# Patient Record
Sex: Male | Born: 1935 | Race: Black or African American | Hispanic: No | Marital: Married | Smoking: Former smoker
Health system: Southern US, Community
[De-identification: ages and names within clinical notes are randomized; demographics above are authoritative.]

## PROBLEM LIST (undated history)

## (undated) DIAGNOSIS — I2510 Atherosclerotic heart disease of native coronary artery without angina pectoris: Secondary | ICD-10-CM

## (undated) DIAGNOSIS — F329 Major depressive disorder, single episode, unspecified: Secondary | ICD-10-CM

## (undated) DIAGNOSIS — I151 Hypertension secondary to other renal disorders: Secondary | ICD-10-CM

## (undated) DIAGNOSIS — D179 Benign lipomatous neoplasm, unspecified: Secondary | ICD-10-CM

## (undated) DIAGNOSIS — E1151 Type 2 diabetes mellitus with diabetic peripheral angiopathy without gangrene: Secondary | ICD-10-CM

## (undated) DIAGNOSIS — K219 Gastro-esophageal reflux disease without esophagitis: Secondary | ICD-10-CM

## (undated) DIAGNOSIS — I10 Essential (primary) hypertension: Secondary | ICD-10-CM

## (undated) DIAGNOSIS — M1990 Unspecified osteoarthritis, unspecified site: Secondary | ICD-10-CM

## (undated) DIAGNOSIS — I701 Atherosclerosis of renal artery: Secondary | ICD-10-CM

## (undated) DIAGNOSIS — Z8719 Personal history of other diseases of the digestive system: Secondary | ICD-10-CM

## (undated) DIAGNOSIS — E538 Deficiency of other specified B group vitamins: Secondary | ICD-10-CM

## (undated) DIAGNOSIS — F32A Depression, unspecified: Secondary | ICD-10-CM

## (undated) DIAGNOSIS — K559 Vascular disorder of intestine, unspecified: Secondary | ICD-10-CM

## (undated) DIAGNOSIS — K279 Peptic ulcer, site unspecified, unspecified as acute or chronic, without hemorrhage or perforation: Secondary | ICD-10-CM

## (undated) DIAGNOSIS — J189 Pneumonia, unspecified organism: Secondary | ICD-10-CM

## (undated) DIAGNOSIS — E785 Hyperlipidemia, unspecified: Secondary | ICD-10-CM

## (undated) DIAGNOSIS — M8580 Other specified disorders of bone density and structure, unspecified site: Secondary | ICD-10-CM

## (undated) DIAGNOSIS — C61 Malignant neoplasm of prostate: Secondary | ICD-10-CM

## (undated) DIAGNOSIS — I5032 Chronic diastolic (congestive) heart failure: Secondary | ICD-10-CM

## (undated) DIAGNOSIS — N189 Chronic kidney disease, unspecified: Secondary | ICD-10-CM

## (undated) DIAGNOSIS — D469 Myelodysplastic syndrome, unspecified: Secondary | ICD-10-CM

## (undated) DIAGNOSIS — A0472 Enterocolitis due to Clostridium difficile, not specified as recurrent: Secondary | ICD-10-CM

## (undated) DIAGNOSIS — M109 Gout, unspecified: Secondary | ICD-10-CM

## (undated) DIAGNOSIS — I739 Peripheral vascular disease, unspecified: Secondary | ICD-10-CM

## (undated) DIAGNOSIS — I82409 Acute embolism and thrombosis of unspecified deep veins of unspecified lower extremity: Secondary | ICD-10-CM

## (undated) DIAGNOSIS — I495 Sick sinus syndrome: Secondary | ICD-10-CM

## (undated) DIAGNOSIS — K3184 Gastroparesis: Secondary | ICD-10-CM

## (undated) DIAGNOSIS — I4891 Unspecified atrial fibrillation: Secondary | ICD-10-CM

## (undated) DIAGNOSIS — Z950 Presence of cardiac pacemaker: Secondary | ICD-10-CM

## (undated) DIAGNOSIS — K648 Other hemorrhoids: Secondary | ICD-10-CM

## (undated) HISTORY — DX: Atherosclerotic heart disease of native coronary artery without angina pectoris: I25.10

## (undated) HISTORY — DX: Other hemorrhoids: K64.8

## (undated) HISTORY — PX: HIATAL HERNIA REPAIR: SHX195

## (undated) HISTORY — DX: Myelodysplastic syndrome, unspecified: D46.9

## (undated) HISTORY — DX: Deficiency of other specified B group vitamins: E53.8

## (undated) HISTORY — DX: Hyperlipidemia, unspecified: E78.5

## (undated) HISTORY — DX: Major depressive disorder, single episode, unspecified: F32.9

## (undated) HISTORY — DX: Chronic kidney disease, unspecified: N18.9

## (undated) HISTORY — DX: Atherosclerosis of renal artery: I70.1

## (undated) HISTORY — DX: Vascular disorder of intestine, unspecified: K55.9

## (undated) HISTORY — DX: Other specified disorders of bone density and structure, unspecified site: M85.80

## (undated) HISTORY — DX: Essential (primary) hypertension: I10

## (undated) HISTORY — DX: Unspecified atrial fibrillation: I48.91

## (undated) HISTORY — DX: Acute embolism and thrombosis of unspecified deep veins of unspecified lower extremity: I82.409

## (undated) HISTORY — DX: Sick sinus syndrome: I49.5

## (undated) HISTORY — DX: Chronic diastolic (congestive) heart failure: I50.32

## (undated) HISTORY — DX: Gastroparesis: K31.84

## (undated) HISTORY — DX: Gout, unspecified: M10.9

## (undated) HISTORY — DX: Malignant neoplasm of prostate: C61

## (undated) HISTORY — DX: Depression, unspecified: F32.A

## (undated) HISTORY — DX: Type 2 diabetes mellitus with diabetic peripheral angiopathy without gangrene: E11.51

## (undated) HISTORY — DX: Gastro-esophageal reflux disease without esophagitis: K21.9

## (undated) HISTORY — DX: Hypertension secondary to other renal disorders: I15.1

## (undated) HISTORY — DX: Peripheral vascular disease, unspecified: I73.9

## (undated) HISTORY — DX: Benign lipomatous neoplasm, unspecified: D17.9

## (undated) HISTORY — DX: Enterocolitis due to Clostridium difficile, not specified as recurrent: A04.72

---

## 1967-07-13 HISTORY — PX: PARTIAL GASTRECTOMY: SHX2172

## 1989-07-12 HISTORY — PX: APPENDECTOMY: SHX54

## 1993-01-22 HISTORY — PX: CORONARY ARTERY BYPASS GRAFT: SHX141

## 1994-10-18 HISTORY — PX: PACEMAKER INSERTION: SHX728

## 1995-07-13 DIAGNOSIS — C61 Malignant neoplasm of prostate: Secondary | ICD-10-CM

## 1995-07-13 HISTORY — DX: Malignant neoplasm of prostate: C61

## 1998-04-08 ENCOUNTER — Encounter: Payer: Self-pay | Admitting: Gastroenterology

## 1998-04-08 ENCOUNTER — Ambulatory Visit (HOSPITAL_COMMUNITY): Admission: RE | Admit: 1998-04-08 | Discharge: 1998-04-08 | Payer: Self-pay | Admitting: Gastroenterology

## 1998-07-30 ENCOUNTER — Encounter: Payer: Self-pay | Admitting: Gastroenterology

## 1998-07-30 ENCOUNTER — Ambulatory Visit (HOSPITAL_COMMUNITY): Admission: RE | Admit: 1998-07-30 | Discharge: 1998-07-30 | Payer: Self-pay | Admitting: Gastroenterology

## 1998-10-07 ENCOUNTER — Ambulatory Visit (HOSPITAL_COMMUNITY): Admission: RE | Admit: 1998-10-07 | Discharge: 1998-10-07 | Payer: Self-pay | Admitting: Cardiology

## 1998-10-07 ENCOUNTER — Encounter: Payer: Self-pay | Admitting: Cardiology

## 1999-05-07 ENCOUNTER — Ambulatory Visit: Admission: RE | Admit: 1999-05-07 | Discharge: 1999-05-07 | Payer: Self-pay | Admitting: Cardiology

## 1999-05-07 ENCOUNTER — Encounter: Payer: Self-pay | Admitting: Cardiology

## 2000-04-14 ENCOUNTER — Ambulatory Visit (HOSPITAL_COMMUNITY): Admission: RE | Admit: 2000-04-14 | Discharge: 2000-04-14 | Payer: Self-pay | Admitting: Cardiology

## 2000-04-14 ENCOUNTER — Encounter: Payer: Self-pay | Admitting: Cardiology

## 2000-04-21 ENCOUNTER — Ambulatory Visit (HOSPITAL_COMMUNITY): Admission: RE | Admit: 2000-04-21 | Discharge: 2000-04-21 | Payer: Self-pay | Admitting: Cardiology

## 2000-08-25 ENCOUNTER — Encounter: Admission: RE | Admit: 2000-08-25 | Discharge: 2000-08-25 | Payer: Self-pay | Admitting: Gastroenterology

## 2000-08-25 ENCOUNTER — Encounter: Payer: Self-pay | Admitting: Gastroenterology

## 2001-05-31 ENCOUNTER — Encounter: Admission: RE | Admit: 2001-05-31 | Discharge: 2001-08-29 | Payer: Self-pay | Admitting: Cardiology

## 2002-09-29 ENCOUNTER — Observation Stay (HOSPITAL_COMMUNITY): Admission: EM | Admit: 2002-09-29 | Discharge: 2002-09-30 | Payer: Self-pay | Admitting: Emergency Medicine

## 2002-09-29 ENCOUNTER — Encounter: Payer: Self-pay | Admitting: Emergency Medicine

## 2003-01-03 ENCOUNTER — Emergency Department (HOSPITAL_COMMUNITY): Admission: EM | Admit: 2003-01-03 | Discharge: 2003-01-03 | Payer: Self-pay | Admitting: Emergency Medicine

## 2003-01-08 ENCOUNTER — Ambulatory Visit (HOSPITAL_COMMUNITY): Admission: RE | Admit: 2003-01-08 | Discharge: 2003-01-08 | Payer: Self-pay | Admitting: Gastroenterology

## 2003-01-22 ENCOUNTER — Encounter: Payer: Self-pay | Admitting: Gastroenterology

## 2003-01-22 ENCOUNTER — Encounter: Admission: RE | Admit: 2003-01-22 | Discharge: 2003-01-22 | Payer: Self-pay | Admitting: Gastroenterology

## 2003-12-10 ENCOUNTER — Ambulatory Visit (HOSPITAL_COMMUNITY): Admission: RE | Admit: 2003-12-10 | Discharge: 2003-12-10 | Payer: Self-pay | Admitting: Cardiology

## 2003-12-10 HISTORY — PX: PACEMAKER GENERATOR CHANGE: SHX5998

## 2007-07-13 DIAGNOSIS — D179 Benign lipomatous neoplasm, unspecified: Secondary | ICD-10-CM

## 2007-07-13 HISTORY — DX: Benign lipomatous neoplasm, unspecified: D17.9

## 2008-02-07 ENCOUNTER — Ambulatory Visit (HOSPITAL_COMMUNITY): Admission: RE | Admit: 2008-02-07 | Discharge: 2008-02-07 | Payer: Self-pay | Admitting: Cardiology

## 2008-04-11 HISTORY — PX: CHOLECYSTECTOMY: SHX55

## 2008-04-16 ENCOUNTER — Encounter (INDEPENDENT_AMBULATORY_CARE_PROVIDER_SITE_OTHER): Payer: Self-pay | Admitting: Surgery

## 2008-04-16 ENCOUNTER — Ambulatory Visit (HOSPITAL_COMMUNITY): Admission: RE | Admit: 2008-04-16 | Discharge: 2008-04-18 | Payer: Self-pay | Admitting: Surgery

## 2009-03-06 ENCOUNTER — Encounter: Admission: RE | Admit: 2009-03-06 | Discharge: 2009-03-06 | Payer: Self-pay | Admitting: Family Medicine

## 2009-05-14 ENCOUNTER — Ambulatory Visit (HOSPITAL_COMMUNITY): Admission: RE | Admit: 2009-05-14 | Discharge: 2009-05-14 | Payer: Self-pay | Admitting: Urology

## 2009-06-20 ENCOUNTER — Ambulatory Visit (HOSPITAL_COMMUNITY): Admission: RE | Admit: 2009-06-20 | Discharge: 2009-06-20 | Payer: Self-pay | Admitting: Urology

## 2009-07-12 DIAGNOSIS — I82409 Acute embolism and thrombosis of unspecified deep veins of unspecified lower extremity: Secondary | ICD-10-CM

## 2009-07-12 HISTORY — DX: Acute embolism and thrombosis of unspecified deep veins of unspecified lower extremity: I82.409

## 2009-10-20 ENCOUNTER — Ambulatory Visit: Payer: Self-pay | Admitting: Diagnostic Radiology

## 2009-10-20 ENCOUNTER — Ambulatory Visit (HOSPITAL_BASED_OUTPATIENT_CLINIC_OR_DEPARTMENT_OTHER): Admission: RE | Admit: 2009-10-20 | Discharge: 2009-10-20 | Payer: Self-pay | Admitting: Emergency Medicine

## 2009-12-25 ENCOUNTER — Encounter: Admission: RE | Admit: 2009-12-25 | Discharge: 2009-12-25 | Payer: Self-pay | Admitting: Family Medicine

## 2010-02-02 ENCOUNTER — Ambulatory Visit: Payer: Self-pay | Admitting: Surgery

## 2010-02-02 ENCOUNTER — Inpatient Hospital Stay (HOSPITAL_COMMUNITY): Admission: RE | Admit: 2010-02-02 | Discharge: 2010-02-05 | Payer: Self-pay | Admitting: Surgery

## 2010-02-02 HISTORY — PX: THROMBECTOMY / EMBOLECTOMY SUBCLAVIAN ARTERY: SUR1355

## 2010-02-04 ENCOUNTER — Ambulatory Visit: Payer: Self-pay | Admitting: Surgery

## 2010-02-23 ENCOUNTER — Ambulatory Visit: Payer: Self-pay | Admitting: Surgery

## 2010-02-26 ENCOUNTER — Encounter: Admission: RE | Admit: 2010-02-26 | Discharge: 2010-02-26 | Payer: Self-pay | Admitting: Surgery

## 2010-09-26 LAB — CBC
HCT: 29.5 % — ABNORMAL LOW (ref 39.0–52.0)
Hemoglobin: 9.6 g/dL — ABNORMAL LOW (ref 13.0–17.0)
Hemoglobin: 9.8 g/dL — ABNORMAL LOW (ref 13.0–17.0)
MCH: 33.7 pg (ref 26.0–34.0)
MCH: 33.9 pg (ref 26.0–34.0)
MCH: 34.1 pg — ABNORMAL HIGH (ref 26.0–34.0)
MCHC: 33 g/dL (ref 30.0–36.0)
MCHC: 33.1 g/dL (ref 30.0–36.0)
MCHC: 33.3 g/dL (ref 30.0–36.0)
MCV: 102.8 fL — ABNORMAL HIGH (ref 78.0–100.0)
Platelets: 129 10*3/uL — ABNORMAL LOW (ref 150–400)
Platelets: 130 10*3/uL — ABNORMAL LOW (ref 150–400)
RDW: 14.7 % (ref 11.5–15.5)
RDW: 14.7 % (ref 11.5–15.5)
WBC: 8.3 10*3/uL (ref 4.0–10.5)

## 2010-09-26 LAB — GLUCOSE, CAPILLARY
Glucose-Capillary: 105 mg/dL — ABNORMAL HIGH (ref 70–99)
Glucose-Capillary: 111 mg/dL — ABNORMAL HIGH (ref 70–99)
Glucose-Capillary: 112 mg/dL — ABNORMAL HIGH (ref 70–99)
Glucose-Capillary: 114 mg/dL — ABNORMAL HIGH (ref 70–99)
Glucose-Capillary: 149 mg/dL — ABNORMAL HIGH (ref 70–99)
Glucose-Capillary: 171 mg/dL — ABNORMAL HIGH (ref 70–99)

## 2010-09-26 LAB — BASIC METABOLIC PANEL
BUN: 20 mg/dL (ref 6–23)
BUN: 5 mg/dL — ABNORMAL LOW (ref 6–23)
BUN: 5 mg/dL — ABNORMAL LOW (ref 6–23)
CO2: 26 mEq/L (ref 19–32)
CO2: 28 mEq/L (ref 19–32)
CO2: 29 mEq/L (ref 19–32)
Calcium: 8.4 mg/dL (ref 8.4–10.5)
Calcium: 8.8 mg/dL (ref 8.4–10.5)
Calcium: 8.9 mg/dL (ref 8.4–10.5)
Chloride: 109 mEq/L (ref 96–112)
Creatinine, Ser: 0.82 mg/dL (ref 0.4–1.5)
Creatinine, Ser: 1.18 mg/dL (ref 0.4–1.5)
Glucose, Bld: 119 mg/dL — ABNORMAL HIGH (ref 70–99)
Glucose, Bld: 124 mg/dL — ABNORMAL HIGH (ref 70–99)
Glucose, Bld: 138 mg/dL — ABNORMAL HIGH (ref 70–99)
Sodium: 141 mEq/L (ref 135–145)
Sodium: 142 mEq/L (ref 135–145)

## 2010-09-26 LAB — PROTIME-INR: INR: 1.12 (ref 0.00–1.49)

## 2010-09-26 LAB — HEPARIN LEVEL (UNFRACTIONATED)
Heparin Unfractionated: 0.25 IU/mL — ABNORMAL LOW (ref 0.30–0.70)
Heparin Unfractionated: 0.47 IU/mL (ref 0.30–0.70)
Heparin Unfractionated: 0.6 IU/mL (ref 0.30–0.70)

## 2010-11-24 NOTE — Op Note (Signed)
[name], [name]              ACCOUNT NO.:  [id number]   MEDICAL RECORD NO.:  [id number]          PATIENT TYPE:  OIB   LOCATION:  1615                         FACILITY:  [facility]   PHYSICIAN:  Ardeth Sportsman, MD     DATE OF BIRTH:  12/12/1935   DATE OF PROCEDURE:  04/16/2008  DATE OF DISCHARGE:                               OPERATIVE REPORT   CCS #:  [id number]   PRIMARY CARE PHYSICIAN:  Vikki Ports, M.D.   REFERRING PHYSICIAN:  Dr. Donnita Falls L. Randa Evens, M.D. with [facility].   SURGEON:  Ardeth Sportsman, MD.   ASSISTANT:  RN first assistant.   PREOPERATIVE DIAGNOSES:  1. Biliary colic, probable chronic cholecystitis.  2. Upper abdominal pain, question of incisional hernia status post      open paraesophageal hernia repair.  3. Umbilical hernia, symptomatic but reducible.   POSTOPERATIVE DIAGNOSES:  1. Biliary colic, probable chronic cholecystitis.  2. Upper midline intra-abdominal adhesions but no evidence of any      major incisional ventral hernia.  3. Umbilical hernia, symptomatic but reducible.   PROCEDURES PERFORMED:  1. Laparoscopic lysis of adhesions x45 minutes (half a case).  2. Laparoscopic cholecystectomy with intraoperative cholangiogram.  3. Primary umbilical hernia repair with 0 Vicryl figure of eight      stitch x2.   ANESTHESIA:  1. General anesthesia.  2. Local anesthetic and field block at all port sites.   SPECIMENS:  Gallbladder.   DRAINS:  None.   ESTIMATED BLOOD LOSS:  Less than 30 mL.   COMPLICATIONS:  None apparent.   INDICATIONS:  [name] is a pleasant 75 year old male who has had  worsening abdominal pain that seems to be biliary colic in some ways.  He has also had some upper abdominal pain that is related to his prior  large incision for his open paraesophageal hernia repair.   Anatomy and physiology of the digestive tract was explained as well as  hepatobiliary and pancreatic function was discussed.   Pathophysiology of  cholecystolithiasis and biliary dyskinesia with it's natural history and  risks were discussed.  Options were discussed.  Recommendations made for  diagnostic laparoscopy with lysis adhesions and a cholecystectomy with  an intraoperative cholangiogram.  Operative recommendation is also made  with lysis adhesions to make sure he did not have any evidence of  incisional hernias beyond his probable umbilical hernia as well.  Recommendation made if hernias existed to try and repair them if it  looked like a clean environment.  Risks, benefits, alternatives,  discussed. Questions answered and  he agreed to proceed.   OPERATIVE FINDINGS:  1. He had moderate adhesions of his omentum and mesocolon and some      duodenal adhesions to the gallbladder consistent with chronic      cholecystitis.  His cholangiogram showed a dilated common bile duct      but no evidence of any stones and good flush down to the duodenum      arguing against any obstruction.  2. He had moderate abdominal adhesions, primarily of omentum but  some      stomach and small bowel to his midline.  There is no evidence of      any supraumbilical midline incisional hernias.  3. He did have a 1.5 cm umbilical hernia incarcerated with omentum but      ultimately reducible.   DESCRIPTION OF PROCEDURE:  Informed consent was confirmed.  Patient  underwent general anesthesia without any difficulty.  He had a Foley  catheter sterilely placed.  He received IV  cephazolin just prior to  surgery.  He had sequential compression devices active during the entire  case.  He was positioned supine with both arms tucked.   A 5-0 port was placed in the right upper quadrant with the patient in  steep reverse Trendelenburg right side up using optical entry technique.  Camera inspection revealed no intraabdominal injury.  Under direct  visualization 5-0 port was placed in the right flank.  Camera inspection  revealed moderate  dense adhesions along the midline.  These were freed  up using a focused cautery and primarily cold sharp dissection scissors.  Once I freed all that I was able to free part of the liver off.  He did  have some adhesions of his stomach along the greater curvature of the  stomach to the anterior abdominal wall and I left those alone.  Left  lateral sphincter and his bladder had some mild adhesions I had freed  off to be sure to see all the way up the xyphoid.  There was no evidence  of any hernia along there except for  down at the base near the  umbilicus he did have an obvious hernia.  It had incarcerated with some  omentum though I was able to reduce it.   Attention was turned toward the cholecystectomy.  I went in and placed a  10 port through the umbilical hernia and another 10 port in the sub-  xyphoid region tunneled through the falciform ligament.   The gallbladder fundus was grasped near the cephalad.  Peritoneal  coverings for the anterior, medial and posterolateral walls of the  gallbladder and the liver was freed off.  Moderate mesorectal and  omental adhesions were freed off using careful blunt and focused cautery  dissection.  The duodenum was able to be swept off without having to use  any cautery.   Circumferential dissection is done on the proximal half of the  gallbladder to free it off.  A pulsatile structure going  to the  anteromedial wall and the smaller one going to the posterolateral wall  consistent with an anterior and posterior branch of cystic artery was  noted.  Further dissection was done to get a good critical view.  One  clip on the gallbladder side and 2 slightly proximal were placed on each  branch and they were transected.  I did not try and take the cystic node  with it.   This has left one structure going from the gallbladder down to the porta  hepatitis consistent with the cystic duct.  Two clips are placed in the  infundibulum and a partial  cystectomy was performed.  A 5 French  cholangiogram catheter was passed through right subcostal stab incision  flushed and placed in the cystic duct.  A cholangiogram was run using  dilute radio-opaque contrast and continuousfluoroscopy.  Contrast flowed  well from the side branch consistent with cystic duct cannulization.  The entry into the common bile duct was a little more distal than  average and seemed to pass anteriorly and have an insertion more on the  left side.  Contrast flowed well into a dilated common bile duct and  reflexed well into right and left intrahepatic chains.  Contrast easily  flowed down a smooth ampulla into the duodenum consistent with a normal  cholangiogram except for some mild common bile duct dilatation.  Cholangiocatheter was moved.  Four clips were placed on the cystic duct  slightly proximal to cystic ductotomy and cystic duct transection was  completed.   Gallbladder was freed of its remaining  attachments on liver bed are  removed in an EndoCatch bag.  During dissection there was some spillage  of bile but no stones.  When I pulled the bag the umbilical portit  had  a hernia within it and it came out rather easily.   Copious irrigation in over a liter of saline was done with clear return.  Hemostasis was good on the liver bed.  Bile was aspirated and washed out  well.  Clips on the cystic duct and cystic branch arteriole stumps were  intact.   The fascial defect of the subxyphoid region was large enough to allow my  pinky to pass.  I went in and used a laparoscopic suture passer to pass  a 0 Vicryl tie in a figure of eight fashion under direct visualization.  Capnoperitoneum was evacuated and ports were removed.  The umbilical  incision was opened a little more widely in a transverse curvilinear  fashion.  Borders of the  umbilical hernia were better defined.  The  hernia was closed using a 0 Vicryl figure of eight stitch x2 to good  result.  I  did not place mesh in since there had been some spillage of  bile and it was not larger than 2 cm.  I figured the risk of mesh  infection was greater than the risk of recurrent hernia.   Patient was extubated and sent to recovery room in stable condition.  I  discussed postoperative care with the patient.  I am about to discuss it  with his wife as well per his request.      Ardeth Sportsman, MD  Electronically Signed     SCG/MEDQ  D:  04/16/2008  T:  04/16/2008  Job:  [id number]   cc:   Vikki Ports, M.D.  Fax: [phone]   Llana Aliment. Malon Kindle., M.D.  Fax: [phone]   Anna Genre Little, M.D.  Fax: [phone]

## 2010-11-24 NOTE — Assessment & Plan Note (Signed)
OFFICE VISIT   PASCAL, STIGGERS  DOB:  March 01, 1936                                       02/23/2010  EAVWU#:[id number]   Mr. Pitera returns today with abdominal pain which was postprandial.  He has had a significant weight loss, approximately 70 pounds.  When I  saw him in the office, he had developed a swollen right arm.  We  admitted to the hospital.  A venogram the arm revealed a high-grade  stenosis at the end of pacemaker insertion site. Mechanical thrombectomy  was performed with AngioJet and the patient was placed on Coumadin.  He  has had significant improvement in right arm swelling.  Later that same  hospitalization, he went for arteriogram.  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  The patient had immediate improvement in his of postprandial  abdominal pain and was discharged home.  He comes back today because for  the last several days, he has had some mild pain with eating.  It does  not occur with every meal and the onset is a little bit later from his  meal than it was previously described.   On examination, his heart rate is 70, his blood pressure is 123/71.  His  temperature is 98.4.  He is well-appearing. In no distress.  His abdomen  is soft, somewhat tender to palpation in the right upper quadrant.  Respirations are nonlabored.   ASSESSMENT/PLAN:  1. Right arm swelling:  The patient was seen by Dr. Garnette Scheuermann, his      cardiologist while hospital.  He has elected to treat the stenosis      at his pacemaker insertion site with anticoagulation.  The patient      doing very well from this perspective at this time.  He has follow-      up with Dr. [name] this coming Wednesday.  2. Mesenteric stenosis:  Based on the patient's return of abdominal      pain although not quite as severe, I think this warrants further      diagnostic imaging.  I feel we get her best information  with a CT      angiogram.  I am going to have that performed this week and I will      contact the patient with the results.     Jorge Ny, MD  Electronically Signed   VWB/MEDQ  D:  02/23/2010  T:  02/24/2010  Job:  [id number]

## 2010-11-24 NOTE — Procedures (Signed)
DUPLEX DEEP VENOUS EXAM - UPPER EXTREMITY   INDICATION:  Pain, swelling.   HISTORY:  Edema:  Right arm.  Trauma/Surgery:  Right pacemaker.  Pain:  Yes.  PE:  No.  Previous DVT:  No.  Anticoagulants:  No.  Other:  No.   DUPLEX EXAM:                                             Bas/                IJV   SCV     AXV    BrachV  Ceph V                R  L  R   L   R  L   R   L   R  L  Thrombosis    o  o  +       +      +       +  Spontaneous   +  o  o       o      o       o  Phasic        +  +  o       o      o       o  Augmentation  +  +  o       o      o       o  Compressible  +  +  o       o      o       o  Competent     +  +  o       o      o       o  Legend:  + - yes  o - no  p - partial  D - decreased     IMPRESSION:  There appears to be thrombus noted in the right subclavian  vein, axillary vein, brachial vein, and basilic veins.   ___________________________________________  V. Charlena Cross, MD   CB/MEDQ  D:  02/02/2010  T:  02/02/2010  Job:  [id number]

## 2010-11-24 NOTE — Assessment & Plan Note (Signed)
OFFICE VISIT   NEWEL, OIEN D  DOB:  April 25, 1936                                       02/02/2010  JWJXB#:[id number]   REASON FOR VISIT:  Abdominal pain with eating.   HISTORY:  This is a 75 year old gentleman seen at request of Dr. Randa Evens  for evaluation of abdominal pain and weight loss.  The patient states  that for the past 6 months he has been having significant abdominal pain  after every meal.  Sometimes he states that this occurs immediately  after eating but always within the hour.  I get mixed results when  asking about his weight loss.  His family members state that he has lost  approximately 70 pounds.  The patient does not think it has been this  much.  However, his  appetite has been significantly limited.  He has  recently undergone a CT angiogram which showed atherosclerotic plaque at  the origin of his celiac and superior mesenteric artery.  There is also  stenosis at the origin of inferior mesenteric artery.  Patient has  undergone a colonoscopy in 2008 for heme positive stools which was  negative except for internal hemorrhoids.  The EGD showed a small hiatal  hernia but it was otherwise normal.   The patient's risk factors for atherosclerotic disease include diabetes  which he has had since age 39.  He also suffers hypertension and  hypercholesterolemia which are medically managed.  He does have a  pacemaker/AICD on the right.   The patient also complains today of swelling in his right arm which has  been present for the past 3 or 4 days.   REVIEW OF SYSTEMS:  GENERAL:  Positive weight loss as well as loss of  appetite.  Current weight is 168 pounds.  VASCULAR:  Positive for pain in legs with walking.  CARDIAC:  Positive for chest pain and shortness of breath with exertion.  GI:  Positive for abdominal pain.  NEURO:  Positive dizziness and headaches.  PULMONARY:  Negative.  HEME:  Positive for anemia.  GU is negative.  EENT:   Negative.  MUSCULOSKELETAL:  Positive for arthritis and muscle pain.  PSYCH:  Negative.  SKIN:  Negative.   PAST MEDICAL HISTORY:  Diabetes, hypertension, hypercholesterolemia,  congestive heart failure, prostate cancer, chronic abdominal pain, renal  insufficiency,   SOCIAL HISTORY:  He is married with 7 children.  Does not smoke, quit 10  years ago, does not drink.   FAMILY HISTORY:  Negative for cardiovascular disease at an early age.   ALLERGIES:  Ibuprofen.   PHYSICAL EXAMINATION:  Heart rate 88, blood pressure 127/80, O2 sats  98%.  GENERAL:  This is a cachectic-appearing man in no acute distress.  HEENT:  Within normal limits.  LUNGS:  Clear bilaterally.  CARDIOVASCULAR:  Regular rate and rhythm.  No carotid bruits.  ABDOMEN:  Soft with minimal tenderness.  No hernias.  No  hepatosplenomegaly.  MUSCULOSKELETAL:  Without major deformities except for swelling in the  entire right upper arm.  NEURO:  Without focal deficits.  SKIN:  No rash.   DIAGNOSTICS:  I have ordered and independently reviewed his right upper  arm ultrasound which shows a thrombosis in the right subclavian,  axillary, brachial and basilic veins.   ASSESSMENT/PLAN:  1. Right arm swelling:  I have spoke with  Dr. Irish Lack.  This      most likely represents subclavian vein thrombosis potentially from      the patient's existing pacemaker catheter.  Due to his significant      amount swelling, I would recommend proceeding with mechanical      thrombectomy and/or  thrombolysis, which should be at the      discretion of Dr. Fredia Sorrow who has agreed to accommodate this      patient today.  For that reason, the patient will be sent hospital      to have this addressed.  2. Abdominal pain:  Based on the patient's CT scan, chronic mesenteric      ischemia most likely is the etiology for his abdominal pain.  I      will plan on proceeding with abdominal angiography and possible      stenting of his  superior mesenteric artery once the subclavian vein      thrombosis has been resolved.  This could potentially be done this      week pending those results.  I will plan on accessing the left      groin as I have difficulty finding a pulse of the right groin.  The      risks and benefits of  percutaneous intervention of the mesenteric      vessels were discussed with the patient which included the need for      multiple dilations or possibly need for operative repair.     Jorge Ny, MD  Electronically Signed   VWB/MEDQ  D:  02/02/2010  T:  02/03/2010  Job:  [id number]

## 2010-11-27 NOTE — Op Note (Signed)
   NAME:  [name], [name]                     ACCOUNT NO.:  [id number]   MEDICAL RECORD NO.:  [id number]                   PATIENT TYPE:  AMB   LOCATION:  ENDO                                 FACILITY:  [facility]   PHYSICIAN:  James L. Malon Kindle., M.D.          DATE OF BIRTH:  1935-11-06   DATE OF PROCEDURE:  01/08/2003  DATE OF DISCHARGE:                                 OPERATIVE REPORT   PROCEDURE:  Colonoscopy.   MEDICATIONS:  Fentanyl 100 mcg and Versed 10 mg IV.   SCOPE:  Adult Olympus colonoscope.   INDICATIONS:  Colon cancer screening in an average risk individual.   DESCRIPTION OF PROCEDURE:  The patient had been explained to the patient and  consent obtained.  With the patient in the left lateral decubitus position,  the Olympus colonoscope was inserted and advanced.  We were able to advance  eventually to the cecum using abdominal pressure and position changes.  The  patient had a long tortuous colon.  The prep was somewhat poor, but we felt  that we got an adequate look.  There were no polyps seen.  Scattered  diverticula.  The scope was withdrawn.  The patient tolerated the procedure  well.  Maintained on low flow oxygen and pulse oximetry throughout the  procedure.   ASSESSMENT:  Normal screening colonoscopy.   PLAN:  Yearly Hemoccult.  Consider another colonoscopy in 5-10 years.                                               James L. Malon Kindle., M.D.    Waldron Session  D:  01/08/2003  T:  01/08/2003  Job:  [id number]   cc:   Dellis Anes. Idell Pickles, M.D.  [address]  [place]  [place] [address]  Fax: [phone]

## 2010-11-27 NOTE — Cardiovascular Report (Signed)
. [facility]  Patient:    [name], [name]                  MRN: [id number] Proc. Date: 04/21/00 Adm. Date:  [id number] Attending:  Silvestre Mesi CC:         Cardiac Catheterization Laboratory   Cardiac Catheterization  PROCEDURES PERFORMED: 1. Left heart catheterization. 2. Coronary cineangiography. 3. Vein graft cineangiography. 4. Left internal mammary artery cineangiography. 5. Left ventricular cineangiography. 6. Abdominal aortogram. 7. Perclose of right femoral artery.  INDICATIONS:  This 75 year old male has a history of coronary artery disease, status post single-vessel coronary artery bypass graft surgery to his right coronary artery.  He also has a history of hypertension and severe peripheral vessel disease with claudication.  He recently had acceleration in his angina and a rest stress Cardiolite showed evidence of ischemia at his left ventricular apex.  DESCRIPTION OF PROCEDURE:  After signing an informed consent, the patient was premedicated with 50% of Benadryl intravenously and brought to the cardiac catheterization laboratory at [facility].  His right groin was prepped and draped in a sterile fashion and anesthetized locally with 1% lidocaine.  A 6-French introducer sheath was inserted percutaneously into the right femoral artery.  Six [place] #4 Judkins coronary catheters were used to make injections into the native coronary arteries.  The right coronary catheter was used to make injections into the right coronary artery vein graft with good visualization.  The right coronary catheter was also used to make a midstream injection into the left subclavian artery, visualizing the left internal mammary artery.  A 6-French pigtail catheter was used to measure pressures in the left ventricle and aorta and to make a midstream injection into the left ventricle and abdominal aorta.  The patient tolerated the procedure  well and no complications were noted.  At the end of the procedure, the catheter and sheath were removed from the right femoral artery and hemostasis was easily obtained with a Perclose closure system.  MEDICATIONS GIVEN:  None.  HEMODYNAMIC DATA:  Left ventricular pressure 188/20-43.  Aortic pressure 190/106, with a mean of 136.  Left ventricular ejection fraction 60%.  CINE FINDINGS:  CORONARY CINEANGIOGRAPHY: 1. Left coronary artery:  The ostium, and left main appear normal.  At the    bifurcation, there is a concentric focal lesion, which appears to be    approximately 20% to 30% stenotic.  This appears unchanged from his prior    studies. 2. Left anterior descending:  The LAD has a mild plaque in its middle segment    which is less than 20% stenotic.  The distal LAD appears normal. 3. Circumflex coronary artery:  The circumflex coronary artery is mildly    irregular throughout the proximal and middle segment.  It has good    antegrade flow and there is no apparent significant stenotic lesion. 4. Right coronary artery:  The right coronary artery is totally occluded in    its middle segment and the distal segment fills by way of a sequential    vein graft.  VEIN GRAFT CINEANGIOGRAPHY: 1. Right coronary artery sequential vein graft:  This vein graft appears    normal, with normal appearing anastomotic sites and very good flow into    his distal right coronary artery system. 2. Left internal mammary artery:  Appears normal.  LEFT VENTRICULAR CINEANGIOGRAM:  The left ventricular chamber size is normal to mildly enlarged.  The overall left ventricular  contractility appears normal ejection fraction measured at 60%.  There is mild anterior hypokinesia.  ABDOMINAL AORTOGRAM:  The abdominal aorta has diffuse irregular atherosclerotic plaque which is nonobstructive and there is no evidence for aneurysm.  The renal arteries appear normal.  FINAL DIAGNOSES: 1. Very good appearance of  the vein graft to his right coronary artery. 2. Mild stenosis of the left main bifurcation with proximal LAD at the ostium    with 20% to 30% lesion, unchanged from prior studies. 3. Normal distal left anterior descending in the area of the abnormal    Cardiolite. 4. Irregular circumflex without stenosis and with normal flow. 5. Normal overall left ventricular function.  Ejection fraction is 60%, with    mild anterior hypokinesia. 6. Normal left internal mammary artery. 7. Nonobstructive atherosclerosis of the abdominal aorta. 8. Successful Perclose of the right femoral artery.  DISPOSITION:  The patient was returned to the short-stay unit for discharge today.  We will follow him medically in the office. DD:  04/21/00 TD:  04/21/00 Job: [id number]

## 2010-11-27 NOTE — Cardiovascular Report (Signed)
[name], [name]                          ACCOUNT NO.:  [id number]   MEDICAL RECORD NO.:  [id number]                   PATIENT TYPE:  OIB   LOCATION:  2899                                 FACILITY:  [facility]   PHYSICIAN:  Aram Candela. Tysinger, M.D.              DATE OF BIRTH:  December 18, 1935   DATE OF PROCEDURE:  12/10/2003  DATE OF DISCHARGE:                              CARDIAC CATHETERIZATION   PROCEDURES:  Pacemaker pulse generator replacement.   INDICATION FOR PROCEDURES:  The elective replacement indicator has been  reached.   PROCEDURE:  After signing an informed consent, the patient was premedicated  with 5 mg of Valium by mouth and brought to the cardiac catheterization lab  electrophysiology room where his right anterior chest and base of neck were  prepped and draped in sterile fashion and a right transverse plane was  anesthetized locally overlying the existing pulse generator.  An incision  was made in this anesthetized plane with the incision being deepened into  the fibrous layer overlying the pulse generator.  Fibrous layer was then  incised exposing the pulse generator which was removed from the pocket.  After removing the pulse generator from its leads, the leads were analyzed  finding very good pacing parameters on the atrial lead and acceptable  parameters on the ventricular lead.  In the atrial lead the minimal voltage  threshold was 0.08 V utilizing 1.6 milliamps of current.  Resistance was  measured at 426 ohms and the P wave sensitivity was measured at 3.8 mV.  In  the ventricular lead, the minimum voltage threshold was 1.6 V utilizing 2.5  milliamps of current.  The resistance was measured at 571 ohms and the R  wave sensitivity was measured at greater than 12.5 mV.  After obtaining  these pacing parameters, we then lavaged the pocket profusely with kanamycin  solution.  A new pulse generator was selected made by [employer];  model Identity XLDR, model  number [id number], serial number [id number].  After  properly analyzing the pulse generator, it was attached to the existing  leads in the usual fashion.  Pulse generator was then placed within the  existing pocket and the wound was closed in layers using 2.0 Dexon.  Final  skin closure was obtained with a cutaneous layer of Steri-Strips.  The  patient tolerated the procedure well and no complications were noted.  At  the end of the procedure a sterile bulky dressing is applied to the wound  and he was returned to the short stay unit for further monitoring and one  further dose of Ancef prior to discharge home in six to eight hours.  He was  given wound care instructions for removal of the outer bandage in the a.m.  and painting over the Steri-Strips with Betadine only once.  He is then to  follow [place] up in the office in one week to analyze his  pacemaker and to check  his wound.                                               [name] R. [name], M.D.    JRT/MEDQ  D:  12/10/2003  T:  12/10/2003  Job:  [id number]

## 2010-11-27 NOTE — Discharge Summary (Signed)
NAME:  [name], [name]                     ACCOUNT NO.:  [id number]   MEDICAL RECORD NO.:  [id number]                   PATIENT TYPE:  OBV   LOCATION:  5502                                 FACILITY:  [facility]   PHYSICIAN:  Sherin Quarry, MD                   DATE OF BIRTH:  [birth year]   DATE OF ADMISSION:  09/29/2002  DATE OF DISCHARGE:  09/30/2002                                 DISCHARGE SUMMARY   HOSPITAL COURSE:  The patient presented to the [facility] Emergency room on  March 20, with a history of right shoulder and right scapular pain which  seemed to radiate through to the front of the chest.  This was not  pleuritic.  The patient was mildly dyspneic and nauseated and found that the  pain was aggravated by raising his arm.  Of note is that the patient has a  history of coronary artery bypass graft and underwent cardiac  catheterization in October 2001, which showed that the vein graft to his  right coronary artery was functioning well.  It also showed mild diffuse  disease and normal ejection fraction.  The patient was admitted by Dr.  Soyla Dryer.  Her physical exam was remarkable only for tenderness over the  right scapula associated with a limited range of motion of the right arm  consistent with a musculoskeletal strain or pull.  Relevant studies obtained  included lipase which was normal.  CMP was remarkable only for a glucose of  189.  CBC revealed a hemoglobin of 10.6.  Serial cardiac enzymes were  negative.  Urinalysis was within normal limits.  A chest x-ray was obtained  that showed evidence of cardiomegaly.  Because of concern about a possible  aortic dissection, a CT scan of the chest was done.  This showed bibasilar  atelectasis, right greater than left, with no evidence of aortic dissection  or aneurysm.  The patient was noted to be status CABG and have a pacemaker  present.  By the a.m. of March 21, the patient seemed to be feeling better.  He was still stiff and  still noticed some discomfort in his right scapular  area when he would move; however, he said the pain was better.  At that  point, I advised him that it would be acceptable for him to go home.  He was  very agreeable with this idea.   DISCHARGE DIAGNOSES:  1. Chest pain, secondary to muscular strain.  2. Diabetes.  3. Hypertension.  4. Coronary artery disease, status post coronary artery bypass graft.  5. Gastroesophageal reflux.   DISCHARGE MEDICATIONS:  1. Prevacid 30 mg daily.  2. Actos 45 mg daily.  3. Amaryl 1 mg daily.  4. Aspirin 325 mg daily.  5. Avapro 300 mg daily.  6. Zocor 40 mg daily.  7. Tiazac 30 mg daily.  8. Flexeril 25 mg p.r.n. for muscle strain.  9.  Zithromax 250 mg q.d. x4 days because of a mild productive cough.  10.      Vicodin 5/500 #25 one to two every four hours p.r.n. pain.  11.      As his potassium was noted to be 3.0 on admission, I gave him Kay     Ciel 10 mEq b.i.d.    FOLLOW UP:  He was instructed to return to see Dr. Idell Pickles in approximately  seven days.   CONDITION ON DISCHARGE:  Fair.                                               Sherin Quarry, MD    SY/MEDQ  D:  09/30/2002  T:  10/01/2002  Job:  [id number]   cc:   Dellis Anes. Idell Pickles, M.D.  [address]  [place]  [place] [address]  Fax: [phone]

## 2011-01-23 ENCOUNTER — Emergency Department (HOSPITAL_COMMUNITY)
Admission: EM | Admit: 2011-01-23 | Discharge: 2011-01-23 | Disposition: A | Discharge status: Home / Self Care | Payer: Medicare Other | Attending: Emergency Medicine | Admitting: Emergency Medicine

## 2011-01-23 DIAGNOSIS — E119 Type 2 diabetes mellitus without complications: Secondary | ICD-10-CM | POA: Insufficient documentation

## 2011-01-23 DIAGNOSIS — Z7901 Long term (current) use of anticoagulants: Secondary | ICD-10-CM | POA: Insufficient documentation

## 2011-01-23 DIAGNOSIS — I739 Peripheral vascular disease, unspecified: Secondary | ICD-10-CM | POA: Insufficient documentation

## 2011-01-23 DIAGNOSIS — E785 Hyperlipidemia, unspecified: Secondary | ICD-10-CM | POA: Insufficient documentation

## 2011-01-23 DIAGNOSIS — R042 Hemoptysis: Secondary | ICD-10-CM | POA: Insufficient documentation

## 2011-01-23 DIAGNOSIS — K0381 Cracked tooth: Secondary | ICD-10-CM | POA: Insufficient documentation

## 2011-01-23 DIAGNOSIS — I10 Essential (primary) hypertension: Secondary | ICD-10-CM | POA: Insufficient documentation

## 2011-01-23 DIAGNOSIS — Z79899 Other long term (current) drug therapy: Secondary | ICD-10-CM | POA: Insufficient documentation

## 2011-01-23 DIAGNOSIS — Z7982 Long term (current) use of aspirin: Secondary | ICD-10-CM | POA: Insufficient documentation

## 2011-01-23 DIAGNOSIS — K055 Other periodontal diseases: Secondary | ICD-10-CM | POA: Insufficient documentation

## 2011-01-23 DIAGNOSIS — R109 Unspecified abdominal pain: Secondary | ICD-10-CM | POA: Insufficient documentation

## 2011-01-23 DIAGNOSIS — I2510 Atherosclerotic heart disease of native coronary artery without angina pectoris: Secondary | ICD-10-CM | POA: Insufficient documentation

## 2011-01-23 DIAGNOSIS — Z86718 Personal history of other venous thrombosis and embolism: Secondary | ICD-10-CM | POA: Insufficient documentation

## 2011-01-23 DIAGNOSIS — Z8546 Personal history of malignant neoplasm of prostate: Secondary | ICD-10-CM | POA: Insufficient documentation

## 2011-01-23 DIAGNOSIS — D649 Anemia, unspecified: Secondary | ICD-10-CM | POA: Insufficient documentation

## 2011-02-17 ENCOUNTER — Ambulatory Visit (HOSPITAL_COMMUNITY): Payer: Medicare Other | Attending: Gastroenterology

## 2011-02-17 DIAGNOSIS — D509 Iron deficiency anemia, unspecified: Secondary | ICD-10-CM | POA: Insufficient documentation

## 2011-03-03 ENCOUNTER — Encounter (HOSPITAL_COMMUNITY)
Admission: RE | Admit: 2011-03-03 | Discharge: 2011-03-03 | Disposition: A | Discharge status: Home / Self Care | Payer: Medicare Other | Source: Ambulatory Visit | Attending: Gastroenterology | Admitting: Gastroenterology

## 2011-03-03 DIAGNOSIS — D509 Iron deficiency anemia, unspecified: Secondary | ICD-10-CM | POA: Insufficient documentation

## 2011-04-02 ENCOUNTER — Other Ambulatory Visit: Payer: Self-pay

## 2011-04-02 DIAGNOSIS — I739 Peripheral vascular disease, unspecified: Secondary | ICD-10-CM

## 2011-04-12 LAB — GLUCOSE, CAPILLARY
Glucose-Capillary: 115 — ABNORMAL HIGH
Glucose-Capillary: 139 — ABNORMAL HIGH
Glucose-Capillary: 157 — ABNORMAL HIGH
Glucose-Capillary: 168 — ABNORMAL HIGH
Glucose-Capillary: 81

## 2011-04-12 LAB — BASIC METABOLIC PANEL
Calcium: 9.6
GFR calc Af Amer: >60
GFR calc non Af Amer: >60
Glucose, Bld: 160 — ABNORMAL HIGH
Sodium: 143

## 2011-05-12 ENCOUNTER — Encounter: Payer: Self-pay | Admitting: Surgery

## 2011-05-14 ENCOUNTER — Encounter: Payer: Self-pay | Admitting: Surgery

## 2011-05-17 ENCOUNTER — Ambulatory Visit (INDEPENDENT_AMBULATORY_CARE_PROVIDER_SITE_OTHER): Payer: Medicare Other | Admitting: Surgery

## 2011-05-17 ENCOUNTER — Other Ambulatory Visit (INDEPENDENT_AMBULATORY_CARE_PROVIDER_SITE_OTHER): Payer: Medicare Other | Admitting: *Deleted

## 2011-05-17 ENCOUNTER — Encounter: Payer: Medicare Other | Admitting: Surgery

## 2011-05-17 ENCOUNTER — Encounter: Payer: Self-pay | Admitting: Surgery

## 2011-05-17 VITALS — BP 147/69 | HR 61 | Resp 16 | Ht 70.0 in | Wt 166.0 lb

## 2011-05-17 DIAGNOSIS — I739 Peripheral vascular disease, unspecified: Secondary | ICD-10-CM

## 2011-05-17 DIAGNOSIS — I70219 Atherosclerosis of native arteries of extremities with intermittent claudication, unspecified extremity: Secondary | ICD-10-CM

## 2011-05-17 NOTE — Progress Notes (Signed)
V[facility] and [facility]in Specialist of    Patient name: [name] MRN: [id number] DOB: 11/09/1935 Sex: male   Referred by: Dr. Hank Smith   Reason for referral:  Chief Complaint  Patient presents with  . PVD    Eval for claudication symptoms  REF-> Dr. Henry Smith,  pt c/o cramping in  his legs.    HISTORY OF PRESENT ILLNESS: I am seeing the patient today for evaluation of bilateral lower stomach claudication, left greater than right. This is a new problem I am treating of his. He has a history of undergoing mesenteric stenting for ischemic symptoms. He has also lost approximately 70 pounds. He underwent stenting of both the superior mesenteric as well as his celiac artery in July of 2011 this was done using a 6 mm Herculink balloon expandable stents. He does not endorse any further abdominal complaints. He is here today because of lower extremity cramping. He states that both legs give him problems especially when he is on his treadmill at about 10 minutes of walking. He describes his left calf it is beginning to her first is followed by the right. He begins to feel unsteady as if his laser to get out he is going to fall. This is severely lifestyle limiting for him. There are no aggravating factors other than exercise. Rest is beneficial for improvement of his symptoms. He denies nonhealing wounds or ulcerations.  The patient is maintained on Coumadin. At the time of his mesenteric issues he developed swelling in his left arm and was found to have a stenosis at his pacemaker insertion site he developed a subclavian vein thrombosis which was initially treated with angioplasty in getting the vein open he's been on Coumadin ever since he does not have any more issues with his arm. Patient office also suffers and diabetes hypertension and hypercholesterolemia all of which are medically managed. He does have mild renal insufficiency but has not required dialysis.  Past Medical History    Diagnosis Date  . Diabetes mellitus   . CAD (coronary artery disease)   . Hypertension   . Anemia   . Peripheral vascular disease   . Hyperlipidemia   . Cancer     prostate  . Ulcer   . GERD (gastroesophageal reflux disease)   . Liddle's syndrome   . Hemorrhoids   . Intestinal ischemia   . DVT (deep venous thrombosis)   . Atrial fibrillation     Past Surgical History  Procedure Date  . Coronary artery bypass graft 1994  . Hiatal hernia repair     and ulcer repair  . Appendectomy   . Cholecystectomy     History   Social History  . Marital Status: Married    Spouse Name: N/A    Number of Children: N/A  . Years of Education: N/A   Occupational History  . Not on file.   Social History Main Topics  . Smoking status: Former Smoker    Types: Cigarettes    Quit date: 07/12/1977  . Smokeless tobacco: Not on file  . Alcohol Use: No  . Drug Use: No  . Sexually Active:    Other Topics Concern  . Not on file   Social History Narrative  . No narrative on file    History reviewed. No pertinent family history.  Allergies as of 05/17/2011 - Review Complete 05/17/2011  Allergen Reaction Noted  . Ace inhibitors Cough 05/12/2011  . Ibuprofen  05/12/2011    Current Outpatient Prescriptions   on File Prior to Visit  Medication Sig Dispense Refill  . acetaminophen (ARTHRITIS PAIN RELIEF) 650 MG CR tablet Take 1,300 mg by mouth 2 (two) times daily.        . amLODipine (NORVASC) 5 MG tablet Take 5 mg by mouth daily.        . aspirin EC 81 MG tablet Take 81 mg by mouth daily.        . atorvastatin (LIPITOR) 40 MG tablet Take 40 mg by mouth daily.        . CALCIUM PO Take 2,000 mg by mouth daily.        . carbonyl iron (FEOSOL) 45 MG TABS Take 45 mg by mouth 2 (two) times daily with a meal.        . cholecalciferol (VITAMIN D) 1000 UNITS tablet Take 2,000 Units by mouth daily.       . cloNIDine (CATAPRES) 0.2 MG tablet Take 0.2 mg by mouth 2 (two) times daily.        .  diltiazem (CARDIZEM CD) 180 MG 24 hr capsule Take 360 mg by mouth daily.        . furosemide (LASIX) 80 MG tablet Take 40 mg by mouth daily.       . Leuprolide Acetate (LUPRON IJ) Inject as directed.        . losartan (COZAAR) 100 MG tablet Take 100 mg by mouth daily.        . metFORMIN (GLUCOPHAGE) 500 MG tablet Take 1,000 mg by mouth 2 (two) times daily with a meal.        . nitroGLYCERIN (NITROSTAT) 0.4 MG SL tablet Place 0.4 mg under the tongue every 5 (five) minutes as needed.        . omeprazole (PRILOSEC) 20 MG capsule Take 20 mg by mouth daily.        . Pancrelipase, Lip-Prot-Amyl, 5000 UNITS CPEP Take 2 capsules by mouth 3 (three) times daily.        . potassium chloride (KLOR-CON) 20 MEQ packet Take 20 mEq by mouth 3 (three) times daily.        . warfarin (COUMADIN) 5 MG tablet Take 5 mg by mouth daily. Take 5 mg Tuesday, Thursday and Sunday. 7.5 mg Monday, Wednesday, Friday and Saturday          REVIEW OF SYSTEMS: Cardiovascular: Positive for chest pain, chest pressure, palpitations, and dyspnea on exertion, claudication.  Negative for  rest pain,  No history of DVT or phlebitis. Pulmonary: No productive cough, asthma or wheezing. Neurologic: Positive for dizziness and headaches Hematologic: Positive for anemia Musculoskeletal: Positive for arthritis joint pain or muscle pain Gastrointestinal: No blood in stool or hematemesis Genitourinary: No dysuria or hematuria. Positive for frequent urination Psychiatric:: No history of major depression. Integumentary: No rashes or ulcers. Constitutional: No fever or chills.  PHYSICAL EXAMINATION: General: The patient appears their stated age.  Vital signs are BP 147/69  Pulse 61  Resp 16  Ht 5' 10" (1.778 m)  Wt 166 lb (75.297 kg)  BMI 23.82 kg/m2  SpO2 98% Pulmonary: Respirations are non-labored Abdomen: Soft and non-tender with normal pitch bowel sounds. Musculoskeletal: There are no major deformities.   Neurologic: No focal  weakness or paresthesias are detected, Skin: There are no ulcer or rashes noted. Psychiatric: The patient has normal affect. Cardiovascular: There is a regular rate and rhythm without significant murmur appreciated. Pedal pulses are nonpalpable he does have palpable femoral  pulses Diagnostic Studies: Ultrasound was   ordered and reviewed today this shows an ABI of 0.97 on the right however waveforms are monophasic. On the left the ABIs port is 0.75 also with monophasic waveforms   Medication Changes:  the patient's Coumadin will be stopped 5 days prior to his angiogram. I am going to bridge him with Lovenox   Assessment:   bilateral lifestyle limiting claudication, mesenteric stenosis  Plan:  attending the next course of action is to proceed with angiography. At the time of his angiogram since he has not had any followup ultrasounds for his mesenteric stent I would like to perform a mesenteric angiogram to make sure that he does not have in-stent stenosis. Did tell them that if he does have in-stent stenosis of the treated that time. Simultaneously L. proceed with an abdominal aortogram with bilateral runoff and possible intervention if need necessary. Access to be in the right groin initially. This is been scheduled for Tuesday, November 13. I elected to bridge with Lovenox and stopped his Coumadin 5 days prior to the procedure.      V. Wells Delisa Finck IV, M.D. [facility] [place] Office: [phone]    

## 2011-05-18 ENCOUNTER — Other Ambulatory Visit: Payer: Self-pay | Admitting: *Deleted

## 2011-05-21 ENCOUNTER — Encounter (HOSPITAL_COMMUNITY): Payer: Self-pay | Admitting: Pharmacy Technician

## 2011-05-24 MED ORDER — SODIUM CHLORIDE 0.9 % IV SOLN
INTRAVENOUS | Status: DC
  Administered 2011-05-25: 10:00:00 via INTRAVENOUS

## 2011-05-25 ENCOUNTER — Other Ambulatory Visit: Payer: Self-pay

## 2011-05-25 ENCOUNTER — Encounter (HOSPITAL_COMMUNITY): Payer: Self-pay

## 2011-05-25 ENCOUNTER — Ambulatory Visit (HOSPITAL_COMMUNITY)
Admission: RE | Admit: 2011-05-25 | Discharge: 2011-05-25 | Disposition: A | Discharge status: Home / Self Care | Payer: Medicare Other | Source: Ambulatory Visit | Attending: Surgery | Admitting: Surgery

## 2011-05-25 ENCOUNTER — Encounter (HOSPITAL_COMMUNITY)
Admission: RE | Disposition: A | Discharge status: Home / Self Care | Payer: Self-pay | Source: Ambulatory Visit | Attending: Surgery

## 2011-05-25 DIAGNOSIS — I10 Essential (primary) hypertension: Secondary | ICD-10-CM | POA: Insufficient documentation

## 2011-05-25 DIAGNOSIS — I70219 Atherosclerosis of native arteries of extremities with intermittent claudication, unspecified extremity: Secondary | ICD-10-CM | POA: Insufficient documentation

## 2011-05-25 DIAGNOSIS — K551 Chronic vascular disorders of intestine: Secondary | ICD-10-CM

## 2011-05-25 DIAGNOSIS — E119 Type 2 diabetes mellitus without complications: Secondary | ICD-10-CM | POA: Insufficient documentation

## 2011-05-25 DIAGNOSIS — E785 Hyperlipidemia, unspecified: Secondary | ICD-10-CM | POA: Insufficient documentation

## 2011-05-25 DIAGNOSIS — Z7901 Long term (current) use of anticoagulants: Secondary | ICD-10-CM | POA: Insufficient documentation

## 2011-05-25 HISTORY — PX: VISCERAL ANGIOGRAM: SHX5515

## 2011-05-25 HISTORY — PX: ABDOMINAL ANGIOGRAM: SHX5499

## 2011-05-25 HISTORY — PX: LOWER EXTREMITY ANGIOGRAM: SHX5508

## 2011-05-25 LAB — GLUCOSE, CAPILLARY: Glucose-Capillary: 91 mg/dL (ref 70–99)

## 2011-05-25 SURGERY — VISCERAL ANGIOGRAM
Anesthesia: LOCAL

## 2011-05-25 SURGERY — Surgical Case
Anesthesia: *Unknown

## 2011-05-25 MED ORDER — METOPROLOL TARTRATE 1 MG/ML IV SOLN: 2.0000 mg | INTRAVENOUS | Status: DC | PRN

## 2011-05-25 MED ORDER — HYDRALAZINE HCL 20 MG/ML IJ SOLN: 10.0000 mg | INTRAMUSCULAR | Status: DC | PRN

## 2011-05-25 MED ORDER — FAMOTIDINE IN NACL 20-0.9 MG/50ML-% IV SOLN: 20.0000 mg | Freq: Two times a day (BID) | INTRAVENOUS | Status: DC

## 2011-05-25 MED ORDER — ACETAMINOPHEN 325 MG RE SUPP: 325.0000 mg | RECTAL | Status: DC | PRN

## 2011-05-25 MED ORDER — LIDOCAINE HCL (PF) 1 % IJ SOLN
INTRAMUSCULAR | Status: AC
  Filled 2011-05-25: qty 30

## 2011-05-25 MED ORDER — ALUM & MAG HYDROXIDE-SIMETH 400-400-40 MG/5ML PO SUSP: 15.0000 mL | ORAL | Status: DC | PRN

## 2011-05-25 MED ORDER — HEPARIN (PORCINE) IN NACL 2-0.9 UNIT/ML-% IJ SOLN
INTRAMUSCULAR | Status: AC
  Filled 2011-05-25: qty 1000

## 2011-05-25 MED ORDER — PHENOL 1.4 % MT LIQD: 1.0000 | OROMUCOSAL | Status: DC | PRN

## 2011-05-25 MED ORDER — LABETALOL HCL 5 MG/ML IV SOLN: 10.0000 mg | INTRAVENOUS | Status: DC | PRN

## 2011-05-25 MED ORDER — FENTANYL CITRATE 0.05 MG/ML IJ SOLN
INTRAMUSCULAR | Status: AC
  Filled 2011-05-25: qty 2

## 2011-05-25 MED ORDER — SODIUM CHLORIDE 0.9 % IV SOLN: 500.0000 mL | INTRAVENOUS | Status: DC

## 2011-05-25 MED ORDER — MIDAZOLAM HCL 2 MG/2ML IJ SOLN
INTRAMUSCULAR | Status: AC
  Filled 2011-05-25: qty 2

## 2011-05-25 MED ORDER — ONDANSETRON HCL 4 MG/2ML IJ SOLN: 4.0000 mg | Freq: Four times a day (QID) | INTRAMUSCULAR | Status: DC | PRN

## 2011-05-25 MED ORDER — OXYCODONE HCL 5 MG PO TABS: 5.0000 mg | ORAL_TABLET | ORAL | Status: DC | PRN

## 2011-05-25 MED ORDER — ACETAMINOPHEN 325 MG PO TABS: 325.0000 mg | ORAL_TABLET | ORAL | Status: DC | PRN

## 2011-05-25 MED ORDER — MORPHINE SULFATE 2 MG/ML IJ SOLN: 2.0000 mg | INTRAMUSCULAR | Status: DC | PRN

## 2011-05-25 MED ORDER — GUAIFENESIN-DM 100-10 MG/5ML PO SYRP: 15.0000 mL | ORAL_SOLUTION | ORAL | Status: DC | PRN

## 2011-05-25 NOTE — H&P (View-Only) (Signed)
Vascular and Vein Specialist of [place]   Patient name: [name] MRN: [id number] DOB: 1935-11-07 Sex: male   Referred by: Dr. Garnette Scheuermann   Reason for referral:  Chief Complaint  Patient presents with  . PVD    Eval for claudication symptoms  REF-> Dr. Verdis Prime,  pt c/o cramping in  his legs.    HISTORY OF PRESENT ILLNESS: I am seeing the patient today for evaluation of bilateral lower stomach claudication, left greater than right. This is a new problem I am treating of his. He has a history of undergoing mesenteric stenting for ischemic symptoms. He has also lost approximately 70 pounds. He underwent stenting of both the superior mesenteric as well as his celiac artery in July of 2011 this was done using a 6 mm Herculink balloon expandable stents. He does not endorse any further abdominal complaints. He is here today because of lower extremity cramping. He states that both legs give him problems especially when he is on his treadmill at about 10 minutes of walking. He describes his left calf it is beginning to her first is followed by the right. He begins to feel unsteady as if his laser to get out he is going to fall. This is severely lifestyle limiting for him. There are no aggravating factors other than exercise. Rest is beneficial for improvement of his symptoms. He denies nonhealing wounds or ulcerations.  The patient is maintained on Coumadin. At the time of his mesenteric issues he developed swelling in his left arm and was found to have a stenosis at his pacemaker insertion site he developed a subclavian vein thrombosis which was initially treated with angioplasty in getting the vein open he's been on Coumadin ever since he does not have any more issues with his arm. Patient office also suffers and diabetes hypertension and hypercholesterolemia all of which are medically managed. He does have mild renal insufficiency but has not required dialysis.  Past Medical History    Diagnosis Date  . Diabetes mellitus   . CAD (coronary artery disease)   . Hypertension   . Anemia   . Peripheral vascular disease   . Hyperlipidemia   . Cancer     prostate  . Ulcer   . GERD (gastroesophageal reflux disease)   . Liddle's syndrome   . Hemorrhoids   . Intestinal ischemia   . DVT (deep venous thrombosis)   . Atrial fibrillation     Past Surgical History  Procedure Date  . Coronary artery bypass graft 1994  . Hiatal hernia repair     and ulcer repair  . Appendectomy   . Cholecystectomy     History   Social History  . Marital Status: Married    Spouse Name: N/A    Number of Children: N/A  . Years of Education: N/A   Occupational History  . Not on file.   Social History Main Topics  . Smoking status: Former Smoker    Types: Cigarettes    Quit date: 07/12/1977  . Smokeless tobacco: Not on file  . Alcohol Use: No  . Drug Use: No  . Sexually Active:    Other Topics Concern  . Not on file   Social History Narrative  . No narrative on file    History reviewed. No pertinent family history.  Allergies as of 05/17/2011 - Review Complete 05/17/2011  Allergen Reaction Noted  . Ace inhibitors Cough 05/12/2011  . Ibuprofen  05/12/2011    Current Outpatient Prescriptions  on File Prior to Visit  Medication Sig Dispense Refill  . acetaminophen (ARTHRITIS PAIN RELIEF) 650 MG CR tablet Take 1,300 mg by mouth 2 (two) times daily.        Marland Kitchen amLODipine (NORVASC) 5 MG tablet Take 5 mg by mouth daily.        Marland Kitchen aspirin EC 81 MG tablet Take 81 mg by mouth daily.        Marland Kitchen atorvastatin (LIPITOR) 40 MG tablet Take 40 mg by mouth daily.        Marland Kitchen CALCIUM PO Take 2,000 mg by mouth daily.        . carbonyl iron (FEOSOL) 45 MG TABS Take 45 mg by mouth 2 (two) times daily with a meal.        . cholecalciferol (VITAMIN D) 1000 UNITS tablet Take 2,000 Units by mouth daily.       . cloNIDine (CATAPRES) 0.2 MG tablet Take 0.2 mg by mouth 2 (two) times daily.        Marland Kitchen  diltiazem (CARDIZEM CD) 180 MG 24 hr capsule Take 360 mg by mouth daily.        . furosemide (LASIX) 80 MG tablet Take 40 mg by mouth daily.       Marland Kitchen Leuprolide Acetate (LUPRON IJ) Inject as directed.        Marland Kitchen losartan (COZAAR) 100 MG tablet Take 100 mg by mouth daily.        . metFORMIN (GLUCOPHAGE) 500 MG tablet Take 1,000 mg by mouth 2 (two) times daily with a meal.        . nitroGLYCERIN (NITROSTAT) 0.4 MG SL tablet Place 0.4 mg under the tongue every 5 (five) minutes as needed.        Marland Kitchen omeprazole (PRILOSEC) 20 MG capsule Take 20 mg by mouth daily.        . Pancrelipase, Lip-Prot-Amyl, 5000 UNITS CPEP Take 2 capsules by mouth 3 (three) times daily.        . potassium chloride (KLOR-CON) 20 MEQ packet Take 20 mEq by mouth 3 (three) times daily.        Marland Kitchen warfarin (COUMADIN) 5 MG tablet Take 5 mg by mouth daily. Take 5 mg Tuesday, Thursday and Sunday. 7.5 mg Monday, Wednesday, Friday and Saturday          REVIEW OF SYSTEMS: Cardiovascular: Positive for chest pain, chest pressure, palpitations, and dyspnea on exertion, claudication.  Negative for  rest pain,  No history of DVT or phlebitis. Pulmonary: No productive cough, asthma or wheezing. Neurologic: Positive for dizziness and headaches Hematologic: Positive for anemia Musculoskeletal: Positive for arthritis joint pain or muscle pain Gastrointestinal: No blood in stool or hematemesis Genitourinary: No dysuria or hematuria. Positive for frequent urination Psychiatric:: No history of major depression. Integumentary: No rashes or ulcers. Constitutional: No fever or chills.  PHYSICAL EXAMINATION: General: The patient appears their stated age.  Vital signs are BP 147/69  Pulse 61  Resp 16  Ht 5\' 10"  (1.778 m)  Wt 166 lb (75.297 kg)  BMI 23.82 kg/m2  SpO2 98% Pulmonary: Respirations are non-labored Abdomen: Soft and non-tender with normal pitch bowel sounds. Musculoskeletal: There are no major deformities.   Neurologic: No focal  weakness or paresthesias are detected, Skin: There are no ulcer or rashes noted. Psychiatric: The patient has normal affect. Cardiovascular: There is a regular rate and rhythm without significant murmur appreciated. Pedal pulses are nonpalpable he does have palpable femoral  pulses Diagnostic Studies: Ultrasound was  ordered and reviewed today this shows an ABI of 0.97 on the right however waveforms are monophasic. On the left the ABIs port is 0.75 also with monophasic waveforms   Medication Changes:  the patient's Coumadin will be stopped 5 days prior to his angiogram. I am going to bridge him with Lovenox   Assessment:   bilateral lifestyle limiting claudication, mesenteric stenosis  Plan:  attending the next course of action is to proceed with angiography. At the time of his angiogram since he has not had any followup ultrasounds for his mesenteric stent I would like to perform a mesenteric angiogram to make sure that he does not have in-stent stenosis. Did tell them that if he does have in-stent stenosis of the treated that time. Simultaneously L. proceed with an abdominal aortogram with bilateral runoff and possible intervention if need necessary. Access to be in the right groin initially. This is been scheduled for Tuesday, November 13. I elected to bridge with Lovenox and stopped his Coumadin 5 days prior to the procedure.      Jorge Ny, M.D. [facility] [place] Office: [phone]

## 2011-05-25 NOTE — Brief Op Note (Signed)
05/25/2011  12:25 PM  PATIENT:  [name]  75 y.o. male  PRE-OPERATIVE DIAGNOSIS:  PVD  POST-OPERATIVE DIAGNOSIS:  same  PROCEDURE:  Procedure(s): VISCERAL ANGIOGRAM ABDOMINAL ANGIOGRAM LOWER EXTREMITY ANGIOGRAM    PHYSICIAN ASSISTANT:   none ANESTHESIA:   none  EBL:   0  BLOOD ADMINISTERED:none  DRAINS: none   LOCAL MEDICATIONS USED:  LIDOCAINE 6CC  SPECIMEN:  No Specimen  DISPOSITION OF SPECIMEN:  N/A  COUNTS:  YES   DICTATION: .Note written in EPIC  PLAN OF CARE: Discharge to home after PACU  PATIENT DISPOSITION:  PACU - hemodynamically stable.   Delay start of Pharmacological VTE agent (>24hrs) due to surgical blood loss or risk of bleeding:  {YES/NO/NOT APPLICABLE:20182

## 2011-05-25 NOTE — Interval H&P Note (Signed)
History and Physical Interval Note:   05/25/2011   10:59 AM   [name]  has presented today for surgery, with the diagnosis of PVD and mesenteric stenosis. The various methods of treatment have been discussed with the patient and family. After consideration of risks, benefits and other options for treatment, the patient has consented to  Procedure(s): VISCERAL ANGIOGRAM with possible intervention ABDOMINAL ANGIOGRAM LOWER EXTREMITY ANGIOGRAM as a surgical intervention .  The patients' history has been reviewed, patient examined, no change in status, stable for surgery.  I have reviewed the patients' chart and labs.  Questions were answered to the patient's satisfaction.     Jorge Ny  MD

## 2011-05-25 NOTE — Op Note (Signed)
[facility] [place]  Patient name: [name] MRN: [id number] DOB: [birth year] Sex: male  05/25/2011 Pre-operative Diagnosis: #1: Mesenteric ischemia #2: Bilateral claudication Post-operative diagnosis:  Same Surgeon:  Jorge Ny Procedure Performed:  1.  ultrasound guided access right common femoral artery  2.  abdominal aortogram  3.  celiac catheterization (first order), celiac angiogram  4.  mesenteric catheterization (first order), mesenteric angiogram  5.  bilateral lower extremity runoff   Indications:  The patient has a history of celiac and superior mesenteric artery stents for chronic mesenteric ischemia. He has not had followup for this and therefore imaging will be performed and intervention done if necessary. He is currently not having any symptoms. The patient also complains of bilateral lower instrument claudication left greater than right he is here today for further evaluation and possible intervention.  Procedure:  The patient was identified in the holding area and taken to room 8.  The patient was then placed supine on the table and prepped and draped in the usual sterile fashion.  A time out was called.  Ultrasound was used to evaluate the right common femoral artery.  It was patent .  A digital ultrasound image was acquired.  A micropuncture needle was used to access the right common femoral artery under ultrasound guidance.  An 018 wire was advanced without resistance and a micropuncture sheath was placed.  The 018 wire was removed and a benson wire was placed.  The micropuncture sheath was exchanged for a 5 french sheath.  An omniflush catheter was advanced over the wire to the level of L-1.  An abdominal angiogram was obtained in the AP and lateral projections.  The celiac artery was cannulated using a JR 4 catheter and a celiac angiogram was performed. The superior mesenteric artery was selected and cannulated and a mesenteric angiogram  was performed. Next, using the omniflush catheter placed at the aortic bifurcation allowed bilateral lower extremity runoff.  Findings:   Celiac angiogram:  There is a stent within the proximal celiac artery extending out into the abdominal aorta for the distance of 1-2 mm. The branches of the celiac artery appear patent. There is approximately 40% stenosis of the celiac artery at its origin.  Mesenteric angiogram: There is a stent at the origin of the mesenteric artery. The superior mesenteric artery is heavily calcified throughout its course however no stenosis was identified. There is no evidence of stenosis within the stent.  Aortogram:  The infrarenal abdominal aorta is patent throughout it's course. Single renal arteries are identified which are widely patent. Bilateral common iliac arteries are patent. Bilateral external iliac vessels are patent. There does appear to be a stenosis within the mid external iliac artery on the left. However this could not be well identified. I was unable to get a catheter over the aortic bifurcation because of the calcification  Right Lower Extremity:  The right common femoral artery is heavily calcified with approximately 50% stenosis which is diffuse. There are 2 main profunda branches which also have islands of calcium the superficial femoral artery is occluded there is reconstitution of the above-knee popliteal artery there is a filling defect in the popliteal artery behind the patella there is two-vessel runoff however this is somewhat obscured by proximal disease and patient artifact and movement.  Left Lower Extremity:  The left common femoral artery is patent and but heavily calcified there is a high-grade stenosis at the origin of the profundus femoral artery. The  profunda branches are patent. The superficial femoral artery is occluded there is reconstitution of the popliteal artery behind the patella the below knee popliteal artery is patent the peroneal and  artery CC the dominant runoff there is reconstitution of the posterior tibial artery.  Intervention:  At this point in time the decision was made to not proceed with intervention due to the patient's severe disease. Catheters and wires were removed and the patient was taken the holding area for sheath pull.  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

## 2011-05-26 LAB — POCT I-STAT, CHEM 8
BUN: 16 mg/dL (ref 6–23)
Hemoglobin: 10.9 g/dL — ABNORMAL LOW (ref 13.0–17.0)
Potassium: 3.3 mEq/L — ABNORMAL LOW (ref 3.5–5.1)
Sodium: 144 mEq/L (ref 135–145)
TCO2: 27 mmol/L (ref 0–100)

## 2011-06-14 ENCOUNTER — Ambulatory Visit: Payer: Medicare Other | Admitting: Surgery

## 2011-06-14 ENCOUNTER — Other Ambulatory Visit: Payer: Medicare Other

## 2011-06-14 ENCOUNTER — Other Ambulatory Visit (HOSPITAL_COMMUNITY): Payer: Self-pay | Admitting: *Deleted

## 2011-06-17 ENCOUNTER — Encounter (HOSPITAL_COMMUNITY)
Admission: RE | Admit: 2011-06-17 | Discharge: 2011-06-17 | Disposition: A | Discharge status: Home / Self Care | Payer: Medicare Other | Source: Ambulatory Visit | Attending: Gastroenterology | Admitting: Gastroenterology

## 2011-06-17 DIAGNOSIS — D500 Iron deficiency anemia secondary to blood loss (chronic): Secondary | ICD-10-CM | POA: Insufficient documentation

## 2011-06-17 MED ORDER — SODIUM CHLORIDE 0.9 % IV SOLN
500.0000 mg | INTRAVENOUS | Status: DC
  Administered 2011-06-17: 500 mg via INTRAVENOUS
  Filled 2011-06-17: qty 10

## 2011-06-18 ENCOUNTER — Encounter: Payer: Self-pay | Admitting: Surgery

## 2011-06-21 ENCOUNTER — Encounter: Payer: Self-pay | Admitting: Surgery

## 2011-06-21 ENCOUNTER — Ambulatory Visit (INDEPENDENT_AMBULATORY_CARE_PROVIDER_SITE_OTHER): Payer: Medicare Other | Admitting: Surgery

## 2011-06-21 ENCOUNTER — Ambulatory Visit (INDEPENDENT_AMBULATORY_CARE_PROVIDER_SITE_OTHER): Payer: Medicare Other | Admitting: *Deleted

## 2011-06-21 VITALS — BP 148/78 | HR 56 | Resp 14 | Ht 70.0 in | Wt 164.0 lb

## 2011-06-21 DIAGNOSIS — I771 Stricture of artery: Secondary | ICD-10-CM

## 2011-06-21 DIAGNOSIS — I739 Peripheral vascular disease, unspecified: Secondary | ICD-10-CM

## 2011-06-21 DIAGNOSIS — K551 Chronic vascular disorders of intestine: Secondary | ICD-10-CM

## 2011-06-21 DIAGNOSIS — Z48812 Encounter for surgical aftercare following surgery on the circulatory system: Secondary | ICD-10-CM

## 2011-06-21 DIAGNOSIS — Z01810 Encounter for preprocedural cardiovascular examination: Secondary | ICD-10-CM

## 2011-06-21 NOTE — Progress Notes (Signed)
[facility]    Patient name: [name] MRN: [id number] DOB: [birth year] Sex: male     Chief Complaint  Patient presents with  . Follow-up    Discuss aortogram and vasc. lab done today, Pt reports increased weakness in his legs x 1 week    HISTORY OF PRESENT ILLNESS: The patient is back today to discuss the findings on his arteriogram. He complains of left leg claudication at approximately 2 blocks. This is not lifestyle limiting for him although it is bothersome. His arteriogram revealed 40% celiac artery stenosis. There was no evidence of stenosis with the superior mesenteric artery stent. He has bilateral superficial femoral artery occlusion and a high-grade left profunda femoris artery stenosis  The patient denies nonhealing wounds. He does not have rest pain.   Past Medical History  Diagnosis Date  . Diabetes mellitus   . CAD (coronary artery disease)   . Hypertension   . Anemia   . Peripheral vascular disease   . Hyperlipidemia   . Cancer     prostate  . Ulcer   . GERD (gastroesophageal reflux disease)   . Liddle's syndrome   . Hemorrhoids   . Intestinal ischemia   . DVT (deep venous thrombosis)   . Atrial fibrillation     Past Surgical History  Procedure Date  . Coronary artery bypass graft 1994  . Hiatal hernia repair     and ulcer repair  . Appendectomy   . Cholecystectomy   . Pacemaker insertion     History   Social History  . Marital Status: Married    Spouse Name: N/A    Number of Children: N/A  . Years of Education: N/A   Occupational History  . Not on file.   Social History Main Topics  . Smoking status: Former Smoker    Types: Cigarettes    Quit date: 07/12/1977  . Smokeless tobacco: Not on file  . Alcohol Use: No  . Drug Use: No  . Sexually Active:    Other Topics Concern  . Not on file   Social History Narrative  . No narrative on file    History reviewed. No pertinent family  history.  Allergies as of 06/21/2011 - Review Complete 06/21/2011  Allergen Reaction Noted  . Ace inhibitors Cough 05/12/2011  . Ibuprofen  05/12/2011    Current Outpatient Prescriptions on File Prior to Visit  Medication Sig Dispense Refill  . acetaminophen (ARTHRITIS PAIN RELIEF) 650 MG CR tablet Take 1,300 mg by mouth 2 (two) times daily.       Marland Kitchen amiodarone (PACERONE) 200 MG tablet Take 200 mg by mouth daily.       Marland Kitchen amLODipine (NORVASC) 5 MG tablet Take 5 mg by mouth daily.       Marland Kitchen aspirin EC 81 MG tablet Take 81 mg by mouth daily.       Marland Kitchen atorvastatin (LIPITOR) 40 MG tablet Take 40 mg by mouth daily.       Marland Kitchen CALCIUM PO Take 2,000 mg by mouth daily.       . carbonyl iron (FEOSOL) 45 MG TABS Take 45 mg by mouth 2 (two) times daily with a meal.       . cholecalciferol (VITAMIN D) 1000 UNITS tablet Take 2,000 Units by mouth daily.       . cloNIDine (CATAPRES) 0.2 MG tablet Take 0.2 mg by mouth 2 (two) times daily.       Marland Kitchen diltiazem (CARDIZEM CD)  180 MG 24 hr capsule Take 360 mg by mouth daily.       . furosemide (LASIX) 80 MG tablet Take 40 mg by mouth daily.       Marland Kitchen Leuprolide Acetate (LUPRON IJ) Inject as directed every 4 (four) months.       Marland Kitchen losartan (COZAAR) 100 MG tablet Take 100 mg by mouth daily.       . metFORMIN (GLUCOPHAGE) 500 MG tablet Take 1,000 mg by mouth 2 (two) times daily with a meal.       . nitroGLYCERIN (NITROSTAT) 0.4 MG SL tablet Place 0.4 mg under the tongue every 5 (five) minutes as needed. For chest pain      . omeprazole (PRILOSEC) 20 MG capsule Take 20 mg by mouth daily.       . Pancrelipase, Lip-Prot-Amyl, 5000 UNITS CPEP Take 2 capsules by mouth 3 (three) times daily.       . potassium chloride (KLOR-CON) 20 MEQ packet Take 20 mEq by mouth 3 (three) times daily.       Marland Kitchen warfarin (COUMADIN) 5 MG tablet Take 5 mg by mouth daily. Take 5 mg Tuesday, Wednesday, Thursday, saturday and Sunday. 7.5 mg Monday,  Friday         REVIEW OF SYSTEMS: No changes   PHYSICAL EXAMINATION:   Vital signs are BP 148/78  Pulse 56  Resp 14  Ht 5\' 10"  (1.778 m)  Wt 164 lb (74.39 kg)  BMI 23.53 kg/m2  SpO2 98% General: The patient appears their stated age. HEENT:  No gross abnormalities Pulmonary:  Non labored breathing Musculoskeletal: There are no major deformities. Neurologic: No focal weakness or paresthesias are detected, Skin: There are no ulcer or rashes noted. Psychiatric: The patient has normal affect. Cardiovascular: There is a regular rate and rhythm without significant murmur appreciated.   Diagnostic Studies None  Assessment: 1: Mesenteric ischemia status post stenting  #2: Bilateral claudication left greater than right  Plan: I discussed the angiographic findings with the patient. I do not feel he is a good candidate for percutaneous revascularization of his leg. He would require surgical revascularization. At this time the patient does not feel his symptoms warrant proceeding with an operation. I discussed the importance of a regular exercise program. I also told him to contact me should he develop worsening pain or a nonhealing wound. He will come back to see me in 6 months with a repeat ultrasound to look at his mesenteric stent.  Jorge Ny, M.D. [facility] Office: [phone] Pager:  [phone]

## 2011-06-28 NOTE — Procedures (Unsigned)
VASCULAR LAB EXAM  INDICATION:  Preoperative lower extremity vein mapping.  HISTORY: Diabetes:  Yes. Cardiac:  CABG, 01/22/1993. Hypertension:  Yes.  EXAM:  Bilateral lower extremity vein mapping.  IMPRESSION: 1. Patent bilateral greater saphenous veins with diameter measurements     shown on the following worksheet. 2. The right and left small saphenous veins show areas of chronic deep     venous thrombosis.  ___________________________________________ V. Charlena Cross, MD  EM/MEDQ  D:  06/21/2011  T:  06/21/2011  Job:  [id number]

## 2011-07-15 ENCOUNTER — Encounter (HOSPITAL_COMMUNITY)
Admission: RE | Admit: 2011-07-15 | Discharge: 2011-07-15 | Disposition: A | Discharge status: Home / Self Care | Payer: Medicare Other | Source: Ambulatory Visit | Attending: Gastroenterology | Admitting: Gastroenterology

## 2011-07-15 DIAGNOSIS — D500 Iron deficiency anemia secondary to blood loss (chronic): Secondary | ICD-10-CM | POA: Insufficient documentation

## 2011-07-15 MED ORDER — SODIUM CHLORIDE 0.9 % IV SOLN
500.0000 mg | INTRAVENOUS | Status: DC
  Administered 2011-07-15: 500 mg via INTRAVENOUS
  Filled 2011-07-15: qty 10

## 2011-08-19 ENCOUNTER — Encounter (HOSPITAL_COMMUNITY)
Admission: RE | Admit: 2011-08-19 | Discharge: 2011-08-19 | Disposition: A | Discharge status: Home / Self Care | Payer: Medicare Other | Source: Ambulatory Visit | Attending: Gastroenterology | Admitting: Gastroenterology

## 2011-08-19 DIAGNOSIS — D500 Iron deficiency anemia secondary to blood loss (chronic): Secondary | ICD-10-CM | POA: Insufficient documentation

## 2011-08-19 MED ORDER — SODIUM CHLORIDE 0.9 % IV SOLN
500.0000 mg | INTRAVENOUS | Status: AC
  Administered 2011-08-19: 500 mg via INTRAVENOUS
  Filled 2011-08-19: qty 10

## 2011-09-09 ENCOUNTER — Encounter (HOSPITAL_COMMUNITY)
Admission: RE | Admit: 2011-09-09 | Discharge: 2011-09-09 | Disposition: A | Discharge status: Home / Self Care | Payer: Medicare Other | Source: Ambulatory Visit | Attending: Gastroenterology | Admitting: Gastroenterology

## 2011-09-09 MED ORDER — SODIUM CHLORIDE 0.9 % IV SOLN
500.0000 mg | INTRAVENOUS | Status: DC
  Administered 2011-09-09: 500 mg via INTRAVENOUS
  Filled 2011-09-09: qty 10

## 2011-09-09 MED ORDER — SODIUM CHLORIDE 0.9 % IV SOLN: 500.0000 mg | Freq: Once | INTRAVENOUS | Status: DC

## 2011-10-07 ENCOUNTER — Encounter (HOSPITAL_COMMUNITY)
Admission: RE | Admit: 2011-10-07 | Discharge: 2011-10-07 | Disposition: A | Discharge status: Home / Self Care | Payer: Medicare Other | Source: Ambulatory Visit | Attending: Gastroenterology | Admitting: Gastroenterology

## 2011-10-07 DIAGNOSIS — D500 Iron deficiency anemia secondary to blood loss (chronic): Secondary | ICD-10-CM | POA: Insufficient documentation

## 2011-10-07 MED ORDER — SODIUM CHLORIDE 0.9 % IV SOLN
500.0000 mg | INTRAVENOUS | Status: DC
  Administered 2011-10-07: 500 mg via INTRAVENOUS
  Filled 2011-10-07: qty 10

## 2011-11-04 ENCOUNTER — Encounter (HOSPITAL_COMMUNITY)
Admission: RE | Admit: 2011-11-04 | Discharge: 2011-11-04 | Disposition: A | Discharge status: Home / Self Care | Payer: Medicare Other | Source: Ambulatory Visit | Attending: Gastroenterology | Admitting: Gastroenterology

## 2011-11-04 DIAGNOSIS — D500 Iron deficiency anemia secondary to blood loss (chronic): Secondary | ICD-10-CM | POA: Insufficient documentation

## 2011-11-04 MED ORDER — SODIUM CHLORIDE 0.9 % IV SOLN
500.0000 mg | INTRAVENOUS | Status: AC
  Administered 2011-11-04: 500 mg via INTRAVENOUS
  Filled 2011-11-04: qty 10

## 2011-12-17 ENCOUNTER — Encounter: Payer: Self-pay | Admitting: Neurosurgery

## 2011-12-20 ENCOUNTER — Ambulatory Visit (INDEPENDENT_AMBULATORY_CARE_PROVIDER_SITE_OTHER): Payer: Medicare Other | Admitting: Neurosurgery

## 2011-12-20 ENCOUNTER — Encounter: Payer: Self-pay | Admitting: Neurosurgery

## 2011-12-20 ENCOUNTER — Other Ambulatory Visit: Payer: Self-pay | Admitting: *Deleted

## 2011-12-20 ENCOUNTER — Encounter (HOSPITAL_COMMUNITY): Payer: Self-pay | Admitting: Pharmacy Technician

## 2011-12-20 ENCOUNTER — Other Ambulatory Visit (INDEPENDENT_AMBULATORY_CARE_PROVIDER_SITE_OTHER): Payer: Medicare Other

## 2011-12-20 ENCOUNTER — Encounter: Payer: Self-pay | Admitting: *Deleted

## 2011-12-20 VITALS — BP 129/67 | HR 60 | Resp 14 | Ht 70.0 in | Wt 158.2 lb

## 2011-12-20 DIAGNOSIS — K551 Chronic vascular disorders of intestine: Secondary | ICD-10-CM

## 2011-12-20 NOTE — Progress Notes (Signed)
[facility] HISTORY AND PHYSICAL   CC: 76 year old male patient of Dr. [name] followed up for mesenteric artery stent and PVD Referring Physician: [name]  History of Present Illness: 76 year old male patient of Dr. [name] that is followed status post a celiac and superior mesenteric artery stent. This was placed to Dr. [name] July 2011. Patient has complaints of continued weight loss of unknown etiology, the patient does not describe food fear however his wife states that on a daily basis he is not eating very well. Patient also has complaints of left lower extremity edema as well as some discoloration in the digits of his left foot and continued complaints of claudication at rest and with ambulation.  Past Medical History  Diagnosis Date  . Diabetes mellitus   . CAD (coronary artery disease)   . Hypertension   . Anemia   . Peripheral vascular disease   . Hyperlipidemia   . Cancer     prostate  . Ulcer   . GERD (gastroesophageal reflux disease)   . Liddle's syndrome   . Hemorrhoids   . Intestinal ischemia   . DVT (deep venous thrombosis)   . Atrial fibrillation   . CHF (congestive heart failure)   . Myocardial infarction     ROS: [x]  Positive   [ ]  Denies    General: [ ]  Weight loss, [ ]  Fever, [ ]  chills Neurologic: [ ]  Dizziness, [ ]  Blackouts, [ ]  Seizure [ ]  Stroke, [ ]  "Mini stroke", [ ]  Slurred speech, [ ]  Temporary blindness; [x ] weakness in arms or legs, [ ]  Hoarseness Cardiac: [ x] Chest pain/pressure, [ ]  Shortness of breath at rest [ ]  Shortness of breath with exertion, [ ]  Atrial fibrillation or irregular heartbeat Vascular: [x ] Pain in legs with walking, [ ]  Pain in legs at rest, [ ]  Pain in legs at night,  [ ]  Non-healing ulcer, [x ] Blood clot in vein/DVT,   Pulmonary: [ ]  Home oxygen, [ ]  Productive cough, [ ]  Coughing up blood, [ ]  Asthma,  [ ]  Wheezing Musculoskeletal:  [ ]  Arthritis, [ ]  Low back pain, [ ]  Joint  pain Hematologic: [ ]  Easy Bruising, [ ]  Anemia; [ ]  Hepatitis Gastrointestinal: [ ]  Blood in stool, [ ]  Gastroesophageal Reflux/heartburn, [ ]  Trouble swallowing Urinary: [ ]  chronic Kidney disease, [ ]  on HD - [ ]  MWF or [ ]  TTHS, [ ]  Burning with urination, [ ]  Difficulty urinating Skin: [ ]  Rashes, [ ]  Wounds Psychological: [ ]  Anxiety, [ ]  Depression   Social History History  Substance Use Topics  . Smoking status: Former Smoker    Types: Cigarettes    Quit date: 07/12/1977  . Smokeless tobacco: Not on file  . Alcohol Use: No    Family History Family History  Problem Relation Age of Onset  . Diabetes Mother   . Heart disease Mother     Heart Disease before age 42  . Hyperlipidemia Mother   . Hypertension Mother   . Cancer Father   . Cancer Sister   . Diabetes Sister   . Heart disease Daughter     Heart Disease before age 72  . Hypertension Daughter   . Heart attack Daughter     Allergies  Allergen Reactions  . Ace Inhibitors Cough  . Ibuprofen     GI Issues    Current Outpatient Prescriptions  Medication Sig Dispense Refill  . acetaminophen (ARTHRITIS PAIN RELIEF) 650 MG  CR tablet Take 1,300 mg by mouth 2 (two) times daily.       Marland Kitchen amiodarone (PACERONE) 200 MG tablet Take 200 mg by mouth daily.       Marland Kitchen amLODipine (NORVASC) 5 MG tablet Take 5 mg by mouth daily.       Marland Kitchen aspirin EC 81 MG tablet Take 81 mg by mouth daily.       Marland Kitchen atorvastatin (LIPITOR) 40 MG tablet Take 80 mg by mouth daily.       Marland Kitchen CALCIUM PO Take 2,000 mg by mouth daily.       . carbonyl iron (FEOSOL) 45 MG TABS Take 45 mg by mouth 2 (two) times daily with a meal.       . cholecalciferol (VITAMIN D) 1000 UNITS tablet Take 2,000 Units by mouth daily.       . cloNIDine (CATAPRES) 0.2 MG tablet Take 0.2 mg by mouth 2 (two) times daily.       Marland Kitchen diltiazem (CARDIZEM CD) 180 MG 24 hr capsule Take 360 mg by mouth daily.       . furosemide (LASIX) 80 MG tablet Take 40 mg by mouth daily.       Marland Kitchen  Leuprolide Acetate (LUPRON IJ) Inject as directed every 4 (four) months.       Marland Kitchen losartan (COZAAR) 100 MG tablet Take 100 mg by mouth daily.       . metFORMIN (GLUCOPHAGE) 500 MG tablet Take 1,000 mg by mouth 2 (two) times daily with a meal.       . nitroGLYCERIN (NITROSTAT) 0.4 MG SL tablet Place 0.4 mg under the tongue every 5 (five) minutes as needed. For chest pain      . omeprazole (PRILOSEC) 20 MG capsule Take 20 mg by mouth daily.       . Pancrelipase, Lip-Prot-Amyl, 5000 UNITS CPEP Take 2 capsules by mouth 3 (three) times daily.       . potassium chloride (KLOR-CON) 20 MEQ packet Take 20 mEq by mouth 3 (three) times daily.       Marland Kitchen warfarin (COUMADIN) 5 MG tablet Take 5 mg by mouth daily. Take 5 mg Tuesday, Wednesday, Thursday, saturday and Sunday. 7.5 mg Monday,  Friday        Physical Examination  Filed Vitals:   12/20/11 1157  BP: 129/67  Pulse: 60  Resp: 14    Body mass index is 22.70 kg/(m^2).  General:  WDWN in NAD Gait: Normal HEENT: WNL Eyes: Pupils equal Pulmonary: normal non-labored breathing , without Rales, rhonchi,  wheezing Cardiac: RRR, without  Murmurs, rubs or gallops; No carotid bruits Abdomen: soft, NT, no masses Skin: no rashes, ulcers noted Vascular Exam/Pulses: Patient does not have palpable lower extremity pulses femoral pulses are dampened  Extremities without ischemic changes, no Gangrene , no cellulitis; no open wounds;  Musculoskeletal: no muscle wasting or atrophy  Neurologic: A&O X 3; Appropriate Affect ; SENSATION: normal; MOTOR FUNCTION:  moving all extremities equally. Speech is fluent/normal  Non-Invasive Vascular Imaging: Mesenteric artery duplex evaluation shows increased velocities in the mesenteric artery most notable in the mid artery of 421. Dr. [name] reviewed this study and spoke with the patient and his wife.  ASSESSMENT/PLAN: Patient with symptoms of claudication in the lower extremities as well as weight loss and abdominal pain  with eating. Dr. [name] spoke with the patient and his wife, he will be set up for angiogram with runoff and possible intervention for Tuesday, June 18, his followup-year-old  be pending that procedure. Their questions were encouraged and answered.  Lauree Chandler ANP  Clinic M.D.: [name]

## 2011-12-24 ENCOUNTER — Emergency Department (HOSPITAL_COMMUNITY): Payer: Medicare Other

## 2011-12-24 ENCOUNTER — Telehealth (HOSPITAL_COMMUNITY): Payer: Self-pay | Admitting: Internal Medicine

## 2011-12-24 ENCOUNTER — Inpatient Hospital Stay (HOSPITAL_COMMUNITY)
Admission: EM | Admit: 2011-12-24 | Discharge: 2012-01-09 | DRG: 234 | Disposition: A | Discharge status: Home / Self Care | Payer: Medicare Other | Source: Ambulatory Visit | Attending: Surgery | Admitting: Surgery

## 2011-12-24 ENCOUNTER — Encounter (HOSPITAL_COMMUNITY)
Admission: EM | Disposition: A | Discharge status: Home / Self Care | Payer: Self-pay | Source: Ambulatory Visit | Attending: Interventional Cardiology

## 2011-12-24 ENCOUNTER — Encounter (HOSPITAL_COMMUNITY): Payer: Self-pay | Admitting: Internal Medicine

## 2011-12-24 DIAGNOSIS — Z86718 Personal history of other venous thrombosis and embolism: Secondary | ICD-10-CM

## 2011-12-24 DIAGNOSIS — I208 Other forms of angina pectoris: Secondary | ICD-10-CM

## 2011-12-24 DIAGNOSIS — D500 Iron deficiency anemia secondary to blood loss (chronic): Secondary | ICD-10-CM | POA: Diagnosis present

## 2011-12-24 DIAGNOSIS — I4891 Unspecified atrial fibrillation: Secondary | ICD-10-CM | POA: Diagnosis present

## 2011-12-24 DIAGNOSIS — I480 Paroxysmal atrial fibrillation: Secondary | ICD-10-CM

## 2011-12-24 DIAGNOSIS — R9431 Abnormal electrocardiogram [ECG] [EKG]: Secondary | ICD-10-CM

## 2011-12-24 DIAGNOSIS — E119 Type 2 diabetes mellitus without complications: Secondary | ICD-10-CM | POA: Diagnosis present

## 2011-12-24 DIAGNOSIS — I2510 Atherosclerotic heart disease of native coronary artery without angina pectoris: Secondary | ICD-10-CM

## 2011-12-24 DIAGNOSIS — Z8546 Personal history of malignant neoplasm of prostate: Secondary | ICD-10-CM

## 2011-12-24 DIAGNOSIS — I200 Unstable angina: Secondary | ICD-10-CM | POA: Diagnosis present

## 2011-12-24 DIAGNOSIS — E785 Hyperlipidemia, unspecified: Secondary | ICD-10-CM | POA: Diagnosis present

## 2011-12-24 DIAGNOSIS — I739 Peripheral vascular disease, unspecified: Secondary | ICD-10-CM

## 2011-12-24 DIAGNOSIS — I25810 Atherosclerosis of coronary artery bypass graft(s) without angina pectoris: Principal | ICD-10-CM | POA: Diagnosis present

## 2011-12-24 DIAGNOSIS — I509 Heart failure, unspecified: Secondary | ICD-10-CM | POA: Diagnosis present

## 2011-12-24 DIAGNOSIS — N183 Chronic kidney disease, stage 3 unspecified: Secondary | ICD-10-CM | POA: Diagnosis present

## 2011-12-24 DIAGNOSIS — K219 Gastro-esophageal reflux disease without esophagitis: Secondary | ICD-10-CM | POA: Diagnosis present

## 2011-12-24 DIAGNOSIS — E876 Hypokalemia: Secondary | ICD-10-CM | POA: Diagnosis not present

## 2011-12-24 DIAGNOSIS — Z87891 Personal history of nicotine dependence: Secondary | ICD-10-CM

## 2011-12-24 DIAGNOSIS — I129 Hypertensive chronic kidney disease with stage 1 through stage 4 chronic kidney disease, or unspecified chronic kidney disease: Secondary | ICD-10-CM | POA: Diagnosis present

## 2011-12-24 DIAGNOSIS — D62 Acute posthemorrhagic anemia: Secondary | ICD-10-CM | POA: Diagnosis present

## 2011-12-24 DIAGNOSIS — Z951 Presence of aortocoronary bypass graft: Secondary | ICD-10-CM

## 2011-12-24 DIAGNOSIS — Z950 Presence of cardiac pacemaker: Secondary | ICD-10-CM

## 2011-12-24 HISTORY — PX: LEFT HEART CATHETERIZATION WITH CORONARY/GRAFT ANGIOGRAM: SHX5450

## 2011-12-24 LAB — GLUCOSE, CAPILLARY
Glucose-Capillary: 128 mg/dL — ABNORMAL HIGH (ref 70–99)
Glucose-Capillary: 190 mg/dL — ABNORMAL HIGH (ref 70–99)
Glucose-Capillary: 217 mg/dL — ABNORMAL HIGH (ref 70–99)

## 2011-12-24 LAB — POCT I-STAT, CHEM 8
BUN: 17 mg/dL (ref 6–23)
Calcium, Ion: 1.15 mmol/L (ref 1.12–1.32)
Chloride: 102 mEq/L (ref 96–112)
Creatinine, Ser: 1.3 mg/dL (ref 0.50–1.35)
Glucose, Bld: 143 mg/dL — ABNORMAL HIGH (ref 70–99)

## 2011-12-24 LAB — DIFFERENTIAL
Eosinophils Absolute: 0 10*3/uL (ref 0.0–0.7)
Eosinophils Relative: 0 % (ref 0–5)
Lymphs Abs: 1.3 10*3/uL (ref 0.7–4.0)
Monocytes Relative: 9 % (ref 3–12)

## 2011-12-24 LAB — CARDIAC PANEL(CRET KIN+CKTOT+MB+TROPI)
CK, MB: 2.7 ng/mL (ref 0.3–4.0)
Relative Index: 1.4 (ref 0.0–2.5)
Relative Index: 2 (ref 0.0–2.5)
Total CK: 146 U/L (ref 7–232)
Total CK: 138 U/L (ref 7–232)
Troponin I: <0.3 ng/mL (ref <0.30)

## 2011-12-24 LAB — CBC
Hemoglobin: 10 g/dL — ABNORMAL LOW (ref 13.0–17.0)
MCH: 34.5 pg — ABNORMAL HIGH (ref 26.0–34.0)
MCHC: 33.9 g/dL (ref 30.0–36.0)
MCV: 101.7 fL — ABNORMAL HIGH (ref 78.0–100.0)
RBC: 2.9 MIL/uL — ABNORMAL LOW (ref 4.22–5.81)

## 2011-12-24 SURGERY — LEFT HEART CATHETERIZATION WITH CORONARY/GRAFT ANGIOGRAM

## 2011-12-24 MED ORDER — NITROGLYCERIN IN D5W 200-5 MCG/ML-% IV SOLN
10.0000 ug/min | INTRAVENOUS | Status: DC
  Administered 2011-12-24: 15 ug/min via INTRAVENOUS
  Administered 2011-12-26: 3 ug/min via INTRAVENOUS
  Administered 2011-12-29: 20 ug/min via INTRAVENOUS
  Administered 2011-12-31: 10 ug/min via INTRAVENOUS
  Filled 2011-12-24 (×3): qty 250

## 2011-12-24 MED ORDER — METOPROLOL TARTRATE 1 MG/ML IV SOLN
2.5000 mg | Freq: Once | INTRAVENOUS | Status: AC
  Administered 2011-12-24: 2.5 mg via INTRAVENOUS
  Filled 2011-12-24: qty 5

## 2011-12-24 MED ORDER — AMIODARONE HCL 100 MG PO TABS
100.0000 mg | ORAL_TABLET | Freq: Every day | ORAL | Status: DC
  Administered 2011-12-24 – 2012-01-02 (×10): 100 mg via ORAL
  Filled 2011-12-24 (×12): qty 1

## 2011-12-24 MED ORDER — MIDAZOLAM HCL 2 MG/2ML IJ SOLN
INTRAMUSCULAR | Status: AC
  Filled 2011-12-24: qty 2

## 2011-12-24 MED ORDER — POTASSIUM CHLORIDE 20 MEQ PO PACK: 20.0000 meq | PACK | Freq: Three times a day (TID) | ORAL | Status: DC

## 2011-12-24 MED ORDER — METOPROLOL TARTRATE 25 MG PO TABS
25.0000 mg | ORAL_TABLET | Freq: Two times a day (BID) | ORAL | Status: DC
  Administered 2011-12-24 – 2012-01-02 (×19): 25 mg via ORAL
  Filled 2011-12-24 (×23): qty 1

## 2011-12-24 MED ORDER — HEPARIN (PORCINE) IN NACL 100-0.45 UNIT/ML-% IJ SOLN
900.0000 [IU]/h | INTRAMUSCULAR | Status: DC
  Administered 2011-12-24: 900 [IU]/h via INTRAVENOUS
  Filled 2011-12-24: qty 250

## 2011-12-24 MED ORDER — ONDANSETRON HCL 4 MG/2ML IJ SOLN
INTRAMUSCULAR | Status: AC
  Filled 2011-12-24: qty 2

## 2011-12-24 MED ORDER — ASPIRIN 81 MG PO CHEW
CHEWABLE_TABLET | ORAL | Status: AC
  Filled 2011-12-24: qty 3

## 2011-12-24 MED ORDER — SODIUM CHLORIDE 0.9 % IJ SOLN: 3.0000 mL | INTRAMUSCULAR | Status: DC | PRN

## 2011-12-24 MED ORDER — ASPIRIN 300 MG RE SUPP: 300.0000 mg | RECTAL | Status: DC

## 2011-12-24 MED ORDER — LOSARTAN POTASSIUM 50 MG PO TABS
100.0000 mg | ORAL_TABLET | Freq: Every day | ORAL | Status: DC
Start: 2011-12-24 — End: 2011-12-24
  Administered 2011-12-24: 100 mg via ORAL
  Filled 2011-12-24: qty 2

## 2011-12-24 MED ORDER — ASPIRIN 81 MG PO CHEW: 324.0000 mg | CHEWABLE_TABLET | ORAL | Status: DC

## 2011-12-24 MED ORDER — LIDOCAINE HCL (PF) 1 % IJ SOLN
INTRAMUSCULAR | Status: AC
  Filled 2011-12-24: qty 60

## 2011-12-24 MED ORDER — OXYCODONE-ACETAMINOPHEN 5-325 MG PO TABS: 1.0000 | ORAL_TABLET | ORAL | Status: DC | PRN

## 2011-12-24 MED ORDER — INSULIN ASPART 100 UNIT/ML ~~LOC~~ SOLN
0.0000 [IU] | Freq: Three times a day (TID) | SUBCUTANEOUS | Status: DC
  Administered 2011-12-24: 2 [IU] via SUBCUTANEOUS
  Administered 2011-12-25: 1 [IU] via SUBCUTANEOUS
  Administered 2011-12-25 – 2011-12-27 (×2): 2 [IU] via SUBCUTANEOUS
  Administered 2011-12-29 – 2011-12-30 (×2): 1 [IU] via SUBCUTANEOUS
  Administered 2011-12-31: 2 [IU] via SUBCUTANEOUS

## 2011-12-24 MED ORDER — METOPROLOL TARTRATE 1 MG/ML IV SOLN
5.0000 mg | Freq: Once | INTRAVENOUS | Status: AC
  Administered 2011-12-24: 5 mg via INTRAVENOUS

## 2011-12-24 MED ORDER — FERROUS SULFATE 325 (65 FE) MG PO TABS
325.0000 mg | ORAL_TABLET | Freq: Two times a day (BID) | ORAL | Status: DC
  Filled 2011-12-24 (×3): qty 1

## 2011-12-24 MED ORDER — SODIUM CHLORIDE 0.9 % IV SOLN: INTRAVENOUS | Status: DC

## 2011-12-24 MED ORDER — PANCRELIPASE (LIP-PROT-AMYL) 12000-38000 UNITS PO CPEP
1.0000 | ORAL_CAPSULE | Freq: Three times a day (TID) | ORAL | Status: DC
  Administered 2011-12-24 – 2012-01-09 (×41): 1 via ORAL
  Filled 2011-12-24 (×53): qty 1

## 2011-12-24 MED ORDER — CLONIDINE HCL 0.2 MG PO TABS
0.2000 mg | ORAL_TABLET | Freq: Two times a day (BID) | ORAL | Status: DC
  Administered 2011-12-24 – 2012-01-02 (×18): 0.2 mg via ORAL
  Filled 2011-12-24 (×22): qty 1

## 2011-12-24 MED ORDER — NITROGLYCERIN 0.2 MG/ML ON CALL CATH LAB
INTRAVENOUS | Status: AC
  Filled 2011-12-24: qty 1

## 2011-12-24 MED ORDER — PANTOPRAZOLE SODIUM 40 MG PO TBEC
40.0000 mg | DELAYED_RELEASE_TABLET | Freq: Every day | ORAL | Status: DC
  Administered 2011-12-24 – 2012-01-02 (×8): 40 mg via ORAL
  Filled 2011-12-24 (×8): qty 1

## 2011-12-24 MED ORDER — AMLODIPINE BESYLATE 5 MG PO TABS
5.0000 mg | ORAL_TABLET | Freq: Every day | ORAL | Status: DC
  Administered 2011-12-24 – 2012-01-02 (×10): 5 mg via ORAL
  Filled 2011-12-24 (×11): qty 1

## 2011-12-24 MED ORDER — NITROGLYCERIN 0.4 MG SL SUBL
0.4000 mg | SUBLINGUAL_TABLET | SUBLINGUAL | Status: DC | PRN
  Administered 2011-12-29: 0.4 mg via SUBLINGUAL
  Filled 2011-12-24: qty 25

## 2011-12-24 MED ORDER — TAMSULOSIN HCL 0.4 MG PO CAPS
0.4000 mg | ORAL_CAPSULE | Freq: Every day | ORAL | Status: DC
  Administered 2011-12-24 – 2012-01-02 (×10): 0.4 mg via ORAL
  Filled 2011-12-24 (×11): qty 1

## 2011-12-24 MED ORDER — HEPARIN (PORCINE) IN NACL 100-0.45 UNIT/ML-% IJ SOLN
1100.0000 [IU]/h | INTRAMUSCULAR | Status: DC
  Administered 2011-12-24 – 2011-12-27 (×3): 900 [IU]/h via INTRAVENOUS
  Administered 2011-12-29 – 2011-12-31 (×3): 1000 [IU]/h via INTRAVENOUS
  Administered 2012-01-01: 1100 [IU]/h via INTRAVENOUS
  Filled 2011-12-24 (×13): qty 250

## 2011-12-24 MED ORDER — HEPARIN BOLUS VIA INFUSION
2000.0000 [IU] | Freq: Once | INTRAVENOUS | Status: AC
  Administered 2011-12-24: 2000 [IU] via INTRAVENOUS
  Filled 2011-12-24: qty 2000

## 2011-12-24 MED ORDER — NITROGLYCERIN 0.4 MG SL SUBL: 0.4000 mg | SUBLINGUAL_TABLET | SUBLINGUAL | Status: DC | PRN

## 2011-12-24 MED ORDER — POTASSIUM CHLORIDE CRYS ER 20 MEQ PO TBCR
20.0000 meq | EXTENDED_RELEASE_TABLET | Freq: Three times a day (TID) | ORAL | Status: DC
  Administered 2011-12-24 – 2012-01-02 (×28): 20 meq via ORAL
  Filled 2011-12-24: qty 1
  Filled 2011-12-24: qty 2
  Filled 2011-12-24 (×30): qty 1

## 2011-12-24 MED ORDER — DILTIAZEM HCL ER COATED BEADS 360 MG PO CP24
360.0000 mg | ORAL_CAPSULE | Freq: Every day | ORAL | Status: DC
  Administered 2011-12-24 – 2011-12-28 (×5): 360 mg via ORAL
  Filled 2011-12-24 (×6): qty 1

## 2011-12-24 MED ORDER — HEPARIN (PORCINE) IN NACL 2-0.9 UNIT/ML-% IJ SOLN
INTRAMUSCULAR | Status: AC
  Filled 2011-12-24: qty 2000

## 2011-12-24 MED ORDER — POTASSIUM CHLORIDE CRYS ER 20 MEQ PO TBCR
EXTENDED_RELEASE_TABLET | ORAL | Status: AC
  Filled 2011-12-24: qty 2

## 2011-12-24 MED ORDER — ACETAMINOPHEN 325 MG PO TABS
975.0000 mg | ORAL_TABLET | Freq: Two times a day (BID) | ORAL | Status: DC
  Administered 2011-12-24: 975 mg via ORAL
  Filled 2011-12-24: qty 3

## 2011-12-24 MED ORDER — NITROGLYCERIN IN D5W 200-5 MCG/ML-% IV SOLN: 2.0000 ug/min | Freq: Once | INTRAVENOUS | Status: AC

## 2011-12-24 MED ORDER — POTASSIUM CHLORIDE CRYS ER 20 MEQ PO TBCR
40.0000 meq | EXTENDED_RELEASE_TABLET | Freq: Once | ORAL | Status: AC
  Administered 2011-12-24: 40 meq via ORAL
  Filled 2011-12-24: qty 2

## 2011-12-24 MED ORDER — HEPARIN SODIUM (PORCINE) 1000 UNIT/ML IJ SOLN
INTRAMUSCULAR | Status: AC
  Filled 2011-12-24: qty 1

## 2011-12-24 MED ORDER — VITAMIN D3 25 MCG (1000 UNIT) PO TABS
1000.0000 [IU] | ORAL_TABLET | Freq: Every day | ORAL | Status: DC
  Administered 2011-12-24 – 2012-01-09 (×15): 1000 [IU] via ORAL
  Filled 2011-12-24 (×17): qty 1

## 2011-12-24 MED ORDER — ATORVASTATIN CALCIUM 80 MG PO TABS
80.0000 mg | ORAL_TABLET | Freq: Every day | ORAL | Status: DC
  Administered 2011-12-24 – 2012-01-09 (×16): 80 mg via ORAL
  Filled 2011-12-24 (×17): qty 1

## 2011-12-24 MED ORDER — SODIUM CHLORIDE 0.9 % IJ SOLN: 3.0000 mL | Freq: Two times a day (BID) | INTRAMUSCULAR | Status: DC

## 2011-12-24 MED ORDER — FUROSEMIDE 40 MG PO TABS
40.0000 mg | ORAL_TABLET | Freq: Every day | ORAL | Status: DC
  Administered 2011-12-25 – 2012-01-02 (×8): 40 mg via ORAL
  Filled 2011-12-24 (×10): qty 1

## 2011-12-24 MED ORDER — POTASSIUM CHLORIDE CRYS ER 20 MEQ PO TBCR
40.0000 meq | EXTENDED_RELEASE_TABLET | Freq: Once | ORAL | Status: AC
  Administered 2011-12-24: 40 meq via ORAL

## 2011-12-24 MED ORDER — ASPIRIN EC 81 MG PO TBEC: 81.0000 mg | DELAYED_RELEASE_TABLET | Freq: Every day | ORAL | Status: DC

## 2011-12-24 MED ORDER — SODIUM CHLORIDE 0.9 % IV SOLN
INTRAVENOUS | Status: AC
  Administered 2011-12-24 (×2): via INTRAVENOUS

## 2011-12-24 MED ORDER — ONDANSETRON HCL 4 MG/2ML IJ SOLN: 4.0000 mg | Freq: Four times a day (QID) | INTRAMUSCULAR | Status: DC | PRN

## 2011-12-24 MED ORDER — SODIUM CHLORIDE 0.9 % IV SOLN: 250.0000 mL | INTRAVENOUS | Status: DC | PRN

## 2011-12-24 MED ORDER — ASPIRIN EC 81 MG PO TBEC
81.0000 mg | DELAYED_RELEASE_TABLET | Freq: Every day | ORAL | Status: DC
  Administered 2011-12-24 – 2012-01-02 (×10): 81 mg via ORAL
  Filled 2011-12-24 (×11): qty 1

## 2011-12-24 MED ORDER — POTASSIUM CHLORIDE CRYS ER 20 MEQ PO TBCR: 40.0000 meq | EXTENDED_RELEASE_TABLET | Freq: Once | ORAL | Status: DC

## 2011-12-24 MED ORDER — SODIUM CHLORIDE 0.9 % IV SOLN: 1.0000 mL/kg/h | INTRAVENOUS | Status: DC

## 2011-12-24 MED ORDER — VERAPAMIL HCL 2.5 MG/ML IV SOLN
INTRAVENOUS | Status: AC
  Filled 2011-12-24: qty 2

## 2011-12-24 MED ORDER — NITROGLYCERIN IN D5W 200-5 MCG/ML-% IV SOLN
2.0000 ug/min | Freq: Once | INTRAVENOUS | Status: AC
  Administered 2011-12-24: 5 ug/min via INTRAVENOUS
  Filled 2011-12-24: qty 250

## 2011-12-24 MED ORDER — ACETAMINOPHEN 325 MG PO TABS
650.0000 mg | ORAL_TABLET | ORAL | Status: DC | PRN
  Filled 2011-12-24: qty 2

## 2011-12-24 MED ORDER — FENTANYL CITRATE 0.05 MG/ML IJ SOLN
INTRAMUSCULAR | Status: AC
  Filled 2011-12-24: qty 2

## 2011-12-24 MED ORDER — METOPROLOL TARTRATE 1 MG/ML IV SOLN
INTRAVENOUS | Status: AC
  Filled 2011-12-24: qty 5

## 2011-12-24 NOTE — Consult Note (Signed)
CARDIOTHORACIC SURGERY CONSULTATION REPORT  PCP is Mickie Hillier, MD Referring Provider is Veatrice Kells, MD  Reason for consultation:  Severe 3-vessel CAD and vein graft disease s/p CABG  HPI:  Patient is a 76 year old African American male from [place] with known history of coronary artery disease hypertension hyperlipidemia type 2 diabetes mellitus peripheral vascular disease who underwent coronary artery bypass grafting x1 by Dr. Laneta Simmers in 1994. The patient apparently had single-vessel saphenous vein graft placed to the posterior descending coronary artery was opened saphenous vein harvest from the right lower leg. Specific details of this procedure not currently available. The patient also has history of paroxysmal atrial fibrillation and underwent placement of permanent pacemaker in 1996 with generator change more recently. Details of this again remained unclear at this time. However, the patient has done well from a cardiovascular standpoint until recently. He describes a several month history of classical symptoms of angina pectoris which initially developed when he was mowing the lawn. He describes substernal chest pain radiating across his chest brought on with physical activity and relieved by rest. The patient reports progressive exertional shortness of breath ear he denies resting shortness of breath, PND, orthopnea, or lower extremity edema. He's not had tachypalpitations nor syncope.  Coincident with this the patient also has severe symptoms of lower extremity claudication involving both lower legs, left greater than right. The patient states that actually the leg pain slows him down more than the chest pain, and he has even had one or 2 episodes of pain in the left calf and foot occuring at rest while he's in bed at night.  Finally, the patient has history of mesenteric ischemia for which she underwent stenting of the mesenteric vessels in the past. Recently the patient  has not had a good appetite and he thinks he may lost a little weight. He has vague intermittent abdominal pain that seems to the postprandial, but he notes that these symptoms are all still considerably better than they were prior to stenting of the mesenteric vessels several years ago.  Past Medical History  Diagnosis Date  . Diabetes mellitus   . CAD (coronary artery disease)     s/b CABG '94  . Hypertension   . Anemia     Macrocytic  . Peripheral vascular disease   . Hyperlipidemia   . Cancer     prostate  . Ulcer   . GERD (gastroesophageal reflux disease)   . Liddle's syndrome   . Hemorrhoids   . Intestinal ischemia   . DVT (deep venous thrombosis)   . Atrial fibrillation     Paroxysmal. S/p PPM  . CHF (congestive heart failure)     Past Surgical History  Procedure Date  . Hiatal hernia repair     and ulcer repair  . Appendectomy   . Pacemaker insertion   . Coronary artery bypass graft 01/22/1993  . Cholecystectomy Oct 2009    Gall Bladder    Family History  Problem Relation Age of Onset  . Diabetes Mother   . Heart disease Mother     Heart Disease before age 44  . Hyperlipidemia Mother   . Hypertension Mother   . Cancer Father   . Cancer Sister   . Diabetes Sister   . Heart disease Daughter     Heart Disease before age 11  . Hypertension Daughter   . Heart attack Daughter     Social History History  Substance  Use Topics  . Smoking status: Former Smoker    Types: Cigarettes    Quit date: 07/12/1977  . Smokeless tobacco: Not on file  . Alcohol Use: No    Prior to Admission medications   Medication Sig Start Date End Date Taking? Authorizing Provider  acetaminophen (ARTHRITIS PAIN RELIEF) 650 MG CR tablet Take 1,300 mg by mouth 2 (two) times daily.    Yes Historical Provider, MD  amiodarone (PACERONE) 200 MG tablet Take 100 mg by mouth daily.    Yes Historical Provider, MD  amLODipine (NORVASC) 5 MG tablet Take 5 mg by mouth daily.    Yes Historical  Provider, MD  aspirin EC 81 MG tablet Take 81 mg by mouth daily.    Yes Historical Provider, MD  atorvastatin (LIPITOR) 80 MG tablet Take 80 mg by mouth daily.   Yes Historical Provider, MD  CALCIUM PO Take 2,000 mg by mouth daily.    Yes Historical Provider, MD  cholecalciferol (VITAMIN D) 1000 UNITS tablet Take 1,000 Units by mouth daily.    Yes Historical Provider, MD  cloNIDine (CATAPRES) 0.2 MG tablet Take 0.2 mg by mouth 2 (two) times daily.    Yes Historical Provider, MD  diltiazem (CARDIZEM CD) 180 MG 24 hr capsule Take 360 mg by mouth daily.    Yes Historical Provider, MD  ferrous sulfate 325 (65 FE) MG tablet Take 325 mg by mouth 2 (two) times daily.   Yes Historical Provider, MD  furosemide (LASIX) 80 MG tablet Take 40 mg by mouth daily.    Yes Historical Provider, MD  Leuprolide Acetate (LUPRON DEPOT IM) Inject into the muscle See admin instructions. Every 4 months as needed   Yes Historical Provider, MD  losartan (COZAAR) 100 MG tablet Take 100 mg by mouth daily.    Yes Historical Provider, MD  metFORMIN (GLUCOPHAGE) 500 MG tablet Take 1,000 mg by mouth 2 (two) times daily with a meal.    Yes Historical Provider, MD  nitroGLYCERIN (NITROSTAT) 0.4 MG SL tablet Place 0.4 mg under the tongue every 5 (five) minutes as needed. For chest pain   Yes Historical Provider, MD  omeprazole (PRILOSEC) 20 MG capsule Take 20 mg by mouth daily.    Yes Historical Provider, MD  Pancrelipase, Lip-Prot-Amyl, 5000 UNITS CPEP Take 2 capsules by mouth 3 (three) times daily.    Yes Historical Provider, MD  potassium chloride (KLOR-CON) 20 MEQ packet Take 20 mEq by mouth 3 (three) times daily.    Yes Historical Provider, MD  silodosin (RAPAFLO) 8 MG CAPS capsule Take 8 mg by mouth daily with breakfast.   Yes Historical Provider, MD  warfarin (COUMADIN) 5 MG tablet Take 2.5-5 mg by mouth daily. Take 2.5mg  on Saturday, take 5mg  all other days   Yes Historical Provider, MD    Current Facility-Administered  Medications  Medication Dose Route Frequency Provider Last Rate Last Dose  . 0.9 %  sodium chloride infusion   Intravenous Continuous Lyn Records III, MD 125 mL/hr at 12/24/11 1400    . acetaminophen (TYLENOL) tablet 650 mg  650 mg Oral Q4H PRN Dolores Patty, MD      . amiodarone (PACERONE) tablet 100 mg  100 mg Oral Daily Dolores Patty, MD   100 mg at 12/24/11 1049  . amLODipine (NORVASC) tablet 5 mg  5 mg Oral Daily Dolores Patty, MD   5 mg at 12/24/11 1049  . aspirin 81 MG chewable tablet           .  aspirin EC tablet 81 mg  81 mg Oral Daily Dolores Patty, MD   81 mg at 12/24/11 1048  . atorvastatin (LIPITOR) tablet 80 mg  80 mg Oral Daily Dolores Patty, MD      . cholecalciferol (VITAMIN D) tablet 1,000 Units  1,000 Units Oral Daily Bevelyn Buckles Bensimhon, MD      . cloNIDine (CATAPRES) tablet 0.2 mg  0.2 mg Oral BID Dolores Patty, MD      . diltiazem (CARDIZEM CD) 24 hr capsule 360 mg  360 mg Oral Daily Dolores Patty, MD   360 mg at 12/24/11 1050  . fentaNYL (SUBLIMAZE) 0.05 MG/ML injection           . furosemide (LASIX) tablet 40 mg  40 mg Oral Daily Bevelyn Buckles Bensimhon, MD      . heparin 1000 UNIT/ML injection           . heparin 2-0.9 UNIT/ML-% infusion           . heparin ADULT infusion 100 units/mL (25000 units/250 mL)  900 Units/hr Intravenous Continuous Lyn Records III, MD      . heparin bolus via infusion 2,000 Units  2,000 Units Intravenous Once Dolores Patty, MD   2,000 Units at 12/24/11 0600  . insulin aspart (novoLOG) injection 0-9 Units  0-9 Units Subcutaneous TID WC Dolores Patty, MD   2 Units at 12/24/11 1731  . lidocaine (XYLOCAINE) 1 % injection           . lipase/protease/amylase (CREON-10/PANCREASE) capsule 1 capsule  1 capsule Oral TID AC Dolores Patty, MD   1 capsule at 12/24/11 1728  . metoprolol (LOPRESSOR) 1 MG/ML injection           . metoprolol (LOPRESSOR) injection 2.5 mg  2.5 mg Intravenous Once April K  Palumbo-Rasch, MD   2.5 mg at 12/24/11 0314  . metoprolol (LOPRESSOR) injection 5 mg  5 mg Intravenous Once Dolores Patty, MD   5 mg at 12/24/11 1610  . metoprolol tartrate (LOPRESSOR) tablet 25 mg  25 mg Oral BID Dolores Patty, MD   25 mg at 12/24/11 1048  . midazolam (VERSED) 2 MG/2ML injection           . nitroGLYCERIN (NITROSTAT) SL tablet 0.4 mg  0.4 mg Sublingual Q5 min PRN Dolores Patty, MD      . nitroGLYCERIN (NTG ON-CALL) 0.2 mg/mL injection           . nitroGLYCERIN 0.2 mg/mL in dextrose 5 % infusion  2-200 mcg/min Intravenous Once April K Palumbo-Rasch, MD 4.5 mL/hr at 12/24/11 0425 15 mcg/min at 12/24/11 0425  . nitroGLYCERIN 0.2 mg/mL in dextrose 5 % infusion  2-200 mcg/min Intravenous Once April K Palumbo-Rasch, MD      . nitroGLYCERIN 0.2 mg/mL in dextrose 5 % infusion  3-30 mcg/min Intravenous Titrated Dolores Patty, MD      . ondansetron [facility]) 4 MG/2ML injection           . oxyCODONE-acetaminophen (PERCOCET) 5-325 MG per tablet 1-2 tablet  1-2 tablet Oral Q4H PRN Lyn Records III, MD      . pantoprazole (PROTONIX) EC tablet 40 mg  40 mg Oral Q1200 Dolores Patty, MD      . potassium chloride SA (K-DUR,KLOR-CON) 20 MEQ CR tablet           . potassium chloride SA (K-DUR,KLOR-CON) CR tablet 20 mEq  20 mEq Oral  TID Lyn Records III, MD   20 mEq at 12/24/11 1730  . potassium chloride SA (K-DUR,KLOR-CON) CR tablet 40 mEq  40 mEq Oral Once April K Palumbo-Rasch, MD   40 mEq at 12/24/11 0318  . potassium chloride SA (K-DUR,KLOR-CON) CR tablet 40 mEq  40 mEq Oral Once Lyn Records III, MD   40 mEq at 12/24/11 1048  . Tamsulosin HCl (FLOMAX) capsule 0.4 mg  0.4 mg Oral Daily Dolores Patty, MD   0.4 mg at 12/24/11 1050  . verapamil (ISOPTIN) 2.5 MG/ML injection           . DISCONTD: 0.9 %  sodium chloride infusion   Intravenous Continuous Nada Libman, MD      . DISCONTD: 0.9 %  sodium chloride infusion  250 mL Intravenous PRN Lyn Records III, MD        . DISCONTD: 0.9 %  sodium chloride infusion  1 mL/kg/hr Intravenous Continuous Lyn Records III, MD      . DISCONTD: 0.9 %  sodium chloride infusion   Intravenous Continuous Lyn Records III, MD 150 mL/hr at 12/24/11 1234 150 mL/hr at 12/24/11 1234  . DISCONTD: acetaminophen (TYLENOL) tablet 975 mg  975 mg Oral BID Dolores Patty, MD   975 mg at 12/24/11 1047  . DISCONTD: aspirin chewable tablet 324 mg  324 mg Oral NOW Dolores Patty, MD      . DISCONTD: aspirin chewable tablet 324 mg  324 mg Oral Pre-Cath Lyn Records III, MD      . DISCONTD: aspirin EC tablet 81 mg  81 mg Oral Daily Dolores Patty, MD      . DISCONTD: aspirin suppository 300 mg  300 mg Rectal NOW Dolores Patty, MD      . DISCONTD: ferrous sulfate tablet 325 mg  325 mg Oral BID WC Dolores Patty, MD      . DISCONTD: heparin ADULT infusion 100 units/mL (25000 units/250 mL)  900 Units/hr Intravenous Continuous April K Palumbo-Rasch, MD   900 Units/hr at 12/24/11 1100  . DISCONTD: losartan (COZAAR) tablet 100 mg  100 mg Oral Daily Dolores Patty, MD   100 mg at 12/24/11 1049  . DISCONTD: nitroGLYCERIN (NITROSTAT) SL tablet 0.4 mg  0.4 mg Sublingual Q5 min PRN April K Palumbo-Rasch, MD      . DISCONTD: nitroGLYCERIN (NITROSTAT) SL tablet 0.4 mg  0.4 mg Sublingual Q5 Min x 3 PRN Dolores Patty, MD      . DISCONTD: ondansetron (ZOFRAN) injection 4 mg  4 mg Intravenous Q6H PRN Dolores Patty, MD      . DISCONTD: potassium chloride (KLOR-CON) packet 20 mEq  20 mEq Oral TID Dolores Patty, MD      . DISCONTD: potassium chloride SA (K-DUR,KLOR-CON) CR tablet 40 mEq  40 mEq Oral Once Lyn Records III, MD      . DISCONTD: sodium chloride 0.9 % injection 3 mL  3 mL Intravenous Q12H Lyn Records III, MD      . DISCONTD: sodium chloride 0.9 % injection 3 mL  3 mL Intravenous PRN Lesleigh Noe, MD        Allergies  Allergen Reactions  . Ace Inhibitors Cough  . Ibuprofen Other (See Comments)     GI Issues    Review of Systems:  General:  Decreased appetite, decreased energy   Respiratory:  no cough, no wheezing, no hemoptysis, no pain  with inspiration or cough, no shortness of breath   Cardiac:  + chest pain or tightness, + exertional SOB, no resting SOB, no PND, no orthopnea, no LE edema, no palpitations, no syncope  GI:   no difficulty swallowing, no hematochezia, no hematemesis, no melena, + constipation, no diarrhea   GU:   no dysuria, no urgency, no frequency   Musculoskeletal: no arthritis, + mild arthralgia in lower back  Vascular:  + pain suggestive of claudication both legs L>R  Neuro:   no symptoms suggestive of TIA's, no seizures, no headaches, no peripheral neuropathy   Endocrine:  Negative - reports stable glycemic control  HEENT:  no loose teeth or painful teeth,  no recent vision changes  Psych:   no anxiety, no depression    Physical Exam:   BP 147/68  Pulse 68  Temp 98.1 F (36.7 C) (Oral)  Resp 14  Ht 5\' 9"  (1.753 m)  Wt 92.534 kg (204 lb)  BMI 30.13 kg/m2  SpO2 97%  General:  Thin and somewhat cachectic appearing  HEENT:  Unremarkable   Neck:   no JVD, no bruits, no adenopathy   Chest:   clear to auscultation, symmetrical breath sounds, no wheezes, no rhonchi, old sternotomy scar with stable sternum  CV:   RRR, no murmur   Abdomen:  soft, non-tender, no masses   Extremities:  warm, well-perfused, pulses not palpable, no edema, old scar RLE from SVG harvest  Rectal/GU  Deferred  Neuro:   Grossly non-focal and symmetrical throughout  Skin:   Clean and dry, no rashes, no breakdown   Diagnostic Tests:   Diagnostic Cardiac Catheterization Report  ALASDAIR KLEVE   76 y.o.   male 21-Jan-1936  Procedure Date: 12/24/2011 Referring Physician: Catha Gosselin, MD Primary Cardiologist:: Wayne Both, III, MD   PROCEDURE:  Left heart catheterization with selective coronary angiography, bypass graft angiography and left ventriculogram.  INDICATIONS:   Stable angina pectoris in this gentleman who is 76 years of age and has history of prior coronary grafting with SVG to RCA in 1994.  The risks, benefits, and details of the procedure were explained to the patient.  The patient verbalized understanding and wanted to proceed.  Informed written consent was obtained.  PROCEDURE TECHNIQUE:  After Xylocaine anesthesia a 5 sheath was placed in the right radial artery with a single anterior needle wall stick.   Coronary angiography was done using a 5 French A2 MP and 4cm 5 French left Judkins catheter.  Left ventriculography was done using a 5 [place] A2 MP catheter.      CONTRAST:  Total of 110 cc.  COMPLICATIONS:  None.    HEMODYNAMICS:  Aortic pressure was 155/71 mmHg; LV pressure was 155/0 mmHg; LVEDP 8 mm mercury.  There was no gradient between the left ventricle and aorta.    ANGIOGRAPHIC DATA:   The left main coronary artery is calcified diffusely, with eccentric 50% distal narrowing.  The left anterior descending artery is transapical. Irregularities are noted in the proximal and mid vessel up to 50% but no significant obstruction is seen. A large trifurcating diagonal branch arises from the proximal vessel. The largest branch contains a 50-70% stenosis. The second diagonal is large and widely patent. The distal LAD contain segmental 80% stenosis at the apex..  The left circumflex artery is heavily calcified and gives 2 large obtuse marginal branches before the first obtuse marginal branch there is eccentric 90% stenosis. The first and second marginal branches  are patent with the second marginal containing 80% mid vessel stenosis or the distal circumflex is 95% obstructed. The mid circumflex after the second obtuse marginal is 95% obstructed..  The right coronary artery is totally occluded proximally.  BYPASS Graft Angiography: The graft is severely diseased throughout a long segment in the mid to distal vessel. the anastomosis to the PDA is  widely patent. Marland Kitchen LEFT VENTRICULOGRAM:  Left ventricular angiogram was done in the 30 RAO projection and revealed normal left ventricular wall motion and systolic function with an estimated ejection fraction of 75 %.  LVEDP was 8 mmHg mmHg.    EKG - NSR with no ischemic changes  Results for TRUMAINE, WIMER (MRN [id number]) as of 12/24/2011 17:36  Ref. Range 12/24/2011 02:10 12/24/2011 02:25 12/24/2011 02:27 12/24/2011 06:35 12/24/2011 15:42  CK, MB Latest Range: 0.3-4.0 ng/mL    2.0 2.7  CK Total Latest Range: 7-232 U/L    146 138  Troponin I Latest Range: <0.30 ng/mL    <0.30 <0.30  Troponin i, poc Latest Range: 0.00-0.08 ng/mL  0.00      Results for RYUN, VELEZ (MRN [id number]) as of 12/24/2011 17:36  Ref. Range 12/24/2011 02:27  Sodium Latest Range: 135-145 mEq/L 144  Potassium Latest Range: 3.5-5.1 mEq/L 2.9 (L)  Chloride Latest Range: 96-112 mEq/L 102  BUN Latest Range: 6-23 mg/dL 17  Creat Latest Range: 0.50-1.35 mg/dL 7.82  Glucose Latest Range: 70-99 mg/dL 956 (H)  Calcium Ionized Latest Range: 1.12-1.32 mmol/L 1.15    Results for LENELL, LAMA (MRN [id number]) as of 12/24/2011 17:36  Ref. Range 12/24/2011 02:10  WBC Latest Range: 4.0-10.5 K/uL 9.0  RBC Latest Range: 4.22-5.81 MIL/uL 2.90 (L)  Hemoglobin Latest Range: 13.0-17.0 g/dL 46.9 (L)  HCT Latest Range: 39.0-52.0 % 29.5 (L)  MCV Latest Range: 78.0-100.0 fL 101.7 (H)  MCH Latest Range: 26.0-34.0 pg 34.5 (H)  MCHC Latest Range: 30.0-36.0 g/dL 62.9  RDW Latest Range: 11.5-15.5 % 14.2  Platelets Latest Range: 150-400 K/uL 190    Impression:  The patient has severe three-vessel coronary disease with severe vein graft disease involving a patent vein graft to the posterior descending coronary artery. There is now significant left main coronary artery stenosis with complex high grade proximal stenosis of the left circumflex coronary artery. Left ventricular function is preserved.  The patient has numerous comorbid  medical problems including hypertension, diabetes, hyperlipidemia, mild renal insufficiency, paroxysmal atrial fibrillation and severe peripheral vascular disease with ongoing symptoms of severe claudication as well as possible chronic mesenteric ischemia.  I expect the patient would best be treated with redo coronary artery bypass grafting, the risks will certainly be elevated. It might be prudent to consider either CT angiogram or catheterization of the mesenteric vessels to make certain that the patient isn't at very high-risk for acute mesenteric ischemia as a complication of redo coronary artery bypass grafting.   Plan:  Will proceed with routine preoperative diagnostic workup and discuss this case further with Dr. Laneta Simmers, Dr. [name] and Dr. [name] early next week.    Salvatore Decent. Cornelius Moras, MD 12/24/2011 5:37 PM

## 2011-12-24 NOTE — Progress Notes (Addendum)
[name] is a very complicated and high risk patient whose care I have assumed from Dr. Aleen Campi over the past 18 months. He has severe peripheral vascular disease mesenteric disease and has had previous bypass surgery. He is having severe claudication and has an upcoming procedure with Dr. [name]. He has had lower extremity bypass graft placement he has a history of paroxysmal atrial fibrillation and is on suppression with amiodarone. There is also a history of diastolic heart failure. He presented with a story that is compatible with prolonged angina. He mentioned that his heart rate was increased raising the question of whether he was having arrhythmia. EKGs did not demonstrate arrhythmia although he did have mild ST segment depression diffusely catheterization will be difficult due to limited access in the setting of bypass grafts. The most likely approach would be left radial. I will need to review his images and discussed in details of procedure and higher than normal risk involved of embolic complications, renal impairment, and death. He is willing to proceed if I feel it is indicated.  I have reviewed records and prior studies. He has a single SVG to RAC. Had 40-50% left main in 2001. Will perform the procedure either from the left radial or fight femoral. Suspect progression of RCA disease. High risk for complications as discussed above. Pt understands the increased risk of CVA, vascular occlusion, MI, death, ischemic limb, renal impairment, and bleeding.

## 2011-12-24 NOTE — ED Provider Notes (Signed)
History     CSN: [id number]  Arrival date & time 12/24/11  0150   First MD Initiated Contact with Patient 12/24/11 0155      Chief Complaint  Patient presents with  . Chest Pain    (Consider location/radiation/quality/duration/timing/severity/associated sxs/prior treatment) Patient is a 76 y.o. male presenting with chest pain. The history is provided by the patient and the EMS personnel.  Chest Pain The chest pain began 12 - 24 hours ago. Duration of episode(s) is 13 hours. Chest pain occurs constantly. The chest pain is improving. Associated with: nothing. At its most intense, the pain is at 8/10. The pain is currently at 4/10. The severity of the pain is severe. The pain does not radiate. Exacerbated by: nothing. Primary symptoms include shortness of breath, nausea and vomiting. Pertinent negatives for primary symptoms include no syncope, no cough, no palpitations, no dizziness and no altered mental status.  Associated symptoms include weakness.  Pertinent negatives for associated symptoms include no diaphoresis and no numbness. He tried nitroglycerin for the symptoms. Risk factors include being elderly and male gender.  His past medical history is significant for CAD.  Pertinent negatives for family medical history include: no Marfan's syndrome in family.  Procedure history is positive for cardiac catheterization.     Past Medical History  Diagnosis Date  . Diabetes mellitus   . CAD (coronary artery disease)   . Hypertension   . Anemia   . Peripheral vascular disease   . Hyperlipidemia   . Cancer     prostate  . Ulcer   . GERD (gastroesophageal reflux disease)   . Liddle's syndrome   . Hemorrhoids   . Intestinal ischemia   . DVT (deep venous thrombosis)   . Atrial fibrillation   . CHF (congestive heart failure)   . Myocardial infarction     Past Surgical History  Procedure Date  . Hiatal hernia repair     and ulcer repair  . Appendectomy   . Pacemaker insertion    . Coronary artery bypass graft 01/22/1993  . Cholecystectomy Oct 2009    Gall Bladder    Family History  Problem Relation Age of Onset  . Diabetes Mother   . Heart disease Mother     Heart Disease before age 97  . Hyperlipidemia Mother   . Hypertension Mother   . Cancer Father   . Cancer Sister   . Diabetes Sister   . Heart disease Daughter     Heart Disease before age 22  . Hypertension Daughter   . Heart attack Daughter     History  Substance Use Topics  . Smoking status: Former Smoker    Types: Cigarettes    Quit date: 07/12/1977  . Smokeless tobacco: Not on file  . Alcohol Use: No      Review of Systems  Constitutional: Negative for diaphoresis.  Respiratory: Positive for chest tightness and shortness of breath. Negative for cough.   Cardiovascular: Positive for chest pain. Negative for palpitations and syncope.  Gastrointestinal: Positive for nausea and vomiting.  Neurological: Positive for weakness. Negative for dizziness and numbness.  Psychiatric/Behavioral: Negative for altered mental status.  All other systems reviewed and are negative.    Allergies  Ace inhibitors and Ibuprofen  Home Medications   Current Outpatient Rx  Name Route Sig Dispense Refill  . ACETAMINOPHEN ER 650 MG PO TBCR Oral Take 1,300 mg by mouth 2 (two) times daily.     . AMIODARONE HCL 200 MG PO  TABS Oral Take 100 mg by mouth daily.     Marland Kitchen AMLODIPINE BESYLATE 5 MG PO TABS Oral Take 5 mg by mouth daily.     . ASPIRIN EC 81 MG PO TBEC Oral Take 81 mg by mouth daily.     . ATORVASTATIN CALCIUM 80 MG PO TABS Oral Take 80 mg by mouth daily.    Marland Kitchen CALCIUM PO Oral Take 2,000 mg by mouth daily.     Marland Kitchen VITAMIN D 1000 UNITS PO TABS Oral Take 2,000 Units by mouth daily.     Marland Kitchen CLONIDINE HCL 0.2 MG PO TABS Oral Take 0.2 mg by mouth 2 (two) times daily.     Marland Kitchen DILTIAZEM HCL ER COATED BEADS 180 MG PO CP24 Oral Take 360 mg by mouth daily.     . FUROSEMIDE 80 MG PO TABS Oral Take 40 mg by mouth  daily.     Marland Kitchen LOSARTAN POTASSIUM 100 MG PO TABS Oral Take 100 mg by mouth daily.     Marland Kitchen METFORMIN HCL 500 MG PO TABS Oral Take 1,000 mg by mouth 2 (two) times daily with a meal.     . NITROGLYCERIN 0.4 MG SL SUBL Sublingual Place 0.4 mg under the tongue every 5 (five) minutes as needed. For chest pain    . OMEPRAZOLE 20 MG PO CPDR Oral Take 20 mg by mouth daily.     Marland Kitchen PANCRELIPASE (LIP-PROT-AMYL) 5000 UNITS PO CPEP Oral Take 2 capsules by mouth 3 (three) times daily.     Marland Kitchen POTASSIUM CHLORIDE 20 MEQ PO PACK Oral Take 20 mEq by mouth 3 (three) times daily.     . WARFARIN SODIUM 5 MG PO TABS Oral Take 2.5-5 mg by mouth daily. Take 2.5mg  on Saturday, take 5mg  all other days      BP 175/80  Pulse 94  Temp 99 F (37.2 C) (Oral)  Resp 16  SpO2 98%  Physical Exam  Constitutional: He appears well-developed and well-nourished.  HENT:  Head: Normocephalic and atraumatic.  Mouth/Throat: Oropharynx is clear and moist.  Eyes: Conjunctivae are normal. Pupils are equal, round, and reactive to light.  Neck: Normal range of motion. Neck supple.  Cardiovascular: Normal rate and regular rhythm.   Pulmonary/Chest: Effort normal and breath sounds normal. He has no wheezes. He has no rales.  Abdominal: Soft. Bowel sounds are normal. There is no tenderness. There is no rebound and no guarding.  Musculoskeletal: Normal range of motion. He exhibits no tenderness.  Neurological: He is alert. He has normal reflexes.  Skin: Skin is warm and dry.  Psychiatric: He has a normal mood and affect.    ED Course  Procedures (including critical care time)   Labs Reviewed  CBC  DIFFERENTIAL  PROTIME-INR   No results found.   No diagnosis found.    MDM   Date: 12/24/2011  Rate:87  Rhythm: normal sinus rhythm  QRS Axis: normal  Intervals: normal  ST/T Wave abnormalities: ST depressions inferiorly and ST depressions laterally  Conduction Disutrbances:none  Narrative Interpretation:   Old EKG Reviewed:  changes noted    Case d/w Dr. Teressa Lower, nitro drip, IV heparin, admit to step down, control HR and BP MDM Reviewed: nursing note and vitals Reviewed previous: ECG Interpretation: labs, ECG and x-ray Total time providing critical care: 30-74 minutes. This excludes time spent performing separately reportable procedures and services. Consults: cardiology    CRITICAL CARE Performed by: Jasmine Awe   Total critical care time: 60 minutes  Critical  care time was exclusive of separately billable procedures and treating other patients.  Critical care was necessary to treat or prevent imminent or life-threatening deterioration.  Critical care was time spent personally by me on the following activities: development of treatment plan with patient and/or surrogate as well as nursing, discussions with consultants, evaluation of patient's response to treatment, examination of patient, obtaining history from patient or surrogate, ordering and performing treatments and interventions, ordering and review of laboratory studies, ordering and review of radiographic studies, pulse oximetry and re-evaluation of patient's condition.   Jasmine Awe, MD 12/24/11 [phone]

## 2011-12-24 NOTE — ED Notes (Signed)
Family at bedside. 

## 2011-12-24 NOTE — Progress Notes (Signed)
TR BAND REMOVAL  LOCATION:  left radial  DEFLATED PER PROTOCOL:  yes  TIME BAND OFF / DRESSING APPLIED:   1600   SITE UPON ARRIVAL:   Level 0  SITE AFTER BAND REMOVAL:  Level 0  REVERSE ALLEN'S TEST:    positive  CIRCULATION SENSATION AND MOVEMENT:  Within Normal Limits  yes  COMMENTS:    

## 2011-12-24 NOTE — H&P (Addendum)
[facility] ADMIT NOTE  Cardiologist: Dr. [name]  Reason for admit: CP and HTN  HPI:  [name] is a very pleasant 76 y/o male with multiple medical problems including PAD (s/p mesenteric stents), HTN, PAF, DM2 and CAD. He was previously followed by Dr. Aleen Campi and is now followed by Dr. [name].  Underwent CABG in 1994 (details unavailable). No cath in at least 3 years. Over past several months struggling with claudication and intestinal angina c/b weight loss. Legs start hurting (L>R) only after a few steps but can walk 4 blocks. Saw Dr. [name] and scheduled for angio on 6/18.  Last night developed tightness across back and into chest. SBP ~170. Came to ER. ECG with 1mm ST depression in inferolateral leads. POC trop -. Treated with ASA, heparin, IV lopressor and IV NTG. CP nearly resolved. F/u ECG with resolution of ST depression. INR 1.6. Now feels better.   Review of Systems:     Cardiac Review of Systems: {Y] = yes [ ]  = no  Chest Pain [  y  ]  Resting SOB [   ] Exertional SOB  [  ]  Orthopnea [  ]   Pedal Edema [   ]    Palpitations [  ] Syncope  [  ]   Presyncope [   ]  General Review of Systems: [Y] = yes [  ]=no Constitional: recent weight change [place]ienne  ]; anorexia [ y ]; fatigue [  ]; nausea [  ]; night sweats [  ]; fever [  ]; or chills [  ];                                                                                                                                          Dental: poor dentition[  ];  Eye : blurred vision [  ]; diplopia [   ]; vision changes [  ];  Amaurosis fugax[  ]; Resp: cough [  ];  wheezing[  ];  hemoptysis[  ]; shortness of breath[  ]; paroxysmal nocturnal dyspnea[  ]; dyspnea on exertion[  ]; or orthopnea[  ];  GI:  gallstones[  ], vomiting[  ];  dysphagia[  ]; melena[  ];  hematochezia [  ]; heartburn[  ];    GU: kidney stones [  ]; hematuria[  ];   dysuria [  ];  nocturia[  ];  history of     obstruction [  ];                 Skin: rash,  swelling[  ];, hair loss[  ];  peripheral edema[  ];  or itching[  ]; Musculosketetal: myalgias[  ];  joint swelling[  ];  joint erythema[  ];  joint pain[  ];  back pain[  ];  Heme/Lymph: bruising[  ];  bleeding[  ];  anemia[  ];  Neuro: TIA[  ];  headaches[  ];  stroke[  ];  vertigo[  ];  seizures[  ];   paresthesias[  ];  difficulty walking[y  ];  Psych:depression[  ]; anxiety[  ];  Endocrine: diabetes[ y ];  thyroid dysfunction[  ];   Other:  Past Medical History  Diagnosis Date  . Diabetes mellitus   . CAD (coronary artery disease)     s/b CABG '94  . Hypertension   . Anemia     Macrocytic  . Peripheral vascular disease   . Hyperlipidemia   . Cancer     prostate  . Ulcer   . GERD (gastroesophageal reflux disease)   . Liddle's syndrome   . Hemorrhoids   . Intestinal ischemia   . DVT (deep venous thrombosis)   . Atrial fibrillation     Paroxysmal. S/p PPM  . CHF (congestive heart failure)     Medications Prior to Admission  Medication Sig Dispense Refill  . acetaminophen (ARTHRITIS PAIN RELIEF) 650 MG CR tablet Take 1,300 mg by mouth 2 (two) times daily.       Marland Kitchen amiodarone (PACERONE) 200 MG tablet Take 100 mg by mouth daily.       Marland Kitchen amLODipine (NORVASC) 5 MG tablet Take 5 mg by mouth daily.       Marland Kitchen aspirin EC 81 MG tablet Take 81 mg by mouth daily.       Marland Kitchen atorvastatin (LIPITOR) 80 MG tablet Take 80 mg by mouth daily.      Marland Kitchen CALCIUM PO Take 2,000 mg by mouth daily.       . cholecalciferol (VITAMIN D) 1000 UNITS tablet Take 1,000 Units by mouth daily.       . cloNIDine (CATAPRES) 0.2 MG tablet Take 0.2 mg by mouth 2 (two) times daily.       Marland Kitchen diltiazem (CARDIZEM CD) 180 MG 24 hr capsule Take 360 mg by mouth daily.       . ferrous sulfate 325 (65 FE) MG tablet Take 325 mg by mouth 2 (two) times daily.      . furosemide (LASIX) 80 MG tablet Take 40 mg by mouth daily.       Marland Kitchen Leuprolide Acetate (LUPRON DEPOT IM) Inject into the muscle See admin instructions. Every 4  months as needed      . losartan (COZAAR) 100 MG tablet Take 100 mg by mouth daily.       . metFORMIN (GLUCOPHAGE) 500 MG tablet Take 1,000 mg by mouth 2 (two) times daily with a meal.       . nitroGLYCERIN (NITROSTAT) 0.4 MG SL tablet Place 0.4 mg under the tongue every 5 (five) minutes as needed. For chest pain      . omeprazole (PRILOSEC) 20 MG capsule Take 20 mg by mouth daily.       . Pancrelipase, Lip-Prot-Amyl, 5000 UNITS CPEP Take 2 capsules by mouth 3 (three) times daily.       . potassium chloride (KLOR-CON) 20 MEQ packet Take 20 mEq by mouth 3 (three) times daily.       . silodosin (RAPAFLO) 8 MG CAPS capsule Take 8 mg by mouth daily with breakfast.      . warfarin (COUMADIN) 5 MG tablet Take 2.5-5 mg by mouth daily. Take 2.5mg  on Saturday, take 5mg  all other days         Allergies  Allergen Reactions  . Ace Inhibitors Cough  . Ibuprofen Other (See Comments)  GI Issues    History   Social History  . Marital Status: Married    Spouse Name: N/A    Number of Children: N/A  . Years of Education: N/A   Occupational History  . Not on file.   Social History Main Topics  . Smoking status: Former Smoker    Types: Cigarettes    Quit date: 07/12/1977  . Smokeless tobacco: Not on file  . Alcohol Use: No  . Drug Use: No  . Sexually Active:    Other Topics Concern  . Not on file   Social History Narrative  . No narrative on file    Family History  Problem Relation Age of Onset  . Diabetes Mother   . Heart disease Mother     Heart Disease before age 2  . Hyperlipidemia Mother   . Hypertension Mother   . Cancer Father   . Cancer Sister   . Diabetes Sister   . Heart disease Daughter     Heart Disease before age 20  . Hypertension Daughter   . Heart attack Daughter     PHYSICAL EXAM: Filed Vitals:   12/24/11 0518  BP: 171/76  Pulse: 94  Temp: 98.4 F (36.9 C)  Resp:    General:  Elderly no acute distress No respiratory difficulty HEENT:  normal Neck: supple. no JVD. Carotids 2+ bilat; + R bruits. No lymphadenopathy or thryomegaly appreciated. Cor: PMI nondisplaced. Regular rate & rhythm. +s4. 2/6 SEM RSB. s2 crisp Lungs: clear Abdomen: soft, nontender, nondistended. No hepatosplenomegaly.+ bruit. Good bowel sounds. Extremities: no cyanosis, clubbing, rash, edema. Femoral pulses 1+ bilaterally. + bilateral bruits Neuro: alert & oriented x 3, cranial nerves grossly intact. moves all 4 extremities w/o difficulty. Affect pleasant.  ECG: NSR inferolateral ST depression 1mm (new) resolved on f/u ECG  Results for orders placed during the hospital encounter of 12/24/11 (from the past 24 hour(s))  CBC     Status: Abnormal   Collection Time   12/24/11  2:10 AM      Component Value Range   WBC 9.0  4.0 - 10.5 K/uL   RBC 2.90 (*) 4.22 - 5.81 MIL/uL   Hemoglobin 10.0 (*) 13.0 - 17.0 g/dL   HCT 16.1 (*) 09.6 - 04.5 %   MCV 101.7 (*) 78.0 - 100.0 fL   MCH 34.5 (*) 26.0 - 34.0 pg   MCHC 33.9  30.0 - 36.0 g/dL   RDW 40.9  81.1 - 91.4 %   Platelets 190  150 - 400 K/uL  DIFFERENTIAL     Status: Normal   Collection Time   12/24/11  2:10 AM      Component Value Range   Neutrophils Relative 76  43 - 77 %   Neutro Abs 6.8  1.7 - 7.7 K/uL   Lymphocytes Relative 15  12 - 46 %   Lymphs Abs 1.3  0.7 - 4.0 K/uL   Monocytes Relative 9  3 - 12 %   Monocytes Absolute 0.8  0.1 - 1.0 K/uL   Eosinophils Relative 0  0 - 5 %   Eosinophils Absolute 0.0  0.0 - 0.7 K/uL   Basophils Relative 0  0 - 1 %   Basophils Absolute 0.0  0.0 - 0.1 K/uL  PROTIME-INR     Status: Abnormal   Collection Time   12/24/11  2:10 AM      Component Value Range   Prothrombin Time 19.5 (*) 11.6 - 15.2 seconds   INR  1.62 (*) 0.00 - 1.49  POCT I-STAT TROPONIN I     Status: Normal   Collection Time   12/24/11  2:25 AM      Component Value Range   Troponin i, poc 0.00  0.00 - 0.08 ng/mL   Comment 3           POCT I-STAT, CHEM 8     Status: Abnormal   Collection Time    12/24/11  2:27 AM      Component Value Range   Sodium 144  135 - 145 mEq/L   Potassium 2.9 (*) 3.5 - 5.1 mEq/L   Chloride 102  96 - 112 mEq/L   BUN 17  6 - 23 mg/dL   Creatinine, Ser 6.57  0.50 - 1.35 mg/dL   Glucose, Bld 846 (*) 70 - 99 mg/dL   Calcium, Ion 9.62  9.52 - 1.32 mmol/L   TCO2 28  0 - 100 mmol/L   Hemoglobin 10.5 (*) 13.0 - 17.0 g/dL   HCT 84.1 (*) 32.4 - 40.1 %   Dg Chest 2 View  12/24/2011  *RADIOLOGY REPORT*  Clinical Data: Chest pain  CHEST - 2 VIEW  Comparison: 12/23/2028  Findings: Linear lucency resolve with repeat radiograph.  Improved aeration with mild linear residual right lower lobe opacity. Prominent cardiomediastinal contours status post median sternotomy and CABG.  Aortic arch atherosclerosis.  Right chest wall battery pack with dual leads, unchanged in position. Multilevel degenerative change however no definite acute osseous finding.  IMPRESSION: No pneumothorax.  Mild right lung base scarring versus atelectasis.  Original Report Authenticated By: Waneta Martins, M.D.   Dg Chest Portable 1 View  12/24/2011  *RADIOLOGY REPORT*  Clinical Data: Chest pain, shortness of breath.  PORTABLE CHEST - 1 VIEW  Comparison: 07/02/2009 CT  Findings: Right chest wall battery pack with lead tips projecting over the right atrium and right ventricle.  Status post median sternotomy and CABG. Aortic arch atherosclerosis.  Lucency projecting over the left lung apex is favored to represent skin fold.  Linear right lung base opacity.  Mild left lung base opacity as well.  No definite pleural effusion.  No acute osseous finding.  IMPRESSION: Linear lucency projecting over the left upper lobe is favored to represent a skin fold however recommend PA and lateral radiographs to exclude pneumothorax.  Prominent cardiomediastinal contours, similar to prior.  Bibasilar opacities; atelectasis versus infiltrate.  Discussed via telephone with Dr. Nicanor Alcon at 02:25 a.m. on 12/24/2011.  Original Report  Authenticated By: Waneta Martins, M.D.     ASSESSMENT: 1. Unstable angina 2. Malignant HTN 3. CAD s/p CABG 1994 4. PAF - in sinus rhythm on amio 5. PAD with intermittent claudication and mesenteric ischemia      --rending angio 6/18 with Dr. [name] 6. Anemia, macrocytic 7. DM2 8. Hypokalemia 9. Chronic renal failure, stage III   PLAN/DISCUSSION:  Symptoms and ECG very concerning for [place] in the setting of significant HTN. Will admit to stepdown. Continue heparin, ASA, b-blocker, IV NTG and statin. Will not load Plavix at this point given long term need for coumadin. Control HTN. Supp K+. Will likely need cath later today after Dr. [name] evaluates. Hold metformin and lasix.   Makinzi Prieur,MD 6:01 AM

## 2011-12-24 NOTE — ED Notes (Signed)
From home. Complains of non-radiating mid-chest pain since noon yesterday. Describes as a tightness with nausea and vomiting x 1. Per EMS, received nitro x 2, ASA 324 mg and Zofran 4 mg. Per EMS, pt has a demand pacemaker that was actively pacing at their arrival. Pain initially 8/10 and decreased to 5/10 after medication administration. Pt currently states that the pain is better but still there. AAOx4, resp e/u, NAD

## 2011-12-24 NOTE — Progress Notes (Signed)
Patient being transferred down to 2506 after having cardiac cath. Patient's belongings have been taken down to new room.

## 2011-12-24 NOTE — Telephone Encounter (Signed)
Patient called the on-call pager c/o chest tightness and HTN with BP 157/95. Advised him to come to ER for further evaluation.

## 2011-12-24 NOTE — Progress Notes (Signed)
ANTICOAGULATION CONSULT NOTE - Initial Consult  Pharmacy Consult for Heparin Indication: chest pain/ACS, s/p cath  Allergies  Allergen Reactions  . Ace Inhibitors Cough  . Ibuprofen Other (See Comments)    GI Issues    Patient Measurements: Height: 5\' 9"  (175.3 cm) Weight: 204 lb (92.534 kg) IBW/kg (Calculated) : 70.7  Heparin Dosing Weight: 72 kg   Vital Signs: Temp: 97.4 F (36.3 C) (06/14 1423) Temp src: Oral (06/14 1423) BP: 146/71 mmHg (06/14 1423) Pulse Rate: 70  (06/14 1423)  Labs:  Basename 12/24/11 0635 12/24/11 0227 12/24/11 0210  HGB -- 10.5* 10.0*  HCT -- 31.0* 29.5*  PLT -- -- 190  APTT -- -- --  LABPROT -- -- 19.5*  INR -- -- 1.62*  HEPARINUNFRC -- -- --  CREATININE -- 1.30 --  CKTOTAL 146 -- --  CKMB 2.0 -- --  TROPONINI <0.30 -- --    Estimated Creatinine Clearance: 56 ml/min (by C-G formula based on Cr of 1.3).   Medical History: Past Medical History  Diagnosis Date  . Diabetes mellitus   . CAD (coronary artery disease)     s/b CABG '94  . Hypertension   . Anemia     Macrocytic  . Peripheral vascular disease   . Hyperlipidemia   . Cancer     prostate  . Ulcer   . GERD (gastroesophageal reflux disease)   . Liddle's syndrome   . Hemorrhoids   . Intestinal ischemia   . DVT (deep venous thrombosis)   . Atrial fibrillation     Paroxysmal. S/p PPM  . CHF (congestive heart failure)     Assessment: 61 to male with chest pain was on heparin gtt prior to cath. Cath revealed significant left coronary disease, pending decision on surgical revascularization. Pharmacy is consulted to resume heparin infusion 8 hours post sheath removal (1315).    Goal of Therapy:  Heparin level 0.3-0.7 units/ml Monitor platelets by anticoagulation protocol: Yes   Plan:  - Start heparin infusion 900 units/hr (no bolus) at 2115 - Check heparin level in 8 hours after infusion start - f/u daily cbc, and plan for CABG  Bayard Hugger, PharmD, BCPS  Clinical  Pharmacist  Pager: [phone]  12/24/2011,2:49 PM

## 2011-12-24 NOTE — ED Notes (Signed)
Patient transported to X-ray 

## 2011-12-24 NOTE — CV Procedure (Addendum)
     Diagnostic Cardiac Catheterization Report  [name]  76 y.o.  male May 22, 1936  Procedure Date: 12/24/2011 Referring Physician: Catha Gosselin, MD Primary Cardiologist:: Wayne Both, III, MD   PROCEDURE:  Left heart catheterization with selective coronary angiography, bypass graft angiography and left ventriculogram.  INDICATIONS:  Stable angina pectoris in this gentleman who is 76 years of age and has history of prior coronary grafting with SVG to RCA in 1994.  The risks, benefits, and details of the procedure were explained to the patient.  The patient verbalized understanding and wanted to proceed.  Informed written consent was obtained.  PROCEDURE TECHNIQUE:  After Xylocaine anesthesia a 5 sheath was placed in the right radial artery with a single anterior needle wall stick.   Coronary angiography was done using a 5 French A2 MP and 4cm 5 French left Judkins catheter.  Left ventriculography was done using a 5 [place] A2 MP catheter.    CONTRAST:  Total of 110 cc.  COMPLICATIONS:  None.    HEMODYNAMICS:  Aortic pressure was 155/71 mmHg; LV pressure was 155/0 mmHg; LVEDP 8 mm mercury.  There was no gradient between the left ventricle and aorta.    ANGIOGRAPHIC DATA:   The left main coronary artery is calcified diffusely, with eccentric 50% distal narrowing.  The left anterior descending artery is transapical. Irregularities are noted in the proximal and mid vessel up to 50% but no significant obstruction is seen. A large trifurcating diagonal branch arises from the proximal vessel. The largest branch contains a 50-70% stenosis. The second diagonal is large and widely patent. The distal LAD contain segmental 80% stenosis at the apex..  The left circumflex artery is heavily calcified and gives 2 large obtuse marginal branches before the first obtuse marginal branch there is eccentric 90% stenosis. The first and second marginal branches are patent with the second marginal  containing 80% mid vessel stenosis or the distal circumflex is 95% obstructed. The mid circumflex after the second obtuse marginal is 95% obstructed..  The right coronary artery is totally occluded proximally.  BYPASS Graft Angiography: The graft is severely diseased throughout a long segment in the mid to distal vessel. the anastomosis to the PDA is widely patent. Marland Kitchen LEFT VENTRICULOGRAM:  Left ventricular angiogram was done in the 30 RAO projection and revealed normal left ventricular wall motion and systolic function with an estimated ejection fraction of 75 %.  LVEDP was 8 mmHg mmHg.  IMPRESSIONS:  1. Unstable angina due to segmental high grade disease in the saphenous vein graft to the PDA.  2. Significant left coronary disease with 40-60% eccentric distal left main stenosis, 90% proximal circumflex stenosis, and severe disease in the distal circumflex. The LAD is widely patent throughout its proximal and mid segment.  3. Overall normal left ventricular function  RECOMMENDATION:  We'll review the patient's images with colleagues and consult Dr. Laneta Simmers, TCTS,  to consider repeat surgical revascularization.  PCI could be performed on the saphenous vein graft at increased risk. The circumflex PCI would be difficult. Will discuss.  Follow renal function.Marland Kitchen

## 2011-12-24 NOTE — Progress Notes (Signed)
ANTICOAGULATION CONSULT NOTE - Initial Consult  Pharmacy Consult for Heparin Indication: chest pain/ACS  Allergies  Allergen Reactions  . Ace Inhibitors Cough  . Ibuprofen Other (See Comments)    GI Issues    Patient Measurements:   Heparin Dosing Weight: 72 kg   Vital Signs: Temp: 99 F (37.2 C) (06/14 0210) Temp src: Oral (06/14 0210) BP: 172/86 mmHg (06/14 0415) Pulse Rate: 83  (06/14 0345)  Labs:  Alvira Philips 12/24/11 0227 12/24/11 0210  HGB 10.5* 10.0*  HCT 31.0* 29.5*  PLT -- 190  APTT -- --  LABPROT -- 19.5*  INR -- 1.62*  HEPARINUNFRC -- --  CREATININE 1.30 --  CKTOTAL -- --  CKMB -- --  TROPONINI -- --    The CrCl is unknown because both a height and weight (above a minimum accepted value) are required for this calculation.   Medical History: Past Medical History  Diagnosis Date  . Diabetes mellitus   . CAD (coronary artery disease)   . Hypertension   . Anemia   . Peripheral vascular disease   . Hyperlipidemia   . Cancer     prostate  . Ulcer   . GERD (gastroesophageal reflux disease)   . Liddle's syndrome   . Hemorrhoids   . Intestinal ischemia   . DVT (deep venous thrombosis)   . Atrial fibrillation   . CHF (congestive heart failure)   . Myocardial infarction     Medications:  Amiodarone  Norvasc  ASA  Lipitor  Diltiazem  Lasix  Cozaar  Metformin  NTG  KCl APAP  Oscal  Iron  Vit D  Prilosec Coumadin  7.5 mg MF, 5 mg TWRSS  Assessment: 7 to male with chest pain for Heparin.  On Coumadin for h/o Afib, INR 1.62.  Goal of Therapy:  Heparin level 0.3-0.7 units/ml Monitor platelets by anticoagulation protocol: Yes   Plan:  Heparin 2000 units IV bolus, then 900 units/hr Check heparin level in 8 hours.  Eddie Candle 12/24/2011,5:11 AM

## 2011-12-25 ENCOUNTER — Encounter (HOSPITAL_COMMUNITY): Payer: Self-pay | Admitting: *Deleted

## 2011-12-25 ENCOUNTER — Inpatient Hospital Stay (HOSPITAL_COMMUNITY): Payer: Medicare Other

## 2011-12-25 LAB — GLUCOSE, CAPILLARY: Glucose-Capillary: 139 mg/dL — ABNORMAL HIGH (ref 70–99)

## 2011-12-25 LAB — PROTIME-INR
INR: 1.54 — ABNORMAL HIGH (ref 0.00–1.49)
Prothrombin Time: 18.8 seconds — ABNORMAL HIGH (ref 11.6–15.2)

## 2011-12-25 LAB — PREALBUMIN: Prealbumin: 23.5 mg/dL (ref 17.0–34.0)

## 2011-12-25 LAB — URINALYSIS, ROUTINE W REFLEX MICROSCOPIC
Leukocytes, UA: NEGATIVE
Protein, ur: 100 mg/dL — AB
Urobilinogen, UA: 1 mg/dL (ref 0.0–1.0)

## 2011-12-25 LAB — COMPREHENSIVE METABOLIC PANEL
ALT: 7 U/L (ref 0–53)
Calcium: 8.6 mg/dL (ref 8.4–10.5)
GFR calc Af Amer: 58 mL/min — ABNORMAL LOW (ref >90)
Glucose, Bld: 133 mg/dL — ABNORMAL HIGH (ref 70–99)
Sodium: 141 mEq/L (ref 135–145)
Total Protein: 6 g/dL (ref 6.0–8.3)

## 2011-12-25 LAB — BLOOD GAS, ARTERIAL
Acid-Base Excess: 1.9 mmol/L (ref 0.0–2.0)
Drawn by: 347621
FIO2: 0.21 %
pCO2 arterial: 39.7 mmHg (ref 35.0–45.0)
pO2, Arterial: 74.5 mmHg — ABNORMAL LOW (ref 80.0–100.0)

## 2011-12-25 LAB — CBC
Hemoglobin: 8.3 g/dL — ABNORMAL LOW (ref 13.0–17.0)
MCHC: 33.1 g/dL (ref 30.0–36.0)
RBC: 2.48 MIL/uL — ABNORMAL LOW (ref 4.22–5.81)
WBC: 6.9 10*3/uL (ref 4.0–10.5)

## 2011-12-25 LAB — BASIC METABOLIC PANEL
BUN: 17 mg/dL (ref 6–23)
Calcium: 8.8 mg/dL (ref 8.4–10.5)
GFR calc Af Amer: 55 mL/min — ABNORMAL LOW (ref >90)
GFR calc non Af Amer: 48 mL/min — ABNORMAL LOW (ref >90)
Potassium: 3.4 mEq/L — ABNORMAL LOW (ref 3.5–5.1)
Sodium: 141 mEq/L (ref 135–145)

## 2011-12-25 LAB — URINE MICROSCOPIC-ADD ON

## 2011-12-25 LAB — HEPARIN LEVEL (UNFRACTIONATED): Heparin Unfractionated: 0.32 IU/mL (ref 0.30–0.70)

## 2011-12-25 NOTE — Progress Notes (Signed)
ANTICOAGULATION CONSULT NOTE  Pharmacy Consult for Heparin Indication: chest pain/ACS, s/p cath  Allergies  Allergen Reactions  . Ace Inhibitors Cough  . Ibuprofen Other (See Comments)    GI Issues    Patient Measurements: Height: 5\' 9"  (175.3 cm) Weight: 148 lb (67.132 kg) IBW/kg (Calculated) : 70.7  Heparin Dosing Weight: 72 kg   Vital Signs: Temp: 99 F (37.2 C) (06/15 0400) Temp src: Oral (06/15 0400) BP: 103/56 mmHg (06/15 0400) Pulse Rate: 61  (06/15 0400)  Labs:  Basename 12/25/11 0416 12/24/11 1542 12/24/11 0635 12/24/11 0227 12/24/11 0210  HGB 8.3* -- -- 10.5* --  HCT 25.1* -- -- 31.0* 29.5*  PLT 165 -- -- -- 190  APTT -- -- -- -- --  LABPROT 18.8* -- -- -- 19.5*  INR 1.54* -- -- -- 1.62*  HEPARINUNFRC 0.32 -- -- -- --  CREATININE 1.35 -- -- 1.30 --  CKTOTAL -- 138 146 -- --  CKMB -- 2.7 2.0 -- --  TROPONINI -- <0.30 <0.30 -- --    Estimated Creatinine Clearance: 45.6 ml/min (by C-G formula based on Cr of 1.35).  Assessment: 9 to male with CAD s/p cath, awaiting possible re-do CABG, for heparin.   Goal of Therapy:  Heparin level 0.3-0.7 units/ml Monitor platelets by anticoagulation protocol: Yes   Plan:  -Continue Heparin at current rate   Geannie Risen, PharmD, BCPS  12/25/2011,5:50 AM

## 2011-12-25 NOTE — Plan of Care (Signed)
Problem: Consults Goal: Cardiac Cath Patient Education (See Patient Education module for education specifics.)  Outcome: Completed/Met Date Met:  12/25/11 Patient educated regarding cardiac cath and denies any questions  Problem: Phase I Progression Outcomes Goal: Pain controlled with appropriate interventions Outcome: Completed/Met Date Met:  12/25/11 No complaints of pain, tolerating nitroglycerin gtt Goal: Voiding-avoid urinary catheter unless indicated Outcome: Completed/Met Date Met:  12/25/11 No foley catheter used, voiding in urinal Goal: Vascular site scale level 0 - I Vascular Site Scale Level 0: No bruising/bleeding/hematoma Level I (Mild): Bruising/Ecchymosis, minimal bleeding/ooozing, palpable hematoma < 3 cm Level II (Moderate): Bleeding not affecting hemodynamic parameters, pseudoaneurysm, palpable hematoma > 3 cm  Outcome: Completed/Met Date Met:  12/25/11 Vascular site Left radial level 0 Goal: Post Cath/PCI return to appropriate Path Outcome: Not Applicable Date Met:  12/25/11 At this time unsure of plan of care will reevaluate as plan emerges

## 2011-12-25 NOTE — Plan of Care (Signed)
Problem: Consults Goal: Tobacco Cessation referral if indicated Outcome: Not Applicable Date Met:  12/25/11 Patient quit in 1979

## 2011-12-25 NOTE — Progress Notes (Signed)
Subjective:  No pain overnight.  He had an episode of weakness and hypotension and his NTG was d/c  Objective:  Vital Signs in the last 24 hours: BP 121/62  Pulse 63  Temp 98.5 F (36.9 C) (Oral)  Resp 14  Ht 5\' 9"  (1.753 m)  Wt 67.132 kg (148 lb)  BMI 21.86 kg/m2  SpO2 100%  Physical Exam: Pleasant thin BM in NAD Lungs:  Clear  Cardiac:  Regular rhythm, normal S1 and S2, no S3 Extremities:  No edema present,  radial cath site is fine  Intake/Output from previous day: 06/14 0701 - 06/15 0700 In: 2003.7 [P.O.:720; I.V.:1283.7] Out: 2475 [Urine:2475]  Weight Filed Weights   12/24/11 0518 12/24/11 0717 12/25/11 0400  Weight: 68.1 kg (150 lb 2.1 oz) 92.534 kg (204 lb) 67.132 kg (148 lb)   Lab Results: Basic Metabolic Panel:  Basename 12/25/11 0416 12/24/11 0227  NA 141 144  K 3.7 2.9*  CL 107 102  CO2 25 --  GLUCOSE 133* 143*  BUN 18 17  CREATININE 1.35 1.30   CBC:  Basename 12/25/11 0416 12/24/11 0227 12/24/11 0210  WBC 6.9 -- 9.0  NEUTROABS -- -- 6.8  HGB 8.3* 10.5* --  HCT 25.1* 31.0* --  MCV 101.2* -- 101.7*  PLT 165 -- 190   Cardiac Enzymes:  Basename 12/24/11 1542 12/24/11 0635  CKTOTAL 138 146  CKMB 2.7 2.0  CKMBINDEX -- --  TROPONINI <0.30 <0.30    Telemetry: Sinus bradycardia  Assessment/Plan:  1. Unstable angina 2. Severe 3VD and bypass graft disease 3. Hypertension 4. PVD  Rec:  Awaiting surgery.  Move to telemetry.  Darden Palmer  MD [facility]  12/25/2011, 10:17 AM

## 2011-12-25 NOTE — Progress Notes (Signed)
1953 Patient complained of nausea and feeling weak, sitting on bedside commode. Blood pressure checked 88/50. Patient did not want to return to bed. Nitroglycerin gtt infusing at 15 mcg/min, discontinued and patient monitored. At bedside commode for awhile blood pressure slowly increased with slow resolving of patients symptoms. Blood pressure 120/61 at 2010. Patient feeling better. Returned to bed after bowel movement reporting feeling better, nausea resolved and feeling of weakness much better. Blood pressure 140/65. Nitroglycerin gtt restarted at 10 mcg/min. Patients blood pressure monitored closely, given metoprolol 25 mg po as ordered. Tolerating nitroglycerin gtt with blood pressures greater than 100 systolic.

## 2011-12-26 LAB — GLUCOSE, CAPILLARY
Glucose-Capillary: 112 mg/dL — ABNORMAL HIGH (ref 70–99)
Glucose-Capillary: 109 mg/dL — ABNORMAL HIGH (ref 70–99)
Glucose-Capillary: 82 mg/dL (ref 70–99)

## 2011-12-26 LAB — CBC
HCT: 23.3 % — ABNORMAL LOW (ref 39.0–52.0)
Hemoglobin: 8.1 g/dL — ABNORMAL LOW (ref 13.0–17.0)
MCHC: 34.8 g/dL (ref 30.0–36.0)
RBC: 2.35 MIL/uL — ABNORMAL LOW (ref 4.22–5.81)

## 2011-12-26 LAB — ABO/RH: ABO/RH(D): O POS

## 2011-12-26 LAB — PROTIME-INR: INR: 1.31 (ref 0.00–1.49)

## 2011-12-26 MED ORDER — POTASSIUM CHLORIDE 20 MEQ/15ML (10%) PO LIQD
40.0000 meq | Freq: Once | ORAL | Status: AC
  Administered 2011-12-26: 40 meq via ORAL
  Filled 2011-12-26 (×2): qty 30

## 2011-12-26 NOTE — Progress Notes (Signed)
Subjective:  Mild vague chest pain,  No more hypotension.  Not SOB.  Awaiting CABG.  He has been anemic too. Objective:  Vital Signs in the last 24 hours: BP 118/64  Pulse 61  Temp 98.1 F (36.7 C) (Oral)  Resp 16  Ht 5\' 9"  (1.753 m)  Wt 69.4 kg (153 lb)  BMI 22.59 kg/m2  SpO2 97%  Physical Exam: Pleasant thin BM in NAD Lungs:  Clear  Cardiac:  Regular rhythm, normal S1 and S2, no S3 Extremities:  No edema present,  radial cath site is fine  Intake/Output from previous day: 06/15 0701 - 06/16 0700 In: 1122.4 [P.O.:960; I.V.:162.4] Out: 650 [Urine:650]  Weight Filed Weights   12/24/11 0717 12/25/11 0400 12/26/11 0455  Weight: 92.534 kg (204 lb) 67.132 kg (148 lb) 69.4 kg (153 lb)   Lab Results: Basic Metabolic Panel:  Basename 12/25/11 1320 12/25/11 0416  NA 141 141  K 3.4* 3.7  CL 105 107  CO2 28 25  GLUCOSE 116* 133*  BUN 17 18  CREATININE 1.41* 1.35   CBC:  Basename 12/26/11 0450 12/25/11 0416 12/24/11 0210  WBC 5.6 6.9 --  NEUTROABS -- -- 6.8  HGB 8.1* 8.3* --  HCT 23.3* 25.1* --  MCV 99.1 101.2* --  PLT 139* 165 --   Cardiac Enzymes:  Basename 12/24/11 1542 12/24/11 0635  CKTOTAL 138 146  CKMB 2.7 2.0  CKMBINDEX -- --  TROPONINI <0.30 <0.30    Telemetry: Sinus bradycardia  Assessment/Plan:  1. Unstable angina 2. Severe 3VD and bypass graft disease 3. Hypertension 4. PVD 5. Significant anemia with 8 point drop since admit in HCt 6. Stage 3 CKD  Rec:  Guiac stools.  Transfuse today. Replete Richardo Priest  MD [facility]  12/26/2011, 12:04 PM

## 2011-12-26 NOTE — Progress Notes (Signed)
ANTICOAGULATION CONSULT NOTE  Pharmacy Consult for Heparin Indication: chest pain/ACS, s/p cath  Allergies  Allergen Reactions  . Ace Inhibitors Cough  . Ibuprofen Other (See Comments)    GI Issues    Patient Measurements: Height: 5\' 9"  (175.3 cm) Weight: 153 lb (69.4 kg) IBW/kg (Calculated) : 70.7  Heparin Dosing Weight: 72 kg   Vital Signs: Temp: 98.1 F (36.7 C) (06/16 0455) Temp src: Oral (06/16 0455) BP: 118/64 mmHg (06/16 0455) Pulse Rate: 61  (06/16 0455)  Labs:  Basename 12/26/11 0450 12/25/11 1320 12/25/11 0416 12/24/11 1542 12/24/11 0635 12/24/11 0227 12/24/11 0210  HGB 8.1* -- 8.3* -- -- -- --  HCT 23.3* -- 25.1* -- -- 31.0* --  PLT 139* -- 165 -- -- -- 190  APTT -- -- -- -- -- -- --  LABPROT 16.5* -- 18.8* -- -- -- 19.5*  INR 1.31 -- 1.54* -- -- -- 1.62*  HEPARINUNFRC 0.46 -- 0.32 -- -- -- --  CREATININE -- 1.41* 1.35 -- -- 1.30 --  CKTOTAL -- -- -- 138 146 -- --  CKMB -- -- -- 2.7 2.0 -- --  TROPONINI -- -- -- <0.30 <0.30 -- --    Estimated Creatinine Clearance: 45.1 ml/min (by C-G formula based on Cr of 1.41).  Admit Complaint: 76 y.o.  male  admitted 12/24/2011 with CAD,now waiting possible re-do CABG.  Pharmacy consulted to dose heparin.  Warfarin PTA: for h/o DVT and afib 7.5 mg MF, 5 mg TWRSS  Admission INR 1.54  Overnight Events: 12/26/2011 transferred to tele  Assessment: Anticoagulation: ACS, parox afib, hx of DVT: heparin @900  units.hr, heparin level at goal.  H/H decline 8.1/23.3, platelets 139, no bleeding noted  Infectious Disease: Afeb, WBC WNL, no issues   Cardiovascular: CAD, HTN, hyperlipidemia, PVD, parox afib, CHF (EF on cath 6/14 75%), pacer:  Amiodarone, amlodipine, ASA, atprvastatin, clonidine, diltiazem, NTG @ 10, furosemide, metoprolol, K 20 TID (K 3.4 pm 6/15, no result today): BP 118-156, HR 50-60s  Admit Weight: 150lb (68.1kg)  Current Weight: 153 lb (69.4 kg)  Endocrinology:  DM, SSI, glucose <150  Gastrointestinal /  Nutrition: GERD, protonix PO (on PTA), creon, vitamin D  Neurology/MSK: Liddle's syndrome  Nephrology/Urology/Electrolytes: BPH, flomax  Hematology / Oncology: Prostate CA, macrocytic anemia: CBC trend down  PTA Medication Issues: Home Meds Not Ordered: metformin, losartan, Lupron IM q10month, ferrous sulfate, CA  Best Practices: DVT Prophylaxis:  Full dose heparin  Goal of Therapy:  Heparin level 0.3-0.7 units/ml Monitor platelets by anticoagulation protocol: Yes   Plan:  -Continue Heparin at current rate  - Follow up heparin level, CBC and plans for OHS.  Thank you for allowing pharmacy to be a part of this patients care team.  Lovenia Kim Pharm.D., BCPS Clinical Pharmacist 12/26/2011 10:44 AM Pager: [phone] Phone: [phone]

## 2011-12-27 ENCOUNTER — Other Ambulatory Visit: Payer: Medicare Other

## 2011-12-27 ENCOUNTER — Encounter (HOSPITAL_COMMUNITY): Payer: Self-pay | Admitting: Anesthesiology

## 2011-12-27 ENCOUNTER — Inpatient Hospital Stay (HOSPITAL_COMMUNITY): Payer: Medicare Other

## 2011-12-27 ENCOUNTER — Ambulatory Visit: Payer: Medicare Other | Admitting: Surgery

## 2011-12-27 DIAGNOSIS — I2510 Atherosclerotic heart disease of native coronary artery without angina pectoris: Secondary | ICD-10-CM

## 2011-12-27 DIAGNOSIS — K551 Chronic vascular disorders of intestine: Secondary | ICD-10-CM

## 2011-12-27 LAB — CBC
HCT: 27.5 % — ABNORMAL LOW (ref 39.0–52.0)
Hemoglobin: 9.4 g/dL — ABNORMAL LOW (ref 13.0–17.0)
MCHC: 34.2 g/dL (ref 30.0–36.0)
RBC: 2.85 MIL/uL — ABNORMAL LOW (ref 4.22–5.81)
WBC: 5.4 10*3/uL (ref 4.0–10.5)

## 2011-12-27 LAB — PROTIME-INR: INR: 1.12 (ref 0.00–1.49)

## 2011-12-27 LAB — PULMONARY FUNCTION TEST

## 2011-12-27 LAB — GLUCOSE, CAPILLARY
Glucose-Capillary: 107 mg/dL — ABNORMAL HIGH (ref 70–99)
Glucose-Capillary: 116 mg/dL — ABNORMAL HIGH (ref 70–99)

## 2011-12-27 NOTE — Progress Notes (Signed)
Patient Name: [name] Date of Encounter: 12/27/2011    SUBJECTIVE:Quiet night. No chest pain. No dyspnea.  TELEMETRY:  NSR: Filed Vitals:   12/26/11 2018 12/26/11 2134 12/27/11 0421 12/27/11 1319  BP: 109/61 126/67 145/72 117/67  Pulse: 60 60 60 60  Temp: 97.5 F (36.4 C)  98.4 F (36.9 C) 98.5 F (36.9 C)  TempSrc: Oral  Oral Oral  Resp: 16  18 18   Height:      Weight:      SpO2: 98%  98% 97%    Intake/Output Summary (Last 24 hours) at 12/27/11 1339 Last data filed at 12/27/11 1300  Gross per 24 hour  Intake  707.5 ml  Output   1675 ml  Net -967.5 ml    LABS: Basic Metabolic Panel:  Basename 12/26/11 1230 12/25/11 1320 12/25/11 0416  NA -- 141 141  K -- 3.4* 3.7  CL -- 105 107  CO2 -- 28 25  GLUCOSE -- 116* 133*  BUN -- 17 18  CREATININE -- 1.41* 1.35  CALCIUM -- 8.8 8.6  MG 1.0* -- --  PHOS -- -- --   CBC:  Basename 12/27/11 0705 12/26/11 0450  WBC 5.4 5.6  NEUTROABS -- --  HGB 9.4* 8.1*  HCT 27.5* 23.3*  MCV 96.5 99.1  PLT 126* 139*   Cardiac Enzymes:  Basename 12/24/11 1542  CKTOTAL 138  CKMB 2.7  CKMBINDEX --  TROPONINI <0.30   Hemoglobin A1C:  Basename 12/25/11 0416  HGBA1C 5.9*   Fasting Lipid Panel:  Basename 12/25/11 0416  CHOL 104  HDL 56  LDLCALC 40  TRIG 41  CHOLHDL 1.9  LDLDIRECT --    Radiology/Studies:  okay  Physical Exam: Blood pressure 117/67, pulse 60, temperature 98.5 F (36.9 C), temperature source Oral, resp. rate 18, height 5\' 9"  (1.753 m), weight 69.4 kg (153 lb), SpO2 97.00%. Weight change:    1-2/6 systolic Murmur  ASSESSMENT:  1. Unstable angina, controlled on IV heparin and NTG   Plan:  1. To be seen by Dr. Laneta Simmers to get final word about possibility of surgery.  Selinda Eon 12/27/2011, 1:39 PM

## 2011-12-27 NOTE — Progress Notes (Signed)
3 Days Post-Op Procedure(s) (LRB): LEFT HEART CATHETERIZATION WITH CORONARY/GRAFT ANGIOGRAM ()  Subjective: No chest pain or SOB today  Objective: Vital signs in last 24 hours: Temp:  [97 F (36.1 C)-98.5 F (36.9 C)] 97 F (36.1 C) (06/17 2015) Pulse Rate:  [58-60] 58  (06/17 2015) Cardiac Rhythm:  [-] Atrial paced (06/17 0745) Resp:  [16-18] 16  (06/17 2015) BP: (117-145)/(67-72) 145/70 mmHg (06/17 2015) SpO2:  [96 %-98 %] 96 % (06/17 2015)  Hemodynamic parameters for last 24 hours:    Intake/Output from previous day: 06/16 0701 - 06/17 0700 In: 347.5 [Blood:347.5] Out: 600 [Urine:600] Intake/Output this shift:    General appearance: alert and cooperative Heart: regular rate and rhythm, S1, S2 normal, 1/6 systolic murmur Lungs: clear to auscultation bilaterally Abdomen: soft, non-tender; bowel sounds normal; no masses,  no organomegaly EXT: mild bilateral ankle edema  Lab Results:  [facility] 12/27/11 0705 12/26/11 0450  WBC 5.4 5.6  HGB 9.4* 8.1*  HCT 27.5* 23.3*  PLT 126* 139*   BMET:  Basename 12/25/11 1320 12/25/11 0416  NA 141 141  K 3.4* 3.7  CL 105 107  CO2 28 25  GLUCOSE 116* 133*  BUN 17 18  CREATININE 1.41* 1.35  CALCIUM 8.8 8.6    PT/INR:  Basename 12/27/11 0705  LABPROT 14.6  INR 1.12   ABG    Component Value Date/Time   PHART 7.428 12/25/2011 0629   HCO3 25.8* 12/25/2011 0629   TCO2 27.0 12/25/2011 0629   O2SAT 97.0 12/25/2011 0629   CBG (last 3)   Basename 12/27/11 1738 12/27/11 1118 12/27/11 0616  GLUCAP 162* 116* 107*    Assessment/Plan: S/P Procedure(s) (LRB): LEFT HEART CATHETERIZATION WITH CORONARY/GRAFT ANGIOGRAM ()   Dr. Orvan July consult note read, chart reviewed and cath films reviewed.  I agree that CABG is the best treatment for his coronary disease.  His hx of mesenteric ischemia really concerns me.Although he says he only has intermittent postprandial abd discomfort, his family says he has not been eating well and has  been losing weight.  He had mesenteric duplex done last week but I can't find the results tonight.  He says that Dr. [name] is planning an arteriogram tomorrow morning to evaluate his mesenteric and PVD.  I will touch base with Dr. [name] in the morning.  I think that anything that can be done to document and improve his mesenteric ischemia will be worthwhile, to minimize his risk.   LOS: 3 days    Newell Wafer K 12/27/2011

## 2011-12-27 NOTE — Procedures (Unsigned)
MESENTERIC ARTERIAL DUPLEX EVALUATION  INDICATION:  Patient is evaluated status post placement of superior mesenteric artery stent in 2011.  He continues to complain of weight loss and states that he is not eating very well.  HISTORY: Diabetes:  Yes. Cardiac:  Previous MI with CABG. Hypertension:  Yes. Smoking:  Previously.  Mesenteric Duplex Findings: Aorta - Proximal                            37  cm/s Aorta - Mid                                 42  cm/s Aorta - Distal                              39  cm/s  Celiac Trunk - Proximal                     186  cm/s Celiac Trunk - Distal  Hepatic Artery                              Patent; 65 cm/s Splenic Artery                              Patent with calcified shadowing plaque; 213  Superior Mesenteric Artery-Origin           134  cm/s Superior Mesenteric Artery-Proximal         421 cm/s, stent Superior Mesenteric Artery-Mid              303 cm/s, turbulent Superior Mesenteric Artery-Distal           137 cm/s  Inferior Mesenteric Artery-Proximal         >731  cm/s   IMPRESSION: 1. Technically difficult examination secondary to the patient's body     habitus in the presence of calcified shadowing plaque in the     visceral arteries and the aorta. 2. Patent superior mesenteric artery stent with very elevated     velocities and distal turbulence.  Calcified shadowing plaque     obscures most of the stent. 3. Probable patent celiac axis with velocities within normal limits.     Patent splenic and hepatic arteries. 4. Significant stenosis of the inferior mesenteric artery.  ___________________________________________ V. Charlena Cross, MD  CI/MEDQ  D:  12/20/2011  T:  12/20/2011  Job:  [id number]

## 2011-12-27 NOTE — Clinical Documentation Improvement (Signed)
Anemia Blood Loss Clarification  THIS DOCUMENT IS NOT A PERMANENT PART OF THE MEDICAL RECORD  RESPOND TO THE THIS QUERY, FOLLOW THE INSTRUCTIONS BELOW:  1. If needed, update documentation for the patient's encounter via the notes activity.  2. Access this query again and click edit on the In [employer].  3. After updating, or not, click F2 to complete all highlighted (required) fields concerning your review. Select "additional documentation in the medical record" OR "no additional documentation provided".  4. Click Sign note button.  5. The deficiency will fall out of your In Basket *Please let us know if you are not able to complete this workflow by phone or e-mail (listed below).        12/27/11  Dear Dr. Lacretia Nicks. [name].  / Associates  In an effort to better capture your patient's severity of illness, reflect appropriate length of stay and utilization of resources, a review of the patient medical record has revealed the following indicators.    Based on your clinical judgment, please clarify and document in a progress note and/or discharge summary the clinical condition associated with the following supporting information:  In responding to this query please exercise your independent judgment.  The fact that a query is asked, does not imply that any particular answer is desired or expected.  Possible Clinical Conditions?   " Expected Acute Blood Loss Anemia  " Acute Blood Loss Anemia " Acute on chronic blood loss anemia  " Chronic blood loss anemia  " Precipitous drop in Hematocrit  " Other Condition________________  " Cannot Clinically Determine  Risk Factors: (Per 6/16 progress note patient  had significant drop in Hct. since admission)  Supporting Information:  Signs and Symptoms ( weakness, hypotension)  Diagnostics: Component     Latest Ref Rng 12/24/2011 12/24/2011 12/24/2011 12/24/2011 12/24/2011         2:10 AM  2:25 AM  2:27 AM  6:35 AM  8:53 AM    Hemoglobin     13.0 - 17.0 g/dL 78.2 (L)  95.6 (L)    HCT     39.0 - 52.0 % 29.5 (L)  31.0 (L)     Component     Latest Ref Rng 12/24/2011 12/24/2011 12/24/2011 12/24/2011 12/24/2011                        Component     Latest Ref Rng 12/25/2011 12/25/2011 12/25/2011 12/25/2011 12/25/2011         4:16 AM  6:29 AM  7:30 AM 11:41 AM  1:20 PM  Hemoglobin     13.0 - 17.0 g/dL 8.3 (L)      HCT     21.3 - 52.0 % 25.1 (L)       Component     Latest Ref Rng 12/25/2011 12/25/2011 12/25/2011 12/26/2011 12/26/2011         1:30 PM  4:30 PM  9:28 PM  4:50 AM  6:15 AM  Hemoglobin     13.0 - 17.0 g/dL    8.1 (L)   HCT     08.6 - 52.0 %    23.3 (L)                                 Component     Latest Ref Rng 12/26/2011 12/27/2011 12/27/2011 12/27/2011         9:47 PM  6:16 AM  7:05 AM 11:18 AM  Hemoglobin     13.0 - 17.0 g/dL   9.4 (L)   HCT     08.6 - 52.0 %   27.5 (L)    Treatments: Transfusion: PRBCS on 6/16  \ Reviewed: additional documentation in the medical record  Thank You,  Shelda Pal RN, BSN, CCM   Clinical Documentation Specialist:  Pager [phone]  Health Information Management ,[email or web address]

## 2011-12-27 NOTE — Progress Notes (Signed)
ANTICOAGULATION CONSULT NOTE  Pharmacy Consult for Heparin Indication: chest pain/ACS, awaiting redo CABG  Allergies  Allergen Reactions  . Ace Inhibitors Cough  . Ibuprofen Other (See Comments)    GI Issues    Labs:  Basename 12/27/11 0705 12/26/11 0450 12/25/11 1320 12/25/11 0416 12/24/11 1542  HGB 9.4* 8.1* -- -- --  HCT 27.5* 23.3* -- 25.1* --  PLT 126* 139* -- 165 --  APTT -- -- -- -- --  LABPROT 14.6 16.5* -- 18.8* --  INR 1.12 1.31 -- 1.54* --  HEPARINUNFRC 0.66 0.46 -- 0.32 --  CREATININE -- -- 1.41* 1.35 --  CKTOTAL -- -- -- -- 138  CKMB -- -- -- -- 2.7  TROPONINI -- -- -- -- <0.30    Estimated Creatinine Clearance: 45.1 ml/min (by C-G formula based on Cr of 1.41).  Assessment: 78 to male with CAD s/p cath, awaiting possible re-do CABG, for heparin.   Goal of Therapy:  Heparin level 0.3-0.7 units/ml Monitor platelets by anticoagulation protocol: Yes   Plan:  -Continue Heparin at 900 units/hr -Follow up AM  Okey Regal, PharmD 12/27/2011,9:22 AM

## 2011-12-27 NOTE — Consult Note (Signed)
[facility] [place]      Consult Note  Patient name: [name] MRN: [id number] DOB: [birth year] Sex: male  Consulting Physician:  Dr. [name]  Reason for Consult: No chief complaint on file.   HISTORY OF PRESENT ILLNESS: The patient is s/p celiac and SMA stenting in July of 2011 for chronic mesenteric ischemia.  He was recently seen in the office with complaints of postprandial abdominal pain and weight loss.  Duplex revealed elevated velocities within his SMA stent.  He underwent angiogram in November which revealed a widely patent SMA stent and 40% stenosis of his celiac artery stent.  He was admitted with unstable angina and is currently being evaluated for redo CABG.  Past Medical History  Diagnosis Date  . Diabetes mellitus   . CAD (coronary artery disease)     s/b CABG '94  . Hypertension   . Anemia     Macrocytic  . Peripheral vascular disease   . Hyperlipidemia   . Cancer     prostate  . Ulcer   . GERD (gastroesophageal reflux disease)   . Liddle's syndrome   . Hemorrhoids   . Intestinal ischemia   . DVT (deep venous thrombosis)   . Atrial fibrillation     Paroxysmal. S/p PPM  . CHF (congestive heart failure)     Past Surgical History  Procedure Date  . Hiatal hernia repair     and ulcer repair  . Appendectomy   . Pacemaker insertion   . Coronary artery bypass graft 01/22/1993  . Cholecystectomy Oct 2009    Gall Bladder    History   Social History  . Marital Status: Married    Spouse Name: N/A    Number of Children: N/A  . Years of Education: N/A   Occupational History  . Not on file.   Social History Main Topics  . Smoking status: Former Smoker    Types: Cigarettes    Quit date: 07/12/1977  . Smokeless tobacco: Not on file  . Alcohol Use: No  . Drug Use: No  . Sexually Active:    Other Topics Concern  . Not on file   Social History Narrative  . No narrative on file    Family History  Problem Relation Age of  Onset  . Diabetes Mother   . Heart disease Mother     Heart Disease before age 59  . Hyperlipidemia Mother   . Hypertension Mother   . Cancer Father   . Cancer Sister   . Diabetes Sister   . Heart disease Daughter     Heart Disease before age 25  . Hypertension Daughter   . Heart attack Daughter     Allergies as of 12/24/2011 - Review Complete 12/24/2011  Allergen Reaction Noted  . Ace inhibitors Cough 05/12/2011  . Ibuprofen Other (See Comments) 05/12/2011    Current Facility-Administered Medications on File Prior to Encounter  Medication Dose Route Frequency Provider Last Rate Last Dose  . acetaminophen (TYLENOL) tablet 650 mg  650 mg Oral Q4H PRN Dolores Patty, MD      . amiodarone (PACERONE) tablet 100 mg  100 mg Oral Daily Dolores Patty, MD   100 mg at 12/27/11 1026  . amLODipine (NORVASC) tablet 5 mg  5 mg Oral Daily Dolores Patty, MD   5 mg at 12/27/11 1026  . aspirin EC tablet 81 mg  81 mg Oral Daily Dolores Patty, MD   81 mg  at 12/27/11 1026  . atorvastatin (LIPITOR) tablet 80 mg  80 mg Oral Daily Dolores Patty, MD   80 mg at 12/27/11 1026  . cholecalciferol (VITAMIN D) tablet 1,000 Units  1,000 Units Oral Daily Dolores Patty, MD   1,000 Units at 12/27/11 1026  . cloNIDine (CATAPRES) tablet 0.2 mg  0.2 mg Oral BID Dolores Patty, MD   0.2 mg at 12/27/11 2138  . diltiazem (CARDIZEM CD) 24 hr capsule 360 mg  360 mg Oral Daily Dolores Patty, MD   360 mg at 12/27/11 1026  . furosemide (LASIX) tablet 40 mg  40 mg Oral Daily Dolores Patty, MD   40 mg at 12/27/11 1026  . heparin ADULT infusion 100 units/mL (25000 units/250 mL)  900 Units/hr Intravenous Continuous Lyn Records III, MD 9 mL/hr at 12/27/11 0516 900 Units/hr at 12/27/11 0516  . insulin aspart (novoLOG) injection 0-9 Units  0-9 Units Subcutaneous TID WC Dolores Patty, MD   2 Units at 12/27/11 1812  . lipase/protease/amylase (CREON-10/PANCREASE) capsule 1 capsule  1  capsule Oral TID AC Dolores Patty, MD   1 capsule at 12/27/11 1650  . metoprolol tartrate (LOPRESSOR) tablet 25 mg  25 mg Oral BID Dolores Patty, MD   25 mg at 12/27/11 2137  . nitroGLYCERIN (NITROSTAT) SL tablet 0.4 mg  0.4 mg Sublingual Q5 min PRN Dolores Patty, MD      . nitroGLYCERIN 0.2 mg/mL in dextrose 5 % infusion  3-30 mcg/min Intravenous Titrated Dolores Patty, MD 0.9 mL/hr at 12/26/11 2135 3 mcg/min at 12/26/11 2135  . pantoprazole (PROTONIX) EC tablet 40 mg  40 mg Oral Q1200 Dolores Patty, MD   40 mg at 12/27/11 1221  . potassium chloride SA (K-DUR,KLOR-CON) CR tablet 20 mEq  20 mEq Oral TID Lyn Records III, MD   20 mEq at 12/27/11 2138  . Tamsulosin HCl (FLOMAX) capsule 0.4 mg  0.4 mg Oral Daily Dolores Patty, MD   0.4 mg at 12/27/11 1025   Current Outpatient Prescriptions on File Prior to Encounter  Medication Sig Dispense Refill  . acetaminophen (ARTHRITIS PAIN RELIEF) 650 MG CR tablet Take 1,300 mg by mouth 2 (two) times daily.       Marland Kitchen amiodarone (PACERONE) 200 MG tablet Take 100 mg by mouth daily.       Marland Kitchen amLODipine (NORVASC) 5 MG tablet Take 5 mg by mouth daily.       Marland Kitchen aspirin EC 81 MG tablet Take 81 mg by mouth daily.       Marland Kitchen atorvastatin (LIPITOR) 80 MG tablet Take 80 mg by mouth daily.      Marland Kitchen CALCIUM PO Take 2,000 mg by mouth daily.       . cholecalciferol (VITAMIN D) 1000 UNITS tablet Take 1,000 Units by mouth daily.       . cloNIDine (CATAPRES) 0.2 MG tablet Take 0.2 mg by mouth 2 (two) times daily.       Marland Kitchen diltiazem (CARDIZEM CD) 180 MG 24 hr capsule Take 360 mg by mouth daily.       . ferrous sulfate 325 (65 FE) MG tablet Take 325 mg by mouth 2 (two) times daily.      . furosemide (LASIX) 80 MG tablet Take 40 mg by mouth daily.       Marland Kitchen Leuprolide Acetate (LUPRON DEPOT IM) Inject into the muscle See admin instructions. Every 4 months as needed      .  losartan (COZAAR) 100 MG tablet Take 100 mg by mouth daily.       . metFORMIN  (GLUCOPHAGE) 500 MG tablet Take 1,000 mg by mouth 2 (two) times daily with a meal.       . nitroGLYCERIN (NITROSTAT) 0.4 MG SL tablet Place 0.4 mg under the tongue every 5 (five) minutes as needed. For chest pain      . omeprazole (PRILOSEC) 20 MG capsule Take 20 mg by mouth daily.       . Pancrelipase, Lip-Prot-Amyl, 5000 UNITS CPEP Take 2 capsules by mouth 3 (three) times daily.       . potassium chloride (KLOR-CON) 20 MEQ packet Take 20 mEq by mouth 3 (three) times daily.       . silodosin (RAPAFLO) 8 MG CAPS capsule Take 8 mg by mouth daily with breakfast.      . warfarin (COUMADIN) 5 MG tablet Take 2.5-5 mg by mouth daily. Take 2.5mg  on Saturday, take 5mg  all other days         REVIEW OF SYSTEMS: Please see H&P  PHYSICAL EXAMINATION: General: The patient appears their stated age.  Vital signs are There were no vitals taken for this visit. Pulmonary: Respirations are non-labored HEENT:  No gross abnormalities Abdomen: Soft and non-tender  Musculoskeletal: There are no major deformities.   Neurologic: No focal weakness or paresthesias are detected, Skin: There are no ulcer or rashes noted. Psychiatric: The patient has normal affect. Cardiovascular: There is a regular rate and rhythm without significant murmur appreciated.  Diagnostic Studies:  VV Mesenteric Arterial Duplex Eval by Provider Default, MD on 12/20/2011 5:58 PM     MESENTERIC ARTERIAL DUPLEX EVALUATION  INDICATION: Patient is evaluated status post placement of superior  mesenteric artery stent in 2011. He continues to complain of weight  loss and states that he is not eating very well.  HISTORY:  Diabetes: Yes.  Cardiac: Previous MI with CABG.  Hypertension: Yes.  Smoking: Previously.  Mesenteric Duplex Findings:  Aorta - Proximal 37 cm/s  Aorta - Mid 42 cm/s  Aorta - Distal 39 cm/s  Celiac Trunk - Proximal 186 cm/s  Celiac Trunk - Distal  Hepatic Artery Patent; 65 cm/s  Splenic Artery Patent with calcified    shadowing plaque; 213  Superior Mesenteric Artery-Origin 134 cm/s  Superior Mesenteric Artery-Proximal 421 cm/s, stent  Superior Mesenteric Artery-Mid 303 cm/s, turbulent  Superior Mesenteric Artery-Distal 137 cm/s  Inferior Mesenteric Artery-Proximal >731 cm/s  IMPRESSION:  1. Technically difficult examination secondary to the patient's body  habitus in the presence of calcified shadowing plaque in the  visceral arteries and the aorta.  2. Patent superior mesenteric artery stent with very elevated  velocities and distal turbulence. Calcified shadowing plaque  obscures most of the stent.  3. Probable patent celiac axis with velocities within normal limits.  Patent splenic and hepatic arteries.  4. Significant stenosis of the inferior mesenteric artery.      Assessment:  Possible SMA stent stenosis and abdominal symptoms Plan: I have discussed his case with Dr. [name] and will need to discuss it with Dr. Laneta Simmers.  The patient is scheduled for mesenteric angiogram tomorrow, which I will plan on proceeding with unless Dr. Laneta Simmers objects.     Jorge Ny, M.D. [facility] [place] Office: [phone] Pager:  [phone]

## 2011-12-28 ENCOUNTER — Encounter (HOSPITAL_COMMUNITY)
Admission: EM | Disposition: A | Discharge status: Home / Self Care | Payer: Self-pay | Source: Ambulatory Visit | Attending: Interventional Cardiology

## 2011-12-28 ENCOUNTER — Encounter (HOSPITAL_COMMUNITY): Payer: Self-pay

## 2011-12-28 ENCOUNTER — Telehealth: Payer: Self-pay | Admitting: Surgery

## 2011-12-28 ENCOUNTER — Ambulatory Visit (HOSPITAL_COMMUNITY): Admit: 2011-12-28 | Payer: Medicare Other | Admitting: Surgery

## 2011-12-28 ENCOUNTER — Ambulatory Visit: Admit: 2011-12-28 | Payer: Self-pay | Admitting: Surgery

## 2011-12-28 ENCOUNTER — Encounter: Payer: Self-pay | Admitting: Surgery

## 2011-12-28 HISTORY — PX: VISCERAL ANGIOGRAM: SHX5515

## 2011-12-28 LAB — CARDIAC PANEL(CRET KIN+CKTOT+MB+TROPI)
CK, MB: 1.6 ng/mL (ref 0.3–4.0)
Relative Index: INVALID (ref 0.0–2.5)
Total CK: 64 U/L (ref 7–232)
Total CK: 69 U/L (ref 7–232)

## 2011-12-28 LAB — GLUCOSE, CAPILLARY
Glucose-Capillary: 102 mg/dL — ABNORMAL HIGH (ref 70–99)
Glucose-Capillary: 110 mg/dL — ABNORMAL HIGH (ref 70–99)
Glucose-Capillary: 161 mg/dL — ABNORMAL HIGH (ref 70–99)

## 2011-12-28 LAB — CBC
HCT: 25 % — ABNORMAL LOW (ref 39.0–52.0)
MCH: 33.3 pg (ref 26.0–34.0)
MCHC: 34 g/dL (ref 30.0–36.0)
MCV: 98 fL (ref 78.0–100.0)
RDW: 15.3 % (ref 11.5–15.5)

## 2011-12-28 SURGERY — ABDOMINAL AORTAGRAM
Anesthesia: LOCAL

## 2011-12-28 SURGERY — Surgical Case
Anesthesia: *Unknown

## 2011-12-28 SURGERY — VISCERAL ANGIOGRAM
Anesthesia: Moderate Sedation

## 2011-12-28 SURGERY — VISCERAL ANGIOGRAM
Anesthesia: LOCAL | Laterality: Bilateral

## 2011-12-28 MED ORDER — ALUM & MAG HYDROXIDE-SIMETH 200-200-20 MG/5ML PO SUSP
15.0000 mL | ORAL | Status: DC | PRN
  Administered 2011-12-29: 30 mL via ORAL
  Filled 2011-12-28: qty 30

## 2011-12-28 MED ORDER — OXYCODONE-ACETAMINOPHEN 5-325 MG PO TABS
1.0000 | ORAL_TABLET | ORAL | Status: DC | PRN
  Administered 2011-12-31 – 2012-01-01 (×2): 2 via ORAL
  Filled 2011-12-28 (×2): qty 2

## 2011-12-28 MED ORDER — MORPHINE SULFATE 2 MG/ML IJ SOLN: 2.0000 mg | INTRAMUSCULAR | Status: DC | PRN

## 2011-12-28 MED ORDER — MIDAZOLAM HCL 2 MG/2ML IJ SOLN
INTRAMUSCULAR | Status: AC
  Filled 2011-12-28: qty 2

## 2011-12-28 MED ORDER — ONDANSETRON HCL 4 MG/2ML IJ SOLN: 4.0000 mg | Freq: Four times a day (QID) | INTRAMUSCULAR | Status: DC | PRN

## 2011-12-28 MED ORDER — METOPROLOL TARTRATE 1 MG/ML IV SOLN
INTRAVENOUS | Status: AC
  Filled 2011-12-28: qty 5

## 2011-12-28 MED ORDER — HYDRALAZINE HCL 20 MG/ML IJ SOLN
10.0000 mg | INTRAMUSCULAR | Status: DC | PRN
  Administered 2011-12-28: 10 mg via INTRAVENOUS
  Filled 2011-12-28: qty 0.5

## 2011-12-28 MED ORDER — METOPROLOL TARTRATE 1 MG/ML IV SOLN
2.0000 mg | INTRAVENOUS | Status: DC | PRN
  Administered 2011-12-28: 2 mg via INTRAVENOUS

## 2011-12-28 MED ORDER — PHENOL 1.4 % MT LIQD
1.0000 | OROMUCOSAL | Status: DC | PRN
  Filled 2011-12-28: qty 177

## 2011-12-28 MED ORDER — GUAIFENESIN-DM 100-10 MG/5ML PO SYRP: 15.0000 mL | ORAL_SOLUTION | ORAL | Status: DC | PRN

## 2011-12-28 MED ORDER — HYDRALAZINE HCL 20 MG/ML IJ SOLN
INTRAMUSCULAR | Status: AC
  Filled 2011-12-28: qty 1

## 2011-12-28 MED ORDER — LIDOCAINE HCL (PF) 1 % IJ SOLN
INTRAMUSCULAR | Status: AC
  Filled 2011-12-28: qty 30

## 2011-12-28 MED ORDER — HEPARIN (PORCINE) IN NACL 2-0.9 UNIT/ML-% IJ SOLN
INTRAMUSCULAR | Status: AC
  Filled 2011-12-28: qty 1000

## 2011-12-28 MED ORDER — ACETAMINOPHEN 650 MG RE SUPP: 325.0000 mg | RECTAL | Status: DC | PRN

## 2011-12-28 MED ORDER — CLONIDINE HCL 0.2 MG PO TABS
0.2000 mg | ORAL_TABLET | ORAL | Status: DC | PRN
  Administered 2012-01-01: 0.2 mg via ORAL
  Filled 2011-12-28: qty 1

## 2011-12-28 MED ORDER — FENTANYL CITRATE 0.05 MG/ML IJ SOLN
INTRAMUSCULAR | Status: AC
  Filled 2011-12-28: qty 2

## 2011-12-28 MED ORDER — ACETAMINOPHEN 325 MG PO TABS
325.0000 mg | ORAL_TABLET | ORAL | Status: DC | PRN
  Administered 2011-12-28: 650 mg via ORAL

## 2011-12-28 MED ORDER — SODIUM CHLORIDE 0.9 % IV SOLN
INTRAVENOUS | Status: DC
  Administered 2011-12-29: 20 mL/h via INTRAVENOUS
  Administered 2011-12-30: 20:00:00 via INTRAVENOUS
  Administered 2011-12-31: 20 mL/h via INTRAVENOUS

## 2011-12-28 NOTE — Progress Notes (Signed)
Patient Name: [name] Date of Encounter: 12/28/2011    SUBJECTIVE: No angina over night. Dr. Sharee Pimple note appreciated.  TELEMETRY:  NSR: Filed Vitals:   12/27/11 0421 12/27/11 1319 12/27/11 2015 12/28/11 0501  BP: 145/72 117/67 145/70 102/61  Pulse: 60 60 58 69  Temp: 98.4 F (36.9 C) 98.5 F (36.9 C) 97 F (36.1 C) 98.2 F (36.8 C)  TempSrc: Oral Oral Oral Oral  Resp: 18 18 16 20   Height:      Weight:      SpO2: 98% 97% 96% 97%    Intake/Output Summary (Last 24 hours) at 12/28/11 0747 Last data filed at 12/28/11 0042  Gross per 24 hour  Intake    360 ml  Output   2850 ml  Net  -2490 ml    LABS: Basic Metabolic Panel:  Basename 12/26/11 1230 12/25/11 1320  NA -- 141  K -- 3.4*  CL -- 105  CO2 -- 28  GLUCOSE -- 116*  BUN -- 17  CREATININE -- 1.41*  CALCIUM -- 8.8  MG 1.0* --  PHOS -- --   CBC:  Basename 12/28/11 0525 12/27/11 0705  WBC 6.2 5.4  NEUTROABS -- --  HGB 8.5* 9.4*  HCT 25.0* 27.5*  MCV 98.0 96.5  PLT 130* 126*   Physical Exam: Blood pressure 102/61, pulse 69, temperature 98.2 F (36.8 C), temperature source Oral, resp. rate 20, height 5\' 9"  (1.753 m), weight 69.4 kg (153 lb), SpO2 97.00%. Weight change:    S4 gallop.  ASSESSMENT: 1. CAD with unstable angina 2. Mesenteric ischemia with history of prior PCI 3. PVD   Plan:  1. As per Dr. Laneta Simmers 2. Dr. [name] to assess for possible intestinal ischemia. He and I spoke yesterday and he will decide after discussing with Dr. Laneta Simmers.  Selinda Eon 12/28/2011, 7:47 AM

## 2011-12-28 NOTE — Telephone Encounter (Signed)
Patients voicemail is full and could not receive any new messages. Sent appt letter and hand wrote "Please do not eat, drink, smoke or chew gum eight hours before this exam."

## 2011-12-28 NOTE — Progress Notes (Signed)
Frequent vitals showing intermittant elevated SBP (144-176); Pt did not receive any pertnient daily meds until now: metoprolol, clonidine, amiodarone, norvasc.  Will report thoroughly to night RN.

## 2011-12-28 NOTE — Op Note (Signed)
[facility] [place]  Patient name: [name] MRN: [id number] DOB: 06-Nov-1935 Sex: male  12/24/2011 - 12/28/2011 Pre-operative Diagnosis: Rule out mesenteric stenosis Post-operative diagnosis:  Same Surgeon:  Jorge Ny Procedure Performed:  1.  ultrasound access left femoral artery  2.  abdominal aortogram  3.  first order catheterization   4.  celiac artery angiogram  5.  first order catheterization (superior mesenteric artery)  6.  superior mesenteric artery angiogram    Indications:  The patient has a history of mesenteric stenosis and has undergone superior mesenteric artery and celiac artery stenting in the past. He was seen in the office recently and found to have vague abdominal complaints and an ultrasound which revealed elevated velocities within his superior mesenteric artery. He has also been diagnosed with unstable angina and is awaiting redo coronary artery bypass grafting. He comes in today for further evaluation of his mesenteric issues.  Procedure:  The patient was identified in the holding area and taken to room 8.  The patient was then placed supine on the table and prepped and draped in the usual sterile fashion.  A time out was called.  Ultrasound was used to evaluate the left common femoral artery.  It was patent .  A digital ultrasound image was acquired.  The left common femoral artery was accessed under ultrasound guidance using an 18-gauge needle. A Benson wire was advanced into the aorta under fluoroscopic visualization and a 5 French sheath was placed.  An omniflush catheter was advanced over the wire to the level of L-1.  An abdominal angiogram was obtained. Next the celiac artery and superior mesenteric artery were individually cannulated with a JR 4 catheter and respective angiograms were performed of the celiac and superior mesenteric artery. I obtained additional imaging of the superior mesenteric artery using a Cobra  catheter.  Findings:   Aortogram:  The supraceliac aorta does not show any significant stenotic lesions. The infrarenal abdominal aorta is heavily calcified but patent without significant stenosis. The iliac arterial system is without significant stenosis although it is very calcified. A large inferior mesenteric artery is identified which opacifies the marginal artery.  Celiac artery:  The stent within the celiac artery is patent however at its distal extent extending into the splenic artery there is a focal stenosis. The stenosis goes across from the origin of the left gastric artery. The hepatic artery appears to fill from superior mesenteric or inferior mesenteric artery collaterals.  Superior mesenteric artery: The stent within the superior mesenteric artery is widely patent. The artery itself was heavily calcified. I did not see any significant lesions within the superior mesenteric artery.  Intervention:  None  Impression:  #1  a significant stenosis is seen in the distal portion of the celiac artery extending into the distal celiac artery. The location of the stenosis is not ideal for percutaneous intervention as it involves the origin of the left gastric artery. In addition based on the patient's other mesenteric vessel evaluation I do not feel that this lesion is contributing towards abdominal symptoms. He is well collateralized from the marginal artery of [place] and superior mesenteric artery. In addition he has a large and widely patent inferior mesenteric artery.   Juleen China, M.D. [facility] [place] Office: [phone] Pager:  [phone]

## 2011-12-28 NOTE — Interval H&P Note (Signed)
History and Physical Interval Note:  12/28/2011 1:49 PM  [name]  has presented today for surgery, with the diagnosis of c  The various methods of treatment have been discussed with the patient and family. After consideration of risks, benefits and other options for treatment, the patient has consented to  Procedure(s) (LRB): VISCERAL ANGIOGRAM (Bilateral) as a surgical intervention .  The patient's history has been reviewed, patient examined, no change in status, stable for surgery.  I have reviewed the patients' chart and labs.  Questions were answered to the patient's satisfaction.     Annise Boran IV, V. WELLS

## 2011-12-28 NOTE — H&P (View-Only) (Signed)
[facility] [place]      Consult Note  Patient name: [name] MRN: [id number] DOB: 12/29/1935 Sex: male  Consulting Physician:  Dr. Smith  Reason for Consult: No chief complaint on file.   HISTORY OF PRESENT ILLNESS: The patient is s/p celiac and SMA stenting in July of 2011 for chronic mesenteric ischemia.  He was recently seen in the office with complaints of postprandial abdominal pain and weight loss.  Duplex revealed elevated velocities within his SMA stent.  He underwent angiogram in November which revealed a widely patent SMA stent and 40% stenosis of his celiac artery stent.  He was admitted with unstable angina and is currently being evaluated for redo CABG.  Past Medical History  Diagnosis Date  . Diabetes mellitus   . CAD (coronary artery disease)     s/b CABG '94  . Hypertension   . Anemia     Macrocytic  . Peripheral vascular disease   . Hyperlipidemia   . Cancer     prostate  . Ulcer   . GERD (gastroesophageal reflux disease)   . Liddle's syndrome   . Hemorrhoids   . Intestinal ischemia   . DVT (deep venous thrombosis)   . Atrial fibrillation     Paroxysmal. S/p PPM  . CHF (congestive heart failure)     Past Surgical History  Procedure Date  . Hiatal hernia repair     and ulcer repair  . Appendectomy   . Pacemaker insertion   . Coronary artery bypass graft 01/22/1993  . Cholecystectomy Oct 2009    Gall Bladder    History   Social History  . Marital Status: Married    Spouse Name: N/A    Number of Children: N/A  . Years of Education: N/A   Occupational History  . Not on file.   Social History Main Topics  . Smoking status: Former Smoker    Types: Cigarettes    Quit date: 07/12/1977  . Smokeless tobacco: Not on file  . Alcohol Use: No  . Drug Use: No  . Sexually Active:    Other Topics Concern  . Not on file   Social History Narrative  . No narrative on file    Family History  Problem Relation Age of  Onset  . Diabetes Mother   . Heart disease Mother     Heart Disease before age 60  . Hyperlipidemia Mother   . Hypertension Mother   . Cancer Father   . Cancer Sister   . Diabetes Sister   . Heart disease Daughter     Heart Disease before age 60  . Hypertension Daughter   . Heart attack Daughter     Allergies as of 12/24/2011 - Review Complete 12/24/2011  Allergen Reaction Noted  . Ace inhibitors Cough 05/12/2011  . Ibuprofen Other (See Comments) 05/12/2011    Current Facility-Administered Medications on File Prior to Encounter  Medication Dose Route Frequency Provider Last Rate Last Dose  . acetaminophen (TYLENOL) tablet 650 mg  650 mg Oral Q4H PRN Daniel R Bensimhon, MD      . amiodarone (PACERONE) tablet 100 mg  100 mg Oral Daily Daniel R Bensimhon, MD   100 mg at 12/27/11 1026  . amLODipine (NORVASC) tablet 5 mg  5 mg Oral Daily Daniel R Bensimhon, MD   5 mg at 12/27/11 1026  . aspirin EC tablet 81 mg  81 mg Oral Daily Daniel R Bensimhon, MD   81 mg   at 12/27/11 1026  . atorvastatin (LIPITOR) tablet 80 mg  80 mg Oral Daily Daniel R Bensimhon, MD   80 mg at 12/27/11 1026  . cholecalciferol (VITAMIN D) tablet 1,000 Units  1,000 Units Oral Daily Daniel R Bensimhon, MD   1,000 Units at 12/27/11 1026  . cloNIDine (CATAPRES) tablet 0.2 mg  0.2 mg Oral BID Daniel R Bensimhon, MD   0.2 mg at 12/27/11 2138  . diltiazem (CARDIZEM CD) 24 hr capsule 360 mg  360 mg Oral Daily Daniel R Bensimhon, MD   360 mg at 12/27/11 1026  . furosemide (LASIX) tablet 40 mg  40 mg Oral Daily Daniel R Bensimhon, MD   40 mg at 12/27/11 1026  . heparin ADULT infusion 100 units/mL (25000 units/250 mL)  900 Units/hr Intravenous Continuous Henry W Smith III, MD 9 mL/hr at 12/27/11 0516 900 Units/hr at 12/27/11 0516  . insulin aspart (novoLOG) injection 0-9 Units  0-9 Units Subcutaneous TID WC Daniel R Bensimhon, MD   2 Units at 12/27/11 1812  . lipase/protease/amylase (CREON-10/PANCREASE) capsule 1 capsule  1  capsule Oral TID AC Daniel R Bensimhon, MD   1 capsule at 12/27/11 1650  . metoprolol tartrate (LOPRESSOR) tablet 25 mg  25 mg Oral BID Daniel R Bensimhon, MD   25 mg at 12/27/11 2137  . nitroGLYCERIN (NITROSTAT) SL tablet 0.4 mg  0.4 mg Sublingual Q5 min PRN Daniel R Bensimhon, MD      . nitroGLYCERIN 0.2 mg/mL in dextrose 5 % infusion  3-30 mcg/min Intravenous Titrated Daniel R Bensimhon, MD 0.9 mL/hr at 12/26/11 2135 3 mcg/min at 12/26/11 2135  . pantoprazole (PROTONIX) EC tablet 40 mg  40 mg Oral Q1200 Daniel R Bensimhon, MD   40 mg at 12/27/11 1221  . potassium chloride SA (K-DUR,KLOR-CON) CR tablet 20 mEq  20 mEq Oral TID Henry W Smith III, MD   20 mEq at 12/27/11 2138  . Tamsulosin HCl (FLOMAX) capsule 0.4 mg  0.4 mg Oral Daily Daniel R Bensimhon, MD   0.4 mg at 12/27/11 1025   Current Outpatient Prescriptions on File Prior to Encounter  Medication Sig Dispense Refill  . acetaminophen (ARTHRITIS PAIN RELIEF) 650 MG CR tablet Take 1,300 mg by mouth 2 (two) times daily.       . amiodarone (PACERONE) 200 MG tablet Take 100 mg by mouth daily.       . amLODipine (NORVASC) 5 MG tablet Take 5 mg by mouth daily.       . aspirin EC 81 MG tablet Take 81 mg by mouth daily.       . atorvastatin (LIPITOR) 80 MG tablet Take 80 mg by mouth daily.      . CALCIUM PO Take 2,000 mg by mouth daily.       . cholecalciferol (VITAMIN D) 1000 UNITS tablet Take 1,000 Units by mouth daily.       . cloNIDine (CATAPRES) 0.2 MG tablet Take 0.2 mg by mouth 2 (two) times daily.       . diltiazem (CARDIZEM CD) 180 MG 24 hr capsule Take 360 mg by mouth daily.       . ferrous sulfate 325 (65 FE) MG tablet Take 325 mg by mouth 2 (two) times daily.      . furosemide (LASIX) 80 MG tablet Take 40 mg by mouth daily.       . Leuprolide Acetate (LUPRON DEPOT IM) Inject into the muscle See admin instructions. Every 4 months as needed      .   losartan (COZAAR) 100 MG tablet Take 100 mg by mouth daily.       . metFORMIN  (GLUCOPHAGE) 500 MG tablet Take 1,000 mg by mouth 2 (two) times daily with a meal.       . nitroGLYCERIN (NITROSTAT) 0.4 MG SL tablet Place 0.4 mg under the tongue every 5 (five) minutes as needed. For chest pain      . omeprazole (PRILOSEC) 20 MG capsule Take 20 mg by mouth daily.       . Pancrelipase, Lip-Prot-Amyl, 5000 UNITS CPEP Take 2 capsules by mouth 3 (three) times daily.       . potassium chloride (KLOR-CON) 20 MEQ packet Take 20 mEq by mouth 3 (three) times daily.       . silodosin (RAPAFLO) 8 MG CAPS capsule Take 8 mg by mouth daily with breakfast.      . warfarin (COUMADIN) 5 MG tablet Take 2.5-5 mg by mouth daily. Take 2.5mg on Saturday, take 5mg all other days         REVIEW OF SYSTEMS: Please see H&P  PHYSICAL EXAMINATION: General: The patient appears their stated age.  Vital signs are There were no vitals taken for this visit. Pulmonary: Respirations are non-labored HEENT:  No gross abnormalities Abdomen: Soft and non-tender  Musculoskeletal: There are no major deformities.   Neurologic: No focal weakness or paresthesias are detected, Skin: There are no ulcer or rashes noted. Psychiatric: The patient has normal affect. Cardiovascular: There is a regular rate and rhythm without significant murmur appreciated.  Diagnostic Studies:  VV Mesenteric Arterial Duplex Eval by Provider Default, MD on 12/20/2011 5:58 PM     MESENTERIC ARTERIAL DUPLEX EVALUATION  INDICATION: Patient is evaluated status post placement of superior  mesenteric artery stent in 2011. He continues to complain of weight  loss and states that he is not eating very well.  HISTORY:  Diabetes: Yes.  Cardiac: Previous MI with CABG.  Hypertension: Yes.  Smoking: Previously.  Mesenteric Duplex Findings:  Aorta - Proximal 37 cm/s  Aorta - Mid 42 cm/s  Aorta - Distal 39 cm/s  Celiac Trunk - Proximal 186 cm/s  Celiac Trunk - Distal  Hepatic Artery Patent; 65 cm/s  Splenic Artery Patent with calcified    shadowing plaque; 213  Superior Mesenteric Artery-Origin 134 cm/s  Superior Mesenteric Artery-Proximal 421 cm/s, stent  Superior Mesenteric Artery-Mid 303 cm/s, turbulent  Superior Mesenteric Artery-Distal 137 cm/s  Inferior Mesenteric Artery-Proximal >731 cm/s  IMPRESSION:  1. Technically difficult examination secondary to the patient's body  habitus in the presence of calcified shadowing plaque in the  visceral arteries and the aorta.  2. Patent superior mesenteric artery stent with very elevated  velocities and distal turbulence. Calcified shadowing plaque  obscures most of the stent.  3. Probable patent celiac axis with velocities within normal limits.  Patent splenic and hepatic arteries.  4. Significant stenosis of the inferior mesenteric artery.      Assessment:  Possible SMA stent stenosis and abdominal symptoms Plan: I have discussed his case with Dr. Smith and will need to discuss it with Dr. Bartle.  The patient is scheduled for mesenteric angiogram tomorrow, which I will plan on proceeding with unless Dr. Bartle objects.     V. Wells Aamya Orellana IV, M.D. [facility] [place] Office: [phone] Pager:  [phone] 

## 2011-12-28 NOTE — Progress Notes (Signed)
ANTICOAGULATION CONSULT NOTE  Pharmacy Consult for Heparin Indication: chest pain/ACS, awaiting redo CABG  Allergies  Allergen Reactions  . Ace Inhibitors Cough  . Ibuprofen Other (See Comments)    GI Issues    Labs:  Basename 12/28/11 0525 12/27/11 0705 12/26/11 0450 12/25/11 1320  HGB 8.5* 9.4* -- --  HCT 25.0* 27.5* 23.3* --  PLT 130* 126* 139* --  APTT -- -- -- --  LABPROT 14.4 14.6 16.5* --  INR 1.10 1.12 1.31 --  HEPARINUNFRC 0.38 0.66 0.46 --  CREATININE -- -- -- 1.41*  CKTOTAL -- -- -- --  CKMB -- -- -- --  TROPONINI -- -- -- --    Estimated Creatinine Clearance: 45.1 ml/min (by C-G formula based on Cr of 1.41).  Assessment: 76 to male with CAD s/p cath, awaiting possible re-do CABG, for heparin.   Mesenteric ischemia under evaluation  Heparin level therapeutic  Goal of Therapy:  Heparin level 0.3-0.7 units/ml Monitor platelets by anticoagulation protocol: Yes   Plan:  -Continue Heparin at 900 units/hr -Follow up AM  Okey Regal, PharmD 12/28/2011,9:08 AM

## 2011-12-28 NOTE — Telephone Encounter (Signed)
Message copied by Fredrich Birks on Tue Dec 28, 2011  3:16 PM ------      Message from: Melene Plan      Created: Tue Dec 28, 2011  3:03 PM                   ----- Message -----         From: Nada Libman, MD         Sent: 12/28/2011   2:55 PM           To: Reuel Derby, Melene Plan, RN            12/28/2011, the patient had the following procedures:             1.  ultrasound access left femoral artery       2.  abdominal aortogram       3.  first order catheterization        4.  celiac artery angiogram       5.  first order catheterization (superior mesenteric artery)       6.  superior mesenteric artery angiogram            The patient needs to come back to see me in 6 months with a repeat mesenteric artery ultrasound. He will need to be n.p.o. the night before the procedure

## 2011-12-29 ENCOUNTER — Other Ambulatory Visit: Payer: Self-pay | Admitting: *Deleted

## 2011-12-29 DIAGNOSIS — Z48812 Encounter for surgical aftercare following surgery on the circulatory system: Secondary | ICD-10-CM

## 2011-12-29 DIAGNOSIS — I2510 Atherosclerotic heart disease of native coronary artery without angina pectoris: Secondary | ICD-10-CM

## 2011-12-29 LAB — GLUCOSE, CAPILLARY: Glucose-Capillary: 111 mg/dL — ABNORMAL HIGH (ref 70–99)

## 2011-12-29 LAB — CARDIAC PANEL(CRET KIN+CKTOT+MB+TROPI)
Relative Index: INVALID (ref 0.0–2.5)
Troponin I: <0.3 ng/mL (ref <0.30)

## 2011-12-29 LAB — CBC
Hemoglobin: 8.6 g/dL — ABNORMAL LOW (ref 13.0–17.0)
MCH: 33.2 pg (ref 26.0–34.0)
MCV: 98.1 fL (ref 78.0–100.0)
RBC: 2.59 MIL/uL — ABNORMAL LOW (ref 4.22–5.81)

## 2011-12-29 MED ORDER — DILTIAZEM HCL ER COATED BEADS 360 MG PO CP24
360.0000 mg | ORAL_CAPSULE | Freq: Every day | ORAL | Status: DC
  Administered 2011-12-29 – 2012-01-02 (×5): 360 mg via ORAL
  Filled 2011-12-29 (×7): qty 1

## 2011-12-29 NOTE — Care Management Note (Signed)
  Page 2 of 2   01/09/2012     5:06:40 PM   CARE MANAGEMENT NOTE 01/09/2012  Patient:  [name], [name]   Account Number:  [id number]  Date Initiated:  12/29/2011  Documentation initiated by:  SIMMONS,Chastin Garlitz  Subjective/Objective Assessment:   ADMITTED WITH [place]; LIVES AT HOME WITH WIFE- ELYCE AND DTR- MARY IN [place]; WAS IPTA; USES MAIL ORDER PHARMACY FOR MOST MEDS BUT USES [facility] ON [address] IN [place] LOCALLY.     Action/Plan:   DISCHARGE PLANNING DISCUSSED WITH PT AT BEDSIDE; AWAITING CABG.   Anticipated DC Date:  01/08/2012   Anticipated DC Plan:  HOME W HOME HEALTH SERVICES      DC Planning Services  CM consult      [facility] Choice  DURABLE MEDICAL EQUIPMENT   Choice offered to / List presented to:     DME arranged  Levan Hurst      DME agency  [facility]        Status of service:  Completed, signed off Medicare Important Message given?   (If response is "NO", the following Medicare IM given date fields will be blank) Date Medicare IM given:   Date Additional Medicare IM given:    Discharge Disposition:    Per UR Regulation:  Reviewed for med. necessity/level of care/duration of stay  If discussed at Long Length of Stay Meetings, dates discussed:    Comments:  01/09/2012 1700 Contacted pt and left message to follow up. No HH orders written at d/c. Left message for pt to return call. Isidoro Donning RN CCM Case Mgmt phone [phone]  01/07/12  1118  Leviathan Macera SIMMONS RN, BSN [phone] RECEIVED REFERRAL FOR RW; REFERRAL PLACED TO [place] WITH [facility] FOR DME.   01/04/12 JULIE AMERSON,RN,BSN 1100 PT S/P REDO CABG X 4 ON 01/03/12.  PTA, PT INDEPENDENT, LIVES WITH WIFE, DAUGHTER AND GRANDCHILDREN.  MET WITH PT TO DISCUSS DC PLANS.  FAMILY TO PROVIDE CARE AT DISCHARGE. WILL FOLLOW FOR HOME NEEDS AS PT PROGRESSES.  12/29/11  1053  Inette Doubrava SIMMONS RN, BSN [phone] NCM WILL FOLLOW AS PT PROGRESSES.

## 2011-12-29 NOTE — Progress Notes (Signed)
ANTICOAGULATION CONSULT NOTE  Pharmacy Consult for Heparin Indication: chest pain/ACS, awaiting redo CABG  Allergies  Allergen Reactions  . Ace Inhibitors Cough  . Ibuprofen Other (See Comments)    GI Issues    Labs:  Basename 12/29/11 0620 12/28/11 2318 12/28/11 1728 12/28/11 0814 12/28/11 0525 12/27/11 0705  HGB 8.6* -- -- -- 8.5* --  HCT 25.4* -- -- -- 25.0* 27.5*  PLT 129* -- -- -- 130* 126*  APTT -- -- -- -- -- --  LABPROT 14.4 -- -- -- 14.4 14.6  INR 1.10 -- -- -- 1.10 1.12  HEPARINUNFRC 0.11* -- -- -- 0.38 0.66  CREATININE -- -- -- -- -- --  CKTOTAL -- 57 69 64 -- --  CKMB -- 1.5 1.7 1.6 -- --  TROPONINI -- <0.30 <0.30 <0.30 -- --    Estimated Creatinine Clearance: 45.1 ml/min (by C-G formula based on Cr of 1.41).  Assessment: 31 to male with CAD s/p cath, awaiting possible re-do CABG, for heparin.   Heparin level = 0.11, just restarted at 4:30 am after mesenteric angiogram  Goal of Therapy:  Heparin level 0.3-0.7 units/ml Monitor platelets by anticoagulation protocol: Yes   Plan:  -Continue Heparin at 900 units/hr -Recheck heparin level at 1300 pm  Okey Regal, PharmD 12/29/2011,7:25 AM

## 2011-12-29 NOTE — Progress Notes (Signed)
ANTICOAGULATION CONSULT NOTE  Pharmacy Consult for Heparin Indication: chest pain/ACS, awaiting redo CABG  Labs:  Basename 12/29/11 1426 12/29/11 0620 12/28/11 2318 12/28/11 1728 12/28/11 0814 12/28/11 0525 12/27/11 0705  HGB -- 8.6* -- -- -- 8.5* --  HCT -- 25.4* -- -- -- 25.0* 27.5*  PLT -- 129* -- -- -- 130* 126*  APTT -- -- -- -- -- -- --  LABPROT -- 14.4 -- -- -- 14.4 14.6  INR -- 1.10 -- -- -- 1.10 1.12  HEPARINUNFRC 0.25* 0.11* -- -- -- 0.38 --  CREATININE -- -- -- -- -- -- --  CKTOTAL -- -- 57 69 64 -- --  CKMB -- -- 1.5 1.7 1.6 -- --  TROPONINI -- -- <0.30 <0.30 <0.30 -- --    Estimated Creatinine Clearance: 45.1 ml/min (by C-G formula based on Cr of 1.41).  Assessment: 43 to male with CAD s/p cath, awaiting possible re-do CABG, for heparin.     Heparin level continues to be subtherapeutic. No bleeding issues noted.  Goal of Therapy:  Heparin level 0.3-0.7 units/ml Monitor platelets by anticoagulation protocol: Yes   Plan:  -Increase Heparin to 1000 units/hr -Follow up AM  [name] PharmD 12/29/2011,4:33 PM

## 2011-12-29 NOTE — Progress Notes (Signed)
12/29/2011 8:57 AM Nursing note Pt. Called RN to room, c/o 4/10 chest pressure, non-radiating. States it "feels like indigestion". PRN Maalox given. BP 148/60 HR 62. EKG performed. Slight increase in ST segment in leads V2 and V3. Dr. [name] paged and made aware. Pt. Already on NTG gtt however, verbal orders received to administer one SL NTG in addition to gtt per Dr. [name]. Orders enacted. Will continue to closely monitor patient. Instructed pt. To promptly notify RN if any increase or change in chest discomfort.  Analena Gama, Blanchard Kelch

## 2011-12-29 NOTE — Progress Notes (Signed)
12/29/2011 0910  Nursing note Upon reassessment of patient, states he is pain free at this time. Instructed patient to inform RN promptly if pain returns.  Cianna Kasparian, Blanchard Kelch

## 2011-12-29 NOTE — Progress Notes (Signed)
Pt's IV Heparin was not restarted for 9+ hrs post-procedure due to miscommunication about restarting Heparin between shifts.  Heparin now restarted, notified both pharmacy and on-call physician.  SCD's have been worn continuously since 2230.  Will continue to monitor.  Arva Chafe

## 2011-12-29 NOTE — Progress Notes (Signed)
Patient Name: [name] Date of Encounter: 12/29/2011    SUBJECTIVE:No chest pain.  TELEMETRY:  NSR: Filed Vitals:   12/28/11 2000 12/28/11 2100 12/28/11 2200 12/29/11 0543  BP: 134/65 134/73 122/63 115/61  Pulse: 86 81 77 60  Temp: 98.6 F (37 C)   98.4 F (36.9 C)  TempSrc: Oral   Oral  Resp: 16  18 18   Height:      Weight:      SpO2: 93%   96%    Intake/Output Summary (Last 24 hours) at 12/29/11 0736 Last data filed at 12/29/11 0700  Gross per 24 hour  Intake    336 ml  Output   1700 ml  Net  -1364 ml    LABS: Basic Metabolic Panel:  Basename 12/26/11 1230  NA --  K --  CL --  CO2 --  GLUCOSE --  BUN --  CREATININE --  CALCIUM --  MG 1.0*  PHOS --   CBC:  Basename 12/29/11 0620 12/28/11 0525  WBC 5.8 6.2  NEUTROABS -- --  HGB 8.6* 8.5*  HCT 25.4* 25.0*  MCV 98.1 98.0  PLT 129* 130*   Cardiac Enzymes:  Basename 12/28/11 2318 12/28/11 1728 12/28/11 0814  CKTOTAL 57 69 64  CKMB 1.5 1.7 1.6  CKMBINDEX -- -- --  TROPONINI <0.30 <0.30 <0.30   Radiology/Studies:  No new  Physical Exam: Blood pressure 115/61, pulse 60, temperature 98.4 F (36.9 C), temperature source Oral, resp. rate 18, height 5\' 9"  (1.753 m), weight 69.4 kg (153 lb), SpO2 96.00%. Weight change:    S4  Left groin okay  ASSESSMENT:  1. Unstable angina with no current symptoms 2. Anemia requiring transfusion felt secondary to acute on chronic blood loss related to IV heparin therapy. Hemoglobin is currently stable 3. Abdominal angiography reveals patent vessels and stable blood flow   Plan:  1. Await CABG 2. Monitor hgb.  Selinda Eon 12/29/2011, 7:36 AM

## 2011-12-29 NOTE — Progress Notes (Signed)
Pt's BP has come down to 122/63 during last frequent vitals set.  Will continue to monitor.  Arva Chafe

## 2011-12-29 NOTE — Progress Notes (Signed)
1 Day Post-Op Procedure(s) (LRB): VISCERAL ANGIOGRAM (Bilateral) Subjective: Had some chest discomfort this am.  On heparin and NTG.   Objective: Vital signs in last 24 hours: Temp:  [98.3 F (36.8 C)-98.6 F (37 C)] 98.3 F (36.8 C) (06/19 1509) Pulse Rate:  [60-86] 60  (06/19 1509) Cardiac Rhythm:  [-] Normal sinus rhythm;Atrial paced (06/19 0835) Resp:  [16-18] 18  (06/19 1509) BP: (103-134)/(57-73) 103/57 mmHg (06/19 1509) SpO2:  [93 %-97 %] 97 % (06/19 1509)  Hemodynamic parameters for last 24 hours:    Intake/Output from previous day: 06/18 0701 - 06/19 0700 In: 336 [I.V.:336] Out: 1700 [Urine:1700] Intake/Output this shift:    General appearance: alert and cooperative Heart: regular rate and rhythm, S1, S2 normal, no murmur, click, rub or gallop Lungs: clear to auscultation bilaterally  Lab Results:  Basename 12/29/11 0620 12/28/11 0525  WBC 5.8 6.2  HGB 8.6* 8.5*  HCT 25.4* 25.0*  PLT 129* 130*   BMET: No results found for this basename: NA:2,K:2,CL:2,CO2:2,GLUCOSE:2,BUN:2,CREATININE:2,CALCIUM:2 in the last 72 hours  PT/INR:  Basename 12/29/11 0620  LABPROT 14.4  INR 1.10   ABG    Component Value Date/Time   PHART 7.428 12/25/2011 0629   HCO3 25.8* 12/25/2011 0629   TCO2 27.0 12/25/2011 0629   O2SAT 97.0 12/25/2011 0629   CBG (last 3)   Basename 12/29/11 1640 12/29/11 1132 12/29/11 0638  GLUCAP 111* 139* 110*    Assessment/Plan: S/P Procedure(s) (LRB): VISCERAL ANGIOGRAM (Bilateral)  I discussed the results of his arteriogram with Dr. [name].  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 I think his risk of mesenteric stenosis is increased but not prohibitive and we should proceed with redo CABG.  I can't schedule until Monday and he needs a few days after angio anyway to minimize risk of renal failure. Will check creat tomorrow. Discussed with patient.   LOS: 5 days    Journie Howson K 12/29/2011

## 2011-12-30 ENCOUNTER — Other Ambulatory Visit: Payer: Self-pay

## 2011-12-30 DIAGNOSIS — I2510 Atherosclerotic heart disease of native coronary artery without angina pectoris: Secondary | ICD-10-CM

## 2011-12-30 LAB — BASIC METABOLIC PANEL
BUN: 21 mg/dL (ref 6–23)
Calcium: 8 mg/dL — ABNORMAL LOW (ref 8.4–10.5)
GFR calc Af Amer: 56 mL/min — ABNORMAL LOW (ref >90)
GFR calc non Af Amer: 48 mL/min — ABNORMAL LOW (ref >90)
Glucose, Bld: 123 mg/dL — ABNORMAL HIGH (ref 70–99)
Potassium: 4.2 mEq/L (ref 3.5–5.1)

## 2011-12-30 LAB — GLUCOSE, CAPILLARY
Glucose-Capillary: 129 mg/dL — ABNORMAL HIGH (ref 70–99)
Glucose-Capillary: 106 mg/dL — ABNORMAL HIGH (ref 70–99)
Glucose-Capillary: 141 mg/dL — ABNORMAL HIGH (ref 70–99)

## 2011-12-30 LAB — TYPE AND SCREEN
ABO/RH(D): O POS
Antibody Screen: NEGATIVE
Unit division: 0

## 2011-12-30 LAB — CBC
HCT: 23.1 % — ABNORMAL LOW (ref 39.0–52.0)
MCHC: 33.8 g/dL (ref 30.0–36.0)
MCV: 98.7 fL (ref 78.0–100.0)
Platelets: 121 10*3/uL — ABNORMAL LOW (ref 150–400)
RDW: 15 % (ref 11.5–15.5)
WBC: 6.1 10*3/uL (ref 4.0–10.5)

## 2011-12-30 LAB — PROTIME-INR: INR: 1.09 (ref 0.00–1.49)

## 2011-12-30 NOTE — Progress Notes (Signed)
SBP 85 manually.  Pt relates feeling "drowsy."  MD notified.  Orders received and carried out.  Will recheck BP in one hour.

## 2011-12-30 NOTE — Progress Notes (Signed)
ANTICOAGULATION CONSULT NOTE - Follow Up Consult  Pharmacy Consult for Heparin Indication: chest pain/ACS, awaiting redo CABG   Allergies  Allergen Reactions  . Ace Inhibitors Cough  . Ibuprofen Other (See Comments)    GI Issues   Vital Signs: Temp: 98.3 F (36.8 C) (06/20 0432) Temp src: Oral (06/20 0432) BP: 111/58 mmHg (06/20 0432) Pulse Rate: 58  (06/20 0432)  Labs:  Basename 12/30/11 0530 12/29/11 1426 12/29/11 0620 12/28/11 2318 12/28/11 1728 12/28/11 0814 12/28/11 0525  HGB 7.8* -- 8.6* -- -- -- --  HCT 23.1* -- 25.4* -- -- -- 25.0*  PLT 121* -- 129* -- -- -- 130*  APTT -- -- -- -- -- -- --  LABPROT 14.3 -- 14.4 -- -- -- 14.4  INR 1.09 -- 1.10 -- -- -- 1.10  HEPARINUNFRC 0.33 0.25* 0.11* -- -- -- --  CREATININE 1.39* -- -- -- -- -- --  CKTOTAL -- -- -- 57 69 64 --  CKMB -- -- -- 1.5 1.7 1.6 --  TROPONINI -- -- -- <0.30 <0.30 <0.30 --    Estimated Creatinine Clearance: 45.8 ml/min (by C-G formula based on Cr of 1.39).   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 He is awaiting CABG next week. No bleeding but drop in Hgb noted, plts stable.  Goal of Therapy:  Heparin level 0.3-0.7 units/ml Monitor platelets by anticoagulation protocol: Yes   Plan:  1) Continue heparin at 1000 units/hr 2) Follow up heparin level, CBC in AM  Fredrik Rigger 12/30/2011,8:45 AM

## 2011-12-30 NOTE — Progress Notes (Signed)
Patient Name: [name] Date of Encounter: 12/30/2011    SUBJECTIVE:He had angina yesterday. Required SL NTG in addition to IV to relieve. He has had none since that time. Read and appreciate Dr. Sharee Pimple recommendation.  TELEMETRY:  NSR/ A paced Filed Vitals:   12/29/11 1021 12/29/11 1509 12/29/11 2109 12/30/11 0432  BP:  103/57 136/66 111/58  Pulse: 60 60 60 58  Temp:  98.3 F (36.8 C) 98.8 F (37.1 C) 98.3 F (36.8 C)  TempSrc:  Oral Oral Oral  Resp:  18 18 19   Height:      Weight:      SpO2:  97% 98% 99%    Intake/Output Summary (Last 24 hours) at 12/30/11 0942 Last data filed at 12/30/11 0839  Gross per 24 hour  Intake 1039.4 ml  Output    801 ml  Net  238.4 ml    LABS: Basic Metabolic Panel:  Basename 12/30/11 0530  NA 139  K 4.2  CL 110  CO2 23  GLUCOSE 123*  BUN 21  CREATININE 1.39*  CALCIUM 8.0*  MG --  PHOS --   CBC:  Basename 12/30/11 0530 12/29/11 0620  WBC 6.1 5.8  NEUTROABS -- --  HGB 7.8* 8.6*  HCT 23.1* 25.4*  MCV 98.7 98.1  PLT 121* 129*   Cardiac Enzymes:  Basename 12/28/11 2318 12/28/11 1728 12/28/11 0814  CKTOTAL 57 69 64  CKMB 1.5 1.7 1.6  CKMBINDEX -- -- --  TROPONINI <0.30 <0.30 <0.30   Radiology/Studies:  No new.  Physical Exam: Blood pressure 111/58, pulse 58, temperature 98.3 F (36.8 C), temperature source Oral, resp. rate 19, height 5\' 9"  (1.753 m), weight 69.4 kg (153 lb), SpO2 99.00%. Weight change:    Right groin cath site without hematoma.  Chest is clear to auscultation and percussion  No gallop. 2/6 soft systolic Murmur   ASSESSMENT:  1. CAD with SVG failure and progression of native disease with unstable angina. 2. 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 Chronic kidney disease stable post contrast. 4. Anemia with acute blood loss component on chronic   Plan:  1. Plan CABG next week. 2. Continue hydration 3. Suppress angina with IV NTG and anticoagulation 4.  Follow hemoglobin  Signed, Lesleigh Noe 12/30/2011, 9:42 AM

## 2011-12-31 ENCOUNTER — Encounter (HOSPITAL_COMMUNITY): Payer: Medicare Other

## 2011-12-31 DIAGNOSIS — Z01810 Encounter for preprocedural cardiovascular examination: Secondary | ICD-10-CM

## 2011-12-31 DIAGNOSIS — I2510 Atherosclerotic heart disease of native coronary artery without angina pectoris: Secondary | ICD-10-CM

## 2011-12-31 LAB — CBC
HCT: 24.1 % — ABNORMAL LOW (ref 39.0–52.0)
MCHC: 33.2 g/dL (ref 30.0–36.0)
Platelets: 121 10*3/uL — ABNORMAL LOW (ref 150–400)
RDW: 14.8 % (ref 11.5–15.5)
WBC: 6.7 10*3/uL (ref 4.0–10.5)

## 2011-12-31 LAB — PROTIME-INR
INR: 1.05 (ref 0.00–1.49)
Prothrombin Time: 13.9 seconds (ref 11.6–15.2)

## 2011-12-31 LAB — GLUCOSE, CAPILLARY
Glucose-Capillary: 115 mg/dL — ABNORMAL HIGH (ref 70–99)
Glucose-Capillary: 183 mg/dL — ABNORMAL HIGH (ref 70–99)
Glucose-Capillary: 120 mg/dL — ABNORMAL HIGH (ref 70–99)
Glucose-Capillary: 142 mg/dL — ABNORMAL HIGH (ref 70–99)

## 2011-12-31 LAB — BASIC METABOLIC PANEL
Calcium: 8 mg/dL — ABNORMAL LOW (ref 8.4–10.5)
GFR calc Af Amer: 68 mL/min — ABNORMAL LOW (ref >90)
GFR calc non Af Amer: 59 mL/min — ABNORMAL LOW (ref >90)
Glucose, Bld: 127 mg/dL — ABNORMAL HIGH (ref 70–99)
Potassium: 4.5 mEq/L (ref 3.5–5.1)
Sodium: 141 mEq/L (ref 135–145)

## 2011-12-31 LAB — PREPARE RBC (CROSSMATCH)

## 2011-12-31 LAB — HEPARIN LEVEL (UNFRACTIONATED): Heparin Unfractionated: 0.49 IU/mL (ref 0.30–0.70)

## 2011-12-31 MED ORDER — ALPRAZOLAM 0.25 MG PO TABS: 0.2500 mg | ORAL_TABLET | ORAL | Status: DC | PRN

## 2011-12-31 NOTE — Progress Notes (Addendum)
VASCULAR LAB PRELIMINARY  PRELIMINARY  PRELIMINARY  PRELIMINARY  Pre-op Cardiac Surgery  Carotid Findings:  60-79% ICA stenosis (low on the right and high end of range on the left).  Vertebral artery flow is antegrade bilaterally.    Upper Extremity Right Left  Brachial Pressures 146 T 150 T  Radial Waveforms T T  Ulnar Waveforms T T  Palmar Arch (Allen's Test) WNL WNL   Findings:  Doppler waveforms remain normal with ulnar and radial compressions.    Lower  Extremity Right Left  Dorsalis Pedis 152 DM 128 DM  Anterior Tibial    Posterior Tibial 159 M 143 M  Ankle/Brachial Indices >1.0 0.95    Findings:  ABI is within normal limits with abnormal Doppler waveforms bilaterally.   [name], 12/31/2011, 10:39 AM

## 2011-12-31 NOTE — Progress Notes (Signed)
ANTICOAGULATION CONSULT NOTE - Follow Up Consult  Pharmacy Consult:  Heparin Indication: chest pain/ACS, awaiting redo CABG   Allergies  Allergen Reactions  . Ace Inhibitors Cough  . Ibuprofen Other (See Comments)    GI Issues   Vital Signs: Temp: 97.9 F (36.6 C) (06/21 0426) Temp src: Oral (06/21 0426) BP: 110/64 mmHg (06/21 0426) Pulse Rate: 60  (06/21 1150)  Labs:  Alvira Philips 12/31/11 0631 12/30/11 0530 12/29/11 1426 12/29/11 0620 12/28/11 2318 12/28/11 1728  HGB 8.0* 7.8* -- -- -- --  HCT 24.1* 23.1* -- 25.4* -- --  PLT 121* 121* -- 129* -- --  APTT -- -- -- -- -- --  LABPROT 13.9 14.3 -- 14.4 -- --  INR 1.05 1.09 -- 1.10 -- --  HEPARINUNFRC 0.49 0.33 0.25* -- -- --  CREATININE 1.18 1.39* -- -- -- --  CKTOTAL -- -- -- -- 57 69  CKMB -- -- -- -- 1.5 1.7  TROPONINI -- -- -- -- <0.30 <0.30    Estimated Creatinine Clearance: 53.9 ml/min (by C-G formula based on Cr of 1.18).      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 He is awaiting CABG next week. No bleeding but patient to get transfusion for decrease in hemoglobin.   Goal of Therapy:  Heparin level 0.3-0.7 units/ml Monitor platelets by anticoagulation protocol: Yes     Plan:  - Continue heparin at 1000 units/hr - Daily HL / CBC     Jonathan Corpus D. Laney Potash, PharmD, BCPS Pager:  [phone] 12/31/2011, 1:03 PM

## 2011-12-31 NOTE — Progress Notes (Signed)
3 Days Post-Op Procedure(s) (LRB): VISCERAL ANGIOGRAM (Bilateral) Subjective: Some chest discomfort last pm. Episode of weakness and hypotension resolved with decreased NTG.   Objective: Vital signs in last 24 hours: Temp:  [97.5 F (36.4 C)-98.2 F (36.8 C)] 97.5 F (36.4 C) (06/21 1418) Pulse Rate:  [58-60] 58  (06/21 1418) Cardiac Rhythm:  [-] Atrial paced (06/21 0730) Resp:  [20] 20  (06/21 1418) BP: (110-138)/(60-66) 130/64 mmHg (06/21 1418) SpO2:  [98 %-100 %] 98 % (06/21 1418)  Hemodynamic parameters for last 24 hours:    Intake/Output from previous day: 06/20 0701 - 06/21 0700 In: 720 [P.O.:720] Out: 650 [Urine:650] Intake/Output this shift: Total I/O In: 480 [P.O.:480] Out: 700 [Urine:700]  General appearance: alert and cooperative Heart: regular rate and rhythm, S1, S2 normal, no murmur, click, rub or gallop Lungs: clear to auscultation bilaterally  Lab Results:  Basename 12/31/11 0631 12/30/11 0530  WBC 6.7 6.1  HGB 8.0* 7.8*  HCT 24.1* 23.1*  PLT 121* 121*   BMET:  Basename 12/31/11 0631 12/30/11 0530  NA 141 139  K 4.5 4.2  CL 111 110  CO2 22 23  GLUCOSE 127* 123*  BUN 17 21  CREATININE 1.18 1.39*  CALCIUM 8.0* 8.0*    PT/INR:  Basename 12/31/11 0631  LABPROT 13.9  INR 1.05   ABG    Component Value Date/Time   PHART 7.428 12/25/2011 0629   HCO3 25.8* 12/25/2011 0629   TCO2 27.0 12/25/2011 0629   O2SAT 97.0 12/25/2011 0629   CBG (last 3)   Basename 12/31/11 1135 12/31/11 0606 12/30/11 2131  GLUCAP 183* 115* 141*    Assessment/Plan: Stable for Redo CABG Monday. I discussed the operative procedure with the patient and family including alternatives, benefits and risks; including but not limited to bleeding, blood transfusion, infection, stroke, myocardial infarction, graft failure, heart block requiring a permanent pacemaker, organ dysfunction, and death.  [name] understands and agrees to proceed.    LOS: 7 days     [name] K 12/31/2011

## 2011-12-31 NOTE — Progress Notes (Addendum)
Patient Name: [name] Date of Encounter: 12/31/2011    SUBJECTIVE: He had an episode of profound weakness and associated hypotension yesterday. This was not associated with chest or abdominal pain. He denied nausea. A similar episode occurred last Friday night. Nitroglycerin was discontinued for a short while and the blood pressure recovered.  TELEMETRY:  Normal sinus rhythm: Filed Vitals:   12/30/11 1357 12/30/11 1637 12/30/11 2038 12/31/11 0426  BP: 85/45 115/60 138/66 110/64  Pulse: 60  59 59  Temp: 97 F (36.1 C)  98.2 F (36.8 C) 97.9 F (36.6 C)  TempSrc: Oral  Oral Oral  Resp: 18  20 20   Height:      Weight:      SpO2: 98%  100% 98%    Intake/Output Summary (Last 24 hours) at 12/31/11 1007 Last data filed at 12/31/11 0936  Gross per 24 hour  Intake    720 ml  Output   1150 ml  Net   -430 ml    LABS: Basic Metabolic Panel:  Basename 12/31/11 0631 12/30/11 0530  NA 141 139  K 4.5 4.2  CL 111 110  CO2 22 23  GLUCOSE 127* 123*  BUN 17 21  CREATININE 1.18 1.39*  CALCIUM 8.0* 8.0*  MG -- --  PHOS -- --   CBC:  Basename 12/31/11 0631 12/30/11 0530  WBC 6.7 6.1  NEUTROABS -- --  HGB 8.0* 7.8*  HCT 24.1* 23.1*  MCV 99.2 98.7  PLT 121* 121*   Cardiac Enzymes:  Basename 12/28/11 2318 12/28/11 1728  CKTOTAL 57 69  CKMB 1.5 1.7  CKMBINDEX -- --  TROPONINI <0.30 <0.30   Radiology/Studies:  No new.  Physical Exam: Blood pressure 110/64, pulse 59, temperature 97.9 F (36.6 C), temperature source Oral, resp. rate 20, height 5\' 9"  (1.753 m), weight 69.4 kg (153 lb), SpO2 98.00%. Weight change:    S4 gallop. 1 / 6 systolic murmur.  No JVD  No edema. No hematoma in the recent cath site  ASSESSMENT:  1. Unstable angina pectoris, awaiting surgery.  2. Severe anemia with superimposed acute on chronic blood loss (seems likely given the history of intestinal ischemia)  3. Recurring hypotension, possibly ischemia related versus neurally  mediated  4. Abdominal visceral obstructive disease   Plan:  1. Transfusion prior to surgery.   2. Continue IV heparin and nitroglycerin until surgery on Monday  3. Type and cross for 2 units of packed red blood cells  Signed, Lesleigh Noe 12/31/2011, 10:07 AM

## 2011-12-31 NOTE — Progress Notes (Signed)
1st unit of blood administered. Patient did not have a reaction and tolerated well. Vitals are documented. [name]

## 2012-01-01 DIAGNOSIS — I209 Angina pectoris, unspecified: Secondary | ICD-10-CM

## 2012-01-01 LAB — CBC
HCT: 28 % — ABNORMAL LOW (ref 39.0–52.0)
MCH: 33 pg (ref 26.0–34.0)
MCHC: 34.3 g/dL (ref 30.0–36.0)
RDW: 16.3 % — ABNORMAL HIGH (ref 11.5–15.5)

## 2012-01-01 LAB — GLUCOSE, CAPILLARY: Glucose-Capillary: 136 mg/dL — ABNORMAL HIGH (ref 70–99)

## 2012-01-01 LAB — PROTIME-INR: INR: 1.11 (ref 0.00–1.49)

## 2012-01-01 LAB — PREPARE RBC (CROSSMATCH)

## 2012-01-01 MED ORDER — BISACODYL 5 MG PO TBEC
5.0000 mg | DELAYED_RELEASE_TABLET | Freq: Once | ORAL | Status: AC
  Administered 2012-01-02: 5 mg via ORAL
  Filled 2012-01-01: qty 1

## 2012-01-01 MED ORDER — TEMAZEPAM 15 MG PO CAPS: 15.0000 mg | ORAL_CAPSULE | Freq: Once | ORAL | Status: AC | PRN

## 2012-01-01 MED ORDER — METOPROLOL TARTRATE 12.5 MG HALF TABLET
12.5000 mg | ORAL_TABLET | Freq: Once | ORAL | Status: AC
  Administered 2012-01-03: 12.5 mg via ORAL
  Filled 2012-01-01: qty 1

## 2012-01-01 MED ORDER — CHLORHEXIDINE GLUCONATE 4 % EX LIQD
60.0000 mL | Freq: Once | CUTANEOUS | Status: DC
  Filled 2012-01-01: qty 60

## 2012-01-01 MED ORDER — DIAZEPAM 5 MG PO TABS
5.0000 mg | ORAL_TABLET | Freq: Once | ORAL | Status: AC
  Administered 2012-01-03: 5 mg via ORAL
  Filled 2012-01-01: qty 1

## 2012-01-01 NOTE — Progress Notes (Signed)
ANTICOAGULATION CONSULT NOTE - Follow Up Consult  Pharmacy Consult for Heparin Indication: chest pain/ACS, awaiting redo CABG   Allergies  Allergen Reactions  . Ace Inhibitors Cough  . Ibuprofen Other (See Comments)    GI Issues   Vital Signs: Temp: 98 F (36.7 C) (06/22 0402) Temp src: Oral (06/22 0402) BP: 137/61 mmHg (06/22 0940) Pulse Rate: 60  (06/22 0940)  Labs:  Alvira Philips 01/01/12 0605 12/31/11 0631 12/30/11 0530  HGB 9.6* 8.0* --  HCT 28.0* 24.1* 23.1*  PLT 120* 121* 121*  APTT -- -- --  LABPROT 14.5 13.9 14.3  INR 1.11 1.05 1.09  HEPARINUNFRC 0.31 0.49 0.33  CREATININE -- 1.18 1.39*  CKTOTAL -- -- --  CKMB -- -- --  TROPONINI -- -- --    Estimated Creatinine Clearance: 53.9 ml/min (by C-G formula based on Cr of 1.18).  Medications:  Heparin @ 1000 units/hr  Assessment: 76yom continues on heparin with a therapeutic heparin level awaiting redo CABG on Monday. No bleeding per chart notes. Hgb improved after blood transfusion yesterday.  Goal of Therapy:  Heparin level 0.3-0.7 units/ml Monitor platelets by anticoagulation protocol: Yes   Plan:  1) Increase heparin to 1100 units/hr 2) Follow up heparin level and CBC in AM  Fredrik Rigger 01/01/2012,10:26 AM

## 2012-01-01 NOTE — Progress Notes (Signed)
Patient has received 2nd unit of transfused blood. Patient experienced no reactions. Vitals: 98.5, 60, 20 134/58. Harmon Pier

## 2012-01-01 NOTE — Progress Notes (Signed)
Patient ID: [name], male   DOB: February 25, 1936, 76 y.o.   MRN: [id number] Subjective:  Chest pain and sob have resolved after blood transfusion.  Objective:  Vital Signs in the last 24 hours: Temp:  [97.5 F (36.4 C)-98.6 F (37 C)] 98 F (36.7 C) (06/22 0402) Pulse Rate:  [58-60] 60  (06/22 0402) Resp:  [20] 20  (06/22 0402) BP: (124-152)/(54-74) 127/62 mmHg (06/22 0402) SpO2:  [97 %-98 %] 97 % (06/22 0402)  Intake/Output from previous day: 06/21 0701 - 06/22 0700 In: 2040.2 [P.O.:840; I.V.:500; Blood:700.2] Out: 1550 [Urine:1550] Intake/Output from this shift:    Physical Exam: Well appearing NAD HEENT: Unremarkable Neck:  No JVD, no thyromegally Lungs:  Clear HEART:  Regular rate rhythm, no murmurs, no rubs, no clicks Abd:  Flat, positive bowel sounds, no organomegally, no rebound, no guarding Ext:  2 plus pulses, no edema, no cyanosis, no clubbing Skin:  No rashes no nodules Neuro:  CN II through XII intact, motor grossly intact  Lab Results:  Basename 01/01/12 0605 12/31/11 0631  WBC 6.4 6.7  HGB 9.6* 8.0*  PLT 120* 121*    Basename 12/31/11 0631 12/30/11 0530  NA 141 139  K 4.5 4.2  CL 111 110  CO2 22 23  GLUCOSE 127* 123*  BUN 17 21  CREATININE 1.18 1.39*   No results found for this basename: TROPONINI:2,CK,MB:2 in the last 72 hours Hepatic Function Panel No results found for this basename: PROT,ALBUMIN,AST,ALT,ALKPHOS,BILITOT,BILIDIR,IBILI in the last 72 hours No results found for this basename: CHOL in the last 72 hours No results found for this basename: PROTIME in the last 72 hours  Imaging: No results found.  Cardiac Studies: Tele - NSR Assessment/Plan:  1. [place] 2. CAD s/p Prior CABG 3. H/o brady, s/p PPM 4. PAF 5. Anemia, s/p transfusion Rec: note plans for CABG in 2 days. Follow CBC. Continue IV nitro for now. Check [place].  LOS: 8 days    Lewayne Bunting 01/01/2012, 8:27 AM

## 2012-01-02 ENCOUNTER — Encounter (HOSPITAL_COMMUNITY): Payer: Self-pay | Admitting: Anesthesiology

## 2012-01-02 LAB — CBC
Hemoglobin: 10 g/dL — ABNORMAL LOW (ref 13.0–17.0)
MCHC: 34 g/dL (ref 30.0–36.0)
Platelets: 126 10*3/uL — ABNORMAL LOW (ref 150–400)
RDW: 16.3 % — ABNORMAL HIGH (ref 11.5–15.5)

## 2012-01-02 LAB — BASIC METABOLIC PANEL
BUN: 14 mg/dL (ref 6–23)
CO2: 23 mEq/L (ref 19–32)
Chloride: 110 mEq/L (ref 96–112)
Creatinine, Ser: 1.1 mg/dL (ref 0.50–1.35)
Glucose, Bld: 109 mg/dL — ABNORMAL HIGH (ref 70–99)

## 2012-01-02 LAB — HEPARIN LEVEL (UNFRACTIONATED): Heparin Unfractionated: 0.58 IU/mL (ref 0.30–0.70)

## 2012-01-02 LAB — GLUCOSE, CAPILLARY
Glucose-Capillary: 163 mg/dL — ABNORMAL HIGH (ref 70–99)
Glucose-Capillary: 104 mg/dL — ABNORMAL HIGH (ref 70–99)

## 2012-01-02 LAB — PROTIME-INR
INR: 1.04 (ref 0.00–1.49)
Prothrombin Time: 13.8 seconds (ref 11.6–15.2)

## 2012-01-02 MED ORDER — TRANEXAMIC ACID (OHS) BOLUS VIA INFUSION
15.0000 mg/kg | INTRAVENOUS | Status: DC
  Filled 2012-01-02: qty 1041

## 2012-01-02 MED ORDER — DEXTROSE 5 % IV SOLN
1.5000 g | INTRAVENOUS | Status: AC
  Administered 2012-01-03: 1.5 g via INTRAVENOUS
  Administered 2012-01-03: .75 g via INTRAVENOUS
  Filled 2012-01-02: qty 1.5

## 2012-01-02 MED ORDER — POTASSIUM CHLORIDE 2 MEQ/ML IV SOLN
80.0000 meq | INTRAVENOUS | Status: DC
  Filled 2012-01-02: qty 40

## 2012-01-02 MED ORDER — TRANEXAMIC ACID 100 MG/ML IV SOLN
1.5000 mg/kg/h | INTRAVENOUS | Status: DC
  Filled 2012-01-02: qty 25

## 2012-01-02 MED ORDER — MAGNESIUM SULFATE 50 % IJ SOLN
40.0000 meq | INTRAMUSCULAR | Status: DC
  Filled 2012-01-02: qty 10

## 2012-01-02 MED ORDER — NITROGLYCERIN IN D5W 200-5 MCG/ML-% IV SOLN
2.0000 ug/min | INTRAVENOUS | Status: AC
  Administered 2012-01-03: 16 ug/min via INTRAVENOUS
  Filled 2012-01-02: qty 250

## 2012-01-02 MED ORDER — SODIUM CHLORIDE 0.9 % IV SOLN
0.1000 ug/kg/h | INTRAVENOUS | Status: AC
  Administered 2012-01-03: .2 ug/kg/h via INTRAVENOUS
  Filled 2012-01-02: qty 4

## 2012-01-02 MED ORDER — EPINEPHRINE HCL 1 MG/ML IJ SOLN
0.5000 ug/min | INTRAVENOUS | Status: DC
  Filled 2012-01-02: qty 4

## 2012-01-02 MED ORDER — TRANEXAMIC ACID (OHS) PUMP PRIME SOLUTION
2.0000 mg/kg | INTRAVENOUS | Status: DC
  Filled 2012-01-02: qty 1.39

## 2012-01-02 MED ORDER — DEXTROSE 5 % IV SOLN
750.0000 mg | INTRAVENOUS | Status: DC
  Filled 2012-01-02: qty 750

## 2012-01-02 MED ORDER — SODIUM CHLORIDE 0.9 % IV SOLN
INTRAVENOUS | Status: AC
  Administered 2012-01-03: 1.8 [IU]/h via INTRAVENOUS
  Filled 2012-01-02: qty 1

## 2012-01-02 MED ORDER — SODIUM BICARBONATE 8.4 % IV SOLN
INTRAVENOUS | Status: AC
  Administered 2012-01-03: 09:00:00
  Filled 2012-01-02: qty 2.5

## 2012-01-02 MED ORDER — PHENYLEPHRINE HCL 10 MG/ML IJ SOLN
30.0000 ug/min | INTRAVENOUS | Status: AC
  Administered 2012-01-03: 20 ug/min via INTRAVENOUS
  Filled 2012-01-02: qty 2

## 2012-01-02 MED ORDER — VANCOMYCIN HCL 1000 MG IV SOLR
1250.0000 mg | INTRAVENOUS | Status: AC
  Administered 2012-01-03: 1250 mg via INTRAVENOUS
  Filled 2012-01-02 (×2): qty 1250

## 2012-01-02 MED ORDER — DOPAMINE-DEXTROSE 3.2-5 MG/ML-% IV SOLN
2.0000 ug/kg/min | INTRAVENOUS | Status: DC
  Filled 2012-01-02: qty 250

## 2012-01-02 NOTE — Progress Notes (Signed)
Patient ID: [name], male   DOB: Dec 30, 1935, 76 y.o.   MRN: [id number] Subjective:   No additional chest pain or sob.  Objective:  Vital Signs in the last 24 hours: Temp:  [97.4 F (36.3 C)-98.8 F (37.1 C)] 98.8 F (37.1 C) (06/23 0441) Pulse Rate:  [60] 60  (06/23 0441) Resp:  [16-19] 18  (06/23 0441) BP: (122-134)/(62-70) 134/67 mmHg (06/23 0441) SpO2:  [96 %-97 %] 97 % (06/23 0441)  Intake/Output from previous day: 06/22 0701 - 06/23 0700 In: 480 [P.O.:480] Out: 2700 [Urine:2700] Intake/Output from this shift: Total I/O In: -  Out: 675 [Urine:675]  Physical Exam: Well appearing NAD HEENT: Unremarkable Neck:  No JVD, no thyromegally Lungs:  Clear with no wheezes. HEART:  Regular rate rhythm, no murmurs, no rubs, no clicks Abd:  Flat, positive bowel sounds, no organomegally, no rebound, no guarding Ext:  2 plus pulses, no edema, no cyanosis, no clubbing Skin:  No rashes no nodules Neuro:  CN II through XII intact, motor grossly intact  Lab Results:  Basename 01/02/12 0548 01/01/12 0605  WBC 6.7 6.4  HGB 10.0* 9.6*  PLT 126* 120*    Basename 01/02/12 0548 12/31/11 0631  NA 141 141  K 4.3 4.5  CL 110 111  CO2 23 22  GLUCOSE 109* 127*  BUN 14 17  CREATININE 1.10 1.18   No results found for this basename: TROPONINI:2,CK,MB:2 in the last 72 hours Hepatic Function Panel No results found for this basename: PROT,ALBUMIN,AST,ALT,ALKPHOS,BILITOT,BILIDIR,IBILI in the last 72 hours No results found for this basename: CHOL in the last 72 hours No results found for this basename: PROTIME in the last 72 hours  Imaging: No results found.  Cardiac Studies: Tele - NSR Assessment/Plan:  1. [place] 2. CAD s/p Prior CABG 3. H/o brady, s/p PPM 4. PAF 5. Anemia, s/p transfusion Rec: note plans for CABG tomorrow. Follow CBC. Continue IV nitro for now. Check [place].  LOS: 9 days    Lewayne Bunting 01/02/2012, 11:29 AM

## 2012-01-02 NOTE — Progress Notes (Signed)
ANTICOAGULATION CONSULT NOTE - Follow Up Consult  Pharmacy Consult for Heparin Indication: chest pain/ACS, awaiting redo CABG  Allergies  Allergen Reactions  . Ace Inhibitors Cough  . Ibuprofen Other (See Comments)    GI Issues   Vital Signs: Temp: 98.8 F (37.1 C) (06/23 0441) Temp src: Oral (06/23 0441) BP: 134/67 mmHg (06/23 0441) Pulse Rate: 60  (06/23 0441)  Labs:  Basename 01/02/12 0548 01/01/12 0605 12/31/11 0631  HGB 10.0* 9.6* --  HCT 29.4* 28.0* 24.1*  PLT 126* 120* 121*  APTT -- -- --  LABPROT 13.8 14.5 13.9  INR 1.04 1.11 1.05  HEPARINUNFRC 0.58 0.31 0.49  CREATININE 1.10 -- 1.18  CKTOTAL -- -- --  CKMB -- -- --  TROPONINI -- -- --    Estimated Creatinine Clearance: 57.8 ml/min (by C-G formula based on Cr of 1.1).   Medications:  Heparin @ 1100 units/hr  Assessment: 76yom continues on heparin with a therapeutic heparin level awaiting re-do CABG tomorrow. No bleeding per chart notes. CBC stable.  Goal of Therapy:  Heparin level 0.3-0.7 units/ml Monitor platelets by anticoagulation protocol: Yes   Plan:  1) Continue heparin at 1100 units/hr 2) Follow up after CABG 6/24  Fredrik Rigger 01/02/2012,10:40 AM

## 2012-01-03 ENCOUNTER — Inpatient Hospital Stay (HOSPITAL_COMMUNITY): Payer: Medicare Other

## 2012-01-03 ENCOUNTER — Encounter (HOSPITAL_COMMUNITY): Payer: Self-pay | Admitting: Anesthesiology

## 2012-01-03 ENCOUNTER — Encounter (HOSPITAL_COMMUNITY)
Admission: EM | Disposition: A | Discharge status: Home / Self Care | Payer: Self-pay | Source: Ambulatory Visit | Attending: Interventional Cardiology

## 2012-01-03 ENCOUNTER — Inpatient Hospital Stay (HOSPITAL_COMMUNITY): Payer: Medicare Other | Admitting: Anesthesiology

## 2012-01-03 HISTORY — PX: CORONARY ARTERY BYPASS GRAFT: SHX141

## 2012-01-03 LAB — POCT I-STAT 3, ART BLOOD GAS (G3+)
Acid-base deficit: 1 mmol/L (ref 0.0–2.0)
Acid-base deficit: 2 mmol/L (ref 0.0–2.0)
Acid-base deficit: 3 mmol/L — ABNORMAL HIGH (ref 0.0–2.0)
Bicarbonate: 25.1 mEq/L — ABNORMAL HIGH (ref 20.0–24.0)
Bicarbonate: 21.4 mEq/L (ref 20.0–24.0)
O2 Saturation: 100 %
O2 Saturation: 97 %
O2 Saturation: 95 %
TCO2: 27 mmol/L (ref 0–100)
TCO2: 24 mmol/L (ref 0–100)
TCO2: 23 mmol/L (ref 0–100)
pCO2 arterial: 37.9 mmHg (ref 35.0–45.0)
pCO2 arterial: 38.1 mmHg (ref 35.0–45.0)
pO2, Arterial: 90 mmHg (ref 80.0–100.0)
pO2, Arterial: 110 mmHg — ABNORMAL HIGH (ref 80.0–100.0)

## 2012-01-03 LAB — CBC
HCT: 24 % — ABNORMAL LOW (ref 39.0–52.0)
Hemoglobin: 8.3 g/dL — ABNORMAL LOW (ref 13.0–17.0)
MCH: 33.7 pg (ref 26.0–34.0)
MCHC: 34.7 g/dL (ref 30.0–36.0)
MCV: 96.4 fL (ref 78.0–100.0)
MCV: 97.2 fL (ref 78.0–100.0)
Platelets: 84 10*3/uL — ABNORMAL LOW (ref 150–400)
RBC: 2.49 MIL/uL — ABNORMAL LOW (ref 4.22–5.81)
RBC: 2.46 MIL/uL — ABNORMAL LOW (ref 4.22–5.81)
WBC: 10.7 10*3/uL — ABNORMAL HIGH (ref 4.0–10.5)

## 2012-01-03 LAB — HEMOGLOBIN AND HEMATOCRIT, BLOOD
HCT: 23.1 % — ABNORMAL LOW (ref 39.0–52.0)
Hemoglobin: 8 g/dL — ABNORMAL LOW (ref 13.0–17.0)

## 2012-01-03 LAB — POCT I-STAT, CHEM 8
BUN: 13 mg/dL (ref 6–23)
Chloride: 106 mEq/L (ref 96–112)
Creatinine, Ser: 1 mg/dL (ref 0.50–1.35)
Glucose, Bld: 142 mg/dL — ABNORMAL HIGH (ref 70–99)
HCT: 24 % — ABNORMAL LOW (ref 39.0–52.0)
Potassium: 3.7 mEq/L (ref 3.5–5.1)

## 2012-01-03 LAB — POCT I-STAT 4, (NA,K, GLUC, HGB,HCT)
Glucose, Bld: 120 mg/dL — ABNORMAL HIGH (ref 70–99)
Glucose, Bld: 135 mg/dL — ABNORMAL HIGH (ref 70–99)
Glucose, Bld: 101 mg/dL — ABNORMAL HIGH (ref 70–99)
HCT: 33 % — ABNORMAL LOW (ref 39.0–52.0)
HCT: 25 % — ABNORMAL LOW (ref 39.0–52.0)
Hemoglobin: 11.2 g/dL — ABNORMAL LOW (ref 13.0–17.0)
Hemoglobin: 7.8 g/dL — ABNORMAL LOW (ref 13.0–17.0)
Potassium: 3.7 mEq/L (ref 3.5–5.1)
Potassium: 4.5 mEq/L (ref 3.5–5.1)
Sodium: 141 mEq/L (ref 135–145)
Sodium: 138 mEq/L (ref 135–145)
Sodium: 140 mEq/L (ref 135–145)

## 2012-01-03 LAB — APTT: aPTT: 36 seconds (ref 24–37)

## 2012-01-03 LAB — PLATELET COUNT: Platelets: 112 10*3/uL — ABNORMAL LOW (ref 150–400)

## 2012-01-03 LAB — GLUCOSE, CAPILLARY

## 2012-01-03 LAB — CREATININE, SERUM: GFR calc Af Amer: >90 mL/min (ref >90)

## 2012-01-03 SURGERY — REDO CORONARY ARTERY BYPASS GRAFTING (CABG)
Anesthesia: General | Site: Chest

## 2012-01-03 MED ORDER — POTASSIUM CHLORIDE 10 MEQ/50ML IV SOLN
10.0000 meq | INTRAVENOUS | Status: AC
  Administered 2012-01-03 (×3): 10 meq via INTRAVENOUS

## 2012-01-03 MED ORDER — HEMOSTATIC AGENTS (NO CHARGE) OPTIME
TOPICAL | Status: DC | PRN
  Administered 2012-01-03: 1 via TOPICAL

## 2012-01-03 MED ORDER — MAGNESIUM SULFATE 40 MG/ML IJ SOLN
4.0000 g | Freq: Once | INTRAMUSCULAR | Status: AC
  Administered 2012-01-03: 4 g via INTRAVENOUS
  Filled 2012-01-03: qty 100

## 2012-01-03 MED ORDER — 0.9 % SODIUM CHLORIDE (POUR BTL) OPTIME
TOPICAL | Status: DC | PRN
  Administered 2012-01-03: 6000 mL

## 2012-01-03 MED ORDER — TAMSULOSIN HCL 0.4 MG PO CAPS
0.4000 mg | ORAL_CAPSULE | Freq: Every day | ORAL | Status: DC
  Administered 2012-01-04 – 2012-01-09 (×6): 0.4 mg via ORAL
  Filled 2012-01-03 (×6): qty 1

## 2012-01-03 MED ORDER — LIDOCAINE HCL (CARDIAC) 20 MG/ML IV SOLN
INTRAVENOUS | Status: DC | PRN
  Administered 2012-01-03: 100 mg via INTRAVENOUS

## 2012-01-03 MED ORDER — BISACODYL 10 MG RE SUPP: 10.0000 mg | Freq: Every day | RECTAL | Status: DC

## 2012-01-03 MED ORDER — THROMBIN 20000 UNITS EX SOLR
CUTANEOUS | Status: AC
  Filled 2012-01-03: qty 20000

## 2012-01-03 MED ORDER — VANCOMYCIN HCL IN DEXTROSE 1-5 GM/200ML-% IV SOLN
1000.0000 mg | Freq: Once | INTRAVENOUS | Status: AC
  Administered 2012-01-03: 1000 mg via INTRAVENOUS
  Filled 2012-01-03: qty 200

## 2012-01-03 MED ORDER — INSULIN REGULAR BOLUS VIA INFUSION
0.0000 [IU] | Freq: Three times a day (TID) | INTRAVENOUS | Status: DC
  Filled 2012-01-03: qty 10

## 2012-01-03 MED ORDER — SODIUM CHLORIDE 0.9 % IV SOLN
0.1000 ug/kg/h | INTRAVENOUS | Status: DC
  Administered 2012-01-03: 0.7 ug/kg/h via INTRAVENOUS
  Filled 2012-01-03 (×2): qty 2

## 2012-01-03 MED ORDER — ACETAMINOPHEN 160 MG/5ML PO SOLN
975.0000 mg | Freq: Four times a day (QID) | ORAL | Status: DC
  Filled 2012-01-03: qty 40.6

## 2012-01-03 MED ORDER — MORPHINE SULFATE 2 MG/ML IJ SOLN
2.0000 mg | INTRAMUSCULAR | Status: DC | PRN
  Administered 2012-01-03 – 2012-01-04 (×7): 2 mg via INTRAVENOUS
  Filled 2012-01-03: qty 1
  Filled 2012-01-03: qty 2
  Filled 2012-01-03 (×6): qty 1

## 2012-01-03 MED ORDER — ONDANSETRON HCL 4 MG/2ML IJ SOLN: 4.0000 mg | Freq: Four times a day (QID) | INTRAMUSCULAR | Status: DC | PRN

## 2012-01-03 MED ORDER — METOPROLOL TARTRATE 25 MG/10 ML ORAL SUSPENSION
12.5000 mg | Freq: Two times a day (BID) | ORAL | Status: DC
  Administered 2012-01-03: 12.5 mg
  Filled 2012-01-03 (×3): qty 5

## 2012-01-03 MED ORDER — MIDAZOLAM HCL 5 MG/5ML IJ SOLN
INTRAMUSCULAR | Status: DC | PRN
  Administered 2012-01-03: 6 mg via INTRAVENOUS
  Administered 2012-01-03: 4 mg via INTRAVENOUS

## 2012-01-03 MED ORDER — TRANEXAMIC ACID 100 MG/ML IV SOLN
2500.0000 mg | INTRAVENOUS | Status: DC | PRN
  Administered 2012-01-03: 1.5 mg/kg/h via INTRAVENOUS

## 2012-01-03 MED ORDER — PHENYLEPHRINE HCL 10 MG/ML IJ SOLN
0.0000 ug/min | INTRAVENOUS | Status: DC
  Filled 2012-01-03: qty 2

## 2012-01-03 MED ORDER — SODIUM CHLORIDE 0.9 % IV SOLN
INTRAVENOUS | Status: DC
  Administered 2012-01-03: 0.3 [IU]/h via INTRAVENOUS
  Administered 2012-01-03: 1.2 [IU]/h via INTRAVENOUS
  Administered 2012-01-03: 0.4 [IU]/h via INTRAVENOUS
  Filled 2012-01-03: qty 1

## 2012-01-03 MED ORDER — NITROPRUSSIDE SODIUM 25 MG/ML IV SOLN
0.2500 ug/kg/min | INTRAVENOUS | Status: DC
  Administered 2012-01-03: 0.3 ug/kg/min via INTRAVENOUS
  Filled 2012-01-03: qty 2

## 2012-01-03 MED ORDER — DEXTROSE 5 % IV SOLN
1.5000 g | Freq: Two times a day (BID) | INTRAVENOUS | Status: AC
  Administered 2012-01-03 – 2012-01-05 (×4): 1.5 g via INTRAVENOUS
  Filled 2012-01-03 (×4): qty 1.5

## 2012-01-03 MED ORDER — MIDAZOLAM HCL 2 MG/2ML IJ SOLN
2.0000 mg | INTRAMUSCULAR | Status: DC | PRN
  Filled 2012-01-03: qty 2

## 2012-01-03 MED ORDER — ALBUMIN HUMAN 5 % IV SOLN
INTRAVENOUS | Status: DC | PRN
  Administered 2012-01-03: 13:00:00 via INTRAVENOUS

## 2012-01-03 MED ORDER — ASPIRIN 81 MG PO CHEW: 324.0000 mg | CHEWABLE_TABLET | Freq: Every day | ORAL | Status: DC

## 2012-01-03 MED ORDER — ASPIRIN EC 325 MG PO TBEC
325.0000 mg | DELAYED_RELEASE_TABLET | Freq: Every day | ORAL | Status: DC
  Administered 2012-01-04 – 2012-01-06 (×3): 325 mg via ORAL
  Filled 2012-01-03 (×3): qty 1

## 2012-01-03 MED ORDER — MORPHINE SULFATE 2 MG/ML IJ SOLN
1.0000 mg | INTRAMUSCULAR | Status: AC | PRN
  Administered 2012-01-03: 4 mg via INTRAVENOUS

## 2012-01-03 MED ORDER — PROTAMINE SULFATE 10 MG/ML IV SOLN
INTRAVENOUS | Status: DC | PRN
  Administered 2012-01-03: 300 mg via INTRAVENOUS

## 2012-01-03 MED ORDER — OXYCODONE HCL 5 MG PO TABS
5.0000 mg | ORAL_TABLET | ORAL | Status: DC | PRN
  Administered 2012-01-04: 10 mg via ORAL
  Filled 2012-01-03: qty 2

## 2012-01-03 MED ORDER — METOPROLOL TARTRATE 12.5 MG HALF TABLET
12.5000 mg | ORAL_TABLET | Freq: Two times a day (BID) | ORAL | Status: DC
  Filled 2012-01-03 (×3): qty 1

## 2012-01-03 MED ORDER — ACETAMINOPHEN 160 MG/5ML PO SOLN: 650.0000 mg | ORAL | Status: AC

## 2012-01-03 MED ORDER — FENTANYL CITRATE 0.05 MG/ML IJ SOLN
INTRAMUSCULAR | Status: DC | PRN
  Administered 2012-01-03: 500 ug via INTRAVENOUS
  Administered 2012-01-03: 100 ug via INTRAVENOUS
  Administered 2012-01-03: 500 ug via INTRAVENOUS
  Administered 2012-01-03: 250 ug via INTRAVENOUS

## 2012-01-03 MED ORDER — METOPROLOL TARTRATE 1 MG/ML IV SOLN
2.5000 mg | INTRAVENOUS | Status: DC | PRN
  Administered 2012-01-03: 5 mg via INTRAVENOUS
  Administered 2012-01-03 (×2): 1 mg via INTRAVENOUS
  Administered 2012-01-04: 2.5 mg via INTRAVENOUS
  Administered 2012-01-04: 5 mg via INTRAVENOUS
  Administered 2012-01-05 – 2012-01-06 (×2): 2.5 mg via INTRAVENOUS
  Filled 2012-01-03 (×6): qty 5

## 2012-01-03 MED ORDER — THROMBIN 20000 UNITS EX KIT
PACK | OROMUCOSAL | Status: DC | PRN
  Administered 2012-01-03: 09:00:00 via TOPICAL

## 2012-01-03 MED ORDER — DOCUSATE SODIUM 100 MG PO CAPS
200.0000 mg | ORAL_CAPSULE | Freq: Every day | ORAL | Status: DC
  Administered 2012-01-04 – 2012-01-06 (×3): 200 mg via ORAL

## 2012-01-03 MED ORDER — PANTOPRAZOLE SODIUM 40 MG PO TBEC
40.0000 mg | DELAYED_RELEASE_TABLET | Freq: Every day | ORAL | Status: DC
  Administered 2012-01-05: 40 mg via ORAL
  Filled 2012-01-03: qty 1

## 2012-01-03 MED ORDER — BISACODYL 5 MG PO TBEC
10.0000 mg | DELAYED_RELEASE_TABLET | Freq: Every day | ORAL | Status: DC
  Administered 2012-01-04 – 2012-01-06 (×3): 10 mg via ORAL
  Filled 2012-01-03 (×3): qty 2

## 2012-01-03 MED ORDER — SODIUM CHLORIDE 0.9 % IV SOLN: 250.0000 mL | INTRAVENOUS | Status: DC

## 2012-01-03 MED ORDER — THROMBIN 20000 UNITS EX KIT
PACK | CUTANEOUS | Status: AC
  Filled 2012-01-03: qty 1

## 2012-01-03 MED ORDER — ROCURONIUM BROMIDE 100 MG/10ML IV SOLN
INTRAVENOUS | Status: DC | PRN
  Administered 2012-01-03: 30 mg via INTRAVENOUS
  Administered 2012-01-03: 20 mg via INTRAVENOUS
  Administered 2012-01-03: 50 mg via INTRAVENOUS
  Administered 2012-01-03: 100 mg via INTRAVENOUS

## 2012-01-03 MED ORDER — POTASSIUM CHLORIDE 10 MEQ/50ML IV SOLN
10.0000 meq | INTRAVENOUS | Status: AC | PRN
  Administered 2012-01-03 (×3): 10 meq via INTRAVENOUS

## 2012-01-03 MED ORDER — SODIUM CHLORIDE 0.9 % IJ SOLN
3.0000 mL | Freq: Two times a day (BID) | INTRAMUSCULAR | Status: DC
  Administered 2012-01-04 – 2012-01-06 (×4): 3 mL via INTRAVENOUS

## 2012-01-03 MED ORDER — SODIUM CHLORIDE 0.45 % IV SOLN
INTRAVENOUS | Status: DC
  Administered 2012-01-03: 20 mL via INTRAVENOUS

## 2012-01-03 MED ORDER — LACTATED RINGERS IV SOLN: 500.0000 mL | Freq: Once | INTRAVENOUS | Status: AC | PRN

## 2012-01-03 MED ORDER — NITROGLYCERIN IN D5W 200-5 MCG/ML-% IV SOLN
0.0000 ug/min | INTRAVENOUS | Status: DC
  Administered 2012-01-03: 10 ug/min via INTRAVENOUS
  Administered 2012-01-04: 50 ug/min via INTRAVENOUS
  Filled 2012-01-03: qty 250

## 2012-01-03 MED ORDER — ACETAMINOPHEN 500 MG PO TABS
1000.0000 mg | ORAL_TABLET | Freq: Four times a day (QID) | ORAL | Status: DC
  Administered 2012-01-04 – 2012-01-06 (×9): 1000 mg via ORAL
  Filled 2012-01-03 (×14): qty 2

## 2012-01-03 MED ORDER — HEPARIN SODIUM (PORCINE) 1000 UNIT/ML IJ SOLN
INTRAMUSCULAR | Status: DC | PRN
  Administered 2012-01-03: 40000 [IU] via INTRAVENOUS

## 2012-01-03 MED ORDER — SODIUM CHLORIDE 0.9 % IV SOLN
INTRAVENOUS | Status: DC
  Administered 2012-01-03: 10 mL via INTRAVENOUS

## 2012-01-03 MED ORDER — SODIUM CHLORIDE 0.9 % IJ SOLN: 3.0000 mL | INTRAMUSCULAR | Status: DC | PRN

## 2012-01-03 MED ORDER — MEPERIDINE HCL 50 MG/ML IJ SOLN
12.5000 mg | Freq: Once | INTRAMUSCULAR | Status: AC
  Administered 2012-01-03: 12.5 mg via INTRAVENOUS
  Filled 2012-01-03: qty 1

## 2012-01-03 MED ORDER — FAMOTIDINE IN NACL 20-0.9 MG/50ML-% IV SOLN
20.0000 mg | Freq: Two times a day (BID) | INTRAVENOUS | Status: DC
  Administered 2012-01-03: 20 mg via INTRAVENOUS

## 2012-01-03 MED ORDER — ALBUMIN HUMAN 5 % IV SOLN
250.0000 mL | INTRAVENOUS | Status: AC | PRN
  Administered 2012-01-03 (×2): 250 mL via INTRAVENOUS

## 2012-01-03 MED ORDER — PROPOFOL 10 MG/ML IV BOLUS
INTRAVENOUS | Status: DC | PRN
  Administered 2012-01-03: 80 mg via INTRAVENOUS

## 2012-01-03 MED ORDER — LACTATED RINGERS IV SOLN
INTRAVENOUS | Status: DC | PRN
  Administered 2012-01-03 (×4): via INTRAVENOUS

## 2012-01-03 MED ORDER — ACETAMINOPHEN 650 MG RE SUPP
650.0000 mg | RECTAL | Status: AC
  Administered 2012-01-03: 650 mg via RECTAL

## 2012-01-03 MED ORDER — LACTATED RINGERS IV SOLN
INTRAVENOUS | Status: DC
  Administered 2012-01-03: 20 mL via INTRAVENOUS

## 2012-01-03 MED ORDER — THROMBIN 20000 UNITS EX KIT
PACK | CUTANEOUS | Status: DC | PRN
  Administered 2012-01-03: 20000 [IU] via TOPICAL

## 2012-01-03 SURGICAL SUPPLY — 113 items
ADAPTER CARDIO PERF ANTE/RETRO (ADAPTER) IMPLANT
ATTRACTOMAT 16X20 MAGNETIC DRP (DRAPES) ×2 IMPLANT
BAG DECANTER FOR FLEXI CONT (MISCELLANEOUS) ×2 IMPLANT
BANDAGE ELASTIC 4 VELCRO ST LF (GAUZE/BANDAGES/DRESSINGS) ×4 IMPLANT
BANDAGE ELASTIC 6 VELCRO ST LF (GAUZE/BANDAGES/DRESSINGS) ×4 IMPLANT
BANDAGE GAUZE ELAST BULKY 4 IN (GAUZE/BANDAGES/DRESSINGS) ×4 IMPLANT
BLADE CORE FAN STRYKER (BLADE) ×2 IMPLANT
BLADE OSCILLATING /SAGITTAL (BLADE) ×2 IMPLANT
BLADE STERNUM SYSTEM 6 (BLADE) ×2 IMPLANT
BLADE SURG 11 STRL SS (BLADE) ×2 IMPLANT
BLADE SURG ROTATE 9660 (MISCELLANEOUS) IMPLANT
CANISTER SUCTION 2500CC (MISCELLANEOUS) ×2 IMPLANT
CANNULA GUNDRY RCSP 15FR (MISCELLANEOUS) IMPLANT
CATH ROBINSON RED A/P 18FR (CATHETERS) ×4 IMPLANT
CATH THORACIC 28FR (CATHETERS) ×2 IMPLANT
CATH THORACIC 28FR RT ANG (CATHETERS) IMPLANT
CATH THORACIC 36FR (CATHETERS) ×2 IMPLANT
CATH THORACIC 36FR RT ANG (CATHETERS) ×2 IMPLANT
CLIP FOGARTY SPRING 6M (CLIP) IMPLANT
CLIP TI MEDIUM 24 (CLIP) IMPLANT
CLIP TI WIDE RED SMALL 24 (CLIP) ×10 IMPLANT
CLOTH BEACON ORANGE TIMEOUT ST (SAFETY) ×2 IMPLANT
COVER SURGICAL LIGHT HANDLE (MISCELLANEOUS) ×4 IMPLANT
CRADLE DONUT ADULT HEAD (MISCELLANEOUS) ×2 IMPLANT
DERMABOND ADVANCED (GAUZE/BANDAGES/DRESSINGS) ×1
DERMABOND ADVANCED .7 DNX12 (GAUZE/BANDAGES/DRESSINGS) ×1 IMPLANT
DRAPE CARDIOVASCULAR INCISE (DRAPES) ×1
DRAPE SLUSH MACHINE 52X66 (DRAPES) IMPLANT
DRAPE SLUSH/WARMER DISC (DRAPES) IMPLANT
DRAPE SRG 135X102X78XABS (DRAPES) ×1 IMPLANT
DRSG COVADERM 4X14 (GAUZE/BANDAGES/DRESSINGS) ×2 IMPLANT
ELECT CAUTERY BLADE 6.4 (BLADE) ×4 IMPLANT
ELECT REM PT RETURN 9FT ADLT (ELECTROSURGICAL) ×4
ELECTRODE REM PT RTRN 9FT ADLT (ELECTROSURGICAL) ×2 IMPLANT
GLOVE BIO SURGEON STRL SZ 6 (GLOVE) ×6 IMPLANT
GLOVE BIO SURGEON STRL SZ 6.5 (GLOVE) ×10 IMPLANT
GLOVE BIO SURGEON STRL SZ7 (GLOVE) IMPLANT
GLOVE BIO SURGEON STRL SZ7.5 (GLOVE) IMPLANT
GLOVE BIOGEL PI IND STRL 6 (GLOVE) IMPLANT
GLOVE BIOGEL PI IND STRL 6.5 (GLOVE) IMPLANT
GLOVE BIOGEL PI IND STRL 7.0 (GLOVE) IMPLANT
GLOVE BIOGEL PI INDICATOR 6 (GLOVE)
GLOVE BIOGEL PI INDICATOR 6.5 (GLOVE)
GLOVE BIOGEL PI INDICATOR 7.0 (GLOVE)
GLOVE EUDERMIC 7 POWDERFREE (GLOVE) ×4 IMPLANT
GLOVE ORTHO TXT STRL SZ7.5 (GLOVE) IMPLANT
GOWN PREVENTION PLUS XLARGE (GOWN DISPOSABLE) ×2 IMPLANT
GOWN STRL NON-REIN LRG LVL3 (GOWN DISPOSABLE) ×14 IMPLANT
HEMOSTAT POWDER SURGIFOAM 1G (HEMOSTASIS) ×4 IMPLANT
HEMOSTAT SURGICEL 2X14 (HEMOSTASIS) ×2 IMPLANT
INSERT FOGARTY 61MM (MISCELLANEOUS) IMPLANT
INSERT FOGARTY XLG (MISCELLANEOUS) IMPLANT
KIT BASIN OR (CUSTOM PROCEDURE TRAY) ×2 IMPLANT
KIT CATH CPB BARTLE (MISCELLANEOUS) ×2 IMPLANT
KIT ROOM TURNOVER OR (KITS) ×2 IMPLANT
KIT SUCTION CATH 14FR (SUCTIONS) ×2 IMPLANT
KIT VASOVIEW W/TROCAR VH 2000 (KITS) ×2 IMPLANT
NS IRRIG 1000ML POUR BTL (IV SOLUTION) ×10 IMPLANT
PACK OPEN HEART (CUSTOM PROCEDURE TRAY) ×2 IMPLANT
PAD ARMBOARD 7.5X6 YLW CONV (MISCELLANEOUS) ×4 IMPLANT
PAD DEFIB R2 (MISCELLANEOUS) ×2 IMPLANT
PENCIL BUTTON HOLSTER BLD 10FT (ELECTRODE) ×2 IMPLANT
PUNCH AORTIC ROTATE 4.0MM (MISCELLANEOUS) ×2 IMPLANT
PUNCH AORTIC ROTATE 4.5MM 8IN (MISCELLANEOUS) ×2 IMPLANT
PUNCH AORTIC ROTATE 5MM 8IN (MISCELLANEOUS) IMPLANT
SOLUTION ANTI FOG 6CC (MISCELLANEOUS) IMPLANT
SPONGE GAUZE 4X4 12PLY (GAUZE/BANDAGES/DRESSINGS) ×6 IMPLANT
SPONGE INTESTINAL PEANUT (DISPOSABLE) IMPLANT
SPONGE LAP 18X18 X RAY DECT (DISPOSABLE) ×2 IMPLANT
SPONGE LAP 4X18 X RAY DECT (DISPOSABLE) ×2 IMPLANT
SUT BONE WAX W31G (SUTURE) ×2 IMPLANT
SUT MNCRL AB 4-0 PS2 18 (SUTURE) IMPLANT
SUT PROLENE 3 0 SH 1 (SUTURE) ×2 IMPLANT
SUT PROLENE 3 0 SH DA (SUTURE) IMPLANT
SUT PROLENE 3 0 SH1 36 (SUTURE) IMPLANT
SUT PROLENE 4 0 RB 1 (SUTURE)
SUT PROLENE 4 0 SH DA (SUTURE) IMPLANT
SUT PROLENE 4-0 RB1 .5 CRCL 36 (SUTURE) IMPLANT
SUT PROLENE 5 0 C 1 36 (SUTURE) IMPLANT
SUT PROLENE 6 0 C 1 30 (SUTURE) IMPLANT
SUT PROLENE 6 0 CC (SUTURE) IMPLANT
SUT PROLENE 7 0 BV 1 (SUTURE) IMPLANT
SUT PROLENE 7 0 BV1 MDA (SUTURE) ×2 IMPLANT
SUT PROLENE 7.0 RB 3 (SUTURE) IMPLANT
SUT PROLENE 8 0 BV175 6 (SUTURE) ×4 IMPLANT
SUT PROLENE BLUE 7 0 (SUTURE) IMPLANT
SUT PROLENE POLY MONO (SUTURE) IMPLANT
SUT SILK  1 MH (SUTURE) ×1
SUT SILK 1 MH (SUTURE) ×1 IMPLANT
SUT SILK 2 0 SH CR/8 (SUTURE) ×2 IMPLANT
SUT SILK 3 0 SH CR/8 (SUTURE) IMPLANT
SUT STEEL 6MS V (SUTURE) IMPLANT
SUT STEEL STERNAL CCS#1 18IN (SUTURE) IMPLANT
SUT STEEL SZ 6 DBL 3X14 BALL (SUTURE) IMPLANT
SUT VIC AB 1 CTX 36 (SUTURE) ×4
SUT VIC AB 1 CTX36XBRD ANBCTR (SUTURE) ×2 IMPLANT
SUT VIC AB 2-0 CT1 27 (SUTURE) ×2
SUT VIC AB 2-0 CT1 TAPERPNT 27 (SUTURE) ×1 IMPLANT
SUT VIC AB 2-0 CTX 27 (SUTURE) IMPLANT
SUT VIC AB 3-0 SH 27 (SUTURE)
SUT VIC AB 3-0 SH 27X BRD (SUTURE) IMPLANT
SUT VIC AB 3-0 X1 27 (SUTURE) IMPLANT
SUT VICRYL 4-0 PS2 18IN ABS (SUTURE) ×2 IMPLANT
SUTURE E-PAK OPEN HEART (SUTURE) ×2 IMPLANT
SYSTEM SAHARA CHEST DRAIN ATS (WOUND CARE) ×2 IMPLANT
TOWEL OR 17X24 6PK STRL BLUE (TOWEL DISPOSABLE) ×2 IMPLANT
TOWEL OR 17X26 10 PK STRL BLUE (TOWEL DISPOSABLE) ×2 IMPLANT
TRAY FOLEY IC TEMP SENS 14FR (CATHETERS) ×2 IMPLANT
TUBE SUCT INTRACARD DLP 20F (MISCELLANEOUS) ×2 IMPLANT
TUBING INSUFFLATION 10FT LAP (TUBING) ×2 IMPLANT
UNDERPAD 30X30 INCONTINENT (UNDERPADS AND DIAPERS) ×2 IMPLANT
WATER STERILE IRR 1000ML POUR (IV SOLUTION) ×4 IMPLANT
YANKAUER SUCT BULB TIP NO VENT (SUCTIONS) ×2 IMPLANT

## 2012-01-03 NOTE — CV Procedure (Signed)
TCTS BRIEF PROGRESS NOTE   Called to see patient because of bleeding around left IJ sheath insertion site Single silk mattress suture placed circumferentially around sheath under sterile conditions  [name] H 01/03/2012 9:02 PM

## 2012-01-03 NOTE — Progress Notes (Signed)
Patient's systolic blood pressure 200 with a MAP of 100 according to arterial line.  Cuff pressure 168 systolic with a MAP of 102.  Patient currently receiving maximum ordered dose of Nitroglycerin.  Dr. Laneta Simmers notified and Nipride ordered and started per order.  Will continue to monitor at bedside at titrate medications accordingly.

## 2012-01-03 NOTE — Progress Notes (Signed)
Patient's blood pressures continue to remain labile.  Continuing to remain at bedside and titrate drips accordingly.

## 2012-01-03 NOTE — Anesthesia Postprocedure Evaluation (Signed)
  Anesthesia Post-op Note  Patient: [name]  Procedure(s) Performed: Procedure(s) (LRB): REDO CORONARY ARTERY BYPASS GRAFTING (CABG) (N/A)  Patient Location: SICU  Anesthesia Type: General  Level of Consciousness: sedated  Airway and Oxygen Therapy: Patient remains intubated per anesthesia plan and Patient placed on Ventilator (see vital sign flow sheet for setting)  Post-op Pain: none  Post-op Assessment: Post-op Vital signs reviewed, Patient's Cardiovascular Status Stable, Respiratory Function Stable, Patent Airway and No signs of Nausea or vomiting  Post-op Vital Signs: Reviewed and stable  Complications: No apparent anesthesia complications

## 2012-01-03 NOTE — Transfer of Care (Signed)
Immediate Anesthesia Transfer of Care Note  Patient: [name]  Procedure(s) Performed: Procedure(s) (LRB): REDO CORONARY ARTERY BYPASS GRAFTING (CABG) (N/A)  Patient Location: SICU  Anesthesia Type: General  Level of Consciousness: sedated  Airway & Oxygen Therapy: Patient remains intubated per anesthesia plan and Patient placed on Ventilator (see vital sign flow sheet for setting)  Post-op Assessment: Post -op Vital signs reviewed and stable  Post vital signs: Reviewed and stable  Complications: No apparent anesthesia complications

## 2012-01-03 NOTE — Preoperative (Signed)
Beta Blockers   Reason not to administer Beta Blockers:Not Applicable 

## 2012-01-03 NOTE — Brief Op Note (Signed)
12/24/2011 - 01/03/2012  12:08 PM  PATIENT:  [name]  76 y.o. male  PRE-OPERATIVE DIAGNOSIS: 1. History of CAD (s/p CABG x 1 in 1994) 2.Multivessel CAD  POST-OPERATIVE DIAGNOSIS:  1. History of CAD (s/p CABG x 1 1994) 2.Multivessel CAD  PROCEDURE: REDO Median Sternotomy for CORONARY ARTERY BYPASS GRAFTING (CABG) x 4 (LIMA to LAD, SVG to OM1, SVG to OM2, and free RIMA to PDA) with EVH from bilateral thighs.  Please note that right saphenous vein from the thigh was too small to use.  SURGEON:  Surgeon(s) and Role:    * Alleen Borne, MD - Primary  PHYSICIAN ASSISTANT: Doree Fudge PA-C   ANESTHESIA:   general  EBL:  Total I/O In: 2000 [I.V.:2000] Out: 300 [Urine:300]   DRAINS:  Chest Tube(s) in the Mediastinal and Pleural spaces    COUNTS CORRECT:  YES  DICTATION: .Dragon Dictation  PLAN OF CARE: Admit to inpatient   PATIENT DISPOSITION:  ICU - intubated and hemodynamically stable.   Delay start of Pharmacological VTE agent (>24hrs) due to surgical blood loss or risk of bleeding: yes  PRE OP WEIGHT: 69.4 kg

## 2012-01-03 NOTE — Progress Notes (Signed)
Day of Surgery Procedure(s) (LRB): REDO CORONARY ARTERY BYPASS GRAFTING (CABG) (N/A) Subjective: No chest pain over the weeknd  Objective: Vital signs in last 24 hours: Temp:  [97.9 F (36.6 C)-98.7 F (37.1 C)] 98.7 F (37.1 C) (06/24 0422) Pulse Rate:  [59-60] 59  (06/24 0422) Cardiac Rhythm:  [-] Atrial paced (06/23 2015) Resp:  [16-18] 18  (06/24 0422) BP: (119-144)/(62-75) 144/75 mmHg (06/24 0422) SpO2:  [97 %-100 %] 99 % (06/24 0422)  Hemodynamic parameters for last 24 hours:    Intake/Output from previous day: 06/23 0701 - 06/24 0700 In: 720 [P.O.:720] Out: 2975 [Urine:2975] Intake/Output this shift:    Lab Results:  Basename 01/02/12 0548 01/01/12 0605  WBC 6.7 6.4  HGB 10.0* 9.6*  HCT 29.4* 28.0*  PLT 126* 120*   BMET:  Basename 01/02/12 0548  NA 141  K 4.3  CL 110  CO2 23  GLUCOSE 109*  BUN 14  CREATININE 1.10  CALCIUM 8.8    PT/INR:  Basename 01/02/12 0548  LABPROT 13.8  INR 1.04   ABG    Component Value Date/Time   PHART 7.428 12/25/2011 0629   HCO3 25.8* 12/25/2011 0629   TCO2 27.0 12/25/2011 0629   O2SAT 97.0 12/25/2011 0629   CBG (last 3)   Basename 01/03/12 0547 01/02/12 2153 01/02/12 1629  GLUCAP 117* 173* 104*    Assessment/Plan:  Stable for OR this am.   LOS: 10 days    Sladen Plancarte K 01/03/2012

## 2012-01-03 NOTE — OR Nursing (Signed)
13:15pm - call vol. Desk to inform family off pump; 1st call to SICU.  13:45pm - 2nd call to SICU

## 2012-01-03 NOTE — Procedures (Signed)
Extubation Procedure Note  Patient Details:   Name: [name] DOB: [birth year] MRN: [id number]   Airway Documentation:   Patient extubated to 4 lpm nasal cannula.  VC 1100 ml, NIF -25, patient able to hold head off bed 10 seconds.  Patient able to breathe around deflated cuff and vocalize post procedure.  Tolerated well, no complications noted.   Evaluation  O2 sats: stable throughout Complications: No apparent complications Patient did tolerate procedure well. Bilateral Breath Sounds: Clear   Yes  [name], Aloha Gell 01/03/2012, 9:41 PM

## 2012-01-03 NOTE — Progress Notes (Signed)
Patient now very tense and rigid with noticeable shivering.  Dr. Laneta Simmers notified and 12.5 mg Demerol given IV as ordered.

## 2012-01-03 NOTE — Progress Notes (Signed)
TCTS BRIEF SICU PROGRESS NOTE  Day of Surgery  S/P Procedure(s) (LRB): REDO CORONARY ARTERY BYPASS GRAFTING (CABG) (N/A)   Starting to wake on vent NSR BP currently stable on SNP and NTG for hypertension PA pressures low, C.I. 2.9 Chest tube output low UOP excellent Labs okay  Plan: Continue routine early postop  [name] H 01/03/2012 8:36 PM

## 2012-01-03 NOTE — Anesthesia Preprocedure Evaluation (Addendum)
Anesthesia Evaluation  Patient identified by MRN, date of birth, ID band Patient awake    Reviewed: Allergy & Precautions, H&P , NPO status , Patient's Chart, lab work & pertinent test results  Airway Mallampati: I TM Distance: >3 FB     Dental  (+) Edentulous Upper and Edentulous Lower   Pulmonary  breath sounds clear to auscultation        Cardiovascular hypertension, Pt. on medications + angina with exertion + CAD and +CHF + pacemaker Rhythm:Regular Rate:Normal     Neuro/Psych    GI/Hepatic   Endo/Other  Diabetes mellitus-, Type 2, Oral Hypoglycemic Agents  Renal/GU      Musculoskeletal   Abdominal   Peds  Hematology   Anesthesia Other Findings   Reproductive/Obstetrics                           Anesthesia Physical Anesthesia Plan  ASA: IV  Anesthesia Plan: General   Post-op Pain Management:    Induction: Intravenous  Airway Management Planned: Oral ETT  Additional Equipment: Arterial line and PA Cath  Intra-op Plan:   Post-operative Plan: Post-operative intubation/ventilation  Informed Consent: I have reviewed the patients History and Physical, chart, labs and discussed the procedure including the risks, benefits and alternatives for the proposed anesthesia with the patient or authorized representative who has indicated his/her understanding and acceptance.   Dental advisory given  Plan Discussed with: CRNA, Anesthesiologist and Surgeon  Anesthesia Plan Comments:         Anesthesia Quick Evaluation

## 2012-01-04 ENCOUNTER — Inpatient Hospital Stay (HOSPITAL_COMMUNITY): Payer: Medicare Other

## 2012-01-04 LAB — TYPE AND SCREEN
ABO/RH(D): O POS
Unit division: 0
Unit division: 0
Unit division: 0
Unit division: 0
Unit division: 0

## 2012-01-04 LAB — CBC
HCT: 23.9 % — ABNORMAL LOW (ref 39.0–52.0)
HCT: 23.7 % — ABNORMAL LOW (ref 39.0–52.0)
Hemoglobin: 8.2 g/dL — ABNORMAL LOW (ref 13.0–17.0)
MCV: 98.3 fL (ref 78.0–100.0)
RBC: 2.41 MIL/uL — ABNORMAL LOW (ref 4.22–5.81)
RDW: 15.9 % — ABNORMAL HIGH (ref 11.5–15.5)
WBC: 17.6 10*3/uL — ABNORMAL HIGH (ref 4.0–10.5)
WBC: 19.6 10*3/uL — ABNORMAL HIGH (ref 4.0–10.5)

## 2012-01-04 LAB — POCT I-STAT, CHEM 8
Calcium, Ion: 1.22 mmol/L (ref 1.12–1.32)
Chloride: 103 mEq/L (ref 96–112)
Glucose, Bld: 230 mg/dL — ABNORMAL HIGH (ref 70–99)
HCT: 23 % — ABNORMAL LOW (ref 39.0–52.0)
Hemoglobin: 7.8 g/dL — ABNORMAL LOW (ref 13.0–17.0)

## 2012-01-04 LAB — GLUCOSE, CAPILLARY
Glucose-Capillary: 79 mg/dL (ref 70–99)
Glucose-Capillary: 91 mg/dL (ref 70–99)
Glucose-Capillary: 91 mg/dL (ref 70–99)
Glucose-Capillary: 130 mg/dL — ABNORMAL HIGH (ref 70–99)
Glucose-Capillary: 80 mg/dL (ref 70–99)
Glucose-Capillary: 90 mg/dL (ref 70–99)
Glucose-Capillary: 149 mg/dL — ABNORMAL HIGH (ref 70–99)
Glucose-Capillary: 141 mg/dL — ABNORMAL HIGH (ref 70–99)
Glucose-Capillary: 189 mg/dL — ABNORMAL HIGH (ref 70–99)
Glucose-Capillary: 219 mg/dL — ABNORMAL HIGH (ref 70–99)

## 2012-01-04 LAB — BASIC METABOLIC PANEL
BUN: 15 mg/dL (ref 6–23)
CO2: 21 mEq/L (ref 19–32)
Chloride: 106 mEq/L (ref 96–112)
GFR calc Af Amer: 78 mL/min — ABNORMAL LOW (ref >90)
Glucose, Bld: 111 mg/dL — ABNORMAL HIGH (ref 70–99)
Potassium: 3.9 mEq/L (ref 3.5–5.1)

## 2012-01-04 LAB — CREATININE, SERUM: GFR calc Af Amer: 63 mL/min — ABNORMAL LOW (ref >90)

## 2012-01-04 MED ORDER — METOPROLOL TARTRATE 25 MG PO TABS
25.0000 mg | ORAL_TABLET | Freq: Two times a day (BID) | ORAL | Status: DC
  Administered 2012-01-04 – 2012-01-06 (×3): 25 mg via ORAL
  Filled 2012-01-04 (×6): qty 1

## 2012-01-04 MED ORDER — INSULIN ASPART 100 UNIT/ML ~~LOC~~ SOLN: 0.0000 [IU] | SUBCUTANEOUS | Status: DC

## 2012-01-04 MED ORDER — INSULIN ASPART 100 UNIT/ML ~~LOC~~ SOLN
0.0000 [IU] | SUBCUTANEOUS | Status: DC
  Administered 2012-01-04 (×2): 2 [IU] via SUBCUTANEOUS
  Administered 2012-01-04: 4 [IU] via SUBCUTANEOUS
  Administered 2012-01-05: 8 [IU] via SUBCUTANEOUS
  Administered 2012-01-05: 4 [IU] via SUBCUTANEOUS
  Administered 2012-01-06: 2 [IU] via SUBCUTANEOUS

## 2012-01-04 MED ORDER — CLONIDINE HCL 0.2 MG PO TABS
0.2000 mg | ORAL_TABLET | Freq: Two times a day (BID) | ORAL | Status: DC
  Administered 2012-01-04 (×2): 0.2 mg via ORAL
  Filled 2012-01-04 (×4): qty 1

## 2012-01-04 MED ORDER — INSULIN GLARGINE 100 UNIT/ML ~~LOC~~ SOLN
15.0000 [IU] | Freq: Every day | SUBCUTANEOUS | Status: DC
  Administered 2012-01-04 – 2012-01-06 (×3): 15 [IU] via SUBCUTANEOUS

## 2012-01-04 MED ORDER — BOOST / RESOURCE BREEZE PO LIQD
1.0000 | Freq: Two times a day (BID) | ORAL | Status: DC
  Administered 2012-01-04 – 2012-01-08 (×6): 1 via ORAL

## 2012-01-04 MED ORDER — POTASSIUM CHLORIDE CRYS ER 20 MEQ PO TBCR
40.0000 meq | EXTENDED_RELEASE_TABLET | Freq: Once | ORAL | Status: AC
  Administered 2012-01-04: 40 meq via ORAL
  Filled 2012-01-04: qty 2

## 2012-01-04 MED ORDER — METOPROLOL TARTRATE 25 MG/10 ML ORAL SUSPENSION
12.5000 mg | Freq: Two times a day (BID) | ORAL | Status: DC
  Administered 2012-01-05: 12.5 mg
  Filled 2012-01-04 (×6): qty 5

## 2012-01-04 MED ORDER — FUROSEMIDE 10 MG/ML IJ SOLN
40.0000 mg | Freq: Two times a day (BID) | INTRAMUSCULAR | Status: AC
  Administered 2012-01-04 (×2): 40 mg via INTRAVENOUS
  Filled 2012-01-04 (×2): qty 4

## 2012-01-04 MED ORDER — AMIODARONE HCL 200 MG PO TABS
200.0000 mg | ORAL_TABLET | Freq: Two times a day (BID) | ORAL | Status: DC
  Administered 2012-01-04 – 2012-01-06 (×5): 200 mg via ORAL
  Filled 2012-01-04 (×6): qty 1

## 2012-01-04 MED ORDER — AMLODIPINE BESYLATE 5 MG PO TABS
5.0000 mg | ORAL_TABLET | Freq: Every day | ORAL | Status: DC
  Administered 2012-01-04: 5 mg via ORAL
  Filled 2012-01-04 (×2): qty 1

## 2012-01-04 MED FILL — Fentanyl Citrate Inj 0.05 MG/ML: INTRAMUSCULAR | Qty: 2 | Status: AC

## 2012-01-04 MED FILL — Midazolam HCl Inj 2 MG/2ML (Base Equivalent): INTRAMUSCULAR | Qty: 2 | Status: AC

## 2012-01-04 NOTE — Op Note (Signed)
[name], [name]              ACCOUNT NO.:  [id number]  MEDICAL RECORD NO.:  [id number]  LOCATION:  2305                         FACILITY:  [facility]  PHYSICIAN:  Evelene Croon, M.D.     DATE OF BIRTH:  [birth year]  DATE OF PROCEDURE:  01/03/2012 DATE OF DISCHARGE:                              OPERATIVE REPORT   PREOPERATIVE DIAGNOSIS:  Left main and severe three-vessel coronary artery disease with unstable angina.  POSTOPERATIVE DIAGNOSIS:  Left main and severe three-vessel coronary artery disease with unstable angina.  OPERATIVE PROCEDURE:  Redo median sternotomy, extracorporeal circulation, redo coronary artery bypass graft surgery x4 using a left internal mammary artery graft to the left anterior descending coronary artery, with a free right internal mammary artery graft to the posterior descending branch of the right coronary artery, a saphenous vein graft sequentially to the first and second obtuse marginal branches of the left circumflex coronary artery.  Endoscopic vein harvesting from both legs.  ATTENDING SURGEON:  Evelene Croon, MD  ASSISTANT:  Doree Fudge, PA  ANESTHESIA:  General endotracheal.  CLINICAL HISTORY:  This patient is a 76 year old gentleman who underwent coronary artery bypass graft surgery x1 in 1994 by me.  He had a saphenous vein graft to the posterior descending coronary artery.  He has done well until recently when he developed unstable anginal symptoms.  He has had other problems with vascular disease including mesenteric ischemia requiring mesenteric arterial stents as well as significant lower extremity peripheral vascular disease.  Cardiac catheterization during this admission showed about 50% distal left main stenosis.  The LAD had a 50-60% eccentric proximal stenosis.  There were 2 diagonal branches.  The first diagonal was a large vessel that had about 50% ostial proximal stenosis.  The LAD was a large vessel.  The left circumflex  had 2 moderate-sized marginal branches.  There is about 90% proximal left circumflex stenosis as well as severe disease in distal left circumflex.  The second marginal had about 80% midvessel stenosis.  Left ventricular function was normal.  The patient continued to have intermittent chest pain while on heparin and nitroglycerin.  It was felt that the best option for him was redo coronary artery bypass graft surgery.  He did have mesenteric arteriogram performed which showed patent stents in the SMA and inferior mesenteric distributions. 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  I discussed the operative procedure of redo coronary artery bypass graft surgery with the patient and his family.  We discussed alternatives, benefits, and risks including, but not limited to, bleeding, blood transfusion, infection, stroke, myocardial infarction, graft failure, mesenteric ischemia, organ dysfunction, and death.  He understood all this and agreed to proceed.  OPERATIVE PROCEDURE:  The patient was taken to the operating room and placed on the table in supine position.  After induction of general endotracheal anesthesia, a Foley catheter was placed in the bladder using sterile technique.  Then, the chest, abdomen, and both lower extremities were prepped and draped in usual sterile manner.  The chest was entered through the previous median sternotomy incision.  The sternal wires  were removed and then the sternum opened using oscillating saw without difficulty.  Sternal edges were retracted with bone hooks and the sternum separated from the underlying mediastinal structures. Then, the left internal mammary artery was harvested from the chest wall as a pedicle graft.  This was a large caliber vessel with excellent blood flow through it.  At the same time, we harvested a greater saphenous vein from  the left leg using endoscopic vein harvest technique.  This vein appeared to be small in the thigh and unusable below the knee, and therefore we harvested a second segment of greater saphenous vein from the right thigh.  This vein was tiny and unusable. We had already harvested the vein from the right lower leg at his previous surgery.  Since we had limited vein, I decided to use both internal mammary artery grafts and the right internal mammary artery graft was harvested as a pedicle graft initially.  This was also a large caliber vessel with excellent blood flow through it.  Then, the patient was heparinized and the remainder of the right atrium and ascending aorta were dissected free from the pericardial adhesions. The previous vein graft was identified and avoided.  When adequate ACT was obtained, the distal ascending aorta was cannulated using a 20- French aortic cannula for arterial inflow.  Venous outflow was achieved using a two-stage venous cannula for the right atrium.  An antegrade cardioplegia and vent cannula was inserted in the aortic root.  The patient was placed on cardiopulmonary bypass and the remainder of the heart dissected from the pericardium.  The distal coronaries were identified.  The LAD was a large graftable vessel on the surface of the heart.  It was diffusely diseased throughout its entire extent.  There was 1 area in the midportion of the vessel that was soft enough to graft, although it did have some posterior plaque.  This plaque extended out to the apical portion of vessel.  The first diagonal branch was also diffusely diseased, and since I had limited vein, and decided not to graft that vessel that only had about 50% stenosis.  The two marginal vessels both moderate size vessels with diffuse segmental disease in them.  They were mostly suitable for grafting.  The right coronary artery was diffusely diseased with plaque.  This extended out into  the midportion of the posterior descending coronary artery.  Beyond the previous vein graft insertion site, the posterior descending was relatively small, and also had some plaque there extending out distally. The distal portion of the old bypass graft just before the anastomosis was soft and looked to be free of disease, and therefore I decided to place the graft directly into the hood of the old vein graft.  Then, the aorta was crossclamped and 1000 mL of cold blood antegrade cardioplegia was administered in the aortic root with quick arrest of the heart.  Systemic hypothermia to 32 degrees centigrade and topical hypothermic iced saline was used.  Temperature probe was placed in the septum and an insulating pad in the pericardium.  The first distal anastomosis was then performed to the posterior descending coronary artery.  The distal anastomosis was performed at the hood of the old vein graft which was soft.  The conduit used was the right internal mammary graft and since it had to go fairly far out on the posterior descending branch, I had to use this as a free graft.  The right mammary pedicle was therefore ligated proximally and the  stump oversewn with 2-0 silk suture ligature.  Care was used to avoid the right phrenic nerve, which was close by.  Then, the right internal mammary artery was anastomosed to the old vein graft hood in an end-to- side manner using continuous 8-0 Prolene suture.  Flow appeared excellent through the graft.  Then, another dose of cardioplegia was given.  The second distal anastomosis was performed to the first marginal branch.  The internal diameter of this vessel was about 1.6 mm.  The conduit used was a segment of greater saphenous vein and the anastomosis performed in an end-to-side manner using continuous 7-0 Prolene suture.  Flow was noted through the graft and was excellent.  I had previously dictated in the operative procedure that the 2 marginal  branches were grafted sequentially but in fact that they graft them as separate grafts since I had not veined to do both of them separately.  The vein was also fairly small to do a sequential graft.  Then, the second marginal branch was grafted using another section of saphenous vein.  The internal diameter of this vessel was about 1.75 mm.  The conduit used was the vein graft. Anastomosis was performed in an end-to-side manner using continuous 7-0 Prolene suture.  Flow was noted through the graft was excellent.  Then, another dose of cardioplegia was given.  The fourth distal anastomosis was then performed to the mid portion of the LAD.  The internal diameter here was about 2 mm.  The conduit used was a left internal mammary graft and this was brought through an opening in the left pericardium anterior phrenic nerve.  This was anastomosed to the LAD in an end-to-side manner using continuous 8-0 Prolene suture.  The pedicle was sutured to the epicardium with 6-0 Prolene sutures.  Then, another dose of cardioplegia was given, and the two proximal vein graft anastomoses were performed to the mid ascending aorta in an end-to- side manner using continuous 7-0 Prolene suture.  The aortic wall was somewhat thickened, although soft.  I did use a 4.0 punch.  The proximal anastomosis of the right internal mammary graft was performed to the old vein graft hood in an end-to-side manner using continuous 7-0 Prolene suture.  Then, the clamp was removed from the mammary pedicle.  There was rapid warming of the ventricular septum and return of spontaneous ventricular fibrillation.  The crossclamp was then removed with a time of 120 minutes, and there was spontaneous return of an internally paced rhythm.  The proximal and distal anastomoses appeared hemostatic and allowed the grafts satisfactory.  Graft markers were placed around the proximal anastomoses.  Two temporary right ventricular and right  atrial pacing wires were placed and brought out through the skin.  When the patient had rewarmed to 37 degrees centigrade, he was weaned from cardiopulmonary bypass on no inotropic agents.  Total bypass time was 140 minutes.  Cardiac function appeared excellent with cardiac output of 5 L/minute.  Protamine was given and the venous and aortic cannulae were removed without difficulty.  Hemostasis was achieved. Four chest tubes were placed with bilateral pleural tubes with two in the post pericardium and one in the anterior mediastinum.  The sternum was then reapproximated with double #6 stainless steel wires.  The fascia was closed with continuous #1 Vicryl suture.  Subcutaneous tissue was closed with continuous 2-0 Vicryl and the skin with a 3-0 Vicryl subcuticular closure.  The lower extremity vein harvest sites were closed in layers in similar  manner.  The sponge, needle, and instrument counts were correct according to scrub nurse.  Dry sterile dressings were applied over the incision and around the chest tubes, which were hooked to Pleur-Evac suction.  The patient remained hemodynamically stable and was transported to the SICU in guarded but stable condition.     Evelene Croon, M.D.     BB/MEDQ  D:  01/04/2012  T:  01/04/2012  Job:  [id number]

## 2012-01-04 NOTE — Progress Notes (Signed)
1 Day Post-Op Procedure(s) (LRB): REDO CORONARY ARTERY BYPASS GRAFTING (CABG) (N/A) Subjective: No complaints  Objective: Vital signs in last 24 hours: Temp:  [96.8 F (36 C)-99.7 F (37.6 C)] 99.6 F (37.6 C) (06/25 0735) Pulse Rate:  [69-89] 85  (06/25 0700) Cardiac Rhythm:  [-] Normal sinus rhythm (06/24 2000) Resp:  [0-23] 12  (06/25 0700) BP: (93-150)/(51-89) 137/64 mmHg (06/25 0700) SpO2:  [91 %-100 %] 100 % (06/25 0700) Arterial Line BP: (108-235)/(50-80) 188/60 mmHg (06/25 0445) FiO2 (%):  [40 %-50 %] 40 % (06/24 2103) Weight:  [76.5 kg (168 lb 10.4 oz)] 76.5 kg (168 lb 10.4 oz) (06/25 0500)  Hemodynamic parameters for last 24 hours: PAP: (13-29)/(3-21) 23/8 mmHg CO:  [3.7 L/min-6.6 L/min] 6.6 L/min CI:  [2 L/min/m2-3.6 L/min/m2] 3.6 L/min/m2  Intake/Output from previous day: 06/24 0701 - 06/25 0700 In: 5751.5 [P.O.:60; I.V.:3896.5; Blood:360; NG/GT:30; IV Piggyback:1405] Out: 5335 [Urine:3085; Emesis/NG output:150; Blood:1500; Chest Tube:600] Intake/Output this shift:    General appearance: alert and cooperative Neurologic: intact Heart: regular rate and rhythm, S1, S2 normal, no murmur, click, rub or gallop Lungs: clear to auscultation bilaterally Extremities: edema mild Wound: dressing dry  Lab Results:  Basename 01/04/12 0400 01/03/12 2031 01/03/12 2025  WBC 17.6* -- 14.5*  HGB 8.2* 8.2* --  HCT 23.9* 24.0* --  PLT 118* -- 98*   BMET:  Basename 01/04/12 0400 01/03/12 2031 01/02/12 0548  NA 137 141 --  K 3.9 3.7 --  CL 106 106 --  CO2 21 -- 23  GLUCOSE 111* 142* --  BUN 15 13 --  CREATININE 1.06 1.00 --  CALCIUM 8.2* -- 8.8    PT/INR:  Basename 01/03/12 1445  LABPROT 16.5*  INR 1.31   ABG    Component Value Date/Time   PHART 7.368 01/03/2012 2238   HCO3 21.8 01/03/2012 2238   TCO2 23 01/03/2012 2238   ACIDBASEDEF 3.0* 01/03/2012 2238   O2SAT 95.0 01/03/2012 2238   CBG (last 3)   Basename 01/04/12 0302 01/04/12 0210 01/04/12 0056  GLUCAP  90 80 116*   CXR:  Bibasilar atelectasis  ECG: NSR 84.  No acute changes. Machine reading of acute MI inaccurate.  Assessment/Plan: S/P Procedure(s) (LRB): REDO CORONARY ARTERY BYPASS GRAFTING (CABG) (N/A) HTN: start oral antihypertensives and wean nipride and ntg. Mobilize Diuresis Diabetes control d/c tubes/lines Continue foley due to urinary output monitoring See progression orders   LOS: 11 days    Amaura Authier K 01/04/2012

## 2012-01-04 NOTE — Progress Notes (Signed)
1 Day Post-Op Procedure(s) (LRB): REDO CORONARY ARTERY BYPASS GRAFTING (CABG) (N/A) Subjective: S/p redo CABGx4 OOB,walked  80 ft,feels weak PM labs stable, Hb 8.0  Vital signs in last 24 hours: Temp:  [97.3 F (36.3 C)-99.7 F (37.6 C)] 98.5 F (36.9 C) (06/25 1517) Pulse Rate:  [69-98] 74  (06/25 1700) Cardiac Rhythm:  [-] Normal sinus rhythm (06/25 1600) Resp:  [0-23] 10  (06/25 1700) BP: (97-180)/(55-89) 150/67 mmHg (06/25 1700) SpO2:  [95 %-100 %] 97 % (06/25 1700) Arterial Line BP: (108-223)/(50-112) 176/54 mmHg (06/25 1445) FiO2 (%):  [40 %-50 %] 40 % (06/24 2103) Weight:  [168 lb 10.4 oz (76.5 kg)] 168 lb 10.4 oz (76.5 kg) (06/25 0500)  Hemodynamic parameters for last 24 hours: PAP: (13-30)/(2-13) 22/5 mmHg CO:  [5 L/min-6.7 L/min] 6.7 L/min CI:  [2.6 L/min/m2-3.6 L/min/m2] 3.6 L/min/m2  Intake/Output from previous day: 06/24 0701 - 06/25 0700 In: 5751.5 [P.O.:60; I.V.:3896.5; Blood:360; NG/GT:30; IV Piggyback:1405] Out: 5335 [Urine:3085; Emesis/NG output:150; Blood:1500; Chest Tube:600] Intake/Output this shift: Total I/O In: 704.7 [P.O.:150; I.V.:500.7; IV Piggyback:54] Out: 935 [Urine:800; Chest Tube:135]  NSR Lungs clear  Lab Results:  Basename 01/04/12 1700 01/04/12 1654 01/04/12 0400  WBC 19.6* -- 17.6*  HGB 8.0* 7.8* --  HCT 23.7* 23.0* --  PLT 131* -- 118*   BMET:  Basename 01/04/12 1654 01/04/12 0400 01/02/12 0548  NA 138 137 --  K 3.9 3.9 --  CL 103 106 --  CO2 -- 21 23  GLUCOSE 230* 111* --  BUN 16 15 --  CREATININE 1.30 1.06 --  CALCIUM -- 8.2* 8.8    PT/INR:  Basename 01/03/12 1445  LABPROT 16.5*  INR 1.31   ABG    Component Value Date/Time   PHART 7.368 01/03/2012 2238   HCO3 21.8 01/03/2012 2238   TCO2 20 01/04/2012 1654   ACIDBASEDEF 3.0* 01/03/2012 2238   O2SAT 95.0 01/03/2012 2238   CBG (last 3)   Basename 01/04/12 1257 01/04/12 0737 01/04/12 0302  GLUCAP 141* 149* 90    Assessment/Plan: S/P Procedure(s) (LRB): REDO  CORONARY ARTERY BYPASS GRAFTING (CABG) (N/A) Diuresis- lasix ordered   LOS: 11 days    [name],[name] 01/04/2012

## 2012-01-04 NOTE — Progress Notes (Signed)
CBG >200 on 2000 check. He states that he just finished eating jello and the remainder of his clear liquid dinner tray a short time ago. Will give SSI coverage and recheck at 2200 to assess if he needs to be placed back on the insulin gtt tonight for better CBG management. No S/S of hyperglycemia of note, patient resting comfortably in bed. Will monitor.

## 2012-01-04 NOTE — Progress Notes (Signed)
Patient Name: [name] Date of Encounter: 01/04/2012    SUBJECTIVE:Awake. No complaints other than soreness. Able to carry on conversation without problems. SVG to OM, SVG to OM, RIMA(free) to PDA, and LIMA to LAD.  TELEMETRY:  NST/ST: Filed Vitals:   01/04/12 0735 01/04/12 0800 01/04/12 0830 01/04/12 0900  BP:  148/74 146/70 166/56  Pulse:  89 91 97  Temp: 99.6 F (37.6 C) 99.5 F (37.5 C) 99.7 F (37.6 C) 99.5 F (37.5 C)  TempSrc: Core (Comment)     Resp:  20 21 21   Height:      Weight:      SpO2:  99% 100% 98%    Intake/Output Summary (Last 24 hours) at 01/04/12 0942 Last data filed at 01/04/12 0900  Gross per 24 hour  Intake 5930.17 ml  Output   5525 ml  Net 405.17 ml    LABS: Basic Metabolic Panel:  Basename 01/04/12 0400 01/03/12 2031 01/03/12 2025 01/02/12 0548  NA 137 141 -- --  K 3.9 3.7 -- --  CL 106 106 -- --  CO2 21 -- -- 23  GLUCOSE 111* 142* -- --  BUN 15 13 -- --  CREATININE 1.06 1.00 -- --  CALCIUM 8.2* -- -- 8.8  MG 2.0 -- 2.2 --  PHOS -- -- -- --   CBC:  Basename 01/04/12 0400 01/03/12 2031 01/03/12 2025  WBC 17.6* -- 14.5*  NEUTROABS -- -- --  HGB 8.2* 8.2* --  HCT 23.9* 24.0* --  MCV 97.2 -- 97.2  PLT 118* -- 98*   ECG: Non ischemic  Radiology/Studies:  No edema and mild CE  Physical Exam: Blood pressure 166/56, pulse 97, temperature 99.5 F (37.5 C), temperature source Core (Comment), resp. rate 21, height 5\' 9"  (1.753 m), weight 76.5 kg (168 lb 10.4 oz), SpO2 98.00%. Weight change:    No murmur No rub Neuro is grossly intact  ASSESSMENT:  1. CAD and S/P CABG re-do by Dr. Laneta Simmers as outlined 2. PVD stable without abdominal complaints 3. H/o PAF   Plan:  1. Per TCTS 2. Watch rhythm as has h/o A fib and on chronic low dose Amio. We should resume to avoid problems  Signed, Lesleigh Noe 01/04/2012, 9:42 AM

## 2012-01-04 NOTE — Progress Notes (Signed)
INITIAL ADULT NUTRITION ASSESSMENT Date: 01/04/2012   Time: 11:46 AM  Reason for Assessment: Low Braden  ASSESSMENT: Male 76 y.o.  Dx: CAD, s/p redo CABG  Hx:  Past Medical History  Diagnosis Date  . Diabetes mellitus   . CAD (coronary artery disease)     s/b CABG '94  . Hypertension   . Anemia     Macrocytic  . Peripheral vascular disease   . Hyperlipidemia   . Cancer     prostate  . Ulcer   . GERD (gastroesophageal reflux disease)   . Liddle's syndrome   . Hemorrhoids   . Intestinal ischemia   . DVT (deep venous thrombosis)   . Atrial fibrillation     Paroxysmal. S/p PPM  . CHF (congestive heart failure)     Related Meds:     . acetaminophen (TYLENOL) oral liquid 160 mg/5 mL  650 mg Per Tube NOW   Or  . acetaminophen  650 mg Rectal NOW  . acetaminophen  1,000 mg Oral Q6H   Or  . acetaminophen (TYLENOL) oral liquid 160 mg/5 mL  975 mg Per Tube Q6H  . amiodarone  200 mg Oral BID  . amLODipine  5 mg Oral Daily  . aspirin EC  325 mg Oral Daily   Or  . aspirin  324 mg Per Tube Daily  . atorvastatin  80 mg Oral Daily  . bisacodyl  10 mg Oral Daily   Or  . bisacodyl  10 mg Rectal Daily  . cefUROXime (ZINACEF)  IV  1.5 g Intravenous To OR  . cefUROXime (ZINACEF)  IV  1.5 g Intravenous Q12H  . cholecalciferol  1,000 Units Oral Daily  . cloNIDine  0.2 mg Oral BID  . docusate sodium  200 mg Oral Daily  . furosemide  40 mg Intravenous BID  . insulin aspart  0-24 Units Subcutaneous Q4H  . insulin glargine  15 Units Subcutaneous Daily  . lipase/protease/amylase  1 capsule Oral TID AC  . magnesium sulfate  4 g Intravenous Once  . meperidine (DEMEROL) injection  12.5 mg Intravenous Once  . metoprolol tartrate  25 mg Oral BID   Or  . metoprolol tartrate  12.5 mg Per Tube BID  . pantoprazole  40 mg Oral Q1200  . phenylephrine (NEO-SYNEPHRINE) Adult infusion  30-200 mcg/min Intravenous To OR  . potassium chloride  10 mEq Intravenous Q1 Hr x 3  . potassium chloride   40 mEq Oral Once  . sodium chloride  3 mL Intravenous Q12H  . Tamsulosin HCl  0.4 mg Oral Daily  . vancomycin  1,000 mg Intravenous Once  . DISCONTD: amiodarone  100 mg Oral Daily  . DISCONTD: amLODipine  5 mg Oral Daily  . DISCONTD: aspirin EC  81 mg Oral Daily  . DISCONTD: cefUROXime (ZINACEF)  IV  750 mg Intravenous To OR  . DISCONTD: chlorhexidine  60 mL Topical Once  . DISCONTD: cloNIDine  0.2 mg Oral BID  . DISCONTD: diltiazem  360 mg Oral Daily  . DISCONTD: DOPamine  2-20 mcg/kg/min Intravenous To OR  . DISCONTD: epinephrine  0.5-20 mcg/min Intravenous To OR  . DISCONTD: famotidine (PEPCID) IV  20 mg Intravenous Q12H  . DISCONTD: furosemide  40 mg Oral Daily  . DISCONTD: insulin aspart  0-24 Units Subcutaneous Q2H  . DISCONTD: insulin aspart  0-24 Units Subcutaneous Q4H  . DISCONTD: insulin aspart  0-9 Units Subcutaneous TID WC  . DISCONTD: insulin regular  0-10 Units Intravenous TID  WC  . DISCONTD: magnesium sulfate  40 mEq Other To OR  . DISCONTD: metoprolol tartrate  12.5 mg Per Tube BID  . DISCONTD: metoprolol tartrate  12.5 mg Oral BID  . DISCONTD: metoprolol tartrate  25 mg Oral BID  . DISCONTD: pantoprazole  40 mg Oral Q1200  . DISCONTD: potassium chloride  80 mEq Other To OR  . DISCONTD: potassium chloride  20 mEq Oral TID  . DISCONTD: Tamsulosin HCl  0.4 mg Oral Daily  . DISCONTD: tranexamic acid  15 mg/kg Intravenous To OR  . DISCONTD: tranexamic acid  2 mg/kg Intracatheter To OR  . DISCONTD: tranexamic acid (CYKLOKAPRON) infusion ([facility])  1.5 mg/kg/hr Intravenous To OR    Ht: 5\' 9"  (175.3 cm)  Wt: 168 lb 10.4 oz (76.5 kg)  Ideal Wt: 73 kg % Ideal Wt: 105%  Usual Wt: 158 lb -- per office visit record 12/20/11 % Usual Wt: 94%  Body mass index is 24.91 kg/(m^2).  Food/Nutrition Related Hx: no triggers per admission nutrition screen  Labs:  CMP     Component Value Date/Time   NA 137 01/04/2012 0400   K 3.9 01/04/2012 0400   CL 106 01/04/2012 0400   CO2 21  01/04/2012 0400   GLUCOSE 111* 01/04/2012 0400   BUN 15 01/04/2012 0400   CREATININE 1.06 01/04/2012 0400   CALCIUM 8.2* 01/04/2012 0400   PROT 6.0 12/25/2011 0416   ALBUMIN 3.2* 12/25/2011 0416   AST 16 12/25/2011 0416   ALT 7 12/25/2011 0416   ALKPHOS 51 12/25/2011 0416   BILITOT 0.2* 12/25/2011 0416   GFRNONAA 67* 01/04/2012 0400   GFRAA 78* 01/04/2012 0400     Intake/Output Summary (Last 24 hours) at 01/04/12 1147 Last data filed at 01/04/12 0900  Gross per 24 hour  Intake 3930.17 ml  Output   5225 ml  Net -1294.83 ml    CBG (last 3)   Basename 01/04/12 0737 01/04/12 0302 01/04/12 0210  GLUCAP 149* 90 80     Diet Order: Clear Liquid  Supplements/Tube Feeding: N/A  IVF:    sodium chloride Last Rate: 20 mL/hr at 01/04/12 0700  sodium chloride Last Rate: 10 mL (01/03/12 1430)  sodium chloride   lactated ringers Last Rate: 20 mL/hr at 01/04/12 0700  nitroGLYCERIN Last Rate: 50 mcg/min (01/04/12 0900)  nitroPRUSSide Last Rate: 0.4 mcg/kg/min (01/04/12 0900)  DISCONTD: sodium chloride Last Rate: 20 mL/hr (12/31/11 1331)  DISCONTD: dexmedetomidine (PRECEDEX) IV infusion Last Rate: Stopped (01/03/12 2100)  DISCONTD: heparin Last Rate: 1,100 Units/hr (01/01/12 1230)  DISCONTD: insulin (NOVOLIN-R) infusion Last Rate: Stopped (01/04/12 0201)  DISCONTD: nitroGLYCERIN Last Rate: Stopped (01/03/12 0631)  DISCONTD: phenylephrine (NEO-SYNEPHRINE) Adult infusion     Estimated Nutritional Needs:   Kcal: 1900-2100 Protein: 95-105 gm Fluid: 1.9-2.1 L  Patient s/p redo CABG 6/24; successful extubation post-op; patient sleeping upon RD entry; states his appetite is currently poor; does not know if he's lost weight recently; prior to surgery patient was on a Carbohydrate Modified Medium Calorie diet with 100% PO intake per flowsheet records; patient at nutrition risk given poor PO intake and post-op state; RD to order supplement to maximize kcal, protein intake -- patient  amenable.  NUTRITION DIAGNOSIS: -Inadequate oral intake (NI-2.1).  Status: Ongoing  RELATED TO: poor appetite  AS EVIDENCE BY: patient report  MONITORING/EVALUATION(Goals): Goal: Oral intake with meals & supplements to meet >90% of estimated nutrition needs Monitor: PO intake, weight, labs, I/O's  EDUCATION NEEDS: -No education needs identified at this  time  INTERVENTION:  Add Nurse, adult supplement twice daily (250 kcals, 9 gm protein per 8 fl oz carton)  RD to follow for nutrition care plan  Dietitian #: [phone]  DOCUMENTATION CODES Per approved criteria  -Not Applicable    Alger Memos 01/04/2012, 11:46 AM

## 2012-01-04 NOTE — Plan of Care (Signed)
Problem: Consults Goal: Cardiac Surgery Patient Education ( See Patient Education module for education specifics.)  Outcome: Completed/Met Date Met:  01/04/12 Documented in MD note

## 2012-01-05 ENCOUNTER — Inpatient Hospital Stay (HOSPITAL_COMMUNITY): Payer: Medicare Other

## 2012-01-05 LAB — GLUCOSE, CAPILLARY
Glucose-Capillary: 193 mg/dL — ABNORMAL HIGH (ref 70–99)
Glucose-Capillary: 229 mg/dL — ABNORMAL HIGH (ref 70–99)
Glucose-Capillary: 79 mg/dL (ref 70–99)
Glucose-Capillary: 81 mg/dL (ref 70–99)
Glucose-Capillary: 91 mg/dL (ref 70–99)
Glucose-Capillary: 92 mg/dL (ref 70–99)

## 2012-01-05 LAB — BASIC METABOLIC PANEL
Calcium: 9.2 mg/dL (ref 8.4–10.5)
Creatinine, Ser: 1.4 mg/dL — ABNORMAL HIGH (ref 0.50–1.35)
GFR calc Af Amer: 56 mL/min — ABNORMAL LOW (ref >90)
GFR calc non Af Amer: 48 mL/min — ABNORMAL LOW (ref >90)
Sodium: 135 mEq/L (ref 135–145)

## 2012-01-05 LAB — CBC
MCV: 96.8 fL (ref 78.0–100.0)
Platelets: 88 10*3/uL — ABNORMAL LOW (ref 150–400)
RBC: 2.18 MIL/uL — ABNORMAL LOW (ref 4.22–5.81)
RDW: 16.1 % — ABNORMAL HIGH (ref 11.5–15.5)
WBC: 11.4 10*3/uL — ABNORMAL HIGH (ref 4.0–10.5)

## 2012-01-05 LAB — PREPARE RBC (CROSSMATCH)

## 2012-01-05 MED ORDER — ALBUMIN HUMAN 5 % IV SOLN
INTRAVENOUS | Status: AC
  Administered 2012-01-05: 12.5 g via INTRAVENOUS
  Filled 2012-01-05: qty 250

## 2012-01-05 MED ORDER — CALCIUM CHLORIDE 10 % IV SOLN
1.0000 g | Freq: Once | INTRAVENOUS | Status: AC
  Administered 2012-01-05: 1 g via INTRAVENOUS
  Filled 2012-01-05: qty 10

## 2012-01-05 MED ORDER — FUROSEMIDE 10 MG/ML IJ SOLN
80.0000 mg | Freq: Once | INTRAMUSCULAR | Status: AC
  Administered 2012-01-05: 80 mg via INTRAVENOUS
  Filled 2012-01-05: qty 8

## 2012-01-05 MED ORDER — PHENYLEPHRINE HCL 10 MG/ML IJ SOLN
0.0000 ug/min | INTRAVENOUS | Status: DC
  Administered 2012-01-05: 10 ug/min via INTRAVENOUS
  Filled 2012-01-05: qty 2

## 2012-01-05 MED ORDER — ALBUMIN HUMAN 5 % IV SOLN
12.5000 g | Freq: Once | INTRAVENOUS | Status: AC
  Administered 2012-01-05: 12.5 g via INTRAVENOUS

## 2012-01-05 MED FILL — Electrolyte-R (PH 7.4) Solution: INTRAVENOUS | Qty: 6000 | Status: AC

## 2012-01-05 MED FILL — Sodium Bicarbonate IV Soln 8.4%: INTRAVENOUS | Qty: 50 | Status: AC

## 2012-01-05 MED FILL — Heparin Sodium (Porcine) Inj 1000 Unit/ML: INTRAMUSCULAR | Qty: 2 | Status: AC

## 2012-01-05 MED FILL — Lidocaine HCl IV Inj 20 MG/ML: INTRAVENOUS | Qty: 5 | Status: AC

## 2012-01-05 MED FILL — Heparin Sodium (Porcine) Inj 1000 Unit/ML: INTRAMUSCULAR | Qty: 30 | Status: AC

## 2012-01-05 MED FILL — Sodium Chloride Irrigation Soln 0.9%: Qty: 3000 | Status: AC

## 2012-01-05 MED FILL — Mannitol IV Soln 20%: INTRAVENOUS | Qty: 500 | Status: AC

## 2012-01-05 MED FILL — Sodium Chloride IV Soln 0.9%: INTRAVENOUS | Qty: 1000 | Status: AC

## 2012-01-05 NOTE — Progress Notes (Signed)
Patient ID: [name], male   DOB: 10-Oct-1935, 76 y.o.   MRN: [id number]                   [address]            [name] [address]          [phone]     2 Days Post-Op Procedure(s) (LRB): REDO CORONARY ARTERY BYPASS GRAFTING (CABG) (N/A)  Total Length of Stay:  LOS: 12 days  BP 145/57  Pulse 74  Temp 99 F (37.2 C) (Oral)  Resp 15  Ht 5\' 9"  (1.753 m)  Wt 171 lb 11.8 oz (77.9 kg)  BMI 25.36 kg/m2  SpO2 97%     . sodium chloride 20 mL/hr at 01/05/12 0600  . sodium chloride 10 mL (01/03/12 1430)  . lactated ringers Stopped (01/05/12 0100)  . phenylephrine (NEO-SYNEPHRINE) Adult infusion Stopped (01/05/12 1300)  . DISCONTD: sodium chloride    . DISCONTD: nitroGLYCERIN Stopped (01/04/12 1900)  . DISCONTD: nitroPRUSSide Stopped (01/04/12 1500)     Lab Results  Component Value Date   WBC 11.4* 01/05/2012   HGB 7.0* 01/05/2012   HCT 21.1* 01/05/2012   PLT 88* 01/05/2012   GLUCOSE 69* 01/05/2012   CHOL 104 12/25/2011   TRIG 41 12/25/2011   HDL 56 12/25/2011   LDLCALC 40 12/25/2011   ALT 7 12/25/2011   AST 16 12/25/2011   NA 135 01/05/2012   K 4.7 01/05/2012   CL 104 01/05/2012   CREATININE 1.40* 01/05/2012   BUN 21 01/05/2012   CO2 18* 01/05/2012   INR 1.31 01/03/2012   HGBA1C 5.9* 12/25/2011   Stable day 2 post op, walked 300 feet Cr 1.4 Two units of blood and lasix after this am hct 21%   Delight Ovens MD  Beeper [phone] Office [phone] 01/05/2012 5:15 PM

## 2012-01-05 NOTE — Progress Notes (Signed)
CBG: 62  Treatment: 15 GM carbohydrate snack  Symptoms: Nervous/irritable  Follow-up CBG: Time: 0400 CBG Result: 79  Possible Reasons for Event: Inadequate meal intake  Comments/MD notified:n/a    Durwin Nora, Gayleen Orem

## 2012-01-05 NOTE — Progress Notes (Signed)
2 Days Post-Op Procedure(s) (LRB): REDO CORONARY ARTERY BYPASS GRAFTING (CABG) (N/A) Subjective: No complaints   Objective: Vital signs in last 24 hours: Temp:  [98.5 F (36.9 C)-99.7 F (37.6 C)] 99 F (37.2 C) (06/26 0738) Pulse Rate:  [61-98] 90  (06/26 0700) Cardiac Rhythm:  [-] A-V Sequential paced (06/26 0400) Resp:  [0-22] 16  (06/26 0700) BP: (72-180)/(42-89) 112/53 mmHg (06/26 0700) SpO2:  [92 %-100 %] 99 % (06/26 0700) Arterial Line BP: (110-204)/(52-112) 176/54 mmHg (06/25 1445) Weight:  [77.9 kg (171 lb 11.8 oz)] 77.9 kg (171 lb 11.8 oz) (06/26 0600)  Hemodynamic parameters for last 24 hours: PAP: (16-30)/(2-13) 22/5 mmHg  Intake/Output from previous day: 06/25 0701 - 06/26 0700 In: 1234.8 [P.O.:150; I.V.:976.8; IV Piggyback:108] Out: 1565 [Urine:1430; Chest Tube:135] Intake/Output this shift:    General appearance: alert and cooperative Neurologic: intact Heart: regular rate and rhythm Lungs: diminished breath sounds bibasilar Extremities: edema mild Wound: incision ok  Lab Results:  Basename 01/05/12 0510 01/04/12 1700  WBC 11.4* 19.6*  HGB 7.0* 8.0*  HCT 21.1* 23.7*  PLT 88* 131*   BMET:  Basename 01/05/12 0510 01/04/12 1700 01/04/12 1654 01/04/12 0400  NA 135 -- 138 --  K 4.7 -- 3.9 --  CL 104 -- 103 --  CO2 18* -- -- 21  GLUCOSE 69* -- 230* --  BUN 21 -- 16 --  CREATININE 1.40* 1.27 -- --  CALCIUM 9.2 -- -- 8.2*    PT/INR:  Basename 01/03/12 1445  LABPROT 16.5*  INR 1.31   ABG    Component Value Date/Time   PHART 7.368 01/03/2012 2238   HCO3 21.8 01/03/2012 2238   TCO2 20 01/04/2012 1654   ACIDBASEDEF 3.0* 01/03/2012 2238   O2SAT 95.0 01/03/2012 2238   CBG (last 3)   Basename 01/05/12 0400 01/05/12 0320 01/05/12 0024  GLUCAP 79 62* 91    Assessment/Plan: S/P Procedure(s) (LRB): REDO CORONARY ARTERY BYPASS GRAFTING (CABG) (N/A) Mobilize Continue foley due to blood transfusion; will continue until blood transfusion complete,  patient in ICU and urinary output monitoring Acute blood loss anemia due to surgery and dilution.  Will transfuse 2 units of prbc's since he is requiring neo. Hold antihypertensives started yesterday. IS   LOS: 12 days    Imogean Ciampa K 01/05/2012

## 2012-01-05 NOTE — Progress Notes (Signed)
BP low at the midnight reading after administering PM antihypertensive meds. Patient is sleepy but arousable and diaphoretic. Epicardial pacemaker initiated AAI @ 90 to aid in BP since HR had dropped to his intrinsic pacing rate AV 60. BP continued to decrease and Dr. Donata Clay paged for orders. Albumin with amp of 10% calcium chloride started  and neo synephrine gtt ordered to titrate for SBP at least 100 mmHg. Urine output was also reported to be low with 10cc at MN and only 5cc now. Will continue to monitor with the ordered interventions.

## 2012-01-05 NOTE — Progress Notes (Signed)
Maintaining NSR on Amiodarone. Will follow.

## 2012-01-05 NOTE — Progress Notes (Signed)
Inpatient Diabetes Program Recommendations  AACE/ADA: New Consensus Statement on Inpatient Glycemic Control  Target Ranges:  Prepandial:   less than 140 mg/dL      Peak postprandial:   less than 180 mg/dL (1-2 hours)      Critically ill patients:  140 - 180 mg/dL  Pager:  [phone] Hours:  8 am-10pm   Reason for Visit: Hypoglycemia:  62 mg/dL this am  Inpatient Diabetes Program Recommendations Insulin - Basal: Decrease Lantus to 12 units daily Correction (SSI): Change to ACHS since eating po diet    Alfredia Client PhD, RN Diabetes Coordinator  Office:  [phone] Team Pager:  [phone]

## 2012-01-06 LAB — BASIC METABOLIC PANEL
Calcium: 9 mg/dL (ref 8.4–10.5)
GFR calc Af Amer: 57 mL/min — ABNORMAL LOW (ref >90)
GFR calc non Af Amer: 49 mL/min — ABNORMAL LOW (ref >90)
Glucose, Bld: 90 mg/dL (ref 70–99)
Potassium: 3.7 mEq/L (ref 3.5–5.1)
Sodium: 137 mEq/L (ref 135–145)

## 2012-01-06 LAB — GLUCOSE, CAPILLARY
Glucose-Capillary: 178 mg/dL — ABNORMAL HIGH (ref 70–99)
Glucose-Capillary: 140 mg/dL — ABNORMAL HIGH (ref 70–99)

## 2012-01-06 LAB — CBC
Hemoglobin: 9 g/dL — ABNORMAL LOW (ref 13.0–17.0)
MCH: 31.8 pg (ref 26.0–34.0)
MCHC: 34.5 g/dL (ref 30.0–36.0)
Platelets: 114 10*3/uL — ABNORMAL LOW (ref 150–400)

## 2012-01-06 LAB — TYPE AND SCREEN
ABO/RH(D): O POS
Antibody Screen: NEGATIVE
Unit division: 0

## 2012-01-06 MED ORDER — BISACODYL 5 MG PO TBEC: 10.0000 mg | DELAYED_RELEASE_TABLET | Freq: Every day | ORAL | Status: DC | PRN

## 2012-01-06 MED ORDER — ONDANSETRON HCL 4 MG/2ML IJ SOLN: 4.0000 mg | Freq: Four times a day (QID) | INTRAMUSCULAR | Status: DC | PRN

## 2012-01-06 MED ORDER — AMIODARONE HCL 200 MG PO TABS
200.0000 mg | ORAL_TABLET | Freq: Every day | ORAL | Status: DC
  Administered 2012-01-07 – 2012-01-08 (×2): 200 mg via ORAL
  Filled 2012-01-06 (×3): qty 1

## 2012-01-06 MED ORDER — SODIUM CHLORIDE 0.9 % IJ SOLN
3.0000 mL | Freq: Two times a day (BID) | INTRAMUSCULAR | Status: DC
  Administered 2012-01-07 – 2012-01-08 (×3): 3 mL via INTRAVENOUS

## 2012-01-06 MED ORDER — POTASSIUM CHLORIDE 10 MEQ/50ML IV SOLN
INTRAVENOUS | Status: AC
  Administered 2012-01-06: 10 meq via INTRAVENOUS
  Filled 2012-01-06: qty 150

## 2012-01-06 MED ORDER — MOVING RIGHT ALONG BOOK
Freq: Once | Status: AC
  Administered 2012-01-06: 12:00:00
  Filled 2012-01-06: qty 1

## 2012-01-06 MED ORDER — OXYCODONE HCL 5 MG PO TABS: 5.0000 mg | ORAL_TABLET | ORAL | Status: DC | PRN

## 2012-01-06 MED ORDER — METFORMIN HCL 500 MG PO TABS
1000.0000 mg | ORAL_TABLET | Freq: Two times a day (BID) | ORAL | Status: DC
  Administered 2012-01-06 – 2012-01-09 (×6): 1000 mg via ORAL
  Filled 2012-01-06 (×9): qty 2

## 2012-01-06 MED ORDER — SODIUM CHLORIDE 0.9 % IJ SOLN: 3.0000 mL | INTRAMUSCULAR | Status: DC | PRN

## 2012-01-06 MED ORDER — BISACODYL 10 MG RE SUPP: 10.0000 mg | Freq: Every day | RECTAL | Status: DC | PRN

## 2012-01-06 MED ORDER — TRAMADOL HCL 50 MG PO TABS: 50.0000 mg | ORAL_TABLET | ORAL | Status: DC | PRN

## 2012-01-06 MED ORDER — ASPIRIN EC 325 MG PO TBEC
325.0000 mg | DELAYED_RELEASE_TABLET | Freq: Every day | ORAL | Status: DC
  Administered 2012-01-07 – 2012-01-09 (×3): 325 mg via ORAL
  Filled 2012-01-06 (×4): qty 1

## 2012-01-06 MED ORDER — DOCUSATE SODIUM 100 MG PO CAPS
200.0000 mg | ORAL_CAPSULE | Freq: Every day | ORAL | Status: DC
  Filled 2012-01-06 (×3): qty 2

## 2012-01-06 MED ORDER — SODIUM CHLORIDE 0.9 % IV SOLN: 250.0000 mL | INTRAVENOUS | Status: DC | PRN

## 2012-01-06 MED ORDER — INSULIN ASPART 100 UNIT/ML ~~LOC~~ SOLN
0.0000 [IU] | Freq: Three times a day (TID) | SUBCUTANEOUS | Status: DC
  Administered 2012-01-07 – 2012-01-08 (×2): 2 [IU] via SUBCUTANEOUS

## 2012-01-06 MED ORDER — PANTOPRAZOLE SODIUM 40 MG PO TBEC
40.0000 mg | DELAYED_RELEASE_TABLET | Freq: Every day | ORAL | Status: DC
  Administered 2012-01-07 – 2012-01-09 (×3): 40 mg via ORAL
  Filled 2012-01-06 (×3): qty 1

## 2012-01-06 MED ORDER — POTASSIUM CHLORIDE 10 MEQ/50ML IV SOLN
10.0000 meq | INTRAVENOUS | Status: AC | PRN
  Administered 2012-01-06 (×3): 10 meq via INTRAVENOUS

## 2012-01-06 MED ORDER — ONDANSETRON HCL 4 MG PO TABS
4.0000 mg | ORAL_TABLET | Freq: Four times a day (QID) | ORAL | Status: DC | PRN
  Administered 2012-01-08: 4 mg via ORAL
  Filled 2012-01-06: qty 1

## 2012-01-06 MED ORDER — POTASSIUM CHLORIDE CRYS ER 20 MEQ PO TBCR
40.0000 meq | EXTENDED_RELEASE_TABLET | Freq: Every day | ORAL | Status: AC
  Administered 2012-01-07: 40 meq via ORAL
  Filled 2012-01-06: qty 2

## 2012-01-06 MED ORDER — FUROSEMIDE 40 MG PO TABS
40.0000 mg | ORAL_TABLET | Freq: Every day | ORAL | Status: AC
  Administered 2012-01-07: 40 mg via ORAL
  Filled 2012-01-06 (×2): qty 1

## 2012-01-06 MED ORDER — METOPROLOL TARTRATE 25 MG PO TABS
25.0000 mg | ORAL_TABLET | Freq: Two times a day (BID) | ORAL | Status: DC
  Administered 2012-01-06 – 2012-01-09 (×6): 25 mg via ORAL
  Filled 2012-01-06 (×9): qty 1

## 2012-01-06 MED ORDER — ACETAMINOPHEN 325 MG PO TABS
650.0000 mg | ORAL_TABLET | Freq: Four times a day (QID) | ORAL | Status: DC | PRN
  Administered 2012-01-06 – 2012-01-08 (×3): 650 mg via ORAL
  Filled 2012-01-06 (×3): qty 2

## 2012-01-06 MED FILL — Magnesium Sulfate Inj 50%: INTRAMUSCULAR | Qty: 10 | Status: AC

## 2012-01-06 MED FILL — Potassium Chloride Inj 2 mEq/ML: INTRAVENOUS | Qty: 40 | Status: AC

## 2012-01-06 NOTE — Progress Notes (Signed)
Patient Name: GERMAIN KOOPMANN Date of Encounter: 01/06/2012    SUBJECTIVE: Now on telemetry unit. He feels better but unable to get sleep due to interruptions.Chest is sore but improving.  TELEMETRY:  NSR: Filed Vitals:   01/06/12 0800 01/06/12 0830 01/06/12 1128 01/06/12 1133  BP: 178/76 168/79 192/93 192/89  Pulse: 86 84 80   Temp:   98.3 F (36.8 C)   TempSrc:      Resp: 21 24 18    Height:      Weight:      SpO2: 97% 98% 94%     Intake/Output Summary (Last 24 hours) at 01/06/12 1306 Last data filed at 01/06/12 0900  Gross per 24 hour  Intake    968 ml  Output   3455 ml  Net  -2487 ml    LABS: Basic Metabolic Panel:  Basename 01/06/12 0410 01/05/12 0510 01/04/12 1700 01/04/12 0400  NA 137 135 -- --  K 3.7 4.7 -- --  CL 103 104 -- --  CO2 23 18* -- --  GLUCOSE 90 69* -- --  BUN 25* 21 -- --  CREATININE 1.38* 1.40* -- --  CALCIUM 9.0 9.2 -- --  MG -- -- 1.8 2.0  PHOS -- -- -- --   CBC:  Basename 01/06/12 0410 01/05/12 0510  WBC 12.6* 11.4*  NEUTROABS -- --  HGB 9.0* 7.0*  HCT 26.1* 21.1*  MCV 92.2 96.8  PLT 114* 88*    Radiology/Studies:  No new.  Physical Exam: Blood pressure 192/89, pulse 80, temperature 98.3 F (36.8 C), temperature source Oral, resp. rate 18, height 5\' 9"  (1.753 m), weight 77.5 kg (170 lb 13.7 oz), SpO2 94.00%. Weight change: -0.4 kg (-14.1 oz)   S4 gallop. 1/6 systolic murmur.  ASSESSMENT:  1. No recurrent atrial fibrillation. 2. Amiodarone  Therapy as OP. 3. Anemia, again treated yesterday with transfusion.   Plan:  1. Decrease the Amiodarone to 200 mg daily(Op dose is 100 mg daily).  Selinda Eon 01/06/2012, 1:06 PM

## 2012-01-06 NOTE — Progress Notes (Signed)
CARDIAC REHAB PHASE I   PRE:  Rate/Rhythm: 84SR  BP:  Supine: 160/70  Sitting:   Standing:    SaO2: 93%RA  MODE:  Ambulation: 350 ft   POST:  Rate/Rhythem: 86  BP:  Supine: 160/82  Sitting:   Standing:    SaO2: 92%RA 1435-1518 Pt walked 350 ft on RA with rolling walker and asst x 1. Slow,steady gait. Tolerated well. Back to bed after walk. Encouraged walks with staff.  Duanne Limerick

## 2012-01-06 NOTE — Progress Notes (Signed)
3 Days Post-Op Procedure(s) (LRB): REDO CORONARY ARTERY BYPASS GRAFTING (CABG) (N/A) Subjective: No complaints  Objective: Vital signs in last 24 hours: Temp:  [97.5 F (36.4 C)-99.7 F (37.6 C)] 99.1 F (37.3 C) (06/27 0747) Pulse Rate:  [66-89] 77  (06/27 0700) Cardiac Rhythm:  [-] Normal sinus rhythm (06/27 0500) Resp:  [2-27] 18  (06/27 0700) BP: (130-182)/(53-106) 148/74 mmHg (06/27 0700) SpO2:  [93 %-99 %] 98 % (06/27 0700) Weight:  [77.5 kg (170 lb 13.7 oz)] 77.5 kg (170 lb 13.7 oz) (06/27 0500)  Hemodynamic parameters for last 24 hours:    Intake/Output from previous day: 06/26 0701 - 06/27 0700 In: 1961.4 [P.O.:600; I.V.:505.4; Blood:698; IV Piggyback:158] Out: 3465 [Urine:3465] Intake/Output this shift:    General appearance: alert and cooperative Neurologic: intact Heart: regular rate and rhythm, S1, S2 normal, no murmur, click, rub or gallop Lungs: clear to auscultation bilaterally Extremities: extremities normal, atraumatic, no cyanosis or edema Wound: dressing dry  Lab Results:  Basename 01/06/12 0410 01/05/12 0510  WBC 12.6* 11.4*  HGB 9.0* 7.0*  HCT 26.1* 21.1*  PLT 114* 88*   BMET:  Basename 01/06/12 0410 01/05/12 0510  NA 137 135  [name] 3.7 4.7  CL 103 104  CO2 23 18*  GLUCOSE 90 69*  BUN 25* 21  CREATININE 1.38* 1.40*  CALCIUM 9.0 9.2    PT/INR:  Basename 01/03/12 1445  LABPROT 16.5*  INR 1.31   ABG    Component Value Date/Time   PHART 7.368 01/03/2012 2238   HCO3 21.8 01/03/2012 2238   TCO2 20 01/04/2012 1654   ACIDBASEDEF 3.0* 01/03/2012 2238   O2SAT 95.0 01/03/2012 2238   CBG (last 3)   Basename 01/06/12 0745 01/06/12 0532 01/06/12 0437  GLUCAP 121* 124* 73    Assessment/Plan: S/P Procedure(s) (LRB): REDO CORONARY ARTERY BYPASS GRAFTING (CABG) (N/A) Mobilize Diuresis Plan for transfer to step-down: see transfer orders   LOS: 13 days    [name] 01/06/2012

## 2012-01-06 NOTE — Progress Notes (Signed)
CBG: 66  Treatment: orange juice   Symptoms: none at this time  Follow-up CBG: Time:0435 CBG Result: 73  Possible Reasons for Event: inadequate meal intake   Comments/MD notified:     Rosie Fate

## 2012-01-07 ENCOUNTER — Encounter (HOSPITAL_COMMUNITY): Payer: Self-pay | Admitting: Surgery

## 2012-01-07 LAB — GLUCOSE, CAPILLARY
Glucose-Capillary: 102 mg/dL — ABNORMAL HIGH (ref 70–99)
Glucose-Capillary: 126 mg/dL — ABNORMAL HIGH (ref 70–99)

## 2012-01-07 MED ORDER — AMIODARONE HCL 100 MG PO TABS: 100.0000 mg | ORAL_TABLET | Freq: Every day | ORAL | Status: DC

## 2012-01-07 MED ORDER — TRAMADOL HCL 50 MG PO TABS: 50.0000 mg | ORAL_TABLET | Freq: Four times a day (QID) | ORAL | Status: AC | PRN

## 2012-01-07 MED ORDER — AMIODARONE HCL 200 MG PO TABS: 200.0000 mg | ORAL_TABLET | Freq: Every day | ORAL | Status: DC

## 2012-01-07 MED ORDER — METOPROLOL TARTRATE 25 MG PO TABS: 25.0000 mg | ORAL_TABLET | Freq: Two times a day (BID) | ORAL | Status: DC

## 2012-01-07 MED ORDER — LOSARTAN POTASSIUM 50 MG PO TABS
50.0000 mg | ORAL_TABLET | Freq: Every day | ORAL | Status: DC
  Administered 2012-01-07 – 2012-01-09 (×3): 50 mg via ORAL
  Filled 2012-01-07 (×3): qty 1

## 2012-01-07 MED ORDER — CLONIDINE HCL 0.2 MG PO TABS
0.2000 mg | ORAL_TABLET | Freq: Two times a day (BID) | ORAL | Status: DC
  Administered 2012-01-07 – 2012-01-09 (×5): 0.2 mg via ORAL
  Filled 2012-01-07 (×6): qty 1

## 2012-01-07 MED ORDER — LOSARTAN POTASSIUM 50 MG PO TABS: 50.0000 mg | ORAL_TABLET | Freq: Every day | ORAL | Status: DC

## 2012-01-07 MED ORDER — ASPIRIN 325 MG PO TBEC: 325.0000 mg | DELAYED_RELEASE_TABLET | Freq: Every day | ORAL | Status: AC

## 2012-01-07 NOTE — Progress Notes (Signed)
Sitting in chair, comfortable, no complaints  No atrial fibrillation.  Blood pressure 171/74, pulse 82 Heart is regular rate and rhythm on exam Lungs are clear  76 year old status post redo coronary artery bypass grafting with atrial fibrillation, currently in sinus rhythm, currently off of Coumadin due to anemia  Atrial fibrillation  - When he is discharged home, consider decreasing his amiodarone dose to 100 mg a day which he was on chronically as an outpatient.  - Currently off of Coumadin, anemia present. No evidence of atrial fibrillation currently.  Hypertension  - Both clonidine and Cozaar been restarted.

## 2012-01-07 NOTE — Progress Notes (Addendum)
4 Days Post-Op Procedure(s) (LRB): REDO CORONARY ARTERY BYPASS GRAFTING (CABG) (N/A)  Subjective: Patient resting this am. Has no real complaints.  Objective: Vital signs in last 24 hours: Patient Vitals for the past 24 hrs:  BP Temp Temp src Pulse Resp SpO2 Weight  01/07/12 0637 - - - - - - 165 lb (74.844 kg)  01/07/12 0635 171/74 mmHg 98.7 F (37.1 C) - 82  18  93 % -  01/07/12 0032 158/66 mmHg 98.9 F (37.2 C) Oral - - - -  01/06/12 2229 177/68 mmHg - - - - - -  01/06/12 2156 194/83 mmHg 100.4 F (38 C) Oral 93  20  96 % -  01/06/12 1133 192/89 mmHg - - - - - -  01/06/12 1128 192/93 mmHg 98.3 F (36.8 C) - 80  18  94 % -  01/06/12 0830 168/79 mmHg - - 84  24  98 % -   Pre op weight 69.4  kg Current Weight  01/07/12 165 lb (74.844 kg)      Intake/Output from previous day: 06/27 0701 - 06/28 0700 In: 270 [P.O.:200; I.V.:20; IV Piggyback:50] Out: 1425 [Urine:1425]   Physical Exam:  Cardiovascular: RRR, no murmurs, gallops, or rubs. Pulmonary: Clear to auscultation bilaterally; no rales, wheezes, or rhonchi. Abdomen: Soft, non tender, bowel sounds present. Extremities: Mild bilateral lower extremity edema. Wounds: Dressing is clean and dry.    Lab Results: CBC: Basename 01/06/12 0410 01/05/12 0510  WBC 12.6* 11.4*  HGB 9.0* 7.0*  HCT 26.1* 21.1*  PLT 114* 88*   BMET:  Basename 01/06/12 0410 01/05/12 0510  NA 137 135  K 3.7 4.7  CL 103 104  CO2 23 18*  GLUCOSE 90 69*  BUN 25* 21  CREATININE 1.38* 1.40*  CALCIUM 9.0 9.2      Assessment/Plan:  1. CV - SR.Continue Amiodarone 200 mg daily,Lopressor 25 bid.Hypertensive so will restart Clonidine 0.2 mg bid and Cozaar 50 mg po daily. 2.  Pulmonary - Encourage incentive spirometer. 3. Volume Overload - Continue with diuresis. 4.  Acute blood loss anemia - Last H and H stable at 9 and 26.1. 5.DM-CBGs 140/113/86.HGA1C 5.9.Continue Metformin. 6.Thrombocytopenia-Platelets increased to 114,000. 7.Remove EPW in  am. 8.Possible discharge in 1-2 days.  ZIMMERMAN,DONIELLE MPA-C 01/07/2012    Chart reviewed, patient examined, agree with above.

## 2012-01-07 NOTE — Discharge Summary (Signed)
Physician Discharge Summary  Patient ID: [name] MRN: [id number] DOB/AGE: 07-29-1935 76 y.o.  Admit date: 12/24/2011 Discharge date: 01/09/2012  Admission Diagnoses: 1.History of CAD (s/p CABG x 1 1994) 2.History of HTN 3.History of hyperlipidemia 4.History of DM 5.History of PVD 6.Histroy of macrocytic anemia 7.History of GERD 8.History of prostate cancer 9.history of PAF 10.History of DVT 11. History of remote tobacco abuse  Discharge Diagnoses:  1.History of CAD (s/p CABG x 1 1994) 2.History of HTN 3.History of hyperlipidemia 4.History of DM 5.History of PVD 6.Histroy of macrocytic anemia 7.History of GERD 8.History of prostate cancer 9.history of PAF 10.History of DVT 11. History of remote tobacco abuse 12.ABL anemia 13.Thrombocyopenia  Procedure (s):  1.Cardiac catheterization done by Dr. [name] on 12/24/2011: The left main coronary artery is calcified diffusely, with eccentric 50% distal narrowing. The left anterior descending artery is transapical. Irregularities are noted in the proximal and mid vessel up to 50% but no significant obstruction is seen. A large trifurcating diagonal branch arises from the proximal vessel. The largest branch contains a 50-70% stenosis. The second diagonal is large and widely patent. The distal LAD contain segmental 80% stenosis at the apex.The left circumflex artery is heavily calcified and gives 2 large obtuse marginal branches before the first obtuse marginal branch there is eccentric 90% stenosis. The first and second marginal branches are patent with the second marginal containing 80% mid vessel stenosis or the distal circumflex is 95% obstructed. The mid circumflex after the second obtuse marginal is 95% obstructed.The right coronary artery is totally occluded proximally.  2. (1.) ultrasound access left femoral artery  (2.) abdominal aortogram  (3.) first order catheterization  (4.) celiac artery angiogram  (5.)first order  catheterization (superior mesenteric artery)  (6. )superior mesenteric artery angiogram by Dr. Cory Roughen 12/28/2011 Findings:  Aortogram: The supraceliac aorta does not show any significant stenotic lesions. The infrarenal abdominal aorta is heavily calcified but patent without significant stenosis. The iliac arterial system is without significant stenosis although it is very calcified. A large inferior mesenteric artery is identified which opacifies the marginal artery.  Celiac artery: The stent within the celiac artery is patent however at its distal extent extending into the splenic artery there is a focal stenosis. The stenosis goes across from the origin of the left gastric artery. The hepatic artery appears to fill from superior mesenteric or inferior mesenteric artery collaterals.  Superior mesenteric artery: The stent within the superior mesenteric artery is widely patent. The artery itself was heavily calcified. I did not see any significant lesions within the superior mesenteric artery. 3.Redo median sternotomy, extracorporeal  circulation, redo coronary artery bypass graft surgery x4 using a left internal mammary artery graft to the left anterior descending coronary artery, with a free right internal mammary artery graft to the posterior descending branch of the right coronary artery, a saphenous vein graft sequentially to the first and second obtuse marginal branches of the left circumflex coronary artery. Endoscopic vein harvesting from both legs by Dr. Laneta Simmers on 01/03/2012.   History of Presenting Illness:  This is a 76 year old African American male  with known history of coronary artery disease ( who underwent coronary artery bypass grafting x1 by Dr. Laneta Simmers in 1994), hypertension, hyperlipidemia, type 2 diabetes mellitus, and peripheral vascular disease. The patient apparently had single-vessel saphenous vein graft placed to the posterior descending coronary artery using open saphenous vein  harvest from the right lower leg (specific details of this procedure not currently available.) The patient also  has history of paroxysmal atrial fibrillation and underwent placement of permanent pacemaker in 1996, with generator change more recently. Details of this again remained unclear at this time; however, the patient has done well from a cardiovascular standpoint until recently. He described a several month history of classical symptoms of angina pectoris, which initially developed when he was mowing the lawn. He described the substernal chest pain radiating across his chest brought on with physical activity and relieved by rest. The patient reported progressive exertional shortness of breath;however,  he denies resting shortness of breath, PND, orthopnea, or lower extremity edema. He has not had tachypalpitations nor syncope. Coincidentally with these symptoms, the patient also has had severe symptoms of lower extremity claudication involving both lower legs, left greater than right. The patient stated that actually the leg pain slows him down more than the chest pain, and he has even had one or 2 episodes of pain in the left calf and foot occuring at rest while he's in bed at night. Finally, the patient had history of mesenteric ischemia, for which he underwent stenting of the mesenteric vessels in the past. Recently, he has not had a good appetite and he thinks he may lost a little weight. He has vague intermittent abdominal pain that seems to the postprandial, but he noted that these symptoms are all still considerably better than they were prior to stenting of the mesenteric vessels several years ago. A consultation was obtained with Dr. [name] regarding further evaluation for possible SMA stent stenosis. 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 The location of the  stenosis is not ideal for percutaneous intervention as it involves the origin of the left gastric artery. In addition, based on the patient's other mesenteric vessel evaluation Dr. [name] did not feel that this lesion was contributing towards abdominal symptoms. He is well collateralized from the marginal artery of [place] and superior mesenteric artery. In addition, he had a large and widely patent inferior mesenteric artery. No intervention was required. A cardiac catheterization was then done by Dr. [name] on 12/24/2011. The patient was found to have multivessel coronary artery disease.A cardiothoracic consultation was obtained for the consideration of coronary artery bypass grafting surgery.Potential risks, benefits, and complications of the surgery were discussed with the patient and he agreed to proceed. He underwent a redo sternotomy for CABGx4 on 01/03/2012.  Brief Hospital Course:  He was extubated without difficulty later the evening of surgery. He remained afebrile and hemodynamically stable. His Theone Murdoch, a line, chest tubes, and foley were all removed early in his post operative course. He was weaned off his Nipride and Nitro, which he was on for hypertension. He was started on Lopressor, which was titrated accordingly and restarted on Amiodarone.Marland KitchenHe maintained SR post operatively and had no afib.He was volume overloaded and diuresed.He was found to have ABL anemia and then became hypotensive.He was started on Neo synephrine and hypertensive medications were held. His H and H went down to 7 and 21.1. He was given 2 units of PRBCs.Follow up H and H was then up to 9 and 26.1.His hypotension then resolved and he was restarted on blood pressure medications.He was restarted on his Metformin as taken preoperatively with good glucose control.Patient was felt stable for transfer from the ICU to 2000 for further convalescence on 01/06/2012.He then became hypertensive so he was restarted on Clonidine and  Cozaar. He has  been tolerating a diet and has had a bowel movement. Epicardial pacing wires and chest tube sutures will be removed prior to discharge.He has been ambulating without difficulty. He was initially going to be discharged on Saturday;however, later that afternoon, he had an episode of nausea and emesis. As a result, his discharge was held. He has had no further nausea, emesis, or any complaints of abdominal pain. He feels as though yesterday was an "isolated incidence." He wants to go home today. His vital signs have remained stable and he is afebrile. He will be discharged today.  Latest Vital Signs: Blood pressure 171/74, pulse 82, temperature 98.7 F (37.1 C), temperature source Oral, resp. rate 18, height 5\' 9"  (1.753 m), weight 165 lb (74.844 kg), SpO2 93.00%.  Physical Exam: Cardiovascular: RRR, no murmurs, gallops, or rubs.  Pulmonary: Clear to auscultation bilaterally; no rales, wheezes, or rhonchi.  Abdomen: Soft, non tender, bowel sounds present.  Extremities: Mild bilateral lower extremity edema.  Wounds: Dressing is clean and dry.    Discharge Condition:Stable  Recent laboratory studies:  Lab Results  Component Value Date   WBC 12.6* 01/06/2012   HGB 9.0* 01/06/2012   HCT 26.1* 01/06/2012   MCV 92.2 01/06/2012   PLT 114* 01/06/2012   Lab Results  Component Value Date   NA 137 01/06/2012   K 3.7 01/06/2012   CL 103 01/06/2012   CO2 23 01/06/2012   CREATININE 1.38* 01/06/2012   GLUCOSE 90 01/06/2012      Diagnostic Studies:  01/05/2012  *RADIOLOGY REPORT*  Clinical Data: Postop cardiac surgery.  PORTABLE CHEST - 1 VIEW  Comparison: 01/04/2012.  Findings: Trachea is midline.  Heart is enlarged, stable.  Thoracic aorta is calcified.  Median sternotomy wires are unchanged in position.  Pacemaker lead tips project over the right atrium and right ventricle.  Left IJ Swan-Ganz catheter has been removed, with sheath remaining in place.  Chest tubes and mediastinal drains have  been removed as well.  Epicardial pacer wires remain in place.  Mild diffuse bilateral air space disease, bibasilar predominant, with bilateral pleural effusions.  No pneumothorax.  IMPRESSION: Mild edema with bilateral pleural effusions and bibasilar atelectasis.  Original Report Authenticated By: Reyes Ivan, M.D.    Discharge Orders    Future Appointments: Provider: Department: Dept Phone: Center:   02/01/2012 12:00 PM Alleen Borne, MD [facility] [phone] [facility]   07/03/2012 9:00 AM [facility] [phone] [facility]   07/03/2012 9:30 AM Nada Libman, MD [facility] [phone] [facility]      Discharge Medications: Medication List  As of 01/08/2012  9:04 AM   STOP taking these medications         amLODipine 5 MG tablet      diltiazem 180 MG 24 hr capsule      nitroGLYCERIN 0.4 MG SL tablet      warfarin 5 MG tablet         TAKE these medications         amiodarone 100 MG tablet   Commonly known as: PACERONE   Take 1 tablet (100 mg total) by mouth daily.      ARTHRITIS PAIN RELIEF 650 MG CR tablet   Generic drug: acetaminophen   Take 1,300 mg by mouth 2 (two) times daily.      aspirin 325 MG EC tablet   Take 1 tablet (325 mg total) by mouth daily.      atorvastatin 80 MG tablet   Commonly known as:  LIPITOR   Take 80 mg by mouth daily.      CALCIUM PO   Take 2,000 mg by mouth daily.      cholecalciferol 1000 UNITS tablet   Commonly known as: VITAMIN D   Take 1,000 Units by mouth daily.      cloNIDine 0.2 MG tablet   Commonly known as: CATAPRES   Take 0.2 mg by mouth 2 (two) times daily.      ferrous sulfate 325 (65 FE) MG tablet   Take 325 mg by mouth 2 (two) times daily.      furosemide 80 MG tablet   Commonly known as: LASIX   Take 40 mg by mouth daily.      losartan 50 MG tablet   Commonly known as: COZAAR   Take 1 tablet (50 mg total) by mouth daily.      LUPRON DEPOT IM   Inject into the muscle See admin instructions. Every  4 months as needed      metFORMIN 500 MG tablet   Commonly known as: GLUCOPHAGE   Take 1,000 mg by mouth 2 (two) times daily with a meal.      metoprolol tartrate 25 MG tablet   Commonly known as: LOPRESSOR   Take 1 tablet (25 mg total) by mouth 2 (two) times daily.      omeprazole 20 MG capsule   Commonly known as: PRILOSEC   Take 20 mg by mouth daily.      Pancrelipase (Lip-Prot-Amyl) 5000 UNITS Cpep   Take 2 capsules by mouth 3 (three) times daily.      potassium chloride 20 MEQ packet   Commonly known as: KLOR-CON   Take 20 mEq by mouth 2 (two) times daily.      silodosin 8 MG Caps capsule   Commonly known as: RAPAFLO   Take 8 mg by mouth daily with breakfast.      traMADol 50 MG tablet   Commonly known as: ULTRAM   Take 1-2 tablets (50-100 mg total) by mouth every 6 (six) hours as needed for pain.             Follow Up Appointments: Follow-up Information    Follow up with Alleen Borne, MD. (PA/LAT CXR to be taken on 02/01/2012 at 11:00 am;Appointment with Dr. Laneta Simmers is on 02/01/2012 at 12:00 pm)    Contact information:   [address] [place] [address] [phone]       Follow up with Lesleigh Noe, MD. (Call for a follow up appointment for 2 weeks)    Contact information:   [address] [place] [address] [place] [address] [phone]       Follow up with Jorge Ny, MD. (Call for a follow up appointment)    Contact information:   [address] [place] [address] [phone]          Signed: Doree Fudge MPA-C 01/07/2012, 9:17 AM

## 2012-01-07 NOTE — Discharge Instructions (Signed)
Activity: 1.May walk up steps                2.No lifting more than ten pounds for four weeks.                 3.No driving for four weeks.                4.Stop any activity that causes chest pain, shortness of breath, dizziness, sweating or excessive weakness.                5.Avoid straining.                6.Continue with your breathing exercises daily.  Diet: Diabetic, low fat, Low salt diet  Wound Care: May shower.  Clean wounds with mild soap and water daily. Contact the office at [phone] if any problems arise. Coronary Artery Bypass Grafting Care After Refer to this sheet in the next few weeks. These instructions provide you with information on caring for yourself after your procedure. Your caregiver may also give you more specific instructions. Your treatment has been planned according to current medical practices, but problems sometimes occur. Call your caregiver if you have any problems or questions after your procedure.  Recovery from open heart surgery will be different for everyone. Some people feel well after 3 or 4 weeks, while for others it takes longer. After heart surgery, it may be normal to:  Not have an appetite, feel nauseated by the smell of food, or only want to eat a small amount.   Be constipated because of changes in your diet, activity, and medicines. Eat foods high in fiber. Add fresh fruits and vegetables to your diet. Stool softeners may be helpful.   Feel sad or unhappy. You may be frustrated or cranky. You may have good days and bad days. Do not give up. Talk to your caregiver if you do not feel better.   Feel weakness and fatigue. You many need physical therapy or cardiac rehabilitation to get your strength back.   Develop an irregular heartbeat called atrial fibrillation. Symptoms of atrial fibrillation are a fast, irregular heartbeat or feelings of fluttery heartbeats, shortness of breath, low blood pressure, and dizziness. If these symptoms develop,  see your caregiver right away.  MEDICATION  Have a list of all the medicines you will be taking when you leave the hospital. For every medicine, know the following:   Name.   Exact dose.   Time of day to be taken.   How often it should be taken.   Why you are taking it.   Ask which medicines should or should not be taken together. If you take more than one heart medicine, ask if it is okay to take them together. Some heart medicines should not be taken at the same time because they may lower your blood pressure too much.   Narcotic pain medicine can cause constipation. Eat fresh fruits and vegetables. Add fiber to your diet. Stool softener medicine may help relieve constipation.   Keep a copy of your medicines with you at all times.   Do not add or stop taking any medicine until you check with your caregiver.   Medicines can have side effects. Call your caregiver who prescribed the medicine if you:   Start throwing up, have diarrhea, or have stomach pain.   Feel dizzy or lightheaded when you stand up.   Feel your heart is skipping beats or is beating too fast  or too slow.   Develop a rash.   Notice unusual bruising or bleeding.  HOME CARE INSTRUCTIONS  After heart surgery, it is important to learn how to take your pulse. Have your caregiver show you how to take your pulse.   Use your incentive spirometer. Ask your caregiver how long after surgery you need to use it.  Care of your chest incision  Tell your caregiver right away if you notice clicking in your chest (sternum).   Support your chest with a pillow or your arms when you take deep breaths and cough.   Follow your caregiver's instructions about when you can bathe or swim.   Protect your incision from sunlight during the first year to keep the scar from getting dark.   Tell your caregiver if you notice:   Increased tenderness of your incision.   Increased redness or swelling around your incision.    Drainage or pus from your incision.  Care of your leg incision(s)  Avoid crossing your legs.   Avoid sitting for long periods of time. Change positions every half hour.   Elevate your leg(s) when you are sitting.   Check your leg(s) daily for swelling. Check the incisions for redness or drainage.   Wear your elastic stockings as told by your caregiver. Take them off at bedtime.  Diet  Diet is very important to heart health.   Eat plenty of fresh fruits and vegetables. Meats should be lean cut. Avoid canned, processed, and fried foods.   Talk to a dietician. They can teach you how to make healthy food and drink choices.  Weight  Weigh yourself every day. This is important because it helps to know if you are retaining fluid that may make your heart and lungs work harder.   Use the same scale each time.   Weigh yourself every morning at the same time. You should do this after you go to the bathroom, but before you eat breakfast.   Your weight will be more accurate if you do not wear any clothes.   Record your weight.   Tell your caregiver if you have gained 2 pounds or more overnight.  Activity Stop any activity at once if you have chest pain, shortness of breath, irregular heartbeats, or dizziness. Get help right away if you have any of these symptoms.  Bathing.  Avoid soaking in a bath or hot tub until your incisions are healed.   Rest. You need a balance of rest and activity.   Exercise. Exercise per your caregiver's advice. You may need physical therapy or cardiac rehabilitation to help strengthen your muscles and build your endurance.   Climbing stairs. Unless your caregiver tells you not to climb stairs, go up stairs slowly and rest if you tire. Do not pull yourself up by the handrail.   Driving a car. Follow your caregiver's advice on when you may drive. You may ride as a passenger at any time. When traveling for long periods of time in a car, get out of the car and  walk around for a few minutes every 2 hours.   Lifting. Avoid lifting, pushing, or pulling anything heavier than 10 pounds for 6 weeks after surgery or as told by your caregiver.   Returning to work. Check with your caregiver. People heal at different rates. Most people will be able to go back to work 6 to 12 weeks after surgery.   Sexual activity. You may resume sexual relations as told by your  caregiver.  SEEK MEDICAL CARE IF:  Any of your incisions are red, painful, or have any type of drainage coming from them.   You have an oral temperature above 102 F (38.9 C).   You have ankle or leg swelling.   You have pain in your legs.   You have weight gain of 2 or more pounds a day.   You feel dizzy or lightheaded when you stand up.  SEEK IMMEDIATE MEDICAL CARE IF:  You have angina or chest pain that goes to your jaw or arms. Call your local emergency services right away.   You have shortness of breath at rest or with activity.   You have a fast or irregular heartbeat (arrhythmia).   There is a "clicking" in your sternum when you move.   You have numbness or weakness in your arms or legs.  MAKE SURE YOU:  Understand these instructions.   Will watch your condition.   Will get help right away if you are not doing well or get worse.  Document Released: 01/15/2005 Document Revised: 06/17/2011 Document Reviewed: 09/02/2010 [facility] Patient Information 2012 [place], [place].

## 2012-01-07 NOTE — Progress Notes (Signed)
CARDIAC REHAB PHASE I   PRE:  Rate/Rhythm: 78SR  BP:  Supine:   Sitting: 165/69  Standing:    SaO2: 95%RA  MODE:  Ambulation: 550 ft   POST:  Rate/Rhythem: 80SR  BP:  Supine: 180/70,178/74  Sitting:   Standing:    SaO2: 93%RA 1020-1055 Pt walked 550 ft on RA with rolling walker and asst x 1. Requested rolling walker for home use. Discussed with [facility] PA. Pt tolerated well except increase in BP. Had just received morning meds. To bed after walk. Discussed CRP 2. Pt gave permission to refer to [facility] program.  Will ed today if wife present. Pt more awake today.  Duanne Limerick

## 2012-01-07 NOTE — Progress Notes (Signed)
[id number] Education completed with pt and family. Referring to [facility] CRP 2. Amisadai Woodford DunlapRN

## 2012-01-08 LAB — GLUCOSE, CAPILLARY
Glucose-Capillary: 118 mg/dL — ABNORMAL HIGH (ref 70–99)
Glucose-Capillary: 127 mg/dL — ABNORMAL HIGH (ref 70–99)

## 2012-01-08 MED ORDER — POTASSIUM CHLORIDE 20 MEQ PO PACK: 20.0000 meq | PACK | Freq: Two times a day (BID) | ORAL | Status: DC

## 2012-01-08 NOTE — Progress Notes (Signed)
Clarified with PA that discharge plan was still in progress after patient's episode of nausea and vomiting earlier. PA willing to discharge patient as planned, however is also willing to let patient remain in hospital tonight if he states he doesn't feel well. Patient initially undecided, looks like he feels unwell. Decided to remain in hospital tonight with hope for discharge tomorrow. Will cancel order to discharge patient. Harlow Asa

## 2012-01-08 NOTE — Progress Notes (Signed)
5 Days Post-Op Procedure(s) (LRB): REDO CORONARY ARTERY BYPASS GRAFTING (CABG) (N/A)  Subjective: Patient has no real complaints and wants to go home.  Objective: Vital signs in last 24 hours: Patient Vitals for the past 24 hrs:  BP Temp Temp src Pulse Resp SpO2 Weight  01/08/12 0500 138/68 mmHg 98.7 F (37.1 C) Oral 67  19  94 % 164 lb 9.6 oz (74.662 kg)  01/07/12 2156 139/73 mmHg 98.2 F (36.8 C) Oral 74  19  95 % -  01/07/12 2128 124/64 mmHg - - 76  - - -  01/07/12 1400 129/66 mmHg 98.2 F (36.8 C) Oral 74  - 93 % -  01/07/12 1021 165/69 mmHg - - 77  - 95 % -   Pre op weight 69.4  kg Current Weight  01/08/12 164 lb 9.6 oz (74.662 kg)      Intake/Output from previous day: 06/28 0701 - 06/29 0700 In: 723 [P.O.:720; I.V.:3] Out: 640 [Urine:640]   Physical Exam:  Cardiovascular: RRR, no murmurs, gallops, or rubs. Pulmonary: Clear to auscultation bilaterally; no rales, wheezes, or rhonchi. Abdomen: Soft, non tender, bowel sounds present. Extremities: Mild bilateral lower extremity edema. Wounds: Clean and dry. No signs of infection.  Lab Results: CBC:  Basename 01/06/12 0410  WBC 12.6*  HGB 9.0*  HCT 26.1*  PLT 114*   BMET:   Basename 01/06/12 0410  NA 137  K 3.7  CL 103  CO2 23  GLUCOSE 90  BUN 25*  CREATININE 1.38*  CALCIUM 9.0      Assessment/Plan:  1. CV - SR.Continue,Lopressor 25 bid,Clonidine 0.2 mg bid, and Cozaar 50 mg po daily.Per cardiology, will resume Amiodarone 100 mg po daily. 2.  Pulmonary - Encourage incentive spirometer. 3. Volume Overload - Continue with diuresis. 4.  Acute blood loss anemia - Last H and H stable at 9 and 26.1. 5.DM-CBGs 102/126/90 .HGA1C 5.9.Continue Metformin. 6.Thrombocytopenia-Platelets increased to 114,000. 7.Remove EPW and CT sutures. 8.Discharge today.  Tanay Massiah MPA-C 01/08/2012

## 2012-01-08 NOTE — Progress Notes (Signed)
EPW's and Ct sutures removed per order and unit protocol.   All tips intact, sites painted, steri's applied.  Pt tolerated very well.  HOB at 30 degress, bed rest for one hour.  VSS 136/59 HR 76.  Call bell in reach.  MT aware,  Will monitor closely.

## 2012-01-08 NOTE — Progress Notes (Signed)
Nausea and vomitting.  Attempted IV zofran per order but IV occluded.  PO zofran given instead.  Pt states "I think that part is over."  Family at bedside.  Will notify PA before DCing as planned.

## 2012-01-08 NOTE — Progress Notes (Signed)
Came to walk pt but sts he is too tired after just finishing bath. Had walked with family earlier. Encouraged x2 more baths tonight.  Ethelda Chick CES, ACSM

## 2012-01-08 NOTE — Progress Notes (Signed)
Pt encouraged to walk but declined, stating "I'm too tired."  Will encourage night RN to offer again.

## 2012-01-08 NOTE — Progress Notes (Signed)
SUBJECTIVE:  No complaints OBJECTIVE:   Vitals:   Filed Vitals:   01/07/12 1400 01/07/12 2128 01/07/12 2156 01/08/12 0500  BP: 129/66 124/64 139/73 138/68  Pulse: 74 76 74 67  Temp: 98.2 F (36.8 C)  98.2 F (36.8 C) 98.7 F (37.1 C)  TempSrc: Oral  Oral Oral  Resp:   19 19  Height:      Weight:    74.662 kg (164 lb 9.6 oz)  SpO2: 93%  95% 94%   I&O's:   Intake/Output Summary (Last 24 hours) at 01/08/12 6045 Last data filed at 01/07/12 2200  Gross per 24 hour  Intake    483 ml  Output    440 ml  Net     43 ml   TELEMETRY: Reviewed telemetry pt in NSR     PHYSICAL EXAM General: Well developed, well nourished, in no acute distress  Lungs:   Clear bilaterally to auscultation and percussion. Heart:   HRRR S1 S2 Pulses are 2+ & equal. Abdomen: Bowel sounds are positive, abdomen soft and non-tender without masses  Extremities:   No clubbing, cyanosis or edema.  DP +1 Neuro: Alert and oriented X 3. Psych:  Good affect, responds appropriately   LABS: Basic Metabolic Panel:  Basename 01/06/12 0410  NA 137  K 3.7  CL 103  CO2 23  GLUCOSE 90  BUN 25*  CREATININE 1.38*  CALCIUM 9.0  MG --  PHOS --   Liver Function Tests: No results found for this basename: AST:2,ALT:2,ALKPHOS:2,BILITOT:2,PROT:2,ALBUMIN:2 in the last 72 hours No results found for this basename: LIPASE:2,AMYLASE:2 in the last 72 hours CBC:  Basename 01/06/12 0410  WBC 12.6*  NEUTROABS --  HGB 9.0*  HCT 26.1*  MCV 92.2  PLT 114*   Coag Panel:   Lab Results  Component Value Date   INR 1.31 01/03/2012   INR 1.04 01/02/2012   INR 1.11 01/01/2012    RADIOLOGY: Dg Chest 2 View  12/25/2011  *RADIOLOGY REPORT*  Clinical Data: Preoperative examination (CABG)  CHEST - 2 VIEW  Comparison: 12/24/2011; 06/20/2009; chest CT - 07/02/2009  Findings: Grossly unchanged cardiac silhouette and mediastinal contours post median sternotomy and CABG.  Atherosclerotic calcifications within the aortic arch.  Stable  positioning of right anterior chest wall dual lead pacemaker with tips overlying the expected location of right atrium and ventricle.  The lungs remain hyperinflated with flattening of the bilateral hemidiaphragms.  Unchanged small right-sided effusion and right basilar opacities.  No new focal airspace opacities.  No pleural effusion.  Unchanged bones. Post cholecystectomy  IMPRESSION: Small right-sided effusion and right basilar opacities, possibly atelectasis.  Original Report Authenticated By: Waynard Reeds, M.D.   Dg Chest 2 View  12/24/2011  *RADIOLOGY REPORT*  Clinical Data: Chest pain  CHEST - 2 VIEW  Comparison: 12/23/2028  Findings: Linear lucency resolve with repeat radiograph.  Improved aeration with mild linear residual right lower lobe opacity. Prominent cardiomediastinal contours status post median sternotomy and CABG.  Aortic arch atherosclerosis.  Right chest wall battery pack with dual leads, unchanged in position. Multilevel degenerative change however no definite acute osseous finding.  IMPRESSION: No pneumothorax.  Mild right lung base scarring versus atelectasis.  Original Report Authenticated By: Waneta Martins, M.D.   Dg Chest Portable 1 View In Am  01/05/2012  *RADIOLOGY REPORT*  Clinical Data: Postop cardiac surgery.  PORTABLE CHEST - 1 VIEW  Comparison: 01/04/2012.  Findings: Trachea is midline.  Heart is enlarged, stable.  Thoracic aorta  is calcified.  Median sternotomy wires are unchanged in position.  Pacemaker lead tips project over the right atrium and right ventricle.  Left IJ Swan-Ganz catheter has been removed, with sheath remaining in place.  Chest tubes and mediastinal drains have been removed as well.  Epicardial pacer wires remain in place.  Mild diffuse bilateral air space disease, bibasilar predominant, with bilateral pleural effusions.  No pneumothorax.  IMPRESSION: Mild edema with bilateral pleural effusions and bibasilar atelectasis.  Original Report  Authenticated By: Reyes Ivan, M.D.   Dg Chest Portable 1 View In Am  01/04/2012  *RADIOLOGY REPORT*  Clinical Data: Postop.  PORTABLE CHEST - 1 VIEW  Comparison: 01/03/2012  Findings: Interval extubation and removal of NG tube.  Bilateral chest tubes, Swan Ganz catheter and right pacer remain in place, unchanged.  Bilateral effusions and bibasilar atelectasis. Cardiomegaly.  No pneumothorax.  No real change in the appearance of the lungs.  IMPRESSION: Interval extubation.  Otherwise no significant change.  No pneumothorax.  Original Report Authenticated By: Cyndie Chime, M.D.   Dg Chest Portable 1 View  01/03/2012  *RADIOLOGY REPORT*  Clinical Data: Postop.  PORTABLE CHEST - 1 VIEW  Comparison: 12/25/2011  Findings: Changes of prior CABG.  Right pacer remains in place, unchanged.  Swan-Ganz catheter in the main pulmonary artery. Bilateral chest tubes.  No pneumothorax. Endotracheal tube is 4 cm above the carina.  Bilateral perihilar and lower lobe opacities, with cardiomegaly and mild vascular congestion.  IMPRESSION: Postoperative changes with support devices in expected position. No pneumothorax.  Cardiomegaly, vascular congestion, areas of atelectasis bilaterally.  Original Report Authenticated By: Cyndie Chime, M.D.   Dg Chest Portable 1 View  12/24/2011  *RADIOLOGY REPORT*  Clinical Data: Chest pain, shortness of breath.  PORTABLE CHEST - 1 VIEW  Comparison: 07/02/2009 CT  Findings: Right chest wall battery pack with lead tips projecting over the right atrium and right ventricle.  Status post median sternotomy and CABG. Aortic arch atherosclerosis.  Lucency projecting over the left lung apex is favored to represent skin fold.  Linear right lung base opacity.  Mild left lung base opacity as well.  No definite pleural effusion.  No acute osseous finding.  IMPRESSION: Linear lucency projecting over the left upper lobe is favored to represent a skin fold however recommend PA and lateral  radiographs to exclude pneumothorax.  Prominent cardiomediastinal contours, similar to prior.  Bibasilar opacities; atelectasis versus infiltrate.  Discussed via telephone with Dr. Nicanor Alcon at 02:25 a.m. on 12/24/2011.  Original Report Authenticated By: Waneta Martins, M.D.      ASSESSMENT:  1.  CAD s/p redo CABG 2.  Atrial fibrillation maintaining NSR on amiodarone - off Coumadin due to anemia 3.  HTN stable  PLAN:   1. Consider sending home on home dose of Amio which is 100mg  daily at time of discharge  Quintella Reichert, MD  01/08/2012  8:12 AM

## 2012-01-09 LAB — GLUCOSE, CAPILLARY

## 2012-01-09 MED ORDER — AMIODARONE HCL 100 MG PO TABS
100.0000 mg | ORAL_TABLET | Freq: Every day | ORAL | Status: DC
  Administered 2012-01-09: 100 mg via ORAL
  Filled 2012-01-09: qty 1

## 2012-01-09 NOTE — Progress Notes (Addendum)
6 Days Post-Op Procedure(s) (LRB): REDO CORONARY ARTERY BYPASS GRAFTING (CABG) (N/A)  Subjective: Patient had an epeisode of nausea and emesis yesterday early afternoon.As a result, his discharge was held. He denies nausea, emesis, or abdominal pain this am. He would like to go home.  Objective: Vital signs in last 24 hours: Patient Vitals for the past 24 hrs:  BP Temp Temp src Pulse Resp SpO2 Weight  01/09/12 0500 - - - - - - 165 lb 11.2 oz (75.161 kg)  01/09/12 0436 115/65 mmHg 98.6 F (37 C) Oral 64  18  97 % -  01/08/12 2139 146/66 mmHg - - 72  - - -  01/08/12 2002 144/63 mmHg 100 F (37.8 C) Oral 77  17  95 % -  01/08/12 1500 127/67 mmHg 98.3 F (36.8 C) Oral 70  16  97 % -  01/08/12 1030 144/61 mmHg - - 81  - - -  01/08/12 1000 151/63 mmHg - - - - - -  01/08/12 0945 143/61 mmHg - - - - - -  01/08/12 0930 142/59 mmHg - - - - - -  01/08/12 0910 136/59 mmHg - - 76  - - -   Pre op weight 69.4  kg Current Weight  01/09/12 165 lb 11.2 oz (75.161 kg)      Intake/Output from previous day: 06/29 0701 - 06/30 0700 In: -  Out: 100 [Urine:100]   Physical Exam:  Cardiovascular: RRR, no murmurs, gallops, or rubs. Pulmonary:Slightly diminished at right base but left lung is clear; no rales, wheezes, or rhonchi. Abdomen: Soft, non tender, bowel sounds present. Extremities: Mild bilateral lower extremity edema. Wounds: Clean and dry. No signs of infection.  Lab Results: CBC: No results found for this basename: WBC:2,HGB:2,HCT:2,PLT:2 in the last 72 hours BMET:  No results found for this basename: NA:2,K:2,CL:2,CO2:2,GLUCOSE:2,BUN:2,CREATININE:2,CALCIUM:2 in the last 72 hours    Assessment/Plan:  1. CV - SR.Continue,Lopressor 25 bid,Clonidine 0.2 mg bid, and Cozaar 50 mg po daily.Per cardiology, will resume Amiodarone 100 mg po daily. 2.  Pulmonary - Encourage incentive spirometer. 3. Volume Overload - Continue with diuresis. 4.  Acute blood loss anemia - Last H and H  stable at 9 and 26.1. 5.DM-CBGs 118/127/99 .HGA1C 5.9.Continue Metformin. 6.Thrombocytopenia-Platelets increased to 114,000. 7.GI- one episode of emesis yesterday. No further nausea or emesis since then. 8.Discharge today.  [place] Jon MPA-C 01/09/2012

## 2012-01-28 ENCOUNTER — Other Ambulatory Visit: Payer: Self-pay | Admitting: Surgery

## 2012-01-28 DIAGNOSIS — I2510 Atherosclerotic heart disease of native coronary artery without angina pectoris: Secondary | ICD-10-CM

## 2012-02-01 ENCOUNTER — Ambulatory Visit
Admission: RE | Admit: 2012-02-01 | Discharge: 2012-02-01 | Disposition: A | Discharge status: Home / Self Care | Payer: Medicare Other | Source: Ambulatory Visit | Attending: Surgery | Admitting: Surgery

## 2012-02-01 ENCOUNTER — Ambulatory Visit (INDEPENDENT_AMBULATORY_CARE_PROVIDER_SITE_OTHER): Payer: Self-pay | Admitting: Surgery

## 2012-02-01 ENCOUNTER — Encounter: Payer: Self-pay | Admitting: Surgery

## 2012-02-01 VITALS — BP 140/70 | HR 60 | Resp 18 | Ht 70.0 in | Wt 161.0 lb

## 2012-02-01 DIAGNOSIS — I2510 Atherosclerotic heart disease of native coronary artery without angina pectoris: Secondary | ICD-10-CM

## 2012-02-01 DIAGNOSIS — Z951 Presence of aortocoronary bypass graft: Secondary | ICD-10-CM

## 2012-02-01 NOTE — Progress Notes (Signed)
[address]            [name] [address]          [phone]     HPI:  Patient returns for routine postoperative follow-up having undergone redo coronary bypass graft surgery x4 using bilateral internal mammary artery grafts on 01/03/2012. The patient's early postoperative recovery while in the hospital was notable for an uncomplicated postoperative course. Since hospital discharge the patient reports he has been progressing slowly but still feels tired and fatigued with exertion. He has mild incisional discomfort. He denies shortness of breath.   Current Outpatient Prescriptions  Medication Sig Dispense Refill  . acetaminophen (ARTHRITIS PAIN RELIEF) 650 MG CR tablet Take 1,300 mg by mouth 2 (two) times daily.       Marland Kitchen amiodarone (PACERONE) 100 MG tablet Take 1 tablet (100 mg total) by mouth daily.  30 tablet  1  . amLODipine (NORVASC) 5 MG tablet Take 5 mg by mouth daily.       Marland Kitchen atorvastatin (LIPITOR) 80 MG tablet Take 80 mg by mouth daily.      Marland Kitchen CALCIUM PO Take 2,000 mg by mouth daily.       . cholecalciferol (VITAMIN D) 1000 UNITS tablet Take 1,000 Units by mouth daily.       . cloNIDine (CATAPRES) 0.2 MG tablet Take 0.2 mg by mouth 2 (two) times daily.       . ferrous sulfate 325 (65 FE) MG tablet Take 325 mg by mouth 2 (two) times daily.      . furosemide (LASIX) 80 MG tablet Take 40 mg by mouth daily.       Marland Kitchen Leuprolide Acetate (LUPRON DEPOT IM) Inject into the muscle See admin instructions. Every 4 months as needed      . losartan (COZAAR) 50 MG tablet Take 1 tablet (50 mg total) by mouth daily.  30 tablet  1  . metFORMIN (GLUCOPHAGE) 500 MG tablet Take 1,000 mg by mouth 2 (two) times daily with a meal.       . metoprolol tartrate (LOPRESSOR) 25 MG tablet Take 1 tablet (25 mg total) by mouth 2 (two) times daily.  60 tablet  1  . omeprazole (PRILOSEC) 20 MG capsule Take 20 mg by mouth daily.       . Pancrelipase, Lip-Prot-Amyl, 5000 UNITS  CPEP Take 2 capsules by mouth 3 (three) times daily.       . potassium chloride (KLOR-CON) 20 MEQ packet Take 20 mEq by mouth 2 (two) times daily.      . silodosin (RAPAFLO) 8 MG CAPS capsule Take 8 mg by mouth daily with breakfast.      . traMADol (ULTRAM) 50 MG tablet 50 mg every 6 (six) hours as needed.         Physical Exam: BP 140/70  Pulse 60  Resp 18  Ht 5\' 10"  (1.778 m)  Wt 161 lb (73.029 kg)  BMI 23.10 kg/m2  SpO2 98% He looks well. Cardiac exam shows a regular rate and rhythm with normal heart sounds. Lung exam is clear. Chest incision is healing well and sternum stable. His leg incisions are healing well. There is mild bilateral lower leg edema.    Impression:  He is recovering slowly following redo coronary bypass graft surgery. He complains of tiredness and fatigue which could be medication related especially with a heart  rate of 60. He is only one month out from surgery and I would expect this to gradually improve. 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  Plan:  He'll continue followup with Dr. [name] and will contact me if he develops any problems with his incisions.

## 2012-04-28 ENCOUNTER — Encounter: Payer: Self-pay | Admitting: Surgery

## 2012-05-01 ENCOUNTER — Encounter: Payer: Self-pay | Admitting: Surgery

## 2012-05-01 ENCOUNTER — Other Ambulatory Visit (INDEPENDENT_AMBULATORY_CARE_PROVIDER_SITE_OTHER): Payer: Medicare Other | Admitting: *Deleted

## 2012-05-01 ENCOUNTER — Ambulatory Visit (INDEPENDENT_AMBULATORY_CARE_PROVIDER_SITE_OTHER): Payer: Medicare Other | Admitting: Surgery

## 2012-05-01 VITALS — BP 156/71 | HR 58 | Resp 16 | Ht 70.0 in | Wt 152.0 lb

## 2012-05-01 DIAGNOSIS — I771 Stricture of artery: Secondary | ICD-10-CM

## 2012-05-01 DIAGNOSIS — I739 Peripheral vascular disease, unspecified: Secondary | ICD-10-CM

## 2012-05-01 DIAGNOSIS — Z48812 Encounter for surgical aftercare following surgery on the circulatory system: Secondary | ICD-10-CM

## 2012-05-01 DIAGNOSIS — K551 Chronic vascular disorders of intestine: Secondary | ICD-10-CM | POA: Insufficient documentation

## 2012-05-01 NOTE — Progress Notes (Signed)
[facility] [place]   Patient name: [name] CARITHERS Sr. MRN: [id number] DOB: August 31, 1935 Sex: male     Chief Complaint  Patient presents with  . Re-evaluation    6 month f/u     HISTORY OF PRESENT ILLNESS: The patient is back today for followup of his mesenteric angina. He underwent arteriogram in June of this year to evaluate vague abdominal complaints. At that time he had an ultrasound which revealed elevated velocities within his superior mesenteric artery. He was awaiting redo coronary artery bypass graft at this time. Intraoperative findings included a patent celiac artery stent however there was stenosis at the distal extent extending into the splenic artery across the origin of the left gastric artery. The hepatic artery appeared to fill from the superior mesenteric or inferior mesenteric artery collaterals. The stent within the superior mesenteric artery was widely patent the artery itself was heavily calcified. The patient has since undergone redo coronary artery bypass graft. He has lost 16 pounds since his operation. He states that he does get pain with eating, however he also gets pain without eating. It is not very reproducible.  Past Medical History  Diagnosis Date  . Diabetes mellitus   . CAD (coronary artery disease)     s/b CABG '94  . Hypertension   . Anemia     Macrocytic  . Peripheral vascular disease   . Hyperlipidemia   . Cancer     prostate  . Ulcer   . GERD (gastroesophageal reflux disease)   . Liddle's syndrome   . Hemorrhoids   . Intestinal ischemia   . DVT (deep venous thrombosis)   . Atrial fibrillation     Paroxysmal. S/p PPM  . CHF (congestive heart failure)     Past Surgical History  Procedure Date  . Hiatal hernia repair     and ulcer repair  . Appendectomy   . Pacemaker insertion   . Coronary artery bypass graft 01/22/1993  . Cholecystectomy Oct 2009    Gall Bladder  . Coronary artery bypass graft 01/03/2012   Procedure: REDO CORONARY ARTERY BYPASS GRAFTING (CABG);  Surgeon: Alleen Borne, MD;  Location: [facility] OR;  Service: Open Heart Surgery;  Laterality: N/A;  Redo CABG x  using bilateral internal mammary arteries;  left leg greater saphenous vein harvested endoscopically    History   Social History  . Marital Status: Married    Spouse Name: N/A    Number of Children: N/A  . Years of Education: N/A   Occupational History  . Not on file.   Social History Main Topics  . Smoking status: Former Smoker    Types: Cigarettes    Quit date: 07/12/1977  . Smokeless tobacco: Never Used  . Alcohol Use: No  . Drug Use: No  . Sexually Active: Not on file   Other Topics Concern  . Not on file   Social History Narrative  . No narrative on file    Family History  Problem Relation Age of Onset  . Diabetes Mother   . Heart disease Mother     Heart Disease before age 91  . Hyperlipidemia Mother   . Hypertension Mother   . Cancer Father   . Cancer Sister   . Diabetes Sister   . Heart disease Daughter     Heart Disease before age 82  . Hypertension Daughter   . Heart attack Daughter     Allergies as of 05/01/2012 - Review Complete 05/01/2012  Allergen Reaction Noted  . Ace inhibitors Cough 05/12/2011  . Ibuprofen Other (See Comments) 05/12/2011    Current Outpatient Prescriptions on File Prior to Visit  Medication Sig Dispense Refill  . acetaminophen (ARTHRITIS PAIN RELIEF) 650 MG CR tablet Take 1,300 mg by mouth 2 (two) times daily.       Marland Kitchen amLODipine (NORVASC) 5 MG tablet Take 5 mg by mouth daily.       Marland Kitchen atorvastatin (LIPITOR) 80 MG tablet Take 80 mg by mouth daily.      Marland Kitchen CALCIUM PO Take 2,000 mg by mouth daily.       . cholecalciferol (VITAMIN D) 1000 UNITS tablet Take 1,000 Units by mouth daily.       . cloNIDine (CATAPRES) 0.2 MG tablet Take 0.2 mg by mouth 2 (two) times daily.       . ferrous sulfate 325 (65 FE) MG tablet Take 325 mg by mouth 2 (two) times daily.      .  furosemide (LASIX) 80 MG tablet Take 40 mg by mouth daily.       Marland Kitchen Leuprolide Acetate (LUPRON DEPOT IM) Inject into the muscle See admin instructions. Every 4 months as needed      . metFORMIN (GLUCOPHAGE) 500 MG tablet Take 1,000 mg by mouth 2 (two) times daily with a meal.       . omeprazole (PRILOSEC) 20 MG capsule Take 20 mg by mouth 2 (two) times daily.       . Pancrelipase, Lip-Prot-Amyl, 5000 UNITS CPEP Take 2 capsules by mouth 3 (three) times daily.       . potassium chloride (KLOR-CON) 20 MEQ packet Take 20 mEq by mouth 2 (two) times daily.      . silodosin (RAPAFLO) 8 MG CAPS capsule Take 8 mg by mouth daily.       . traMADol (ULTRAM) 50 MG tablet 50 mg every 6 (six) hours as needed.       Marland Kitchen DISCONTD: amiodarone (PACERONE) 100 MG tablet Take 1 tablet (100 mg total) by mouth daily.  30 tablet  1  . DISCONTD: losartan (COZAAR) 50 MG tablet Take 1 tablet (50 mg total) by mouth daily.  30 tablet  1  . DISCONTD: metoprolol tartrate (LOPRESSOR) 25 MG tablet Take 1 tablet (25 mg total) by mouth 2 (two) times daily.  60 tablet  1     REVIEW OF SYSTEMS: Please see history of present illness otherwise negative PHYSICAL EXAMINATION:   Vital signs are BP 156/71  Pulse 58  Resp 16  Ht 5\' 10"  (1.778 m)  Wt 152 lb (68.947 kg)  BMI 21.81 kg/m2  SpO2 100% General: The patient appears their stated age. HEENT:  No gross abnormalities Pulmonary:  Non labored breathing Abdomen: Soft and non-tender, diffuse abdominal bruits Musculoskeletal: There are no major deformities. Neurologic: No focal weakness or paresthesias are detected, Skin: There are no ulcer or rashes noted. Psychiatric: The patient has normal affect. Cardiovascular: There is a regular rate and rhythm without significant murmur appreciated. No carotid bruit   Diagnostic Studies Ultrasound was ordered and reviewed. This shows elevated velocities within the superior mesenteric artery, celiac artery, and inferior mesenteric  artery  Assessment: Chronic mesenteric angina Plan: Although the patient's symptoms are somewhat vague, I feel that most likely these are related to his mesenteric stenosis. For that reason I feel that we need to proceed with angiography and to intervene if possible. I will consider extending the celiac artery stent. I will also  consider re\re stenting the superior mesenteric artery with a atrium stent. A light also get better images of the inferior mesenteric artery. Because the patient is finishing up cardiac rehabilitation next week he would like to delay this. This procedure has been scheduled for Tuesday, November 5. He is now off of his Coumadin.  Jorge Ny, M.D. [facility] [place] Office: [phone] Pager:  [phone]

## 2012-05-09 ENCOUNTER — Other Ambulatory Visit: Payer: Self-pay

## 2012-05-16 ENCOUNTER — Encounter (HOSPITAL_COMMUNITY)
Admission: RE | Disposition: A | Discharge status: Home / Self Care | Payer: Self-pay | Source: Ambulatory Visit | Attending: Surgery

## 2012-05-16 ENCOUNTER — Observation Stay (HOSPITAL_COMMUNITY)
Admission: RE | Admit: 2012-05-16 | Discharge: 2012-05-19 | Disposition: A | Discharge status: Home / Self Care | Payer: Medicare Other | Source: Ambulatory Visit | Attending: Surgery | Admitting: Surgery

## 2012-05-16 DIAGNOSIS — I2510 Atherosclerotic heart disease of native coronary artery without angina pectoris: Secondary | ICD-10-CM | POA: Insufficient documentation

## 2012-05-16 DIAGNOSIS — I4891 Unspecified atrial fibrillation: Secondary | ICD-10-CM | POA: Insufficient documentation

## 2012-05-16 DIAGNOSIS — D649 Anemia, unspecified: Secondary | ICD-10-CM | POA: Insufficient documentation

## 2012-05-16 DIAGNOSIS — K551 Chronic vascular disorders of intestine: Secondary | ICD-10-CM

## 2012-05-16 DIAGNOSIS — E785 Hyperlipidemia, unspecified: Secondary | ICD-10-CM | POA: Insufficient documentation

## 2012-05-16 DIAGNOSIS — I774 Celiac artery compression syndrome: Secondary | ICD-10-CM | POA: Insufficient documentation

## 2012-05-16 DIAGNOSIS — E119 Type 2 diabetes mellitus without complications: Secondary | ICD-10-CM | POA: Insufficient documentation

## 2012-05-16 DIAGNOSIS — I998 Other disorder of circulatory system: Principal | ICD-10-CM | POA: Insufficient documentation

## 2012-05-16 DIAGNOSIS — K219 Gastro-esophageal reflux disease without esophagitis: Secondary | ICD-10-CM | POA: Insufficient documentation

## 2012-05-16 DIAGNOSIS — I509 Heart failure, unspecified: Secondary | ICD-10-CM | POA: Insufficient documentation

## 2012-05-16 DIAGNOSIS — K4090 Unilateral inguinal hernia, without obstruction or gangrene, not specified as recurrent: Secondary | ICD-10-CM | POA: Insufficient documentation

## 2012-05-16 DIAGNOSIS — Z86718 Personal history of other venous thrombosis and embolism: Secondary | ICD-10-CM | POA: Insufficient documentation

## 2012-05-16 DIAGNOSIS — I10 Essential (primary) hypertension: Secondary | ICD-10-CM | POA: Insufficient documentation

## 2012-05-16 HISTORY — PX: OTHER SURGICAL HISTORY: SHX169

## 2012-05-16 HISTORY — PX: CELIAC ARTERY ANGIOPLASTY: SHX1318

## 2012-05-16 HISTORY — PX: VISCERAL ANGIOGRAM: SHX5515

## 2012-05-16 LAB — POCT I-STAT, CHEM 8
Creatinine, Ser: 1.5 mg/dL — ABNORMAL HIGH (ref 0.50–1.35)
Glucose, Bld: 109 mg/dL — ABNORMAL HIGH (ref 70–99)
Hemoglobin: 9.9 g/dL — ABNORMAL LOW (ref 13.0–17.0)
Potassium: 3.3 mEq/L — ABNORMAL LOW (ref 3.5–5.1)

## 2012-05-16 LAB — POCT ACTIVATED CLOTTING TIME
Activated Clotting Time: 204 seconds
Activated Clotting Time: 184 seconds
Activated Clotting Time: 189 seconds

## 2012-05-16 LAB — CBC
HCT: 29.4 % — ABNORMAL LOW (ref 39.0–52.0)
MCHC: 33.3 g/dL (ref 30.0–36.0)
RDW: 14.5 % (ref 11.5–15.5)

## 2012-05-16 LAB — GLUCOSE, CAPILLARY: Glucose-Capillary: 107 mg/dL — ABNORMAL HIGH (ref 70–99)

## 2012-05-16 SURGERY — VISCERAL ANGIOGRAM
Anesthesia: LOCAL

## 2012-05-16 MED ORDER — ONDANSETRON HCL 4 MG/2ML IJ SOLN: 4.0000 mg | Freq: Four times a day (QID) | INTRAMUSCULAR | Status: DC | PRN

## 2012-05-16 MED ORDER — HYDRALAZINE HCL 20 MG/ML IJ SOLN
10.0000 mg | Freq: Once | INTRAMUSCULAR | Status: AC
  Administered 2012-05-16: 10 mg via INTRAVENOUS

## 2012-05-16 MED ORDER — PANTOPRAZOLE SODIUM 40 MG PO TBEC
40.0000 mg | DELAYED_RELEASE_TABLET | Freq: Every day | ORAL | Status: DC
  Administered 2012-05-17 – 2012-05-19 (×3): 40 mg via ORAL
  Filled 2012-05-16 (×3): qty 1

## 2012-05-16 MED ORDER — GUAIFENESIN-DM 100-10 MG/5ML PO SYRP: 15.0000 mL | ORAL_SOLUTION | ORAL | Status: DC | PRN

## 2012-05-16 MED ORDER — MORPHINE SULFATE 2 MG/ML IJ SOLN
INTRAMUSCULAR | Status: AC
  Filled 2012-05-16: qty 1

## 2012-05-16 MED ORDER — ASPIRIN 325 MG PO TABS
325.0000 mg | ORAL_TABLET | Freq: Every day | ORAL | Status: DC
  Administered 2012-05-17 – 2012-05-19 (×3): 325 mg via ORAL
  Filled 2012-05-16 (×3): qty 1

## 2012-05-16 MED ORDER — LABETALOL HCL 5 MG/ML IV SOLN: 10.0000 mg | INTRAVENOUS | Status: DC | PRN

## 2012-05-16 MED ORDER — MORPHINE SULFATE 10 MG/ML IJ SOLN
2.0000 mg | INTRAMUSCULAR | Status: DC | PRN
  Administered 2012-05-16: 4 mg via INTRAVENOUS
  Administered 2012-05-16 (×2): 2 mg via INTRAVENOUS
  Administered 2012-05-16: 4 mg via INTRAVENOUS

## 2012-05-16 MED ORDER — LOSARTAN POTASSIUM 50 MG PO TABS
100.0000 mg | ORAL_TABLET | Freq: Every day | ORAL | Status: DC
  Administered 2012-05-17 – 2012-05-19 (×3): 100 mg via ORAL
  Filled 2012-05-16 (×3): qty 2

## 2012-05-16 MED ORDER — ACETAMINOPHEN 325 MG PO TABS: 325.0000 mg | ORAL_TABLET | ORAL | Status: DC | PRN

## 2012-05-16 MED ORDER — MIDAZOLAM HCL 2 MG/2ML IJ SOLN
INTRAMUSCULAR | Status: AC
  Filled 2012-05-16: qty 2

## 2012-05-16 MED ORDER — OXYCODONE-ACETAMINOPHEN 5-325 MG PO TABS: 1.0000 | ORAL_TABLET | ORAL | Status: DC | PRN

## 2012-05-16 MED ORDER — METFORMIN HCL 500 MG PO TABS: 1000.0000 mg | ORAL_TABLET | Freq: Two times a day (BID) | ORAL | Status: DC

## 2012-05-16 MED ORDER — TAMSULOSIN HCL 0.4 MG PO CAPS
0.4000 mg | ORAL_CAPSULE | Freq: Every day | ORAL | Status: DC
  Administered 2012-05-17 – 2012-05-18 (×2): 0.4 mg via ORAL
  Filled 2012-05-16 (×4): qty 1

## 2012-05-16 MED ORDER — HEPARIN SODIUM (PORCINE) 1000 UNIT/ML IJ SOLN
INTRAMUSCULAR | Status: AC
  Filled 2012-05-16: qty 1

## 2012-05-16 MED ORDER — ACETAMINOPHEN 650 MG RE SUPP: 325.0000 mg | RECTAL | Status: DC | PRN

## 2012-05-16 MED ORDER — ATORVASTATIN CALCIUM 80 MG PO TABS
80.0000 mg | ORAL_TABLET | Freq: Every day | ORAL | Status: DC
  Administered 2012-05-17 – 2012-05-19 (×3): 80 mg via ORAL
  Filled 2012-05-16 (×3): qty 1

## 2012-05-16 MED ORDER — METOPROLOL TARTRATE 1 MG/ML IV SOLN: 2.0000 mg | INTRAVENOUS | Status: DC | PRN

## 2012-05-16 MED ORDER — SODIUM CHLORIDE 0.9 % IV SOLN
INTRAVENOUS | Status: DC
  Administered 2012-05-16: 09:00:00 via INTRAVENOUS

## 2012-05-16 MED ORDER — PHENOL 1.4 % MT LIQD: 1.0000 | OROMUCOSAL | Status: DC | PRN

## 2012-05-16 MED ORDER — FERROUS SULFATE 325 (65 FE) MG PO TABS
325.0000 mg | ORAL_TABLET | Freq: Two times a day (BID) | ORAL | Status: DC
  Administered 2012-05-17 – 2012-05-19 (×5): 325 mg via ORAL
  Filled 2012-05-16 (×6): qty 1

## 2012-05-16 MED ORDER — LOSARTAN POTASSIUM 50 MG PO TABS: 100.0000 mg | ORAL_TABLET | Freq: Every day | ORAL | Status: DC

## 2012-05-16 MED ORDER — HYDRALAZINE HCL 20 MG/ML IJ SOLN
10.0000 mg | INTRAMUSCULAR | Status: DC | PRN
  Administered 2012-05-16: 18:00:00 via INTRAVENOUS
  Filled 2012-05-16: qty 1

## 2012-05-16 MED ORDER — SODIUM CHLORIDE 0.9 % IV SOLN
INTRAVENOUS | Status: DC
  Administered 2012-05-16 – 2012-05-18 (×2): via INTRAVENOUS

## 2012-05-16 MED ORDER — LIDOCAINE HCL (PF) 1 % IJ SOLN
INTRAMUSCULAR | Status: AC
  Filled 2012-05-16: qty 30

## 2012-05-16 MED ORDER — HYDRALAZINE HCL 20 MG/ML IJ SOLN: 10.0000 mg | INTRAMUSCULAR | Status: DC | PRN

## 2012-05-16 MED ORDER — POTASSIUM CHLORIDE CRYS ER 20 MEQ PO TBCR
20.0000 meq | EXTENDED_RELEASE_TABLET | Freq: Two times a day (BID) | ORAL | Status: DC
  Administered 2012-05-16 – 2012-05-19 (×6): 20 meq via ORAL
  Filled 2012-05-16 (×8): qty 1

## 2012-05-16 MED ORDER — METOPROLOL TARTRATE 12.5 MG HALF TABLET
12.5000 mg | ORAL_TABLET | Freq: Two times a day (BID) | ORAL | Status: DC
  Administered 2012-05-16 – 2012-05-19 (×6): 12.5 mg via ORAL
  Filled 2012-05-16 (×7): qty 1

## 2012-05-16 MED ORDER — ACETAMINOPHEN 325 MG RE SUPP
325.0000 mg | RECTAL | Status: DC | PRN
  Filled 2012-05-16: qty 2

## 2012-05-16 MED ORDER — INSULIN ASPART 100 UNIT/ML ~~LOC~~ SOLN
0.0000 [IU] | Freq: Three times a day (TID) | SUBCUTANEOUS | Status: DC
  Administered 2012-05-18: 3 [IU] via SUBCUTANEOUS

## 2012-05-16 MED ORDER — HEPARIN (PORCINE) IN NACL 2-0.9 UNIT/ML-% IJ SOLN
INTRAMUSCULAR | Status: AC
  Filled 2012-05-16: qty 1000

## 2012-05-16 MED ORDER — DOCUSATE SODIUM 100 MG PO CAPS
100.0000 mg | ORAL_CAPSULE | Freq: Every day | ORAL | Status: DC
  Administered 2012-05-17 – 2012-05-19 (×3): 100 mg via ORAL
  Filled 2012-05-16 (×3): qty 1

## 2012-05-16 MED ORDER — FUROSEMIDE 40 MG PO TABS
40.0000 mg | ORAL_TABLET | Freq: Every day | ORAL | Status: DC
  Administered 2012-05-17 – 2012-05-19 (×3): 40 mg via ORAL
  Filled 2012-05-16 (×3): qty 1

## 2012-05-16 MED ORDER — ALUM & MAG HYDROXIDE-SIMETH 200-200-20 MG/5ML PO SUSP: 15.0000 mL | ORAL | Status: DC | PRN

## 2012-05-16 MED ORDER — PHENOL 1.4 % MT LIQD
1.0000 | OROMUCOSAL | Status: DC | PRN
  Filled 2012-05-16: qty 177

## 2012-05-16 MED ORDER — POTASSIUM CHLORIDE 20 MEQ PO PACK
20.0000 meq | PACK | Freq: Two times a day (BID) | ORAL | Status: DC
  Filled 2012-05-16: qty 1

## 2012-05-16 MED ORDER — HYDRALAZINE HCL 20 MG/ML IJ SOLN
10.0000 mg | INTRAMUSCULAR | Status: DC | PRN
  Filled 2012-05-16: qty 1

## 2012-05-16 MED ORDER — FENTANYL CITRATE 0.05 MG/ML IJ SOLN
INTRAMUSCULAR | Status: AC
  Filled 2012-05-16: qty 2

## 2012-05-16 MED ORDER — MORPHINE SULFATE 2 MG/ML IJ SOLN
INTRAMUSCULAR | Status: AC
  Filled 2012-05-16: qty 2

## 2012-05-16 MED ORDER — CLONIDINE HCL 0.2 MG PO TABS
0.2000 mg | ORAL_TABLET | Freq: Two times a day (BID) | ORAL | Status: DC
  Administered 2012-05-16 – 2012-05-19 (×6): 0.2 mg via ORAL
  Filled 2012-05-16 (×8): qty 1

## 2012-05-16 MED ORDER — AMLODIPINE BESYLATE 5 MG PO TABS
5.0000 mg | ORAL_TABLET | Freq: Every day | ORAL | Status: DC
  Administered 2012-05-17 – 2012-05-19 (×3): 5 mg via ORAL
  Filled 2012-05-16 (×3): qty 1

## 2012-05-16 MED ORDER — AMIODARONE HCL 100 MG PO TABS
100.0000 mg | ORAL_TABLET | Freq: Every day | ORAL | Status: DC
  Administered 2012-05-17 – 2012-05-19 (×3): 100 mg via ORAL
  Filled 2012-05-16 (×3): qty 1

## 2012-05-16 MED ORDER — SODIUM CHLORIDE 0.9 % IV SOLN: 1.0000 mL/kg/h | INTRAVENOUS | Status: DC

## 2012-05-16 MED ORDER — SODIUM CHLORIDE 0.9 % IV SOLN: 500.0000 mL | Freq: Once | INTRAVENOUS | Status: AC | PRN

## 2012-05-16 NOTE — H&P (View-Only) (Signed)
[facility] [place]   Patient name: [name] D Lackman Sr. MRN: [id number] DOB: 10/13/1935 Sex: male     Chief Complaint  Patient presents with  . Re-evaluation    6 month f/u     HISTORY OF PRESENT ILLNESS: The patient is back today for followup of his mesenteric angina. He underwent arteriogram in June of this year to evaluate vague abdominal complaints. At that time he had an ultrasound which revealed elevated velocities within his superior mesenteric artery. He was awaiting redo coronary artery bypass graft at this time. Intraoperative findings included a patent celiac artery stent however there was stenosis at the distal extent extending into the splenic artery across the origin of the left gastric artery. The hepatic artery appeared to fill from the superior mesenteric or inferior mesenteric artery collaterals. The stent within the superior mesenteric artery was widely patent the artery itself was heavily calcified. The patient has since undergone redo coronary artery bypass graft. He has lost 16 pounds since his operation. He states that he does get pain with eating, however he also gets pain without eating. It is not very reproducible.  Past Medical History  Diagnosis Date  . Diabetes mellitus   . CAD (coronary artery disease)     s/b CABG '94  . Hypertension   . Anemia     Macrocytic  . Peripheral vascular disease   . Hyperlipidemia   . Cancer     prostate  . Ulcer   . GERD (gastroesophageal reflux disease)   . Liddle's syndrome   . Hemorrhoids   . Intestinal ischemia   . DVT (deep venous thrombosis)   . Atrial fibrillation     Paroxysmal. S/p PPM  . CHF (congestive heart failure)     Past Surgical History  Procedure Date  . Hiatal hernia repair     and ulcer repair  . Appendectomy   . Pacemaker insertion   . Coronary artery bypass graft 01/22/1993  . Cholecystectomy Oct 2009    Gall Bladder  . Coronary artery bypass graft 01/03/2012   Procedure: REDO CORONARY ARTERY BYPASS GRAFTING (CABG);  Surgeon: Bryan K Bartle, MD;  Location: [facility] OR;  Service: Open Heart Surgery;  Laterality: N/A;  Redo CABG x  using bilateral internal mammary arteries;  left leg greater saphenous vein harvested endoscopically    History   Social History  . Marital Status: Married    Spouse Name: N/A    Number of Children: N/A  . Years of Education: N/A   Occupational History  . Not on file.   Social History Main Topics  . Smoking status: Former Smoker    Types: Cigarettes    Quit date: 07/12/1977  . Smokeless tobacco: Never Used  . Alcohol Use: No  . Drug Use: No  . Sexually Active: Not on file   Other Topics Concern  . Not on file   Social History Narrative  . No narrative on file    Family History  Problem Relation Age of Onset  . Diabetes Mother   . Heart disease Mother     Heart Disease before age 60  . Hyperlipidemia Mother   . Hypertension Mother   . Cancer Father   . Cancer Sister   . Diabetes Sister   . Heart disease Daughter     Heart Disease before age 60  . Hypertension Daughter   . Heart attack Daughter     Allergies as of 05/01/2012 - Review Complete 05/01/2012    Allergen Reaction Noted  . Ace inhibitors Cough 05/12/2011  . Ibuprofen Other (See Comments) 05/12/2011    Current Outpatient Prescriptions on File Prior to Visit  Medication Sig Dispense Refill  . acetaminophen (ARTHRITIS PAIN RELIEF) 650 MG CR tablet Take 1,300 mg by mouth 2 (two) times daily.       . amLODipine (NORVASC) 5 MG tablet Take 5 mg by mouth daily.       . atorvastatin (LIPITOR) 80 MG tablet Take 80 mg by mouth daily.      . CALCIUM PO Take 2,000 mg by mouth daily.       . cholecalciferol (VITAMIN D) 1000 UNITS tablet Take 1,000 Units by mouth daily.       . cloNIDine (CATAPRES) 0.2 MG tablet Take 0.2 mg by mouth 2 (two) times daily.       . ferrous sulfate 325 (65 FE) MG tablet Take 325 mg by mouth 2 (two) times daily.      .  furosemide (LASIX) 80 MG tablet Take 40 mg by mouth daily.       . Leuprolide Acetate (LUPRON DEPOT IM) Inject into the muscle See admin instructions. Every 4 months as needed      . metFORMIN (GLUCOPHAGE) 500 MG tablet Take 1,000 mg by mouth 2 (two) times daily with a meal.       . omeprazole (PRILOSEC) 20 MG capsule Take 20 mg by mouth 2 (two) times daily.       . Pancrelipase, Lip-Prot-Amyl, 5000 UNITS CPEP Take 2 capsules by mouth 3 (three) times daily.       . potassium chloride (KLOR-CON) 20 MEQ packet Take 20 mEq by mouth 2 (two) times daily.      . silodosin (RAPAFLO) 8 MG CAPS capsule Take 8 mg by mouth daily.       . traMADol (ULTRAM) 50 MG tablet 50 mg every 6 (six) hours as needed.       . DISCONTD: amiodarone (PACERONE) 100 MG tablet Take 1 tablet (100 mg total) by mouth daily.  30 tablet  1  . DISCONTD: losartan (COZAAR) 50 MG tablet Take 1 tablet (50 mg total) by mouth daily.  30 tablet  1  . DISCONTD: metoprolol tartrate (LOPRESSOR) 25 MG tablet Take 1 tablet (25 mg total) by mouth 2 (two) times daily.  60 tablet  1     REVIEW OF SYSTEMS: Please see history of present illness otherwise negative PHYSICAL EXAMINATION:   Vital signs are BP 156/71  Pulse 58  Resp 16  Ht 5' 10" (1.778 m)  Wt 152 lb (68.947 kg)  BMI 21.81 kg/m2  SpO2 100% General: The patient appears their stated age. HEENT:  No gross abnormalities Pulmonary:  Non labored breathing Abdomen: Soft and non-tender, diffuse abdominal bruits Musculoskeletal: There are no major deformities. Neurologic: No focal weakness or paresthesias are detected, Skin: There are no ulcer or rashes noted. Psychiatric: The patient has normal affect. Cardiovascular: There is a regular rate and rhythm without significant murmur appreciated. No carotid bruit   Diagnostic Studies Ultrasound was ordered and reviewed. This shows elevated velocities within the superior mesenteric artery, celiac artery, and inferior mesenteric  artery  Assessment: Chronic mesenteric angina Plan: Although the patient's symptoms are somewhat vague, I feel that most likely these are related to his mesenteric stenosis. For that reason I feel that we need to proceed with angiography and to intervene if possible. I will consider extending the celiac artery stent. I will also   consider re\re stenting the superior mesenteric artery with a atrium stent. A light also get better images of the inferior mesenteric artery. Because the patient is finishing up cardiac rehabilitation next week he would like to delay this. This procedure has been scheduled for Tuesday, November 5. He is now off of his Coumadin.  V. Wells Royann Wildasin IV, M.D. [facility] [place] Office: [phone] Pager:  [phone]   

## 2012-05-16 NOTE — Progress Notes (Signed)
Patient arrived from cath lab with fem stop at 1615. Fem stop applied in cath lab before arrival. Assessed fem stop every 15 minutes and accessed at 1715 and a hard mass was noted above the pelvic bone but below the fem stop and was very tender to touch. The fem stop was still in place. Cath lab called and MD notified. Vitals stable. Pressure applied until cath lab staff and MD arrived. Will continue to monitor.   Gionna Polak, Charlaine Dalton RN

## 2012-05-16 NOTE — Op Note (Signed)
[facility] [place]  Patient name: TYR FRANCA Sr. MRN: [id number] DOB: 1936-03-30 Sex: male  05/16/2012 Pre-operative Diagnosis: Chronic mesenteric ischemia Post-operative diagnosis:  Same Surgeon:  Jorge Ny Procedure Performed:  1.  ultrasound access right common femoral artery  2.  abdominal aortogram  3.  first order catheterization (superior mesenteric artery)  4.  stent, celiac artery     Indications:  The patient has previously undergone superior mesenteric artery and celiac artery stenting for chronic mesenteric ischemia. He has recently been experiencing abdominal pain after needing an mild weight loss. Ultrasound identified increasing stenosis within his stents. He comes in today for further evaluation and possible intervention  Procedure:  The patient was identified in the holding area and taken to room 8.  The patient was then placed supine on the table and prepped and draped in the usual sterile fashion.  A time out was called.  Ultrasound was used to evaluate the right common femoral artery.  It was patent .  A digital ultrasound image was acquired.  A micropuncture needle was used to access the right common femoral artery under ultrasound guidance.  An 018 wire was advanced without resistance and a micropuncture sheath was placed.  The 018 wire was removed and a benson wire was placed.  The micropuncture sheath was exchanged for a 5 french sheath.  An omniflush catheter was advanced over the wire to the level of L-1.  An abdominal angiogram was obtained. Next, the celiac artery was cannulated using a JR 4 catheter and a celiac artery angiogram was performed. The superior mesenteric artery was then cannulated and a superior mesenteric artery angiogram was performed with the catheter in the proximal superior mesenteric artery Findings:   Aortogram:  No significant aortic stenosis is visualized. A majority of images were performed with the patient  in the lateral projection. Stents are visualized within the proximal superior mesenteric and celiac artery. They are patent. There does appear to be in-stent stenosis within the celiac artery. The distal branches all appear widely patent. The superior mesenteric artery is heavily calcified but no significant stenoses are identified.   celiac artery:  There is stenosis within the stent in the proximal celiac artery. This stenosis extends out into the distal celiac artery across the origin of the left gastric artery. This stenosis is approximately 80%.   superior mesenteric artery:  The stent within the proximal superior mesenteric artery is widely patent. The main trunk of the superior mesenteric artery is heavily calcified. No hemodynamically significant lesions are identified.   Intervention:  After the above images were obtained, the decision was made to proceed with intervention. The patient was fully heparinized. I had a very difficult time cannulating the superior mesenteric artery with a JR 4 catheter. I could get the catheter into the stent but the wire was difficult to advance. I felt this was likely due to 2 the stenosis within the proximal celiac artery as well as the tortuosity within the distal vessel. I was ultimately able to advance the wire into the splenic artery. I used a 4 x 2 balloon to redilate the lesion. With the balloon inflated I was able to get more wire purchase into the splenic artery. I elected to primarily stent the lesion. This did require intentional coverage of the left gastric artery. A 6 x 18 Herculink stent was successfully deployed spanning the entire previous stent and then extending just beyond the left gastric artery. The balloon was  taken to nominal pressure. A followup arteriogram revealed resolution of the stenosis within the celiac artery. At this point, the catheters and wires were removed. The patient taken the holding area for sheath pull once his coagulation profile  corrects.   Impression:  #1  Widely patent superior mesenteric artery stent, and main vessel.  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 This area was stented using a 6 x 18 Herculink stent. There was intentional coverage of the origin of the left gastric artery.    Juleen China, M.D. [facility] [place] Office: [phone] Pager:  [phone]

## 2012-05-16 NOTE — Progress Notes (Addendum)
Asked to come evaluate groin again.  Fem stop in place since I last saw him.  He is now in the ICU.  There has been an acute increase in the swelling in his scrotum and below the femoral stop.  I removed the fem stop and I personally applied manual continuous pressure from 5:50pm until 6:40pm.  We were aggressive about his pain control and blood pressure.  There has been no change in his swelling.  It still extends in to his scrotum and right lower quadrant.  It is not expanding.    The fem stop has been replaced as a precaution.  A CBC is pending.  He has been typed and crossed.  Because he remains stable, I do not think that a CT scan will add anything at this time.  If this continues to be a problem, he may require a trip to the OR for closure of the arteriotomy.  Will continue with aggressive blood pressure control. Hb 9.8 HCT 29.4 (pre-procedure Hb 9.9, Hct 29.0)  Wells Joycelyn Liska [phone]

## 2012-05-16 NOTE — Progress Notes (Signed)
PHARMACIST - PHYSICIAN COMMUNICATION  CONCERNING:  METFORMIN SAFE ADMINISTRATION POLICY  RECOMMENDATION: Metformin has been placed on DISCONTINUE (rejected order) STATUS and should be reordered only after any of the conditions below are ruled out.  Current safety recommendations include avoiding metformin for a minimum of 48 hours after the patient's exposure to intravenous contrast media.  DESCRIPTION:  The Pharmacy Committee has adopted a policy that restricts the use of metformin in hospitalized patients until all the contraindications to administration have been ruled out. Specific contraindications are: [x]  Serum creatinine ? 1.5 for males []  Serum creatinine ? 1.4 for females []  Shock, acute MI, sepsis, hypoxemia, dehydration []  Planned administration of intravenous iodinated contrast media []  Heart Failure patients with low EF []  Acute or chronic metabolic acidosis (including DKA)      Thank you, Harland German, Pharm D 05/16/2012 4:23 PM

## 2012-05-16 NOTE — Interval H&P Note (Signed)
History and Physical Interval Note:  05/16/2012 9:50 AM  [name] D Cathlean Cower Sr.  has presented today for surgery, with the diagnosis of ma  The various methods of treatment have been discussed with the patient and family. After consideration of risks, benefits and other options for treatment, the patient has consented to  Procedure(s) (LRB) with comments: VISCERAL ANGIOGRAM (N/A) as a surgical intervention .  The patient's history has been reviewed, patient examined, no change in status, stable for surgery.  I have reviewed the patient's chart and labs.  Questions were answered to the patient's satisfaction.     Julissa Browning IV, V. WELLS

## 2012-05-16 NOTE — Progress Notes (Signed)
Called to see patient regarding right groin hematoma.  Pressure had been held for 1 hour.  Fem stop now in place. Site appears stable.  Patient is very tender.  Patient is HD stable.  Will continue with fem-stop and admit patient overnight for observation with a CBC tonight and in the morning.   Durene Cal

## 2012-05-17 ENCOUNTER — Other Ambulatory Visit: Payer: Self-pay | Admitting: *Deleted

## 2012-05-17 ENCOUNTER — Encounter (HOSPITAL_COMMUNITY): Payer: Self-pay | Admitting: *Deleted

## 2012-05-17 DIAGNOSIS — IMO0002 Reserved for concepts with insufficient information to code with codable children: Secondary | ICD-10-CM

## 2012-05-17 DIAGNOSIS — Z48812 Encounter for surgical aftercare following surgery on the circulatory system: Secondary | ICD-10-CM

## 2012-05-17 DIAGNOSIS — I771 Stricture of artery: Secondary | ICD-10-CM

## 2012-05-17 LAB — GLUCOSE, CAPILLARY
Glucose-Capillary: 126 mg/dL — ABNORMAL HIGH (ref 70–99)
Glucose-Capillary: 84 mg/dL (ref 70–99)
Glucose-Capillary: 99 mg/dL (ref 70–99)
Glucose-Capillary: 123 mg/dL — ABNORMAL HIGH (ref 70–99)

## 2012-05-17 LAB — BASIC METABOLIC PANEL
BUN: 26 mg/dL — ABNORMAL HIGH (ref 6–23)
CO2: 32 mEq/L (ref 19–32)
Chloride: 101 mEq/L (ref 96–112)
Glucose, Bld: 187 mg/dL — ABNORMAL HIGH (ref 70–99)
Potassium: 2.6 mEq/L — CL (ref 3.5–5.1)
Sodium: 141 mEq/L (ref 135–145)

## 2012-05-17 LAB — CBC
MCH: 33.5 pg (ref 26.0–34.0)
Platelets: 99 10*3/uL — ABNORMAL LOW (ref 150–400)
RBC: 2.12 MIL/uL — ABNORMAL LOW (ref 4.22–5.81)
RDW: 14.5 % (ref 11.5–15.5)

## 2012-05-17 MED ORDER — POTASSIUM CHLORIDE CRYS ER 20 MEQ PO TBCR
40.0000 meq | EXTENDED_RELEASE_TABLET | Freq: Once | ORAL | Status: AC
Start: 2012-05-17 — End: 2012-05-17
  Administered 2012-05-17: 40 meq via ORAL
  Filled 2012-05-17: qty 2

## 2012-05-17 NOTE — Progress Notes (Addendum)
[facility] [place]  Progress Note  Surgery  Date of Surgery: 05/16/2012  Procedure(s): VISCERAL ANGIOGRAM Surgeon: Surgeon(s): Nada Libman, MD  1 Day Post-Op  History of Present Illness  [name] Kwasnik Keaundre Thelin. is a 76 y.o. male who is S/P Procedure(s): stent, celiac artery with VISCERAL ANGIOGRAM.  Pt had post-op hematoma right groin with stable hemoglobin of 9.8 last PM. femstop removed at 9 pm and hematoma stable per RN.  HGB 7.1 this am  pt has no C/O pain in the area.    Significant Diagnostic Studies: CBC Lab Results  Component Value Date   WBC 5.7 05/17/2012   HGB 7.1* 05/17/2012   HCT 20.9* 05/17/2012   MCV 98.6 05/17/2012   PLT 99* 05/17/2012    BMET     Component Value Date/Time   NA 141 05/17/2012 0508   K 2.6* 05/17/2012 0508   CL 101 05/17/2012 0508   CO2 32 05/17/2012 0508   GLUCOSE 187* 05/17/2012 0508   BUN 26* 05/17/2012 0508   CREATININE 1.67* 05/17/2012 0508   CALCIUM 8.2* 05/17/2012 0508   GFRNONAA 39* 05/17/2012 0508   GFRAA 45* 05/17/2012 0508    COAG Lab Results  Component Value Date   INR 1.31 01/03/2012   INR 1.04 01/02/2012   INR 1.11 01/01/2012   No results found for this basename: PTT    Physical Examination  BP Readings from Last 3 Encounters:  05/17/12 119/44  05/17/12 119/44  05/01/12 156/71   Temp Readings from Last 3 Encounters:  05/17/12 97.8 F (36.6 C) Oral  05/17/12 97.8 F (36.6 C) Oral  01/09/12 98.6 F (37 C) Oral   SpO2 Readings from Last 3 Encounters:  05/17/12 98%  05/17/12 98%  05/01/12 100%   Pulse Readings from Last 3 Encounters:  05/17/12 61  05/17/12 61  05/01/12 58    Pt is A&O x 3 right lower extremity: Incision/s is/are clean,dry.intact, Right groin hematoma, appears unchanged. RLQ and groin soft, nontender. Limb is warm; with good color  Right Dorsalis Pedis pulse is monophasic by Doppler RightPosterior tibial pulse is  monophasic by Doppler  Left Dorsalis Pedis pulse is  monophasic by Doppler LeftPosterior tibial pulse is  monophasic by Doppler   Assessment/Plan: Pt. Doing well Post procedure acute Blood loss anemia - may transfuse today Hypokalemia - being replaced Post-op pain is controlled? Need for CT or to OR for groin exploration. Will keep NPO   Marlowe Shores [phone] 05/17/2012 7:36 AM   Right groin area unchanged.  Patients pain is better.  No ongoing signs of bleeding despite drop in Hb.  Recommend transfusion and continued observation today and tonight.  OK to eat  Durene Cal

## 2012-05-17 NOTE — Progress Notes (Signed)
Vascular MD on call, Dr. Edilia Bo updated on pt's drop in Hgb; post visceral angiogram pt's Hgb = 9.6; post procedure pt developed hematoma requiring femstop; Hgb this AM = 7.1

## 2012-05-17 NOTE — Progress Notes (Signed)
FemStop removed; area that sheath was inserted is soft and slightly bruised; however in lower right quadrant pt has defined hematoma; outside parameters are marked with marker; pressure dressing applied; area is very tender to touch; however, pt states that the back pain and groin pain is much better now that he was able to change positions slightly; will continue bedrest and monitor area for bleeding

## 2012-05-17 NOTE — Progress Notes (Signed)
CRITICAL VALUE ALERT  Critical value received:  K+ 2.9   Date of notification:  05/17/2012   Time of notification:  0630  Critical value read back:yes  Nurse who received alert:  Melody Delford Field RN  MD notified (1st page):  Pola Corn MD called before Vascular Service Called; new order received to replace K+

## 2012-05-17 NOTE — Progress Notes (Signed)
eLink Physician-Brief Progress Note Patient Name: [name] CHOCK Sr. DOB: Oct 26, 1935 MRN: [id number]  Date of Service  05/17/2012   HPI/Events of Note   Lab 05/17/12 0508 05/16/12 0917  NA 141 143  K 2.6* 3.3*  CL 101 98  CO2 32 --  GLUCOSE 187* 109*  BUN 26* 32*  CREATININE 1.67* 1.50*  CALCIUM 8.2* --  MG -- --  PHOS -- --   Estimated Creatinine Clearance: 38.4 ml/min (by C-G formula based on Cr of 1.67). Intake/Output      11/05 0701 - 11/06 0700   P.O. 240   I.V. (mL/kg) 791 (11.3)   Total Intake(mL/kg) 1031 (14.8)   Urine (mL/kg/hr) 650 (0.4)   Total Output 650   Net +381           eICU Interventions  Hypokalemia despite kcl bid that is given with lasix Noted low gfr  Plan kcl x 1 po   Intervention Category Intermediate Interventions: Electrolyte abnormality - evaluation and management  [name] 05/17/2012, 6:34 AM

## 2012-05-18 ENCOUNTER — Encounter (HOSPITAL_COMMUNITY): Payer: Self-pay | Admitting: Radiology

## 2012-05-18 ENCOUNTER — Observation Stay (HOSPITAL_COMMUNITY): Payer: Medicare Other

## 2012-05-18 LAB — GLUCOSE, CAPILLARY
Glucose-Capillary: 101 mg/dL — ABNORMAL HIGH (ref 70–99)
Glucose-Capillary: 157 mg/dL — ABNORMAL HIGH (ref 70–99)
Glucose-Capillary: 113 mg/dL — ABNORMAL HIGH (ref 70–99)

## 2012-05-18 LAB — CBC
MCHC: 34.1 g/dL (ref 30.0–36.0)
Platelets: 103 10*3/uL — ABNORMAL LOW (ref 150–400)
RDW: 17.4 % — ABNORMAL HIGH (ref 11.5–15.5)
WBC: 6.6 10*3/uL (ref 4.0–10.5)

## 2012-05-18 LAB — PREPARE RBC (CROSSMATCH)

## 2012-05-18 MED ORDER — CLOPIDOGREL BISULFATE 75 MG PO TABS: 75.0000 mg | ORAL_TABLET | Freq: Every day | ORAL | Status: DC

## 2012-05-18 MED ORDER — IOHEXOL 350 MG/ML SOLN
80.0000 mL | Freq: Once | INTRAVENOUS | Status: AC | PRN
  Administered 2012-05-18: 80 mL via INTRAVENOUS

## 2012-05-18 NOTE — Progress Notes (Signed)
[facility] [place]  Subjective  - POD #2  Patient feels better this morning.  He had no abdominal pain with eating   Physical Exam:  Right groin remains unchanged Good distal perfusion       Assessment/Plan:  POD #2  The patient responded to 2 units of blood yesterday.  His Hb increased from 7 to 8.5, however his Hct is only 25.  Therefore, I will give him an additional 2 units today and check a CTA to make sure there is no further ongoing bleeding.  If he looks good tomorrow and his CTA is negative for extravasation, he can be discharged to home.  He will need to go home on plavix  Loral Campi IV, V. WELLS 05/18/2012 8:04 PM --  Filed Vitals:   05/18/12 1745  BP:   Pulse: 59  Temp:   Resp:     Intake/Output Summary (Last 24 hours) at 05/18/12 2004 Last data filed at 05/18/12 1827  Gross per 24 hour  Intake   1950 ml  Output   1250 ml  Net    700 ml     Laboratory CBC    Component Value Date/Time   WBC 6.6 05/18/2012 0324   HGB 8.5* 05/18/2012 0324   HCT 24.9* 05/18/2012 0324   PLT 103* 05/18/2012 0324    BMET    Component Value Date/Time   NA 141 05/17/2012 0508   K 2.6* 05/17/2012 0508   CL 101 05/17/2012 0508   CO2 32 05/17/2012 0508   GLUCOSE 187* 05/17/2012 0508   BUN 26* 05/17/2012 0508   CREATININE 1.67* 05/17/2012 0508   CALCIUM 8.2* 05/17/2012 0508   GFRNONAA 39* 05/17/2012 0508   GFRAA 45* 05/17/2012 0508    COAG Lab Results  Component Value Date   INR 1.31 01/03/2012   INR 1.04 01/02/2012   INR 1.11 01/01/2012   No results found for this basename: PTT    Antibiotics Anti-infectives    None       V. Charlena Cross, M.D. [facility] [place] Office: [phone] Pager:  [phone]

## 2012-05-18 NOTE — Care Management Note (Signed)
    Page 1 of 1   05/18/2012     9:53:43 AM   CARE MANAGEMENT NOTE 05/18/2012  Patient:  [name], [name]   Account Number:  [id number]  Date Initiated:  05/18/2012  Documentation initiated by:  Junius Creamer  Subjective/Objective Assessment:   adm w groin hematoma     Action/Plan:   lives w wife, pcp dr Caryn Bee little   Anticipated DC Date:     Anticipated DC Plan:        DC Planning Services  CM consult      Choice offered to / List presented to:             Status of service:   Medicare Important Message given?   (If response is "NO", the following Medicare IM given date fields will be blank) Date Medicare IM given:   Date Additional Medicare IM given:    Discharge Disposition:  HOME/SELF CARE  Per UR Regulation:  Reviewed for med. necessity/level of care/duration of stay  If discussed at Long Length of Stay Meetings, dates discussed:    Comments:  11/7 9:53a debbie Travia Onstad rn,bsn [phone]

## 2012-05-19 ENCOUNTER — Telehealth: Payer: Self-pay | Admitting: Surgery

## 2012-05-19 LAB — TYPE AND SCREEN
ABO/RH(D): O POS
Unit division: 0

## 2012-05-19 LAB — GLUCOSE, CAPILLARY: Glucose-Capillary: 80 mg/dL (ref 70–99)

## 2012-05-19 LAB — BASIC METABOLIC PANEL
BUN: 23 mg/dL (ref 6–23)
Calcium: 8.4 mg/dL (ref 8.4–10.5)
Chloride: 105 mEq/L (ref 96–112)
Creatinine, Ser: 1.34 mg/dL (ref 0.50–1.35)
GFR calc Af Amer: 59 mL/min — ABNORMAL LOW (ref >90)

## 2012-05-19 LAB — CBC
HCT: 31.2 % — ABNORMAL LOW (ref 39.0–52.0)
MCHC: 34.6 g/dL (ref 30.0–36.0)
MCV: 92 fL (ref 78.0–100.0)
Platelets: 90 10*3/uL — ABNORMAL LOW (ref 150–400)
RDW: 17.5 % — ABNORMAL HIGH (ref 11.5–15.5)
WBC: 6.7 10*3/uL (ref 4.0–10.5)

## 2012-05-19 MED ORDER — POTASSIUM CHLORIDE 20 MEQ PO PACK: 20.0000 meq | PACK | Freq: Two times a day (BID) | ORAL | Status: DC

## 2012-05-19 MED ORDER — METFORMIN HCL 500 MG PO TABS: 1000.0000 mg | ORAL_TABLET | Freq: Two times a day (BID) | ORAL | Status: DC

## 2012-05-19 MED ORDER — OXYCODONE-ACETAMINOPHEN 5-325 MG PO TABS: 1.0000 | ORAL_TABLET | ORAL | Status: DC | PRN

## 2012-05-19 MED ORDER — POTASSIUM CHLORIDE CRYS ER 20 MEQ PO TBCR
40.0000 meq | EXTENDED_RELEASE_TABLET | Freq: Once | ORAL | Status: AC
  Administered 2012-05-19: 40 meq via ORAL
  Filled 2012-05-19: qty 2

## 2012-05-19 NOTE — Discharge Summary (Signed)
[facility] Discharge Summary   Patient ID:  [name] D Josten Sr. MRN: [id number] DOB/AGE: 02/23/1936 76 y.o.  Admit date: 05/16/2012 Discharge date: 05/19/2012 Date of Surgery: 05/16/2012 Surgeon: Surgeon(s): Vance W Brabham, MD  Admission Diagnosis: Mesenteric ischemia Discharge Diagnoses:  Mesenteric ischemia  Secondary Diagnoses: Past Medical History  Diagnosis Date  . Diabetes mellitus   . CAD (coronary artery disease)     s/b CABG '94  . Hypertension   . Anemia     Macrocytic  . Peripheral vascular disease   . Hyperlipidemia   . Cancer     prostate  . Ulcer   . GERD (gastroesophageal reflux disease)   . Liddle's syndrome   . Hemorrhoids   . Intestinal ischemia   . DVT (deep venous thrombosis)   . Atrial fibrillation     Paroxysmal. S/p PPM  . CHF (congestive heart failure)     Procedure(s): VISCERAL ANGIOGRAM 1. ultrasound access right common femoral artery  2. abdominal aortogram  3. first order catheterization (superior mesenteric artery)  4. stent, celiac artery     Discharged Condition: good  HPI: [name] D Maguire Sr. is a 76 y.o. 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 He underwent arteriogram in June of this year to evaluate vague abdominal complaints. At that time he had an ultrasound which revealed elevated velocities within his superior mesenteric artery. He was awaiting redo coronary artery bypass graft at this time. Intraoperative findings included a patent celiac artery stent however there was stenosis at the distal extent extending into the splenic artery across the origin of the left gastric artery. The hepatic artery appeared to fill from the superior mesenteric or inferior mesenteric artery collaterals. The stent within the superior mesenteric artery was widely patent the artery itself was heavily calcified. The patient has since undergone redo coronary artery bypass graft. He has lost 16 pounds since his operation.  He states that he does get pain with eating, however he also gets pain without eating. It is not very reproducible. Pt was admitted for aortogram and possible stenting  Hospital Course:  [name] D Haegele Sr. is a 76 y.o. male is S/P Procedure(s): VISCERAL ANGIOGRAM and celiac stent Post procedure the pt developed right groin hematoma and had symptomatic ascute blood loss anemia requiring a total of 4 UPC over 2 days CTA showed no pseudoaneurysm or extravasation of hematoma Post-op wounds healing well Pt. Ambulating, voiding and taking PO diet without difficulty. Pt pain controlled with PO pain meds. Labs as below Complications:hematoma; acute blood loss anemia  Consults:  Treatment Team:  Henry W Smith III, MD  Significant Diagnostic Studies: CBC Lab Results  Component Value Date   WBC 6.7 05/19/2012   HGB 10.8* 05/19/2012   HCT 31.2* 05/19/2012   MCV 92.0 05/19/2012   PLT 90* 05/19/2012    BMET    Component Value Date/Time   NA 142 05/19/2012 0435   K 2.5* 05/19/2012 0435   CL 105 05/19/2012 0435   CO2 28 05/19/2012 0435   GLUCOSE 98 05/19/2012 0435   BUN 23 05/19/2012 0435   CREATININE 1.34 05/19/2012 0435   CALCIUM 8.4 05/19/2012 0435   GFRNONAA 51* 05/19/2012 0435   GFRAA 59* 05/19/2012 0435   COAG Lab Results  Component Value Date   INR 1.31 01/03/2012   INR 1.04 01/02/2012   INR 1.11 01/01/2012     Disposition:  Discharge to :Home Discharge Orders    Future Orders Please Complete By   Expires   Resume previous diet      Driving Restrictions      Comments:   No driving for 1 weeks   Lifting restrictions      Comments:   No lifting for 2 weeks   Call MD for:  temperature >100.5      Call MD for:  redness, tenderness, or signs of infection (pain, swelling, bleeding, redness, odor or green/yellow discharge around incision site)      Call MD for:  severe or increased pain, loss or decreased feeling  in affected limb(s)      Increase activity slowly      Comments:    Walk with assistance use walker or cane as needed   May shower       No dressing needed      may wash over wound with mild soap and water         [name], [name]  Home Medication Instructions [id number]   Printed on:05/19/12 0820  Medication Information                    cloNIDine (CATAPRES) 0.2 MG tablet Take 0.2 mg by mouth 2 (two) times daily.            metFORMIN (GLUCOPHAGE) 500 MG tablet Take 1,000 mg by mouth 2 (two) times daily with a meal.            omeprazole (PRILOSEC) 20 MG capsule Take 20 mg by mouth 2 (two) times daily.            cholecalciferol (VITAMIN D) 1000 UNITS tablet Take 1,000 Units by mouth daily.            CALCIUM PO Take 2,000 mg by mouth daily.            acetaminophen (ARTHRITIS PAIN RELIEF) 650 MG CR tablet Take 1,300 mg by mouth 2 (two) times daily.            furosemide (LASIX) 80 MG tablet Take 40 mg by mouth daily.            atorvastatin (LIPITOR) 80 MG tablet Take 80 mg by mouth daily.           Leuprolide Acetate (LUPRON DEPOT IM) Inject into the muscle See admin instructions. Every 4 months as needed           ferrous sulfate 325 (65 FE) MG tablet Take 325 mg by mouth 2 (two) times daily.           silodosin (RAPAFLO) 8 MG CAPS capsule Take 8 mg by mouth daily.            potassium chloride (KLOR-CON) 20 MEQ packet Take 20 mEq by mouth 2 (two) times daily.           amLODipine (NORVASC) 5 MG tablet Take 5 mg by mouth daily.            metoprolol tartrate (LOPRESSOR) 25 MG tablet Take 12.5 mg by mouth 2 (two) times daily. Take 1/2 tab bid           amiodarone (PACERONE) 200 MG tablet Take 100 mg by mouth daily.           aspirin 325 MG tablet Take 325 mg by mouth daily.           losartan (COZAAR) 100 MG tablet Take 100 mg by mouth daily.           

## 2012-05-19 NOTE — Progress Notes (Signed)
CRITICAL VALUE ALERT  Critical value received:  K+ 2.5  Date of notification:  05-19-12  Time of notification: 0545  Critical value read back:yes  Nurse who received alert:  Sunday Corn RN  MD notified (1st page):  Deterding  Time of first page:  [phone]  MD notified (2nd page):  Time of second page:  Responding MD:  Deterding  Time MD responded:  [phone]

## 2012-05-19 NOTE — Progress Notes (Signed)
Pt and wife given discharge instructions and both verbalize understanding of instructions.  Pt groin level 0 and pt able to ambulate without equipment.  Pt vital signs within normal range and pt had no complaints of pain.  Pt escorted out of building via wheelchair to private vehicle.

## 2012-05-19 NOTE — Progress Notes (Addendum)
[facility] [place]  Progress Note  Surgery  Date of Surgery: 05/16/2012  Procedure(s): VISCERAL ANGIOGRAM with celiac stent Surgeon: Surgeon(s): Nada Libman, MD  3 Days Post-Op  History of Present Illness  [name]. is a 76 y.o. male who is S/P Procedure(s): VISCERAL ANGIOGRAM with post op right groin hematoma.   Patients pain is well controlled.   He is ambulating and voiding and having no difficulty eating.  Imaging: Ct Angio Abd/pel W/ And/or W/o  05/19/2012  *RADIOLOGY REPORT*  Clinical Data:  Evaluate for right groin pseudoaneurysm.  Hematoma.  CT ANGIOGRAPHY ABDOMEN AND PELVIS  Technique:  Multidetector CT imaging of the abdomen and pelvis was performed using the standard protocol during bolus administration of intravenous contrast.  Multiplanar reconstructed images including MIPs were obtained and reviewed to evaluate the vascular anatomy.  Contrast: 80mL OMNIPAQUE IOHEXOL 350 MG/ML SOLN  Comparison:  02/26/2010  Findings:  The aorta is nonaneurysmal and patent with extensive atherosclerotic vascular calcifications.  The proximal and origins of the celiac and SMA are stented.  There is low density within the stents worrisome for a significant amount of intimal hyperplasia.  Gross patency of the stents cannot be determined by this exam.  Beyond the stented portions of these vessels, branch vessels of the celiac axis opacify.  The SMA is also opacified with diffuse atherosclerotic changes.  Branch vessels are grossly patent.  The origin of the IMA is mildly diseased.  The vessel was hypertrophied and patent.  Atherosclerotic changes at the origin of both renal arteries is present.  Extensive calcification makes grading of stenosis difficult.  There is an enlarged lumbar artery posterolateral and to the right of the aorta on image 103.  The vessel is 8 mm in diameter.  This likely provides collateral flow.  This is a stable finding.  Atherosclerotic changes  of the common, external, and internal iliac arteries are noted.  Right common and external iliac arteries are patent with diffuse  atherosclerotic change.  The origin of the right internal iliac artery is severely diseased and there is post stenotic dilatation.  The origin of the left internal iliac artery is occluded.  Left common and external iliac arteries are patent.  Right common femoral artery is patent.  Right superficial femoral arteries occluded.  Similarly, the left common femoral artery is patent.  The left superficial femoral artery is occluded in the proximal thigh.  There is also significant disease at the origin of the left profunda femoral artery.  There is no evidence of pseudoaneurysm in the right groin.  There is thickening of the right abdominis rectus muscle as well as the right flank abdominal wall musculature either due to edema or soft tissue diffuse hematoma.  Diffuse subcutaneous and intraperitoneal fat edema is noted.  Several hypodensities scattered throughout the liver are stable and likely cysts.  Low lung volumes with mild bibasilar atelectasis.  Stable appearance of the spleen, kidneys, adrenal glands, pancreas. Post cholecystectomy with dilatation of the common bile duct.  Right inguinal hernia containing adipose tissue is unchanged.  Degenerative changes of the lumbar spine are noted.  Bladder is unremarkable.  Prostate is grossly within normal limits.   Review of the MIP images confirms the above findings.  IMPRESSION: No evidence of right inguinal pseudoaneurysm.  Minimal right inguinal hernia containing adipose tissue is noted.  Thickening of the lower right rectus muscle and right flank abdominal wall musculature likely due to soft tissue hematoma.  An  inflammatory process is a secondary consideration.  No evidence of contrast extravasation.  Extensive atherosclerotic changes of the visceral vasculature. There are stents at the origin of the celiac and SMA and intimal  hyperplasia within the stents is suspected.  If the patient has signs or symptoms related to mesenteric ischemia, dedicated visceral angiogram can be performed to further characterize as resolution on this study is suboptimal.   Original Report Authenticated By: Jolaine Click, M.D.     Significant Diagnostic Studies: CBC Lab Results  Component Value Date   WBC 6.7 05/19/2012   HGB 10.8* 05/19/2012   HCT 31.2* 05/19/2012   MCV 92.0 05/19/2012   PLT 90* 05/19/2012    BMET     Component Value Date/Time   NA 142 05/19/2012 0435   K 2.5* 05/19/2012 0435   CL 105 05/19/2012 0435   CO2 28 05/19/2012 0435   GLUCOSE 98 05/19/2012 0435   BUN 23 05/19/2012 0435   CREATININE 1.34 05/19/2012 0435   CALCIUM 8.4 05/19/2012 0435   GFRNONAA 51* 05/19/2012 0435   GFRAA 59* 05/19/2012 0435    COAG Lab Results  Component Value Date   INR 1.31 01/03/2012   INR 1.04 01/02/2012   INR 1.11 01/01/2012   No results found for this basename: PTT    Physical Examination  BP Readings from Last 3 Encounters:  05/19/12 165/79  05/19/12 165/79  05/01/12 156/71   Temp Readings from Last 3 Encounters:  05/19/12 97.8 F (36.6 C) Oral  05/19/12 97.8 F (36.6 C) Oral  01/09/12 98.6 F (37 C) Oral   SpO2 Readings from Last 3 Encounters:  05/19/12 99%  05/19/12 99%  05/01/12 100%   Pulse Readings from Last 3 Encounters:  05/19/12 60  05/19/12 60  05/01/12 58    Pt is A&O x 3 right lower extremity: Incision/s is/are clean,dry.intact, and  healing without hematoma, erythema or drainage Abdomen is soft with min unchanged RLQ tenderness Assessment/Plan: Pt. Doing well Post-op pain is controlled Acute blood loss anemia - stabl/improved after blood Home today on plavix Hypokalemia - replaced  Marlowe Shores [phone] 05/19/2012 8:25 AM  CT scan reviewed- some soft tissue hematoma. Hgb up to 10.8 OK for D/C today.  Di Kindle. Edilia Bo, MD, FACS Beeper [phone] 05/19/2012

## 2012-05-19 NOTE — Progress Notes (Deleted)
[facility] Discharge Summary   Patient ID:  [name] WINGERT Sr. MRN: [id number] DOB/AGE: 74/13/1937 76 y.o.  Admit date: 05/16/2012 Discharge date: 05/19/2012 Date of Surgery: 05/16/2012 Surgeon: Surgeon(s): Nada Libman, MD  Admission Diagnosis: Mesenteric ischemia Discharge Diagnoses:  Mesenteric ischemia  Secondary Diagnoses: Past Medical History  Diagnosis Date  . Diabetes mellitus   . CAD (coronary artery disease)     s/b CABG '94  . Hypertension   . Anemia     Macrocytic  . Peripheral vascular disease   . Hyperlipidemia   . Cancer     prostate  . Ulcer   . GERD (gastroesophageal reflux disease)   . Liddle's syndrome   . Hemorrhoids   . Intestinal ischemia   . DVT (deep venous thrombosis)   . Atrial fibrillation     Paroxysmal. S/p PPM  . CHF (congestive heart failure)     Procedure(s): VISCERAL ANGIOGRAM 1. ultrasound access right common femoral artery  2. abdominal aortogram  3. first order catheterization (superior mesenteric artery)  4. stent, celiac artery     Discharged Condition: good  HPI: [name]. is a 76 y.o. 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 He underwent arteriogram in June of this year to evaluate vague abdominal complaints. At that time he had an ultrasound which revealed elevated velocities within his superior mesenteric artery. He was awaiting redo coronary artery bypass graft at this time. Intraoperative findings included a patent celiac artery stent however there was stenosis at the distal extent extending into the splenic artery across the origin of the left gastric artery. The hepatic artery appeared to fill from the superior mesenteric or inferior mesenteric artery collaterals. The stent within the superior mesenteric artery was widely patent the artery itself was heavily calcified. The patient has since undergone redo coronary artery bypass graft. He has lost 16 pounds since his operation.  He states that he does get pain with eating, however he also gets pain without eating. It is not very reproducible. Pt was admitted for aortogram and possible stenting  Hospital Course:  [name]. is a 76 y.o. male is S/P Procedure(s): VISCERAL ANGIOGRAM and celiac stent Post procedure the pt developed right groin hematoma and had symptomatic ascute blood loss anemia requiring a total of 4 UPC over 2 days CTA showed no pseudoaneurysm or extravasation of hematoma Post-op wounds healing well Pt. Ambulating, voiding and taking PO diet without difficulty. Pt pain controlled with PO pain meds. Labs as below Complications:hematoma; acute blood loss anemia  Consults:  Treatment Team:  Lyn Records III, MD  Significant Diagnostic Studies: CBC Lab Results  Component Value Date   WBC 6.7 05/19/2012   HGB 10.8* 05/19/2012   HCT 31.2* 05/19/2012   MCV 92.0 05/19/2012   PLT 90* 05/19/2012    BMET    Component Value Date/Time   NA 142 05/19/2012 0435   K 2.5* 05/19/2012 0435   CL 105 05/19/2012 0435   CO2 28 05/19/2012 0435   GLUCOSE 98 05/19/2012 0435   BUN 23 05/19/2012 0435   CREATININE 1.34 05/19/2012 0435   CALCIUM 8.4 05/19/2012 0435   GFRNONAA 51* 05/19/2012 0435   GFRAA 59* 05/19/2012 0435   COAG Lab Results  Component Value Date   INR 1.31 01/03/2012   INR 1.04 01/02/2012   INR 1.11 01/01/2012     Disposition:  Discharge to :Home Discharge Orders    Future Orders Please Complete By  Expires   Resume previous diet      Driving Restrictions      Comments:   No driving for 1 weeks   Lifting restrictions      Comments:   No lifting for 2 weeks   Call MD for:  temperature >100.5      Call MD for:  redness, tenderness, or signs of infection (pain, swelling, bleeding, redness, odor or green/yellow discharge around incision site)      Call MD for:  severe or increased pain, loss or decreased feeling  in affected limb(s)      Increase activity slowly      Comments:    Walk with assistance use walker or cane as needed   May shower       No dressing needed      may wash over wound with mild soap and water         [name] Masson D Sr.  Home Medication Instructions [id number]   Printed on:05/19/12 0820  Medication Information                    cloNIDine (CATAPRES) 0.2 MG tablet Take 0.2 mg by mouth 2 (two) times daily.            metFORMIN (GLUCOPHAGE) 500 MG tablet Take 1,000 mg by mouth 2 (two) times daily with a meal.            omeprazole (PRILOSEC) 20 MG capsule Take 20 mg by mouth 2 (two) times daily.            cholecalciferol (VITAMIN D) 1000 UNITS tablet Take 1,000 Units by mouth daily.            CALCIUM PO Take 2,000 mg by mouth daily.            acetaminophen (ARTHRITIS PAIN RELIEF) 650 MG CR tablet Take 1,300 mg by mouth 2 (two) times daily.            furosemide (LASIX) 80 MG tablet Take 40 mg by mouth daily.            atorvastatin (LIPITOR) 80 MG tablet Take 80 mg by mouth daily.           Leuprolide Acetate (LUPRON DEPOT IM) Inject into the muscle See admin instructions. Every 4 months as needed           ferrous sulfate 325 (65 FE) MG tablet Take 325 mg by mouth 2 (two) times daily.           silodosin (RAPAFLO) 8 MG CAPS capsule Take 8 mg by mouth daily.            potassium chloride (KLOR-CON) 20 MEQ packet Take 20 mEq by mouth 2 (two) times daily.           amLODipine (NORVASC) 5 MG tablet Take 5 mg by mouth daily.            metoprolol tartrate (LOPRESSOR) 25 MG tablet Take 12.5 mg by mouth 2 (two) times daily. Take 1/2 tab bid           amiodarone (PACERONE) 200 MG tablet Take 100 mg by mouth daily.           aspirin 325 MG tablet Take 325 mg by mouth daily.           losartan (COZAAR) 100 MG tablet Take 100 mg by mouth daily.  clopidogrel (PLAVIX) 75 MG tablet Take 1 tablet (75 mg total) by mouth daily.           oxyCODONE-acetaminophen (PERCOCET/ROXICET) 5-325 MG per tablet Take  1-2 tablets by mouth every 4 (four) hours as needed.            Verbal and written Discharge instructions given to the patient. Wound care per Discharge AVS  F/U Dr. [name] 4 weeks  Signed: Marlowe Shores 05/19/2012, 8:20 AM

## 2012-05-19 NOTE — Telephone Encounter (Signed)
Message copied by Margaretmary Eddy on Fri May 19, 2012 11:45 AM ------      Message from: Melene Plan      Created: Fri May 19, 2012 10:25 AM       When do you want to see him and do you need studies?      Thanks      Darel Hong      ----- Message -----         From: Marlowe Shores, PA         Sent: 05/19/2012   8:17 AM           To: Melene Plan, RN            S/P celiac stent       ? When Brabham needs to be see him - pt had post-op bleeding            I put 4 weeks, also don't know if he needs any studies

## 2012-05-21 NOTE — Discharge Summary (Signed)
I agree with the above  Wells Brabham 

## 2012-05-22 ENCOUNTER — Telehealth: Payer: Self-pay | Admitting: Surgery

## 2012-05-22 NOTE — Telephone Encounter (Signed)
Message copied by Fredrich Birks on Mon May 22, 2012 10:42 AM ------      Message from: Melene Plan      Created: Mon May 22, 2012  9:45 AM                   ----- Message -----         From: Nada Libman, MD         Sent: 05/19/2012  11:58 PM           To: Melene Plan, RN            One week to follow up on hematoma then 3 months with abd u/s      ----- Message -----         From: Melene Plan, RN         Sent: 05/19/2012  10:25 AM           To: Nada Libman, MD, [facility]            When do you want to see him and do you need studies?      Thanks      Darel Hong      ----- Message -----         From: Marlowe Shores, PA         Sent: 05/19/2012   8:17 AM           To: Melene Plan, RN            S/P celiac stent       ? When Brabham needs to be see him - pt had post-op bleeding            I put 4 weeks, also don't know if he needs any studies

## 2012-05-22 NOTE — Telephone Encounter (Signed)
Spoke with patient to schedule both 1 week and 3 month follow ups. Patients appointments also mailed to home address, [place]

## 2012-05-26 ENCOUNTER — Encounter: Payer: Self-pay | Admitting: Surgery

## 2012-05-29 ENCOUNTER — Ambulatory Visit (INDEPENDENT_AMBULATORY_CARE_PROVIDER_SITE_OTHER): Payer: Medicare Other | Admitting: Surgery

## 2012-05-29 ENCOUNTER — Encounter: Payer: Self-pay | Admitting: Surgery

## 2012-05-29 VITALS — BP 156/78 | HR 59 | Temp 98.0°F | Resp 14 | Ht 70.0 in | Wt 152.8 lb

## 2012-05-29 DIAGNOSIS — K551 Chronic vascular disorders of intestine: Secondary | ICD-10-CM

## 2012-05-29 DIAGNOSIS — I771 Stricture of artery: Secondary | ICD-10-CM

## 2012-05-29 DIAGNOSIS — Z48812 Encounter for surgical aftercare following surgery on the circulatory system: Secondary | ICD-10-CM

## 2012-05-29 NOTE — Addendum Note (Signed)
Addended by: Sharee Pimple on: 05/29/2012 10:08 AM   Modules accepted: Orders

## 2012-05-29 NOTE — Progress Notes (Signed)
[facility] [place]   Patient name: [name] NAEEM Sr. MRN: [id number] DOB: [birth year] Sex: male     Chief Complaint  Patient presents with  . Follow-up    hematoma check    HISTORY OF PRESENT ILLNESS: The patient is back today for followup. 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 This area was treated by placing an additional stent was actually covered one of his major branch vessels. His postoperative course was complicated by a right groin hematoma. He received several units of blood while in the hospital. He was ultimately discharged home. Prior to discharge, I did obtain a CT angiogram which showed no evidence of pseudoaneurysm or extravasation. Today, the patient states that his abdominal symptoms have resolved. The right inguinal hematoma is getting smaller.  Past Medical History  Diagnosis Date  . Diabetes mellitus   . CAD (coronary artery disease)     s/b CABG '94  . Hypertension   . Anemia     Macrocytic  . Peripheral vascular disease   . Hyperlipidemia   . Cancer     prostate  . Ulcer   . GERD (gastroesophageal reflux disease)   . Liddle's syndrome   . Hemorrhoids   . Intestinal ischemia   . DVT (deep venous thrombosis)   . Atrial fibrillation     Paroxysmal. S/p PPM  . CHF (congestive heart failure)     Past Surgical History  Procedure Date  . Hiatal hernia repair     and ulcer repair  . Appendectomy   . Pacemaker insertion   . Coronary artery bypass graft 01/22/1993  . Cholecystectomy Oct 2009    Gall Bladder  . Coronary artery bypass graft 01/03/2012    Procedure: REDO CORONARY ARTERY BYPASS GRAFTING (CABG);  Surgeon: Alleen Borne, MD;  Location: [facility] OR;  Service: Open Heart Surgery;  Laterality: N/A;  Redo CABG x  using bilateral internal mammary arteries;  left leg greater saphenous vein harvested endoscopically  .  Celiac artery angioplasty 05-16-12    and stenting    History   Social History  . Marital Status: Married    Spouse Name: N/A    Number of Children: N/A  . Years of Education: N/A   Occupational History  . Not on file.   Social History Main Topics  . Smoking status: Former Smoker    Types: Cigarettes    Quit date: 07/12/1977  . Smokeless tobacco: Never Used  . Alcohol Use: No  . Drug Use: No  . Sexually Active: Not on file   Other Topics Concern  . Not on file   Social History Narrative  . No narrative on file    Family History  Problem Relation Age of Onset  . Diabetes Mother   . Heart disease Mother     Heart Disease before age 60  . Hyperlipidemia Mother   . Hypertension Mother   . Cancer Father   . Cancer Sister   . Diabetes Sister   . Heart disease Daughter     Heart Disease before age 49  . Hypertension Daughter   . Heart attack Daughter     Allergies as of 05/29/2012 - Review Complete 05/29/2012  Allergen Reaction Noted  . Ace inhibitors Cough 05/12/2011  . Ibuprofen Other (See Comments) 05/12/2011    Current Outpatient Prescriptions on File Prior to Visit  Medication Sig Dispense Refill  .  acetaminophen (ARTHRITIS PAIN RELIEF) 650 MG CR tablet Take 1,300 mg by mouth 2 (two) times daily.       Marland Kitchen amiodarone (PACERONE) 200 MG tablet Take 100 mg by mouth daily.      Marland Kitchen amLODipine (NORVASC) 5 MG tablet Take 5 mg by mouth daily.       Marland Kitchen aspirin 325 MG tablet Take 325 mg by mouth daily.      Marland Kitchen atorvastatin (LIPITOR) 80 MG tablet Take 80 mg by mouth daily.      Marland Kitchen CALCIUM PO Take 2,000 mg by mouth daily.       . cholecalciferol (VITAMIN D) 1000 UNITS tablet Take 1,000 Units by mouth daily.       . cloNIDine (CATAPRES) 0.2 MG tablet Take 0.2 mg by mouth 2 (two) times daily.       . clopidogrel (PLAVIX) 75 MG tablet Take 1 tablet (75 mg total) by mouth daily.  30 tablet  11  . ferrous sulfate 325 (65 FE) MG tablet Take 325 mg by mouth 2 (two) times daily.       . furosemide (LASIX) 80 MG tablet Take 40 mg by mouth daily.       Marland Kitchen Leuprolide Acetate (LUPRON DEPOT IM) Inject into the muscle See admin instructions. Every 4 months as needed      . losartan (COZAAR) 100 MG tablet Take 100 mg by mouth daily.      . metFORMIN (GLUCOPHAGE) 500 MG tablet Take 2 tablets (1,000 mg total) by mouth 2 (two) times daily with a meal.      . metoprolol tartrate (LOPRESSOR) 25 MG tablet Take 12.5 mg by mouth 2 (two) times daily. Take 1/2 tab bid      . omeprazole (PRILOSEC) 20 MG capsule Take 20 mg by mouth 2 (two) times daily.       Marland Kitchen oxyCODONE-acetaminophen (PERCOCET/ROXICET) 5-325 MG per tablet Take 1-2 tablets by mouth every 4 (four) hours as needed.  30 tablet  0  . potassium chloride (KLOR-CON) 20 MEQ packet Take 20 mEq by mouth 2 (two) times daily.      . silodosin (RAPAFLO) 8 MG CAPS capsule Take 8 mg by mouth daily.          REVIEW OF SYSTEMS: Cardiovascular: No chest pain, chest pressure, palpitations. Pulmonary: No productive cough, asthma or wheezing. Neurologic: No weakness, paresthesias, aphasia, or amaurosis. No dizziness. Hematologic: No bleeding problems or clotting disorders. Musculoskeletal: No joint pain or joint swelling. Gastrointestinal: No blood in stool or hematemesis Genitourinary: No dysuria or hematuria. Psychiatric:: No history of major depression. Integumentary: No rashes or ulcers. Constitutional: No fever or chills.  PHYSICAL EXAMINATION:   Vital signs are BP 156/78  Pulse 59  Temp 98 F (36.7 C) (Oral)  Resp 14  Ht 5\' 10"  (1.778 m)  Wt 152 lb 12.8 oz (69.31 kg)  BMI 21.92 kg/m2  SpO2 100% General: The patient appears their stated age. HEENT:  No gross abnormalities Pulmonary:  Non labored breathing Abdomen: Soft and non-tender Musculoskeletal: There are no major deformities. Neurologic: No focal weakness or paresthesias are detected, Skin: There are no ulcer or rashes noted. Psychiatric: The patient has normal  affect. Cardiovascular: Right groin bruit. Pedal pulses are not palpable.   Diagnostic Studies None  Assessment: Status post celiac artery stent Plan: The patient has had complete resolution of his mesenteric angina. His hematoma in the right groin is getting better. I suspect this will resolve over the next 2-3 weeks.  He will be scheduled to come back in 6 months with a repeat. He is on Plavix currently. I like for him to stay on this for at least 3 months, and potentially indefinite.  Jorge Ny, M.D. [facility] [place] Office: [phone] Pager:  [phone]

## 2012-06-19 ENCOUNTER — Ambulatory Visit: Payer: Medicare Other | Admitting: Surgery

## 2012-07-03 ENCOUNTER — Ambulatory Visit: Payer: Medicare Other | Admitting: Surgery

## 2012-07-03 ENCOUNTER — Other Ambulatory Visit: Payer: Medicare Other

## 2012-07-24 ENCOUNTER — Ambulatory Visit: Payer: Medicare Other | Admitting: Surgery

## 2012-07-24 ENCOUNTER — Other Ambulatory Visit: Payer: Medicare Other

## 2012-08-21 ENCOUNTER — Other Ambulatory Visit: Payer: Medicare Other

## 2012-08-21 ENCOUNTER — Ambulatory Visit: Payer: Medicare Other | Admitting: Surgery

## 2012-10-05 ENCOUNTER — Other Ambulatory Visit: Payer: Self-pay | Admitting: Family Medicine

## 2012-10-05 DIAGNOSIS — I10 Essential (primary) hypertension: Secondary | ICD-10-CM

## 2012-10-11 ENCOUNTER — Ambulatory Visit
Admission: RE | Admit: 2012-10-11 | Discharge: 2012-10-11 | Disposition: A | Discharge status: Home / Self Care | Payer: Medicare Other | Source: Ambulatory Visit | Attending: Family Medicine | Admitting: Family Medicine

## 2012-10-11 DIAGNOSIS — I10 Essential (primary) hypertension: Secondary | ICD-10-CM

## 2012-11-24 ENCOUNTER — Encounter: Payer: Self-pay | Admitting: Surgery

## 2012-11-27 ENCOUNTER — Other Ambulatory Visit (INDEPENDENT_AMBULATORY_CARE_PROVIDER_SITE_OTHER): Payer: Medicare Other | Admitting: Vascular Surgery

## 2012-11-27 ENCOUNTER — Ambulatory Visit: Payer: Medicare Other | Admitting: Neurosurgery

## 2012-11-27 ENCOUNTER — Encounter: Payer: Self-pay | Admitting: Surgery

## 2012-11-27 ENCOUNTER — Ambulatory Visit (INDEPENDENT_AMBULATORY_CARE_PROVIDER_SITE_OTHER): Payer: Medicare Other | Admitting: Surgery

## 2012-11-27 VITALS — BP 163/84 | HR 59 | Ht 70.0 in | Wt 160.0 lb

## 2012-11-27 DIAGNOSIS — I771 Stricture of artery: Secondary | ICD-10-CM

## 2012-11-27 DIAGNOSIS — K551 Chronic vascular disorders of intestine: Secondary | ICD-10-CM

## 2012-11-27 DIAGNOSIS — Z48812 Encounter for surgical aftercare following surgery on the circulatory system: Secondary | ICD-10-CM

## 2012-11-27 NOTE — Progress Notes (Signed)
[facility] [place]   Patient name: [name] FLEGEL Sr. MRN: [id number] DOB: 08-20-1935 Sex: male     Chief Complaint  Patient presents with  . Re-evaluation    6 month f /u mesenteric artery stenosis    HISTORY OF PRESENT ILLNESS: The patient is back today for followup of his chronic mesenteric ischemia. The patient has undergone multiple procedures in the past and has required stenting of both the celiac and superior mesenteric artery. He reports that he has no issues with the Donald pain. He is eating well. He does not have any significant weight loss. He does complain of pain in his left leg with walking. He denies rest pain or nonhealing wound. He also states that his blood pressure has been very difficult to control. A recent ultrasound suggest renal artery stenosis. He is on for current medications without adequate control. He no longer suffers from chest pain. He is recently status post redo coronary artery bypass grafting, one year ago. He is treated medically for his hypercholesterolemia with a statin.  Past Medical History  Diagnosis Date  . Diabetes mellitus   . CAD (coronary artery disease)     s/b CABG '94  . Hypertension   . Anemia     Macrocytic  . Peripheral vascular disease   . Hyperlipidemia   . Cancer     prostate  . Ulcer   . GERD (gastroesophageal reflux disease)   . Liddle's syndrome   . Hemorrhoids   . Intestinal ischemia   . DVT (deep venous thrombosis)   . Atrial fibrillation     Paroxysmal. S/p PPM  . CHF (congestive heart failure)     Past Surgical History  Procedure Laterality Date  . Hiatal hernia repair      and ulcer repair  . Appendectomy    . Pacemaker insertion    . Coronary artery bypass graft  01/22/1993  . Cholecystectomy  Oct 2009    Gall Bladder  . Coronary artery bypass graft  01/03/2012    Procedure: REDO CORONARY ARTERY BYPASS GRAFTING (CABG);  Surgeon: Alleen Borne, MD;  Location: [facility] OR;  Service: Open  Heart Surgery;  Laterality: N/A;  Redo CABG x  using bilateral internal mammary arteries;  left leg greater saphenous vein harvested endoscopically  . Celiac artery angioplasty  05-16-12    and stenting    History   Social History  . Marital Status: Married    Spouse Name: N/A    Number of Children: N/A  . Years of Education: N/A   Occupational History  . Not on file.   Social History Main Topics  . Smoking status: Former Smoker    Types: Cigarettes    Quit date: 07/12/1977  . Smokeless tobacco: Never Used  . Alcohol Use: No  . Drug Use: No  . Sexually Active: Not on file   Other Topics Concern  . Not on file   Social History Narrative  . No narrative on file    Family History  Problem Relation Age of Onset  . Diabetes Mother   . Heart disease Mother     Heart Disease before age 48  . Hyperlipidemia Mother   . Hypertension Mother   . Cancer Father   . Cancer Sister   . Diabetes Sister   . Heart disease Daughter     Heart Disease before age 72  . Hypertension Daughter   . Heart attack Daughter     Allergies  as of 11/27/2012 - Review Complete 11/27/2012  Allergen Reaction Noted  . Ace inhibitors Cough 05/12/2011  . Ibuprofen Other (See Comments) 05/12/2011    Current Outpatient Prescriptions on File Prior to Visit  Medication Sig Dispense Refill  . acetaminophen (ARTHRITIS PAIN RELIEF) 650 MG CR tablet Take 1,300 mg by mouth 2 (two) times daily.       Marland Kitchen amiodarone (PACERONE) 200 MG tablet Take 100 mg by mouth daily.      Marland Kitchen amLODipine (NORVASC) 5 MG tablet Take 5 mg by mouth daily.       Marland Kitchen aspirin 325 MG tablet Take 325 mg by mouth daily.      Marland Kitchen atorvastatin (LIPITOR) 80 MG tablet Take 80 mg by mouth daily.      Marland Kitchen CALCIUM PO Take 2,000 mg by mouth daily.       . cholecalciferol (VITAMIN D) 1000 UNITS tablet Take 1,000 Units by mouth daily.       . cloNIDine (CATAPRES) 0.2 MG tablet Take 0.2 mg by mouth 2 (two) times daily.       . clopidogrel (PLAVIX) 75 MG  tablet Take 1 tablet (75 mg total) by mouth daily.  30 tablet  11  . ferrous sulfate 325 (65 FE) MG tablet Take 325 mg by mouth 2 (two) times daily.      . furosemide (LASIX) 80 MG tablet Take 40 mg by mouth daily.       Marland Kitchen Leuprolide Acetate (LUPRON DEPOT IM) Inject into the muscle See admin instructions. Every 4 months as needed      . losartan (COZAAR) 100 MG tablet Take 100 mg by mouth daily.      . metFORMIN (GLUCOPHAGE) 500 MG tablet Take 2 tablets (1,000 mg total) by mouth 2 (two) times daily with a meal.      . metoprolol tartrate (LOPRESSOR) 25 MG tablet Take 12.5 mg by mouth 2 (two) times daily. Take 1/2 tab bid      . oxyCODONE-acetaminophen (PERCOCET/ROXICET) 5-325 MG per tablet Take 1-2 tablets by mouth every 4 (four) hours as needed.  30 tablet  0  . silodosin (RAPAFLO) 8 MG CAPS capsule Take 8 mg by mouth daily.       Marland Kitchen omeprazole (PRILOSEC) 20 MG capsule Take 20 mg by mouth 2 (two) times daily.        No current facility-administered medications on file prior to visit.     REVIEW OF SYSTEMS: Cardiovascular: No chest pain, chest pressure, palpitations, orthopnea, or dyspnea on exertion. Positive left greater than right pain with activity Pulmonary: No productive cough, asthma or wheezing. Neurologic: No weakness, paresthesias, aphasia, or amaurosis. No dizziness. Hematologic: History of left arm DVT secondary to pacemaker. He is no longer on Coumadin Musculoskeletal: No joint pain or joint swelling. Gastrointestinal: No blood in stool or hematemesis Genitourinary: No dysuria or hematuria. Psychiatric:: No history of major depression. Integumentary: No rashes or ulcers. Constitutional: No fever or chills.  PHYSICAL EXAMINATION:   Vital signs are BP 163/84  Pulse 59  Ht 5\' 10"  (1.778 m)  Wt 160 lb (72.576 kg)  BMI 22.96 kg/m2  SpO2 100% General: The patient appears their stated age. HEENT:  No gross abnormalities Pulmonary:  Non labored breathing Abdomen: Soft and  non-tender Musculoskeletal: There are no major deformities. Neurologic: No focal weakness or paresthesias are detected, Skin: There are no ulcer or rashes noted. Psychiatric: The patient has normal affect. Cardiovascular: There is a regular rate and rhythm without significant  murmur appreciated. Pedal pulses are not palpable   Diagnostic Studies Duplex ultrasound was performed today. This was a technically difficult study due to vessel calcification and acoustic shadowing. The superior mesenteric artery stent is patent with elevated velocities throughout. The celiac artery also has elevated velocities.  Assessment: #1: mesenteric stenosis #2: Peripheral vascular disease #3: Renal artery stenosis Plan: #1: The patient remained asymptomatic despite ultrasound findings of elevated velocities. The patient has recently undergone catheter angiography, less than one year ago. Because he has recurrent narrowing, I'm going to wait until he becomes symptomatic before recommending intervention. #2: The patient suffers from bilateral claudication, left greater than right. Based on angiographic imaging, it this is best treated with surgical bypass. The patient states that his symptoms are not severe enough for him to 1 to undergo surgery. #3: Renal artery stenosis was documented by ultrasound in the left renal artery. I have reviewed his previous arteriograms there was early stage atherosclerotic changes in the mid left renal artery. This is the area indicated by ultrasound. I have recommended proceeding with angiography and possible treatment of his possible renal artery stenosis. I did discuss with the patient that this may not have any impact on his blood pressure control, however it is also possible that it helps regulate him better. The patient would like to think about this for a while. I told him that if we proceeded with renal angiography and possible stenting, I would also evaluate his mesenteric  artery stents and intervene should I feel that it is warranted. The patient is going to contact me to schedule this appointment  V. Charlena Cross, M.D. [facility] [place] Office: [phone] Pager:  [phone]

## 2013-02-08 ENCOUNTER — Other Ambulatory Visit: Payer: Self-pay

## 2013-02-08 ENCOUNTER — Encounter (HOSPITAL_COMMUNITY): Payer: Self-pay | Admitting: Pharmacy Technician

## 2013-02-13 ENCOUNTER — Encounter (HOSPITAL_COMMUNITY)
Admission: RE | Disposition: A | Discharge status: Home / Self Care | Payer: Self-pay | Source: Ambulatory Visit | Attending: Surgery

## 2013-02-13 ENCOUNTER — Ambulatory Visit (HOSPITAL_COMMUNITY)
Admission: RE | Admit: 2013-02-13 | Discharge: 2013-02-13 | Disposition: A | Discharge status: Home / Self Care | Payer: Medicare Other | Source: Ambulatory Visit | Attending: Surgery | Admitting: Surgery

## 2013-02-13 ENCOUNTER — Telehealth: Payer: Self-pay | Admitting: Surgery

## 2013-02-13 DIAGNOSIS — Z888 Allergy status to other drugs, medicaments and biological substances status: Secondary | ICD-10-CM | POA: Insufficient documentation

## 2013-02-13 DIAGNOSIS — Y929 Unspecified place or not applicable: Secondary | ICD-10-CM | POA: Insufficient documentation

## 2013-02-13 DIAGNOSIS — D649 Anemia, unspecified: Secondary | ICD-10-CM | POA: Insufficient documentation

## 2013-02-13 DIAGNOSIS — T82898A Other specified complication of vascular prosthetic devices, implants and grafts, initial encounter: Secondary | ICD-10-CM | POA: Insufficient documentation

## 2013-02-13 DIAGNOSIS — Z886 Allergy status to analgesic agent status: Secondary | ICD-10-CM | POA: Insufficient documentation

## 2013-02-13 DIAGNOSIS — Z86718 Personal history of other venous thrombosis and embolism: Secondary | ICD-10-CM | POA: Insufficient documentation

## 2013-02-13 DIAGNOSIS — Z87891 Personal history of nicotine dependence: Secondary | ICD-10-CM | POA: Insufficient documentation

## 2013-02-13 DIAGNOSIS — Z7902 Long term (current) use of antithrombotics/antiplatelets: Secondary | ICD-10-CM | POA: Insufficient documentation

## 2013-02-13 DIAGNOSIS — I4891 Unspecified atrial fibrillation: Secondary | ICD-10-CM | POA: Insufficient documentation

## 2013-02-13 DIAGNOSIS — Z8249 Family history of ischemic heart disease and other diseases of the circulatory system: Secondary | ICD-10-CM | POA: Insufficient documentation

## 2013-02-13 DIAGNOSIS — E785 Hyperlipidemia, unspecified: Secondary | ICD-10-CM | POA: Insufficient documentation

## 2013-02-13 DIAGNOSIS — K219 Gastro-esophageal reflux disease without esophagitis: Secondary | ICD-10-CM | POA: Insufficient documentation

## 2013-02-13 DIAGNOSIS — K551 Chronic vascular disorders of intestine: Secondary | ICD-10-CM | POA: Insufficient documentation

## 2013-02-13 DIAGNOSIS — Z7982 Long term (current) use of aspirin: Secondary | ICD-10-CM | POA: Insufficient documentation

## 2013-02-13 DIAGNOSIS — Z951 Presence of aortocoronary bypass graft: Secondary | ICD-10-CM | POA: Insufficient documentation

## 2013-02-13 DIAGNOSIS — Y831 Surgical operation with implant of artificial internal device as the cause of abnormal reaction of the patient, or of later complication, without mention of misadventure at the time of the procedure: Secondary | ICD-10-CM | POA: Insufficient documentation

## 2013-02-13 DIAGNOSIS — I739 Peripheral vascular disease, unspecified: Secondary | ICD-10-CM | POA: Insufficient documentation

## 2013-02-13 DIAGNOSIS — I10 Essential (primary) hypertension: Secondary | ICD-10-CM | POA: Insufficient documentation

## 2013-02-13 DIAGNOSIS — Z791 Long term (current) use of non-steroidal anti-inflammatories (NSAID): Secondary | ICD-10-CM | POA: Insufficient documentation

## 2013-02-13 DIAGNOSIS — I2510 Atherosclerotic heart disease of native coronary artery without angina pectoris: Secondary | ICD-10-CM | POA: Insufficient documentation

## 2013-02-13 DIAGNOSIS — E119 Type 2 diabetes mellitus without complications: Secondary | ICD-10-CM | POA: Insufficient documentation

## 2013-02-13 DIAGNOSIS — Z79899 Other long term (current) drug therapy: Secondary | ICD-10-CM | POA: Insufficient documentation

## 2013-02-13 DIAGNOSIS — Z950 Presence of cardiac pacemaker: Secondary | ICD-10-CM | POA: Insufficient documentation

## 2013-02-13 DIAGNOSIS — Z8546 Personal history of malignant neoplasm of prostate: Secondary | ICD-10-CM | POA: Insufficient documentation

## 2013-02-13 DIAGNOSIS — I701 Atherosclerosis of renal artery: Secondary | ICD-10-CM

## 2013-02-13 DIAGNOSIS — I509 Heart failure, unspecified: Secondary | ICD-10-CM | POA: Insufficient documentation

## 2013-02-13 HISTORY — PX: RENAL ANGIOGRAM: SHX5509

## 2013-02-13 HISTORY — PX: ABDOMINAL ANGIOGRAM: SHX5499

## 2013-02-13 HISTORY — PX: OTHER SURGICAL HISTORY: SHX169

## 2013-02-13 HISTORY — PX: VISCERAL ANGIOGRAM: SHX5515

## 2013-02-13 LAB — POCT I-STAT, CHEM 8
Glucose, Bld: 97 mg/dL (ref 70–99)
HCT: 32 % — ABNORMAL LOW (ref 39.0–52.0)
Hemoglobin: 10.9 g/dL — ABNORMAL LOW (ref 13.0–17.0)
Potassium: 3.4 mEq/L — ABNORMAL LOW (ref 3.5–5.1)
Sodium: 144 mEq/L (ref 135–145)
TCO2: 30 mmol/L (ref 0–100)

## 2013-02-13 LAB — POCT ACTIVATED CLOTTING TIME: Activated Clotting Time: 206 seconds

## 2013-02-13 SURGERY — VISCERAL ANGIOGRAM
Anesthesia: LOCAL

## 2013-02-13 MED ORDER — HEPARIN (PORCINE) IN NACL 2-0.9 UNIT/ML-% IJ SOLN
INTRAMUSCULAR | Status: AC
  Filled 2013-02-13: qty 1000

## 2013-02-13 MED ORDER — HEPARIN SODIUM (PORCINE) 1000 UNIT/ML IJ SOLN
INTRAMUSCULAR | Status: AC
  Filled 2013-02-13: qty 1

## 2013-02-13 MED ORDER — SODIUM CHLORIDE 0.9 % IV SOLN: INTRAVENOUS | Status: DC

## 2013-02-13 MED ORDER — PHENOL 1.4 % MT LIQD: 1.0000 | OROMUCOSAL | Status: DC | PRN

## 2013-02-13 MED ORDER — ACETAMINOPHEN 325 MG PO TABS: 325.0000 mg | ORAL_TABLET | ORAL | Status: DC | PRN

## 2013-02-13 MED ORDER — HYDRALAZINE HCL 20 MG/ML IJ SOLN: 10.0000 mg | INTRAMUSCULAR | Status: DC | PRN

## 2013-02-13 MED ORDER — FENTANYL CITRATE 0.05 MG/ML IJ SOLN
INTRAMUSCULAR | Status: AC
  Filled 2013-02-13: qty 2

## 2013-02-13 MED ORDER — SODIUM CHLORIDE 0.9 % IV SOLN
INTRAVENOUS | Status: DC
  Administered 2013-02-13: 11:00:00 via INTRAVENOUS

## 2013-02-13 MED ORDER — LABETALOL HCL 5 MG/ML IV SOLN: 10.0000 mg | INTRAVENOUS | Status: DC | PRN

## 2013-02-13 MED ORDER — MIDAZOLAM HCL 2 MG/2ML IJ SOLN
INTRAMUSCULAR | Status: AC
  Filled 2013-02-13: qty 2

## 2013-02-13 MED ORDER — METOPROLOL TARTRATE 1 MG/ML IV SOLN: 2.0000 mg | INTRAVENOUS | Status: DC | PRN

## 2013-02-13 MED ORDER — LIDOCAINE HCL (PF) 1 % IJ SOLN
INTRAMUSCULAR | Status: AC
  Filled 2013-02-13: qty 30

## 2013-02-13 MED ORDER — HYDRALAZINE HCL 20 MG/ML IJ SOLN
INTRAMUSCULAR | Status: AC
  Filled 2013-02-13: qty 1

## 2013-02-13 MED ORDER — ONDANSETRON HCL 4 MG/2ML IJ SOLN: 4.0000 mg | Freq: Four times a day (QID) | INTRAMUSCULAR | Status: DC | PRN

## 2013-02-13 MED ORDER — ACETAMINOPHEN 325 MG RE SUPP: 325.0000 mg | RECTAL | Status: DC | PRN

## 2013-02-13 MED ORDER — GUAIFENESIN-DM 100-10 MG/5ML PO SYRP: 15.0000 mL | ORAL_SOLUTION | ORAL | Status: DC | PRN

## 2013-02-13 MED ORDER — ALUM & MAG HYDROXIDE-SIMETH 200-200-20 MG/5ML PO SUSP: 15.0000 mL | ORAL | Status: DC | PRN

## 2013-02-13 NOTE — H&P (Signed)
[facility] of [place]     Patient name: [name] SOUDER Sr.   MRN: [id number]        DOB: [birth year]          Sex: male       Chief Complaint   Patient presents with   .  Re-evaluation       6 month f /u mesenteric artery stenosis        HISTORY OF PRESENT ILLNESS: The patient is back today for followup of his chronic mesenteric ischemia. The patient has undergone multiple procedures in the past and has required stenting of both the celiac and superior mesenteric artery. He reports that he has no issues with the Donald pain. He is eating well. He does not have any significant weight loss. He does complain of pain in his left leg with walking. He denies rest pain or nonhealing wound. He also states that his blood pressure has been very difficult to control. A recent ultrasound suggest renal artery stenosis. He is on for current medications without adequate control. He no longer suffers from chest pain. He is recently status post redo coronary artery bypass grafting, one year ago. He is treated medically for his hypercholesterolemia with a statin.    Past Medical History   Diagnosis  Date   .  Diabetes mellitus     .  CAD (coronary artery disease)         s/b CABG '94   .  Hypertension     .  Anemia         Macrocytic   .  Peripheral vascular disease     .  Hyperlipidemia     .  Cancer         prostate   .  Ulcer     .  GERD (gastroesophageal reflux disease)     .  Liddle's syndrome     .  Hemorrhoids     .  Intestinal ischemia     .  DVT (deep venous thrombosis)     .  Atrial fibrillation         Paroxysmal. S/p PPM   .  CHF (congestive heart failure)           Past Surgical History   Procedure  Laterality  Date   .  Hiatal hernia repair           and ulcer repair   .  Appendectomy       .  Pacemaker insertion       .  Coronary artery bypass graft    01/22/1993   .  Cholecystectomy    Oct 2009       Gall Bladder   .  Coronary artery bypass  graft    01/03/2012       Procedure: REDO CORONARY ARTERY BYPASS GRAFTING (CABG);  Surgeon: Alleen Borne, MD;  Location: [facility] OR;  Service: Open Heart Surgery;  Laterality: N/A;  Redo CABG x  using bilateral internal mammary arteries;  left leg greater saphenous vein harvested endoscopically   .  Celiac artery angioplasty    05-16-12       and stenting         History       Social History   .  Marital Status:  Married       Spouse Name:  N/A       Number of Children:  N/A   .  Years of Education:  N/A       Occupational History   .  Not on file.       Social History Main Topics   .  Smoking status:  Former Smoker       Types:  Cigarettes       Quit date:  07/12/1977   .  Smokeless tobacco:  Never Used   .  Alcohol Use:  No   .  Drug Use:  No   .  Sexually Active:  Not on file       Other Topics  Concern   .  Not on file       Social History Narrative   .  No narrative on file         Family History   Problem  Relation  Age of Onset   .  Diabetes  Mother     .  Heart disease  Mother         Heart Disease before age 45   .  Hyperlipidemia  Mother     .  Hypertension  Mother     .  Cancer  Father     .  Cancer  Sister     .  Diabetes  Sister     .  Heart disease  Daughter         Heart Disease before age 63   .  Hypertension  Daughter     .  Heart attack  Daughter           Allergies as of 11/27/2012 - Review Complete 11/27/2012   Allergen  Reaction  Noted   .  Ace inhibitors  Cough  05/12/2011   .  Ibuprofen  Other (See Comments)  05/12/2011         Current Outpatient Prescriptions on File Prior to Visit   Medication  Sig  Dispense  Refill   .  acetaminophen (ARTHRITIS PAIN RELIEF) 650 MG CR tablet  Take 1,300 mg by mouth 2 (two) times daily.          Marland Kitchen  amiodarone (PACERONE) 200 MG tablet  Take 100 mg by mouth daily.         Marland Kitchen  amLODipine (NORVASC) 5 MG tablet  Take 5 mg by mouth daily.          Marland Kitchen  aspirin 325 MG tablet  Take 325 mg by mouth  daily.         Marland Kitchen  atorvastatin (LIPITOR) 80 MG tablet  Take 80 mg by mouth daily.         Marland Kitchen  CALCIUM PO  Take 2,000 mg by mouth daily.          .  cholecalciferol (VITAMIN D) 1000 UNITS tablet  Take 1,000 Units by mouth daily.          .  cloNIDine (CATAPRES) 0.2 MG tablet  Take 0.2 mg by mouth 2 (two) times daily.          .  clopidogrel (PLAVIX) 75 MG tablet  Take 1 tablet (75 mg total) by mouth daily.   30 tablet   11   .  ferrous sulfate 325 (65 FE) MG tablet  Take 325 mg by mouth 2 (two) times daily.         .  furosemide (LASIX) 80 MG tablet  Take 40 mg by mouth daily.          Marland Kitchen  Leuprolide Acetate (  LUPRON DEPOT IM)  Inject into the muscle See admin instructions. Every 4 months as needed         .  losartan (COZAAR) 100 MG tablet  Take 100 mg by mouth daily.         .  metFORMIN (GLUCOPHAGE) 500 MG tablet  Take 2 tablets (1,000 mg total) by mouth 2 (two) times daily with a meal.         .  metoprolol tartrate (LOPRESSOR) 25 MG tablet  Take 12.5 mg by mouth 2 (two) times daily. Take 1/2 tab bid         .  oxyCODONE-acetaminophen (PERCOCET/ROXICET) 5-325 MG per tablet  Take 1-2 tablets by mouth every 4 (four) hours as needed.   30 tablet   0   .  silodosin (RAPAFLO) 8 MG CAPS capsule  Take 8 mg by mouth daily.          Marland Kitchen  omeprazole (PRILOSEC) 20 MG capsule  Take 20 mg by mouth 2 (two) times daily.              No current facility-administered medications on file prior to visit.          REVIEW OF SYSTEMS: Cardiovascular: No chest pain, chest pressure, palpitations, orthopnea, or dyspnea on exertion. Positive left greater than right pain with activity Pulmonary: No productive cough, asthma or wheezing. Neurologic: No weakness, paresthesias, aphasia, or amaurosis. No dizziness. Hematologic: History of left arm DVT secondary to pacemaker. He is no longer on Coumadin Musculoskeletal: No joint pain or joint swelling. Gastrointestinal: No blood in stool or hematemesis Genitourinary:  No dysuria or hematuria. Psychiatric:: No history of major depression. Integumentary: No rashes or ulcers. Constitutional: No fever or chills.   PHYSICAL EXAMINATION:   Vital signs are BP 163/84  Pulse 59  Ht 5\' 10"  (1.778 m)  Wt 160 lb (72.576 kg)  BMI 22.96 kg/m2  SpO2 100% General: The patient appears their stated age. HEENT:  No gross abnormalities Pulmonary:  Non labored breathing Abdomen: Soft and non-tender Musculoskeletal: There are no major deformities. Neurologic: No focal weakness or paresthesias are detected, Skin: There are no ulcer or rashes noted. Psychiatric: The patient has normal affect. Cardiovascular: There is a regular rate and rhythm without significant murmur appreciated. Pedal pulses are not palpable     Diagnostic Studies Duplex ultrasound was performed today. This was a technically difficult study due to vessel calcification and acoustic shadowing. The superior mesenteric artery stent is patent with elevated velocities throughout. The celiac artery also has elevated velocities.   Assessment: #1: mesenteric stenosis #2: Peripheral vascular disease #3: Renal artery stenosis Plan: #1: The patient remained asymptomatic despite ultrasound findings of elevated velocities. The patient has recently undergone catheter angiography, less than one year ago. Because he has recurrent narrowing, I'm going to wait until he becomes symptomatic before recommending intervention. #2: The patient suffers from bilateral claudication, left greater than right. Based on angiographic imaging, it this is best treated with surgical bypass. The patient states that his symptoms are not severe enough for him to 1 to undergo surgery. #3: Renal artery stenosis was documented by ultrasound in the left renal artery. I have reviewed his previous arteriograms there was early stage atherosclerotic changes in the mid left renal artery. This is the area indicated by ultrasound. I have  recommended proceeding with angiography and possible treatment of his possible renal artery stenosis. I did discuss with the patient that this may not have any impact  on his blood pressure control, however it is also possible that it helps regulate him better. The patient would like to think about this for a while. I told him that if we proceeded with renal angiography and possible stenting, I would also evaluate his mesenteric artery stents and intervene should I feel that it is warranted. The patient is going to contact me to schedule this appointment   V. Charlena Cross, M.D. [facility] [place] Office: [phone] Pager:  [phone]

## 2013-02-13 NOTE — Op Note (Signed)
[facility]  Patient name: [name] CAMEY Sr. MRN: [id number] DOB: 1935-10-10 Sex: male  02/13/2013 Pre-operative Diagnosis: #1: Possible renal artery stenosis, #2: Mesenteric stenosis Post-operative diagnosis:  Same Surgeon:  Jorge Ny Procedure Performed:  1.  ultrasound-guided access, left femoral artery  2.  abdominal aorta and the CO2  3.  first order catheterization (left renal artery)   4.  left renal artery angiogram  5.  first order catheterization (celiac artery)  6.  angioplasty, celiac artery  7.  first order catheterization (superior mesenteric artery)  8.  superior mesenteric artery angiogram   Indications:  The patient has a history of chronic mesenteric ischemia. He is undergone previous stenting of his superior mesenteric and celiac artery. He has had difficult control blood pressure as well as a decline in his renal function. Ultrasound identified a possible hemodynamically significant stenosis in his left renal artery. He comes in today for further evaluation and possible treatment. He does state he has had some difficulty with food tolerance lately.  Procedure:  The patient was identified in the holding area and taken to room 8.  The patient was then placed supine on the table and prepped and draped in the usual sterile fashion.  A time out was called.  Ultrasound was used to evaluate the left common femoral artery.  It was patent .  A digital ultrasound image was acquired.  A micropuncture needle was used to access the left common femoral artery under ultrasound guidance.  An 018 wire was advanced without resistance and a micropuncture sheath was placed.  The 018 wire was removed and a benson wire was placed.  The micropuncture sheath was exchanged for a 5 french sheath.  An omniflush catheter was advanced over the wire to the level of L-1.  An abdominal angiogram was obtained with CO2. The left renal artery was cannulated using a JR  4 catheter. Contrast injections were performed with the catheter the left renal artery. Similarly, the celiac artery was cannulated with a Cobra 1 catheter as well as the superior mesenteric artery. Celiac and superior mesenteric artery and 2 g were performed  Findings:   Aortogram:  The celiac and superior mesenteric artery opacified briskly with CO2. The right renal artery appears to be widely patent. Limited evaluation of the left renal artery was obtained with CO2. The infrarenal abdominal aorta is widely patent. It is calcified  Left renal artery:  Mild luminal irregularity is identified at the proximal to midportion of the main left renal artery. A pullback pressure across this area revealed no pressure gradient. Therefore this lesion is not hemodynamically significant  Celiac artery:  The major branches to the celiac artery opacified briskly. There is a slight haziness within the initially placed stents which possibly correlates to a significant stenosis. The miracle value could not be obtained.  Superior mesenteric artery: The superior mesenteric artery and stent appear to be widely patent  Intervention:  After the above images were acquired, the decision was made to proceed with intervention. After being heparinized, I elected to performed balloon angioplasty of the celiac artery stent. This was cannulated with a Cobra 1 catheter and a 035 [place] wire was advanced into the splenic artery. The Cobra 1 catheter was removed and the JR 4 guide catheter was advanced to the origin of the celiac artery stent. An 014 [place] core wire was buddy wired out into the splenic artery and the 035 wire removed. A 6 x  2 balloon was used to perform angioplasty of the celiac artery stents. Followup study revealed slightly improved opacification within the stent, suggesting that the stent is widely patent. At this point, the decision was made to terminate the procedure. Catheters and wires were removed. The patient  taken the holding area for sheath pull.  Impression:  #1  no hemodynamically significant stenosis identified within the left renal artery  #2  the right renal artery is widely patent  #3  successful angioplasty of in-stent stenosis of the celiac artery stent, using a 6 x 20 balloon  #4  widely patent superior mesenteric artery and stent    V. Durene Cal, M.D. [facility] [place] Office: [phone] Pager:  [phone]

## 2013-02-13 NOTE — Telephone Encounter (Addendum)
Message copied by Rosalyn Charters on Tue Feb 13, 2013  3:38 PM ------      Message from: Melene Plan      Created: Tue Feb 13, 2013  3:21 PM                   ----- Message -----         From: Nada Libman, MD         Sent: 02/13/2013   2:53 PM           To: Reuel Derby, Melene Plan, RN            02/13/2013:            Surgeon:  Jorge Ny      Procedure Performed:       1.  ultrasound-guided access, left femoral artery       2.  abdominal aorta and the CO2       3.  first order catheterization (left renal artery)        4.  left renal artery angiogram       5.  first order catheterization (celiac artery)       6.  angioplasty, celiac artery       7.  first order catheterization (superior mesenteric artery)       8.  superior mesenteric artery angiogram                  Schedule patient for followup in 6 months with a duplex of his mesenteric stents ------  notified patient's wife of fu appt on 09-06-13 with dr. [name]

## 2013-02-14 LAB — POCT ACTIVATED CLOTTING TIME: Activated Clotting Time: 186 seconds

## 2013-03-16 ENCOUNTER — Encounter (HOSPITAL_COMMUNITY): Payer: Self-pay

## 2013-03-16 ENCOUNTER — Inpatient Hospital Stay (HOSPITAL_COMMUNITY)
Admission: EM | Admit: 2013-03-16 | Discharge: 2013-03-20 | DRG: 811 | Disposition: A | Discharge status: Home / Self Care | Payer: Medicare Other | Attending: Internal Medicine | Admitting: Internal Medicine

## 2013-03-16 DIAGNOSIS — I4891 Unspecified atrial fibrillation: Secondary | ICD-10-CM

## 2013-03-16 DIAGNOSIS — Z8546 Personal history of malignant neoplasm of prostate: Secondary | ICD-10-CM

## 2013-03-16 DIAGNOSIS — I5033 Acute on chronic diastolic (congestive) heart failure: Secondary | ICD-10-CM

## 2013-03-16 DIAGNOSIS — E785 Hyperlipidemia, unspecified: Secondary | ICD-10-CM | POA: Diagnosis present

## 2013-03-16 DIAGNOSIS — Z48812 Encounter for surgical aftercare following surgery on the circulatory system: Secondary | ICD-10-CM

## 2013-03-16 DIAGNOSIS — I2510 Atherosclerotic heart disease of native coronary artery without angina pectoris: Secondary | ICD-10-CM | POA: Diagnosis present

## 2013-03-16 DIAGNOSIS — Z86718 Personal history of other venous thrombosis and embolism: Secondary | ICD-10-CM

## 2013-03-16 DIAGNOSIS — R710 Precipitous drop in hematocrit: Secondary | ICD-10-CM

## 2013-03-16 DIAGNOSIS — N184 Chronic kidney disease, stage 4 (severe): Secondary | ICD-10-CM

## 2013-03-16 DIAGNOSIS — I472 Ventricular tachycardia, unspecified: Secondary | ICD-10-CM | POA: Diagnosis present

## 2013-03-16 DIAGNOSIS — I739 Peripheral vascular disease, unspecified: Secondary | ICD-10-CM

## 2013-03-16 DIAGNOSIS — K551 Chronic vascular disorders of intestine: Secondary | ICD-10-CM

## 2013-03-16 DIAGNOSIS — I200 Unstable angina: Secondary | ICD-10-CM

## 2013-03-16 DIAGNOSIS — Z87891 Personal history of nicotine dependence: Secondary | ICD-10-CM

## 2013-03-16 DIAGNOSIS — I4729 Other ventricular tachycardia: Secondary | ICD-10-CM

## 2013-03-16 DIAGNOSIS — D638 Anemia in other chronic diseases classified elsewhere: Principal | ICD-10-CM

## 2013-03-16 DIAGNOSIS — I129 Hypertensive chronic kidney disease with stage 1 through stage 4 chronic kidney disease, or unspecified chronic kidney disease: Secondary | ICD-10-CM | POA: Diagnosis present

## 2013-03-16 DIAGNOSIS — E119 Type 2 diabetes mellitus without complications: Secondary | ICD-10-CM

## 2013-03-16 DIAGNOSIS — D696 Thrombocytopenia, unspecified: Secondary | ICD-10-CM

## 2013-03-16 DIAGNOSIS — K219 Gastro-esophageal reflux disease without esophagitis: Secondary | ICD-10-CM | POA: Diagnosis present

## 2013-03-16 DIAGNOSIS — I480 Paroxysmal atrial fibrillation: Secondary | ICD-10-CM

## 2013-03-16 DIAGNOSIS — I509 Heart failure, unspecified: Secondary | ICD-10-CM | POA: Diagnosis present

## 2013-03-16 DIAGNOSIS — I5032 Chronic diastolic (congestive) heart failure: Secondary | ICD-10-CM | POA: Diagnosis present

## 2013-03-16 DIAGNOSIS — Z951 Presence of aortocoronary bypass graft: Secondary | ICD-10-CM

## 2013-03-16 DIAGNOSIS — E876 Hypokalemia: Secondary | ICD-10-CM

## 2013-03-16 DIAGNOSIS — Z950 Presence of cardiac pacemaker: Secondary | ICD-10-CM

## 2013-03-16 DIAGNOSIS — N179 Acute kidney failure, unspecified: Secondary | ICD-10-CM | POA: Diagnosis present

## 2013-03-16 DIAGNOSIS — I10 Essential (primary) hypertension: Secondary | ICD-10-CM

## 2013-03-16 LAB — BASIC METABOLIC PANEL
CO2: 26 mEq/L (ref 19–32)
Glucose, Bld: 143 mg/dL — ABNORMAL HIGH (ref 70–99)
Potassium: 3.2 mEq/L — ABNORMAL LOW (ref 3.5–5.1)
Sodium: 142 mEq/L (ref 135–145)

## 2013-03-16 LAB — CBC
Hemoglobin: 6.7 g/dL — CL (ref 13.0–17.0)
RBC: 1.88 MIL/uL — ABNORMAL LOW (ref 4.22–5.81)

## 2013-03-16 LAB — OCCULT BLOOD, POC DEVICE: Fecal Occult Bld: NEGATIVE

## 2013-03-16 LAB — POCT I-STAT TROPONIN I: Troponin i, poc: 0.02 ng/mL (ref 0.00–0.08)

## 2013-03-16 MED ORDER — SODIUM CHLORIDE 0.9 % IJ SOLN
3.0000 mL | Freq: Two times a day (BID) | INTRAMUSCULAR | Status: DC
  Administered 2013-03-18: 10:00:00 3 mL via INTRAVENOUS

## 2013-03-16 MED ORDER — ALUM & MAG HYDROXIDE-SIMETH 200-200-20 MG/5ML PO SUSP: 30.0000 mL | Freq: Four times a day (QID) | ORAL | Status: DC | PRN

## 2013-03-16 MED ORDER — VITAMIN D3 25 MCG (1000 UNIT) PO TABS
1000.0000 [IU] | ORAL_TABLET | Freq: Every day | ORAL | Status: DC
  Administered 2013-03-17 – 2013-03-20 (×4): 1000 [IU] via ORAL
  Filled 2013-03-16 (×4): qty 1

## 2013-03-16 MED ORDER — ACETAMINOPHEN 650 MG RE SUPP: 650.0000 mg | Freq: Four times a day (QID) | RECTAL | Status: DC | PRN

## 2013-03-16 MED ORDER — TAMSULOSIN HCL 0.4 MG PO CAPS
0.4000 mg | ORAL_CAPSULE | Freq: Every day | ORAL | Status: DC
  Administered 2013-03-17 – 2013-03-20 (×5): 0.4 mg via ORAL
  Filled 2013-03-16 (×5): qty 1

## 2013-03-16 MED ORDER — CALCIUM 600-200 MG-UNIT PO TABS: 1.0000 | ORAL_TABLET | Freq: Three times a day (TID) | ORAL | Status: DC

## 2013-03-16 MED ORDER — ONDANSETRON HCL 4 MG/2ML IJ SOLN: 4.0000 mg | Freq: Four times a day (QID) | INTRAMUSCULAR | Status: DC | PRN

## 2013-03-16 MED ORDER — SODIUM CHLORIDE 0.9 % IJ SOLN
3.0000 mL | INTRAMUSCULAR | Status: DC | PRN
  Administered 2013-03-18: 10:00:00 3 mL via INTRAVENOUS

## 2013-03-16 MED ORDER — HYDROMORPHONE HCL PF 1 MG/ML IJ SOLN
0.5000 mg | INTRAMUSCULAR | Status: DC | PRN
Start: 2013-03-16 — End: 2013-03-18

## 2013-03-16 MED ORDER — METOPROLOL TARTRATE 12.5 MG HALF TABLET
12.5000 mg | ORAL_TABLET | Freq: Two times a day (BID) | ORAL | Status: DC
  Administered 2013-03-17 – 2013-03-20 (×7): 12.5 mg via ORAL
  Filled 2013-03-16 (×9): qty 1

## 2013-03-16 MED ORDER — ONDANSETRON HCL 4 MG PO TABS: 4.0000 mg | ORAL_TABLET | Freq: Four times a day (QID) | ORAL | Status: DC | PRN

## 2013-03-16 MED ORDER — CLONIDINE HCL 0.2 MG PO TABS
0.2000 mg | ORAL_TABLET | Freq: Two times a day (BID) | ORAL | Status: DC
  Administered 2013-03-16 – 2013-03-20 (×8): 0.2 mg via ORAL
  Filled 2013-03-16 (×9): qty 1

## 2013-03-16 MED ORDER — ATORVASTATIN CALCIUM 80 MG PO TABS
80.0000 mg | ORAL_TABLET | Freq: Every day | ORAL | Status: DC
  Administered 2013-03-17 – 2013-03-19 (×3): 80 mg via ORAL
  Filled 2013-03-16 (×6): qty 1

## 2013-03-16 MED ORDER — SODIUM CHLORIDE 0.9 % IJ SOLN
3.0000 mL | Freq: Two times a day (BID) | INTRAMUSCULAR | Status: DC
  Administered 2013-03-16 – 2013-03-20 (×7): 3 mL via INTRAVENOUS

## 2013-03-16 MED ORDER — AMLODIPINE BESYLATE 5 MG PO TABS
5.0000 mg | ORAL_TABLET | Freq: Every day | ORAL | Status: DC
  Filled 2013-03-16: qty 1

## 2013-03-16 MED ORDER — FERROUS SULFATE 325 (65 FE) MG PO TABS
325.0000 mg | ORAL_TABLET | Freq: Two times a day (BID) | ORAL | Status: DC
  Administered 2013-03-17 – 2013-03-20 (×7): 325 mg via ORAL
  Filled 2013-03-16 (×8): qty 1

## 2013-03-16 MED ORDER — OXYCODONE HCL 5 MG PO TABS: 5.0000 mg | ORAL_TABLET | ORAL | Status: DC | PRN

## 2013-03-16 MED ORDER — PANTOPRAZOLE SODIUM 40 MG IV SOLR
40.0000 mg | Freq: Two times a day (BID) | INTRAVENOUS | Status: DC
  Administered 2013-03-17 (×2): 40 mg via INTRAVENOUS
  Filled 2013-03-16 (×3): qty 40

## 2013-03-16 MED ORDER — ZOLPIDEM TARTRATE 5 MG PO TABS: 5.0000 mg | ORAL_TABLET | Freq: Every evening | ORAL | Status: DC | PRN

## 2013-03-16 MED ORDER — AMIODARONE HCL 100 MG PO TABS
100.0000 mg | ORAL_TABLET | Freq: Every day | ORAL | Status: DC
  Administered 2013-03-17 – 2013-03-20 (×4): 100 mg via ORAL
  Filled 2013-03-16 (×4): qty 1

## 2013-03-16 MED ORDER — ACETAMINOPHEN 325 MG PO TABS
650.0000 mg | ORAL_TABLET | Freq: Four times a day (QID) | ORAL | Status: DC | PRN
  Administered 2013-03-18: 650 mg via ORAL
  Filled 2013-03-16: qty 2

## 2013-03-16 MED ORDER — POTASSIUM CHLORIDE CRYS ER 20 MEQ PO TBCR
20.0000 meq | EXTENDED_RELEASE_TABLET | Freq: Two times a day (BID) | ORAL | Status: DC
  Administered 2013-03-16 – 2013-03-17 (×2): 20 meq via ORAL
  Administered 2013-03-17: 40 meq via ORAL
  Filled 2013-03-16 (×2): qty 2
  Filled 2013-03-16: qty 1
  Filled 2013-03-16 (×2): qty 2

## 2013-03-16 MED ORDER — SODIUM CHLORIDE 0.9 % IV SOLN: 250.0000 mL | INTRAVENOUS | Status: DC | PRN

## 2013-03-16 NOTE — ED Notes (Signed)
Blood collected by Michel Santee but clicked off under RN-Lori Berdick's name.

## 2013-03-16 NOTE — ED Provider Notes (Signed)
CSN: [id number]     Arrival date & time 03/16/13  1809 History   First MD Initiated Contact with Patient 03/16/13 1958     Chief Complaint  Patient presents with  . Abnormal Lab   (Consider location/radiation/quality/duration/timing/severity/associated sxs/prior Treatment) The history is provided by the patient. No language interpreter was used.  [name]. is a 77 year old male with past medical history diabetes, hypertension, coronary artery disease, DVT, A. fib currently on Plavix, cabbage performance in 1994 and 2013 presenting to the emergency department with weakness, fatigue has been ongoing for the past 2 months with association of leg swelling for the past 3 weeks as well as chest discomfort for the past 3 weeks with shortness of breath. Patient reported that he was seen by his primary care provider today, Dr. Darylene Price, reported that his hemoglobin was 7.1 and was immediately referred to come to the emergency department immediately. Patient reported that his chest pain is described as a soreness that is intermittent it has been ongoing for the past couple weeks localized the right side of the chest that is intermittent lasting a short period of time that occurs with rest, activity, no current identified. Patient reports that he's been having intermittent headaches on and off for the past month. Patient reports that she's been having mild melena x2 years, reports he thinks it is associated with iron. Patient reported that he's been having diarrhea for the past 2 months on and off as well. Patient reported a single episode of drop in hemoglobin approximately last year, November 2013 bradycardia had to be admitted. Denied difficulty breathing, nausea, vomiting, abdominal pain, hematochezia, constipation, blurred vision, numbness, tingling, difficulty swallowing. PCP Dr. Bonnetta Barry Cardiologist Dr. Sherilyn Cooter Smith-patient reported he has appointment with this provider on Monday,  03/19/2013  Past Medical History  Diagnosis Date  . Diabetes mellitus   . CAD (coronary artery disease)     s/b CABG '94  . Hypertension   . Anemia     Macrocytic  . Peripheral vascular disease   . Hyperlipidemia   . Cancer     prostate  . Ulcer   . GERD (gastroesophageal reflux disease)   . Liddle's syndrome   . Hemorrhoids   . Intestinal ischemia   . DVT (deep venous thrombosis)   . Atrial fibrillation     Paroxysmal. S/p PPM  . CHF (congestive heart failure)    Past Surgical History  Procedure Laterality Date  . Hiatal hernia repair      and ulcer repair  . Appendectomy    . Pacemaker insertion    . Coronary artery bypass graft  01/22/1993  . Cholecystectomy  Oct 2009    Gall Bladder  . Coronary artery bypass graft  01/03/2012    Procedure: REDO CORONARY ARTERY BYPASS GRAFTING (CABG);  Surgeon: Alleen Borne, MD;  Location: [facility] OR;  Service: Open Heart Surgery;  Laterality: N/A;  Redo CABG x  using bilateral internal mammary arteries;  left leg greater saphenous vein harvested endoscopically  . Celiac artery angioplasty  05-16-12    and stenting   Family History  Problem Relation Age of Onset  . Diabetes Mother   . Heart disease Mother     Heart Disease before age 66  . Hyperlipidemia Mother   . Hypertension Mother   . Cancer Father   . Cancer Sister   . Diabetes Sister   . Heart disease Daughter     Heart Disease before age 66  .  Hypertension Daughter   . Heart attack Daughter    History  Substance Use Topics  . Smoking status: Former Smoker    Types: Cigarettes    Quit date: 07/12/1977  . Smokeless tobacco: Never Used  . Alcohol Use: No    Review of Systems  Constitutional: Positive for fatigue. Negative for fever and chills.  HENT: Negative for sore throat, neck pain and neck stiffness.   Eyes: Negative for visual disturbance.  Respiratory: Negative for cough.   Cardiovascular: Positive for chest pain.  Gastrointestinal: Negative for nausea,  vomiting, abdominal pain, blood in stool and anal bleeding.  Genitourinary: Negative for dysuria.  Neurological: Positive for weakness. Negative for dizziness and numbness.  All other systems reviewed and are negative.    Allergies  Ace inhibitors and Ibuprofen  Home Medications   No current outpatient prescriptions on file. BP 170/81  Pulse 59  Temp(Src) 98.6 F (37 C) (Oral)  Resp 18  Ht 5\' 10"  (1.778 m)  Wt 164 lb 10.9 oz (74.7 kg)  BMI 23.63 kg/m2  SpO2 99% Physical Exam  Nursing note and vitals reviewed. Constitutional: He is oriented to person, place, and time. He appears well-developed and well-nourished. No distress.  HENT:  Head: Normocephalic and atraumatic.  Eyes: Conjunctivae and EOM are normal. Pupils are equal, round, and reactive to light. Right eye exhibits no discharge. Left eye exhibits no discharge.  Neck: Normal range of motion. Neck supple.  Negative neck stiffness Negative nuchal rigidity Negative lymphadenopathy  Cardiovascular: Normal rate, regular rhythm and normal heart sounds.  Exam reveals no friction rub.   No murmur heard. Pulses:      Radial pulses are 2+ on the right side, and 2+ on the left side.       Dorsalis pedis pulses are 2+ on the right side, and 2+ on the left side.  Swelling noted to bilateral legs with pitting edema noted, 2+, with to just below the knees bilaterally Cap refill < 3 seconds.  Negative erythema noted to the legs bilaterally  Pulmonary/Chest: Effort normal and breath sounds normal. No respiratory distress. He has no wheezes. He has no rales. He exhibits tenderness.  Pain reproducible upon palpation to the right side of the chest and center   Abdominal: Soft. Normal appearance and bowel sounds are normal. He exhibits no distension. There is tenderness in the right lower quadrant, suprapubic area and left lower quadrant. There is no rebound, no guarding, no tenderness at McBurney's point and negative Murphy's sign.     Discomfort upon palpation to the right lower quadrant, left pleural quadrant, suprapubic region upon palpation  Lymphadenopathy:    He has no cervical adenopathy.  Neurological: He is alert and oriented to person, place, and time. No cranial nerve deficit. He exhibits normal muscle tone. Coordination normal.  Cranial nerves III through XII grossly intact Strength 3+/5+ with resistance to upper and lower extremities bilaterally   Skin: Skin is warm and dry. No rash noted. He is not diaphoretic. No erythema.  Psychiatric: He has a normal mood and affect. His behavior is normal. Thought content normal.    ED Course  Procedures (including critical care time)  9:52 PM spoke with Dr. Mort Sawyers from triad hospitalist, patient to be admitted to internal medicine, telemetry bed as inpatient.   Date: 03/16/2013  Rate: 70  Rhythm: normal sinus rhythm a short PR with premature atrial complexes  QRS Axis: normal  Intervals: normal  ST/T Wave abnormalities: nonspecific ST/T changes  Conduction Disutrbances:right bundle branch block incomplete  Narrative Interpretation:   Old EKG Reviewed: changes noted  Labs Review Labs Reviewed  CBC - Abnormal; Notable for the following:    RBC 1.88 (*)    Hemoglobin 6.7 (*)    HCT 19.3 (*)    MCV 102.7 (*)    MCH 35.6 (*)    Platelets 128 (*)    All other components within normal limits  BASIC METABOLIC PANEL - Abnormal; Notable for the following:    Potassium 3.2 (*)    Glucose, Bld 143 (*)    BUN 27 (*)    Creatinine, Ser 2.39 (*)    GFR calc non Af Amer 25 (*)    GFR calc Af Amer 29 (*)    All other components within normal limits  PRO B NATRIURETIC PEPTIDE - Abnormal; Notable for the following:    Pro B Natriuretic peptide (BNP) 5934.0 (*)    All other components within normal limits  BASIC METABOLIC PANEL - Abnormal; Notable for the following:    BUN 26 (*)    Creatinine, Ser 2.20 (*)    GFR calc non Af Amer 28 (*)    GFR calc Af  Amer 32 (*)    All other components within normal limits  CBC - Abnormal; Notable for the following:    RBC 2.64 (*)    Hemoglobin 9.0 (*)    HCT 25.5 (*)    MCH 34.1 (*)    RDW 17.7 (*)    Platelets 104 (*)    All other components within normal limits  IRON AND TIBC - Abnormal; Notable for the following:    TIBC 144 (*)    UIBC 67 (*)    All other components within normal limits  FERRITIN - Abnormal; Notable for the following:    Ferritin 1309 (*)    All other components within normal limits  HEMOGLOBIN A1C - Abnormal; Notable for the following:    Hemoglobin A1C 5.7 (*)    Mean Plasma Glucose 117 (*)    All other components within normal limits  RETICULOCYTES - Abnormal; Notable for the following:    RBC. 2.64 (*)    All other components within normal limits  GLUCOSE, CAPILLARY - Abnormal; Notable for the following:    Glucose-Capillary 114 (*)    All other components within normal limits  GLUCOSE, CAPILLARY - Abnormal; Notable for the following:    Glucose-Capillary 134 (*)    All other components within normal limits  GLUCOSE, CAPILLARY - Abnormal; Notable for the following:    Glucose-Capillary 135 (*)    All other components within normal limits  PROTIME-INR  APTT  GLUCOSE, CAPILLARY  TROPONIN I  TROPONIN I  TROPONIN I  VITAMIN B12  FOLATE  GLUCOSE, CAPILLARY  BASIC METABOLIC PANEL  CBC  POCT I-STAT TROPONIN I  OCCULT BLOOD, POC DEVICE  TYPE AND SCREEN  PREPARE RBC (CROSSMATCH)   Imaging Review No results found.  MDM   1. Decreased hemoglobin   2. Diabetes mellitus   3. Hypertension   4. Atrial fibrillation   5. Acute on chronic diastolic CHF (congestive heart failure)   6. HTN (hypertension), malignant     Patient presenting to emergency department with abnormal labs. Patient was recently seen by primary care provider we hemoglobin level was approximately 7.1-patient was immediately brought over to the emergency department. Patient reported a  similar episode in November 2013 when he was admitted to the hospital. Alert and  oriented. Lungs good auscultation bilaterally. Heart rate and rhythm normal. Negative signs of paleness or change to color. Negative pale conjunctiva. GCS is 15. Sensation intact. Pulses palpable. Leg swelling identified to lower extremities bilaterally, pitting edema 2+ all the way up from dorsal aspect of the feet bilaterally to just below the knee bilaterally. EKG noted changes with incomplete right bundle branch block with sinus rhythm with short PR with premature atrial complexes and nonspecific ST and T wave abnormalities-changes noted when compared to 01/04/2012 EKG. First set of troponins negative. Basic metabolic panel identified hypokalemia of 3.2. Elevated BUN of 27 and creatinine of 2.39. Tetanus can perform being O+. Negative fecal occult. Hemoglobin noted to be 6.7. BNP 5934. Patient placed on cardiac monitoring and pulse ox. Patient seen and assessed by Dr. Dub Mikes before patient to be admitted to the hospital. Patient stable, afebrile. Patient placed on 2 units of packed red blood cells. Discussed case with Dr. Lovell Sheehan, triad hospitalist, patient to be admitted to the hospital secondary to drop in hemoglobin with onset of weakness and shortness of breath as well as fatigue. Patient also hypokalemic as well with elevated BNP suspicion to be high for her congestive heart failure. Patient admitted to telemetry as inpatient.    Raymon Mutton, PA-C 03/18/13 0139

## 2013-03-16 NOTE — ED Notes (Signed)
2nd IV attempted x1 without success.

## 2013-03-16 NOTE — ED Provider Notes (Signed)
Complains of generalized weakness and fatigue for  3 weeks accompanied by bilateral leg edema for 3 weeks. He denies shortness of breath. Denies other symptoms. Patient noted to be anemic, requiring transfusion  Doug Sou, MD 03/16/13 2204

## 2013-03-16 NOTE — ED Provider Notes (Signed)
CSN: [id number]     Arrival date & time 03/16/13  1809 History   None    Chief Complaint  Patient presents with  . Abnormal Lab   (Consider location/radiation/quality/duration/timing/severity/associated sxs/prior Treatment) HPI  Past Medical History  Diagnosis Date  . Diabetes mellitus   . CAD (coronary artery disease)     s/b CABG '94  . Hypertension   . Anemia     Macrocytic  . Peripheral vascular disease   . Hyperlipidemia   . Cancer     prostate  . Ulcer   . GERD (gastroesophageal reflux disease)   . Liddle's syndrome   . Hemorrhoids   . Intestinal ischemia   . DVT (deep venous thrombosis)   . Atrial fibrillation     Paroxysmal. S/p PPM  . CHF (congestive heart failure)    Past Surgical History  Procedure Laterality Date  . Hiatal hernia repair      and ulcer repair  . Appendectomy    . Pacemaker insertion    . Coronary artery bypass graft  01/22/1993  . Cholecystectomy  Oct 2009    Gall Bladder  . Coronary artery bypass graft  01/03/2012    Procedure: REDO CORONARY ARTERY BYPASS GRAFTING (CABG);  Surgeon: Alleen Borne, MD;  Location: [facility] OR;  Service: Open Heart Surgery;  Laterality: N/A;  Redo CABG x  using bilateral internal mammary arteries;  left leg greater saphenous vein harvested endoscopically  . Celiac artery angioplasty  05-16-12    and stenting   Family History  Problem Relation Age of Onset  . Diabetes Mother   . Heart disease Mother     Heart Disease before age 72  . Hyperlipidemia Mother   . Hypertension Mother   . Cancer Father   . Cancer Sister   . Diabetes Sister   . Heart disease Daughter     Heart Disease before age 93  . Hypertension Daughter   . Heart attack Daughter    History  Substance Use Topics  . Smoking status: Former Smoker    Types: Cigarettes    Quit date: 07/12/1977  . Smokeless tobacco: Never Used  . Alcohol Use: No    Review of Systems  Allergies  Ace inhibitors and Ibuprofen  Home Medications   Current  Outpatient Rx  Name  Route  Sig  Dispense  Refill  . acetaminophen (ARTHRITIS PAIN RELIEF) 650 MG CR tablet   Oral   Take 1,300 mg by mouth 2 (two) times daily.          Marland Kitchen amiodarone (PACERONE) 200 MG tablet   Oral   Take 100 mg by mouth daily.         Marland Kitchen amLODipine (NORVASC) 5 MG tablet   Oral   Take 5 mg by mouth daily.          Marland Kitchen aspirin 325 MG tablet   Oral   Take 325 mg by mouth daily.         Marland Kitchen atorvastatin (LIPITOR) 80 MG tablet   Oral   Take 80 mg by mouth daily.         Marland Kitchen CALCIUM PO   Oral   Take 2,000 mg by mouth daily.          . cholecalciferol (VITAMIN D) 1000 UNITS tablet   Oral   Take 1,000 Units by mouth daily.          . cloNIDine (CATAPRES) 0.2 MG tablet   Oral  Take 0.2 mg by mouth 2 (two) times daily.          . clopidogrel (PLAVIX) 75 MG tablet   Oral   Take 75 mg by mouth daily.         . ferrous sulfate 325 (65 FE) MG tablet   Oral   Take 325 mg by mouth 2 (two) times daily.         . furosemide (LASIX) 80 MG tablet   Oral   Take 80 mg by mouth daily.          Marland Kitchen Leuprolide Acetate (LUPRON DEPOT IM)   Intramuscular   Inject into the muscle See admin instructions. Every 4 months as needed         . losartan (COZAAR) 100 MG tablet   Oral   Take 100 mg by mouth daily.         . metFORMIN (GLUCOPHAGE) 500 MG tablet   Oral   Take 1,000 mg by mouth 2 (two) times daily with a meal.         . EXPIRED: metoprolol tartrate (LOPRESSOR) 25 MG tablet   Oral   Take 25 mg by mouth 2 (two) times daily. Take 1/2 tab bid         . metoprolol tartrate (LOPRESSOR) 25 MG tablet   Oral   Take 25 mg by mouth 2 (two) times daily.         . pantoprazole (PROTONIX) 40 MG tablet   Oral   Take 40 mg by mouth daily.         . potassium chloride (KLOR-CON) 20 MEQ packet   Oral   Take 20 mEq by mouth 2 (two) times daily. 2 tablets q am, 1 tablet q hs         . silodosin (RAPAFLO) 8 MG CAPS capsule   Oral   Take 8  mg by mouth daily.           BP 145/58  Pulse 69  Temp(Src) 97.8 F (36.6 C) (Oral)  Resp 16  SpO2 98% Physical Exam  ED Course  Procedures (including critical care time) Labs Review Labs Reviewed  CBC  BASIC METABOLIC PANEL  PRO B NATRIURETIC PEPTIDE  POCT I-STAT TROPONIN I   Imaging Review No results found.  MDM  No diagnosis found. Duplicate note. Please delete    Doug Sou, MD 03/17/13 0005

## 2013-03-16 NOTE — ED Notes (Signed)
Pt. Was sent by his Dr. Catha Gosselin , pt.s hemoglobin is 7.1   Pt. Is experiencing fatique , weakness,  Sob and chest pain,   Pt. Also having swelling in both ankles.

## 2013-03-16 NOTE — Progress Notes (Signed)
[name] [id number] Admitted to %W05: 03/16/2013  Attending Provider: Ron Parker, MD    [name] Fetters Sr. is a 77 y.o. male patient admitted from ED awake, alert  & orientated  X 4,  Full Code, VSS - Blood pressure 178/76, pulse 60, temperature 97.7 F (36.5 C), temperature source Oral, resp. rate 18, height 5\' 10"  (1.778 m), weight 74.7 kg (164 lb 10.9 oz), SpO2 100.00%., O2 RA, no c/o shortness of breath, no c/o chest pain, no distress noted. Tele #[id number] placed and pt is currently running: atrial paced.   IV site WDL:  antecubital right, condition patent and no redness with a transparent dsg that's clean dry and intact.  Allergies:   Allergies  Allergen Reactions  . Ace Inhibitors Cough  . Ibuprofen Other (See Comments)    GI Issues     Past Medical History  Diagnosis Date  . Diabetes mellitus   . CAD (coronary artery disease)     s/b CABG '94  . Hypertension   . Anemia     Macrocytic  . Peripheral vascular disease   . Hyperlipidemia   . Cancer     prostate  . Ulcer   . GERD (gastroesophageal reflux disease)   . Liddle's syndrome   . Hemorrhoids   . Intestinal ischemia   . DVT (deep venous thrombosis)   . Atrial fibrillation     Paroxysmal. S/p PPM  . CHF (congestive heart failure)     History:  obtained from the patient.  Pt orientation to unit, room and routine. Information packet given to patient/family and safety video watched.  Admission INP armband ID verified with patient/family, and in place. SR up x 2, fall risk assessment complete with Patient and family verbalizing understanding of risks associated with falls. Pt verbalizes an understanding of how to use the call bell and to call for help before getting out of bed.  Skin, clean-dry- intact without evidence of bruising, or skin tears.   No evidence of skin break down noted on exam.    Will cont to monitor and assist as needed.  Julien Nordmann [facility], RN 03/16/2013 11:46 PM

## 2013-03-17 DIAGNOSIS — I10 Essential (primary) hypertension: Secondary | ICD-10-CM | POA: Insufficient documentation

## 2013-03-17 DIAGNOSIS — E119 Type 2 diabetes mellitus without complications: Secondary | ICD-10-CM | POA: Diagnosis present

## 2013-03-17 DIAGNOSIS — D696 Thrombocytopenia, unspecified: Secondary | ICD-10-CM | POA: Diagnosis present

## 2013-03-17 DIAGNOSIS — R710 Precipitous drop in hematocrit: Secondary | ICD-10-CM | POA: Diagnosis present

## 2013-03-17 DIAGNOSIS — I509 Heart failure, unspecified: Secondary | ICD-10-CM

## 2013-03-17 DIAGNOSIS — D649 Anemia, unspecified: Secondary | ICD-10-CM

## 2013-03-17 DIAGNOSIS — I5033 Acute on chronic diastolic (congestive) heart failure: Secondary | ICD-10-CM

## 2013-03-17 DIAGNOSIS — I4891 Unspecified atrial fibrillation: Secondary | ICD-10-CM

## 2013-03-17 DIAGNOSIS — I5032 Chronic diastolic (congestive) heart failure: Secondary | ICD-10-CM | POA: Diagnosis present

## 2013-03-17 LAB — BASIC METABOLIC PANEL
CO2: 30 mEq/L (ref 19–32)
Chloride: 104 mEq/L (ref 96–112)
Creatinine, Ser: 2.2 mg/dL — ABNORMAL HIGH (ref 0.50–1.35)
Potassium: 3.6 mEq/L (ref 3.5–5.1)

## 2013-03-17 LAB — GLUCOSE, CAPILLARY
Glucose-Capillary: 135 mg/dL — ABNORMAL HIGH (ref 70–99)
Glucose-Capillary: 99 mg/dL (ref 70–99)

## 2013-03-17 LAB — CBC
HCT: 25.5 % — ABNORMAL LOW (ref 39.0–52.0)
Hemoglobin: 9 g/dL — ABNORMAL LOW (ref 13.0–17.0)
MCV: 96.6 fL (ref 78.0–100.0)
RBC: 2.64 MIL/uL — ABNORMAL LOW (ref 4.22–5.81)
RDW: 17.7 % — ABNORMAL HIGH (ref 11.5–15.5)
WBC: 5.2 10*3/uL (ref 4.0–10.5)

## 2013-03-17 LAB — RETICULOCYTES
RBC.: 2.64 MIL/uL — ABNORMAL LOW (ref 4.22–5.81)
Retic Count, Absolute: 34.3 10*3/uL (ref 19.0–186.0)

## 2013-03-17 LAB — HEMOGLOBIN A1C
Hgb A1c MFr Bld: 5.7 % — ABNORMAL HIGH (ref <5.7)
Mean Plasma Glucose: 117 mg/dL — ABNORMAL HIGH (ref <117)

## 2013-03-17 LAB — TROPONIN I
Troponin I: <0.3 ng/mL (ref <0.30)
Troponin I: <0.3 ng/mL (ref <0.30)
Troponin I: <0.3 ng/mL (ref <0.30)

## 2013-03-17 LAB — VITAMIN B12: Vitamin B-12: 271 pg/mL (ref 211–911)

## 2013-03-17 LAB — IRON AND TIBC: UIBC: 67 ug/dL — ABNORMAL LOW (ref 125–400)

## 2013-03-17 LAB — PROTIME-INR: Prothrombin Time: 13.6 seconds (ref 11.6–15.2)

## 2013-03-17 MED ORDER — FUROSEMIDE 10 MG/ML IJ SOLN
80.0000 mg | Freq: Once | INTRAMUSCULAR | Status: AC
  Administered 2013-03-17: 05:00:00 80 mg via INTRAVENOUS
  Filled 2013-03-17: qty 8

## 2013-03-17 MED ORDER — CLOPIDOGREL BISULFATE 75 MG PO TABS
75.0000 mg | ORAL_TABLET | Freq: Every day | ORAL | Status: DC
  Administered 2013-03-17 – 2013-03-20 (×4): 75 mg via ORAL
  Filled 2013-03-17 (×5): qty 1

## 2013-03-17 MED ORDER — INSULIN ASPART 100 UNIT/ML ~~LOC~~ SOLN: 0.0000 [IU] | Freq: Every day | SUBCUTANEOUS | Status: DC

## 2013-03-17 MED ORDER — AMLODIPINE BESYLATE 5 MG PO TABS
5.0000 mg | ORAL_TABLET | Freq: Once | ORAL | Status: AC
  Administered 2013-03-17: 07:00:00 5 mg via ORAL
  Filled 2013-03-17: qty 1

## 2013-03-17 MED ORDER — METOLAZONE 5 MG PO TABS: 5.0000 mg | ORAL_TABLET | Freq: Two times a day (BID) | ORAL | Status: DC

## 2013-03-17 MED ORDER — METOLAZONE 5 MG PO TABS
5.0000 mg | ORAL_TABLET | Freq: Two times a day (BID) | ORAL | Status: DC
  Administered 2013-03-17 – 2013-03-18 (×3): 5 mg via ORAL
  Filled 2013-03-17 (×5): qty 1

## 2013-03-17 MED ORDER — FUROSEMIDE 80 MG PO TABS
80.0000 mg | ORAL_TABLET | Freq: Every day | ORAL | Status: DC
  Administered 2013-03-18 – 2013-03-20 (×3): 80 mg via ORAL
  Filled 2013-03-17 (×3): qty 1

## 2013-03-17 MED ORDER — ASPIRIN 325 MG PO TABS
325.0000 mg | ORAL_TABLET | Freq: Every day | ORAL | Status: DC
  Administered 2013-03-17 – 2013-03-20 (×4): 325 mg via ORAL
  Filled 2013-03-17 (×4): qty 1

## 2013-03-17 MED ORDER — AMLODIPINE BESYLATE 5 MG PO TABS
5.0000 mg | ORAL_TABLET | Freq: Every day | ORAL | Status: DC
  Administered 2013-03-18 – 2013-03-20 (×3): 5 mg via ORAL
  Filled 2013-03-17 (×3): qty 1

## 2013-03-17 MED ORDER — INSULIN ASPART 100 UNIT/ML ~~LOC~~ SOLN
0.0000 [IU] | Freq: Three times a day (TID) | SUBCUTANEOUS | Status: DC
  Administered 2013-03-17 – 2013-03-18 (×2): 1 [IU] via SUBCUTANEOUS
  Administered 2013-03-18 – 2013-03-19 (×2): 2 [IU] via SUBCUTANEOUS
  Administered 2013-03-19 – 2013-03-20 (×2): 1 [IU] via SUBCUTANEOUS

## 2013-03-17 MED ORDER — PANTOPRAZOLE SODIUM 40 MG PO TBEC
40.0000 mg | DELAYED_RELEASE_TABLET | Freq: Every day | ORAL | Status: DC
  Administered 2013-03-17 – 2013-03-19 (×3): 40 mg via ORAL
  Filled 2013-03-17 (×3): qty 1

## 2013-03-17 NOTE — Progress Notes (Addendum)
Patient admitted after midnight.  Chart reviewed. Patient interviewed and examined. No bleeding. hgb improved.  Crista Curb, M.D.

## 2013-03-17 NOTE — H&P (Addendum)
[facility] History and Physical  Gorge Almanza AVW:[id number] DOB: 1935/11/10 DOA: 03/16/2013  Referring physician:  EDP PCP: Mickie Hillier, MD  Specialists:   Chief Complaint:  Low hemoglobin  HPI: [name]. is a 77 y.o. male who was sent by his PCP to the ED after lab results revealed a hemoglobin of 7.1.  He has had weakness and fatigue and SOB and chest pain x 3 weeks He has also had increased edema of both lower legs for the past 3 weeks as well.   He was evaluated in the ED and was found to have a hemoglobin level of 6.7 and he denies having any melena or hematochezia or hematemesis.  He was found to have a Heme Negative FOBT in the ED and thrombocytopenia with platelets of 128.   He also was found to have an elevated BNP = 5934.0.  He was referred for medical admission.     Review of Systems: The patient denies anorexia, fever, chills, headaches, weight loss, vision loss, diplopia, dizziness, decreased hearing, rhinitis, hoarseness,  syncope, balance deficits, cough, hemoptysis, abdominal pain, nausea, vomiting, diarrhea, constipation, hematemesis, melena, hematochezia, severe indigestion/heartburn, dysuria, hematuria, incontinence, suspicious skin lesions, transient blindness, difficulty walking, depression, unusual weight change, abnormal bleeding, enlarged lymph nodes, angioedema, and breast masses.    Past Medical History  Diagnosis Date  . Diabetes mellitus   . CAD (coronary artery disease)     s/b CABG '94  . Hypertension   . Anemia     Macrocytic  . Peripheral vascular disease   . Hyperlipidemia   . Cancer     prostate  . Ulcer   . GERD (gastroesophageal reflux disease)   . Liddle's syndrome   . Hemorrhoids   . Intestinal ischemia   . DVT (deep venous thrombosis)   . Atrial fibrillation     Paroxysmal. S/p PPM  . CHF (congestive heart failure)     Past Surgical History  Procedure Laterality Date  . Hiatal hernia repair      and ulcer  repair  . Appendectomy    . Pacemaker insertion    . Coronary artery bypass graft  01/22/1993  . Cholecystectomy  Oct 2009    Gall Bladder  . Coronary artery bypass graft  01/03/2012    Procedure: REDO CORONARY ARTERY BYPASS GRAFTING (CABG);  Surgeon: Alleen Borne, MD;  Location: [facility] OR;  Service: Open Heart Surgery;  Laterality: N/A;  Redo CABG x  using bilateral internal mammary arteries;  left leg greater saphenous vein harvested endoscopically  . Celiac artery angioplasty  05-16-12    and stenting    Prior to Admission medications   Medication Sig Start Date End Date Taking? Authorizing Provider  acetaminophen (ARTHRITIS PAIN RELIEF) 650 MG CR tablet Take 1,300 mg by mouth 2 (two) times daily.    Yes Historical Provider, MD  amiodarone (PACERONE) 200 MG tablet Take 100 mg by mouth daily.   Yes Historical Provider, MD  amLODipine (NORVASC) 5 MG tablet Take 5 mg by mouth daily.  12/22/11  Yes Historical Provider, MD  aspirin 325 MG tablet Take 325 mg by mouth daily.   Yes Historical Provider, MD  atorvastatin (LIPITOR) 80 MG tablet Take 80 mg by mouth daily.   Yes Historical Provider, MD  CALCIUM PO Take 2,000 mg by mouth daily.    Yes Historical Provider, MD  cholecalciferol (VITAMIN D) 1000 UNITS tablet Take 1,000 Units by mouth daily.    Yes Historical  Provider, MD  cloNIDine (CATAPRES) 0.2 MG tablet Take 0.2 mg by mouth 2 (two) times daily.    Yes Historical Provider, MD  clopidogrel (PLAVIX) 75 MG tablet Take 75 mg by mouth daily. 05/18/12  Yes Regina J Roczniak, PA-C  ferrous sulfate 325 (65 FE) MG tablet Take 325 mg by mouth 2 (two) times daily.   Yes Historical Provider, MD  furosemide (LASIX) 80 MG tablet Take 80 mg by mouth daily.    Yes Historical Provider, MD  Leuprolide Acetate (LUPRON DEPOT IM) Inject into the muscle See admin instructions. Every 4 months as needed   Yes Historical Provider, MD  losartan (COZAAR) 100 MG tablet Take 100 mg by mouth daily.   Yes Historical  Provider, MD  metFORMIN (GLUCOPHAGE) 500 MG tablet Take 1,000 mg by mouth 2 (two) times daily with a meal. 05/21/12  Yes Regina J Roczniak, PA-C  metoprolol tartrate (LOPRESSOR) 25 MG tablet Take 12.5 mg by mouth 2 (two) times daily.    Yes Historical Provider, MD  pantoprazole (PROTONIX) 40 MG tablet Take 40 mg by mouth daily.   Yes Historical Provider, MD  potassium chloride SA (K-DUR,KLOR-CON) 20 MEQ tablet Take 20-40 mEq by mouth 2 (two) times daily. 20 meq in the evening and 40 meq in the a.m.   Yes Historical Provider, MD  silodosin (RAPAFLO) 8 MG CAPS capsule Take 8 mg by mouth daily.    Yes Historical Provider, MD    Allergies  Allergen Reactions  . Ace Inhibitors Cough  . Ibuprofen Other (See Comments)    GI Issues    Social History:  reports that he quit smoking about 35 years ago. His smoking use included Cigarettes. He smoked 0.00 packs per day. He has never used smokeless tobacco. He reports that he does not drink alcohol or use illicit drugs.     Family History  Problem Relation Age of Onset  . Diabetes Mother   . Heart disease Mother     Heart Disease before age 37  . Hyperlipidemia Mother   . Hypertension Mother   . Cancer Father   . Cancer Sister   . Diabetes Sister   . Heart disease Daughter     Heart Disease before age 69  . Hypertension Daughter   . Heart attack Daughter      Physical Exam:  GEN:  Pleasant Elderly Obese  76 y.o.  African American male  examined  and in no acute distress; cooperative with exam Filed Vitals:   03/17/13 0017 03/17/13 0037 03/17/13 0043 03/17/13 0100  BP: 173/73  176/80 176/80  Pulse:  60 60 60  Temp:   97.7 F (36.5 C) 97.6 F (36.4 C)  TempSrc:   Oral Oral  Resp:   16 18  Height:      Weight:      SpO2:  100%     Blood pressure 176/80, pulse 60, temperature 97.6 F (36.4 C), temperature source Oral, resp. rate 18, height 5\' 10"  (1.778 m), weight 74.7 kg (164 lb 10.9 oz), SpO2 100.00%. PSYCH: He is alert and  oriented x4; does not appear anxious does not appear depressed; affect is normal HEENT: Normocephalic and Atraumatic, Mucous membranes pink; PERRLA; EOM intact; Fundi:  Benign;  No scleral icterus, Nares: Patent, Oropharynx: Clear, Fair Dentition, Neck:  FROM, no cervical lymphadenopathy nor thyromegaly or carotid bruit; no JVD; Breasts:: Not examined CHEST WALL: No tenderness CHEST: Normal respiration, clear to auscultation bilaterally HEART: Regular rate and rhythm; no murmurs  rubs or gallops BACK: No kyphosis or scoliosis; no CVA tenderness ABDOMEN: Positive Bowel Sounds, Obese, soft non-tender; no masses, no organomegaly, no pannus; no intertriginous candida. Rectal Exam: Not done EXTREMITIES: No cyanosis, clubbing,   2+ BLE EDEMA to Pre-Tibial Areas; no ulcerations. Genitalia: not examined PULSES: 2+ and symmetric SKIN: Normal hydration no rash or ulceration CNS: Cranial nerves 2-12 grossly intact no focal neurologic deficit    Labs on Admission:  Basic Metabolic Panel:  Recent Labs Lab 03/16/13 1848  NA 142  K 3.2*  CL 105  CO2 26  GLUCOSE 143*  BUN 27*  CREATININE 2.39*  CALCIUM 8.8   Liver Function Tests: No results found for this basename: AST, ALT, ALKPHOS, BILITOT, PROT, ALBUMIN,  in the last 168 hours No results found for this basename: LIPASE, AMYLASE,  in the last 168 hours No results found for this basename: AMMONIA,  in the last 168 hours CBC:  Recent Labs Lab 03/16/13 1848  WBC 4.4  HGB 6.7*  HCT 19.3*  MCV 102.7*  PLT 128*   Cardiac Enzymes: No results found for this basename: CKTOTAL, CKMB, CKMBINDEX, TROPONINI,  in the last 168 hours  BNP (last 3 results)  Recent Labs  03/16/13 1848  PROBNP 5934.0*   CBG:  Recent Labs Lab 03/17/13 0035  GLUCAP 99    Radiological Exams on Admission: No results found.   EKG: Independently reviewed.    Assessment/Plan Principal Problem:   Decreased hemoglobin Active Problems:   PAF  (paroxysmal atrial fibrillation)   HTN (hypertension), malignant   Peripheral vascular disease, unspecified   Acute on chronic diastolic CHF (congestive heart failure)   Diabetes mellitus   Thrombocytopenia   Prostate Cancer History   CAD  1.   Anemia-  (Decreased Hemoglobin), Receiving Transfusion of 1 unit of PRBCS, and placed on IV Protonix,  FOBT was Heme Negative.   Check Anemia panel,  and monitor H/Hs and heme test stools Daily for occult GI Loss, however may be due to a production problem since another cell line is also low (i.e. Thrombocytopenia).    May need GI and or hematology evaluations.     2.   PAF- stable- on Meds. Amiodarone, and lopressor.     3.   HTN- Continue Lopressor, Clonidine and Amlodipine.      4.   Hyperlipidemia- Continue Atorvastatin.    5.   PVD and CAD- Continue Plavix and ASA.    6.   Acute on Chronic Diastolic CHF-  CHF Protocol wirth IV Lasix q 12 hrs x 4 doses. Monitor BUN/Cr.    7.  ARF with CRI-  Monitor BUN/CR.  Hold Losartan   8.  DM2-  On Metformin Rx but being held due to ARF,  Check HbA1C, and Add SSI coverage.     9.  CAD-  Check Troponins, but Chest pain is more than likely due to Anemia.         Code Status:   FULL CODE Family Communication:    Wife at Bedside Disposition Plan:    Inpatient  Time spent:  60 Minutes  Ron Parker [facility] Pager [phone]  If 7PM-7AM, please contact night-coverage [email or web address] Password [email or web address] 03/17/2013, 1:42 AM

## 2013-03-17 NOTE — Progress Notes (Signed)
Pt had a 20 beat run of V.Tach. Pt resting comfortably with no complaints. Burnadette Peter, NP notified, no new orders given.

## 2013-03-18 DIAGNOSIS — E876 Hypokalemia: Secondary | ICD-10-CM

## 2013-03-18 DIAGNOSIS — I472 Ventricular tachycardia: Secondary | ICD-10-CM | POA: Clinically undetermined

## 2013-03-18 DIAGNOSIS — N184 Chronic kidney disease, stage 4 (severe): Secondary | ICD-10-CM | POA: Diagnosis present

## 2013-03-18 LAB — CBC
Hemoglobin: 9.2 g/dL — ABNORMAL LOW (ref 13.0–17.0)
Platelets: 114 10*3/uL — ABNORMAL LOW (ref 150–400)
RBC: 2.72 MIL/uL — ABNORMAL LOW (ref 4.22–5.81)
WBC: 5.7 10*3/uL (ref 4.0–10.5)

## 2013-03-18 LAB — GLUCOSE, CAPILLARY
Glucose-Capillary: 96 mg/dL (ref 70–99)
Glucose-Capillary: 155 mg/dL — ABNORMAL HIGH (ref 70–99)

## 2013-03-18 LAB — TYPE AND SCREEN: Antibody Screen: NEGATIVE

## 2013-03-18 LAB — BASIC METABOLIC PANEL
GFR calc Af Amer: 28 mL/min — ABNORMAL LOW (ref >90)
GFR calc non Af Amer: 24 mL/min — ABNORMAL LOW (ref >90)
Glucose, Bld: 110 mg/dL — ABNORMAL HIGH (ref 70–99)
Potassium: 2.6 mEq/L — CL (ref 3.5–5.1)
Sodium: 140 mEq/L (ref 135–145)

## 2013-03-18 LAB — URINALYSIS, ROUTINE W REFLEX MICROSCOPIC
Glucose, UA: NEGATIVE mg/dL
Hgb urine dipstick: NEGATIVE
Ketones, ur: NEGATIVE mg/dL
Protein, ur: NEGATIVE mg/dL
pH: 5 (ref 5.0–8.0)

## 2013-03-18 LAB — MAGNESIUM: Magnesium: <0.2 mg/dL — CL (ref 1.5–2.5)

## 2013-03-18 LAB — PHOSPHORUS: Phosphorus: 2.4 mg/dL (ref 2.3–4.6)

## 2013-03-18 MED ORDER — MAGNESIUM SULFATE 40 MG/ML IJ SOLN: 4.0000 g | Freq: Once | INTRAMUSCULAR | Status: DC

## 2013-03-18 MED ORDER — HYDRALAZINE HCL 20 MG/ML IJ SOLN
10.0000 mg | Freq: Once | INTRAMUSCULAR | Status: AC
  Administered 2013-03-18: 10 mg via INTRAVENOUS
  Filled 2013-03-18: qty 1

## 2013-03-18 MED ORDER — MAGNESIUM OXIDE 400 (241.3 MG) MG PO TABS
400.0000 mg | ORAL_TABLET | Freq: Two times a day (BID) | ORAL | Status: DC
  Administered 2013-03-18 – 2013-03-20 (×5): 400 mg via ORAL
  Filled 2013-03-18 (×6): qty 1

## 2013-03-18 MED ORDER — MAGNESIUM SULFATE 40 MG/ML IJ SOLN
4.0000 g | Freq: Once | INTRAMUSCULAR | Status: AC
  Administered 2013-03-18: 4 g via INTRAVENOUS
  Filled 2013-03-18: qty 100

## 2013-03-18 MED ORDER — POTASSIUM CHLORIDE CRYS ER 20 MEQ PO TBCR
40.0000 meq | EXTENDED_RELEASE_TABLET | Freq: Three times a day (TID) | ORAL | Status: DC
  Administered 2013-03-18 – 2013-03-20 (×7): 40 meq via ORAL
  Filled 2013-03-18 (×7): qty 2

## 2013-03-18 NOTE — Progress Notes (Signed)
[facility] PROGRESS NOTE  [name] WUJ:[id number] DOB: March 11, 1936 DOA: 03/16/2013 PCP: Mickie Hillier, MD  Assessment/Plan:  Anemia: Workup consistent with chronic disease. No bleeding. Hemoglobin improved.    Hypokalemia: Replete by mouth.    Hypomagnesemia, profound: Patient will get IV and oral repletion. Continue telemetry monitoring.    HTN (hypertension)    Acute on chronic diastolic CHF (congestive heart failure): Patient has diuresis down to about his dry weight. Await repeat echocardiogram    CKD (chronic kidney disease), stage IV: Patient's creatinines have been in the 2.5 range over the past several months. Previously, about 1.5. Monitor and check urinalysis.    PAF (paroxysmal atrial fibrillation)    Peripheral vascular disease, unspecified    Diabetes mellitus hemoglobin A1c less than 6, and metformin has been stopped due to renal failure.    Thrombocytopenia    NSVT (nonsustained ventricular tachycardia) secondary to above.  Pacemaker in situ  Code Status: full Family Communication: none today Disposition Plan: home   Consultants:    Procedures:    Antibiotics:    HPI/Subjective: Felt well this morning, then now feels generalized malaise after getting up to the bathroom. No dyspnea. No bleeding. No shortness of breath or chest pain.  Objective: Filed Vitals:   03/18/13 1017  BP: 160/74  Pulse: 61  Temp:   Resp:     Intake/Output Summary (Last 24 hours) at 03/18/13 1139 Last data filed at 03/18/13 0524  Gross per 24 hour  Intake      0 ml  Output   2400 ml  Net  -2400 ml   Filed Weights   03/16/13 2316 03/17/13 0500 03/18/13 0519  Weight: 74.7 kg (164 lb 10.9 oz) 74.7 kg (164 lb 10.9 oz) 71.079 kg (156 lb 11.2 oz)    telemetry shows paced rhythm  Exam:   General:  Weak appearing  Cardiovascular: Regular rate rhythm without murmurs gallops  Respiratory: Clear to auscultation bilaterally without wheezes  rhonchi or rales  Abdomen: Soft nontender nondistended  Musculoskeletal: No edema  Data Reviewed: Basic Metabolic Panel:  Recent Labs Lab 03/16/13 1848 03/17/13 0534 03/18/13 0600  NA 142 142 140  K 3.2* 3.6 2.6*  CL 105 104 100  CO2 26 30 29   GLUCOSE 143* 85 110*  BUN 27* 26* 30*  CREATININE 2.39* 2.20* 2.45*  CALCIUM 8.8 8.9 8.9  MG  --   --  <0.2*   Liver Function Tests: No results found for this basename: AST, ALT, ALKPHOS, BILITOT, PROT, ALBUMIN,  in the last 168 hours No results found for this basename: LIPASE, AMYLASE,  in the last 168 hours No results found for this basename: AMMONIA,  in the last 168 hours CBC:  Recent Labs Lab 03/16/13 1848 03/17/13 0534 03/18/13 0600  WBC 4.4 5.2 5.7  HGB 6.7* 9.0* 9.2*  HCT 19.3* 25.5* 26.1*  MCV 102.7* 96.6 96.0  PLT 128* 104* 114*   Cardiac Enzymes:  Recent Labs Lab 03/17/13 0534 03/17/13 1101 03/17/13 1700  TROPONINI <0.30 <0.30 <0.30   BNP (last 3 results)  Recent Labs  03/16/13 1848  PROBNP 5934.0*   CBG:  Recent Labs Lab 03/17/13 0744 03/17/13 1224 03/17/13 1730 03/17/13 2137 03/18/13 0751  GLUCAP 82 114* 134* 135* 96    No results found for this or any previous visit (from the past 240 hour(s)).   Studies: No results found.  Scheduled Meds: . amiodarone  100 mg Oral Daily  . amLODipine  5 mg Oral  Daily  . aspirin  325 mg Oral Daily  . atorvastatin  80 mg Oral q1800  . cholecalciferol  1,000 Units Oral Daily  . cloNIDine  0.2 mg Oral BID  . clopidogrel  75 mg Oral Q breakfast  . ferrous sulfate  325 mg Oral BID  . furosemide  80 mg Oral Daily  . insulin aspart  0-5 Units Subcutaneous QHS  . insulin aspart  0-9 Units Subcutaneous TID WC  . magnesium oxide  400 mg Oral BID  . magnesium sulfate 1 - 4 g bolus IVPB  4 g Intravenous Once  . metolazone  5 mg Oral BID  . metoprolol tartrate  12.5 mg Oral BID  . pantoprazole  40 mg Oral QHS  . potassium chloride  40 mEq Oral TID  .  sodium chloride  3 mL Intravenous Q12H  . sodium chloride  3 mL Intravenous Q12H  . tamsulosin  0.4 mg Oral Daily   Continuous Infusions:   Time spent: 35 min  Mega Kinkade L  [facility] Pager [phone]. If 7PM-7AM, please contact night-coverage at [email or web address], password [facility] 03/18/2013, 11:39 AM  LOS: 2 days

## 2013-03-18 NOTE — Progress Notes (Signed)
CRITICAL VALUE ALERT  Critical value received:  K 2.6  Date of notification:  03/18/13  Time of notification:  0729  Critical value read back:yes  Nurse who received alert:  Jacquiline Doe, RN  MD notified (1st page):  Paris Lore  Time of first page:  [phone]  Responding MD:  Paris Lore  Time MD responded:  [phone]

## 2013-03-18 NOTE — Progress Notes (Signed)
Patient's BP 182/87 Pulse 60 M.Burnadette Peter, NP notified orders given for Hydralazine 10mg  IV x1. BP recheck at 0306 prior to administration 196/88, BP recheck 40 minutes after administration 167/78. Pt currently in bed resting and free from complaints, will continue to monitor and assist as needed. Julien Nordmann [facility]

## 2013-03-18 NOTE — ED Provider Notes (Signed)
Medical screening examination/treatment/procedure(s) were conducted as a shared visit with non-physician practitioner(s) and myself.  I personally evaluated the patient during the encounter  Doug Sou, MD 03/18/13 2342

## 2013-03-18 NOTE — Progress Notes (Signed)
CRITICAL VALUE ALERT  Critical value received:  Mg <0.2  Date of notification:  03/18/13  Time of notification:  0925  Critical value read back:yes  Nurse who received alert:  Jacquiline Doe, RN  MD notified (1st page):  Paris Lore  Time of first page:  0927  Responding MD:  Paris Lore  Time MD responded:  [phone]

## 2013-03-19 DIAGNOSIS — D638 Anemia in other chronic diseases classified elsewhere: Secondary | ICD-10-CM | POA: Diagnosis present

## 2013-03-19 LAB — T3, FREE: T3, Free: 1.8 pg/mL — ABNORMAL LOW (ref 2.3–4.2)

## 2013-03-19 LAB — GLUCOSE, CAPILLARY: Glucose-Capillary: 109 mg/dL — ABNORMAL HIGH (ref 70–99)

## 2013-03-19 LAB — BASIC METABOLIC PANEL
CO2: 31 mEq/L (ref 19–32)
Calcium: 8.8 mg/dL (ref 8.4–10.5)
Chloride: 98 mEq/L (ref 96–112)
Creatinine, Ser: 2.63 mg/dL — ABNORMAL HIGH (ref 0.50–1.35)
Glucose, Bld: 106 mg/dL — ABNORMAL HIGH (ref 70–99)
Sodium: 138 mEq/L (ref 135–145)

## 2013-03-19 LAB — T4, FREE: Free T4: 1.45 ng/dL (ref 0.80–1.80)

## 2013-03-19 LAB — MAGNESIUM: Magnesium: 1.2 mg/dL — ABNORMAL LOW (ref 1.5–2.5)

## 2013-03-19 MED ORDER — MAGNESIUM SULFATE 40 MG/ML IJ SOLN
4.0000 g | Freq: Once | INTRAMUSCULAR | Status: AC
  Administered 2013-03-19: 4 g via INTRAVENOUS
  Filled 2013-03-19: qty 100

## 2013-03-19 NOTE — Evaluation (Signed)
Physical Therapy One Time Evaluation Patient Details Name: JHAYDEN DEMURO Sr. MRN: [id number] DOB: [birth year] Today's Date: 03/19/2013 Time: 4098-1191 PT Time Calculation (min): 13 min  PT Assessment / Plan / Recommendation History of Present Illness  77 y.o. male who was sent by his PCP to the ED after lab results revealed a hemoglobin of 7.1.  He has had weakness and fatigue and SOB and chest pain x 3 weeks He has also had increased edema of both lower legs for the past 3 weeks as well.   He was evaluated in the ED and was found to have a hemoglobin level of 6.7 and he denies having any melena or hematochezia or hematemesis.  He was found to have a Heme Negative FOBT in the ED and thrombocytopenia with platelets of 128.   He also was found to have an elevated BNP = 5934.0.  He was referred for medical admission.      Clinical Impression  Patient evaluated by Physical Therapy with no further acute PT needs identified. All education has been completed and the patient has no further questions. No follow-up Physial Therapy or equipment needs as pt reports he is at his baseline and feels he will do well upon d/c home.  PT is signing off. Thank you for this referral.  Pt encouraged to ambulate with staff if remains in acute care.     PT Assessment  Patent does not need any further PT services    Follow Up Recommendations  No PT follow up    Does the patient have the potential to tolerate intense rehabilitation      Barriers to Discharge        Equipment Recommendations  None recommended by PT    Recommendations for Other Services     Frequency      Precautions / Restrictions Precautions Precautions: Fall   Pertinent Vitals/Pain n/a      Mobility  Bed Mobility Bed Mobility: Not assessed Details for Bed Mobility Assistance: pt up in recliner on arrival Transfers Transfers: Sit to Stand;Stand to Sit Sit to Stand: 5: Supervision;From chair/3-in-1 Stand to Sit: 5: Supervision;To  chair/3-in-1 Ambulation/Gait Ambulation/Gait Assistance: 4: Min guard;5: Supervision Ambulation Distance (Feet): 220 Feet Assistive device: Rolling walker Ambulation/Gait Assistance Details: no LOB or unsteady gait observed, pt able to perform head turns looking around environment without difficutly, pt reports he feels mobility is at baseline Gait Pattern: Step-through pattern;Decreased stride length Gait velocity: decreased    Exercises     PT Diagnosis:    PT Problem List:   PT Treatment Interventions:       PT Goals(Current goals can be found in the care plan section) Acute Rehab PT Goals PT Goal Formulation: No goals set, d/c therapy  Visit Information  Last PT Received On: 03/19/13 Assistance Needed: +1 History of Present Illness: 77 y.o. male who was sent by his PCP to the ED after lab results revealed a hemoglobin of 7.1.  He has had weakness and fatigue and SOB and chest pain x 3 weeks He has also had increased edema of both lower legs for the past 3 weeks as well.   He was evaluated in the ED and was found to have a hemoglobin level of 6.7 and he denies having any melena or hematochezia or hematemesis.  He was found to have a Heme Negative FOBT in the ED and thrombocytopenia with platelets of 128.   He also was found to have an elevated BNP = 5934.0.  He was referred for medical admission.           Prior Functioning  Home Living Family/patient expects to be discharged to:: Private residence Living Arrangements: Spouse/significant other Type of Home: House Home Layout: Two level;Laundry or work area in basement Alternate Teacher, music of Steps: flight Alternate Level Stairs-Rails: Right Home Equipment: Environmental consultant - 2 wheels;Cane - single point Additional Comments: pt reports he takes his time on stairs, one step at a time Prior Function Level of Independence: Independent with assistive device(s) Comments: usually uses SPC or RW Communication Communication: No  difficulties    Cognition  Cognition Arousal/Alertness: Awake/alert Behavior During Therapy: WFL for tasks assessed/performed Overall Cognitive Status: Within Functional Limits for tasks assessed    Extremity/Trunk Assessment Lower Extremity Assessment Lower Extremity Assessment: Overall WFL for tasks assessed Cervical / Trunk Assessment Cervical / Trunk Assessment: Kyphotic;Other exceptions Cervical / Trunk Exceptions: forward head posture   Balance    End of Session PT - End of Session Activity Tolerance: Patient tolerated treatment well Patient left: in chair;with call bell/phone within reach  GP     [facility] E 03/19/2013, 2:41 PM Zenovia Jarred, PT, DPT 03/19/2013 Pager: [phone]

## 2013-03-19 NOTE — Progress Notes (Signed)
[facility] PROGRESS NOTE  [name] ZOX:[id number] DOB: [birth year] DOA: 03/16/2013 PCP: Mickie Hillier, MD  Assessment/Plan:  Anemia: Workup consistent with chronic disease. No bleeding. Hemoglobin improved.    Hypokalemia improving: Replete by mouth.    Hypomagnesemia, profound: improved, but still low.  Patient will get IV and oral repletion. Continue telemetry monitoring.    HTN (hypertension)    Acute on chronic diastolic CHF (congestive heart failure): Patient has diuresis down to about his dry weight. Await repeat echocardiogram    CKD (chronic kidney disease), stage IV: Patient's creatinines have been in the 2.5 range over the past several months. Previously, about 1.5. Monitor and check urinalysis.    PAF (paroxysmal atrial fibrillation)    Peripheral vascular disease, unspecified    Diabetes mellitus hemoglobin A1c less than 6, and metformin has been stopped due to renal failure.    Thrombocytopenia    NSVT (nonsustained ventricular tachycardia) secondary to above:  None further  Pacemaker in situ  Weakness:  PT eval  Code Status: full Family Communication: none today Disposition Plan: home   Consultants:    Procedures:    Antibiotics:    HPI/Subjective: Feels a little better today.  Still weak and hasn't walked much. No dyspnea  Objective: Filed Vitals:   03/19/13 1613  BP: 123/67  Pulse: 60  Temp: 97.8 F (36.6 C)  Resp: 18    Intake/Output Summary (Last 24 hours) at 03/19/13 1952 Last data filed at 03/19/13 1215  Gross per 24 hour  Intake    240 ml  Output   1600 ml  Net  -1360 ml   Filed Weights   03/17/13 0500 03/18/13 0519 03/19/13 0532  Weight: 74.7 kg (164 lb 10.9 oz) 71.079 kg (156 lb 11.2 oz) 71.532 kg (157 lb 11.2 oz)    telemetry shows paced rhythm  Exam:   General:  Brighter. comfortable  Cardiovascular: Regular rate rhythm without murmurs gallops  Respiratory: Clear to auscultation  bilaterally without wheezes rhonchi or rales  Abdomen: Soft nontender nondistended  Musculoskeletal: No edema  Data Reviewed: Basic Metabolic Panel:  Recent Labs Lab 03/16/13 1848 03/17/13 0534 03/18/13 0600 03/18/13 1230 03/19/13 0405  NA 142 142 140  --  138  K 3.2* 3.6 2.6*  --  3.1*  CL 105 104 100  --  98  CO2 26 30 29   --  31  GLUCOSE 143* 85 110*  --  106*  BUN 27* 26* 30*  --  35*  CREATININE 2.39* 2.20* 2.45*  --  2.63*  CALCIUM 8.8 8.9 8.9  --  8.8  MG  --   --  <0.2*  --  1.2*  PHOS  --   --   --  2.4  --    Liver Function Tests: No results found for this basename: AST, ALT, ALKPHOS, BILITOT, PROT, ALBUMIN,  in the last 168 hours No results found for this basename: LIPASE, AMYLASE,  in the last 168 hours No results found for this basename: AMMONIA,  in the last 168 hours CBC:  Recent Labs Lab 03/16/13 1848 03/17/13 0534 03/18/13 0600  WBC 4.4 5.2 5.7  HGB 6.7* 9.0* 9.2*  HCT 19.3* 25.5* 26.1*  MCV 102.7* 96.6 96.0  PLT 128* 104* 114*   Cardiac Enzymes:  Recent Labs Lab 03/17/13 0534 03/17/13 1101 03/17/13 1700  TROPONINI <0.30 <0.30 <0.30   BNP (last 3 results)  Recent Labs  03/16/13 1848  PROBNP 5934.0*   CBG:  Recent Labs  Lab 03/18/13 1636 03/18/13 2216 03/19/13 0804 03/19/13 1217 03/19/13 1748  GLUCAP 155* 154* 109* 135* 166*    No results found for this or any previous visit (from the past 240 hour(s)).   Studies: No results found.  Scheduled Meds: . amiodarone  100 mg Oral Daily  . amLODipine  5 mg Oral Daily  . aspirin  325 mg Oral Daily  . atorvastatin  80 mg Oral q1800  . cholecalciferol  1,000 Units Oral Daily  . cloNIDine  0.2 mg Oral BID  . clopidogrel  75 mg Oral Q breakfast  . ferrous sulfate  325 mg Oral BID  . furosemide  80 mg Oral Daily  . insulin aspart  0-5 Units Subcutaneous QHS  . insulin aspart  0-9 Units Subcutaneous TID WC  . magnesium oxide  400 mg Oral BID  . metoprolol tartrate  12.5 mg  Oral BID  . pantoprazole  40 mg Oral QHS  . potassium chloride  40 mEq Oral TID  . sodium chloride  3 mL Intravenous Q12H  . sodium chloride  3 mL Intravenous Q12H  . tamsulosin  0.4 mg Oral Daily   Continuous Infusions:   Time spent: 35 min  Vidyuth Belsito L  [facility] Pager [phone]. If 7PM-7AM, please contact night-coverage at [email or web address], password [facility] 03/19/2013, 7:52 PM  LOS: 3 days

## 2013-03-19 NOTE — Progress Notes (Signed)
  Echocardiogram 2D Echocardiogram has been performed.  Cathie Beams 03/19/2013, 3:08 PM

## 2013-03-20 LAB — BASIC METABOLIC PANEL
BUN: 36 mg/dL — ABNORMAL HIGH (ref 6–23)
Chloride: 97 mEq/L (ref 96–112)
GFR calc Af Amer: 26 mL/min — ABNORMAL LOW (ref >90)
Glucose, Bld: 101 mg/dL — ABNORMAL HIGH (ref 70–99)
Potassium: 3.2 mEq/L — ABNORMAL LOW (ref 3.5–5.1)

## 2013-03-20 LAB — GLUCOSE, CAPILLARY: Glucose-Capillary: 133 mg/dL — ABNORMAL HIGH (ref 70–99)

## 2013-03-20 MED ORDER — INFLUENZA VAC SPLIT QUAD 0.5 ML IM SUSP
0.5000 mL | INTRAMUSCULAR | Status: AC
  Administered 2013-03-20: 0.5 mL via INTRAMUSCULAR
  Filled 2013-03-20: qty 0.5

## 2013-03-20 NOTE — Discharge Summary (Signed)
Physician Discharge Summary  Chung Chagoya WUJ:[id number] DOB: [birth year] DOA: 03/16/2013  PCP: Mickie Hillier, MD  Admit date: 03/16/2013 Discharge date: 03/20/2013  Time spent: greater than 30 min  Recommendations for Outpatient Follow-up:  1. Check CBC 2. Check Basic metabolic panel: attention creatinine and Potassium 3. Blood pressure check  Discharge Diagnoses:  Principal Problem:   Decreased hemoglobin Active Problems:   Hypokalemia   Hypomagnesemia   HTN (hypertension), malignant   Acute on chronic diastolic CHF (congestive heart failure)   CKD (chronic kidney disease), stage IV   Anemia of chronic disease   PAF (paroxysmal atrial fibrillation)   Peripheral vascular disease, unspecified   Diabetes mellitus   Thrombocytopenia   NSVT (nonsustained ventricular tachycardia)  Discharge Condition: stable  Filed Weights   03/18/13 0519 03/19/13 0532 03/20/13 0458  Weight: 71.079 kg (156 lb 11.2 oz) 71.532 kg (157 lb 11.2 oz) 72.3 kg (159 lb 6.3 oz)    History of present illness:  [name]. is a 77 y.o. male who was sent by his PCP to the ED after lab results revealed a hemoglobin of 7.1. He has had weakness and fatigue and SOB and chest pain x 3 weeks He has also had increased edema of both lower legs for the past 3 weeks as well. He was evaluated in the ED and was found to have a hemoglobin level of 6.7 and he denies having any melena or hematochezia or hematemesis. He was found to have a Heme Negative FOBT in the ED and thrombocytopenia with platelets of 128. He also was found to have an elevated BNP = 5934.0. He was referred for medical admission.   Hospital Course:   Patient was admitted to telemetry. Transfused one unit of packed red blood cells with appropriate rise in hemoglobin to just over 9. Patient was noted to be about 10 pounds above the weight he was at previously. He was given IV Lasix and Zaroxolyn for a few days. He did have problems with  hypokalemia and profound hypomagnesemia which are being corrected. Repeat echocardiogram showed diastolic dysfunction but good ejection fraction. Anemia panel consistent with chronic disease. Metformin was stopped due to his acute renal failure. Hemoglobin A1c less than 6 cm not required institution of oral hypoglycemic agent during this hospitalization. Blood glucoses have been well controlled off medications. Last month his creatinine was about 2.5, but earlier in the year was about 1.5. He will need monitoring of his creatinine. His ARB has been stopped but his creatinine remains about the same. Patient is currently at his dry weight. Hypomagnesemia has been corrected. Hypokalemia much improved and will go home on continue potassium repletion. He has ambulated with physical therapy. He has no dyspnea or chest pain. He feels back to his baseline and will be discharged home in stable condition.  Discharge Exam: Filed Vitals:   03/20/13 0956  BP: 154/66  Pulse: 60  Temp: 98.3 F (36.8 C)  Resp: 20    General: Alert, oriented. Comfortable Cardiovascular: Regular rate rhythm without murmurs gallops rubs Respiratory: Clear to auscultation bilaterally without wheeze rhonchi or rale Extremities: No clubbing cyanosis or edema  Discharge Instructions  Discharge Orders   Future Appointments Provider Department Dept Phone   08/13/2013 9:00 AM [facility] [phone]   Eat a light meal the night before the exam but please avoid gaseous foods  Nothing to eat or drink for at least 8 hours prior to the exam  No gum chewing or smoking the morning of the exam Please take your morning medications with small sips of water, especially blood pressure medication If you have several vascular lab exams and will see physician, please bring a snack with you   08/13/2013 9:45 AM Nada Libman, MD [facility] [phone]   Future Orders Complete  By Expires   Diet - low sodium heart healthy  As directed    Diet Carb Modified  As directed    Increase activity slowly  As directed        Medication List    STOP taking these medications       losartan 100 MG tablet  Commonly known as:  COZAAR      TAKE these medications       amiodarone 200 MG tablet  Commonly known as:  PACERONE  Take 100 mg by mouth daily.     amLODipine 5 MG tablet  Commonly known as:  NORVASC  Take 5 mg by mouth daily.     ARTHRITIS PAIN RELIEF 650 MG CR tablet  Generic drug:  acetaminophen  Take 1,300 mg by mouth 2 (two) times daily.     aspirin 325 MG tablet  Take 325 mg by mouth daily.     atorvastatin 80 MG tablet  Commonly known as:  LIPITOR  Take 80 mg by mouth daily.     CALCIUM PO  Take 2,000 mg by mouth daily.     cholecalciferol 1000 UNITS tablet  Commonly known as:  VITAMIN D  Take 1,000 Units by mouth daily.     cloNIDine 0.2 MG tablet  Commonly known as:  CATAPRES  Take 0.2 mg by mouth 2 (two) times daily.     clopidogrel 75 MG tablet  Commonly known as:  PLAVIX  Take 75 mg by mouth daily.     ferrous sulfate 325 (65 FE) MG tablet  Take 325 mg by mouth 2 (two) times daily.     furosemide 80 MG tablet  Commonly known as:  LASIX  Take 80 mg by mouth daily.     LUPRON DEPOT IM  Inject into the muscle See admin instructions. Every 4 months as needed     metFORMIN 500 MG tablet  Commonly known as:  GLUCOPHAGE  Take 1,000 mg by mouth 2 (two) times daily with a meal.     metoprolol tartrate 25 MG tablet  Commonly known as:  LOPRESSOR  Take 12.5 mg by mouth 2 (two) times daily.     pantoprazole 40 MG tablet  Commonly known as:  PROTONIX  Take 40 mg by mouth daily.     potassium chloride SA 20 MEQ tablet  Commonly known as:  K-DUR,KLOR-CON  Take 20-40 mEq by mouth 2 (two) times daily. 20 meq in the evening and 40 meq in the a.m.     silodosin 8 MG Caps capsule  Commonly known as:  RAPAFLO  Take 8 mg by mouth  daily.       Allergies  Allergen Reactions  . Ace Inhibitors Cough  . Ibuprofen Other (See Comments)    GI Issues       Follow-up Information   Follow up with Mickie Hillier, MD In 1 week. (to check CBC and BMET)    Specialty:  Family Medicine   Contact information:   [address] [place] [address] [phone]        The results of significant diagnostics from this hospitalization (  including imaging, microbiology, ancillary and laboratory) are listed below for reference.    Significant Diagnostic Studies: No results found.  Microbiology: No results found for this or any previous visit (from the past 240 hour(s)).   Labs: Basic Metabolic Panel:  Recent Labs Lab 03/16/13 1848 03/17/13 0534 03/18/13 0600 03/18/13 1230 03/19/13 0405 03/20/13 0430  NA 142 142 140  --  138 136  K 3.2* 3.6 2.6*  --  3.1* 3.2*  CL 105 104 100  --  98 97  CO2 26 30 29   --  31 31  GLUCOSE 143* 85 110*  --  106* 101*  BUN 27* 26* 30*  --  35* 36*  CREATININE 2.39* 2.20* 2.45*  --  2.63* 2.61*  CALCIUM 8.8 8.9 8.9  --  8.8 8.5  MG  --   --  <0.2*  --  1.2* 2.0  PHOS  --   --   --  2.4  --   --    Liver Function Tests: No results found for this basename: AST, ALT, ALKPHOS, BILITOT, PROT, ALBUMIN,  in the last 168 hours No results found for this basename: LIPASE, AMYLASE,  in the last 168 hours No results found for this basename: AMMONIA,  in the last 168 hours CBC:  Recent Labs Lab 03/16/13 1848 03/17/13 0534 03/18/13 0600  WBC 4.4 5.2 5.7  HGB 6.7* 9.0* 9.2*  HCT 19.3* 25.5* 26.1*  MCV 102.7* 96.6 96.0  PLT 128* 104* 114*   Cardiac Enzymes:  Recent Labs Lab 03/17/13 0534 03/17/13 1101 03/17/13 1700  TROPONINI <0.30 <0.30 <0.30   BNP: BNP (last 3 results)  Recent Labs  03/16/13 1848  PROBNP 5934.0*   CBG:  Recent Labs Lab 03/19/13 0804 03/19/13 1217 03/19/13 1748 03/19/13 2143 03/20/13 0820  GLUCAP 109* 135* 166* 157* 95     Echo: Left ventricle: The cavity size was normal. There was moderate concentric hypertrophy. Systolic function was normal. The estimated ejection fraction was 55%. Wall motion was normal; there were no regional wall motion abnormalities. There was an increased relative contribution of atrial contraction to ventricular filling. Doppler parameters are consistent with abnormal left ventricular relaxation (grade 1 diastolic dysfunction). - Aortic valve: Possibly bicuspid; moderately thickened, mildly calcified leaflets. Moderate diffuse thickening and calcification. - Mitral valve: Mild regurgitation. - Right ventricle: The cavity size was mildly dilated. Wall thickness was normal.   Signed:  Asianna Brundage L  [facility] 03/20/2013, 10:28 AM

## 2013-03-20 NOTE — Care Management Note (Signed)
    Page 1 of 1   03/20/2013     11:13:34 AM   CARE MANAGEMENT NOTE 03/20/2013  Patient:  [name], [name]   Account Number:  [id number]  Date Initiated:  03/20/2013  Documentation initiated by:  Letha Cape  Subjective/Objective Assessment:   dx decreased hgb  admit- lives with spouse, pta indep.     Action/Plan:   pt eval- no pt f/u needed.   Anticipated DC Date:  03/20/2013   Anticipated DC Plan:  HOME/SELF CARE      DC Planning Services  CM consult      Choice offered to / List presented to:             Status of service:  Completed, signed off Medicare Important Message given?   (If response is "NO", the following Medicare IM given date fields will be blank) Date Medicare IM given:   Date Additional Medicare IM given:    Discharge Disposition:  HOME/SELF CARE  Per UR Regulation:  Reviewed for med. necessity/level of care/duration of stay  If discussed at Long Length of Stay Meetings, dates discussed:    Comments:  03/20/13 11:08 Letha Cape RN, BSN [phone] patient lives with spouse, patient is for dc today, per physical therapy patient has no pt needs.  Patient states he has medication coverage and transportation at dc.  His wife will be transporting him home.

## 2013-03-20 NOTE — Progress Notes (Signed)
Dennie Fetters Sr. discharged Home per MD order.  Discharge instructions reviewed and discussed with the patient, all questions and concerns answered. Copy of instructions and scripts given to patient.    Medication List    STOP taking these medications       losartan 100 MG tablet  Commonly known as:  COZAAR      TAKE these medications       amiodarone 200 MG tablet  Commonly known as:  PACERONE  Take 100 mg by mouth daily.     amLODipine 5 MG tablet  Commonly known as:  NORVASC  Take 5 mg by mouth daily.     ARTHRITIS PAIN RELIEF 650 MG CR tablet  Generic drug:  acetaminophen  Take 1,300 mg by mouth 2 (two) times daily.     aspirin 325 MG tablet  Take 325 mg by mouth daily.     atorvastatin 80 MG tablet  Commonly known as:  LIPITOR  Take 80 mg by mouth daily.     CALCIUM PO  Take 2,000 mg by mouth daily.     cholecalciferol 1000 UNITS tablet  Commonly known as:  VITAMIN D  Take 1,000 Units by mouth daily.     cloNIDine 0.2 MG tablet  Commonly known as:  CATAPRES  Take 0.2 mg by mouth 2 (two) times daily.     clopidogrel 75 MG tablet  Commonly known as:  PLAVIX  Take 75 mg by mouth daily.     ferrous sulfate 325 (65 FE) MG tablet  Take 325 mg by mouth 2 (two) times daily.     furosemide 80 MG tablet  Commonly known as:  LASIX  Take 80 mg by mouth daily.     LUPRON DEPOT IM  Inject into the muscle See admin instructions. Every 4 months as needed     metFORMIN 500 MG tablet  Commonly known as:  GLUCOPHAGE  Take 1,000 mg by mouth 2 (two) times daily with a meal.     metoprolol tartrate 25 MG tablet  Commonly known as:  LOPRESSOR  Take 12.5 mg by mouth 2 (two) times daily.     pantoprazole 40 MG tablet  Commonly known as:  PROTONIX  Take 40 mg by mouth daily.     potassium chloride SA 20 MEQ tablet  Commonly known as:  K-DUR,KLOR-CON  Take 20-40 mEq by mouth 2 (two) times daily. 20 meq in the evening and 40 meq in the a.m.     silodosin 8 MG  Caps capsule  Commonly known as:  RAPAFLO  Take 8 mg by mouth daily.        Patients skin is clean, dry and intact, no evidence of skin break down. IV site discontinued and catheter remains intact. Site without signs and symptoms of complications. Dressing and pressure applied.  Patient escorted to car by NT in a wheelchair,  no distress noted upon discharge.  Laural Benes, Sumit Branham C 03/20/2013 3:14 PM

## 2013-04-04 ENCOUNTER — Telehealth: Payer: Self-pay | Admitting: Hematology and Oncology

## 2013-04-04 NOTE — Telephone Encounter (Signed)
S/W PT IN REF TO NP APPT. ON 04/24/13@9 :30 REFERRING DR Valente David MAILED NP PACKET

## 2013-04-04 NOTE — Telephone Encounter (Signed)
C/D 04/04/13 for appt. 04/24/13

## 2013-04-17 ENCOUNTER — Other Ambulatory Visit (INDEPENDENT_AMBULATORY_CARE_PROVIDER_SITE_OTHER): Payer: Medicare Other

## 2013-04-17 ENCOUNTER — Other Ambulatory Visit: Payer: Self-pay | Admitting: Interventional Cardiology

## 2013-04-17 ENCOUNTER — Other Ambulatory Visit: Payer: Self-pay

## 2013-04-17 DIAGNOSIS — E876 Hypokalemia: Secondary | ICD-10-CM

## 2013-04-17 LAB — BASIC METABOLIC PANEL
BUN: 35 mg/dL — ABNORMAL HIGH (ref 6–23)
CO2: 27 mEq/L (ref 19–32)
Calcium: 9.4 mg/dL (ref 8.4–10.5)
Creatinine, Ser: 2.4 mg/dL — ABNORMAL HIGH (ref 0.4–1.5)

## 2013-04-18 ENCOUNTER — Telehealth: Payer: Self-pay

## 2013-04-18 NOTE — Telephone Encounter (Signed)
Message copied by Jarvis Newcomer on Wed Apr 18, 2013  9:35 AM ------      Message from: Verdis Prime      Created: Tue Apr 17, 2013  6:09 PM       Potassium is now normal. Continue current therapy ------

## 2013-04-23 ENCOUNTER — Other Ambulatory Visit: Payer: Self-pay | Admitting: Hematology and Oncology

## 2013-04-24 ENCOUNTER — Telehealth: Payer: Self-pay | Admitting: *Deleted

## 2013-04-24 ENCOUNTER — Ambulatory Visit (HOSPITAL_BASED_OUTPATIENT_CLINIC_OR_DEPARTMENT_OTHER): Payer: Medicare Other

## 2013-04-24 ENCOUNTER — Ambulatory Visit (HOSPITAL_BASED_OUTPATIENT_CLINIC_OR_DEPARTMENT_OTHER): Payer: Medicare Other | Admitting: Hematology and Oncology

## 2013-04-24 ENCOUNTER — Other Ambulatory Visit: Payer: Self-pay | Admitting: Hematology and Oncology

## 2013-04-24 ENCOUNTER — Ambulatory Visit (HOSPITAL_COMMUNITY)
Admission: RE | Admit: 2013-04-24 | Discharge: 2013-04-24 | Disposition: A | Discharge status: Home / Self Care | Payer: Medicare Other | Source: Ambulatory Visit | Attending: Hematology and Oncology | Admitting: Hematology and Oncology

## 2013-04-24 ENCOUNTER — Ambulatory Visit: Payer: Medicare Other

## 2013-04-24 ENCOUNTER — Other Ambulatory Visit (HOSPITAL_BASED_OUTPATIENT_CLINIC_OR_DEPARTMENT_OTHER): Payer: Medicare Other | Admitting: Lab

## 2013-04-24 ENCOUNTER — Encounter: Payer: Self-pay | Admitting: Hematology and Oncology

## 2013-04-24 VITALS — BP 170/69 | HR 60 | Temp 98.2°F | Resp 20 | Wt 176.3 lb

## 2013-04-24 DIAGNOSIS — D638 Anemia in other chronic diseases classified elsewhere: Secondary | ICD-10-CM | POA: Insufficient documentation

## 2013-04-24 DIAGNOSIS — D649 Anemia, unspecified: Secondary | ICD-10-CM

## 2013-04-24 DIAGNOSIS — D696 Thrombocytopenia, unspecified: Secondary | ICD-10-CM

## 2013-04-24 DIAGNOSIS — C61 Malignant neoplasm of prostate: Secondary | ICD-10-CM

## 2013-04-24 LAB — COMPREHENSIVE METABOLIC PANEL (CC13)
ALT: 8 U/L (ref 0–55)
Albumin: 3.5 g/dL (ref 3.5–5.0)
Anion Gap: 12 mEq/L — ABNORMAL HIGH (ref 3–11)
CO2: 27 mEq/L (ref 22–29)
Chloride: 108 mEq/L (ref 98–109)
Glucose: 95 mg/dl (ref 70–140)
Potassium: 3.1 mEq/L — ABNORMAL LOW (ref 3.5–5.1)
Sodium: 147 mEq/L — ABNORMAL HIGH (ref 136–145)
Total Bilirubin: 0.32 mg/dL (ref 0.20–1.20)
Total Protein: 7.1 g/dL (ref 6.4–8.3)

## 2013-04-24 LAB — ABO/RH: ABO/RH(D): O POS

## 2013-04-24 LAB — MORPHOLOGY

## 2013-04-24 LAB — CBC & DIFF AND RETIC
EOS%: 3.7 % (ref 0.0–7.0)
Eosinophils Absolute: 0.3 10*3/uL (ref 0.0–0.5)
HCT: 23.4 % — ABNORMAL LOW (ref 38.4–49.9)
Immature Retic Fract: 5.7 % (ref 3.00–10.60)
LYMPH%: 12.4 % — ABNORMAL LOW (ref 14.0–49.0)
MCV: 102.6 fL — ABNORMAL HIGH (ref 79.3–98.0)
MONO#: 0.7 10*3/uL (ref 0.1–0.9)
NEUT#: 6.1 10*3/uL (ref 1.5–6.5)
NEUT%: 75 % (ref 39.0–75.0)
Platelets: 121 10*3/uL — ABNORMAL LOW (ref 140–400)
WBC: 8.1 10*3/uL (ref 4.0–10.3)

## 2013-04-24 LAB — HOLD TUBE, BLOOD BANK

## 2013-04-24 NOTE — Progress Notes (Signed)
[facility] CONSULT [facility]  Patient Care Team: Catha Gosselin, MD as PCP - General (Family Medicine) Lesleigh Noe, MD (Cardiology) Vertell Novak., MD (Gastroenterology) Lindaann Slough, MD (Urology)  CHIEF COMPLAINTS/PURPOSE OF CONSULTATION:  Chronic anemia and thrombocytopenia  HISTORY OF PRESENTING ILLNESS:  [name] Fetters Sr. 77 y.o. male is here because of abnormal CBC.This is a very pleasant 58 year gentleman accompanied by his wife today. I have reviewed his records extensively and collaborated the history with the patient. This is a very interesting gentleman with a history of prostate cancer diagnosed in 1997, status post radiation and seed implantation and Lupron injection. The patient cannot recall the stage of his disease. He's been getting Lupron injection every 4 months, the next one will be due and if this month. He does not recall his last PSA number but was never told he require urgent attention of further treatment apart from just getting Lupron injection. He was also found to have chronic anemia in the past and has been placed on oral ion supplements 4 years of which he took twice a day. His last ferritin level was over 1300. The patient also has extensive EGD and colonoscopy done which showed no evidence of active bleeding. He had B12 level checked recently and was found to have B12 deficiency and was started on B12 injections. The patient also has significant anemia requiring hospitalization and recent blood transfusion. The patient has donated blood many years ago. He eats a variety of diets and denies any pica. He denies taking any nonsteroidal anti-inflammatory medications. From the anemia, he denies any dizziness, lightheadedness, abdominal cramps, restless leg, shortness of breath or chest pain.  He denies any recent bleeding such as epistaxis, hematuria, or hematochezia  MEDICAL HISTORY:  Past Medical History  Diagnosis Date  . Diabetes mellitus    . CAD (coronary artery disease)     s/b CABG '94  . Hypertension   . Anemia     Macrocytic  . Peripheral vascular disease   . Hyperlipidemia   . Cancer     prostate  . Ulcer   . GERD (gastroesophageal reflux disease)   . Liddle's syndrome   . Hemorrhoids   . Intestinal ischemia   . DVT (deep venous thrombosis)   . Atrial fibrillation     Paroxysmal. S/p PPM  . CHF (congestive heart failure)   . Prostate cancer 1997    XRT and lupron  . Anemia, unspecified 04/24/2013  . Thrombocytopenia, unspecified 04/24/2013    SURGICAL HISTORY: Past Surgical History  Procedure Laterality Date  . Hiatal hernia repair      and ulcer repair  . Appendectomy    . Pacemaker insertion    . Coronary artery bypass graft  01/22/1993  . Cholecystectomy  Oct 2009    Gall Bladder  . Coronary artery bypass graft  01/03/2012    Procedure: REDO CORONARY ARTERY BYPASS GRAFTING (CABG);  Surgeon: Alleen Borne, MD;  Location: [facility] OR;  Service: Open Heart Surgery;  Laterality: N/A;  Redo CABG x  using bilateral internal mammary arteries;  left leg greater saphenous vein harvested endoscopically  . Celiac artery angioplasty  05-16-12    and stenting    SOCIAL HISTORY: History   Social History  . Marital Status: Married    Spouse Name: N/A    Number of Children: N/A  . Years of Education: N/A   Occupational History  . Not on file.   Social History  Main Topics  . Smoking status: Former Smoker    Types: Cigarettes    Quit date: 07/12/1977  . Smokeless tobacco: Never Used  . Alcohol Use: No  . Drug Use: No  . Sexual Activity: Yes   Other Topics Concern  . Not on file   Social History Narrative  . No narrative on file    FAMILY HISTORY: Family History  Problem Relation Age of Onset  . Diabetes Mother   . Heart disease Mother     Heart Disease before age 25  . Hyperlipidemia Mother   . Hypertension Mother   . Cancer Father     stomach/liver  . Cancer Sister     Breast cancer  .  Diabetes Sister   . Heart disease Daughter     Heart Disease before age 60  . Hypertension Daughter   . Heart attack Daughter     ALLERGIES:  is allergic to ace inhibitors and ibuprofen.  MEDICATIONS:  Current Outpatient Prescriptions  Medication Sig Dispense Refill  . acetaminophen (ARTHRITIS PAIN RELIEF) 650 MG CR tablet Take 1,300 mg by mouth 2 (two) times daily.       Marland Kitchen amiodarone (PACERONE) 200 MG tablet Take 100 mg by mouth daily.      Marland Kitchen amLODipine (NORVASC) 5 MG tablet Take 5 mg by mouth daily.       Marland Kitchen aspirin 325 MG tablet Take 325 mg by mouth daily.      Marland Kitchen atorvastatin (LIPITOR) 80 MG tablet Take 80 mg by mouth daily.      Marland Kitchen CALCIUM PO Take 2,000 mg by mouth daily.       . cholecalciferol (VITAMIN D) 1000 UNITS tablet Take 1,000 Units by mouth daily.       . cloNIDine (CATAPRES) 0.2 MG tablet Take 0.2 mg by mouth 2 (two) times daily.       . clopidogrel (PLAVIX) 75 MG tablet Take 75 mg by mouth daily.      . Cyanocobalamin (VITAMIN B-12 IJ) Inject as directed.      . furosemide (LASIX) 80 MG tablet Take 80 mg by mouth daily.       Marland Kitchen glucose blood test strip       . metoprolol tartrate (LOPRESSOR) 25 MG tablet Take 12.5 mg by mouth 2 (two) times daily.       . pantoprazole (PROTONIX) 40 MG tablet Take 40 mg by mouth daily.      . potassium chloride SA (K-DUR,KLOR-CON) 20 MEQ tablet Take 20-40 mEq by mouth 2 (two) times daily. 20 meq in the evening and 40 meq in the a.m.      Marland Kitchen silodosin (RAPAFLO) 8 MG CAPS capsule Take 8 mg by mouth daily.       . ferrous sulfate 325 (65 FE) MG tablet Take 325 mg by mouth 2 (two) times daily.      Marland Kitchen Leuprolide Acetate (LUPRON DEPOT IM) Inject into the muscle See admin instructions. Every 4 months as needed       No current facility-administered medications for this visit.    REVIEW OF SYSTEMS:   Constitutional: Denies fevers, chills or abnormal night sweats Eyes: Denies blurriness of vision, double vision or watery eyes Ears, nose, mouth,  throat, and face: Denies mucositis or sore throat Respiratory: Denies cough, dyspnea or wheezes Cardiovascular: Denies palpitation, chest discomfort or lower extremity swelling Gastrointestinal:  Denies nausea, heartburn or change in bowel habits Skin: Denies abnormal skin rashes Lymphatics: Denies new lymphadenopathy or  easy bruising Neurological:Denies numbness, tingling or new weaknesses Behavioral/Psych: Mood is stable, no new changes  All other systems were reviewed with the patient and are negative.  PHYSICAL EXAMINATION: ECOG PERFORMANCE STATUS: 1 - Symptomatic but completely ambulatory  Filed Vitals:   04/24/13 1002  BP: 170/69  Pulse: 60  Temp: 98.2 F (36.8 C)  Resp: 20   Filed Weights   04/24/13 1002  Weight: 176 lb 4.8 oz (79.969 kg)    GENERAL:alert, no distress and comfortable SKIN: skin color, texture, turgor are normal, no rashes or significant lesions EYES: normal, conjunctiva are pale and non-injected, sclera clear OROPHARYNX:no exudate, no erythema and lips, buccal mucosa, and tongue normal  NECK: supple, thyroid normal size, non-tender, without nodularity LYMPH:  no palpable lymphadenopathy in the cervical, axillary or inguinal LUNGS: clear to auscultation and percussion with normal breathing effort HEART: regular rate & rhythm and no murmurs and no lower extremity edema ABDOMEN:abdomen soft, non-tender and normal bowel sounds Musculoskeletal:no cyanosis of digits and no clubbing  PSYCH: alert & oriented x 3 with fluent speech NEURO: no focal motor/sensory deficits  LABORATORY DATA:  I have reviewed the data as listed Recent Results (from the past 2160 hour(s))  POCT I-STAT, CHEM 8     Status: Abnormal   Collection Time    02/13/13 10:56 AM      Result Value Range   Sodium 144  135 - 145 mEq/L   Potassium 3.4 (*) 3.5 - 5.1 mEq/L   Chloride 101  96 - 112 mEq/L   BUN 47 (*) 6 - 23 mg/dL   Creatinine, Ser 4.40 (*) 0.50 - 1.35 mg/dL   Glucose, Bld 97   70 - 99 mg/dL   Calcium, Ion 1.02 (*) 1.13 - 1.30 mmol/L   TCO2 30  0 - 100 mmol/L   Hemoglobin 10.9 (*) 13.0 - 17.0 g/dL   HCT 72.5 (*) 36.6 - 44.0 %  POCT ACTIVATED CLOTTING TIME     Status: None   Collection Time    02/13/13  1:53 PM      Result Value Range   Activated Clotting Time 181    POCT ACTIVATED CLOTTING TIME     Status: None   Collection Time    02/13/13  2:23 PM      Result Value Range   Activated Clotting Time 206    POCT ACTIVATED CLOTTING TIME     Status: None   Collection Time    02/13/13  2:41 PM      Result Value Range   Activated Clotting Time 206    POCT ACTIVATED CLOTTING TIME     Status: None   Collection Time    02/13/13  3:47 PM      Result Value Range   Activated Clotting Time 186    GLUCOSE, CAPILLARY     Status: None   Collection Time    02/13/13  4:54 PM      Result Value Range   Glucose-Capillary 95  70 - 99 mg/dL  CBC     Status: Abnormal   Collection Time    03/16/13  6:48 PM      Result Value Range   WBC 4.4  4.0 - 10.5 K/uL   RBC 1.88 (*) 4.22 - 5.81 MIL/uL   Hemoglobin 6.7 (*) 13.0 - 17.0 g/dL   Comment: REPEATED TO VERIFY     CRITICAL RESULT CALLED TO, READ BACK BY AND VERIFIED WITH:     RN HENSON, H.  03/16/13 1938 KERAN M.   HCT 19.3 (*) 39.0 - 52.0 %   MCV 102.7 (*) 78.0 - 100.0 fL   MCH 35.6 (*) 26.0 - 34.0 pg   MCHC 34.7  30.0 - 36.0 g/dL   RDW 09.8  11.9 - 14.7 %   Platelets 128 (*) 150 - 400 K/uL  BASIC METABOLIC PANEL     Status: Abnormal   Collection Time    03/16/13  6:48 PM      Result Value Range   Sodium 142  135 - 145 mEq/L   Potassium 3.2 (*) 3.5 - 5.1 mEq/L   Chloride 105  96 - 112 mEq/L   CO2 26  19 - 32 mEq/L   Glucose, Bld 143 (*) 70 - 99 mg/dL   BUN 27 (*) 6 - 23 mg/dL   Creatinine, Ser 8.29 (*) 0.50 - 1.35 mg/dL   Calcium 8.8  8.4 - 56.2 mg/dL   GFR calc non Af Amer 25 (*) >90 mL/min   GFR calc Af Amer 29 (*) >90 mL/min   Comment: (NOTE)     The eGFR has been calculated using the CKD EPI equation.      This calculation has not been validated in all clinical situations.     eGFR's persistently <90 mL/min signify possible Chronic Kidney     Disease.  PRO B NATRIURETIC PEPTIDE     Status: Abnormal   Collection Time    03/16/13  6:48 PM      Result Value Range   Pro B Natriuretic peptide (BNP) 5934.0 (*) 0 - 450 pg/mL  POCT I-STAT TROPONIN I     Status: None   Collection Time    03/16/13  6:54 PM      Result Value Range   Troponin i, poc 0.02  0.00 - 0.08 ng/mL   Comment 3            Comment: Due to the release kinetics of cTnI,     a negative result within the first hours     of the onset of symptoms does not rule out     myocardial infarction with certainty.     If myocardial infarction is still suspected,     repeat the test at appropriate intervals.  TYPE AND SCREEN     Status: None   Collection Time    03/16/13  8:09 PM      Result Value Range   ABO/RH(D) O POS     Antibody Screen NEG     Sample Expiration 03/19/2013     Unit Number [id number]     Blood Component Type RED CELLS,LR     Unit division 00     Status of Unit ISSUED,FINAL     Transfusion Status OK TO TRANSFUSE     Crossmatch Result Compatible     Unit Number [id number]     Blood Component Type RED CELLS,LR     Unit division 00     Status of Unit ISSUED,FINAL     Transfusion Status OK TO TRANSFUSE     Crossmatch Result Compatible    OCCULT BLOOD, POC DEVICE     Status: None   Collection Time    03/16/13  9:21 PM      Result Value Range   Fecal Occult Bld NEGATIVE  NEGATIVE  PREPARE RBC (CROSSMATCH)     Status: None   Collection Time    03/16/13 10:00 PM      Result Value  Range   Order Confirmation ORDER PROCESSED BY BLOOD BANK    GLUCOSE, CAPILLARY     Status: None   Collection Time    03/17/13 12:35 AM      Result Value Range   Glucose-Capillary 99  70 - 99 mg/dL  BASIC METABOLIC PANEL     Status: Abnormal   Collection Time    03/17/13  5:34 AM      Result Value Range   Sodium 142  135 -  145 mEq/L   Potassium 3.6  3.5 - 5.1 mEq/L   Chloride 104  96 - 112 mEq/L   CO2 30  19 - 32 mEq/L   Glucose, Bld 85  70 - 99 mg/dL   BUN 26 (*) 6 - 23 mg/dL   Creatinine, Ser 9.81 (*) 0.50 - 1.35 mg/dL   Calcium 8.9  8.4 - 19.1 mg/dL   GFR calc non Af Amer 28 (*) >90 mL/min   GFR calc Af Amer 32 (*) >90 mL/min   Comment: (NOTE)     The eGFR has been calculated using the CKD EPI equation.     This calculation has not been validated in all clinical situations.     eGFR's persistently <90 mL/min signify possible Chronic Kidney     Disease.  CBC     Status: Abnormal   Collection Time    03/17/13  5:34 AM      Result Value Range   WBC 5.2  4.0 - 10.5 K/uL   RBC 2.64 (*) 4.22 - 5.81 MIL/uL   Hemoglobin 9.0 (*) 13.0 - 17.0 g/dL   Comment: POST TRANSFUSION SPECIMEN   HCT 25.5 (*) 39.0 - 52.0 %   MCV 96.6  78.0 - 100.0 fL   MCH 34.1 (*) 26.0 - 34.0 pg   MCHC 35.3  30.0 - 36.0 g/dL   RDW 47.8 (*) 29.5 - 62.1 %   Platelets 104 (*) 150 - 400 K/uL   Comment: PLATELET COUNT CONFIRMED BY SMEAR  PROTIME-INR     Status: None   Collection Time    03/17/13  5:34 AM      Result Value Range   Prothrombin Time 13.6  11.6 - 15.2 seconds   INR 1.06  0.00 - 1.49  APTT     Status: None   Collection Time    03/17/13  5:34 AM      Result Value Range   aPTT 31  24 - 37 seconds  TROPONIN I     Status: None   Collection Time    03/17/13  5:34 AM      Result Value Range   Troponin I <0.30  <0.30 ng/mL   Comment:            Due to the release kinetics of cTnI,     a negative result within the first hours     of the onset of symptoms does not rule out     myocardial infarction with certainty.     If myocardial infarction is still suspected,     repeat the test at appropriate intervals.  VITAMIN B12     Status: None   Collection Time    03/17/13  5:34 AM      Result Value Range   Vitamin B-12 271  211 - 911 pg/mL   Comment: Performed at [facility]  IRON AND TIBC     Status: Abnormal    Collection Time    03/17/13  5:34 AM      Result Value Range   Iron 77  42 - 135 ug/dL   TIBC 578 (*) 469 - 629 ug/dL   Saturation Ratios 53  20 - 55 %   UIBC 67 (*) 125 - 400 ug/dL   Comment: Performed at [facility]  FERRITIN     Status: Abnormal   Collection Time    03/17/13  5:34 AM      Result Value Range   Ferritin 1309 (*) 22 - 322 ng/mL   Comment: Performed at [facility]  FOLATE     Status: None   Collection Time    03/17/13  5:34 AM      Result Value Range   Folate 9.2     Comment: (NOTE)     Reference Ranges            Deficient:       0.4 - 3.3 ng/mL            Indeterminate:   3.4 - 5.4 ng/mL            Normal:              > 5.4 ng/mL     Performed at [facility]  HEMOGLOBIN A1C     Status: Abnormal   Collection Time    03/17/13  5:34 AM      Result Value Range   Hemoglobin A1C 5.7 (*) <5.7 %   Comment: (NOTE)                                                                               According to the ADA Clinical Practice Recommendations for 2011, when     HbA1c is used as a screening test:      >=6.5%   Diagnostic of Diabetes Mellitus               (if abnormal result is confirmed)     5.7-6.4%   Increased risk of developing Diabetes Mellitus     References:Diagnosis and Classification of Diabetes Mellitus,Diabetes     Care,2011,34(Suppl 1):S62-S69 and Standards of Medical Care in             Diabetes - 2011,Diabetes Care,2011,34 (Suppl 1):S11-S61.   Mean Plasma Glucose 117 (*) <117 mg/dL   Comment: Performed at [facility]  RETICULOCYTES     Status: Abnormal   Collection Time    03/17/13  5:34 AM      Result Value Range   Retic Ct Pct 1.3  0.4 - 3.1 %   RBC. 2.64 (*) 4.22 - 5.81 MIL/uL   Retic Count, Manual 34.3  19.0 - 186.0 K/uL  GLUCOSE, CAPILLARY     Status: None   Collection Time    03/17/13  7:44 AM      Result Value Range   Glucose-Capillary 82  70 - 99 mg/dL  TROPONIN I     Status: None    Collection Time    03/17/13 11:01 AM      Result Value Range   Troponin I <0.30  <0.30 ng/mL   Comment:  Due to the release kinetics of cTnI,     a negative result within the first hours     of the onset of symptoms does not rule out     myocardial infarction with certainty.     If myocardial infarction is still suspected,     repeat the test at appropriate intervals.  GLUCOSE, CAPILLARY     Status: Abnormal   Collection Time    03/17/13 12:24 PM      Result Value Range   Glucose-Capillary 114 (*) 70 - 99 mg/dL  TROPONIN I     Status: None   Collection Time    03/17/13  5:00 PM      Result Value Range   Troponin I <0.30  <0.30 ng/mL   Comment:            Due to the release kinetics of cTnI,     a negative result within the first hours     of the onset of symptoms does not rule out     myocardial infarction with certainty.     If myocardial infarction is still suspected,     repeat the test at appropriate intervals.  GLUCOSE, CAPILLARY     Status: Abnormal   Collection Time    03/17/13  5:30 PM      Result Value Range   Glucose-Capillary 134 (*) 70 - 99 mg/dL  GLUCOSE, CAPILLARY     Status: Abnormal   Collection Time    03/17/13  9:37 PM      Result Value Range   Glucose-Capillary 135 (*) 70 - 99 mg/dL   Comment 1 Notify RN    BASIC METABOLIC PANEL     Status: Abnormal   Collection Time    03/18/13  6:00 AM      Result Value Range   Sodium 140  135 - 145 mEq/L   Potassium 2.6 (*) 3.5 - 5.1 mEq/L   Comment: CRITICAL RESULT CALLED TO, READ BACK BY AND VERIFIED WITH:     [id number] [id number] MCCAULEG   Chloride 100  96 - 112 mEq/L   CO2 29  19 - 32 mEq/L   Glucose, Bld 110 (*) 70 - 99 mg/dL   BUN 30 (*) 6 - 23 mg/dL   Creatinine, Ser 1.47 (*) 0.50 - 1.35 mg/dL   Calcium 8.9  8.4 - 82.9 mg/dL   GFR calc non Af Amer 24 (*) >90 mL/min   GFR calc Af Amer 28 (*) >90 mL/min   Comment: (NOTE)     The eGFR has been calculated using the CKD EPI equation.     This  calculation has not been validated in all clinical situations.     eGFR's persistently <90 mL/min signify possible Chronic Kidney     Disease.  CBC     Status: Abnormal   Collection Time    03/18/13  6:00 AM      Result Value Range   WBC 5.7  4.0 - 10.5 K/uL   RBC 2.72 (*) 4.22 - 5.81 MIL/uL   Hemoglobin 9.2 (*) 13.0 - 17.0 g/dL   HCT 56.2 (*) 13.0 - 86.5 %   MCV 96.0  78.0 - 100.0 fL   MCH 33.8  26.0 - 34.0 pg   MCHC 35.2  30.0 - 36.0 g/dL   RDW 78.4 (*) 69.6 - 29.5 %   Platelets 114 (*) 150 - 400 K/uL   Comment: CONSISTENT WITH PREVIOUS RESULT  MAGNESIUM  Status: Abnormal   Collection Time    03/18/13  6:00 AM      Result Value Range   Magnesium <0.2 (*) 1.5 - 2.5 mg/dL   Comment: CRITICAL RESULT CALLED TO, READ BACK BY AND VERIFIED WITH:     J.VHU,RN 1610 03/18/13 M.CAMPBELL  GLUCOSE, CAPILLARY     Status: None   Collection Time    03/18/13  7:51 AM      Result Value Range   Glucose-Capillary 96  70 - 99 mg/dL  GLUCOSE, CAPILLARY     Status: Abnormal   Collection Time    03/18/13 12:06 PM      Result Value Range   Glucose-Capillary 135 (*) 70 - 99 mg/dL  PHOSPHORUS     Status: None   Collection Time    03/18/13 12:30 PM      Result Value Range   Phosphorus 2.4  2.3 - 4.6 mg/dL  TSH     Status: Abnormal   Collection Time    03/18/13 12:30 PM      Result Value Range   TSH 5.152 (*) 0.350 - 4.500 uIU/mL   Comment: Performed at [facility]  GLUCOSE, CAPILLARY     Status: Abnormal   Collection Time    03/18/13  4:36 PM      Result Value Range   Glucose-Capillary 155 (*) 70 - 99 mg/dL  URINALYSIS, ROUTINE W REFLEX MICROSCOPIC     Status: None   Collection Time    03/18/13  5:46 PM      Result Value Range   Color, Urine YELLOW  YELLOW   APPearance CLEAR  CLEAR   Specific Gravity, Urine 1.009  1.005 - 1.030   pH 5.0  5.0 - 8.0   Glucose, UA NEGATIVE  NEGATIVE mg/dL   Hgb urine dipstick NEGATIVE  NEGATIVE   Bilirubin Urine NEGATIVE  NEGATIVE    Ketones, ur NEGATIVE  NEGATIVE mg/dL   Protein, ur NEGATIVE  NEGATIVE mg/dL   Urobilinogen, UA 0.2  0.0 - 1.0 mg/dL   Nitrite NEGATIVE  NEGATIVE   Leukocytes, UA NEGATIVE  NEGATIVE   Comment: MICROSCOPIC NOT DONE ON URINES WITH NEGATIVE PROTEIN, BLOOD, LEUKOCYTES, NITRITE, OR GLUCOSE <1000 mg/dL.  GLUCOSE, CAPILLARY     Status: Abnormal   Collection Time    03/18/13 10:16 PM      Result Value Range   Glucose-Capillary 154 (*) 70 - 99 mg/dL   Comment 1 Notify RN    BASIC METABOLIC PANEL     Status: Abnormal   Collection Time    03/19/13  4:05 AM      Result Value Range   Sodium 138  135 - 145 mEq/L   Potassium 3.1 (*) 3.5 - 5.1 mEq/L   Chloride 98  96 - 112 mEq/L   CO2 31  19 - 32 mEq/L   Glucose, Bld 106 (*) 70 - 99 mg/dL   BUN 35 (*) 6 - 23 mg/dL   Creatinine, Ser 9.60 (*) 0.50 - 1.35 mg/dL   Calcium 8.8  8.4 - 45.4 mg/dL   GFR calc non Af Amer 22 (*) >90 mL/min   GFR calc Af Amer 26 (*) >90 mL/min   Comment: (NOTE)     The eGFR has been calculated using the CKD EPI equation.     This calculation has not been validated in all clinical situations.     eGFR's persistently <90 mL/min signify possible Chronic Kidney     Disease.  MAGNESIUM     Status: Abnormal   Collection Time    03/19/13  4:05 AM      Result Value Range   Magnesium 1.2 (*) 1.5 - 2.5 mg/dL  GLUCOSE, CAPILLARY     Status: Abnormal   Collection Time    03/19/13  8:04 AM      Result Value Range   Glucose-Capillary 109 (*) 70 - 99 mg/dL   Comment 1 Documented in Chart     Comment 2 Notify RN    T4, FREE     Status: None   Collection Time    03/19/13  9:50 AM      Result Value Range   Free T4 1.45  0.80 - 1.80 ng/dL   Comment: Performed at [facility]  T3, FREE     Status: Abnormal   Collection Time    03/19/13  9:50 AM      Result Value Range   T3, Free 1.8 (*) 2.3 - 4.2 pg/mL   Comment: Performed at [facility]  GLUCOSE, CAPILLARY     Status: Abnormal   Collection Time    03/19/13  12:17 PM      Result Value Range   Glucose-Capillary 135 (*) 70 - 99 mg/dL   Comment 1 Notify RN     Comment 2 Documented in Chart    GLUCOSE, CAPILLARY     Status: Abnormal   Collection Time    03/19/13  5:48 PM      Result Value Range   Glucose-Capillary 166 (*) 70 - 99 mg/dL   Comment 1 Documented in Chart     Comment 2 Notify RN    GLUCOSE, CAPILLARY     Status: Abnormal   Collection Time    03/19/13  9:43 PM      Result Value Range   Glucose-Capillary 157 (*) 70 - 99 mg/dL  BASIC METABOLIC PANEL     Status: Abnormal   Collection Time    03/20/13  4:30 AM      Result Value Range   Sodium 136  135 - 145 mEq/L   Potassium 3.2 (*) 3.5 - 5.1 mEq/L   Chloride 97  96 - 112 mEq/L   CO2 31  19 - 32 mEq/L   Glucose, Bld 101 (*) 70 - 99 mg/dL   BUN 36 (*) 6 - 23 mg/dL   Creatinine, Ser 4.78 (*) 0.50 - 1.35 mg/dL   Calcium 8.5  8.4 - 29.5 mg/dL   GFR calc non Af Amer 22 (*) >90 mL/min   GFR calc Af Amer 26 (*) >90 mL/min   Comment: (NOTE)     The eGFR has been calculated using the CKD EPI equation.     This calculation has not been validated in all clinical situations.     eGFR's persistently <90 mL/min signify possible Chronic Kidney     Disease.  MAGNESIUM     Status: None   Collection Time    03/20/13  4:30 AM      Result Value Range   Magnesium 2.0  1.5 - 2.5 mg/dL  GLUCOSE, CAPILLARY     Status: None   Collection Time    03/20/13  8:20 AM      Result Value Range   Glucose-Capillary 95  70 - 99 mg/dL  GLUCOSE, CAPILLARY     Status: Abnormal   Collection Time    03/20/13 11:57 AM      Result Value Range  Glucose-Capillary 133 (*) 70 - 99 mg/dL  BASIC METABOLIC PANEL     Status: Abnormal   Collection Time    04/17/13 12:35 PM      Result Value Range   Sodium 143  135 - 145 mEq/L   Potassium 4.2  3.5 - 5.1 mEq/L   Chloride 107  96 - 112 mEq/L   CO2 27  19 - 32 mEq/L   Glucose, Bld 75  70 - 99 mg/dL   BUN 35 (*) 6 - 23 mg/dL   Creatinine, Ser 2.4 (*) 0.4 - 1.5  mg/dL   Calcium 9.4  8.4 - 16.1 mg/dL   GFR 09.60 (*) >45.40 mL/min    RADIOGRAPHIC STUDIES: I reviewed his last CT angiogram from November 2013 and did not see any abnormal splenic enlargement  ASSESSMENT:  Chronic anemia, thrombocytopenia, a background history of prostate cancer  PLAN:  #1 chronic anemia Previously the patient was found to have iron deficiency and has been placed on oral ion supplements for years. His most recent ferritin level is over 1300. I have discontinued his iron supplement. He was then found to have B12 deficiency recently and is on B12 injection for that. He will continue. He was found to have chronic kidney disease secondary to peripheral vascular disease. Certainly this could cause anemia. However, before I prescribed erythropoietin stimulating agents, I want to exclude malignancy. I recommend bone marrow aspirate and biopsy for further evaluation. He had prior history of radiation for prostate cancer. I want to make sure this is not due to myelodysplastic syndrome from radiation or involvement of bone marrow from prostate cancer. His Lupron injections sometimes can also cause chronic anemia but certainly not to this degree. I discussed with the patient the risks, benefits, and detailed procedure of a sedated bone marrow aspirate and biopsy. After a prolonged discussion, he is in agreement to proceed. I am rechecking his blood work today and holding of blood transfusion unless his hemoglobin dropped to less than 7 g or the patient becomes symptomatic #2 thrombocytopenia This is unexplained. Certainly borderline B12 deficiency can cause thrombocytopenia and is currently on replacement therapy. As mentioned above, I would like to evaluate this further with a bone marrow aspirate and biopsy. The patient is currently asymptomatic with no signs of bleeding. We will observe. He does not require platelet transfusion. There is no contraindication for him to proceed with current  antiplatelet agents as long as his platelet count remained over 50,000 #3 history of prostate cancer I will recheck his PSA today. He will continue his Lupron injection under the care of his urologist. In the bone marrow aspirate and biopsy, I will make sure to inform the pathologist to make sure there is no evidence of prostate cancer involvement #4 chronic kidney disease This is likely secondary to medication or peripheral vascular disease. If the bone marrow aspirate and biopsy rule out cancer involvement, the patient may be a candidate for erythropoietin stimulating agents. Orders Placed This Encounter  Procedures  . PSA    Standing Status: Future     Number of Occurrences:      Standing Expiration Date: 04/24/2014  . CBC & Diff and Retic    Standing Status: Future     Number of Occurrences:      Standing Expiration Date: 04/24/2014  . Haptoglobin    Standing Status: Future     Number of Occurrences:      Standing Expiration Date: 04/24/2014  . Erythropoietin  Standing Status: Future     Number of Occurrences:      Standing Expiration Date: 04/24/2014  . Morphology    Standing Status: Future     Number of Occurrences:      Standing Expiration Date: 04/24/2014  . Comprehensive metabolic panel    Standing Status: Future     Number of Occurrences:      Standing Expiration Date: 04/24/2014  . Hold Tube, Blood Bank    Standing Status: Future     Number of Occurrences:      Standing Expiration Date: 04/24/2014    All questions were answered. The patient knows to call the clinic with any problems, questions or concerns. I spent 40 minutes counseling the patient face to face. The total time spent in the appointment was 60 minutes and more than 50% was on counseling.     Sheletha Bow, MD 04/24/2013 10:43 AM

## 2013-04-24 NOTE — Telephone Encounter (Signed)
Pt states he is OK to get 1 unit RBC on Wednesday at 0800. Called for HAR

## 2013-04-24 NOTE — Telephone Encounter (Signed)
Wife notified of bone marrow biopsy scheduled for 05/10/13 @ 7:00 am at [facility] short stay

## 2013-04-25 ENCOUNTER — Encounter (INDEPENDENT_AMBULATORY_CARE_PROVIDER_SITE_OTHER): Payer: Self-pay

## 2013-04-25 ENCOUNTER — Other Ambulatory Visit (HOSPITAL_BASED_OUTPATIENT_CLINIC_OR_DEPARTMENT_OTHER): Payer: Medicare Other | Admitting: *Deleted

## 2013-04-25 ENCOUNTER — Ambulatory Visit: Payer: Medicare Other

## 2013-04-25 ENCOUNTER — Telehealth: Payer: Self-pay | Admitting: Hematology and Oncology

## 2013-04-25 DIAGNOSIS — D649 Anemia, unspecified: Secondary | ICD-10-CM

## 2013-04-25 LAB — HAPTOGLOBIN: Haptoglobin: <25 mg/dL — ABNORMAL LOW (ref 45–215)

## 2013-04-25 LAB — ERYTHROPOIETIN: Erythropoietin: 29.7 m[IU]/mL — ABNORMAL HIGH (ref 2.6–18.5)

## 2013-04-25 MED ORDER — SODIUM CHLORIDE 0.9 % IV SOLN
250.0000 mL | Freq: Once | INTRAVENOUS | Status: AC
  Administered 2013-04-25: 250 mL via INTRAVENOUS

## 2013-04-25 NOTE — Progress Notes (Signed)
0900 Blood started at 50 cc for 12.5 cc.  0915 VSS, Blood increased to 185 cc for 185 cc, no signs of reaction.  1015 Blood continues to infuse at 185 cc, vss, no signs of reaction. 1035 Blood transfusion complete, vss, no signs of reaction.  Total volume infused 325 cc.

## 2013-04-25 NOTE — Patient Instructions (Signed)
Blood Transfusion Information WHAT IS A BLOOD TRANSFUSION? A transfusion is the replacement of blood or some of its parts. Blood is made up of multiple cells which provide different functions.  Red blood cells carry oxygen and are used for blood loss replacement.  White blood cells fight against infection.  Platelets control bleeding.  Plasma helps clot blood.  Other blood products are available for specialized needs, such as hemophilia or other clotting disorders. BEFORE THE TRANSFUSION  Who gives blood for transfusions?   You may be able to donate blood to be used at a later date on yourself (autologous donation).  Relatives can be asked to donate blood. This is generally not any safer than if you have received blood from a stranger. The same precautions are taken to ensure safety when a relative's blood is donated.  Healthy volunteers who are fully evaluated to make sure their blood is safe. This is blood bank blood. Transfusion therapy is the safest it has ever been in the practice of medicine. Before blood is taken from a donor, a complete history is taken to make sure that person has no history of diseases nor engages in risky social behavior (examples are intravenous drug use or sexual activity with multiple partners). The donor's travel history is screened to minimize risk of transmitting infections, such as malaria. The donated blood is tested for signs of infectious diseases, such as HIV and hepatitis. The blood is then tested to be sure it is compatible with you in order to minimize the chance of a transfusion reaction. If you or a relative donates blood, this is often done in anticipation of surgery and is not appropriate for emergency situations. It takes many days to process the donated blood. RISKS AND COMPLICATIONS Although transfusion therapy is very safe and saves many lives, the main dangers of transfusion include:   Getting an infectious disease.  Developing a  transfusion reaction. This is an allergic reaction to something in the blood you were given. Every precaution is taken to prevent this. The decision to have a blood transfusion has been considered carefully by your caregiver before blood is given. Blood is not given unless the benefits outweigh the risks. AFTER THE TRANSFUSION  Right after receiving a blood transfusion, you will usually feel much better and more energetic. This is especially true if your red blood cells have gotten low (anemic). The transfusion raises the level of the red blood cells which carry oxygen, and this usually causes an energy increase.  The nurse administering the transfusion will monitor you carefully for complications. HOME CARE INSTRUCTIONS  No special instructions are needed after a transfusion. You may find your energy is better. Speak with your caregiver about any limitations on activity for underlying diseases you may have. SEEK MEDICAL CARE IF:   Your condition is not improving after your transfusion.  You develop redness or irritation at the intravenous (IV) site. SEEK IMMEDIATE MEDICAL CARE IF:  Any of the following symptoms occur over the next 12 hours:  Shaking chills.  You have a temperature by mouth above 102 F (38.9 C), not controlled by medicine.  Chest, back, or muscle pain.  People around you feel you are not acting correctly or are confused.  Shortness of breath or difficulty breathing.  Dizziness and fainting.  You get a rash or develop hives.  You have a decrease in urine output.  Your urine turns a dark color or changes to pink, red, or brown. Any of the following   symptoms occur over the next 10 days:  You have a temperature by mouth above 102 F (38.9 C), not controlled by medicine.  Shortness of breath.  Weakness after normal activity.  The white part of the eye turns yellow (jaundice).  You have a decrease in the amount of urine or are urinating less often.  Your  urine turns a dark color or changes to pink, red, or brown. Document Released: 06/25/2000 Document Revised: 09/20/2011 Document Reviewed: 02/12/2008 ExitCare Patient Information 2014 [employer].  

## 2013-04-25 NOTE — Telephone Encounter (Signed)
s.w. pt and advised on 11.11.14 appt...pt ok and aware

## 2013-04-26 LAB — TYPE AND SCREEN
ABO/RH(D): O POS
Antibody Screen: NEGATIVE
Unit division: 0

## 2013-04-27 ENCOUNTER — Other Ambulatory Visit: Payer: Self-pay | Admitting: *Deleted

## 2013-05-08 ENCOUNTER — Encounter (HOSPITAL_COMMUNITY): Payer: Self-pay | Admitting: Pharmacy Technician

## 2013-05-08 ENCOUNTER — Other Ambulatory Visit: Payer: Self-pay

## 2013-05-08 MED ORDER — AMIODARONE HCL 200 MG PO TABS: 100.0000 mg | ORAL_TABLET | Freq: Every day | ORAL | Status: DC

## 2013-05-10 ENCOUNTER — Ambulatory Visit (HOSPITAL_COMMUNITY)
Admission: RE | Admit: 2013-05-10 | Discharge: 2013-05-10 | Disposition: A | Discharge status: Home / Self Care | Payer: Medicare Other | Source: Ambulatory Visit | Attending: Hematology and Oncology | Admitting: Hematology and Oncology

## 2013-05-10 ENCOUNTER — Encounter (HOSPITAL_COMMUNITY): Payer: Self-pay

## 2013-05-10 VITALS — BP 202/80 | HR 63 | Temp 97.8°F | Resp 15

## 2013-05-10 DIAGNOSIS — D696 Thrombocytopenia, unspecified: Secondary | ICD-10-CM | POA: Insufficient documentation

## 2013-05-10 DIAGNOSIS — D649 Anemia, unspecified: Secondary | ICD-10-CM

## 2013-05-10 LAB — CBC WITH DIFFERENTIAL/PLATELET
Basophils Relative: 1 % (ref 0–1)
Eosinophils Relative: 4 % (ref 0–5)
Hemoglobin: 7.5 g/dL — ABNORMAL LOW (ref 13.0–17.0)
Lymphocytes Relative: 17 % (ref 12–46)
Lymphs Abs: 1.1 10*3/uL (ref 0.7–4.0)
MCH: 32.2 pg (ref 26.0–34.0)
MCV: 98.7 fL (ref 78.0–100.0)
Monocytes Absolute: 0.7 10*3/uL (ref 0.1–1.0)
Monocytes Relative: 11 % (ref 3–12)
Neutrophils Relative %: 67 % (ref 43–77)
Platelets: 136 10*3/uL — ABNORMAL LOW (ref 150–400)
RBC: 2.33 MIL/uL — ABNORMAL LOW (ref 4.22–5.81)
RDW: 16.5 % — ABNORMAL HIGH (ref 11.5–15.5)
WBC: 6.6 10*3/uL (ref 4.0–10.5)

## 2013-05-10 LAB — PREPARE RBC (CROSSMATCH)

## 2013-05-10 LAB — GLUCOSE, CAPILLARY: Glucose-Capillary: 88 mg/dL (ref 70–99)

## 2013-05-10 MED ORDER — MIDAZOLAM HCL 5 MG/5ML IJ SOLN
INTRAMUSCULAR | Status: AC | PRN
  Administered 2013-05-10 (×2): 1 mg via INTRAVENOUS
  Administered 2013-05-10: 2 mg via INTRAVENOUS

## 2013-05-10 MED ORDER — MIDAZOLAM HCL 10 MG/2ML IJ SOLN
10.0000 mg | Freq: Once | INTRAMUSCULAR | Status: DC
  Filled 2013-05-10: qty 2

## 2013-05-10 MED ORDER — HEPARIN SOD (PORK) LOCK FLUSH 100 UNIT/ML IV SOLN: 500.0000 [IU] | Freq: Every day | INTRAVENOUS | Status: DC | PRN

## 2013-05-10 MED ORDER — SODIUM CHLORIDE 0.9 % IV SOLN
INTRAVENOUS | Status: DC
  Administered 2013-05-10: 08:00:00 via INTRAVENOUS

## 2013-05-10 MED ORDER — SODIUM CHLORIDE 0.9 % IV SOLN: 250.0000 mL | Freq: Once | INTRAVENOUS | Status: DC

## 2013-05-10 MED ORDER — SODIUM CHLORIDE 0.9 % IJ SOLN: 3.0000 mL | INTRAMUSCULAR | Status: DC | PRN

## 2013-05-10 MED ORDER — HEPARIN SOD (PORK) LOCK FLUSH 100 UNIT/ML IV SOLN: 250.0000 [IU] | INTRAVENOUS | Status: DC | PRN

## 2013-05-10 MED ORDER — FUROSEMIDE 10 MG/ML IJ SOLN
20.0000 mg | Freq: Once | INTRAMUSCULAR | Status: AC
  Administered 2013-05-10: 20 mg via INTRAVENOUS
  Filled 2013-05-10: qty 2

## 2013-05-10 MED ORDER — SODIUM CHLORIDE 0.9 % IJ SOLN: 10.0000 mL | INTRAMUSCULAR | Status: DC | PRN

## 2013-05-10 NOTE — ED Notes (Signed)
MD at bedside. 

## 2013-05-10 NOTE — Procedures (Signed)
Brief examination was performed. ENT: adequate airway clearance Heart: regular rate and rhythm.No Murmurs Lungs: clear to auscultation, no wheezes, normal respiratory effort  Bone Marrow Biopsy and Aspiration Procedure Note   Informed consent was obtained and potential risks including bleeding, infection and pain were reviewed with the patient. I verified that the patient has been fasting since midnight.  The patient's name, date of birth, identification, consent and allergies were verified prior to the start of procedure and time out was performed.  A total of 4 mg of Versed was given.  The left posterior iliac crest was chosen as the site of biopsy.  The skin was prepped with Betadine solution.   4 cc of 2% lidocaine was used to provide local anaesthesia.   10 cc of bone marrow aspirate was obtained followed by 1 inch biopsy.   The procedure was tolerated well and there were no complications.  The patient was stable at the end of the procedure.  Specimens sent for flow cytometry, cytogenetics and additional studies.  Addendum: hemoglobin came back 7.5g. I recommended 2 units of blood transfusion, patient agreed to proceeed

## 2013-05-10 NOTE — ED Notes (Signed)
Family updated as to patient's status.

## 2013-05-10 NOTE — ED Notes (Signed)
Procedure completed . Pressure dressing applied to  bx site

## 2013-05-11 LAB — TYPE AND SCREEN
ABO/RH(D): O POS
Unit division: 0

## 2013-05-21 LAB — CHROMOSOME ANALYSIS, BONE MARROW

## 2013-05-21 LAB — TISSUE HYBRIDIZATION (BONE MARROW)-NCBH

## 2013-05-22 ENCOUNTER — Encounter (INDEPENDENT_AMBULATORY_CARE_PROVIDER_SITE_OTHER): Payer: Self-pay

## 2013-05-22 ENCOUNTER — Encounter: Payer: Self-pay | Admitting: Hematology and Oncology

## 2013-05-22 ENCOUNTER — Ambulatory Visit (HOSPITAL_BASED_OUTPATIENT_CLINIC_OR_DEPARTMENT_OTHER): Payer: Medicare Other | Admitting: Hematology and Oncology

## 2013-05-22 VITALS — BP 193/70 | HR 61 | Temp 98.4°F | Resp 18 | Ht 70.0 in | Wt 182.7 lb

## 2013-05-22 DIAGNOSIS — D649 Anemia, unspecified: Secondary | ICD-10-CM

## 2013-05-22 DIAGNOSIS — D469 Myelodysplastic syndrome, unspecified: Secondary | ICD-10-CM | POA: Insufficient documentation

## 2013-05-22 DIAGNOSIS — C61 Malignant neoplasm of prostate: Secondary | ICD-10-CM

## 2013-05-22 DIAGNOSIS — N184 Chronic kidney disease, stage 4 (severe): Secondary | ICD-10-CM

## 2013-05-22 DIAGNOSIS — D696 Thrombocytopenia, unspecified: Secondary | ICD-10-CM

## 2013-05-22 HISTORY — DX: Myelodysplastic syndrome, unspecified: D46.9

## 2013-05-22 NOTE — Patient Instructions (Signed)
Epoetin Alfa injection What is this medicine? EPOETIN ALFA (e POE e tin AL fa) helps your body make more red blood cells. This medicine is used to treat anemia caused by chronic kidney failure, cancer chemotherapy, or HIV-therapy. It may also be used before surgery if you have anemia. This medicine may be used for other purposes; ask your health care provider or pharmacist if you have questions. COMMON BRAND NAME(S): Epogen, Procrit What should I tell my health care provider before I take this medicine? They need to know if you have any of these conditions: -blood clotting disorders -cancer patient not on chemotherapy -cystic fibrosis -heart disease, such as angina or heart failure -hemoglobin level of 12 g/dL or greater -high blood pressure -low levels of folate, iron, or vitamin B12 -seizures -an unusual or allergic reaction to erythropoietin, albumin, benzyl alcohol, hamster proteins, other medicines, foods, dyes, or preservatives -pregnant or trying to get pregnant -breast-feeding How should I use this medicine? This medicine is for injection into a vein or under the skin. It is usually given by a health care professional in a hospital or clinic setting. If you get this medicine at home, you will be taught how to prepare and give this medicine. Use exactly as directed. Take your medicine at regular intervals. Do not take your medicine more often than directed. It is important that you put your used needles and syringes in a special sharps container. Do not put them in a trash can. If you do not have a sharps container, call your pharmacist or healthcare provider to get one. Talk to your pediatrician regarding the use of this medicine in children. While this drug may be prescribed for selected conditions, precautions do apply. Overdosage: If you think you have taken too much of this medicine contact a poison control center or emergency room at once. NOTE: This medicine is only for you. Do  not share this medicine with others. What if I miss a dose? If you miss a dose, take it as soon as you can. If it is almost time for your next dose, take only that dose. Do not take double or extra doses. What may interact with this medicine? Do not take this medicine with any of the following medications: -darbepoetin alfa This list may not describe all possible interactions. Give your health care provider a list of all the medicines, herbs, non-prescription drugs, or dietary supplements you use. Also tell them if you smoke, drink alcohol, or use illegal drugs. Some items may interact with your medicine. What should I watch for while using this medicine? Visit your prescriber or health care professional for regular checks on your progress and for the needed blood tests and blood pressure measurements. It is especially important for the doctor to make sure your hemoglobin level is in the desired range, to limit the risk of potential side effects and to give you the best benefit. Keep all appointments for any recommended tests. Check your blood pressure as directed. Ask your doctor what your blood pressure should be and when you should contact him or her. As your body makes more red blood cells, you may need to take iron, folic acid, or vitamin B supplements. Ask your doctor or health care provider which products are right for you. If you have kidney disease continue dietary restrictions, even though this medication can make you feel better. Talk with your doctor or health care professional about the foods you eat and the vitamins that you take. What   side effects may I notice from receiving this medicine? Side effects that you should report to your doctor or health care professional as soon as possible: -allergic reactions like skin rash, itching or hives, swelling of the face, lips, or tongue -breathing problems -changes in vision -chest pain -confusion, trouble speaking or understanding -feeling  faint or lightheaded, falls -high blood pressure -muscle aches or pains -pain, swelling, warmth in the leg -rapid weight gain -severe headaches -sudden numbness or weakness of the face, arm or leg -trouble walking, dizziness, loss of balance or coordination -seizures (convulsions) -swelling of the ankles, feet, hands -unusually weak or tired Side effects that usually do not require medical attention (report to your doctor or health care professional if they continue or are bothersome): -diarrhea -fever, chills (flu-like symptoms) -headaches -nausea, vomiting -redness, stinging, or swelling at site where injected This list may not describe all possible side effects. Call your doctor for medical advice about side effects. You may report side effects to FDA at 1-800-FDA-1088. Where should I keep my medicine? Keep out of the reach of children. Store in a refrigerator between 2 and 8 degrees C (36 and 46 degrees F). Do not freeze or shake. Throw away any unused portion if using a single-dose vial. Multi-dose vials can be kept in the refrigerator for up to 21 days after the initial dose. Throw away unused medicine. NOTE: This sheet is a summary. It may not cover all possible information. If you have questions about this medicine, talk to your doctor, pharmacist, or health care provider.  2014, Elsevier/Gold Standard. (2008-06-11 10:25:44)  

## 2013-05-22 NOTE — Progress Notes (Signed)
[facility] OFFICE PROGRESS NOTE  Patient Care Team: Catha Gosselin, MD as PCP - General (Family Medicine) Lesleigh Noe, MD (Cardiology) Vertell Novak., MD (Gastroenterology) Lindaann Slough, MD (Urology)  DIAGNOSIS: Chronic anemia, mild thrombocytopenia, consistent with myelodysplastic syndrome  SUMMARY OF ONCOLOGIC HISTORY: This is a very pleasant [name] accompanied by his wife today. I have reviewed his records extensively and collaborated the history with the patient. This is a very interesting [name] with a history of prostate cancer diagnosed in 1997, status post radiation and seed implantation and Lupron injection. The patient cannot recall the stage of his disease. He's been getting Lupron injection every 4 months, the next one will be due and if this month. He does not recall his last PSA number but was never told he require urgent attention of further treatment apart from just getting Lupron injection. He was also found to have chronic anemia in the past and has been placed on oral ion supplements 4 years of which he took twice a day. His last ferritin level was over 1300. The patient also has extensive EGD and colonoscopy done which showed no evidence of active bleeding. He had B12 level checked recently and was found to have B12 deficiency and was started on B12 injections. The patient also has significant anemia requiring hospitalization and recent blood transfusion. On 05/10/2013 he had bone marrow aspirate and biopsy INTERVAL HISTORY: Dennie Fetters Sr. 77 y.o. male returns for further followup. He felt better after blood transfusion. The patient denies any recent signs or symptoms of bleeding such as spontaneous epistaxis, hematuria or hematochezia.  I have reviewed the past medical history, past surgical history, social history and family history with the patient and they are unchanged from previous note.  ALLERGIES:  is allergic to ace  inhibitors and ibuprofen.  MEDICATIONS:  Current Outpatient Prescriptions  Medication Sig Dispense Refill  . acetaminophen (ARTHRITIS PAIN RELIEF) 650 MG CR tablet Take 1,300 mg by mouth 2 (two) times daily.       Marland Kitchen amiodarone (PACERONE) 200 MG tablet Take 100 mg by mouth daily.      Marland Kitchen amLODipine (NORVASC) 5 MG tablet Take 5 mg by mouth daily.       Marland Kitchen aspirin 325 MG tablet Take 325 mg by mouth daily.      Marland Kitchen atorvastatin (LIPITOR) 80 MG tablet Take 80 mg by mouth daily.      Marland Kitchen CALCIUM PO Take 2,000 mg by mouth daily.       . cholecalciferol (VITAMIN D) 1000 UNITS tablet Take 1,000 Units by mouth daily.       . cloNIDine (CATAPRES) 0.2 MG tablet Take 0.2 mg by mouth 2 (two) times daily.       . clopidogrel (PLAVIX) 75 MG tablet Take 75 mg by mouth daily.      . Cyanocobalamin (VITAMIN B-12 IJ) Inject as directed.      . furosemide (LASIX) 80 MG tablet Take 80 mg by mouth daily.       Marland Kitchen glucose blood test strip       . Leuprolide Acetate (LUPRON DEPOT IM) Inject into the muscle See admin instructions. Every 4 months as needed      . metoprolol tartrate (LOPRESSOR) 25 MG tablet Take 12.5 mg by mouth 2 (two) times daily.       . pantoprazole (PROTONIX) 40 MG tablet Take 40 mg by mouth daily.      . potassium chloride  SA (K-DUR,KLOR-CON) 20 MEQ tablet Take 20-40 mEq by mouth 2 (two) times daily. 20 meq in the evening and 40 meq in the a.m.      Marland Kitchen silodosin (RAPAFLO) 8 MG CAPS capsule Take 8 mg by mouth daily.        No current facility-administered medications for this visit.    REVIEW OF SYSTEMS:   All other systems were reviewed with the patient and are negative.  PHYSICAL EXAMINATION: ECOG PERFORMANCE STATUS: 1 - Symptomatic but completely ambulatory  Filed Vitals:   05/22/13 1425  BP: 193/70  Pulse: 61  Temp: 98.4 F (36.9 C)  Resp: 18   Filed Weights   05/22/13 1425  Weight: 182 lb 11.2 oz (82.872 kg)    GENERAL:alert, no distress and comfortable NEURO: alert & oriented x  3 with fluent speech, no focal motor/sensory deficits  LABORATORY DATA:  I have reviewed the data as listed    Component Value Date/Time   NA 147* 04/24/2013 1211   NA 143 04/17/2013 1235   K 3.1* 04/24/2013 1211   K 4.2 04/17/2013 1235   CL 107 04/17/2013 1235   CO2 27 04/24/2013 1211   CO2 27 04/17/2013 1235   GLUCOSE 95 04/24/2013 1211   GLUCOSE 75 04/17/2013 1235   BUN 26.1* 04/24/2013 1211   BUN 35* 04/17/2013 1235   CREATININE 2.2* 04/24/2013 1211   CREATININE 2.4* 04/17/2013 1235   CALCIUM 9.2 04/24/2013 1211   CALCIUM 9.4 04/17/2013 1235   PROT 7.1 04/24/2013 1211   PROT 6.0 12/25/2011 0416   ALBUMIN 3.5 04/24/2013 1211   ALBUMIN 3.2* 12/25/2011 0416   AST 16 04/24/2013 1211   AST 16 12/25/2011 0416   ALT 8 04/24/2013 1211   ALT 7 12/25/2011 0416   ALKPHOS 39* 04/24/2013 1211   ALKPHOS 51 12/25/2011 0416   BILITOT 0.32 04/24/2013 1211   BILITOT 0.2* 12/25/2011 0416   GFRNONAA 22* 03/20/2013 0430   GFRAA 26* 03/20/2013 0430    No results found for this basename: SPEP,  UPEP,   kappa and lambda light chains    Lab Results  Component Value Date   WBC 6.6 05/10/2013   NEUTROABS 4.4 05/10/2013   HGB 7.5* 05/10/2013   HCT 23.0* 05/10/2013   MCV 98.7 05/10/2013   PLT 136* 05/10/2013      Chemistry      Component Value Date/Time   NA 147* 04/24/2013 1211   NA 143 04/17/2013 1235   K 3.1* 04/24/2013 1211   K 4.2 04/17/2013 1235   CL 107 04/17/2013 1235   CO2 27 04/24/2013 1211   CO2 27 04/17/2013 1235   BUN 26.1* 04/24/2013 1211   BUN 35* 04/17/2013 1235   CREATININE 2.2* 04/24/2013 1211   CREATININE 2.4* 04/17/2013 1235      Component Value Date/Time   CALCIUM 9.2 04/24/2013 1211   CALCIUM 9.4 04/17/2013 1235   ALKPHOS 39* 04/24/2013 1211   ALKPHOS 51 12/25/2011 0416   AST 16 04/24/2013 1211   AST 16 12/25/2011 0416   ALT 8 04/24/2013 1211   ALT 7 12/25/2011 0416   BILITOT 0.32 04/24/2013 1211   BILITOT 0.2* 12/25/2011 0416    ASSESSMENT & PLAN:  Mild pancytopenia,  most consistent with myelodysplastic syndrome #1 myelodysplastic syndrome His most recent bone marrow aspirate and biopsy is consistent with myelodysplastic syndrome. His calculated IPS test score is 0. Prognosis is still reasonably well. The patient is very anemic. I suspect a Lupron injection might play  a role. I recommend erythropoietin stimulating agents. I discussed with the patient the risk and benefit of erythropoietin stimulating agents including potential risk of cancer growth, leukemia, blood clots, headache and hypertension. The patient is in agreement to proceed. He went ahead and sign consent. I will schedule him to stop Procrit weekly and titrate the dose as needed. I will see him several weeks from now. The patient is aware if his hemoglobin drop to less than 7 g he will continue to receive blood transfusion #2 from the cytopenia This is due to his MDS. He will continue B12 injection. The patient can continue on current antiplatelet agents as long as his platelet count remained over 50,000 #3 history of prostate cancer PSA is at 3. There is no evidence of prostate cancer in his bone marrow. He'll continue on Lupron injection under the direction of his urologist #4 chronic kidney disease He will continue followup with his nephrologist. No orders of the defined types were placed in this encounter.   All questions were answered. The patient knows to call the clinic with any problems, questions or concerns. No barriers to learning was detected. I spent 40 minutes counseling the patient face to face. The total time spent in the appointment was 60 minutes and more than 50% was on counseling and review of test results     [facility], Jerianne Anselmo, MD 05/22/2013 3:49 PM

## 2013-05-23 ENCOUNTER — Ambulatory Visit: Payer: Medicare Other

## 2013-05-23 ENCOUNTER — Other Ambulatory Visit: Payer: Medicare Other | Admitting: Lab

## 2013-05-23 ENCOUNTER — Telehealth: Payer: Self-pay | Admitting: Hematology and Oncology

## 2013-05-23 NOTE — Telephone Encounter (Signed)
worked 11/11 POF called pt sw wife who requested I mail calendar which I have done shh

## 2013-05-25 ENCOUNTER — Other Ambulatory Visit (HOSPITAL_BASED_OUTPATIENT_CLINIC_OR_DEPARTMENT_OTHER): Payer: Medicare Other | Admitting: Lab

## 2013-05-25 ENCOUNTER — Ambulatory Visit (HOSPITAL_BASED_OUTPATIENT_CLINIC_OR_DEPARTMENT_OTHER): Payer: Medicare Other

## 2013-05-25 VITALS — BP 180/81 | HR 59 | Temp 96.6°F | Resp 18

## 2013-05-25 DIAGNOSIS — D631 Anemia in chronic kidney disease: Secondary | ICD-10-CM

## 2013-05-25 DIAGNOSIS — N189 Chronic kidney disease, unspecified: Secondary | ICD-10-CM

## 2013-05-25 DIAGNOSIS — D649 Anemia, unspecified: Secondary | ICD-10-CM

## 2013-05-25 DIAGNOSIS — N184 Chronic kidney disease, stage 4 (severe): Secondary | ICD-10-CM

## 2013-05-25 DIAGNOSIS — D469 Myelodysplastic syndrome, unspecified: Secondary | ICD-10-CM

## 2013-05-25 DIAGNOSIS — D696 Thrombocytopenia, unspecified: Secondary | ICD-10-CM

## 2013-05-25 LAB — CBC WITH DIFFERENTIAL/PLATELET
BASO%: 1.2 % (ref 0.0–2.0)
EOS%: 6.2 % (ref 0.0–7.0)
LYMPH%: 16.6 % (ref 14.0–49.0)
MCH: 31.4 pg (ref 27.2–33.4)
MCHC: 32.7 g/dL (ref 32.0–36.0)
MCV: 96.1 fL (ref 79.3–98.0)
MONO#: 0.8 10*3/uL (ref 0.1–0.9)
MONO%: 11.4 % (ref 0.0–14.0)
NEUT#: 4.3 10*3/uL (ref 1.5–6.5)
Platelets: 105 10*3/uL — ABNORMAL LOW (ref 140–400)
RBC: 2.88 10*6/uL — ABNORMAL LOW (ref 4.20–5.82)
RDW: 18.7 % — ABNORMAL HIGH (ref 11.0–14.6)
WBC: 6.7 10*3/uL (ref 4.0–10.3)

## 2013-05-25 MED ORDER — EPOETIN ALFA 20000 UNIT/ML IJ SOLN
20000.0000 [IU] | INTRAMUSCULAR | Status: DC
  Administered 2013-05-25: 20000 [IU] via SUBCUTANEOUS
  Filled 2013-05-25: qty 1

## 2013-05-25 NOTE — Progress Notes (Signed)
Pt here for first time Procrit 20,000 units SQ injection.  Pt had signed consent for epoetin at office visit on 05/22/13.  A copy of consent is kept in Dr. Maxine Glenn office.  BP taken in Left Arm   173/85  P   60  ;   BP taken in Right Arm    180/81   P  59. Dr. Bertis Ruddy notified.  Per md , pt to take an extra Norvasc 5mg  tonight.  Starting 05/26/13,  Pt to take Norvasc 5mg  po Twice a day along with Metoprolol  BID and Clonidine  BID.  Proceed with Procrit injection today as per md. Blood pressure readings written down along with instructions for Norvasc and gave to pt.  Verbal instructions also given to pt and wife to contact pt's primary to notify of increased blood pressure despite taking medications. AVS printed and gave to pt.  Both pt and wife voiced understanding.

## 2013-05-25 NOTE — Patient Instructions (Signed)
Epoetin Alfa injection What is this medicine? EPOETIN ALFA (e POE e tin AL fa) helps your body make more red blood cells. This medicine is used to treat anemia caused by chronic kidney failure, cancer chemotherapy, or HIV-therapy. It may also be used before surgery if you have anemia. This medicine may be used for other purposes; ask your health care provider or pharmacist if you have questions. COMMON BRAND NAME(S): Epogen, Procrit What should I tell my health care provider before I take this medicine? They need to know if you have any of these conditions: -blood clotting disorders -cancer patient not on chemotherapy -cystic fibrosis -heart disease, such as angina or heart failure -hemoglobin level of 12 g/dL or greater -high blood pressure -low levels of folate, iron, or vitamin B12 -seizures -an unusual or allergic reaction to erythropoietin, albumin, benzyl alcohol, hamster proteins, other medicines, foods, dyes, or preservatives -pregnant or trying to get pregnant -breast-feeding How should I use this medicine? This medicine is for injection into a vein or under the skin. It is usually given by a health care professional in a hospital or clinic setting. If you get this medicine at home, you will be taught how to prepare and give this medicine. Use exactly as directed. Take your medicine at regular intervals. Do not take your medicine more often than directed. It is important that you put your used needles and syringes in a special sharps container. Do not put them in a trash can. If you do not have a sharps container, call your pharmacist or healthcare provider to get one. Talk to your pediatrician regarding the use of this medicine in children. While this drug may be prescribed for selected conditions, precautions do apply. Overdosage: If you think you have taken too much of this medicine contact a poison control center or emergency room at once. NOTE: This medicine is only for you. Do  not share this medicine with others. What if I miss a dose? If you miss a dose, take it as soon as you can. If it is almost time for your next dose, take only that dose. Do not take double or extra doses. What may interact with this medicine? Do not take this medicine with any of the following medications: -darbepoetin alfa This list may not describe all possible interactions. Give your health care provider a list of all the medicines, herbs, non-prescription drugs, or dietary supplements you use. Also tell them if you smoke, drink alcohol, or use illegal drugs. Some items may interact with your medicine. What should I watch for while using this medicine? Visit your prescriber or health care professional for regular checks on your progress and for the needed blood tests and blood pressure measurements. It is especially important for the doctor to make sure your hemoglobin level is in the desired range, to limit the risk of potential side effects and to give you the best benefit. Keep all appointments for any recommended tests. Check your blood pressure as directed. Ask your doctor what your blood pressure should be and when you should contact him or her. As your body makes more red blood cells, you may need to take iron, folic acid, or vitamin B supplements. Ask your doctor or health care provider which products are right for you. If you have kidney disease continue dietary restrictions, even though this medication can make you feel better. Talk with your doctor or health care professional about the foods you eat and the vitamins that you take. What   side effects may I notice from receiving this medicine? Side effects that you should report to your doctor or health care professional as soon as possible: -allergic reactions like skin rash, itching or hives, swelling of the face, lips, or tongue -breathing problems -changes in vision -chest pain -confusion, trouble speaking or understanding -feeling  faint or lightheaded, falls -high blood pressure -muscle aches or pains -pain, swelling, warmth in the leg -rapid weight gain -severe headaches -sudden numbness or weakness of the face, arm or leg -trouble walking, dizziness, loss of balance or coordination -seizures (convulsions) -swelling of the ankles, feet, hands -unusually weak or tired Side effects that usually do not require medical attention (report to your doctor or health care professional if they continue or are bothersome): -diarrhea -fever, chills (flu-like symptoms) -headaches -nausea, vomiting -redness, stinging, or swelling at site where injected This list may not describe all possible side effects. Call your doctor for medical advice about side effects. You may report side effects to FDA at 1-800-FDA-1088. Where should I keep my medicine? Keep out of the reach of children. Store in a refrigerator between 2 and 8 degrees C (36 and 46 degrees F). Do not freeze or shake. Throw away any unused portion if using a single-dose vial. Multi-dose vials can be kept in the refrigerator for up to 21 days after the initial dose. Throw away unused medicine. NOTE: This sheet is a summary. It may not cover all possible information. If you have questions about this medicine, talk to your doctor, pharmacist, or health care provider.  2014, Elsevier/Gold Standard. (2008-06-11 10:25:44)  

## 2013-05-28 ENCOUNTER — Other Ambulatory Visit (HOSPITAL_COMMUNITY): Payer: Self-pay | Admitting: Thoracic Diseases

## 2013-05-30 ENCOUNTER — Other Ambulatory Visit (HOSPITAL_COMMUNITY): Payer: Self-pay | Admitting: Thoracic Diseases

## 2013-06-01 ENCOUNTER — Telehealth: Payer: Self-pay | Admitting: Hematology and Oncology

## 2013-06-01 ENCOUNTER — Other Ambulatory Visit (HOSPITAL_BASED_OUTPATIENT_CLINIC_OR_DEPARTMENT_OTHER): Payer: Medicare Other

## 2013-06-01 ENCOUNTER — Ambulatory Visit (HOSPITAL_BASED_OUTPATIENT_CLINIC_OR_DEPARTMENT_OTHER): Payer: Medicare Other

## 2013-06-01 VITALS — BP 181/87 | HR 59 | Temp 97.4°F

## 2013-06-01 DIAGNOSIS — D649 Anemia, unspecified: Secondary | ICD-10-CM

## 2013-06-01 DIAGNOSIS — D696 Thrombocytopenia, unspecified: Secondary | ICD-10-CM

## 2013-06-01 DIAGNOSIS — N184 Chronic kidney disease, stage 4 (severe): Secondary | ICD-10-CM

## 2013-06-01 DIAGNOSIS — D469 Myelodysplastic syndrome, unspecified: Secondary | ICD-10-CM

## 2013-06-01 LAB — CBC WITH DIFFERENTIAL/PLATELET
BASO%: 0.6 % (ref 0.0–2.0)
EOS%: 5.9 % (ref 0.0–7.0)
HCT: 29 % — ABNORMAL LOW (ref 38.4–49.9)
MCH: 31.4 pg (ref 27.2–33.4)
MCHC: 32.1 g/dL (ref 32.0–36.0)
MONO#: 0.7 10*3/uL (ref 0.1–0.9)
NEUT#: 4.6 10*3/uL (ref 1.5–6.5)
RBC: 2.96 10*6/uL — ABNORMAL LOW (ref 4.20–5.82)
WBC: 7.1 10*3/uL (ref 4.0–10.3)
lymph#: 1.4 10*3/uL (ref 0.9–3.3)

## 2013-06-01 LAB — HOLD TUBE, BLOOD BANK

## 2013-06-01 MED ORDER — EPOETIN ALFA 20000 UNIT/ML IJ SOLN
20000.0000 [IU] | INTRAMUSCULAR | Status: DC
  Administered 2013-06-01: 20000 [IU] via SUBCUTANEOUS
  Filled 2013-06-01: qty 1

## 2013-06-01 NOTE — Patient Instructions (Signed)
Epoetin Alfa injection What is this medicine? EPOETIN ALFA (e POE e tin AL fa) helps your body make more red blood cells. This medicine is used to treat anemia caused by chronic kidney failure, cancer chemotherapy, or HIV-therapy. It may also be used before surgery if you have anemia. This medicine may be used for other purposes; ask your health care provider or pharmacist if you have questions. COMMON BRAND NAME(S): Epogen, Procrit What should I tell my health care provider before I take this medicine? They need to know if you have any of these conditions: -blood clotting disorders -cancer patient not on chemotherapy -cystic fibrosis -heart disease, such as angina or heart failure -hemoglobin level of 12 g/dL or greater -high blood pressure -low levels of folate, iron, or vitamin B12 -seizures -an unusual or allergic reaction to erythropoietin, albumin, benzyl alcohol, hamster proteins, other medicines, foods, dyes, or preservatives -pregnant or trying to get pregnant -breast-feeding How should I use this medicine? This medicine is for injection into a vein or under the skin. It is usually given by a health care professional in a hospital or clinic setting. If you get this medicine at home, you will be taught how to prepare and give this medicine. Use exactly as directed. Take your medicine at regular intervals. Do not take your medicine more often than directed. It is important that you put your used needles and syringes in a special sharps container. Do not put them in a trash can. If you do not have a sharps container, call your pharmacist or healthcare provider to get one. Talk to your pediatrician regarding the use of this medicine in children. While this drug may be prescribed for selected conditions, precautions do apply. Overdosage: If you think you have taken too much of this medicine contact a poison control center or emergency room at once. NOTE: This medicine is only for you. Do  not share this medicine with others. What if I miss a dose? If you miss a dose, take it as soon as you can. If it is almost time for your next dose, take only that dose. Do not take double or extra doses. What may interact with this medicine? Do not take this medicine with any of the following medications: -darbepoetin alfa This list may not describe all possible interactions. Give your health care provider a list of all the medicines, herbs, non-prescription drugs, or dietary supplements you use. Also tell them if you smoke, drink alcohol, or use illegal drugs. Some items may interact with your medicine. What should I watch for while using this medicine? Visit your prescriber or health care professional for regular checks on your progress and for the needed blood tests and blood pressure measurements. It is especially important for the doctor to make sure your hemoglobin level is in the desired range, to limit the risk of potential side effects and to give you the best benefit. Keep all appointments for any recommended tests. Check your blood pressure as directed. Ask your doctor what your blood pressure should be and when you should contact him or her. As your body makes more red blood cells, you may need to take iron, folic acid, or vitamin B supplements. Ask your doctor or health care provider which products are right for you. If you have kidney disease continue dietary restrictions, even though this medication can make you feel better. Talk with your doctor or health care professional about the foods you eat and the vitamins that you take. What   side effects may I notice from receiving this medicine? Side effects that you should report to your doctor or health care professional as soon as possible: -allergic reactions like skin rash, itching or hives, swelling of the face, lips, or tongue -breathing problems -changes in vision -chest pain -confusion, trouble speaking or understanding -feeling  faint or lightheaded, falls -high blood pressure -muscle aches or pains -pain, swelling, warmth in the leg -rapid weight gain -severe headaches -sudden numbness or weakness of the face, arm or leg -trouble walking, dizziness, loss of balance or coordination -seizures (convulsions) -swelling of the ankles, feet, hands -unusually weak or tired Side effects that usually do not require medical attention (report to your doctor or health care professional if they continue or are bothersome): -diarrhea -fever, chills (flu-like symptoms) -headaches -nausea, vomiting -redness, stinging, or swelling at site where injected This list may not describe all possible side effects. Call your doctor for medical advice about side effects. You may report side effects to FDA at 1-800-FDA-1088. Where should I keep my medicine? Keep out of the reach of children. Store in a refrigerator between 2 and 8 degrees C (36 and 46 degrees F). Do not freeze or shake. Throw away any unused portion if using a single-dose vial. Multi-dose vials can be kept in the refrigerator for up to 21 days after the initial dose. Throw away unused medicine. NOTE: This sheet is a summary. It may not cover all possible information. If you have questions about this medicine, talk to your doctor, pharmacist, or health care provider.  2014, Elsevier/Gold Standard. (2008-06-11 10:25:44)  

## 2013-06-01 NOTE — Telephone Encounter (Signed)
Cancelled 11/28 lab and Injection per Dr. Bertis Ruddy, pt is going out of town, vave pt appt calendar for December 2014

## 2013-06-04 ENCOUNTER — Emergency Department (HOSPITAL_COMMUNITY): Payer: Medicare Other

## 2013-06-04 ENCOUNTER — Encounter (HOSPITAL_COMMUNITY): Payer: Self-pay | Admitting: Emergency Medicine

## 2013-06-04 ENCOUNTER — Inpatient Hospital Stay (HOSPITAL_COMMUNITY)
Admission: EM | Admit: 2013-06-04 | Discharge: 2013-06-14 | DRG: 292 | Disposition: A | Discharge status: Home / Self Care | Payer: Medicare Other | Attending: Interventional Cardiology | Admitting: Interventional Cardiology

## 2013-06-04 ENCOUNTER — Other Ambulatory Visit: Payer: Self-pay

## 2013-06-04 DIAGNOSIS — Z86718 Personal history of other venous thrombosis and embolism: Secondary | ICD-10-CM

## 2013-06-04 DIAGNOSIS — I25810 Atherosclerosis of coronary artery bypass graft(s) without angina pectoris: Secondary | ICD-10-CM | POA: Diagnosis present

## 2013-06-04 DIAGNOSIS — D638 Anemia in other chronic diseases classified elsewhere: Secondary | ICD-10-CM

## 2013-06-04 DIAGNOSIS — R5381 Other malaise: Secondary | ICD-10-CM | POA: Diagnosis present

## 2013-06-04 DIAGNOSIS — E119 Type 2 diabetes mellitus without complications: Secondary | ICD-10-CM

## 2013-06-04 DIAGNOSIS — I480 Paroxysmal atrial fibrillation: Secondary | ICD-10-CM | POA: Diagnosis present

## 2013-06-04 DIAGNOSIS — I2589 Other forms of chronic ischemic heart disease: Secondary | ICD-10-CM | POA: Diagnosis present

## 2013-06-04 DIAGNOSIS — I2510 Atherosclerotic heart disease of native coronary artery without angina pectoris: Secondary | ICD-10-CM

## 2013-06-04 DIAGNOSIS — E876 Hypokalemia: Secondary | ICD-10-CM | POA: Diagnosis present

## 2013-06-04 DIAGNOSIS — D631 Anemia in chronic kidney disease: Secondary | ICD-10-CM | POA: Diagnosis present

## 2013-06-04 DIAGNOSIS — Z8249 Family history of ischemic heart disease and other diseases of the circulatory system: Secondary | ICD-10-CM

## 2013-06-04 DIAGNOSIS — I160 Hypertensive urgency: Secondary | ICD-10-CM

## 2013-06-04 DIAGNOSIS — I701 Atherosclerosis of renal artery: Secondary | ICD-10-CM

## 2013-06-04 DIAGNOSIS — I739 Peripheral vascular disease, unspecified: Secondary | ICD-10-CM | POA: Diagnosis present

## 2013-06-04 DIAGNOSIS — E875 Hyperkalemia: Secondary | ICD-10-CM | POA: Diagnosis present

## 2013-06-04 DIAGNOSIS — M10372 Gout due to renal impairment, left ankle and foot: Secondary | ICD-10-CM | POA: Diagnosis not present

## 2013-06-04 DIAGNOSIS — I129 Hypertensive chronic kidney disease with stage 1 through stage 4 chronic kidney disease, or unspecified chronic kidney disease: Secondary | ICD-10-CM | POA: Diagnosis present

## 2013-06-04 DIAGNOSIS — K900 Celiac disease: Secondary | ICD-10-CM | POA: Diagnosis present

## 2013-06-04 DIAGNOSIS — Z87891 Personal history of nicotine dependence: Secondary | ICD-10-CM

## 2013-06-04 DIAGNOSIS — I10 Essential (primary) hypertension: Secondary | ICD-10-CM

## 2013-06-04 DIAGNOSIS — I4891 Unspecified atrial fibrillation: Secondary | ICD-10-CM | POA: Diagnosis present

## 2013-06-04 DIAGNOSIS — I509 Heart failure, unspecified: Secondary | ICD-10-CM

## 2013-06-04 DIAGNOSIS — I5033 Acute on chronic diastolic (congestive) heart failure: Principal | ICD-10-CM

## 2013-06-04 DIAGNOSIS — Z7982 Long term (current) use of aspirin: Secondary | ICD-10-CM

## 2013-06-04 DIAGNOSIS — D469 Myelodysplastic syndrome, unspecified: Secondary | ICD-10-CM | POA: Diagnosis present

## 2013-06-04 DIAGNOSIS — M109 Gout, unspecified: Secondary | ICD-10-CM | POA: Diagnosis present

## 2013-06-04 DIAGNOSIS — Z833 Family history of diabetes mellitus: Secondary | ICD-10-CM

## 2013-06-04 DIAGNOSIS — Z950 Presence of cardiac pacemaker: Secondary | ICD-10-CM

## 2013-06-04 DIAGNOSIS — E785 Hyperlipidemia, unspecified: Secondary | ICD-10-CM | POA: Diagnosis present

## 2013-06-04 DIAGNOSIS — N179 Acute kidney failure, unspecified: Secondary | ICD-10-CM

## 2013-06-04 DIAGNOSIS — I5032 Chronic diastolic (congestive) heart failure: Secondary | ICD-10-CM | POA: Diagnosis present

## 2013-06-04 DIAGNOSIS — K219 Gastro-esophageal reflux disease without esophagitis: Secondary | ICD-10-CM | POA: Diagnosis present

## 2013-06-04 DIAGNOSIS — Z951 Presence of aortocoronary bypass graft: Secondary | ICD-10-CM

## 2013-06-04 DIAGNOSIS — N184 Chronic kidney disease, stage 4 (severe): Secondary | ICD-10-CM | POA: Diagnosis present

## 2013-06-04 LAB — CBC WITH DIFFERENTIAL/PLATELET
Basophils Relative: 1 % (ref 0–1)
Eosinophils Absolute: 0.4 10*3/uL (ref 0.0–0.7)
Eosinophils Relative: 4 % (ref 0–5)
HCT: 32.8 % — ABNORMAL LOW (ref 39.0–52.0)
Hemoglobin: 10.6 g/dL — ABNORMAL LOW (ref 13.0–17.0)
Lymphs Abs: 1.5 10*3/uL (ref 0.7–4.0)
MCH: 32 pg (ref 26.0–34.0)
MCHC: 32.3 g/dL (ref 30.0–36.0)
MCV: 99.1 fL (ref 78.0–100.0)
Monocytes Relative: 11 % (ref 3–12)
Neutro Abs: 6 10*3/uL (ref 1.7–7.7)
Neutrophils Relative %: 67 % (ref 43–77)
Platelets: 124 10*3/uL — ABNORMAL LOW (ref 150–400)
RBC: 3.31 MIL/uL — ABNORMAL LOW (ref 4.22–5.81)

## 2013-06-04 LAB — BASIC METABOLIC PANEL
BUN: 24 mg/dL — ABNORMAL HIGH (ref 6–23)
Chloride: 108 mEq/L (ref 96–112)
GFR calc Af Amer: 36 mL/min — ABNORMAL LOW (ref >90)
GFR calc non Af Amer: 31 mL/min — ABNORMAL LOW (ref >90)
Potassium: 3.1 mEq/L — ABNORMAL LOW (ref 3.5–5.1)
Sodium: 147 mEq/L — ABNORMAL HIGH (ref 135–145)

## 2013-06-04 LAB — POCT I-STAT TROPONIN I: Troponin i, poc: 0.02 ng/mL (ref 0.00–0.08)

## 2013-06-04 LAB — PRO B NATRIURETIC PEPTIDE: Pro B Natriuretic peptide (BNP): 24561 pg/mL — ABNORMAL HIGH (ref 0–450)

## 2013-06-04 MED ORDER — FUROSEMIDE 10 MG/ML IJ SOLN
80.0000 mg | Freq: Once | INTRAMUSCULAR | Status: AC
  Administered 2013-06-04: 80 mg via INTRAVENOUS
  Filled 2013-06-04: qty 8

## 2013-06-04 NOTE — ED Provider Notes (Signed)
CSN: [id number]     Arrival date & time 06/04/13  1755 History   First MD Initiated Contact with Patient 06/04/13 1757     Chief Complaint  Patient presents with  . Chest Pain  . Shortness of Breath   (Consider location/radiation/quality/duration/timing/severity/associated sxs/prior Treatment) HPI  This is a 77 year old male with history diabetes, coronary artery disease status post CABG, hyperlipidemia, atrial fibrillation who presents from his primary care office with shortness of breath and chest pain. Patient reports shortness of breath that "wouldn't go away this morning." He denies any chest pain right now but states that he had chest pain at approximately 3:00.  Patient saw his primary care physician this afternoon and was noted to be bradycardic and hypertensive. Per report, he was as noted to have a new murmur on physical exam. He was sent here for further evaluation. Patient is not on home oxygen at baseline.  He reports increased bilateral lower extremity swelling. He has had no recent medication changes.  Past Medical History  Diagnosis Date  . Diabetes mellitus   . CAD (coronary artery disease)     s/b CABG '94  . Hypertension   . Anemia     Macrocytic  . Peripheral vascular disease   . Hyperlipidemia   . Cancer     prostate  . Ulcer   . GERD (gastroesophageal reflux disease)   . Liddle's syndrome   . Hemorrhoids   . Intestinal ischemia   . DVT (deep venous thrombosis)   . Atrial fibrillation     Paroxysmal. S/p PPM  . CHF (congestive heart failure)   . Prostate cancer 1997    XRT and lupron  . Anemia, unspecified 04/24/2013  . Thrombocytopenia, unspecified 04/24/2013  . MDS (myelodysplastic syndrome) 05/22/2013   Past Surgical History  Procedure Laterality Date  . Hiatal hernia repair      and ulcer repair  . Appendectomy    . Pacemaker insertion    . Coronary artery bypass graft  01/22/1993  . Cholecystectomy  Oct 2009    Gall Bladder  . Coronary artery  bypass graft  01/03/2012    Procedure: REDO CORONARY ARTERY BYPASS GRAFTING (CABG);  Surgeon: Alleen Borne, MD;  Location: [facility] OR;  Service: Open Heart Surgery;  Laterality: N/A;  Redo CABG x  using bilateral internal mammary arteries;  left leg greater saphenous vein harvested endoscopically  . Celiac artery angioplasty  05-16-12    and stenting   Family History  Problem Relation Age of Onset  . Diabetes Mother   . Heart disease Mother     Heart Disease before age 58  . Hyperlipidemia Mother   . Hypertension Mother   . Cancer Father     stomach/liver  . Cancer Sister     Breast cancer  . Diabetes Sister   . Heart disease Daughter     Heart Disease before age 59  . Hypertension Daughter   . Heart attack Daughter    History  Substance Use Topics  . Smoking status: Former Smoker    Types: Cigarettes    Quit date: 07/12/1977  . Smokeless tobacco: Never Used  . Alcohol Use: No    Review of Systems  Constitutional: Negative.  Negative for fever.  Respiratory: Positive for chest tightness and shortness of breath. Negative for cough.   Cardiovascular: Positive for chest pain and leg swelling.  Gastrointestinal: Negative.  Negative for abdominal pain.  Genitourinary: Negative.  Negative for dysuria.  Musculoskeletal: Negative for  back pain.  Skin: Negative for rash.  Neurological: Negative for headaches.  All other systems reviewed and are negative.    Allergies  Ace inhibitors and Ibuprofen  Home Medications   Current Outpatient Rx  Name  Route  Sig  Dispense  Refill  . acetaminophen (ARTHRITIS PAIN RELIEF) 650 MG CR tablet   Oral   Take 1,300 mg by mouth 2 (two) times daily.          Marland Kitchen amiodarone (PACERONE) 200 MG tablet   Oral   Take 100 mg by mouth daily.         Marland Kitchen amLODipine (NORVASC) 10 MG tablet   Oral   Take 10 mg by mouth daily.         Marland Kitchen aspirin 325 MG tablet   Oral   Take 325 mg by mouth daily.         Marland Kitchen atorvastatin (LIPITOR) 80 MG  tablet   Oral   Take 80 mg by mouth daily.         Marland Kitchen CALCIUM PO   Oral   Take 2,000 mg by mouth daily.          . cholecalciferol (VITAMIN D) 1000 UNITS tablet   Oral   Take 1,000 Units by mouth daily.          . cloNIDine (CATAPRES) 0.2 MG tablet   Oral   Take 0.2 mg by mouth 2 (two) times daily.          . clopidogrel (PLAVIX) 75 MG tablet   Oral   Take 75 mg by mouth daily.         . Cyanocobalamin (VITAMIN B-12 IJ)   Injection   Inject as directed.         . furosemide (LASIX) 80 MG tablet   Oral   Take 80 mg by mouth daily.          . metoprolol (LOPRESSOR) 50 MG tablet   Oral   Take 50 mg by mouth 2 (two) times daily.         . pantoprazole (PROTONIX) 40 MG tablet   Oral   Take 40 mg by mouth daily.         . potassium chloride SA (K-DUR,KLOR-CON) 20 MEQ tablet   Oral   Take 20-40 mEq by mouth 2 (two) times daily. 20 meq in the evening and 40 meq in the a.m.         Marland Kitchen silodosin (RAPAFLO) 8 MG CAPS capsule   Oral   Take 8 mg by mouth daily.          Marland Kitchen glucose blood test strip                BP 207/79  Pulse 91  Temp(Src) 97.7 F (36.5 C) (Oral)  Resp 12  SpO2 99% Physical Exam  Nursing note and vitals reviewed. Constitutional: He is oriented to person, place, and time. No distress.  Elderly  HENT:  Head: Normocephalic and atraumatic.  Eyes: Pupils are equal, round, and reactive to light.  Neck: Neck supple.  Cardiovascular: Normal rate and regular rhythm.   Murmur heard. Irregular rhythm, blowing 2/6 mitral murmur heard best at the left lower sternal border  Pulmonary/Chest: Effort normal. No respiratory distress. He has no wheezes.  Decreased breath sounds at the bases  Abdominal: Soft. Bowel sounds are normal. There is no tenderness. There is no rebound.  Musculoskeletal: He exhibits edema.  3+ bilateral lower extremity  edema  Lymphadenopathy:    He has no cervical adenopathy.  Neurological: He is alert and  oriented to person, place, and time.  Skin: Skin is warm and dry.  Psychiatric: He has a normal mood and affect.    ED Course  Procedures (including critical care time) Labs Review Labs Reviewed  CBC WITH DIFFERENTIAL - Abnormal; Notable for the following:    RBC 3.31 (*)    Hemoglobin 10.6 (*)    HCT 32.8 (*)    RDW 18.0 (*)    Platelets 124 (*)    All other components within normal limits  BASIC METABOLIC PANEL - Abnormal; Notable for the following:    Sodium 147 (*)    Potassium 3.1 (*)    Glucose, Bld 136 (*)    BUN 24 (*)    Creatinine, Ser 2.01 (*)    GFR calc non Af Amer 31 (*)    GFR calc Af Amer 36 (*)    All other components within normal limits  PRO B NATRIURETIC PEPTIDE - Abnormal; Notable for the following:    Pro B Natriuretic peptide (BNP) 24561.0 (*)    All other components within normal limits  POCT I-STAT TROPONIN I   Imaging Review Dg Chest Portable 1 View  06/04/2013   CLINICAL DATA:  Chest pain  EXAM: PORTABLE CHEST - 1 VIEW  COMPARISON:  02/01/2012  FINDINGS: Cardiac shadow is mildly enlarged. A 2 lead pacing device is again seen and stable. Lungs demonstrate mild chronic changes. Postsurgical changes are again seen. No acute focal abnormality is noted.  IMPRESSION: No acute abnormality seen.   Electronically Signed   By: Alcide Clever M.D.   On: 06/04/2013 18:54    EKG Interpretation   None      EKG independently reviewed by myself: Normal sinus rhythm with a rate of 60, no evidence of acute ST elevation, prolonged QT MDM   1. Congestive heart failure    This is a 77 year old male who presents with shortness of breath. He initially presented to his primary care office and there were concerns for bradycardia and a new murmur. Patient's EKG is a sinus rhythm with a rate of 60. He is complaining of shortness of breath and had chest pain earlier today. Initial troponin is negative. Chest x-ray is within normal limits. EKG is nonischemic. BNP is elevated  to 20 10/15/1957. Patient was given his home dose of Lasix 80 mg IV. His pacemaker was interrogated without evidence of events.  His last episode of A. fib was in October. He will need to be admitted for diuresis and serial troponins.   Shon Baton, MD 06/04/13 2212

## 2013-06-04 NOTE — ED Notes (Signed)
Pt denies feeling SOB, CP, dizziness, pain at this time

## 2013-06-04 NOTE — ED Notes (Signed)
Introduced self to patient. Pt denies SOB and CP at present.  Reports last episode approx 1600 today and it did not last but a short while.  Denies needs at this time.  Updated pt on plan of care.

## 2013-06-04 NOTE — ED Notes (Signed)
Contacted lab; BNP in process, another 20" expected for results.

## 2013-06-04 NOTE — ED Notes (Signed)
Pacer tech here, interrogating pacer.

## 2013-06-04 NOTE — ED Notes (Addendum)
Pt arrived by [facility] from [facility] Physician. Pt sent d/t new murmur and CP. Pt c/o left sided CP stated that he was SOB last night and this morning with some dizziness. Pain increases with palpation. Sinus Brady on monitor and Hypertensive. Pt has hx of HTN and had a f/u this week and they increased BP medications. BP-200/98 HR-55

## 2013-06-04 NOTE — ED Notes (Signed)
Admitting physician with emergent case in Cath Lab.  Pt and family updated re: wait for MD for admission.  Pt up to bedside using urinal.

## 2013-06-04 NOTE — ED Notes (Signed)
Pt found pacer card.  Will contact company to interrogate pacer.

## 2013-06-05 ENCOUNTER — Encounter (HOSPITAL_COMMUNITY): Payer: Self-pay | Admitting: Internal Medicine

## 2013-06-05 DIAGNOSIS — I509 Heart failure, unspecified: Secondary | ICD-10-CM

## 2013-06-05 DIAGNOSIS — I25810 Atherosclerosis of coronary artery bypass graft(s) without angina pectoris: Secondary | ICD-10-CM | POA: Diagnosis present

## 2013-06-05 DIAGNOSIS — I5033 Acute on chronic diastolic (congestive) heart failure: Principal | ICD-10-CM

## 2013-06-05 DIAGNOSIS — I5043 Acute on chronic combined systolic (congestive) and diastolic (congestive) heart failure: Secondary | ICD-10-CM

## 2013-06-05 DIAGNOSIS — I2510 Atherosclerotic heart disease of native coronary artery without angina pectoris: Secondary | ICD-10-CM

## 2013-06-05 DIAGNOSIS — I10 Essential (primary) hypertension: Secondary | ICD-10-CM

## 2013-06-05 DIAGNOSIS — I059 Rheumatic mitral valve disease, unspecified: Secondary | ICD-10-CM

## 2013-06-05 DIAGNOSIS — E876 Hypokalemia: Secondary | ICD-10-CM

## 2013-06-05 DIAGNOSIS — I4891 Unspecified atrial fibrillation: Secondary | ICD-10-CM

## 2013-06-05 DIAGNOSIS — N184 Chronic kidney disease, stage 4 (severe): Secondary | ICD-10-CM

## 2013-06-05 LAB — COMPREHENSIVE METABOLIC PANEL
ALT: 19 U/L (ref 0–53)
AST: 26 U/L (ref 0–37)
Albumin: 3.3 g/dL — ABNORMAL LOW (ref 3.5–5.2)
Albumin: 3.2 g/dL — ABNORMAL LOW (ref 3.5–5.2)
Alkaline Phosphatase: 69 U/L (ref 39–117)
Alkaline Phosphatase: 72 U/L (ref 39–117)
BUN: 22 mg/dL (ref 6–23)
BUN: 26 mg/dL — ABNORMAL HIGH (ref 6–23)
CO2: 28 mEq/L (ref 19–32)
Calcium: 9.1 mg/dL (ref 8.4–10.5)
Chloride: 104 mEq/L (ref 96–112)
Chloride: 100 mEq/L (ref 96–112)
Creatinine, Ser: 2.03 mg/dL — ABNORMAL HIGH (ref 0.50–1.35)
GFR calc Af Amer: 35 mL/min — ABNORMAL LOW (ref >90)
GFR calc Af Amer: 31 mL/min — ABNORMAL LOW (ref >90)
Glucose, Bld: 135 mg/dL — ABNORMAL HIGH (ref 70–99)
Glucose, Bld: 186 mg/dL — ABNORMAL HIGH (ref 70–99)
Potassium: 2.8 mEq/L — ABNORMAL LOW (ref 3.5–5.1)
Potassium: 3.2 mEq/L — ABNORMAL LOW (ref 3.5–5.1)
Total Bilirubin: 0.4 mg/dL (ref 0.3–1.2)
Total Bilirubin: 0.4 mg/dL (ref 0.3–1.2)
Total Protein: 6.7 g/dL (ref 6.0–8.3)

## 2013-06-05 LAB — CBC
HCT: 31.3 % — ABNORMAL LOW (ref 39.0–52.0)
MCH: 31.8 pg (ref 26.0–34.0)
MCHC: 32.3 g/dL (ref 30.0–36.0)
Platelets: 116 10*3/uL — ABNORMAL LOW (ref 150–400)
RDW: 18 % — ABNORMAL HIGH (ref 11.5–15.5)

## 2013-06-05 LAB — TROPONIN I
Troponin I: <0.3 ng/mL (ref <0.30)
Troponin I: <0.3 ng/mL (ref <0.30)
Troponin I: <0.3 ng/mL (ref <0.30)

## 2013-06-05 LAB — CK TOTAL AND CKMB (NOT AT ARMC)
CK, MB: 2.8 ng/mL (ref 0.3–4.0)
Total CK: 105 U/L (ref 7–232)
Total CK: 110 U/L (ref 7–232)

## 2013-06-05 LAB — MRSA PCR SCREENING: MRSA by PCR: NEGATIVE

## 2013-06-05 MED ORDER — CLOPIDOGREL BISULFATE 75 MG PO TABS
75.0000 mg | ORAL_TABLET | Freq: Every day | ORAL | Status: DC
  Administered 2013-06-05 – 2013-06-14 (×10): 75 mg via ORAL
  Filled 2013-06-05 (×12): qty 1

## 2013-06-05 MED ORDER — BUMETANIDE 0.25 MG/ML IJ SOLN
1.0000 mg | Freq: Two times a day (BID) | INTRAMUSCULAR | Status: DC
  Administered 2013-06-05: 1 mg via INTRAVENOUS
  Filled 2013-06-05 (×2): qty 4

## 2013-06-05 MED ORDER — PANTOPRAZOLE SODIUM 40 MG PO TBEC
40.0000 mg | DELAYED_RELEASE_TABLET | Freq: Every day | ORAL | Status: DC
  Administered 2013-06-05 – 2013-06-14 (×10): 40 mg via ORAL
  Filled 2013-06-05 (×7): qty 1

## 2013-06-05 MED ORDER — HYDRALAZINE HCL 20 MG/ML IJ SOLN
10.0000 mg | Freq: Once | INTRAMUSCULAR | Status: AC
  Administered 2013-06-05: 10 mg via INTRAVENOUS
  Filled 2013-06-05: qty 1

## 2013-06-05 MED ORDER — ISOSORBIDE MONONITRATE ER 60 MG PO TB24
60.0000 mg | ORAL_TABLET | Freq: Every day | ORAL | Status: DC
  Filled 2013-06-05: qty 1

## 2013-06-05 MED ORDER — POTASSIUM CHLORIDE CRYS ER 20 MEQ PO TBCR
20.0000 meq | EXTENDED_RELEASE_TABLET | Freq: Two times a day (BID) | ORAL | Status: DC
  Administered 2013-06-05 (×2): 20 meq via ORAL
  Filled 2013-06-05 (×4): qty 1

## 2013-06-05 MED ORDER — AMLODIPINE BESYLATE 10 MG PO TABS
10.0000 mg | ORAL_TABLET | Freq: Every day | ORAL | Status: DC
  Administered 2013-06-05 – 2013-06-14 (×10): 10 mg via ORAL
  Filled 2013-06-05 (×10): qty 1

## 2013-06-05 MED ORDER — METOPROLOL TARTRATE 50 MG PO TABS
50.0000 mg | ORAL_TABLET | Freq: Two times a day (BID) | ORAL | Status: DC
  Administered 2013-06-05 (×2): 50 mg via ORAL
  Filled 2013-06-05 (×4): qty 1

## 2013-06-05 MED ORDER — NITROGLYCERIN IN D5W 200-5 MCG/ML-% IV SOLN
2.0000 ug/min | INTRAVENOUS | Status: DC
  Administered 2013-06-05: 130 ug/min via INTRAVENOUS
  Administered 2013-06-05: 5 ug/min via INTRAVENOUS
  Administered 2013-06-06: 200 ug/min via INTRAVENOUS
  Filled 2013-06-05 (×5): qty 250

## 2013-06-05 MED ORDER — ISOSORBIDE MONONITRATE ER 30 MG PO TB24
30.0000 mg | ORAL_TABLET | Freq: Every day | ORAL | Status: DC
  Administered 2013-06-05: 30 mg via ORAL
  Filled 2013-06-05: qty 1

## 2013-06-05 MED ORDER — ASPIRIN 325 MG PO TABS
325.0000 mg | ORAL_TABLET | Freq: Every day | ORAL | Status: DC
  Administered 2013-06-05 – 2013-06-14 (×10): 325 mg via ORAL
  Filled 2013-06-05 (×10): qty 1

## 2013-06-05 MED ORDER — FUROSEMIDE 10 MG/ML IJ SOLN: 80.0000 mg | Freq: Once | INTRAMUSCULAR | Status: DC

## 2013-06-05 MED ORDER — FUROSEMIDE 10 MG/ML IJ SOLN
80.0000 mg | Freq: Two times a day (BID) | INTRAMUSCULAR | Status: DC
  Administered 2013-06-05 – 2013-06-08 (×6): 80 mg via INTRAVENOUS
  Filled 2013-06-05 (×9): qty 8

## 2013-06-05 MED ORDER — HYDRALAZINE HCL 10 MG PO TABS
10.0000 mg | ORAL_TABLET | Freq: Three times a day (TID) | ORAL | Status: DC
  Administered 2013-06-05 – 2013-06-06 (×4): 10 mg via ORAL
  Filled 2013-06-05 (×8): qty 1

## 2013-06-05 MED ORDER — FUROSEMIDE 10 MG/ML IJ SOLN
80.0000 mg | Freq: Once | INTRAMUSCULAR | Status: AC
  Administered 2013-06-05: 80 mg via INTRAVENOUS
  Filled 2013-06-05: qty 8

## 2013-06-05 MED ORDER — TAMSULOSIN HCL 0.4 MG PO CAPS
0.4000 mg | ORAL_CAPSULE | Freq: Every day | ORAL | Status: DC
  Administered 2013-06-05 – 2013-06-14 (×10): 0.4 mg via ORAL
  Filled 2013-06-05 (×10): qty 1

## 2013-06-05 MED ORDER — ATORVASTATIN CALCIUM 80 MG PO TABS
80.0000 mg | ORAL_TABLET | Freq: Every day | ORAL | Status: DC
  Administered 2013-06-05 – 2013-06-14 (×10): 80 mg via ORAL
  Filled 2013-06-05 (×10): qty 1

## 2013-06-05 MED ORDER — POTASSIUM CHLORIDE CRYS ER 20 MEQ PO TBCR
40.0000 meq | EXTENDED_RELEASE_TABLET | Freq: Once | ORAL | Status: AC
  Administered 2013-06-05: 40 meq via ORAL
  Filled 2013-06-05: qty 2

## 2013-06-05 MED ORDER — CLONIDINE HCL 0.2 MG PO TABS
0.2000 mg | ORAL_TABLET | Freq: Once | ORAL | Status: AC
  Administered 2013-06-05: 0.2 mg via ORAL
  Filled 2013-06-05: qty 1

## 2013-06-05 MED ORDER — CLONIDINE HCL 0.1 MG PO TABS
0.1000 mg | ORAL_TABLET | Freq: Two times a day (BID) | ORAL | Status: DC
  Administered 2013-06-05 – 2013-06-07 (×6): 0.1 mg via ORAL
  Filled 2013-06-05 (×6): qty 1

## 2013-06-05 NOTE — Progress Notes (Signed)
CSW received consult for Advance Directives. CSW referred this on to the [facility], who will follow up with patient. CSW signing off at this time. Please re consult if social work needs arise.  Maree Krabbe, MSW, Theresia Majors [phone]

## 2013-06-05 NOTE — ED Notes (Signed)
Report to [facility] on 4N who voiced concern over his high blood pressure.  She is not sure he can be placed on 4N.  Will call her CN and contact me regarding decision.

## 2013-06-05 NOTE — ED Notes (Signed)
Dr Beckett Callas contacted for pt admission orders; he was involved in an emergency in ccu.  Will place order for room and clonidine po for pt since apresoline has not had an effect on HTN.

## 2013-06-05 NOTE — Progress Notes (Signed)
Provided patient with Advance Directive documents to review with his wife and answered his questions about HPOA/Living Will. Please page chaplain if patient decides to complete the document.   Maurene Capes, [place] [phone]

## 2013-06-05 NOTE — Progress Notes (Signed)
Report called to ED RN, Britta Mccreedy. Pt noted to have high BP >200 systolic. Rapid Response called to assess patient's appropriateness for 4E telemetry floor. Rapid Response RN states pt BP 245/87 and is not appropriate at this time. Britta Mccreedy, ED RN notified that Rapid Response and 4E Charge nurse state patient is not appropriate for this floor at this time. Baron Hamper, RN

## 2013-06-05 NOTE — Progress Notes (Signed)
  Echocardiogram 2D Echocardiogram has been performed.  [name] 06/05/2013, 11:41 AM

## 2013-06-05 NOTE — Plan of Care (Signed)
Problem: Phase I Progression Outcomes Goal: Hemodynamically stable Outcome: Not Progressing Pt remains hypertensive

## 2013-06-05 NOTE — ED Notes (Signed)
Pt not suitable for 4N.  Dr [place] Callas contacted.  He will write new admission orders and give more orders for HTN.

## 2013-06-05 NOTE — H&P (Addendum)
History and Physical   Patient ID: [name] PAM Sr. MRN: [id number], DOB/AGE: 75-Jul-1937   Admit date: 06/04/2013 Date of Consult: 06/05/2013   Primary Physician: Mickie Hillier, MD Primary Cardiologist: Dr. Verdis Prime, [facility]  HPI: [name] BEAVER Sr. is a 77 y.o. male with PMHx of NIDDM, HTN, chronic IHD complicated by HFpEF=55% s/p single vessel SVG to rPDA in 1994 by Dr. Laneta Simmers followed by repeat CABG with LIMA to LAD, free RIMA to rPDA, SVG sequentially to OM1+OM2, h/o PAF on Amiodarone for maintenance of SR (though not on NOAC/warfarin) and is s/p dual chamber PPM, PAD s/p celiac Herculink stent by Dr. [name] 05/16/12 and repeat PTA for ISR of celiac stent in August 2014, MDS s/p recent BMBx in October 2014 on weekly Procrit injections for related anemia and chronic thrombocytopenia related to MDS.  He recently rescheduled a Procrit injection appointment as he was planning on going to [facility] to visit his son for Thanksgiving however over the last several days he has experienced worsening BP control along with increased DOE and leg swelling.  He went to his PCP's office today and they noted the increased weight gain, leg swelling, shortness of breath and also were concerned for bradycardia long with reports of a "new murmur" on their physical exam.  For these reasons, the patient was sent to the [facility] ED.  Upon arrival to our ED the patient was markedly hypertensive 245/100 mmHg and c/o SOB.  He received 80mg  IV Lasix and 10mg  IV Hydralazine along with 0.2 mg po Clonidine.  BP's remained uncontrolled but he did produce 1.3 L of UOP with notable improvement in his dyspnea.  Cardiology was consulted for consideration of inpatient admission for CHF exacerbation treatment given the above history, signs, symptoms and labwork showing Pro-BNP=24,561.  Initial Tn was 0.02.  12-lead ECG showed SR at 60 bpm, LAE and prolonged QTc=562 msec.  Patient was chest pain free at time of ED evaluation.   Of note, last 2D surface Echo of 03/19/13 showed LVEF=55%, mild MR, mild TR.  Problem List: Past Medical History  Diagnosis Date  . Diabetes mellitus   . CAD (coronary artery disease)     s/b CABG '94  . Hypertension   . Anemia     Macrocytic  . Peripheral vascular disease   . Hyperlipidemia   . Cancer     prostate  . Ulcer   . GERD (gastroesophageal reflux disease)   . Liddle's syndrome   . Hemorrhoids   . Intestinal ischemia   . DVT (deep venous thrombosis)   . Atrial fibrillation     Paroxysmal. S/p PPM  . CHF (congestive heart failure)   . Prostate cancer 1997    XRT and lupron  . Anemia, unspecified 04/24/2013  . Thrombocytopenia, unspecified 04/24/2013  . MDS (myelodysplastic syndrome) 05/22/2013    Past Surgical History  Procedure Laterality Date  . Hiatal hernia repair      and ulcer repair  . Appendectomy    . Pacemaker insertion    . Coronary artery bypass graft  01/22/1993  . Cholecystectomy  Oct 2009    Gall Bladder  . Coronary artery bypass graft  01/03/2012    Procedure: REDO CORONARY ARTERY BYPASS GRAFTING (CABG);  Surgeon: Alleen Borne, MD;  Location: [facility] OR;  Service: Open Heart Surgery;  Laterality: N/A;  Redo CABG x  using bilateral internal mammary arteries;  left leg greater saphenous vein harvested endoscopically  . Celiac artery angioplasty  05-16-12    and stenting     Allergies:  Allergies  Allergen Reactions  . Ace Inhibitors Cough  . Ibuprofen Other (See Comments)    GI Issues    Home Medications: Prior to Admission medications   Medication Sig Start Date End Date Taking? Authorizing Provider  acetaminophen (ARTHRITIS PAIN RELIEF) 650 MG CR tablet Take 1,300 mg by mouth 2 (two) times daily.    Yes Historical Provider, MD  amiodarone (PACERONE) 200 MG tablet Take 100 mg by mouth daily. 05/08/13  Yes Lyn Records III, MD  amLODipine (NORVASC) 10 MG tablet Take 10 mg by mouth daily.   Yes Historical Provider, MD  aspirin 325 MG tablet  Take 325 mg by mouth daily.   Yes Historical Provider, MD  atorvastatin (LIPITOR) 80 MG tablet Take 80 mg by mouth daily.   Yes Historical Provider, MD  CALCIUM PO Take 2,000 mg by mouth daily.    Yes Historical Provider, MD  cholecalciferol (VITAMIN D) 1000 UNITS tablet Take 1,000 Units by mouth daily.    Yes Historical Provider, MD  cloNIDine (CATAPRES) 0.2 MG tablet Take 0.2 mg by mouth 2 (two) times daily.    Yes Historical Provider, MD  clopidogrel (PLAVIX) 75 MG tablet Take 75 mg by mouth daily. 05/18/12  Yes Regina J Roczniak, PA-C  Cyanocobalamin (VITAMIN B-12 IJ) Inject as directed.   Yes Historical Provider, MD  furosemide (LASIX) 80 MG tablet Take 80 mg by mouth daily.    Yes Historical Provider, MD  glucose blood test strip  02/12/13  Yes Historical Provider, MD  metoprolol (LOPRESSOR) 50 MG tablet Take 50 mg by mouth 2 (two) times daily.   Yes Historical Provider, MD  pantoprazole (PROTONIX) 40 MG tablet Take 40 mg by mouth daily.   Yes Historical Provider, MD  potassium chloride SA (K-DUR,KLOR-CON) 20 MEQ tablet Take 20-40 mEq by mouth 2 (two) times daily. 20 meq in the evening and 40 meq in the a.m.   Yes Historical Provider, MD  silodosin (RAPAFLO) 8 MG CAPS capsule Take 8 mg by mouth daily.    Yes Historical Provider, MD    Inpatient Medications:    (Not in a hospital admission)  Family History  Problem Relation Age of Onset  . Diabetes Mother   . Heart disease Mother     Heart Disease before age 20  . Hyperlipidemia Mother   . Hypertension Mother   . Cancer Father     stomach/liver  . Cancer Sister     Breast cancer  . Diabetes Sister   . Heart disease Daughter     Heart Disease before age 50  . Hypertension Daughter   . Heart attack Daughter      History   Social History  . Marital Status: Married    Spouse Name: N/A    Number of Children: N/A  . Years of Education: N/A   Occupational History  . Not on file.   Social History Main Topics  . Smoking  status: Former Smoker    Types: Cigarettes    Quit date: 07/12/1977  . Smokeless tobacco: Never Used  . Alcohol Use: No  . Drug Use: No  . Sexual Activity: Yes   Other Topics Concern  . Not on file   Social History Narrative  . No narrative on file     Review of Systems: All other systems reviewed and are otherwise negative except as noted above.  Physical Exam: Blood pressure 213/82, pulse  63, temperature 97.7 F (36.5 C), temperature source Oral, resp. rate 8, SpO2 99.00%. General: Well developed, well nourished, in no acute distress. Head: Normocephalic, atraumatic, sclera non-icteric, no xanthomas, nares are without discharge.  Neck: Negative for carotid bruits. JVP=20 cmH20. Lungs: Bibasilar crackles, no wheezes/ronchi. Heart: RRR with S1 S2. No murmurs, rubs, or gallops appreciated. Abdomen: Soft, non-tender, mildly distended with normoactive bowel sounds. No hepatomegaly. No rebound/guarding. No obvious abdominal masses. Msk:  Strength and tone appears normal for age. Extremities: No clubbing, cyanosis.  B/L 3+ pitting LE edema.  Distal pedal pulses are 2+ and equal bilaterally. Neuro: Alert and oriented X 3. Moves all extremities spontaneously. Psych:  Responds to questions appropriately with a normal affect.  Labs: Recent Labs     06/04/13  1849  WBC  8.9  HGB  10.6*  HCT  32.8*  MCV  99.1  PLT  124*   No results found for this basename: VITAMINB12, FOLATE, FERRITIN, TIBC, IRON, RETICCTPCT,  in the last 72 hours No results found for this basename: DDIMER,  in the last 72 hours  Recent Labs Lab 06/04/13 1849  NA 147*  K 3.1*  CL 108  CO2 29  BUN 24*  CREATININE 2.01*  CALCIUM 9.8  GLUCOSE 136*   No results found for this basename: HGBA1C,  in the last 72 hours No results found for this basename: CKTOTAL, CKMB, CKMBINDEX, TROPONINI,  in the last 72 hours No components found with this basename: POCBNP,  No results found for this basename: CHOL, HDL,  LDLCALC, TRIG, CHOLHDL, LDLDIRECT,  in the last 72 hours No results found for this basename: TSH, T4TOTAL, FREET3, T3FREE, THYROIDAB,  in the last 72 hours  Radiology/Studies: Dg Chest Portable 1 View  06/04/2013   CLINICAL DATA:  Chest pain  EXAM: PORTABLE CHEST - 1 VIEW  COMPARISON:  02/01/2012  FINDINGS: Cardiac shadow is mildly enlarged. A 2 lead pacing device is again seen and stable. Lungs demonstrate mild chronic changes. Postsurgical changes are again seen. No acute focal abnormality is noted.  IMPRESSION: No acute abnormality seen.   Electronically Signed   By: Alcide Clever M.D.   On: 06/04/2013 18:54    06/04/13 12-lead ECG's:   SR at 60 bpm, LAE and prolonged QTc=562 msec.  No ST-T wave abnormalities.  ASSESSMENT:  [name] is a 77 yo AA Male with PMHx of NIDDM (last HbA1C=5.7% in September 2014-diet controlled), HTN, chronic IHD complicated by HFpEF=55% s/p single vessel SVG to rPDA in 1994 by Dr. Laneta Simmers followed by repeat CABG with LIMA to LAD, free RIMA to rPDA, SVG sequentially to OM1+OM2, h/o PAF on Amiodarone for maintenance of SR (though not on NOAC/warfarin) and is s/p dual chamber PPM, PAD s/p celiac Herculink stent by Dr. [name] 05/16/12 and repeat PTA for ISR of celiac stent in August 2014, MDS s/p recent BMBx in October 2014 on weekly Procrit injections for related anemia and chronic thrombocytopenia related to MDS.  He presents with decompensated diastolic/combined HF.  PLAN:  1-Admit to Telemetry floor level of care; service of Dr. [name] of [facility]. 2-Acute on Chronic Decompensated Diastolic/combined HF Exacerbation:  Patient is well perfused, in no respiratory distress, continue IV diuresis with Bumex 1mg  12 hours, strict I&O's, BMP's q12 hrs, daily weights, fluid restriction 1.5L/day, repeat 2D surface Echo to ensure that LVEF has not decreased/worsened.  Will additionally complete R/O ACS.  Plan to repeat Pro-BNP prior to hospital discharge to document  improvement and establishment of new  baseline.  Holding Metoprolol 50mg  po BID for the time being given acute fluid overloaded state.  BB may be re-started once fluid status is optimized. 3-HTN Urgency:  Ideally, clonidine not preferable due to rebound effects and patient reports that his home BP regimen has not been effectively as of recently.  We will initiate acute treatment with NTG infusion.  In order to achieve long-term control with oral agents we will continue amlodipine but will also start Hydralazine+Nitrates as patient not a candidate for ACE-inhibitor/ARB due to allergies and CKD.  Wean clonidine 0.1mg  po BID moving forward with eventual goal for discontinuance if possible. 4-Chronic IHD: No evidence of ACS but will complete rule-out as stated above. 5-PAF on Amiodarone:  Currently in SR but given prolonged QTc ~562 msec will discontinue Amiodarone for time being as it can prolong the QT interval.  Avoid QT prolonging agents and repeat 12-lead ECG in AM to follow-up. 6-NIDDM:  Last HbA1C was 5.7% in September 2014 thus it is currently diet controlled. 7-PAD:  Compensated, no evidence of abdominal pain to suggest mesenteric ischemia issues. 8-GERD:  Continue home dose PPI. 9-DVT PPx with SCD's for now; hold on Heparin PPx until BP's are more optimized.  Code Status:  FULL CODE.   Signed, Christie Nottingham, MD Cardiology [facility] 06/05/2013, 2:09 AM

## 2013-06-05 NOTE — ED Notes (Signed)
Report to 2S RN.  Pt to floor on monitor with RN accompanying.

## 2013-06-05 NOTE — Progress Notes (Signed)
Subjective:  Still HTN but improved.  No complaints of SOB or CP Sleepy Mild HA (NTG likely)  Objective:  Vital Signs in the last 24 hours: Temp:  [97.5 F (36.4 C)-98.4 F (36.9 C)] 98.4 F (36.9 C) (11/25 1103) Pulse Rate:  [56-91] 82 (11/25 1045) Resp:  [0-23] 23 (11/25 1045) BP: (133-247)/(59-137) 196/78 mmHg (11/25 1045) SpO2:  [96 %-100 %] 96 % (11/25 1045) Weight:  [171 lb 1.2 oz (77.6 kg)] 171 lb 1.2 oz (77.6 kg) (11/25 0410)  Intake/Output from previous day: 11/24 0701 - 11/25 0700 In: 66.3 [I.V.:66.3] Out: 2650 [Urine:2650]  . amLODipine  10 mg Oral Daily  . aspirin  325 mg Oral Daily  . atorvastatin  80 mg Oral Daily  . bumetanide (BUMEX) IV  1 mg Intravenous Q12H  . cloNIDine  0.1 mg Oral BID  . clopidogrel  75 mg Oral Q breakfast  . furosemide  80 mg Intravenous Once  . hydrALAZINE  10 mg Oral Q8H  . isosorbide mononitrate  30 mg Oral Daily  . metoprolol  50 mg Oral BID  . pantoprazole  40 mg Oral Daily  . potassium chloride SA  20 mEq Oral BID  . potassium chloride  40 mEq Oral Once  . tamsulosin  0.4 mg Oral Daily    Physical Exam: General: Well developed, well nourished, in no acute distress. Head:  Normocephalic and atraumatic. Lungs: Clear to auscultation and percussion. Heart: Normal S1 and S2.  2/6 systolic murmur, rubs or gallops.  Abdomen: soft, non-tender, positive bowel sounds. Soft bruit  Extremities: No clubbing or cyanosis. Mild pretib edema. Neurologic: Alert and oriented x 3.    Lab Results:  Recent Labs  06/04/13 1849 06/05/13 0730  WBC 8.9 10.4  HGB 10.6* 10.1*  PLT 124* 116*    Recent Labs  06/04/13 1849 06/05/13 0730  NA 147* 145  K 3.1* 2.8*  CL 108 104  CO2 29 28  GLUCOSE 136* 135*  BUN 24* 22  CREATININE 2.01* 2.03*    Recent Labs  06/05/13 0730  TROPONINI <0.30   Hepatic Function Panel  Recent Labs  06/05/13 0730  PROT 6.7  ALBUMIN 3.3*  AST 26  ALT 22  ALKPHOS 69  BILITOT 0.4     Imaging: Dg Chest Portable 1 View  06/04/2013   CLINICAL DATA:  Chest pain  EXAM: PORTABLE CHEST - 1 VIEW  COMPARISON:  02/01/2012  FINDINGS: Cardiac shadow is mildly enlarged. A 2 lead pacing device is again seen and stable. Lungs demonstrate mild chronic changes. Postsurgical changes are again seen. No acute focal abnormality is noted.  IMPRESSION: No acute abnormality seen.   Electronically Signed   By: Alcide Clever M.D.   On: 06/04/2013 18:54   Personally viewed.   Telemetry: NSR Personally viewed.   EKG:  NSR, NSSTW flattening  Cardiac Studies:  ECHO being done currently - EF appears normal . Prior ECHO 03/19/13 - mod LVH, normal EF, grade 1 DD. Mild MR, bicuspid AV  Single vessel SVG to rPDA in 1994 by Dr. Laneta Simmers followed by repeat CABG with LIMA to LAD, free RIMA to rPDA, SVG sequentially to OM1+OM2   Assessment/Plan:   1) Hypertensive urgency  - Trying to wean NTG gtt today.  - I will give IV lasix 80 mg x 1.   - Replete KCL  - He has been given his 10am meds and BP is mildly improved.   - No angina currently.   -  Will increase imdur to 60.   2) Acute on chronic diastolic HF  - IV lasix 80 x 1 then continue BID.   - hopefully, decreasing intravascular volume will help with HTN    - comfortable  3) Chronic kidney disease stage 4  - monitor, especially with diuretic admin.   4) CAD  - complex. Stable. Troponin normal.   5) PAF  - no AFIB currently.   - AMIO  Complex medical situation.   [name], [name] 06/05/2013, 11:03 AM

## 2013-06-05 NOTE — ED Notes (Signed)
Pt continues to void without difficulty, filling urinal.  Understands waiting on cardiologist to see him for admission.  Discussed lasix effectiveness as pt was unaware of the significance of its value.

## 2013-06-06 DIAGNOSIS — I701 Atherosclerosis of renal artery: Secondary | ICD-10-CM

## 2013-06-06 DIAGNOSIS — I10 Essential (primary) hypertension: Secondary | ICD-10-CM

## 2013-06-06 LAB — CK TOTAL AND CKMB (NOT AT ARMC)
CK, MB: 1.9 ng/mL (ref 0.3–4.0)
CK, MB: 1.4 ng/mL (ref 0.3–4.0)
CK, MB: 1.8 ng/mL (ref 0.3–4.0)
CK, MB: 1.6 ng/mL (ref 0.3–4.0)
Relative Index: INVALID (ref 0.0–2.5)
Relative Index: INVALID (ref 0.0–2.5)
Total CK: 87 U/L (ref 7–232)
Total CK: 93 U/L (ref 7–232)

## 2013-06-06 LAB — TROPONIN I
Troponin I: <0.3 ng/mL (ref <0.30)
Troponin I: <0.3 ng/mL (ref <0.30)
Troponin I: <0.3 ng/mL (ref <0.30)

## 2013-06-06 LAB — COMPREHENSIVE METABOLIC PANEL
ALT: 16 U/L (ref 0–53)
AST: 21 U/L (ref 0–37)
Calcium: 8.9 mg/dL (ref 8.4–10.5)
Creatinine, Ser: 2.24 mg/dL — ABNORMAL HIGH (ref 0.50–1.35)
GFR calc non Af Amer: 27 mL/min — ABNORMAL LOW (ref >90)
Glucose, Bld: 166 mg/dL — ABNORMAL HIGH (ref 70–99)
Sodium: 138 mEq/L (ref 135–145)
Total Protein: 6 g/dL (ref 6.0–8.3)

## 2013-06-06 LAB — GLUCOSE, CAPILLARY: Glucose-Capillary: 141 mg/dL — ABNORMAL HIGH (ref 70–99)

## 2013-06-06 LAB — CBC
HCT: 26.9 % — ABNORMAL LOW (ref 39.0–52.0)
Hemoglobin: 8.9 g/dL — ABNORMAL LOW (ref 13.0–17.0)
MCH: 32.4 pg (ref 26.0–34.0)
MCHC: 33.1 g/dL (ref 30.0–36.0)
RBC: 2.75 MIL/uL — ABNORMAL LOW (ref 4.22–5.81)

## 2013-06-06 MED ORDER — POTASSIUM CHLORIDE CRYS ER 20 MEQ PO TBCR
40.0000 meq | EXTENDED_RELEASE_TABLET | Freq: Two times a day (BID) | ORAL | Status: AC
  Administered 2013-06-06 (×2): 40 meq via ORAL
  Filled 2013-06-06: qty 2

## 2013-06-06 MED ORDER — ISOSORBIDE MONONITRATE ER 60 MG PO TB24
120.0000 mg | ORAL_TABLET | Freq: Every day | ORAL | Status: DC
  Administered 2013-06-06 – 2013-06-14 (×9): 120 mg via ORAL
  Filled 2013-06-06 (×9): qty 2

## 2013-06-06 MED ORDER — HYDRALAZINE HCL 25 MG PO TABS
25.0000 mg | ORAL_TABLET | Freq: Three times a day (TID) | ORAL | Status: DC
Start: 2013-06-06 — End: 2013-06-07
  Administered 2013-06-06 – 2013-06-07 (×3): 25 mg via ORAL
  Filled 2013-06-06 (×6): qty 1

## 2013-06-06 MED ORDER — METOPROLOL TARTRATE 50 MG PO TABS
50.0000 mg | ORAL_TABLET | Freq: Two times a day (BID) | ORAL | Status: DC
  Administered 2013-06-06 – 2013-06-07 (×4): 50 mg via ORAL
  Filled 2013-06-06 (×4): qty 1

## 2013-06-06 MED ORDER — NITROGLYCERIN IN D5W 200-5 MCG/ML-% IV SOLN
2.0000 ug/min | INTRAVENOUS | Status: DC
  Administered 2013-06-06: 170 ug/min via INTRAVENOUS
  Administered 2013-06-07: 70 ug/min via INTRAVENOUS
  Filled 2013-06-06 (×4): qty 250

## 2013-06-06 NOTE — Plan of Care (Signed)
Problem: Phase I Progression Outcomes Goal: Hemodynamically stable Outcome: Not Progressing BP staying 170's despite slowing increasing NGT gtt.

## 2013-06-06 NOTE — Progress Notes (Signed)
       Patient Name: [name] LIGHTSEY Sr. Date of Encounter: 06/06/2013    SUBJECTIVE: Breathing is improved since admission. Blood pressure has been difficult to control. He has had a near 5 L diuresis since admission. He had not experienced peripheral edema. He denies anginal quality chest pain.  TELEMETRY:  Normal sinus rhythm/sinus bradycardia Filed Vitals:   06/06/13 0630 06/06/13 0645 06/06/13 0700 06/06/13 0804  BP: 172/63 168/79 154/68   Pulse: 71 75 72   Temp:    99 F (37.2 C)  TempSrc:    Oral  Resp: 11 22 10    Height:      Weight:      SpO2: 99% 97% 97%     Intake/Output Summary (Last 24 hours) at 06/06/13 0850 Last data filed at 06/06/13 0700  Gross per 24 hour  Intake 1413.85 ml  Output   3325 ml  Net -1911.15 ml    LABS: Basic Metabolic Panel:  Recent Labs  40/98/11 1700 06/06/13 0225  NA 142 138  K 3.2* 3.1*  CL 100 99  CO2 28 27  GLUCOSE 186* 166*  BUN 26* 28*  CREATININE 2.28* 2.24*  CALCIUM 9.1 8.9   CBC:  Recent Labs  06/04/13 1849 06/05/13 0730 06/06/13 0225  WBC 8.9 10.4 11.1*  NEUTROABS 6.0  --   --   HGB 10.6* 10.1* 8.9*  HCT 32.8* 31.3* 26.9*  MCV 99.1 98.4 97.8  PLT 124* 116* 116*   Cardiac Enzymes:  Recent Labs  06/05/13 1459 06/05/13 1930 06/06/13 0225  CKTOTAL 107 110 91  CKMB 2.4 2.5 1.9  TROPONINI <0.30 <0.30 <0.30    BNP (last 3 results)  Recent Labs  03/16/13 1848 06/04/13 1849  PROBNP 5934.0* 24561.0*    Radiology/Studies:  no acute abnormality on chest x-ray  Physical Exam: Blood pressure 154/68, pulse 72, temperature 99 F (37.2 C), temperature source Oral, resp. rate 10, height 5\' 10"  (1.778 m), weight 171 lb 1.2 oz (77.6 kg), SpO2 97.00%. Weight change:    S4 gallop on auscultation  Mild JVD with the patient lying at 30 Faint abdominal bruits Edema is absent Intact neurological exam  ASSESSMENT:   1. Hypertensive urgency now improved on high dose IV nitroglycerin. With significant  acceleration in blood pressure severely and difficulty with control, suspect renal artery stenosis may be at play. Last abdominal CT scan with contrast could not great severity of either renal artery due to heavy calcification.  2. Acute on chronic diastolic heart failure/flash pulmonary edema, likely related to #1 above/bilateral renal artery stenosis.  3. Chronic anemia, with component related to CAD  4. Stage IV CKD complicating hypertension and clinical management  5. Severe peripheral arterial disease including iliac/femoral, and mesenteric/celiac disease. Renal artery disease also noted but never quantitated.   6. 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 Rule out hyper-renin/hyper-Aldo state related to renal artery stenosis.  Plan:  1. Continue aggressive measures that blood pressure control. I will significantly increase her hydralazine dose.  2. I will avoid ARB/ACE therapy due to renal dysfunction   3. We'll get Dr. [name] involved with reference to the question of bilateral significant renal artery stenosis. Perhaps a renal duplex will give [place] noninvasive information that may help to quantitate the severity of renal artery stenosis.  Selinda Eon 06/06/2013, 8:50 AM

## 2013-06-06 NOTE — Progress Notes (Signed)
Pressures are improving with more aggressive therapy. Watch renal function and decrease the diuretic regimen as needed. Spoke with Dr. [name] today and he will see early Friday morning for consideration of CO2 angio and possible renal stents.

## 2013-06-06 NOTE — Care Management Note (Addendum)
    Page 1 of 2   06/11/2013     1:50:46 PM   CARE MANAGEMENT NOTE 06/11/2013  Patient:  [name], [name]   Account Number:  [id number]  Date Initiated:  06/05/2013  Documentation initiated by:  [facility]  Subjective/Objective Assessment:   Admitted with Hypertension and CHF     Action/Plan:   Anticipated DC Date:  06/08/2013   Anticipated DC Plan:  HOME W HOME HEALTH SERVICES      DC Planning Services  CM consult      Choice offered to / List presented to:          [facility] arranged  HH-1 RN  HH-10 DISEASE MANAGEMENT  HH-2 PT      [facility] agency  [facility]   Status of service:  In process, will continue to follow Medicare Important Message given?   (If response is "NO", the following Medicare IM given date fields will be blank) Date Medicare IM given:   Date Additional Medicare IM given:    Discharge Disposition:    Per UR Regulation:  Reviewed for med. necessity/level of care/duration of stay  If discussed at Long Length of Stay Meetings, dates discussed:    Comments:  [place], [place] [phone]                 Rogers,Theresa Daughter [phone]                 Tyan, Dy [phone]                 Ronney Asters Daughter [phone]  06/11/13 1130 Oletta Cohn, RN, BSN, [place] [phone] CM spoke with patient concerning discharge planning. Pt offered choice for [facility] for [facility] services upon discharge. Per pt choice [facility] to provide [facility] services.  [facility] rep Hilda Lias contacted concerning new referral. Pt to discharge home alone. Pt request [facility]HRN for disease management upon discharge. No DME needs stated.  06-08-13 8:30am Avie Arenas, [phone] On IV NTG for continued need for BP control.  ?? renal artery issue - [facility] consulted.  06/06/13 1112 Henrietta Mayo RN MSN BSN CCM Received referral to talk with pt re his difficulty paying for medications.  Per pt, there are no particular meds - some months he doesn't have enough  money for copay. 1459 Clonidine, furosemide, and metoprolol are available @ [facility] for $4/30 day supply, $10/90 day supply. Amiodarone, amlodipine, and tamsulosin are available from [facility] for $20-$30 for 90 day supply, $40-55 for 180 day supply.  Pt sleeping, provided [facility] application and information to RN who will give to pt and/or family.

## 2013-06-06 NOTE — Progress Notes (Signed)
MD [name] notified of pt BP 194/82, max Nitro infusing. PO med doses changed. Will cont to monitor pt. [name]

## 2013-06-07 DIAGNOSIS — I10 Essential (primary) hypertension: Secondary | ICD-10-CM

## 2013-06-07 LAB — CBC
HCT: 28 % — ABNORMAL LOW (ref 39.0–52.0)
Hemoglobin: 9.1 g/dL — ABNORMAL LOW (ref 13.0–17.0)
MCH: 31.8 pg (ref 26.0–34.0)
MCHC: 32.5 g/dL (ref 30.0–36.0)
Platelets: 113 10*3/uL — ABNORMAL LOW (ref 150–400)
RBC: 2.86 MIL/uL — ABNORMAL LOW (ref 4.22–5.81)
RDW: 17.7 % — ABNORMAL HIGH (ref 11.5–15.5)

## 2013-06-07 LAB — BASIC METABOLIC PANEL
Calcium: 8.3 mg/dL — ABNORMAL LOW (ref 8.4–10.5)
Creatinine, Ser: 2.11 mg/dL — ABNORMAL HIGH (ref 0.50–1.35)
GFR calc Af Amer: 34 mL/min — ABNORMAL LOW (ref >90)
GFR calc non Af Amer: 29 mL/min — ABNORMAL LOW (ref >90)
Glucose, Bld: 131 mg/dL — ABNORMAL HIGH (ref 70–99)
Potassium: 3.3 mEq/L — ABNORMAL LOW (ref 3.5–5.1)
Sodium: 141 mEq/L (ref 135–145)

## 2013-06-07 LAB — CK TOTAL AND CKMB (NOT AT ARMC)
CK, MB: 1.6 ng/mL (ref 0.3–4.0)
Relative Index: INVALID (ref 0.0–2.5)

## 2013-06-07 LAB — TROPONIN I: Troponin I: <0.3 ng/mL (ref <0.30)

## 2013-06-07 MED ORDER — CLONIDINE HCL 0.2 MG PO TABS
0.2000 mg | ORAL_TABLET | Freq: Three times a day (TID) | ORAL | Status: DC
  Administered 2013-06-08 (×3): 0.2 mg via ORAL
  Filled 2013-06-07 (×6): qty 1

## 2013-06-07 MED ORDER — POTASSIUM CHLORIDE CRYS ER 20 MEQ PO TBCR
40.0000 meq | EXTENDED_RELEASE_TABLET | Freq: Three times a day (TID) | ORAL | Status: DC
  Administered 2013-06-07 – 2013-06-08 (×6): 40 meq via ORAL
  Filled 2013-06-07 (×9): qty 2

## 2013-06-07 MED ORDER — CARVEDILOL 25 MG PO TABS
25.0000 mg | ORAL_TABLET | Freq: Two times a day (BID) | ORAL | Status: DC
  Administered 2013-06-08 – 2013-06-14 (×13): 25 mg via ORAL
  Filled 2013-06-07 (×15): qty 1

## 2013-06-07 MED ORDER — CLONIDINE HCL 0.1 MG PO TABS
0.1000 mg | ORAL_TABLET | Freq: Once | ORAL | Status: AC
  Administered 2013-06-08: 0.1 mg via ORAL
  Filled 2013-06-07: qty 1

## 2013-06-07 MED ORDER — LABETALOL HCL 5 MG/ML IV SOLN
20.0000 mg | Freq: Once | INTRAVENOUS | Status: AC
  Administered 2013-06-07: 20 mg via INTRAVENOUS

## 2013-06-07 MED ORDER — LABETALOL HCL 5 MG/ML IV SOLN
INTRAVENOUS | Status: AC
  Filled 2013-06-07: qty 4

## 2013-06-07 MED ORDER — ACETAMINOPHEN 500 MG PO TABS
1000.0000 mg | ORAL_TABLET | ORAL | Status: DC | PRN
  Administered 2013-06-07 – 2013-06-11 (×5): 1000 mg via ORAL
  Filled 2013-06-07 (×4): qty 2

## 2013-06-07 MED ORDER — HYDRALAZINE HCL 50 MG PO TABS
50.0000 mg | ORAL_TABLET | Freq: Three times a day (TID) | ORAL | Status: DC
  Administered 2013-06-07 – 2013-06-14 (×22): 50 mg via ORAL
  Filled 2013-06-07 (×26): qty 1

## 2013-06-07 NOTE — Progress Notes (Signed)
  Called by nurse due to patient having headache.  Currently on 100 mcg/min NTG.  SBP still in 170-180 range. EF normal. Renal u/s no RAS.  Will give labetalol 20 mg IV x 1 now. Will try to wean IV NTG down a bit. Treat HA with tylenol.  Increase clonidine to 0.2 tid. Change lopressor to carvedilol 25 bid  Truman Hayward 9:36 PM

## 2013-06-07 NOTE — Consult Note (Signed)
Consult Note  Patient name: [name]. MRN: [id number] DOB: [birth year] Sex: male  Consulting Physician:  Dr. [name]  Reason for Consult:  Chief Complaint  Patient presents with  . Chest Pain  . 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  He has CAD, s/p CABG x2 in 1994 and 2014.  He has chronic renal insufficiency with Cr in the high 1 range.  He was admitted with swelling and SOB with marked HTN.  His blood pressure has been hard to control with IV meds, and he has had an elevation in his Cr to above 2.  This raised concerns of renal artery stenosis.  I have been asked to evaluate this.  Past Medical History  Diagnosis Date  . Diabetes mellitus   . CAD (coronary artery disease)     s/b CABG '94  . Hypertension   . Anemia     Macrocytic  . Peripheral vascular disease   . Hyperlipidemia   . Cancer     prostate  . Ulcer   . GERD (gastroesophageal reflux disease)   . Liddle's syndrome   . Hemorrhoids   . Intestinal ischemia   . DVT (deep venous thrombosis)   . Atrial fibrillation     Paroxysmal. S/p PPM  . CHF (congestive heart failure)   . Prostate cancer 1997    XRT and lupron  . Anemia, unspecified 04/24/2013  . Thrombocytopenia, unspecified 04/24/2013  . MDS (myelodysplastic syndrome) 05/22/2013    Past Surgical History  Procedure Laterality Date  . Hiatal hernia repair      and ulcer repair  . Appendectomy    . Pacemaker insertion    . Coronary artery bypass graft  01/22/1993  . Cholecystectomy  Oct 2009    Gall Bladder  . Coronary artery bypass graft  01/03/2012    Procedure: REDO CORONARY ARTERY BYPASS GRAFTING (CABG);  Surgeon: Alleen Borne, MD;  Location: [facility] OR;  Service: Open Heart Surgery;  Laterality: N/A;  Redo CABG x  using bilateral internal mammary arteries;  left leg greater saphenous vein harvested endoscopically  .  Celiac artery angioplasty  05-16-12    and stenting    History   Social History  . Marital Status: Married    Spouse Name: N/A    Number of Children: N/A  . Years of Education: N/A   Occupational History  . Not on file.   Social History Main Topics  . Smoking status: Former Smoker    Types: Cigarettes    Quit date: 07/12/1977  . Smokeless tobacco: Never Used  . Alcohol Use: No  . Drug Use: No  . Sexual Activity: Yes   Other Topics Concern  . Not on file   Social History Narrative  . No narrative on file    Family History  Problem Relation Age of Onset  . Diabetes Mother   . Heart disease Mother     Heart Disease before age 28  . Hyperlipidemia Mother   . Hypertension Mother   . Cancer Father     stomach/liver  . Cancer Sister     Breast cancer  . Diabetes Sister   . Heart disease Daughter     Heart Disease before age 46  . Hypertension Daughter   . Heart attack Daughter     Allergies as  of 06/04/2013 - Review Complete 06/04/2013  Allergen Reaction Noted  . Ace inhibitors Cough 05/12/2011  . Ibuprofen Other (See Comments) 05/12/2011    No current facility-administered medications on file prior to encounter.   Current Outpatient Prescriptions on File Prior to Encounter  Medication Sig Dispense Refill  . acetaminophen (ARTHRITIS PAIN RELIEF) 650 MG CR tablet Take 1,300 mg by mouth 2 (two) times daily.       Marland Kitchen amiodarone (PACERONE) 200 MG tablet Take 100 mg by mouth daily.      Marland Kitchen aspirin 325 MG tablet Take 325 mg by mouth daily.      Marland Kitchen atorvastatin (LIPITOR) 80 MG tablet Take 80 mg by mouth daily.      Marland Kitchen CALCIUM PO Take 2,000 mg by mouth daily.       . cholecalciferol (VITAMIN D) 1000 UNITS tablet Take 1,000 Units by mouth daily.       . cloNIDine (CATAPRES) 0.2 MG tablet Take 0.2 mg by mouth 2 (two) times daily.       . clopidogrel (PLAVIX) 75 MG tablet Take 75 mg by mouth daily.      . Cyanocobalamin (VITAMIN B-12 IJ) Inject as directed.      .  furosemide (LASIX) 80 MG tablet Take 80 mg by mouth daily.       Marland Kitchen glucose blood test strip       . pantoprazole (PROTONIX) 40 MG tablet Take 40 mg by mouth daily.      . potassium chloride SA (K-DUR,KLOR-CON) 20 MEQ tablet Take 20-40 mEq by mouth 2 (two) times daily. 20 meq in the evening and 40 meq in the a.m.      Marland Kitchen silodosin (RAPAFLO) 8 MG CAPS capsule Take 8 mg by mouth daily.          REVIEW OF SYSTEMS: Please see H&P, no changes  PHYSICAL EXAMINATION: General: The patient appears their stated age.  Vital signs are BP 157/58  Pulse 71  Temp(Src) 98.1 F (36.7 C) (Oral)  Resp 14  Ht 5\' 10"  (1.778 m)  Wt 171 lb 1.2 oz (77.6 kg)  BMI 24.55 kg/m2  SpO2 100% Pulmonary: Respirations are non-labored HEENT:  No gross abnormalities Abdomen: Soft and non-tender  Musculoskeletal: There are no major deformities.   Neurologic: No focal weakness or paresthesias are detected, Skin: There are no ulcer or rashes noted. Psychiatric: The patient has normal affect. Cardiovascular: There is a regular rate and rhythm   Diagnostic Studies: Duplex ultrasound of his kidneys shows no renal artery stenosis   Assessment:  Hypertensive urgency Plan: I have reviewed his angiogram from August of this year.  There was approximately 30-40% left renal artery stenosis identified.  A pressure gradient was checked across this lesion and found that this was NOT significant.  The right renal artery was widely patent.  With ultrasound not identifying significant renal artery stenosis, and with the angiogram findings from August of this year, I suspect that renal artery stenosis is not the underlying problem.  I discussed this with the patient, his family, ane Dr Mayford Knife.  I do not feel that proceeding with angiography will add anything at this point.  I would continue with medical management of his hypertension.  In addition, a repeat angiogram would require the use of contrast, although minimal would be required.   I would not recommend exposing him to this risk with his recent rise in Cr.  Please contact me with any other questions or concerns.  Eldridge Abrahams, M.D. [facility] Office: [phone] Pager:  [phone]

## 2013-06-07 NOTE — Progress Notes (Signed)
Called [facility] MD on call (Bensimhon) about patient having a headache (5/10), patient did not have any PRN pain meds ordered.  Updated MD on patient's SBP being the 170-180s, currently patient is on 100 mcg of NITRO.   Received orders to give Labetalol 20mg  IV now, decrease Nitro drip as patient tolerates, MD also changed beta-blockers and clonidine orders.   Will administer labetalol, APAP, and will wean nitro as needed. Patient is stable overall. Making adequate urine

## 2013-06-07 NOTE — Progress Notes (Signed)
SUBJECTIVE:  No compliants this am  OBJECTIVE:   Vitals:   Filed Vitals:   06/07/13 0715 06/07/13 0730 06/07/13 0745 06/07/13 0756  BP: 170/81 178/80 178/74   Pulse: 65 72 71   Temp:    98.7 F (37.1 C)  TempSrc:    Oral  Resp: 14 10 8    Height:      Weight:      SpO2: 100% 100% 100%    I&O's:   Intake/Output Summary (Last 24 hours) at 06/07/13 6213 Last data filed at 06/07/13 0745  Gross per 24 hour  Intake 1382.5 ml  Output   3350 ml  Net -1967.5 ml   TELEMETRY: Reviewed telemetry pt in NSR:     PHYSICAL EXAM General: Well developed, well nourished, in no acute distress Head: Eyes PERRLA, No xanthomas.   Normal cephalic and atramatic  Lungs:   Clear bilaterally to auscultation and percussion. Heart:   HRRR S1 S2 Pulses are 2+ & equal. Abdomen: Bowel sounds are positive, abdomen soft and non-tender without masses Extremities:   No clubbing, cyanosis or edema.  DP +1 Neuro: Alert and oriented X 3. Psych:  Good affect, responds appropriately   LABS: Basic Metabolic Panel:  Recent Labs  08/65/78 0225 06/07/13 0450  NA 138 141  K 3.1* 3.3*  CL 99 101  CO2 27 28  GLUCOSE 166* 131*  BUN 28* 30*  CREATININE 2.24* 2.11*  CALCIUM 8.9 8.3*   Liver Function Tests:  Recent Labs  06/05/13 1700 06/06/13 0225  AST 21 21  ALT 19 16  ALKPHOS 72 63  BILITOT 0.4 0.3  PROT 6.7 6.0  ALBUMIN 3.2* 3.0*   No results found for this basename: LIPASE, AMYLASE,  in the last 72 hours CBC:  Recent Labs  06/04/13 1849  06/06/13 0225 06/07/13 0450  WBC 8.9  < > 11.1* 11.4*  NEUTROABS 6.0  --   --   --   HGB 10.6*  < > 8.9* 9.1*  HCT 32.8*  < > 26.9* 28.0*  MCV 99.1  < > 97.8 97.9  PLT 124*  < > 116* 113*  < > = values in this interval not displayed. Cardiac Enzymes:  Recent Labs  06/06/13 1715 06/06/13 2250 06/07/13 0450  CKTOTAL 93 84 76  CKMB 1.8 1.6 1.6  TROPONINI <0.30 <0.30 <0.30   BNP: No components found with this basename: POCBNP,   D-Dimer: No results found for this basename: DDIMER,  in the last 72 hours Hemoglobin A1C: No results found for this basename: HGBA1C,  in the last 72 hours Fasting Lipid Panel: No results found for this basename: CHOL, HDL, LDLCALC, TRIG, CHOLHDL, LDLDIRECT,  in the last 72 hours Thyroid Function Tests: No results found for this basename: TSH, T4TOTAL, FREET3, T3FREE, THYROIDAB,  in the last 72 hours Anemia Panel: No results found for this basename: VITAMINB12, FOLATE, FERRITIN, TIBC, IRON, RETICCTPCT,  in the last 72 hours Coag Panel:   Lab Results  Component Value Date   INR 1.06 03/17/2013   INR 1.31 01/03/2012   INR 1.04 01/02/2012    RADIOLOGY: Dg Chest Portable 1 View  06/04/2013   CLINICAL DATA:  Chest pain  EXAM: PORTABLE CHEST - 1 VIEW  COMPARISON:  02/01/2012  FINDINGS: Cardiac shadow is mildly enlarged. A 2 lead pacing device is again seen and stable. Lungs demonstrate mild chronic changes. Postsurgical changes are again seen. No acute focal abnormality is noted.  IMPRESSION: No acute abnormality seen.  Electronically Signed   By: Alcide Clever M.D.   On: 06/04/2013 18:54    ASSESSMENT:  1. Hypertensive urgency now improved but still high on high dose IV nitroglycerin. With significant acceleration in blood pressure severely and difficulty with control, suspect renal artery stenosis may be at play. Last abdominal CT scan with contrast could not great severity of either renal artery due to heavy calcification.  2. Acute on chronic diastolic heart failure/flash pulmonary edema, likely related to #1 above/bilateral renal artery stenosis.  3. Chronic anemia, with component related to CKD  4. Stage IV CKD complicating hypertension and clinical management  5. Severe peripheral arterial disease including iliac/femoral, and mesenteric/celiac disease. Renal artery disease also noted but never quantitated.  6. 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 Rule out  hyper-renin/hyper-Aldo state related to renal artery stenosis.  Plan:  1. Continue aggressive measures that blood pressure control. I will his hydralazine dose to 50mg  q8 hours today and continue on IV NTG/Imdur/amlodipine/catapress 2. I will avoid ARB/ACE therapy due to renal dysfunction  3. We'll get Dr. [name] involved with reference to the question of bilateral significant renal artery stenosis. Perhaps a renal duplex will give [place] noninvasive information that may help to quantitate the severity of renal artery stenosis. I have made him NPO after midnight tonight for possible invasive study by Dr. [name] in am 4.  Replete potassium and start Kdur TID - he had been on same dose BID with no improvement in hypokalemia.  Check BMET in am.      Quintella Reichert, MD  06/07/2013  8:22 AM

## 2013-06-07 NOTE — Progress Notes (Signed)
Bilateral renal artery duplex completed.  No obvious evidence of renal artery stenosis. 

## 2013-06-08 ENCOUNTER — Encounter (HOSPITAL_COMMUNITY)
Admission: EM | Disposition: A | Discharge status: Home / Self Care | Payer: Self-pay | Source: Home / Self Care | Attending: Interventional Cardiology

## 2013-06-08 ENCOUNTER — Ambulatory Visit: Payer: Medicare Other

## 2013-06-08 ENCOUNTER — Other Ambulatory Visit: Payer: Medicare Other | Admitting: Lab

## 2013-06-08 DIAGNOSIS — I701 Atherosclerosis of renal artery: Secondary | ICD-10-CM

## 2013-06-08 LAB — CBC
Hemoglobin: 9.8 g/dL — ABNORMAL LOW (ref 13.0–17.0)
MCH: 32.3 pg (ref 26.0–34.0)
Platelets: 113 10*3/uL — ABNORMAL LOW (ref 150–400)
RBC: 3.03 MIL/uL — ABNORMAL LOW (ref 4.22–5.81)
WBC: 10.1 10*3/uL (ref 4.0–10.5)

## 2013-06-08 LAB — BASIC METABOLIC PANEL
CO2: 27 mEq/L (ref 19–32)
Calcium: 8.5 mg/dL (ref 8.4–10.5)
Chloride: 102 mEq/L (ref 96–112)
Creatinine, Ser: 2.42 mg/dL — ABNORMAL HIGH (ref 0.50–1.35)
Glucose, Bld: 130 mg/dL — ABNORMAL HIGH (ref 70–99)
Potassium: 3.9 mEq/L (ref 3.5–5.1)
Sodium: 139 mEq/L (ref 135–145)

## 2013-06-08 LAB — GLUCOSE, CAPILLARY: Glucose-Capillary: 176 mg/dL — ABNORMAL HIGH (ref 70–99)

## 2013-06-08 SURGERY — ABDOMINAL AORTAGRAM
Anesthesia: LOCAL

## 2013-06-08 MED ORDER — FUROSEMIDE 40 MG PO TABS
40.0000 mg | ORAL_TABLET | Freq: Every day | ORAL | Status: DC
  Filled 2013-06-08: qty 1

## 2013-06-08 MED ORDER — FUROSEMIDE 10 MG/ML IJ SOLN: 80.0000 mg | Freq: Every day | INTRAMUSCULAR | Status: DC

## 2013-06-08 NOTE — Evaluation (Signed)
Physical Therapy Evaluation Patient Details Name: [name] HENSCH Sr. MRN: [id number] DOB: 09-Apr-1936 Today's Date: 06/08/2013 Time: 4098-1191 PT Time Calculation (min): 24 min  PT Assessment / Plan / Recommendation History of Present Illness  He went to his PCP's office today and they noted the increased weight gain, leg swelling, shortness of breath and also were concerned for bradycardia long with reports of a "new murmur" on their physical exam.  For these reasons, the patient was sent to the [facility] ED.  Upon arrival to our ED the patient was markedly hypertensive 245/100 mmHg and c/o SOB  Clinical Impression  Patient ambulates with increased trunk flexion and reported fatigue and pain in bilateral LE. HR and SpO2 both stable with ambulation with RW at a velocity reported to be much slower than his baseline due to fatigue. He can benefit from continued PT services both acutely and from home health therapy upon d/c/to address these and deficits noted below in order to increase independence and improve mobility.        PT Assessment  Patient needs continued PT services    Follow Up Recommendations  Home health PT;Supervision for mobility/OOB    Does the patient have the potential to tolerate intense rehabilitation      Barriers to Discharge        Equipment Recommendations  None recommended by PT    Recommendations for Other Services     Frequency Min 3X/week    Precautions / Restrictions Precautions Precautions: Fall Restrictions Weight Bearing Restrictions: No   Pertinent Vitals/Pain SpO2 97%RA at rest, 96% on RA after ambulation; HR 74 during, 70 after ambulation; reports 6/10 pain in bilateral hips and ankles throughout session;      Mobility  Bed Mobility Bed Mobility: Not assessed (pt seat EOB on arrival) Transfers Transfers: Sit to Stand;Stand to Sit Sit to Stand: 4: Min assist;With upper extremity assist;From bed Stand to Sit: 4: Min assist;To chair/3-in-1;With  upper extremity assist Details for Transfer Assistance: cues for safe hand placement, anterior translation and trunk extension Ambulation/Gait Ambulation/Gait Assistance: 4: Min assist Ambulation Distance (Feet): 100 Feet Assistive device: Rolling walker Ambulation/Gait Assistance Details: cues for trunk extension; assist for safety Gait Pattern: Step-through pattern;Decreased stride length;Trunk flexed Gait velocity: decreased General Gait Details: pt ambulated with trunk flexed, looking down, very slow velocity; c/o ankle/hip pain Stairs: No    Exercises     PT Diagnosis: Generalized weakness;Difficulty walking  PT Problem List: Decreased strength;Decreased activity tolerance;Decreased mobility PT Treatment Interventions: Gait training;Stair training;Functional mobility training;Therapeutic activities;Therapeutic exercise     PT Goals(Current goals can be found in the care plan section) Acute Rehab PT Goals Patient Stated Goal: to get back to prior status; back to gardening PT Goal Formulation: With patient Time For Goal Achievement: 06/22/13 Potential to Achieve Goals: Good  Visit Information  Last PT Received On: 06/08/13 Assistance Needed: +1 History of Present Illness: He went to his PCP's office today and they noted the increased weight gain, leg swelling, shortness of breath and also were concerned for bradycardia long with reports of a "new murmur" on their physical exam.  For these reasons, the patient was sent to the [facility] ED.  Upon arrival to our ED the patient was markedly hypertensive 245/100 mmHg and c/o SOB       Prior Functioning  Home Living Family/patient expects to be discharged to:: Private residence Living Arrangements: Spouse/significant other;Children Available Help at Discharge: Family Type of Home: House Home Access: Level entry Home Layout:  Two level;Bed/bath upstairs Alternate Level Stairs-Number of Steps: 13 Alternate Level Stairs-Rails: Right Home  Equipment: Walker - 2 wheels;Cane - single point Prior Function Level of Independence: Independent with assistive device(s) Comments: uses cane and RW sporadically Communication Communication: No difficulties    Cognition  Cognition Arousal/Alertness: Lethargic Behavior During Therapy: WFL for tasks assessed/performed Overall Cognitive Status: Within Functional Limits for tasks assessed    Extremity/Trunk Assessment Upper Extremity Assessment Upper Extremity Assessment: Overall WFL for tasks assessed Lower Extremity Assessment Lower Extremity Assessment: Generalized weakness Cervical / Trunk Assessment Cervical / Trunk Assessment: Normal   Balance    End of Session PT - End of Session Equipment Utilized During Treatment: Gait belt Activity Tolerance: Patient limited by fatigue Patient left: in chair;with call bell/phone within reach;with family/visitor present Nurse Communication: Mobility status  GP     Willette Pa, SPT 06/08/2013, 2:37 PM

## 2013-06-08 NOTE — Plan of Care (Signed)
Problem: Phase I Progression Outcomes Goal: EF % per last Echo/documented,Core Reminder form on chart Outcome: Completed/Met Date Met:  06/08/13 65-70%

## 2013-06-08 NOTE — Progress Notes (Addendum)
SUBJECTIVE:  No compliants this am  OBJECTIVE:   Vitals:   Filed Vitals:   06/08/13 0800 06/08/13 0830 06/08/13 0900 06/08/13 0930  BP: 144/57 152/60 171/85 142/64  Pulse: 64 69 72 73  Temp:      TempSrc:      Resp: 16 17 14 16   Height:      Weight:      SpO2: 100% 99% 100% 100%   I&O's:   Intake/Output Summary (Last 24 hours) at 06/08/13 0947 Last data filed at 06/08/13 0900  Gross per 24 hour  Intake 560.89 ml  Output   2650 ml  Net -2089.11 ml   TELEMETRY: Reviewed telemetry pt in NSR:     PHYSICAL EXAM General: Well developed, well nourished, in no acute distress Head: Eyes PERRLA, No xanthomas.   Normal cephalic and atramatic  Lungs:   Clear bilaterally to auscultation and percussion. Heart:   HRRR S1 S2 Pulses are 2+ & equal. Abdomen: Bowel sounds are positive, abdomen soft and non-tender without masses Extremities:   No clubbing, cyanosis or edema.  DP +1 Neuro: Alert and oriented X 3. Psych:  Good affect, responds appropriately   LABS: Basic Metabolic Panel:  Recent Labs  08/65/78 0450 06/08/13 0358  NA 141 139  K 3.3* 3.9  CL 101 102  CO2 28 27  GLUCOSE 131* 130*  BUN 30* 38*  CREATININE 2.11* 2.42*  CALCIUM 8.3* 8.5   Liver Function Tests:  Recent Labs  06/05/13 1700 06/06/13 0225  AST 21 21  ALT 19 16  ALKPHOS 72 63  BILITOT 0.4 0.3  PROT 6.7 6.0  ALBUMIN 3.2* 3.0*   No results found for this basename: LIPASE, AMYLASE,  in the last 72 hours CBC:  Recent Labs  06/07/13 0450 06/08/13 0358  WBC 11.4* 10.1  HGB 9.1* 9.8*  HCT 28.0* 29.7*  MCV 97.9 98.0  PLT 113* 113*   Cardiac Enzymes:  Recent Labs  06/06/13 1715 06/06/13 2250 06/07/13 0450  CKTOTAL 93 84 76  CKMB 1.8 1.6 1.6  TROPONINI <0.30 <0.30 <0.30   BNP: No components found with this basename: POCBNP,  D-Dimer: No results found for this basename: DDIMER,  in the last 72 hours Hemoglobin A1C: No results found for this basename: HGBA1C,  in the last 72  hours Fasting Lipid Panel: No results found for this basename: CHOL, HDL, LDLCALC, TRIG, CHOLHDL, LDLDIRECT,  in the last 72 hours Thyroid Function Tests: No results found for this basename: TSH, T4TOTAL, FREET3, T3FREE, THYROIDAB,  in the last 72 hours Anemia Panel: No results found for this basename: VITAMINB12, FOLATE, FERRITIN, TIBC, IRON, RETICCTPCT,  in the last 72 hours Coag Panel:   Lab Results  Component Value Date   INR 1.06 03/17/2013   INR 1.31 01/03/2012   INR 1.04 01/02/2012    RADIOLOGY: Dg Chest Portable 1 View  06/04/2013   CLINICAL DATA:  Chest pain  EXAM: PORTABLE CHEST - 1 VIEW  COMPARISON:  02/01/2012  FINDINGS: Cardiac shadow is mildly enlarged. A 2 lead pacing device is again seen and stable. Lungs demonstrate mild chronic changes. Postsurgical changes are again seen. No acute focal abnormality is noted.  IMPRESSION: No acute abnormality seen.   Electronically Signed   By: Alcide Clever M.D.   On: 06/04/2013 18:54   ASSESSMENT:  1. Hypertensive urgency BP now much improved and now weaned off NTG.  Appreciate vascular input - RAS very unlikely given recent angiogram. Headache has resolved off NTG.  2. Acute on chronic diastolic heart failure/flash pulmonary edema, likely related to #1 above/bilateral renal artery stenosis.  3. Chronic anemia, with component related to CKD  4. Stage IV CKD complicating hypertension and clinical management  5. Severe peripheral arterial disease including iliac/femoral, and mesenteric/celiac disease. Renal artery disease also noted but never quantitated.  6. 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 Repleted Plan:  1. Continue Coreg/Imdur/amlodipine/catapress and Hydralazine 2. I will avoid ARB/ACE therapy due to renal dysfunction  3. PT consult since patient is fairly weak. 4.  Transfer to tele bed 5.  Decrease Lasix to 80mg  IV daily due to bump in creatinine.      Quintella Reichert, MD  06/08/2013  9:47 AM

## 2013-06-08 NOTE — Evaluation (Signed)
Seen and agree with SPT note Dartha Rozzell Tabor Tangela Dolliver, PT [phone]  

## 2013-06-08 NOTE — Progress Notes (Signed)
Pt rec from 2S, Pt O3x, no complaints of pain or sob. Pt on RA stat 97%. PT working with Pt at this time. Will continue to monitor

## 2013-06-08 NOTE — Progress Notes (Signed)
UR Completed.  Holdan Stucke Jane [phone] 06/08/2013  

## 2013-06-08 NOTE — Progress Notes (Signed)
Pt states he is having bilateral ankle pain, Rn removed Teds and scds. Will reassess to see if Pt receives relief of pain.

## 2013-06-08 NOTE — Progress Notes (Signed)
Agree with Emily Miliano RN BSN assessment and notes signing off   

## 2013-06-09 DIAGNOSIS — D638 Anemia in other chronic diseases classified elsewhere: Secondary | ICD-10-CM

## 2013-06-09 LAB — GLUCOSE, CAPILLARY
Glucose-Capillary: 129 mg/dL — ABNORMAL HIGH (ref 70–99)
Glucose-Capillary: 184 mg/dL — ABNORMAL HIGH (ref 70–99)
Glucose-Capillary: 223 mg/dL — ABNORMAL HIGH (ref 70–99)
Glucose-Capillary: 177 mg/dL — ABNORMAL HIGH (ref 70–99)

## 2013-06-09 LAB — BASIC METABOLIC PANEL
BUN: 51 mg/dL — ABNORMAL HIGH (ref 6–23)
BUN: 57 mg/dL — ABNORMAL HIGH (ref 6–23)
CO2: 26 mEq/L (ref 19–32)
CO2: 25 mEq/L (ref 19–32)
Calcium: 8.6 mg/dL (ref 8.4–10.5)
Calcium: 8.8 mg/dL (ref 8.4–10.5)
Chloride: 102 mEq/L (ref 96–112)
Chloride: 101 mEq/L (ref 96–112)
Creatinine, Ser: 2.81 mg/dL — ABNORMAL HIGH (ref 0.50–1.35)
Creatinine, Ser: 3.03 mg/dL — ABNORMAL HIGH (ref 0.50–1.35)
GFR calc Af Amer: 24 mL/min — ABNORMAL LOW (ref >90)
GFR calc Af Amer: 22 mL/min — ABNORMAL LOW (ref >90)
GFR calc non Af Amer: 21 mL/min — ABNORMAL LOW (ref >90)
GFR calc non Af Amer: 19 mL/min — ABNORMAL LOW (ref >90)
Glucose, Bld: 133 mg/dL — ABNORMAL HIGH (ref 70–99)
Glucose, Bld: 197 mg/dL — ABNORMAL HIGH (ref 70–99)
Potassium: 5.5 mEq/L — ABNORMAL HIGH (ref 3.5–5.1)
Potassium: 5.1 mEq/L (ref 3.5–5.1)
Sodium: 139 mEq/L (ref 135–145)
Sodium: 136 mEq/L (ref 135–145)

## 2013-06-09 LAB — CBC
Hemoglobin: 8.7 g/dL — ABNORMAL LOW (ref 13.0–17.0)
MCH: 32.8 pg (ref 26.0–34.0)
MCHC: 33 g/dL (ref 30.0–36.0)
MCV: 99.6 fL (ref 78.0–100.0)
RBC: 2.65 MIL/uL — ABNORMAL LOW (ref 4.22–5.81)
WBC: 8.6 10*3/uL (ref 4.0–10.5)

## 2013-06-09 MED ORDER — CLONIDINE HCL 0.2 MG PO TABS
0.2000 mg | ORAL_TABLET | Freq: Two times a day (BID) | ORAL | Status: DC
  Administered 2013-06-09 – 2013-06-14 (×11): 0.2 mg via ORAL
  Filled 2013-06-09 (×13): qty 1

## 2013-06-09 NOTE — Progress Notes (Signed)
SUBJECTIVE:  Complained of a little dizziness this am  OBJECTIVE:   Vitals:   Filed Vitals:   06/08/13 1531 06/08/13 1830 06/08/13 2130 06/09/13 0532  BP: 127/53 121/51 127/50 134/60  Pulse: 63 66 66 69  Temp:   98.7 F (37.1 C) 98.9 F (37.2 C)  TempSrc:   Oral Oral  Resp: 16 18 18 17   Height:      Weight:    164 lb 14.5 oz (74.8 kg)  SpO2: 98% 98% 99% 100%   I&O's:   Intake/Output Summary (Last 24 hours) at 06/09/13 0919 Last data filed at 06/09/13 0600  Gross per 24 hour  Intake    998 ml  Output   1051 ml  Net    -53 ml   TELEMETRY: Reviewed telemetry pt in NSR:     PHYSICAL EXAM General: Well developed, well nourished, in no acute distress Head: Eyes PERRLA, No xanthomas.   Normal cephalic and atramatic  Lungs:   Clear bilaterally to auscultation and percussion. Heart:   HRRR S1 S2 Pulses are 2+ & equal. Abdomen: Bowel sounds are positive, abdomen soft and non-tender without masses  Extremities:   No clubbing, cyanosis or edema.  DP +1 Neuro: Alert and oriented X 3. Psych:  Good affect, responds appropriately   LABS: Basic Metabolic Panel:  Recent Labs  95/28/41 0358 06/09/13 0420  NA 139 139  K 3.9 5.5*  CL 102 102  CO2 27 26  GLUCOSE 130* 133*  BUN 38* 51*  CREATININE 2.42* 2.81*  CALCIUM 8.5 8.6   Liver Function Tests: No results found for this basename: AST, ALT, ALKPHOS, BILITOT, PROT, ALBUMIN,  in the last 72 hours No results found for this basename: LIPASE, AMYLASE,  in the last 72 hours CBC:  Recent Labs  06/08/13 0358 06/09/13 0420  WBC 10.1 8.6  HGB 9.8* 8.7*  HCT 29.7* 26.4*  MCV 98.0 99.6  PLT 113* 122*   Cardiac Enzymes:  Recent Labs  06/06/13 1715 06/06/13 2250 06/07/13 0450  CKTOTAL 93 84 76  CKMB 1.8 1.6 1.6  TROPONINI <0.30 <0.30 <0.30   BNP: No components found with this basename: POCBNP,  D-Dimer: No results found for this basename: DDIMER,  in the last 72 hours Hemoglobin A1C: No results found for this  basename: HGBA1C,  in the last 72 hours Fasting Lipid Panel: No results found for this basename: CHOL, HDL, LDLCALC, TRIG, CHOLHDL, LDLDIRECT,  in the last 72 hours Thyroid Function Tests: No results found for this basename: TSH, T4TOTAL, FREET3, T3FREE, THYROIDAB,  in the last 72 hours Anemia Panel: No results found for this basename: VITAMINB12, FOLATE, FERRITIN, TIBC, IRON, RETICCTPCT,  in the last 72 hours Coag Panel:   Lab Results  Component Value Date   INR 1.06 03/17/2013   INR 1.31 01/03/2012   INR 1.04 01/02/2012    RADIOLOGY: Dg Chest Portable 1 View  06/04/2013   CLINICAL DATA:  Chest pain  EXAM: PORTABLE CHEST - 1 VIEW  COMPARISON:  02/01/2012  FINDINGS: Cardiac shadow is mildly enlarged. A 2 lead pacing device is again seen and stable. Lungs demonstrate mild chronic changes. Postsurgical changes are again seen. No acute focal abnormality is noted.  IMPRESSION: No acute abnormality seen.   Electronically Signed   By: Alcide Clever M.D.   On: 06/04/2013 18:54    ASSESSMENT:  1. Hypertensive urgency BP now very well controlled on current medical regimen. Appreciate vascular input - RAS very unlikely given recent angiogram.  Headache has resolved off NTG.  2. Acute on chronic diastolic heart failure/flash pulmonary edema, likely related to #1  3. Chronic anemia, with component related to CKD  4. Stage IV CKD complicating hypertension and clinical management - creatinine worsened from overdiuresis and recent malignant HTN 5. Severe peripheral arterial disease including iliac/femoral, and mesenteric/celiac disease.  6. 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 Repleted and now hyperkalemic due to worsening renal function Plan:  1. Continue Coreg/Imdur/amlodipine/catapress and Hydralazine  2. I will avoid ARB/ACE therapy due to renal dysfunction  3. PT consult since patient is fairly weak.  4. Decrease Clonidine to 0.2mg  BID 5. Hold Lasix for now due to worsening renal  function 6. Stop potassium 7. Recheck BMET later this afternoon since renal function worsened and potassium trending upward.      [name] Reichert, MD  06/09/2013  9:19 AM

## 2013-06-10 ENCOUNTER — Inpatient Hospital Stay (HOSPITAL_COMMUNITY): Payer: Medicare Other

## 2013-06-10 LAB — IRON AND TIBC
Iron: 19 ug/dL — ABNORMAL LOW (ref 42–135)
Saturation Ratios: 13 % — ABNORMAL LOW (ref 20–55)
TIBC: 149 ug/dL — ABNORMAL LOW (ref 215–435)

## 2013-06-10 LAB — CBC
HCT: 26.6 % — ABNORMAL LOW (ref 39.0–52.0)
Hemoglobin: 8.9 g/dL — ABNORMAL LOW (ref 13.0–17.0)
MCV: 97.1 fL (ref 78.0–100.0)
RBC: 2.74 MIL/uL — ABNORMAL LOW (ref 4.22–5.81)
WBC: 8.3 10*3/uL (ref 4.0–10.5)

## 2013-06-10 LAB — BASIC METABOLIC PANEL
BUN: 66 mg/dL — ABNORMAL HIGH (ref 6–23)
CO2: 24 mEq/L (ref 19–32)
Chloride: 102 mEq/L (ref 96–112)
Creatinine, Ser: 3.12 mg/dL — ABNORMAL HIGH (ref 0.50–1.35)
Potassium: 4.9 mEq/L (ref 3.5–5.1)

## 2013-06-10 LAB — URINALYSIS, ROUTINE W REFLEX MICROSCOPIC
Glucose, UA: NEGATIVE mg/dL
Hgb urine dipstick: NEGATIVE
Ketones, ur: NEGATIVE mg/dL
Leukocytes, UA: NEGATIVE
Protein, ur: 100 mg/dL — AB
pH: 5.5 (ref 5.0–8.0)

## 2013-06-10 LAB — GLUCOSE, CAPILLARY
Glucose-Capillary: 213 mg/dL — ABNORMAL HIGH (ref 70–99)
Glucose-Capillary: 155 mg/dL — ABNORMAL HIGH (ref 70–99)
Glucose-Capillary: 217 mg/dL — ABNORMAL HIGH (ref 70–99)

## 2013-06-10 MED ORDER — SODIUM CHLORIDE 0.9 % IV SOLN
INTRAVENOUS | Status: DC
  Administered 2013-06-10 – 2013-06-13 (×3): via INTRAVENOUS

## 2013-06-10 NOTE — Consult Note (Signed)
Reason for Consult: Acute renal failure on chronic kidney disease stage IV Referring Physician: Dr. Armanda Magic (cardiology)  HPI:  77 year old man with a history of chronic kidney disease stage IV (baseline creatinine around 2.2-2.5) as well as underlying diabetes mellitus, hypertension that has been difficult to treat (negative studies for renal artery stenosis), history of mesenteric stenosis (status post SMA/celiac stents) and a history of ischemic cardiomyopathy/congestive heart failure (ejection fraction 65-70%) who was admitted to the hospital 6 days ago with worsening exertional dyspnea, pedal edema and hypertensive urgency.  Based on initial findings, he was suspected to have acutely decompensated heart failure for which was started on diuretics. He was also seen by Dr. [name] of vascular surgery for evaluation of possible RAS-his note reviewed. He also has a history of chronic anemia and thrombocytopenia from myelodysplastic syndrome and is on Procrit therapy. Concern is raised as his creatinine that was 2.0 on admission (likely artificially low from dilution in the face of CHF exacerbation) has risen to 3.0 in spite of diuretic discontinuation yesterday-complained of dizziness yesterday. He is not on an ACE inhibitor or ARB-ARB discontinued at last admission for volume depletion/acute kidney injury.   02/13/2013  03/20/2013  04/17/2013  06/04/2013  06/06/2013  06/08/2013  06/09/2013  06/09/2013  06/10/2013   BUN 47 (H) 36 (H) 35 (H) 24 (H) 28 (H) 38 (H) 51 (H) 57 (H) 66 (H)  Creatinine 2.30 (H) 2.61 (H) 2.4 (H) 2.01 (H) 2.24 (H) 2.42 (H) 2.81 (H) 3.03 (H) 3.12 (H)   Input/output-total net -9.2 L since admission 06/04/13: Input 66 cc, output 2650 cc (net -2.5 L) 06/05/13: Input 1448 cc, output 3625 cc (net -1.2 L) 06/06/13: Input 1442 cc, output 3375 cc (net -0.9 L) 06/07/13: Input 511 cc, output 3375 cc (net -2.8 L) 06/08/13: Input 1098 cc, output 1050 cc (net even) 06/09/13: Input 1038  cc, output 1050 cc (net even)  No acute blood pressure drops noted. No contrast exposure noted. He currently reports to be feeling weak/? Orthostatic and has improvement in the shortness of breath since admission.   Past Medical History  Diagnosis Date  . Diabetes mellitus   . CAD (coronary artery disease)     s/b CABG '94  . Hypertension   . Anemia     Macrocytic  . Peripheral vascular disease   . Hyperlipidemia   . Cancer     prostate  . Ulcer   . GERD (gastroesophageal reflux disease)   . Liddle's syndrome   . Hemorrhoids   . Intestinal ischemia   . DVT (deep venous thrombosis)   . Atrial fibrillation     Paroxysmal. S/p PPM  . CHF (congestive heart failure)   . Prostate cancer 1997    XRT and lupron  . Anemia, unspecified 04/24/2013  . Thrombocytopenia, unspecified 04/24/2013  . MDS (myelodysplastic syndrome) 05/22/2013    Past Surgical History  Procedure Laterality Date  . Hiatal hernia repair      and ulcer repair  . Appendectomy    . Pacemaker insertion    . Coronary artery bypass graft  01/22/1993  . Cholecystectomy  Oct 2009    Gall Bladder  . Coronary artery bypass graft  01/03/2012    Procedure: REDO CORONARY ARTERY BYPASS GRAFTING (CABG);  Surgeon: Alleen Borne, MD;  Location: [facility] OR;  Service: Open Heart Surgery;  Laterality: N/A;  Redo CABG x  using bilateral internal mammary arteries;  left leg greater saphenous vein harvested endoscopically  . Celiac artery  angioplasty  05-16-12    and stenting    Family History  Problem Relation Age of Onset  . Diabetes Mother   . Heart disease Mother     Heart Disease before age 20  . Hyperlipidemia Mother   . Hypertension Mother   . Cancer Father     stomach/liver  . Cancer Sister     Breast cancer  . Diabetes Sister   . Heart disease Daughter     Heart Disease before age 58  . Hypertension Daughter   . Heart attack Daughter     Social History:  reports that he quit smoking about 35 years ago. His  smoking use included Cigarettes. He smoked 0.00 packs per day. He has never used smokeless tobacco. He reports that he does not drink alcohol or use illicit drugs.  Allergies:  Allergies  Allergen Reactions  . Ace Inhibitors Cough  . Ibuprofen Other (See Comments)    GI Issues    Medications:  Scheduled: . amLODipine  10 mg Oral Daily  . aspirin  325 mg Oral Daily  . atorvastatin  80 mg Oral Daily  . carvedilol  25 mg Oral BID WC  . cloNIDine  0.2 mg Oral BID  . clopidogrel  75 mg Oral Q breakfast  . hydrALAZINE  50 mg Oral Q8H  . isosorbide mononitrate  120 mg Oral Daily  . pantoprazole  40 mg Oral Daily  . tamsulosin  0.4 mg Oral Daily    Results for orders placed during the hospital encounter of 06/04/13 (from the past 48 hour(s))  GLUCOSE, CAPILLARY     Status: Abnormal   Collection Time    06/08/13  4:56 PM      Result Value Range   Glucose-Capillary 155 (*) 70 - 99 mg/dL  GLUCOSE, CAPILLARY     Status: Abnormal   Collection Time    06/08/13  9:29 PM      Result Value Range   Glucose-Capillary 176 (*) 70 - 99 mg/dL   Comment 1 Notify RN    CBC     Status: Abnormal   Collection Time    06/09/13  4:20 AM      Result Value Range   WBC 8.6  4.0 - 10.5 K/uL   RBC 2.65 (*) 4.22 - 5.81 MIL/uL   Hemoglobin 8.7 (*) 13.0 - 17.0 g/dL   HCT 16.1 (*) 09.6 - 04.5 %   MCV 99.6  78.0 - 100.0 fL   MCH 32.8  26.0 - 34.0 pg   MCHC 33.0  30.0 - 36.0 g/dL   RDW 40.9 (*) 81.1 - 91.4 %   Platelets 122 (*) 150 - 400 K/uL  BASIC METABOLIC PANEL     Status: Abnormal   Collection Time    06/09/13  4:20 AM      Result Value Range   Sodium 139  135 - 145 mEq/L   Potassium 5.5 (*) 3.5 - 5.1 mEq/L   Comment: DELTA CHECK NOTED     NO VISIBLE HEMOLYSIS   Chloride 102  96 - 112 mEq/L   CO2 26  19 - 32 mEq/L   Glucose, Bld 133 (*) 70 - 99 mg/dL   BUN 51 (*) 6 - 23 mg/dL   Creatinine, Ser 7.82 (*) 0.50 - 1.35 mg/dL   Calcium 8.6  8.4 - 95.6 mg/dL   GFR calc non Af Amer 21 (*) >90  mL/min   GFR calc Af Amer 24 (*) >90 mL/min  Comment: (NOTE)     The eGFR has been calculated using the CKD EPI equation.     This calculation has not been validated in all clinical situations.     eGFR's persistently <90 mL/min signify possible Chronic Kidney     Disease.  GLUCOSE, CAPILLARY     Status: Abnormal   Collection Time    06/09/13  5:39 AM      Result Value Range   Glucose-Capillary 129 (*) 70 - 99 mg/dL  GLUCOSE, CAPILLARY     Status: Abnormal   Collection Time    06/09/13 11:38 AM      Result Value Range   Glucose-Capillary 184 (*) 70 - 99 mg/dL   Comment 1 Documented in Chart     Comment 2 Notify RN    BASIC METABOLIC PANEL     Status: Abnormal   Collection Time    06/09/13  3:32 PM      Result Value Range   Sodium 136  135 - 145 mEq/L   Potassium 5.1  3.5 - 5.1 mEq/L   Chloride 101  96 - 112 mEq/L   CO2 25  19 - 32 mEq/L   Glucose, Bld 197 (*) 70 - 99 mg/dL   BUN 57 (*) 6 - 23 mg/dL   Creatinine, Ser 9.60 (*) 0.50 - 1.35 mg/dL   Calcium 8.8  8.4 - 45.4 mg/dL   GFR calc non Af Amer 19 (*) >90 mL/min   GFR calc Af Amer 22 (*) >90 mL/min   Comment: (NOTE)     The eGFR has been calculated using the CKD EPI equation.     This calculation has not been validated in all clinical situations.     eGFR's persistently <90 mL/min signify possible Chronic Kidney     Disease.  GLUCOSE, CAPILLARY     Status: Abnormal   Collection Time    06/09/13  3:52 PM      Result Value Range   Glucose-Capillary 223 (*) 70 - 99 mg/dL   Comment 1 Documented in Chart     Comment 2 Notify RN    GLUCOSE, CAPILLARY     Status: Abnormal   Collection Time    06/09/13  9:15 PM      Result Value Range   Glucose-Capillary 177 (*) 70 - 99 mg/dL   Comment 1 Notify RN    GLUCOSE, CAPILLARY     Status: Abnormal   Collection Time    06/10/13  5:44 AM      Result Value Range   Glucose-Capillary 135 (*) 70 - 99 mg/dL  CBC     Status: Abnormal   Collection Time    06/10/13  6:29 AM       Result Value Range   WBC 8.3  4.0 - 10.5 K/uL   RBC 2.74 (*) 4.22 - 5.81 MIL/uL   Hemoglobin 8.9 (*) 13.0 - 17.0 g/dL   HCT 09.8 (*) 11.9 - 14.7 %   MCV 97.1  78.0 - 100.0 fL   MCH 32.5  26.0 - 34.0 pg   MCHC 33.5  30.0 - 36.0 g/dL   RDW 82.9 (*) 56.2 - 13.0 %   Platelets 121 (*) 150 - 400 K/uL  BASIC METABOLIC PANEL     Status: Abnormal   Collection Time    06/10/13  6:29 AM      Result Value Range   Sodium 136  135 - 145 mEq/L   Potassium 4.9  3.5 -  5.1 mEq/L   Chloride 102  96 - 112 mEq/L   CO2 24  19 - 32 mEq/L   Glucose, Bld 121 (*) 70 - 99 mg/dL   BUN 66 (*) 6 - 23 mg/dL   Creatinine, Ser 1.61 (*) 0.50 - 1.35 mg/dL   Calcium 9.0  8.4 - 09.6 mg/dL   GFR calc non Af Amer 18 (*) >90 mL/min   GFR calc Af Amer 21 (*) >90 mL/min   Comment: (NOTE)     The eGFR has been calculated using the CKD EPI equation.     This calculation has not been validated in all clinical situations.     eGFR's persistently <90 mL/min signify possible Chronic Kidney     Disease.  GLUCOSE, CAPILLARY     Status: Abnormal   Collection Time    06/10/13 10:48 AM      Result Value Range   Glucose-Capillary 213 (*) 70 - 99 mg/dL   Comment 1 Documented in Chart     Comment 2 Notify RN      No results found.  Review of Systems  Constitutional: Negative for fever, chills, weight loss, malaise/fatigue and diaphoresis.  HENT: Negative.   Eyes: Negative.   Respiratory: Positive for shortness of breath. Negative for cough, hemoptysis, sputum production and wheezing.        No shortness of breath at rest-minimal on exertion  Cardiovascular: Positive for orthopnea and leg swelling. Negative for chest pain, palpitations, claudication and PND.       Orthopnea and pedal edema improved since admission  Gastrointestinal: Negative.   Genitourinary: Negative.   Musculoskeletal: Negative.   Skin: Negative.   Neurological: Positive for weakness. Negative for dizziness, tingling, tremors and sensory change.   Endo/Heme/Allergies: Negative.   Psychiatric/Behavioral: Negative for depression. The patient is not nervous/anxious.    Blood pressure 140/60, pulse 66, temperature 98.6 F (37 C), temperature source Oral, resp. rate 17, height 5\' 10"  (1.778 m), weight 74.8 kg (164 lb 14.5 oz), SpO2 98.00%. Physical Exam  Nursing note and vitals reviewed. Constitutional: He is oriented to person, place, and time. He appears well-developed and well-nourished. No distress.  HENT:  Head: Normocephalic and atraumatic.  Nose: Nose normal.  Mouth/Throat: No oropharyngeal exudate.  Eyes: Conjunctivae and EOM are normal. Pupils are equal, round, and reactive to light. No scleral icterus.  Neck: Normal range of motion. Neck supple. No JVD present. No tracheal deviation present. No thyromegaly present.  Cardiovascular: Normal rate and regular rhythm.  Exam reveals no friction rub.   No murmur heard. Respiratory: Effort normal and breath sounds normal. No respiratory distress. He has no wheezes. He has no rales.  GI: Soft. Bowel sounds are normal. He exhibits no distension. There is no tenderness. There is no rebound.  Musculoskeletal: Normal range of motion. He exhibits edema. He exhibits no tenderness.  Trace ankle edema  Lymphadenopathy:    He has no cervical adenopathy.  Neurological: He is alert and oriented to person, place, and time. No cranial nerve deficit. Coordination normal.  Skin: Skin is warm and dry. No rash noted. No erythema.  Psychiatric: He has a normal mood and affect.    Assessment/Plan:  1. Acute renal failure on chronic kidney disease stage IV: Suspected to be hemodynamically mediated in this patient who presented with congestive heart failure and underwent diuretic therapy concomitant with blood pressure lowering efforts for hypertensive urgency. Based on his clinical exam-absence of rales on lung exam and minimal edema around his  ankles, I agree with holding his diuretics for now as we  lower equilibration of interstitial and intravascular volume. I will check a urinalysis/urine electrolytes and renal ultrasound. Further workup to be determined by findings of his urinalysis. Continue to avoid RAS blocking agents as well as IV contrast. 2. Congestive heart failure exacerbation: Appears to be clinically getting better-restart oral diuretics when creatinine trend downwards to his baseline 3. Hypertensive urgency: Blood pressures controlled at this point on multiple agents-reviewed note by Dr. [name], he does not have significant renal artery stenosis. 4. Anemia/thrombocytopenia: Chronic and apparently from myelodysplastic syndrome-anemia possibly also aggravated by chronic kidney disease. On Procrit therapy as an outpatient  Sylwia Cuervo K. 06/10/2013, 12:00 PM

## 2013-06-10 NOTE — Progress Notes (Addendum)
SUBJECTIVE:  No complaints  OBJECTIVE:   Vitals:   Filed Vitals:   06/09/13 2053 06/10/13 0554 06/10/13 0559 06/10/13 0906  BP: 121/93 131/52  140/60  Pulse: 65 66    Temp: 98.5 F (36.9 C) 98.6 F (37 C)    TempSrc: Oral Oral    Resp: 17     Height:      Weight:   164 lb 14.5 oz (74.8 kg)   SpO2: 97% 98%     I&O's:   Intake/Output Summary (Last 24 hours) at 06/10/13 1004 Last data filed at 06/10/13 0607  Gross per 24 hour  Intake    798 ml  Output   1051 ml  Net   -253 ml   TELEMETRY: Reviewed telemetry pt in NSR:     PHYSICAL EXAM General: Well developed, well nourished, in no acute distress Head: Eyes PERRLA, No xanthomas.   Normal cephalic and atramatic  Lungs:   Clear bilaterally to auscultation and percussion. Heart:   HRRR S1 S2 Pulses are 2+ & equal. Abdomen: Bowel sounds are positive, abdomen soft and non-tender without masses  Extremities:   No clubbing, cyanosis or edema.  DP +1 Neuro: Alert and oriented X 3. Psych:  Good affect, responds appropriately   LABS: Basic Metabolic Panel:  Recent Labs  16/10/96 1532 06/10/13 0629  NA 136 136  K 5.1 4.9  CL 101 102  CO2 25 24  GLUCOSE 197* 121*  BUN 57* 66*  CREATININE 3.03* 3.12*  CALCIUM 8.8 9.0   Liver Function Tests: No results found for this basename: AST, ALT, ALKPHOS, BILITOT, PROT, ALBUMIN,  in the last 72 hours No results found for this basename: LIPASE, AMYLASE,  in the last 72 hours CBC:  Recent Labs  06/09/13 0420 06/10/13 0629  WBC 8.6 8.3  HGB 8.7* 8.9*  HCT 26.4* 26.6*  MCV 99.6 97.1  PLT 122* 121*   Cardiac Enzymes: No results found for this basename: CKTOTAL, CKMB, CKMBINDEX, TROPONINI,  in the last 72 hours BNP: No components found with this basename: POCBNP,  D-Dimer: No results found for this basename: DDIMER,  in the last 72 hours Hemoglobin A1C: No results found for this basename: HGBA1C,  in the last 72 hours Fasting Lipid Panel: No results found for this  basename: CHOL, HDL, LDLCALC, TRIG, CHOLHDL, LDLDIRECT,  in the last 72 hours Thyroid Function Tests: No results found for this basename: TSH, T4TOTAL, FREET3, T3FREE, THYROIDAB,  in the last 72 hours Anemia Panel: No results found for this basename: VITAMINB12, FOLATE, FERRITIN, TIBC, IRON, RETICCTPCT,  in the last 72 hours Coag Panel:   Lab Results  Component Value Date   INR 1.06 03/17/2013   INR 1.31 01/03/2012   INR 1.04 01/02/2012    RADIOLOGY: Dg Chest Portable 1 View  06/04/2013   CLINICAL DATA:  Chest pain  EXAM: PORTABLE CHEST - 1 VIEW  COMPARISON:  02/01/2012  FINDINGS: Cardiac shadow is mildly enlarged. A 2 lead pacing device is again seen and stable. Lungs demonstrate mild chronic changes. Postsurgical changes are again seen. No acute focal abnormality is noted.  IMPRESSION: No acute abnormality seen.   Electronically Signed   By: Alcide Clever M.D.   On: 06/04/2013 18:54   ASSESSMENT:  1. Hypertensive urgency BP now very well controlled on current medical regimen. Appreciate vascular input - RAS very unlikely given recent angiogram. Headache has resolved off NTG.  2. Acute on chronic diastolic heart failure/flash pulmonary edema, likely related to #  1  3. Chronic anemia, with component related to CKD  4. Stage IV CKD complicating hypertension and clinical management - creatinine worsened from overdiuresis and recent malignant HTN.  Creatinine continues to increase despite stopping diuretics.   5. Severe peripheral arterial disease including iliac/femoral, and mesenteric/celiac disease.  6. 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 Repleted and now hyperkalemic due to worsening renal function but improved from yesterday.   7.  Deconditioning and weakness Plan:  1. Continue Coreg/Imdur/amlodipine/catapress and Hydralazine  2. I will avoid ARB/ACE therapy due to renal dysfunction  3. Continue PT since patient is fairly weak.  4. Continue to hold Lasix for now due to  worsening renal function  5. Renal consult  6. Since he has no evidence of edema on exam will try a gentle fluid hydration with NS at 50cc/hr.     Quintella Reichert, MD  06/10/2013  10:04 AM

## 2013-06-11 DIAGNOSIS — N189 Chronic kidney disease, unspecified: Secondary | ICD-10-CM

## 2013-06-11 DIAGNOSIS — N179 Acute kidney failure, unspecified: Secondary | ICD-10-CM

## 2013-06-11 LAB — GLUCOSE, CAPILLARY: Glucose-Capillary: 167 mg/dL — ABNORMAL HIGH (ref 70–99)

## 2013-06-11 LAB — PROTEIN / CREATININE RATIO, URINE: Creatinine, Urine: 89.74 mg/dL

## 2013-06-11 LAB — FERRITIN: Ferritin: 1876 ng/mL — ABNORMAL HIGH (ref 22–322)

## 2013-06-11 LAB — BASIC METABOLIC PANEL
BUN: 72 mg/dL — ABNORMAL HIGH (ref 6–23)
CO2: 24 mEq/L (ref 19–32)
Calcium: 9.2 mg/dL (ref 8.4–10.5)
Creatinine, Ser: 3.16 mg/dL — ABNORMAL HIGH (ref 0.50–1.35)
GFR calc non Af Amer: 18 mL/min — ABNORMAL LOW (ref >90)
Glucose, Bld: 123 mg/dL — ABNORMAL HIGH (ref 70–99)

## 2013-06-11 LAB — CBC
Hemoglobin: 9.1 g/dL — ABNORMAL LOW (ref 13.0–17.0)
MCH: 32.3 pg (ref 26.0–34.0)
MCHC: 33.2 g/dL (ref 30.0–36.0)
MCV: 97.2 fL (ref 78.0–100.0)
Platelets: 148 10*3/uL — ABNORMAL LOW (ref 150–400)
RBC: 2.82 MIL/uL — ABNORMAL LOW (ref 4.22–5.81)

## 2013-06-11 MED ORDER — PREDNISONE 20 MG PO TABS
40.0000 mg | ORAL_TABLET | Freq: Once | ORAL | Status: AC
  Administered 2013-06-11: 40 mg via ORAL
  Filled 2013-06-11: qty 2

## 2013-06-11 MED ORDER — PREDNISONE 20 MG PO TABS
20.0000 mg | ORAL_TABLET | Freq: Every day | ORAL | Status: DC
  Administered 2013-06-12 – 2013-06-13 (×2): 20 mg via ORAL
  Filled 2013-06-11 (×4): qty 1

## 2013-06-11 NOTE — Progress Notes (Signed)
Pt resting comfortable on bed with 2L O2 [place] for comfort, VSS, Tylenol 1000 mg PO given as needed for pain on his ankle.

## 2013-06-11 NOTE — Progress Notes (Signed)
       Patient Name: [name]. Date of Encounter: 06/11/2013    SUBJECTIVE:Feels better but weak. Dyspnea has improved.  TELEMETRY:  NSR Filed Vitals:   06/10/13 0906 06/10/13 2003 06/11/13 0514 06/11/13 0742  BP: 140/60 132/55 153/58 144/56  Pulse:  72 81 81  Temp:  98.9 F (37.2 C) 98.7 F (37.1 C)   TempSrc:  Oral Oral   Resp:  18 20 18   Height:      Weight:   169 lb 8.5 oz (76.9 kg)   SpO2:  100% 98% 98%    Intake/Output Summary (Last 24 hours) at 06/11/13 0844 Last data filed at 06/11/13 0724  Gross per 24 hour  Intake   1900 ml  Output   1450 ml  Net    450 ml    LABS: Basic Metabolic Panel:  Recent Labs  21/30/86 0629 06/11/13 0643  NA 136 138  K 4.9 4.7  CL 102 102  CO2 24 24  GLUCOSE 121* 123*  BUN 66* 72*  CREATININE 3.12* 3.16*  CALCIUM 9.0 9.2   CBC:  Recent Labs  06/10/13 0629 06/11/13 0643  WBC 8.3 8.1  HGB 8.9* 9.1*  HCT 26.6* 27.4*  MCV 97.1 97.2  PLT 121* 148*   BNP (last 3 results)  Recent Labs  03/16/13 1848 06/04/13 1849  PROBNP 5934.0* 24561.0*    Radiology/Studies:  No new  Physical Exam: Blood pressure 144/56, pulse 81, temperature 98.7 F (37.1 C), temperature source Oral, resp. rate 18, height 5\' 10"  (1.778 m), weight 169 lb 8.5 oz (76.9 kg), SpO2 98.00%. Weight change: 4 lb 10.1 oz (2.1 kg)   2/6 systolic murmur at the apex No edema or JVD  ASSESSMENT:  1. Acute on chronic diastolic heart failure improved. 2. Acute on chronic kidney injury, related to CKD and diuresis. Creat still increasing. 3. BP now controlled but on multiple agents. 4. Severe peripheral arterial obstructive disease . Plan:  1. Rehab eval 2. Ambulate 3. Resume diuretic therapy when renal function stable. 4. Home soon, possibly today or tomorrow.  Selinda Eon 06/11/2013, 8:44 AM

## 2013-06-11 NOTE — Progress Notes (Signed)
Subjective:  Feels well Not dyspneic at this time Still a little weak  Objective Vital signs in last 24 hours: Filed Vitals:   06/10/13 2003 06/11/13 0514 06/11/13 0742 06/11/13 1206  BP: 132/55 153/58 144/56 141/56  Pulse: 72 81 81 80  Temp: 98.9 F (37.2 C) 98.7 F (37.1 C)    TempSrc: Oral Oral    Resp: 18 20 18 18   Height:      Weight:  76.9 kg (169 lb 8.5 oz)    SpO2: 100% 98% 98% 98%   Weight change: 2.1 kg (4 lb 10.1 oz)  Intake/Output Summary (Last 24 hours) at 06/11/13 1312 Last data filed at 06/11/13 1200  Gross per 24 hour  Intake   1660 ml  Output   1225 ml  Net    435 ml   Physical Exam:  Blood pressure 141/56, pulse 80, temperature 98.7 F (37.1 C), temperature source Oral, resp. rate 18, height 5\' 10"  (1.778 m), weight 76.9 kg (169 lb 8.5 oz), SpO2 98.00%. Pleasant AAM NAD. No O2 No JVD Lungs clear No LE edema  Labs: Basic Metabolic Panel:  Recent Labs Lab 06/06/13 0225 06/07/13 0450 06/08/13 0358 06/09/13 0420 06/09/13 1532 06/10/13 0629 06/11/13 0643  NA 138 141 139 139 136 136 138  K 3.1* 3.3* 3.9 5.5* 5.1 4.9 4.7  CL 99 101 102 102 101 102 102  CO2 27 28 27 26 25 24 24   GLUCOSE 166* 131* 130* 133* 197* 121* 123*  BUN 28* 30* 38* 51* 57* 66* 72*  CREATININE 2.24* 2.11* 2.42* 2.81* 3.03* 3.12* 3.16*  CALCIUM 8.9 8.3* 8.5 8.6 8.8 9.0 9.2    Recent Labs Lab 06/05/13 0730 06/05/13 1700 06/06/13 0225  AST 26 21 21   ALT 22 19 16   ALKPHOS 69 72 63  BILITOT 0.4 0.4 0.3  PROT 6.7 6.7 6.0  ALBUMIN 3.3* 3.2* 3.0*   Recent Labs Lab 06/04/13 1849  06/08/13 0358 06/09/13 0420 06/10/13 0629 06/11/13 0643  WBC 8.9  < > 10.1 8.6 8.3 8.1  NEUTROABS 6.0  --   --   --   --   --   HGB 10.6*  < > 9.8* 8.7* 8.9* 9.1*  HCT 32.8*  < > 29.7* 26.4* 26.6* 27.4*  MCV 99.1  < > 98.0 99.6 97.1 97.2  PLT 124*  < > 113* 122* 121* 148*  < > = values in this interval not displayed.   Recent Labs Lab 06/06/13 0225 06/06/13 1153 06/06/13 1715  06/06/13 2250 06/07/13 0450  CKTOTAL 91 87 93 84 76  CKMB 1.9 1.4 1.8 1.6 1.6  TROPONINI <0.30 <0.30 <0.30 <0.30 <0.30   CBG:  Recent Labs Lab 06/10/13 1048 06/10/13 1613 06/10/13 2119 06/11/13 0546 06/11/13 1110  GLUCAP 213* 155* 217* 128* 167*    Iron Studies:  Recent Labs Lab 06/10/13 1640  IRON 19*  TIBC 149*  FERRITIN 1876*   Results for [name], [name] SR. (MRN [id number]) as of 06/11/2013 13:15  Ref. Range 06/10/2013 18:22  Color, Urine Latest Range: YELLOW  YELLOW  APPearance Latest Range: CLEAR  CLEAR  Specific Gravity, Urine Latest Range: 1.005-1.030  1.014  pH Latest Range: 5.0-8.0  5.5  Glucose Latest Range: NEGATIVE mg/dL NEGATIVE  Bilirubin Urine Latest Range: NEGATIVE  NEGATIVE  Ketones, ur Latest Range: NEGATIVE mg/dL NEGATIVE  Protein Latest Range: NEGATIVE mg/dL 478 (A)  Urobilinogen, UA Latest Range: 0.0-1.0 mg/dL 0.2  Nitrite Latest Range: NEGATIVE  NEGATIVE  Leukocytes, UA Latest  Range: NEGATIVE  NEGATIVE  Hgb urine dipstick Latest Range: NEGATIVE  NEGATIVE  WBC, UA Latest Range: <3 WBC/hpf 0-2  RBC / HPF Latest Range: <3 RBC/hpf 0-2  Squamous Epithelial / LPF Latest Range: RARE  FEW (A)   Studies/Results: US Renal  06/11/2013   CLINICAL DATA:  Acute renal failure on chronic kidney disease.  EXAM: RENAL/URINARY TRACT ULTRASOUND COMPLETE  COMPARISON:  10/11/2012  FINDINGS: Right Kidney:  Length: 12.5 cm. Mild increased cortical echogenicity. No focal mass or hydronephrosis.  Left Kidney:  Length: 12.1 cm. Mild increased cortical echogenicity. Sub cm cyst over the lower pole. No hydronephrosis.  Bladder:  Appears normal for degree of bladder distention. Normal bilateral ureteral jets demonstrated.  Tiny amount of nonspecific free fluid adjacent the lower pole of both kidneys and adjacent to the spleen.  IMPRESSION: Normal size kidneys with mild increased cortical echogenicity bilaterally compatible with medical renal disease. No hydronephrosis.  Sub  cm left renal cyst.  Small amount of nonspecific fluid adjacent the lower pole of both kidneys and adjacent the spleen.   Electronically Signed   By: Elberta Fortis M.D.   On: 06/11/2013 08:11   Medications: . sodium chloride 50 mL/hr at 06/10/13 1200   . amLODipine  10 mg Oral Daily  . aspirin  325 mg Oral Daily  . atorvastatin  80 mg Oral Daily  . carvedilol  25 mg Oral BID WC  . cloNIDine  0.2 mg Oral BID  . clopidogrel  75 mg Oral Q breakfast  . hydrALAZINE  50 mg Oral Q8H  . isosorbide mononitrate  120 mg Oral Daily  . pantoprazole  40 mg Oral Daily  . tamsulosin  0.4 mg Oral Daily   . sodium chloride 50 mL/hr at 06/10/13 1200    I  have reviewed scheduled and prn medications.  Assessment/Plan:   1. Acute renal failure on chronic kidney disease stage IV:  Suspect acute component to be hemodynamically mediated in this patient who presented with congestive heart failure and underwent diuretic therapy concomitant with blood pressure lowering efforts for hypertensive urgency. (chronic likely atherosclerotic vs DM) Ultrasound unremarkable. Based on his clinical exam-absence of rales on lung exam and minimal if any edema, I agree with continuing to hold his diuretics for now as we await re-equilibration of interstitial and intravascular volume. Continue to avoid RAS blocking agents as well as IV contrast.  Creatinine appears to be reaching a plateau Does have proteinuria on UA - will check protein/creat ratio  2. Congestive heart failure exacerbation: Appears to be clinically getting better-restart oral diuretics when creatinine begins to trend down  3. Hypertensive urgency: Blood pressures controlled at this point on multiple agents-reviewed note by Dr. [name], he does not have significant renal artery stenosis.   4. Anemia/thrombocytopenia: Chronic and apparently from myelodysplastic syndrome-anemia possibly also aggravated by chronic kidney disease. On Procrit therapy as an  outpatient  Camille Bal, MD [facility] [phone] pager 06/11/2013, 1:12 PM

## 2013-06-11 NOTE — Progress Notes (Signed)
Physical Therapy Treatment Patient Details Name: [name] KUHNER Sr. MRN: [id number] DOB: 1935/10/28 Today's Date: 06/11/2013 Time: 9147-8295 PT Time Calculation (min): 26 min  PT Assessment / Plan / Recommendation  History of Present Illness He went to his PCP's office today and they noted the increased weight gain, leg swelling, shortness of breath and also were concerned for bradycardia long with reports of a "new murmur" on their physical exam.  For these reasons, the patient was sent to the [facility] ED.  Upon arrival to our ED the patient was markedly hypertensive 245/100 mmHg and c/o SOB   PT Comments   Progress with mobility significantly limited by L foot/ankle pain today; Pain is exacerbated by WBing, and his foot/ankle is hypersensitive to light touch; [name] was unable to take any steps with his L foot, and basically pivoted to chair; Will need to be able to move better before going home; Dr. [name] notified  Follow Up Recommendations  Home health PT;Supervision for mobility/OOB     Does the patient have the potential to tolerate intense rehabilitation     Barriers to Discharge        Equipment Recommendations  None recommended by PT    Recommendations for Other Services    Frequency Min 3X/week   Progress towards PT Goals Progress towards PT goals: Not progressing toward goals - comment (Limited by L foot/ankle pain)  Plan Current plan remains appropriate;Other (comment) (Will likely be able to dc home once L ankle/foot pain decr)    Precautions / Restrictions Precautions Precautions: Fall   Pertinent Vitals/Pain 10/10 L foot and ankle with WBing patient repositioned for comfort elevated for edema and pain control     Mobility  Bed Mobility Bed Mobility: Supine to Sit;Sitting - Scoot to Edge of Bed Supine to Sit: 3: Mod assist Sitting - Scoot to Edge of Bed: 3: Mod assist Details for Bed Mobility Assistance: Ceus for initiation and hand placement; tending to lean  posteriorly Transfers Transfers: Sit to Stand;Stand to Sit Sit to Stand: 3: Mod assist;From bed;With upper extremity assist Stand to Sit: 3: Mod assist;To chair/3-in-1;With armrests Details for Transfer Assistance: cues for safe hand placement, anterior translation and trunk extension; requiring heavy moderat assist to power-up mostly on right LE as LLE is so painful Ambulation/Gait Ambulation/Gait Assistance: 3: Mod assist Ambulation Distance (Feet): 3 Feet Assistive device: Rolling walker Ambulation/Gait Assistance Details: SMall pivot-steps bed to chair today; cues to unweigh painful LLE by pressing down into RW; difficulty stepping RLE as he was unable to shift weight into L stance    Exercises     PT Diagnosis:    PT Problem List:   PT Treatment Interventions:     PT Goals (current goals can now be found in the care plan section) Acute Rehab PT Goals Patient Stated Goal: to get back to prior status; back to gardening Time For Goal Achievement: 06/22/13 Potential to Achieve Goals: Good  Visit Information  Last PT Received On: 06/11/13 Assistance Needed: +1 History of Present Illness: He went to his PCP's office today and they noted the increased weight gain, leg swelling, shortness of breath and also were concerned for bradycardia long with reports of a "new murmur" on their physical exam.  For these reasons, the patient was sent to the [facility] ED.  Upon arrival to our ED the patient was markedly hypertensive 245/100 mmHg and c/o SOB    Subjective Data  Subjective: Reports pain in L ankle; unable to WB;  hypersensitive to light touch L foot/ankle Patient Stated Goal: to get back to prior status; back to gardening   Cognition  Cognition Arousal/Alertness: Awake/alert Behavior During Therapy: WFL for tasks assessed/performed Overall Cognitive Status: Within Functional Limits for tasks assessed    Balance     End of Session PT - End of Session Equipment Utilized During Treatment:  Gait belt Activity Tolerance: Patient limited by fatigue;Patient limited by pain Patient left: in chair;with call bell/phone within reach Nurse Communication: Mobility status   GP     [name], [name] [phone]  06/11/2013, 2:21 PM

## 2013-06-11 NOTE — Progress Notes (Signed)
Pt O4x, lying in bed eating breakfast. On 2L o2 for comfort per night RN. Pt states he can not wear Ted hose at this time d/t ankle pain. scd on. Pt would like MD to explain why he is having pain in his ankles. Pt states he has this pain at home at times. Will continue to monitor.

## 2013-06-12 LAB — GLUCOSE, CAPILLARY
Glucose-Capillary: 180 mg/dL — ABNORMAL HIGH (ref 70–99)
Glucose-Capillary: 204 mg/dL — ABNORMAL HIGH (ref 70–99)

## 2013-06-12 LAB — CBC
HCT: 26.8 % — ABNORMAL LOW (ref 39.0–52.0)
MCH: 32.1 pg (ref 26.0–34.0)
MCHC: 32.8 g/dL (ref 30.0–36.0)
MCV: 97.8 fL (ref 78.0–100.0)
Platelets: 172 10*3/uL (ref 150–400)
RDW: 17.1 % — ABNORMAL HIGH (ref 11.5–15.5)

## 2013-06-12 LAB — RENAL FUNCTION PANEL
Albumin: 2.3 g/dL — ABNORMAL LOW (ref 3.5–5.2)
BUN: 81 mg/dL — ABNORMAL HIGH (ref 6–23)
Calcium: 9 mg/dL (ref 8.4–10.5)
Creatinine, Ser: 3.15 mg/dL — ABNORMAL HIGH (ref 0.50–1.35)
GFR calc non Af Amer: 18 mL/min — ABNORMAL LOW (ref >90)
Phosphorus: 6.3 mg/dL — ABNORMAL HIGH (ref 2.3–4.6)
Sodium: 135 mEq/L (ref 135–145)

## 2013-06-12 MED ORDER — INSULIN ASPART 100 UNIT/ML ~~LOC~~ SOLN
0.0000 [IU] | Freq: Every day | SUBCUTANEOUS | Status: DC
  Administered 2013-06-12: 22:00:00 3 [IU] via SUBCUTANEOUS

## 2013-06-12 MED ORDER — INSULIN ASPART 100 UNIT/ML ~~LOC~~ SOLN
0.0000 [IU] | Freq: Three times a day (TID) | SUBCUTANEOUS | Status: DC
  Administered 2013-06-13 (×2): 5 [IU] via SUBCUTANEOUS
  Administered 2013-06-13: 07:00:00 3 [IU] via SUBCUTANEOUS
  Administered 2013-06-14: 2 [IU] via SUBCUTANEOUS
  Administered 2013-06-14: 3 [IU] via SUBCUTANEOUS

## 2013-06-12 MED ORDER — PREDNISONE 20 MG PO TABS
20.0000 mg | ORAL_TABLET | Freq: Once | ORAL | Status: AC
  Administered 2013-06-12: 20 mg via ORAL
  Filled 2013-06-12 (×2): qty 1

## 2013-06-12 NOTE — Progress Notes (Signed)
1830 Resting comfortably sitting at bedside cardiac chair

## 2013-06-12 NOTE — Progress Notes (Signed)
Physical Therapy Treatment Patient Details Name: [name] ARMISTEAD Sr. MRN: [id number] DOB: [birth year] Today's Date: 06/12/2013 Time: 0911-0950 PT Time Calculation (min): 39 min  PT Assessment / Plan / Recommendation  History of Present Illness He went to his PCP's office today and they noted the increased weight gain, leg swelling, shortness of breath and also were concerned for bradycardia long with reports of a "new murmur" on their physical exam.  For these reasons, the patient was sent to the [facility] ED.  Upon arrival to our ED the patient was markedly hypertensive 245/100 mmHg and c/o SOB   PT Comments   Patient tolerated treatment well today. He continues to have some generalized weakness affecting efficiency of movement as well as endurance. Pt is functionally safe in mobility to be d/c to home with the recommendation of continued therapy in the form of home health to continue to work on deficits in strength and endurance in order to return to prior level of function and increased independence.  Follow Up Recommendations  Home health PT;Supervision for mobility/OOB     Does the patient have the potential to tolerate intense rehabilitation     Barriers to Discharge        Equipment Recommendations  None recommended by PT    Recommendations for Other Services    Frequency Min 3X/week   Progress towards PT Goals Progress towards PT goals: Goals met and updated - see care plan  Plan Current plan remains appropriate;Other (comment)    Precautions / Restrictions Precautions Precautions: Fall Restrictions Weight Bearing Restrictions: No   Pertinent Vitals/Pain SpO2 94 RA at rest in bed; SpO2 94-98% during activity, 97% after returning to rest in chair; HR 88 in bed at rest, 90-99 with activity; no pain in left foot reported today.    Mobility  Bed Mobility Bed Mobility: Supine to Sit;Sitting - Scoot to Edge of Bed Supine to Sit: 6: Modified independent (Device/Increase time) Sitting  - Scoot to Edge of Bed: 6: Modified independent (Device/Increase time) Details for Bed Mobility Assistance: decreased time Transfers Transfers: Sit to Stand;Stand to Sit Sit to Stand: 6: Modified independent (Device/Increase time);From bed;With upper extremity assist Stand to Sit: 6: Modified independent (Device/Increase time);With upper extremity assist;To chair/3-in-1 Details for Transfer Assistance: less control on descent due to pt reported weakness;  Ambulation/Gait Ambulation/Gait Assistance: 5: Supervision Ambulation Distance (Feet): 500 Feet Assistive device: Rolling walker Ambulation/Gait Assistance Details: cues for keeping head up, looking forward; to step inside the walker Gait Pattern: Step-through pattern;Decreased stride length Gait velocity: decreased General Gait Details: posture improved; cues to use the RW safely needed Stairs: Yes Stairs Assistance: 5: Supervision Stairs Assistance Details (indicate cue type and reason): cues for hand placement and safety Stair Management Technique: One rail Left;Step to pattern;Forwards Number of Stairs: 8    Exercises General Exercises - Lower Extremity Long Arc Quad: AROM;Both;15 reps Hip Flexion/Marching: AROM;Both;15 reps   PT Diagnosis:    PT Problem List:   PT Treatment Interventions:     PT Goals (current goals can now be found in the care plan section) Acute Rehab PT Goals Patient Stated Goal: to get back to prior status; back to gardening PT Goal Formulation: With patient Time For Goal Achievement: 06/22/13 Potential to Achieve Goals: Good  Visit Information  Last PT Received On: 06/12/13 Assistance Needed: +1 History of Present Illness: He went to his PCP's office today and they noted the increased weight gain, leg swelling, shortness of breath and also were concerned  for bradycardia long with reports of a "new murmur" on their physical exam.  For these reasons, the patient was sent to the [facility] ED.  Upon arrival to  our ED the patient was markedly hypertensive 245/100 mmHg and c/o SOB    Subjective Data  Subjective: Feeling much better today Patient Stated Goal: to get back to prior status; back to gardening   Cognition  Cognition Arousal/Alertness: Awake/alert Behavior During Therapy: WFL for tasks assessed/performed Overall Cognitive Status: Within Functional Limits for tasks assessed    Balance     End of Session PT - End of Session Equipment Utilized During Treatment: Gait belt Activity Tolerance: Patient tolerated treatment well Patient left: in chair;with call bell/phone within reach;with family/visitor present Nurse Communication: Mobility status   GP     Willette Pa, SPT 06/12/2013, 12:47 PM

## 2013-06-12 NOTE — Progress Notes (Signed)
Seen and agree with SPT note Gray Doering Tabor Giabella Duhart, PT [phone]  

## 2013-06-12 NOTE — Progress Notes (Signed)
       Patient Name: [name] GALLENTINE Sr. Date of Encounter: 06/12/2013    SUBJECTIVE: The left ankle feels better this morning. He denies dyspnea. He was unable to do much with rehabilitation yesterday because he cannot bear weight on his feet.  TELEMETRY:  Normal sinus rhythm Filed Vitals:   06/12/13 0634 06/12/13 0852 06/12/13 1323 06/12/13 1326  BP: 141/57 128/78 118/47 124/54  Pulse: 71 88 60 60  Temp: 98 F (36.7 C)  97.7 F (36.5 C)   TempSrc: Oral  Oral   Resp: 18 16 17    Height:      Weight: 172 lb 6.4 oz (78.2 kg)     SpO2: 100% 99% 98%     Intake/Output Summary (Last 24 hours) at 06/12/13 1526 Last data filed at 06/12/13 1306  Gross per 24 hour  Intake 2267.5 ml  Output    750 ml  Net 1517.5 ml    LABS: Basic Metabolic Panel:  Recent Labs  14/78/29 0643 06/12/13 0542  NA 138 135  K 4.7 5.0  CL 102 101  CO2 24 22  GLUCOSE 123* 198*  BUN 72* 81*  CREATININE 3.16* 3.15*  CALCIUM 9.2 9.0  PHOS  --  6.3*   CBC:  Recent Labs  06/11/13 0643 06/12/13 0542  WBC 8.1 5.6  HGB 9.1* 8.8*  HCT 27.4* 26.8*  MCV 97.2 97.8  PLT 148* 172     Radiology/Studies:  No new data  Physical Exam: Blood pressure 124/54, pulse 60, temperature 97.7 F (36.5 C), temperature source Oral, resp. rate 17, height 5\' 10"  (1.778 m), weight 172 lb 6.4 oz (78.2 kg), SpO2 98.00%. Weight change: 2 lb 13.9 oz (1.3 kg)   Flat neck veins Clear lung fields S4 gallop No edema was still with some tenderness in left ankle  ASSESSMENT:  1. Diastolic heart failure, now compensated after diuresis 2. Acute on chronic kidney failure with crusting of the elevated creatinine 3. Probable gout, improved after one dose of prednisone. 4. Blood pressure, improved  Plan:  1. Prednisone 40 mg today 2. Consider discharge tomorrow if he is ambulatory 3. We'll resume diuretic therapy in a.m.  Selinda Eon 06/12/2013, 3:26 PM

## 2013-06-13 ENCOUNTER — Other Ambulatory Visit: Payer: Self-pay | Admitting: Interventional Cardiology

## 2013-06-13 DIAGNOSIS — M10372 Gout due to renal impairment, left ankle and foot: Secondary | ICD-10-CM | POA: Diagnosis not present

## 2013-06-13 DIAGNOSIS — I5032 Chronic diastolic (congestive) heart failure: Secondary | ICD-10-CM

## 2013-06-13 DIAGNOSIS — E119 Type 2 diabetes mellitus without complications: Secondary | ICD-10-CM

## 2013-06-13 LAB — GLUCOSE, CAPILLARY
Glucose-Capillary: 180 mg/dL — ABNORMAL HIGH (ref 70–99)
Glucose-Capillary: 209 mg/dL — ABNORMAL HIGH (ref 70–99)
Glucose-Capillary: 189 mg/dL — ABNORMAL HIGH (ref 70–99)

## 2013-06-13 LAB — CBC
HCT: 27.6 % — ABNORMAL LOW (ref 39.0–52.0)
Hemoglobin: 9.2 g/dL — ABNORMAL LOW (ref 13.0–17.0)
MCHC: 33.3 g/dL (ref 30.0–36.0)
MCV: 94.8 fL (ref 78.0–100.0)
RBC: 2.91 MIL/uL — ABNORMAL LOW (ref 4.22–5.81)
RDW: 16.5 % — ABNORMAL HIGH (ref 11.5–15.5)
WBC: 5.8 10*3/uL (ref 4.0–10.5)

## 2013-06-13 LAB — BASIC METABOLIC PANEL
BUN: 84 mg/dL — ABNORMAL HIGH (ref 6–23)
Calcium: 9.2 mg/dL (ref 8.4–10.5)
Chloride: 100 mEq/L (ref 96–112)
GFR calc Af Amer: 23 mL/min — ABNORMAL LOW (ref >90)
GFR calc non Af Amer: 20 mL/min — ABNORMAL LOW (ref >90)
Potassium: 5.3 mEq/L — ABNORMAL HIGH (ref 3.5–5.1)

## 2013-06-13 MED ORDER — FUROSEMIDE 80 MG PO TABS
80.0000 mg | ORAL_TABLET | Freq: Two times a day (BID) | ORAL | Status: DC
  Administered 2013-06-13 – 2013-06-14 (×3): 80 mg via ORAL
  Filled 2013-06-13 (×6): qty 1

## 2013-06-13 MED ORDER — POTASSIUM CHLORIDE CRYS ER 20 MEQ PO TBCR: 20.0000 meq | EXTENDED_RELEASE_TABLET | Freq: Every day | ORAL | Status: DC

## 2013-06-13 MED ORDER — FUROSEMIDE 80 MG PO TABS: 80.0000 mg | ORAL_TABLET | Freq: Two times a day (BID) | ORAL | Status: DC

## 2013-06-13 MED ORDER — HYDRALAZINE HCL 50 MG PO TABS: 50.0000 mg | ORAL_TABLET | Freq: Three times a day (TID) | ORAL | Status: DC

## 2013-06-13 MED ORDER — COLCHICINE 0.6 MG PO TABS
0.6000 mg | ORAL_TABLET | Freq: Two times a day (BID) | ORAL | Status: AC
  Administered 2013-06-13 (×2): 0.6 mg via ORAL
  Filled 2013-06-13 (×2): qty 1

## 2013-06-13 MED ORDER — COLCHICINE 0.6 MG PO TABS
0.6000 mg | ORAL_TABLET | Freq: Every day | ORAL | Status: DC
  Administered 2013-06-14: 0.6 mg via ORAL
  Filled 2013-06-13: qty 1

## 2013-06-13 MED ORDER — CARVEDILOL 25 MG PO TABS: 25.0000 mg | ORAL_TABLET | Freq: Two times a day (BID) | ORAL | Status: DC

## 2013-06-13 MED ORDER — POTASSIUM CHLORIDE CRYS ER 20 MEQ PO TBCR
20.0000 meq | EXTENDED_RELEASE_TABLET | Freq: Every day | ORAL | Status: DC
  Administered 2013-06-13: 20 meq via ORAL
  Filled 2013-06-13 (×2): qty 1

## 2013-06-13 MED ORDER — ISOSORBIDE MONONITRATE ER 120 MG PO TB24: 120.0000 mg | ORAL_TABLET | Freq: Every day | ORAL | Status: DC

## 2013-06-13 MED ORDER — PREDNISONE 20 MG PO TABS: 20.0000 mg | ORAL_TABLET | Freq: Every day | ORAL | Status: DC

## 2013-06-13 MED ORDER — POTASSIUM CHLORIDE CRYS ER 20 MEQ PO TBCR: 20.0000 meq | EXTENDED_RELEASE_TABLET | Freq: Two times a day (BID) | ORAL | Status: DC

## 2013-06-13 NOTE — Progress Notes (Signed)
Subjective:  Still waiting on AM lab - BMET has been drawn but not yet resulted Pt states his gout is better No shortness of breath UOP 1500/24 hours without diuretics  Objective Vital signs in last 24 hours: Filed Vitals:   06/13/13 0212 06/13/13 0438 06/13/13 0807 06/13/13 0901  BP:  152/68 171/81   Pulse:  72 70   Temp:  98.1 F (36.7 C) 98 F (36.7 C) 98.1 F (36.7 C)  TempSrc:  Oral Oral Oral  Resp:  18 18 18   Height:      Weight:  81.2 kg (179 lb 0.2 oz)    SpO2: 99% 100% 98% 99%   Weight change: 3 kg (6 lb 9.8 oz)  Intake/Output Summary (Last 24 hours) at 06/13/13 1148 Last data filed at 06/13/13 1118  Gross per 24 hour  Intake   1060 ml  Output   1325 ml  Net   -265 ml  I/O last 3 completed shifts: In: 2077.5 [P.O.:1480; I.V.:597.5] Out: 1800 [Urine:1800] Total I/O In: 360 [P.O.:360] Out: 275 [Urine:275]   Physical Exam:  Blood pressure 141/56, pulse 80, temperature 98.7 F (37.1 C), temperature source Oral, resp. rate 18, height 5\' 10"  (1.778 m), weight 76.9 kg (169 lb 8.5 oz), SpO2 98.00%. Pleasant AAM  Lying in bed NAD.  No O2 No JVD Lungs clear No RLE edema, trace left ankle edema  Labs: Basic Metabolic Panel:  Recent Labs Lab 06/07/13 0450 06/08/13 0358 06/09/13 0420 06/09/13 1532 06/10/13 0629 06/11/13 0643 06/12/13 0542  NA 141 139 139 136 136 138 135  K 3.3* 3.9 5.5* 5.1 4.9 4.7 5.0  CL 101 102 102 101 102 102 101  CO2 28 27 26 25 24 24 22   GLUCOSE 131* 130* 133* 197* 121* 123* 198*  BUN 30* 38* 51* 57* 66* 72* 81*  CREATININE 2.11* 2.42* 2.81* 3.03* 3.12* 3.16* 3.15*  CALCIUM 8.3* 8.5 8.6 8.8 9.0 9.2 9.0  PHOS  --   --   --   --   --   --  6.3*    Recent Labs Lab 06/12/13 0542  ALBUMIN 2.3*    Recent Labs Lab 06/10/13 0629 06/11/13 0643 06/12/13 0542 06/13/13 0625  WBC 8.3 8.1 5.6 5.8  HGB 8.9* 9.1* 8.8* 9.2*  HCT 26.6* 27.4* 26.8* 27.6*  MCV 97.1 97.2 97.8 94.8  PLT 121* 148* 172 PLATELET CLUMPS NOTED ON SMEAR,  COUNT APPEARS ADEQUATE     Recent Labs Lab 06/06/13 1153 06/06/13 1715 06/06/13 2250 06/07/13 0450  CKTOTAL 87 93 84 76  CKMB 1.4 1.8 1.6 1.6  TROPONINI <0.30 <0.30 <0.30 <0.30   CBG:  Recent Labs Lab 06/12/13 1109 06/12/13 1633 06/12/13 2139 06/13/13 0636 06/13/13 1103  GLUCAP 204* 239* 264* 180* 211*    Iron Studies:   Recent Labs Lab 06/10/13 1640  IRON 19*  TIBC 149*  FERRITIN 1876*   Results for [name], [name] SR. (MRN [id number]) as of 06/11/2013 13:15  Ref. Range 06/10/2013 18:22  Color, Urine Latest Range: YELLOW  YELLOW  APPearance Latest Range: CLEAR  CLEAR  Specific Gravity, Urine Latest Range: 1.005-1.030  1.014  pH Latest Range: 5.0-8.0  5.5  Glucose Latest Range: NEGATIVE mg/dL NEGATIVE  Bilirubin Urine Latest Range: NEGATIVE  NEGATIVE  Ketones, ur Latest Range: NEGATIVE mg/dL NEGATIVE  Protein Latest Range: NEGATIVE mg/dL 696 (A)  Urobilinogen, UA Latest Range: 0.0-1.0 mg/dL 0.2  Nitrite Latest Range: NEGATIVE  NEGATIVE  Leukocytes, UA Latest Range: NEGATIVE  NEGATIVE  Hgb urine dipstick Latest Range: NEGATIVE  NEGATIVE  WBC, UA Latest Range: <3 WBC/hpf 0-2  RBC / HPF Latest Range: <3 RBC/hpf 0-2  Squamous Epithelial / LPF Latest Range: RARE  FEW (A)  UPC 690 mg  Studies/Results: No results found. Medications:   . amLODipine  10 mg Oral Daily  . aspirin  325 mg Oral Daily  . atorvastatin  80 mg Oral Daily  . carvedilol  25 mg Oral BID WC  . cloNIDine  0.2 mg Oral BID  . clopidogrel  75 mg Oral Q breakfast  . furosemide  80 mg Oral BID  . hydrALAZINE  50 mg Oral Q8H  . insulin aspart  0-15 Units Subcutaneous TID WC  . insulin aspart  0-5 Units Subcutaneous QHS  . isosorbide mononitrate  120 mg Oral Daily  . pantoprazole  40 mg Oral Daily  . potassium chloride  20 mEq Oral Daily  . predniSONE  20 mg Oral Q breakfast  . tamsulosin  0.4 mg Oral Daily      I  have reviewed scheduled and prn medications.  Assessment/Plan:   1.  Acute renal failure superimposed on chronic kidney disease stage IV (baseline creat 2-2.2):  Suspect acute component hemodynamically mediated in  patient who presented with congestive heart failure and underwent diuretic therapy concomitant with blood pressure lowering efforts for hypertensive urgency. (chronic likely atherosclerotic vs DM) Ultrasound unremarkable. Diuretics still on hold No renal function panel back yet from today As of yesterday creatinine was still at plateau at around 3.1 If stable or improved today, I would have no issue with restarting diuretics or discharge  2. Diastolic heart failure exacerbation:  Stable Dr. [name] indicates will prob start diuretics back today  3. Hypertensive urgency:  Blood mod pressures controlled at this point on multiple agents Reviewed note by Dr. [name], he does not have significant renal artery stenosis.   4. Anemia/thrombocytopenia:  Chronic and apparently from myelodysplastic syndrome-anemia possibly also aggravated by chronic kidney disease.  On Procrit therapy as an outpatient  5. DM Nurses concerned about sending pt out on insulin Advised to discuss with primary service  Camille Bal, MD [facility] [phone] pager 06/13/2013, 11:48 AM

## 2013-06-13 NOTE — Discharge Summary (Addendum)
Patient ID: [name] Fetters Sr. MRN: [id number] DOB/AGE: Dec 13, 1935 77 y.o.  Admit date: 06/04/2013 Discharge date: 06/14/2013 Patient Active Problem List   Diagnosis Date Noted  . Gout due to renal impairment, left ankle and foot 06/13/2013    Priority: High    Class: Acute  . Hypertensive urgency 06/05/2013  . Coronary atherosclerosis of native coronary artery 06/05/2013  . MDS (myelodysplastic syndrome) 05/22/2013  . Anemia, unspecified 04/24/2013  . Thrombocytopenia, unspecified 04/24/2013  . Anemia of chronic disease 03/19/2013  . Hypokalemia 03/18/2013  . Hypomagnesemia 03/18/2013  . CKD (chronic kidney disease), stage IV 03/18/2013  . NSVT (nonsustained ventricular tachycardia) 03/18/2013  . Decreased hemoglobin 03/17/2013  . Acute on chronic diastolic CHF (congestive heart failure) 03/17/2013  . Diabetes mellitus 03/17/2013  . Hypertension 03/17/2013  . Thrombocytopenia 03/17/2013  . Aftercare following surgery of the circulatory system, NEC 05/29/2012  . Mesenteric artery stenosis 05/01/2012  . Peripheral vascular disease, unspecified 05/01/2012  . Intermediate coronary syndrome 12/24/2011  . PAF (paroxysmal atrial fibrillation) 12/24/2011  . PAD (peripheral artery disease) 12/24/2011  . HTN (hypertension), malignant 12/24/2011  . Chronic vascular insufficiency of intestine 12/20/2011   Primary Discharge Diagnosis: Acute on chronic diastolic heart failure, resolved with aggressive diuresis  Secondary Discharge Diagnosis: Hypertensive emergency, resolved with more aggressive therapy Acute on chronic kidney disease stage IV Chronic multifactorial anemia Coronary artery disease Diabetes mellitus Acute gout Tachybradycardia syndrome with history of PAF and bradycardia. DDD pacemaker  Significant Diagnostic Studies:  Duplex imaging of the renal arteries  Consults: 1. Nephrology, Dr. Allena Katz 2. Vascular surgery, Dr. Lynn Ito Course: 77 year old  gentleman who was admitted to the hospital with severe dyspnea. Clinical evaluation demonstrated acute on chronic diastolic heart failure and severe blood pressure elevation with systolic pressures greater than 210 mm mercury. He ruled out for myocardial infarction. Aggressive intravenous followed by up titration of oral antihypertensive therapy was undertaken. Diuresis with IV Lasix was performed. The blood pressure and heart failure gradually resolved.  As a consequence of aggressive diuresis he developed acute on chronic kidney injury and also an acute flare of gout.  Diuretics were held for several days with slow IV hydration.  Oral prednisone was started to treat Gout. A five-day course of prednisone was administered.  To prevent recurrence of heart failure, discharge diagnosis of Lasix would be doubled compared to preadmission.  Potassium on the day of discharge was 5.3. Restart in diuretic therapy should help decrease this as will prednisone.   Discharge Exam: Blood pressure 165/72, pulse 60, temperature 97.7 F (36.5 C), temperature source Oral, resp. rate 18, height 5\' 10"  (1.778 m), weight 179 lb 8 oz (81.421 kg), SpO2 98.00%.    2 of 6 systolic murmur Absent edema. Mild left great toe warmth has significantly decreased Chest is clear with the exception of faint basilar crackles Neck veins are moderate  Labs:   Lab Results  Component Value Date   WBC 6.6 06/14/2013   HGB 9.5* 06/14/2013   HCT 29.1* 06/14/2013   MCV 96.4 06/14/2013   PLT 212 06/14/2013     Recent Labs Lab 06/14/13 0535  NA 136  K 4.1  CL 101  CO2 20  BUN 88*  CREATININE 2.98*  CALCIUM 9.3  GLUCOSE 135*   Lab Results  Component Value Date   CKTOTAL 76 06/07/2013   CKMB 1.6 06/07/2013   TROPONINI <0.30 06/07/2013    Lab Results  Component Value Date   CHOL  104 12/25/2011   Lab Results  Component Value Date   HDL 56 12/25/2011   Lab Results  Component Value Date   LDLCALC 40 12/25/2011    Lab Results  Component Value Date   TRIG 41 12/25/2011   Lab Results  Component Value Date   CHOLHDL 1.9 12/25/2011   No results found for this basename: LDLDIRECT      Radiology:  CLINICAL DATA: Chest pain  EXAM:  PORTABLE CHEST - 1 VIEW  COMPARISON: 02/01/2012  FINDINGS:  Cardiac shadow is mildly enlarged. A 2 lead pacing device is again  seen and stable. Lungs demonstrate mild chronic changes.  Postsurgical changes are again seen. No acute focal abnormality is  noted.  IMPRESSION:  No acute abnormality seen.  Electronically Signed  By: Alcide Clever M.D.  On: 06/04/2013 18:54    EKG: Atrial pacing with prominent voltage  FOLLOW UP PLANS AND APPOINTMENTS      Future Appointments Provider Department Dept Phone   06/20/2013 12:00 PM Lesleigh Noe, MD [facility] [phone]   06/22/2013 1:45 PM [facility] Only [facility] [phone]   06/22/2013 2:15 PM [facility] In[facility] Nurse [facility] [phone]   06/22/2013 2:45 PM Artis Delay, MD [facility] [phone]   08/13/2013 9:00 AM [facility] [address] [phone]   Eat a light meal the night before the exam Nothing to eat or drink for at least 9 hours before the exam No gum chewing, or smoking the morning of the exam Please take your morning medications with Small sips of water, especially blood pressure medication *Very Important* Please wear 2 piece clothing   08/13/2013 9:45 AM Nada Libman, MD [facility] [phone]       Medication List    STOP taking these medications       metoprolol 50 MG tablet  Commonly known as:  LOPRESSOR      TAKE these medications       amiodarone 200 MG tablet  Commonly known as:  PACERONE  Take 100 mg by mouth daily.     amLODipine 10 MG tablet  Commonly known as:  NORVASC  Take 10 mg by mouth  daily.     ARTHRITIS PAIN RELIEF 650 MG CR tablet  Generic drug:  acetaminophen  Take 1,300 mg by mouth 2 (two) times daily.     aspirin 325 MG tablet  Take 325 mg by mouth daily.     atorvastatin 80 MG tablet  Commonly known as:  LIPITOR  Take 80 mg by mouth daily.     CALCIUM PO  Take 2,000 mg by mouth daily.     carvedilol 25 MG tablet  Commonly known as:  COREG  Take 1 tablet (25 mg total) by mouth 2 (two) times daily with a meal.     cholecalciferol 1000 UNITS tablet  Commonly known as:  VITAMIN D  Take 1,000 Units by mouth daily.     cloNIDine 0.2 MG tablet  Commonly known as:  CATAPRES  Take 0.2 mg by mouth 2 (two) times daily.     clopidogrel 75 MG tablet  Commonly known as:  PLAVIX  Take 75 mg by mouth daily.     colchicine 0.6 MG tablet  Take 1 tablet (0.6 mg total) by mouth daily.     furosemide 80 MG tablet  Commonly known as:  LASIX  Take 1 tablet (80 mg total) by mouth 2 (two) times daily.     glucose blood test strip     hydrALAZINE 50 MG tablet  Commonly known as:  APRESOLINE  Take 1 tablet (50 mg total) by mouth 3 (three) times daily.     isosorbide mononitrate 120 MG 24 hr tablet  Commonly known as:  IMDUR  Take 1 tablet (120 mg total) by mouth daily.     pantoprazole 40 MG tablet  Commonly known as:  PROTONIX  Take 40 mg by mouth daily.     potassium chloride SA 20 MEQ tablet  Commonly known as:  K-DUR,KLOR-CON  Take 0.5 tablets (10 mEq total) by mouth daily.     silodosin 8 MG Caps capsule  Commonly known as:  RAPAFLO  Take 8 mg by mouth daily.     VITAMIN B-12 IJ  Inject as directed.       Follow-up Information   Follow up with Mickie Hillier, MD. (See as soon as possible about gout.)    Specialty:  Family Medicine   Contact information:   [address] [place] [address] [phone]       Follow up with Dagoberto Ligas., MD On 07/16/2013. (12:45 PM)    Specialty:  Nephrology   Contact information:   [address]  [facility] [place] [address] [phone]       BRING ALL MEDICATIONS WITH YOU TO FOLLOW UP APPOINTMENTS  Time spent with patient to include physician time: 40 min Signed: Lesleigh Noe 06/14/2013, 1:39 PM

## 2013-06-13 NOTE — Progress Notes (Signed)
Notified Dr.Sivak of elevated blood sugar and that patient stated oral hypoglycemic medications were discontinued due to kidney failure. No sliding scale was ordered. Doctor gave orders for sliding scale to manage patient's blood sugar and patient given insulin according to SSI order. Explained to patient and patient understood. Will continue to monitor to end of shift.

## 2013-06-13 NOTE — Plan of Care (Signed)
Problem: Food- and Nutrition-Related Knowledge Deficit (NB-1.1) Goal: Nutrition education Formal process to instruct or train a patient/client in a skill or to impart knowledge to help patients/clients voluntarily manage or modify food choices and eating behavior to maintain or improve health. Outcome: Completed/Met Date Met:  06/13/13  RD consulted for nutrition education regarding diabetes. Consult originally for pt who would be new to insulin however that plan has changed. Pt will now being going home on his original medication regimen. Pt reports following DM diet at home. Reviewed DM diet guidelines with pt. Wife is not currently in room. RN has given pt written materials and has started the DM videos. No other questions at this time.     Lab Results  Component Value Date    HGBA1C 5.7* 03/17/2013    RD provided "Carbohydrate Counting for People with Diabetes" handout from the Academy of Nutrition and Dietetics. Discussed different food groups and their effects on blood sugar, emphasizing carbohydrate-containing foods. Provided list of carbohydrates and recommended serving sizes of common foods.  Discussed importance of controlled and consistent carbohydrate intake throughout the day. Provided examples of ways to balance meals/snacks and encouraged intake of high-fiber, whole grain complex carbohydrates. Teach back method used.  Expect fair compliance.  Body mass index is 25.69 kg/(m^2). Pt meets criteria for Overweight based on current BMI.  Current diet order is CHO Modified, patient is consuming approximately 100% of meals at this time. Labs and medications reviewed. No further nutrition interventions warranted at this time. RD contact information provided. If additional nutrition issues arise, please re-consult RD.  Kendell Bane RD, LDN, CNSC [phone] Pager [phone] After Hours Pager

## 2013-06-13 NOTE — Progress Notes (Addendum)
The plan was to discharge the patient today. His metabolic panel was not back on morning rounds. The potassium is 5.3. Lasix has been restarted earlier this morning. After reading the nephrology note, I will use colchicine as a longer term management strategy for gout. The prednisone which was to be used for 4 days only will be discontinued. The prednisone is aggravating the patient's blood sugar. I was trying to avoid colchicine because the patient suffers from chronic abdominal discomfort related to intestinal ischemia.  Nurse reports that the patient to walk only from the bed to the door.  We will plan discharge tomorrow rather than today, assuming kidney function is stable, he is able to ambulate, and no additional difficulties arise.  Patient Active Problem List   Diagnosis Date Noted  . Gout due to renal impairment, left ankle and foot 06/13/2013    Priority: High    Class: Acute  . Hypertensive urgency 06/05/2013  . Coronary atherosclerosis of native coronary artery 06/05/2013  . MDS (myelodysplastic syndrome) 05/22/2013  . Anemia, unspecified 04/24/2013  . Thrombocytopenia, unspecified 04/24/2013  . Anemia of chronic disease 03/19/2013  . Hypokalemia 03/18/2013  . Hypomagnesemia 03/18/2013  . CKD (chronic kidney disease), stage IV 03/18/2013  . NSVT (nonsustained ventricular tachycardia) 03/18/2013  . Decreased hemoglobin 03/17/2013  . Acute on chronic diastolic CHF (congestive heart failure) 03/17/2013  . Diabetes mellitus 03/17/2013  . Hypertension 03/17/2013  . Thrombocytopenia 03/17/2013  . Aftercare following surgery of the circulatory system, NEC 05/29/2012  . Mesenteric artery stenosis 05/01/2012  . Peripheral vascular disease, unspecified 05/01/2012  . Intermediate coronary syndrome 12/24/2011  . PAF (paroxysmal atrial fibrillation) 12/24/2011  . PAD (peripheral artery disease) 12/24/2011  . HTN (hypertension), malignant 12/24/2011  . Chronic vascular insufficiency of  intestine 12/20/2011

## 2013-06-14 ENCOUNTER — Encounter: Payer: Self-pay | Admitting: Hematology and Oncology

## 2013-06-14 DIAGNOSIS — M109 Gout, unspecified: Secondary | ICD-10-CM

## 2013-06-14 LAB — BASIC METABOLIC PANEL
BUN: 88 mg/dL — ABNORMAL HIGH (ref 6–23)
CO2: 20 mEq/L (ref 19–32)
Calcium: 9.3 mg/dL (ref 8.4–10.5)
Chloride: 101 mEq/L (ref 96–112)
Creatinine, Ser: 2.98 mg/dL — ABNORMAL HIGH (ref 0.50–1.35)
GFR calc Af Amer: 22 mL/min — ABNORMAL LOW (ref >90)
GFR calc non Af Amer: 19 mL/min — ABNORMAL LOW (ref >90)
Glucose, Bld: 135 mg/dL — ABNORMAL HIGH (ref 70–99)
Potassium: 4.1 mEq/L (ref 3.5–5.1)
Sodium: 136 mEq/L (ref 135–145)

## 2013-06-14 LAB — CBC
HCT: 29.1 % — ABNORMAL LOW (ref 39.0–52.0)
Hemoglobin: 9.5 g/dL — ABNORMAL LOW (ref 13.0–17.0)
MCH: 31.5 pg (ref 26.0–34.0)
MCHC: 32.6 g/dL (ref 30.0–36.0)
MCV: 96.4 fL (ref 78.0–100.0)
Platelets: 212 10*3/uL (ref 150–400)
RBC: 3.02 MIL/uL — ABNORMAL LOW (ref 4.22–5.81)
RDW: 16.3 % — ABNORMAL HIGH (ref 11.5–15.5)
WBC: 6.6 10*3/uL (ref 4.0–10.5)

## 2013-06-14 LAB — GLUCOSE, CAPILLARY
Glucose-Capillary: 141 mg/dL — ABNORMAL HIGH (ref 70–99)
Glucose-Capillary: 182 mg/dL — ABNORMAL HIGH (ref 70–99)

## 2013-06-14 MED ORDER — POTASSIUM CHLORIDE CRYS ER 20 MEQ PO TBCR: 10.0000 meq | EXTENDED_RELEASE_TABLET | Freq: Every day | ORAL | Status: DC

## 2013-06-14 MED ORDER — COLCHICINE 0.6 MG PO TABS: 0.6000 mg | ORAL_TABLET | Freq: Every day | ORAL | Status: DC

## 2013-06-14 NOTE — Progress Notes (Signed)
Pt being dc to home with wife, pt given dc instructions, follow up appointments and medications reviewed with pt, pt and wife verbalized understanding, pt stable, leaving via wheelchair,

## 2013-06-15 ENCOUNTER — Other Ambulatory Visit: Payer: Medicare Other | Admitting: Lab

## 2013-06-15 ENCOUNTER — Ambulatory Visit: Payer: Medicare Other

## 2013-06-20 ENCOUNTER — Encounter: Payer: Self-pay | Admitting: Interventional Cardiology

## 2013-06-20 ENCOUNTER — Ambulatory Visit (INDEPENDENT_AMBULATORY_CARE_PROVIDER_SITE_OTHER): Payer: Medicare Other | Admitting: Interventional Cardiology

## 2013-06-20 VITALS — BP 158/72 | HR 68 | Ht 70.0 in | Wt 177.0 lb

## 2013-06-20 DIAGNOSIS — I160 Hypertensive urgency: Secondary | ICD-10-CM

## 2013-06-20 DIAGNOSIS — I509 Heart failure, unspecified: Secondary | ICD-10-CM

## 2013-06-20 DIAGNOSIS — I5032 Chronic diastolic (congestive) heart failure: Secondary | ICD-10-CM

## 2013-06-20 DIAGNOSIS — M109 Gout, unspecified: Secondary | ICD-10-CM

## 2013-06-20 DIAGNOSIS — I10 Essential (primary) hypertension: Secondary | ICD-10-CM

## 2013-06-20 DIAGNOSIS — I5033 Acute on chronic diastolic (congestive) heart failure: Secondary | ICD-10-CM

## 2013-06-20 DIAGNOSIS — M10372 Gout due to renal impairment, left ankle and foot: Secondary | ICD-10-CM

## 2013-06-20 DIAGNOSIS — N184 Chronic kidney disease, stage 4 (severe): Secondary | ICD-10-CM

## 2013-06-20 DIAGNOSIS — E876 Hypokalemia: Secondary | ICD-10-CM

## 2013-06-20 LAB — BASIC METABOLIC PANEL
BUN: 45 mg/dL — ABNORMAL HIGH (ref 6–23)
Calcium: 9.1 mg/dL (ref 8.4–10.5)
Chloride: 104 mEq/L (ref 96–112)
Glucose, Bld: 124 mg/dL — ABNORMAL HIGH (ref 70–99)
Potassium: 3.2 mEq/L — ABNORMAL LOW (ref 3.5–5.1)
Sodium: 142 mEq/L (ref 135–145)

## 2013-06-20 MED ORDER — FUROSEMIDE 80 MG PO TABS: 120.0000 mg | ORAL_TABLET | Freq: Two times a day (BID) | ORAL | Status: DC

## 2013-06-20 NOTE — Progress Notes (Signed)
Patient ID: [name]., male   DOB: [birth year], 77 y.o.   MRN: [id number]    [address] [place], [place]  [address] Phone: [phone] Fax:  [phone]  Date:  06/20/2013   ID:  [name] Sr., DOB 05-08-1936, MRN [id number]  PCP:  Mickie Hillier, MD   ASSESSMENT:  1. Chronic diastolic heart failure, improved on recent hospital stay, but now showed evidence of recurring lower extremity edema. 2. Coronary atherosclerotic heart disease, stable 3. Acute on chronic kidney injury, today's laboratory data is pending 4.  Acute gout, improved  PLAN:  1. Due to marked lower extremity edema, I will further increase furosemide 120 mg twice a day 2. Followup in 4-6 weeks 3. Basic metabolic panel today. 4. Increase potassium to 20 mEq daily 5. Repeat basic metabolic panel in one week   SUBJECTIVE: [name]. is a 77 y.o. male who was discharged from the hospital one week ago. He is having lower extremity swelling. He had mild dyspnea last evening. His weight today compared to the discharge weight is 2 pounds less. He has not had dizziness or near-syncope. Acute gout he had in the hospital has improved. Laboratory data is pending.   Wt Readings from Last 3 Encounters:  06/20/13 177 lb (80.287 kg)  06/14/13 179 lb 8 oz (81.421 kg)  06/14/13 179 lb 8 oz (81.421 kg)     Past Medical History  Diagnosis Date  . Diabetes mellitus   . CAD (coronary artery disease)     s/b CABG '94  . Hypertension   . Anemia     Macrocytic  . Peripheral vascular disease   . Hyperlipidemia   . Cancer     prostate  . Ulcer   . GERD (gastroesophageal reflux disease)   . Liddle's syndrome   . Hemorrhoids   . Intestinal ischemia   . DVT (deep venous thrombosis)   . Atrial fibrillation     Paroxysmal. S/p PPM  . CHF (congestive heart failure)   . Prostate cancer 1997    XRT and lupron  . Anemia, unspecified 04/24/2013  . Thrombocytopenia, unspecified  04/24/2013  . MDS (myelodysplastic syndrome) 05/22/2013    Current Outpatient Prescriptions  Medication Sig Dispense Refill  . acetaminophen (ARTHRITIS PAIN RELIEF) 650 MG CR tablet Take 1,300 mg by mouth 2 (two) times daily.       Marland Kitchen amiodarone (PACERONE) 200 MG tablet Take 100 mg by mouth daily.      Marland Kitchen amLODipine (NORVASC) 10 MG tablet Take 10 mg by mouth daily.      Marland Kitchen aspirin 325 MG tablet Take 325 mg by mouth daily.      Marland Kitchen atorvastatin (LIPITOR) 80 MG tablet Take 80 mg by mouth daily.      Marland Kitchen CALCIUM PO Take 2,000 mg by mouth daily.       . carvedilol (COREG) 25 MG tablet Take 1 tablet (25 mg total) by mouth 2 (two) times daily with a meal.  60 tablet  11  . cholecalciferol (VITAMIN D) 1000 UNITS tablet Take 1,000 Units by mouth daily.       . cloNIDine (CATAPRES) 0.2 MG tablet Take 0.2 mg by mouth 2 (two) times daily.       . clopidogrel (PLAVIX) 75 MG tablet Take 75 mg by mouth daily.      . colchicine 0.6 MG tablet Take 1 tablet (0.6 mg total) by mouth daily.  30 tablet  11  . Cyanocobalamin (VITAMIN B-12 IJ) Inject as directed.      . furosemide (LASIX) 80 MG tablet Take 1 tablet (80 mg total) by mouth 2 (two) times daily.  60 tablet  6  . hydrALAZINE (APRESOLINE) 50 MG tablet Take 1 tablet (50 mg total) by mouth 3 (three) times daily.  90 tablet  11  . isosorbide mononitrate (IMDUR) 120 MG 24 hr tablet Take 1 tablet (120 mg total) by mouth daily.  30 tablet  11  . pantoprazole (PROTONIX) 40 MG tablet Take 40 mg by mouth daily.      . potassium chloride SA (K-DUR,KLOR-CON) 20 MEQ tablet Take 0.5 tablets (10 mEq total) by mouth daily.      . silodosin (RAPAFLO) 8 MG CAPS capsule Take 8 mg by mouth daily.       Marland Kitchen glucose blood test strip        No current facility-administered medications for this visit.    Allergies:    Allergies  Allergen Reactions  . Ace Inhibitors Cough  . Ibuprofen Other (See Comments)    GI Issues    Social History:  The patient  reports that he quit  smoking about 35 years ago. His smoking use included Cigarettes. He smoked 0.00 packs per day. He has never used smokeless tobacco. He reports that he does not drink alcohol or use illicit drugs.   ROS:  Please see the history of present illness.    All other systems reviewed and negative.   OBJECTIVE: VS:  BP 158/72  Pulse 68  Ht 5\' 10"  (1.778 m)  Wt 177 lb (80.287 kg)  BMI 25.40 kg/m2 Well nourished, well developed, in no acute distress HEENT: normal Neck: JVD moderate elevation. Carotid bruit absent  Cardiac:  normal S1, S2; RRR; no murmur Lungs:  clear to auscultation bilaterally, no wheezing, rhonchi or rales Abd: soft, nontender, no hepatomegaly Ext: Edema 3+ bilateral from ankles to knees. Pulses difficult to palpate Skin: warm and dry Neuro:  CNs 2-12 intact, no focal abnormalities noted  EKG:  Not repeated       Signed, Darci Needle III, MD 06/20/2013 1:13 PM

## 2013-06-20 NOTE — Patient Instructions (Signed)
Increase lasix to 120mg  (1 1/2 tablet) twice daily  You have a follow up appt scheduled 07/31/2013 @11 :15am

## 2013-06-21 ENCOUNTER — Telehealth: Payer: Self-pay

## 2013-06-21 DIAGNOSIS — I5033 Acute on chronic diastolic (congestive) heart failure: Secondary | ICD-10-CM

## 2013-06-21 MED ORDER — POTASSIUM CHLORIDE CRYS ER 20 MEQ PO TBCR: 20.0000 meq | EXTENDED_RELEASE_TABLET | Freq: Every day | ORAL | Status: DC

## 2013-06-21 NOTE — Telephone Encounter (Signed)
pt given lab results.Increase potassium to two tablets daily for 2 days then 1 tablet daily there after. Repeat BMET 1 week 06/28/13 pt verbalized understanding

## 2013-06-21 NOTE — Telephone Encounter (Signed)
Message copied by Jarvis Newcomer on Thu Jun 21, 2013  9:11 AM ------      Message from: Verdis Prime      Created: Wed Jun 20, 2013  6:54 PM       Increase potassium to two tablets daily for 2 days then 1 tablet daily there after. Repeat BMET 1 week. ------

## 2013-06-22 ENCOUNTER — Other Ambulatory Visit: Payer: Medicare Other

## 2013-06-22 ENCOUNTER — Telehealth: Payer: Self-pay | Admitting: Hematology and Oncology

## 2013-06-22 ENCOUNTER — Ambulatory Visit (HOSPITAL_BASED_OUTPATIENT_CLINIC_OR_DEPARTMENT_OTHER): Payer: Medicare Other

## 2013-06-22 ENCOUNTER — Other Ambulatory Visit (HOSPITAL_BASED_OUTPATIENT_CLINIC_OR_DEPARTMENT_OTHER): Payer: Medicare Other

## 2013-06-22 ENCOUNTER — Ambulatory Visit (HOSPITAL_BASED_OUTPATIENT_CLINIC_OR_DEPARTMENT_OTHER): Payer: Medicare Other | Admitting: Hematology and Oncology

## 2013-06-22 VITALS — BP 151/72 | HR 60 | Temp 97.6°F | Resp 18 | Ht 70.0 in | Wt 176.7 lb

## 2013-06-22 VITALS — BP 155/62 | HR 60 | Temp 97.8°F

## 2013-06-22 DIAGNOSIS — N189 Chronic kidney disease, unspecified: Secondary | ICD-10-CM

## 2013-06-22 DIAGNOSIS — D469 Myelodysplastic syndrome, unspecified: Secondary | ICD-10-CM

## 2013-06-22 DIAGNOSIS — Z8546 Personal history of malignant neoplasm of prostate: Secondary | ICD-10-CM

## 2013-06-22 DIAGNOSIS — N184 Chronic kidney disease, stage 4 (severe): Secondary | ICD-10-CM

## 2013-06-22 DIAGNOSIS — D696 Thrombocytopenia, unspecified: Secondary | ICD-10-CM

## 2013-06-22 DIAGNOSIS — D6959 Other secondary thrombocytopenia: Secondary | ICD-10-CM

## 2013-06-22 DIAGNOSIS — D649 Anemia, unspecified: Secondary | ICD-10-CM

## 2013-06-22 LAB — CBC WITH DIFFERENTIAL/PLATELET
BASO%: 1.6 % (ref 0.0–2.0)
Basophils Absolute: 0.1 10*3/uL (ref 0.0–0.1)
Eosinophils Absolute: 0.3 10*3/uL (ref 0.0–0.5)
HCT: 27.8 % — ABNORMAL LOW (ref 38.4–49.9)
HGB: 9.4 g/dL — ABNORMAL LOW (ref 13.0–17.1)
LYMPH%: 23.7 % (ref 14.0–49.0)
MCHC: 33.9 g/dL (ref 32.0–36.0)
MONO#: 0.9 10*3/uL (ref 0.1–0.9)
NEUT#: 4 10*3/uL (ref 1.5–6.5)
NEUT%: 58.1 % (ref 39.0–75.0)
Platelets: 198 10*3/uL (ref 140–400)
RBC: 2.89 10*6/uL — ABNORMAL LOW (ref 4.20–5.82)
WBC: 6.8 10*3/uL (ref 4.0–10.3)

## 2013-06-22 MED ORDER — EPOETIN ALFA 20000 UNIT/ML IJ SOLN
20000.0000 [IU] | INTRAMUSCULAR | Status: DC
  Administered 2013-06-22: 20000 [IU] via SUBCUTANEOUS
  Filled 2013-06-22: qty 1

## 2013-06-22 NOTE — Patient Instructions (Signed)
Epoetin Alfa injection What is this medicine? EPOETIN ALFA (e POE e tin AL fa) helps your body make more red blood cells. This medicine is used to treat anemia caused by chronic kidney failure, cancer chemotherapy, or HIV-therapy. It may also be used before surgery if you have anemia. This medicine may be used for other purposes; ask your health care provider or pharmacist if you have questions. COMMON BRAND NAME(S): Epogen, Procrit What should I tell my health care provider before I take this medicine? They need to know if you have any of these conditions: -blood clotting disorders -cancer patient not on chemotherapy -cystic fibrosis -heart disease, such as angina or heart failure -hemoglobin level of 12 g/dL or greater -high blood pressure -low levels of folate, iron, or vitamin B12 -seizures -an unusual or allergic reaction to erythropoietin, albumin, benzyl alcohol, hamster proteins, other medicines, foods, dyes, or preservatives -pregnant or trying to get pregnant -breast-feeding How should I use this medicine? This medicine is for injection into a vein or under the skin. It is usually given by a health care professional in a hospital or clinic setting. If you get this medicine at home, you will be taught how to prepare and give this medicine. Use exactly as directed. Take your medicine at regular intervals. Do not take your medicine more often than directed. It is important that you put your used needles and syringes in a special sharps container. Do not put them in a trash can. If you do not have a sharps container, call your pharmacist or healthcare provider to get one. Talk to your pediatrician regarding the use of this medicine in children. While this drug may be prescribed for selected conditions, precautions do apply. Overdosage: If you think you have taken too much of this medicine contact a poison control center or emergency room at once. NOTE: This medicine is only for you. Do  not share this medicine with others. What if I miss a dose? If you miss a dose, take it as soon as you can. If it is almost time for your next dose, take only that dose. Do not take double or extra doses. What may interact with this medicine? Do not take this medicine with any of the following medications: -darbepoetin alfa This list may not describe all possible interactions. Give your health care provider a list of all the medicines, herbs, non-prescription drugs, or dietary supplements you use. Also tell them if you smoke, drink alcohol, or use illegal drugs. Some items may interact with your medicine. What should I watch for while using this medicine? Visit your prescriber or health care professional for regular checks on your progress and for the needed blood tests and blood pressure measurements. It is especially important for the doctor to make sure your hemoglobin level is in the desired range, to limit the risk of potential side effects and to give you the best benefit. Keep all appointments for any recommended tests. Check your blood pressure as directed. Ask your doctor what your blood pressure should be and when you should contact him or her. As your body makes more red blood cells, you may need to take iron, folic acid, or vitamin B supplements. Ask your doctor or health care provider which products are right for you. If you have kidney disease continue dietary restrictions, even though this medication can make you feel better. Talk with your doctor or health care professional about the foods you eat and the vitamins that you take. What   side effects may I notice from receiving this medicine? Side effects that you should report to your doctor or health care professional as soon as possible: -allergic reactions like skin rash, itching or hives, swelling of the face, lips, or tongue -breathing problems -changes in vision -chest pain -confusion, trouble speaking or understanding -feeling  faint or lightheaded, falls -high blood pressure -muscle aches or pains -pain, swelling, warmth in the leg -rapid weight gain -severe headaches -sudden numbness or weakness of the face, arm or leg -trouble walking, dizziness, loss of balance or coordination -seizures (convulsions) -swelling of the ankles, feet, hands -unusually weak or tired Side effects that usually do not require medical attention (report to your doctor or health care professional if they continue or are bothersome): -diarrhea -fever, chills (flu-like symptoms) -headaches -nausea, vomiting -redness, stinging, or swelling at site where injected This list may not describe all possible side effects. Call your doctor for medical advice about side effects. You may report side effects to FDA at 1-800-FDA-1088. Where should I keep my medicine? Keep out of the reach of children. Store in a refrigerator between 2 and 8 degrees C (36 and 46 degrees F). Do not freeze or shake. Throw away any unused portion if using a single-dose vial. Multi-dose vials can be kept in the refrigerator for up to 21 days after the initial dose. Throw away unused medicine. NOTE: This sheet is a summary. It may not cover all possible information. If you have questions about this medicine, talk to your doctor, pharmacist, or health care provider.  2014, Elsevier/Gold Standard. (2008-06-11 10:25:44)  

## 2013-06-22 NOTE — Progress Notes (Signed)
[facility] OFFICE PROGRESS NOTE  Patient Care Team: Catha Gosselin, MD as PCP - General (Family Medicine) Lesleigh Noe, MD (Cardiology) Vertell Novak., MD (Gastroenterology) Lindaann Slough, MD (Urology) Artis Delay, MD as Consulting Physician (Hematology and Oncology)  DIAGNOSIS: Chronic anemia, consistent with myelodysplastic syndrome  SUMMARY OF ONCOLOGIC HISTORY: This is a very pleasant 77 year gentleman accompanied by his wife today. I have reviewed his records extensively and collaborated the history with the patient. This is a very interesting gentleman with a history of prostate cancer diagnosed in 1997, status post radiation and seed implantation and Lupron injection. The patient cannot recall the stage of his disease. He's been getting Lupron injection every 4 months, the next one will be due and if this month. He does not recall his last PSA number but was never told he require urgent attention of further treatment apart from just getting Lupron injection. He was also found to have chronic anemia in the past and has been placed on oral ion supplements 4 years of which he took twice a day. His last ferritin level was over 1300. The patient also has extensive EGD and colonoscopy done which showed no evidence of active bleeding. He had B12 level checked recently and was found to have B12 deficiency and was started on B12 injections. The patient also has significant anemia requiring hospitalization and recent blood transfusion. On 05/10/2013 he had bone marrow aspirate and biopsy which is consistent with myelodysplastic syndrome. We started him on erythropoietin stimulating agents for anemia.  INTERVAL HISTORY: Dennie Fetters Sr. 77 y.o. male returns for further followup. He was admitted to the hospital due to severe hypertension, acute gout and acute renal failure as well as congestive heart failure. He denies any shortness of breath. He has significant leg  swelling. The patient denies any recent signs or symptoms of bleeding such as spontaneous epistaxis, hematuria or hematochezia. He denies any recent fever, chills, night sweats or abnormal weight loss  I have reviewed the past medical history, past surgical history, social history and family history with the patient and they are unchanged from previous note.  ALLERGIES:  is allergic to ace inhibitors and ibuprofen.  MEDICATIONS:  Current Outpatient Prescriptions  Medication Sig Dispense Refill  . acetaminophen (ARTHRITIS PAIN RELIEF) 650 MG CR tablet Take 1,300 mg by mouth 2 (two) times daily.       Marland Kitchen amiodarone (PACERONE) 200 MG tablet Take 100 mg by mouth daily.      Marland Kitchen amLODipine (NORVASC) 10 MG tablet Take 10 mg by mouth daily.      Marland Kitchen aspirin 325 MG tablet Take 325 mg by mouth daily.      Marland Kitchen atorvastatin (LIPITOR) 80 MG tablet Take 80 mg by mouth daily.      Marland Kitchen CALCIUM PO Take 2,000 mg by mouth daily.       . carvedilol (COREG) 25 MG tablet Take 1 tablet (25 mg total) by mouth 2 (two) times daily with a meal.  60 tablet  11  . cholecalciferol (VITAMIN D) 1000 UNITS tablet Take 1,000 Units by mouth daily.       . cloNIDine (CATAPRES) 0.2 MG tablet Take 0.2 mg by mouth 2 (two) times daily.       . clopidogrel (PLAVIX) 75 MG tablet Take 75 mg by mouth daily.      . colchicine 0.6 MG tablet Take 1 tablet (0.6 mg total) by mouth daily.  30 tablet  11  .  Cyanocobalamin (VITAMIN B-12 IJ) Inject as directed.      . furosemide (LASIX) 80 MG tablet Take 1.5 tablets (120 mg total) by mouth 2 (two) times daily.      Marland Kitchen glucose blood test strip       . hydrALAZINE (APRESOLINE) 50 MG tablet Take 1 tablet (50 mg total) by mouth 3 (three) times daily.  90 tablet  11  . isosorbide mononitrate (IMDUR) 120 MG 24 hr tablet Take 1 tablet (120 mg total) by mouth daily.  30 tablet  11  . pantoprazole (PROTONIX) 40 MG tablet Take 40 mg by mouth daily.      . potassium chloride SA (K-DUR,KLOR-CON) 20 MEQ tablet  Take 1 tablet (20 mEq total) by mouth daily.      . silodosin (RAPAFLO) 8 MG CAPS capsule Take 8 mg by mouth daily.        No current facility-administered medications for this visit.   Facility-Administered Medications Ordered in Other Visits  Medication Dose Route Frequency Provider Last Rate Last Dose  . epoetin alfa (EPOGEN,PROCRIT) injection 20,000 Units  20,000 Units Subcutaneous Weekly Artis Delay, MD   20,000 Units at 06/22/13 1416    REVIEW OF SYSTEMS:   Constitutional: Denies fevers, chills or abnormal weight loss Eyes: Denies blurriness of vision Ears, nose, mouth, throat, and face: Denies mucositis or sore throat Gastrointestinal:  Denies nausea, heartburn or change in bowel habits Skin: Denies abnormal skin rashes Lymphatics: Denies new lymphadenopathy or easy bruising Neurological:Denies numbness, tingling or new weaknesses Behavioral/Psych: Mood is stable, no new changes  All other systems were reviewed with the patient and are negative.  PHYSICAL EXAMINATION: ECOG PERFORMANCE STATUS: 1 - Symptomatic but completely ambulatory  Filed Vitals:   06/22/13 1427  BP: 151/72  Pulse: 60  Temp: 97.6 F (36.4 C)  Resp: 18   Filed Weights   06/22/13 1427  Weight: 176 lb 11.2 oz (80.151 kg)    GENERAL:alert, no distress and comfortable SKIN: skin color, texture, turgor are normal, no rashes or significant lesions EYES: normal, Conjunctiva are pink and non-injected, sclera clear OROPHARYNX:no exudate, no erythema and lips, buccal mucosa, and tongue normal  NECK: supple, thyroid normal size, non-tender, without nodularity LYMPH:  no palpable lymphadenopathy in the cervical, axillary or inguinal LUNGS: clear to auscultation and percussion with normal breathing effort HEART: regular rate & rhythm and no murmurs. He has significant bilateral lower extremity edema ABDOMEN:abdomen soft, non-tender and normal bowel sounds Musculoskeletal:no cyanosis of digits and no clubbing   NEURO: alert & oriented x 3 with fluent speech, no focal motor/sensory deficits  LABORATORY DATA:  I have reviewed the data as listed    Component Value Date/Time   NA 142 06/20/2013 1242   NA 147* 04/24/2013 1211   K 3.2* 06/20/2013 1242   K 3.1* 04/24/2013 1211   CL 104 06/20/2013 1242   CO2 27 06/20/2013 1242   CO2 27 04/24/2013 1211   GLUCOSE 124* 06/20/2013 1242   GLUCOSE 95 04/24/2013 1211   BUN 45* 06/20/2013 1242   BUN 26.1* 04/24/2013 1211   CREATININE 2.8* 06/20/2013 1242   CREATININE 2.2* 04/24/2013 1211   CALCIUM 9.1 06/20/2013 1242   CALCIUM 9.2 04/24/2013 1211   PROT 6.0 06/06/2013 0225   PROT 7.1 04/24/2013 1211   ALBUMIN 2.3* 06/12/2013 0542   ALBUMIN 3.5 04/24/2013 1211   AST 21 06/06/2013 0225   AST 16 04/24/2013 1211   ALT 16 06/06/2013 0225   ALT 8 04/24/2013 1211  ALKPHOS 63 06/06/2013 0225   ALKPHOS 39* 04/24/2013 1211   BILITOT 0.3 06/06/2013 0225   BILITOT 0.32 04/24/2013 1211   GFRNONAA 19* 06/14/2013 0535   GFRAA 22* 06/14/2013 0535    No results found for this basename: SPEP,  UPEP,   kappa and lambda light chains    Lab Results  Component Value Date   WBC 6.8 06/22/2013   NEUTROABS 4.0 06/22/2013   HGB 9.4* 06/22/2013   HCT 27.8* 06/22/2013   MCV 96.0 06/22/2013   PLT 198 06/22/2013      Chemistry      Component Value Date/Time   NA 142 06/20/2013 1242   NA 147* 04/24/2013 1211   K 3.2* 06/20/2013 1242   K 3.1* 04/24/2013 1211   CL 104 06/20/2013 1242   CO2 27 06/20/2013 1242   CO2 27 04/24/2013 1211   BUN 45* 06/20/2013 1242   BUN 26.1* 04/24/2013 1211   CREATININE 2.8* 06/20/2013 1242   CREATININE 2.2* 04/24/2013 1211      Component Value Date/Time   CALCIUM 9.1 06/20/2013 1242   CALCIUM 9.2 04/24/2013 1211   ALKPHOS 63 06/06/2013 0225   ALKPHOS 39* 04/24/2013 1211   AST 21 06/06/2013 0225   AST 16 04/24/2013 1211   ALT 16 06/06/2013 0225   ALT 8 04/24/2013 1211   BILITOT 0.3 06/06/2013 0225   BILITOT 0.32  04/24/2013 1211      ASSESSMENT & PLAN:  #1 myelodysplastic syndrome His most recent bone marrow aspirate and biopsy is consistent with myelodysplastic syndrome. His calculated IPS test score is 0. Prognosis is still reasonably well. The patient was very anemic. I suspect a Lupron injection might play a role. I recommend erythropoietin stimulating agents.  Due to recent renal failure, exacerbation of congestive heart failure and hypertension, I recommend we hold further treatment for at least one month. #2 thrombocytopenia This is due to his MDS. He will continue B12 injection. It has improved. The patient can continue on current antiplatelet agents as long as his platelet count remained over 50,000 #3 history of prostate cancer PSA is at 3. There is no evidence of prostate cancer in his bone marrow. He'll continue on Lupron injection under the direction of his urologist #4 chronic kidney disease He will continue followup with his nephrologist. #5 hypertension #6 recent congestive heart failure He is currently on high-dose diuretics. I will defer to his primary cardiologist and primary care provider for medication adjustment. Orders Placed This Encounter  Procedures  . Comprehensive metabolic panel    Standing Status: Future     Number of Occurrences:      Standing Expiration Date: 06/22/2014   All questions were answered. The patient knows to call the clinic with any problems, questions or concerns. No barriers to learning was detected.    [facility], Malissa Slay, MD 06/22/2013 3:04 PM

## 2013-06-22 NOTE — Telephone Encounter (Signed)
appts made per 12/12 POF AVS and CAL given shh °

## 2013-06-28 ENCOUNTER — Other Ambulatory Visit (INDEPENDENT_AMBULATORY_CARE_PROVIDER_SITE_OTHER): Payer: Medicare Other

## 2013-06-28 DIAGNOSIS — N184 Chronic kidney disease, stage 4 (severe): Secondary | ICD-10-CM

## 2013-06-28 DIAGNOSIS — I5033 Acute on chronic diastolic (congestive) heart failure: Secondary | ICD-10-CM

## 2013-06-28 DIAGNOSIS — D469 Myelodysplastic syndrome, unspecified: Secondary | ICD-10-CM

## 2013-06-28 LAB — BASIC METABOLIC PANEL
BUN: 31 mg/dL — ABNORMAL HIGH (ref 6–23)
CO2: 24 mEq/L (ref 19–32)
Calcium: 8.9 mg/dL (ref 8.4–10.5)
Chloride: 106 mEq/L (ref 96–112)
Creatinine, Ser: 2.9 mg/dL — ABNORMAL HIGH (ref 0.4–1.5)
Glucose, Bld: 134 mg/dL — ABNORMAL HIGH (ref 70–99)
Potassium: 3.9 mEq/L (ref 3.5–5.1)
Sodium: 140 mEq/L (ref 135–145)

## 2013-07-03 ENCOUNTER — Telehealth: Payer: Self-pay

## 2013-07-03 NOTE — Telephone Encounter (Signed)
pt given lab results.  Let him know that labs are stable and ask if fluid retention is better?pt sts that his fluid retension has improved., he still has some swelling during the day, but that improves at night.pt instructed to call the office if he has increased swelling or shortness of breath. Pt verbalized understanding

## 2013-07-03 NOTE — Telephone Encounter (Signed)
Message copied by Jarvis Newcomer on Tue Jul 03, 2013  1:04 PM ------      Message from: Verdis Prime      Created: Thu Jun 28, 2013  4:47 PM       Let him know that labs are stable and ask if fluid retention is better? ------

## 2013-07-04 ENCOUNTER — Telehealth: Payer: Self-pay

## 2013-07-04 NOTE — Telephone Encounter (Signed)
Homehealth certificate and plan of care faxed to [facility] [phone]

## 2013-07-13 ENCOUNTER — Encounter: Payer: Self-pay | Admitting: *Deleted

## 2013-07-13 ENCOUNTER — Other Ambulatory Visit (HOSPITAL_COMMUNITY): Payer: Self-pay | Admitting: Surgery

## 2013-07-19 ENCOUNTER — Other Ambulatory Visit: Payer: Self-pay

## 2013-07-19 MED ORDER — FUROSEMIDE 80 MG PO TABS: 120.0000 mg | ORAL_TABLET | Freq: Two times a day (BID) | ORAL | Status: DC

## 2013-07-31 ENCOUNTER — Ambulatory Visit: Payer: Medicare Other | Admitting: Interventional Cardiology

## 2013-08-10 ENCOUNTER — Other Ambulatory Visit (HOSPITAL_BASED_OUTPATIENT_CLINIC_OR_DEPARTMENT_OTHER): Payer: Commercial Managed Care - HMO

## 2013-08-10 ENCOUNTER — Ambulatory Visit (HOSPITAL_BASED_OUTPATIENT_CLINIC_OR_DEPARTMENT_OTHER): Payer: Medicare HMO | Admitting: Hematology and Oncology

## 2013-08-10 ENCOUNTER — Other Ambulatory Visit: Payer: Medicare Other

## 2013-08-10 ENCOUNTER — Ambulatory Visit: Payer: Medicare Other

## 2013-08-10 ENCOUNTER — Telehealth: Payer: Self-pay | Admitting: Hematology and Oncology

## 2013-08-10 ENCOUNTER — Encounter: Payer: Self-pay | Admitting: Hematology and Oncology

## 2013-08-10 VITALS — BP 146/62 | HR 59 | Temp 98.2°F | Resp 20 | Ht 70.0 in | Wt 178.5 lb

## 2013-08-10 DIAGNOSIS — I10 Essential (primary) hypertension: Secondary | ICD-10-CM

## 2013-08-10 DIAGNOSIS — D649 Anemia, unspecified: Secondary | ICD-10-CM

## 2013-08-10 DIAGNOSIS — D469 Myelodysplastic syndrome, unspecified: Secondary | ICD-10-CM

## 2013-08-10 DIAGNOSIS — Z8546 Personal history of malignant neoplasm of prostate: Secondary | ICD-10-CM

## 2013-08-10 DIAGNOSIS — N184 Chronic kidney disease, stage 4 (severe): Secondary | ICD-10-CM

## 2013-08-10 DIAGNOSIS — I509 Heart failure, unspecified: Secondary | ICD-10-CM

## 2013-08-10 DIAGNOSIS — D696 Thrombocytopenia, unspecified: Secondary | ICD-10-CM

## 2013-08-10 DIAGNOSIS — N189 Chronic kidney disease, unspecified: Secondary | ICD-10-CM

## 2013-08-10 LAB — COMPREHENSIVE METABOLIC PANEL (CC13)
ALT: 8 U/L (ref 0–55)
AST: 14 U/L (ref 5–34)
Albumin: 3.8 g/dL (ref 3.5–5.0)
Alkaline Phosphatase: 47 U/L (ref 40–150)
Anion Gap: 12 mEq/L — ABNORMAL HIGH (ref 3–11)
BUN: 40.3 mg/dL — ABNORMAL HIGH (ref 7.0–26.0)
CO2: 27 mEq/L (ref 22–29)
Calcium: 9.4 mg/dL (ref 8.4–10.4)
Chloride: 108 mEq/L (ref 98–109)
Creatinine: 2.6 mg/dL — ABNORMAL HIGH (ref 0.7–1.3)
Glucose: 105 mg/dl (ref 70–140)
Potassium: 3.9 mEq/L (ref 3.5–5.1)
Sodium: 146 mEq/L — ABNORMAL HIGH (ref 136–145)
Total Bilirubin: 0.33 mg/dL (ref 0.20–1.20)
Total Protein: 7 g/dL (ref 6.4–8.3)

## 2013-08-10 LAB — CBC WITH DIFFERENTIAL/PLATELET
BASO%: 1.3 % (ref 0.0–2.0)
Basophils Absolute: 0.1 10*3/uL (ref 0.0–0.1)
EOS%: 3.8 % (ref 0.0–7.0)
Eosinophils Absolute: 0.2 10*3/uL (ref 0.0–0.5)
HCT: 23.3 % — ABNORMAL LOW (ref 38.4–49.9)
HGB: 7.7 g/dL — ABNORMAL LOW (ref 13.0–17.1)
LYMPH#: 1 10*3/uL (ref 0.9–3.3)
LYMPH%: 18.5 % (ref 14.0–49.0)
MCH: 33.3 pg (ref 27.2–33.4)
MCHC: 33.1 g/dL (ref 32.0–36.0)
MCV: 100.8 fL — ABNORMAL HIGH (ref 79.3–98.0)
MONO#: 0.6 10*3/uL (ref 0.1–0.9)
MONO%: 10.5 % (ref 0.0–14.0)
NEUT#: 3.7 10*3/uL (ref 1.5–6.5)
NEUT%: 65.9 % (ref 39.0–75.0)
Platelets: 99 10*3/uL — ABNORMAL LOW (ref 140–400)
RBC: 2.31 10*6/uL — ABNORMAL LOW (ref 4.20–5.82)
RDW: 17.8 % — ABNORMAL HIGH (ref 11.0–14.6)
WBC: 5.6 10*3/uL (ref 4.0–10.3)

## 2013-08-10 MED ORDER — EPOETIN ALFA 20000 UNIT/ML IJ SOLN
20000.0000 [IU] | INTRAMUSCULAR | Status: DC
  Administered 2013-08-10: 20000 [IU] via SUBCUTANEOUS
  Filled 2013-08-10: qty 1

## 2013-08-10 NOTE — Telephone Encounter (Signed)
Gave pt appt for lab,injections and MD for February 2015

## 2013-08-10 NOTE — Progress Notes (Signed)
[facility] OFFICE PROGRESS NOTE  [name], MD DIAGNOSIS:  Chronic anemia, thrombocytopenia consistent with myelodysplastic syndrome  SUMMARY OF HEMATOLOGIC HISTORY: This is a very interesting gentleman with a history of prostate cancer diagnosed in 1997, status post radiation and seed implantation and Lupron injection. The patient cannot recall the stage of his disease. He's been getting Lupron injection every 4 months, the next one will be due and if this month. He does not recall his last PSA number but was never told he require urgent attention of further treatment apart from just getting Lupron injection. He was also found to have chronic anemia in the past and has been placed on oral ion supplements 4 years of which he took twice a day. His last ferritin level was over 1300. The patient also has extensive EGD and colonoscopy done which showed no evidence of active bleeding. He had B12 level checked recently and was found to have B12 deficiency and was started on B12 injections. The patient also has significant anemia requiring hospitalization and recent blood transfusion. On 05/10/2013 he had bone marrow aspirate and biopsy which is consistent with myelodysplastic syndrome. We started him on erythropoietin stimulating agents for anemia.  INTERVAL HISTORY: [name] 78 y.o. male returns for further followup. For some reason, he has not received any further Procrit injection since 5 weeks ago.  The patient has severe hypertension, acute renal failure and congestive heart failure and is treated aggressively recently with diuretics He complained of some profound fatigue. The patient denies any recent signs or symptoms of bleeding such as spontaneous epistaxis, hematuria or hematochezia.  I have reviewed the past medical history, past surgical history, social history and family history with the patient and they are unchanged from previous note.  ALLERGIES:   is allergic to ace inhibitors and ibuprofen.  MEDICATIONS:  Current Outpatient Prescriptions  Medication Sig Dispense Refill  . acetaminophen (ARTHRITIS PAIN RELIEF) 650 MG CR tablet Take 1,300 mg by mouth 2 (two) times daily.       Marland Kitchen amiodarone (PACERONE) 200 MG tablet Take 100 mg by mouth daily.      Marland Kitchen amLODipine (NORVASC) 10 MG tablet Take 10 mg by mouth daily.      Marland Kitchen aspirin 325 MG tablet Take 325 mg by mouth daily.      Marland Kitchen atorvastatin (LIPITOR) 80 MG tablet Take 80 mg by mouth daily.      Marland Kitchen CALCIUM PO Take 2,000 mg by mouth daily.       . carvedilol (COREG) 25 MG tablet Take 1 tablet (25 mg total) by mouth 2 (two) times daily with a meal.  60 tablet  11  . cholecalciferol (VITAMIN D) 1000 UNITS tablet Take 1,000 Units by mouth daily.       . cloNIDine (CATAPRES) 0.2 MG tablet Take 0.2 mg by mouth 2 (two) times daily.       . clopidogrel (PLAVIX) 75 MG tablet TAKE ONE TABLET BY MOUTH ONCE DAILY  30 tablet  0  . Cyanocobalamin (VITAMIN B-12 IJ) Inject as directed.      . furosemide (LASIX) 80 MG tablet Take 1.5 tablets (120 mg total) by mouth 2 (two) times daily.  90 tablet  6  . glucose blood test strip       . hydrALAZINE (APRESOLINE) 50 MG tablet Take 1 tablet (50 mg total) by mouth 3 (three) times daily.  90 tablet  11  . isosorbide mononitrate (IMDUR) 120 MG 24 hr  tablet Take 1 tablet (120 mg total) by mouth daily.  30 tablet  11  . pantoprazole (PROTONIX) 40 MG tablet Take 40 mg by mouth daily.      . potassium chloride SA (K-DUR,KLOR-CON) 20 MEQ tablet Take 20 mEq by mouth 2 (two) times daily.      . silodosin (RAPAFLO) 8 MG CAPS capsule Take 8 mg by mouth daily.        Current Facility-Administered Medications  Medication Dose Route Frequency Provider Last Rate Last Dose  . epoetin alfa (EPOGEN,PROCRIT) injection 20,000 Units  20,000 Units Subcutaneous Weekly Heath Lark, MD   20,000 Units at 08/10/13 1412     REVIEW OF SYSTEMS:   Constitutional: Denies fevers, chills or night  sweats Eyes: Denies blurriness of vision Ears, nose, mouth, throat, and face: Denies mucositis or sore throat Respiratory: Denies cough, dyspnea or wheezes Cardiovascular: Denies palpitation, chest discomfort or lower extremity swelling Gastrointestinal:  Denies nausea, heartburn or change in bowel habits Skin: Denies abnormal skin rashes Lymphatics: Denies new lymphadenopathy or easy bruising Neurological:Denies numbness, tingling or new weaknesses Behavioral/Psych: Mood is stable, no new changes  All other systems were reviewed with the patient and are negative.  PHYSICAL EXAMINATION: ECOG PERFORMANCE STATUS: 1 - Symptomatic but completely ambulatory  Filed Vitals:   08/10/13 1344  BP: 146/62  Pulse: 59  Temp: 98.2 F (36.8 C)  Resp: 20   Filed Weights   08/10/13 1344  Weight: 178 lb 8 oz (80.967 kg)    GENERAL:alert, no distress and comfortable SKIN: skin color, texture, turgor are normal, no rashes or significant lesions EYES: normal, Conjunctiva are pink and non-injected, sclera clear OROPHARYNX:no exudate, no erythema and lips, buccal mucosa, and tongue normal  NECK: supple, thyroid normal size, non-tender, without nodularity LYMPH:  no palpable lymphadenopathy in the cervical, axillary or inguinal LUNGS: clear to auscultation and percussion with normal breathing effort HEART: regular rate & rhythm and no murmurs and no lower extremity edema ABDOMEN:abdomen soft, non-tender and normal bowel sounds Musculoskeletal:no cyanosis of digits and no clubbing  NEURO: alert & oriented x 3 with fluent speech, no focal motor/sensory deficits  LABORATORY DATA:  I have reviewed the data as listed Results for orders placed in visit on 08/10/13 (from the past 48 hour(s))  CBC WITH DIFFERENTIAL     Status: Abnormal   Collection Time    08/10/13  1:24 PM      Result Value Range   WBC 5.6  4.0 - 10.3 10e3/uL   NEUT# 3.7  1.5 - 6.5 10e3/uL   HGB 7.7 (*) 13.0 - 17.1 g/dL   HCT 23.3  (*) 38.4 - 49.9 %   Platelets 99 (*) 140 - 400 10e3/uL   MCV 100.8 (*) 79.3 - 98.0 fL   MCH 33.3  27.2 - 33.4 pg   MCHC 33.1  32.0 - 36.0 g/dL   RBC 2.31 (*) 4.20 - 5.82 10e6/uL   RDW 17.8 (*) 11.0 - 14.6 %   lymph# 1.0  0.9 - 3.3 10e3/uL   MONO# 0.6  0.1 - 0.9 10e3/uL   Eosinophils Absolute 0.2  0.0 - 0.5 10e3/uL   Basophils Absolute 0.1  0.0 - 0.1 10e3/uL   NEUT% 65.9  39.0 - 75.0 %   LYMPH% 18.5  14.0 - 49.0 %   MONO% 10.5  0.0 - 14.0 %   EOS% 3.8  0.0 - 7.0 %   BASO% 1.3  0.0 - 2.0 %    Lab Results  Component Value Date   WBC 5.6 08/10/2013   HGB 7.7* 08/10/2013   HCT 23.3* 08/10/2013   MCV 100.8* 08/10/2013   PLT 99* 08/10/2013   ASSESSMENT & PLAN:  #1 severe anemia #2 myelodysplastic syndrome #3 congestive heart failure This is likely anemia of chronic disease, as well as related to his myelodysplastic syndrome. The patient denies recent history of bleeding such as epistaxis, hematuria or hematochezia. He is asymptomatic from the anemia. We will observe for now.  He does not require transfusion now.  I plan to modify his Procrit injection to 20,000 units weekly with goal to keep his hemoglobin greater than 10 g. I am scheduling him to come back for blood work every week If his blood count dropped to less than 7 all the patient has more symptoms of anemia, we will consider blood transfusions. #4 chronic kidney disease This is one of the major contributing factor as well to his anemia. His kidney function is currently stable #5 thrombocytopenia This is also related to his myelodysplastic syndrome. We will observe for now #6 hypertension His blood pressure is better today. We will monitor his blood pressure carefully while he is on erythropoietin stimulating agents. #7 history of prostate cancer His recent PSA is at 3. He will continue Lupron injection under the direction of his urologist. All questions were answered. The patient knows to call the clinic with any problems,  questions or concerns. No barriers to learning was detected.  [place], [place], MD 08/10/2013 2:50 PM

## 2013-08-13 ENCOUNTER — Ambulatory Visit: Payer: Medicare Other | Admitting: Surgery

## 2013-08-13 ENCOUNTER — Other Ambulatory Visit: Payer: Self-pay | Admitting: *Deleted

## 2013-08-13 ENCOUNTER — Other Ambulatory Visit (HOSPITAL_COMMUNITY): Payer: Medicare Other

## 2013-08-13 MED ORDER — FUROSEMIDE 80 MG PO TABS: 120.0000 mg | ORAL_TABLET | Freq: Two times a day (BID) | ORAL | Status: DC

## 2013-08-17 ENCOUNTER — Other Ambulatory Visit: Payer: Self-pay | Admitting: Hematology and Oncology

## 2013-08-17 ENCOUNTER — Other Ambulatory Visit (HOSPITAL_BASED_OUTPATIENT_CLINIC_OR_DEPARTMENT_OTHER): Payer: Medicare HMO

## 2013-08-17 ENCOUNTER — Ambulatory Visit (HOSPITAL_BASED_OUTPATIENT_CLINIC_OR_DEPARTMENT_OTHER): Payer: Medicare HMO

## 2013-08-17 VITALS — BP 155/68 | HR 60 | Temp 97.5°F

## 2013-08-17 DIAGNOSIS — N184 Chronic kidney disease, stage 4 (severe): Secondary | ICD-10-CM

## 2013-08-17 DIAGNOSIS — N189 Chronic kidney disease, unspecified: Secondary | ICD-10-CM

## 2013-08-17 DIAGNOSIS — D469 Myelodysplastic syndrome, unspecified: Secondary | ICD-10-CM

## 2013-08-17 DIAGNOSIS — D696 Thrombocytopenia, unspecified: Secondary | ICD-10-CM

## 2013-08-17 DIAGNOSIS — D649 Anemia, unspecified: Secondary | ICD-10-CM

## 2013-08-17 LAB — CBC WITH DIFFERENTIAL/PLATELET
BASO%: 1.3 % (ref 0.0–2.0)
Basophils Absolute: 0.1 10*3/uL (ref 0.0–0.1)
EOS%: 7.4 % — AB (ref 0.0–7.0)
Eosinophils Absolute: 0.6 10*3/uL — ABNORMAL HIGH (ref 0.0–0.5)
HCT: 24.7 % — ABNORMAL LOW (ref 38.4–49.9)
HEMOGLOBIN: 8.3 g/dL — AB (ref 13.0–17.1)
LYMPH#: 1 10*3/uL (ref 0.9–3.3)
LYMPH%: 13 % — ABNORMAL LOW (ref 14.0–49.0)
MCH: 33.9 pg — AB (ref 27.2–33.4)
MCHC: 33.5 g/dL (ref 32.0–36.0)
MCV: 101.3 fL — ABNORMAL HIGH (ref 79.3–98.0)
MONO#: 1 10*3/uL — ABNORMAL HIGH (ref 0.1–0.9)
MONO%: 12.7 % (ref 0.0–14.0)
NEUT#: 5 10*3/uL (ref 1.5–6.5)
NEUT%: 65.6 % (ref 39.0–75.0)
Platelets: 110 10*3/uL — ABNORMAL LOW (ref 140–400)
RBC: 2.44 10*6/uL — ABNORMAL LOW (ref 4.20–5.82)
RDW: 18.9 % — AB (ref 11.0–14.6)
WBC: 7.6 10*3/uL (ref 4.0–10.3)

## 2013-08-17 LAB — HOLD TUBE, BLOOD BANK

## 2013-08-17 MED ORDER — EPOETIN ALFA 20000 UNIT/ML IJ SOLN
20000.0000 [IU] | INTRAMUSCULAR | Status: DC
  Administered 2013-08-17: 20000 [IU] via SUBCUTANEOUS
  Filled 2013-08-17: qty 1

## 2013-08-21 ENCOUNTER — Ambulatory Visit (INDEPENDENT_AMBULATORY_CARE_PROVIDER_SITE_OTHER): Payer: Medicare HMO | Admitting: Interventional Cardiology

## 2013-08-21 ENCOUNTER — Encounter: Payer: Self-pay | Admitting: Interventional Cardiology

## 2013-08-21 VITALS — BP 141/62 | HR 59 | Ht 70.0 in | Wt 175.0 lb

## 2013-08-21 DIAGNOSIS — I2510 Atherosclerotic heart disease of native coronary artery without angina pectoris: Secondary | ICD-10-CM

## 2013-08-21 DIAGNOSIS — I480 Paroxysmal atrial fibrillation: Secondary | ICD-10-CM

## 2013-08-21 DIAGNOSIS — I4891 Unspecified atrial fibrillation: Secondary | ICD-10-CM

## 2013-08-21 DIAGNOSIS — N184 Chronic kidney disease, stage 4 (severe): Secondary | ICD-10-CM

## 2013-08-21 DIAGNOSIS — I5032 Chronic diastolic (congestive) heart failure: Secondary | ICD-10-CM

## 2013-08-21 DIAGNOSIS — I10 Essential (primary) hypertension: Secondary | ICD-10-CM

## 2013-08-21 DIAGNOSIS — Z79899 Other long term (current) drug therapy: Secondary | ICD-10-CM | POA: Insufficient documentation

## 2013-08-21 MED ORDER — CLOPIDOGREL BISULFATE 75 MG PO TABS: ORAL_TABLET | ORAL | Status: DC

## 2013-08-21 NOTE — Progress Notes (Signed)
Patient ID: [name] Brow., male   DOB: 1936-01-19, 78 y.o.   MRN: [id number]    [address] [place], [place]  [address] Phone: [phone] Fax:  [phone]  Date:  08/21/2013   ID:  [name] RAYOS Sr., DOB 04/07/1936, MRN [id number]  PCP:  [name], MD   ASSESSMENT:  1. Chronic diastolic heart failure 2. Paroxysmal atrial fibrillation 3. Chronic amiodarone therapy 4. Coronary artery disease without angina  PLAN:  1. Refill Plavix 2. Continue current medical regimen 3. Clinical followup in 6 months. 4. TSH, hepatic panel, and chest x-ray on return visit to follow for amiodarone toxicity   SUBJECTIVE: [name]. is a 78 y.o. male who returns today in followup of chronic diastolic heart failure and paroxysmal atrial fibrillation. He has coronary disease as well and has not experienced angina. He weighs daily. They can be as much is a 2 pound swaying in his weights over 24 hours. Despite these occasional swings in weight he has been relatively stable at around 175 pounds over the past several months and certainly since his last hospitalization. He denies orthopnea. Lower extremity swelling has persisted but is much less than previous visit. He has not had tachycardia or syncope. He has not needed nitroglycerin.  He avoided his last visit with Dr. Trula Slade because he did not have the co-pay.  Wt Readings from Last 3 Encounters:  08/21/13 175 lb (79.379 kg)  08/10/13 178 lb 8 oz (80.967 kg)  06/22/13 176 lb 11.2 oz (80.151 kg)     Past Medical History  Diagnosis Date  . Diabetes mellitus   . CAD (coronary artery disease)     s/b CABG '94  . Hypertension   . Anemia     Macrocytic  . Peripheral vascular disease   . Hyperlipidemia   . Cancer     prostate  . Ulcer   . GERD (gastroesophageal reflux disease)   . Liddle's syndrome   . Hemorrhoids   . Intestinal ischemia   . DVT (deep venous thrombosis)   . Atrial fibrillation    Paroxysmal. S/p PPM  . CHF (congestive heart failure)   . Prostate cancer 1997    XRT and lupron  . Anemia, unspecified 04/24/2013  . Thrombocytopenia, unspecified 04/24/2013  . MDS (myelodysplastic syndrome) 05/22/2013    Current Outpatient Prescriptions  Medication Sig Dispense Refill  . acetaminophen (ARTHRITIS PAIN RELIEF) 650 MG CR tablet Take 1,300 mg by mouth 2 (two) times daily.       Marland Kitchen amiodarone (PACERONE) 200 MG tablet Take 100 mg by mouth daily.      Marland Kitchen amLODipine (NORVASC) 10 MG tablet Take 10 mg by mouth daily.      Marland Kitchen aspirin 325 MG tablet Take 325 mg by mouth daily.      Marland Kitchen atorvastatin (LIPITOR) 80 MG tablet Take 80 mg by mouth daily.      Marland Kitchen CALCIUM PO Take 2,000 mg by mouth daily.       . carvedilol (COREG) 25 MG tablet Take 1 tablet (25 mg total) by mouth 2 (two) times daily with a meal.  60 tablet  11  . cholecalciferol (VITAMIN D) 1000 UNITS tablet Take 1,000 Units by mouth daily.       . cloNIDine (CATAPRES) 0.2 MG tablet Take 0.2 mg by mouth 2 (two) times daily.       . clopidogrel (PLAVIX) 75 MG tablet TAKE ONE TABLET BY MOUTH ONCE DAILY  30 tablet  0  . Cyanocobalamin (VITAMIN B-12 IJ) Inject as directed.      . furosemide (LASIX) 80 MG tablet Take 1.5 tablets (120 mg total) by mouth 2 (two) times daily.  90 tablet  1  . glucose blood test strip       . hydrALAZINE (APRESOLINE) 50 MG tablet Take 1 tablet (50 mg total) by mouth 3 (three) times daily.  90 tablet  11  . isosorbide mononitrate (IMDUR) 120 MG 24 hr tablet Take 1 tablet (120 mg total) by mouth daily.  30 tablet  11  . pantoprazole (PROTONIX) 40 MG tablet Take 40 mg by mouth daily.      . potassium chloride SA (K-DUR,KLOR-CON) 20 MEQ tablet Take 20 mEq by mouth 2 (two) times daily.      . silodosin (RAPAFLO) 8 MG CAPS capsule Take 8 mg by mouth daily.        No current facility-administered medications for this visit.    Allergies:    Allergies  Allergen Reactions  . Ace Inhibitors Cough  .  Ibuprofen Other (See Comments)    GI Issues    Social History:  The patient  reports that he quit smoking about 36 years ago. His smoking use included Cigarettes. He smoked 0.00 packs per day. He has never used smokeless tobacco. He reports that he does not drink alcohol or use illicit drugs.   ROS:  Please see the history of present illness.   He denies postprandial abdominal pain. There is right lower extremity discomfort with exertion. He is compliant with his medical regimen.   All other systems reviewed and negative.   OBJECTIVE: VS:  BP 141/62  Pulse 59  Ht 5\' 10"  (1.778 m)  Wt 175 lb (79.379 kg)  BMI 25.11 kg/m2 Well nourished, well developed, in no acute distress, appearing older than stated age 40: normal Neck: JVD flat. Carotid bruit bilateral bruits  Cardiac:  normal S1, S2; RRR; no murmur. There is a 2 of 6 systolic crescendo decrescendo right upper sternal border murmur   Lungs:  clear to auscultation bilaterally, no wheezing, rhonchi or rales Abd: soft, nontender, no hepatomegaly Ext: Edema  2+ edema from the ankles to the midshin bilaterally . Pulses 2+ popliteal pulses. Nonpalpable pedal pulses.  Skin: warm and dry Neuro:  CNs 2-12 intact, no focal abnormalities noted  EKG:  Not performed       Signed, Illene Labrador III, MD 08/21/2013 11:28 AM

## 2013-08-21 NOTE — Patient Instructions (Signed)
Your physician recommends that you continue on your current medications as directed. Please refer to the Current Medication list given to you today.  A refill for Plavix 75mg   Has been sent to your pharmacy  Your physician wants you to follow-up in: 6 months You will receive a reminder letter in the mail two months in advance. If you don't receive a letter, please call our office to schedule the follow-up appointment.  Your physician recommends that you return for lab work in: 6 months Tsh, [place], and a Chest Xray

## 2013-08-22 ENCOUNTER — Ambulatory Visit (INDEPENDENT_AMBULATORY_CARE_PROVIDER_SITE_OTHER): Payer: Medicare HMO | Admitting: Internal Medicine

## 2013-08-22 ENCOUNTER — Encounter: Payer: Self-pay | Admitting: Internal Medicine

## 2013-08-22 VITALS — BP 132/62 | HR 60 | Ht 70.0 in | Wt 174.0 lb

## 2013-08-22 DIAGNOSIS — I472 Ventricular tachycardia: Secondary | ICD-10-CM

## 2013-08-22 DIAGNOSIS — I495 Sick sinus syndrome: Secondary | ICD-10-CM

## 2013-08-22 DIAGNOSIS — I10 Essential (primary) hypertension: Secondary | ICD-10-CM

## 2013-08-22 DIAGNOSIS — I4891 Unspecified atrial fibrillation: Secondary | ICD-10-CM

## 2013-08-22 DIAGNOSIS — I480 Paroxysmal atrial fibrillation: Secondary | ICD-10-CM

## 2013-08-22 DIAGNOSIS — I4729 Other ventricular tachycardia: Secondary | ICD-10-CM

## 2013-08-22 DIAGNOSIS — Z950 Presence of cardiac pacemaker: Secondary | ICD-10-CM

## 2013-08-22 NOTE — Patient Instructions (Signed)
Your physician wants you to follow-up in: 6 months in device clinic and 12 months with Dr. Allred. You will receive a reminder letter in the mail two months in advance. If you don't receive a letter, please call our office to schedule the follow-up appointment.     

## 2013-08-24 ENCOUNTER — Other Ambulatory Visit (HOSPITAL_BASED_OUTPATIENT_CLINIC_OR_DEPARTMENT_OTHER): Payer: Medicare HMO

## 2013-08-24 ENCOUNTER — Ambulatory Visit (HOSPITAL_BASED_OUTPATIENT_CLINIC_OR_DEPARTMENT_OTHER): Payer: Medicare HMO

## 2013-08-24 VITALS — BP 135/63 | HR 59 | Temp 98.1°F

## 2013-08-24 DIAGNOSIS — N184 Chronic kidney disease, stage 4 (severe): Secondary | ICD-10-CM

## 2013-08-24 DIAGNOSIS — D649 Anemia, unspecified: Secondary | ICD-10-CM

## 2013-08-24 DIAGNOSIS — D469 Myelodysplastic syndrome, unspecified: Secondary | ICD-10-CM

## 2013-08-24 DIAGNOSIS — D696 Thrombocytopenia, unspecified: Secondary | ICD-10-CM

## 2013-08-24 DIAGNOSIS — N189 Chronic kidney disease, unspecified: Secondary | ICD-10-CM

## 2013-08-24 LAB — CBC WITH DIFFERENTIAL/PLATELET
BASO%: 2.1 % — ABNORMAL HIGH (ref 0.0–2.0)
Basophils Absolute: 0.1 10*3/uL (ref 0.0–0.1)
EOS ABS: 0.3 10*3/uL (ref 0.0–0.5)
EOS%: 5.1 % (ref 0.0–7.0)
HCT: 26.9 % — ABNORMAL LOW (ref 38.4–49.9)
HGB: 8.8 g/dL — ABNORMAL LOW (ref 13.0–17.1)
LYMPH#: 1.3 10*3/uL (ref 0.9–3.3)
LYMPH%: 24.8 % (ref 14.0–49.0)
MCH: 33.3 pg (ref 27.2–33.4)
MCHC: 32.7 g/dL (ref 32.0–36.0)
MCV: 102.1 fL — ABNORMAL HIGH (ref 79.3–98.0)
MONO#: 0.6 10*3/uL (ref 0.1–0.9)
MONO%: 11.9 % (ref 0.0–14.0)
NEUT#: 3 10*3/uL (ref 1.5–6.5)
NEUT%: 56.1 % (ref 39.0–75.0)
Platelets: 143 10*3/uL (ref 140–400)
RBC: 2.64 10*6/uL — AB (ref 4.20–5.82)
RDW: 18.2 % — AB (ref 11.0–14.6)
WBC: 5.3 10*3/uL (ref 4.0–10.3)

## 2013-08-24 LAB — HOLD TUBE, BLOOD BANK

## 2013-08-24 MED ORDER — EPOETIN ALFA 20000 UNIT/ML IJ SOLN
20000.0000 [IU] | INTRAMUSCULAR | Status: DC
  Administered 2013-08-24: 20000 [IU] via SUBCUTANEOUS
  Filled 2013-08-24: qty 1

## 2013-08-26 ENCOUNTER — Encounter: Payer: Self-pay | Admitting: Internal Medicine

## 2013-08-26 DIAGNOSIS — I495 Sick sinus syndrome: Secondary | ICD-10-CM | POA: Insufficient documentation

## 2013-08-26 DIAGNOSIS — Z950 Presence of cardiac pacemaker: Secondary | ICD-10-CM | POA: Insufficient documentation

## 2013-08-26 NOTE — Progress Notes (Signed)
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 is a 78 y.o. male with a h/o sinus bradycardia sp PPM (SJM) by Dr Leonia Reeves who presents today to establish care in the Electrophysiology device clinic.  The patient has multiple chronic medical issues.  He is on ASA and plavix.  He is not on anticoagulation for afib due to prior GI bleeding. Today, he  denies symptoms of palpitations, chest pain, shortness of breath, orthopnea, PND, dizziness, presyncope, syncope, or neurologic sequela.  His edema is stable.  The patientis tolerating medications without difficulties and is otherwise without complaint today.   Past Medical History  Diagnosis Date  . Hypertension   . Hyperlipidemia   . Ulcer   . GERD (gastroesophageal reflux disease)   . Hemorrhoids   . Intestinal ischemia   . DVT (deep venous thrombosis) 2011    Right arm  . Atrial fibrillation   . Thrombocytopenia, unspecified 04/24/2013  . Gout   . Low back pain   . Depressive disorder   . Internal hemorrhoids without mention of complication   . Gastroparesis   . Chronic vascular insufficiency of intestine   . Osteopenia   . Ischemic colitis   . Chronic blood loss anemia   . Peripheral artery disease   . Diabetes mellitus type 2 with peripheral artery disease   . Chronic diastolic heart failure   . Liddle's syndrome   . Benign neoplasm of kidney, except pelvis   . Diabetes mellitus with kidney disease   . Renal artery stenosis   . CKD (chronic kidney disease)   . Vitamin B 12 deficiency   . CAD (coronary artery disease)     s/b CABG 1994, and subsequent stents. Repeat CABG 12/2011,  . Prostate cancer 1997    XRT and lupron  . Myelodysplastic syndrome 05/22/2013    With low hemoglobin and platelets treated with Procrit  . Angiomyolipoma 2009    On both kidneys noted in 2009  . Sick sinus syndrome    Past Surgical History  Procedure Laterality Date  . Hiatal hernia repair      and ulcer  repair  . Appendectomy  1991  . Pacemaker insertion  10/18/1994    DDD pacemaker, [employer]. Gen change 12/10/2003.  Marland Kitchen Coronary artery bypass graft  01/22/1993  . Cholecystectomy  Oct 2009    Laparoscopic  . Coronary artery bypass graft  01/03/2012    Procedure: REDO CORONARY ARTERY BYPASS GRAFTING (CABG);  Surgeon: Gaye Pollack, [name];  Location: [facility];  Service: Open Heart Surgery;  Laterality: N/A;  Redo CABG x  using bilateral internal mammary arteries;  left leg greater saphenous vein harvested endoscopically  . Celiac artery angioplasty  05-16-12    and stenting  . Partial gastrectomy  1969    Hx of ulcer s/p partial gastrectomy/ has pernicious anemia  . Other surgical history  02/13/13    superior mesenteric artery angiogram  . Thrombectomy / embolectomy subclavian artery  02/02/10    Right subclavian thromboectomy and venous angioplasty, and chronic mesenteric ischemia with Herculink stenting to superior mesenteric and celiac arteries - Dr. Trula Slade  . Other surgical history  05/16/12    Stent in stomach  . Pacemaker generator change  12/10/2003    SJM Identity XL DR performed by Dr Leonia Reeves    History   Social History  . Marital Status: Married    Spouse Name: N/A    Number of Children: N/A  .  Years of Education: N/A   Occupational History  . Not on file.   Social History Main Topics  . Smoking status: Former Smoker -- 20 years    Types: Cigarettes    Quit date: 07/12/1969  . Smokeless tobacco: Never Used  . Alcohol Use: No  . Drug Use: No  . Sexual Activity: Yes   Other Topics Concern  . Not on file   Social History Narrative  . No narrative on file    Family History  Problem Relation Age of Onset  . Diabetes Mother   . Heart disease Mother     Heart Disease before age 47  . Hyperlipidemia Mother   . Hypertension Mother   . CVA Mother 42    cause of death  . Cancer Father     stomach/liver  . Hypertension Father     possibly hypertensive  . Cancer Sister      Breast cancer  . Heart disease Daughter     Heart Disease before age 44  . Hypertension Daughter   . Heart attack Daughter   . CAD Brother   . Heart disease Brother   . Cancer Paternal Uncle     colon  . Heart disease Sister   . Diabetes Sister   . Hypertension Sister     Allergies  Allergen Reactions  . Ace Inhibitors Cough  . Ibuprofen Other (See Comments)    GI Issues  . Nsaids Other (See Comments)    CKD    Current Outpatient Prescriptions  Medication Sig Dispense Refill  . acetaminophen (ARTHRITIS PAIN RELIEF) 650 MG CR tablet Take 1,300 mg by mouth 2 (two) times daily.       Marland Kitchen amiodarone (PACERONE) 200 MG tablet Take 100 mg by mouth daily.      Marland Kitchen amLODipine (NORVASC) 10 MG tablet Take 10 mg by mouth daily.      Marland Kitchen aspirin 325 MG tablet Take 325 mg by mouth daily.      Marland Kitchen atorvastatin (LIPITOR) 80 MG tablet Take 80 mg by mouth daily.      Marland Kitchen CALCIUM PO Take 2,000 mg by mouth daily.       . carvedilol (COREG) 25 MG tablet Take 1 tablet (25 mg total) by mouth 2 (two) times daily with a meal.  60 tablet  11  . cholecalciferol (VITAMIN D) 1000 UNITS tablet Take 1,000 Units by mouth daily.       . cloNIDine (CATAPRES) 0.2 MG tablet Take 0.2 mg by mouth 2 (two) times daily.       . clopidogrel (PLAVIX) 75 MG tablet TAKE ONE TABLET BY MOUTH ONCE DAILY  30 tablet  11  . Cyanocobalamin (VITAMIN B-12 IJ) Inject as directed.      . furosemide (LASIX) 80 MG tablet Take 1.5 tablets (120 mg total) by mouth 2 (two) times daily.  90 tablet  1  . hydrALAZINE (APRESOLINE) 50 MG tablet Take 1 tablet (50 mg total) by mouth 3 (three) times daily.  90 tablet  11  . isosorbide mononitrate (IMDUR) 120 MG 24 hr tablet Take 1 tablet (120 mg total) by mouth daily.  30 tablet  11  . pantoprazole (PROTONIX) 40 MG tablet Take 40 mg by mouth daily.      . potassium chloride SA (K-DUR,KLOR-CON) 20 MEQ tablet Take 20 mEq by mouth 2 (two) times daily.      . silodosin (RAPAFLO) 8 MG CAPS capsule Take 8 mg  by mouth daily.  No current facility-administered medications for this visit.    ROS- all systems are reviewed and negative except as per HPI  Physical Exam: Filed Vitals:   08/22/13 1146  BP: 132/62  Pulse: 60  Height: 5\' 10"  (1.778 m)  Weight: 174 lb (78.926 kg)    GEN- The patient is chronically ill appearing, alert and oriented x 3 today.   Head- normocephalic, atraumatic Eyes-  Sclera clear, conjunctiva pink Ears- hearing intact Oropharynx- clear Neck- supple  Lungs- Clear to ausculation bilaterally, normal work of breathing Chest- pacemaker pocket is well healed Heart- Regular rate and rhythm  GI- soft, NT, ND, + BS Extremities- no clubbing, cyanosis, + edema MS- age appropriate atrophy Skin- no rash or lesion Psych- euthymic mood, full affect Neuro- strength and sensation are intact  Pacemaker interrogation- reviewed in detail today,  See PACEART report  Assessment and Plan:  1. Sick sinus syndrome Normal pacemaker function See Pace Art report No changes today  2. Paroxysmal atrial fibrillation chads2vasc score is at least 4.  He is not on anticoagulation due to bleeding concerns. I would consider stopping ASA and plavix and switching to eliquis.  I will defer this decision to Dr Tamala Julian who knows the patient well.  3. CAD No ischemic symptoms Stable No change required today  4. HTN Stable No change required today  Return to the device clinic in 6 months Return to see Jerene Pitch in 1 year Follow-up with Dr Tamala Julian as scheduled

## 2013-08-30 ENCOUNTER — Other Ambulatory Visit: Payer: Self-pay | Admitting: Hematology and Oncology

## 2013-08-30 DIAGNOSIS — D469 Myelodysplastic syndrome, unspecified: Secondary | ICD-10-CM

## 2013-08-31 ENCOUNTER — Telehealth: Payer: Self-pay | Admitting: Hematology and Oncology

## 2013-08-31 ENCOUNTER — Ambulatory Visit (HOSPITAL_BASED_OUTPATIENT_CLINIC_OR_DEPARTMENT_OTHER): Payer: Medicare HMO | Admitting: Hematology and Oncology

## 2013-08-31 ENCOUNTER — Ambulatory Visit: Payer: Medicare HMO

## 2013-08-31 ENCOUNTER — Other Ambulatory Visit (HOSPITAL_BASED_OUTPATIENT_CLINIC_OR_DEPARTMENT_OTHER): Payer: Medicare HMO

## 2013-08-31 ENCOUNTER — Encounter: Payer: Self-pay | Admitting: Internal Medicine

## 2013-08-31 ENCOUNTER — Encounter: Payer: Self-pay | Admitting: Hematology and Oncology

## 2013-08-31 VITALS — BP 153/71 | HR 60 | Temp 96.9°F | Resp 19 | Ht 70.0 in | Wt 180.3 lb

## 2013-08-31 DIAGNOSIS — I509 Heart failure, unspecified: Secondary | ICD-10-CM

## 2013-08-31 DIAGNOSIS — D469 Myelodysplastic syndrome, unspecified: Secondary | ICD-10-CM

## 2013-08-31 DIAGNOSIS — N189 Chronic kidney disease, unspecified: Secondary | ICD-10-CM

## 2013-08-31 DIAGNOSIS — I10 Essential (primary) hypertension: Secondary | ICD-10-CM

## 2013-08-31 DIAGNOSIS — D649 Anemia, unspecified: Secondary | ICD-10-CM

## 2013-08-31 DIAGNOSIS — D696 Thrombocytopenia, unspecified: Secondary | ICD-10-CM

## 2013-08-31 DIAGNOSIS — Z8546 Personal history of malignant neoplasm of prostate: Secondary | ICD-10-CM

## 2013-08-31 DIAGNOSIS — E538 Deficiency of other specified B group vitamins: Secondary | ICD-10-CM

## 2013-08-31 LAB — CBC WITH DIFFERENTIAL/PLATELET
BASO%: 2.4 % — AB (ref 0.0–2.0)
Basophils Absolute: 0.1 10*3/uL (ref 0.0–0.1)
EOS%: 4.8 % (ref 0.0–7.0)
Eosinophils Absolute: 0.3 10*3/uL (ref 0.0–0.5)
HEMATOCRIT: 29.8 % — AB (ref 38.4–49.9)
HGB: 9.6 g/dL — ABNORMAL LOW (ref 13.0–17.1)
LYMPH%: 18.9 % (ref 14.0–49.0)
MCH: 33.6 pg — ABNORMAL HIGH (ref 27.2–33.4)
MCHC: 32.2 g/dL (ref 32.0–36.0)
MCV: 104.4 fL — ABNORMAL HIGH (ref 79.3–98.0)
MONO#: 0.7 10*3/uL (ref 0.1–0.9)
MONO%: 12.6 % (ref 0.0–14.0)
NEUT#: 3.6 10*3/uL (ref 1.5–6.5)
NEUT%: 61.3 % (ref 39.0–75.0)
PLATELETS: 119 10*3/uL — AB (ref 140–400)
RBC: 2.85 10*6/uL — ABNORMAL LOW (ref 4.20–5.82)
RDW: 20 % — ABNORMAL HIGH (ref 11.0–14.6)
WBC: 5.8 10*3/uL (ref 4.0–10.3)
lymph#: 1.1 10*3/uL (ref 0.9–3.3)

## 2013-08-31 LAB — HOLD TUBE, BLOOD BANK

## 2013-08-31 MED ORDER — EPOETIN ALFA 20000 UNIT/ML IJ SOLN
20000.0000 [IU] | INTRAMUSCULAR | Status: DC
  Administered 2013-08-31: 20000 [IU] via SUBCUTANEOUS
  Filled 2013-08-31: qty 1

## 2013-08-31 NOTE — Telephone Encounter (Signed)
Gave pt appt for lab, injection and MD for March , April May 2015

## 2013-08-31 NOTE — Progress Notes (Signed)
[facility] OFFICE PROGRESS NOTE  Patient Care Team: Hulan Fess, MD as PCP - General (Family Medicine) Sinclair Grooms, MD (Cardiology) Winfield Cunas., MD (Gastroenterology) Lowella Bandy, MD (Urology) Heath Lark, MD as Consulting Physician (Hematology and Oncology)  DIAGNOSIS: Myelodysplastic syndrome, history of prostate cancer, ongoing treatment with erythropoietin stimulating agents.  SUMMARY OF ONCOLOGIC HISTORY: This is a very interesting gentleman with a history of prostate cancer diagnosed in 1997, status post radiation and seed implantation and Lupron injection. The patient cannot recall the stage of his disease. He's been getting Lupron injection every 4 months. He does not recall his last PSA number but was never told he require urgent attention of further treatment apart from just getting Lupron injection. He was also found to have chronic anemia in the past and has been placed on oral ion supplements 4 years of which he took twice a day. His last ferritin level was over 1300. The patient also has extensive EGD and colonoscopy done which showed no evidence of active bleeding. He had B12 level checked recently and was found to have B12 deficiency and was started on B12 injections. The patient also has significant anemia requiring hospitalization and recent blood transfusion. On 05/10/2013 he had bone marrow aspirate and biopsy which is consistent with myelodysplastic syndrome. We started him on erythropoietin stimulating agents for anemia.  INTERVAL HISTORY: [name] AIKMAN Sr. 78 y.o. male returns for  he has slightly improved energy level recently. He still had persistent high blood pressure but it is improved compared to before. He denies any recent exacerbation of congestive heart failure. Denies any leg swelling.  I have reviewed the past medical history, past surgical history, social history and family history with the patient and they are unchanged from  previous note.  ALLERGIES:  is allergic to ace inhibitors; ibuprofen; and nsaids.  MEDICATIONS:  Current Outpatient Prescriptions  Medication Sig Dispense Refill  . acetaminophen (ARTHRITIS PAIN RELIEF) 650 MG CR tablet Take 1,300 mg by mouth 2 (two) times daily.       Marland Kitchen amiodarone (PACERONE) 200 MG tablet Take 100 mg by mouth daily.      Marland Kitchen amLODipine (NORVASC) 10 MG tablet Take 10 mg by mouth daily.      Marland Kitchen aspirin 325 MG tablet Take 325 mg by mouth daily.      Marland Kitchen atorvastatin (LIPITOR) 80 MG tablet Take 80 mg by mouth daily.      Marland Kitchen CALCIUM PO Take 2,000 mg by mouth daily.       . carvedilol (COREG) 25 MG tablet Take 1 tablet (25 mg total) by mouth 2 (two) times daily with a meal.  60 tablet  11  . cholecalciferol (VITAMIN D) 1000 UNITS tablet Take 1,000 Units by mouth daily.       . cloNIDine (CATAPRES) 0.2 MG tablet Take 0.2 mg by mouth 2 (two) times daily.       . clopidogrel (PLAVIX) 75 MG tablet TAKE ONE TABLET BY MOUTH ONCE DAILY  30 tablet  11  . Cyanocobalamin (VITAMIN B-12 IJ) Inject as directed.      . furosemide (LASIX) 80 MG tablet Take 1.5 tablets (120 mg total) by mouth 2 (two) times daily.  90 tablet  1  . hydrALAZINE (APRESOLINE) 50 MG tablet Take 1 tablet (50 mg total) by mouth 3 (three) times daily.  90 tablet  11  . isosorbide mononitrate (IMDUR) 120 MG 24 hr tablet Take 1 tablet (120 mg  total) by mouth daily.  30 tablet  11  . pantoprazole (PROTONIX) 40 MG tablet Take 40 mg by mouth daily.      . potassium chloride SA (K-DUR,KLOR-CON) 20 MEQ tablet Take 20 mEq by mouth 2 (two) times daily.      . silodosin (RAPAFLO) 8 MG CAPS capsule Take 8 mg by mouth daily.        Current Facility-Administered Medications  Medication Dose Route Frequency Provider Last Rate Last Dose  . epoetin alfa (EPOGEN,PROCRIT) injection 20,000 Units  20,000 Units Subcutaneous Weekly Heath Lark, MD   20,000 Units at 08/31/13 1219    REVIEW OF SYSTEMS:   Constitutional: Denies fevers, chills or  abnormal weight loss Behavioral/Psych: Mood is stable, no new changes  All other systems were reviewed with the patient and are negative.  PHYSICAL EXAMINATION: ECOG PERFORMANCE STATUS: 1 - Symptomatic but completely ambulatory  Filed Vitals:   08/31/13 1207  BP: 153/71  Pulse: 60  Temp: 96.9 F (36.1 C)  Resp: 19   Filed Weights   08/31/13 1207  Weight: 180 lb 4.8 oz (81.784 kg)    GENERAL:alert, no distress and comfortable SKIN: skin color, texture, turgor are normal, no rashes or significant lesions EYES: normal, Conjunctiva are pink and non-injected, sclera clear OROPHARYNX:no exudate, no erythema and lips, buccal mucosa, and tongue normal  NECK: supple, thyroid normal size, non-tender, without nodularity LYMPH:  no palpable lymphadenopathy in the cervical, axillary or inguinal LUNGS: clear to auscultation and percussion with normal breathing effort HEART: regular rate & rhythm and no murmurs and no lower extremity edema ABDOMEN:abdomen soft, non-tender and normal bowel sounds Musculoskeletal:no cyanosis of digits and no clubbing  NEURO: alert & oriented x 3 with fluent speech, no focal motor/sensory deficits  LABORATORY DATA:  I have reviewed the data as listed    Component Value Date/Time   NA 146* 08/10/2013 1324   NA 140 06/28/2013 1225   K 3.9 08/10/2013 1324   K 3.9 06/28/2013 1225   CL 106 06/28/2013 1225   CO2 27 08/10/2013 1324   CO2 24 06/28/2013 1225   GLUCOSE 105 08/10/2013 1324   GLUCOSE 134* 06/28/2013 1225   BUN 40.3* 08/10/2013 1324   BUN 31* 06/28/2013 1225   CREATININE 2.6* 08/10/2013 1324   CREATININE 2.9* 06/28/2013 1225   CALCIUM 9.4 08/10/2013 1324   CALCIUM 8.9 06/28/2013 1225   PROT 7.0 08/10/2013 1324   PROT 6.0 06/06/2013 0225   ALBUMIN 3.8 08/10/2013 1324   ALBUMIN 2.3* 06/12/2013 0542   AST 14 08/10/2013 1324   AST 21 06/06/2013 0225   ALT 8 08/10/2013 1324   ALT 16 06/06/2013 0225   ALKPHOS 47 08/10/2013 1324   ALKPHOS 63 06/06/2013 0225    BILITOT 0.33 08/10/2013 1324   BILITOT 0.3 06/06/2013 0225   GFRNONAA 19* 06/14/2013 0535   GFRAA 22* 06/14/2013 0535    No results found for this basename: SPEP,  UPEP,   kappa and lambda light chains    Lab Results  Component Value Date   WBC 5.8 08/31/2013   NEUTROABS 3.6 08/31/2013   HGB 9.6* 08/31/2013   HCT 29.8* 08/31/2013   MCV 104.4* 08/31/2013   PLT 119* 08/31/2013      Chemistry      Component Value Date/Time   NA 146* 08/10/2013 1324   NA 140 06/28/2013 1225   K 3.9 08/10/2013 1324   K 3.9 06/28/2013 1225   CL 106 06/28/2013 1225   CO2 27  08/10/2013 1324   CO2 24 06/28/2013 1225   BUN 40.3* 08/10/2013 1324   BUN 31* 06/28/2013 1225   CREATININE 2.6* 08/10/2013 1324   CREATININE 2.9* 06/28/2013 1225      Component Value Date/Time   CALCIUM 9.4 08/10/2013 1324   CALCIUM 8.9 06/28/2013 1225   ALKPHOS 47 08/10/2013 1324   ALKPHOS 63 06/06/2013 0225   AST 14 08/10/2013 1324   AST 21 06/06/2013 0225   ALT 8 08/10/2013 1324   ALT 16 06/06/2013 0225   BILITOT 0.33 08/10/2013 1324   BILITOT 0.3 06/06/2013 0225     ASSESSMENT & PLAN:  #1 severe anemia #2 myelodysplastic syndrome #3 congestive heart failure This is likely anemia of chronic disease, as well as related to his myelodysplastic syndrome. The patient denies recent history of bleeding such as epistaxis, hematuria or hematochezia. He is asymptomatic from the anemia. We will observe for now.  He does not require transfusion now.  I will continue his Procrit injection to 20,000 units weekly with goal to keep his hemoglobin greater than 10 g. I am scheduling him to come back for blood work every week If his blood count dropped to less than 7 all the patient has more symptoms of anemia, we will consider blood transfusions. #4 chronic kidney disease This is one of the major contributing factor as well to his anemia. His kidney function is currently stable #5 thrombocytopenia This is also related to his myelodysplastic  syndrome. We will observe for now #6 hypertension His blood pressure is better today. We will monitor his blood pressure carefully while he is on erythropoietin stimulating agents. #7 history of prostate cancer His recent PSA is at 3. He will continue Lupron injection under the direction of his urologist. All questions were answered. The patient knows to call the clinic with any problems, questions or concerns. No barriers to learning was detected. I spent 15 minutes counseling the patient face to face. The total time spent in the appointment was 20 minutes and more than 50% was on counseling and review of test results     [facility], [place], MD 08/31/2013 1:17 PM

## 2013-09-03 ENCOUNTER — Telehealth: Payer: Self-pay | Admitting: Interventional Cardiology

## 2013-09-03 NOTE — Telephone Encounter (Signed)
New Message  [name]//[facility] called. She states that there is an outstanding order for Home health certification and plan of care.. Faxed on 07/16/2013.. Will refax if needed. Please call to confirm receipt of previous orders to prevent duplication

## 2013-09-07 ENCOUNTER — Ambulatory Visit: Payer: Commercial Managed Care - HMO

## 2013-09-07 ENCOUNTER — Other Ambulatory Visit (HOSPITAL_BASED_OUTPATIENT_CLINIC_OR_DEPARTMENT_OTHER): Payer: Medicare HMO

## 2013-09-07 DIAGNOSIS — D469 Myelodysplastic syndrome, unspecified: Secondary | ICD-10-CM

## 2013-09-07 DIAGNOSIS — E538 Deficiency of other specified B group vitamins: Secondary | ICD-10-CM

## 2013-09-07 DIAGNOSIS — D649 Anemia, unspecified: Secondary | ICD-10-CM

## 2013-09-07 LAB — CBC WITH DIFFERENTIAL/PLATELET
BASO%: 0.2 % (ref 0.0–2.0)
BASOS ABS: 0 10*3/uL (ref 0.0–0.1)
EOS%: 4.8 % (ref 0.0–7.0)
Eosinophils Absolute: 0.3 10*3/uL (ref 0.0–0.5)
HCT: 33.9 % — ABNORMAL LOW (ref 38.4–49.9)
HEMOGLOBIN: 10.8 g/dL — AB (ref 13.0–17.1)
LYMPH%: 21.5 % (ref 14.0–49.0)
MCH: 33.6 pg — AB (ref 27.2–33.4)
MCHC: 32 g/dL (ref 32.0–36.0)
MCV: 105.3 fL — AB (ref 79.3–98.0)
MONO#: 0.8 10*3/uL (ref 0.1–0.9)
MONO%: 14 % (ref 0.0–14.0)
NEUT#: 3.3 10*3/uL (ref 1.5–6.5)
NEUT%: 59.5 % (ref 39.0–75.0)
PLATELETS: 114 10*3/uL — AB (ref 140–400)
RBC: 3.22 10*6/uL — ABNORMAL LOW (ref 4.20–5.82)
RDW: 19.5 % — ABNORMAL HIGH (ref 11.0–14.6)
WBC: 5.5 10*3/uL (ref 4.0–10.3)
lymph#: 1.2 10*3/uL (ref 0.9–3.3)

## 2013-09-07 LAB — VITAMIN B12: Vitamin B-12: 1010 pg/mL — ABNORMAL HIGH (ref 211–911)

## 2013-09-07 LAB — TECHNOLOGIST REVIEW

## 2013-09-07 MED ORDER — EPOETIN ALFA 20000 UNIT/ML IJ SOLN: 20000.0000 [IU] | INTRAMUSCULAR | Status: DC

## 2013-09-14 ENCOUNTER — Ambulatory Visit: Payer: Commercial Managed Care - HMO

## 2013-09-14 ENCOUNTER — Other Ambulatory Visit (HOSPITAL_BASED_OUTPATIENT_CLINIC_OR_DEPARTMENT_OTHER): Payer: Medicare HMO

## 2013-09-14 DIAGNOSIS — D469 Myelodysplastic syndrome, unspecified: Secondary | ICD-10-CM

## 2013-09-14 DIAGNOSIS — D649 Anemia, unspecified: Secondary | ICD-10-CM

## 2013-09-14 LAB — CBC WITH DIFFERENTIAL/PLATELET
BASO%: 3.4 % — AB (ref 0.0–2.0)
BASOS ABS: 0.1 10*3/uL (ref 0.0–0.1)
EOS%: 6.1 % (ref 0.0–7.0)
Eosinophils Absolute: 0.2 10*3/uL (ref 0.0–0.5)
HCT: 33.6 % — ABNORMAL LOW (ref 38.4–49.9)
HGB: 10.9 g/dL — ABNORMAL LOW (ref 13.0–17.1)
LYMPH%: 28.5 % (ref 14.0–49.0)
MCH: 33.7 pg — ABNORMAL HIGH (ref 27.2–33.4)
MCHC: 32.3 g/dL (ref 32.0–36.0)
MCV: 104.2 fL — AB (ref 79.3–98.0)
MONO#: 0.4 10*3/uL (ref 0.1–0.9)
MONO%: 12.9 % (ref 0.0–14.0)
NEUT#: 1.7 10*3/uL (ref 1.5–6.5)
NEUT%: 49.1 % (ref 39.0–75.0)
Platelets: 95 10*3/uL — ABNORMAL LOW (ref 140–400)
RBC: 3.22 10*6/uL — ABNORMAL LOW (ref 4.20–5.82)
RDW: 17.2 % — ABNORMAL HIGH (ref 11.0–14.6)
WBC: 3.4 10*3/uL — ABNORMAL LOW (ref 4.0–10.3)
lymph#: 1 10*3/uL (ref 0.9–3.3)

## 2013-09-14 MED ORDER — EPOETIN ALFA 20000 UNIT/ML IJ SOLN
20000.0000 [IU] | INTRAMUSCULAR | Status: DC
  Filled 2013-09-14: qty 1

## 2013-09-21 ENCOUNTER — Other Ambulatory Visit (HOSPITAL_BASED_OUTPATIENT_CLINIC_OR_DEPARTMENT_OTHER): Payer: Medicare HMO

## 2013-09-21 ENCOUNTER — Ambulatory Visit: Payer: Commercial Managed Care - HMO

## 2013-09-21 DIAGNOSIS — D469 Myelodysplastic syndrome, unspecified: Secondary | ICD-10-CM

## 2013-09-21 LAB — CBC WITH DIFFERENTIAL/PLATELET
BASO%: 1.3 % (ref 0.0–2.0)
Basophils Absolute: 0.1 10*3/uL (ref 0.0–0.1)
EOS ABS: 0.2 10*3/uL (ref 0.0–0.5)
EOS%: 4.2 % (ref 0.0–7.0)
HEMATOCRIT: 34.1 % — AB (ref 38.4–49.9)
HEMOGLOBIN: 11.1 g/dL — AB (ref 13.0–17.1)
LYMPH%: 18.2 % (ref 14.0–49.0)
MCH: 33.4 pg (ref 27.2–33.4)
MCHC: 32.5 g/dL (ref 32.0–36.0)
MCV: 102.7 fL — ABNORMAL HIGH (ref 79.3–98.0)
MONO#: 0.5 10*3/uL (ref 0.1–0.9)
MONO%: 10.5 % (ref 0.0–14.0)
NEUT%: 65.8 % (ref 39.0–75.0)
NEUTROS ABS: 3.2 10*3/uL (ref 1.5–6.5)
PLATELETS: 92 10*3/uL — AB (ref 140–400)
RBC: 3.32 10*6/uL — ABNORMAL LOW (ref 4.20–5.82)
RDW: 16.2 % — ABNORMAL HIGH (ref 11.0–14.6)
WBC: 4.9 10*3/uL (ref 4.0–10.3)
lymph#: 0.9 10*3/uL (ref 0.9–3.3)

## 2013-09-21 MED ORDER — EPOETIN ALFA 20000 UNIT/ML IJ SOLN: 20000.0000 [IU] | INTRAMUSCULAR | Status: DC

## 2013-09-28 ENCOUNTER — Other Ambulatory Visit (HOSPITAL_BASED_OUTPATIENT_CLINIC_OR_DEPARTMENT_OTHER): Payer: Medicare HMO

## 2013-09-28 ENCOUNTER — Ambulatory Visit: Payer: Commercial Managed Care - HMO

## 2013-09-28 DIAGNOSIS — D469 Myelodysplastic syndrome, unspecified: Secondary | ICD-10-CM

## 2013-09-28 LAB — CBC WITH DIFFERENTIAL/PLATELET
BASO%: 1.3 % (ref 0.0–2.0)
BASOS ABS: 0.1 10*3/uL (ref 0.0–0.1)
EOS%: 6.7 % (ref 0.0–7.0)
Eosinophils Absolute: 0.3 10*3/uL (ref 0.0–0.5)
HEMATOCRIT: 31.1 % — AB (ref 38.4–49.9)
HEMOGLOBIN: 10.2 g/dL — AB (ref 13.0–17.1)
LYMPH%: 26.5 % (ref 14.0–49.0)
MCH: 33.6 pg — AB (ref 27.2–33.4)
MCHC: 32.8 g/dL (ref 32.0–36.0)
MCV: 102.4 fL — ABNORMAL HIGH (ref 79.3–98.0)
MONO#: 0.6 10*3/uL (ref 0.1–0.9)
MONO%: 13.4 % (ref 0.0–14.0)
NEUT#: 2.2 10*3/uL (ref 1.5–6.5)
NEUT%: 52.1 % (ref 39.0–75.0)
PLATELETS: 94 10*3/uL — AB (ref 140–400)
RBC: 3.03 10*6/uL — ABNORMAL LOW (ref 4.20–5.82)
RDW: 15.8 % — ABNORMAL HIGH (ref 11.0–14.6)
WBC: 4.1 10*3/uL (ref 4.0–10.3)
lymph#: 1.1 10*3/uL (ref 0.9–3.3)

## 2013-09-28 MED ORDER — EPOETIN ALFA 20000 UNIT/ML IJ SOLN: 20000.0000 [IU] | INTRAMUSCULAR | Status: DC

## 2013-10-05 ENCOUNTER — Ambulatory Visit: Payer: Commercial Managed Care - HMO

## 2013-10-05 ENCOUNTER — Other Ambulatory Visit (HOSPITAL_BASED_OUTPATIENT_CLINIC_OR_DEPARTMENT_OTHER): Payer: Commercial Managed Care - HMO

## 2013-10-05 DIAGNOSIS — D469 Myelodysplastic syndrome, unspecified: Secondary | ICD-10-CM

## 2013-10-05 LAB — CBC WITH DIFFERENTIAL/PLATELET
BASO%: 1.4 % (ref 0.0–2.0)
Basophils Absolute: 0.1 10*3/uL (ref 0.0–0.1)
EOS%: 7 % (ref 0.0–7.0)
Eosinophils Absolute: 0.3 10*3/uL (ref 0.0–0.5)
HEMATOCRIT: 33.2 % — AB (ref 38.4–49.9)
HGB: 10.9 g/dL — ABNORMAL LOW (ref 13.0–17.1)
LYMPH#: 1.1 10*3/uL (ref 0.9–3.3)
LYMPH%: 25.3 % (ref 14.0–49.0)
MCH: 33 pg (ref 27.2–33.4)
MCHC: 32.7 g/dL (ref 32.0–36.0)
MCV: 100.9 fL — AB (ref 79.3–98.0)
MONO#: 0.6 10*3/uL (ref 0.1–0.9)
MONO%: 13.6 % (ref 0.0–14.0)
NEUT#: 2.2 10*3/uL (ref 1.5–6.5)
NEUT%: 52.7 % (ref 39.0–75.0)
Platelets: 97 10*3/uL — ABNORMAL LOW (ref 140–400)
RBC: 3.29 10*6/uL — ABNORMAL LOW (ref 4.20–5.82)
RDW: 15.8 % — ABNORMAL HIGH (ref 11.0–14.6)
WBC: 4.3 10*3/uL (ref 4.0–10.3)

## 2013-10-05 MED ORDER — EPOETIN ALFA 20000 UNIT/ML IJ SOLN: 20000.0000 [IU] | INTRAMUSCULAR | Status: DC

## 2013-10-12 ENCOUNTER — Other Ambulatory Visit (HOSPITAL_BASED_OUTPATIENT_CLINIC_OR_DEPARTMENT_OTHER): Payer: Commercial Managed Care - HMO

## 2013-10-12 ENCOUNTER — Ambulatory Visit (HOSPITAL_BASED_OUTPATIENT_CLINIC_OR_DEPARTMENT_OTHER): Payer: Commercial Managed Care - HMO

## 2013-10-12 VITALS — BP 133/58 | HR 60 | Temp 98.6°F

## 2013-10-12 DIAGNOSIS — D469 Myelodysplastic syndrome, unspecified: Secondary | ICD-10-CM

## 2013-10-12 DIAGNOSIS — N189 Chronic kidney disease, unspecified: Secondary | ICD-10-CM

## 2013-10-12 DIAGNOSIS — D649 Anemia, unspecified: Secondary | ICD-10-CM

## 2013-10-12 LAB — CBC WITH DIFFERENTIAL/PLATELET
BASO%: 1 % (ref 0.0–2.0)
BASOS ABS: 0.1 10*3/uL (ref 0.0–0.1)
EOS%: 2.6 % (ref 0.0–7.0)
Eosinophils Absolute: 0.1 10*3/uL (ref 0.0–0.5)
HCT: 28.8 % — ABNORMAL LOW (ref 38.4–49.9)
HEMOGLOBIN: 9.5 g/dL — AB (ref 13.0–17.1)
LYMPH#: 0.7 10*3/uL — AB (ref 0.9–3.3)
LYMPH%: 13.1 % — ABNORMAL LOW (ref 14.0–49.0)
MCH: 33.5 pg — ABNORMAL HIGH (ref 27.2–33.4)
MCHC: 33 g/dL (ref 32.0–36.0)
MCV: 101.3 fL — AB (ref 79.3–98.0)
MONO#: 1.1 10*3/uL — ABNORMAL HIGH (ref 0.1–0.9)
MONO%: 20.1 % — AB (ref 0.0–14.0)
NEUT#: 3.4 10*3/uL (ref 1.5–6.5)
NEUT%: 63.2 % (ref 39.0–75.0)
Platelets: 84 10*3/uL — ABNORMAL LOW (ref 140–400)
RBC: 2.84 10*6/uL — ABNORMAL LOW (ref 4.20–5.82)
RDW: 15.5 % — AB (ref 11.0–14.6)
WBC: 5.3 10*3/uL (ref 4.0–10.3)

## 2013-10-12 MED ORDER — EPOETIN ALFA 20000 UNIT/ML IJ SOLN
20000.0000 [IU] | INTRAMUSCULAR | Status: DC
  Administered 2013-10-12: 20000 [IU] via SUBCUTANEOUS
  Filled 2013-10-12: qty 1

## 2013-10-19 ENCOUNTER — Other Ambulatory Visit (HOSPITAL_BASED_OUTPATIENT_CLINIC_OR_DEPARTMENT_OTHER): Payer: Medicare HMO

## 2013-10-19 ENCOUNTER — Ambulatory Visit: Payer: Commercial Managed Care - HMO

## 2013-10-19 DIAGNOSIS — D649 Anemia, unspecified: Secondary | ICD-10-CM

## 2013-10-19 DIAGNOSIS — D469 Myelodysplastic syndrome, unspecified: Secondary | ICD-10-CM

## 2013-10-19 LAB — CBC WITH DIFFERENTIAL/PLATELET
BASO%: 0.9 % (ref 0.0–2.0)
Basophils Absolute: 0 10*3/uL (ref 0.0–0.1)
EOS%: 8.5 % — ABNORMAL HIGH (ref 0.0–7.0)
Eosinophils Absolute: 0.4 10*3/uL (ref 0.0–0.5)
HCT: 33.6 % — ABNORMAL LOW (ref 38.4–49.9)
HEMOGLOBIN: 10.9 g/dL — AB (ref 13.0–17.1)
LYMPH%: 22.1 % (ref 14.0–49.0)
MCH: 33.5 pg — ABNORMAL HIGH (ref 27.2–33.4)
MCHC: 32.5 g/dL (ref 32.0–36.0)
MCV: 103.1 fL — AB (ref 79.3–98.0)
MONO#: 0.5 10*3/uL (ref 0.1–0.9)
MONO%: 10.5 % (ref 0.0–14.0)
NEUT#: 3 10*3/uL (ref 1.5–6.5)
NEUT%: 58 % (ref 39.0–75.0)
Platelets: 138 10*3/uL — ABNORMAL LOW (ref 140–400)
RBC: 3.26 10*6/uL — AB (ref 4.20–5.82)
RDW: 15.7 % — AB (ref 11.0–14.6)
WBC: 5.2 10*3/uL (ref 4.0–10.3)
lymph#: 1.1 10*3/uL (ref 0.9–3.3)

## 2013-10-19 NOTE — Progress Notes (Signed)
Procrit held, Hgb 10.9, parameters not met

## 2013-10-26 ENCOUNTER — Ambulatory Visit: Payer: Commercial Managed Care - HMO

## 2013-10-26 ENCOUNTER — Other Ambulatory Visit (HOSPITAL_BASED_OUTPATIENT_CLINIC_OR_DEPARTMENT_OTHER): Payer: Medicare HMO

## 2013-10-26 DIAGNOSIS — D649 Anemia, unspecified: Secondary | ICD-10-CM

## 2013-10-26 DIAGNOSIS — D469 Myelodysplastic syndrome, unspecified: Secondary | ICD-10-CM

## 2013-10-26 DIAGNOSIS — N189 Chronic kidney disease, unspecified: Secondary | ICD-10-CM

## 2013-10-26 LAB — CBC WITH DIFFERENTIAL/PLATELET
BASO%: 1.1 % (ref 0.0–2.0)
BASOS ABS: 0.1 10*3/uL (ref 0.0–0.1)
EOS%: 6.9 % (ref 0.0–7.0)
Eosinophils Absolute: 0.3 10*3/uL (ref 0.0–0.5)
HEMATOCRIT: 31.8 % — AB (ref 38.4–49.9)
HGB: 10.4 g/dL — ABNORMAL LOW (ref 13.0–17.1)
LYMPH%: 23.5 % (ref 14.0–49.0)
MCH: 33.2 pg (ref 27.2–33.4)
MCHC: 32.6 g/dL (ref 32.0–36.0)
MCV: 101.9 fL — ABNORMAL HIGH (ref 79.3–98.0)
MONO#: 0.6 10*3/uL (ref 0.1–0.9)
MONO%: 12.2 % (ref 0.0–14.0)
NEUT%: 56.3 % (ref 39.0–75.0)
NEUTROS ABS: 2.8 10*3/uL (ref 1.5–6.5)
PLATELETS: 111 10*3/uL — AB (ref 140–400)
RBC: 3.12 10*6/uL — ABNORMAL LOW (ref 4.20–5.82)
RDW: 15.4 % — ABNORMAL HIGH (ref 11.0–14.6)
WBC: 5 10*3/uL (ref 4.0–10.3)
lymph#: 1.2 10*3/uL (ref 0.9–3.3)

## 2013-10-26 MED ORDER — EPOETIN ALFA 20000 UNIT/ML IJ SOLN
20000.0000 [IU] | INTRAMUSCULAR | Status: DC
  Filled 2013-10-26: qty 1

## 2013-10-29 ENCOUNTER — Other Ambulatory Visit: Payer: Self-pay

## 2013-10-29 ENCOUNTER — Encounter: Payer: Self-pay | Admitting: Interventional Cardiology

## 2013-10-29 MED ORDER — FUROSEMIDE 80 MG PO TABS: 120.0000 mg | ORAL_TABLET | Freq: Two times a day (BID) | ORAL | Status: DC

## 2013-11-01 ENCOUNTER — Other Ambulatory Visit: Payer: Self-pay | Admitting: Hematology and Oncology

## 2013-11-01 DIAGNOSIS — D469 Myelodysplastic syndrome, unspecified: Secondary | ICD-10-CM

## 2013-11-02 ENCOUNTER — Ambulatory Visit: Payer: Commercial Managed Care - HMO

## 2013-11-02 ENCOUNTER — Other Ambulatory Visit (HOSPITAL_BASED_OUTPATIENT_CLINIC_OR_DEPARTMENT_OTHER): Payer: Medicare HMO

## 2013-11-02 DIAGNOSIS — D469 Myelodysplastic syndrome, unspecified: Secondary | ICD-10-CM

## 2013-11-02 LAB — CBC & DIFF AND RETIC
BASO%: 1.1 % (ref 0.0–2.0)
BASOS ABS: 0 10*3/uL (ref 0.0–0.1)
EOS ABS: 0.3 10*3/uL (ref 0.0–0.5)
EOS%: 6.6 % (ref 0.0–7.0)
HCT: 33.5 % — ABNORMAL LOW (ref 38.4–49.9)
HEMOGLOBIN: 11 g/dL — AB (ref 13.0–17.1)
IMMATURE RETIC FRACT: 2.6 % — AB (ref 3.00–10.60)
LYMPH#: 1 10*3/uL (ref 0.9–3.3)
LYMPH%: 25.8 % (ref 14.0–49.0)
MCH: 32.8 pg (ref 27.2–33.4)
MCHC: 32.8 g/dL (ref 32.0–36.0)
MCV: 100 fL — ABNORMAL HIGH (ref 79.3–98.0)
MONO#: 0.5 10*3/uL (ref 0.1–0.9)
MONO%: 12 % (ref 0.0–14.0)
NEUT%: 54.5 % (ref 39.0–75.0)
NEUTROS ABS: 2.1 10*3/uL (ref 1.5–6.5)
Platelets: 97 10*3/uL — ABNORMAL LOW (ref 140–400)
RBC: 3.35 10*6/uL — ABNORMAL LOW (ref 4.20–5.82)
RDW: 14.6 % (ref 11.0–14.6)
Retic %: 0.51 % — ABNORMAL LOW (ref 0.80–1.80)
Retic Ct Abs: 17.09 10*3/uL — ABNORMAL LOW (ref 34.80–93.90)
WBC: 3.8 10*3/uL — ABNORMAL LOW (ref 4.0–10.3)

## 2013-11-02 MED ORDER — EPOETIN ALFA 20000 UNIT/ML IJ SOLN: 20000.0000 [IU] | INTRAMUSCULAR | Status: DC

## 2013-11-09 ENCOUNTER — Ambulatory Visit: Payer: Commercial Managed Care - HMO

## 2013-11-09 ENCOUNTER — Other Ambulatory Visit (HOSPITAL_BASED_OUTPATIENT_CLINIC_OR_DEPARTMENT_OTHER): Payer: Commercial Managed Care - HMO

## 2013-11-09 ENCOUNTER — Telehealth: Payer: Self-pay | Admitting: *Deleted

## 2013-11-09 DIAGNOSIS — D469 Myelodysplastic syndrome, unspecified: Secondary | ICD-10-CM

## 2013-11-09 LAB — CBC & DIFF AND RETIC
BASO%: 1.1 % (ref 0.0–2.0)
BASOS ABS: 0.1 10*3/uL (ref 0.0–0.1)
EOS ABS: 0.4 10*3/uL (ref 0.0–0.5)
EOS%: 9 % — AB (ref 0.0–7.0)
HCT: 30.8 % — ABNORMAL LOW (ref 38.4–49.9)
HEMOGLOBIN: 10.2 g/dL — AB (ref 13.0–17.1)
IMMATURE RETIC FRACT: 2.6 % — AB (ref 3.00–10.60)
LYMPH#: 1.4 10*3/uL (ref 0.9–3.3)
LYMPH%: 32.4 % (ref 14.0–49.0)
MCH: 32.8 pg (ref 27.2–33.4)
MCHC: 33.1 g/dL (ref 32.0–36.0)
MCV: 99 fL — ABNORMAL HIGH (ref 79.3–98.0)
MONO#: 0.5 10*3/uL (ref 0.1–0.9)
MONO%: 10.8 % (ref 0.0–14.0)
NEUT%: 46.7 % (ref 39.0–75.0)
NEUTROS ABS: 2.1 10*3/uL (ref 1.5–6.5)
Platelets: 105 10*3/uL — ABNORMAL LOW (ref 140–400)
RBC: 3.11 10*6/uL — ABNORMAL LOW (ref 4.20–5.82)
RDW: 14.4 % (ref 11.0–14.6)
Retic %: 0.4 % — ABNORMAL LOW (ref 0.80–1.80)
Retic Ct Abs: 12.44 10*3/uL — ABNORMAL LOW (ref 34.80–93.90)
WBC: 4.4 10*3/uL (ref 4.0–10.3)

## 2013-11-09 MED ORDER — EPOETIN ALFA 20000 UNIT/ML IJ SOLN
20000.0000 [IU] | INTRAMUSCULAR | Status: DC
  Filled 2013-11-09: qty 1

## 2013-11-09 NOTE — Telephone Encounter (Signed)
Per Dr. Alvy Bimler cancel lab/injection for 5/8 and 5/15 since pt's labs have been good.  He is to return on 5/22 as scheduled for lab/MD and injection.   Informed wife and she verbalized understanding.

## 2013-11-14 ENCOUNTER — Other Ambulatory Visit: Payer: Self-pay | Admitting: *Deleted

## 2013-11-14 DIAGNOSIS — I2510 Atherosclerotic heart disease of native coronary artery without angina pectoris: Secondary | ICD-10-CM

## 2013-11-14 MED ORDER — AMIODARONE HCL 200 MG PO TABS: 100.0000 mg | ORAL_TABLET | Freq: Every day | ORAL | Status: DC

## 2013-11-14 MED ORDER — ISOSORBIDE MONONITRATE ER 120 MG PO TB24: 120.0000 mg | ORAL_TABLET | Freq: Every day | ORAL | Status: DC

## 2013-11-14 MED ORDER — CARVEDILOL 25 MG PO TABS: 25.0000 mg | ORAL_TABLET | Freq: Two times a day (BID) | ORAL | Status: DC

## 2013-11-14 MED ORDER — HYDRALAZINE HCL 50 MG PO TABS: 50.0000 mg | ORAL_TABLET | Freq: Three times a day (TID) | ORAL | Status: DC

## 2013-11-14 MED ORDER — CLOPIDOGREL BISULFATE 75 MG PO TABS: ORAL_TABLET | ORAL | Status: DC

## 2013-11-16 ENCOUNTER — Other Ambulatory Visit: Payer: Medicare HMO

## 2013-11-16 ENCOUNTER — Ambulatory Visit: Payer: Medicare HMO

## 2013-11-23 ENCOUNTER — Telehealth: Payer: Self-pay | Admitting: Interventional Cardiology

## 2013-11-23 ENCOUNTER — Other Ambulatory Visit: Payer: Medicare HMO

## 2013-11-23 ENCOUNTER — Ambulatory Visit: Payer: Medicare HMO

## 2013-11-23 DIAGNOSIS — I2510 Atherosclerotic heart disease of native coronary artery without angina pectoris: Secondary | ICD-10-CM

## 2013-11-23 MED ORDER — ISOSORBIDE MONONITRATE ER 120 MG PO TB24: 120.0000 mg | ORAL_TABLET | Freq: Every day | ORAL | Status: DC

## 2013-11-23 MED ORDER — CARVEDILOL 25 MG PO TABS: 25.0000 mg | ORAL_TABLET | Freq: Two times a day (BID) | ORAL | Status: DC

## 2013-11-23 MED ORDER — CLOPIDOGREL BISULFATE 75 MG PO TABS: ORAL_TABLET | ORAL | Status: DC

## 2013-11-23 MED ORDER — AMIODARONE HCL 200 MG PO TABS: 100.0000 mg | ORAL_TABLET | Freq: Every day | ORAL | Status: DC

## 2013-11-23 MED ORDER — HYDRALAZINE HCL 50 MG PO TABS: 50.0000 mg | ORAL_TABLET | Freq: Three times a day (TID) | ORAL | Status: DC

## 2013-11-23 NOTE — Telephone Encounter (Signed)
**Note De-Identified Shaida Route Obfuscation** The pt is aware that his Rx's have been sent to [facility] to fill. He verbalized understanding.

## 2013-11-23 NOTE — Telephone Encounter (Signed)
New problem   [employer] telephonic Rn called because pt called her and needs medication called in.  Per Rn pt said RN [facility].   But pt is out now and needs it called in to [facility].    LPAVIX, CARVEDILOL 25mg   And  ISOSORVIDE MNDR 120

## 2013-11-28 ENCOUNTER — Ambulatory Visit: Payer: Medicare HMO | Admitting: Hematology and Oncology

## 2013-11-30 ENCOUNTER — Telehealth: Payer: Self-pay | Admitting: Hematology and Oncology

## 2013-11-30 ENCOUNTER — Other Ambulatory Visit (HOSPITAL_BASED_OUTPATIENT_CLINIC_OR_DEPARTMENT_OTHER): Payer: Commercial Managed Care - HMO

## 2013-11-30 ENCOUNTER — Ambulatory Visit: Payer: Medicare HMO

## 2013-11-30 ENCOUNTER — Ambulatory Visit (HOSPITAL_BASED_OUTPATIENT_CLINIC_OR_DEPARTMENT_OTHER): Payer: Commercial Managed Care - HMO | Admitting: Hematology and Oncology

## 2013-11-30 VITALS — BP 140/61 | HR 60 | Temp 97.7°F | Resp 18 | Ht 70.0 in | Wt 170.9 lb

## 2013-11-30 DIAGNOSIS — D696 Thrombocytopenia, unspecified: Secondary | ICD-10-CM

## 2013-11-30 DIAGNOSIS — D469 Myelodysplastic syndrome, unspecified: Secondary | ICD-10-CM

## 2013-11-30 DIAGNOSIS — D649 Anemia, unspecified: Secondary | ICD-10-CM

## 2013-11-30 DIAGNOSIS — I129 Hypertensive chronic kidney disease with stage 1 through stage 4 chronic kidney disease, or unspecified chronic kidney disease: Secondary | ICD-10-CM

## 2013-11-30 DIAGNOSIS — C61 Malignant neoplasm of prostate: Secondary | ICD-10-CM

## 2013-11-30 DIAGNOSIS — N189 Chronic kidney disease, unspecified: Secondary | ICD-10-CM

## 2013-11-30 DIAGNOSIS — R609 Edema, unspecified: Secondary | ICD-10-CM

## 2013-11-30 LAB — CBC & DIFF AND RETIC
BASO%: 0.5 % (ref 0.0–2.0)
Basophils Absolute: 0 10*3/uL (ref 0.0–0.1)
EOS ABS: 0.3 10*3/uL (ref 0.0–0.5)
EOS%: 6.6 % (ref 0.0–7.0)
HCT: 28.7 % — ABNORMAL LOW (ref 38.4–49.9)
HGB: 9.6 g/dL — ABNORMAL LOW (ref 13.0–17.1)
Immature Retic Fract: 3.1 % (ref 3.00–10.60)
LYMPH%: 26.9 % (ref 14.0–49.0)
MCH: 32.9 pg (ref 27.2–33.4)
MCHC: 33.4 g/dL (ref 32.0–36.0)
MCV: 98.3 fL — AB (ref 79.3–98.0)
MONO#: 0.4 10*3/uL (ref 0.1–0.9)
MONO%: 10.1 % (ref 0.0–14.0)
NEUT#: 2.1 10*3/uL (ref 1.5–6.5)
NEUT%: 55.9 % (ref 39.0–75.0)
PLATELETS: 102 10*3/uL — AB (ref 140–400)
RBC: 2.92 10*6/uL — AB (ref 4.20–5.82)
RDW: 14.7 % — ABNORMAL HIGH (ref 11.0–14.6)
RETIC %: 0.72 % — AB (ref 0.80–1.80)
Retic Ct Abs: 21.02 10*3/uL — ABNORMAL LOW (ref 34.80–93.90)
WBC: 3.8 10*3/uL — ABNORMAL LOW (ref 4.0–10.3)
lymph#: 1 10*3/uL (ref 0.9–3.3)

## 2013-11-30 MED ORDER — EPOETIN ALFA 20000 UNIT/ML IJ SOLN
20000.0000 [IU] | INTRAMUSCULAR | Status: DC
  Administered 2013-11-30: 20000 [IU] via SUBCUTANEOUS
  Filled 2013-11-30: qty 1

## 2013-11-30 NOTE — Telephone Encounter (Signed)
gv pt appt schedule for may thru nov. per 5/22 pof lab due mthly w/inj. lb/fu could not be coord 11/13 w/inj due - NG out. per pt lb/fu/inj scheduled for 11/16.

## 2013-11-30 NOTE — Progress Notes (Signed)
[facility] OFFICE PROGRESS NOTE  Patient Care Team: Hulan Fess, MD as PCP - General (Family Medicine) Sinclair Grooms, MD (Cardiology) Winfield Cunas., MD (Gastroenterology) Arvil Persons, MD (Urology) Heath Lark, MD as Consulting Physician (Hematology and Oncology)  DIAGNOSIS: Low-grade myelodysplastic syndrome, history prostate cancer, on erythropoietin stimulating agents for chronic anemia  SUMMARY OF ONCOLOGIC HISTORY: This is a very interesting gentleman with a history of prostate cancer diagnosed in 1997, status post radiation and seed implantation and Lupron injection. The patient cannot recall the stage of his disease. He's been getting Lupron injection every 4 months. He does not recall his last PSA number but was never told he require urgent attention of further treatment apart from just getting Lupron injection. He was also found to have chronic anemia in the past and has been placed on oral ion supplements 4 years of which he took twice a day. His last ferritin level was over 1300. The patient also has extensive EGD and colonoscopy done which showed no evidence of active bleeding. He had B12 level checked recently and was found to have B12 deficiency and was started on B12 injections. The patient also has significant anemia requiring hospitalization and recent blood transfusion. On 05/10/2013 he had bone marrow aspirate and biopsy which is consistent with myelodysplastic syndrome. We started him on erythropoietin stimulating agents for anemia.   INTERVAL HISTORY: [name] MARIO Sr. 78 y.o. male returns for further followup. He is doing well. He has no new complaints. Denies any chest pain or shortness breath. He continued to have chronic bilateral lower extremity edema but denies recent exacerbation of congestive heart failure. No infection.  I have reviewed the past medical history, past surgical history, social history and family history with the patient  and they are unchanged from previous note.  ALLERGIES:  is allergic to ace inhibitors; ibuprofen; and nsaids.  MEDICATIONS:  Current Outpatient Prescriptions  Medication Sig Dispense Refill  . acetaminophen (ARTHRITIS PAIN RELIEF) 650 MG CR tablet Take 1,300 mg by mouth 2 (two) times daily.       Marland Kitchen amiodarone (PACERONE) 200 MG tablet Take 0.5 tablets (100 mg total) by mouth daily.  15 tablet  3  . amLODipine (NORVASC) 10 MG tablet Take 10 mg by mouth daily.      Marland Kitchen aspirin 325 MG tablet Take 325 mg by mouth daily.      Marland Kitchen atorvastatin (LIPITOR) 80 MG tablet Take 80 mg by mouth daily.      Marland Kitchen CALCIUM PO Take 2,000 mg by mouth daily.       . carvedilol (COREG) 25 MG tablet Take 1 tablet (25 mg total) by mouth 2 (two) times daily with a meal.  60 tablet  3  . cholecalciferol (VITAMIN D) 1000 UNITS tablet Take 1,000 Units by mouth daily.       . cloNIDine (CATAPRES) 0.2 MG tablet Take 0.2 mg by mouth 2 (two) times daily.       . clopidogrel (PLAVIX) 75 MG tablet TAKE ONE TABLET BY MOUTH ONCE DAILY  30 tablet  3  . Cyanocobalamin (VITAMIN B-12 IJ) Inject as directed.      . furosemide (LASIX) 80 MG tablet Take 1.5 tablets (120 mg total) by mouth 2 (two) times daily.  90 tablet  1  . hydrALAZINE (APRESOLINE) 50 MG tablet Take 1 tablet (50 mg total) by mouth 3 (three) times daily.  90 tablet  3  . isosorbide mononitrate (IMDUR) 120  MG 24 hr tablet Take 1 tablet (120 mg total) by mouth daily.  30 tablet  3  . pantoprazole (PROTONIX) 40 MG tablet Take 40 mg by mouth daily.      . potassium chloride SA (K-DUR,KLOR-CON) 20 MEQ tablet Take 20 mEq by mouth 2 (two) times daily.      . silodosin (RAPAFLO) 8 MG CAPS capsule Take 8 mg by mouth daily.        Current Facility-Administered Medications  Medication Dose Route Frequency Provider Last Rate Last Dose  . epoetin alfa (EPOGEN,PROCRIT) injection 20,000 Units  20,000 Units Subcutaneous Weekly Heath Lark, MD   20,000 Units at 11/30/13 1118    REVIEW OF  SYSTEMS:   Constitutional: Denies fevers, chills or abnormal weight loss Eyes: Denies blurriness of vision Ears, nose, mouth, throat, and face: Denies mucositis or sore throat Respiratory: Denies cough, dyspnea or wheezes Cardiovascular: Denies palpitation, chest discomfort or lower extremity swelling Gastrointestinal:  Denies nausea, heartburn or change in bowel habits Skin: Denies abnormal skin rashes Lymphatics: Denies new lymphadenopathy or easy bruising Neurological:Denies numbness, tingling or new weaknesses Behavioral/Psych: Mood is stable, no new changes  All other systems were reviewed with the patient and are negative.  PHYSICAL EXAMINATION: ECOG PERFORMANCE STATUS: 0 - Asymptomatic  Filed Vitals:   11/30/13 1107  BP: 140/61  Pulse: 60  Temp: 97.7 F (36.5 C)  Resp: 18   Filed Weights   11/30/13 1107  Weight: 170 lb 14.4 oz (77.52 kg)    GENERAL:alert, no distress and comfortable SKIN: skin color, texture, turgor are normal, no rashes or significant lesions EYES: normal, Conjunctiva are pale and non-injected, sclera clear OROPHARYNX:no exudate, no erythema and lips, buccal mucosa, and tongue normal  NECK: supple, thyroid normal size, non-tender, without nodularity LYMPH:  no palpable lymphadenopathy in the cervical, axillary or inguinal LUNGS: clear to auscultation and percussion with normal breathing effort HEART: regular rate & rhythm and no murmurs with mild bilateral lower extremity edema ABDOMEN:abdomen soft, non-tender and normal bowel sounds Musculoskeletal:no cyanosis of digits and no clubbing  NEURO: alert & oriented x 3 with fluent speech, no focal motor/sensory deficits  LABORATORY DATA:  I have reviewed the data as listed    Component Value Date/Time   NA 146* 08/10/2013 1324   NA 140 06/28/2013 1225   K 3.9 08/10/2013 1324   K 3.9 06/28/2013 1225   CL 106 06/28/2013 1225   CO2 27 08/10/2013 1324   CO2 24 06/28/2013 1225   GLUCOSE 105 08/10/2013  1324   GLUCOSE 134* 06/28/2013 1225   BUN 40.3* 08/10/2013 1324   BUN 31* 06/28/2013 1225   CREATININE 2.6* 08/10/2013 1324   CREATININE 2.9* 06/28/2013 1225   CALCIUM 9.4 08/10/2013 1324   CALCIUM 8.9 06/28/2013 1225   PROT 7.0 08/10/2013 1324   PROT 6.0 06/06/2013 0225   ALBUMIN 3.8 08/10/2013 1324   ALBUMIN 2.3* 06/12/2013 0542   AST 14 08/10/2013 1324   AST 21 06/06/2013 0225   ALT 8 08/10/2013 1324   ALT 16 06/06/2013 0225   ALKPHOS 47 08/10/2013 1324   ALKPHOS 63 06/06/2013 0225   BILITOT 0.33 08/10/2013 1324   BILITOT 0.3 06/06/2013 0225   GFRNONAA 19* 06/14/2013 0535   GFRAA 22* 06/14/2013 0535    No results found for this basename: SPEP,  UPEP,   kappa and lambda light chains    Lab Results  Component Value Date   WBC 3.8* 11/30/2013   NEUTROABS 2.1 11/30/2013  HGB 9.6* 11/30/2013   HCT 28.7* 11/30/2013   MCV 98.3* 11/30/2013   PLT 102* 11/30/2013      Chemistry      Component Value Date/Time   NA 146* 08/10/2013 1324   NA 140 06/28/2013 1225   K 3.9 08/10/2013 1324   K 3.9 06/28/2013 1225   CL 106 06/28/2013 1225   CO2 27 08/10/2013 1324   CO2 24 06/28/2013 1225   BUN 40.3* 08/10/2013 1324   BUN 31* 06/28/2013 1225   CREATININE 2.6* 08/10/2013 1324   CREATININE 2.9* 06/28/2013 1225      Component Value Date/Time   CALCIUM 9.4 08/10/2013 1324   CALCIUM 8.9 06/28/2013 1225   ALKPHOS 47 08/10/2013 1324   ALKPHOS 63 06/06/2013 0225   AST 14 08/10/2013 1324   AST 21 06/06/2013 0225   ALT 8 08/10/2013 1324   ALT 16 06/06/2013 0225   BILITOT 0.33 08/10/2013 1324   BILITOT 0.3 06/06/2013 0225     ASSESSMENT & PLAN:  #1 severe anemia #2 myelodysplastic syndrome #3 congestive heart failure This is likely anemia of chronic disease, as well as related to his myelodysplastic syndrome. The patient denies recent history of bleeding such as epistaxis, hematuria or hematochezia. He is asymptomatic from the anemia. We will observe for now.  He does not require transfusion now.  I  will continue his Procrit injection to 20,000 units every 4 weeks with goal to keep his hemoglobin greater than 10 g. I am scheduling him to come back for blood work every month If his blood count dropped to less than 7 or the patient has more symptoms of anemia, we will consider blood transfusions. #4 chronic kidney disease This is one of the major contributing factor as well to his anemia. His kidney function is currently stable #5 thrombocytopenia This is also related to his myelodysplastic syndrome. We will observe for now #6 hypertension His blood pressure is better today. We will monitor his blood pressure carefully while he is on erythropoietin stimulating agents. #7 history of prostate cancer His recent PSA is at 3. He will continue Lupron injection under the direction of his urologist.  Orders Placed This Encounter  Procedures  . CBC with Differential    Standing Status: Standing     Number of Occurrences: 4     Standing Expiration Date: 11/30/2014  . Morphology    Standing Status: Future     Number of Occurrences:      Standing Expiration Date: 11/30/2014   All questions were answered. The patient knows to call the clinic with any problems, questions or concerns. No barriers to learning was detected. I spent 15 minutes counseling the patient face to face. The total time spent in the appointment was 20 minutes and more than 50% was on counseling and review of test results     Heath Lark, MD 11/30/2013 11:23 AM

## 2013-12-05 ENCOUNTER — Other Ambulatory Visit: Payer: Self-pay

## 2013-12-07 ENCOUNTER — Ambulatory Visit (HOSPITAL_BASED_OUTPATIENT_CLINIC_OR_DEPARTMENT_OTHER): Payer: Commercial Managed Care - HMO

## 2013-12-07 VITALS — BP 112/47 | HR 60 | Temp 97.9°F

## 2013-12-07 DIAGNOSIS — D469 Myelodysplastic syndrome, unspecified: Secondary | ICD-10-CM

## 2013-12-07 DIAGNOSIS — D649 Anemia, unspecified: Secondary | ICD-10-CM

## 2013-12-07 LAB — CBC & DIFF AND RETIC
BASO%: 0.5 % (ref 0.0–2.0)
BASOS ABS: 0 10*3/uL (ref 0.0–0.1)
EOS%: 6.5 % (ref 0.0–7.0)
Eosinophils Absolute: 0.3 10*3/uL (ref 0.0–0.5)
HCT: 28.9 % — ABNORMAL LOW (ref 38.4–49.9)
HEMOGLOBIN: 9.5 g/dL — AB (ref 13.0–17.1)
Immature Retic Fract: 8.5 % (ref 3.00–10.60)
LYMPH%: 25.8 % (ref 14.0–49.0)
MCH: 32.6 pg (ref 27.2–33.4)
MCHC: 32.9 g/dL (ref 32.0–36.0)
MCV: 99.3 fL — AB (ref 79.3–98.0)
MONO#: 0.5 10*3/uL (ref 0.1–0.9)
MONO%: 12.3 % (ref 0.0–14.0)
NEUT#: 2.2 10*3/uL (ref 1.5–6.5)
NEUT%: 54.9 % (ref 39.0–75.0)
Platelets: 119 10*3/uL — ABNORMAL LOW (ref 140–400)
RBC: 2.91 10*6/uL — ABNORMAL LOW (ref 4.20–5.82)
RDW: 15.8 % — ABNORMAL HIGH (ref 11.0–14.6)
Retic %: 2.04 % — ABNORMAL HIGH (ref 0.80–1.80)
Retic Ct Abs: 59.36 10*3/uL (ref 34.80–93.90)
WBC: 4 10*3/uL (ref 4.0–10.3)
lymph#: 1 10*3/uL (ref 0.9–3.3)

## 2013-12-07 LAB — MORPHOLOGY: PLT EST: DECREASED

## 2013-12-07 MED ORDER — EPOETIN ALFA 20000 UNIT/ML IJ SOLN
20000.0000 [IU] | INTRAMUSCULAR | Status: DC
  Administered 2013-12-07: 20000 [IU] via SUBCUTANEOUS
  Filled 2013-12-07: qty 1

## 2013-12-07 NOTE — Patient Instructions (Signed)
Epoetin Alfa injection What is this medicine? EPOETIN ALFA (e POE e tin AL fa) helps your body make more red blood cells. This medicine is used to treat anemia caused by chronic kidney failure, cancer chemotherapy, or HIV-therapy. It may also be used before surgery if you have anemia. This medicine may be used for other purposes; ask your health care provider or pharmacist if you have questions. COMMON BRAND NAME(S): Epogen, Procrit What should I tell my health care provider before I take this medicine? They need to know if you have any of these conditions: -blood clotting disorders -cancer patient not on chemotherapy -cystic fibrosis -heart disease, such as angina or heart failure -hemoglobin level of 12 g/dL or greater -high blood pressure -low levels of folate, iron, or vitamin B12 -seizures -an unusual or allergic reaction to erythropoietin, albumin, benzyl alcohol, hamster proteins, other medicines, foods, dyes, or preservatives -pregnant or trying to get pregnant -breast-feeding How should I use this medicine? This medicine is for injection into a vein or under the skin. It is usually given by a health care professional in a hospital or clinic setting. If you get this medicine at home, you will be taught how to prepare and give this medicine. Use exactly as directed. Take your medicine at regular intervals. Do not take your medicine more often than directed. It is important that you put your used needles and syringes in a special sharps container. Do not put them in a trash can. If you do not have a sharps container, call your pharmacist or healthcare provider to get one. Talk to your pediatrician regarding the use of this medicine in children. While this drug may be prescribed for selected conditions, precautions do apply. Overdosage: If you think you have taken too much of this medicine contact a poison control center or emergency room at once. NOTE: This medicine is only for you. Do  not share this medicine with others. What if I miss a dose? If you miss a dose, take it as soon as you can. If it is almost time for your next dose, take only that dose. Do not take double or extra doses. What may interact with this medicine? Do not take this medicine with any of the following medications: -darbepoetin alfa This list may not describe all possible interactions. Give your health care provider a list of all the medicines, herbs, non-prescription drugs, or dietary supplements you use. Also tell them if you smoke, drink alcohol, or use illegal drugs. Some items may interact with your medicine. What should I watch for while using this medicine? Visit your prescriber or health care professional for regular checks on your progress and for the needed blood tests and blood pressure measurements. It is especially important for the doctor to make sure your hemoglobin level is in the desired range, to limit the risk of potential side effects and to give you the best benefit. Keep all appointments for any recommended tests. Check your blood pressure as directed. Ask your doctor what your blood pressure should be and when you should contact him or her. As your body makes more red blood cells, you may need to take iron, folic acid, or vitamin B supplements. Ask your doctor or health care provider which products are right for you. If you have kidney disease continue dietary restrictions, even though this medication can make you feel better. Talk with your doctor or health care professional about the foods you eat and the vitamins that you take. What   side effects may I notice from receiving this medicine? Side effects that you should report to your doctor or health care professional as soon as possible: -allergic reactions like skin rash, itching or hives, swelling of the face, lips, or tongue -breathing problems -changes in vision -chest pain -confusion, trouble speaking or understanding -feeling  faint or lightheaded, falls -high blood pressure -muscle aches or pains -pain, swelling, warmth in the leg -rapid weight gain -severe headaches -sudden numbness or weakness of the face, arm or leg -trouble walking, dizziness, loss of balance or coordination -seizures (convulsions) -swelling of the ankles, feet, hands -unusually weak or tired Side effects that usually do not require medical attention (report to your doctor or health care professional if they continue or are bothersome): -diarrhea -fever, chills (flu-like symptoms) -headaches -nausea, vomiting -redness, stinging, or swelling at site where injected This list may not describe all possible side effects. Call your doctor for medical advice about side effects. You may report side effects to FDA at 1-800-FDA-1088. Where should I keep my medicine? Keep out of the reach of children. Store in a refrigerator between 2 and 8 degrees C (36 and 46 degrees F). Do not freeze or shake. Throw away any unused portion if using a single-dose vial. Multi-dose vials can be kept in the refrigerator for up to 21 days after the initial dose. Throw away unused medicine. NOTE: This sheet is a summary. It may not cover all possible information. If you have questions about this medicine, talk to your doctor, pharmacist, or health care provider.  2014, Elsevier/Gold Standard. (2008-06-11 10:25:44)  

## 2014-01-04 ENCOUNTER — Ambulatory Visit (HOSPITAL_BASED_OUTPATIENT_CLINIC_OR_DEPARTMENT_OTHER): Payer: Commercial Managed Care - HMO

## 2014-01-04 ENCOUNTER — Other Ambulatory Visit: Payer: Self-pay | Admitting: Hematology and Oncology

## 2014-01-04 ENCOUNTER — Other Ambulatory Visit (HOSPITAL_BASED_OUTPATIENT_CLINIC_OR_DEPARTMENT_OTHER): Payer: Commercial Managed Care - HMO

## 2014-01-04 VITALS — BP 118/52 | HR 60 | Temp 97.8°F | Resp 15

## 2014-01-04 DIAGNOSIS — D469 Myelodysplastic syndrome, unspecified: Secondary | ICD-10-CM

## 2014-01-04 DIAGNOSIS — D649 Anemia, unspecified: Secondary | ICD-10-CM

## 2014-01-04 LAB — CBC & DIFF AND RETIC
BASO%: 0.7 % (ref 0.0–2.0)
Basophils Absolute: 0 10*3/uL (ref 0.0–0.1)
EOS%: 5.9 % (ref 0.0–7.0)
Eosinophils Absolute: 0.3 10*3/uL (ref 0.0–0.5)
HCT: 27.8 % — ABNORMAL LOW (ref 38.4–49.9)
HGB: 9.2 g/dL — ABNORMAL LOW (ref 13.0–17.1)
IMMATURE RETIC FRACT: 2.4 % — AB (ref 3.00–10.60)
LYMPH#: 1.1 10*3/uL (ref 0.9–3.3)
LYMPH%: 25.4 % (ref 14.0–49.0)
MCH: 33.3 pg (ref 27.2–33.4)
MCHC: 33.1 g/dL (ref 32.0–36.0)
MCV: 100.7 fL — ABNORMAL HIGH (ref 79.3–98.0)
MONO#: 0.5 10*3/uL (ref 0.1–0.9)
MONO%: 10.9 % (ref 0.0–14.0)
NEUT%: 57.1 % (ref 39.0–75.0)
NEUTROS ABS: 2.4 10*3/uL (ref 1.5–6.5)
Platelets: 114 10*3/uL — ABNORMAL LOW (ref 140–400)
RBC: 2.76 10*6/uL — AB (ref 4.20–5.82)
RDW: 15.3 % — ABNORMAL HIGH (ref 11.0–14.6)
RETIC %: 0.81 % (ref 0.80–1.80)
RETIC CT ABS: 22.36 10*3/uL — AB (ref 34.80–93.90)
WBC: 4.2 10*3/uL (ref 4.0–10.3)

## 2014-01-04 MED ORDER — EPOETIN ALFA 20000 UNIT/ML IJ SOLN
20000.0000 [IU] | INTRAMUSCULAR | Status: DC
  Administered 2014-01-04: 20000 [IU] via SUBCUTANEOUS
  Filled 2014-01-04: qty 1

## 2014-01-04 NOTE — Patient Instructions (Signed)

## 2014-02-01 ENCOUNTER — Ambulatory Visit (HOSPITAL_BASED_OUTPATIENT_CLINIC_OR_DEPARTMENT_OTHER): Payer: Commercial Managed Care - HMO

## 2014-02-01 ENCOUNTER — Other Ambulatory Visit (HOSPITAL_BASED_OUTPATIENT_CLINIC_OR_DEPARTMENT_OTHER): Payer: Commercial Managed Care - HMO

## 2014-02-01 VITALS — BP 116/52 | HR 59 | Temp 98.0°F

## 2014-02-01 DIAGNOSIS — D649 Anemia, unspecified: Secondary | ICD-10-CM

## 2014-02-01 DIAGNOSIS — D469 Myelodysplastic syndrome, unspecified: Secondary | ICD-10-CM

## 2014-02-01 LAB — CBC WITH DIFFERENTIAL/PLATELET
BASO%: 1.3 % (ref 0.0–2.0)
Basophils Absolute: 0.1 10*3/uL (ref 0.0–0.1)
EOS%: 6.2 % (ref 0.0–7.0)
Eosinophils Absolute: 0.3 10*3/uL (ref 0.0–0.5)
HCT: 28.2 % — ABNORMAL LOW (ref 38.4–49.9)
HGB: 9.2 g/dL — ABNORMAL LOW (ref 13.0–17.1)
LYMPH#: 1 10*3/uL (ref 0.9–3.3)
LYMPH%: 22.6 % (ref 14.0–49.0)
MCH: 32.9 pg (ref 27.2–33.4)
MCHC: 32.5 g/dL (ref 32.0–36.0)
MCV: 101.2 fL — ABNORMAL HIGH (ref 79.3–98.0)
MONO#: 0.6 10*3/uL (ref 0.1–0.9)
MONO%: 12.3 % (ref 0.0–14.0)
NEUT#: 2.6 10*3/uL (ref 1.5–6.5)
NEUT%: 57.6 % (ref 39.0–75.0)
Platelets: 98 10*3/uL — ABNORMAL LOW (ref 140–400)
RBC: 2.79 10*6/uL — AB (ref 4.20–5.82)
RDW: 15.1 % — AB (ref 11.0–14.6)
WBC: 4.5 10*3/uL (ref 4.0–10.3)

## 2014-02-01 MED ORDER — EPOETIN ALFA 20000 UNIT/ML IJ SOLN
20000.0000 [IU] | INTRAMUSCULAR | Status: DC
  Administered 2014-02-01: 20000 [IU] via SUBCUTANEOUS
  Filled 2014-02-01: qty 1

## 2014-02-15 ENCOUNTER — Other Ambulatory Visit: Payer: Self-pay | Admitting: *Deleted

## 2014-02-15 MED ORDER — ISOSORBIDE MONONITRATE ER 120 MG PO TB24
120.0000 mg | ORAL_TABLET | Freq: Every day | ORAL | Status: DC
Start: 2014-02-15 — End: 2014-05-04

## 2014-02-15 MED ORDER — AMIODARONE HCL 200 MG PO TABS
100.0000 mg | ORAL_TABLET | Freq: Every day | ORAL | Status: DC
Start: 2014-02-15 — End: 2014-04-18

## 2014-02-15 MED ORDER — CLOPIDOGREL BISULFATE 75 MG PO TABS: ORAL_TABLET | ORAL | Status: DC

## 2014-02-15 MED ORDER — CARVEDILOL 25 MG PO TABS: 25.0000 mg | ORAL_TABLET | Freq: Two times a day (BID) | ORAL | Status: DC

## 2014-03-01 ENCOUNTER — Ambulatory Visit: Payer: Commercial Managed Care - HMO

## 2014-03-01 ENCOUNTER — Other Ambulatory Visit (HOSPITAL_BASED_OUTPATIENT_CLINIC_OR_DEPARTMENT_OTHER): Payer: Commercial Managed Care - HMO

## 2014-03-01 DIAGNOSIS — D469 Myelodysplastic syndrome, unspecified: Secondary | ICD-10-CM

## 2014-03-01 DIAGNOSIS — D649 Anemia, unspecified: Secondary | ICD-10-CM

## 2014-03-01 LAB — CBC WITH DIFFERENTIAL/PLATELET
BASO%: 1.5 % (ref 0.0–2.0)
Basophils Absolute: 0.1 10*3/uL (ref 0.0–0.1)
EOS%: 7 % (ref 0.0–7.0)
Eosinophils Absolute: 0.3 10*3/uL (ref 0.0–0.5)
HCT: 31.1 % — ABNORMAL LOW (ref 38.4–49.9)
HGB: 10.1 g/dL — ABNORMAL LOW (ref 13.0–17.1)
LYMPH%: 31.6 % (ref 14.0–49.0)
MCH: 32.6 pg (ref 27.2–33.4)
MCHC: 32.3 g/dL (ref 32.0–36.0)
MCV: 100.8 fL — AB (ref 79.3–98.0)
MONO#: 0.5 10*3/uL (ref 0.1–0.9)
MONO%: 13.3 % (ref 0.0–14.0)
NEUT#: 1.9 10*3/uL (ref 1.5–6.5)
NEUT%: 46.6 % (ref 39.0–75.0)
Platelets: 113 10*3/uL — ABNORMAL LOW (ref 140–400)
RBC: 3.09 10*6/uL — AB (ref 4.20–5.82)
RDW: 14.7 % — ABNORMAL HIGH (ref 11.0–14.6)
WBC: 4 10*3/uL (ref 4.0–10.3)
lymph#: 1.3 10*3/uL (ref 0.9–3.3)

## 2014-03-01 MED ORDER — EPOETIN ALFA 20000 UNIT/ML IJ SOLN: 20000.0000 [IU] | INTRAMUSCULAR | Status: DC

## 2014-03-11 ENCOUNTER — Encounter: Payer: Self-pay | Admitting: Interventional Cardiology

## 2014-03-11 ENCOUNTER — Ambulatory Visit (INDEPENDENT_AMBULATORY_CARE_PROVIDER_SITE_OTHER): Payer: Commercial Managed Care - HMO | Admitting: *Deleted

## 2014-03-11 ENCOUNTER — Ambulatory Visit (INDEPENDENT_AMBULATORY_CARE_PROVIDER_SITE_OTHER): Payer: Commercial Managed Care - HMO | Admitting: Interventional Cardiology

## 2014-03-11 VITALS — BP 130/72 | HR 60 | Ht 70.0 in | Wt 166.0 lb

## 2014-03-11 DIAGNOSIS — I25709 Atherosclerosis of coronary artery bypass graft(s), unspecified, with unspecified angina pectoris: Secondary | ICD-10-CM

## 2014-03-11 DIAGNOSIS — I10 Essential (primary) hypertension: Secondary | ICD-10-CM

## 2014-03-11 DIAGNOSIS — N184 Chronic kidney disease, stage 4 (severe): Secondary | ICD-10-CM

## 2014-03-11 DIAGNOSIS — I209 Angina pectoris, unspecified: Secondary | ICD-10-CM

## 2014-03-11 DIAGNOSIS — I495 Sick sinus syndrome: Secondary | ICD-10-CM

## 2014-03-11 DIAGNOSIS — I4891 Unspecified atrial fibrillation: Secondary | ICD-10-CM | POA: Diagnosis not present

## 2014-03-11 DIAGNOSIS — E538 Deficiency of other specified B group vitamins: Secondary | ICD-10-CM

## 2014-03-11 DIAGNOSIS — I480 Paroxysmal atrial fibrillation: Secondary | ICD-10-CM

## 2014-03-11 DIAGNOSIS — I25810 Atherosclerosis of coronary artery bypass graft(s) without angina pectoris: Secondary | ICD-10-CM | POA: Diagnosis not present

## 2014-03-11 DIAGNOSIS — I5032 Chronic diastolic (congestive) heart failure: Secondary | ICD-10-CM

## 2014-03-11 LAB — MDC_IDC_ENUM_SESS_TYPE_INCLINIC
Battery Impedance: 3400 Ohm
Brady Statistic RA Percent Paced: 63 %
Date Time Interrogation Session: 20150831155638
Implantable Pulse Generator Model: 5376
Implantable Pulse Generator Serial Number: 971105
Lead Channel Impedance Value: 337 Ohm
Lead Channel Impedance Value: 403 Ohm
Lead Channel Pacing Threshold Amplitude: 1 V
Lead Channel Pacing Threshold Pulse Width: 0.5 ms
Lead Channel Pacing Threshold Pulse Width: 0.5 ms
Lead Channel Sensing Intrinsic Amplitude: 3.9 mV
Lead Channel Setting Pacing Amplitude: 2 V
Lead Channel Setting Pacing Pulse Width: 0.5 ms
MDC IDC MSMT BATTERY VOLTAGE: 2.76 V
MDC IDC MSMT LEADCHNL RA SENSING INTR AMPL: 1.8 mV
MDC IDC MSMT LEADCHNL RV PACING THRESHOLD AMPLITUDE: 1.88 V
MDC IDC SET LEADCHNL RV SENSING SENSITIVITY: 1.5 mV
MDC IDC STAT BRADY RV PERCENT PACED: 2.2 %

## 2014-03-11 NOTE — Patient Instructions (Signed)
Your physician recommends that you return for lab work today for [facility] and hepatic.  Your physician recommends that you continue on your current medications as directed. Please refer to the Current Medication list given to you today.  Your physician wants you to follow-up in: 6 months with Dr. Tamala Julian. You will receive a reminder letter in the mail two months in advance. If you don't receive a letter, please call our office to schedule the follow-up appointment.

## 2014-03-11 NOTE — Progress Notes (Signed)
Pacemaker check in clinic. Normal device function. Thresholds, sensing, impedances consistent with previous measurements. Device programmed to maximize longevity. 1 mode switch, < 1%, - coumadin.  No high ventricular rates noted. Device programmed at appropriate safety margins. Histogram distribution appropriate for patient activity level. Device programmed to optimize intrinsic conduction. Estimated longevity 2.75 years. ROV in February with Dr. Rayann Heman.

## 2014-03-11 NOTE — Progress Notes (Signed)
Patient ID: [name], male   DOB: 1935-07-31, 78 y.o.   MRN: [id number]    [address] [place], [place]  [address] Phone: [phone] Fax:  [phone]  Date:  03/11/2014   ID:  REACE BRESHEARS Sr., DOB Nov 28, 1935, MRN [id number]  PCP:  [name], MD   ASSESSMENT:  1. Paroxysmal atrial fibrillation, currently stable 2. Amiodarone therapy 3. Coronary artery disease stable without angina 4. Chronic diastolic heart failure  PLAN:  1. Check TSH and hepatic panel to rule out amiodarone toxicity 2. Clinical followup in 6 months 3. Continue to monitor weights and called of sudden increase or dyspnea 4. When the chest x-ray on next office visit   SUBJECTIVE: Kristy Catoe. is a 78 y.o. male is doing well. No significant dyspnea. Stable lower extremity edema. He has not had angina. Rare brief episodes of palpitation. He denies syncope no transient neurological symptoms.   Wt Readings from Last 3 Encounters:  03/11/14 166 lb (75.297 kg)  11/30/13 170 lb 14.4 oz (77.52 kg)  08/31/13 180 lb 4.8 oz (81.784 kg)     Past Medical History  Diagnosis Date  . Hypertension   . Hyperlipidemia   . Ulcer   . GERD (gastroesophageal reflux disease)   . Hemorrhoids   . Intestinal ischemia   . DVT (deep venous thrombosis) 2011    Right arm  . Atrial fibrillation   . Thrombocytopenia, unspecified 04/24/2013  . Gout   . Low back pain   . Depressive disorder   . Internal hemorrhoids without mention of complication   . Gastroparesis   . Chronic vascular insufficiency of intestine   . Osteopenia   . Ischemic colitis   . Chronic blood loss anemia   . Peripheral artery disease   . Diabetes mellitus type 2 with peripheral artery disease   . Chronic diastolic heart failure   . Liddle's syndrome   . Benign neoplasm of kidney, except pelvis   . Diabetes mellitus with kidney disease   . Renal artery stenosis   . CKD (chronic kidney disease)   .  Vitamin B 12 deficiency   . CAD (coronary artery disease)     s/b CABG 1994, and subsequent stents. Repeat CABG 12/2011,  . Prostate cancer 1997    XRT and lupron  . Myelodysplastic syndrome 05/22/2013    With low hemoglobin and platelets treated with Procrit  . Angiomyolipoma 2009    On both kidneys noted in 2009  . Sick sinus syndrome   . B12 deficiency 08/31/2013    Current Outpatient Prescriptions  Medication Sig Dispense Refill  . acetaminophen (ARTHRITIS PAIN RELIEF) 650 MG CR tablet Take 1,300 mg by mouth 2 (two) times daily.       Marland Kitchen amiodarone (PACERONE) 200 MG tablet Take 0.5 tablets (100 mg total) by mouth daily.  45 tablet  0  . amLODipine (NORVASC) 10 MG tablet Take 10 mg by mouth daily.      Marland Kitchen aspirin 325 MG tablet Take 325 mg by mouth daily.      Marland Kitchen atorvastatin (LIPITOR) 80 MG tablet Take 80 mg by mouth daily.      Marland Kitchen CALCIUM PO Take 2,000 mg by mouth daily.       . carvedilol (COREG) 25 MG tablet Take 1 tablet (25 mg total) by mouth 2 (two) times daily with a meal.  180 tablet  0  . cholecalciferol (VITAMIN D) 1000 UNITS tablet Take  1,000 Units by mouth daily.       . cloNIDine (CATAPRES) 0.2 MG tablet Take 0.2 mg by mouth 2 (two) times daily.       . clopidogrel (PLAVIX) 75 MG tablet TAKE ONE TABLET BY MOUTH ONCE DAILY  90 tablet  0  . Cyanocobalamin (VITAMIN B-12 IJ) Inject as directed.      . furosemide (LASIX) 80 MG tablet Take 1.5 tablets (120 mg total) by mouth 2 (two) times daily.  90 tablet  1  . hydrALAZINE (APRESOLINE) 50 MG tablet Take 1 tablet (50 mg total) by mouth 3 (three) times daily.  90 tablet  3  . isosorbide mononitrate (IMDUR) 120 MG 24 hr tablet Take 1 tablet (120 mg total) by mouth daily.  90 tablet  0  . ONE TOUCH ULTRA TEST test strip       . pantoprazole (PROTONIX) 40 MG tablet Take 40 mg by mouth daily.      . potassium chloride SA (K-DUR,KLOR-CON) 20 MEQ tablet Take 20 mEq by mouth 2 (two) times daily. Take 81meq in the am and pm and 74meq at  lunch      . silodosin (RAPAFLO) 8 MG CAPS capsule Take 8 mg by mouth daily.        No current facility-administered medications for this visit.    Allergies:    Allergies  Allergen Reactions  . Ace Inhibitors Cough  . Ibuprofen Other (See Comments)    GI Issues  . Nsaids Other (See Comments)    CKD    Social History:  The patient  reports that he quit smoking about 44 years ago. His smoking use included Cigarettes. He smoked 0.00 packs per day for 20 years. He has never used smokeless tobacco. He reports that he does not drink alcohol or use illicit drugs.   ROS:  Please see the history of present illness.   No blood in his urine or stool. Denies orthopnea. No transient neurological symptoms.   All other systems reviewed and negative.   OBJECTIVE: VS:  BP 130/72  Pulse 60  Ht 5\' 10"  (1.778 m)  Wt 166 lb (75.297 kg)  BMI 23.82 kg/m2 Well nourished, well developed, in no acute distress, elderly but in no distress HEENT: normal Neck: JVD moderate elevation. Carotid bruit absent  Cardiac:  normal S1, S2; RRR; no murmur Lungs:  clear to auscultation bilaterally, no wheezing, rhonchi or rales Abd: soft, nontender, no hepatomegaly Ext: Edema 1-2+ bilateral. Pulses diminished in the feet bilaterally Skin: warm and dry Neuro:  CNs 2-12 intact, no focal abnormalities noted  EKG:  Not performed       Signed, Illene Labrador III, MD 03/11/2014 4:58 PM

## 2014-03-12 LAB — HEPATIC FUNCTION PANEL
ALT: 9 U/L (ref 0–53)
AST: 22 U/L (ref 0–37)
Albumin: 4.3 g/dL (ref 3.5–5.2)
Alkaline Phosphatase: 43 U/L (ref 39–117)
BILIRUBIN DIRECT: 0.1 mg/dL (ref 0.0–0.3)
TOTAL PROTEIN: 7.5 g/dL (ref 6.0–8.3)
Total Bilirubin: 0.5 mg/dL (ref 0.2–1.2)

## 2014-03-12 LAB — TSH: TSH: 3.1 u[IU]/mL (ref 0.35–4.50)

## 2014-03-13 ENCOUNTER — Telehealth: Payer: Self-pay

## 2014-03-13 NOTE — Telephone Encounter (Signed)
pt aware of lab results.Labs are normal.pt verbalized understanding.

## 2014-03-13 NOTE — Telephone Encounter (Signed)
Message copied by Lamar Laundry on Wed Mar 13, 2014  4:30 PM ------      Message from: Daneen Schick      Created: Tue Mar 12, 2014  7:12 PM       Labs are normal. ------

## 2014-03-19 ENCOUNTER — Encounter: Payer: Self-pay | Admitting: Internal Medicine

## 2014-03-29 ENCOUNTER — Other Ambulatory Visit (HOSPITAL_BASED_OUTPATIENT_CLINIC_OR_DEPARTMENT_OTHER): Payer: Commercial Managed Care - HMO

## 2014-03-29 ENCOUNTER — Ambulatory Visit (HOSPITAL_BASED_OUTPATIENT_CLINIC_OR_DEPARTMENT_OTHER): Payer: Commercial Managed Care - HMO

## 2014-03-29 VITALS — BP 162/78 | HR 59 | Temp 98.3°F

## 2014-03-29 DIAGNOSIS — D469 Myelodysplastic syndrome, unspecified: Secondary | ICD-10-CM

## 2014-03-29 DIAGNOSIS — D649 Anemia, unspecified: Secondary | ICD-10-CM

## 2014-03-29 LAB — CBC WITH DIFFERENTIAL/PLATELET
BASO%: 1.1 % (ref 0.0–2.0)
Basophils Absolute: 0.1 10*3/uL (ref 0.0–0.1)
EOS%: 6.1 % (ref 0.0–7.0)
Eosinophils Absolute: 0.4 10*3/uL (ref 0.0–0.5)
HCT: 29.7 % — ABNORMAL LOW (ref 38.4–49.9)
HGB: 9.7 g/dL — ABNORMAL LOW (ref 13.0–17.1)
LYMPH%: 15.9 % (ref 14.0–49.0)
MCH: 32.6 pg (ref 27.2–33.4)
MCHC: 32.5 g/dL (ref 32.0–36.0)
MCV: 100.1 fL — AB (ref 79.3–98.0)
MONO#: 0.7 10*3/uL (ref 0.1–0.9)
MONO%: 11.2 % (ref 0.0–14.0)
NEUT%: 65.7 % (ref 39.0–75.0)
NEUTROS ABS: 4.2 10*3/uL (ref 1.5–6.5)
Platelets: 120 10*3/uL — ABNORMAL LOW (ref 140–400)
RBC: 2.96 10*6/uL — AB (ref 4.20–5.82)
RDW: 14.4 % (ref 11.0–14.6)
WBC: 6.4 10*3/uL (ref 4.0–10.3)
lymph#: 1 10*3/uL (ref 0.9–3.3)

## 2014-03-29 MED ORDER — EPOETIN ALFA 20000 UNIT/ML IJ SOLN
20000.0000 [IU] | INTRAMUSCULAR | Status: DC
  Administered 2014-03-29: 20000 [IU] via SUBCUTANEOUS
  Filled 2014-03-29: qty 1

## 2014-04-03 ENCOUNTER — Emergency Department (HOSPITAL_COMMUNITY): Payer: Commercial Managed Care - HMO

## 2014-04-03 ENCOUNTER — Encounter (HOSPITAL_COMMUNITY): Payer: Self-pay | Admitting: Emergency Medicine

## 2014-04-03 ENCOUNTER — Emergency Department (HOSPITAL_COMMUNITY)
Admission: EM | Admit: 2014-04-03 | Discharge: 2014-04-03 | Disposition: A | Discharge status: Home / Self Care | Payer: Commercial Managed Care - HMO | Attending: Emergency Medicine | Admitting: Emergency Medicine

## 2014-04-03 DIAGNOSIS — E785 Hyperlipidemia, unspecified: Secondary | ICD-10-CM | POA: Diagnosis not present

## 2014-04-03 DIAGNOSIS — Z86718 Personal history of other venous thrombosis and embolism: Secondary | ICD-10-CM | POA: Insufficient documentation

## 2014-04-03 DIAGNOSIS — Z9861 Coronary angioplasty status: Secondary | ICD-10-CM | POA: Insufficient documentation

## 2014-04-03 DIAGNOSIS — Z87891 Personal history of nicotine dependence: Secondary | ICD-10-CM | POA: Diagnosis not present

## 2014-04-03 DIAGNOSIS — Z7982 Long term (current) use of aspirin: Secondary | ICD-10-CM | POA: Diagnosis not present

## 2014-04-03 DIAGNOSIS — Z872 Personal history of diseases of the skin and subcutaneous tissue: Secondary | ICD-10-CM | POA: Diagnosis not present

## 2014-04-03 DIAGNOSIS — I4891 Unspecified atrial fibrillation: Secondary | ICD-10-CM | POA: Diagnosis not present

## 2014-04-03 DIAGNOSIS — I129 Hypertensive chronic kidney disease with stage 1 through stage 4 chronic kidney disease, or unspecified chronic kidney disease: Secondary | ICD-10-CM | POA: Insufficient documentation

## 2014-04-03 DIAGNOSIS — Z8546 Personal history of malignant neoplasm of prostate: Secondary | ICD-10-CM | POA: Insufficient documentation

## 2014-04-03 DIAGNOSIS — I2510 Atherosclerotic heart disease of native coronary artery without angina pectoris: Secondary | ICD-10-CM | POA: Insufficient documentation

## 2014-04-03 DIAGNOSIS — F329 Major depressive disorder, single episode, unspecified: Secondary | ICD-10-CM | POA: Insufficient documentation

## 2014-04-03 DIAGNOSIS — I5032 Chronic diastolic (congestive) heart failure: Secondary | ICD-10-CM | POA: Insufficient documentation

## 2014-04-03 DIAGNOSIS — N189 Chronic kidney disease, unspecified: Secondary | ICD-10-CM | POA: Insufficient documentation

## 2014-04-03 DIAGNOSIS — Z7902 Long term (current) use of antithrombotics/antiplatelets: Secondary | ICD-10-CM | POA: Insufficient documentation

## 2014-04-03 DIAGNOSIS — Z043 Encounter for examination and observation following other accident: Secondary | ICD-10-CM | POA: Insufficient documentation

## 2014-04-03 DIAGNOSIS — F3289 Other specified depressive episodes: Secondary | ICD-10-CM | POA: Insufficient documentation

## 2014-04-03 DIAGNOSIS — Z862 Personal history of diseases of the blood and blood-forming organs and certain disorders involving the immune mechanism: Secondary | ICD-10-CM | POA: Insufficient documentation

## 2014-04-03 DIAGNOSIS — Z79899 Other long term (current) drug therapy: Secondary | ICD-10-CM | POA: Diagnosis not present

## 2014-04-03 DIAGNOSIS — K219 Gastro-esophageal reflux disease without esophagitis: Secondary | ICD-10-CM | POA: Diagnosis not present

## 2014-04-03 DIAGNOSIS — Z950 Presence of cardiac pacemaker: Secondary | ICD-10-CM | POA: Insufficient documentation

## 2014-04-03 DIAGNOSIS — Y92410 Unspecified street and highway as the place of occurrence of the external cause: Secondary | ICD-10-CM | POA: Insufficient documentation

## 2014-04-03 DIAGNOSIS — Y9389 Activity, other specified: Secondary | ICD-10-CM | POA: Insufficient documentation

## 2014-04-03 DIAGNOSIS — Z8739 Personal history of other diseases of the musculoskeletal system and connective tissue: Secondary | ICD-10-CM | POA: Insufficient documentation

## 2014-04-03 LAB — CBC WITH DIFFERENTIAL/PLATELET
Basophils Absolute: 0.1 10*3/uL (ref 0.0–0.1)
Basophils Relative: 1 % (ref 0–1)
EOS ABS: 0.3 10*3/uL (ref 0.0–0.7)
Eosinophils Relative: 5 % (ref 0–5)
HCT: 31.4 % — ABNORMAL LOW (ref 39.0–52.0)
Hemoglobin: 10.6 g/dL — ABNORMAL LOW (ref 13.0–17.0)
Lymphocytes Relative: 20 % (ref 12–46)
Lymphs Abs: 1.3 10*3/uL (ref 0.7–4.0)
MCH: 32.9 pg (ref 26.0–34.0)
MCHC: 33.8 g/dL (ref 30.0–36.0)
MCV: 97.5 fL (ref 78.0–100.0)
Monocytes Absolute: 0.6 10*3/uL (ref 0.1–1.0)
Monocytes Relative: 9 % (ref 3–12)
NEUTROS PCT: 65 % (ref 43–77)
Neutro Abs: 4 10*3/uL (ref 1.7–7.7)
PLATELETS: 179 10*3/uL (ref 150–400)
RBC: 3.22 MIL/uL — ABNORMAL LOW (ref 4.22–5.81)
RDW: 14.6 % (ref 11.5–15.5)
WBC: 6.3 10*3/uL (ref 4.0–10.5)

## 2014-04-03 LAB — URINALYSIS, ROUTINE W REFLEX MICROSCOPIC
Bilirubin Urine: NEGATIVE
Glucose, UA: NEGATIVE mg/dL
KETONES UR: NEGATIVE mg/dL
LEUKOCYTES UA: NEGATIVE
NITRITE: NEGATIVE
PH: 7 (ref 5.0–8.0)
Protein, ur: 100 mg/dL — AB
Specific Gravity, Urine: 1.009 (ref 1.005–1.030)
Urobilinogen, UA: 0.2 mg/dL (ref 0.0–1.0)

## 2014-04-03 LAB — COMPREHENSIVE METABOLIC PANEL
ALT: 10 U/L (ref 0–53)
AST: 24 U/L (ref 0–37)
Albumin: 4.3 g/dL (ref 3.5–5.2)
Alkaline Phosphatase: 60 U/L (ref 39–117)
Anion gap: 16 — ABNORMAL HIGH (ref 5–15)
BUN: 35 mg/dL — ABNORMAL HIGH (ref 6–23)
CALCIUM: 9.6 mg/dL (ref 8.4–10.5)
CO2: 24 mEq/L (ref 19–32)
Chloride: 102 mEq/L (ref 96–112)
Creatinine, Ser: 2.52 mg/dL — ABNORMAL HIGH (ref 0.50–1.35)
GFR calc non Af Amer: 23 mL/min — ABNORMAL LOW (ref >90)
GFR, EST AFRICAN AMERICAN: 27 mL/min — AB (ref >90)
GLUCOSE: 135 mg/dL — AB (ref 70–99)
Potassium: 3.3 mEq/L — ABNORMAL LOW (ref 3.7–5.3)
SODIUM: 142 meq/L (ref 137–147)
Total Bilirubin: 0.3 mg/dL (ref 0.3–1.2)
Total Protein: 8.1 g/dL (ref 6.0–8.3)

## 2014-04-03 LAB — URINE MICROSCOPIC-ADD ON

## 2014-04-03 LAB — I-STAT TROPONIN, ED: Troponin i, poc: 0.01 ng/mL (ref 0.00–0.08)

## 2014-04-03 MED ORDER — HYDROCODONE-ACETAMINOPHEN 5-325 MG PO TABS: 1.0000 | ORAL_TABLET | Freq: Four times a day (QID) | ORAL | Status: DC | PRN

## 2014-04-03 MED ORDER — HYDROCODONE-ACETAMINOPHEN 5-325 MG PO TABS
2.0000 | ORAL_TABLET | Freq: Once | ORAL | Status: AC
  Administered 2014-04-03: 2 via ORAL
  Filled 2014-04-03: qty 2

## 2014-04-03 NOTE — ED Notes (Signed)
Darl Householder, MD notified that upon auscultation of bowel sounds, a swooshing sound can be heard in all quadrants, Darl Householder, Md at bedside agrees in assessment findings, [place] at bedside for MD
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
## 2014-04-03 NOTE — Discharge Instructions (Signed)
Take vicodin as needed for pain. Do NOT drive with it.   Take tylenol for mild pain.   Follow up with your doctor.   Return to ER if you have severe pain, chest pain, shortness of breath,.
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
## 2014-04-10 ENCOUNTER — Other Ambulatory Visit: Payer: Self-pay

## 2014-04-10 MED ORDER — FUROSEMIDE 80 MG PO TABS: 120.0000 mg | ORAL_TABLET | Freq: Two times a day (BID) | ORAL | Status: DC

## 2014-04-18 ENCOUNTER — Other Ambulatory Visit: Payer: Self-pay | Admitting: Interventional Cardiology

## 2014-04-26 ENCOUNTER — Other Ambulatory Visit (HOSPITAL_BASED_OUTPATIENT_CLINIC_OR_DEPARTMENT_OTHER): Payer: Commercial Managed Care - HMO

## 2014-04-26 ENCOUNTER — Ambulatory Visit (HOSPITAL_BASED_OUTPATIENT_CLINIC_OR_DEPARTMENT_OTHER): Payer: Commercial Managed Care - HMO

## 2014-04-26 VITALS — BP 139/62 | HR 59 | Temp 97.5°F

## 2014-04-26 DIAGNOSIS — D469 Myelodysplastic syndrome, unspecified: Secondary | ICD-10-CM

## 2014-04-26 DIAGNOSIS — D649 Anemia, unspecified: Secondary | ICD-10-CM

## 2014-04-26 LAB — CBC & DIFF AND RETIC
BASO%: 0.7 % (ref 0.0–2.0)
BASOS ABS: 0 10*3/uL (ref 0.0–0.1)
EOS%: 3.9 % (ref 0.0–7.0)
Eosinophils Absolute: 0.2 10*3/uL (ref 0.0–0.5)
HEMATOCRIT: 26.1 % — AB (ref 38.4–49.9)
HEMOGLOBIN: 8.3 g/dL — AB (ref 13.0–17.1)
Immature Retic Fract: 4.2 % (ref 3.00–10.60)
LYMPH%: 21.9 % (ref 14.0–49.0)
MCH: 31.6 pg (ref 27.2–33.4)
MCHC: 31.8 g/dL — ABNORMAL LOW (ref 32.0–36.0)
MCV: 99.2 fL — ABNORMAL HIGH (ref 79.3–98.0)
MONO#: 0.6 10*3/uL (ref 0.1–0.9)
MONO%: 12 % (ref 0.0–14.0)
NEUT#: 2.8 10*3/uL (ref 1.5–6.5)
NEUT%: 61.5 % (ref 39.0–75.0)
PLATELETS: 129 10*3/uL — AB (ref 140–400)
RBC: 2.63 10*6/uL — ABNORMAL LOW (ref 4.20–5.82)
RDW: 15.9 % — AB (ref 11.0–14.6)
Retic %: 1.4 % (ref 0.80–1.80)
Retic Ct Abs: 36.82 10*3/uL (ref 34.80–93.90)
WBC: 4.6 10*3/uL (ref 4.0–10.3)
lymph#: 1 10*3/uL (ref 0.9–3.3)
nRBC: 0 % (ref 0–0)

## 2014-04-26 MED ORDER — EPOETIN ALFA 20000 UNIT/ML IJ SOLN
20000.0000 [IU] | INTRAMUSCULAR | Status: DC
  Administered 2014-04-26: 20000 [IU] via SUBCUTANEOUS
  Filled 2014-04-26: qty 1

## 2014-04-26 NOTE — Patient Instructions (Signed)

## 2014-05-04 ENCOUNTER — Other Ambulatory Visit: Payer: Self-pay | Admitting: Interventional Cardiology

## 2014-05-14 ENCOUNTER — Encounter: Payer: Self-pay | Admitting: Interventional Cardiology

## 2014-05-23 ENCOUNTER — Other Ambulatory Visit: Payer: Self-pay | Admitting: Interventional Cardiology

## 2014-05-27 ENCOUNTER — Telehealth: Payer: Self-pay | Admitting: Hematology and Oncology

## 2014-05-27 ENCOUNTER — Other Ambulatory Visit (HOSPITAL_BASED_OUTPATIENT_CLINIC_OR_DEPARTMENT_OTHER): Payer: Commercial Managed Care - HMO

## 2014-05-27 ENCOUNTER — Ambulatory Visit (HOSPITAL_BASED_OUTPATIENT_CLINIC_OR_DEPARTMENT_OTHER): Payer: Commercial Managed Care - HMO | Admitting: Hematology and Oncology

## 2014-05-27 ENCOUNTER — Ambulatory Visit (HOSPITAL_BASED_OUTPATIENT_CLINIC_OR_DEPARTMENT_OTHER): Payer: Commercial Managed Care - HMO

## 2014-05-27 VITALS — BP 124/59 | HR 59 | Temp 97.6°F | Resp 18 | Ht 70.0 in | Wt 164.4 lb

## 2014-05-27 DIAGNOSIS — D72819 Decreased white blood cell count, unspecified: Secondary | ICD-10-CM | POA: Insufficient documentation

## 2014-05-27 DIAGNOSIS — E538 Deficiency of other specified B group vitamins: Secondary | ICD-10-CM

## 2014-05-27 DIAGNOSIS — D696 Thrombocytopenia, unspecified: Secondary | ICD-10-CM

## 2014-05-27 DIAGNOSIS — D469 Myelodysplastic syndrome, unspecified: Secondary | ICD-10-CM

## 2014-05-27 DIAGNOSIS — Z8546 Personal history of malignant neoplasm of prostate: Secondary | ICD-10-CM

## 2014-05-27 LAB — CBC & DIFF AND RETIC
BASO%: 0.5 % (ref 0.0–2.0)
Basophils Absolute: 0 10*3/uL (ref 0.0–0.1)
EOS%: 5.8 % (ref 0.0–7.0)
Eosinophils Absolute: 0.2 10*3/uL (ref 0.0–0.5)
HCT: 27.9 % — ABNORMAL LOW (ref 38.4–49.9)
HEMOGLOBIN: 8.8 g/dL — AB (ref 13.0–17.1)
Immature Retic Fract: 3.6 % (ref 3.00–10.60)
LYMPH%: 28.6 % (ref 14.0–49.0)
MCH: 32.6 pg (ref 27.2–33.4)
MCHC: 31.5 g/dL — ABNORMAL LOW (ref 32.0–36.0)
MCV: 103.3 fL — AB (ref 79.3–98.0)
MONO#: 0.5 10*3/uL (ref 0.1–0.9)
MONO%: 12.2 % (ref 0.0–14.0)
NEUT%: 52.9 % (ref 39.0–75.0)
NEUTROS ABS: 2 10*3/uL (ref 1.5–6.5)
PLATELETS: 108 10*3/uL — AB (ref 140–400)
RBC: 2.7 10*6/uL — ABNORMAL LOW (ref 4.20–5.82)
RDW: 16.1 % — ABNORMAL HIGH (ref 11.0–14.6)
Retic %: 0.9 % (ref 0.80–1.80)
Retic Ct Abs: 24.3 10*3/uL — ABNORMAL LOW (ref 34.80–93.90)
WBC: 3.8 10*3/uL — ABNORMAL LOW (ref 4.0–10.3)
lymph#: 1.1 10*3/uL (ref 0.9–3.3)

## 2014-05-27 MED ORDER — EPOETIN ALFA 20000 UNIT/ML IJ SOLN
20000.0000 [IU] | INTRAMUSCULAR | Status: DC
  Administered 2014-05-27: 20000 [IU] via SUBCUTANEOUS
  Filled 2014-05-27: qty 1

## 2014-05-27 NOTE — Telephone Encounter (Signed)
Pt confirmed labs/ov per 11/12 POF, gave pt AVS...... KJ °

## 2014-05-27 NOTE — Assessment & Plan Note (Signed)
The patient will continue on B 12 replacement therapy.

## 2014-05-27 NOTE — Assessment & Plan Note (Signed)
This is due to his underlying bone marrow disorder. He is not symptomatic. Recommend close observation. 

## 2014-05-27 NOTE — Assessment & Plan Note (Signed)
This is due to his underlying bone marrow disorder. He saw symptomatic. Recommend close observation.

## 2014-05-27 NOTE — Assessment & Plan Note (Signed)
The patient has been getting erythropoietin stimulating agents for a while. Previously, with excellent response to treatment, we reduced the frequency of injection to every 4 weeks. Now, I will plan to increase to frequency of injection to every 3 weeks due to persistent anemia and he agreed to proceed.

## 2014-05-27 NOTE — Patient Instructions (Signed)
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
## 2014-06-17 ENCOUNTER — Ambulatory Visit (HOSPITAL_BASED_OUTPATIENT_CLINIC_OR_DEPARTMENT_OTHER): Payer: Commercial Managed Care - HMO

## 2014-06-17 ENCOUNTER — Other Ambulatory Visit (HOSPITAL_BASED_OUTPATIENT_CLINIC_OR_DEPARTMENT_OTHER): Payer: Commercial Managed Care - HMO

## 2014-06-17 DIAGNOSIS — D469 Myelodysplastic syndrome, unspecified: Secondary | ICD-10-CM

## 2014-06-17 LAB — CBC & DIFF AND RETIC
BASO%: 0 % (ref 0.0–2.0)
Basophils Absolute: 0 10*3/uL (ref 0.0–0.1)
EOS ABS: 0 10*3/uL (ref 0.0–0.5)
EOS%: 0.6 % (ref 0.0–7.0)
HCT: 28.6 % — ABNORMAL LOW (ref 38.4–49.9)
HGB: 9.1 g/dL — ABNORMAL LOW (ref 13.0–17.1)
Immature Retic Fract: 1.5 % — ABNORMAL LOW (ref 3.00–10.60)
LYMPH%: 28.6 % (ref 14.0–49.0)
MCH: 33 pg (ref 27.2–33.4)
MCHC: 31.8 g/dL — ABNORMAL LOW (ref 32.0–36.0)
MCV: 103.6 fL — AB (ref 79.3–98.0)
MONO#: 0.6 10*3/uL (ref 0.1–0.9)
MONO%: 11.9 % (ref 0.0–14.0)
NEUT%: 58.9 % (ref 39.0–75.0)
NEUTROS ABS: 3.1 10*3/uL (ref 1.5–6.5)
Platelets: 105 10*3/uL — ABNORMAL LOW (ref 140–400)
RBC: 2.76 10*6/uL — ABNORMAL LOW (ref 4.20–5.82)
RDW: 15.6 % — AB (ref 11.0–14.6)
RETIC CT ABS: 22.63 10*3/uL — AB (ref 34.80–93.90)
Retic %: 0.82 % (ref 0.80–1.80)
WBC: 5.2 10*3/uL (ref 4.0–10.3)
lymph#: 1.5 10*3/uL (ref 0.9–3.3)

## 2014-06-17 LAB — HOLD TUBE, BLOOD BANK

## 2014-06-17 MED ORDER — EPOETIN ALFA 20000 UNIT/ML IJ SOLN
20000.0000 [IU] | INTRAMUSCULAR | Status: DC
  Administered 2014-06-17: 20000 [IU] via SUBCUTANEOUS
  Filled 2014-06-17: qty 1

## 2014-06-20 ENCOUNTER — Encounter (HOSPITAL_COMMUNITY): Payer: Self-pay | Admitting: Surgery

## 2014-06-26 ENCOUNTER — Telehealth: Payer: Self-pay | Admitting: Hematology and Oncology

## 2014-06-26 NOTE — Telephone Encounter (Signed)
returned call and s.w pt and r/s appt to later time per pt request....pt ok and aware

## 2014-07-08 ENCOUNTER — Ambulatory Visit: Payer: Commercial Managed Care - HMO

## 2014-07-08 ENCOUNTER — Other Ambulatory Visit (HOSPITAL_COMMUNITY): Payer: Self-pay | Admitting: Urology

## 2014-07-08 ENCOUNTER — Other Ambulatory Visit (HOSPITAL_BASED_OUTPATIENT_CLINIC_OR_DEPARTMENT_OTHER): Payer: Commercial Managed Care - HMO

## 2014-07-08 ENCOUNTER — Other Ambulatory Visit: Payer: Commercial Managed Care - HMO

## 2014-07-08 DIAGNOSIS — C61 Malignant neoplasm of prostate: Secondary | ICD-10-CM

## 2014-07-08 DIAGNOSIS — D469 Myelodysplastic syndrome, unspecified: Secondary | ICD-10-CM

## 2014-07-08 LAB — CBC & DIFF AND RETIC
BASO%: 0.8 % (ref 0.0–2.0)
BASOS ABS: 0 10*3/uL (ref 0.0–0.1)
EOS%: 5.1 % (ref 0.0–7.0)
Eosinophils Absolute: 0.2 10*3/uL (ref 0.0–0.5)
HCT: 34.3 % — ABNORMAL LOW (ref 38.4–49.9)
HEMOGLOBIN: 10.9 g/dL — AB (ref 13.0–17.1)
IMMATURE RETIC FRACT: 1.8 % — AB (ref 3.00–10.60)
LYMPH#: 1.4 10*3/uL (ref 0.9–3.3)
LYMPH%: 28.7 % (ref 14.0–49.0)
MCH: 33 pg (ref 27.2–33.4)
MCHC: 31.8 g/dL — AB (ref 32.0–36.0)
MCV: 103.9 fL — ABNORMAL HIGH (ref 79.3–98.0)
MONO#: 0.4 10*3/uL (ref 0.1–0.9)
MONO%: 7.6 % (ref 0.0–14.0)
NEUT#: 2.7 10*3/uL (ref 1.5–6.5)
NEUT%: 57.8 % (ref 39.0–75.0)
Platelets: 111 10*3/uL — ABNORMAL LOW (ref 140–400)
RBC: 3.3 10*6/uL — ABNORMAL LOW (ref 4.20–5.82)
RDW: 15.5 % — AB (ref 11.0–14.6)
Retic %: 0.56 % — ABNORMAL LOW (ref 0.80–1.80)
Retic Ct Abs: 18.48 10*3/uL — ABNORMAL LOW (ref 34.80–93.90)
WBC: 4.7 10*3/uL (ref 4.0–10.3)

## 2014-07-08 NOTE — Progress Notes (Signed)
Patient hgb:10.9. Per Dr Alvy Bimler patient can have injection as long as systolic bp is <961 or he can skip this dose if he feels ok.  Patient states he feels "good" and does not want injection.

## 2014-07-09 LAB — HOLD TUBE, BLOOD BANK

## 2014-07-22 ENCOUNTER — Encounter (HOSPITAL_COMMUNITY)
Admission: RE | Admit: 2014-07-22 | Discharge: 2014-07-22 | Disposition: A | Discharge status: Home / Self Care | Payer: Medicare Other | Source: Ambulatory Visit | Attending: Urology | Admitting: Urology

## 2014-07-22 ENCOUNTER — Other Ambulatory Visit: Payer: Self-pay

## 2014-07-22 ENCOUNTER — Telehealth: Payer: Self-pay | Admitting: Interventional Cardiology

## 2014-07-22 DIAGNOSIS — C61 Malignant neoplasm of prostate: Secondary | ICD-10-CM | POA: Diagnosis not present

## 2014-07-22 MED ORDER — ISOSORBIDE MONONITRATE ER 120 MG PO TB24: 120.0000 mg | ORAL_TABLET | Freq: Every day | ORAL | Status: DC

## 2014-07-22 MED ORDER — CLOPIDOGREL BISULFATE 75 MG PO TABS: 75.0000 mg | ORAL_TABLET | Freq: Every day | ORAL | Status: DC

## 2014-07-22 MED ORDER — TECHNETIUM TC 99M MEDRONATE IV KIT
25.2000 | PACK | Freq: Once | INTRAVENOUS | Status: AC | PRN
  Administered 2014-07-22: 25.2 via INTRAVENOUS

## 2014-07-22 MED ORDER — CARVEDILOL 25 MG PO TABS: ORAL_TABLET | ORAL | Status: DC

## 2014-07-22 MED ORDER — FUROSEMIDE 80 MG PO TABS: ORAL_TABLET | ORAL | Status: DC

## 2014-07-22 MED ORDER — AMIODARONE HCL 200 MG PO TABS: 100.0000 mg | ORAL_TABLET | Freq: Every day | ORAL | Status: DC

## 2014-07-22 NOTE — Telephone Encounter (Signed)
Walk in pt form " mail orders" gave to lisa

## 2014-07-22 NOTE — Telephone Encounter (Signed)
Medication refill request received. Pt walked in a hand written note Amiodarone, Clopidogrel, Isosorbide, Furosemide, Carvedilol Refilled and sent to [facility]

## 2014-07-29 ENCOUNTER — Ambulatory Visit (HOSPITAL_BASED_OUTPATIENT_CLINIC_OR_DEPARTMENT_OTHER): Payer: Medicare Other

## 2014-07-29 ENCOUNTER — Other Ambulatory Visit (HOSPITAL_BASED_OUTPATIENT_CLINIC_OR_DEPARTMENT_OTHER): Payer: Medicare Other

## 2014-07-29 DIAGNOSIS — D649 Anemia, unspecified: Secondary | ICD-10-CM

## 2014-07-29 DIAGNOSIS — D469 Myelodysplastic syndrome, unspecified: Secondary | ICD-10-CM

## 2014-07-29 LAB — HOLD TUBE, BLOOD BANK

## 2014-07-29 LAB — CBC WITH DIFFERENTIAL/PLATELET
BASO%: 1.9 % (ref 0.0–2.0)
Basophils Absolute: 0.1 10e3/uL (ref 0.0–0.1)
EOS%: 6.9 % (ref 0.0–7.0)
Eosinophils Absolute: 0.3 10e3/uL (ref 0.0–0.5)
HCT: 30.8 % — ABNORMAL LOW (ref 38.4–49.9)
HGB: 9.8 g/dL — ABNORMAL LOW (ref 13.0–17.1)
LYMPH%: 25.6 % (ref 14.0–49.0)
MCH: 32.4 pg (ref 27.2–33.4)
MCHC: 32 g/dL (ref 32.0–36.0)
MCV: 101.4 fL — ABNORMAL HIGH (ref 79.3–98.0)
MONO#: 0.5 10e3/uL (ref 0.1–0.9)
MONO%: 13 % (ref 0.0–14.0)
NEUT#: 1.9 10e3/uL (ref 1.5–6.5)
NEUT%: 52.6 % (ref 39.0–75.0)
Platelets: 121 10e3/uL — ABNORMAL LOW (ref 140–400)
RBC: 3.04 10e6/uL — ABNORMAL LOW (ref 4.20–5.82)
RDW: 15 % — ABNORMAL HIGH (ref 11.0–14.6)
WBC: 3.6 10e3/uL — ABNORMAL LOW (ref 4.0–10.3)
lymph#: 0.9 10e3/uL (ref 0.9–3.3)

## 2014-07-29 MED ORDER — EPOETIN ALFA 20000 UNIT/ML IJ SOLN
20000.0000 [IU] | INTRAMUSCULAR | Status: DC
  Administered 2014-07-29: 20000 [IU] via SUBCUTANEOUS
  Filled 2014-07-29: qty 1

## 2014-08-19 ENCOUNTER — Ambulatory Visit: Payer: Medicare Other

## 2014-08-19 ENCOUNTER — Other Ambulatory Visit (HOSPITAL_BASED_OUTPATIENT_CLINIC_OR_DEPARTMENT_OTHER): Payer: Medicare Other

## 2014-08-19 DIAGNOSIS — D469 Myelodysplastic syndrome, unspecified: Secondary | ICD-10-CM

## 2014-08-19 DIAGNOSIS — E538 Deficiency of other specified B group vitamins: Secondary | ICD-10-CM

## 2014-08-19 LAB — CBC WITH DIFFERENTIAL/PLATELET
BASO%: 2 % (ref 0.0–2.0)
BASOS ABS: 0.1 10*3/uL (ref 0.0–0.1)
EOS%: 7.3 % — ABNORMAL HIGH (ref 0.0–7.0)
Eosinophils Absolute: 0.3 10*3/uL (ref 0.0–0.5)
HCT: 32.1 % — ABNORMAL LOW (ref 38.4–49.9)
HEMOGLOBIN: 10.3 g/dL — AB (ref 13.0–17.1)
LYMPH#: 1.1 10*3/uL (ref 0.9–3.3)
LYMPH%: 26.8 % (ref 14.0–49.0)
MCH: 32.4 pg (ref 27.2–33.4)
MCHC: 32.2 g/dL (ref 32.0–36.0)
MCV: 100.8 fL — ABNORMAL HIGH (ref 79.3–98.0)
MONO#: 0.4 10*3/uL (ref 0.1–0.9)
MONO%: 10.8 % (ref 0.0–14.0)
NEUT%: 53.1 % (ref 39.0–75.0)
NEUTROS ABS: 2.2 10*3/uL (ref 1.5–6.5)
Platelets: 113 10*3/uL — ABNORMAL LOW (ref 140–400)
RBC: 3.19 10*6/uL — AB (ref 4.20–5.82)
RDW: 15.3 % — ABNORMAL HIGH (ref 11.0–14.6)
WBC: 4.1 10*3/uL (ref 4.0–10.3)

## 2014-08-19 LAB — IRON AND TIBC CHCC
%SAT: 40 % (ref 20–55)
IRON: 74 ug/dL (ref 42–163)
TIBC: 186 ug/dL — ABNORMAL LOW (ref 202–409)
UIBC: 113 ug/dL — AB (ref 117–376)

## 2014-08-19 LAB — FERRITIN CHCC: FERRITIN: 679 ng/mL — AB (ref 22–316)

## 2014-08-19 LAB — VITAMIN B12: Vitamin B-12: >2000 pg/mL — ABNORMAL HIGH (ref 211–911)

## 2014-08-19 MED ORDER — EPOETIN ALFA 20000 UNIT/ML IJ SOLN
20000.0000 [IU] | INTRAMUSCULAR | Status: DC
  Filled 2014-08-19: qty 1

## 2014-09-04 ENCOUNTER — Encounter: Payer: Self-pay | Admitting: *Deleted

## 2014-09-09 ENCOUNTER — Other Ambulatory Visit (HOSPITAL_BASED_OUTPATIENT_CLINIC_OR_DEPARTMENT_OTHER): Payer: Medicare Other

## 2014-09-09 ENCOUNTER — Ambulatory Visit (HOSPITAL_BASED_OUTPATIENT_CLINIC_OR_DEPARTMENT_OTHER): Payer: Medicare Other

## 2014-09-09 DIAGNOSIS — D4620 Refractory anemia with excess of blasts, unspecified: Secondary | ICD-10-CM

## 2014-09-09 DIAGNOSIS — D469 Myelodysplastic syndrome, unspecified: Secondary | ICD-10-CM

## 2014-09-09 LAB — CBC WITH DIFFERENTIAL/PLATELET
BASO%: 0.8 % (ref 0.0–2.0)
BASOS ABS: 0 10*3/uL (ref 0.0–0.1)
EOS%: 3.8 % (ref 0.0–7.0)
Eosinophils Absolute: 0.2 10*3/uL (ref 0.0–0.5)
HEMATOCRIT: 28.5 % — AB (ref 38.4–49.9)
HGB: 9.3 g/dL — ABNORMAL LOW (ref 13.0–17.1)
LYMPH%: 24.6 % (ref 14.0–49.0)
MCH: 33 pg (ref 27.2–33.4)
MCHC: 32.6 g/dL (ref 32.0–36.0)
MCV: 101.1 fL — AB (ref 79.3–98.0)
MONO#: 0.5 10*3/uL (ref 0.1–0.9)
MONO%: 10.6 % (ref 0.0–14.0)
NEUT#: 3 10*3/uL (ref 1.5–6.5)
NEUT%: 60.2 % (ref 39.0–75.0)
Platelets: 94 10*3/uL — ABNORMAL LOW (ref 140–400)
RBC: 2.82 10*6/uL — AB (ref 4.20–5.82)
RDW: 14.8 % — ABNORMAL HIGH (ref 11.0–14.6)
WBC: 5 10*3/uL (ref 4.0–10.3)
lymph#: 1.2 10*3/uL (ref 0.9–3.3)

## 2014-09-09 MED ORDER — EPOETIN ALFA 20000 UNIT/ML IJ SOLN
20000.0000 [IU] | INTRAMUSCULAR | Status: DC
  Administered 2014-09-09: 20000 [IU] via SUBCUTANEOUS
  Filled 2014-09-09: qty 1
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
## 2014-09-10 ENCOUNTER — Encounter: Payer: Self-pay | Admitting: Interventional Cardiology

## 2014-09-10 ENCOUNTER — Ambulatory Visit (INDEPENDENT_AMBULATORY_CARE_PROVIDER_SITE_OTHER): Payer: Medicare Other | Admitting: Interventional Cardiology

## 2014-09-10 VITALS — BP 134/62 | HR 59 | Ht 70.0 in | Wt 159.4 lb

## 2014-09-10 DIAGNOSIS — I480 Paroxysmal atrial fibrillation: Secondary | ICD-10-CM

## 2014-09-10 DIAGNOSIS — Z950 Presence of cardiac pacemaker: Secondary | ICD-10-CM

## 2014-09-10 DIAGNOSIS — Z79899 Other long term (current) drug therapy: Secondary | ICD-10-CM

## 2014-09-10 DIAGNOSIS — I10 Essential (primary) hypertension: Secondary | ICD-10-CM

## 2014-09-10 DIAGNOSIS — I495 Sick sinus syndrome: Secondary | ICD-10-CM

## 2014-09-10 DIAGNOSIS — I5032 Chronic diastolic (congestive) heart failure: Secondary | ICD-10-CM

## 2014-09-10 DIAGNOSIS — I25810 Atherosclerosis of coronary artery bypass graft(s) without angina pectoris: Secondary | ICD-10-CM

## 2014-09-10 LAB — HEPATIC FUNCTION PANEL
ALT: 9 U/L (ref 0–53)
AST: 22 U/L (ref 0–37)
Albumin: 4.6 g/dL (ref 3.5–5.2)
Alkaline Phosphatase: 39 U/L (ref 39–117)
Bilirubin, Direct: 0.1 mg/dL (ref 0.0–0.3)
Total Bilirubin: 0.4 mg/dL (ref 0.2–1.2)
Total Protein: 7.9 g/dL (ref 6.0–8.3)

## 2014-09-10 LAB — TSH: TSH: 4.43 u[IU]/mL (ref 0.35–4.50)

## 2014-09-10 MED ORDER — ASPIRIN 81 MG PO TABS: 81.0000 mg | ORAL_TABLET | Freq: Every day | ORAL | Status: DC

## 2014-09-10 NOTE — Patient Instructions (Signed)
Your physician has recommended you make the following change in your medication:  1) REDUCE Aspirin 81mg  daily  Lab Today: Tsh, Hepatic  Your physician wants you to follow-up in: 6 months with Dr.Smith You will receive a reminder letter in the mail two months in advance. If you don't receive a letter, please call our office to schedule the follow-up appointment.

## 2014-09-10 NOTE — Progress Notes (Signed)
Cardiology Office Note   Date:  09/10/2014   ID:  [name] Marble Sr., DOB [birth year], MRN [id number]  PCP:  [name], MD  Cardiologist:   Sinclair Grooms, MD   Chief Complaint  Patient presents with  . Shortness of Breath      History of Present Illness: [name] Yuhas. is a 79 y.o. male who presents for follow-up of paroxysmal atrial fibrillation, chronic amiodarone therapy, CAD with recent CABG, and sick sinus syndrome. He has occasional dyspnea on become [place]. He denies edema. He has not had syncope or angina. Overall he feels well.    Past Medical History  Diagnosis Date  . Hypertension   . Hyperlipidemia   . Ulcer   . GERD (gastroesophageal reflux disease)   . Hemorrhoids   . Intestinal ischemia   . DVT (deep venous thrombosis) 2011    Right arm  . Atrial fibrillation   . Thrombocytopenia, unspecified 04/24/2013  . Gout   . Low back pain   . Depressive disorder   . Internal hemorrhoids without mention of complication   . Gastroparesis   . Chronic vascular insufficiency of intestine   . Osteopenia   . Ischemic colitis   . Chronic blood loss anemia   . Peripheral artery disease   . Diabetes mellitus type 2 with peripheral artery disease   . Chronic diastolic heart failure   . Liddle's syndrome   . Benign neoplasm of kidney, except pelvis   . Diabetes mellitus with kidney disease   . Renal artery stenosis   . CKD (chronic kidney disease)   . Vitamin B 12 deficiency   . CAD (coronary artery disease)     s/b CABG 1994, and subsequent stents. Repeat CABG 12/2011,  . Prostate cancer 1997    XRT and lupron  . Myelodysplastic syndrome 05/22/2013    With low hemoglobin and platelets treated with Procrit  . Angiomyolipoma 2009    On both kidneys noted in 2009  . Sick sinus syndrome   . B12 deficiency 08/31/2013    Past Surgical History  Procedure Laterality Date  . Hiatal hernia repair      and ulcer repair  . Appendectomy  1991  .  Pacemaker insertion  10/18/1994    DDD pacemaker, [employer]. Gen change 12/10/2003.  Marland Kitchen Coronary artery bypass graft  01/22/1993  . Cholecystectomy  Oct 2009    Laparoscopic  . Coronary artery bypass graft  01/03/2012    Procedure: REDO CORONARY ARTERY BYPASS GRAFTING (CABG);  Surgeon: Gaye Pollack, MD;  Location: [place];  Service: Open Heart Surgery;  Laterality: N/A;  Redo CABG x  using bilateral internal mammary arteries;  left leg greater saphenous vein harvested endoscopically  . Celiac artery angioplasty  05-16-12    and stenting  . Partial gastrectomy  1969    Hx of ulcer s/p partial gastrectomy/ has pernicious anemia  . Other surgical history  02/13/13    superior mesenteric artery angiogram  . Thrombectomy / embolectomy subclavian artery  02/02/10    Right subclavian thromboectomy and venous angioplasty, and chronic mesenteric ischemia with Herculink stenting to superior mesenteric and celiac arteries - Dr. Trula Slade  . Other surgical history  05/16/12    Stent in stomach  . Pacemaker generator change  12/10/2003    SJM Identity XL DR performed by Dr Leonia Reeves  . Visceral angiogram N/A 05/25/2011    Procedure: VISCERAL ANGIOGRAM;  Surgeon: Serafina Mitchell, MD;  Location:  [facility] CATH LAB;  Service: Cardiovascular;  Laterality: N/A;  . Abdominal angiogram N/A 05/25/2011    Procedure: ABDOMINAL ANGIOGRAM;  Surgeon: Serafina Mitchell, MD;  Location: [facility] CATH LAB;  Service: Cardiovascular;  Laterality: N/A;  . Lower extremity angiogram Bilateral 05/25/2011    Procedure: LOWER EXTREMITY ANGIOGRAM;  Surgeon: Serafina Mitchell, MD;  Location: [facility] CATH LAB;  Service: Cardiovascular;  Laterality: Bilateral;  . Left heart catheterization with coronary/graft angiogram  12/24/2011    Procedure: LEFT HEART CATHETERIZATION WITH Beatrix Fetters;  Surgeon: Sinclair Grooms, MD;  Location: [facility] CATH LAB;  Service: Cardiovascular;;  . Visceral angiogram Bilateral 12/28/2011    Procedure: VISCERAL ANGIOGRAM;  Surgeon:  Serafina Mitchell, MD;  Location: [facility] CATH LAB;  Service: Cardiovascular;  Laterality: Bilateral;  . Visceral angiogram N/A 05/16/2012    Procedure: VISCERAL ANGIOGRAM;  Surgeon: Serafina Mitchell, MD;  Location: [facility] CATH LAB;  Service: Cardiovascular;  Laterality: N/A;  . Visceral angiogram N/A 02/13/2013    Procedure: VISCERAL ANGIOGRAM;  Surgeon: Serafina Mitchell, MD;  Location: [facility] CATH LAB;  Service: Cardiovascular;  Laterality: N/A;  . Renal angiogram N/A 02/13/2013    Procedure: RENAL ANGIOGRAM;  Surgeon: Serafina Mitchell, MD;  Location[facility] LAB;  Service: Cardiovascular;  Laterality: N/A;  . Abdominal angiogram N/A 02/13/2013    Procedure: ABDOMINAL ANGIOGRAM;  Surgeon: Serafina Mitchell, MD;  Location: [facility] CATH LAB;  Service: Cardiovascular;  Laterality: N/A;     Current Outpatient Prescriptions  Medication Sig Dispense Refill  . acetaminophen (ARTHRITIS PAIN RELIEF) 650 MG CR tablet Take 1,300 mg by mouth 2 (two) times daily.     Marland Kitchen amiodarone (PACERONE) 200 MG tablet Take 0.5 tablets (100 mg total) by mouth daily. 45 tablet 1  . amLODipine (NORVASC) 10 MG tablet Take 10 mg by mouth daily.    Marland Kitchen aspirin 81 MG tablet Take 1 tablet (81 mg total) by mouth daily.    Marland Kitchen atorvastatin (LIPITOR) 80 MG tablet Take 80 mg by mouth daily.    Marland Kitchen CALCIUM PO Take 2,000 mg by mouth daily.     . carvedilol (COREG) 25 MG tablet TAKE 1 TABLET TWICE DAILY WITH A MEAL 180 tablet 1  . cholecalciferol (VITAMIN D) 1000 UNITS tablet Take 1,000 Units by mouth daily.     . cloNIDine (CATAPRES) 0.2 MG tablet Take 0.2 mg by mouth 2 (two) times daily.     . clopidogrel (PLAVIX) 75 MG tablet Take 1 tablet (75 mg total) by mouth daily. 90 tablet 1  . furosemide (LASIX) 80 MG tablet TAKE 1 AND 1/2 TABLETS TWICE DAILY 180 tablet 1  . hydrALAZINE (APRESOLINE) 50 MG tablet Take 1 tablet (50 mg total) by mouth 3 (three) times daily. 90 tablet 3  . isosorbide mononitrate (IMDUR) 120 MG 24 hr tablet Take 1 tablet (120 mg total) by mouth  daily. 90 tablet 1  . pantoprazole (PROTONIX) 40 MG tablet Take 40 mg by mouth daily.    . potassium chloride SA (K-DUR,KLOR-CON) 20 MEQ tablet Take 20 mEq by mouth 2 (two) times daily. Take 39meq in the am and pm and 8meq at lunch    . silodosin (RAPAFLO) 8 MG CAPS capsule Take 8 mg by mouth daily.      No current facility-administered medications for this visit.    Allergies:   Nsaids; Ibuprofen; and Ace inhibitors    Social History:  The patient  reports that he quit smoking about 45 years ago. His  smoking use included Cigarettes. He quit after 20 years of use. He has never used smokeless tobacco. He reports that he does not drink alcohol or use illicit drugs.   Family History:  The patient's family history includes CAD in his brother; CVA (age of onset: 46) in his mother; Cancer in his father, paternal uncle, and sister; Diabetes in his mother and sister; Heart attack in his daughter; Heart disease in his brother, daughter, mother, and sister; Hyperlipidemia in his mother; Hypertension in his daughter, father, mother, and sister.    ROS:  Please see the history of present illness.   Otherwise, review of systems are positive for hematuria for 2 days. Nesi  All other systems are reviewed and negative.    PHYSICAL EXAM: VS:  BP 134/62 mmHg  Pulse 59  Ht 5\' 10"  (1.778 m)  Wt 159 lb 6.4 oz (72.303 kg)  BMI 22.87 kg/m2 , BMI Body mass index is 22.87 kg/(m^2). GEN: Well nourished, well developed, in no acute distress HEENT: normal Neck: no JVD, carotid bruits, or masses Cardiac: RRR; no murmurs, rubs, or gallops,no edema  Respiratory:  clear to auscultation bilaterally, normal work of breathing GI: soft, nontender, nondistended, + BS MS: no deformity or atrophy Skin: warm and dry, no rash Neuro:  Strength and sensation are intact Psych: euthymic mood, full affect   EKG:  EKG is ordered today. The ekg ordered today demonstrates sinus bradycardia at 59 bpm, nonspecific ST  abnormality V1 through V3 and nonspecific ST depression, felt to be medication metabolic related.   Recent Labs: 03/11/2014: TSH 3.10 04/03/2014: ALT 10; BUN 35*; Creatinine 2.52*; Potassium 3.3*; Sodium 142 09/09/2014: Hemoglobin 9.3*; Platelets 94*    Lipid Panel    Component Value Date/Time   CHOL 104 12/25/2011 0416   TRIG 41 12/25/2011 0416   HDL 56 12/25/2011 0416   CHOLHDL 1.9 12/25/2011 0416   VLDL 8 12/25/2011 0416   LDLCALC 40 12/25/2011 0416      Wt Readings from Last 3 Encounters:  09/10/14 159 lb 6.4 oz (72.303 kg)  05/27/14 164 lb 6.4 oz (74.571 kg)  03/11/14 166 lb (75.297 kg)      Other studies Reviewed: Additional studies/ records that were reviewed today include: .    ASSESSMENT AND PLAN:  1.  Chronic amiodarone therapy without obvious toxicity 2. Paroxysmal atrial fibrillation suppressed/controlled with amiodarone therapy 3. Coronary artery bypass grafting 1996 and redo in 2015, asymptomatic and stable on medical therapy 5. Sick sinus syndrome with EDD pacemaker present. Patient is stable 4. Chronic diastolic heart failure, stable and no evidence of volume overload 6. Essential hypertension under excellent control   Current medicines are reviewed at length with the patient today.  The patient does not have concerns regarding medicines.  The following changes have been made:  no change  Labs/ tests ordered today include:   Orders Placed This Encounter  Procedures  . TSH  . Hepatic function panel  . EKG 12-Lead     Disposition:   FU with [name] in 6 months   Signed, Sinclair Grooms, MD  09/10/2014 1:13 PM    [facility] [address], [place],   [address] Phone: [phone]; Fax: [phone]

## 2014-09-13 ENCOUNTER — Telehealth: Payer: Self-pay

## 2014-09-13 NOTE — Telephone Encounter (Signed)
-----   Message from Sinclair Grooms, MD sent at 09/10/2014  5:54 PM EST ----- Thyroid and liver are okay

## 2014-09-13 NOTE — Telephone Encounter (Signed)
Pt aware of lab results.Thyroid and liver are okay.pt verbalized understanding.

## 2014-09-30 ENCOUNTER — Ambulatory Visit (HOSPITAL_BASED_OUTPATIENT_CLINIC_OR_DEPARTMENT_OTHER): Payer: Medicare Other

## 2014-09-30 ENCOUNTER — Other Ambulatory Visit (HOSPITAL_BASED_OUTPATIENT_CLINIC_OR_DEPARTMENT_OTHER): Payer: Medicare Other

## 2014-09-30 DIAGNOSIS — D469 Myelodysplastic syndrome, unspecified: Secondary | ICD-10-CM

## 2014-09-30 LAB — CBC WITH DIFFERENTIAL/PLATELET
BASO%: 0.7 % (ref 0.0–2.0)
Basophils Absolute: 0 10*3/uL (ref 0.0–0.1)
EOS%: 5.6 % (ref 0.0–7.0)
Eosinophils Absolute: 0.3 10*3/uL (ref 0.0–0.5)
HCT: 29 % — ABNORMAL LOW (ref 38.4–49.9)
HGB: 9.6 g/dL — ABNORMAL LOW (ref 13.0–17.1)
LYMPH%: 23.7 % (ref 14.0–49.0)
MCH: 33.9 pg — AB (ref 27.2–33.4)
MCHC: 33.1 g/dL (ref 32.0–36.0)
MCV: 102.5 fL — ABNORMAL HIGH (ref 79.3–98.0)
MONO#: 0.4 10*3/uL (ref 0.1–0.9)
MONO%: 9.4 % (ref 0.0–14.0)
NEUT%: 60.6 % (ref 39.0–75.0)
NEUTROS ABS: 2.7 10*3/uL (ref 1.5–6.5)
Platelets: 99 10*3/uL — ABNORMAL LOW (ref 140–400)
RBC: 2.83 10*6/uL — AB (ref 4.20–5.82)
RDW: 15.7 % — AB (ref 11.0–14.6)
WBC: 4.5 10*3/uL (ref 4.0–10.3)
lymph#: 1.1 10*3/uL (ref 0.9–3.3)

## 2014-09-30 MED ORDER — EPOETIN ALFA 20000 UNIT/ML IJ SOLN
20000.0000 [IU] | INTRAMUSCULAR | Status: DC
  Administered 2014-09-30: 20000 [IU] via SUBCUTANEOUS
  Filled 2014-09-30: qty 1

## 2014-10-12 ENCOUNTER — Encounter: Payer: Self-pay | Admitting: Nurse Practitioner

## 2014-10-12 NOTE — Progress Notes (Signed)
This encounter was created in error - please disregard.

## 2014-10-14 ENCOUNTER — Encounter: Payer: Medicare Other | Admitting: Nurse Practitioner

## 2014-10-16 ENCOUNTER — Other Ambulatory Visit: Payer: Self-pay

## 2014-10-17 ENCOUNTER — Encounter: Payer: Self-pay | Admitting: Nurse Practitioner

## 2014-10-17 ENCOUNTER — Ambulatory Visit (INDEPENDENT_AMBULATORY_CARE_PROVIDER_SITE_OTHER): Payer: Medicare Other | Admitting: Nurse Practitioner

## 2014-10-17 ENCOUNTER — Other Ambulatory Visit: Payer: Self-pay | Admitting: Hematology and Oncology

## 2014-10-17 VITALS — BP 110/58 | HR 60 | Ht 70.0 in | Wt 162.2 lb

## 2014-10-17 DIAGNOSIS — I495 Sick sinus syndrome: Secondary | ICD-10-CM

## 2014-10-17 DIAGNOSIS — I480 Paroxysmal atrial fibrillation: Secondary | ICD-10-CM

## 2014-10-17 DIAGNOSIS — D469 Myelodysplastic syndrome, unspecified: Secondary | ICD-10-CM

## 2014-10-17 LAB — MDC_IDC_ENUM_SESS_TYPE_INCLINIC
Battery Voltage: 2.75 V
Brady Statistic RA Percent Paced: 53 %
Implantable Pulse Generator Model: 5376
Implantable Pulse Generator Serial Number: 971105
Lead Channel Impedance Value: 339 Ohm
Lead Channel Impedance Value: 400 Ohm
Lead Channel Setting Sensing Sensitivity: 1.5 mV
MDC IDC MSMT LEADCHNL RA PACING THRESHOLD AMPLITUDE: 0.5 V
MDC IDC MSMT LEADCHNL RA PACING THRESHOLD PULSEWIDTH: 0.5 ms
MDC IDC MSMT LEADCHNL RA SENSING INTR AMPL: 1.6 mV
MDC IDC MSMT LEADCHNL RV PACING THRESHOLD AMPLITUDE: 1.62 V
MDC IDC MSMT LEADCHNL RV PACING THRESHOLD PULSEWIDTH: 0.5 ms
MDC IDC MSMT LEADCHNL RV SENSING INTR AMPL: 6.2 mV
MDC IDC SET LEADCHNL RA PACING AMPLITUDE: 2 V
MDC IDC SET LEADCHNL RV PACING PULSEWIDTH: 0.5 ms
MDC IDC STAT BRADY RV PERCENT PACED: 1.1 %

## 2014-10-17 NOTE — Patient Instructions (Addendum)
Your physician wants you to follow-up in: 6 months with device clinic on day he sees Dr Tamala Julian and 12 months with Dr Vallery Ridge will receive a reminder letter in the mail two months in advance. If you don't receive a letter, please call our office to schedule the follow-up appointment.

## 2014-10-17 NOTE — Progress Notes (Signed)
Electrophysiology Office Note Date: 10/17/2014  ID:  [name] Marble Sr., DOB 02/19/1936, MRN [id number]  PCP: [name], MD Primary Cardiologist: Tamala Julian Electrophysiologist: Allred  CC: Pacemaker follow-up  [name] Brow. is a 79 y.o. male is seen today for Dr Rayann Heman.  He presents today for routine electrophysiology followup.  Since last being seen in our clinic, the patient reports doing very well.  He denies chest pain, palpitations, nausea, vomiting, dizziness, syncope.  He has occasional LE edema and shortness of breath on exertion when eating increased salt.   Device History: STJ dual chamber PPM implanted 1996 with generator change 2005 by Dr Glade Lloyd for sick sinus syndrome   Past Medical History  Diagnosis Date  . Hypertension   . Hyperlipidemia   . Ulcer   . GERD (gastroesophageal reflux disease)   . DVT (deep venous thrombosis) 2011    Right arm  . Atrial fibrillation   . Thrombocytopenia, unspecified 04/24/2013  . Gout   . Depressive disorder   . Internal hemorrhoids without mention of complication   . Gastroparesis   . Osteopenia   . Ischemic colitis   . Peripheral artery disease   . Diabetes mellitus type 2 with peripheral artery disease   . Chronic diastolic heart failure   . Liddle's syndrome   . Renal artery stenosis   . CKD (chronic kidney disease)   . Vitamin B 12 deficiency   . CAD (coronary artery disease)     s/b CABG 1994, and subsequent stents. Repeat CABG 12/2011,  . Prostate cancer 1997    XRT and lupron  . Myelodysplastic syndrome 05/22/2013    With low hemoglobin and platelets treated with Procrit  . Angiomyolipoma 2009    On both kidneys noted in 2009  . Sick sinus syndrome    Past Surgical History  Procedure Laterality Date  . Hiatal hernia repair      and ulcer repair  . Appendectomy  1991  . Pacemaker insertion  10/18/1994    DDD pacemaker, [employer]. Gen change 12/10/2003.  Marland Kitchen Coronary artery bypass graft  01/22/1993    . Cholecystectomy  Oct 2009    Laparoscopic  . Coronary artery bypass graft  01/03/2012    Procedure: REDO CORONARY ARTERY BYPASS GRAFTING (CABG);  Surgeon: Gaye Pollack, MD;  Location: [place];  Service: Open Heart Surgery;  Laterality: N/A;  Redo CABG x  using bilateral internal mammary arteries;  left leg greater saphenous vein harvested endoscopically  . Celiac artery angioplasty  05-16-12    and stenting  . Partial gastrectomy  1969    Hx of ulcer s/p partial gastrectomy/ has pernicious anemia  . Other surgical history  02/13/13    superior mesenteric artery angiogram  . Thrombectomy / embolectomy subclavian artery  02/02/10    Right subclavian thromboectomy and venous angioplasty, and chronic mesenteric ischemia with Herculink stenting to superior mesenteric and celiac arteries - Dr. Trula Slade  . Other surgical history  05/16/12    Stent in stomach  . Pacemaker generator change  12/10/2003    SJM Identity XL DR performed by Dr Leonia Reeves  . Visceral angiogram N/A 05/25/2011    Procedure: VISCERAL ANGIOGRAM;  Surgeon: Serafina Mitchell, MD;  Location[facility] C[facility] LAB;  Service: Cardiovascular;  Laterality: N/A;  . Abdominal angiogram N/A 05/25/2011    Procedure: ABDOMINAL ANGIOGRAM;  Surgeon: Serafina Mitchell, MD;  Location: [facility] CATH LAB;  Service: Cardiovascular;  Laterality: N/A;  . Lower extremity angiogram  Bilateral 05/25/2011    Procedure: LOWER EXTREMITY ANGIOGRAM;  Surgeon: Serafina Mitchell, MD;  Location: [facility] CATH LAB;  Service: Cardiovascular;  Laterality: Bilateral;  . Left heart catheterization with coronary/graft angiogram  12/24/2011    Procedure: LEFT HEART CATHETERIZATION WITH Beatrix Fetters;  Surgeon: Sinclair Grooms, MD;  Location: [facility] CATH LAB;  Service: Cardiovascular;;  . Visceral angiogram Bilateral 12/28/2011    Procedure: VISCERAL ANGIOGRAM;  Surgeon: Serafina Mitchell, MD;  Location: [facility] CATH LAB;  Service: Cardiovascular;  Laterality: Bilateral;  . Visceral angiogram N/A  05/16/2012    Procedure: VISCERAL ANGIOGRAM;  Surgeon: Serafina Mitchell, MD;  Location: [facility] CATH LAB;  Service: Cardiovascular;  Laterality: N/A;  . Visceral angiogram N/A 02/13/2013    Procedure: VISCERAL ANGIOGRAM;  Surgeon: Serafina Mitchell, MD;  Location: [facility] CATH LAB;  Service: Cardiovascular;  Laterality: N/A;  . Renal angiogram N/A 02/13/2013    Procedure: RENAL ANGIOGRAM;  Surgeon: Serafina Mitchell, MD;  Location: [facility] CATH LAB;  Service: Cardiovascular;  Laterality: N/A;  . Abdominal angiogram N/A 02/13/2013    Procedure: ABDOMINAL ANGIOGRAM;  Surgeon: Serafina Mitchell, MD;  Location: [facility] CATH LAB;  Service: Cardiovascular;  Laterality: N/A;    Current Outpatient Prescriptions  Medication Sig Dispense Refill  . acetaminophen (ARTHRITIS PAIN RELIEF) 650 MG CR tablet Take 1,300 mg by mouth 2 (two) times daily.     Marland Kitchen amiodarone (PACERONE) 200 MG tablet Take 0.5 tablets (100 mg total) by mouth daily. 45 tablet 1  . amLODipine (NORVASC) 10 MG tablet Take 10 mg by mouth daily.    Marland Kitchen aspirin 81 MG tablet Take 1 tablet (81 mg total) by mouth daily.    Marland Kitchen atorvastatin (LIPITOR) 80 MG tablet Take 80 mg by mouth daily.    Marland Kitchen CALCIUM PO Take 2,000 mg by mouth daily.     . carvedilol (COREG) 25 MG tablet TAKE 1 TABLET TWICE DAILY WITH A MEAL (Patient taking differently: TAKE 1 TABLET BY MOUTH TWICE DAILY WITH A MEAL) 180 tablet 1  . cholecalciferol (VITAMIN D) 1000 UNITS tablet Take 1,000 Units by mouth daily.     . cloNIDine (CATAPRES) 0.2 MG tablet Take 0.2 mg by mouth 2 (two) times daily.     . clopidogrel (PLAVIX) 75 MG tablet Take 1 tablet (75 mg total) by mouth daily. 90 tablet 1  . furosemide (LASIX) 80 MG tablet TAKE 1 AND 1/2 TABLETS TWICE DAILY (Patient taking differently: TAKE 1 AND 1/2 TABLETS BY MOUTH TWICE DAILY) 180 tablet 1  . hydrALAZINE (APRESOLINE) 50 MG tablet Take 1 tablet (50 mg total) by mouth 3 (three) times daily. 90 tablet 3  . isosorbide mononitrate (IMDUR) 120 MG 24 hr tablet Take 1  tablet (120 mg total) by mouth daily. 90 tablet 1  . pantoprazole (PROTONIX) 40 MG tablet Take 40 mg by mouth daily.    . potassium chloride SA (K-DUR,KLOR-CON) 20 MEQ tablet Take 20 mEq by mouth 3 (three) times daily.     . silodosin (RAPAFLO) 8 MG CAPS capsule Take 8 mg by mouth daily.      No current facility-administered medications for this visit.    Allergies:   Nsaids; Ibuprofen; and Ace inhibitors   Social History: History   Social History  . Marital Status: Married    Spouse Name: N/A  . Number of Children: N/A  . Years of Education: N/A   Occupational History  . Not on file.   Social History Main Topics  .  Smoking status: Former Smoker -- 20 years    Types: Cigarettes    Quit date: 07/12/1969  . Smokeless tobacco: Never Used  . Alcohol Use: No  . Drug Use: No  . Sexual Activity: Yes   Other Topics Concern  . Not on file   Social History Narrative    Family History: Family History  Problem Relation Age of Onset  . Diabetes Mother   . Heart disease Mother     Heart Disease before age 76  . Hyperlipidemia Mother   . Hypertension Mother   . CVA Mother 28    cause of death  . Cancer Father     stomach/liver  . Hypertension Father     possibly hypertensive  . Cancer Sister     Breast cancer  . Heart disease Daughter     Heart Disease before age 77  . Hypertension Daughter   . Heart attack Daughter   . CAD Brother   . Heart disease Brother   . Cancer Paternal Uncle     colon  . Heart disease Sister   . Diabetes Sister   . Hypertension Sister      Review of Systems: All other systems reviewed and are otherwise negative except as noted above.   Physical Exam: VS:  BP 110/58 mmHg  Pulse 60  Ht 5\' 10"  (1.778 m)  Wt 162 lb 3.2 oz (73.573 kg)  BMI 23.27 kg/m2 , BMI Body mass index is 23.27 kg/(m^2).  GEN- The patient is elderly, kyphotic appearing, alert and oriented x 3 today.   HEENT: normocephalic, atraumatic; sclera clear, conjunctiva  pink; hearing intact; oropharynx clear; neck supple, no JVP Lymph- no cervical lymphadenopathy Lungs- Clear to ausculation bilaterally, normal work of breathing.  No wheezes, rales, rhonchi Heart- Regular rate and rhythm, 2/6 murmur RUSB GI- soft, non-tender, non-distended, bowel sounds present  Extremities- no clubbing, 1+ edema, DP/PT pulses 1+ MS- no significant deformity or atrophy Skin- warm and dry, no rash or lesion; right sided PPM pocket well healed Psych- euthymic mood, full affect Neuro- strength and sensation are intact  PPM Interrogation- reviewed in detail today,  See PACEART report  EKG:  EKG is not ordered today.  Recent Labs: 04/03/2014: BUN 35*; Creatinine 2.52*; Potassium 3.3*; Sodium 142 09/10/2014: ALT 9; TSH 4.43 09/30/2014: Hemoglobin 9.6*; Platelets 99*   Wt Readings from Last 3 Encounters:  10/17/14 162 lb 3.2 oz (73.573 kg)  09/10/14 159 lb 6.4 oz (72.303 kg)  05/27/14 164 lb 6.4 oz (74.571 kg)     Other studies Reviewed: Additional studies/ records that were reviewed today include: Dr Thompson Caul and Dr Jackalyn Lombard office notes  Assessment and Plan:  1.  Sick sinus syndrome Normal PPM function See Pace Art report No changes today  2.  Paroxysmal atrial fibrillation AF burden 0% on device interrogation today Continue Amiodarone - recent surveillance labs done by Dr Tamala Julian Not on [facility] 2/2 concerns about bleeding risks, continue ASA/Plavix per Dr Tamala Julian  3.  Shortness of breath EF normal by echo 05/2013 No chest pain or reproducible symptoms with exertion Pt clearly correlates symptoms with increased salt intake Advised to decrease Na+ intake   Current medicines are reviewed at length with the patient today.   The patient does not have concerns regarding his medicines.  The following changes were made today:  none  Labs/ tests ordered today include: none    Disposition:   Follow up with device clinic 6 months (same day as Dr Tamala Julian),  follow up Dr Rayann Heman  1 year    Signed, Chanetta Marshall, NP 10/17/2014 3:26 PM  [place] [facility] [address] [place] [address] [phone] (office) [phone] (fax)

## 2014-10-21 ENCOUNTER — Other Ambulatory Visit (HOSPITAL_BASED_OUTPATIENT_CLINIC_OR_DEPARTMENT_OTHER): Payer: Medicare Other

## 2014-10-21 ENCOUNTER — Ambulatory Visit (HOSPITAL_BASED_OUTPATIENT_CLINIC_OR_DEPARTMENT_OTHER): Payer: Medicare Other

## 2014-10-21 DIAGNOSIS — D469 Myelodysplastic syndrome, unspecified: Secondary | ICD-10-CM

## 2014-10-21 DIAGNOSIS — D696 Thrombocytopenia, unspecified: Secondary | ICD-10-CM | POA: Diagnosis not present

## 2014-10-21 DIAGNOSIS — D72819 Decreased white blood cell count, unspecified: Secondary | ICD-10-CM

## 2014-10-21 LAB — CBC & DIFF AND RETIC
BASO%: 1 % (ref 0.0–2.0)
Basophils Absolute: 0 10*3/uL (ref 0.0–0.1)
EOS%: 6.6 % (ref 0.0–7.0)
Eosinophils Absolute: 0.3 10*3/uL (ref 0.0–0.5)
HCT: 29 % — ABNORMAL LOW (ref 38.4–49.9)
HGB: 9.3 g/dL — ABNORMAL LOW (ref 13.0–17.1)
Immature Retic Fract: 3.9 % (ref 3.00–10.60)
LYMPH%: 30.2 % (ref 14.0–49.0)
MCH: 33.2 pg (ref 27.2–33.4)
MCHC: 32.1 g/dL (ref 32.0–36.0)
MCV: 103.6 fL — ABNORMAL HIGH (ref 79.3–98.0)
MONO#: 0.6 10*3/uL (ref 0.1–0.9)
MONO%: 13.7 % (ref 0.0–14.0)
NEUT#: 2 10*3/uL (ref 1.5–6.5)
NEUT%: 48.5 % (ref 39.0–75.0)
Platelets: 106 10*3/uL — ABNORMAL LOW (ref 140–400)
RBC: 2.8 10*6/uL — AB (ref 4.20–5.82)
RDW: 15.8 % — AB (ref 11.0–14.6)
RETIC CT ABS: 19.04 10*3/uL — AB (ref 34.80–93.90)
Retic %: 0.68 % — ABNORMAL LOW (ref 0.80–1.80)
WBC: 4.1 10*3/uL (ref 4.0–10.3)
lymph#: 1.2 10*3/uL (ref 0.9–3.3)

## 2014-10-21 MED ORDER — EPOETIN ALFA 20000 UNIT/ML IJ SOLN
20000.0000 [IU] | INTRAMUSCULAR | Status: DC
  Administered 2014-10-21: 20000 [IU] via SUBCUTANEOUS
  Filled 2014-10-21: qty 1

## 2014-11-04 ENCOUNTER — Other Ambulatory Visit: Payer: Self-pay | Admitting: Internal Medicine

## 2014-11-11 ENCOUNTER — Telehealth: Payer: Self-pay | Admitting: Hematology and Oncology

## 2014-11-11 ENCOUNTER — Other Ambulatory Visit (HOSPITAL_BASED_OUTPATIENT_CLINIC_OR_DEPARTMENT_OTHER): Payer: Medicare Other

## 2014-11-11 ENCOUNTER — Ambulatory Visit (HOSPITAL_BASED_OUTPATIENT_CLINIC_OR_DEPARTMENT_OTHER): Payer: Medicare Other | Admitting: Hematology and Oncology

## 2014-11-11 ENCOUNTER — Encounter: Payer: Self-pay | Admitting: Hematology and Oncology

## 2014-11-11 ENCOUNTER — Ambulatory Visit (HOSPITAL_BASED_OUTPATIENT_CLINIC_OR_DEPARTMENT_OTHER): Payer: Medicare Other

## 2014-11-11 VITALS — BP 123/51 | HR 59 | Temp 98.2°F | Resp 16 | Ht 70.0 in | Wt 159.8 lb

## 2014-11-11 DIAGNOSIS — D72819 Decreased white blood cell count, unspecified: Secondary | ICD-10-CM

## 2014-11-11 DIAGNOSIS — D696 Thrombocytopenia, unspecified: Secondary | ICD-10-CM | POA: Diagnosis not present

## 2014-11-11 DIAGNOSIS — D469 Myelodysplastic syndrome, unspecified: Secondary | ICD-10-CM

## 2014-11-11 DIAGNOSIS — E538 Deficiency of other specified B group vitamins: Secondary | ICD-10-CM

## 2014-11-11 DIAGNOSIS — I10 Essential (primary) hypertension: Secondary | ICD-10-CM

## 2014-11-11 LAB — CBC & DIFF AND RETIC
BASO%: 0.5 % (ref 0.0–2.0)
Basophils Absolute: 0 10*3/uL (ref 0.0–0.1)
EOS%: 6.4 % (ref 0.0–7.0)
Eosinophils Absolute: 0.3 10*3/uL (ref 0.0–0.5)
HEMATOCRIT: 29.4 % — AB (ref 38.4–49.9)
HGB: 9.4 g/dL — ABNORMAL LOW (ref 13.0–17.1)
Immature Retic Fract: 0.9 % — ABNORMAL LOW (ref 3.00–10.60)
LYMPH#: 1.1 10*3/uL (ref 0.9–3.3)
LYMPH%: 29 % (ref 14.0–49.0)
MCH: 33.7 pg — ABNORMAL HIGH (ref 27.2–33.4)
MCHC: 32 g/dL (ref 32.0–36.0)
MCV: 105.4 fL — AB (ref 79.3–98.0)
MONO#: 0.4 10*3/uL (ref 0.1–0.9)
MONO%: 9.5 % (ref 0.0–14.0)
NEUT#: 2.1 10*3/uL (ref 1.5–6.5)
NEUT%: 54.6 % (ref 39.0–75.0)
Platelets: 95 10*3/uL — ABNORMAL LOW (ref 140–400)
RBC: 2.79 10*6/uL — AB (ref 4.20–5.82)
RDW: 15.3 % — ABNORMAL HIGH (ref 11.0–14.6)
RETIC CT ABS: 11.72 10*3/uL — AB (ref 34.80–93.90)
Retic %: 0.42 % — ABNORMAL LOW (ref 0.80–1.80)
WBC: 3.9 10*3/uL — AB (ref 4.0–10.3)

## 2014-11-11 MED ORDER — EPOETIN ALFA 40000 UNIT/ML IJ SOLN
40000.0000 [IU] | INTRAMUSCULAR | Status: DC
  Administered 2014-11-11: 40000 [IU] via SUBCUTANEOUS
  Filled 2014-11-11: qty 1

## 2014-11-11 NOTE — Patient Instructions (Signed)

## 2014-11-11 NOTE — Telephone Encounter (Signed)
Gave avs & calendar for May thru November.

## 2014-11-11 NOTE — Assessment & Plan Note (Signed)
The patient had history of severe hypertension. His blood pressure is currently under control. He is aware, he would not get his injection if systolic blood pressures greater than 160.

## 2014-11-11 NOTE — Assessment & Plan Note (Signed)
This is due to his underlying bone marrow disorder. He is not symptomatic. Recommend close observation.

## 2014-11-11 NOTE — Assessment & Plan Note (Signed)
The patient has been getting erythropoietin stimulating agents for a while. Previously, with excellent response to treatment, we reduced the frequency of injection to every 4 weeks. However, recently, I have increased the frequency of injection to every 3 weeks due to persistent anemia and he agreed to proceed. Despite this, he is still anemic. I will double up on the dose to 40,000 units every 3 weeks to keep hemoglobin greater than 11. He agreed with the dose modification.

## 2014-11-11 NOTE — Assessment & Plan Note (Signed)
The patient will continue on B 12 replacement therapy. His last vitamin B-12 level were adequate.

## 2014-11-11 NOTE — Assessment & Plan Note (Signed)
This is due to his underlying myelodysplastic syndrome. He is not symptomatic. Continue close observation.

## 2014-11-11 NOTE — Progress Notes (Signed)
[facility] OFFICE PROGRESS NOTE  Patient Care Team: Hulan Fess, MD as PCP - General (Family Medicine) Belva Crome, MD (Cardiology) Laurence Spates, MD (Gastroenterology) Lowella Bandy, MD (Urology) Heath Lark, MD as Consulting Physician (Hematology and Oncology) Chanetta Marshall, NP as Nurse Practitioner (Cardiology)  SUMMARY OF ONCOLOGIC HISTORY:  DIAGNOSIS: Low-grade myelodysplastic syndrome, history prostate cancer, on erythropoietin stimulating agents for chronic anemia  SUMMARY OF ONCOLOGIC HISTORY: This is a very interesting gentleman with a history of prostate cancer diagnosed in 1997, status post radiation and seed implantation and Lupron injection. The patient cannot recall the stage of his disease. He's been getting Lupron injection every 4 months. He does not recall his last PSA number but was never told he require urgent attention of further treatment apart from just getting Lupron injection. He was also found to have chronic anemia in the past and has been placed on oral ion supplements 4 years of which he took twice a day. His last ferritin level was over 1300. The patient also has extensive EGD and colonoscopy done which showed no evidence of active bleeding. He had B12 level checked recently and was found to have B12 deficiency and was started on B12 injections. The patient also has significant anemia requiring hospitalization and recent blood transfusion. On 05/10/2013 he had bone marrow aspirate and biopsy which is consistent with myelodysplastic syndrome. On 06/22/2013: We started him on erythropoietin stimulating agents for anemia.  INTERVAL HISTORY: Please see below for problem oriented charting. The patient denies any recent signs or symptoms of bleeding such as spontaneous epistaxis or hematochezia. He had one episode of hematuria 2 months ago but that has resolved. Denies recent exacerbation of congestive heart failure. Denies recent infection.  REVIEW OF  SYSTEMS:   Constitutional: Denies fevers, chills or abnormal weight loss Eyes: Denies blurriness of vision Ears, nose, mouth, throat, and face: Denies mucositis or sore throat Respiratory: Denies cough, dyspnea or wheezes Cardiovascular: Denies palpitation, chest discomfort or lower extremity swelling Gastrointestinal:  Denies nausea, heartburn or change in bowel habits Skin: Denies abnormal skin rashes Lymphatics: Denies new lymphadenopathy or easy bruising Neurological:Denies numbness, tingling or new weaknesses Behavioral/Psych: Mood is stable, no new changes  All other systems were reviewed with the patient and are negative.  I have reviewed the past medical history, past surgical history, social history and family history with the patient and they are unchanged from previous note.  ALLERGIES:  is allergic to nsaids; ibuprofen; and ace inhibitors.  MEDICATIONS:  Current Outpatient Prescriptions  Medication Sig Dispense Refill  . acetaminophen (ARTHRITIS PAIN RELIEF) 650 MG CR tablet Take 1,300 mg by mouth 2 (two) times daily.     Marland Kitchen amiodarone (PACERONE) 200 MG tablet Take 0.5 tablets (100 mg total) by mouth daily. 45 tablet 1  . amLODipine (NORVASC) 10 MG tablet Take 10 mg by mouth daily.    Marland Kitchen aspirin 81 MG tablet Take 1 tablet (81 mg total) by mouth daily.    Marland Kitchen atorvastatin (LIPITOR) 80 MG tablet Take 80 mg by mouth daily.    Marland Kitchen CALCIUM PO Take 2,000 mg by mouth daily.     . carvedilol (COREG) 25 MG tablet TAKE 1 TABLET TWICE DAILY WITH A MEAL (Patient taking differently: TAKE 1 TABLET BY MOUTH TWICE DAILY WITH A MEAL) 180 tablet 1  . cholecalciferol (VITAMIN D) 1000 UNITS tablet Take 1,000 Units by mouth daily.     . cloNIDine (CATAPRES) 0.2 MG tablet Take 0.2 mg by mouth 2 (  two) times daily.     . clopidogrel (PLAVIX) 75 MG tablet Take 1 tablet (75 mg total) by mouth daily. 90 tablet 1  . furosemide (LASIX) 80 MG tablet TAKE 1 AND 1/2 TABLETS TWICE DAILY (Patient taking  differently: TAKE 1 AND 1/2 TABLETS BY MOUTH TWICE DAILY) 180 tablet 1  . hydrALAZINE (APRESOLINE) 50 MG tablet Take 1 tablet (50 mg total) by mouth 3 (three) times daily. 90 tablet 3  . isosorbide mononitrate (IMDUR) 120 MG 24 hr tablet Take 1 tablet (120 mg total) by mouth daily. 90 tablet 1  . pantoprazole (PROTONIX) 40 MG tablet Take 40 mg by mouth daily.    . potassium chloride SA (K-DUR,KLOR-CON) 20 MEQ tablet Take 20 mEq by mouth 4 (four) times daily.     . silodosin (RAPAFLO) 8 MG CAPS capsule Take 8 mg by mouth daily.      No current facility-administered medications for this visit.    PHYSICAL EXAMINATION: ECOG PERFORMANCE STATUS: 1 - Symptomatic but completely ambulatory  Filed Vitals:   11/11/14 1116  BP: 123/51  Pulse: 59  Temp: 98.2 F (36.8 C)  Resp: 16   Filed Weights   11/11/14 1116  Weight: 159 lb 12.8 oz (72.485 kg)    GENERAL:alert, no distress and comfortable SKIN: skin color, texture, turgor are normal, no rashes or significant lesions EYES: normal, Conjunctiva are pink and non-injected, sclera clear OROPHARYNX:no exudate, no erythema and lips, buccal mucosa, and tongue normal  NECK: supple, thyroid normal size, non-tender, without nodularity LYMPH:  no palpable lymphadenopathy in the cervical, axillary or inguinal LUNGS: clear to auscultation and percussion with normal breathing effort HEART: regular rate & rhythm and no murmurs and no lower extremity edema ABDOMEN:abdomen soft, non-tender and normal bowel sounds Musculoskeletal:no cyanosis of digits and no clubbing  NEURO: alert & oriented x 3 with fluent speech, no focal motor/sensory deficits  LABORATORY DATA:  I have reviewed the data as listed    Component Value Date/Time   NA 142 04/03/2014 1830   NA 146* 08/10/2013 1324   K 3.3* 04/03/2014 1830   K 3.9 08/10/2013 1324   CL 102 04/03/2014 1830   CO2 24 04/03/2014 1830   CO2 27 08/10/2013 1324   GLUCOSE 135* 04/03/2014 1830   GLUCOSE 105  08/10/2013 1324   BUN 35* 04/03/2014 1830   BUN 40.3* 08/10/2013 1324   CREATININE 2.52* 04/03/2014 1830   CREATININE 2.6* 08/10/2013 1324   CALCIUM 9.6 04/03/2014 1830   CALCIUM 9.4 08/10/2013 1324   PROT 7.9 09/10/2014 1201   PROT 7.0 08/10/2013 1324   ALBUMIN 4.6 09/10/2014 1201   ALBUMIN 3.8 08/10/2013 1324   AST 22 09/10/2014 1201   AST 14 08/10/2013 1324   ALT 9 09/10/2014 1201   ALT 8 08/10/2013 1324   ALKPHOS 39 09/10/2014 1201   ALKPHOS 47 08/10/2013 1324   BILITOT 0.4 09/10/2014 1201   BILITOT 0.33 08/10/2013 1324   GFRNONAA 23* 04/03/2014 1830   GFRAA 27* 04/03/2014 1830    No results found for: SPEP, UPEP  Lab Results  Component Value Date   WBC 3.9* 11/11/2014   NEUTROABS 2.1 11/11/2014   HGB 9.4* 11/11/2014   HCT 29.4* 11/11/2014   MCV 105.4* 11/11/2014   PLT 95* 11/11/2014      Chemistry      Component Value Date/Time   NA 142 04/03/2014 1830   NA 146* 08/10/2013 1324   K 3.3* 04/03/2014 1830   K 3.9 08/10/2013 1324  CL 102 04/03/2014 1830   CO2 24 04/03/2014 1830   CO2 27 08/10/2013 1324   BUN 35* 04/03/2014 1830   BUN 40.3* 08/10/2013 1324   CREATININE 2.52* 04/03/2014 1830   CREATININE 2.6* 08/10/2013 1324      Component Value Date/Time   CALCIUM 9.6 04/03/2014 1830   CALCIUM 9.4 08/10/2013 1324   ALKPHOS 39 09/10/2014 1201   ALKPHOS 47 08/10/2013 1324   AST 22 09/10/2014 1201   AST 14 08/10/2013 1324   ALT 9 09/10/2014 1201   ALT 8 08/10/2013 1324   BILITOT 0.4 09/10/2014 1201   BILITOT 0.33 08/10/2013 1324      ASSESSMENT & PLAN:  MDS (myelodysplastic syndrome) The patient has been getting erythropoietin stimulating agents for a while. Previously, with excellent response to treatment, we reduced the frequency of injection to every 4 weeks. However, recently, I have increased the frequency of injection to every 3 weeks due to persistent anemia and he agreed to proceed. Despite this, he is still anemic. I will double up on the  dose to 40,000 units every 3 weeks to keep hemoglobin greater than 11. He agreed with the dose modification.   B12 deficiency The patient will continue on B 12 replacement therapy. His last vitamin B-12 level were adequate.   Thrombocytopenia This is due to his underlying bone marrow disorder. He is not symptomatic. Recommend close observation.   Leukopenia This is due to his underlying myelodysplastic syndrome. He is not symptomatic. Continue close observation.   Essential hypertension The patient had history of severe hypertension. His blood pressure is currently under control. He is aware, he would not get his injection if systolic blood pressures greater than 160.    No orders of the defined types were placed in this encounter.   All questions were answered. The patient knows to call the clinic with any problems, questions or concerns. No barriers to learning was detected. I spent 25 minutes counseling the patient face to face. The total time spent in the appointment was 30 minutes and more than 50% was on counseling and review of test results     [facility], Alletta Mattos, MD 11/11/2014 4:53 PM

## 2014-11-13 ENCOUNTER — Encounter: Payer: Self-pay | Admitting: Internal Medicine

## 2014-11-30 ENCOUNTER — Other Ambulatory Visit: Payer: Self-pay | Admitting: Interventional Cardiology

## 2014-12-02 ENCOUNTER — Other Ambulatory Visit (HOSPITAL_BASED_OUTPATIENT_CLINIC_OR_DEPARTMENT_OTHER): Payer: Medicare Other

## 2014-12-02 ENCOUNTER — Ambulatory Visit (HOSPITAL_BASED_OUTPATIENT_CLINIC_OR_DEPARTMENT_OTHER): Payer: Medicare Other

## 2014-12-02 VITALS — BP 140/62 | HR 59 | Temp 97.8°F | Resp 16

## 2014-12-02 DIAGNOSIS — E538 Deficiency of other specified B group vitamins: Secondary | ICD-10-CM

## 2014-12-02 DIAGNOSIS — D72819 Decreased white blood cell count, unspecified: Secondary | ICD-10-CM | POA: Diagnosis not present

## 2014-12-02 DIAGNOSIS — D696 Thrombocytopenia, unspecified: Secondary | ICD-10-CM

## 2014-12-02 DIAGNOSIS — D469 Myelodysplastic syndrome, unspecified: Secondary | ICD-10-CM

## 2014-12-02 LAB — CBC & DIFF AND RETIC
BASO%: 1.1 % (ref 0.0–2.0)
Basophils Absolute: 0 10*3/uL (ref 0.0–0.1)
EOS%: 8.6 % — ABNORMAL HIGH (ref 0.0–7.0)
Eosinophils Absolute: 0.3 10*3/uL (ref 0.0–0.5)
HCT: 31.2 % — ABNORMAL LOW (ref 38.4–49.9)
HGB: 9.9 g/dL — ABNORMAL LOW (ref 13.0–17.1)
IMMATURE RETIC FRACT: 2.6 % — AB (ref 3.00–10.60)
LYMPH%: 34 % (ref 14.0–49.0)
MCH: 33.2 pg (ref 27.2–33.4)
MCHC: 31.7 g/dL — ABNORMAL LOW (ref 32.0–36.0)
MCV: 104.7 fL — ABNORMAL HIGH (ref 79.3–98.0)
MONO#: 0.5 10*3/uL (ref 0.1–0.9)
MONO%: 13 % (ref 0.0–14.0)
NEUT%: 43.3 % (ref 39.0–75.0)
NEUTROS ABS: 1.6 10*3/uL (ref 1.5–6.5)
PLATELETS: 99 10*3/uL — AB (ref 140–400)
RBC: 2.98 10*6/uL — ABNORMAL LOW (ref 4.20–5.82)
RDW: 14.9 % — ABNORMAL HIGH (ref 11.0–14.6)
Retic %: 0.44 % — ABNORMAL LOW (ref 0.80–1.80)
Retic Ct Abs: 13.11 10*3/uL — ABNORMAL LOW (ref 34.80–93.90)
WBC: 3.6 10*3/uL — AB (ref 4.0–10.3)
lymph#: 1.2 10*3/uL (ref 0.9–3.3)

## 2014-12-02 MED ORDER — EPOETIN ALFA 40000 UNIT/ML IJ SOLN
40000.0000 [IU] | INTRAMUSCULAR | Status: DC
  Administered 2014-12-02: 40000 [IU] via SUBCUTANEOUS
  Filled 2014-12-02: qty 1

## 2014-12-02 NOTE — Telephone Encounter (Signed)
Per notes 3.1.16

## 2014-12-02 NOTE — Patient Instructions (Signed)

## 2014-12-23 ENCOUNTER — Ambulatory Visit (HOSPITAL_BASED_OUTPATIENT_CLINIC_OR_DEPARTMENT_OTHER): Payer: Medicare Other

## 2014-12-23 ENCOUNTER — Other Ambulatory Visit (HOSPITAL_BASED_OUTPATIENT_CLINIC_OR_DEPARTMENT_OTHER): Payer: Medicare Other

## 2014-12-23 VITALS — BP 122/58 | HR 59 | Temp 97.8°F

## 2014-12-23 DIAGNOSIS — D469 Myelodysplastic syndrome, unspecified: Secondary | ICD-10-CM

## 2014-12-23 DIAGNOSIS — D696 Thrombocytopenia, unspecified: Secondary | ICD-10-CM

## 2014-12-23 DIAGNOSIS — E538 Deficiency of other specified B group vitamins: Secondary | ICD-10-CM

## 2014-12-23 DIAGNOSIS — D72819 Decreased white blood cell count, unspecified: Secondary | ICD-10-CM | POA: Diagnosis not present

## 2014-12-23 LAB — CBC & DIFF AND RETIC
BASO%: 0.9 % (ref 0.0–2.0)
Basophils Absolute: 0 10*3/uL (ref 0.0–0.1)
EOS ABS: 0.3 10*3/uL (ref 0.0–0.5)
EOS%: 7.6 % — AB (ref 0.0–7.0)
HCT: 31.6 % — ABNORMAL LOW (ref 38.4–49.9)
HGB: 10.1 g/dL — ABNORMAL LOW (ref 13.0–17.1)
IMMATURE RETIC FRACT: 2.9 % — AB (ref 3.00–10.60)
LYMPH#: 1 10*3/uL (ref 0.9–3.3)
LYMPH%: 30.2 % (ref 14.0–49.0)
MCH: 33.3 pg (ref 27.2–33.4)
MCHC: 32 g/dL (ref 32.0–36.0)
MCV: 104.3 fL — AB (ref 79.3–98.0)
MONO#: 0.4 10*3/uL (ref 0.1–0.9)
MONO%: 11.4 % (ref 0.0–14.0)
NEUT#: 1.7 10*3/uL (ref 1.5–6.5)
NEUT%: 49.9 % (ref 39.0–75.0)
NRBC: 0 % (ref 0–0)
Platelets: 92 10*3/uL — ABNORMAL LOW (ref 140–400)
RBC: 3.03 10*6/uL — AB (ref 4.20–5.82)
RDW: 14.8 % — AB (ref 11.0–14.6)
Retic Ct Abs: 11.21 10*3/uL — ABNORMAL LOW (ref 34.80–93.90)
WBC: 3.4 10*3/uL — ABNORMAL LOW (ref 4.0–10.3)

## 2014-12-23 MED ORDER — EPOETIN ALFA 40000 UNIT/ML IJ SOLN
40000.0000 [IU] | INTRAMUSCULAR | Status: DC
  Administered 2014-12-23: 40000 [IU] via SUBCUTANEOUS
  Filled 2014-12-23: qty 1

## 2015-01-14 ENCOUNTER — Other Ambulatory Visit (HOSPITAL_BASED_OUTPATIENT_CLINIC_OR_DEPARTMENT_OTHER): Payer: Medicare Other

## 2015-01-14 ENCOUNTER — Ambulatory Visit (HOSPITAL_BASED_OUTPATIENT_CLINIC_OR_DEPARTMENT_OTHER): Payer: Medicare Other

## 2015-01-14 VITALS — BP 120/49 | HR 55 | Temp 98.4°F

## 2015-01-14 DIAGNOSIS — D469 Myelodysplastic syndrome, unspecified: Secondary | ICD-10-CM | POA: Diagnosis not present

## 2015-01-14 LAB — CBC & DIFF AND RETIC
BASO%: 1 % (ref 0.0–2.0)
Basophils Absolute: 0 10*3/uL (ref 0.0–0.1)
EOS%: 6.4 % (ref 0.0–7.0)
Eosinophils Absolute: 0.3 10*3/uL (ref 0.0–0.5)
HCT: 32.5 % — ABNORMAL LOW (ref 38.4–49.9)
HGB: 10.6 g/dL — ABNORMAL LOW (ref 13.0–17.1)
IMMATURE RETIC FRACT: 0 % — AB (ref 3.00–10.60)
LYMPH#: 1.1 10*3/uL (ref 0.9–3.3)
LYMPH%: 26.6 % (ref 14.0–49.0)
MCH: 34 pg — AB (ref 27.2–33.4)
MCHC: 32.6 g/dL (ref 32.0–36.0)
MCV: 104.2 fL — ABNORMAL HIGH (ref 79.3–98.0)
MONO#: 0.4 10*3/uL (ref 0.1–0.9)
MONO%: 9.4 % (ref 0.0–14.0)
NEUT#: 2.3 10*3/uL (ref 1.5–6.5)
NEUT%: 56.6 % (ref 39.0–75.0)
Platelets: 93 10*3/uL — ABNORMAL LOW (ref 140–400)
RBC: 3.12 10*6/uL — ABNORMAL LOW (ref 4.20–5.82)
RDW: 15.3 % — ABNORMAL HIGH (ref 11.0–14.6)
Retic Ct Abs: 10.61 10*3/uL — ABNORMAL LOW (ref 34.80–93.90)
WBC: 4.1 10*3/uL (ref 4.0–10.3)

## 2015-01-14 MED ORDER — EPOETIN ALFA 20000 UNIT/ML IJ SOLN
40000.0000 [IU] | INTRAMUSCULAR | Status: DC
  Administered 2015-01-14: 40000 [IU] via SUBCUTANEOUS
  Filled 2015-01-14: qty 2

## 2015-01-14 NOTE — Patient Instructions (Signed)

## 2015-02-03 ENCOUNTER — Other Ambulatory Visit (HOSPITAL_BASED_OUTPATIENT_CLINIC_OR_DEPARTMENT_OTHER): Payer: Medicare Other

## 2015-02-03 ENCOUNTER — Ambulatory Visit (HOSPITAL_BASED_OUTPATIENT_CLINIC_OR_DEPARTMENT_OTHER): Payer: Medicare Other

## 2015-02-03 VITALS — BP 102/50 | HR 59 | Temp 98.3°F

## 2015-02-03 DIAGNOSIS — D469 Myelodysplastic syndrome, unspecified: Secondary | ICD-10-CM

## 2015-02-03 LAB — CBC & DIFF AND RETIC
BASO%: 0.9 % (ref 0.0–2.0)
Basophils Absolute: 0 10*3/uL (ref 0.0–0.1)
EOS ABS: 0.2 10*3/uL (ref 0.0–0.5)
EOS%: 5.9 % (ref 0.0–7.0)
HCT: 32.2 % — ABNORMAL LOW (ref 38.4–49.9)
HGB: 10.5 g/dL — ABNORMAL LOW (ref 13.0–17.1)
Immature Retic Fract: 0 % — ABNORMAL LOW (ref 3.00–10.60)
LYMPH#: 1 10*3/uL (ref 0.9–3.3)
LYMPH%: 28 % (ref 14.0–49.0)
MCH: 33.4 pg (ref 27.2–33.4)
MCHC: 32.6 g/dL (ref 32.0–36.0)
MCV: 102.5 fL — ABNORMAL HIGH (ref 79.3–98.0)
MONO#: 0.4 10*3/uL (ref 0.1–0.9)
MONO%: 12.7 % (ref 0.0–14.0)
NEUT#: 1.8 10*3/uL (ref 1.5–6.5)
NEUT%: 52.5 % (ref 39.0–75.0)
PLATELETS: 101 10*3/uL — AB (ref 140–400)
RBC: 3.14 10*6/uL — ABNORMAL LOW (ref 4.20–5.82)
RDW: 15.3 % — ABNORMAL HIGH (ref 11.0–14.6)
Retic Ct Abs: 10.68 10*3/uL — ABNORMAL LOW (ref 34.80–93.90)
WBC: 3.4 10*3/uL — ABNORMAL LOW (ref 4.0–10.3)

## 2015-02-03 MED ORDER — EPOETIN ALFA 20000 UNIT/ML IJ SOLN
40000.0000 [IU] | INTRAMUSCULAR | Status: DC
  Administered 2015-02-03: 40000 [IU] via SUBCUTANEOUS
  Filled 2015-02-03: qty 2

## 2015-02-24 ENCOUNTER — Other Ambulatory Visit (HOSPITAL_BASED_OUTPATIENT_CLINIC_OR_DEPARTMENT_OTHER): Payer: Medicare Other

## 2015-02-24 ENCOUNTER — Ambulatory Visit (HOSPITAL_BASED_OUTPATIENT_CLINIC_OR_DEPARTMENT_OTHER): Payer: Medicare Other

## 2015-02-24 VITALS — BP 113/53 | HR 55 | Temp 98.3°F

## 2015-02-24 DIAGNOSIS — D469 Myelodysplastic syndrome, unspecified: Secondary | ICD-10-CM | POA: Diagnosis not present

## 2015-02-24 LAB — CBC & DIFF AND RETIC
BASO%: 1.7 % (ref 0.0–2.0)
Basophils Absolute: 0.1 10*3/uL (ref 0.0–0.1)
EOS ABS: 0.2 10*3/uL (ref 0.0–0.5)
EOS%: 7.2 % — ABNORMAL HIGH (ref 0.0–7.0)
HCT: 32.9 % — ABNORMAL LOW (ref 38.4–49.9)
HGB: 10.8 g/dL — ABNORMAL LOW (ref 13.0–17.1)
IMMATURE RETIC FRACT: 1.3 % — AB (ref 3.00–10.60)
LYMPH%: 46.6 % (ref 14.0–49.0)
MCH: 33.5 pg — ABNORMAL HIGH (ref 27.2–33.4)
MCHC: 32.8 g/dL (ref 32.0–36.0)
MCV: 102.2 fL — ABNORMAL HIGH (ref 79.3–98.0)
MONO#: 0.3 10*3/uL (ref 0.1–0.9)
MONO%: 10 % (ref 0.0–14.0)
NEUT#: 1 10*3/uL — ABNORMAL LOW (ref 1.5–6.5)
NEUT%: 34.5 % — AB (ref 39.0–75.0)
PLATELETS: 94 10*3/uL — AB (ref 140–400)
RBC: 3.22 10*6/uL — ABNORMAL LOW (ref 4.20–5.82)
RDW: 15.4 % — ABNORMAL HIGH (ref 11.0–14.6)
Retic %: <0.38 % — ABNORMAL LOW (ref 0.5–1.6)
Retic Ct Abs: 8.69 10*3/uL — ABNORMAL LOW (ref 34.80–93.90)
WBC: 2.9 10*3/uL — ABNORMAL LOW (ref 4.0–10.3)
lymph#: 1.4 10*3/uL (ref 0.9–3.3)

## 2015-02-24 MED ORDER — EPOETIN ALFA 20000 UNIT/ML IJ SOLN
40000.0000 [IU] | INTRAMUSCULAR | Status: DC
  Administered 2015-02-24: 40000 [IU] via SUBCUTANEOUS
  Filled 2015-02-24: qty 2

## 2015-03-18 ENCOUNTER — Other Ambulatory Visit (HOSPITAL_BASED_OUTPATIENT_CLINIC_OR_DEPARTMENT_OTHER): Payer: Medicare Other

## 2015-03-18 ENCOUNTER — Ambulatory Visit (HOSPITAL_BASED_OUTPATIENT_CLINIC_OR_DEPARTMENT_OTHER): Payer: Medicare Other

## 2015-03-18 VITALS — BP 131/56 | HR 57 | Temp 98.3°F

## 2015-03-18 DIAGNOSIS — D469 Myelodysplastic syndrome, unspecified: Secondary | ICD-10-CM | POA: Diagnosis not present

## 2015-03-18 LAB — CBC & DIFF AND RETIC
BASO%: 0.5 % (ref 0.0–2.0)
Basophils Absolute: 0 10*3/uL (ref 0.0–0.1)
EOS%: 7.9 % — ABNORMAL HIGH (ref 0.0–7.0)
Eosinophils Absolute: 0.3 10*3/uL (ref 0.0–0.5)
HCT: 33 % — ABNORMAL LOW (ref 38.4–49.9)
HEMOGLOBIN: 10.6 g/dL — AB (ref 13.0–17.1)
Immature Retic Fract: 2.1 % — ABNORMAL LOW (ref 3.00–10.60)
LYMPH%: 26.7 % (ref 14.0–49.0)
MCH: 33.1 pg (ref 27.2–33.4)
MCHC: 32.1 g/dL (ref 32.0–36.0)
MCV: 103.1 fL — AB (ref 79.3–98.0)
MONO#: 0.5 10*3/uL (ref 0.1–0.9)
MONO%: 11.3 % (ref 0.0–14.0)
NEUT%: 53.6 % (ref 39.0–75.0)
NEUTROS ABS: 2.2 10*3/uL (ref 1.5–6.5)
NRBC: 0 % (ref 0–0)
Platelets: 107 10*3/uL — ABNORMAL LOW (ref 140–400)
RBC: 3.2 10*6/uL — AB (ref 4.20–5.82)
RDW: 15.6 % — AB (ref 11.0–14.6)
Retic %: <0.38 % — ABNORMAL LOW (ref 0.5–1.6)
Retic Ct Abs: 10.56 10*3/uL — ABNORMAL LOW (ref 34.80–93.90)
WBC: 4.2 10*3/uL (ref 4.0–10.3)
lymph#: 1.1 10*3/uL (ref 0.9–3.3)

## 2015-03-18 MED ORDER — EPOETIN ALFA 40000 UNIT/ML IJ SOLN
40000.0000 [IU] | INTRAMUSCULAR | Status: DC
  Administered 2015-03-18: 40000 [IU] via SUBCUTANEOUS
  Filled 2015-03-18: qty 1

## 2015-03-18 MED ORDER — EPOETIN ALFA 20000 UNIT/ML IJ SOLN
40000.0000 [IU] | INTRAMUSCULAR | Status: DC
  Filled 2015-03-18: qty 2

## 2015-03-27 ENCOUNTER — Ambulatory Visit (INDEPENDENT_AMBULATORY_CARE_PROVIDER_SITE_OTHER): Payer: Medicare Other | Admitting: *Deleted

## 2015-03-27 ENCOUNTER — Ambulatory Visit (INDEPENDENT_AMBULATORY_CARE_PROVIDER_SITE_OTHER): Payer: Medicare Other | Admitting: Interventional Cardiology

## 2015-03-27 ENCOUNTER — Encounter: Payer: Self-pay | Admitting: Interventional Cardiology

## 2015-03-27 VITALS — BP 136/62 | HR 61 | Ht 70.0 in | Wt 158.8 lb

## 2015-03-27 DIAGNOSIS — I25810 Atherosclerosis of coronary artery bypass graft(s) without angina pectoris: Secondary | ICD-10-CM

## 2015-03-27 DIAGNOSIS — Z950 Presence of cardiac pacemaker: Secondary | ICD-10-CM

## 2015-03-27 DIAGNOSIS — Z79899 Other long term (current) drug therapy: Secondary | ICD-10-CM | POA: Diagnosis not present

## 2015-03-27 DIAGNOSIS — I495 Sick sinus syndrome: Secondary | ICD-10-CM | POA: Diagnosis not present

## 2015-03-27 DIAGNOSIS — I480 Paroxysmal atrial fibrillation: Secondary | ICD-10-CM | POA: Diagnosis not present

## 2015-03-27 DIAGNOSIS — I739 Peripheral vascular disease, unspecified: Secondary | ICD-10-CM | POA: Diagnosis not present

## 2015-03-27 DIAGNOSIS — I4729 Other ventricular tachycardia: Secondary | ICD-10-CM

## 2015-03-27 DIAGNOSIS — I472 Ventricular tachycardia: Secondary | ICD-10-CM

## 2015-03-27 DIAGNOSIS — N184 Chronic kidney disease, stage 4 (severe): Secondary | ICD-10-CM

## 2015-03-27 DIAGNOSIS — I5032 Chronic diastolic (congestive) heart failure: Secondary | ICD-10-CM

## 2015-03-27 LAB — CUP PACEART INCLINIC DEVICE CHECK
Brady Statistic RA Percent Paced: 47 %
Date Time Interrogation Session: 20160915181034
Lead Channel Impedance Value: 359 Ohm
Lead Channel Pacing Threshold Amplitude: 0.75 V
Lead Channel Pacing Threshold Amplitude: 1.75 V
Lead Channel Sensing Intrinsic Amplitude: 6.1 mV
Lead Channel Setting Sensing Sensitivity: 1.5 mV
MDC IDC MSMT BATTERY IMPEDANCE: 4600 Ohm
MDC IDC MSMT BATTERY VOLTAGE: 2.75 V
MDC IDC MSMT LEADCHNL RA IMPEDANCE VALUE: 405 Ohm
MDC IDC MSMT LEADCHNL RA PACING THRESHOLD PULSEWIDTH: 0.5 ms
MDC IDC MSMT LEADCHNL RA SENSING INTR AMPL: 1.8 mV
MDC IDC MSMT LEADCHNL RV PACING THRESHOLD PULSEWIDTH: 0.5 ms
MDC IDC SET LEADCHNL RA PACING AMPLITUDE: 2 V
MDC IDC SET LEADCHNL RV PACING AMPLITUDE: 2 V
MDC IDC SET LEADCHNL RV PACING PULSEWIDTH: 0.5 ms
MDC IDC STAT BRADY RV PERCENT PACED: 1.7 %
Pulse Gen Model: 5376
Pulse Gen Serial Number: 971105

## 2015-03-27 LAB — HEPATIC FUNCTION PANEL
ALT: 8 U/L (ref 0–53)
AST: 18 U/L (ref 0–37)
Albumin: 4.5 g/dL (ref 3.5–5.2)
Alkaline Phosphatase: 39 U/L (ref 39–117)
BILIRUBIN DIRECT: 0.1 mg/dL (ref 0.0–0.3)
BILIRUBIN TOTAL: 0.5 mg/dL (ref 0.2–1.2)
TOTAL PROTEIN: 7.6 g/dL (ref 6.0–8.3)

## 2015-03-27 LAB — TSH: TSH: 2.03 u[IU]/mL (ref 0.35–4.50)

## 2015-03-27 NOTE — Progress Notes (Signed)
Add-on pacemaker check in clinic. Normal device function. Thresholds, sensing, impedances consistent with previous measurements. Device programmed to maximize longevity. Episode triggers remain disabled, no mode switches. Device programmed at appropriate safety margins; RV output remains on AutoCapture. Histogram distribution appropriate for patient activity level. Device programmed to optimize intrinsic conduction. Estimated longevity 2.25-3.25 years. Patient education completed. ROV with JA in 10/2015.

## 2015-03-27 NOTE — Patient Instructions (Signed)
Medication Instructions:  Your physician recommends that you continue on your current medications as directed. Please refer to the Current Medication list given to you today.   Labwork: Tsh, Hepatic today  Testing/Procedures: None ordered  Follow-Up: Your physician wants you to follow-up in: 6 months with Dr.Smith You will receive a reminder letter in the mail two months in advance. If you don't receive a letter, please call our office to schedule the follow-up appointment.    Any Other Special Instructions Will Be Listed Below (If Applicable). Call the office if you are having prolonged palpitatins

## 2015-03-27 NOTE — Progress Notes (Signed)
Cardiology Office Note   Date:  03/27/2015   ID:  [name] Marble Sr., DOB [birth year], MRN [id number]  PCP:  [name], MD  Cardiologist:  Sinclair Grooms, MD   Chief Complaint  Patient presents with  . Coronary Artery Disease  . Atrial Fibrillation      History of Present Illness: [name] Edgin. is a 79 y.o. male who presents for CAD, PAD, paroxysmal atrial fibrillation, chronic diastolic heart failure, chronic kidney disease stage IV, amiodarone therapy, nonsustained ventricular tachycardia, mesenteric stenosis, essential hypertension, and permanent pacemaker for tachycardia-bradycardia syndrome.  Doing relatively well. He had a 2 to three-week history of weakness, dyspnea, and palpitations. He didn't call us about those complaints. He did not have angina. He denies orthopnea PND. There is no lower extremity swelling. He did not have syncope other complaints.  Past Medical History  Diagnosis Date  . Hypertension   . Hyperlipidemia   . Ulcer   . GERD (gastroesophageal reflux disease)   . DVT (deep venous thrombosis) 2011    Right arm  . Atrial fibrillation   . Thrombocytopenia, unspecified 04/24/2013  . Gout   . Depressive disorder   . Internal hemorrhoids without mention of complication   . Gastroparesis   . Osteopenia   . Ischemic colitis   . Peripheral artery disease   . Diabetes mellitus type 2 with peripheral artery disease   . Chronic diastolic heart failure   . Liddle's syndrome   . Renal artery stenosis   . CKD (chronic kidney disease)   . Vitamin B 12 deficiency   . CAD (coronary artery disease)     s/b CABG 1994, and subsequent stents. Repeat CABG 12/2011,  . Prostate cancer 1997    XRT and lupron  . Myelodysplastic syndrome 05/22/2013    With low hemoglobin and platelets treated with Procrit  . Angiomyolipoma 2009    On both kidneys noted in 2009  . Sick sinus syndrome     Past Surgical History  Procedure Laterality Date  .  Hiatal hernia repair      and ulcer repair  . Appendectomy  1991  . Pacemaker insertion  10/18/1994    DDD pacemaker, [employer]. Gen change 12/10/2003.  Marland Kitchen Coronary artery bypass graft  01/22/1993  . Cholecystectomy  Oct 2009    Laparoscopic  . Coronary artery bypass graft  01/03/2012    Procedure: REDO CORONARY ARTERY BYPASS GRAFTING (CABG);  Surgeon: Gaye Pollack, MD;  Location: [facility];  Service: Open Heart Surgery;  Laterality: N/A;  Redo CABG x  using bilateral internal mammary arteries;  left leg greater saphenous vein harvested endoscopically  . Celiac artery angioplasty  05-16-12    and stenting  . Partial gastrectomy  1969    Hx of ulcer s/p partial gastrectomy/ has pernicious anemia  . Other surgical history  02/13/13    superior mesenteric artery angiogram  . Thrombectomy / embolectomy subclavian artery  02/02/10    Right subclavian thromboectomy and venous angioplasty, and chronic mesenteric ischemia with Herculink stenting to superior mesenteric and celiac arteries - Dr. Trula Slade  . Other surgical history  05/16/12    Stent in stomach  . Pacemaker generator change  12/10/2003    SJM Identity XL DR performed by Dr Leonia Reeves  . Visceral angiogram N/A 05/25/2011    Procedure: VISCERAL ANGIOGRAM;  Surgeon: Serafina Mitchell, MD;  Location: [facility] CATH LAB;  Service: Cardiovascular;  Laterality: N/A;  . Abdominal angiogram N/A  05/25/2011    Procedure: ABDOMINAL ANGIOGRAM;  Surgeon: Serafina Mitchell, MD;  Location: [facility] CATH LAB;  Service: Cardiovascular;  Laterality: N/A;  . Lower extremity angiogram Bilateral 05/25/2011    Procedure: LOWER EXTREMITY ANGIOGRAM;  Surgeon: Serafina Mitchell, MD;  Location: [facility] CATH LAB;  Service: Cardiovascular;  Laterality: Bilateral;  . Left heart catheterization with coronary/graft angiogram  12/24/2011    Procedure: LEFT HEART CATHETERIZATION WITH Beatrix Fetters;  Surgeon: Sinclair Grooms, MD;  Location: [facility] CATH LAB;  Service: Cardiovascular;;  . Visceral  angiogram Bilateral 12/28/2011    Procedure: VISCERAL ANGIOGRAM;  Surgeon: Serafina Mitchell, MD;  Location: [facility] CATH LAB;  Service: Cardiovascular;  Laterality: Bilateral;  . Visceral angiogram N/A 05/16/2012    Procedure: VISCERAL ANGIOGRAM;  Surgeon: Serafina Mitchell, MD;  Location: [facility] CATH LAB;  Service: Cardiovascular;  Laterality: N/A;  . Visceral angiogram N/A 02/13/2013    Procedure: VISCERAL ANGIOGRAM;  Surgeon: Serafina Mitchell, MD;  Location: [facility] CATH LAB;  Service: Cardiovascular;  Laterality: N/A;  . Renal angiogram N/A 02/13/2013    Procedure: RENAL ANGIOGRAM;  Surgeon: Serafina Mitchell, MD;  Location: [facility] CATH LAB;  Service: Cardiovascular;  Laterality: N/A;  . Abdominal angiogram N/A 02/13/2013    Procedure: ABDOMINAL ANGIOGRAM;  Surgeon: Serafina Mitchell, MD;  Location: [facility] CATH LAB;  Service: Cardiovascular;  Laterality: N/A;     Current Outpatient Prescriptions  Medication Sig Dispense Refill  . acetaminophen (ARTHRITIS PAIN RELIEF) 650 MG CR tablet Take 1,300 mg by mouth 2 (two) times daily.     Marland Kitchen amiodarone (PACERONE) 200 MG tablet Take one-half tablet by  mouth daily 45 tablet 3  . amLODipine (NORVASC) 10 MG tablet Take 10 mg by mouth daily.    Marland Kitchen aspirin 81 MG tablet Take 1 tablet (81 mg total) by mouth daily.    Marland Kitchen atorvastatin (LIPITOR) 80 MG tablet Take 80 mg by mouth daily.    Marland Kitchen CALCIUM PO Take 2,000 mg by mouth daily.     . carvedilol (COREG) 25 MG tablet TAKE 1 TABLET BY MOUTH  TWICE DAILY WITH MEAL 180 tablet 3  . cholecalciferol (VITAMIN D) 1000 UNITS tablet Take 1,000 Units by mouth daily.     . cloNIDine (CATAPRES) 0.1 MG tablet Take 0.1 mg by mouth 2 (two) times daily.    . clopidogrel (PLAVIX) 75 MG tablet Take 1 tablet by mouth  daily 90 tablet 3  . furosemide (LASIX) 80 MG tablet TAKE 1 AND 1/2 TABLETS BY  MOUTH TWICE DAILY 135 tablet 3  . hydrALAZINE (APRESOLINE) 50 MG tablet Take 1 tablet (50 mg total) by mouth 3 (three) times daily. 90 tablet 3  . isosorbide mononitrate  (IMDUR) 120 MG 24 hr tablet Take 1 tablet by mouth  daily 90 tablet 3  . pantoprazole (PROTONIX) 40 MG tablet Take 40 mg by mouth daily.    . potassium chloride SA (K-DUR,KLOR-CON) 20 MEQ tablet Take 20 mEq by mouth 4 (four) times daily.     . silodosin (RAPAFLO) 8 MG CAPS capsule Take 8 mg by mouth daily.      No current facility-administered medications for this visit.    Allergies:   Nsaids; Ibuprofen; and Ace inhibitors    Social History:  The patient  reports that he quit smoking about 45 years ago. His smoking use included Cigarettes. He quit after 20 years of use. He has never used smokeless tobacco. He reports that he does not drink alcohol or  use illicit drugs.   Family History:  The patient's family history includes CAD in his brother; CVA (age of onset: 57) in his mother; Cancer in his father, paternal uncle, and sister; Diabetes in his mother and sister; Heart attack in his daughter; Heart disease in his brother, daughter, mother, and sister; Hyperlipidemia in his mother; Hypertension in his daughter, father, mother, and sister.    ROS:  Please see the history of present illness.   Otherwise, review of systems are positive for neck pain, shortness of breath on exertion, abdominal discomfort, irregular heartbeat, leg pain, excessive fatigue, and intermittent headaches. He will use nitroglycerin because it causes headaches..   All other systems are reviewed and negative.    PHYSICAL EXAM: VS:  BP 136/62 mmHg  Pulse 61  Ht 5\' 10"  (1.778 m)  Wt 72.031 kg (158 lb 12.8 oz)  BMI 22.79 kg/m2  SpO2 97% , BMI Body mass index is 22.79 kg/(m^2). GEN: Well nourished, well developed, in no acute distress HEENT: normal Neck: no JVD, carotid bruits, or masses Cardiac: RRR.  There is no murmur, rub, or gallop. There is none edema. Respiratory:  clear to auscultation bilaterally, normal work of breathing. GI: soft, nontender, nondistended, + BS MS: no deformity or atrophy Skin: warm and  dry, no rash Neuro:  Strength and sensation are intact Psych: euthymic mood, full affect   EKG:  EKG is not ordered today.    Recent Labs: 04/03/2014: BUN 35*; Creatinine, Ser 2.52*; Potassium 3.3*; Sodium 142 09/10/2014: ALT 9; TSH 4.43 03/18/2015: HGB 10.6*; Platelets 107*    Lipid Panel    Component Value Date/Time   CHOL 104 12/25/2011 0416   TRIG 41 12/25/2011 0416   HDL 56 12/25/2011 0416   CHOLHDL 1.9 12/25/2011 0416   VLDL 8 12/25/2011 0416   LDLCALC 40 12/25/2011 0416      Wt Readings from Last 3 Encounters:  03/27/15 72.031 kg (158 lb 12.8 oz)  11/11/14 72.485 kg (159 lb 12.8 oz)  10/17/14 73.573 kg (162 lb 3.2 oz)      Other studies Reviewed: Additional studies/ records that were reviewed today include: Review of pacemaker evaluation. Reviewed recent laboratory data.. The findings include he is followed by Dr. Posey Pronto for chronic kidney failure. This is been stable..    ASSESSMENT AND PLAN:  1. PAF (paroxysmal atrial fibrillation) Possible recent recurrences. I will scrutinize the pacer checked that we did today.  2. PAD (peripheral artery disease) Stable  3. On amiodarone therapy No evidence of toxicity  4. Pacemaker Pacemaker was interrogated today and function appears to be stable  5. NSVT (nonsustained ventricular tachycardia) No specific complaints. We'll interrogate pacemaker for evidence of arrhythmia  6. CAD of autologous bypass graft No angina  7. Chronic diastolic heart failure No evidence of volume overload  8. CKD (chronic kidney disease), stage IV Stable according to the patient    Current medicines are reviewed at length with the patient today.  The patient has the following concerns regarding medicines: None.  The following changes/actions have been instituted:    He is cautioned to call if he begins having arrhythmia or filling dyspneic. I suspect he could be having atrial fibrillation.  If he continues to have difficulty or  if we see breakthrough atrial fibrillation on pacer interrogation, may need to consider anticoagulation therapy.  TSH and hepatic panel today on amiodarone therapy. This be repeated when he returns in 6 months.  Labs/ tests ordered today include:  No orders  of the defined types were placed in this encounter.     Disposition:   FU with HS in 6 months  Signed, Sinclair Grooms, MD  03/27/2015 2:43 PM    [address], [place], [place]  [address] Phone: [phone]; Fax: [phone]

## 2015-04-01 ENCOUNTER — Telehealth: Payer: Self-pay

## 2015-04-01 NOTE — Telephone Encounter (Signed)
Pt aware of lab results.  Liver and thyroid is normal. Repeat in 6 months.  Pt verbalized understanding

## 2015-04-04 ENCOUNTER — Encounter: Payer: Self-pay | Admitting: Internal Medicine

## 2015-04-07 ENCOUNTER — Ambulatory Visit (HOSPITAL_BASED_OUTPATIENT_CLINIC_OR_DEPARTMENT_OTHER): Payer: Medicare Other

## 2015-04-07 ENCOUNTER — Other Ambulatory Visit (HOSPITAL_BASED_OUTPATIENT_CLINIC_OR_DEPARTMENT_OTHER): Payer: Medicare Other

## 2015-04-07 VITALS — BP 154/63 | HR 57 | Temp 98.5°F

## 2015-04-07 DIAGNOSIS — D469 Myelodysplastic syndrome, unspecified: Secondary | ICD-10-CM | POA: Diagnosis not present

## 2015-04-07 LAB — CBC & DIFF AND RETIC
BASO%: 1.2 % (ref 0.0–2.0)
Basophils Absolute: 0.1 10*3/uL (ref 0.0–0.1)
EOS%: 7.7 % — ABNORMAL HIGH (ref 0.0–7.0)
Eosinophils Absolute: 0.3 10*3/uL (ref 0.0–0.5)
HCT: 33.7 % — ABNORMAL LOW (ref 38.4–49.9)
HGB: 10.7 g/dL — ABNORMAL LOW (ref 13.0–17.1)
IMMATURE RETIC FRACT: 2 % — AB (ref 3.00–10.60)
LYMPH#: 1.3 10*3/uL (ref 0.9–3.3)
LYMPH%: 30.1 % (ref 14.0–49.0)
MCH: 33.2 pg (ref 27.2–33.4)
MCHC: 31.8 g/dL — ABNORMAL LOW (ref 32.0–36.0)
MCV: 104.7 fL — ABNORMAL HIGH (ref 79.3–98.0)
MONO#: 0.5 10*3/uL (ref 0.1–0.9)
MONO%: 11.5 % (ref 0.0–14.0)
NEUT%: 49.5 % (ref 39.0–75.0)
NEUTROS ABS: 2.1 10*3/uL (ref 1.5–6.5)
Platelets: 110 10*3/uL — ABNORMAL LOW (ref 140–400)
RBC: 3.22 10*6/uL — AB (ref 4.20–5.82)
RDW: 15.6 % — AB (ref 11.0–14.6)
RETIC CT ABS: 11.59 10*3/uL — AB (ref 34.80–93.90)
WBC: 4.2 10*3/uL (ref 4.0–10.3)

## 2015-04-07 MED ORDER — EPOETIN ALFA 40000 UNIT/ML IJ SOLN
40000.0000 [IU] | INTRAMUSCULAR | Status: DC
  Administered 2015-04-07: 40000 [IU] via SUBCUTANEOUS
  Filled 2015-04-07: qty 1

## 2015-04-28 ENCOUNTER — Other Ambulatory Visit (HOSPITAL_BASED_OUTPATIENT_CLINIC_OR_DEPARTMENT_OTHER): Payer: Medicare Other

## 2015-04-28 ENCOUNTER — Ambulatory Visit (HOSPITAL_BASED_OUTPATIENT_CLINIC_OR_DEPARTMENT_OTHER): Payer: Medicare Other

## 2015-04-28 VITALS — BP 146/56 | HR 59 | Temp 98.6°F

## 2015-04-28 DIAGNOSIS — D696 Thrombocytopenia, unspecified: Secondary | ICD-10-CM

## 2015-04-28 DIAGNOSIS — D72819 Decreased white blood cell count, unspecified: Secondary | ICD-10-CM | POA: Diagnosis not present

## 2015-04-28 DIAGNOSIS — E538 Deficiency of other specified B group vitamins: Secondary | ICD-10-CM

## 2015-04-28 DIAGNOSIS — D469 Myelodysplastic syndrome, unspecified: Secondary | ICD-10-CM

## 2015-04-28 LAB — CBC & DIFF AND RETIC
BASO%: 1 % (ref 0.0–2.0)
BASOS ABS: 0 10*3/uL (ref 0.0–0.1)
EOS ABS: 0.4 10*3/uL (ref 0.0–0.5)
EOS%: 9.8 % — ABNORMAL HIGH (ref 0.0–7.0)
HEMATOCRIT: 32.1 % — AB (ref 38.4–49.9)
HEMOGLOBIN: 10.4 g/dL — AB (ref 13.0–17.1)
IMMATURE RETIC FRACT: 0.9 % — AB (ref 3.00–10.60)
LYMPH#: 1.4 10*3/uL (ref 0.9–3.3)
LYMPH%: 36 % (ref 14.0–49.0)
MCH: 33.4 pg (ref 27.2–33.4)
MCHC: 32.4 g/dL (ref 32.0–36.0)
MCV: 103.2 fL — ABNORMAL HIGH (ref 79.3–98.0)
MONO#: 0.5 10*3/uL (ref 0.1–0.9)
MONO%: 12.1 % (ref 0.0–14.0)
NEUT#: 1.6 10*3/uL (ref 1.5–6.5)
NEUT%: 41.1 % (ref 39.0–75.0)
Platelets: 107 10*3/uL — ABNORMAL LOW (ref 140–400)
RBC: 3.11 10*6/uL — ABNORMAL LOW (ref 4.20–5.82)
RDW: 15.6 % — ABNORMAL HIGH (ref 11.0–14.6)
RETIC %: 0.39 % — AB (ref 0.80–1.80)
RETIC CT ABS: 12.13 10*3/uL — AB (ref 34.80–93.90)
WBC: 4 10*3/uL (ref 4.0–10.3)
nRBC: 0 % (ref 0–0)

## 2015-04-28 MED ORDER — EPOETIN ALFA 40000 UNIT/ML IJ SOLN
40000.0000 [IU] | INTRAMUSCULAR | Status: DC
  Administered 2015-04-28: 40000 [IU] via SUBCUTANEOUS
  Filled 2015-04-28: qty 1

## 2015-05-19 ENCOUNTER — Other Ambulatory Visit: Payer: Medicare Other

## 2015-05-19 ENCOUNTER — Ambulatory Visit (HOSPITAL_BASED_OUTPATIENT_CLINIC_OR_DEPARTMENT_OTHER): Payer: Medicare Other | Admitting: Hematology and Oncology

## 2015-05-19 ENCOUNTER — Other Ambulatory Visit (HOSPITAL_BASED_OUTPATIENT_CLINIC_OR_DEPARTMENT_OTHER): Payer: Medicare Other

## 2015-05-19 ENCOUNTER — Telehealth: Payer: Self-pay | Admitting: Hematology and Oncology

## 2015-05-19 ENCOUNTER — Telehealth: Payer: Self-pay | Admitting: *Deleted

## 2015-05-19 ENCOUNTER — Ambulatory Visit (HOSPITAL_BASED_OUTPATIENT_CLINIC_OR_DEPARTMENT_OTHER): Payer: Medicare Other

## 2015-05-19 ENCOUNTER — Ambulatory Visit: Payer: Medicare Other

## 2015-05-19 ENCOUNTER — Encounter: Payer: Self-pay | Admitting: Hematology and Oncology

## 2015-05-19 ENCOUNTER — Other Ambulatory Visit: Payer: Self-pay | Admitting: Hematology and Oncology

## 2015-05-19 VITALS — BP 141/67 | HR 60 | Temp 97.7°F | Resp 20 | Ht 70.0 in | Wt 160.5 lb

## 2015-05-19 DIAGNOSIS — D469 Myelodysplastic syndrome, unspecified: Secondary | ICD-10-CM

## 2015-05-19 DIAGNOSIS — R319 Hematuria, unspecified: Secondary | ICD-10-CM

## 2015-05-19 DIAGNOSIS — D696 Thrombocytopenia, unspecified: Secondary | ICD-10-CM

## 2015-05-19 LAB — URINALYSIS, MICROSCOPIC - CHCC
BLOOD: NEGATIVE
Bilirubin (Urine): NEGATIVE
GLUCOSE UR CHCC: NEGATIVE mg/dL
Ketones: NEGATIVE mg/dL
LEUKOCYTE ESTERASE: NEGATIVE
Nitrite: NEGATIVE
PH: 6 (ref 4.6–8.0)
RBC / HPF: NEGATIVE (ref 0–2)
SPECIFIC GRAVITY, URINE: 1.01 (ref 1.003–1.035)
UROBILINOGEN UR: 0.2 mg/dL (ref 0.2–1)

## 2015-05-19 LAB — CBC & DIFF AND RETIC
BASO%: 0.9 % (ref 0.0–2.0)
BASOS ABS: 0 10*3/uL (ref 0.0–0.1)
EOS ABS: 0.2 10*3/uL (ref 0.0–0.5)
EOS%: 4.5 % (ref 0.0–7.0)
HCT: 32.5 % — ABNORMAL LOW (ref 38.4–49.9)
HGB: 10.2 g/dL — ABNORMAL LOW (ref 13.0–17.1)
IMMATURE RETIC FRACT: 1.3 % — AB (ref 3.00–10.60)
LYMPH#: 1.4 10*3/uL (ref 0.9–3.3)
LYMPH%: 32.9 % (ref 14.0–49.0)
MCH: 32.7 pg (ref 27.2–33.4)
MCHC: 31.4 g/dL — AB (ref 32.0–36.0)
MCV: 104.2 fL — ABNORMAL HIGH (ref 79.3–98.0)
MONO#: 0.5 10*3/uL (ref 0.1–0.9)
MONO%: 11 % (ref 0.0–14.0)
NEUT#: 2.2 10*3/uL (ref 1.5–6.5)
NEUT%: 50.7 % (ref 39.0–75.0)
Platelets: 102 10*3/uL — ABNORMAL LOW (ref 140–400)
RBC: 3.12 10*6/uL — AB (ref 4.20–5.82)
RDW: 15.5 % — AB (ref 11.0–14.6)
RETIC CT ABS: 8.42 10*3/uL — AB (ref 34.80–93.90)
WBC: 4.3 10*3/uL (ref 4.0–10.3)

## 2015-05-19 MED ORDER — EPOETIN ALFA 20000 UNIT/ML IJ SOLN
40000.0000 [IU] | INTRAMUSCULAR | Status: DC
  Administered 2015-05-19: 40000 [IU] via SUBCUTANEOUS
  Filled 2015-05-19: qty 2

## 2015-05-19 NOTE — Assessment & Plan Note (Signed)
The patient has been getting erythropoietin stimulating agents for a while. Previously, with excellent response to treatment, we reduced the frequency of injection to every 4 weeks. However, recently, I have increased the frequency of injection to every 3 weeks due to persistent anemia and he agreed to proceed.  He is doing well. We will continue on the dose to 40,000 units every 3 weeks to keep hemoglobin greater than 11.   I will see him back in 6 months for further assessment

## 2015-05-19 NOTE — Telephone Encounter (Signed)
-----   Message from Heath Lark, MD sent at 05/19/2015  1:12 PM EST ----- Regarding: UA Pls call lab to cancel urine culture UA looks normal Please call patient and let him know I recommend he follows with urologist for management (he had history of prostate ca) ----- Message -----    From: Lab in [facility] Interface    Sent: 05/19/2015   1:11 PM      To: Heath Lark, MD

## 2015-05-19 NOTE — Progress Notes (Signed)
[name] OFFICE PROGRESS NOTE  Patient Care Team: Hulan Fess, MD as PCP - General (Family Medicine) Belva Crome, MD (Cardiology) Laurence Spates, MD (Gastroenterology) Lowella Bandy, MD (Urology) Heath Lark, MD as Consulting Physician (Hematology and Oncology) Patsey Berthold, NP as Nurse Practitioner (Cardiology)  SUMMARY OF ONCOLOGIC HISTORY:  DIAGNOSIS: Low-grade myelodysplastic syndrome, history prostate cancer, on erythropoietin stimulating agents for chronic anemia  SUMMARY OF ONCOLOGIC HISTORY: This is a very interesting gentleman with a history of prostate cancer diagnosed in 1997, status post radiation and seed implantation and Lupron injection. The patient cannot recall the stage of his disease. He's been getting Lupron injection every 4 months. He does not recall his last PSA number but was never told he require urgent attention of further treatment apart from just getting Lupron injection. He was also found to have chronic anemia in the past and has been placed on oral ion supplements 4 years of which he took twice a day. His last ferritin level was over 1300. The patient also has extensive EGD and colonoscopy done which showed no evidence of active bleeding. He had B12 level checked recently and was found to have B12 deficiency and was started on B12 injections. The patient also has significant anemia requiring hospitalization and recent blood transfusion. On 05/10/2013 he had bone marrow aspirate and biopsy which is consistent with myelodysplastic syndrome. On 06/22/2013: We started him on erythropoietin stimulating agents for anemia.  INTERVAL HISTORY: Please see below for problem oriented charting.  he feels well. He has intermittent hematuria over the past few months. He denies any symptoms of suprapubic discomfort and denies signs and symptoms of infection such as dysuria, frequency urgency or fevers.  He tolerated the injections well. Denies any signs and  symptoms of congestive heart failure.  REVIEW OF SYSTEMS:   Constitutional: Denies fevers, chills or abnormal weight loss Eyes: Denies blurriness of vision Ears, nose, mouth, throat, and face: Denies mucositis or sore throat Respiratory: Denies cough, dyspnea or wheezes Cardiovascular: Denies palpitation, chest discomfort or lower extremity swelling Gastrointestinal:  Denies nausea, heartburn or change in bowel habits Skin: Denies abnormal skin rashes Lymphatics: Denies new lymphadenopathy or easy bruising Neurological:Denies numbness, tingling or new weaknesses Behavioral/Psych: Mood is stable, no new changes  All other systems were reviewed with the patient and are negative.  I have reviewed the past medical history, past surgical history, social history and family history with the patient and they are unchanged from previous note.  ALLERGIES:  is allergic to nsaids; ibuprofen; and ace inhibitors.  MEDICATIONS:  Current Outpatient Prescriptions  Medication Sig Dispense Refill  . acetaminophen (ARTHRITIS PAIN RELIEF) 650 MG CR tablet Take 1,300 mg by mouth 2 (two) times daily.     Marland Kitchen amiodarone (PACERONE) 200 MG tablet Take one-half tablet by  mouth daily 45 tablet 3  . amLODipine (NORVASC) 10 MG tablet Take 10 mg by mouth daily.    Marland Kitchen aspirin 81 MG tablet Take 1 tablet (81 mg total) by mouth daily.    Marland Kitchen atorvastatin (LIPITOR) 80 MG tablet Take 80 mg by mouth daily.    Marland Kitchen CALCIUM PO Take 2,000 mg by mouth daily.     . carvedilol (COREG) 25 MG tablet TAKE 1 TABLET BY MOUTH  TWICE DAILY WITH MEAL 180 tablet 3  . cholecalciferol (VITAMIN D) 1000 UNITS tablet Take 1,000 Units by mouth daily.     . cloNIDine (CATAPRES) 0.1 MG tablet Take 0.1 mg by mouth 2 (two) times  daily.    . clopidogrel (PLAVIX) 75 MG tablet Take 1 tablet by mouth  daily 90 tablet 3  . furosemide (LASIX) 80 MG tablet TAKE 1 AND 1/2 TABLETS BY  MOUTH TWICE DAILY 135 tablet 3  . hydrALAZINE (APRESOLINE) 50 MG tablet Take  1 tablet (50 mg total) by mouth 3 (three) times daily. 90 tablet 3  . isosorbide mononitrate (IMDUR) 120 MG 24 hr tablet Take 1 tablet by mouth  daily 90 tablet 3  . potassium chloride SA (K-DUR,KLOR-CON) 20 MEQ tablet Take 20 mEq by mouth 4 (four) times daily.     . silodosin (RAPAFLO) 8 MG CAPS capsule Take 8 mg by mouth daily.     . pantoprazole (PROTONIX) 40 MG tablet Take 40 mg by mouth daily.     No current facility-administered medications for this visit.   Facility-Administered Medications Ordered in Other Visits  Medication Dose Route Frequency Provider Last Rate Last Dose  . epoetin alfa (EPOGEN,PROCRIT) injection 40,000 Units  40,000 Units Subcutaneous Weekly Heath Lark, MD   40,000 Units at 05/19/15 1220    PHYSICAL EXAMINATION: ECOG PERFORMANCE STATUS: 0 - Asymptomatic  Filed Vitals:   05/19/15 1133  BP: 141/67  Pulse: 60  Temp: 97.7 F (36.5 C)  Resp: 20   Filed Weights   05/19/15 1133  Weight: 160 lb 8 oz (72.802 kg)    GENERAL:alert, no distress and comfortable SKIN: skin color, texture, turgor are normal, no rashes or significant lesions EYES: normal, Conjunctiva are pink and non-injected, sclera clear OROPHARYNX:no exudate, no erythema and lips, buccal mucosa, and tongue normal  NECK: supple, thyroid normal size, non-tender, without nodularity LYMPH:  no palpable lymphadenopathy in the cervical, axillary or inguinal LUNGS: clear to auscultation and percussion with normal breathing effort HEART: regular rate & rhythm and no murmurs and no lower extremity edema ABDOMEN:abdomen soft, non-tender and normal bowel sounds Musculoskeletal:no cyanosis of digits and no clubbing  NEURO: alert & oriented x 3 with fluent speech, no focal motor/sensory deficits  LABORATORY DATA:  I have reviewed the data as listed    Component Value Date/Time   NA 142 04/03/2014 1830   NA 146* 08/10/2013 1324   K 3.3* 04/03/2014 1830   K 3.9 08/10/2013 1324   CL 102 04/03/2014  1830   CO2 24 04/03/2014 1830   CO2 27 08/10/2013 1324   GLUCOSE 135* 04/03/2014 1830   GLUCOSE 105 08/10/2013 1324   BUN 35* 04/03/2014 1830   BUN 40.3* 08/10/2013 1324   CREATININE 2.52* 04/03/2014 1830   CREATININE 2.6* 08/10/2013 1324   CALCIUM 9.6 04/03/2014 1830   CALCIUM 9.4 08/10/2013 1324   PROT 7.6 03/27/2015 1500   PROT 7.0 08/10/2013 1324   ALBUMIN 4.5 03/27/2015 1500   ALBUMIN 3.8 08/10/2013 1324   AST 18 03/27/2015 1500   AST 14 08/10/2013 1324   ALT 8 03/27/2015 1500   ALT 8 08/10/2013 1324   ALKPHOS 39 03/27/2015 1500   ALKPHOS 47 08/10/2013 1324   BILITOT 0.5 03/27/2015 1500   BILITOT 0.33 08/10/2013 1324   GFRNONAA 23* 04/03/2014 1830   GFRAA 27* 04/03/2014 1830    No results found for: SPEP, UPEP  Lab Results  Component Value Date   WBC 4.3 05/19/2015   NEUTROABS 2.2 05/19/2015   HGB 10.2* 05/19/2015   HCT 32.5* 05/19/2015   MCV 104.2* 05/19/2015   PLT 102* 05/19/2015      Chemistry      Component Value Date/Time  NA 142 04/03/2014 1830   NA 146* 08/10/2013 1324   K 3.3* 04/03/2014 1830   K 3.9 08/10/2013 1324   CL 102 04/03/2014 1830   CO2 24 04/03/2014 1830   CO2 27 08/10/2013 1324   BUN 35* 04/03/2014 1830   BUN 40.3* 08/10/2013 1324   CREATININE 2.52* 04/03/2014 1830   CREATININE 2.6* 08/10/2013 1324      Component Value Date/Time   CALCIUM 9.6 04/03/2014 1830   CALCIUM 9.4 08/10/2013 1324   ALKPHOS 39 03/27/2015 1500   ALKPHOS 47 08/10/2013 1324   AST 18 03/27/2015 1500   AST 14 08/10/2013 1324   ALT 8 03/27/2015 1500   ALT 8 08/10/2013 1324   BILITOT 0.5 03/27/2015 1500   BILITOT 0.33 08/10/2013 1324       ASSESSMENT & PLAN:  MDS (myelodysplastic syndrome) The patient has been getting erythropoietin stimulating agents for a while. Previously, with excellent response to treatment, we reduced the frequency of injection to every 4 weeks. However, recently, I have increased the frequency of injection to every 3 weeks due  to persistent anemia and he agreed to proceed.  He is doing well. We will continue on the dose to 40,000 units every 3 weeks to keep hemoglobin greater than 11.   I will see him back in 6 months for further assessment  Thrombocytopenia This is due to his underlying bone marrow disorder. He is not symptomatic. Recommend close observation.    Hematuria  He had recent hematuria on 05/12/2015. He denies any suprapubic pain or discomfort or signs and symptoms of infection such as fevers or chills. I recommend urinalysis and urine culture and he agreed to proceed. I will call the patient with test results.   Orders Placed This Encounter  Procedures  . Urine culture    Standing Status: Future     Number of Occurrences: 1     Standing Expiration Date: 06/22/2016  . Urinalysis, Microscopic - [facility]    Standing Status: Future     Number of Occurrences: 1     Standing Expiration Date: 06/22/2016   All questions were answered. The patient knows to call the clinic with any problems, questions or concerns. No barriers to learning was detected. I spent 15 minutes counseling the patient face to face. The total time spent in the appointment was 20 minutes and more than 50% was on counseling and review of test results     [facility], [place], MD 05/19/2015 12:59 PM

## 2015-05-19 NOTE — Telephone Encounter (Signed)
GAVE PATIENT AVS REPORT AND APPOINTMENTS FOR November 2016 THRU MAY 2017.

## 2015-05-19 NOTE — Telephone Encounter (Signed)
Informed pt of U/A normal and to see Urologist for the blood in urine.   Pt verbalized understanding and he has appt to see Dr. Alyson Ingles, Urology this month on 11/18.

## 2015-05-19 NOTE — Assessment & Plan Note (Signed)
This is due to his underlying bone marrow disorder. He is not symptomatic. Recommend close observation. 

## 2015-05-19 NOTE — Assessment & Plan Note (Signed)
He had recent hematuria on 05/12/2015. He denies any suprapubic pain or discomfort or signs and symptoms of infection such as fevers or chills. I recommend urinalysis and urine culture and he agreed to proceed. I will call the patient with test results.

## 2015-05-22 ENCOUNTER — Other Ambulatory Visit: Payer: Self-pay | Admitting: Interventional Cardiology

## 2015-05-22 MED ORDER — FUROSEMIDE 80 MG PO TABS: ORAL_TABLET | ORAL | Status: DC

## 2015-06-09 ENCOUNTER — Other Ambulatory Visit (HOSPITAL_BASED_OUTPATIENT_CLINIC_OR_DEPARTMENT_OTHER): Payer: Medicare Other

## 2015-06-09 ENCOUNTER — Ambulatory Visit (HOSPITAL_BASED_OUTPATIENT_CLINIC_OR_DEPARTMENT_OTHER): Payer: Medicare Other

## 2015-06-09 VITALS — BP 145/71 | HR 60 | Temp 98.2°F

## 2015-06-09 DIAGNOSIS — D469 Myelodysplastic syndrome, unspecified: Secondary | ICD-10-CM

## 2015-06-09 LAB — CBC & DIFF AND RETIC
BASO%: 1 % (ref 0.0–2.0)
BASOS ABS: 0 10*3/uL (ref 0.0–0.1)
EOS%: 10.9 % — ABNORMAL HIGH (ref 0.0–7.0)
Eosinophils Absolute: 0.3 10*3/uL (ref 0.0–0.5)
HCT: 32.2 % — ABNORMAL LOW (ref 38.4–49.9)
HGB: 10.2 g/dL — ABNORMAL LOW (ref 13.0–17.1)
IMMATURE RETIC FRACT: 5.3 % (ref 3.00–10.60)
LYMPH#: 1.2 10*3/uL (ref 0.9–3.3)
LYMPH%: 40.1 % (ref 14.0–49.0)
MCH: 32.7 pg (ref 27.2–33.4)
MCHC: 31.7 g/dL — AB (ref 32.0–36.0)
MCV: 103.2 fL — ABNORMAL HIGH (ref 79.3–98.0)
MONO#: 0.3 10*3/uL (ref 0.1–0.9)
MONO%: 9.9 % (ref 0.0–14.0)
NEUT#: 1.2 10*3/uL — ABNORMAL LOW (ref 1.5–6.5)
NEUT%: 38.1 % — AB (ref 39.0–75.0)
PLATELETS: 89 10*3/uL — AB (ref 140–400)
RBC: 3.12 10*6/uL — AB (ref 4.20–5.82)
RDW: 15.7 % — AB (ref 11.0–14.6)
RETIC %: 0.4 % — AB (ref 0.80–1.80)
RETIC CT ABS: 12.48 10*3/uL — AB (ref 34.80–93.90)
WBC: 3 10*3/uL — ABNORMAL LOW (ref 4.0–10.3)

## 2015-06-09 MED ORDER — EPOETIN ALFA 20000 UNIT/ML IJ SOLN
40000.0000 [IU] | INTRAMUSCULAR | Status: DC
  Administered 2015-06-09: 40000 [IU] via SUBCUTANEOUS

## 2015-06-09 MED ORDER — EPOETIN ALFA 40000 UNIT/ML IJ SOLN
40000.0000 [IU] | Freq: Once | INTRAMUSCULAR | Status: DC
  Filled 2015-06-09: qty 1

## 2015-06-09 NOTE — Addendum Note (Signed)
Addended by: Tora Kindred on: 06/09/2015 12:05 PM   Modules accepted: Orders

## 2015-06-23 ENCOUNTER — Telehealth: Payer: Self-pay | Admitting: Hematology and Oncology

## 2015-06-23 NOTE — Telephone Encounter (Signed)
returned pt call and s.w. pt and cx all appt per pt request...Marland Kitchenhe will call back when he is ready to r/s

## 2015-06-30 ENCOUNTER — Ambulatory Visit: Payer: Medicare Other

## 2015-06-30 ENCOUNTER — Other Ambulatory Visit: Payer: Medicare Other

## 2015-07-10 ENCOUNTER — Other Ambulatory Visit: Payer: Self-pay | Admitting: Hematology and Oncology

## 2015-07-21 ENCOUNTER — Other Ambulatory Visit: Payer: Medicare Other

## 2015-07-21 ENCOUNTER — Ambulatory Visit: Payer: Medicare Other

## 2015-08-11 ENCOUNTER — Ambulatory Visit: Payer: Medicare Other

## 2015-08-11 ENCOUNTER — Other Ambulatory Visit: Payer: Medicare Other

## 2015-08-29 ENCOUNTER — Inpatient Hospital Stay (HOSPITAL_COMMUNITY)
Admission: EM | Admit: 2015-08-29 | Discharge: 2015-08-31 | DRG: 394 | Disposition: A | Discharge status: Home / Self Care | Payer: Medicare Other | Attending: Internal Medicine | Admitting: Internal Medicine

## 2015-08-29 ENCOUNTER — Encounter (HOSPITAL_COMMUNITY): Payer: Self-pay | Admitting: Emergency Medicine

## 2015-08-29 DIAGNOSIS — I10 Essential (primary) hypertension: Secondary | ICD-10-CM | POA: Diagnosis present

## 2015-08-29 DIAGNOSIS — K4090 Unilateral inguinal hernia, without obstruction or gangrene, not specified as recurrent: Secondary | ICD-10-CM | POA: Diagnosis present

## 2015-08-29 DIAGNOSIS — Z950 Presence of cardiac pacemaker: Secondary | ICD-10-CM | POA: Diagnosis present

## 2015-08-29 DIAGNOSIS — Z515 Encounter for palliative care: Secondary | ICD-10-CM | POA: Diagnosis not present

## 2015-08-29 DIAGNOSIS — I495 Sick sinus syndrome: Secondary | ICD-10-CM | POA: Diagnosis present

## 2015-08-29 DIAGNOSIS — I5032 Chronic diastolic (congestive) heart failure: Secondary | ICD-10-CM | POA: Diagnosis present

## 2015-08-29 DIAGNOSIS — E1151 Type 2 diabetes mellitus with diabetic peripheral angiopathy without gangrene: Secondary | ICD-10-CM | POA: Diagnosis present

## 2015-08-29 DIAGNOSIS — Z7189 Other specified counseling: Secondary | ICD-10-CM | POA: Diagnosis not present

## 2015-08-29 DIAGNOSIS — K4091 Unilateral inguinal hernia, without obstruction or gangrene, recurrent: Secondary | ICD-10-CM

## 2015-08-29 DIAGNOSIS — N184 Chronic kidney disease, stage 4 (severe): Secondary | ICD-10-CM | POA: Diagnosis present

## 2015-08-29 DIAGNOSIS — I480 Paroxysmal atrial fibrillation: Secondary | ICD-10-CM | POA: Diagnosis present

## 2015-08-29 DIAGNOSIS — E119 Type 2 diabetes mellitus without complications: Secondary | ICD-10-CM | POA: Diagnosis not present

## 2015-08-29 DIAGNOSIS — K551 Chronic vascular disorders of intestine: Principal | ICD-10-CM | POA: Diagnosis present

## 2015-08-29 DIAGNOSIS — K921 Melena: Secondary | ICD-10-CM | POA: Diagnosis not present

## 2015-08-29 DIAGNOSIS — I130 Hypertensive heart and chronic kidney disease with heart failure and stage 1 through stage 4 chronic kidney disease, or unspecified chronic kidney disease: Secondary | ICD-10-CM | POA: Diagnosis present

## 2015-08-29 DIAGNOSIS — E1122 Type 2 diabetes mellitus with diabetic chronic kidney disease: Secondary | ICD-10-CM | POA: Diagnosis present

## 2015-08-29 DIAGNOSIS — R109 Unspecified abdominal pain: Secondary | ICD-10-CM

## 2015-08-29 DIAGNOSIS — K3184 Gastroparesis: Secondary | ICD-10-CM | POA: Diagnosis present

## 2015-08-29 DIAGNOSIS — D469 Myelodysplastic syndrome, unspecified: Secondary | ICD-10-CM | POA: Diagnosis present

## 2015-08-29 DIAGNOSIS — I739 Peripheral vascular disease, unspecified: Secondary | ICD-10-CM | POA: Diagnosis present

## 2015-08-29 DIAGNOSIS — K922 Gastrointestinal hemorrhage, unspecified: Secondary | ICD-10-CM

## 2015-08-29 HISTORY — DX: Peptic ulcer, site unspecified, unspecified as acute or chronic, without hemorrhage or perforation: K27.9

## 2015-08-29 LAB — COMPREHENSIVE METABOLIC PANEL
ALBUMIN: 4.3 g/dL (ref 3.5–5.0)
ALK PHOS: 38 U/L (ref 38–126)
ALT: 9 U/L — ABNORMAL LOW (ref 17–63)
ANION GAP: 11 (ref 5–15)
AST: 20 U/L (ref 15–41)
BUN: 36 mg/dL — AB (ref 6–20)
CALCIUM: 9.7 mg/dL (ref 8.9–10.3)
CO2: 26 mmol/L (ref 22–32)
Chloride: 105 mmol/L (ref 101–111)
Creatinine, Ser: 2.71 mg/dL — ABNORMAL HIGH (ref 0.61–1.24)
GFR calc Af Amer: 24 mL/min — ABNORMAL LOW (ref >60)
GFR calc non Af Amer: 21 mL/min — ABNORMAL LOW (ref >60)
GLUCOSE: 197 mg/dL — AB (ref 65–99)
Potassium: 4.1 mmol/L (ref 3.5–5.1)
SODIUM: 142 mmol/L (ref 135–145)
Total Bilirubin: 0.4 mg/dL (ref 0.3–1.2)
Total Protein: 7.3 g/dL (ref 6.5–8.1)

## 2015-08-29 LAB — URINALYSIS, ROUTINE W REFLEX MICROSCOPIC
BILIRUBIN URINE: NEGATIVE
Glucose, UA: NEGATIVE mg/dL
HGB URINE DIPSTICK: NEGATIVE
Ketones, ur: NEGATIVE mg/dL
Leukocytes, UA: NEGATIVE
Nitrite: NEGATIVE
PROTEIN: 30 mg/dL — AB
Specific Gravity, Urine: 1.011 (ref 1.005–1.030)
pH: 6 (ref 5.0–8.0)

## 2015-08-29 LAB — CBC
HCT: 25.5 % — ABNORMAL LOW (ref 39.0–52.0)
HEMOGLOBIN: 8.1 g/dL — AB (ref 13.0–17.0)
MCH: 32.3 pg (ref 26.0–34.0)
MCHC: 31.8 g/dL (ref 30.0–36.0)
MCV: 101.6 fL — ABNORMAL HIGH (ref 78.0–100.0)
Platelets: 111 10*3/uL — ABNORMAL LOW (ref 150–400)
RBC: 2.51 MIL/uL — ABNORMAL LOW (ref 4.22–5.81)
RDW: 15.8 % — ABNORMAL HIGH (ref 11.5–15.5)
WBC: 3.7 10*3/uL — ABNORMAL LOW (ref 4.0–10.5)

## 2015-08-29 LAB — URINE MICROSCOPIC-ADD ON

## 2015-08-29 LAB — I-STAT CG4 LACTIC ACID, ED: Lactic Acid, Venous: 0.93 mmol/L (ref 0.5–2.0)

## 2015-08-29 LAB — POC OCCULT BLOOD, ED: Fecal Occult Bld: POSITIVE — AB

## 2015-08-29 MED ORDER — MORPHINE SULFATE (PF) 4 MG/ML IV SOLN
4.0000 mg | Freq: Once | INTRAVENOUS | Status: AC
  Administered 2015-08-29: 4 mg via INTRAVENOUS
  Filled 2015-08-29: qty 1

## 2015-08-29 MED ORDER — SODIUM CHLORIDE 0.9 % IV BOLUS (SEPSIS)
1000.0000 mL | Freq: Once | INTRAVENOUS | Status: AC
  Administered 2015-08-29: 1000 mL via INTRAVENOUS

## 2015-08-29 MED ORDER — IOHEXOL 300 MG/ML  SOLN
25.0000 mL | INTRAMUSCULAR | Status: AC
  Administered 2015-08-30 (×2): 25 mL via ORAL

## 2015-08-29 NOTE — ED Notes (Signed)
Witnessed rectal exam performed by Dr. Darl Householder

## 2015-08-29 NOTE — H&P (Signed)
History and Physical  Patient Name: [name]     [id number]    DOB: 09/21/1935    DOA: 08/29/2015 Referring physician: Shirlyn Goltz, MD PCP: [name], MD      Chief Complaint: Abdominal pain  HPI: [name]. is a 80 y.o. male with a past medical history significant for HFpef, myelodysplastic syndrome, pAF with pacer, CKD IV, and history of mesenteric artery stenting who presents with abdominal pain.  The patient has had about 2 weeks of intermittent crampy right lower quadrant pain associated with a new bulge in his right groin. He was seen in urgent care a week ago, diagnosed with right inguinal hernia, shown how to reduce the hernia and sent home. Yesterday he saw his PCP, who confirmed hernia, initiated referral to general surgery, and recommended that the patient seek urgent medical attention if he ever had pain with inability to reduce the hernia. Unfortunately, this afternoon the patient felt a bulge again, laid down and was able to reduce the hernia, but continued to have cramping right lower quadrant pain, and so came to the ER.  In the ED, his hernia was reducible, not firm, red and he was afebrile and hemodynamically stable.  K4.1, HCO3 26, CR 2.71 (at baseline), lactate normal.  He had pancytopenia because of MDS, and his hemoglobin was 8.1 g/dL from a previous of 10 g/dL. For this reason fecal occult blood testing was performed by the ER, this was positive and described as "black and sticky" by EDP. EDP subsequently discussed this with GI who recommended nothing by mouth status, and admission for evaluation of suspected GIB.  The patient notes that he has "bloating" and "hard stools", but no pain or bloating clearly provoked by eating, no hematochezia or melena at all that he has seen, and no constant pain. If anything, his pain is crampy and mild, and in the right lower quadrant. There is no fever, chills, vomiting.  Denies any NSAID use.  Also, in the  background, the patient was previously getting EPO shots by his oncologist for MDS. However he had outstanding bills and has not had any shots in over 2 months (he was previously on every 3 week schedule).     Review of Systems:  All other systems negative except as just noted or noted in the history of present illness.  Allergies  Allergen Reactions  . Nsaids Other (See Comments)    GI issue  . Ibuprofen Other (See Comments)    GI Issues  . Ace Inhibitors Cough    Prior to Admission medications   Medication Sig Start Date End Date Taking? Authorizing Provider  acetaminophen (ARTHRITIS PAIN RELIEF) 650 MG CR tablet Take 1,300 mg by mouth See admin instructions. Take 2 tablets (1300 mg) by mouth twice daily, may also take 2 tablets (1300 mg) mid day as needed for arthritis pain   Yes Historical Provider, MD  amiodarone (PACERONE) 200 MG tablet Take one-half tablet by  mouth daily 12/02/14  Yes Belva Crome, MD  amLODipine (NORVASC) 10 MG tablet Take 10 mg by mouth daily.   Yes Historical Provider, MD  aspirin 81 MG tablet Take 1 tablet (81 mg total) by mouth daily. 09/10/14  Yes Belva Crome, MD  atorvastatin (LIPITOR) 80 MG tablet Take 80 mg by mouth at bedtime.    Yes Historical Provider, MD  Calcium Carb-Cholecalciferol (CALCIUM 600 + D) 600-200 MG-UNIT TABS Take 1-2 tablets by mouth 2 (two) times daily.  Take 2 tablets by mouth every morning and 1 tablet at night   Yes Historical Provider, MD  carvedilol (COREG) 25 MG tablet TAKE 1 TABLET BY MOUTH  TWICE DAILY WITH MEAL 12/02/14  Yes Belva Crome, MD  cholecalciferol (VITAMIN D) 1000 UNITS tablet Take 1,000 Units by mouth 2 (two) times daily.    Yes Historical Provider, MD  cloNIDine (CATAPRES) 0.1 MG tablet Take 0.1 mg by mouth 2 (two) times daily. 02/19/15  Yes Historical Provider, MD  clopidogrel (PLAVIX) 75 MG tablet Take 1 tablet by mouth  daily 12/02/14  Yes Belva Crome, MD  cyanocobalamin (,VITAMIN B-12,) 1000 MCG/ML injection  Inject 1,000 mcg into the muscle every 30 (thirty) days. Vitamin B12 - last injection 07/24/15   Yes Historical Provider, MD  furosemide (LASIX) 80 MG tablet TAKE 1 AND 1/2 TABLETS BY  MOUTH TWICE DAILY Patient taking differently: Take 120 mg by mouth 2 (two) times daily.  05/22/15  Yes Belva Crome, MD  hydrALAZINE (APRESOLINE) 50 MG tablet Take 1 tablet (50 mg total) by mouth 3 (three) times daily. 11/23/13  Yes Belva Crome, MD  isosorbide mononitrate (IMDUR) 120 MG 24 hr tablet Take 1 tablet by mouth  daily 12/02/14  Yes Belva Crome, MD  Leuprolide Acetate (LUPRON IJ) Inject 1 application as directed every 4 (four) months. Done at urologist's office - last inject December 2016   Yes Historical Provider, MD  potassium chloride SA (K-DUR,KLOR-CON) 20 MEQ tablet Take 20-40 mEq by mouth 3 (three) times daily. Take 2 tablets (40 meq) by mouth with breakfast, take 1 tablet (20 meq) with lunch and supper 06/21/13  Yes Belva Crome, MD  silodosin (RAPAFLO) 8 MG CAPS capsule Take 8 mg by mouth at bedtime.    Yes Historical Provider, MD  epoetin alfa (EPOGEN,PROCRIT) 16109 UNIT/ML injection Inject 10,000 Units into the skin every 21 ( twenty-one) days. Done at [facility] cancer center - last injection December 2016    Historical Provider, MD    Past Medical History  Diagnosis Date  . Hypertension   . Hyperlipidemia   . Ulcer   . GERD (gastroesophageal reflux disease)   . DVT (deep venous thrombosis) ([place]) 2011    Right arm  . Atrial fibrillation ([place])   . Thrombocytopenia, unspecified ([place]) 04/24/2013  . Gout   . Depressive disorder   . Internal hemorrhoids without mention of complication   . Gastroparesis   . Osteopenia   . Ischemic colitis ([place])   . Peripheral artery disease ([place])   . Diabetes mellitus type 2 with peripheral artery disease ([place])   . Chronic diastolic heart failure ([place])   . 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 Renal artery stenosis ([place])   . CKD (chronic kidney disease)   . Vitamin B  12 deficiency   . CAD (coronary artery disease)     s/b CABG 1994, and subsequent stents. Repeat CABG 12/2011,  . Prostate cancer ([place]) 1997    XRT and lupron  . Myelodysplastic syndrome ([place]) 05/22/2013    With low hemoglobin and platelets treated with Procrit  . Angiomyolipoma 2009    On both kidneys noted in 2009  . Sick sinus syndrome ([place])   . Hematuria 05/19/2015  . Peptic ulcer     S/p partial gastrectomy in 1969    Past Surgical History  Procedure Laterality Date  . Hiatal hernia repair      and ulcer repair  . Appendectomy  1991  .  Pacemaker insertion  10/18/1994    DDD pacemaker, [employer]. Gen change 12/10/2003.  Marland Kitchen Coronary artery bypass graft  01/22/1993  . Cholecystectomy  Oct 2009    Laparoscopic  . Coronary artery bypass graft  01/03/2012    Procedure: REDO CORONARY ARTERY BYPASS GRAFTING (CABG);  Surgeon: Gaye Pollack, MD;  Location: [place];  Service: Open Heart Surgery;  Laterality: N/A;  Redo CABG x  using bilateral internal mammary arteries;  left leg greater saphenous vein harvested endoscopically  . Celiac artery angioplasty  05-16-12    and stenting  . Partial gastrectomy  1969    Hx of ulcer s/p partial gastrectomy/ has pernicious anemia  . Other surgical history  02/13/13    superior mesenteric artery angiogram  . Thrombectomy / embolectomy subclavian artery  02/02/10    Right subclavian thromboectomy and venous angioplasty, and chronic mesenteric ischemia with Herculink stenting to superior mesenteric and celiac arteries - Dr. Trula Slade  . Other surgical history  05/16/12    Stent in stomach  . Pacemaker generator change  12/10/2003    SJM Identity XL DR performed by Dr Leonia Reeves  . Visceral angiogram N/A 05/25/2011    Procedure: VISCERAL ANGIOGRAM;  Surgeon: Serafina Mitchell, MD;  Location: [facility] CATH LAB;  Service: Cardiovascular;  Laterality: N/A;  . Abdominal angiogram N/A 05/25/2011    Procedure: ABDOMINAL ANGIOGRAM;  Surgeon: Serafina Mitchell, MD;  Location: [facility] CATH  LAB;  Service: Cardiovascular;  Laterality: N/A;  . Lower extremity angiogram Bilateral 05/25/2011    Procedure: LOWER EXTREMITY ANGIOGRAM;  Surgeon: Serafina Mitchell, MD;  Location: [facility] CATH LAB;  Service: Cardiovascular;  Laterality: Bilateral;  . Left heart catheterization with coronary/graft angiogram  12/24/2011    Procedure: LEFT HEART CATHETERIZATION WITH Beatrix Fetters;  Surgeon: Sinclair Grooms, MD;  Location: [facility] CATH LAB;  Service: Cardiovascular;;  . Visceral angiogram Bilateral 12/28/2011    Procedure: VISCERAL ANGIOGRAM;  Surgeon: Serafina Mitchell, MD;  Location: [facility] CATH LAB;  Service: Cardiovascular;  Laterality: Bilateral;  . Visceral angiogram N/A 05/16/2012    Procedure: VISCERAL ANGIOGRAM;  Surgeon: Serafina Mitchell, MD;  Location: [facility] CATH LAB;  Service: Cardiovascular;  Laterality: N/A;  . Visceral angiogram N/A 02/13/2013    Procedure: VISCERAL ANGIOGRAM;  Surgeon: Serafina Mitchell, MD;  Location: [facility] CATH LAB;  Service: Cardiovascular;  Laterality: N/A;  . Renal angiogram N/A 02/13/2013    Procedure: RENAL ANGIOGRAM;  Surgeon: Serafina Mitchell, MD;  Location: [facility] CATH LAB;  Service: Cardiovascular;  Laterality: N/A;  . Abdominal angiogram N/A 02/13/2013    Procedure: ABDOMINAL ANGIOGRAM;  Surgeon: Serafina Mitchell, MD;  Location: [facility] CATH LAB;  Service: Cardiovascular;  Laterality: N/A;    Family history: family history includes CAD in his brother; CVA (age of onset: 72) in his mother; Cancer in his father, paternal uncle, and sister; Diabetes in his mother and sister; Heart attack in his daughter; Heart disease in his brother, daughter, mother, and sister; Hyperlipidemia in his mother; Hypertension in his daughter, father, mother, and sister.  Social History: Patient lives with his wife. He is a remote former smoker. He is normally ambulatory, functional, and able to participate in all ADLs.       Physical Exam: BP 143/60 mmHg  Pulse 59  Temp(Src) 98.9 F (37.2 C) (Oral)   Resp 18  Ht 5\' 10"  (1.778 m)  Wt 74.39 kg (164 lb)  BMI 23.53 kg/m2  SpO2 96% General appearance: Well-developed, elderly adult  male, alert and in no acute distress.   Eyes: Anicteric, conjunctiva pink, lids and lashes normal.     ENT: No nasal deformity, discharge, or epistaxis.  OP moist without lesions.   Lymph: No cervical or supraclavicular lymphadenopathy. Skin: Warm and dry.  No pallor in conjunctiva or hands.   Cardiac: RRR, nl S1-S2, no murmurs appreciated.  Capillary refill is brisk.  No JVD, no LE edema.  Radial pulses 2+ and symmetric. Respiratory: Normal respiratory rate and rhythm.  CTAB without rales or wheezes. Abdomen: Abdomen soft without rigidity.  Old mid abdomen scars.  Moderate TTP in RLQ and especially over inguinal canal, I do not appreciate a bulge, nor a reducible hernia.  No guarding, rigidity, or rebound. No ascites, distension.   MSK: No deformities or effusions. Neuro: Sensorium intact and responding to questions, attention normal.  Speech is fluent.  Moves all extremities equally and with normal coordination.    Psych: Behavior appropriate.  Affect normal.  No evidence of aural or visual hallucinations or delusions.       Labs on Admission:  The metabolic panel shows sodium, potassium, bicarbonate. The serum creatinine is 2.7 g/dL, which is at baseline for him. Family hyperglycemic. The AST and ALT are normal. Urinalysis shows cast without hematuria or pyuria. FOBT positive. Lactate normal. The complete blood count shows leukopenia, anemia, and thrombocytopenia.   Radiological Exams on Admission: Personally reviewed: Ct Abdomen Pelvis Wo Contrast 08/30/2015  Prominent diffuse calcification of the abdominal aorta and major branch vessels. Cannot exclude calcific stenosis of the celiac axis, superior mesenteric artery, bilateral renal arteries, and bilateral iliac and external iliac arteries. The appendix is not identified. Stool-filled rectosigmoid  colon. No evidence of diverticulitis. Mild degenerative changes in the spine. No destructive bone lesions. Small right inguinal hernia containing fat.        Assessment/Plan 1. Anemia, MDS vs acute blood loss vs chronic blood loss/ischemic colitis:  This is new.  Has not had scheduled EPO for a while.  Has a history of PUD, but denies to me any melena or hematochezia.  No recent NSAID use.  Suspect the patient has worsening Hgb because of untreated MDS (for financial reasons) and now is developing some ischemic colitis on top of it. -Serial H/H overnight and T/S -If CT shows no diverticulitis or incarcerated hernia, I would favor transfusion given Hgb 8 and possibility of ischemic colitis   2. Abdominal pain:  This is new.  Started this week.  In RLQ.  No fever or leukocytosis to suggest diverticulitis.  Seems to be associated with hernia, and not following food.  The differnetial includes hernia pain, mesenteric angina/ischemic colitis, diverticulitis.  Incarceration ruled out with exam.  Pain not out of proportion to exam, but vague crampy tenderness present. -CT abdomen and pelvis without contrast given CKD -Consult to GI, appreciate cares  3. Peripheral vascular disease, intestinal vascular insufficiency, s/p SMA stents:  -Hold clopidogrel/aspirin initially  4. Chronic diastolic CHF and sick sinus syndrome with pAF:  Not on warfarin at baseline.  CHADS2Vasc 5. -Continue furosemide, hydralazine -Continue amiodarone -Holding Plavix and aspirin for now -Holding carvedilol and Imdur given initial hyptonsion on admission  5. HTN:  Somewhat hypotensive initially at admission, but stable. -Continue hydralazine, Imdur -Hold amlodipine, carvedilol, clonidine until hemodynamics are more clear   6. NIDDM:  Hyperglycemic at admission but not on home antiglycemics. -Low dose sliding scale  7. CKD IV:  -Avoid nephrotoxins/contrast     DVT PPx: SCDs Diet: NPO  pending GI  evaluation Consultants: GI Code Status: FULL Family Communication: Wife and son present at bedside  Medical decision making: What exists of the patient's previous chart was reviewed in depth and the case was discussed with Dr. Darl Householder. Patient seen 11:56 PM on 08/29/2015.  Disposition Plan:  I recommend admission to medical surgical bed, inaptient status.  Clinical condition: stable.  Anticipate serial H/H, serial abdominal exams, CT abdomen and pelvis.  GI consultation.  Dispo pending results of above studies/consults.      Edwin Dada [facility] Pager [phone]

## 2015-08-29 NOTE — ED Notes (Signed)
Pt will have to drink contrast prior to CT. Plan to take pt up to floor with contrast and will have to be escorted back down for scan.

## 2015-08-29 NOTE — ED Notes (Signed)
Pt. reports worsening RLQ pain for 2 weeks with mild nausea , denies emesis or diarrhea , no fever or chills. Pt. was seen at [facility] yesterday - no test done during his visit diagnosed with right inguinal hernia.

## 2015-08-29 NOTE — ED Notes (Signed)
Attempted report 

## 2015-08-29 NOTE — ED Provider Notes (Addendum)
CSN: [id number]     Arrival date & time 08/29/15  1904 History   First MD Initiated Contact with Patient 08/29/15 2141     Chief Complaint  Patient presents with  . Abdominal Pain     (Consider location/radiation/quality/duration/timing/severity/associated sxs/prior Treatment) The history is provided by the patient.  [name] OHORA Sr. is a 80 y.o. male hx of HTN, HL, DVT not on anticoagulation, here with abdominal pain. RLQ pain for the last 2 weeks with some nausea. Patient has no vomiting. Patient saw primary care doctor yesterday and was diagnosed with right inguinal hernia. Patient is being referred to surgery. However, today, the pain is worse. States that he is able to push the hernia in but it comes out. Denies vomiting, still passing gas.    Past Medical History  Diagnosis Date  . Hypertension   . Hyperlipidemia   . Ulcer   . GERD (gastroesophageal reflux disease)   . DVT (deep venous thrombosis) ([place]) 2011    Right arm  . Atrial fibrillation ([place])   . Thrombocytopenia, unspecified ([place]) 04/24/2013  . Gout   . Depressive disorder   . Internal hemorrhoids without mention of complication   . Gastroparesis   . Osteopenia   . Ischemic colitis ([place])   . Peripheral artery disease ([place])   . Diabetes mellitus type 2 with peripheral artery disease ([place])   . Chronic diastolic heart failure ([place])   . 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 Renal artery stenosis ([place])   . CKD (chronic kidney disease)   . Vitamin B 12 deficiency   . CAD (coronary artery disease)     s/b CABG 1994, and subsequent stents. Repeat CABG 12/2011,  . Prostate cancer ([place]) 1997    XRT and lupron  . Myelodysplastic syndrome ([place]) 05/22/2013    With low hemoglobin and platelets treated with Procrit  . Angiomyolipoma 2009    On both kidneys noted in 2009  . Sick sinus syndrome ([place])   . Hematuria 05/19/2015   Past Surgical History  Procedure Laterality Date  . Hiatal hernia repair      and ulcer repair  .  Appendectomy  1991  . Pacemaker insertion  10/18/1994    DDD pacemaker, [employer]. Gen change 12/10/2003.  Marland Kitchen Coronary artery bypass graft  01/22/1993  . Cholecystectomy  Oct 2009    Laparoscopic  . Coronary artery bypass graft  01/03/2012    Procedure: REDO CORONARY ARTERY BYPASS GRAFTING (CABG);  Surgeon: Gaye Pollack, MD;  Location: [place];  Service: Open Heart Surgery;  Laterality: N/A;  Redo CABG x  using bilateral internal mammary arteries;  left leg greater saphenous vein harvested endoscopically  . Celiac artery angioplasty  05-16-12    and stenting  . Partial gastrectomy  1969    Hx of ulcer s/p partial gastrectomy/ has pernicious anemia  . Other surgical history  02/13/13    superior mesenteric artery angiogram  . Thrombectomy / embolectomy subclavian artery  02/02/10    Right subclavian thromboectomy and venous angioplasty, and chronic mesenteric ischemia with Herculink stenting to superior mesenteric and celiac arteries - Dr. Trula Slade  . Other surgical history  05/16/12    Stent in stomach  . Pacemaker generator change  12/10/2003    SJM Identity XL DR performed by Dr Leonia Reeves  . Visceral angiogram N/A 05/25/2011    Procedure: VISCERAL ANGIOGRAM;  Surgeon: Serafina Mitchell, MD;  Location: [facility] CATH LAB;  Service: Cardiovascular;  Laterality: N/A;  .  Abdominal angiogram N/A 05/25/2011    Procedure: ABDOMINAL ANGIOGRAM;  Surgeon: Serafina Mitchell, MD;  Location: [facility] CATH LAB;  Service: Cardiovascular;  Laterality: N/A;  . Lower extremity angiogram Bilateral 05/25/2011    Procedure: LOWER EXTREMITY ANGIOGRAM;  Surgeon: Serafina Mitchell, MD;  Location: [facility] CATH LAB;  Service: Cardiovascular;  Laterality: Bilateral;  . Left heart catheterization with coronary/graft angiogram  12/24/2011    Procedure: LEFT HEART CATHETERIZATION WITH Beatrix Fetters;  Surgeon: Sinclair Grooms, MD;  Location: [facility] CATH LAB;  Service: Cardiovascular;;  . Visceral angiogram Bilateral 12/28/2011    Procedure:  VISCERAL ANGIOGRAM;  Surgeon: Serafina Mitchell, MD;  Location: [facility] CATH LAB;  Service: Cardiovascular;  Laterality: Bilateral;  . Visceral angiogram N/A 05/16/2012    Procedure: VISCERAL ANGIOGRAM;  Surgeon: Serafina Mitchell, MD;  Location: [facility] CATH LAB;  Service: Cardiovascular;  Laterality: N/A;  . Visceral angiogram N/A 02/13/2013    Procedure: VISCERAL ANGIOGRAM;  Surgeon: Serafina Mitchell, MD;  Location: [facility] CATH LAB;  Service: Cardiovascular;  Laterality: N/A;  . Renal angiogram N/A 02/13/2013    Procedure: RENAL ANGIOGRAM;  Surgeon: Serafina Mitchell, MD;  Location: [facility] CATH LAB;  Service: Cardiovascular;  Laterality: N/A;  . Abdominal angiogram N/A 02/13/2013    Procedure: ABDOMINAL ANGIOGRAM;  Surgeon: Serafina Mitchell, MD;  Location: [facility] CATH LAB;  Service: Cardiovascular;  Laterality: N/A;   Family History  Problem Relation Age of Onset  . Diabetes Mother   . Heart disease Mother     Heart Disease before age 76  . Hyperlipidemia Mother   . Hypertension Mother   . CVA Mother 23    cause of death  . Cancer Father     stomach/liver  . Hypertension Father     possibly hypertensive  . Cancer Sister     Breast cancer  . Heart disease Daughter     Heart Disease before age 12  . Hypertension Daughter   . Heart attack Daughter   . CAD Brother   . Heart disease Brother   . Cancer Paternal Uncle     colon  . Heart disease Sister   . Diabetes Sister   . Hypertension Sister    Social History  Substance Use Topics  . Smoking status: Former Smoker -- 20 years    Types: Cigarettes    Quit date: 07/12/1969  . Smokeless tobacco: Never Used  . Alcohol Use: No    Review of Systems  Gastrointestinal: Positive for abdominal pain.  All other systems reviewed and are negative.     Allergies  Nsaids; Ibuprofen; and Ace inhibitors  Home Medications   Prior to Admission medications   Medication Sig Start Date End Date Taking? Authorizing Provider  acetaminophen (ARTHRITIS PAIN RELIEF) 650  MG CR tablet Take 1,300 mg by mouth 2 (two) times daily.     Historical Provider, MD  amiodarone (PACERONE) 200 MG tablet Take one-half tablet by  mouth daily 12/02/14   Belva Crome, MD  amLODipine (NORVASC) 10 MG tablet Take 10 mg by mouth daily.    Historical Provider, MD  aspirin 81 MG tablet Take 1 tablet (81 mg total) by mouth daily. 09/10/14   Belva Crome, MD  atorvastatin (LIPITOR) 80 MG tablet Take 80 mg by mouth daily.    Historical Provider, MD  CALCIUM PO Take 2,000 mg by mouth daily.     Historical Provider, MD  carvedilol (COREG) 25 MG tablet TAKE 1 TABLET BY MOUTH  TWICE DAILY WITH MEAL 12/02/14   Belva Crome, MD  cholecalciferol (VITAMIN D) 1000 UNITS tablet Take 1,000 Units by mouth daily.     Historical Provider, MD  cloNIDine (CATAPRES) 0.1 MG tablet Take 0.1 mg by mouth 2 (two) times daily. 02/19/15   Historical Provider, MD  cloNIDine (CATAPRES) 0.2 MG tablet Take 0.2 mg by mouth 2 (two) times daily. 08/14/15   Historical Provider, MD  clopidogrel (PLAVIX) 75 MG tablet Take 1 tablet by mouth  daily 12/02/14   Belva Crome, MD  furosemide (LASIX) 80 MG tablet TAKE 1 AND 1/2 TABLETS BY  MOUTH TWICE DAILY 05/22/15   Belva Crome, MD  hydrALAZINE (APRESOLINE) 50 MG tablet Take 1 tablet (50 mg total) by mouth 3 (three) times daily. 11/23/13   Belva Crome, MD  isosorbide mononitrate (IMDUR) 120 MG 24 hr tablet Take 1 tablet by mouth  daily 12/02/14   Belva Crome, MD  pantoprazole (PROTONIX) 40 MG tablet Take 40 mg by mouth daily.    Historical Provider, MD  potassium chloride SA (K-DUR,KLOR-CON) 20 MEQ tablet Take 20 mEq by mouth 4 (four) times daily.  06/21/13   Belva Crome, MD  silodosin (RAPAFLO) 8 MG CAPS capsule Take 8 mg by mouth daily.     Historical Provider, MD   BP 151/57 mmHg  Pulse 57  Temp(Src) 98.9 F (37.2 C) (Oral)  Resp 18  Ht 5\' 10"  (1.778 m)  Wt 164 lb (74.39 kg)  BMI 23.53 kg/m2  SpO2 98% Physical Exam  Constitutional: He is oriented to person, place,  and time. He appears well-developed and well-nourished.  HENT:  Head: Normocephalic.  Mouth/Throat: Oropharynx is clear and moist.  Eyes: Conjunctivae are normal. Pupils are equal, round, and reactive to light.  Neck: Normal range of motion. Neck supple.  Cardiovascular: Normal rate, regular rhythm and normal heart sounds.   Pulmonary/Chest: Effort normal and breath sounds normal. No respiratory distress. He has no wheezes. He has no rales.  Abdominal: Soft. Bowel sounds are normal. He exhibits no distension. There is no tenderness. There is no rebound.  R inguinal hernia, reducible.   Genitourinary:  No testicular tenderness. Rectal- no hemorrhoids. Dark stool.   Musculoskeletal: Normal range of motion. He exhibits no edema or tenderness.  Neurological: He is alert and oriented to person, place, and time. No cranial nerve deficit. Coordination normal.  Skin: Skin is warm and dry.  Psychiatric: He has a normal mood and affect. His behavior is normal. Judgment and thought content normal.  Nursing note and vitals reviewed.   ED Course  Procedures (including critical care time) Labs Review Labs Reviewed  COMPREHENSIVE METABOLIC PANEL - Abnormal; Notable for the following:    Glucose, Bld 197 (*)    BUN 36 (*)    Creatinine, Ser 2.71 (*)    ALT 9 (*)    GFR calc non Af Amer 21 (*)    GFR calc Af Amer 24 (*)    All other components within normal limits  CBC - Abnormal; Notable for the following:    WBC 3.7 (*)    RBC 2.51 (*)    Hemoglobin 8.1 (*)    HCT 25.5 (*)    MCV 101.6 (*)    RDW 15.8 (*)    Platelets 111 (*)    All other components within normal limits  URINALYSIS, ROUTINE W REFLEX MICROSCOPIC (NOT AT [facility]) - Abnormal; Notable for the following:    Protein, ur  30 (*)    All other components within normal limits  URINE MICROSCOPIC-ADD ON - Abnormal; Notable for the following:    Squamous Epithelial / LPF 0-5 (*)    Bacteria, UA RARE (*)    Casts HYALINE CASTS (*)     All other components within normal limits  POC OCCULT BLOOD, ED  I-STAT CG4 LACTIC ACID, ED    Imaging Review No results found. I have personally reviewed and evaluated these images and lab results as part of my medical decision-making.   EKG Interpretation None      MDM   Final diagnoses:  None    Monico D Michio Bodenschatz. is a 80 y.o. male here with abdominal pain likely from femoral hernia. Inguinal hernia easily reducible. Will check labs, lactate.   10:42 PM Labs showed Hg 8.1, baseline 10. Occ positive, brown stool. Patient is on plavix. Has hx of myelodysplastic syndrome. Consulted Dr. Penelope Coop from GI. Will admit to medicine.    Wandra Arthurs, MD 08/29/15 [id number]  Wandra Arthurs, MD 08/29/15 [phone]

## 2015-08-30 ENCOUNTER — Encounter (HOSPITAL_COMMUNITY): Payer: Self-pay | Admitting: Family Medicine

## 2015-08-30 ENCOUNTER — Inpatient Hospital Stay (HOSPITAL_COMMUNITY): Payer: Medicare Other

## 2015-08-30 DIAGNOSIS — I10 Essential (primary) hypertension: Secondary | ICD-10-CM

## 2015-08-30 LAB — CBC
HCT: 24.3 % — ABNORMAL LOW (ref 39.0–52.0)
HEMATOCRIT: 26.3 % — AB (ref 39.0–52.0)
HEMOGLOBIN: 7.8 g/dL — AB (ref 13.0–17.0)
Hemoglobin: 8.7 g/dL — ABNORMAL LOW (ref 13.0–17.0)
MCH: 32.4 pg (ref 26.0–34.0)
MCH: 33.9 pg (ref 26.0–34.0)
MCHC: 32.1 g/dL (ref 30.0–36.0)
MCHC: 33.1 g/dL (ref 30.0–36.0)
MCV: 100.8 fL — ABNORMAL HIGH (ref 78.0–100.0)
MCV: 102.3 fL — AB (ref 78.0–100.0)
PLATELETS: 107 10*3/uL — AB (ref 150–400)
Platelets: 109 10*3/uL — ABNORMAL LOW (ref 150–400)
RBC: 2.57 MIL/uL — ABNORMAL LOW (ref 4.22–5.81)
RBC: 2.41 MIL/uL — ABNORMAL LOW (ref 4.22–5.81)
RDW: 15.7 % — AB (ref 11.5–15.5)
RDW: 15.9 % — AB (ref 11.5–15.5)
WBC: 3.5 10*3/uL — ABNORMAL LOW (ref 4.0–10.5)
WBC: 3.9 10*3/uL — AB (ref 4.0–10.5)

## 2015-08-30 LAB — GLUCOSE, CAPILLARY
GLUCOSE-CAPILLARY: 112 mg/dL — AB (ref 65–99)
GLUCOSE-CAPILLARY: 138 mg/dL — AB (ref 65–99)
GLUCOSE-CAPILLARY: 81 mg/dL (ref 65–99)
GLUCOSE-CAPILLARY: 86 mg/dL (ref 65–99)
Glucose-Capillary: 116 mg/dL — ABNORMAL HIGH (ref 65–99)

## 2015-08-30 LAB — BASIC METABOLIC PANEL
Anion gap: 9 (ref 5–15)
BUN: 32 mg/dL — AB (ref 6–20)
CALCIUM: 9.1 mg/dL (ref 8.9–10.3)
CHLORIDE: 106 mmol/L (ref 101–111)
CO2: 26 mmol/L (ref 22–32)
CREATININE: 2.39 mg/dL — AB (ref 0.61–1.24)
GFR calc non Af Amer: 25 mL/min — ABNORMAL LOW (ref >60)
GFR, EST AFRICAN AMERICAN: 28 mL/min — AB (ref >60)
Glucose, Bld: 114 mg/dL — ABNORMAL HIGH (ref 65–99)
Potassium: 3.1 mmol/L — ABNORMAL LOW (ref 3.5–5.1)
SODIUM: 141 mmol/L (ref 135–145)

## 2015-08-30 LAB — TYPE AND SCREEN
ABO/RH(D): O POS
ANTIBODY SCREEN: NEGATIVE

## 2015-08-30 LAB — LACTATE DEHYDROGENASE: LDH: 198 U/L — ABNORMAL HIGH (ref 98–192)

## 2015-08-30 MED ORDER — SODIUM CHLORIDE 0.9 % IV SOLN
INTRAVENOUS | Status: DC
  Administered 2015-08-30 – 2015-08-31 (×2): via INTRAVENOUS

## 2015-08-30 MED ORDER — POTASSIUM CHLORIDE CRYS ER 20 MEQ PO TBCR
40.0000 meq | EXTENDED_RELEASE_TABLET | Freq: Every day | ORAL | Status: DC
  Administered 2015-08-30 – 2015-08-31 (×2): 40 meq via ORAL
  Filled 2015-08-30 (×2): qty 2

## 2015-08-30 MED ORDER — AMIODARONE HCL 200 MG PO TABS
100.0000 mg | ORAL_TABLET | Freq: Every day | ORAL | Status: DC
  Administered 2015-08-30 – 2015-08-31 (×2): 100 mg via ORAL
  Filled 2015-08-30 (×2): qty 1

## 2015-08-30 MED ORDER — HYDROCODONE-ACETAMINOPHEN 5-325 MG PO TABS: 1.0000 | ORAL_TABLET | ORAL | Status: DC | PRN

## 2015-08-30 MED ORDER — INSULIN ASPART 100 UNIT/ML ~~LOC~~ SOLN
0.0000 [IU] | Freq: Three times a day (TID) | SUBCUTANEOUS | Status: DC
Start: 2015-08-30 — End: 2015-08-31
  Administered 2015-08-30: 1 [IU] via SUBCUTANEOUS
  Administered 2015-08-31: 2 [IU] via SUBCUTANEOUS

## 2015-08-30 MED ORDER — ATORVASTATIN CALCIUM 80 MG PO TABS
80.0000 mg | ORAL_TABLET | Freq: Every day | ORAL | Status: DC
  Administered 2015-08-30 (×2): 80 mg via ORAL
  Filled 2015-08-30 (×2): qty 1

## 2015-08-30 MED ORDER — FUROSEMIDE 80 MG PO TABS: 120.0000 mg | ORAL_TABLET | Freq: Two times a day (BID) | ORAL | Status: DC

## 2015-08-30 MED ORDER — ACETAMINOPHEN 650 MG RE SUPP: 650.0000 mg | Freq: Four times a day (QID) | RECTAL | Status: DC | PRN

## 2015-08-30 MED ORDER — ACETAMINOPHEN 325 MG PO TABS: 650.0000 mg | ORAL_TABLET | Freq: Four times a day (QID) | ORAL | Status: DC | PRN

## 2015-08-30 MED ORDER — POTASSIUM CHLORIDE CRYS ER 20 MEQ PO TBCR
20.0000 meq | EXTENDED_RELEASE_TABLET | Freq: Two times a day (BID) | ORAL | Status: DC
  Administered 2015-08-30 (×2): 20 meq via ORAL
  Filled 2015-08-30 (×2): qty 1

## 2015-08-30 MED ORDER — TAMSULOSIN HCL 0.4 MG PO CAPS
0.4000 mg | ORAL_CAPSULE | Freq: Every day | ORAL | Status: DC
  Administered 2015-08-30: 0.4 mg via ORAL
  Filled 2015-08-30: qty 1

## 2015-08-30 MED ORDER — PANTOPRAZOLE SODIUM 40 MG IV SOLR
40.0000 mg | Freq: Once | INTRAVENOUS | Status: AC
  Administered 2015-08-30: 40 mg via INTRAVENOUS
  Filled 2015-08-30: qty 40

## 2015-08-30 NOTE — Progress Notes (Addendum)
[facility] PROGRESS NOTE    Progress Note   [name] [id number] DOB: 1935/10/14 DOA: 08/29/2015 PCP: [name], MD   Brief Narrative:   [name]. is an 80 y.o. male past medical history of myelodysplastic syndrome, paroxysmal atrial fibrillation with a pacer, chronic kidney disease stage IV and a history of mesenteric stenting CABG ' 94 who presents to the hospital for abdominal pain. He relates 2 weeks of intermittent cramping right lower quadrant pain. He also has a hernia which he has been referred to general surgery by his PCP. The day prior to admission he was not able to reduce his hernia. Emergency room physician did a rectal examination showed black and sticky stools spoke to GI recommended to admit and place him nothing by mouth for possible procedure.  Assessment/Plan:  Lower GIB (gastrointestinal bleeding)/melena: He denies any recent NSAID use. He has been getting erythropoietin by Dr. core session with good response. CT scan of the abdomen and pelvis did not show an incarcerated hernia. There's no incarcerated hernia by physical exam. Awaiting GI recommendations, keep patient nothing by mouth. Hbg is trending down we'll continue to monitor. 10.8->7.8. As per emergency room physician he had black sticky stools.  Abdominal pain/ Chronic vascular insufficiency of intestine [facility]): His hernia is reducible CT scan shows that is not incarcerated, he has had no fever or leukocytosis. Unlikely an episode of ischemic colitis although he has a history of superior mesenteric stenting 8 2014 Lactic acid was 0.9, check an LDH, keep his blood pressure on the high side.  Chronic diastolic heart failure and sick sinus rhythm with paroxysmal atrial fibrillation: Not on warfarin at baseline. CHADS2Vasc 5. Not a candidate for Coumadin.  Diabetes mellitus ([place]) Continue sliding scale insulin. Final oral anti-glycemic agents at home.  Essential  hypertension: Fairly control, will try to avoid outstanding control of blood pressure, will try to keep on the high side.  CKD (chronic kidney disease), stage IV ([place]): Hold diuretics, patient is nothing by mouth. Creatinine seems to be at baseline 2.3-2.7. KVO IV fluids hold Lasix.  MDS (myelodysplastic syndrome) ([place])  Inguinal hernia: Follow up with Gen. surgery as an outpatient. I cannot appreciate any hernia even with coughing. CT scan did not show an incarcerated hernia, no leukocytosis fever lactic acid < 2. Unlikely to be strangulated    DVT Prophylaxis - Lovenox ordered.  Family Communication: none Disposition Plan: Home when stable. Code Status:     Code Status Orders        Start     Ordered   08/30/15 0033  Full code   Continuous     08/30/15 0033    Code Status History    Date Active Date Inactive Code Status Order ID Comments User Context   06/05/2013  4:19 AM 06/14/2013  5:41 PM Full Code [id number]  Charlton Haws, MD Inpatient   03/16/2013  9:54 PM 03/20/2013  6:18 PM Full Code [id number]  Theressa Millard, MD ED   01/06/2012 11:43 AM 01/09/2012  2:51 PM Full Code [id number]  Orvan July, RN Inpatient        IV Access:    Peripheral IV   Procedures and diagnostic studies:   Ct Abdomen Pelvis Wo Contrast  08/30/2015  CLINICAL DATA:  Mid to lower abdominal pain for 1 week. History of hiatal hernia repair, cholecystectomy, appendectomy. EXAM: CT ABDOMEN AND PELVIS WITHOUT CONTRAST TECHNIQUE: Multidetector CT imaging of the abdomen and pelvis was performed following  the standard protocol without IV contrast. COMPARISON:  04/03/2014 FINDINGS: Areas of atelectasis in the lung bases. Cardiac pacemaker leads. Coronary artery calcifications. Postoperative changes in the mediastinum. Evaluation of solid organs and vascular structures is limited without IV contrast material. Multiple circumscribed low-attenuation lesions are demonstrated in the liver, largest measuring  about 2.7 cm. These have been present on the previous study and likely represent cysts. Gallbladder is surgically absent. No bile duct dilatation. Unenhanced appearance of the pancreas, spleen, adrenal glands, kidneys, inferior vena cava, and retroperitoneal lymph nodes is unremarkable. Prominent diffuse calcification of the abdominal aorta and major branch vessels. Cannot exclude calcific stenosis of the celiac axis, superior mesenteric artery, bilateral renal arteries, and bilateral iliac and external iliac arteries. Stomach, small bowel, and colon are not abnormally distended. Stool fills the colon. No free air or free fluid in the abdomen. Pelvis: Bladder is distended without wall thickening. Prostate gland is enlarged at 5.2 cm diameter. No free or loculated pelvic fluid collections. No pelvic mass or lymphadenopathy. The appendix is not identified. Stool-filled rectosigmoid colon. No evidence of diverticulitis. Mild degenerative changes in the spine. No destructive bone lesions. Small right inguinal hernia containing fat. IMPRESSION: No definite acute process demonstrated in the abdomen or pelvis. Atelectasis in the lung bases. Probable hepatic cysts. Prostate enlargement. Extensive vascular calcifications. Likely there is calcific stenosis of multiple vessels. Electronically Signed   By: Lucienne Capers M.D.   On: 08/30/2015 02:19     Medical Consultants:    None.  Anti-Infectives:   Anti-infectives    None      Subjective:    [name] Marble Sr. He relates his lower abdomen still sore.  Objective:    Filed Vitals:   08/29/15 2300 08/30/15 0038 08/30/15 0234 08/30/15 0502  BP: 143/60 174/81 159/55 144/58  Pulse: 59 64 59 58  Temp:  97.8 F (36.6 C)  98.3 F (36.8 C)  TempSrc:  Oral  Oral  Resp:  16  20  Height:  5\' 10"  (1.778 m)    Weight:  73.2 kg (161 lb 6 oz)    SpO2: 96% 98%  97%    Intake/Output Summary (Last 24 hours) at 08/30/15 0813 Last data filed at 08/30/15  0647  Gross per 24 hour  Intake   1000 ml  Output   1600 ml  Net   -600 ml   Filed Weights   08/29/15 1928 08/30/15 0038  Weight: 74.39 kg (164 lb) 73.2 kg (161 lb 6 oz)    Exam: Gen:  NAD Cardiovascular:  RRR, No M/R/G Chest and lungs:   CTAB Abdomen:  Abdomen soft, mild tenderness on the right lower quadrant no rebound or guarding no erythema.  Extremities:  No edema   Data Reviewed:    Labs: Basic Metabolic Panel:  Recent Labs Lab 08/29/15 1955 08/30/15 0329  NA 142 141  K 4.1 3.1*  CL 105 106  CO2 26 26  GLUCOSE 197* 114*  BUN 36* 32*  CREATININE 2.71* 2.39*  CALCIUM 9.7 9.1   GFR Estimated Creatinine Clearance: 26.7 mL/min (by C-G formula based on Cr of 2.39). Liver Function Tests:  Recent Labs Lab 08/29/15 1955  AST 20  ALT 9*  ALKPHOS 38  BILITOT 0.4  PROT 7.3  ALBUMIN 4.3   No results for input(s): LIPASE, AMYLASE in the last 168 hours. No results for input(s): AMMONIA in the last 168 hours. Coagulation profile No results for input(s): INR, PROTIME in the last 168 hours.  CBC:  Recent Labs Lab 08/29/15 1955 08/30/15 0043 08/30/15 0615  WBC 3.7* 3.9* 3.5*  HGB 8.1* 8.7* 7.8*  HCT 25.5* 26.3* 24.3*  MCV 101.6* 102.3* 100.8*  PLT 111* 107* 109*   Cardiac Enzymes: No results for input(s): CKTOTAL, CKMB, CKMBINDEX, TROPONINI in the last 168 hours. BNP (last 3 results) No results for input(s): PROBNP in the last 8760 hours. CBG:  Recent Labs Lab 08/30/15 0039  GLUCAP 112*   D-Dimer: No results for input(s): DDIMER in the last 72 hours. Hgb A1c: No results for input(s): HGBA1C in the last 72 hours. Lipid Profile: No results for input(s): CHOL, HDL, LDLCALC, TRIG, CHOLHDL, LDLDIRECT in the last 72 hours. Thyroid function studies: No results for input(s): TSH, T4TOTAL, T3FREE, THYROIDAB in the last 72 hours.  Invalid input(s): FREET3 Anemia work up: No results for input(s): VITAMINB12, FOLATE, FERRITIN, TIBC, IRON, RETICCTPCT  in the last 72 hours. Sepsis Labs:  Recent Labs Lab 08/29/15 1955 08/29/15 2213 08/30/15 0043 08/30/15 0615  WBC 3.7*  --  3.9* 3.5*  LATICACIDVEN  --  0.93  --   --    Microbiology No results found for this or any previous visit (from the past 240 hour(s)).   Medications:   . amiodarone  100 mg Oral Daily  . atorvastatin  80 mg Oral QHS  . furosemide  120 mg Oral BID  . insulin aspart  0-9 Units Subcutaneous TID WC  . potassium chloride  20 mEq Oral q12n4p  . potassium chloride SA  40 mEq Oral Q breakfast  . tamsulosin  0.4 mg Oral QPC supper   Continuous Infusions: . sodium chloride 125 mL/hr at 08/30/15 0051    Time spent: 25 min   LOS: 1 day   Charlynne Cousins  [facility] Pager [phone]  *Please refer to [email or web address], password [email or web address] to get updated schedule on who will round on this patient, as hospitalists switch teams weekly. If 7PM-7AM, please contact night-coverage at [email or web address], password [email or web address] for any overnight needs.  08/30/2015, 8:13 AM

## 2015-08-30 NOTE — Progress Notes (Signed)
Pt was admitted from ED accompanied by a nurse tech per stretcher and with his CT contrast, pt oriented to the unit and equipment, admission parkage given to pt, ID bracelet checked and fall prevention guidelines discuss with him, went down for CT, prescribed treatment started BP dropping down with no medical intervention will continue to monitor

## 2015-08-30 NOTE — Consult Note (Signed)
Subjective:   HPI  [name] is a 80 year old male [name] came to the emergency room last night because of abdominal pain which was related to a nonreducible hernia. [name] kept Pushing his right inguinal hernia in but it kept coming out again and hurting.The  emergency room doctor did a rectal exam. The stool was brown but Hemoccult positive. The emergency room doctor thought the [name] should be admitted. We were asked to consult in regards to heme positive stool and anemia. The [name] has been evaluated in the past by Dr. Oletta Lamas for heme positive stool and anemia. [name] had a normal colonoscopy in 2004. [name] had a normal colonoscopy in 2008. [name] had a normal colonoscopy in 2012. [name] had a normal EGD in 2012. The [name] has not seen any bleeding.hehas a myelodysplastic syndrome  Review of Systems No chest pain or shortness of breath  Past Medical History  Diagnosis Date  . Hypertension   . Hyperlipidemia   . Ulcer   . GERD (gastroesophageal reflux disease)   . DVT (deep venous thrombosis) ([place]) 2011    Right arm  . Atrial fibrillation ([place])   . Thrombocytopenia, unspecified ([place]) 04/24/2013  . Gout   . Depressive disorder   . Internal hemorrhoids without mention of complication   . Gastroparesis   . Osteopenia   . Ischemic colitis ([place])   . Peripheral artery disease ([place])   . Diabetes mellitus type 2 with peripheral artery disease ([place])   . Chronic diastolic heart failure ([place])   . 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 Renal artery stenosis ([place])   . CKD (chronic kidney disease)   . Vitamin B 12 deficiency   . CAD (coronary artery disease)     s/b CABG 1994, and subsequent stents. Repeat CABG 12/2011,  . Prostate cancer ([place]) 1997    XRT and lupron  . Myelodysplastic syndrome ([place]) 05/22/2013    With low hemoglobin and platelets treated with Procrit  . Angiomyolipoma 2009    On both kidneys noted in 2009  . Sick sinus syndrome ([place])   . Hematuria 05/19/2015  . Peptic ulcer     S/p partial  gastrectomy in 1969   Past Surgical History  Procedure Laterality Date  . Hiatal hernia repair      and ulcer repair  . Appendectomy  1991  . Pacemaker insertion  10/18/1994    DDD pacemaker, [employer]. Gen change 12/10/2003.  Marland Kitchen Coronary artery bypass graft  01/22/1993  . Cholecystectomy  Oct 2009    Laparoscopic  . Coronary artery bypass graft  01/03/2012    Procedure: REDO CORONARY ARTERY BYPASS GRAFTING (CABG);  Surgeon: Gaye Pollack, MD;  Location: [facility];  Service: Open Heart Surgery;  Laterality: N/A;  Redo CABG x  using bilateral internal mammary arteries;  left leg greater saphenous vein harvested endoscopically  . Celiac artery angioplasty  05-16-12    and stenting  . Partial gastrectomy  1969    Hx of ulcer s/p partial gastrectomy/ has pernicious anemia  . Other surgical history  02/13/13    superior mesenteric artery angiogram  . Thrombectomy / embolectomy subclavian artery  02/02/10    Right subclavian thromboectomy and venous angioplasty, and chronic mesenteric ischemia with Herculink stenting to superior mesenteric and celiac arteries - Dr. Trula Slade  . Other surgical history  05/16/12    Stent in stomach  . Pacemaker generator change  12/10/2003    SJM Identity XL DR performed by Dr Leonia Reeves  . Visceral  angiogram N/A 05/25/2011    Procedure: VISCERAL ANGIOGRAM;  Surgeon: Serafina Mitchell, MD;  Location: [facility] CATH LAB;  Service: Cardiovascular;  Laterality: N/A;  . Abdominal angiogram N/A 05/25/2011    Procedure: ABDOMINAL ANGIOGRAM;  Surgeon: Serafina Mitchell, MD;  Location: [facility] CATH LAB;  Service: Cardiovascular;  Laterality: N/A;  . Lower extremity angiogram Bilateral 05/25/2011    Procedure: LOWER EXTREMITY ANGIOGRAM;  Surgeon: Serafina Mitchell, MD;  [facility] CATH LAB;  Service: Cardiovascular;  Laterality: Bilateral;  . Left heart catheterization with coronary/graft angiogram  12/24/2011    Procedure: LEFT HEART CATHETERIZATION WITH Beatrix Fetters;  Surgeon: Sinclair Grooms, MD;  Location: [facility] CATH LAB;  Service: Cardiovascular;;  . Visceral angiogram Bilateral 12/28/2011    Procedure: VISCERAL ANGIOGRAM;  Surgeon: Serafina Mitchell, MD;  Location: [facility] CATH LAB;  Service: Cardiovascular;  Laterality: Bilateral;  . Visceral angiogram N/A 05/16/2012    Procedure: VISCERAL ANGIOGRAM;  Surgeon: Serafina Mitchell, MD;  Location[facility] C[facility] LAB;  Service: Cardiovascular;  Laterality: N/A;  . Visceral angiogram N/A 02/13/2013    Procedure: VISCERAL ANGIOGRAM;  Surgeon: Serafina Mitchell, MD;  Location: [facility] CATH LAB;  Service: Cardiovascular;  Laterality: N/A;  . Renal angiogram N/A 02/13/2013    Procedure: RENAL ANGIOGRAM;  Surgeon: Serafina Mitchell, MD;  Location: [facility] CATH LAB;  Service: Cardiovascular;  Laterality: N/A;  . Abdominal angiogram N/A 02/13/2013    Procedure: ABDOMINAL ANGIOGRAM;  Surgeon: Serafina Mitchell, MD;  Location: [facility] CATH LAB;  Service: Cardiovascular;  Laterality: N/A;   Social History   Social History  . Marital Status: Married    Spouse Name: N/A  . Number of Children: N/A  . Years of Education: N/A   Occupational History  . Not on file.   Social History Main Topics  . Smoking status: Former Smoker -- 20 years    Types: Cigarettes    Quit date: 07/12/1969  . Smokeless tobacco: Never Used  . Alcohol Use: No  . Drug Use: No  . Sexual Activity: Yes   Other Topics Concern  . Not on file   Social History Narrative   family history includes CAD in his brother; CVA (age of onset: 3) in his mother; Cancer in his father, paternal uncle, and sister; Diabetes in his mother and sister; Heart attack in his daughter; Heart disease in his brother, daughter, mother, and sister; Hyperlipidemia in his mother; Hypertension in his daughter, father, mother, and sister.  Current facility-administered medications:  .  0.9 %  sodium chloride infusion, , Intravenous, Continuous, Charlynne Cousins, MD, Last Rate: 10 mL/hr at 08/30/15 [id number] .  acetaminophen (TYLENOL)  tablet 650 mg, 650 mg, Oral, Q6H PRN **OR** acetaminophen (TYLENOL) suppository 650 mg, 650 mg, Rectal, Q6H PRN, Edwin Dada, MD .  amiodarone (PACERONE) tablet 100 mg, 100 mg, Oral, Daily, Edwin Dada, MD, 100 mg at 08/30/15 0915 .  atorvastatin (LIPITOR) tablet 80 mg, 80 mg, Oral, QHS, Edwin Dada, MD, 80 mg at 08/30/15 0120 .  HYDROcodone-acetaminophen (NORCO/VICODIN) 5-325 MG per tablet 1-2 tablet, 1-2 tablet, Oral, Q4H PRN, Edwin Dada, MD .  insulin aspart (novoLOG) injection 0-9 Units, 0-9 Units, Subcutaneous, TID WC, Edwin Dada, MD, 0 Units at 08/30/15 0800 .  potassium chloride SA (K-DUR,KLOR-CON) CR tablet 20 mEq, 20 mEq, Oral, q12n4p, Edwin Dada, MD, 20 mEq at 08/30/15 1213 .  potassium chloride SA (K-DUR,KLOR-CON) CR tablet 40 mEq, 40 mEq, Oral, Q breakfast,  Edwin Dada, MD, 40 mEq at 08/30/15 1000 .  tamsulosin (FLOMAX) capsule 0.4 mg, 0.4 mg, Oral, QPC supper, Edwin Dada, MD Allergies  Allergen Reactions  . Nsaids Other (See Comments)    GI issue  . Ibuprofen Other (See Comments)    GI Issues  . Ace Inhibitors Cough     Objective:     BP 144/58 mmHg  Pulse 58  Temp(Src) 98.3 F (36.8 C) (Oral)  Resp 20  Ht 5\' 10"  (1.778 m)  Wt 73.2 kg (161 lb 6 oz)  BMI 23.16 kg/m2  SpO2 97%  [name] is in no distress  Nonicteric  Heart regular rhythm no murmurs  Lungs clear  Abdomen is soft and nontender  Laboratory No components found for: D1    Assessment:     Heme positive stool. No signs of significant active bleeding. [name] is in no distress.      Plan:   I think [name] can go home and followup with Dr. Oletta Lamas as an outpatient. [name] has had extensive prior evaluation for heme positive stool. [name] should see his surgeon in regards to his inguinal hernia.

## 2015-08-31 DIAGNOSIS — Z515 Encounter for palliative care: Secondary | ICD-10-CM

## 2015-08-31 DIAGNOSIS — Z7189 Other specified counseling: Secondary | ICD-10-CM

## 2015-08-31 DIAGNOSIS — K921 Melena: Secondary | ICD-10-CM

## 2015-08-31 DIAGNOSIS — D469 Myelodysplastic syndrome, unspecified: Secondary | ICD-10-CM

## 2015-08-31 LAB — GLUCOSE, CAPILLARY
GLUCOSE-CAPILLARY: 107 mg/dL — AB (ref 65–99)
Glucose-Capillary: 188 mg/dL — ABNORMAL HIGH (ref 65–99)

## 2015-08-31 MED ORDER — HYDROCODONE-ACETAMINOPHEN 5-325 MG PO TABS: 1.0000 | ORAL_TABLET | ORAL | Status: DC | PRN

## 2015-08-31 NOTE — Progress Notes (Signed)
Nsg Discharge Note  Admit Date:  08/29/2015 Discharge date: 08/31/2015   [name] to be D/C'd home per MD order.  AVS completed.  Copy for chart, and copy for patient signed, and dated. Patient/caregiver able to verbalize understanding.  Discharge Medication:   Medication List    STOP taking these medications        amLODipine 10 MG tablet  Commonly known as:  NORVASC      TAKE these medications        amiodarone 200 MG tablet  Commonly known as:  PACERONE  Take one-half tablet by  mouth daily     ARTHRITIS PAIN RELIEF 650 MG CR tablet  Generic drug:  acetaminophen  Take 1,300 mg by mouth See admin instructions. Take 2 tablets (1300 mg) by mouth twice daily, may also take 2 tablets (1300 mg) mid day as needed for arthritis pain     aspirin 81 MG tablet  Take 1 tablet (81 mg total) by mouth daily.     atorvastatin 80 MG tablet  Commonly known as:  LIPITOR  Take 80 mg by mouth at bedtime.     CALCIUM 600 + D 600-200 MG-UNIT Tabs  Generic drug:  Calcium Carb-Cholecalciferol  Take 1-2 tablets by mouth 2 (two) times daily. Take 2 tablets by mouth every morning and 1 tablet at night     carvedilol 25 MG tablet  Commonly known as:  COREG  TAKE 1 TABLET BY MOUTH  TWICE DAILY WITH MEAL     cholecalciferol 1000 units tablet  Commonly known as:  VITAMIN D  Take 1,000 Units by mouth 2 (two) times daily.     cloNIDine 0.1 MG tablet  Commonly known as:  CATAPRES  Take 0.1 mg by mouth 2 (two) times daily.     clopidogrel 75 MG tablet  Commonly known as:  PLAVIX  Take 1 tablet by mouth  daily     cyanocobalamin 1000 MCG/ML injection  Commonly known as:  (VITAMIN B-12)  Inject 1,000 mcg into the muscle every 30 (thirty) days. Vitamin B12 - last injection 07/24/15     epoetin alfa 10000 UNIT/ML injection  Commonly known as:  EPOGEN,PROCRIT  Inject 10,000 Units into the skin every 21 ( twenty-one) days. Done at [facility] cancer center - last injection December 2016     furosemide 80 MG tablet  Commonly known as:  LASIX  TAKE 1 AND 1/2 TABLETS BY  MOUTH TWICE DAILY     hydrALAZINE 50 MG tablet  Commonly known as:  APRESOLINE  Take 1 tablet (50 mg total) by mouth 3 (three) times daily.     HYDROcodone-acetaminophen 5-325 MG tablet  Commonly known as:  NORCO/VICODIN  Take 1-2 tablets by mouth every 4 (four) hours as needed for moderate pain.     isosorbide mononitrate 120 MG 24 hr tablet  Commonly known as:  IMDUR  Take 1 tablet by mouth  daily     LUPRON IJ  Inject 1 application as directed every 4 (four) months. Done at urologist's office - last inject December 2016     potassium chloride SA 20 MEQ tablet  Commonly known as:  K-DUR,KLOR-CON  Take 20-40 mEq by mouth 3 (three) times daily. Take 2 tablets (40 meq) by mouth with breakfast, take 1 tablet (20 meq) with lunch and supper     silodosin 8 MG Caps capsule  Commonly known as:  RAPAFLO  Take 8 mg by mouth at bedtime.  Discharge Assessment: Filed Vitals:   08/30/15 2153 08/31/15 0606  BP: 161/60 178/63  Pulse: 56 64  Temp: 98.3 F (36.8 C) 98.9 F (37.2 C)  Resp: 18 18   Skin clean, dry and intact without evidence of skin break down, no evidence of skin tears noted. IV catheter discontinued catheter tip intact. Site without signs and symptoms of complications - no redness or edema noted at insertion site, patient denies c/o pain - only slight tenderness at site.  Dressing with slight pressure applied.  D/c Instructions-Education: Discharge instructions given to patient/family with verbalized understanding. D/c education completed with patient/family including follow up instructions, medication list, d/c activities limitations if indicated, with other d/c instructions as indicated by MD - patient able to verbalize understanding, all questions fully answered. Patient instructed to return to ED, call 911, or call MD for any changes in condition.  Patient in room getting dressed at  this time with help of family members. RN informed pt to call RN once he is ready for his wheelchair to take him to main entrance.   Dorita Fray, RN 08/31/2015 1:54 PM

## 2015-08-31 NOTE — Progress Notes (Signed)
Utilization Review Completed.[name] T2/19/2017  

## 2015-08-31 NOTE — Progress Notes (Signed)
[facility] Progress Note  Subjective: The patient states that he feels fine. He has not had any signs of any active GI bleeding. No hemoglobin reported today. He is eating. He is not dizzy when he gets up.  Objective: Vital signs in last 24 hours: Temp:  [98 F (36.7 C)-98.9 F (37.2 C)] 98.9 F (37.2 C) (02/19 0606) Pulse Rate:  [56-65] 64 (02/19 0606) Resp:  [18] 18 (02/19 0606) BP: (161-180)/(60-66) 178/63 mmHg (02/19 0606) SpO2:  [96 %-99 %] 97 % (02/19 0606) Weight change:    PE:  No distress  Heart regular rhythm  Lungs clear  Abdomen soft and nontender  Lab Results: Results for orders placed or performed during the hospital encounter of 08/29/15 (from the past 24 hour(s))  Glucose, capillary     Status: None   Collection Time: 08/30/15 12:02 PM  Result Value Ref Range   Glucose-Capillary 86 65 - 99 mg/dL  Glucose, capillary     Status: Abnormal   Collection Time: 08/30/15  4:44 PM  Result Value Ref Range   Glucose-Capillary 138 (H) 65 - 99 mg/dL  Glucose, capillary     Status: Abnormal   Collection Time: 08/30/15  9:51 PM  Result Value Ref Range   Glucose-Capillary 116 (H) 65 - 99 mg/dL   Comment 1 Notify RN    Comment 2 Document in Chart   Glucose, capillary     Status: Abnormal   Collection Time: 08/31/15  8:08 AM  Result Value Ref Range   Glucose-Capillary 107 (H) 65 - 99 mg/dL    Studies/Results: No results found.    Assessment: Heme positive stool  Anemia  Myelodysplastic disorder  Plan:   He has been evaluated in the past for heme positive stool. He does not appear to be having any significant GI bleeding. I think he can go home from a GI standpoint and follow-up with Dr. Oletta Lamas his primary gastroenterologist, in his PCP.    Cassell Clement 08/31/2015, 9:38 AM  Pager: [phone] If no answer or after 5 PM call [phone]

## 2015-08-31 NOTE — Discharge Summary (Signed)
Physician Discharge Summary  [name] [id number] DOB: July 08, 1936 DOA: 08/29/2015  PCP: [name], MD  Admit date: 08/29/2015 Discharge date: 08/31/2015  Time spent: 35 minutes  Recommendations for Outpatient Follow-up:  1. General surgery as an outpatient in 2 weeks. 2. Follow-up with gastroenterology in 2-4 weeks outpatient.    Discharge Diagnoses:  Principal Problem:   GIB (gastrointestinal bleeding) Active Problems:   Chronic vascular insufficiency of intestine (HCC)   PAF (paroxysmal atrial fibrillation) (HCC)   PAD (peripheral artery disease) (HCC)   Mesenteric artery stenosis (HCC)   Chronic diastolic heart failure (HCC)   Diabetes mellitus ([place])   Essential hypertension   CKD (chronic kidney disease), stage IV (HCC)   MDS (myelodysplastic syndrome) (HCC)   Sick sinus syndrome ([place])   Pacemaker   Inguinal hernia   Discharge Condition: stable  Diet recommendation: regular  Filed Weights   08/29/15 1928 08/30/15 0038  Weight: 74.39 kg (164 lb) 73.2 kg (161 lb 6 oz)    History of present illness:   HPI: [name]. is a 80 y.o. male with a past medical history significant for HFpef, myelodysplastic syndrome, pAF with pacer, CKD IV, and history of mesenteric artery stenting who presents with abdominal pain.  The patient has had about 2 weeks of intermittent crampy right lower quadrant pain associated with a new bulge in his right groin. He was seen in urgent care a week ago, diagnosed with right inguinal hernia, shown how to reduce the hernia and sent home. Yesterday he saw his PCP, who confirmed hernia, initiated referral to general surgery, and recommended that the patient seek urgent medical attention if he ever had pain with inability to reduce the hernia. Unfortunately, this afternoon the patient felt a bulge again, laid down and was able to reduce the hernia, but continued to have cramping right lower quadrant pain, and so came to the  ER  Hospital Course:  Lower GIB (gastrointestinal bleeding)/melena: He denies any recent NSAID use. He has been getting erythropoietin by Dr. core session with good response. CT scan of the abdomen and pelvis did not show an incarcerated hernia. There's no incarcerated hernia by physical exam. Consulted GI recommendations follow-up with his primary gastric urologist as an outpatient. His hemoglobin and stable.  Abdominal pain/ Chronic vascular insufficiency of intestine [facility]): His hernia is reducible CT scan shows that is not incarcerated, he has had no fever or leukocytosis. Unlikely an episode of ischemic colitis although he has a history of superior mesenteric stenting 8 2014 Lactic acid was 0.9, LDH 193. Abdominal pain likely due to inguinal hernia.  Chronic diastolic heart failure and sick sinus rhythm with paroxysmal atrial fibrillation: Not on warfarin at baseline. CHADS2Vasc 5. Not a candidate for Coumadin.  Diabetes mellitus ([place]) No changes were made to his medication.  Essential hypertension: Continue regimen no changes were made to his medication.  CKD (chronic kidney disease), stage IV [facility]): Reading were held on admission, no changes were made recently was an outpatient. Creatinine seems to be at baseline 2.3-2.7.  MDS (myelodysplastic syndrome) ([place])  Inguinal hernia: Follow up with Gen. surgery as an outpatient. I cannot appreciate any hernia even with coughing. CT scan did not show an incarcerated hernia, no leukocytosis fever lactic acid < 2.    Procedures:  CT abd and pelvis  Consultations:  GI  Discharge Exam: Filed Vitals:   08/30/15 2153 08/31/15 0606  BP: 161/60 178/63  Pulse: 56 64  Temp: 98.3 F (36.8  C) 98.9 F (37.2 C)  Resp: 18 18    General: A&O x3 Cardiovascular: RRR Respiratory: good air movement CTA B/L  Discharge Instructions   Discharge Instructions    Diet - low sodium heart healthy    Complete by:  As directed       Increase activity slowly    Complete by:  As directed           Current Discharge Medication List    START taking these medications   Details  HYDROcodone-acetaminophen (NORCO/VICODIN) 5-325 MG tablet Take 1-2 tablets by mouth every 4 (four) hours as needed for moderate pain. Qty: 30 tablet, Refills: 0      CONTINUE these medications which have NOT CHANGED   Details  acetaminophen (ARTHRITIS PAIN RELIEF) 650 MG CR tablet Take 1,300 mg by mouth See admin instructions. Take 2 tablets (1300 mg) by mouth twice daily, may also take 2 tablets (1300 mg) mid day as needed for arthritis pain    amiodarone (PACERONE) 200 MG tablet Take one-half tablet by  mouth daily Qty: 45 tablet, Refills: 3    aspirin 81 MG tablet Take 1 tablet (81 mg total) by mouth daily.    atorvastatin (LIPITOR) 80 MG tablet Take 80 mg by mouth at bedtime.     Calcium Carb-Cholecalciferol (CALCIUM 600 + D) 600-200 MG-UNIT TABS Take 1-2 tablets by mouth 2 (two) times daily. Take 2 tablets by mouth every morning and 1 tablet at night    carvedilol (COREG) 25 MG tablet TAKE 1 TABLET BY MOUTH  TWICE DAILY WITH MEAL Qty: 180 tablet, Refills: 3    cholecalciferol (VITAMIN D) 1000 UNITS tablet Take 1,000 Units by mouth 2 (two) times daily.     cloNIDine (CATAPRES) 0.1 MG tablet Take 0.1 mg by mouth 2 (two) times daily.    clopidogrel (PLAVIX) 75 MG tablet Take 1 tablet by mouth  daily Qty: 90 tablet, Refills: 3    cyanocobalamin (,VITAMIN B-12,) 1000 MCG/ML injection Inject 1,000 mcg into the muscle every 30 (thirty) days. Vitamin B12 - last injection 07/24/15    furosemide (LASIX) 80 MG tablet TAKE 1 AND 1/2 TABLETS BY  MOUTH TWICE DAILY Qty: 270 tablet, Refills: 3    hydrALAZINE (APRESOLINE) 50 MG tablet Take 1 tablet (50 mg total) by mouth 3 (three) times daily. Qty: 90 tablet, Refills: 3    isosorbide mononitrate (IMDUR) 120 MG 24 hr tablet Take 1 tablet by mouth  daily Qty: 90 tablet, Refills: 3    Leuprolide  Acetate (LUPRON IJ) Inject 1 application as directed every 4 (four) months. Done at urologist's office - last inject December 2016    potassium chloride SA (K-DUR,KLOR-CON) 20 MEQ tablet Take 20-40 mEq by mouth 3 (three) times daily. Take 2 tablets (40 meq) by mouth with breakfast, take 1 tablet (20 meq) with lunch and supper   Associated Diagnoses: MDS (myelodysplastic syndrome) (HCC)    silodosin (RAPAFLO) 8 MG CAPS capsule Take 8 mg by mouth at bedtime.     epoetin alfa (EPOGEN,PROCRIT) 60454 UNIT/ML injection Inject 10,000 Units into the skin every 21 ( twenty-one) days. Done at [facility] center - last injection December 2016      STOP taking these medications     amLODipine (NORVASC) 10 MG tablet        Allergies  Allergen Reactions  . Nsaids Other (See Comments)    GI issue  . Ibuprofen Other (See Comments)    GI Issues  . Ace  Inhibitors Cough   Follow-up Information    Follow up with [facility], MD In 2 weeks.   Specialty:  General Surgery   Contact information:   [address] [place] [address] [phone]       Follow up with [name], MD In 2 weeks.   Specialty:  Family Medicine   Contact information:   [place] [address] [phone]        The results of significant diagnostics from this hospitalization (including imaging, microbiology, ancillary and laboratory) are listed below for reference.    Significant Diagnostic Studies: Ct Abdomen Pelvis Wo Contrast  08/30/2015  CLINICAL DATA:  Mid to lower abdominal pain for 1 week. History of hiatal hernia repair, cholecystectomy, appendectomy. EXAM: CT ABDOMEN AND PELVIS WITHOUT CONTRAST TECHNIQUE: Multidetector CT imaging of the abdomen and pelvis was performed following the standard protocol without IV contrast. COMPARISON:  04/03/2014 FINDINGS: Areas of atelectasis in the lung bases. Cardiac pacemaker leads. Coronary artery calcifications. Postoperative  changes in the mediastinum. Evaluation of solid organs and vascular structures is limited without IV contrast material. Multiple circumscribed low-attenuation lesions are demonstrated in the liver, largest measuring about 2.7 cm. These have been present on the previous study and likely represent cysts. Gallbladder is surgically absent. No bile duct dilatation. Unenhanced appearance of the pancreas, spleen, adrenal glands, kidneys, inferior vena cava, and retroperitoneal lymph nodes is unremarkable. Prominent diffuse calcification of the abdominal aorta and major branch vessels. Cannot exclude calcific stenosis of the celiac axis, superior mesenteric artery, bilateral renal arteries, and bilateral iliac and external iliac arteries. Stomach, small bowel, and colon are not abnormally distended. Stool fills the colon. No free air or free fluid in the abdomen. Pelvis: Bladder is distended without wall thickening. Prostate gland is enlarged at 5.2 cm diameter. No free or loculated pelvic fluid collections. No pelvic mass or lymphadenopathy. The appendix is not identified. Stool-filled rectosigmoid colon. No evidence of diverticulitis. Mild degenerative changes in the spine. No destructive bone lesions. Small right inguinal hernia containing fat. IMPRESSION: No definite acute process demonstrated in the abdomen or pelvis. Atelectasis in the lung bases. Probable hepatic cysts. Prostate enlargement. Extensive vascular calcifications. Likely there is calcific stenosis of multiple vessels. Electronically Signed   By: Lucienne Capers M.D.   On: 08/30/2015 02:19    Microbiology: No results found for this or any previous visit (from the past 240 hour(s)).   Labs: Basic Metabolic Panel:  Recent Labs Lab 08/29/15 1955 08/30/15 0329  NA 142 141  K 4.1 3.1*  CL 105 106  CO2 26 26  GLUCOSE 197* 114*  BUN 36* 32*  CREATININE 2.71* 2.39*  CALCIUM 9.7 9.1   Liver Function Tests:  Recent Labs Lab 08/29/15 1955   AST 20  ALT 9*  ALKPHOS 38  BILITOT 0.4  PROT 7.3  ALBUMIN 4.3   No results for input(s): LIPASE, AMYLASE in the last 168 hours. No results for input(s): AMMONIA in the last 168 hours. CBC:  Recent Labs Lab 08/29/15 1955 08/30/15 0043 08/30/15 0615  WBC 3.7* 3.9* 3.5*  HGB 8.1* 8.7* 7.8*  HCT 25.5* 26.3* 24.3*  MCV 101.6* 102.3* 100.8*  PLT 111* 107* 109*   Cardiac Enzymes: No results for input(s): CKTOTAL, CKMB, CKMBINDEX, TROPONINI in the last 168 hours. BNP: BNP (last 3 results) No results for input(s): BNP in the last 8760 hours.  ProBNP (last 3 results) No results for input(s): PROBNP in the last 8760  hours.  CBG:  Recent Labs Lab 08/30/15 0835 08/30/15 1202 08/30/15 1644 08/30/15 2151 08/31/15 0808  GLUCAP 81 86 138* 116* 107*     Signed:  Charlynne Cousins MD.  [facility] 08/31/2015, 11:55 AM

## 2015-08-31 NOTE — Consult Note (Signed)
Consultation Note Date: 09/01/2015   Patient Name: [name]  DOB: 04-08-1936  MRN: [id number]  Age / Sex: 80 y.o., male  PCP: Hulan Fess, MD Referring Physician: No att. providers found  Reason for Consultation: Establishing goals of care  Clinical Assessment/Narrative: Raidon Goebel. is a 80 y.o. male with a past medical history significant for HFpef, myelodysplastic syndrome, pAF with pacer, CKD IV, and history of mesenteric artery stenting admitted with abdominal pain.  The patient has had about 2 weeks of intermittent crampy right lower quadrant pain associated with a new bulge in his right groin. He was seen in urgent care a week ago, diagnosed with right inguinal hernia, shown how to reduce the hernia and sent home. Yesterday he saw his PCP, who confirmed hernia, initiated referral to general surgery, and recommended that the patient seek urgent medical attention if he ever had pain with inability to reduce the hernia. Unfortunately, this afternoon the patient felt a bulge again, laid down and was able to reduce the hernia, but continued to have cramping right lower quadrant pain, and so came to the ER.  This has improved.  Additionally, he developed lower GI bleed.  He was evaluated by GI and he reports being told that he can f/u as OP.  He was getting shots every 3 weeks to maintain his blood counts, but has not been for the last couple of months due to financial concerns.  He reports that this will not be an issue moving forward.   Contacts/Participants in Discussion: patient  SUMMARY OF RECOMMENDATIONS  We reviewed a MOST form and discussed how to develop plan of care to focus on continuing therapies that would maximize chance of being well enough to return home and limiting therapies not in line with this goal.  He did not want to complete one at this time. We discussed that the hospital can  be useful as long as he is getting well enough from care he receives at the hospital to enjoy his time at home, but there may come a time where, if his goal is to be at home, he may be better served to plan on being at home and bringing care to him at home rather repeated trips to the hospital.  I encouraged him to keep in conversation with his outpatient doctors to help determine where he is at in this process.  Code Status/Advance Care Planning: Full code Code Status History    Date Active Date Inactive Code Status Order ID Comments User Context   08/30/2015 12:33 AM 08/31/2015  5:14 PM Full Code [id number]  Edwin Dada, MD Inpatient   06/05/2013  4:19 AM 06/14/2013  5:41 PM Full Code [id number]  Charlton Haws, MD Inpatient   03/16/2013  9:54 PM 03/20/2013  6:18 PM Full Code [id number]  Theressa Millard, MD ED   01/06/2012 11:43 AM 01/09/2012  2:51 PM Full Code [id number]  Orvan July, RN Inpatient     Palliative Prophylaxis:   Aspiration, Delirium Protocol and Frequent Pain Assessment  Psycho-social/Spiritual:  Support System: [place] Desire for further Chaplaincy support:No Additional Recommendations: Caregiving  Support/Resources  Prognosis: Unable to determine  Discharge Planning: Likely home   Chief Complaint/ Primary Diagnoses: Present on Admission:  . GIB (gastrointestinal bleeding) . Chronic vascular insufficiency of intestine (HCC) . PAF (paroxysmal atrial fibrillation) ([place]) . PAD (peripheral artery disease) ([place]) . Mesenteric artery stenosis ([place]) . Chronic diastolic heart failure ([place]) . Essential hypertension . CKD (chronic  kidney disease), stage IV ([place]) . MDS (myelodysplastic syndrome) ([place]) . Sick sinus syndrome ([place]) . Pacemaker  I have reviewed the medical record, interviewed the patient and family, and examined the patient. The following aspects are pertinent.  Past Medical History  Diagnosis Date  . Hypertension   . Hyperlipidemia   . Ulcer   . GERD  (gastroesophageal reflux disease)   . DVT (deep venous thrombosis) ([place]) 2011    Right arm  . Atrial fibrillation ([place])   . Thrombocytopenia, unspecified ([place]) 04/24/2013  . Gout   . Depressive disorder   . Internal hemorrhoids without mention of complication   . Gastroparesis   . Osteopenia   . Ischemic colitis ([place])   . Peripheral artery disease ([place])   . Diabetes mellitus type 2 with peripheral artery disease ([place])   . Chronic diastolic heart failure ([place])   . 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 Renal artery stenosis ([place])   . CKD (chronic kidney disease)   . Vitamin B 12 deficiency   . CAD (coronary artery disease)     s/b CABG 1994, and subsequent stents. Repeat CABG 12/2011,  . Prostate cancer ([place]) 1997    XRT and lupron  . Myelodysplastic syndrome ([place]) 05/22/2013    With low hemoglobin and platelets treated with Procrit  . Angiomyolipoma 2009    On both kidneys noted in 2009  . Sick sinus syndrome ([place])   . Hematuria 05/19/2015  . Peptic ulcer     S/p partial gastrectomy in [place] History  . Marital Status: Married    Spouse Name: N/A  . Number of Children: N/A  . Years of Education: N/A   Social History Main Topics  . Smoking status: Former Smoker -- 20 years    Types: Cigarettes    Quit date: 07/12/1969  . Smokeless tobacco: Never Used  . Alcohol Use: No  . Drug Use: No  . Sexual Activity: Yes   Other Topics Concern  . None   Social History Narrative   Family History  Problem Relation Age of Onset  . Diabetes Mother   . Heart disease Mother     Heart Disease before age 79  . Hyperlipidemia Mother   . Hypertension Mother   . CVA Mother 75    cause of death  . Cancer Father     stomach/liver  . Hypertension Father     possibly hypertensive  . Cancer Sister     Breast cancer  . Heart disease Daughter     Heart Disease before age 56  . Hypertension Daughter   . Heart attack Daughter   . CAD Brother   . Heart disease Brother    . Cancer Paternal Uncle     colon  . Heart disease Sister   . Diabetes Sister   . Hypertension Sister    Scheduled Meds: Continuous Infusions: PRN Meds:. Medications Prior to Admission:  Prior to Admission medications   Medication Sig Start Date End Date Taking? Authorizing Provider  acetaminophen (ARTHRITIS PAIN RELIEF) 650 MG CR tablet Take 1,300 mg by mouth See admin instructions. Take 2 tablets (1300 mg) by mouth twice daily, may also take 2 tablets (1300 mg) mid day as needed for arthritis pain   Yes Historical Provider, MD  amiodarone (PACERONE) 200 MG tablet Take one-half tablet by  mouth daily 12/02/14  Yes Belva Crome, MD  aspirin 81 MG tablet Take 1 tablet (81 mg total) by  mouth daily. 09/10/14  Yes Belva Crome, MD  atorvastatin (LIPITOR) 80 MG tablet Take 80 mg by mouth at bedtime.    Yes Historical Provider, MD  Calcium Carb-Cholecalciferol (CALCIUM 600 + D) 600-200 MG-UNIT TABS Take 1-2 tablets by mouth 2 (two) times daily. Take 2 tablets by mouth every morning and 1 tablet at night   Yes Historical Provider, MD  carvedilol (COREG) 25 MG tablet TAKE 1 TABLET BY MOUTH  TWICE DAILY WITH MEAL 12/02/14  Yes Belva Crome, MD  cholecalciferol (VITAMIN D) 1000 UNITS tablet Take 1,000 Units by mouth 2 (two) times daily.    Yes Historical Provider, MD  cloNIDine (CATAPRES) 0.1 MG tablet Take 0.1 mg by mouth 2 (two) times daily. 02/19/15  Yes Historical Provider, MD  clopidogrel (PLAVIX) 75 MG tablet Take 1 tablet by mouth  daily 12/02/14  Yes Belva Crome, MD  cyanocobalamin (,VITAMIN B-12,) 1000 MCG/ML injection Inject 1,000 mcg into the muscle every 30 (thirty) days. Vitamin B12 - last injection 07/24/15   Yes Historical Provider, MD  furosemide (LASIX) 80 MG tablet TAKE 1 AND 1/2 TABLETS BY  MOUTH TWICE DAILY Patient taking differently: Take 120 mg by mouth 2 (two) times daily.  05/22/15  Yes Belva Crome, MD  hydrALAZINE (APRESOLINE) 50 MG tablet Take 1 tablet (50 mg total) by  mouth 3 (three) times daily. 11/23/13  Yes Belva Crome, MD  isosorbide mononitrate (IMDUR) 120 MG 24 hr tablet Take 1 tablet by mouth  daily 12/02/14  Yes Belva Crome, MD  Leuprolide Acetate (LUPRON IJ) Inject 1 application as directed every 4 (four) months. Done at urologist's office - last inject December 2016   Yes Historical Provider, MD  potassium chloride SA (K-DUR,KLOR-CON) 20 MEQ tablet Take 20-40 mEq by mouth 3 (three) times daily. Take 2 tablets (40 meq) by mouth with breakfast, take 1 tablet (20 meq) with lunch and supper 06/21/13  Yes Belva Crome, MD  silodosin (RAPAFLO) 8 MG CAPS capsule Take 8 mg by mouth at bedtime.    Yes Historical Provider, MD  epoetin alfa (EPOGEN,PROCRIT) 60454 UNIT/ML injection Inject 10,000 Units into the skin every 21 ( twenty-one) days. Done at [facility] center - last injection December 2016    Historical Provider, MD  HYDROcodone-acetaminophen (NORCO/VICODIN) 5-325 MG tablet Take 1-2 tablets by mouth every 4 (four) hours as needed for moderate pain. 08/31/15   Charlynne Cousins, MD   Allergies  Allergen Reactions  . Nsaids Other (See Comments)    GI issue  . Ibuprofen Other (See Comments)    GI Issues  . Ace Inhibitors Cough    Review of Systems  Abdominal pain, dark stool, fatigue, otherwise 10 point review of systems negative  Physical Exam  General: Alert, awake, in no acute distress.  HEENT: No bruits, no goiter, no JVD Heart: Regular rate and rhythm. No murmur appreciated. Lungs: Good air movement, clear Abdomen: Soft, nontender, nondistended, positive bowel sounds.  Ext: No significant edema Skin: Warm and dry Neuro: Grossly intact, nonfocal.  Vital Signs: BP 178/63 mmHg  Pulse 64  Temp(Src) 98.9 F (37.2 C) (Oral)  Resp 18  Ht 5\' 10"  (1.778 m)  Wt 73.2 kg (161 lb 6 oz)  BMI 23.16 kg/m2  SpO2 97%  SpO2: SpO2: 97 % O2 Device:SpO2: 97 % O2 Flow Rate: .   IO: Intake/output summary:  Intake/Output Summary (Last 24 hours)  at 09/01/15 0750 Last data filed at 08/31/15 1244  Gross  per 24 hour  Intake    120 ml  Output    350 ml  Net   -230 ml    LBM: Last BM Date: 08/30/15 Baseline Weight: Weight: 74.39 kg (164 lb) Most recent weight: Weight: 73.2 kg (161 lb 6 oz)      Palliative Assessment/Data:  Flowsheet Rows        Most Recent Value   Intake Tab    Referral Department  Hospitalist   Unit at Time of Referral  Med/Surg Unit   Palliative Care Primary Diagnosis  Cancer   Date Notified  08/30/15   Palliative Care Type  New Palliative care   Reason for referral  Clarify Goals of Care   Date of Admission  08/29/15   Date first seen by Palliative Care  08/31/15   # of days Palliative referral response time  1 Day(s)   # of days IP prior to Palliative referral  1   Clinical Assessment    Palliative Performance Scale Score  60%   Pain Max last 24 hours  3   Pain Min Last 24 hours  0   Psychosocial & Spiritual Assessment    Palliative Care Outcomes    Patient/Family meeting held?  Yes   Who was at the meeting?  patient   Palliative Care Outcomes  ACP counseling assistance      Additional Data Reviewed:  CBC:    Component Value Date/Time   WBC 3.5* 08/30/2015 0615   WBC 3.0* 06/09/2015 1008   HGB 7.8* 08/30/2015 0615   HGB 10.2* 06/09/2015 1008   HCT 24.3* 08/30/2015 0615   HCT 32.2* 06/09/2015 1008   PLT 109* 08/30/2015 0615   PLT 89* 06/09/2015 1008   MCV 100.8* 08/30/2015 0615   MCV 103.2* 06/09/2015 1008   NEUTROABS 1.2* 06/09/2015 1008   NEUTROABS 4.0 04/03/2014 1830   LYMPHSABS 1.2 06/09/2015 1008   LYMPHSABS 1.3 04/03/2014 1830   MONOABS 0.3 06/09/2015 1008   MONOABS 0.6 04/03/2014 1830   EOSABS 0.3 06/09/2015 1008   EOSABS 0.3 04/03/2014 1830   BASOSABS 0.0 06/09/2015 1008   BASOSABS 0.1 04/03/2014 1830   Comprehensive Metabolic Panel:    Component Value Date/Time   NA 141 08/30/2015 0329   NA 146* 08/10/2013 1324   K 3.1* 08/30/2015 0329   K 3.9 08/10/2013 1324   CL  106 08/30/2015 0329   CO2 26 08/30/2015 0329   CO2 27 08/10/2013 1324   BUN 32* 08/30/2015 0329   BUN 40.3* 08/10/2013 1324   CREATININE 2.39* 08/30/2015 0329   CREATININE 2.6* 08/10/2013 1324   GLUCOSE 114* 08/30/2015 0329   GLUCOSE 105 08/10/2013 1324   CALCIUM 9.1 08/30/2015 0329   CALCIUM 9.4 08/10/2013 1324   AST 20 08/29/2015 1955   AST 14 08/10/2013 1324   ALT 9* 08/29/2015 1955   ALT 8 08/10/2013 1324   ALKPHOS 38 08/29/2015 1955   ALKPHOS 47 08/10/2013 1324   BILITOT 0.4 08/29/2015 1955   BILITOT 0.33 08/10/2013 1324   PROT 7.3 08/29/2015 1955   PROT 7.0 08/10/2013 1324   ALBUMIN 4.3 08/29/2015 1955   ALBUMIN 3.8 08/10/2013 1324     Time In: 0730 Time Out: 0825 Time Total: 55 Greater than 50%  of this time was spent counseling and coordinating care related to the above assessment and plan.  Signed by: Micheline Rough, MD  Micheline Rough, MD  09/01/2015, 7:50 AM  Please contact Palliative Medicine Team phone at [phone] for questions  and concerns.

## 2015-09-01 ENCOUNTER — Other Ambulatory Visit: Payer: Medicare Other

## 2015-09-01 ENCOUNTER — Telehealth: Payer: Self-pay | Admitting: *Deleted

## 2015-09-01 ENCOUNTER — Telehealth: Payer: Self-pay | Admitting: Hematology and Oncology

## 2015-09-01 ENCOUNTER — Ambulatory Visit: Payer: Medicare Other

## 2015-09-01 ENCOUNTER — Other Ambulatory Visit: Payer: Self-pay | Admitting: Hematology and Oncology

## 2015-09-01 DIAGNOSIS — D469 Myelodysplastic syndrome, unspecified: Secondary | ICD-10-CM

## 2015-09-01 LAB — HEMOGLOBIN A1C
HEMOGLOBIN A1C: 6.5 % — AB (ref 4.8–5.6)
MEAN PLASMA GLUCOSE: 140 mg/dL

## 2015-09-01 NOTE — Telephone Encounter (Signed)
I placed POF for labs tomorrow and see me next week Earliest we can start Aranesp is next week but he needs labs drawn tomorrow

## 2015-09-01 NOTE — Telephone Encounter (Signed)
Spoke to pt and his wife to confirm lab appt for tomorrow per 2/20 pof. Gave date/time for 2/27 visit as well

## 2015-09-01 NOTE — Telephone Encounter (Signed)
Wife Danton Clap "calling to schedule appointment.  He cancelled in December because the bills were so high.  He's caught them up now.  Went to [facility] ED Friday with Hernia and his hgb is low.  He needs his injections and to be seen.  No transfusion received but he is sick."  Denies chest pain or shortness of breath.  Will notify Dr. Alvy Bimler.

## 2015-09-02 ENCOUNTER — Other Ambulatory Visit (HOSPITAL_BASED_OUTPATIENT_CLINIC_OR_DEPARTMENT_OTHER): Payer: Medicare Other

## 2015-09-02 DIAGNOSIS — D469 Myelodysplastic syndrome, unspecified: Secondary | ICD-10-CM

## 2015-09-02 LAB — CBC & DIFF AND RETIC
BASO%: 0.9 % (ref 0.0–2.0)
Basophils Absolute: 0 10*3/uL (ref 0.0–0.1)
EOS ABS: 0.3 10*3/uL (ref 0.0–0.5)
EOS%: 6.4 % (ref 0.0–7.0)
HCT: 24.3 % — ABNORMAL LOW (ref 38.4–49.9)
HGB: 7.8 g/dL — ABNORMAL LOW (ref 13.0–17.1)
IMMATURE RETIC FRACT: 5.2 % (ref 3.00–10.60)
LYMPH%: 29.1 % (ref 14.0–49.0)
MCH: 32.4 pg (ref 27.2–33.4)
MCHC: 32.1 g/dL (ref 32.0–36.0)
MCV: 100.8 fL — ABNORMAL HIGH (ref 79.3–98.0)
MONO#: 0.3 10*3/uL (ref 0.1–0.9)
MONO%: 7.3 % (ref 0.0–14.0)
NEUT%: 56.3 % (ref 39.0–75.0)
NEUTROS ABS: 2.6 10*3/uL (ref 1.5–6.5)
NRBC: 1 % — AB (ref 0–0)
Platelets: 117 10*3/uL — ABNORMAL LOW (ref 140–400)
RBC: 2.41 10*6/uL — AB (ref 4.20–5.82)
RDW: 16.2 % — AB (ref 11.0–14.6)
RETIC CT ABS: 29.4 10*3/uL — AB (ref 34.80–93.90)
Retic %: 1.22 % (ref 0.80–1.80)
WBC: 4.5 10*3/uL (ref 4.0–10.3)
lymph#: 1.3 10*3/uL (ref 0.9–3.3)

## 2015-09-02 LAB — IRON AND TIBC
%SAT: 40 % (ref 20–55)
Iron: 76 ug/dL (ref 42–163)
TIBC: 190 ug/dL — AB (ref 202–409)
UIBC: 114 ug/dL — AB (ref 117–376)

## 2015-09-02 LAB — FERRITIN: FERRITIN: 557 ng/mL — AB (ref 22–316)

## 2015-09-03 ENCOUNTER — Other Ambulatory Visit: Payer: Self-pay | Admitting: Hematology and Oncology

## 2015-09-03 DIAGNOSIS — D469 Myelodysplastic syndrome, unspecified: Secondary | ICD-10-CM

## 2015-09-03 LAB — VITAMIN B12: Vitamin B12: >2000 pg/mL — ABNORMAL HIGH (ref 211–946)

## 2015-09-03 LAB — ERYTHROPOIETIN: ERYTHROPOIETIN: 17.6 m[IU]/mL (ref 2.6–18.5)

## 2015-09-08 ENCOUNTER — Other Ambulatory Visit (HOSPITAL_BASED_OUTPATIENT_CLINIC_OR_DEPARTMENT_OTHER): Payer: Medicare Other

## 2015-09-08 ENCOUNTER — Encounter: Payer: Self-pay | Admitting: Hematology and Oncology

## 2015-09-08 ENCOUNTER — Telehealth: Payer: Self-pay | Admitting: Hematology and Oncology

## 2015-09-08 ENCOUNTER — Telehealth: Payer: Self-pay | Admitting: Interventional Cardiology

## 2015-09-08 ENCOUNTER — Ambulatory Visit (HOSPITAL_BASED_OUTPATIENT_CLINIC_OR_DEPARTMENT_OTHER): Payer: Medicare Other

## 2015-09-08 ENCOUNTER — Ambulatory Visit (HOSPITAL_BASED_OUTPATIENT_CLINIC_OR_DEPARTMENT_OTHER): Payer: Medicare Other | Admitting: Hematology and Oncology

## 2015-09-08 VITALS — BP 156/55 | HR 64 | Temp 98.0°F | Resp 18 | Ht 70.0 in | Wt 165.8 lb

## 2015-09-08 DIAGNOSIS — D696 Thrombocytopenia, unspecified: Secondary | ICD-10-CM | POA: Diagnosis not present

## 2015-09-08 DIAGNOSIS — K922 Gastrointestinal hemorrhage, unspecified: Secondary | ICD-10-CM | POA: Diagnosis not present

## 2015-09-08 DIAGNOSIS — D469 Myelodysplastic syndrome, unspecified: Secondary | ICD-10-CM | POA: Diagnosis not present

## 2015-09-08 DIAGNOSIS — K4090 Unilateral inguinal hernia, without obstruction or gangrene, not specified as recurrent: Secondary | ICD-10-CM

## 2015-09-08 DIAGNOSIS — N184 Chronic kidney disease, stage 4 (severe): Secondary | ICD-10-CM

## 2015-09-08 DIAGNOSIS — I10 Essential (primary) hypertension: Secondary | ICD-10-CM | POA: Diagnosis not present

## 2015-09-08 LAB — CBC & DIFF AND RETIC
BASO%: 0.7 % (ref 0.0–2.0)
Basophils Absolute: 0 10*3/uL (ref 0.0–0.1)
EOS%: 6.2 % (ref 0.0–7.0)
Eosinophils Absolute: 0.3 10*3/uL (ref 0.0–0.5)
HCT: 25.4 % — ABNORMAL LOW (ref 38.4–49.9)
HGB: 8.2 g/dL — ABNORMAL LOW (ref 13.0–17.1)
IMMATURE RETIC FRACT: 7.6 % (ref 3.00–10.60)
LYMPH%: 26.2 % (ref 14.0–49.0)
MCH: 32.9 pg (ref 27.2–33.4)
MCHC: 32.3 g/dL (ref 32.0–36.0)
MCV: 102 fL — AB (ref 79.3–98.0)
MONO#: 0.5 10*3/uL (ref 0.1–0.9)
MONO%: 12.1 % (ref 0.0–14.0)
NEUT#: 2.4 10*3/uL (ref 1.5–6.5)
NEUT%: 54.8 % (ref 39.0–75.0)
PLATELETS: 114 10*3/uL — AB (ref 140–400)
RBC: 2.49 10*6/uL — AB (ref 4.20–5.82)
RDW: 15.9 % — ABNORMAL HIGH (ref 11.0–14.6)
Retic %: 1.05 % (ref 0.80–1.80)
Retic Ct Abs: 26.15 10*3/uL — ABNORMAL LOW (ref 34.80–93.90)
WBC: 4.4 10*3/uL (ref 4.0–10.3)
lymph#: 1.2 10*3/uL (ref 0.9–3.3)

## 2015-09-08 MED ORDER — DARBEPOETIN ALFA 200 MCG/0.4ML IJ SOSY
200.0000 ug | PREFILLED_SYRINGE | Freq: Once | INTRAMUSCULAR | Status: AC
  Administered 2015-09-08: 200 ug via SUBCUTANEOUS
  Filled 2015-09-08: qty 0.4

## 2015-09-08 NOTE — Telephone Encounter (Signed)
The patient is cleared for the upcoming surgery. There is no contraindication to holding Plavix for 5 days prior to the operation. I would prefer if at all possible to continue the low-dose aspirin therapy.

## 2015-09-08 NOTE — Telephone Encounter (Signed)
New message  Amy calling to see if you received the fax sent on 2/21 for surgical clearance  Request for surgical clearance:  1. What type of surgery is being performed?  RT HURNERIA  When is this surgery scheduled? Unknown  2. Are there any medications that need to be held prior to surgery and how long?  Plavix 5 days before  Name of physician performing surgery? Dr. Redmond Pulling 3. What is your office phone and fax number?    [phone]     Fax   [phone]

## 2015-09-08 NOTE — Telephone Encounter (Signed)
Fwd to Dr.Smith for approval

## 2015-09-08 NOTE — Telephone Encounter (Signed)
Appt made and avs printed °

## 2015-09-09 DIAGNOSIS — K4090 Unilateral inguinal hernia, without obstruction or gangrene, not specified as recurrent: Secondary | ICD-10-CM | POA: Insufficient documentation

## 2015-09-09 NOTE — Telephone Encounter (Signed)
Cardiac clearance placed in MR nurse fax box to be faxed attn:Dr.Wilson's office

## 2015-09-09 NOTE — Assessment & Plan Note (Signed)
The patient had history of severe hypertension. His blood pressure is currently under control. He is aware, he would not get his injection if systolic blood pressures greater than 160. 

## 2015-09-09 NOTE — Progress Notes (Signed)
[facility] OFFICE PROGRESS NOTE  Patient Care Team: Hulan Fess, MD as PCP - General (Family Medicine) Belva Crome, MD (Cardiology) Laurence Spates, MD (Gastroenterology) Heath Lark, MD as Consulting Physician (Hematology and Oncology) Patsey Berthold, NP as Nurse Practitioner (Cardiology) Cleon Gustin, MD as Consulting Physician (Urology) Greer Pickerel, MD as Consulting Physician (General Surgery)  SUMMARY OF ONCOLOGIC HISTORY:  DIAGNOSIS: Low-grade myelodysplastic syndrome, history prostate cancer, on erythropoietin stimulating agents for chronic anemia  SUMMARY OF ONCOLOGIC HISTORY: This is a very interesting gentleman with a history of prostate cancer diagnosed in 1997, status post radiation and seed implantation and Lupron injection. The patient cannot recall the stage of his disease. He's been getting Lupron injection every 4 months. He does not recall his last PSA number but was never told he require urgent attention of further treatment apart from just getting Lupron injection. He was also found to have chronic anemia in the past and has been placed on oral ion supplements 4 years of which he took twice a day. His last ferritin level was over 1300. The patient also has extensive EGD and colonoscopy done which showed no evidence of active bleeding. He had B12 level checked recently and was found to have B12 deficiency and was started on B12 injections. The patient also has significant anemia requiring hospitalization and recent blood transfusion. On 05/10/2013 he had bone marrow aspirate and biopsy which is consistent with myelodysplastic syndrome. On 06/22/2013: We started him on erythropoietin stimulating agents for anemia. In November 2016, the patient self discontinued Procrit injection due to loss of insurance On 09/08/2015, I resumed Aranesp treatment with 200 g every 2 week to keep hemoglobin greater than 10 g  INTERVAL HISTORY: Please see below for problem  oriented charting. The patient was lost to follow-up since last year because of loss of insurance. He returns today. He complained of profound fatigue.He denies chest pain or shortness of breath. He denies recent infection. He complained of right inguinal groin pain and is awaiting cardiology clearance to proceed with inguinal hernia repair. He rated his pain a 7 out of 10 pain. He was recently hospitalized for possible GI bleed but that resolved. The patient denies any recent signs or symptoms of bleeding such as spontaneous epistaxis, hematuria or hematochezia.   REVIEW OF SYSTEMS:   Constitutional: Denies fevers, chills or abnormal weight loss Eyes: Denies blurriness of vision Ears, nose, mouth, throat, and face: Denies mucositis or sore throat Respiratory: Denies cough, dyspnea or wheezes Cardiovascular: Denies palpitation, chest discomfort or lower extremity swelling Gastrointestinal:  Denies nausea, heartburn or change in bowel habits Skin: Denies abnormal skin rashes Lymphatics: Denies new lymphadenopathy or easy bruising Neurological:Denies numbness, tingling or new weaknesses Behavioral/Psych: Mood is stable, no new changes  All other systems were reviewed with the patient and are negative.  I have reviewed the past medical history, past surgical history, social history and family history with the patient and they are unchanged from previous note.  ALLERGIES:  is allergic to nsaids; ibuprofen; and ace inhibitors.  MEDICATIONS:  Current Outpatient Prescriptions  Medication Sig Dispense Refill  . acetaminophen (ARTHRITIS PAIN RELIEF) 650 MG CR tablet Take 1,300 mg by mouth See admin instructions. Take 2 tablets (1300 mg) by mouth twice daily, may also take 2 tablets (1300 mg) mid day as needed for arthritis pain    . amiodarone (PACERONE) 200 MG tablet Take one-half tablet by  mouth daily 45 tablet 3  . aspirin 81 MG  tablet Take 1 tablet (81 mg total) by mouth daily.    Marland Kitchen  atorvastatin (LIPITOR) 80 MG tablet Take 80 mg by mouth at bedtime.     . Calcium Carb-Cholecalciferol (CALCIUM 600 + D) 600-200 MG-UNIT TABS Take 1-2 tablets by mouth 2 (two) times daily. Take 2 tablets by mouth every morning and 1 tablet at night    . carvedilol (COREG) 25 MG tablet TAKE 1 TABLET BY MOUTH  TWICE DAILY WITH MEAL 180 tablet 3  . cholecalciferol (VITAMIN D) 1000 UNITS tablet Take 1,000 Units by mouth 2 (two) times daily.     . cloNIDine (CATAPRES) 0.1 MG tablet Take 0.1 mg by mouth 2 (two) times daily.    . clopidogrel (PLAVIX) 75 MG tablet Take 1 tablet by mouth  daily 90 tablet 3  . cyanocobalamin (,VITAMIN B-12,) 1000 MCG/ML injection Inject 1,000 mcg into the muscle every 30 (thirty) days. Vitamin B12 - last injection 07/24/15    . epoetin alfa (EPOGEN,PROCRIT) 29562 UNIT/ML injection Inject 10,000 Units into the skin every 21 ( twenty-one) days. Done at [facility] center - last injection December 2016    . furosemide (LASIX) 80 MG tablet TAKE 1 AND 1/2 TABLETS BY  MOUTH TWICE DAILY (Patient taking differently: Take 120 mg by mouth 2 (two) times daily. ) 270 tablet 3  . hydrALAZINE (APRESOLINE) 50 MG tablet Take 1 tablet (50 mg total) by mouth 3 (three) times daily. 90 tablet 3  . HYDROcodone-acetaminophen (NORCO/VICODIN) 5-325 MG tablet Take 1-2 tablets by mouth every 4 (four) hours as needed for moderate pain. 30 tablet 0  . isosorbide mononitrate (IMDUR) 120 MG 24 hr tablet Take 1 tablet by mouth  daily 90 tablet 3  . Leuprolide Acetate (LUPRON IJ) Inject 1 application as directed every 4 (four) months. Done at urologist's office - last inject December 2016    . potassium chloride SA (K-DUR,KLOR-CON) 20 MEQ tablet Take 20-40 mEq by mouth 3 (three) times daily. Take 2 tablets (40 meq) by mouth with breakfast, take 1 tablet (20 meq) with lunch and supper    . silodosin (RAPAFLO) 8 MG CAPS capsule Take 8 mg by mouth at bedtime.      No current facility-administered medications for  this visit.    PHYSICAL EXAMINATION: ECOG PERFORMANCE STATUS: 2 - Symptomatic, <50% confined to bed  Filed Vitals:   09/08/15 0910  BP: 156/55  Pulse: 64  Temp: 98 F (36.7 C)  Resp: 18   Filed Weights   09/08/15 0910  Weight: 165 lb 12.8 oz (75.206 kg)    GENERAL:alert, no distress and comfortable SKIN: skin color, texture, turgor are normal, no rashes or significant lesions EYES: normal, Conjunctiva are pale and non-injected, sclera clear OROPHARYNX:no exudate, no erythema and lips, buccal mucosa, and tongue normal  NECK: supple, thyroid normal size, non-tender, without nodularity LYMPH:  no palpable lymphadenopathy in the cervical, axillary or inguinal LUNGS: clear to auscultation and percussion with normal breathing effort HEART: regular rate & rhythm and no murmurs and no lower extremity edema ABDOMEN:abdomen soft,with right lower quadrant tenderness without rebound or guarding Musculoskeletal:no cyanosis of digits and no clubbing  NEURO: alert & oriented x 3 with fluent speech, no focal motor/sensory deficits  LABORATORY DATA:  I have reviewed the data as listed    Component Value Date/Time   NA 141 08/30/2015 0329   NA 146* 08/10/2013 1324   K 3.1* 08/30/2015 0329   K 3.9 08/10/2013 1324   CL 106 08/30/2015  0329   CO2 26 08/30/2015 0329   CO2 27 08/10/2013 1324   GLUCOSE 114* 08/30/2015 0329   GLUCOSE 105 08/10/2013 1324   BUN 32* 08/30/2015 0329   BUN 40.3* 08/10/2013 1324   CREATININE 2.39* 08/30/2015 0329   CREATININE 2.6* 08/10/2013 1324   CALCIUM 9.1 08/30/2015 0329   CALCIUM 9.4 08/10/2013 1324   PROT 7.3 08/29/2015 1955   PROT 7.0 08/10/2013 1324   ALBUMIN 4.3 08/29/2015 1955   ALBUMIN 3.8 08/10/2013 1324   AST 20 08/29/2015 1955   AST 14 08/10/2013 1324   ALT 9* 08/29/2015 1955   ALT 8 08/10/2013 1324   ALKPHOS 38 08/29/2015 1955   ALKPHOS 47 08/10/2013 1324   BILITOT 0.4 08/29/2015 1955   BILITOT 0.33 08/10/2013 1324   GFRNONAA 25*  08/30/2015 0329   GFRAA 28* 08/30/2015 0329    No results found for: SPEP, UPEP  Lab Results  Component Value Date   WBC 4.4 09/08/2015   NEUTROABS 2.4 09/08/2015   HGB 8.2* 09/08/2015   HCT 25.4* 09/08/2015   MCV 102.0* 09/08/2015   PLT 114* 09/08/2015      Chemistry      Component Value Date/Time   NA 141 08/30/2015 0329   NA 146* 08/10/2013 1324   K 3.1* 08/30/2015 0329   K 3.9 08/10/2013 1324   CL 106 08/30/2015 0329   CO2 26 08/30/2015 0329   CO2 27 08/10/2013 1324   BUN 32* 08/30/2015 0329   BUN 40.3* 08/10/2013 1324   CREATININE 2.39* 08/30/2015 0329   CREATININE 2.6* 08/10/2013 1324      Component Value Date/Time   CALCIUM 9.1 08/30/2015 0329   CALCIUM 9.4 08/10/2013 1324   ALKPHOS 38 08/29/2015 1955   ALKPHOS 47 08/10/2013 1324   AST 20 08/29/2015 1955   AST 14 08/10/2013 1324   ALT 9* 08/29/2015 1955   ALT 8 08/10/2013 1324   BILITOT 0.4 08/29/2015 1955   BILITOT 0.33 08/10/2013 1324       ASSESSMENT & PLAN:  MDS (myelodysplastic syndrome) (HCC) We discussed some of the risks, benefits, and alternatives of erythropoietin stimulating agents such as Procrit or Aranesp. The patient is symptomatic from anemia and the EPO level is low. Some of the side-effects to be expected including risks of allergic reactions, skin rashes, headaches, risk of blood clots including heart attack and stroke. There is rare risks of causing growth of cancers.The patient is willing to proceed and went ahead to sign consent today.  I will resume treatment every 2 weeks with goal hemoglobin greater than 10 g  GIB (gastrointestinal bleeding) He was recently hospitalized for GI bleed. He was recommended to take iron supplement He does not have any further bleeding He does not require blood transfusion today.  Thrombocytopenia This is due to his underlying bone marrow disorder. He is not symptomatic. Recommend close observation.      Essential hypertension The patient had  history of severe hypertension. His blood pressure is currently under control. He is aware, he would not get his injection if systolic blood pressures greater than 160.  CKD (chronic kidney disease), stage IV [facility]) he will continue current medical management. I recommend close follow-up with primary care doctor for medication adjustment.   Right inguinal hernia He has pain in the right inguinal region. He had recent surgical evaluation in this currently awaiting cardiology clearance. He will continue conservative management right now with pain medication as needed.   No orders of the defined  types were placed in this encounter.   All questions were answered. The patient knows to call the clinic with any problems, questions or concerns. No barriers to learning was detected. I spent 20 minutes counseling the patient face to face. The total time spent in the appointment was 25 minutes and more than 50% was on counseling and review of test results     [facility], Oluwateniola Leitch, MD 09/09/2015 11:13 AM

## 2015-09-09 NOTE — Assessment & Plan Note (Signed)
He has pain in the right inguinal region. He had recent surgical evaluation in this currently awaiting cardiology clearance. He will continue conservative management right now with pain medication as needed.

## 2015-09-09 NOTE — Assessment & Plan Note (Signed)
he will continue current medical management. I recommend close follow-up with primary care doctor for medication adjustment.  

## 2015-09-09 NOTE — Assessment & Plan Note (Signed)
This is due to his underlying bone marrow disorder. He is not symptomatic. Recommend close observation. 

## 2015-09-09 NOTE — Assessment & Plan Note (Signed)
We discussed some of the risks, benefits, and alternatives of erythropoietin stimulating agents such as Procrit or Aranesp. The patient is symptomatic from anemia and the EPO level is low. Some of the side-effects to be expected including risks of allergic reactions, skin rashes, headaches, risk of blood clots including heart attack and stroke. There is rare risks of causing growth of cancers.The patient is willing to proceed and went ahead to sign consent today.  I will resume treatment every 2 weeks with goal hemoglobin greater than 10 g

## 2015-09-09 NOTE — Assessment & Plan Note (Signed)
He was recently hospitalized for GI bleed. He was recommended to take iron supplement He does not have any further bleeding He does not require blood transfusion today.

## 2015-09-11 ENCOUNTER — Telehealth: Payer: Self-pay | Admitting: Interventional Cardiology

## 2015-09-11 NOTE — Telephone Encounter (Signed)
New Message:   Amy called in wanting to speak with Dr. Thompson Caul nurse about getting the pt's appt expedited due to the pt needing to have a rt inguinal hernia repair done as soon as possible. Amy says that possible incarceration and strangulation is detected. Please f/u with her as soon as possible.

## 2015-09-11 NOTE — Telephone Encounter (Signed)
Spoke with Amy and told her I would refax previous phone note from Dr. Tamala Julian regarding surgery clearance. Fax number is [phone].

## 2015-09-15 ENCOUNTER — Telehealth: Payer: Self-pay

## 2015-09-15 NOTE — Telephone Encounter (Signed)
Patient called stating that he was applying for assistance and was trying to get his diagnosis.  Writer provided him with Dr. Calton Dach fax number as well as her nurse's name that is working with her today.  Patient to call the State and have them fax over forms to MD.

## 2015-09-22 ENCOUNTER — Ambulatory Visit (HOSPITAL_BASED_OUTPATIENT_CLINIC_OR_DEPARTMENT_OTHER): Payer: Medicare Other

## 2015-09-22 ENCOUNTER — Other Ambulatory Visit: Payer: Medicare Other

## 2015-09-22 ENCOUNTER — Other Ambulatory Visit (HOSPITAL_BASED_OUTPATIENT_CLINIC_OR_DEPARTMENT_OTHER): Payer: Medicare Other

## 2015-09-22 ENCOUNTER — Ambulatory Visit: Payer: Medicare Other

## 2015-09-22 VITALS — BP 124/52 | HR 60 | Temp 97.3°F

## 2015-09-22 DIAGNOSIS — D469 Myelodysplastic syndrome, unspecified: Secondary | ICD-10-CM

## 2015-09-22 LAB — CBC & DIFF AND RETIC
BASO%: 0.3 % (ref 0.0–2.0)
Basophils Absolute: 0 10*3/uL (ref 0.0–0.1)
EOS ABS: 0.2 10*3/uL (ref 0.0–0.5)
EOS%: 3.4 % (ref 0.0–7.0)
HCT: 29 % — ABNORMAL LOW (ref 38.4–49.9)
HGB: 9.3 g/dL — ABNORMAL LOW (ref 13.0–17.1)
IMMATURE RETIC FRACT: 8.8 % (ref 3.00–10.60)
LYMPH%: 13.6 % — ABNORMAL LOW (ref 14.0–49.0)
MCH: 33.9 pg — AB (ref 27.2–33.4)
MCHC: 32.1 g/dL (ref 32.0–36.0)
MCV: 105.8 fL — AB (ref 79.3–98.0)
MONO#: 0.6 10*3/uL (ref 0.1–0.9)
MONO%: 8.4 % (ref 0.0–14.0)
NEUT%: 74.3 % (ref 39.0–75.0)
NEUTROS ABS: 5.3 10*3/uL (ref 1.5–6.5)
NRBC: 0 % (ref 0–0)
Platelets: 118 10*3/uL — ABNORMAL LOW (ref 140–400)
RBC: 2.74 10*6/uL — AB (ref 4.20–5.82)
RDW: 18 % — AB (ref 11.0–14.6)
Retic %: 2.27 % — ABNORMAL HIGH (ref 0.80–1.80)
Retic Ct Abs: 62.2 10*3/uL (ref 34.80–93.90)
WBC: 7.1 10*3/uL (ref 4.0–10.3)
lymph#: 1 10*3/uL (ref 0.9–3.3)

## 2015-09-22 MED ORDER — DARBEPOETIN ALFA 200 MCG/0.4ML IJ SOSY
200.0000 ug | PREFILLED_SYRINGE | Freq: Once | INTRAMUSCULAR | Status: AC
Start: 2015-09-22 — End: 2015-09-22
  Administered 2015-09-22: 200 ug via SUBCUTANEOUS
  Filled 2015-09-22: qty 0.4

## 2015-09-24 ENCOUNTER — Ambulatory Visit: Payer: Self-pay | Admitting: General Surgery

## 2015-09-24 NOTE — H&P (Signed)
[name] 09/04/2015 4:21 PM Location: [facility] Patient #: [id number] DOB: February 14, 1936 Married / Language: English / Race: Black or African American Male   History of Present Illness [name] Hiss M. [name] Robideau MD; 09/04/2015 5:42 PM) Patient words: hernia.  The patient is a 80 year old male who presents with an inguinal hernia. He is referred by Dr [name] for evaluation of a right inguinal hernia. He is accompanied by his wife and son. He was in the hospital from February 17 through the 19th for abdominal pain & a questionable lower GI bleed. He states he has had a hernia or known about it for a few months. It comes and goes. However about 2 weeks ago he had an episode where it was uncomfortable and hard to push back in. He denies any nausea or vomiting. He denies any diarrhea or constipation. He does have some dyspnea on exertion, and some occasional orthopnea. However he denies shortness of breath at rest and paroxysmal nocturnal dyspnea. He denies any edema. He does have a history of heart failure. He is on Plavix. He has stage IV chronic kidney disease. He does have a history of anemia. His most recent hemoglobin was 7.8. 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 He does have some nocturia and some trouble initiating his stream. He is on a BPH medication   Problem List/Past Medical [name] Curry, MD; 09/04/2015 5:46 PM) PAROXYSMAL ATRIAL FIBRILLATION (I48.0) CHRONIC ANTICOAGULATION (Z79.01) RIGHT INGUINAL HERNIA (K40.90) CHRONIC KIDNEY DISEASE, STAGE 4 (SEVERE) (N18.4)  Other Problems [name] Curry, MD; 09/04/2015 5:46 PM) Arthritis Atrial Fibrillation Back Pain Chest pain Diabetes Mellitus Enlarged Prostate Gastric Ulcer Gastroesophageal Reflux Disease High blood pressure Hypercholesterolemia Myocardial infarction Pancreatitis Prostate Cancer Vascular Disease Congestive Heart Failure  Past Surgical  History [name] Curry, MD; 09/04/2015 5:45 PM) Bypass Surgery for Poor Blood Flow to Legs Coronary Artery Bypass Graft Gallbladder Surgery - Open Resection of Stomach  Diagnostic Studies History [name] Curry, MD; 09/04/2015 5:45 PM) Colonoscopy 5-10 years ago  Allergies [name], [name]; 09/04/2015 4:23 PM) ACE Inhibitors NSAIDs Ibuprofen *ANALGESICS - ANTI-INFLAMMATORY*  Medication History [name], [name]; 09/04/2015 4:24 PM) Amiodarone HCl (200MG  Tablet, Oral) Active. Hydrocodone-Acetaminophen (5-325MG  Tablet, Oral as needed) Active. Atorvastatin Calcium (80MG  Tablet, Oral) Active. AmLODIPine Besylate (10MG  Tablet, Oral) Active. Carvedilol (25MG  Tablet, Oral) Active. CloNIDine HCl (0.2MG  Tablet, Oral) Active. Clopidogrel Bisulfate (75MG  Tablet, Oral) Active. Furosemide (80MG  Tablet, Oral) Active. Isosorbide Mononitrate ER (120MG  Tablet ER 24HR, Oral) Active. Klor-Con M20 [facility] Tablet ER, Oral) Active. Pantoprazole Sodium (40MG  Tablet DR, Oral) Active. HydrALAZINE HCl (50MG  Tablet, Oral) Active. Rapaflo (8MG  Capsule, Oral) Active. Medications Reconciled  Social History [name] Curry, MD; 09/04/2015 5:45 PM) Alcohol use Remotely quit alcohol use. Caffeine use Carbonated beverages, Coffee. No drug use Tobacco use Former smoker.  Family History [name] Curry, MD; 09/04/2015 5:45 PM) Breast Cancer Sister. Cerebrovascular Accident Son. Colon Cancer Father. Diabetes Mellitus Daughter, Mother, Son. Heart Disease Son. Hypertension Mother.    Review of Systems [name] Hiss M. Xander Jutras MD; 09/04/2015 5:45 PM) General Not Present- Appetite Loss, Chills, Fatigue, Fever, Night Sweats, Weight Gain and Weight Loss. Skin Not Present- Change in Wart/Mole, Dryness, Hives, Jaundice, New Lesions, Non-Healing Wounds, Rash and Ulcer. HEENT Not Present- Earache, Hearing Loss, Hoarseness, Nose Bleed, Oral Ulcers, Ringing in the Ears, Seasonal Allergies, Sinus Pain,  Sore Throat, Visual Disturbances, Wears glasses/contact lenses and Yellow Eyes. Respiratory Not Present- Bloody sputum, Chronic Cough, Difficulty  Breathing, Snoring and Wheezing. Breast Not Present- Breast Mass, Breast Pain, Nipple Discharge and Skin Changes. Cardiovascular Present- Palpitations and Shortness of Breath. Not Present- Chest Pain, Difficulty Breathing Lying Down, Leg Cramps, Rapid Heart Rate and Swelling of Extremities. Gastrointestinal Present- Abdominal Pain, Bloating and Hemorrhoids. Not Present- Bloody Stool, Change in Bowel Habits, Chronic diarrhea, Constipation, Difficulty Swallowing, Excessive gas, Gets full quickly at meals, Indigestion, Nausea, Rectal Pain and Vomiting. Male Genitourinary Present- Impotence and Nocturia. Not Present- Blood in Urine, Change in Urinary Stream, Frequency, Painful Urination, Urgency and Urine Leakage.  Vitals ([name] [name]; 09/04/2015 4:21 PM) 09/04/2015 4:21 PM Weight: 166 lb Height: 70in Body Surface Area: 1.93 m Body Mass Index: 23.82 kg/m  Temp.: 54F(Temporal)  Pulse: 75 (Regular)  BP: 126/80 (Sitting, Left Arm, Standard)       Physical Exam [name] Hiss M. Kamaljit Hizer MD; 09/04/2015 5:43 PM) General Mental Status-Alert. General Appearance-Consistent with stated age. Hydration-Well hydrated. Voice-Normal. Note: Slightly frail in appearance   Head and Neck Head-normocephalic, atraumatic with no lesions or palpable masses. Trachea-midline. Thyroid Gland Characteristics - normal size and consistency.  Eye Eyeball - Bilateral-Extraocular movements intact. Sclera/Conjunctiva - Bilateral-No scleral icterus.  Chest and Lung Exam Chest and lung exam reveals -quiet, even and easy respiratory effort with no use of accessory muscles and on auscultation, normal breath sounds, no adventitious sounds and normal vocal resonance. Inspection Chest Wall - Chest Wall - Inspection of the chest wall reveals - Note:  Healed sternal incision. Back - normal.  Breast - Did not examine.  Cardiovascular Cardiovascular examination reveals -normal heart sounds, regular rate and rhythm with no murmurs and normal pedal pulses bilaterally.  Abdomen Inspection Inspection of the abdomen reveals - No Hernias. Skin - Scar - Note: Has midline incision no incisional hernia. Palpation/Percussion Palpation and Percussion of the abdomen reveal - Soft, Non Tender, No Rebound tenderness, No Rigidity (guarding) and No hepatosplenomegaly. Auscultation Auscultation of the abdomen reveals - Bowel sounds normal.  Male Genitourinary Note: Both testicles descended. No scrotal masses. Obvious right small groin bulge. Easily reducible. No left bulge or enlarged ring with Valsalva   Peripheral Vascular Upper Extremity Palpation - Pulses bilaterally normal.  Neurologic Neurologic evaluation reveals -alert and oriented x 3 with no impairment of recent or remote memory. Mental Status-Normal.  Neuropsychiatric The patient's mood and affect are described as -normal. Judgment and Insight-insight is appropriate concerning matters relevant to self.  Musculoskeletal Normal Exam - Left-Upper Extremity Strength Normal and Lower Extremity Strength Normal. Normal Exam - Right-Upper Extremity Strength Normal and Lower Extremity Strength Normal.  Lymphatic Head & Neck  General Head & Neck Lymphatics: Bilateral - Description - Normal. Axillary - Did not examine. Femoral & Inguinal - Did not examine.    Assessment & Plan [name] Hiss M. Yumalay Circle MD; 09/04/2015 5:46 PM) RIGHT INGUINAL HERNIA (K40.90) Impression: We discussed the etiology of inguinal hernias. We discussed the signs & symptoms of incarceration & strangulation. We discussed non-operative and operative management.  The patient has elected to proceed with open repair of right inguinal hernia with mesh. He understands that we need to obtain cardiology clearance  for surgery in addition to permission to holding his blood thinner perioperatively before scheduling surgery   I described the procedure in detail. The patient was given educational material. We discussed the risks and benefits including but not limited to bleeding, infection, chronic inguinal pain, nerve entrapment, hernia recurrence, mesh complications, hematoma formation, urinary retention, injury to the testicles, numbness in the groin, blood clots, injury  to the surrounding structures, and anesthesia risk. We also discussed the typical post operative recovery course, including no heavy lifting for 4-6 weeks. I explained that the likelihood of improvement of their symptoms is good  I did discuss with him that he is at slightly higher risk for bleeding, hematoma, urinary retention, perioperative cardiac and pulmonary events given his underlying medical issues. Current Plans I recommended obtaining preoperative cardiac clearance. I am concerned about the health of the patient and the ability to tolerate the operation. Therefore, we will request clearance by cardiology to better assess operative risk & see if a reevaluation, further workup, etc is needed. Also recommendations on how medications such as for anticoagulation and blood pressure should be managed/held/restarted after surgery.  Our office will contact you to schedule surgery after we have recieved cardiology clearance  Pt Education - Pamphlet Given - Hernia Surgery: discussed with patient and provided information. CHRONIC CONGESTIVE HEART FAILURE, UNSPECIFIED CONGESTIVE HEART FAILURE TYPE (I50.9) PAROXYSMAL ATRIAL FIBRILLATION (I48.0) CHRONIC ANTICOAGULATION (Z79.01) CHRONIC KIDNEY DISEASE, STAGE 4 (SEVERE) (N18.4)  Leighton Ruff. Redmond Pulling, MD, FACS General, Bariatric, & Minimally Invasive Surgery [facility], [place]

## 2015-09-25 NOTE — Patient Instructions (Addendum)
[name] TOTIN Sr.  09/25/2015   Your procedure is scheduled on: 09/30/2015    Report to [facility] Main  Entrance take [place]  elevators to 3rd floor to  [place] at   [place] AM.  Call this number if you have problems the morning of surgery [phone]   Remember: ONLY 1 PERSON MAY GO WITH YOU TO SHORT STAY TO GET  READY MORNING OF [place].  Do not eat food or drink liquids :After Midnight.     Take these medicines the morning of surgery with A SIP OF WATER: Amiodarone ( pacerone), Carvedilol ( coreg), hydralazine ( apresoline), Clonidine ( Catapres), Imdur                                 You may not have any metal on your body including hair pins and              piercings  Do not wear jewelry, , lotions, powders or perfumes, deodorant             .              Men may shave face and neck.   Do not bring valuables to the hospital. [place].  Contacts, dentures or bridgework may not be worn into surgery.  Leave suitcase in the car. After surgery it may be brought to your room.                Please read over the following fact sheets you were given: _____________________________________________________________________             [facility] - Preparing for Surgery Before surgery, you can play an important role.  Because skin is not sterile, your skin needs to be as free of germs as possible.  You can reduce the number of germs on your skin by washing with CHG (chlorahexidine gluconate) soap before surgery.  CHG is an antiseptic cleaner which kills germs and bonds with the skin to continue killing germs even after washing. Please DO NOT use if you have an allergy to CHG or antibacterial soaps.  If your skin becomes reddened/irritated stop using the CHG and inform your nurse when you arrive at Short Stay. Do not shave (including legs and underarms) for at least 48 hours prior to the first CHG  shower.  You may shave your face/neck. Please follow these instructions carefully:  1.  Shower with CHG Soap the night before surgery and the  morning of Surgery.  2.  If you choose to wash your hair, wash your hair first as usual with your  normal  shampoo.  3.  After you shampoo, rinse your hair and body thoroughly to remove the  shampoo.                           4.  Use CHG as you would any other liquid soap.  You can apply chg directly  to the skin and wash                       Gently with a scrungie or clean washcloth.  5.  Apply the  CHG Soap to your body ONLY FROM THE NECK DOWN.   Do not use on face/ open                           Wound or open sores. Avoid contact with eyes, ears mouth and genitals (private parts).                       Wash face,  Genitals (private parts) with your normal soap.             6.  Wash thoroughly, paying special attention to the area where your surgery  will be performed.  7.  Thoroughly rinse your body with warm water from the neck down.  8.  DO NOT shower/wash with your normal soap after using and rinsing off  the CHG Soap.                9.  Pat yourself dry with a clean towel.            10.  Wear clean pajamas.            11.  Place clean sheets on your bed the night of your first shower and do not  sleep with pets. Day of Surgery : Do not apply any lotions/deodorants the morning of surgery.  Please wear clean clothes to the hospital/surgery center.  FAILURE TO FOLLOW THESE INSTRUCTIONS MAY RESULT IN THE CANCELLATION OF YOUR SURGERY PATIENT SIGNATURE_________________________________  NURSE SIGNATURE__________________________________  ________________________________________________________________________

## 2015-09-26 ENCOUNTER — Encounter (HOSPITAL_COMMUNITY): Payer: Self-pay

## 2015-09-26 ENCOUNTER — Encounter (HOSPITAL_COMMUNITY)
Admission: RE | Admit: 2015-09-26 | Discharge: 2015-09-26 | Disposition: A | Discharge status: Home / Self Care | Payer: Medicare Other | Source: Ambulatory Visit | Attending: General Surgery | Admitting: General Surgery

## 2015-09-26 DIAGNOSIS — I10 Essential (primary) hypertension: Secondary | ICD-10-CM | POA: Insufficient documentation

## 2015-09-26 DIAGNOSIS — Z01812 Encounter for preprocedural laboratory examination: Secondary | ICD-10-CM | POA: Insufficient documentation

## 2015-09-26 DIAGNOSIS — Z01818 Encounter for other preprocedural examination: Secondary | ICD-10-CM | POA: Diagnosis not present

## 2015-09-26 DIAGNOSIS — R9431 Abnormal electrocardiogram [ECG] [EKG]: Secondary | ICD-10-CM | POA: Insufficient documentation

## 2015-09-26 HISTORY — DX: Presence of cardiac pacemaker: Z95.0

## 2015-09-26 HISTORY — DX: Unspecified osteoarthritis, unspecified site: M19.90

## 2015-09-26 HISTORY — DX: Personal history of other diseases of the digestive system: Z87.19

## 2015-09-26 LAB — BASIC METABOLIC PANEL
Anion gap: 13 (ref 5–15)
BUN: 60 mg/dL — AB (ref 6–20)
CALCIUM: 10.1 mg/dL (ref 8.9–10.3)
CO2: 28 mmol/L (ref 22–32)
CREATININE: 3.27 mg/dL — AB (ref 0.61–1.24)
Chloride: 102 mmol/L (ref 101–111)
GFR calc Af Amer: 19 mL/min — ABNORMAL LOW (ref >60)
GFR calc non Af Amer: 17 mL/min — ABNORMAL LOW (ref >60)
GLUCOSE: 168 mg/dL — AB (ref 65–99)
Potassium: 4.3 mmol/L (ref 3.5–5.1)
Sodium: 143 mmol/L (ref 135–145)

## 2015-09-26 LAB — CBC WITH DIFFERENTIAL/PLATELET
BASOS PCT: 0 %
Basophils Absolute: 0 10*3/uL (ref 0.0–0.1)
EOS PCT: 3 %
Eosinophils Absolute: 0.1 10*3/uL (ref 0.0–0.7)
HEMATOCRIT: 30.2 % — AB (ref 39.0–52.0)
Hemoglobin: 9.3 g/dL — ABNORMAL LOW (ref 13.0–17.0)
Lymphocytes Relative: 18 %
Lymphs Abs: 0.9 10*3/uL (ref 0.7–4.0)
MCH: 33.8 pg (ref 26.0–34.0)
MCHC: 30.8 g/dL (ref 30.0–36.0)
MCV: 109.8 fL — ABNORMAL HIGH (ref 78.0–100.0)
MONO ABS: 0.5 10*3/uL (ref 0.1–1.0)
MONOS PCT: 11 %
NEUTROS ABS: 3.3 10*3/uL (ref 1.7–7.7)
Neutrophils Relative %: 68 %
Platelets: 157 10*3/uL (ref 150–400)
RBC: 2.75 MIL/uL — ABNORMAL LOW (ref 4.22–5.81)
RDW: 16.9 % — AB (ref 11.5–15.5)
WBC: 4.9 10*3/uL (ref 4.0–10.5)

## 2015-09-26 NOTE — Pre-Procedure Instructions (Addendum)
Pt has pacemaker.  Cardiac device sheet signed and on chart. Last device check 03/2015. [facility] 09-09-15 epic [facility] 03-27-15 epic Cardiology Clearance 09/08/15 epic, telephone note  Pt stated his last dose of Aspirin and Plavix was Wednesday 09-24-15

## 2015-09-27 LAB — HEMOGLOBIN A1C
HEMOGLOBIN A1C: 5.9 % — AB (ref 4.8–5.6)
MEAN PLASMA GLUCOSE: 123 mg/dL

## 2015-09-29 ENCOUNTER — Encounter (HOSPITAL_COMMUNITY): Payer: Self-pay | Admitting: Certified Registered Nurse Anesthetist

## 2015-09-30 ENCOUNTER — Ambulatory Visit (HOSPITAL_COMMUNITY): Admission: RE | Admit: 2015-09-30 | Payer: Medicare Other | Source: Ambulatory Visit | Admitting: General Surgery

## 2015-09-30 ENCOUNTER — Encounter (HOSPITAL_COMMUNITY): Admission: RE | Payer: Self-pay | Source: Ambulatory Visit

## 2015-09-30 SURGERY — REPAIR, HERNIA, INGUINAL, ADULT
Anesthesia: General | Laterality: Right

## 2015-10-02 ENCOUNTER — Other Ambulatory Visit: Payer: Self-pay | Admitting: Interventional Cardiology

## 2015-10-02 NOTE — Telephone Encounter (Signed)
REFILL 

## 2015-10-06 ENCOUNTER — Other Ambulatory Visit (HOSPITAL_BASED_OUTPATIENT_CLINIC_OR_DEPARTMENT_OTHER): Payer: Medicare Other

## 2015-10-06 ENCOUNTER — Ambulatory Visit: Payer: Medicare Other

## 2015-10-06 DIAGNOSIS — D469 Myelodysplastic syndrome, unspecified: Secondary | ICD-10-CM

## 2015-10-06 LAB — CBC & DIFF AND RETIC
BASO%: 0.4 % (ref 0.0–2.0)
BASOS ABS: 0 10*3/uL (ref 0.0–0.1)
EOS%: 4.7 % (ref 0.0–7.0)
Eosinophils Absolute: 0.2 10*3/uL (ref 0.0–0.5)
HEMATOCRIT: 32.8 % — AB (ref 38.4–49.9)
HEMOGLOBIN: 10.5 g/dL — AB (ref 13.0–17.1)
Immature Retic Fract: 7.5 % (ref 3.00–10.60)
LYMPH%: 24.3 % (ref 14.0–49.0)
MCH: 34.3 pg — AB (ref 27.2–33.4)
MCHC: 32 g/dL (ref 32.0–36.0)
MCV: 107.2 fL — ABNORMAL HIGH (ref 79.3–98.0)
MONO#: 0.6 10*3/uL (ref 0.1–0.9)
MONO%: 12.3 % (ref 0.0–14.0)
NEUT#: 2.8 10*3/uL (ref 1.5–6.5)
NEUT%: 58.3 % (ref 39.0–75.0)
PLATELETS: 150 10*3/uL (ref 140–400)
RBC: 3.06 10*6/uL — ABNORMAL LOW (ref 4.20–5.82)
RDW: 17.4 % — ABNORMAL HIGH (ref 11.0–14.6)
Retic %: 2.18 % — ABNORMAL HIGH (ref 0.80–1.80)
Retic Ct Abs: 66.71 10*3/uL (ref 34.80–93.90)
WBC: 4.9 10*3/uL (ref 4.0–10.3)
lymph#: 1.2 10*3/uL (ref 0.9–3.3)

## 2015-10-06 MED ORDER — DARBEPOETIN ALFA 200 MCG/0.4ML IJ SOSY: 200.0000 ug | PREFILLED_SYRINGE | Freq: Once | INTRAMUSCULAR | Status: DC

## 2015-10-13 ENCOUNTER — Ambulatory Visit: Payer: Medicare Other

## 2015-10-13 ENCOUNTER — Other Ambulatory Visit: Payer: Medicare Other

## 2015-10-17 ENCOUNTER — Ambulatory Visit (INDEPENDENT_AMBULATORY_CARE_PROVIDER_SITE_OTHER): Payer: Medicare Other | Admitting: Interventional Cardiology

## 2015-10-17 ENCOUNTER — Encounter: Payer: Self-pay | Admitting: Interventional Cardiology

## 2015-10-17 VITALS — BP 126/68 | HR 66 | Ht 70.0 in | Wt 157.0 lb

## 2015-10-17 DIAGNOSIS — I480 Paroxysmal atrial fibrillation: Secondary | ICD-10-CM

## 2015-10-17 DIAGNOSIS — I25810 Atherosclerosis of coronary artery bypass graft(s) without angina pectoris: Secondary | ICD-10-CM | POA: Diagnosis not present

## 2015-10-17 DIAGNOSIS — I5032 Chronic diastolic (congestive) heart failure: Secondary | ICD-10-CM | POA: Diagnosis not present

## 2015-10-17 DIAGNOSIS — N184 Chronic kidney disease, stage 4 (severe): Secondary | ICD-10-CM

## 2015-10-17 DIAGNOSIS — Z79899 Other long term (current) drug therapy: Secondary | ICD-10-CM

## 2015-10-17 DIAGNOSIS — I10 Essential (primary) hypertension: Secondary | ICD-10-CM

## 2015-10-17 LAB — HEPATIC FUNCTION PANEL
ALBUMIN: 4.6 g/dL (ref 3.6–5.1)
ALT: 6 U/L — AB (ref 9–46)
AST: 17 U/L (ref 10–35)
Alkaline Phosphatase: 35 U/L — ABNORMAL LOW (ref 40–115)
BILIRUBIN DIRECT: 0.1 mg/dL (ref <=0.2)
BILIRUBIN TOTAL: 0.6 mg/dL (ref 0.2–1.2)
Indirect Bilirubin: 0.5 mg/dL (ref 0.2–1.2)
Total Protein: 7.6 g/dL (ref 6.1–8.1)

## 2015-10-17 LAB — TSH: TSH: 3.71 m[IU]/L (ref 0.40–4.50)

## 2015-10-17 NOTE — Patient Instructions (Signed)
Medication Instructions:  Your physician recommends that you continue on your current medications as directed. Please refer to the Current Medication list given to you today.   Labwork: Liver profile/TSH today.  Your physician recommends that you return for lab work in: 6 months when you see Dr Conrad [place] profile/TSH.    Testing/Procedures: None  Follow-Up: Your physician wants you to follow-up in: 6 months with Dr Tamala Julian. (October 2017). You will receive a reminder letter in the mail two months in advance. If you don't receive a letter, please call our office to schedule the follow-up appointment.        If you need a refill on your cardiac medications before your next appointment, please call your pharmacy.

## 2015-10-17 NOTE — Progress Notes (Signed)
Cardiology Office Note   Date:  10/17/2015   ID:  [name], DOB 11/12/1935, MRN [id number]  PCP:  [name], MD  Cardiologist:  Sinclair Grooms, MD   Chief Complaint  Patient presents with  . Coronary Artery Disease      History of Present Illness: [name]. is a 80 y.o. male who presents for CAD, previous CABG, PAD, chronic diastolic heart failure, paroxysmal atrial fibrillation, chronic amiodarone therapy, mesenteric stenosis, sick sinus syndrome, permanent pacemaker, and myelodysplastic syndrome with combined anemia related to blood loss and Red cell underproduction.  The patient has increased heaviness and discomfort in the legs with walking. It has gotten some better as his hemoglobin has improved.  He has rare episodes of orthopnea. None recently.  He denies chest discomfort. Even when hemoglobin was as low as 8 in February, he did not have angina. He denies any recent nitroglycerin use.  He has occasional palpitations. It doesn't last longer can go for his long as an hour. This is infrequent.      Past Medical History  Diagnosis Date  . Hypertension   . Hyperlipidemia   . Ulcer   . GERD (gastroesophageal reflux disease)   . DVT (deep venous thrombosis) ([place]) 2011    Right arm  . Atrial fibrillation ([place])   . Thrombocytopenia, unspecified ([place]) 04/24/2013  . Gout   . Depressive disorder   . Internal hemorrhoids without mention of complication   . Gastroparesis   . Osteopenia   . Ischemic colitis ([place])   . Peripheral artery disease ([place])   . Diabetes mellitus type 2 with peripheral artery disease ([place])   . Chronic diastolic heart failure ([place])   . 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 Renal artery stenosis ([place])   . CKD (chronic kidney disease)   . Vitamin B 12 deficiency   . CAD (coronary artery disease)     s/b CABG 1994, and subsequent stents. Repeat CABG 12/2011,  . Prostate cancer ([place]) 1997    XRT and lupron  .  Myelodysplastic syndrome ([place]) 05/22/2013    With low hemoglobin and platelets treated with Procrit  . Angiomyolipoma 2009    On both kidneys noted in 2009  . Sick sinus syndrome ([place])   . Hematuria 05/19/2015  . Peptic ulcer     S/p partial gastrectomy in 1969  . Complication of anesthesia   . Myocardial infarction ([place])   . Anginal pain ([place])   . Dysrhythmia   . Presence of permanent cardiac pacemaker   . Arthritis     neck and left wrist  . Anemia   . History of hiatal hernia     Past Surgical History  Procedure Laterality Date  . Hiatal hernia repair      and ulcer repair  . Appendectomy  1991  . Pacemaker insertion  10/18/1994    DDD pacemaker, [employer]. Gen change 12/10/2003.  Marland Kitchen Coronary artery bypass graft  01/22/1993  . Cholecystectomy  Oct 2009    Laparoscopic  . Coronary artery bypass graft  01/03/2012    Procedure: REDO CORONARY ARTERY BYPASS GRAFTING (CABG);  Surgeon: Gaye Pollack, MD;  Location: [facility];  Service: Open Heart Surgery;  Laterality: N/A;  Redo CABG x  using bilateral internal mammary arteries;  left leg greater saphenous vein harvested endoscopically  . Celiac artery angioplasty  05-16-12    and stenting  . Partial gastrectomy  1969    Hx of  ulcer s/p partial gastrectomy/ has pernicious anemia  . Other surgical history  02/13/13    superior mesenteric artery angiogram  . Thrombectomy / embolectomy subclavian artery  02/02/10    Right subclavian thromboectomy and venous angioplasty, and chronic mesenteric ischemia with Herculink stenting to superior mesenteric and celiac arteries - Dr. Trula Slade  . Other surgical history  05/16/12    Stent in stomach  . Pacemaker generator change  12/10/2003    SJM Identity XL DR performed by Dr Leonia Reeves  . Visceral angiogram N/A 05/25/2011    Procedure: VISCERAL ANGIOGRAM;  Surgeon: Serafina Mitchell, MD;  Location: [facility] CATH LAB;  Service: Cardiovascular;  Laterality: N/A;  . Abdominal angiogram N/A 05/25/2011    Procedure:  ABDOMINAL ANGIOGRAM;  Surgeon: Serafina Mitchell, MD;  Location: [facility] CATH LAB;  Service: Cardiovascular;  Laterality: N/A;  . Lower extremity angiogram Bilateral 05/25/2011    Procedure: LOWER EXTREMITY ANGIOGRAM;  Surgeon: Serafina Mitchell, MD;  Location: [facility] C[facility] LAB;  Service: Cardiovascular;  Laterality: Bilateral;  . Left heart catheterization with coronary/graft angiogram  12/24/2011    Procedure: LEFT HEART CATHETERIZATION WITH Beatrix Fetters;  Surgeon: Sinclair Grooms, MD;  Location: [facility] CATH LAB;  Service: Cardiovascular;;  . Visceral angiogram Bilateral 12/28/2011    Procedure: VISCERAL ANGIOGRAM;  Surgeon: Serafina Mitchell, MD;  Location: [facility] CATH LAB;  Service: Cardiovascular;  Laterality: Bilateral;  . Visceral angiogram N/A 05/16/2012    Procedure: VISCERAL ANGIOGRAM;  Surgeon: Serafina Mitchell, MD;  Location: [facility] CATH LAB;  Service: Cardiovascular;  Laterality: N/A;  . Visceral angiogram N/A 02/13/2013    Procedure: VISCERAL ANGIOGRAM;  Surgeon: Serafina Mitchell, MD;  Location: [facility] CATH LAB;  Service: Cardiovascular;  Laterality: N/A;  . Renal angiogram N/A 02/13/2013    Procedure: RENAL ANGIOGRAM;  Surgeon: Serafina Mitchell, MD;  Location: [facility] CATH LAB;  Service: Cardiovascular;  Laterality: N/A;  . Abdominal angiogram N/A 02/13/2013    Procedure: ABDOMINAL ANGIOGRAM;  Surgeon: Serafina Mitchell, MD;  Location: [facility] CATH LAB;  Service: Cardiovascular;  Laterality: N/A;     Current Outpatient Prescriptions  Medication Sig Dispense Refill  . acetaminophen (ARTHRITIS PAIN RELIEF) 650 MG CR tablet Take 1,300 mg by mouth 3 (three) times daily.     Marland Kitchen amiodarone (PACERONE) 200 MG tablet Take one-half tablet by  mouth daily 45 tablet 0  . aspirin 81 MG tablet Take 1 tablet (81 mg total) by mouth daily.    Marland Kitchen atorvastatin (LIPITOR) 80 MG tablet Take 80 mg by mouth at bedtime.     . Calcifediol ER (RAYALDEE) 30 MCG CPCR Take 30 mcg by mouth at bedtime.    Marland Kitchen CALCIUM-VITAMIN D PO Take 1 capsule by mouth  2 (two) times daily.    . carvedilol (COREG) 25 MG tablet TAKE 1 TABLET BY MOUTH  TWICE DAILY WITH MEALS 180 tablet 0  . cloNIDine (CATAPRES) 0.1 MG tablet Take 0.1 mg by mouth 2 (two) times daily.    . clopidogrel (PLAVIX) 75 MG tablet Take 1 tablet by mouth  daily 90 tablet 0  . cyanocobalamin (,VITAMIN B-12,) 1000 MCG/ML injection Inject 1,000 mcg into the muscle every 30 (thirty) days. Vitamin B12 - last injection 09/01/15    . epoetin alfa (EPOGEN,PROCRIT) 29562 UNIT/ML injection Inject 10,000 Units into the skin every 14 (fourteen) days. Done at [facility] center - last injection 09/22/15    . furosemide (LASIX) 80 MG tablet TAKE 1 AND 1/2 TABLETS BY  MOUTH  TWICE DAILY (Patient taking differently: Take 120 mg by mouth 2 (two) times daily. ) 270 tablet 3  . hydrALAZINE (APRESOLINE) 50 MG tablet Take 1 tablet (50 mg total) by mouth 3 (three) times daily. 90 tablet 3  . isosorbide mononitrate (IMDUR) 120 MG 24 hr tablet Take 1 tablet by mouth  daily 90 tablet 0  . leuprolide (LUPRON) 30 MG injection Inject 30 mg into the muscle every 4 (four) months.    . Multiple Vitamin (MULTIVITAMIN WITH MINERALS) TABS tablet Take 1 tablet by mouth every morning.    . potassium chloride SA (K-DUR,KLOR-CON) 20 MEQ tablet Take 20-40 mEq by mouth 3 (three) times daily. Take 2 tablets (40 meq) by mouth with breakfast, take 1 tablet (20 meq) with lunch and supper    . PRESCRIPTION MEDICATION Supportive [facility]    . silodosin (RAPAFLO) 8 MG CAPS capsule Take 8 mg by mouth at bedtime.     Marland Kitchen spironolactone (ALDACTONE) 25 MG tablet Take 25 mg by mouth daily.     No current facility-administered medications for this visit.    Allergies:   Nsaids; Ibuprofen; and Ace inhibitors    Social History:  The patient  reports that he quit smoking about 46 years ago. His smoking use included Cigarettes. He quit after 20 years of use. He has never used smokeless tobacco. He reports that he does not drink alcohol or use  illicit drugs.   Family History:  The patient's family history includes CAD in his brother; CVA (age of onset: 39) in his mother; Cancer in his father, paternal uncle, and sister; Diabetes in his mother and sister; Heart attack in his daughter; Heart disease in his brother, daughter, mother, and sister; Hyperlipidemia in his mother; Hypertension in his daughter, father, mother, and sister.    ROS:  Please see the history of present illness.   Otherwise, review of systems are positive for One in stool, abdominal pain, herniated and needs to be repaired, difficulty with balance, headache, and leg pain as noted..   All other systems are reviewed and negative.    PHYSICAL EXAM: VS:  BP 126/68 mmHg  Pulse 66  Ht 5\' 10"  (1.778 m)  Wt 157 lb (71.215 kg)  BMI 22.53 kg/m2 , BMI Body mass index is 22.53 kg/(m^2). GEN: Well nourished, well developed, in no acute distress HEENT: normal Neck: no JVD, carotid bruits, or masses Cardiac: RRR.  There is no murmur, rub, or gallop. There is no edema. Respiratory:  clear to auscultation bilaterally, normal work of breathing. GI: soft, nontender, nondistended, + BS MS: no deformity or atrophy Skin: warm and dry, no rash Neuro:  Strength and sensation are intact Psych: euthymic mood, full affect   EKG:  EKG is not ordered today.    Recent Labs: 03/27/2015: TSH 2.03 08/29/2015: ALT 9* 09/26/2015: BUN 60*; Creatinine, Ser 3.27*; Potassium 4.3; Sodium 143 10/06/2015: HGB 10.5*; Platelets 150    Lipid Panel    Component Value Date/Time   CHOL 104 12/25/2011 0416   TRIG 41 12/25/2011 0416   HDL 56 12/25/2011 0416   CHOLHDL 1.9 12/25/2011 0416   VLDL 8 12/25/2011 0416   LDLCALC 40 12/25/2011 0416      Wt Readings from Last 3 Encounters:  10/17/15 157 lb (71.215 kg)  09/26/15 162 lb 12.8 oz (73.846 kg)  09/08/15 165 lb 12.8 oz (75.206 kg)      Other studies Reviewed: Additional studies/ records that were reviewed today include: Daily  nephrology  note from Dr. Posey Pronto. The findings include stage IV-V CKD.    ASSESSMENT AND PLAN:  1. CAD of autologous bypass graft Stable without symptoms of ongoing angina  2. Chronic diastolic heart failure (HCC) No evidence of volume overload currently. This is influenced by left ventricular diastolic dysfunction and chronic kidney disease.  3. PAF (paroxysmal atrial fibrillation) (HCC) Clinically in sinus rhythm today. Amiodarone is being used for suppression.  4. CKD (chronic kidney disease), stage IV (HCC) Stage IV-V CK D followed by nephrology.  5. On amiodarone therapy No recent assessment of liver function or thyroid function.  6. Essential hypertension Controlled    Current medicines are reviewed at length with the patient today.  The patient has the following concerns regarding medicines: None.  The following changes/actions have been instituted:    Cleared for upcoming hernia surgery by Dr. Greer Pickerel  Okay to hold Plavix 7 days prior to the procedure  TSH and hepatic panel today and in 6 months.  Labs/ tests ordered today include:  No orders of the defined types were placed in this encounter.     Disposition:   FU with HS in 6 months  Signed, Sinclair Grooms, MD  10/17/2015 8:27 AM    [facility] [place], [place], [place]  [address] Phone: [phone]; Fax: [phone]

## 2015-10-20 ENCOUNTER — Encounter (HOSPITAL_COMMUNITY)
Admission: RE | Admit: 2015-10-20 | Discharge: 2015-10-20 | Disposition: A | Discharge status: Home / Self Care | Payer: Medicare Other | Source: Ambulatory Visit | Attending: General Surgery | Admitting: General Surgery

## 2015-10-20 ENCOUNTER — Encounter (HOSPITAL_COMMUNITY): Payer: Self-pay

## 2015-10-20 ENCOUNTER — Other Ambulatory Visit (HOSPITAL_BASED_OUTPATIENT_CLINIC_OR_DEPARTMENT_OTHER): Payer: Medicare Other

## 2015-10-20 ENCOUNTER — Ambulatory Visit (HOSPITAL_BASED_OUTPATIENT_CLINIC_OR_DEPARTMENT_OTHER): Payer: Medicare Other

## 2015-10-20 VITALS — BP 119/48 | HR 60 | Temp 98.2°F

## 2015-10-20 DIAGNOSIS — D469 Myelodysplastic syndrome, unspecified: Secondary | ICD-10-CM | POA: Diagnosis not present

## 2015-10-20 DIAGNOSIS — K4090 Unilateral inguinal hernia, without obstruction or gangrene, not specified as recurrent: Secondary | ICD-10-CM | POA: Diagnosis present

## 2015-10-20 DIAGNOSIS — E1151 Type 2 diabetes mellitus with diabetic peripheral angiopathy without gangrene: Secondary | ICD-10-CM | POA: Diagnosis not present

## 2015-10-20 DIAGNOSIS — I10 Essential (primary) hypertension: Secondary | ICD-10-CM | POA: Insufficient documentation

## 2015-10-20 DIAGNOSIS — I4891 Unspecified atrial fibrillation: Secondary | ICD-10-CM | POA: Insufficient documentation

## 2015-10-20 DIAGNOSIS — N189 Chronic kidney disease, unspecified: Secondary | ICD-10-CM | POA: Diagnosis not present

## 2015-10-20 DIAGNOSIS — E785 Hyperlipidemia, unspecified: Secondary | ICD-10-CM | POA: Diagnosis not present

## 2015-10-20 DIAGNOSIS — Z01812 Encounter for preprocedural laboratory examination: Secondary | ICD-10-CM | POA: Diagnosis not present

## 2015-10-20 LAB — CBC & DIFF AND RETIC
BASO%: 0.5 % (ref 0.0–2.0)
Basophils Absolute: 0 10*3/uL (ref 0.0–0.1)
EOS%: 4.7 % (ref 0.0–7.0)
Eosinophils Absolute: 0.2 10*3/uL (ref 0.0–0.5)
HEMATOCRIT: 28.4 % — AB (ref 38.4–49.9)
HGB: 9.1 g/dL — ABNORMAL LOW (ref 13.0–17.1)
Immature Retic Fract: 0.9 % — ABNORMAL LOW (ref 3.00–10.60)
LYMPH%: 24.6 % (ref 14.0–49.0)
MCH: 34.2 pg — AB (ref 27.2–33.4)
MCHC: 32 g/dL (ref 32.0–36.0)
MCV: 106.8 fL — AB (ref 79.3–98.0)
MONO#: 0.4 10*3/uL (ref 0.1–0.9)
MONO%: 10 % (ref 0.0–14.0)
NEUT#: 2.5 10*3/uL (ref 1.5–6.5)
NEUT%: 60.2 % (ref 39.0–75.0)
PLATELETS: 109 10*3/uL — AB (ref 140–400)
RBC: 2.66 10*6/uL — ABNORMAL LOW (ref 4.20–5.82)
RDW: 15.8 % — ABNORMAL HIGH (ref 11.0–14.6)
RETIC %: 0.43 % — AB (ref 0.80–1.80)
Retic Ct Abs: 11.44 10*3/uL — ABNORMAL LOW (ref 34.80–93.90)
WBC: 4.2 10*3/uL (ref 4.0–10.3)
lymph#: 1 10*3/uL (ref 0.9–3.3)

## 2015-10-20 LAB — BASIC METABOLIC PANEL
Anion gap: 9 (ref 5–15)
BUN: 81 mg/dL — AB (ref 6–20)
CALCIUM: 9.8 mg/dL (ref 8.9–10.3)
CHLORIDE: 106 mmol/L (ref 101–111)
CO2: 23 mmol/L (ref 22–32)
CREATININE: 3.72 mg/dL — AB (ref 0.61–1.24)
GFR calc Af Amer: 17 mL/min — ABNORMAL LOW (ref >60)
GFR calc non Af Amer: 14 mL/min — ABNORMAL LOW (ref >60)
GLUCOSE: 109 mg/dL — AB (ref 65–99)
Potassium: 5.3 mmol/L — ABNORMAL HIGH (ref 3.5–5.1)
Sodium: 138 mmol/L (ref 135–145)

## 2015-10-20 MED ORDER — DARBEPOETIN ALFA 200 MCG/0.4ML IJ SOSY
200.0000 ug | PREFILLED_SYRINGE | Freq: Once | INTRAMUSCULAR | Status: AC
  Administered 2015-10-20: 200 ug via SUBCUTANEOUS
  Filled 2015-10-20: qty 0.4

## 2015-10-20 NOTE — Progress Notes (Signed)
Abnormal CBC faxed to Dr.Wilson

## 2015-10-20 NOTE — Patient Instructions (Addendum)
YOUR PROCEDURE IS SCHEDULED ON :  10/28/15  REPORT TO [facility] MAIN ENTRANCE FOLLOW SIGNS TO EAST ELEVATOR - GO TO 3rd FLOOR CHECK IN AT 3 EAST NURSES STATION (SHORT STAY) AT:  9:30 AM  CALL THIS NUMBER IF YOU HAVE PROBLEMS THE MORNING OF SURGERY [phone]  REMEMBER:ONLY 1 PER PERSON MAY GO TO SHORT STAY WITH YOU TO GET READY THE MORNING OF YOUR SURGERY  DO NOT EAT FOOD OR DRINK LIQUIDS AFTER MIDNIGHT  TAKE THESE MEDICINES THE MORNING OF SURGERY: AMIODARONE / CARVIDILOL / CLONIDINE / HYDRALAZINE / ISOSORBIDED  STOP   IBUPROFEN / ALEVE / VITAMINS / HERBAL MEDS __5__ DAYS BEFORE SURGERY  YOU MAY NOT HAVE ANY METAL ON YOUR BODY INCLUDING HAIR PINS AND PIERCING'S. DO NOT WEAR JEWELRY, MAKEUP, LOTIONS, POWDERS OR PERFUMES. DO NOT WEAR NAIL POLISH. DO NOT SHAVE 48 HRS PRIOR TO SURGERY. MEN MAY SHAVE FACE AND NECK.  DO NOT [place]. [facility] IS NOT RESPONSIBLE FOR VALUABLES.  CONTACTS, DENTURES OR PARTIALS MAY NOT BE WORN TO SURGERY. LEAVE SUITCASE IN CAR. CAN BE BROUGHT TO ROOM AFTER SURGERY.  PATIENTS DISCHARGED THE DAY OF SURGERY WILL NOT BE ALLOWED TO DRIVE HOME.  PLEASE READ OVER THE FOLLOWING INSTRUCTION SHEETS _________________________________________________________________________________                                          [facility] - PREPARING FOR SURGERY  Before surgery, you can play an important role.  Because skin is not sterile, your skin needs to be as free of germs as possible.  You can reduce the number of germs on your skin by washing with CHG (chlorahexidine gluconate) soap before surgery.  CHG is an antiseptic cleaner which kills germs and bonds with the skin to continue killing germs even after washing. Please DO NOT use if you have an allergy to CHG or antibacterial soaps.  If your skin becomes reddened/irritated stop using the CHG and inform your nurse when you arrive at Short Stay. Do not shave (including legs  and underarms) for at least 48 hours prior to the first CHG shower.  You may shave your face. Please follow these instructions carefully:   1.  Shower with CHG Soap the night before surgery and the  morning of Surgery.   2.  If you choose to wash your hair, wash your hair first as usual with your  normal  Shampoo.   3.  After you shampoo, rinse your hair and body thoroughly to remove the  shampoo.                                         4.  Use CHG as you would any other liquid soap.  You can apply chg directly  to the skin and wash . Gently wash with scrungie or clean wascloth    5.  Apply the CHG Soap to your body ONLY FROM THE NECK DOWN.   Do not use on open                           Wound or open sores. Avoid contact with eyes, ears mouth and genitals (private parts).  Genitals (private parts) with your normal soap.              6.  Wash thoroughly, paying special attention to the area where your surgery  will be performed.   7.  Thoroughly rinse your body with warm water from the neck down.   8.  DO NOT shower/wash with your normal soap after using and rinsing off  the CHG Soap .                9.  Pat yourself dry with a clean towel.             10.  Wear clean night clothes to bed after shower             11.  Place clean sheets on your bed the night of your first shower and do not  sleep with pets.  Day of Surgery : Do not apply any lotions/deodorants the morning of surgery.  Please wear clean clothes to the hospital/surgery center.  FAILURE TO FOLLOW THESE INSTRUCTIONS MAY RESULT IN THE CANCELLATION OF YOUR SURGERY    PATIENT SIGNATURE_________________________________  ______________________________________________________________________

## 2015-10-21 NOTE — Progress Notes (Signed)
Abnormal BMET faxed to Dr.Wilson

## 2015-10-28 ENCOUNTER — Ambulatory Visit (HOSPITAL_COMMUNITY): Payer: Medicare Other | Admitting: Anesthesiology

## 2015-10-28 ENCOUNTER — Observation Stay (HOSPITAL_COMMUNITY)
Admission: RE | Admit: 2015-10-28 | Discharge: 2015-10-29 | Disposition: A | Discharge status: Home / Self Care | Payer: Medicare Other | Source: Ambulatory Visit | Attending: General Surgery | Admitting: General Surgery

## 2015-10-28 ENCOUNTER — Encounter (HOSPITAL_COMMUNITY)
Admission: RE | Disposition: A | Discharge status: Home / Self Care | Payer: Self-pay | Source: Ambulatory Visit | Attending: General Surgery

## 2015-10-28 ENCOUNTER — Encounter (HOSPITAL_COMMUNITY): Payer: Self-pay | Admitting: *Deleted

## 2015-10-28 DIAGNOSIS — I129 Hypertensive chronic kidney disease with stage 1 through stage 4 chronic kidney disease, or unspecified chronic kidney disease: Secondary | ICD-10-CM | POA: Diagnosis not present

## 2015-10-28 DIAGNOSIS — I252 Old myocardial infarction: Secondary | ICD-10-CM | POA: Diagnosis not present

## 2015-10-28 DIAGNOSIS — R351 Nocturia: Secondary | ICD-10-CM | POA: Diagnosis not present

## 2015-10-28 DIAGNOSIS — I509 Heart failure, unspecified: Secondary | ICD-10-CM | POA: Insufficient documentation

## 2015-10-28 DIAGNOSIS — N184 Chronic kidney disease, stage 4 (severe): Secondary | ICD-10-CM | POA: Insufficient documentation

## 2015-10-28 DIAGNOSIS — I480 Paroxysmal atrial fibrillation: Secondary | ICD-10-CM | POA: Diagnosis not present

## 2015-10-28 DIAGNOSIS — K219 Gastro-esophageal reflux disease without esophagitis: Secondary | ICD-10-CM | POA: Diagnosis not present

## 2015-10-28 DIAGNOSIS — Z951 Presence of aortocoronary bypass graft: Secondary | ICD-10-CM | POA: Insufficient documentation

## 2015-10-28 DIAGNOSIS — Z95828 Presence of other vascular implants and grafts: Secondary | ICD-10-CM | POA: Insufficient documentation

## 2015-10-28 DIAGNOSIS — Z79891 Long term (current) use of opiate analgesic: Secondary | ICD-10-CM | POA: Diagnosis not present

## 2015-10-28 DIAGNOSIS — E1151 Type 2 diabetes mellitus with diabetic peripheral angiopathy without gangrene: Secondary | ICD-10-CM | POA: Diagnosis not present

## 2015-10-28 DIAGNOSIS — N401 Enlarged prostate with lower urinary tract symptoms: Secondary | ICD-10-CM | POA: Diagnosis not present

## 2015-10-28 DIAGNOSIS — Z9889 Other specified postprocedural states: Secondary | ICD-10-CM

## 2015-10-28 DIAGNOSIS — Z87891 Personal history of nicotine dependence: Secondary | ICD-10-CM | POA: Insufficient documentation

## 2015-10-28 DIAGNOSIS — R3912 Poor urinary stream: Secondary | ICD-10-CM | POA: Insufficient documentation

## 2015-10-28 DIAGNOSIS — E7800 Pure hypercholesterolemia, unspecified: Secondary | ICD-10-CM | POA: Insufficient documentation

## 2015-10-28 DIAGNOSIS — M1990 Unspecified osteoarthritis, unspecified site: Secondary | ICD-10-CM | POA: Insufficient documentation

## 2015-10-28 DIAGNOSIS — D176 Benign lipomatous neoplasm of spermatic cord: Secondary | ICD-10-CM | POA: Diagnosis not present

## 2015-10-28 DIAGNOSIS — Z79899 Other long term (current) drug therapy: Secondary | ICD-10-CM | POA: Insufficient documentation

## 2015-10-28 DIAGNOSIS — Z7902 Long term (current) use of antithrombotics/antiplatelets: Secondary | ICD-10-CM | POA: Diagnosis not present

## 2015-10-28 DIAGNOSIS — I2510 Atherosclerotic heart disease of native coronary artery without angina pectoris: Secondary | ICD-10-CM | POA: Diagnosis not present

## 2015-10-28 DIAGNOSIS — D469 Myelodysplastic syndrome, unspecified: Secondary | ICD-10-CM | POA: Insufficient documentation

## 2015-10-28 DIAGNOSIS — D638 Anemia in other chronic diseases classified elsewhere: Secondary | ICD-10-CM | POA: Insufficient documentation

## 2015-10-28 DIAGNOSIS — K4090 Unilateral inguinal hernia, without obstruction or gangrene, not specified as recurrent: Secondary | ICD-10-CM | POA: Diagnosis not present

## 2015-10-28 DIAGNOSIS — Z8719 Personal history of other diseases of the digestive system: Secondary | ICD-10-CM

## 2015-10-28 HISTORY — PX: INSERTION OF MESH: SHX5868

## 2015-10-28 HISTORY — PX: INGUINAL HERNIA REPAIR: SHX194

## 2015-10-28 LAB — POCT I-STAT 4, (NA,K, GLUC, HGB,HCT)
GLUCOSE: 119 mg/dL — AB (ref 65–99)
HEMATOCRIT: 34 % — AB (ref 39.0–52.0)
Hemoglobin: 11.6 g/dL — ABNORMAL LOW (ref 13.0–17.0)
Potassium: 4.1 mmol/L (ref 3.5–5.1)
SODIUM: 141 mmol/L (ref 135–145)

## 2015-10-28 LAB — GLUCOSE, CAPILLARY
Glucose-Capillary: 122 mg/dL — ABNORMAL HIGH (ref 65–99)
Glucose-Capillary: 130 mg/dL — ABNORMAL HIGH (ref 65–99)

## 2015-10-28 SURGERY — REPAIR, HERNIA, INGUINAL, ADULT
Anesthesia: General | Laterality: Right

## 2015-10-28 MED ORDER — CLONIDINE HCL 0.1 MG PO TABS
0.1000 mg | ORAL_TABLET | Freq: Two times a day (BID) | ORAL | Status: DC
  Administered 2015-10-29: 0.1 mg via ORAL
  Filled 2015-10-28 (×2): qty 1

## 2015-10-28 MED ORDER — FENTANYL CITRATE (PF) 100 MCG/2ML IJ SOLN: 25.0000 ug | INTRAMUSCULAR | Status: DC | PRN

## 2015-10-28 MED ORDER — AMIODARONE HCL 100 MG PO TABS
100.0000 mg | ORAL_TABLET | Freq: Every day | ORAL | Status: DC
  Administered 2015-10-29: 100 mg via ORAL
  Filled 2015-10-28: qty 1

## 2015-10-28 MED ORDER — BUPIVACAINE-EPINEPHRINE (PF) 0.25% -1:200000 IJ SOLN
INTRAMUSCULAR | Status: AC
  Filled 2015-10-28: qty 30

## 2015-10-28 MED ORDER — SODIUM CHLORIDE 0.9 % IV SOLN
20.0000 mg | INTRAVENOUS | Status: DC | PRN
  Administered 2015-10-28: 50 ug/min via INTRAVENOUS

## 2015-10-28 MED ORDER — SPIRONOLACTONE 25 MG PO TABS
25.0000 mg | ORAL_TABLET | Freq: Every day | ORAL | Status: DC
  Administered 2015-10-28 – 2015-10-29 (×2): 25 mg via ORAL
  Filled 2015-10-28 (×2): qty 1

## 2015-10-28 MED ORDER — CARVEDILOL 25 MG PO TABS
25.0000 mg | ORAL_TABLET | Freq: Two times a day (BID) | ORAL | Status: DC
  Administered 2015-10-28 – 2015-10-29 (×2): 25 mg via ORAL
  Filled 2015-10-28 (×2): qty 1

## 2015-10-28 MED ORDER — ONDANSETRON HCL 4 MG/2ML IJ SOLN: 4.0000 mg | Freq: Four times a day (QID) | INTRAMUSCULAR | Status: DC | PRN

## 2015-10-28 MED ORDER — FENTANYL CITRATE (PF) 100 MCG/2ML IJ SOLN
INTRAMUSCULAR | Status: DC | PRN
  Administered 2015-10-28 (×2): 50 ug via INTRAVENOUS

## 2015-10-28 MED ORDER — ONDANSETRON HCL 4 MG/2ML IJ SOLN
INTRAMUSCULAR | Status: AC
  Filled 2015-10-28: qty 2

## 2015-10-28 MED ORDER — ACETAMINOPHEN 10 MG/ML IV SOLN
INTRAVENOUS | Status: AC
  Filled 2015-10-28: qty 100

## 2015-10-28 MED ORDER — FENTANYL CITRATE (PF) 100 MCG/2ML IJ SOLN
INTRAMUSCULAR | Status: AC
  Filled 2015-10-28: qty 2

## 2015-10-28 MED ORDER — ACETAMINOPHEN 500 MG PO TABS
1000.0000 mg | ORAL_TABLET | Freq: Four times a day (QID) | ORAL | Status: DC
  Administered 2015-10-28 – 2015-10-29 (×3): 1000 mg via ORAL
  Filled 2015-10-28 (×3): qty 2

## 2015-10-28 MED ORDER — HEPARIN SODIUM (PORCINE) 5000 UNIT/ML IJ SOLN
5000.0000 [IU] | Freq: Three times a day (TID) | INTRAMUSCULAR | Status: DC
  Administered 2015-10-29: 5000 [IU] via SUBCUTANEOUS
  Filled 2015-10-28: qty 1

## 2015-10-28 MED ORDER — MORPHINE SULFATE (PF) 2 MG/ML IV SOLN: 1.0000 mg | INTRAVENOUS | Status: DC | PRN

## 2015-10-28 MED ORDER — ONDANSETRON HCL 4 MG/2ML IJ SOLN: 4.0000 mg | Freq: Once | INTRAMUSCULAR | Status: DC | PRN

## 2015-10-28 MED ORDER — ONDANSETRON 4 MG PO TBDP: 4.0000 mg | ORAL_TABLET | Freq: Four times a day (QID) | ORAL | Status: DC | PRN

## 2015-10-28 MED ORDER — EPHEDRINE SULFATE 50 MG/ML IJ SOLN
INTRAMUSCULAR | Status: DC | PRN
  Administered 2015-10-28: 5 mg via INTRAVENOUS
  Administered 2015-10-28: 10 mg via INTRAVENOUS
  Administered 2015-10-28: 15 mg via INTRAVENOUS

## 2015-10-28 MED ORDER — LIDOCAINE HCL (CARDIAC) 20 MG/ML IV SOLN
INTRAVENOUS | Status: DC | PRN
  Administered 2015-10-28: 100 mg via INTRAVENOUS

## 2015-10-28 MED ORDER — DIPHENHYDRAMINE HCL 12.5 MG/5ML PO ELIX: 12.5000 mg | ORAL_SOLUTION | Freq: Four times a day (QID) | ORAL | Status: DC | PRN

## 2015-10-28 MED ORDER — BUPIVACAINE-EPINEPHRINE (PF) 0.5% -1:200000 IJ SOLN
INTRAMUSCULAR | Status: AC
  Filled 2015-10-28: qty 30

## 2015-10-28 MED ORDER — BUPIVACAINE-EPINEPHRINE (PF) 0.5% -1:200000 IJ SOLN
INTRAMUSCULAR | Status: DC | PRN
  Administered 2015-10-28: 25 mL

## 2015-10-28 MED ORDER — CEFAZOLIN SODIUM-DEXTROSE 2-4 GM/100ML-% IV SOLN
INTRAVENOUS | Status: AC
  Filled 2015-10-28: qty 100

## 2015-10-28 MED ORDER — OXYCODONE HCL 5 MG PO TABS
5.0000 mg | ORAL_TABLET | ORAL | Status: DC | PRN
  Administered 2015-10-29: 5 mg via ORAL
  Filled 2015-10-28 (×2): qty 1

## 2015-10-28 MED ORDER — PHENYLEPHRINE HCL 10 MG/ML IJ SOLN
INTRAMUSCULAR | Status: DC | PRN
  Administered 2015-10-28: 120 ug via INTRAVENOUS
  Administered 2015-10-28: 80 ug via INTRAVENOUS

## 2015-10-28 MED ORDER — POTASSIUM CHLORIDE IN NACL 20-0.9 MEQ/L-% IV SOLN
INTRAVENOUS | Status: DC
  Administered 2015-10-28: 15:00:00 via INTRAVENOUS
  Filled 2015-10-28: qty 1000

## 2015-10-28 MED ORDER — ACETAMINOPHEN 10 MG/ML IV SOLN
1000.0000 mg | Freq: Once | INTRAVENOUS | Status: AC
  Administered 2015-10-28: 1000 mg via INTRAVENOUS

## 2015-10-28 MED ORDER — PROPOFOL 10 MG/ML IV BOLUS
INTRAVENOUS | Status: DC | PRN
  Administered 2015-10-28: 140 mg via INTRAVENOUS

## 2015-10-28 MED ORDER — TAMSULOSIN HCL 0.4 MG PO CAPS
0.4000 mg | ORAL_CAPSULE | Freq: Every day | ORAL | Status: DC
  Administered 2015-10-28: 0.4 mg via ORAL
  Filled 2015-10-28: qty 1

## 2015-10-28 MED ORDER — PANTOPRAZOLE SODIUM 40 MG IV SOLR
40.0000 mg | Freq: Every day | INTRAVENOUS | Status: DC
  Administered 2015-10-28: 40 mg via INTRAVENOUS
  Filled 2015-10-28: qty 40

## 2015-10-28 MED ORDER — DIPHENHYDRAMINE HCL 50 MG/ML IJ SOLN: 12.5000 mg | Freq: Four times a day (QID) | INTRAMUSCULAR | Status: DC | PRN

## 2015-10-28 MED ORDER — BUPIVACAINE-EPINEPHRINE 0.25% -1:200000 IJ SOLN
INTRAMUSCULAR | Status: DC | PRN
  Administered 2015-10-28: 13 mL

## 2015-10-28 MED ORDER — LACTATED RINGERS IV SOLN
INTRAVENOUS | Status: DC | PRN
  Administered 2015-10-28: 10:00:00 via INTRAVENOUS

## 2015-10-28 MED ORDER — CEFAZOLIN SODIUM-DEXTROSE 2-4 GM/100ML-% IV SOLN
2.0000 g | INTRAVENOUS | Status: AC
  Administered 2015-10-28: 2 g via INTRAVENOUS

## 2015-10-28 MED ORDER — ISOSORBIDE MONONITRATE ER 30 MG PO TB24
120.0000 mg | ORAL_TABLET | Freq: Every day | ORAL | Status: DC
Start: 2015-10-29 — End: 2015-10-29
  Administered 2015-10-29: 120 mg via ORAL
  Filled 2015-10-28: qty 4

## 2015-10-28 MED ORDER — SIMETHICONE 80 MG PO CHEW: 40.0000 mg | CHEWABLE_TABLET | Freq: Four times a day (QID) | ORAL | Status: DC | PRN

## 2015-10-28 MED ORDER — PROPOFOL 10 MG/ML IV BOLUS
INTRAVENOUS | Status: AC
  Filled 2015-10-28: qty 20

## 2015-10-28 MED ORDER — FUROSEMIDE 40 MG PO TABS
120.0000 mg | ORAL_TABLET | Freq: Two times a day (BID) | ORAL | Status: DC
  Administered 2015-10-28 – 2015-10-29 (×2): 120 mg via ORAL
  Filled 2015-10-28 (×2): qty 3

## 2015-10-28 MED ORDER — LIDOCAINE HCL (CARDIAC) 20 MG/ML IV SOLN
INTRAVENOUS | Status: AC
  Filled 2015-10-28: qty 5

## 2015-10-28 MED ORDER — HYDRALAZINE HCL 50 MG PO TABS
50.0000 mg | ORAL_TABLET | Freq: Three times a day (TID) | ORAL | Status: DC
  Administered 2015-10-28 – 2015-10-29 (×2): 50 mg via ORAL
  Filled 2015-10-28 (×3): qty 1

## 2015-10-28 SURGICAL SUPPLY — 23 items
BENZOIN TINCTURE PRP APPL 2/3 (GAUZE/BANDAGES/DRESSINGS) ×3 IMPLANT
CLOSURE WOUND 1/2 X4 (GAUZE/BANDAGES/DRESSINGS) ×1
COVER SURGICAL LIGHT HANDLE (MISCELLANEOUS) ×3 IMPLANT
DRAIN PENROSE 18X1/2 LTX STRL (DRAIN) ×3 IMPLANT
DRAPE LAPAROTOMY T 98X78 PEDS (DRAPES) ×3 IMPLANT
DRSG TEGADERM 4X4.75 (GAUZE/BANDAGES/DRESSINGS) ×3 IMPLANT
ELECT REM PT RETURN 9FT ADLT (ELECTROSURGICAL) ×3
ELECTRODE REM PT RTRN 9FT ADLT (ELECTROSURGICAL) ×1 IMPLANT
GLOVE BIO SURGEON STRL SZ7.5 (GLOVE) ×3 IMPLANT
GLOVE INDICATOR 8.0 STRL GRN (GLOVE) ×3 IMPLANT
GOWN STRL REUS W/TWL XL LVL3 (GOWN DISPOSABLE) ×6 IMPLANT
KIT BASIN OR (CUSTOM PROCEDURE TRAY) ×3 IMPLANT
MESH ULTRAPRO 3X6 7.6X15CM (Mesh General) ×3 IMPLANT
NEEDLE HYPO 22GX1.5 SAFETY (NEEDLE) ×3 IMPLANT
PACK GENERAL/GYN (CUSTOM PROCEDURE TRAY) ×3 IMPLANT
STRIP CLOSURE SKIN 1/2X4 (GAUZE/BANDAGES/DRESSINGS) ×2 IMPLANT
SUT MNCRL AB 4-0 PS2 18 (SUTURE) ×3 IMPLANT
SUT PROLENE 2 0 CT2 30 (SUTURE) ×6 IMPLANT
SUT VIC AB 2-0 SH 27 (SUTURE) ×2
SUT VIC AB 2-0 SH 27X BRD (SUTURE) ×1 IMPLANT
SUT VIC AB 3-0 SH 18 (SUTURE) ×3 IMPLANT
SYR CONTROL 10ML LL (SYRINGE) ×3 IMPLANT
TOWEL OR 17X26 10 PK STRL BLUE (TOWEL DISPOSABLE) ×3 IMPLANT

## 2015-10-28 NOTE — H&P (Signed)
[name] Location: [facility] Patient #: [id number] DOB: 1935/09/01 Married / Language: English / Race: Black or African American Male   History of Present Illness  Patient words: hernia.  The patient is a 80 year old male who presents with an inguinal hernia. He is referred by Dr Olevia Bowens for evaluation of a right inguinal hernia. He is accompanied by his wife and son. He was in the hospital from February 17 through the 19th for abdominal pain & a questionable lower GI bleed. He states he has had a hernia or known about it for a few months. It comes and goes. However about 2 weeks ago he had an episode where it was uncomfortable and hard to push back in. He denies any nausea or vomiting. He denies any diarrhea or constipation. He does have some dyspnea on exertion, and some occasional orthopnea. However he denies shortness of breath at rest and paroxysmal nocturnal dyspnea. He denies any edema. He does have a history of heart failure. He is on Plavix. He has stage IV chronic kidney disease. He does have a history of anemia. His most recent hemoglobin was 7.8. 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 He does have some nocturia and some trouble initiating his stream. He is on a BPH medication   Problem List/Past Medical  PAROXYSMAL ATRIAL FIBRILLATION (I48.0) CHRONIC ANTICOAGULATION (Z79.01) RIGHT INGUINAL HERNIA (K40.90) CHRONIC KIDNEY DISEASE, STAGE 4 (SEVERE) (N18.4)  Other Problems  Arthritis Atrial Fibrillation Back Pain Chest pain Diabetes Mellitus Enlarged Prostate Gastric Ulcer Gastroesophageal Reflux Disease High blood pressure Hypercholesterolemia Myocardial infarction Pancreatitis Prostate Cancer Vascular Disease Congestive Heart Failure  Past Surgical History  Bypass Surgery for Poor Blood Flow to Legs Coronary Artery Bypass Graft Gallbladder Surgery - Open Resection of  Stomach  Diagnostic Studies History  Colonoscopy 5-10 years ago  Allergies  ACE Inhibitors NSAIDs Ibuprofen *ANALGESICS - ANTI-INFLAMMATORY*  Medication History Amiodarone HCl (200MG  Tablet, Oral) Active. Hydrocodone-Acetaminophen (5-325MG  Tablet, Oral as needed) Active. Atorvastatin Calcium (80MG  Tablet, Oral) Active. AmLODIPine Besylate (10MG  Tablet, Oral) Active. Carvedilol (25MG  Tablet, Oral) Active. CloNIDine HCl (0.2MG  Tablet, Oral) Active. Clopidogrel Bisulfate (75MG  Tablet, Oral) Active. Furosemide (80MG  Tablet, Oral) Active. Isosorbide Mononitrate ER (120MG  Tablet ER 24HR, Oral) Active. Klor-Con M20 [facility] Tablet ER, Oral) Active. Pantoprazole Sodium (40MG  Tablet DR, Oral) Active. HydrALAZINE HCl (50MG  Tablet, Oral) Active. Rapaflo (8MG  Capsule, Oral) Active. Medications Reconciled  Social History Alcohol use Remotely quit alcohol use. Caffeine use Carbonated beverages, Coffee. No drug use Tobacco use Former smoker.  Family History Breast Cancer Sister. Cerebrovascular Accident Son. Colon Cancer Father. Diabetes Mellitus Daughter, Mother, Son. Heart Disease Son. Hypertension Mother.    Review of Systems General Not Present- Appetite Loss, Chills, Fatigue, Fever, Night Sweats, Weight Gain and Weight Loss. Skin Not Present- Change in Wart/Mole, Dryness, Hives, Jaundice, New Lesions, Non-Healing Wounds, Rash and Ulcer. HEENT Not Present- Earache, Hearing Loss, Hoarseness, Nose Bleed, Oral Ulcers, Ringing in the Ears, Seasonal Allergies, Sinus Pain, Sore Throat, Visual Disturbances, Wears glasses/contact lenses and Yellow Eyes. Respiratory Not Present- Bloody sputum, Chronic Cough, Difficulty Breathing, Snoring and Wheezing. Breast Not Present- Breast Mass, Breast Pain, Nipple Discharge and Skin Changes. Cardiovascular Present- Palpitations and Shortness of Breath. Not Present- Chest Pain, Difficulty Breathing Lying Down, Leg Cramps,  Rapid Heart Rate and Swelling of Extremities. Gastrointestinal Present- Abdominal Pain, Bloating and Hemorrhoids. Not Present- Bloody Stool, Change in Bowel Habits, Chronic diarrhea, Constipation, Difficulty Swallowing, Excessive gas, Gets full quickly  at meals, Indigestion, Nausea, Rectal Pain and Vomiting. Male Genitourinary Present- Impotence and Nocturia. Not Present- Blood in Urine, Change in Urinary Stream, Frequency, Painful Urination, Urgency and Urine Leakage.  Vitals 09/04/2015 4:21 PM Weight: 166 lb Height: 70in Body Surface Area: 1.93 m Body Mass Index: 23.82 kg/m  Temp.: 58F(Temporal)  Pulse: 75 (Regular)  BP: 126/80 (Sitting, Left Arm, Standard)       Physical Exam  General Mental Status-Alert. General Appearance-Consistent with stated age. Hydration-Well hydrated. Voice-Normal. Note: Slightly frail in appearance   Head and Neck Head-normocephalic, atraumatic with no lesions or palpable masses. Trachea-midline. Thyroid Gland Characteristics - normal size and consistency.  Eye Eyeball - Bilateral-Extraocular movements intact. Sclera/Conjunctiva - Bilateral-No scleral icterus.  Chest and Lung Exam Chest and lung exam reveals -quiet, even and easy respiratory effort with no use of accessory muscles and on auscultation, normal breath sounds, no adventitious sounds and normal vocal resonance. Inspection Chest Wall - Chest Wall - Inspection of the chest wall reveals - Note: Healed sternal incision. Back - normal.  Breast - Did not examine.  Cardiovascular Cardiovascular examination reveals -normal heart sounds, regular rate and rhythm with no murmurs and normal pedal pulses bilaterally.  Abdomen Inspection Inspection of the abdomen reveals - No Hernias. Skin - Scar - Note: Has midline incision no incisional hernia. Palpation/Percussion Palpation and Percussion of the abdomen reveal - Soft, Non Tender, No Rebound tenderness,  No Rigidity (guarding) and No hepatosplenomegaly. Auscultation Auscultation of the abdomen reveals - Bowel sounds normal.  Male Genitourinary Note: Both testicles descended. No scrotal masses. Obvious right small groin bulge. Easily reducible. No left bulge or enlarged ring with Valsalva   Peripheral Vascular Upper Extremity Palpation - Pulses bilaterally normal.  Neurologic Neurologic evaluation reveals -alert and oriented x 3 with no impairment of recent or remote memory. Mental Status-Normal.  Neuropsychiatric The patient's mood and affect are described as -normal. Judgment and Insight-insight is appropriate concerning matters relevant to self.  Musculoskeletal Normal Exam - Left-Upper Extremity Strength Normal and Lower Extremity Strength Normal. Normal Exam - Right-Upper Extremity Strength Normal and Lower Extremity Strength Normal.  Lymphatic Head & Neck  General Head & Neck Lymphatics: Bilateral - Description - Normal. Axillary - Did not examine. Femoral & Inguinal - Did not examine.    Assessment & Plan RIGHT INGUINAL HERNIA (K40.90) Impression: We discussed the etiology of inguinal hernias. We discussed the signs & symptoms of incarceration & strangulation. We discussed non-operative and operative management.  The patient has elected to proceed with open repair of right inguinal hernia with mesh. He understands that we need to obtain cardiology clearance for surgery in addition to permission to holding his blood thinner perioperatively before scheduling surgery   I described the procedure in detail. The patient was given educational material. We discussed the risks and benefits including but not limited to bleeding, infection, chronic inguinal pain, nerve entrapment, hernia recurrence, mesh complications, hematoma formation, urinary retention, injury to the testicles, numbness in the groin, blood clots, injury to the surrounding structures, and anesthesia  risk. We also discussed the typical post operative recovery course, including no heavy lifting for 4-6 weeks. I explained that the likelihood of improvement of their symptoms is good  I did discuss with him that he is at slightly higher risk for bleeding, hematoma, urinary retention, perioperative cardiac and pulmonary events given his underlying medical issues. Current Plans I recommended obtaining preoperative cardiac clearance. I am concerned about the health of the patient and the ability to  tolerate the operation. Therefore, we will request clearance by cardiology to better assess operative risk & see if a reevaluation, further workup, etc is needed. Also recommendations on how medications such as for anticoagulation and blood pressure should be managed/held/restarted after surgery.  Our office will contact you to schedule surgery after we have recieved cardiology clearance  Pt Education - Pamphlet Given - Hernia Surgery: discussed with patient and provided information. CHRONIC CONGESTIVE HEART FAILURE, UNSPECIFIED CONGESTIVE HEART FAILURE TYPE (I50.9) PAROXYSMAL ATRIAL FIBRILLATION (I48.0) CHRONIC ANTICOAGULATION (Z79.01) CHRONIC KIDNEY DISEASE, STAGE 4 (SEVERE) (N18.4)  Leighton Ruff. Redmond Pulling, MD, FACS General, Bariatric, & Minimally Invasive Surgery [facility], [place]

## 2015-10-28 NOTE — Anesthesia Preprocedure Evaluation (Addendum)
Anesthesia Evaluation  Patient identified by MRN, date of birth, ID band Patient awake    Reviewed: Allergy & Precautions, NPO status , Patient's Chart, lab work & pertinent test results  History of Anesthesia Complications Negative for: history of anesthetic complications  Airway Mallampati: II  TM Distance: >3 FB Neck ROM: Full    Dental no notable dental hx. (+) Dental Advisory Given   Pulmonary former smoker,    Pulmonary exam normal breath sounds clear to auscultation       Cardiovascular hypertension, Pt. on medications + CAD, + Past MI, + CABG, + Peripheral Vascular Disease and +CHF  Normal cardiovascular exam+ dysrhythmias (SSS) + pacemaker  Rhythm:Regular Rate:Normal     Neuro/Psych PSYCHIATRIC DISORDERS Anxiety Depression negative neurological ROS     GI/Hepatic negative GI ROS, Neg liver ROS, GERD  Medicated and Controlled,  Endo/Other  diabetes  Renal/GU Renal disease  negative genitourinary   Musculoskeletal  (+) Arthritis ,   Abdominal   Peds negative pediatric ROS (+)  Hematology Myelodysplastic syndrome   Anesthesia Other Findings   Reproductive/Obstetrics negative OB ROS                            Anesthesia Physical Anesthesia Plan  ASA: III  Anesthesia Plan: General   Post-op Pain Management: GA combined w/ Regional for post-op pain   Induction: Intravenous  Airway Management Planned: LMA  Additional Equipment:   Intra-op Plan:   Post-operative Plan: Extubation in OR  Informed Consent: I have reviewed the patients History and Physical, chart, labs and discussed the procedure including the risks, benefits and alternatives for the proposed anesthesia with the patient or authorized representative who has indicated his/her understanding and acceptance.   Dental advisory given  Plan Discussed with: CRNA  Anesthesia Plan Comments:         Anesthesia  Quick Evaluation

## 2015-10-28 NOTE — Anesthesia Postprocedure Evaluation (Signed)
Anesthesia Post Note  Patient: [name] RAUDABAUGH Sr.  Procedure(s) Performed: Procedure(s) (LRB): OPEN RIGHT INGUINAL HERNIA REPAIR (Right) INSERTION OF MESH (Right)  Patient location during evaluation: PACU Anesthesia Type: General Level of consciousness: awake and alert Pain management: pain level controlled Vital Signs Assessment: post-procedure vital signs reviewed and stable Respiratory status: spontaneous breathing, nonlabored ventilation, respiratory function stable and patient connected to nasal cannula oxygen Cardiovascular status: blood pressure returned to baseline and stable Postop Assessment: no signs of nausea or vomiting Anesthetic complications: no    Last Vitals:  Filed Vitals:   10/28/15 1330 10/28/15 1400  BP: 186/71 142/86  Pulse: 59 60  Temp: 36.9 C   Resp: 16 16    Last Pain:  Filed Vitals:   10/28/15 1440  PainSc: Asleep                 Aryiah Monterosso JENNETTE

## 2015-10-28 NOTE — Anesthesia Procedure Notes (Addendum)
Anesthesia Regional Block:  TAP block  Pre-Anesthetic Checklist: ,, timeout performed, Correct Patient, Correct Site, Correct Laterality, Correct Procedure, Correct Position, site marked, Risks and benefits discussed,  Surgical consent,  Pre-op evaluation,  At surgeon's request and post-op pain management  Laterality: Right  Prep: Maximum Sterile Barrier Precautions used and chloraprep       Needles:  Injection technique: Single-shot  Needle Type: Echogenic Stimulator Needle     Needle Length: 10cm 10 cm Needle Gauge: 21 and 21 G    Additional Needles:  Procedures: ultrasound guided (picture in chart) TAP block Narrative:  Injection made incrementally with aspirations every 5 mL.  Performed by: Personally  Anesthesiologist: Lauretta Grill  Additional Notes: Patient tolerated the procedure well without complications   Procedure Name: LMA Insertion Date/Time: 10/28/2015 10:35 AM Performed by: Danley Danker L Patient Re-evaluated:Patient Re-evaluated prior to inductionOxygen Delivery Method: Circle system utilized Preoxygenation: Pre-oxygenation with 100% oxygen Intubation Type: IV induction Ventilation: Mask ventilation without difficulty LMA: LMA inserted LMA Size: 4.0 Number of attempts: 1 Placement Confirmation: positive ETCO2 and breath sounds checked- equal and bilateral Tube secured with: Tape Dental Injury: Teeth and Oropharynx as per pre-operative assessment

## 2015-10-28 NOTE — Transfer of Care (Signed)
Immediate Anesthesia Transfer of Care Note  Patient: [name] Marble Sr.  Procedure(s) Performed: Procedure(s): OPEN RIGHT INGUINAL HERNIA REPAIR (Right) INSERTION OF MESH (Right)  Patient Location: PACU  Anesthesia Type:General  Level of Consciousness: awake  Airway & Oxygen Therapy: Patient Spontanous Breathing and Patient connected to face mask oxygen  Post-op Assessment: Report given to RN and Post -op Vital signs reviewed and stable  Post vital signs: Reviewed and stable  Last Vitals:  Filed Vitals:   10/28/15 0906  BP: 153/61  Pulse: 62  Temp: 36.6 C  Resp: 18    Complications: No apparent anesthesia complications

## 2015-10-28 NOTE — Progress Notes (Signed)
AssistedDr. [name] with right, ultrasound guided, transabdominal plane block. Side rails up, monitors on throughout procedure. See vital signs in flow sheet. Tolerated Procedure well.

## 2015-10-28 NOTE — Interval H&P Note (Signed)
History and Physical Interval Note:  10/28/2015 10:11 AM  [name] D Bobbye Charleston Sr.  has presented today for surgery, with the diagnosis of right inguinal hernia  The various methods of treatment have been discussed with the patient and family. After consideration of risks, benefits and other options for treatment, the patient has consented to  Procedure(s): OPEN RIGHT INGUINAL HERNIA REPAIR (Right) INSERTION OF MESH (Right) as a surgical intervention .  The patient's history has been reviewed, patient examined, no change in status, stable for surgery.  I have reviewed the patient's chart and labs.  Questions were answered to the patient's satisfaction.    Leighton Ruff. Redmond Pulling, MD, [place], Bariatric, & Minimally Invasive Surgery [facility], [place]    [facility]

## 2015-10-28 NOTE — Op Note (Signed)
[name] BQ:[id number] 1935-11-04 10/28/2015  Open Right Indirect Inguinal Hernia Repair with Mesh Procedure Note  Indications: The patient presented with a history of a right, reducible hernia.    Pre-operative Diagnosis: right reducible inguinal hernia  Post-operative Diagnosis: right indirect inguinal hernia  Surgeon: [name]   Assistants: none  Anesthesia: General LMA anesthesia and TAPP block  Surgeon: Leighton Ruff. Redmond Pulling, MD, FACS  Procedure Details  The patient was seen again in the Holding Room. The risks, benefits, complications, treatment options, and expected outcomes were discussed with the patient. The possibilities of reaction to medication, pulmonary aspiration, perforation of viscus, bleeding, recurrent infection, the need for additional procedures, and development of a complication requiring transfusion or further operation were discussed with the patient and/or family. The likelihood of success in repairing the hernia and returning the patient to their previous functional status is good.  There was concurrence with the proposed plan, and informed consent was obtained. The site of surgery was properly noted/marked. The patient was taken to the Operating Room, identified as [place]., and the procedure verified as right inguinal hernia repair. A Time Out was held and the above information confirmed. Anesthesia placed a right TAPP block preoperatively.   The patient was placed in the supine position and underwent induction of anesthesia. The lower abdomen and groin was prepped with Chloraprep and draped in the standard fashion, and 0.25% Marcaine with epinephrine was used to anesthetize the skin over the mid-portion of the inguinal canal. An oblique incision was made. Dissection was carried down through the subcutaneous tissue with cautery to the external oblique fascia.  We opened the external oblique fascia along the direction of its fibers to the external  ring.  The external oblique fascia was a little tethered to the conjoined tendon but I was able to develop a plane and lift it up. The spermatic cord was circumferentially dissected bluntly and retracted with a Penrose drain.  The floor of the inguinal canal was inspected & there was no direct defect.  We skeletonized the spermatic cord and separated a cord lipoma and hernia sac. The cord lipoma and sac were reduced back into the abdominal cavity.  We used a 3 x 6 inch piece of Ultrapro mesh, which was cut into a keyhole shape.  This was secured with 2-0 Prolene, beginning at the pubic tubercle, running this along the shelving edge inferiorly. Superiorly, the mesh was secured to the internal oblique fascia with interrupted 2-0 Prolene sutures.  The tails of the mesh were sutured together behind the spermatic cord.  The mesh was tucked underneath the external oblique fascia laterally.  The external oblique fascia was reapproximated with 2-0 Vicryl.  3-0 Vicryl was used to close the subcutaneous tissues and 4-0 Monocryl was used to close the skin in subcuticular fashion.  Benzoin and steri-strips were used to seal the incision.  A clean dressing was applied.  The patient was then extubated and brought to the recovery room in stable condition.  All sponge, instrument, and needle counts were correct prior to closure and at the conclusion of the case.   Estimated Blood Loss: Minimal                 Complications: None; patient tolerated the procedure well.         Disposition: PACU - hemodynamically stable.         Condition: stable  Leighton Ruff. Redmond Pulling, MD, FACS General, Bariatric, & Minimally Invasive Surgery  [facility] [facility], [place]

## 2015-10-29 DIAGNOSIS — K4090 Unilateral inguinal hernia, without obstruction or gangrene, not specified as recurrent: Secondary | ICD-10-CM | POA: Diagnosis not present

## 2015-10-29 LAB — BASIC METABOLIC PANEL
ANION GAP: 10 (ref 5–15)
BUN: 63 mg/dL — AB (ref 6–20)
CALCIUM: 8.9 mg/dL (ref 8.9–10.3)
CO2: 25 mmol/L (ref 22–32)
Chloride: 102 mmol/L (ref 101–111)
Creatinine, Ser: 3.34 mg/dL — ABNORMAL HIGH (ref 0.61–1.24)
GFR calc Af Amer: 19 mL/min — ABNORMAL LOW (ref >60)
GFR, EST NON AFRICAN AMERICAN: 16 mL/min — AB (ref >60)
GLUCOSE: 107 mg/dL — AB (ref 65–99)
Potassium: 3.8 mmol/L (ref 3.5–5.1)
Sodium: 137 mmol/L (ref 135–145)

## 2015-10-29 LAB — CBC
HCT: 31.6 % — ABNORMAL LOW (ref 39.0–52.0)
Hemoglobin: 10.4 g/dL — ABNORMAL LOW (ref 13.0–17.0)
MCH: 34.8 pg — AB (ref 26.0–34.0)
MCHC: 32.9 g/dL (ref 30.0–36.0)
MCV: 105.7 fL — AB (ref 78.0–100.0)
PLATELETS: 161 10*3/uL (ref 150–400)
RBC: 2.99 MIL/uL — ABNORMAL LOW (ref 4.22–5.81)
RDW: 15.9 % — AB (ref 11.5–15.5)
WBC: 8.8 10*3/uL (ref 4.0–10.5)

## 2015-10-29 MED ORDER — CLOPIDOGREL BISULFATE 75 MG PO TABS: ORAL_TABLET | ORAL | Status: DC

## 2015-10-29 MED ORDER — OXYCODONE HCL 5 MG PO TABS
5.0000 mg | ORAL_TABLET | ORAL | Status: DC | PRN
Start: 2015-10-29 — End: 2015-11-07

## 2015-10-29 NOTE — Progress Notes (Signed)
Patient d/c home with wife,stable. Discharge instructions given, teach back utilized. Pain is controlled. Teachings reinforced on when to take Plavix-11/03/15, verbalized understanding.

## 2015-10-29 NOTE — Discharge Instructions (Signed)
[facility]  UMBILICAL OR INGUINAL HERNIA REPAIR: POST OP INSTRUCTIONS  Always review your discharge instruction sheet given to you by the facility where your surgery was performed. IF YOU HAVE DISABILITY OR FAMILY LEAVE FORMS, YOU MUST BRING THEM TO THE OFFICE FOR PROCESSING.   DO NOT GIVE THEM TO YOUR DOCTOR.  1. A  prescription for pain medication may be given to you upon discharge.  Take your pain medication as prescribed, if needed.  If narcotic pain medicine is not needed, then you may take acetaminophen (Tylenol) or ibuprofen (Advil) as needed. 2. Take your usually prescribed medications unless otherwise directed. 3. If you need a refill on your pain medication, please contact your pharmacy.  They will contact our office to request authorization. Prescriptions will not be filled after 5 pm or on week-ends. 4. You should follow a light diet the first 24 hours after arrival home, such as soup and crackers, etc.  Be sure to include lots of fluids daily.  Resume your normal diet the day after surgery. 5. Most patients will experience some swelling and bruising around the umbilicus or in the groin and scrotum.  Ice packs and reclining will help.  Swelling and bruising can take several days to resolve.  6. It is common to experience some constipation if taking pain medication after surgery.  Increasing fluid intake and taking a stool softener (such as Colace) will usually help or prevent this problem from occurring.  A mild laxative (Milk of Magnesia or Miralax) should be taken according to package directions if there are no bowel movements after 48 hours. 7. Unless discharge instructions indicate otherwise, you may remove your bandages48 hours after surgery, and you may shower at that time.  You  have steri-strips (small skin tapes) in place directly over the incision.  These strips should be left on the skin for 7-10 days.  I 8. ACTIVITIES:  You may resume regular (light) daily  activities beginning the next day--such as daily self-care, walking, climbing stairs--gradually increasing activities as tolerated.  You may have sexual intercourse when it is comfortable.  Refrain from any heavy lifting or straining until approved by your doctor. a. You may drive when you are no longer taking prescription pain medication, you can comfortably wear a seatbelt, and you can safely maneuver your car and apply brakes. b. RETURN TO WORK:  9. You should see your doctor in the office for a follow-up appointment approximately 2-3 weeks after your surgery.  Make sure that you call for this appointment within a day or two after you arrive home to insure a convenient appointment time. 10. OTHER INSTRUCTIONS: RESUME PLAVIX ON 4/24    WHEN TO CALL YOUR DOCTOR: 1. Fever over 101.0 2. Inability to urinate 3. Nausea and/or vomiting 4. Extreme swelling or bruising 5. Continued bleeding from incision. 6. Increased pain, redness, or drainage from the incision  The clinic staff is available to answer your questions during regular business hours.  Please dont hesitate to call and ask to speak to one of the nurses for clinical concerns.  If you have a medical emergency, go to the nearest emergency room or call 911.  A surgeon from [facility] is always on call at the hospital   [address], [place], [place], [place]  [address] ?  [address], [place], [place]   [address] [phone] ? [phone] ? FAX [phone] Web site: [email or web address]

## 2015-11-03 ENCOUNTER — Other Ambulatory Visit: Payer: Medicare Other

## 2015-11-03 ENCOUNTER — Ambulatory Visit: Payer: Medicare Other

## 2015-11-07 ENCOUNTER — Ambulatory Visit (INDEPENDENT_AMBULATORY_CARE_PROVIDER_SITE_OTHER): Payer: Medicare Other | Admitting: Internal Medicine

## 2015-11-07 ENCOUNTER — Encounter: Payer: Self-pay | Admitting: Internal Medicine

## 2015-11-07 ENCOUNTER — Encounter: Payer: Self-pay | Admitting: Nurse Practitioner

## 2015-11-07 VITALS — BP 112/56 | HR 57 | Ht 70.0 in | Wt 156.2 lb

## 2015-11-07 DIAGNOSIS — I739 Peripheral vascular disease, unspecified: Secondary | ICD-10-CM

## 2015-11-07 DIAGNOSIS — Z79899 Other long term (current) drug therapy: Secondary | ICD-10-CM

## 2015-11-07 DIAGNOSIS — I495 Sick sinus syndrome: Secondary | ICD-10-CM | POA: Diagnosis not present

## 2015-11-07 DIAGNOSIS — I480 Paroxysmal atrial fibrillation: Secondary | ICD-10-CM | POA: Diagnosis not present

## 2015-11-07 LAB — HEPATIC FUNCTION PANEL
ALT: 5 U/L — ABNORMAL LOW (ref 9–46)
AST: 16 U/L (ref 10–35)
Albumin: 4.4 g/dL (ref 3.6–5.1)
Alkaline Phosphatase: 39 U/L — ABNORMAL LOW (ref 40–115)
BILIRUBIN DIRECT: 0.1 mg/dL (ref <=0.2)
BILIRUBIN INDIRECT: 0.3 mg/dL (ref 0.2–1.2)
BILIRUBIN TOTAL: 0.4 mg/dL (ref 0.2–1.2)
Total Protein: 7.8 g/dL (ref 6.1–8.1)

## 2015-11-07 LAB — CBC WITH DIFFERENTIAL/PLATELET
BASOS PCT: 1 %
Basophils Absolute: 47 cells/uL (ref 0–200)
EOS PCT: 5 %
Eosinophils Absolute: 235 cells/uL (ref 15–500)
HCT: 30.5 % — ABNORMAL LOW (ref 38.5–50.0)
HEMOGLOBIN: 9.9 g/dL — AB (ref 13.2–17.1)
LYMPHS ABS: 1316 {cells}/uL (ref 850–3900)
Lymphocytes Relative: 28 %
MCH: 33.2 pg — ABNORMAL HIGH (ref 27.0–33.0)
MCHC: 32.5 g/dL (ref 32.0–36.0)
MCV: 102.3 fL — ABNORMAL HIGH (ref 80.0–100.0)
MONO ABS: 470 {cells}/uL (ref 200–950)
MPV: 9.3 fL (ref 7.5–12.5)
Monocytes Relative: 10 %
NEUTROS ABS: 2632 {cells}/uL (ref 1500–7800)
NEUTROS PCT: 56 %
Platelets: 160 10*3/uL (ref 140–400)
RBC: 2.98 MIL/uL — AB (ref 4.20–5.80)
RDW: 15.9 % — ABNORMAL HIGH (ref 11.0–15.0)
WBC: 4.7 10*3/uL (ref 3.8–10.8)

## 2015-11-07 LAB — BASIC METABOLIC PANEL
BUN: 96 mg/dL — ABNORMAL HIGH (ref 7–25)
CALCIUM: 10.2 mg/dL (ref 8.6–10.3)
CO2: 23 mmol/L (ref 20–31)
Chloride: 100 mmol/L (ref 98–110)
Creat: 5.79 mg/dL — ABNORMAL HIGH (ref 0.70–1.18)
GLUCOSE: 120 mg/dL — AB (ref 65–99)
POTASSIUM: 5.1 mmol/L (ref 3.5–5.3)
SODIUM: 137 mmol/L (ref 135–146)

## 2015-11-07 LAB — TSH: TSH: 4.26 m[IU]/L (ref 0.40–4.50)

## 2015-11-07 NOTE — Progress Notes (Signed)
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 is a 80 y.o. male with a h/o sinus bradycardia sp PPM (SJM) by Dr Leonia Reeves who presents today to followup in the Electrophysiology device clinic.  The patient has multiple chronic medical issues.  He is on ASA and plavix.  He is not on anticoagulation for afib due to prior GI bleeding.  He recently had hernia repair and has not done well since.  He is very "tired". He also reports dizziness. Today, he  denies symptoms of palpitations, chest pain, shortness of breath, orthopnea, PND,  presyncope, syncope, or neurologic sequela.  His edema is stable.  The patientis tolerating medications without difficulties and is otherwise without complaint today.   Past Medical History  Diagnosis Date  . Hypertension   . Hyperlipidemia   . GERD (gastroesophageal reflux disease)   . DVT (deep venous thrombosis) ([place]) 2011    Right arm  . Atrial fibrillation ([place])   . Gout   . Depressive disorder   . Internal hemorrhoids without mention of complication   . Gastroparesis   . Osteopenia   . Ischemic colitis ([place])   . Peripheral artery disease ([place])   . Chronic diastolic heart failure ([place])   . 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 Renal artery stenosis ([place])   . CKD (chronic kidney disease)   . Vitamin B 12 deficiency   . CAD (coronary artery disease)     s/b CABG 1994, and subsequent stents. Repeat CABG 12/2011,  . Prostate cancer ([place]) 1997    XRT and lupron  . Myelodysplastic syndrome ([place]) 05/22/2013    With low hemoglobin and platelets treated with Procrit  . Angiomyolipoma 2009    On both kidneys noted in 2009  . Sick sinus syndrome ([place])   . Peptic ulcer     S/p partial gastrectomy in 1969  . Arthritis     neck and left wrist  . History of hiatal hernia   . Diabetes mellitus type 2 with peripheral artery disease ([place])     DIET CONTROLLED   Past Surgical History  Procedure Laterality Date  . Hiatal hernia repair        and ulcer repair  . Appendectomy  1991  . Pacemaker insertion  10/18/1994    DDD pacemaker, [employer]. Gen change 12/10/2003.  Marland Kitchen Coronary artery bypass graft  01/22/1993  . Cholecystectomy  Oct 2009    Laparoscopic  . Coronary artery bypass graft  01/03/2012    Procedure: REDO CORONARY ARTERY BYPASS GRAFTING (CABG);  Surgeon: Gaye Pollack, MD;  Location: [facility];  Service: Open Heart Surgery;  Laterality: N/A;  Redo CABG x  using bilateral internal mammary arteries;  left leg greater saphenous vein harvested endoscopically  . Celiac artery angioplasty  05-16-12    and stenting  . Partial gastrectomy  1969    Hx of ulcer s/p partial gastrectomy/ has pernicious anemia  . Other surgical history  02/13/13    superior mesenteric artery angiogram  . Thrombectomy / embolectomy subclavian artery  02/02/10    Right subclavian thromboectomy and venous angioplasty, and chronic mesenteric ischemia with Herculink stenting to superior mesenteric and celiac arteries - Dr. Trula Slade  . Other surgical history  05/16/12    Stent in stomach  . Pacemaker generator change  12/10/2003    SJM Identity XL DR performed by Dr Leonia Reeves  . Visceral angiogram N/A 05/25/2011    Procedure: VISCERAL ANGIOGRAM;  Surgeon:  Serafina Mitchell, MD;  Location: [facility] CATH LAB;  Service: Cardiovascular;  Laterality: N/A;  . Abdominal angiogram N/A 05/25/2011    Procedure: ABDOMINAL ANGIOGRAM;  Surgeon: Serafina Mitchell, MD;  Location: [facility] CATH LAB;  Service: Cardiovascular;  Laterality: N/A;  . Lower extremity angiogram Bilateral 05/25/2011    Procedure: LOWER EXTREMITY ANGIOGRAM;  Surgeon: Serafina Mitchell, MD;  Location: [facility] CATH LAB;  Service: Cardiovascular;  Laterality: Bilateral;  . Left heart catheterization with coronary/graft angiogram  12/24/2011    Procedure: LEFT HEART CATHETERIZATION WITH Beatrix Fetters;  Surgeon: Sinclair Grooms, MD;  Location: [facility] CATH LAB;  Service: Cardiovascular;;  . Visceral angiogram Bilateral  12/28/2011    Procedure: VISCERAL ANGIOGRAM;  Surgeon: Serafina Mitchell, MD;  Location: [facility] CATH LAB;  Service: Cardiovascular;  Laterality: Bilateral;  . Visceral angiogram N/A 05/16/2012    Procedure: VISCERAL ANGIOGRAM;  Surgeon: Serafina Mitchell, MD;  Location: [facility] CATH LAB;  Service: Cardiovascular;  Laterality: N/A;  . Visceral angiogram N/A 02/13/2013    Procedure: VISCERAL ANGIOGRAM;  Surgeon: Serafina Mitchell, MD;  Location: [facility] CATH LAB;  Service: Cardiovascular;  Laterality: N/A;  . Renal angiogram N/A 02/13/2013    Procedure: RENAL ANGIOGRAM;  Surgeon: Serafina Mitchell, MD;  Location: [facility] CATH LAB;  Service: Cardiovascular;  Laterality: N/A;  . Abdominal angiogram N/A 02/13/2013    Procedure: ABDOMINAL ANGIOGRAM;  Surgeon: Serafina Mitchell, MD;  Location: [facility] CATH LAB;  Service: Cardiovascular;  Laterality: N/A;  . Inguinal hernia repair Right 10/28/2015    Procedure: OPEN RIGHT INGUINAL HERNIA REPAIR;  Surgeon: Greer Pickerel, MD;  Location: [facility] ORS;  Service: General;  Laterality: Right;  . Insertion of mesh Right 10/28/2015    Procedure: INSERTION OF MESH;  Surgeon: Greer Pickerel, MD;  Location: [facility] ORS;  Service: General;  Laterality: Right;    Social History   Social History  . Marital Status: Married    Spouse Name: N/A  . Number of Children: N/A  . Years of Education: N/A   Occupational History  . Not on file.   Social History Main Topics  . Smoking status: Former Smoker -- 20 years    Types: Cigarettes    Quit date: 07/12/1969  . Smokeless tobacco: Never Used  . Alcohol Use: No  . Drug Use: No  . Sexual Activity: Yes   Other Topics Concern  . Not on file   Social History Narrative    Family History  Problem Relation Age of Onset  . Diabetes Mother   . Heart disease Mother     Heart Disease before age 48  . Hyperlipidemia Mother   . Hypertension Mother   . CVA Mother 66    cause of death  . Cancer Father     stomach/liver  . Hypertension Father     possibly hypertensive    . Cancer Sister     Breast cancer  . Heart disease Daughter     Heart Disease before age 85  . Hypertension Daughter   . Heart attack Daughter   . CAD Brother   . Heart disease Brother   . Cancer Paternal Uncle     colon  . Heart disease Sister   . Diabetes Sister   . Hypertension Sister     Allergies  Allergen Reactions  . Nsaids Other (See Comments)    GI issue  . Ibuprofen Other (See Comments)    GI Issues  . Ace Inhibitors Cough  Current Outpatient Prescriptions  Medication Sig Dispense Refill  . acetaminophen (ARTHRITIS PAIN RELIEF) 650 MG CR tablet Take 1,300 mg by mouth 3 (three) times daily.     Marland Kitchen amiodarone (PACERONE) 200 MG tablet Take one-half tablet by  mouth daily 45 tablet 0  . aspirin 81 MG tablet Take 1 tablet (81 mg total) by mouth daily.    Marland Kitchen atorvastatin (LIPITOR) 80 MG tablet Take 80 mg by mouth at bedtime.     . Calcifediol ER (RAYALDEE) 30 MCG CPCR Take 30 mcg by mouth at bedtime.    Marland Kitchen CALCIUM-VITAMIN D PO Take 1 capsule by mouth 2 (two) times daily.    . carvedilol (COREG) 25 MG tablet TAKE 1 TABLET BY MOUTH  TWICE DAILY WITH MEALS 180 tablet 0  . cloNIDine (CATAPRES) 0.1 MG tablet Take 0.1 mg by mouth 2 (two) times daily.    . clopidogrel (PLAVIX) 75 MG tablet Take 1 tablet by mouth  daily 90 tablet 0  . cyanocobalamin (,VITAMIN B-12,) 1000 MCG/ML injection Inject 1,000 mcg into the muscle every 30 (thirty) days. Vitamin B12 - last injection 09/01/15    . epoetin alfa (EPOGEN,PROCRIT) 09811 UNIT/ML injection Inject 10,000 Units into the skin every 14 (fourteen) days. Done at [facility] - last injection 09/22/15    . furosemide (LASIX) 80 MG tablet Take 80 mg by mouth 2 (two) times daily. Pt takes 1 and 1/2 tablet daily. (140 mg) total    . hydrALAZINE (APRESOLINE) 50 MG tablet Take 1 tablet (50 mg total) by mouth 3 (three) times daily. 90 tablet 3  . isosorbide mononitrate (IMDUR) 120 MG 24 hr tablet Take 1 tablet by mouth  daily 90 tablet 0  .  leuprolide (LUPRON) 30 MG injection Inject 30 mg into the muscle every 4 (four) months.    . Multiple Vitamin (MULTIVITAMIN WITH MINERALS) TABS tablet Take 1 tablet by mouth every morning.    . potassium chloride SA (K-DUR,KLOR-CON) 20 MEQ tablet Take 20-40 mEq by mouth 3 (three) times daily. Take 2 tablets (40 meq) by mouth with breakfast, take 1 tablet (20 meq) with lunch and supper    . PRESCRIPTION MEDICATION Supportive Therapy [facility]    . silodosin (RAPAFLO) 8 MG CAPS capsule Take 8 mg by mouth at bedtime.     Marland Kitchen spironolactone (ALDACTONE) 25 MG tablet Take 25 mg by mouth daily.     No current facility-administered medications for this visit.    ROS- all systems are reviewed and negative except as per HPI  Physical Exam: Filed Vitals:   11/07/15 1406  BP: 112/56  Pulse: 57  Height: 5\' 10"  (1.778 m)  Weight: 156 lb 3.2 oz (70.852 kg)    GEN- The patient is chronically ill appearing, alert and oriented x 3 today.   Head- normocephalic, atraumatic Eyes-  Sclera clear, conjunctiva pink Ears- hearing intact Oropharynx- clear Neck- supple  Lungs- Clear to ausculation bilaterally, normal work of breathing Chest- pacemaker pocket is well healed Heart- Regular rate and rhythm  GI- soft, NT, ND, + BS Extremities- no clubbing, cyanosis, + edema MS- age appropriate atrophy Skin- no rash or lesion Psych- euthymic mood, full affect Neuro- strength and sensation are intact  Pacemaker interrogation- reviewed in detail today,  See PACEART report  Assessment and Plan:  1. Sick sinus syndrome Normal pacemaker function See Pace Art report No changes today  2. Paroxysmal atrial fibrillation chads2vasc score is at least 5.  He is not on anticoagulation due  to bleeding concerns.  We did discuss Watchman today.  As he has had no afib in the past 6 months, he prefers conservative management at this time. I will defer this decision to Dr Tamala Julian who knows the patient well. Continue  amiodarone Check lts, tfts  3. CAD/PVD No ischemic symptoms Stable No change required today Given difficulty with anemia/ bleeding, I worry that he is at increased risks on plavix.  Will defer decision about stopping plavix to Dr Tamala Julian who knows him more comprehensively than I.  4. HTN Stable No change required today  Return to the device clinic in 6 months Return to see EP NP in 1 year Follow-up with Dr Tamala Julian as scheduled  Thompson Grayer MD, [facility] 11/07/2015 2:29 PM

## 2015-11-07 NOTE — Patient Instructions (Signed)
Medication Instructions:  Your physician recommends that you continue on your current medications as directed. Please refer to the Current Medication list given to you today.   Labwork: Your physician recommends that you return for lab work: BMP/CBC/Liver/TSH    Testing/Procedures: None ordered   Follow-Up: Your physician wants you to follow-up in: 6 months with device clinic(same day as Dr Tamala Julian Appointment) and 12 months with Dr Rayann Heman Dennis Bast will receive a reminder letter in the mail two months in advance. If you don't receive a letter, please call our office to schedule the follow-up appointment.   Any Other Special Instructions Will Be Listed Below (If Applicable).     If you need a refill on your cardiac medications before your next appointment, please call your pharmacy.

## 2015-11-13 NOTE — Discharge Summary (Signed)
Physician Discharge Summary  [name] [id number] DOB: 1935/09/03 DOA: 10/28/2015  PCP: [name], MD  Admit date: 10/28/2015 Discharge date: 10/29/2015  Recommendations for Outpatient Follow-up:   Follow-up Information    Follow up with [name], MD. Schedule an appointment as soon as possible for a visit in 3 weeks.   Specialty:  General Surgery   Why:  For wound re-check   Contact information:   [address] [place] [address] [phone]       Follow up with [name], MD. Schedule an appointment as soon as possible for a visit in 2 weeks.   Specialty:  Family Medicine   Why:  medical checkup   Contact information:   [place] [address] [phone]      Discharge Diagnoses:  Right inguina hernia s/p repair Chronic heart failure Chronic kidney disease HTN H/o PAF DM Anemia of chronic disease  Surgical Procedure: open repair of right inguinal hernia with mesh  Discharge Condition: good Disposition: home  Diet recommendation: cardiac  Filed Weights   10/28/15 0945 10/28/15 1711  Weight: 73.256 kg (161 lb 8 oz) 71.215 kg (157 lb)    History of present illness: The patient is a 80 year old male who presents with an inguinal hernia. He is referred by Dr Olevia Bowens for evaluation of a right inguinal hernia. He is accompanied by his wife and son. He was in the hospital from February 17 through the 19th for abdominal pain & a questionable lower GI bleed. He states he has had a hernia or known about it for a few months. It comes and goes. However about 2 weeks ago he had an episode where it was uncomfortable and hard to push back in. He denies any nausea or vomiting. He denies any diarrhea or constipation. He does have some dyspnea on exertion, and some occasional orthopnea. However he denies shortness of breath at rest and paroxysmal nocturnal dyspnea. He denies any edema. He does have a  history of heart failure. He is on Plavix. He has stage IV chronic kidney disease. He does have a history of anemia. His most recent hemoglobin was 7.8. 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 He does have some nocturia and some trouble initiating his stream. He is on a BPH medication   Hospital Course:  He came in for planned procedure. He was kept overnight for observation given his medical history. He did well. He did not have any urinary retention. On POD 1 he was tolerating a diet, his pain was controlled. His vitals were stable. He had ambulated. There were no signs of heart failure. It was felt he was safe for dc. I discussed dc instructions with him.  BP 99/53 mmHg  Pulse 56  Temp(Src) 98.3 F (36.8 C) (Oral)  Resp 16  Ht 5\' 10"  (1.778 m)  Wt 71.215 kg (157 lb)  BMI 22.53 kg/m2  SpO2 99%  Gen: alert, NAD, non-toxic appearing Pupils: equal, no scleral icterus Pulm: Lungs clear to auscultation, symmetric chest rise CV: regular rate and rhythm Abd: soft, nontender, nondistended.  No cellulitis. No incisional hernia Ext: no edema, no calf tenderness Skin: no rash, no jaundice    Discharge Instructions  Discharge Instructions    Call MD for:  hives    Complete by:  As directed      Call MD for:  persistant dizziness or light-headedness    Complete  by:  As directed      Call MD for:  persistant nausea and vomiting    Complete by:  As directed      Call MD for:  redness, tenderness, or signs of infection (pain, swelling, redness, odor or green/yellow discharge around incision site)    Complete by:  As directed      Call MD for:  severe uncontrolled pain    Complete by:  As directed      Call MD for:    Complete by:  As directed   Temperature >101     Diet - low sodium heart healthy    Complete by:  As directed      Discharge instructions    Complete by:  As directed   See [facility] discharge instructions     Increase activity slowly     Complete by:  As directed             Medication List    STOP taking these medications        furosemide 80 MG tablet  Commonly known as:  LASIX      TAKE these medications        amiodarone 200 MG tablet  Commonly known as:  PACERONE  Take one-half tablet by  mouth daily     ARTHRITIS PAIN RELIEF 650 MG CR tablet  Generic drug:  acetaminophen  Take 1,300 mg by mouth 3 (three) times daily.     aspirin 81 MG tablet  Take 1 tablet (81 mg total) by mouth daily.     atorvastatin 80 MG tablet  Commonly known as:  LIPITOR  Take 80 mg by mouth at bedtime.     CALCIUM-VITAMIN D PO  Take 1 capsule by mouth 2 (two) times daily.     carvedilol 25 MG tablet  Commonly known as:  COREG  TAKE 1 TABLET BY MOUTH  TWICE DAILY WITH MEALS     cloNIDine 0.1 MG tablet  Commonly known as:  CATAPRES  Take 0.1 mg by mouth 2 (two) times daily.     clopidogrel 75 MG tablet  Commonly known as:  PLAVIX  Take 1 tablet by mouth  daily     cyanocobalamin 1000 MCG/ML injection  Commonly known as:  (VITAMIN B-12)  Inject 1,000 mcg into the muscle every 30 (thirty) days. Vitamin B12 - last injection 09/01/15     epoetin alfa 10000 UNIT/ML injection  Commonly known as:  EPOGEN,PROCRIT  Inject 10,000 Units into the skin every 14 (fourteen) days. Done at [facility] - last injection 09/22/15     hydrALAZINE 50 MG tablet  Commonly known as:  APRESOLINE  Take 1 tablet (50 mg total) by mouth 3 (three) times daily.     isosorbide mononitrate 120 MG 24 hr tablet  Commonly known as:  IMDUR  Take 1 tablet by mouth  daily     leuprolide 30 MG injection  Commonly known as:  LUPRON  Inject 30 mg into the muscle every 4 (four) months.     multivitamin with minerals Tabs tablet  Take 1 tablet by mouth every morning.     potassium chloride SA 20 MEQ tablet  Commonly known as:  K-DUR,KLOR-CON  Take 20-40 mEq by mouth 3 (three) times daily. Take 2 tablets (40 meq) by mouth with breakfast, take  1 tablet (20 meq) with lunch and supper     [place] 30  MCG Cpcr  Generic drug:  Calcifediol ER  Take 30 mcg by mouth at bedtime.     silodosin 8 MG Caps capsule  Commonly known as:  RAPAFLO  Take 8 mg by mouth at bedtime.     spironolactone 25 MG tablet  Commonly known as:  ALDACTONE  Take 25 mg by mouth daily.           Follow-up Information    Follow up with [name], MD. Schedule an appointment as soon as possible for a visit in 3 weeks.   Specialty:  General Surgery   Why:  For wound re-check   Contact information:   [address] [place] [address] [phone]       Follow up with [name], MD. Schedule an appointment as soon as possible for a visit in 2 weeks.   Specialty:  Family Medicine   Why:  medical checkup   Contact information:   [place] [address] [phone]        The results of significant diagnostics from this hospitalization (including imaging, microbiology, ancillary and laboratory) are listed below for reference.    Significant Diagnostic Studies: No results found.  Microbiology: No results found for this or any previous visit (from the past 240 hour(s)).   Labs: Basic Metabolic Panel:  BMP Latest Ref Rng  10/29/2015 10/28/2015  Glucose 65 - 99 mg/dL  107(H) 119(H)  BUN 7 - 25 mg/dL  63(H) -  Creatinine 0.70 - 1.18 mg/dL  3.34(H) -  Sodium 135 - 146 mmol/L  137 141  Potassium 3.5 - 5.3 mmol/L  3.8 4.1  Chloride 98 - 110 mmol/L  102 -  CO2 20 - 31 mmol/L  25 -  Calcium 8.6 - 10.3 mg/dL  8.9 -    CBC Latest Ref Rng  10/29/2015 10/28/2015  WBC 3.8 - 10.8 K/uL  8.8 -  Hemoglobin 13.2 - 17.1 g/dL  10.4(L) 11.6(L)  Hematocrit 38.5 - 50.0 %  31.6(L) 34.0(L)  Platelets 140 - 400 K/uL  161 -      Active Problems:   S/P hernia repair   Time coordinating discharge: 15 min  Signed:  [name], MD [facility]  Surgery, [place] [phone] 10/29/2015, 7:31 AM

## 2015-11-17 ENCOUNTER — Encounter: Payer: Self-pay | Admitting: *Deleted

## 2015-11-17 ENCOUNTER — Encounter: Payer: Self-pay | Admitting: Hematology and Oncology

## 2015-11-17 ENCOUNTER — Other Ambulatory Visit (HOSPITAL_BASED_OUTPATIENT_CLINIC_OR_DEPARTMENT_OTHER): Payer: Medicare Other

## 2015-11-17 ENCOUNTER — Ambulatory Visit: Payer: Medicare Other

## 2015-11-17 DIAGNOSIS — D469 Myelodysplastic syndrome, unspecified: Secondary | ICD-10-CM

## 2015-11-17 LAB — CBC & DIFF AND RETIC
BASO%: 0.3 % (ref 0.0–2.0)
Basophils Absolute: 0 10*3/uL (ref 0.0–0.1)
EOS ABS: 0.2 10*3/uL (ref 0.0–0.5)
EOS%: 6.6 % (ref 0.0–7.0)
HCT: 31.4 % — ABNORMAL LOW (ref 38.4–49.9)
HEMOGLOBIN: 10.1 g/dL — AB (ref 13.0–17.1)
IMMATURE RETIC FRACT: 1.5 % — AB (ref 3.00–10.60)
LYMPH#: 1.2 10*3/uL (ref 0.9–3.3)
LYMPH%: 35.2 % (ref 14.0–49.0)
MCH: 33.8 pg — ABNORMAL HIGH (ref 27.2–33.4)
MCHC: 32.2 g/dL (ref 32.0–36.0)
MCV: 105 fL — ABNORMAL HIGH (ref 79.3–98.0)
MONO#: 0.3 10*3/uL (ref 0.1–0.9)
MONO%: 7.7 % (ref 0.0–14.0)
NEUT%: 50.2 % (ref 39.0–75.0)
NEUTROS ABS: 1.8 10*3/uL (ref 1.5–6.5)
Platelets: 135 10*3/uL — ABNORMAL LOW (ref 140–400)
RBC: 2.99 10*6/uL — AB (ref 4.20–5.82)
RDW: 15.4 % — AB (ref 11.0–14.6)
RETIC CT ABS: 14.65 10*3/uL — AB (ref 34.80–93.90)
Retic %: 0.49 % — ABNORMAL LOW (ref 0.80–1.80)
WBC: 3.5 10*3/uL — AB (ref 4.0–10.3)

## 2015-11-17 NOTE — Progress Notes (Signed)
Reviewed portal and printed documents. Sent physician form via [place] to have [place] sign. Signature obtained. I signed agreement on behalf of patient. Faxed all forms and copy of insurance card to [employer]. Fax received ok per confirmation sheet.

## 2015-11-17 NOTE — Progress Notes (Signed)
Pt dropped off a letter for Dr. Alvy Bimler requesting information regarding his diagnosis be sent to the [employer].  I gave letter to [facility] in [place] to process for pt.Marland Kitchen

## 2015-11-17 NOTE — Progress Notes (Signed)
Patient returned my call to gather information for [employer] application. I completed the application online via portal. I will continue to check the portal if I don't receive any correspondence on the status of his application. I advised patient I will contact him once I hear back from [employer]. Patient verbalized understanding and thanked me.

## 2015-11-17 NOTE — Progress Notes (Signed)
C[place] RN came and brought letter from patient in regards to applying for [employer] copay assistance. Called patient and left message to return my call to collect information for the application. Introduced myself as Estate manager/land agent and left my name and return number.

## 2015-11-17 NOTE — Progress Notes (Signed)
Labs today had Hemoglobin  Of 10.1, no injection needed. Pt instructed to call for issues or concerns and to follow current schedule. Pt verbalized understanding.

## 2015-11-24 ENCOUNTER — Other Ambulatory Visit: Payer: Medicare Other

## 2015-11-24 ENCOUNTER — Ambulatory Visit: Payer: Medicare Other | Admitting: Hematology and Oncology

## 2015-11-24 ENCOUNTER — Ambulatory Visit: Payer: Medicare Other

## 2015-11-28 ENCOUNTER — Encounter: Payer: Self-pay | Admitting: Hematology and Oncology

## 2015-11-28 NOTE — Progress Notes (Signed)
Checked portal for approval.Patient approved for co-pay assistance through [employer] for $5,000 11/10/15-11/08/2016. Emailed copy of award letter and POE to Ashlei/Bobbi in billing.

## 2015-12-01 ENCOUNTER — Other Ambulatory Visit (HOSPITAL_BASED_OUTPATIENT_CLINIC_OR_DEPARTMENT_OTHER): Payer: Medicare Other

## 2015-12-01 ENCOUNTER — Ambulatory Visit (HOSPITAL_BASED_OUTPATIENT_CLINIC_OR_DEPARTMENT_OTHER): Payer: Medicare Other

## 2015-12-01 VITALS — BP 124/56 | HR 60 | Temp 98.2°F | Resp 18

## 2015-12-01 DIAGNOSIS — D469 Myelodysplastic syndrome, unspecified: Secondary | ICD-10-CM | POA: Diagnosis not present

## 2015-12-01 LAB — CBC & DIFF AND RETIC
BASO%: 0.4 % (ref 0.0–2.0)
BASOS ABS: 0 10*3/uL (ref 0.0–0.1)
EOS ABS: 0.2 10*3/uL (ref 0.0–0.5)
EOS%: 3.8 % (ref 0.0–7.0)
HEMATOCRIT: 30 % — AB (ref 38.4–49.9)
HGB: 9.8 g/dL — ABNORMAL LOW (ref 13.0–17.1)
IMMATURE RETIC FRACT: 2.2 % — AB (ref 3.00–10.60)
LYMPH#: 1.1 10*3/uL (ref 0.9–3.3)
LYMPH%: 23.9 % (ref 14.0–49.0)
MCH: 33.4 pg (ref 27.2–33.4)
MCHC: 32.7 g/dL (ref 32.0–36.0)
MCV: 102.4 fL — ABNORMAL HIGH (ref 79.3–98.0)
MONO#: 0.5 10*3/uL (ref 0.1–0.9)
MONO%: 10.6 % (ref 0.0–14.0)
NEUT#: 2.8 10*3/uL (ref 1.5–6.5)
NEUT%: 61.3 % (ref 39.0–75.0)
PLATELETS: 88 10*3/uL — AB (ref 140–400)
RBC: 2.93 10*6/uL — ABNORMAL LOW (ref 4.20–5.82)
RDW: 15.2 % — AB (ref 11.0–14.6)
RETIC CT ABS: 4.98 10*3/uL — AB (ref 34.80–93.90)
WBC: 4.5 10*3/uL (ref 4.0–10.3)

## 2015-12-01 MED ORDER — DARBEPOETIN ALFA 200 MCG/0.4ML IJ SOSY
200.0000 ug | PREFILLED_SYRINGE | Freq: Once | INTRAMUSCULAR | Status: AC
  Administered 2015-12-01: 200 ug via SUBCUTANEOUS
  Filled 2015-12-01: qty 0.4

## 2015-12-01 NOTE — Patient Instructions (Signed)
Darbepoetin Alfa injection What is this medicine? DARBEPOETIN ALFA (dar be POE e tin AL fa) helps your body make more red blood cells. It is used to treat anemia caused by chronic kidney failure and chemotherapy. This medicine may be used for other purposes; ask your health care provider or pharmacist if you have questions. What should I tell my health care provider before I take this medicine? They need to know if you have any of these conditions: -blood clotting disorders or history of blood clots -cancer patient not on chemotherapy -cystic fibrosis -heart disease, such as angina, heart failure, or a history of a heart attack -hemoglobin level of 12 g/dL or greater -high blood pressure -low levels of folate, iron, or vitamin B12 -seizures -an unusual or allergic reaction to darbepoetin, erythropoietin, albumin, hamster proteins, latex, other medicines, foods, dyes, or preservatives -pregnant or trying to get pregnant -breast-feeding How should I use this medicine? This medicine is for injection into a vein or under the skin. It is usually given by a health care professional in a hospital or clinic setting. If you get this medicine at home, you will be taught how to prepare and give this medicine. Do not shake the solution before you withdraw a dose. Use exactly as directed. Take your medicine at regular intervals. Do not take your medicine more often than directed. It is important that you put your used needles and syringes in a special sharps container. Do not put them in a trash can. If you do not have a sharps container, call your pharmacist or healthcare provider to get one. Talk to your pediatrician regarding the use of this medicine in children. While this medicine may be used in children as young as 1 year for selected conditions, precautions do apply. Overdosage: If you think you have taken too much of this medicine contact a poison control center or emergency room at once. NOTE:  This medicine is only for you. Do not share this medicine with others. What if I miss a dose? If you miss a dose, take it as soon as you can. If it is almost time for your next dose, take only that dose. Do not take double or extra doses. What may interact with this medicine? Do not take this medicine with any of the following medications: -epoetin alfa This list may not describe all possible interactions. Give your health care provider a list of all the medicines, herbs, non-prescription drugs, or dietary supplements you use. Also tell them if you smoke, drink alcohol, or use illegal drugs. Some items may interact with your medicine. What should I watch for while using this medicine? Visit your prescriber or health care professional for regular checks on your progress and for the needed blood tests and blood pressure measurements. It is especially important for the doctor to make sure your hemoglobin level is in the desired range, to limit the risk of potential side effects and to give you the best benefit. Keep all appointments for any recommended tests. Check your blood pressure as directed. Ask your doctor what your blood pressure should be and when you should contact him or her. As your body makes more red blood cells, you may need to take iron, folic acid, or vitamin B supplements. Ask your doctor or health care provider which products are right for you. If you have kidney disease continue dietary restrictions, even though this medication can make you feel better. Talk with your doctor or health care professional about the   foods you eat and the vitamins that you take. What side effects may I notice from receiving this medicine? Side effects that you should report to your doctor or health care professional as soon as possible: -allergic reactions like skin rash, itching or hives, swelling of the face, lips, or tongue -breathing problems -changes in vision -chest pain -confusion, trouble speaking  or understanding -feeling faint or lightheaded, falls -high blood pressure -muscle aches or pains -pain, swelling, warmth in the leg -rapid weight gain -severe headaches -sudden numbness or weakness of the face, arm or leg -trouble walking, dizziness, loss of balance or coordination -seizures (convulsions) -swelling of the ankles, feet, hands -unusually weak or tired Side effects that usually do not require medical attention (report to your doctor or health care professional if they continue or are bothersome): -diarrhea -fever, chills (flu-like symptoms) -headaches -nausea, vomiting -redness, stinging, or swelling at site where injected This list may not describe all possible side effects. Call your doctor for medical advice about side effects. You may report side effects to FDA at 1-800-FDA-1088. Where should I keep my medicine? Keep out of the reach of children. Store in a refrigerator between 2 and 8 degrees C (36 and 46 degrees F). Do not freeze. Do not shake. Throw away any unused portion if using a single-dose vial. Throw away any unused medicine after the expiration date. NOTE: This sheet is a summary. It may not cover all possible information. If you have questions about this medicine, talk to your doctor, pharmacist, or health care provider.    2016, Elsevier/Gold Standard. (2008-06-11 10:23:57)  

## 2015-12-15 ENCOUNTER — Other Ambulatory Visit (HOSPITAL_BASED_OUTPATIENT_CLINIC_OR_DEPARTMENT_OTHER): Payer: Medicare Other

## 2015-12-15 ENCOUNTER — Ambulatory Visit (HOSPITAL_BASED_OUTPATIENT_CLINIC_OR_DEPARTMENT_OTHER): Payer: Medicare Other

## 2015-12-15 VITALS — BP 115/52 | HR 64 | Temp 98.2°F | Resp 20

## 2015-12-15 DIAGNOSIS — D469 Myelodysplastic syndrome, unspecified: Secondary | ICD-10-CM

## 2015-12-15 LAB — CBC & DIFF AND RETIC
BASO%: 0.4 % (ref 0.0–2.0)
Basophils Absolute: 0 10*3/uL (ref 0.0–0.1)
EOS%: 3 % (ref 0.0–7.0)
Eosinophils Absolute: 0.2 10*3/uL (ref 0.0–0.5)
HEMATOCRIT: 28.8 % — AB (ref 38.4–49.9)
HGB: 9.3 g/dL — ABNORMAL LOW (ref 13.0–17.1)
Immature Retic Fract: 13.5 % — ABNORMAL HIGH (ref 3.00–10.60)
LYMPH#: 0.9 10*3/uL (ref 0.9–3.3)
LYMPH%: 15.4 % (ref 14.0–49.0)
MCH: 33.1 pg (ref 27.2–33.4)
MCHC: 32.3 g/dL (ref 32.0–36.0)
MCV: 102.5 fL — AB (ref 79.3–98.0)
MONO#: 0.8 10*3/uL (ref 0.1–0.9)
MONO%: 13.4 % (ref 0.0–14.0)
NEUT#: 3.8 10*3/uL (ref 1.5–6.5)
NEUT%: 67.8 % (ref 39.0–75.0)
NRBC: 0 % (ref 0–0)
Platelets: 106 10*3/uL — ABNORMAL LOW (ref 140–400)
RBC: 2.81 10*6/uL — ABNORMAL LOW (ref 4.20–5.82)
RDW: 16.7 % — AB (ref 11.0–14.6)
RETIC %: 1.89 % — AB (ref 0.80–1.80)
Retic Ct Abs: 53.11 10*3/uL (ref 34.80–93.90)
WBC: 5.6 10*3/uL (ref 4.0–10.3)

## 2015-12-15 MED ORDER — DARBEPOETIN ALFA 200 MCG/0.4ML IJ SOSY
200.0000 ug | PREFILLED_SYRINGE | Freq: Once | INTRAMUSCULAR | Status: AC
  Administered 2015-12-15: 200 ug via SUBCUTANEOUS
  Filled 2015-12-15: qty 0.4

## 2015-12-15 NOTE — Patient Instructions (Signed)
Darbepoetin Alfa injection What is this medicine? DARBEPOETIN ALFA (dar be POE e tin AL fa) helps your body make more red blood cells. It is used to treat anemia caused by chronic kidney failure and chemotherapy. This medicine may be used for other purposes; ask your health care provider or pharmacist if you have questions. What should I tell my health care provider before I take this medicine? They need to know if you have any of these conditions: -blood clotting disorders or history of blood clots -cancer patient not on chemotherapy -cystic fibrosis -heart disease, such as angina, heart failure, or a history of a heart attack -hemoglobin level of 12 g/dL or greater -high blood pressure -low levels of folate, iron, or vitamin B12 -seizures -an unusual or allergic reaction to darbepoetin, erythropoietin, albumin, hamster proteins, latex, other medicines, foods, dyes, or preservatives -pregnant or trying to get pregnant -breast-feeding How should I use this medicine? This medicine is for injection into a vein or under the skin. It is usually given by a health care professional in a hospital or clinic setting. If you get this medicine at home, you will be taught how to prepare and give this medicine. Do not shake the solution before you withdraw a dose. Use exactly as directed. Take your medicine at regular intervals. Do not take your medicine more often than directed. It is important that you put your used needles and syringes in a special sharps container. Do not put them in a trash can. If you do not have a sharps container, call your pharmacist or healthcare provider to get one. Talk to your pediatrician regarding the use of this medicine in children. While this medicine may be used in children as young as 1 year for selected conditions, precautions do apply. Overdosage: If you think you have taken too much of this medicine contact a poison control center or emergency room at once. NOTE:  This medicine is only for you. Do not share this medicine with others. What if I miss a dose? If you miss a dose, take it as soon as you can. If it is almost time for your next dose, take only that dose. Do not take double or extra doses. What may interact with this medicine? Do not take this medicine with any of the following medications: -epoetin alfa This list may not describe all possible interactions. Give your health care provider a list of all the medicines, herbs, non-prescription drugs, or dietary supplements you use. Also tell them if you smoke, drink alcohol, or use illegal drugs. Some items may interact with your medicine. What should I watch for while using this medicine? Visit your prescriber or health care professional for regular checks on your progress and for the needed blood tests and blood pressure measurements. It is especially important for the doctor to make sure your hemoglobin level is in the desired range, to limit the risk of potential side effects and to give you the best benefit. Keep all appointments for any recommended tests. Check your blood pressure as directed. Ask your doctor what your blood pressure should be and when you should contact him or her. As your body makes more red blood cells, you may need to take iron, folic acid, or vitamin B supplements. Ask your doctor or health care provider which products are right for you. If you have kidney disease continue dietary restrictions, even though this medication can make you feel better. Talk with your doctor or health care professional about the   foods you eat and the vitamins that you take. What side effects may I notice from receiving this medicine? Side effects that you should report to your doctor or health care professional as soon as possible: -allergic reactions like skin rash, itching or hives, swelling of the face, lips, or tongue -breathing problems -changes in vision -chest pain -confusion, trouble speaking  or understanding -feeling faint or lightheaded, falls -high blood pressure -muscle aches or pains -pain, swelling, warmth in the leg -rapid weight gain -severe headaches -sudden numbness or weakness of the face, arm or leg -trouble walking, dizziness, loss of balance or coordination -seizures (convulsions) -swelling of the ankles, feet, hands -unusually weak or tired Side effects that usually do not require medical attention (report to your doctor or health care professional if they continue or are bothersome): -diarrhea -fever, chills (flu-like symptoms) -headaches -nausea, vomiting -redness, stinging, or swelling at site where injected This list may not describe all possible side effects. Call your doctor for medical advice about side effects. You may report side effects to FDA at 1-800-FDA-1088. Where should I keep my medicine? Keep out of the reach of children. Store in a refrigerator between 2 and 8 degrees C (36 and 46 degrees F). Do not freeze. Do not shake. Throw away any unused portion if using a single-dose vial. Throw away any unused medicine after the expiration date. NOTE: This sheet is a summary. It may not cover all possible information. If you have questions about this medicine, talk to your doctor, pharmacist, or health care provider.    2016, Elsevier/Gold Standard. (2008-06-11 10:23:57)  

## 2015-12-29 ENCOUNTER — Other Ambulatory Visit (HOSPITAL_BASED_OUTPATIENT_CLINIC_OR_DEPARTMENT_OTHER): Payer: Medicare Other

## 2015-12-29 ENCOUNTER — Ambulatory Visit: Payer: Medicare Other

## 2015-12-29 DIAGNOSIS — D469 Myelodysplastic syndrome, unspecified: Secondary | ICD-10-CM

## 2015-12-29 LAB — CBC & DIFF AND RETIC
BASO%: 0.7 % (ref 0.0–2.0)
BASOS ABS: 0 10*3/uL (ref 0.0–0.1)
EOS ABS: 0.2 10*3/uL (ref 0.0–0.5)
EOS%: 3.7 % (ref 0.0–7.0)
HEMATOCRIT: 32.9 % — AB (ref 38.4–49.9)
HEMOGLOBIN: 10.4 g/dL — AB (ref 13.0–17.1)
IMMATURE RETIC FRACT: 8 % (ref 3.00–10.60)
LYMPH#: 1.3 10*3/uL (ref 0.9–3.3)
LYMPH%: 28.6 % (ref 14.0–49.0)
MCH: 33.1 pg (ref 27.2–33.4)
MCHC: 31.6 g/dL — ABNORMAL LOW (ref 32.0–36.0)
MCV: 104.8 fL — ABNORMAL HIGH (ref 79.3–98.0)
MONO#: 0.7 10*3/uL (ref 0.1–0.9)
MONO%: 14.3 % — ABNORMAL HIGH (ref 0.0–14.0)
NEUT#: 2.4 10*3/uL (ref 1.5–6.5)
NEUT%: 52.7 % (ref 39.0–75.0)
PLATELETS: 128 10*3/uL — AB (ref 140–400)
RBC: 3.14 10*6/uL — AB (ref 4.20–5.82)
RDW: 18.3 % — ABNORMAL HIGH (ref 11.0–14.6)
RETIC CT ABS: 62.17 10*3/uL (ref 34.80–93.90)
Retic %: 1.98 % — ABNORMAL HIGH (ref 0.80–1.80)
WBC: 4.6 10*3/uL (ref 4.0–10.3)

## 2015-12-29 NOTE — Progress Notes (Signed)
Todays lab showed Hemoglobin of 10.4. Injection held due to protocol parameters. Pt given copy of labs, copy of current schedule. Instructed to keep up coming appointments and to call office should issues occur.

## 2016-01-06 ENCOUNTER — Other Ambulatory Visit: Payer: Self-pay | Admitting: Nephrology

## 2016-01-06 DIAGNOSIS — R319 Hematuria, unspecified: Secondary | ICD-10-CM

## 2016-01-07 ENCOUNTER — Other Ambulatory Visit: Payer: Self-pay | Admitting: Interventional Cardiology

## 2016-01-09 ENCOUNTER — Ambulatory Visit
Admission: RE | Admit: 2016-01-09 | Discharge: 2016-01-09 | Disposition: A | Discharge status: Home / Self Care | Payer: Medicare Other | Source: Ambulatory Visit | Attending: Nephrology | Admitting: Nephrology

## 2016-01-09 DIAGNOSIS — R319 Hematuria, unspecified: Secondary | ICD-10-CM

## 2016-01-12 ENCOUNTER — Other Ambulatory Visit (HOSPITAL_BASED_OUTPATIENT_CLINIC_OR_DEPARTMENT_OTHER): Payer: Medicare Other

## 2016-01-12 ENCOUNTER — Ambulatory Visit (HOSPITAL_BASED_OUTPATIENT_CLINIC_OR_DEPARTMENT_OTHER): Payer: Medicare Other

## 2016-01-12 VITALS — BP 114/48 | HR 60 | Temp 98.3°F | Resp 18

## 2016-01-12 DIAGNOSIS — D469 Myelodysplastic syndrome, unspecified: Secondary | ICD-10-CM | POA: Diagnosis not present

## 2016-01-12 LAB — CBC & DIFF AND RETIC
BASO%: 1.2 % (ref 0.0–2.0)
Basophils Absolute: 0 10*3/uL (ref 0.0–0.1)
EOS%: 9.4 % — AB (ref 0.0–7.0)
Eosinophils Absolute: 0.3 10*3/uL (ref 0.0–0.5)
HEMATOCRIT: 28.3 % — AB (ref 38.4–49.9)
HGB: 9.3 g/dL — ABNORMAL LOW (ref 13.0–17.1)
Immature Retic Fract: 2 % — ABNORMAL LOW (ref 3.00–10.60)
LYMPH#: 0.9 10*3/uL (ref 0.9–3.3)
LYMPH%: 28.5 % (ref 14.0–49.0)
MCH: 33.2 pg (ref 27.2–33.4)
MCHC: 32.9 g/dL (ref 32.0–36.0)
MCV: 101.1 fL — ABNORMAL HIGH (ref 79.3–98.0)
MONO#: 0.6 10*3/uL (ref 0.1–0.9)
MONO%: 17.9 % — ABNORMAL HIGH (ref 0.0–14.0)
NEUT%: 43 % (ref 39.0–75.0)
NEUTROS ABS: 1.4 10*3/uL — AB (ref 1.5–6.5)
NRBC: 0 % (ref 0–0)
Platelets: 88 10*3/uL — ABNORMAL LOW (ref 140–400)
RBC: 2.8 10*6/uL — AB (ref 4.20–5.82)
RDW: 16.9 % — AB (ref 11.0–14.6)
RETIC %: 0.38 % — AB (ref 0.80–1.80)
RETIC CT ABS: 10.64 10*3/uL — AB (ref 34.80–93.90)
WBC: 3.3 10*3/uL — AB (ref 4.0–10.3)

## 2016-01-12 MED ORDER — DARBEPOETIN ALFA 200 MCG/0.4ML IJ SOSY
200.0000 ug | PREFILLED_SYRINGE | Freq: Once | INTRAMUSCULAR | Status: AC
  Administered 2016-01-12: 200 ug via SUBCUTANEOUS
  Filled 2016-01-12: qty 0.4

## 2016-01-12 NOTE — Progress Notes (Signed)
Pt receiving aranesp for HGB of 9.3, Patients neutrophils are 1.4 and WBC 3.3. Informed Tammi with Dr. Alvy Bimler, Neutropenic precautions went over with patient. Patient states he has been passing bright red blood in urine for last 2 weeks, states he has been seeing urologist and had ultrasound last Friday. Informed Dr. Calton Dach nurse

## 2016-01-12 NOTE — Progress Notes (Signed)
Per Dr. Alvy Bimler, pt to return in 2 weeks for CBC and injection. Patient to call if he has any weakness or other abnormal symptoms or if the blood in the urine increases. Pt verbalized understanding and denies any further questions or concerns. Aranesp given and pt d/c'd

## 2016-01-12 NOTE — Patient Instructions (Signed)
Darbepoetin Alfa injection What is this medicine? DARBEPOETIN ALFA (dar be POE e tin AL fa) helps your body make more red blood cells. It is used to treat anemia caused by chronic kidney failure and chemotherapy. This medicine may be used for other purposes; ask your health care provider or pharmacist if you have questions. What should I tell my health care provider before I take this medicine? They need to know if you have any of these conditions: -blood clotting disorders or history of blood clots -cancer patient not on chemotherapy -cystic fibrosis -heart disease, such as angina, heart failure, or a history of a heart attack -hemoglobin level of 12 g/dL or greater -high blood pressure -low levels of folate, iron, or vitamin B12 -seizures -an unusual or allergic reaction to darbepoetin, erythropoietin, albumin, hamster proteins, latex, other medicines, foods, dyes, or preservatives -pregnant or trying to get pregnant -breast-feeding How should I use this medicine? This medicine is for injection into a vein or under the skin. It is usually given by a health care professional in a hospital or clinic setting. If you get this medicine at home, you will be taught how to prepare and give this medicine. Do not shake the solution before you withdraw a dose. Use exactly as directed. Take your medicine at regular intervals. Do not take your medicine more often than directed. It is important that you put your used needles and syringes in a special sharps container. Do not put them in a trash can. If you do not have a sharps container, call your pharmacist or healthcare provider to get one. Talk to your pediatrician regarding the use of this medicine in children. While this medicine may be used in children as young as 1 year for selected conditions, precautions do apply. Overdosage: If you think you have taken too much of this medicine contact a poison control center or emergency room at once. NOTE:  This medicine is only for you. Do not share this medicine with others. What if I miss a dose? If you miss a dose, take it as soon as you can. If it is almost time for your next dose, take only that dose. Do not take double or extra doses. What may interact with this medicine? Do not take this medicine with any of the following medications: -epoetin alfa This list may not describe all possible interactions. Give your health care provider a list of all the medicines, herbs, non-prescription drugs, or dietary supplements you use. Also tell them if you smoke, drink alcohol, or use illegal drugs. Some items may interact with your medicine. What should I watch for while using this medicine? Visit your prescriber or health care professional for regular checks on your progress and for the needed blood tests and blood pressure measurements. It is especially important for the doctor to make sure your hemoglobin level is in the desired range, to limit the risk of potential side effects and to give you the best benefit. Keep all appointments for any recommended tests. Check your blood pressure as directed. Ask your doctor what your blood pressure should be and when you should contact him or her. As your body makes more red blood cells, you may need to take iron, folic acid, or vitamin B supplements. Ask your doctor or health care provider which products are right for you. If you have kidney disease continue dietary restrictions, even though this medication can make you feel better. Talk with your doctor or health care professional about the   foods you eat and the vitamins that you take. What side effects may I notice from receiving this medicine? Side effects that you should report to your doctor or health care professional as soon as possible: -allergic reactions like skin rash, itching or hives, swelling of the face, lips, or tongue -breathing problems -changes in vision -chest pain -confusion, trouble speaking  or understanding -feeling faint or lightheaded, falls -high blood pressure -muscle aches or pains -pain, swelling, warmth in the leg -rapid weight gain -severe headaches -sudden numbness or weakness of the face, arm or leg -trouble walking, dizziness, loss of balance or coordination -seizures (convulsions) -swelling of the ankles, feet, hands -unusually weak or tired Side effects that usually do not require medical attention (report to your doctor or health care professional if they continue or are bothersome): -diarrhea -fever, chills (flu-like symptoms) -headaches -nausea, vomiting -redness, stinging, or swelling at site where injected This list may not describe all possible side effects. Call your doctor for medical advice about side effects. You may report side effects to FDA at 1-800-FDA-1088. Where should I keep my medicine? Keep out of the reach of children. Store in a refrigerator between 2 and 8 degrees C (36 and 46 degrees F). Do not freeze. Do not shake. Throw away any unused portion if using a single-dose vial. Throw away any unused medicine after the expiration date. NOTE: This sheet is a summary. It may not cover all possible information. If you have questions about this medicine, talk to your doctor, pharmacist, or health care provider.    2016, Elsevier/Gold Standard. (2008-06-11 10:23:57)  

## 2016-01-16 ENCOUNTER — Encounter (HOSPITAL_COMMUNITY): Payer: Self-pay | Admitting: Emergency Medicine

## 2016-01-16 ENCOUNTER — Observation Stay (HOSPITAL_COMMUNITY): Payer: Medicare Other

## 2016-01-16 ENCOUNTER — Emergency Department (HOSPITAL_COMMUNITY): Payer: Medicare Other

## 2016-01-16 ENCOUNTER — Inpatient Hospital Stay (HOSPITAL_COMMUNITY)
Admission: EM | Admit: 2016-01-16 | Discharge: 2016-02-03 | DRG: 270 | Disposition: A | Discharge status: Skilled Nursing Facility | Payer: Medicare Other | Attending: Internal Medicine | Admitting: Internal Medicine

## 2016-01-16 DIAGNOSIS — I480 Paroxysmal atrial fibrillation: Secondary | ICD-10-CM | POA: Diagnosis not present

## 2016-01-16 DIAGNOSIS — D696 Thrombocytopenia, unspecified: Secondary | ICD-10-CM | POA: Diagnosis not present

## 2016-01-16 DIAGNOSIS — Y712 Prosthetic and other implants, materials and accessory cardiovascular devices associated with adverse incidents: Secondary | ICD-10-CM | POA: Diagnosis not present

## 2016-01-16 DIAGNOSIS — M1129 Other chondrocalcinosis, multiple sites: Secondary | ICD-10-CM | POA: Diagnosis present

## 2016-01-16 DIAGNOSIS — E872 Acidosis: Secondary | ICD-10-CM | POA: Diagnosis present

## 2016-01-16 DIAGNOSIS — E1151 Type 2 diabetes mellitus with diabetic peripheral angiopathy without gangrene: Secondary | ICD-10-CM | POA: Diagnosis present

## 2016-01-16 DIAGNOSIS — I10 Essential (primary) hypertension: Secondary | ICD-10-CM | POA: Diagnosis present

## 2016-01-16 DIAGNOSIS — Z950 Presence of cardiac pacemaker: Secondary | ICD-10-CM | POA: Diagnosis not present

## 2016-01-16 DIAGNOSIS — I330 Acute and subacute infective endocarditis: Secondary | ICD-10-CM | POA: Diagnosis present

## 2016-01-16 DIAGNOSIS — E1122 Type 2 diabetes mellitus with diabetic chronic kidney disease: Secondary | ICD-10-CM | POA: Diagnosis present

## 2016-01-16 DIAGNOSIS — N179 Acute kidney failure, unspecified: Secondary | ICD-10-CM | POA: Diagnosis present

## 2016-01-16 DIAGNOSIS — Z951 Presence of aortocoronary bypass graft: Secondary | ICD-10-CM

## 2016-01-16 DIAGNOSIS — Z8711 Personal history of peptic ulcer disease: Secondary | ICD-10-CM

## 2016-01-16 DIAGNOSIS — T827XXA Infection and inflammatory reaction due to other cardiac and vascular devices, implants and grafts, initial encounter: Principal | ICD-10-CM | POA: Diagnosis present

## 2016-01-16 DIAGNOSIS — R319 Hematuria, unspecified: Secondary | ICD-10-CM

## 2016-01-16 DIAGNOSIS — I959 Hypotension, unspecified: Secondary | ICD-10-CM | POA: Diagnosis not present

## 2016-01-16 DIAGNOSIS — R7881 Bacteremia: Secondary | ICD-10-CM | POA: Diagnosis present

## 2016-01-16 DIAGNOSIS — E46 Unspecified protein-calorie malnutrition: Secondary | ICD-10-CM | POA: Diagnosis present

## 2016-01-16 DIAGNOSIS — N184 Chronic kidney disease, stage 4 (severe): Secondary | ICD-10-CM | POA: Diagnosis present

## 2016-01-16 DIAGNOSIS — E871 Hypo-osmolality and hyponatremia: Secondary | ICD-10-CM | POA: Diagnosis not present

## 2016-01-16 DIAGNOSIS — R579 Shock, unspecified: Secondary | ICD-10-CM | POA: Diagnosis not present

## 2016-01-16 DIAGNOSIS — N289 Disorder of kidney and ureter, unspecified: Secondary | ICD-10-CM | POA: Diagnosis present

## 2016-01-16 DIAGNOSIS — D469 Myelodysplastic syndrome, unspecified: Secondary | ICD-10-CM

## 2016-01-16 DIAGNOSIS — N3289 Other specified disorders of bladder: Secondary | ICD-10-CM | POA: Diagnosis present

## 2016-01-16 DIAGNOSIS — F419 Anxiety disorder, unspecified: Secondary | ICD-10-CM | POA: Diagnosis present

## 2016-01-16 DIAGNOSIS — K219 Gastro-esophageal reflux disease without esophagitis: Secondary | ICD-10-CM | POA: Diagnosis present

## 2016-01-16 DIAGNOSIS — E1143 Type 2 diabetes mellitus with diabetic autonomic (poly)neuropathy: Secondary | ICD-10-CM | POA: Diagnosis present

## 2016-01-16 DIAGNOSIS — I2510 Atherosclerotic heart disease of native coronary artery without angina pectoris: Secondary | ICD-10-CM | POA: Diagnosis present

## 2016-01-16 DIAGNOSIS — D631 Anemia in chronic kidney disease: Secondary | ICD-10-CM | POA: Diagnosis present

## 2016-01-16 DIAGNOSIS — T827XXD Infection and inflammatory reaction due to other cardiac and vascular devices, implants and grafts, subsequent encounter: Secondary | ICD-10-CM | POA: Diagnosis not present

## 2016-01-16 DIAGNOSIS — N170 Acute kidney failure with tubular necrosis: Secondary | ICD-10-CM | POA: Diagnosis present

## 2016-01-16 DIAGNOSIS — R627 Adult failure to thrive: Secondary | ICD-10-CM | POA: Diagnosis present

## 2016-01-16 DIAGNOSIS — E1159 Type 2 diabetes mellitus with other circulatory complications: Secondary | ICD-10-CM | POA: Diagnosis not present

## 2016-01-16 DIAGNOSIS — N3041 Irradiation cystitis with hematuria: Secondary | ICD-10-CM | POA: Diagnosis present

## 2016-01-16 DIAGNOSIS — R52 Pain, unspecified: Secondary | ICD-10-CM | POA: Diagnosis present

## 2016-01-16 DIAGNOSIS — Z955 Presence of coronary angioplasty implant and graft: Secondary | ICD-10-CM

## 2016-01-16 DIAGNOSIS — K3184 Gastroparesis: Secondary | ICD-10-CM | POA: Diagnosis present

## 2016-01-16 DIAGNOSIS — E119 Type 2 diabetes mellitus without complications: Secondary | ICD-10-CM

## 2016-01-16 DIAGNOSIS — J189 Pneumonia, unspecified organism: Secondary | ICD-10-CM | POA: Diagnosis present

## 2016-01-16 DIAGNOSIS — M1180 Other specified crystal arthropathies, unspecified site: Secondary | ICD-10-CM | POA: Diagnosis not present

## 2016-01-16 DIAGNOSIS — N189 Chronic kidney disease, unspecified: Secondary | ICD-10-CM | POA: Diagnosis not present

## 2016-01-16 DIAGNOSIS — I151 Hypertension secondary to other renal disorders: Secondary | ICD-10-CM | POA: Diagnosis present

## 2016-01-16 DIAGNOSIS — E785 Hyperlipidemia, unspecified: Secondary | ICD-10-CM | POA: Diagnosis present

## 2016-01-16 DIAGNOSIS — Z09 Encounter for follow-up examination after completed treatment for conditions other than malignant neoplasm: Secondary | ICD-10-CM

## 2016-01-16 DIAGNOSIS — Y831 Surgical operation with implant of artificial internal device as the cause of abnormal reaction of the patient, or of later complication, without mention of misadventure at the time of the procedure: Secondary | ICD-10-CM | POA: Diagnosis present

## 2016-01-16 DIAGNOSIS — D638 Anemia in other chronic diseases classified elsewhere: Secondary | ICD-10-CM | POA: Diagnosis not present

## 2016-01-16 DIAGNOSIS — R791 Abnormal coagulation profile: Secondary | ICD-10-CM | POA: Diagnosis not present

## 2016-01-16 DIAGNOSIS — I5032 Chronic diastolic (congestive) heart failure: Secondary | ICD-10-CM | POA: Diagnosis present

## 2016-01-16 DIAGNOSIS — B9561 Methicillin susceptible Staphylococcus aureus infection as the cause of diseases classified elsewhere: Secondary | ICD-10-CM

## 2016-01-16 DIAGNOSIS — M1189 Other specified crystal arthropathies, multiple sites: Secondary | ICD-10-CM | POA: Diagnosis present

## 2016-01-16 DIAGNOSIS — G9340 Encephalopathy, unspecified: Secondary | ICD-10-CM | POA: Diagnosis not present

## 2016-01-16 DIAGNOSIS — R042 Hemoptysis: Secondary | ICD-10-CM | POA: Diagnosis present

## 2016-01-16 DIAGNOSIS — F329 Major depressive disorder, single episode, unspecified: Secondary | ICD-10-CM | POA: Diagnosis present

## 2016-01-16 DIAGNOSIS — I130 Hypertensive heart and chronic kidney disease with heart failure and stage 1 through stage 4 chronic kidney disease, or unspecified chronic kidney disease: Secondary | ICD-10-CM | POA: Diagnosis present

## 2016-01-16 DIAGNOSIS — Z7902 Long term (current) use of antithrombotics/antiplatelets: Secondary | ICD-10-CM

## 2016-01-16 DIAGNOSIS — I252 Old myocardial infarction: Secondary | ICD-10-CM

## 2016-01-16 DIAGNOSIS — M25432 Effusion, left wrist: Secondary | ICD-10-CM

## 2016-01-16 DIAGNOSIS — Z79899 Other long term (current) drug therapy: Secondary | ICD-10-CM

## 2016-01-16 DIAGNOSIS — Z7982 Long term (current) use of aspirin: Secondary | ICD-10-CM

## 2016-01-16 DIAGNOSIS — I25810 Atherosclerosis of coronary artery bypass graft(s) without angina pectoris: Secondary | ICD-10-CM | POA: Diagnosis present

## 2016-01-16 DIAGNOSIS — T82897A Other specified complication of cardiac prosthetic devices, implants and grafts, initial encounter: Secondary | ICD-10-CM | POA: Diagnosis not present

## 2016-01-16 DIAGNOSIS — Z86718 Personal history of other venous thrombosis and embolism: Secondary | ICD-10-CM

## 2016-01-16 DIAGNOSIS — A4101 Sepsis due to Methicillin susceptible Staphylococcus aureus: Secondary | ICD-10-CM | POA: Diagnosis present

## 2016-01-16 DIAGNOSIS — M7989 Other specified soft tissue disorders: Secondary | ICD-10-CM | POA: Diagnosis present

## 2016-01-16 DIAGNOSIS — D689 Coagulation defect, unspecified: Secondary | ICD-10-CM | POA: Diagnosis present

## 2016-01-16 DIAGNOSIS — Z515 Encounter for palliative care: Secondary | ICD-10-CM | POA: Diagnosis not present

## 2016-01-16 DIAGNOSIS — Z87891 Personal history of nicotine dependence: Secondary | ICD-10-CM

## 2016-01-16 DIAGNOSIS — J15211 Pneumonia due to Methicillin susceptible Staphylococcus aureus: Secondary | ICD-10-CM | POA: Diagnosis not present

## 2016-01-16 DIAGNOSIS — I272 Other secondary pulmonary hypertension: Secondary | ICD-10-CM | POA: Diagnosis present

## 2016-01-16 DIAGNOSIS — B9562 Methicillin resistant Staphylococcus aureus infection as the cause of diseases classified elsewhere: Secondary | ICD-10-CM | POA: Insufficient documentation

## 2016-01-16 DIAGNOSIS — Y842 Radiological procedure and radiotherapy as the cause of abnormal reaction of the patient, or of later complication, without mention of misadventure at the time of the procedure: Secondary | ICD-10-CM | POA: Diagnosis present

## 2016-01-16 DIAGNOSIS — Z7189 Other specified counseling: Secondary | ICD-10-CM

## 2016-01-16 DIAGNOSIS — Y718 Miscellaneous cardiovascular devices associated with adverse incidents, not elsewhere classified: Secondary | ICD-10-CM | POA: Diagnosis not present

## 2016-01-16 DIAGNOSIS — I495 Sick sinus syndrome: Secondary | ICD-10-CM | POA: Diagnosis not present

## 2016-01-16 DIAGNOSIS — Z6826 Body mass index (BMI) 26.0-26.9, adult: Secondary | ICD-10-CM

## 2016-01-16 DIAGNOSIS — Z66 Do not resuscitate: Secondary | ICD-10-CM | POA: Diagnosis not present

## 2016-01-16 DIAGNOSIS — C61 Malignant neoplasm of prostate: Secondary | ICD-10-CM | POA: Diagnosis present

## 2016-01-16 DIAGNOSIS — I9589 Other hypotension: Secondary | ICD-10-CM | POA: Diagnosis not present

## 2016-01-16 DIAGNOSIS — E876 Hypokalemia: Secondary | ICD-10-CM | POA: Diagnosis not present

## 2016-01-16 HISTORY — DX: Pneumonia, unspecified organism: J18.9

## 2016-01-16 LAB — URINALYSIS, ROUTINE W REFLEX MICROSCOPIC
BILIRUBIN URINE: NEGATIVE
Glucose, UA: NEGATIVE mg/dL
KETONES UR: NEGATIVE mg/dL
Leukocytes, UA: NEGATIVE
NITRITE: NEGATIVE
Protein, ur: 100 mg/dL — AB
SPECIFIC GRAVITY, URINE: 1.015 (ref 1.005–1.030)
pH: 5.5 (ref 5.0–8.0)

## 2016-01-16 LAB — URINE MICROSCOPIC-ADD ON: BACTERIA UA: NONE SEEN

## 2016-01-16 LAB — CBC WITH DIFFERENTIAL/PLATELET
Basophils Absolute: 0 10*3/uL (ref 0.0–0.1)
Basophils Relative: 0 %
EOS ABS: 0 10*3/uL (ref 0.0–0.7)
Eosinophils Relative: 0 %
HCT: 30.4 % — ABNORMAL LOW (ref 39.0–52.0)
Hemoglobin: 9.7 g/dL — ABNORMAL LOW (ref 13.0–17.0)
LYMPHS ABS: 0.8 10*3/uL (ref 0.7–4.0)
Lymphocytes Relative: 6 %
MCH: 32.7 pg (ref 26.0–34.0)
MCHC: 31.9 g/dL (ref 30.0–36.0)
MCV: 102.4 fL — AB (ref 78.0–100.0)
Monocytes Absolute: 1.4 10*3/uL — ABNORMAL HIGH (ref 0.1–1.0)
Monocytes Relative: 11 %
NEUTROS PCT: 84 %
Neutro Abs: 11 10*3/uL — ABNORMAL HIGH (ref 1.7–7.7)
PLATELETS: 100 10*3/uL — AB (ref 150–400)
RBC: 2.97 MIL/uL — AB (ref 4.22–5.81)
RDW: 17.1 % — AB (ref 11.5–15.5)
WBC: 13.1 10*3/uL — AB (ref 4.0–10.5)

## 2016-01-16 LAB — I-STAT TROPONIN, ED: TROPONIN I, POC: 0.03 ng/mL (ref 0.00–0.08)

## 2016-01-16 LAB — DIC (DISSEMINATED INTRAVASCULAR COAGULATION) PANEL (NOT AT ARMC)
D-Dimer, Quant: 2.14 ug/mL-FEU — ABNORMAL HIGH (ref 0.00–0.50)
Fibrinogen: 451 mg/dL (ref 204–475)
INR: 1.27 (ref 0.00–1.49)
Platelets: 95 10*3/uL — ABNORMAL LOW (ref 150–400)
Prothrombin Time: 16 seconds — ABNORMAL HIGH (ref 11.6–15.2)
aPTT: 35 seconds (ref 24–37)

## 2016-01-16 LAB — COMPREHENSIVE METABOLIC PANEL
ALT: 12 U/L — AB (ref 17–63)
ANION GAP: 13 (ref 5–15)
AST: 30 U/L (ref 15–41)
Albumin: 4.2 g/dL (ref 3.5–5.0)
Alkaline Phosphatase: 27 U/L — ABNORMAL LOW (ref 38–126)
BUN: 67 mg/dL — ABNORMAL HIGH (ref 6–20)
CHLORIDE: 105 mmol/L (ref 101–111)
CO2: 21 mmol/L — AB (ref 22–32)
Calcium: 9.5 mg/dL (ref 8.9–10.3)
Creatinine, Ser: 3.48 mg/dL — ABNORMAL HIGH (ref 0.61–1.24)
GFR, EST AFRICAN AMERICAN: 18 mL/min — AB (ref >60)
GFR, EST NON AFRICAN AMERICAN: 15 mL/min — AB (ref >60)
Glucose, Bld: 141 mg/dL — ABNORMAL HIGH (ref 65–99)
POTASSIUM: 3.5 mmol/L (ref 3.5–5.1)
SODIUM: 139 mmol/L (ref 135–145)
Total Bilirubin: 0.7 mg/dL (ref 0.3–1.2)
Total Protein: 7.6 g/dL (ref 6.5–8.1)

## 2016-01-16 LAB — I-STAT CG4 LACTIC ACID, ED: LACTIC ACID, VENOUS: 2.43 mmol/L — AB (ref 0.5–1.9)

## 2016-01-16 LAB — DIC (DISSEMINATED INTRAVASCULAR COAGULATION)PANEL: Smear Review: NONE SEEN

## 2016-01-16 LAB — D-DIMER, QUANTITATIVE (NOT AT ARMC): D DIMER QUANT: 2.23 ug{FEU}/mL — AB (ref 0.00–0.50)

## 2016-01-16 MED ORDER — ADULT MULTIVITAMIN W/MINERALS CH
1.0000 | ORAL_TABLET | Freq: Every morning | ORAL | Status: DC
  Administered 2016-01-17 – 2016-02-03 (×17): 1 via ORAL
  Filled 2016-01-16 (×19): qty 1

## 2016-01-16 MED ORDER — HYDRALAZINE HCL 50 MG PO TABS
50.0000 mg | ORAL_TABLET | Freq: Three times a day (TID) | ORAL | Status: DC
  Administered 2016-01-16 – 2016-01-28 (×33): 50 mg via ORAL
  Filled 2016-01-16 (×34): qty 1

## 2016-01-16 MED ORDER — IPRATROPIUM-ALBUTEROL 0.5-2.5 (3) MG/3ML IN SOLN
3.0000 mL | Freq: Once | RESPIRATORY_TRACT | Status: AC
  Administered 2016-01-16: 3 mL via RESPIRATORY_TRACT
  Filled 2016-01-16: qty 3

## 2016-01-16 MED ORDER — SODIUM CHLORIDE 0.9 % IV SOLN
INTRAVENOUS | Status: DC
  Administered 2016-01-16: 19:00:00 via INTRAVENOUS

## 2016-01-16 MED ORDER — TUBERCULIN PPD 5 UNIT/0.1ML ID SOLN
5.0000 [IU] | Freq: Once | INTRADERMAL | Status: AC
  Administered 2016-01-16: 5 [IU] via INTRADERMAL
  Filled 2016-01-16: qty 0.1

## 2016-01-16 MED ORDER — CARVEDILOL 25 MG PO TABS
25.0000 mg | ORAL_TABLET | Freq: Two times a day (BID) | ORAL | Status: DC
  Administered 2016-01-16 – 2016-01-28 (×22): 25 mg via ORAL
  Filled 2016-01-16 (×18): qty 1
  Filled 2016-01-16: qty 2
  Filled 2016-01-16 (×3): qty 1

## 2016-01-16 MED ORDER — CLONIDINE HCL 0.1 MG PO TABS
0.1000 mg | ORAL_TABLET | Freq: Two times a day (BID) | ORAL | Status: DC
  Administered 2016-01-16 – 2016-01-28 (×24): 0.1 mg via ORAL
  Filled 2016-01-16 (×24): qty 1

## 2016-01-16 MED ORDER — DEXTROSE 5 % IV SOLN
1.0000 g | INTRAVENOUS | Status: DC
  Filled 2016-01-16 (×2): qty 10

## 2016-01-16 MED ORDER — ALBUTEROL SULFATE (2.5 MG/3ML) 0.083% IN NEBU: 5.0000 mg | INHALATION_SOLUTION | RESPIRATORY_TRACT | Status: AC | PRN

## 2016-01-16 MED ORDER — DEXTROSE 5 % IV SOLN
1.0000 g | Freq: Once | INTRAVENOUS | Status: AC
  Administered 2016-01-16: 1 g via INTRAVENOUS
  Filled 2016-01-16: qty 10

## 2016-01-16 MED ORDER — CALCIFEDIOL ER 30 MCG PO CPCR
30.0000 ug | ORAL_CAPSULE | Freq: Every day | ORAL | Status: DC
  Filled 2016-01-16 (×2): qty 1

## 2016-01-16 MED ORDER — ISOSORBIDE MONONITRATE ER 30 MG PO TB24
120.0000 mg | ORAL_TABLET | Freq: Every day | ORAL | Status: DC
  Administered 2016-01-17 – 2016-01-28 (×12): 120 mg via ORAL
  Filled 2016-01-16 (×10): qty 2
  Filled 2016-01-16: qty 4
  Filled 2016-01-16 (×2): qty 2

## 2016-01-16 MED ORDER — DEXTROSE 5 % IV SOLN
500.0000 mg | INTRAVENOUS | Status: DC
  Filled 2016-01-16: qty 500

## 2016-01-16 MED ORDER — DEXTROSE 5 % IV SOLN
500.0000 mg | Freq: Once | INTRAVENOUS | Status: AC
  Administered 2016-01-16: 500 mg via INTRAVENOUS
  Filled 2016-01-16: qty 500

## 2016-01-16 MED ORDER — TAMSULOSIN HCL 0.4 MG PO CAPS
0.4000 mg | ORAL_CAPSULE | Freq: Every day | ORAL | Status: DC
  Administered 2016-01-16 – 2016-02-02 (×15): 0.4 mg via ORAL
  Filled 2016-01-16 (×16): qty 1

## 2016-01-16 MED ORDER — SODIUM CHLORIDE 0.9 % IV BOLUS (SEPSIS)
1000.0000 mL | Freq: Once | INTRAVENOUS | Status: AC
  Administered 2016-01-16: 1000 mL via INTRAVENOUS

## 2016-01-16 MED ORDER — AMIODARONE HCL 200 MG PO TABS
100.0000 mg | ORAL_TABLET | Freq: Every day | ORAL | Status: DC
  Administered 2016-01-17 – 2016-02-03 (×18): 100 mg via ORAL
  Filled 2016-01-16 (×19): qty 1

## 2016-01-16 MED ORDER — ATORVASTATIN CALCIUM 80 MG PO TABS
80.0000 mg | ORAL_TABLET | Freq: Every day | ORAL | Status: DC
  Administered 2016-01-16 – 2016-02-02 (×18): 80 mg via ORAL
  Filled 2016-01-16 (×19): qty 1

## 2016-01-16 MED ORDER — SPIRONOLACTONE 25 MG PO TABS
25.0000 mg | ORAL_TABLET | Freq: Every day | ORAL | Status: DC
  Administered 2016-01-17: 25 mg via ORAL
  Filled 2016-01-16 (×2): qty 1

## 2016-01-16 NOTE — H&P (Signed)
History and Physical  [name] [id number] DOB: [birth year] DOA: 01/16/2016  Referring physician: ER Physician PCP: [name], MD  Outpatient Specialists:    Patient coming from: PCP's office  Chief Complaint: Coughing up blood and reported low blood pressure  HPI: 80 year old male with multiple medical and cardiac problems. Patient was seen at the PCP's office and was noted to be hypotensive and coughing up bloody sputum and was advised to come to the hospital for further assessment and management. Patient's BP was said to be 70/40 mmHg at the PCP's office. On presentation to the ER, hypotension was not noted, but patient continues to produce bloody sputum. CT chest without contrast reveals multifocal pneumonia. WBC is elevated, with elevated lactic acid. INR is 2.23. Patient reports taking only plavix and aspirin. Hematuria is noted as well. Prior imaging studies suggested possible bladder cancer. Patient specifically denies fever. No headache, no neck pain, no GI symptoms. Platelet is 100, with elevated MCV. AST is 30 and ALT is 12, with alkaline phosphatase of 27 and Total bilirubin of 0.7.  ED Course: Patient was pancultured and started on IV antibiotics.  Pertinent labs: As in HPI EKG: Independently reviewed.  Imaging: independently reviewed.   Review of Systems: As in HPI. 12 points were reviewed. Negative for fever, visual changes, sore throat, rash, new muscle aches, chest pain, SOB, dysuria, n/v/abdominal pain.  Past Medical History  Diagnosis Date  . Hypertension   . Hyperlipidemia   . GERD (gastroesophageal reflux disease)   . DVT (deep venous thrombosis) ([place]) 2011    Right arm  . Atrial fibrillation ([place])   . Gout   . Depressive disorder   . Internal hemorrhoids without mention of complication   . Gastroparesis   . Osteopenia   . Ischemic colitis ([place])   . Peripheral artery disease ([place])   . Chronic diastolic heart failure ([place])   . Liddle's  syndrome ([place])   . Renal artery stenosis ([place])   . CKD (chronic kidney disease)   . Vitamin B 12 deficiency   . CAD (coronary artery disease)     s/b CABG 1994, and subsequent stents. Repeat CABG 12/2011,  . Prostate cancer ([place]) 1997    XRT and lupron  . Myelodysplastic syndrome ([place]) 05/22/2013    With low hemoglobin and platelets treated with Procrit  . Angiomyolipoma 2009    On both kidneys noted in 2009  . Sick sinus syndrome ([place])   . Peptic ulcer     S/p partial gastrectomy in 1969  . Arthritis     neck and left wrist  . History of hiatal hernia   . Diabetes mellitus type 2 with peripheral artery disease ([place])     DIET CONTROLLED    Past Surgical History  Procedure Laterality Date  . Hiatal hernia repair      and ulcer repair  . Appendectomy  1991  . Pacemaker insertion  10/18/1994    DDD pacemaker, [employer]. Gen change 12/10/2003.  Marland Kitchen Coronary artery bypass graft  01/22/1993  . Cholecystectomy  Oct 2009    Laparoscopic  . Coronary artery bypass graft  01/03/2012    Procedure: REDO CORONARY ARTERY BYPASS GRAFTING (CABG);  Surgeon: Gaye Pollack, MD;  Location: [facility];  Service: Open Heart Surgery;  Laterality: N/A;  Redo CABG x  using bilateral internal mammary arteries;  left leg greater saphenous vein harvested endoscopically  . Celiac artery angioplasty  05-16-12    and stenting  .  Partial gastrectomy  1969    Hx of ulcer s/p partial gastrectomy/ has pernicious anemia  . Other surgical history  02/13/13    superior mesenteric artery angiogram  . Thrombectomy / embolectomy subclavian artery  02/02/10    Right subclavian thromboectomy and venous angioplasty, and chronic mesenteric ischemia with Herculink stenting to superior mesenteric and celiac arteries - Dr. Trula Slade  . Other surgical history  05/16/12    Stent in stomach  . Pacemaker generator change  12/10/2003    SJM Identity XL DR performed by Dr Leonia Reeves  . Visceral angiogram N/A 05/25/2011    Procedure: VISCERAL  ANGIOGRAM;  Surgeon: Serafina Mitchell, MD;  Location[facility] CATH LAB;  Service: Cardiovascular;  Laterality: N/A;  . Abdominal angiogram N/A 05/25/2011    Procedure: ABDOMINAL ANGIOGRAM;  Surgeon: Serafina Mitchell, MD;  Location: [facility] CATH LAB;  Service: Cardiovascular;  Laterality: N/A;  . Lower extremity angiogram Bilateral 05/25/2011    Procedure: LOWER EXTREMITY ANGIOGRAM;  Surgeon: Serafina Mitchell, MD;  Location: [facility] CATH LAB;  Service: Cardiovascular;  Laterality: Bilateral;  . Left heart catheterization with coronary/graft angiogram  12/24/2011    Procedure: LEFT HEART CATHETERIZATION WITH Beatrix Fetters;  Surgeon: Sinclair Grooms, MD;  Location: [facility] CATH LAB;  Service: Cardiovascular;;  . Visceral angiogram Bilateral 12/28/2011    Procedure: VISCERAL ANGIOGRAM;  Surgeon: Serafina Mitchell, MD;  Location[facility] CATH LAB;  Service: Cardiovascular;  Laterality: Bilateral;  . Visceral angiogram N/A 05/16/2012    Procedure: VISCERAL ANGIOGRAM;  Surgeon: Serafina Mitchell, MD;  Location: [facility] C[facility] LAB;  Service: Cardiovascular;  Laterality: N/A;  . Visceral angiogram N/A 02/13/2013    Procedure: VISCERAL ANGIOGRAM;  Surgeon: Serafina Mitchell, MD;  Location: [facility] CATH LAB;  Service: Cardiovascular;  Laterality: N/A;  . Renal angiogram N/A 02/13/2013    Procedure: RENAL ANGIOGRAM;  Surgeon: Serafina Mitchell, MD;  Location: [facility] CATH LAB;  Service: Cardiovascular;  Laterality: N/A;  . Abdominal angiogram N/A 02/13/2013    Procedure: ABDOMINAL ANGIOGRAM;  Surgeon: Serafina Mitchell, MD;  Location: [facility] CATH LAB;  Service: Cardiovascular;  Laterality: N/A;  . Inguinal hernia repair Right 10/28/2015    Procedure: OPEN RIGHT INGUINAL HERNIA REPAIR;  Surgeon: Greer Pickerel, MD;  Location: [facility];  Service: General;  Laterality: Right;  . Insertion of mesh Right 10/28/2015    Procedure: INSERTION OF MESH;  Surgeon: Greer Pickerel, MD;  Location: [facility];  Service: General;  Laterality: Right;     reports that he quit smoking about  46 years ago. His smoking use included Cigarettes. He quit after 20 years of use. He has never used smokeless tobacco. He reports that he does not drink alcohol or use illicit drugs.  Allergies  Allergen Reactions  . Nsaids Other (See Comments)    GI issue  . Ibuprofen Other (See Comments)    GI Issues  . Ace Inhibitors Cough    Family History  Problem Relation Age of Onset  . Diabetes Mother   . Heart disease Mother     Heart Disease before age 69  . Hyperlipidemia Mother   . Hypertension Mother   . CVA Mother 51    cause of death  . Cancer Father     stomach/liver  . Hypertension Father     possibly hypertensive  . Cancer Sister     Breast cancer  . Heart disease Daughter     Heart Disease before age 78  . Hypertension Daughter   .  Heart attack Daughter   . CAD Brother   . Heart disease Brother   . Cancer Paternal Uncle     colon  . Heart disease Sister   . Diabetes Sister   . Hypertension Sister      Prior to Admission medications   Medication Sig Start Date End Date Taking? Authorizing Provider  acetaminophen (ARTHRITIS PAIN RELIEF) 650 MG CR tablet Take 1,300 mg by mouth 3 (three) times daily.     Historical Provider, MD  amiodarone (PACERONE) 200 MG tablet Take one-half tablet by  mouth daily 01/07/16   Belva Crome, MD  aspirin 81 MG tablet Take 1 tablet (81 mg total) by mouth daily. 09/10/14   Belva Crome, MD  atorvastatin (LIPITOR) 80 MG tablet Take 80 mg by mouth at bedtime.     Historical Provider, MD  Calcifediol ER (RAYALDEE) 30 MCG CPCR Take 30 mcg by mouth at bedtime.    Historical Provider, MD  CALCIUM-VITAMIN D PO Take 1 capsule by mouth 2 (two) times daily.    Historical Provider, MD  carvedilol (COREG) 25 MG tablet TAKE 1 TABLET BY MOUTH  TWICE DAILY WITH MEALS 01/07/16   Belva Crome, MD  cloNIDine (CATAPRES) 0.1 MG tablet Take 0.1 mg by mouth 2 (two) times daily. 02/19/15   Historical Provider, MD  clopidogrel (PLAVIX) 75 MG tablet Take 1 tablet  by mouth  daily 01/07/16   Belva Crome, MD  cyanocobalamin (,VITAMIN B-12,) 1000 MCG/ML injection Inject 1,000 mcg into the muscle every 30 (thirty) days. Vitamin B12 - last injection 09/01/15    Historical Provider, MD  epoetin alfa (EPOGEN,PROCRIT) 16109 UNIT/ML injection Inject 10,000 Units into the skin every 14 (fourteen) days. Done at [facility] cancer center - last injection 09/22/15    Historical Provider, MD  furosemide (LASIX) 80 MG tablet Take 80 mg by mouth 2 (two) times daily. Pt takes 1 and 1/2 tablet daily. (140 mg) total    Historical Provider, MD  hydrALAZINE (APRESOLINE) 50 MG tablet Take 1 tablet (50 mg total) by mouth 3 (three) times daily. 11/23/13   Belva Crome, MD  isosorbide mononitrate (IMDUR) 120 MG 24 hr tablet Take 1 tablet by mouth  daily 01/07/16   Belva Crome, MD  leuprolide (LUPRON) 30 MG injection Inject 30 mg into the muscle every 4 (four) months.    Historical Provider, MD  Multiple Vitamin (MULTIVITAMIN WITH MINERALS) TABS tablet Take 1 tablet by mouth every morning.    Historical Provider, MD  potassium chloride SA (K-DUR,KLOR-CON) 20 MEQ tablet Take 20-40 mEq by mouth 3 (three) times daily. Take 2 tablets (40 meq) by mouth with breakfast, take 1 tablet (20 meq) with lunch and supper 06/21/13   Belva Crome, MD  [place]    Historical Provider, MD  silodosin (RAPAFLO) 8 MG CAPS capsule Take 8 mg by mouth at bedtime.     Historical Provider, MD  spironolactone (ALDACTONE) 25 MG tablet Take 25 mg by mouth daily.    Historical Provider, MD    Physical Exam: Filed Vitals:   01/16/16 1408 01/16/16 1430 01/16/16 1500 01/16/16 1638  BP: 142/63 131/59  141/61  Pulse: 64 65 64 68  Temp:    99 F (37.2 C)  TempSrc:    Oral  Resp: 19 17 14 17   SpO2: 93% 94% 95% 96%   Constitutional:  . Appears calm and comfortable. Mid line prior CABG Scar. Pacemaker right upper lateral  chest wall anteriorly Eyes:  . Pallor. No jaundice.  ENMT:   . external ears, nose appear normal Neck:  . Neck is supple. No JVD Respiratory:  Decreased air entry.   Cardiovascular:  . S1S2 . No LE extremity edema   Abdomen:  . Abdomen is soft and non tender. Organs are difficult to assess. Neurologic:  . Awake and alert. . Moves all limbs.  Wt Readings from Last 3 Encounters:  11/07/15 70.852 kg (156 lb 3.2 oz)  10/28/15 71.215 kg (157 lb)  10/20/15 73.256 kg (161 lb 8 oz)    I have personally reviewed following labs and imaging studies  Labs on Admission:  CBC:  Recent Labs Lab 01/12/16 1007 01/16/16 1239  WBC 3.3* 13.1*  NEUTROABS 1.4* 11.0*  HGB 9.3* 9.7*  HCT 28.3* 30.4*  MCV 101.1* 102.4*  PLT 88* 123XX123*   Basic Metabolic Panel:  Recent Labs Lab 01/16/16 1239  NA 139  K 3.5  CL 105  CO2 21*  GLUCOSE 141*  BUN 67*  CREATININE 3.48*  CALCIUM 9.5   Liver Function Tests:  Recent Labs Lab 01/16/16 1239  AST 30  ALT 12*  ALKPHOS 27*  BILITOT 0.7  PROT 7.6  ALBUMIN 4.2   No results for input(s): LIPASE, AMYLASE in the last 168 hours. No results for input(s): AMMONIA in the last 168 hours. Coagulation Profile: No results for input(s): INR, PROTIME in the last 168 hours. Cardiac Enzymes: No results for input(s): CKTOTAL, CKMB, CKMBINDEX, TROPONINI in the last 168 hours. BNP (last 3 results) No results for input(s): PROBNP in the last 8760 hours. HbA1C: No results for input(s): HGBA1C in the last 72 hours. CBG: No results for input(s): GLUCAP in the last 168 hours. Lipid Profile: No results for input(s): CHOL, HDL, LDLCALC, TRIG, CHOLHDL, LDLDIRECT in the last 72 hours. Thyroid Function Tests: No results for input(s): TSH, T4TOTAL, FREET4, T3FREE, THYROIDAB in the last 72 hours. Anemia Panel: No results for input(s): VITAMINB12, FOLATE, FERRITIN, TIBC, IRON, RETICCTPCT in the last 72 hours. Urine analysis:    Component Value Date/Time   COLORURINE YELLOW 01/16/2016 1419   APPEARANCEUR CLOUDY*  01/16/2016 1419   LABSPEC 1.015 01/16/2016 1419   LABSPEC 1.010 05/19/2015 1231   PHURINE 5.5 01/16/2016 1419   PHURINE 6.0 05/19/2015 1231   GLUCOSEU NEGATIVE 01/16/2016 1419   GLUCOSEU Negative 05/19/2015 1231   HGBUR LARGE* 01/16/2016 1419   HGBUR Negative 05/19/2015 1231   BILIRUBINUR NEGATIVE 01/16/2016 1419   BILIRUBINUR Negative 05/19/2015 1231   KETONESUR NEGATIVE 01/16/2016 1419   KETONESUR Negative 05/19/2015 1231   PROTEINUR 100* 01/16/2016 1419   PROTEINUR < 30 05/19/2015 1231   UROBILINOGEN 0.2 05/19/2015 1231   UROBILINOGEN 0.2 04/03/2014 1911   NITRITE NEGATIVE 01/16/2016 1419   NITRITE Negative 05/19/2015 1231   LEUKOCYTESUR NEGATIVE 01/16/2016 1419   LEUKOCYTESUR Negative 05/19/2015 1231   Sepsis Labs: @LABRCNTIP (procalcitonin:4,lacticidven:4) )No results found for this or any previous visit (from the past 240 hour(s)).    Radiological Exams on Admission: Dg Chest 2 View  01/16/2016  CLINICAL DATA:  Chest pain, cough for 2 days EXAM: CHEST  2 VIEW COMPARISON:  CT chest 04/03/2014 FINDINGS: Cardiomediastinal silhouette is unremarkable. Status post median sternotomy. Dual lead cardiac pacemaker with leads in right atrium and right ventricle. There is streaky airspace opacification in right base infrahilar region and right upper lobe peripheral perihilar. Findings highly suspicious for pneumonia. Probable small right pleural effusion. Left lung is clear. IMPRESSION: There is streaky airspace  opacification in right base infrahilar region and right upper lobe peripheral perihilar. Findings highly suspicious for pneumonia. Followup PA and lateral chest X-ray is recommended in 3-4 weeks following trial of antibiotic therapy to ensure resolution and exclude underlying malignancy. Probable small right pleural effusion. Electronically Signed   By: Lahoma Crocker M.D.   On: 01/16/2016 13:58   Ct Chest Wo Contrast  01/16/2016  CLINICAL DATA:  Hemoptysis, cough.  Symptoms since Monday.  EXAM: CT CHEST WITHOUT CONTRAST TECHNIQUE: TECHNIQUE Multidetector CT imaging of the chest was performed following the standard protocol without IV contrast. COMPARISON:  Chest radiograph 01/16/2016, CT 04/03/2014 FINDINGS: Cardiovascular: Calcification of the thoracic aorta. Coronary bypass graft. Pacer wires in the RIGHT heart. No pericardial fluid. Mediastinum/Nodes: No axillary or supraclavicular adenopathy. No mediastinal hilar adenopathy. Esophagus normal. Lungs/Pleura: There is consolidative airspace disease in the RIGHT lower lobe with air bronchograms. A similar pattern in the posterior aspect the RIGHT upper lobe. Smaller pattern of consolidation in the LEFT lower lobe. Findings are consistent with multifocal pneumonia. Upper Abdomen: Limited view of the liver, kidneys, pancreas are unremarkable. Normal adrenal glands. Musculoskeletal: No aggressive osseous lesion.  Midline sternotomy. IMPRESSION: 1. Multifocal pneumonia with most dense infection in RIGHT lower lobe. 2. Coronary artery calcification and aortic atherosclerotic calcification. Electronically Signed   By: Suzy Bouchard M.D.   On: 01/16/2016 16:32    EKG: Independently reviewed.   Active Problems:   CAP (community acquired pneumonia)   Pneumonia   Assessment/Plan 1. Multifocal Pneumonia 2. Bloody sputum 3. Hematuria 4. Elevated INR 5. Thrombocytopenia 6. CKD 4   Admit patient  Pan culture patient  IV antibiotics  Renal ultrasound  DIC Profile  Prolactin level  Sputum for AAFB  Low threshold to consult pulmonary, Hematology and Urology (if bladder or renal pathology is noted)  Further management will depend on hospital course  DVT prophylaxis: SCD Code Status: Full Family Communication: Disposition Plan: To be determined   Consults called: Have a lo threshold to consult pulmonary and/or Hematology   Admission status: Inpatient    Time spent: 65 minutes  Dana Allan, MD  [facility] Pager #: [phone] 7PM-7AM contact night coverage as above   01/16/2016, 5:17 PM

## 2016-01-16 NOTE — ED Provider Notes (Signed)
CSN: [id number]     Arrival date & time 01/16/16  1224 History   First MD Initiated Contact with Patient 01/16/16 1224     Chief Complaint  Patient presents with  . Cough     (Consider location/radiation/quality/duration/timing/severity/associated sxs/prior Treatment) HPI [name]. is a 80 y.o. male with history of A. fib, hypertension, DVT, hypertension, coronary artery disease, renal artery stenosis, history of ischemic colitis, chronic kidney disease, prostate cancer, CHF, presents to emergency department complaining of cough for 5 days. Patient states cough is productive of bloody sputum. Reports intermittent chest discomfort. Reports some shortness of breath. Patient was seen by primary care doctor today at [place] office, where he was found to have blood pressure 70/40. Temperature there was 100. He was sent here for sepsis workup. Denies any chest pain at this time. Patient denies any abdominal pain, back pain, swelling in extremities.   Past Medical History  Diagnosis Date  . Hypertension   . Hyperlipidemia   . GERD (gastroesophageal reflux disease)   . DVT (deep venous thrombosis) ([place]) 2011    Right arm  . Atrial fibrillation ([place])   . Gout   . Depressive disorder   . Internal hemorrhoids without mention of complication   . Gastroparesis   . Osteopenia   . Ischemic colitis ([place])   . Peripheral artery disease ([place])   . Chronic diastolic heart failure ([place])   . Liddle's syndrome ([place])   . Renal artery stenosis ([place])   . CKD (chronic kidney disease)   . Vitamin B 12 deficiency   . CAD (coronary artery disease)     s/b CABG 1994, and subsequent stents. Repeat CABG 12/2011,  . Prostate cancer ([place]) 1997    XRT and lupron  . Myelodysplastic syndrome ([place]) 05/22/2013    With low hemoglobin and platelets treated with Procrit  . Angiomyolipoma 2009    On both kidneys noted in 2009  . Sick sinus syndrome ([place])   . Peptic ulcer     S/p partial gastrectomy in  1969  . Arthritis     neck and left wrist  . History of hiatal hernia   . Diabetes mellitus type 2 with peripheral artery disease ([place])     DIET CONTROLLED   Past Surgical History  Procedure Laterality Date  . Hiatal hernia repair      and ulcer repair  . Appendectomy  1991  . Pacemaker insertion  10/18/1994    DDD pacemaker, [employer]. Gen change 12/10/2003.  Marland Kitchen Coronary artery bypass graft  01/22/1993  . Cholecystectomy  Oct 2009    Laparoscopic  . Coronary artery bypass graft  01/03/2012    Procedure: REDO CORONARY ARTERY BYPASS GRAFTING (CABG);  Surgeon: Gaye Pollack, MD;  Location: [facility];  Service: Open Heart Surgery;  Laterality: N/A;  Redo CABG x  using bilateral internal mammary arteries;  left leg greater saphenous vein harvested endoscopically  . Celiac artery angioplasty  05-16-12    and stenting  . Partial gastrectomy  1969    Hx of ulcer s/p partial gastrectomy/ has pernicious anemia  . Other surgical history  02/13/13    superior mesenteric artery angiogram  . Thrombectomy / embolectomy subclavian artery  02/02/10    Right subclavian thromboectomy and venous angioplasty, and chronic mesenteric ischemia with Herculink stenting to superior mesenteric and celiac arteries - Dr. Trula Slade  . Other surgical history  05/16/12    Stent in stomach  . Pacemaker generator change  12/10/2003    SJM Identity XL DR performed by Dr Leonia Reeves  . Visceral angiogram N/A 05/25/2011    Procedure: VISCERAL ANGIOGRAM;  Surgeon: Serafina Mitchell, MD;  Location: [facility] CATH LAB;  Service: Cardiovascular;  Laterality: N/A;  . Abdominal angiogram N/A 05/25/2011    Procedure: ABDOMINAL ANGIOGRAM;  Surgeon: Serafina Mitchell, MD;  Location: [facility] CATH LAB;  Service: Cardiovascular;  Laterality: N/A;  . Lower extremity angiogram Bilateral 05/25/2011    Procedure: LOWER EXTREMITY ANGIOGRAM;  Surgeon: Serafina Mitchell, MD;  Location: [facility] CATH LAB;  Service: Cardiovascular;  Laterality: Bilateral;  . Left heart  catheterization with coronary/graft angiogram  12/24/2011    Procedure: LEFT HEART CATHETERIZATION WITH Beatrix Fetters;  Surgeon: Sinclair Grooms, MD;  Location: [facility] CATH LAB;  Service: Cardiovascular;;  . Visceral angiogram Bilateral 12/28/2011    Procedure: VISCERAL ANGIOGRAM;  Surgeon: Serafina Mitchell, MD;  Location: [facility] CATH LAB;  Service: Cardiovascular;  Laterality: Bilateral;  . Visceral angiogram N/A 05/16/2012    Procedure: VISCERAL ANGIOGRAM;  Surgeon: Serafina Mitchell, MD;  Location: [facility] CATH LAB;  Service: Cardiovascular;  Laterality: N/A;  . Visceral angiogram N/A 02/13/2013    Procedure: VISCERAL ANGIOGRAM;  Surgeon: Serafina Mitchell, MD;  Location: [facility] CATH LAB;  Service: Cardiovascular;  Laterality: N/A;  . Renal angiogram N/A 02/13/2013    Procedure: RENAL ANGIOGRAM;  Surgeon: Serafina Mitchell, MD;  Location: [facility] CATH LAB;  Service: Cardiovascular;  Laterality: N/A;  . Abdominal angiogram N/A 02/13/2013    Procedure: ABDOMINAL ANGIOGRAM;  Surgeon: Serafina Mitchell, MD;  Location: [facility] CATH LAB;  Service: Cardiovascular;  Laterality: N/A;  . Inguinal hernia repair Right 10/28/2015    Procedure: OPEN RIGHT INGUINAL HERNIA REPAIR;  Surgeon: Greer Pickerel, MD;  Location: [facility];  Service: General;  Laterality: Right;  . Insertion of mesh Right 10/28/2015    Procedure: INSERTION OF MESH;  Surgeon: Greer Pickerel, MD;  Location: [facility];  Service: General;  Laterality: Right;   Family History  Problem Relation Age of Onset  . Diabetes Mother   . Heart disease Mother     Heart Disease before age 73  . Hyperlipidemia Mother   . Hypertension Mother   . CVA Mother 76    cause of death  . Cancer Father     stomach/liver  . Hypertension Father     possibly hypertensive  . Cancer Sister     Breast cancer  . Heart disease Daughter     Heart Disease before age 15  . Hypertension Daughter   . Heart attack Daughter   . CAD Brother   . Heart disease Brother   . Cancer Paternal Uncle     colon   . Heart disease Sister   . Diabetes Sister   . Hypertension Sister    Social History  Substance Use Topics  . Smoking status: Former Smoker -- 20 years    Types: Cigarettes    Quit date: 07/12/1969  . Smokeless tobacco: Never Used  . Alcohol Use: No    Review of Systems  Constitutional: Negative for fever and chills.  Respiratory: Positive for cough, chest tightness and shortness of breath.   Cardiovascular: Positive for chest pain. Negative for palpitations and leg swelling.  Gastrointestinal: Negative for nausea, vomiting, abdominal pain, diarrhea and abdominal distention.  Genitourinary: Negative for dysuria, urgency, frequency and hematuria.  Musculoskeletal: Negative for myalgias, arthralgias, neck pain and neck stiffness.  Skin: Negative for rash.  Allergic/Immunologic: Negative  for immunocompromised state.  Neurological: Negative for dizziness, weakness, light-headedness, numbness and headaches.      Allergies  Nsaids; Ibuprofen; and Ace inhibitors  Home Medications   Prior to Admission medications   Medication Sig Start Date End Date Taking? Authorizing Provider  acetaminophen (ARTHRITIS PAIN RELIEF) 650 MG CR tablet Take 1,300 mg by mouth 3 (three) times daily.     Historical Provider, MD  amiodarone (PACERONE) 200 MG tablet Take one-half tablet by  mouth daily 01/07/16   Belva Crome, MD  aspirin 81 MG tablet Take 1 tablet (81 mg total) by mouth daily. 09/10/14   Belva Crome, MD  atorvastatin (LIPITOR) 80 MG tablet Take 80 mg by mouth at bedtime.     Historical Provider, MD  Calcifediol ER (RAYALDEE) 30 MCG CPCR Take 30 mcg by mouth at bedtime.    Historical Provider, MD  CALCIUM-VITAMIN D PO Take 1 capsule by mouth 2 (two) times daily.    Historical Provider, MD  carvedilol (COREG) 25 MG tablet TAKE 1 TABLET BY MOUTH  TWICE DAILY WITH MEALS 01/07/16   Belva Crome, MD  cloNIDine (CATAPRES) 0.1 MG tablet Take 0.1 mg by mouth 2 (two) times daily. 02/19/15    Historical Provider, MD  clopidogrel (PLAVIX) 75 MG tablet Take 1 tablet by mouth  daily 01/07/16   Belva Crome, MD  cyanocobalamin (,VITAMIN B-12,) 1000 MCG/ML injection Inject 1,000 mcg into the muscle every 30 (thirty) days. Vitamin B12 - last injection 09/01/15    Historical Provider, MD  epoetin alfa (EPOGEN,PROCRIT) 09811 UNIT/ML injection Inject 10,000 Units into the skin every 14 (fourteen) days. Done at [facility] - last injection 09/22/15    Historical Provider, MD  furosemide (LASIX) 80 MG tablet Take 80 mg by mouth 2 (two) times daily. Pt takes 1 and 1/2 tablet daily. (140 mg) total    Historical Provider, MD  hydrALAZINE (APRESOLINE) 50 MG tablet Take 1 tablet (50 mg total) by mouth 3 (three) times daily. 11/23/13   Belva Crome, MD  isosorbide mononitrate (IMDUR) 120 MG 24 hr tablet Take 1 tablet by mouth  daily 01/07/16   Belva Crome, MD  leuprolide (LUPRON) 30 MG injection Inject 30 mg into the muscle every 4 (four) months.    Historical Provider, MD  Multiple Vitamin (MULTIVITAMIN WITH MINERALS) TABS tablet Take 1 tablet by mouth every morning.    Historical Provider, MD  potassium chloride SA (K-DUR,KLOR-CON) 20 MEQ tablet Take 20-40 mEq by mouth 3 (three) times daily. Take 2 tablets (40 meq) by mouth with breakfast, take 1 tablet (20 meq) with lunch and supper 06/21/13   Belva Crome, MD  [place]    Historical Provider, MD  silodosin (RAPAFLO) 8 MG CAPS capsule Take 8 mg by mouth at bedtime.     Historical Provider, MD  spironolactone (ALDACTONE) 25 MG tablet Take 25 mg by mouth daily.    Historical Provider, MD   SpO2 94% Physical Exam  Constitutional: He is oriented to person, place, and time. He appears well-developed and well-nourished. No distress.  HENT:  Head: Normocephalic and atraumatic.  Eyes: Conjunctivae are normal.  Neck: Normal range of motion. Neck supple.  Cardiovascular: Normal rate, regular rhythm and normal  heart sounds.   Pulmonary/Chest: Effort normal. No respiratory distress. He has wheezes. He has no rales.  ronchi  Abdominal: Soft. Bowel sounds are normal. He exhibits no distension. There is no tenderness. There is no  rebound.  Musculoskeletal: He exhibits no edema.  Neurological: He is alert and oriented to person, place, and time.  Skin: Skin is warm and dry.  Nursing note and vitals reviewed.   ED Course  Procedures (including critical care time) Labs Review Labs Reviewed  CBC WITH DIFFERENTIAL/PLATELET - Abnormal; Notable for the following:    WBC 13.1 (*)    RBC 2.97 (*)    Hemoglobin 9.7 (*)    HCT 30.4 (*)    MCV 102.4 (*)    RDW 17.1 (*)    Platelets 100 (*)    Neutro Abs 11.0 (*)    Monocytes Absolute 1.4 (*)    All other components within normal limits  COMPREHENSIVE METABOLIC PANEL - Abnormal; Notable for the following:    CO2 21 (*)    Glucose, Bld 141 (*)    BUN 67 (*)    Creatinine, Ser 3.48 (*)    ALT 12 (*)    Alkaline Phosphatase 27 (*)    GFR calc non Af Amer 15 (*)    GFR calc Af Amer 18 (*)    All other components within normal limits  URINALYSIS, ROUTINE W REFLEX MICROSCOPIC (NOT AT [facility]) - Abnormal; Notable for the following:    APPearance CLOUDY (*)    Hgb urine dipstick LARGE (*)    Protein, ur 100 (*)    All other components within normal limits  D-DIMER, QUANTITATIVE (NOT AT [facility]) - Abnormal; Notable for the following:    D-Dimer, Quant 2.23 (*)    All other components within normal limits  URINE MICROSCOPIC-ADD ON - Abnormal; Notable for the following:    Squamous Epithelial / LPF 0-5 (*)    All other components within normal limits  I-STAT CG4 LACTIC ACID, ED - Abnormal; Notable for the following:    Lactic Acid, Venous 2.43 (*)    All other components within normal limits  CULTURE, BLOOD (ROUTINE X 2)  CULTURE, BLOOD (ROUTINE X 2)  I-STAT TROPOININ, ED  I-STAT CG4 LACTIC ACID, ED    Imaging Review Dg Chest 2 View  01/16/2016   CLINICAL DATA:  Chest pain, cough for 2 days EXAM: CHEST  2 VIEW COMPARISON:  CT chest 04/03/2014 FINDINGS: Cardiomediastinal silhouette is unremarkable. Status post median sternotomy. Dual lead cardiac pacemaker with leads in right atrium and right ventricle. There is streaky airspace opacification in right base infrahilar region and right upper lobe peripheral perihilar. Findings highly suspicious for pneumonia. Probable small right pleural effusion. Left lung is clear. IMPRESSION: There is streaky airspace opacification in right base infrahilar region and right upper lobe peripheral perihilar. Findings highly suspicious for pneumonia. Followup PA and lateral chest X-ray is recommended in 3-4 weeks following trial of antibiotic therapy to ensure resolution and exclude underlying malignancy. Probable small right pleural effusion. Electronically Signed   By: Lahoma Crocker M.D.   On: 01/16/2016 13:58   I have personally reviewed and evaluated these images and lab results as part of my medical decision-making.   EKG Interpretation   Date/Time:  Friday January 16 2016 14:46:19 EDT Ventricular Rate:  63 PR Interval:    QRS Duration: 107 QT Interval:  447 QTC Calculation: 458 R Axis:   84 Text Interpretation:  Sinus rhythm Borderline right axis deviation  Borderline repolarization abnormality Baseline wander in lead(s) V2 No  significant change since last tracing Confirmed by KNAPP  MD-J, JON  [id number]) on 01/16/2016 2:49:27 PM Also confirmed by KNAPP  MD-J, JON [id number]),  editor Yehuda Mao [phone])  on 01/16/2016 3:04:00 PM      MDM   Final diagnoses:  CAP (community acquired pneumonia)  Hemoptysis  Renal insufficiency   Patient emergency department complaining of cough for 5 days, generalized weakness, hemoptysis. He was sent here from primary care doctor's office with blood pressure of 70/40, oxygen saturation 92%. Here, blood pressure is normal, patient is not tachycardic, afebrile, rectal  temperature 99.3. Does not need to search for sepsis criteria at this time. Will start antibiotics for pneumonia, lab work and chest x-ray ordered.  Lactic acid slightly elevated at 2.43. White blood cell count is 13.1. Hemoglobin 9.7 which is at baseline. Chest x-ray showed pneumonia. D-dimer elevated at 2.23, she unable to have CT angina due to elevated creatinine which is 3.48, which is at baseline. Will admit patient for treatment of pneumonia. Patient may require VQ scan.  Discussed with hospitalist, they will admit.  Filed Vitals:   01/16/16 1323 01/16/16 1400 01/16/16 1408 01/16/16 1430  BP:  142/63 142/63 131/59  Pulse:  70 64 65  Temp: 99.3 F (37.4 C)     TempSrc: Rectal     Resp:  13 19 17   SpO2:  93% 93% 94%       Jeannett Senior, PA-C 01/16/16 1528  Dorie Rank, MD 01/16/16 1614

## 2016-01-16 NOTE — Progress Notes (Signed)
Pt transferred to 3e18 at this time.  No s/s of any acute distress noted.

## 2016-01-16 NOTE — Progress Notes (Signed)
Pt transferred from 5W. Pt brought to the floor in stable condition. Vitals taken. Important safety instructions relating to hospitalization reviewed with patient. Patient verbalized understanding. Family and patient given education on airborne isolation. Pt does have fever of 100.1. MD notified. Patient in no distress.

## 2016-01-16 NOTE — ED Notes (Signed)
Pt to ER BIB [facility] from [place] where patient was being seen for one week of productive cough, weakness, and some chest discomfort. Pt sent here due to BP 70/40, positive for fever. Pt reports noticing blood in sputum. Pt is alert and oriented x4. Arriving on 2L nasal cannula for sats at 92% on RA.

## 2016-01-16 NOTE — Progress Notes (Signed)
Report given to 3E nurse

## 2016-01-17 ENCOUNTER — Inpatient Hospital Stay (HOSPITAL_COMMUNITY): Payer: Medicare Other

## 2016-01-17 DIAGNOSIS — Z950 Presence of cardiac pacemaker: Secondary | ICD-10-CM

## 2016-01-17 DIAGNOSIS — R7881 Bacteremia: Secondary | ICD-10-CM | POA: Diagnosis present

## 2016-01-17 DIAGNOSIS — J15211 Pneumonia due to Methicillin susceptible Staphylococcus aureus: Secondary | ICD-10-CM

## 2016-01-17 LAB — BLOOD CULTURE ID PANEL (REFLEXED)
Acinetobacter baumannii: NOT DETECTED
CANDIDA KRUSEI: NOT DETECTED
CANDIDA PARAPSILOSIS: NOT DETECTED
CANDIDA TROPICALIS: NOT DETECTED
CARBAPENEM RESISTANCE: NOT DETECTED
Candida albicans: NOT DETECTED
Candida glabrata: NOT DETECTED
ENTEROCOCCUS SPECIES: NOT DETECTED
Enterobacter cloacae complex: NOT DETECTED
Enterobacteriaceae species: NOT DETECTED
Escherichia coli: NOT DETECTED
Haemophilus influenzae: NOT DETECTED
KLEBSIELLA OXYTOCA: NOT DETECTED
KLEBSIELLA PNEUMONIAE: NOT DETECTED
LISTERIA MONOCYTOGENES: NOT DETECTED
Methicillin resistance: NOT DETECTED
Neisseria meningitidis: NOT DETECTED
Proteus species: NOT DETECTED
Pseudomonas aeruginosa: NOT DETECTED
SERRATIA MARCESCENS: NOT DETECTED
STAPHYLOCOCCUS AUREUS BCID: DETECTED — AB
STAPHYLOCOCCUS SPECIES: DETECTED — AB
Streptococcus agalactiae: NOT DETECTED
Streptococcus pneumoniae: NOT DETECTED
Streptococcus pyogenes: NOT DETECTED
Streptococcus species: NOT DETECTED
VANCOMYCIN RESISTANCE: NOT DETECTED

## 2016-01-17 LAB — PROLACTIN: Prolactin: 6.9 ng/mL (ref 4.0–15.2)

## 2016-01-17 LAB — EXPECTORATED SPUTUM ASSESSMENT W REFEX TO RESP CULTURE

## 2016-01-17 LAB — PROTIME-INR
INR: 1.28 (ref 0.00–1.49)
Prothrombin Time: 16.1 seconds — ABNORMAL HIGH (ref 11.6–15.2)

## 2016-01-17 LAB — STREP PNEUMONIAE URINARY ANTIGEN: Strep Pneumo Urinary Antigen: NEGATIVE

## 2016-01-17 LAB — EXPECTORATED SPUTUM ASSESSMENT W GRAM STAIN, RFLX TO RESP C

## 2016-01-17 MED ORDER — RIFAMPIN 300 MG PO CAPS
600.0000 mg | ORAL_CAPSULE | Freq: Every day | ORAL | Status: DC
  Administered 2016-01-17 – 2016-02-03 (×18): 600 mg via ORAL
  Filled 2016-01-17 (×19): qty 2

## 2016-01-17 MED ORDER — CEFAZOLIN SODIUM-DEXTROSE 2-4 GM/100ML-% IV SOLN
2.0000 g | Freq: Two times a day (BID) | INTRAVENOUS | Status: DC
  Administered 2016-01-17 – 2016-01-20 (×7): 2 g via INTRAVENOUS
  Filled 2016-01-17 (×10): qty 100

## 2016-01-17 MED ORDER — ACETAMINOPHEN 325 MG PO TABS
650.0000 mg | ORAL_TABLET | Freq: Four times a day (QID) | ORAL | Status: DC | PRN
  Administered 2016-01-17 – 2016-01-22 (×7): 650 mg via ORAL
  Filled 2016-01-17 (×7): qty 2

## 2016-01-17 NOTE — Consult Note (Signed)
[facility]    Date of Admission:  01/16/2016    Total days of antibiotics 2        Day 1 cefazolin        Day 1 rifampin              Reason for Consult: Automatic consultation for staph aureus bacteremia     Principal Problem:   Staphylococcus aureus bacteremia Active Problems:   CAP (community acquired pneumonia)   Pacemaker   Pneumonia   . amiodarone  100 mg Oral Daily  . atorvastatin  80 mg Oral QHS  . Calcifediol ER  30 mcg Oral QHS  . carvedilol  25 mg Oral BID WC  .  ceFAZolin (ANCEF) IV  2 g Intravenous Q12H  . cloNIDine  0.1 mg Oral BID  . hydrALAZINE  50 mg Oral TID  . isosorbide mononitrate  120 mg Oral Daily  . multivitamin with minerals  1 tablet Oral q morning - 10a  . rifampin  600 mg Oral Daily  . tamsulosin  0.4 mg Oral QPC supper  . tuberculin  5 Units Intradermal Once    Recommendations: 1. Continue cefazolin and rifampin 2. Repeat blood cultures 3. Hold off on PICC placement until blood cultures are negative 4. Transthoracic echocardiogram   Assessment: [name] has MSSA bacteremia complicated by right lower lobe pneumonia. Given the wedge shaped peripheral location of his infiltrate and his hemoptysis unconcerned about septic pulmonary emboli with infarction. He is at relatively high risk for pacemaker endocarditis. I will treat with cefazolin and oral rifampin pending repeat blood cultures and echocardiogram.    HPI: [name]. is a 79 y.o. male with multiple chronic medical problems. He has had anorexia and about 11 pound weight loss since April. Six days ago he developed cough productive of dark sputum associated with some shortness of breath. He has not had any fevers that he is aware of that he has had some shaking chills. His cough has progressively worsened and yesterday he started coughing up some blood leading to his admission. Chest x-ray and CT so a right lower lobe infiltrate. Both  admission blood cultures have already grown MSSA. He has a history of bradycardia and a permanent pacemaker.   Review of Systems: Review of Systems  Constitutional: Positive for chills, weight loss and malaise/fatigue. Negative for fever and diaphoresis.  HENT: Negative for sore throat.   Respiratory: Positive for cough, hemoptysis, sputum production and shortness of breath.   Cardiovascular: Negative for chest pain.  Gastrointestinal: Positive for abdominal pain. Negative for heartburn, nausea, vomiting and diarrhea.  Genitourinary: Negative for dysuria.  Musculoskeletal: Negative for myalgias and joint pain.  Skin: Negative for rash.  Neurological: Positive for weakness. Negative for headaches.    Past Medical History  Diagnosis Date  . Hypertension   . Hyperlipidemia   . GERD (gastroesophageal reflux disease)   . DVT (deep venous thrombosis) ([place]) 2011    Right arm  . Atrial fibrillation ([place])   . Gout   . Depressive disorder   . Internal hemorrhoids without mention of complication   . Gastroparesis   . Osteopenia   . Ischemic colitis ([place])   . Peripheral artery disease ([place])   . Chronic diastolic heart failure ([place])   . 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 Renal artery stenosis ([place])   . CKD (chronic kidney disease)   . Vitamin B 12  deficiency   . CAD (coronary artery disease)     s/b CABG 1994, and subsequent stents. Repeat CABG 12/2011,  . Prostate cancer ([place]) 1997    XRT and lupron  . Myelodysplastic syndrome ([place]) 05/22/2013    With low hemoglobin and platelets treated with Procrit  . Angiomyolipoma 2009    On both kidneys noted in 2009  . Sick sinus syndrome ([place])   . Peptic ulcer     S/p partial gastrectomy in 1969  . Arthritis     neck and left wrist  . History of hiatal hernia   . Diabetes mellitus type 2 with peripheral artery disease ([facility])     DIET CONTROLLED  . Pneumonia 01/16/2016    Social History  Substance Use Topics  . Smoking status: Former Smoker  -- 20 years    Types: Cigarettes    Quit date: 07/12/1969  . Smokeless tobacco: Never Used  . Alcohol Use: No    Family History  Problem Relation Age of Onset  . Diabetes Mother   . Heart disease Mother     Heart Disease before age 58  . Hyperlipidemia Mother   . Hypertension Mother   . CVA Mother 59    cause of death  . Cancer Father     stomach/liver  . Hypertension Father     possibly hypertensive  . Cancer Sister     Breast cancer  . Heart disease Daughter     Heart Disease before age 37  . Hypertension Daughter   . Heart attack Daughter   . CAD Brother   . Heart disease Brother   . Cancer Paternal Uncle     colon  . Heart disease Sister   . Diabetes Sister   . Hypertension Sister    Allergies  Allergen Reactions  . Nsaids Other (See Comments)    GI issue  . Ibuprofen Other (See Comments)    GI Issues  . Ace Inhibitors Cough    OBJECTIVE: Blood pressure 123/49, pulse 63, temperature 99 F (37.2 C), temperature source Oral, resp. rate 18, height 5\' 10"  (1.778 m), weight 144 lb 9.6 oz (65.59 kg), SpO2 92 %.  Physical Exam  Constitutional: He is oriented to person, place, and time.  He appears weak but in no distress. Multiple family members are at the bedside.  HENT:  Mouth/Throat: No oropharyngeal exudate.  Eyes: Conjunctivae are normal.  Cardiovascular: Normal rate and regular rhythm.   Murmur heard. 1/6 early systolic murmur.  Pulmonary/Chest: Effort normal. He has no wheezes. He has rales.  Abdominal: Soft. There is no tenderness.  Musculoskeletal: Normal range of motion. He exhibits no edema or tenderness.  Neurological: He is alert and oriented to person, place, and time.  Skin: No rash noted.  No splinter hemorrhages.  Psychiatric: Mood and affect normal.    Lab Results Lab Results  Component Value Date   WBC 13.1* 01/16/2016   HGB 9.7* 01/16/2016   HCT 30.4* 01/16/2016   MCV 102.4* 01/16/2016   PLT 95* 01/16/2016    Lab Results    Component Value Date   CREATININE 3.48* 01/16/2016   BUN 67* 01/16/2016   NA 139 01/16/2016   K 3.5 01/16/2016   CL 105 01/16/2016   CO2 21* 01/16/2016    Lab Results  Component Value Date   ALT 12* 01/16/2016   AST 30 01/16/2016   ALKPHOS 27* 01/16/2016   BILITOT 0.7 01/16/2016     Microbiology: Recent Results (from the  past 240 hour(s))  Blood culture (routine x 2)     Status: None (Preliminary result)   Collection Time: 01/16/16  1:00 PM  Result Value Ref Range Status   Specimen Description LEFT ANTECUBITAL  Final   Special Requests BOTTLES DRAWN AEROBIC AND ANAEROBIC 10CC  Final   Culture  Setup Time   Final    GRAM POSITIVE COCCI IN CLUSTERS IN BOTH AEROBIC AND ANAEROBIC BOTTLES CRITICAL RESULT CALLED TO, READ BACK BY AND VERIFIED WITH: J MARKLE 01/17/16 @ 0927 M VESTAL    Culture GRAM POSITIVE COCCI  Final   Report Status PENDING  Incomplete  Blood culture (routine x 2)     Status: None (Preliminary result)   Collection Time: 01/16/16  1:06 PM  Result Value Ref Range Status   Specimen Description BLOOD LEFT HAND  Final   Special Requests AEROBIC BOTTLE ONLY 10CC  Final   Culture  Setup Time   Final    GRAM POSITIVE COCCI IN CLUSTERS AEROBIC BOTTLE ONLY Organism ID to follow CRITICAL RESULT CALLED TO, READ BACK BY AND VERIFIED WITH: J MARKLE 01/17/16 @ 0927 M VESTAL    Culture GRAM POSITIVE COCCI  Final   Report Status PENDING  Incomplete  Blood Culture ID Panel (Reflexed)     Status: Abnormal   Collection Time: 01/16/16  1:06 PM  Result Value Ref Range Status   Enterococcus species NOT DETECTED NOT DETECTED Final   Vancomycin resistance NOT DETECTED NOT DETECTED Final   Listeria monocytogenes NOT DETECTED NOT DETECTED Final   Staphylococcus species DETECTED (A) NOT DETECTED Final    Comment: CRITICAL RESULT CALLED TO, READ BACK BY AND VERIFIED WITH: J MARKLE 01/17/16 @ 0927 M VESTAL    Staphylococcus aureus DETECTED (A) NOT DETECTED Final    Comment: CRITICAL  RESULT CALLED TO, READ BACK BY AND VERIFIED WITH: J MARKLE 01/17/16 @ 0927 M VESTAL    Methicillin resistance NOT DETECTED NOT DETECTED Final   Streptococcus species NOT DETECTED NOT DETECTED Final   Streptococcus agalactiae NOT DETECTED NOT DETECTED Final   Streptococcus pneumoniae NOT DETECTED NOT DETECTED Final   Streptococcus pyogenes NOT DETECTED NOT DETECTED Final   Acinetobacter baumannii NOT DETECTED NOT DETECTED Final   Enterobacteriaceae species NOT DETECTED NOT DETECTED Final   Enterobacter cloacae complex NOT DETECTED NOT DETECTED Final   Escherichia coli NOT DETECTED NOT DETECTED Final   Klebsiella oxytoca NOT DETECTED NOT DETECTED Final   Klebsiella pneumoniae NOT DETECTED NOT DETECTED Final   Proteus species NOT DETECTED NOT DETECTED Final   Serratia marcescens NOT DETECTED NOT DETECTED Final   Carbapenem resistance NOT DETECTED NOT DETECTED Final   Haemophilus influenzae NOT DETECTED NOT DETECTED Final   Neisseria meningitidis NOT DETECTED NOT DETECTED Final   Pseudomonas aeruginosa NOT DETECTED NOT DETECTED Final   Candida albicans NOT DETECTED NOT DETECTED Final   Candida glabrata NOT DETECTED NOT DETECTED Final   Candida krusei NOT DETECTED NOT DETECTED Final   Candida parapsilosis NOT DETECTED NOT DETECTED Final   Candida tropicalis NOT DETECTED NOT DETECTED Final  Culture, sputum-assessment     Status: None   Collection Time: 01/16/16 10:24 PM  Result Value Ref Range Status   Specimen Description EXPECTORATED SPUTUM  Final   Special Requests NONE  Final   Sputum evaluation   Final    THIS SPECIMEN IS ACCEPTABLE. RESPIRATORY CULTURE REPORT TO FOLLOW.   Report Status 01/17/2016 FINAL  Final  Culture, respiratory (NON-Expectorated)     Status: None (  Preliminary result)   Collection Time: 01/16/16 10:24 PM  Result Value Ref Range Status   Specimen Description EXPECTORATED SPUTUM  Final   Special Requests NONE  Final   Gram Stain   Final    RARE SQUAMOUS  EPITHELIAL CELLS PRESENT ABUNDANT WBC PRESENT,BOTH PMN AND MONONUCLEAR FEW GRAM POSITIVE COCCI IN PAIRS IN CLUSTERS    Culture PENDING  Incomplete   Report Status PENDING  Incomplete    Michel Bickers, [facility] [phone] pager   [phone] cell 01/17/2016, 3:09 PM

## 2016-01-17 NOTE — Progress Notes (Signed)
PROGRESS NOTE  [name]  [id number] DOB: 04-Feb-1936 DOA: 01/16/2016 PCP: [name], MD Outpatient Specialists:  Subjective: Feeling okay this morning, still has very significant hemoptysis.   Brief Narrative:  80 year old AAM with past medical history of SSS S/P PPM, not controlled diabetes, history of prostate cancer came in with multifocal pneumonia, hemoptysis and MSSA bacteremia  Assessment & Plan:   Principal Problem:   Staphylococcus aureus bacteremia Active Problems:   Pacemaker   CAP (community acquired pneumonia)   Pneumonia   Staphylococcus aureus bacteremia -Patient presented with multifocal pneumonia likely the cause of the staph aureus bacteremia. -Per PCR it's MSSA, started on cefazolin, ID consulted likely to add rifampin. -Has pacemaker, 2-D echo ordered.  Community acquired pneumonia -Multifocal pneumonia, initially started on Rocephin and vancomycin switched to cefazolin. -Supportive management with bronchodilators, mucolytics, antitussives and oxygen as needed.  Hemoptysis -Significant hemoptysis, patient is on aspirin, Plavix and CT without contrast showed multifocal pneumonia. -D-dimer elevated at 2.3, likely secondary to the pneumonia. -Likely to be PE as he does not have tachycardia or hypoxia.  Hematuria -Reportedly gross hematuria, ultrasound done showed blood clot versus tumor in the bladder. -Likely will need urology to evaluate, hopefully after 1 or 2 days of antibiotics.  SSS status post PPM -Check 2-D echocardiogram to rule out pacemaker leads endocarditis.  CKD stage IV -Creatinine at baseline of 3.48, hold diuretics.   DVT prophylaxis:  Code Status: Full Code Family Communication:  Disposition Plan:  Diet: Diet Heart Room service appropriate?: Yes; Fluid consistency:: Thin  Consultants:   ID  Procedures:   Echo, pending  Antimicrobials:   Cefazolin   Objective: Filed Vitals:   01/16/16 1837  01/16/16 2126 01/17/16 0627 01/17/16 0857  BP: 160/58 158/50 143/54 136/52  Pulse: 72 68 69 70  Temp: 100.1 F (37.8 C) 99.5 F (37.5 C) 100.1 F (37.8 C) 100 F (37.8 C)  TempSrc: Oral Oral Oral Oral  Resp: 18 15 15    Height: 5\' 10"  (1.778 m)     Weight: 65.998 kg (145 lb 8 oz)  65.59 kg (144 lb 9.6 oz)   SpO2: 94% 95% 94%     Intake/Output Summary (Last 24 hours) at 01/17/16 1239 Last data filed at 01/17/16 0907  Gross per 24 hour  Intake    360 ml  Output    550 ml  Net   -190 ml   Filed Weights   01/16/16 1638 01/16/16 1837 01/17/16 0627  Weight: 70.8 kg (156 lb 1.4 oz) 65.998 kg (145 lb 8 oz) 65.59 kg (144 lb 9.6 oz)    Examination: General exam: Appears calm and comfortable  Respiratory system: Clear to auscultation. Respiratory effort normal. Cardiovascular system: S1 & S2 heard, RRR. No JVD, murmurs, rubs, gallops or clicks. No pedal edema. Gastrointestinal system: Abdomen is nondistended, soft and nontender. No organomegaly or masses felt. Normal bowel sounds heard. Central nervous system: Alert and oriented. No focal neurological deficits. Extremities: Symmetric 5 x 5 power. Skin: No rashes, lesions or ulcers Psychiatry: Judgement and insight appear normal. Mood & affect appropriate.   Data Reviewed: I have personally reviewed following labs and imaging studies  CBC:  Recent Labs Lab 01/12/16 1007 01/16/16 1239 01/16/16 1801  WBC 3.3* 13.1*  --   NEUTROABS 1.4* 11.0*  --   HGB 9.3* 9.7*  --   HCT 28.3* 30.4*  --   MCV 101.1* 102.4*  --   PLT 88* 100* 95*   Basic Metabolic Panel:  Recent Labs Lab 01/16/16 1239  NA 139  K 3.5  CL 105  CO2 21*  GLUCOSE 141*  BUN 67*  CREATININE 3.48*  CALCIUM 9.5   GFR: Estimated Creatinine Clearance: 16.2 mL/min (by C-G formula based on Cr of 3.48). Liver Function Tests:  Recent Labs Lab 01/16/16 1239  AST 30  ALT 12*  ALKPHOS 27*  BILITOT 0.7  PROT 7.6  ALBUMIN 4.2   No results for input(s):  LIPASE, AMYLASE in the last 168 hours. No results for input(s): AMMONIA in the last 168 hours. Coagulation Profile:  Recent Labs Lab 01/16/16 1801 01/17/16 0424  INR 1.27 1.28   Cardiac Enzymes: No results for input(s): CKTOTAL, CKMB, CKMBINDEX, TROPONINI in the last 168 hours. BNP (last 3 results) No results for input(s): PROBNP in the last 8760 hours. HbA1C: No results for input(s): HGBA1C in the last 72 hours. CBG: No results for input(s): GLUCAP in the last 168 hours. Lipid Profile: No results for input(s): CHOL, HDL, LDLCALC, TRIG, CHOLHDL, LDLDIRECT in the last 72 hours. Thyroid Function Tests: No results for input(s): TSH, T4TOTAL, FREET4, T3FREE, THYROIDAB in the last 72 hours. Anemia Panel: No results for input(s): VITAMINB12, FOLATE, FERRITIN, TIBC, IRON, RETICCTPCT in the last 72 hours. Urine analysis:    Component Value Date/Time   COLORURINE YELLOW 01/16/2016 1419   APPEARANCEUR CLOUDY* 01/16/2016 1419   LABSPEC 1.015 01/16/2016 1419   LABSPEC 1.010 05/19/2015 1231   PHURINE 5.5 01/16/2016 1419   PHURINE 6.0 05/19/2015 1231   GLUCOSEU NEGATIVE 01/16/2016 1419   GLUCOSEU Negative 05/19/2015 1231   HGBUR LARGE* 01/16/2016 1419   HGBUR Negative 05/19/2015 1231   BILIRUBINUR NEGATIVE 01/16/2016 1419   BILIRUBINUR Negative 05/19/2015 1231   KETONESUR NEGATIVE 01/16/2016 1419   KETONESUR Negative 05/19/2015 1231   PROTEINUR 100* 01/16/2016 1419   PROTEINUR < 30 05/19/2015 1231   UROBILINOGEN 0.2 05/19/2015 1231   UROBILINOGEN 0.2 04/03/2014 1911   NITRITE NEGATIVE 01/16/2016 1419   NITRITE Negative 05/19/2015 1231   LEUKOCYTESUR NEGATIVE 01/16/2016 1419   LEUKOCYTESUR Negative 05/19/2015 1231   Sepsis Labs: @LABRCNTIP (procalcitonin:4,lacticidven:4)  ) Recent Results (from the past 240 hour(s))  Blood culture (routine x 2)     Status: None (Preliminary result)   Collection Time: 01/16/16  1:00 PM  Result Value Ref Range Status   Specimen Description  LEFT ANTECUBITAL  Final   Special Requests BOTTLES DRAWN AEROBIC AND ANAEROBIC 10CC  Final   Culture  Setup Time   Final    GRAM POSITIVE COCCI IN CLUSTERS IN BOTH AEROBIC AND ANAEROBIC BOTTLES CRITICAL RESULT CALLED TO, READ BACK BY AND VERIFIED WITH: J MARKLE 01/17/16 @ 0927 M VESTAL    Culture GRAM POSITIVE COCCI  Final   Report Status PENDING  Incomplete  Blood culture (routine x 2)     Status: None (Preliminary result)   Collection Time: 01/16/16  1:06 PM  Result Value Ref Range Status   Specimen Description BLOOD LEFT HAND  Final   Special Requests AEROBIC BOTTLE ONLY 10CC  Final   Culture  Setup Time   Final    GRAM POSITIVE COCCI IN CLUSTERS AEROBIC BOTTLE ONLY Organism ID to follow CRITICAL RESULT CALLED TO, READ BACK BY AND VERIFIED WITH: J MARKLE 01/17/16 @ 0927 M VESTAL    Culture GRAM POSITIVE COCCI  Final   Report Status PENDING  Incomplete  Blood Culture ID Panel (Reflexed)     Status: Abnormal   Collection Time: 01/16/16  1:06 PM  Result  Value Ref Range Status   Enterococcus species NOT DETECTED NOT DETECTED Final   Vancomycin resistance NOT DETECTED NOT DETECTED Final   Listeria monocytogenes NOT DETECTED NOT DETECTED Final   Staphylococcus species DETECTED (A) NOT DETECTED Final    Comment: CRITICAL RESULT CALLED TO, READ BACK BY AND VERIFIED WITH: J MARKLE 01/17/16 @ 0927 M VESTAL    Staphylococcus aureus DETECTED (A) NOT DETECTED Final    Comment: CRITICAL RESULT CALLED TO, READ BACK BY AND VERIFIED WITH: J MARKLE 01/17/16 @ 0927 M VESTAL    Methicillin resistance NOT DETECTED NOT DETECTED Final   Streptococcus species NOT DETECTED NOT DETECTED Final   Streptococcus agalactiae NOT DETECTED NOT DETECTED Final   Streptococcus pneumoniae NOT DETECTED NOT DETECTED Final   Streptococcus pyogenes NOT DETECTED NOT DETECTED Final   Acinetobacter baumannii NOT DETECTED NOT DETECTED Final   Enterobacteriaceae species NOT DETECTED NOT DETECTED Final   Enterobacter cloacae  complex NOT DETECTED NOT DETECTED Final   Escherichia coli NOT DETECTED NOT DETECTED Final   Klebsiella oxytoca NOT DETECTED NOT DETECTED Final   Klebsiella pneumoniae NOT DETECTED NOT DETECTED Final   Proteus species NOT DETECTED NOT DETECTED Final   Serratia marcescens NOT DETECTED NOT DETECTED Final   Carbapenem resistance NOT DETECTED NOT DETECTED Final   Haemophilus influenzae NOT DETECTED NOT DETECTED Final   Neisseria meningitidis NOT DETECTED NOT DETECTED Final   Pseudomonas aeruginosa NOT DETECTED NOT DETECTED Final   Candida albicans NOT DETECTED NOT DETECTED Final   Candida glabrata NOT DETECTED NOT DETECTED Final   Candida krusei NOT DETECTED NOT DETECTED Final   Candida parapsilosis NOT DETECTED NOT DETECTED Final   Candida tropicalis NOT DETECTED NOT DETECTED Final  Culture, sputum-assessment     Status: None   Collection Time: 01/16/16 10:24 PM  Result Value Ref Range Status   Specimen Description EXPECTORATED SPUTUM  Final   Special Requests NONE  Final   Sputum evaluation   Final    THIS SPECIMEN IS ACCEPTABLE. RESPIRATORY CULTURE REPORT TO FOLLOW.   Report Status 01/17/2016 FINAL  Final  Culture, respiratory (NON-Expectorated)     Status: None (Preliminary result)   Collection Time: 01/16/16 10:24 PM  Result Value Ref Range Status   Specimen Description EXPECTORATED SPUTUM  Final   Special Requests NONE  Final   Gram Stain   Final    RARE SQUAMOUS EPITHELIAL CELLS PRESENT ABUNDANT WBC PRESENT,BOTH PMN AND MONONUCLEAR FEW GRAM POSITIVE COCCI IN PAIRS IN CLUSTERS    Culture PENDING  Incomplete   Report Status PENDING  Incomplete     Invalid input(s): PROCALCITONIN, LACTICACIDVEN   Radiology Studies: Dg Chest 2 View  01/16/2016  CLINICAL DATA:  Chest pain, cough for 2 days EXAM: CHEST  2 VIEW COMPARISON:  CT chest 04/03/2014 FINDINGS: Cardiomediastinal silhouette is unremarkable. Status post median sternotomy. Dual lead cardiac pacemaker with leads in right  atrium and right ventricle. There is streaky airspace opacification in right base infrahilar region and right upper lobe peripheral perihilar. Findings highly suspicious for pneumonia. Probable small right pleural effusion. Left lung is clear. IMPRESSION: There is streaky airspace opacification in right base infrahilar region and right upper lobe peripheral perihilar. Findings highly suspicious for pneumonia. Followup PA and lateral chest X-ray is recommended in 3-4 weeks following trial of antibiotic therapy to ensure resolution and exclude underlying malignancy. Probable small right pleural effusion. Electronically Signed   By: Lahoma Crocker M.D.   On: 01/16/2016 13:58   Ct Chest Wo Contrast  01/16/2016  CLINICAL DATA:  Hemoptysis, cough.  Symptoms since Monday. EXAM: CT CHEST WITHOUT CONTRAST TECHNIQUE: TECHNIQUE Multidetector CT imaging of the chest was performed following the standard protocol without IV contrast. COMPARISON:  Chest radiograph 01/16/2016, CT 04/03/2014 FINDINGS: Cardiovascular: Calcification of the thoracic aorta. Coronary bypass graft. Pacer wires in the RIGHT heart. No pericardial fluid. Mediastinum/Nodes: No axillary or supraclavicular adenopathy. No mediastinal hilar adenopathy. Esophagus normal. Lungs/Pleura: There is consolidative airspace disease in the RIGHT lower lobe with air bronchograms. A similar pattern in the posterior aspect the RIGHT upper lobe. Smaller pattern of consolidation in the LEFT lower lobe. Findings are consistent with multifocal pneumonia. Upper Abdomen: Limited view of the liver, kidneys, pancreas are unremarkable. Normal adrenal glands. Musculoskeletal: No aggressive osseous lesion.  Midline sternotomy. IMPRESSION: 1. Multifocal pneumonia with most dense infection in RIGHT lower lobe. 2. Coronary artery calcification and aortic atherosclerotic calcification. Electronically Signed   By: Suzy Bouchard M.D.   On: 01/16/2016 16:32   US Renal  01/17/2016  CLINICAL  DATA:  80 year old male with microscopic hematuria. Personal history of prostate cancer. Initial encounter. EXAM: RENAL / URINARY TRACT ULTRASOUND COMPLETE COMPARISON:  CT Abdomen and Pelvis 08/30/2015, and earlier. Right upper quadrant ultrasound from today reported separately. FINDINGS: Right Kidney: Length: 12.3 cm. Echogenicity within normal limits. No mass or hydronephrosis visualized. Left Kidney: Length: 12.0 cm. Echogenicity within normal limits. No mass or hydronephrosis visualized. Bladder: 5 cm area which is dependent, complex, and mass like (image 15), however, there is no internal vascularity detected with brief color Doppler interrogation. Elsewhere the urinary bladder appears normal. Prostate measured to be 5.2 x 3.1 x 4.3 cm. IMPRESSION: 1. Abnormal 5 cm complex, mass-like area of dependent echogenicity in the urinary bladder. However, lack of internal vascularity on Doppler suggests this is more likely blood clot rather than a bladder tumor. Further evaluation recommended. 2. Normal for age sonographic appearance of both kidneys. Electronically Signed   By: Genevie Ann M.D.   On: 01/17/2016 08:01   US Abdomen Limited Ruq  01/17/2016  CLINICAL DATA:  80 year old male with abnormal LFTs. Prior cholecystectomy. Pneumonia. Initial encounter. EXAM: US ABDOMEN LIMITED - RIGHT UPPER QUADRANT COMPARISON:  Noncontrast chest CT 01/16/2016. CT Abdomen and Pelvis 08/30/2015 and earlier FINDINGS: Gallbladder: Surgically absent Common bile duct: Diameter: 6 mm, normal Liver: Chronic 2.6 cm left hepatic lobe cyst (image 18) does demonstrate mild wall thickening, however, this lesion is stable on multiple prior CTs back to 2011. Similar 3.1 cm right lobe cyst, also not significantly changed since 2011. Underlying hepatic echotexture is within normal limits. No intrahepatic biliary ductal dilatation is evident. No new liver lesion identified. Other findings: Negative visible right kidney. No free fluid identified.  IMPRESSION: No acute liver or biliary findings. 2-3 cm liver cysts which are mildly thick walled but benign, having not significantly changed since 2011. Electronically Signed   By: Genevie Ann M.D.   On: 01/17/2016 07:56        Scheduled Meds: . amiodarone  100 mg Oral Daily  . atorvastatin  80 mg Oral QHS  . Calcifediol ER  30 mcg Oral QHS  . carvedilol  25 mg Oral BID WC  .  ceFAZolin (ANCEF) IV  2 g Intravenous Q12H  . cloNIDine  0.1 mg Oral BID  . hydrALAZINE  50 mg Oral TID  . isosorbide mononitrate  120 mg Oral Daily  . multivitamin with minerals  1 tablet Oral q morning - 10a  . spironolactone  25 mg Oral Daily  . tamsulosin  0.4 mg Oral QPC supper  . tuberculin  5 Units Intradermal Once   Continuous Infusions: . sodium chloride 10 mL/hr at 01/16/16 1902     LOS: 1 day    Time spent: 35 minutes    Rohail Klees A, MD [facility] Pager [phone]  If 7PM-7AM, please contact night-coverage [email or web address] Password [facility] 01/17/2016, 12:39 PM

## 2016-01-17 NOTE — Progress Notes (Signed)
Pharmacy Antibiotic Note  [name]. is a 80 y.o. male admitted on 01/16/2016 with multifocal pna per chest CT and BCID + MSSA.  Pharmacy has been consulted for Cefazolin dosing. WBC 13,Tm 100.1, Cr 3.5 seems to be BL since 3/17 this year at least, BP stable 130/50 on home BP meds despite noted to be hypotensive at MD office PTA.   Also has pacemaker - may need to add rifampin - will discuss with MD  Plan: Cefazolin 2GM q12h   Height: 5\' 10"  (177.8 cm) Weight: 144 lb 9.6 oz (65.59 kg) IBW/kg (Calculated) : 73  Temp (24hrs), Avg:99.7 F (37.6 C), Min:99 F (37.2 C), Max:100.1 F (37.8 C)   Recent Labs Lab 01/12/16 1007 01/16/16 1239 01/16/16 1245  WBC 3.3* 13.1*  --   CREATININE  --  3.48*  --   LATICACIDVEN  --   --  2.43*    Estimated Creatinine Clearance: 16.2 mL/min (by C-G formula based on Cr of 3.48).    Allergies  Allergen Reactions  . Nsaids Other (See Comments)    GI issue  . Ibuprofen Other (See Comments)    GI Issues  . Ace Inhibitors Cough    Antimicrobials this admission: Azith 7/7 >> 7/8 Ceftriaxone 7/7 >> 7/8 cefazolin 7/8>  Dose adjustments this admission:   Microbiology results: 7/7BCx: MSSA 2/2   Bonnita Nasuti Pharm.D. CPP, BCPS Clinical Pharmacist [phone] 01/17/2016 11:55 AM

## 2016-01-17 NOTE — Progress Notes (Signed)
  PHARMACY - PHYSICIAN COMMUNICATION CRITICAL VALUE ALERT - BLOOD CULTURE IDENTIFICATION (BCID)  Results for orders placed or performed during the hospital encounter of 01/16/16  Blood Culture ID Panel (Reflexed) (Collected: 01/16/2016  1:06 PM)  Result Value Ref Range   Enterococcus species NOT DETECTED NOT DETECTED   Vancomycin resistance NOT DETECTED NOT DETECTED   Listeria monocytogenes NOT DETECTED NOT DETECTED   Staphylococcus species DETECTED (A) NOT DETECTED   Staphylococcus aureus DETECTED (A) NOT DETECTED   Methicillin resistance NOT DETECTED NOT DETECTED   Streptococcus species NOT DETECTED NOT DETECTED   Streptococcus agalactiae NOT DETECTED NOT DETECTED   Streptococcus pneumoniae NOT DETECTED NOT DETECTED   Streptococcus pyogenes NOT DETECTED NOT DETECTED   Acinetobacter baumannii NOT DETECTED NOT DETECTED   Enterobacteriaceae species NOT DETECTED NOT DETECTED   Enterobacter cloacae complex NOT DETECTED NOT DETECTED   Escherichia coli NOT DETECTED NOT DETECTED   Klebsiella oxytoca NOT DETECTED NOT DETECTED   Klebsiella pneumoniae NOT DETECTED NOT DETECTED   Proteus species NOT DETECTED NOT DETECTED   Serratia marcescens NOT DETECTED NOT DETECTED   Carbapenem resistance NOT DETECTED NOT DETECTED   Haemophilus influenzae NOT DETECTED NOT DETECTED   Neisseria meningitidis NOT DETECTED NOT DETECTED   Pseudomonas aeruginosa NOT DETECTED NOT DETECTED   Candida albicans NOT DETECTED NOT DETECTED   Candida glabrata NOT DETECTED NOT DETECTED   Candida krusei NOT DETECTED NOT DETECTED   Candida parapsilosis NOT DETECTED NOT DETECTED   Candida tropicalis NOT DETECTED NOT DETECTED    Name of physician (or Provider) Contacted: Text page to Dr. Hartford Poli  Changes to prescribed antibiotics required: Already on azithromycin and ceftriaxone, since MRSA not detected can change to Cefazolin 2g IV q12 (adjusted for renal dysfunction).  Deboraha Sprang 01/17/2016  9:59 AM

## 2016-01-18 ENCOUNTER — Inpatient Hospital Stay (HOSPITAL_COMMUNITY): Payer: Medicare Other

## 2016-01-18 DIAGNOSIS — R042 Hemoptysis: Secondary | ICD-10-CM

## 2016-01-18 LAB — BASIC METABOLIC PANEL
Anion gap: 12 (ref 5–15)
BUN: 86 mg/dL — AB (ref 6–20)
CHLORIDE: 100 mmol/L — AB (ref 101–111)
CO2: 22 mmol/L (ref 22–32)
CREATININE: 3.84 mg/dL — AB (ref 0.61–1.24)
Calcium: 8.8 mg/dL — ABNORMAL LOW (ref 8.9–10.3)
GFR calc Af Amer: 16 mL/min — ABNORMAL LOW (ref >60)
GFR calc non Af Amer: 14 mL/min — ABNORMAL LOW (ref >60)
GLUCOSE: 222 mg/dL — AB (ref 65–99)
POTASSIUM: 2.9 mmol/L — AB (ref 3.5–5.1)
SODIUM: 134 mmol/L — AB (ref 135–145)

## 2016-01-18 LAB — CBC
HCT: 28.7 % — ABNORMAL LOW (ref 39.0–52.0)
HEMOGLOBIN: 9.2 g/dL — AB (ref 13.0–17.0)
MCH: 32.4 pg (ref 26.0–34.0)
MCHC: 32.1 g/dL (ref 30.0–36.0)
MCV: 101.1 fL — AB (ref 78.0–100.0)
PLATELETS: 108 10*3/uL — AB (ref 150–400)
RBC: 2.84 MIL/uL — AB (ref 4.22–5.81)
RDW: 17.3 % — ABNORMAL HIGH (ref 11.5–15.5)
WBC: 15.3 10*3/uL — ABNORMAL HIGH (ref 4.0–10.5)

## 2016-01-18 LAB — GLUCOSE, CAPILLARY: Glucose-Capillary: 132 mg/dL — ABNORMAL HIGH (ref 65–99)

## 2016-01-18 MED ORDER — POTASSIUM CHLORIDE CRYS ER 20 MEQ PO TBCR
40.0000 meq | EXTENDED_RELEASE_TABLET | Freq: Four times a day (QID) | ORAL | Status: DC
  Administered 2016-01-18: 40 meq via ORAL
  Filled 2016-01-18: qty 2

## 2016-01-18 MED ORDER — POTASSIUM CHLORIDE CRYS ER 20 MEQ PO TBCR
40.0000 meq | EXTENDED_RELEASE_TABLET | Freq: Four times a day (QID) | ORAL | Status: AC
  Administered 2016-01-18: 40 meq via ORAL
  Filled 2016-01-18: qty 2

## 2016-01-18 NOTE — Progress Notes (Signed)
Patient ID: [name] Brow., male   DOB: January 24, 1936, 80 y.o.   MRN: [id number]         [facility]    Date of Admission:  01/16/2016   Total days of antibiotics 3          Principal Problem:   Staphylococcus aureus bacteremia Active Problems:   CAP (community acquired pneumonia)   Pacemaker   Pneumonia   . amiodarone  100 mg Oral Daily  . atorvastatin  80 mg Oral QHS  . Calcifediol ER  30 mcg Oral QHS  . carvedilol  25 mg Oral BID WC  .  ceFAZolin (ANCEF) IV  2 g Intravenous Q12H  . cloNIDine  0.1 mg Oral BID  . hydrALAZINE  50 mg Oral TID  . isosorbide mononitrate  120 mg Oral Daily  . multivitamin with minerals  1 tablet Oral q morning - 10a  . potassium chloride  40 mEq Oral Q6H  . rifampin  600 mg Oral Daily  . tamsulosin  0.4 mg Oral QPC supper  . tuberculin  5 Units Intradermal Once    SUBJECTIVE: He tells me that he is feeling worse. He is still having hemoptysis.  Review of Systems: Review of Systems  Constitutional: Positive for malaise/fatigue. Negative for fever, chills, weight loss and diaphoresis.  Respiratory: Positive for cough, hemoptysis, sputum production and shortness of breath.   Cardiovascular: Negative for chest pain.  Gastrointestinal: Negative for nausea, vomiting, abdominal pain and diarrhea.  Genitourinary: Positive for hematuria. Negative for dysuria.  Musculoskeletal: Negative for back pain and joint pain.  Neurological: Negative for headaches.    Past Medical History  Diagnosis Date  . Hypertension   . Hyperlipidemia   . GERD (gastroesophageal reflux disease)   . DVT (deep venous thrombosis) ([place]) 2011    Right arm  . Atrial fibrillation ([place])   . Gout   . Depressive disorder   . Internal hemorrhoids without mention of complication   . Gastroparesis   . Osteopenia   . Ischemic colitis ([place])   . Peripheral artery disease ([place])   . Chronic diastolic heart failure ([place])   . 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 Renal artery stenosis ([place])   . CKD (chronic kidney disease)   . Vitamin B 12 deficiency   . CAD (coronary artery disease)     s/b CABG 1994, and subsequent stents. Repeat CABG 12/2011,  . Prostate cancer ([place]) 1997    XRT and lupron  . Myelodysplastic syndrome ([place]) 05/22/2013    With low hemoglobin and platelets treated with Procrit  . Angiomyolipoma 2009    On both kidneys noted in 2009  . Sick sinus syndrome ([place])   . Peptic ulcer     S/p partial gastrectomy in 1969  . Arthritis     neck and left wrist  . History of hiatal hernia   . Diabetes mellitus type 2 with peripheral artery disease ([facility])     DIET CONTROLLED  . Pneumonia 01/16/2016    Social History  Substance Use Topics  . Smoking status: Former Smoker -- 20 years    Types: Cigarettes    Quit date: 07/12/1969  . Smokeless tobacco: Never Used  . Alcohol Use: No    Family History  Problem Relation Age of Onset  . Diabetes Mother   . Heart disease Mother     Heart Disease before age 60  . Hyperlipidemia Mother   . Hypertension Mother   .  CVA Mother 86    cause of death  . Cancer Father     stomach/liver  . Hypertension Father     possibly hypertensive  . Cancer Sister     Breast cancer  . Heart disease Daughter     Heart Disease before age 77  . Hypertension Daughter   . Heart attack Daughter   . CAD Brother   . Heart disease Brother   . Cancer Paternal Uncle     colon  . Heart disease Sister   . Diabetes Sister   . Hypertension Sister    Allergies  Allergen Reactions  . Nsaids Other (See Comments)    GI issue  . Ibuprofen Other (See Comments)    GI Issues  . Ace Inhibitors Cough    OBJECTIVE: Filed Vitals:   01/18/16 0145 01/18/16 0711 01/18/16 0807 01/18/16 1203  BP: 133/47 108/49 143/65 115/47  Pulse: 65 61 70 65  Temp: 99.7 F (37.6 C) 100 F (37.8 C) 99.1 F (37.3 C) 99.6 F (37.6 C)  TempSrc: Oral Oral Oral Oral  Resp:  16 16 16   Height:      Weight:  147 lb 3.2 oz  (66.769 kg)    SpO2: 99% 98% 98% 98%   Body mass index is 21.12 kg/(m^2).  Physical Exam  Constitutional:  He is resting quietly in bed. He appears quite weak. His family is at the bedside.  Cardiovascular: Regular rhythm.   Murmur heard. He is tachycardic with a early 1/6 systolic murmur  Pulmonary/Chest: Effort normal. He has no rales.  Right anterior chest pacemaker site appears normal.    Lab Results Lab Results  Component Value Date   WBC 15.3* 01/18/2016   HGB 9.2* 01/18/2016   HCT 28.7* 01/18/2016   MCV 101.1* 01/18/2016   PLT 108* 01/18/2016    Lab Results  Component Value Date   CREATININE 3.84* 01/18/2016   BUN 86* 01/18/2016   NA 134* 01/18/2016   K 2.9* 01/18/2016   CL 100* 01/18/2016   CO2 22 01/18/2016    Lab Results  Component Value Date   ALT 12* 01/16/2016   AST 30 01/16/2016   ALKPHOS 27* 01/16/2016   BILITOT 0.7 01/16/2016     Microbiology: Recent Results (from the past 240 hour(s))  Blood culture (routine x 2)     Status: Abnormal (Preliminary result)   Collection Time: 01/16/16  1:00 PM  Result Value Ref Range Status   Specimen Description LEFT ANTECUBITAL  Final   Special Requests BOTTLES DRAWN AEROBIC AND ANAEROBIC 10CC  Final   Culture  Setup Time   Final    GRAM POSITIVE COCCI IN CLUSTERS IN BOTH AEROBIC AND ANAEROBIC BOTTLES CRITICAL RESULT CALLED TO, READ BACK BY AND VERIFIED WITH: J MARKLE 01/17/16 @ 0927 M VESTAL    Culture STAPHYLOCOCCUS AUREUS (A)  Final   Report Status PENDING  Incomplete  Blood culture (routine x 2)     Status: Abnormal (Preliminary result)   Collection Time: 01/16/16  1:06 PM  Result Value Ref Range Status   Specimen Description BLOOD LEFT HAND  Final   Special Requests AEROBIC BOTTLE ONLY 10CC  Final   Culture  Setup Time   Final    GRAM POSITIVE COCCI IN CLUSTERS AEROBIC BOTTLE ONLY CRITICAL RESULT CALLED TO, READ BACK BY AND VERIFIED WITH: J MARKLE 01/17/16 @ 0927 M VESTAL    Culture STAPHYLOCOCCUS  AUREUS (A)  Final   Report Status PENDING  Incomplete  Blood Culture ID Panel (Reflexed)     Status: Abnormal   Collection Time: 01/16/16  1:06 PM  Result Value Ref Range Status   Enterococcus species NOT DETECTED NOT DETECTED Final   Vancomycin resistance NOT DETECTED NOT DETECTED Final   Listeria monocytogenes NOT DETECTED NOT DETECTED Final   Staphylococcus species DETECTED (A) NOT DETECTED Final    Comment: CRITICAL RESULT CALLED TO, READ BACK BY AND VERIFIED WITH: J MARKLE 01/17/16 @ 0927 M VESTAL    Staphylococcus aureus DETECTED (A) NOT DETECTED Final    Comment: CRITICAL RESULT CALLED TO, READ BACK BY AND VERIFIED WITH: J MARKLE 01/17/16 @ 0927 M VESTAL    Methicillin resistance NOT DETECTED NOT DETECTED Final   Streptococcus species NOT DETECTED NOT DETECTED Final   Streptococcus agalactiae NOT DETECTED NOT DETECTED Final   Streptococcus pneumoniae NOT DETECTED NOT DETECTED Final   Streptococcus pyogenes NOT DETECTED NOT DETECTED Final   Acinetobacter baumannii NOT DETECTED NOT DETECTED Final   Enterobacteriaceae species NOT DETECTED NOT DETECTED Final   Enterobacter cloacae complex NOT DETECTED NOT DETECTED Final   Escherichia coli NOT DETECTED NOT DETECTED Final   Klebsiella oxytoca NOT DETECTED NOT DETECTED Final   Klebsiella pneumoniae NOT DETECTED NOT DETECTED Final   Proteus species NOT DETECTED NOT DETECTED Final   Serratia marcescens NOT DETECTED NOT DETECTED Final   Carbapenem resistance NOT DETECTED NOT DETECTED Final   Haemophilus influenzae NOT DETECTED NOT DETECTED Final   Neisseria meningitidis NOT DETECTED NOT DETECTED Final   Pseudomonas aeruginosa NOT DETECTED NOT DETECTED Final   Candida albicans NOT DETECTED NOT DETECTED Final   Candida glabrata NOT DETECTED NOT DETECTED Final   Candida krusei NOT DETECTED NOT DETECTED Final   Candida parapsilosis NOT DETECTED NOT DETECTED Final   Candida tropicalis NOT DETECTED NOT DETECTED Final  Culture,  sputum-assessment     Status: None   Collection Time: 01/16/16 10:24 PM  Result Value Ref Range Status   Specimen Description EXPECTORATED SPUTUM  Final   Special Requests NONE  Final   Sputum evaluation   Final    THIS SPECIMEN IS ACCEPTABLE. RESPIRATORY CULTURE REPORT TO FOLLOW.   Report Status 01/17/2016 FINAL  Final  Culture, respiratory (NON-Expectorated)     Status: None (Preliminary result)   Collection Time: 01/16/16 10:24 PM  Result Value Ref Range Status   Specimen Description EXPECTORATED SPUTUM  Final   Special Requests NONE  Final   Gram Stain   Final    RARE SQUAMOUS EPITHELIAL CELLS PRESENT ABUNDANT WBC PRESENT,BOTH PMN AND MONONUCLEAR FEW GRAM POSITIVE COCCI IN PAIRS IN CLUSTERS    Culture CULTURE REINCUBATED FOR BETTER GROWTH  Final   Report Status PENDING  Incomplete     ASSESSMENT: [name] has MSSA bacteremia complicated by right lower lobe pneumonia with hemoptysis. Repeat blood cultures are negative at 24 hours. TTE is pending.  PLAN: 1. Continue cefazolin and rifampin 2. Await Results of repeat blood cultures and TTE  Michel Bickers, MD [facility] [place] [phone] pager   [phone] cell 01/18/2016, 1:54 PM

## 2016-01-18 NOTE — Progress Notes (Signed)
PROGRESS NOTE  [name]  [id number] DOB: [birth year] DOA: 01/16/2016 PCP: [name], MD Outpatient Specialists:  Subjective: Reported feeling okay, still has hemoptysis.   Brief Narrative:  80 year old AAM with past medical history of SSS S/P PPM, not controlled diabetes, history of prostate cancer came in with multifocal pneumonia, hemoptysis and MSSA bacteremia  Assessment & Plan:   Principal Problem:   Staphylococcus aureus bacteremia Active Problems:   Pacemaker   CAP (community acquired pneumonia)   Pneumonia   Staphylococcus aureus bacteremia -Patient presented with multifocal pneumonia likely the cause of the staph aureus bacteremia. -Per [facility] it's MSSA, started on cefazolin and rifampin. -Has pacemaker, 2-D echo ordered.  Community acquired pneumonia -Multifocal pneumonia, initially started on Rocephin and vancomycin switched to cefazolin. -Supportive management with bronchodilators, mucolytics, antitussives and oxygen as needed.  Hemoptysis -Significant hemoptysis, patient is on aspirin, Plavix and CT without contrast showed multifocal pneumonia. -D-dimer elevated at 2.3, likely secondary to the pneumonia. -Unlikely to be PE, as he does not have tachycardia or hypoxia.  This picture taken on 01/18/16.  Hematuria -Reportedly gross hematuria, ultrasound done showed blood clot versus tumor in the bladder. -Likely will need urology to evaluate, hopefully after 1 or 2 days of antibiotics.  SSS status post PPM -Check 2-D echocardiogram to rule out pacemaker leads endocarditis.  CKD stage IV -Creatinine at baseline of 3.8  Hypokalemia -Replete with oral supplements.   DVT prophylaxis:  Code Status: Full Code Family Communication:  Disposition Plan:  Diet: Diet Heart Room service appropriate?: Yes; Fluid consistency:: Thin  Consultants:   ID  Procedures:   Echo, pending  Antimicrobials:   Cefazolin   Objective: Filed Vitals:    01/18/16 0145 01/18/16 0711 01/18/16 0807 01/18/16 1203  BP: 133/47 108/49 143/65 115/47  Pulse: 65 61 70 65  Temp: 99.7 F (37.6 C) 100 F (37.8 C) 99.1 F (37.3 C) 99.6 F (37.6 C)  TempSrc: Oral Oral Oral Oral  Resp:  16 16 16   Height:      Weight:  66.769 kg (147 lb 3.2 oz)    SpO2: 99% 98% 98% 98%    Intake/Output Summary (Last 24 hours) at 01/18/16 1311 Last data filed at 01/18/16 1122  Gross per 24 hour  Intake    920 ml  Output    775 ml  Net    145 ml   Filed Weights   01/16/16 1837 01/17/16 0627 01/18/16 0711  Weight: 65.998 kg (145 lb 8 oz) 65.59 kg (144 lb 9.6 oz) 66.769 kg (147 lb 3.2 oz)    Examination: General exam: Appears calm and comfortable  Respiratory system: Clear to auscultation. Respiratory effort normal. Cardiovascular system: S1 & S2 heard, RRR. No JVD, murmurs, rubs, gallops or clicks. No pedal edema. Gastrointestinal system: Abdomen is nondistended, soft and nontender. No organomegaly or masses felt. Normal bowel sounds heard. Central nervous system: Alert and oriented. No focal neurological deficits. Extremities: Symmetric 5 x 5 power. Skin: No rashes, lesions or ulcers Psychiatry: Judgement and insight appear normal. Mood & affect appropriate.   Data Reviewed: I have personally reviewed following labs and imaging studies  CBC:  Recent Labs Lab 01/12/16 1007 01/16/16 1239 01/16/16 1801 01/18/16 0923  WBC 3.3* 13.1*  --  15.3*  NEUTROABS 1.4* 11.0*  --   --   HGB 9.3* 9.7*  --  9.2*  HCT 28.3* 30.4*  --  28.7*  MCV 101.1* 102.4*  --  101.1*  PLT 88* 100* 95* 108*  Basic Metabolic Panel:  Recent Labs Lab 01/16/16 1239 01/18/16 0923  NA 139 134*  K 3.5 2.9*  CL 105 100*  CO2 21* 22  GLUCOSE 141* 222*  BUN 67* 86*  CREATININE 3.48* 3.84*  CALCIUM 9.5 8.8*   GFR: Estimated Creatinine Clearance: 15 mL/min (by C-G formula based on Cr of 3.84). Liver Function Tests:  Recent Labs Lab 01/16/16 1239  AST 30  ALT 12*    ALKPHOS 27*  BILITOT 0.7  PROT 7.6  ALBUMIN 4.2   No results for input(s): LIPASE, AMYLASE in the last 168 hours. No results for input(s): AMMONIA in the last 168 hours. Coagulation Profile:  Recent Labs Lab 01/16/16 1801 01/17/16 0424  INR 1.27 1.28   Cardiac Enzymes: No results for input(s): CKTOTAL, CKMB, CKMBINDEX, TROPONINI in the last 168 hours. BNP (last 3 results) No results for input(s): PROBNP in the last 8760 hours. HbA1C: No results for input(s): HGBA1C in the last 72 hours. CBG:  Recent Labs Lab 01/18/16 0634  GLUCAP 132*   Lipid Profile: No results for input(s): CHOL, HDL, LDLCALC, TRIG, CHOLHDL, LDLDIRECT in the last 72 hours. Thyroid Function Tests: No results for input(s): TSH, T4TOTAL, FREET4, T3FREE, THYROIDAB in the last 72 hours. Anemia Panel: No results for input(s): VITAMINB12, FOLATE, FERRITIN, TIBC, IRON, RETICCTPCT in the last 72 hours. Urine analysis:    Component Value Date/Time   COLORURINE YELLOW 01/16/2016 1419   APPEARANCEUR CLOUDY* 01/16/2016 1419   LABSPEC 1.015 01/16/2016 1419   LABSPEC 1.010 05/19/2015 1231   PHURINE 5.5 01/16/2016 1419   PHURINE 6.0 05/19/2015 1231   GLUCOSEU NEGATIVE 01/16/2016 1419   GLUCOSEU Negative 05/19/2015 1231   HGBUR LARGE* 01/16/2016 1419   HGBUR Negative 05/19/2015 1231   BILIRUBINUR NEGATIVE 01/16/2016 1419   BILIRUBINUR Negative 05/19/2015 1231   KETONESUR NEGATIVE 01/16/2016 1419   KETONESUR Negative 05/19/2015 1231   PROTEINUR 100* 01/16/2016 1419   PROTEINUR < 30 05/19/2015 1231   UROBILINOGEN 0.2 05/19/2015 1231   UROBILINOGEN 0.2 04/03/2014 1911   NITRITE NEGATIVE 01/16/2016 1419   NITRITE Negative 05/19/2015 1231   LEUKOCYTESUR NEGATIVE 01/16/2016 1419   LEUKOCYTESUR Negative 05/19/2015 1231   Sepsis Labs: @LABRCNTIP (procalcitonin:4,lacticidven:4)  ) Recent Results (from the past 240 hour(s))  Blood culture (routine x 2)     Status: Abnormal (Preliminary result)   Collection  Time: 01/16/16  1:00 PM  Result Value Ref Range Status   Specimen Description LEFT ANTECUBITAL  Final   Special Requests BOTTLES DRAWN AEROBIC AND ANAEROBIC 10CC  Final   Culture  Setup Time   Final    GRAM POSITIVE COCCI IN CLUSTERS IN BOTH AEROBIC AND ANAEROBIC BOTTLES CRITICAL RESULT CALLED TO, READ BACK BY AND VERIFIED WITH: J MARKLE 01/17/16 @ 0927 M VESTAL    Culture STAPHYLOCOCCUS AUREUS (A)  Final   Report Status PENDING  Incomplete  Blood culture (routine x 2)     Status: Abnormal (Preliminary result)   Collection Time: 01/16/16  1:06 PM  Result Value Ref Range Status   Specimen Description BLOOD LEFT HAND  Final   Special Requests AEROBIC BOTTLE ONLY 10CC  Final   Culture  Setup Time   Final    GRAM POSITIVE COCCI IN CLUSTERS AEROBIC BOTTLE ONLY CRITICAL RESULT CALLED TO, READ BACK BY AND VERIFIED WITH: J MARKLE 01/17/16 @ 0927 M VESTAL    Culture STAPHYLOCOCCUS AUREUS (A)  Final   Report Status PENDING  Incomplete  Blood Culture ID Panel (Reflexed)  Status: Abnormal   Collection Time: 01/16/16  1:06 PM  Result Value Ref Range Status   Enterococcus species NOT DETECTED NOT DETECTED Final   Vancomycin resistance NOT DETECTED NOT DETECTED Final   Listeria monocytogenes NOT DETECTED NOT DETECTED Final   Staphylococcus species DETECTED (A) NOT DETECTED Final    Comment: CRITICAL RESULT CALLED TO, READ BACK BY AND VERIFIED WITH: J MARKLE 01/17/16 @ 0927 M VESTAL    Staphylococcus aureus DETECTED (A) NOT DETECTED Final    Comment: CRITICAL RESULT CALLED TO, READ BACK BY AND VERIFIED WITH: J MARKLE 01/17/16 @ 0927 M VESTAL    Methicillin resistance NOT DETECTED NOT DETECTED Final   Streptococcus species NOT DETECTED NOT DETECTED Final   Streptococcus agalactiae NOT DETECTED NOT DETECTED Final   Streptococcus pneumoniae NOT DETECTED NOT DETECTED Final   Streptococcus pyogenes NOT DETECTED NOT DETECTED Final   Acinetobacter baumannii NOT DETECTED NOT DETECTED Final    Enterobacteriaceae species NOT DETECTED NOT DETECTED Final   Enterobacter cloacae complex NOT DETECTED NOT DETECTED Final   Escherichia coli NOT DETECTED NOT DETECTED Final   Klebsiella oxytoca NOT DETECTED NOT DETECTED Final   Klebsiella pneumoniae NOT DETECTED NOT DETECTED Final   Proteus species NOT DETECTED NOT DETECTED Final   Serratia marcescens NOT DETECTED NOT DETECTED Final   Carbapenem resistance NOT DETECTED NOT DETECTED Final   Haemophilus influenzae NOT DETECTED NOT DETECTED Final   Neisseria meningitidis NOT DETECTED NOT DETECTED Final   Pseudomonas aeruginosa NOT DETECTED NOT DETECTED Final   Candida albicans NOT DETECTED NOT DETECTED Final   Candida glabrata NOT DETECTED NOT DETECTED Final   Candida krusei NOT DETECTED NOT DETECTED Final   Candida parapsilosis NOT DETECTED NOT DETECTED Final   Candida tropicalis NOT DETECTED NOT DETECTED Final  Culture, sputum-assessment     Status: None   Collection Time: 01/16/16 10:24 PM  Result Value Ref Range Status   Specimen Description EXPECTORATED SPUTUM  Final   Special Requests NONE  Final   Sputum evaluation   Final    THIS SPECIMEN IS ACCEPTABLE. RESPIRATORY CULTURE REPORT TO FOLLOW.   Report Status 01/17/2016 FINAL  Final  Culture, respiratory (NON-Expectorated)     Status: None (Preliminary result)   Collection Time: 01/16/16 10:24 PM  Result Value Ref Range Status   Specimen Description EXPECTORATED SPUTUM  Final   Special Requests NONE  Final   Gram Stain   Final    RARE SQUAMOUS EPITHELIAL CELLS PRESENT ABUNDANT WBC PRESENT,BOTH PMN AND MONONUCLEAR FEW GRAM POSITIVE COCCI IN PAIRS IN CLUSTERS    Culture CULTURE REINCUBATED FOR BETTER GROWTH  Final   Report Status PENDING  Incomplete     Invalid input(s): PROCALCITONIN, LACTICACIDVEN   Radiology Studies: Dg Chest 2 View  01/16/2016  CLINICAL DATA:  Chest pain, cough for 2 days EXAM: CHEST  2 VIEW COMPARISON:  CT chest 04/03/2014 FINDINGS: Cardiomediastinal  silhouette is unremarkable. Status post median sternotomy. Dual lead cardiac pacemaker with leads in right atrium and right ventricle. There is streaky airspace opacification in right base infrahilar region and right upper lobe peripheral perihilar. Findings highly suspicious for pneumonia. Probable small right pleural effusion. Left lung is clear. IMPRESSION: There is streaky airspace opacification in right base infrahilar region and right upper lobe peripheral perihilar. Findings highly suspicious for pneumonia. Followup PA and lateral chest X-ray is recommended in 3-4 weeks following trial of antibiotic therapy to ensure resolution and exclude underlying malignancy. Probable small right pleural effusion. Electronically Signed  By: Lahoma Crocker M.D.   On: 01/16/2016 13:58   Ct Chest Wo Contrast  01/16/2016  CLINICAL DATA:  Hemoptysis, cough.  Symptoms since Monday. EXAM: CT CHEST WITHOUT CONTRAST TECHNIQUE: TECHNIQUE Multidetector CT imaging of the chest was performed following the standard protocol without IV contrast. COMPARISON:  Chest radiograph 01/16/2016, CT 04/03/2014 FINDINGS: Cardiovascular: Calcification of the thoracic aorta. Coronary bypass graft. Pacer wires in the RIGHT heart. No pericardial fluid. Mediastinum/Nodes: No axillary or supraclavicular adenopathy. No mediastinal hilar adenopathy. Esophagus normal. Lungs/Pleura: There is consolidative airspace disease in the RIGHT lower lobe with air bronchograms. A similar pattern in the posterior aspect the RIGHT upper lobe. Smaller pattern of consolidation in the LEFT lower lobe. Findings are consistent with multifocal pneumonia. Upper Abdomen: Limited view of the liver, kidneys, pancreas are unremarkable. Normal adrenal glands. Musculoskeletal: No aggressive osseous lesion.  Midline sternotomy. IMPRESSION: 1. Multifocal pneumonia with most dense infection in RIGHT lower lobe. 2. Coronary artery calcification and aortic atherosclerotic calcification.  Electronically Signed   By: Suzy Bouchard M.D.   On: 01/16/2016 16:32   US Renal  01/17/2016  CLINICAL DATA:  80 year old male with microscopic hematuria. Personal history of prostate cancer. Initial encounter. EXAM: RENAL / URINARY TRACT ULTRASOUND COMPLETE COMPARISON:  CT Abdomen and Pelvis 08/30/2015, and earlier. Right upper quadrant ultrasound from today reported separately. FINDINGS: Right Kidney: Length: 12.3 cm. Echogenicity within normal limits. No mass or hydronephrosis visualized. Left Kidney: Length: 12.0 cm. Echogenicity within normal limits. No mass or hydronephrosis visualized. Bladder: 5 cm area which is dependent, complex, and mass like (image 15), however, there is no internal vascularity detected with brief color Doppler interrogation. Elsewhere the urinary bladder appears normal. Prostate measured to be 5.2 x 3.1 x 4.3 cm. IMPRESSION: 1. Abnormal 5 cm complex, mass-like area of dependent echogenicity in the urinary bladder. However, lack of internal vascularity on Doppler suggests this is more likely blood clot rather than a bladder tumor. Further evaluation recommended. 2. Normal for age sonographic appearance of both kidneys. Electronically Signed   By: Genevie Ann M.D.   On: 01/17/2016 08:01   US Abdomen Limited Ruq  01/17/2016  CLINICAL DATA:  80 year old male with abnormal LFTs. Prior cholecystectomy. Pneumonia. Initial encounter. EXAM: US ABDOMEN LIMITED - RIGHT UPPER QUADRANT COMPARISON:  Noncontrast chest CT 01/16/2016. CT Abdomen and Pelvis 08/30/2015 and earlier FINDINGS: Gallbladder: Surgically absent Common bile duct: Diameter: 6 mm, normal Liver: Chronic 2.6 cm left hepatic lobe cyst (image 18) does demonstrate mild wall thickening, however, this lesion is stable on multiple prior CTs back to 2011. Similar 3.1 cm right lobe cyst, also not significantly changed since 2011. Underlying hepatic echotexture is within normal limits. No intrahepatic biliary ductal dilatation is evident.  No new liver lesion identified. Other findings: Negative visible right kidney. No free fluid identified. IMPRESSION: No acute liver or biliary findings. 2-3 cm liver cysts which are mildly thick walled but benign, having not significantly changed since 2011. Electronically Signed   By: Genevie Ann M.D.   On: 01/17/2016 07:56        Scheduled Meds: . amiodarone  100 mg Oral Daily  . atorvastatin  80 mg Oral QHS  . Calcifediol ER  30 mcg Oral QHS  . carvedilol  25 mg Oral BID WC  .  ceFAZolin (ANCEF) IV  2 g Intravenous Q12H  . cloNIDine  0.1 mg Oral BID  . hydrALAZINE  50 mg Oral TID  . isosorbide mononitrate  120 mg Oral Daily  .  multivitamin with minerals  1 tablet Oral q morning - 10a  . rifampin  600 mg Oral Daily  . tamsulosin  0.4 mg Oral QPC supper  . tuberculin  5 Units Intradermal Once   Continuous Infusions: . sodium chloride 10 mL/hr at 01/16/16 1902     LOS: 2 days    Time spent: 35 minutes    Quadasia Newsham A, MD [facility] Pager [phone]  If 7PM-7AM, please contact night-coverage [email or web address] Password [email or web address] 01/18/2016, 1:11 PM

## 2016-01-18 NOTE — Progress Notes (Addendum)
Pt complains 10/10 pain on L wrist. Pt can move wrist freely but wishes to have splint applied. Instructed pt that we need to assess wrist for cause of pain prior to applying splint. Paged Dr. Hartford Poli regarding pt L wrist pain. 2 view xray ordered. TB skin PPD to R forearm negative, no erythema and 0 mm induration. Dr. [place] Poli aware. Will continue to monitor pt.        Maurene Capes RN

## 2016-01-19 ENCOUNTER — Inpatient Hospital Stay (HOSPITAL_COMMUNITY): Payer: Medicare Other

## 2016-01-19 DIAGNOSIS — R7881 Bacteremia: Secondary | ICD-10-CM

## 2016-01-19 LAB — CBC
HEMATOCRIT: 26.5 % — AB (ref 39.0–52.0)
HEMOGLOBIN: 8.8 g/dL — AB (ref 13.0–17.0)
MCH: 32.5 pg (ref 26.0–34.0)
MCHC: 33.2 g/dL (ref 30.0–36.0)
MCV: 97.8 fL (ref 78.0–100.0)
Platelets: 109 10*3/uL — ABNORMAL LOW (ref 150–400)
RBC: 2.71 MIL/uL — ABNORMAL LOW (ref 4.22–5.81)
RDW: 16.9 % — AB (ref 11.5–15.5)
WBC: 13.3 10*3/uL — ABNORMAL HIGH (ref 4.0–10.5)

## 2016-01-19 LAB — BASIC METABOLIC PANEL
Anion gap: 9 (ref 5–15)
BUN: 84 mg/dL — AB (ref 6–20)
CO2: 22 mmol/L (ref 22–32)
Calcium: 9 mg/dL (ref 8.9–10.3)
Chloride: 103 mmol/L (ref 101–111)
Creatinine, Ser: 3.6 mg/dL — ABNORMAL HIGH (ref 0.61–1.24)
GFR calc Af Amer: 17 mL/min — ABNORMAL LOW (ref >60)
GFR calc non Af Amer: 15 mL/min — ABNORMAL LOW (ref >60)
GLUCOSE: 135 mg/dL — AB (ref 65–99)
POTASSIUM: 3.6 mmol/L (ref 3.5–5.1)
Sodium: 134 mmol/L — ABNORMAL LOW (ref 135–145)

## 2016-01-19 LAB — CULTURE, BLOOD (ROUTINE X 2)

## 2016-01-19 LAB — CULTURE, RESPIRATORY: Culture: NORMAL

## 2016-01-19 LAB — SYNOVIAL CELL COUNT + DIFF, W/ CRYSTALS
CRYSTALS FLUID: NONE SEEN
Lymphocytes-Synovial Fld: 5 % (ref 0–20)
NEUTROPHIL, SYNOVIAL: 95 % — AB (ref 0–25)
WBC, SYNOVIAL: 23415 /mm3 — AB (ref 0–200)

## 2016-01-19 LAB — ECHOCARDIOGRAM COMPLETE
HEIGHTINCHES: 70 in
WEIGHTICAEL: 2355.2 [oz_av]

## 2016-01-19 LAB — CULTURE, RESPIRATORY W GRAM STAIN

## 2016-01-19 MED ORDER — IOPAMIDOL (ISOVUE-M 200) INJECTION 41%
INTRAMUSCULAR | Status: AC
  Filled 2016-01-19: qty 10

## 2016-01-19 MED ORDER — LIDOCAINE HCL (PF) 1 % IJ SOLN
10.0000 mL | Freq: Once | INTRAMUSCULAR | Status: AC
  Administered 2016-01-19: 10 mL via INTRADERMAL
  Administered 2016-01-19: 5 mL via INTRADERMAL

## 2016-01-19 MED ORDER — LIDOCAINE HCL 1 % IJ SOLN
INTRAMUSCULAR | Status: AC
  Administered 2016-01-19: 16:00:00
  Filled 2016-01-19: qty 10

## 2016-01-19 MED ORDER — COLCHICINE 0.6 MG PO TABS
0.6000 mg | ORAL_TABLET | Freq: Every day | ORAL | Status: DC
  Administered 2016-01-19 – 2016-01-22 (×4): 0.6 mg via ORAL
  Filled 2016-01-19 (×5): qty 1

## 2016-01-19 MED ORDER — SODIUM CHLORIDE 0.9 % IJ SOLN
INTRAMUSCULAR | Status: AC
  Administered 2016-01-19: 16:00:00
  Filled 2016-01-19: qty 10

## 2016-01-19 NOTE — Clinical Documentation Improvement (Signed)
  Hospitalist  Based on the clinical indicators below please clarify if any of the following conditions is clinically supported.   Abnormal Laboratory Results:  7/9: BUN 86, Creatinine 3.84; GFR - 16.  On 7/10 BUN 84, Creatinine 3.6 and GFR 17.   Acute renal failure  Acute tubular necrosis  Acute renal cortical necrosis  Acute renal medullary necrosis  Acute on chronic renal failure stage 4  Chronic renal failure stage 4  Other  Clinically Undetermined   Supporting Information: Known to have CKD stage 4    Please exercise your independent, professional judgment when responding. A specific answer is not anticipated or expected.   Thank You,  [place] [phone]

## 2016-01-19 NOTE — Progress Notes (Signed)
Patient ID: [name] Brow., male   DOB: 03-27-1936, 80 y.o.   MRN: [id number]         [facility] for Infectious Disease    Date of Admission:  01/16/2016    Total days of antibiotics 4         He was in the process of having his transthoracic echocardiogram done when I came back today. He has MSSA bacteremia and right lower lobe pneumonia. I note that he has complained of left wrist pain which will need to be evaluated for signs of septic arthritis. Repeat blood cultures are negative at 48 hours. If they remain negative and should be safe to place a PICC tomorrow. I will await results of his echocardiogram. He is at high risk for having pacemaker endocarditis.      Michel Bickers, MD [facility] [phone] pager   [phone] cell 07/15/2015, 1:32 PM

## 2016-01-19 NOTE — Care Management Important Message (Signed)
Important Message  Patient Details  Name: [name] BATTISTA Sr. MRN: [id number] Date of Birth: [birth year]   Medicare Important Message Given:  Yes    Loann Quill 01/19/2016, 8:22 AM

## 2016-01-19 NOTE — Clinical Documentation Improvement (Signed)
Hospitalist  Based on the clinical indicators below please clarify if any of the following conditions is clinically supported.    Sepsis - specify causative organism if known due to pneumonia and staph aureus in blood  Other  Clinically Undetermined   Supporting Information: Sent in by PCP to have work up for sepsis.  Initial BP 70/40, TEMP 100; WBC 13.1; Lactic acid 2.43; blood culture growing staph aureus.     Please exercise your independent, professional judgment when responding. A specific answer is not anticipated or expected.   Thank You,  [place] [phone]

## 2016-01-19 NOTE — Consult Note (Addendum)
ORTHOPAEDIC CONSULTATION HISTORY & PHYSICAL REQUESTING PHYSICIAN: Verlee Monte, MD  Chief Complaint: left wrist pain/swelling  HPI: [name]. is a 80 y.o. male with multiple chronic medical problems, admitted with hemoptysis and subsequently demonstrated to have a right lower lobe infiltrate. Both admission blood cultures have already grown MSSA. He has a history of bradycardia and a permanent pacemaker.  Over the past 36 hours, he has developed progressive pain about the left wrist.  He has a history of gout, and reports episodic very painful episodes in the ankles.  He is not taking colchicine.  Interventional radiology performed fluoroscopically guided aspiration today, no crystals were evident on fluid analysis, fluid WBC 23,000, and Gram stain with white cells with no organisms.  Past Medical History  Diagnosis Date  . Hypertension   . Hyperlipidemia   . GERD (gastroesophageal reflux disease)   . DVT (deep venous thrombosis) ([place]) 2011    Right arm  . Atrial fibrillation ([place])   . Gout   . Depressive disorder   . Internal hemorrhoids without mention of complication   . Gastroparesis   . Osteopenia   . Ischemic colitis ([place])   . Peripheral artery disease ([place])   . Chronic diastolic heart failure ([place])   . 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 Renal artery stenosis ([place])   . CKD (chronic kidney disease)   . Vitamin B 12 deficiency   . CAD (coronary artery disease)     s/b CABG 1994, and subsequent stents. Repeat CABG 12/2011,  . Prostate cancer ([place]) 1997    XRT and lupron  . Myelodysplastic syndrome ([place]) 05/22/2013    With low hemoglobin and platelets treated with Procrit  . Angiomyolipoma 2009    On both kidneys noted in 2009  . Sick sinus syndrome ([place])   . Peptic ulcer     S/p partial gastrectomy in 1969  . Arthritis     neck and left wrist  . History of hiatal hernia   . Diabetes mellitus type 2 with peripheral artery disease ([facility])     DIET CONTROLLED  .  Pneumonia 01/16/2016   Past Surgical History  Procedure Laterality Date  . Hiatal hernia repair      and ulcer repair  . Appendectomy  1991  . Pacemaker insertion  10/18/1994    DDD pacemaker, [employer]. Gen change 12/10/2003.  Marland Kitchen Coronary artery bypass graft  01/22/1993  . Cholecystectomy  Oct 2009    Laparoscopic  . Coronary artery bypass graft  01/03/2012    Procedure: REDO CORONARY ARTERY BYPASS GRAFTING (CABG);  Surgeon: Gaye Pollack, MD;  Location: [place];  Service: Open Heart Surgery;  Laterality: N/A;  Redo CABG x  using bilateral internal mammary arteries;  left leg greater saphenous vein harvested endoscopically  . Celiac artery angioplasty  05-16-12    and stenting  . Partial gastrectomy  1969    Hx of ulcer s/p partial gastrectomy/ has pernicious anemia  . Other surgical history  02/13/13    superior mesenteric artery angiogram  . Thrombectomy / embolectomy subclavian artery  02/02/10    Right subclavian thromboectomy and venous angioplasty, and chronic mesenteric ischemia with Herculink stenting to superior mesenteric and celiac arteries - Dr. Trula Slade  . Other surgical history  05/16/12    Stent in stomach  . Pacemaker generator change  12/10/2003    SJM Identity XL DR performed by Dr Leonia Reeves  . Visceral angiogram N/A 05/25/2011    Procedure: VISCERAL ANGIOGRAM;  Surgeon: Serafina Mitchell, MD;  Location: [facility] CATH LAB;  Service: Cardiovascular;  Laterality: N/A;  . Abdominal angiogram N/A 05/25/2011    Procedure: ABDOMINAL ANGIOGRAM;  Surgeon: Serafina Mitchell, MD;  Location: [facility] CATH LAB;  Service: Cardiovascular;  Laterality: N/A;  . Lower extremity angiogram Bilateral 05/25/2011    Procedure: LOWER EXTREMITY ANGIOGRAM;  Surgeon: Serafina Mitchell, MD;  Location: [facility] CATH LAB;  Service: Cardiovascular;  Laterality: Bilateral;  . Left heart catheterization with coronary/graft angiogram  12/24/2011    Procedure: LEFT HEART CATHETERIZATION WITH Beatrix Fetters;  Surgeon: Sinclair Grooms, MD;  Location: [facility] CATH LAB;  Service: Cardiovascular;;  . Visceral angiogram Bilateral 12/28/2011    Procedure: VISCERAL ANGIOGRAM;  Surgeon: Serafina Mitchell, MD;  Location: [facility] CATH LAB;  Service: Cardiovascular;  Laterality: Bilateral;  . Visceral angiogram N/A 05/16/2012    Procedure: VISCERAL ANGIOGRAM;  Surgeon: Serafina Mitchell, MD;  Location[facility] CATH LAB;  Service: Cardiovascular;  Laterality: N/A;  . Visceral angiogram N/A 02/13/2013    Procedure: VISCERAL ANGIOGRAM;  Surgeon: Serafina Mitchell, MD;  Location: [facility] CATH LAB;  Service: Cardiovascular;  Laterality: N/A;  . Renal angiogram N/A 02/13/2013    Procedure: RENAL ANGIOGRAM;  Surgeon: Serafina Mitchell, MD;  Location: [facility] CATH LAB;  Service: Cardiovascular;  Laterality: N/A;  . Abdominal angiogram N/A 02/13/2013    Procedure: ABDOMINAL ANGIOGRAM;  Surgeon: Serafina Mitchell, MD;  Location: [facility] CATH LAB;  Service: Cardiovascular;  Laterality: N/A;  . Inguinal hernia repair Right 10/28/2015    Procedure: OPEN RIGHT INGUINAL HERNIA REPAIR;  Surgeon: Greer Pickerel, MD;  Location: [facility];  Service: General;  Laterality: Right;  . Insertion of mesh Right 10/28/2015    Procedure: INSERTION OF MESH;  Surgeon: Greer Pickerel, MD;  Location: [facility] ORS;  Service: General;  Laterality: Right;   Social History   Social History  . Marital Status: Married    Spouse Name: N/A  . Number of Children: N/A  . Years of Education: N/A   Social History Main Topics  . Smoking status: Former Smoker -- 20 years    Types: Cigarettes    Quit date: 07/12/1969  . Smokeless tobacco: Never Used  . Alcohol Use: No  . Drug Use: No  . Sexual Activity: Yes   Other Topics Concern  . None   Social History Narrative   Family History  Problem Relation Age of Onset  . Diabetes Mother   . Heart disease Mother     Heart Disease before age 67  . Hyperlipidemia Mother   . Hypertension Mother   . CVA Mother 5    cause of death  . Cancer Father     stomach/liver  .  Hypertension Father     possibly hypertensive  . Cancer Sister     Breast cancer  . Heart disease Daughter     Heart Disease before age 16  . Hypertension Daughter   . Heart attack Daughter   . CAD Brother   . Heart disease Brother   . Cancer Paternal Uncle     colon  . Heart disease Sister   . Diabetes Sister   . Hypertension Sister    Allergies  Allergen Reactions  . Nsaids Other (See Comments)    GI issue  . Ibuprofen Other (See Comments)    GI Issues  . Ace Inhibitors Cough   Prior to Admission medications   Medication Sig Start Date End Date Taking? Authorizing Provider  acetaminophen (ARTHRITIS PAIN RELIEF) 650 MG CR tablet Take 1,300 mg by mouth 3 (three) times daily.    Yes Historical Provider, MD  amiodarone (PACERONE) 200 MG tablet Take one-half tablet by  mouth daily 01/07/16  Yes Belva Crome, MD  aspirin 81 MG tablet Take 1 tablet (81 mg total) by mouth daily. 09/10/14  Yes Belva Crome, MD  atorvastatin (LIPITOR) 80 MG tablet Take 80 mg by mouth at bedtime.    Yes Historical Provider, MD  Calcifediol ER (RAYALDEE) 30 MCG CPCR Take 30 mcg by mouth at bedtime.   Yes Historical Provider, MD  CALCIUM-VITAMIN D PO Take 1 capsule by mouth 2 (two) times daily.   Yes Historical Provider, MD  carvedilol (COREG) 25 MG tablet TAKE 1 TABLET BY MOUTH  TWICE DAILY WITH MEALS 01/07/16  Yes Belva Crome, MD  cloNIDine (CATAPRES) 0.1 MG tablet Take 0.1 mg by mouth 2 (two) times daily. 02/19/15  Yes Historical Provider, MD  clopidogrel (PLAVIX) 75 MG tablet Take 1 tablet by mouth  daily 01/07/16  Yes Belva Crome, MD  cyanocobalamin (,VITAMIN B-12,) 1000 MCG/ML injection Inject 1,000 mcg into the muscle every 30 (thirty) days. Vitamin B12 - last injection 09/01/15   Yes Historical Provider, MD  epoetin alfa (EPOGEN,PROCRIT) 16109 UNIT/ML injection Inject 10,000 Units into the skin every 14 (fourteen) days. Done at [facility] - last injection 09/22/15   Yes Historical Provider, MD    furosemide (LASIX) 80 MG tablet Take 80 mg by mouth 2 (two) times daily. Pt takes 1 and 1/2 tablet daily. (140 mg) total   Yes Historical Provider, MD  hydrALAZINE (APRESOLINE) 50 MG tablet Take 1 tablet (50 mg total) by mouth 3 (three) times daily. 11/23/13  Yes Belva Crome, MD  isosorbide mononitrate (IMDUR) 120 MG 24 hr tablet Take 1 tablet by mouth  daily 01/07/16  Yes Belva Crome, MD  Multiple Vitamin (MULTIVITAMIN WITH MINERALS) TABS tablet Take 1 tablet by mouth every morning.   Yes Historical Provider, MD  pantoprazole (PROTONIX) 40 MG tablet Take 1 tablet by mouth daily. 01/14/16  Yes Historical Provider, MD  potassium chloride SA (K-DUR,KLOR-CON) 20 MEQ tablet Take 20-40 mEq by mouth 3 (three) times daily. Take 2 tablets (40 meq) by mouth with breakfast, take 1 tablet (20 meq) with lunch and supper 06/21/13  Yes Belva Crome, MD  [place]   Yes Historical Provider, MD  silodosin (RAPAFLO) 8 MG CAPS capsule Take 8 mg by mouth at bedtime.    Yes Historical Provider, MD  spironolactone (ALDACTONE) 25 MG tablet Take 25 mg by mouth daily.   Yes Historical Provider, MD  leuprolide (LUPRON) 30 MG injection Inject 30 mg into the muscle every 4 (four) months.    Historical Provider, MD   Dg Wrist 2 Views Left  01/18/2016  CLINICAL DATA:  Acute onset of left wrist pain.  Initial encounter. EXAM: LEFT WRIST - 2 VIEW COMPARISON:  None. FINDINGS: There is no evidence of fracture or dislocation. There is joint space narrowing along the radiocarpal joint, and at the radial aspect of the carpal rows. This resolves in mild chronic deformity of the scaphoid and lunate, with expansion of the scapholunate distance to 5 mm. Scattered vascular calcifications are seen. No additional soft tissue abnormalities are characterized on radiograph. IMPRESSION: 1. No evidence of fracture or dislocation. 2. Degenerative joint space narrowing along the radiocarpal joints, and at the  radial aspect of  the carpal rows. This results in mild chronic deformity of the scaphoid and lunate, with expansion of the scapholunate distance to 5 mm, reflecting scapholunate dissociation. Electronically Signed   By: Garald Balding M.D.   On: 01/18/2016 16:57    Positive ROS: All other systems have been reviewed and were otherwise negative with the exception of those mentioned in the HPI and as above.  Physical Exam: Vitals: Refer to EMR. Constitutional:  WD, WN, NAD HEENT:  NCAT, EOMI Neuro/Psych:  Alert & oriented to person, place, and time; appropriate mood & affect Lymphatic: No generalized extremity edema or lymphadenopathy Extremities / MSK:  The extremities are normal with respect to appearance, ranges of motion, joint stability, muscle strength/tone, sensation, & perfusion except as otherwise noted:  The left hand and digits are not particularly swollen.  There is swelling that is generalized about the wrist, extending proximally and distally.  He has tenderness even with palpation of the soft tissues in the midforearm as well as the dorsum of the hand distal to the wrist.  He has pain with active and passive movement of the wrist, but also with stretching of the extrinsic extensor muscles/tendons.  He has pain to much lesser extent with forearm rotation.  There is a taped 4 x 4 on the dorsum of the wrist from the previous aspiration.  Assessment: Left upper extremity pain with swelling-differential includes septic versus nonseptic inflammation (no crystals, WBC 23K, no organisms on Gm Stain)  Plan: I discussed this with him and his family.  For now, we will reinstitute a diet for now, follow him clinically, and follow the cultures.  If the cultures grow an organism, we will likely proceed with wrist arthrotomy, irrigation and drainage.  I suspect that if the cultures were to become positive, it would take until Wednesday for this to occur, allowing surgery later Wednesday  afternoon/evening if necessary. In the interim, he is provided a wrist splint for comfort, instructions to elevate the digits, working on range of motion, and if not medically contraindicated, colchicine may be helpful, but I will defer that to his primary service.  Rayvon Char [address], [place], [place]  [address] Office: [phone] Mobile: [phone]  01/19/2016, 6:41 PM

## 2016-01-19 NOTE — Progress Notes (Signed)
Orthopedic Tech Progress Note Patient Details:  [name] 04/02/1936 [id number]  Ortho Devices Type of Ortho Device: Velcro wrist splint Ortho Device/Splint Interventions: Application   [place] 01/19/2016, 7:22 PM

## 2016-01-19 NOTE — Progress Notes (Signed)
PROGRESS NOTE  [name]  [id number] DOB: 04/13/1936 DOA: 01/16/2016 PCP: [name], MD Outpatient Specialists:  Subjective: Cough improved as well as hemoptysis, he is having significant left wrist pain this morning.  Brief Narrative:  80 year old AAM with past medical history of SSS S/P PPM, not controlled diabetes, history of prostate cancer came in with multifocal pneumonia, hemoptysis and MSSA bacteremia  Assessment & Plan:   Principal Problem:   Staphylococcus aureus bacteremia Active Problems:   Pacemaker   CAP (community acquired pneumonia)   Pneumonia   Staphylococcus aureus bacteremia -Patient presented with multifocal pneumonia likely the cause of the staph aureus bacteremia. -Per [facility] it's MSSA, started on cefazolin and rifampin. -Has pacemaker, 2-D echo ordered.  Community acquired pneumonia -Multifocal pneumonia, initially started on Rocephin and vancomycin switched to cefazolin. -Supportive management with bronchodilators, mucolytics, antitussives and oxygen as needed.  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 Megan Salon. -TEE could be done but doubt it's going to change management as there is no mention of big abscess by TTE. -? Need removal of the pacemaker, Dr. Megan Salon please advise.  Left wrist pain -Has swelling, warmth and tenderness, in the background of MSSA bacteremia we have to rule out septic arthritis. -X-rays yesterday were negative, per him and nursing staff, the swelling worsened since last night. Hand surgery to evaluate.  Hemoptysis -Significant hemoptysis, patient is on aspirin, Plavix and CT without contrast showed multifocal pneumonia. -D-dimer elevated at 2.3, likely secondary to the pneumonia. -Unlikely to be PE, as he does not have tachycardia or  hypoxia.  Hematuria -Reportedly gross hematuria, ultrasound done showed blood clot versus tumor in the bladder. -Likely will need urology to evaluate, hopefully after 1 or 2 days of antibiotics.  SSS status post PPM -2-D echocardiogram showed 87 mm mobile mass in the right atrium attached to the pacer wire. -This is likely represent endocarditis.  CKD stage IV -Creatinine at baseline of 3.8  Hypokalemia -Replete with oral supplements.   DVT prophylaxis:  Code Status: Full Code Family Communication:  Disposition Plan:  Diet: Diet Heart Room service appropriate?: Yes; Fluid consistency:: Thin  Consultants:   ID  Procedures:   Echo, pending  Antimicrobials:   Cefazolin   Objective: Filed Vitals:   01/18/16 1203 01/18/16 1655 01/18/16 2044 01/19/16 1147  BP: 115/47 130/54 144/51 146/59  Pulse: 65 68 70 73  Temp: 99.6 F (37.6 C) 100.6 F (38.1 C) 99.3 F (37.4 C) 99.6 F (37.6 C)  TempSrc: Oral Oral Oral Oral  Resp: 16 16 16 16   Height:      Weight:      SpO2: 98%  99% 99%    Intake/Output Summary (Last 24 hours) at 01/19/16 1331 Last data filed at 01/19/16 1147  Gross per 24 hour  Intake    840 ml  Output    876 ml  Net    -36 ml   Filed Weights   01/16/16 1837 01/17/16 0627 01/18/16 0711  Weight: 65.998 kg (145 lb 8 oz) 65.59 kg (144 lb 9.6 oz) 66.769 kg (147 lb 3.2 oz)    Examination: General exam: Appears calm and comfortable  Respiratory system: Clear to auscultation. Respiratory effort normal. Cardiovascular system: S1 & S2 heard, RRR. No JVD, murmurs, rubs, gallops or clicks. No pedal edema. Gastrointestinal system: Abdomen is nondistended, soft and nontender. No organomegaly or  masses felt. Normal bowel sounds heard. Central nervous system: Alert and oriented. No focal neurological deficits. Extremities: Symmetric 5 x 5 power. Skin: No rashes, lesions or ulcers Psychiatry: Judgement and insight appear normal. Mood & affect appropriate.    Data Reviewed: I have personally reviewed following labs and imaging studies  CBC:  Recent Labs Lab 01/16/16 1239 01/16/16 1801 01/18/16 0923 01/19/16 0359  WBC 13.1*  --  15.3* 13.3*  NEUTROABS 11.0*  --   --   --   HGB 9.7*  --  9.2* 8.8*  HCT 30.4*  --  28.7* 26.5*  MCV 102.4*  --  101.1* 97.8  PLT 100* 95* 108* 0000000*   Basic Metabolic Panel:  Recent Labs Lab 01/16/16 1239 01/18/16 0923 01/19/16 0359  NA 139 134* 134*  K 3.5 2.9* 3.6  CL 105 100* 103  CO2 21* 22 22  GLUCOSE 141* 222* 135*  BUN 67* 86* 84*  CREATININE 3.48* 3.84* 3.60*  CALCIUM 9.5 8.8* 9.0   GFR: Estimated Creatinine Clearance: 16 mL/min (by C-G formula based on Cr of 3.6). Liver Function Tests:  Recent Labs Lab 01/16/16 1239  AST 30  ALT 12*  ALKPHOS 27*  BILITOT 0.7  PROT 7.6  ALBUMIN 4.2   No results for input(s): LIPASE, AMYLASE in the last 168 hours. No results for input(s): AMMONIA in the last 168 hours. Coagulation Profile:  Recent Labs Lab 01/16/16 1801 01/17/16 0424  INR 1.27 1.28   Cardiac Enzymes: No results for input(s): CKTOTAL, CKMB, CKMBINDEX, TROPONINI in the last 168 hours. BNP (last 3 results) No results for input(s): PROBNP in the last 8760 hours. HbA1C: No results for input(s): HGBA1C in the last 72 hours. CBG:  Recent Labs Lab 01/18/16 0634  GLUCAP 132*   Lipid Profile: No results for input(s): CHOL, HDL, LDLCALC, TRIG, CHOLHDL, LDLDIRECT in the last 72 hours. Thyroid Function Tests: No results for input(s): TSH, T4TOTAL, FREET4, T3FREE, THYROIDAB in the last 72 hours. Anemia Panel: No results for input(s): VITAMINB12, FOLATE, FERRITIN, TIBC, IRON, RETICCTPCT in the last 72 hours. Urine analysis:    Component Value Date/Time   COLORURINE YELLOW 01/16/2016 1419   APPEARANCEUR CLOUDY* 01/16/2016 1419   LABSPEC 1.015 01/16/2016 1419   LABSPEC 1.010 05/19/2015 1231   PHURINE 5.5 01/16/2016 1419   PHURINE 6.0 05/19/2015 1231   GLUCOSEU NEGATIVE  01/16/2016 1419   GLUCOSEU Negative 05/19/2015 1231   HGBUR LARGE* 01/16/2016 1419   HGBUR Negative 05/19/2015 1231   BILIRUBINUR NEGATIVE 01/16/2016 1419   BILIRUBINUR Negative 05/19/2015 1231   KETONESUR NEGATIVE 01/16/2016 1419   KETONESUR Negative 05/19/2015 1231   PROTEINUR 100* 01/16/2016 1419   PROTEINUR < 30 05/19/2015 1231   UROBILINOGEN 0.2 05/19/2015 1231   UROBILINOGEN 0.2 04/03/2014 1911   NITRITE NEGATIVE 01/16/2016 1419   NITRITE Negative 05/19/2015 1231   LEUKOCYTESUR NEGATIVE 01/16/2016 1419   LEUKOCYTESUR Negative 05/19/2015 1231   Sepsis Labs:  Recent Results (from the past 240 hour(s))  Blood culture (routine x 2)     Status: Abnormal   Collection Time: 01/16/16  1:00 PM  Result Value Ref Range Status   Specimen Description LEFT ANTECUBITAL  Final   Special Requests BOTTLES DRAWN AEROBIC AND ANAEROBIC 10CC  Final   Culture  Setup Time   Final    GRAM POSITIVE COCCI IN CLUSTERS IN BOTH AEROBIC AND ANAEROBIC BOTTLES CRITICAL RESULT CALLED TO, READ BACK BY AND VERIFIED WITH: J MARKLE 01/17/16 @ 0927 M VESTAL    Culture (  A)  Final    STAPHYLOCOCCUS AUREUS SUSCEPTIBILITIES PERFORMED ON PREVIOUS CULTURE WITHIN THE LAST 5 DAYS.    Report Status 01/19/2016 FINAL  Final  Blood culture (routine x 2)     Status: Abnormal   Collection Time: 01/16/16  1:06 PM  Result Value Ref Range Status   Specimen Description BLOOD LEFT HAND  Final   Special Requests AEROBIC BOTTLE ONLY 10CC  Final   Culture  Setup Time   Final    GRAM POSITIVE COCCI IN CLUSTERS AEROBIC BOTTLE ONLY CRITICAL RESULT CALLED TO, READ BACK BY AND VERIFIED WITH: J MARKLE 01/17/16 @ 0927 M VESTAL    Culture STAPHYLOCOCCUS AUREUS (A)  Final   Report Status 01/19/2016 FINAL  Final   Organism ID, Bacteria STAPHYLOCOCCUS AUREUS  Final      Susceptibility   Staphylococcus aureus - MIC*    CIPROFLOXACIN >=8 RESISTANT Resistant     ERYTHROMYCIN <=0.25 SENSITIVE Sensitive     GENTAMICIN <=0.5 SENSITIVE  Sensitive     OXACILLIN 0.5 SENSITIVE Sensitive     TETRACYCLINE <=1 SENSITIVE Sensitive     VANCOMYCIN <=0.5 SENSITIVE Sensitive     TRIMETH/SULFA <=10 SENSITIVE Sensitive     CLINDAMYCIN <=0.25 SENSITIVE Sensitive     RIFAMPIN <=0.5 SENSITIVE Sensitive     Inducible Clindamycin NEGATIVE Sensitive     * STAPHYLOCOCCUS AUREUS  Blood Culture ID Panel (Reflexed)     Status: Abnormal   Collection Time: 01/16/16  1:06 PM  Result Value Ref Range Status   Enterococcus species NOT DETECTED NOT DETECTED Final   Vancomycin resistance NOT DETECTED NOT DETECTED Final   Listeria monocytogenes NOT DETECTED NOT DETECTED Final   Staphylococcus species DETECTED (A) NOT DETECTED Final    Comment: CRITICAL RESULT CALLED TO, READ BACK BY AND VERIFIED WITH: J MARKLE 01/17/16 @ 0927 M VESTAL    Staphylococcus aureus DETECTED (A) NOT DETECTED Final    Comment: CRITICAL RESULT CALLED TO, READ BACK BY AND VERIFIED WITH: J MARKLE 01/17/16 @ 0927 M VESTAL    Methicillin resistance NOT DETECTED NOT DETECTED Final   Streptococcus species NOT DETECTED NOT DETECTED Final   Streptococcus agalactiae NOT DETECTED NOT DETECTED Final   Streptococcus pneumoniae NOT DETECTED NOT DETECTED Final   Streptococcus pyogenes NOT DETECTED NOT DETECTED Final   Acinetobacter baumannii NOT DETECTED NOT DETECTED Final   Enterobacteriaceae species NOT DETECTED NOT DETECTED Final   Enterobacter cloacae complex NOT DETECTED NOT DETECTED Final   Escherichia coli NOT DETECTED NOT DETECTED Final   Klebsiella oxytoca NOT DETECTED NOT DETECTED Final   Klebsiella pneumoniae NOT DETECTED NOT DETECTED Final   Proteus species NOT DETECTED NOT DETECTED Final   Serratia marcescens NOT DETECTED NOT DETECTED Final   Carbapenem resistance NOT DETECTED NOT DETECTED Final   Haemophilus influenzae NOT DETECTED NOT DETECTED Final   Neisseria meningitidis NOT DETECTED NOT DETECTED Final   Pseudomonas aeruginosa NOT DETECTED NOT DETECTED Final    Candida albicans NOT DETECTED NOT DETECTED Final   Candida glabrata NOT DETECTED NOT DETECTED Final   Candida krusei NOT DETECTED NOT DETECTED Final   Candida parapsilosis NOT DETECTED NOT DETECTED Final   Candida tropicalis NOT DETECTED NOT DETECTED Final  Culture, sputum-assessment     Status: None   Collection Time: 01/16/16 10:24 PM  Result Value Ref Range Status   Specimen Description EXPECTORATED SPUTUM  Final   Special Requests NONE  Final   Sputum evaluation   Final    THIS SPECIMEN IS ACCEPTABLE.  RESPIRATORY CULTURE REPORT TO FOLLOW.   Report Status 01/17/2016 FINAL  Final  Culture, respiratory (NON-Expectorated)     Status: None   Collection Time: 01/16/16 10:24 PM  Result Value Ref Range Status   Specimen Description EXPECTORATED SPUTUM  Final   Special Requests NONE  Final   Gram Stain   Final    RARE SQUAMOUS EPITHELIAL CELLS PRESENT ABUNDANT WBC PRESENT,BOTH PMN AND MONONUCLEAR FEW GRAM POSITIVE COCCI IN PAIRS IN CLUSTERS    Culture Consistent with normal respiratory flora.  Final   Report Status 01/19/2016 FINAL  Final  Culture, blood (routine x 2)     Status: None (Preliminary result)   Collection Time: 01/17/16  9:50 AM  Result Value Ref Range Status   Specimen Description BLOOD BLOOD LEFT ARM  Final   Special Requests BOTTLES DRAWN AEROBIC ONLY 5CC  Final   Culture NO GROWTH 2 DAYS  Final   Report Status PENDING  Incomplete  Culture, blood (routine x 2)     Status: None (Preliminary result)   Collection Time: 01/17/16  9:50 AM  Result Value Ref Range Status   Specimen Description BLOOD LEFT ANTECUBITAL  Final   Special Requests BOTTLES DRAWN AEROBIC ONLY 5CC  Final   Culture NO GROWTH 2 DAYS  Final   Report Status PENDING  Incomplete     Invalid input(s): PROCALCITONIN, LACTICACIDVEN   Radiology Studies: Dg Wrist 2 Views Left  01/18/2016  CLINICAL DATA:  Acute onset of left wrist pain.  Initial encounter. EXAM: LEFT WRIST - 2 VIEW COMPARISON:  None.  FINDINGS: There is no evidence of fracture or dislocation. There is joint space narrowing along the radiocarpal joint, and at the radial aspect of the carpal rows. This resolves in mild chronic deformity of the scaphoid and lunate, with expansion of the scapholunate distance to 5 mm. Scattered vascular calcifications are seen. No additional soft tissue abnormalities are characterized on radiograph. IMPRESSION: 1. No evidence of fracture or dislocation. 2. Degenerative joint space narrowing along the radiocarpal joints, and at the radial aspect of the carpal rows. This results in mild chronic deformity of the scaphoid and lunate, with expansion of the scapholunate distance to 5 mm, reflecting scapholunate dissociation. Electronically Signed   By: Garald Balding M.D.   On: 01/18/2016 16:57        Scheduled Meds: . amiodarone  100 mg Oral Daily  . atorvastatin  80 mg Oral QHS  . Calcifediol ER  30 mcg Oral QHS  . carvedilol  25 mg Oral BID WC  .  ceFAZolin (ANCEF) IV  2 g Intravenous Q12H  . cloNIDine  0.1 mg Oral BID  . hydrALAZINE  50 mg Oral TID  . isosorbide mononitrate  120 mg Oral Daily  . multivitamin with minerals  1 tablet Oral q morning - 10a  . rifampin  600 mg Oral Daily  . tamsulosin  0.4 mg Oral QPC supper   Continuous Infusions: . sodium chloride 10 mL/hr at 01/16/16 1902     LOS: 3 days    Time spent: 35 minutes    Shary Lamos A, MD [facility] Pager [phone]  If 7PM-7AM, please contact night-coverage [email or web address] Password [email or web address] 01/19/2016, 1:31 PM

## 2016-01-19 NOTE — Progress Notes (Signed)
  Echocardiogram 2D Echocardiogram has been performed.  [name] 01/19/2016, 11:51 AM

## 2016-01-20 DIAGNOSIS — T827XXA Infection and inflammatory reaction due to other cardiac and vascular devices, implants and grafts, initial encounter: Principal | ICD-10-CM

## 2016-01-20 DIAGNOSIS — M00831 Arthritis due to other bacteria, right wrist: Secondary | ICD-10-CM

## 2016-01-20 DIAGNOSIS — R52 Pain, unspecified: Secondary | ICD-10-CM | POA: Diagnosis present

## 2016-01-20 DIAGNOSIS — B9561 Methicillin susceptible Staphylococcus aureus infection as the cause of diseases classified elsewhere: Secondary | ICD-10-CM

## 2016-01-20 DIAGNOSIS — M7989 Other specified soft tissue disorders: Secondary | ICD-10-CM | POA: Diagnosis present

## 2016-01-20 DIAGNOSIS — R042 Hemoptysis: Secondary | ICD-10-CM | POA: Diagnosis present

## 2016-01-20 DIAGNOSIS — I330 Acute and subacute infective endocarditis: Secondary | ICD-10-CM

## 2016-01-20 DIAGNOSIS — B9689 Other specified bacterial agents as the cause of diseases classified elsewhere: Secondary | ICD-10-CM

## 2016-01-20 DIAGNOSIS — J189 Pneumonia, unspecified organism: Secondary | ICD-10-CM

## 2016-01-20 DIAGNOSIS — Y712 Prosthetic and other implants, materials and accessory cardiovascular devices associated with adverse incidents: Secondary | ICD-10-CM

## 2016-01-20 LAB — BASIC METABOLIC PANEL
ANION GAP: 12 (ref 5–15)
BUN: 73 mg/dL — ABNORMAL HIGH (ref 6–20)
CALCIUM: 9.2 mg/dL (ref 8.9–10.3)
CO2: 23 mmol/L (ref 22–32)
CREATININE: 3.02 mg/dL — AB (ref 0.61–1.24)
Chloride: 100 mmol/L — ABNORMAL LOW (ref 101–111)
GFR, EST AFRICAN AMERICAN: 21 mL/min — AB (ref >60)
GFR, EST NON AFRICAN AMERICAN: 18 mL/min — AB (ref >60)
Glucose, Bld: 117 mg/dL — ABNORMAL HIGH (ref 65–99)
Potassium: 3.2 mmol/L — ABNORMAL LOW (ref 3.5–5.1)
SODIUM: 135 mmol/L (ref 135–145)

## 2016-01-20 MED ORDER — POTASSIUM CHLORIDE CRYS ER 20 MEQ PO TBCR
40.0000 meq | EXTENDED_RELEASE_TABLET | Freq: Four times a day (QID) | ORAL | Status: AC
  Administered 2016-01-20 (×2): 40 meq via ORAL
  Filled 2016-01-20 (×2): qty 2

## 2016-01-20 NOTE — Progress Notes (Signed)
PROGRESS NOTE  Rushi Zingg  [id number] DOB: 02/17/1936 DOA: 01/16/2016 PCP: [name], MD Outpatient Specialists:  Subjective: Feels better, wrist pain is improving since yesterday. Less hemoptysis. Reported now a new pain in his right ankle.  Brief Narrative:  80 year old AAM with past medical history of SSS S/P PPM, not controlled diabetes, history of prostate cancer came in with multifocal pneumonia, hemoptysis and MSSA bacteremia  Assessment & Plan:   Principal Problem:   Staphylococcus aureus bacteremia Active Problems:   PAF (paroxysmal atrial fibrillation) (HCC)   CKD (chronic kidney disease), stage IV (HCC)   Pacemaker   CAP (community acquired pneumonia)   Pneumonia   Staphylococcus aureus bacteremia -Patient presented with multifocal pneumonia likely the cause of the staph aureus bacteremia. -Per [facility] it's MSSA, started on cefazolin and rifampin. -2-D echocardiogram showed 78 mobile mass at the pacemaker wire.  Community acquired pneumonia -Multifocal pneumonia, initially started on Rocephin and vancomycin switched to cefazolin. -Supportive management with bronchodilators, mucolytics, antitussives and oxygen as needed.  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 Megan Salon. -TEE could be done, but doubt it's going to change management as there is no mention of big abscess by TTE. -Cardiology consulted for further recommendation,? Pacemaker removal, Dr. Lovena Le evaluated the a.m I'll keep him NPO .  Left wrist pain -Has swelling, warmth and tenderness, in the background of MSSA bacteremia we have to rule out septic arthritis. -Evaluated by hand surgery, recommended to continue antibiotics started colchicine. -This is could be acute gout attack versus septic arthritis, but more likely acute  gout, gram stain is negative. -Patient started on colchicine. He also had his right ankle now hurting.  Hemoptysis -Significant hemoptysis, patient is on aspirin, Plavix and CT without contrast showed multifocal pneumonia. -D-dimer elevated at 2.3, likely secondary to the pneumonia. -Unlikely to be PE, as he does not have tachycardia or hypoxia.  Hematuria -Reportedly gross hematuria, ultrasound done showed blood clot versus tumor in the bladder. -Likely will need urology to evaluate, hopefully after 1 or 2 days of antibiotics.  SSS status post PPM -2-D echocardiogram showed 87 mm mobile mass in the right atrium attached to the pacer wire. -This is likely represent endocarditis. Cardiology to evaluate.  CKD stage IV -Creatinine at baseline of 3.8  Hypokalemia -Replete with oral supplements.   DVT prophylaxis:  Code Status: Full Code Family Communication:  Disposition Plan:  Diet: Diet heart healthy/carb modified Room service appropriate?: Yes; Fluid consistency:: Thin  Consultants:   ID  Procedures:   Echo, pending  Antimicrobials:   Cefazolin   Objective: Filed Vitals:   01/19/16 1147 01/19/16 2055 01/20/16 0455 01/20/16 1000  BP: 146/59 180/66 149/62 164/69  Pulse: 73 79 76   Temp: 99.6 F (37.6 C) 100 F (37.8 C) 99.6 F (37.6 C)   TempSrc: Oral Oral Oral   Resp: 16 16 16    Height:      Weight:   65.635 kg (144 lb 11.2 oz)   SpO2: 99% 97% 97%     Intake/Output Summary (Last 24 hours) at 01/20/16 1532 Last data filed at 01/20/16 1345  Gross per 24 hour  Intake    480 ml  Output   1425 ml  Net   -945 ml   Filed Weights   01/17/16 0627 01/18/16 0711 01/20/16 0455  Weight: 65.59 kg (144 lb 9.6 oz)  66.769 kg (147 lb 3.2 oz) 65.635 kg (144 lb 11.2 oz)    Examination: General exam: Appears calm and comfortable  Respiratory system: Clear to auscultation. Respiratory effort normal. Cardiovascular system: S1 & S2 heard, RRR. No JVD, murmurs, rubs,  gallops or clicks. No pedal edema. Gastrointestinal system: Abdomen is nondistended, soft and nontender. No organomegaly or masses felt. Normal bowel sounds heard. Central nervous system: Alert and oriented. No focal neurological deficits. Extremities: Symmetric 5 x 5 power. Skin: No rashes, lesions or ulcers Psychiatry: Judgement and insight appear normal. Mood & affect appropriate.   Data Reviewed: I have personally reviewed following labs and imaging studies  CBC:  Recent Labs Lab 01/16/16 1239 01/16/16 1801 01/18/16 0923 01/19/16 0359  WBC 13.1*  --  15.3* 13.3*  NEUTROABS 11.0*  --   --   --   HGB 9.7*  --  9.2* 8.8*  HCT 30.4*  --  28.7* 26.5*  MCV 102.4*  --  101.1* 97.8  PLT 100* 95* 108* 0000000*   Basic Metabolic Panel:  Recent Labs Lab 01/16/16 1239 01/18/16 0923 01/19/16 0359 01/20/16 0309  NA 139 134* 134* 135  K 3.5 2.9* 3.6 3.2*  CL 105 100* 103 100*  CO2 21* 22 22 23   GLUCOSE 141* 222* 135* 117*  BUN 67* 86* 84* 73*  CREATININE 3.48* 3.84* 3.60* 3.02*  CALCIUM 9.5 8.8* 9.0 9.2   GFR: Estimated Creatinine Clearance: 18.7 mL/min (by C-G formula based on Cr of 3.02). Liver Function Tests:  Recent Labs Lab 01/16/16 1239  AST 30  ALT 12*  ALKPHOS 27*  BILITOT 0.7  PROT 7.6  ALBUMIN 4.2   No results for input(s): LIPASE, AMYLASE in the last 168 hours. No results for input(s): AMMONIA in the last 168 hours. Coagulation Profile:  Recent Labs Lab 01/16/16 1801 01/17/16 0424  INR 1.27 1.28   Cardiac Enzymes: No results for input(s): CKTOTAL, CKMB, CKMBINDEX, TROPONINI in the last 168 hours. BNP (last 3 results) No results for input(s): PROBNP in the last 8760 hours. HbA1C: No results for input(s): HGBA1C in the last 72 hours. CBG:  Recent Labs Lab 01/18/16 0634  GLUCAP 132*   Lipid Profile: No results for input(s): CHOL, HDL, LDLCALC, TRIG, CHOLHDL, LDLDIRECT in the last 72 hours. Thyroid Function Tests: No results for input(s): TSH,  T4TOTAL, FREET4, T3FREE, THYROIDAB in the last 72 hours. Anemia Panel: No results for input(s): VITAMINB12, FOLATE, FERRITIN, TIBC, IRON, RETICCTPCT in the last 72 hours. Urine analysis:    Component Value Date/Time   COLORURINE YELLOW 01/16/2016 1419   APPEARANCEUR CLOUDY* 01/16/2016 1419   LABSPEC 1.015 01/16/2016 1419   LABSPEC 1.010 05/19/2015 1231   PHURINE 5.5 01/16/2016 1419   PHURINE 6.0 05/19/2015 1231   GLUCOSEU NEGATIVE 01/16/2016 1419   GLUCOSEU Negative 05/19/2015 1231   HGBUR LARGE* 01/16/2016 1419   HGBUR Negative 05/19/2015 1231   BILIRUBINUR NEGATIVE 01/16/2016 1419   BILIRUBINUR Negative 05/19/2015 1231   KETONESUR NEGATIVE 01/16/2016 1419   KETONESUR Negative 05/19/2015 1231   PROTEINUR 100* 01/16/2016 1419   PROTEINUR < 30 05/19/2015 1231   UROBILINOGEN 0.2 05/19/2015 1231   UROBILINOGEN 0.2 04/03/2014 1911   NITRITE NEGATIVE 01/16/2016 1419   NITRITE Negative 05/19/2015 1231   LEUKOCYTESUR NEGATIVE 01/16/2016 1419   LEUKOCYTESUR Negative 05/19/2015 1231   Sepsis Labs:  Recent Results (from the past 240 hour(s))  Blood culture (routine x 2)     Status: Abnormal   Collection Time: 01/16/16  1:00 PM  Result Value Ref Range Status   Specimen Description LEFT ANTECUBITAL  Final   Special Requests BOTTLES DRAWN AEROBIC AND ANAEROBIC 10CC  Final   Culture  Setup Time   Final    GRAM POSITIVE COCCI IN CLUSTERS IN BOTH AEROBIC AND ANAEROBIC BOTTLES CRITICAL RESULT CALLED TO, READ BACK BY AND VERIFIED WITH: J MARKLE 01/17/16 @ 0927 M VESTAL    Culture (A)  Final    STAPHYLOCOCCUS AUREUS SUSCEPTIBILITIES PERFORMED ON PREVIOUS CULTURE WITHIN THE LAST 5 DAYS.    Report Status 01/19/2016 FINAL  Final  Blood culture (routine x 2)     Status: Abnormal   Collection Time: 01/16/16  1:06 PM  Result Value Ref Range Status   Specimen Description BLOOD LEFT HAND  Final   Special Requests AEROBIC BOTTLE ONLY 10CC  Final   Culture  Setup Time   Final    GRAM POSITIVE  COCCI IN CLUSTERS AEROBIC BOTTLE ONLY CRITICAL RESULT CALLED TO, READ BACK BY AND VERIFIED WITH: J MARKLE 01/17/16 @ 0927 M VESTAL    Culture STAPHYLOCOCCUS AUREUS (A)  Final   Report Status 01/19/2016 FINAL  Final   Organism ID, Bacteria STAPHYLOCOCCUS AUREUS  Final      Susceptibility   Staphylococcus aureus - MIC*    CIPROFLOXACIN >=8 RESISTANT Resistant     ERYTHROMYCIN <=0.25 SENSITIVE Sensitive     GENTAMICIN <=0.5 SENSITIVE Sensitive     OXACILLIN 0.5 SENSITIVE Sensitive     TETRACYCLINE <=1 SENSITIVE Sensitive     VANCOMYCIN <=0.5 SENSITIVE Sensitive     TRIMETH/SULFA <=10 SENSITIVE Sensitive     CLINDAMYCIN <=0.25 SENSITIVE Sensitive     RIFAMPIN <=0.5 SENSITIVE Sensitive     Inducible Clindamycin NEGATIVE Sensitive     * STAPHYLOCOCCUS AUREUS  Blood Culture ID Panel (Reflexed)     Status: Abnormal   Collection Time: 01/16/16  1:06 PM  Result Value Ref Range Status   Enterococcus species NOT DETECTED NOT DETECTED Final   Vancomycin resistance NOT DETECTED NOT DETECTED Final   Listeria monocytogenes NOT DETECTED NOT DETECTED Final   Staphylococcus species DETECTED (A) NOT DETECTED Final    Comment: CRITICAL RESULT CALLED TO, READ BACK BY AND VERIFIED WITH: J MARKLE 01/17/16 @ 0927 M VESTAL    Staphylococcus aureus DETECTED (A) NOT DETECTED Final    Comment: CRITICAL RESULT CALLED TO, READ BACK BY AND VERIFIED WITH: J MARKLE 01/17/16 @ 0927 M VESTAL    Methicillin resistance NOT DETECTED NOT DETECTED Final   Streptococcus species NOT DETECTED NOT DETECTED Final   Streptococcus agalactiae NOT DETECTED NOT DETECTED Final   Streptococcus pneumoniae NOT DETECTED NOT DETECTED Final   Streptococcus pyogenes NOT DETECTED NOT DETECTED Final   Acinetobacter baumannii NOT DETECTED NOT DETECTED Final   Enterobacteriaceae species NOT DETECTED NOT DETECTED Final   Enterobacter cloacae complex NOT DETECTED NOT DETECTED Final   Escherichia coli NOT DETECTED NOT DETECTED Final    Klebsiella oxytoca NOT DETECTED NOT DETECTED Final   Klebsiella pneumoniae NOT DETECTED NOT DETECTED Final   Proteus species NOT DETECTED NOT DETECTED Final   Serratia marcescens NOT DETECTED NOT DETECTED Final   Carbapenem resistance NOT DETECTED NOT DETECTED Final   Haemophilus influenzae NOT DETECTED NOT DETECTED Final   Neisseria meningitidis NOT DETECTED NOT DETECTED Final   Pseudomonas aeruginosa NOT DETECTED NOT DETECTED Final   Candida albicans NOT DETECTED NOT DETECTED Final   Candida glabrata NOT DETECTED NOT DETECTED Final   Candida krusei NOT DETECTED NOT DETECTED Final  Candida parapsilosis NOT DETECTED NOT DETECTED Final   Candida tropicalis NOT DETECTED NOT DETECTED Final  Culture, sputum-assessment     Status: None   Collection Time: 01/16/16 10:24 PM  Result Value Ref Range Status   Specimen Description EXPECTORATED SPUTUM  Final   Special Requests NONE  Final   Sputum evaluation   Final    THIS SPECIMEN IS ACCEPTABLE. RESPIRATORY CULTURE REPORT TO FOLLOW.   Report Status 01/17/2016 FINAL  Final  Culture, respiratory (NON-Expectorated)     Status: None   Collection Time: 01/16/16 10:24 PM  Result Value Ref Range Status   Specimen Description EXPECTORATED SPUTUM  Final   Special Requests NONE  Final   Gram Stain   Final    RARE SQUAMOUS EPITHELIAL CELLS PRESENT ABUNDANT WBC PRESENT,BOTH PMN AND MONONUCLEAR FEW GRAM POSITIVE COCCI IN PAIRS IN CLUSTERS    Culture Consistent with normal respiratory flora.  Final   Report Status 01/19/2016 FINAL  Final  Culture, blood (routine x 2)     Status: None (Preliminary result)   Collection Time: 01/17/16  9:50 AM  Result Value Ref Range Status   Specimen Description BLOOD BLOOD LEFT ARM  Final   Special Requests BOTTLES DRAWN AEROBIC ONLY 5CC  Final   Culture NO GROWTH 3 DAYS  Final   Report Status PENDING  Incomplete  Culture, blood (routine x 2)     Status: None (Preliminary result)   Collection Time: 01/17/16  9:50 AM   Result Value Ref Range Status   Specimen Description BLOOD LEFT ANTECUBITAL  Final   Special Requests BOTTLES DRAWN AEROBIC ONLY 5CC  Final   Culture NO GROWTH 3 DAYS  Final   Report Status PENDING  Incomplete  Body fluid culture     Status: None (Preliminary result)   Collection Time: 01/19/16  4:37 PM  Result Value Ref Range Status   Specimen Description FLUID SYNOVIAL WRIST LEFT  Final   Special Requests NONE  Final   Gram Stain   Final    ABUNDANT WBC PRESENT,BOTH PMN AND MONONUCLEAR NO ORGANISMS SEEN    Culture NO GROWTH < 24 HOURS  Final   Report Status PENDING  Incomplete     Invalid input(s): PROCALCITONIN, LACTICACIDVEN   Radiology Studies: Dg Wrist 2 Views Left  01/18/2016  CLINICAL DATA:  Acute onset of left wrist pain.  Initial encounter. EXAM: LEFT WRIST - 2 VIEW COMPARISON:  None. FINDINGS: There is no evidence of fracture or dislocation. There is joint space narrowing along the radiocarpal joint, and at the radial aspect of the carpal rows. This resolves in mild chronic deformity of the scaphoid and lunate, with expansion of the scapholunate distance to 5 mm. Scattered vascular calcifications are seen. No additional soft tissue abnormalities are characterized on radiograph. IMPRESSION: 1. No evidence of fracture or dislocation. 2. Degenerative joint space narrowing along the radiocarpal joints, and at the radial aspect of the carpal rows. This results in mild chronic deformity of the scaphoid and lunate, with expansion of the scapholunate distance to 5 mm, reflecting scapholunate dissociation. Electronically Signed   By: Garald Balding M.D.   On: 01/18/2016 16:57        Scheduled Meds: . amiodarone  100 mg Oral Daily  . atorvastatin  80 mg Oral QHS  . Calcifediol ER  30 mcg Oral QHS  . carvedilol  25 mg Oral BID WC  .  ceFAZolin (ANCEF) IV  2 g Intravenous Q12H  . cloNIDine  0.1 mg  Oral BID  . colchicine  0.6 mg Oral Daily  . hydrALAZINE  50 mg Oral TID  .  isosorbide mononitrate  120 mg Oral Daily  . multivitamin with minerals  1 tablet Oral q morning - 10a  . rifampin  600 mg Oral Daily  . tamsulosin  0.4 mg Oral QPC supper   Continuous Infusions: . sodium chloride 10 mL/hr at 01/16/16 1902     LOS: 4 days    Time spent: 35 minutes    Ronney Honeywell A, MD [facility] Pager [phone]  If 7PM-7AM, please contact night-coverage [email or web address] Password [facility] 01/20/2016, 3:32 PM

## 2016-01-20 NOTE — Progress Notes (Signed)
Patient ID: [name] Brow., male   DOB: 1935-07-30, 80 y.o.   MRN: [id number]         [facility] for Infectious Disease    Date of Admission:  01/16/2016           Day 5 antibiotics  Principal Problem:   Staphylococcus aureus bacteremia Active Problems:   CAP (community acquired pneumonia)   Pacemaker   PAF (paroxysmal atrial fibrillation) (HCC)   CKD (chronic kidney disease), stage IV (HCC)   Pneumonia   . amiodarone  100 mg Oral Daily  . atorvastatin  80 mg Oral QHS  . Calcifediol ER  30 mcg Oral QHS  . carvedilol  25 mg Oral BID WC  .  ceFAZolin (ANCEF) IV  2 g Intravenous Q12H  . cloNIDine  0.1 mg Oral BID  . colchicine  0.6 mg Oral Daily  . hydrALAZINE  50 mg Oral TID  . isosorbide mononitrate  120 mg Oral Daily  . multivitamin with minerals  1 tablet Oral q morning - 10a  . rifampin  600 mg Oral Daily  . tamsulosin  0.4 mg Oral QPC supper    SUBJECTIVE: He is feeling a little bit better. He is no longer coughing up any blood and he is not coughing as much. He has developed a very painful left wrist. It was aspirated yesterday and showed 23,415 white blood cells with 95% segmented neutrophils. No organisms were seen on stain and cultures are negative at 24 hours. Transthoracic echocardiogram showed a 0.8 x 0.7 cm mobile echodensity on the pacer wire in his right atrium.  Review of Systems: Review of Systems  Constitutional: Negative for fever, chills and diaphoresis.  Respiratory: Positive for cough and sputum production. Negative for hemoptysis.   Cardiovascular: Negative for chest pain.  Musculoskeletal: Positive for joint pain.    Past Medical History  Diagnosis Date  . Hypertension   . Hyperlipidemia   . GERD (gastroesophageal reflux disease)   . DVT (deep venous thrombosis) ([place]) 2011    Right arm  . Atrial fibrillation ([place])   . Gout   . Depressive disorder   . Internal hemorrhoids without mention of complication   . Gastroparesis   .  Osteopenia   . Ischemic colitis ([place])   . Peripheral artery disease ([place])   . Chronic diastolic heart failure ([place])   . 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 Renal artery stenosis ([place])   . CKD (chronic kidney disease)   . Vitamin B 12 deficiency   . CAD (coronary artery disease)     s/b CABG 1994, and subsequent stents. Repeat CABG 12/2011,  . Prostate cancer ([place]) 1997    XRT and lupron  . Myelodysplastic syndrome ([place]) 05/22/2013    With low hemoglobin and platelets treated with Procrit  . Angiomyolipoma 2009    On both kidneys noted in 2009  . Sick sinus syndrome ([place])   . Peptic ulcer     S/p partial gastrectomy in 1969  . Arthritis     neck and left wrist  . History of hiatal hernia   . Diabetes mellitus type 2 with peripheral artery disease (HCC)     DIET CONTROLLED  . Pneumonia 01/16/2016    Social History  Substance Use Topics  . Smoking status: Former Smoker -- 20 years    Types: Cigarettes    Quit date: 07/12/1969  . Smokeless tobacco: Never Used  . Alcohol Use: No    Family History  Problem Relation Age of Onset  . Diabetes Mother   . Heart disease Mother     Heart Disease before age 84  . Hyperlipidemia Mother   . Hypertension Mother   . CVA Mother 27    cause of death  . Cancer Father     stomach/liver  . Hypertension Father     possibly hypertensive  . Cancer Sister     Breast cancer  . Heart disease Daughter     Heart Disease before age 13  . Hypertension Daughter   . Heart attack Daughter   . CAD Brother   . Heart disease Brother   . Cancer Paternal Uncle     colon  . Heart disease Sister   . Diabetes Sister   . Hypertension Sister    Allergies  Allergen Reactions  . Nsaids Other (See Comments)    GI issue  . Ibuprofen Other (See Comments)    GI Issues  . Ace Inhibitors Cough    OBJECTIVE: Filed Vitals:   01/19/16 1147 01/19/16 2055 01/20/16 0455 01/20/16 1000  BP: 146/59 180/66 149/62 164/69  Pulse: 73 79 76   Temp: 99.6 F (37.6 C)  100 F (37.8 C) 99.6 F (37.6 C)   TempSrc: Oral Oral Oral   Resp: 16 16 16    Height:      Weight:   144 lb 11.2 oz (65.635 kg)   SpO2: 99% 97% 97%    Body mass index is 20.76 kg/(m^2).  Physical Exam  Constitutional:  He looks a little better today. He is sitting up in a chair.  Cardiovascular: Normal rate and regular rhythm.   No murmur heard. Pulmonary/Chest: Effort normal. He has no rales.  Musculoskeletal:  His left wrist is splinted.    Lab Results Lab Results  Component Value Date   WBC 13.3* 01/19/2016   HGB 8.8* 01/19/2016   HCT 26.5* 01/19/2016   MCV 97.8 01/19/2016   PLT 109* 01/19/2016    Lab Results  Component Value Date   CREATININE 3.02* 01/20/2016   BUN 73* 01/20/2016   NA 135 01/20/2016   K 3.2* 01/20/2016   CL 100* 01/20/2016   CO2 23 01/20/2016    Lab Results  Component Value Date   ALT 12* 01/16/2016   AST 30 01/16/2016   ALKPHOS 27* 01/16/2016   BILITOT 0.7 01/16/2016     Microbiology: Recent Results (from the past 240 hour(s))  Blood culture (routine x 2)     Status: Abnormal   Collection Time: 01/16/16  1:00 PM  Result Value Ref Range Status   Specimen Description LEFT ANTECUBITAL  Final   Special Requests BOTTLES DRAWN AEROBIC AND ANAEROBIC 10CC  Final   Culture  Setup Time   Final    GRAM POSITIVE COCCI IN CLUSTERS IN BOTH AEROBIC AND ANAEROBIC BOTTLES CRITICAL RESULT CALLED TO, READ BACK BY AND VERIFIED WITH: J MARKLE 01/17/16 @ 0927 M VESTAL    Culture (A)  Final    STAPHYLOCOCCUS AUREUS SUSCEPTIBILITIES PERFORMED ON PREVIOUS CULTURE WITHIN THE LAST 5 DAYS.    Report Status 01/19/2016 FINAL  Final  Blood culture (routine x 2)     Status: Abnormal   Collection Time: 01/16/16  1:06 PM  Result Value Ref Range Status   Specimen Description BLOOD LEFT HAND  Final   Special Requests AEROBIC BOTTLE ONLY 10CC  Final   Culture  Setup Time   Final    GRAM POSITIVE COCCI IN CLUSTERS AEROBIC BOTTLE ONLY  CRITICAL RESULT CALLED TO,  READ BACK BY AND VERIFIED WITH: J MARKLE 01/17/16 @ 0927 M VESTAL    Culture STAPHYLOCOCCUS AUREUS (A)  Final   Report Status 01/19/2016 FINAL  Final   Organism ID, Bacteria STAPHYLOCOCCUS AUREUS  Final      Susceptibility   Staphylococcus aureus - MIC*    CIPROFLOXACIN >=8 RESISTANT Resistant     ERYTHROMYCIN <=0.25 SENSITIVE Sensitive     GENTAMICIN <=0.5 SENSITIVE Sensitive     OXACILLIN 0.5 SENSITIVE Sensitive     TETRACYCLINE <=1 SENSITIVE Sensitive     VANCOMYCIN <=0.5 SENSITIVE Sensitive     TRIMETH/SULFA <=10 SENSITIVE Sensitive     CLINDAMYCIN <=0.25 SENSITIVE Sensitive     RIFAMPIN <=0.5 SENSITIVE Sensitive     Inducible Clindamycin NEGATIVE Sensitive     * STAPHYLOCOCCUS AUREUS  Blood Culture ID Panel (Reflexed)     Status: Abnormal   Collection Time: 01/16/16  1:06 PM  Result Value Ref Range Status   Enterococcus species NOT DETECTED NOT DETECTED Final   Vancomycin resistance NOT DETECTED NOT DETECTED Final   Listeria monocytogenes NOT DETECTED NOT DETECTED Final   Staphylococcus species DETECTED (A) NOT DETECTED Final    Comment: CRITICAL RESULT CALLED TO, READ BACK BY AND VERIFIED WITH: J MARKLE 01/17/16 @ 0927 M VESTAL    Staphylococcus aureus DETECTED (A) NOT DETECTED Final    Comment: CRITICAL RESULT CALLED TO, READ BACK BY AND VERIFIED WITH: J MARKLE 01/17/16 @ 0927 M VESTAL    Methicillin resistance NOT DETECTED NOT DETECTED Final   Streptococcus species NOT DETECTED NOT DETECTED Final   Streptococcus agalactiae NOT DETECTED NOT DETECTED Final   Streptococcus pneumoniae NOT DETECTED NOT DETECTED Final   Streptococcus pyogenes NOT DETECTED NOT DETECTED Final   Acinetobacter baumannii NOT DETECTED NOT DETECTED Final   Enterobacteriaceae species NOT DETECTED NOT DETECTED Final   Enterobacter cloacae complex NOT DETECTED NOT DETECTED Final   Escherichia coli NOT DETECTED NOT DETECTED Final   Klebsiella oxytoca NOT DETECTED NOT DETECTED Final   Klebsiella pneumoniae  NOT DETECTED NOT DETECTED Final   Proteus species NOT DETECTED NOT DETECTED Final   Serratia marcescens NOT DETECTED NOT DETECTED Final   Carbapenem resistance NOT DETECTED NOT DETECTED Final   Haemophilus influenzae NOT DETECTED NOT DETECTED Final   Neisseria meningitidis NOT DETECTED NOT DETECTED Final   Pseudomonas aeruginosa NOT DETECTED NOT DETECTED Final   Candida albicans NOT DETECTED NOT DETECTED Final   Candida glabrata NOT DETECTED NOT DETECTED Final   Candida krusei NOT DETECTED NOT DETECTED Final   Candida parapsilosis NOT DETECTED NOT DETECTED Final   Candida tropicalis NOT DETECTED NOT DETECTED Final  Culture, sputum-assessment     Status: None   Collection Time: 01/16/16 10:24 PM  Result Value Ref Range Status   Specimen Description EXPECTORATED SPUTUM  Final   Special Requests NONE  Final   Sputum evaluation   Final    THIS SPECIMEN IS ACCEPTABLE. RESPIRATORY CULTURE REPORT TO FOLLOW.   Report Status 01/17/2016 FINAL  Final  Culture, respiratory (NON-Expectorated)     Status: None   Collection Time: 01/16/16 10:24 PM  Result Value Ref Range Status   Specimen Description EXPECTORATED SPUTUM  Final   Special Requests NONE  Final   Gram Stain   Final    RARE SQUAMOUS EPITHELIAL CELLS PRESENT ABUNDANT WBC PRESENT,BOTH PMN AND MONONUCLEAR FEW GRAM POSITIVE COCCI IN PAIRS IN CLUSTERS    Culture Consistent with normal respiratory flora.  Final  Report Status 01/19/2016 FINAL  Final  Culture, blood (routine x 2)     Status: None (Preliminary result)   Collection Time: 01/17/16  9:50 AM  Result Value Ref Range Status   Specimen Description BLOOD BLOOD LEFT ARM  Final   Special Requests BOTTLES DRAWN AEROBIC ONLY 5CC  Final   Culture NO GROWTH 2 DAYS  Final   Report Status PENDING  Incomplete  Culture, blood (routine x 2)     Status: None (Preliminary result)   Collection Time: 01/17/16  9:50 AM  Result Value Ref Range Status   Specimen Description BLOOD LEFT  ANTECUBITAL  Final   Special Requests BOTTLES DRAWN AEROBIC ONLY 5CC  Final   Culture NO GROWTH 2 DAYS  Final   Report Status PENDING  Incomplete  Body fluid culture     Status: None (Preliminary result)   Collection Time: 01/19/16  4:37 PM  Result Value Ref Range Status   Specimen Description FLUID SYNOVIAL WRIST LEFT  Final   Special Requests NONE  Final   Gram Stain   Final    ABUNDANT WBC PRESENT,BOTH PMN AND MONONUCLEAR NO ORGANISMS SEEN    Culture NO GROWTH < 24 HOURS  Final   Report Status PENDING  Incomplete     ASSESSMENT: 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 I will continue cefazolin and rifampin. Repeat blood cultures are negative at 72 hours so it's safe to go ahead and have a PICC placed. I would recommend cardiology consultation for consideration of pacemaker removal. They may want to proceed with TEE first but I'm not sure that's necessary.  PLAN: 1. Continue cefazolin and rifampin 2. Okay for PICC placement 3. Recommend cardiology evaluation  Michel Bickers, MD [facility] [phone] pager   [phone] cell 01/20/2016, 2:06 PM

## 2016-01-20 NOTE — Progress Notes (Signed)
Pharmacy Antibiotic Note  [name]. is a 80 y.o. male admitted on 01/16/2016 with multifocal pna per chest CT and BCID + MSSA bacteremia. Pt also has pacemaker.  Pharmacy has been consulted for cefazolin dosing.   Plan: This patient's current antibiotics will be continued without adjustments.  Cefazolin 2g IV q12h   Height: 5\' 10"  (177.8 cm) Weight: 144 lb 11.2 oz (65.635 kg) (scale c) IBW/kg (Calculated) : 73  Temp (24hrs), Avg:99.7 F (37.6 C), Min:99.6 F (37.6 C), Max:100 F (37.8 C)   Recent Labs Lab 01/16/16 1239 01/16/16 1245 01/18/16 0923 01/19/16 0359 01/20/16 0309  WBC 13.1*  --  15.3* 13.3*  --   CREATININE 3.48*  --  3.84* 3.60* 3.02*  LATICACIDVEN  --  2.43*  --   --   --     Estimated Creatinine Clearance: 18.7 mL/min (by C-G formula based on Cr of 3.02).    Allergies  Allergen Reactions  . Nsaids Other (See Comments)    GI issue  . Ibuprofen Other (See Comments)    GI Issues  . Ace Inhibitors Cough    Antimicrobials this admission: Cefazolin 7/8 >> Rifampin 7/8 >>   Dose adjustments this admission: none  Microbiology results: 7/7 BCx: MSSA 2/2   Thank you for allowing pharmacy to be a part of this patient's care.  Carlean Jews 01/20/2016 11:03 AM  I discussed / reviewed the pharmacy note by Dr. Thurmond Butts and I agree with the resident's findings and plans as documented.  Seniah Lawrence S. Alford Highland, PharmD, [place] Clinical Staff Pharmacist Pager [phone]

## 2016-01-21 ENCOUNTER — Inpatient Hospital Stay (HOSPITAL_COMMUNITY): Payer: Medicare Other | Admitting: Anesthesiology

## 2016-01-21 ENCOUNTER — Encounter (HOSPITAL_COMMUNITY): Payer: Self-pay | Admitting: *Deleted

## 2016-01-21 ENCOUNTER — Encounter (HOSPITAL_COMMUNITY)
Admission: EM | Disposition: A | Discharge status: Skilled Nursing Facility | Payer: Self-pay | Source: Home / Self Care | Attending: Internal Medicine

## 2016-01-21 DIAGNOSIS — M25432 Effusion, left wrist: Secondary | ICD-10-CM

## 2016-01-21 HISTORY — PX: I&D EXTREMITY: SHX5045

## 2016-01-21 LAB — BASIC METABOLIC PANEL
ANION GAP: 8 (ref 5–15)
BUN: 71 mg/dL — ABNORMAL HIGH (ref 6–20)
CHLORIDE: 100 mmol/L — AB (ref 101–111)
CO2: 24 mmol/L (ref 22–32)
Calcium: 9 mg/dL (ref 8.9–10.3)
Creatinine, Ser: 3.06 mg/dL — ABNORMAL HIGH (ref 0.61–1.24)
GFR calc non Af Amer: 18 mL/min — ABNORMAL LOW (ref >60)
GFR, EST AFRICAN AMERICAN: 21 mL/min — AB (ref >60)
Glucose, Bld: 128 mg/dL — ABNORMAL HIGH (ref 65–99)
POTASSIUM: 4.3 mmol/L (ref 3.5–5.1)
SODIUM: 132 mmol/L — AB (ref 135–145)

## 2016-01-21 LAB — ACID FAST SMEAR (AFB, MYCOBACTERIA): Acid Fast Smear: NEGATIVE

## 2016-01-21 SURGERY — IRRIGATION AND DEBRIDEMENT EXTREMITY
Anesthesia: General | Site: Wrist | Laterality: Left

## 2016-01-21 MED ORDER — LIDOCAINE HCL (PF) 1 % IJ SOLN
INTRAMUSCULAR | Status: AC
  Filled 2016-01-21: qty 30

## 2016-01-21 MED ORDER — EPHEDRINE 5 MG/ML INJ
INTRAVENOUS | Status: AC
  Filled 2016-01-21: qty 10

## 2016-01-21 MED ORDER — PROPOFOL 10 MG/ML IV BOLUS
INTRAVENOUS | Status: DC | PRN
  Administered 2016-01-21: 100 mg via INTRAVENOUS

## 2016-01-21 MED ORDER — ONDANSETRON HCL 4 MG/2ML IJ SOLN
INTRAMUSCULAR | Status: DC | PRN
  Administered 2016-01-21: 4 mg via INTRAVENOUS

## 2016-01-21 MED ORDER — EPHEDRINE SULFATE 50 MG/ML IJ SOLN
INTRAMUSCULAR | Status: DC | PRN
  Administered 2016-01-21: 10 mg via INTRAVENOUS
  Administered 2016-01-21 (×2): 15 mg via INTRAVENOUS

## 2016-01-21 MED ORDER — LIDOCAINE 2% (20 MG/ML) 5 ML SYRINGE
INTRAMUSCULAR | Status: AC
  Filled 2016-01-21: qty 5

## 2016-01-21 MED ORDER — 0.9 % SODIUM CHLORIDE (POUR BTL) OPTIME
TOPICAL | Status: DC | PRN
  Administered 2016-01-21: 1000 mL

## 2016-01-21 MED ORDER — CEFAZOLIN SODIUM-DEXTROSE 2-4 GM/100ML-% IV SOLN
2.0000 g | Freq: Two times a day (BID) | INTRAVENOUS | Status: DC
  Administered 2016-01-21 – 2016-01-24 (×9): 2 g via INTRAVENOUS
  Filled 2016-01-21 (×10): qty 100

## 2016-01-21 MED ORDER — MEPERIDINE HCL 25 MG/ML IJ SOLN: 6.2500 mg | INTRAMUSCULAR | Status: DC | PRN

## 2016-01-21 MED ORDER — HYDROMORPHONE HCL 1 MG/ML IJ SOLN
INTRAMUSCULAR | Status: AC
  Filled 2016-01-21: qty 1

## 2016-01-21 MED ORDER — HYDROMORPHONE HCL 1 MG/ML IJ SOLN
0.2500 mg | INTRAMUSCULAR | Status: DC | PRN
  Administered 2016-01-21: 0.25 mg via INTRAVENOUS

## 2016-01-21 MED ORDER — LIDOCAINE HCL (CARDIAC) 20 MG/ML IV SOLN
INTRAVENOUS | Status: DC | PRN
  Administered 2016-01-21: 100 mg via INTRAVENOUS

## 2016-01-21 MED ORDER — PHENYLEPHRINE 40 MCG/ML (10ML) SYRINGE FOR IV PUSH (FOR BLOOD PRESSURE SUPPORT)
PREFILLED_SYRINGE | INTRAVENOUS | Status: AC
  Filled 2016-01-21: qty 10

## 2016-01-21 MED ORDER — FENTANYL CITRATE (PF) 250 MCG/5ML IJ SOLN
INTRAMUSCULAR | Status: AC
  Filled 2016-01-21: qty 5

## 2016-01-21 MED ORDER — SODIUM CHLORIDE 0.9 % IV SOLN
INTRAVENOUS | Status: DC | PRN
  Administered 2016-01-21: 18:00:00 via INTRAVENOUS

## 2016-01-21 MED ORDER — ONDANSETRON HCL 4 MG/2ML IJ SOLN
INTRAMUSCULAR | Status: AC
  Filled 2016-01-21: qty 2

## 2016-01-21 MED ORDER — CEFAZOLIN SODIUM 1 G IJ SOLR
INTRAMUSCULAR | Status: AC
  Filled 2016-01-21: qty 20

## 2016-01-21 MED ORDER — PHENYLEPHRINE HCL 10 MG/ML IJ SOLN
INTRAMUSCULAR | Status: DC | PRN
  Administered 2016-01-21: 40 ug via INTRAVENOUS
  Administered 2016-01-21: 160 ug via INTRAVENOUS
  Administered 2016-01-21 (×2): 120 ug via INTRAVENOUS
  Administered 2016-01-21: 80 ug via INTRAVENOUS

## 2016-01-21 MED ORDER — BUPIVACAINE-EPINEPHRINE (PF) 0.5% -1:200000 IJ SOLN
INTRAMUSCULAR | Status: AC
  Filled 2016-01-21: qty 30

## 2016-01-21 MED ORDER — PROPOFOL 10 MG/ML IV BOLUS
INTRAVENOUS | Status: AC
  Filled 2016-01-21: qty 20

## 2016-01-21 MED ORDER — FENTANYL CITRATE (PF) 250 MCG/5ML IJ SOLN
INTRAMUSCULAR | Status: DC | PRN
  Administered 2016-01-21: 100 ug via INTRAVENOUS

## 2016-01-21 MED ORDER — PROMETHAZINE HCL 25 MG/ML IJ SOLN: 6.2500 mg | INTRAMUSCULAR | Status: DC | PRN

## 2016-01-21 MED ORDER — PHENYLEPHRINE HCL 10 MG/ML IJ SOLN
10.0000 mg | INTRAVENOUS | Status: DC | PRN
  Administered 2016-01-21: 100 ug/min via INTRAVENOUS

## 2016-01-21 SURGICAL SUPPLY — 47 items
BANDAGE COBAN STERILE 2 (GAUZE/BANDAGES/DRESSINGS) IMPLANT
BLADE MINI RND TIP GREEN BEAV (BLADE) IMPLANT
BLADE SURG 15 STRL LF DISP TIS (BLADE) ×2 IMPLANT
BLADE SURG 15 STRL SS (BLADE) ×6
BNDG COHESIVE 4X5 TAN STRL (GAUZE/BANDAGES/DRESSINGS) ×3 IMPLANT
BNDG GAUZE ELAST 4 BULKY (GAUZE/BANDAGES/DRESSINGS) ×3 IMPLANT
CHLORAPREP W/TINT 26ML (MISCELLANEOUS) ×3 IMPLANT
CORDS BIPOLAR (ELECTRODE) IMPLANT
COVER BACK TABLE 60X90IN (DRAPES) ×3 IMPLANT
COVER MAYO STAND STRL (DRAPES) ×3 IMPLANT
CUFF TOURNIQUET SINGLE 18IN (TOURNIQUET CUFF) IMPLANT
DRAIN SNY WOU 7FLT (WOUND CARE) ×3 IMPLANT
DRAPE EXTREMITY T 121X128X90 (DRAPE) ×3 IMPLANT
DRAPE SURG 17X23 STRL (DRAPES) ×3 IMPLANT
DRSG EMULSION OIL 3X3 NADH (GAUZE/BANDAGES/DRESSINGS) ×3 IMPLANT
EVACUATOR SILICONE 100CC (DRAIN) ×3 IMPLANT
GAUZE SPONGE 4X4 12PLY STRL (GAUZE/BANDAGES/DRESSINGS) ×3 IMPLANT
GLOVE BIO SURGEON STRL SZ7.5 (GLOVE) ×3 IMPLANT
GLOVE BIOGEL PI IND STRL 7.0 (GLOVE) ×1 IMPLANT
GLOVE BIOGEL PI IND STRL 8 (GLOVE) ×1 IMPLANT
GLOVE BIOGEL PI INDICATOR 7.0 (GLOVE) ×2
GLOVE BIOGEL PI INDICATOR 8 (GLOVE) ×2
GLOVE ECLIPSE 6.5 STRL STRAW (GLOVE) ×3 IMPLANT
GOWN STRL REUS W/ TWL LRG LVL3 (GOWN DISPOSABLE) ×2 IMPLANT
GOWN STRL REUS W/TWL LRG LVL3 (GOWN DISPOSABLE) ×4
GOWN STRL REUS W/TWL XL LVL3 (GOWN DISPOSABLE) ×3 IMPLANT
NEEDLE HYPO 25X1 1.5 SAFETY (NEEDLE) IMPLANT
NS IRRIG 1000ML POUR BTL (IV SOLUTION) ×3 IMPLANT
PACK BASIN DAY SURGERY FS (CUSTOM PROCEDURE TRAY) ×3 IMPLANT
PADDING CAST ABS 4INX4YD NS (CAST SUPPLIES) ×2
PADDING CAST ABS COTTON 4X4 ST (CAST SUPPLIES) ×1 IMPLANT
RUBBERBAND STERILE (MISCELLANEOUS) IMPLANT
SET IRRIG Y TYPE TUR BLADDER L (SET/KITS/TRAYS/PACK) IMPLANT
SPLINT PLASTER EXTRA FAST 3X15 (CAST SUPPLIES) ×2
SPLINT PLASTER GYPS XFAST 3X15 (CAST SUPPLIES) ×1 IMPLANT
SPONGE GAUZE 4X4 12PLY STER LF (GAUZE/BANDAGES/DRESSINGS) ×3 IMPLANT
STOCKINETTE 6  STRL (DRAPES) ×2
STOCKINETTE 6 STRL (DRAPES) ×1 IMPLANT
SUT ETHILON 3 0 PS 1 (SUTURE) ×3 IMPLANT
SUT MON AB 4-0 RB1 27 (SUTURE) ×3 IMPLANT
SUT VICRYL RAPIDE 4-0 (SUTURE) IMPLANT
SUT VICRYL RAPIDE 4/0 PS 2 (SUTURE) IMPLANT
SYR BULB 3OZ (MISCELLANEOUS) ×3 IMPLANT
SYRINGE 10CC LL (SYRINGE) IMPLANT
TOWEL OR 17X24 6PK STRL BLUE (TOWEL DISPOSABLE) ×3 IMPLANT
TOWEL OR NON WOVEN STRL DISP B (DISPOSABLE) ×3 IMPLANT
UNDERPAD 30X30 (UNDERPADS AND DIAPERS) ×3 IMPLANT

## 2016-01-21 NOTE — Op Note (Signed)
01/16/2016 - 01/21/2016  6:04 PM  PATIENT:  [name] Marble Sr.  80 y.o. male  PRE-OPERATIVE DIAGNOSIS:  Possible septic left wrist  POST-OPERATIVE DIAGNOSIS:  Same  PROCEDURE:   1. Arthrotomy of left wrist joint, with irrigation and drainage    2.  Left dorsal third compartment tenosynovectomy  SURGEON: Rayvon Char. Grandville Silos, MD  PHYSICIAN ASSISTANT: none  ANESTHESIA:  general  SPECIMENS:  Fluid for cell count and crystals; swabs for gram stain and culture  DRAINS:   7 mm JP drain x 1  EBL:  less than 50 mL  PREOPERATIVE INDICATIONS:  [name]. is a  80 y.o. male with a h/o gout and OSSA bacteremia associated with pneumonia and bacterial pacemaker endocarditis who has a swollen and painful left wrist, suspicious for joint sepsis.  The risks benefits and alternatives were discussed with the patient preoperatively including but not limited to the risks of infection, bleeding, nerve injury, cardiopulmonary complications, the need for revision surgery, among others, and the patient verbalized understanding and consented to proceed.  OPERATIVE IMPLANTS: none  OPERATIVE PROCEDURE:  The patient was escorted to the operative theatre and placed in a supine position.  General anesthesia was administered.  A surgical "time-out" was performed during which the planned procedure, proposed operative site, and the correct patient identity were compared to the operative consent and agreement confirmed by the circulating nurse according to current facility policy.  Following application of a tourniquet to the operative extremity, the exposed skin was prepped with Chloraprep and draped in the usual sterile fashion.  The limb was exsanguinated with gravity and the tourniquet inflated to approximately 164mmHg higher than systolic BP.  The radiocarpal joint was entered with an 18-gauge needle, and the proximally 1 mL of thick tan fluid was obtained.  It was sent for cell count and crystal  analysis.  A 2 limb zigzag incision was made over the dorsum of the  left dorsal wrist.  Skin flap was elevated full-thickness.  The extensor retinaculum was split in line with the EPL tendon.  There was a collection of the same type of fluid evident within the compartment, with ultimately a connection that went to the radiocarpal joint.  There was thickened tenosynovium about the EPL tendon and this was excisionally debrided.  The tendon was temporally transposed.  The fourth compartment tendons were reflected ulnarly and a ligament sparing V-shaped arthrotomy of the wrist was performed.  The same type of fluid to a lesser degree was obtained from the radiocarpal joint.  Cartilage of the radiocarpal joint was largely degraded, with areas that were bone-on-bone.  The dorsal scapholunate ligament was intact, but the membranous central portion was not, allowing communication between the midcarpal and radiocarpal joint.  The radiocarpal joint was irrigated copiously with almost 2 L of irrigant, some of which was directed into the midcarpal joint and hemostat used to create a dorsal midcarpal arthrotomy that allowed through and through irrigation of the fluid through the scapholunate space into the midcarpal joint and out.  The irrigant was eventually clear.  The arthrotomy was only partially closed with 4-0 Monocryl suture.  The EPL tendon was returned to its bed and the extensor retinaculum secured back over at the level of Lister's tubercle to reestablish restraint for this tendon.  The tourniquet was released, some additional hemostasis obtained, and a flat JP drain was placed in the subcutaneous space over the dorsal wrist, exiting a separate stab wound proximally.  The skin was  closed with 3-0 nylon interrupted sutures and a bulky splint dressing applied with a volar plaster component, placing the wrist in slight extension.  He was awakened and taken to the recovery room in stable condition, breathing  spontaneously  DISPOSITION: He will be returned to the floor for continued care.

## 2016-01-21 NOTE — Anesthesia Preprocedure Evaluation (Addendum)
Anesthesia Evaluation  Patient identified by MRN, date of birth, ID band Patient awake    Reviewed: Allergy & Precautions, NPO status , Patient's Chart, lab work & pertinent test results, reviewed documented beta blocker date and time   History of Anesthesia Complications Negative for: history of anesthetic complications  Airway Mallampati: II  TM Distance: >3 FB Neck ROM: Full    Dental no notable dental hx. (+) Dental Advisory Given   Pulmonary pneumonia, former smoker,    Pulmonary exam normal breath sounds clear to auscultation       Cardiovascular hypertension, Pt. on medications and Pt. on home beta blockers + CAD, + Past MI, + CABG, + Peripheral Vascular Disease and +CHF  Normal cardiovascular exam+ dysrhythmias (SSS) + pacemaker  Rhythm:Regular Rate:Normal  Echo 01/19/2016 - Left ventricle: The cavity size was normal. Wall thickness was increased in a pattern of severe LVH. Systolic function was normal. There was dynamic obstruction, with a peak velocity of 351 cm/sec and a peak gradient of 49 mm Hg during Valsalva. Wall motion was normal; there were no regional wall motion abnormalities. Doppler parameters are consistent with abnormal left ventricular relaxation (grade 1 diastolic dysfunction). - Aortic valve: Trileaflet; moderately thickened, moderately  calcified leaflets. - Aorta: Aortic root dimension: 39 mm (ED   Pacer:DDDR, 47% AP-VS, 1.7% VP   Neuro/Psych PSYCHIATRIC DISORDERS Anxiety Depression negative neurological ROS     GI/Hepatic negative GI ROS, Neg liver ROS, hiatal hernia, PUD, GERD  Medicated and Controlled,  Endo/Other  diabetes  Renal/GU Renal disease     Musculoskeletal  (+) Arthritis ,   Abdominal   Peds  Hematology  (+) anemia , Myelodysplastic syndrome   Anesthesia Other Findings   Reproductive/Obstetrics                          Anesthesia  Physical  Anesthesia Plan  ASA: III  Anesthesia Plan: General   Post-op Pain Management:    Induction: Intravenous  Airway Management Planned: LMA  Additional Equipment:   Intra-op Plan:   Post-operative Plan: Extubation in OR  Informed Consent: I have reviewed the patients History and Physical, chart, labs and discussed the procedure including the risks, benefits and alternatives for the proposed anesthesia with the patient or authorized representative who has indicated his/her understanding and acceptance.   Dental advisory given  Plan Discussed with: CRNA  Anesthesia Plan Comments:         Anesthesia Quick Evaluation

## 2016-01-21 NOTE — Clinical Social Work Note (Signed)
Clinical Social Work Assessment  Patient Details  Name: [name] FEBLES Sr. MRN: [id number] Date of Birth: February 05, 1936  Date of referral:  01/21/16               Reason for consult:  Facility Placement, Discharge Planning                Permission sought to share information with:  Facility Sport and exercise psychologist, Family Supports Permission granted to share information::  Yes, Verbal Permission Granted  Name::     Social research officer, government::  SNF's  Relationship::  Wife  Contact Information:  [phone]  Housing/Transportation Living arrangements for the past 2 months:  Single Family Home Source of Information:  Patient, Medical Team, Spouse Patient Interpreter Needed:  None Criminal Activity/Legal Involvement Pertinent to Current Situation/Hospitalization:  No - Comment as needed Significant Relationships:  Adult Children, Spouse Lives with:  Spouse Do you feel safe going back to the place where you live?  Yes Need for family participation in patient care:  Yes (Comment)  Care giving concerns:  PT recommending SNF once medically stable.   Social Worker assessment / plan:  CSW met with patient. No supports at bedside. CSW introduced role and explained that PT recommendations would be discussed. Patient agreeable to SNF "if it will help." Patient has no preference on facility. CSW called patient's wife to make her aware. Her first preference is [facility]. Will notify admissions coordinator. No further concerns. CSW encouraged patient and his wife to contact CSW as needed. CSW will continue to follow patient and facilitate discharge to SNF once medically stable.  Employment status:  Retired Nurse, adult PT Recommendations:  [name] / Referral to community resources:  [place]  Patient/Family's Response to care:  Patient and his wife agreeable to SNF placement. Patient's family supportive and involved in  patient's care. Patient and his wife appreciated social work intervention.  Patient/Family's Understanding of and Emotional Response to Diagnosis, Current Treatment, and Prognosis:  Patient and his wife understand the need for rehab prior to returning home. Patient and his wife appear happy with hospital care at this time.  Emotional Assessment Appearance:  Appears stated age Attitude/Demeanor/Rapport:  Lethargic Affect (typically observed):  Accepting, Appropriate, Calm, Pleasant Orientation:  Oriented to Self, Oriented to Place, Oriented to  Time, Oriented to Situation Alcohol / Substance use:  Never Used Psych involvement (Current and /or in the community):  No (Comment)  Discharge Needs  Concerns to be addressed:  Care Coordination Readmission within the last 30 days:  No Current discharge risk:  Dependent with Mobility Barriers to Discharge:  No Barriers Identified   [name], LCSW 01/21/2016, 12:22 PM

## 2016-01-21 NOTE — Clinical Social Work Placement (Signed)
   CLINICAL SOCIAL WORK PLACEMENT  NOTE  Date:  01/21/2016  Patient Details  Name: [name] MAUSOLF Sr. MRN: [id number] Date of Birth: 11-21-1935  Clinical Social Work is seeking post-discharge placement for this patient at the [place] level of care (*CSW will initial, date and re-position this form in  chart as items are completed):  Yes   Patient/family provided with [facility] Work Department's list of facilities offering this level of care within the geographic area requested by the patient (or if unable, by the patient's family).  Yes   Patient/family informed of their freedom to choose among providers that offer the needed level of care, that participate in Medicare, Medicaid or managed care program needed by the patient, have an available bed and are willing to accept the patient.  Yes   Patient/family informed of [facility]'s ownership interest in [facility] and [facility], as well as of the fact that they are under no obligation to receive care at these facilities.  PASRR submitted to EDS on 01/21/16     PASRR number received on 01/21/16     Existing PASRR number confirmed on       FL2 transmitted to all facilities in geographic area requested by pt/family on 01/21/16     FL2 transmitted to all facilities within larger geographic area on       Patient informed that his/her managed care company has contracts with or will negotiate with certain facilities, including the following:            Patient/family informed of bed offers received.  Patient chooses bed at       Physician recommends and patient chooses bed at      Patient to be transferred to   on  .  Patient to be transferred to facility by       Patient family notified on   of transfer.  Name of family member notified:        PHYSICIAN Please sign FL2     Additional Comment:    _______________________________________________ [name],  LCSW 01/21/2016, 12:25 PM

## 2016-01-21 NOTE — Progress Notes (Signed)
Patient continue to c/o severe left wrist pain.  Has clinical effusion of wrist. TTP at wrist, but also the soft tissues on dorsum of hand.  Much increased pain with passive wrist ROM.  7-10 wrist aspirate Gram stain--polys with no organisms, Cultures remain neg to date, No crystals. WBC at 13.  At this point, the evidence is mixed, some of it more c/w infection, other more c/w non-infectious inflammation.  Given his severe pain, I recommend surgical exploration with irrigation and drainage of the wrist joint through open arthrotomy.  Will obtain additional deep cultures and fluid for crystal analysis.  Will likely leave drain in place.  D/w patient and family and consent obtained.

## 2016-01-21 NOTE — Anesthesia Postprocedure Evaluation (Signed)
Anesthesia Post Note  Patient: [name] KRIVANEK Sr.  Procedure(s) Performed: Procedure(s) (LRB): IRRIGATION AND DEBRIDEMENT EXTREMITY (Left)  Patient location during evaluation: PACU Anesthesia Type: General Level of consciousness: awake, awake and alert and oriented Pain management: pain level controlled Vital Signs Assessment: post-procedure vital signs reviewed and stable Respiratory status: spontaneous breathing, nonlabored ventilation and respiratory function stable Cardiovascular status: blood pressure returned to baseline    Last Vitals:  Filed Vitals:   01/21/16 2000 01/21/16 2015  BP: 163/72 153/73  Pulse: 86 84  Temp:  37.2 C  Resp: 11 15    Last Pain:  Filed Vitals:   01/21/16 2016  PainSc: [place]

## 2016-01-21 NOTE — Transfer of Care (Signed)
Immediate Anesthesia Transfer of Care Note  Patient: [name]  Procedure(s) Performed: Procedure(s): IRRIGATION AND DEBRIDEMENT EXTREMITY (Left)  Patient Location: PACU  Anesthesia Type:General  Level of Consciousness: awake, oriented and patient cooperative  Airway & Oxygen Therapy: Patient Spontanous Breathing and Patient connected to nasal cannula oxygen  Post-op Assessment: Report given to RN, Post -op Vital signs reviewed and stable and Patient moving all extremities  Post vital signs: Reviewed and stable  Last Vitals:  Filed Vitals:   01/21/16 1214 01/21/16 1657  BP: 90/48 119/64  Pulse: 96 78  Temp: 36.7 C 37.1 C  Resp:  20    Last Pain:  Filed Vitals:   01/21/16 1658  PainSc: Asleep      Patients Stated Pain Goal:  (hurts worse when patient moves arm) ([id number])  Complications: No apparent anesthesia complications

## 2016-01-21 NOTE — NC FL2 (Signed)
[place] LEVEL OF CARE SCREENING TOOL     IDENTIFICATION  Patient Name: [name] HOLLYFIELD Sr. Birthdate: [birth year] Sex: male Admission Date (Current Location): 01/16/2016  [facility] [place] Number:  Herbalist and Address:  [facility], [place] [address] [place], [place] [address]      Provider Number: [id number]  Attending Physician Name and Address:  Bonnell Public, MD  Relative Name and Phone Number:       Current Level of Care: Hospital Recommended Level of Care: [place] Prior Approval Number:    Date Approved/Denied:   PASRR Number: [id number]  Discharge Plan: SNF    Current Diagnoses: Patient Active Problem List   Diagnosis Date Noted  . Hemoptysis   . Pain   . Wrist swelling   . Staphylococcus aureus bacteremia 01/17/2016  . CAP (community acquired pneumonia) 01/16/2016  . Pneumonia 01/16/2016  . S/P hernia repair 10/28/2015  . Right inguinal hernia 09/09/2015  . Inguinal hernia 08/29/2015  . GIB (gastrointestinal bleeding) 08/29/2015  . Hematuria 05/19/2015  . Thrombocytopenia ([place]) 11/11/2014  . Leukopenia 11/11/2014  . B12 deficiency 08/31/2013  . Sick sinus syndrome ([place]) 08/26/2013  . Pacemaker 08/26/2013  . On amiodarone therapy 08/21/2013  . Gout due to renal impairment, left ankle and foot 06/13/2013    Class: Acute  . CAD of autologous bypass graft 06/05/2013  . MDS (myelodysplastic syndrome) ([place]) 05/22/2013  . Anemia of chronic disease 03/19/2013  . CKD (chronic kidney disease), stage IV ([place]) 03/18/2013  . NSVT (nonsustained ventricular tachycardia) ([place]) 03/18/2013  . Chronic diastolic heart failure ([place]) 03/17/2013  . Diabetes mellitus ([place]) 03/17/2013  . Essential hypertension 03/17/2013  . Mesenteric artery stenosis ([place]) 05/01/2012  . PAF (paroxysmal atrial fibrillation) ([place]) 12/24/2011  . PAD (peripheral artery disease) ([place]) 12/24/2011  . Chronic vascular  insufficiency of intestine ([facility]) 12/20/2011    Orientation RESPIRATION BLADDER Height & Weight     Self, Time, Situation, Place  O2 (Nasal Canula 2 L) Continent Weight: 154 lb (69.854 kg) Height:  5\' 10"  (177.8 cm)  BEHAVIORAL SYMPTOMS/MOOD NEUROLOGICAL BOWEL NUTRITION STATUS   (None)  (None) Continent Diet (Currently NPO for surgery. No diet order prior for this admission.)  AMBULATORY STATUS COMMUNICATION OF NEEDS Skin   Extensive Assist Verbally Normal                       Personal Care Assistance Level of Assistance  Bathing, Dressing Bathing Assistance: Limited assistance Feeding assistance: Independent Dressing Assistance: Limited assistance     Functional Limitations Info  Sight, Hearing, Speech Sight Info: Adequate Hearing Info: Adequate Speech Info: Adequate    SPECIAL CARE FACTORS FREQUENCY  Blood pressure, Diabetic urine testing, PT (By licensed PT)     PT Frequency: 5 x week              Contractures Contractures Info: Not present    Additional Factors Info  Code Status, Allergies Code Status Info: Full Allergies Info: Nsaids, Ibuprofen, Ace inhibitors           Current Medications (01/21/2016):  This is the current hospital active medication list Current Facility-Administered Medications  Medication Dose Route Frequency Provider Last Rate Last Dose  . acetaminophen (TYLENOL) tablet 650 mg  650 mg Oral Q6H PRN Dionne Milo, NP   650 mg at 01/21/16 1037  . amiodarone (PACERONE) tablet 100 mg  100 mg Oral Daily Bonnell Public,  MD   100 mg at 01/21/16 1038  . atorvastatin (LIPITOR) tablet 80 mg  80 mg Oral QHS Bonnell Public, MD   80 mg at 01/20/16 2230  . Calcifediol ER CPCR 30 mcg  30 mcg Oral QHS Bonnell Public, MD   30 mcg at 01/16/16 2200  . carvedilol (COREG) tablet 25 mg  25 mg Oral BID WC Bonnell Public, MD   25 mg at 01/21/16 1037  . ceFAZolin (ANCEF) IVPB 2g/100 mL premix  2 g Intravenous Q12H Bonnell Public,  MD   2 g at 01/21/16 0815  . cloNIDine (CATAPRES) tablet 0.1 mg  0.1 mg Oral BID Bonnell Public, MD   0.1 mg at 01/21/16 1037  . colchicine tablet 0.6 mg  0.6 mg Oral Daily Verlee Monte, MD   0.6 mg at 01/21/16 1038  . hydrALAZINE (APRESOLINE) tablet 50 mg  50 mg Oral TID Bonnell Public, MD   50 mg at 01/21/16 1037  . isosorbide mononitrate (IMDUR) 24 hr tablet 120 mg  120 mg Oral Daily Bonnell Public, MD   120 mg at 01/21/16 1037  . multivitamin with minerals tablet 1 tablet  1 tablet Oral q morning - 10a Bonnell Public, MD   1 tablet at 01/20/16 1000  . rifampin (RIFADIN) capsule 600 mg  600 mg Oral Daily Michel Bickers, MD   600 mg at 01/21/16 1049  . tamsulosin (FLOMAX) capsule 0.4 mg  0.4 mg Oral QPC supper Bonnell Public, MD   0.4 mg at 01/20/16 1741     Discharge Medications: Please see discharge summary for a list of discharge medications.  Relevant Imaging Results:  Relevant Lab Results:   Additional Information SS#: [id number]  [name], LCSW

## 2016-01-21 NOTE — Progress Notes (Signed)
Patient and family was not comfortable signing surgical consent. Patient and family stated that they had not been informed that for certain there would be surgical intervention needed. They would like to speak to the doctor or someone from the team before signing consent.

## 2016-01-21 NOTE — Evaluation (Signed)
Physical Therapy Evaluation Patient Details Name: [name] MANETTA Sr. 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  Has CAP, hematuria, L wrist and ankle pain with gout history.  PMHx:  SSS, pacemaker, CKD, PAF,   Clinical Impression  Pt is up to chair with 2 person assist and will likely need platform walker to succeed with L wrist pain and redness.  He is motivated to move but in tremendous pain esp L wrist and has a non fitting brace on the hand.  Pt is appropriate to follow acutely and work on moving with walker as tolerated.    Follow Up Recommendations SNF    Equipment Recommendations  None recommended by PT    Recommendations for Other Services Rehab consult     Precautions / Restrictions Precautions Precautions: ICD/Pacemaker (telemetry) Restrictions Weight Bearing Restrictions: No      Mobility  Bed Mobility Overal bed mobility: Needs Assistance Bed Mobility: Supine to Sit     Supine to sit: Mod assist;+2 for physical assistance;+2 for safety/equipment;HOB elevated     General bed mobility comments: Pt cannot use LUE at all so used bed pad to lift trunk  Transfers Overall transfer level: Needs assistance Equipment used: 2 person hand held assist Transfers: Sit to/from [facility] Sit to Stand: Mod assist;+2 physical assistance;+2 safety/equipment;From elevated surface Stand pivot transfers: Mod assist;+2 physical assistance;+2 safety/equipment;From elevated surface       General transfer comment: used gait belt and assisted under trunk along with L upper arm and R hand  Ambulation/Gait             General Gait Details: unable to do more than sidestep to chair  Stairs            Wheelchair Mobility    Modified Rankin (Stroke Patients Only)       Balance  Overall balance assessment: Needs assistance Sitting-balance support: Feet supported Sitting balance-Leahy Scale: Fair   Postural control: Posterior lean Standing balance support: Bilateral upper extremity supported Standing balance-Leahy Scale: Poor                               Pertinent Vitals/Pain Pain Assessment: Faces Pain Score: 10-Worst pain ever Faces Pain Scale: Hurts worst Pain Location: L wrist in splint Pain Descriptors / Indicators: Aching;Tender Pain Intervention(s): Limited activity within patient's tolerance;Premedicated before session;Repositioned;Patient requesting pain meds-RN notified;Monitored during session    Home Living Family/patient expects to be discharged to:: Private residence Living Arrangements: Spouse/significant other Available Help at Discharge: Family;Friend(s);Available PRN/intermittently Type of Home: House         Home Equipment: Walker - 2 wheels;Cane - single point      Prior Function Level of Independence: Independent with assistive device(s)         Comments: uses devices based on his need     Hand Dominance        Extremity/Trunk Assessment   Upper Extremity Assessment: Generalized weakness;LUE deficits/detail       LUE Deficits / Details: L wrist pain with splint   Lower Extremity Assessment: Generalized weakness;LLE deficits/detail   LLE Deficits / Details: L ankle pain with difficulty standing on it  Cervical / Trunk Assessment: Normal  Communication   Communication: No difficulties  Cognition Arousal/Alertness: Awake/alert Behavior During Therapy: Anxious Overall  Cognitive Status: Within Functional Limits for tasks assessed                      General Comments General comments (skin integrity, edema, etc.): Pt is in such pain with LUE and L ankle that he could only get to sidestep to chair with PT and rehab aide.  He is motivated to try and will possibly be going for TEE procedure  today    Exercises        Assessment/Plan    PT Assessment Patient needs continued PT services  PT Diagnosis Generalized weakness;Difficulty walking;Acute pain   PT Problem List Decreased strength;Decreased activity tolerance;Decreased range of motion;Decreased balance;Decreased mobility;Decreased coordination;Cardiopulmonary status limiting activity;Decreased skin integrity;Pain  PT Treatment Interventions DME instruction;Gait training;Functional mobility training;Therapeutic activities;Therapeutic exercise;Balance training;Neuromuscular re-education;Patient/family education   PT Goals (Current goals can be found in the Care Plan section) Acute Rehab PT Goals Patient Stated Goal: to hurt less PT Goal Formulation: With patient Time For Goal Achievement: 02/04/16 Potential to Achieve Goals: Good    Frequency Min 2X/week   Barriers to discharge Decreased caregiver support Pt will need two people and substantial help with pain management to progress gait    Co-evaluation               End of Session Equipment Utilized During Treatment: Gait belt;Oxygen Activity Tolerance: Patient tolerated treatment well;Patient limited by pain Patient left: in chair;with call bell/phone within reach;with chair alarm set Nurse Communication: Mobility status         Time: [id number] PT Time Calculation (min) (ACUTE ONLY): 21 min   Charges:   PT Evaluation $PT Eval Moderate Complexity: 1 Procedure     PT G CodesRamond Dial 2016/02/10, 11:11 AM    Mee Hives, PT MS Acute Rehab Dept. Number: [place] and [place]

## 2016-01-21 NOTE — Progress Notes (Signed)
PROGRESS NOTE  [name]  [id number] DOB: [birth year] DOA: 01/16/2016 PCP: [name], MD Outpatient Specialists:  Subjective:  No new complaints. No worsening left wrist pain. No SOB. No chest pain.   Brief Narrative:  80 year old AAM with past medical history of SSS S/P PPM, not controlled diabetes, history of prostate cancer came in with multifocal pneumonia, hemoptysis and MSSA bacteremia  Assessment & Plan:   Principal Problem:   Staphylococcus aureus bacteremia Active Problems:   PAF (paroxysmal atrial fibrillation) (HCC)   CKD (chronic kidney disease), stage IV (HCC)   Pacemaker   CAP (community acquired pneumonia)   Pneumonia   Hemoptysis   Pain   Wrist swelling   Staphylococcus aureus bacteremia - ID input is appreciated. For possible removal of pacemaker and TEE. -Patient presented with multifocal pneumonia likely the cause of the staph aureus bacteremia. -Per [facility] it's MSSA, started on cefazolin and rifampin. -2-D echocardiogram showed 78 mobile mass at the pacemaker wire.  Community acquired pneumonia -Multifocal pneumonia, initially started on Rocephin and vancomycin switched to cefazolin. -Supportive management with bronchodilators, mucolytics, antitussives and oxygen as needed.  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 Megan Salon. -TEE could be done, but doubt it's going to change management as there is no mention of big abscess by TTE. -Cardiology consulted for further recommendation,? Pacemaker removal, Dr. Lovena Le evaluated the a.m I'll keep him NPO .  Left wrist pain -Has swelling, warmth and tenderness, in the background of MSSA bacteremia we have to rule out septic arthritis. -Evaluated by hand surgery, recommended to continue antibiotics started colchicine. -This is could  be acute gout attack versus septic arthritis, but more likely acute gout, gram stain is negative. -Patient started on colchicine. He also had his right ankle now hurting.  Hemoptysis -Significant hemoptysis, patient is on aspirin, Plavix and CT without contrast showed multifocal pneumonia. -D-dimer elevated at 2.3, likely secondary to the pneumonia. -Unlikely to be PE, as he does not have tachycardia or hypoxia.  Hematuria -Reportedly gross hematuria, ultrasound done showed blood clot versus tumor in the bladder. -Likely will need urology to evaluate, hopefully after 1 or 2 days of antibiotics.  SSS status post PPM -2-D echocardiogram showed 87 mm mobile mass in the right atrium attached to the pacer wire. -This is likely represent endocarditis. Cardiology to evaluate.  CKD stage IV -Creatinine at baseline of 3.8  Hypokalemia -Replete with oral supplements.   DVT prophylaxis:  Code Status: Full Code Family Communication:  Disposition Plan:  Diet: Diet NPO time specified Except for: Sips with Meds Diet NPO time specified Except for: Sips with Meds  Consultants:   ID  Procedures:   Echo, pending  Antimicrobials:   Cefazolin   Objective: Filed Vitals:   01/20/16 2016 01/21/16 0342 01/21/16 1037 01/21/16 1214  BP: 160/68 161/71 120/78 90/48  Pulse: 78 84  96  Temp: 99.2 F (37.3 C) 98.9 F (37.2 C)  98 F (36.7 C)  TempSrc: Oral Oral  Oral  Resp: 20 21    Height:      Weight:  69.854 kg (154 lb)    SpO2: 96% 100%  97%    Intake/Output Summary (Last 24 hours) at 01/21/16 1321 Last data filed at 01/21/16 1216  Gross per 24 hour  Intake   1220 ml  Output    251 ml  Net  969 ml   Filed Weights   01/18/16 0711 01/20/16 0455 01/21/16 0342  Weight: 66.769 kg (147 lb 3.2 oz) 65.635 kg (144 lb 11.2 oz) 69.854 kg (154 lb)    Examination: General exam: Appears calm and comfortable  Respiratory system: Clear to auscultation. Respiratory effort  normal. Cardiovascular system: S1 & S2 heard, RRR. No JVD, murmurs, rubs, gallops or clicks. No pedal edema. Gastrointestinal system: Abdomen is nondistended, soft and nontender. No organomegaly or masses felt. Normal bowel sounds heard. Central nervous system: Alert and oriented. No focal neurological deficits. Extremities: Symmetric 5 x 5 power. Skin: No rashes, lesions or ulcers Psychiatry: Judgement and insight appear normal. Mood & affect appropriate.   Data Reviewed: I have personally reviewed following labs and imaging studies  CBC:  Recent Labs Lab 01/16/16 1239 01/16/16 1801 01/18/16 0923 01/19/16 0359  WBC 13.1*  --  15.3* 13.3*  NEUTROABS 11.0*  --   --   --   HGB 9.7*  --  9.2* 8.8*  HCT 30.4*  --  28.7* 26.5*  MCV 102.4*  --  101.1* 97.8  PLT 100* 95* 108* 0000000*   Basic Metabolic Panel:  Recent Labs Lab 01/16/16 1239 01/18/16 0923 01/19/16 0359 01/20/16 0309 01/21/16 0314  NA 139 134* 134* 135 132*  K 3.5 2.9* 3.6 3.2* 4.3  CL 105 100* 103 100* 100*  CO2 21* 22 22 23 24   GLUCOSE 141* 222* 135* 117* 128*  BUN 67* 86* 84* 73* 71*  CREATININE 3.48* 3.84* 3.60* 3.02* 3.06*  CALCIUM 9.5 8.8* 9.0 9.2 9.0   GFR: Estimated Creatinine Clearance: 19.7 mL/min (by C-G formula based on Cr of 3.06). Liver Function Tests:  Recent Labs Lab 01/16/16 1239  AST 30  ALT 12*  ALKPHOS 27*  BILITOT 0.7  PROT 7.6  ALBUMIN 4.2   No results for input(s): LIPASE, AMYLASE in the last 168 hours. No results for input(s): AMMONIA in the last 168 hours. Coagulation Profile:  Recent Labs Lab 01/16/16 1801 01/17/16 0424  INR 1.27 1.28   Cardiac Enzymes: No results for input(s): CKTOTAL, CKMB, CKMBINDEX, TROPONINI in the last 168 hours. BNP (last 3 results) No results for input(s): PROBNP in the last 8760 hours. HbA1C: No results for input(s): HGBA1C in the last 72 hours. CBG:  Recent Labs Lab 01/18/16 0634  GLUCAP 132*   Lipid Profile: No results for  input(s): CHOL, HDL, LDLCALC, TRIG, CHOLHDL, LDLDIRECT in the last 72 hours. Thyroid Function Tests: No results for input(s): TSH, T4TOTAL, FREET4, T3FREE, THYROIDAB in the last 72 hours. Anemia Panel: No results for input(s): VITAMINB12, FOLATE, FERRITIN, TIBC, IRON, RETICCTPCT in the last 72 hours. Urine analysis:    Component Value Date/Time   COLORURINE YELLOW 01/16/2016 1419   APPEARANCEUR CLOUDY* 01/16/2016 1419   LABSPEC 1.015 01/16/2016 1419   LABSPEC 1.010 05/19/2015 1231   PHURINE 5.5 01/16/2016 1419   PHURINE 6.0 05/19/2015 1231   GLUCOSEU NEGATIVE 01/16/2016 1419   GLUCOSEU Negative 05/19/2015 1231   HGBUR LARGE* 01/16/2016 1419   HGBUR Negative 05/19/2015 1231   BILIRUBINUR NEGATIVE 01/16/2016 1419   BILIRUBINUR Negative 05/19/2015 1231   KETONESUR NEGATIVE 01/16/2016 1419   KETONESUR Negative 05/19/2015 1231   PROTEINUR 100* 01/16/2016 1419   PROTEINUR < 30 05/19/2015 1231   UROBILINOGEN 0.2 05/19/2015 1231   UROBILINOGEN 0.2 04/03/2014 1911   NITRITE NEGATIVE 01/16/2016 1419   NITRITE Negative 05/19/2015 1231   LEUKOCYTESUR NEGATIVE 01/16/2016 1419   LEUKOCYTESUR Negative 05/19/2015 1231   Sepsis  Labs:  Recent Results (from the past 240 hour(s))  Blood culture (routine x 2)     Status: Abnormal   Collection Time: 01/16/16  1:00 PM  Result Value Ref Range Status   Specimen Description LEFT ANTECUBITAL  Final   Special Requests BOTTLES DRAWN AEROBIC AND ANAEROBIC 10CC  Final   Culture  Setup Time   Final    GRAM POSITIVE COCCI IN CLUSTERS IN BOTH AEROBIC AND ANAEROBIC BOTTLES CRITICAL RESULT CALLED TO, READ BACK BY AND VERIFIED WITH: J MARKLE 01/17/16 @ 0927 M VESTAL    Culture (A)  Final    STAPHYLOCOCCUS AUREUS SUSCEPTIBILITIES PERFORMED ON PREVIOUS CULTURE WITHIN THE LAST 5 DAYS.    Report Status 01/19/2016 FINAL  Final  Blood culture (routine x 2)     Status: Abnormal   Collection Time: 01/16/16  1:06 PM  Result Value Ref Range Status   Specimen  Description BLOOD LEFT HAND  Final   Special Requests AEROBIC BOTTLE ONLY 10CC  Final   Culture  Setup Time   Final    GRAM POSITIVE COCCI IN CLUSTERS AEROBIC BOTTLE ONLY CRITICAL RESULT CALLED TO, READ BACK BY AND VERIFIED WITH: J MARKLE 01/17/16 @ 0927 M VESTAL    Culture STAPHYLOCOCCUS AUREUS (A)  Final   Report Status 01/19/2016 FINAL  Final   Organism ID, Bacteria STAPHYLOCOCCUS AUREUS  Final      Susceptibility   Staphylococcus aureus - MIC*    CIPROFLOXACIN >=8 RESISTANT Resistant     ERYTHROMYCIN <=0.25 SENSITIVE Sensitive     GENTAMICIN <=0.5 SENSITIVE Sensitive     OXACILLIN 0.5 SENSITIVE Sensitive     TETRACYCLINE <=1 SENSITIVE Sensitive     VANCOMYCIN <=0.5 SENSITIVE Sensitive     TRIMETH/SULFA <=10 SENSITIVE Sensitive     CLINDAMYCIN <=0.25 SENSITIVE Sensitive     RIFAMPIN <=0.5 SENSITIVE Sensitive     Inducible Clindamycin NEGATIVE Sensitive     * STAPHYLOCOCCUS AUREUS  Blood Culture ID Panel (Reflexed)     Status: Abnormal   Collection Time: 01/16/16  1:06 PM  Result Value Ref Range Status   Enterococcus species NOT DETECTED NOT DETECTED Final   Vancomycin resistance NOT DETECTED NOT DETECTED Final   Listeria monocytogenes NOT DETECTED NOT DETECTED Final   Staphylococcus species DETECTED (A) NOT DETECTED Final    Comment: CRITICAL RESULT CALLED TO, READ BACK BY AND VERIFIED WITH: J MARKLE 01/17/16 @ 0927 M VESTAL    Staphylococcus aureus DETECTED (A) NOT DETECTED Final    Comment: CRITICAL RESULT CALLED TO, READ BACK BY AND VERIFIED WITH: J MARKLE 01/17/16 @ 0927 M VESTAL    Methicillin resistance NOT DETECTED NOT DETECTED Final   Streptococcus species NOT DETECTED NOT DETECTED Final   Streptococcus agalactiae NOT DETECTED NOT DETECTED Final   Streptococcus pneumoniae NOT DETECTED NOT DETECTED Final   Streptococcus pyogenes NOT DETECTED NOT DETECTED Final   Acinetobacter baumannii NOT DETECTED NOT DETECTED Final   Enterobacteriaceae species NOT DETECTED NOT  DETECTED Final   Enterobacter cloacae complex NOT DETECTED NOT DETECTED Final   Escherichia coli NOT DETECTED NOT DETECTED Final   Klebsiella oxytoca NOT DETECTED NOT DETECTED Final   Klebsiella pneumoniae NOT DETECTED NOT DETECTED Final   Proteus species NOT DETECTED NOT DETECTED Final   Serratia marcescens NOT DETECTED NOT DETECTED Final   Carbapenem resistance NOT DETECTED NOT DETECTED Final   Haemophilus influenzae NOT DETECTED NOT DETECTED Final   Neisseria meningitidis NOT DETECTED NOT DETECTED Final   Pseudomonas aeruginosa NOT DETECTED  NOT DETECTED Final   Candida albicans NOT DETECTED NOT DETECTED Final   Candida glabrata NOT DETECTED NOT DETECTED Final   Candida krusei NOT DETECTED NOT DETECTED Final   Candida parapsilosis NOT DETECTED NOT DETECTED Final   Candida tropicalis NOT DETECTED NOT DETECTED Final  Culture, sputum-assessment     Status: None   Collection Time: 01/16/16 10:24 PM  Result Value Ref Range Status   Specimen Description EXPECTORATED SPUTUM  Final   Special Requests NONE  Final   Sputum evaluation   Final    THIS SPECIMEN IS ACCEPTABLE. RESPIRATORY CULTURE REPORT TO FOLLOW.   Report Status 01/17/2016 FINAL  Final  Culture, respiratory (NON-Expectorated)     Status: None   Collection Time: 01/16/16 10:24 PM  Result Value Ref Range Status   Specimen Description EXPECTORATED SPUTUM  Final   Special Requests NONE  Final   Gram Stain   Final    RARE SQUAMOUS EPITHELIAL CELLS PRESENT ABUNDANT WBC PRESENT,BOTH PMN AND MONONUCLEAR FEW GRAM POSITIVE COCCI IN PAIRS IN CLUSTERS    Culture Consistent with normal respiratory flora.  Final   Report Status 01/19/2016 FINAL  Final  Culture, blood (routine x 2)     Status: None (Preliminary result)   Collection Time: 01/17/16  9:50 AM  Result Value Ref Range Status   Specimen Description BLOOD BLOOD LEFT ARM  Final   Special Requests BOTTLES DRAWN AEROBIC ONLY 5CC  Final   Culture NO GROWTH 3 DAYS  Final   Report  Status PENDING  Incomplete  Culture, blood (routine x 2)     Status: None (Preliminary result)   Collection Time: 01/17/16  9:50 AM  Result Value Ref Range Status   Specimen Description BLOOD LEFT ANTECUBITAL  Final   Special Requests BOTTLES DRAWN AEROBIC ONLY 5CC  Final   Culture NO GROWTH 3 DAYS  Final   Report Status PENDING  Incomplete  Body fluid culture     Status: None (Preliminary result)   Collection Time: 01/19/16  4:37 PM  Result Value Ref Range Status   Specimen Description FLUID SYNOVIAL WRIST LEFT  Final   Special Requests NONE  Final   Gram Stain   Final    ABUNDANT WBC PRESENT,BOTH PMN AND MONONUCLEAR NO ORGANISMS SEEN    Culture NO GROWTH 2 DAYS  Final   Report Status PENDING  Incomplete     Invalid input(s): PROCALCITONIN, LACTICACIDVEN   Radiology Studies: Dg Fluoro Guided Needle Plc Aspiration/injection Loc  01/21/2016  CLINICAL DATA:  Left wrist pain and swelling.  Sepsis. EXAM: LEFT WRIST ASPIRATION UNDER FLUOROSCOPY FLUOROSCOPY TIME:  Fluoroscopy Time (in minutes and seconds): 24 SECONDS PROCEDURE: Overlying skin prepped with Betadine, draped in the usual sterile fashion, and infiltrated locally with buffered Lidocaine. 20 gauge needle advanced into the radiocarpal joint under direct fluoroscopic visualization. Approximately 3 cc of bloody fluid was aspirated from the joint and overlying soft tissues. IMPRESSION: Left radiocarpal joint aspiration performed without immediate complication. Mr. Ballor with tolerated the procedure well. Technically successful  hip injection under fluoroscopy. Electronically Signed   By: Lorriane Shire M.D.   On: 01/21/2016 08:10        Scheduled Meds: . amiodarone  100 mg Oral Daily  . atorvastatin  80 mg Oral QHS  . Calcifediol ER  30 mcg Oral QHS  . carvedilol  25 mg Oral BID WC  .  ceFAZolin (ANCEF) IV  2 g Intravenous Q12H  . cloNIDine  0.1 mg Oral BID  .  colchicine  0.6 mg Oral Daily  . hydrALAZINE  50 mg Oral TID   . isosorbide mononitrate  120 mg Oral Daily  . multivitamin with minerals  1 tablet Oral q morning - 10a  . rifampin  600 mg Oral Daily  . tamsulosin  0.4 mg Oral QPC supper   Continuous Infusions:     LOS: 5 days    Time spent: 35 minutes    Bonnell Public, MD [facility] Pager [phone]  If 7PM-7AM, please contact night-coverage [email or web address] Password [email or web address] 01/21/2016, 1:21 PM

## 2016-01-21 NOTE — Progress Notes (Addendum)
Patient ID: [name] Brow., male   DOB: [birth year], 80 y.o.   MRN: [id number]         [facility]    Date of Admission:  01/16/2016           Day 6 antibiotics  Principal Problem:   Staphylococcus aureus bacteremia Active Problems:   CAP (community acquired pneumonia)   Pacemaker   PAF (paroxysmal atrial fibrillation) (HCC)   CKD (chronic kidney disease), stage IV (HCC)   Pneumonia   Hemoptysis   Pain   Wrist swelling   . amiodarone  100 mg Oral Daily  . atorvastatin  80 mg Oral QHS  . Calcifediol ER  30 mcg Oral QHS  . carvedilol  25 mg Oral BID WC  .  ceFAZolin (ANCEF) IV  2 g Intravenous Q12H  . cloNIDine  0.1 mg Oral BID  . colchicine  0.6 mg Oral Daily  . hydrALAZINE  50 mg Oral TID  . isosorbide mononitrate  120 mg Oral Daily  . multivitamin with minerals  1 tablet Oral q morning - 10a  . rifampin  600 mg Oral Daily  . tamsulosin  0.4 mg Oral QPC supper    SUBJECTIVE: He states he is not feeling any better. He is still having quite a bit of left wrist pain. However his cough has improved and he is no longer having any hemoptysis.  Review of Systems: Review of Systems  Constitutional: Negative for fever, chills and diaphoresis.  Respiratory: Positive for cough and sputum production. Negative for hemoptysis.   Cardiovascular: Negative for chest pain.  Musculoskeletal: Positive for joint pain.    Past Medical History  Diagnosis Date  . Hypertension   . Hyperlipidemia   . GERD (gastroesophageal reflux disease)   . DVT (deep venous thrombosis) ([place]) 2011    Right arm  . Atrial fibrillation ([place])   . Gout   . Depressive disorder   . Internal hemorrhoids without mention of complication   . Gastroparesis   . Osteopenia   . Ischemic colitis ([place])   . Peripheral artery disease ([place])   . Chronic diastolic heart failure ([place])   . 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 Renal artery stenosis ([place])   . CKD (chronic kidney disease)   .  Vitamin B 12 deficiency   . CAD (coronary artery disease)     s/b CABG 1994, and subsequent stents. Repeat CABG 12/2011,  . Prostate cancer ([place]) 1997    XRT and lupron  . Myelodysplastic syndrome ([place]) 05/22/2013    With low hemoglobin and platelets treated with Procrit  . Angiomyolipoma 2009    On both kidneys noted in 2009  . Sick sinus syndrome ([place])   . Peptic ulcer     S/p partial gastrectomy in 1969  . Arthritis     neck and left wrist  . History of hiatal hernia   . Diabetes mellitus type 2 with peripheral artery disease (HCC)     DIET CONTROLLED  . Pneumonia 01/16/2016    Social History  Substance Use Topics  . Smoking status: Former Smoker -- 20 years    Types: Cigarettes    Quit date: 07/12/1969  . Smokeless tobacco: Never Used  . Alcohol Use: No    Family History  Problem Relation Age of Onset  . Diabetes Mother   . Heart disease Mother     Heart Disease before age 53  . Hyperlipidemia Mother   . Hypertension Mother   .  CVA Mother 55    cause of death  . Cancer Father     stomach/liver  . Hypertension Father     possibly hypertensive  . Cancer Sister     Breast cancer  . Heart disease Daughter     Heart Disease before age 72  . Hypertension Daughter   . Heart attack Daughter   . CAD Brother   . Heart disease Brother   . Cancer Paternal Uncle     colon  . Heart disease Sister   . Diabetes Sister   . Hypertension Sister    Allergies  Allergen Reactions  . Nsaids Other (See Comments)    GI issue  . Ibuprofen Other (See Comments)    GI Issues  . Ace Inhibitors Cough    OBJECTIVE: Filed Vitals:   01/20/16 2016 01/21/16 0342 01/21/16 1037 01/21/16 1214  BP: 160/68 161/71 120/78 90/48  Pulse: 78 84  96  Temp: 99.2 F (37.3 C) 98.9 F (37.2 C)  98 F (36.7 C)  TempSrc: Oral Oral  Oral  Resp: 20 21    Height:      Weight:  154 lb (69.854 kg)    SpO2: 96% 100%  97%   Body mass index is 22.1 kg/(m^2).  Physical Exam  Constitutional: He  is oriented to person, place, and time.  He is resting quietly in bed.  Cardiovascular: Normal rate and regular rhythm.   No murmur heard. Pulmonary/Chest: Effort normal and breath sounds normal.  Breath sounds clear anteriorly. Right anterior chest pacemaker site looks good.  Musculoskeletal:  His left wrist is splinted.  Neurological: He is alert and oriented to person, place, and time.  Skin: No rash noted.  Psychiatric: Mood and affect normal.    Lab Results Lab Results  Component Value Date   WBC 13.3* 01/19/2016   HGB 8.8* 01/19/2016   HCT 26.5* 01/19/2016   MCV 97.8 01/19/2016   PLT 109* 01/19/2016    Lab Results  Component Value Date   CREATININE 3.06* 01/21/2016   BUN 71* 01/21/2016   NA 132* 01/21/2016   K 4.3 01/21/2016   CL 100* 01/21/2016   CO2 24 01/21/2016    Lab Results  Component Value Date   ALT 12* 01/16/2016   AST 30 01/16/2016   ALKPHOS 27* 01/16/2016   BILITOT 0.7 01/16/2016     Microbiology: Recent Results (from the past 240 hour(s))  Blood culture (routine x 2)     Status: Abnormal   Collection Time: 01/16/16  1:00 PM  Result Value Ref Range Status   Specimen Description LEFT ANTECUBITAL  Final   Special Requests BOTTLES DRAWN AEROBIC AND ANAEROBIC 10CC  Final   Culture  Setup Time   Final    GRAM POSITIVE COCCI IN CLUSTERS IN BOTH AEROBIC AND ANAEROBIC BOTTLES CRITICAL RESULT CALLED TO, READ BACK BY AND VERIFIED WITH: J MARKLE 01/17/16 @ 0927 M VESTAL    Culture (A)  Final    STAPHYLOCOCCUS AUREUS SUSCEPTIBILITIES PERFORMED ON PREVIOUS CULTURE WITHIN THE LAST 5 DAYS.    Report Status 01/19/2016 FINAL  Final  Blood culture (routine x 2)     Status: Abnormal   Collection Time: 01/16/16  1:06 PM  Result Value Ref Range Status   Specimen Description BLOOD LEFT HAND  Final   Special Requests AEROBIC BOTTLE ONLY 10CC  Final   Culture  Setup Time   Final    GRAM POSITIVE COCCI IN CLUSTERS AEROBIC BOTTLE ONLY CRITICAL RESULT  CALLED TO,  READ BACK BY AND VERIFIED WITH: J MARKLE 01/17/16 @ 0927 M VESTAL    Culture STAPHYLOCOCCUS AUREUS (A)  Final   Report Status 01/19/2016 FINAL  Final   Organism ID, Bacteria STAPHYLOCOCCUS AUREUS  Final      Susceptibility   Staphylococcus aureus - MIC*    CIPROFLOXACIN >=8 RESISTANT Resistant     ERYTHROMYCIN <=0.25 SENSITIVE Sensitive     GENTAMICIN <=0.5 SENSITIVE Sensitive     OXACILLIN 0.5 SENSITIVE Sensitive     TETRACYCLINE <=1 SENSITIVE Sensitive     VANCOMYCIN <=0.5 SENSITIVE Sensitive     TRIMETH/SULFA <=10 SENSITIVE Sensitive     CLINDAMYCIN <=0.25 SENSITIVE Sensitive     RIFAMPIN <=0.5 SENSITIVE Sensitive     Inducible Clindamycin NEGATIVE Sensitive     * STAPHYLOCOCCUS AUREUS  Blood Culture ID Panel (Reflexed)     Status: Abnormal   Collection Time: 01/16/16  1:06 PM  Result Value Ref Range Status   Enterococcus species NOT DETECTED NOT DETECTED Final   Vancomycin resistance NOT DETECTED NOT DETECTED Final   Listeria monocytogenes NOT DETECTED NOT DETECTED Final   Staphylococcus species DETECTED (A) NOT DETECTED Final    Comment: CRITICAL RESULT CALLED TO, READ BACK BY AND VERIFIED WITH: J MARKLE 01/17/16 @ 0927 M VESTAL    Staphylococcus aureus DETECTED (A) NOT DETECTED Final    Comment: CRITICAL RESULT CALLED TO, READ BACK BY AND VERIFIED WITH: J MARKLE 01/17/16 @ 0927 M VESTAL    Methicillin resistance NOT DETECTED NOT DETECTED Final   Streptococcus species NOT DETECTED NOT DETECTED Final   Streptococcus agalactiae NOT DETECTED NOT DETECTED Final   Streptococcus pneumoniae NOT DETECTED NOT DETECTED Final   Streptococcus pyogenes NOT DETECTED NOT DETECTED Final   Acinetobacter baumannii NOT DETECTED NOT DETECTED Final   Enterobacteriaceae species NOT DETECTED NOT DETECTED Final   Enterobacter cloacae complex NOT DETECTED NOT DETECTED Final   Escherichia coli NOT DETECTED NOT DETECTED Final   Klebsiella oxytoca NOT DETECTED NOT DETECTED Final   Klebsiella pneumoniae  NOT DETECTED NOT DETECTED Final   Proteus species NOT DETECTED NOT DETECTED Final   Serratia marcescens NOT DETECTED NOT DETECTED Final   Carbapenem resistance NOT DETECTED NOT DETECTED Final   Haemophilus influenzae NOT DETECTED NOT DETECTED Final   Neisseria meningitidis NOT DETECTED NOT DETECTED Final   Pseudomonas aeruginosa NOT DETECTED NOT DETECTED Final   Candida albicans NOT DETECTED NOT DETECTED Final   Candida glabrata NOT DETECTED NOT DETECTED Final   Candida krusei NOT DETECTED NOT DETECTED Final   Candida parapsilosis NOT DETECTED NOT DETECTED Final   Candida tropicalis NOT DETECTED NOT DETECTED Final  Culture, sputum-assessment     Status: None   Collection Time: 01/16/16 10:24 PM  Result Value Ref Range Status   Specimen Description EXPECTORATED SPUTUM  Final   Special Requests NONE  Final   Sputum evaluation   Final    THIS SPECIMEN IS ACCEPTABLE. RESPIRATORY CULTURE REPORT TO FOLLOW.   Report Status 01/17/2016 FINAL  Final  Culture, respiratory (NON-Expectorated)     Status: None   Collection Time: 01/16/16 10:24 PM  Result Value Ref Range Status   Specimen Description EXPECTORATED SPUTUM  Final   Special Requests NONE  Final   Gram Stain   Final    RARE SQUAMOUS EPITHELIAL CELLS PRESENT ABUNDANT WBC PRESENT,BOTH PMN AND MONONUCLEAR FEW GRAM POSITIVE COCCI IN PAIRS IN CLUSTERS    Culture Consistent with normal respiratory flora.  Final   Report  Status 01/19/2016 FINAL  Final  Culture, blood (routine x 2)     Status: None (Preliminary result)   Collection Time: 01/17/16  9:50 AM  Result Value Ref Range Status   Specimen Description BLOOD BLOOD LEFT ARM  Final   Special Requests BOTTLES DRAWN AEROBIC ONLY 5CC  Final   Culture NO GROWTH 3 DAYS  Final   Report Status PENDING  Incomplete  Culture, blood (routine x 2)     Status: None (Preliminary result)   Collection Time: 01/17/16  9:50 AM  Result Value Ref Range Status   Specimen Description BLOOD LEFT  ANTECUBITAL  Final   Special Requests BOTTLES DRAWN AEROBIC ONLY 5CC  Final   Culture NO GROWTH 3 DAYS  Final   Report Status PENDING  Incomplete  Body fluid culture     Status: None (Preliminary result)   Collection Time: 01/19/16  4:37 PM  Result Value Ref Range Status   Specimen Description FLUID SYNOVIAL WRIST LEFT  Final   Special Requests NONE  Final   Gram Stain   Final    ABUNDANT WBC PRESENT,BOTH PMN AND MONONUCLEAR NO ORGANISMS SEEN    Culture NO GROWTH 2 DAYS  Final   Report Status PENDING  Incomplete     ASSESSMENT: 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 I will continue cefazolin and rifampin. Repeat blood cultures are negative. Cardiology consultation for consideration of pacemaker removal has been requested. They may want to proceed with TEE first but I'm not sure that's necessary.  PLAN: 1. Continue cefazolin and rifampin 2. He will need a centrally placed IV catheter because of his chronic kidney disease 3. Cardiology evaluation  Michel Bickers, MD [facility] [place] [phone] pager   [phone] cell 01/21/2016, 1:10 PM

## 2016-01-21 NOTE — Progress Notes (Signed)
Report called to [facility] nurse in [place] regarding pt for I&D.  Karie Kirks, Therapist, sports.

## 2016-01-21 NOTE — Anesthesia Procedure Notes (Signed)
Procedure Name: LMA Insertion Date/Time: 01/21/2016 6:13 PM Performed by: Sampson Si E Pre-anesthesia Checklist: Patient identified, Emergency Drugs available, Suction available and Timeout performed Patient Re-evaluated:Patient Re-evaluated prior to inductionOxygen Delivery Method: Circle system utilized Preoxygenation: Pre-oxygenation with 100% oxygen Intubation Type: IV induction Ventilation: Mask ventilation without difficulty LMA: LMA inserted LMA Size: 4.0 Number of attempts: 1 Placement Confirmation: positive ETCO2 and breath sounds checked- equal and bilateral Tube secured with: Tape Dental Injury: Teeth and Oropharynx as per pre-operative assessment

## 2016-01-21 NOTE — Progress Notes (Signed)
Dr. Grandville Silos office notified of patient refuse to sign the consent form, MD will call/come to see patient.will continue to monitor patient.

## 2016-01-22 ENCOUNTER — Encounter (HOSPITAL_COMMUNITY): Payer: Self-pay | Admitting: Orthopedic Surgery

## 2016-01-22 LAB — CULTURE, BLOOD (ROUTINE X 2)
CULTURE: NO GROWTH
CULTURE: NO GROWTH

## 2016-01-22 LAB — SYNOVIAL CELL COUNT + DIFF, W/ CRYSTALS
Eosinophils-Synovial: 0 % (ref 0–1)
Lymphocytes-Synovial Fld: 3 % (ref 0–20)
Monocyte-Macrophage-Synovial Fluid: 9 % — ABNORMAL LOW (ref 50–90)
Neutrophil, Synovial: 88 % — ABNORMAL HIGH (ref 0–25)

## 2016-01-22 LAB — BASIC METABOLIC PANEL
Anion gap: 10 (ref 5–15)
BUN: 82 mg/dL — AB (ref 6–20)
CHLORIDE: 101 mmol/L (ref 101–111)
CO2: 20 mmol/L — AB (ref 22–32)
CREATININE: 3.51 mg/dL — AB (ref 0.61–1.24)
Calcium: 8.8 mg/dL — ABNORMAL LOW (ref 8.9–10.3)
GFR calc Af Amer: 18 mL/min — ABNORMAL LOW (ref >60)
GFR calc non Af Amer: 15 mL/min — ABNORMAL LOW (ref >60)
GLUCOSE: 94 mg/dL (ref 65–99)
Potassium: 3.9 mmol/L (ref 3.5–5.1)
Sodium: 131 mmol/L — ABNORMAL LOW (ref 135–145)

## 2016-01-22 LAB — BODY FLUID CULTURE: Culture: NO GROWTH

## 2016-01-22 NOTE — Progress Notes (Signed)
Spoke with Dr. Grandville Silos regarding patient's blood sample in [place]. Had spoken to [facility] in [place] and blood sample had clotted therefore could not precess sample as ordered.

## 2016-01-22 NOTE — Care Management Important Message (Signed)
Important Message  Patient Details  Name: [name] WICE Sr. MRN: [id number] Date of Birth: 01-06-1936   Medicare Important Message Given:  Yes    Loann Quill 01/22/2016, 8:18 AM

## 2016-01-22 NOTE — Progress Notes (Signed)
Patient ID: [name]., male   DOB: 09-15-1935, 80 y.o.   MRN: [id number]         [facility]ious [facility]    Date of Admission:  01/16/2016           Day 7 antibiotics  Principal Problem:   Staphylococcus aureus bacteremia Active Problems:   CAP (community acquired pneumonia)   Pacemaker   PAF (paroxysmal atrial fibrillation) (HCC)   CKD (chronic kidney disease), stage IV (HCC)   Pneumonia   Hemoptysis   Pain   Wrist swelling   . amiodarone  100 mg Oral Daily  . atorvastatin  80 mg Oral QHS  . Calcifediol ER  30 mcg Oral QHS  . carvedilol  25 mg Oral BID WC  .  ceFAZolin (ANCEF) IV  2 g Intravenous Q12H  . cloNIDine  0.1 mg Oral BID  . colchicine  0.6 mg Oral Daily  . hydrALAZINE  50 mg Oral TID  . isosorbide mononitrate  120 mg Oral Daily  . multivitamin with minerals  1 tablet Oral q morning - 10a  . rifampin  600 mg Oral Daily  . tamsulosin  0.4 mg Oral QPC supper    SUBJECTIVE: He underwent left wrist surgery yesterday afternoon. He still having quite a bit of wrist pain. He has had no more hemoptysis.  Review of Systems: Review of Systems  Constitutional: Negative for fever, chills and diaphoresis.  Respiratory: Positive for cough and sputum production. Negative for hemoptysis.   Cardiovascular: Negative for chest pain.  Musculoskeletal: Positive for joint pain.    Past Medical History  Diagnosis Date  . Hypertension   . Hyperlipidemia   . GERD (gastroesophageal reflux disease)   . DVT (deep venous thrombosis) ([place]) 2011    Right arm  . Atrial fibrillation ([place])   . Gout   . Depressive disorder   . Internal hemorrhoids without mention of complication   . Gastroparesis   . Osteopenia   . Ischemic colitis ([place])   . Peripheral artery disease ([place])   . Chronic diastolic heart failure ([place])   . 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 Renal artery stenosis ([place])   . CKD (chronic kidney disease)   . Vitamin B 12 deficiency   . CAD (coronary  artery disease)     s/b CABG 1994, and subsequent stents. Repeat CABG 12/2011,  . Prostate cancer ([place]) 1997    XRT and lupron  . Myelodysplastic syndrome ([place]) 05/22/2013    With low hemoglobin and platelets treated with Procrit  . Angiomyolipoma 2009    On both kidneys noted in 2009  . Sick sinus syndrome ([place])   . Peptic ulcer     S/p partial gastrectomy in 1969  . Arthritis     neck and left wrist  . History of hiatal hernia   . Diabetes mellitus type 2 with peripheral artery disease (HCC)     DIET CONTROLLED  . Pneumonia 01/16/2016  . Presence of permanent cardiac pacemaker     Social History  Substance Use Topics  . Smoking status: Former Smoker -- 20 years    Types: Cigarettes    Quit date: 07/12/1969  . Smokeless tobacco: Never Used  . Alcohol Use: No    Family History  Problem Relation Age of Onset  . Diabetes Mother   . Heart disease Mother     Heart Disease before age 58  . Hyperlipidemia Mother   . Hypertension Mother   .  CVA Mother 61    cause of death  . Cancer Father     stomach/liver  . Hypertension Father     possibly hypertensive  . Cancer Sister     Breast cancer  . Heart disease Daughter     Heart Disease before age 17  . Hypertension Daughter   . Heart attack Daughter   . CAD Brother   . Heart disease Brother   . Cancer Paternal Uncle     colon  . Heart disease Sister   . Diabetes Sister   . Hypertension Sister    Allergies  Allergen Reactions  . Nsaids Other (See Comments)    GI issue  . Ibuprofen Other (See Comments)    GI Issues  . Ace Inhibitors Cough    OBJECTIVE: Filed Vitals:   01/21/16 2113 01/22/16 0021 01/22/16 0442 01/22/16 0922  BP: 155/72 126/59 123/53 137/73  Pulse: 86 81 77 89  Temp: 98.9 F (37.2 C) 98.7 F (37.1 C) 98.6 F (37 C) 98.7 F (37.1 C)  TempSrc: Oral Oral Oral Oral  Resp: 17 18 19 18   Height:      Weight:   161 lb 6.4 oz (73.211 kg)   SpO2: 98% 100% 100% 98%   Body mass index is 23.16  kg/(m^2).  Physical Exam  Constitutional: He is oriented to person, place, and time.  He is resting quietly in bed.  Cardiovascular: Normal rate and regular rhythm.   No murmur heard. Pulmonary/Chest: Effort normal and breath sounds normal.  Breath sounds clear anteriorly. Right anterior chest pacemaker site looks good.  Musculoskeletal:  His left wrist is is wrapped and elevated.  Neurological: He is alert and oriented to person, place, and time.  Skin: No rash noted.  Psychiatric: Mood and affect normal.    Lab Results Lab Results  Component Value Date   WBC 13.3* 01/19/2016   HGB 8.8* 01/19/2016   HCT 26.5* 01/19/2016   MCV 97.8 01/19/2016   PLT 109* 01/19/2016    Lab Results  Component Value Date   CREATININE 3.51* 01/22/2016   BUN 82* 01/22/2016   NA 131* 01/22/2016   K 3.9 01/22/2016   CL 101 01/22/2016   CO2 20* 01/22/2016    Lab Results  Component Value Date   ALT 12* 01/16/2016   AST 30 01/16/2016   ALKPHOS 27* 01/16/2016   BILITOT 0.7 01/16/2016     Microbiology: Recent Results (from the past 240 hour(s))  Blood culture (routine x 2)     Status: Abnormal   Collection Time: 01/16/16  1:00 PM  Result Value Ref Range Status   Specimen Description LEFT ANTECUBITAL  Final   Special Requests BOTTLES DRAWN AEROBIC AND ANAEROBIC 10CC  Final   Culture  Setup Time   Final    GRAM POSITIVE COCCI IN CLUSTERS IN BOTH AEROBIC AND ANAEROBIC BOTTLES CRITICAL RESULT CALLED TO, READ BACK BY AND VERIFIED WITH: J MARKLE 01/17/16 @ 0927 M VESTAL    Culture (A)  Final    STAPHYLOCOCCUS AUREUS SUSCEPTIBILITIES PERFORMED ON PREVIOUS CULTURE WITHIN THE LAST 5 DAYS.    Report Status 01/19/2016 FINAL  Final  Blood culture (routine x 2)     Status: Abnormal   Collection Time: 01/16/16  1:06 PM  Result Value Ref Range Status   Specimen Description BLOOD LEFT HAND  Final   Special Requests AEROBIC BOTTLE ONLY 10CC  Final   Culture  Setup Time   Final    GRAM POSITIVE  COCCI  IN CLUSTERS AEROBIC BOTTLE ONLY CRITICAL RESULT CALLED TO, READ BACK BY AND VERIFIED WITH: J MARKLE 01/17/16 @ 0927 M VESTAL    Culture STAPHYLOCOCCUS AUREUS (A)  Final   Report Status 01/19/2016 FINAL  Final   Organism ID, Bacteria STAPHYLOCOCCUS AUREUS  Final      Susceptibility   Staphylococcus aureus - MIC*    CIPROFLOXACIN >=8 RESISTANT Resistant     ERYTHROMYCIN <=0.25 SENSITIVE Sensitive     GENTAMICIN <=0.5 SENSITIVE Sensitive     OXACILLIN 0.5 SENSITIVE Sensitive     TETRACYCLINE <=1 SENSITIVE Sensitive     VANCOMYCIN <=0.5 SENSITIVE Sensitive     TRIMETH/SULFA <=10 SENSITIVE Sensitive     CLINDAMYCIN <=0.25 SENSITIVE Sensitive     RIFAMPIN <=0.5 SENSITIVE Sensitive     Inducible Clindamycin NEGATIVE Sensitive     * STAPHYLOCOCCUS AUREUS  Blood Culture ID Panel (Reflexed)     Status: Abnormal   Collection Time: 01/16/16  1:06 PM  Result Value Ref Range Status   Enterococcus species NOT DETECTED NOT DETECTED Final   Vancomycin resistance NOT DETECTED NOT DETECTED Final   Listeria monocytogenes NOT DETECTED NOT DETECTED Final   Staphylococcus species DETECTED (A) NOT DETECTED Final    Comment: CRITICAL RESULT CALLED TO, READ BACK BY AND VERIFIED WITH: J MARKLE 01/17/16 @ 0927 M VESTAL    Staphylococcus aureus DETECTED (A) NOT DETECTED Final    Comment: CRITICAL RESULT CALLED TO, READ BACK BY AND VERIFIED WITH: J MARKLE 01/17/16 @ 0927 M VESTAL    Methicillin resistance NOT DETECTED NOT DETECTED Final   Streptococcus species NOT DETECTED NOT DETECTED Final   Streptococcus agalactiae NOT DETECTED NOT DETECTED Final   Streptococcus pneumoniae NOT DETECTED NOT DETECTED Final   Streptococcus pyogenes NOT DETECTED NOT DETECTED Final   Acinetobacter baumannii NOT DETECTED NOT DETECTED Final   Enterobacteriaceae species NOT DETECTED NOT DETECTED Final   Enterobacter cloacae complex NOT DETECTED NOT DETECTED Final   Escherichia coli NOT DETECTED NOT DETECTED Final   Klebsiella  oxytoca NOT DETECTED NOT DETECTED Final   Klebsiella pneumoniae NOT DETECTED NOT DETECTED Final   Proteus species NOT DETECTED NOT DETECTED Final   Serratia marcescens NOT DETECTED NOT DETECTED Final   Carbapenem resistance NOT DETECTED NOT DETECTED Final   Haemophilus influenzae NOT DETECTED NOT DETECTED Final   Neisseria meningitidis NOT DETECTED NOT DETECTED Final   Pseudomonas aeruginosa NOT DETECTED NOT DETECTED Final   Candida albicans NOT DETECTED NOT DETECTED Final   Candida glabrata NOT DETECTED NOT DETECTED Final   Candida krusei NOT DETECTED NOT DETECTED Final   Candida parapsilosis NOT DETECTED NOT DETECTED Final   Candida tropicalis NOT DETECTED NOT DETECTED Final  Culture, sputum-assessment     Status: None   Collection Time: 01/16/16 10:24 PM  Result Value Ref Range Status   Specimen Description EXPECTORATED SPUTUM  Final   Special Requests NONE  Final   Sputum evaluation   Final    THIS SPECIMEN IS ACCEPTABLE. RESPIRATORY CULTURE REPORT TO FOLLOW.   Report Status 01/17/2016 FINAL  Final  Culture, respiratory (NON-Expectorated)     Status: None   Collection Time: 01/16/16 10:24 PM  Result Value Ref Range Status   Specimen Description EXPECTORATED SPUTUM  Final   Special Requests NONE  Final   Gram Stain   Final    RARE SQUAMOUS EPITHELIAL CELLS PRESENT ABUNDANT WBC PRESENT,BOTH PMN AND MONONUCLEAR FEW GRAM POSITIVE COCCI IN PAIRS IN CLUSTERS    Culture Consistent with  normal respiratory flora.  Final   Report Status 01/19/2016 FINAL  Final  Culture, blood (routine x 2)     Status: None (Preliminary result)   Collection Time: 01/17/16  9:50 AM  Result Value Ref Range Status   Specimen Description BLOOD BLOOD LEFT ARM  Final   Special Requests BOTTLES DRAWN AEROBIC ONLY 5CC  Final   Culture NO GROWTH 4 DAYS  Final   Report Status PENDING  Incomplete  Culture, blood (routine x 2)     Status: None (Preliminary result)   Collection Time: 01/17/16  9:50 AM  Result  Value Ref Range Status   Specimen Description BLOOD LEFT ANTECUBITAL  Final   Special Requests BOTTLES DRAWN AEROBIC ONLY 5CC  Final   Culture NO GROWTH 4 DAYS  Final   Report Status PENDING  Incomplete  Acid Fast Smear (AFB)     Status: None   Collection Time: 01/17/16 11:26 AM  Result Value Ref Range Status   AFB Specimen Processing Concentration  Final   Acid Fast Smear Negative  Final    Comment: (NOTE) Performed At: [facility] [place], [place] [id number] Lindon Romp MD [id number]    Source (AFB) SPUTUM  Final  Body fluid culture     Status: None (Preliminary result)   Collection Time: 01/19/16  4:37 PM  Result Value Ref Range Status   Specimen Description FLUID SYNOVIAL WRIST LEFT  Final   Special Requests NONE  Final   Gram Stain   Final    ABUNDANT WBC PRESENT,BOTH PMN AND MONONUCLEAR NO ORGANISMS SEEN    Culture NO GROWTH 2 DAYS  Final   Report Status PENDING  Incomplete  Aerobic/Anaerobic Culture (surgical/deep wound)     Status: None (Preliminary result)   Collection Time: 01/21/16  6:52 PM  Result Value Ref Range Status   Specimen Description WOUND LEFT WRIST  Final   Special Requests NONE  Final   Gram Stain   Final    WBC PRESENT,BOTH PMN AND MONONUCLEAR NO ORGANISMS SEEN    Culture PENDING  Incomplete   Report Status PENDING  Incomplete     ASSESSMENT: 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 I will continue cefazolin and rifampin. Repeat blood cultures are negative. Cardiology consultation for consideration of pacemaker removal has been requested.   PLAN: 1. Continue cefazolin and rifampin 2. He will need a centrally placed IV catheter because of his chronic kidney disease 3. Cardiology evaluation  Michel Bickers, MD [facility] [place] [phone] pager   [phone]  cell 01/22/2016, 11:42 AM

## 2016-01-22 NOTE — Progress Notes (Signed)
Left wrist still painful.  01-21-16:  OR fluid from wrist +CPPD crystals, both intra-cellular and extra-cellular   OR Gram stain-->+PMNs, No organisms  01-19-16:  Wrist aspirate cultures still no growth  Left wrist likely not septic arthritis, but pseudogout.  We will have to wait for both sets of cultures to be final to be sure.  JP drain pulled by me  Plan: 1. Nursing to remove splint/dressing tomorrow evening and go back to removable velcro wrist splint for comfort 2. OT ordered for digital ROM ex 3. Left wrist WBAT 4. May d/c anytime per Hand Surgery POV 5. F/U with me on July 29 or July 31 (already written into d/c f/u section of EPIC)  I will be away 7/14 - 7/16.  Please direct any after-hours or weekend questions to [place] provider on-call [phone])  Micheline Rough, MD Hand Surgery Mobile [phone]

## 2016-01-22 NOTE — Clinical Social Work Note (Signed)
CSW presented bed offers to patient. Told him about wife's preference for [facility]. He is agreeable to placement there. Wife notified. Admissions coordinator, Jolyne Loa, notified.  Dayton Scrape, [place]

## 2016-01-22 NOTE — Clinical Social Work Placement (Signed)
   CLINICAL SOCIAL WORK PLACEMENT  NOTE  Date:  01/22/2016  Patient Details  Name: [name] ROSETE Sr. MRN: [id number] Date of Birth: Feb 13, 1936  Clinical Social Work is seeking post-discharge placement for this patient at the [place] level of care (*CSW will initial, date and re-position this form in  chart as items are completed):  Yes   Patient/family provided with [facility] [facility] Work Department's list of facilities offering this level of care within the geographic area requested by the patient (or if unable, by the patient's family).  Yes   Patient/family informed of their freedom to choose among providers that offer the needed level of care, that participate in Medicare, Medicaid or managed care program needed by the patient, have an available bed and are willing to accept the patient.  Yes   Patient/family informed of [facility]'s ownership interest in [facility] and [facility], as well as of the fact that they are under no obligation to receive care at these facilities.  PASRR submitted to EDS on 01/21/16     PASRR number received on 01/21/16     Existing PASRR number confirmed on       FL2 transmitted to all facilities in geographic area requested by pt/family on 01/21/16     FL2 transmitted to all facilities within larger geographic area on       Patient informed that his/her managed care company has contracts with or will negotiate with certain facilities, including the following:        Yes   Patient/family informed of bed offers received.  Patient chooses bed at [facility]     Physician recommends and patient chooses bed at      Patient to be transferred to [facility] on  .  Patient to be transferred to facility by       Patient family notified on   of transfer.  Name of family member notified:        PHYSICIAN Please sign FL2     Additional Comment:    _______________________________________________ [name], LCSW 01/22/2016, 11:05 AM

## 2016-01-22 NOTE — Progress Notes (Signed)
PROGRESS NOTE  [name]  [id number] DOB: 1936/05/28 DOA: 01/16/2016 PCP: [name], MD Outpatient Specialists:  Subjective:  Some left wrist pain this AM. Underwent open I and D by ortho yesterday. No SOB. No chest pain.   Brief Narrative:  80 year old AAM with past medical history of SSS S/P PPM, not controlled diabetes, history of prostate cancer came in with multifocal pneumonia, hemoptysis, left wrist septic arthritis and MSSA bacteremia.  Assessment & Plan:   Principal Problem:   Staphylococcus aureus bacteremia Active Problems:   PAF (paroxysmal atrial fibrillation) (HCC)   CKD (chronic kidney disease), stage IV (HCC)   Pacemaker   CAP (community acquired pneumonia)   Pneumonia   Hemoptysis   Pain   Wrist swelling   Staphylococcus aureus bacteremia - ID input is appreciated. For possible removal of pacemaker and TEE. -Patient presented with multifocal pneumonia likely the cause of the staph aureus bacteremia. -Per [facility] it's MSSA, started on cefazolin and rifampin. -2-D echocardiogram showed 78 mobile mass at the pacemaker wire. - cardiology was consulted by Dr. Marthenia Rolling 7/12  Community acquired pneumonia -Multifocal pneumonia, initially started on Rocephin and vancomycin switched to cefazolin. -Supportive management with bronchodilators, mucolytics, antitussives and oxygen as needed.  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 - on appropriate abx per Dr. Megan Salon. -Cardiology consulted for further recommendation,? Pacemaker removal, Dr. Lovena Le .  Left wrist pain -Has swelling, warmth and tenderness, in the background of MSSA bacteremia and he has septic arthritis. - s/p incision by ortho, JP drain. On abx per ID.  Hemoptysis -Significant hemoptysis, patient is on aspirin, Plavix and CT without contrast showed multifocal pneumonia. -D-dimer elevated at 2.3,  likely secondary to the pneumonia. -Unlikely to be PE, as he does not have tachycardia or hypoxia.  Hematuria -Reportedly gross hematuria, ultrasound done showed blood clot versus tumor in the bladder. -will need urology evaluation  SSS status post PPM -2-D echocardiogram showed 87 mm mobile mass in the right atrium attached to the pacer wire. -This is likely represent endocarditis. Cardiology to evaluate.  CKD stage IV -Creatinine at baseline of 3.8  Hypokalemia -Replete with oral supplements.   DVT prophylaxis:  Code Status: Full Code Family Communication:  Disposition Plan:  Diet: Diet heart healthy/carb modified Room service appropriate?: Yes; Fluid consistency:: Thin  Consultants:   ID  Procedures:   Echo with mobile atrial mass  Antimicrobials:   Cefazolin   Rifampin   Objective: Filed Vitals:   01/21/16 2113 01/22/16 0021 01/22/16 0442 01/22/16 0922  BP: 155/72 126/59 123/53 137/73  Pulse: 86 81 77 89  Temp: 98.9 F (37.2 C) 98.7 F (37.1 C) 98.6 F (37 C) 98.7 F (37.1 C)  TempSrc: Oral Oral Oral Oral  Resp: 17 18 19 18   Height:      Weight:   73.211 kg (161 lb 6.4 oz)   SpO2: 98% 100% 100% 98%    Intake/Output Summary (Last 24 hours) at 01/22/16 1305 Last data filed at 01/22/16 0928  Gross per 24 hour  Intake    885 ml  Output   1040 ml  Net   -155 ml   Filed Weights   01/20/16 0455 01/21/16 0342 01/22/16 0442  Weight: 65.635 kg (144 lb 11.2 oz) 69.854 kg (154 lb) 73.211 kg (161 lb 6.4 oz)    Examination: General exam: Appears calm and comfortable  Respiratory system: Clear to auscultation. Respiratory effort normal.  Cardiovascular system: S1 & S2 heard, RRR. No JVD, murmurs, rubs, gallops or clicks. No pedal edema. Gastrointestinal system: Abdomen is nondistended, soft and nontender. No organomegaly or masses felt. Normal bowel sounds heard. Central nervous system: Alert and oriented. No focal neurological deficits. Extremities:  Symmetric 5 x 5 power. Skin: No rashes, lesions or ulcers Psychiatry: Judgement and insight appear normal. Mood & affect appropriate.   Data Reviewed: I have personally reviewed following labs and imaging studies  CBC:  Recent Labs Lab 01/16/16 1239 01/16/16 1801 01/18/16 0923 01/19/16 0359  WBC 13.1*  --  15.3* 13.3*  NEUTROABS 11.0*  --   --   --   HGB 9.7*  --  9.2* 8.8*  HCT 30.4*  --  28.7* 26.5*  MCV 102.4*  --  101.1* 97.8  PLT 100* 95* 108* 0000000*   Basic Metabolic Panel:  Recent Labs Lab 01/18/16 0923 01/19/16 0359 01/20/16 0309 01/21/16 0314 01/22/16 0523  NA 134* 134* 135 132* 131*  K 2.9* 3.6 3.2* 4.3 3.9  CL 100* 103 100* 100* 101  CO2 22 22 23 24  20*  GLUCOSE 222* 135* 117* 128* 94  BUN 86* 84* 73* 71* 82*  CREATININE 3.84* 3.60* 3.02* 3.06* 3.51*  CALCIUM 8.8* 9.0 9.2 9.0 8.8*   GFR: Estimated Creatinine Clearance: 17.9 mL/min (by C-G formula based on Cr of 3.51). Liver Function Tests:  Recent Labs Lab 01/16/16 1239  AST 30  ALT 12*  ALKPHOS 27*  BILITOT 0.7  PROT 7.6  ALBUMIN 4.2   No results for input(s): LIPASE, AMYLASE in the last 168 hours. No results for input(s): AMMONIA in the last 168 hours. Coagulation Profile:  Recent Labs Lab 01/16/16 1801 01/17/16 0424  INR 1.27 1.28   Cardiac Enzymes: No results for input(s): CKTOTAL, CKMB, CKMBINDEX, TROPONINI in the last 168 hours. BNP (last 3 results) No results for input(s): PROBNP in the last 8760 hours. HbA1C: No results for input(s): HGBA1C in the last 72 hours. CBG:  Recent Labs Lab 01/18/16 0634  GLUCAP 132*   Lipid Profile: No results for input(s): CHOL, HDL, LDLCALC, TRIG, CHOLHDL, LDLDIRECT in the last 72 hours. Thyroid Function Tests: No results for input(s): TSH, T4TOTAL, FREET4, T3FREE, THYROIDAB in the last 72 hours. Anemia Panel: No results for input(s): VITAMINB12, FOLATE, FERRITIN, TIBC, IRON, RETICCTPCT in the last 72 hours. Urine analysis:    Component  Value Date/Time   COLORURINE YELLOW 01/16/2016 1419   APPEARANCEUR CLOUDY* 01/16/2016 1419   LABSPEC 1.015 01/16/2016 1419   LABSPEC 1.010 05/19/2015 1231   PHURINE 5.5 01/16/2016 1419   PHURINE 6.0 05/19/2015 1231   GLUCOSEU NEGATIVE 01/16/2016 1419   GLUCOSEU Negative 05/19/2015 1231   HGBUR LARGE* 01/16/2016 1419   HGBUR Negative 05/19/2015 1231   BILIRUBINUR NEGATIVE 01/16/2016 1419   BILIRUBINUR Negative 05/19/2015 1231   KETONESUR NEGATIVE 01/16/2016 1419   KETONESUR Negative 05/19/2015 1231   PROTEINUR 100* 01/16/2016 1419   PROTEINUR < 30 05/19/2015 1231   UROBILINOGEN 0.2 05/19/2015 1231   UROBILINOGEN 0.2 04/03/2014 1911   NITRITE NEGATIVE 01/16/2016 1419   NITRITE Negative 05/19/2015 1231   LEUKOCYTESUR NEGATIVE 01/16/2016 1419   LEUKOCYTESUR Negative 05/19/2015 1231   Sepsis Labs:  Recent Results (from the past 240 hour(s))  Blood culture (routine x 2)     Status: Abnormal   Collection Time: 01/16/16  1:00 PM  Result Value Ref Range Status   Specimen Description LEFT ANTECUBITAL  Final   Special Requests BOTTLES DRAWN AEROBIC AND  ANAEROBIC 10CC  Final   Culture  Setup Time   Final    GRAM POSITIVE COCCI IN CLUSTERS IN BOTH AEROBIC AND ANAEROBIC BOTTLES CRITICAL RESULT CALLED TO, READ BACK BY AND VERIFIED WITH: J MARKLE 01/17/16 @ 0927 M VESTAL    Culture (A)  Final    STAPHYLOCOCCUS AUREUS SUSCEPTIBILITIES PERFORMED ON PREVIOUS CULTURE WITHIN THE LAST 5 DAYS.    Report Status 01/19/2016 FINAL  Final  Blood culture (routine x 2)     Status: Abnormal   Collection Time: 01/16/16  1:06 PM  Result Value Ref Range Status   Specimen Description BLOOD LEFT HAND  Final   Special Requests AEROBIC BOTTLE ONLY 10CC  Final   Culture  Setup Time   Final    GRAM POSITIVE COCCI IN CLUSTERS AEROBIC BOTTLE ONLY CRITICAL RESULT CALLED TO, READ BACK BY AND VERIFIED WITH: J MARKLE 01/17/16 @ 0927 M VESTAL    Culture STAPHYLOCOCCUS AUREUS (A)  Final   Report Status 01/19/2016  FINAL  Final   Organism ID, Bacteria STAPHYLOCOCCUS AUREUS  Final      Susceptibility   Staphylococcus aureus - MIC*    CIPROFLOXACIN >=8 RESISTANT Resistant     ERYTHROMYCIN <=0.25 SENSITIVE Sensitive     GENTAMICIN <=0.5 SENSITIVE Sensitive     OXACILLIN 0.5 SENSITIVE Sensitive     TETRACYCLINE <=1 SENSITIVE Sensitive     VANCOMYCIN <=0.5 SENSITIVE Sensitive     TRIMETH/SULFA <=10 SENSITIVE Sensitive     CLINDAMYCIN <=0.25 SENSITIVE Sensitive     RIFAMPIN <=0.5 SENSITIVE Sensitive     Inducible Clindamycin NEGATIVE Sensitive     * STAPHYLOCOCCUS AUREUS  Blood Culture ID Panel (Reflexed)     Status: Abnormal   Collection Time: 01/16/16  1:06 PM  Result Value Ref Range Status   Enterococcus species NOT DETECTED NOT DETECTED Final   Vancomycin resistance NOT DETECTED NOT DETECTED Final   Listeria monocytogenes NOT DETECTED NOT DETECTED Final   Staphylococcus species DETECTED (A) NOT DETECTED Final    Comment: CRITICAL RESULT CALLED TO, READ BACK BY AND VERIFIED WITH: J MARKLE 01/17/16 @ 0927 M VESTAL    Staphylococcus aureus DETECTED (A) NOT DETECTED Final    Comment: CRITICAL RESULT CALLED TO, READ BACK BY AND VERIFIED WITH: J MARKLE 01/17/16 @ 0927 M VESTAL    Methicillin resistance NOT DETECTED NOT DETECTED Final   Streptococcus species NOT DETECTED NOT DETECTED Final   Streptococcus agalactiae NOT DETECTED NOT DETECTED Final   Streptococcus pneumoniae NOT DETECTED NOT DETECTED Final   Streptococcus pyogenes NOT DETECTED NOT DETECTED Final   Acinetobacter baumannii NOT DETECTED NOT DETECTED Final   Enterobacteriaceae species NOT DETECTED NOT DETECTED Final   Enterobacter cloacae complex NOT DETECTED NOT DETECTED Final   Escherichia coli NOT DETECTED NOT DETECTED Final   Klebsiella oxytoca NOT DETECTED NOT DETECTED Final   Klebsiella pneumoniae NOT DETECTED NOT DETECTED Final   Proteus species NOT DETECTED NOT DETECTED Final   Serratia marcescens NOT DETECTED NOT DETECTED Final     Carbapenem resistance NOT DETECTED NOT DETECTED Final   Haemophilus influenzae NOT DETECTED NOT DETECTED Final   Neisseria meningitidis NOT DETECTED NOT DETECTED Final   Pseudomonas aeruginosa NOT DETECTED NOT DETECTED Final   Candida albicans NOT DETECTED NOT DETECTED Final   Candida glabrata NOT DETECTED NOT DETECTED Final   Candida krusei NOT DETECTED NOT DETECTED Final   Candida parapsilosis NOT DETECTED NOT DETECTED Final   Candida tropicalis NOT DETECTED NOT DETECTED Final  Culture,  sputum-assessment     Status: None   Collection Time: 01/16/16 10:24 PM  Result Value Ref Range Status   Specimen Description EXPECTORATED SPUTUM  Final   Special Requests NONE  Final   Sputum evaluation   Final    THIS SPECIMEN IS ACCEPTABLE. RESPIRATORY CULTURE REPORT TO FOLLOW.   Report Status 01/17/2016 FINAL  Final  Culture, respiratory (NON-Expectorated)     Status: None   Collection Time: 01/16/16 10:24 PM  Result Value Ref Range Status   Specimen Description EXPECTORATED SPUTUM  Final   Special Requests NONE  Final   Gram Stain   Final    RARE SQUAMOUS EPITHELIAL CELLS PRESENT ABUNDANT WBC PRESENT,BOTH PMN AND MONONUCLEAR FEW GRAM POSITIVE COCCI IN PAIRS IN CLUSTERS    Culture Consistent with normal respiratory flora.  Final   Report Status 01/19/2016 FINAL  Final  Culture, blood (routine x 2)     Status: None (Preliminary result)   Collection Time: 01/17/16  9:50 AM  Result Value Ref Range Status   Specimen Description BLOOD BLOOD LEFT ARM  Final   Special Requests BOTTLES DRAWN AEROBIC ONLY 5CC  Final   Culture NO GROWTH 4 DAYS  Final   Report Status PENDING  Incomplete  Culture, blood (routine x 2)     Status: None (Preliminary result)   Collection Time: 01/17/16  9:50 AM  Result Value Ref Range Status   Specimen Description BLOOD LEFT ANTECUBITAL  Final   Special Requests BOTTLES DRAWN AEROBIC ONLY 5CC  Final   Culture NO GROWTH 4 DAYS  Final   Report Status PENDING   Incomplete  Acid Fast Smear (AFB)     Status: None   Collection Time: 01/17/16 11:26 AM  Result Value Ref Range Status   AFB Specimen Processing Concentration  Final   Acid Fast Smear Negative  Final    Comment: (NOTE) Performed At: [facility] [place], [place] [id number] Lindon Romp MD [id number]    Source (AFB) SPUTUM  Final  Body fluid culture     Status: None (Preliminary result)   Collection Time: 01/19/16  4:37 PM  Result Value Ref Range Status   Specimen Description FLUID SYNOVIAL WRIST LEFT  Final   Special Requests NONE  Final   Gram Stain   Final    ABUNDANT WBC PRESENT,BOTH PMN AND MONONUCLEAR NO ORGANISMS SEEN    Culture NO GROWTH 2 DAYS  Final   Report Status PENDING  Incomplete  Aerobic/Anaerobic Culture (surgical/deep wound)     Status: None (Preliminary result)   Collection Time: 01/21/16  6:52 PM  Result Value Ref Range Status   Specimen Description WOUND LEFT WRIST  Final   Special Requests NONE  Final   Gram Stain   Final    WBC PRESENT,BOTH PMN AND MONONUCLEAR NO ORGANISMS SEEN    Culture PENDING  Incomplete   Report Status PENDING  Incomplete     Invalid input(s): PROCALCITONIN, LACTICACIDVEN   Radiology Studies: No results found.      Scheduled Meds: . amiodarone  100 mg Oral Daily  . atorvastatin  80 mg Oral QHS  . Calcifediol ER  30 mcg Oral QHS  . carvedilol  25 mg Oral BID WC  .  ceFAZolin (ANCEF) IV  2 g Intravenous Q12H  . cloNIDine  0.1 mg Oral BID  . colchicine  0.6 mg Oral Daily  . hydrALAZINE  50 mg Oral TID  . isosorbide mononitrate  120 mg Oral Daily  .  multivitamin with minerals  1 tablet Oral q morning - 10a  . rifampin  600 mg Oral Daily  . tamsulosin  0.4 mg Oral QPC supper   Continuous Infusions:     LOS: 6 days    Time spent: 35 minutes    Mir Marry Guan, MD [facility] Pager [phone]  If 7PM-7AM, please contact night-coverage [email or web address] Password  [email or web address] 01/22/2016, 1:05 PM

## 2016-01-22 NOTE — Progress Notes (Signed)
S/p I&D left wrist 7-12 in the PM Will plan to re-evaluate patient later tonight or in the AM, likely pulling the drain at that time. Keep dressing clean/dry/intact until then. Elevate hand, Move fingers

## 2016-01-23 DIAGNOSIS — M00832 Arthritis due to other bacteria, left wrist: Secondary | ICD-10-CM

## 2016-01-23 DIAGNOSIS — Y718 Miscellaneous cardiovascular devices associated with adverse incidents, not elsewhere classified: Secondary | ICD-10-CM

## 2016-01-23 LAB — GLUCOSE, CAPILLARY
Glucose-Capillary: 155 mg/dL — ABNORMAL HIGH (ref 65–99)
Glucose-Capillary: 228 mg/dL — ABNORMAL HIGH (ref 65–99)
Glucose-Capillary: 266 mg/dL — ABNORMAL HIGH (ref 65–99)

## 2016-01-23 LAB — CBC
HCT: 22.9 % — ABNORMAL LOW (ref 39.0–52.0)
Hemoglobin: 7.9 g/dL — ABNORMAL LOW (ref 13.0–17.0)
MCH: 31.9 pg (ref 26.0–34.0)
MCHC: 34.5 g/dL (ref 30.0–36.0)
MCV: 92.3 fL (ref 78.0–100.0)
PLATELETS: 169 10*3/uL (ref 150–400)
RBC: 2.48 MIL/uL — AB (ref 4.22–5.81)
RDW: 17.2 % — ABNORMAL HIGH (ref 11.5–15.5)
WBC: 9.6 10*3/uL (ref 4.0–10.5)

## 2016-01-23 LAB — BASIC METABOLIC PANEL
Anion gap: 10 (ref 5–15)
BUN: 83 mg/dL — AB (ref 6–20)
CHLORIDE: 98 mmol/L — AB (ref 101–111)
CO2: 20 mmol/L — AB (ref 22–32)
CREATININE: 3.69 mg/dL — AB (ref 0.61–1.24)
Calcium: 8.5 mg/dL — ABNORMAL LOW (ref 8.9–10.3)
GFR calc non Af Amer: 14 mL/min — ABNORMAL LOW (ref >60)
GFR, EST AFRICAN AMERICAN: 17 mL/min — AB (ref >60)
Glucose, Bld: 127 mg/dL — ABNORMAL HIGH (ref 65–99)
Potassium: 3.7 mmol/L (ref 3.5–5.1)
Sodium: 128 mmol/L — ABNORMAL LOW (ref 135–145)

## 2016-01-23 MED ORDER — OXYCODONE HCL 5 MG PO TABS
5.0000 mg | ORAL_TABLET | ORAL | Status: AC | PRN
  Administered 2016-01-23 – 2016-01-25 (×2): 5 mg via ORAL
  Filled 2016-01-23 (×3): qty 1

## 2016-01-23 MED ORDER — PREDNISONE 10 MG PO TABS
50.0000 mg | ORAL_TABLET | Freq: Every day | ORAL | Status: DC
  Administered 2016-01-23 – 2016-01-29 (×7): 50 mg via ORAL
  Filled 2016-01-23 (×3): qty 1
  Filled 2016-01-23: qty 5
  Filled 2016-01-23 (×3): qty 1
  Filled 2016-01-23 (×2): qty 5

## 2016-01-23 MED ORDER — CALCIFEDIOL ER 30 MCG PO CPCR
30.0000 ug | ORAL_CAPSULE | Freq: Every day | ORAL | Status: DC
  Filled 2016-01-23: qty 1

## 2016-01-23 NOTE — Progress Notes (Signed)
Patient ID: [name] Brow., male   DOB: 1935/07/26, 80 y.o.   MRN: [id number]         [facility]    Date of Admission:  01/16/2016           Day 8 antibiotics  Principal Problem:   Staphylococcus aureus bacteremia Active Problems:   CAP (community acquired pneumonia)   Pacemaker   PAF (paroxysmal atrial fibrillation) (HCC)   CKD (chronic kidney disease), stage IV (HCC)   Pneumonia   Hemoptysis   Pain   Wrist swelling   . amiodarone  100 mg Oral Daily  . atorvastatin  80 mg Oral QHS  . Calcifediol ER  30 mcg Oral QHS  . carvedilol  25 mg Oral BID WC  .  ceFAZolin (ANCEF) IV  2 g Intravenous Q12H  . cloNIDine  0.1 mg Oral BID  . hydrALAZINE  50 mg Oral TID  . isosorbide mononitrate  120 mg Oral Daily  . multivitamin with minerals  1 tablet Oral q morning - 10a  . predniSONE  50 mg Oral Q breakfast  . rifampin  600 mg Oral Daily  . tamsulosin  0.4 mg Oral QPC supper    SUBJECTIVE: He Is feeling a little bit better today. His left wrist is not as painful.  Review of Systems: Review of Systems  Constitutional: Negative for fever, chills and diaphoresis.  Respiratory: Positive for cough and sputum production. Negative for hemoptysis.   Cardiovascular: Negative for chest pain.  Musculoskeletal: Positive for joint pain.    Past Medical History  Diagnosis Date  . Hypertension   . Hyperlipidemia   . GERD (gastroesophageal reflux disease)   . DVT (deep venous thrombosis) ([place]) 2011    Right arm  . Atrial fibrillation ([place])   . Gout   . Depressive disorder   . Internal hemorrhoids without mention of complication   . Gastroparesis   . Osteopenia   . Ischemic colitis ([place])   . Peripheral artery disease ([place])   . Chronic diastolic heart failure ([place])   . 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 Renal artery stenosis ([place])   . CKD (chronic kidney disease)   . Vitamin B 12 deficiency   . CAD (coronary artery disease)     s/b CABG 1994, and  subsequent stents. Repeat CABG 12/2011,  . Prostate cancer ([place]) 1997    XRT and lupron  . Myelodysplastic syndrome ([place]) 05/22/2013    With low hemoglobin and platelets treated with Procrit  . Angiomyolipoma 2009    On both kidneys noted in 2009  . Sick sinus syndrome ([place])   . Peptic ulcer     S/p partial gastrectomy in 1969  . Arthritis     neck and left wrist  . History of hiatal hernia   . Diabetes mellitus type 2 with peripheral artery disease (HCC)     DIET CONTROLLED  . Pneumonia 01/16/2016  . Presence of permanent cardiac pacemaker     Social History  Substance Use Topics  . Smoking status: Former Smoker -- 20 years    Types: Cigarettes    Quit date: 07/12/1969  . Smokeless tobacco: Never Used  . Alcohol Use: No    Family History  Problem Relation Age of Onset  . Diabetes Mother   . Heart disease Mother     Heart Disease before age 31  . Hyperlipidemia Mother   . Hypertension Mother   . CVA Mother 30  cause of death  . Cancer Father     stomach/liver  . Hypertension Father     possibly hypertensive  . Cancer Sister     Breast cancer  . Heart disease Daughter     Heart Disease before age 3  . Hypertension Daughter   . Heart attack Daughter   . CAD Brother   . Heart disease Brother   . Cancer Paternal Uncle     colon  . Heart disease Sister   . Diabetes Sister   . Hypertension Sister    Allergies  Allergen Reactions  . Nsaids Other (See Comments)    GI issue  . Ibuprofen Other (See Comments)    GI Issues  . Ace Inhibitors Cough    OBJECTIVE: Filed Vitals:   01/22/16 2100 01/23/16 0247 01/23/16 0649 01/23/16 0853  BP: 126/54  119/61 154/68  Pulse: 72  70 85  Temp: 99 F (37.2 C)  98.9 F (37.2 C)   TempSrc: Oral  Oral   Resp: 18     Height:      Weight:  167 lb 3.2 oz (75.841 kg)    SpO2: 100%  98%    Body mass index is 23.99 kg/(m^2).  Physical Exam  Constitutional: He is oriented to person, place, and time.  He is in better  spirits today.   Cardiovascular: Normal rate and regular rhythm.   No murmur heard. Pulmonary/Chest: Effort normal and breath sounds normal.  Breath sounds clear anteriorly. Right anterior chest pacemaker site looks good.  Musculoskeletal:  His left wrist is is wrapped.  Neurological: He is alert and oriented to person, place, and time.  Skin: No rash noted.  Psychiatric: Mood and affect normal.    Lab Results Lab Results  Component Value Date   WBC 9.6 01/23/2016   HGB 7.9* 01/23/2016   HCT 22.9* 01/23/2016   MCV 92.3 01/23/2016   PLT 169 01/23/2016    Lab Results  Component Value Date   CREATININE 3.69* 01/23/2016   BUN 83* 01/23/2016   NA 128* 01/23/2016   K 3.7 01/23/2016   CL 98* 01/23/2016   CO2 20* 01/23/2016    Lab Results  Component Value Date   ALT 12* 01/16/2016   AST 30 01/16/2016   ALKPHOS 27* 01/16/2016   BILITOT 0.7 01/16/2016     Microbiology: Recent Results (from the past 240 hour(s))  Blood culture (routine x 2)     Status: Abnormal   Collection Time: 01/16/16  1:00 PM  Result Value Ref Range Status   Specimen Description LEFT ANTECUBITAL  Final   Special Requests BOTTLES DRAWN AEROBIC AND ANAEROBIC 10CC  Final   Culture  Setup Time   Final    GRAM POSITIVE COCCI IN CLUSTERS IN BOTH AEROBIC AND ANAEROBIC BOTTLES CRITICAL RESULT CALLED TO, READ BACK BY AND VERIFIED WITH: J MARKLE 01/17/16 @ 0927 M VESTAL    Culture (A)  Final    STAPHYLOCOCCUS AUREUS SUSCEPTIBILITIES PERFORMED ON PREVIOUS CULTURE WITHIN THE LAST 5 DAYS.    Report Status 01/19/2016 FINAL  Final  Blood culture (routine x 2)     Status: Abnormal   Collection Time: 01/16/16  1:06 PM  Result Value Ref Range Status   Specimen Description BLOOD LEFT HAND  Final   Special Requests AEROBIC BOTTLE ONLY 10CC  Final   Culture  Setup Time   Final    GRAM POSITIVE COCCI IN CLUSTERS AEROBIC BOTTLE ONLY CRITICAL RESULT CALLED TO, READ BACK BY  AND VERIFIED WITH: Mardella Layman 01/17/16 @ 0927 M  VESTAL    Culture STAPHYLOCOCCUS AUREUS (A)  Final   Report Status 01/19/2016 FINAL  Final   Organism ID, Bacteria STAPHYLOCOCCUS AUREUS  Final      Susceptibility   Staphylococcus aureus - MIC*    CIPROFLOXACIN >=8 RESISTANT Resistant     ERYTHROMYCIN <=0.25 SENSITIVE Sensitive     GENTAMICIN <=0.5 SENSITIVE Sensitive     OXACILLIN 0.5 SENSITIVE Sensitive     TETRACYCLINE <=1 SENSITIVE Sensitive     VANCOMYCIN <=0.5 SENSITIVE Sensitive     TRIMETH/SULFA <=10 SENSITIVE Sensitive     CLINDAMYCIN <=0.25 SENSITIVE Sensitive     RIFAMPIN <=0.5 SENSITIVE Sensitive     Inducible Clindamycin NEGATIVE Sensitive     * STAPHYLOCOCCUS AUREUS  Blood Culture ID Panel (Reflexed)     Status: Abnormal   Collection Time: 01/16/16  1:06 PM  Result Value Ref Range Status   Enterococcus species NOT DETECTED NOT DETECTED Final   Vancomycin resistance NOT DETECTED NOT DETECTED Final   Listeria monocytogenes NOT DETECTED NOT DETECTED Final   Staphylococcus species DETECTED (A) NOT DETECTED Final    Comment: CRITICAL RESULT CALLED TO, READ BACK BY AND VERIFIED WITH: J MARKLE 01/17/16 @ 0927 M VESTAL    Staphylococcus aureus DETECTED (A) NOT DETECTED Final    Comment: CRITICAL RESULT CALLED TO, READ BACK BY AND VERIFIED WITH: J MARKLE 01/17/16 @ 0927 M VESTAL    Methicillin resistance NOT DETECTED NOT DETECTED Final   Streptococcus species NOT DETECTED NOT DETECTED Final   Streptococcus agalactiae NOT DETECTED NOT DETECTED Final   Streptococcus pneumoniae NOT DETECTED NOT DETECTED Final   Streptococcus pyogenes NOT DETECTED NOT DETECTED Final   Acinetobacter baumannii NOT DETECTED NOT DETECTED Final   Enterobacteriaceae species NOT DETECTED NOT DETECTED Final   Enterobacter cloacae complex NOT DETECTED NOT DETECTED Final   Escherichia coli NOT DETECTED NOT DETECTED Final   Klebsiella oxytoca NOT DETECTED NOT DETECTED Final   Klebsiella pneumoniae NOT DETECTED NOT DETECTED Final   Proteus species NOT  DETECTED NOT DETECTED Final   Serratia marcescens NOT DETECTED NOT DETECTED Final   Carbapenem resistance NOT DETECTED NOT DETECTED Final   Haemophilus influenzae NOT DETECTED NOT DETECTED Final   Neisseria meningitidis NOT DETECTED NOT DETECTED Final   Pseudomonas aeruginosa NOT DETECTED NOT DETECTED Final   Candida albicans NOT DETECTED NOT DETECTED Final   Candida glabrata NOT DETECTED NOT DETECTED Final   Candida krusei NOT DETECTED NOT DETECTED Final   Candida parapsilosis NOT DETECTED NOT DETECTED Final   Candida tropicalis NOT DETECTED NOT DETECTED Final  Culture, sputum-assessment     Status: None   Collection Time: 01/16/16 10:24 PM  Result Value Ref Range Status   Specimen Description EXPECTORATED SPUTUM  Final   Special Requests NONE  Final   Sputum evaluation   Final    THIS SPECIMEN IS ACCEPTABLE. RESPIRATORY CULTURE REPORT TO FOLLOW.   Report Status 01/17/2016 FINAL  Final  Culture, respiratory (NON-Expectorated)     Status: None   Collection Time: 01/16/16 10:24 PM  Result Value Ref Range Status   Specimen Description EXPECTORATED SPUTUM  Final   Special Requests NONE  Final   Gram Stain   Final    RARE SQUAMOUS EPITHELIAL CELLS PRESENT ABUNDANT WBC PRESENT,BOTH PMN AND MONONUCLEAR FEW GRAM POSITIVE COCCI IN PAIRS IN CLUSTERS    Culture Consistent with normal respiratory flora.  Final   Report Status 01/19/2016 FINAL  Final  Culture, blood (routine x 2)     Status: None   Collection Time: 01/17/16  9:50 AM  Result Value Ref Range Status   Specimen Description BLOOD BLOOD LEFT ARM  Final   Special Requests BOTTLES DRAWN AEROBIC ONLY 5CC  Final   Culture NO GROWTH 5 DAYS  Final   Report Status 01/22/2016 FINAL  Final  Culture, blood (routine x 2)     Status: None   Collection Time: 01/17/16  9:50 AM  Result Value Ref Range Status   Specimen Description BLOOD LEFT ANTECUBITAL  Final   Special Requests BOTTLES DRAWN AEROBIC ONLY 5CC  Final   Culture NO GROWTH 5  DAYS  Final   Report Status 01/22/2016 FINAL  Final  Acid Fast Smear (AFB)     Status: None   Collection Time: 01/17/16 11:26 AM  Result Value Ref Range Status   AFB Specimen Processing Concentration  Final   Acid Fast Smear Negative  Final    Comment: (NOTE) Performed At: [facility] [place], [place] [id number] Lindon Romp MD [id number]    Source (AFB) SPUTUM  Final  Body fluid culture     Status: None   Collection Time: 01/19/16  4:37 PM  Result Value Ref Range Status   Specimen Description FLUID SYNOVIAL WRIST LEFT  Final   Special Requests NONE  Final   Gram Stain   Final    ABUNDANT WBC PRESENT,BOTH PMN AND MONONUCLEAR NO ORGANISMS SEEN    Culture NO GROWTH 3 DAYS  Final   Report Status 01/22/2016 FINAL  Final  Aerobic/Anaerobic Culture (surgical/deep wound)     Status: None (Preliminary result)   Collection Time: 01/21/16  6:52 PM  Result Value Ref Range Status   Specimen Description WOUND LEFT WRIST  Final   Special Requests NONE  Final   Gram Stain   Final    WBC PRESENT,BOTH PMN AND MONONUCLEAR NO ORGANISMS SEEN    Culture NO GROWTH < 24 HOURS  Final   Report Status PENDING  Incomplete     ASSESSMENT: He has MSSA bacteremia complicated by right lower lobe pneumonia, pacemaker endocarditis and probable septic arthritis of his left wrist. I will continue cefazolin and rifampin. He will need to have his pacemaker removed. Current guidelines state that he should be safe to replace a new pacemaker 14 days after the first negative blood culture (that would be 01/31/2016). He will need a total of 4 weeks of antibiotic therapy through 02/12/2016. I would recommend consulting interventional radiology to have a cuffed central catheter placed because of his chronic kidney disease and wanting to avoid a PICC.  PLAN: 1. Continue cefazolin and rifampin 2. He will need a centrally placed IV catheter because of his chronic kidney  disease 3. Cardiology evaluation 4. Please call Dr. Tommy Medal [phone]) for any infectious disease questions this weekend  Michel Bickers, MD [facility] for [place] [phone] pager   [phone] cell 01/23/2016, 10:43 AM

## 2016-01-23 NOTE — Progress Notes (Signed)
TEE scheduled for 9am on Monday preliminarily - d/w Tommye Standard with EP - patient may not require TEE at all so i held off writing orders pending EP decision. Ganesh Deeg PA-C

## 2016-01-23 NOTE — Consult Note (Signed)
ELECTROPHYSIOLOGY CONSULT NOTE    Patient ID: [name] Marble Sr. MRN: [id number], DOB/AGE: 78-Jul-1937 78 y.o.  Admit date: 01/16/2016 Date of Consult: 01/23/2016  Primary Physician: [name], MD Primary Cardiologist: Dr. Tamala Julian Electrophysiologist: Dr. Rayann Heman  Reason for Consultation: bacteremia, mass on RA lead by TTE  HPI: [name] Brow. is a 80 y.o. male with PMHx of PAFib, not on a/c secondary to GIB history, CAD, CRI, stage IV, CAD, HTN, Liddle's Syndrome, HLD, myelodysplastic syndrome, PVD, was admitted to [facility]/16/16 with c/o hemoptysis and hypotension noted by his PMD, admitted with diagnosis of multifocal pneumonia, he was found with coagulopathy, as well as hematuria.  His BC came + MSSA bacteremia ID was consulted to his case.  He developed severe wrist pain,ortho consulted and on 01/21/16 underwent 1. Arthrotomy of left wrist joint, with irrigation and drainage and 2. Left dorsal third compartment tenosynovectomy and cultures so far negative.  Ortho suspects likely pseudogout.  Ep is being asked in regards to TTE findings of echodensity on RA lead with bacteremia.  The patient is feeling improved but tired in general, denies any CP, no ongoing cough or SOB, his wrist remains sore but improving.  LABS currently: BUN/Creat 83/3.69 (appears about his baseline) K+ 3.7 H/H 7.9/22.9 WBC 9.6 (down from 13.1) plts 169 (up from 100) INR 1.28 (down from 2.43)  Past Medical History  Diagnosis Date  . Hypertension   . Hyperlipidemia   . GERD (gastroesophageal reflux disease)   . DVT (deep venous thrombosis) ([place]) 2011    Right arm  . Atrial fibrillation ([place])   . Gout   . Depressive disorder   . Internal hemorrhoids without mention of complication   . Gastroparesis   . Osteopenia   . Ischemic colitis ([place])   . Peripheral artery disease ([place])   . Chronic diastolic heart failure ([place])   . 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 Renal artery stenosis ([place])   . CKD  (chronic kidney disease)   . Vitamin B 12 deficiency   . CAD (coronary artery disease)     s/b CABG 1994, and subsequent stents. Repeat CABG 12/2011,  . Prostate cancer ([place]) 1997    XRT and lupron  . Myelodysplastic syndrome ([employer]) 05/22/2013    With low hemoglobin and platelets treated with Procrit  . Angiomyolipoma 2009    On both kidneys noted in 2009  . Sick sinus syndrome ([place])   . Peptic ulcer     S/p partial gastrectomy in 1969  . Arthritis     neck and left wrist  . History of hiatal hernia   . Diabetes mellitus type 2 with peripheral artery disease ([facility])     DIET CONTROLLED  . Pneumonia 01/16/2016  . Presence of permanent cardiac pacemaker      Surgical History:  Past Surgical History  Procedure Laterality Date  . Hiatal hernia repair      and ulcer repair  . Appendectomy  1991  . Pacemaker insertion  10/18/1994    DDD pacemaker, [employer]. Gen change 12/10/2003.  Marland Kitchen Coronary artery bypass graft  01/22/1993  . Cholecystectomy  Oct 2009    Laparoscopic  . Coronary artery bypass graft  01/03/2012    Procedure: REDO CORONARY ARTERY BYPASS GRAFTING (CABG);  Surgeon: Gaye Pollack, MD;  Location: [place];  Service: Open Heart Surgery;  Laterality: N/A;  Redo CABG x  using bilateral internal mammary arteries;  left leg greater saphenous vein harvested endoscopically  .  Celiac artery angioplasty  05-16-12    and stenting  . Partial gastrectomy  1969    Hx of ulcer s/p partial gastrectomy/ has pernicious anemia  . Other surgical history  02/13/13    superior mesenteric artery angiogram  . Thrombectomy / embolectomy subclavian artery  02/02/10    Right subclavian thromboectomy and venous angioplasty, and chronic mesenteric ischemia with Herculink stenting to superior mesenteric and celiac arteries - Dr. Trula Slade  . Other surgical history  05/16/12    Stent in stomach  . Pacemaker generator change  12/10/2003    SJM Identity XL DR performed by Dr Leonia Reeves  . Visceral angiogram N/A  05/25/2011    Procedure: VISCERAL ANGIOGRAM;  Surgeon: Serafina Mitchell, MD;  Location: [facility] CATH LAB;  Service: Cardiovascular;  Laterality: N/A;  . Abdominal angiogram N/A 05/25/2011    Procedure: ABDOMINAL ANGIOGRAM;  Surgeon: Serafina Mitchell, MD;  Location: [facility] CATH LAB;  Service: Cardiovascular;  Laterality: N/A;  . Lower extremity angiogram Bilateral 05/25/2011    Procedure: LOWER EXTREMITY ANGIOGRAM;  Surgeon: Serafina Mitchell, MD;  Location: [facility] CATH LAB;  Service: Cardiovascular;  Laterality: Bilateral;  . Left heart catheterization with coronary/graft angiogram  12/24/2011    Procedure: LEFT HEART CATHETERIZATION WITH Beatrix Fetters;  Surgeon: Sinclair Grooms, MD;  Location: [facility] CATH LAB;  Service: Cardiovascular;;  . Visceral angiogram Bilateral 12/28/2011    Procedure: VISCERAL ANGIOGRAM;  Surgeon: Serafina Mitchell, MD;  Location: [facility] CATH LAB;  Service: Cardiovascular;  Laterality: Bilateral;  . Visceral angiogram N/A 05/16/2012    Procedure: VISCERAL ANGIOGRAM;  Surgeon: Serafina Mitchell, MD;  Location: [facility] CATH LAB;  Service: Cardiovascular;  Laterality: N/A;  . Visceral angiogram N/A 02/13/2013    Procedure: VISCERAL ANGIOGRAM;  Surgeon: Serafina Mitchell, MD;  Location: [facility] CATH LAB;  Service: Cardiovascular;  Laterality: N/A;  . Renal angiogram N/A 02/13/2013    Procedure: RENAL ANGIOGRAM;  Surgeon: Serafina Mitchell, MD;  Location: [facility] CATH LAB;  Service: Cardiovascular;  Laterality: N/A;  . Abdominal angiogram N/A 02/13/2013    Procedure: ABDOMINAL ANGIOGRAM;  Surgeon: Serafina Mitchell, MD;  Location: [facility] CATH LAB;  Service: Cardiovascular;  Laterality: N/A;  . Inguinal hernia repair Right 10/28/2015    Procedure: OPEN RIGHT INGUINAL HERNIA REPAIR;  Surgeon: Greer Pickerel, MD;  Location: [facility];  Service: General;  Laterality: Right;  . Insertion of mesh Right 10/28/2015    Procedure: INSERTION OF MESH;  Surgeon: Greer Pickerel, MD;  Location: [facility] OR[facility];  Service: General;  Laterality: Right;  . I&d  extremity Left 01/21/2016    Procedure: IRRIGATION AND DEBRIDEMENT EXTREMITY;  Surgeon: Milly Jakob, MD;  Location: [place];  Service: Orthopedics;  Laterality: Left;     Prescriptions prior to admission  Medication Sig Dispense Refill Last Dose  . acetaminophen (ARTHRITIS PAIN RELIEF) 650 MG CR tablet Take 1,300 mg by mouth 3 (three) times daily.    01/16/2016 at Unknown time  . amiodarone (PACERONE) 200 MG tablet Take one-half tablet by  mouth daily 45 tablet 2 01/16/2016 at Unknown time  . aspirin 81 MG tablet Take 1 tablet (81 mg total) by mouth daily.   01/16/2016 at Unknown time  . atorvastatin (LIPITOR) 80 MG tablet Take 80 mg by mouth at bedtime.    01/15/2016 at Unknown time  . Calcifediol ER (RAYALDEE) 30 MCG CPCR Take 30 mcg by mouth at bedtime.   01/16/2016 at Unknown time  . CALCIUM-VITAMIN D PO Take 1 capsule  by mouth 2 (two) times daily.   01/16/2016 at Unknown time  . carvedilol (COREG) 25 MG tablet TAKE 1 TABLET BY MOUTH  TWICE DAILY WITH MEALS 180 tablet 2 01/16/2016 at 0800  . cloNIDine (CATAPRES) 0.1 MG tablet Take 0.1 mg by mouth 2 (two) times daily.   01/16/2016 at Unknown time  . clopidogrel (PLAVIX) 75 MG tablet Take 1 tablet by mouth  daily 90 tablet 2 01/16/2016 at Unknown time  . cyanocobalamin (,VITAMIN B-12,) 1000 MCG/ML injection Inject 1,000 mcg into the muscle every 30 (thirty) days. Vitamin B12 - last injection 09/01/15   Past Month at Unknown time  . epoetin alfa (EPOGEN,PROCRIT) 09811 UNIT/ML injection Inject 10,000 Units into the skin every 14 (fourteen) days. Done at [facility] cancer center - last injection 09/22/15   Past Week at Unknown time  . furosemide (LASIX) 80 MG tablet Take 80 mg by mouth 2 (two) times daily. Pt takes 1 and 1/2 tablet daily. (140 mg) total   01/16/2016 at Unknown time  . hydrALAZINE (APRESOLINE) 50 MG tablet Take 1 tablet (50 mg total) by mouth 3 (three) times daily. 90 tablet 3 01/16/2016 at Unknown time  . isosorbide mononitrate (IMDUR) 120 MG 24 hr tablet Take 1  tablet by mouth  daily 90 tablet 2 01/16/2016 at Unknown time  . Multiple Vitamin (MULTIVITAMIN WITH MINERALS) TABS tablet Take 1 tablet by mouth every morning.   01/16/2016 at Unknown time  . pantoprazole (PROTONIX) 40 MG tablet Take 1 tablet by mouth daily.   01/16/2016 at Unknown time  . potassium chloride SA (K-DUR,KLOR-CON) 20 MEQ tablet Take 20-40 mEq by mouth 3 (three) times daily. Take 2 tablets (40 meq) by mouth with breakfast, take 1 tablet (20 meq) with lunch and supper   01/16/2016 at Unknown time  . PRESCRIPTION MEDICATION Supportive Therapy [facility]   Taking  . silodosin (RAPAFLO) 8 MG CAPS capsule Take 8 mg by mouth at bedtime.    01/15/2016  . spironolactone (ALDACTONE) 25 MG tablet Take 25 mg by mouth daily.   01/16/2016 at Unknown time  . leuprolide (LUPRON) 30 MG injection Inject 30 mg into the muscle every 4 (four) months.   09/17/2015    Inpatient Medications:  . amiodarone  100 mg Oral Daily  . atorvastatin  80 mg Oral QHS  . Calcifediol ER  30 mcg Oral QHS  . carvedilol  25 mg Oral BID WC  .  ceFAZolin (ANCEF) IV  2 g Intravenous Q12H  . cloNIDine  0.1 mg Oral BID  . hydrALAZINE  50 mg Oral TID  . isosorbide mononitrate  120 mg Oral Daily  . multivitamin with minerals  1 tablet Oral q morning - 10a  . predniSONE  50 mg Oral Q breakfast  . rifampin  600 mg Oral Daily  . tamsulosin  0.4 mg Oral QPC supper    Allergies:  Allergies  Allergen Reactions  . Nsaids Other (See Comments)    GI issue  . Ibuprofen Other (See Comments)    GI Issues  . Ace Inhibitors Cough    Social History   Social History  . Marital Status: Married    Spouse Name: N/A  . Number of Children: N/A  . Years of Education: N/A   Occupational History  . Not on file.   Social History Main Topics  . Smoking status: Former Smoker -- 20 years    Types: Cigarettes    Quit date: 07/12/1969  . Smokeless tobacco: Never Used  .  Alcohol Use: No  . Drug Use: No  . Sexual Activity: Yes   Other Topics  Concern  . Not on file   Social History Narrative     Family History  Problem Relation Age of Onset  . Diabetes Mother   . Heart disease Mother     Heart Disease before age 85  . Hyperlipidemia Mother   . Hypertension Mother   . CVA Mother 57    cause of death  . Cancer Father     stomach/liver  . Hypertension Father     possibly hypertensive  . Cancer Sister     Breast cancer  . Heart disease Daughter     Heart Disease before age 31  . Hypertension Daughter   . Heart attack Daughter   . CAD Brother   . Heart disease Brother   . Cancer Paternal Uncle     colon  . Heart disease Sister   . Diabetes Sister   . Hypertension Sister      Review of Systems: All other systems reviewed and are otherwise negative except as noted above.  Physical Exam: Filed Vitals:   01/22/16 2100 01/23/16 0247 01/23/16 0649 01/23/16 0853  BP: 126/54  119/61 154/68  Pulse: 72  70 85  Temp: 99 F (37.2 C)  98.9 F (37.2 C)   TempSrc: Oral  Oral   Resp: 18     Height:      Weight:  167 lb 3.2 oz (75.841 kg)    SpO2: 100%  98%     GEN- The patient is chronically ill appearing, alert and oriented x 3 today.   HEENT: normocephalic, atraumatic; sclera clear, conjunctiva pink; hearing intact; oropharynx clear; neck supple, no JVP Lymph- no cervical lymphadenopathy Lungs- Clear to ausculation bilaterally, normal work of breathing.  No wheezes, rales, rhonchi Heart- Regular rate and rhythm, no significant murmurs, rubs or gallops, PMI not laterally displaced GI- soft, non-tender, non-distended, bowel sounds present Extremities- no clubbing, cyanosis, or edema, R wrist is splinted MS- no significant deformity or atrophy Skin- warm and dry, no rash or lesion Psych- euthymic mood, full affect Neuro- no gross deficits observed  Labs:   Lab Results  Component Value Date   WBC 9.6 01/23/2016   HGB 7.9* 01/23/2016   HCT 22.9* 01/23/2016   MCV 92.3 01/23/2016   PLT 169 01/23/2016      Recent Labs Lab 01/16/16 1239  01/23/16 0239  NA 139  < > 128*  K 3.5  < > 3.7  CL 105  < > 98*  CO2 21*  < > 20*  BUN 67*  < > 83*  CREATININE 3.48*  < > 3.69*  CALCIUM 9.5  < > 8.5*  PROT 7.6  --   --   BILITOT 0.7  --   --   ALKPHOS 27*  --   --   ALT 12*  --   --   AST 30  --   --   GLUCOSE 141*  < > 127*  < > = values in this interval not displayed.    Radiology/Studies:  Dg Chest 2 View 01/16/2016  CLINICAL DATA:  Chest pain, cough for 2 days EXAM: CHEST  2 VIEW COMPARISON:  CT chest 04/03/2014 FINDINGS: Cardiomediastinal silhouette is unremarkable. Status post median sternotomy. Dual lead cardiac pacemaker with leads in right atrium and right ventricle. There is streaky airspace opacification in right base infrahilar region and right upper lobe peripheral perihilar. Findings highly  suspicious for pneumonia. Probable small right pleural effusion. Left lung is clear. IMPRESSION: There is streaky airspace opacification in right base infrahilar region and right upper lobe peripheral perihilar. Findings highly suspicious for pneumonia. Followup PA and lateral chest X-ray is recommended in 3-4 weeks following trial of antibiotic therapy to ensure resolution and exclude underlying malignancy. Probable small right pleural effusion. Electronically Signed   By: Lahoma Crocker M.D.   On: 01/16/2016 13:58    Ct Chest Wo Contrast 01/16/2016  CLINICAL DATA:  Hemoptysis, cough.  Symptoms since Monday. EXAM: CT CHEST WITHOUT CONTRAST TECHNIQUE: TECHNIQUE Multidetector CT imaging of the chest was performed following the standard protocol without IV contrast. COMPARISON:  Chest radiograph 01/16/2016, CT 04/03/2014 FINDINGS: Cardiovascular: Calcification of the thoracic aorta. Coronary bypass graft. Pacer wires in the RIGHT heart. No pericardial fluid. Mediastinum/Nodes: No axillary or supraclavicular adenopathy. No mediastinal hilar adenopathy. Esophagus normal. Lungs/Pleura: There is consolidative airspace  disease in the RIGHT lower lobe with air bronchograms. A similar pattern in the posterior aspect the RIGHT upper lobe. Smaller pattern of consolidation in the LEFT lower lobe. Findings are consistent with multifocal pneumonia. Upper Abdomen: Limited view of the liver, kidneys, pancreas are unremarkable. Normal adrenal glands. Musculoskeletal: No aggressive osseous lesion.  Midline sternotomy. IMPRESSION: 1. Multifocal pneumonia with most dense infection in RIGHT lower lobe. 2. Coronary artery calcification and aortic atherosclerotic calcification. Electronically Signed   By: Suzy Bouchard M.D.   On: 01/16/2016 16:32   US Renal 01/17/2016  CLINICAL DATA:  80 year old male with microscopic hematuria. Personal history of prostate cancer. Initial encounter. EXAM: RENAL / URINARY TRACT ULTRASOUND COMPLETE COMPARISON:  CT Abdomen and Pelvis 08/30/2015, and earlier. Right upper quadrant ultrasound from today reported separately. FINDINGS: Right Kidney: Length: 12.3 cm. Echogenicity within normal limits. No mass or hydronephrosis visualized. Left Kidney: Length: 12.0 cm. Echogenicity within normal limits. No mass or hydronephrosis visualized. Bladder: 5 cm area which is dependent, complex, and mass like (image 15), however, there is no internal vascularity detected with brief color Doppler interrogation. Elsewhere the urinary bladder appears normal. Prostate measured to be 5.2 x 3.1 x 4.3 cm. IMPRESSION: 1. Abnormal 5 cm complex, mass-like area of dependent echogenicity in the urinary bladder. However, lack of internal vascularity on Doppler suggests this is more likely blood clot rather than a bladder tumor. Further evaluation recommended. 2. Normal for age sonographic appearance of both kidneys. Electronically Signed   By: Genevie Ann M.D.   On: 01/17/2016 08:01     EKG: SR TELEMETRY: SR 60's, rare A paced beat  01/19/16 Echocardiogram Study Conclusions - Left ventricle: The cavity size was normal. Wall thickness  was  increased in a pattern of severe LVH. Systolic function was  normal. There was dynamic obstruction, with a peak velocity of  351 cm/sec and a peak gradient of 49 mm Hg during Valsalva. Wall  motion was normal; there were no regional wall motion  abnormalities. Doppler parameters are consistent with abnormal  left ventricular relaxation (grade 1 diastolic dysfunction). - Aortic valve: Trileaflet; moderately thickened, moderately  calcified leaflets. - Aorta: Aortic root dimension: 39 mm (ED). - Ascending aorta: The ascending aorta was mildly dilated. - Right ventricle: Pacer wire or catheter noted in right ventricle. - Right atrium: Pacer wire or catheter noted in right atrium. There  is mobile echodensity (0.8 x 0.7cm) in the right atrium attached  to pacer wire. Consider TEE for further clarification if  clinically warranted. - Tricuspid valve: There is moderate thickening  of the pacing wire  in the right ventricle adjacent to the tricuspid valve. - Pulmonary arteries: Systolic pressure was mildly increased. PA  peak pressure: 37 mm Hg (S).    DEVICE HISTORY: SJM dual chamber PPM, implanted, last generator 12/10/03, original implant (leads) from 1996  Assessment and Plan:   1. Bacteremia with evidence of vegetation on his atrial pacing lead on TTE - note that his TEE is pending. I would anticipate he need to undergo lead extraction.      Staphylococcus aureus bacteremia   2. SSSx, PAFib w/PPM     He is in [place] is at least 4, not on a/c with hx of GIB     Echodensity on his RA lead by TTE     Likely will need to proceed with pacer system extraction, await Dr. Tanna Furry evaluation and POC      2. Multifocal pneumonia      (presenting with hemotysis)  3. Hematuria (bladder ?blood clot)      Pending urology  4. CKD    Signed, Tommye Standard, PA-C 01/23/2016 11:48 AM  EP Attending  Patient seen and examined. Agree with above. He has evidence  of endocarditis on his atrial lead and on blood cultures. His TEE is pending. I would anticipate extracting his pacing system after the TEE. My earliest day is next Tuesday. Will try to co-ordinate with CVTS. He does not appear to be ppm dependent.  I suspect his pneumonia is related to endocarditis with septic emboli.   Mikle Bosworth.D.

## 2016-01-23 NOTE — Progress Notes (Signed)
OT Cancellation Note  Patient Details Name: [name] PERTILE Sr. MRN: [id number] DOB: 06/19/1936   Cancelled Treatment:    Reason Eval/Treat Not Completed: Other (comment). Pt is managed Medicare and current D/C plan is SNF with bed already accepted. No apparent immediate acute care OT needs, therefore will defer OT to SNF. If OT eval is needed please call Acute Rehab Dept. at [phone] or text page OT at [phone].    Almon Register [id number] 01/23/2016, 7:14 AM

## 2016-01-23 NOTE — Progress Notes (Signed)
Pharmacy Antibiotic Note  [name] Haniff. is a 80 y.o. male admitted on 01/16/2016 with multifocal pna per chest CT and BCID + MSSA bacteremia. Pt also has pacemaker.  Pharmacy was consulted on 01/17/16 for cefazolin dosing. For possible removal of pacemaker and TEE.  CKD stage 4, Scr 3.69 ,at baseline. CrCl ~ 17 ml/min. Tc = Tm = 99, WBC 9.6k.   Plan: This patient's current antibiotics will be continued without adjustments.  Cefazolin 2g IV q12h   Height: 5\' 10"  (177.8 cm) Weight: 167 lb 3.2 oz (75.841 kg) IBW/kg (Calculated) : 73  Temp (24hrs), Avg:98.9 F (37.2 C), Min:98.6 F (37 C), Max:99 F (37.2 C)   Recent Labs Lab 01/18/16 0923 01/19/16 0359 01/20/16 0309 01/21/16 0314 01/22/16 0523 01/23/16 0239  WBC 15.3* 13.3*  --   --   --  9.6  CREATININE 3.84* 3.60* 3.02* 3.06* 3.51* 3.69*    Estimated Creatinine Clearance: 17 mL/min (by C-G formula based on Cr of 3.69).    Allergies  Allergen Reactions  . Nsaids Other (See Comments)    GI issue  . Ibuprofen Other (See Comments)    GI Issues  . Ace Inhibitors Cough    Antimicrobials this admission: Azith 7/7 x1 Rocephin 7/7 x1 Cefazolin 7/8 >> Rifampin 7/8 >>   Dose adjustments this admission: none  Microbiology results: 7/7 BCx x2: MSSA 2/2 7/8 BCx x2: neg/F 7/10 left wrist synovial fluid cx: neg/F 7/12 wound left wrist: ngtd    Thank you for allowing pharmacy to be a part of this patient's care. Nicole Cella, RPh Clinical Pharmacist Pager: [phone] 01/23/2016 4:29 PM

## 2016-01-23 NOTE — Progress Notes (Signed)
Physical Therapy Treatment Patient Details Name: [name] LACINA Sr. 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  Has CAP, hematuria, L wrist and ankle pain with gout history.  PMHx:  SSS, pacemaker, CKD, PAF,     PT Comments    Pt presents with worsened gout flare up in Bil ankles and requires max +2 assist to pivot to chair. Poor balance sitting EOB due to pain, with Rt and posterior lean.  SNF remains most appropriate d/c plan at this time.  Pt will benefit from continued skilled PT services to increase functional independence and safety.   Follow Up Recommendations  SNF     Equipment Recommendations  Other (comment) (TBD at next venue of care)    Recommendations for Other Services       Precautions / Restrictions Precautions Precautions: Fall Precaution Comments: septic arthritis Lt wrist, gout flare up Bil ankles Required Braces or Orthoses: Other Brace/Splint Other Brace/Splint: wrist splint for comfort only Restrictions Weight Bearing Restrictions: Yes LUE Weight Bearing: Weight bearing as tolerated    Mobility  Bed Mobility Overal bed mobility: Needs Assistance Bed Mobility: Supine to Sit     Supine to sit: +2 for physical assistance;+2 for safety/equipment;HOB elevated;Min assist     General bed mobility comments: Assist to elevate trunk as pt uses bed rail with Rt UE.  Bed pad used to scoot pt to EOB once sitting.  Transfers Overall transfer level: Needs assistance Equipment used: 2 person hand held assist;Left platform walker Transfers: Sit to/from Stand;Stand Pivot Transfers Sit to Stand: +2 physical assistance;+2 safety/equipment;From elevated surface;Max assist Stand pivot transfers: +2 physical assistance;+2 safety/equipment;From elevated surface;Max assist       General transfer  comment: Attempted sit>stand using platform RW; however pt very sensitive to light touch Bil LEs and RW in the way, pt achieves 75% standing.  Max +2 assist with use of bed pad to boost pt to standing with knees blocked.  Max +2 assist to pivot and direct pt to chair with pt shuffling feet minimally.  Ambulation/Gait             General Gait Details: did not attempt for pt/therapist safety   Stairs            Wheelchair Mobility    Modified Rankin (Stroke Patients Only)       Balance Overall balance assessment: Needs assistance Sitting-balance support: Single extremity supported;Feet supported Sitting balance-Leahy Scale: Poor Sitting balance - Comments: Pt leaning to Rt onto elbow with posterior lean as he fatigues sitting EOB as he is unable to utilize Bil LEs or Lt UE to steady.  Cues for anterior lean which pt cannot maintain.   Postural control: Posterior lean;Right lateral lean Standing balance support: Bilateral upper extremity supported;During functional activity Standing balance-Leahy Scale: Zero                      Cognition Arousal/Alertness: Awake/alert Behavior During Therapy: Anxious Overall Cognitive Status: Within Functional Limits for tasks assessed                      Exercises      General Comments General comments (skin integrity, edema, etc.): Gout has worsened since last session.      Pertinent Vitals/Pain Pain Assessment: Faces Faces Pain Scale: Hurts worst Pain Location:  Lt wrist and Bil ankles Pain Descriptors / Indicators: Tender;Moaning;Grimacing;Guarding Pain Intervention(s): Limited activity within patient's tolerance;Monitored during session;Repositioned    Home Living                      Prior Function            PT Goals (current goals can now be found in the care plan section) Acute Rehab PT Goals Patient Stated Goal: decreased pain PT Goal Formulation: With patient Time For Goal  Achievement: 02/04/16 Potential to Achieve Goals: Fair Progress towards PT goals: Not progressing toward goals - comment (due to gout flare up)    Frequency  Min 2X/week    PT Plan Current plan remains appropriate    Co-evaluation             End of Session Equipment Utilized During Treatment: Gait belt;Oxygen Activity Tolerance: Patient limited by pain Patient left: in chair;with call bell/phone within reach;with chair alarm set;with family/visitor present     Time: [id number] PT Time Calculation (min) (ACUTE ONLY): 29 min  Charges:  $Therapeutic Activity: 23-37 mins                    G Codes:       Collie Siad PT, DPT  Pager: [phone] Phone: [phone] 01/23/2016, 2:50 PM

## 2016-01-23 NOTE — Progress Notes (Signed)
PROGRESS NOTE  [name]  [id number] DOB: [birth year] DOA: 01/16/2016 PCP: [name], MD Outpatient Specialists:  Subjective:  Some left wrist pain this AM. Underwent open I and D by ortho 7/12. No SOB. No chest pain.   Brief Narrative:  80 year old AAM with past medical history of SSS S/P PPM, not controlled diabetes, history of prostate cancer came in with multifocal pneumonia, hemoptysis, left wrist septic arthritis and MSSA bacteremia. On Echo found to have pacer infection, being followed by cardiology and infectious disease.  Assessment & Plan:   Principal Problem:   Staphylococcus aureus bacteremia Active Problems:   PAF (paroxysmal atrial fibrillation) (HCC)   CKD (chronic kidney disease), stage IV (HCC)   Pacemaker   CAP (community acquired pneumonia)   Pneumonia   Hemoptysis   Pain   Wrist swelling   Staphylococcus aureus bacteremia - ID input is appreciated. For possible removal of pacemaker and TEE. -Patient presented with multifocal pneumonia likely the cause of the staph aureus bacteremia. -Per [facility] it's MSSA, started on cefazolin and rifampin. -2-D echocardiogram showed 78 mobile mass at the pacemaker wire. - cardiology was consulted today, EP will see. May require TEE.  Community acquired pneumonia -Multifocal pneumonia, initially started on Rocephin and vancomycin switched to cefazolin. -Supportive management with bronchodilators, mucolytics, antitussives and oxygen as needed.  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 - on appropriate abx per Dr. Megan Salon. -Cardiology consulted for further recommendation,? Pacemaker removal, Dr. Lovena Le .  Left wrist pain -Has swelling, warmth and tenderness, in the background of MSSA bacteremia and he has possible septic arthritis. - s/p incision by ortho, JP drain. On abx per ID. - crystals seen on OR fluid, no  organisms. Since renal function is worsening I have discontinued colchicine and started Prednisone 50mg  PO daily. Once symptoms start to improve this can be titrated down over the next 10-14 days.  Hemoptysis -Significant hemoptysis, patient is on aspirin, Plavix and CT without contrast showed multifocal pneumonia. -D-dimer elevated at 2.3, likely secondary to the pneumonia. -Unlikely to be PE, as he does not have tachycardia or hypoxia.  Hematuria -Reportedly gross hematuria, ultrasound done showed blood clot versus tumor in the bladder. -will need urology evaluation  SSS status post PPM -2-D echocardiogram showed 87 mm mobile mass in the right atrium attached to the pacer wire. -This is likely represent endocarditis. Cardiology to evaluate.  CKD stage IV -Creatinine at baseline of 3.8  Hypokalemia -Replete with oral supplements.   DVT prophylaxis:  Code Status: Full Code Family Communication:  Disposition Plan:  Diet: Diet heart healthy/carb modified Room service appropriate?: Yes; Fluid consistency:: Thin  Consultants:   ID  Cardiology  EP  Procedures:   Echo with mobile atrial mass  Antimicrobials:   Cefazolin   Rifampin   Objective: Filed Vitals:   01/22/16 2100 01/23/16 0247 01/23/16 0649 01/23/16 0853  BP: 126/54  119/61 154/68  Pulse: 72  70 85  Temp: 99 F (37.2 C)  98.9 F (37.2 C)   TempSrc: Oral  Oral   Resp: 18     Height:      Weight:  75.841 kg (167 lb 3.2 oz)    SpO2: 100%  98%     Intake/Output Summary (Last 24 hours) at 01/23/16 1141 Last data filed at 01/23/16 0200  Gross per 24 hour  Intake    460 ml  Output    504 ml  Net    -44 ml   Filed Weights   01/21/16 0342 01/22/16 0442 01/23/16 0247  Weight: 69.854 kg (154 lb) 73.211 kg (161 lb 6.4 oz) 75.841 kg (167 lb 3.2 oz)    Examination: General exam: Appears calm and comfortable  Respiratory system: Clear to auscultation. Respiratory effort normal. Cardiovascular system:  S1 & S2 heard, RRR. No JVD, murmurs, rubs, gallops or clicks. No pedal edema. Gastrointestinal system: Abdomen is nondistended, soft and nontender. No organomegaly or masses felt. Normal bowel sounds heard. Central nervous system: Alert and oriented. No focal neurological deficits. Extremities: Symmetric 5 x 5 power. Skin: No rashes, lesions or ulcers Psychiatry: Judgement and insight appear normal. Mood & affect appropriate.   Data Reviewed: I have personally reviewed following labs and imaging studies  CBC:  Recent Labs Lab 01/16/16 1239 01/16/16 1801 01/18/16 0923 01/19/16 0359 01/23/16 0239  WBC 13.1*  --  15.3* 13.3* 9.6  NEUTROABS 11.0*  --   --   --   --   HGB 9.7*  --  9.2* 8.8* 7.9*  HCT 30.4*  --  28.7* 26.5* 22.9*  MCV 102.4*  --  101.1* 97.8 92.3  PLT 100* 95* 108* 109* [id number]   Basic Metabolic Panel:  Recent Labs Lab 01/19/16 0359 01/20/16 0309 01/21/16 0314 01/22/16 0523 01/23/16 0239  NA 134* 135 132* 131* 128*  K 3.6 3.2* 4.3 3.9 3.7  CL 103 100* 100* 101 98*  CO2 22 23 24  20* 20*  GLUCOSE 135* 117* 128* 94 127*  BUN 84* 73* 71* 82* 83*  CREATININE 3.60* 3.02* 3.06* 3.51* 3.69*  CALCIUM 9.0 9.2 9.0 8.8* 8.5*   GFR: Estimated Creatinine Clearance: 17 mL/min (by C-G formula based on Cr of 3.69). Liver Function Tests:  Recent Labs Lab 01/16/16 1239  AST 30  ALT 12*  ALKPHOS 27*  BILITOT 0.7  PROT 7.6  ALBUMIN 4.2   No results for input(s): LIPASE, AMYLASE in the last 168 hours. No results for input(s): AMMONIA in the last 168 hours. Coagulation Profile:  Recent Labs Lab 01/16/16 1801 01/17/16 0424  INR 1.27 1.28   Cardiac Enzymes: No results for input(s): CKTOTAL, CKMB, CKMBINDEX, TROPONINI in the last 168 hours. BNP (last 3 results) No results for input(s): PROBNP in the last 8760 hours. HbA1C: No results for input(s): HGBA1C in the last 72 hours. CBG:  Recent Labs Lab 01/18/16 0634 01/23/16 1111  GLUCAP 132* 155*   Lipid  Profile: No results for input(s): CHOL, HDL, LDLCALC, TRIG, CHOLHDL, LDLDIRECT in the last 72 hours. Thyroid Function Tests: No results for input(s): TSH, T4TOTAL, FREET4, T3FREE, THYROIDAB in the last 72 hours. Anemia Panel: No results for input(s): VITAMINB12, FOLATE, FERRITIN, TIBC, IRON, RETICCTPCT in the last 72 hours. Urine analysis:    Component Value Date/Time   COLORURINE YELLOW 01/16/2016 1419   APPEARANCEUR CLOUDY* 01/16/2016 1419   LABSPEC 1.015 01/16/2016 1419   LABSPEC 1.010 05/19/2015 1231   PHURINE 5.5 01/16/2016 1419   PHURINE 6.0 05/19/2015 1231   GLUCOSEU NEGATIVE 01/16/2016 1419   GLUCOSEU Negative 05/19/2015 1231   HGBUR LARGE* 01/16/2016 1419   HGBUR Negative 05/19/2015 1231   BILIRUBINUR NEGATIVE 01/16/2016 1419   BILIRUBINUR Negative 05/19/2015 1231   KETONESUR NEGATIVE 01/16/2016 1419   KETONESUR Negative 05/19/2015 1231   PROTEINUR 100* 01/16/2016 1419   PROTEINUR < 30 05/19/2015 1231   UROBILINOGEN 0.2 05/19/2015 1231   UROBILINOGEN 0.2 04/03/2014 1911   NITRITE NEGATIVE 01/16/2016 1419   NITRITE Negative  05/19/2015 1231   LEUKOCYTESUR NEGATIVE 01/16/2016 1419   LEUKOCYTESUR Negative 05/19/2015 1231   Sepsis Labs:  Recent Results (from the past 240 hour(s))  Blood culture (routine x 2)     Status: Abnormal   Collection Time: 01/16/16  1:00 PM  Result Value Ref Range Status   Specimen Description LEFT ANTECUBITAL  Final   Special Requests BOTTLES DRAWN AEROBIC AND ANAEROBIC 10CC  Final   Culture  Setup Time   Final    GRAM POSITIVE COCCI IN CLUSTERS IN BOTH AEROBIC AND ANAEROBIC BOTTLES CRITICAL RESULT CALLED TO, READ BACK BY AND VERIFIED WITH: J MARKLE 01/17/16 @ 0927 M VESTAL    Culture (A)  Final    STAPHYLOCOCCUS AUREUS SUSCEPTIBILITIES PERFORMED ON PREVIOUS CULTURE WITHIN THE LAST 5 DAYS.    Report Status 01/19/2016 FINAL  Final  Blood culture (routine x 2)     Status: Abnormal   Collection Time: 01/16/16  1:06 PM  Result Value Ref  Range Status   Specimen Description BLOOD LEFT HAND  Final   Special Requests AEROBIC BOTTLE ONLY 10CC  Final   Culture  Setup Time   Final    GRAM POSITIVE COCCI IN CLUSTERS AEROBIC BOTTLE ONLY CRITICAL RESULT CALLED TO, READ BACK BY AND VERIFIED WITH: J MARKLE 01/17/16 @ 0927 M VESTAL    Culture STAPHYLOCOCCUS AUREUS (A)  Final   Report Status 01/19/2016 FINAL  Final   Organism ID, Bacteria STAPHYLOCOCCUS AUREUS  Final      Susceptibility   Staphylococcus aureus - MIC*    CIPROFLOXACIN >=8 RESISTANT Resistant     ERYTHROMYCIN <=0.25 SENSITIVE Sensitive     GENTAMICIN <=0.5 SENSITIVE Sensitive     OXACILLIN 0.5 SENSITIVE Sensitive     TETRACYCLINE <=1 SENSITIVE Sensitive     VANCOMYCIN <=0.5 SENSITIVE Sensitive     TRIMETH/SULFA <=10 SENSITIVE Sensitive     CLINDAMYCIN <=0.25 SENSITIVE Sensitive     RIFAMPIN <=0.5 SENSITIVE Sensitive     Inducible Clindamycin NEGATIVE Sensitive     * STAPHYLOCOCCUS AUREUS  Blood Culture ID Panel (Reflexed)     Status: Abnormal   Collection Time: 01/16/16  1:06 PM  Result Value Ref Range Status   Enterococcus species NOT DETECTED NOT DETECTED Final   Vancomycin resistance NOT DETECTED NOT DETECTED Final   Listeria monocytogenes NOT DETECTED NOT DETECTED Final   Staphylococcus species DETECTED (A) NOT DETECTED Final    Comment: CRITICAL RESULT CALLED TO, READ BACK BY AND VERIFIED WITH: J MARKLE 01/17/16 @ 0927 M VESTAL    Staphylococcus aureus DETECTED (A) NOT DETECTED Final    Comment: CRITICAL RESULT CALLED TO, READ BACK BY AND VERIFIED WITH: J MARKLE 01/17/16 @ 0927 M VESTAL    Methicillin resistance NOT DETECTED NOT DETECTED Final   Streptococcus species NOT DETECTED NOT DETECTED Final   Streptococcus agalactiae NOT DETECTED NOT DETECTED Final   Streptococcus pneumoniae NOT DETECTED NOT DETECTED Final   Streptococcus pyogenes NOT DETECTED NOT DETECTED Final   Acinetobacter baumannii NOT DETECTED NOT DETECTED Final   Enterobacteriaceae species  NOT DETECTED NOT DETECTED Final   Enterobacter cloacae complex NOT DETECTED NOT DETECTED Final   Escherichia coli NOT DETECTED NOT DETECTED Final   Klebsiella oxytoca NOT DETECTED NOT DETECTED Final   Klebsiella pneumoniae NOT DETECTED NOT DETECTED Final   Proteus species NOT DETECTED NOT DETECTED Final   Serratia marcescens NOT DETECTED NOT DETECTED Final   Carbapenem resistance NOT DETECTED NOT DETECTED Final   Haemophilus influenzae NOT DETECTED NOT  DETECTED Final   Neisseria meningitidis NOT DETECTED NOT DETECTED Final   Pseudomonas aeruginosa NOT DETECTED NOT DETECTED Final   Candida albicans NOT DETECTED NOT DETECTED Final   Candida glabrata NOT DETECTED NOT DETECTED Final   Candida krusei NOT DETECTED NOT DETECTED Final   Candida parapsilosis NOT DETECTED NOT DETECTED Final   Candida tropicalis NOT DETECTED NOT DETECTED Final  Culture, sputum-assessment     Status: None   Collection Time: 01/16/16 10:24 PM  Result Value Ref Range Status   Specimen Description EXPECTORATED SPUTUM  Final   Special Requests NONE  Final   Sputum evaluation   Final    THIS SPECIMEN IS ACCEPTABLE. RESPIRATORY CULTURE REPORT TO FOLLOW.   Report Status 01/17/2016 FINAL  Final  Culture, respiratory (NON-Expectorated)     Status: None   Collection Time: 01/16/16 10:24 PM  Result Value Ref Range Status   Specimen Description EXPECTORATED SPUTUM  Final   Special Requests NONE  Final   Gram Stain   Final    RARE SQUAMOUS EPITHELIAL CELLS PRESENT ABUNDANT WBC PRESENT,BOTH PMN AND MONONUCLEAR FEW GRAM POSITIVE COCCI IN PAIRS IN CLUSTERS    Culture Consistent with normal respiratory flora.  Final   Report Status 01/19/2016 FINAL  Final  Culture, blood (routine x 2)     Status: None   Collection Time: 01/17/16  9:50 AM  Result Value Ref Range Status   Specimen Description BLOOD BLOOD LEFT ARM  Final   Special Requests BOTTLES DRAWN AEROBIC ONLY 5CC  Final   Culture NO GROWTH 5 DAYS  Final   Report  Status 01/22/2016 FINAL  Final  Culture, blood (routine x 2)     Status: None   Collection Time: 01/17/16  9:50 AM  Result Value Ref Range Status   Specimen Description BLOOD LEFT ANTECUBITAL  Final   Special Requests BOTTLES DRAWN AEROBIC ONLY 5CC  Final   Culture NO GROWTH 5 DAYS  Final   Report Status 01/22/2016 FINAL  Final  Acid Fast Smear (AFB)     Status: None   Collection Time: 01/17/16 11:26 AM  Result Value Ref Range Status   AFB Specimen Processing Concentration  Final   Acid Fast Smear Negative  Final    Comment: (NOTE) Performed At: [facility] [place], [place] [id number] Lindon Romp MD [id number]    Source (AFB) SPUTUM  Final  Body fluid culture     Status: None   Collection Time: 01/19/16  4:37 PM  Result Value Ref Range Status   Specimen Description FLUID SYNOVIAL WRIST LEFT  Final   Special Requests NONE  Final   Gram Stain   Final    ABUNDANT WBC PRESENT,BOTH PMN AND MONONUCLEAR NO ORGANISMS SEEN    Culture NO GROWTH 3 DAYS  Final   Report Status 01/22/2016 FINAL  Final  Aerobic/Anaerobic Culture (surgical/deep wound)     Status: None (Preliminary result)   Collection Time: 01/21/16  6:52 PM  Result Value Ref Range Status   Specimen Description WOUND LEFT WRIST  Final   Special Requests NONE  Final   Gram Stain   Final    WBC PRESENT,BOTH PMN AND MONONUCLEAR NO ORGANISMS SEEN    Culture NO GROWTH < 24 HOURS  Final   Report Status PENDING  Incomplete     Invalid input(s): PROCALCITONIN, LACTICACIDVEN   Radiology Studies: No results found.      Scheduled Meds: . amiodarone  100 mg Oral Daily  .  atorvastatin  80 mg Oral QHS  . Calcifediol ER  30 mcg Oral QHS  . carvedilol  25 mg Oral BID WC  .  ceFAZolin (ANCEF) IV  2 g Intravenous Q12H  . cloNIDine  0.1 mg Oral BID  . hydrALAZINE  50 mg Oral TID  . isosorbide mononitrate  120 mg Oral Daily  . multivitamin with minerals  1 tablet Oral q morning - 10a  .  predniSONE  50 mg Oral Q breakfast  . rifampin  600 mg Oral Daily  . tamsulosin  0.4 mg Oral QPC supper   Continuous Infusions:     LOS: 7 days    Time spent: 33 minutes    Mir Marry Guan, MD [facility] Pager [phone]  If 7PM-7AM, please contact night-coverage [email or web address] Password [facility] 01/23/2016, 11:41 AM

## 2016-01-23 NOTE — Progress Notes (Addendum)
Notified ortho tech of order to remove dsg apply velcro wrist splint on at. 1900 to night.  Ordered by Dr. Cline Cools.  Instructed he will inform incoming shift.  Karie Kirks, Therapist, sports.

## 2016-01-24 DIAGNOSIS — I495 Sick sinus syndrome: Secondary | ICD-10-CM

## 2016-01-24 DIAGNOSIS — D469 Myelodysplastic syndrome, unspecified: Secondary | ICD-10-CM

## 2016-01-24 DIAGNOSIS — I25810 Atherosclerosis of coronary artery bypass graft(s) without angina pectoris: Secondary | ICD-10-CM

## 2016-01-24 DIAGNOSIS — I10 Essential (primary) hypertension: Secondary | ICD-10-CM

## 2016-01-24 DIAGNOSIS — R7881 Bacteremia: Secondary | ICD-10-CM

## 2016-01-24 DIAGNOSIS — I480 Paroxysmal atrial fibrillation: Secondary | ICD-10-CM

## 2016-01-24 DIAGNOSIS — N3289 Other specified disorders of bladder: Secondary | ICD-10-CM | POA: Diagnosis present

## 2016-01-24 DIAGNOSIS — M1189 Other specified crystal arthropathies, multiple sites: Secondary | ICD-10-CM

## 2016-01-24 DIAGNOSIS — I5032 Chronic diastolic (congestive) heart failure: Secondary | ICD-10-CM

## 2016-01-24 LAB — CBC
HCT: 23.1 % — ABNORMAL LOW (ref 39.0–52.0)
HEMOGLOBIN: 8.1 g/dL — AB (ref 13.0–17.0)
MCH: 32.1 pg (ref 26.0–34.0)
MCHC: 35.1 g/dL (ref 30.0–36.0)
MCV: 91.7 fL (ref 78.0–100.0)
PLATELETS: 212 10*3/uL (ref 150–400)
RBC: 2.52 MIL/uL — ABNORMAL LOW (ref 4.22–5.81)
RDW: 17.2 % — AB (ref 11.5–15.5)
WBC: 10 10*3/uL (ref 4.0–10.5)

## 2016-01-24 LAB — BASIC METABOLIC PANEL
ANION GAP: 9 (ref 5–15)
BUN: 100 mg/dL — AB (ref 6–20)
CALCIUM: 8.7 mg/dL — AB (ref 8.9–10.3)
CO2: 20 mmol/L — ABNORMAL LOW (ref 22–32)
CREATININE: 4.2 mg/dL — AB (ref 0.61–1.24)
Chloride: 102 mmol/L (ref 101–111)
GFR calc Af Amer: 14 mL/min — ABNORMAL LOW (ref >60)
GFR, EST NON AFRICAN AMERICAN: 12 mL/min — AB (ref >60)
GLUCOSE: 192 mg/dL — AB (ref 65–99)
Potassium: 4.2 mmol/L (ref 3.5–5.1)
Sodium: 131 mmol/L — ABNORMAL LOW (ref 135–145)

## 2016-01-24 LAB — GLUCOSE, CAPILLARY: Glucose-Capillary: 240 mg/dL — ABNORMAL HIGH (ref 65–99)

## 2016-01-24 MED ORDER — INSULIN ASPART 100 UNIT/ML ~~LOC~~ SOLN
0.0000 [IU] | Freq: Three times a day (TID) | SUBCUTANEOUS | Status: DC
  Administered 2016-01-25: 1 [IU] via SUBCUTANEOUS
  Administered 2016-01-25: 5 [IU] via SUBCUTANEOUS
  Administered 2016-01-26: 2 [IU] via SUBCUTANEOUS
  Administered 2016-01-26: 5 [IU] via SUBCUTANEOUS
  Administered 2016-01-26 – 2016-01-27 (×2): 2 [IU] via SUBCUTANEOUS
  Administered 2016-01-27: 3 [IU] via SUBCUTANEOUS
  Administered 2016-01-28: 1 [IU] via SUBCUTANEOUS
  Administered 2016-01-28 – 2016-01-29 (×2): 2 [IU] via SUBCUTANEOUS
  Administered 2016-01-29: 3 [IU] via SUBCUTANEOUS
  Administered 2016-01-30 – 2016-01-31 (×3): 2 [IU] via SUBCUTANEOUS
  Administered 2016-01-31: 1 [IU] via SUBCUTANEOUS
  Administered 2016-02-01: 3 [IU] via SUBCUTANEOUS
  Administered 2016-02-01 (×2): 1 [IU] via SUBCUTANEOUS
  Administered 2016-02-02: 2 [IU] via SUBCUTANEOUS
  Administered 2016-02-03: 3 [IU] via SUBCUTANEOUS

## 2016-01-24 MED ORDER — INSULIN ASPART 100 UNIT/ML ~~LOC~~ SOLN
3.0000 [IU] | Freq: Three times a day (TID) | SUBCUTANEOUS | Status: DC
  Administered 2016-01-25 – 2016-01-26 (×3): 3 [IU] via SUBCUTANEOUS

## 2016-01-24 MED ORDER — INSULIN ASPART 100 UNIT/ML ~~LOC~~ SOLN
0.0000 [IU] | Freq: Every day | SUBCUTANEOUS | Status: DC
  Administered 2016-01-24 – 2016-01-25 (×2): 2 [IU] via SUBCUTANEOUS
  Administered 2016-01-27: 3 [IU] via SUBCUTANEOUS
  Administered 2016-01-29 – 2016-02-01 (×2): 2 [IU] via SUBCUTANEOUS

## 2016-01-24 NOTE — Progress Notes (Signed)
   SUBJECTIVE: The patient remains quite ill.   At this time, he denies chest pain, shortness of breath, or any new concerns.  Marland Kitchen amiodarone  100 mg Oral Daily  . atorvastatin  80 mg Oral QHS  . Calcifediol ER  30 mcg Oral QHS  . carvedilol  25 mg Oral BID WC  .  ceFAZolin (ANCEF) IV  2 g Intravenous Q12H  . cloNIDine  0.1 mg Oral BID  . hydrALAZINE  50 mg Oral TID  . isosorbide mononitrate  120 mg Oral Daily  . multivitamin with minerals  1 tablet Oral q morning - 10a  . predniSONE  50 mg Oral Q breakfast  . rifampin  600 mg Oral Daily  . tamsulosin  0.4 mg Oral QPC supper      OBJECTIVE: Physical Exam: Filed Vitals:   01/23/16 1719 01/23/16 2100 01/24/16 0416 01/24/16 1036  BP: 137/61 118/55 143/63 152/59  Pulse:  77 74 84  Temp:  98.6 F (37 C) 98.7 F (37.1 C)   TempSrc:  Oral Oral   Resp:  20 18   Height:      Weight:   170 lb 14.4 oz (77.52 kg)   SpO2:  98% 98%     Intake/Output Summary (Last 24 hours) at 01/24/16 1147 Last data filed at 01/24/16 0857  Gross per 24 hour  Intake    660 ml  Output    300 ml  Net    360 ml   GEN- The patient is ill appearing, sleeping but rouses Head- normocephalic, atraumatic Eyes-  Sclera clear, conjunctiva pink Ears- hearing intact Oropharynx- clear Neck- supple,   Lungs- decreased BS at bases, normal work of breathing Heart- Regular rate and rhythm  GI- soft, NT, ND, + BS Extremities- no clubbing, cyanosis, or edema, L wrist and hand are markedly swollen   LABS: Basic Metabolic Panel:  Recent Labs  01/23/16 0239 01/24/16 0317  NA 128* 131*  K 3.7 4.2  CL 98* 102  CO2 20* 20*  GLUCOSE 127* 192*  BUN 83* 100*  CREATININE 3.69* 4.20*  CALCIUM 8.5* 8.7*   Liver Function Tests: No results for input(s): AST, ALT, ALKPHOS, BILITOT, PROT, ALBUMIN in the last 72 hours. No results for input(s): LIPASE, AMYLASE in the last 72 hours. CBC:  Recent Labs  01/23/16 0239 01/24/16 0317  WBC 9.6 10.0  HGB 7.9* 8.1*    HCT 22.9* 23.1*  MCV 92.3 91.7  PLT 169 212    ASSESSMENT AND PLAN:    1. MSSA bacteremia   TEE pending.  Hopefully spot was held in endoscopy for Monday.  Would make NPO after midnight on Sunday.       Per Dr Lovena Le, anticipate device extraction on Tuesday     ID managing antibiotics  2. SSSx, PAFib w/PPM  He is in SR  CHA2DS2Vasc is at least 4, not on a/c with hx of GIB   3. Acute on Chronic KD     Primary team to manage   Pt is quite ill with multiple organ illness.  His prognosis is guarded at best. EP to see as needed over the weekend.  Dr Lovena Le to see again on Monday.   Thompson Grayer, MD 01/24/2016 11:47 AM

## 2016-01-24 NOTE — Progress Notes (Signed)
Progress Note    [name]  [id number] DOB: 1935-11-08  DOA: 01/16/2016 PCP: [name], MD    Brief Narrative:   [name] Cruse. is an 80 y.o. male with a PMH of paroxysmal atrial fibrillation/pacemaker not on anticoagulation secondary to history of GI bleed, CAD, stage IV CKD, CAD, hypertension,Liddle's syndrome, myelodysplastic syndrome, PVD and hyperlipidemia who was admitted 01/16/16 with multifocal pneumonia complicated by coagulopathy and hematuria. Blood cultures were positive for MSSA. Patient subsequently developed severe wrist pain and underwent arthrotomy of left wrist joint 01/21/16. Pseudogout suspected given negative synovial fluid cultures. Echocardiogram done which showed echodensity on RA lead.  Assessment/Plan:   Principal Problem:   Staphylococcus aureus bacteremia with pacemaker endocarditis, possible septic arthritis and right lower lobe pneumonia/Multifocal pneumonia Will need pacemaker extraction. Per ID recs: Current guidelines state that he should be safe to replace a new pacemaker 14 days after the first negative blood culture (that would be 01/31/2016). He will need a total of 4 weeks of antibiotic therapy through 02/12/2016. Will consult interventional radiology to have a cuffed central catheter placed because of his chronic kidney disease and wanting to avoid a PICC. Follow-up surveillance cultures were negative. Dr. Lovena Le following. Continue cefazolin and rifampin.TEE planned for 01/26/16.  Active Problems:   Bladder mass Renal ultrasound done 01/17/16. 5 cm masslike area consistent with a blood clot. Bladder tumor could not be completely ruled out however and the patient will need a urological evaluation nonurgently.    Coronary artery disease/chronic diastolic CHF  Status post CABG. Aspirin/Plavix currently on hold secondary to hematuria and hemoptysis. Continue Lipitor, Imdur and Coreg. No evidence of heart failure exacerbation.  Diuretics currently on hold. Monitor daily weights and I/of.    PAF (paroxysmal atrial fibrillation) (HCC) status post pacemaker for sick sinus syndrome CHA2DS2Vasc is at least 4, but not felt to be a candidate for anticoagulation given history of GI bleeding. Continue amiodarone.    CKD (chronic kidney disease), stage IV (HCC)/renal artery stenosis Baseline creatinine 3.8.    Wrist swelling secondary to probable pseudogout, synovial fluid cultures pending Seen by orthopedics, status post incision and drainage 01/21/16. Operative synovial fluid Gram stain was negative for organisms. CPPD crystals noted. Findings most consistent with pseudogout. Continue prednisone. Follow-up cultures. Will follow-up with Dr. Grandville Silos.    Liddle's syndrome Monitor for hypokalemia and high blood pressure.    Myelodysplastic syndrome/anemia of chronic disease Gets Epogen injections every 2 weeks at the cancer center.    Diabetes mellitus with peripheral artery disease Diet controlled. CBGs 132-266. Start SSI, insulin sensitive scale with 3 units of meal coverage. Steroids may make glycemic control harder to achieve.    Gastroparesis    Hypertension Continue Coreg and clonidine. Lasix and spironolactone on hold.    Prostate cancer Gets Lupron injections every 4 months.   Family Communication/Anticipated D/C date and plan/Code Status   DVT prophylaxis: SCDs ordered. Code Status: Full Code.  Family Communication: Son, Juanda Crumble at the bedside. Disposition Plan: Home when medically stable, likely several more days given current sepsis/pacemaker endocarditis.   Medical Consultants:    Cardiology  Orthopedic Surgery  Infectious Disease   Procedures:   2 D Echo 01/24/16:  Dynamic obstruction with peak velocity of 351 cm/sec. no regional wall motion abnormalities. Grade 1 diastolic dysfunction. Mobile echodensity in the right atrium attached to pacer wire. Mild systolic pulmonary artery  hypertension.  Dg Chest 2 View  01/16/2016  CLINICAL DATA:  Chest pain,  cough for 2 days EXAM: CHEST  2 VIEW COMPARISON:  CT chest 04/03/2014 FINDINGS: Cardiomediastinal silhouette is unremarkable. Status post median sternotomy. Dual lead cardiac pacemaker with leads in right atrium and right ventricle. There is streaky airspace opacification in right base infrahilar region and right upper lobe peripheral perihilar. Findings highly suspicious for pneumonia. Probable small right pleural effusion. Left lung is clear. IMPRESSION: There is streaky airspace opacification in right base infrahilar region and right upper lobe peripheral perihilar. Findings highly suspicious for pneumonia. Followup PA and lateral chest X-ray is recommended in 3-4 weeks following trial of antibiotic therapy to ensure resolution and exclude underlying malignancy. Probable small right pleural effusion. Electronically Signed   By: Lahoma Crocker M.D.   On: 01/16/2016 13:58   Dg Wrist 2 Views Left  01/18/2016  CLINICAL DATA:  Acute onset of left wrist pain.  Initial encounter. EXAM: LEFT WRIST - 2 VIEW COMPARISON:  None. FINDINGS: There is no evidence of fracture or dislocation. There is joint space narrowing along the radiocarpal joint, and at the radial aspect of the carpal rows. This resolves in mild chronic deformity of the scaphoid and lunate, with expansion of the scapholunate distance to 5 mm. Scattered vascular calcifications are seen. No additional soft tissue abnormalities are characterized on radiograph. IMPRESSION: 1. No evidence of fracture or dislocation. 2. Degenerative joint space narrowing along the radiocarpal joints, and at the radial aspect of the carpal rows. This results in mild chronic deformity of the scaphoid and lunate, with expansion of the scapholunate distance to 5 mm, reflecting scapholunate dissociation. Electronically Signed   By: Garald Balding M.D.   On: 01/18/2016 16:57   Ct Chest Wo Contrast  01/16/2016   CLINICAL DATA:  Hemoptysis, cough.  Symptoms since Monday. EXAM: CT CHEST WITHOUT CONTRAST TECHNIQUE: TECHNIQUE Multidetector CT imaging of the chest was performed following the standard protocol without IV contrast. COMPARISON:  Chest radiograph 01/16/2016, CT 04/03/2014 FINDINGS: Cardiovascular: Calcification of the thoracic aorta. Coronary bypass graft. Pacer wires in the RIGHT heart. No pericardial fluid. Mediastinum/Nodes: No axillary or supraclavicular adenopathy. No mediastinal hilar adenopathy. Esophagus normal. Lungs/Pleura: There is consolidative airspace disease in the RIGHT lower lobe with air bronchograms. A similar pattern in the posterior aspect the RIGHT upper lobe. Smaller pattern of consolidation in the LEFT lower lobe. Findings are consistent with multifocal pneumonia. Upper Abdomen: Limited view of the liver, kidneys, pancreas are unremarkable. Normal adrenal glands. Musculoskeletal: No aggressive osseous lesion.  Midline sternotomy. IMPRESSION: 1. Multifocal pneumonia with most dense infection in RIGHT lower lobe. 2. Coronary artery calcification and aortic atherosclerotic calcification. Electronically Signed   By: Suzy Bouchard M.D.   On: 01/16/2016 16:32   US Renal  01/17/2016  CLINICAL DATA:  80 year old male with microscopic hematuria. Personal history of prostate cancer. Initial encounter. EXAM: RENAL / URINARY TRACT ULTRASOUND COMPLETE COMPARISON:  CT Abdomen and Pelvis 08/30/2015, and earlier. Right upper quadrant ultrasound from today reported separately. FINDINGS: Right Kidney: Length: 12.3 cm. Echogenicity within normal limits. No mass or hydronephrosis visualized. Left Kidney: Length: 12.0 cm. Echogenicity within normal limits. No mass or hydronephrosis visualized. Bladder: 5 cm area which is dependent, complex, and mass like (image 15), however, there is no internal vascularity detected with brief color Doppler interrogation. Elsewhere the urinary bladder appears normal.  Prostate measured to be 5.2 x 3.1 x 4.3 cm. IMPRESSION: 1. Abnormal 5 cm complex, mass-like area of dependent echogenicity in the urinary bladder. However, lack of internal vascularity on Doppler suggests this  is more likely blood clot rather than a bladder tumor. Further evaluation recommended. 2. Normal for age sonographic appearance of both kidneys. Electronically Signed   By: Genevie Ann M.D.   On: 01/17/2016 08:01   US Renal  01/09/2016  CLINICAL DATA:  Two weeks of hematuria ; history of prostate enlargement EXAM: RENAL / URINARY TRACT ULTRASOUND COMPLETE COMPARISON:  Abdominal pelvic CT scan of July 30, 2015 FINDINGS: Right Kidney: Length: 11.5 cm. The renal cortical echotexture is equal to or slightly greater than that of the adjacent liver. There is no hydronephrosis nor discrete mass. Left Kidney: Length: 10.3 cm. The renal cortical echotexture is mildly increased similar to that on the right. There is no hydronephrosis. Bladder: There is an irregular mass in the posterior aspect of the urinary bladder wall measuring 2.5 x 2 x 4.4 cm. This appears separate from the more anteriorly and inferiorly positioned prostate gland. The prostate gland itself does produce a mild impression upon the urinary bladder base. IMPRESSION: 1. Posterior bladder wall mass worrisome for malignancy. This is separate from the mildly enlarged prostate gland. 2. Increased renal cortical echotexture bilaterally consistent with medical renal disease. There is no hydronephrosis. Electronically Signed   By: David  Martinique M.D.   On: 01/09/2016 14:44   Dg Fluoro Guided Needle Plc Aspiration/injection Loc  01/21/2016  CLINICAL DATA:  Left wrist pain and swelling.  Sepsis. EXAM: LEFT WRIST ASPIRATION UNDER FLUOROSCOPY FLUOROSCOPY TIME:  Fluoroscopy Time (in minutes and seconds): 24 SECONDS PROCEDURE: Overlying skin prepped with Betadine, draped in the usual sterile fashion, and infiltrated locally with buffered Lidocaine. 20 gauge  needle advanced into the radiocarpal joint under direct fluoroscopic visualization. Approximately 3 cc of bloody fluid was aspirated from the joint and overlying soft tissues. IMPRESSION: Left radiocarpal joint aspiration performed without immediate complication. Mr. Zilliox with tolerated the procedure well. Technically successful  hip injection under fluoroscopy. Electronically Signed   By: Lorriane Shire M.D.   On: 01/21/2016 08:10   US Abdomen Limited Ruq  01/17/2016  CLINICAL DATA:  80 year old male with abnormal LFTs. Prior cholecystectomy. Pneumonia. Initial encounter. EXAM: US ABDOMEN LIMITED - RIGHT UPPER QUADRANT COMPARISON:  Noncontrast chest CT 01/16/2016. CT Abdomen and Pelvis 08/30/2015 and earlier FINDINGS: Gallbladder: Surgically absent Common bile duct: Diameter: 6 mm, normal Liver: Chronic 2.6 cm left hepatic lobe cyst (image 18) does demonstrate mild wall thickening, however, this lesion is stable on multiple prior CTs back to 2011. Similar 3.1 cm right lobe cyst, also not significantly changed since 2011. Underlying hepatic echotexture is within normal limits. No intrahepatic biliary ductal dilatation is evident. No new liver lesion identified. Other findings: Negative visible right kidney. No free fluid identified. IMPRESSION: No acute liver or biliary findings. 2-3 cm liver cysts which are mildly thick walled but benign, having not significantly changed since 2011. Electronically Signed   By: Genevie Ann M.D.   On: 01/17/2016 07:56    Anti-Infectives:   Azithromycin 01/16/16---> 01/17/16 Rocephin 01/16/16---> 01/17/16 Ancef 01/17/16---> Rifampin 01/17/16 --->  Subjective:   [name] D Mungin Sr. complains of generalized feeling of unwellness. Says that he is dizzy. Denies nausea. Left hand pain persists.   Objective:    Filed Vitals:   01/23/16 1400 01/23/16 1719 01/23/16 2100 01/24/16 0416  BP: 123/53 137/61 118/55 143/63  Pulse: 80  77 74  Temp: 99 F (37.2 C)  98.6 F (37 C) 98.7 F  (37.1 C)  TempSrc: Oral  Oral Oral  Resp: 22  20 18  Height:      Weight:    77.52 kg (170 lb 14.4 oz)  SpO2: 100%  98% 98%    Intake/Output Summary (Last 24 hours) at 01/24/16 0924 Last data filed at 01/24/16 0857  Gross per 24 hour  Intake    660 ml  Output    300 ml  Net    360 ml   Filed Weights   01/22/16 0442 01/23/16 0247 01/24/16 0416  Weight: 73.211 kg (161 lb 6.4 oz) 75.841 kg (167 lb 3.2 oz) 77.52 kg (170 lb 14.4 oz)    Exam: General exam: Appears uncomfortable. Respiratory system: Clear to auscultation. Respiratory effort normal. Cardiovascular system: S1 & S2 heard, RRR. No JVD,  rubs, gallops or clicks. No murmurs. Gastrointestinal system: Abdomen is nondistended, soft and nontender. No organomegaly or masses felt. Normal bowel sounds heard. Central nervous system: Alert and oriented. No focal neurological deficits. Extremities: No clubbing, edema, or cyanosis. Skin: No rashes, lesions or ulcers Psychiatry: Judgement and insight appear normal. Mood & affect appropriate.   Data Reviewed:   I have personally reviewed following labs and imaging studies:  Labs: Basic Metabolic Panel:  Recent Labs Lab 01/20/16 0309 01/21/16 0314 01/22/16 0523 01/23/16 0239 01/24/16 0317  NA 135 132* 131* 128* 131*  K 3.2* 4.3 3.9 3.7 4.2  CL 100* 100* 101 98* 102  CO2 23 24 20* 20* 20*  GLUCOSE 117* 128* 94 127* 192*  BUN 73* 71* 82* 83* 100*  CREATININE 3.02* 3.06* 3.51* 3.69* 4.20*  CALCIUM 9.2 9.0 8.8* 8.5* 8.7*   GFR Estimated Creatinine Clearance: 15 mL/min (by C-G formula based on Cr of 4.2). Liver Function Tests: No results for input(s): AST, ALT, ALKPHOS, BILITOT, PROT, ALBUMIN in the last 168 hours. No results for input(s): LIPASE, AMYLASE in the last 168 hours. No results for input(s): AMMONIA in the last 168 hours. Coagulation profile No results for input(s): INR, PROTIME in the last 168 hours.  CBC:  Recent Labs Lab 01/18/16 0923 01/19/16 0359  01/23/16 0239 01/24/16 0317  WBC 15.3* 13.3* 9.6 10.0  HGB 9.2* 8.8* 7.9* 8.1*  HCT 28.7* 26.5* 22.9* 23.1*  MCV 101.1* 97.8 92.3 91.7  PLT 108* 109* 169 212   Cardiac Enzymes: No results for input(s): CKTOTAL, CKMB, CKMBINDEX, TROPONINI in the last 168 hours. BNP (last 3 results) No results for input(s): PROBNP in the last 8760 hours. CBG:  Recent Labs Lab 01/18/16 0634 01/23/16 1111 01/23/16 1637 01/23/16 2243  GLUCAP 132* 155* 228* 266*   Microbiology Recent Results (from the past 240 hour(s))  Blood culture (routine x 2)     Status: Abnormal   Collection Time: 01/16/16  1:00 PM  Result Value Ref Range Status   Specimen Description LEFT ANTECUBITAL  Final   Special Requests BOTTLES DRAWN AEROBIC AND ANAEROBIC 10CC  Final   Culture  Setup Time   Final    GRAM POSITIVE COCCI IN CLUSTERS IN BOTH AEROBIC AND ANAEROBIC BOTTLES CRITICAL RESULT CALLED TO, READ BACK BY AND VERIFIED WITH: J MARKLE 01/17/16 @ 0927 M VESTAL    Culture (A)  Final    STAPHYLOCOCCUS AUREUS SUSCEPTIBILITIES PERFORMED ON PREVIOUS CULTURE WITHIN THE LAST 5 DAYS.    Report Status 01/19/2016 FINAL  Final  Blood culture (routine x 2)     Status: Abnormal   Collection Time: 01/16/16  1:06 PM  Result Value Ref Range Status   Specimen Description BLOOD LEFT HAND  Final   Special Requests AEROBIC BOTTLE ONLY  10CC  Final   Culture  Setup Time   Final    GRAM POSITIVE COCCI IN CLUSTERS AEROBIC BOTTLE ONLY CRITICAL RESULT CALLED TO, READ BACK BY AND VERIFIED WITH: J MARKLE 01/17/16 @ 0927 M VESTAL    Culture STAPHYLOCOCCUS AUREUS (A)  Final   Report Status 01/19/2016 FINAL  Final   Organism ID, Bacteria STAPHYLOCOCCUS AUREUS  Final      Susceptibility   Staphylococcus aureus - MIC*    CIPROFLOXACIN >=8 RESISTANT Resistant     ERYTHROMYCIN <=0.25 SENSITIVE Sensitive     GENTAMICIN <=0.5 SENSITIVE Sensitive     OXACILLIN 0.5 SENSITIVE Sensitive     TETRACYCLINE <=1 SENSITIVE Sensitive     VANCOMYCIN  <=0.5 SENSITIVE Sensitive     TRIMETH/SULFA <=10 SENSITIVE Sensitive     CLINDAMYCIN <=0.25 SENSITIVE Sensitive     RIFAMPIN <=0.5 SENSITIVE Sensitive     Inducible Clindamycin NEGATIVE Sensitive     * STAPHYLOCOCCUS AUREUS  Blood Culture ID Panel (Reflexed)     Status: Abnormal   Collection Time: 01/16/16  1:06 PM  Result Value Ref Range Status   Enterococcus species NOT DETECTED NOT DETECTED Final   Vancomycin resistance NOT DETECTED NOT DETECTED Final   Listeria monocytogenes NOT DETECTED NOT DETECTED Final   Staphylococcus species DETECTED (A) NOT DETECTED Final    Comment: CRITICAL RESULT CALLED TO, READ BACK BY AND VERIFIED WITH: J MARKLE 01/17/16 @ 0927 M VESTAL    Staphylococcus aureus DETECTED (A) NOT DETECTED Final    Comment: CRITICAL RESULT CALLED TO, READ BACK BY AND VERIFIED WITH: J MARKLE 01/17/16 @ 0927 M VESTAL    Methicillin resistance NOT DETECTED NOT DETECTED Final   Streptococcus species NOT DETECTED NOT DETECTED Final   Streptococcus agalactiae NOT DETECTED NOT DETECTED Final   Streptococcus pneumoniae NOT DETECTED NOT DETECTED Final   Streptococcus pyogenes NOT DETECTED NOT DETECTED Final   Acinetobacter baumannii NOT DETECTED NOT DETECTED Final   Enterobacteriaceae species NOT DETECTED NOT DETECTED Final   Enterobacter cloacae complex NOT DETECTED NOT DETECTED Final   Escherichia coli NOT DETECTED NOT DETECTED Final   Klebsiella oxytoca NOT DETECTED NOT DETECTED Final   Klebsiella pneumoniae NOT DETECTED NOT DETECTED Final   Proteus species NOT DETECTED NOT DETECTED Final   Serratia marcescens NOT DETECTED NOT DETECTED Final   Carbapenem resistance NOT DETECTED NOT DETECTED Final   Haemophilus influenzae NOT DETECTED NOT DETECTED Final   Neisseria meningitidis NOT DETECTED NOT DETECTED Final   Pseudomonas aeruginosa NOT DETECTED NOT DETECTED Final   Candida albicans NOT DETECTED NOT DETECTED Final   Candida glabrata NOT DETECTED NOT DETECTED Final   Candida  krusei NOT DETECTED NOT DETECTED Final   Candida parapsilosis NOT DETECTED NOT DETECTED Final   Candida tropicalis NOT DETECTED NOT DETECTED Final  Culture, sputum-assessment     Status: None   Collection Time: 01/16/16 10:24 PM  Result Value Ref Range Status   Specimen Description EXPECTORATED SPUTUM  Final   Special Requests NONE  Final   Sputum evaluation   Final    THIS SPECIMEN IS ACCEPTABLE. RESPIRATORY CULTURE REPORT TO FOLLOW.   Report Status 01/17/2016 FINAL  Final  Culture, respiratory (NON-Expectorated)     Status: None   Collection Time: 01/16/16 10:24 PM  Result Value Ref Range Status   Specimen Description EXPECTORATED SPUTUM  Final   Special Requests NONE  Final   Gram Stain   Final    RARE SQUAMOUS EPITHELIAL CELLS PRESENT ABUNDANT WBC PRESENT,BOTH  PMN AND MONONUCLEAR FEW GRAM POSITIVE COCCI IN PAIRS IN CLUSTERS    Culture Consistent with normal respiratory flora.  Final   Report Status 01/19/2016 FINAL  Final  Culture, blood (routine x 2)     Status: None   Collection Time: 01/17/16  9:50 AM  Result Value Ref Range Status   Specimen Description BLOOD BLOOD LEFT ARM  Final   Special Requests BOTTLES DRAWN AEROBIC ONLY 5CC  Final   Culture NO GROWTH 5 DAYS  Final   Report Status 01/22/2016 FINAL  Final  Culture, blood (routine x 2)     Status: None   Collection Time: 01/17/16  9:50 AM  Result Value Ref Range Status   Specimen Description BLOOD LEFT ANTECUBITAL  Final   Special Requests BOTTLES DRAWN AEROBIC ONLY 5CC  Final   Culture NO GROWTH 5 DAYS  Final   Report Status 01/22/2016 FINAL  Final  Acid Fast Smear (AFB)     Status: None   Collection Time: 01/17/16 11:26 AM  Result Value Ref Range Status   AFB Specimen Processing Concentration  Final   Acid Fast Smear Negative  Final    Comment: (NOTE) Performed At: [facility] [place], [place] [id number] Lindon Romp MD [id number]    Source (AFB) SPUTUM  Final  Body fluid  culture     Status: None   Collection Time: 01/19/16  4:37 PM  Result Value Ref Range Status   Specimen Description FLUID SYNOVIAL WRIST LEFT  Final   Special Requests NONE  Final   Gram Stain   Final    ABUNDANT WBC PRESENT,BOTH PMN AND MONONUCLEAR NO ORGANISMS SEEN    Culture NO GROWTH 3 DAYS  Final   Report Status 01/22/2016 FINAL  Final  Aerobic/Anaerobic Culture (surgical/deep wound)     Status: None (Preliminary result)   Collection Time: 01/21/16  6:52 PM  Result Value Ref Range Status   Specimen Description WOUND LEFT WRIST  Final   Special Requests NONE  Final   Gram Stain   Final    WBC PRESENT,BOTH PMN AND MONONUCLEAR NO ORGANISMS SEEN    Culture   Final    NO GROWTH 2 DAYS NO ANAEROBES ISOLATED; CULTURE IN PROGRESS FOR 5 DAYS   Report Status PENDING  Incomplete    Radiology: No results found.  Medications:   . amiodarone  100 mg Oral Daily  . atorvastatin  80 mg Oral QHS  . Calcifediol ER  30 mcg Oral QHS  . carvedilol  25 mg Oral BID WC  .  ceFAZolin (ANCEF) IV  2 g Intravenous Q12H  . cloNIDine  0.1 mg Oral BID  . hydrALAZINE  50 mg Oral TID  . isosorbide mononitrate  120 mg Oral Daily  . multivitamin with minerals  1 tablet Oral q morning - 10a  . predniSONE  50 mg Oral Q breakfast  . rifampin  600 mg Oral Daily  . tamsulosin  0.4 mg Oral QPC supper   Continuous Infusions:   Time spent: 35 minutes.  The patient is medically complex with multiple co-morbidities and is at high risk for clinical deterioration and requires high complexity decision making.    LOS: 8 days   [facility] Pager [phone]. If unable to reach me by pager, please call my cell phone at [phone].  *Please refer to [email or web address], password [email or web address] to get updated schedule on who will round on this patient, as hospitalists switch teams weekly. If  7PM-7AM, please contact night-coverage at [email or web address], password [email or web address] for any overnight needs.  01/24/2016, 9:24 AM

## 2016-01-24 NOTE — Progress Notes (Signed)
Referring Physician(s): Dr Altha Harm Rama  Supervising Physician: Sandi Mariscal  Patient Status:  Inpatient  Chief Complaint:  Bacteremia  Subjective: Staph Aureus Bacteremia PNA Pacemaker endocarditis CKD Cr 4.2 today; worsening Need for long term  IV antibiotics No candidate for PICC with CKD Request made for tunneled central catheter placement  Allergies: Nsaids; Ibuprofen; and Ace inhibitors  Medications: Prior to Admission medications   Medication Sig Start Date End Date Taking? Authorizing Provider  acetaminophen (ARTHRITIS PAIN RELIEF) 650 MG CR tablet Take 1,300 mg by mouth 3 (three) times daily.    Yes Historical Provider, MD  amiodarone (PACERONE) 200 MG tablet Take one-half tablet by  mouth daily 01/07/16  Yes Belva Crome, MD  aspirin 81 MG tablet Take 1 tablet (81 mg total) by mouth daily. 09/10/14  Yes Belva Crome, MD  atorvastatin (LIPITOR) 80 MG tablet Take 80 mg by mouth at bedtime.    Yes Historical Provider, MD  Calcifediol ER (RAYALDEE) 30 MCG CPCR Take 30 mcg by mouth at bedtime.   Yes Historical Provider, MD  CALCIUM-VITAMIN D PO Take 1 capsule by mouth 2 (two) times daily.   Yes Historical Provider, MD  carvedilol (COREG) 25 MG tablet TAKE 1 TABLET BY MOUTH  TWICE DAILY WITH MEALS 01/07/16  Yes Belva Crome, MD  cloNIDine (CATAPRES) 0.1 MG tablet Take 0.1 mg by mouth 2 (two) times daily. 02/19/15  Yes Historical Provider, MD  clopidogrel (PLAVIX) 75 MG tablet Take 1 tablet by mouth  daily 01/07/16  Yes Belva Crome, MD  cyanocobalamin (,VITAMIN B-12,) 1000 MCG/ML injection Inject 1,000 mcg into the muscle every 30 (thirty) days. Vitamin B12 - last injection 09/01/15   Yes Historical Provider, MD  epoetin alfa (EPOGEN,PROCRIT) 60454 UNIT/ML injection Inject 10,000 Units into the skin every 14 (fourteen) days. Done at [facility] - last injection 09/22/15   Yes Historical Provider, MD  furosemide (LASIX) 80 MG tablet Take 80 mg by mouth 2 (two) times  daily. Pt takes 1 and 1/2 tablet daily. (140 mg) total   Yes Historical Provider, MD  hydrALAZINE (APRESOLINE) 50 MG tablet Take 1 tablet (50 mg total) by mouth 3 (three) times daily. 11/23/13  Yes Belva Crome, MD  isosorbide mononitrate (IMDUR) 120 MG 24 hr tablet Take 1 tablet by mouth  daily 01/07/16  Yes Belva Crome, MD  Multiple Vitamin (MULTIVITAMIN WITH MINERALS) TABS tablet Take 1 tablet by mouth every morning.   Yes Historical Provider, MD  pantoprazole (PROTONIX) 40 MG tablet Take 1 tablet by mouth daily. 01/14/16  Yes Historical Provider, MD  potassium chloride SA (K-DUR,KLOR-CON) 20 MEQ tablet Take 20-40 mEq by mouth 3 (three) times daily. Take 2 tablets (40 meq) by mouth with breakfast, take 1 tablet (20 meq) with lunch and supper 06/21/13  Yes Belva Crome, MD  [place]   Yes Historical Provider, MD  silodosin (RAPAFLO) 8 MG CAPS capsule Take 8 mg by mouth at bedtime.    Yes Historical Provider, MD  spironolactone (ALDACTONE) 25 MG tablet Take 25 mg by mouth daily.   Yes Historical Provider, MD  leuprolide (LUPRON) 30 MG injection Inject 30 mg into the muscle every 4 (four) months.    Historical Provider, MD     Vital Signs: BP 93/50 mmHg  Pulse 70  Temp(Src) 98.3 F (36.8 C) (Oral)  Resp 20  Ht 5\' 10"  (1.778 m)  Wt 170 lb 14.4 oz (77.52 kg)  BMI 24.52 kg/m2  SpO2 98%  Physical Exam  Constitutional: He is oriented to person, place, and time.  Cardiovascular: Normal rate.   Murmur heard. Pulmonary/Chest: Effort normal and breath sounds normal. He has no wheezes.  Neurological: He is alert and oriented to person, place, and time.  Skin: Skin is warm.  Nursing note and vitals reviewed.   Imaging: No results found.  Labs:  CBC:  Recent Labs  01/18/16 0923 01/19/16 0359 01/23/16 0239 01/24/16 0317  WBC 15.3* 13.3* 9.6 10.0  HGB 9.2* 8.8* 7.9* 8.1*  HCT 28.7* 26.5* 22.9* 23.1*  PLT 108* 109* 169 212     COAGS:  Recent Labs  01/16/16 1801 01/17/16 0424  INR 1.27 1.28  APTT 35  --     BMP:  Recent Labs  01/21/16 0314 01/22/16 0523 01/23/16 0239 01/24/16 0317  NA 132* 131* 128* 131*  K 4.3 3.9 3.7 4.2  CL 100* 101 98* 102  CO2 24 20* 20* 20*  GLUCOSE 128* 94 127* 192*  BUN 71* 82* 83* 100*  CALCIUM 9.0 8.8* 8.5* 8.7*  CREATININE 3.06* 3.51* 3.69* 4.20*  GFRNONAA 18* 15* 14* 12*  GFRAA 21* 18* 17* 14*    LIVER FUNCTION TESTS:  Recent Labs  08/29/15 1955 10/17/15 0845 11/07/15 1440 01/16/16 1239  BILITOT 0.4 0.6 0.4 0.7  AST 20 17 16 30   ALT 9* 6* 5* 12*  ALKPHOS 38 35* 39* 27*  PROT 7.3 7.6 7.8 7.6  ALBUMIN 4.3 4.6 4.4 4.2    Assessment and Plan:  Bacteremia Need for IV antibx CKD- so not candidate for PICC scheduled for tunneled Central catheter placement in IR Pt and family aware of procedure benefits and risks including but not limirted to Infection; vessel damage Agreeable to proceed Consent signed andin chart  Electronically Signed: Nabeeha Badertscher A 01/24/2016, 12:21 PM   I spent a total of 15 Minutes at the the patient's bedside AND on the patient's hospital floor or unit, greater than 50% of which was counseling/coordinating care for tunneled central catheter

## 2016-01-25 ENCOUNTER — Inpatient Hospital Stay (HOSPITAL_COMMUNITY): Payer: Medicare Other

## 2016-01-25 LAB — BASIC METABOLIC PANEL
ANION GAP: 9 (ref 5–15)
BUN: 114 mg/dL — AB (ref 6–20)
CHLORIDE: 101 mmol/L (ref 101–111)
CO2: 20 mmol/L — ABNORMAL LOW (ref 22–32)
Calcium: 8.6 mg/dL — ABNORMAL LOW (ref 8.9–10.3)
Creatinine, Ser: 5.12 mg/dL — ABNORMAL HIGH (ref 0.61–1.24)
GFR calc Af Amer: 11 mL/min — ABNORMAL LOW (ref >60)
GFR, EST NON AFRICAN AMERICAN: 10 mL/min — AB (ref >60)
Glucose, Bld: 154 mg/dL — ABNORMAL HIGH (ref 65–99)
POTASSIUM: 4.3 mmol/L (ref 3.5–5.1)
SODIUM: 130 mmol/L — AB (ref 135–145)

## 2016-01-25 LAB — CBC
HEMATOCRIT: 21.3 % — AB (ref 39.0–52.0)
HEMOGLOBIN: 7.3 g/dL — AB (ref 13.0–17.0)
MCH: 31.7 pg (ref 26.0–34.0)
MCHC: 34.3 g/dL (ref 30.0–36.0)
MCV: 92.6 fL (ref 78.0–100.0)
Platelets: 238 10*3/uL (ref 150–400)
RBC: 2.3 MIL/uL — AB (ref 4.22–5.81)
RDW: 17.3 % — ABNORMAL HIGH (ref 11.5–15.5)
WBC: 8.6 10*3/uL (ref 4.0–10.5)

## 2016-01-25 LAB — URIC ACID: Uric Acid, Serum: 8.7 mg/dL — ABNORMAL HIGH (ref 4.4–7.6)

## 2016-01-25 LAB — GLUCOSE, CAPILLARY
Glucose-Capillary: 148 mg/dL — ABNORMAL HIGH (ref 65–99)
Glucose-Capillary: 160 mg/dL — ABNORMAL HIGH (ref 65–99)
Glucose-Capillary: 280 mg/dL — ABNORMAL HIGH (ref 65–99)
Glucose-Capillary: 244 mg/dL — ABNORMAL HIGH (ref 65–99)

## 2016-01-25 LAB — CK: Total CK: 60 U/L (ref 49–397)

## 2016-01-25 MED ORDER — CEFAZOLIN SODIUM-DEXTROSE 2-4 GM/100ML-% IV SOLN
2.0000 g | INTRAVENOUS | Status: DC
  Filled 2016-01-25: qty 100

## 2016-01-25 MED ORDER — MIDAZOLAM HCL 2 MG/2ML IJ SOLN
INTRAMUSCULAR | Status: AC
  Filled 2016-01-25: qty 2

## 2016-01-25 MED ORDER — LIDOCAINE HCL 1 % IJ SOLN
INTRAMUSCULAR | Status: AC | PRN
  Administered 2016-01-25: 10 mL

## 2016-01-25 MED ORDER — LIDOCAINE HCL 1 % IJ SOLN
INTRAMUSCULAR | Status: AC
  Filled 2016-01-25: qty 20

## 2016-01-25 MED ORDER — OXYCODONE-ACETAMINOPHEN 5-325 MG PO TABS
1.0000 | ORAL_TABLET | ORAL | Status: DC | PRN
Start: 2016-01-25 — End: 2016-02-03
  Administered 2016-01-25 – 2016-02-03 (×20): 1 via ORAL
  Filled 2016-01-25 (×20): qty 1

## 2016-01-25 MED ORDER — SODIUM CHLORIDE 0.9 % IV SOLN
INTRAVENOUS | Status: DC
  Administered 2016-01-25 – 2016-01-29 (×7): via INTRAVENOUS

## 2016-01-25 MED ORDER — FENTANYL CITRATE (PF) 100 MCG/2ML IJ SOLN
INTRAMUSCULAR | Status: AC
  Filled 2016-01-25: qty 2

## 2016-01-25 MED ORDER — CEFAZOLIN IN D5W 1 GM/50ML IV SOLN
1.0000 g | INTRAVENOUS | Status: DC
  Administered 2016-01-25 – 2016-01-31 (×6): 1 g via INTRAVENOUS
  Filled 2016-01-25 (×9): qty 50

## 2016-01-25 MED ORDER — IOPAMIDOL (ISOVUE-300) INJECTION 61%
INTRAVENOUS | Status: AC
  Administered 2016-01-25: 10 mL
  Filled 2016-01-25: qty 50

## 2016-01-25 MED ORDER — FENTANYL CITRATE (PF) 100 MCG/2ML IJ SOLN
INTRAMUSCULAR | Status: AC | PRN
  Administered 2016-01-25: 25 ug via INTRAVENOUS

## 2016-01-25 MED ORDER — MIDAZOLAM HCL 2 MG/2ML IJ SOLN
INTRAMUSCULAR | Status: AC | PRN
  Administered 2016-01-25: 0.5 mg via INTRAVENOUS

## 2016-01-25 NOTE — Procedures (Signed)
Successful placement of left IJ approach tunneled dual lumen PICC line with tip at the superior caval-atrial junction.   The PICC line is ready for immediate use.  Ronny Bacon, MD Pager #: [phone]

## 2016-01-25 NOTE — Progress Notes (Signed)
Progress Note    [name]  [id number] DOB: August 12, 1935  DOA: 01/16/2016 PCP: [name], MD    Brief Narrative:   [name] Konopa. is an 80 y.o. male with a PMH of paroxysmal atrial fibrillation/pacemaker not on anticoagulation secondary to history of GI bleed, CAD, stage IV CKD, CAD, hypertension,Liddle's syndrome, myelodysplastic syndrome, PVD and hyperlipidemia who was admitted 01/16/16 with multifocal pneumonia complicated by coagulopathy and hematuria. Blood cultures were positive for MSSA. Patient subsequently developed severe wrist pain and underwent arthrotomy of left wrist joint 01/21/16. Pseudogout suspected given negative synovial fluid cultures. Echocardiogram done which showed echodensity on RA lead.  Assessment/Plan:   Principal Problem:   Staphylococcus aureus bacteremia with pacemaker endocarditis, possible septic arthritis and right lower lobe pneumonia/Multifocal pneumonia Will need pacemaker extraction. Per ID recs: Current guidelines state that he should be safe to replace a new pacemaker 14 days after the first negative blood culture (that would be 01/31/2016). He will need a total of 4 weeks of antibiotic therapy through 02/12/2016.He is currently on rifampin and Ancef, which is being renally adjusted per nephrology recommendations. Interventional radiology placed a cuffed central catheter on 01/25/16 after surveillance cultures were negative. Dr. Lovena Le following. TEE planned for 01/26/16.  Active Problems:   Bladder mass Renal ultrasound done 01/17/16. 5 cm masslike area consistent with a blood clot. Bladder tumor could not be completely ruled out.We'll have RN bladder scan to rule out obstruction given his mass/clot since his creatinine is rising. Urologist consulted for consideration of cystoscopy with direct imaging of the mass given deterioration of renal function. Discussed case with Dr. Louis Meckel.    Coronary artery disease/chronic diastolic  CHF  Status post CABG. Aspirin/Plavix currently on hold secondary to hematuria and hemoptysis. Continue Lipitor, Imdur and Coreg. No evidence of heart failure exacerbation. Diuretics currently on hold. Monitor daily weights and I/of.    PAF (paroxysmal atrial fibrillation) (HCC) status post pacemaker for sick sinus syndrome CHA2DS2Vasc is at least 4, but not felt to be a candidate for anticoagulation given history of GI bleeding. Continue amiodarone.    CKD (chronic kidney disease), stage IV (HCC)/renal artery stenosis Baseline creatinine 3.8. Creatinine rising. Discussed case with Dr. Marval Regal who will consult. Agree with gentle IV fluids per nephrology recommendations.    Wrist swelling secondary to probable pseudogout, synovial fluid cultures pending Seen by orthopedics, status post incision and drainage 01/21/16. Operative synovial fluid Gram stain was negative for organisms. CPPD crystals noted. Findings most consistent with pseudogout. Continue prednisone. Follow-up cultures. Will follow-up with Dr. Grandville Silos.    Liddle's syndrome Monitor for hypokalemia and high blood pressure.    Myelodysplastic syndrome/anemia of chronic disease Gets Epogen injections every 2 weeks at the cancer center. Hemoglobin trending down. Continue to monitor. May need a blood transfusion if his hemoglobin dips below 7.    Diabetes mellitus with peripheral artery disease Diet controlled. CBGs 132-266. Start SSI, insulin sensitive scale with 3 units of meal coverage. Steroids may make glycemic control harder to achieve.    Gastroparesis    Hypertension Continue Coreg and clonidine. Lasix and spironolactone on hold.    Prostate cancer Gets Lupron injections every 4 months. S/P external beam radiation.   Family Communication/Anticipated D/C date and plan/Code Status   DVT prophylaxis: SCDs ordered. Code Status: Full Code.  Family Communication: Wife and multiple other family members updated at the  bedside. Disposition Plan: Home when medically stable, likely several more days given current sepsis/pacemaker endocarditis.  Medical Consultants:    Cardiology  Orthopedic Surgery  Infectious Disease  Nephrology  Urology   Procedures:   2 D Echo 01/24/16:  Dynamic obstruction with peak velocity of 351 cm/sec. no regional wall motion abnormalities. Grade 1 diastolic dysfunction. Mobile echodensity in the right atrium attached to pacer wire. Mild systolic pulmonary artery hypertension.  Dg Chest 2 View  01/16/2016  CLINICAL DATA:  Chest pain, cough for 2 days EXAM: CHEST  2 VIEW COMPARISON:  CT chest 04/03/2014 FINDINGS: Cardiomediastinal silhouette is unremarkable. Status post median sternotomy. Dual lead cardiac pacemaker with leads in right atrium and right ventricle. There is streaky airspace opacification in right base infrahilar region and right upper lobe peripheral perihilar. Findings highly suspicious for pneumonia. Probable small right pleural effusion. Left lung is clear. IMPRESSION: There is streaky airspace opacification in right base infrahilar region and right upper lobe peripheral perihilar. Findings highly suspicious for pneumonia. Followup PA and lateral chest X-ray is recommended in 3-4 weeks following trial of antibiotic therapy to ensure resolution and exclude underlying malignancy. Probable small right pleural effusion. Electronically Signed   By: Lahoma Crocker M.D.   On: 01/16/2016 13:58   Dg Wrist 2 Views Left  01/18/2016  CLINICAL DATA:  Acute onset of left wrist pain.  Initial encounter. EXAM: LEFT WRIST - 2 VIEW COMPARISON:  None. FINDINGS: There is no evidence of fracture or dislocation. There is joint space narrowing along the radiocarpal joint, and at the radial aspect of the carpal rows. This resolves in mild chronic deformity of the scaphoid and lunate, with expansion of the scapholunate distance to 5 mm. Scattered vascular calcifications are seen. No additional  soft tissue abnormalities are characterized on radiograph. IMPRESSION: 1. No evidence of fracture or dislocation. 2. Degenerative joint space narrowing along the radiocarpal joints, and at the radial aspect of the carpal rows. This results in mild chronic deformity of the scaphoid and lunate, with expansion of the scapholunate distance to 5 mm, reflecting scapholunate dissociation. Electronically Signed   By: Garald Balding M.D.   On: 01/18/2016 16:57   Ct Chest Wo Contrast  01/16/2016  CLINICAL DATA:  Hemoptysis, cough.  Symptoms since Monday. EXAM: CT CHEST WITHOUT CONTRAST TECHNIQUE: TECHNIQUE Multidetector CT imaging of the chest was performed following the standard protocol without IV contrast. COMPARISON:  Chest radiograph 01/16/2016, CT 04/03/2014 FINDINGS: Cardiovascular: Calcification of the thoracic aorta. Coronary bypass graft. Pacer wires in the RIGHT heart. No pericardial fluid. Mediastinum/Nodes: No axillary or supraclavicular adenopathy. No mediastinal hilar adenopathy. Esophagus normal. Lungs/Pleura: There is consolidative airspace disease in the RIGHT lower lobe with air bronchograms. A similar pattern in the posterior aspect the RIGHT upper lobe. Smaller pattern of consolidation in the LEFT lower lobe. Findings are consistent with multifocal pneumonia. Upper Abdomen: Limited view of the liver, kidneys, pancreas are unremarkable. Normal adrenal glands. Musculoskeletal: No aggressive osseous lesion.  Midline sternotomy. IMPRESSION: 1. Multifocal pneumonia with most dense infection in RIGHT lower lobe. 2. Coronary artery calcification and aortic atherosclerotic calcification. Electronically Signed   By: Suzy Bouchard M.D.   On: 01/16/2016 16:32   US Renal  01/17/2016  CLINICAL DATA:  80 year old male with microscopic hematuria. Personal history of prostate cancer. Initial encounter. EXAM: RENAL / URINARY TRACT ULTRASOUND COMPLETE COMPARISON:  CT Abdomen and Pelvis 08/30/2015, and earlier. Right  upper quadrant ultrasound from today reported separately. FINDINGS: Right Kidney: Length: 12.3 cm. Echogenicity within normal limits. No mass or hydronephrosis visualized. Left Kidney: Length: 12.0 cm. Echogenicity within normal  limits. No mass or hydronephrosis visualized. Bladder: 5 cm area which is dependent, complex, and mass like (image 15), however, there is no internal vascularity detected with brief color Doppler interrogation. Elsewhere the urinary bladder appears normal. Prostate measured to be 5.2 x 3.1 x 4.3 cm. IMPRESSION: 1. Abnormal 5 cm complex, mass-like area of dependent echogenicity in the urinary bladder. However, lack of internal vascularity on Doppler suggests this is more likely blood clot rather than a bladder tumor. Further evaluation recommended. 2. Normal for age sonographic appearance of both kidneys. Electronically Signed   By: Genevie Ann M.D.   On: 01/17/2016 08:01   US Renal  01/09/2016  CLINICAL DATA:  Two weeks of hematuria ; history of prostate enlargement EXAM: RENAL / URINARY TRACT ULTRASOUND COMPLETE COMPARISON:  Abdominal pelvic CT scan of July 30, 2015 FINDINGS: Right Kidney: Length: 11.5 cm. The renal cortical echotexture is equal to or slightly greater than that of the adjacent liver. There is no hydronephrosis nor discrete mass. Left Kidney: Length: 10.3 cm. The renal cortical echotexture is mildly increased similar to that on the right. There is no hydronephrosis. Bladder: There is an irregular mass in the posterior aspect of the urinary bladder wall measuring 2.5 x 2 x 4.4 cm. This appears separate from the more anteriorly and inferiorly positioned prostate gland. The prostate gland itself does produce a mild impression upon the urinary bladder base. IMPRESSION: 1. Posterior bladder wall mass worrisome for malignancy. This is separate from the mildly enlarged prostate gland. 2. Increased renal cortical echotexture bilaterally consistent with medical renal disease. There  is no hydronephrosis. Electronically Signed   By: David  Martinique M.D.   On: 01/09/2016 14:44   Dg Fluoro Guided Needle Plc Aspiration/injection Loc  01/21/2016  CLINICAL DATA:  Left wrist pain and swelling.  Sepsis. EXAM: LEFT WRIST ASPIRATION UNDER FLUOROSCOPY FLUOROSCOPY TIME:  Fluoroscopy Time (in minutes and seconds): 24 SECONDS PROCEDURE: Overlying skin prepped with Betadine, draped in the usual sterile fashion, and infiltrated locally with buffered Lidocaine. 20 gauge needle advanced into the radiocarpal joint under direct fluoroscopic visualization. Approximately 3 cc of bloody fluid was aspirated from the joint and overlying soft tissues. IMPRESSION: Left radiocarpal joint aspiration performed without immediate complication. Mr. Smialek with tolerated the procedure well. Technically successful  hip injection under fluoroscopy. Electronically Signed   By: Lorriane Shire M.D.   On: 01/21/2016 08:10   US Abdomen Limited Ruq  01/17/2016  CLINICAL DATA:  80 year old male with abnormal LFTs. Prior cholecystectomy. Pneumonia. Initial encounter. EXAM: US ABDOMEN LIMITED - RIGHT UPPER QUADRANT COMPARISON:  Noncontrast chest CT 01/16/2016. CT Abdomen and Pelvis 08/30/2015 and earlier FINDINGS: Gallbladder: Surgically absent Common bile duct: Diameter: 6 mm, normal Liver: Chronic 2.6 cm left hepatic lobe cyst (image 18) does demonstrate mild wall thickening, however, this lesion is stable on multiple prior CTs back to 2011. Similar 3.1 cm right lobe cyst, also not significantly changed since 2011. Underlying hepatic echotexture is within normal limits. No intrahepatic biliary ductal dilatation is evident. No new liver lesion identified. Other findings: Negative visible right kidney. No free fluid identified. IMPRESSION: No acute liver or biliary findings. 2-3 cm liver cysts which are mildly thick walled but benign, having not significantly changed since 2011. Electronically Signed   By: Genevie Ann M.D.   On:  01/17/2016 07:56    Anti-Infectives:   Azithromycin 01/16/16---> 01/17/16 Rocephin 01/16/16---> 01/17/16 Ancef 01/17/16---> Rifampin 01/17/16 --->  Subjective:   [name] Marble Sr. is a bit  sedated at the moment. He had a tunneled catheter placed by IR prior to my visiting with him. His family is with him at the bedside. He had a discussion with the nephrologist today and would not want hemodialysis if his kidney function deteriorates further. He appears weak.  Objective:    Filed Vitals:   01/24/16 1216 01/24/16 1741 01/24/16 2055 01/25/16 0534  BP: 93/50 116/55 116/52 134/58  Pulse: 70  73 74  Temp: 98.3 F (36.8 C)  99 F (37.2 C) 98.2 F (36.8 C)  TempSrc: Oral  Oral Oral  Resp: 20  20 20   Height:      Weight:    76.25 kg (168 lb 1.6 oz)  SpO2: 98%  98% 100%    Intake/Output Summary (Last 24 hours) at 01/25/16 0805 Last data filed at 01/25/16 0254  Gross per 24 hour  Intake    960 ml  Output    402 ml  Net    558 ml   Filed Weights   01/23/16 0247 01/24/16 0416 01/25/16 0534  Weight: 75.841 kg (167 lb 3.2 oz) 77.52 kg (170 lb 14.4 oz) 76.25 kg (168 lb 1.6 oz)    Exam: General exam: Appears Mildly lethargic. Respiratory system: Clear to auscultation. Respiratory effort normal. Cardiovascular system: S1 & S2 heard, RRR. No JVD,  rubs, gallops or clicks. Grade 2/6 SEM. Gastrointestinal system: Abdomen is nondistended, soft and nontender. No organomegaly or masses felt. Normal bowel sounds heard. Central nervous system: Mildly lethargic. No focal neurological deficits. Extremities: Left hand markedly erythematous/swollen and tender with limited range of motion of fingers. Skin: No rashes, lesions or ulcers Psychiatry: Judgement and insight appear normal. Mood & affect appropriate.   Data Reviewed:   I have personally reviewed following labs and imaging studies:  Labs: Basic Metabolic Panel:  Recent Labs Lab 01/21/16 0314 01/22/16 0523 01/23/16 0239  01/24/16 0317 01/25/16 0311  NA 132* 131* 128* 131* 130*  K 4.3 3.9 3.7 4.2 4.3  CL 100* 101 98* 102 101  CO2 24 20* 20* 20* 20*  GLUCOSE 128* 94 127* 192* 154*  BUN 71* 82* 83* 100* 114*  CREATININE 3.06* 3.51* 3.69* 4.20* 5.12*  CALCIUM 9.0 8.8* 8.5* 8.7* 8.6*   GFR Estimated Creatinine Clearance: 12.3 mL/min (by C-G formula based on Cr of 5.12).  CBC:  Recent Labs Lab 01/18/16 0923 01/19/16 0359 01/23/16 0239 01/24/16 0317 01/25/16 0311  WBC 15.3* 13.3* 9.6 10.0 8.6  HGB 9.2* 8.8* 7.9* 8.1* 7.3*  HCT 28.7* 26.5* 22.9* 23.1* 21.3*  MCV 101.1* 97.8 92.3 91.7 92.6  PLT 108* 109* 169 212 238   CBG:  Recent Labs Lab 01/23/16 1111 01/23/16 1637 01/23/16 2243 01/24/16 2238 01/25/16 0647  GLUCAP 155* 228* 266* 240* 148*   Microbiology Recent Results (from the past 240 hour(s))  Blood culture (routine x 2)     Status: Abnormal   Collection Time: 01/16/16  1:00 PM  Result Value Ref Range Status   Specimen Description LEFT ANTECUBITAL  Final   Special Requests BOTTLES DRAWN AEROBIC AND ANAEROBIC 10CC  Final   Culture  Setup Time   Final    GRAM POSITIVE COCCI IN CLUSTERS IN BOTH AEROBIC AND ANAEROBIC BOTTLES CRITICAL RESULT CALLED TO, READ BACK BY AND VERIFIED WITH: J MARKLE 01/17/16 @ 0927 M VESTAL    Culture (A)  Final    STAPHYLOCOCCUS AUREUS SUSCEPTIBILITIES PERFORMED ON PREVIOUS CULTURE WITHIN THE LAST 5 DAYS.    Report Status 01/19/2016 FINAL  Final  Blood culture (routine x 2)     Status: Abnormal   Collection Time: 01/16/16  1:06 PM  Result Value Ref Range Status   Specimen Description BLOOD LEFT HAND  Final   Special Requests AEROBIC BOTTLE ONLY 10CC  Final   Culture  Setup Time   Final    GRAM POSITIVE COCCI IN CLUSTERS AEROBIC BOTTLE ONLY CRITICAL RESULT CALLED TO, READ BACK BY AND VERIFIED WITH: J MARKLE 01/17/16 @ 0927 M VESTAL    Culture STAPHYLOCOCCUS AUREUS (A)  Final   Report Status 01/19/2016 FINAL  Final   Organism ID, Bacteria  STAPHYLOCOCCUS AUREUS  Final      Susceptibility   Staphylococcus aureus - MIC*    CIPROFLOXACIN >=8 RESISTANT Resistant     ERYTHROMYCIN <=0.25 SENSITIVE Sensitive     GENTAMICIN <=0.5 SENSITIVE Sensitive     OXACILLIN 0.5 SENSITIVE Sensitive     TETRACYCLINE <=1 SENSITIVE Sensitive     VANCOMYCIN <=0.5 SENSITIVE Sensitive     TRIMETH/SULFA <=10 SENSITIVE Sensitive     CLINDAMYCIN <=0.25 SENSITIVE Sensitive     RIFAMPIN <=0.5 SENSITIVE Sensitive     Inducible Clindamycin NEGATIVE Sensitive     * STAPHYLOCOCCUS AUREUS  Blood Culture ID Panel (Reflexed)     Status: Abnormal   Collection Time: 01/16/16  1:06 PM  Result Value Ref Range Status   Enterococcus species NOT DETECTED NOT DETECTED Final   Vancomycin resistance NOT DETECTED NOT DETECTED Final   Listeria monocytogenes NOT DETECTED NOT DETECTED Final   Staphylococcus species DETECTED (A) NOT DETECTED Final    Comment: CRITICAL RESULT CALLED TO, READ BACK BY AND VERIFIED WITH: J MARKLE 01/17/16 @ 0927 M VESTAL    Staphylococcus aureus DETECTED (A) NOT DETECTED Final    Comment: CRITICAL RESULT CALLED TO, READ BACK BY AND VERIFIED WITH: J MARKLE 01/17/16 @ 0927 M VESTAL    Methicillin resistance NOT DETECTED NOT DETECTED Final   Streptococcus species NOT DETECTED NOT DETECTED Final   Streptococcus agalactiae NOT DETECTED NOT DETECTED Final   Streptococcus pneumoniae NOT DETECTED NOT DETECTED Final   Streptococcus pyogenes NOT DETECTED NOT DETECTED Final   Acinetobacter baumannii NOT DETECTED NOT DETECTED Final   Enterobacteriaceae species NOT DETECTED NOT DETECTED Final   Enterobacter cloacae complex NOT DETECTED NOT DETECTED Final   Escherichia coli NOT DETECTED NOT DETECTED Final   Klebsiella oxytoca NOT DETECTED NOT DETECTED Final   Klebsiella pneumoniae NOT DETECTED NOT DETECTED Final   Proteus species NOT DETECTED NOT DETECTED Final   Serratia marcescens NOT DETECTED NOT DETECTED Final   Carbapenem resistance NOT DETECTED  NOT DETECTED Final   Haemophilus influenzae NOT DETECTED NOT DETECTED Final   Neisseria meningitidis NOT DETECTED NOT DETECTED Final   Pseudomonas aeruginosa NOT DETECTED NOT DETECTED Final   Candida albicans NOT DETECTED NOT DETECTED Final   Candida glabrata NOT DETECTED NOT DETECTED Final   Candida krusei NOT DETECTED NOT DETECTED Final   Candida parapsilosis NOT DETECTED NOT DETECTED Final   Candida tropicalis NOT DETECTED NOT DETECTED Final  Culture, sputum-assessment     Status: None   Collection Time: 01/16/16 10:24 PM  Result Value Ref Range Status   Specimen Description EXPECTORATED SPUTUM  Final   Special Requests NONE  Final   Sputum evaluation   Final    THIS SPECIMEN IS ACCEPTABLE. RESPIRATORY CULTURE REPORT TO FOLLOW.   Report Status 01/17/2016 FINAL  Final  Culture, respiratory (NON-Expectorated)     Status: None   Collection  Time: 01/16/16 10:24 PM  Result Value Ref Range Status   Specimen Description EXPECTORATED SPUTUM  Final   Special Requests NONE  Final   Gram Stain   Final    RARE SQUAMOUS EPITHELIAL CELLS PRESENT ABUNDANT WBC PRESENT,BOTH PMN AND MONONUCLEAR FEW GRAM POSITIVE COCCI IN PAIRS IN CLUSTERS    Culture Consistent with normal respiratory flora.  Final   Report Status 01/19/2016 FINAL  Final  Culture, blood (routine x 2)     Status: None   Collection Time: 01/17/16  9:50 AM  Result Value Ref Range Status   Specimen Description BLOOD BLOOD LEFT ARM  Final   Special Requests BOTTLES DRAWN AEROBIC ONLY 5CC  Final   Culture NO GROWTH 5 DAYS  Final   Report Status 01/22/2016 FINAL  Final  Culture, blood (routine x 2)     Status: None   Collection Time: 01/17/16  9:50 AM  Result Value Ref Range Status   Specimen Description BLOOD LEFT ANTECUBITAL  Final   Special Requests BOTTLES DRAWN AEROBIC ONLY 5CC  Final   Culture NO GROWTH 5 DAYS  Final   Report Status 01/22/2016 FINAL  Final  Acid Fast Smear (AFB)     Status: None   Collection Time: 01/17/16  11:26 AM  Result Value Ref Range Status   AFB Specimen Processing Concentration  Final   Acid Fast Smear Negative  Final    Comment: (NOTE) Performed At: [facility] [place], [place] [id number] Lindon Romp MD [id number]    Source (AFB) SPUTUM  Final  Body fluid culture     Status: None   Collection Time: 01/19/16  4:37 PM  Result Value Ref Range Status   Specimen Description FLUID SYNOVIAL WRIST LEFT  Final   Special Requests NONE  Final   Gram Stain   Final    ABUNDANT WBC PRESENT,BOTH PMN AND MONONUCLEAR NO ORGANISMS SEEN    Culture NO GROWTH 3 DAYS  Final   Report Status 01/22/2016 FINAL  Final  Aerobic/Anaerobic Culture (surgical/deep wound)     Status: None (Preliminary result)   Collection Time: 01/21/16  6:52 PM  Result Value Ref Range Status   Specimen Description WOUND LEFT WRIST  Final   Special Requests NONE  Final   Gram Stain   Final    WBC PRESENT,BOTH PMN AND MONONUCLEAR NO ORGANISMS SEEN    Culture   Final    NO GROWTH 2 DAYS NO ANAEROBES ISOLATED; CULTURE IN PROGRESS FOR 5 DAYS   Report Status PENDING  Incomplete    Radiology: No results found.  Medications:   . amiodarone  100 mg Oral Daily  . atorvastatin  80 mg Oral QHS  . Calcifediol ER  30 mcg Oral QHS  . carvedilol  25 mg Oral BID WC  .  ceFAZolin (ANCEF) IV  2 g Intravenous Q12H  . cloNIDine  0.1 mg Oral BID  . hydrALAZINE  50 mg Oral TID  . insulin aspart  0-5 Units Subcutaneous QHS  . insulin aspart  0-9 Units Subcutaneous TID WC  . insulin aspart  3 Units Subcutaneous TID WC  . isosorbide mononitrate  120 mg Oral Daily  . multivitamin with minerals  1 tablet Oral q morning - 10a  . predniSONE  50 mg Oral Q breakfast  . rifampin  600 mg Oral Daily  . tamsulosin  0.4 mg Oral QPC supper   Continuous Infusions:   Time spent: 35 minutes.  The patient  is medically complex with multiple co-morbidities and is at high risk for clinical deterioration and  requires high complexity decision making and coordination of care with multiple specialists.    LOS: 9 days   [facility] Pager [phone]. If unable to reach me by pager, please call my cell phone at [phone].  *Please refer to [email or web address], password [email or web address] to get updated schedule on who will round on this patient, as hospitalists switch teams weekly. If 7PM-7AM, please contact night-coverage at [email or web address], password [email or web address] for any overnight needs.  01/25/2016, 8:05 AM

## 2016-01-25 NOTE — Sedation Documentation (Signed)
Patient denies pain and is resting comfortably.  

## 2016-01-25 NOTE — Consult Note (Signed)
Reason for Consult: AKI/CKD Referring Physician:  Rama, MD  Mina [name] Sr. is an 80 y.o. male.  HPI: [name] is a 80 yo AAM with an extensive PMH most significant for CAD s/p CABG, prostate cancer, HTN, diastolic CHF, myelodysplastic syndrome with thrombocytopenia, atrial fib (not on anticoagulation due to GI bleed), PVD, as well as CKD stage 4 (baseline Scr 2.8-3.5) who was admitted on 01/16/16 with multifocal pneumonia complicated by MSSA bacteremia and seeding of pacemaker wire.  He was started on Ancef and rifampin and we have been asked to see him due to worsening renal function.  His trend in Scr is seen below.  He is followed by Dr. Posey Pronto at our office and he was a marginal candidate at best prior to this hospitalization due to his poor functional status which has deteriorated more during this most recent illness.  He was found to have a bladder mass on [place] and was referred to Urology in early July due to concern of malignancy, however this was done prior to his admission and he has not been evaluated for this as of yet.    Trend in Creatinine:   CREATININE, SER  Date/Time Value Ref Range Status  01/25/2016 03:11 AM 5.12* 0.61 - 1.24 mg/dL Final  01/24/2016 03:17 AM 4.20* 0.61 - 1.24 mg/dL Final  01/23/2016 02:39 AM 3.69* 0.61 - 1.24 mg/dL Final  01/22/2016 05:23 AM 3.51* 0.61 - 1.24 mg/dL Final  01/21/2016 03:14 AM 3.06* 0.61 - 1.24 mg/dL Final  01/20/2016 03:09 AM 3.02* 0.61 - 1.24 mg/dL Final  01/19/2016 03:59 AM 3.60* 0.61 - 1.24 mg/dL Final  01/18/2016 09:23 AM 3.84* 0.61 - 1.24 mg/dL Final  01/16/2016 12:39 PM 3.48* 0.61 - 1.24 mg/dL Final  11/07/2015 02:40 PM 5.79* 0.70 - 1.18 mg/dL Final  10/29/2015 03:44 AM 3.34* 0.61 - 1.24 mg/dL Final  10/20/2015 03:00 PM 3.72* 0.61 - 1.24 mg/dL Final  09/26/2015 12:10 PM 3.27* 0.61 - 1.24 mg/dL Final  08/30/2015 03:29 AM 2.39* 0.61 - 1.24 mg/dL Final  08/29/2015 07:55 PM 2.71* 0.61 - 1.24 mg/dL Final  04/03/2014 06:30 PM 2.52* 0.50  - 1.35 mg/dL Final  06/28/2013 12:25 PM 2.9* 0.4 - 1.5 mg/dL Final  06/20/2013 12:42 PM 2.8* 0.4 - 1.5 mg/dL Final  06/14/2013 05:35 AM 2.98* 0.50 - 1.35 mg/dL Final  06/13/2013 11:25 AM 2.90* 0.50 - 1.35 mg/dL Final  06/12/2013 05:42 AM 3.15* 0.50 - 1.35 mg/dL Final  06/11/2013 06:43 AM 3.16* 0.50 - 1.35 mg/dL Final  06/10/2013 06:29 AM 3.12* 0.50 - 1.35 mg/dL Final  06/09/2013 03:32 PM 3.03* 0.50 - 1.35 mg/dL Final  06/09/2013 04:20 AM 2.81* 0.50 - 1.35 mg/dL Final  06/08/2013 03:58 AM 2.42* 0.50 - 1.35 mg/dL Final  06/07/2013 04:50 AM 2.11* 0.50 - 1.35 mg/dL Final  06/06/2013 02:25 AM 2.24* 0.50 - 1.35 mg/dL Final  06/05/2013 05:00 PM 2.28* 0.50 - 1.35 mg/dL Final  06/05/2013 07:30 AM 2.03* 0.50 - 1.35 mg/dL Final  06/04/2013 06:49 PM 2.01* 0.50 - 1.35 mg/dL Final  04/17/2013 12:35 PM 2.4* 0.4 - 1.5 mg/dL Final  03/20/2013 04:30 AM 2.61* 0.50 - 1.35 mg/dL Final  03/19/2013 04:05 AM 2.63* 0.50 - 1.35 mg/dL Final  03/18/2013 06:00 AM 2.45* 0.50 - 1.35 mg/dL Final  03/17/2013 05:34 AM 2.20* 0.50 - 1.35 mg/dL Final  03/16/2013 06:48 PM 2.39* 0.50 - 1.35 mg/dL Final  02/13/2013 10:56 AM 2.30* 0.50 - 1.35 mg/dL Final  05/19/2012 04:35 AM 1.34 0.50 - 1.35 mg/dL Final  05/17/2012 05:08 AM 1.67* 0.50 - 1.35 mg/dL Final  05/16/2012 09:17 AM 1.50* 0.50 - 1.35 mg/dL Final  01/06/2012 04:10 AM 1.38* 0.50 - 1.35 mg/dL Final  01/05/2012 05:10 AM 1.40* 0.50 - 1.35 mg/dL Final  01/04/2012 05:00 PM 1.27 0.50 - 1.35 mg/dL Final  01/04/2012 04:54 PM 1.30 0.50 - 1.35 mg/dL Final  01/04/2012 04:00 AM 1.06 0.50 - 1.35 mg/dL Final  01/03/2012 08:31 PM 1.00 0.50 - 1.35 mg/dL Final  01/03/2012 08:25 PM 0.93 0.50 - 1.35 mg/dL Final  01/02/2012 05:48 AM 1.10 0.50 - 1.35 mg/dL Final  12/31/2011 06:31 AM 1.18 0.50 - 1.35 mg/dL Final  12/30/2011 05:30 AM 1.39* 0.50 - 1.35 mg/dL Final  12/25/2011 01:20 PM 1.41* 0.50 - 1.35 mg/dL Final  12/25/2011 04:16 AM 1.35 0.50 - 1.35 mg/dL Final    PMH:   Past  Medical History  Diagnosis Date  . Hypertension   . Hyperlipidemia   . GERD (gastroesophageal reflux disease)   . DVT (deep venous thrombosis) ([place]) 2011    Right arm  . Atrial fibrillation ([place])   . Gout   . Depressive disorder   . Internal hemorrhoids without mention of complication   . Gastroparesis   . Osteopenia   . Ischemic colitis ([place])   . Peripheral artery disease ([place])   . Chronic diastolic heart failure ([place])   . 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 Renal artery stenosis ([place])   . CKD (chronic kidney disease)   . Vitamin B 12 deficiency   . CAD (coronary artery disease)     s/b CABG 1994, and subsequent stents. Repeat CABG 12/2011,  . Prostate cancer ([place]) 1997    XRT and lupron  . Myelodysplastic syndrome ([place]) 05/22/2013    With low hemoglobin and platelets treated with Procrit  . Angiomyolipoma 2009    On both kidneys noted in 2009  . Sick sinus syndrome ([place])   . Peptic ulcer     S/p partial gastrectomy in 1969  . Arthritis     neck and left wrist  . History of hiatal hernia   . Diabetes mellitus type 2 with peripheral artery disease ([facility])     DIET CONTROLLED  . Pneumonia 01/16/2016  . Presence of permanent cardiac pacemaker     PSH:   Past Surgical History  Procedure Laterality Date  . Hiatal hernia repair      and ulcer repair  . Appendectomy  1991  . Pacemaker insertion  10/18/1994    DDD pacemaker, [employer]. Gen change 12/10/2003.  Marland Kitchen Coronary artery bypass graft  01/22/1993  . Cholecystectomy  Oct 2009    Laparoscopic  . Coronary artery bypass graft  01/03/2012    Procedure: REDO CORONARY ARTERY BYPASS GRAFTING (CABG);  Surgeon: Gaye Pollack, MD;  Location: [facility];  Service: Open Heart Surgery;  Laterality: N/A;  Redo CABG x  using bilateral internal mammary arteries;  left leg greater saphenous vein harvested endoscopically  . Celiac artery angioplasty  05-16-12    and stenting  . Partial gastrectomy  1969    Hx of ulcer s/p partial gastrectomy/ has  pernicious anemia  . Other surgical history  02/13/13    superior mesenteric artery angiogram  . Thrombectomy / embolectomy subclavian artery  02/02/10    Right subclavian thromboectomy and venous angioplasty, and chronic mesenteric ischemia with Herculink stenting to superior mesenteric and celiac arteries - Dr. Trula Slade  . Other surgical history  05/16/12    Stent in stomach  .  Pacemaker generator change  12/10/2003    SJM Identity XL DR performed by Dr Leonia Reeves  . Visceral angiogram N/A 05/25/2011    Procedure: VISCERAL ANGIOGRAM;  Surgeon: Serafina Mitchell, MD;  Location: [facility] CATH LAB;  Service: Cardiovascular;  Laterality: N/A;  . Abdominal angiogram N/A 05/25/2011    Procedure: ABDOMINAL ANGIOGRAM;  Surgeon: Serafina Mitchell, MD;  Location: [facility] CATH LAB;  Service: Cardiovascular;  Laterality: N/A;  . Lower extremity angiogram Bilateral 05/25/2011    Procedure: LOWER EXTREMITY ANGIOGRAM;  Surgeon: Serafina Mitchell, MD;  Location: [facility] CATH LAB;  Service: Cardiovascular;  Laterality: Bilateral;  . Left heart catheterization with coronary/graft angiogram  12/24/2011    Procedure: LEFT HEART CATHETERIZATION WITH Beatrix Fetters;  Surgeon: Sinclair Grooms, MD;  Location: [facility] CATH LAB;  Service: Cardiovascular;;  . Visceral angiogram Bilateral 12/28/2011    Procedure: VISCERAL ANGIOGRAM;  Surgeon: Serafina Mitchell, MD;  Location: [facility] CATH LAB;  Service: Cardiovascular;  Laterality: Bilateral;  . Visceral angiogram N/A 05/16/2012    Procedure: VISCERAL ANGIOGRAM;  Surgeon: Serafina Mitchell, MD;  Location: [facility] CATH LAB;  Service: Cardiovascular;  Laterality: N/A;  . Visceral angiogram N/A 02/13/2013    Procedure: VISCERAL ANGIOGRAM;  Surgeon: Serafina Mitchell, MD;  Location: [facility] CATH LAB;  Service: Cardiovascular;  Laterality: N/A;  . Renal angiogram N/A 02/13/2013    Procedure: RENAL ANGIOGRAM;  Surgeon: Serafina Mitchell, MD;  Location: [facility] CATH LAB;  Service: Cardiovascular;  Laterality: N/A;  . Abdominal  angiogram N/A 02/13/2013    Procedure: ABDOMINAL ANGIOGRAM;  Surgeon: Serafina Mitchell, MD;  Location: [facility] CATH LAB;  Service: Cardiovascular;  Laterality: N/A;  . Inguinal hernia repair Right 10/28/2015    Procedure: OPEN RIGHT INGUINAL HERNIA REPAIR;  Surgeon: Greer Pickerel, MD;  Location: [facility];  Service: General;  Laterality: Right;  . Insertion of mesh Right 10/28/2015    Procedure: INSERTION OF MESH;  Surgeon: Greer Pickerel, MD;  Location: [facility];  Service: General;  Laterality: Right;  . I&d extremity Left 01/21/2016    Procedure: IRRIGATION AND DEBRIDEMENT EXTREMITY;  Surgeon: Milly Jakob, MD;  Location: [facility];  Service: Orthopedics;  Laterality: Left;    Allergies:  Allergies  Allergen Reactions  . Nsaids Other (See Comments)    GI issue  . Ibuprofen Other (See Comments)    GI Issues  . Ace Inhibitors Cough    Medications:   Prior to Admission medications   Medication Sig Start Date End Date Taking? Authorizing Provider  acetaminophen (ARTHRITIS PAIN RELIEF) 650 MG CR tablet Take 1,300 mg by mouth 3 (three) times daily.    Yes Historical Provider, MD  amiodarone (PACERONE) 200 MG tablet Take one-half tablet by  mouth daily 01/07/16  Yes Belva Crome, MD  aspirin 81 MG tablet Take 1 tablet (81 mg total) by mouth daily. 09/10/14  Yes Belva Crome, MD  atorvastatin (LIPITOR) 80 MG tablet Take 80 mg by mouth at bedtime.    Yes Historical Provider, MD  Calcifediol ER (RAYALDEE) 30 MCG CPCR Take 30 mcg by mouth at bedtime.   Yes Historical Provider, MD  CALCIUM-VITAMIN D PO Take 1 capsule by mouth 2 (two) times daily.   Yes Historical Provider, MD  carvedilol (COREG) 25 MG tablet TAKE 1 TABLET BY MOUTH  TWICE DAILY WITH MEALS 01/07/16  Yes Belva Crome, MD  cloNIDine (CATAPRES) 0.1 MG tablet Take 0.1 mg by mouth 2 (two) times daily. 02/19/15  Yes Historical Provider,  MD  clopidogrel (PLAVIX) 75 MG tablet Take 1 tablet by mouth  daily 01/07/16  Yes Lyn Records, MD  cyanocobalamin (,VITAMIN  B-12,) 1000 MCG/ML injection Inject 1,000 mcg into the muscle every 30 (thirty) days. Vitamin B12 - last injection 09/01/15   Yes Historical Provider, MD  epoetin alfa (EPOGEN,PROCRIT) 07184 UNIT/ML injection Inject 10,000 Units into the skin every 14 (fourteen) days. Done at [facility] center - last injection 09/22/15   Yes Historical Provider, MD  furosemide (LASIX) 80 MG tablet Take 80 mg by mouth 2 (two) times daily. Pt takes 1 and 1/2 tablet daily. (140 mg) total   Yes Historical Provider, MD  hydrALAZINE (APRESOLINE) 50 MG tablet Take 1 tablet (50 mg total) by mouth 3 (three) times daily. 11/23/13  Yes Lyn Records, MD  isosorbide mononitrate (IMDUR) 120 MG 24 hr tablet Take 1 tablet by mouth  daily 01/07/16  Yes Lyn Records, MD  Multiple Vitamin (MULTIVITAMIN WITH MINERALS) TABS tablet Take 1 tablet by mouth every morning.   Yes Historical Provider, MD  pantoprazole (PROTONIX) 40 MG tablet Take 1 tablet by mouth daily. 01/14/16  Yes Historical Provider, MD  potassium chloride SA (K-DUR,KLOR-CON) 20 MEQ tablet Take 20-40 mEq by mouth 3 (three) times daily. Take 2 tablets (40 meq) by mouth with breakfast, take 1 tablet (20 meq) with lunch and supper 06/21/13  Yes Lyn Records, MD  PRESCRIPTION MEDICATION Supportive Therapy [facility]   Yes Historical Provider, MD  silodosin (RAPAFLO) 8 MG CAPS capsule Take 8 mg by mouth at bedtime.    Yes Historical Provider, MD  spironolactone (ALDACTONE) 25 MG tablet Take 25 mg by mouth daily.   Yes Historical Provider, MD  leuprolide (LUPRON) 30 MG injection Inject 30 mg into the muscle every 4 (four) months.    Historical Provider, MD    Inpatient medications: . amiodarone  100 mg Oral Daily  . atorvastatin  80 mg Oral QHS  . Calcifediol ER  30 mcg Oral QHS  . carvedilol  25 mg Oral BID WC  .  ceFAZolin (ANCEF) IV  2 g Intravenous Q12H  . cloNIDine  0.1 mg Oral BID  . hydrALAZINE  50 mg Oral TID  . insulin aspart  0-5 Units Subcutaneous QHS  . insulin aspart   0-9 Units Subcutaneous TID WC  . insulin aspart  3 Units Subcutaneous TID WC  . isosorbide mononitrate  120 mg Oral Daily  . multivitamin with minerals  1 tablet Oral q morning - 10a  . predniSONE  50 mg Oral Q breakfast  . rifampin  600 mg Oral Daily  . tamsulosin  0.4 mg Oral QPC supper    Discontinued Meds:   Medications Discontinued During This Encounter  Medication Reason  . azithromycin (ZITHROMAX) 500 mg in dextrose 5 % 250 mL IVPB   . cefTRIAXone (ROCEPHIN) 1 g in dextrose 5 % 50 mL IVPB   . spironolactone (ALDACTONE) tablet 25 mg   . potassium chloride SA (K-DUR,KLOR-CON) CR tablet 40 mEq   . iopamidol (ISOVUE-M) 41 % intrathecal injection Returned to ADS  . 0.9 %  sodium chloride infusion   . ceFAZolin (ANCEF) IVPB 2g/100 mL premix   . 0.9 % irrigation (POUR BTL) Patient Discharge  . meperidine (DEMEROL) injection 6.25-12.5 mg Patient Transfer  . promethazine (PHENERGAN) injection 6.25-12.5 mg Patient Transfer  . HYDROmorphone (DILAUDID) injection 0.25-0.5 mg Patient Transfer  . colchicine tablet 0.6 mg   . Calcifediol ER CPCR 30 mcg  Social History:  reports that he quit smoking about 46 years ago. His smoking use included Cigarettes. He quit after 20 years of use. He has never used smokeless tobacco. He reports that he does not drink alcohol or use illicit drugs.  Family History:   Family History  Problem Relation Age of Onset  . Diabetes Mother   . Heart disease Mother     Heart Disease before age 64  . Hyperlipidemia Mother   . Hypertension Mother   . CVA Mother 68    cause of death  . Cancer Father     stomach/liver  . Hypertension Father     possibly hypertensive  . Cancer Sister     Breast cancer  . Heart disease Daughter     Heart Disease before age 85  . Hypertension Daughter   . Heart attack Daughter   . CAD Brother   . Heart disease Brother   . Cancer Paternal Uncle     colon  . Heart disease Sister   . Diabetes Sister   . Hypertension  Sister     Pertinent items are noted in HPI. Weight change: -1.27 kg (-2 lb 12.8 oz)  Intake/Output Summary (Last 24 hours) at 01/25/16 0915 Last data filed at 01/25/16 0912  Gross per 24 hour  Intake    780 ml  Output    402 ml  Net    378 ml   BP 134/58 mmHg  Pulse 74  Temp(Src) 98.2 F (36.8 C) (Oral)  Resp 20  Ht '5\' 10"'$  (1.778 m)  Wt 76.25 kg (168 lb 1.6 oz)  BMI 24.12 kg/m2  SpO2 100% Filed Vitals:   01/24/16 1216 01/24/16 1741 01/24/16 2055 01/25/16 0534  BP: 93/50 116/55 116/52 134/58  Pulse: 70  73 74  Temp: 98.3 F (36.8 C)  99 F (37.2 C) 98.2 F (36.8 C)  TempSrc: Oral  Oral Oral  Resp: '20  20 20  '$ Height:      Weight:    76.25 kg (168 lb 1.6 oz)  SpO2: 98%  98% 100%     General appearance: fatigued, no distress and slowed mentation Head: Normocephalic, without obvious abnormality, atraumatic Resp: clear to auscultation bilaterally Chest wall: no tenderness, pacemaker in right subclavicular area with mild tenderness but no fluctuance or erythema Cardio: no rub and II/VI SEM heard at LUSB GI: soft, non-tender; bowel sounds normal; no masses,  no organomegaly Extremities: extremities normal, atraumatic, no cyanosis or edema  Labs: Basic Metabolic Panel:  Recent Labs Lab 01/19/16 0359 01/20/16 0309 01/21/16 0314 01/22/16 0523 01/23/16 0239 01/24/16 0317 01/25/16 0311  NA 134* 135 132* 131* 128* 131* 130*  K 3.6 3.2* 4.3 3.9 3.7 4.2 4.3  CL 103 100* 100* 101 98* 102 101  CO2 '22 23 24 '$ 20* 20* 20* 20*  GLUCOSE 135* 117* 128* 94 127* 192* 154*  BUN 84* 73* 71* 82* 83* 100* 114*  CREATININE 3.60* 3.02* 3.06* 3.51* 3.69* 4.20* 5.12*  CALCIUM 9.0 9.2 9.0 8.8* 8.5* 8.7* 8.6*   Liver Function Tests: No results for input(s): AST, ALT, ALKPHOS, BILITOT, PROT, ALBUMIN in the last 168 hours. No results for input(s): LIPASE, AMYLASE in the last 168 hours. No results for input(s): AMMONIA in the last 168 hours. CBC:  Recent Labs Lab 01/19/16 0359  01/23/16 0239 01/24/16 0317 01/25/16 0311  WBC 13.3* 9.6 10.0 8.6  HGB 8.8* 7.9* 8.1* 7.3*  HCT 26.5* 22.9* 23.1* 21.3*  MCV 97.8 92.3 91.7 92.6  PLT 109* 169 212 238   PT/INR: '@LABRCNTIP'$ (inr:5) Cardiac Enzymes: )No results for input(s): CKTOTAL, CKMB, CKMBINDEX, TROPONINI in the last 168 hours. CBG:  Recent Labs Lab 01/23/16 1111 01/23/16 1637 01/23/16 2243 01/24/16 2238 01/25/16 0647  GLUCAP 155* 228* 266* 240* 148*    Iron Studies: No results for input(s): IRON, TIBC, TRANSFERRIN, FERRITIN in the last 168 hours.  Xrays/Other Studies: No results found.   Assessment/Plan: 1.  AKI/CKD- in very ill, elderly male with ongoing MSSA bacteremia and likely pacemaker wire involvement and possible endocarditis.  DDx includes ischemic ATN in setting of severe illness, interstitial nephritis from abx, post-infectious GN, or progressive CKD given his advanced CKD at baseline.  His bladder mass is also an issue, although no evidence of obstruction by [place].  Discussed this with mr. Matters and his family.  He admits that he did not have positive feelings about hemodialysis before this hospitalization and is not currently interested at this time.  He is markedly debilitated and agree with his decision not to pursue renal replacement therapy.  Will start workup for AKI/CKD and continue with conservative therapy for now.   2. MSSA bacteremia with involvement of pacemaker wire in RA.  1. Need to renally adjust ancef given his eGFR <11m/min to 1 gram daily or 2 grams every MWF 2. TEE tomorrow 3. Bladder mass- possible blood clot as not vascular supply seen with duplex, however agree with Urology evaluation 4. Prostate cancer s/p xrt and lupron 5. Myelodysplastic syndrome/Anemia of chronic disease and acute illness.  Was on procrit prior to admission.Transfuse prn 6. Hyponatremia - due to #1 as well as poor po intake.  Will start IVF's and follow 7. CAD- cardiology following 8. DM- per primary  svc 9. 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 10. Metabolic acidosis- due to #1 11. Disposition- poor overall prognosis in an elderly, debilitated man with multiple chronic issues.  He is not interested in HD and palliative care consult to help set goals/limits of care would be helpful.   JBroadus JohnA Angelita Harnack 01/25/2016, 9:15 AM

## 2016-01-25 NOTE — Consult Note (Signed)
I have been asked to see the patient by Dr. Catha Brow, for evaluation and management of bladder mass.  History of present illness:  80 year old male with a history of biochemical recurrence status post external beam radiation therapy for his prostate cancer who is currently admitted for multifocal pneumonia with extensive past medical history. Over the past several days he has developed acute on chronic renal failure. Ultrasound done July eighth demonstrated a small masslike object in the patient's bladder with the different diagnosis of bladder tumor versus clot. There is some thought that his worsening renal failure may be due to his obstruction from the mass/clot. Next  The patient has had ongoing hematuria for the past year. He underwent a cystoscopic evaluation in December 2016 by Dr. Noah Delaine which was negative for any evidence of malignancy. The patient has had ongoing hematuria his most recent episode began about one month prior. The patient was currently voiding via a condom catheter. He did have a feeling of incomplete bladder emptying and his most recent bladder scan was 250 mL. The patient is not complaining of any suprapubic pain per say.  Review of systems: A 12 point comprehensive review of systems was obtained and is negative unless otherwise stated in the history of present illness.  Patient Active Problem List   Diagnosis Date Noted  . Pseudogout involving multiple joints 01/24/2016  . Bladder mass 01/24/2016  . Hemoptysis   . Pain   . Wrist swelling   . Staphylococcus aureus bacteremia 01/17/2016  . CAP (community acquired pneumonia) 01/16/2016  . S/P hernia repair 10/28/2015  . Right inguinal hernia 09/09/2015  . GIB (gastrointestinal bleeding) 08/29/2015  . Hematuria 05/19/2015  . Thrombocytopenia ([place]) 11/11/2014  . Leukopenia 11/11/2014  . B12 deficiency 08/31/2013  . Pacemaker 08/26/2013  . On amiodarone therapy 08/21/2013  . Gout due to renal impairment, left  ankle and foot 06/13/2013    Class: Acute  . CAD of autologous bypass graft 06/05/2013  . MDS (myelodysplastic syndrome) ([place]) 05/22/2013  . Anemia of chronic disease 03/19/2013  . CKD (chronic kidney disease), stage IV ([place]) 03/18/2013  . NSVT (nonsustained ventricular tachycardia) ([place]) 03/18/2013  . Chronic diastolic heart failure ([place]) 03/17/2013  . Diabetes mellitus ([place]) 03/17/2013  . Essential hypertension 03/17/2013  . Mesenteric artery stenosis ([place]) 05/01/2012  . PAF (paroxysmal atrial fibrillation) () 12/24/2011  . PAD (peripheral artery disease) ([place]) 12/24/2011  . Chronic vascular insufficiency of intestine (HCC) 12/20/2011    No current facility-administered medications on file prior to encounter.   Current Outpatient Prescriptions on File Prior to Encounter  Medication Sig Dispense Refill  . acetaminophen (ARTHRITIS PAIN RELIEF) 650 MG CR tablet Take 1,300 mg by mouth 3 (three) times daily.     Marland Kitchen amiodarone (PACERONE) 200 MG tablet Take one-half tablet by  mouth daily 45 tablet 2  . aspirin 81 MG tablet Take 1 tablet (81 mg total) by mouth daily.    Marland Kitchen atorvastatin (LIPITOR) 80 MG tablet Take 80 mg by mouth at bedtime.     . Calcifediol ER (RAYALDEE) 30 MCG CPCR Take 30 mcg by mouth at bedtime.    Marland Kitchen CALCIUM-VITAMIN D PO Take 1 capsule by mouth 2 (two) times daily.    . carvedilol (COREG) 25 MG tablet TAKE 1 TABLET BY MOUTH  TWICE DAILY WITH MEALS 180 tablet 2  . cloNIDine (CATAPRES) 0.1 MG tablet Take 0.1 mg by mouth 2 (two) times daily.    . clopidogrel (PLAVIX) 75 MG tablet Take 1  tablet by mouth  daily 90 tablet 2  . cyanocobalamin (,VITAMIN B-12,) 1000 MCG/ML injection Inject 1,000 mcg into the muscle every 30 (thirty) days. Vitamin B12 - last injection 09/01/15    . epoetin alfa (EPOGEN,PROCRIT) 16109 UNIT/ML injection Inject 10,000 Units into the skin every 14 (fourteen) days. Done at [facility] - last injection 09/22/15    . furosemide (LASIX) 80 MG tablet  Take 80 mg by mouth 2 (two) times daily. Pt takes 1 and 1/2 tablet daily. (140 mg) total    . hydrALAZINE (APRESOLINE) 50 MG tablet Take 1 tablet (50 mg total) by mouth 3 (three) times daily. 90 tablet 3  . isosorbide mononitrate (IMDUR) 120 MG 24 hr tablet Take 1 tablet by mouth  daily 90 tablet 2  . Multiple Vitamin (MULTIVITAMIN WITH MINERALS) TABS tablet Take 1 tablet by mouth every morning.    . potassium chloride SA (K-DUR,KLOR-CON) 20 MEQ tablet Take 20-40 mEq by mouth 3 (three) times daily. Take 2 tablets (40 meq) by mouth with breakfast, take 1 tablet (20 meq) with lunch and supper    . PRESCRIPTION MEDICATION Supportive Therapy [facility]    . silodosin (RAPAFLO) 8 MG CAPS capsule Take 8 mg by mouth at bedtime.     Marland Kitchen spironolactone (ALDACTONE) 25 MG tablet Take 25 mg by mouth daily.    Marland Kitchen leuprolide (LUPRON) 30 MG injection Inject 30 mg into the muscle every 4 (four) months.      Past Medical History  Diagnosis Date  . Hypertension   . Hyperlipidemia   . GERD (gastroesophageal reflux disease)   . DVT (deep venous thrombosis) ([place]) 2011    Right arm  . Atrial fibrillation ([place])   . Gout   . Depressive disorder   . Internal hemorrhoids without mention of complication   . Gastroparesis   . Osteopenia   . Ischemic colitis ([place])   . Peripheral artery disease ([place])   . Chronic diastolic heart failure ([place])   . Liddle's syndrome ([place])   . Renal artery stenosis ([place])   . CKD (chronic kidney disease)   . Vitamin B 12 deficiency   . CAD (coronary artery disease)     s/b CABG 1994, and subsequent stents. Repeat CABG 12/2011,  . Prostate cancer ([place]) 1997    XRT and lupron  . Myelodysplastic syndrome ([place]) 05/22/2013    With low hemoglobin and platelets treated with Procrit  . Angiomyolipoma 2009    On both kidneys noted in 2009  . Sick sinus syndrome ([place])   . Peptic ulcer     S/p partial gastrectomy in 1969  . Arthritis     neck and left wrist  . History of hiatal hernia   .  Diabetes mellitus type 2 with peripheral artery disease (HCC)     DIET CONTROLLED  . Pneumonia 01/16/2016  . Presence of permanent cardiac pacemaker     Past Surgical History  Procedure Laterality Date  . Hiatal hernia repair      and ulcer repair  . Appendectomy  1991  . Pacemaker insertion  10/18/1994    DDD pacemaker, [employer] Jude. Gen change 12/10/2003.  Marland Kitchen Coronary artery bypass graft  01/22/1993  . Cholecystectomy  Oct 2009    Laparoscopic  . Coronary artery bypass graft  01/03/2012    Procedure: REDO CORONARY ARTERY BYPASS GRAFTING (CABG);  Surgeon: Gaye Pollack, MD;  Location: [place];  Service: Open Heart Surgery;  Laterality: N/A;  Redo CABG x  using bilateral internal mammary arteries;  left leg greater saphenous vein harvested endoscopically  . Celiac artery angioplasty  05-16-12    and stenting  . Partial gastrectomy  1969    Hx of ulcer s/p partial gastrectomy/ has pernicious anemia  . Other surgical history  02/13/13    superior mesenteric artery angiogram  . Thrombectomy / embolectomy subclavian artery  02/02/10    Right subclavian thromboectomy and venous angioplasty, and chronic mesenteric ischemia with Herculink stenting to superior mesenteric and celiac arteries - Dr. Trula Slade  . Other surgical history  05/16/12    Stent in stomach  . Pacemaker generator change  12/10/2003    SJM Identity XL DR performed by Dr Leonia Reeves  . Visceral angiogram N/A 05/25/2011    Procedure: VISCERAL ANGIOGRAM;  Surgeon: Serafina Mitchell, MD;  Location: [facility] CATH LAB;  Service: Cardiovascular;  Laterality: N/A;  . Abdominal angiogram N/A 05/25/2011    Procedure: ABDOMINAL ANGIOGRAM;  Surgeon: Serafina Mitchell, MD;  Location: [facility] CATH LAB;  Service: Cardiovascular;  Laterality: N/A;  . Lower extremity angiogram Bilateral 05/25/2011    Procedure: LOWER EXTREMITY ANGIOGRAM;  Surgeon: Serafina Mitchell, MD;  Location: [facility] CATH LAB;  Service: Cardiovascular;  Laterality: Bilateral;  . Left heart catheterization  with coronary/graft angiogram  12/24/2011    Procedure: LEFT HEART CATHETERIZATION WITH Beatrix Fetters;  Surgeon: Sinclair Grooms, MD;  Location: [facility] CATH LAB;  Service: Cardiovascular;;  . Visceral angiogram Bilateral 12/28/2011    Procedure: VISCERAL ANGIOGRAM;  Surgeon: Serafina Mitchell, MD;  Location: [facility] CATH LAB;  Service: Cardiovascular;  Laterality: Bilateral;  . Visceral angiogram N/A 05/16/2012    Procedure: VISCERAL ANGIOGRAM;  Surgeon: Serafina Mitchell, MD;  Location: [facility] CATH LAB;  Service: Cardiovascular;  Laterality: N/A;  . Visceral angiogram N/A 02/13/2013    Procedure: VISCERAL ANGIOGRAM;  Surgeon: Serafina Mitchell, MD;  Location: [facility] CATH LAB;  Service: Cardiovascular;  Laterality: N/A;  . Renal angiogram N/A 02/13/2013    Procedure: RENAL ANGIOGRAM;  Surgeon: Serafina Mitchell, MD;  Location: [facility] CATH LAB;  Service: Cardiovascular;  Laterality: N/A;  . Abdominal angiogram N/A 02/13/2013    Procedure: ABDOMINAL ANGIOGRAM;  Surgeon: Serafina Mitchell, MD;  Location: [facility] CATH LAB;  Service: Cardiovascular;  Laterality: N/A;  . Inguinal hernia repair Right 10/28/2015    Procedure: OPEN RIGHT INGUINAL HERNIA REPAIR;  Surgeon: Greer Pickerel, MD;  Location: [facility];  Service: General;  Laterality: Right;  . Insertion of mesh Right 10/28/2015    Procedure: INSERTION OF MESH;  Surgeon: Greer Pickerel, MD;  Location: [facility];  Service: General;  Laterality: Right;  . I&d extremity Left 01/21/2016    Procedure: IRRIGATION AND DEBRIDEMENT EXTREMITY;  Surgeon: Milly Jakob, MD;  Location: [place];  Service: Orthopedics;  Laterality: Left;    Social History  Substance Use Topics  . Smoking status: Former Smoker -- 20 years    Types: Cigarettes    Quit date: 07/12/1969  . Smokeless tobacco: Never Used  . Alcohol Use: No    Family History  Problem Relation Age of [place]set  . Diabetes Mother   . Heart disease Mother     Heart Disease before age 47  . Hyperlipidemia Mother   . Hypertension Mother   .  CVA Mother 30    cause of death  . Cancer Father     stomach/liver  . Hypertension Father     possibly hypertensive  . Cancer Sister  Breast cancer  . Heart disease Daughter     Heart Disease before age 44  . Hypertension Daughter   . Heart attack Daughter   . CAD Brother   . Heart disease Brother   . Cancer Paternal Uncle     colon  . Heart disease Sister   . Diabetes Sister   . Hypertension Sister     PE: Filed Vitals:   01/25/16 1240 01/25/16 1245 01/25/16 1300 01/25/16 1318  BP: 108/56 110/55 100/56 120/52  Pulse: 78 75 78 79  Temp:    98.2 F (36.8 C)  TempSrc:    Oral  Resp: 15 15 18 20   Height:      Weight:      SpO2: 100%  100% 100%   Patient appears to be in no acute distress  patient is alert and oriented x3 Atraumatic normocephalic head No cervical or supraclavicular lymphadenopathy appreciated No increased work of breathing, no audible wheezes/rhonchi Regular sinus rhythm/rate Abdomen is soft, nontender, nondistended, no CVA or suprapubic tenderness Lower extremities are symmetric without appreciable edema Grossly neurologically intact No identifiable skin lesions   Recent Labs  01/23/16 0239 01/24/16 0317 01/25/16 0311  WBC 9.6 10.0 8.6  HGB 7.9* 8.1* 7.3*  HCT 22.9* 23.1* 21.3*    Recent Labs  01/23/16 0239 01/24/16 0317 01/25/16 0311  NA 128* 131* 130*  K 3.7 4.2 4.3  CL 98* 102 101  CO2 20* 20* 20*  GLUCOSE 127* 192* 154*  BUN 83* 100* 114*  CREATININE 3.69* 4.20* 5.12*  CALCIUM 8.5* 8.7* 8.6*   No results for input(s): LABPT, INR in the last 72 hours. No results for input(s): LABURIN in the last 72 hours. Results for orders placed or performed during the hospital encounter of 01/16/16  Blood culture (routine x 2)     Status: Abnormal   Collection Time: 01/16/16  1:00 PM  Result Value Ref Range Status   Specimen Description LEFT ANTECUBITAL  Final   Special Requests BOTTLES DRAWN AEROBIC AND ANAEROBIC 10CC  Final    Culture  Setup Time   Final    GRAM POSITIVE COCCI IN CLUSTERS IN BOTH AEROBIC AND ANAEROBIC BOTTLES CRITICAL RESULT CALLED TO, READ BACK BY AND VERIFIED WITH: J MARKLE 01/17/16 @ 0927 M VESTAL    Culture (A)  Final    STAPHYLOCOCCUS AUREUS SUSCEPTIBILITIES PERFORMED ON PREVIOUS CULTURE WITHIN THE LAST 5 DAYS.    Report Status 01/19/2016 FINAL  Final  Blood culture (routine x 2)     Status: Abnormal   Collection Time: 01/16/16  1:06 PM  Result Value Ref Range Status   Specimen Description BLOOD LEFT HAND  Final   Special Requests AEROBIC BOTTLE ONLY 10CC  Final   Culture  Setup Time   Final    GRAM POSITIVE COCCI IN CLUSTERS AEROBIC BOTTLE ONLY CRITICAL RESULT CALLED TO, READ BACK BY AND VERIFIED WITH: J MARKLE 01/17/16 @ 0927 M VESTAL    Culture STAPHYLOCOCCUS AUREUS (A)  Final   Report Status 01/19/2016 FINAL  Final   Organism ID, Bacteria STAPHYLOCOCCUS AUREUS  Final      Susceptibility   Staphylococcus aureus - MIC*    CIPROFLOXACIN >=8 RESISTANT Resistant     ERYTHROMYCIN <=0.25 SENSITIVE Sensitive     GENTAMICIN <=0.5 SENSITIVE Sensitive     OXACILLIN 0.5 SENSITIVE Sensitive     TETRACYCLINE <=1 SENSITIVE Sensitive     VANCOMYCIN <=0.5 SENSITIVE Sensitive     TRIMETH/SULFA <=10 SENSITIVE Sensitive  CLINDAMYCIN <=0.25 SENSITIVE Sensitive     RIFAMPIN <=0.5 SENSITIVE Sensitive     Inducible Clindamycin NEGATIVE Sensitive     * STAPHYLOCOCCUS AUREUS  Blood Culture ID Panel (Reflexed)     Status: Abnormal   Collection Time: 01/16/16  1:06 PM  Result Value Ref Range Status   Enterococcus species NOT DETECTED NOT DETECTED Final   Vancomycin resistance NOT DETECTED NOT DETECTED Final   Listeria monocytogenes NOT DETECTED NOT DETECTED Final   Staphylococcus species DETECTED (A) NOT DETECTED Final    Comment: CRITICAL RESULT CALLED TO, READ BACK BY AND VERIFIED WITH: J MARKLE 01/17/16 @ 0927 M VESTAL    Staphylococcus aureus DETECTED (A) NOT DETECTED Final    Comment:  CRITICAL RESULT CALLED TO, READ BACK BY AND VERIFIED WITH: J MARKLE 01/17/16 @ 0927 M VESTAL    Methicillin resistance NOT DETECTED NOT DETECTED Final   Streptococcus species NOT DETECTED NOT DETECTED Final   Streptococcus agalactiae NOT DETECTED NOT DETECTED Final   Streptococcus pneumoniae NOT DETECTED NOT DETECTED Final   Streptococcus pyogenes NOT DETECTED NOT DETECTED Final   Acinetobacter baumannii NOT DETECTED NOT DETECTED Final   Enterobacteriaceae species NOT DETECTED NOT DETECTED Final   Enterobacter cloacae complex NOT DETECTED NOT DETECTED Final   Escherichia coli NOT DETECTED NOT DETECTED Final   Klebsiella oxytoca NOT DETECTED NOT DETECTED Final   Klebsiella pneumoniae NOT DETECTED NOT DETECTED Final   Proteus species NOT DETECTED NOT DETECTED Final   Serratia marcescens NOT DETECTED NOT DETECTED Final   Carbapenem resistance NOT DETECTED NOT DETECTED Final   Haemophilus influenzae NOT DETECTED NOT DETECTED Final   Neisseria meningitidis NOT DETECTED NOT DETECTED Final   Pseudomonas aeruginosa NOT DETECTED NOT DETECTED Final   Candida albicans NOT DETECTED NOT DETECTED Final   Candida glabrata NOT DETECTED NOT DETECTED Final   Candida krusei NOT DETECTED NOT DETECTED Final   Candida parapsilosis NOT DETECTED NOT DETECTED Final   Candida tropicalis NOT DETECTED NOT DETECTED Final  Culture, sputum-assessment     Status: None   Collection Time: 01/16/16 10:24 PM  Result Value Ref Range Status   Specimen Description EXPECTORATED SPUTUM  Final   Special Requests NONE  Final   Sputum evaluation   Final    THIS SPECIMEN IS ACCEPTABLE. RESPIRATORY CULTURE REPORT TO FOLLOW.   Report Status 01/17/2016 FINAL  Final  Culture, respiratory (NON-Expectorated)     Status: None   Collection Time: 01/16/16 10:24 PM  Result Value Ref Range Status   Specimen Description EXPECTORATED SPUTUM  Final   Special Requests NONE  Final   Gram Stain   Final    RARE SQUAMOUS EPITHELIAL CELLS  PRESENT ABUNDANT WBC PRESENT,BOTH PMN AND MONONUCLEAR FEW GRAM POSITIVE COCCI IN PAIRS IN CLUSTERS    Culture Consistent with normal respiratory flora.  Final   Report Status 01/19/2016 FINAL  Final  Culture, blood (routine x 2)     Status: None   Collection Time: 01/17/16  9:50 AM  Result Value Ref Range Status   Specimen Description BLOOD BLOOD LEFT ARM  Final   Special Requests BOTTLES DRAWN AEROBIC ONLY 5CC  Final   Culture NO GROWTH 5 DAYS  Final   Report Status 01/22/2016 FINAL  Final  Culture, blood (routine x 2)     Status: None   Collection Time: 01/17/16  9:50 AM  Result Value Ref Range Status   Specimen Description BLOOD LEFT ANTECUBITAL  Final   Special Requests BOTTLES DRAWN AEROBIC ONLY  5CC  Final   Culture NO GROWTH 5 DAYS  Final   Report Status 01/22/2016 FINAL  Final  Acid Fast Smear (AFB)     Status: None   Collection Time: 01/17/16 11:26 AM  Result Value Ref Range Status   AFB Specimen Processing Concentration  Final   Acid Fast Smear Negative  Final    Comment: (NOTE) Performed At: [facility] [place], [place] [id number] Lindon Romp MD [id number]    Source (AFB) SPUTUM  Final  Body fluid culture     Status: None   Collection Time: 01/19/16  4:37 PM  Result Value Ref Range Status   Specimen Description FLUID SYNOVIAL WRIST LEFT  Final   Special Requests NONE  Final   Gram Stain   Final    ABUNDANT WBC PRESENT,BOTH PMN AND MONONUCLEAR NO ORGANISMS SEEN    Culture NO GROWTH 3 DAYS  Final   Report Status 01/22/2016 FINAL  Final  Aerobic/Anaerobic Culture (surgical/deep wound)     Status: None (Preliminary result)   Collection Time: 01/21/16  6:52 PM  Result Value Ref Range Status   Specimen Description WOUND LEFT WRIST  Final   Special Requests NONE  Final   Gram Stain   Final    WBC PRESENT,BOTH PMN AND MONONUCLEAR NO ORGANISMS SEEN    Culture   Final    NO GROWTH 2 DAYS NO ANAEROBES ISOLATED; CULTURE IN PROGRESS FOR  5 DAYS   Report Status PENDING  Incomplete    Imaging: I've independently reviewed the patient's ultrasound from July 8 demonstrating a area of concern in the posterior aspect of his bladder consistent with clot versus tumor. There is no hydronephrosis.   I performed cystoscopy, please see procedure note for more details. The view was severely limited because of the extensive amount of clot within the patient's bladder. I did evacuate a large amount of this however, there remains several large organized clots that I was unable to evacuate through the scope. A three-way Foley catheter was then placed and attempted irrigation again unsuccessfully. Continuous bladder irrigation was left, this cleared his urine fairly quickly.   Imp: The patient has hemorrhagic cystitis with large formed slashed organized clots that his bladder. So these were unable to be evacuated via the scope or the large hematuria catheter placed. Although his visualization was limited I did not see any large tumors, he had a negative cystoscopy 6 months prior. He may have had some obstruction which could potentially have led to some of his worsening renal function.  I don't think the patient is currently bleeding.   Recommendations: I have left a 24 [place] three-way Foley catheter. I instructed the nurse to the inflow after the current bag of saline has run out. If the catheter starts to become occluded requiring hand irrigation he may need to be taken to the operating room for clot evacuation. However, these also may lyse on the road over time.  Thank you for involving me in this patient's care, we will continue to follow along.   Louis Meckel W

## 2016-01-25 NOTE — Progress Notes (Addendum)
Pharmacy Antibiotic Note  [name]. is a 80 y.o. male admitted on 01/16/2016 with multifocal pna per chest CT and BCID + MSSA bacteremia. Pt also has pacemaker.  Pharmacy was consulted on 01/17/16 for cefazolin dosing. For removal of pacemaker on Tuesday and TEE on 7/17.  CKD, Scr up to 5.12. CrCl ~10 ml/min.   Plan: -Change ancef to 1gm IV q24h -Will follow renal trend and patient progress  Height: 5\' 10"  (177.8 cm) Weight: 168 lb 1.6 oz (76.25 kg) IBW/kg (Calculated) : 73  Temp (24hrs), Avg:98.5 F (36.9 C), Min:98.2 F (36.8 C), Max:99 F (37.2 C)   Recent Labs Lab 01/19/16 0359  01/21/16 0314 01/22/16 0523 01/23/16 0239 01/24/16 0317 01/25/16 0311  WBC 13.3*  --   --   --  9.6 10.0 8.6  CREATININE 3.60*  < > 3.06* 3.51* 3.69* 4.20* 5.12*  < > = values in this interval not displayed.  Estimated Creatinine Clearance: 12.3 mL/min (by C-G formula based on Cr of 5.12).    Allergies  Allergen Reactions  . Nsaids Other (See Comments)    GI issue  . Ibuprofen Other (See Comments)    GI Issues  . Ace Inhibitors Cough    Antimicrobials this admission: Azith 7/7 x1 Rocephin 7/7 x1 Cefazolin 7/8 >> Rifampin 7/8 >>   Dose adjustments this admission: none  Microbiology results: 7/7 BCx x2: MSSA 2/2 7/8 BCx x2: neg/F 7/10 left wrist synovial fluid cx: neg/F 7/12 wound left wrist: ngtd  Hildred Laser, Pharm D 01/25/2016 2:59 PM

## 2016-01-25 NOTE — Progress Notes (Signed)
Bladder scan performed as ordered- shows 260 cc's urine present in bladder. Urine remains very bloody

## 2016-01-25 NOTE — Procedures (Signed)
Preprocedure diagnosis: Gross hematuria Postprocedure diagnosis: Same  Procedure performed: Cystoscopy, bladder clot evacuation.  Insertion of 24 [place] three-way Foley catheter with hand irrigation of clot and initiation of continuous bladder irrigation. Next  Indications: 80 year old male with suspected tumor versus clot in the patient's bladder. He has worsening renal sufficiency and request was made for further investigation of the tumor versus clot and what this could be treated to his renal failure.  Procedure: Informed so was obtained. Betadine was used to prep the tip of the patient's penis. 2% lidocaine jelly was then injected into the patient's urethra. A frontal cystoscope was then jelly passed through the patient's urethra and the bladder. Visualization was severely limited because of the cloudy/old blood within the bladder. This was evacuated and the bladder was then refilled. There were several large formed clots that were too large to be evacuated through the scope. I was able to move around this clots and noted no significant tumors. I was unable to visualize the ureteral orifices or the posterior bladder wall. I then removed the scope and gently passed and a 24 [place] Ainsworth three-way Foley catheter. I again attempted hand irrigation with no success at removing any additional clot. I placed the patient on continuous bladder irrigation and the reflux was clear. The patient tolerated the procedure well.

## 2016-01-26 ENCOUNTER — Inpatient Hospital Stay (HOSPITAL_COMMUNITY): Payer: Medicare Other

## 2016-01-26 ENCOUNTER — Other Ambulatory Visit: Payer: Medicare Other

## 2016-01-26 ENCOUNTER — Encounter (HOSPITAL_COMMUNITY): Payer: Self-pay

## 2016-01-26 ENCOUNTER — Ambulatory Visit: Payer: Medicare Other

## 2016-01-26 ENCOUNTER — Encounter (HOSPITAL_COMMUNITY)
Admission: EM | Disposition: A | Discharge status: Skilled Nursing Facility | Payer: Self-pay | Source: Home / Self Care | Attending: Internal Medicine

## 2016-01-26 DIAGNOSIS — N179 Acute kidney failure, unspecified: Secondary | ICD-10-CM | POA: Diagnosis present

## 2016-01-26 DIAGNOSIS — R7881 Bacteremia: Secondary | ICD-10-CM

## 2016-01-26 DIAGNOSIS — N184 Chronic kidney disease, stage 4 (severe): Secondary | ICD-10-CM

## 2016-01-26 HISTORY — PX: TEE WITHOUT CARDIOVERSION: SHX5443

## 2016-01-26 LAB — URINALYSIS, ROUTINE W REFLEX MICROSCOPIC
GLUCOSE, UA: NEGATIVE mg/dL
KETONES UR: 15 mg/dL — AB
NITRITE: POSITIVE — AB
PH: 5 (ref 5.0–8.0)
Protein, ur: >300 mg/dL — AB
Specific Gravity, Urine: 1.023 (ref 1.005–1.030)

## 2016-01-26 LAB — AEROBIC/ANAEROBIC CULTURE W GRAM STAIN (SURGICAL/DEEP WOUND): Culture: NO GROWTH

## 2016-01-26 LAB — RENAL FUNCTION PANEL
ALBUMIN: 2 g/dL — AB (ref 3.5–5.0)
Anion gap: 9 (ref 5–15)
BUN: 123 mg/dL — ABNORMAL HIGH (ref 6–20)
CHLORIDE: 102 mmol/L (ref 101–111)
CO2: 20 mmol/L — ABNORMAL LOW (ref 22–32)
Calcium: 8.2 mg/dL — ABNORMAL LOW (ref 8.9–10.3)
Creatinine, Ser: 5.71 mg/dL — ABNORMAL HIGH (ref 0.61–1.24)
GFR, EST AFRICAN AMERICAN: 10 mL/min — AB (ref >60)
GFR, EST NON AFRICAN AMERICAN: 9 mL/min — AB (ref >60)
Glucose, Bld: 197 mg/dL — ABNORMAL HIGH (ref 65–99)
PHOSPHORUS: 4.1 mg/dL (ref 2.5–4.6)
POTASSIUM: 4.5 mmol/L (ref 3.5–5.1)
Sodium: 131 mmol/L — ABNORMAL LOW (ref 135–145)

## 2016-01-26 LAB — CREATININE, URINE, RANDOM: Creatinine, Urine: 97.37 mg/dL

## 2016-01-26 LAB — GLUCOSE, CAPILLARY
Glucose-Capillary: 180 mg/dL — ABNORMAL HIGH (ref 65–99)
Glucose-Capillary: 192 mg/dL — ABNORMAL HIGH (ref 65–99)
Glucose-Capillary: 260 mg/dL — ABNORMAL HIGH (ref 65–99)
Glucose-Capillary: 246 mg/dL — ABNORMAL HIGH (ref 65–99)

## 2016-01-26 LAB — KAPPA/LAMBDA LIGHT CHAINS
KAPPA FREE LGHT CHN: 210.5 mg/L — AB (ref 3.3–19.4)
Kappa, lambda light chain ratio: 2.38 — ABNORMAL HIGH (ref 0.26–1.65)
LAMDA FREE LIGHT CHAINS: 88.6 mg/L — AB (ref 5.7–26.3)

## 2016-01-26 LAB — CBC
HEMATOCRIT: 20.5 % — AB (ref 39.0–52.0)
HEMOGLOBIN: 7 g/dL — AB (ref 13.0–17.0)
MCH: 31.8 pg (ref 26.0–34.0)
MCHC: 34.1 g/dL (ref 30.0–36.0)
MCV: 93.2 fL (ref 78.0–100.0)
Platelets: 251 10*3/uL (ref 150–400)
RBC: 2.2 MIL/uL — AB (ref 4.22–5.81)
RDW: 17.3 % — ABNORMAL HIGH (ref 11.5–15.5)
WBC: 8.6 10*3/uL (ref 4.0–10.5)

## 2016-01-26 LAB — AEROBIC/ANAEROBIC CULTURE (SURGICAL/DEEP WOUND)

## 2016-01-26 LAB — SODIUM, URINE, RANDOM: Sodium, Ur: <10 mmol/L

## 2016-01-26 LAB — C3 COMPLEMENT: C3 Complement: 139 mg/dL (ref 82–167)

## 2016-01-26 LAB — C4 COMPLEMENT: Complement C4, Body Fluid: 48 mg/dL — ABNORMAL HIGH (ref 14–44)

## 2016-01-26 LAB — URINE MICROSCOPIC-ADD ON

## 2016-01-26 LAB — ANTISTREPTOLYSIN O TITER: ASO: 61 IU/mL (ref 0.0–200.0)

## 2016-01-26 LAB — PREPARE RBC (CROSSMATCH)

## 2016-01-26 SURGERY — ECHOCARDIOGRAM, TRANSESOPHAGEAL
Anesthesia: Moderate Sedation

## 2016-01-26 MED ORDER — SODIUM CHLORIDE 0.9% FLUSH: 10.0000 mL | INTRAVENOUS | Status: DC | PRN

## 2016-01-26 MED ORDER — FENTANYL CITRATE (PF) 100 MCG/2ML IJ SOLN
INTRAMUSCULAR | Status: AC
  Filled 2016-01-26: qty 2

## 2016-01-26 MED ORDER — SODIUM CHLORIDE 0.9 % IV SOLN: Freq: Once | INTRAVENOUS | Status: DC

## 2016-01-26 MED ORDER — PERFLUTREN LIPID MICROSPHERE
INTRAVENOUS | Status: AC
  Filled 2016-01-26: qty 10

## 2016-01-26 MED ORDER — CALCIFEDIOL ER 30 MCG PO CPCR
30.0000 ug | ORAL_CAPSULE | Freq: Every day | ORAL | Status: DC
  Administered 2016-01-27 – 2016-02-02 (×7): 30 ug via ORAL
  Filled 2016-01-26 (×8): qty 1

## 2016-01-26 MED ORDER — PERFLUTREN LIPID MICROSPHERE
INTRAVENOUS | Status: DC | PRN
  Administered 2016-01-26: 2 mL via INTRAVENOUS

## 2016-01-26 MED ORDER — SODIUM CHLORIDE 0.9 % IV SOLN
Freq: Once | INTRAVENOUS | Status: AC
  Administered 2016-01-26: 18:00:00 via INTRAVENOUS

## 2016-01-26 MED ORDER — MIDAZOLAM HCL 5 MG/ML IJ SOLN
INTRAMUSCULAR | Status: AC
  Filled 2016-01-26: qty 2

## 2016-01-26 MED ORDER — FENTANYL CITRATE (PF) 100 MCG/2ML IJ SOLN
INTRAMUSCULAR | Status: DC | PRN
  Administered 2016-01-26: 25 ug via INTRAVENOUS

## 2016-01-26 MED ORDER — MIDAZOLAM HCL 10 MG/2ML IJ SOLN
INTRAMUSCULAR | Status: DC | PRN
  Administered 2016-01-26: 2 mg via INTRAVENOUS
  Administered 2016-01-26 (×2): 1 mg via INTRAVENOUS

## 2016-01-26 MED ORDER — BUTAMBEN-TETRACAINE-BENZOCAINE 2-2-14 % EX AERO
INHALATION_SPRAY | CUTANEOUS | Status: DC | PRN
  Administered 2016-01-26: 2 via TOPICAL

## 2016-01-26 MED ORDER — SODIUM CHLORIDE 0.9 % IV BOLUS (SEPSIS)
500.0000 mL | Freq: Once | INTRAVENOUS | Status: AC
  Administered 2016-01-26: 500 mL via INTRAVENOUS

## 2016-01-26 NOTE — Progress Notes (Addendum)
[facility]   Reason for visit: Follow up on pacemaker lead vegetation with MSSA bacteremia  Interval History: repeated blood cultures remain negative; afebrile.  TEE without cardiac vegetation.  Plan for PM removal in 1-2 days.    Physical Exam: Constitutional:  Filed Vitals:   01/26/16 0950 01/26/16 1156  BP: 164/56 84/47  Pulse: 74 77  Temp:  98.7 F (37.1 C)  Resp: 17 18   patient appears in NAD Respiratory: Normal respiratory effort; CTA B Cardiovascular: RRR  Review of Systems: Constitutional: negative for fevers and chills Cardiovascular: negative for chest pain Gastrointestinal: negative for diarrhea  Lab Results  Component Value Date   WBC 8.6 01/26/2016   HGB 7.0* 01/26/2016   HCT 20.5* 01/26/2016   MCV 93.2 01/26/2016   PLT 251 01/26/2016    Lab Results  Component Value Date   CREATININE 5.71* 01/26/2016   BUN 123* 01/26/2016   NA 131* 01/26/2016   K 4.5 01/26/2016   CL 102 01/26/2016   CO2 20* 01/26/2016    Lab Results  Component Value Date   ALT 12* 01/16/2016   AST 30 01/16/2016   ALKPHOS 27* 01/16/2016     Microbiology: Recent Results (from the past 240 hour(s))  Blood culture (routine x 2)     Status: Abnormal   Collection Time: 01/16/16  1:00 PM  Result Value Ref Range Status   Specimen Description LEFT ANTECUBITAL  Final   Special Requests BOTTLES DRAWN AEROBIC AND ANAEROBIC 10CC  Final   Culture  Setup Time   Final    GRAM POSITIVE COCCI IN CLUSTERS IN BOTH AEROBIC AND ANAEROBIC BOTTLES CRITICAL RESULT CALLED TO, READ BACK BY AND VERIFIED WITH: J MARKLE 01/17/16 @ 0927 M VESTAL    Culture (A)  Final    STAPHYLOCOCCUS AUREUS SUSCEPTIBILITIES PERFORMED ON PREVIOUS CULTURE WITHIN THE LAST 5 DAYS.    Report Status 01/19/2016 FINAL  Final  Blood culture (routine x 2)     Status: Abnormal   Collection Time: 01/16/16  1:06 PM  Result Value Ref Range Status   Specimen Description BLOOD LEFT HAND  Final   Special Requests AEROBIC BOTTLE ONLY 10CC  Final   Culture  Setup Time   Final    GRAM POSITIVE COCCI IN CLUSTERS AEROBIC BOTTLE ONLY CRITICAL RESULT CALLED TO, READ BACK BY AND VERIFIED WITH: J MARKLE 01/17/16 @ 0927 M VESTAL    Culture STAPHYLOCOCCUS AUREUS (A)  Final   Report Status 01/19/2016 FINAL  Final   Organism ID, Bacteria STAPHYLOCOCCUS AUREUS  Final      Susceptibility   Staphylococcus aureus - MIC*    CIPROFLOXACIN >=8 RESISTANT Resistant     ERYTHROMYCIN <=0.25 SENSITIVE Sensitive     GENTAMICIN <=0.5 SENSITIVE Sensitive     OXACILLIN 0.5 SENSITIVE Sensitive     TETRACYCLINE <=1 SENSITIVE Sensitive     VANCOMYCIN <=0.5 SENSITIVE Sensitive     TRIMETH/SULFA <=10 SENSITIVE Sensitive     CLINDAMYCIN <=0.25 SENSITIVE Sensitive     RIFAMPIN <=0.5 SENSITIVE Sensitive     Inducible Clindamycin NEGATIVE Sensitive     * STAPHYLOCOCCUS AUREUS  Blood Culture ID Panel (Reflexed)     Status: Abnormal   Collection Time: 01/16/16  1:06 PM  Result Value Ref Range Status   Enterococcus species NOT DETECTED NOT DETECTED Final   Vancomycin resistance NOT DETECTED NOT DETECTED Final   Listeria monocytogenes NOT DETECTED NOT DETECTED Final   Staphylococcus species DETECTED (A) NOT  DETECTED Final    Comment: CRITICAL RESULT CALLED TO, READ BACK BY AND VERIFIED WITH: J MARKLE 01/17/16 @ 0927 M VESTAL    Staphylococcus aureus DETECTED (A) NOT DETECTED Final    Comment: CRITICAL RESULT CALLED TO, READ BACK BY AND VERIFIED WITH: J MARKLE 01/17/16 @ 0927 M VESTAL    Methicillin resistance NOT DETECTED NOT DETECTED Final   Streptococcus species NOT DETECTED NOT DETECTED Final   Streptococcus agalactiae NOT DETECTED NOT DETECTED Final   Streptococcus pneumoniae NOT DETECTED NOT DETECTED Final   Streptococcus pyogenes NOT DETECTED NOT DETECTED Final   Acinetobacter baumannii NOT DETECTED NOT DETECTED Final   Enterobacteriaceae species NOT DETECTED NOT DETECTED Final   Enterobacter cloacae  complex NOT DETECTED NOT DETECTED Final   Escherichia coli NOT DETECTED NOT DETECTED Final   Klebsiella oxytoca NOT DETECTED NOT DETECTED Final   Klebsiella pneumoniae NOT DETECTED NOT DETECTED Final   Proteus species NOT DETECTED NOT DETECTED Final   Serratia marcescens NOT DETECTED NOT DETECTED Final   Carbapenem resistance NOT DETECTED NOT DETECTED Final   Haemophilus influenzae NOT DETECTED NOT DETECTED Final   Neisseria meningitidis NOT DETECTED NOT DETECTED Final   Pseudomonas aeruginosa NOT DETECTED NOT DETECTED Final   Candida albicans NOT DETECTED NOT DETECTED Final   Candida glabrata NOT DETECTED NOT DETECTED Final   Candida krusei NOT DETECTED NOT DETECTED Final   Candida parapsilosis NOT DETECTED NOT DETECTED Final   Candida tropicalis NOT DETECTED NOT DETECTED Final  Culture, sputum-assessment     Status: None   Collection Time: 01/16/16 10:24 PM  Result Value Ref Range Status   Specimen Description EXPECTORATED SPUTUM  Final   Special Requests NONE  Final   Sputum evaluation   Final    THIS SPECIMEN IS ACCEPTABLE. RESPIRATORY CULTURE REPORT TO FOLLOW.   Report Status 01/17/2016 FINAL  Final  Culture, respiratory (NON-Expectorated)     Status: None   Collection Time: 01/16/16 10:24 PM  Result Value Ref Range Status   Specimen Description EXPECTORATED SPUTUM  Final   Special Requests NONE  Final   Gram Stain   Final    RARE SQUAMOUS EPITHELIAL CELLS PRESENT ABUNDANT WBC PRESENT,BOTH PMN AND MONONUCLEAR FEW GRAM POSITIVE COCCI IN PAIRS IN CLUSTERS    Culture Consistent with normal respiratory flora.  Final   Report Status 01/19/2016 FINAL  Final  Culture, blood (routine x 2)     Status: None   Collection Time: 01/17/16  9:50 AM  Result Value Ref Range Status   Specimen Description BLOOD BLOOD LEFT ARM  Final   Special Requests BOTTLES DRAWN AEROBIC ONLY 5CC  Final   Culture NO GROWTH 5 DAYS  Final   Report Status 01/22/2016 FINAL  Final  Culture, blood (routine x  2)     Status: None   Collection Time: 01/17/16  9:50 AM  Result Value Ref Range Status   Specimen Description BLOOD LEFT ANTECUBITAL  Final   Special Requests BOTTLES DRAWN AEROBIC ONLY 5CC  Final   Culture NO GROWTH 5 DAYS  Final   Report Status 01/22/2016 FINAL  Final  Acid Fast Smear (AFB)     Status: None   Collection Time: 01/17/16 11:26 AM  Result Value Ref Range Status   AFB Specimen Processing Concentration  Final   Acid Fast Smear Negative  Final    Comment: (NOTE) Performed At: [facility] - [address] [place], [place] [id number] Lindon Romp MD [id number]    Source (AFB) SPUTUM  Final  Body fluid culture     Status: None   Collection Time: 01/19/16  4:37 PM  Result Value Ref Range Status   Specimen Description FLUID SYNOVIAL WRIST LEFT  Final   Special Requests NONE  Final   Gram Stain   Final    ABUNDANT WBC PRESENT,BOTH PMN AND MONONUCLEAR NO ORGANISMS SEEN    Culture NO GROWTH 3 DAYS  Final   Report Status 01/22/2016 FINAL  Final  Aerobic/Anaerobic Culture (surgical/deep wound)     Status: None (Preliminary result)   Collection Time: 01/21/16  6:52 PM  Result Value Ref Range Status   Specimen Description WOUND LEFT WRIST  Final   Special Requests NONE  Final   Gram Stain   Final    WBC PRESENT,BOTH PMN AND MONONUCLEAR NO ORGANISMS SEEN    Culture   Final    NO GROWTH 2 DAYS NO ANAEROBES ISOLATED; CULTURE IN PROGRESS FOR 5 DAYS   Report Status PENDING  Incomplete    Impression/Plan:  1. 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  I agree with replacement on or after 7/22.  Cefazolin and rifampin.  2. Bacteremia - from #1.  Cleared blood cultures.  Will need IV cefazolin through 02/12/2016.  Likely will need line alternative to PICC with kidney disease.

## 2016-01-26 NOTE — Progress Notes (Addendum)
Progress Note    [name]  IPJ:[id number] DOB: Nov 21, 1935  DOA: 01/16/2016 PCP: [name], MD    Brief Narrative:   [name]. is an 80 y.o. male with a PMH of paroxysmal atrial fibrillation/pacemaker not on anticoagulation secondary to history of GI bleed, CAD, stage IV CKD, CAD, hypertension,Liddle's syndrome, myelodysplastic syndrome, PVD and hyperlipidemia who was admitted 01/16/16 with multifocal pneumonia complicated by coagulopathy and hematuria. Blood cultures were positive for MSSA. Patient subsequently developed severe wrist pain and underwent arthrotomy of left wrist joint 01/21/16. Pseudogout suspected given negative synovial fluid cultures. Echocardiogram done which showed echodensity on RA lead.  Assessment/Plan:   Principal Problem:   Staphylococcus aureus bacteremia with pacemaker endocarditis, possible septic arthritis and right lower lobe pneumonia/Multifocal pneumonia Will need pacemaker extraction. Per ID recs: Current guidelines state that he should be safe to replace a new pacemaker 14 days after the first negative blood culture (that would be 01/31/2016). He will need a total of 4 weeks of antibiotic therapy through 02/12/2016.He is currently on rifampin and Ancef, which is being renally adjusted per nephrology recommendations. Interventional radiology placed a cuffed central catheter on 01/25/16 after surveillance cultures were negative. TEE done 01/26/16 with no evidence of vegetation.  Active Problems:   Bladder mass Renal ultrasound done 01/17/16. 5 cm masslike area consistent with a blood clot. Bladder tumor could not be completely ruled out.We'll have RN bladder scan to rule out obstruction given his mass/clot since his creatinine is rising. Evaluated by urology with a cystoscopy performed, status post bladder clot evacuation and bladder irrigation.    Coronary artery disease/chronic diastolic CHF  Status post CABG. Aspirin/Plavix  currently on hold secondary to hematuria and hemoptysis. Continue Lipitor, Imdur and Coreg. No evidence of heart failure exacerbation. Diuretics currently on hold. Monitor daily weights and I/O.    PAF (paroxysmal atrial fibrillation) (HCC) status post pacemaker for sick sinus syndrome CHA2DS2Vasc is at least 4, but not felt to be a candidate for anticoagulation given history of GI bleeding. Continue amiodarone.    Acute renal failure superimposed on stage IV chronic kidney disease/renal artery stenosis Baseline creatinine 3.8. Creatinine rising. Discussed case with Dr. Marval Regal who has seen the patient in consultation. Agree with gentle IV fluids per nephrology recommendations. Creatinine continues to deteriorate. Differential remains broad with ATN, interstitial nephritis, postinfectious GN or progressive CKD all possible. Continue management per nephrology.    Wrist swelling secondary to probable pseudogout, synovial fluid cultures pending Seen by orthopedics, status post incision and drainage 01/21/16. Operative synovial fluid Gram stain was negative for organisms. CPPD crystals noted. Findings most consistent with pseudogout. Continue prednisone. Follow-up cultures. Will follow-up with Dr. Grandville Silos.    Liddle's syndrome Monitor for hypokalemia and high blood pressure.    Myelodysplastic syndrome/anemia of chronic disease Gets Epogen injections every 2 weeks at the cancer center. Hemoglobin trending down. Continue to monitor. Hemoglobin is 7 today, we'll try been crossing give 1 unit of PRBCs.    Diabetes mellitus with peripheral artery disease Diet controlled. CBGs 148-280. Change SSI to moderate scale with 4 units of meal coverage. Steroids may make glycemic control harder to achieve.    Gastroparesis    Hypertension Continue Coreg and clonidine. Lasix and spironolactone on hold.    Prostate cancer Gets Lupron injections every 4 months. S/P external beam radiation.   Family  Communication/Anticipated D/C date and plan/Code Status   DVT prophylaxis: SCDs ordered. Code Status: Full Code.  Family  Communication: Wife and multiple other family members updated at the bedside. Disposition Plan: Home when medically stable, likely several more days given current sepsis/pacemaker endocarditis.   Medical Consultants:    Cardiology  Orthopedic Surgery  Infectious Disease  Nephrology  Urology   Procedures:   2 D Echo 01/24/16:  Dynamic obstruction with peak velocity of 351 cm/sec. no regional wall motion abnormalities. Grade 1 diastolic dysfunction. Mobile echodensity in the right atrium attached to pacer wire. Mild systolic pulmonary artery hypertension.  Dg Chest 2 View  01/16/2016  CLINICAL DATA:  Chest pain, cough for 2 days EXAM: CHEST  2 VIEW COMPARISON:  CT chest 04/03/2014 FINDINGS: Cardiomediastinal silhouette is unremarkable. Status post median sternotomy. Dual lead cardiac pacemaker with leads in right atrium and right ventricle. There is streaky airspace opacification in right base infrahilar region and right upper lobe peripheral perihilar. Findings highly suspicious for pneumonia. Probable small right pleural effusion. Left lung is clear. IMPRESSION: There is streaky airspace opacification in right base infrahilar region and right upper lobe peripheral perihilar. Findings highly suspicious for pneumonia. Followup PA and lateral chest X-ray is recommended in 3-4 weeks following trial of antibiotic therapy to ensure resolution and exclude underlying malignancy. Probable small right pleural effusion. Electronically Signed   By: Lahoma Crocker M.D.   On: 01/16/2016 13:58   Dg Wrist 2 Views Left  01/18/2016  CLINICAL DATA:  Acute onset of left wrist pain.  Initial encounter. EXAM: LEFT WRIST - 2 VIEW COMPARISON:  None. FINDINGS: There is no evidence of fracture or dislocation. There is joint space narrowing along the radiocarpal joint, and at the radial aspect of the  carpal rows. This resolves in mild chronic deformity of the scaphoid and lunate, with expansion of the scapholunate distance to 5 mm. Scattered vascular calcifications are seen. No additional soft tissue abnormalities are characterized on radiograph. IMPRESSION: 1. No evidence of fracture or dislocation. 2. Degenerative joint space narrowing along the radiocarpal joints, and at the radial aspect of the carpal rows. This results in mild chronic deformity of the scaphoid and lunate, with expansion of the scapholunate distance to 5 mm, reflecting scapholunate dissociation. Electronically Signed   By: Garald Balding M.D.   On: 01/18/2016 16:57   Ct Chest Wo Contrast  01/16/2016  CLINICAL DATA:  Hemoptysis, cough.  Symptoms since Monday. EXAM: CT CHEST WITHOUT CONTRAST TECHNIQUE: TECHNIQUE Multidetector CT imaging of the chest was performed following the standard protocol without IV contrast. COMPARISON:  Chest radiograph 01/16/2016, CT 04/03/2014 FINDINGS: Cardiovascular: Calcification of the thoracic aorta. Coronary bypass graft. Pacer wires in the RIGHT heart. No pericardial fluid. Mediastinum/Nodes: No axillary or supraclavicular adenopathy. No mediastinal hilar adenopathy. Esophagus normal. Lungs/Pleura: There is consolidative airspace disease in the RIGHT lower lobe with air bronchograms. A similar pattern in the posterior aspect the RIGHT upper lobe. Smaller pattern of consolidation in the LEFT lower lobe. Findings are consistent with multifocal pneumonia. Upper Abdomen: Limited view of the liver, kidneys, pancreas are unremarkable. Normal adrenal glands. Musculoskeletal: No aggressive osseous lesion.  Midline sternotomy. IMPRESSION: 1. Multifocal pneumonia with most dense infection in RIGHT lower lobe. 2. Coronary artery calcification and aortic atherosclerotic calcification. Electronically Signed   By: Suzy Bouchard M.D.   On: 01/16/2016 16:32   US Renal  01/17/2016  CLINICAL DATA:  80 year old male with  microscopic hematuria. Personal history of prostate cancer. Initial encounter. EXAM: RENAL / URINARY TRACT ULTRASOUND COMPLETE COMPARISON:  CT Abdomen and Pelvis 08/30/2015, and earlier. Right upper quadrant ultrasound  from today reported separately. FINDINGS: Right Kidney: Length: 12.3 cm. Echogenicity within normal limits. No mass or hydronephrosis visualized. Left Kidney: Length: 12.0 cm. Echogenicity within normal limits. No mass or hydronephrosis visualized. Bladder: 5 cm area which is dependent, complex, and mass like (image 15), however, there is no internal vascularity detected with brief color Doppler interrogation. Elsewhere the urinary bladder appears normal. Prostate measured to be 5.2 x 3.1 x 4.3 cm. IMPRESSION: 1. Abnormal 5 cm complex, mass-like area of dependent echogenicity in the urinary bladder. However, lack of internal vascularity on Doppler suggests this is more likely blood clot rather than a bladder tumor. Further evaluation recommended. 2. Normal for age sonographic appearance of both kidneys. Electronically Signed   By: Genevie Ann M.D.   On: 01/17/2016 08:01   US Renal  01/09/2016  CLINICAL DATA:  Two weeks of hematuria ; history of prostate enlargement EXAM: RENAL / URINARY TRACT ULTRASOUND COMPLETE COMPARISON:  Abdominal pelvic CT scan of July 30, 2015 FINDINGS: Right Kidney: Length: 11.5 cm. The renal cortical echotexture is equal to or slightly greater than that of the adjacent liver. There is no hydronephrosis nor discrete mass. Left Kidney: Length: 10.3 cm. The renal cortical echotexture is mildly increased similar to that on the right. There is no hydronephrosis. Bladder: There is an irregular mass in the posterior aspect of the urinary bladder wall measuring 2.5 x 2 x 4.4 cm. This appears separate from the more anteriorly and inferiorly positioned prostate gland. The prostate gland itself does produce a mild impression upon the urinary bladder base. IMPRESSION: 1. Posterior  bladder wall mass worrisome for malignancy. This is separate from the mildly enlarged prostate gland. 2. Increased renal cortical echotexture bilaterally consistent with medical renal disease. There is no hydronephrosis. Electronically Signed   By: David  Martinique M.D.   On: 01/09/2016 14:44   Ir Fluoro Guide Cv Line Left  01/25/2016  INDICATION: Bacteremia due to Staph aureus infection. History of chronic renal insufficiency. Request made for placement of a tunneled PICC line for antibiotic administration. EXAM: TUNNELED PICC LINE WITH ULTRASOUND AND FLUOROSCOPIC GUIDANCE MEDICATIONS: The patient is currently admitted to the hospital and receiving intravenous antibiotics. The antibiotic was given in an appropriate time interval prior to skin puncture. ANESTHESIA/SEDATION: Moderate (conscious) sedation was employed during this procedure. A total of Versed 0.5 mg and Fentanyl 25 mcg was administered intravenously. Moderate Sedation Time: 33 minutes. The patient's level of consciousness and vital signs were monitored continuously by radiology nursing throughout the procedure under my direct supervision. COMPLICATIONS: None immediate. PROCEDURE: Informed written consent was obtained from the patient after a discussion of the risks, benefits, and alternatives to treatment. Questions regarding the procedure were encouraged and answered. Given the presence of the right anterior chest wall pacemaker, the decision was made to place a left internal jugular approach dual lumen PICC line. As such, the left neck and chest were prepped with chlorhexidine in a sterile fashion, and a sterile drape was applied covering the operative field. Maximum barrier sterile technique with sterile gowns and gloves were used for the procedure. A timeout was performed prior to the initiation of the procedure. After creating a small venotomy incision, a micropuncture kit was utilized to access the left internal jugular vein under direct,  real-time ultrasound guidance after the overlying soft tissues were anesthetized with 1% lidocaine with epinephrine. Ultrasound image documentation was performed. Despite several attempts, there was difficulty advancing the micro wire centrally. As such, a limited central venogram was  performed via the inner 3 French sheath from the micropuncture kit demonstrating patency though marked tortuosity of the central aspect of the left internal jugular vein. Ultimately, a Nitrex wire was able to be advanced centrally. The microwire was kinked to measure appropriate catheter length. The micropuncture sheath was exchanged for a peel-away sheath over a guidewire. Initially, a 5 [place] dual lumen tunneled PICC measuring 28 cm was tunneled in a retrograde fashion from the anterior chest wall to the venotomy incision. Unfortunately, the initial catheter was noted to retract within the tortuous left innominate vein with with tip terminating at the level of the SVC. As such, the existing PICC line was cannulated with a GT Glidewire which was advanced to the level of the IVC. Under intermittent fluoroscopic guidance, the existing 28 cm tunneled PICC line was exchanged for a new slightly longer 31 cm dual lumen tunneled PICC line with tip ultimately terminating within the superior aspect the right atrium. Final catheter positioning was confirmed and documented with a spot radiographic image. The catheter aspirates and flushes normally. The catheter was flushed with appropriate volume heparin dwells. The catheter exit site was secured with a 0-Prolene retention suture. The venotomy incision was closed with Dermabond and Steri-strips. Dressings were applied. The patient tolerated the procedure well without immediate post procedural complication. FINDINGS: After catheter placement, the tip lies within the superior cavoatrial junction. The catheter aspirates and flushes normally and is ready for immediate use. IMPRESSION: Successful  placement of 31 cm dual lumen tunneled PICC catheter via the left internal jugular vein with tip terminating at the superior caval atrial junction. The catheter is ready for immediate use. Electronically Signed   By: Sandi Mariscal M.D.   On: 01/25/2016 13:51   Ir US Guide Vasc Access Left  01/25/2016  INDICATION: Bacteremia due to Staph aureus infection. History of chronic renal insufficiency. Request made for placement of a tunneled PICC line for antibiotic administration. EXAM: TUNNELED PICC LINE WITH ULTRASOUND AND FLUOROSCOPIC GUIDANCE MEDICATIONS: The patient is currently admitted to the hospital and receiving intravenous antibiotics. The antibiotic was given in an appropriate time interval prior to skin puncture. ANESTHESIA/SEDATION: Moderate (conscious) sedation was employed during this procedure. A total of Versed 0.5 mg and Fentanyl 25 mcg was administered intravenously. Moderate Sedation Time: 33 minutes. The patient's level of consciousness and vital signs were monitored continuously by radiology nursing throughout the procedure under my direct supervision. COMPLICATIONS: None immediate. PROCEDURE: Informed written consent was obtained from the patient after a discussion of the risks, benefits, and alternatives to treatment. Questions regarding the procedure were encouraged and answered. Given the presence of the right anterior chest wall pacemaker, the decision was made to place a left internal jugular approach dual lumen PICC line. As such, the left neck and chest were prepped with chlorhexidine in a sterile fashion, and a sterile drape was applied covering the operative field. Maximum barrier sterile technique with sterile gowns and gloves were used for the procedure. A timeout was performed prior to the initiation of the procedure. After creating a small venotomy incision, a micropuncture kit was utilized to access the left internal jugular vein under direct, real-time ultrasound guidance after the  overlying soft tissues were anesthetized with 1% lidocaine with epinephrine. Ultrasound image documentation was performed. Despite several attempts, there was difficulty advancing the micro wire centrally. As such, a limited central venogram was performed via the inner 3 French sheath from the micropuncture kit demonstrating patency though marked tortuosity of the central aspect  of the left internal jugular vein. Ultimately, a Nitrex wire was able to be advanced centrally. The microwire was kinked to measure appropriate catheter length. The micropuncture sheath was exchanged for a peel-away sheath over a guidewire. Initially, a 5 [place] dual lumen tunneled PICC measuring 28 cm was tunneled in a retrograde fashion from the anterior chest wall to the venotomy incision. Unfortunately, the initial catheter was noted to retract within the tortuous left innominate vein with with tip terminating at the level of the SVC. As such, the existing PICC line was cannulated with a GT Glidewire which was advanced to the level of the IVC. Under intermittent fluoroscopic guidance, the existing 28 cm tunneled PICC line was exchanged for a new slightly longer 31 cm dual lumen tunneled PICC line with tip ultimately terminating within the superior aspect the right atrium. Final catheter positioning was confirmed and documented with a spot radiographic image. The catheter aspirates and flushes normally. The catheter was flushed with appropriate volume heparin dwells. The catheter exit site was secured with a 0-Prolene retention suture. The venotomy incision was closed with Dermabond and Steri-strips. Dressings were applied. The patient tolerated the procedure well without immediate post procedural complication. FINDINGS: After catheter placement, the tip lies within the superior cavoatrial junction. The catheter aspirates and flushes normally and is ready for immediate use. IMPRESSION: Successful placement of 31 cm dual lumen tunneled  PICC catheter via the left internal jugular vein with tip terminating at the superior caval atrial junction. The catheter is ready for immediate use. Electronically Signed   By: Sandi Mariscal M.D.   On: 01/25/2016 13:51   Dg Fluoro Guided Needle Plc Aspiration/injection Loc  01/21/2016  CLINICAL DATA:  Left wrist pain and swelling.  Sepsis. EXAM: LEFT WRIST ASPIRATION UNDER FLUOROSCOPY FLUOROSCOPY TIME:  Fluoroscopy Time (in minutes and seconds): 24 SECONDS PROCEDURE: Overlying skin prepped with Betadine, draped in the usual sterile fashion, and infiltrated locally with buffered Lidocaine. 20 gauge needle advanced into the radiocarpal joint under direct fluoroscopic visualization. Approximately 3 cc of bloody fluid was aspirated from the joint and overlying soft tissues. IMPRESSION: Left radiocarpal joint aspiration performed without immediate complication. Mr. Tawil with tolerated the procedure well. Technically successful  hip injection under fluoroscopy. Electronically Signed   By: Lorriane Shire M.D.   On: 01/21/2016 08:10   US Abdomen Limited Ruq  01/17/2016  CLINICAL DATA:  80 year old male with abnormal LFTs. Prior cholecystectomy. Pneumonia. Initial encounter. EXAM: US ABDOMEN LIMITED - RIGHT UPPER QUADRANT COMPARISON:  Noncontrast chest CT 01/16/2016. CT Abdomen and Pelvis 08/30/2015 and earlier FINDINGS: Gallbladder: Surgically absent Common bile duct: Diameter: 6 mm, normal Liver: Chronic 2.6 cm left hepatic lobe cyst (image 18) does demonstrate mild wall thickening, however, this lesion is stable on multiple prior CTs back to 2011. Similar 3.1 cm right lobe cyst, also not significantly changed since 2011. Underlying hepatic echotexture is within normal limits. No intrahepatic biliary ductal dilatation is evident. No new liver lesion identified. Other findings: Negative visible right kidney. No free fluid identified. IMPRESSION: No acute liver or biliary findings. 2-3 cm liver cysts which are mildly  thick walled but benign, having not significantly changed since 2011. Electronically Signed   By: Genevie Ann M.D.   On: 01/17/2016 07:56    Anti-Infectives:   Azithromycin 01/16/16---> 01/17/16 Rocephin 01/16/16---> 01/17/16 Ancef 01/17/16---> Rifampin 01/17/16 --->  Subjective:   [name] D Coots Sr. continues to feel weak and reports ongoing left hand pain and a headache. Bowels moved yesterday.  No nausea or vomiting..  Objective:    Filed Vitals:   01/25/16 2017 01/25/16 2238 01/26/16 0628 01/26/16 0817  BP: 107/53 146/57 139/53 144/57  Pulse: 88  71 69  Temp: 98.9 F (37.2 C)  98.6 F (37 C) 98.7 F (37.1 C)  TempSrc: Oral  Oral Oral  Resp: '18  20 12  '$ Height:      Weight:   72.394 kg (159 lb 9.6 oz)   SpO2: 99%  98% 100%    Intake/Output Summary (Last 24 hours) at 01/26/16 0824 Last data filed at 01/26/16 8832  Gross per 24 hour  Intake      0 ml  Output    351 ml  Net   -351 ml   Filed Weights   01/24/16 0416 01/25/16 0534 01/26/16 0628  Weight: 77.52 kg (170 lb 14.4 oz) 76.25 kg (168 lb 1.6 oz) 72.394 kg (159 lb 9.6 oz)    Exam: General exam: Appears Mildly lethargic. Respiratory system: Clear to auscultation. Respiratory effort normal. Cardiovascular system: S1 & S2 heard, RRR. No JVD,  rubs, gallops or clicks. Grade 2/6 SEM. Gastrointestinal system: Abdomen is nondistended, soft and nontender. No organomegaly or masses felt. Normal bowel sounds heard. Central nervous system: Mildly lethargic. No focal neurological deficits. Extremities: Left hand markedly erythematous/swollen and tender with limited range of motion of fingers. Skin: No rashes, lesions or ulcers Psychiatry: Judgement and insight appear normal. Mood & affect appropriate.   Data Reviewed:   I have personally reviewed following labs and imaging studies:  Labs: Basic Metabolic Panel:  Recent Labs Lab 01/22/16 0523 01/23/16 0239 01/24/16 0317 01/25/16 0311 01/26/16 0513  NA 131* 128* 131* 130*  131*  K 3.9 3.7 4.2 4.3 4.5  CL 101 98* 102 101 102  CO2 20* 20* 20* 20* 20*  GLUCOSE 94 127* 192* 154* 197*  BUN 82* 83* 100* 114* 123*  CREATININE 3.51* 3.69* 4.20* 5.12* 5.71*  CALCIUM 8.8* 8.5* 8.7* 8.6* 8.2*  PHOS  --   --   --   --  4.1   GFR Estimated Creatinine Clearance: 10.9 mL/min (by C-G formula based on Cr of 5.71).  CBC:  Recent Labs Lab 01/23/16 0239 01/24/16 0317 01/25/16 0311 01/26/16 0513  WBC 9.6 10.0 8.6 8.6  HGB 7.9* 8.1* 7.3* 7.0*  HCT 22.9* 23.1* 21.3* 20.5*  MCV 92.3 91.7 92.6 93.2  PLT 169 212 238 251   CBG:  Recent Labs Lab 01/25/16 0647 01/25/16 1406 01/25/16 1740 01/25/16 2241 01/26/16 0701  GLUCAP 148* 160* 280* 244* 180*   Microbiology Recent Results (from the past 240 hour(s))  Blood culture (routine x 2)     Status: Abnormal   Collection Time: 01/16/16  1:00 PM  Result Value Ref Range Status   Specimen Description LEFT ANTECUBITAL  Final   Special Requests BOTTLES DRAWN AEROBIC AND ANAEROBIC 10CC  Final   Culture  Setup Time   Final    GRAM POSITIVE COCCI IN CLUSTERS IN BOTH AEROBIC AND ANAEROBIC BOTTLES CRITICAL RESULT CALLED TO, READ BACK BY AND VERIFIED WITH: J MARKLE 01/17/16 @ 0927 M VESTAL    Culture (A)  Final    STAPHYLOCOCCUS AUREUS SUSCEPTIBILITIES PERFORMED ON PREVIOUS CULTURE WITHIN THE LAST 5 DAYS.    Report Status 01/19/2016 FINAL  Final  Blood culture (routine x 2)     Status: Abnormal   Collection Time: 01/16/16  1:06 PM  Result Value Ref Range Status   Specimen Description BLOOD LEFT HAND  Final   Special Requests AEROBIC BOTTLE ONLY 10CC  Final   Culture  Setup Time   Final    GRAM POSITIVE COCCI IN CLUSTERS AEROBIC BOTTLE ONLY CRITICAL RESULT CALLED TO, READ BACK BY AND VERIFIED WITH: J MARKLE 01/17/16 @ 0927 M VESTAL    Culture STAPHYLOCOCCUS AUREUS (A)  Final   Report Status 01/19/2016 FINAL  Final   Organism ID, Bacteria STAPHYLOCOCCUS AUREUS  Final      Susceptibility   Staphylococcus aureus - MIC*     CIPROFLOXACIN >=8 RESISTANT Resistant     ERYTHROMYCIN <=0.25 SENSITIVE Sensitive     GENTAMICIN <=0.5 SENSITIVE Sensitive     OXACILLIN 0.5 SENSITIVE Sensitive     TETRACYCLINE <=1 SENSITIVE Sensitive     VANCOMYCIN <=0.5 SENSITIVE Sensitive     TRIMETH/SULFA <=10 SENSITIVE Sensitive     CLINDAMYCIN <=0.25 SENSITIVE Sensitive     RIFAMPIN <=0.5 SENSITIVE Sensitive     Inducible Clindamycin NEGATIVE Sensitive     * STAPHYLOCOCCUS AUREUS  Blood Culture ID Panel (Reflexed)     Status: Abnormal   Collection Time: 01/16/16  1:06 PM  Result Value Ref Range Status   Enterococcus species NOT DETECTED NOT DETECTED Final   Vancomycin resistance NOT DETECTED NOT DETECTED Final   Listeria monocytogenes NOT DETECTED NOT DETECTED Final   Staphylococcus species DETECTED (A) NOT DETECTED Final    Comment: CRITICAL RESULT CALLED TO, READ BACK BY AND VERIFIED WITH: J MARKLE 01/17/16 @ 0927 M VESTAL    Staphylococcus aureus DETECTED (A) NOT DETECTED Final    Comment: CRITICAL RESULT CALLED TO, READ BACK BY AND VERIFIED WITH: J MARKLE 01/17/16 @ 0927 M VESTAL    Methicillin resistance NOT DETECTED NOT DETECTED Final   Streptococcus species NOT DETECTED NOT DETECTED Final   Streptococcus agalactiae NOT DETECTED NOT DETECTED Final   Streptococcus pneumoniae NOT DETECTED NOT DETECTED Final   Streptococcus pyogenes NOT DETECTED NOT DETECTED Final   Acinetobacter baumannii NOT DETECTED NOT DETECTED Final   Enterobacteriaceae species NOT DETECTED NOT DETECTED Final   Enterobacter cloacae complex NOT DETECTED NOT DETECTED Final   Escherichia coli NOT DETECTED NOT DETECTED Final   Klebsiella oxytoca NOT DETECTED NOT DETECTED Final   Klebsiella pneumoniae NOT DETECTED NOT DETECTED Final   Proteus species NOT DETECTED NOT DETECTED Final   Serratia marcescens NOT DETECTED NOT DETECTED Final   Carbapenem resistance NOT DETECTED NOT DETECTED Final   Haemophilus influenzae NOT DETECTED NOT DETECTED Final    Neisseria meningitidis NOT DETECTED NOT DETECTED Final   Pseudomonas aeruginosa NOT DETECTED NOT DETECTED Final   Candida albicans NOT DETECTED NOT DETECTED Final   Candida glabrata NOT DETECTED NOT DETECTED Final   Candida krusei NOT DETECTED NOT DETECTED Final   Candida parapsilosis NOT DETECTED NOT DETECTED Final   Candida tropicalis NOT DETECTED NOT DETECTED Final  Culture, sputum-assessment     Status: None   Collection Time: 01/16/16 10:24 PM  Result Value Ref Range Status   Specimen Description EXPECTORATED SPUTUM  Final   Special Requests NONE  Final   Sputum evaluation   Final    THIS SPECIMEN IS ACCEPTABLE. RESPIRATORY CULTURE REPORT TO FOLLOW.   Report Status 01/17/2016 FINAL  Final  Culture, respiratory (NON-Expectorated)     Status: None   Collection Time: 01/16/16 10:24 PM  Result Value Ref Range Status   Specimen Description EXPECTORATED SPUTUM  Final   Special Requests NONE  Final   Gram Stain   Final  RARE SQUAMOUS EPITHELIAL CELLS PRESENT ABUNDANT WBC PRESENT,BOTH PMN AND MONONUCLEAR FEW GRAM POSITIVE COCCI IN PAIRS IN CLUSTERS    Culture Consistent with normal respiratory flora.  Final   Report Status 01/19/2016 FINAL  Final  Culture, blood (routine x 2)     Status: None   Collection Time: 01/17/16  9:50 AM  Result Value Ref Range Status   Specimen Description BLOOD BLOOD LEFT ARM  Final   Special Requests BOTTLES DRAWN AEROBIC ONLY 5CC  Final   Culture NO GROWTH 5 DAYS  Final   Report Status 01/22/2016 FINAL  Final  Culture, blood (routine x 2)     Status: None   Collection Time: 01/17/16  9:50 AM  Result Value Ref Range Status   Specimen Description BLOOD LEFT ANTECUBITAL  Final   Special Requests BOTTLES DRAWN AEROBIC ONLY 5CC  Final   Culture NO GROWTH 5 DAYS  Final   Report Status 01/22/2016 FINAL  Final  Acid Fast Smear (AFB)     Status: None   Collection Time: 01/17/16 11:26 AM  Result Value Ref Range Status   AFB Specimen Processing  Concentration  Final   Acid Fast Smear Negative  Final    Comment: (NOTE) Performed At: [facility] [address] [place], [place] [id number] Mila Homer MD WU:[id number]    Source (AFB) SPUTUM  Final  Body fluid culture     Status: None   Collection Time: 01/19/16  4:37 PM  Result Value Ref Range Status   Specimen Description FLUID SYNOVIAL WRIST LEFT  Final   Special Requests NONE  Final   Gram Stain   Final    ABUNDANT WBC PRESENT,BOTH PMN AND MONONUCLEAR NO ORGANISMS SEEN    Culture NO GROWTH 3 DAYS  Final   Report Status 01/22/2016 FINAL  Final  Aerobic/Anaerobic Culture (surgical/deep wound)     Status: None (Preliminary result)   Collection Time: 01/21/16  6:52 PM  Result Value Ref Range Status   Specimen Description WOUND LEFT WRIST  Final   Special Requests NONE  Final   Gram Stain   Final    WBC PRESENT,BOTH PMN AND MONONUCLEAR NO ORGANISMS SEEN    Culture   Final    NO GROWTH 2 DAYS NO ANAEROBES ISOLATED; CULTURE IN PROGRESS FOR 5 DAYS   Report Status PENDING  Incomplete    Radiology: Ir Fluoro Guide Cv Line Left  01/25/2016  INDICATION: Bacteremia due to Staph aureus infection. History of chronic renal insufficiency. Request made for placement of a tunneled PICC line for antibiotic administration. EXAM: TUNNELED PICC LINE WITH ULTRASOUND AND FLUOROSCOPIC GUIDANCE MEDICATIONS: The patient is currently admitted to the hospital and receiving intravenous antibiotics. The antibiotic was given in an appropriate time interval prior to skin puncture. ANESTHESIA/SEDATION: Moderate (conscious) sedation was employed during this procedure. A total of Versed 0.5 mg and Fentanyl 25 mcg was administered intravenously. Moderate Sedation Time: 33 minutes. The patient's level of consciousness and vital signs were monitored continuously by radiology nursing throughout the procedure under my direct supervision. COMPLICATIONS: None immediate. PROCEDURE: Informed written  consent was obtained from the patient after a discussion of the risks, benefits, and alternatives to treatment. Questions regarding the procedure were encouraged and answered. Given the presence of the right anterior chest wall pacemaker, the decision was made to place a left internal jugular approach dual lumen PICC line. As such, the left neck and chest were prepped with chlorhexidine in a sterile fashion, and a sterile drape  was applied covering the operative field. Maximum barrier sterile technique with sterile gowns and gloves were used for the procedure. A timeout was performed prior to the initiation of the procedure. After creating a small venotomy incision, a micropuncture kit was utilized to access the left internal jugular vein under direct, real-time ultrasound guidance after the overlying soft tissues were anesthetized with 1% lidocaine with epinephrine. Ultrasound image documentation was performed. Despite several attempts, there was difficulty advancing the micro wire centrally. As such, a limited central venogram was performed via the inner 3 French sheath from the micropuncture kit demonstrating patency though marked tortuosity of the central aspect of the left internal jugular vein. Ultimately, a Nitrex wire was able to be advanced centrally. The microwire was kinked to measure appropriate catheter length. The micropuncture sheath was exchanged for a peel-away sheath over a guidewire. Initially, a 5 [place] dual lumen tunneled PICC measuring 28 cm was tunneled in a retrograde fashion from the anterior chest wall to the venotomy incision. Unfortunately, the initial catheter was noted to retract within the tortuous left innominate vein with with tip terminating at the level of the SVC. As such, the existing PICC line was cannulated with a GT Glidewire which was advanced to the level of the IVC. Under intermittent fluoroscopic guidance, the existing 28 cm tunneled PICC line was exchanged for a new  slightly longer 31 cm dual lumen tunneled PICC line with tip ultimately terminating within the superior aspect the right atrium. Final catheter positioning was confirmed and documented with a spot radiographic image. The catheter aspirates and flushes normally. The catheter was flushed with appropriate volume heparin dwells. The catheter exit site was secured with a 0-Prolene retention suture. The venotomy incision was closed with Dermabond and Steri-strips. Dressings were applied. The patient tolerated the procedure well without immediate post procedural complication. FINDINGS: After catheter placement, the tip lies within the superior cavoatrial junction. The catheter aspirates and flushes normally and is ready for immediate use. IMPRESSION: Successful placement of 31 cm dual lumen tunneled PICC catheter via the left internal jugular vein with tip terminating at the superior caval atrial junction. The catheter is ready for immediate use. Electronically Signed   By: Sandi Mariscal M.D.   On: 01/25/2016 13:51   Ir US Guide Vasc Access Left  01/25/2016  INDICATION: Bacteremia due to Staph aureus infection. History of chronic renal insufficiency. Request made for placement of a tunneled PICC line for antibiotic administration. EXAM: TUNNELED PICC LINE WITH ULTRASOUND AND FLUOROSCOPIC GUIDANCE MEDICATIONS: The patient is currently admitted to the hospital and receiving intravenous antibiotics. The antibiotic was given in an appropriate time interval prior to skin puncture. ANESTHESIA/SEDATION: Moderate (conscious) sedation was employed during this procedure. A total of Versed 0.5 mg and Fentanyl 25 mcg was administered intravenously. Moderate Sedation Time: 33 minutes. The patient's level of consciousness and vital signs were monitored continuously by radiology nursing throughout the procedure under my direct supervision. COMPLICATIONS: None immediate. PROCEDURE: Informed written consent was obtained from the patient  after a discussion of the risks, benefits, and alternatives to treatment. Questions regarding the procedure were encouraged and answered. Given the presence of the right anterior chest wall pacemaker, the decision was made to place a left internal jugular approach dual lumen PICC line. As such, the left neck and chest were prepped with chlorhexidine in a sterile fashion, and a sterile drape was applied covering the operative field. Maximum barrier sterile technique with sterile gowns and gloves were used for the procedure.  A timeout was performed prior to the initiation of the procedure. After creating a small venotomy incision, a micropuncture kit was utilized to access the left internal jugular vein under direct, real-time ultrasound guidance after the overlying soft tissues were anesthetized with 1% lidocaine with epinephrine. Ultrasound image documentation was performed. Despite several attempts, there was difficulty advancing the micro wire centrally. As such, a limited central venogram was performed via the inner 3 French sheath from the micropuncture kit demonstrating patency though marked tortuosity of the central aspect of the left internal jugular vein. Ultimately, a Nitrex wire was able to be advanced centrally. The microwire was kinked to measure appropriate catheter length. The micropuncture sheath was exchanged for a peel-away sheath over a guidewire. Initially, a 5 [place] dual lumen tunneled PICC measuring 28 cm was tunneled in a retrograde fashion from the anterior chest wall to the venotomy incision. Unfortunately, the initial catheter was noted to retract within the tortuous left innominate vein with with tip terminating at the level of the SVC. As such, the existing PICC line was cannulated with a GT Glidewire which was advanced to the level of the IVC. Under intermittent fluoroscopic guidance, the existing 28 cm tunneled PICC line was exchanged for a new slightly longer 31 cm dual lumen tunneled  PICC line with tip ultimately terminating within the superior aspect the right atrium. Final catheter positioning was confirmed and documented with a spot radiographic image. The catheter aspirates and flushes normally. The catheter was flushed with appropriate volume heparin dwells. The catheter exit site was secured with a 0-Prolene retention suture. The venotomy incision was closed with Dermabond and Steri-strips. Dressings were applied. The patient tolerated the procedure well without immediate post procedural complication. FINDINGS: After catheter placement, the tip lies within the superior cavoatrial junction. The catheter aspirates and flushes normally and is ready for immediate use. IMPRESSION: Successful placement of 31 cm dual lumen tunneled PICC catheter via the left internal jugular vein with tip terminating at the superior caval atrial junction. The catheter is ready for immediate use. Electronically Signed   By: Sandi Mariscal M.D.   On: 01/25/2016 13:51    Medications:   . [MAR Hold] amiodarone  100 mg Oral Daily  . [MAR Hold] atorvastatin  80 mg Oral QHS  . [MAR Hold] Calcifediol ER  30 mcg Oral QHS  . [MAR Hold] carvedilol  25 mg Oral BID WC  . [MAR Hold]  ceFAZolin (ANCEF) IV  1 g Intravenous Q24H  . [MAR Hold] cloNIDine  0.1 mg Oral BID  . [MAR Hold] hydrALAZINE  50 mg Oral TID  . [MAR Hold] insulin aspart  0-5 Units Subcutaneous QHS  . [MAR Hold] insulin aspart  0-9 Units Subcutaneous TID WC  . [MAR Hold] insulin aspart  3 Units Subcutaneous TID WC  . [MAR Hold] isosorbide mononitrate  120 mg Oral Daily  . [MAR Hold] multivitamin with minerals  1 tablet Oral q morning - 10a  . [MAR Hold] predniSONE  50 mg Oral Q breakfast  . [MAR Hold] rifampin  600 mg Oral Daily  . [MAR Hold] tamsulosin  0.4 mg Oral QPC supper   Continuous Infusions: . sodium chloride 50 mL/hr at 01/25/16 1558    Time spent: 35 minutes.  The patient is medically complex with multiple co-morbidities and is  at high risk for clinical deterioration and requires high complexity decision making and coordination of care with multiple specialists.    LOS: 10 days   Neira Bentsen  [facility] Pager [phone]. If unable to reach me by pager, please call my cell phone at [phone].  *Please refer to [email or web address], password [email or web address] to get updated schedule on who will round on this patient, as hospitalists switch teams weekly. If 7PM-7AM, please contact night-coverage at [email or web address], password [email or web address] for any overnight needs.  01/26/2016, 8:24 AM

## 2016-01-26 NOTE — Progress Notes (Signed)
Patient ID: [name] Brow., male   DOB: December 10, 1935, 80 y.o.   MRN: [id number]    Patient Name: [name] Sr. Date of Encounter: 01/26/2016     Principal Problem:   Staphylococcus aureus bacteremia Active Problems:   PAF (paroxysmal atrial fibrillation) (HCC)   Chronic diastolic heart failure (HCC)   Diabetes mellitus (HCC)   Essential hypertension   CKD (chronic kidney disease), stage IV (HCC)   Anemia of chronic disease   MDS (myelodysplastic syndrome) (HCC)   CAD of autologous bypass graft   Pacemaker   Hematuria   CAP (community acquired pneumonia)   Hemoptysis   Pain   Wrist swelling   Pseudogout involving multiple joints   Bladder mass    SUBJECTIVE  No specific complaints.   CURRENT MEDS . amiodarone  100 mg Oral Daily  . atorvastatin  80 mg Oral QHS  . Calcifediol ER  30 mcg Oral QHS  . carvedilol  25 mg Oral BID WC  .  ceFAZolin (ANCEF) IV  1 g Intravenous Q24H  . cloNIDine  0.1 mg Oral BID  . hydrALAZINE  50 mg Oral TID  . insulin aspart  0-5 Units Subcutaneous QHS  . insulin aspart  0-9 Units Subcutaneous TID WC  . insulin aspart  3 Units Subcutaneous TID WC  . isosorbide mononitrate  120 mg Oral Daily  . multivitamin with minerals  1 tablet Oral q morning - 10a  . predniSONE  50 mg Oral Q breakfast  . rifampin  600 mg Oral Daily  . tamsulosin  0.4 mg Oral QPC supper    OBJECTIVE  Filed Vitals:   01/25/16 1901 01/25/16 2017 01/25/16 2238 01/26/16 0628  BP: 137/76 107/53 146/57 139/53  Pulse:  88  71  Temp:  98.9 F (37.2 C)  98.6 F (37 C)  TempSrc:  Oral  Oral  Resp:  18  20  Height:      Weight:    159 lb 9.6 oz (72.394 kg)  SpO2:  99%  98%    Intake/Output Summary (Last 24 hours) at 01/26/16 0750 Last data filed at 01/26/16 2979  Gross per 24 hour  Intake      0 ml  Output    351 ml  Net   -351 ml   Filed Weights   01/24/16 0416 01/25/16 0534 01/26/16 0628  Weight: 170 lb 14.4 oz (77.52 kg) 168 lb 1.6 oz (76.25 kg)  159 lb 9.6 oz (72.394 kg)    PHYSICAL EXAM  General: Pleasant, chronically ill appearing, NAD. Neuro: Alert and oriented X 3. Moves all extremities spontaneously. Psych: blunted affect. HEENT:  Normal  Neck: Supple without bruits or JVD. Lungs:  Resp regular and unlabored, CTA. Heart: RRR no s3, s4, or murmurs. Abdomen: Soft, non-tender, non-distended, BS + x 4.  Extremities: No clubbing, cyanosis or edema. DP/PT/Radials 2+ and equal bilaterally.  Accessory Clinical Findings  CBC  Recent Labs  01/25/16 0311 01/26/16 0513  WBC 8.6 8.6  HGB 7.3* 7.0*  HCT 21.3* 20.5*  MCV 92.6 93.2  PLT 238 892   Basic Metabolic Panel  Recent Labs  01/25/16 0311 01/26/16 0513  NA 130* 131*  K 4.3 4.5  CL 101 102  CO2 20* 20*  GLUCOSE 154* 197*  BUN 114* 123*  CREATININE 5.12* 5.71*  CALCIUM 8.6* 8.2*  PHOS  --  4.1   Liver Function Tests  Recent Labs  01/26/16 0513  ALBUMIN 2.0*   No results  for input(s): LIPASE, AMYLASE in the last 72 hours. Cardiac Enzymes  Recent Labs  01/25/16 1519  CKTOTAL 60   BNP Invalid input(s): POCBNP D-Dimer No results for input(s): DDIMER in the last 72 hours. Hemoglobin A1C No results for input(s): HGBA1C in the last 72 hours. Fasting Lipid Panel No results for input(s): CHOL, HDL, LDLCALC, TRIG, CHOLHDL, LDLDIRECT in the last 72 hours. Thyroid Function Tests No results for input(s): TSH, T4TOTAL, T3FREE, THYROIDAB in the last 72 hours.  Invalid input(s): FREET3  TELE  nsr  Radiology/Studies  Dg Chest 2 View  01/16/2016  CLINICAL DATA:  Chest pain, cough for 2 days EXAM: CHEST  2 VIEW COMPARISON:  CT chest 04/03/2014 FINDINGS: Cardiomediastinal silhouette is unremarkable. Status post median sternotomy. Dual lead cardiac pacemaker with leads in right atrium and right ventricle. There is streaky airspace opacification in right base infrahilar region and right upper lobe peripheral perihilar. Findings highly suspicious for  pneumonia. Probable small right pleural effusion. Left lung is clear. IMPRESSION: There is streaky airspace opacification in right base infrahilar region and right upper lobe peripheral perihilar. Findings highly suspicious for pneumonia. Followup PA and lateral chest X-ray is recommended in 3-4 weeks following trial of antibiotic therapy to ensure resolution and exclude underlying malignancy. Probable small right pleural effusion. Electronically Signed   By: Lahoma Crocker M.D.   On: 01/16/2016 13:58   Dg Wrist 2 Views Left  01/18/2016  CLINICAL DATA:  Acute onset of left wrist pain.  Initial encounter. EXAM: LEFT WRIST - 2 VIEW COMPARISON:  None. FINDINGS: There is no evidence of fracture or dislocation. There is joint space narrowing along the radiocarpal joint, and at the radial aspect of the carpal rows. This resolves in mild chronic deformity of the scaphoid and lunate, with expansion of the scapholunate distance to 5 mm. Scattered vascular calcifications are seen. No additional soft tissue abnormalities are characterized on radiograph. IMPRESSION: 1. No evidence of fracture or dislocation. 2. Degenerative joint space narrowing along the radiocarpal joints, and at the radial aspect of the carpal rows. This results in mild chronic deformity of the scaphoid and lunate, with expansion of the scapholunate distance to 5 mm, reflecting scapholunate dissociation. Electronically Signed   By: Garald Balding M.D.   On: 01/18/2016 16:57   Ct Chest Wo Contrast  01/16/2016  CLINICAL DATA:  Hemoptysis, cough.  Symptoms since Monday. EXAM: CT CHEST WITHOUT CONTRAST TECHNIQUE: TECHNIQUE Multidetector CT imaging of the chest was performed following the standard protocol without IV contrast. COMPARISON:  Chest radiograph 01/16/2016, CT 04/03/2014 FINDINGS: Cardiovascular: Calcification of the thoracic aorta. Coronary bypass graft. Pacer wires in the RIGHT heart. No pericardial fluid. Mediastinum/Nodes: No axillary or  supraclavicular adenopathy. No mediastinal hilar adenopathy. Esophagus normal. Lungs/Pleura: There is consolidative airspace disease in the RIGHT lower lobe with air bronchograms. A similar pattern in the posterior aspect the RIGHT upper lobe. Smaller pattern of consolidation in the LEFT lower lobe. Findings are consistent with multifocal pneumonia. Upper Abdomen: Limited view of the liver, kidneys, pancreas are unremarkable. Normal adrenal glands. Musculoskeletal: No aggressive osseous lesion.  Midline sternotomy. IMPRESSION: 1. Multifocal pneumonia with most dense infection in RIGHT lower lobe. 2. Coronary artery calcification and aortic atherosclerotic calcification. Electronically Signed   By: Suzy Bouchard M.D.   On: 01/16/2016 16:32   US Renal  01/17/2016  CLINICAL DATA:  80 year old male with microscopic hematuria. Personal history of prostate cancer. Initial encounter. EXAM: RENAL / URINARY TRACT ULTRASOUND COMPLETE COMPARISON:  CT Abdomen and  Pelvis 08/30/2015, and earlier. Right upper quadrant ultrasound from today reported separately. FINDINGS: Right Kidney: Length: 12.3 cm. Echogenicity within normal limits. No mass or hydronephrosis visualized. Left Kidney: Length: 12.0 cm. Echogenicity within normal limits. No mass or hydronephrosis visualized. Bladder: 5 cm area which is dependent, complex, and mass like (image 15), however, there is no internal vascularity detected with brief color Doppler interrogation. Elsewhere the urinary bladder appears normal. Prostate measured to be 5.2 x 3.1 x 4.3 cm. IMPRESSION: 1. Abnormal 5 cm complex, mass-like area of dependent echogenicity in the urinary bladder. However, lack of internal vascularity on Doppler suggests this is more likely blood clot rather than a bladder tumor. Further evaluation recommended. 2. Normal for age sonographic appearance of both kidneys. Electronically Signed   By: Genevie Ann M.D.   On: 01/17/2016 08:01   US Renal  01/09/2016  CLINICAL  DATA:  Two weeks of hematuria ; history of prostate enlargement EXAM: RENAL / URINARY TRACT ULTRASOUND COMPLETE COMPARISON:  Abdominal pelvic CT scan of July 30, 2015 FINDINGS: Right Kidney: Length: 11.5 cm. The renal cortical echotexture is equal to or slightly greater than that of the adjacent liver. There is no hydronephrosis nor discrete mass. Left Kidney: Length: 10.3 cm. The renal cortical echotexture is mildly increased similar to that on the right. There is no hydronephrosis. Bladder: There is an irregular mass in the posterior aspect of the urinary bladder wall measuring 2.5 x 2 x 4.4 cm. This appears separate from the more anteriorly and inferiorly positioned prostate gland. The prostate gland itself does produce a mild impression upon the urinary bladder base. IMPRESSION: 1. Posterior bladder wall mass worrisome for malignancy. This is separate from the mildly enlarged prostate gland. 2. Increased renal cortical echotexture bilaterally consistent with medical renal disease. There is no hydronephrosis. Electronically Signed   By: David  Martinique M.D.   On: 01/09/2016 14:44   Ir Fluoro Guide Cv Line Left  01/25/2016  INDICATION: Bacteremia due to Staph aureus infection. History of chronic renal insufficiency. Request made for placement of a tunneled PICC line for antibiotic administration. EXAM: TUNNELED PICC LINE WITH ULTRASOUND AND FLUOROSCOPIC GUIDANCE MEDICATIONS: The patient is currently admitted to the hospital and receiving intravenous antibiotics. The antibiotic was given in an appropriate time interval prior to skin puncture. ANESTHESIA/SEDATION: Moderate (conscious) sedation was employed during this procedure. A total of Versed 0.5 mg and Fentanyl 25 mcg was administered intravenously. Moderate Sedation Time: 33 minutes. The patient's level of consciousness and vital signs were monitored continuously by radiology nursing throughout the procedure under my direct supervision. COMPLICATIONS: None  immediate. PROCEDURE: Informed written consent was obtained from the patient after a discussion of the risks, benefits, and alternatives to treatment. Questions regarding the procedure were encouraged and answered. Given the presence of the right anterior chest wall pacemaker, the decision was made to place a left internal jugular approach dual lumen PICC line. As such, the left neck and chest were prepped with chlorhexidine in a sterile fashion, and a sterile drape was applied covering the operative field. Maximum barrier sterile technique with sterile gowns and gloves were used for the procedure. A timeout was performed prior to the initiation of the procedure. After creating a small venotomy incision, a micropuncture kit was utilized to access the left internal jugular vein under direct, real-time ultrasound guidance after the overlying soft tissues were anesthetized with 1% lidocaine with epinephrine. Ultrasound image documentation was performed. Despite several attempts, there was difficulty advancing the micro wire  centrally. As such, a limited central venogram was performed via the inner 3 French sheath from the micropuncture kit demonstrating patency though marked tortuosity of the central aspect of the left internal jugular vein. Ultimately, a Nitrex wire was able to be advanced centrally. The microwire was kinked to measure appropriate catheter length. The micropuncture sheath was exchanged for a peel-away sheath over a guidewire. Initially, a 5 [place] dual lumen tunneled PICC measuring 28 cm was tunneled in a retrograde fashion from the anterior chest wall to the venotomy incision. Unfortunately, the initial catheter was noted to retract within the tortuous left innominate vein with with tip terminating at the level of the SVC. As such, the existing PICC line was cannulated with a GT Glidewire which was advanced to the level of the IVC. Under intermittent fluoroscopic guidance, the existing 28 cm tunneled  PICC line was exchanged for a new slightly longer 31 cm dual lumen tunneled PICC line with tip ultimately terminating within the superior aspect the right atrium. Final catheter positioning was confirmed and documented with a spot radiographic image. The catheter aspirates and flushes normally. The catheter was flushed with appropriate volume heparin dwells. The catheter exit site was secured with a 0-Prolene retention suture. The venotomy incision was closed with Dermabond and Steri-strips. Dressings were applied. The patient tolerated the procedure well without immediate post procedural complication. FINDINGS: After catheter placement, the tip lies within the superior cavoatrial junction. The catheter aspirates and flushes normally and is ready for immediate use. IMPRESSION: Successful placement of 31 cm dual lumen tunneled PICC catheter via the left internal jugular vein with tip terminating at the superior caval atrial junction. The catheter is ready for immediate use. Electronically Signed   By: Sandi Mariscal M.D.   On: 01/25/2016 13:51   Ir US Guide Vasc Access Left  01/25/2016  INDICATION: Bacteremia due to Staph aureus infection. History of chronic renal insufficiency. Request made for placement of a tunneled PICC line for antibiotic administration. EXAM: TUNNELED PICC LINE WITH ULTRASOUND AND FLUOROSCOPIC GUIDANCE MEDICATIONS: The patient is currently admitted to the hospital and receiving intravenous antibiotics. The antibiotic was given in an appropriate time interval prior to skin puncture. ANESTHESIA/SEDATION: Moderate (conscious) sedation was employed during this procedure. A total of Versed 0.5 mg and Fentanyl 25 mcg was administered intravenously. Moderate Sedation Time: 33 minutes. The patient's level of consciousness and vital signs were monitored continuously by radiology nursing throughout the procedure under my direct supervision. COMPLICATIONS: None immediate. PROCEDURE: Informed written  consent was obtained from the patient after a discussion of the risks, benefits, and alternatives to treatment. Questions regarding the procedure were encouraged and answered. Given the presence of the right anterior chest wall pacemaker, the decision was made to place a left internal jugular approach dual lumen PICC line. As such, the left neck and chest were prepped with chlorhexidine in a sterile fashion, and a sterile drape was applied covering the operative field. Maximum barrier sterile technique with sterile gowns and gloves were used for the procedure. A timeout was performed prior to the initiation of the procedure. After creating a small venotomy incision, a micropuncture kit was utilized to access the left internal jugular vein under direct, real-time ultrasound guidance after the overlying soft tissues were anesthetized with 1% lidocaine with epinephrine. Ultrasound image documentation was performed. Despite several attempts, there was difficulty advancing the micro wire centrally. As such, a limited central venogram was performed via the inner 3 French sheath from the micropuncture kit demonstrating  patency though marked tortuosity of the central aspect of the left internal jugular vein. Ultimately, a Nitrex wire was able to be advanced centrally. The microwire was kinked to measure appropriate catheter length. The micropuncture sheath was exchanged for a peel-away sheath over a guidewire. Initially, a 5 [place] dual lumen tunneled PICC measuring 28 cm was tunneled in a retrograde fashion from the anterior chest wall to the venotomy incision. Unfortunately, the initial catheter was noted to retract within the tortuous left innominate vein with with tip terminating at the level of the SVC. As such, the existing PICC line was cannulated with a GT Glidewire which was advanced to the level of the IVC. Under intermittent fluoroscopic guidance, the existing 28 cm tunneled PICC line was exchanged for a new  slightly longer 31 cm dual lumen tunneled PICC line with tip ultimately terminating within the superior aspect the right atrium. Final catheter positioning was confirmed and documented with a spot radiographic image. The catheter aspirates and flushes normally. The catheter was flushed with appropriate volume heparin dwells. The catheter exit site was secured with a 0-Prolene retention suture. The venotomy incision was closed with Dermabond and Steri-strips. Dressings were applied. The patient tolerated the procedure well without immediate post procedural complication. FINDINGS: After catheter placement, the tip lies within the superior cavoatrial junction. The catheter aspirates and flushes normally and is ready for immediate use. IMPRESSION: Successful placement of 31 cm dual lumen tunneled PICC catheter via the left internal jugular vein with tip terminating at the superior caval atrial junction. The catheter is ready for immediate use. Electronically Signed   By: Sandi Mariscal M.D.   On: 01/25/2016 13:51   Dg Fluoro Guided Needle Plc Aspiration/injection Loc  01/21/2016  CLINICAL DATA:  Left wrist pain and swelling.  Sepsis. EXAM: LEFT WRIST ASPIRATION UNDER FLUOROSCOPY FLUOROSCOPY TIME:  Fluoroscopy Time (in minutes and seconds): 24 SECONDS PROCEDURE: Overlying skin prepped with Betadine, draped in the usual sterile fashion, and infiltrated locally with buffered Lidocaine. 20 gauge needle advanced into the radiocarpal joint under direct fluoroscopic visualization. Approximately 3 cc of bloody fluid was aspirated from the joint and overlying soft tissues. IMPRESSION: Left radiocarpal joint aspiration performed without immediate complication. Mr. Rieth with tolerated the procedure well. Technically successful  hip injection under fluoroscopy. Electronically Signed   By: Lorriane Shire M.D.   On: 01/21/2016 08:10   US Abdomen Limited Ruq  01/17/2016  CLINICAL DATA:  80 year old male with abnormal LFTs. Prior  cholecystectomy. Pneumonia. Initial encounter. EXAM: US ABDOMEN LIMITED - RIGHT UPPER QUADRANT COMPARISON:  Noncontrast chest CT 01/16/2016. CT Abdomen and Pelvis 08/30/2015 and earlier FINDINGS: Gallbladder: Surgically absent Common bile duct: Diameter: 6 mm, normal Liver: Chronic 2.6 cm left hepatic lobe cyst (image 18) does demonstrate mild wall thickening, however, this lesion is stable on multiple prior CTs back to 2011. Similar 3.1 cm right lobe cyst, also not significantly changed since 2011. Underlying hepatic echotexture is within normal limits. No intrahepatic biliary ductal dilatation is evident. No new liver lesion identified. Other findings: Negative visible right kidney. No free fluid identified. IMPRESSION: No acute liver or biliary findings. 2-3 cm liver cysts which are mildly thick walled but benign, having not significantly changed since 2011. Electronically Signed   By: Genevie Ann M.D.   On: 01/17/2016 07:56    ASSESSMENT AND PLAN  1. PM system infection - he is pending a TEE today. Will plan lead extraction either Tuesday or Wednesday as schedule permits. 2. Acute on chronic  renal failure - creatinine elevated. He may need HD sooner than later. 3. Staph bacteremia - he is clinically improved. He will remain on IV anti-biotics.  Gregg Taylor,M.D.  01/26/2016 7:50 AM

## 2016-01-26 NOTE — CV Procedure (Addendum)
    PROCEDURE NOTE:  Procedure:  Transesophageal echocardiogram Operator:  Fransico Him, MD Indications:  bacteremia Complications: None  During this procedure the patient is administered a total of Versed 4 mg and Fentanyl 25 mg to achieve and maintain moderate conscious sedation.  The patient's heart rate, blood pressure, and oxygen saturation are monitored continuously during the procedure. The period of conscious sedation is 20 minutes, of which I was present face-to-face 100% of this time.  Results: Normal LV size and function Normal RV size and function Normal RA with 2 pacer wires noted within the cavity.  No evidence of mobile vegetation in either the 4 chamber or bicaval views at baseline or with definity contrast. Normal LA Normal TV with trivial TR Normal PV with trivial PR Normal MV with trivial MR Moderately thickened trileaflet AV with no AI Normal interatrial septum with no evidence of shunt by colorflow dopper  Moderate atherosclerosis of the  thoracic and ascending aorta.  No evidence of vegetation.  The patient tolerated the procedure well and was transferred back to their room in stable condition.  Signed: Fransico Him, MD [facility]

## 2016-01-26 NOTE — Progress Notes (Signed)
PT Cancellation Note  Patient Details Name: [name] ROBSON Sr. MRN: [id number] DOB: 12/22/1935   Cancelled Treatment:    Reason Eval/Treat Not Completed: Medical issues which prohibited therapy, pt with low BP and is symptomatic with dizziness and extreme fatigue. Will hold PT today.    [place], [place] 01/26/2016, 2:13 PM

## 2016-01-26 NOTE — Progress Notes (Signed)
Pt declined signing informed consent for TEE until his wife is present. Will make daytime RN aware.

## 2016-01-26 NOTE — Progress Notes (Signed)
Dr. Lovena Le discussed the TEE procedure risks/benefits with the patient, the patient is willing to proceed.  Tommye Standard, PA-C

## 2016-01-26 NOTE — Interval H&P Note (Signed)
History and Physical Interval Note:  01/26/2016 9:04 AM  [name] D Bobbye [place] Sr.  has presented today for surgery, with the diagnosis of possble infected pacemaker wire  bacteremia  The various methods of treatment have been discussed with the patient and family. After consideration of risks, benefits and other options for treatment, the patient has consented to  Procedure(s): TRANSESOPHAGEAL ECHOCARDIOGRAM (TEE) (N/A) as a surgical intervention .  The patient's history has been reviewed, patient examined, no change in status, stable for surgery.  I have reviewed the patient's chart and labs.  Questions were answered to the patient's satisfaction.     Fransico Him

## 2016-01-26 NOTE — Progress Notes (Signed)
  Echocardiogram Echocardiogram Transesophageal with Definity has been performed.  Tresa Res 01/26/2016, 10:00 AM

## 2016-01-26 NOTE — Progress Notes (Signed)
Day of Surgery Subjective: The patient was admitted with weakness and gross hematuria. Per his wife his hematuria has been worsening over the past week. Dr. Louis Meckel performed cystoscopy and noted a formed clot in the bladder. He is currently on hand irrigation and his urine is dark red. He denies any pain.  Objective: Vital signs in last 24 hours: Temp:  [97.9 F (36.6 C)-98.7 F (37.1 C)] 98.1 F (36.7 C) (07/17 2051) Pulse Rate:  [69-78] 72 (07/17 2051) Resp:  [10-20] 20 (07/17 2051) BP: (79-186)/(37-96) 127/60 mmHg (07/17 2051) SpO2:  [98 %-100 %] 100 % (07/17 2051) Weight:  [72.394 kg (159 lb 9.6 oz)] 72.394 kg (159 lb 9.6 oz) (07/17 0628)  Intake/Output from previous day: 07/16 0701 - 07/17 0700 In: 50 [I.V.:50] Out: 351 [Urine:250; Drains:100; Stool:1] Intake/Output this shift: Total I/O In: 335 [Blood:335] Out: 2800 [Urine:2800]  Physical Exam:  General:alert, cooperative and appears stated age GI: soft, non tender, normal bowel sounds, no palpable masses, no organomegaly, no inguinal hernia Male genitalia: no penile lesions or discharge no testicular masses no bladder distension noted Extremities: extremities normal, atraumatic, no cyanosis or edema  Lab Results:  Recent Labs  01/24/16 0317 01/25/16 0311 01/26/16 0513  HGB 8.1* 7.3* 7.0*  HCT 23.1* 21.3* 20.5*   BMET  Recent Labs  01/25/16 0311 01/26/16 0513  NA 130* 131*  K 4.3 4.5  CL 101 102  CO2 20* 20*  GLUCOSE 154* 197*  BUN 114* 123*  CREATININE 5.12* 5.71*  CALCIUM 8.6* 8.2*   No results for input(s): LABPT, INR in the last 72 hours. No results for input(s): LABURIN in the last 72 hours. Results for orders placed or performed during the hospital encounter of 01/16/16  Blood culture (routine x 2)     Status: Abnormal   Collection Time: 01/16/16  1:00 PM  Result Value Ref Range Status   Specimen Description LEFT ANTECUBITAL  Final   Special Requests BOTTLES DRAWN AEROBIC AND ANAEROBIC  10CC  Final   Culture  Setup Time   Final    GRAM POSITIVE COCCI IN CLUSTERS IN BOTH AEROBIC AND ANAEROBIC BOTTLES CRITICAL RESULT CALLED TO, READ BACK BY AND VERIFIED WITH: J MARKLE 01/17/16 @ 0927 M VESTAL    Culture (A)  Final    STAPHYLOCOCCUS AUREUS SUSCEPTIBILITIES PERFORMED ON PREVIOUS CULTURE WITHIN THE LAST 5 DAYS.    Report Status 01/19/2016 FINAL  Final  Blood culture (routine x 2)     Status: Abnormal   Collection Time: 01/16/16  1:06 PM  Result Value Ref Range Status   Specimen Description BLOOD LEFT HAND  Final   Special Requests AEROBIC BOTTLE ONLY 10CC  Final   Culture  Setup Time   Final    GRAM POSITIVE COCCI IN CLUSTERS AEROBIC BOTTLE ONLY CRITICAL RESULT CALLED TO, READ BACK BY AND VERIFIED WITH: J MARKLE 01/17/16 @ 0927 M VESTAL    Culture STAPHYLOCOCCUS AUREUS (A)  Final   Report Status 01/19/2016 FINAL  Final   Organism ID, Bacteria STAPHYLOCOCCUS AUREUS  Final      Susceptibility   Staphylococcus aureus - MIC*    CIPROFLOXACIN >=8 RESISTANT Resistant     ERYTHROMYCIN <=0.25 SENSITIVE Sensitive     GENTAMICIN <=0.5 SENSITIVE Sensitive     OXACILLIN 0.5 SENSITIVE Sensitive     TETRACYCLINE <=1 SENSITIVE Sensitive     VANCOMYCIN <=0.5 SENSITIVE Sensitive     TRIMETH/SULFA <=10 SENSITIVE Sensitive     CLINDAMYCIN <=0.25 SENSITIVE Sensitive  RIFAMPIN <=0.5 SENSITIVE Sensitive     Inducible Clindamycin NEGATIVE Sensitive     * STAPHYLOCOCCUS AUREUS  Blood Culture ID Panel (Reflexed)     Status: Abnormal   Collection Time: 01/16/16  1:06 PM  Result Value Ref Range Status   Enterococcus species NOT DETECTED NOT DETECTED Final   Vancomycin resistance NOT DETECTED NOT DETECTED Final   Listeria monocytogenes NOT DETECTED NOT DETECTED Final   Staphylococcus species DETECTED (A) NOT DETECTED Final    Comment: CRITICAL RESULT CALLED TO, READ BACK BY AND VERIFIED WITH: J MARKLE 01/17/16 @ 0927 M VESTAL    Staphylococcus aureus DETECTED (A) NOT DETECTED Final     Comment: CRITICAL RESULT CALLED TO, READ BACK BY AND VERIFIED WITH: J MARKLE 01/17/16 @ 0927 M VESTAL    Methicillin resistance NOT DETECTED NOT DETECTED Final   Streptococcus species NOT DETECTED NOT DETECTED Final   Streptococcus agalactiae NOT DETECTED NOT DETECTED Final   Streptococcus pneumoniae NOT DETECTED NOT DETECTED Final   Streptococcus pyogenes NOT DETECTED NOT DETECTED Final   Acinetobacter baumannii NOT DETECTED NOT DETECTED Final   Enterobacteriaceae species NOT DETECTED NOT DETECTED Final   Enterobacter cloacae complex NOT DETECTED NOT DETECTED Final   Escherichia coli NOT DETECTED NOT DETECTED Final   Klebsiella oxytoca NOT DETECTED NOT DETECTED Final   Klebsiella pneumoniae NOT DETECTED NOT DETECTED Final   Proteus species NOT DETECTED NOT DETECTED Final   Serratia marcescens NOT DETECTED NOT DETECTED Final   Carbapenem resistance NOT DETECTED NOT DETECTED Final   Haemophilus influenzae NOT DETECTED NOT DETECTED Final   Neisseria meningitidis NOT DETECTED NOT DETECTED Final   Pseudomonas aeruginosa NOT DETECTED NOT DETECTED Final   Candida albicans NOT DETECTED NOT DETECTED Final   Candida glabrata NOT DETECTED NOT DETECTED Final   Candida krusei NOT DETECTED NOT DETECTED Final   Candida parapsilosis NOT DETECTED NOT DETECTED Final   Candida tropicalis NOT DETECTED NOT DETECTED Final  Culture, sputum-assessment     Status: None   Collection Time: 01/16/16 10:24 PM  Result Value Ref Range Status   Specimen Description EXPECTORATED SPUTUM  Final   Special Requests NONE  Final   Sputum evaluation   Final    THIS SPECIMEN IS ACCEPTABLE. RESPIRATORY CULTURE REPORT TO FOLLOW.   Report Status 01/17/2016 FINAL  Final  Culture, respiratory (NON-Expectorated)     Status: None   Collection Time: 01/16/16 10:24 PM  Result Value Ref Range Status   Specimen Description EXPECTORATED SPUTUM  Final   Special Requests NONE  Final   Gram Stain   Final    RARE SQUAMOUS EPITHELIAL  CELLS PRESENT ABUNDANT WBC PRESENT,BOTH PMN AND MONONUCLEAR FEW GRAM POSITIVE COCCI IN PAIRS IN CLUSTERS    Culture Consistent with normal respiratory flora.  Final   Report Status 01/19/2016 FINAL  Final  Culture, blood (routine x 2)     Status: None   Collection Time: 01/17/16  9:50 AM  Result Value Ref Range Status   Specimen Description BLOOD BLOOD LEFT ARM  Final   Special Requests BOTTLES DRAWN AEROBIC ONLY 5CC  Final   Culture NO GROWTH 5 DAYS  Final   Report Status 01/22/2016 FINAL  Final  Culture, blood (routine x 2)     Status: None   Collection Time: 01/17/16  9:50 AM  Result Value Ref Range Status   Specimen Description BLOOD LEFT ANTECUBITAL  Final   Special Requests BOTTLES DRAWN AEROBIC ONLY 5CC  Final   Culture NO GROWTH  5 DAYS  Final   Report Status 01/22/2016 FINAL  Final  Acid Fast Smear (AFB)     Status: None   Collection Time: 01/17/16 11:26 AM  Result Value Ref Range Status   AFB Specimen Processing Concentration  Final   Acid Fast Smear Negative  Final    Comment: (NOTE) Performed At: [facility] [place], [place] [id number] Lindon Romp MD WU:[id number]    Source (AFB) SPUTUM  Final  Body fluid culture     Status: None   Collection Time: 01/19/16  4:37 PM  Result Value Ref Range Status   Specimen Description FLUID SYNOVIAL WRIST LEFT  Final   Special Requests NONE  Final   Gram Stain   Final    ABUNDANT WBC PRESENT,BOTH PMN AND MONONUCLEAR NO ORGANISMS SEEN    Culture NO GROWTH 3 DAYS  Final   Report Status 01/22/2016 FINAL  Final  Aerobic/Anaerobic Culture (surgical/deep wound)     Status: None   Collection Time: 01/21/16  6:52 PM  Result Value Ref Range Status   Specimen Description WOUND LEFT WRIST  Final   Special Requests NONE  Final   Gram Stain   Final    WBC PRESENT,BOTH PMN AND MONONUCLEAR NO ORGANISMS SEEN    Culture No growth aerobically or anaerobically.  Final   Report Status 01/26/2016 FINAL  Final     Studies/Results: Ir Fluoro Guide Cv Line Left  01/25/2016  INDICATION: Bacteremia due to Staph aureus infection. History of chronic renal insufficiency. Request made for placement of a tunneled PICC line for antibiotic administration. EXAM: TUNNELED PICC LINE WITH ULTRASOUND AND FLUOROSCOPIC GUIDANCE MEDICATIONS: The patient is currently admitted to the hospital and receiving intravenous antibiotics. The antibiotic was given in an appropriate time interval prior to skin puncture. ANESTHESIA/SEDATION: Moderate (conscious) sedation was employed during this procedure. A total of Versed 0.5 mg and Fentanyl 25 mcg was administered intravenously. Moderate Sedation Time: 33 minutes. The patient's level of consciousness and vital signs were monitored continuously by radiology nursing throughout the procedure under my direct supervision. COMPLICATIONS: None immediate. PROCEDURE: Informed written consent was obtained from the patient after a discussion of the risks, benefits, and alternatives to treatment. Questions regarding the procedure were encouraged and answered. Given the presence of the right anterior chest wall pacemaker, the decision was made to place a left internal jugular approach dual lumen PICC line. As such, the left neck and chest were prepped with chlorhexidine in a sterile fashion, and a sterile drape was applied covering the operative field. Maximum barrier sterile technique with sterile gowns and gloves were used for the procedure. A timeout was performed prior to the initiation of the procedure. After creating a small venotomy incision, a micropuncture kit was utilized to access the left internal jugular vein under direct, real-time ultrasound guidance after the overlying soft tissues were anesthetized with 1% lidocaine with epinephrine. Ultrasound image documentation was performed. Despite several attempts, there was difficulty advancing the micro wire centrally. As such, a limited central  venogram was performed via the inner 3 French sheath from the micropuncture kit demonstrating patency though marked tortuosity of the central aspect of the left internal jugular vein. Ultimately, a Nitrex wire was able to be advanced centrally. The microwire was kinked to measure appropriate catheter length. The micropuncture sheath was exchanged for a peel-away sheath over a guidewire. Initially, a 5 [place] dual lumen tunneled PICC measuring 28 cm was tunneled in a retrograde fashion from the anterior  chest wall to the venotomy incision. Unfortunately, the initial catheter was noted to retract within the tortuous left innominate vein with with tip terminating at the level of the SVC. As such, the existing PICC line was cannulated with a GT Glidewire which was advanced to the level of the IVC. Under intermittent fluoroscopic guidance, the existing 28 cm tunneled PICC line was exchanged for a new slightly longer 31 cm dual lumen tunneled PICC line with tip ultimately terminating within the superior aspect the right atrium. Final catheter positioning was confirmed and documented with a spot radiographic image. The catheter aspirates and flushes normally. The catheter was flushed with appropriate volume heparin dwells. The catheter exit site was secured with a 0-Prolene retention suture. The venotomy incision was closed with Dermabond and Steri-strips. Dressings were applied. The patient tolerated the procedure well without immediate post procedural complication. FINDINGS: After catheter placement, the tip lies within the superior cavoatrial junction. The catheter aspirates and flushes normally and is ready for immediate use. IMPRESSION: Successful placement of 31 cm dual lumen tunneled PICC catheter via the left internal jugular vein with tip terminating at the superior caval atrial junction. The catheter is ready for immediate use. Electronically Signed   By: Sandi Mariscal M.D.   On: 01/25/2016 13:51   Ir US Guide  Vasc Access Left  01/25/2016  INDICATION: Bacteremia due to Staph aureus infection. History of chronic renal insufficiency. Request made for placement of a tunneled PICC line for antibiotic administration. EXAM: TUNNELED PICC LINE WITH ULTRASOUND AND FLUOROSCOPIC GUIDANCE MEDICATIONS: The patient is currently admitted to the hospital and receiving intravenous antibiotics. The antibiotic was given in an appropriate time interval prior to skin puncture. ANESTHESIA/SEDATION: Moderate (conscious) sedation was employed during this procedure. A total of Versed 0.5 mg and Fentanyl 25 mcg was administered intravenously. Moderate Sedation Time: 33 minutes. The patient's level of consciousness and vital signs were monitored continuously by radiology nursing throughout the procedure under my direct supervision. COMPLICATIONS: None immediate. PROCEDURE: Informed written consent was obtained from the patient after a discussion of the risks, benefits, and alternatives to treatment. Questions regarding the procedure were encouraged and answered. Given the presence of the right anterior chest wall pacemaker, the decision was made to place a left internal jugular approach dual lumen PICC line. As such, the left neck and chest were prepped with chlorhexidine in a sterile fashion, and a sterile drape was applied covering the operative field. Maximum barrier sterile technique with sterile gowns and gloves were used for the procedure. A timeout was performed prior to the initiation of the procedure. After creating a small venotomy incision, a micropuncture kit was utilized to access the left internal jugular vein under direct, real-time ultrasound guidance after the overlying soft tissues were anesthetized with 1% lidocaine with epinephrine. Ultrasound image documentation was performed. Despite several attempts, there was difficulty advancing the micro wire centrally. As such, a limited central venogram was performed via the inner 3  French sheath from the micropuncture kit demonstrating patency though marked tortuosity of the central aspect of the left internal jugular vein. Ultimately, a Nitrex wire was able to be advanced centrally. The microwire was kinked to measure appropriate catheter length. The micropuncture sheath was exchanged for a peel-away sheath over a guidewire. Initially, a 5 [place] dual lumen tunneled PICC measuring 28 cm was tunneled in a retrograde fashion from the anterior chest wall to the venotomy incision. Unfortunately, the initial catheter was noted to retract within the tortuous left innominate vein  with with tip terminating at the level of the SVC. As such, the existing PICC line was cannulated with a GT Glidewire which was advanced to the level of the IVC. Under intermittent fluoroscopic guidance, the existing 28 cm tunneled PICC line was exchanged for a new slightly longer 31 cm dual lumen tunneled PICC line with tip ultimately terminating within the superior aspect the right atrium. Final catheter positioning was confirmed and documented with a spot radiographic image. The catheter aspirates and flushes normally. The catheter was flushed with appropriate volume heparin dwells. The catheter exit site was secured with a 0-Prolene retention suture. The venotomy incision was closed with Dermabond and Steri-strips. Dressings were applied. The patient tolerated the procedure well without immediate post procedural complication. FINDINGS: After catheter placement, the tip lies within the superior cavoatrial junction. The catheter aspirates and flushes normally and is ready for immediate use. IMPRESSION: Successful placement of 31 cm dual lumen tunneled PICC catheter via the left internal jugular vein with tip terminating at the superior caval atrial junction. The catheter is ready for immediate use. Electronically Signed   By: Sandi Mariscal M.D.   On: 01/25/2016 13:51    Assessment/Plan: 80yo with gross hematuria  1.  Please start continuous bladder irrigation and titrate drip to light pink urine. Please irrigate PRN for clots Urology to continue to follow   LOS: 10 days   Nicolette Bang 01/26/2016, 9:17 PM

## 2016-01-26 NOTE — H&P (View-Only) (Signed)
Patient ID: [name] Brow., male   DOB: December 10, 1935, 80 y.o.   MRN: [id number]    Patient Name: [name] HABIB Sr. Date of Encounter: 01/26/2016     Principal Problem:   Staphylococcus aureus bacteremia Active Problems:   PAF (paroxysmal atrial fibrillation) (HCC)   Chronic diastolic heart failure (HCC)   Diabetes mellitus (HCC)   Essential hypertension   CKD (chronic kidney disease), stage IV (HCC)   Anemia of chronic disease   MDS (myelodysplastic syndrome) (HCC)   CAD of autologous bypass graft   Pacemaker   Hematuria   CAP (community acquired pneumonia)   Hemoptysis   Pain   Wrist swelling   Pseudogout involving multiple joints   Bladder mass    SUBJECTIVE  No specific complaints.   CURRENT MEDS . amiodarone  100 mg Oral Daily  . atorvastatin  80 mg Oral QHS  . Calcifediol ER  30 mcg Oral QHS  . carvedilol  25 mg Oral BID WC  .  ceFAZolin (ANCEF) IV  1 g Intravenous Q24H  . cloNIDine  0.1 mg Oral BID  . hydrALAZINE  50 mg Oral TID  . insulin aspart  0-5 Units Subcutaneous QHS  . insulin aspart  0-9 Units Subcutaneous TID WC  . insulin aspart  3 Units Subcutaneous TID WC  . isosorbide mononitrate  120 mg Oral Daily  . multivitamin with minerals  1 tablet Oral q morning - 10a  . predniSONE  50 mg Oral Q breakfast  . rifampin  600 mg Oral Daily  . tamsulosin  0.4 mg Oral QPC supper    OBJECTIVE  Filed Vitals:   01/25/16 1901 01/25/16 2017 01/25/16 2238 01/26/16 0628  BP: 137/76 107/53 146/57 139/53  Pulse:  88  71  Temp:  98.9 F (37.2 C)  98.6 F (37 C)  TempSrc:  Oral  Oral  Resp:  18  20  Height:      Weight:    159 lb 9.6 oz (72.394 kg)  SpO2:  99%  98%    Intake/Output Summary (Last 24 hours) at 01/26/16 0750 Last data filed at 01/26/16 2979  Gross per 24 hour  Intake      0 ml  Output    351 ml  Net   -351 ml   Filed Weights   01/24/16 0416 01/25/16 0534 01/26/16 0628  Weight: 170 lb 14.4 oz (77.52 kg) 168 lb 1.6 oz (76.25 kg)  159 lb 9.6 oz (72.394 kg)    PHYSICAL EXAM  General: Pleasant, chronically ill appearing, NAD. Neuro: Alert and oriented X 3. Moves all extremities spontaneously. Psych: blunted affect. HEENT:  Normal  Neck: Supple without bruits or JVD. Lungs:  Resp regular and unlabored, CTA. Heart: RRR no s3, s4, or murmurs. Abdomen: Soft, non-tender, non-distended, BS + x 4.  Extremities: No clubbing, cyanosis or edema. DP/PT/Radials 2+ and equal bilaterally.  Accessory Clinical Findings  CBC  Recent Labs  01/25/16 0311 01/26/16 0513  WBC 8.6 8.6  HGB 7.3* 7.0*  HCT 21.3* 20.5*  MCV 92.6 93.2  PLT 238 892   Basic Metabolic Panel  Recent Labs  01/25/16 0311 01/26/16 0513  NA 130* 131*  K 4.3 4.5  CL 101 102  CO2 20* 20*  GLUCOSE 154* 197*  BUN 114* 123*  CREATININE 5.12* 5.71*  CALCIUM 8.6* 8.2*  PHOS  --  4.1   Liver Function Tests  Recent Labs  01/26/16 0513  ALBUMIN 2.0*   No results  for input(s): LIPASE, AMYLASE in the last 72 hours. Cardiac Enzymes  Recent Labs  01/25/16 1519  CKTOTAL 60   BNP Invalid input(s): POCBNP D-Dimer No results for input(s): DDIMER in the last 72 hours. Hemoglobin A1C No results for input(s): HGBA1C in the last 72 hours. Fasting Lipid Panel No results for input(s): CHOL, HDL, LDLCALC, TRIG, CHOLHDL, LDLDIRECT in the last 72 hours. Thyroid Function Tests No results for input(s): TSH, T4TOTAL, T3FREE, THYROIDAB in the last 72 hours.  Invalid input(s): FREET3  TELE  nsr  Radiology/Studies  Dg Chest 2 View  01/16/2016  CLINICAL DATA:  Chest pain, cough for 2 days EXAM: CHEST  2 VIEW COMPARISON:  CT chest 04/03/2014 FINDINGS: Cardiomediastinal silhouette is unremarkable. Status post median sternotomy. Dual lead cardiac pacemaker with leads in right atrium and right ventricle. There is streaky airspace opacification in right base infrahilar region and right upper lobe peripheral perihilar. Findings highly suspicious for  pneumonia. Probable small right pleural effusion. Left lung is clear. IMPRESSION: There is streaky airspace opacification in right base infrahilar region and right upper lobe peripheral perihilar. Findings highly suspicious for pneumonia. Followup PA and lateral chest X-ray is recommended in 3-4 weeks following trial of antibiotic therapy to ensure resolution and exclude underlying malignancy. Probable small right pleural effusion. Electronically Signed   By: Lahoma Crocker M.D.   On: 01/16/2016 13:58   Dg Wrist 2 Views Left  01/18/2016  CLINICAL DATA:  Acute onset of left wrist pain.  Initial encounter. EXAM: LEFT WRIST - 2 VIEW COMPARISON:  None. FINDINGS: There is no evidence of fracture or dislocation. There is joint space narrowing along the radiocarpal joint, and at the radial aspect of the carpal rows. This resolves in mild chronic deformity of the scaphoid and lunate, with expansion of the scapholunate distance to 5 mm. Scattered vascular calcifications are seen. No additional soft tissue abnormalities are characterized on radiograph. IMPRESSION: 1. No evidence of fracture or dislocation. 2. Degenerative joint space narrowing along the radiocarpal joints, and at the radial aspect of the carpal rows. This results in mild chronic deformity of the scaphoid and lunate, with expansion of the scapholunate distance to 5 mm, reflecting scapholunate dissociation. Electronically Signed   By: Garald Balding M.D.   On: 01/18/2016 16:57   Ct Chest Wo Contrast  01/16/2016  CLINICAL DATA:  Hemoptysis, cough.  Symptoms since Monday. EXAM: CT CHEST WITHOUT CONTRAST TECHNIQUE: TECHNIQUE Multidetector CT imaging of the chest was performed following the standard protocol without IV contrast. COMPARISON:  Chest radiograph 01/16/2016, CT 04/03/2014 FINDINGS: Cardiovascular: Calcification of the thoracic aorta. Coronary bypass graft. Pacer wires in the RIGHT heart. No pericardial fluid. Mediastinum/Nodes: No axillary or  supraclavicular adenopathy. No mediastinal hilar adenopathy. Esophagus normal. Lungs/Pleura: There is consolidative airspace disease in the RIGHT lower lobe with air bronchograms. A similar pattern in the posterior aspect the RIGHT upper lobe. Smaller pattern of consolidation in the LEFT lower lobe. Findings are consistent with multifocal pneumonia. Upper Abdomen: Limited view of the liver, kidneys, pancreas are unremarkable. Normal adrenal glands. Musculoskeletal: No aggressive osseous lesion.  Midline sternotomy. IMPRESSION: 1. Multifocal pneumonia with most dense infection in RIGHT lower lobe. 2. Coronary artery calcification and aortic atherosclerotic calcification. Electronically Signed   By: Suzy Bouchard M.D.   On: 01/16/2016 16:32   US Renal  01/17/2016  CLINICAL DATA:  80 year old male with microscopic hematuria. Personal history of prostate cancer. Initial encounter. EXAM: RENAL / URINARY TRACT ULTRASOUND COMPLETE COMPARISON:  CT Abdomen and  Pelvis 08/30/2015, and earlier. Right upper quadrant ultrasound from today reported separately. FINDINGS: Right Kidney: Length: 12.3 cm. Echogenicity within normal limits. No mass or hydronephrosis visualized. Left Kidney: Length: 12.0 cm. Echogenicity within normal limits. No mass or hydronephrosis visualized. Bladder: 5 cm area which is dependent, complex, and mass like (image 15), however, there is no internal vascularity detected with brief color Doppler interrogation. Elsewhere the urinary bladder appears normal. Prostate measured to be 5.2 x 3.1 x 4.3 cm. IMPRESSION: 1. Abnormal 5 cm complex, mass-like area of dependent echogenicity in the urinary bladder. However, lack of internal vascularity on Doppler suggests this is more likely blood clot rather than a bladder tumor. Further evaluation recommended. 2. Normal for age sonographic appearance of both kidneys. Electronically Signed   By: Genevie Ann M.D.   On: 01/17/2016 08:01   US Renal  01/09/2016  CLINICAL  DATA:  Two weeks of hematuria ; history of prostate enlargement EXAM: RENAL / URINARY TRACT ULTRASOUND COMPLETE COMPARISON:  Abdominal pelvic CT scan of July 30, 2015 FINDINGS: Right Kidney: Length: 11.5 cm. The renal cortical echotexture is equal to or slightly greater than that of the adjacent liver. There is no hydronephrosis nor discrete mass. Left Kidney: Length: 10.3 cm. The renal cortical echotexture is mildly increased similar to that on the right. There is no hydronephrosis. Bladder: There is an irregular mass in the posterior aspect of the urinary bladder wall measuring 2.5 x 2 x 4.4 cm. This appears separate from the more anteriorly and inferiorly positioned prostate gland. The prostate gland itself does produce a mild impression upon the urinary bladder base. IMPRESSION: 1. Posterior bladder wall mass worrisome for malignancy. This is separate from the mildly enlarged prostate gland. 2. Increased renal cortical echotexture bilaterally consistent with medical renal disease. There is no hydronephrosis. Electronically Signed   By: David  Martinique M.D.   On: 01/09/2016 14:44   Ir Fluoro Guide Cv Line Left  01/25/2016  INDICATION: Bacteremia due to Staph aureus infection. History of chronic renal insufficiency. Request made for placement of a tunneled PICC line for antibiotic administration. EXAM: TUNNELED PICC LINE WITH ULTRASOUND AND FLUOROSCOPIC GUIDANCE MEDICATIONS: The patient is currently admitted to the hospital and receiving intravenous antibiotics. The antibiotic was given in an appropriate time interval prior to skin puncture. ANESTHESIA/SEDATION: Moderate (conscious) sedation was employed during this procedure. A total of Versed 0.5 mg and Fentanyl 25 mcg was administered intravenously. Moderate Sedation Time: 33 minutes. The patient's level of consciousness and vital signs were monitored continuously by radiology nursing throughout the procedure under my direct supervision. COMPLICATIONS: None  immediate. PROCEDURE: Informed written consent was obtained from the patient after a discussion of the risks, benefits, and alternatives to treatment. Questions regarding the procedure were encouraged and answered. Given the presence of the right anterior chest wall pacemaker, the decision was made to place a left internal jugular approach dual lumen PICC line. As such, the left neck and chest were prepped with chlorhexidine in a sterile fashion, and a sterile drape was applied covering the operative field. Maximum barrier sterile technique with sterile gowns and gloves were used for the procedure. A timeout was performed prior to the initiation of the procedure. After creating a small venotomy incision, a micropuncture kit was utilized to access the left internal jugular vein under direct, real-time ultrasound guidance after the overlying soft tissues were anesthetized with 1% lidocaine with epinephrine. Ultrasound image documentation was performed. Despite several attempts, there was difficulty advancing the micro wire  centrally. As such, a limited central venogram was performed via the inner 3 French sheath from the micropuncture kit demonstrating patency though marked tortuosity of the central aspect of the left internal jugular vein. Ultimately, a Nitrex wire was able to be advanced centrally. The microwire was kinked to measure appropriate catheter length. The micropuncture sheath was exchanged for a peel-away sheath over a guidewire. Initially, a 5 [place] dual lumen tunneled PICC measuring 28 cm was tunneled in a retrograde fashion from the anterior chest wall to the venotomy incision. Unfortunately, the initial catheter was noted to retract within the tortuous left innominate vein with with tip terminating at the level of the SVC. As such, the existing PICC line was cannulated with a GT Glidewire which was advanced to the level of the IVC. Under intermittent fluoroscopic guidance, the existing 28 cm tunneled  PICC line was exchanged for a new slightly longer 31 cm dual lumen tunneled PICC line with tip ultimately terminating within the superior aspect the right atrium. Final catheter positioning was confirmed and documented with a spot radiographic image. The catheter aspirates and flushes normally. The catheter was flushed with appropriate volume heparin dwells. The catheter exit site was secured with a 0-Prolene retention suture. The venotomy incision was closed with Dermabond and Steri-strips. Dressings were applied. The patient tolerated the procedure well without immediate post procedural complication. FINDINGS: After catheter placement, the tip lies within the superior cavoatrial junction. The catheter aspirates and flushes normally and is ready for immediate use. IMPRESSION: Successful placement of 31 cm dual lumen tunneled PICC catheter via the left internal jugular vein with tip terminating at the superior caval atrial junction. The catheter is ready for immediate use. Electronically Signed   By: Sandi Mariscal M.D.   On: 01/25/2016 13:51   Ir US Guide Vasc Access Left  01/25/2016  INDICATION: Bacteremia due to Staph aureus infection. History of chronic renal insufficiency. Request made for placement of a tunneled PICC line for antibiotic administration. EXAM: TUNNELED PICC LINE WITH ULTRASOUND AND FLUOROSCOPIC GUIDANCE MEDICATIONS: The patient is currently admitted to the hospital and receiving intravenous antibiotics. The antibiotic was given in an appropriate time interval prior to skin puncture. ANESTHESIA/SEDATION: Moderate (conscious) sedation was employed during this procedure. A total of Versed 0.5 mg and Fentanyl 25 mcg was administered intravenously. Moderate Sedation Time: 33 minutes. The patient's level of consciousness and vital signs were monitored continuously by radiology nursing throughout the procedure under my direct supervision. COMPLICATIONS: None immediate. PROCEDURE: Informed written  consent was obtained from the patient after a discussion of the risks, benefits, and alternatives to treatment. Questions regarding the procedure were encouraged and answered. Given the presence of the right anterior chest wall pacemaker, the decision was made to place a left internal jugular approach dual lumen PICC line. As such, the left neck and chest were prepped with chlorhexidine in a sterile fashion, and a sterile drape was applied covering the operative field. Maximum barrier sterile technique with sterile gowns and gloves were used for the procedure. A timeout was performed prior to the initiation of the procedure. After creating a small venotomy incision, a micropuncture kit was utilized to access the left internal jugular vein under direct, real-time ultrasound guidance after the overlying soft tissues were anesthetized with 1% lidocaine with epinephrine. Ultrasound image documentation was performed. Despite several attempts, there was difficulty advancing the micro wire centrally. As such, a limited central venogram was performed via the inner 3 French sheath from the micropuncture kit demonstrating  patency though marked tortuosity of the central aspect of the left internal jugular vein. Ultimately, a Nitrex wire was able to be advanced centrally. The microwire was kinked to measure appropriate catheter length. The micropuncture sheath was exchanged for a peel-away sheath over a guidewire. Initially, a 5 [place] dual lumen tunneled PICC measuring 28 cm was tunneled in a retrograde fashion from the anterior chest wall to the venotomy incision. Unfortunately, the initial catheter was noted to retract within the tortuous left innominate vein with with tip terminating at the level of the SVC. As such, the existing PICC line was cannulated with a GT Glidewire which was advanced to the level of the IVC. Under intermittent fluoroscopic guidance, the existing 28 cm tunneled PICC line was exchanged for a new  slightly longer 31 cm dual lumen tunneled PICC line with tip ultimately terminating within the superior aspect the right atrium. Final catheter positioning was confirmed and documented with a spot radiographic image. The catheter aspirates and flushes normally. The catheter was flushed with appropriate volume heparin dwells. The catheter exit site was secured with a 0-Prolene retention suture. The venotomy incision was closed with Dermabond and Steri-strips. Dressings were applied. The patient tolerated the procedure well without immediate post procedural complication. FINDINGS: After catheter placement, the tip lies within the superior cavoatrial junction. The catheter aspirates and flushes normally and is ready for immediate use. IMPRESSION: Successful placement of 31 cm dual lumen tunneled PICC catheter via the left internal jugular vein with tip terminating at the superior caval atrial junction. The catheter is ready for immediate use. Electronically Signed   By: Sandi Mariscal M.D.   On: 01/25/2016 13:51   Dg Fluoro Guided Needle Plc Aspiration/injection Loc  01/21/2016  CLINICAL DATA:  Left wrist pain and swelling.  Sepsis. EXAM: LEFT WRIST ASPIRATION UNDER FLUOROSCOPY FLUOROSCOPY TIME:  Fluoroscopy Time (in minutes and seconds): 24 SECONDS PROCEDURE: Overlying skin prepped with Betadine, draped in the usual sterile fashion, and infiltrated locally with buffered Lidocaine. 20 gauge needle advanced into the radiocarpal joint under direct fluoroscopic visualization. Approximately 3 cc of bloody fluid was aspirated from the joint and overlying soft tissues. IMPRESSION: Left radiocarpal joint aspiration performed without immediate complication. Mr. Rieth with tolerated the procedure well. Technically successful  hip injection under fluoroscopy. Electronically Signed   By: Lorriane Shire M.D.   On: 01/21/2016 08:10   US Abdomen Limited Ruq  01/17/2016  CLINICAL DATA:  80 year old male with abnormal LFTs. Prior  cholecystectomy. Pneumonia. Initial encounter. EXAM: US ABDOMEN LIMITED - RIGHT UPPER QUADRANT COMPARISON:  Noncontrast chest CT 01/16/2016. CT Abdomen and Pelvis 08/30/2015 and earlier FINDINGS: Gallbladder: Surgically absent Common bile duct: Diameter: 6 mm, normal Liver: Chronic 2.6 cm left hepatic lobe cyst (image 18) does demonstrate mild wall thickening, however, this lesion is stable on multiple prior CTs back to 2011. Similar 3.1 cm right lobe cyst, also not significantly changed since 2011. Underlying hepatic echotexture is within normal limits. No intrahepatic biliary ductal dilatation is evident. No new liver lesion identified. Other findings: Negative visible right kidney. No free fluid identified. IMPRESSION: No acute liver or biliary findings. 2-3 cm liver cysts which are mildly thick walled but benign, having not significantly changed since 2011. Electronically Signed   By: Genevie Ann M.D.   On: 01/17/2016 07:56    ASSESSMENT AND PLAN  1. PM system infection - he is pending a TEE today. Will plan lead extraction either Tuesday or Wednesday as schedule permits. 2. Acute on chronic  renal failure - creatinine elevated. He may need HD sooner than later. 3. Staph bacteremia - he is clinically improved. He will remain on IV anti-biotics.  Affan Callow,M.D.  01/26/2016 7:50 AM

## 2016-01-26 NOTE — Progress Notes (Signed)
Patient ID: [name] Brow., male   DOB: Jul 16, 1935, 80 y.o.   MRN: [id number] S:no new complaints and has remained firm in his decision not to pursue dialysis. O:BP 186/41 mmHg  Pulse 74  Temp(Src) 98.7 F (37.1 C) (Oral)  Resp 15  Ht '5\' 10"'$  (1.778 m)  Wt 72.394 kg (159 lb 9.6 oz)  BMI 22.90 kg/m2  SpO2 100%  Intake/Output Summary (Last 24 hours) at 01/26/16 0911 Last data filed at 01/26/16 0849  Gross per 24 hour  Intake      0 ml  Output    351 ml  Net   -351 ml   Intake/Output: I/O last 3 completed shifts: In: 340 [P.O.:240; IV Piggyback:100] Out: 353 [Urine:250; Drains:100; Stool:3]  Intake/Output this shift:    Weight change: -3.856 kg (-8 lb 8 oz) MHD:QQIWL elderly AAM in NAD CVS:no rub Resp:cta NLG:XQJJHE Ext:no edema   Recent Labs Lab 01/20/16 0309 01/21/16 0314 01/22/16 0523 01/23/16 0239 01/24/16 0317 01/25/16 0311 01/26/16 0513  NA 135 132* 131* 128* 131* 130* 131*  K 3.2* 4.3 3.9 3.7 4.2 4.3 4.5  CL 100* 100* 101 98* 102 101 102  CO2 23 24 20* 20* 20* 20* 20*  GLUCOSE 117* 128* 94 127* 192* 154* 197*  BUN 73* 71* 82* 83* 100* 114* 123*  CREATININE 3.02* 3.06* 3.51* 3.69* 4.20* 5.12* 5.71*  ALBUMIN  --   --   --   --   --   --  2.0*  CALCIUM 9.2 9.0 8.8* 8.5* 8.7* 8.6* 8.2*  PHOS  --   --   --   --   --   --  4.1   Liver Function Tests:  Recent Labs Lab 01/26/16 0513  ALBUMIN 2.0*   No results for input(s): LIPASE, AMYLASE in the last 168 hours. No results for input(s): AMMONIA in the last 168 hours. CBC:  Recent Labs Lab 01/23/16 0239 01/24/16 0317 01/25/16 0311 01/26/16 0513  WBC 9.6 10.0 8.6 8.6  HGB 7.9* 8.1* 7.3* 7.0*  HCT 22.9* 23.1* 21.3* 20.5*  MCV 92.3 91.7 92.6 93.2  PLT 169 212 238 251   Cardiac Enzymes:  Recent Labs Lab 01/25/16 1519  CKTOTAL 60   CBG:  Recent Labs Lab 01/25/16 0647 01/25/16 1406 01/25/16 1740 01/25/16 2241 01/26/16 0701  GLUCAP 148* 160* 280* 244* 180*    Iron Studies: No  results for input(s): IRON, TIBC, TRANSFERRIN, FERRITIN in the last 72 hours. Studies/Results: Ir Fluoro Guide Cv Line Left  01/25/2016  INDICATION: Bacteremia due to Staph aureus infection. History of chronic renal insufficiency. Request made for placement of a tunneled PICC line for antibiotic administration. EXAM: TUNNELED PICC LINE WITH ULTRASOUND AND FLUOROSCOPIC GUIDANCE MEDICATIONS: The patient is currently admitted to the hospital and receiving intravenous antibiotics. The antibiotic was given in an appropriate time interval prior to skin puncture. ANESTHESIA/SEDATION: Moderate (conscious) sedation was employed during this procedure. A total of Versed 0.5 mg and Fentanyl 25 mcg was administered intravenously. Moderate Sedation Time: 33 minutes. The patient's level of consciousness and vital signs were monitored continuously by radiology nursing throughout the procedure under my direct supervision. COMPLICATIONS: None immediate. PROCEDURE: Informed written consent was obtained from the patient after a discussion of the risks, benefits, and alternatives to treatment. Questions regarding the procedure were encouraged and answered. Given the presence of the right anterior chest wall pacemaker, the decision was made to place a left internal jugular approach dual lumen PICC line. As such, the left  neck and chest were prepped with chlorhexidine in a sterile fashion, and a sterile drape was applied covering the operative field. Maximum barrier sterile technique with sterile gowns and gloves were used for the procedure. A timeout was performed prior to the initiation of the procedure. After creating a small venotomy incision, a micropuncture kit was utilized to access the left internal jugular vein under direct, real-time ultrasound guidance after the overlying soft tissues were anesthetized with 1% lidocaine with epinephrine. Ultrasound image documentation was performed. Despite several attempts, there was  difficulty advancing the micro wire centrally. As such, a limited central venogram was performed via the inner 3 French sheath from the micropuncture kit demonstrating patency though marked tortuosity of the central aspect of the left internal jugular vein. Ultimately, a Nitrex wire was able to be advanced centrally. The microwire was kinked to measure appropriate catheter length. The micropuncture sheath was exchanged for a peel-away sheath over a guidewire. Initially, a 5 [place] dual lumen tunneled PICC measuring 28 cm was tunneled in a retrograde fashion from the anterior chest wall to the venotomy incision. Unfortunately, the initial catheter was noted to retract within the tortuous left innominate vein with with tip terminating at the level of the SVC. As such, the existing PICC line was cannulated with a GT Glidewire which was advanced to the level of the IVC. Under intermittent fluoroscopic guidance, the existing 28 cm tunneled PICC line was exchanged for a new slightly longer 31 cm dual lumen tunneled PICC line with tip ultimately terminating within the superior aspect the right atrium. Final catheter positioning was confirmed and documented with a spot radiographic image. The catheter aspirates and flushes normally. The catheter was flushed with appropriate volume heparin dwells. The catheter exit site was secured with a 0-Prolene retention suture. The venotomy incision was closed with Dermabond and Steri-strips. Dressings were applied. The patient tolerated the procedure well without immediate post procedural complication. FINDINGS: After catheter placement, the tip lies within the superior cavoatrial junction. The catheter aspirates and flushes normally and is ready for immediate use. IMPRESSION: Successful placement of 31 cm dual lumen tunneled PICC catheter via the left internal jugular vein with tip terminating at the superior caval atrial junction. The catheter is ready for immediate use.  Electronically Signed   By: Simonne Come M.D.   On: 01/25/2016 13:51   Ir US Guide Vasc Access Left  01/25/2016  INDICATION: Bacteremia due to Staph aureus infection. History of chronic renal insufficiency. Request made for placement of a tunneled PICC line for antibiotic administration. EXAM: TUNNELED PICC LINE WITH ULTRASOUND AND FLUOROSCOPIC GUIDANCE MEDICATIONS: The patient is currently admitted to the hospital and receiving intravenous antibiotics. The antibiotic was given in an appropriate time interval prior to skin puncture. ANESTHESIA/SEDATION: Moderate (conscious) sedation was employed during this procedure. A total of Versed 0.5 mg and Fentanyl 25 mcg was administered intravenously. Moderate Sedation Time: 33 minutes. The patient's level of consciousness and vital signs were monitored continuously by radiology nursing throughout the procedure under my direct supervision. COMPLICATIONS: None immediate. PROCEDURE: Informed written consent was obtained from the patient after a discussion of the risks, benefits, and alternatives to treatment. Questions regarding the procedure were encouraged and answered. Given the presence of the right anterior chest wall pacemaker, the decision was made to place a left internal jugular approach dual lumen PICC line. As such, the left neck and chest were prepped with chlorhexidine in a sterile fashion, and a sterile drape was applied covering the operative  field. Maximum barrier sterile technique with sterile gowns and gloves were used for the procedure. A timeout was performed prior to the initiation of the procedure. After creating a small venotomy incision, a micropuncture kit was utilized to access the left internal jugular vein under direct, real-time ultrasound guidance after the overlying soft tissues were anesthetized with 1% lidocaine with epinephrine. Ultrasound image documentation was performed. Despite several attempts, there was difficulty advancing the micro  wire centrally. As such, a limited central venogram was performed via the inner 3 French sheath from the micropuncture kit demonstrating patency though marked tortuosity of the central aspect of the left internal jugular vein. Ultimately, a Nitrex wire was able to be advanced centrally. The microwire was kinked to measure appropriate catheter length. The micropuncture sheath was exchanged for a peel-away sheath over a guidewire. Initially, a 5 [place] dual lumen tunneled PICC measuring 28 cm was tunneled in a retrograde fashion from the anterior chest wall to the venotomy incision. Unfortunately, the initial catheter was noted to retract within the tortuous left innominate vein with with tip terminating at the level of the SVC. As such, the existing PICC line was cannulated with a GT Glidewire which was advanced to the level of the IVC. Under intermittent fluoroscopic guidance, the existing 28 cm tunneled PICC line was exchanged for a new slightly longer 31 cm dual lumen tunneled PICC line with tip ultimately terminating within the superior aspect the right atrium. Final catheter positioning was confirmed and documented with a spot radiographic image. The catheter aspirates and flushes normally. The catheter was flushed with appropriate volume heparin dwells. The catheter exit site was secured with a 0-Prolene retention suture. The venotomy incision was closed with Dermabond and Steri-strips. Dressings were applied. The patient tolerated the procedure well without immediate post procedural complication. FINDINGS: After catheter placement, the tip lies within the superior cavoatrial junction. The catheter aspirates and flushes normally and is ready for immediate use. IMPRESSION: Successful placement of 31 cm dual lumen tunneled PICC catheter via the left internal jugular vein with tip terminating at the superior caval atrial junction. The catheter is ready for immediate use. Electronically Signed   By: Simonne Come  M.D.   On: 01/25/2016 13:51   . amiodarone  100 mg Oral Daily  . atorvastatin  80 mg Oral QHS  . Calcifediol ER  30 mcg Oral QHS  . carvedilol  25 mg Oral BID WC  .  ceFAZolin (ANCEF) IV  1 g Intravenous Q24H  . cloNIDine  0.1 mg Oral BID  . hydrALAZINE  50 mg Oral TID  . insulin aspart  0-5 Units Subcutaneous QHS  . insulin aspart  0-9 Units Subcutaneous TID WC  . insulin aspart  3 Units Subcutaneous TID WC  . isosorbide mononitrate  120 mg Oral Daily  . multivitamin with minerals  1 tablet Oral q morning - 10a  . predniSONE  50 mg Oral Q breakfast  . rifampin  600 mg Oral Daily  . tamsulosin  0.4 mg Oral QPC supper    BMET    Component Value Date/Time   NA 131* 01/26/2016 0513   NA 146* 08/10/2013 1324   K 4.5 01/26/2016 0513   K 3.9 08/10/2013 1324   CL 102 01/26/2016 0513   CO2 20* 01/26/2016 0513   CO2 27 08/10/2013 1324   GLUCOSE 197* 01/26/2016 0513   GLUCOSE 105 08/10/2013 1324   BUN 123* 01/26/2016 0513   BUN 40.3* 08/10/2013 1324   CREATININE  5.71* 01/26/2016 0513   CREATININE 5.79* 11/07/2015 1440   CREATININE 2.6* 08/10/2013 1324   CALCIUM 8.2* 01/26/2016 0513   CALCIUM 9.4 08/10/2013 1324   GFRNONAA 9* 01/26/2016 0513   GFRAA 10* 01/26/2016 0513   CBC    Component Value Date/Time   WBC 8.6 01/26/2016 0513   WBC 3.3* 01/12/2016 1007   RBC 2.20* 01/26/2016 0513   RBC 2.80* 01/12/2016 1007   RBC 2.64* 03/17/2013 0534   HGB 7.0* 01/26/2016 0513   HGB 9.3* 01/12/2016 1007   HCT 20.5* 01/26/2016 0513   HCT 28.3* 01/12/2016 1007   PLT 251 01/26/2016 0513   PLT 88* 01/12/2016 1007   MCV 93.2 01/26/2016 0513   MCV 101.1* 01/12/2016 1007   MCH 31.8 01/26/2016 0513   MCH 33.2 01/12/2016 1007   MCHC 34.1 01/26/2016 0513   MCHC 32.9 01/12/2016 1007   RDW 17.3* 01/26/2016 0513   RDW 16.9* 01/12/2016 1007   LYMPHSABS 0.8 01/16/2016 1239   LYMPHSABS 0.9 01/12/2016 1007   MONOABS 1.4* 01/16/2016 1239   MONOABS 0.6 01/12/2016 1007   EOSABS 0.0 01/16/2016  1239   EOSABS 0.3 01/12/2016 1007   BASOSABS 0.0 01/16/2016 1239   BASOSABS 0.0 01/12/2016 1007     Assessment/Plan: 1. AKI/CKD- in very ill, elderly male with ongoing MSSA bacteremia and likely pacemaker wire involvement and possible endocarditis. DDx includes ischemic ATN in setting of severe illness, interstitial nephritis from abx, post-infectious GN, or progressive CKD given his advanced CKD at baseline. His bladder mass is also an issue, although no evidence of obstruction by [place]. Discussed this with mr. Carlisi and his family. He admits that he did not have positive feelings about hemodialysis before this hospitalization and is not currently interested at this time. He is markedly debilitated and agree with his decision not to pursue renal replacement therapy. Will start workup for AKI/CKD and continue with conservative therapy for now.  2. MSSA bacteremia with involvement of pacemaker wire in RA.  1. Need to renally adjust ancef given his eGFR <27m/min to 1 gram daily or 2 grams every MWF 2. TEE today without vegetation seen. 3. Bladder mass- s/p cysto and consistent with multiple large blood clots 1. Not able to be removed and per Dr. HLouis Meckelwas to have bladder irrigations but has not yet started. 4. Prostate cancer s/p xrt and lupron 5. Myelodysplastic syndrome/Anemia of chronic disease and acute illness. Was on procrit prior to admission. 1. Recommend transfusion of 2 units PRBC's each over 3 hours with lasix '40mg'$  IV between units 6. Hyponatremia - due to #1 as well as poor po intake. Will start IVF's and follow 7. CAD- cardiology following 8. 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 10. Metabolic acidosis- due to #1 11. Disposition- poor overall prognosis in an elderly, debilitated man with multiple chronic issues. He is not interested in HD and palliative care consult to help set goals/limits of care would be helpful.  JKoloa

## 2016-01-27 ENCOUNTER — Inpatient Hospital Stay (HOSPITAL_COMMUNITY): Payer: Medicare Other

## 2016-01-27 ENCOUNTER — Encounter (HOSPITAL_COMMUNITY): Payer: Self-pay | Admitting: Cardiology

## 2016-01-27 ENCOUNTER — Encounter (HOSPITAL_COMMUNITY)
Admission: EM | Disposition: A | Discharge status: Skilled Nursing Facility | Payer: Self-pay | Source: Home / Self Care | Attending: Internal Medicine

## 2016-01-27 ENCOUNTER — Inpatient Hospital Stay (HOSPITAL_COMMUNITY): Payer: Medicare Other | Admitting: Certified Registered"

## 2016-01-27 DIAGNOSIS — T82897A Other specified complication of cardiac prosthetic devices, implants and grafts, initial encounter: Secondary | ICD-10-CM

## 2016-01-27 DIAGNOSIS — T827XXA Infection and inflammatory reaction due to other cardiac and vascular devices, implants and grafts, initial encounter: Secondary | ICD-10-CM

## 2016-01-27 HISTORY — PX: PACEMAKER LEAD REMOVAL: SHX5064

## 2016-01-27 HISTORY — PX: FOREIGN BODY REMOVAL ABDOMINAL: SHX5319

## 2016-01-27 HISTORY — PX: TEE WITHOUT CARDIOVERSION: SHX5443

## 2016-01-27 LAB — POCT I-STAT 7, (LYTES, BLD GAS, ICA,H+H)
Acid-base deficit: 13 mmol/L — ABNORMAL HIGH (ref 0.0–2.0)
Acid-base deficit: 12 mmol/L — ABNORMAL HIGH (ref 0.0–2.0)
Bicarbonate: 14.1 mEq/L — ABNORMAL LOW (ref 20.0–24.0)
Bicarbonate: 13.5 mEq/L — ABNORMAL LOW (ref 20.0–24.0)
Calcium, Ion: 1.17 mmol/L (ref 1.12–1.23)
Calcium, Ion: 1.19 mmol/L (ref 1.12–1.23)
HCT: 20 % — ABNORMAL LOW (ref 39.0–52.0)
HCT: 25 % — ABNORMAL LOW (ref 39.0–52.0)
Hemoglobin: 6.8 g/dL — CL (ref 13.0–17.0)
Hemoglobin: 8.5 g/dL — ABNORMAL LOW (ref 13.0–17.0)
O2 Saturation: 100 %
O2 Saturation: 96 %
Patient temperature: 35.5
Patient temperature: 36
Potassium: 4.4 mmol/L (ref 3.5–5.1)
Potassium: 4.5 mmol/L (ref 3.5–5.1)
Sodium: 134 mmol/L — ABNORMAL LOW (ref 135–145)
Sodium: 136 mmol/L (ref 135–145)
TCO2: 15 mmol/L (ref 0–100)
TCO2: 14 mmol/L (ref 0–100)
pCO2 arterial: 34 mmHg — ABNORMAL LOW (ref 35.0–45.0)
pCO2 arterial: 27.6 mmHg — ABNORMAL LOW (ref 35.0–45.0)
pH, Arterial: 7.218 — ABNORMAL LOW (ref 7.350–7.450)
pH, Arterial: 7.293 — ABNORMAL LOW (ref 7.350–7.450)
pO2, Arterial: 212 mmHg — ABNORMAL HIGH (ref 80.0–100.0)
pO2, Arterial: 88 mmHg (ref 80.0–100.0)

## 2016-01-27 LAB — PROTEIN ELECTROPHORESIS, SERUM
A/G Ratio: 0.6 — ABNORMAL LOW (ref 0.7–1.7)
ALPHA-2-GLOBULIN: 1.2 g/dL — AB (ref 0.4–1.0)
Albumin ELP: 2.3 g/dL — ABNORMAL LOW (ref 2.9–4.4)
Alpha-1-Globulin: 0.3 g/dL (ref 0.0–0.4)
Beta Globulin: 1 g/dL (ref 0.7–1.3)
GAMMA GLOBULIN: 1.1 g/dL (ref 0.4–1.8)
GLOBULIN, TOTAL: 3.6 g/dL (ref 2.2–3.9)
M-SPIKE, %: 0.3 g/dL — AB
Total Protein ELP: 5.9 g/dL — ABNORMAL LOW (ref 6.0–8.5)

## 2016-01-27 LAB — ANTINUCLEAR ANTIBODIES, IFA: ANA Ab, IFA: NEGATIVE

## 2016-01-27 LAB — GLUCOSE, CAPILLARY
Glucose-Capillary: 262 mg/dL — ABNORMAL HIGH (ref 65–99)
Glucose-Capillary: 213 mg/dL — ABNORMAL HIGH (ref 65–99)
Glucose-Capillary: 170 mg/dL — ABNORMAL HIGH (ref 65–99)
Glucose-Capillary: 211 mg/dL — ABNORMAL HIGH (ref 65–99)
Glucose-Capillary: 178 mg/dL — ABNORMAL HIGH (ref 65–99)

## 2016-01-27 LAB — CBC
HEMATOCRIT: 27 % — AB (ref 39.0–52.0)
Hemoglobin: 9.1 g/dL — ABNORMAL LOW (ref 13.0–17.0)
MCH: 31.2 pg (ref 26.0–34.0)
MCHC: 33.7 g/dL (ref 30.0–36.0)
MCV: 92.5 fL (ref 78.0–100.0)
Platelets: 307 10*3/uL (ref 150–400)
RBC: 2.92 MIL/uL — ABNORMAL LOW (ref 4.22–5.81)
RDW: 18.9 % — AB (ref 11.5–15.5)
WBC: 14.1 10*3/uL — ABNORMAL HIGH (ref 4.0–10.5)

## 2016-01-27 LAB — RENAL FUNCTION PANEL
Albumin: 2.1 g/dL — ABNORMAL LOW (ref 3.5–5.0)
Anion gap: 9 (ref 5–15)
BUN: 126 mg/dL — ABNORMAL HIGH (ref 6–20)
CO2: 18 mmol/L — ABNORMAL LOW (ref 22–32)
Calcium: 8.7 mg/dL — ABNORMAL LOW (ref 8.9–10.3)
Chloride: 106 mmol/L (ref 101–111)
Creatinine, Ser: 5.92 mg/dL — ABNORMAL HIGH (ref 0.61–1.24)
GFR calc Af Amer: 9 mL/min — ABNORMAL LOW (ref >60)
GFR calc non Af Amer: 8 mL/min — ABNORMAL LOW (ref >60)
Glucose, Bld: 179 mg/dL — ABNORMAL HIGH (ref 65–99)
Phosphorus: 4.7 mg/dL — ABNORMAL HIGH (ref 2.5–4.6)
Potassium: 4.2 mmol/L (ref 3.5–5.1)
Sodium: 133 mmol/L — ABNORMAL LOW (ref 135–145)

## 2016-01-27 LAB — COMPLEMENT, TOTAL: COMPL TOTAL (CH50): 59 U/mL (ref 42–60)

## 2016-01-27 LAB — SURGICAL PCR SCREEN
MRSA, PCR: NEGATIVE
Staphylococcus aureus: POSITIVE — AB

## 2016-01-27 SURGERY — REMOVAL, ELECTRODE LEAD, CARDIAC PACEMAKER, WITHOUT REPLACEMENT
Anesthesia: General | Site: Groin | Laterality: Right

## 2016-01-27 MED ORDER — OXYCODONE HCL 5 MG PO TABS
5.0000 mg | ORAL_TABLET | Freq: Once | ORAL | Status: DC | PRN
Start: 2016-01-27 — End: 2016-01-27

## 2016-01-27 MED ORDER — PHENYLEPHRINE HCL 10 MG/ML IJ SOLN
INTRAMUSCULAR | Status: DC | PRN
  Administered 2016-01-27 (×4): 80 ug via INTRAVENOUS

## 2016-01-27 MED ORDER — MIDAZOLAM HCL 5 MG/5ML IJ SOLN
INTRAMUSCULAR | Status: DC | PRN
  Administered 2016-01-27: 2 mg via INTRAVENOUS

## 2016-01-27 MED ORDER — GENTAMICIN SULFATE 40 MG/ML IJ SOLN
INTRAMUSCULAR | Status: AC
  Filled 2016-01-27: qty 2

## 2016-01-27 MED ORDER — VASOPRESSIN 20 UNIT/ML IV SOLN
INTRAVENOUS | Status: DC | PRN
  Administered 2016-01-27 (×7): 1 [IU] via INTRAVENOUS
  Administered 2016-01-27: 2 [IU] via INTRAVENOUS
  Administered 2016-01-27 (×2): 1 [IU] via INTRAVENOUS

## 2016-01-27 MED ORDER — ACETAMINOPHEN 325 MG PO TABS
325.0000 mg | ORAL_TABLET | ORAL | Status: DC | PRN
  Administered 2016-01-28: 325 mg via ORAL
  Administered 2016-01-29 (×2): 650 mg via ORAL

## 2016-01-27 MED ORDER — FENTANYL CITRATE (PF) 250 MCG/5ML IJ SOLN
INTRAMUSCULAR | Status: AC
  Filled 2016-01-27: qty 5

## 2016-01-27 MED ORDER — CISATRACURIUM BESYLATE 20 MG/10ML IV SOLN
INTRAVENOUS | Status: AC
  Filled 2016-01-27: qty 10

## 2016-01-27 MED ORDER — ONDANSETRON HCL 4 MG/2ML IJ SOLN
4.0000 mg | Freq: Four times a day (QID) | INTRAMUSCULAR | Status: DC | PRN
  Administered 2016-02-02: 4 mg via INTRAVENOUS
  Filled 2016-01-27: qty 2

## 2016-01-27 MED ORDER — 0.9 % SODIUM CHLORIDE (POUR BTL) OPTIME
TOPICAL | Status: DC | PRN
  Administered 2016-01-27: 1000 mL

## 2016-01-27 MED ORDER — SODIUM CHLORIDE 0.9 % IR SOLN
Status: DC | PRN
  Administered 2016-01-27: 500 mL

## 2016-01-27 MED ORDER — VASOPRESSIN 20 UNIT/ML IV SOLN
INTRAVENOUS | Status: AC
  Filled 2016-01-27: qty 1

## 2016-01-27 MED ORDER — LIDOCAINE 2% (20 MG/ML) 5 ML SYRINGE
INTRAMUSCULAR | Status: AC
  Filled 2016-01-27: qty 5

## 2016-01-27 MED ORDER — DEXTROSE 5 % IV SOLN
10.0000 mg | INTRAVENOUS | Status: DC | PRN
  Administered 2016-01-27: 100 ug/min via INTRAVENOUS

## 2016-01-27 MED ORDER — SODIUM CHLORIDE 0.9 % IV SOLN
INTRAVENOUS | Status: DC | PRN
  Administered 2016-01-27 (×2): 500 mL

## 2016-01-27 MED ORDER — ACETAMINOPHEN 325 MG PO TABS
325.0000 mg | ORAL_TABLET | ORAL | Status: DC | PRN
  Filled 2016-01-27: qty 1
  Filled 2016-01-27 (×2): qty 2

## 2016-01-27 MED ORDER — POLYETHYLENE GLYCOL 3350 17 G PO PACK
17.0000 g | PACK | Freq: Every day | ORAL | Status: DC
  Administered 2016-01-28 – 2016-01-30 (×3): 17 g via ORAL
  Filled 2016-01-27 (×3): qty 1

## 2016-01-27 MED ORDER — PROPOFOL 10 MG/ML IV BOLUS
INTRAVENOUS | Status: DC | PRN
  Administered 2016-01-27: 100 mg via INTRAVENOUS

## 2016-01-27 MED ORDER — FENTANYL CITRATE (PF) 250 MCG/5ML IJ SOLN
INTRAMUSCULAR | Status: DC | PRN
  Administered 2016-01-27: 100 ug via INTRAVENOUS
  Administered 2016-01-27: 50 ug via INTRAVENOUS

## 2016-01-27 MED ORDER — OXYCODONE HCL 5 MG/5ML PO SOLN: 5.0000 mg | Freq: Once | ORAL | Status: DC | PRN

## 2016-01-27 MED ORDER — SODIUM CHLORIDE 0.9 % IR SOLN: 80.0000 mg | Status: DC

## 2016-01-27 MED ORDER — ALBUMIN HUMAN 5 % IV SOLN
INTRAVENOUS | Status: DC | PRN
  Administered 2016-01-27 (×2): via INTRAVENOUS

## 2016-01-27 MED ORDER — CHLORHEXIDINE GLUCONATE 4 % EX LIQD
60.0000 mL | Freq: Once | CUTANEOUS | Status: AC
  Administered 2016-01-27: 4 via TOPICAL
  Filled 2016-01-27: qty 60

## 2016-01-27 MED ORDER — SODIUM CHLORIDE 0.9 % IR SOLN
Status: DC
  Filled 2016-01-27: qty 2

## 2016-01-27 MED ORDER — CEFAZOLIN IN D5W 1 GM/50ML IV SOLN
1.0000 g | Freq: Four times a day (QID) | INTRAVENOUS | Status: AC
  Administered 2016-01-27 – 2016-01-28 (×3): 1 g via INTRAVENOUS
  Filled 2016-01-27 (×3): qty 50

## 2016-01-27 MED ORDER — ONDANSETRON HCL 4 MG/2ML IJ SOLN: 4.0000 mg | Freq: Once | INTRAMUSCULAR | Status: DC | PRN

## 2016-01-27 MED ORDER — FENTANYL CITRATE (PF) 100 MCG/2ML IJ SOLN: 25.0000 ug | INTRAMUSCULAR | Status: DC | PRN

## 2016-01-27 MED ORDER — CEFAZOLIN SODIUM-DEXTROSE 2-4 GM/100ML-% IV SOLN
2.0000 g | INTRAVENOUS | Status: AC
  Administered 2016-01-27: 2 g via INTRAVENOUS

## 2016-01-27 MED ORDER — CHLORHEXIDINE GLUCONATE 4 % EX LIQD
CUTANEOUS | Status: AC
  Administered 2016-01-27: 4 via TOPICAL
  Filled 2016-01-27: qty 15

## 2016-01-27 MED ORDER — INSULIN ASPART 100 UNIT/ML ~~LOC~~ SOLN
6.0000 [IU] | Freq: Three times a day (TID) | SUBCUTANEOUS | Status: DC
  Administered 2016-01-29 – 2016-02-03 (×9): 6 [IU] via SUBCUTANEOUS
  Filled 2016-01-27 (×24): qty 0.06

## 2016-01-27 MED ORDER — SODIUM CHLORIDE 0.9 % IV SOLN
INTRAVENOUS | Status: DC
  Administered 2016-01-27 (×2): via INTRAVENOUS

## 2016-01-27 MED ORDER — ONDANSETRON HCL 4 MG/2ML IJ SOLN: 4.0000 mg | Freq: Four times a day (QID) | INTRAMUSCULAR | Status: DC | PRN

## 2016-01-27 MED ORDER — CEFAZOLIN SODIUM 1 G IJ SOLR
INTRAMUSCULAR | Status: AC
  Filled 2016-01-27: qty 20

## 2016-01-27 MED ORDER — MIDAZOLAM HCL 2 MG/2ML IJ SOLN
INTRAMUSCULAR | Status: AC
  Filled 2016-01-27: qty 2

## 2016-01-27 MED ORDER — LIDOCAINE HCL (PF) 1 % IJ SOLN
INTRAMUSCULAR | Status: AC
  Filled 2016-01-27: qty 30

## 2016-01-27 MED ORDER — LIDOCAINE HCL (PF) 1 % IJ SOLN
INTRAMUSCULAR | Status: DC | PRN
  Administered 2016-01-27: 23 mL

## 2016-01-27 MED ORDER — CHLORHEXIDINE GLUCONATE 4 % EX LIQD
60.0000 mL | Freq: Once | CUTANEOUS | Status: AC
  Administered 2016-01-27: 4 via TOPICAL

## 2016-01-27 MED ORDER — CISATRACURIUM BESYLATE (PF) 10 MG/5ML IV SOLN
INTRAVENOUS | Status: DC | PRN
  Administered 2016-01-27: 12 mg via INTRAVENOUS

## 2016-01-27 MED ORDER — PROPOFOL 10 MG/ML IV BOLUS
INTRAVENOUS | Status: AC
  Filled 2016-01-27: qty 20

## 2016-01-27 SURGICAL SUPPLY — 68 items
BAG BANDED W/RUBBER/TAPE 36X54 (MISCELLANEOUS) ×10 IMPLANT
BAG EQP BAND 135X91 W/RBR TAPE (MISCELLANEOUS) ×2
BLADE 10 SAFETY STRL DISP (BLADE) ×5 IMPLANT
BLADE OSCILLATING /SAGITTAL (BLADE) IMPLANT
BLADE STERNUM SYSTEM 6 (BLADE) ×5 IMPLANT
BNDG ADH 5X4 AIR PERM ELC (GAUZE/BANDAGES/DRESSINGS)
BNDG COHESIVE 4X5 WHT NS (GAUZE/BANDAGES/DRESSINGS) IMPLANT
BOOT SUTURE AID YELLOW STND (SUTURE) ×5 IMPLANT
CANISTER SUCTION 2500CC (MISCELLANEOUS) ×5 IMPLANT
CATH EMB 5FR 80CM (CATHETERS) ×5 IMPLANT
CATH EMB 6FR 80CM (CATHETERS) ×5 IMPLANT
CLIP TI MEDIUM 6 (CLIP) ×5 IMPLANT
CLIP TI WIDE RED SMALL 6 (CLIP) ×5 IMPLANT
COIL ONE TIE COMPRESSION (MISCELLANEOUS) ×5 IMPLANT
COVER DOME SNAP 22 D (MISCELLANEOUS) ×5 IMPLANT
COVER SURGICAL LIGHT HANDLE (MISCELLANEOUS) ×5 IMPLANT
COVER TABLE BACK 60X90 (DRAPES) ×5 IMPLANT
DEVICE LCKNG LEAD CARDIAC (CATHETERS) ×6 IMPLANT
DRAPE CARDIOVASCULAR INCISE (DRAPES) ×3
DRAPE SRG 135X102X78XABS (DRAPES) ×3 IMPLANT
DRSG COVADERM 4X6 (GAUZE/BANDAGES/DRESSINGS) ×5 IMPLANT
DRSG OPSITE 6X11 MED (GAUZE/BANDAGES/DRESSINGS) IMPLANT
DRSG TEGADERM 2-3/8X2-3/4 SM (GAUZE/BANDAGES/DRESSINGS) ×5 IMPLANT
ELECT REM PT RETURN 9FT ADLT (ELECTROSURGICAL) ×10
ELECTRODE REM PT RTRN 9FT ADLT (ELECTROSURGICAL) ×6 IMPLANT
GAUZE SPONGE 2X2 8PLY STRL LF (GAUZE/BANDAGES/DRESSINGS) ×3 IMPLANT
GAUZE SPONGE 4X4 12PLY STRL (GAUZE/BANDAGES/DRESSINGS) IMPLANT
GAUZE SPONGE 4X4 16PLY XRAY LF (GAUZE/BANDAGES/DRESSINGS) IMPLANT
GLOVE BIO SURGEON STRL SZ 6.5 (GLOVE) ×8 IMPLANT
GLOVE BIO SURGEONS STRL SZ 6.5 (GLOVE) ×2
GLOVE BIOGEL PI IND STRL 7.5 (GLOVE) ×3 IMPLANT
GLOVE BIOGEL PI INDICATOR 7.5 (GLOVE) ×2
GLOVE ECLIPSE 8.0 STRL XLNG CF (GLOVE) ×5 IMPLANT
GOWN STRL REUS W/ TWL LRG LVL3 (GOWN DISPOSABLE) IMPLANT
GOWN STRL REUS W/ TWL XL LVL3 (GOWN DISPOSABLE) ×3 IMPLANT
GOWN STRL REUS W/TWL LRG LVL3 (GOWN DISPOSABLE)
GOWN STRL REUS W/TWL XL LVL3 (GOWN DISPOSABLE) ×2
KIT ROOM TURNOVER OR (KITS) ×5 IMPLANT
LIQUID BAND (GAUZE/BANDAGES/DRESSINGS) ×5 IMPLANT
LOOP VESSEL MAXI BLUE (MISCELLANEOUS) ×10 IMPLANT
LOOP VESSEL MINI RED (MISCELLANEOUS) ×5 IMPLANT
NEEDLE PERC 18GX7CM (NEEDLE) ×5 IMPLANT
PAD ARMBOARD 7.5X6 YLW CONV (MISCELLANEOUS) ×10 IMPLANT
PAD ELECT DEFIB RADIOL ZOLL (MISCELLANEOUS) ×5 IMPLANT
REMOVAL LLD CARDIAC LEAD EZ (CATHETERS) ×10
SHEATH CRV INNER FEMORAL 12FR (SHEATH) ×5 IMPLANT
SHEATH EVOLUTION RL 11F (SHEATH) ×5 IMPLANT
SHEATH PINNACLE 6F 10CM (SHEATH) ×10 IMPLANT
SHEATH SET CRV FEMORAL 16FR (SHEATH) ×5 IMPLANT
SHEATH TIGHTRAIL MINI 11F (SHEATH) ×5 IMPLANT
SNARE NEEDLE EYE 20MM W/SHTH (CATHETERS) ×5 IMPLANT
SPONGE GAUZE 2X2 STER 10/PKG (GAUZE/BANDAGES/DRESSINGS) ×2
SPONGE LAP 18X18 X RAY DECT (DISPOSABLE) ×5 IMPLANT
SUT PROLENE 2 0 SH DA (SUTURE) IMPLANT
SUT PROLENE 3 0 PS 2 (SUTURE) ×10 IMPLANT
SUT PROLENE 5 0 C 1 24 (SUTURE) ×10 IMPLANT
SUT SILK 3 0 (SUTURE) ×3
SUT SILK 3-0 18XBRD TIE 12 (SUTURE) ×3 IMPLANT
SUT VIC AB 3-0 SH 27 (SUTURE) ×6
SUT VIC AB 3-0 SH 27X BRD (SUTURE) ×6 IMPLANT
SYR 3ML LL SCALE MARK (SYRINGE) ×10 IMPLANT
TOWEL OR 17X24 6PK STRL BLUE (TOWEL DISPOSABLE) ×10 IMPLANT
TOWEL OR 17X26 10 PK STRL BLUE (TOWEL DISPOSABLE) ×10 IMPLANT
TRAY FOLEY IC TEMP SENS 16FR (CATHETERS) IMPLANT
TUBE CONNECTING 12'X1/4 (SUCTIONS)
TUBE CONNECTING 12X1/4 (SUCTIONS) IMPLANT
TUBING EXTENTION W/L.L. (IV SETS) ×5 IMPLANT
YANKAUER SUCT BULB TIP NO VENT (SUCTIONS) IMPLANT

## 2016-01-27 NOTE — Care Management Important Message (Signed)
Important Message  Patient Details  Name: [name] MANGAT Sr. MRN: [id number] Date of Birth: 1935-09-25   Medicare Important Message Given:  Yes    Loann Quill 01/27/2016, 7:58 AM

## 2016-01-27 NOTE — Transfer of Care (Signed)
Immediate Anesthesia Transfer of Care Note  Patient: [name] Marble Sr.  Procedure(s) Performed: Procedure(s) with comments: PACEMAKER EXTRACTION (Right) - DR. OWEN TO BACKUP CASE TRANSESOPHAGEAL ECHOCARDIOGRAM (TEE) (N/A) EXPOSURE OF RIGHT COMMON FEMORAL ARTERY AND REMOVAL FOREIGN (Right)  Patient Location: PACU  Anesthesia Type:General  Level of Consciousness: awake, alert  and oriented  Airway & Oxygen Therapy: Patient Spontanous Breathing and Patient connected to nasal cannula oxygen  Post-op Assessment: Report given to RN and Post -op Vital signs reviewed and stable  Post vital signs: Reviewed and stable  Last Vitals:  Filed Vitals:   01/27/16 1124 01/27/16 1956  BP: 150/69   Pulse: 78   Temp: 37.1 C 36.6 C  Resp: 18     Last Pain:  Filed Vitals:   01/27/16 2002  PainSc: Asleep      Patients Stated Pain Goal: 2 ([id number])  Complications: No apparent anesthesia complications

## 2016-01-27 NOTE — Care Management Note (Signed)
Case Management Note  Patient Details  Name: [name]. MRN: [id number] Date of Birth: 05-26-1936  Subjective/Objective:      Sepsis, gross hematuria              Action/Plan: Discharge Planning: Chart reviewed. Lives at home with wife, [name]. Scheduled dc to SNF when medically stable. CSW following for SNF placement.    Expected Discharge Date:                Expected Discharge Plan:  Skilled Nursing Facility  In-House Referral:  Clinical Social Work  Discharge planning Services  CM Consult  Post Acute Care Choice:  NA Choice offered to:  NA  DME Arranged:  N/A DME Agency:  NA  HH Arranged:  NA HH Agency:  NA  Status of Service:  Completed, signed off  If discussed at [place] of Stay Meetings, dates discussed:    Additional Comments:  Erenest Rasher, RN 01/27/2016, 4:02 PM

## 2016-01-27 NOTE — Clinical Documentation Improvement (Signed)
Hospitalist  Based on the clinical indicators below please clarify if any of the following conditions is clinically supported.    Sepsis   Other  Clinically Undetermined   Supporting Information: Sent in by PCP to have work up for sepsis. Initial BP 70/40, TEMP 100; WBC 13.1; Lactic acid 2.43; blood culture growing staph aureus.  Dx of bacteremia is all that is listed in medical record.  Resending query from 7/10.   Please exercise your independent, professional judgment when responding. A specific answer is not anticipated or expected.   Thank You,  [place] [phone]

## 2016-01-27 NOTE — Progress Notes (Signed)
Palliative medicine team consult received.    Attempted to see patient, however he currently off of the floor. Will plan to f/u with him later this afternoon/tomorrow.   If there are urgent needs or questions please call [phone]. Thank you for consulting out team to assist with this patients care.  Micheline Rough, MD [place] Team [phone]

## 2016-01-27 NOTE — Progress Notes (Signed)
Patient ID: [name] Brow., male   DOB: February 26, 1936, 80 y.o.   MRN: [id number] S:started on continuous bladder irrigation last night O:BP 170/73 mmHg  Pulse 87  Temp(Src) 97.8 F (36.6 C) (Oral)  Resp 18  Ht '5\' 10"'$  (1.778 m)  Wt 72.394 kg (159 lb 9.6 oz)  BMI 22.90 kg/m2  SpO2 97%  Intake/Output Summary (Last 24 hours) at 01/27/16 0806 Last data filed at 01/27/16 0520  Gross per 24 hour  Intake   1905 ml  Output  11051 ml  Net  -9146 ml   Intake/Output: I/O last 3 completed shifts: In: 2005 [P.O.:720; I.V.:850; Blood:335; IV Piggyback:100] Out: 53976 [Urine:11300; Drains:100; Stool:2]  Intake/Output this shift:    Weight change:  BHA:LPFXT elderly AAM in NAD CVS:no rub Resp:cta KWI:OXBDZH Ext:tr edema   Recent Labs Lab 01/21/16 0314 01/22/16 0523 01/23/16 0239 01/24/16 0317 01/25/16 0311 01/26/16 0513  NA 132* 131* 128* 131* 130* 131*  K 4.3 3.9 3.7 4.2 4.3 4.5  CL 100* 101 98* 102 101 102  CO2 24 20* 20* 20* 20* 20*  GLUCOSE 128* 94 127* 192* 154* 197*  BUN 71* 82* 83* 100* 114* 123*  CREATININE 3.06* 3.51* 3.69* 4.20* 5.12* 5.71*  ALBUMIN  --   --   --   --   --  2.0*  CALCIUM 9.0 8.8* 8.5* 8.7* 8.6* 8.2*  PHOS  --   --   --   --   --  4.1   Liver Function Tests:  Recent Labs Lab 01/26/16 0513  ALBUMIN 2.0*   No results for input(s): LIPASE, AMYLASE in the last 168 hours. No results for input(s): AMMONIA in the last 168 hours. CBC:  Recent Labs Lab 01/23/16 0239 01/24/16 0317 01/25/16 0311 01/26/16 0513  WBC 9.6 10.0 8.6 8.6  HGB 7.9* 8.1* 7.3* 7.0*  HCT 22.9* 23.1* 21.3* 20.5*  MCV 92.3 91.7 92.6 93.2  PLT 169 212 238 251   Cardiac Enzymes:  Recent Labs Lab 01/25/16 1519  CKTOTAL 60   CBG:  Recent Labs Lab 01/26/16 1158 01/26/16 1657 01/26/16 2143 01/27/16 0129 01/27/16 0607  GLUCAP 192* 260* 246* 262* 213*    Iron Studies: No results for input(s): IRON, TIBC, TRANSFERRIN, FERRITIN in the last 72  hours. Studies/Results: Ir Fluoro Guide Cv Line Left  01/25/2016  INDICATION: Bacteremia due to Staph aureus infection. History of chronic renal insufficiency. Request made for placement of a tunneled PICC line for antibiotic administration. EXAM: TUNNELED PICC LINE WITH ULTRASOUND AND FLUOROSCOPIC GUIDANCE MEDICATIONS: The patient is currently admitted to the hospital and receiving intravenous antibiotics. The antibiotic was given in an appropriate time interval prior to skin puncture. ANESTHESIA/SEDATION: Moderate (conscious) sedation was employed during this procedure. A total of Versed 0.5 mg and Fentanyl 25 mcg was administered intravenously. Moderate Sedation Time: 33 minutes. The patient's level of consciousness and vital signs were monitored continuously by radiology nursing throughout the procedure under my direct supervision. COMPLICATIONS: None immediate. PROCEDURE: Informed written consent was obtained from the patient after a discussion of the risks, benefits, and alternatives to treatment. Questions regarding the procedure were encouraged and answered. Given the presence of the right anterior chest wall pacemaker, the decision was made to place a left internal jugular approach dual lumen PICC line. As such, the left neck and chest were prepped with chlorhexidine in a sterile fashion, and a sterile drape was applied covering the operative field. Maximum barrier sterile technique with sterile gowns and gloves were used  for the procedure. A timeout was performed prior to the initiation of the procedure. After creating a small venotomy incision, a micropuncture kit was utilized to access the left internal jugular vein under direct, real-time ultrasound guidance after the overlying soft tissues were anesthetized with 1% lidocaine with epinephrine. Ultrasound image documentation was performed. Despite several attempts, there was difficulty advancing the micro wire centrally. As such, a limited central  venogram was performed via the inner 3 French sheath from the micropuncture kit demonstrating patency though marked tortuosity of the central aspect of the left internal jugular vein. Ultimately, a Nitrex wire was able to be advanced centrally. The microwire was kinked to measure appropriate catheter length. The micropuncture sheath was exchanged for a peel-away sheath over a guidewire. Initially, a 5 [place] dual lumen tunneled PICC measuring 28 cm was tunneled in a retrograde fashion from the anterior chest wall to the venotomy incision. Unfortunately, the initial catheter was noted to retract within the tortuous left innominate vein with with tip terminating at the level of the SVC. As such, the existing PICC line was cannulated with a GT Glidewire which was advanced to the level of the IVC. Under intermittent fluoroscopic guidance, the existing 28 cm tunneled PICC line was exchanged for a new slightly longer 31 cm dual lumen tunneled PICC line with tip ultimately terminating within the superior aspect the right atrium. Final catheter positioning was confirmed and documented with a spot radiographic image. The catheter aspirates and flushes normally. The catheter was flushed with appropriate volume heparin dwells. The catheter exit site was secured with a 0-Prolene retention suture. The venotomy incision was closed with Dermabond and Steri-strips. Dressings were applied. The patient tolerated the procedure well without immediate post procedural complication. FINDINGS: After catheter placement, the tip lies within the superior cavoatrial junction. The catheter aspirates and flushes normally and is ready for immediate use. IMPRESSION: Successful placement of 31 cm dual lumen tunneled PICC catheter via the left internal jugular vein with tip terminating at the superior caval atrial junction. The catheter is ready for immediate use. Electronically Signed   By: Sandi Mariscal M.D.   On: 01/25/2016 13:51   Ir US Guide  Vasc Access Left  01/25/2016  INDICATION: Bacteremia due to Staph aureus infection. History of chronic renal insufficiency. Request made for placement of a tunneled PICC line for antibiotic administration. EXAM: TUNNELED PICC LINE WITH ULTRASOUND AND FLUOROSCOPIC GUIDANCE MEDICATIONS: The patient is currently admitted to the hospital and receiving intravenous antibiotics. The antibiotic was given in an appropriate time interval prior to skin puncture. ANESTHESIA/SEDATION: Moderate (conscious) sedation was employed during this procedure. A total of Versed 0.5 mg and Fentanyl 25 mcg was administered intravenously. Moderate Sedation Time: 33 minutes. The patient's level of consciousness and vital signs were monitored continuously by radiology nursing throughout the procedure under my direct supervision. COMPLICATIONS: None immediate. PROCEDURE: Informed written consent was obtained from the patient after a discussion of the risks, benefits, and alternatives to treatment. Questions regarding the procedure were encouraged and answered. Given the presence of the right anterior chest wall pacemaker, the decision was made to place a left internal jugular approach dual lumen PICC line. As such, the left neck and chest were prepped with chlorhexidine in a sterile fashion, and a sterile drape was applied covering the operative field. Maximum barrier sterile technique with sterile gowns and gloves were used for the procedure. A timeout was performed prior to the initiation of the procedure. After creating a small venotomy incision,  a micropuncture kit was utilized to access the left internal jugular vein under direct, real-time ultrasound guidance after the overlying soft tissues were anesthetized with 1% lidocaine with epinephrine. Ultrasound image documentation was performed. Despite several attempts, there was difficulty advancing the micro wire centrally. As such, a limited central venogram was performed via the inner 3  French sheath from the micropuncture kit demonstrating patency though marked tortuosity of the central aspect of the left internal jugular vein. Ultimately, a Nitrex wire was able to be advanced centrally. The microwire was kinked to measure appropriate catheter length. The micropuncture sheath was exchanged for a peel-away sheath over a guidewire. Initially, a 5 [place] dual lumen tunneled PICC measuring 28 cm was tunneled in a retrograde fashion from the anterior chest wall to the venotomy incision. Unfortunately, the initial catheter was noted to retract within the tortuous left innominate vein with with tip terminating at the level of the SVC. As such, the existing PICC line was cannulated with a GT Glidewire which was advanced to the level of the IVC. Under intermittent fluoroscopic guidance, the existing 28 cm tunneled PICC line was exchanged for a new slightly longer 31 cm dual lumen tunneled PICC line with tip ultimately terminating within the superior aspect the right atrium. Final catheter positioning was confirmed and documented with a spot radiographic image. The catheter aspirates and flushes normally. The catheter was flushed with appropriate volume heparin dwells. The catheter exit site was secured with a 0-Prolene retention suture. The venotomy incision was closed with Dermabond and Steri-strips. Dressings were applied. The patient tolerated the procedure well without immediate post procedural complication. FINDINGS: After catheter placement, the tip lies within the superior cavoatrial junction. The catheter aspirates and flushes normally and is ready for immediate use. IMPRESSION: Successful placement of 31 cm dual lumen tunneled PICC catheter via the left internal jugular vein with tip terminating at the superior caval atrial junction. The catheter is ready for immediate use. Electronically Signed   By: Sandi Mariscal M.D.   On: 01/25/2016 13:51   . sodium chloride   Intravenous Once  . amiodarone   100 mg Oral Daily  . atorvastatin  80 mg Oral QHS  . Calcifediol ER  30 mcg Oral QHS  . carvedilol  25 mg Oral BID WC  .  ceFAZolin (ANCEF) IV  1 g Intravenous Q24H  .  ceFAZolin (ANCEF) IV  2 g Intravenous On Call  . chlorhexidine  60 mL Topical Once  . cloNIDine  0.1 mg Oral BID  . gentamicin irrigation  80 mg Irrigation On Call  . hydrALAZINE  50 mg Oral TID  . insulin aspart  0-5 Units Subcutaneous QHS  . insulin aspart  0-9 Units Subcutaneous TID WC  . insulin aspart  3 Units Subcutaneous TID WC  . isosorbide mononitrate  120 mg Oral Daily  . multivitamin with minerals  1 tablet Oral q morning - 10a  . predniSONE  50 mg Oral Q breakfast  . rifampin  600 mg Oral Daily  . tamsulosin  0.4 mg Oral QPC supper    BMET    Component Value Date/Time   NA 131* 01/26/2016 0513   NA 146* 08/10/2013 1324   K 4.5 01/26/2016 0513   K 3.9 08/10/2013 1324   CL 102 01/26/2016 0513   CO2 20* 01/26/2016 0513   CO2 27 08/10/2013 1324   GLUCOSE 197* 01/26/2016 0513   GLUCOSE 105 08/10/2013 1324   BUN 123* 01/26/2016 0513   BUN  40.3* 08/10/2013 1324   CREATININE 5.71* 01/26/2016 0513   CREATININE 5.79* 11/07/2015 1440   CREATININE 2.6* 08/10/2013 1324   CALCIUM 8.2* 01/26/2016 0513   CALCIUM 9.4 08/10/2013 1324   GFRNONAA 9* 01/26/2016 0513   GFRAA 10* 01/26/2016 0513   CBC    Component Value Date/Time   WBC 8.6 01/26/2016 0513   WBC 3.3* 01/12/2016 1007   RBC 2.20* 01/26/2016 0513   RBC 2.80* 01/12/2016 1007   RBC 2.64* 03/17/2013 0534   HGB 7.0* 01/26/2016 0513   HGB 9.3* 01/12/2016 1007   HCT 20.5* 01/26/2016 0513   HCT 28.3* 01/12/2016 1007   PLT 251 01/26/2016 0513   PLT 88* 01/12/2016 1007   MCV 93.2 01/26/2016 0513   MCV 101.1* 01/12/2016 1007   MCH 31.8 01/26/2016 0513   MCH 33.2 01/12/2016 1007   MCHC 34.1 01/26/2016 0513   MCHC 32.9 01/12/2016 1007   RDW 17.3* 01/26/2016 0513   RDW 16.9* 01/12/2016 1007   LYMPHSABS 0.8 01/16/2016 1239   LYMPHSABS 0.9  01/12/2016 1007   MONOABS 1.4* 01/16/2016 1239   MONOABS 0.6 01/12/2016 1007   EOSABS 0.0 01/16/2016 1239   EOSABS 0.3 01/12/2016 1007   BASOSABS 0.0 01/16/2016 1239   BASOSABS 0.0 01/12/2016 1007     Assessment/Plan: 1. AKI/CKD- in very ill, elderly male with ongoing MSSA bacteremia and likely pacemaker wire involvement and possible endocarditis. DDx includes ischemic ATN in setting of severe illness, interstitial nephritis from abx, post-infectious GN, or progressive CKD given his advanced CKD at baseline. His bladder mass is also an issue, although no evidence of obstruction by [place]. Discussed this with mr. Osorto and his family. He admits that he did not have positive feelings about hemodialysis before this hospitalization and is not currently interested at this time.  1. He is markedly debilitated and agree with his decision not to pursue renal replacement therapy.  2. workup for AKI/CKD thus far most consistent with pre-renal causes (low FeNa, normal complements, negative ASO) and continue with conservative therapy for now.  3. Consult palliative care to help set goals/limits of care. 2. MSSA bacteremia with involvement of pacemaker wire in RA.  1. Changed dose of ancef for advanced CKD (his eGFR <76m/min) to 1 gram daily (which may have contributed to his AKI/CKD) 2. TEE without vegetations  3. Bladder mass- s/p cysto and consistent with multiple large blood clots 1. Not able to be removed and per Dr. HLouis Meckel 2. Continuous bladder irrigations started last night. 4. Prostate cancer s/p xrt and lupron 5. Myelodysplastic syndrome/Anemia of chronic disease and acute illness. Was on procrit prior to admission. 1. Recommend transfusion of 2 units PRBC's each over 3 hours with lasix '40mg'$  IV between units 6. Hyponatremia - due to #1 as well as poor po intake. Will start IVF's and follow 7. CAD- cardiology following 8. DM- per primary svc 9. 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 10. Metabolic acidosis- due to #1 11. Disposition- poor overall prognosis in an elderly, debilitated man with multiple chronic issues. He is not interested in HD and palliative care consult to help set goals/limits of care would be helpful.  [place]

## 2016-01-27 NOTE — Anesthesia Postprocedure Evaluation (Signed)
Anesthesia Post Note  Patient: [name] SAHAGIAN Sr.  Procedure(s) Performed: Procedure(s) (LRB): PACEMAKER EXTRACTION (Right) TRANSESOPHAGEAL ECHOCARDIOGRAM (TEE) (N/A) EXPOSURE OF RIGHT COMMON FEMORAL ARTERY AND REMOVAL FOREIGN (Right)  Patient location during evaluation: PACU Anesthesia Type: General Level of consciousness: awake Pain management: pain level controlled Vital Signs Assessment: post-procedure vital signs reviewed and stable Respiratory status: spontaneous breathing Cardiovascular status: stable Anesthetic complications: no    Last Vitals:  Filed Vitals:   01/27/16 2110 01/27/16 2111  BP:    Pulse:  89  Temp: 36.6 C   Resp:  15    Last Pain:  Filed Vitals:   01/27/16 2114  PainSc: 0-No pain                 EDWARDS,Sonny Poth

## 2016-01-27 NOTE — Progress Notes (Signed)
  Echocardiogram Echocardiogram Transesophageal has been performed.  [name] M 01/27/2016, 3:25 PM

## 2016-01-27 NOTE — Anesthesia Procedure Notes (Addendum)
Procedure Name: Intubation Date/Time: 01/27/2016 3:02 PM Performed by: Melina Copa, DAVID R Pre-anesthesia Checklist: Patient identified, Emergency Drugs available, Suction available and Patient being monitored Patient Re-evaluated:Patient Re-evaluated prior to inductionOxygen Delivery Method: Circle System Utilized Preoxygenation: Pre-oxygenation with 100% oxygen Intubation Type: IV induction Ventilation: Mask ventilation without difficulty Laryngoscope Size: Mac and 4 Grade View: Grade I Tube type: Oral Tube size: 8.0 mm Number of attempts: 1 Airway Equipment and Method: Stylet and Oral airway Placement Confirmation: ETT inserted through vocal cords under direct vision,  positive ETCO2 and breath sounds checked- equal and bilateral Secured at: 24 cm Tube secured with: Tape Dental Injury: Teeth and Oropharynx as per pre-operative assessment     RIJ CVP Triple Lumen: 1915-1930: The patient was identified and consent obtained.  TO was performed, and full barrier precautions were used.  The skin was anesthetized with lidocaine.  Once the vein was located with the 22 ga. needle using ultrasound guidance , the wire was inserted into the vein.  The wire location was confirmed with ultrasound.  The tissue was dilated and the catheter was carefully inserted, then sutured in place. A dressing was applied. The patient tolerated the procedure well.   CE

## 2016-01-27 NOTE — Op Note (Signed)
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 A 5 cm incision was carried out over the right infraclavicular region, and electrocautery was utilized to dissect down to the pacemaker pocket. There is extensive and dense calcified scar tissue present. The pacemaker generator was removed and the pacemaker leads were freed up with her dense fibrous scar tissue. The sewing sleeve was also disconnected and removed. The leads were cut. A [employer] locking stylets were inserted into both right ventricular as well as right atrial pacing leads. The leads were locked in place. A [employer] 11 French short mechanical dissection sheath was then utilized. The scar tissue in the right subclavian vein was freed up, in both atrial and ventricular leads down to the junction of the superior vena cava. Attempts to make the transition around the superior vena cava were unsuccessful due to the very sharp angle and extensive scar tissue. At this point the right femoral venous sheath was removed and a 16 French sheath was inserted over a guidewire and advanced under fluoroscopic guidance to the right atrium. An inner Cook 12 [place] sheath with a needles eye snare was then inserted into the 16 [place] sheath. The atrial lead was initially targeted. After approximately 20 minutes the atrial lead was snared successfully. Gentle traction was placed on the lead, and the atrial lead was freed up in total with the exception of a very small piece  of lead tip that was embedded in the right atrium. There is no hemodynamic compromise. At this point, attention was turned to the right ventricular lead. Over the course of the next hour, attempts to snare the right ventricular lead were unsuccessful. We then returned to the proximal portion of the lead in the right infraclavicular pocket. Once again, attempts to traverse the right angle around the junction of the right subclavian vein and the superior vena cava were unsuccessful. Additional attempts from the superior approach were thought to be dangerous and abandoned. At this point after approximately 15 more minutes attempting dyspnea or the right ventricular lead, it was deemed important to remove the indwelling left IJ central line. Once the central line was removed, the right ventricular lead was snared successfully. Attempts to pull the lead out of the right ventricle were quite difficult. A proximately 30 minutes was spent once the lead was snared providing initially gentle and then brisk traction on the ventricular lead itself. There was modest hemodynamic compromise thought secondary to invagination of the right ventricle. Finally, the ventricular lead was removed in total. As the lead was withdrawn into the inferior vena cava and then into the right femoral vein, the lead was stuck at the junction of the right femoral vein and the skin. Additional traction was placed on the sheath and the lead broke with the tip of the lead remaining in the junction of the right femoral vein. The tip of the lead appeared stuck. At this point I consulted the vascular surgery team and Dr. Trula Slade assisted in retrieval of the final portion of the ventricular lead. The details of this will be provided in a separate dictation.  At this point, attention was turned to the right infraclavicular pocket. The pocket was irrigated with antibiotic irrigation and electrocautery was utilized to assure hemostasis. The skin was closed  with 3-0 Prolene suture. At this point the left femoral venous sheath was removed. Hemostasis was obtained. The patient was subsequently returned to the recovery area in stable condition. Of note, the patient's intraoperative hematocrit was down from 27 to 20 and it is anticipated that he will be transfused 2 units of packed red blood cells.  Complications: The procedure was complicated by breakage of the tip of the ventricular pacing lead requiring vascular surgery to retrieve the broken tip from the right femoral venous system.  Estimated blood loss: For my portion of the procedure, the estimated blood loss was 50 cc.  Conclusion: Successful extraction of a 78 year old dual-chamber pacing system in a patient with staph aureus bacteremia and pacemaker lead endocarditis. The patient is not pacemaker dependent. He will undergo assessment of repeat pacemaker insertion as time goes on.  Cristopher Peru, M.D.

## 2016-01-27 NOTE — Progress Notes (Signed)
Hct 25 Istat after approx 1/2 unit # 1 prbc absorbed, NSR 80's, right rad art line 200-210/ 70's, RUE cuff pressures 160's/70's. Dr Oletta Lamas here and fully updated. Per her request, Dr Lovena Le fully updated also by Hillery Hunter RN - new order to hold # 2 prbc & recheck hct at 2300 tonight.

## 2016-01-27 NOTE — OR Nursing (Signed)
Explanted [address] Generator Identity XLDR Model num [id number] Serial Number [id number] Explanted [employer] Jude A lead 1142T-52cm Serial number [id number] Explanted [employer] Jude V Lead 1136T-58cm Serial Number [id number]

## 2016-01-27 NOTE — Progress Notes (Signed)
Progress Note    [name]  GBT:[id number] DOB: April 21, 1936  DOA: 01/16/2016 PCP: [name], MD    Brief Narrative:   [name] Whitenack. is an 80 y.o. male with a PMH of paroxysmal atrial fibrillation/pacemaker not on anticoagulation secondary to history of GI bleed, CAD, stage IV CKD, CAD, hypertension,Liddle's syndrome, myelodysplastic syndrome, PVD and hyperlipidemia who was admitted 01/16/16 with multifocal pneumonia complicated by coagulopathy and hematuria. Blood cultures were positive for MSSA. Patient subsequently developed severe wrist pain and underwent arthrotomy of left wrist joint 01/21/16. Pseudogout suspected given negative synovial fluid cultures. Echocardiogram done which showed echodensity on RA lead.  Assessment/Plan:   Principal Problem:   Staphylococcus aureus bacteremia with pacemaker endocarditis, possible septic arthritis and right lower lobe pneumonia/Multifocal pneumonia Will need pacemaker extraction. Per ID recs: Current guidelines state that he should be safe to replace a new pacemaker 14 days after the first negative blood culture (that would be 01/31/2016). He will need a total of 4 weeks of antibiotic therapy through 02/12/2016.He is currently on rifampin and Ancef, which is being renally adjusted per nephrology recommendations. Interventional radiology placed a cuffed central catheter on 01/25/16 after surveillance cultures were negative. TEE done 01/26/16 with no evidence of vegetation.Palliative care consultation requested given overall failure to thrive, poor prognosis with progressive renal failure (does not want to consider HD and would likely not be a good candidate).  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 5 cm masslike area consistent with a blood clot. Bladder tumor could not be completely ruled out.We'll have RN bladder scan to rule out obstruction given  his mass/clot since his creatinine is rising. Evaluated by urology with a cystoscopy performed, status post bladder clot evacuation and bladder irrigation. Continues to have clots.    Coronary artery disease/chronic diastolic CHF  Status post CABG. Aspirin/Plavix currently on hold secondary to hematuria and hemoptysis. Continue Lipitor, Imdur and Coreg. No evidence of heart failure exacerbation. Diuretics currently on hold. Monitor daily weights and I/O. Remains compensated.    PAF (paroxysmal atrial fibrillation) (HCC) status post pacemaker for sick sinus syndrome CHA2DS2Vasc is at least 4, but not felt to be a candidate for anticoagulation given history of GI bleeding. Continue amiodarone.    Acute renal failure superimposed on stage IV chronic kidney disease/renal artery stenosis Baseline creatinine 3.8. Creatinine rising. Discussed case with Dr. Marval Regal who has seen the patient in consultation. Agree with gentle IV fluids per nephrology recommendations. Creatinine continues to deteriorate. Differential remains broad with ATN, interstitial nephritis, postinfectious GN or progressive CKD all possible. Continue management per nephrology.    Wrist swelling secondary to probable pseudogout, synovial fluid cultures pending Seen by orthopedics, status post incision and drainage 01/21/16. Operative synovial fluid Gram stain was negative for organisms. CPPD crystals noted. Findings most consistent with pseudogout. Continue prednisone. Follow-up cultures. Will follow-up with Dr. Grandville Silos.    Liddle's syndrome Monitor for hypokalemia and high blood pressure.    Myelodysplastic syndrome/anemia of chronic disease Gets Epogen injections every 2 weeks at the cancer center. Hemoglobin trending down. Continue to monitor. Hemoglobin is 7 today, given 1 unit of PRBCs 01/26/16. Resume Epogen.    Diabetes mellitus with peripheral artery disease Diet controlled. CBGs [id number]. Currently being managed with SSI,  moderate scale with 4 units of meal coverage. Steroids may make glycemic control harder to achieve. Increase meal coverage to 6 units.    Gastroparesis  Hypertension Continue Coreg and clonidine. Lasix and spironolactone on hold.    Prostate cancer Gets Lupron injections every 4 months. S/P external beam radiation.   Family Communication/Anticipated D/C date and plan/Code Status   DVT prophylaxis: SCDs ordered. Code Status: Full Code.  Family Communication: Wife and multiple other family members updated at the bedside 01/26/16. Disposition Plan: Home when medically stable, likely several more days given current sepsis/pacemaker endocarditis.   Medical Consultants:    Cardiology  Orthopedic Surgery  Infectious Disease  Nephrology  Urology   Procedures:   2 D Echo 01/24/16:  Dynamic obstruction with peak velocity of 351 cm/sec. no regional wall motion abnormalities. Grade 1 diastolic dysfunction. Mobile echodensity in the right atrium attached to pacer wire. Mild systolic pulmonary artery hypertension.  Dg Chest 2 View  01/16/2016  CLINICAL DATA:  Chest pain, cough for 2 days EXAM: CHEST  2 VIEW COMPARISON:  CT chest 04/03/2014 FINDINGS: Cardiomediastinal silhouette is unremarkable. Status post median sternotomy. Dual lead cardiac pacemaker with leads in right atrium and right ventricle. There is streaky airspace opacification in right base infrahilar region and right upper lobe peripheral perihilar. Findings highly suspicious for pneumonia. Probable small right pleural effusion. Left lung is clear. IMPRESSION: There is streaky airspace opacification in right base infrahilar region and right upper lobe peripheral perihilar. Findings highly suspicious for pneumonia. Followup PA and lateral chest X-ray is recommended in 3-4 weeks following trial of antibiotic therapy to ensure resolution and exclude underlying malignancy. Probable small right pleural effusion. Electronically Signed    By: Lahoma Crocker M.D.   On: 01/16/2016 13:58   Dg Wrist 2 Views Left  01/18/2016  CLINICAL DATA:  Acute onset of left wrist pain.  Initial encounter. EXAM: LEFT WRIST - 2 VIEW COMPARISON:  None. FINDINGS: There is no evidence of fracture or dislocation. There is joint space narrowing along the radiocarpal joint, and at the radial aspect of the carpal rows. This resolves in mild chronic deformity of the scaphoid and lunate, with expansion of the scapholunate distance to 5 mm. Scattered vascular calcifications are seen. No additional soft tissue abnormalities are characterized on radiograph. IMPRESSION: 1. No evidence of fracture or dislocation. 2. Degenerative joint space narrowing along the radiocarpal joints, and at the radial aspect of the carpal rows. This results in mild chronic deformity of the scaphoid and lunate, with expansion of the scapholunate distance to 5 mm, reflecting scapholunate dissociation. Electronically Signed   By: Garald Balding M.D.   On: 01/18/2016 16:57   Ct Chest Wo Contrast  01/16/2016  CLINICAL DATA:  Hemoptysis, cough.  Symptoms since Monday. EXAM: CT CHEST WITHOUT CONTRAST TECHNIQUE: TECHNIQUE Multidetector CT imaging of the chest was performed following the standard protocol without IV contrast. COMPARISON:  Chest radiograph 01/16/2016, CT 04/03/2014 FINDINGS: Cardiovascular: Calcification of the thoracic aorta. Coronary bypass graft. Pacer wires in the RIGHT heart. No pericardial fluid. Mediastinum/Nodes: No axillary or supraclavicular adenopathy. No mediastinal hilar adenopathy. Esophagus normal. Lungs/Pleura: There is consolidative airspace disease in the RIGHT lower lobe with air bronchograms. A similar pattern in the posterior aspect the RIGHT upper lobe. Smaller pattern of consolidation in the LEFT lower lobe. Findings are consistent with multifocal pneumonia. Upper Abdomen: Limited view of the liver, kidneys, pancreas are unremarkable. Normal adrenal glands. Musculoskeletal:  No aggressive osseous lesion.  Midline sternotomy. IMPRESSION: 1. Multifocal pneumonia with most dense infection in RIGHT lower lobe. 2. Coronary artery calcification and aortic atherosclerotic calcification. Electronically Signed   By: Helane Gunther.D.  On: 01/16/2016 16:32   US Renal  01/17/2016  CLINICAL DATA:  80 year old male with microscopic hematuria. Personal history of prostate cancer. Initial encounter. EXAM: RENAL / URINARY TRACT ULTRASOUND COMPLETE COMPARISON:  CT Abdomen and Pelvis 08/30/2015, and earlier. Right upper quadrant ultrasound from today reported separately. FINDINGS: Right Kidney: Length: 12.3 cm. Echogenicity within normal limits. No mass or hydronephrosis visualized. Left Kidney: Length: 12.0 cm. Echogenicity within normal limits. No mass or hydronephrosis visualized. Bladder: 5 cm area which is dependent, complex, and mass like (image 15), however, there is no internal vascularity detected with brief color Doppler interrogation. Elsewhere the urinary bladder appears normal. Prostate measured to be 5.2 x 3.1 x 4.3 cm. IMPRESSION: 1. Abnormal 5 cm complex, mass-like area of dependent echogenicity in the urinary bladder. However, lack of internal vascularity on Doppler suggests this is more likely blood clot rather than a bladder tumor. Further evaluation recommended. 2. Normal for age sonographic appearance of both kidneys. Electronically Signed   By: Genevie Ann M.D.   On: 01/17/2016 08:01   US Renal  01/09/2016  CLINICAL DATA:  Two weeks of hematuria ; history of prostate enlargement EXAM: RENAL / URINARY TRACT ULTRASOUND COMPLETE COMPARISON:  Abdominal pelvic CT scan of July 30, 2015 FINDINGS: Right Kidney: Length: 11.5 cm. The renal cortical echotexture is equal to or slightly greater than that of the adjacent liver. There is no hydronephrosis nor discrete mass. Left Kidney: Length: 10.3 cm. The renal cortical echotexture is mildly increased similar to that on the right. There  is no hydronephrosis. Bladder: There is an irregular mass in the posterior aspect of the urinary bladder wall measuring 2.5 x 2 x 4.4 cm. This appears separate from the more anteriorly and inferiorly positioned prostate gland. The prostate gland itself does produce a mild impression upon the urinary bladder base. IMPRESSION: 1. Posterior bladder wall mass worrisome for malignancy. This is separate from the mildly enlarged prostate gland. 2. Increased renal cortical echotexture bilaterally consistent with medical renal disease. There is no hydronephrosis. Electronically Signed   By: David  Martinique M.D.   On: 01/09/2016 14:44   Ir Fluoro Guide Cv Line Left  01/25/2016  INDICATION: Bacteremia due to Staph aureus infection. History of chronic renal insufficiency. Request made for placement of a tunneled PICC line for antibiotic administration. EXAM: TUNNELED PICC LINE WITH ULTRASOUND AND FLUOROSCOPIC GUIDANCE MEDICATIONS: The patient is currently admitted to the hospital and receiving intravenous antibiotics. The antibiotic was given in an appropriate time interval prior to skin puncture. ANESTHESIA/SEDATION: Moderate (conscious) sedation was employed during this procedure. A total of Versed 0.5 mg and Fentanyl 25 mcg was administered intravenously. Moderate Sedation Time: 33 minutes. The patient's level of consciousness and vital signs were monitored continuously by radiology nursing throughout the procedure under my direct supervision. COMPLICATIONS: None immediate. PROCEDURE: Informed written consent was obtained from the patient after a discussion of the risks, benefits, and alternatives to treatment. Questions regarding the procedure were encouraged and answered. Given the presence of the right anterior chest wall pacemaker, the decision was made to place a left internal jugular approach dual lumen PICC line. As such, the left neck and chest were prepped with chlorhexidine in a sterile fashion, and a sterile  drape was applied covering the operative field. Maximum barrier sterile technique with sterile gowns and gloves were used for the procedure. A timeout was performed prior to the initiation of the procedure. After creating a small venotomy incision, a micropuncture kit was utilized to access  the left internal jugular vein under direct, real-time ultrasound guidance after the overlying soft tissues were anesthetized with 1% lidocaine with epinephrine. Ultrasound image documentation was performed. Despite several attempts, there was difficulty advancing the micro wire centrally. As such, a limited central venogram was performed via the inner 3 French sheath from the micropuncture kit demonstrating patency though marked tortuosity of the central aspect of the left internal jugular vein. Ultimately, a Nitrex wire was able to be advanced centrally. The microwire was kinked to measure appropriate catheter length. The micropuncture sheath was exchanged for a peel-away sheath over a guidewire. Initially, a 5 [place] dual lumen tunneled PICC measuring 28 cm was tunneled in a retrograde fashion from the anterior chest wall to the venotomy incision. Unfortunately, the initial catheter was noted to retract within the tortuous left innominate vein with with tip terminating at the level of the SVC. As such, the existing PICC line was cannulated with a GT Glidewire which was advanced to the level of the IVC. Under intermittent fluoroscopic guidance, the existing 28 cm tunneled PICC line was exchanged for a new slightly longer 31 cm dual lumen tunneled PICC line with tip ultimately terminating within the superior aspect the right atrium. Final catheter positioning was confirmed and documented with a spot radiographic image. The catheter aspirates and flushes normally. The catheter was flushed with appropriate volume heparin dwells. The catheter exit site was secured with a 0-Prolene retention suture. The venotomy incision was closed  with Dermabond and Steri-strips. Dressings were applied. The patient tolerated the procedure well without immediate post procedural complication. FINDINGS: After catheter placement, the tip lies within the superior cavoatrial junction. The catheter aspirates and flushes normally and is ready for immediate use. IMPRESSION: Successful placement of 31 cm dual lumen tunneled PICC catheter via the left internal jugular vein with tip terminating at the superior caval atrial junction. The catheter is ready for immediate use. Electronically Signed   By: Sandi Mariscal M.D.   On: 01/25/2016 13:51   Ir US Guide Vasc Access Left  01/25/2016  INDICATION: Bacteremia due to Staph aureus infection. History of chronic renal insufficiency. Request made for placement of a tunneled PICC line for antibiotic administration. EXAM: TUNNELED PICC LINE WITH ULTRASOUND AND FLUOROSCOPIC GUIDANCE MEDICATIONS: The patient is currently admitted to the hospital and receiving intravenous antibiotics. The antibiotic was given in an appropriate time interval prior to skin puncture. ANESTHESIA/SEDATION: Moderate (conscious) sedation was employed during this procedure. A total of Versed 0.5 mg and Fentanyl 25 mcg was administered intravenously. Moderate Sedation Time: 33 minutes. The patient's level of consciousness and vital signs were monitored continuously by radiology nursing throughout the procedure under my direct supervision. COMPLICATIONS: None immediate. PROCEDURE: Informed written consent was obtained from the patient after a discussion of the risks, benefits, and alternatives to treatment. Questions regarding the procedure were encouraged and answered. Given the presence of the right anterior chest wall pacemaker, the decision was made to place a left internal jugular approach dual lumen PICC line. As such, the left neck and chest were prepped with chlorhexidine in a sterile fashion, and a sterile drape was applied covering the operative  field. Maximum barrier sterile technique with sterile gowns and gloves were used for the procedure. A timeout was performed prior to the initiation of the procedure. After creating a small venotomy incision, a micropuncture kit was utilized to access the left internal jugular vein under direct, real-time ultrasound guidance after the overlying soft tissues were anesthetized with 1% lidocaine  with epinephrine. Ultrasound image documentation was performed. Despite several attempts, there was difficulty advancing the micro wire centrally. As such, a limited central venogram was performed via the inner 3 French sheath from the micropuncture kit demonstrating patency though marked tortuosity of the central aspect of the left internal jugular vein. Ultimately, a Nitrex wire was able to be advanced centrally. The microwire was kinked to measure appropriate catheter length. The micropuncture sheath was exchanged for a peel-away sheath over a guidewire. Initially, a 5 [place] dual lumen tunneled PICC measuring 28 cm was tunneled in a retrograde fashion from the anterior chest wall to the venotomy incision. Unfortunately, the initial catheter was noted to retract within the tortuous left innominate vein with with tip terminating at the level of the SVC. As such, the existing PICC line was cannulated with a GT Glidewire which was advanced to the level of the IVC. Under intermittent fluoroscopic guidance, the existing 28 cm tunneled PICC line was exchanged for a new slightly longer 31 cm dual lumen tunneled PICC line with tip ultimately terminating within the superior aspect the right atrium. Final catheter positioning was confirmed and documented with a spot radiographic image. The catheter aspirates and flushes normally. The catheter was flushed with appropriate volume heparin dwells. The catheter exit site was secured with a 0-Prolene retention suture. The venotomy incision was closed with Dermabond and Steri-strips.  Dressings were applied. The patient tolerated the procedure well without immediate post procedural complication. FINDINGS: After catheter placement, the tip lies within the superior cavoatrial junction. The catheter aspirates and flushes normally and is ready for immediate use. IMPRESSION: Successful placement of 31 cm dual lumen tunneled PICC catheter via the left internal jugular vein with tip terminating at the superior caval atrial junction. The catheter is ready for immediate use. Electronically Signed   By: Sandi Mariscal M.D.   On: 01/25/2016 13:51   Dg Fluoro Guided Needle Plc Aspiration/injection Loc  01/21/2016  CLINICAL DATA:  Left wrist pain and swelling.  Sepsis. EXAM: LEFT WRIST ASPIRATION UNDER FLUOROSCOPY FLUOROSCOPY TIME:  Fluoroscopy Time (in minutes and seconds): 24 SECONDS PROCEDURE: Overlying skin prepped with Betadine, draped in the usual sterile fashion, and infiltrated locally with buffered Lidocaine. 20 gauge needle advanced into the radiocarpal joint under direct fluoroscopic visualization. Approximately 3 cc of bloody fluid was aspirated from the joint and overlying soft tissues. IMPRESSION: Left radiocarpal joint aspiration performed without immediate complication. Mr. Stumph with tolerated the procedure well. Technically successful  hip injection under fluoroscopy. Electronically Signed   By: Lorriane Shire M.D.   On: 01/21/2016 08:10   US Abdomen Limited Ruq  01/17/2016  CLINICAL DATA:  80 year old male with abnormal LFTs. Prior cholecystectomy. Pneumonia. Initial encounter. EXAM: US ABDOMEN LIMITED - RIGHT UPPER QUADRANT COMPARISON:  Noncontrast chest CT 01/16/2016. CT Abdomen and Pelvis 08/30/2015 and earlier FINDINGS: Gallbladder: Surgically absent Common bile duct: Diameter: 6 mm, normal Liver: Chronic 2.6 cm left hepatic lobe cyst (image 18) does demonstrate mild wall thickening, however, this lesion is stable on multiple prior CTs back to 2011. Similar 3.1 cm right lobe cyst,  also not significantly changed since 2011. Underlying hepatic echotexture is within normal limits. No intrahepatic biliary ductal dilatation is evident. No new liver lesion identified. Other findings: Negative visible right kidney. No free fluid identified. IMPRESSION: No acute liver or biliary findings. 2-3 cm liver cysts which are mildly thick walled but benign, having not significantly changed since 2011. Electronically Signed   By: Herminio Heads.D.  On: 01/17/2016 07:56    Anti-Infectives:   Azithromycin 01/16/16---> 01/17/16 Rocephin 01/16/16---> 01/17/16 Ancef 01/17/16---> Rifampin 01/17/16 --->  Subjective:   Abubakr D Cadiente Sr. continues to feel weak and dizzy.  RN reports evacuation of clots in bladder. Appetite fair.  No BM in 2 days.  Objective:    Filed Vitals:   01/26/16 2027 01/26/16 2051 01/27/16 0512 01/27/16 0752  BP: 128/64 127/60 214/90 170/73  Pulse: 73 72 91 87  Temp: 98.2 F (36.8 C) 98.1 F (36.7 C) 97.6 F (36.4 C) 97.8 F (36.6 C)  TempSrc: Oral Oral Oral Oral  Resp: _0 Height:      Weight:      SpO2: 98% 100% 97% 97%    Intake/Output Summary (Last 24 hours) at 01/27/16 0835 Last data filed at 01/27/16 0825  Gross per 24 hour  Intake   2145 ml  Output  11051 ml  Net  -8906 ml   Filed Weights   01/24/16 0416 01/25/16 0534 01/26/16 0628  Weight: 77.52 kg (170 lb 14.4 oz) 76.25 kg (168 lb 1.6 oz) 72.394 kg (159 lb 9.6 oz)    Exam: General exam: Weak, sitting up in chair. Respiratory system: Clear to auscultation. Respiratory effort normal. Cardiovascular system: HSIR/tachy. No JVD,  rubs, gallops or clicks. Grade 2/6 SEM. Gastrointestinal system: Abdomen is nondistended, soft and nontender. No organomegaly or masses felt. Normal bowel sounds heard. Central nervous system: Mildly lethargic. No focal neurological deficits. Extremities: Left hand markedly erythematous/swollen and tender with limited range of motion of fingers. Skin: No rashes, lesions  or ulcers Psychiatry: Judgement and insight appear normal. Mood & affect appropriate.   Data Reviewed:   I have personally reviewed following labs and imaging studies:  Labs: Basic Metabolic Panel:  Recent Labs Lab 01/22/16 0523 01/23/16 0239 01/24/16 0317 01/25/16 0311 01/26/16 0513  NA 131* 128* 131* 130* 131*  K 3.9 3.7 4.2 4.3 4.5  CL 101 98* 102 101 102  CO2 20* 20* 20* 20* 20*  GLUCOSE 94 127* 192* 154* 197*  BUN 82* 83* 100* 114* 123*  CREATININE 3.51* 3.69* 4.20* 5.12* 5.71*  CALCIUM 8.8* 8.5* 8.7* 8.6* 8.2*  PHOS  --   --   --   --  4.1   GFR Estimated Creatinine Clearance: 10.9 mL/min (by C-G formula based on Cr of 5.71).  CBC:  Recent Labs Lab 01/23/16 0239 01/24/16 0317 01/25/16 0311 01/26/16 0513  WBC 9.6 10.0 8.6 8.6  HGB 7.9* 8.1* 7.3* 7.0*  HCT 22.9* 23.1* 21.3* 20.5*  MCV 92.3 91.7 92.6 93.2  PLT 169 212 238 251   CBG:  Recent Labs Lab 01/26/16 1158 01/26/16 1657 01/26/16 2143 01/27/16 0129 01/27/16 0607  GLUCAP 192* 260* 246* 262* 213*   Microbiology Recent Results (from the past 240 hour(s))  Culture, blood (routine x 2)     Status: None   Collection Time: 01/17/16  9:50 AM  Result Value Ref Range Status   Specimen Description BLOOD BLOOD LEFT ARM  Final   Special Requests BOTTLES DRAWN AEROBIC ONLY 5CC  Final   Culture NO GROWTH 5 DAYS  Final   Report Status 01/22/2016 FINAL  Final  Culture, blood (routine x 2)     Status: None   Collection Time: 01/17/16  9:50 AM  Result Value Ref Range Status   Specimen Description BLOOD LEFT ANTECUBITAL  Final   Special Requests BOTTLES DRAWN AEROBIC ONLY 5CC  Final   Culture NO GROWTH  5 DAYS  Final   Report Status 01/22/2016 FINAL  Final  Acid Fast Smear (AFB)     Status: None   Collection Time: 01/17/16 11:26 AM  Result Value Ref Range Status   AFB Specimen Processing Concentration  Final   Acid Fast Smear Negative  Final    Comment: (NOTE) Performed At: [facility] [place], [place] [id number] Lindon Romp MD QJ:[id number]    Source (AFB) SPUTUM  Final  Body fluid culture     Status: None   Collection Time: 01/19/16  4:37 PM  Result Value Ref Range Status   Specimen Description FLUID SYNOVIAL WRIST LEFT  Final   Special Requests NONE  Final   Gram Stain   Final    ABUNDANT WBC PRESENT,BOTH PMN AND MONONUCLEAR NO ORGANISMS SEEN    Culture NO GROWTH 3 DAYS  Final   Report Status 01/22/2016 FINAL  Final  Aerobic/Anaerobic Culture (surgical/deep wound)     Status: None   Collection Time: 01/21/16  6:52 PM  Result Value Ref Range Status   Specimen Description WOUND LEFT WRIST  Final   Special Requests NONE  Final   Gram Stain   Final    WBC PRESENT,BOTH PMN AND MONONUCLEAR NO ORGANISMS SEEN    Culture No growth aerobically or anaerobically.  Final   Report Status 01/26/2016 FINAL  Final    Radiology: Ir Fluoro Guide Cv Line Left  01/25/2016  INDICATION: Bacteremia due to Staph aureus infection. History of chronic renal insufficiency. Request made for placement of a tunneled PICC line for antibiotic administration. EXAM: TUNNELED PICC LINE WITH ULTRASOUND AND FLUOROSCOPIC GUIDANCE MEDICATIONS: The patient is currently admitted to the hospital and receiving intravenous antibiotics. The antibiotic was given in an appropriate time interval prior to skin puncture. ANESTHESIA/SEDATION: Moderate (conscious) sedation was employed during this procedure. A total of Versed 0.5 mg and Fentanyl 25 mcg was administered intravenously. Moderate Sedation Time: 33 minutes. The patient's level of consciousness and vital signs were monitored continuously by radiology nursing throughout the procedure under my direct supervision. COMPLICATIONS: None immediate. PROCEDURE: Informed written consent was obtained from the patient after a discussion of the risks, benefits, and alternatives to treatment. Questions regarding the procedure were encouraged and answered.  Given the presence of the right anterior chest wall pacemaker, the decision was made to place a left internal jugular approach dual lumen PICC line. As such, the left neck and chest were prepped with chlorhexidine in a sterile fashion, and a sterile drape was applied covering the operative field. Maximum barrier sterile technique with sterile gowns and gloves were used for the procedure. A timeout was performed prior to the initiation of the procedure. After creating a small venotomy incision, a micropuncture kit was utilized to access the left internal jugular vein under direct, real-time ultrasound guidance after the overlying soft tissues were anesthetized with 1% lidocaine with epinephrine. Ultrasound image documentation was performed. Despite several attempts, there was difficulty advancing the micro wire centrally. As such, a limited central venogram was performed via the inner 3 French sheath from the micropuncture kit demonstrating patency though marked tortuosity of the central aspect of the left internal jugular vein. Ultimately, a Nitrex wire was able to be advanced centrally. The microwire was kinked to measure appropriate catheter length. The micropuncture sheath was exchanged for a peel-away sheath over a guidewire. Initially, a 5 [place] dual lumen tunneled PICC measuring 28 cm was tunneled in a retrograde fashion from the anterior  chest wall to the venotomy incision. Unfortunately, the initial catheter was noted to retract within the tortuous left innominate vein with with tip terminating at the level of the SVC. As such, the existing PICC line was cannulated with a GT Glidewire which was advanced to the level of the IVC. Under intermittent fluoroscopic guidance, the existing 28 cm tunneled PICC line was exchanged for a new slightly longer 31 cm dual lumen tunneled PICC line with tip ultimately terminating within the superior aspect the right atrium. Final catheter positioning was confirmed and  documented with a spot radiographic image. The catheter aspirates and flushes normally. The catheter was flushed with appropriate volume heparin dwells. The catheter exit site was secured with a 0-Prolene retention suture. The venotomy incision was closed with Dermabond and Steri-strips. Dressings were applied. The patient tolerated the procedure well without immediate post procedural complication. FINDINGS: After catheter placement, the tip lies within the superior cavoatrial junction. The catheter aspirates and flushes normally and is ready for immediate use. IMPRESSION: Successful placement of 31 cm dual lumen tunneled PICC catheter via the left internal jugular vein with tip terminating at the superior caval atrial junction. The catheter is ready for immediate use. Electronically Signed   By: Sandi Mariscal M.D.   On: 01/25/2016 13:51   Ir US Guide Vasc Access Left  01/25/2016  INDICATION: Bacteremia due to Staph aureus infection. History of chronic renal insufficiency. Request made for placement of a tunneled PICC line for antibiotic administration. EXAM: TUNNELED PICC LINE WITH ULTRASOUND AND FLUOROSCOPIC GUIDANCE MEDICATIONS: The patient is currently admitted to the hospital and receiving intravenous antibiotics. The antibiotic was given in an appropriate time interval prior to skin puncture. ANESTHESIA/SEDATION: Moderate (conscious) sedation was employed during this procedure. A total of Versed 0.5 mg and Fentanyl 25 mcg was administered intravenously. Moderate Sedation Time: 33 minutes. The patient's level of consciousness and vital signs were monitored continuously by radiology nursing throughout the procedure under my direct supervision. COMPLICATIONS: None immediate. PROCEDURE: Informed written consent was obtained from the patient after a discussion of the risks, benefits, and alternatives to treatment. Questions regarding the procedure were encouraged and answered. Given the presence of the right  anterior chest wall pacemaker, the decision was made to place a left internal jugular approach dual lumen PICC line. As such, the left neck and chest were prepped with chlorhexidine in a sterile fashion, and a sterile drape was applied covering the operative field. Maximum barrier sterile technique with sterile gowns and gloves were used for the procedure. A timeout was performed prior to the initiation of the procedure. After creating a small venotomy incision, a micropuncture kit was utilized to access the left internal jugular vein under direct, real-time ultrasound guidance after the overlying soft tissues were anesthetized with 1% lidocaine with epinephrine. Ultrasound image documentation was performed. Despite several attempts, there was difficulty advancing the micro wire centrally. As such, a limited central venogram was performed via the inner 3 French sheath from the micropuncture kit demonstrating patency though marked tortuosity of the central aspect of the left internal jugular vein. Ultimately, a Nitrex wire was able to be advanced centrally. The microwire was kinked to measure appropriate catheter length. The micropuncture sheath was exchanged for a peel-away sheath over a guidewire. Initially, a 5 [place] dual lumen tunneled PICC measuring 28 cm was tunneled in a retrograde fashion from the anterior chest wall to the venotomy incision. Unfortunately, the initial catheter was noted to retract within the tortuous left innominate vein  with with tip terminating at the level of the SVC. As such, the existing PICC line was cannulated with a GT Glidewire which was advanced to the level of the IVC. Under intermittent fluoroscopic guidance, the existing 28 cm tunneled PICC line was exchanged for a new slightly longer 31 cm dual lumen tunneled PICC line with tip ultimately terminating within the superior aspect the right atrium. Final catheter positioning was confirmed and documented with a spot radiographic  image. The catheter aspirates and flushes normally. The catheter was flushed with appropriate volume heparin dwells. The catheter exit site was secured with a 0-Prolene retention suture. The venotomy incision was closed with Dermabond and Steri-strips. Dressings were applied. The patient tolerated the procedure well without immediate post procedural complication. FINDINGS: After catheter placement, the tip lies within the superior cavoatrial junction. The catheter aspirates and flushes normally and is ready for immediate use. IMPRESSION: Successful placement of 31 cm dual lumen tunneled PICC catheter via the left internal jugular vein with tip terminating at the superior caval atrial junction. The catheter is ready for immediate use. Electronically Signed   By: Sandi Mariscal M.D.   On: 01/25/2016 13:51    Medications:   . sodium chloride   Intravenous Once  . amiodarone  100 mg Oral Daily  . atorvastatin  80 mg Oral QHS  . Calcifediol ER  30 mcg Oral QHS  . carvedilol  25 mg Oral BID WC  .  ceFAZolin (ANCEF) IV  1 g Intravenous Q24H  .  ceFAZolin (ANCEF) IV  2 g Intravenous On Call  . chlorhexidine  60 mL Topical Once  . cloNIDine  0.1 mg Oral BID  . gentamicin irrigation  80 mg Irrigation On Call  . hydrALAZINE  50 mg Oral TID  . insulin aspart  0-5 Units Subcutaneous QHS  . insulin aspart  0-9 Units Subcutaneous TID WC  . insulin aspart  3 Units Subcutaneous TID WC  . isosorbide mononitrate  120 mg Oral Daily  . multivitamin with minerals  1 tablet Oral q morning - 10a  . predniSONE  50 mg Oral Q breakfast  . rifampin  600 mg Oral Daily  . tamsulosin  0.4 mg Oral QPC supper   Continuous Infusions: . sodium chloride 50 mL/hr at 01/26/16 1420  . sodium chloride 50 mL/hr at 01/27/16 0540    Time spent: 35 minutes.  The patient is medically complex with multiple co-morbidities and is at high risk for clinical deterioration and requires high complexity decision making and coordination of  care with multiple specialists.    LOS: 11 days   [facility] Pager [phone]. If unable to reach me by pager, please call my cell phone at [phone].  *Please refer to [email or web address], password [email or web address] to get updated schedule on who will round on this patient, as hospitalists switch teams weekly. If 7PM-7AM, please contact night-coverage at [email or web address], password [email or web address] for any overnight needs.  01/27/2016, 8:35 AM

## 2016-01-27 NOTE — Progress Notes (Signed)
Physical Therapy Treatment Patient Details Name: [name]. 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  Has CAP, hematuria, L wrist and ankle pain with gout history.  PMHx:  SSS, pacemaker, CKD, PAF,     PT Comments    Mr. Legate made good progress today, performing sit>stand with light mod +2 assist to boost up to standing.  He pivoted to chair with min +2 assist to maneuver RW and for directional cues.  Pt will benefit from continued skilled PT services to increase functional independence and safety.   Follow Up Recommendations  SNF     Equipment Recommendations  Other (comment) (TBD at next venue of care)    Recommendations for Other Services       Precautions / Restrictions Precautions Precautions: Fall Precaution Comments: septic arthritis Lt wrist, gout flare up Rt ankle, Lt toes Required Braces or Orthoses: Other Brace/Splint Other Brace/Splint: wrist splint for comfort only Restrictions Weight Bearing Restrictions: Yes LUE Weight Bearing: Weight bearing as tolerated    Mobility  Bed Mobility Overal bed mobility: Needs Assistance Bed Mobility: Supine to Sit     Supine to sit: HOB elevated;Mod assist     General bed mobility comments: Assist to elevate trunk as pt uses bed rail with Rt UE.  Bed pad used to scoot pt to EOB once sitting.  Transfers Overall transfer level: Needs assistance Equipment used: Left platform walker Transfers: Sit to/from Stand;Stand Pivot Transfers Sit to Stand: +2 physical assistance;From elevated surface;Mod assist (light mod +2 assist) Stand pivot transfers: +2 physical assistance;+2 safety/equipment;Min assist       General transfer comment: Light mod +2 assist to boost up to standing using bed pad.  Cues for upright posture once standing.  Min +2 assist  to maneuver RW and directional cues.  Ambulation/Gait             General Gait Details: pt declined due to pain   Stairs            Wheelchair Mobility    Modified Rankin (Stroke Patients Only)       Balance Overall balance assessment: Needs assistance Sitting-balance support: No upper extremity supported;Feet supported Sitting balance-Leahy Scale: Fair Sitting balance - Comments: Pt able to maintain upright posture sitting EOB without UE support.  Min guard for safety.   Standing balance support: Bilateral upper extremity supported;During functional activity Standing balance-Leahy Scale: Poor Standing balance comment: Relies on support from Lt plaform RW and physical assist                    Cognition Arousal/Alertness: Awake/alert Behavior During Therapy: WFL for tasks assessed/performed Overall Cognitive Status: Within Functional Limits for tasks assessed                      Exercises General Exercises - Lower Extremity Long Arc Quad: AROM;Both;5 reps;Seated    General Comments General comments (skin integrity, edema, etc.): HR up to 112 with activity. BP 121/68 supine at start of session.      Pertinent Vitals/Pain Pain Assessment: Faces Faces Pain Scale: Hurts whole lot Pain Location: Lt wrist, Rt ankle, Lt toes Pain Descriptors / Indicators: Grimacing;Guarding;Aching Pain Intervention(s): Limited activity within patient's tolerance;Monitored during session;Repositioned    Home Living  Prior Function            PT Goals (current goals can now be found in the care plan section) Acute Rehab PT Goals Patient Stated Goal: none stated PT Goal Formulation: With patient Time For Goal Achievement: 02/04/16 Potential to Achieve Goals: Good Progress towards PT goals: Progressing toward goals    Frequency  Min 2X/week    PT Plan Current plan remains appropriate    Co-evaluation              End of Session Equipment Utilized During Treatment: Gait belt;Oxygen Activity Tolerance: Patient limited by pain;Patient limited by fatigue Patient left: in chair;with call bell/phone within reach;with chair alarm set     Time: [id number] PT Time Calculation (min) (ACUTE ONLY): 19 min  Charges:  $Therapeutic Activity: 8-22 mins                    G Codes:      Collie Siad PT, DPT  Pager: [phone] Phone: [phone] 01/27/2016, 10:56 AM

## 2016-01-27 NOTE — CV Procedure (Signed)
Intra-operative transesophageal Echocardiography Report:  [name] is a 80 year old male with a history of Staphylococcus aureus bacteremia, paroxysmal atrial fibrillation, chronic diastolic heart failure, diabetes mellitus, hypertension, myelodysplastic syndrome, and coronary artery disease status post coronary artery bypass grafting 1994 and  2013. During the course of his evaluation for bacteremia he was noted to have an echodensity upon the atrial lead was of his pacemaker. He is now brought to the operating room for removal of his atrial and ventricular pacemaker leads. Intraoperative transesophageal echocardiography was indicated to evaluate the right and left ventricular function, to assess for pericardial effusion pre-and post lead extraction and to serve as a monitor for intraoperative volume status.  The patient was brought to the operating room [facility] and general anesthesia was induced without difficulty. Following endotracheal intubation and orogastric suctioning, the transesophageal echocardiography probe was inserted into the esophagus without difficulty.  Impression:  1. Aortic valve: The aortic leaflets were thickened with mild calcification noted. There there was no restriction to opening and no aortic insufficiency noted.  2. Mitral valve: The mitral leaflets are mildly thickened but opened without restriction. There was trace mitral insufficiency. No systolic anterior motion of the mitral valve was noted.  3. Left ventricle: There was severe left ventricular hypertrophy which was concentric. The anterior wall measured 1.37 cm and the posterior wall measured 1.45 cm at end diastole at the mid-papillary level in the transgastric short axis view. Left ventricular end-diastolic diameter measured 2.86 cm. There was near cavity obliteration in systole. The ejection fraction was estimated at 80%.  4. Right ventricle: There appeared to be adequate right ventricular  systolic function. The pacemaker lead could be visualized. No clear vegetation could be identified.  5. Tricuspid valve: There was trace tricuspid insufficiency.  6. Interatrial septum: There was no evidence of atrial septal defect or patent foramen ovale by color Doppler. The right atrial pacemaker lead was identified. No clear vegetation could be identified.  7. Left atrium: There was no thrombus noted within the left atrium or left atrial appendage.  8. Ascending aorta: There was moderate atheromatous disease involving the ascending aorta. The ascending aorta was not dilated or aneurysmal. There was a well-defined sinotubular ridge and aortic root without effacement.  9. Descending aorta: The descending aorta was of normal diameter. There was scattered grade 2-3 atheromatous disease noted within the wall of the descending aorta.  10.Pericardial space: There was no fluid noted within the pericardial space.     9.

## 2016-01-27 NOTE — Progress Notes (Signed)
Bladder irr igation was draining well on change of shift until approximately 0515 when it started to slow down. Bladder was full therefore scanned for highest of 914 cc. NP texted with order to gently irrigate. Using sterile technique, Irrigated out 550 cc with good result with rapid response. Moderate amount of clot was in first pull out. Irrigation flowing good again.

## 2016-01-27 NOTE — Progress Notes (Signed)
RN called for assistance with bladder irrigation. Pt on continuous bladder irrigation, rn noted a decrease in output, bladder scan showed 915 cc in bladder. Order obtained for irrigation PTA. Several medium size clots removed, 550 cc output after clot removal, steady flow from continuous foley irrigation. Pt in NAD

## 2016-01-27 NOTE — Op Note (Signed)
    Patient name: [name] MANVILLE Sr. MRN: [id number] DOB: 06-10-1936 Sex: male  01/16/2016 - 01/27/2016 Pre-operative Diagnosis: Retained lead following lead extraction Post-operative diagnosis:  Same Surgeon:  Annamarie Major Procedure:   #1:  Open exposure of right common femoral vein   #2:  Foreign body removal (lead in right external iliac vein) Anesthesia:  General Blood Loss:  See anesthesia record Specimens:  Pacing lead   Indications:  I was contacted by Dr. Lovena Le when the ventricular pacing lead was unable to be removed and was noted to be lodged in the right femoral vein.  Procedure:  The patient was identified in the holding area and taken to [place] [facility]  The patient was then placed supine on the table. general anesthesia was administered.  The patient was prepped and draped in the usual sterile fashion.  A time out was called and antibiotics were administered.  Dr Lovena Le had removed the right femoral sheath and the ventricular lead was visualized by flouroscopy to be located in the right femoral vein.  Due to concerns for embolization, he asked me to assist with removal.  A longitudinal right groin incision was made over the femoral vein.  Cautery was used to dissect down to the femoral vein.  I got proximal and distal control of the vein and passed silastic loops.  I could no longer palpate the lead.  Floruoscopy indicated that the lead had migrated into the external iliac vein  Through the existing venotomy from the sheath, I passed a #5 Fogarty catheter.  I was unable to extract the lead with 2 passes, however flouro indicated that it had been pulled into the distal external iliac vein.  I then used a # 6 Fogarty catheter and with this, I was able to extract the lead.  Flouro confirmed that it had been completely removed.  There was good antegrade and retrograde bleeding from the vein.  I then closed the venotomy in a transverse fashion with 5-0 Prolene.  There was good hemostasis.  I  then closed the subcutaneous tissue with 2 layers of 3-0 vicryl and the skin was closed with 4-0 vicryl followed by dermabond.  There were no immediate complications.   Disposition:  To PACU in stable condition.   Theotis Burrow, M.D. [facility] Office: [phone] Pager:  [phone]

## 2016-01-27 NOTE — Progress Notes (Signed)
Inpatient Diabetes Program Recommendations  AACE/ADA: New Consensus Statement on Inpatient Glycemic Control (2015)  Target Ranges:  Prepandial:   less than 140 mg/dL      Peak postprandial:   less than 180 mg/dL (1-2 hours)      Critically ill patients:  140 - 180 mg/dL   Results for [name], [name] SR. (MRN [id number]) as of 01/27/2016 09:06  Ref. Range 01/26/2016 07:01 01/26/2016 11:58 01/26/2016 16:57 01/26/2016 21:43 01/27/2016 01:29 01/27/2016 06:07  Glucose-Capillary Latest Ref Range: 65-99 mg/dL 180 (H) 192 (H) 260 (H) 246 (H) 262 (H) 213 (H)    Review of Glycemic Control  Diabetes history: DM2 Outpatient Diabetes medications: None Current orders for Inpatient glycemic control: Novolog 3 units tid with meals + Novolog correction 0-9 units tid  Inpatient Diabetes Program Recommendations:  Noted continued elevated CBGs. Please consider increase in Novolog correction to 0-15 units tid.  Thank you, Nani Gasser. Aviana Shevlin, RN, MSN, CDE Inpatient Glycemic Control Team Team Pager [phone] (8am-5pm) 01/27/2016 9:09 AM

## 2016-01-27 NOTE — Clinical Social Work Note (Signed)
CSW continues to follow for discharge needs. Updated [place] on disposition plan.  [place] Scrape, [place]

## 2016-01-28 ENCOUNTER — Encounter (HOSPITAL_COMMUNITY): Payer: Self-pay | Admitting: Internal Medicine

## 2016-01-28 ENCOUNTER — Inpatient Hospital Stay (HOSPITAL_COMMUNITY): Payer: Medicare Other

## 2016-01-28 DIAGNOSIS — T827XXD Infection and inflammatory reaction due to other cardiac and vascular devices, implants and grafts, subsequent encounter: Secondary | ICD-10-CM

## 2016-01-28 DIAGNOSIS — N184 Chronic kidney disease, stage 4 (severe): Secondary | ICD-10-CM

## 2016-01-28 DIAGNOSIS — R579 Shock, unspecified: Secondary | ICD-10-CM

## 2016-01-28 DIAGNOSIS — G9340 Encephalopathy, unspecified: Secondary | ICD-10-CM

## 2016-01-28 DIAGNOSIS — I959 Hypotension, unspecified: Secondary | ICD-10-CM | POA: Insufficient documentation

## 2016-01-28 DIAGNOSIS — T827XXA Infection and inflammatory reaction due to other cardiac and vascular devices, implants and grafts, initial encounter: Secondary | ICD-10-CM | POA: Insufficient documentation

## 2016-01-28 DIAGNOSIS — N179 Acute kidney failure, unspecified: Secondary | ICD-10-CM

## 2016-01-28 LAB — CBC
HCT: 21.9 % — ABNORMAL LOW (ref 39.0–52.0)
HEMATOCRIT: 26.1 % — AB (ref 39.0–52.0)
Hemoglobin: 7.3 g/dL — ABNORMAL LOW (ref 13.0–17.0)
Hemoglobin: 8.8 g/dL — ABNORMAL LOW (ref 13.0–17.0)
MCH: 31.3 pg (ref 26.0–34.0)
MCH: 31 pg (ref 26.0–34.0)
MCHC: 33.3 g/dL (ref 30.0–36.0)
MCHC: 33.7 g/dL (ref 30.0–36.0)
MCV: 94 fL (ref 78.0–100.0)
MCV: 91.9 fL (ref 78.0–100.0)
PLATELETS: 215 10*3/uL (ref 150–400)
PLATELETS: 264 10*3/uL (ref 150–400)
RBC: 2.33 MIL/uL — AB (ref 4.22–5.81)
RBC: 2.84 MIL/uL — ABNORMAL LOW (ref 4.22–5.81)
RDW: 18.8 % — ABNORMAL HIGH (ref 11.5–15.5)
RDW: 18.3 % — AB (ref 11.5–15.5)
WBC: 11.3 10*3/uL — ABNORMAL HIGH (ref 4.0–10.5)
WBC: 13.3 10*3/uL — ABNORMAL HIGH (ref 4.0–10.5)

## 2016-01-28 LAB — GLUCOSE, CAPILLARY
Glucose-Capillary: 122 mg/dL — ABNORMAL HIGH (ref 65–99)
Glucose-Capillary: 123 mg/dL — ABNORMAL HIGH (ref 65–99)
Glucose-Capillary: 111 mg/dL — ABNORMAL HIGH (ref 65–99)
Glucose-Capillary: 160 mg/dL — ABNORMAL HIGH (ref 65–99)
Glucose-Capillary: 176 mg/dL — ABNORMAL HIGH (ref 65–99)

## 2016-01-28 LAB — RENAL FUNCTION PANEL
Albumin: 2.3 g/dL — ABNORMAL LOW (ref 3.5–5.0)
Anion gap: 10 (ref 5–15)
BUN: 115 mg/dL — AB (ref 6–20)
CHLORIDE: 109 mmol/L (ref 101–111)
CO2: 16 mmol/L — AB (ref 22–32)
CREATININE: 5.53 mg/dL — AB (ref 0.61–1.24)
Calcium: 8.2 mg/dL — ABNORMAL LOW (ref 8.9–10.3)
GFR calc Af Amer: 10 mL/min — ABNORMAL LOW (ref >60)
GFR calc non Af Amer: 9 mL/min — ABNORMAL LOW (ref >60)
GLUCOSE: 126 mg/dL — AB (ref 65–99)
POTASSIUM: 4.1 mmol/L (ref 3.5–5.1)
Phosphorus: 5.3 mg/dL — ABNORMAL HIGH (ref 2.5–4.6)
Sodium: 135 mmol/L (ref 135–145)

## 2016-01-28 LAB — MPO/PR-3 (ANCA) ANTIBODIES: ANCA Proteinase 3: <3.5 U/mL (ref 0.0–3.5)

## 2016-01-28 LAB — PROCALCITONIN: Procalcitonin: 0.96 ng/mL

## 2016-01-28 LAB — LACTIC ACID, PLASMA: LACTIC ACID, VENOUS: 0.6 mmol/L (ref 0.5–1.9)

## 2016-01-28 MED ORDER — SODIUM CHLORIDE 0.9 % IV BOLUS (SEPSIS)
250.0000 mL | Freq: Once | INTRAVENOUS | Status: AC
  Administered 2016-01-28: 250 mL via INTRAVENOUS

## 2016-01-28 MED ORDER — SODIUM CHLORIDE 0.9 % IR SOLN
3000.0000 mL | Status: DC
  Administered 2016-01-28 – 2016-01-29 (×6): 3000 mL

## 2016-01-28 MED ORDER — MUPIROCIN 2 % EX OINT
1.0000 "application " | TOPICAL_OINTMENT | Freq: Two times a day (BID) | CUTANEOUS | Status: AC
  Administered 2016-01-28 – 2016-02-01 (×10): 1 via NASAL
  Filled 2016-01-28 (×3): qty 22

## 2016-01-28 MED ORDER — NOREPINEPHRINE BITARTRATE 1 MG/ML IV SOLN
2.0000 ug/min | INTRAVENOUS | Status: DC
  Administered 2016-01-28: 2 ug/min via INTRAVENOUS
  Filled 2016-01-28: qty 4

## 2016-01-28 MED ORDER — CHLORHEXIDINE GLUCONATE CLOTH 2 % EX PADS
6.0000 | MEDICATED_PAD | Freq: Every day | CUTANEOUS | Status: AC
  Administered 2016-01-28 – 2016-02-01 (×5): 6 via TOPICAL

## 2016-01-28 NOTE — Progress Notes (Signed)
SUBJECTIVE: The patient is doing well today, depressed spirits.   At this time, he denies chest pain, shortness of breath.    . sodium chloride   Intravenous Once  . amiodarone  100 mg Oral Daily  . atorvastatin  80 mg Oral QHS  . Calcifediol ER  30 mcg Oral QHS  . carvedilol  25 mg Oral BID WC  .  ceFAZolin (ANCEF) IV  1 g Intravenous Q24H  . Chlorhexidine Gluconate Cloth  6 each Topical Daily  . cloNIDine  0.1 mg Oral BID  . hydrALAZINE  50 mg Oral TID  . insulin aspart  0-5 Units Subcutaneous QHS  . insulin aspart  0-9 Units Subcutaneous TID WC  . insulin aspart  6 Units Subcutaneous TID WC  . isosorbide mononitrate  120 mg Oral Daily  . multivitamin with minerals  1 tablet Oral q morning - 10a  . mupirocin ointment  1 application Nasal BID  . polyethylene glycol  17 g Oral Daily  . predniSONE  50 mg Oral Q breakfast  . rifampin  600 mg Oral Daily  . tamsulosin  0.4 mg Oral QPC supper   . sodium chloride 50 mL/hr at 01/28/16 1000  . sodium chloride irrigation      OBJECTIVE: Physical Exam: Filed Vitals:   01/28/16 0700 01/28/16 0800 01/28/16 0900 01/28/16 1000  BP: 155/63 151/68 135/57 144/65  Pulse: 88 91 89 86  Temp: 99.1 F (37.3 C)     TempSrc:      Resp: 19 18 18 18   Height:      Weight: 169 lb (76.658 kg)     SpO2: 100% 100% 100% 100%    Intake/Output Summary (Last 24 hours) at 01/28/16 1046 Last data filed at 01/28/16 1000  Gross per 24 hour  Intake 11172.5 ml  Output  14400 ml  Net -3227.5 ml    Telemetry reveals SR, no significant bradycardia  GEN- The patient is ill appearing, alert and oriented x 3 today.   Head- normocephalic, atraumatic Eyes-  Sclera clear, conjunctiva pink Ears- hearing intact Oropharynx- clear Neck- supple, no JVP Lungs- Clear to ausculation bilaterally, normal work of breathing Heart- Regular rate and rhythm, no significant murmurs, no rubs or gallops GI- soft, NT, ND Extremities- no clubbing, cyanosis, or  edema Skin- no rash or lesion Psych- euthymic mood, full affect Neuro- no gross deficits appreciated  All surgical/procedureal wounds/dressings are clean, stable.  LABS: Basic Metabolic Panel:  Recent Labs  01/27/16 0910  01/27/16 2019 01/28/16 0536  NA 133*  < > 136 135  K 4.2  < > 4.5 4.1  CL 106  --   --  109  CO2 18*  --   --  16*  GLUCOSE 179*  --   --  126*  BUN 126*  --   --  115*  CREATININE 5.92*  --   --  5.53*  CALCIUM 8.7*  --   --  8.2*  PHOS 4.7*  --   --  5.3*  < > = values in this interval not displayed. Liver Function Tests:  Recent Labs  01/27/16 0910 01/28/16 0536  ALBUMIN 2.1* 2.3*   CBC:  Recent Labs  01/27/16 0910  01/27/16 2019 01/28/16 0008  WBC 14.1*  --   --  13.3*  HGB 9.1*  < > 8.5* 8.8*  HCT 27.0*  < > 25.0* 26.1*  MCV 92.5  --   --  91.9  PLT 307  --   --  264  < > = values in this interval not displayed. Cardiac Enzymes:  Recent Labs  01/25/16 1519  CKTOTAL 60     ASSESSMENT AND PLAN:  1. Bacteremia with evidence of vegetation on his atrial pacing lead on TTE      POD #1  s/p PPM system extraction yesterday with Dr. Lovena Le                    Required vascular surgery to retrieve RV lead tip from RFV     P/o anemia, stable, follow (s/p 1u PRBC post-op)     VSS, afebrile      2. SSSx, PAFib w/PPM  He is in [place] is at least 4, not on a/c with hx of GIB  no plans for new PPM implant at this time   2. Multifocal pneumonia   (presenting with hemotysis)  3. Hematuria (bladder ?blood clot)   Urology on the case     CBI  4. CKD, ARF     Patient has declined start of HD     Will continue to defer the conversation to nephrology service   Tommye Standard, PA-C 01/28/2016 10:46 AM  EP Attending  Patient seen and examined. Agree with above. He is stable after very difficult PM lead extraction. Exam reveals a clean and dry incision in right chest and right leg. Vitals are stable. CV - RRR,  lungs clear except for rales in right base. Ext. - traced edema A/P 1. Staph aureus PM lead endocarditis - he is s/p extraction. He remains stable. He will not need placement of a TV PM at this time. Watchful waiting. Anti-biotic therapy duration as per the ID service. 2. PM system extraction - he remains stable. No need for PPM re-insertion at this time and no plans for this.  3. Acute on chronic renal failure - his creatinine is a little better today. As per renal service.  Mikle Bosworth.D.

## 2016-01-28 NOTE — Progress Notes (Signed)
    [facility]   Reason for visit: Follow up on pacemaker lead vegetation with MSSA bacteremia  Interval History: repeated blood cultures remain negative; afebrile.  TEE without cardiac vegetation. PM removal and events noted.  Now in 2S.    Physical Exam: Constitutional:  Filed Vitals:   01/28/16 1100 01/28/16 1150  BP: 89/46 64/40  Pulse: 80 81  Temp:  98.1 F (36.7 C)  Resp: 10 15   patient appears in NAD Respiratory: Normal respiratory effort; CTA B Cardiovascular: RRR  Review of Systems: Constitutional: negative for fevers and chills Cardiovascular: negative for chest pain Gastrointestinal: negative for diarrhea  Lab Results  Component Value Date   WBC 13.3* 01/28/2016   HGB 8.8* 01/28/2016   HCT 26.1* 01/28/2016   MCV 91.9 01/28/2016   PLT 264 01/28/2016    Lab Results  Component Value Date   CREATININE 5.53* 01/28/2016   BUN 115* 01/28/2016   NA 135 01/28/2016   K 4.1 01/28/2016   CL 109 01/28/2016   CO2 16* 01/28/2016    Lab Results  Component Value Date   ALT 12* 01/16/2016   AST 30 01/16/2016   ALKPHOS 27* 01/16/2016     Microbiology: Recent Results (from the past 240 hour(s))  Body fluid culture     Status: None   Collection Time: 01/19/16  4:37 PM  Result Value Ref Range Status   Specimen Description FLUID SYNOVIAL WRIST LEFT  Final   Special Requests NONE  Final   Gram Stain   Final    ABUNDANT WBC PRESENT,BOTH PMN AND MONONUCLEAR NO ORGANISMS SEEN    Culture NO GROWTH 3 DAYS  Final   Report Status 01/22/2016 FINAL  Final  Aerobic/Anaerobic Culture (surgical/deep wound)     Status: None   Collection Time: 01/21/16  6:52 PM  Result Value Ref Range Status   Specimen Description WOUND LEFT WRIST  Final   Special Requests NONE  Final   Gram Stain   Final    WBC PRESENT,BOTH PMN AND MONONUCLEAR NO ORGANISMS SEEN    Culture No growth aerobically or anaerobically.  Final   Report Status 01/26/2016 FINAL  Final    Surgical pcr screen     Status: Abnormal   Collection Time: 01/27/16  1:12 PM  Result Value Ref Range Status   MRSA, PCR NEGATIVE NEGATIVE Final   Staphylococcus aureus POSITIVE (A) NEGATIVE Final    Comment:        The Xpert SA Assay (FDA approved for NASAL specimens in patients over 12 years of age), is one component of a comprehensive surveillance program.  Test performance has been validated by [facility] for patients greater than or equal to 32 year old. It is not intended to diagnose infection nor to guide or monitor treatment.     Impression/Plan:  1. Pacemaker vegetation - removed.  I agree with replacement on or after 7/22.  Cefazolin and rifampin.  2. Bacteremia - from #1.  Cleared blood cultures.  Will need IV cefazolin through 02/12/2016.  Likely will need line alternative to PICC with kidney disease.

## 2016-01-28 NOTE — Progress Notes (Signed)
Patient ID: [name] Brow., male   DOB: 30-Apr-1936, 80 y.o.   MRN: [id number] S:events overnight noted.  Appreciate Dr. Stephens Shire assistance. O:BP 151/68 mmHg  Pulse 91  Temp(Src) 99.1 F (37.3 C) (Oral)  Resp 18  Ht '5\' 10"'$  (1.778 m)  Wt 76.658 kg (169 lb)  BMI 24.25 kg/m2  SpO2 100%  Intake/Output Summary (Last 24 hours) at 01/28/16 0835 Last data filed at 01/28/16 0800  Gross per 24 hour  Intake 10622.5 ml  Output  16500 ml  Net -5877.5 ml   Intake/Output: I/O last 3 completed shifts: In: 11247.5 [P.O.:240; I.V.:3687.5; Blood:670; Other:6000; IV Piggyback:650] Out: [id number] [Urine:26650; Blood:300]  Intake/Output this shift:  Total I/O In: 50 [I.V.:50] Out: -  Weight change:  LKG:MWNUU elderly AAM in NAD CVS:no rub Resp:decreased bs at bases VOZ:DGUYQI HKV:QQVZDGL edema   Recent Labs Lab 01/22/16 0523 01/23/16 0239 01/24/16 0317 01/25/16 0311 01/26/16 0513 01/27/16 0910 01/27/16 1854 01/27/16 2019 01/28/16 0536  NA 131* 128* 131* 130* 131* 133* 134* 136 135  K 3.9 3.7 4.2 4.3 4.5 4.2 4.4 4.5 4.1  CL 101 98* 102 101 102 106  --   --  109  CO2 20* 20* 20* 20* 20* 18*  --   --  16*  GLUCOSE 94 127* 192* 154* 197* 179*  --   --  126*  BUN 82* 83* 100* 114* 123* 126*  --   --  115*  CREATININE 3.51* 3.69* 4.20* 5.12* 5.71* 5.92*  --   --  5.53*  ALBUMIN  --   --   --   --  2.0* 2.1*  --   --  2.3*  CALCIUM 8.8* 8.5* 8.7* 8.6* 8.2* 8.7*  --   --  8.2*  PHOS  --   --   --   --  4.1 4.7*  --   --  5.3*   Liver Function Tests:  Recent Labs Lab 01/26/16 0513 01/27/16 0910 01/28/16 0536  ALBUMIN 2.0* 2.1* 2.3*   No results for input(s): LIPASE, AMYLASE in the last 168 hours. No results for input(s): AMMONIA in the last 168 hours. CBC:  Recent Labs Lab 01/24/16 0317 01/25/16 0311 01/26/16 0513 01/27/16 0910 01/27/16 1854 01/27/16 2019 01/28/16 0008  WBC 10.0 8.6 8.6 14.1*  --   --  13.3*  HGB 8.1* 7.3* 7.0* 9.1* 6.8* 8.5* 8.8*  HCT 23.1* 21.3*  20.5* 27.0* 20.0* 25.0* 26.1*  MCV 91.7 92.6 93.2 92.5  --   --  91.9  PLT 212 238 251 307  --   --  264   Cardiac Enzymes:  Recent Labs Lab 01/25/16 1519  CKTOTAL 60   CBG:  Recent Labs Lab 01/27/16 0607 01/27/16 1121 01/27/16 2001 01/27/16 2314 01/28/16 0405  GLUCAP 213* 170* 211* 178* 122*    Iron Studies: No results for input(s): IRON, TIBC, TRANSFERRIN, FERRITIN in the last 72 hours. Studies/Results: Dg Chest Port 1 View  01/28/2016  CLINICAL DATA:  Weakness, infected pacemaker EXAM: PORTABLE CHEST 1 VIEW COMPARISON:  Portable chest x-ray of January 27, 2016 FINDINGS: The left lung is well-expanded and clear. On the right there is mild volume loss with increased density in the upper and lower lobes which is stable. There is no pneumothorax. There is biapical pleural thickening the heart is normal in size. There is dense calcification in the aortic arch. The sternal wires are intact. The right internal jugular venous catheter tip projects over the distal third of the SVC just  above the cavoatrial junction. IMPRESSION: 1. Persistent infiltrate in the right upper and lower lobes. Small right pleural effusion. No definite pneumothorax. 2. The left lung is clear.  No CHF.  Aortic atherosclerosis. Electronically Signed   By: David  Martinique M.D.   On: 01/28/2016 07:11   Dg Chest Portable 1 View  01/27/2016  CLINICAL DATA:  Right IJ placement EXAM: PORTABLE CHEST 1 VIEW COMPARISON:  January 16, 2016 FINDINGS: A right IJ has been placed in the interval, probably terminating just within the right side of the atrium, approximately 2 cm inferior to the caval atrial junction. No pneumothorax. Opacity remains in the right mid lung, less focal in the interval. Opacity and effusion remains in the left lung base, also less focal in the interval. No other interval changes. IMPRESSION: 1. The distal tip of the new right IJ probably terminates approximately 2 cm below the caval atrial junction, just within the  right side of the atrium. Recommend repositioning as clinically warranted. No pneumothorax. 2. The right-sided pulmonary infiltrates are less focal in the interval but remain. There is probably a small amount of layering effusion on the right as well. Electronically Signed   By: Dorise Bullion III M.D   On: 01/27/2016 19:55   . sodium chloride   Intravenous Once  . amiodarone  100 mg Oral Daily  . atorvastatin  80 mg Oral QHS  . Calcifediol ER  30 mcg Oral QHS  . carvedilol  25 mg Oral BID WC  .  ceFAZolin (ANCEF) IV  1 g Intravenous Q24H  .  ceFAZolin (ANCEF) IV  1 g Intravenous Q6H  . Chlorhexidine Gluconate Cloth  6 each Topical Daily  . cloNIDine  0.1 mg Oral BID  . hydrALAZINE  50 mg Oral TID  . insulin aspart  0-5 Units Subcutaneous QHS  . insulin aspart  0-9 Units Subcutaneous TID WC  . insulin aspart  6 Units Subcutaneous TID WC  . isosorbide mononitrate  120 mg Oral Daily  . multivitamin with minerals  1 tablet Oral q morning - 10a  . mupirocin ointment  1 application Nasal BID  . polyethylene glycol  17 g Oral Daily  . predniSONE  50 mg Oral Q breakfast  . rifampin  600 mg Oral Daily  . tamsulosin  0.4 mg Oral QPC supper    BMET    Component Value Date/Time   NA 135 01/28/2016 0536   NA 146* 08/10/2013 1324   K 4.1 01/28/2016 0536   K 3.9 08/10/2013 1324   CL 109 01/28/2016 0536   CO2 16* 01/28/2016 0536   CO2 27 08/10/2013 1324   GLUCOSE 126* 01/28/2016 0536   GLUCOSE 105 08/10/2013 1324   BUN 115* 01/28/2016 0536   BUN 40.3* 08/10/2013 1324   CREATININE 5.53* 01/28/2016 0536   CREATININE 5.79* 11/07/2015 1440   CREATININE 2.6* 08/10/2013 1324   CALCIUM 8.2* 01/28/2016 0536   CALCIUM 9.4 08/10/2013 1324   GFRNONAA 9* 01/28/2016 0536   GFRAA 10* 01/28/2016 0536   CBC    Component Value Date/Time   WBC 13.3* 01/28/2016 0008   WBC 3.3* 01/12/2016 1007   RBC 2.84* 01/28/2016 0008   RBC 2.80* 01/12/2016 1007   RBC 2.64* 03/17/2013 0534   HGB 8.8*  01/28/2016 0008   HGB 9.3* 01/12/2016 1007   HCT 26.1* 01/28/2016 0008   HCT 28.3* 01/12/2016 1007   PLT 264 01/28/2016 0008   PLT 88* 01/12/2016 1007   MCV 91.9 01/28/2016 0008  MCV 101.1* 01/12/2016 1007   MCH 31.0 01/28/2016 0008   MCH 33.2 01/12/2016 1007   MCHC 33.7 01/28/2016 0008   MCHC 32.9 01/12/2016 1007   RDW 18.3* 01/28/2016 0008   RDW 16.9* 01/12/2016 1007   LYMPHSABS 0.8 01/16/2016 1239   LYMPHSABS 0.9 01/12/2016 1007   MONOABS 1.4* 01/16/2016 1239   MONOABS 0.6 01/12/2016 1007   EOSABS 0.0 01/16/2016 1239   EOSABS 0.3 01/12/2016 1007   BASOSABS 0.0 01/16/2016 1239   BASOSABS 0.0 01/12/2016 1007     Assessment/Plan: 1. AKI/CKD- in very ill, elderly male with ongoing MSSA bacteremia and likely pacemaker wire involvement and possible endocarditis. DDx includes ischemic ATN in setting of severe illness, interstitial nephritis from abx, post-infectious GN, or progressive CKD given his advanced CKD at baseline. His bladder mass is also an issue, although no evidence of obstruction by [place]. Discussed this with [name] and his family. He admits that he did not have positive feelings about hemodialysis before this hospitalization and is not currently interested at this time.  1. He is markedly debilitated and agree with his decision not to pursue renal replacement therapy.  2. workup for AKI/CKD thus far most consistent with pre-renal causes (low FeNa, normal complements, negative ASO) and continue with conservative therapy for now.  3. BUN/Scr improved slightly 4. Consult palliative care to help set goals/limits of care. 2. MSSA bacteremia with involvement of pacemaker wire in RA.  1. Changed dose of ancef for advanced CKD (his eGFR <90m/min) to 1 gram daily (which may have contributed to his AKI/CKD) 2. TEE without vegetations  3. S/p pacemaker lead removal complicated by lead breakage and required vascular surgery consult to remove from right femoral  vein. 3. Bladder mass- s/p cysto and consistent with multiple large blood clots 1. Not able to be removed and per Dr. HLouis Meckel 2. Continuous bladder irrigations started last night. 4. Prostate cancer s/p xrt and lupron 5. Myelodysplastic syndrome/Anemia of chronic disease and acute illness. Was on procrit prior to admission. 1. Recommend transfusion of 2 units PRBC's each over 3 hours with lasix '40mg'$  IV between units 6. Hyponatremia - due to #1 as well as poor po intake. Will start IVF's and follow 7. CAD- cardiology following 8. 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 10. Metabolic acidosis- due to #1 11. Disposition- poor overall prognosis in an elderly, debilitated man with multiple chronic issues. He is not interested in HD and palliative care consult to help set goals/limits of care would be helpful. [place]

## 2016-01-28 NOTE — Progress Notes (Signed)
Spoke with Dr. Waldron Labs, pt with low bp, MD order to get 250cc fluid bolus and will consult CCM for possible pressor support, will continue to monitor.  Kathleen Argue S 11:55 AM

## 2016-01-28 NOTE — Progress Notes (Signed)
Subjective  - POD #1, ventricular lead via a right femoral vein approach  No significant discomfort in the right groin   Physical Exam:  Incision is clean and dry       Assessment/Plan:  POD #1  Keep  incision dry. We'll check on patient periodically while he remains in the hospital  Tionne Dayhoff, [place] Nephew 01/28/2016 9:20 AM --  Filed Vitals:   01/28/16 0700 01/28/16 0800  BP: 155/63 151/68  Pulse: 88 91  Temp: 99.1 F (37.3 C)   Resp: 19 18    Intake/Output Summary (Last 24 hours) at 01/28/16 0920 Last data filed at 01/28/16 0800  Gross per 24 hour  Intake 10622.5 ml  Output  16500 ml  Net -5877.5 ml     Laboratory CBC    Component Value Date/Time   WBC 13.3* 01/28/2016 0008   WBC 3.3* 01/12/2016 1007   HGB 8.8* 01/28/2016 0008   HGB 9.3* 01/12/2016 1007   HCT 26.1* 01/28/2016 0008   HCT 28.3* 01/12/2016 1007   PLT 264 01/28/2016 0008   PLT 88* 01/12/2016 1007    BMET    Component Value Date/Time   NA 135 01/28/2016 0536   NA 146* 08/10/2013 1324   K 4.1 01/28/2016 0536   K 3.9 08/10/2013 1324   CL 109 01/28/2016 0536   CO2 16* 01/28/2016 0536   CO2 27 08/10/2013 1324   GLUCOSE 126* 01/28/2016 0536   GLUCOSE 105 08/10/2013 1324   BUN 115* 01/28/2016 0536   BUN 40.3* 08/10/2013 1324   CREATININE 5.53* 01/28/2016 0536   CREATININE 5.79* 11/07/2015 1440   CREATININE 2.6* 08/10/2013 1324   CALCIUM 8.2* 01/28/2016 0536   CALCIUM 9.4 08/10/2013 1324   GFRNONAA 9* 01/28/2016 0536   GFRAA 10* 01/28/2016 0536    COAG Lab Results  Component Value Date   INR 1.28 01/17/2016   INR 1.27 01/16/2016   INR 1.06 03/17/2013   No results found for: PTT  Antibiotics Anti-infectives    Start     Dose/Rate Route Frequency Ordered Stop   01/27/16 2200  ceFAZolin (ANCEF) IVPB 1 g/50 mL premix     1 g 100 mL/hr over 30 Minutes Intravenous Every 6 hours 01/27/16 2130 01/28/16 1559   01/27/16 1500  gentamicin (GARAMYCIN) 80 mg in sodium chloride  irrigation 0.9 % 500 mL irrigation  Status:  Discontinued      Irrigation To Surgery 01/27/16 1448 01/27/16 2119   01/27/16 1500  gentamicin (GARAMYCIN) 80 mg in sodium chloride irrigation 0.9 % 500 mL irrigation  Status:  Discontinued       As needed 01/27/16 1501 01/27/16 1943   01/27/16 0600  ceFAZolin (ANCEF) IVPB 2g/100 mL premix     2 g 200 mL/hr over 30 Minutes Intravenous On call 01/27/16 0442 01/27/16 1525   01/27/16 0445  gentamicin (GARAMYCIN) 80 mg in sodium chloride irrigation 0.9 % 500 mL irrigation  Status:  Discontinued     80 mg Irrigation On call 01/27/16 0442 01/27/16 2119   01/25/16 1700  ceFAZolin (ANCEF) IVPB 2g/100 mL premix  Status:  Discontinued     2 g 200 mL/hr over 30 Minutes Intravenous Every 24 hours 01/25/16 1500 01/25/16 1524   01/25/16 1700  ceFAZolin (ANCEF) IVPB 1 g/50 mL premix     1 g 100 mL/hr over 30 Minutes Intravenous Every 24 hours 01/25/16 1524     01/21/16 0800  ceFAZolin (ANCEF) IVPB 2g/100 mL premix  Status:  Discontinued  2 g 200 mL/hr over 30 Minutes Intravenous Every 12 hours 01/21/16 0802 01/25/16 1432   01/17/16 1600  rifampin (RIFADIN) capsule 600 mg     600 mg Oral Daily 01/17/16 1508     01/17/16 1200  ceFAZolin (ANCEF) IVPB 2g/100 mL premix  Status:  Discontinued     2 g 200 mL/hr over 30 Minutes Intravenous Every 12 hours 01/17/16 1129 01/21/16 0802   01/16/16 1800  cefTRIAXone (ROCEPHIN) 1 g in dextrose 5 % 50 mL IVPB  Status:  Discontinued     1 g 100 mL/hr over 30 Minutes Intravenous Every 24 hours 01/16/16 1714 01/17/16 1048   01/16/16 1800  azithromycin (ZITHROMAX) 500 mg in dextrose 5 % 250 mL IVPB  Status:  Discontinued     500 mg 250 mL/hr over 60 Minutes Intravenous Every 24 hours 01/16/16 1714 01/17/16 1048   01/16/16 1345  cefTRIAXone (ROCEPHIN) 1 g in dextrose 5 % 50 mL IVPB     1 g 100 mL/hr over 30 Minutes Intravenous  Once 01/16/16 1336 01/16/16 1441   01/16/16 1345  azithromycin (ZITHROMAX) 500 mg in dextrose 5  % 250 mL IVPB     500 mg 250 mL/hr over 60 Minutes Intravenous  Once 01/16/16 1336 01/16/16 1510       V. Leia Alf, M.D. [facility] Office: [phone] Pager:  [phone]

## 2016-01-28 NOTE — Progress Notes (Signed)
I stopped by to meet with [name] today to discuss goals moving froward.  He reports that he is very tired and does not want to talk with me today. I had met up with him during prior admission back in February and he did not really want to engage with me in conversation at that time either. I asked if it would be okay for me to reach out to his family to set up a meeting in order to discuss his care moving forward. He acknowledged that I can call his wife, [name].  I called and left a message at the number on the chart patient's wife. I have also left my card with his bedside nurse and asked that she pass along to family to call to set up an appointment whenever they come back to the hospital to visit.  Will follow up again tomorrow.  Micheline Rough, MD [facility] [phone]

## 2016-01-28 NOTE — Clinical Social Work Note (Signed)
Patient transferred from [place] to 2S. 2S CSW notified. [place] notified of update for poor prognosis.  This CSW signing off.  Dayton Scrape, [place]

## 2016-01-28 NOTE — Consult Note (Signed)
PULMONARY / CRITICAL CARE MEDICINE   Name: [name] PORTOCARRERO Sr. MRN: [id number] DOB: 12-09-1935    ADMISSION DATE:  01/16/2016 CONSULTATION DATE:  7/19  REFERRING MD:  Elgergawy ([facility])   CHIEF COMPLAINT:  Hypotension   HISTORY OF PRESENT ILLNESS:   80yo male with multiple medical problems including CAD s/p CABG, myelodysplastic syndrome with thrombocytopenia, HTN, Afib, chronic dCHF, CKD 4, who was admitted 7/7 with PNA and MSSA bacteremia r/t infected pacemaker wire.  He was treated with Ancef and rifampin.  Course has been c/b AKI on CKD (previously thought poor HD candidate) as well as large clot within the bladder (now with continuous bladder irrigation).  He underwent pacemaker removal 7/18 by vascular surgery. TEE negative for cardiac vegetation.  7/19 developed hypotension after am meds (including 4 anti-HTN and percocet per RN) and PCCM consulted.   Currently resting comfortably in bed.  Sleepy but easily arousable.  Oriented.  C/o L arm and L chest incisional pain.  Denies chest pain, SOB, dizziness, n/v.    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  PAST SURGICAL HISTORY: He  has past surgical  history that includes Hiatal hernia repair; Appendectomy (1991); Pacemaker insertion (10/18/1994); Coronary artery bypass graft (01/22/1993); Cholecystectomy (Oct 2009); Coronary artery bypass graft (01/03/2012); Celiac artery angioplasty (05-16-12); Partial gastrectomy (1969); Other surgical history (02/13/13); Thrombectomy / embolectomy subclavian artery (02/02/10); Other surgical history (05/16/12); Pacemaker generator change (12/10/2003); visceral angiogram (N/A, 05/25/2011); abdominal angiogram (N/A, 05/25/2011); lower extremity angiogram (Bilateral, 05/25/2011); left heart catheterization with coronary/graft angiogram (12/24/2011); visceral angiogram (Bilateral, 12/28/2011); visceral angiogram (N/A, 05/16/2012); visceral angiogram (N/A, 02/13/2013); renal angiogram (N/A, 02/13/2013); abdominal angiogram (N/A, 02/13/2013); Inguinal hernia repair (Right, 10/28/2015); Insertion of mesh (Right, 10/28/2015); I&D extremity (Left, 01/21/2016); and TEE without cardioversion (N/A, 01/26/2016).  Allergies  Allergen Reactions  . Nsaids Other (See Comments)    GI issue  . Ibuprofen Other (See Comments)    GI Issues  . Ace Inhibitors Cough    No current facility-administered medications on file prior to encounter.   Current Outpatient Prescriptions on File Prior to Encounter  Medication Sig  . acetaminophen (ARTHRITIS PAIN RELIEF) 650 MG CR tablet Take 1,300 mg by mouth 3 (three) times daily.   Marland Kitchen amiodarone (PACERONE) 200 MG tablet Take one-half tablet by  mouth daily  . aspirin 81 MG tablet Take 1 tablet (81 mg total) by mouth daily.  Marland Kitchen atorvastatin (LIPITOR) 80 MG tablet Take 80 mg by mouth at bedtime.   . Calcifediol ER (RAYALDEE) 30 MCG CPCR Take 30 mcg by mouth at bedtime.  Marland Kitchen CALCIUM-VITAMIN D PO Take 1 capsule by mouth 2 (two) times daily.  . carvedilol (COREG) 25 MG tablet TAKE 1 TABLET BY MOUTH  TWICE DAILY WITH MEALS  . cloNIDine (CATAPRES) 0.1 MG tablet Take 0.1 mg by mouth 2 (two) times daily.  . clopidogrel  (PLAVIX) 75 MG tablet Take 1 tablet by  mouth  daily  . cyanocobalamin (,VITAMIN B-12,) 1000 MCG/ML injection Inject 1,000 mcg into the muscle every 30 (thirty) days. Vitamin B12 - last injection 09/01/15  . epoetin alfa (EPOGEN,PROCRIT) 16109 UNIT/ML injection Inject 10,000 Units into the skin every 14 (fourteen) days. Done at [facility] - last injection 09/22/15  . furosemide (LASIX) 80 MG tablet Take 80 mg by mouth 2 (two) times daily. Pt takes 1 and 1/2 tablet daily. (140 mg) total  . hydrALAZINE (APRESOLINE) 50 MG tablet Take 1 tablet (50 mg total) by mouth 3 (three) times daily.  . isosorbide mononitrate (IMDUR) 120 MG 24 hr tablet Take 1 tablet by mouth  daily  . Multiple Vitamin (MULTIVITAMIN WITH MINERALS) TABS tablet Take 1 tablet by mouth every morning.  . potassium chloride SA (K-DUR,KLOR-CON) 20 MEQ tablet Take 20-40 mEq by mouth 3 (three) times daily. Take 2 tablets (40 meq) by mouth with breakfast, take 1 tablet (20 meq) with lunch and supper  . PRESCRIPTION MEDICATION Supportive Therapy [facility]  . silodosin (RAPAFLO) 8 MG CAPS capsule Take 8 mg by mouth at bedtime.   Marland Kitchen spironolactone (ALDACTONE) 25 MG tablet Take 25 mg by mouth daily.  Marland Kitchen leuprolide (LUPRON) 30 MG injection Inject 30 mg into the muscle every 4 (four) months.    FAMILY HISTORY:  His indicated that his mother is deceased. He indicated that his father is deceased. He indicated that only one of his three sisters is alive. He indicated that his brother is deceased. He indicated that his maternal grandmother is deceased. He indicated that his maternal grandfather is deceased. He indicated that his paternal grandmother is deceased. He indicated that his paternal grandfather is deceased. He indicated that his paternal uncle is deceased.   SOCIAL HISTORY: He  reports that he quit smoking about 46 years ago. His smoking use included Cigarettes. He quit after 20 years of use. He has never used smokeless tobacco. He reports  that he does not drink alcohol or use illicit drugs.  REVIEW OF SYSTEMS:   As per HPI - All other systems reviewed and were neg.     SUBJECTIVE:    VITAL SIGNS: BP 76/41 mmHg  Pulse 73  Temp(Src) 98.1 F (36.7 C) (Oral)  Resp 10  Ht 5\' 10"  (1.778 m)  Wt 76.658 kg (169 lb)  BMI 24.25 kg/m2  SpO2 100%  HEMODYNAMICS:    VENTILATOR SETTINGS:    INTAKE / OUTPUT: I/O last 3 completed shifts: In: 11247.5 [P.O.:240; I.V.:3687.5; Blood:670; Other:6000; IV Piggyback:650] Out: [id number] HL:[id number]; Blood:300]  PHYSICAL EXAMINATION: General:  Pleasant, elderly male, NAD in bed  Neuro:  Drowsy but easily arousable, MAE, oriented  HEENT:  Mm moist, no JVD Cardiovascular:  s1s2 rrr, no JVD  Lungs:  resps even non labored on [place], diminished bases, otherwise clear  Abdomen:  Round, soft, non tender, +bs, continuous bladder irrigation  Musculoskeletal:  Warm and dry, no edema  LABS:  BMET  Recent Labs Lab 01/26/16 0513 01/27/16 0910 01/27/16 1854 01/27/16 2019 01/28/16 0536  NA 131* 133* 134* 136 135  K 4.5 4.2 4.4 4.5 4.1  CL 102 106  --   --  109  CO2 20* 18*  --   --  16*  BUN 123* 126*  --   --  115*  CREATININE 5.71* 5.92*  --   --  5.53*  GLUCOSE 197* 179*  --   --  126*    Electrolytes  Recent Labs Lab 01/26/16 0513 01/27/16  [id number] 01/28/16 0536  CALCIUM 8.2* 8.7* 8.2*  PHOS 4.1 4.7* 5.3*    CBC  Recent Labs Lab 01/26/16 0513 01/27/16 0910 01/27/16 1854 01/27/16 2019 01/28/16 0008  WBC 8.6 14.1*  --   --  13.3*  HGB 7.0* 9.1* 6.8* 8.5* 8.8*  HCT 20.5* 27.0* 20.0* 25.0* 26.1*  PLT 251 307  --   --  264    Coag's No results for input(s): APTT, INR in the last 168 hours.  Sepsis Markers No results for input(s): LATICACIDVEN, PROCALCITON, O2SATVEN in the last 168 hours.  ABG  Recent Labs Lab 01/27/16 1854 01/27/16 2019  PHART 7.218* 7.293*  PCO2ART 34.0* 27.6*  PO2ART 212.0* 88.0    Liver Enzymes  Recent Labs Lab 01/26/16 0513  01/27/16 0910 01/28/16 0536  ALBUMIN 2.0* 2.1* 2.3*    Cardiac Enzymes No results for input(s): TROPONINI, PROBNP in the last 168 hours.  Glucose  Recent Labs Lab 01/27/16 0607 01/27/16 1121 01/27/16 2001 01/27/16 2314 01/28/16 0405 01/28/16 0758  GLUCAP 213* 170* 211* 178* 122* 123*    Imaging Dg Chest Port 1 View  01/28/2016  CLINICAL DATA:  Weakness, infected pacemaker EXAM: PORTABLE CHEST 1 VIEW COMPARISON:  Portable chest x-ray of January 27, 2016 FINDINGS: The left lung is well-expanded and clear. On the right there is mild volume loss with increased density in the upper and lower lobes which is stable. There is no pneumothorax. There is biapical pleural thickening the heart is normal in size. There is dense calcification in the aortic arch. The sternal wires are intact. The right internal jugular venous catheter tip projects over the distal third of the SVC just above the cavoatrial junction. IMPRESSION: 1. Persistent infiltrate in the right upper and lower lobes. Small right pleural effusion. No definite pneumothorax. 2. The left lung is clear.  No CHF.  Aortic atherosclerosis. Electronically Signed   By: David  Martinique M.D.   On: 01/28/2016 07:11   Dg Chest Portable 1 View  01/27/2016  CLINICAL DATA:  Right IJ placement EXAM: PORTABLE CHEST 1 VIEW COMPARISON:  January 16, 2016 FINDINGS: A right IJ has been placed in the interval, probably terminating just within the right side of the atrium, approximately 2 cm inferior to the caval atrial junction. No pneumothorax. Opacity remains in the right mid lung, less focal in the interval. Opacity and effusion remains in the left lung base, also less focal in the interval. No other interval changes. IMPRESSION: 1. The distal tip of the new right IJ probably terminates approximately 2 cm below the caval atrial junction, just within the right side of the atrium. Recommend repositioning as clinically warranted. No pneumothorax. 2. The right-sided  pulmonary infiltrates are less focal in the interval but remain. There is probably a small amount of layering effusion on the right as well. Electronically Signed   By: Dorise Bullion III M.D   On: 01/27/2016 19:55     STUDIES:  2D echo 7/19>>> severe LVH, grade 1 diastolic dysfunction, pacer wire in RA with mobile echodensity, PA pressure 28mmgHg   CULTURES: BC x 2 7/7>>> MSSA BC x 2 7/8>>> neg   ANTIBIOTICS: Ancef 7/7>>>  Rifampin 7/8>>>  SIGNIFICANT EVENTS: 7/18 vascular surgery removal of infected pacemaker   LINES/TUBES: R IJ CVL 7/18>>>  DISCUSSION: 80yo male with multiple medical problems admitted 7/7 with MSSA bacteremia from infected pacer wire.  Underwent removal on 7/19 by vascular surgery.  On 7/19 developed significant hypotension with SBP 70's likely r/t anti-HTN  and pain medication.   ASSESSMENT / PLAN:  Hypotension - suspect r/t multiple antihypertensive and pain medication.  SBP currently 70's but has been 130's consistently prior.  PLAN -  510ml NS bolus now  Check CVP  Low dose levophed gtt - wean off as able  D/c anti-HTN  Check pct, lactate for completeness given previous bacteremia, pacemaker removal 7/18  AKI on CKD IV  PLAN -  Per renal   MSSA bacteremia - r/t infected pacer wire s/p removal PLAN -  abx Per ID    Rest per [facility].    Nickolas Madrid, NP 01/28/2016  12:29 PM Pager: [phone] or [phone]  Attending Note:  80 year old male with an infected pace maker that was removed 7/18 and was under observation in the ICU as SDU overflow.  Patient received hydralazine, coreg, clonidine, imdur and percocet and subsequently became hypotensive.  Patient had a CVP of 4.  Patient is protecting his airway and sounds clear on exam.  CXR that I reviewed myself showed no evidence of congestion.  DDx of septic vs medication induced shock.  Will hydrate first.  Start levophed.  Check procalcitonin and lactic acid.  Will continue to  monitor.  The patient is critically ill with multiple organ systems failure and requires high complexity decision making for assessment and support, frequent evaluation and titration of therapies, application of advanced monitoring technologies and extensive interpretation of multiple databases.   Critical Care Time devoted to patient care services described in this note is  35  Minutes. This time reflects time of care of this signee Dr Jennet Maduro. This critical care time does not reflect procedure time, or teaching time or supervisory time of PA/NP/Med student/Med Resident etc but could involve care discussion time.  Rush Farmer, M.D. [facility] Division Pulmonary/Critical Care Medicine. Pager: [phone]. After hours pager: [phone].

## 2016-01-28 NOTE — Progress Notes (Signed)
1 Day Post-Op Subjective: The patient was admitted with weakness and gross hematuria. Dr. Louis Meckel performed cystoscopy and noted a formed clot in the bladder without obvious tumor. He is currently on CBI draining well with improving hematuria. No bladder discomfort. Went to OR yesterday with vascular surgery. Received two units pRBCs yesterday with appropriate response 6.8--> 8.8.  Objective: Vital signs in last 24 hours: Temp:  [97.6 F (36.4 C)-98.7 F (37.1 C)] 98.4 F (36.9 C) (07/19 0400) Pulse Rate:  [76-93] 88 (07/19 0700) Resp:  [12-19] 19 (07/19 0700) BP: (122-179)/(54-85) 155/63 mmHg (07/19 0700) SpO2:  [97 %-100 %] 100 % (07/19 0700) Arterial Line BP: (180-223)/(64-79) 180/72 mmHg (07/18 2111) Weight:  [76.658 kg (169 lb)] 76.658 kg (169 lb) (07/19 0700)  Intake/Output from previous day: 07/18 0701 - 07/19 0700 In: 10572.5 [I.V.:3687.5; Blood:335; IV Piggyback:550] Out: 16500 [Urine:16200; Blood:300] Intake/Output this shift:    Physical Exam:  General:alert, cooperative and appears stated age GI: soft, non tender, normal bowel sounds, no palpable masses, no organomegaly, no inguinal hernia Male genitalia: no penile lesions or discharge no testicular masses no bladder distension noted Extremities: extremities normal, atraumatic, no cyanosis or edema  Foley draining pink urine on very slow CBI drip  Lab Results:  Recent Labs  01/27/16 1854 01/27/16 2019 01/28/16 0008  HGB 6.8* 8.5* 8.8*  HCT 20.0* 25.0* 26.1*   BMET  Recent Labs  01/27/16 0910  01/27/16 2019 01/28/16 0536  NA 133*  < > 136 135  K 4.2  < > 4.5 4.1  CL 106  --   --  109  CO2 18*  --   --  16*  GLUCOSE 179*  --   --  126*  BUN 126*  --   --  115*  CREATININE 5.92*  --   --  5.53*  CALCIUM 8.7*  --   --  8.2*  < > = values in this interval not displayed. No results for input(s): LABPT, INR in the last 72 hours. No results for input(s): LABURIN in the last 72 hours. Results for orders  placed or performed during the hospital encounter of 01/16/16  Blood culture (routine x 2)     Status: Abnormal   Collection Time: 01/16/16  1:00 PM  Result Value Ref Range Status   Specimen Description LEFT ANTECUBITAL  Final   Special Requests BOTTLES DRAWN AEROBIC AND ANAEROBIC 10CC  Final   Culture  Setup Time   Final    GRAM POSITIVE COCCI IN CLUSTERS IN BOTH AEROBIC AND ANAEROBIC BOTTLES CRITICAL RESULT CALLED TO, READ BACK BY AND VERIFIED WITH: J MARKLE 01/17/16 @ 0927 M [facility]    Culture (A)  Final    STAPHYLOCOCCUS AUREUS SUSCEPTIBILITIES PERFORMED ON PREVIOUS CULTURE WITHIN THE LAST 5 DAYS.    Report Status 01/19/2016 FINAL  Final  Blood culture (routine x 2)     Status: Abnormal   Collection Time: 01/16/16  1:06 PM  Result Value Ref Range Status   Specimen Description BLOOD LEFT HAND  Final   Special Requests AEROBIC BOTTLE ONLY 10CC  Final   Culture  Setup Time   Final    GRAM POSITIVE COCCI IN CLUSTERS AEROBIC BOTTLE ONLY CRITICAL RESULT CALLED TO, READ BACK BY AND VERIFIED WITH: J MARKLE 01/17/16 @ 0927 M VESTAL    Culture STAPHYLOCOCCUS AUREUS (A)  Final   Report Status 01/19/2016 FINAL  Final   Organism ID, Bacteria STAPHYLOCOCCUS AUREUS  Final      Susceptibility  Staphylococcus aureus - MIC*    CIPROFLOXACIN >=8 RESISTANT Resistant     ERYTHROMYCIN <=0.25 SENSITIVE Sensitive     GENTAMICIN <=0.5 SENSITIVE Sensitive     OXACILLIN 0.5 SENSITIVE Sensitive     TETRACYCLINE <=1 SENSITIVE Sensitive     VANCOMYCIN <=0.5 SENSITIVE Sensitive     TRIMETH/SULFA <=10 SENSITIVE Sensitive     CLINDAMYCIN <=0.25 SENSITIVE Sensitive     RIFAMPIN <=0.5 SENSITIVE Sensitive     Inducible Clindamycin NEGATIVE Sensitive     * STAPHYLOCOCCUS AUREUS  Blood Culture ID Panel (Reflexed)     Status: Abnormal   Collection Time: 01/16/16  1:06 PM  Result Value Ref Range Status   Enterococcus species NOT DETECTED NOT DETECTED Final   Vancomycin resistance NOT DETECTED NOT DETECTED  Final   Listeria monocytogenes NOT DETECTED NOT DETECTED Final   Staphylococcus species DETECTED (A) NOT DETECTED Final    Comment: CRITICAL RESULT CALLED TO, READ BACK BY AND VERIFIED WITH: J MARKLE 01/17/16 @ 0927 M VESTAL    Staphylococcus aureus DETECTED (A) NOT DETECTED Final    Comment: CRITICAL RESULT CALLED TO, READ BACK BY AND VERIFIED WITH: J MARKLE 01/17/16 @ 0927 M VESTAL    Methicillin resistance NOT DETECTED NOT DETECTED Final   Streptococcus species NOT DETECTED NOT DETECTED Final   Streptococcus agalactiae NOT DETECTED NOT DETECTED Final   Streptococcus pneumoniae NOT DETECTED NOT DETECTED Final   Streptococcus pyogenes NOT DETECTED NOT DETECTED Final   Acinetobacter baumannii NOT DETECTED NOT DETECTED Final   Enterobacteriaceae species NOT DETECTED NOT DETECTED Final   Enterobacter cloacae complex NOT DETECTED NOT DETECTED Final   Escherichia coli NOT DETECTED NOT DETECTED Final   Klebsiella oxytoca NOT DETECTED NOT DETECTED Final   Klebsiella pneumoniae NOT DETECTED NOT DETECTED Final   Proteus species NOT DETECTED NOT DETECTED Final   Serratia marcescens NOT DETECTED NOT DETECTED Final   Carbapenem resistance NOT DETECTED NOT DETECTED Final   Haemophilus influenzae NOT DETECTED NOT DETECTED Final   Neisseria meningitidis NOT DETECTED NOT DETECTED Final   Pseudomonas aeruginosa NOT DETECTED NOT DETECTED Final   Candida albicans NOT DETECTED NOT DETECTED Final   Candida glabrata NOT DETECTED NOT DETECTED Final   Candida krusei NOT DETECTED NOT DETECTED Final   Candida parapsilosis NOT DETECTED NOT DETECTED Final   Candida tropicalis NOT DETECTED NOT DETECTED Final  Culture, sputum-assessment     Status: None   Collection Time: 01/16/16 10:24 PM  Result Value Ref Range Status   Specimen Description EXPECTORATED SPUTUM  Final   Special Requests NONE  Final   Sputum evaluation   Final    THIS SPECIMEN IS ACCEPTABLE. RESPIRATORY CULTURE REPORT TO FOLLOW.   Report  Status 01/17/2016 FINAL  Final  Culture, respiratory (NON-Expectorated)     Status: None   Collection Time: 01/16/16 10:24 PM  Result Value Ref Range Status   Specimen Description EXPECTORATED SPUTUM  Final   Special Requests NONE  Final   Gram Stain   Final    RARE SQUAMOUS EPITHELIAL CELLS PRESENT ABUNDANT WBC PRESENT,BOTH PMN AND MONONUCLEAR FEW GRAM POSITIVE COCCI IN PAIRS IN CLUSTERS    Culture Consistent with normal respiratory flora.  Final   Report Status 01/19/2016 FINAL  Final  Culture, blood (routine x 2)     Status: None   Collection Time: 01/17/16  9:50 AM  Result Value Ref Range Status   Specimen Description BLOOD BLOOD LEFT ARM  Final   Special Requests BOTTLES DRAWN AEROBIC ONLY  5CC  Final   Culture NO GROWTH 5 DAYS  Final   Report Status 01/22/2016 FINAL  Final  Culture, blood (routine x 2)     Status: None   Collection Time: 01/17/16  9:50 AM  Result Value Ref Range Status   Specimen Description BLOOD LEFT ANTECUBITAL  Final   Special Requests BOTTLES DRAWN AEROBIC ONLY 5CC  Final   Culture NO GROWTH 5 DAYS  Final   Report Status 01/22/2016 FINAL  Final  Acid Fast Smear (AFB)     Status: None   Collection Time: 01/17/16 11:26 AM  Result Value Ref Range Status   AFB Specimen Processing Concentration  Final   Acid Fast Smear Negative  Final    Comment: (NOTE) Performed At: [facility] [place], [place] [id number] Lindon Romp MD [id number]    Source (AFB) SPUTUM  Final  Body fluid culture     Status: None   Collection Time: 01/19/16  4:37 PM  Result Value Ref Range Status   Specimen Description FLUID SYNOVIAL WRIST LEFT  Final   Special Requests NONE  Final   Gram Stain   Final    ABUNDANT WBC PRESENT,BOTH PMN AND MONONUCLEAR NO ORGANISMS SEEN    Culture NO GROWTH 3 DAYS  Final   Report Status 01/22/2016 FINAL  Final  Aerobic/Anaerobic Culture (surgical/deep wound)     Status: None   Collection Time: 01/21/16  6:52 PM   Result Value Ref Range Status   Specimen Description WOUND LEFT WRIST  Final   Special Requests NONE  Final   Gram Stain   Final    WBC PRESENT,BOTH PMN AND MONONUCLEAR NO ORGANISMS SEEN    Culture No growth aerobically or anaerobically.  Final   Report Status 01/26/2016 FINAL  Final  Surgical pcr screen     Status: Abnormal   Collection Time: 01/27/16  1:12 PM  Result Value Ref Range Status   MRSA, PCR NEGATIVE NEGATIVE Final   Staphylococcus aureus POSITIVE (A) NEGATIVE Final    Comment:        The Xpert SA Assay (FDA approved for NASAL specimens in patients over 1 years of age), is one component of a comprehensive surveillance program.  Test performance has been validated by [facility] for patients greater than or equal to 20 year old. It is not intended to diagnose infection nor to guide or monitor treatment.     Studies/Results: Dg Chest Port 1 View  01/28/2016  CLINICAL DATA:  Weakness, infected pacemaker EXAM: PORTABLE CHEST 1 VIEW COMPARISON:  Portable chest x-ray of January 27, 2016 FINDINGS: The left lung is well-expanded and clear. On the right there is mild volume loss with increased density in the upper and lower lobes which is stable. There is no pneumothorax. There is biapical pleural thickening the heart is normal in size. There is dense calcification in the aortic arch. The sternal wires are intact. The right internal jugular venous catheter tip projects over the distal third of the SVC just above the cavoatrial junction. IMPRESSION: 1. Persistent infiltrate in the right upper and lower lobes. Small right pleural effusion. No definite pneumothorax. 2. The left lung is clear.  No CHF.  Aortic atherosclerosis. Electronically Signed   By: David  Martinique M.D.   On: 01/28/2016 07:11   Dg Chest Portable 1 View  01/27/2016  CLINICAL DATA:  Right IJ placement EXAM: PORTABLE CHEST 1 VIEW COMPARISON:  January 16, 2016 FINDINGS: A right IJ has been  placed in the interval, probably  terminating just within the right side of the atrium, approximately 2 cm inferior to the caval atrial junction. No pneumothorax. Opacity remains in the right mid lung, less focal in the interval. Opacity and effusion remains in the left lung base, also less focal in the interval. No other interval changes. IMPRESSION: 1. The distal tip of the new right IJ probably terminates approximately 2 cm below the caval atrial junction, just within the right side of the atrium. Recommend repositioning as clinically warranted. No pneumothorax. 2. The right-sided pulmonary infiltrates are less focal in the interval but remain. There is probably a small amount of layering effusion on the right as well. 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 Please start continuous bladder irrigation on slow drip and titrate drip to light pink urine. Pending even further improve in hematuria will likely clamp CBI later today versus tomorrow. 2. Please irrigate PRN for clots. Urology to continue to follow   LOS: 12 days   Lolita Rieger 01/28/2016, 7:48 AM

## 2016-01-28 NOTE — Progress Notes (Signed)
Progress Note    [name]  BOF:[id number] DOB: February 05, 1936  DOA: 01/16/2016 PCP: [name], MD    Brief Narrative:   [name]. is an 80 y.o. male with a PMH of paroxysmal atrial fibrillation/pacemaker not on anticoagulation secondary to history of GI bleed, CAD, stage IV CKD, CAD, hypertension,Liddle's syndrome, myelodysplastic syndrome, PVD and hyperlipidemia who was admitted 01/16/16 with multifocal pneumonia complicated by coagulopathy and hematuria. Blood cultures were positive for MSSA. Patient subsequently developed severe wrist pain and underwent arthrotomy of left wrist joint 01/21/16. Pseudogout suspected given negative synovial fluid cultures. Echocardiogram done which showed echodensity on RA lead, s/p PPM system extraction 7/18 by Dr. Lovena Le (Required vascular surgery to retrieve RV lead tip from RFV)  Assessment/Plan:   Principal Problem:    Staphylococcus aureus bacteremia with pacemaker endocarditis, possible septic arthritis and right lower lobe pneumonia/Multifocal pneumonia - Per ID recs: Current guidelines state that he should be safe to replace a new pacemaker 14 days after the first negative blood culture (that would be 01/31/2016). He will need a total of 4 weeks of antibiotic therapy through 02/12/2016.He is currently on rifampin and Ancef, which is being renally adjusted per nephrology recommendations. - s/p PPM system extraction 7/18 by Dr. Lovena Le (Required vascular surgery to retrieve RV lead tip from RFV) - TEE done 01/26/16 with no evidence of vegetation,  - Palliative care consultation requested given overall failure to thrive, poor prognosis with progressive renal failure (does not want to consider HD and would likely not be a good candidate.  Hypotension - patient is hypotensive, discontinue antihypertensive medication include Coreg, Imdur, hydralazine and clonidine, will give a fluid bolus, check stat CBC, transfuse as needed, PICC  and were consulted for possible need of pressors nor respond to IV fluids bolus, will check CVP.  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 5 cm masslike area consistent with a blood clot.  -  Evaluated by urology with a cystoscopy performed, status post bladder clot evacuation and bladder irrigation. Continues to have clots.   Coronary artery disease/chronic diastolic CHF  - Status post CABG. Aspirin/Plavix currently on hold secondary to hematuria and hemoptysis.  - Continue Lipitor,  - will hold Imdur and Coreg given hypotension.  - No evidence of heart failure exacerbation. Diuretics currently on hold.  PAF (paroxysmal atrial fibrillation) (HCC) status post pacemaker for sick sinus syndrome - CHA2DS2Vasc is at least 4, but not felt to be a candidate for anticoagulation given history of GI bleeding. Continue amiodarone.  Acute renal failure superimposed on stage IV chronic kidney disease/renal artery stenosis - Baseline creatinine 3.8. Creatinine rising. Differential remains broad with ATN, interstitial nephritis, postinfectious GN or progressive CKD all possible.  - Continue management per nephrology.  Wrist swelling secondary to probable pseudogout, synovial fluid cultures pending - Seen by orthopedics, status post incision and drainage 01/21/16. Operative synovial fluid Gram stain was negative for organisms. CPPD crystals noted. Findings most consistent with pseudogout. Continue prednisone. Follow-up cultures. Will follow-up with Dr. Grandville Silos.  Liddle's syndrome Monitor for hypokalemia and high blood pressure.  Myelodysplastic syndrome/anemia of chronic disease - Gets Epogen injections every 2 weeks at the cancer center. Hemoglobin trending down. Continue to monitor. Hemoglobin is 7 today, given 1 unit of PRBCs 01/26/16. Resume Epogen. As we'll transfuse 1 unit PRBC  Diabetes mellitus with peripheral artery disease Diet  controlled. CBGs [id number]. Currently being managed with SSI, moderate scale with 4  units of meal coverage. Steroids may make glycemic control harder to achieve. Increase meal coverage to 6 units.  Gastroparesis  Hypertension - Patient is hypotensive, please see above  Prostate cancer Gets Lupron injections every 4 months. S/P external beam radiation.   Family Communication/Anticipated D/C date and plan/Code Status   DVT prophylaxis: SCDs ordered. Code Status: Full Code.  Family Communication: Cost with patient, none at bedside Disposition Plan:  likely several more days given current sepsis/pacemaker endocarditis.   Medical Consultants:    Cardiology  Orthopedic Surgery  Infectious Disease  Nephrology  Urology  PCCM   Procedures:   - 1 unit PRBC transfusion on 7/17, and 1 unit on 7/18 - s/p PPM system extraction 7/18 by Dr. Lovena Le (Required vascular surgery to retrieve RV lead tip from RFV)   2 D Echo 01/24/16:  Dynamic obstruction with peak velocity of 351 cm/sec. no regional wall motion abnormalities. Grade 1 diastolic dysfunction. Mobile echodensity in the right atrium attached to pacer wire. Mild systolic pulmonary artery hypertension.  Dg Chest 2 View  01/16/2016  CLINICAL DATA:  Chest pain, cough for 2 days EXAM: CHEST  2 VIEW COMPARISON:  CT chest 04/03/2014 FINDINGS: Cardiomediastinal silhouette is unremarkable. Status post median sternotomy. Dual lead cardiac pacemaker with leads in right atrium and right ventricle. There is streaky airspace opacification in right base infrahilar region and right upper lobe peripheral perihilar. Findings highly suspicious for pneumonia. Probable small right pleural effusion. Left lung is clear. IMPRESSION: There is streaky airspace opacification in right base infrahilar region and right upper lobe peripheral perihilar. Findings highly suspicious for pneumonia. Followup PA and lateral chest X-ray is recommended in 3-4 weeks following  trial of antibiotic therapy to ensure resolution and exclude underlying malignancy. Probable small right pleural effusion. Electronically Signed   By: Lahoma Crocker M.D.   On: 01/16/2016 13:58   Dg Wrist 2 Views Left  01/18/2016  CLINICAL DATA:  Acute onset of left wrist pain.  Initial encounter. EXAM: LEFT WRIST - 2 VIEW COMPARISON:  None. FINDINGS: There is no evidence of fracture or dislocation. There is joint space narrowing along the radiocarpal joint, and at the radial aspect of the carpal rows. This resolves in mild chronic deformity of the scaphoid and lunate, with expansion of the scapholunate distance to 5 mm. Scattered vascular calcifications are seen. No additional soft tissue abnormalities are characterized on radiograph. IMPRESSION: 1. No evidence of fracture or dislocation. 2. Degenerative joint space narrowing along the radiocarpal joints, and at the radial aspect of the carpal rows. This results in mild chronic deformity of the scaphoid and lunate, with expansion of the scapholunate distance to 5 mm, reflecting scapholunate dissociation. Electronically Signed   By: Garald Balding M.D.   On: 01/18/2016 16:57   Ct Chest Wo Contrast  01/16/2016  CLINICAL DATA:  Hemoptysis, cough.  Symptoms since Monday. EXAM: CT CHEST WITHOUT CONTRAST TECHNIQUE: TECHNIQUE Multidetector CT imaging of the chest was performed following the standard protocol without IV contrast. COMPARISON:  Chest radiograph 01/16/2016, CT 04/03/2014 FINDINGS: Cardiovascular: Calcification of the thoracic aorta. Coronary bypass graft. Pacer wires in the RIGHT heart. No pericardial fluid. Mediastinum/Nodes: No axillary or supraclavicular adenopathy. No mediastinal hilar adenopathy. Esophagus normal. Lungs/Pleura: There is consolidative airspace disease in the RIGHT lower lobe with air bronchograms. A similar pattern in the posterior aspect the RIGHT upper lobe. Smaller pattern of consolidation in the LEFT lower lobe. Findings are  consistent with multifocal pneumonia. Upper Abdomen: Limited view of the liver,  kidneys, pancreas are unremarkable. Normal adrenal glands. Musculoskeletal: No aggressive osseous lesion.  Midline sternotomy. IMPRESSION: 1. Multifocal pneumonia with most dense infection in RIGHT lower lobe. 2. Coronary artery calcification and aortic atherosclerotic calcification. Electronically Signed   By: Suzy Bouchard M.D.   On: 01/16/2016 16:32   US Renal  01/17/2016  CLINICAL DATA:  80 year old male with microscopic hematuria. Personal history of prostate cancer. Initial encounter. EXAM: RENAL / URINARY TRACT ULTRASOUND COMPLETE COMPARISON:  CT Abdomen and Pelvis 08/30/2015, and earlier. Right upper quadrant ultrasound from today reported separately. FINDINGS: Right Kidney: Length: 12.3 cm. Echogenicity within normal limits. No mass or hydronephrosis visualized. Left Kidney: Length: 12.0 cm. Echogenicity within normal limits. No mass or hydronephrosis visualized. Bladder: 5 cm area which is dependent, complex, and mass like (image 15), however, there is no internal vascularity detected with brief color Doppler interrogation. Elsewhere the urinary bladder appears normal. Prostate measured to be 5.2 x 3.1 x 4.3 cm. IMPRESSION: 1. Abnormal 5 cm complex, mass-like area of dependent echogenicity in the urinary bladder. However, lack of internal vascularity on Doppler suggests this is more likely blood clot rather than a bladder tumor. Further evaluation recommended. 2. Normal for age sonographic appearance of both kidneys. Electronically Signed   By: Genevie Ann M.D.   On: 01/17/2016 08:01   US Renal  01/09/2016  CLINICAL DATA:  Two weeks of hematuria ; history of prostate enlargement EXAM: RENAL / URINARY TRACT ULTRASOUND COMPLETE COMPARISON:  Abdominal pelvic CT scan of July 30, 2015 FINDINGS: Right Kidney: Length: 11.5 cm. The renal cortical echotexture is equal to or slightly greater than that of the adjacent liver. There  is no hydronephrosis nor discrete mass. Left Kidney: Length: 10.3 cm. The renal cortical echotexture is mildly increased similar to that on the right. There is no hydronephrosis. Bladder: There is an irregular mass in the posterior aspect of the urinary bladder wall measuring 2.5 x 2 x 4.4 cm. This appears separate from the more anteriorly and inferiorly positioned prostate gland. The prostate gland itself does produce a mild impression upon the urinary bladder base. IMPRESSION: 1. Posterior bladder wall mass worrisome for malignancy. This is separate from the mildly enlarged prostate gland. 2. Increased renal cortical echotexture bilaterally consistent with medical renal disease. There is no hydronephrosis. Electronically Signed   By: David  Martin[place] M.D.   On: 01/09/2016 14:44   Ir Fluoro Guide Cv Line Left  01/25/2016  INDICATION: Bacteremia due to Staph aureus infection. History of chronic renal insufficiency. Request made for placement of a tunneled PICC line for antibiotic administration. EXAM: TUNNELED PICC LINE WITH ULTRASOUND AND FLUOROSCOPIC GUIDANCE MEDICATIONS: The patient is currently admitted to the hospital and receiving intravenous antibiotics. The antibiotic was given in an appropriate time interval prior to skin puncture. ANESTHESIA/SEDATION: Moderate (conscious) sedation was employed during this procedure. A total of Versed 0.5 mg and Fentanyl 25 mcg was administered intravenously. Moderate Sedation Time: 33 minutes. The patient's level of consciousness and vital signs were monitored continuously by radiology nursing throughout the procedure under my direct supervision. COMPLICATIONS: None immediate. PROCEDURE: Informed written consent was obtained from the patient after a discussion of the risks, benefits, and alternatives to treatment. Questions regarding the procedure were encouraged and answered. Given the presence of the right anterior chest wall pacemaker, the decision was made to place a  left internal jugular approach dual lumen PICC line. As such, the left neck and chest were prepped with chlorhexidine in a sterile fashion, and a  sterile drape was applied covering the operative field. Maximum barrier sterile technique with sterile gowns and gloves were used for the procedure. A timeout was performed prior to the initiation of the procedure. After creating a small venotomy incision, a micropuncture kit was utilized to access the left internal jugular vein under direct, real-time ultrasound guidance after the overlying soft tissues were anesthetized with 1% lidocaine with epinephrine. Ultrasound image documentation was performed. Despite several attempts, there was difficulty advancing the micro wire centrally. As such, a limited central venogram was performed via the inner 3 French sheath from the micropuncture kit demonstrating patency though marked tortuosity of the central aspect of the left internal jugular vein. Ultimately, a Nitrex wire was able to be advanced centrally. The microwire was kinked to measure appropriate catheter length. The micropuncture sheath was exchanged for a peel-away sheath over a guidewire. Initially, a 5 [place] dual lumen tunneled PICC measuring 28 cm was tunneled in a retrograde fashion from the anterior chest wall to the venotomy incision. Unfortunately, the initial catheter was noted to retract within the tortuous left innominate vein with with tip terminating at the level of the SVC. As such, the existing PICC line was cannulated with a GT Glidewire which was advanced to the level of the IVC. Under intermittent fluoroscopic guidance, the existing 28 cm tunneled PICC line was exchanged for a new slightly longer 31 cm dual lumen tunneled PICC line with tip ultimately terminating within the superior aspect the right atrium. Final catheter positioning was confirmed and documented with a spot radiographic image. The catheter aspirates and flushes normally. The catheter  was flushed with appropriate volume heparin dwells. The catheter exit site was secured with a 0-Prolene retention suture. The venotomy incision was closed with Dermabond and Steri-strips. Dressings were applied. The patient tolerated the procedure well without immediate post procedural complication. FINDINGS: After catheter placement, the tip lies within the superior cavoatrial junction. The catheter aspirates and flushes normally and is ready for immediate use. IMPRESSION: Successful placement of 31 cm dual lumen tunneled PICC catheter via the left internal jugular vein with tip terminating at the superior caval atrial junction. The catheter is ready for immediate use. Electronically Signed   By: Sandi Mariscal M.D.   On: 01/25/2016 13:51   Ir US Guide Vasc Access Left  01/25/2016  INDICATION: Bacteremia due to Staph aureus infection. History of chronic renal insufficiency. Request made for placement of a tunneled PICC line for antibiotic administration. EXAM: TUNNELED PICC LINE WITH ULTRASOUND AND FLUOROSCOPIC GUIDANCE MEDICATIONS: The patient is currently admitted to the hospital and receiving intravenous antibiotics. The antibiotic was given in an appropriate time interval prior to skin puncture. ANESTHESIA/SEDATION: Moderate (conscious) sedation was employed during this procedure. A total of Versed 0.5 mg and Fentanyl 25 mcg was administered intravenously. Moderate Sedation Time: 33 minutes. The patient's level of consciousness and vital signs were monitored continuously by radiology nursing throughout the procedure under my direct supervision. COMPLICATIONS: None immediate. PROCEDURE: Informed written consent was obtained from the patient after a discussion of the risks, benefits, and alternatives to treatment. Questions regarding the procedure were encouraged and answered. Given the presence of the right anterior chest wall pacemaker, the decision was made to place a left internal jugular approach dual lumen  PICC line. As such, the left neck and chest were prepped with chlorhexidine in a sterile fashion, and a sterile drape was applied covering the operative field. Maximum barrier sterile technique with sterile gowns and gloves were used for  the procedure. A timeout was performed prior to the initiation of the procedure. After creating a small venotomy incision, a micropuncture kit was utilized to access the left internal jugular vein under direct, real-time ultrasound guidance after the overlying soft tissues were anesthetized with 1% lidocaine with epinephrine. Ultrasound image documentation was performed. Despite several attempts, there was difficulty advancing the micro wire centrally. As such, a limited central venogram was performed via the inner 3 French sheath from the micropuncture kit demonstrating patency though marked tortuosity of the central aspect of the left internal jugular vein. Ultimately, a Nitrex wire was able to be advanced centrally. The microwire was kinked to measure appropriate catheter length. The micropuncture sheath was exchanged for a peel-away sheath over a guidewire. Initially, a 5 [place] dual lumen tunneled PICC measuring 28 cm was tunneled in a retrograde fashion from the anterior chest wall to the venotomy incision. Unfortunately, the initial catheter was noted to retract within the tortuous left innominate vein with with tip terminating at the level of the SVC. As such, the existing PICC line was cannulated with a GT Glidewire which was advanced to the level of the IVC. Under intermittent fluoroscopic guidance, the existing 28 cm tunneled PICC line was exchanged for a new slightly longer 31 cm dual lumen tunneled PICC line with tip ultimately terminating within the superior aspect the right atrium. Final catheter positioning was confirmed and documented with a spot radiographic image. The catheter aspirates and flushes normally. The catheter was flushed with appropriate volume heparin  dwells. The catheter exit site was secured with a 0-Prolene retention suture. The venotomy incision was closed with Dermabond and Steri-strips. Dressings were applied. The patient tolerated the procedure well without immediate post procedural complication. FINDINGS: After catheter placement, the tip lies within the superior cavoatrial junction. The catheter aspirates and flushes normally and is ready for immediate use. IMPRESSION: Successful placement of 31 cm dual lumen tunneled PICC catheter via the left internal jugular vein with tip terminating at the superior caval atrial junction. The catheter is ready for immediate use. Electronically Signed   By: Simonne Come M.D.   On: 01/25/2016 13:51   Dg Chest Port 1 View  01/28/2016  CLINICAL DATA:  Weakness, infected pacemaker EXAM: PORTABLE CHEST 1 VIEW COMPARISON:  Portable chest x-ray of January 27, 2016 FINDINGS: The left lung is well-expanded and clear. On the right there is mild volume loss with increased density in the upper and lower lobes which is stable. There is no pneumothorax. There is biapical pleural thickening the heart is normal in size. There is dense calcification in the aortic arch. The sternal wires are intact. The right internal jugular venous catheter tip projects over the distal third of the SVC just above the cavoatrial junction. IMPRESSION: 1. Persistent infiltrate in the right upper and lower lobes. Small right pleural effusion. No definite pneumothorax. 2. The left lung is clear.  No CHF.  Aortic atherosclerosis. Electronically Signed   By: David  [place] M.D.   On: 01/28/2016 07:11   Dg Chest Portable 1 View  01/27/2016  CLINICAL DATA:  Right IJ placement EXAM: PORTABLE CHEST 1 VIEW COMPARISON:  January 16, 2016 FINDINGS: A right IJ has been placed in the interval, probably terminating just within the right side of the atrium, approximately 2 cm inferior to the caval atrial junction. No pneumothorax. Opacity remains in the right mid lung, less  focal in the interval. Opacity and effusion remains in the left lung base, also less focal  in the interval. No other interval changes. IMPRESSION: 1. The distal tip of the new right IJ probably terminates approximately 2 cm below the caval atrial junction, just within the right side of the atrium. Recommend repositioning as clinically warranted. No pneumothorax. 2. The right-sided pulmonary infiltrates are less focal in the interval but remain. There is probably a small amount of layering effusion on the right as well. Electronically Signed   By: Dorise Bullion III M.D   On: 01/27/2016 19:55   Dg Fluoro Guided Needle Plc Aspiration/injection Loc  01/21/2016  CLINICAL DATA:  Left wrist pain and swelling.  Sepsis. EXAM: LEFT WRIST ASPIRATION UNDER FLUOROSCOPY FLUOROSCOPY TIME:  Fluoroscopy Time (in minutes and seconds): 24 SECONDS PROCEDURE: Overlying skin prepped with Betadine, draped in the usual sterile fashion, and infiltrated locally with buffered Lidocaine. 20 gauge needle advanced into the radiocarpal joint under direct fluoroscopic visualization. Approximately 3 cc of bloody fluid was aspirated from the joint and overlying soft tissues. IMPRESSION: Left radiocarpal joint aspiration performed without immediate complication. [name] with tolerated the procedure well. Technically successful  hip injection under fluoroscopy. Electronically Signed   By: Lorriane Shire M.D.   On: 01/21/2016 08:10   US Abdomen Limited Ruq  01/17/2016  CLINICAL DATA:  80 year old male with abnormal LFTs. Prior cholecystectomy. Pneumonia. Initial encounter. EXAM: US ABDOMEN LIMITED - RIGHT UPPER QUADRANT COMPARISON:  Noncontrast chest CT 01/16/2016. CT Abdomen and Pelvis 08/30/2015 and earlier FINDINGS: Gallbladder: Surgically absent Common bile duct: Diameter: 6 mm, normal Liver: Chronic 2.6 cm left hepatic lobe cyst (image 18) does demonstrate mild wall thickening, however, this lesion is stable on multiple prior CTs back  to 2011. Similar 3.1 cm right lobe cyst, also not significantly changed since 2011. Underlying hepatic echotexture is within normal limits. No intrahepatic biliary ductal dilatation is evident. No new liver lesion identified. Other findings: Negative visible right kidney. No free fluid identified. IMPRESSION: No acute liver or biliary findings. 2-3 cm liver cysts which are mildly thick walled but benign, having not significantly changed since 2011. Electronically Signed   By: Genevie Ann M.D.   On: 01/17/2016 07:56    Anti-Infectives:   Azithromycin 01/16/16---> 01/17/16 Rocephin 01/16/16---> 01/17/16 Ancef 01/17/16---> Rifampin 01/17/16 --->  Subjective:   [name] D Veron Sr. continues to feel weak and dizzy.    Objective:    Filed Vitals:   01/28/16 1000 01/28/16 1100 01/28/16 1150 01/28/16 1200  BP: 1'44/65 89/46 64/40 '$ 76/41  Pulse: 86 80 81 73  Temp:   98.1 F (36.7 C)   TempSrc:   Oral   Resp: '18 10 15 10  '$ Height:      Weight:      SpO2: 100% 100% 100% 100%    Intake/Output Summary (Last 24 hours) at 01/28/16 1211 Last data filed at 01/28/16 1200  Gross per 24 hour  Intake 11272.5 ml  Output  13675 ml  Net -2402.5 ml   Filed Weights   01/25/16 0534 01/26/16 0628 01/28/16 0700  Weight: 76.25 kg (168 lb 1.6 oz) 72.394 kg (159 lb 9.6 oz) 76.658 kg (169 lb)    Exam: General exam: Weak,Laying in bed Respiratory system: Clear to auscultation. Respiratory effort normal. Cardiovascular system: HSIR/tachy. No JVD,  rubs, gallops or clicks. Grade 2/6 SEM. Gastrointestinal system: Abdomen is nondistended, soft and nontender. No organomegaly or masses felt. Normal bowel sounds heard. Central nervous system: Mildly lethargic. No focal neurological deficits. Extremities: Left hand markedly erythematous/swollen and tender with limited range of motion  of fingers. Skin: No rashes, lesions or ulcers Psychiatry: Judgement and insight appear normal. Mood & affect appropriate.   Data Reviewed:    I have personally reviewed following labs and imaging studies:  Labs: Basic Metabolic Panel:  Recent Labs Lab 01/24/16 0317 01/25/16 0311 01/26/16 0513 01/27/16 0910 01/27/16 1854 01/27/16 2019 01/28/16 0536  NA 131* 130* 131* 133* 134* 136 135  K 4.2 4.3 4.5 4.2 4.4 4.5 4.1  CL 102 101 102 106  --   --  109  CO2 20* 20* 20* 18*  --   --  16*  GLUCOSE 192* 154* 197* 179*  --   --  126*  BUN 100* 114* 123* 126*  --   --  115*  CREATININE 4.20* 5.12* 5.71* 5.92*  --   --  5.53*  CALCIUM 8.7* 8.6* 8.2* 8.7*  --   --  8.2*  PHOS  --   --  4.1 4.7*  --   --  5.3*   GFR Estimated Creatinine Clearance: 11.4 mL/min (by C-G formula based on Cr of 5.53).  CBC:  Recent Labs Lab 01/24/16 0317 01/25/16 0311 01/26/16 0513 01/27/16 0910 01/27/16 1854 01/27/16 2019 01/28/16 0008  WBC 10.0 8.6 8.6 14.1*  --   --  13.3*  HGB 8.1* 7.3* 7.0* 9.1* 6.8* 8.5* 8.8*  HCT 23.1* 21.3* 20.5* 27.0* 20.0* 25.0* 26.1*  MCV 91.7 92.6 93.2 92.5  --   --  91.9  PLT 212 238 251 307  --   --  264   CBG:  Recent Labs Lab 01/27/16 1121 01/27/16 2001 01/27/16 2314 01/28/16 0405 01/28/16 0758  GLUCAP 170* 211* 178* 122* 123*   Microbiology Recent Results (from the past 240 hour(s))  Body fluid culture     Status: None   Collection Time: 01/19/16  4:37 PM  Result Value Ref Range Status   Specimen Description FLUID SYNOVIAL WRIST LEFT  Final   Special Requests NONE  Final   Gram Stain   Final    ABUNDANT WBC PRESENT,BOTH PMN AND MONONUCLEAR NO ORGANISMS SEEN    Culture NO GROWTH 3 DAYS  Final   Report Status 01/22/2016 FINAL  Final  Aerobic/Anaerobic Culture (surgical/deep wound)     Status: None   Collection Time: 01/21/16  6:52 PM  Result Value Ref Range Status   Specimen Description WOUND LEFT WRIST  Final   Special Requests NONE  Final   Gram Stain   Final    WBC PRESENT,BOTH PMN AND MONONUCLEAR NO ORGANISMS SEEN    Culture No growth aerobically or anaerobically.  Final    Report Status 01/26/2016 FINAL  Final  Surgical pcr screen     Status: Abnormal   Collection Time: 01/27/16  1:12 PM  Result Value Ref Range Status   MRSA, PCR NEGATIVE NEGATIVE Final   Staphylococcus aureus POSITIVE (A) NEGATIVE Final    Comment:        The Xpert SA Assay (FDA approved for NASAL specimens in patients over 24 years of age), is one component of a comprehensive surveillance program.  Test performance has been validated by [facility] for patients greater than or equal to 54 year old. It is not intended to diagnose infection nor to guide or monitor treatment.     Radiology: Dg Chest Port 1 View  01/28/2016  CLINICAL DATA:  Weakness, infected pacemaker EXAM: PORTABLE CHEST 1 VIEW COMPARISON:  Portable chest x-ray of January 27, 2016 FINDINGS: The left lung is well-expanded and  clear. On the right there is mild volume loss with increased density in the upper and lower lobes which is stable. There is no pneumothorax. There is biapical pleural thickening the heart is normal in size. There is dense calcification in the aortic arch. The sternal wires are intact. The right internal jugular venous catheter tip projects over the distal third of the SVC just above the cavoatrial junction. IMPRESSION: 1. Persistent infiltrate in the right upper and lower lobes. Small right pleural effusion. No definite pneumothorax. 2. The left lung is clear.  No CHF.  Aortic atherosclerosis. Electronically Signed   By: David  Martinique M.D.   On: 01/28/2016 07:11   Dg Chest Portable 1 View  01/27/2016  CLINICAL DATA:  Right IJ placement EXAM: PORTABLE CHEST 1 VIEW COMPARISON:  January 16, 2016 FINDINGS: A right IJ has been placed in the interval, probably terminating just within the right side of the atrium, approximately 2 cm inferior to the caval atrial junction. No pneumothorax. Opacity remains in the right mid lung, less focal in the interval. Opacity and effusion remains in the left lung base, also less  focal in the interval. No other interval changes. IMPRESSION: 1. The distal tip of the new right IJ probably terminates approximately 2 cm below the caval atrial junction, just within the right side of the atrium. Recommend repositioning as clinically warranted. No pneumothorax. 2. The right-sided pulmonary infiltrates are less focal in the interval but remain. There is probably a small amount of layering effusion on the right as well. Electronically Signed   By: Dorise Bullion III M.D   On: 01/27/2016 19:55    Medications:   . sodium chloride   Intravenous Once  . amiodarone  100 mg Oral Daily  . atorvastatin  80 mg Oral QHS  . Calcifediol ER  30 mcg Oral QHS  .  ceFAZolin (ANCEF) IV  1 g Intravenous Q24H  . Chlorhexidine Gluconate Cloth  6 each Topical Daily  . insulin aspart  0-5 Units Subcutaneous QHS  . insulin aspart  0-9 Units Subcutaneous TID WC  . insulin aspart  6 Units Subcutaneous TID WC  . multivitamin with minerals  1 tablet Oral q morning - 10a  . mupirocin ointment  1 application Nasal BID  . polyethylene glycol  17 g Oral Daily  . predniSONE  50 mg Oral Q breakfast  . rifampin  600 mg Oral Daily  . sodium chloride  250 mL Intravenous Once  . tamsulosin  0.4 mg Oral QPC supper   Continuous Infusions: . sodium chloride 50 mL/hr at 01/28/16 1200  . sodium chloride irrigation      Time spent: 35 minutes.  The patient is medically complex with multiple co-morbidities and is at high risk for clinical deterioration and requires high complexity decision making and coordination of care with multiple specialists.    LOS: 12 days   [facility], Aliza Moret MD [facility] Pager [phone].    *Please refer to [email or web address], password [email or web address] to get updated schedule on who will round on this patient, as hospitalists switch teams weekly. If 7PM-7AM, please contact night-coverage at [email or web address], password [email or web address] for any overnight needs.  01/28/2016, 12:11 PM

## 2016-01-28 NOTE — Progress Notes (Signed)
Pharmacy Antibiotic Note  [name]. is a 80 y.o. male admitted on 01/16/2016 with multifocal pna per chest CT and  MSSA bacteremia. Pacemaker was removed 7/18.  Pharmacy was consulted on 01/17/16 for cefazolin dosing (als on rifampin). Per ID plans to continue ancef until 02/12/2016 -WBC= 13.3, SCr= 5.53, CrCl ~ 10  Plan: -Continue ancef at 1gm IV q24h -Will follow renal trend and patient progress  Height: 5\' 10"  (177.8 cm) Weight: 169 lb (76.658 kg) IBW/kg (Calculated) : 73  Temp (24hrs), Avg:98.4 F (36.9 C), Min:97.8 F (36.6 C), Max:99.1 F (37.3 C)   Recent Labs Lab 01/24/16 0317 01/25/16 0311 01/26/16 0513 01/27/16 0910 01/28/16 0008 01/28/16 0536  WBC 10.0 8.6 8.6 14.1* 13.3*  --   CREATININE 4.20* 5.12* 5.71* 5.92*  --  5.53*    Estimated Creatinine Clearance: 11.4 mL/min (by C-G formula based on Cr of 5.53).    Allergies  Allergen Reactions  . Nsaids Other (See Comments)    GI issue  . Ibuprofen Other (See Comments)    GI Issues  . Ace Inhibitors Cough    Antimicrobials this admission: Azith 7/7 x1 Rocephin 7/7 x1 Cefazolin 7/8 >> Rifampin 7/8 >>   Dose adjustments this admission: none  Microbiology results: 7/7 BCx x2: MSSA 2/2 7/8 BCx x2: neg/F 7/10 left wrist synovial fluid cx: neg/F 7/12 wound left wrist: ngtd  Hildred Laser, Pharm D 01/28/2016 11:12 AM

## 2016-01-29 DIAGNOSIS — R319 Hematuria, unspecified: Secondary | ICD-10-CM

## 2016-01-29 DIAGNOSIS — I959 Hypotension, unspecified: Secondary | ICD-10-CM

## 2016-01-29 DIAGNOSIS — N189 Chronic kidney disease, unspecified: Secondary | ICD-10-CM

## 2016-01-29 DIAGNOSIS — Z515 Encounter for palliative care: Secondary | ICD-10-CM

## 2016-01-29 DIAGNOSIS — Z7189 Other specified counseling: Secondary | ICD-10-CM

## 2016-01-29 DIAGNOSIS — E1159 Type 2 diabetes mellitus with other circulatory complications: Secondary | ICD-10-CM

## 2016-01-29 DIAGNOSIS — I9589 Other hypotension: Secondary | ICD-10-CM

## 2016-01-29 LAB — GLUCOSE, CAPILLARY
Glucose-Capillary: 125 mg/dL — ABNORMAL HIGH (ref 65–99)
Glucose-Capillary: 197 mg/dL — ABNORMAL HIGH (ref 65–99)
Glucose-Capillary: 204 mg/dL — ABNORMAL HIGH (ref 65–99)
Glucose-Capillary: 238 mg/dL — ABNORMAL HIGH (ref 65–99)

## 2016-01-29 LAB — RENAL FUNCTION PANEL
ANION GAP: 8 (ref 5–15)
Albumin: 2.1 g/dL — ABNORMAL LOW (ref 3.5–5.0)
BUN: 117 mg/dL — AB (ref 6–20)
CALCIUM: 8.3 mg/dL — AB (ref 8.9–10.3)
CO2: 16 mmol/L — ABNORMAL LOW (ref 22–32)
CREATININE: 5.45 mg/dL — AB (ref 0.61–1.24)
Chloride: 112 mmol/L — ABNORMAL HIGH (ref 101–111)
GFR, EST AFRICAN AMERICAN: 10 mL/min — AB (ref >60)
GFR, EST NON AFRICAN AMERICAN: 9 mL/min — AB (ref >60)
GLUCOSE: 117 mg/dL — AB (ref 65–99)
PHOSPHORUS: 5.2 mg/dL — AB (ref 2.5–4.6)
POTASSIUM: 4.2 mmol/L (ref 3.5–5.1)
Sodium: 136 mmol/L (ref 135–145)

## 2016-01-29 LAB — BASIC METABOLIC PANEL
Anion gap: 7 (ref 5–15)
BUN: 117 mg/dL — AB (ref 6–20)
CO2: 16 mmol/L — ABNORMAL LOW (ref 22–32)
Calcium: 8.3 mg/dL — ABNORMAL LOW (ref 8.9–10.3)
Chloride: 112 mmol/L — ABNORMAL HIGH (ref 101–111)
Creatinine, Ser: 5.46 mg/dL — ABNORMAL HIGH (ref 0.61–1.24)
GFR, EST AFRICAN AMERICAN: 10 mL/min — AB (ref >60)
GFR, EST NON AFRICAN AMERICAN: 9 mL/min — AB (ref >60)
GLUCOSE: 116 mg/dL — AB (ref 65–99)
Potassium: 4.2 mmol/L (ref 3.5–5.1)
Sodium: 135 mmol/L (ref 135–145)

## 2016-01-29 LAB — CBC
HEMATOCRIT: 24.6 % — AB (ref 39.0–52.0)
Hemoglobin: 8 g/dL — ABNORMAL LOW (ref 13.0–17.0)
MCH: 30.8 pg (ref 26.0–34.0)
MCHC: 32.5 g/dL (ref 30.0–36.0)
MCV: 94.6 fL (ref 78.0–100.0)
Platelets: 264 10*3/uL (ref 150–400)
RBC: 2.6 MIL/uL — ABNORMAL LOW (ref 4.22–5.81)
RDW: 18.7 % — AB (ref 11.5–15.5)
WBC: 10.1 10*3/uL (ref 4.0–10.5)

## 2016-01-29 LAB — PROCALCITONIN: PROCALCITONIN: 0.8 ng/mL

## 2016-01-29 MED ORDER — PREDNISONE 10 MG PO TABS: 40.0000 mg | ORAL_TABLET | Freq: Every day | ORAL | Status: DC

## 2016-01-29 MED ORDER — PREDNISONE 10 MG PO TABS
30.0000 mg | ORAL_TABLET | Freq: Every day | ORAL | Status: DC
  Administered 2016-01-30: 30 mg via ORAL
  Filled 2016-01-29: qty 3

## 2016-01-29 NOTE — Progress Notes (Signed)
SUBJECTIVE: The patient is doing well today, somewhat better spirits.   At this time, he denies chest pain, shortness of breath.    . sodium chloride   Intravenous Once  . amiodarone  100 mg Oral Daily  . atorvastatin  80 mg Oral QHS  . Calcifediol ER  30 mcg Oral QHS  .  ceFAZolin (ANCEF) IV  1 g Intravenous Q24H  . Chlorhexidine Gluconate Cloth  6 each Topical Daily  . insulin aspart  0-5 Units Subcutaneous QHS  . insulin aspart  0-9 Units Subcutaneous TID WC  . insulin aspart  6 Units Subcutaneous TID WC  . multivitamin with minerals  1 tablet Oral q morning - 10a  . mupirocin ointment  1 application Nasal BID  . polyethylene glycol  17 g Oral Daily  . predniSONE  50 mg Oral Q breakfast  . rifampin  600 mg Oral Daily  . tamsulosin  0.4 mg Oral QPC supper   . sodium chloride 50 mL/hr at 01/28/16 2315  . norepinephrine (LEVOPHED) Adult infusion Stopped (01/28/16 1815)  . sodium chloride irrigation      OBJECTIVE: Physical Exam: Filed Vitals:   01/29/16 0600 01/29/16 0700 01/29/16 0800 01/29/16 0805  BP: 147/63 139/62 143/65   Pulse: 85 89    Temp:    99.5 F (37.5 C)  TempSrc:    Oral  Resp: 8 9    Height:      Weight:      SpO2: 98% 98%      Intake/Output Summary (Last 24 hours) at 01/29/16 1017 Last data filed at 01/29/16 0800  Gross per 24 hour  Intake 7984.85 ml  Output   6595 ml  Net 1389.85 ml    Telemetry reveals SR, 60's-80's, one 4 beat NSVT, no bradycardia   GEN- The patient is ill appearing, alert and oriented x 3 today.   Head- normocephalic, atraumatic Eyes-  Sclera clear, conjunctiva pink Ears- hearing intact Oropharynx- clear Neck- supple, no JVP Lungs- Clear to ausculation bilaterally, normal work of breathing Heart- Regular rate and rhythm, no significant murmurs, no rubs or gallops GI- soft, NT, ND Extremities- no clubbing, cyanosis, or edema Skin- no rash or lesion Psych- euthymic mood, full affect Neuro- no gross deficits  appreciated  All surgical/procedureal wounds/dressings are all clean, dry, stable.    LABS: Basic Metabolic Panel:  Recent Labs  01/28/16 0536 01/29/16 0440  NA 135 135  136  K 4.1 4.2  4.2  CL 109 112*  112*  CO2 16* 16*  16*  GLUCOSE 126* 116*  117*  BUN 115* 117*  117*  CREATININE 5.53* 5.46*  5.45*  CALCIUM 8.2* 8.3*  8.3*  PHOS 5.3* 5.2*   Liver Function Tests:  Recent Labs  01/28/16 0536 01/29/16 0440  ALBUMIN 2.3* 2.1*   CBC:  Recent Labs  01/28/16 1245 01/29/16 0440  WBC 11.3* 10.1  HGB 7.3* 8.0*  HCT 21.9* 24.6*  MCV 94.0 94.6  PLT 215 264   Cardiac Enzymes: No results for input(s): CKTOTAL, CKMB, CKMBINDEX, TROPONINI in the last 72 hours.   ASSESSMENT AND PLAN:  1. Bacteremia with evidence of vegetation on his atrial pacing lead on TTE     POD #2      s/p PPM system extraction yesterday with Dr. Lovena Le     Required vascular surgery to retrieve RV lead tip from RFV (vascular following periodically while here)     Procedure sites/wounds stable, wound check appt/follow up  for pacer extraction site is arranged          2. SSSx, PAFib w/PPM  He is in [place] is at least 4, not on a/c with hx of GIB  no plans for new PPM implant at this time   2. Multifocal pneumonia   (presenting with hemotysis)  3. Hematuria (bladder ?blood clot)   Urology on the case     CBI  4. CKD, ARF     BUN/Creat today 117/5.46 (similar to yesterday)     Patient has declined start of HD     Robbert Langlinais continue to defer the conversation to nephrology service  5. Hypotension      Started on nor-epi yesterday, felt secondary to BP/pain meds     Weaned off overnight, 143/65 this morning  6. Anemia     Appears stable    Continue management with primary team, EP service remains available, please recall if neded.   Tommye Standard, PA-C 01/29/2016 10:17 AM    I have seen and examined this patient with Tommye Standard.  Agree with above,  note added to reflect my findings.  On exam, regular rhythm, no murmurs, lungs clear.  Had pacemaker extraction without issues.  Plan for continued antibiotics per primary team.      Cambrey Lupi M. Saksham Akkerman MD 01/29/2016 12:48 PM

## 2016-01-29 NOTE — Progress Notes (Signed)
Patient ID: [name] Brow., male   DOB: [birth year], 80 y.o.   MRN: [id number] S:No new complaints today O:BP 139/62 mmHg  Pulse 89  Temp(Src) 99.5 F (37.5 C) (Oral)  Resp 9  Ht _0  (1.778 m)  Wt 76.658 kg (169 lb)  BMI 24.25 kg/m2  SpO2 98%  Intake/Output Summary (Last 24 hours) at 01/29/16 0834 Last data filed at 01/29/16 0700  Gross per 24 hour  Intake 8484.85 ml  Output   6745 ml  Net 1739.85 ml   Intake/Output: I/O last 3 completed shifts: In: 10719.9 [P.O.:700; I.V.:3034.9; Blood:335; Other:6000; IV Piggyback:650] Out: 25053 [[id number]]  Intake/Output this shift:    Weight change:  TKW:IOXBD, elderly AAM in NAD CVS:no rub Resp:cta ZHG:DJMEQA Ext:+1 edema   Recent Labs Lab 01/23/16 0239 01/24/16 0317 01/25/16 0311 01/26/16 0513 01/27/16 0910 01/27/16 1854 01/27/16 2019 01/28/16 0536 01/29/16 0440  NA 128* 131* 130* 131* 133* 134* 136 135 135  136  K 3.7 4.2 4.3 4.5 4.2 4.4 4.5 4.1 4.2  4.2  CL 98* 102 101 102 106  --   --  109 112*  112*  CO2 20* 20* 20* 20* 18*  --   --  16* 16*  16*  GLUCOSE 127* 192* 154* 197* 179*  --   --  126* 116*  117*  BUN 83* 100* 114* 123* 126*  --   --  115* 117*  117*  CREATININE 3.69* 4.20* 5.12* 5.71* 5.92*  --   --  5.53* 5.46*  5.45*  ALBUMIN  --   --   --  2.0* 2.1*  --   --  2.3* 2.1*  CALCIUM 8.5* 8.7* 8.6* 8.2* 8.7*  --   --  8.2* 8.3*  8.3*  PHOS  --   --   --  4.1 4.7*  --   --  5.3* 5.2*   Liver Function Tests:  Recent Labs Lab 01/27/16 0910 01/28/16 0536 01/29/16 0440  ALBUMIN 2.1* 2.3* 2.1*   No results for input(s): LIPASE, AMYLASE in the last 168 hours. No results for input(s): AMMONIA in the last 168 hours. CBC:  Recent Labs Lab 01/26/16 0513 01/27/16 0910  01/28/16 0008 01/28/16 1245 01/29/16 0440  WBC 8.6 14.1*  --  13.3* 11.3* 10.1  HGB 7.0* 9.1*  < > 8.8* 7.3* 8.0*  HCT 20.5* 27.0*  < > 26.1* 21.9* 24.6*  MCV 93.2 92.5  --  91.9 94.0 94.6  PLT 251 307  --  264 215 264   < > = values in this interval not displayed. Cardiac Enzymes:  Recent Labs Lab 01/25/16 1519  CKTOTAL 60   CBG:  Recent Labs Lab 01/28/16 0405 01/28/16 0758 01/28/16 1157 01/28/16 1602 01/28/16 2135  GLUCAP 122* 123* 111* 160* 176*    Iron Studies: No results for input(s): IRON, TIBC, TRANSFERRIN, FERRITIN in the last 72 hours. Studies/Results: Dg Chest Port 1 View  01/28/2016  CLINICAL DATA:  Weakness, infected pacemaker EXAM: PORTABLE CHEST 1 VIEW COMPARISON:  Portable chest x-ray of January 27, 2016 FINDINGS: The left lung is well-expanded and clear. On the right there is mild volume loss with increased density in the upper and lower lobes which is stable. There is no pneumothorax. There is biapical pleural thickening the heart is normal in size. There is dense calcification in the aortic arch. The sternal wires are intact. The right internal jugular venous catheter tip projects over the distal third of the SVC just above the cavoatrial  junction. IMPRESSION: 1. Persistent infiltrate in the right upper and lower lobes. Small right pleural effusion. No definite pneumothorax. 2. The left lung is clear.  No CHF.  Aortic atherosclerosis. Electronically Signed   By: David  Martinique M.D.   On: 01/28/2016 07:11   Dg Chest Portable 1 View  01/27/2016  CLINICAL DATA:  Right IJ placement EXAM: PORTABLE CHEST 1 VIEW COMPARISON:  January 16, 2016 FINDINGS: A right IJ has been placed in the interval, probably terminating just within the right side of the atrium, approximately 2 cm inferior to the caval atrial junction. No pneumothorax. Opacity remains in the right mid lung, less focal in the interval. Opacity and effusion remains in the left lung base, also less focal in the interval. No other interval changes. IMPRESSION: 1. The distal tip of the new right IJ probably terminates approximately 2 cm below the caval atrial junction, just within the right side of the atrium. Recommend repositioning as  clinically warranted. No pneumothorax. 2. The right-sided pulmonary infiltrates are less focal in the interval but remain. There is probably a small amount of layering effusion on the right as well. Electronically Signed   By: Dorise Bullion III M.D   On: 01/27/2016 19:55   . sodium chloride   Intravenous Once  . amiodarone  100 mg Oral Daily  . atorvastatin  80 mg Oral QHS  . Calcifediol ER  30 mcg Oral QHS  .  ceFAZolin (ANCEF) IV  1 g Intravenous Q24H  . Chlorhexidine Gluconate Cloth  6 each Topical Daily  . insulin aspart  0-5 Units Subcutaneous QHS  . insulin aspart  0-9 Units Subcutaneous TID WC  . insulin aspart  6 Units Subcutaneous TID WC  . multivitamin with minerals  1 tablet Oral q morning - 10a  . mupirocin ointment  1 application Nasal BID  . polyethylene glycol  17 g Oral Daily  . predniSONE  50 mg Oral Q breakfast  . rifampin  600 mg Oral Daily  . tamsulosin  0.4 mg Oral QPC supper    BMET    Component Value Date/Time   NA 136 01/29/2016 0440   NA 135 01/29/2016 0440   NA 146* 08/10/2013 1324   K 4.2 01/29/2016 0440   K 4.2 01/29/2016 0440   K 3.9 08/10/2013 1324   CL 112* 01/29/2016 0440   CL 112* 01/29/2016 0440   CO2 16* 01/29/2016 0440   CO2 16* 01/29/2016 0440   CO2 27 08/10/2013 1324   GLUCOSE 117* 01/29/2016 0440   GLUCOSE 116* 01/29/2016 0440   GLUCOSE 105 08/10/2013 1324   BUN 117* 01/29/2016 0440   BUN 117* 01/29/2016 0440   BUN 40.3* 08/10/2013 1324   CREATININE 5.45* 01/29/2016 0440   CREATININE 5.46* 01/29/2016 0440   CREATININE 5.79* 11/07/2015 1440   CREATININE 2.6* 08/10/2013 1324   CALCIUM 8.3* 01/29/2016 0440   CALCIUM 8.3* 01/29/2016 0440   CALCIUM 9.4 08/10/2013 1324   GFRNONAA 9* 01/29/2016 0440   GFRNONAA 9* 01/29/2016 0440   GFRAA 10* 01/29/2016 0440   GFRAA 10* 01/29/2016 0440   CBC    Component Value Date/Time   WBC 10.1 01/29/2016 0440   WBC 3.3* 01/12/2016 1007   RBC 2.60* 01/29/2016 0440   RBC 2.80* 01/12/2016 1007    RBC 2.64* 03/17/2013 0534   HGB 8.0* 01/29/2016 0440   HGB 9.3* 01/12/2016 1007   HCT 24.6* 01/29/2016 0440   HCT 28.3* 01/12/2016 1007   PLT 264 01/29/2016 0440  PLT 88* 01/12/2016 1007   MCV 94.6 01/29/2016 0440   MCV 101.1* 01/12/2016 1007   MCH 30.8 01/29/2016 0440   MCH 33.2 01/12/2016 1007   MCHC 32.5 01/29/2016 0440   MCHC 32.9 01/12/2016 1007   RDW 18.7* 01/29/2016 0440   RDW 16.9* 01/12/2016 1007   LYMPHSABS 0.8 01/16/2016 1239   LYMPHSABS 0.9 01/12/2016 1007   MONOABS 1.4* 01/16/2016 1239   MONOABS 0.6 01/12/2016 1007   EOSABS 0.0 01/16/2016 1239   EOSABS 0.3 01/12/2016 1007   BASOSABS 0.0 01/16/2016 1239   BASOSABS 0.0 01/12/2016 1007     Assessment/Plan: 1. AKI/CKD- in very ill, elderly male with ongoing MSSA bacteremia and likely pacemaker wire involvement and possible endocarditis. DDx includes ischemic ATN in setting of severe illness, interstitial nephritis from abx, post-infectious GN, or progressive CKD given his advanced CKD at baseline. His bladder mass is also an issue, although no evidence of obstruction by [place]. Discussed this with mr. Carreon and his family. He admits that he did not have positive feelings about hemodialysis before this hospitalization and is not currently interested at this time.  1. He is markedly debilitated and agree with his decision not to pursue renal replacement therapy as he will not do well with hemodialysis.  2. workup for AKI/CKD thus far most consistent with pre-renal causes (low FeNa, normal complements, negative ASO) and continue with conservative therapy for now.  3. BUN/Scr improved slightly to stable. 4. Consult palliative care to help set goals/limits of care (pt was uncooperative with Palliative care yesterday) but scheduled today with wife. 5. He remains consistent about not proceeding with dialysis which would not improve his overall prognosis or quality of life. 2. MSSA bacteremia with involvement of  pacemaker wire in RA.  1. Changed dose of ancef for advanced CKD (his eGFR <76m/min) to 1 gram daily (which may have contributed to his AKI/CKD) 2. TEE without vegetations  3. S/p pacemaker lead removal complicated by lead breakage and required vascular surgery consult to remove from right femoral vein. 3. Bladder mass- s/p cysto and consistent with multiple large blood clots 1. Not able to be removed and per Dr. HLouis Meckel 2. Continuous bladder irrigations started 01/27/16 still with gross hematuria 4. Prostate cancer s/p xrt and lupron 5. Myelodysplastic syndrome/Anemia of chronic disease and acute illness. Was on procrit prior to admission. 1. Transfusions prn (if needed would give each unit PRBC's over 3 hours with lasix 467mIV between units to prevent flash pulm edema) 6. Hyponatremia - due to #1 as well as poor po intake. Improving with IVF's. 7. CAD- cardiology following 8. 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 10. Metabolic acidosis- due to #1 11. Disposition- poor overall prognosis in an elderly, debilitated man with multiple chronic issues. He is not interested in HD and palliative care consult to help set goals/limits of care would be helpful.  JoBasin

## 2016-01-29 NOTE — Consult Note (Signed)
Consultation Note Date: 01/29/2016   Patient Name: [name]  DOB: [birth year]  MRN: [id number]  Age / Sex: 80 y.o., male  PCP: [name] Fess, MD Referring Physician: Albertine Patricia, MD  Reason for Consultation: Establishing goals of care  HPI/Patient Profile: 80 y.o. male  with past medical history of PMH of paroxysmal atrial fibrillation/pacemaker not on anticoagulation secondary to history of GI bleed, CAD, stage IV CKD, CAD, hypertension,Liddle's syndrome, myelodysplastic syndrome, PVD and hyperlipidemia admitted on 01/16/2016 with MMSA bacteremia (status post wrist arthropathy as well as pacemaker removal), hematuria, myelodysplastic syndrome/anemia of chronic disease, and acute kidney injury on chronic kidney disease.   Clinical Assessment and Goals of Care: I met today with [name], his wife, his sister, other siblings via phone, multiple children, grandchildren, nieces, and nephews.  He reports the most important thing to him are his family and his faith. He is retired [employer] manager and he has maintained his strength throughout his illness through his faith.  He reports that his doctors have been doing a good job explaining things to him. His family is very appreciative of the time spent to ensure that they have good understanding of his illness. We reviewed his clinical course to this point in time and discussed his multiple comorbid conditions in addition to long-term change in his nutrition, functional status, and cognition that were going on even prior to this admission.  He and his family report that they want him to live as well as possible for as long as possible, but also when the time comes that he dies that they do not want to prolong this as he knows that there is something better waiting for him.  We discussed that in light of multiple chronic medical problems that have worsened with this  acute problem, care should be focused on interventions that are likely to allow him to achieve goal of getting out of the hospital and spending time with family. I discussed with him and his family regarding heroic interventions at the end-of-life and they agree this would not be in line with prior expressed wishes for a natural death or be likely to lead to getting well enough to go back home. He was in agreement with changing CODE STATUS to DO NOT RESUSCITATE.  Otherwise, family would like to continue with current therapy. We discussed meeting again in 24-48 hours to reevaluate his clinical course and continue discussion on goals of care.  We have set a meeting for Saturday at 3 PM.  SUMMARY OF RECOMMENDATIONS   - Patient reports again today that he is not interested in dialysis. - He would like to continue with current therapy, however, he does not want aggressive care in the event of cardiac or respiratory arrest. CODE STATUS updated to DO NOT RESUSCITATE. - He would like to continue with current therapy for another 48 hours to see how he responds. - Plan for another family meeting on Saturday at 3 PM (in 48 hours) to further discuss clinical course and continue  conversation regarding goals of care.  Code Status/Advance Care Planning:  DNR  Palliative Prophylaxis:   Aspiration and Frequent Pain Assessment  Additional Recommendations (Limitations, Scope, Preferences):  Continue current therapy. No dialysis. Plan for another family meeting and 48 hours to discuss progress.  Psycho-social/Spiritual:   Desire for further Chaplaincy support:no  Additional Recommendations: Caregiving  Support/Resources  Prognosis:   Unable to determine, But he remains critically ill with multi system involvement and is at high risk for continued decompensation and/or death  Discharge Planning: To Be Determined      Primary Diagnoses: Present on Admission:  . CAP (community acquired pneumonia) .  (Resolved) Pneumonia . Staphylococcus aureus bacteremia . Pacemaker . CKD (chronic kidney disease), stage IV ([place]) . PAF (paroxysmal atrial fibrillation) ([place]) . Hemoptysis . Pain . Wrist swelling . Anemia of chronic disease . CAD of autologous bypass graft . Chronic diastolic heart failure ([place]) . Essential hypertension . Pseudogout involving multiple joints . Hematuria . MDS (myelodysplastic syndrome) ([place]) . Bladder mass . Acute renal failure superimposed on stage 4 chronic kidney disease ([place])  I have reviewed the medical record, interviewed the patient and family, and examined the patient. The following aspects are pertinent.  Past Medical History  Diagnosis Date  . Hypertension   . Hyperlipidemia   . GERD (gastroesophageal reflux disease)   . DVT (deep venous thrombosis) ([place]) 2011    Right arm  . Atrial fibrillation ([place])   . Gout   . Depressive disorder   . Internal hemorrhoids without mention of complication   . Gastroparesis   . Osteopenia   . Ischemic colitis ([place])   . Peripheral artery disease ([employer])   . Chronic diastolic heart failure ([place])   . Liddle's syndrome ([place])   . Renal artery stenosis ([place])   . CKD (chronic kidney disease)   . Vitamin B 12 deficiency   . CAD (coronary artery disease)     s/b CABG 1994, and subsequent stents. Repeat CABG 12/2011,  . Prostate cancer ([place]) 1997    XRT and lupron  . Myelodysplastic syndrome ([place]) 05/22/2013    With low hemoglobin and platelets treated with Procrit  . Angiomyolipoma 2009    On both kidneys noted in 2009  . Sick sinus syndrome ([place])   . Peptic ulcer     S/p partial gastrectomy in 1969  . Arthritis     neck and left wrist  . History of hiatal hernia   . Diabetes mellitus type 2 with peripheral artery disease ([facility])     DIET CONTROLLED  . Pneumonia 01/16/2016  . Presence of permanent cardiac pacemaker    Social History   Social History  . Marital Status: Married    Spouse Name: N/A  . Number  of Children: N/A  . Years of Education: N/A   Social History Main Topics  . Smoking status: Former Smoker -- 20 years    Types: Cigarettes    Quit date: 07/12/1969  . Smokeless tobacco: Never Used  . Alcohol Use: No  . Drug Use: No  . Sexual Activity: Yes   Other Topics Concern  . None   Social History Narrative   Family History  Problem Relation Age of Onset  . Diabetes Mother   . Heart disease Mother     Heart Disease before age 69  . Hyperlipidemia Mother   . Hypertension Mother   . CVA Mother 47    cause of death  . Cancer Father  stomach/liver  . Hypertension Father     possibly hypertensive  . Cancer Sister     Breast cancer  . Heart disease Daughter     Heart Disease before age 19  . Hypertension Daughter   . Heart attack Daughter   . CAD Brother   . Heart disease Brother   . Cancer Paternal Uncle     colon  . Heart disease Sister   . Diabetes Sister   . Hypertension Sister    Scheduled Meds: . sodium chloride   Intravenous Once  . amiodarone  100 mg Oral Daily  . atorvastatin  80 mg Oral QHS  . Calcifediol ER  30 mcg Oral QHS  .  ceFAZolin (ANCEF) IV  1 g Intravenous Q24H  . Chlorhexidine Gluconate Cloth  6 each Topical Daily  . insulin aspart  0-5 Units Subcutaneous QHS  . insulin aspart  0-9 Units Subcutaneous TID WC  . insulin aspart  6 Units Subcutaneous TID WC  . multivitamin with minerals  1 tablet Oral q morning - 10a  . mupirocin ointment  1 application Nasal BID  . polyethylene glycol  17 g Oral Daily  . [START ON 01/30/2016] predniSONE  30 mg Oral Q breakfast  . rifampin  600 mg Oral Daily  . tamsulosin  0.4 mg Oral QPC supper   Continuous Infusions: . sodium chloride 50 mL/hr at 01/28/16 2315  . norepinephrine (LEVOPHED) Adult infusion Stopped (01/28/16 1815)  . sodium chloride irrigation     PRN Meds:.acetaminophen, acetaminophen, acetaminophen, ondansetron (ZOFRAN) IV, ondansetron (ZOFRAN) IV, oxyCODONE-acetaminophen, sodium  chloride flush Medications Prior to Admission:  Prior to Admission medications   Medication Sig Start Date End Date Taking? Authorizing Provider  acetaminophen (ARTHRITIS PAIN RELIEF) 650 MG CR tablet Take 1,300 mg by mouth 3 (three) times daily.    Yes Historical Provider, MD  amiodarone (PACERONE) 200 MG tablet Take one-half tablet by  mouth daily 01/07/16  Yes Belva Crome, MD  aspirin 81 MG tablet Take 1 tablet (81 mg total) by mouth daily. 09/10/14  Yes Belva Crome, MD  atorvastatin (LIPITOR) 80 MG tablet Take 80 mg by mouth at bedtime.    Yes Historical Provider, MD  Calcifediol ER (RAYALDEE) 30 MCG CPCR Take 30 mcg by mouth at bedtime.   Yes Historical Provider, MD  CALCIUM-VITAMIN D PO Take 1 capsule by mouth 2 (two) times daily.   Yes Historical Provider, MD  carvedilol (COREG) 25 MG tablet TAKE 1 TABLET BY MOUTH  TWICE DAILY WITH MEALS 01/07/16  Yes Belva Crome, MD  cloNIDine (CATAPRES) 0.1 MG tablet Take 0.1 mg by mouth 2 (two) times daily. 02/19/15  Yes Historical Provider, MD  clopidogrel (PLAVIX) 75 MG tablet Take 1 tablet by mouth  daily 01/07/16  Yes Belva Crome, MD  cyanocobalamin (,VITAMIN B-12,) 1000 MCG/ML injection Inject 1,000 mcg into the muscle every 30 (thirty) days. Vitamin B12 - last injection 09/01/15   Yes Historical Provider, MD  epoetin alfa (EPOGEN,PROCRIT) 20254 UNIT/ML injection Inject 10,000 Units into the skin every 14 (fourteen) days. Done at [facility] - last injection 09/22/15   Yes Historical Provider, MD  furosemide (LASIX) 80 MG tablet Take 80 mg by mouth 2 (two) times daily. Pt takes 1 and 1/2 tablet daily. (140 mg) total   Yes Historical Provider, MD  hydrALAZINE (APRESOLINE) 50 MG tablet Take 1 tablet (50 mg total) by mouth 3 (three) times daily. 11/23/13  Yes Belva Crome, MD  isosorbide mononitrate (IMDUR) 120 MG 24 hr tablet Take 1 tablet by mouth  daily 01/07/16  Yes Belva Crome, MD  Multiple Vitamin (MULTIVITAMIN WITH MINERALS) TABS tablet  Take 1 tablet by mouth every morning.   Yes Historical Provider, MD  pantoprazole (PROTONIX) 40 MG tablet Take 1 tablet by mouth daily. 01/14/16  Yes Historical Provider, MD  potassium chloride SA (K-DUR,KLOR-CON) 20 MEQ tablet Take 20-40 mEq by mouth 3 (three) times daily. Take 2 tablets (40 meq) by mouth with breakfast, take 1 tablet (20 meq) with lunch and supper 06/21/13  Yes Belva Crome, MD  [place]   Yes Historical Provider, MD  silodosin (RAPAFLO) 8 MG CAPS capsule Take 8 mg by mouth at bedtime.    Yes Historical Provider, MD  spironolactone (ALDACTONE) 25 MG tablet Take 25 mg by mouth daily.   Yes Historical Provider, MD  leuprolide (LUPRON) 30 MG injection Inject 30 mg into the muscle every 4 (four) months.    Historical Provider, MD   Allergies  Allergen Reactions  . Nsaids Other (See Comments)    GI issue  . Ibuprofen Other (See Comments)    GI Issues  . Ace Inhibitors Cough   Review of Systems  Constitutional: Positive for activity change, appetite change, fatigue and unexpected weight change.  HENT: Positive for drooling.   Respiratory: Positive for shortness of breath and wheezing.   Gastrointestinal: Positive for nausea and blood in stool.  Endocrine: Positive for cold intolerance.  Musculoskeletal: Positive for back pain.  Neurological: Positive for speech difficulty and weakness.  Psychiatric/Behavioral: Positive for confusion and sleep disturbance.    Physical Exam  General exam: Weak,Laying in bed Respiratory system: Clear to auscultation. Respiratory effort normal. Cardiovascular system: Tachy. No JVD, rubs, gallops or clicks. Grade 2/6 SEM. Gastrointestinal system: Abdomen is nondistended, soft and nontender. No organomegaly or masses felt. Normal bowel sounds heard. Central nervous system:  No focal neurological deficits. Extremities: Left hand markedly erythematous/swollen and tender with limited range of motion of  fingers. Skin: No rashes, lesions or ulcers Psychiatry: Judgement and insight appear normal. Mood & affect appropriate  Vital Signs: BP 148/69 mmHg  Pulse 85  Temp(Src) 98.4 F (36.9 C) (Oral)  Resp 20  Ht _0  (1.778 m)  Wt 76.658 kg (169 lb)  BMI 24.25 kg/m2  SpO2 100% Pain Assessment: No/denies pain POSS *See Group Information*: S-Acceptable,Sleep, easy to arouse Pain Score: 3    SpO2: SpO2: 100 % O2 Device:SpO2: 100 % O2 Flow Rate: .O2 Flow Rate (L/min): 1 L/min  IO: Intake/output summary:  Intake/Output Summary (Last 24 hours) at 01/29/16 1814 Last data filed at 01/29/16 1700  Gross per 24 hour  Intake 4971.4 ml  Output   6220 ml  Net -1248.6 ml    LBM: Last BM Date: 01/25/16 Baseline Weight: Weight: 70.8 kg (156 lb 1.4 oz) Most recent weight: Weight: 76.658 kg (169 lb)     Palliative Assessment/Data: 30%   Flowsheet Rows        Most Recent Value   Intake Tab    Referral Department  Hospitalist   Unit at Time of Referral  Cardiac/Telemetry Unit   Palliative Care Primary Diagnosis  Cardiac   Date Notified  01/27/16   Palliative Care Type  Return patient Palliative Care   Reason for referral  Clarify Goals of Care   Date of Admission  01/16/16   Date first seen by Palliative Care  01/28/16   #  of days Palliative referral response time  1 Day(s)   # of days IP prior to Palliative referral  11   Clinical Assessment    Palliative Performance Scale Score  40%   Pain Max last 24 hours  4   Pain Min Last 24 hours  0   Psychosocial & Spiritual Assessment    Palliative Care Outcomes    Patient/Family meeting held?  Yes   Who was at the meeting?  Patient, his wife, multiple family members (>10) including children, grandchildren, nieces, nephews, and several other members of family via phone   Palliative Care Outcomes  Clarified goals of care, Changed CPR status      Time In: 1630 Time Out: 80 Time Total: 6599 Greater than 50%  of this time was spent  counseling and coordinating care related to the above assessment and plan.  Signed by: Micheline Rough, MD   Please contact Palliative Medicine Team phone at [phone] for questions and concerns.  For individual provider: See Shea Evans

## 2016-01-29 NOTE — Care Management Important Message (Signed)
Important Message  Patient Details  Name: [name] CURTISS Sr. MRN: [id number] Date of Birth: [birth year]   Medicare Important Message Given:  Yes    [name] 01/29/2016, 10:55 AM

## 2016-01-29 NOTE — Progress Notes (Signed)
2 Days Post-Op Subjective: Patient denies suprapubic pain. Urine clear on slow drip CBI  Objective: Vital signs in last 24 hours: Temp:  [98.4 F (36.9 C)-99.5 F (37.5 C)] 98.7 F (37.1 C) (07/20 2010) Pulse Rate:  [73-103] 78 (07/20 2100) Resp:  [8-21] 17 (07/20 2100) BP: (115-168)/(57-81) 159/75 mmHg (07/20 2100) SpO2:  [97 %-100 %] 100 % (07/20 2100)  Intake/Output from previous day: 07/19 0701 - 07/20 0700 In: 8534.9 [P.O.:700; I.V.:1234.9; IV Piggyback:600] Out: 6745 [Urine:6745] Intake/Output this shift: Total I/O In: 150 [I.V.:150] Out: 215 [Urine:215]  Physical Exam:  General:alert, cooperative and appears stated age GI: soft, non tender, normal bowel sounds, no palpable masses, no organomegaly, no inguinal hernia Male genitalia: not done Extremities: extremities normal, atraumatic, no cyanosis or edema  Lab Results:  Recent Labs  01/28/16 0008 01/28/16 1245 01/29/16 0440  HGB 8.8* 7.3* 8.0*  HCT 26.1* 21.9* 24.6*   BMET  Recent Labs  01/28/16 0536 01/29/16 0440  NA 135 135  136  K 4.1 4.2  4.2  CL 109 112*  112*  CO2 16* 16*  16*  GLUCOSE 126* 116*  117*  BUN 115* 117*  117*  CREATININE 5.53* 5.46*  5.45*  CALCIUM 8.2* 8.3*  8.3*   No results for input(s): LABPT, INR in the last 72 hours. No results for input(s): LABURIN in the last 72 hours. Results for orders placed or performed during the hospital encounter of 01/16/16  Blood culture (routine x 2)     Status: Abnormal   Collection Time: 01/16/16  1:00 PM  Result Value Ref Range Status   Specimen Description LEFT ANTECUBITAL  Final   Special Requests BOTTLES DRAWN AEROBIC AND ANAEROBIC 10CC  Final   Culture  Setup Time   Final    GRAM POSITIVE COCCI IN CLUSTERS IN BOTH AEROBIC AND ANAEROBIC BOTTLES CRITICAL RESULT CALLED TO, READ BACK BY AND VERIFIED WITH: J MARKLE 01/17/16 @ 0927 M VESTAL    Culture (A)  Final    STAPHYLOCOCCUS AUREUS SUSCEPTIBILITIES PERFORMED ON PREVIOUS  CULTURE WITHIN THE LAST 5 DAYS.    Report Status 01/19/2016 FINAL  Final  Blood culture (routine x 2)     Status: Abnormal   Collection Time: 01/16/16  1:06 PM  Result Value Ref Range Status   Specimen Description BLOOD LEFT HAND  Final   Special Requests AEROBIC BOTTLE ONLY 10CC  Final   Culture  Setup Time   Final    GRAM POSITIVE COCCI IN CLUSTERS AEROBIC BOTTLE ONLY CRITICAL RESULT CALLED TO, READ BACK BY AND VERIFIED WITH: J MARKLE 01/17/16 @ 0927 M VESTAL    Culture STAPHYLOCOCCUS AUREUS (A)  Final   Report Status 01/19/2016 FINAL  Final   Organism ID, Bacteria STAPHYLOCOCCUS AUREUS  Final      Susceptibility   Staphylococcus aureus - MIC*    CIPROFLOXACIN >=8 RESISTANT Resistant     ERYTHROMYCIN <=0.25 SENSITIVE Sensitive     GENTAMICIN <=0.5 SENSITIVE Sensitive     OXACILLIN 0.5 SENSITIVE Sensitive     TETRACYCLINE <=1 SENSITIVE Sensitive     VANCOMYCIN <=0.5 SENSITIVE Sensitive     TRIMETH/SULFA <=10 SENSITIVE Sensitive     CLINDAMYCIN <=0.25 SENSITIVE Sensitive     RIFAMPIN <=0.5 SENSITIVE Sensitive     Inducible Clindamycin NEGATIVE Sensitive     * STAPHYLOCOCCUS AUREUS  Blood Culture ID Panel (Reflexed)     Status: Abnormal   Collection Time: 01/16/16  1:06 PM  Result Value Ref Range Status  Enterococcus species NOT DETECTED NOT DETECTED Final   Vancomycin resistance NOT DETECTED NOT DETECTED Final   Listeria monocytogenes NOT DETECTED NOT DETECTED Final   Staphylococcus species DETECTED (A) NOT DETECTED Final    Comment: CRITICAL RESULT CALLED TO, READ BACK BY AND VERIFIED WITH: J MARKLE 01/17/16 @ 0927 M VESTAL    Staphylococcus aureus DETECTED (A) NOT DETECTED Final    Comment: CRITICAL RESULT CALLED TO, READ BACK BY AND VERIFIED WITH: J MARKLE 01/17/16 @ 0927 M VESTAL    Methicillin resistance NOT DETECTED NOT DETECTED Final   Streptococcus species NOT DETECTED NOT DETECTED Final   Streptococcus agalactiae NOT DETECTED NOT DETECTED Final   Streptococcus  pneumoniae NOT DETECTED NOT DETECTED Final   Streptococcus pyogenes NOT DETECTED NOT DETECTED Final   Acinetobacter baumannii NOT DETECTED NOT DETECTED Final   Enterobacteriaceae species NOT DETECTED NOT DETECTED Final   Enterobacter cloacae complex NOT DETECTED NOT DETECTED Final   Escherichia coli NOT DETECTED NOT DETECTED Final   Klebsiella oxytoca NOT DETECTED NOT DETECTED Final   Klebsiella pneumoniae NOT DETECTED NOT DETECTED Final   Proteus species NOT DETECTED NOT DETECTED Final   Serratia marcescens NOT DETECTED NOT DETECTED Final   Carbapenem resistance NOT DETECTED NOT DETECTED Final   Haemophilus influenzae NOT DETECTED NOT DETECTED Final   Neisseria meningitidis NOT DETECTED NOT DETECTED Final   Pseudomonas aeruginosa NOT DETECTED NOT DETECTED Final   Candida albicans NOT DETECTED NOT DETECTED Final   Candida glabrata NOT DETECTED NOT DETECTED Final   Candida krusei NOT DETECTED NOT DETECTED Final   Candida parapsilosis NOT DETECTED NOT DETECTED Final   Candida tropicalis NOT DETECTED NOT DETECTED Final  Culture, sputum-assessment     Status: None   Collection Time: 01/16/16 10:24 PM  Result Value Ref Range Status   Specimen Description EXPECTORATED SPUTUM  Final   Special Requests NONE  Final   Sputum evaluation   Final    THIS SPECIMEN IS ACCEPTABLE. RESPIRATORY CULTURE REPORT TO FOLLOW.   Report Status 01/17/2016 FINAL  Final  Culture, respiratory (NON-Expectorated)     Status: None   Collection Time: 01/16/16 10:24 PM  Result Value Ref Range Status   Specimen Description EXPECTORATED SPUTUM  Final   Special Requests NONE  Final   Gram Stain   Final    RARE SQUAMOUS EPITHELIAL CELLS PRESENT ABUNDANT WBC PRESENT,BOTH PMN AND MONONUCLEAR FEW GRAM POSITIVE COCCI IN PAIRS IN CLUSTERS    Culture Consistent with normal respiratory flora.  Final   Report Status 01/19/2016 FINAL  Final  Culture, blood (routine x 2)     Status: None   Collection Time: 01/17/16  9:50 AM   Result Value Ref Range Status   Specimen Description BLOOD BLOOD LEFT ARM  Final   Special Requests BOTTLES DRAWN AEROBIC ONLY 5CC  Final   Culture NO GROWTH 5 DAYS  Final   Report Status 01/22/2016 FINAL  Final  Culture, blood (routine x 2)     Status: None   Collection Time: 01/17/16  9:50 AM  Result Value Ref Range Status   Specimen Description BLOOD LEFT ANTECUBITAL  Final   Special Requests BOTTLES DRAWN AEROBIC ONLY 5CC  Final   Culture NO GROWTH 5 DAYS  Final   Report Status 01/22/2016 FINAL  Final  Acid Fast Smear (AFB)     Status: None   Collection Time: 01/17/16 11:26 AM  Result Value Ref Range Status   AFB Specimen Processing Concentration  Final   Acid  Fast Smear Negative  Final    Comment: (NOTE) Performed At: [facility] [place], [place] [id number] Lindon Romp MD [id number]    Source (AFB) SPUTUM  Final  Body fluid culture     Status: None   Collection Time: 01/19/16  4:37 PM  Result Value Ref Range Status   Specimen Description FLUID SYNOVIAL WRIST LEFT  Final   Special Requests NONE  Final   Gram Stain   Final    ABUNDANT WBC PRESENT,BOTH PMN AND MONONUCLEAR NO ORGANISMS SEEN    Culture NO GROWTH 3 DAYS  Final   Report Status 01/22/2016 FINAL  Final  Aerobic/Anaerobic Culture (surgical/deep wound)     Status: None   Collection Time: 01/21/16  6:52 PM  Result Value Ref Range Status   Specimen Description WOUND LEFT WRIST  Final   Special Requests NONE  Final   Gram Stain   Final    WBC PRESENT,BOTH PMN AND MONONUCLEAR NO ORGANISMS SEEN    Culture No growth aerobically or anaerobically.  Final   Report Status 01/26/2016 FINAL  Final  Surgical pcr screen     Status: Abnormal   Collection Time: 01/27/16  1:12 PM  Result Value Ref Range Status   MRSA, PCR NEGATIVE NEGATIVE Final   Staphylococcus aureus POSITIVE (A) NEGATIVE Final    Comment:        The Xpert SA Assay (FDA approved for NASAL specimens in patients over 27  years of age), is one component of a comprehensive surveillance program.  Test performance has been validated by [facility] for patients greater than or equal to 38 year old. It is not intended to diagnose infection nor to guide or monitor treatment.     Studies/Results: Dg Chest Port 1 View  01/28/2016  CLINICAL DATA:  Weakness, infected pacemaker EXAM: PORTABLE CHEST 1 VIEW COMPARISON:  Portable chest x-ray of January 27, 2016 FINDINGS: The left lung is well-expanded and clear. On the right there is mild volume loss with increased density in the upper and lower lobes which is stable. There is no pneumothorax. There is biapical pleural thickening the heart is normal in size. There is dense calcification in the aortic arch. The sternal wires are intact. The right internal jugular venous catheter tip projects over the distal third of the SVC just above the cavoatrial junction. IMPRESSION: 1. Persistent infiltrate in the right upper and lower lobes. Small right pleural effusion. No definite pneumothorax. 2. The left lung is clear.  No CHF.  Aortic atherosclerosis. Electronically Signed   By: David  Martinique M.D.   On: 01/28/2016 07:11    Assessment/Plan: 80yo with radiation cystitis and gross hematuria  1. CBI turned off today and urine remains clear 2. Continue foley to straight drain   LOS: 13 days   Nicolette Bang 01/29/2016, 11:06 PM

## 2016-01-29 NOTE — Progress Notes (Signed)
PULMONARY / CRITICAL CARE MEDICINE   Name: [name] RUCKERT Sr. MRN: [id number] DOB: 09/08/1935    ADMISSION DATE:  01/16/2016 CONSULTATION DATE:  7/19  REFERRING MD:  Elgergawy ([facility])   CHIEF COMPLAINT:  Hypotension   HISTORY OF PRESENT ILLNESS:   80yo male with multiple medical problems including CAD s/p CABG, myelodysplastic syndrome with thrombocytopenia, HTN, Afib, chronic dCHF, CKD 4, who was admitted 7/7 with PNA and MSSA bacteremia r/t infected pacemaker wire.  He was treated with Ancef and rifampin.  Course has been c/b AKI on CKD (previously thought poor HD candidate) as well as large clot within the bladder (now with continuous bladder irrigation).  He underwent pacemaker removal 7/18 by vascular surgery. TEE negative for cardiac vegetation.  7/19 developed hypotension after am meds (including 4 anti-HTN and percocet per RN) and PCCM consulted.   Currently resting comfortably in bed.  Sleepy but easily arousable.  Oriented.  C/o L arm and L chest incisional pain.  Denies chest pain, SOB, dizziness, n/v.   SUBJECTIVE: Weaned off vasopressors overnight. Denies any chest pain or pressure. Denies any nausea or vomiting and is eating breakfast this morning.  REVIEW OF SYSTEMS:  No significant dyspnea or cough. No subjective fever, chills, or sweats. No headache or vision changes.  VITAL SIGNS: BP 144/67 mmHg  Pulse 78  Temp(Src) 98.9 F (37.2 C) (Oral)  Resp 11  Ht 5\' 10"  (1.778 m)  Wt 169 lb (76.658 kg)  BMI 24.25 kg/m2  SpO2 100%  HEMODYNAMICS: CVP:  [2 mmHg-6 mmHg] 2 mmHg  VENTILATOR SETTINGS:    INTAKE / OUTPUT: I/O last 3 completed shifts: In: 15447.4 [P.O.:640; I.V.:4322.4; Blood:335; Other:9000; IV Piggyback:1150] Out: [id number] [Urine:19300; Blood:300]  PHYSICAL EXAMINATION: General:  Elderly malee, MAE, oriented  HEENT:  No oral ulcers. MMM. No scleral icterus. Cardiovascular:  Regular rate. No JVD.  Lungs:  Slightly diminished breath sounds bilateral lung  bases. Normal work of breathing. Speaking in complete sentences. Abdomen:   soft. Mildly protuberant. Grossly nontender. Integument: Warm and dry. No rash on exposed skin.   LABS:  BMET  Recent Labs Lab 01/27/16 0910  01/27/16 2019 01/28/16 0536 01/29/16 0440  NA 133*  < > 136 135 135  136  K 4.2  < > 4.5 4.1 4.2  4.2  CL 106  --   --  109 112*  112*  CO2 18*  --   --  16* 16*  16*  BUN 126*  --   --  115* 117*  117*  CREATININE 5.92*  --   --  5.53* 5.46*  5.45*  GLUCOSE 179*  --   --  126* 116*  117*  < > = values in this interval not displayed.  Electrolytes  Recent Labs Lab 01/27/16 0910 01/28/16 0536 01/29/16 0440  CALCIUM 8.7* 8.2* 8.3*  8.3*  PHOS 4.7* 5.3* 5.2*    CBC  Recent Labs Lab 01/28/16 0008 01/28/16 1245 01/29/16 0440  WBC 13.3* 11.3* 10.1  HGB 8.8* 7.3* 8.0*  HCT 26.1* 21.9* 24.6*  PLT 264 215 264    Coag's No results for input(s): APTT, INR in the last 168 hours.  Sepsis Markers  Recent Labs Lab 01/28/16 1245 01/29/16 0440  LATICACIDVEN 0.6  --   PROCALCITON 0.96 0.80    ABG  Recent Labs Lab 01/27/16 1854 01/27/16 2019  PHART 7.218* 7.293*  PCO2ART 34.0* 27.6*  PO2ART 212.0* 88.0    Liver Enzymes  Recent Labs Lab 01/27/16 0910 01/28/16 0536  01/29/16 0440  ALBUMIN 2.1* 2.3* 2.1*    Cardiac Enzymes No results for input(s): TROPONINI, PROBNP in the last 168 hours.  Glucose  Recent Labs Lab 01/27/16 2314 01/28/16 0405 01/28/16 0758 01/28/16 1157 01/28/16 1602 01/28/16 2135  GLUCAP 178* 122* 123* 111* 160* 176*    Imaging No results found.   STUDIES:  TTE 7/19: severe LVH, grade 1 diastolic dysfunction, pacer wire in RA with mobile echodensity, PA pressure 81mmgHg  Port CXR 7/19:  R opacities unchanged.   MICROBIOLOGY: Blood Ctx x2 7/7:  2/2 bottles MSSA Sputum Ctx 7/7:  Oral [place] Sputum AFB Ctx 7/7>>> Blood Ctx x2 7/8:  Negative Sputum AFB Ctx 7/8>>> L Wrist Synovial Fluid 7/10:   Negative L Wrist Wound 7/12:  Negative MRSA PCR 7/18:  Negative   ANTIBIOTICS: Ancef 7/7>>>  Rifampin 7/8>>>  SIGNIFICANT EVENTS: 7/18 - vascular surgery removal of infected pacemaker  7/19 - consult to PCCM for hypotension  LINES/TUBES: R IJ CVL 7/18>>> FOLEY 7/17>>>  ASSESSMENT / PLAN:  80yo male with multiple medical problems admitted 7/7 with MSSA bacteremia from infected pacer wire.  Underwent removal on 7/19 by vascular surgery.  On 7/19 developed significant hypotension with SBP 70's likely r/t anti-HTN and pain medication. Patient's hypotension has resolved. Currently being followed for his MSSA bacteremia by infectious diseases. Case discussed with TR[facility] at bedside.  1. Shock:  Resolved. Secondary to medications. Continuing to hold anti-hypertensives for now. 2. MSSA Bacteremia:  ID following. Continuing Rifampin & Ancef per their recommendations. 3. Acute on Chronic Renal Failure:  Nephrology following.  Remainder of care as per primary service. PCCM will sign off at this time.  Sonia Baller Ashok Cordia, M.D. [facility]monary [facility] Pager:  [phone] After 3pm or if no response, call [phone]  01/29/2016  6:48 AM

## 2016-01-29 NOTE — Progress Notes (Signed)
Progress Note    [name]  IRJ:[id number] DOB: Jul 25, 1935  DOA: 01/16/2016 PCP: [name], MD    Brief Narrative:   [name]. is an 80 y.o. male with a PMH of paroxysmal atrial fibrillation/pacemaker not on anticoagulation secondary to history of GI bleed, CAD, stage IV CKD, CAD, hypertension,Liddle's syndrome, myelodysplastic syndrome, PVD and hyperlipidemia who was admitted 01/16/16 with multifocal pneumonia complicated by coagulopathy and hematuria. Blood cultures were positive for MSSA. Patient subsequently developed severe wrist pain and underwent arthrotomy of left wrist joint 01/21/16. Pseudogout suspected given negative synovial fluid cultures. Echocardiogram done which showed echodensity on RA lead, s/p PPM system extraction 7/18 by Dr. Lovena Le (Required vascular surgery to retrieve RV lead tip from RFV)  Assessment/Plan:   Staphylococcus aureus bacteremia with Evidence of vegetation on on atrial pacing lead pacemaker , and  right lower lobe pneumonia/Multifocal pneumonia - Blood culture growing MSSA on 01/16/2016, blood cultures 01/17/16 with no growth to date - Antibiotic management per ID, currently on rifampin and Ancef to finish total of 4 weeks through 02/12/2016 - s/p PPM system extraction 7/18 by Dr. Lovena Le (Required vascular surgery to retrieve RV lead tip from RFV), Per ID recs its safe to replace a new pacemaker 14 days after the first negative blood culture But currently no plan for EP to replace his pacemaker. - TEE done 01/26/16 with no evidence of vegetation,  - Palliative care consultation requested given overall failure to thrive, poor prognosis with progressive renal failure (does not want to consider HD and would likely not be a good candidate.  Hypotension/history of hypertension - patient is hypotensive 7/19, PC CM consulted, wearing brief pressor support, currently blood pressure acceptable of pressors, continue to hold  antihypertensive  medication include Coreg, Imdur, hydralazine and clonidine.  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 5 cm masslike area consistent with a blood clot.  - Evaluated by urology with a cystoscopy performed, status post bladder clot evacuation and bladder irrigation. Continues to have clots, on continuous bladder irrigation.  Coronary artery disease/chronic diastolic CHF  - Status post CABG. Aspirin/Plavix currently on hold secondary to hematuria and hemoptysis.  - Continue Lipitor,  - will hold Imdur and Coreg given hypotension.  - No evidence of heart failure exacerbation. Diuretics currently on hold.  PAF (paroxysmal atrial fibrillation) (HCC) status post pacemaker for sick sinus syndrome - CHA2DS2Vasc is at least 4, but not felt to be a candidate for anticoagulation given history of GI bleeding. Continue amiodarone.  Acute renal failure superimposed on stage IV chronic kidney disease/renal artery stenosis - Baseline creatinine 3.8. Creatinine rising. Differential remains broad with ATN, interstitial nephritis, postinfectious GN or progressive CKD all possible.  - Continue management per nephrology.  Wrist swelling secondary to probable pseudogout - Seen by orthopedics, status post incision and drainage 01/21/16. Sinoatrial Fluid cultures negative ,CPPD crystals noted, findings most consistent with pseudogout. Will follow-up with Dr. Grandville Silos. On prednisone, will start on taper.  Liddle's syndrome - Monitor for hypokalemia and high blood pressure.  Myelodysplastic syndrome/anemia of chronic disease - Gets Epogen injections every 2 weeks at the cancer center. Hemoglobin trending down. Continue to monitor. Hemoglobin is 7 today, given 1 unit of PRBCs 01/26/16. Resume Epogen. As we'll transfused 1 unit PRBC  Diabetes mellitus with peripheral artery disease - Usually diet-controlled at home, CBGs controlled on current regimen,  continue with insulin sliding scale, and 6 units NovoLog before meals .  Gastroparesis  Prostate cancer Gets Lupron injections every 4 months. S/P external beam radiation.   Family Communication/Anticipated D/C date and plan/Code Status   DVT prophylaxis: SCDs ordered. Code Status: Full Code.  Family CommunicationDiscussedth patient, none at bedside Disposition Plan:  likely several more days given current sepsis/pacemaker endocarditis.   Medical Consultants:    Cardiology  Orthopedic Surgery  Infectious Disease  Nephrology  Urology  PCCM  Palliative    Procedures:   - 1 unit PRBC transfusion on 7/17, and 1 unit on 7/18 - s/p PPM system extraction 7/18 by Dr. Lovena Le (Required vascular surgery to retrieve RV lead tip from RFV) - Right IJ TLC on 7/18   2 D Echo 01/24/16:  Dynamic obstruction with peak velocity of 351 cm/sec. no regional wall motion abnormalities. Grade 1 diastolic dysfunction. Mobile echodensity in the right atrium attached to pacer wire. Mild systolic pulmonary artery hypertension.  Dg Chest 2 View  01/16/2016  CLINICAL DATA:  Chest pain, cough for 2 days EXAM: CHEST  2 VIEW COMPARISON:  CT chest 04/03/2014 FINDINGS: Cardiomediastinal silhouette is unremarkable. Status post median sternotomy. Dual lead cardiac pacemaker with leads in right atrium and right ventricle. There is streaky airspace opacification in right base infrahilar region and right upper lobe peripheral perihilar. Findings highly suspicious for pneumonia. Probable small right pleural effusion. Left lung is clear. IMPRESSION: There is streaky airspace opacification in right base infrahilar region and right upper lobe peripheral perihilar. Findings highly suspicious for pneumonia. Followup PA and lateral chest X-ray is recommended in 3-4 weeks following trial of antibiotic therapy to ensure resolution and exclude underlying malignancy. Probable small right pleural effusion. Electronically Signed    By: Lahoma Crocker M.D.   On: 01/16/2016 13:58   Dg Wrist 2 Views Left  01/18/2016  CLINICAL DATA:  Acute onset of left wrist pain.  Initial encounter. EXAM: LEFT WRIST - 2 VIEW COMPARISON:  None. FINDINGS: There is no evidence of fracture or dislocation. There is joint space narrowing along the radiocarpal joint, and at the radial aspect of the carpal rows. This resolves in mild chronic deformity of the scaphoid and lunate, with expansion of the scapholunate distance to 5 mm. Scattered vascular calcifications are seen. No additional soft tissue abnormalities are characterized on radiograph. IMPRESSION: 1. No evidence of fracture or dislocation. 2. Degenerative joint space narrowing along the radiocarpal joints, and at the radial aspect of the carpal rows. This results in mild chronic deformity of the scaphoid and lunate, with expansion of the scapholunate distance to 5 mm, reflecting scapholunate dissociation. Electronically Signed   By: Garald Balding M.D.   On: 01/18/2016 16:57   Ct Chest Wo Contrast  01/16/2016  CLINICAL DATA:  Hemoptysis, cough.  Symptoms since Monday. EXAM: CT CHEST WITHOUT CONTRAST TECHNIQUE: TECHNIQUE Multidetector CT imaging of the chest was performed following the standard protocol without IV contrast. COMPARISON:  Chest radiograph 01/16/2016, CT 04/03/2014 FINDINGS: Cardiovascular: Calcification of the thoracic aorta. Coronary bypass graft. Pacer wires in the RIGHT heart. No pericardial fluid. Mediastinum/Nodes: No axillary or supraclavicular adenopathy. No mediastinal hilar adenopathy. Esophagus normal. Lungs/Pleura: There is consolidative airspace disease in the RIGHT lower lobe with air bronchograms. A similar pattern in the posterior aspect the RIGHT upper lobe. Smaller pattern of consolidation in the LEFT lower lobe. Findings are consistent with multifocal pneumonia. Upper Abdomen: Limited view of the liver, kidneys, pancreas are unremarkable. Normal adrenal glands. Musculoskeletal:  No aggressive osseous lesion.  Midline sternotomy. IMPRESSION: 1. Multifocal pneumonia with most  dense infection in RIGHT lower lobe. 2. Coronary artery calcification and aortic atherosclerotic calcification. Electronically Signed   By: Suzy Bouchard M.D.   On: 01/16/2016 16:32   US Renal  01/17/2016  CLINICAL DATA:  80 year old male with microscopic hematuria. Personal history of prostate cancer. Initial encounter. EXAM: RENAL / URINARY TRACT ULTRASOUND COMPLETE COMPARISON:  CT Abdomen and Pelvis 08/30/2015, and earlier. Right upper quadrant ultrasound from today reported separately. FINDINGS: Right Kidney: Length: 12.3 cm. Echogenicity within normal limits. No mass or hydronephrosis visualized. Left Kidney: Length: 12.0 cm. Echogenicity within normal limits. No mass or hydronephrosis visualized. Bladder: 5 cm area which is dependent, complex, and mass like (image 15), however, there is no internal vascularity detected with brief color Doppler interrogation. Elsewhere the urinary bladder appears normal. Prostate measured to be 5.2 x 3.1 x 4.3 cm. IMPRESSION: 1. Abnormal 5 cm complex, mass-like area of dependent echogenicity in the urinary bladder. However, lack of internal vascularity on Doppler suggests this is more likely blood clot rather than a bladder tumor. Further evaluation recommended. 2. Normal for age sonographic appearance of both kidneys. Electronically Signed   By: Genevie Ann M.D.   On: 01/17/2016 08:01   US Renal  01/09/2016  CLINICAL DATA:  Two weeks of hematuria ; history of prostate enlargement EXAM: RENAL / URINARY TRACT ULTRASOUND COMPLETE COMPARISON:  Abdominal pelvic CT scan of July 30, 2015 FINDINGS: Right Kidney: Length: 11.5 cm. The renal cortical echotexture is equal to or slightly greater than that of the adjacent liver. There is no hydronephrosis nor discrete mass. Left Kidney: Length: 10.3 cm. The renal cortical echotexture is mildly increased similar to that on the right. There  is no hydronephrosis. Bladder: There is an irregular mass in the posterior aspect of the urinary bladder wall measuring 2.5 x 2 x 4.4 cm. This appears separate from the more anteriorly and inferiorly positioned prostate gland. The prostate gland itself does produce a mild impression upon the urinary bladder base. IMPRESSION: 1. Posterior bladder wall mass worrisome for malignancy. This is separate from the mildly enlarged prostate gland. 2. Increased renal cortical echotexture bilaterally consistent with medical renal disease. There is no hydronephrosis. Electronically Signed   By: David  Martinique M.D.   On: 01/09/2016 14:44   Ir Fluoro Guide Cv Line Left  01/25/2016  INDICATION: Bacteremia due to Staph aureus infection. History of chronic renal insufficiency. Request made for placement of a tunneled PICC line for antibiotic administration. EXAM: TUNNELED PICC LINE WITH ULTRASOUND AND FLUOROSCOPIC GUIDANCE MEDICATIONS: The patient is currently admitted to the hospital and receiving intravenous antibiotics. The antibiotic was given in an appropriate time interval prior to skin puncture. ANESTHESIA/SEDATION: Moderate (conscious) sedation was employed during this procedure. A total of Versed 0.5 mg and Fentanyl 25 mcg was administered intravenously. Moderate Sedation Time: 33 minutes. The patient's level of consciousness and vital signs were monitored continuously by radiology nursing throughout the procedure under my direct supervision. COMPLICATIONS: None immediate. PROCEDURE: Informed written consent was obtained from the patient after a discussion of the risks, benefits, and alternatives to treatment. Questions regarding the procedure were encouraged and answered. Given the presence of the right anterior chest wall pacemaker, the decision was made to place a left internal jugular approach dual lumen PICC line. As such, the left neck and chest were prepped with chlorhexidine in a sterile fashion, and a sterile  drape was applied covering the operative field. Maximum barrier sterile technique with sterile gowns and gloves were used for the  procedure. A timeout was performed prior to the initiation of the procedure. After creating a small venotomy incision, a micropuncture kit was utilized to access the left internal jugular vein under direct, real-time ultrasound guidance after the overlying soft tissues were anesthetized with 1% lidocaine with epinephrine. Ultrasound image documentation was performed. Despite several attempts, there was difficulty advancing the micro wire centrally. As such, a limited central venogram was performed via the inner 3 French sheath from the micropuncture kit demonstrating patency though marked tortuosity of the central aspect of the left internal jugular vein. Ultimately, a Nitrex wire was able to be advanced centrally. The microwire was kinked to measure appropriate catheter length. The micropuncture sheath was exchanged for a peel-away sheath over a guidewire. Initially, a 5 [place] dual lumen tunneled PICC measuring 28 cm was tunneled in a retrograde fashion from the anterior chest wall to the venotomy incision. Unfortunately, the initial catheter was noted to retract within the tortuous left innominate vein with with tip terminating at the level of the SVC. As such, the existing PICC line was cannulated with a GT Glidewire which was advanced to the level of the IVC. Under intermittent fluoroscopic guidance, the existing 28 cm tunneled PICC line was exchanged for a new slightly longer 31 cm dual lumen tunneled PICC line with tip ultimately terminating within the superior aspect the right atrium. Final catheter positioning was confirmed and documented with a spot radiographic image. The catheter aspirates and flushes normally. The catheter was flushed with appropriate volume heparin dwells. The catheter exit site was secured with a 0-Prolene retention suture. The venotomy incision was closed  with Dermabond and Steri-strips. Dressings were applied. The patient tolerated the procedure well without immediate post procedural complication. FINDINGS: After catheter placement, the tip lies within the superior cavoatrial junction. The catheter aspirates and flushes normally and is ready for immediate use. IMPRESSION: Successful placement of 31 cm dual lumen tunneled PICC catheter via the left internal jugular vein with tip terminating at the superior caval atrial junction. The catheter is ready for immediate use. Electronically Signed   By: Sandi Mariscal M.D.   On: 01/25/2016 13:51   Ir US Guide Vasc Access Left  01/25/2016  INDICATION: Bacteremia due to Staph aureus infection. History of chronic renal insufficiency. Request made for placement of a tunneled PICC line for antibiotic administration. EXAM: TUNNELED PICC LINE WITH ULTRASOUND AND FLUOROSCOPIC GUIDANCE MEDICATIONS: The patient is currently admitted to the hospital and receiving intravenous antibiotics. The antibiotic was given in an appropriate time interval prior to skin puncture. ANESTHESIA/SEDATION: Moderate (conscious) sedation was employed during this procedure. A total of Versed 0.5 mg and Fentanyl 25 mcg was administered intravenously. Moderate Sedation Time: 33 minutes. The patient's level of consciousness and vital signs were monitored continuously by radiology nursing throughout the procedure under my direct supervision. COMPLICATIONS: None immediate. PROCEDURE: Informed written consent was obtained from the patient after a discussion of the risks, benefits, and alternatives to treatment. Questions regarding the procedure were encouraged and answered. Given the presence of the right anterior chest wall pacemaker, the decision was made to place a left internal jugular approach dual lumen PICC line. As such, the left neck and chest were prepped with chlorhexidine in a sterile fashion, and a sterile drape was applied covering the operative  field. Maximum barrier sterile technique with sterile gowns and gloves were used for the procedure. A timeout was performed prior to the initiation of the procedure. After creating a small venotomy incision, a micropuncture  kit was utilized to access the left internal jugular vein under direct, real-time ultrasound guidance after the overlying soft tissues were anesthetized with 1% lidocaine with epinephrine. Ultrasound image documentation was performed. Despite several attempts, there was difficulty advancing the micro wire centrally. As such, a limited central venogram was performed via the inner 3 French sheath from the micropuncture kit demonstrating patency though marked tortuosity of the central aspect of the left internal jugular vein. Ultimately, a Nitrex wire was able to be advanced centrally. The microwire was kinked to measure appropriate catheter length. The micropuncture sheath was exchanged for a peel-away sheath over a guidewire. Initially, a 5 [place] dual lumen tunneled PICC measuring 28 cm was tunneled in a retrograde fashion from the anterior chest wall to the venotomy incision. Unfortunately, the initial catheter was noted to retract within the tortuous left innominate vein with with tip terminating at the level of the SVC. As such, the existing PICC line was cannulated with a GT Glidewire which was advanced to the level of the IVC. Under intermittent fluoroscopic guidance, the existing 28 cm tunneled PICC line was exchanged for a new slightly longer 31 cm dual lumen tunneled PICC line with tip ultimately terminating within the superior aspect the right atrium. Final catheter positioning was confirmed and documented with a spot radiographic image. The catheter aspirates and flushes normally. The catheter was flushed with appropriate volume heparin dwells. The catheter exit site was secured with a 0-Prolene retention suture. The venotomy incision was closed with Dermabond and Steri-strips.  Dressings were applied. The patient tolerated the procedure well without immediate post procedural complication. FINDINGS: After catheter placement, the tip lies within the superior cavoatrial junction. The catheter aspirates and flushes normally and is ready for immediate use. IMPRESSION: Successful placement of 31 cm dual lumen tunneled PICC catheter via the left internal jugular vein with tip terminating at the superior caval atrial junction. The catheter is ready for immediate use. Electronically Signed   By: Sandi Mariscal M.D.   On: 01/25/2016 13:51   Dg Chest Port 1 View  01/28/2016  CLINICAL DATA:  Weakness, infected pacemaker EXAM: PORTABLE CHEST 1 VIEW COMPARISON:  Portable chest x-ray of January 27, 2016 FINDINGS: The left lung is well-expanded and clear. On the right there is mild volume loss with increased density in the upper and lower lobes which is stable. There is no pneumothorax. There is biapical pleural thickening the heart is normal in size. There is dense calcification in the aortic arch. The sternal wires are intact. The right internal jugular venous catheter tip projects over the distal third of the SVC just above the cavoatrial junction. IMPRESSION: 1. Persistent infiltrate in the right upper and lower lobes. Small right pleural effusion. No definite pneumothorax. 2. The left lung is clear.  No CHF.  Aortic atherosclerosis. Electronically Signed   By: David  Martinique M.D.   On: 01/28/2016 07:11   Dg Chest Portable 1 View  01/27/2016  CLINICAL DATA:  Right IJ placement EXAM: PORTABLE CHEST 1 VIEW COMPARISON:  January 16, 2016 FINDINGS: A right IJ has been placed in the interval, probably terminating just within the right side of the atrium, approximately 2 cm inferior to the caval atrial junction. No pneumothorax. Opacity remains in the right mid lung, less focal in the interval. Opacity and effusion remains in the left lung base, also less focal in the interval. No other interval changes.  IMPRESSION: 1. The distal tip of the new right IJ probably terminates approximately 2  cm below the caval atrial junction, just within the right side of the atrium. Recommend repositioning as clinically warranted. No pneumothorax. 2. The right-sided pulmonary infiltrates are less focal in the interval but remain. There is probably a small amount of layering effusion on the right as well. Electronically Signed   By: Gerome Sam III M.D   On: 01/27/2016 19:55   Dg Fluoro Guided Needle Plc Aspiration/injection Loc  01/21/2016  CLINICAL DATA:  Left wrist pain and swelling.  Sepsis. EXAM: LEFT WRIST ASPIRATION UNDER FLUOROSCOPY FLUOROSCOPY TIME:  Fluoroscopy Time (in minutes and seconds): 24 SECONDS PROCEDURE: Overlying skin prepped with Betadine, draped in the usual sterile fashion, and infiltrated locally with buffered Lidocaine. 20 gauge needle advanced into the radiocarpal joint under direct fluoroscopic visualization. Approximately 3 cc of bloody fluid was aspirated from the joint and overlying soft tissues. IMPRESSION: Left radiocarpal joint aspiration performed without immediate complication. Mr. Proudfoot with tolerated the procedure well. Technically successful  hip injection under fluoroscopy. Electronically Signed   By: Francene Boyers M.D.   On: 01/21/2016 08:10   US Abdomen Limited Ruq  01/17/2016  CLINICAL DATA:  80 year old male with abnormal LFTs. Prior cholecystectomy. Pneumonia. Initial encounter. EXAM: US ABDOMEN LIMITED - RIGHT UPPER QUADRANT COMPARISON:  Noncontrast chest CT 01/16/2016. CT Abdomen and Pelvis 08/30/2015 and earlier FINDINGS: Gallbladder: Surgically absent Common bile duct: Diameter: 6 mm, normal Liver: Chronic 2.6 cm left hepatic lobe cyst (image 18) does demonstrate mild wall thickening, however, this lesion is stable on multiple prior CTs back to 2011. Similar 3.1 cm right lobe cyst, also not significantly changed since 2011. Underlying hepatic echotexture is within normal  limits. No intrahepatic biliary ductal dilatation is evident. No new liver lesion identified. Other findings: Negative visible right kidney. No free fluid identified. IMPRESSION: No acute liver or biliary findings. 2-3 cm liver cysts which are mildly thick walled but benign, having not significantly changed since 2011. Electronically Signed   By: Odessa Fleming M.D.   On: 01/17/2016 07:56    Anti-Infectives:   Azithromycin 01/16/16---> 01/17/16 Rocephin 01/16/16---> 01/17/16 Ancef 01/17/16---> Rifampin 01/17/16 --->  Subjective:   [name] D Frances Sr. continues to feel weak and dizzy.    Objective:    Filed Vitals:   01/29/16 0600 01/29/16 0700 01/29/16 0800 01/29/16 0805  BP: 147/63 139/62 143/65   Pulse: 85 89    Temp:    99.5 F (37.5 C)  TempSrc:    Oral  Resp: 8 9    Height:      Weight:      SpO2: 98% 98%      Intake/Output Summary (Last 24 hours) at 01/29/16 1119 Last data filed at 01/29/16 0800  Gross per 24 hour  Intake 7934.85 ml  Output   6595 ml  Net 1339.85 ml   Filed Weights   01/25/16 0534 01/26/16 0628 01/28/16 0700  Weight: 76.25 kg (168 lb 1.6 oz) 72.394 kg (159 lb 9.6 oz) 76.658 kg (169 lb)    Exam: General exam: Weak,Laying in bed Respiratory system: Clear to auscultation. Respiratory effort normal. Cardiovascular system: HSIR/tachy. No JVD,  rubs, gallops or clicks. Grade 2/6 SEM. Gastrointestinal system: Abdomen is nondistended, soft and nontender. No organomegaly or masses felt. Normal bowel sounds heard. Central nervous system: Mildly lethargic. No focal neurological deficits. Extremities: Left hand markedly erythematous/swollen and tender with limited range of motion of fingers. Skin: No rashes, lesions or ulcers Psychiatry: Judgement and insight appear normal. Mood & affect appropriate.  Data Reviewed:   I have personally reviewed following labs and imaging studies:  Labs: Basic Metabolic Panel:  Recent Labs Lab 01/25/16 0311 01/26/16 0513  01/27/16 0910 01/27/16 1854 01/27/16 2019 01/28/16 0536 01/29/16 0440  NA 130* 131* 133* 134* 136 135 135  136  K 4.3 4.5 4.2 4.4 4.5 4.1 4.2  4.2  CL 101 102 106  --   --  109 112*  112*  CO2 20* 20* 18*  --   --  16* 16*  16*  GLUCOSE 154* 197* 179*  --   --  126* 116*  117*  BUN 114* 123* 126*  --   --  115* 117*  117*  CREATININE 5.12* 5.71* 5.92*  --   --  5.53* 5.46*  5.45*  CALCIUM 8.6* 8.2* 8.7*  --   --  8.2* 8.3*  8.3*  PHOS  --  4.1 4.7*  --   --  5.3* 5.2*   GFR Estimated Creatinine Clearance: 11.5 mL/min (by C-G formula based on Cr of 5.45).  CBC:  Recent Labs Lab 01/26/16 0513 01/27/16 0910 01/27/16 1854 01/27/16 2019 01/28/16 0008 01/28/16 1245 01/29/16 0440  WBC 8.6 14.1*  --   --  13.3* 11.3* 10.1  HGB 7.0* 9.1* 6.8* 8.5* 8.8* 7.3* 8.0*  HCT 20.5* 27.0* 20.0* 25.0* 26.1* 21.9* 24.6*  MCV 93.2 92.5  --   --  91.9 94.0 94.6  PLT 251 307  --   --  264 215 264   CBG:  Recent Labs Lab 01/28/16 0758 01/28/16 1157 01/28/16 1602 01/28/16 2135 01/29/16 0759  GLUCAP 123* 111* 160* 176* 125*   Microbiology Recent Results (from the past 240 hour(s))  Body fluid culture     Status: None   Collection Time: 01/19/16  4:37 PM  Result Value Ref Range Status   Specimen Description FLUID SYNOVIAL WRIST LEFT  Final   Special Requests NONE  Final   Gram Stain   Final    ABUNDANT WBC PRESENT,BOTH PMN AND MONONUCLEAR NO ORGANISMS SEEN    Culture NO GROWTH 3 DAYS  Final   Report Status 01/22/2016 FINAL  Final  Aerobic/Anaerobic Culture (surgical/deep wound)     Status: None   Collection Time: 01/21/16  6:52 PM  Result Value Ref Range Status   Specimen Description WOUND LEFT WRIST  Final   Special Requests NONE  Final   Gram Stain   Final    WBC PRESENT,BOTH PMN AND MONONUCLEAR NO ORGANISMS SEEN    Culture No growth aerobically or anaerobically.  Final   Report Status 01/26/2016 FINAL  Final  Surgical pcr screen     Status: Abnormal   Collection  Time: 01/27/16  1:12 PM  Result Value Ref Range Status   MRSA, PCR NEGATIVE NEGATIVE Final   Staphylococcus aureus POSITIVE (A) NEGATIVE Final    Comment:        The Xpert SA Assay (FDA approved for NASAL specimens in patients over 56 years of age), is one component of a comprehensive surveillance program.  Test performance has been validated by [facility] for patients greater than or equal to 29 year old. It is not intended to diagnose infection nor to guide or monitor treatment.     Radiology: Dg Chest Port 1 View  01/28/2016  CLINICAL DATA:  Weakness, infected pacemaker EXAM: PORTABLE CHEST 1 VIEW COMPARISON:  Portable chest x-ray of January 27, 2016 FINDINGS: The left lung is well-expanded and clear. On the right there is  mild volume loss with increased density in the upper and lower lobes which is stable. There is no pneumothorax. There is biapical pleural thickening the heart is normal in size. There is dense calcification in the aortic arch. The sternal wires are intact. The right internal jugular venous catheter tip projects over the distal third of the SVC just above the cavoatrial junction. IMPRESSION: 1. Persistent infiltrate in the right upper and lower lobes. Small right pleural effusion. No definite pneumothorax. 2. The left lung is clear.  No CHF.  Aortic atherosclerosis. Electronically Signed   By: David  Martinique M.D.   On: 01/28/2016 07:11   Dg Chest Portable 1 View  01/27/2016  CLINICAL DATA:  Right IJ placement EXAM: PORTABLE CHEST 1 VIEW COMPARISON:  January 16, 2016 FINDINGS: A right IJ has been placed in the interval, probably terminating just within the right side of the atrium, approximately 2 cm inferior to the caval atrial junction. No pneumothorax. Opacity remains in the right mid lung, less focal in the interval. Opacity and effusion remains in the left lung base, also less focal in the interval. No other interval changes. IMPRESSION: 1. The distal tip of the new right IJ  probably terminates approximately 2 cm below the caval atrial junction, just within the right side of the atrium. Recommend repositioning as clinically warranted. No pneumothorax. 2. The right-sided pulmonary infiltrates are less focal in the interval but remain. There is probably a small amount of layering effusion on the right as well. Electronically Signed   By: Dorise Bullion III M.D   On: 01/27/2016 19:55    Medications:   . sodium chloride   Intravenous Once  . amiodarone  100 mg Oral Daily  . atorvastatin  80 mg Oral QHS  . Calcifediol ER  30 mcg Oral QHS  .  ceFAZolin (ANCEF) IV  1 g Intravenous Q24H  . Chlorhexidine Gluconate Cloth  6 each Topical Daily  . insulin aspart  0-5 Units Subcutaneous QHS  . insulin aspart  0-9 Units Subcutaneous TID WC  . insulin aspart  6 Units Subcutaneous TID WC  . multivitamin with minerals  1 tablet Oral q morning - 10a  . mupirocin ointment  1 application Nasal BID  . polyethylene glycol  17 g Oral Daily  . predniSONE  50 mg Oral Q breakfast  . rifampin  600 mg Oral Daily  . tamsulosin  0.4 mg Oral QPC supper   Continuous Infusions: . sodium chloride 50 mL/hr at 01/28/16 2315  . norepinephrine (LEVOPHED) Adult infusion Stopped (01/28/16 1815)  . sodium chloride irrigation      Time spent: 35 minutes.  The patient is medically complex with multiple co-morbidities and is at high risk for clinical deterioration and requires high complexity decision making and coordination of care with multiple specialists.    LOS: 13 days   [facility], Elisabet Gutzmer MD [facility] Pager [phone].    *Please refer to [email or web address], password [email or web address] to get updated schedule on who will round on this patient, as hospitalists switch teams weekly. If 7PM-7AM, please contact night-coverage at [email or web address], password [email or web address] for any overnight needs.  01/29/2016, 11:19 AM

## 2016-01-29 NOTE — Progress Notes (Signed)
Met with Mr. Polak and his family.  - Patient reports again today that he is not interested in dialysis. - He would like to continue with current therapy, however, he does not want aggressive care in the event of cardiac or respiratory arrest. CODE STATUS updated to DO NOT RESUSCITATE. - He would like to continue with current therapy for another 48 hours to see how he responds. - Plan for another family meeting on Saturday at 3 PM (in 48 hours) to further discuss clinical course and continue conversation regarding goals of care.  Full consult note to follow.  Micheline Rough, MD [place] Team [phone]

## 2016-01-29 NOTE — Progress Notes (Signed)
I received call back from Mr. Dube wife.  We have set up a meeting for this afternoon at 1630.  Micheline Rough, MD [place] Team [phone]

## 2016-01-29 NOTE — Progress Notes (Signed)
Palliative Care MD, Dr. Domingo Cocking at bedside with multiple family members for family meeting.

## 2016-01-30 LAB — RENAL FUNCTION PANEL
ANION GAP: 9 (ref 5–15)
Albumin: 2 g/dL — ABNORMAL LOW (ref 3.5–5.0)
BUN: 109 mg/dL — AB (ref 6–20)
CALCIUM: 8.2 mg/dL — AB (ref 8.9–10.3)
CO2: 15 mmol/L — ABNORMAL LOW (ref 22–32)
CREATININE: 4.96 mg/dL — AB (ref 0.61–1.24)
Chloride: 112 mmol/L — ABNORMAL HIGH (ref 101–111)
GFR, EST AFRICAN AMERICAN: 12 mL/min — AB (ref >60)
GFR, EST NON AFRICAN AMERICAN: 10 mL/min — AB (ref >60)
Glucose, Bld: 105 mg/dL — ABNORMAL HIGH (ref 65–99)
PHOSPHORUS: 4.7 mg/dL — AB (ref 2.5–4.6)
POTASSIUM: 3.8 mmol/L (ref 3.5–5.1)
Sodium: 136 mmol/L (ref 135–145)

## 2016-01-30 LAB — TYPE AND SCREEN
ABO/RH(D): O POS
ANTIBODY SCREEN: NEGATIVE
UNIT DIVISION: 0
Unit division: 0
Unit division: 0

## 2016-01-30 LAB — GLUCOSE, CAPILLARY
Glucose-Capillary: 112 mg/dL — ABNORMAL HIGH (ref 65–99)
Glucose-Capillary: 194 mg/dL — ABNORMAL HIGH (ref 65–99)
Glucose-Capillary: 189 mg/dL — ABNORMAL HIGH (ref 65–99)
Glucose-Capillary: 140 mg/dL — ABNORMAL HIGH (ref 65–99)

## 2016-01-30 LAB — PROCALCITONIN: PROCALCITONIN: 0.59 ng/mL

## 2016-01-30 MED ORDER — CARVEDILOL 6.25 MG PO TABS
6.2500 mg | ORAL_TABLET | Freq: Two times a day (BID) | ORAL | Status: DC
  Administered 2016-01-30 – 2016-01-31 (×3): 6.25 mg via ORAL
  Filled 2016-01-30 (×3): qty 1

## 2016-01-30 MED ORDER — POLYETHYLENE GLYCOL 3350 17 G PO PACK
17.0000 g | PACK | Freq: Every day | ORAL | Status: DC
  Administered 2016-02-01 – 2016-02-02 (×2): 17 g via ORAL
  Filled 2016-01-30 (×2): qty 1

## 2016-01-30 MED ORDER — SENNOSIDES-DOCUSATE SODIUM 8.6-50 MG PO TABS
2.0000 | ORAL_TABLET | Freq: Two times a day (BID) | ORAL | Status: DC
  Administered 2016-01-30 – 2016-02-02 (×6): 2 via ORAL
  Filled 2016-01-30 (×8): qty 2

## 2016-01-30 MED ORDER — BISACODYL 10 MG RE SUPP
10.0000 mg | Freq: Two times a day (BID) | RECTAL | Status: AC
Start: 2016-01-30 — End: 2016-01-31

## 2016-01-30 MED ORDER — PREDNISONE 20 MG PO TABS
20.0000 mg | ORAL_TABLET | Freq: Every day | ORAL | Status: DC
  Administered 2016-01-31 – 2016-02-02 (×3): 20 mg via ORAL
  Filled 2016-01-30: qty 2
  Filled 2016-01-30 (×3): qty 1

## 2016-01-30 MED ORDER — STERILE WATER FOR INJECTION IV SOLN
150.0000 meq | INTRAVENOUS | Status: DC
  Administered 2016-01-30: 150 meq via INTRAVENOUS
  Filled 2016-01-30 (×4): qty 850

## 2016-01-30 MED ORDER — POLYETHYLENE GLYCOL 3350 17 G PO PACK
34.0000 g | PACK | Freq: Two times a day (BID) | ORAL | Status: AC
  Administered 2016-01-30 – 2016-01-31 (×2): 34 g via ORAL
  Filled 2016-01-30 (×3): qty 2

## 2016-01-30 MED ORDER — ISOSORBIDE MONONITRATE ER 30 MG PO TB24
15.0000 mg | ORAL_TABLET | Freq: Every day | ORAL | Status: DC
  Administered 2016-01-30 – 2016-01-31 (×2): 15 mg via ORAL
  Filled 2016-01-30 (×2): qty 1

## 2016-01-30 MED ORDER — INSULIN GLARGINE 100 UNIT/ML ~~LOC~~ SOLN
5.0000 [IU] | Freq: Every day | SUBCUTANEOUS | Status: DC
  Administered 2016-01-30 – 2016-02-01 (×3): 5 [IU] via SUBCUTANEOUS
  Filled 2016-01-30 (×3): qty 0.05

## 2016-01-30 MED FILL — Calcifediol Cap ER 30 MCG: ORAL | Qty: 1 | Status: AC

## 2016-01-30 NOTE — Clinical Social Work Note (Signed)
Patient transferred from 2s to [place]. 2C CSW notified. Patient would like placement with [facility].    [place], LCSW [phone] signing off.

## 2016-01-30 NOTE — Progress Notes (Signed)
    [name] for Infectious Disease   Reason for visit: Follow up on pacemaker lead vegetation with MSSA bacteremia  Interval History: repeated blood cultures remain negative; afebrile.  TEE without cardiac vegetation. PM removal and events noted.  Now in [name].    Physical Exam: Constitutional:  Filed Vitals:   01/30/16 1430 01/30/16 1700  BP: 159/88   Pulse: 91   Temp:  98 F (36.7 C)  Resp: 14    patient appears in NAD Respiratory: Normal respiratory effort; CTA B Cardiovascular: RRR  Review of Systems: Constitutional: negative for fevers and chills Cardiovascular: negative for chest pain Gastrointestinal: negative for diarrhea  Lab Results  Component Value Date   WBC 10.1 01/29/2016   HGB 8.0* 01/29/2016   HCT 24.6* 01/29/2016   MCV 94.6 01/29/2016   PLT 264 01/29/2016    Lab Results  Component Value Date   CREATININE 4.96* 01/30/2016   BUN 109* 01/30/2016   NA 136 01/30/2016   K 3.8 01/30/2016   CL 112* 01/30/2016   CO2 15* 01/30/2016    Lab Results  Component Value Date   ALT 12* 01/16/2016   AST 30 01/16/2016   ALKPHOS 27* 01/16/2016     Microbiology: Recent Results (from the past 240 hour(s))  Aerobic/Anaerobic Culture (surgical/deep wound)     Status: None   Collection Time: 01/21/16  6:52 PM  Result Value Ref Range Status   Specimen Description WOUND LEFT WRIST  Final   Special Requests NONE  Final   Gram Stain   Final    WBC PRESENT,BOTH PMN AND MONONUCLEAR NO ORGANISMS SEEN    Culture No growth aerobically or anaerobically.  Final   Report Status 01/26/2016 FINAL  Final  Surgical pcr screen     Status: Abnormal   Collection Time: 01/27/16  1:12 PM  Result Value Ref Range Status   MRSA, PCR NEGATIVE NEGATIVE Final   Staphylococcus aureus POSITIVE (A) NEGATIVE Final    Comment:        The Xpert SA Assay (FDA approved for NASAL specimens in patients over 78 years of age), is one component of a comprehensive surveillance program.   Test performance has been validated by [facility] for patients greater than or equal to 88 year old. It is not intended to diagnose infection nor to guide or monitor treatment.     Impression/Plan:  1. Pacemaker vegetation - removed.  I agree with replacement on or after 7/22.  Cefazolin and rifampin.  2. Bacteremia - from #1.  Cleared blood cultures.  Will need IV cefazolin through 02/12/2016.   3.  Palliative care discussion ongoing.   Dr Johnnye Sima is available over the weekend if needed, otherwise I will follow up on Monday. thanks

## 2016-01-30 NOTE — Progress Notes (Signed)
Pharmacy Antibiotic Note  [name]. is a 80 y.o. male admitted on 01/16/2016 with multifocal PNA per chest CT and  MSSA bacteremia. Pacemaker was removed 7/18. TEE negative for vegetation.  Pharmacy was consulted on 01/17/16 for cefazolin dosing (also on rifampin per MD). Per ID plans to continue ancef until 02/12/2016  Continues with poor renal function, not on HD.  Plan: -Continue Ancef 1g IV q24h -Continue Rifampin 600mg  PO q24h per MD -Will follow changes to renal function, repeat cx, plans for pacemaker replacement (can be done after 7/22), GOC discussions  Height: 5\' 10"  (177.8 cm) Weight: 169 lb (76.658 kg) IBW/kg (Calculated) : 73  Temp (24hrs), Avg:98.6 F (37 C), Min:98.4 F (36.9 C), Max:98.8 F (37.1 C)   Recent Labs Lab 01/26/16 0513 01/27/16 0910 01/28/16 0008 01/28/16 0536 01/28/16 1245 01/29/16 0440 01/30/16 0400  WBC 8.6 14.1* 13.3*  --  11.3* 10.1  --   CREATININE 5.71* 5.92*  --  5.53*  --  5.46*  5.45* 4.96*  LATICACIDVEN  --   --   --   --  0.6  --   --     Estimated Creatinine Clearance: 12.7 mL/min (by C-G formula based on Cr of 4.96).    Allergies  Allergen Reactions  . Nsaids Other (See Comments)    GI issue  . Ibuprofen Other (See Comments)    GI Issues  . Ace Inhibitors Cough    Antimicrobials this admission: Azith 7/7 x1 Rocephin 7/7 x1 Cefazolin 7/8 >> (8/3) Rifampin 7/8 >> (8/3)  Dose adjustments this admission: 7/16: Ancef decreased to 1g IV q24h  Microbiology results: 7/7 BCx x2: MSSA 2/2 7/8 BCx x2: neg 7/10 left wrist synovial fluid cx: neg 7/12 wound left wrist: neg 7/18 MRSA PCR: pos for staph aureus  [name], PharmD, BCPS Clinical Pharmacist Pager: [phone] 01/30/2016 11:20 AM

## 2016-01-30 NOTE — Progress Notes (Signed)
Patient ID: [name]., male   DOB: Jan 21, 1936, 79 y.o.   MRN: [id number] S:Doesn't feel that well today.  Discussion with palliative care noted as well as code status change. O:BP 170/73 mmHg  Pulse 89  Temp(Src) 98.8 F (37.1 C) (Oral)  Resp 22  Ht '5\' 10"'$  (1.778 m)  Wt 76.658 kg (169 lb)  BMI 24.25 kg/m2  SpO2 98%  Intake/Output Summary (Last 24 hours) at 01/30/16 0955 Last data filed at 01/30/16 0800  Gross per 24 hour  Intake   1775 ml  Output   2955 ml  Net  -1180 ml   Intake/Output: I/O last 3 completed shifts: In: 5885 [P.O.:1060; I.V.:1775; Other:3000; IV Piggyback:50] Out: 7300 [Urine:7300]  Intake/Output this shift:  Total I/O In: 50 [I.V.:50] Out: -  Weight change:  UTM:LYYTKPT AAM who appears very fatigued. CVS:no rub Resp:cta WSF:KCLEXN Ext:+edema   Recent Labs Lab 01/24/16 0317 01/25/16 0311 01/26/16 0513 01/27/16 0910 01/27/16 1854 01/27/16 2019 01/28/16 0536 01/29/16 0440 01/30/16 0400  NA 131* 130* 131* 133* 134* 136 135 135  136 136  K 4.2 4.3 4.5 4.2 4.4 4.5 4.1 4.2  4.2 3.8  CL 102 101 102 106  --   --  109 112*  112* 112*  CO2 20* 20* 20* 18*  --   --  16* 16*  16* 15*  GLUCOSE 192* 154* 197* 179*  --   --  126* 116*  117* 105*  BUN 100* 114* 123* 126*  --   --  115* 117*  117* 109*  CREATININE 4.20* 5.12* 5.71* 5.92*  --   --  5.53* 5.46*  5.45* 4.96*  ALBUMIN  --   --  2.0* 2.1*  --   --  2.3* 2.1* 2.0*  CALCIUM 8.7* 8.6* 8.2* 8.7*  --   --  8.2* 8.3*  8.3* 8.2*  PHOS  --   --  4.1 4.7*  --   --  5.3* 5.2* 4.7*   Liver Function Tests:  Recent Labs Lab 01/28/16 0536 01/29/16 0440 01/30/16 0400  ALBUMIN 2.3* 2.1* 2.0*   No results for input(s): LIPASE, AMYLASE in the last 168 hours. No results for input(s): AMMONIA in the last 168 hours. CBC:  Recent Labs Lab 01/26/16 0513 01/27/16 0910  01/28/16 0008 01/28/16 1245 01/29/16 0440  WBC 8.6 14.1*  --  13.3* 11.3* 10.1  HGB 7.0* 9.1*  < > 8.8* 7.3* 8.0*   HCT 20.5* 27.0*  < > 26.1* 21.9* 24.6*  MCV 93.2 92.5  --  91.9 94.0 94.6  PLT 251 307  --  264 215 264  < > = values in this interval not displayed. Cardiac Enzymes:  Recent Labs Lab 01/25/16 1519  CKTOTAL 60   CBG:  Recent Labs Lab 01/29/16 0759 01/29/16 1207 01/29/16 1645 01/29/16 2013 01/30/16 0808  GLUCAP 125* 197* 204* 238* 112*    Iron Studies: No results for input(s): IRON, TIBC, TRANSFERRIN, FERRITIN in the last 72 hours. Studies/Results: No results found. . sodium chloride   Intravenous Once  . amiodarone  100 mg Oral Daily  . atorvastatin  80 mg Oral QHS  . Calcifediol ER  30 mcg Oral QHS  . carvedilol  6.25 mg Oral BID WC  .  ceFAZolin (ANCEF) IV  1 g Intravenous Q24H  . Chlorhexidine Gluconate Cloth  6 each Topical Daily  . insulin aspart  0-5 Units Subcutaneous QHS  . insulin aspart  0-9 Units Subcutaneous TID WC  .  insulin aspart  6 Units Subcutaneous TID WC  . insulin glargine  5 Units Subcutaneous Daily  . isosorbide mononitrate  15 mg Oral Daily  . multivitamin with minerals  1 tablet Oral q morning - 10a  . mupirocin ointment  1 application Nasal BID  . polyethylene glycol  17 g Oral Daily  . [START ON 01/31/2016] predniSONE  20 mg Oral Q breakfast  . rifampin  600 mg Oral Daily  . tamsulosin  0.4 mg Oral QPC supper    BMET    Component Value Date/Time   NA 136 01/30/2016 0400   NA 146* 08/10/2013 1324   K 3.8 01/30/2016 0400   K 3.9 08/10/2013 1324   CL 112* 01/30/2016 0400   CO2 15* 01/30/2016 0400   CO2 27 08/10/2013 1324   GLUCOSE 105* 01/30/2016 0400   GLUCOSE 105 08/10/2013 1324   BUN 109* 01/30/2016 0400   BUN 40.3* 08/10/2013 1324   CREATININE 4.96* 01/30/2016 0400   CREATININE 5.79* 11/07/2015 1440   CREATININE 2.6* 08/10/2013 1324   CALCIUM 8.2* 01/30/2016 0400   CALCIUM 9.4 08/10/2013 1324   GFRNONAA 10* 01/30/2016 0400   GFRAA 12* 01/30/2016 0400   CBC    Component Value Date/Time   WBC 10.1 01/29/2016 0440   WBC  3.3* 01/12/2016 1007   RBC 2.60* 01/29/2016 0440   RBC 2.80* 01/12/2016 1007   RBC 2.64* 03/17/2013 0534   HGB 8.0* 01/29/2016 0440   HGB 9.3* 01/12/2016 1007   HCT 24.6* 01/29/2016 0440   HCT 28.3* 01/12/2016 1007   PLT 264 01/29/2016 0440   PLT 88* 01/12/2016 1007   MCV 94.6 01/29/2016 0440   MCV 101.1* 01/12/2016 1007   MCH 30.8 01/29/2016 0440   MCH 33.2 01/12/2016 1007   MCHC 32.5 01/29/2016 0440   MCHC 32.9 01/12/2016 1007   RDW 18.7* 01/29/2016 0440   RDW 16.9* 01/12/2016 1007   LYMPHSABS 0.8 01/16/2016 1239   LYMPHSABS 0.9 01/12/2016 1007   MONOABS 1.4* 01/16/2016 1239   MONOABS 0.6 01/12/2016 1007   EOSABS 0.0 01/16/2016 1239   EOSABS 0.3 01/12/2016 1007   BASOSABS 0.0 01/16/2016 1239   BASOSABS 0.0 01/12/2016 1007     Assessment/Plan: 1. AKI/CKD- in very ill, elderly male with ongoing MSSA bacteremia and likely pacemaker wire involvement and possible endocarditis. DDx includes ischemic ATN in setting of severe illness, interstitial nephritis from abx, post-infectious GN, or progressive CKD given his advanced CKD at baseline. Discussed this with mr. Preast and his family. He admits that he did not have positive feelings about hemodialysis before this hospitalization and is not currently interested at this time.  1. He is markedly debilitated and agree with his decision not to pursue renal replacement therapy as he will not do well with hemodialysis.  2. workup for AKI/CKD thus far most consistent with pre-renal causes (low FeNa, normal complements, negative ASO) and continue with conservative therapy for now.  3. BUN/Scr improved slightly over the last 48 hours. 4. He remains consistent about not proceeding with dialysis which would not improve his overall prognosis or quality of life. 5. Appreciate palliative care speaking with patient and family. 2. MSSA bacteremia with involvement of pacemaker wire in RA.  1. Changed dose of ancef for advanced CKD (his  eGFR <50m/min) to 1 gram daily (which may have contributed to his AKI/CKD) 2. TEE without vegetations  3. S/p pacemaker lead removal complicated by lead breakage and required vascular surgery consult to remove from right  femoral vein. 3. 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 Continuous bladder irrigations started 01/27/16 until today with some improvement of gross hematuria 4. Prostate cancer s/p xrt and lupron 5. Myelodysplastic syndrome/Anemia of chronic disease and acute illness. Was on procrit prior to admission. 1. Transfusions prn (if needed would give each unit PRBC's over 3 hours with lasix '40mg'$  IV between units to prevent flash pulm edema) 6. Hyponatremia - due to #1 as well as poor po intake. Improving with IVF's. 7. CAD- cardiology following 8. 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 10. Metabolic acidosis- due to #1. Will add bicarb  11. Disposition- poor overall prognosis in an elderly, debilitated man with multiple chronic issues. He is not interested in HD and appreciate palliative care consult 01/29/16 to help set goals/limits of care.  He is now DNR.  [place]

## 2016-01-30 NOTE — Consult Note (Signed)
   [facility] CM Inpatient Consult   01/30/2016  [name] Sr. [birth year] [id number]    Referral received for [place] [facility] services. Chart reviewed. Noted patient has ongoing discussions with Palliative Medicine and prior plan has been for SNF. Will continue to follow and engage for [place] [facility] services if appropriate and plan is for home.  Marthenia Rolling, MSN-Ed, RN,BSN [facility] Liaison [phone]

## 2016-01-30 NOTE — Progress Notes (Addendum)
Progress Note    [name]  SPQ:[id number] DOB: 10/25/1935  DOA: 01/16/2016 PCP: [name], MD    Brief Narrative:   [name] Skibicki. is an 80 y.o. male with a PMH of paroxysmal atrial fibrillation/pacemaker not on anticoagulation secondary to history of GI bleed, CAD, stage IV CKD, CAD, hypertension,Liddle's syndrome, myelodysplastic syndrome, PVD and hyperlipidemia who was admitted 01/16/16 with multifocal pneumonia complicated by coagulopathy and hematuria. Blood cultures were positive for MSSA. Patient subsequently developed severe wrist pain and underwent arthrotomy of left wrist joint 01/21/16. Pseudogout suspected given negative synovial fluid cultures. Echocardiogram done which showed echodensity on RA lead, s/p PPM system extraction 7/18 by Dr. Lovena Le (Required vascular surgery to retrieve RV lead tip from RFV)  Assessment/Plan:   Staphylococcus aureus bacteremia with Evidence of vegetation on on atrial pacing lead pacemaker , and  right lower lobe pneumonia/Multifocal pneumonia - Blood culture growing MSSA on 01/16/2016, blood cultures 01/17/16 with no growth to date - Antibiotic management per ID, currently on rifampin and Ancef to finish total of 4 weeks through 02/12/2016 - s/p PPM system extraction 7/18 by Dr. Lovena Le (Required vascular surgery to retrieve RV lead tip from RFV), Per ID recs its safe to replace a new pacemaker 14 days after the first negative blood culture But currently no plan for EP to replace his pacemaker. - TEE done 01/26/16 with no evidence of vegetation,  - Palliative care consultation requested given overall failure to thrive, poor prognosis with progressive renal failure (does not want to consider HD and would likely not be a good candidate.  Hypotension/history of hypertension - patient is hypotensive 7/19, PC CM consulted, wearing brief pressor support, currently blood pressure acceptable of pressors, Initially holding  antihypertensive  medication include Coreg, Imdur, hydralazine and clonidine, will resume on low-dose Coreg, Imdur, continue to hold hydralazine and clonidine.  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 5 cm masslike area consistent with a blood clot.  - Evaluated by urology with a cystoscopy performed, status post bladder clot evacuation and bladder irrigation. Management per urology, initially on CBI .  Coronary artery disease/chronic diastolic CHF  - Status post CABG. Aspirin/Plavix currently on hold secondary to hematuria and hemoptysis.  - Continue Lipitor,  - Resume Imdur and Coreg giving improving hypotension - No evidence of heart failure exacerbation. Diuretics currently on hold.  PAF (paroxysmal atrial fibrillation) (HCC) status post pacemaker for sick sinus syndrome - CHA2DS2Vasc is at least 4, but not felt to be a candidate for anticoagulation given history of GI bleeding. Continue amiodarone.  Acute renal failure superimposed on stage IV chronic kidney disease/renal artery stenosis - Baseline creatinine 3.8. Creatinine rising. Differential remains broad with ATN, interstitial nephritis, postinfectious GN or progressive CKD all possible.  - Continue management per nephrology.  Wrist swelling secondary to probable pseudogout - Seen by orthopedics, status post incision and drainage 01/21/16. Sinoatrial Fluid cultures negative ,CPPD crystals noted, findings most consistent with pseudogout. Will follow-up with Dr. Grandville Silos. On prednisone, will start on taper.  Liddle's syndrome - Monitor for hypokalemia and high blood pressure.  Myelodysplastic syndrome/anemia of chronic disease - Gets Epogen injections every 2 weeks at the cancer center. Hemoglobin trending down. Continue to monitor. Hemoglobin is 7 today, given 1 unit of PRBCs 01/26/16. Resume Epogen. As we'll transfused 1 unit PRBC  Diabetes mellitus with peripheral artery disease -  Usually diet-controlled at home, continue with insulin sliding scale,  and  6 units NovoLog before meals , will start on low-dose Lantus.  Gastroparesis  Prostate cancer Gets Lupron injections every 4 months. S/P external beam radiation.   Family Communication/Anticipated D/C date and plan/Code Status   DVT prophylaxis: SCDs ordered. Code Status: DNR Family CommunicationDiscussedth patient, none at bedside Disposition Plan:  likely several more days given current sepsis/pacemaker endocarditis.   Medical Consultants:    Cardiology  Orthopedic Surgery  Infectious Disease  Nephrology  Urology  PCCM  Palliative    Procedures:   - 1 unit PRBC transfusion on 7/17, and 1 unit on 7/18 - s/p PPM system extraction 7/18 by Dr. Lovena Le (Required vascular surgery to retrieve RV lead tip from RFV) - Right IJ TLC on 7/18   2 D Echo 01/24/16:  Dynamic obstruction with peak velocity of 351 cm/sec. no regional wall motion abnormalities. Grade 1 diastolic dysfunction. Mobile echodensity in the right atrium attached to pacer wire. Mild systolic pulmonary artery hypertension.  Dg Chest 2 View  01/16/2016  CLINICAL DATA:  Chest pain, cough for 2 days EXAM: CHEST  2 VIEW COMPARISON:  CT chest 04/03/2014 FINDINGS: Cardiomediastinal silhouette is unremarkable. Status post median sternotomy. Dual lead cardiac pacemaker with leads in right atrium and right ventricle. There is streaky airspace opacification in right base infrahilar region and right upper lobe peripheral perihilar. Findings highly suspicious for pneumonia. Probable small right pleural effusion. Left lung is clear. IMPRESSION: There is streaky airspace opacification in right base infrahilar region and right upper lobe peripheral perihilar. Findings highly suspicious for pneumonia. Followup PA and lateral chest X-ray is recommended in 3-4 weeks following trial of antibiotic therapy to ensure resolution and exclude underlying malignancy.  Probable small right pleural effusion. Electronically Signed   By: Lahoma Crocker M.D.   On: 01/16/2016 13:58   Dg Wrist 2 Views Left  01/18/2016  CLINICAL DATA:  Acute onset of left wrist pain.  Initial encounter. EXAM: LEFT WRIST - 2 VIEW COMPARISON:  None. FINDINGS: There is no evidence of fracture or dislocation. There is joint space narrowing along the radiocarpal joint, and at the radial aspect of the carpal rows. This resolves in mild chronic deformity of the scaphoid and lunate, with expansion of the scapholunate distance to 5 mm. Scattered vascular calcifications are seen. No additional soft tissue abnormalities are characterized on radiograph. IMPRESSION: 1. No evidence of fracture or dislocation. 2. Degenerative joint space narrowing along the radiocarpal joints, and at the radial aspect of the carpal rows. This results in mild chronic deformity of the scaphoid and lunate, with expansion of the scapholunate distance to 5 mm, reflecting scapholunate dissociation. Electronically Signed   By: Garald Balding M.D.   On: 01/18/2016 16:57   Ct Chest Wo Contrast  01/16/2016  CLINICAL DATA:  Hemoptysis, cough.  Symptoms since Monday. EXAM: CT CHEST WITHOUT CONTRAST TECHNIQUE: TECHNIQUE Multidetector CT imaging of the chest was performed following the standard protocol without IV contrast. COMPARISON:  Chest radiograph 01/16/2016, CT 04/03/2014 FINDINGS: Cardiovascular: Calcification of the thoracic aorta. Coronary bypass graft. Pacer wires in the RIGHT heart. No pericardial fluid. Mediastinum/Nodes: No axillary or supraclavicular adenopathy. No mediastinal hilar adenopathy. Esophagus normal. Lungs/Pleura: There is consolidative airspace disease in the RIGHT lower lobe with air bronchograms. A similar pattern in the posterior aspect the RIGHT upper lobe. Smaller pattern of consolidation in the LEFT lower lobe. Findings are consistent with multifocal pneumonia. Upper Abdomen: Limited view of the liver, kidneys,  pancreas are unremarkable. Normal adrenal glands. Musculoskeletal: No aggressive osseous  lesion.  Midline sternotomy. IMPRESSION: 1. Multifocal pneumonia with most dense infection in RIGHT lower lobe. 2. Coronary artery calcification and aortic atherosclerotic calcification. Electronically Signed   By: Suzy Bouchard M.D.   On: 01/16/2016 16:32   US Renal  01/17/2016  CLINICAL DATA:  80 year old male with microscopic hematuria. Personal history of prostate cancer. Initial encounter. EXAM: RENAL / URINARY TRACT ULTRASOUND COMPLETE COMPARISON:  CT Abdomen and Pelvis 08/30/2015, and earlier. Right upper quadrant ultrasound from today reported separately. FINDINGS: Right Kidney: Length: 12.3 cm. Echogenicity within normal limits. No mass or hydronephrosis visualized. Left Kidney: Length: 12.0 cm. Echogenicity within normal limits. No mass or hydronephrosis visualized. Bladder: 5 cm area which is dependent, complex, and mass like (image 15), however, there is no internal vascularity detected with brief color Doppler interrogation. Elsewhere the urinary bladder appears normal. Prostate measured to be 5.2 x 3.1 x 4.3 cm. IMPRESSION: 1. Abnormal 5 cm complex, mass-like area of dependent echogenicity in the urinary bladder. However, lack of internal vascularity on Doppler suggests this is more likely blood clot rather than a bladder tumor. Further evaluation recommended. 2. Normal for age sonographic appearance of both kidneys. Electronically Signed   By: Genevie Ann M.D.   On: 01/17/2016 08:01   US Renal  01/09/2016  CLINICAL DATA:  Two weeks of hematuria ; history of prostate enlargement EXAM: RENAL / URINARY TRACT ULTRASOUND COMPLETE COMPARISON:  Abdominal pelvic CT scan of July 30, 2015 FINDINGS: Right Kidney: Length: 11.5 cm. The renal cortical echotexture is equal to or slightly greater than that of the adjacent liver. There is no hydronephrosis nor discrete mass. Left Kidney: Length: 10.3 cm. The renal cortical  echotexture is mildly increased similar to that on the right. There is no hydronephrosis. Bladder: There is an irregular mass in the posterior aspect of the urinary bladder wall measuring 2.5 x 2 x 4.4 cm. This appears separate from the more anteriorly and inferiorly positioned prostate gland. The prostate gland itself does produce a mild impression upon the urinary bladder base. IMPRESSION: 1. Posterior bladder wall mass worrisome for malignancy. This is separate from the mildly enlarged prostate gland. 2. Increased renal cortical echotexture bilaterally consistent with medical renal disease. There is no hydronephrosis. Electronically Signed   By: David  Martinique M.D.   On: 01/09/2016 14:44   Ir Fluoro Guide Cv Line Left  01/25/2016  INDICATION: Bacteremia due to Staph aureus infection. History of chronic renal insufficiency. Request made for placement of a tunneled PICC line for antibiotic administration. EXAM: TUNNELED PICC LINE WITH ULTRASOUND AND FLUOROSCOPIC GUIDANCE MEDICATIONS: The patient is currently admitted to the hospital and receiving intravenous antibiotics. The antibiotic was given in an appropriate time interval prior to skin puncture. ANESTHESIA/SEDATION: Moderate (conscious) sedation was employed during this procedure. A total of Versed 0.5 mg and Fentanyl 25 mcg was administered intravenously. Moderate Sedation Time: 33 minutes. The patient's level of consciousness and vital signs were monitored continuously by radiology nursing throughout the procedure under my direct supervision. COMPLICATIONS: None immediate. PROCEDURE: Informed written consent was obtained from the patient after a discussion of the risks, benefits, and alternatives to treatment. Questions regarding the procedure were encouraged and answered. Given the presence of the right anterior chest wall pacemaker, the decision was made to place a left internal jugular approach dual lumen PICC line. As such, the left neck and chest were  prepped with chlorhexidine in a sterile fashion, and a sterile drape was applied covering the operative field. Maximum barrier sterile  technique with sterile gowns and gloves were used for the procedure. A timeout was performed prior to the initiation of the procedure. After creating a small venotomy incision, a micropuncture kit was utilized to access the left internal jugular vein under direct, real-time ultrasound guidance after the overlying soft tissues were anesthetized with 1% lidocaine with epinephrine. Ultrasound image documentation was performed. Despite several attempts, there was difficulty advancing the micro wire centrally. As such, a limited central venogram was performed via the inner 3 French sheath from the micropuncture kit demonstrating patency though marked tortuosity of the central aspect of the left internal jugular vein. Ultimately, a Nitrex wire was able to be advanced centrally. The microwire was kinked to measure appropriate catheter length. The micropuncture sheath was exchanged for a peel-away sheath over a guidewire. Initially, a 5 [place] dual lumen tunneled PICC measuring 28 cm was tunneled in a retrograde fashion from the anterior chest wall to the venotomy incision. Unfortunately, the initial catheter was noted to retract within the tortuous left innominate vein with with tip terminating at the level of the SVC. As such, the existing PICC line was cannulated with a GT Glidewire which was advanced to the level of the IVC. Under intermittent fluoroscopic guidance, the existing 28 cm tunneled PICC line was exchanged for a new slightly longer 31 cm dual lumen tunneled PICC line with tip ultimately terminating within the superior aspect the right atrium. Final catheter positioning was confirmed and documented with a spot radiographic image. The catheter aspirates and flushes normally. The catheter was flushed with appropriate volume heparin dwells. The catheter exit site was secured with  a 0-Prolene retention suture. The venotomy incision was closed with Dermabond and Steri-strips. Dressings were applied. The patient tolerated the procedure well without immediate post procedural complication. FINDINGS: After catheter placement, the tip lies within the superior cavoatrial junction. The catheter aspirates and flushes normally and is ready for immediate use. IMPRESSION: Successful placement of 31 cm dual lumen tunneled PICC catheter via the left internal jugular vein with tip terminating at the superior caval atrial junction. The catheter is ready for immediate use. Electronically Signed   By: Sandi Mariscal M.D.   On: 01/25/2016 13:51   Ir US Guide Vasc Access Left  01/25/2016  INDICATION: Bacteremia due to Staph aureus infection. History of chronic renal insufficiency. Request made for placement of a tunneled PICC line for antibiotic administration. EXAM: TUNNELED PICC LINE WITH ULTRASOUND AND FLUOROSCOPIC GUIDANCE MEDICATIONS: The patient is currently admitted to the hospital and receiving intravenous antibiotics. The antibiotic was given in an appropriate time interval prior to skin puncture. ANESTHESIA/SEDATION: Moderate (conscious) sedation was employed during this procedure. A total of Versed 0.5 mg and Fentanyl 25 mcg was administered intravenously. Moderate Sedation Time: 33 minutes. The patient's level of consciousness and vital signs were monitored continuously by radiology nursing throughout the procedure under my direct supervision. COMPLICATIONS: None immediate. PROCEDURE: Informed written consent was obtained from the patient after a discussion of the risks, benefits, and alternatives to treatment. Questions regarding the procedure were encouraged and answered. Given the presence of the right anterior chest wall pacemaker, the decision was made to place a left internal jugular approach dual lumen PICC line. As such, the left neck and chest were prepped with chlorhexidine in a sterile  fashion, and a sterile drape was applied covering the operative field. Maximum barrier sterile technique with sterile gowns and gloves were used for the procedure. A timeout was performed prior to the initiation of  the procedure. After creating a small venotomy incision, a micropuncture kit was utilized to access the left internal jugular vein under direct, real-time ultrasound guidance after the overlying soft tissues were anesthetized with 1% lidocaine with epinephrine. Ultrasound image documentation was performed. Despite several attempts, there was difficulty advancing the micro wire centrally. As such, a limited central venogram was performed via the inner 3 French sheath from the micropuncture kit demonstrating patency though marked tortuosity of the central aspect of the left internal jugular vein. Ultimately, a Nitrex wire was able to be advanced centrally. The microwire was kinked to measure appropriate catheter length. The micropuncture sheath was exchanged for a peel-away sheath over a guidewire. Initially, a 5 [place] dual lumen tunneled PICC measuring 28 cm was tunneled in a retrograde fashion from the anterior chest wall to the venotomy incision. Unfortunately, the initial catheter was noted to retract within the tortuous left innominate vein with with tip terminating at the level of the SVC. As such, the existing PICC line was cannulated with a GT Glidewire which was advanced to the level of the IVC. Under intermittent fluoroscopic guidance, the existing 28 cm tunneled PICC line was exchanged for a new slightly longer 31 cm dual lumen tunneled PICC line with tip ultimately terminating within the superior aspect the right atrium. Final catheter positioning was confirmed and documented with a spot radiographic image. The catheter aspirates and flushes normally. The catheter was flushed with appropriate volume heparin dwells. The catheter exit site was secured with a 0-Prolene retention suture. The  venotomy incision was closed with Dermabond and Steri-strips. Dressings were applied. The patient tolerated the procedure well without immediate post procedural complication. FINDINGS: After catheter placement, the tip lies within the superior cavoatrial junction. The catheter aspirates and flushes normally and is ready for immediate use. IMPRESSION: Successful placement of 31 cm dual lumen tunneled PICC catheter via the left internal jugular vein with tip terminating at the superior caval atrial junction. The catheter is ready for immediate use. Electronically Signed   By: Sandi Mariscal M.D.   On: 01/25/2016 13:51   Dg Chest Port 1 View  01/28/2016  CLINICAL DATA:  Weakness, infected pacemaker EXAM: PORTABLE CHEST 1 VIEW COMPARISON:  Portable chest x-ray of January 27, 2016 FINDINGS: The left lung is well-expanded and clear. On the right there is mild volume loss with increased density in the upper and lower lobes which is stable. There is no pneumothorax. There is biapical pleural thickening the heart is normal in size. There is dense calcification in the aortic arch. The sternal wires are intact. The right internal jugular venous catheter tip projects over the distal third of the SVC just above the cavoatrial junction. IMPRESSION: 1. Persistent infiltrate in the right upper and lower lobes. Small right pleural effusion. No definite pneumothorax. 2. The left lung is clear.  No CHF.  Aortic atherosclerosis. Electronically Signed   By: David  Martinique M.D.   On: 01/28/2016 07:11   Dg Chest Portable 1 View  01/27/2016  CLINICAL DATA:  Right IJ placement EXAM: PORTABLE CHEST 1 VIEW COMPARISON:  January 16, 2016 FINDINGS: A right IJ has been placed in the interval, probably terminating just within the right side of the atrium, approximately 2 cm inferior to the caval atrial junction. No pneumothorax. Opacity remains in the right mid lung, less focal in the interval. Opacity and effusion remains in the left lung base, also  less focal in the interval. No other interval changes. IMPRESSION: 1. The distal  tip of the new right IJ probably terminates approximately 2 cm below the caval atrial junction, just within the right side of the atrium. Recommend repositioning as clinically warranted. No pneumothorax. 2. The right-sided pulmonary infiltrates are less focal in the interval but remain. There is probably a small amount of layering effusion on the right as well. Electronically Signed   By: Dorise Bullion III M.D   On: 01/27/2016 19:55   Dg Fluoro Guided Needle Plc Aspiration/injection Loc  01/21/2016  CLINICAL DATA:  Left wrist pain and swelling.  Sepsis. EXAM: LEFT WRIST ASPIRATION UNDER FLUOROSCOPY FLUOROSCOPY TIME:  Fluoroscopy Time (in minutes and seconds): 24 SECONDS PROCEDURE: Overlying skin prepped with Betadine, draped in the usual sterile fashion, and infiltrated locally with buffered Lidocaine. 20 gauge needle advanced into the radiocarpal joint under direct fluoroscopic visualization. Approximately 3 cc of bloody fluid was aspirated from the joint and overlying soft tissues. IMPRESSION: Left radiocarpal joint aspiration performed without immediate complication. Mr. Heathman with tolerated the procedure well. Technically successful  hip injection under fluoroscopy. Electronically Signed   By: Lorriane Shire M.D.   On: 01/21/2016 08:10   US Abdomen Limited Ruq  01/17/2016  CLINICAL DATA:  80 year old male with abnormal LFTs. Prior cholecystectomy. Pneumonia. Initial encounter. EXAM: US ABDOMEN LIMITED - RIGHT UPPER QUADRANT COMPARISON:  Noncontrast chest CT 01/16/2016. CT Abdomen and Pelvis 08/30/2015 and earlier FINDINGS: Gallbladder: Surgically absent Common bile duct: Diameter: 6 mm, normal Liver: Chronic 2.6 cm left hepatic lobe cyst (image 18) does demonstrate mild wall thickening, however, this lesion is stable on multiple prior CTs back to 2011. Similar 3.1 cm right lobe cyst, also not significantly changed since  2011. Underlying hepatic echotexture is within normal limits. No intrahepatic biliary ductal dilatation is evident. No new liver lesion identified. Other findings: Negative visible right kidney. No free fluid identified. IMPRESSION: No acute liver or biliary findings. 2-3 cm liver cysts which are mildly thick walled but benign, having not significantly changed since 2011. Electronically Signed   By: Genevie Ann M.D.   On: 01/17/2016 07:56    Anti-Infectives:   Azithromycin 01/16/16---> 01/17/16 Rocephin 01/16/16---> 01/17/16 Ancef 01/17/16---> Rifampin 01/17/16 --->  Subjective:   [name] D Villarin Sr. continues to feel weak and dizzy.    Objective:    Filed Vitals:   01/30/16 1238 01/30/16 1300 01/30/16 1400 01/30/16 1430  BP: 150/76 152/73 143/72 159/88  Pulse: 86 85 85 91  Temp: 98.4 F (36.9 C)     TempSrc: Oral     Resp: _0 Height: _1  (1.778 m)     Weight: 84.5 kg (186 lb 4.6 oz)     SpO2: 99% 100% 100% 100%    Intake/Output Summary (Last 24 hours) at 01/30/16 1703 Last data filed at 01/30/16 1441  Gross per 24 hour  Intake 1583.33 ml  Output   1830 ml  Net -246.67 ml   Filed Weights   01/26/16 0628 01/28/16 0700 01/30/16 1238  Weight: 72.394 kg (159 lb 9.6 oz) 76.658 kg (169 lb) 84.5 kg (186 lb 4.6 oz)    Exam: General exam: Weak,Laying in bed Respiratory system: Clear to auscultation. Respiratory effort normal. Cardiovascular system: HSIR/tachy. No JVD,  rubs, gallops or clicks. Grade 2/6 SEM. Gastrointestinal system: Abdomen is nondistended, soft and nontender. No organomegaly or masses felt. Normal bowel sounds heard. Central nervous system: Mildly lethargic. No focal neurological deficits. Extremities: Left hand markedly erythematous/swollen and tender with limited range of motion of fingers.  Skin: No rashes, lesions or ulcers Psychiatry: Judgement and insight appear normal. Mood & affect appropriate.   Data Reviewed:   I have personally reviewed following  labs and imaging studies:  Labs: Basic Metabolic Panel:  Recent Labs Lab 01/26/16 0513 01/27/16 0910 01/27/16 1854 01/27/16 2019 01/28/16 0536 01/29/16 0440 01/30/16 0400  NA 131* 133* 134* 136 135 135  136 136  K 4.5 4.2 4.4 4.5 4.1 4.2  4.2 3.8  CL 102 106  --   --  109 112*  112* 112*  CO2 20* 18*  --   --  16* 16*  16* 15*  GLUCOSE 197* 179*  --   --  126* 116*  117* 105*  BUN 123* 126*  --   --  115* 117*  117* 109*  CREATININE 5.71* 5.92*  --   --  5.53* 5.46*  5.45* 4.96*  CALCIUM 8.2* 8.7*  --   --  8.2* 8.3*  8.3* 8.2*  PHOS 4.1 4.7*  --   --  5.3* 5.2* 4.7*   GFR Estimated Creatinine Clearance: 12.7 mL/min (by C-G formula based on Cr of 4.96).  CBC:  Recent Labs Lab 01/26/16 0513 01/27/16 0910 01/27/16 1854 01/27/16 2019 01/28/16 0008 01/28/16 1245 01/29/16 0440  WBC 8.6 14.1*  --   --  13.3* 11.3* 10.1  HGB 7.0* 9.1* 6.8* 8.5* 8.8* 7.3* 8.0*  HCT 20.5* 27.0* 20.0* 25.0* 26.1* 21.9* 24.6*  MCV 93.2 92.5  --   --  91.9 94.0 94.6  PLT 251 307  --   --  264 215 264   CBG:  Recent Labs Lab 01/29/16 1207 01/29/16 1645 01/29/16 2013 01/30/16 0808 01/30/16 1148  GLUCAP 197* 204* 238* 112* 194*   Microbiology Recent Results (from the past 240 hour(s))  Aerobic/Anaerobic Culture (surgical/deep wound)     Status: None   Collection Time: 01/21/16  6:52 PM  Result Value Ref Range Status   Specimen Description WOUND LEFT WRIST  Final   Special Requests NONE  Final   Gram Stain   Final    WBC PRESENT,BOTH PMN AND MONONUCLEAR NO ORGANISMS SEEN    Culture No growth aerobically or anaerobically.  Final   Report Status 01/26/2016 FINAL  Final  Surgical pcr screen     Status: Abnormal   Collection Time: 01/27/16  1:12 PM  Result Value Ref Range Status   MRSA, PCR NEGATIVE NEGATIVE Final   Staphylococcus aureus POSITIVE (A) NEGATIVE Final    Comment:        The Xpert SA Assay (FDA approved for NASAL specimens in patients over 21 years of  age), is one component of a comprehensive surveillance program.  Test performance has been validated by [facility] for patients greater than or equal to 59 year old. It is not intended to diagnose infection nor to guide or monitor treatment.     Radiology: No results found.  Medications:   . sodium chloride   Intravenous Once  . amiodarone  100 mg Oral Daily  . atorvastatin  80 mg Oral QHS  . bisacodyl  10 mg Rectal BID  . Calcifediol ER  30 mcg Oral QHS  . carvedilol  6.25 mg Oral BID WC  .  ceFAZolin (ANCEF) IV  1 g Intravenous Q24H  . Chlorhexidine Gluconate Cloth  6 each Topical Daily  . insulin aspart  0-5 Units Subcutaneous QHS  . insulin aspart  0-9 Units Subcutaneous TID WC  . insulin aspart  6 Units  Subcutaneous TID WC  . insulin glargine  5 Units Subcutaneous Daily  . isosorbide mononitrate  15 mg Oral Daily  . multivitamin with minerals  1 tablet Oral q morning - 10a  . mupirocin ointment  1 application Nasal BID  . polyethylene glycol  34 g Oral BID   Followed by  . [START ON 02/01/2016] polyethylene glycol  17 g Oral Daily  . [START ON 01/31/2016] predniSONE  20 mg Oral Q breakfast  . rifampin  600 mg Oral Daily  . senna-docusate  2 tablet Oral BID  . tamsulosin  0.4 mg Oral QPC supper   Continuous Infusions: . norepinephrine (LEVOPHED) Adult infusion Stopped (01/28/16 1815)  .  sodium bicarbonate 150 mEq in sterile water 1000 mL infusion 150 mEq (01/30/16 1120)  . sodium chloride irrigation         LOS: 14 days   Keah Lamba MD [facility] Pager [phone].    *Please refer to [email or web address], password [email or web address] to get updated schedule on who will round on this patient, as hospitalists switch teams weekly. If 7PM-7AM, please contact night-coverage at [email or web address], password [email or web address] for any overnight needs.  01/30/2016, 5:03 PM

## 2016-01-30 NOTE — Progress Notes (Addendum)
Physical Therapy Treatment Patient Details Name: [name]. 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 Has CAP, hematuria, L wrist and ankle pain with gout history. 7/12 I&D Lt wrist with pseudogout diagnosed; 7/20 pacemaker removed and Rt groin/femoral artery opened to retrieve pacemaker lead PMHx: SSS, pacemaker, CKD, PAF   PT Comments    Patient pre-medicated for pain. Patient agreeable to attempting bed exercises and then progression to mobility. Once EOB, he felt need to get on [facility] for BM. While on BSC, pt HR elevated to 130 bpm (pt did not appear to be straining). Pt then began to complain of dizziness with skin feeling cool and clammy. Automatic [id number] (?)  RN called and in to assist with transfer off BSC (assisted to stand, BSC removed and bed pulled up under pt; pt assisted to supine). BP in supine 150s/80s and mentating clearly.   Follow Up Recommendations  SNF     Equipment Recommendations  Other (comment) (TBD at next venue of care)    Recommendations for Other Services       Precautions / Restrictions Precautions Precautions: Fall Precaution Comments: septic arthritis Lt wrist, gout flare up Rt ankle, Lt toes Required Braces or Orthoses: Other Brace/Splint Other Brace/Splint: wrist splint for comfort only Restrictions Weight Bearing Restrictions: Yes LUE Weight Bearing: Weight bearing as tolerated    Mobility  Bed Mobility Overal bed mobility: Needs Assistance Bed Mobility: Supine to Sit;Sit to Supine     Supine to sit: HOB elevated;Mod assist Sit to supine: Max assist;+2 for physical assistance;+2 for safety/equipment (due to near syncope)   General bed mobility comments: Patient moved legs over EOB and use RUE to pull to sit with assist to torso; returned to supine quickly    Transfers Overall transfer level: Needs assistance Equipment used: Left platform walker;None Transfers: Stand Pivot Transfers Sit to Stand: +2 physical assistance;Mod assist (heavy mod assist) Stand pivot transfers: +2 physical assistance;+2 safety/equipment;Mod assist       General transfer comment: to [facility] (pt request) with stand-pivot no device; pt with difficulty advancing his feet; from [facility] stood with Lt PFRW, commode removed and bed brought under patient due to lightheadedness and incr HR  Ambulation/Gait             General Gait Details: unable due to dizziness   Stairs            Wheelchair Mobility    Modified Rankin (Stroke Patients Only)       Balance   Sitting-balance support: No upper extremity supported;Feet supported Sitting balance-Leahy Scale: Fair     Standing balance support: Bilateral upper extremity supported Standing balance-Leahy Scale: Zero Standing balance comment: Relies on support from Lt plaform RW and physical assist of 2                    Cognition Arousal/Alertness: Awake/alert Behavior During Therapy: WFL for tasks assessed/performed Overall Cognitive Status: Within Functional Limits for tasks assessed                      Exercises General Exercises - Upper Extremity Shoulder Flexion: AAROM;Left;5 reps (to 45 only due to wrist pain) Elbow Flexion: AAROM;Left;5 reps General Exercises - Lower Extremity Ankle Circles/Pumps: AROM;Both;10 reps Heel Slides: AAROM;Both;Other reps (comment) (3)    General Comments  Pertinent Vitals/Pain Pain Assessment: Faces Faces Pain Scale: Hurts even more Pain Location: Lt wrist>Left foot>Rt foot/ankle Pain Descriptors / Indicators: Discomfort;Grimacing Pain Intervention(s): Premedicated before session;Limited activity within patient's tolerance;Repositioned    Home Living                      Prior Function            PT Goals (current goals  can now be found in the care plan section) Acute Rehab PT Goals Patient Stated Goal: get moving Time For Goal Achievement: 02/04/16 Potential to Achieve Goals: Good Progress towards PT goals: Not progressing toward goals - comment (near syncope)    Frequency  Min 2X/week    PT Plan Current plan remains appropriate    Co-evaluation             End of Session Equipment Utilized During Treatment: Gait belt Activity Tolerance: Patient limited by pain;Treatment limited secondary to medical complications (Comment) (near syncope) Patient left: with call bell/phone within reach;in bed;with bed alarm set     Time: [id number] PT Time Calculation (min) (ACUTE ONLY): 44 min  Charges:  $Therapeutic Exercise: 8-22 mins $Therapeutic Activity: 23-37 mins                    G Codes:      Jayvian Escoe 02-13-16, 8:19 PM Pager [phone]

## 2016-01-31 LAB — RENAL FUNCTION PANEL
ALBUMIN: 2 g/dL — AB (ref 3.5–5.0)
ANION GAP: 7 (ref 5–15)
BUN: 101 mg/dL — ABNORMAL HIGH (ref 6–20)
CALCIUM: 8.4 mg/dL — AB (ref 8.9–10.3)
CO2: 18 mmol/L — ABNORMAL LOW (ref 22–32)
CREATININE: 4.51 mg/dL — AB (ref 0.61–1.24)
Chloride: 111 mmol/L (ref 101–111)
GFR calc non Af Amer: 11 mL/min — ABNORMAL LOW (ref >60)
GFR, EST AFRICAN AMERICAN: 13 mL/min — AB (ref >60)
Glucose, Bld: 115 mg/dL — ABNORMAL HIGH (ref 65–99)
PHOSPHORUS: 4.5 mg/dL (ref 2.5–4.6)
Potassium: 3.6 mmol/L (ref 3.5–5.1)
SODIUM: 136 mmol/L (ref 135–145)

## 2016-01-31 LAB — CBC
HCT: 23.5 % — ABNORMAL LOW (ref 39.0–52.0)
HEMOGLOBIN: 7.7 g/dL — AB (ref 13.0–17.0)
MCH: 30.7 pg (ref 26.0–34.0)
MCHC: 32.8 g/dL (ref 30.0–36.0)
MCV: 93.6 fL (ref 78.0–100.0)
PLATELETS: 239 10*3/uL (ref 150–400)
RBC: 2.51 MIL/uL — AB (ref 4.22–5.81)
RDW: 17.9 % — ABNORMAL HIGH (ref 11.5–15.5)
WBC: 10.1 10*3/uL (ref 4.0–10.5)

## 2016-01-31 LAB — GLUCOSE, CAPILLARY
Glucose-Capillary: 133 mg/dL — ABNORMAL HIGH (ref 65–99)
Glucose-Capillary: 158 mg/dL — ABNORMAL HIGH (ref 65–99)
Glucose-Capillary: 95 mg/dL (ref 65–99)
Glucose-Capillary: 108 mg/dL — ABNORMAL HIGH (ref 65–99)

## 2016-01-31 LAB — PREPARE RBC (CROSSMATCH)

## 2016-01-31 MED ORDER — SODIUM CHLORIDE 0.9 % IV SOLN
Freq: Once | INTRAVENOUS | Status: AC
  Administered 2016-01-31: 08:00:00 via INTRAVENOUS

## 2016-01-31 MED ORDER — CARVEDILOL 25 MG PO TABS
25.0000 mg | ORAL_TABLET | Freq: Two times a day (BID) | ORAL | Status: DC
  Administered 2016-01-31 – 2016-02-03 (×6): 25 mg via ORAL
  Filled 2016-01-31 (×6): qty 1

## 2016-01-31 MED ORDER — ISOSORBIDE MONONITRATE ER 30 MG PO TB24
60.0000 mg | ORAL_TABLET | Freq: Every day | ORAL | Status: DC
  Administered 2016-02-01: 60 mg via ORAL
  Filled 2016-01-31: qty 2

## 2016-01-31 NOTE — Progress Notes (Signed)
Progress Note    [name]  JXB:[id number] DOB: 1935-07-20  DOA: 01/16/2016 PCP: [name], MD    Brief Narrative:   [name] Zwart. is an 80 y.o. male with a PMH of paroxysmal atrial fibrillation/pacemaker not on anticoagulation secondary to history of GI bleed, CAD, stage IV CKD, CAD, hypertension,Liddle's syndrome, myelodysplastic syndrome, PVD and hyperlipidemia who was admitted 01/16/16 with multifocal pneumonia complicated by coagulopathy and hematuria. Blood cultures were positive for MSSA. Patient subsequently developed severe wrist pain and underwent arthrotomy of left wrist joint 01/21/16. Pseudogout suspected given negative synovial fluid cultures. Echocardiogram done which showed echodensity on RA lead, s/p PPM system extraction 7/18 by Dr. Lovena Le (Required vascular surgery to retrieve RV lead tip from RFV)  Assessment/Plan:   Staphylococcus aureus bacteremia with Evidence of vegetation on on atrial pacing lead pacemaker , and  right lower lobe pneumonia/Multifocal pneumonia - Blood culture growing MSSA on 01/16/2016, blood cultures 01/17/16 with no growth to date - Antibiotic management per ID, currently on rifampin and Ancef to finish total of 4 weeks through 02/12/2016 - s/p PPM system extraction 7/18 by Dr. Lovena Le (Required vascular surgery to retrieve RV lead tip from RFV), Per ID recs its safe to replace a new pacemaker 14 days after the first negative blood culture But currently no plan by  EP to replace his pacemaker. - TEE done 01/26/16 with no evidence of vegetation,  - Palliative care consultation requested given overall failure to thrive, poor prognosis with progressive renal failure (does not want to consider HD and would likely not be a good candidate.  Hypotension/history of hypertension - patient is hypotensive 7/19, PCCM consulted, Requiring brief pressor support,  -  Initially holding  antihypertensive medication include Coreg, Imdur, hydralazine  and clonidine, blood pressure continues to increase, we'll will increase Coreg to baseline dose, will increase Imdur today from 15-60,   continue to hold hydralazine and clonidine.  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 5 cm masslike area consistent with a blood clot.  - Evaluated by urology with a cystoscopy performed, status post bladder clot evacuation and bladder irrigation. Management per urology, initially on CBI , remains with significant hematuria, will start on flushes every 4 hours.  Anemia - Multifactorial, anemia of chronic kidney disease, and chronic blood loss in the setting of hematuria, required multiple blood transfusions, will transfuse 1 unit PRBC as his hemoglobin of 7.7.  Coronary artery disease/chronic diastolic CHF  - Status post CABG. Aspirin/Plavix currently on hold secondary to hematuria and hemoptysis.  - Continue Lipitor,  - Resume Imdur and Coreg giving improving hypotension - No evidence of heart failure exacerbation. Diuretics currently on hold.  PAF (paroxysmal atrial fibrillation) (HCC) status post pacemaker for sick sinus syndrome - CHA2DS2Vasc is at least 4, but not felt to be a candidate for anticoagulation given history of GI bleeding. Continue amiodarone.  Acute renal failure superimposed on stage IV chronic kidney disease/renal artery stenosis - Baseline creatinine 3.8. Creatinine rising. Differential remains broad with ATN, interstitial nephritis, postinfectious GN or progressive CKD all possible.  - Continue management per nephrology.  Wrist swelling secondary to probable pseudogout - Seen by orthopedics, status post incision and drainage 01/21/16. Sinoatrial Fluid cultures negative ,CPPD crystals noted, findings most consistent with pseudogout. Will follow-up with Dr. Grandville Silos. On prednisone, will start on taper.  Liddle's syndrome - Monitor for hypokalemia and high blood  pressure.  Myelodysplastic syndrome/anemia of chronic disease -  Gets Epogen injections every 2 weeks at the cancer center. Hemoglobin trending down. Continue to monitor. Hemoglobin is 7 today, given 1 unit of PRBCs 01/26/16. Resume Epogen. As we'll transfused 1 unit PRBC  Diabetes mellitus with peripheral artery disease - Usually diet-controlled at home, continue with insulin sliding scale,  and 6 units NovoLog before meals , will start on low-dose Lantus.  Gastroparesis  Prostate cancer Gets Lupron injections every 4 months. S/P external beam radiation.   Family Communication/Anticipated D/C date and plan/Code Status   DVT prophylaxis: SCDs ordered. Code Status: DNR Family CommunicationDiscussedth patient, none at bedside Disposition Plan:  likely several more days given current sepsis/pacemaker endocarditis, palliative meeting today with patient and wife regarding goals of care.   Medical Consultants:    Cardiology  Orthopedic Surgery  Infectious Disease  Nephrology  Urology  PCCM  Palliative    Procedures:   - 1 unit PRBC transfusion on 7/17, and 1 unit on 7/18, and 1 unit on 7/22 - s/p PPM system extraction 7/18 by Dr. Lovena Le (Required vascular surgery to retrieve RV lead tip from RFV) - Right IJ TLC on 7/18   2 D Echo 01/24/16:  Dynamic obstruction with peak velocity of 351 cm/sec. no regional wall motion abnormalities. Grade 1 diastolic dysfunction. Mobile echodensity in the right atrium attached to pacer wire. Mild systolic pulmonary artery hypertension.  Dg Chest 2 View  01/16/2016  CLINICAL DATA:  Chest pain, cough for 2 days EXAM: CHEST  2 VIEW COMPARISON:  CT chest 04/03/2014 FINDINGS: Cardiomediastinal silhouette is unremarkable. Status post median sternotomy. Dual lead cardiac pacemaker with leads in right atrium and right ventricle. There is streaky airspace opacification in right base infrahilar region and right upper lobe peripheral perihilar. Findings  highly suspicious for pneumonia. Probable small right pleural effusion. Left lung is clear. IMPRESSION: There is streaky airspace opacification in right base infrahilar region and right upper lobe peripheral perihilar. Findings highly suspicious for pneumonia. Followup PA and lateral chest X-ray is recommended in 3-4 weeks following trial of antibiotic therapy to ensure resolution and exclude underlying malignancy. Probable small right pleural effusion. Electronically Signed   By: Lahoma Crocker LarryD.   On: 01/16/2016 13:58   Dg Wrist 2 Views Left  01/18/2016  CLINICAL DATA:  Acute onset of left wrist pain.  Initial encounter. EXAM: LEFT WRIST - 2 VIEW COMPARISON:  None. FINDINGS: There is no evidence of fracture or dislocation. There is joint space narrowing along the radiocarpal joint, and at the radial aspect of the carpal rows. This resolves in mild chronic deformity of the scaphoid and lunate, with expansion of the scapholunate distance to 5 mm. Scattered vascular calcifications are seen. No additional soft tissue abnormalities are characterized on radiograph. IMPRESSION: 1. No evidence of fracture or dislocation. 2. Degenerative joint space narrowing along the radiocarpal joints, and at the radial aspect of the carpal rows. This results in mild chronic deformity of the scaphoid and lunate, with expansion of the scapholunate distance to 5 mm, reflecting scapholunate dissociation. Electronically Signed   By: Garald Balding LarryD.   On: 01/18/2016 16:57   Ct Chest Wo Contrast  01/16/2016  CLINICAL DATA:  Hemoptysis, cough.  Symptoms since Monday. EXAM: CT CHEST WITHOUT CONTRAST TECHNIQUE: TECHNIQUE Multidetector CT imaging of the chest was performed following the standard protocol without IV contrast. COMPARISON:  Chest radiograph 01/16/2016, CT 04/03/2014 FINDINGS: Cardiovascular: Calcification of the thoracic aorta. Coronary bypass graft. Pacer wires in the RIGHT heart. No pericardial fluid. Mediastinum/Nodes: No  axillary or supraclavicular adenopathy. No mediastinal hilar adenopathy. Esophagus normal. Lungs/Pleura: There is consolidative airspace disease in the RIGHT lower lobe with air bronchograms. A similar pattern in the posterior aspect the RIGHT upper lobe. Smaller pattern of consolidation in the LEFT lower lobe. Findings are consistent with multifocal pneumonia. Upper Abdomen: Limited view of the liver, kidneys, pancreas are unremarkable. Normal adrenal glands. Musculoskeletal: No aggressive osseous lesion.  Midline sternotomy. IMPRESSION: 1. Multifocal pneumonia with most dense infection in RIGHT lower lobe. 2. Coronary artery calcification and aortic atherosclerotic calcification. Electronically Signed   By: Suzy Bouchard LarryD.   On: 01/16/2016 16:32   US Renal  01/17/2016  CLINICAL DATA:  80 year old male with microscopic hematuria. Personal history of prostate cancer. Initial encounter. EXAM: RENAL / URINARY TRACT ULTRASOUND COMPLETE COMPARISON:  CT Abdomen and Pelvis 08/30/2015, and earlier. Right upper quadrant ultrasound from today reported separately. FINDINGS: Right Kidney: Length: 12.3 cm. Echogenicity within normal limits. No mass or hydronephrosis visualized. Left Kidney: Length: 12.0 cm. Echogenicity within normal limits. No mass or hydronephrosis visualized. Bladder: 5 cm area which is dependent, complex, and mass like (image 15), however, there is no internal vascularity detected with brief color Doppler interrogation. Elsewhere the urinary bladder appears normal. Prostate measured to be 5.2 x 3.1 x 4.3 cm. IMPRESSION: 1. Abnormal 5 cm complex, mass-like area of dependent echogenicity in the urinary bladder. However, lack of internal vascularity on Doppler suggests this is more likely blood clot rather than a bladder tumor. Further evaluation recommended. 2. Normal for age sonographic appearance of both kidneys. Electronically Signed   By: Genevie Ann LarryD.   On: 01/17/2016 08:01   US  Renal  01/09/2016  CLINICAL DATA:  Two weeks of hematuria ; history of prostate enlargement EXAM: RENAL / URINARY TRACT ULTRASOUND COMPLETE COMPARISON:  Abdominal pelvic CT scan of July 30, 2015 FINDINGS: Right Kidney: Length: 11.5 cm. The renal cortical echotexture is equal to or slightly greater than that of the adjacent liver. There is no hydronephrosis nor discrete mass. Left Kidney: Length: 10.3 cm. The renal cortical echotexture is mildly increased similar to that on the right. There is no hydronephrosis. Bladder: There is an irregular mass in the posterior aspect of the urinary bladder wall measuring 2.5 x 2 x 4.4 cm. This appears separate from the more anteriorly and inferiorly positioned prostate gland. The prostate gland itself does produce a mild impression upon the urinary bladder base. IMPRESSION: 1. Posterior bladder wall mass worrisome for malignancy. This is separate from the mildly enlarged prostate gland. 2. Increased renal cortical echotexture bilaterally consistent with medical renal disease. There is no hydronephrosis. Electronically Signed   By: David  Martinique LarryD.   On: 01/09/2016 14:44   Ir Fluoro Guide Cv Line Left  01/25/2016  INDICATION: Bacteremia due to Staph aureus infection. History of chronic renal insufficiency. Request made for placement of a tunneled PICC line for antibiotic administration. EXAM: TUNNELED PICC LINE WITH ULTRASOUND AND FLUOROSCOPIC GUIDANCE MEDICATIONS: The patient is currently admitted to the hospital and receiving intravenous antibiotics. The antibiotic was given in an appropriate time interval prior to skin puncture. ANESTHESIA/SEDATION: Moderate (conscious) sedation was employed during this procedure. A total of Versed 0.5 mg and Fentanyl 25 mcg was administered intravenously. Moderate Sedation Time: 33 minutes. The patient's level of consciousness and vital signs were monitored continuously by radiology nursing throughout the procedure under my direct  supervision. COMPLICATIONS: None immediate. PROCEDURE: Informed written consent was obtained from the patient after a discussion of  the risks, benefits, and alternatives to treatment. Questions regarding the procedure were encouraged and answered. Given the presence of the right anterior chest wall pacemaker, the decision was made to place a left internal jugular approach dual lumen PICC line. As such, the left neck and chest were prepped with chlorhexidine in a sterile fashion, and a sterile drape was applied covering the operative field. Maximum barrier sterile technique with sterile gowns and gloves were used for the procedure. A timeout was performed prior to the initiation of the procedure. After creating a small venotomy incision, a micropuncture kit was utilized to access the left internal jugular vein under direct, real-time ultrasound guidance after the overlying soft tissues were anesthetized with 1% lidocaine with epinephrine. Ultrasound image documentation was performed. Despite several attempts, there was difficulty advancing the micro wire centrally. As such, a limited central venogram was performed via the inner 3 French sheath from the micropuncture kit demonstrating patency though marked tortuosity of the central aspect of the left internal jugular vein. Ultimately, a Nitrex wire was able to be advanced centrally. The microwire was kinked to measure appropriate catheter length. The micropuncture sheath was exchanged for a peel-away sheath over a guidewire. Initially, a 5 [place] dual lumen tunneled PICC measuring 28 cm was tunneled in a retrograde fashion from the anterior chest wall to the venotomy incision. Unfortunately, the initial catheter was noted to retract within the tortuous left innominate vein with with tip terminating at the level of the SVC. As such, the existing PICC line was cannulated with a GT Glidewire which was advanced to the level of the IVC. Under intermittent fluoroscopic  guidance, the existing 28 cm tunneled PICC line was exchanged for a new slightly longer 31 cm dual lumen tunneled PICC line with tip ultimately terminating within the superior aspect the right atrium. Final catheter positioning was confirmed and documented with a spot radiographic image. The catheter aspirates and flushes normally. The catheter was flushed with appropriate volume heparin dwells. The catheter exit site was secured with a 0-Prolene retention suture. The venotomy incision was closed with Dermabond and Steri-strips. Dressings were applied. The patient tolerated the procedure well without immediate post procedural complication. FINDINGS: After catheter placement, the tip lies within the superior cavoatrial junction. The catheter aspirates and flushes normally and is ready for immediate use. IMPRESSION: Successful placement of 31 cm dual lumen tunneled PICC catheter via the left internal jugular vein with tip terminating at the superior caval atrial junction. The catheter is ready for immediate use. Electronically Signed   By: Sandi Mariscal LarryD.   On: 01/25/2016 13:51   Ir US Guide Vasc Access Left  01/25/2016  INDICATION: Bacteremia due to Staph aureus infection. History of chronic renal insufficiency. Request made for placement of a tunneled PICC line for antibiotic administration. EXAM: TUNNELED PICC LINE WITH ULTRASOUND AND FLUOROSCOPIC GUIDANCE MEDICATIONS: The patient is currently admitted to the hospital and receiving intravenous antibiotics. The antibiotic was given in an appropriate time interval prior to skin puncture. ANESTHESIA/SEDATION: Moderate (conscious) sedation was employed during this procedure. A total of Versed 0.5 mg and Fentanyl 25 mcg was administered intravenously. Moderate Sedation Time: 33 minutes. The patient's level of consciousness and vital signs were monitored continuously by radiology nursing throughout the procedure under my direct supervision. COMPLICATIONS: None  immediate. PROCEDURE: Informed written consent was obtained from the patient after a discussion of the risks, benefits, and alternatives to treatment. Questions regarding the procedure were encouraged and answered. Given the presence of the  right anterior chest wall pacemaker, the decision was made to place a left internal jugular approach dual lumen PICC line. As such, the left neck and chest were prepped with chlorhexidine in a sterile fashion, and a sterile drape was applied covering the operative field. Maximum barrier sterile technique with sterile gowns and gloves were used for the procedure. A timeout was performed prior to the initiation of the procedure. After creating a small venotomy incision, a micropuncture kit was utilized to access the left internal jugular vein under direct, real-time ultrasound guidance after the overlying soft tissues were anesthetized with 1% lidocaine with epinephrine. Ultrasound image documentation was performed. Despite several attempts, there was difficulty advancing the micro wire centrally. As such, a limited central venogram was performed via the inner 3 French sheath from the micropuncture kit demonstrating patency though marked tortuosity of the central aspect of the left internal jugular vein. Ultimately, a Nitrex wire was able to be advanced centrally. The microwire was kinked to measure appropriate catheter length. The micropuncture sheath was exchanged for a peel-away sheath over a guidewire. Initially, a 5 [place] dual lumen tunneled PICC measuring 28 cm was tunneled in a retrograde fashion from the anterior chest wall to the venotomy incision. Unfortunately, the initial catheter was noted to retract within the tortuous left innominate vein with with tip terminating at the level of the SVC. As such, the existing PICC line was cannulated with a GT Glidewire which was advanced to the level of the IVC. Under intermittent fluoroscopic guidance, the existing 28 cm tunneled  PICC line was exchanged for a new slightly longer 31 cm dual lumen tunneled PICC line with tip ultimately terminating within the superior aspect the right atrium. Final catheter positioning was confirmed and documented with a spot radiographic image. The catheter aspirates and flushes normally. The catheter was flushed with appropriate volume heparin dwells. The catheter exit site was secured with a 0-Prolene retention suture. The venotomy incision was closed with Dermabond and Steri-strips. Dressings were applied. The patient tolerated the procedure well without immediate post procedural complication. FINDINGS: After catheter placement, the tip lies within the superior cavoatrial junction. The catheter aspirates and flushes normally and is ready for immediate use. IMPRESSION: Successful placement of 31 cm dual lumen tunneled PICC catheter via the left internal jugular vein with tip terminating at the superior caval atrial junction. The catheter is ready for immediate use. Electronically Signed   By: Sandi Mariscal LarryD.   On: 01/25/2016 13:51   Dg Chest Port 1 View  01/28/2016  CLINICAL DATA:  Weakness, infected pacemaker EXAM: PORTABLE CHEST 1 VIEW COMPARISON:  Portable chest x-ray of January 27, 2016 FINDINGS: The left lung is well-expanded and clear. On the right there is mild volume loss with increased density in the upper and lower lobes which is stable. There is no pneumothorax. There is biapical pleural thickening the heart is normal in size. There is dense calcification in the aortic arch. The sternal wires are intact. The right internal jugular venous catheter tip projects over the distal third of the SVC just above the cavoatrial junction. IMPRESSION: 1. Persistent infiltrate in the right upper and lower lobes. Small right pleural effusion. No definite pneumothorax. 2. The left lung is clear.  No CHF.  Aortic atherosclerosis. Electronically Signed   By: David  Martinique LarryD.   On: 01/28/2016 07:11   Dg Chest  Portable 1 View  01/27/2016  CLINICAL DATA:  Right IJ placement EXAM: PORTABLE CHEST 1 VIEW COMPARISON:  January 16, 2016 FINDINGS: A right IJ has been placed in the interval, probably terminating just within the right side of the atrium, approximately 2 cm inferior to the caval atrial junction. No pneumothorax. Opacity remains in the right mid lung, less focal in the interval. Opacity and effusion remains in the left lung base, also less focal in the interval. No other interval changes. IMPRESSION: 1. The distal tip of the new right IJ probably terminates approximately 2 cm below the caval atrial junction, just within the right side of the atrium. Recommend repositioning as clinically warranted. No pneumothorax. 2. The right-sided pulmonary infiltrates are less focal in the interval but remain. There is probably a small amount of layering effusion on the right as well. Electronically Signed   By: Dorise Bullion III LarryD   On: 01/27/2016 19:55   Dg Fluoro Guided Needle Plc Aspiration/injection Loc  01/21/2016  CLINICAL DATA:  Left wrist pain and swelling.  Sepsis. EXAM: LEFT WRIST ASPIRATION UNDER FLUOROSCOPY FLUOROSCOPY TIME:  Fluoroscopy Time (in minutes and seconds): 24 SECONDS PROCEDURE: Overlying skin prepped with Betadine, draped in the usual sterile fashion, and infiltrated locally with buffered Lidocaine. 20 gauge needle advanced into the radiocarpal joint under direct fluoroscopic visualization. Approximately 3 cc of bloody fluid was aspirated from the joint and overlying soft tissues. IMPRESSION: Left radiocarpal joint aspiration performed without immediate complication. Mr. [name] with tolerated the procedure well. Technically successful  hip injection under fluoroscopy. Electronically Signed   By: Lorriane Shire LarryD.   On: 01/21/2016 08:10   US Abdomen Limited Ruq  01/17/2016  CLINICAL DATA:  80 year old male with abnormal LFTs. Prior cholecystectomy. Pneumonia. Initial encounter. EXAM: US ABDOMEN  LIMITED - RIGHT UPPER QUADRANT COMPARISON:  Noncontrast chest CT 01/16/2016. CT Abdomen and Pelvis 08/30/2015 and earlier FINDINGS: Gallbladder: Surgically absent Common bile duct: Diameter: 6 mm, normal Liver: Chronic 2.6 cm left hepatic lobe cyst (image 18) does demonstrate mild wall thickening, however, this lesion is stable on multiple prior CTs back to 2011. Similar 3.1 cm right lobe cyst, also not significantly changed since 2011. Underlying hepatic echotexture is within normal limits. No intrahepatic biliary ductal dilatation is evident. No new liver lesion identified. Other findings: Negative visible right kidney. No free fluid identified. IMPRESSION: No acute liver or biliary findings. 2-3 cm liver cysts which are mildly thick walled but benign, having not significantly changed since 2011. Electronically Signed   By: Genevie Ann LarryD.   On: 01/17/2016 07:56    Anti-Infectives:   Azithromycin 01/16/16---> 01/17/16 Rocephin 01/16/16---> 01/17/16 Ancef 01/17/16---> Rifampin 01/17/16 --->  Subjective:   [name] D Sahagun Sr. continues to feel weak , Otherwise denies any complaints.    Objective:    Filed Vitals:   01/31/16 1000 01/31/16 1015 01/31/16 1030 01/31/16 1200  BP: 153/72  164/75 163/90  Pulse: 102 103 96 88  Temp: 98.9 F (37.2 C)   98.4 F (36.9 C)  TempSrc: Oral   Oral  Resp: _0 Height:      Weight:      SpO2: 100% 100% 99% 100%    Intake/Output Summary (Last 24 hours) at 01/31/16 1358 Last data filed at 01/31/16 1200  Gross per 24 hour  Intake 1628.33 ml  Output   1925 ml  Net -296.67 ml   Filed Weights   01/26/16 0628 01/28/16 0700 01/30/16 1238  Weight: 72.394 kg (159 lb 9.6 oz) 76.658 kg (169 lb) 84.5 kg (186 lb 4.6 oz)    Exam:  General exam: Weak,Laying in bed Respiratory system: Clear to auscultation. Respiratory effort normal. Cardiovascular system: HSIR/tachy. No JVD,  rubs, gallops or clicks. Grade 2/6 SEM. Gastrointestinal system: Abdomen is  nondistended, soft and nontender. No organomegaly or masses felt. Normal bowel sounds heard. Central nervous system: Mildly lethargic. No focal neurological deficits. Extremities: Left hand markedly erythematous/swollen and tender with limited range of motion of fingers. Skin: No rashes, lesions or ulcers Psychiatry: Judgement and insight appear normal. Mood & affect appropriate.   Data Reviewed:   I have personally reviewed following labs and imaging studies:  Labs: Basic Metabolic Panel:  Recent Labs Lab 01/27/16 0910  01/27/16 2019 01/28/16 0536 01/29/16 0440 01/30/16 0400 01/31/16 0344  NA 133*  < > 136 135 135  136 136 136  K 4.2  < > 4.5 4.1 4.2  4.2 3.8 3.6  CL 106  --   --  109 112*  112* 112* 111  CO2 18*  --   --  16* 16*  16* 15* 18*  GLUCOSE 179*  --   --  126* 116*  117* 105* 115*  BUN 126*  --   --  115* 117*  117* 109* 101*  CREATININE 5.92*  --   --  5.53* 5.46*  5.45* 4.96* 4.51*  CALCIUM 8.7*  --   --  8.2* 8.3*  8.3* 8.2* 8.4*  PHOS 4.7*  --   --  5.3* 5.2* 4.7* 4.5  < > = values in this interval not displayed. GFR Estimated Creatinine Clearance: 13.9 mL/min (by C-G formula based on Cr of 4.51).  CBC:  Recent Labs Lab 01/27/16 0910  01/27/16 2019 01/28/16 0008 01/28/16 1245 01/29/16 0440 01/31/16 0344  WBC 14.1*  --   --  13.3* 11.3* 10.1 10.1  HGB 9.1*  < > 8.5* 8.8* 7.3* 8.0* 7.7*  HCT 27.0*  < > 25.0* 26.1* 21.9* 24.6* 23.5*  MCV 92.5  --   --  91.9 94.0 94.6 93.6  PLT 307  --   --  264 215 264 239  < > = values in this interval not displayed. CBG:  Recent Labs Lab 01/30/16 1148 01/30/16 1714 01/30/16 2127 01/31/16 0841 01/31/16 1308  GLUCAP 194* 189* 140* 133* 158*   Microbiology Recent Results (from the past 240 hour(s))  Aerobic/Anaerobic Culture (surgical/deep wound)     Status: None   Collection Time: 01/21/16  6:52 PM  Result Value Ref Range Status   Specimen Description WOUND LEFT WRIST  Final   Special Requests  NONE  Final   Gram Stain   Final    WBC PRESENT,BOTH PMN AND MONONUCLEAR NO ORGANISMS SEEN    Culture No growth aerobically or anaerobically.  Final   Report Status 01/26/2016 FINAL  Final  Surgical pcr screen     Status: Abnormal   Collection Time: 01/27/16  1:12 PM  Result Value Ref Range Status   MRSA, PCR NEGATIVE NEGATIVE Final   Staphylococcus aureus POSITIVE (A) NEGATIVE Final    Comment:        The Xpert SA Assay (FDA approved for NASAL specimens in patients over 34 years of age), is one component of a comprehensive surveillance program.  Test performance has been validated by [facility] for patients greater than or equal to 27 year old. It is not intended to diagnose infection nor to guide or monitor treatment.     Radiology: No results found.  Medications:   . sodium chloride   Intravenous Once  .  amiodarone  100 mg Oral Daily  . atorvastatin  80 mg Oral QHS  . Calcifediol ER  30 mcg Oral QHS  . carvedilol  25 mg Oral BID WC  .  ceFAZolin (ANCEF) IV  1 g Intravenous Q24H  . Chlorhexidine Gluconate Cloth  6 each Topical Daily  . insulin aspart  0-5 Units Subcutaneous QHS  . insulin aspart  0-9 Units Subcutaneous TID WC  . insulin aspart  6 Units Subcutaneous TID WC  . insulin glargine  5 Units Subcutaneous Daily  . [START ON 02/01/2016] isosorbide mononitrate  60 mg Oral Daily  . multivitamin with minerals  1 tablet Oral q morning - 10a  . mupirocin ointment  1 application Nasal BID  . polyethylene glycol  34 g Oral BID   Followed by  . [START ON 02/01/2016] polyethylene glycol  17 g Oral Daily  . predniSONE  20 mg Oral Q breakfast  . rifampin  600 mg Oral Daily  . senna-docusate  2 tablet Oral BID  . tamsulosin  0.4 mg Oral QPC supper   Continuous Infusions: . norepinephrine (LEVOPHED) Adult infusion Stopped (01/28/16 1815)  .  sodium bicarbonate 150 mEq in sterile water 1000 mL infusion 150 mEq (01/30/16 1120)  . sodium chloride irrigation          LOS: 15 days   ELGERGAWY, DAWOOD MD [facility] Pager [phone].    *Please refer to [email or web address], password [email or web address] to get updated schedule on who will round on this patient, as hospitalists switch teams weekly. If 7PM-7AM, please contact night-coverage at [email or web address], password [email or web address] for any overnight needs.  01/31/2016, 1:58 PM

## 2016-01-31 NOTE — Progress Notes (Signed)
Patient ID: [name] Brow., male   DOB: 1935/11/28, 80 y.o.   MRN: [id number] [facility] Progress Note   Assessment/ Plan:   1. 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 At this time appears to be nonoliguric with slow improvement of creatinine/renal function. I agree that with his multiple comorbidities and recently worsened deconditioning-dialysis would likely be of no benefit and in fact deleterious. Continue supportive management with blood pressure support/infection management. He has been seen by palliative care and another meeting is due this evening. 2. 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 Follow-up blood cultures have been negative to date. He is status post removal of pacemaker leads-procedure complicated by leak breakage and required retrieval from right femoral vein by vascular surgery. 3. 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 He is status post XRT and Lupron for prostate cancer. 4. Myelodysplastic syndrome/anemia: hemoglobin low but stable, no overt loss. 5. Anion gap metabolic acidosis: Improving with ongoing sodium bicarbonate therapy along with gradual improvement of renal function/supportive care.  Subjective:   Reports that he continues to feel weak and somewhat encouraged by the improvement of his renal function. He confirms to me that he does not want to undertake dialysis.    Objective:   BP 165/80 mmHg  Pulse 108  Temp(Src) 98.9 F (37.2 C) (Oral)  Resp 14  Ht 5\' 10"  (1.778 m)  Wt 84.5 kg (186 lb 4.6 oz)  BMI 26.73 kg/m2  SpO2 100%  Intake/Output Summary (Last 24 hours) at 01/31/16 [id number] Last data filed at 01/31/16 0600  Gross per 24 hour  Intake 1513.33 ml   Output   1600 ml  Net -86.67 ml   Weight change:   Physical Exam: Gen: Comfortably resting in bed-appears much weaker than I know him at baseline  CVS: Pulse regular tachycardia, S1 and S2 normal without gallop or rub  Resp:Poor inspiratory effort with decreased breath sounds over bases Abd: Soft, obese, nontender  Ext: 2+ lower extremity edema   Imaging: No results found.  Labs: BMET  Recent Labs Lab 01/25/16 0311 01/26/16 0513 01/27/16 0910 01/27/16 1854 01/27/16 2019 01/28/16 0536 01/29/16 0440 01/30/16 0400 01/31/16 0344  NA 130* 131* 133* 134* 136 135 135  136 136 136  K 4.3 4.5 4.2 4.4 4.5 4.1 4.2  4.2 3.8 3.6  CL 101 102 106  --   --  109 112*  112* 112* 111  CO2 20* 20* 18*  --   --  16* 16*  16* 15* 18*  GLUCOSE 154* 197* 179*  --   --  126* 116*  117* 105* 115*  BUN 114* 123* 126*  --   --  115* 117*  117* 109* 101*  CREATININE 5.12* 5.71* 5.92*  --   --  5.53* 5.46*  5.45* 4.96* 4.51*  CALCIUM 8.6* 8.2* 8.7*  --   --  8.2* 8.3*  8.3* 8.2* 8.4*  PHOS  --  4.1 4.7*  --   --  5.3* 5.2* 4.7* 4.5   CBC  Recent Labs Lab 01/28/16 0008 01/28/16 1245 01/29/16 0440 01/31/16 0344  WBC 13.3* 11.3* 10.1 10.1  HGB 8.8* 7.3* 8.0* 7.7*  HCT 26.1* 21.9* 24.6* 23.5*  MCV 91.9 94.0 94.6 93.6  PLT 264  215 264 239    Medications:    . sodium chloride   Intravenous Once  . amiodarone  100 mg Oral Daily  . atorvastatin  80 mg Oral QHS  . bisacodyl  10 mg Rectal BID  . Calcifediol ER  30 mcg Oral QHS  . carvedilol  6.25 mg Oral BID WC  .  ceFAZolin (ANCEF) IV  1 g Intravenous Q24H  . Chlorhexidine Gluconate Cloth  6 each Topical Daily  . insulin aspart  0-5 Units Subcutaneous QHS  . insulin aspart  0-9 Units Subcutaneous TID WC  . insulin aspart  6 Units Subcutaneous TID WC  . insulin glargine  5 Units Subcutaneous Daily  . isosorbide mononitrate  15 mg Oral Daily  . multivitamin with minerals  1 tablet Oral q morning - 10a  . mupirocin ointment  1  application Nasal BID  . polyethylene glycol  34 g Oral BID   Followed by  . [START ON 02/01/2016] polyethylene glycol  17 g Oral Daily  . predniSONE  20 mg Oral Q breakfast  . rifampin  600 mg Oral Daily  . senna-docusate  2 tablet Oral BID  . tamsulosin  0.4 mg Oral QPC supper      Elmarie Shiley, MD 01/31/2016, 8:06 AM

## 2016-02-01 DIAGNOSIS — Z515 Encounter for palliative care: Secondary | ICD-10-CM

## 2016-02-01 DIAGNOSIS — D638 Anemia in other chronic diseases classified elsewhere: Secondary | ICD-10-CM

## 2016-02-01 DIAGNOSIS — Z7189 Other specified counseling: Secondary | ICD-10-CM

## 2016-02-01 LAB — RENAL FUNCTION PANEL
ANION GAP: 8 (ref 5–15)
Albumin: 1.9 g/dL — ABNORMAL LOW (ref 3.5–5.0)
BUN: 89 mg/dL — ABNORMAL HIGH (ref 6–20)
CALCIUM: 8.2 mg/dL — AB (ref 8.9–10.3)
CHLORIDE: 110 mmol/L (ref 101–111)
CO2: 21 mmol/L — AB (ref 22–32)
Creatinine, Ser: 4.04 mg/dL — ABNORMAL HIGH (ref 0.61–1.24)
GFR calc Af Amer: 15 mL/min — ABNORMAL LOW (ref >60)
GFR calc non Af Amer: 13 mL/min — ABNORMAL LOW (ref >60)
GLUCOSE: 97 mg/dL (ref 65–99)
POTASSIUM: 3.3 mmol/L — AB (ref 3.5–5.1)
Phosphorus: 3.9 mg/dL (ref 2.5–4.6)
SODIUM: 139 mmol/L (ref 135–145)

## 2016-02-01 LAB — GLUCOSE, CAPILLARY
Glucose-Capillary: 122 mg/dL — ABNORMAL HIGH (ref 65–99)
Glucose-Capillary: 149 mg/dL — ABNORMAL HIGH (ref 65–99)
Glucose-Capillary: 214 mg/dL — ABNORMAL HIGH (ref 65–99)
Glucose-Capillary: 169 mg/dL — ABNORMAL HIGH (ref 65–99)
Glucose-Capillary: 219 mg/dL — ABNORMAL HIGH (ref 65–99)

## 2016-02-01 LAB — CBC
HEMATOCRIT: 24.8 % — AB (ref 39.0–52.0)
HEMOGLOBIN: 8.2 g/dL — AB (ref 13.0–17.0)
MCH: 30.6 pg (ref 26.0–34.0)
MCHC: 33.1 g/dL (ref 30.0–36.0)
MCV: 92.5 fL (ref 78.0–100.0)
Platelets: 222 10*3/uL (ref 150–400)
RBC: 2.68 MIL/uL — ABNORMAL LOW (ref 4.22–5.81)
RDW: 19.1 % — ABNORMAL HIGH (ref 11.5–15.5)
WBC: 10.6 10*3/uL — ABNORMAL HIGH (ref 4.0–10.5)

## 2016-02-01 LAB — TYPE AND SCREEN
ABO/RH(D): O POS
ANTIBODY SCREEN: NEGATIVE
UNIT DIVISION: 0

## 2016-02-01 MED ORDER — NEPRO/CARBSTEADY PO LIQD
237.0000 mL | Freq: Three times a day (TID) | ORAL | Status: DC
  Administered 2016-02-01 – 2016-02-03 (×4): 237 mL via ORAL

## 2016-02-01 MED ORDER — HYDRALAZINE HCL 25 MG PO TABS
25.0000 mg | ORAL_TABLET | Freq: Three times a day (TID) | ORAL | Status: DC
  Administered 2016-02-01 – 2016-02-03 (×8): 25 mg via ORAL
  Filled 2016-02-01 (×8): qty 1

## 2016-02-01 MED ORDER — SODIUM BICARBONATE 650 MG PO TABS
650.0000 mg | ORAL_TABLET | Freq: Two times a day (BID) | ORAL | Status: DC
  Administered 2016-02-01 – 2016-02-03 (×5): 650 mg via ORAL
  Filled 2016-02-01 (×5): qty 1

## 2016-02-01 MED ORDER — POTASSIUM CHLORIDE CRYS ER 20 MEQ PO TBCR: 30.0000 meq | EXTENDED_RELEASE_TABLET | Freq: Once | ORAL | Status: DC

## 2016-02-01 MED ORDER — POTASSIUM CHLORIDE CRYS ER 20 MEQ PO TBCR
20.0000 meq | EXTENDED_RELEASE_TABLET | Freq: Once | ORAL | Status: AC
  Administered 2016-02-01: 20 meq via ORAL
  Filled 2016-02-01: qty 1

## 2016-02-01 MED ORDER — HYDRALAZINE HCL 20 MG/ML IJ SOLN
5.0000 mg | INTRAMUSCULAR | Status: DC | PRN
  Administered 2016-02-01: 5 mg via INTRAVENOUS
  Filled 2016-02-01: qty 1

## 2016-02-01 MED ORDER — ISOSORBIDE MONONITRATE ER 30 MG PO TB24
120.0000 mg | ORAL_TABLET | Freq: Every day | ORAL | Status: DC
  Administered 2016-02-02 – 2016-02-03 (×2): 120 mg via ORAL
  Filled 2016-02-01 (×2): qty 4

## 2016-02-01 MED ORDER — CEFAZOLIN IN D5W 1 GM/50ML IV SOLN
1.0000 g | Freq: Two times a day (BID) | INTRAVENOUS | Status: DC
  Administered 2016-02-01: 1 g via INTRAVENOUS
  Administered 2016-02-02: 2 g via INTRAVENOUS
  Administered 2016-02-02 – 2016-02-03 (×3): 1 g via INTRAVENOUS
  Filled 2016-02-01 (×6): qty 50

## 2016-02-01 NOTE — Progress Notes (Signed)
Progress Note    [name]  ATF:[id number] DOB: 1935/11/13  DOA: 01/16/2016 PCP: [name], MD    Brief Narrative:   [name] Poser. is an 80 y.o. male with a PMH of paroxysmal atrial fibrillation/pacemaker not on anticoagulation secondary to history of GI bleed, CAD, stage IV CKD, CAD, hypertension,Liddle's syndrome, myelodysplastic syndrome, PVD and hyperlipidemia who was admitted 01/16/16 with multifocal pneumonia complicated by coagulopathy and hematuria. Blood cultures were positive for MSSA. Patient subsequently developed severe wrist pain and underwent arthrotomy of left wrist joint 01/21/16. Pseudogout suspected given negative synovial fluid cultures. Echocardiogram done which showed echodensity on RA lead, s/p PPM system extraction 7/18 by Dr. Lovena Le (Required vascular surgery to retrieve RV lead tip from RFV)  Assessment/Plan:   Staphylococcus aureus bacteremia with Evidence of vegetation on on atrial pacing lead pacemaker , and  right lower lobe pneumonia/Multifocal pneumonia - Blood culture growing MSSA on 01/16/2016, blood cultures 01/17/16 with no growth to date - Antibiotic management per ID, currently on rifampin and Ancef to finish total of 4 weeks through 02/12/2016 - s/p PPM system extraction 7/18 by Dr. Lovena Le (Required vascular surgery to retrieve RV lead tip from RFV), Per ID recs its safe to replace a new pacemaker 14 days after the first negative blood culture But currently no plan by  EP to replace his pacemaker. - TEE done 01/26/16 with no evidence of vegetation,  - Palliative care consultation requested given overall failure to thrive, poor prognosis with progressive renal failure (does not want to consider HD and would likely not be a good candidate.   hypertension - With brief episodes of hypotension on 7/19, required brief pressor support, since the blood pressure has been gradually increasing, is groomed gradually on his previous meds, currently  back on Coreg full dose, will increase Imdur 60-120 mg daily, will resume back on hydralazine.  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 5 cm masslike area consistent with a blood clot.  - Evaluated by urology with a cystoscopy performed, status post bladder clot evacuation and bladder irrigation. Management per urology, initially on CBI , remains with significant hematuria,started on foley flushes every 4 hours.  Anemia - Multifactorial, anemia of chronic kidney disease, and chronic blood loss in the setting of hematuria, required multiple blood transfusions, series 20 units PRBC yesterday, hemoglobin improved from 7.7-8.2   Coronary artery disease/chronic diastolic CHF  - Status post CABG. Aspirin/Plavix currently on hold secondary to hematuria and hemoptysis.  - Continue Lipitor,  - Resume Imdur and Coreg giving improving hypotension - No evidence of heart failure exacerbation. Diuretics currently on hold.  PAF (paroxysmal atrial fibrillation) (HCC) status post pacemaker for sick sinus syndrome - CHA2DS2Vasc is at least 4, but not felt to be a candidate for anticoagulation given history of GI bleeding. Continue amiodarone.  Acute renal failure superimposed on stage IV chronic kidney disease/renal artery stenosis - Baseline creatinine 3.8. Creatinine rising. Creatinine peaked at 0.9, most likely prerenal state/evolution of ischemic ATN, appears to be improving, bicarbonate infusion. Today. - Hypokalemia with potassium of 3.3, will replete.  Wrist swelling secondary to probable pseudogout - Seen by orthopedics, status post incision and drainage 01/21/16. Sinoatrial Fluid cultures negative ,CPPD crystals noted, findings most consistent with pseudogout. Will follow-up with Dr. Grandville Silos. On prednisone, will start on taper.  Liddle's syndrome - Monitor for hypokalemia and high blood pressure.  Myelodysplastic syndrome/anemia of  chronic disease - Gets Epogen  injections every 2 weeks at the cancer center, last use as needed.  Diabetes mellitus with peripheral artery disease - Usually diet-controlled at home, continue with insulin sliding scale,  and 6 units NovoLog before meals , overall acceptable, will discontinue low-dose Lantus started yesterday.  Gastroparesis  Prostate cancer Gets Lupron injections every 4 months. S/P external beam radiation.  Protein calorie malnutrition - We'll start on Nepro.   Family Communication/Anticipated D/C date and plan/Code Status   DVT prophylaxis: SCDs ordered. Code Status: DNR Family Communication Discussed D/W patient , none at bedside Disposition Plan:  likely several more days given current sepsis/pacemaker endocarditis, palliative meeting today with patient and wife regarding goals of care.   Medical Consultants:    Cardiology  Orthopedic Surgery  Infectious Disease  Nephrology  Urology  PCCM  Palliative    Procedures:   - 1 unit PRBC transfusion on 7/17, and 1 unit on 7/18, and 1 unit on 7/22 - s/p PPM system extraction 7/18 by Dr. Lovena Le (Required vascular surgery to retrieve RV lead tip from RFV) - IR service for placement of tunneled (L)IJ PICC on 7/16 (removed 5/62 and during complicated pacing system extraction on 7/18. - Right IJ TLC on 7/18   2 D Echo 01/24/16:  Dynamic obstruction with peak velocity of 351 cm/sec. no regional wall motion abnormalities. Grade 1 diastolic dysfunction. Mobile echodensity in the right atrium attached to pacer wire. Mild systolic pulmonary artery hypertension.  Dg Chest 2 View  Result Date: 01/16/2016 CLINICAL DATA:  Chest pain, cough for 2 days EXAM: CHEST  2 VIEW COMPARISON:  CT chest 04/03/2014 FINDINGS: Cardiomediastinal silhouette is unremarkable. Status post median sternotomy. Dual lead cardiac pacemaker with leads in right atrium and right ventricle. There is streaky airspace opacification in right base  infrahilar region and right upper lobe peripheral perihilar. Findings highly suspicious for pneumonia. Probable small right pleural effusion. Left lung is clear. IMPRESSION: There is streaky airspace opacification in right base infrahilar region and right upper lobe peripheral perihilar. Findings highly suspicious for pneumonia. Followup PA and lateral chest X-ray is recommended in 3-4 weeks following trial of antibiotic therapy to ensure resolution and exclude underlying malignancy. Probable small right pleural effusion. Electronically Signed   By: Lahoma Crocker M.D.   On: 01/16/2016 13:58   Dg Wrist 2 Views Left  Result Date: 01/18/2016 CLINICAL DATA:  Acute onset of left wrist pain.  Initial encounter. EXAM: LEFT WRIST - 2 VIEW COMPARISON:  None. FINDINGS: There is no evidence of fracture or dislocation. There is joint space narrowing along the radiocarpal joint, and at the radial aspect of the carpal rows. This resolves in mild chronic deformity of the scaphoid and lunate, with expansion of the scapholunate distance to 5 mm. Scattered vascular calcifications are seen. No additional soft tissue abnormalities are characterized on radiograph. IMPRESSION: 1. No evidence of fracture or dislocation. 2. Degenerative joint space narrowing along the radiocarpal joints, and at the radial aspect of the carpal rows. This results in mild chronic deformity of the scaphoid and lunate, with expansion of the scapholunate distance to 5 mm, reflecting scapholunate dissociation. Electronically Signed   By: Garald Balding M.D.   On: 01/18/2016 16:57   Ct Chest Wo Contrast  Result Date: 01/16/2016 CLINICAL DATA:  Hemoptysis, cough.  Symptoms since Monday. EXAM: CT CHEST WITHOUT CONTRAST TECHNIQUE: TECHNIQUE Multidetector CT imaging of the chest was performed following the standard protocol without IV contrast. COMPARISON:  Chest radiograph 01/16/2016, CT 04/03/2014 FINDINGS: Cardiovascular: Calcification of  the thoracic aorta.  Coronary bypass graft. Pacer wires in the RIGHT heart. No pericardial fluid. Mediastinum/Nodes: No axillary or supraclavicular adenopathy. No mediastinal hilar adenopathy. Esophagus normal. Lungs/Pleura: There is consolidative airspace disease in the RIGHT lower lobe with air bronchograms. A similar pattern in the posterior aspect the RIGHT upper lobe. Smaller pattern of consolidation in the LEFT lower lobe. Findings are consistent with multifocal pneumonia. Upper Abdomen: Limited view of the liver, kidneys, pancreas are unremarkable. Normal adrenal glands. Musculoskeletal: No aggressive osseous lesion.  Midline sternotomy. IMPRESSION: 1. Multifocal pneumonia with most dense infection in RIGHT lower lobe. 2. Coronary artery calcification and aortic atherosclerotic calcification. Electronically Signed   By: Suzy Bouchard M.D.   On: 01/16/2016 16:32   US Renal  Result Date: 01/17/2016 CLINICAL DATA:  80 year old male with microscopic hematuria. Personal history of prostate cancer. Initial encounter. EXAM: RENAL / URINARY TRACT ULTRASOUND COMPLETE COMPARISON:  CT Abdomen and Pelvis 08/30/2015, and earlier. Right upper quadrant ultrasound from today reported separately. FINDINGS: Right Kidney: Length: 12.3 cm. Echogenicity within normal limits. No mass or hydronephrosis visualized. Left Kidney: Length: 12.0 cm. Echogenicity within normal limits. No mass or hydronephrosis visualized. Bladder: 5 cm area which is dependent, complex, and mass like (image 15), however, there is no internal vascularity detected with brief color Doppler interrogation. Elsewhere the urinary bladder appears normal. Prostate measured to be 5.2 x 3.1 x 4.3 cm. IMPRESSION: 1. Abnormal 5 cm complex, mass-like area of dependent echogenicity in the urinary bladder. However, lack of internal vascularity on Doppler suggests this is more likely blood clot rather than a bladder tumor. Further evaluation recommended. 2. Normal for age sonographic  appearance of both kidneys. Electronically Signed   By: Genevie Ann M.D.   On: 01/17/2016 08:01   US Renal  Result Date: 01/09/2016 CLINICAL DATA:  Two weeks of hematuria ; history of prostate enlargement EXAM: RENAL / URINARY TRACT ULTRASOUND COMPLETE COMPARISON:  Abdominal pelvic CT scan of July 30, 2015 FINDINGS: Right Kidney: Length: 11.5 cm. The renal cortical echotexture is equal to or slightly greater than that of the adjacent liver. There is no hydronephrosis nor discrete mass. Left Kidney: Length: 10.3 cm. The renal cortical echotexture is mildly increased similar to that on the right. There is no hydronephrosis. Bladder: There is an irregular mass in the posterior aspect of the urinary bladder wall measuring 2.5 x 2 x 4.4 cm. This appears separate from the more anteriorly and inferiorly positioned prostate gland. The prostate gland itself does produce a mild impression upon the urinary bladder base. IMPRESSION: 1. Posterior bladder wall mass worrisome for malignancy. This is separate from the mildly enlarged prostate gland. 2. Increased renal cortical echotexture bilaterally consistent with medical renal disease. There is no hydronephrosis. Electronically Signed   By: David  Martinique M.D.   On: 01/09/2016 14:44   Ir Fluoro Guide Cv Line Left  Result Date: 01/25/2016 INDICATION: Bacteremia due to Staph aureus infection. History of chronic renal insufficiency. Request made for placement of a tunneled PICC line for antibiotic administration. EXAM: TUNNELED PICC LINE WITH ULTRASOUND AND FLUOROSCOPIC GUIDANCE MEDICATIONS: The patient is currently admitted to the hospital and receiving intravenous antibiotics. The antibiotic was given in an appropriate time interval prior to skin puncture. ANESTHESIA/SEDATION: Moderate (conscious) sedation was employed during this procedure. A total of Versed 0.5 mg and Fentanyl 25 mcg was administered intravenously. Moderate Sedation Time: 33 minutes. The patient's level  of consciousness and vital signs were monitored continuously by radiology nursing throughout the  procedure under my direct supervision. COMPLICATIONS: None immediate. PROCEDURE: Informed written consent was obtained from the patient after a discussion of the risks, benefits, and alternatives to treatment. Questions regarding the procedure were encouraged and answered. Given the presence of the right anterior chest wall pacemaker, the decision was made to place a left internal jugular approach dual lumen PICC line. As such, the left neck and chest were prepped with chlorhexidine in a sterile fashion, and a sterile drape was applied covering the operative field. Maximum barrier sterile technique with sterile gowns and gloves were used for the procedure. A timeout was performed prior to the initiation of the procedure. After creating a small venotomy incision, a micropuncture kit was utilized to access the left internal jugular vein under direct, real-time ultrasound guidance after the overlying soft tissues were anesthetized with 1% lidocaine with epinephrine. Ultrasound image documentation was performed. Despite several attempts, there was difficulty advancing the micro wire centrally. As such, a limited central venogram was performed via the inner 3 French sheath from the micropuncture kit demonstrating patency though marked tortuosity of the central aspect of the left internal jugular vein. Ultimately, a Nitrex wire was able to be advanced centrally. The microwire was kinked to measure appropriate catheter length. The micropuncture sheath was exchanged for a peel-away sheath over a guidewire. Initially, a 5 [place] dual lumen tunneled PICC measuring 28 cm was tunneled in a retrograde fashion from the anterior chest wall to the venotomy incision. Unfortunately, the initial catheter was noted to retract within the tortuous left innominate vein with with tip terminating at the level of the SVC. As such, the existing  PICC line was cannulated with a GT Glidewire which was advanced to the level of the IVC. Under intermittent fluoroscopic guidance, the existing 28 cm tunneled PICC line was exchanged for a new slightly longer 31 cm dual lumen tunneled PICC line with tip ultimately terminating within the superior aspect the right atrium. Final catheter positioning was confirmed and documented with a spot radiographic image. The catheter aspirates and flushes normally. The catheter was flushed with appropriate volume heparin dwells. The catheter exit site was secured with a 0-Prolene retention suture. The venotomy incision was closed with Dermabond and Steri-strips. Dressings were applied. The patient tolerated the procedure well without immediate post procedural complication. FINDINGS: After catheter placement, the tip lies within the superior cavoatrial junction. The catheter aspirates and flushes normally and is ready for immediate use. IMPRESSION: Successful placement of 31 cm dual lumen tunneled PICC catheter via the left internal jugular vein with tip terminating at the superior caval atrial junction. The catheter is ready for immediate use. Electronically Signed   By: Sandi Mariscal M.D.   On: 01/25/2016 13:51   Ir US Guide Vasc Access Left  Result Date: 01/25/2016 INDICATION: Bacteremia due to Staph aureus infection. History of chronic renal insufficiency. Request made for placement of a tunneled PICC line for antibiotic administration. EXAM: TUNNELED PICC LINE WITH ULTRASOUND AND FLUOROSCOPIC GUIDANCE MEDICATIONS: The patient is currently admitted to the hospital and receiving intravenous antibiotics. The antibiotic was given in an appropriate time interval prior to skin puncture. ANESTHESIA/SEDATION: Moderate (conscious) sedation was employed during this procedure. A total of Versed 0.5 mg and Fentanyl 25 mcg was administered intravenously. Moderate Sedation Time: 33 minutes. The patient's level of consciousness and vital  signs were monitored continuously by radiology nursing throughout the procedure under my direct supervision. COMPLICATIONS: None immediate. PROCEDURE: Informed written consent was obtained from the patient after a  discussion of the risks, benefits, and alternatives to treatment. Questions regarding the procedure were encouraged and answered. Given the presence of the right anterior chest wall pacemaker, the decision was made to place a left internal jugular approach dual lumen PICC line. As such, the left neck and chest were prepped with chlorhexidine in a sterile fashion, and a sterile drape was applied covering the operative field. Maximum barrier sterile technique with sterile gowns and gloves were used for the procedure. A timeout was performed prior to the initiation of the procedure. After creating a small venotomy incision, a micropuncture kit was utilized to access the left internal jugular vein under direct, real-time ultrasound guidance after the overlying soft tissues were anesthetized with 1% lidocaine with epinephrine. Ultrasound image documentation was performed. Despite several attempts, there was difficulty advancing the micro wire centrally. As such, a limited central venogram was performed via the inner 3 French sheath from the micropuncture kit demonstrating patency though marked tortuosity of the central aspect of the left internal jugular vein. Ultimately, a Nitrex wire was able to be advanced centrally. The microwire was kinked to measure appropriate catheter length. The micropuncture sheath was exchanged for a peel-away sheath over a guidewire. Initially, a 5 [place] dual lumen tunneled PICC measuring 28 cm was tunneled in a retrograde fashion from the anterior chest wall to the venotomy incision. Unfortunately, the initial catheter was noted to retract within the tortuous left innominate vein with with tip terminating at the level of the SVC. As such, the existing PICC line was cannulated with  a GT Glidewire which was advanced to the level of the IVC. Under intermittent fluoroscopic guidance, the existing 28 cm tunneled PICC line was exchanged for a new slightly longer 31 cm dual lumen tunneled PICC line with tip ultimately terminating within the superior aspect the right atrium. Final catheter positioning was confirmed and documented with a spot radiographic image. The catheter aspirates and flushes normally. The catheter was flushed with appropriate volume heparin dwells. The catheter exit site was secured with a 0-Prolene retention suture. The venotomy incision was closed with Dermabond and Steri-strips. Dressings were applied. The patient tolerated the procedure well without immediate post procedural complication. FINDINGS: After catheter placement, the tip lies within the superior cavoatrial junction. The catheter aspirates and flushes normally and is ready for immediate use. IMPRESSION: Successful placement of 31 cm dual lumen tunneled PICC catheter via the left internal jugular vein with tip terminating at the superior caval atrial junction. The catheter is ready for immediate use. Electronically Signed   By: Sandi Mariscal M.D.   On: 01/25/2016 13:51   Dg Chest Port 1 View  Result Date: 01/28/2016 CLINICAL DATA:  Weakness, infected pacemaker EXAM: PORTABLE CHEST 1 VIEW COMPARISON:  Portable chest x-ray of January 27, 2016 FINDINGS: The left lung is well-expanded and clear. On the right there is mild volume loss with increased density in the upper and lower lobes which is stable. There is no pneumothorax. There is biapical pleural thickening the heart is normal in size. There is dense calcification in the aortic arch. The sternal wires are intact. The right internal jugular venous catheter tip projects over the distal third of the SVC just above the cavoatrial junction. IMPRESSION: 1. Persistent infiltrate in the right upper and lower lobes. Small right pleural effusion. No definite pneumothorax. 2.  The left lung is clear.  No CHF.  Aortic atherosclerosis. Electronically Signed   By: David  Martinique M.D.   On: 01/28/2016 07:11  Dg Chest Portable 1 View  Result Date: 01/27/2016 CLINICAL DATA:  Right IJ placement EXAM: PORTABLE CHEST 1 VIEW COMPARISON:  January 16, 2016 FINDINGS: A right IJ has been placed in the interval, probably terminating just within the right side of the atrium, approximately 2 cm inferior to the caval atrial junction. No pneumothorax. Opacity remains in the right mid lung, less focal in the interval. Opacity and effusion remains in the left lung base, also less focal in the interval. No other interval changes. IMPRESSION: 1. The distal tip of the new right IJ probably terminates approximately 2 cm below the caval atrial junction, just within the right side of the atrium. Recommend repositioning as clinically warranted. No pneumothorax. 2. The right-sided pulmonary infiltrates are less focal in the interval but remain. There is probably a small amount of layering effusion on the right as well. Electronically Signed   By: Dorise Bullion III M.D   On: 01/27/2016 19:55   Dg Cyndy Freeze Guided Needle Plc Aspiration/injection Loc  Result Date: 01/21/2016 CLINICAL DATA:  Left wrist pain and swelling.  Sepsis. EXAM: LEFT WRIST ASPIRATION UNDER FLUOROSCOPY FLUOROSCOPY TIME:  Fluoroscopy Time (in minutes and seconds): 24 SECONDS PROCEDURE: Overlying skin prepped with Betadine, draped in the usual sterile fashion, and infiltrated locally with buffered Lidocaine. 20 gauge needle advanced into the radiocarpal joint under direct fluoroscopic visualization. Approximately 3 cc of bloody fluid was aspirated from the joint and overlying soft tissues. IMPRESSION: Left radiocarpal joint aspiration performed without immediate complication. Mr. Dollins with tolerated the procedure well. Technically successful  hip injection under fluoroscopy. Electronically Signed   By: Lorriane Shire M.D.   On: 01/21/2016 08:10    US Abdomen Limited Ruq  Result Date: 01/17/2016 CLINICAL DATA:  80 year old male with abnormal LFTs. Prior cholecystectomy. Pneumonia. Initial encounter. EXAM: US ABDOMEN LIMITED - RIGHT UPPER QUADRANT COMPARISON:  Noncontrast chest CT 01/16/2016. CT Abdomen and Pelvis 08/30/2015 and earlier FINDINGS: Gallbladder: Surgically absent Common bile duct: Diameter: 6 mm, normal Liver: Chronic 2.6 cm left hepatic lobe cyst (image 18) does demonstrate mild wall thickening, however, this lesion is stable on multiple prior CTs back to 2011. Similar 3.1 cm right lobe cyst, also not significantly changed since 2011. Underlying hepatic echotexture is within normal limits. No intrahepatic biliary ductal dilatation is evident. No new liver lesion identified. Other findings: Negative visible right kidney. No free fluid identified. IMPRESSION: No acute liver or biliary findings. 2-3 cm liver cysts which are mildly thick walled but benign, having not significantly changed since 2011. Electronically Signed   By: Genevie Ann M.D.   On: 01/17/2016 07:56    Anti-Infectives:   Azithromycin 01/16/16---> 01/17/16 Rocephin 01/16/16---> 01/17/16 Ancef 01/17/16---> Rifampin 01/17/16 --->  Subjective:   [name] D Bonaventure Sr. continues to feel weak , Otherwise denies any complaints.    Objective:    Vitals:   02/01/16 0400 02/01/16 0500 02/01/16 0605 02/01/16 0800  BP: (!) 169/71 (!) 157/75 (!) 169/77 (!) 176/92  Pulse: 83 87 92 100  Resp: '12 16 16 16  '$ Temp: 98.9 F (37.2 C)   99.3 F (37.4 C)  TempSrc: Oral   Oral  SpO2: 100% 100% 99% 99%  Weight:      Height:        Intake/Output Summary (Last 24 hours) at 02/01/16 1052 Last data filed at 02/01/16 0523  Gross per 24 hour  Intake             1225 ml  Output  1975 ml  Net             -750 ml   Filed Weights   01/26/16 0628 01/28/16 0700 01/30/16 1238  Weight: 72.4 kg (159 lb 9.6 oz) 76.7 kg (169 lb) 84.5 kg (186 lb 4.6 oz)    Exam: General exam:  Weak,Laying in bed Respiratory system: Clear to auscultation. Respiratory effort normal. Cardiovascular system: RRR, No JVD,  rubs, gallops or clicks. Grade 2/6 SEM. Gastrointestinal system: Abdomen is nondistended, soft and nontender. Normal bowel sounds heard. Central nervous system: Awake, communicative, appropriate. No focal neurological deficits. Extremities: Left hand markedly erythematous/swollen and tender with limited range of motion of fingers. Skin: No rashes, lesions or ulcers Psychiatry: Judgement and insight appear normal. Mood & affect appropriate.   Data Reviewed:   I have personally reviewed following labs and imaging studies:  Labs: Basic Metabolic Panel:  Recent Labs Lab 01/28/16 0536 01/29/16 0440 01/30/16 0400 01/31/16 0344 02/01/16 0450  NA 135 135  136 136 136 139  K 4.1 4.2  4.2 3.8 3.6 3.3*  CL 109 112*  112* 112* 111 110  CO2 16* 16*  16* 15* 18* 21*  GLUCOSE 126* 116*  117* 105* 115* 97  BUN 115* 117*  117* 109* 101* 89*  CREATININE 5.53* 5.46*  5.45* 4.96* 4.51* 4.04*  CALCIUM 8.2* 8.3*  8.3* 8.2* 8.4* 8.2*  PHOS 5.3* 5.2* 4.7* 4.5 3.9   GFR Estimated Creatinine Clearance: 15.6 mL/min (by C-G formula based on SCr of 4.04 mg/dL).  CBC:  Recent Labs Lab 01/28/16 0008 01/28/16 1245 01/29/16 0440 01/31/16 0344 02/01/16 0450  WBC 13.3* 11.3* 10.1 10.1 10.6*  HGB 8.8* 7.3* 8.0* 7.7* 8.2*  HCT 26.1* 21.9* 24.6* 23.5* 24.8*  MCV 91.9 94.0 94.6 93.6 92.5  PLT 264 215 264 239 222   CBG:  Recent Labs Lab 01/31/16 0841 01/31/16 1308 01/31/16 1639 01/31/16 2228 02/01/16 0836  GLUCAP 133* 158* 95 108* 122*   Microbiology Recent Results (from the past 240 hour(s))  Surgical pcr screen     Status: Abnormal   Collection Time: 01/27/16  1:12 PM  Result Value Ref Range Status   MRSA, PCR NEGATIVE NEGATIVE Final   Staphylococcus aureus POSITIVE (A) NEGATIVE Final    Comment:        The Xpert SA Assay (FDA approved for NASAL  specimens in patients over 26 years of age), is one component of a comprehensive surveillance program.  Test performance has been validated by [facility] for patients greater than or equal to 26 year old. It is not intended to diagnose infection nor to guide or monitor treatment.     Radiology: No results found.  Medications:   . sodium chloride   Intravenous Once  . amiodarone  100 mg Oral Daily  . atorvastatin  80 mg Oral QHS  . Calcifediol ER  30 mcg Oral QHS  . carvedilol  25 mg Oral BID WC  .  ceFAZolin (ANCEF) IV  1 g Intravenous Q24H  . feeding supplement (NEPRO CARB STEADY)  237 mL Oral TID WC  . hydrALAZINE  25 mg Oral Q8H  . insulin aspart  0-5 Units Subcutaneous QHS  . insulin aspart  0-9 Units Subcutaneous TID WC  . insulin aspart  6 Units Subcutaneous TID WC  . insulin glargine  5 Units Subcutaneous Daily  . [START ON 02/02/2016] isosorbide mononitrate  120 mg Oral Daily  . multivitamin with minerals  1 tablet Oral q morning -  10a  . mupirocin ointment  1 application Nasal BID  . polyethylene glycol  17 g Oral Daily  . potassium chloride  30 mEq Oral Once  . predniSONE  20 mg Oral Q breakfast  . rifampin  600 mg Oral Daily  . senna-docusate  2 tablet Oral BID  . sodium bicarbonate  650 mg Oral BID  . tamsulosin  0.4 mg Oral QPC supper   Continuous Infusions: . norepinephrine (LEVOPHED) Adult infusion Stopped (01/28/16 1815)  . sodium chloride irrigation         LOS: 16 days   ELGERGAWY, DAWOOD MD [facility] Pager [phone].    *Please refer to [email or web address], password [email or web address] to get updated schedule on who will round on this patient, as hospitalists switch teams weekly. If 7PM-7AM, please contact night-coverage at [email or web address], password [email or web address] for any overnight needs.  02/01/2016, 10:52 AM

## 2016-02-01 NOTE — Progress Notes (Signed)
Pharmacy Antibiotic Note  [name]. is a 80 y.o. male admitted on 01/16/2016 with multifocal PNA per chest CT and  MSSA bacteremia. Pacemaker was removed 7/18. TEE negative for vegetation.  Pharmacy was consulted on 01/17/16 for cefazolin dosing (also on rifampin per MD). Per ID plans to continue Ancef until 02/12/2016  Renal function is improving although baseline CKD IV, SCr down to 4.04, UOP is good.  Plan: -Increase Ancef to 1g IV q12h -Continue Rifampin 600mg  PO q24h per MD -Pharmacy will sign off but will adjust Ancef peripherally if needed  Height: 5\' 10"  (177.8 cm) Weight: 186 lb 4.6 oz (84.5 kg) IBW/kg (Calculated) : 73  Temp (24hrs), Avg:98.8 F (37.1 C), Min:98.4 F (36.9 C), Max:99.3 F (37.4 C)   Recent Labs Lab 01/28/16 0008 01/28/16 0536 01/28/16 1245 01/29/16 0440 01/30/16 0400 01/31/16 0344 02/01/16 0450  WBC 13.3*  --  11.3* 10.1  --  10.1 10.6*  CREATININE  --  5.53*  --  5.46*  5.45* 4.96* 4.51* 4.04*  LATICACIDVEN  --   --  0.6  --   --   --   --     Estimated Creatinine Clearance: 15.6 mL/min (by C-G formula based on SCr of 4.04 mg/dL).    Allergies  Allergen Reactions  . Nsaids Other (See Comments)    GI issue  . Ibuprofen Other (See Comments)    GI Issues  . Ace Inhibitors Cough    Antimicrobials this admission: Azith 7/7 x1 Rocephin 7/7 x1 Cefazolin 7/8 >> (8/3) Rifampin 7/8 >> (8/3)  Dose adjustments this admission: 7/16: Ancef decreased to 1g IV q24h 7/23: Ancef increased to 1 g IV q12h  Microbiology results: 7/7 BCx x2: MSSA 2/2 7/8 BCx x2: neg 7/10 left wrist synovial fluid cx: neg 7/12 wound left wrist: neg 7/18 MRSA PCR: pos for staph aureus  Renold Genta, PharmD, BCPS Clinical Pharmacist Pager: [phone] 02/01/2016 11:26 AM

## 2016-02-01 NOTE — Progress Notes (Signed)
Referring Physician(s): Dr. Phillips Climes  Supervising Physician: Jacqulynn Cadet  Patient Status:  Inpatient  Chief Complaint: Bacteremia  Subjective: Pt known to IR service for placement of tunneled (L)IJ PICC on 7/16 for bacteremia. However, this had to be removed during complicated pacing system extraction on 7/18. He has had (R)IJ central line since then. He is now in need of another tunneled CVC to continue long term abx. Chart reviewed and pt seen.  Allergies: Nsaids; Ibuprofen; and Ace inhibitors  Medications:  Current Facility-Administered Medications:  .  0.9 %  sodium chloride infusion, , Intravenous, Once, Renee Dyane Dustman, PA-C .  acetaminophen (TYLENOL) tablet 325-650 mg, 325-650 mg, Oral, Q4H PRN, Evans Lance, MD .  acetaminophen (TYLENOL) tablet 325-650 mg, 325-650 mg, Oral, Q4H PRN, Evans Lance, MD, 650 mg at 01/29/16 2205 .  acetaminophen (TYLENOL) tablet 650 mg, 650 mg, Oral, Q6H PRN, Dionne Milo, NP, 650 mg at 01/22/16 2042 .  amiodarone (PACERONE) tablet 100 mg, 100 mg, Oral, Daily, Bonnell Public, MD, 100 mg at 01/31/16 0958 .  atorvastatin (LIPITOR) tablet 80 mg, 80 mg, Oral, QHS, Bonnell Public, MD, 80 mg at 01/31/16 2107 .  Calcifediol ER CPCR 30 mcg, 30 mcg, Oral, QHS, Mir Marry Guan, MD, 30 mcg at 01/31/16 2107 .  carvedilol (COREG) tablet 25 mg, 25 mg, Oral, BID WC, Albertine Patricia, MD, 25 mg at 01/31/16 1752 .  ceFAZolin (ANCEF) IVPB 1 g/50 mL premix, 1 g, Intravenous, Q24H, Venetia Maxon Rama, MD, 1 g at 01/31/16 1751 .  Chlorhexidine Gluconate Cloth 2 % PADS 6 each, 6 each, Topical, Daily, Albertine Patricia, MD, 6 each at 01/31/16 1000 .  feeding supplement (NEPRO CARB STEADY) liquid 237 mL, 237 mL, Oral, TID WC, Dawood S Elgergawy, MD .  hydrALAZINE (APRESOLINE) injection 5 mg, 5 mg, Intravenous, Q4H PRN, Silver Huguenin Elgergawy, MD .  hydrALAZINE (APRESOLINE) tablet 25 mg, 25 mg, Oral, Q8H, Dawood S Elgergawy,  MD .  insulin aspart (novoLOG) injection 0-5 Units, 0-5 Units, Subcutaneous, QHS, Venetia Maxon Rama, MD, 2 Units at 01/29/16 2204 .  insulin aspart (novoLOG) injection 0-9 Units, 0-9 Units, Subcutaneous, TID WC, Venetia Maxon Rama, MD, 1 Units at 02/01/16 0859 .  insulin aspart (novoLOG) injection 6 Units, 6 Units, Subcutaneous, TID WC, Venetia Maxon Rama, MD, 6 Units at 01/31/16 1450 .  insulin glargine (LANTUS) injection 5 Units, 5 Units, Subcutaneous, Daily, Albertine Patricia, MD, 5 Units at 01/31/16 [phone] .  isosorbide mononitrate (IMDUR) 24 hr tablet 60 mg, 60 mg, Oral, Daily, Silver Huguenin Elgergawy, MD .  multivitamin with minerals tablet 1 tablet, 1 tablet, Oral, q morning - 10a, Bonnell Public, MD, 1 tablet at 01/31/16 0957 .  mupirocin ointment (BACTROBAN) 2 % 1 application, 1 application, Nasal, BID, Albertine Patricia, MD, 1 application at [id number] 2107 .  norepinephrine (LEVOPHED) 4 mg in dextrose 5 % 250 mL (0.016 mg/mL) infusion, 2-50 mcg/min, Intravenous, Continuous, Marijean Heath, NP, Stopped at 01/28/16 1815 .  ondansetron (ZOFRAN) injection 4 mg, 4 mg, Intravenous, Q6H PRN, Evans Lance, MD .  ondansetron [facility]) injection 4 mg, 4 mg, Intravenous, Q6H PRN, Evans Lance, MD .  oxyCODONE-acetaminophen (PERCOCET/ROXICET) 5-325 MG per tablet 1 tablet, 1 tablet, Oral, Q3H PRN, Venetia Maxon Rama, MD, 1 tablet at 02/01/16 0605 .  [EXPIRED] polyethylene glycol (MIRALAX / GLYCOLAX) packet 34 g, 34 g, Oral, BID, 34 g at 01/31/16 0956 **FOLLOWED BY** polyethylene glycol (MIRALAX /  GLYCOLAX) packet 17 g, 17 g, Oral, Daily, Dawood S Elgergawy, MD .  predniSONE (DELTASONE) tablet 20 mg, 20 mg, Oral, Q breakfast, Silver Huguenin Elgergawy, MD, 20 mg at 01/31/16 0800 .  rifampin (RIFADIN) capsule 600 mg, 600 mg, Oral, Daily, Michel Bickers, MD, 600 mg at 01/31/16 0957 .  senna-docusate (Senokot-S) tablet 2 tablet, 2 tablet, Oral, BID, Albertine Patricia, MD, 2 tablet at 01/31/16 [phone] .  sodium  bicarbonate tablet 650 mg, 650 mg, Oral, BID, Elmarie Shiley, MD .  sodium chloride flush (NS) 0.9 % injection 10-40 mL, 10-40 mL, Intracatheter, PRN, Venetia Maxon Rama, MD .  sodium chloride irrigation 0.9 % 3,000 mL, 3,000 mL, Irrigation, Continuous, Venetia Maxon Rama, MD, 3,000 mL at 01/29/16 0829 .  tamsulosin (FLOMAX) capsule 0.4 mg, 0.4 mg, Oral, QPC supper, Bonnell Public, MD, 0.4 mg at 01/31/16 1752    Vital Signs: BP (!) 176/92   Pulse 100   Temp 99.3 F (37.4 C) (Oral)   Resp 16   Ht 5\' 10"  (1.778 m)   Wt 186 lb 4.6 oz (84.5 kg)   SpO2 99%   BMI 26.73 kg/m   Physical Exam A&O x 3 Lungs: CTA Heart: Tachy reg (L)chest skin site from previous tunneled PICC is clean  Imaging: No results found.  Labs:  CBC:  Recent Labs  01/28/16 1245 01/29/16 0440 01/31/16 0344 02/01/16 0450  WBC 11.3* 10.1 10.1 10.6*  HGB 7.3* 8.0* 7.7* 8.2*  HCT 21.9* 24.6* 23.5* 24.8*  PLT 215 264 239 222    COAGS:  Recent Labs  01/16/16 1801 01/17/16 0424  INR 1.27 1.28  APTT 35  --     BMP:  Recent Labs  01/29/16 0440 01/30/16 0400 01/31/16 0344 02/01/16 0450  NA 135  136 136 136 139  K 4.2  4.2 3.8 3.6 3.3*  CL 112*  112* 112* 111 110  CO2 16*  16* 15* 18* 21*  GLUCOSE 116*  117* 105* 115* 97  BUN 117*  117* 109* 101* 89*  CALCIUM 8.3*  8.3* 8.2* 8.4* 8.2*  CREATININE 5.46*  5.45* 4.96* 4.51* 4.04*  GFRNONAA 9*  9* 10* 11* 13*  GFRAA 10*  10* 12* 13* 15*    LIVER FUNCTION TESTS:  Recent Labs  08/29/15 1955 10/17/15 0845 11/07/15 1440 01/16/16 1239  01/29/16 0440 01/30/16 0400 01/31/16 0344 02/01/16 0450  BILITOT 0.4 0.6 0.4 0.7  --   --   --   --   --   AST 20 17 16 30   --   --   --   --   --   ALT 9* 6* 5* 12*  --   --   --   --   --   ALKPHOS 38 35* 39* 27*  --   --   --   --   --   PROT 7.3 7.6 7.8 7.6  --   --   --   --   --   ALBUMIN 4.3 4.6 4.4 4.2  < > 2.1* 2.0* 2.0* 1.9*  < > = values in this interval not displayed.  Assessment  and Plan: Bacteremia CKD Plan for placement of new (L)IJ tunneled PICC tomorrow. Procedure reviewed with pt, consent obtained.  Electronically Signed: Ascencion Dike 02/01/2016, 10:00 AM   I spent a total of 15 Minutes at the the patient's bedside AND on the patient's hospital floor or unit, greater than 50% of which was counseling/coordinating care for placement of  tunneled central venous catheter

## 2016-02-01 NOTE — Progress Notes (Signed)
Patient ID: [name]., male   DOB: [birth year], 80 y.o.   MRN: [id number] [facility] Progress Note   Assessment/ Plan:   1. AKI on CKD stage IV: Workup so far pointing towards prerenal state/evolution to ischemic ATN. Remains non-oliguric with slow improvement of creatinine/renal function. He has appropriately declined dialysis as an intervention in the future and was seen by palliative care team in this regard---fortunately, renal function appears to be turning around. 2. 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 Follow-up blood cultures have been negative to date. He is status post removal of pacemaker leads-procedure complicated by lead breakage and needing retrieval from right femoral vein by vascular surgery. 3. 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 He is status post XRT and Lupron for prostate cancer. 4. Myelodysplastic syndrome/anemia: hemoglobin low but stable, no overt loss. 5. Anion gap metabolic acidosis: Improved with ongoing sodium bicarbonate therapy along with gradual improvement of renal function/supportive care. Will stop bicarbonate drip today and transition to oral NaHCO3. 6. Hypokalemia: likely from trans-cellular shifting with ongoing bicarbonate infusion and limited oral intake. Stop bicarb drip today.   Subjective:   Denies any diarrhea, chest pain or shortness of breath. Still reports weakness.     Objective:   BP (!) 169/77   Pulse 92   Temp 98.9 F (37.2 C) (Oral)   Resp 16   Ht 5\' 10"  (1.778 m)   Wt 84.5 kg (186 lb 4.6 oz)   SpO2 99%   BMI 26.73 kg/m   Intake/Output Summary (Last 24 hours) at 02/01/16 [id number] Last data filed at 02/01/16 [id number]  Gross per 24 hour  Intake             1560 ml  Output             1975 ml  Net             -415 ml   Weight change:   Physical Exam: Gen: Comfortably resting in bed-in better spirits today  CVS:  Pulse regular, S1 and S2 normal without gallop or rub  Resp:Poor inspiratory effort with decreased breath sounds over bases Abd: Soft, obese, nontender  Ext: 2+ lower extremity edema   Imaging: No results found.  Labs: BMET  Recent Labs Lab 01/26/16 0513 01/27/16 0910 01/27/16 1854 01/27/16 2019 01/28/16 0536 01/29/16 0440 01/30/16 0400 01/31/16 0344 02/01/16 0450  NA 131* 133* 134* 136 135 135  136 136 136 139  K 4.5 4.2 4.4 4.5 4.1 4.2  4.2 3.8 3.6 3.3*  CL 102 106  --   --  109 112*  112* 112* 111 110  CO2 20* 18*  --   --  16* 16*  16* 15* 18* 21*  GLUCOSE 197* 179*  --   --  126* 116*  117* 105* 115* 97  BUN 123* 126*  --   --  115* 117*  117* 109* 101* 89*  CREATININE 5.71* 5.92*  --   --  5.53* 5.46*  5.45* 4.96* 4.51* 4.04*  CALCIUM 8.2* 8.7*  --   --  8.2* 8.3*  8.3* 8.2* 8.4* 8.2*  PHOS 4.1 4.7*  --   --  5.3* 5.2* 4.7* 4.5 3.9   CBC  Recent Labs Lab 01/28/16 1245 01/29/16 0440 01/31/16 0344 02/01/16 0450  WBC 11.3* 10.1 10.1 10.6*  HGB 7.3* 8.0* 7.7* 8.2*  HCT 21.9* 24.6* 23.5* 24.8*  MCV 94.0 94.6  93.6 92.5  PLT 215 264 239 222    Medications:    . sodium chloride   Intravenous Once  . amiodarone  100 mg Oral Daily  . atorvastatin  80 mg Oral QHS  . Calcifediol ER  30 mcg Oral QHS  . carvedilol  25 mg Oral BID WC  .  ceFAZolin (ANCEF) IV  1 g Intravenous Q24H  . Chlorhexidine Gluconate Cloth  6 each Topical Daily  . insulin aspart  0-5 Units Subcutaneous QHS  . insulin aspart  0-9 Units Subcutaneous TID WC  . insulin aspart  6 Units Subcutaneous TID WC  . insulin glargine  5 Units Subcutaneous Daily  . isosorbide mononitrate  60 mg Oral Daily  . multivitamin with minerals  1 tablet Oral q morning - 10a  . mupirocin ointment  1 application Nasal BID  . polyethylene glycol  34 g Oral BID   Followed by  . polyethylene glycol  17 g Oral Daily  . predniSONE  20 mg Oral Q breakfast  . rifampin  600 mg Oral Daily  . senna-docusate  2  tablet Oral BID  . tamsulosin  0.4 mg Oral QPC supper      Elmarie Shiley, MD 02/01/2016, 8:12 AM

## 2016-02-01 NOTE — Progress Notes (Signed)
Pt without complaint. Urine clear to red. Received 2 U PRBC. Hand irrigation as needed.   PE: NAD Abd - soft, NT, bladder not distended GU - urine clear in tube, dark in bag. No clots. Draining well.   A/P - hemorrhagic cystitis - hematuria - ongoing vs. Dissolving formed clots in bladder by urokinase - continue prn irrigation. Foley draining well.

## 2016-02-01 NOTE — Progress Notes (Signed)
Daily Progress Note   Patient Name: [name] BAHL Sr.       Date: 02/01/2016 DOB: Feb 20, 1936  Age: 80 y.o. MRN#: [id number] Attending Physician: Albertine Patricia, MD Primary Care Physician: [name], MD Admit Date: 01/16/2016  Reason for Consultation/Follow-up: Establishing goals of care  Subjective: I met today with patient and his family.  He continues to report feeling very weak but also states that his spirits are better than they were couple of days ago. He had the opportunity to talk with nephrology this morning and is happy to hear that his kidney function is stable and appears to be beginning to improve.  His family reports they remain invested in plan to continue with current therapy and are hopeful that he continues to improve. At the same time, they seem to understand the severity of his illness and if he were to decompensate, both he and his family report that they do not want to pursue aggressive measures that may prolong suffering.  He is a [employer] manager and he and his family report, "know he is going someplace better when the time comes."   Length of Stay: 16  Current Medications: Scheduled Meds:  . sodium chloride   Intravenous Once  . amiodarone  100 mg Oral Daily  . atorvastatin  80 mg Oral QHS  . Calcifediol ER  30 mcg Oral QHS  . carvedilol  25 mg Oral BID WC  .  ceFAZolin (ANCEF) IV  1 g Intravenous Q24H  . Chlorhexidine Gluconate Cloth  6 each Topical Daily  . feeding supplement (NEPRO CARB STEADY)  237 mL Oral TID WC  . hydrALAZINE  25 mg Oral Q8H  . insulin aspart  0-5 Units Subcutaneous QHS  . insulin aspart  0-9 Units Subcutaneous TID WC  . insulin aspart  6 Units Subcutaneous TID WC  . insulin glargine  5 Units Subcutaneous Daily  . isosorbide  mononitrate  60 mg Oral Daily  . multivitamin with minerals  1 tablet Oral q morning - 10a  . mupirocin ointment  1 application Nasal BID  . polyethylene glycol  34 g Oral BID   Followed by  . polyethylene glycol  17 g Oral Daily  . predniSONE  20 mg Oral Q breakfast  . rifampin  600 mg Oral Daily  . senna-docusate  2 tablet Oral BID  . sodium bicarbonate  650 mg Oral BID  . tamsulosin  0.4 mg Oral QPC supper    Continuous Infusions: . norepinephrine (LEVOPHED) Adult infusion Stopped (01/28/16 1815)  . sodium chloride irrigation      PRN Meds: acetaminophen, acetaminophen, acetaminophen, hydrALAZINE, ondansetron (ZOFRAN) IV, ondansetron (ZOFRAN) IV, oxyCODONE-acetaminophen, sodium chloride flush  Physical Exam         General: Alert, awake, in no acute distress.  Heart: Regular rate and rhythm. No murmur appreciated. Lungs: Fair air movement, clear Abdomen: Soft, nontender, nondistended, positive bowel sounds.  Ext: 2+ edema Skin: Warm and dry Neuro: Grossly intact, nonfocal.  Vital Signs: BP (!) 176/92   Pulse 100   Temp 99.3 F (37.4 C) (Oral)   Resp 16   Ht '5\' 10"'$  (1.778 m)   Wt 84.5 kg (186 lb 4.6 oz)   SpO2 99%   BMI 26.73 kg/m  SpO2: SpO2: 99 % O2 Device: O2 Device: Not Delivered O2 Flow Rate: O2 Flow Rate (L/min): 1 L/min  Intake/output summary:  Intake/Output Summary (Last 24 hours) at 02/01/16 0931 Last data filed at 02/01/16 8676  Gross per 24 hour  Intake             1560 ml  Output             1975 ml  Net             -415 ml   LBM: Last BM Date: 01/31/16 Baseline Weight: Weight: 70.8 kg (156 lb 1.4 oz) Most recent weight: Weight: 84.5 kg (186 lb 4.6 oz)       Palliative Assessment/Data: 30%   Flowsheet Rows   Flowsheet Row Most Recent Value  Intake Tab  Referral Department  Hospitalist  Unit at Time of Referral  Cardiac/Telemetry Unit  Palliative Care Primary Diagnosis  Cardiac  Date Notified  01/27/16  Palliative Care Type  Return  patient Palliative Care  Reason for referral  Clarify Goals of Care  Date of Admission  01/16/16  Date first seen by Palliative Care  01/28/16  # of days Palliative referral response time  1 Day(s)  # of days IP prior to Palliative referral  11  Clinical Assessment  Palliative Performance Scale Score  40%  Pain Max last 24 hours  4  Pain Min Last 24 hours  0  Psychosocial & Spiritual Assessment  Palliative Care Outcomes  Patient/Family meeting held?  Yes  Who was at the meeting?  Patient, his wife, multiple family members (>10) including children, grandchildren, nieces, nephews, and several other members of family via phone  Palliative Care Outcomes  Clarified goals of care, Changed CPR status      Patient Active Problem List   Diagnosis Date Noted  . Arterial hypotension   . Pacemaker infection ([place])   . Acute renal failure superimposed on stage 4 chronic kidney disease ([place]) 01/26/2016  . Bacteremia due to Staphylococcus aureus   . Pseudogout involving multiple joints 01/24/2016  . Bladder mass 01/24/2016  . Hemoptysis   . Pain   . Wrist swelling   . Staphylococcus aureus bacteremia 01/17/2016  . CAP (community acquired pneumonia) 01/16/2016  . S/P hernia repair 10/28/2015  . Right inguinal hernia 09/09/2015  . GIB (gastrointestinal bleeding) 08/29/2015  . Hematuria 05/19/2015  . Thrombocytopenia ([place]) 11/11/2014  . Leukopenia 11/11/2014  . B12 deficiency 08/31/2013  . Pacemaker 08/26/2013  . On amiodarone therapy 08/21/2013  . Gout due to renal impairment, left  ankle and foot 06/13/2013    Class: Acute  . CAD of autologous bypass graft 06/05/2013  . MDS (myelodysplastic syndrome) ([place]) 05/22/2013  . Anemia of chronic disease 03/19/2013  . CKD (chronic kidney disease), stage IV ([place]) 03/18/2013  . NSVT (nonsustained ventricular tachycardia) ([place]) 03/18/2013  . Chronic diastolic heart failure ([place]) 03/17/2013  . Diabetes mellitus ([place]) 03/17/2013  . Essential  hypertension 03/17/2013  . Mesenteric artery stenosis ([place]) 05/01/2012  . PAF (paroxysmal atrial fibrillation) ([place]) 12/24/2011  . PAD (peripheral artery disease) ([place]) 12/24/2011  . Chronic vascular insufficiency of intestine ([facility]) 12/20/2011    Palliative Care Assessment & Plan   Patient Profile: 80 y.o. male  with past medical history of PMH of paroxysmal atrial fibrillation/pacemaker not on anticoagulation secondary to history of GI bleed, CAD, stage IV CKD, CAD, hypertension,Liddle's syndrome, myelodysplastic syndrome, PVD and hyperlipidemia admitted on 01/16/2016 with MMSA bacteremia (status post wrist arthropathy as well as pacemaker removal), hematuria, myelodysplastic syndrome/anemia of chronic disease, and acute kidney injury on chronic kidney disease.   Recommendations/Plan:  I met with family again today. We discussed his clinical course over the last 48 hours since our last meeting. While he remains critically ill with high risk of continued decompensation, he has had some improvement in his renal function over the last 48 hours.  We discussed need to continue antibiotics through August 3 per recommendation of ID.  His family remains invested in current plan to continue with current therapy as it appears he is having some response to this therapy. We again discussed the uncertainty of the future, and he continues to "put things in God's hands." No aggressive measures in the event of cardiac or respiratory arrest. His also stated that he is not interested in dialysis nor having a feeding tube in any point in the future.  Goals of Care and Additional Recommendations:  Limitations on Scope of Treatment: Continue with current therapy. No aggressive measures in the event of cardiac or respiratory arrest. He is not interested in dialysis or feeding tube.  Code Status:    Code Status Orders        Start     Ordered   01/29/16 1735  Do not attempt resuscitation (DNR)  Continuous     Question Answer Comment  In the event of cardiac or respiratory ARREST Do not call a "code blue"   In the event of cardiac or respiratory ARREST Do not perform Intubation, CPR, defibrillation or ACLS   In the event of cardiac or respiratory ARREST Use medication by any route, position, wound care, and other measures to relive pain and suffering. May use oxygen, suction and manual treatment of airway obstruction as needed for comfort.      01/29/16 1734    Code Status History    Date Active Date Inactive Code Status Order ID Comments User Context   01/27/2016  9:30 PM 01/29/2016  5:34 PM Full Code [id number]  Evans Lance, MD Inpatient   01/16/2016  5:14 PM 01/27/2016  9:30 PM Full Code [id number]  Bonnell Public, MD Inpatient   08/30/2015 12:33 AM 08/31/2015  5:14 PM Full Code [id number]  Edwin Dada, MD Inpatient   06/05/2013  4:19 AM 06/14/2013  5:41 PM Full Code [id number]  Charlton Haws, MD Inpatient   03/16/2013  9:54 PM 03/20/2013  6:18 PM Full Code [id number]  Theressa Millard, MD ED   01/06/2012 11:43 AM 01/09/2012  2:51 PM Full Code [id number]  Orvan July,  RN Inpatient       Prognosis:   Unable to determine  Discharge Planning:  To Be Determined. Likely skilled facility for continuation of antibiotics. Recommend palliative care follow-up at SNF.  Care plan was discussed with patient, family, Dr. Waldron Labs  Thank you for allowing the Palliative Medicine Team to assist in the care of this patient.   Time In: 1500 Time Out: 1530 Total Time 30 Prolonged Time Billed No      Greater than 50%  of this time was spent counseling and coordinating care related to the above assessment and plan.  Micheline Rough, MD  Please contact Palliative Medicine Team phone at [phone] for questions and concerns.

## 2016-02-02 ENCOUNTER — Encounter (HOSPITAL_COMMUNITY): Payer: Self-pay | Admitting: Diagnostic Radiology

## 2016-02-02 ENCOUNTER — Inpatient Hospital Stay (HOSPITAL_COMMUNITY): Payer: Medicare Other

## 2016-02-02 DIAGNOSIS — M1180 Other specified crystal arthropathies, unspecified site: Secondary | ICD-10-CM

## 2016-02-02 HISTORY — PX: IR GENERIC HISTORICAL: IMG1180011

## 2016-02-02 LAB — RENAL FUNCTION PANEL
ALBUMIN: 2 g/dL — AB (ref 3.5–5.0)
ANION GAP: 7 (ref 5–15)
BUN: 82 mg/dL — ABNORMAL HIGH (ref 6–20)
CALCIUM: 8.1 mg/dL — AB (ref 8.9–10.3)
CO2: 23 mmol/L (ref 22–32)
Chloride: 109 mmol/L (ref 101–111)
Creatinine, Ser: 3.73 mg/dL — ABNORMAL HIGH (ref 0.61–1.24)
GFR calc non Af Amer: 14 mL/min — ABNORMAL LOW (ref >60)
GFR, EST AFRICAN AMERICAN: 17 mL/min — AB (ref >60)
Glucose, Bld: 145 mg/dL — ABNORMAL HIGH (ref 65–99)
PHOSPHORUS: 3.5 mg/dL (ref 2.5–4.6)
Potassium: 3.3 mmol/L — ABNORMAL LOW (ref 3.5–5.1)
SODIUM: 139 mmol/L (ref 135–145)

## 2016-02-02 LAB — GLUCOSE, CAPILLARY
Glucose-Capillary: 149 mg/dL — ABNORMAL HIGH (ref 65–99)
Glucose-Capillary: 156 mg/dL — ABNORMAL HIGH (ref 65–99)
Glucose-Capillary: 156 mg/dL — ABNORMAL HIGH (ref 65–99)
Glucose-Capillary: 188 mg/dL — ABNORMAL HIGH (ref 65–99)

## 2016-02-02 LAB — MAGNESIUM: Magnesium: 1.8 mg/dL (ref 1.7–2.4)

## 2016-02-02 MED ORDER — FENTANYL CITRATE (PF) 100 MCG/2ML IJ SOLN
INTRAMUSCULAR | Status: AC | PRN
  Administered 2016-02-02 (×4): 25 ug via INTRAVENOUS

## 2016-02-02 MED ORDER — METHYLPREDNISOLONE SODIUM SUCC 125 MG IJ SOLR
80.0000 mg | Freq: Once | INTRAMUSCULAR | Status: AC
  Administered 2016-02-02: 80 mg via INTRAVENOUS
  Filled 2016-02-02: qty 2

## 2016-02-02 MED ORDER — FENTANYL CITRATE (PF) 100 MCG/2ML IJ SOLN
INTRAMUSCULAR | Status: AC
  Filled 2016-02-02: qty 2

## 2016-02-02 MED ORDER — MIDAZOLAM HCL 2 MG/2ML IJ SOLN
INTRAMUSCULAR | Status: AC | PRN
  Administered 2016-02-02 (×3): 1 mg via INTRAVENOUS

## 2016-02-02 MED ORDER — LIDOCAINE HCL 1 % IJ SOLN
INTRAMUSCULAR | Status: AC
  Administered 2016-02-02: 20 mL
  Filled 2016-02-02: qty 20

## 2016-02-02 MED ORDER — PREDNISONE 20 MG PO TABS
40.0000 mg | ORAL_TABLET | Freq: Every day | ORAL | Status: DC
  Administered 2016-02-03: 40 mg via ORAL
  Filled 2016-02-02: qty 2

## 2016-02-02 MED ORDER — MIDAZOLAM HCL 2 MG/2ML IJ SOLN
INTRAMUSCULAR | Status: AC
  Filled 2016-02-02: qty 4

## 2016-02-02 MED ORDER — COLCHICINE 0.6 MG PO TABS
0.6000 mg | ORAL_TABLET | Freq: Once | ORAL | Status: AC
  Administered 2016-02-02: 0.6 mg via ORAL
  Filled 2016-02-02: qty 1

## 2016-02-02 MED ORDER — POTASSIUM CHLORIDE CRYS ER 20 MEQ PO TBCR
40.0000 meq | EXTENDED_RELEASE_TABLET | Freq: Once | ORAL | Status: AC
  Administered 2016-02-02: 40 meq via ORAL
  Filled 2016-02-02: qty 2

## 2016-02-02 MED ORDER — CEFAZOLIN SODIUM-DEXTROSE 2-4 GM/100ML-% IV SOLN
INTRAVENOUS | Status: AC
  Filled 2016-02-02: qty 100

## 2016-02-02 MED ORDER — CLONIDINE HCL 0.1 MG PO TABS
0.1000 mg | ORAL_TABLET | Freq: Two times a day (BID) | ORAL | Status: DC
  Administered 2016-02-02 (×2): 0.1 mg via ORAL
  Filled 2016-02-02 (×2): qty 1

## 2016-02-02 NOTE — Sedation Documentation (Signed)
MD has spoken with pt and family, all in agreement to proceed with procedure.

## 2016-02-02 NOTE — Sedation Documentation (Signed)
Patient is resting comfortably. 

## 2016-02-02 NOTE — Progress Notes (Signed)
    [facility] for Infectious Disease   Reason for visit: Follow up on pacemaker lead vegetation with MSSA bacteremia  Interval History: repeated blood cultures remain negative; afebrile.  Palliative care discussion done.  Improved renal function.   Physical Exam: Constitutional:  Vitals:   02/02/16 0517 02/02/16 0700  BP: (!) 161/70 (!) 197/103  Pulse:  (!) 106  Resp:  15  Temp:  99.7 F (37.6 C)   patient appears in NAD Respiratory: Normal respiratory effort; CTA B Cardiovascular: RRR  Review of Systems: Constitutional: negative for fevers and chills Cardiovascular: negative for chest pain Gastrointestinal: negative for diarrhea  Lab Results  Component Value Date   WBC 10.6 (H) 02/01/2016   HGB 8.2 (L) 02/01/2016   HCT 24.8 (L) 02/01/2016   MCV 92.5 02/01/2016   PLT 222 02/01/2016    Lab Results  Component Value Date   CREATININE 3.73 (H) 02/02/2016   BUN 82 (H) 02/02/2016   NA 139 02/02/2016   K 3.3 (L) 02/02/2016   CL 109 02/02/2016   CO2 23 02/02/2016    Lab Results  Component Value Date   ALT 12 (L) 01/16/2016   AST 30 01/16/2016   ALKPHOS 27 (L) 01/16/2016     Microbiology: Recent Results (from the past 240 hour(s))  Surgical pcr screen     Status: Abnormal   Collection Time: 01/27/16  1:12 PM  Result Value Ref Range Status   MRSA, PCR NEGATIVE NEGATIVE Final   Staphylococcus aureus POSITIVE (A) NEGATIVE Final    Comment:        The Xpert SA Assay (FDA approved for NASAL specimens in patients over 15 years of age), is one component of a comprehensive surveillance program.  Test performance has been validated by [facility] for patients greater than or equal to 33 year old. It is not intended to diagnose infection nor to guide or monitor treatment.     Impression/Plan:  1. Pacemaker vegetation - removed.  I agree with replacement on or after 7/22.  Cefazolin and rifampin.  2. Bacteremia - from #1.  Cleared blood cultures.  Will need  IV cefazolin through 02/12/2016.  Picc line today, not going to do dialysis.    No changes.  I will sign off, please call with questions.  thanks

## 2016-02-02 NOTE — Progress Notes (Signed)
CSW received call from MD that pt likely ready next 1-2 days  CSW confirmed with [facility] they are able to accept patient- they will follow up with wife for paperwork  CSW will continue to follow  Domenica Reamer, [place] Worker [phone]

## 2016-02-02 NOTE — Procedures (Signed)
Placement of left jugular tunneled central line. Tip at SVC/RA junction.  Technically difficult procedure due to vessel tortuosity.  No immediate complication.  Minimal blood loss.

## 2016-02-02 NOTE — Progress Notes (Signed)
Subjective: Interval History: has complaints wrist, leg gout.  Objective: Vital signs in last 24 hours: Temp:  [97.9 F (36.6 C)-100.2 F (37.9 C)] 99.7 F (37.6 C) (07/24 0412) Pulse Rate:  [79-100] 79 (07/24 0009) Resp:  [10-18] 12 (07/24 0009) BP: (118-183)/(69-94) 161/70 (07/24 0517) SpO2:  [98 %-100 %] 98 % (07/24 0009) Weight:  [82.2 kg (181 lb 3.5 oz)] 82.2 kg (181 lb 3.5 oz) (07/24 0412) Weight change:   Intake/Output from previous day: 07/23 0701 - 07/24 0700 In: 995 [P.O.:840; IV Piggyback:50] Out: 1200 [Urine:1200] Intake/Output this shift: No intake/output data recorded.  General appearance: cooperative, moderately obese and slowed mentation Resp: diminished breath sounds bilaterally Chest wall: bandages, both [place] areas Cardio: S1, S2 normal and systolic murmur: holosystolic 2/6, blowing at apex GI: pos bs, liver down 5 cm Pelvic: mistake  Lab Results:  Recent Labs  01/31/16 0344 02/01/16 0450  WBC 10.1 10.6*  HGB 7.7* 8.2*  HCT 23.5* 24.8*  PLT 239 222   BMET:  Recent Labs  02/01/16 0450 02/02/16 0553  NA 139 139  K 3.3* 3.3*  CL 110 109  CO2 21* 23  GLUCOSE 97 145*  BUN 89* 82*  CREATININE 4.04* 3.73*  CALCIUM 8.2* 8.1*   No results for input(s): PTH in the last 72 hours. Iron Studies: No results for input(s): IRON, TIBC, TRANSFERRIN, FERRITIN in the last 72 hours.  Studies/Results: No results found.  I have reviewed the patient's current medications.  Assessment/Plan: 1 CKD 4-5. AKI . AKI resolving. Vol xs mild, diuresing on his own.  Acid/base/K ok 2 Anemia  3 MSSA sepsis on Ancef 4 Pacer infx 5 Gout on Pred 6 Debill P will follow 1-2 more d, little to offer. Cont AB    LOS: 17 days   Douglas Rooks L 02/02/2016,7:27 AM

## 2016-02-02 NOTE — Progress Notes (Addendum)
Progress Note    [name]  QHS:[id number] DOB: 12/15/1935  DOA: 01/16/2016 PCP: [name], MD    Brief Narrative:   [name] Durnil. is an 80 y.o. male with a PMH of paroxysmal atrial fibrillation/pacemaker not on anticoagulation secondary to history of GI bleed, CAD, stage IV CKD, CAD, hypertension,Liddle's syndrome, myelodysplastic syndrome, PVD and hyperlipidemia who was admitted 01/16/16 with multifocal pneumonia complicated by coagulopathy and hematuria. Blood cultures were positive for MSSA. Patient subsequently developed severe wrist pain and underwent arthrotomy of left wrist joint 01/21/16. Pseudogout suspected given negative synovial fluid cultures. Echocardiogram done which showed echodensity on RA lead, s/p PPM system extraction 7/18 by Dr. Lovena Le (Required vascular surgery to retrieve RV lead tip from RFV)  Assessment/Plan:   Staphylococcus aureus bacteremia with Evidence of vegetation on on atrial pacing lead pacemaker , and  right lower lobe pneumonia/Multifocal pneumonia - Blood culture growing MSSA on 01/16/2016, blood cultures 01/17/16 with no growth to date - Antibiotic management per ID, currently on rifampin and Ancef to finish total of 4 weeks through 02/12/2016 - s/p PPM system extraction 7/18 by Dr. Lovena Le (Required vascular surgery to retrieve RV lead tip from RFV), Per ID recs its safe to replace a new pacemaker 14 days after the first negative blood culture But currently no plan by  EP to replace his pacemaker. - TEE done 01/26/16 with no evidence of vegetation,  - Palliative care consultation requested given overall failure to thrive, poor prognosis with progressive renal failure (does not want to consider HD and would likely not be a good candidate.   hypertension - With brief episodes of hypotension on 7/19, required brief pressor support, since the blood pressure has been gradually increasing, is groomed gradually on his previous meds, currently  back on Coreg full dose, Imdur full dose, on hydralazine, remains uncontrolled, will resume back on home dose clonidine .   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 5 cm masslike area consistent with a blood clot.  - Evaluated by urology with a cystoscopy performed, status post bladder clot evacuation and bladder irrigation. Management per urology, initially on CBI , remains with significant hematuria,started on foley flushes every 4 hours.  Anemia - Multifactorial, anemia of chronic kidney disease, and chronic blood loss in the setting of hematuria, required multiple blood transfusions, series 20 units PRBC yesterday, hemoglobin improved from 7.7-8.2   Coronary artery disease/chronic diastolic CHF  - Status post CABG. Aspirin/Plavix currently on hold secondary to hematuria and hemoptysis.  - Continue Lipitor,  - Resume Imdur and Coreg giving improving hypotension - No evidence of heart failure exacerbation. Diuretics currently on hold.  PAF (paroxysmal atrial fibrillation) (HCC) status post pacemaker for sick sinus syndrome - CHA2DS2Vasc is at least 4, but not felt to be a candidate for anticoagulation given history of GI bleeding. Continue amiodarone.  Acute renal failure superimposed on stage IV chronic kidney disease/renal artery stenosis - Baseline creatinine 3.8. Creatinine rising. Creatinine peaked at 0.9, most likely prerenal state/evolution of ischemic ATN, appears to be improving, bicarbonate infusion. Today. - Hypokalemia with potassium of 3.3, will replete.  Wrist swelling secondary to probable pseudogout/ gout in left foot. - Seen by orthopedics, status post incision and drainage 01/21/16. synovial Fluid cultures negative ,CPPD crystals noted, findings most consistent with pseudogout. Will follow-up with Dr. Grandville Silos. On prednisone , started to wean, started complaining again of wrist pain and left foot pain, will hold  on weaning, will give one dose of IV Solu-Medrol today, will give one dose of colchicine as well..  Liddle's syndrome - Monitor for hypokalemia and high blood pressure.  Myelodysplastic syndrome/anemia of chronic disease - Gets Epogen injections every 2 weeks at the cancer center, last use as needed.  Diabetes mellitus with peripheral artery disease - Usually diet-controlled at home, continue with insulin sliding scale,  and 6 units NovoLog before meals , overall acceptable, will discontinue low-dose Lantus started yesterday.  Gastroparesis  Prostate cancer Gets Lupron injections every 4 months. S/P external beam radiation.  Protein calorie malnutrition - We'll start on Nepro.   Family Communication/Anticipated D/C date and plan/Code Status   DVT prophylaxis: SCDs ordered. Code Status: DNR Family Communication Son and wife at bedside Disposition Plan:  In 1-2 days  to SNF   Medical Consultants:    Cardiology  Orthopedic Surgery  Infectious Disease  Nephrology  Urology  PCCM  Palliative    Procedures:   - 1 unit PRBC transfusion on 7/17, and 1 unit on 7/18, and 1 unit on 7/22 - s/p PPM system extraction 7/18 by Dr. Lovena Le (Required vascular surgery to retrieve RV lead tip from RFV) - IR service for placement of tunneled (L)IJ PICC on 7/16 (removed 7/32 and during complicated pacing system extraction on 7/18. - Right IJ TLC on 7/18   2 D Echo 01/24/16:  Dynamic obstruction with peak velocity of 351 cm/sec. no regional wall motion abnormalities. Grade 1 diastolic dysfunction. Mobile echodensity in the right atrium attached to pacer wire. Mild systolic pulmonary artery hypertension.  Dg Chest 2 View  Result Date: 01/16/2016 CLINICAL DATA:  Chest pain, cough for 2 days EXAM: CHEST  2 VIEW COMPARISON:  CT chest 04/03/2014 FINDINGS: Cardiomediastinal silhouette is unremarkable. Status post median sternotomy. Dual lead cardiac pacemaker with leads in right atrium and  right ventricle. There is streaky airspace opacification in right base infrahilar region and right upper lobe peripheral perihilar. Findings highly suspicious for pneumonia. Probable small right pleural effusion. Left lung is clear. IMPRESSION: There is streaky airspace opacification in right base infrahilar region and right upper lobe peripheral perihilar. Findings highly suspicious for pneumonia. Followup PA and lateral chest X-ray is recommended in 3-4 weeks following trial of antibiotic therapy to ensure resolution and exclude underlying malignancy. Probable small right pleural effusion. Electronically Signed   By: Lahoma Crocker M.D.   On: 01/16/2016 13:58   Dg Wrist 2 Views Left  Result Date: 01/18/2016 CLINICAL DATA:  Acute onset of left wrist pain.  Initial encounter. EXAM: LEFT WRIST - 2 VIEW COMPARISON:  None. FINDINGS: There is no evidence of fracture or dislocation. There is joint space narrowing along the radiocarpal joint, and at the radial aspect of the carpal rows. This resolves in mild chronic deformity of the scaphoid and lunate, with expansion of the scapholunate distance to 5 mm. Scattered vascular calcifications are seen. No additional soft tissue abnormalities are characterized on radiograph. IMPRESSION: 1. No evidence of fracture or dislocation. 2. Degenerative joint space narrowing along the radiocarpal joints, and at the radial aspect of the carpal rows. This results in mild chronic deformity of the scaphoid and lunate, with expansion of the scapholunate distance to 5 mm, reflecting scapholunate dissociation. Electronically Signed   By: Garald Balding M.D.   On: 01/18/2016 16:57   Ct Chest Wo Contrast  Result Date: 01/16/2016 CLINICAL DATA:  Hemoptysis, cough.  Symptoms since [name]. EXAM: CT CHEST WITHOUT CONTRAST TECHNIQUE: TECHNIQUE Multidetector CT imaging  of the chest was performed following the standard protocol without IV contrast. COMPARISON:  Chest radiograph 01/16/2016, CT  04/03/2014 FINDINGS: Cardiovascular: Calcification of the thoracic aorta. Coronary bypass graft. Pacer wires in the RIGHT heart. No pericardial fluid. Mediastinum/Nodes: No axillary or supraclavicular adenopathy. No mediastinal hilar adenopathy. Esophagus normal. Lungs/Pleura: There is consolidative airspace disease in the RIGHT lower lobe with air bronchograms. A similar pattern in the posterior aspect the RIGHT upper lobe. Smaller pattern of consolidation in the LEFT lower lobe. Findings are consistent with multifocal pneumonia. Upper Abdomen: Limited view of the liver, kidneys, pancreas are unremarkable. Normal adrenal glands. Musculoskeletal: No aggressive osseous lesion.  Midline sternotomy. IMPRESSION: 1. Multifocal pneumonia with most dense infection in RIGHT lower lobe. 2. Coronary artery calcification and aortic atherosclerotic calcification. Electronically Signed   By: Suzy Bouchard M.D.   On: 01/16/2016 16:32   US Renal  Result Date: 01/17/2016 CLINICAL DATA:  80 year old male with microscopic hematuria. Personal history of prostate cancer. Initial encounter. EXAM: RENAL / URINARY TRACT ULTRASOUND COMPLETE COMPARISON:  CT Abdomen and Pelvis 08/30/2015, and earlier. Right upper quadrant ultrasound from today reported separately. FINDINGS: Right Kidney: Length: 12.3 cm. Echogenicity within normal limits. No mass or hydronephrosis visualized. Left Kidney: Length: 12.0 cm. Echogenicity within normal limits. No mass or hydronephrosis visualized. Bladder: 5 cm area which is dependent, complex, and mass like (image 15), however, there is no internal vascularity detected with brief color Doppler interrogation. Elsewhere the urinary bladder appears normal. Prostate measured to be 5.2 x 3.1 x 4.3 cm. IMPRESSION: 1. Abnormal 5 cm complex, mass-like area of dependent echogenicity in the urinary bladder. However, lack of internal vascularity on Doppler suggests this is more likely blood clot rather than a  bladder tumor. Further evaluation recommended. 2. Normal for age sonographic appearance of both kidneys. Electronically Signed   By: Genevie Ann M.D.   On: 01/17/2016 08:01   US Renal  Result Date: 01/09/2016 CLINICAL DATA:  Two weeks of hematuria ; history of prostate enlargement EXAM: RENAL / URINARY TRACT ULTRASOUND COMPLETE COMPARISON:  Abdominal pelvic CT scan of July 30, 2015 FINDINGS: Right Kidney: Length: 11.5 cm. The renal cortical echotexture is equal to or slightly greater than that of the adjacent liver. There is no hydronephrosis nor discrete mass. Left Kidney: Length: 10.3 cm. The renal cortical echotexture is mildly increased similar to that on the right. There is no hydronephrosis. Bladder: There is an irregular mass in the posterior aspect of the urinary bladder wall measuring 2.5 x 2 x 4.4 cm. This appears separate from the more anteriorly and inferiorly positioned prostate gland. The prostate gland itself does produce a mild impression upon the urinary bladder base. IMPRESSION: 1. Posterior bladder wall mass worrisome for malignancy. This is separate from the mildly enlarged prostate gland. 2. Increased renal cortical echotexture bilaterally consistent with medical renal disease. There is no hydronephrosis. Electronically Signed   By: David  Martinique M.D.   On: 01/09/2016 14:44   Ir Fluoro Guide Cv Line Left  Result Date: 01/25/2016 INDICATION: Bacteremia due to Staph aureus infection. History of chronic renal insufficiency. Request made for placement of a tunneled PICC line for antibiotic administration. EXAM: TUNNELED PICC LINE WITH ULTRASOUND AND FLUOROSCOPIC GUIDANCE MEDICATIONS: The patient is currently admitted to the hospital and receiving intravenous antibiotics. The antibiotic was given in an appropriate time interval prior to skin puncture. ANESTHESIA/SEDATION: Moderate (conscious) sedation was employed during this procedure. A total of Versed 0.5 mg and Fentanyl 25 mcg was  administered intravenously. Moderate Sedation Time: 33 minutes. The patient's level of consciousness and vital signs were monitored continuously by radiology nursing throughout the procedure under my direct supervision. COMPLICATIONS: None immediate. PROCEDURE: Informed written consent was obtained from the patient after a discussion of the risks, benefits, and alternatives to treatment. Questions regarding the procedure were encouraged and answered. Given the presence of the right anterior chest wall pacemaker, the decision was made to place a left internal jugular approach dual lumen PICC line. As such, the left neck and chest were prepped with chlorhexidine in a sterile fashion, and a sterile drape was applied covering the operative field. Maximum barrier sterile technique with sterile gowns and gloves were used for the procedure. A timeout was performed prior to the initiation of the procedure. After creating a small venotomy incision, a micropuncture kit was utilized to access the left internal jugular vein under direct, real-time ultrasound guidance after the overlying soft tissues were anesthetized with 1% lidocaine with epinephrine. Ultrasound image documentation was performed. Despite several attempts, there was difficulty advancing the micro wire centrally. As such, a limited central venogram was performed via the inner 3 French sheath from the micropuncture kit demonstrating patency though marked tortuosity of the central aspect of the left internal jugular vein. Ultimately, a Nitrex wire was able to be advanced centrally. The microwire was kinked to measure appropriate catheter length. The micropuncture sheath was exchanged for a peel-away sheath over a guidewire. Initially, a 5 [place] dual lumen tunneled PICC measuring 28 cm was tunneled in a retrograde fashion from the anterior chest wall to the venotomy incision. Unfortunately, the initial catheter was noted to retract within the tortuous left  innominate vein with with tip terminating at the level of the SVC. As such, the existing PICC line was cannulated with a GT Glidewire which was advanced to the level of the IVC. Under intermittent fluoroscopic guidance, the existing 28 cm tunneled PICC line was exchanged for a new slightly longer 31 cm dual lumen tunneled PICC line with tip ultimately terminating within the superior aspect the right atrium. Final catheter positioning was confirmed and documented with a spot radiographic image. The catheter aspirates and flushes normally. The catheter was flushed with appropriate volume heparin dwells. The catheter exit site was secured with a 0-Prolene retention suture. The venotomy incision was closed with Dermabond and Steri-strips. Dressings were applied. The patient tolerated the procedure well without immediate post procedural complication. FINDINGS: After catheter placement, the tip lies within the superior cavoatrial junction. The catheter aspirates and flushes normally and is ready for immediate use. IMPRESSION: Successful placement of 31 cm dual lumen tunneled PICC catheter via the left internal jugular vein with tip terminating at the superior caval atrial junction. The catheter is ready for immediate use. Electronically Signed   By: Sandi Mariscal M.D.   On: 01/25/2016 13:51   Ir US Guide Vasc Access Left  Result Date: 01/25/2016 INDICATION: Bacteremia due to Staph aureus infection. History of chronic renal insufficiency. Request made for placement of a tunneled PICC line for antibiotic administration. EXAM: TUNNELED PICC LINE WITH ULTRASOUND AND FLUOROSCOPIC GUIDANCE MEDICATIONS: The patient is currently admitted to the hospital and receiving intravenous antibiotics. The antibiotic was given in an appropriate time interval prior to skin puncture. ANESTHESIA/SEDATION: Moderate (conscious) sedation was employed during this procedure. A total of Versed 0.5 mg and Fentanyl 25 mcg was administered  intravenously. Moderate Sedation Time: 33 minutes. The patient's level of consciousness and vital signs were monitored continuously by  radiology nursing throughout the procedure under my direct supervision. COMPLICATIONS: None immediate. PROCEDURE: Informed written consent was obtained from the patient after a discussion of the risks, benefits, and alternatives to treatment. Questions regarding the procedure were encouraged and answered. Given the presence of the right anterior chest wall pacemaker, the decision was made to place a left internal jugular approach dual lumen PICC line. As such, the left neck and chest were prepped with chlorhexidine in a sterile fashion, and a sterile drape was applied covering the operative field. Maximum barrier sterile technique with sterile gowns and gloves were used for the procedure. A timeout was performed prior to the initiation of the procedure. After creating a small venotomy incision, a micropuncture kit was utilized to access the left internal jugular vein under direct, real-time ultrasound guidance after the overlying soft tissues were anesthetized with 1% lidocaine with epinephrine. Ultrasound image documentation was performed. Despite several attempts, there was difficulty advancing the micro wire centrally. As such, a limited central venogram was performed via the inner 3 French sheath from the micropuncture kit demonstrating patency though marked tortuosity of the central aspect of the left internal jugular vein. Ultimately, a Nitrex wire was able to be advanced centrally. The microwire was kinked to measure appropriate catheter length. The micropuncture sheath was exchanged for a peel-away sheath over a guidewire. Initially, a 5 [place] dual lumen tunneled PICC measuring 28 cm was tunneled in a retrograde fashion from the anterior chest wall to the venotomy incision. Unfortunately, the initial catheter was noted to retract within the tortuous left innominate vein with  with tip terminating at the level of the SVC. As such, the existing PICC line was cannulated with a GT Glidewire which was advanced to the level of the IVC. Under intermittent fluoroscopic guidance, the existing 28 cm tunneled PICC line was exchanged for a new slightly longer 31 cm dual lumen tunneled PICC line with tip ultimately terminating within the superior aspect the right atrium. Final catheter positioning was confirmed and documented with a spot radiographic image. The catheter aspirates and flushes normally. The catheter was flushed with appropriate volume heparin dwells. The catheter exit site was secured with a 0-Prolene retention suture. The venotomy incision was closed with Dermabond and Steri-strips. Dressings were applied. The patient tolerated the procedure well without immediate post procedural complication. FINDINGS: After catheter placement, the tip lies within the superior cavoatrial junction. The catheter aspirates and flushes normally and is ready for immediate use. IMPRESSION: Successful placement of 31 cm dual lumen tunneled PICC catheter via the left internal jugular vein with tip terminating at the superior caval atrial junction. The catheter is ready for immediate use. Electronically Signed   By: Sandi Mariscal M.D.   On: 01/25/2016 13:51   Dg Chest Port 1 View  Result Date: 01/28/2016 CLINICAL DATA:  Weakness, infected pacemaker EXAM: PORTABLE CHEST 1 VIEW COMPARISON:  Portable chest x-ray of January 27, 2016 FINDINGS: The left lung is well-expanded and clear. On the right there is mild volume loss with increased density in the upper and lower lobes which is stable. There is no pneumothorax. There is biapical pleural thickening the heart is normal in size. There is dense calcification in the aortic arch. The sternal wires are intact. The right internal jugular venous catheter tip projects over the distal third of the SVC just above the cavoatrial junction. IMPRESSION: 1. Persistent  infiltrate in the right upper and lower lobes. Small right pleural effusion. No definite pneumothorax. 2. The left lung  is clear.  No CHF.  Aortic atherosclerosis. Electronically Signed   By: David  Martinique M.D.   On: 01/28/2016 07:11   Dg Chest Portable 1 View  Result Date: 01/27/2016 CLINICAL DATA:  Right IJ placement EXAM: PORTABLE CHEST 1 VIEW COMPARISON:  January 16, 2016 FINDINGS: A right IJ has been placed in the interval, probably terminating just within the right side of the atrium, approximately 2 cm inferior to the caval atrial junction. No pneumothorax. Opacity remains in the right mid lung, less focal in the interval. Opacity and effusion remains in the left lung base, also less focal in the interval. No other interval changes. IMPRESSION: 1. The distal tip of the new right IJ probably terminates approximately 2 cm below the caval atrial junction, just within the right side of the atrium. Recommend repositioning as clinically warranted. No pneumothorax. 2. The right-sided pulmonary infiltrates are less focal in the interval but remain. There is probably a small amount of layering effusion on the right as well. Electronically Signed   By: Dorise Bullion III M.D   On: 01/27/2016 19:55   Dg Cyndy Freeze Guided Needle Plc Aspiration/injection Loc  Result Date: 01/21/2016 CLINICAL DATA:  Left wrist pain and swelling.  Sepsis. EXAM: LEFT WRIST ASPIRATION UNDER FLUOROSCOPY FLUOROSCOPY TIME:  Fluoroscopy Time (in minutes and seconds): 24 SECONDS PROCEDURE: Overlying skin prepped with Betadine, draped in the usual sterile fashion, and infiltrated locally with buffered Lidocaine. 20 gauge needle advanced into the radiocarpal joint under direct fluoroscopic visualization. Approximately 3 cc of bloody fluid was aspirated from the joint and overlying soft tissues. IMPRESSION: Left radiocarpal joint aspiration performed without immediate complication. Mr. Pedrosa with tolerated the procedure well. Technically  successful  hip injection under fluoroscopy. Electronically Signed   By: Lorriane Shire M.D.   On: 01/21/2016 08:10   US Abdomen Limited Ruq  Result Date: 01/17/2016 CLINICAL DATA:  80 year old male with abnormal LFTs. Prior cholecystectomy. Pneumonia. Initial encounter. EXAM: US ABDOMEN LIMITED - RIGHT UPPER QUADRANT COMPARISON:  Noncontrast chest CT 01/16/2016. CT Abdomen and Pelvis 08/30/2015 and earlier FINDINGS: Gallbladder: Surgically absent Common bile duct: Diameter: 6 mm, normal Liver: Chronic 2.6 cm left hepatic lobe cyst (image 18) does demonstrate mild wall thickening, however, this lesion is stable on multiple prior CTs back to 2011. Similar 3.1 cm right lobe cyst, also not significantly changed since 2011. Underlying hepatic echotexture is within normal limits. No intrahepatic biliary ductal dilatation is evident. No new liver lesion identified. Other findings: Negative visible right kidney. No free fluid identified. IMPRESSION: No acute liver or biliary findings. 2-3 cm liver cysts which are mildly thick walled but benign, having not significantly changed since 2011. Electronically Signed   By: Genevie Ann M.D.   On: 01/17/2016 07:56    Anti-Infectives:   Azithromycin 01/16/16---> 01/17/16 Rocephin 01/16/16---> 01/17/16 Ancef 01/17/16---> Rifampin 01/17/16 --->  Subjective:   [name] D Fahr Sr. continues to feel weak , Otherwise denies any complaints.    Objective:    Vitals:   02/02/16 1140 02/02/16 1147 02/02/16 1155 02/02/16 1311  BP: (!) 184/78  (!) 171/82 (!) 193/86  Pulse: (!) 103 (!) 102 (!) 102 (!) 102  Resp: _0 Temp:    (!) 100.5 F (38.1 C)  TempSrc:    Oral  SpO2: 99% 99% 96% 97%  Weight:      Height:        Intake/Output Summary (Last 24 hours) at 02/02/16 1605 Last data filed at 02/02/16 0900  Gross per 24 hour  Intake              220 ml  Output             1450 ml  Net            -1230 ml   Filed Weights   01/28/16 0700 01/30/16 1238 02/02/16 0412    Weight: 76.7 kg (169 lb) 84.5 kg (186 lb 4.6 oz) 82.2 kg (181 lb 3.5 oz)    Exam: General exam: Weak,Laying in bed Respiratory system: Clear to auscultation. Respiratory effort normal. Cardiovascular system: RRR, No JVD,  rubs, gallops or clicks. Grade 2/6 SEM. Gastrointestinal system: Abdomen is nondistended, soft and nontender. Normal bowel sounds heard. Central nervous system: Awake, communicative, appropriate. No focal neurological deficits. Extremities: Left hand markedly erythematous/swollen and tender with limited range of motion of fingers. Skin: No rashes, lesions or ulcers Psychiatry: Judgement and insight appear normal. Mood & affect appropriate.   Data Reviewed:   I have personally reviewed following labs and imaging studies:  Labs: Basic Metabolic Panel:  Recent Labs Lab 01/29/16 0440 01/30/16 0400 01/31/16 0344 02/01/16 0450 02/02/16 0553 02/02/16 0554  NA 135  136 136 136 139 139  --   K 4.2  4.2 3.8 3.6 3.3* 3.3*  --   CL 112*  112* 112* 111 110 109  --   CO2 16*  16* 15* 18* 21* 23  --   GLUCOSE 116*  117* 105* 115* 97 145*  --   BUN 117*  117* 109* 101* 89* 82*  --   CREATININE 5.46*  5.45* 4.96* 4.51* 4.04* 3.73*  --   CALCIUM 8.3*  8.3* 8.2* 8.4* 8.2* 8.1*  --   MG  --   --   --   --   --  1.8  PHOS 5.2* 4.7* 4.5 3.9 3.5  --    GFR Estimated Creatinine Clearance: 16.9 mL/min (by C-G formula based on SCr of 3.73 mg/dL).  CBC:  Recent Labs Lab 01/28/16 0008 01/28/16 1245 01/29/16 0440 01/31/16 0344 02/01/16 0450  WBC 13.3* 11.3* 10.1 10.1 10.6*  HGB 8.8* 7.3* 8.0* 7.7* 8.2*  HCT 26.1* 21.9* 24.6* 23.5* 24.8*  MCV 91.9 94.0 94.6 93.6 92.5  PLT 264 215 264 239 222   CBG:  Recent Labs Lab 02/01/16 1703 02/01/16 2029 02/01/16 2145 02/02/16 0741 02/02/16 1309  GLUCAP 214* 169* 219* 149* 156*   Microbiology Recent Results (from the past 240 hour(s))  Surgical pcr screen     Status: Abnormal   Collection Time: 01/27/16  1:12  PM  Result Value Ref Range Status   MRSA, PCR NEGATIVE NEGATIVE Final   Staphylococcus aureus POSITIVE (A) NEGATIVE Final    Comment:        The Xpert SA Assay (FDA approved for NASAL specimens in patients over 68 years of age), is one component of a comprehensive surveillance program.  Test performance has been validated by [facility] for patients greater than or equal to 2 year old. It is not intended to diagnose infection nor to guide or monitor treatment.     Radiology: No results found.  Medications:   . sodium chloride   Intravenous Once  . amiodarone  100 mg Oral Daily  . atorvastatin  80 mg Oral QHS  . Calcifediol ER  30 mcg Oral QHS  . carvedilol  25 mg Oral BID WC  . ceFAZolin      .  ceFAZolin (ANCEF)  IV  1 g Intravenous Q12H  . cloNIDine  0.1 mg Oral BID  . feeding supplement (NEPRO CARB STEADY)  237 mL Oral TID WC  . fentaNYL      . hydrALAZINE  25 mg Oral Q8H  . insulin aspart  0-5 Units Subcutaneous QHS  . insulin aspart  0-9 Units Subcutaneous TID WC  . insulin aspart  6 Units Subcutaneous TID WC  . isosorbide mononitrate  120 mg Oral Daily  . midazolam      . multivitamin with minerals  1 tablet Oral q morning - 10a  . polyethylene glycol  17 g Oral Daily  . [START ON 02/03/2016] predniSONE  40 mg Oral Q breakfast  . rifampin  600 mg Oral Daily  . senna-docusate  2 tablet Oral BID  . sodium bicarbonate  650 mg Oral BID  . tamsulosin  0.4 mg Oral QPC supper   Continuous Infusions: . norepinephrine (LEVOPHED) Adult infusion Stopped (01/28/16 1815)  . sodium chloride irrigation         LOS: 17 days   Mai Longnecker MD [facility] Pager [phone].    *Please refer to [email or web address], password [email or web address] to get updated schedule on who will round on this patient, as hospitalists switch teams weekly. If 7PM-7AM, please contact night-coverage at [email or web address], password [email or web address] for any overnight needs.  02/02/2016, 4:05 PM

## 2016-02-02 NOTE — Progress Notes (Signed)
   CARDIOTHORACIC SURGERY OPERATIVE PROCEDURE BACK UP NOTE  Date of Procedure:   01/27/2016  Surgeon:    Valentina Gu. Roxy Manns, MD   I was requested by Dr. Lovena Le to be on standby for the pacemaker explant/lead extraction performed on [facility] on 01/27/2016. I arrived at the operating room at 1527 and departed at [facility]. Roxy Manns MD

## 2016-02-02 NOTE — Care Management Important Message (Signed)
Important Message  Patient Details  Name: [name] BRISKEY Sr. MRN: [id number] Date of Birth: 23-May-1936   Medicare Important Message Given:  Yes    Casandra Dallaire Abena 02/02/2016, 10:44 AM

## 2016-02-03 LAB — RENAL FUNCTION PANEL
ALBUMIN: 1.9 g/dL — AB (ref 3.5–5.0)
ANION GAP: 8 (ref 5–15)
BUN: 76 mg/dL — ABNORMAL HIGH (ref 6–20)
CALCIUM: 8.2 mg/dL — AB (ref 8.9–10.3)
CO2: 22 mmol/L (ref 22–32)
CREATININE: 3.61 mg/dL — AB (ref 0.61–1.24)
Chloride: 109 mmol/L (ref 101–111)
GFR, EST AFRICAN AMERICAN: 17 mL/min — AB (ref >60)
GFR, EST NON AFRICAN AMERICAN: 15 mL/min — AB (ref >60)
Glucose, Bld: 118 mg/dL — ABNORMAL HIGH (ref 65–99)
PHOSPHORUS: 4.1 mg/dL (ref 2.5–4.6)
Potassium: 3.8 mmol/L (ref 3.5–5.1)
SODIUM: 139 mmol/L (ref 135–145)

## 2016-02-03 LAB — GLUCOSE, CAPILLARY
Glucose-Capillary: 98 mg/dL (ref 65–99)
Glucose-Capillary: 209 mg/dL — ABNORMAL HIGH (ref 65–99)

## 2016-02-03 MED ORDER — FUROSEMIDE 80 MG PO TABS
80.0000 mg | ORAL_TABLET | Freq: Every day | ORAL | Status: DC
  Administered 2016-02-03: 80 mg via ORAL
  Filled 2016-02-03: qty 1

## 2016-02-03 MED ORDER — SENNOSIDES-DOCUSATE SODIUM 8.6-50 MG PO TABS: 2.0000 | ORAL_TABLET | Freq: Two times a day (BID) | ORAL | Status: DC

## 2016-02-03 MED ORDER — OXYCODONE-ACETAMINOPHEN 5-325 MG PO TABS: 1.0000 | ORAL_TABLET | Freq: Four times a day (QID) | ORAL | 0 refills | Status: DC | PRN

## 2016-02-03 MED ORDER — INSULIN ASPART 100 UNIT/ML ~~LOC~~ SOLN: 0.0000 [IU] | Freq: Every day | SUBCUTANEOUS | 11 refills | Status: DC

## 2016-02-03 MED ORDER — SODIUM BICARBONATE 650 MG PO TABS: 650.0000 mg | ORAL_TABLET | Freq: Two times a day (BID) | ORAL | Status: DC

## 2016-02-03 MED ORDER — INSULIN ASPART 100 UNIT/ML ~~LOC~~ SOLN: 6.0000 [IU] | Freq: Three times a day (TID) | SUBCUTANEOUS | 11 refills | Status: DC

## 2016-02-03 MED ORDER — CEFAZOLIN IN D5W 1 GM/50ML IV SOLN: 1.0000 g | Freq: Two times a day (BID) | INTRAVENOUS | 0 refills | Status: DC

## 2016-02-03 MED ORDER — INSULIN ASPART 100 UNIT/ML ~~LOC~~ SOLN: 0.0000 [IU] | Freq: Three times a day (TID) | SUBCUTANEOUS | 11 refills | Status: DC

## 2016-02-03 MED ORDER — PREDNISONE 10 MG PO TABS: ORAL_TABLET | ORAL | Status: DC

## 2016-02-03 MED ORDER — RIFAMPIN 300 MG PO CAPS: 600.0000 mg | ORAL_CAPSULE | Freq: Every day | ORAL | Status: DC

## 2016-02-03 MED ORDER — FUROSEMIDE 80 MG PO TABS: 80.0000 mg | ORAL_TABLET | Freq: Every day | ORAL | Status: DC

## 2016-02-03 MED ORDER — NEPRO/CARBSTEADY PO LIQD: 237.0000 mL | Freq: Three times a day (TID) | ORAL | 0 refills | Status: DC

## 2016-02-03 MED ORDER — POTASSIUM CHLORIDE CRYS ER 20 MEQ PO TBCR: 30.0000 meq | EXTENDED_RELEASE_TABLET | Freq: Every day | ORAL | Status: DC

## 2016-02-03 MED FILL — Calcifediol Cap ER 30 MCG: ORAL | Qty: 1 | Status: AC

## 2016-02-03 NOTE — Anesthesia Postprocedure Evaluation (Signed)
Anesthesia Post Note  Patient: [name] WALTNER Sr.  Procedure(s) Performed: Procedure(s) (LRB): PACEMAKER EXTRACTION (Right) TRANSESOPHAGEAL ECHOCARDIOGRAM (TEE) (N/A) EXPOSURE OF RIGHT COMMON FEMORAL ARTERY AND REMOVAL FOREIGN (Right)  Patient location during evaluation: PACU Anesthesia Type: General Level of consciousness: awake and alert, awake and oriented Pain management: pain level controlled Vital Signs Assessment: post-procedure vital signs reviewed and stable Respiratory status: spontaneous breathing, respiratory function stable and nonlabored ventilation Cardiovascular status: stable and blood pressure returned to baseline Anesthetic complications: no    Last Vitals:  Vitals:   02/03/16 0325 02/03/16 0745  BP: (!) 136/49 (!) 160/72  Pulse: 78 66  Resp: 11 13  Temp: 36.8 C 37.2 C    Last Pain:  Vitals:   02/03/16 0745  TempSrc: Oral  PainSc:                  Charese Abundis COKER

## 2016-02-03 NOTE — Consult Note (Signed)
   [facility] CM Inpatient Consult   02/03/2016  [name] DEREN Sr. [birth year] [id number]  .Patient screened for potential [place] Management services. Patient is eligible for [place]. Electronic medical record reveals patient's discharge plan is skilled nursing facility. [facility] Management services not appropriate at this time.  For questions please contact:   Natividad Brood, RN BSN [facility] Liaison  [phone] business mobile phone Toll free office [phone]

## 2016-02-03 NOTE — Progress Notes (Signed)
Physical Therapy Treatment Patient Details Name: [name] Sr. 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  Has CAP, hematuria, L wrist and ankle pain with gout history. 7/12 I&D Lt wrist with pseudogout diagnosed; 7/20 pacemaker removed and Rt groin/femoral artery opened to retrieve pacemaker lead PMHx:  SSS, pacemaker, CKD, PAF,     PT Comments    Patient with flat affect, however completely agreeable to participate with therapies. Lt wrist and bil foot pain due to ?pseudogout remain limiting factor in progressing patient's mobility.   Follow Up Recommendations  SNF     Equipment Recommendations  Other (comment) (TBD at next venue of care)    Recommendations for Other Services       Precautions / Restrictions Precautions Precautions: Fall Precaution Comments: septic arthritis Lt wrist, gout flare up Rt ankle, Lt toes Required Braces or Orthoses: Other Brace/Splint Other Brace/Splint: wrist splint for comfort only Restrictions Weight Bearing Restrictions: Yes LUE Weight Bearing: Weight bearing as tolerated    Mobility  Bed Mobility Overal bed mobility: Needs Assistance Bed Mobility: Supine to Sit     Supine to sit: HOB elevated;Mod assist Sit to supine:  (due to near syncope)   General bed mobility comments: Patient moved legs over EOB and use RUE to pull to sit with assist to torso and scooting out to EOB  Transfers Overall transfer level: Needs assistance Equipment used: Left platform walker;None Transfers: Sit to/from Stand;Stand Pivot Transfers Sit to Stand: +2 physical assistance;Mod assist;From elevated surface (heavy mod assist) Stand pivot transfers: +2 physical assistance;+2 safety/equipment;Mod assist       General transfer comment: to recliner (pt request); better stepping/advancing  LEs  Ambulation/Gait             General Gait Details: unable due to pain   Stairs            Wheelchair Mobility    Modified Rankin (Stroke Patients Only)       Balance   Sitting-balance support: No upper extremity supported;Feet supported Sitting balance-Leahy Scale: Fair     Standing balance support: Bilateral upper extremity supported Standing balance-Leahy Scale: Zero                      Cognition Arousal/Alertness: Awake/alert Behavior During Therapy: Flat affect Overall Cognitive Status: Within Functional Limits for tasks assessed                      Exercises General Exercises - Upper Extremity Shoulder Flexion:  (to 45 only due to wrist pain) General Exercises - Lower Extremity Heel Slides:  (3)    General Comments        Pertinent Vitals/Pain Pain Assessment: Faces Faces Pain Scale: Hurts even more Pain Location: Lt wrist>bil feet Pain Descriptors / Indicators: Guarding Pain Intervention(s): Limited activity within patient's tolerance;Monitored during session;Repositioned    Home Living                      Prior Function            PT Goals (current goals can now be found in the care plan section) Acute Rehab PT Goals Patient Stated Goal: get moving Time For Goal Achievement: 02/04/16 Potential to Achieve Goals: Good Progress towards PT goals: Progressing toward goals (slow)  Frequency  Min 2X/week    PT Plan Current plan remains appropriate    Co-evaluation             End of Session Equipment Utilized During Treatment: Gait belt Activity Tolerance: Patient limited by pain;Patient limited by fatigue Patient left: with call bell/phone within reach;in chair     Time: 1137-1201 PT Time Calculation (min) (ACUTE ONLY): 24 min  Charges:  $Therapeutic Activity: 23-37 mins                    G Codes:      Maycie Luera Mar 01, 2016, 1:02 PM  Pager [phone]

## 2016-02-03 NOTE — Clinical Social Work Placement (Signed)
   CLINICAL SOCIAL WORK PLACEMENT  NOTE  Date:  02/03/2016  Patient Details  Name: [name] KOSER Sr. MRN: [id number] Date of Birth: 07-11-1936  Clinical Social Work is seeking post-discharge placement for this patient at the [place] level of care (*CSW will initial, date and re-position this form in  chart as items are completed):  Yes   Patient/family provided with [place] Work Department's list of facilities offering this level of care within the geographic area requested by the patient (or if unable, by the patient's family).  Yes   Patient/family informed of their freedom to choose among providers that offer the needed level of care, that participate in Medicare, Medicaid or managed care program needed by the patient, have an available bed and are willing to accept the patient.  Yes   Patient/family informed of [place]'s ownership interest in [facility] and [facility], as well as of the fact that they are under no obligation to receive care at these facilities.  PASRR submitted to EDS on 01/21/16     PASRR number received on 01/21/16     Existing PASRR number confirmed on       FL2 transmitted to all facilities in geographic area requested by pt/family on 01/21/16     FL2 transmitted to all facilities within larger geographic area on       Patient informed that his/her managed care company has contracts with or will negotiate with certain facilities, including the following:        Yes   Patient/family informed of bed offers received.  Patient chooses bed at [facility]     Physician recommends and patient chooses bed at      Patient to be transferred to [facility] on 02/03/16.  Patient to be transferred to facility by ptar     Patient family notified on 02/03/16 of transfer.  Name of family member notified:  [name]     PHYSICIAN Please sign FL2     Additional Comment:     _______________________________________________ Cranford Mon, LCSW 02/03/2016, 1:33 PM

## 2016-02-03 NOTE — Discharge Instructions (Signed)
Follow with Primary MD [name], MD after discharge from SNF  Get CBC, CMP, checked  by Primary MD next visit.    Activity: As tolerated with Full fall precautions use walker/cane & assistance as needed   Disposition SNF   Diet: Heart Healthy , carbohydrate modified , with feeding assistance and aspiration precautions.  For Heart failure patients - Check your Weight same time everyday, if you gain over 2 pounds, or you develop in leg swelling, experience more shortness of breath or chest pain, call your Primary MD immediately. Follow Cardiac Low Salt Diet and 1.5 lit/day fluid restriction.   On your next visit with your primary care physician please Get Medicines reviewed and adjusted.   Please request your Prim.MD to go over all Hospital Tests and Procedure/Radiological results at the follow up, please get all Hospital records sent to your Prim MD by signing hospital release before you go home.   If you experience worsening of your admission symptoms, develop shortness of breath, life threatening emergency, suicidal or homicidal thoughts you must seek medical attention immediately by calling 911 or calling your MD immediately  if symptoms less severe.  You Must read complete instructions/literature along with all the possible adverse reactions/side effects for all the Medicines you take and that have been prescribed to you. Take any new Medicines after you have completely understood and accpet all the possible adverse reactions/side effects.   Do not drive, operating heavy machinery, perform activities at heights, swimming or participation in water activities or provide baby sitting services if your were admitted for syncope or siezures until you have seen by Primary MD or a Neurologist and advised to do so again.  Do not drive when taking Pain medications.    Do not take more than prescribed Pain, Sleep and Anxiety Medications  Special Instructions: If you have smoked or  chewed Tobacco  in the last 2 yrs please stop smoking, stop any regular Alcohol  and or any Recreational drug use.  Wear Seat belts while driving.   Please note  You were cared for by a hospitalist during your hospital stay. If you have any questions about your discharge medications or the care you received while you were in the hospital after you are discharged, you can call the unit and asked to speak with the hospitalist on call if the hospitalist that took care of you is not available. Once you are discharged, your primary care physician will handle any further medical issues. Please note that NO REFILLS for any discharge medications will be authorized once you are discharged, as it is imperative that you return to your primary care physician (or establish a relationship with a primary care physician if you do not have one) for your aftercare needs so that they can reassess your need for medications and monitor your lab values.

## 2016-02-03 NOTE — Progress Notes (Signed)
Patient will discharge to [facility]  Anticipated discharge date: 7/25 Family notified:wife Transportation by [facility] scheduled for 2pm  CSW signing off.  Domenica Reamer, [place] Social Worker [phone]

## 2016-02-03 NOTE — Progress Notes (Signed)
DC instructions printed off and placed in packet for SNF.  Leaving indwelling foley per MD.  DC'd cvc.  Pt has no s/s of any acute distress noted

## 2016-02-03 NOTE — Progress Notes (Signed)
     S/P ventricular lead via a right femoral vein approach  Incision clean and dry Distal right foot active range of motion intact and sensation intact   Aylissa Heinemann MAUREEN PA-C

## 2016-02-03 NOTE — Progress Notes (Signed)
Subjective: Interval History: has complaints still sore with gout but better.  Objective: Vital signs in last 24 hours: Temp:  [98.2 F (36.8 C)-100.5 F (38.1 C)] 98.2 F (36.8 C) (07/25 0325) Pulse Rate:  [78-109] 78 (07/25 0325) Resp:  [10-18] 11 (07/25 0325) BP: (93-194)/(44-97) 136/49 (07/25 0325) SpO2:  [96 %-100 %] 98 % (07/25 0325) Weight:  [81.8 kg (180 lb 5.4 oz)] 81.8 kg (180 lb 5.4 oz) (07/25 0325) Weight change: -0.4 kg (-14.1 oz)  Intake/Output from previous day: 07/24 0701 - 07/25 0700 In: 555 [P.O.:480; IV Piggyback:50] Out: [id number] [Urine:1550] Intake/Output this shift: No intake/output data recorded.  General appearance: alert, cooperative and no distress Resp: diminished breath sounds bilaterally and rales bibasilar Chest wall: RIJ cath Cardio: S1, S2 normal and systolic murmur: holosystolic 2/6, blowing at apex GI: soft, nontender Extremities: edema 2+  Lab Results:  Recent Labs  02/01/16 0450  WBC 10.6*  HGB 8.2*  HCT 24.8*  PLT 222   BMET:  Recent Labs  02/02/16 0553 02/03/16 0601  NA 139 139  K 3.3* 3.8  CL 109 109  CO2 23 22  GLUCOSE 145* 118*  BUN 82* 76*  CREATININE 3.73* 3.61*  CALCIUM 8.1* 8.2*   No results for input(s): PTH in the last 72 hours. Iron Studies: No results for input(s): IRON, TIBC, TRANSFERRIN, FERRITIN in the last 72 hours.  Studies/Results: Ir Fluoro Guide Cv Line Left  Result Date: 02/02/2016 INDICATION: 80 year old with bacteremia. Request for placement of a tunneled central venous catheter. EXAM: FLUOROSCOPIC AND ULTRASOUND GUIDED PLACEMENT OF A TUNNELED CENTRAL VENOUS CATHETER Physician: Stephan Minister. Henn, MD FLUOROSCOPY TIME:  8 minutes and 42 seconds, 25.3 mGy MEDICATIONS: 3 mg versed, 100 mcg fentanyl. A radiology nurse monitored the patient for moderate sedation. ANESTHESIA/SEDATION: Moderate sedation time: 45 minutes PROCEDURE: Informed consent was obtained for placement of a tunneled central venous catheter.  The patient was placed supine on the interventional table. Ultrasound confirmed a patent left internal jugularvein. Ultrasound images were obtained for documentation. The left side of the neck was prepped and draped in a sterile fashion. The left side of the neck was anesthetized with 1% lidocaine. Maximal barrier sterile technique was utilized including caps, mask, sterile gowns, sterile gloves, sterile drape, hand hygiene and skin antiseptic. A small incision was made with #11 blade scalpel. A 21 gauge needle directed into the left internal jugular vein with ultrasound guidance. Wire repeatedly went up towards the head. Left internal jugular vein was punctured multiple times in different orientations but the wire continued to up towards the head. Eventually, the left internal jugular vein was punctured with a 19 gauge needle using ultrasound guidance. A Bentson wire was advanced into the left subclavian vein. A 5 [place] Kumpe catheter was directed into the left innominate vein and down the SVC. Small incision was made below the left clavicle. A single lumen Powerline catheter was tunneled to the left neck dermatotomy site. Cuff was placed underneath the skin. Kumpe catheter was exchanged for the micropuncture dilator set but a 0.18 wire could not be successfully advanced into the SVC. Eventually, the stiff Glidewire was advanced into the SVC and a 20 cm, 4 [place] dilator was advanced over the wire. The stiff Glidewire was removed for a 0.18 wire. The peel-away sheath was placed. The catheter was cut to an appropriate length. Catheter was advanced through the peel-away sheath and coiled in left innominate vein. Catheter was subsequently advanced into the SVC using the stiff Glidewire. Catheter  tip was placed at the superior cavoatrial junction. The catheter aspirated and flushed well. The left neck dermatotomy site was closed using absorbable suture and Dermabond. The catheter was sutured to the left chest with  Prolene suture. Fluoroscopic and ultrasound images were taken and saved for documentation. FINDINGS: Tortuous central venous anatomy. Catheter tip at the superior cavoatrial junction. COMPLICATIONS: None IMPRESSION: Successful placement of a left jugular tunneled central venous catheter using ultrasound and fluoroscopic guidance. Electronically Signed   By: Markus Daft M.D.   On: 02/02/2016 17:36  Ir US Guide Vasc Access Left  Result Date: 02/02/2016 INDICATION: 80 year old with bacteremia. Request for placement of a tunneled central venous catheter. EXAM: FLUOROSCOPIC AND ULTRASOUND GUIDED PLACEMENT OF A TUNNELED CENTRAL VENOUS CATHETER Physician: Stephan Minister. Henn, MD FLUOROSCOPY TIME:  8 minutes and 42 seconds, 25.3 mGy MEDICATIONS: 3 mg versed, 100 mcg fentanyl. A radiology nurse monitored the patient for moderate sedation. ANESTHESIA/SEDATION: Moderate sedation time: 45 minutes PROCEDURE: Informed consent was obtained for placement of a tunneled central venous catheter. The patient was placed supine on the interventional table. Ultrasound confirmed a patent left internal jugularvein. Ultrasound images were obtained for documentation. The left side of the neck was prepped and draped in a sterile fashion. The left side of the neck was anesthetized with 1% lidocaine. Maximal barrier sterile technique was utilized including caps, mask, sterile gowns, sterile gloves, sterile drape, hand hygiene and skin antiseptic. A small incision was made with #11 blade scalpel. A 21 gauge needle directed into the left internal jugular vein with ultrasound guidance. Wire repeatedly went up towards the head. Left internal jugular vein was punctured multiple times in different orientations but the wire continued to up towards the head. Eventually, the left internal jugular vein was punctured with a 19 gauge needle using ultrasound guidance. A Bentson wire was advanced into the left subclavian vein. A 5 [place] Kumpe catheter was  directed into the left innominate vein and down the SVC. Small incision was made below the left clavicle. A single lumen Powerline catheter was tunneled to the left neck dermatotomy site. Cuff was placed underneath the skin. Kumpe catheter was exchanged for the micropuncture dilator set but a 0.18 wire could not be successfully advanced into the SVC. Eventually, the stiff Glidewire was advanced into the SVC and a 20 cm, 4 [place] dilator was advanced over the wire. The stiff Glidewire was removed for a 0.18 wire. The peel-away sheath was placed. The catheter was cut to an appropriate length. Catheter was advanced through the peel-away sheath and coiled in left innominate vein. Catheter was subsequently advanced into the SVC using the stiff Glidewire. Catheter tip was placed at the superior cavoatrial junction. The catheter aspirated and flushed well. The left neck dermatotomy site was closed using absorbable suture and Dermabond. The catheter was sutured to the left chest with Prolene suture. Fluoroscopic and ultrasound images were taken and saved for documentation. FINDINGS: Tortuous central venous anatomy. Catheter tip at the superior cavoatrial junction. COMPLICATIONS: None IMPRESSION: Successful placement of a left jugular tunneled central venous catheter using ultrasound and fluoroscopic guidance. Electronically Signed   By: Markus Daft M.D.   On: 02/02/2016 17:36   I have reviewed the patient's current medications.  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  Some vol xs add Lasix 2 HTN lower bp meds 3 Anemia 4 Pacer infx on Ancef 5 Prostate Ca. 6 bladder mass 7 Myelodysplasia 8 Debill 9 gout on pred P will add  lasix., will s/o for now and see again at your request    LOS: 18 days   Thersea Manfredonia L 02/03/2016,7:46 AM

## 2016-02-03 NOTE — Anesthesia Preprocedure Evaluation (Signed)
Anesthesia Evaluation  Patient identified by MRN, date of birth, ID band Patient awake    Reviewed: Allergy & Precautions, NPO status , Patient's Chart, lab work & pertinent test results  Airway Mallampati: II  TM Distance: >3 FB Neck ROM: Full    Dental   Pulmonary former smoker,    breath sounds clear to auscultation       Cardiovascular  Rhythm:Regular Rate:Normal     Neuro/Psych    GI/Hepatic   Endo/Other  diabetes  Renal/GU      Musculoskeletal   Abdominal   Peds  Hematology   Anesthesia Other Findings   Reproductive/Obstetrics                             Anesthesia Physical Anesthesia Plan  ASA: III  Anesthesia Plan: General   Post-op Pain Management:    Induction: Intravenous  Airway Management Planned: Oral ETT  Additional Equipment: Arterial line  Intra-op Plan:   Post-operative Plan: Extubation in OR  Informed Consent: I have reviewed the patients History and Physical, chart, labs and discussed the procedure including the risks, benefits and alternatives for the proposed anesthesia with the patient or authorized representative who has indicated his/her understanding and acceptance.     Plan Discussed with: CRNA and Anesthesiologist  Anesthesia Plan Comments:         Anesthesia Quick Evaluation

## 2016-02-03 NOTE — Discharge Summary (Signed)
[name]., is a 80 y.o. male  DOB 22-Sep-1935  MRN [id number].  Admission date:  01/16/2016  Admitting Physician  Bonnell Public, MD  Discharge Date:  02/03/2016   Primary MD  [name], MD  Recommendations for primary care physician for things to follow:  - Patient will need palliative care consult at a facility regarding goals of care , and palliative management if no improvement. - Patient is to follow with urology Dr. Noah Delaine in 1-2 weeks. - Patient to follow with orthopedic Dr. Grandville Silos guarding wrist pseudogout as needed. - Check CBC, BMP in 3 days. - Continue with Foley catheter flushes with normal saline 25 mL of normal saline every 6 hours for hematuria. - Aspirin and Plavix on hold, can be resumed when hematuria results. - Patient may need colchicine on an as-needed basis only for pseudogout.  Admission Diagnosis  Renal insufficiency [N28.9] CAP (community acquired pneumonia) [J18.9] Hemoptysis [R04.2]   Discharge Diagnosis  Renal insufficiency [N28.9] CAP (community acquired pneumonia) [J18.9] Hemoptysis [R04.2]    Principal Problem:   Staphylococcus aureus bacteremia Active Problems:   PAF (paroxysmal atrial fibrillation) (HCC)   Chronic diastolic heart failure (HCC)   Diabetes mellitus ([place])   Essential hypertension   CKD (chronic kidney disease), stage IV (HCC)   Anemia of chronic disease   MDS (myelodysplastic syndrome) (HCC)   CAD of autologous bypass graft   Pacemaker   Hematuria   CAP (community acquired pneumonia)   Hemoptysis   Pain   Wrist swelling   Pseudogout involving multiple joints   Bladder mass   Acute renal failure superimposed on stage 4 chronic kidney disease ([place])   Bacteremia due to Staphylococcus aureus   Arterial hypotension   Pacemaker infection ([place])   Palliative care encounter   Goals of care, counseling/discussion       Past Medical History:  Diagnosis Date  . Angiomyolipoma 2009   On both kidneys noted in 2009  . Arthritis    neck and left wrist  . Atrial fibrillation ([place])   . CAD (coronary artery disease)    s/b CABG 1994, and subsequent stents. Repeat CABG 12/2011,  . Chronic diastolic heart failure ([place])   . CKD (chronic kidney disease)   . Depressive disorder   . Diabetes mellitus type 2 with peripheral artery disease (HCC)    DIET CONTROLLED  . DVT (deep venous thrombosis) ([place]) 2011   Right arm  . Gastroparesis   . GERD (gastroesophageal reflux disease)   . Gout   . History of hiatal hernia   . Hyperlipidemia   . Hypertension   . Internal hemorrhoids without mention of complication   . Ischemic colitis ([place])   . 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 Myelodysplastic syndrome ([place]) 05/22/2013   With low hemoglobin and platelets treated with Procrit  . Osteopenia   . Peptic ulcer    S/p partial gastrectomy in 1969  . Peripheral artery disease ([place])   . Pneumonia 01/16/2016  . Presence of permanent cardiac  pacemaker   . Prostate cancer ([place]) 1997   XRT and lupron  . Renal artery stenosis ([place])   . Sick sinus syndrome ([place])   . Vitamin B 12 deficiency     Past Surgical History:  Procedure Laterality Date  . ABDOMINAL ANGIOGRAM N/A 05/25/2011   Procedure: ABDOMINAL ANGIOGRAM;  Surgeon: Serafina Mitchell, MD;  Location: [facility] CATH LAB;  Service: Cardiovascular;  Laterality: N/A;  . ABDOMINAL ANGIOGRAM N/A 02/13/2013   Procedure: ABDOMINAL ANGIOGRAM;  Surgeon: Serafina Mitchell, MD;  Location: [facility] CATH LAB;  Service: Cardiovascular;  Laterality: N/A;  . APPENDECTOMY  1991  . CELIAC ARTERY ANGIOPLASTY  05-16-12   and stenting  . CHOLECYSTECTOMY  Oct 2009   Laparoscopic  . CORONARY ARTERY BYPASS GRAFT  01/22/1993  . CORONARY ARTERY BYPASS GRAFT  01/03/2012   Procedure: REDO CORONARY ARTERY BYPASS GRAFTING (CABG);  Surgeon: Gaye Pollack, MD;  Location: [place];  Service: Open Heart Surgery;  Laterality:  N/A;  Redo CABG x  using bilateral internal mammary arteries;  left leg greater saphenous vein harvested endoscopically  . FOREIGN BODY REMOVAL ABDOMINAL Right 01/27/2016   Procedure: EXPOSURE OF RIGHT COMMON FEMORAL ARTERY AND REMOVAL FOREIGN;  Surgeon: Evans Lance, MD;  Location: [place];  Service: Cardiovascular;  Laterality: Right;  . HIATAL HERNIA REPAIR     and ulcer repair  . I&D EXTREMITY Left 01/21/2016   Procedure: IRRIGATION AND DEBRIDEMENT EXTREMITY;  Surgeon: Milly Jakob, MD;  Location: [place];  Service: Orthopedics;  Laterality: Left;  . INGUINAL HERNIA REPAIR Right 10/28/2015   Procedure: OPEN RIGHT INGUINAL HERNIA REPAIR;  Surgeon: Greer Pickerel, MD;  Location: [facility];  Service: General;  Laterality: Right;  . INSERTION OF MESH Right 10/28/2015   Procedure: INSERTION OF MESH;  Surgeon: Greer Pickerel, MD;  Location: [facility];  Service: General;  Laterality: Right;  . IR GENERIC HISTORICAL  02/02/2016   IR FLUORO GUIDE CV LINE LEFT 02/02/2016 Markus Daft, MD [facility]  . IR GENERIC HISTORICAL  02/02/2016   IR US GUIDE VASC ACCESS LEFT 02/02/2016 Markus Daft, MD [facility]  . LEFT HEART CATHETERIZATION WITH CORONARY/GRAFT ANGIOGRAM  12/24/2011   Procedure: LEFT HEART CATHETERIZATION WITH Beatrix Fetters;  Surgeon: Sinclair Grooms, MD;  Location: [facility] CATH LAB;  Service: Cardiovascular;;  . LOWER EXTREMITY ANGIOGRAM Bilateral 05/25/2011   Procedure: LOWER EXTREMITY ANGIOGRAM;  Surgeon: Serafina Mitchell, MD;  Location: [facility] CATH LAB;  Service: Cardiovascular;  Laterality: Bilateral;  . OTHER SURGICAL HISTORY  02/13/13   superior mesenteric artery angiogram  . OTHER SURGICAL HISTORY  05/16/12   Stent in stomach  . PACEMAKER GENERATOR CHANGE  12/10/2003   SJM Identity XL DR performed by Dr Leonia Reeves  . PACEMAKER INSERTION  10/18/1994   DDD pacemaker, [employer]. Gen change 12/10/2003.  Marland Kitchen PACEMAKER LEAD REMOVAL Right 01/27/2016   Procedure: PACEMAKER EXTRACTION;  Surgeon: Evans Lance, MD;   Location: [place];  Service: Cardiovascular;  Laterality: Right;  DR. Roxy Manns TO BACKUP CASE  . PARTIAL GASTRECTOMY  1969   Hx of ulcer s/p partial gastrectomy/ has pernicious anemia  . RENAL ANGIOGRAM N/A 02/13/2013   Procedure: RENAL ANGIOGRAM;  Surgeon: Serafina Mitchell, MD;  Location: [facility] CATH LAB;  Service: Cardiovascular;  Laterality: N/A;  . TEE WITHOUT CARDIOVERSION N/A 01/26/2016   Procedure: TRANSESOPHAGEAL ECHOCARDIOGRAM (TEE);  Surgeon: Sueanne Margarita, MD;  Location: [facility]OSCOPY;  Service: Cardiovascular;  Laterality: N/A;  . TEE WITHOUT CARDIOVERSION N/A 01/27/2016  Procedure: TRANSESOPHAGEAL ECHOCARDIOGRAM (TEE);  Surgeon: Evans Lance, MD;  Location: [place];  Service: Cardiovascular;  Laterality: N/A;  . THROMBECTOMY / EMBOLECTOMY SUBCLAVIAN ARTERY  02/02/10   Right subclavian thromboectomy and venous angioplasty, and chronic mesenteric ischemia with Herculink stenting to superior mesenteric and celiac arteries - Dr. Trula Slade  . VISCERAL ANGIOGRAM N/A 05/25/2011   Procedure: Laurita Quint;  Surgeon: Serafina Mitchell, MD;  Location: [facility] CATH LAB;  Service: Cardiovascular;  Laterality: N/A;  . VISCERAL ANGIOGRAM Bilateral 12/28/2011   Procedure: VISCERAL ANGIOGRAM;  Surgeon: Serafina Mitchell, MD;  Location: [facility] CATH LAB;  Service: Cardiovascular;  Laterality: Bilateral;  . VISCERAL ANGIOGRAM N/A 05/16/2012   Procedure: VISCERAL ANGIOGRAM;  Surgeon: Serafina Mitchell, MD;  Location: [facility] CATH LAB;  Service: Cardiovascular;  Laterality: N/A;  . VISCERAL ANGIOGRAM N/A 02/13/2013   Procedure: VISCERAL ANGIOGRAM;  Surgeon: Serafina Mitchell, MD;  Location: [facility] CATH LAB;  Service: Cardiovascular;  Laterality: N/A;       History of present illness and  Hospital Course:     Kindly see H&P for history of present illness and admission details, please review complete Labs, Consult reports and Test reports for all details in brief  HPI  from the history and physical done on the day of admission  01/16/2016  HPI: 80 year old male with multiple medical and cardiac problems. Patient was seen at the PCP's office and was noted to be hypotensive and coughing up bloody sputum and was advised to come to the hospital for further assessment and management. Patient's BP was said to be 70/40 mmHg at the PCP's office. On presentation to the ER, hypotension was not noted, but patient continues to produce bloody sputum. CT chest without contrast reveals multifocal pneumonia. WBC is elevated, with elevated lactic acid. INR is 2.23. Patient reports taking only plavix and aspirin. Hematuria is noted as well. Prior imaging studies suggested possible bladder cancer. Patient specifically denies fever. No headache, no neck pain, no GI symptoms. Platelet is 100, with elevated MCV. AST is 30 and ALT is 12, with alkaline phosphatase of 27 and Total bilirubin of 0.7.  ED Course: Patient was pancultured and started on IV antibiotics.  Pertinent labs: As in HPI EKG: Independently reviewed.  Imaging: independently reviewed  Soldier. is an 80 y.o. male with a PMH of paroxysmal atrial fibrillation/pacemaker not on anticoagulation secondary to history of GI bleed, CAD, stage IV CKD, CAD, hypertension,Liddle's syndrome, myelodysplastic syndrome, PVD and hyperlipidemia who was admitted 01/16/16 with multifocal pneumonia complicated by coagulopathy and hematuria. Blood cultures were positive for MSSA. Patient subsequently developed severe wrist pain and underwent arthrotomy of left wrist joint 01/21/16. Pseudogout suspected given negative synovial fluid cultures. Echocardiogram done which showed echodensity on RA lead, s/p PPM system extraction 7/18 by Dr. Lovena Le (Required vascular surgery to retrieve RV lead tip from RFV)  Staphylococcus aureus bacteremia with Evidence of vegetation on on atrial pacing lead pacemaker , and  right lower lobe pneumonia/Multifocal pneumonia - Blood culture growing MSSA  on 01/16/2016, blood cultures 01/17/16 with no growth to date - Antibiotic management per ID, currently on rifampin and Ancef to finish total of 4 weeks through 02/12/2016 - s/p PPM system extraction 7/18 by Dr. Lovena Le (Required vascular surgery to retrieve RV lead tip from RFV), Per ID recs its safe to replace a new pacemaker 14 days after the first negative blood culture But currently no plan by  EP to replace his  pacemaker. - TEE done 01/26/16 with no evidence of vegetation.  - Palliative care consultation requested given overall failure to thrive, poor prognosis with progressive renal failure (does not want to consider HD and would likely not be a good candidate.   hypertension - With brief episodes of hypotension on 7/19, required brief pressor support, since the blood pressure has been gradually increasing, , currently back on her home regimen including Coreg , Imdur, hydralazine and clonidine .  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 5 cm masslike area consistent with a blood clot.  - Evaluated by urology with a cystoscopy performed, status post bladder clot evacuation and bladder irrigation. Management per urology, initially on CBI , no further blood clots, but remains with significant hematuria, discussed with his primary urologist Dr. Noah Delaine, he can be discharged with normal saline Foley flushes every 6 hours, to follow with him in 1-2 weeks, continue to hold aspirin and Plavix.  Anemia - Multifactorial, anemia of chronic kidney disease, and chronic blood loss in the setting of hematuria, required multiple blood transfusions , his recent hemoglobin is 8.2 on 7/23  Coronary artery disease/chronic diastolic CHF  - Status post CABG. Aspirin/Plavix currently on hold secondary to hematuria and hemoptysis.  - Continue Lipitor,  - Resume Imdur and Coreg giving improving hypotension - No evidence of heart failure exacerbation.  Diuretics currently on hold.  PAF (paroxysmal atrial fibrillation) (HCC) status post pacemaker for sick sinus syndrome - CHA2DS2Vasc is at least 4, but not felt to be a candidate for anticoagulation given history of GI bleeding. Continue amiodarone.  Acute renal failure superimposed on stage IV chronic kidney disease/renal artery stenosis - Baseline creatinine 3.8. Creatinine rising. Creatinine peaked at 5.9, most likely prerenal state/evolution of ischemic ATN, appears to be improving, back to baseline, creatinine is 3.7 at day of discharge.  Wrist swelling secondary to probable pseudogout/ gout in left foot. - Seen by orthopedics, status post incision and drainage 01/21/16. synovial Fluid cultures negative ,CPPD crystals noted, findings most consistent with pseudogout. Will follow-up with Dr. Grandville Silos. On prednisone , started to wean, will need prolonged prednisone taper.  Liddle's syndrome - Monitor for hypokalemia and high blood pressure.  Myelodysplastic syndrome/anemia of chronic disease - Gets Epogen injections every 2 weeks at the cancer center,   Diabetes mellitus with peripheral artery disease - Usually diet-controlled at home, continue with insulin sliding scale,  and 6 units NovoLog before meals .  Gastroparesis  Prostate cancer Gets Lupron injections every 4 months. S/P external beam radiation.  Protein calorie malnutrition -  on Nepro.  Discharge Condition: - Patient stable at time of discharge, intermediate to long-term prognosis is guarded, seen by palliative medicine   during hospital stay to discuss goals of care, plan is to be seen by palliative medicine at facility.  Follow UP   Contact information for follow-up providers    [name], MD.   Specialty:  Family Medicine Contact information: [place] [address] [phone]        Jolyn Nap., MD.   Specialty:  Orthopedic Surgery Why:  if needed for left  wrist Contact information: [place] [address] [phone]        [place] [facility] Follow up on 02/11/2016.   Specialty:  Cardiology Why:  9:30AM Contact information: [address], [address] [place]       MCKINZIE, Nolon Bussing, NP .  Specialty:  Nurse Practitioner Contact information: [place] [address] [phone]        Nicolette Bang, MD. Schedule an appointment as soon as possible for a visit in 2 week(s).   Specialty:  Urology Contact information: [place] [address] [phone]            Contact information for after-discharge care    Destination    [facility].   Specialty:  [place] information: [place] [phone]                    Discharge Instructions  and  Discharge Medications        Medication List    STOP taking these medications   aspirin 81 MG tablet   CALCIUM-VITAMIN D PO   clopidogrel 75 MG tablet Commonly known as:  PLAVIX   spironolactone 25 MG tablet Commonly known as:  ALDACTONE     TAKE these medications   ARTHRITIS PAIN RELIEF 650 MG CR tablet Generic drug:  acetaminophen Take 1,300 mg by mouth 3 (three) times daily.   atorvastatin 80 MG tablet Commonly known as:  LIPITOR Take 80 mg by mouth at bedtime.   carvedilol 25 MG tablet Commonly known as:  COREG TAKE 1 TABLET BY MOUTH  TWICE DAILY WITH MEALS   ceFAZolin 1 GM/50ML Commonly known as:  ANCEF Inject 50 mLs (1 g total) into the vein every 12 (twelve) hours. To continue through 02/12/2016   cloNIDine 0.1 MG tablet Commonly known as:  CATAPRES Take 0.1 mg by mouth 2 (two) times daily.   cyanocobalamin 1000 MCG/ML injection Commonly known as:  (VITAMIN B-12) Inject 1,000 mcg into the muscle every 30 (thirty) days. Vitamin B12 - last injection 09/01/15    epoetin alfa 10000 UNIT/ML injection Commonly known as:  EPOGEN,PROCRIT Inject 10,000 Units into the skin every 14 (fourteen) days. Done at [facility] - last injection 09/22/15   feeding supplement (NEPRO CARB STEADY) Liqd Take 237 mLs by mouth 3 (three) times daily with meals.   furosemide 80 MG tablet Commonly known as:  LASIX Take 1 tablet (80 mg total) by mouth daily. Pt takes 1 and 1/2 tablet daily. (140 mg) total What changed:  when to take this   hydrALAZINE 50 MG tablet Commonly known as:  APRESOLINE Take 1 tablet (50 mg total) by mouth 3 (three) times daily.   insulin aspart 100 UNIT/ML injection Commonly known as:  novoLOG Inject 0-9 Units into the skin 3 (three) times daily with meals.   insulin aspart 100 UNIT/ML injection Commonly known as:  novoLOG Inject 6 Units into the skin 3 (three) times daily with meals.   insulin aspart 100 UNIT/ML injection Commonly known as:  novoLOG Inject 0-5 Units into the skin at bedtime.   isosorbide mononitrate 120 MG 24 hr tablet Commonly known as:  IMDUR Take 1 tablet by mouth  daily   leuprolide 30 MG injection Commonly known as:  LUPRON Inject 30 mg into the muscle every 4 (four) months.   multivitamin with minerals Tabs tablet Take 1 tablet by mouth every morning.   oxyCODONE-acetaminophen 5-325 MG tablet Commonly known as:  PERCOCET/ROXICET Take 1 tablet by mouth every 6 (six) hours as needed for severe pain.   pantoprazole 40 MG tablet Commonly known as:  PROTONIX Take 1 tablet by mouth daily.   potassium chloride SA 20 MEQ tablet Commonly known as:  K-DUR,KLOR-CON Take 1.5 tablets (30 mEq total) by mouth daily. What changed:  how much to take  when to take this  additional instructions   predniSONE 10 MG tablet Commonly known as:  DELTASONE Please use 40 mg oral daily for 3 days, then 30 mg oral daily for 3 days, then 20 mg oral daily for 3 days, then 10 mg oral daily for 3 days   PRESCRIPTION  MEDICATION Supportive Therapy [facility]   RAYALDEE 30 MCG Cpcr Generic drug:  Calcifediol ER Take 30 mcg by mouth at bedtime.   rifampin 300 MG capsule Commonly known as:  RIFADIN Take 2 capsules (600 mg total) by mouth daily. To continue through 02/12/2016   senna-docusate 8.6-50 MG tablet Commonly known as:  Senokot-S Take 2 tablets by mouth 2 (two) times daily.   silodosin 8 MG Caps capsule Commonly known as:  RAPAFLO Take 8 mg by mouth at bedtime.   sodium bicarbonate 650 MG tablet Take 1 tablet (650 mg total) by mouth 2 (two) times daily.     ASK your doctor about these medications   amiodarone 200 MG tablet Commonly known as:  PACERONE Take one-half tablet by  mouth daily         Diet and Activity recommendation: See Discharge Instructions above   Consults obtained -   Cardiology  Orthopedic Surgery  Infectious Disease  Nephrology  Urology  PCCM  Palliative    Major procedures and Radiology Reports - PLEASE review detailed and final reports for all details, in brief -   - 1 unit PRBC transfusion on 7/17, and 1 unit on 7/18, and 1 unit on 7/22 - s/p PPM system extraction 7/18 by Dr. Ladona Ridgel (Required vascular surgery to retrieve RV lead tip from RFV) - IR service for placement of tunneled (L)IJ PICC on 7/16 (removed 7/18 and during complicated pacing system extraction on 7/18. - Right IJ TLC on 7/18  - Left IJ PICC line on 7/24   Dg Chest 2 View  Result Date: 01/16/2016 CLINICAL DATA:  Chest pain, cough for 2 days EXAM: CHEST  2 VIEW COMPARISON:  CT chest 04/03/2014 FINDINGS: Cardiomediastinal silhouette is unremarkable. Status post median sternotomy. Dual lead cardiac pacemaker with leads in right atrium and right ventricle. There is streaky airspace opacification in right base infrahilar region and right upper lobe peripheral perihilar. Findings highly suspicious for pneumonia. Probable small right pleural effusion. Left lung is clear. IMPRESSION: There  is streaky airspace opacification in right base infrahilar region and right upper lobe peripheral perihilar. Findings highly suspicious for pneumonia. Followup PA and lateral chest X-ray is recommended in 3-4 weeks following trial of antibiotic therapy to ensure resolution and exclude underlying malignancy. Probable small right pleural effusion. Electronically Signed   By: Natasha Mead M.D.   On: 01/16/2016 13:58   Dg Wrist 2 Views Left  Result Date: 01/18/2016 CLINICAL DATA:  Acute onset of left wrist pain.  Initial encounter. EXAM: LEFT WRIST - 2 VIEW COMPARISON:  None. FINDINGS: There is no evidence of fracture or dislocation. There is joint space narrowing along the radiocarpal joint, and at the radial aspect of the carpal rows. This resolves in mild chronic deformity of the scaphoid and lunate, with expansion of the scapholunate distance to 5 mm. Scattered vascular calcifications are seen. No additional soft tissue abnormalities are characterized on radiograph. IMPRESSION: 1. No evidence of fracture or dislocation. 2. Degenerative joint space narrowing along the radiocarpal joints, and at the radial aspect of the carpal  rows. This results in mild chronic deformity of the scaphoid and lunate, with expansion of the scapholunate distance to 5 mm, reflecting scapholunate dissociation. Electronically Signed   By: Garald Balding M.D.   On: 01/18/2016 16:57   Ct Chest Wo Contrast  Result Date: 01/16/2016 CLINICAL DATA:  Hemoptysis, cough.  Symptoms since Monday. EXAM: CT CHEST WITHOUT CONTRAST TECHNIQUE: TECHNIQUE Multidetector CT imaging of the chest was performed following the standard protocol without IV contrast. COMPARISON:  Chest radiograph 01/16/2016, CT 04/03/2014 FINDINGS: Cardiovascular: Calcification of the thoracic aorta. Coronary bypass graft. Pacer wires in the RIGHT heart. No pericardial fluid. Mediastinum/Nodes: No axillary or supraclavicular adenopathy. No mediastinal hilar adenopathy. Esophagus  normal. Lungs/Pleura: There is consolidative airspace disease in the RIGHT lower lobe with air bronchograms. A similar pattern in the posterior aspect the RIGHT upper lobe. Smaller pattern of consolidation in the LEFT lower lobe. Findings are consistent with multifocal pneumonia. Upper Abdomen: Limited view of the liver, kidneys, pancreas are unremarkable. Normal adrenal glands. Musculoskeletal: No aggressive osseous lesion.  Midline sternotomy. IMPRESSION: 1. Multifocal pneumonia with most dense infection in RIGHT lower lobe. 2. Coronary artery calcification and aortic atherosclerotic calcification. Electronically Signed   By: Suzy Bouchard M.D.   On: 01/16/2016 16:32   US Renal  Result Date: 01/17/2016 CLINICAL DATA:  80 year old male with microscopic hematuria. Personal history of prostate cancer. Initial encounter. EXAM: RENAL / URINARY TRACT ULTRASOUND COMPLETE COMPARISON:  CT Abdomen and Pelvis 08/30/2015, and earlier. Right upper quadrant ultrasound from today reported separately. FINDINGS: Right Kidney: Length: 12.3 cm. Echogenicity within normal limits. No mass or hydronephrosis visualized. Left Kidney: Length: 12.0 cm. Echogenicity within normal limits. No mass or hydronephrosis visualized. Bladder: 5 cm area which is dependent, complex, and mass like (image 15), however, there is no internal vascularity detected with brief color Doppler interrogation. Elsewhere the urinary bladder appears normal. Prostate measured to be 5.2 x 3.1 x 4.3 cm. IMPRESSION: 1. Abnormal 5 cm complex, mass-like area of dependent echogenicity in the urinary bladder. However, lack of internal vascularity on Doppler suggests this is more likely blood clot rather than a bladder tumor. Further evaluation recommended. 2. Normal for age sonographic appearance of both kidneys. Electronically Signed   By: Genevie Ann M.D.   On: 01/17/2016 08:01   US Renal  Result Date: 01/09/2016 CLINICAL DATA:  Two weeks of hematuria ; history of  prostate enlargement EXAM: RENAL / URINARY TRACT ULTRASOUND COMPLETE COMPARISON:  Abdominal pelvic CT scan of July 30, 2015 FINDINGS: Right Kidney: Length: 11.5 cm. The renal cortical echotexture is equal to or slightly greater than that of the adjacent liver. There is no hydronephrosis nor discrete mass. Left Kidney: Length: 10.3 cm. The renal cortical echotexture is mildly increased similar to that on the right. There is no hydronephrosis. Bladder: There is an irregular mass in the posterior aspect of the urinary bladder wall measuring 2.5 x 2 x 4.4 cm. This appears separate from the more anteriorly and inferiorly positioned prostate gland. The prostate gland itself does produce a mild impression upon the urinary bladder base. IMPRESSION: 1. Posterior bladder wall mass worrisome for malignancy. This is separate from the mildly enlarged prostate gland. 2. Increased renal cortical echotexture bilaterally consistent with medical renal disease. There is no hydronephrosis. Electronically Signed   By: David  Martinique M.D.   On: 01/09/2016 14:44   Ir Fluoro Guide Cv Line Left  Result Date: 02/02/2016 INDICATION: 80 year old with bacteremia. Request for placement of a tunneled central venous catheter.  EXAM: FLUOROSCOPIC AND ULTRASOUND GUIDED PLACEMENT OF A TUNNELED CENTRAL VENOUS CATHETER Physician: Rachelle Hora. Henn, MD FLUOROSCOPY TIME:  8 minutes and 42 seconds, 25.3 mGy MEDICATIONS: 3 mg versed, 100 mcg fentanyl. A radiology nurse monitored the patient for moderate sedation. ANESTHESIA/SEDATION: Moderate sedation time: 45 minutes PROCEDURE: Informed consent was obtained for placement of a tunneled central venous catheter. The patient was placed supine on the interventional table. Ultrasound confirmed a patent left internal jugularvein. Ultrasound images were obtained for documentation. The left side of the neck was prepped and draped in a sterile fashion. The left side of the neck was anesthetized with 1% lidocaine.  Maximal barrier sterile technique was utilized including caps, mask, sterile gowns, sterile gloves, sterile drape, hand hygiene and skin antiseptic. A small incision was made with #11 blade scalpel. A 21 gauge needle directed into the left internal jugular vein with ultrasound guidance. Wire repeatedly went up towards the head. Left internal jugular vein was punctured multiple times in different orientations but the wire continued to up towards the head. Eventually, the left internal jugular vein was punctured with a 19 gauge needle using ultrasound guidance. A Bentson wire was advanced into the left subclavian vein. A 5 [place] Kumpe catheter was directed into the left innominate vein and down the SVC. Small incision was made below the left clavicle. A single lumen Powerline catheter was tunneled to the left neck dermatotomy site. Cuff was placed underneath the skin. Kumpe catheter was exchanged for the micropuncture dilator set but a 0.18 wire could not be successfully advanced into the SVC. Eventually, the stiff Glidewire was advanced into the SVC and a 20 cm, 4 [place] dilator was advanced over the wire. The stiff Glidewire was removed for a 0.18 wire. The peel-away sheath was placed. The catheter was cut to an appropriate length. Catheter was advanced through the peel-away sheath and coiled in left innominate vein. Catheter was subsequently advanced into the SVC using the stiff Glidewire. Catheter tip was placed at the superior cavoatrial junction. The catheter aspirated and flushed well. The left neck dermatotomy site was closed using absorbable suture and Dermabond. The catheter was sutured to the left chest with Prolene suture. Fluoroscopic and ultrasound images were taken and saved for documentation. FINDINGS: Tortuous central venous anatomy. Catheter tip at the superior cavoatrial junction. COMPLICATIONS: None IMPRESSION: Successful placement of a left jugular tunneled central venous catheter using  ultrasound and fluoroscopic guidance. Electronically Signed   By: Richarda Overlie M.D.   On: 02/02/2016 17:36  Ir Fluoro Guide Cv Line Left  Result Date: 01/25/2016 INDICATION: Bacteremia due to Staph aureus infection. History of chronic renal insufficiency. Request made for placement of a tunneled PICC line for antibiotic administration. EXAM: TUNNELED PICC LINE WITH ULTRASOUND AND FLUOROSCOPIC GUIDANCE MEDICATIONS: The patient is currently admitted to the hospital and receiving intravenous antibiotics. The antibiotic was given in an appropriate time interval prior to skin puncture. ANESTHESIA/SEDATION: Moderate (conscious) sedation was employed during this procedure. A total of Versed 0.5 mg and Fentanyl 25 mcg was administered intravenously. Moderate Sedation Time: 33 minutes. The patient's level of consciousness and vital signs were monitored continuously by radiology nursing throughout the procedure under my direct supervision. COMPLICATIONS: None immediate. PROCEDURE: Informed written consent was obtained from the patient after a discussion of the risks, benefits, and alternatives to treatment. Questions regarding the procedure were encouraged and answered. 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 As such, the left neck and chest were prepped with chlorhexidine in a sterile fashion, and a sterile drape was applied covering the operative field. Maximum barrier sterile technique with sterile gowns and gloves were used for the procedure. A timeout was performed prior to the initiation of the procedure. After creating a small venotomy incision, a micropuncture kit was utilized to access the left internal jugular vein under direct, real-time ultrasound guidance after the overlying soft tissues were anesthetized with 1% lidocaine with epinephrine. Ultrasound image documentation was performed. Despite several  attempts, there was difficulty advancing the micro wire centrally. As such, a limited central venogram was performed via the inner 3 French sheath from the micropuncture kit demonstrating patency though marked tortuosity of the central aspect of the left internal jugular vein. Ultimately, a Nitrex wire was able to be advanced centrally. The microwire was kinked to measure appropriate catheter length. The micropuncture sheath was exchanged for a peel-away sheath over a guidewire. Initially, a 5 [place] dual lumen tunneled PICC measuring 28 cm was tunneled in a retrograde fashion from the anterior chest wall to the venotomy incision. Unfortunately, the initial catheter was noted to retract within the tortuous left innominate vein with with tip terminating at the level of the SVC. As such, the existing PICC line was cannulated with a GT Glidewire which was advanced to the level of the IVC. Under intermittent fluoroscopic guidance, the existing 28 cm tunneled PICC line was exchanged for a new slightly longer 31 cm dual lumen tunneled PICC line with tip ultimately terminating within the superior aspect the right atrium. Final catheter positioning was confirmed and documented with a spot radiographic image. The catheter aspirates and flushes normally. The catheter was flushed with appropriate volume heparin dwells. The catheter exit site was secured with a 0-Prolene retention suture. The venotomy incision was closed with Dermabond and Steri-strips. Dressings were applied. The patient tolerated the procedure well without immediate post procedural complication. FINDINGS: After catheter placement, the tip lies within the superior cavoatrial junction. The catheter aspirates and flushes normally and is ready for immediate use. IMPRESSION: Successful placement of 31 cm dual lumen tunneled PICC catheter via the left internal jugular vein with tip terminating at the superior caval atrial junction. The catheter is ready for  immediate use. Electronically Signed   By: Sandi Mariscal M.D.   On: 01/25/2016 13:51   Ir US Guide Vasc Access Left  Result Date: 02/02/2016 INDICATION: 80 year old with bacteremia. Request for placement of a tunneled central venous catheter. EXAM: FLUOROSCOPIC AND ULTRASOUND GUIDED PLACEMENT OF A TUNNELED CENTRAL VENOUS CATHETER Physician: Stephan Minister. Henn, MD FLUOROSCOPY TIME:  8 minutes and 42 seconds, 25.3 mGy MEDICATIONS: 3 mg versed, 100 mcg fentanyl. A radiology nurse monitored the patient for moderate sedation. ANESTHESIA/SEDATION: Moderate sedation time: 45 minutes PROCEDURE: Informed consent was obtained for placement of a tunneled central venous catheter. The patient was placed supine on the interventional table. Ultrasound confirmed a patent left internal jugularvein. Ultrasound images were obtained for documentation. The left side of the neck was prepped and draped in a sterile fashion. The left side of the neck was anesthetized with 1% lidocaine. Maximal barrier sterile technique was utilized including caps, mask, sterile gowns, sterile gloves, sterile drape, hand hygiene and skin antiseptic. A small incision was made with #11 blade scalpel. A 21 gauge needle directed into the left internal jugular vein with ultrasound guidance. Wire repeatedly went up towards the head. Left internal jugular vein was punctured  multiple times in different orientations but the wire continued to up towards the head. Eventually, the left internal jugular vein was punctured with a 19 gauge needle using ultrasound guidance. A Bentson wire was advanced into the left subclavian vein. A 5 [place] Kumpe catheter was directed into the left innominate vein and down the SVC. Small incision was made below the left clavicle. A single lumen Powerline catheter was tunneled to the left neck dermatotomy site. Cuff was placed underneath the skin. Kumpe catheter was exchanged for the micropuncture dilator set but a 0.18 wire could not be  successfully advanced into the SVC. Eventually, the stiff Glidewire was advanced into the SVC and a 20 cm, 4 [place] dilator was advanced over the wire. The stiff Glidewire was removed for a 0.18 wire. The peel-away sheath was placed. The catheter was cut to an appropriate length. Catheter was advanced through the peel-away sheath and coiled in left innominate vein. Catheter was subsequently advanced into the SVC using the stiff Glidewire. Catheter tip was placed at the superior cavoatrial junction. The catheter aspirated and flushed well. The left neck dermatotomy site was closed using absorbable suture and Dermabond. The catheter was sutured to the left chest with Prolene suture. Fluoroscopic and ultrasound images were taken and saved for documentation. FINDINGS: Tortuous central venous anatomy. Catheter tip at the superior cavoatrial junction. COMPLICATIONS: None IMPRESSION: Successful placement of a left jugular tunneled central venous catheter using ultrasound and fluoroscopic guidance. Electronically Signed   By: Richarda Overlie M.D.   On: 02/02/2016 17:36  Ir US Guide Vasc Access Left  Result Date: 01/25/2016 INDICATION: Bacteremia due to Staph aureus infection. History of chronic renal insufficiency. Request made for placement of a tunneled PICC line for antibiotic administration. EXAM: TUNNELED PICC LINE WITH ULTRASOUND AND FLUOROSCOPIC GUIDANCE MEDICATIONS: The patient is currently admitted to the hospital and receiving intravenous antibiotics. The antibiotic was given in an appropriate time interval prior to skin puncture. ANESTHESIA/SEDATION: Moderate (conscious) sedation was employed during this procedure. A total of Versed 0.5 mg and Fentanyl 25 mcg was administered intravenously. Moderate Sedation Time: 33 minutes. The patient's level of consciousness and vital signs were monitored continuously by radiology nursing throughout the procedure under my direct supervision. COMPLICATIONS: None immediate.  PROCEDURE: Informed written consent was obtained from the patient after a discussion of the risks, benefits, and alternatives to treatment. Questions regarding the procedure were encouraged and answered. Given the presence of the right anterior chest wall pacemaker, the decision was made to place a left internal jugular approach dual lumen PICC line. As such, the left neck and chest were prepped with chlorhexidine in a sterile fashion, and a sterile drape was applied covering the operative field. Maximum barrier sterile technique with sterile gowns and gloves were used for the procedure. A timeout was performed prior to the initiation of the procedure. After creating a small venotomy incision, a micropuncture kit was utilized to access the left internal jugular vein under direct, real-time ultrasound guidance after the overlying soft tissues were anesthetized with 1% lidocaine with epinephrine. Ultrasound image documentation was performed. Despite several attempts, there was difficulty advancing the micro wire centrally. As such, a limited central venogram was performed via the inner 3 French sheath from the micropuncture kit demonstrating patency though marked tortuosity of the central aspect of the left internal jugular vein. Ultimately, a Nitrex wire was able to be advanced centrally. The microwire was kinked to measure appropriate catheter length. The micropuncture sheath was exchanged for a peel-away  sheath over a guidewire. Initially, a 5 [place] dual lumen tunneled PICC measuring 28 cm was tunneled in a retrograde fashion from the anterior chest wall to the venotomy incision. Unfortunately, the initial catheter was noted to retract within the tortuous left innominate vein with with tip terminating at the level of the SVC. As such, the existing PICC line was cannulated with a GT Glidewire which was advanced to the level of the IVC. Under intermittent fluoroscopic guidance, the existing 28 cm tunneled PICC line  was exchanged for a new slightly longer 31 cm dual lumen tunneled PICC line with tip ultimately terminating within the superior aspect the right atrium. Final catheter positioning was confirmed and documented with a spot radiographic image. The catheter aspirates and flushes normally. The catheter was flushed with appropriate volume heparin dwells. The catheter exit site was secured with a 0-Prolene retention suture. The venotomy incision was closed with Dermabond and Steri-strips. Dressings were applied. The patient tolerated the procedure well without immediate post procedural complication. FINDINGS: After catheter placement, the tip lies within the superior cavoatrial junction. The catheter aspirates and flushes normally and is ready for immediate use. IMPRESSION: Successful placement of 31 cm dual lumen tunneled PICC catheter via the left internal jugular vein with tip terminating at the superior caval atrial junction. The catheter is ready for immediate use. Electronically Signed   By: Simonne Come M.D.   On: 01/25/2016 13:51   Dg Chest Port 1 View  Result Date: 01/28/2016 CLINICAL DATA:  Weakness, infected pacemaker EXAM: PORTABLE CHEST 1 VIEW COMPARISON:  Portable chest x-ray of January 27, 2016 FINDINGS: The left lung is well-expanded and clear. On the right there is mild volume loss with increased density in the upper and lower lobes which is stable. There is no pneumothorax. There is biapical pleural thickening the heart is normal in size. There is dense calcification in the aortic arch. The sternal wires are intact. The right internal jugular venous catheter tip projects over the distal third of the SVC just above the cavoatrial junction. IMPRESSION: 1. Persistent infiltrate in the right upper and lower lobes. Small right pleural effusion. No definite pneumothorax. 2. The left lung is clear.  No CHF.  Aortic atherosclerosis. Electronically Signed   By: David  [place] M.D.   On: 01/28/2016 07:11   Dg  Chest Portable 1 View  Result Date: 01/27/2016 CLINICAL DATA:  Right IJ placement EXAM: PORTABLE CHEST 1 VIEW COMPARISON:  January 16, 2016 FINDINGS: A right IJ has been placed in the interval, probably terminating just within the right side of the atrium, approximately 2 cm inferior to the caval atrial junction. No pneumothorax. Opacity remains in the right mid lung, less focal in the interval. Opacity and effusion remains in the left lung base, also less focal in the interval. No other interval changes. IMPRESSION: 1. The distal tip of the new right IJ probably terminates approximately 2 cm below the caval atrial junction, just within the right side of the atrium. Recommend repositioning as clinically warranted. No pneumothorax. 2. The right-sided pulmonary infiltrates are less focal in the interval but remain. There is probably a small amount of layering effusion on the right as well. Electronically Signed   By: Gerome Sam III M.D   On: 01/27/2016 19:55   Dg Horace Porteous Guided Needle Plc Aspiration/injection Loc  Result Date: 01/21/2016 CLINICAL DATA:  Left wrist pain and swelling.  Sepsis. EXAM: LEFT WRIST ASPIRATION UNDER FLUOROSCOPY FLUOROSCOPY TIME:  Fluoroscopy Time (in minutes and seconds):  24 SECONDS PROCEDURE: Overlying skin prepped with Betadine, draped in the usual sterile fashion, and infiltrated locally with buffered Lidocaine. 20 gauge needle advanced into the radiocarpal joint under direct fluoroscopic visualization. Approximately 3 cc of bloody fluid was aspirated from the joint and overlying soft tissues. IMPRESSION: Left radiocarpal joint aspiration performed without immediate complication. Mr. Obst with tolerated the procedure well. Technically successful  hip injection under fluoroscopy. Electronically Signed   By: Lorriane Shire M.D.   On: 01/21/2016 08:10   US Abdomen Limited Ruq  Result Date: 01/17/2016 CLINICAL DATA:  80 year old male with abnormal LFTs. Prior cholecystectomy.  Pneumonia. Initial encounter. EXAM: US ABDOMEN LIMITED - RIGHT UPPER QUADRANT COMPARISON:  Noncontrast chest CT 01/16/2016. CT Abdomen and Pelvis 08/30/2015 and earlier FINDINGS: Gallbladder: Surgically absent Common bile duct: Diameter: 6 mm, normal Liver: Chronic 2.6 cm left hepatic lobe cyst (image 18) does demonstrate mild wall thickening, however, this lesion is stable on multiple prior CTs back to 2011. Similar 3.1 cm right lobe cyst, also not significantly changed since 2011. Underlying hepatic echotexture is within normal limits. No intrahepatic biliary ductal dilatation is evident. No new liver lesion identified. Other findings: Negative visible right kidney. No free fluid identified. IMPRESSION: No acute liver or biliary findings. 2-3 cm liver cysts which are mildly thick walled but benign, having not significantly changed since 2011. Electronically Signed   By: Genevie Ann M.D.   On: 01/17/2016 07:56    Micro Results    Recent Results (from the past 240 hour(s))  Surgical pcr screen     Status: Abnormal   Collection Time: 01/27/16  1:12 PM  Result Value Ref Range Status   MRSA, PCR NEGATIVE NEGATIVE Final   Staphylococcus aureus POSITIVE (A) NEGATIVE Final    Comment:        The Xpert SA Assay (FDA approved for NASAL specimens in patients over 63 years of age), is one component of a comprehensive surveillance program.  Test performance has been validated by [facility] for patients greater than or equal to 64 year old. It is not intended to diagnose infection nor to guide or monitor treatment.        Today   Subjective:   [name] today has no headache,no chest or abdominal pain, poor generalized weakness, right ankle gout pain is improving .   Objective:   Blood pressure (!) 108/50, pulse 81, temperature 99 F (37.2 C), temperature source Oral, resp. rate 18, height '5\' 10"'$  (1.778 m), weight 81.8 kg (180 lb 5.4 oz), SpO2 99 %.   Intake/Output Summary (Last 24  hours) at 02/03/16 1245 Last data filed at 02/03/16 0900  Gross per 24 hour  Intake             1010 ml  Output             1000 ml  Net               10 ml    Exam Awake Alert, Oriented x 3, No new F.N deficits, Normal affect [place].AT,PERRAL Supple Neck,No JVD, No cervical lymphadenopathy appriciated.  Symmetrical Chest wall movement, Good air movement bilaterally, CTAB RRR,No Gallops,Rubs or new Murmurs, No Parasternal Heave +ve B.Sounds, Abd Soft, Non tender, No organomegaly appriciated, No rebound -guarding or rigidity. No Cyanosis, Clubbing or edema, No new Rash or bruise  Data Review   CBC w Diff:  Lab Results  Component Value Date   WBC 10.6 (H) 02/01/2016   HGB 8.2 (L)  02/01/2016   HGB 9.3 (L) 01/12/2016   HCT 24.8 (L) 02/01/2016   HCT 28.3 (L) 01/12/2016   PLT 222 02/01/2016   PLT 88 (L) 01/12/2016   LYMPHOPCT 6 01/16/2016   LYMPHOPCT 28.5 01/12/2016   MONOPCT 11 01/16/2016   MONOPCT 17.9 (H) 01/12/2016   EOSPCT 0 01/16/2016   EOSPCT 9.4 (H) 01/12/2016   BASOPCT 0 01/16/2016   BASOPCT 1.2 01/12/2016    CMP:  Lab Results  Component Value Date   NA 139 02/03/2016   NA 146 (H) 08/10/2013   K 3.8 02/03/2016   K 3.9 08/10/2013   CL 109 02/03/2016   CO2 22 02/03/2016   CO2 27 08/10/2013   BUN 76 (H) 02/03/2016   BUN 40.3 (H) 08/10/2013   CREATININE 3.61 (H) 02/03/2016   CREATININE 5.79 (H) 11/07/2015   CREATININE 2.6 (H) 08/10/2013   PROT 7.6 01/16/2016   PROT 7.0 08/10/2013   ALBUMIN 1.9 (L) 02/03/2016   ALBUMIN 3.8 08/10/2013   BILITOT 0.7 01/16/2016   BILITOT 0.33 08/10/2013   ALKPHOS 27 (L) 01/16/2016   ALKPHOS 47 08/10/2013   AST 30 01/16/2016   AST 14 08/10/2013   ALT 12 (L) 01/16/2016   ALT 8 08/10/2013  .   Total Time in preparing paper work, data evaluation and todays exam - 35 minutes  Jasaun Carn M.D on 02/03/2016 at 12:45 PM  [facility]   Office  [phone]

## 2016-02-05 ENCOUNTER — Non-Acute Institutional Stay (SKILLED_NURSING_FACILITY): Payer: Medicare Other | Admitting: Internal Medicine

## 2016-02-05 ENCOUNTER — Encounter: Payer: Self-pay | Admitting: Internal Medicine

## 2016-02-05 DIAGNOSIS — D72829 Elevated white blood cell count, unspecified: Secondary | ICD-10-CM

## 2016-02-05 DIAGNOSIS — M1120 Other chondrocalcinosis, unspecified site: Secondary | ICD-10-CM

## 2016-02-05 DIAGNOSIS — E43 Unspecified severe protein-calorie malnutrition: Secondary | ICD-10-CM | POA: Diagnosis not present

## 2016-02-05 DIAGNOSIS — J189 Pneumonia, unspecified organism: Secondary | ICD-10-CM

## 2016-02-05 DIAGNOSIS — R7881 Bacteremia: Secondary | ICD-10-CM | POA: Diagnosis not present

## 2016-02-05 DIAGNOSIS — I25810 Atherosclerosis of coronary artery bypass graft(s) without angina pectoris: Secondary | ICD-10-CM

## 2016-02-05 DIAGNOSIS — N184 Chronic kidney disease, stage 4 (severe): Secondary | ICD-10-CM

## 2016-02-05 DIAGNOSIS — I10 Essential (primary) hypertension: Secondary | ICD-10-CM | POA: Diagnosis not present

## 2016-02-05 DIAGNOSIS — S3722XS Contusion of bladder, sequela: Secondary | ICD-10-CM

## 2016-02-05 DIAGNOSIS — R5381 Other malaise: Secondary | ICD-10-CM | POA: Diagnosis not present

## 2016-02-05 DIAGNOSIS — K922 Gastrointestinal hemorrhage, unspecified: Secondary | ICD-10-CM

## 2016-02-05 DIAGNOSIS — R627 Adult failure to thrive: Secondary | ICD-10-CM

## 2016-02-05 DIAGNOSIS — E1122 Type 2 diabetes mellitus with diabetic chronic kidney disease: Secondary | ICD-10-CM | POA: Diagnosis not present

## 2016-02-05 DIAGNOSIS — C61 Malignant neoplasm of prostate: Secondary | ICD-10-CM

## 2016-02-05 DIAGNOSIS — D62 Acute posthemorrhagic anemia: Secondary | ICD-10-CM

## 2016-02-05 DIAGNOSIS — B9561 Methicillin susceptible Staphylococcus aureus infection as the cause of diseases classified elsewhere: Secondary | ICD-10-CM

## 2016-02-05 DIAGNOSIS — I480 Paroxysmal atrial fibrillation: Secondary | ICD-10-CM

## 2016-02-05 DIAGNOSIS — M1180 Other specified crystal arthropathies, unspecified site: Secondary | ICD-10-CM | POA: Diagnosis not present

## 2016-02-05 DIAGNOSIS — K5900 Constipation, unspecified: Secondary | ICD-10-CM

## 2016-02-05 NOTE — Progress Notes (Signed)
LOCATION: [facility]  PCP: [name], MD   Code Status: DNR  Goals of care: Advanced Directive information Advanced Directives 02/05/2016  Does patient have an advance directive? Yes  Type of Advance Directive Out of facility DNR (pink MOST or yellow form)  Does patient want to make changes to advanced directive? No - Patient declined  Copy of advanced directive(s) in chart? Yes  Would patient like information on creating an advanced directive? -  Pre-existing out of facility DNR order (yellow form or pink MOST form) -       Extended Emergency Contact Information Primary Emergency Contact: Lorelee Market Address: [address]          [place], [place] [address] [place] Phone: [phone] Relation: Spouse Secondary Emergency Contact: Large,Charles Address: [address] [place], [place] [address] [place] Phone: [phone] Mobile Phone: [phone] Relation: Son   Allergies  Allergen Reactions  . Nsaids Other (See Comments)    GI issue  . Ibuprofen Other (See Comments)    GI Issues  . Ace Inhibitors Cough    Chief Complaint  Patient presents with  . New Admit To SNF    New Admission     HPI:  Patient is a 80 y.o. male seen today for short term rehabilitation post hospital admission from 01/16/16-02/03/16 with staphylococcus aureus bacteremia with sepsis and multifocal pneumonia. There was evidence of vegetation on atrial pacing lead pacemaker and he underwent pacemaker extraction on 01/27/16. he was started on iv antibiotics and also required pressor support. TEE was negative for vegetation. He had severe left wrist pain and underwent arthrotomy on 01/21/16 with study concerning for pseudogout and was started on prednisone. He also had hematuria and was seen by urology. He underwent cystoscopy, bladder clot evacuation and bladder irrigation and his aspirin and plavix were held. He received blood  transfusion. He was seen by palliative care for failure to thrive with poor overall prognosis. He has PMH of afib with pacemaker, CAD, stage 4 CKD, MDS, PVD, DM and GI bleed among others. He is seen in his room today.    Review of Systems:  Constitutional: Negative for fever, diaphoresis. positive for fatigue. HENT: Negative for headache, congestion, nasal discharge. Eyes: Negative for double vision and discharge.  Respiratory: Negative for wheezing. Positive for cough with phlegm and dyspnea with minimal exertion.  Cardiovascular: Negative for chest pain, palpitations. Gastrointestinal: Negative for heartburn, nausea, vomiting. Positive for abdominal pain. Has poor appetite. He had a bowel movement this am.  Genitourinary: Negative for flank pain.  Musculoskeletal: Negative for fall. positive for generalized weakness. Skin: Negative for itching, rash.  Neurological: Negative for dizziness. Psychiatric/Behavioral: Negative for depression.   Past Medical History:  Diagnosis Date  . Angiomyolipoma 2009   On both kidneys noted in 2009  . Arthritis    neck and left wrist  . Atrial fibrillation ([place])   . CAD (coronary artery disease)    s/b CABG 1994, and subsequent stents. Repeat CABG 12/2011,  . Chronic diastolic heart failure ([place])   . CKD (chronic kidney disease)   . Depressive disorder   . Diabetes mellitus type 2 with peripheral artery disease ([facility])    DIET CONTROLLED  . DVT (deep venous thrombosis) ([place]) 2011   Right arm  . Gastroparesis   . GERD (gastroesophageal reflux disease)   . Gout   . History of  hiatal hernia   . Hyperlipidemia   . Hypertension   . Internal hemorrhoids without mention of complication   . Ischemic colitis ([place])   . 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 Myelodysplastic syndrome ([place]) 05/22/2013   With low hemoglobin and platelets treated with Procrit  . Osteopenia   . Peptic ulcer    S/p partial gastrectomy in 1969  . Peripheral artery disease ([place])   .  Pneumonia 01/16/2016  . Presence of permanent cardiac pacemaker   . Prostate cancer ([place]) 1997   XRT and lupron  . Renal artery stenosis ([place])   . Sick sinus syndrome ([place])   . Vitamin B 12 deficiency    Past Surgical History:  Procedure Laterality Date  . ABDOMINAL ANGIOGRAM N/A 05/25/2011   Procedure: ABDOMINAL ANGIOGRAM;  Surgeon: Serafina Mitchell, MD;  Location[facility] CATH LAB;  Service: Cardiovascular;  Laterality: N/A;  . ABDOMINAL ANGIOGRAM N/A 02/13/2013   Procedure: ABDOMINAL ANGIOGRAM;  Surgeon: Serafina Mitchell, MD;  Location: [facility] CATH LAB;  Service: Cardiovascular;  Laterality: N/A;  . APPENDECTOMY  1991  . CELIAC ARTERY ANGIOPLASTY  05-16-12   and stenting  . CHOLECYSTECTOMY  Oct 2009   Laparoscopic  . CORONARY ARTERY BYPASS GRAFT  01/22/1993  . CORONARY ARTERY BYPASS GRAFT  01/03/2012   Procedure: REDO CORONARY ARTERY BYPASS GRAFTING (CABG);  Surgeon: Gaye Pollack, MD;  Location: [facility];  Service: Open Heart Surgery;  Laterality: N/A;  Redo CABG x  using bilateral internal mammary arteries;  left leg greater saphenous vein harvested endoscopically  . FOREIGN BODY REMOVAL ABDOMINAL Right 01/27/2016   Procedure: EXPOSURE OF RIGHT COMMON FEMORAL ARTERY AND REMOVAL FOREIGN;  Surgeon: Evans Lance, MD;  Location: [facility];  Service: Cardiovascular;  Laterality: Right;  . HIATAL HERNIA REPAIR     and ulcer repair  . I&D EXTREMITY Left 01/21/2016   Procedure: IRRIGATION AND DEBRIDEMENT EXTREMITY;  Surgeon: Milly Jakob, MD;  Location: [facility];  Service: Orthopedics;  Laterality: Left;  . INGUINAL HERNIA REPAIR Right 10/28/2015   Procedure: OPEN RIGHT INGUINAL HERNIA REPAIR;  Surgeon: Greer Pickerel, MD;  Location: [facility];  Service: General;  Laterality: Right;  . INSERTION OF MESH Right 10/28/2015   Procedure: INSERTION OF MESH;  Surgeon: Greer Pickerel, MD;  Location: [facility];  Service: General;  Laterality: Right;  . IR GENERIC HISTORICAL  02/02/2016   IR FLUORO GUIDE CV LINE LEFT 02/02/2016 Markus Daft, MD [facility]  . IR GENERIC HISTORICAL  02/02/2016   IR US GUIDE VASC ACCESS LEFT 02/02/2016 Markus Daft, MD [facility]  . LEFT HEART CATHETERIZATION WITH CORONARY/GRAFT ANGIOGRAM  12/24/2011   Procedure: LEFT HEART CATHETERIZATION WITH Beatrix Fetters;  Surgeon: Sinclair Grooms, MD;  Location: [facility] CATH LAB;  Service: Cardiovascular;;  . LOWER EXTREMITY ANGIOGRAM Bilateral 05/25/2011   Procedure: LOWER EXTREMITY ANGIOGRAM;  Surgeon: Serafina Mitchell, MD;  Location: [facility] CATH LAB;  Service: Cardiovascular;  Laterality: Bilateral;  . OTHER SURGICAL HISTORY  02/13/13   superior mesenteric artery angiogram  . OTHER SURGICAL HISTORY  05/16/12   Stent in stomach  . PACEMAKER GENERATOR CHANGE  12/10/2003   SJM Identity XL DR performed by Dr Leonia Reeves  . PACEMAKER INSERTION  10/18/1994   DDD pacemaker, [employer] Jude. Gen change 12/10/2003.  Marland Kitchen PACEMAKER LEAD REMOVAL Right 01/27/2016   Procedure: PACEMAKER EXTRACTION;  Surgeon: Evans Lance, MD;  Location: [facility];  Service: Cardiovascular;  Laterality: Right;  DR. Roxy Manns TO BACKUP CASE  .  PARTIAL GASTRECTOMY  1969   Hx of ulcer s/p partial gastrectomy/ has pernicious anemia  . RENAL ANGIOGRAM N/A 02/13/2013   Procedure: RENAL ANGIOGRAM;  Surgeon: Serafina Mitchell, MD;  Location: [facility] CATH LAB;  Service: Cardiovascular;  Laterality: N/A;  . TEE WITHOUT CARDIOVERSION N/A 01/26/2016   Procedure: TRANSESOPHAGEAL ECHOCARDIOGRAM (TEE);  Surgeon: Sueanne Margarita, MD;  Location: [facility] ENDOSCOPY;  Service: Cardiovascular;  Laterality: N/A;  . TEE WITHOUT CARDIOVERSION N/A 01/27/2016   Procedure: TRANSESOPHAGEAL ECHOCARDIOGRAM (TEE);  Surgeon: Evans Lance, MD;  Location: [facility];  Service: Cardiovascular;  Laterality: N/A;  . THROMBECTOMY / EMBOLECTOMY SUBCLAVIAN ARTERY  02/02/10   Right subclavian thromboectomy and venous angioplasty, and chronic mesenteric ischemia with Herculink stenting to superior mesenteric and celiac arteries - Dr. Trula Slade  . VISCERAL ANGIOGRAM N/A  05/25/2011   Procedure: Laurita Quint;  Surgeon: Serafina Mitchell, MD;  Location: [facility] CATH LAB;  Service: Cardiovascular;  Laterality: N/A;  . VISCERAL ANGIOGRAM Bilateral 12/28/2011   Procedure: VISCERAL ANGIOGRAM;  Surgeon: Serafina Mitchell, MD;  Location: [facility] CATH LAB;  Service: Cardiovascular;  Laterality: Bilateral;  . VISCERAL ANGIOGRAM N/A 05/16/2012   Procedure: VISCERAL ANGIOGRAM;  Surgeon: Serafina Mitchell, MD;  Location: [facility] CATH LAB;  Service: Cardiovascular;  Laterality: N/A;  . VISCERAL ANGIOGRAM N/A 02/13/2013   Procedure: VISCERAL ANGIOGRAM;  Surgeon: Serafina Mitchell, MD;  Location: [facility] CATH LAB;  Service: Cardiovascular;  Laterality: N/A;   Social History:   reports that he quit smoking about 46 years ago. His smoking use included Cigarettes. He quit after 20.00 years of use. He has never used smokeless tobacco. He reports that he does not drink alcohol or use drugs.  Family History  Problem Relation Age of Onset  . Diabetes Mother   . Heart disease Mother     Heart Disease before age 33  . Hyperlipidemia Mother   . Hypertension Mother   . CVA Mother 44    cause of death  . Cancer Father     stomach/liver  . Hypertension Father     possibly hypertensive  . Cancer Sister     Breast cancer  . CAD Brother   . Heart disease Brother   . Cancer Paternal Uncle     colon  . Heart disease Sister   . Diabetes Sister   . Hypertension Sister   . Heart disease Daughter     Heart Disease before age 45  . Hypertension Daughter   . Heart attack Daughter     Medications:   Medication List       Accurate as of 02/05/16  2:53 PM. Always use your most recent med list.          amiodarone 200 MG tablet Commonly known as:  PACERONE Take one-half tablet by  mouth daily   ARTHRITIS PAIN RELIEF 650 MG CR tablet Generic drug:  acetaminophen Take 1,300 mg by mouth 3 (three) times daily.   atorvastatin 80 MG tablet Commonly known as:  LIPITOR Take 80 mg by mouth at  bedtime.   carvedilol 25 MG tablet Commonly known as:  COREG TAKE 1 TABLET BY MOUTH  TWICE DAILY WITH MEALS   ceFAZolin 1 GM/50ML Commonly known as:  ANCEF Inject 50 mLs (1 g total) into the vein every 12 (twelve) hours. To continue through 02/12/2016   cloNIDine 0.1 MG tablet Commonly known as:  CATAPRES Take 0.1 mg by mouth 2 (two) times daily.   cyanocobalamin 1000 MCG/ML injection Commonly  known as:  (VITAMIN B-12) Inject 1,000 mcg into the muscle every 30 (thirty) days. Vitamin B12 - last injection 09/01/15   epoetin alfa 10000 UNIT/ML injection Commonly known as:  EPOGEN,PROCRIT Inject 10,000 Units into the skin every 14 (fourteen) days. Done at [facility] - last injection 09/22/15   furosemide 80 MG tablet Commonly known as:  LASIX Take 1 tablet (80 mg total) by mouth daily. Pt takes 1 and 1/2 tablet daily. (140 mg) total   hydrALAZINE 50 MG tablet Commonly known as:  APRESOLINE Take 1 tablet (50 mg total) by mouth 3 (three) times daily.   insulin aspart 100 UNIT/ML injection Commonly known as:  novoLOG Inject 0-9 Units into the skin 3 (three) times daily with meals.   insulin aspart 100 UNIT/ML injection Commonly known as:  novoLOG Inject 0-5 Units into the skin at bedtime.   isosorbide mononitrate 120 MG 24 hr tablet Commonly known as:  IMDUR Take 1 tablet by mouth  daily   leuprolide 30 MG injection Commonly known as:  LUPRON Inject 30 mg into the muscle every 4 (four) months.   multivitamin with minerals Tabs tablet Take 1 tablet by mouth every morning.   oxyCODONE-acetaminophen 5-325 MG tablet Commonly known as:  PERCOCET/ROXICET Take 1 tablet by mouth every 6 (six) hours as needed for severe pain.   pantoprazole 40 MG tablet Commonly known as:  PROTONIX Take 1 tablet by mouth daily.   potassium chloride SA 20 MEQ tablet Commonly known as:  K-DUR,KLOR-CON Take 1.5 tablets (30 mEq total) by mouth daily.   predniSONE 10 MG tablet Commonly  known as:  DELTASONE Please use 40 mg oral daily for 3 days, then 30 mg oral daily for 3 days, then 20 mg oral daily for 3 days, then 10 mg oral daily for 3 days   RAYALDEE 30 MCG Cpcr Generic drug:  Calcifediol ER Take 30 mcg by mouth at bedtime.   rifampin 300 MG capsule Commonly known as:  RIFADIN Take 600 mg by mouth 2 (two) times daily.   senna-docusate 8.6-50 MG tablet Commonly known as:  Senokot-S Take 2 tablets by mouth 2 (two) times daily.   silodosin 8 MG Caps capsule Commonly known as:  RAPAFLO Take 8 mg by mouth at bedtime.   sodium bicarbonate 650 MG tablet Take 1 tablet (650 mg total) by mouth 2 (two) times daily.   UNABLE TO FIND Med Name: Med pass 120 mL 3 times daily       Immunizations: Immunization History  Administered Date(s) Administered  . Influenza Split 03/29/2015  . Influenza,inj,Quad PF,36+ Mos 03/20/2013  . PPD Test 01/16/2016     Physical Exam:  Vitals:   02/05/16 1446  BP: (!) 167/73  Pulse: 78  Resp: 15  Temp: 97.8 F (36.6 C)  TempSrc: Oral  SpO2: 96%  Weight: 154 lb (69.9 kg)  Height: '5\' 10"'$  (1.778 m)   Body mass index is 22.1 kg/m.  General- elderly male, frail and ill appearing, in no acute distress Head- normocephalic, atraumatic Nose- no nasal discharge Throat- moist mucus membrane Eyes- PERRLA, EOMI, no pallor, no icterus Neck- no cervical lymphadenopathy Cardiovascular- normal Z6,X0, systolic murmur present, 1+ leg edema Respiratory- bilateral poor air entry, no wheeze, no rhonchi, no crackles, no use of accessory muscles Abdomen- bowel sounds present, soft, distended, mild suprapubic tenderness, no CVA tenderness Musculoskeletal- able to move all 4 extremities, generalized weakness more to his lower extremities, edema to his hands noted Neurological- alert and  oriented to person, place and time Skin- warm and dry, stage 2 pressure ulcer to left inner buttock, sutures to right chest with dressing in place, left  chest wall PICC line present, surgical incision to right forearm  Psychiatry- normal mood and affect    Labs reviewed: Basic Metabolic Panel:  Recent Labs  02/01/16 0450 02/02/16 0553 02/02/16 0554 02/03/16 0601  NA 139 139  --  139  K 3.3* 3.3*  --  3.8  CL 110 109  --  109  CO2 21* 23  --  22  GLUCOSE 97 145*  --  118*  BUN 89* 82*  --  76*  CREATININE 4.04* 3.73*  --  3.61*  CALCIUM 8.2* 8.1*  --  8.2*  MG  --   --  1.8  --   PHOS 3.9 3.5  --  4.1   Liver Function Tests:  Recent Labs  10/17/15 0845 11/07/15 1440 01/16/16 1239  02/01/16 0450 02/02/16 0553 02/03/16 0601  AST '17 16 30  '$ --   --   --   --   ALT 6* 5* 12*  --   --   --   --   ALKPHOS 35* 39* 27*  --   --   --   --   BILITOT 0.6 0.4 0.7  --   --   --   --   PROT 7.6 7.8 7.6  --   --   --   --   ALBUMIN 4.6 4.4 4.2  < > 1.9* 2.0* 1.9*  < > = values in this interval not displayed. No results for input(s): LIPASE, AMYLASE in the last 8760 hours. No results for input(s): AMMONIA in the last 8760 hours. CBC:  Recent Labs  12/29/15 1011 01/12/16 1007 01/16/16 1239  01/29/16 0440 01/31/16 0344 02/01/16 0450  WBC 4.6 3.3* 13.1*  < > 10.1 10.1 10.6*  NEUTROABS 2.4 1.4* 11.0*  --   --   --   --   HGB 10.4* 9.3* 9.7*  < > 8.0* 7.7* 8.2*  HCT 32.9* 28.3* 30.4*  < > 24.6* 23.5* 24.8*  MCV 104.8* 101.1* 102.4*  < > 94.6 93.6 92.5  PLT 128* 88* 100*  < > 264 239 222  < > = values in this interval not displayed. Cardiac Enzymes:  Recent Labs  01/25/16 1519  CKTOTAL 60   BNP: Invalid input(s): POCBNP CBG:  Recent Labs  02/02/16 2133 02/03/16 0807 02/03/16 1255  GLUCAP 188* 98 209*    Radiological Exams: Dg Chest 2 View  Result Date: 01/16/2016 CLINICAL DATA:  Chest pain, cough for 2 days EXAM: CHEST  2 VIEW COMPARISON:  CT chest 04/03/2014 FINDINGS: Cardiomediastinal silhouette is unremarkable. Status post median sternotomy. Dual lead cardiac pacemaker with leads in right atrium and right  ventricle. There is streaky airspace opacification in right base infrahilar region and right upper lobe peripheral perihilar. Findings highly suspicious for pneumonia. Probable small right pleural effusion. Left lung is clear. IMPRESSION: There is streaky airspace opacification in right base infrahilar region and right upper lobe peripheral perihilar. Findings highly suspicious for pneumonia. Followup PA and lateral chest X-ray is recommended in 3-4 weeks following trial of antibiotic therapy to ensure resolution and exclude underlying malignancy. Probable small right pleural effusion. Electronically Signed   By: Lahoma Crocker M.D.   On: 01/16/2016 13:58   Dg Wrist 2 Views Left  Result Date: 01/18/2016 CLINICAL DATA:  Acute onset of left wrist pain.  Initial  encounter. EXAM: LEFT WRIST - 2 VIEW COMPARISON:  None. FINDINGS: There is no evidence of fracture or dislocation. There is joint space narrowing along the radiocarpal joint, and at the radial aspect of the carpal rows. This resolves in mild chronic deformity of the scaphoid and lunate, with expansion of the scapholunate distance to 5 mm. Scattered vascular calcifications are seen. No additional soft tissue abnormalities are characterized on radiograph. IMPRESSION: 1. No evidence of fracture or dislocation. 2. Degenerative joint space narrowing along the radiocarpal joints, and at the radial aspect of the carpal rows. This results in mild chronic deformity of the scaphoid and lunate, with expansion of the scapholunate distance to 5 mm, reflecting scapholunate dissociation. Electronically Signed   By: Garald Balding M.D.   On: 01/18/2016 16:57   Ct Chest Wo Contrast  Result Date: 01/16/2016 CLINICAL DATA:  Hemoptysis, cough.  Symptoms since Monday. EXAM: CT CHEST WITHOUT CONTRAST TECHNIQUE: TECHNIQUE Multidetector CT imaging of the chest was performed following the standard protocol without IV contrast. COMPARISON:  Chest radiograph 01/16/2016, CT 04/03/2014  FINDINGS: Cardiovascular: Calcification of the thoracic aorta. Coronary bypass graft. Pacer wires in the RIGHT heart. No pericardial fluid. Mediastinum/Nodes: No axillary or supraclavicular adenopathy. No mediastinal hilar adenopathy. Esophagus normal. Lungs/Pleura: There is consolidative airspace disease in the RIGHT lower lobe with air bronchograms. A similar pattern in the posterior aspect the RIGHT upper lobe. Smaller pattern of consolidation in the LEFT lower lobe. Findings are consistent with multifocal pneumonia. Upper Abdomen: Limited view of the liver, kidneys, pancreas are unremarkable. Normal adrenal glands. Musculoskeletal: No aggressive osseous lesion.  Midline sternotomy. IMPRESSION: 1. Multifocal pneumonia with most dense infection in RIGHT lower lobe. 2. Coronary artery calcification and aortic atherosclerotic calcification. Electronically Signed   By: Suzy Bouchard M.D.   On: 01/16/2016 16:32   US Renal  Result Date: 01/17/2016 CLINICAL DATA:  80 year old male with microscopic hematuria. Personal history of prostate cancer. Initial encounter. EXAM: RENAL / URINARY TRACT ULTRASOUND COMPLETE COMPARISON:  CT Abdomen and Pelvis 08/30/2015, and earlier. Right upper quadrant ultrasound from today reported separately. FINDINGS: Right Kidney: Length: 12.3 cm. Echogenicity within normal limits. No mass or hydronephrosis visualized. Left Kidney: Length: 12.0 cm. Echogenicity within normal limits. No mass or hydronephrosis visualized. Bladder: 5 cm area which is dependent, complex, and mass like (image 15), however, there is no internal vascularity detected with brief color Doppler interrogation. Elsewhere the urinary bladder appears normal. Prostate measured to be 5.2 x 3.1 x 4.3 cm. IMPRESSION: 1. Abnormal 5 cm complex, mass-like area of dependent echogenicity in the urinary bladder. However, lack of internal vascularity on Doppler suggests this is more likely blood clot rather than a bladder tumor.  Further evaluation recommended. 2. Normal for age sonographic appearance of both kidneys. Electronically Signed   By: Genevie Ann M.D.   On: 01/17/2016 08:01   US Renal  Result Date: 01/09/2016 CLINICAL DATA:  Two weeks of hematuria ; history of prostate enlargement EXAM: RENAL / URINARY TRACT ULTRASOUND COMPLETE COMPARISON:  Abdominal pelvic CT scan of July 30, 2015 FINDINGS: Right Kidney: Length: 11.5 cm. The renal cortical echotexture is equal to or slightly greater than that of the adjacent liver. There is no hydronephrosis nor discrete mass. Left Kidney: Length: 10.3 cm. The renal cortical echotexture is mildly increased similar to that on the right. There is no hydronephrosis. Bladder: There is an irregular mass in the posterior aspect of the urinary bladder wall measuring 2.5 x 2 x 4.4 cm. This appears  separate from the more anteriorly and inferiorly positioned prostate gland. The prostate gland itself does produce a mild impression upon the urinary bladder base. IMPRESSION: 1. Posterior bladder wall mass worrisome for malignancy. This is separate from the mildly enlarged prostate gland. 2. Increased renal cortical echotexture bilaterally consistent with medical renal disease. There is no hydronephrosis. Electronically Signed   By: David  Martinique M.D.   On: 01/09/2016 14:44   Ir Fluoro Guide Cv Line Left  Result Date: 02/02/2016 INDICATION: 80 year old with bacteremia. Request for placement of a tunneled central venous catheter. EXAM: FLUOROSCOPIC AND ULTRASOUND GUIDED PLACEMENT OF A TUNNELED CENTRAL VENOUS CATHETER Physician: Stephan Minister. Henn, MD FLUOROSCOPY TIME:  8 minutes and 42 seconds, 25.3 mGy MEDICATIONS: 3 mg versed, 100 mcg fentanyl. A radiology nurse monitored the patient for moderate sedation. ANESTHESIA/SEDATION: Moderate sedation time: 45 minutes PROCEDURE: Informed consent was obtained for placement of a tunneled central venous catheter. The patient was placed supine on the interventional  table. Ultrasound confirmed a patent left internal jugularvein. Ultrasound images were obtained for documentation. The left side of the neck was prepped and draped in a sterile fashion. The left side of the neck was anesthetized with 1% lidocaine. Maximal barrier sterile technique was utilized including caps, mask, sterile gowns, sterile gloves, sterile drape, hand hygiene and skin antiseptic. A small incision was made with #11 blade scalpel. A 21 gauge needle directed into the left internal jugular vein with ultrasound guidance. Wire repeatedly went up towards the head. Left internal jugular vein was punctured multiple times in different orientations but the wire continued to up towards the head. Eventually, the left internal jugular vein was punctured with a 19 gauge needle using ultrasound guidance. A Bentson wire was advanced into the left subclavian vein. A 5 [place] Kumpe catheter was directed into the left innominate vein and down the SVC. Small incision was made below the left clavicle. A single lumen Powerline catheter was tunneled to the left neck dermatotomy site. Cuff was placed underneath the skin. Kumpe catheter was exchanged for the micropuncture dilator set but a 0.18 wire could not be successfully advanced into the SVC. Eventually, the stiff Glidewire was advanced into the SVC and a 20 cm, 4 [place] dilator was advanced over the wire. The stiff Glidewire was removed for a 0.18 wire. The peel-away sheath was placed. The catheter was cut to an appropriate length. Catheter was advanced through the peel-away sheath and coiled in left innominate vein. Catheter was subsequently advanced into the SVC using the stiff Glidewire. Catheter tip was placed at the superior cavoatrial junction. The catheter aspirated and flushed well. The left neck dermatotomy site was closed using absorbable suture and Dermabond. The catheter was sutured to the left chest with Prolene suture. Fluoroscopic and ultrasound images were  taken and saved for documentation. FINDINGS: Tortuous central venous anatomy. Catheter tip at the superior cavoatrial junction. COMPLICATIONS: None IMPRESSION: Successful placement of a left jugular tunneled central venous catheter using ultrasound and fluoroscopic guidance. Electronically Signed   By: Markus Daft M.D.   On: 02/02/2016 17:36  Ir Fluoro Guide Cv Line Left  Result Date: 01/25/2016 INDICATION: Bacteremia due to Staph aureus infection. History of chronic renal insufficiency. Request made for placement of a tunneled PICC line for antibiotic administration. EXAM: TUNNELED PICC LINE WITH ULTRASOUND AND FLUOROSCOPIC GUIDANCE MEDICATIONS: The patient is currently admitted to the hospital and receiving intravenous antibiotics. The antibiotic was given in an appropriate time interval prior to skin puncture. ANESTHESIA/SEDATION: Moderate (conscious) sedation  was employed during this procedure. A total of Versed 0.5 mg and Fentanyl 25 mcg was administered intravenously. Moderate Sedation Time: 33 minutes. The patient's level of consciousness and vital signs were monitored continuously by radiology nursing throughout the procedure under my direct supervision. COMPLICATIONS: None immediate. PROCEDURE: Informed written consent was obtained from the patient after a discussion of the risks, benefits, and alternatives to treatment. Questions regarding the procedure were encouraged and answered. Given the presence of the right anterior chest wall pacemaker, the decision was made to place a left internal jugular approach dual lumen PICC line. As such, the left neck and chest were prepped with chlorhexidine in a sterile fashion, and a sterile drape was applied covering the operative field. Maximum barrier sterile technique with sterile gowns and gloves were used for the procedure. A timeout was performed prior to the initiation of the procedure. After creating a small venotomy incision, a micropuncture kit was  utilized to access the left internal jugular vein under direct, real-time ultrasound guidance after the overlying soft tissues were anesthetized with 1% lidocaine with epinephrine. Ultrasound image documentation was performed. Despite several attempts, there was difficulty advancing the micro wire centrally. As such, a limited central venogram was performed via the inner 3 French sheath from the micropuncture kit demonstrating patency though marked tortuosity of the central aspect of the left internal jugular vein. Ultimately, a Nitrex wire was able to be advanced centrally. The microwire was kinked to measure appropriate catheter length. The micropuncture sheath was exchanged for a peel-away sheath over a guidewire. Initially, a 5 [place] dual lumen tunneled PICC measuring 28 cm was tunneled in a retrograde fashion from the anterior chest wall to the venotomy incision. Unfortunately, the initial catheter was noted to retract within the tortuous left innominate vein with with tip terminating at the level of the SVC. As such, the existing PICC line was cannulated with a GT Glidewire which was advanced to the level of the IVC. Under intermittent fluoroscopic guidance, the existing 28 cm tunneled PICC line was exchanged for a new slightly longer 31 cm dual lumen tunneled PICC line with tip ultimately terminating within the superior aspect the right atrium. Final catheter positioning was confirmed and documented with a spot radiographic image. The catheter aspirates and flushes normally. The catheter was flushed with appropriate volume heparin dwells. The catheter exit site was secured with a 0-Prolene retention suture. The venotomy incision was closed with Dermabond and Steri-strips. Dressings were applied. The patient tolerated the procedure well without immediate post procedural complication. FINDINGS: After catheter placement, the tip lies within the superior cavoatrial junction. The catheter aspirates and flushes  normally and is ready for immediate use. IMPRESSION: Successful placement of 31 cm dual lumen tunneled PICC catheter via the left internal jugular vein with tip terminating at the superior caval atrial junction. The catheter is ready for immediate use. Electronically Signed   By: Sandi Mariscal M.D.   On: 01/25/2016 13:51   Ir US Guide Vasc Access Left  Result Date: 02/02/2016 INDICATION: 80 year old with bacteremia. Request for placement of a tunneled central venous catheter. EXAM: FLUOROSCOPIC AND ULTRASOUND GUIDED PLACEMENT OF A TUNNELED CENTRAL VENOUS CATHETER Physician: Stephan Minister. Henn, MD FLUOROSCOPY TIME:  8 minutes and 42 seconds, 25.3 mGy MEDICATIONS: 3 mg versed, 100 mcg fentanyl. A radiology nurse monitored the patient for moderate sedation. ANESTHESIA/SEDATION: Moderate sedation time: 45 minutes PROCEDURE: Informed consent was obtained for placement of a tunneled central venous catheter. The patient was placed supine on the  interventional table. Ultrasound confirmed a patent left internal jugularvein. Ultrasound images were obtained for documentation. The left side of the neck was prepped and draped in a sterile fashion. The left side of the neck was anesthetized with 1% lidocaine. Maximal barrier sterile technique was utilized including caps, mask, sterile gowns, sterile gloves, sterile drape, hand hygiene and skin antiseptic. A small incision was made with #11 blade scalpel. A 21 gauge needle directed into the left internal jugular vein with ultrasound guidance. Wire repeatedly went up towards the head. Left internal jugular vein was punctured multiple times in different orientations but the wire continued to up towards the head. Eventually, the left internal jugular vein was punctured with a 19 gauge needle using ultrasound guidance. A Bentson wire was advanced into the left subclavian vein. A 5 [place] Kumpe catheter was directed into the left innominate vein and down the SVC. Small incision was made  below the left clavicle. A single lumen Powerline catheter was tunneled to the left neck dermatotomy site. Cuff was placed underneath the skin. Kumpe catheter was exchanged for the micropuncture dilator set but a 0.18 wire could not be successfully advanced into the SVC. Eventually, the stiff Glidewire was advanced into the SVC and a 20 cm, 4 [place] dilator was advanced over the wire. The stiff Glidewire was removed for a 0.18 wire. The peel-away sheath was placed. The catheter was cut to an appropriate length. Catheter was advanced through the peel-away sheath and coiled in left innominate vein. Catheter was subsequently advanced into the SVC using the stiff Glidewire. Catheter tip was placed at the superior cavoatrial junction. The catheter aspirated and flushed well. The left neck dermatotomy site was closed using absorbable suture and Dermabond. The catheter was sutured to the left chest with Prolene suture. Fluoroscopic and ultrasound images were taken and saved for documentation. FINDINGS: Tortuous central venous anatomy. Catheter tip at the superior cavoatrial junction. COMPLICATIONS: None IMPRESSION: Successful placement of a left jugular tunneled central venous catheter using ultrasound and fluoroscopic guidance. Electronically Signed   By: Markus Daft M.D.   On: 02/02/2016 17:36  Ir US Guide Vasc Access Left  Result Date: 01/25/2016 INDICATION: Bacteremia due to Staph aureus infection. History of chronic renal insufficiency. Request made for placement of a tunneled PICC line for antibiotic administration. EXAM: TUNNELED PICC LINE WITH ULTRASOUND AND FLUOROSCOPIC GUIDANCE MEDICATIONS: The patient is currently admitted to the hospital and receiving intravenous antibiotics. The antibiotic was given in an appropriate time interval prior to skin puncture. ANESTHESIA/SEDATION: Moderate (conscious) sedation was employed during this procedure. A total of Versed 0.5 mg and Fentanyl 25 mcg was administered  intravenously. Moderate Sedation Time: 33 minutes. The patient's level of consciousness and vital signs were monitored continuously by radiology nursing throughout the procedure under my direct supervision. COMPLICATIONS: None immediate. PROCEDURE: Informed written consent was obtained from the patient after a discussion of the risks, benefits, and alternatives to treatment. Questions regarding the procedure were encouraged and answered. Given the presence of the right anterior chest wall pacemaker, the decision was made to place a left internal jugular approach dual lumen PICC line. As such, the left neck and chest were prepped with chlorhexidine in a sterile fashion, and a sterile drape was applied covering the operative field. Maximum barrier sterile technique with sterile gowns and gloves were used for the procedure. A timeout was performed prior to the initiation of the procedure. After creating a small venotomy incision, a micropuncture kit was utilized to access the left  internal jugular vein under direct, real-time ultrasound guidance after the overlying soft tissues were anesthetized with 1% lidocaine with epinephrine. Ultrasound image documentation was performed. Despite several attempts, there was difficulty advancing the micro wire centrally. As such, a limited central venogram was performed via the inner 3 French sheath from the micropuncture kit demonstrating patency though marked tortuosity of the central aspect of the left internal jugular vein. Ultimately, a Nitrex wire was able to be advanced centrally. The microwire was kinked to measure appropriate catheter length. The micropuncture sheath was exchanged for a peel-away sheath over a guidewire. Initially, a 5 [place] dual lumen tunneled PICC measuring 28 cm was tunneled in a retrograde fashion from the anterior chest wall to the venotomy incision. Unfortunately, the initial catheter was noted to retract within the tortuous left innominate vein with  with tip terminating at the level of the SVC. As such, the existing PICC line was cannulated with a GT Glidewire which was advanced to the level of the IVC. Under intermittent fluoroscopic guidance, the existing 28 cm tunneled PICC line was exchanged for a new slightly longer 31 cm dual lumen tunneled PICC line with tip ultimately terminating within the superior aspect the right atrium. Final catheter positioning was confirmed and documented with a spot radiographic image. The catheter aspirates and flushes normally. The catheter was flushed with appropriate volume heparin dwells. The catheter exit site was secured with a 0-Prolene retention suture. The venotomy incision was closed with Dermabond and Steri-strips. Dressings were applied. The patient tolerated the procedure well without immediate post procedural complication. FINDINGS: After catheter placement, the tip lies within the superior cavoatrial junction. The catheter aspirates and flushes normally and is ready for immediate use. IMPRESSION: Successful placement of 31 cm dual lumen tunneled PICC catheter via the left internal jugular vein with tip terminating at the superior caval atrial junction. The catheter is ready for immediate use. Electronically Signed   By: Sandi Mariscal M.D.   On: 01/25/2016 13:51   Dg Chest Port 1 View  Result Date: 01/28/2016 CLINICAL DATA:  Weakness, infected pacemaker EXAM: PORTABLE CHEST 1 VIEW COMPARISON:  Portable chest x-ray of January 27, 2016 FINDINGS: The left lung is well-expanded and clear. On the right there is mild volume loss with increased density in the upper and lower lobes which is stable. There is no pneumothorax. There is biapical pleural thickening the heart is normal in size. There is dense calcification in the aortic arch. The sternal wires are intact. The right internal jugular venous catheter tip projects over the distal third of the SVC just above the cavoatrial junction. IMPRESSION: 1. Persistent  infiltrate in the right upper and lower lobes. Small right pleural effusion. No definite pneumothorax. 2. The left lung is clear.  No CHF.  Aortic atherosclerosis. Electronically Signed   By: David  Martinique M.D.   On: 01/28/2016 07:11   Dg Chest Portable 1 View  Result Date: 01/27/2016 CLINICAL DATA:  Right IJ placement EXAM: PORTABLE CHEST 1 VIEW COMPARISON:  January 16, 2016 FINDINGS: A right IJ has been placed in the interval, probably terminating just within the right side of the atrium, approximately 2 cm inferior to the caval atrial junction. No pneumothorax. Opacity remains in the right mid lung, less focal in the interval. Opacity and effusion remains in the left lung base, also less focal in the interval. No other interval changes. IMPRESSION: 1. The distal tip of the new right IJ probably terminates approximately 2 cm below the caval atrial  junction, just within the right side of the atrium. Recommend repositioning as clinically warranted. No pneumothorax. 2. The right-sided pulmonary infiltrates are less focal in the interval but remain. There is probably a small amount of layering effusion on the right as well. Electronically Signed   By: Dorise Bullion III M.D   On: 01/27/2016 19:55   Dg Cyndy Freeze Guided Needle Plc Aspiration/injection Loc  Result Date: 01/21/2016 CLINICAL DATA:  Left wrist pain and swelling.  Sepsis. EXAM: LEFT WRIST ASPIRATION UNDER FLUOROSCOPY FLUOROSCOPY TIME:  Fluoroscopy Time (in minutes and seconds): 24 SECONDS PROCEDURE: Overlying skin prepped with Betadine, draped in the usual sterile fashion, and infiltrated locally with buffered Lidocaine. 20 gauge needle advanced into the radiocarpal joint under direct fluoroscopic visualization. Approximately 3 cc of bloody fluid was aspirated from the joint and overlying soft tissues. IMPRESSION: Left radiocarpal joint aspiration performed without immediate complication. Mr. Besecker with tolerated the procedure well. Technically  successful  hip injection under fluoroscopy. Electronically Signed   By: Lorriane Shire M.D.   On: 01/21/2016 08:10   US Abdomen Limited Ruq  Result Date: 01/17/2016 CLINICAL DATA:  80 year old male with abnormal LFTs. Prior cholecystectomy. Pneumonia. Initial encounter. EXAM: US ABDOMEN LIMITED - RIGHT UPPER QUADRANT COMPARISON:  Noncontrast chest CT 01/16/2016. CT Abdomen and Pelvis 08/30/2015 and earlier FINDINGS: Gallbladder: Surgically absent Common bile duct: Diameter: 6 mm, normal Liver: Chronic 2.6 cm left hepatic lobe cyst (image 18) does demonstrate mild wall thickening, however, this lesion is stable on multiple prior CTs back to 2011. Similar 3.1 cm right lobe cyst, also not significantly changed since 2011. Underlying hepatic echotexture is within normal limits. No intrahepatic biliary ductal dilatation is evident. No new liver lesion identified. Other findings: Negative visible right kidney. No free fluid identified. IMPRESSION: No acute liver or biliary findings. 2-3 cm liver cysts which are mildly thick walled but benign, having not significantly changed since 2011. Electronically Signed   By: Genevie Ann M.D.   On: 01/17/2016 07:56    Assessment/Plan  Physical deconditioning With generalized weakness from this hospitalization. Will have him work with physical therapy and occupational therapy team to help with gait training and muscle strengthening exercises.fall precautions. Skin care. Encourage to be out of bed. Has poor prognosis overall. Will get palliative care consult to review goals of care.   Failure to thrive Has protein calorie malnutrition, multiple medical co-morbidities, severe deconditioning with pressure ulcers and is on antibiotics for bacteremia. Will get palliative care consult to review goals of care.   Staphylococcus bacteremia Continue and complete course of ancef and rifampin on 02/12/16. Monitor wbc and temp curve. Continue wound care  Bladder hematoma S/p  irrigation and removal of clot. Continues to have hematuria noted in foley bag. Will need urology follow up appointment. Continue foley care.  Blood loss anemia Monitor cbc. He is s/p prbc transfusion in hospital. On epoetin q 2 weeks.   Leukocytosis Currently being treated for bacteremia and pneumonia. Monitor wbc and temp curve  mulifocal pneumonia Continue and complete his antibiotics on 02/12/16. Encourage incentive spirometer for pulmonary toileting.   Hypertension Elevated BP reading. Reviewed his medication list. Change his hydralazine to 50 mg qid from tid for now, monitor bp q shift. Check bmp. Continue coreg 25 mg bid, imdur 120 mg daily, clonidine 0.1 mg bid and lasix.   Protein calorie malnutrition Get dietary consult, will need protein supplement, assistance with meals for now and SLP to evaluate for dysphagia. Continue multivitamin  Pseudogout Continue and  complete his prednisone taper. Continue tylenol and percocet current regimen. Get PMR consult.   afib His pacemaker has been removed. Continue amiodarone 100 mg daily  CAD Chest pain free. Continue atorvastatin with coreg and imdur. Continue to hold his aspirin and plavix for now.   Prostate cancer Gets lupron injection, continue rapaflo. Follow with urology  Constipation Continue senokot s 2 tab bid  History of gi bleed With history of gi bleed. Continue protonix 40 mg daily  ckd stage 4 Monitor bmp for now  DM type 2 Lab Results  Component Value Date   HGBA1C 5.9 (H) 09/26/2015   Monitor cbg, continue SSI novolog.   Goals of care: short term rehabilitation   Labs/tests ordered: cbc, cmp  Family/ staff Communication: reviewed care plan with patient and nursing supervisor    Blanchie Serve, MD Internal Medicine [facility] [place], [place] [address] Cell Phone (Monday-Friday 8 am - 5 pm): [phone] On Call: [phone] and follow prompts  after 5 pm and on weekends Office Phone: [phone] Office Fax: [phone]

## 2016-02-09 ENCOUNTER — Ambulatory Visit: Payer: Medicare Other

## 2016-02-09 ENCOUNTER — Other Ambulatory Visit: Payer: Medicare Other

## 2016-02-11 ENCOUNTER — Ambulatory Visit (INDEPENDENT_AMBULATORY_CARE_PROVIDER_SITE_OTHER): Payer: Medicare Other | Admitting: *Deleted

## 2016-02-11 DIAGNOSIS — IMO0002 Reserved for concepts with insufficient information to code with codable children: Secondary | ICD-10-CM

## 2016-02-11 DIAGNOSIS — Z45018 Encounter for adjustment and management of other part of cardiac pacemaker: Secondary | ICD-10-CM

## 2016-02-11 NOTE — Progress Notes (Signed)
Wound check appointment, s/p PPM extraction on 01/27/16 by Dr. Lovena Le.  Foam dressing and sutures removed. Wound without redness or edema.  Incision edges approximated, wound well healed.  Written instructions included in SNF packet regarding wound care monitoring and follow-up if necessary.  ROV with Dr. Lovena Le PRN.

## 2016-02-20 ENCOUNTER — Encounter (HOSPITAL_COMMUNITY): Payer: Self-pay | Admitting: Emergency Medicine

## 2016-02-20 ENCOUNTER — Inpatient Hospital Stay (HOSPITAL_COMMUNITY)
Admission: EM | Admit: 2016-02-20 | Discharge: 2016-02-24 | DRG: 669 | Disposition: A | Discharge status: Skilled Nursing Facility | Payer: Medicare Other | Attending: Internal Medicine | Admitting: Internal Medicine

## 2016-02-20 ENCOUNTER — Observation Stay (HOSPITAL_COMMUNITY): Payer: Medicare Other

## 2016-02-20 ENCOUNTER — Emergency Department (HOSPITAL_COMMUNITY): Payer: Medicare Other

## 2016-02-20 DIAGNOSIS — I5032 Chronic diastolic (congestive) heart failure: Secondary | ICD-10-CM | POA: Diagnosis not present

## 2016-02-20 DIAGNOSIS — N3041 Irradiation cystitis with hematuria: Secondary | ICD-10-CM | POA: Diagnosis not present

## 2016-02-20 DIAGNOSIS — Z903 Acquired absence of stomach [part of]: Secondary | ICD-10-CM

## 2016-02-20 DIAGNOSIS — Z87891 Personal history of nicotine dependence: Secondary | ICD-10-CM

## 2016-02-20 DIAGNOSIS — R29810 Facial weakness: Secondary | ICD-10-CM | POA: Diagnosis present

## 2016-02-20 DIAGNOSIS — D72819 Decreased white blood cell count, unspecified: Secondary | ICD-10-CM | POA: Diagnosis present

## 2016-02-20 DIAGNOSIS — D62 Acute posthemorrhagic anemia: Secondary | ICD-10-CM | POA: Diagnosis present

## 2016-02-20 DIAGNOSIS — Z66 Do not resuscitate: Secondary | ICD-10-CM | POA: Diagnosis present

## 2016-02-20 DIAGNOSIS — R319 Hematuria, unspecified: Secondary | ICD-10-CM | POA: Diagnosis present

## 2016-02-20 DIAGNOSIS — E119 Type 2 diabetes mellitus without complications: Secondary | ICD-10-CM

## 2016-02-20 DIAGNOSIS — L8990 Pressure ulcer of unspecified site, unspecified stage: Secondary | ICD-10-CM | POA: Insufficient documentation

## 2016-02-20 DIAGNOSIS — R569 Unspecified convulsions: Secondary | ICD-10-CM | POA: Diagnosis not present

## 2016-02-20 DIAGNOSIS — T501X5A Adverse effect of loop [high-ceiling] diuretics, initial encounter: Secondary | ICD-10-CM | POA: Diagnosis present

## 2016-02-20 DIAGNOSIS — D61818 Other pancytopenia: Secondary | ICD-10-CM | POA: Diagnosis present

## 2016-02-20 DIAGNOSIS — M109 Gout, unspecified: Secondary | ICD-10-CM | POA: Diagnosis present

## 2016-02-20 DIAGNOSIS — Z951 Presence of aortocoronary bypass graft: Secondary | ICD-10-CM

## 2016-02-20 DIAGNOSIS — I480 Paroxysmal atrial fibrillation: Secondary | ICD-10-CM | POA: Diagnosis present

## 2016-02-20 DIAGNOSIS — E876 Hypokalemia: Secondary | ICD-10-CM | POA: Diagnosis present

## 2016-02-20 DIAGNOSIS — I248 Other forms of acute ischemic heart disease: Secondary | ICD-10-CM | POA: Diagnosis present

## 2016-02-20 DIAGNOSIS — E1122 Type 2 diabetes mellitus with diabetic chronic kidney disease: Secondary | ICD-10-CM | POA: Diagnosis present

## 2016-02-20 DIAGNOSIS — D649 Anemia, unspecified: Secondary | ICD-10-CM

## 2016-02-20 DIAGNOSIS — Z803 Family history of malignant neoplasm of breast: Secondary | ICD-10-CM

## 2016-02-20 DIAGNOSIS — R310 Gross hematuria: Secondary | ICD-10-CM

## 2016-02-20 DIAGNOSIS — M1990 Unspecified osteoarthritis, unspecified site: Secondary | ICD-10-CM | POA: Diagnosis present

## 2016-02-20 DIAGNOSIS — K219 Gastro-esophageal reflux disease without esophagitis: Secondary | ICD-10-CM | POA: Diagnosis present

## 2016-02-20 DIAGNOSIS — I130 Hypertensive heart and chronic kidney disease with heart failure and stage 1 through stage 4 chronic kidney disease, or unspecified chronic kidney disease: Secondary | ICD-10-CM | POA: Diagnosis present

## 2016-02-20 DIAGNOSIS — G40409 Other generalized epilepsy and epileptic syndromes, not intractable, without status epilepticus: Secondary | ICD-10-CM | POA: Diagnosis present

## 2016-02-20 DIAGNOSIS — N184 Chronic kidney disease, stage 4 (severe): Secondary | ICD-10-CM | POA: Diagnosis present

## 2016-02-20 DIAGNOSIS — Z7902 Long term (current) use of antithrombotics/antiplatelets: Secondary | ICD-10-CM

## 2016-02-20 DIAGNOSIS — I10 Essential (primary) hypertension: Secondary | ICD-10-CM | POA: Diagnosis present

## 2016-02-20 DIAGNOSIS — Z79899 Other long term (current) drug therapy: Secondary | ICD-10-CM

## 2016-02-20 DIAGNOSIS — Z9049 Acquired absence of other specified parts of digestive tract: Secondary | ICD-10-CM

## 2016-02-20 DIAGNOSIS — D469 Myelodysplastic syndrome, unspecified: Secondary | ICD-10-CM | POA: Diagnosis present

## 2016-02-20 DIAGNOSIS — Z86718 Personal history of other venous thrombosis and embolism: Secondary | ICD-10-CM

## 2016-02-20 DIAGNOSIS — D696 Thrombocytopenia, unspecified: Secondary | ICD-10-CM | POA: Diagnosis present

## 2016-02-20 DIAGNOSIS — I2510 Atherosclerotic heart disease of native coronary artery without angina pectoris: Secondary | ICD-10-CM | POA: Diagnosis present

## 2016-02-20 DIAGNOSIS — Z823 Family history of stroke: Secondary | ICD-10-CM

## 2016-02-20 DIAGNOSIS — E1151 Type 2 diabetes mellitus with diabetic peripheral angiopathy without gangrene: Secondary | ICD-10-CM | POA: Diagnosis present

## 2016-02-20 DIAGNOSIS — E1159 Type 2 diabetes mellitus with other circulatory complications: Secondary | ICD-10-CM

## 2016-02-20 DIAGNOSIS — Z923 Personal history of irradiation: Secondary | ICD-10-CM

## 2016-02-20 DIAGNOSIS — F329 Major depressive disorder, single episode, unspecified: Secondary | ICD-10-CM | POA: Diagnosis present

## 2016-02-20 DIAGNOSIS — Z8711 Personal history of peptic ulcer disease: Secondary | ICD-10-CM

## 2016-02-20 DIAGNOSIS — D638 Anemia in other chronic diseases classified elsewhere: Secondary | ICD-10-CM | POA: Diagnosis present

## 2016-02-20 DIAGNOSIS — Z9221 Personal history of antineoplastic chemotherapy: Secondary | ICD-10-CM

## 2016-02-20 DIAGNOSIS — Z833 Family history of diabetes mellitus: Secondary | ICD-10-CM

## 2016-02-20 DIAGNOSIS — C61 Malignant neoplasm of prostate: Secondary | ICD-10-CM | POA: Diagnosis present

## 2016-02-20 DIAGNOSIS — Z8249 Family history of ischemic heart disease and other diseases of the circulatory system: Secondary | ICD-10-CM

## 2016-02-20 DIAGNOSIS — Z8614 Personal history of Methicillin resistant Staphylococcus aureus infection: Secondary | ICD-10-CM

## 2016-02-20 DIAGNOSIS — E1143 Type 2 diabetes mellitus with diabetic autonomic (poly)neuropathy: Secondary | ICD-10-CM | POA: Diagnosis present

## 2016-02-20 DIAGNOSIS — M7989 Other specified soft tissue disorders: Secondary | ICD-10-CM | POA: Diagnosis present

## 2016-02-20 DIAGNOSIS — R339 Retention of urine, unspecified: Secondary | ICD-10-CM | POA: Diagnosis present

## 2016-02-20 DIAGNOSIS — Y842 Radiological procedure and radiotherapy as the cause of abnormal reaction of the patient, or of later complication, without mention of misadventure at the time of the procedure: Secondary | ICD-10-CM | POA: Diagnosis present

## 2016-02-20 DIAGNOSIS — E785 Hyperlipidemia, unspecified: Secondary | ICD-10-CM | POA: Diagnosis present

## 2016-02-20 LAB — CBC WITH DIFFERENTIAL/PLATELET
Basophils Absolute: 0 10*3/uL (ref 0.0–0.1)
Basophils Relative: 0 %
EOS PCT: 1 %
Eosinophils Absolute: 0.1 10*3/uL (ref 0.0–0.7)
HEMATOCRIT: 12.2 % — AB (ref 39.0–52.0)
Hemoglobin: 3.4 g/dL — CL (ref 13.0–17.0)
LYMPHS ABS: 1 10*3/uL (ref 0.7–4.0)
Lymphocytes Relative: 14 %
MCH: 31.5 pg (ref 26.0–34.0)
MCHC: 27.9 g/dL — AB (ref 30.0–36.0)
MCV: 113 fL — AB (ref 78.0–100.0)
MONOS PCT: 5 %
Monocytes Absolute: 0.4 10*3/uL (ref 0.1–1.0)
NEUTROS ABS: 5.9 10*3/uL (ref 1.7–7.7)
Neutrophils Relative %: 80 %
Platelets: 113 10*3/uL — ABNORMAL LOW (ref 150–400)
RBC: 1.08 MIL/uL — AB (ref 4.22–5.81)
RDW: 24.3 % — AB (ref 11.5–15.5)
WBC: 7.4 10*3/uL (ref 4.0–10.5)

## 2016-02-20 LAB — I-STAT TROPONIN, ED: Troponin i, poc: 0.06 ng/mL (ref 0.00–0.08)

## 2016-02-20 LAB — I-STAT VENOUS BLOOD GAS, ED
Acid-base deficit: 1 mmol/L (ref 0.0–2.0)
Bicarbonate: 23.1 mEq/L (ref 20.0–24.0)
O2 SAT: 46 %
PCO2 VEN: 34.8 mmHg — AB (ref 45.0–50.0)
PO2 VEN: 24 mmHg — AB (ref 31.0–45.0)
TCO2: 24 mmol/L (ref 0–100)
pH, Ven: 7.429 — ABNORMAL HIGH (ref 7.250–7.300)

## 2016-02-20 LAB — COMPREHENSIVE METABOLIC PANEL
ALBUMIN: 2.2 g/dL — AB (ref 3.5–5.0)
ALK PHOS: 44 U/L (ref 38–126)
ALT: 6 U/L — AB (ref 17–63)
AST: 27 U/L (ref 15–41)
Anion gap: 10 (ref 5–15)
BUN: 35 mg/dL — ABNORMAL HIGH (ref 6–20)
CALCIUM: 7.7 mg/dL — AB (ref 8.9–10.3)
CO2: 21 mmol/L — AB (ref 22–32)
CREATININE: 2.27 mg/dL — AB (ref 0.61–1.24)
Chloride: 110 mmol/L (ref 101–111)
GFR calc Af Amer: 30 mL/min — ABNORMAL LOW (ref >60)
GFR calc non Af Amer: 26 mL/min — ABNORMAL LOW (ref >60)
GLUCOSE: 228 mg/dL — AB (ref 65–99)
Potassium: 4.4 mmol/L (ref 3.5–5.1)
SODIUM: 141 mmol/L (ref 135–145)
Total Bilirubin: 0.2 mg/dL — ABNORMAL LOW (ref 0.3–1.2)
Total Protein: 5.4 g/dL — ABNORMAL LOW (ref 6.5–8.1)

## 2016-02-20 LAB — PREPARE RBC (CROSSMATCH)

## 2016-02-20 LAB — TROPONIN I: TROPONIN I: 0.04 ng/mL — AB (ref <0.03)

## 2016-02-20 LAB — GLUCOSE, CAPILLARY: Glucose-Capillary: 121 mg/dL — ABNORMAL HIGH (ref 65–99)

## 2016-02-20 LAB — APTT: aPTT: 32 seconds (ref 24–36)

## 2016-02-20 LAB — LACTIC ACID, PLASMA: Lactic Acid, Venous: 2.3 mmol/L (ref 0.5–1.9)

## 2016-02-20 LAB — PROTIME-INR
INR: 1.28
Prothrombin Time: 16.1 seconds — ABNORMAL HIGH (ref 11.4–15.2)

## 2016-02-20 LAB — CBG MONITORING, ED: GLUCOSE-CAPILLARY: 223 mg/dL — AB (ref 65–99)

## 2016-02-20 MED ORDER — TRAZODONE HCL 50 MG PO TABS
25.0000 mg | ORAL_TABLET | Freq: Every evening | ORAL | Status: DC | PRN
  Administered 2016-02-22: 25 mg via ORAL
  Filled 2016-02-20: qty 1

## 2016-02-20 MED ORDER — MAGNESIUM CITRATE PO SOLN: 1.0000 | Freq: Once | ORAL | Status: DC | PRN

## 2016-02-20 MED ORDER — SENNOSIDES-DOCUSATE SODIUM 8.6-50 MG PO TABS: 1.0000 | ORAL_TABLET | Freq: Every evening | ORAL | Status: DC | PRN

## 2016-02-20 MED ORDER — SODIUM CHLORIDE 0.9 % IV SOLN: Freq: Once | INTRAVENOUS | Status: DC

## 2016-02-20 MED ORDER — HYDRALAZINE HCL 50 MG PO TABS
50.0000 mg | ORAL_TABLET | Freq: Four times a day (QID) | ORAL | Status: DC
  Administered 2016-02-20 – 2016-02-21 (×2): 50 mg via ORAL
  Filled 2016-02-20 (×2): qty 1

## 2016-02-20 MED ORDER — ISOSORBIDE MONONITRATE ER 60 MG PO TB24
120.0000 mg | ORAL_TABLET | Freq: Every day | ORAL | Status: DC
  Administered 2016-02-21 – 2016-02-24 (×4): 120 mg via ORAL
  Filled 2016-02-20 (×4): qty 2

## 2016-02-20 MED ORDER — SODIUM CHLORIDE 0.9% FLUSH
3.0000 mL | Freq: Two times a day (BID) | INTRAVENOUS | Status: DC
  Administered 2016-02-20 – 2016-02-24 (×7): 3 mL via INTRAVENOUS

## 2016-02-20 MED ORDER — ATORVASTATIN CALCIUM 40 MG PO TABS
80.0000 mg | ORAL_TABLET | Freq: Every day | ORAL | Status: DC
  Administered 2016-02-20 – 2016-02-23 (×4): 80 mg via ORAL
  Filled 2016-02-20: qty 1
  Filled 2016-02-20 (×3): qty 2

## 2016-02-20 MED ORDER — CLONIDINE HCL 0.1 MG PO TABS
0.1000 mg | ORAL_TABLET | Freq: Two times a day (BID) | ORAL | Status: DC
  Administered 2016-02-20 – 2016-02-21 (×2): 0.1 mg via ORAL
  Filled 2016-02-20 (×2): qty 1

## 2016-02-20 MED ORDER — PANTOPRAZOLE SODIUM 40 MG PO TBEC
40.0000 mg | DELAYED_RELEASE_TABLET | Freq: Every day | ORAL | Status: DC
  Administered 2016-02-21 – 2016-02-24 (×4): 40 mg via ORAL
  Filled 2016-02-20 (×4): qty 1

## 2016-02-20 MED ORDER — SODIUM CHLORIDE 0.9 % IV SOLN
Freq: Once | INTRAVENOUS | Status: AC
  Administered 2016-02-22: 10:00:00 via INTRAVENOUS

## 2016-02-20 MED ORDER — TAMSULOSIN HCL 0.4 MG PO CAPS
0.4000 mg | ORAL_CAPSULE | Freq: Every day | ORAL | Status: DC
  Administered 2016-02-21 – 2016-02-24 (×4): 0.4 mg via ORAL
  Filled 2016-02-20 (×4): qty 1

## 2016-02-20 MED ORDER — SODIUM BICARBONATE 650 MG PO TABS
650.0000 mg | ORAL_TABLET | Freq: Two times a day (BID) | ORAL | Status: DC
  Administered 2016-02-20 – 2016-02-24 (×8): 650 mg via ORAL
  Filled 2016-02-20 (×8): qty 1

## 2016-02-20 MED ORDER — AMIODARONE HCL 100 MG PO TABS
100.0000 mg | ORAL_TABLET | Freq: Every day | ORAL | Status: DC
  Administered 2016-02-21 – 2016-02-24 (×4): 100 mg via ORAL
  Filled 2016-02-20 (×4): qty 1

## 2016-02-20 MED ORDER — SODIUM CHLORIDE 0.9 % IV SOLN
INTRAVENOUS | Status: DC
  Administered 2016-02-20 – 2016-02-21 (×3): via INTRAVENOUS

## 2016-02-20 MED ORDER — BISACODYL 10 MG RE SUPP: 10.0000 mg | Freq: Every day | RECTAL | Status: DC | PRN

## 2016-02-20 MED ORDER — CARVEDILOL 25 MG PO TABS
25.0000 mg | ORAL_TABLET | Freq: Two times a day (BID) | ORAL | Status: DC
  Administered 2016-02-21: 25 mg via ORAL
  Filled 2016-02-20: qty 1

## 2016-02-20 MED ORDER — CALCIFEDIOL ER 30 MCG PO CPCR
30.0000 ug | ORAL_CAPSULE | Freq: Every day | ORAL | Status: DC
  Administered 2016-02-21 – 2016-02-23 (×4): 30 ug via ORAL
  Filled 2016-02-20 (×8): qty 1

## 2016-02-20 MED ORDER — ONDANSETRON HCL 4 MG PO TABS: 4.0000 mg | ORAL_TABLET | Freq: Four times a day (QID) | ORAL | Status: DC | PRN

## 2016-02-20 MED ORDER — ACETAMINOPHEN 325 MG PO TABS
650.0000 mg | ORAL_TABLET | Freq: Four times a day (QID) | ORAL | Status: DC | PRN
  Administered 2016-02-24: 650 mg via ORAL
  Filled 2016-02-20: qty 2

## 2016-02-20 MED ORDER — FUROSEMIDE 40 MG PO TABS
80.0000 mg | ORAL_TABLET | Freq: Every day | ORAL | Status: DC
  Administered 2016-02-21 – 2016-02-24 (×4): 80 mg via ORAL
  Filled 2016-02-20: qty 2
  Filled 2016-02-20: qty 1
  Filled 2016-02-20 (×2): qty 2

## 2016-02-20 MED ORDER — HYDROCODONE-ACETAMINOPHEN 5-325 MG PO TABS
1.0000 | ORAL_TABLET | ORAL | Status: DC | PRN
  Administered 2016-02-20: 1 via ORAL
  Administered 2016-02-21 – 2016-02-23 (×5): 2 via ORAL
  Filled 2016-02-20 (×5): qty 2
  Filled 2016-02-20: qty 1

## 2016-02-20 MED ORDER — ONDANSETRON HCL 4 MG/2ML IJ SOLN: 4.0000 mg | Freq: Four times a day (QID) | INTRAMUSCULAR | Status: DC | PRN

## 2016-02-20 MED ORDER — INSULIN ASPART 100 UNIT/ML ~~LOC~~ SOLN
0.0000 [IU] | Freq: Three times a day (TID) | SUBCUTANEOUS | Status: DC
  Administered 2016-02-21 – 2016-02-24 (×5): 1 [IU] via SUBCUTANEOUS
  Administered 2016-02-24: 2 [IU] via SUBCUTANEOUS

## 2016-02-20 MED ORDER — ACETAMINOPHEN 650 MG RE SUPP: 650.0000 mg | Freq: Four times a day (QID) | RECTAL | Status: DC | PRN

## 2016-02-20 NOTE — ED Provider Notes (Signed)
[facility]EM[facility]Y DEPT Provider Note   CSN: [id number] Arrival date & time: 02/20/16  1420  First Provider Contact:  First MD Initiated Contact with Patient 02/20/16 1502      An emergency department physician performed an initial assessment on this suspected stroke patient at 1421.  History   Chief Complaint Chief Complaint  Patient presents with  . Code Stroke    HPI [name]. is a 80 y.o. male.  HPI 80 yo M with extensive PMHx including HTN, HLD, CKD, MDS and chronic anemia, prostate CA s/p radiation and chemo, who presents with seizure and possible CVA. Per report from EMS, pt is at [facility] and was undergoing rehab when he had a brief, <5 minute GTC seizure. Afterwards, he was confused with left-sided facial droop and left arm weakness. EMS called and brought immediately to ED. On arrival, pt denies complaints. He states he feels "worn out" but denies any HA. No recent falls.  Remainder of history, ROS, and physical exam limited due to patient's acuity, Code Stroke, and fatigue.   Past Medical History:  Diagnosis Date  . Angiomyolipoma 2009   On both kidneys noted in 2009  . Arthritis    neck and left wrist  . Atrial fibrillation (HCC)   . CAD (coronary artery disease)    s/b CABG 1994, and subsequent stents. Repeat CABG 12/2011,  . Chronic diastolic heart failure (HCC)   . CKD (chronic kidney disease)   . Depressive disorder   . Diabetes mellitus type 2 with peripheral artery disease (HCC)    DIET CONTROLLED  . DVT (deep venous thrombosis) (HCC) 2011   Right arm  . Gastroparesis   . GERD (gastroesophageal reflux disease)   . Gout   . History of hiatal hernia   . Hyperlipidemia   . Hypertension   . Internal hemorrhoids without mention of complication   . Ischemic colitis (HCC)   . Liddle's syndrome (HCC)   . Myelodysplastic syndrome (HCC) 05/22/2013   With low hemoglobin and platelets treated with Procrit  . Osteopenia   . Peptic ulcer    S/p partial  gastrectomy in 1969  . Peripheral artery disease (HCC)   . Pneumonia 01/16/2016  . Presence of permanent cardiac pacemaker   . Prostate cancer (HCC) 1997   XRT and lupron  . Renal artery stenosis (HCC)   . Sick sinus syndrome (HCC)   . Vitamin B 12 deficiency     Patient Active Problem List   Diagnosis Date Noted  . Seizures (HCC) 02/20/2016  . Palliative care encounter   . Goals of care, counseling/discussion   . Arterial hypotension   . Pacemaker infection (HCC)   . Acute renal failure superimposed on stage 4 chronic kidney disease (HCC) 01/26/2016  . Bacteremia due to Staphylococcus aureus   . Pseudogout involving multiple joints 01/24/2016  . Bladder mass 01/24/2016  . Hemoptysis   . Pain   . Wrist swelling   . Staphylococcus aureus bacteremia 01/17/2016  . CAP (community acquired pneumonia) 01/16/2016  . S/P hernia repair 10/28/2015  . Right inguinal hernia 09/09/2015  . GIB (gastrointestinal bleeding) 08/29/2015  . Hematuria 05/19/2015  . Thrombocytopenia (HCC) 11/11/2014  . Leukopenia 11/11/2014  . B12 deficiency 08/31/2013  . Pacemaker 08/26/2013  . On amiodarone therapy 08/21/2013  . Gout due to renal impairment, left ankle and foot 06/13/2013    Class: Acute  . CAD of autologous bypass graft 06/05/2013  . MDS (myelodysplastic syndrome) (HCC) 05/22/2013  . Anemia  of chronic disease 03/19/2013  . CKD (chronic kidney disease), stage IV ([place]) 03/18/2013  . NSVT (nonsustained ventricular tachycardia) ([place]) 03/18/2013  . Chronic diastolic heart failure ([place]) 03/17/2013  . Diabetes mellitus ([place]) 03/17/2013  . Essential hypertension 03/17/2013  . Mesenteric artery stenosis ([place]) 05/01/2012  . PAF (paroxysmal atrial fibrillation) ([place]) 12/24/2011  . PAD (peripheral artery disease) ([place]) 12/24/2011  . Chronic vascular insufficiency of intestine ([place]) 12/20/2011    Past Surgical History:  Procedure Laterality Date  . ABDOMINAL ANGIOGRAM N/A 05/25/2011    Procedure: ABDOMINAL ANGIOGRAM;  Surgeon: Serafina Mitchell, MD;  Location: [facility] CATH LAB;  Service: Cardiovascular;  Laterality: N/A;  . ABDOMINAL ANGIOGRAM N/A 02/13/2013   Procedure: ABDOMINAL ANGIOGRAM;  Surgeon: Serafina Mitchell, MD;  Location: [facility] CATH LAB;  Service: Cardiovascular;  Laterality: N/A;  . APPENDECTOMY  1991  . CELIAC ARTERY ANGIOPLASTY  05-16-12   and stenting  . CHOLECYSTECTOMY  Oct 2009   Laparoscopic  . CORONARY ARTERY BYPASS GRAFT  01/22/1993  . CORONARY ARTERY BYPASS GRAFT  01/03/2012   Procedure: REDO CORONARY ARTERY BYPASS GRAFTING (CABG);  Surgeon: Gaye Pollack, MD;  Location: [place];  Service: Open Heart Surgery;  Laterality: N/A;  Redo CABG x  using bilateral internal mammary arteries;  left leg greater saphenous vein harvested endoscopically  . FOREIGN BODY REMOVAL ABDOMINAL Right 01/27/2016   Procedure: EXPOSURE OF RIGHT COMMON FEMORAL ARTERY AND REMOVAL FOREIGN;  Surgeon: Evans Lance, MD;  Location: [place];  Service: Cardiovascular;  Laterality: Right;  . HIATAL HERNIA REPAIR     and ulcer repair  . I&D EXTREMITY Left 01/21/2016   Procedure: IRRIGATION AND DEBRIDEMENT EXTREMITY;  Surgeon: Milly Jakob, MD;  Location: [place];  Service: Orthopedics;  Laterality: Left;  . INGUINAL HERNIA REPAIR Right 10/28/2015   Procedure: OPEN RIGHT INGUINAL HERNIA REPAIR;  Surgeon: Greer Pickerel, MD;  Location: [facility];  Service: General;  Laterality: Right;  . INSERTION OF MESH Right 10/28/2015   Procedure: INSERTION OF MESH;  Surgeon: Greer Pickerel, MD;  Location: [facility];  Service: General;  Laterality: Right;  . IR GENERIC HISTORICAL  02/02/2016   IR FLUORO GUIDE CV LINE LEFT 02/02/2016 Markus Daft, MD [facility]  . IR GENERIC HISTORICAL  02/02/2016   IR US GUIDE VASC ACCESS LEFT 02/02/2016 Markus Daft, MD [facility]  . LEFT HEART CATHETERIZATION WITH CORONARY/GRAFT ANGIOGRAM  12/24/2011   Procedure: LEFT HEART CATHETERIZATION WITH Beatrix Fetters;  Surgeon: Sinclair Grooms, MD;   Location: [facility] CATH LAB;  Service: Cardiovascular;;  . LOWER EXTREMITY ANGIOGRAM Bilateral 05/25/2011   Procedure: LOWER EXTREMITY ANGIOGRAM;  Surgeon: Serafina Mitchell, MD;  Location: [facility] CATH LAB;  Service: Cardiovascular;  Laterality: Bilateral;  . OTHER SURGICAL HISTORY  02/13/13   superior mesenteric artery angiogram  . OTHER SURGICAL HISTORY  05/16/12   Stent in stomach  . PACEMAKER GENERATOR CHANGE  12/10/2003   SJM Identity XL DR performed by Dr Leonia Reeves  . PACEMAKER INSERTION  10/18/1994   DDD pacemaker, [employer]. Gen change 12/10/2003.  Marland Kitchen PACEMAKER LEAD REMOVAL Right 01/27/2016   Procedure: PACEMAKER EXTRACTION;  Surgeon: Evans Lance, MD;  Location: [place];  Service: Cardiovascular;  Laterality: Right;  DR. Roxy Manns TO BACKUP CASE  . PARTIAL GASTRECTOMY  1969   Hx of ulcer s/p partial gastrectomy/ has pernicious anemia  . RENAL ANGIOGRAM N/A 02/13/2013   Procedure: RENAL ANGIOGRAM;  Surgeon: Serafina Mitchell, MD;  Location: [facility] CATH LAB;  Service: Cardiovascular;  Laterality: N/A;  . TEE WITHOUT CARDIOVERSION N/A 01/26/2016   Procedure: TRANSESOPHAGEAL ECHOCARDIOGRAM (TEE);  Surgeon: Sueanne Margarita, MD;  Location: [facility] ENDOSCOPY;  Service: Cardiovascular;  Laterality: N/A;  . TEE WITHOUT CARDIOVERSION N/A 01/27/2016   Procedure: TRANSESOPHAGEAL ECHOCARDIOGRAM (TEE);  Surgeon: Evans Lance, MD;  Location: [place];  Service: Cardiovascular;  Laterality: N/A;  . THROMBECTOMY / EMBOLECTOMY SUBCLAVIAN ARTERY  02/02/10   Right subclavian thromboectomy and venous angioplasty, and chronic mesenteric ischemia with Herculink stenting to superior mesenteric and celiac arteries - Dr. Trula Slade  . VISCERAL ANGIOGRAM N/A 05/25/2011   Procedure: Laurita Quint;  Surgeon: Serafina Mitchell, MD;  Location: [facility] CATH LAB;  Service: Cardiovascular;  Laterality: N/A;  . VISCERAL ANGIOGRAM Bilateral 12/28/2011   Procedure: VISCERAL ANGIOGRAM;  Surgeon: Serafina Mitchell, MD;  Location: [facility] CATH LAB;  Service: Cardiovascular;   Laterality: Bilateral;  . VISCERAL ANGIOGRAM N/A 05/16/2012   Procedure: VISCERAL ANGIOGRAM;  Surgeon: Serafina Mitchell, MD;  Location: [facility] CATH LAB;  Service: Cardiovascular;  Laterality: N/A;  . VISCERAL ANGIOGRAM N/A 02/13/2013   Procedure: VISCERAL ANGIOGRAM;  Surgeon: Serafina Mitchell, MD;  Location: [facility] CATH LAB;  Service: Cardiovascular;  Laterality: N/A;       Home Medications    Prior to Admission medications   Medication Sig Start Date End Date Taking? Authorizing Provider  acetaminophen (ARTHRITIS PAIN RELIEF) 650 MG CR tablet Take 650 mg by mouth 3 (three) times daily.    Yes Historical Provider, MD  amiodarone (PACERONE) 200 MG tablet Take one-half tablet by  mouth daily Patient taking differently: Take 100 mg by mouth daily 01/07/16  Yes Belva Crome, MD  atorvastatin (LIPITOR) 80 MG tablet Take 80 mg by mouth at bedtime.    Yes Historical Provider, MD  Calcifediol ER (RAYALDEE) 30 MCG CPCR Take 30 mcg by mouth at bedtime.   Yes Historical Provider, MD  carvedilol (COREG) 25 MG tablet TAKE 1 TABLET BY MOUTH  TWICE DAILY WITH MEALS 01/07/16  Yes Belva Crome, MD  cloNIDine (CATAPRES) 0.1 MG tablet Take 0.1 mg by mouth 2 (two) times daily. 02/19/15  Yes Historical Provider, MD  cyanocobalamin (,VITAMIN B-12,) 1000 MCG/ML injection Inject 1,000 mcg into the muscle every 30 (thirty) days. Vitamin B12 - last injection 09/01/15   Yes Historical Provider, MD  epoetin alfa (EPOGEN,PROCRIT) 75643 UNIT/ML injection Inject 10,000 Units into the skin every 14 (fourteen) days. Done at [facility] - last injection 09/22/15   Yes Historical Provider, MD  furosemide (LASIX) 80 MG tablet Take 1 tablet (80 mg total) by mouth daily. Pt takes 1 and 1/2 tablet daily. (140 mg) total Patient taking differently: Take 140 mg by mouth every morning.  02/03/16  Yes Albertine Patricia, MD  hydrALAZINE (APRESOLINE) 50 MG tablet Take 1 tablet (50 mg total) by mouth 3 (three) times daily. Patient taking  differently: Take 50 mg by mouth 4 (four) times daily. HOLD IF SBP <110 11/23/13  Yes Belva Crome, MD  insulin aspart (NOVOLOG) 100 UNIT/ML injection Inject 0-9 Units into the skin 3 (three) times daily with meals. Patient taking differently: Inject 5-10 Units into the skin See admin instructions. Three times daily per sliding scale: BGL 150-250 = 5 units; 251-300 = 8 units; 301-350 = 10 units; >350 = CALL MD/NP 02/03/16  Yes Albertine Patricia, MD  isosorbide mononitrate (IMDUR) 120 MG 24 hr tablet Take 1 tablet by mouth  daily 01/07/16  Yes Belva Crome, MD  leuprolide (LUPRON) 30 MG injection Inject 30 mg into the muscle every 4 (four) months.   Yes Historical Provider, MD  Multiple Vitamin (MULTIVITAMIN WITH MINERALS) TABS tablet Take 1 tablet by mouth every morning.   Yes Historical Provider, MD  oxyCODONE-acetaminophen (PERCOCET/ROXICET) 5-325 MG tablet Take 1 tablet by mouth every 6 (six) hours as needed for severe pain. Patient taking differently: Take 1 tablet by mouth every 4 (four) hours as needed for severe pain.  02/03/16  Yes Leana Roe Elgergawy, MD  pantoprazole (PROTONIX) 40 MG tablet Take 1 tablet by mouth daily. 01/14/16  Yes Historical Provider, MD  potassium chloride SA (K-DUR,KLOR-CON) 20 MEQ tablet Take 1.5 tablets (30 mEq total) by mouth daily. 02/03/16  Yes Starleen Arms, MD  silodosin (RAPAFLO) 8 MG CAPS capsule Take 8 mg by mouth at bedtime.    Yes Historical Provider, MD  sodium bicarbonate 650 MG tablet Take 1 tablet (650 mg total) by mouth 2 (two) times daily. 02/03/16  Yes Starleen Arms, MD  UNABLE TO FIND Med Name: Med pass 120 mL 3 times daily   Yes Historical Provider, MD  ceFAZolin (ANCEF) 1 GM/50ML Inject 50 mLs (1 g total) into the vein every 12 (twelve) hours. To continue through 02/12/2016 02/03/16   Leana Roe Elgergawy, MD  insulin aspart (NOVOLOG) 100 UNIT/ML injection Inject 0-5 Units into the skin at bedtime. Patient not taking: Reported on 02/20/2016 02/03/16    Leana Roe Elgergawy, MD  predniSONE (DELTASONE) 10 MG tablet Please use 40 mg oral daily for 3 days, then 30 mg oral daily for 3 days, then 20 mg oral daily for 3 days, then 10 mg oral daily for 3 days 02/03/16   Starleen Arms, MD  rifampin (RIFADIN) 300 MG capsule Take 600 mg by mouth 2 (two) times daily.    Historical Provider, MD  senna-docusate (SENOKOT-S) 8.6-50 MG tablet Take 2 tablets by mouth 2 (two) times daily. 02/03/16   Starleen Arms, MD    Family History Family History  Problem Relation Age of Onset  . Diabetes Mother   . Heart disease Mother     Heart Disease before age 63  . Hyperlipidemia Mother   . Hypertension Mother   . CVA Mother 63    cause of death  . Cancer Father     stomach/liver  . Hypertension Father     possibly hypertensive  . Cancer Sister     Breast cancer  . CAD Brother   . Heart disease Brother   . Cancer Paternal Uncle     colon  . Heart disease Sister   . Diabetes Sister   . Hypertension Sister   . Heart disease Daughter     Heart Disease before age 10  . Hypertension Daughter   . Heart attack Daughter     Social History Social History  Substance Use Topics  . Smoking status: Former Smoker    Years: 20.00    Types: Cigarettes    Quit date: 07/12/1969  . Smokeless tobacco: Never Used  . Alcohol use No     Allergies   Nsaids; Ibuprofen; and Ace inhibitors   Review of Systems Review of Systems  Constitutional: Positive for fatigue. Negative for chills and fever.  HENT: Negative for congestion and rhinorrhea.   Eyes: Negative for visual disturbance.  Respiratory: Negative for cough, shortness of breath and wheezing.   Cardiovascular: Negative for chest pain and leg swelling.  Gastrointestinal: Negative for abdominal pain, diarrhea,  nausea and vomiting.  Genitourinary: Negative for dysuria and flank pain.  Musculoskeletal: Negative for neck pain and neck stiffness.  Skin: Negative for rash and wound.    Allergic/Immunologic: Negative for immunocompromised state.  Neurological: Positive for seizures and weakness. Negative for syncope and headaches.     Physical Exam Updated Vital Signs BP 144/64   Pulse 79   Temp 99 F (37.2 C) (Oral)   Resp 17   Ht '5\' 10"'$  (1.778 m)   SpO2 99%   Physical Exam  Constitutional: He is oriented to person, place, and time. He appears well-developed. No distress.  Markedly fatigued appearing  HENT:  Head: Normocephalic and atraumatic.  Mouth/Throat: Oropharynx is clear and moist.  Eyes: Conjunctivae are normal. Pupils are equal, round, and reactive to light.  Neck: Neck supple.  Cardiovascular: Normal rate, regular rhythm and normal heart sounds.  Exam reveals no friction rub.   No murmur heard. Pulmonary/Chest: Effort normal and breath sounds normal. No respiratory distress. He has no wheezes. He has no rales.  Abdominal: He exhibits no distension. There is no tenderness.  Genitourinary:  Genitourinary Comments: Foley in place draining grossly bloody urine  Musculoskeletal: He exhibits no edema.  Neurological: He is alert and oriented to person, place, and time. He exhibits normal muscle tone.  Skin: Skin is warm. Capillary refill takes less than 2 seconds.  Nursing note and vitals reviewed.   Neurological Exam:  Mental Status: Alert and oriented to person, place, and time. Attention and concentration normal. Speech clear. Recent memory is intact. Cranial Nerves: Visual fields intact to confrontation in all quadrants bilaterally. EOMI and PERRLA. No nystagmus noted. Facial sensation intact at forehead, maxillary cheek, and chin/mandible bilaterally. No weakness of masticatory muscles. No facial asymmetry or weakness. Hearing grossly normal to finer rub. Uvula is midline, and palate elevates symmetrically. Normal SCM and trapezius strength. Tongue midline without fasciculations Motor: Muscle strength 5/5 in proximal and distal UE and LE bilaterally.  No pronator drift. Muscle tone normal. Reflexes: 2+ and symmetrical in all four extremities.  Sensation: Intact to light touch in upper and lower extremities distally bilaterally.  Gait: Normal without ataxia. Coordination: Normal FTN bilaterally.   ED Treatments / Results  Labs (all labs ordered are listed, but only abnormal results are displayed) Labs Reviewed  PROTIME-INR - Abnormal; Notable for the following:       Result Value   Prothrombin Time 16.1 (*)    All other components within normal limits  COMPREHENSIVE METABOLIC PANEL - Abnormal; Notable for the following:    CO2 21 (*)    Glucose, Bld 228 (*)    BUN 35 (*)    Creatinine, Ser 2.27 (*)    Calcium 7.7 (*)    Total Protein 5.4 (*)    Albumin 2.2 (*)    ALT 6 (*)    Total Bilirubin 0.2 (*)    GFR calc non Af Amer 26 (*)    GFR calc Af Amer 30 (*)    All other components within normal limits  CBC WITH DIFFERENTIAL/PLATELET - Abnormal; Notable for the following:    RBC 1.08 (*)    Hemoglobin 3.4 (*)    HCT 12.2 (*)    MCV 113.0 (*)    MCHC 27.9 (*)    RDW 24.3 (*)    Platelets 113 (*)    All other components within normal limits  TROPONIN I - Abnormal; Notable for the following:    Troponin I 0.04 (*)  All other components within normal limits  LACTIC ACID, PLASMA - Abnormal; Notable for the following:    Lactic Acid, Venous 2.3 (*)    All other components within normal limits  CBG MONITORING, ED - Abnormal; Notable for the following:    Glucose-Capillary 223 (*)    All other components within normal limits  I-STAT VENOUS BLOOD GAS, ED - Abnormal; Notable for the following:    pH, Ven 7.429 (*)    pCO2, Ven 34.8 (*)    pO2, Ven 24.0 (*)    All other components within normal limits  CULTURE, BLOOD (ROUTINE X 2)  CULTURE, BLOOD (ROUTINE X 2)  URINE CULTURE  APTT  COMPREHENSIVE METABOLIC PANEL  CBC  PROTIME-INR  HEMOGLOBIN A1C  TROPONIN I  TROPONIN I  LACTIC ACID, PLASMA  I-STAT TROPOININ, ED    CBG MONITORING, ED  I-STAT CHEM 8, ED  TYPE AND SCREEN  PREPARE RBC (CROSSMATCH)  PREPARE RBC (CROSSMATCH)    EKG  EKG Interpretation  Date/Time:  Friday February 20 2016 14:43:01 EDT Ventricular Rate:  80 PR Interval:    QRS Duration: 108 QT Interval:  384 QTC Calculation: 443 R Axis:   51 Text Interpretation:  Sinus rhythm Repol abnrm, probable ischemia, inferior lds Borderline ST elevation, anterior leads No significant change since last tracing Baseline wander persists Repeat EKG ordered Confirmed by Rozelle Caudle MD, Danyle Boening [phone]) on 02/21/2016 11:33:25 AM       Radiology Dg Chest Portable 1 View  Result Date: 02/20/2016 CLINICAL DATA:  Stroke. EXAM: PORTABLE CHEST 1 VIEW COMPARISON:  01/28/2016 FINDINGS: Status post median sternotomy. Left-sided PICC line tip overlies the level of the lower superior vena cava. The heart is enlarged. There is streaky atelectasis in the right perihilar region and left lung base. No focal consolidations or evidence for pulmonary edema. IMPRESSION: Cardiomegaly without pulmonary edema. Bibasilar atelectasis. Electronically Signed   By: Nolon Nations M.D.   On: 02/20/2016 17:43   Ct Head Code Stroke W/o Cm  Result Date: 02/20/2016 CLINICAL DATA:  Code stroke.  Left facial droop.  Blown pupil. EXAM: CT HEAD WITHOUT CONTRAST TECHNIQUE: Contiguous axial images were obtained from the base of the skull through the vertex without intravenous contrast. COMPARISON:  CT head 04/03/2014 FINDINGS: Generalized atrophy. Hypodensity in the periventricular white matter bilaterally similar to the prior study and consistent with chronic microvascular ischemia. Negative for acute infarct. Negative for dense MCA. Atherosclerotic calcification in the vertebral and carotid arteries bilaterally. Negative for hemorrhage or mass. No shift of the midline structures. Negative calvarium. ASPECTS [facility] Stroke Program Early CT Score, [email or web address].[email or web address]) - Ganglionic  level infarction (caudate, lentiform nuclei, internal capsule, insula, M1-M3 cortex): 7 - Supraganglionic infarction (M4-M6 cortex): 3 Total score (0-10 with 10 being normal): 10 IMPRESSION: 1. No acute intracranial abnormality. Atrophy and chronic ischemic changes. 2. ASPECTS score 10 These results were called by telephone at the time of interpretation on 02/20/2016 at 2:41 pm to Dr. Tasia Catchings, who verbally acknowledged these results. Electronically Signed   By: Franchot Gallo M.D.   On: 02/20/2016 14:45    Procedures .Critical Care Performed by: Duffy Bruce Authorized by: Duffy Bruce   Critical care provider statement:    Critical care time (minutes):  35   Critical care time was exclusive of:  Separately billable procedures and treating other patients   Critical care was necessary to treat or prevent imminent or life-threatening deterioration of the following conditions:  Circulatory failure and CNS failure or compromise  Critical care was time spent personally by me on the following activities:  Ordering and performing treatments and interventions, development of treatment plan with patient or surrogate, ordering and review of laboratory studies, ordering and review of radiographic studies, pulse oximetry, re-evaluation of patient's condition, review of old charts, obtaining history from patient or surrogate, discussions with consultants, evaluation of patient's response to treatment and examination of patient   I assumed direction of critical care for this patient from another provider in my specialty: no     (including critical care time)  Medications Ordered in ED Medications  0.9 %  sodium chloride infusion ( Intravenous New Bag/Given 02/20/16 1739)  acetaminophen (TYLENOL) tablet 650 mg (not administered)    Or  acetaminophen (TYLENOL) suppository 650 mg (not administered)  HYDROcodone-acetaminophen (NORCO/VICODIN) 5-325 MG per tablet 1-2 tablet (not administered)  traZODone  (DESYREL) tablet 25 mg (not administered)  senna-docusate (Senokot-S) tablet 1 tablet (not administered)  bisacodyl (DULCOLAX) suppository 10 mg (not administered)  magnesium citrate solution 1 Bottle (not administered)  ondansetron (ZOFRAN) tablet 4 mg (not administered)    Or  ondansetron (ZOFRAN) injection 4 mg (not administered)  sodium chloride flush (NS) 0.9 % injection 3 mL (not administered)  insulin aspart (novoLOG) injection 0-9 Units (not administered)  furosemide (LASIX) tablet 80 mg (not administered)  pantoprazole (PROTONIX) EC tablet 40 mg (not administered)  sodium bicarbonate tablet 650 mg (not administered)  amiodarone (PACERONE) tablet 100 mg (not administered)  carvedilol (COREG) tablet 25 mg (not administered)  isosorbide mononitrate (IMDUR) 24 hr tablet 120 mg (not administered)  Calcifediol ER CPCR 30 mcg (not administered)  cloNIDine (CATAPRES) tablet 0.1 mg (not administered)  hydrALAZINE (APRESOLINE) tablet 50 mg (not administered)  tamsulosin (FLOMAX) capsule 0.4 mg (not administered)  atorvastatin (LIPITOR) tablet 80 mg (not administered)  0.9 %  sodium chloride infusion (not administered)  0.9 %  sodium chloride infusion (not administered)     Initial Impression / Assessment and Plan / ED Course  I have reviewed the triage vital signs and the nursing notes.  Pertinent labs & imaging results that were available during my care of the patient were reviewed by me and considered in my medical decision making (see chart for details).  Clinical Course   80 yo M with extensive PMHx who presents with new onset seizure with possible facial droop. On arrival, pt met at ambulance bay. He is fatigued but protecting airway. He was immediately taken to CT scanner with Dr. Tasia Catchings of Neurology as a code stroke. CT Head shows NAICA. Reported facial droop and weakness is now resolved on my exam. Suspect seizure with possible post-ictal Todd's paralysis now resolved. Not  tPA candidate. However, on full lab work, Hgb noted to be severely low at 3.4. This lab work was initially delayed due to lab requesting additional sample due to presumed error. Repeat is consistent with this value however. Suspect this is 2/2 ongoing hematuria with baseline anemia. Given marked anemia, suspect pt may have had transient ischemia 2/2 hypoperfusion in setting of exertion with severe anemia .Will give 1u emergent release followed by 2u matched pRBC and admit to hospitlaist. Urology consulted for hematuria. Pt remains HDS.  Final Clinical Impressions(s) / ED Diagnoses   Final diagnoses:  Severe anemia  New onset seizure ([place])  Gross hematuria    New Prescriptions New Prescriptions   No medications on file     Duffy Bruce, MD 02/21/16 1137

## 2016-02-20 NOTE — Progress Notes (Signed)
Pt arrived floor. Baltazar Najjar, NP was paged to clarify blood transfusion order.  Pt's had a few NSB rhythm.Pt asleep,getting blood & cont bladder irrigation.Tresa Moore, MD aware during consultation. Donnal Debar, NP notified. She advised to keep watching it. Pt is sleeping. Will continue to monitor.

## 2016-02-20 NOTE — Consult Note (Signed)
Reason for Consult: Hematuria, Prostate Cancer, Urinary Retention, Chronic Renal Failure  Referring Physician: Duffy Bruce MD  [name] Marble Sr. is an 80 y.o. male.   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  Eval 2017 with CT and cysto w/o upper tract masses or bladder tumors. He is on plavix for CAD/PAD and h/o MDS. Has been considering hyperbaric O2 but not initiated. Plavix being held in house.   2 - Recurrent Prostate Cancer - s/p external beam radiation for unknwon grade / stage disease previously. Biochemical recurrence and on androgen deprivation continuously 2016 with PSA 0.04 most recently.   3 - Urinary Retention - recurrent urinary retention 2017, now managed with indwelling catheter.  4 - Stage 4 Renal Insufficiency - Cr 2.5-5 2017. Imaging x several w/o hydro. He has advanced vascular disease and diabetes.   Today "Guido" is seen as urgent consult for above. Aparantly an "urgent consult" was reqeusted at 5pm today, but there was no notification or calling of the consult. Hospitalist NP formally (and for the first time) made me aware of the consult in late PM. He is currently being agressively transfused for suspect severe anemia (Hgb 3.4). He normally follows with Dr Alyson Ingles in our group. His Hgb was 8.2 about 3 weeks ago.     Past Medical History:  Diagnosis Date  . Angiomyolipoma 2009   On both kidneys noted in 2009  . Arthritis    neck and left wrist  . Atrial fibrillation ([place])   . CAD (coronary artery disease)    s/b CABG 1994, and subsequent stents. Repeat CABG 12/2011,  . Chronic diastolic heart failure ([place])   . CKD (chronic kidney disease)   . Depressive disorder   . Diabetes mellitus type 2 with peripheral artery disease ([facility])    DIET CONTROLLED  . DVT (deep venous thrombosis) ([place]) 2011   Right arm  . Gastroparesis   . GERD (gastroesophageal reflux  disease)   . Gout   . History of hiatal hernia   . Hyperlipidemia   . Hypertension   . Internal hemorrhoids without mention of complication   . Ischemic colitis ([place])   . 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 Myelodysplastic syndrome ([place]) 05/22/2013   With low hemoglobin and platelets treated with Procrit  . Osteopenia   . Peptic ulcer    S/p partial gastrectomy in 1969  . Peripheral artery disease ([place])   . Pneumonia 01/16/2016  . Presence of permanent cardiac pacemaker   . Prostate cancer ([place]) 1997   XRT and lupron  . Renal artery stenosis ([place])   . Sick sinus syndrome ([place])   . Vitamin B 12 deficiency     Past Surgical History:  Procedure Laterality Date  . ABDOMINAL ANGIOGRAM N/A 05/25/2011   Procedure: ABDOMINAL ANGIOGRAM;  Surgeon: Serafina Mitchell, MD;  Location: [facility] CATH LAB;  Service: Cardiovascular;  Laterality: N/A;  . ABDOMINAL ANGIOGRAM N/A 02/13/2013   Procedure: ABDOMINAL ANGIOGRAM;  Surgeon: Serafina Mitchell, MD;  Location: [facility] CATH LAB;  Service: Cardiovascular;  Laterality: N/A;  . APPENDECTOMY  1991  . CELIAC ARTERY ANGIOPLASTY  05-16-12   and stenting  . CHOLECYSTECTOMY  Oct 2009   Laparoscopic  . CORONARY ARTERY BYPASS GRAFT  01/22/1993  . CORONARY ARTERY BYPASS GRAFT  01/03/2012   Procedure: REDO CORONARY ARTERY BYPASS GRAFTING (CABG);  Surgeon: Gaye Pollack, MD;  Location: [place];  Service: Open Heart Surgery;  Laterality: N/A;  Redo CABG x  using bilateral internal mammary arteries;  left leg greater saphenous vein harvested endoscopically  . FOREIGN BODY REMOVAL ABDOMINAL Right 01/27/2016   Procedure: EXPOSURE OF RIGHT COMMON FEMORAL ARTERY AND REMOVAL FOREIGN;  Surgeon: Evans Lance, MD;  Location: [place];  Service: Cardiovascular;  Laterality: Right;  . HIATAL HERNIA REPAIR     and ulcer repair  . I&D EXTREMITY Left 01/21/2016   Procedure: IRRIGATION AND DEBRIDEMENT EXTREMITY;  Surgeon: Milly Jakob, MD;  Location: [place];  Service: Orthopedics;  Laterality: Left;   . INGUINAL HERNIA REPAIR Right 10/28/2015   Procedure: OPEN RIGHT INGUINAL HERNIA REPAIR;  Surgeon: Greer Pickerel, MD;  Location: [facility];  Service: General;  Laterality: Right;  . INSERTION OF MESH Right 10/28/2015   Procedure: INSERTION OF MESH;  Surgeon: Greer Pickerel, MD;  Location: [facility];  Service: General;  Laterality: Right;  . IR GENERIC HISTORICAL  02/02/2016   IR FLUORO GUIDE CV LINE LEFT 02/02/2016 Markus Daft, MD [facility]  . IR GENERIC HISTORICAL  02/02/2016   IR US GUIDE VASC ACCESS LEFT 02/02/2016 Markus Daft, MD [facility]  . LEFT HEART CATHETERIZATION WITH CORONARY/GRAFT ANGIOGRAM  12/24/2011   Procedure: LEFT HEART CATHETERIZATION WITH Beatrix Fetters;  Surgeon: Sinclair Grooms, MD;  Location: [facility] CATH LAB;  Service: Cardiovascular;;  . LOWER EXTREMITY ANGIOGRAM Bilateral 05/25/2011   Procedure: LOWER EXTREMITY ANGIOGRAM;  Surgeon: Serafina Mitchell, MD;  Location: [facility] CATH LAB;  Service: Cardiovascular;  Laterality: Bilateral;  . OTHER SURGICAL HISTORY  02/13/13   superior mesenteric artery angiogram  . OTHER SURGICAL HISTORY  05/16/12   Stent in stomach  . PACEMAKER GENERATOR CHANGE  12/10/2003   SJM Identity XL DR performed by Dr Leonia Reeves  . PACEMAKER INSERTION  10/18/1994   DDD pacemaker, [employer]. Gen change 12/10/2003.  Marland Kitchen PACEMAKER LEAD REMOVAL Right 01/27/2016   Procedure: PACEMAKER EXTRACTION;  Surgeon: Evans Lance, MD;  Location: [place];  Service: Cardiovascular;  Laterality: Right;  DR. Roxy Manns TO BACKUP CASE  . PARTIAL GASTRECTOMY  1969   Hx of ulcer s/p partial gastrectomy/ has pernicious anemia  . RENAL ANGIOGRAM N/A 02/13/2013   Procedure: RENAL ANGIOGRAM;  Surgeon: Serafina Mitchell, MD;  Location: [facility] CATH LAB;  Service: Cardiovascular;  Laterality: N/A;  . TEE WITHOUT CARDIOVERSION N/A 01/26/2016   Procedure: TRANSESOPHAGEAL ECHOCARDIOGRAM (TEE);  Surgeon: Sueanne Margarita, MD;  Location: [facility]OSCOPY;  Service: Cardiovascular;  Laterality: N/A;  . TEE WITHOUT  CARDIOVERSION N/A 01/27/2016   Procedure: TRANSESOPHAGEAL ECHOCARDIOGRAM (TEE);  Surgeon: Evans Lance, MD;  Location: [place];  Service: Cardiovascular;  Laterality: N/A;  . THROMBECTOMY / EMBOLECTOMY SUBCLAVIAN ARTERY  02/02/10   Right subclavian thromboectomy and venous angioplasty, and chronic mesenteric ischemia with Herculink stenting to superior mesenteric and celiac arteries - Dr. Trula Slade  . VISCERAL ANGIOGRAM N/A 05/25/2011   Procedure: Laurita Quint;  Surgeon: Serafina Mitchell, MD;  Location: [facility] CATH LAB;  Service: Cardiovascular;  Laterality: N/A;  . VISCERAL ANGIOGRAM Bilateral 12/28/2011   Procedure: VISCERAL ANGIOGRAM;  Surgeon: Serafina Mitchell, MD;  Location: [facility] CATH LAB;  Service: Cardiovascular;  Laterality: Bilateral;  . VISCERAL ANGIOGRAM N/A 05/16/2012   Procedure: VISCERAL ANGIOGRAM;  Surgeon: Serafina Mitchell, MD;  Location: [facility] CATH LAB;  Service: Cardiovascular;  Laterality: N/A;  . VISCERAL ANGIOGRAM N/A 02/13/2013   Procedure: VISCERAL ANGIOGRAM;  Surgeon: Serafina Mitchell, MD;  Location: [facility] CATH LAB;  Service: Cardiovascular;  Laterality: N/A;    Family History  Problem Relation Age of Onset  . Diabetes Mother   . Heart disease Mother     Heart Disease before age 48  . Hyperlipidemia Mother   . Hypertension Mother   . CVA Mother 20    cause of death  . Cancer Father     stomach/liver  . Hypertension Father     possibly hypertensive  . Cancer Sister     Breast cancer  . CAD Brother   . Heart disease Brother   . Cancer Paternal Uncle     colon  . Heart disease Sister   . Diabetes Sister   . Hypertension Sister   . Heart disease Daughter     Heart Disease before age 5  . Hypertension Daughter   . Heart attack Daughter     Social History:  reports that he quit smoking about 46 years ago. His smoking use included Cigarettes. He quit after 20.00 years of use. He has never used smokeless tobacco. He reports that he does not drink alcohol or use  drugs.  Allergies:  Allergies  Allergen Reactions  . Nsaids Nausea Only    GI issue  . Ibuprofen Other (See Comments)    GI Issues  . Ace Inhibitors Cough    Medications: I have reviewed the patient's current medications.  Results for orders placed or performed during the hospital encounter of 02/20/16 (from the past 48 hour(s))  CBG monitoring, ED     Status: Abnormal   Collection Time: 02/20/16  2:23 PM  Result Value Ref Range   Glucose-Capillary 223 (H) 65 - 99 mg/dL  Protime-INR     Status: Abnormal   Collection Time: 02/20/16  2:30 PM  Result Value Ref Range   Prothrombin Time 16.1 (H) 11.4 - 15.2 seconds   INR 1.28   APTT     Status: None   Collection Time: 02/20/16  2:30 PM  Result Value Ref Range   aPTT 32 24 - 36 seconds  Comprehensive metabolic panel     Status: Abnormal   Collection Time: 02/20/16  2:30 PM  Result Value Ref Range   Sodium 141 135 - 145 mmol/L   Potassium 4.4 3.5 - 5.1 mmol/L   Chloride 110 101 - 111 mmol/L   CO2 21 (L) 22 - 32 mmol/L   Glucose, Bld 228 (H) 65 - 99 mg/dL   BUN 35 (H) 6 - 20 mg/dL   Creatinine, Ser 2.27 (H) 0.61 - 1.24 mg/dL   Calcium 7.7 (L) 8.9 - 10.3 mg/dL   Total Protein 5.4 (L) 6.5 - 8.1 g/dL   Albumin 2.2 (L) 3.5 - 5.0 g/dL   AST 27 15 - 41 U/L   ALT 6 (L) 17 - 63 U/L   Alkaline Phosphatase 44 38 - 126 U/L   Total Bilirubin 0.2 (L) 0.3 - 1.2 mg/dL   GFR calc non Af Amer 26 (L) >60 mL/min   GFR calc Af Amer 30 (L) >60 mL/min    Comment: (NOTE) The eGFR has been calculated using the CKD EPI equation. This calculation has not been validated in all clinical situations. eGFR's persistently <60 mL/min signify possible Chronic Kidney Disease.    Anion gap 10 5 - 15  I-stat troponin, ED     Status: None   Collection Time: 02/20/16  2:42 PM  Result Value Ref Range   Troponin i, poc 0.06 0.00 - 0.08 ng/mL   Comment  3            Comment: Due to the release kinetics of cTnI, a negative result within the first hours of  the onset of symptoms does not rule out myocardial infarction with certainty. If myocardial infarction is still suspected, repeat the test at appropriate intervals.   CBC with Differential     Status: Abnormal   Collection Time: 02/20/16  4:45 PM  Result Value Ref Range   WBC 7.4 4.0 - 10.5 K/uL   RBC 1.08 (L) 4.22 - 5.81 MIL/uL   Hemoglobin 3.4 (LL) 13.0 - 17.0 g/dL    Comment: REPEATED TO VERIFY SPECIMEN CHECKED FOR CLOTS RESULTS VERIFIED VIA RECOLLECT CRITICAL RESULT CALLED TO, READ BACK BY AND VERIFIED WITH: B FUNK,RN 1751 02/20/2016 WBOND    HCT 12.2 (L) 39.0 - 52.0 %   MCV 113.0 (H) 78.0 - 100.0 fL   MCH 31.5 26.0 - 34.0 pg   MCHC 27.9 (L) 30.0 - 36.0 g/dL   RDW 24.3 (H) 11.5 - 15.5 %   Platelets 113 (L) 150 - 400 K/uL    Comment: REPEATED TO VERIFY SPECIMEN CHECKED FOR CLOTS PLATELETS APPEAR DECREASED PLATELET COUNT CONFIRMED BY SMEAR    Neutrophils Relative % 80 %   Lymphocytes Relative 14 %   Monocytes Relative 5 %   Eosinophils Relative 1 %   Basophils Relative 0 %   Neutro Abs 5.9 1.7 - 7.7 K/uL   Lymphs Abs 1.0 0.7 - 4.0 K/uL   Monocytes Absolute 0.4 0.1 - 1.0 K/uL   Eosinophils Absolute 0.1 0.0 - 0.7 K/uL   Basophils Absolute 0.0 0.0 - 0.1 K/uL   RBC Morphology POLYCHROMASIA PRESENT     Comment: RARE NRBCs  I-Stat Venous Blood Gas, ED (order at [facility] and [facility] only)     Status: Abnormal   Collection Time: 02/20/16  4:56 PM  Result Value Ref Range   pH, Ven 7.429 (H) 7.250 - 7.300   pCO2, Ven 34.8 (L) 45.0 - 50.0 mmHg   pO2, Ven 24.0 (L) 31.0 - 45.0 mmHg   Bicarbonate 23.1 20.0 - 24.0 mEq/L   TCO2 24 0 - 100 mmol/L   O2 Saturation 46.0 %   Acid-base deficit 1.0 0.0 - 2.0 mmol/L   Patient temperature HIDE    Collection site BRACHIAL ARTERY    Sample type VENOUS    Comment NOTIFIED PHYSICIAN   Type and screen     Status: None (Preliminary result)   Collection Time: 02/20/16  5:19 PM  Result Value Ref Range   ABO/RH(D) O POS    Antibody Screen NEG     Sample Expiration 02/23/2016    Unit Number [id number]    Blood Component Type RBC LR PHER1    Unit division 00    Status of Unit ISSUED    Unit tag comment VERBAL ORDERS PER DR ISAACS    Transfusion Status OK TO TRANSFUSE    Crossmatch Result COMPATIBLE    Unit Number [id number]    Blood Component Type RED CELLS,LR    Unit division 00    Status of Unit ALLOCATED    Transfusion Status OK TO TRANSFUSE    Crossmatch Result Compatible    Unit Number [id number]    Blood Component Type RBC LR PHER1    Unit division 00    Status of Unit ALLOCATED    Transfusion Status OK TO TRANSFUSE    Crossmatch Result Compatible    Unit Number [id number]  Blood Component Type RED CELLS,LR    Unit division 00    Status of Unit ALLOCATED    Transfusion Status OK TO TRANSFUSE    Crossmatch Result Compatible    Unit Number [id number]    Blood Component Type RED CELLS,LR    Unit division 00    Status of Unit ALLOCATED    Transfusion Status OK TO TRANSFUSE    Crossmatch Result Compatible   Troponin I     Status: Abnormal   Collection Time: 02/20/16  6:16 PM  Result Value Ref Range   Troponin I 0.04 (HH) <0.03 ng/mL    Comment: CRITICAL RESULT CALLED TO, READ BACK BY AND VERIFIED WITH: B FUNK,RN 1901 02/20/16 D BRADLEY   Prepare RBC     Status: None   Collection Time: 02/20/16  6:30 PM  Result Value Ref Range   Order Confirmation ORDER PROCESSED BY BLOOD BANK   Prepare RBC     Status: None   Collection Time: 02/20/16  6:31 PM  Result Value Ref Range   Order Confirmation ORDER PROCESSED BY BLOOD BANK   Lactic acid, plasma     Status: Abnormal   Collection Time: 02/20/16  6:47 PM  Result Value Ref Range   Lactic Acid, Venous 2.3 (HH) 0.5 - 1.9 mmol/L    Comment: CRITICAL RESULT CALLED TO, READ BACK BY AND VERIFIED WITH: L BERDIK,RN 1924 02/20/16 D BRADLEY     Dg Chest Portable 1 View  Result Date: 02/20/2016 CLINICAL DATA:  Stroke. EXAM: PORTABLE CHEST 1 VIEW COMPARISON:   01/28/2016 FINDINGS: Status post median sternotomy. Left-sided PICC line tip overlies the level of the lower superior vena cava. The heart is enlarged. There is streaky atelectasis in the right perihilar region and left lung base. No focal consolidations or evidence for pulmonary edema. IMPRESSION: Cardiomegaly without pulmonary edema. Bibasilar atelectasis. Electronically Signed   By: Nolon Nations M.D.   On: 02/20/2016 17:43   Ct Head Code Stroke W/o Cm  Result Date: 02/20/2016 CLINICAL DATA:  Code stroke.  Left facial droop.  Blown pupil. EXAM: CT HEAD WITHOUT CONTRAST TECHNIQUE: Contiguous axial images were obtained from the base of the skull through the vertex without intravenous contrast. COMPARISON:  CT head 04/03/2014 FINDINGS: Generalized atrophy. Hypodensity in the periventricular white matter bilaterally similar to the prior study and consistent with chronic microvascular ischemia. Negative for acute infarct. Negative for dense MCA. Atherosclerotic calcification in the vertebral and carotid arteries bilaterally. Negative for hemorrhage or mass. No shift of the midline structures. Negative calvarium. ASPECTS [facility] Stroke Program Early CT Score, [email or web address]) - Ganglionic level infarction (caudate, lentiform nuclei, internal capsule, insula, M1-M3 cortex): 7 - Supraganglionic infarction (M4-M6 cortex): 3 Total score (0-10 with 10 being normal): 10 IMPRESSION: 1. No acute intracranial abnormality. Atrophy and chronic ischemic changes. 2. ASPECTS score 10 These results were called by telephone at the time of interpretation on 02/20/2016 at 2:41 pm to Dr. Tasia Catchings, who verbally acknowledged these results. Electronically Signed   By: Franchot Gallo M.D.   On: 02/20/2016 14:45    Review of Systems  Constitutional: Positive for malaise/fatigue. Negative for chills and fever.  HENT: Negative.   Eyes: Negative.   Respiratory: Negative.   Cardiovascular: Negative.    Gastrointestinal: Negative.   Genitourinary: Positive for hematuria. Negative for dysuria and urgency.  Musculoskeletal: Negative.  Negative for neck pain.  Skin: Negative.   Neurological: Positive for seizures and weakness.  Endo/Heme/Allergies: Negative.   Psychiatric/Behavioral: Negative.  Blood pressure (!) 171/73, pulse 66, temperature 97.9 F (36.6 C), temperature source Oral, resp. rate 19, height '5\' 10"'$  (1.778 m), weight 74.4 kg (164 lb 0.4 oz), SpO2 98 %. Physical Exam  Constitutional: He is oriented to person, place, and time. He appears well-developed.  Elderly,  Son at bedside.   HENT:  Head: Normocephalic.  Eyes: Pupils are equal, round, and reactive to light.  Neck: Normal range of motion.  Cardiovascular: Normal rate.   Respiratory: Effort normal.  Non-labored  GI: Soft.  Genitourinary: Penis normal.  Genitourinary Comments: Foley c/d/i with dark, bloody urine.   Neurological: He is alert and oriented to person, place, and time.  Skin: Skin is warm.  Psychiatric: He has a normal mood and affect.   BEDSIDE IRRIGATION: Using aseptic technique 3L H2O irrigated in 60cc aliquots by  Hand to clear. Approx 44cc old clot removed. Conneted to NS CBI following with efflus light pink. RN instructed on titration  Assessment/Plan:  1 - Gross Hematuria / Anemia - impressive case. Liekly from ongoing radiation cystitis / recurrent prostate cancer. Very frankly discussed with patient overall chronci nature of this options including purely palliative approach (no further transfusions, procedural intervention) v. Aggressive approach with transfusion and even consider formal operative cysto-clot evac / fulgeration with goal of confirming source of bleeding and then controlling. This would hopefully break cycle of in / out hospital for bleeding. At this point pt and family leaning towards more aggressive management. Practically, this would need to involve transfusion to hgb >7,  transfer to hospital service at [facility] 8/12, and then likely OR on Sunday.  Continue continuous bladder irrigation in interval. His acute ongoing blood loss does not appear drastic.  2 - Recurrent Prostate Cancer - most recently under good biochemical control by serum markers.   3 - Urinary Retention - continue catheter for now, not candidate for trial of void this admission.   4 - Stage 4 Renal Insufficiency - likely medical renal disease.   Samyiah Halvorsen 02/20/2016, 11:27 PM

## 2016-02-20 NOTE — Consult Note (Signed)
Initial Neurological Consultation                      NEURO HOSPITALIST CONSULT NOTE   Requestig physician: Dr. Ellender Hose   Reason for Consult:  New onset seizure. Question of left facial droop and ischemic event.   HPI:                                                                                                                                          [name]. is an 80 y.o. male with a known history of multiple medical conditions who presents with a new onset seizure. The specific details are unknown at the present time. However, it seems that he had a witnessed generalized tonic-clonic seizure that was noted at a skilled care nursing facility. After the event he appeared lethargic and there was a question of a possible left facial droop.  Upon arrival in the emergency room [name] appeared somewhat lethargic but had no obvious focal neurological complaints. CT of the brain was performed and revealed no evidence of hemorrhage, or mass lesion, or prior ischemic change. There was evidence of a mild to moderate diffuse atrophy.    Past Medical History:  Diagnosis Date  . Angiomyolipoma 2009   On both kidneys noted in 2009  . Arthritis    neck and left wrist  . Atrial fibrillation ([place])   . CAD (coronary artery disease)    s/b CABG 1994, and subsequent stents. Repeat CABG 12/2011,  . Chronic diastolic heart failure ([place])   . CKD (chronic kidney disease)   . Depressive disorder   . Diabetes mellitus type 2 with peripheral artery disease (HCC)    DIET CONTROLLED  . DVT (deep venous thrombosis) ([place]) 2011   Right arm  . Gastroparesis   . GERD (gastroesophageal reflux disease)   . Gout   . History of hiatal hernia   . Hyperlipidemia   . Hypertension   . Internal hemorrhoids without mention of complication   . Ischemic colitis ([place])   . 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 Myelodysplastic syndrome ([place]) 05/22/2013   With low hemoglobin and platelets treated with Procrit  .  Osteopenia   . Peptic ulcer    S/p partial gastrectomy in 1969  . Peripheral artery disease ([place])   . Pneumonia 01/16/2016  . Presence of permanent cardiac pacemaker   . Prostate cancer ([place]) 1997   XRT and lupron  . Renal artery stenosis ([place])   . Sick sinus syndrome ([place])   . Vitamin B 12 deficiency     Past Surgical History:  Procedure Laterality Date  . ABDOMINAL ANGIOGRAM N/A 05/25/2011   Procedure: ABDOMINAL ANGIOGRAM;  Surgeon: Serafina Mitchell, MD;  Location: [facility] CATH LAB;  Service: Cardiovascular;  Laterality: N/A;  . ABDOMINAL ANGIOGRAM N/A 02/13/2013   Procedure: ABDOMINAL ANGIOGRAM;  Surgeon: Serafina Mitchell,  MD;  Location: [place] CATH LAB;  Service: Cardiovascular;  Laterality: N/A;  . APPENDECTOMY  1991  . CELIAC ARTERY ANGIOPLASTY  05-16-12   and stenting  . CHOLECYSTECTOMY  Oct 2009   Laparoscopic  . CORONARY ARTERY BYPASS GRAFT  01/22/1993  . CORONARY ARTERY BYPASS GRAFT  01/03/2012   Procedure: REDO CORONARY ARTERY BYPASS GRAFTING (CABG);  Surgeon: Gaye Pollack, MD;  Location: [place];  Service: Open Heart Surgery;  Laterality: N/A;  Redo CABG x  using bilateral internal mammary arteries;  left leg greater saphenous vein harvested endoscopically  . FOREIGN BODY REMOVAL ABDOMINAL Right 01/27/2016   Procedure: EXPOSURE OF RIGHT COMMON FEMORAL ARTERY AND REMOVAL FOREIGN;  Surgeon: Evans Lance, MD;  Location: [place];  Service: Cardiovascular;  Laterality: Right;  . HIATAL HERNIA REPAIR     and ulcer repair  . I&D EXTREMITY Left 01/21/2016   Procedure: IRRIGATION AND DEBRIDEMENT EXTREMITY;  Surgeon: Milly Jakob, MD;  Location: [place];  Service: Orthopedics;  Laterality: Left;  . INGUINAL HERNIA REPAIR Right 10/28/2015   Procedure: OPEN RIGHT INGUINAL HERNIA REPAIR;  Surgeon: Greer Pickerel, MD;  Location: [facility];  Service: General;  Laterality: Right;  . INSERTION OF MESH Right 10/28/2015   Procedure: INSERTION OF MESH;  Surgeon: Greer Pickerel, MD;  Location: [facility];  Service:  General;  Laterality: Right;  . IR GENERIC HISTORICAL  02/02/2016   IR FLUORO GUIDE CV LINE LEFT 02/02/2016 Markus Daft, MD [facility]  . IR GENERIC HISTORICAL  02/02/2016   IR US GUIDE VASC ACCESS LEFT 02/02/2016 Markus Daft, MD [facility]  . LEFT HEART CATHETERIZATION WITH CORONARY/GRAFT ANGIOGRAM  12/24/2011   Procedure: LEFT HEART CATHETERIZATION WITH Beatrix Fetters;  Surgeon: Sinclair Grooms, MD;  Location: [facility] CATH LAB;  Service: Cardiovascular;;  . LOWER EXTREMITY ANGIOGRAM Bilateral 05/25/2011   Procedure: LOWER EXTREMITY ANGIOGRAM;  Surgeon: Serafina Mitchell, MD;  Location: [facility] CATH LAB;  Service: Cardiovascular;  Laterality: Bilateral;  . OTHER SURGICAL HISTORY  02/13/13   superior mesenteric artery angiogram  . OTHER SURGICAL HISTORY  05/16/12   Stent in stomach  . PACEMAKER GENERATOR CHANGE  12/10/2003   SJM Identity XL DR performed by Dr Leonia Reeves  . PACEMAKER INSERTION  10/18/1994   DDD pacemaker, [employer]. Gen change 12/10/2003.  Marland Kitchen PACEMAKER LEAD REMOVAL Right 01/27/2016   Procedure: PACEMAKER EXTRACTION;  Surgeon: Evans Lance, MD;  Location: [place];  Service: Cardiovascular;  Laterality: Right;  DR. Roxy Manns TO BACKUP CASE  . PARTIAL GASTRECTOMY  1969   Hx of ulcer s/p partial gastrectomy/ has pernicious anemia  . RENAL ANGIOGRAM N/A 02/13/2013   Procedure: RENAL ANGIOGRAM;  Surgeon: Serafina Mitchell, MD;  Location: [facility] CATH LAB;  Service: Cardiovascular;  Laterality: N/A;  . TEE WITHOUT CARDIOVERSION N/A 01/26/2016   Procedure: TRANSESOPHAGEAL ECHOCARDIOGRAM (TEE);  Surgeon: Sueanne Margarita, MD;  Location: [facility] ENDOSCOPY;  Service: Cardiovascular;  Laterality: N/A;  . TEE WITHOUT CARDIOVERSION N/A 01/27/2016   Procedure: TRANSESOPHAGEAL ECHOCARDIOGRAM (TEE);  Surgeon: Evans Lance, MD;  Location: [place];  Service: Cardiovascular;  Laterality: N/A;  . THROMBECTOMY / EMBOLECTOMY SUBCLAVIAN ARTERY  02/02/10   Right subclavian thromboectomy and venous angioplasty, and chronic mesenteric  ischemia with Herculink stenting to superior mesenteric and celiac arteries - Dr. Trula Slade  . VISCERAL ANGIOGRAM N/A 05/25/2011   Procedure: Laurita Quint;  Surgeon: Serafina Mitchell, MD;  Location: [facility] CATH LAB;  Service: Cardiovascular;  Laterality: N/A;  . VISCERAL ANGIOGRAM Bilateral  12/28/2011   Procedure: VISCERAL ANGIOGRAM;  Surgeon: Serafina Mitchell, MD;  Location: [facility] CATH LAB;  Service: Cardiovascular;  Laterality: Bilateral;  . VISCERAL ANGIOGRAM N/A 05/16/2012   Procedure: VISCERAL ANGIOGRAM;  Surgeon: Serafina Mitchell, MD;  Location: [facility] CATH LAB;  Service: Cardiovascular;  Laterality: N/A;  . VISCERAL ANGIOGRAM N/A 02/13/2013   Procedure: VISCERAL ANGIOGRAM;  Surgeon: Serafina Mitchell, MD;  Location: [facility] CATH LAB;  Service: Cardiovascular;  Laterality: N/A;    MEDICATIONS:                                                                                                                     I have reviewed the patient's current medications.  Allergies  Allergen Reactions  . Nsaids Other (See Comments)    GI issue  . Ibuprofen Other (See Comments)    GI Issues  . Ace Inhibitors Cough     Social History:  reports that he quit smoking about 46 years ago. His smoking use included Cigarettes. He quit after 20.00 years of use. He has never used smokeless tobacco. He reports that he does not drink alcohol or use drugs.  Family History  Problem Relation Age of Onset  . Diabetes Mother   . Heart disease Mother     Heart Disease before age 56  . Hyperlipidemia Mother   . Hypertension Mother   . CVA Mother 69    cause of death  . Cancer Father     stomach/liver  . Hypertension Father     possibly hypertensive  . Cancer Sister     Breast cancer  . CAD Brother   . Heart disease Brother   . Cancer Paternal Uncle     colon  . Heart disease Sister   . Diabetes Sister   . Hypertension Sister   . Heart disease Daughter     Heart Disease before age 71  . Hypertension Daughter   .  Heart attack Daughter      ROS:                                                                                                                                       History obtained from chart review  General ROS: negative for - chills, fatigue, fever, night sweats, weight gain or weight loss Psychological ROS: negative for - behavioral disorder, hallucinations, memory difficulties, mood swings or suicidal ideation Ophthalmic  ROS: negative for - blurry vision, double vision, eye pain or loss of vision ENT ROS: negative for - epistaxis, nasal discharge, oral lesions, sore throat, tinnitus or vertigo Allergy and Immunology ROS: negative for - hives or itchy/watery eyes Hematological and Lymphatic ROS: negative for - bleeding problems, bruising or swollen lymph nodes Endocrine ROS: negative for - galactorrhea, hair pattern changes, polydipsia/polyuria or temperature intolerance Respiratory ROS: negative for - cough, hemoptysis, shortness of breath or wheezing Cardiovascular ROS: negative for - chest pain, dyspnea on exertion, edema or irregular heartbeat Gastrointestinal ROS: negative for - abdominal pain, diarrhea, hematemesis, nausea/vomiting or stool incontinence Genito-Urinary ROS: negative for - dysuria, hematuria, incontinence or urinary frequency/urgency Musculoskeletal ROS: negative for - joint swelling or muscular weakness Neurological ROS: as noted in HPI Dermatological ROS: negative for rash and skin lesion changes   General Exam                                                                                                      Blood pressure 148/58, pulse 87, temperature 97.7 F (36.5 C), temperature source Oral, resp. rate 18, height 5\' 10"  (1.778 m), SpO2 97 %. HEENT-  Normocephalic, no lesions, without obvious abnormality.  Normal external eye and conjunctiva.  Normal TM's bilaterally.  Normal auditory canals and external ears. Normal external nose, mucus membranes and  septum.  Normal pharynx. Cardiovascular- regular rate and rhythm, S1, S2 normal, no murmur, click, rub or gallop, pulses palpable throughout   Lungs- chest clear, no wheezing, rales, normal symmetric air entry, Heart exam - S1, S2 normal, no murmur, no gallop, rate regular Abdomen- soft, non-tender; bowel sounds normal; no masses,  no organomegaly Extremities- less then 2 second capillary refill Lymph-no adenopathy palpable Musculoskeletal: Joint tenderness and swelling was noted diffusely in numerous joints.   Neurological Examination Mental Status: [name] appears lethargic but answers questions appropriately. He is able to move all 4 extremities to command. Cranial Nerves: Pupils are equal round reactive to light. The extraocular movements are intact. There is no facial asymmetry. The tongue is midline. Motor: Motor exam is antigravity in all 4 extremities there is no pronator drift. Sensory: Pinprick and light touch intact throughout, bilaterally Deep Tendon Reflexes: 1-2+ and symmetric throughout Plantars: Right: downgoing   Left: downgoing Cerebellar: Slow but grossly normal on finger to nose testing    Lab Results: Basic Metabolic Panel: No results for input(s): NA, K, CL, CO2, GLUCOSE, BUN, CREATININE, CALCIUM, MG, PHOS in the last 168 hours.  Liver Function Tests: No results for input(s): AST, ALT, ALKPHOS, BILITOT, PROT, ALBUMIN in the last 168 hours. No results for input(s): LIPASE, AMYLASE in the last 168 hours. No results for input(s): AMMONIA in the last 168 hours.  CBC: No results for input(s): WBC, NEUTROABS, HGB, HCT, MCV, PLT in the last 168 hours.  Cardiac Enzymes: No results for input(s): CKTOTAL, CKMB, CKMBINDEX, TROPONINI in the last 168 hours.  Lipid Panel: No results for input(s): CHOL, TRIG, HDL, CHOLHDL, VLDL, LDLCALC in the last 168 hours.  CBG:  Recent Labs Lab 02/20/16 [place]  223*    Microbiology: Results for orders placed or  performed during the hospital encounter of 01/16/16  Blood culture (routine x 2)     Status: Abnormal   Collection Time: 01/16/16  1:00 PM  Result Value Ref Range Status   Specimen Description LEFT ANTECUBITAL  Final   Special Requests BOTTLES DRAWN AEROBIC AND ANAEROBIC 10CC  Final   Culture  Setup Time   Final    GRAM POSITIVE COCCI IN CLUSTERS IN BOTH AEROBIC AND ANAEROBIC BOTTLES CRITICAL RESULT CALLED TO, READ BACK BY AND VERIFIED WITH: J MARKLE 01/17/16 @ 0927 M VESTAL    Culture (A)  Final    STAPHYLOCOCCUS AUREUS SUSCEPTIBILITIES PERFORMED ON PREVIOUS CULTURE WITHIN THE LAST 5 DAYS.    Report Status 01/19/2016 FINAL  Final  Blood culture (routine x 2)     Status: Abnormal   Collection Time: 01/16/16  1:06 PM  Result Value Ref Range Status   Specimen Description BLOOD LEFT HAND  Final   Special Requests AEROBIC BOTTLE ONLY 10CC  Final   Culture  Setup Time   Final    GRAM POSITIVE COCCI IN CLUSTERS AEROBIC BOTTLE ONLY CRITICAL RESULT CALLED TO, READ BACK BY AND VERIFIED WITH: J MARKLE 01/17/16 @ 0927 M VESTAL    Culture STAPHYLOCOCCUS AUREUS (A)  Final   Report Status 01/19/2016 FINAL  Final   Organism ID, Bacteria STAPHYLOCOCCUS AUREUS  Final      Susceptibility   Staphylococcus aureus - MIC*    CIPROFLOXACIN >=8 RESISTANT Resistant     ERYTHROMYCIN <=0.25 SENSITIVE Sensitive     GENTAMICIN <=0.5 SENSITIVE Sensitive     OXACILLIN 0.5 SENSITIVE Sensitive     TETRACYCLINE <=1 SENSITIVE Sensitive     VANCOMYCIN <=0.5 SENSITIVE Sensitive     TRIMETH/SULFA <=10 SENSITIVE Sensitive     CLINDAMYCIN <=0.25 SENSITIVE Sensitive     RIFAMPIN <=0.5 SENSITIVE Sensitive     Inducible Clindamycin NEGATIVE Sensitive     * STAPHYLOCOCCUS AUREUS  Blood Culture ID Panel (Reflexed)     Status: Abnormal   Collection Time: 01/16/16  1:06 PM  Result Value Ref Range Status   Enterococcus species NOT DETECTED NOT DETECTED Final   Vancomycin resistance NOT DETECTED NOT DETECTED Final    Listeria monocytogenes NOT DETECTED NOT DETECTED Final   Staphylococcus species DETECTED (A) NOT DETECTED Final    Comment: CRITICAL RESULT CALLED TO, READ BACK BY AND VERIFIED WITH: J MARKLE 01/17/16 @ 0927 M VESTAL    Staphylococcus aureus DETECTED (A) NOT DETECTED Final    Comment: CRITICAL RESULT CALLED TO, READ BACK BY AND VERIFIED WITH: J MARKLE 01/17/16 @ 0927 M VESTAL    Methicillin resistance NOT DETECTED NOT DETECTED Final   Streptococcus species NOT DETECTED NOT DETECTED Final   Streptococcus agalactiae NOT DETECTED NOT DETECTED Final   Streptococcus pneumoniae NOT DETECTED NOT DETECTED Final   Streptococcus pyogenes NOT DETECTED NOT DETECTED Final   Acinetobacter baumannii NOT DETECTED NOT DETECTED Final   Enterobacteriaceae species NOT DETECTED NOT DETECTED Final   Enterobacter cloacae complex NOT DETECTED NOT DETECTED Final   Escherichia coli NOT DETECTED NOT DETECTED Final   Klebsiella oxytoca NOT DETECTED NOT DETECTED Final   Klebsiella pneumoniae NOT DETECTED NOT DETECTED Final   Proteus species NOT DETECTED NOT DETECTED Final   Serratia marcescens NOT DETECTED NOT DETECTED Final   Carbapenem resistance NOT DETECTED NOT DETECTED Final   Haemophilus influenzae NOT DETECTED NOT DETECTED Final   Neisseria meningitidis NOT DETECTED NOT  DETECTED Final   Pseudomonas aeruginosa NOT DETECTED NOT DETECTED Final   Candida albicans NOT DETECTED NOT DETECTED Final   Candida glabrata NOT DETECTED NOT DETECTED Final   Candida krusei NOT DETECTED NOT DETECTED Final   Candida parapsilosis NOT DETECTED NOT DETECTED Final   Candida tropicalis NOT DETECTED NOT DETECTED Final  Culture, sputum-assessment     Status: None   Collection Time: 01/16/16 10:24 PM  Result Value Ref Range Status   Specimen Description EXPECTORATED SPUTUM  Final   Special Requests NONE  Final   Sputum evaluation   Final    THIS SPECIMEN IS ACCEPTABLE. RESPIRATORY CULTURE REPORT TO FOLLOW.   Report Status  01/17/2016 FINAL  Final  Culture, respiratory (NON-Expectorated)     Status: None   Collection Time: 01/16/16 10:24 PM  Result Value Ref Range Status   Specimen Description EXPECTORATED SPUTUM  Final   Special Requests NONE  Final   Gram Stain   Final    RARE SQUAMOUS EPITHELIAL CELLS PRESENT ABUNDANT WBC PRESENT,BOTH PMN AND MONONUCLEAR FEW GRAM POSITIVE COCCI IN PAIRS IN CLUSTERS    Culture Consistent with normal respiratory flora.  Final   Report Status 01/19/2016 FINAL  Final  Culture, blood (routine x 2)     Status: None   Collection Time: 01/17/16  9:50 AM  Result Value Ref Range Status   Specimen Description BLOOD BLOOD LEFT ARM  Final   Special Requests BOTTLES DRAWN AEROBIC ONLY 5CC  Final   Culture NO GROWTH 5 DAYS  Final   Report Status 01/22/2016 FINAL  Final  Culture, blood (routine x 2)     Status: None   Collection Time: 01/17/16  9:50 AM  Result Value Ref Range Status   Specimen Description BLOOD LEFT ANTECUBITAL  Final   Special Requests BOTTLES DRAWN AEROBIC ONLY 5CC  Final   Culture NO GROWTH 5 DAYS  Final   Report Status 01/22/2016 FINAL  Final  Acid Fast Smear (AFB)     Status: None   Collection Time: 01/17/16 11:26 AM  Result Value Ref Range Status   AFB Specimen Processing Concentration  Final   Acid Fast Smear Negative  Final    Comment: (NOTE) Performed At: [facility] [place], [place] [id number] Lindon Romp MD [id number]    Source (AFB) SPUTUM  Final  Body fluid culture     Status: None   Collection Time: 01/19/16  4:37 PM  Result Value Ref Range Status   Specimen Description FLUID SYNOVIAL WRIST LEFT  Final   Special Requests NONE  Final   Gram Stain   Final    ABUNDANT WBC PRESENT,BOTH PMN AND MONONUCLEAR NO ORGANISMS SEEN    Culture NO GROWTH 3 DAYS  Final   Report Status 01/22/2016 FINAL  Final  Aerobic/Anaerobic Culture (surgical/deep wound)     Status: None   Collection Time: 01/21/16  6:52 PM  Result  Value Ref Range Status   Specimen Description WOUND LEFT WRIST  Final   Special Requests NONE  Final   Gram Stain   Final    WBC PRESENT,BOTH PMN AND MONONUCLEAR NO ORGANISMS SEEN    Culture No growth aerobically or anaerobically.  Final   Report Status 01/26/2016 FINAL  Final  Surgical pcr screen     Status: Abnormal   Collection Time: 01/27/16  1:12 PM  Result Value Ref Range Status   MRSA, PCR NEGATIVE NEGATIVE Final   Staphylococcus aureus POSITIVE (A) NEGATIVE Final  Comment:        The Xpert SA Assay (FDA approved for NASAL specimens in patients over 15 years of age), is one component of a comprehensive surveillance program.  Test performance has been validated by [facility] for patients greater than or equal to 47 year old. It is not intended to diagnose infection nor to guide or monitor treatment.     Coagulation Studies: No results for input(s): LABPROT, INR in the last 72 hours.  Imaging: Ct Head Code Stroke W/o Cm  Result Date: 02/20/2016 CLINICAL DATA:  Code stroke.  Left facial droop.  Blown pupil. EXAM: CT HEAD WITHOUT CONTRAST TECHNIQUE: Contiguous axial images were obtained from the base of the skull through the vertex without intravenous contrast. COMPARISON:  CT head 04/03/2014 FINDINGS: Generalized atrophy. Hypodensity in the periventricular white matter bilaterally similar to the prior study and consistent with chronic microvascular ischemia. Negative for acute infarct. Negative for dense MCA. Atherosclerotic calcification in the vertebral and carotid arteries bilaterally. Negative for hemorrhage or mass. No shift of the midline structures. Negative calvarium. ASPECTS [facility] Stroke Program Early CT Score, http://www.aspectsinstroke.com) - Ganglionic level infarction (caudate, lentiform nuclei, internal capsule, insula, M1-M3 cortex): 7 - Supraganglionic infarction (M4-M6 cortex): 3 Total score (0-10 with 10 being normal): 10 IMPRESSION: 1. No acute  intracranial abnormality. Atrophy and chronic ischemic changes. 2. ASPECTS score 10 These results were called by telephone at the time of interpretation on 02/20/2016 at 2:41 pm to Dr. Tasia Catchings, who verbally acknowledged these results. Electronically Signed   By: Franchot Gallo M.D.   On: 02/20/2016 14:45    Assessment/Plan:  [name] is a 80 year old gentleman with numerous medical conditions. He presents today after a generalized tonic-clonic seizure. His exam is nonfocal with a normal cranial nerve exam. His general appearance is that of a post-ictal patient. His CT of the brain is normal. His notes indicate that he has a pacemaker, and as such MRI and MRA cannot be performed. It is felt unlikely that he has experienced an ischemic event.  Plan:  1. EEG  2. Seizure precautions.  3. Speech evaluation prior to initiating by mouth.  4. Telemetry monitoring.  5. Frequent neurological checks.  6. Given that this is his first seizure we will hold on starting any antiepileptic medications at this point.  7. We will follow his progress with you.    James A. Tasia Catchings, M.D. Neurohospitalist Phone: [phone]  02/20/2016, 2:55 PM

## 2016-02-20 NOTE — Code Documentation (Signed)
80yo male arriving to [facility] via [place] at 9.  Patient from SNF where he was involved in therapy when he was witnessed to have a seizure followed by left facial droop at 1315.  EMS activated a code stroke.  Stroke team at the bedside on patient arrival.  Labs drawn and patient cleared for CT by Dr. Ellender Hose.  Patient to CT with team.  CT completed.  NIHSS 1, see documentation for details and code stroke times.  Patient reports decreased sensation in the LLE.  Patient with foley catheter in place with bloody output.  Dr. Tasia Catchings at the bedside.  No acute stroke treatment at this time.  Bedside handoff with ED RN Santiago Glad.

## 2016-02-20 NOTE — ED Notes (Signed)
cbg taken, 229, RN notified

## 2016-02-20 NOTE — ED Triage Notes (Signed)
Code Stroke - Witnessed LKN today at 1315 with physical therapy.  Left sided facial droop and seizure 1-2 minutes in duration.  Patient has a foley present on arrival, the product is bloody in consistency.

## 2016-02-20 NOTE — H&P (Signed)
History and Physical    [name] ZES:[id number] DOB: 01/06/1936 DOA: 02/20/2016   PCP: [name], MD   Patient coming from:  Home  Chief Complaint:  HPI: [name]. is a 80 y.o. male with medical history significant for CAD, CKD stage 4 , DM, h/o DVT, GERD, Afib Arthritis, depression, MDS with chronic anemia and thrombocytopenia  on Procrit. presenting to the ED with a new-onset of seizure. She this was witnessed in his skilled nursing facility, likely tonic clonic in nature, and at the time he was lethargic, with possible left facial droop. He reported a frontal headache as prodromal symptom.  This may have lasted about 2-3 minutes. Once at the ER, a CT of the brain was performed, revealing no evidence of hemorrhage, or mass lesion, or prior ischemic change. Neurology consultation was obtained,recommended that the patient be admitted in telemetry, an MRI will be performed to rule out any intracranial abnormalities, hold anticoagulation and continue on aspirin He denies any changes in medications. He denies any recent trips or insect bites. He does not smoke or drink. Of note, the patient had pacemaker extraction on 01/27/2016 by Dr. Lovena Le, which he tolerated well. No other surgical procedures. He denies any chest pain or palpitations. He denies any shortness of breath or cough. He denies any abdominal pain, or diarrhea. He has chronic catheter laser for urination, and has visible blood in his urine, which she reports this is chronic.his last cystoscopy with bladder clot evacuation and bladder irrigation was in 01/21/2016.  No clots are visible in the urine.   ED Course:  BP 147/68   Pulse 69   Temp 97.7 F (36.5 C) (Oral)   Resp 16   Ht 5' 10" (1.778 m)   SpO2 97%    creatinine 2.27 EGFR 31 10 alkaline phosphatase 44 albumin 2.2 AST 27 ALT 6 total bilirubin 0.2 troponin 0.06 PT 16.1 INR 1.28 Glucose 228 Borderline anterior ST elevation CT of the head no  acute intracranial abnormality  Review of Systems: As per HPI otherwise 10 point review of systems negative.   Past Medical History:  Diagnosis Date  . Angiomyolipoma 2009   On both kidneys noted in 2009  . Arthritis    neck and left wrist  . Atrial fibrillation ([place])   . CAD (coronary artery disease)    s/b CABG 1994, and subsequent stents. Repeat CABG 12/2011,  . Chronic diastolic heart failure ([place])   . CKD (chronic kidney disease)   . Depressive disorder   . Diabetes mellitus type 2 with peripheral artery disease (HCC)    DIET CONTROLLED  . DVT (deep venous thrombosis) ([place]) 2011   Right arm  . Gastroparesis   . GERD (gastroesophageal reflux disease)   . Gout   . History of hiatal hernia   . Hyperlipidemia   . Hypertension   . Internal hemorrhoids without mention of complication   . Ischemic colitis ([place])   . 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 Myelodysplastic syndrome ([place]) 05/22/2013   With low hemoglobin and platelets treated with Procrit  . Osteopenia   . Peptic ulcer    S/p partial gastrectomy in 1969  . Peripheral artery disease ([place])   . Pneumonia 01/16/2016  . Presence of permanent cardiac pacemaker   . Prostate cancer ([place]) 1997   XRT and lupron  . Renal artery stenosis ([place])   . Sick sinus syndrome ([place])   . Vitamin B 12 deficiency  Past Surgical History:  Procedure Laterality Date  . ABDOMINAL ANGIOGRAM N/A 05/25/2011   Procedure: ABDOMINAL ANGIOGRAM;  Surgeon: Serafina Mitchell, MD;  Location: [facility] CATH LAB;  Service: Cardiovascular;  Laterality: N/A;  . ABDOMINAL ANGIOGRAM N/A 02/13/2013   Procedure: ABDOMINAL ANGIOGRAM;  Surgeon: Serafina Mitchell, MD;  Location: [facility] CATH LAB;  Service: Cardiovascular;  Laterality: N/A;  . APPENDECTOMY  1991  . CELIAC ARTERY ANGIOPLASTY  05-16-12   and stenting  . CHOLECYSTECTOMY  Oct 2009   Laparoscopic  . CORONARY ARTERY BYPASS GRAFT  01/22/1993  . CORONARY ARTERY BYPASS GRAFT  01/03/2012   Procedure: REDO CORONARY ARTERY  BYPASS GRAFTING (CABG);  Surgeon: Gaye Pollack, MD;  Location: [place];  Service: Open Heart Surgery;  Laterality: N/A;  Redo CABG x  using bilateral internal mammary arteries;  left leg greater saphenous vein harvested endoscopically  . FOREIGN BODY REMOVAL ABDOMINAL Right 01/27/2016   Procedure: EXPOSURE OF RIGHT COMMON FEMORAL ARTERY AND REMOVAL FOREIGN;  Surgeon: Evans Lance, MD;  Location: [place];  Service: Cardiovascular;  Laterality: Right;  . HIATAL HERNIA REPAIR     and ulcer repair  . I&D EXTREMITY Left 01/21/2016   Procedure: IRRIGATION AND DEBRIDEMENT EXTREMITY;  Surgeon: Milly Jakob, MD;  Location: [place];  Service: Orthopedics;  Laterality: Left;  . INGUINAL HERNIA REPAIR Right 10/28/2015   Procedure: OPEN RIGHT INGUINAL HERNIA REPAIR;  Surgeon: Greer Pickerel, MD;  Location: [facility];  Service: General;  Laterality: Right;  . INSERTION OF MESH Right 10/28/2015   Procedure: INSERTION OF MESH;  Surgeon: Greer Pickerel, MD;  Location: [facility];  Service: General;  Laterality: Right;  . IR GENERIC HISTORICAL  02/02/2016   IR FLUORO GUIDE CV LINE LEFT 02/02/2016 Markus Daft, MD [facility]  . IR GENERIC HISTORICAL  02/02/2016   IR US GUIDE VASC ACCESS LEFT 02/02/2016 Markus Daft, MD [facility]  . LEFT HEART CATHETERIZATION WITH CORONARY/GRAFT ANGIOGRAM  12/24/2011   Procedure: LEFT HEART CATHETERIZATION WITH Beatrix Fetters;  Surgeon: Sinclair Grooms, MD;  Location: [facility] CATH LAB;  Service: Cardiovascular;;  . LOWER EXTREMITY ANGIOGRAM Bilateral 05/25/2011   Procedure: LOWER EXTREMITY ANGIOGRAM;  Surgeon: Serafina Mitchell, MD;  Location: [facility] CATH LAB;  Service: Cardiovascular;  Laterality: Bilateral;  . OTHER SURGICAL HISTORY  02/13/13   superior mesenteric artery angiogram  . OTHER SURGICAL HISTORY  05/16/12   Stent in stomach  . PACEMAKER GENERATOR CHANGE  12/10/2003   SJM Identity XL DR performed by Dr Leonia Reeves  . PACEMAKER INSERTION  10/18/1994   DDD pacemaker, [employer]. Gen change  12/10/2003.  Marland Kitchen PACEMAKER LEAD REMOVAL Right 01/27/2016   Procedure: PACEMAKER EXTRACTION;  Surgeon: Evans Lance, MD;  Location: [place];  Service: Cardiovascular;  Laterality: Right;  DR. Roxy Manns TO BACKUP CASE  . PARTIAL GASTRECTOMY  1969   Hx of ulcer s/p partial gastrectomy/ has pernicious anemia  . RENAL ANGIOGRAM N/A 02/13/2013   Procedure: RENAL ANGIOGRAM;  Surgeon: Serafina Mitchell, MD;  Location: [facility] CATH LAB;  Service: Cardiovascular;  Laterality: N/A;  . TEE WITHOUT CARDIOVERSION N/A 01/26/2016   Procedure: TRANSESOPHAGEAL ECHOCARDIOGRAM (TEE);  Surgeon: Sueanne Margarita, MD;  Location: [facility] ENDOSCOPY;  Service: Cardiovascular;  Laterality: N/A;  . TEE WITHOUT CARDIOVERSION N/A 01/27/2016   Procedure: TRANSESOPHAGEAL ECHOCARDIOGRAM (TEE);  Surgeon: Evans Lance, MD;  Location: [place];  Service: Cardiovascular;  Laterality: N/A;  . THROMBECTOMY / EMBOLECTOMY SUBCLAVIAN ARTERY  02/02/10   Right subclavian thromboectomy and  venous angioplasty, and chronic mesenteric ischemia with Herculink stenting to superior mesenteric and celiac arteries - Dr. Trula Slade  . VISCERAL ANGIOGRAM N/A 05/25/2011   Procedure: Laurita Quint;  Surgeon: Serafina Mitchell, MD;  Location: [facility] CATH LAB;  Service: Cardiovascular;  Laterality: N/A;  . VISCERAL ANGIOGRAM Bilateral 12/28/2011   Procedure: VISCERAL ANGIOGRAM;  Surgeon: Serafina Mitchell, MD;  Location: [facility] CATH LAB;  Service: Cardiovascular;  Laterality: Bilateral;  . VISCERAL ANGIOGRAM N/A 05/16/2012   Procedure: VISCERAL ANGIOGRAM;  Surgeon: Serafina Mitchell, MD;  Location: [facility] CATH LAB;  Service: Cardiovascular;  Laterality: N/A;  . VISCERAL ANGIOGRAM N/A 02/13/2013   Procedure: VISCERAL ANGIOGRAM;  Surgeon: Serafina Mitchell, MD;  Location: [facility] CATH LAB;  Service: Cardiovascular;  Laterality: N/A;    Social History Social History   Social History  . Marital status: Married    Spouse name: N/A  . Number of children: N/A  . Years of education: N/A   Occupational  History  . Not on file.   Social History Main Topics  . Smoking status: Former Smoker    Years: 20.00    Types: Cigarettes    Quit date: 07/12/1969  . Smokeless tobacco: Never Used  . Alcohol use No  . Drug use: No  . Sexual activity: Yes   Other Topics Concern  . Not on file   Social History Narrative  . No narrative on file     Allergies  Allergen Reactions  . Nsaids Nausea Only    GI issue  . Ibuprofen Other (See Comments)    GI Issues  . Ace Inhibitors Cough    Family History  Problem Relation Age of Onset  . Diabetes Mother   . Heart disease Mother     Heart Disease before age 29  . Hyperlipidemia Mother   . Hypertension Mother   . CVA Mother 65    cause of death  . Cancer Father     stomach/liver  . Hypertension Father     possibly hypertensive  . Cancer Sister     Breast cancer  . CAD Brother   . Heart disease Brother   . Cancer Paternal Uncle     colon  . Heart disease Sister   . Diabetes Sister   . Hypertension Sister   . Heart disease Daughter     Heart Disease before age 58  . Hypertension Daughter   . Heart attack Daughter       Prior to Admission medications   Medication Sig Start Date End Date Taking? Authorizing Provider  acetaminophen (ARTHRITIS PAIN RELIEF) 650 MG CR tablet Take 1,300 mg by mouth 3 (three) times daily.     Historical Provider, MD  amiodarone (PACERONE) 200 MG tablet Take one-half tablet by  mouth daily 01/07/16   Belva Crome, MD  atorvastatin (LIPITOR) 80 MG tablet Take 80 mg by mouth at bedtime.     Historical Provider, MD  Calcifediol ER (RAYALDEE) 30 MCG CPCR Take 30 mcg by mouth at bedtime.    Historical Provider, MD  carvedilol (COREG) 25 MG tablet TAKE 1 TABLET BY MOUTH  TWICE DAILY WITH MEALS 01/07/16   Belva Crome, MD  ceFAZolin (ANCEF) 1 GM/50ML Inject 50 mLs (1 g total) into the vein every 12 (twelve) hours. To continue through 02/12/2016 02/03/16   Silver Huguenin Elgergawy, MD  cloNIDine (CATAPRES) 0.1 MG tablet  Take 0.1 mg by mouth 2 (two) times daily. 02/19/15   Historical Provider, MD  cyanocobalamin (,VITAMIN B-12,) 1000 MCG/ML injection Inject 1,000 mcg into the muscle every 30 (thirty) days. Vitamin B12 - last injection 09/01/15    Historical Provider, MD  epoetin alfa (EPOGEN,PROCRIT) 83291 UNIT/ML injection Inject 10,000 Units into the skin every 14 (fourteen) days. Done at [facility] cancer center - last injection 09/22/15    Historical Provider, MD  furosemide (LASIX) 80 MG tablet Take 1 tablet (80 mg total) by mouth daily. Pt takes 1 and 1/2 tablet daily. (140 mg) total 02/03/16   Albertine Patricia, MD  hydrALAZINE (APRESOLINE) 50 MG tablet Take 1 tablet (50 mg total) by mouth 3 (three) times daily. 11/23/13   Belva Crome, MD  insulin aspart (NOVOLOG) 100 UNIT/ML injection Inject 0-9 Units into the skin 3 (three) times daily with meals. 02/03/16   Silver Huguenin Elgergawy, MD  insulin aspart (NOVOLOG) 100 UNIT/ML injection Inject 0-5 Units into the skin at bedtime. 02/03/16   Albertine Patricia, MD  isosorbide mononitrate (IMDUR) 120 MG 24 hr tablet Take 1 tablet by mouth  daily 01/07/16   Belva Crome, MD  leuprolide (LUPRON) 30 MG injection Inject 30 mg into the muscle every 4 (four) months.    Historical Provider, MD  Multiple Vitamin (MULTIVITAMIN WITH MINERALS) TABS tablet Take 1 tablet by mouth every morning.    Historical Provider, MD  oxyCODONE-acetaminophen (PERCOCET/ROXICET) 5-325 MG tablet Take 1 tablet by mouth every 6 (six) hours as needed for severe pain. 02/03/16   Silver Huguenin Elgergawy, MD  pantoprazole (PROTONIX) 40 MG tablet Take 1 tablet by mouth daily. 01/14/16   Historical Provider, MD  potassium chloride SA (K-DUR,KLOR-CON) 20 MEQ tablet Take 1.5 tablets (30 mEq total) by mouth daily. 02/03/16   Silver Huguenin Elgergawy, MD  predniSONE (DELTASONE) 10 MG tablet Please use 40 mg oral daily for 3 days, then 30 mg oral daily for 3 days, then 20 mg oral daily for 3 days, then 10 mg oral daily for 3 days 02/03/16    Albertine Patricia, MD  rifampin (RIFADIN) 300 MG capsule Take 600 mg by mouth 2 (two) times daily.    Historical Provider, MD  senna-docusate (SENOKOT-S) 8.6-50 MG tablet Take 2 tablets by mouth 2 (two) times daily. 02/03/16   Silver Huguenin Elgergawy, MD  silodosin (RAPAFLO) 8 MG CAPS capsule Take 8 mg by mouth at bedtime.     Historical Provider, MD  sodium bicarbonate 650 MG tablet Take 1 tablet (650 mg total) by mouth 2 (two) times daily. 02/03/16   Albertine Patricia, MD  UNABLE TO FIND Med Name: Med pass 120 mL 3 times daily    Historical Provider, MD    Physical Exam:    Vitals:   02/20/16 1615 02/20/16 1630 02/20/16 1645 02/20/16 1700  BP: 150/64 136/78 129/59 147/68  Pulse: 85 66 67 69  Resp: _0 Temp:      TempSrc:      SpO2: 99% 100% 100% 97%  Height:           Constitutional: NAD ill appearing Vitals:   02/20/16 1615 02/20/16 1630 02/20/16 1645 02/20/16 1700  BP: 150/64 136/78 129/59 147/68  Pulse: 85 66 67 69  Resp: _1 Temp:      TempSrc:      SpO2: 99% 100% 100% 97%  Height:       Eyes: PERRL, lids and conjunctivae normal ENMT: Mucous membranes are moist. Posterior pharynx clear of any exudate or lesions.Normal  dentition.  Neck: normal, supple, no masses, no thyromegaly Respiratory: clear to auscultation bilaterally, no wheezing, no crackles. Normal respiratory effort. No accessory muscle use.  Cardiovascular: Regular rate and rhythm, 1/6  murmurs / rubs / gallops. Trace bilateral lower  extremity edema. 2+ pedal pulses. No carotid bruits.  Abdomen: no tenderness, no masses palpated. No hepatosplenomegaly. Bowel sounds positive. Urine cath with bloody urine without clots  Musculoskeletal: no clubbing / cyanosis. No joint deformity upper and lower extremities. Good ROM, no contractures. Normal muscle tone.  Skin: no rashes, lesions, ulcers.  Neurologic: CN 2-12 grossly intact. Sensation intact, DTR normal. Strength 5/5 in all 4.  Psychiatric:  Normal judgment and insight. Alert and oriented x 3. Normal mood.     Labs on Admission: I have personally reviewed following labs and imaging studies  CBC: No results for input(s): WBC, NEUTROABS, HGB, HCT, MCV, PLT in the last 168 hours.  Basic Metabolic Panel:  Recent Labs Lab 02/20/16 1430  NA 141  K 4.4  CL 110  CO2 21*  GLUCOSE 228*  BUN 35*  CREATININE 2.27*  CALCIUM 7.7*    GFR: CrCl cannot be calculated (Unknown ideal weight.).  Liver Function Tests:  Recent Labs Lab 02/20/16 1430  AST 27  ALT 6*  ALKPHOS 44  BILITOT 0.2*  PROT 5.4*  ALBUMIN 2.2*   No results for input(s): LIPASE, AMYLASE in the last 168 hours. No results for input(s): AMMONIA in the last 168 hours.  Coagulation Profile:  Recent Labs Lab 02/20/16 1430  INR 1.28    Cardiac Enzymes: No results for input(s): CKTOTAL, CKMB, CKMBINDEX, TROPONINI in the last 168 hours.  BNP (last 3 results) No results for input(s): PROBNP in the last 8760 hours.  HbA1C: No results for input(s): HGBA1C in the last 72 hours.  CBG:  Recent Labs Lab 02/20/16 1423  GLUCAP 223*    Lipid Profile: No results for input(s): CHOL, HDL, LDLCALC, TRIG, CHOLHDL, LDLDIRECT in the last 72 hours.  Thyroid Function Tests: No results for input(s): TSH, T4TOTAL, FREET4, T3FREE, THYROIDAB in the last 72 hours.  Anemia Panel: No results for input(s): VITAMINB12, FOLATE, FERRITIN, TIBC, IRON, RETICCTPCT in the last 72 hours.  Urine analysis:    Component Value Date/Time   COLORURINE RED (A) 01/26/2016 1209   APPEARANCEUR TURBID (A) 01/26/2016 1209   LABSPEC 1.023 01/26/2016 1209   LABSPEC 1.010 05/19/2015 1231   PHURINE 5.0 01/26/2016 1209   GLUCOSEU NEGATIVE 01/26/2016 1209   GLUCOSEU Negative 05/19/2015 1231   HGBUR LARGE (A) 01/26/2016 1209   BILIRUBINUR LARGE (A) 01/26/2016 1209   BILIRUBINUR Negative 05/19/2015 1231   KETONESUR 15 (A) 01/26/2016 1209   PROTEINUR >300 (A) 01/26/2016 1209    UROBILINOGEN 0.2 05/19/2015 1231   NITRITE POSITIVE (A) 01/26/2016 1209   LEUKOCYTESUR MODERATE (A) 01/26/2016 1209   LEUKOCYTESUR Negative 05/19/2015 1231    Sepsis Labs: _0 (procalcitonin:4,lacticidven:4) )No results found for this or any previous visit (from the past 240 hour(s)).   Radiological Exams on Admission: Ct Head Code Stroke W/o Cm  Result Date: 02/20/2016 CLINICAL DATA:  Code stroke.  Left facial droop.  Blown pupil. EXAM: CT HEAD WITHOUT CONTRAST TECHNIQUE: Contiguous axial images were obtained from the base of the skull through the vertex without intravenous contrast. COMPARISON:  CT head 04/03/2014 FINDINGS: Generalized atrophy. Hypodensity in the periventricular white matter bilaterally similar to the prior study and consistent with chronic microvascular ischemia. Negative for acute infarct. Negative for dense MCA. Atherosclerotic calcification in the  vertebral and carotid arteries bilaterally. Negative for hemorrhage or mass. No shift of the midline structures. Negative calvarium. ASPECTS [facility] Stroke Program Early CT Score, http://www.aspectsinstroke.com) - Ganglionic level infarction (caudate, lentiform nuclei, internal capsule, insula, M1-M3 cortex): 7 - Supraganglionic infarction (M4-M6 cortex): 3 Total score (0-10 with 10 being normal): 10 IMPRESSION: 1. No acute intracranial abnormality. Atrophy and chronic ischemic changes. 2. ASPECTS score 10 These results were called by telephone at the time of interpretation on 02/20/2016 at 2:41 pm to Dr. Tasia Catchings, who verbally acknowledged these results. Electronically Signed   By: Franchot Gallo M.D.   On: 02/20/2016 14:45    EKG: Independently reviewed.  Assessment/Plan Active Problems:   PAF (paroxysmal atrial fibrillation) (HCC)   Chronic diastolic heart failure (HCC)   Diabetes mellitus (HCC)   Essential hypertension   CKD (chronic kidney disease), stage IV (HCC)   Anemia of chronic disease   MDS (myelodysplastic  syndrome) (HCC)   Thrombocytopenia (HCC)   Leukopenia   Hematuria   Seizures (HCC)   Seizures,  Unknown etiology   UA  Urine culture sent. CT head negative. Neuro following. Recent UTI/ MSS bacteremia 7/17, Recent TEE and PM extraction  in July 2017    - Tele obs -  Seizure order set   - CK - f/u  Urine and blood culture  Hold anticoagulants due to bleeding in urine ASA ok  Appreciate Neuro f/u  Antiepileptic plans on hold as this was a first ime event CXR to confirm extraction of PM, if so, will corder MRI brain   Hypertension BP 147/68   Pulse 69   Temp 97.7 F (36.5 C) (Oral)   Resp 16   Ht 5' 10" (1.778 m)   SpO2 97%  Controlled Continue home anti-hypertensive medications   PAF s/p PMP for sick sinus syndrome, s/p PM extaction in 01/2016. ChadsVasc 4-5  COntinue meds.  Recommended for anticoagulation due to the history of GI bleed, now with hematuria as well.   Chronic kidney disease stage EGFR 30 , baseline creatinine 3.8    Current Cr 2.7  Lab Results  Component Value Date   CREATININE 2.27 (H) 02/20/2016   CREATININE 3.61 (H) 02/03/2016   CREATININE 3.73 (H) 02/02/2016  IVF Repeat CMET in am  History of Liddle's  Syndrome  Monitor for hypokalemia and hypertension   Myelodysplastic syndrome-anemia of chronic disease-history of thrombocytopenia He receives Epogen injections every 2 weeks at the cancer center. 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  Type II Diabetes Current blood sugar level is 228 Lab Results  Component Value Date   HGBA1C 5.9 (H) 09/26/2015   SSI Heart healthy carb modified die when passing swallow eval at bedside   Bladder mass, with hematuria Consult to urology   Depression Comtinue meds   Continue name performed protein calorie malnutrition  DVT prophylaxis: SCD's  Code Status:   DNR Family Communication:  Discussed with family Disposition Plan: Expect  patient to be discharged to nursing home after condition improves Consults called:    None Admission status:Tele  Obs   WERTMAN,SARA E, PA-C [facility]   If 7PM-7AM, please contact night-coverage [email or web address] Password [facility]  02/20/2016, 5:28 PM

## 2016-02-20 NOTE — ED Notes (Signed)
Dr. Myrene Buddy notified of abnormal VBG

## 2016-02-21 DIAGNOSIS — I248 Other forms of acute ischemic heart disease: Secondary | ICD-10-CM | POA: Diagnosis present

## 2016-02-21 DIAGNOSIS — G40409 Other generalized epilepsy and epileptic syndromes, not intractable, without status epilepticus: Secondary | ICD-10-CM | POA: Diagnosis present

## 2016-02-21 DIAGNOSIS — I130 Hypertensive heart and chronic kidney disease with heart failure and stage 1 through stage 4 chronic kidney disease, or unspecified chronic kidney disease: Secondary | ICD-10-CM | POA: Diagnosis present

## 2016-02-21 DIAGNOSIS — D61818 Other pancytopenia: Secondary | ICD-10-CM | POA: Diagnosis present

## 2016-02-21 DIAGNOSIS — Z951 Presence of aortocoronary bypass graft: Secondary | ICD-10-CM | POA: Diagnosis not present

## 2016-02-21 DIAGNOSIS — D62 Acute posthemorrhagic anemia: Secondary | ICD-10-CM | POA: Diagnosis present

## 2016-02-21 DIAGNOSIS — F329 Major depressive disorder, single episode, unspecified: Secondary | ICD-10-CM | POA: Diagnosis present

## 2016-02-21 DIAGNOSIS — I10 Essential (primary) hypertension: Secondary | ICD-10-CM | POA: Diagnosis not present

## 2016-02-21 DIAGNOSIS — R29810 Facial weakness: Secondary | ICD-10-CM | POA: Diagnosis present

## 2016-02-21 DIAGNOSIS — E1151 Type 2 diabetes mellitus with diabetic peripheral angiopathy without gangrene: Secondary | ICD-10-CM | POA: Diagnosis present

## 2016-02-21 DIAGNOSIS — D469 Myelodysplastic syndrome, unspecified: Secondary | ICD-10-CM | POA: Diagnosis present

## 2016-02-21 DIAGNOSIS — Z87891 Personal history of nicotine dependence: Secondary | ICD-10-CM | POA: Diagnosis not present

## 2016-02-21 DIAGNOSIS — I480 Paroxysmal atrial fibrillation: Secondary | ICD-10-CM | POA: Diagnosis present

## 2016-02-21 DIAGNOSIS — D638 Anemia in other chronic diseases classified elsewhere: Secondary | ICD-10-CM | POA: Diagnosis present

## 2016-02-21 DIAGNOSIS — Z923 Personal history of irradiation: Secondary | ICD-10-CM | POA: Diagnosis not present

## 2016-02-21 DIAGNOSIS — E1122 Type 2 diabetes mellitus with diabetic chronic kidney disease: Secondary | ICD-10-CM | POA: Diagnosis present

## 2016-02-21 DIAGNOSIS — R569 Unspecified convulsions: Secondary | ICD-10-CM | POA: Diagnosis present

## 2016-02-21 DIAGNOSIS — R339 Retention of urine, unspecified: Secondary | ICD-10-CM | POA: Diagnosis present

## 2016-02-21 DIAGNOSIS — Z7902 Long term (current) use of antithrombotics/antiplatelets: Secondary | ICD-10-CM | POA: Diagnosis not present

## 2016-02-21 DIAGNOSIS — M7989 Other specified soft tissue disorders: Secondary | ICD-10-CM | POA: Diagnosis not present

## 2016-02-21 DIAGNOSIS — C61 Malignant neoplasm of prostate: Secondary | ICD-10-CM | POA: Diagnosis present

## 2016-02-21 DIAGNOSIS — E785 Hyperlipidemia, unspecified: Secondary | ICD-10-CM | POA: Diagnosis present

## 2016-02-21 DIAGNOSIS — E1143 Type 2 diabetes mellitus with diabetic autonomic (poly)neuropathy: Secondary | ICD-10-CM | POA: Diagnosis present

## 2016-02-21 DIAGNOSIS — N3041 Irradiation cystitis with hematuria: Secondary | ICD-10-CM | POA: Diagnosis present

## 2016-02-21 DIAGNOSIS — I5032 Chronic diastolic (congestive) heart failure: Secondary | ICD-10-CM | POA: Diagnosis present

## 2016-02-21 DIAGNOSIS — Z9221 Personal history of antineoplastic chemotherapy: Secondary | ICD-10-CM | POA: Diagnosis not present

## 2016-02-21 DIAGNOSIS — Y842 Radiological procedure and radiotherapy as the cause of abnormal reaction of the patient, or of later complication, without mention of misadventure at the time of the procedure: Secondary | ICD-10-CM | POA: Diagnosis present

## 2016-02-21 DIAGNOSIS — N184 Chronic kidney disease, stage 4 (severe): Secondary | ICD-10-CM | POA: Diagnosis present

## 2016-02-21 LAB — CBC
HCT: 14.8 % — ABNORMAL LOW (ref 39.0–52.0)
HCT: 19.4 % — ABNORMAL LOW (ref 39.0–52.0)
Hemoglobin: 4.6 g/dL — CL (ref 13.0–17.0)
Hemoglobin: 6.2 g/dL — CL (ref 13.0–17.0)
MCH: 30.1 pg (ref 26.0–34.0)
MCH: 32.6 pg (ref 26.0–34.0)
MCHC: 32 g/dL (ref 30.0–36.0)
MCHC: 31.1 g/dL (ref 30.0–36.0)
MCV: 105 fL — ABNORMAL HIGH (ref 78.0–100.0)
MCV: 94.2 fL (ref 78.0–100.0)
PLATELETS: 86 10*3/uL — AB (ref 150–400)
PLATELETS: 106 10*3/uL — AB (ref 150–400)
RBC: 1.41 MIL/uL — ABNORMAL LOW (ref 4.22–5.81)
RBC: 2.06 MIL/uL — AB (ref 4.22–5.81)
RDW: 23.2 % — ABNORMAL HIGH (ref 11.5–15.5)
RDW: 25.3 % — ABNORMAL HIGH (ref 11.5–15.5)
WBC: 6.8 10*3/uL (ref 4.0–10.5)
WBC: 7.1 10*3/uL (ref 4.0–10.5)

## 2016-02-21 LAB — GLUCOSE, CAPILLARY
Glucose-Capillary: 114 mg/dL — ABNORMAL HIGH (ref 65–99)
Glucose-Capillary: 111 mg/dL — ABNORMAL HIGH (ref 65–99)
Glucose-Capillary: 150 mg/dL — ABNORMAL HIGH (ref 65–99)
Glucose-Capillary: 156 mg/dL — ABNORMAL HIGH (ref 65–99)
Glucose-Capillary: 163 mg/dL — ABNORMAL HIGH (ref 65–99)

## 2016-02-21 LAB — TROPONIN I: TROPONIN I: 0.05 ng/mL — AB (ref <0.03)

## 2016-02-21 LAB — HEMOGLOBIN AND HEMATOCRIT, BLOOD
HEMATOCRIT: 25.4 % — AB (ref 39.0–52.0)
Hemoglobin: 8.2 g/dL — ABNORMAL LOW (ref 13.0–17.0)

## 2016-02-21 LAB — MRSA PCR SCREENING: MRSA BY PCR: NEGATIVE

## 2016-02-21 LAB — COMPREHENSIVE METABOLIC PANEL
AST: 21 U/L (ref 15–41)
Albumin: 2.1 g/dL — ABNORMAL LOW (ref 3.5–5.0)
Alkaline Phosphatase: 37 U/L — ABNORMAL LOW (ref 38–126)
Anion gap: 11 (ref 5–15)
BUN: 32 mg/dL — ABNORMAL HIGH (ref 6–20)
CHLORIDE: 110 mmol/L (ref 101–111)
CO2: 23 mmol/L (ref 22–32)
CREATININE: 2.05 mg/dL — AB (ref 0.61–1.24)
Calcium: 7.7 mg/dL — ABNORMAL LOW (ref 8.9–10.3)
GFR calc non Af Amer: 29 mL/min — ABNORMAL LOW (ref >60)
GFR, EST AFRICAN AMERICAN: 34 mL/min — AB (ref >60)
Glucose, Bld: 113 mg/dL — ABNORMAL HIGH (ref 65–99)
Potassium: 3.8 mmol/L (ref 3.5–5.1)
SODIUM: 144 mmol/L (ref 135–145)
Total Bilirubin: 0.7 mg/dL (ref 0.3–1.2)
Total Protein: 5.1 g/dL — ABNORMAL LOW (ref 6.5–8.1)

## 2016-02-21 LAB — PROTIME-INR
INR: 1.3
Prothrombin Time: 16.3 seconds — ABNORMAL HIGH (ref 11.4–15.2)

## 2016-02-21 LAB — LACTIC ACID, PLASMA: LACTIC ACID, VENOUS: 1.5 mmol/L (ref 0.5–1.9)

## 2016-02-21 LAB — PREPARE RBC (CROSSMATCH)

## 2016-02-21 MED ORDER — CARVEDILOL 6.25 MG PO TABS
6.2500 mg | ORAL_TABLET | Freq: Two times a day (BID) | ORAL | Status: DC
  Administered 2016-02-22 – 2016-02-24 (×6): 6.25 mg via ORAL
  Filled 2016-02-21 (×6): qty 1

## 2016-02-21 MED ORDER — HYDRALAZINE HCL 50 MG PO TABS
75.0000 mg | ORAL_TABLET | Freq: Four times a day (QID) | ORAL | Status: DC
  Administered 2016-02-21 – 2016-02-24 (×12): 75 mg via ORAL
  Filled 2016-02-21 (×11): qty 1

## 2016-02-21 MED ORDER — SODIUM CHLORIDE 0.9 % IV SOLN
Freq: Once | INTRAVENOUS | Status: AC
  Administered 2016-02-21: 13:00:00 via INTRAVENOUS

## 2016-02-21 MED ORDER — SODIUM CHLORIDE 0.9% FLUSH
10.0000 mL | INTRAVENOUS | Status: DC | PRN
  Administered 2016-02-23 – 2016-02-24 (×3): 10 mL
  Filled 2016-02-21 (×3): qty 40

## 2016-02-21 MED ORDER — SODIUM CHLORIDE 0.9% FLUSH
10.0000 mL | Freq: Two times a day (BID) | INTRAVENOUS | Status: DC
  Administered 2016-02-22 – 2016-02-23 (×2): 10 mL

## 2016-02-21 NOTE — Progress Notes (Signed)
Subjective:  [name] appears back to his baseline this morning he is awake alert and responsive. There has been no further seizure activity since his admission. It appears his main medical issue at this point is the hematuria. This is being addressed by the urology team. The EEG is pending at this time.  Exam: Vitals:   02/21/16 1302 02/21/16 1326  BP: (!) 160/63 (!) 160/68  Pulse: 73   Resp: 18 18  Temp: 97.1 F (36.2 C) 98.1 F (36.7 C)    HEENT-  Normocephalic, no lesions, without obvious abnormality.  Normal external eye and conjunctiva.  Normal TM's bilaterally.  Normal auditory canals and external ears. Normal external nose, mucus membranes and septum.  Normal pharynx. Cardiovascular- regular rate and rhythm, S1, S2 normal, no murmur, click, rub or gallop, pulses palpable throughout   Lungs- chest clear, no wheezing, rales, normal symmetric air entry, Heart exam - S1, S2 normal, no murmur, no gallop, rate regular Abdomen- soft, non-tender; bowel sounds normal; no masses,  no organomegaly Extremities- less then 2 second capillary refill Lymph-no adenopathy palpable Musculoskeletal-no joint tenderness, deformity or swelling Skin-warm and dry, no hyperpigmentation, vitiligo, or suspicious lesions    Gen: In bed, NAD MS: Awake alert and oriented. CN: Cranial nerves II through XII are intact. There is no facial asymmetry. Motor: 5 out of 5 bilaterally. Sensory: Intact bilaterally. DTR: 1-2+ bilaterally.  Pertinent Labs/Diagnostics: Reviewed.    Impression:   Dialysis appears back to his baseline at this point. He reports no further seizure activity. He is alert awake and responsive. His EEG is pending at this time. The hematuria is being addressed by the urology team.   Recommendations:  1. EEG is pending at this time.  2. We will follow his progress with you.   James A. Tasia Catchings, M.D. [facility]hospitalist Phone: [phone]   02/21/2016, 2:16 PM

## 2016-02-21 NOTE — Progress Notes (Signed)
OT Cancellation Note  Patient Details Name: [name] Sr. MRN: [id number] DOB: Apr 24, 1936   Cancelled Treatment:    Reason Eval/Treat Not Completed: Medical issues which prohibited therapy. Pt with hgb of 6.2. Will follow.  [name] 02/21/2016, 3:43 PM  [phone]

## 2016-02-21 NOTE — Progress Notes (Signed)
Pt to transport by [facility] to [facility]. Report given to RN at [facility]. [facility] was due at 19:15 but still have not arrived. Pt cleaned. Bladder irrigation flushed and emptied.

## 2016-02-21 NOTE — Progress Notes (Signed)
Pt transferred to [facility] via [place]. Army Melia gave report to RN at [facility] at [place] arrived to transfer patient at [facility]. RN gave report to [place]. Patient not complaining of any pain and is in no distress. Patient chart and paperwork given to [facility] for transport.

## 2016-02-21 NOTE — Progress Notes (Signed)
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  Eval 2017 with CT and cysto w/o upper tract masses or bladder tumors. He is on plavix for CAD/PAD but this has been held per pt. Has been considering hyperbaric O2 but not initiated. Plavix being held in house.   2 - Recurrent Prostate Cancer - s/p external beam radiation for unknwon grade / stage disease previously. Biochemical recurrence and on androgen deprivation continuously 2016 with PSA 0.04 most recently.   3 - Urinary Retention - recurrent urinary retention 2017, now managed with indwelling catheter.  4 - Stage 4 Renal Insufficiency - Cr 2.5-5 2017. Imaging x several w/o hydro. He has advanced vascular disease and diabetes.   Today "[name]" is stable. His Hgb is improving. No more urinary retention on irrigation. After consideration of options and discussion with his family, he would like to proceed with operative cysto / clot evacuation / fulgeration with goal of determiing source of bleeding and controlling for his frankly dangerous and hemodynamically significant hematuria.   Objective: Vital signs in last 24 hours: Temp:  [97.5 F (36.4 C)-99 F (37.2 C)] 98.3 F (36.8 C) (08/12 0549) Pulse Rate:  [46-150] 65 (08/12 0855) Resp:  [10-23] 18 (08/12 0549) BP: (129-171)/(47-84) 156/50 (08/12 0855) SpO2:  [92 %-100 %] 99 % (08/12 0855) Weight:  [74.4 kg (164 lb 0.4 oz)] 74.4 kg (164 lb 0.4 oz) (08/11 2215) Last BM Date: 02/20/16  Intake/Output from previous day: 08/11 0701 - 08/12 0700 In: 4903.3 [I.V.:926.3; Blood:977] Out: [id number] [Urine:6675] Intake/Output this shift: Total I/O In: 0  Out: 3225 [Urine:3225]  General appearance: alert, cooperative and appears stated age Head: Normocephalic, without obvious abnormality, atraumatic Eyes: negative Nose: Nares normal. Septum midline. Mucosa normal. No drainage or sinus  tenderness. Throat: lips, mucosa, and tongue normal; teeth and gums normal Neck: supple, symmetrical, trachea midline Back: symmetric, no curvature. ROM normal. No CVA tenderness. Resp: non-labored Cardio: Nl rate GI: soft, non-tender; bowel sounds normal; no masses,  no organomegaly Extremities: extremities normal, atraumatic, no cyanosis or edema Pulses: 2+ and symmetric Skin: Skin color, texture, turgor normal. No rashes or lesions Lymph nodes: Cervical, supraclavicular, and axillary nodes normal. Neurologic: Grossly normal  3 way foley in place on NS irrigation, efflux light pink and without clots.   Lab Results:   Recent Labs  02/20/16 2339 02/21/16 0627  WBC 7.1 6.8  HGB 4.6* 6.2*  HCT 14.8* 19.4*  PLT 106* 86*   BMET  Recent Labs  02/20/16 1430 02/21/16 0627  NA 141 144  K 4.4 3.8  CL 110 110  CO2 21* 23  GLUCOSE 228* 113*  BUN 35* 32*  CREATININE 2.27* 2.05*  CALCIUM 7.7* 7.7*   PT/INR  Recent Labs  02/20/16 1430 02/21/16 0627  LABPROT 16.1* 16.3*  INR 1.28 1.30   ABG  Recent Labs  02/20/16 1656  HCO3 23.1    Studies/Results: Dg Chest Portable 1 View  Result Date: 02/20/2016 CLINICAL DATA:  Stroke. EXAM: PORTABLE CHEST 1 VIEW COMPARISON:  01/28/2016 FINDINGS: Status post median sternotomy. Left-sided PICC line tip overlies the level of the lower superior vena cava. The heart is enlarged. There is streaky atelectasis in the right perihilar region and left lung base. No focal consolidations or evidence for pulmonary edema. IMPRESSION: Cardiomegaly without pulmonary edema. Bibasilar atelectasis. Electronically Signed   By: Nolon Nations M.D.   On: 02/20/2016 17:43   Ct  Head Code Stroke W/o Cm  Result Date: 02/20/2016 CLINICAL DATA:  Code stroke.  Left facial droop.  Blown pupil. EXAM: CT HEAD WITHOUT CONTRAST TECHNIQUE: Contiguous axial images were obtained from the base of the skull through the vertex without intravenous contrast. COMPARISON:   CT head 04/03/2014 FINDINGS: Generalized atrophy. Hypodensity in the periventricular white matter bilaterally similar to the prior study and consistent with chronic microvascular ischemia. Negative for acute infarct. Negative for dense MCA. Atherosclerotic calcification in the vertebral and carotid arteries bilaterally. Negative for hemorrhage or mass. No shift of the midline structures. Negative calvarium. ASPECTS [facility] Stroke Program Early CT Score, http://[email or web address].[email or web address]) - Ganglionic level infarction (caudate, lentiform nuclei, internal capsule, insula, M1-M3 cortex): 7 - Supraganglionic infarction (M4-M6 cortex): 3 Total score (0-10 with 10 being normal): 10 IMPRESSION: 1. No acute intracranial abnormality. Atrophy and chronic ischemic changes. 2. ASPECTS score 10 These results were called by telephone at the time of interpretation on 02/20/2016 at 2:41 pm to Dr. Tasia Catchings, who verbally acknowledged these results. Electronically Signed   By: Franchot Gallo M.D.   On: 02/20/2016 14:45    Anti-infectives: Anti-infectives    None      Assessment/Plan:  1 - Gross Hematuria / Anemia - impressive case. Liekly from ongoing radiation cystitis / recurrent prostate cancer. Very frankly discussed with patient overall chronci nature of this options including purely palliative approach (no further transfusions, procedural intervention) v. Aggressive approach with transfusion and even consider formal operative cysto-clot evac / fulgeration with goal of confirming source of bleeding and then controlling. This would hopefully break cycle of in / out hospital for bleeding.   At this point pt wants to proceed with more aggressive management. Practically, this would need to involve transfusion to hgb >7, transfer to hospital service at [facility] and then likely [place] on Sunday. I will arrange OR.   Continue continuous bladder irrigation in interval. His acute ongoing blood loss does not appear drastic.  2 -  Recurrent Prostate Cancer - most recently under good biochemical control by serum markers.   3 - Urinary Retention - continue catheter for now, not candidate for trial of void this admission.   4 - Stage 4 Renal Insufficiency - likely medical renal disease. This is actually improved somewhat with transfusion.   [facility], Jaysa Kise 02/21/2016

## 2016-02-21 NOTE — Progress Notes (Signed)
PROGRESS NOTE  [name] [id number] DOB: 1936/07/07 DOA: 02/20/2016 PCP: [name] Pac, MD  Brief Summary:   Admitted to [place] w seizures, Hematuria, acute on chronic anemia, s/p 4 Units blood. H/o mds closely followed by Dr Alvy Bimler, recent h/o pacemaker extraction due to bacteremia, with sinus brady and sinus pause over night, d/c clonidine, reduced coreg  Transfer to [place] for urology procedure on 8/13     HPI/Recap of past 24 hours:  Feeling better after prbc transfusion, currently denies pain  Has bradycardia over night, bp stable, denies chest pain , no sob, no dizziness  Assessment/Plan: Active Problems:   PAF (paroxysmal atrial fibrillation) (HCC)   Chronic diastolic heart failure (HCC)   Diabetes mellitus ([place])   Essential hypertension   CKD (chronic kidney disease), stage IV (HCC)   Anemia of chronic disease   MDS (myelodysplastic syndrome) (HCC)   Thrombocytopenia (HCC)   Leukopenia   Hematuria   Seizures ([name])  Acute on chronic anemia: likely combination of blood loss anemia from hematuria and MDS S/p 4units prbc, patient is to transfer to [place] for urologic intervention on 8/13  Hematuria with bladder mass: urology consulted,  Seizure: first time, happened when he was walking with physical therapy, neurology consulted , will follow recommendations  H/o PAF, sinus rhythm with sinus brady overnight, will d/c  Clonidine, decrease coreg, continue amiodarone, h/o pacemaker which was extracted a few weeks ago due to mrsa bacteremia.  Not on anticoagulation, due to h/o GI bleed and now hematuria  HTN; increase hydralazine, continue imdur, lasix,d/c clonidine and decrease coreg due to bradycardia  H/o diastolic chf, currently euvolemic, continue lasix, may need prn lasix due to blood transfusion, close monitor volume status  MDS: getting aranesp q2weeks, followed by Dr Alvy Bimler  H/o diet controlled dm2/HLD/ ckd II/III, liddle's  syndrome, stable, monitor cr, lytes and blood sugar Recent h/o mrsa bacteremia, finished abx treatment on 8/3.  Code Status: DNR  Family Communication: patient   Disposition Plan: transfer to [facility] long   Consultants:  urology  Procedures:  none  Antibiotics:  none   Objective: BP (!) 156/50   Pulse 65   Temp 98.3 F (36.8 C) (Oral)   Resp 18   Ht 5\' 10"  (1.778 m)   Wt 74.4 kg (164 lb 0.4 oz)   SpO2 99%   BMI 23.53 kg/m   Intake/Output Summary (Last 24 hours) at 02/21/16 1216 Last data filed at 02/21/16 1048  Gross per 24 hour  Intake          5903.25 ml  Output            11375 ml  Net         -5471.75 ml   Filed Weights   02/20/16 2215  Weight: 74.4 kg (164 lb 0.4 oz)    Exam:   General:  NAD, visible hematuria in foley bag  Cardiovascular: RRR  Respiratory: CTABL  Abdomen: Soft/ND/NT, positive BS  Musculoskeletal: No Edema  Neuro: aaox3  Data Reviewed: Basic Metabolic Panel:  Recent Labs Lab 02/20/16 1430 02/21/16 0627  NA 141 144  K 4.4 3.8  CL 110 110  CO2 21* 23  GLUCOSE 228* 113*  BUN 35* 32*  CREATININE 2.27* 2.05*  CALCIUM 7.7* 7.7*   Liver Function Tests:  Recent Labs Lab 02/20/16 1430 02/21/16 0627  AST 27 21  ALT 6* <5*  ALKPHOS 44 37*  BILITOT 0.2* 0.7  PROT 5.4* 5.1*  ALBUMIN  2.2* 2.1*   No results for input(s): LIPASE, AMYLASE in the last 168 hours. No results for input(s): AMMONIA in the last 168 hours. CBC:  Recent Labs Lab 02/20/16 1645 02/20/16 2339 02/21/16 0627  WBC 7.4 7.1 6.8  NEUTROABS 5.9  --   --   HGB 3.4* 4.6* 6.2*  HCT 12.2* 14.8* 19.4*  MCV 113.0* 105.0* 94.2  PLT 113* 106* 86*   Cardiac Enzymes:    Recent Labs Lab 02/20/16 1816 02/21/16 0627  TROPONINI 0.04* 0.05*   BNP (last 3 results) No results for input(s): BNP in the last 8760 hours.  ProBNP (last 3 results) No results for input(s): PROBNP in the last 8760 hours.  CBG:  Recent Labs Lab 02/20/16 1423  02/20/16 2334 02/21/16 0806 02/21/16 1203  GLUCAP 223* 121* 114* 111*    Recent Results (from the past 240 hour(s))  MRSA PCR Screening     Status: None   Collection Time: 02/20/16 10:43 PM  Result Value Ref Range Status   MRSA by PCR NEGATIVE NEGATIVE Final    Comment:        The GeneXpert MRSA Assay (FDA approved for NASAL specimens only), is one component of a comprehensive MRSA colonization surveillance program. It is not intended to diagnose MRSA infection nor to guide or monitor treatment for MRSA infections.      Studies: Dg Chest Portable 1 View  Result Date: 02/20/2016 CLINICAL DATA:  Stroke. EXAM: PORTABLE CHEST 1 VIEW COMPARISON:  01/28/2016 FINDINGS: Status post median sternotomy. Left-sided PICC line tip overlies the level of the lower superior vena cava. The heart is enlarged. There is streaky atelectasis in the right perihilar region and left lung base. No focal consolidations or evidence for pulmonary edema. IMPRESSION: Cardiomegaly without pulmonary edema. Bibasilar atelectasis. Electronically Signed   By: Nolon Nations M.D.   On: 02/20/2016 17:43   Ct Head Code Stroke W/o Cm  Result Date: 02/20/2016 CLINICAL DATA:  Code stroke.  Left facial droop.  Blown pupil. EXAM: CT HEAD WITHOUT CONTRAST TECHNIQUE: Contiguous axial images were obtained from the base of the skull through the vertex without intravenous contrast. COMPARISON:  CT head 04/03/2014 FINDINGS: Generalized atrophy. Hypodensity in the periventricular white matter bilaterally similar to the prior study and consistent with chronic microvascular ischemia. Negative for acute infarct. Negative for dense MCA. Atherosclerotic calcification in the vertebral and carotid arteries bilaterally. Negative for hemorrhage or mass. No shift of the midline structures. Negative calvarium. ASPECTS [facility] Stroke Program Early CT Score, http://www.[email or web address]) - Ganglionic level infarction (caudate, lentiform  nuclei, internal capsule, insula, M1-M3 cortex): 7 - Supraganglionic infarction (M4-M6 cortex): 3 Total score (0-10 with 10 being normal): 10 IMPRESSION: 1. No acute intracranial abnormality. Atrophy and chronic ischemic changes. 2. ASPECTS score 10 These results were called by telephone at the time of interpretation on 02/20/2016 at 2:41 pm to Dr. Tasia Catchings, who verbally acknowledged these results. Electronically Signed   By: Franchot Gallo M.D.   On: 02/20/2016 14:45    Scheduled Meds: . sodium chloride   Intravenous Once  . sodium chloride   Intravenous Once  . sodium chloride   Intravenous Once  . amiodarone  100 mg Oral Daily  . atorvastatin  80 mg Oral QHS  . Calcifediol ER  30 mcg Oral QHS  . carvedilol  6.25 mg Oral BID WC  . furosemide  80 mg Oral Daily  . hydrALAZINE  75 mg Oral QID  . insulin aspart  0-9 Units Subcutaneous TID WC  .  isosorbide mononitrate  120 mg Oral Daily  . pantoprazole  40 mg Oral Daily  . sodium bicarbonate  650 mg Oral BID  . sodium chloride flush  10-40 mL Intracatheter Q12H  . sodium chloride flush  3 mL Intravenous Q12H  . tamsulosin  0.4 mg Oral QPC supper    Continuous Infusions: . sodium chloride 75 mL/hr at 02/21/16 0600     Time spent: 34mins  Dacy Enrico MD, PhD  [facility] Pager [phone]. If 7PM-7AM, please contact night-coverage at [email or web address], password [facility] 02/21/2016, 12:16 PM  LOS: 0 days

## 2016-02-22 ENCOUNTER — Encounter (HOSPITAL_COMMUNITY): Payer: Self-pay | Admitting: Anesthesiology

## 2016-02-22 ENCOUNTER — Inpatient Hospital Stay (HOSPITAL_COMMUNITY): Payer: Medicare Other | Admitting: Anesthesiology

## 2016-02-22 ENCOUNTER — Encounter (HOSPITAL_COMMUNITY)
Admission: EM | Disposition: A | Discharge status: Skilled Nursing Facility | Payer: Self-pay | Source: Home / Self Care | Attending: Internal Medicine

## 2016-02-22 DIAGNOSIS — I480 Paroxysmal atrial fibrillation: Secondary | ICD-10-CM

## 2016-02-22 DIAGNOSIS — N184 Chronic kidney disease, stage 4 (severe): Secondary | ICD-10-CM

## 2016-02-22 DIAGNOSIS — E1159 Type 2 diabetes mellitus with other circulatory complications: Secondary | ICD-10-CM

## 2016-02-22 DIAGNOSIS — D638 Anemia in other chronic diseases classified elsewhere: Secondary | ICD-10-CM

## 2016-02-22 DIAGNOSIS — D469 Myelodysplastic syndrome, unspecified: Secondary | ICD-10-CM

## 2016-02-22 DIAGNOSIS — R319 Hematuria, unspecified: Secondary | ICD-10-CM

## 2016-02-22 DIAGNOSIS — D696 Thrombocytopenia, unspecified: Secondary | ICD-10-CM

## 2016-02-22 DIAGNOSIS — I10 Essential (primary) hypertension: Secondary | ICD-10-CM

## 2016-02-22 DIAGNOSIS — D72819 Decreased white blood cell count, unspecified: Secondary | ICD-10-CM

## 2016-02-22 DIAGNOSIS — I5032 Chronic diastolic (congestive) heart failure: Secondary | ICD-10-CM

## 2016-02-22 HISTORY — PX: TRANSURETHRAL RESECTION OF PROSTATE: SHX73

## 2016-02-22 LAB — CBC
HEMATOCRIT: 24.6 % — AB (ref 39.0–52.0)
HEMOGLOBIN: 8.2 g/dL — AB (ref 13.0–17.0)
MCH: 31.2 pg (ref 26.0–34.0)
MCHC: 33.3 g/dL (ref 30.0–36.0)
MCV: 93.5 fL (ref 78.0–100.0)
Platelets: 87 10*3/uL — ABNORMAL LOW (ref 150–400)
RBC: 2.63 MIL/uL — ABNORMAL LOW (ref 4.22–5.81)
RDW: 24.6 % — AB (ref 11.5–15.5)
WBC: 8.7 10*3/uL (ref 4.0–10.5)

## 2016-02-22 LAB — GLUCOSE, CAPILLARY
Glucose-Capillary: 120 mg/dL — ABNORMAL HIGH (ref 65–99)
Glucose-Capillary: 116 mg/dL — ABNORMAL HIGH (ref 65–99)
Glucose-Capillary: 123 mg/dL — ABNORMAL HIGH (ref 65–99)
Glucose-Capillary: 137 mg/dL — ABNORMAL HIGH (ref 65–99)

## 2016-02-22 SURGERY — TURP (TRANSURETHRAL RESECTION OF PROSTATE)
Anesthesia: General | Site: Prostate

## 2016-02-22 MED ORDER — PHENYLEPHRINE 40 MCG/ML (10ML) SYRINGE FOR IV PUSH (FOR BLOOD PRESSURE SUPPORT)
PREFILLED_SYRINGE | INTRAVENOUS | Status: AC
  Filled 2016-02-22: qty 10

## 2016-02-22 MED ORDER — FENTANYL CITRATE (PF) 100 MCG/2ML IJ SOLN
25.0000 ug | INTRAMUSCULAR | Status: DC | PRN
  Administered 2016-02-22: 50 ug via INTRAVENOUS

## 2016-02-22 MED ORDER — ALBUMIN HUMAN 5 % IV SOLN
INTRAVENOUS | Status: AC
  Filled 2016-02-22: qty 250

## 2016-02-22 MED ORDER — SODIUM CHLORIDE 0.9 % IR SOLN
Status: DC | PRN
Start: 2016-02-22 — End: 2016-02-22
  Administered 2016-02-22: 12000 mL

## 2016-02-22 MED ORDER — DEXTROSE 5 % IV SOLN
2.0000 g | Freq: Once | INTRAVENOUS | Status: AC
  Administered 2016-02-22: 2 g via INTRAVENOUS

## 2016-02-22 MED ORDER — FENTANYL CITRATE (PF) 100 MCG/2ML IJ SOLN
INTRAMUSCULAR | Status: DC | PRN
  Administered 2016-02-22 (×2): 25 ug via INTRAVENOUS

## 2016-02-22 MED ORDER — DEXTROSE 5 % IV SOLN: 1.0000 g | Freq: Once | INTRAVENOUS | Status: DC

## 2016-02-22 MED ORDER — SODIUM CHLORIDE 0.9 % IJ SOLN
INTRAMUSCULAR | Status: AC
  Filled 2016-02-22: qty 10

## 2016-02-22 MED ORDER — SODIUM CHLORIDE 0.9 % IV SOLN
INTRAVENOUS | Status: DC | PRN
  Administered 2016-02-22: 11:00:00 via INTRAVENOUS

## 2016-02-22 MED ORDER — DEXTROSE 5 % IV SOLN
INTRAVENOUS | Status: AC
  Filled 2016-02-22: qty 2

## 2016-02-22 MED ORDER — PHENYLEPHRINE HCL 10 MG/ML IJ SOLN
INTRAMUSCULAR | Status: AC
  Filled 2016-02-22: qty 1

## 2016-02-22 MED ORDER — FENTANYL CITRATE (PF) 100 MCG/2ML IJ SOLN: 25.0000 ug | INTRAMUSCULAR | Status: DC | PRN

## 2016-02-22 MED ORDER — LIDOCAINE HCL (CARDIAC) 20 MG/ML IV SOLN
INTRAVENOUS | Status: AC
  Filled 2016-02-22: qty 5

## 2016-02-22 MED ORDER — PHENYLEPHRINE HCL 10 MG/ML IJ SOLN
INTRAMUSCULAR | Status: DC | PRN
Start: 2016-02-22 — End: 2016-02-22
  Administered 2016-02-22 (×2): 80 ug via INTRAVENOUS

## 2016-02-22 MED ORDER — ALBUMIN HUMAN 5 % IV SOLN
INTRAVENOUS | Status: DC | PRN
  Administered 2016-02-22 (×2): via INTRAVENOUS

## 2016-02-22 MED ORDER — LIDOCAINE 2% (20 MG/ML) 5 ML SYRINGE
INTRAMUSCULAR | Status: DC | PRN
  Administered 2016-02-22: 100 mg via INTRAVENOUS

## 2016-02-22 MED ORDER — PROPOFOL 10 MG/ML IV BOLUS
INTRAVENOUS | Status: AC
  Filled 2016-02-22: qty 20

## 2016-02-22 MED ORDER — PROPOFOL 10 MG/ML IV BOLUS
INTRAVENOUS | Status: DC | PRN
  Administered 2016-02-22: 150 mg via INTRAVENOUS

## 2016-02-22 MED ORDER — FENTANYL CITRATE (PF) 100 MCG/2ML IJ SOLN
INTRAMUSCULAR | Status: AC
  Filled 2016-02-22: qty 2

## 2016-02-22 MED ORDER — DEXTROSE 5 % IV SOLN
INTRAVENOUS | Status: DC | PRN
  Administered 2016-02-22: 50 ug/min via INTRAVENOUS

## 2016-02-22 SURGICAL SUPPLY — 19 items
BAG URINE DRAINAGE (UROLOGICAL SUPPLIES) ×3 IMPLANT
BAG URO CATCHER STRL LF (MISCELLANEOUS) ×3 IMPLANT
CATH FOLEY 3WAY 30CC 22FR (CATHETERS) ×3 IMPLANT
CATH HEMA 3WAY 30CC 22FR COUDE (CATHETERS) IMPLANT
ELECT REM PT RETURN 9FT ADLT (ELECTROSURGICAL) ×3
ELECTRODE REM PT RTRN 9FT ADLT (ELECTROSURGICAL) ×1 IMPLANT
GLOVE BIOGEL M STRL SZ7.5 (GLOVE) ×3 IMPLANT
GOWN STRL REUS W/TWL LRG LVL3 (GOWN DISPOSABLE) ×3 IMPLANT
GOWN STRL REUS W/TWL XL LVL3 (GOWN DISPOSABLE) ×3 IMPLANT
HOLDER FOLEY CATH W/STRAP (MISCELLANEOUS) ×3 IMPLANT
KIT BASIN OR (CUSTOM PROCEDURE TRAY) ×3 IMPLANT
KIT FLEXISEAL FECAL MGMNT (GAUZE/BANDAGES/DRESSINGS) ×3 IMPLANT
LOOP CUT BIPOLAR 24F LRG (ELECTROSURGICAL) ×3 IMPLANT
MANIFOLD NEPTUNE II (INSTRUMENTS) ×3 IMPLANT
PACK CYSTO (CUSTOM PROCEDURE TRAY) ×3 IMPLANT
SYRINGE IRR TOOMEY STRL 70CC (SYRINGE) ×3 IMPLANT
TUBING CONNECTING 10 (TUBING) ×2 IMPLANT
TUBING CONNECTING 10' (TUBING) ×1
WATER STERILE IRR 500ML POUR (IV SOLUTION) ×3 IMPLANT

## 2016-02-22 NOTE — Transfer of Care (Signed)
Immediate Anesthesia Transfer of Care Note  Patient: [name] Marble Sr.  Procedure(s) Performed: Procedure(s): CYSTO, CLOT EVACUATION, FULGERATION (N/A)  Patient Location: PACU  Anesthesia Type:General  Level of Consciousness: awake  Airway & Oxygen Therapy: Patient Spontanous Breathing and Patient connected to face mask oxygen  Post-op Assessment: Report given to RN and Post -op Vital signs reviewed and stable  Post vital signs: Reviewed and stable  Last Vitals:  Vitals:   02/21/16 2243 02/22/16 0646  BP: (!) 168/69 (!) 168/89  Pulse: 81 80  Resp: 20 20  Temp: 37.1 C 37.1 C    Last Pain:  Vitals:   02/22/16 0815  TempSrc:   PainSc: 0-No pain         Complications: No apparent anesthesia complications

## 2016-02-22 NOTE — Progress Notes (Signed)
OT Cancellation Note  Patient Details Name: [name] Sr. MRN: [id number] DOB: 07-30-1935   Cancelled Treatment:    Reason Eval/Treat Not Completed: Medical issues which prohibited therapy -- Plan is for pt to go to OR for surgery today. Will follow up for OT evaluation at a later time.  Lovie Zarling A 02/22/2016, 7:35 AM

## 2016-02-22 NOTE — Anesthesia Postprocedure Evaluation (Signed)
Anesthesia Post Note  Patient: [name] SINNER Sr.  Procedure(s) Performed: Procedure(s) (LRB): CYSTO, CLOT EVACUATION, FULGERATION (N/A)  Patient location during evaluation: PACU Anesthesia Type: General Level of consciousness: awake and alert Pain management: pain level controlled Vital Signs Assessment: post-procedure vital signs reviewed and stable Respiratory status: spontaneous breathing, nonlabored ventilation and respiratory function stable Cardiovascular status: blood pressure returned to baseline and stable Postop Assessment: no signs of nausea or vomiting Anesthetic complications: no    Last Vitals:  Vitals:   02/22/16 1230 02/22/16 1245  BP: (!) 165/85 (!) 186/91  Pulse: 73 70  Resp:  14  Temp: 36.6 C 36.8 C    Last Pain:  Vitals:   02/22/16 1230  TempSrc:   PainSc: Asleep                 Kala Gassmann,W. EDMOND

## 2016-02-22 NOTE — Op Note (Signed)
NAMEBAILY, [name] NO.:  [id number]  MEDICAL RECORD NO.:  [id number]  LOCATION:  [facility]                         FACILITY:  [facility]  PHYSICIAN:  Alexis Frock, MD     DATE OF BIRTH:  April 08, 1936  DATE OF PROCEDURE:  02/22/2016                              OPERATIVE REPORT  PREOPERATIVE DIAGNOSIS:  Recurrent clot urinary retention, hematuria with hemodynamically significant bleeding.  POSTOPERATIVE DIAGNOSES:  Recurrent clot urinary retention, hematuria with hemodynamically significant bleeding plus radiation cystitis.  PROCEDURE:  Cystoscopy, clot evacuation, fulguration of bleeders.  SURGEON:  Alexis Frock, M.D.  ESTIMATED BLOOD LOSS:  100 mL of old formed clot.  Scant new bleeding.  PREOPERATIVE FINDINGS: 1. Diffuse radiation cystitis changes throughout the urinary bladder ,     bladder neck prostatic fossa consistent with prior external beam     radiation. 2. Complete resolution of all mucosal bleeding following fulguration.  DRAINS:  A 22-French 3-way Foley catheter to straight drain, irrigation port was plugged.  INDICATION:  [name] is a pleasant 80 year old gentleman with remote history of prostate cancer treated with external beam radiation many years ago.  He unfortunately has developed significant gross hematuria on and off for the past several months presumably due to radiation cystitis changes.  He most recently has been catheter dependent for recurrent clot urinary retention.  He presented to the [facility] Emergency Room several days ago with suspected seizure activity and was found to have a hemoglobin near 3 due to likely ongoing genitourinary losses.  He has been aggressively transfused with hemoglobin greater than 8.  Hematuria persists.  Options were discussed including operative cystoscopy and clot evacuation for diagnostic and therapeutic intent and he wished to proceed.  Informed consent was obtained and placed in the medical  record.  PROCEDURE IN DETAIL:  The patient being [name] and procedure being cystoscopy, clot evacuation, fulguration of bleeders confirmed. Procedure was carried out.  Time-out was performed.  Intravenous antibiotics were administered.  General LMA anesthesia introduced.  The patient placed into a low lithotomy position.  Sterile field was created by prepping and draping the patient's penis, perineum, proximal thighs using iodine x3.  The patient does have significant penoscrotal edema. He is uncircumcised.  A cystourethroscopy was performed using 26-French resectoscope sheath with visual obturator and offset lens.  Inspection of the anterior and posterior urethra was unremarkable.  The patient had a wide open prostatic fossa without obvious presentable obstruction. Inspection of the bladder revealed a significant portion of old formed clot.  This was irrigated of approximately 100 mL of clot.  Inspection of the mucosal surface of the bladder revealed diffuse radiation cystitis changes with ectatic, very friable vessels throughout. Ureteral orifices appeared singleton bilaterally.  Next, using the resectoscope loop, very careful fulguration current was applied to all areas of mucosal bleeding and ectatic vessels throughout the urinary bladder in systematic fashion which completely resolved all active mucosal bleeding.  Notably, there was not any one single focal bleeder but more diffuse process.  Following these maneuvers, hemostasis appeared excellent.  There was no evidence of bladder perforation. Ureteral orifices were inspected and found to be visibly intact and uninjured.  Resectoscope was  exchanged for a new 22-French 3-way Foley catheter, 20 mL sterile water in the balloon.  Irrigation port was plugged.  The efflux was essentially clear and the procedure terminated. The patient tolerated the procedure well with no immediate periprocedural complications.  The patient was  taken to the postanesthesia care unit in stable condition.          ______________________________ Alexis Frock, MD     TM/MEDQ  D:  02/22/2016  T:  02/22/2016  Job:  [id number]

## 2016-02-22 NOTE — Progress Notes (Addendum)
Progress Note    [name]  [id number] DOB: [birth year]  DOA: 02/20/2016 PCP: [name], MD    Brief Narrative:   [name]. is an 80 y.o. male with a PMH of chronic diastolic CHF, diabetes, hypertension, stage IV CK D, myelodysplastic syndrome and PAF who was admitted 02/20/16 with new onset seizures. Upon initial evaluation in the ED, he was found to be profoundly anemic with a hemoglobin of 3.4 in the setting of chronic hematuria. He was transfused 4 units of PRBCs and urologist/neurologist consulted for assistance with management.  Assessment/Plan:   Principal Problems:   Seizure ([place]) CT of the brain was performed and revealed no evidence of hemorrhage, or mass lesion, or prior ischemic change. There was evidence of a mild to moderate diffuse atrophy.Evaluated by neurologist with recommendations to proceed with seizure precautions, EEG, speech evaluation, and frequent neurologic checks. No antiepileptic medications were initiated given that this was his first seizure.    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 He has received a total of 4 units PRBCs. After discussion by Dr. Tresa Moore with the patient and his family regarding options for treatment, they have elected aggressive therapy with plans for operative cysto-clot evacuation / fulgeration with goal of confirming source of bleeding and then controlling. He is currently receiving continuous bladder irrigation. Hemoglobin stable at 8.2.  Active Problems:   Left upper extremity swelling Check Doppler rule out DVT.    PAF (paroxysmal atrial fibrillation) (HCC)/sinus bradycardia/status post  pacemaker extraction secondary to history of MRSA bacteremia/elevated troponin consistent with demand ischemia Clearly not a candidate for blood thinners. Due to bradycardia, clonidine was discontinued and his dose of Coreg was decreased. Continue amiodarone. History of recent pacemaker extraction secondary to MRSA bacteremia. He completed his course of antibiotic therapy on 02/12/16. Mild elevation of troponins noted, trend is flat. These findings are most consistent with demand ischemia.    Chronic diastolic heart failure (HCC) Monitor I/O and daily weights. Weight currently stable. Continue Lasix.    Diabetes mellitus ([place]) Diet controlled at baseline. Currently being managed with insulin sensitive SSI.    Essential hypertension Continue Coreg, Lasix, Imdur and hydralazine. Blood pressure running high.    CKD (chronic kidney disease), stage IV (HCC) Baseline creatinine appears to be around 3-4. Current creatinine is 2.05. Suspect he had some chronic obstruction, possibly from blood clots. Creatinine now improved.    MDS (myelodysplastic syndrome) (HCC) with chronic pancytopenia/anemia/leukopenia/thrombocytopenia   Thrombocytopenia (HCC) Patient is followed by Dr. Alvy Bimler. Receives Aranesp every 2 weeks at the cancer center.    Hyperlipidemia Continue Lipitor.   Family Communication/Anticipated D/C date and plan/Code Status   DVT prophylaxis: Lovenox ordered. Code Status: DO NOT RESUSCITATE.  Family Communication: Multiple family updated at the bedside. Disposition Plan: Hopefully home in 24-48 hours.   Medical Consultants:    Urology   Procedures:    Cystoscopy, clot evacuation, fulguration of bleeders 02/22/16  Anti-Infectives:   None.  Subjective:    [name] Marble Sr. is a bit sleepy. Reports left upper extremity swelling and pain. No nausea or vomiting. No complaints of bladder cramps.  Objective:    Vitals:   02/22/16 1215 02/22/16 1225 02/22/16 1230  02/22/16 1245  BP: (!) 176/71 (!) 159/83 (!) 165/85 (!) 186/91  Pulse: 62 64 73 70  Resp:  13  14  Temp:   97.8 F (36.6 C) 98.3 F (36.8 C)  TempSrc:      SpO2: 97% 97% 98% 94%  Weight:      Height:        Intake/Output Summary (Last 24 hours) at 02/22/16 1421 Last data filed at 02/22/16 1139  Gross per 24 hour  Intake            20338 ml  Output            27550 ml  Net            -7212 ml   Filed Weights   02/20/16 2215 02/21/16 2243 02/22/16 0646  Weight: 74.4 kg (164 lb 0.4 oz) 83.9 kg (184 lb 15.5 oz) 83.8 kg (184 lb 11.9 oz)    Exam: General exam: Appears calm and comfortable.  Respiratory system: Clear to auscultation. Respiratory effort normal. Cardiovascular system: S1 & S2 heard, RRR. No JVD,  rubs, gallops or clicks. No murmurs. Gastrointestinal system: Abdomen is nondistended, soft and nontender. No organomegaly or masses felt. Normal bowel sounds heard. Central nervous system: Alert and oriented. No focal neurological deficits. Extremities: Left upper extremity swelling and pain. Skin: No rashes, lesions or ulcers. Psychiatry: Judgement and insight appear diminished. Mood & affect flat.   Data Reviewed:   I have personally reviewed following labs and imaging studies:  Labs: Basic Metabolic Panel:  Recent Labs Lab 02/20/16 1430 02/21/16 0627  NA 141 144  K 4.4 3.8  CL 110 110  CO2 21* 23  GLUCOSE 228* 113*  BUN 35* 32*  CREATININE 2.27* 2.05*  CALCIUM 7.7* 7.7*   GFR Estimated Creatinine Clearance: 30.7 mL/min (by C-G formula based on SCr of 2.05 mg/dL). Liver Function Tests:  Recent Labs Lab 02/20/16 1430 02/21/16 0627  AST 27 21  ALT 6* <5*  ALKPHOS 44 37*  BILITOT 0.2* 0.7  PROT 5.4* 5.1*  ALBUMIN 2.2* 2.1*   Coagulation profile  Recent Labs Lab 02/20/16 1430 02/21/16 0627  INR 1.28 1.30    CBC:  Recent Labs Lab 02/20/16 1645 02/20/16 2339 02/21/16 0627 02/21/16 1820 02/22/16 0850  WBC 7.4 7.1 6.8  --  8.7    NEUTROABS 5.9  --   --   --   --   HGB 3.4* 4.6* 6.2* 8.2* 8.2*  HCT 12.2* 14.8* 19.4* 25.4* 24.6*  MCV 113.0* 105.0* 94.2  --  93.5  PLT 113* 106* 86*  --  87*   Cardiac Enzymes:  Recent Labs Lab 02/20/16 1816 02/21/16 0627  TROPONINI 0.04* 0.05*   CBG:  Recent Labs Lab 02/21/16 1711 02/21/16 2138 02/21/16 2220 02/22/16 0736 02/22/16 1159  GLUCAP 150* 156* 163* 120* 116*   Sepsis Labs:  Recent Labs Lab 02/20/16 1645 02/20/16 1847 02/20/16 2339 02/21/16 0627 02/22/16 0850  WBC 7.4  --  7.1 6.8 8.7  LATICACIDVEN  --  2.3* 1.5  --   --     Microbiology Recent Results (from the past 240 hour(s))  MRSA PCR Screening     Status: None   Collection Time: 02/20/16 10:43 PM  Result Value Ref Range Status   MRSA by PCR NEGATIVE NEGATIVE Final    Comment:        The GeneXpert MRSA Assay (FDA approved for NASAL specimens only), is one component of a comprehensive MRSA colonization surveillance  program. It is not intended to diagnose MRSA infection nor to guide or monitor treatment for MRSA infections.   Culture, blood (Routine X 2) w Reflex to ID Panel     Status: None (Preliminary result)   Collection Time: 02/20/16 11:28 PM  Result Value Ref Range Status   Specimen Description BLOOD RIGHT HAND  Final   Special Requests IN PEDIATRIC BOTTLE 0.5CC  Final   Culture NO GROWTH 1 DAY  Final   Report Status PENDING  Incomplete  Culture, blood (Routine X 2) w Reflex to ID Panel     Status: None (Preliminary result)   Collection Time: 02/20/16 11:35 PM  Result Value Ref Range Status   Specimen Description BLOOD RIGHT HAND  Final   Special Requests BOTTLES DRAWN AEROBIC ONLY 8CC  Final   Culture NO GROWTH 1 DAY  Final   Report Status PENDING  Incomplete    Radiology: Dg Chest Portable 1 View  Result Date: 02/20/2016 CLINICAL DATA:  Stroke. EXAM: PORTABLE CHEST 1 VIEW COMPARISON:  01/28/2016 FINDINGS: Status post median sternotomy. Left-sided PICC line tip  overlies the level of the lower superior vena cava. The heart is enlarged. There is streaky atelectasis in the right perihilar region and left lung base. No focal consolidations or evidence for pulmonary edema. IMPRESSION: Cardiomegaly without pulmonary edema. Bibasilar atelectasis. Electronically Signed   By: Nolon Nations M.D.   On: 02/20/2016 17:43   Ct Head Code Stroke W/o Cm  Result Date: 02/20/2016 CLINICAL DATA:  Code stroke.  Left facial droop.  Blown pupil. EXAM: CT HEAD WITHOUT CONTRAST TECHNIQUE: Contiguous axial images were obtained from the base of the skull through the vertex without intravenous contrast. COMPARISON:  CT head 04/03/2014 FINDINGS: Generalized atrophy. Hypodensity in the periventricular white matter bilaterally similar to the prior study and consistent with chronic microvascular ischemia. Negative for acute infarct. Negative for dense MCA. Atherosclerotic calcification in the vertebral and carotid arteries bilaterally. Negative for hemorrhage or mass. No shift of the midline structures. Negative calvarium. ASPECTS [facility] Stroke Program Early CT Score, [email or web address]) - Ganglionic level infarction (caudate, lentiform nuclei, internal capsule, insula, M1-M3 cortex): 7 - Supraganglionic infarction (M4-M6 cortex): 3 Total score (0-10 with 10 being normal): 10 IMPRESSION: 1. No acute intracranial abnormality. Atrophy and chronic ischemic changes. 2. ASPECTS score 10 These results were called by telephone at the time of interpretation on 02/20/2016 at 2:41 pm to Dr. Tasia Catchings, who verbally acknowledged these results. Electronically Signed   By: Franchot Gallo M.D.   On: 02/20/2016 14:45    Medications:   . sodium chloride   Intravenous Once  . amiodarone  100 mg Oral Daily  . atorvastatin  80 mg Oral QHS  . Calcifediol ER  30 mcg Oral QHS  . carvedilol  6.25 mg Oral BID WC  . fentaNYL      . furosemide  80 mg Oral Daily  . hydrALAZINE  75 mg Oral QID  .  insulin aspart  0-9 Units Subcutaneous TID WC  . isosorbide mononitrate  120 mg Oral Daily  . pantoprazole  40 mg Oral Daily  . sodium bicarbonate  650 mg Oral BID  . sodium chloride flush  10-40 mL Intracatheter Q12H  . sodium chloride flush  3 mL Intravenous Q12H  . tamsulosin  0.4 mg Oral QPC supper   Continuous Infusions:   Time spent: 35 minutes with > 50% of time discussing current diagnostic test results, clinical impression and plan of care.   LOS:  1 day   Hillary Schwegler  [facility] Pager [phone]. If unable to reach me by pager, please call my cell phone at [phone].  *Please refer to [email or web address], password [email or web address] to get updated schedule on who will round on this patient, as hospitalists switch teams weekly. If 7PM-7AM, please contact night-coverage at [email or web address], password [email or web address] for any overnight needs.  02/22/2016, 2:21 PM

## 2016-02-22 NOTE — Anesthesia Preprocedure Evaluation (Addendum)
Anesthesia Evaluation  Patient identified by MRN, date of birth, ID band Patient awake    Reviewed: Allergy & Precautions, H&P , NPO status , Patient's Chart, lab work & pertinent test results  Airway Mallampati: II  TM Distance: >3 FB Neck ROM: Full    Dental no notable dental hx. (+) Teeth Intact, Dental Advisory Given   Pulmonary neg pulmonary ROS, former smoker,    Pulmonary exam normal breath sounds clear to auscultation       Cardiovascular hypertension, + CAD, + CABG and + Peripheral Vascular Disease  + pacemaker  Rhythm:Regular Rate:Normal     Neuro/Psych Depression negative neurological ROS  negative psych ROS   GI/Hepatic Neg liver ROS, PUD, GERD  Medicated and Controlled,  Endo/Other  diabetes  Renal/GU Renal InsufficiencyRenal disease  negative genitourinary   Musculoskeletal  (+) Arthritis , Osteoarthritis,    Abdominal   Peds  Hematology negative hematology ROS (+) anemia ,   Anesthesia Other Findings   Reproductive/Obstetrics negative OB ROS                            Anesthesia Physical Anesthesia Plan  ASA: III  Anesthesia Plan: General   Post-op Pain Management:    Induction: Intravenous  Airway Management Planned: LMA  Additional Equipment:   Intra-op Plan:   Post-operative Plan: Extubation in OR  Informed Consent: I have reviewed the patients History and Physical, chart, labs and discussed the procedure including the risks, benefits and alternatives for the proposed anesthesia with the patient or authorized representative who has indicated his/her understanding and acceptance.   Dental advisory given  Plan Discussed with: CRNA  Anesthesia Plan Comments:         Anesthesia Quick Evaluation

## 2016-02-22 NOTE — Anesthesia Procedure Notes (Signed)
Procedure Name: LMA Insertion Date/Time: 02/22/2016 10:21 AM Performed by: Danley Danker L Patient Re-evaluated:Patient Re-evaluated prior to inductionOxygen Delivery Method: Circle system utilized Preoxygenation: Pre-oxygenation with 100% oxygen Intubation Type: IV induction Ventilation: Mask ventilation without difficulty LMA: LMA inserted LMA Size: 4.0 Number of attempts: 1 Placement Confirmation: positive ETCO2 and breath sounds checked- equal and bilateral Tube secured with: Tape Dental Injury: Teeth and Oropharynx as per pre-operative assessment

## 2016-02-22 NOTE — Brief Op Note (Signed)
02/20/2016 - 02/22/2016  11:22 AM  PATIENT:  [name] Marble Sr.  80 y.o. male  PRE-OPERATIVE DIAGNOSIS:  Recurrent Clot Retention  POST-OPERATIVE DIAGNOSIS:  Recurrent clot Retention, Radiation Cystitis   PROCEDURE:  Procedure(s): CYSTO, CLOT EVACUATION, FULGERATION (N/A)  SURGEON:  Surgeon(s) and Role:    * Alexis Frock, MD - Primary  PHYSICIAN ASSISTANT:   ASSISTANTS: none   ANESTHESIA:   general  EBL:  Total I/O In: 3500 [Other:3000; IV Piggyback:500] Out: 1350 [Urine:1250; Blood:100]  BLOOD ADMINISTERED:none  DRAINS: 46F foley to gravity drainage, irrigation port plugged.   LOCAL MEDICATIONS USED:  NONE  SPECIMEN:  No Specimen  DISPOSITION OF SPECIMEN:  N/A  COUNTS:  YES  TOURNIQUET:  * No tourniquets in log *  DICTATION: .Other Dictation: Dictation Number no number given, dictation finshed at 11:29 AM  PLAN OF CARE: Admit to inpatient   PATIENT DISPOSITION:  PACU - hemodynamically stable.   Delay start of Pharmacological VTE agent (>24hrs) due to surgical blood loss or risk of bleeding: yes

## 2016-02-22 NOTE — Progress Notes (Signed)
Day of Surgery  Subjective:  1 - Gross Hematuria / Anemia - long h/o intermitant gross hematuria from likely radiation cystitis / recurrent prostate cancer.  Eval 2017 with CT and cysto w/o upper tract masses or bladder tumors. He is on plavix for CAD/PAD but this has been held per pt. Has been considering hyperbaric O2 but not initiated. Plavix being held in house.   2 - Recurrent Prostate Cancer - s/p external beam radiation for unknwon grade / stage disease previously. Biochemical recurrence and on androgen deprivation continuously 2016 with PSA 0.04 most recently.   3 - Urinary Retention - recurrent urinary retention 2017, now managed with indwelling catheter.  4 - Stage 4 Renal Insufficiency - Cr 2.5-5 2017. Imaging x several w/o hydro. He has advanced vascular disease and diabetes.   Today "[name]" is improving clinically. Transferred to [facility] yesterday.  Ongoing hematuria stable on bladder irrigation. Hgb now >8. Feels much stronger. NPO for surgery today. No interval fevers.   Objective: Vital signs in last 24 hours: Temp:  [97.1 F (36.2 C)-98.8 F (37.1 C)] 98.8 F (37.1 C) (08/13 0646) Pulse Rate:  [62-101] 80 (08/13 0646) Resp:  [18-22] 20 (08/13 0646) BP: (152-206)/(50-108) 168/89 (08/13 0646) SpO2:  [99 %-100 %] 100 % (08/13 0646) Weight:  [83.8 kg (184 lb 11.9 oz)-83.9 kg (184 lb 15.5 oz)] 83.8 kg (184 lb 11.9 oz) (08/13 0646) Last BM Date: 02/21/16  Intake/Output from previous day: 08/12 0701 - 08/13 0700 In: [id number] [P.O.:600; Blood:318] Out: 32700 [Urine:32700] Intake/Output this shift: No intake/output data recorded.  General appearance: alert, cooperative and appears much more vigorous after transfusions Nose: Nares normal. Septum midline. Mucosa normal. No drainage or sinus tenderness. Throat: lips, mucosa, and tongue normal; teeth and gums normal Neck: supple, symmetrical, trachea midline Back: symmetric, no curvature. ROM normal. No CVA  tenderness. Resp: no-labored on room air Cardio: Nl rate GI: soft, non-tender; bowel sounds normal; no masses,  no organomegaly Male genitalia: some foreskin erosion at site of foley, superficial only. Foley c/d/i with dark pink urine on irrigation 3gtt per second.  Extremities: extremities normal, atraumatic, no cyanosis or edema Skin: Skin color, texture, turgor normal. No rashes or lesions Neurologic: Grossly normal  Lab Results:   Recent Labs  02/20/16 2339 02/21/16 0627 02/21/16 1820  WBC 7.1 6.8  --   HGB 4.6* 6.2* 8.2*  HCT 14.8* 19.4* 25.4*  PLT 106* 86*  --    BMET  Recent Labs  02/20/16 1430 02/21/16 0627  NA 141 144  K 4.4 3.8  CL 110 110  CO2 21* 23  GLUCOSE 228* 113*  BUN 35* 32*  CREATININE 2.27* 2.05*  CALCIUM 7.7* 7.7*   PT/INR  Recent Labs  02/20/16 1430 02/21/16 0627  LABPROT 16.1* 16.3*  INR 1.28 1.30   ABG  Recent Labs  02/20/16 1656  HCO3 23.1    Studies/Results: Dg Chest Portable 1 View  Result Date: 02/20/2016 CLINICAL DATA:  Stroke. EXAM: PORTABLE CHEST 1 VIEW COMPARISON:  01/28/2016 FINDINGS: Status post median sternotomy. Left-sided PICC line tip overlies the level of the lower superior vena cava. The heart is enlarged. There is streaky atelectasis in the right perihilar region and left lung base. No focal consolidations or evidence for pulmonary edema. IMPRESSION: Cardiomegaly without pulmonary edema. Bibasilar atelectasis. Electronically Signed   By: Nolon Nations M.D.   On: 02/20/2016 17:43   Ct Head Code Stroke W/o Cm  Result Date: 02/20/2016 CLINICAL DATA:  Code stroke.  Left  facial droop.  Blown pupil. EXAM: CT HEAD WITHOUT CONTRAST TECHNIQUE: Contiguous axial images were obtained from the base of the skull through the vertex without intravenous contrast. COMPARISON:  CT head 04/03/2014 FINDINGS: Generalized atrophy. Hypodensity in the periventricular white matter bilaterally similar to the prior study and consistent with  chronic microvascular ischemia. Negative for acute infarct. Negative for dense MCA. Atherosclerotic calcification in the vertebral and carotid arteries bilaterally. Negative for hemorrhage or mass. No shift of the midline structures. Negative calvarium. ASPECTS [facility] Stroke Program Early CT Score, [email or web address]) - Ganglionic level infarction (caudate, lentiform nuclei, internal capsule, insula, M1-M3 cortex): 7 - Supraganglionic infarction (M4-M6 cortex): 3 Total score (0-10 with 10 being normal): 10 IMPRESSION: 1. No acute intracranial abnormality. Atrophy and chronic ischemic changes. 2. ASPECTS score 10 These results were called by telephone at the time of interpretation on 02/20/2016 at 2:41 pm to Dr. Tasia Catchings, who verbally acknowledged these results. Electronically Signed   By: Franchot Gallo M.D.   On: 02/20/2016 14:45    Anti-infectives: Anti-infectives    Start     Dose/Rate Route Frequency Ordered Stop   02/22/16 0730  cefTRIAXone (ROCEPHIN) 1 g in dextrose 5 % 50 mL IVPB     1 g 100 mL/hr over 30 Minutes Intravenous  Once 02/22/16 [id number]        Assessment/Plan:  1 - Gross Hematuria / Anemia - greatly appreciate combined care from hospitalist team. Hgb much improved. Proceed as planned with operative cysto / clot evacuation / fulgeration.  Risks, benefits, alternatives, expected peri-op course including need to continue foley some post-op discussed.   2 - Recurrent Prostate Cancer - most recently under good biochemical control by serum markers.   3 - Urinary Retention - continue catheter for now, not candidate for trial of void this admission.   4 - Stage 4 Renal Insufficiency - likely medical renal disease. This is actually improved significantly with transfusion.  [facility], Dashanna Kinnamon 02/22/2016

## 2016-02-23 ENCOUNTER — Inpatient Hospital Stay (HOSPITAL_COMMUNITY)
Admit: 2016-02-23 | Discharge: 2016-02-23 | Disposition: A | Discharge status: Home / Self Care | Payer: Medicare Other | Attending: Internal Medicine | Admitting: Internal Medicine

## 2016-02-23 ENCOUNTER — Inpatient Hospital Stay (HOSPITAL_COMMUNITY): Payer: Medicare Other

## 2016-02-23 ENCOUNTER — Encounter (HOSPITAL_COMMUNITY): Payer: Self-pay | Admitting: Urology

## 2016-02-23 ENCOUNTER — Ambulatory Visit: Payer: Medicare Other

## 2016-02-23 ENCOUNTER — Other Ambulatory Visit: Payer: Medicare Other

## 2016-02-23 DIAGNOSIS — M7989 Other specified soft tissue disorders: Secondary | ICD-10-CM

## 2016-02-23 DIAGNOSIS — R569 Unspecified convulsions: Secondary | ICD-10-CM

## 2016-02-23 DIAGNOSIS — L8990 Pressure ulcer of unspecified site, unspecified stage: Secondary | ICD-10-CM | POA: Insufficient documentation

## 2016-02-23 LAB — BASIC METABOLIC PANEL
Anion gap: 8 (ref 5–15)
BUN: 29 mg/dL — ABNORMAL HIGH (ref 6–20)
CALCIUM: 7.7 mg/dL — AB (ref 8.9–10.3)
CO2: 21 mmol/L — ABNORMAL LOW (ref 22–32)
CREATININE: 2 mg/dL — AB (ref 0.61–1.24)
Chloride: 113 mmol/L — ABNORMAL HIGH (ref 101–111)
GFR, EST AFRICAN AMERICAN: 35 mL/min — AB (ref >60)
GFR, EST NON AFRICAN AMERICAN: 30 mL/min — AB (ref >60)
Glucose, Bld: 115 mg/dL — ABNORMAL HIGH (ref 65–99)
Potassium: 3.2 mmol/L — ABNORMAL LOW (ref 3.5–5.1)
SODIUM: 142 mmol/L (ref 135–145)

## 2016-02-23 LAB — GLUCOSE, CAPILLARY
Glucose-Capillary: 114 mg/dL — ABNORMAL HIGH (ref 65–99)
Glucose-Capillary: 142 mg/dL — ABNORMAL HIGH (ref 65–99)
Glucose-Capillary: 135 mg/dL — ABNORMAL HIGH (ref 65–99)
Glucose-Capillary: 188 mg/dL — ABNORMAL HIGH (ref 65–99)

## 2016-02-23 LAB — CBC
HCT: 25.1 % — ABNORMAL LOW (ref 39.0–52.0)
Hemoglobin: 8.2 g/dL — ABNORMAL LOW (ref 13.0–17.0)
MCH: 31.3 pg (ref 26.0–34.0)
MCHC: 32.7 g/dL (ref 30.0–36.0)
MCV: 95.8 fL (ref 78.0–100.0)
PLATELETS: 107 10*3/uL — AB (ref 150–400)
RBC: 2.62 MIL/uL — ABNORMAL LOW (ref 4.22–5.81)
RDW: 24.7 % — AB (ref 11.5–15.5)
WBC: 9.8 10*3/uL (ref 4.0–10.5)

## 2016-02-23 LAB — HEMOGLOBIN A1C
HEMOGLOBIN A1C: 4.9 % (ref 4.8–5.6)
MEAN PLASMA GLUCOSE: 94 mg/dL

## 2016-02-23 MED ORDER — COLCHICINE 0.6 MG PO TABS
0.3000 mg | ORAL_TABLET | Freq: Every day | ORAL | Status: DC
Start: 2016-02-24 — End: 2016-02-24
  Administered 2016-02-24: 0.3 mg via ORAL
  Filled 2016-02-23 (×2): qty 1

## 2016-02-23 MED ORDER — COLCHICINE 0.6 MG PO TABS: 0.3000 mg | ORAL_TABLET | Freq: Every day | ORAL | Status: DC

## 2016-02-23 MED ORDER — COLCHICINE 0.6 MG PO TABS
0.6000 mg | ORAL_TABLET | Freq: Once | ORAL | Status: AC
  Administered 2016-02-23: 0.6 mg via ORAL
  Filled 2016-02-23: qty 1

## 2016-02-23 MED ORDER — POTASSIUM CHLORIDE CRYS ER 20 MEQ PO TBCR
20.0000 meq | EXTENDED_RELEASE_TABLET | Freq: Two times a day (BID) | ORAL | Status: DC
  Administered 2016-02-23 – 2016-02-24 (×3): 20 meq via ORAL
  Filled 2016-02-23 (×3): qty 1

## 2016-02-23 NOTE — Progress Notes (Signed)
Progress Note    [name]  [id number] DOB: December 16, 1935  DOA: 02/20/2016 PCP: [name], MD    Brief Narrative:   [name] Alani. is an 80 y.o. male with a PMH of chronic diastolic CHF, diabetes, hypertension, stage IV CK D, myelodysplastic syndrome and PAF who was admitted 02/20/16 with new onset seizures. Upon initial evaluation in the ED, he was found to be profoundly anemic with a hemoglobin of 3.4 in the setting of chronic hematuria. He was transfused 4 units of PRBCs and urologist/neurologist consulted for assistance with management.  Assessment/Plan:   Principal Problems:   Seizure ([place]) CT of the brain was performed and revealed no evidence of hemorrhage, or mass lesion, or prior ischemic change. There was evidence of a mild to moderate diffuse atrophy.Evaluated by neurologist with recommendations to proceed with seizure precautions, EEG, speech evaluation, and frequent neurologic checks. No antiepileptic medications were initiated given that this was his first seizure.    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 He has received a total of 4 units PRBCs. Status post cystoscopy with clot evacuation / fulgeration 02/22/16. Hemoglobin stable at 8.2.  Active Problems:   Left upper extremity swelling Doppler done to rule out DVT. Negative. Add low dose colchicine. Possibly related to gout.    PAF (paroxysmal atrial fibrillation) (HCC)/sinus bradycardia/status post pacemaker extraction secondary to history of MRSA bacteremia/elevated troponin consistent with demand ischemia Clearly not a candidate for blood thinners. Due to bradycardia, clonidine  was discontinued and his dose of Coreg was decreased. Continue amiodarone. History of recent pacemaker extraction secondary to MRSA bacteremia. He completed his course of antibiotic therapy on 02/12/16. Mild elevation of troponins noted, trend is flat. These findings are most consistent with demand ischemia.    Chronic diastolic heart failure (HCC) Monitor I/O and daily weights. Weight currently stable. Continue Lasix.    Diabetes mellitus ([place]) Diet controlled at baseline. Currently being managed with insulin sensitive SSI.    Essential hypertension Continue Coreg, Lasix, Imdur and hydralazine. Blood pressure running high.    CKD (chronic kidney disease), stage IV (HCC) Baseline creatinine appears to be around 3-4. Current creatinine is 2.05. Suspect he had some chronic obstruction, possibly from blood clots. Creatinine now improved.    MDS (myelodysplastic syndrome) (HCC) with chronic pancytopenia/anemia/leukopenia/thrombocytopenia   Thrombocytopenia (HCC) Patient is followed by [name]. Receives Aranesp every 2 weeks at the cancer center.    Hyperlipidemia Continue Lipitor.   Family Communication/Anticipated D/C date and plan/Code Status   DVT prophylaxis: Lovenox ordered. Code Status: DO NOT RESUSCITATE.  Family Communication: Multiple family updated at the bedside. Disposition Plan: Hopefully home tomorrow.   Medical Consultants:    Urology   Procedures:    Cystoscopy, clot evacuation, fulguration of bleeders 02/22/16  Anti-Infectives:   None.  Subjective:   [name] Marble Sr. is More awake and alert today. He reports left arm and left knee pain. No nausea, vomiting, dyspnea. Bowels moved yesterday. Foley draining clear yellow urine.  Objective:    Vitals:   02/22/16 1817 02/22/16 2239 02/23/16 0500 02/23/16 0512  BP: (!) 169/80 (!) 142/61  (!) 166/72  Pulse:  65  80  Resp:  16  14  Temp:  98.7 F (37.1 C) 98.8 F (37.1 C) 98.3 F (36.8 C)    TempSrc:  Oral Oral Oral  SpO2: 100% 99%  98%  Weight:   85 kg (187 lb 6.3 oz)   Height:        Intake/Output Summary (Last 24 hours) at 02/23/16 0931 Last data filed at 02/23/16 0515  Gross per 24 hour  Intake             1450 ml  Output             1450 ml  Net                0 ml   Filed Weights   02/21/16 2243 02/22/16 0646 02/23/16 0500  Weight: 83.9 kg (184 lb 15.5 oz) 83.8 kg (184 lb 11.9 oz) 85 kg (187 lb 6.3 oz)    Exam: General exam: Appears calm and comfortable.  Respiratory system: Clear to auscultation. Respiratory effort normal. Cardiovascular system: S1 & S2 heard, RRR. No JVD,  rubs, gallops or clicks. No murmurs. Gastrointestinal system: Abdomen is nondistended, soft and nontender. No organomegaly or masses felt. Normal bowel sounds heard. Central nervous system: Alert and oriented. No focal neurological deficits. Extremities: Left upper extremity swelling and pain. Skin: No rashes, lesions or ulcers. Psychiatry: Judgement and insight appear diminished. Mood & affect flat.   Data Reviewed:   I have personally reviewed following labs and imaging studies:  Labs: Basic Metabolic Panel:  Recent Labs Lab 02/20/16 1430 02/21/16 0627 02/23/16 0500  NA 141 144 142  K 4.4 3.8 3.2*  CL 110 110 113*  CO2 21* 23 21*  GLUCOSE 228* 113* 115*  BUN 35* 32* 29*  CREATININE 2.27* 2.05* 2.00*  CALCIUM 7.7* 7.7* 7.7*   GFR Estimated Creatinine Clearance: 31.4 mL/min (by C-G formula based on SCr of 2 mg/dL). Liver Function Tests:  Recent Labs Lab 02/20/16 1430 02/21/16 0627  AST 27 21  ALT 6* <5*  ALKPHOS 44 37*  BILITOT 0.2* 0.7  PROT 5.4* 5.1*  ALBUMIN 2.2* 2.1*   Coagulation profile  Recent Labs Lab 02/20/16 1430 02/21/16 0627  INR 1.28 1.30    CBC:  Recent Labs Lab 02/20/16 1645 02/20/16 2339 02/21/16 0627 02/21/16 1820 02/22/16 0850 02/23/16 0500  WBC 7.4 7.1 6.8  --  8.7 9.8  NEUTROABS 5.9  --   --   --   --   --   HGB 3.4* 4.6*  6.2* 8.2* 8.2* 8.2*  HCT 12.2* 14.8* 19.4* 25.4* 24.6* 25.1*  MCV 113.0* 105.0* 94.2  --  93.5 95.8  PLT 113* 106* 86*  --  87* 107*   Cardiac Enzymes:  Recent Labs Lab 02/20/16 1816 02/21/16 0627  TROPONINI 0.04* 0.05*   CBG:  Recent Labs Lab 02/22/16 0736 02/22/16 1159 02/22/16 1748 02/22/16 2236 02/23/16 0735  GLUCAP 120* 116* 123* 137* 114*   Sepsis Labs:  Recent Labs Lab 02/20/16 1847 02/20/16 2339 02/21/16 0627 02/22/16 0850 02/23/16 0500  WBC  --  7.1 6.8 8.7 9.8  LATICACIDVEN 2.3* 1.5  --   --   --     Microbiology Recent Results (from the past 240 hour(s))  MRSA PCR Screening     Status: None   Collection Time: 02/20/16 10:43 PM  Result Value Ref Range Status   MRSA by PCR NEGATIVE NEGATIVE Final    Comment:        The GeneXpert MRSA Assay (FDA approved for NASAL specimens  only), is one component of a comprehensive MRSA colonization surveillance program. It is not intended to diagnose MRSA infection nor to guide or monitor treatment for MRSA infections.   Culture, blood (Routine X 2) w Reflex to ID Panel     Status: None (Preliminary result)   Collection Time: 02/20/16 11:28 PM  Result Value Ref Range Status   Specimen Description BLOOD RIGHT HAND  Final   Special Requests IN PEDIATRIC BOTTLE 0.5CC  Final   Culture NO GROWTH 1 DAY  Final   Report Status PENDING  Incomplete  Culture, blood (Routine X 2) w Reflex to ID Panel     Status: None (Preliminary result)   Collection Time: 02/20/16 11:35 PM  Result Value Ref Range Status   Specimen Description BLOOD RIGHT HAND  Final   Special Requests BOTTLES DRAWN AEROBIC ONLY [place]  Final   Culture NO GROWTH 1 DAY  Final   Report Status PENDING  Incomplete    Radiology: No results found.  Medications:   . sodium chloride   Intravenous Once  . amiodarone  100 mg Oral Daily  . atorvastatin  80 mg Oral QHS  . Calcifediol ER  30 mcg Oral QHS  . carvedilol  6.25 mg Oral BID WC  . furosemide   80 mg Oral Daily  . hydrALAZINE  75 mg Oral QID  . insulin aspart  0-9 Units Subcutaneous TID WC  . isosorbide mononitrate  120 mg Oral Daily  . pantoprazole  40 mg Oral Daily  . sodium bicarbonate  650 mg Oral BID  . sodium chloride flush  10-40 mL Intracatheter Q12H  . sodium chloride flush  3 mL Intravenous Q12H  . tamsulosin  0.4 mg Oral QPC supper   Continuous Infusions:   Time spent: 25 minutes.   [facility] [place] Pager [phone]. If unable to reach me by pager, please call my cell phone at [phone].  *Please refer to [email or web address], password [email or web address] to get updated schedule on who will round on this patient, as hospitalists switch teams weekly. If 7PM-7AM, please contact night-coverage at [email or web address], password [email or web address] for any overnight needs.  02/23/2016, 9:31 AM

## 2016-02-23 NOTE — Procedures (Signed)
HPI:  80 y/o with new onset seizure  TECHNICAL SUMMARY:  A multichannel referential and bipolar montage EEG using the standard international 10-20 system was performed on the patient described as awake and drowsy.  The dominant background activity consists of 9-10 hertz activity seen most prominantly over the posterior head region.  The backgound activity is minimally reactive to eye opening and closing procedures.  Low voltage fast (beta) activity is distributed symmetrically and maximally over the anterior head regions.  ACTIVATION:  Stepwise photic stimulation and hyperventilation are not performed.  EPILEPTIFORM ACTIVITY:  There were no spikes, sharp waves or paroxysmal activity.  SLEEP:  Brief stage 1 sleep is noted but no stage 2 sleep  CARDIAC:  The EKG lead revealed a regular sinus rhythm.  IMPRESSION:  This is a normal EEG for the patients stated age.  There were no focal, hemispheric or lateralizing features.  No epileptiform activity was recorded.  A normal EEG does not exclude the diagnosis of a seizure disorder and if seizure remains high on the list of differential diagnosis, an ambulatory EEG may be of value.  Clinical correlation is required.

## 2016-02-23 NOTE — Progress Notes (Signed)
1 Day Post-Op Subjective: Patient reports no pelvic pain. Urine clear off CBI.   Objective: Vital signs in last 24 hours: Temp:  [98.3 F (36.8 C)-98.8 F (37.1 C)] 98.3 F (36.8 C) (08/14 0512) Pulse Rate:  [65-80] 76 (08/14 1403) Resp:  [14-16] 14 (08/14 0512) BP: (142-169)/(61-80) 166/68 (08/14 1403) SpO2:  [98 %-100 %] 98 % (08/14 0512) Weight:  [85 kg (187 lb 6.3 oz)] 85 kg (187 lb 6.3 oz) (08/14 0500)  Intake/Output from previous day: 08/13 0701 - 08/14 0700 In: 4450 [I.V.:900; IV Piggyback:550] Out: 2700 [Urine:2600; Blood:100] Intake/Output this shift: Total I/O In: 10 [I.V.:10] Out: -   Physical Exam:  General:alert, cooperative and appears stated age GI: soft, non tender, normal bowel sounds, no palpable masses, no organomegaly, no inguinal hernia Male genitalia: not done Resp: clear to auscultation bilaterally Extremities: extremities normal, atraumatic, no cyanosis or edema  Lab Results:  Recent Labs  02/21/16 1820 02/22/16 0850 02/23/16 0500  HGB 8.2* 8.2* 8.2*  HCT 25.4* 24.6* 25.1*   BMET  Recent Labs  02/21/16 0627 02/23/16 0500  NA 144 142  K 3.8 3.2*  CL 110 113*  CO2 23 21*  GLUCOSE 113* 115*  BUN 32* 29*  CREATININE 2.05* 2.00*  CALCIUM 7.7* 7.7*    Recent Labs  02/21/16 0627  INR 1.30   No results for input(s): LABURIN in the last 72 hours. Results for orders placed or performed during the hospital encounter of 02/20/16  MRSA PCR Screening     Status: None   Collection Time: 02/20/16 10:43 PM  Result Value Ref Range Status   MRSA by PCR NEGATIVE NEGATIVE Final    Comment:        The GeneXpert MRSA Assay (FDA approved for NASAL specimens only), is one component of a comprehensive MRSA colonization surveillance program. It is not intended to diagnose MRSA infection nor to guide or monitor treatment for MRSA infections.   Culture, blood (Routine X 2) w Reflex to ID Panel     Status: None (Preliminary result)   Collection Time: 02/20/16 11:28 PM  Result Value Ref Range Status   Specimen Description BLOOD RIGHT HAND  Final   Special Requests IN PEDIATRIC BOTTLE 0.5CC  Final   Culture NO GROWTH 2 DAYS  Final   Report Status PENDING  Incomplete  Culture, blood (Routine X 2) w Reflex to ID Panel     Status: None (Preliminary result)   Collection Time: 02/20/16 11:35 PM  Result Value Ref Range Status   Specimen Description BLOOD RIGHT HAND  Final   Special Requests BOTTLES DRAWN AEROBIC ONLY 8CC  Final   Culture NO GROWTH 2 DAYS  Final   Report Status PENDING  Incomplete    Studies/Results: No results found.  Assessment/Plan: 80yo with hemorrhagic cystitis and clot retention s/p cystoscopy clot evacuation, and fulgeration  Plan: 1. Continue foley catheter to straight drain 2. Patient should have his foley catheter changed monthly. 3. The patient needs to followup in 2-3 weeks after discharge with [facility]   LOS: 2 days   Nicolette Bang 02/23/2016, 5:14 PM

## 2016-02-23 NOTE — Evaluation (Addendum)
Occupational Therapy Evaluation Patient Details Name: [name] KUPER Sr. MRN: [id number] DOB: 13-Jul-1935 Today's Date: 02/23/2016    History of Present Illness 80 yo male admitted with seizure on 02/20/16, LLE pain and left hand/wrist pain with noticable edema of hand. PMHx of: hematuria, L wrist and ankle pain with gout history, PAF. 7/12 I&D Lt wrist with pseudogout diagnosed; 7/20 pacemaker removed and Rt groin/femoral artery opened to retrieve pacemaker lead. 02/22/16 pt underwent Cystoscopy, clot evacuation, fulguration of bleeders   Clinical Impression   This 80 yo male admitted with above presents to acute OT with deficits below (see OT problem list) thus affecting pt being able to complete his basic ADLs more on his on (like he as prior to hospital admission in July of this year). He will continue to benefit from acute OT with follow up at SNF.    Follow Up Recommendations  SNF    Equipment Recommendations   (TBD next venue)       Precautions / Restrictions Precautions Precautions: Fall Precaution Comments: swollen left wrist hand and painful LLE Other Brace/Splint: had a wrist splint last admission, but no more Restrictions Weight Bearing Restrictions: No Other Position/Activity Restrictions: based on ortho PN last admission      Mobility Bed Mobility Overal bed mobility: Needs Assistance Bed Mobility: Supine to Sit     Supine to sit: Mod assist        Transfers Overall transfer level: Needs assistance Equipment used: 2 person hand held assist Transfers: Sit to/from Stand;Stand Pivot Transfers Sit to Stand: Min assist;+2 physical assistance Stand pivot transfers: Min assist;+2 physical assistance            Balance Overall balance assessment: Needs assistance Sitting-balance support: No upper extremity supported;Feet supported Sitting balance-Leahy Scale: Fair     Standing balance support: Single extremity supported Standing balance-Leahy Scale:  Poor Standing balance comment: Relies on support of RUE, not really supporting with LUE due to swelling and pain with WB'ing                            ADL Overall ADL's : Needs assistance/impaired Eating/Feeding: Set up;Sitting   Grooming: Moderate assistance;Sitting   Upper Body Bathing: Minimal assitance;Sitting   Lower Body Bathing: Maximal assistance (with +2 min A sit<>stand from elevated surface)   Upper Body Dressing : Moderate assistance;Sitting   Lower Body Dressing: Total assistance (with Min A +2 sit<>stand from elevated surface)   Toilet Transfer: Moderate assistance;+2 for physical assistance;Stand-pivot   Toileting- Clothing Manipulation and Hygiene: Total assistance (min A +2 sit<>stand)         General ADL Comments: Per pt report at SNF they had been doing all of his basic ADLs with set up for feeding.               Pertinent Vitals/Pain Pain Assessment: 0-10 Pain Score: 7  Pain Location: LLE, not really complaining of left hand or wrist pain except with increase PROM and WB'ing Pain Descriptors / Indicators: Tightness;Sore;Guarding;Grimacing Pain Intervention(s): Limited activity within patient's tolerance;Monitored during session;Repositioned (per pt he had pain meds earlier this AM)     Hand Dominance Right   Extremity/Trunk Assessment Upper Extremity Assessment Upper Extremity Assessment: LUE deficits/detail LUE Deficits / Details: at rest no pain, with his limited AROM no pain, with PROM all joints increased pain LUE Coordination: decreased fine motor (also decreased GM of wrist and forearm)  Pt reports that he has been  trying to use his other hand to help move his fingers on LUE--encouraged him to keep working on this.           Communication Communication Communication: No difficulties   Cognition Arousal/Alertness: Awake/alert Behavior During Therapy: WFL for tasks assessed/performed Overall Cognitive Status: Within  Functional Limits for tasks assessed                                Home Living Family/patient expects to be discharged to:: Skilled nursing facility (has been there about 2 weeks per pt for rehab after previous admission to [facility])                                             OT Diagnosis: Generalized weakness;Acute pain   OT Problem List: Decreased strength;Decreased range of motion;Decreased activity tolerance;Decreased coordination;Impaired UE functional use;Impaired balance (sitting and/or standing);Pain;Increased edema   OT Treatment/Interventions: Self-care/ADL training;Therapeutic activities;Therapeutic exercise;Splinting;Patient/family education;Balance training    OT Goals(Current goals can be found in the care plan section) Acute Rehab OT Goals Patient Stated Goal: to be able to get up and walk again OT Goal Formulation: With patient  OT Frequency: Min 2X/week   Barriers to D/C: Decreased caregiver support             End of Session Equipment Utilized During Treatment: Gait belt Nurse Communication: Mobility status (RN assisted with getting pt up on his feet and turning to recliner on his right). Pt reports he did not follow up with Ortho MD that did the I& D of his left wrist--made RN aware to ask attending MD this admission about follow up while pt in hospital. Also made RN aware that pt did not have a PT consult and could really benefit from one.  Activity Tolerance: Patient limited by pain Patient left: in chair;with call bell/phone within reach;with chair alarm set   Time: [id number] OT Time Calculation (min): 24 min Charges:  OT General Charges $OT Visit: 1 Procedure OT Evaluation $OT Eval Low Complexity: 1 Procedure OT Treatments $Self Care/Home Management : 8-22 mins  Almon Register [id number] 02/23/2016, 9:14 AM

## 2016-02-23 NOTE — Progress Notes (Signed)
EEG completed, results pending. 

## 2016-02-23 NOTE — Progress Notes (Signed)
*  PRELIMINARY RESULTS* Vascular Ultrasound Left upper extremity venous duplex has been completed.  Preliminary findings: No evidence of DVT or superficial thrombosis.  Landry Mellow, RDMS, RVT  02/23/2016, 9:14 AM

## 2016-02-23 NOTE — Progress Notes (Signed)
Occupational Therapy Treatment Patient Details Name: [name] BAZINET Sr. MRN: [id number] DOB: 06-13-1936 Today's Date: 02/23/2016    History of present illness 80 yo male admitted with seizure on 02/20/16, LLE pain and left hand/wrist pain with noticable edema of hand. PMHx of: hematuria, L wrist and ankle pain with gout history, PAF. 7/12 I&D Lt wrist with pseudogout diagnosed; 7/20 pacemaker removed and Rt groin/femoral artery opened to retrieve pacemaker lead. 02/22/16 pt underwent Cystoscopy, clot evacuation, fulguration of bleeders   OT comments  This 15 male seen today for a second time due to needed to get back in bed for LUE doppler. No progress seen from 1st to 2nd session (pt really trying to do what was asked of him).  Follow Up Recommendations  SNF    Equipment Recommendations  Other (comment) (TBD next venue)       Precautions / Restrictions Precautions Precautions: Fall Precaution Comments: swollen left wrist hand and painful LLE Other Brace/Splint: had a wrist splint last admission, but no more Restrictions Weight Bearing Restrictions: No LUE Weight Bearing: Weight bearing as tolerated Other Position/Activity Restrictions: based on ortho PN last admission       Mobility Bed Mobility Overal bed mobility: Needs Assistance Bed Mobility: Sit to Supine     Sit to supine: Mod assist;+2 for physical assistance (HOB flat (one for trunk and one for legs))    Transfers Overall transfer level: Needs assistance Equipment used: 2 person hand held assist Transfers: Sit to/from Stand Sit to Stand: Mod assist;+2 physical assistance (from lower recliner) Stand pivot transfers: Mod assist;+2 physical assistance (to get back to bed after being up in recliner)              ADL Overall ADL's : Needs assistance/impaired                                                        Cognition   Behavior During Therapy: WFL for tasks  assessed/performed Overall Cognitive Status: Within Functional Limits for tasks assessed                       Extremity/Trunk Assessment  Upper Extremity Assessment Upper Extremity Assessment: Defer to OT evaluation                  Pertinent Vitals/ Pain       Pain Assessment: 0-10 Pain Score: 7  Pain Location: left knee and left arm with WB'ing and movement Pain Descriptors / Indicators: Guarding;Grimacing;Sore Pain Intervention(s): Monitored during session;Repositioned  Home Living Family/patient expects to be discharged to:: Skilled nursing facility                                        Prior Functioning/Environment          Comments: Was independent with mobility prior to hospitalization in early July. Has been at rehab the last 2 weeks at [facility].   Frequency Min 2X/week     Progress Toward Goals  OT Goals(current goals can now be found in the care plan section)  Progress towards OT goals: Not progressing toward goals - comment (pt did not do as well back into bed (had to get up from  lower surface and then had increased difficulty turning to get to bed even though going to the side without pain))  Acute Rehab OT Goals Patient Stated Goal: to be able to get up and walk again  Plan Discharge plan remains appropriate       End of Session Equipment Utilized During Treatment: Gait belt   Activity Tolerance Patient limited by pain   Patient Left in bed;with call bell/phone within reach (doppler technician in room)   Nurse Communication  (RN A'd me with pt into bed)        Time: [id number] OT Time Calculation (min): 8 min  Charges: OT General Charges $OT Visit: 1 Procedure OT Treatments $Therapeutic Activity: 8-22 mins  Almon Register [id number] 02/23/2016, 3:43 PM

## 2016-02-23 NOTE — Progress Notes (Addendum)
Subjective: No further seizures over night. patient was brought in due to a episode of TC like activity with 2-3 minutes of possible confusion at the  Nursing home. While in hospital he has remained afebrile, BP 140-181/ 61-98.   Exam: Vitals:   02/23/16 0500 02/23/16 0512  BP:  (!) 166/72  Pulse:  80  Resp:  14  Temp: 98.8 F (37.1 C) 98.3 F (36.8 C)    HEENT-  Normocephalic, no lesions, without obvious abnormality.  Normal external eye and conjunctiva.  Normal TM's bilaterally.  Normal auditory canals and external ears. Normal external nose, mucus membranes and septum.  Normal pharynx. Cardiovascular- S1, S2 normal, pulses palpable throughout   Lungs- chest clear, no wheezing, rales, normal symmetric air entry, Heart exam - S1, S2 normal, no murmur, no gallop, rate regular Abdomen- normal findings: bowel sounds normal Extremities- no edema Lymph-no adenopathy palpable Musculoskeletal-joint tenderness on left hand and left knee.  Skin-left hand has shinny skin and pain suspect RDS after surgery    Gen: In bed, NAD MS: alert and oriented CN: PERRLA, EOMI, TML, FACE symmetric, sensation intact Motor: able to move right UE antigravity with good strength. Left hand is very painful to touch and skin is very glossy. Patient has a hard time moving this hand due to pain. Able to lif left arm off bed. Unable to lift bilateral legs off bed at hip flexors. Bilateral ADudction and ABduction of hip 5/5.DF/PF 5/5. Significant pain on left knee.  Sensory: intact throughout DTR: 1+ ub UE and minimal KJ or AJ  Pertinent Labs/Diagnostics: K 3.2 CL 113 BUN 29 Cr 2.00 Hg 8.2 HCT 25   [name] Quill PA-C [facility] [phone]  Impression: 80 YO male with episode of Shaking following standing up in the setting of severe anemia. Though possible seizure, if it is seizure I suspect that this is precipitated due to hypoperfusion from his severe anemia and would consider to provoke  seizure. Also possible would be that it was myoclonic syncope. Given that this is a single event, I would not favor starting any antiepileptic therapy at this time especially given that I suspect it is provoked.   Recommendations: 1) EEG(available at the time of finalizing this note and is negative) 2) no further recommendations at this time, please call with any further questions or concerns.   [name] Rack, MD [facility] [phone]  If 7pm- 7am, please page neurology on call as listed in [place].  02/23/2016, 11:01 AM

## 2016-02-23 NOTE — Evaluation (Signed)
Physical Therapy Evaluation Patient Details Name: [name]. MRN: [id number] DOB: 02-09-1936 Today's Date: 02/23/2016   History of Present Illness  80 yo male admitted with seizure on 02/20/16, LLE pain and left hand/wrist pain with noticable edema of hand. PMHx of: hematuria, L wrist and ankle pain with gout history, PAF. 7/12 I&D Lt wrist with pseudogout diagnosed; 7/20 pacemaker removed and Rt groin/femoral artery opened to retrieve pacemaker lead. 02/22/16 pt underwent Cystoscopy, clot evacuation, fulguration of bleeders  Clinical Impression  Pt admitted with above diagnosis. Pt currently with functional limitations due to the deficits listed below (see PT Problem List).  Pt will benefit from skilled PT to increase their independence and safety with mobility to allow discharge to the venue listed below.  Pt limited at eval by L wrist and L LE pain.  Used EVA walker with increased WB through elbows and pt liked this approach.  Recommend returning to SNF for continued rehab.     Follow Up Recommendations SNF    Equipment Recommendations  None recommended by PT    Recommendations for Other Services       Precautions / Restrictions Precautions Precautions: Fall Precaution Comments: swollen left wrist hand and painful LLE Other Brace/Splint: had a wrist splint last admission, but no more Restrictions Weight Bearing Restrictions: No LUE Weight Bearing: Weight bearing as tolerated Other Position/Activity Restrictions: based on ortho PN last admission      Mobility  Bed Mobility   Bed Mobility: Supine to Sit     Supine to sit: Mod assist     General bed mobility comments: A for L LE and cues for technique  Transfers Overall transfer level: Needs assistance Equipment used: Bilateral platform walker (EVA) Transfers: Sit to/from Stand Sit to Stand: Min assist;+2 physical assistance Stand pivot transfers: Min assist;+2 physical assistance       General transfer  comment: SPT with EVA walker to recliner for late lunch. He was able to advance LE without A. Needed A with stand to sit.  Ambulation/Gait             General Gait Details: SPT   Stairs            Wheelchair Mobility    Modified Rankin (Stroke Patients Only)       Balance Overall balance assessment: Needs assistance Sitting-balance support: Feet supported Sitting balance-Leahy Scale: Fair     Standing balance support: Bilateral upper extremity supported Standing balance-Leahy Scale: Poor Standing balance comment: B UE support on EVA RW through elbows.                             Pertinent Vitals/Pain Pain Assessment: 0-10 Pain Score: 6  Pain Location: L arm and L knee Pain Intervention(s): RN gave pain meds during session    [place] expects to be discharged to:: Skilled nursing facility                      Prior Function           Comments: Was independent with mobility prior to hospitalization in early July. Has been at rehab the last 2 weeks at [facility].     Hand Dominance   Dominant Hand: Right    Extremity/Trunk Assessment   Upper Extremity Assessment: Defer to OT evaluation           Lower Extremity Assessment: LLE deficits/detail   LLE Deficits /  Details: Limited L knee ROM   Cervical / Trunk Assessment: Normal  Communication   Communication: No difficulties  Cognition Arousal/Alertness: Awake/alert Behavior During Therapy: WFL for tasks assessed/performed Overall Cognitive Status: Within Functional Limits for tasks assessed                      General Comments      Exercises        Assessment/Plan    PT Assessment Patient needs continued PT services  PT Diagnosis Difficulty walking;Generalized weakness;Acute pain   PT Problem List Decreased strength;Decreased range of motion;Decreased activity tolerance;Decreased balance;Decreased mobility;Pain;Decreased  coordination  PT Treatment Interventions DME instruction;Gait training;Functional mobility training;Therapeutic activities;Therapeutic exercise;Balance training;Patient/family education   PT Goals (Current goals can be found in the Care Plan section) Acute Rehab PT Goals Patient Stated Goal: to be able to get up and walk again PT Goal Formulation: With patient Time For Goal Achievement: 03/08/16 Potential to Achieve Goals: Good    Frequency Min 3X/week   Barriers to discharge        Co-evaluation               End of Session Equipment Utilized During Treatment: Gait belt Activity Tolerance: Patient limited by fatigue;Patient limited by pain Patient left: in chair;with call bell/phone within reach;with family/visitor present Nurse Communication: Mobility status         Time: 1355 (no charge for time getting equipment)-1434 PT Time Calculation (min) (ACUTE ONLY): 39 min   Charges:   PT Evaluation $PT Eval Moderate Complexity: 1 Procedure PT Treatments $Therapeutic Activity: 8-22 mins   PT G Codes:        [name] 02/23/2016, 3:06 PM

## 2016-02-24 ENCOUNTER — Other Ambulatory Visit: Payer: Self-pay | Admitting: Hematology and Oncology

## 2016-02-24 DIAGNOSIS — L8990 Pressure ulcer of unspecified site, unspecified stage: Secondary | ICD-10-CM

## 2016-02-24 DIAGNOSIS — D469 Myelodysplastic syndrome, unspecified: Secondary | ICD-10-CM

## 2016-02-24 LAB — TYPE AND SCREEN
ABO/RH(D): O POS
ANTIBODY SCREEN: NEGATIVE
UNIT DIVISION: 0
UNIT DIVISION: 0
UNIT DIVISION: 0
Unit division: 0
Unit division: 0

## 2016-02-24 LAB — CBC
HCT: 23.2 % — ABNORMAL LOW (ref 39.0–52.0)
Hemoglobin: 7.6 g/dL — ABNORMAL LOW (ref 13.0–17.0)
MCH: 31.8 pg (ref 26.0–34.0)
MCHC: 32.8 g/dL (ref 30.0–36.0)
MCV: 97.1 fL (ref 78.0–100.0)
PLATELETS: 100 10*3/uL — AB (ref 150–400)
RBC: 2.39 MIL/uL — AB (ref 4.22–5.81)
RDW: 24.8 % — AB (ref 11.5–15.5)
WBC: 8.7 10*3/uL (ref 4.0–10.5)

## 2016-02-24 LAB — BASIC METABOLIC PANEL
Anion gap: 6 (ref 5–15)
BUN: 31 mg/dL — AB (ref 6–20)
CALCIUM: 7.7 mg/dL — AB (ref 8.9–10.3)
CHLORIDE: 110 mmol/L (ref 101–111)
CO2: 22 mmol/L (ref 22–32)
CREATININE: 1.96 mg/dL — AB (ref 0.61–1.24)
GFR calc non Af Amer: 31 mL/min — ABNORMAL LOW (ref >60)
GFR, EST AFRICAN AMERICAN: 36 mL/min — AB (ref >60)
GLUCOSE: 115 mg/dL — AB (ref 65–99)
Potassium: 3.4 mmol/L — ABNORMAL LOW (ref 3.5–5.1)
Sodium: 138 mmol/L (ref 135–145)

## 2016-02-24 LAB — PREPARE RBC (CROSSMATCH)

## 2016-02-24 LAB — GLUCOSE, CAPILLARY
Glucose-Capillary: 114 mg/dL — ABNORMAL HIGH (ref 65–99)
Glucose-Capillary: 195 mg/dL — ABNORMAL HIGH (ref 65–99)

## 2016-02-24 LAB — URIC ACID: URIC ACID, SERUM: 6.8 mg/dL (ref 4.4–7.6)

## 2016-02-24 MED ORDER — POTASSIUM CHLORIDE ER 10 MEQ PO TBCR: 10.0000 meq | EXTENDED_RELEASE_TABLET | Freq: Every day | ORAL | Status: DC

## 2016-02-24 MED ORDER — FERROUS SULFATE 325 (65 FE) MG PO TABS
325.0000 mg | ORAL_TABLET | Freq: Three times a day (TID) | ORAL | Status: DC
  Administered 2016-02-24 (×2): 325 mg via ORAL
  Filled 2016-02-24 (×2): qty 1

## 2016-02-24 MED ORDER — HEPARIN SOD (PORK) LOCK FLUSH 100 UNIT/ML IV SOLN
250.0000 [IU] | INTRAVENOUS | Status: AC | PRN
  Administered 2016-02-24: 250 [IU]

## 2016-02-24 MED ORDER — FERROUS SULFATE 325 (65 FE) MG PO TABS: 325.0000 mg | ORAL_TABLET | Freq: Three times a day (TID) | ORAL | 3 refills | Status: DC

## 2016-02-24 MED ORDER — FUROSEMIDE 80 MG PO TABS: 80.0000 mg | ORAL_TABLET | Freq: Every day | ORAL | Status: DC

## 2016-02-24 MED ORDER — HYDRALAZINE HCL 25 MG PO TABS: 75.0000 mg | ORAL_TABLET | Freq: Four times a day (QID) | ORAL | Status: DC

## 2016-02-24 MED ORDER — CARVEDILOL 6.25 MG PO TABS
6.2500 mg | ORAL_TABLET | Freq: Two times a day (BID) | ORAL | Status: DC
Start: 2016-02-24 — End: 2016-03-31

## 2016-02-24 MED ORDER — SODIUM CHLORIDE 0.9 % IV SOLN
Freq: Once | INTRAVENOUS | Status: AC
  Administered 2016-02-24: 16:00:00 via INTRAVENOUS

## 2016-02-24 MED ORDER — COLCHICINE 0.6 MG PO TABS
0.3000 mg | ORAL_TABLET | Freq: Every day | ORAL | Status: DC
Start: 2016-02-24 — End: 2016-03-31

## 2016-02-24 NOTE — Discharge Summary (Signed)
Physician Discharge Summary  [name] [id number] DOB: 1936-04-09 DOA: 02/20/2016  PCP: [name], MD  Admit date: 02/20/2016 Discharge date: 02/24/2016  Admitted From: Home Discharge disposition: SNF, [facility]   Recommendations for Outpatient Follow-Up:   1. Check hemoglobin in 2-3 days to ensure stability. 2. Re-assess pain and need for opiates at discharge. No opiate prescriptions provided but patient may resume prior pain regimen if okay with treating physician. 3. Follow up final blood culture results. Negative to date.   Discharge Diagnosis:   Principal Problem:    Seizure ([place]) Active Problems:    PAF (paroxysmal atrial fibrillation) (HCC)    Chronic diastolic heart failure (HCC)    Diabetes mellitus (HCC)    Essential hypertension    CKD (chronic kidney disease), stage IV (HCC)    Anemia of chronic disease    MDS (myelodysplastic syndrome) (HCC)    Thrombocytopenia (HCC)    Leukopenia    Hematuria    Pressure ulcer  Discharge Condition: Improved.  Diet recommendation: Low sodium, heart healthy.  Carbohydrate-modified.     History of Present Illness:   [name]. is an 80 y.o. male with a PMH of chronic diastolic CHF, diabetes, hypertension, stage IV CK D, myelodysplastic syndrome and PAF who was admitted 02/20/16 with new onset seizures. Upon initial evaluation in the ED, he was found to be profoundly anemic with a hemoglobin of 3.4 in the setting of chronic hematuria. He was transfused 4 units of PRBCs and urologist/neurologist consulted for assistance with management.  Hospital Course by Problem:   Principal Problems:   Seizure ([place]) CT of the brain was performed and revealed no evidence of hemorrhage,or mass lesion,or prior ischemic change. There was evidence of a mild to moderate diffuse atrophy.Evaluated by neurologist with recommendations to proceed with seizure precautions, EEG, speech evaluation, and  frequent neurologic checks. No antiepileptic medications were initiated given that this was his first seizure. EEG was normal, suspect seizure was reflective of profound anemia.    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 He has received a total of 4 units PRBCs. Status post cystoscopy with clot evacuation / fulgeration 02/22/16. Received 1 more unit of PRBCs on the date of discharge due to a hemoglobin of 7.6. Recommend a CBC in 2-3 days to assure stability of hemoglobin.  Active Problems:   Hypokalemia Likely from Lasix. We'll place on 10 mEq of potassium supplementation daily.    Left upper extremity swelling Doppler done to rule out DVT. Negative. Add low dose colchicine. Possibly related to gout.    PAF (paroxysmal atrial fibrillation) (HCC)/sinus bradycardia/status post pacemaker extraction secondary to history of MRSA bacteremia/elevated troponin consistent with demand ischemia Clearly not a candidate for blood thinners. Due to bradycardia, clonidine was discontinued and his dose of Coreg was decreased. Continue amiodarone. History of recent pacemaker extraction secondary to MRSA bacteremia. He completed his course of antibiotic therapy on 02/12/16. Mild elevation of troponins noted, trend is flat. These findings are most consistent with demand ischemia in the setting of profound anemia.    Chronic diastolic heart failure ([place]) Remains well compensated during his hospital stay. Continue Lasix.    Diabetes mellitus ([place]) Resume prehospital insulin regimen.  Essential hypertension Continue Coreg, Lasix, Imdur and hydralazine.     CKD (chronic kidney disease), stage IV  (HCC) Baseline creatinine appears to be around 3-4. Current creatinine is 2.05. Suspect he had some chronic obstruction, possibly from blood clots. Creatinine now improved.    MDS (myelodysplastic syndrome) (HCC) with chronic pancytopenia/anemia/leukopenia/thrombocytopenia   Thrombocytopenia (HCC) Patient is followed by Dr. Alvy Bimler. Receives Aranesp every 2 weeks at the cancer center. Dr. Alvy Bimler was made aware of the patient's hospitalization and will follow up with him at his regular appointment.    Hyperlipidemia Continue Lipitor.   Medical Consultants:    None.   Discharge Exam:   Vitals:   02/24/16 0546 02/24/16 1434  BP: (!) 173/72 (!) 162/59  Pulse: 75 82  Resp: 16 18  Temp: 98.9 F (37.2 C) 99.3 F (37.4 C)   Vitals:   02/23/16 1403 02/23/16 2152 02/24/16 0546 02/24/16 1434  BP: (!) 166/68 (!) 141/51 (!) 173/72 (!) 162/59  Pulse: 76 67 75 82  Resp:  16 16 18   Temp:  98.3 F (36.8 C) 98.9 F (37.2 C) 99.3 F (37.4 C)  TempSrc:  Oral Oral Oral  SpO2:  98% 100% 100%  Weight:   86.1 kg (189 lb 13.1 oz)   Height:        General exam: Appears calm and comfortable.  Respiratory system: Clear to auscultation. Respiratory effort normal. Cardiovascular system: S1 & S2 heard, RRR. No JVD,  rubs, gallops or clicks. No murmurs. Gastrointestinal system: Abdomen is nondistended, soft and nontender. No organomegaly or masses felt. Normal bowel sounds heard. Central nervous system: Alert and oriented. No focal neurological deficits. Extremities: Left upper extremity swelling and pain. Skin: No rashes, lesions or ulcers. Psychiatry: Judgement and insight appear diminished. Mood & affect flat.    The results of significant diagnostics from this hospitalization (including imaging, microbiology, ancillary and laboratory) are listed below for reference.     Procedures and Diagnostic Studies:   No results found.   Labs:   Basic Metabolic Panel:  Recent Labs Lab  02/20/16 1430 02/21/16 0627 02/23/16 0500 02/24/16 0400  NA 141 144 142 138  K 4.4 3.8 3.2* 3.4*  CL 110 110 113* 110  CO2 21* 23 21* 22  GLUCOSE 228* 113* 115* 115*  BUN 35* 32* 29* 31*  CREATININE 2.27* 2.05* 2.00* 1.96*  CALCIUM 7.7* 7.7* 7.7* 7.7*   GFR Estimated Creatinine Clearance: 32.1 mL/min (by C-G formula based on SCr of 1.96 mg/dL). Liver Function Tests:  Recent Labs Lab 02/20/16 1430 02/21/16 0627  AST 27 21  ALT 6* <5*  ALKPHOS 44 37*  BILITOT 0.2* 0.7  PROT 5.4* 5.1*  ALBUMIN 2.2* 2.1*   No results for input(s): LIPASE, AMYLASE in the last 168 hours. No results for input(s): AMMONIA in the last 168 hours. Coagulation profile  Recent Labs Lab 02/20/16 1430 02/21/16 0627  INR 1.28 1.30    CBC:  Recent Labs Lab 02/20/16 1645 02/20/16 2339 02/21/16 0627 02/21/16 1820 02/22/16 0850 02/23/16 0500 02/24/16 0400  WBC 7.4 7.1 6.8  --  8.7 9.8 8.7  NEUTROABS 5.9  --   --   --   --   --   --   HGB 3.4* 4.6* 6.2* 8.2* 8.2* 8.2* 7.6*  HCT 12.2* 14.8* 19.4* 25.4* 24.6* 25.1* 23.2*  MCV 113.0* 105.0* 94.2  --  93.5 95.8 97.1  PLT 113* 106* 86*  --  87* 107* 100*   Cardiac Enzymes:  Recent Labs  Lab 02/20/16 1816 02/21/16 0627  TROPONINI 0.04* 0.05*   BNP: Invalid input(s): POCBNP CBG:  Recent Labs Lab 02/23/16 0735 02/23/16 1132 02/23/16 1635 02/23/16 2158 02/24/16 0747  GLUCAP 114* 142* 135* 188* 114*   Microbiology Recent Results (from the past 240 hour(s))  MRSA PCR Screening     Status: None   Collection Time: 02/20/16 10:43 PM  Result Value Ref Range Status   MRSA by PCR NEGATIVE NEGATIVE Final    Comment:        The GeneXpert MRSA Assay (FDA approved for NASAL specimens only), is one component of a comprehensive MRSA colonization surveillance program. It is not intended to diagnose MRSA infection nor to guide or monitor treatment for MRSA infections.   Culture, blood (Routine X 2) w Reflex to ID Panel     Status:  None (Preliminary result)   Collection Time: 02/20/16 11:28 PM  Result Value Ref Range Status   Specimen Description BLOOD RIGHT HAND  Final   Special Requests IN PEDIATRIC BOTTLE 0.5CC  Final   Culture NO GROWTH 3 DAYS  Final   Report Status PENDING  Incomplete  Culture, blood (Routine X 2) w Reflex to ID Panel     Status: None (Preliminary result)   Collection Time: 02/20/16 11:35 PM  Result Value Ref Range Status   Specimen Description BLOOD RIGHT HAND  Final   Special Requests BOTTLES DRAWN AEROBIC ONLY 8CC  Final   Culture NO GROWTH 3 DAYS  Final   Report Status PENDING  Incomplete     Discharge Instructions:   Discharge Instructions    Call MD for:  extreme fatigue    Complete by:  As directed   Call MD for:  persistant dizziness or light-headedness    Complete by:  As directed   Call MD for:  temperature >100.4    Complete by:  As directed   Diet - low sodium heart healthy    Complete by:  As directed   Diet Carb Modified    Complete by:  As directed   Increase activity slowly    Complete by:  As directed       Medication List    STOP taking these medications   ceFAZolin 1 GM/50ML Commonly known as:  ANCEF   cloNIDine 0.1 MG tablet Commonly known as:  CATAPRES   oxyCODONE-acetaminophen 5-325 MG tablet Commonly known as:  PERCOCET/ROXICET     TAKE these medications   amiodarone 200 MG tablet Commonly known as:  PACERONE Take one-half tablet by  mouth daily What changed:  See the new instructions.   ARTHRITIS PAIN RELIEF 650 MG CR tablet Generic drug:  acetaminophen Take 650 mg by mouth 3 (three) times daily.   atorvastatin 80 MG tablet Commonly known as:  LIPITOR Take 80 mg by mouth at bedtime.   carvedilol 6.25 MG tablet Commonly known as:  COREG Take 1 tablet (6.25 mg total) by mouth 2 (two) times daily with a meal. What changed:  See the new instructions.   colchicine 0.6 MG tablet Take 0.5 tablets (0.3 mg total) by mouth daily.     cyanocobalamin 1000 MCG/ML injection Commonly known as:  (VITAMIN B-12) Inject 1,000 mcg into the muscle every 30 (thirty) days. Vitamin B12 - last injection 09/01/15   epoetin alfa 10000 UNIT/ML injection Commonly known as:  EPOGEN,PROCRIT Inject 10,000 Units into the skin every 14 (fourteen) days. Done at [facility] cancer center - last injection 09/22/15   ferrous sulfate 325 (65  FE) MG tablet Take 1 tablet (325 mg total) by mouth 3 (three) times daily with meals.   furosemide 80 MG tablet Commonly known as:  LASIX Take 1 tablet (80 mg total) by mouth daily. What changed:  additional instructions   hydrALAZINE 25 MG tablet Commonly known as:  APRESOLINE Take 3 tablets (75 mg total) by mouth 4 (four) times daily. What changed:  medication strength  how much to take  when to take this   insulin aspart 100 UNIT/ML injection Commonly known as:  novoLOG Inject 0-9 Units into the skin 3 (three) times daily with meals. What changed:  how much to take  when to take this  additional instructions  Another medication with the same name was removed. Continue taking this medication, and follow the directions you see here.   isosorbide mononitrate 120 MG 24 hr tablet Commonly known as:  IMDUR Take 1 tablet by mouth  daily   leuprolide 30 MG injection Commonly known as:  LUPRON Inject 30 mg into the muscle every 4 (four) months.   multivitamin with minerals Tabs tablet Take 1 tablet by mouth every morning.   pantoprazole 40 MG tablet Commonly known as:  PROTONIX Take 1 tablet by mouth daily.   potassium chloride 10 MEQ tablet Commonly known as:  K-DUR Take 1 tablet (10 mEq total) by mouth daily.   potassium chloride SA 20 MEQ tablet Commonly known as:  K-DUR,KLOR-CON Take 1.5 tablets (30 mEq total) by mouth daily.   predniSONE 10 MG tablet Commonly known as:  DELTASONE Please use 40 mg oral daily for 3 days, then 30 mg oral daily for 3 days, then 20 mg oral daily for 3  days, then 10 mg oral daily for 3 days   RAYALDEE 30 MCG Cpcr Generic drug:  Calcifediol ER Take 30 mcg by mouth at bedtime.   rifampin 300 MG capsule Commonly known as:  RIFADIN Take 600 mg by mouth 2 (two) times daily.   senna-docusate 8.6-50 MG tablet Commonly known as:  Senokot-S Take 2 tablets by mouth 2 (two) times daily.   silodosin 8 MG Caps capsule Commonly known as:  RAPAFLO Take 8 mg by mouth at bedtime.   sodium bicarbonate 650 MG tablet Take 1 tablet (650 mg total) by mouth 2 (two) times daily.   UNABLE TO FIND Med Name: Med pass 120 mL 3 times daily       Contact information for follow-up providers    Alexis Frock, MD. Schedule an appointment as soon as possible for a visit in 2 week(s).   Specialty:  Urology Why:  follow up for foley catheter Contact information: [place] [address] [phone]        [name], MD. Schedule an appointment as soon as possible for a visit today.   Specialty:  Family Medicine Why:  As needed Contact information: [place] [address] [phone]            Contact information for after-discharge care    Destination    [facility] .   Specialty:  Skilled Nursing Facility Contact information: [place] [phone]                   Time coordinating discharge: 35 minutes.  Signed:  RAMA,CHRISTINA  Pager [phone] [facility] 02/24/2016, 3:52 PM

## 2016-02-24 NOTE — NC FL2 (Signed)
[place] LEVEL OF CARE SCREENING TOOL     IDENTIFICATION  Patient Name: [name]. Birthdate: [birth year] Sex: male Admission Date (Current Location): 02/20/2016  [facility] and [place] Number:  Herbalist and Address:  [facility],  [place] [address] [place]      Provider Number: [id number]  Attending Physician Name and Address:  Venetia Maxon Rama, MD  Relative Name and Phone Number:       Current Level of Care: Hospital Recommended Level of Care: [place] Prior Approval Number:    Date Approved/Denied:   PASRR Number: [id number]  Discharge Plan: SNF    Current Diagnoses: Patient Active Problem List   Diagnosis Date Noted  . Pressure ulcer 02/23/2016  . Seizure ([place]) 02/20/2016  . Palliative care encounter   . Goals of care, counseling/discussion   . Arterial hypotension   . Pacemaker infection ([place])   . Acute renal failure superimposed on stage 4 chronic kidney disease ([place]) 01/26/2016  . Bacteremia due to Staphylococcus aureus   . Pseudogout involving multiple joints 01/24/2016  . Bladder mass 01/24/2016  . Hemoptysis   . Pain   . Left upper extremity swelling   . Staphylococcus aureus bacteremia 01/17/2016  . CAP (community acquired pneumonia) 01/16/2016  . S/P hernia repair 10/28/2015  . Right inguinal hernia 09/09/2015  . GIB (gastrointestinal bleeding) 08/29/2015  . Hematuria 05/19/2015  . Thrombocytopenia ([place]) 11/11/2014  . Leukopenia 11/11/2014  . B12 deficiency 08/31/2013  . Pacemaker 08/26/2013  . On amiodarone therapy 08/21/2013  . Gout due to renal impairment, left ankle and foot 06/13/2013    Class: Acute  . CAD of autologous bypass graft 06/05/2013  . MDS (myelodysplastic syndrome) ([place]) 05/22/2013  . Anemia of chronic disease 03/19/2013  . CKD (chronic kidney disease), stage IV ([place]) 03/18/2013  . NSVT (nonsustained ventricular tachycardia) ([place]) 03/18/2013  . Chronic  diastolic heart failure ([place]) 03/17/2013  . Diabetes mellitus ([place]) 03/17/2013  . Essential hypertension 03/17/2013  . Mesenteric artery stenosis ([place]) 05/01/2012  . PAF (paroxysmal atrial fibrillation) ([place]) 12/24/2011  . PAD (peripheral artery disease) ([place]) 12/24/2011  . Chronic vascular insufficiency of intestine ([facility]) 12/20/2011    Orientation RESPIRATION BLADDER Height & Weight     Self, Time, Situation, Place  Normal Continent Weight: 189 lb 13.1 oz (86.1 kg) Height:  5\' 10"  (177.8 cm)  BEHAVIORAL SYMPTOMS/MOOD NEUROLOGICAL BOWEL NUTRITION STATUS      Continent Diet (Carb Modified)  AMBULATORY STATUS COMMUNICATION OF NEEDS Skin   Extensive Assist Verbally PU Stage and Appropriate Care (Pressure Ulcer 02/20/16 Stage II -  Partial thickness loss of dermis presenting as a shallow open ulcer with a red, pink wound bed without slough. (buttock) & Incision (Closed) 02/22/16 Perineum)                       Personal Care Assistance Level of Assistance  Bathing, Dressing Bathing Assistance: Limited assistance Feeding assistance: Limited assistance Dressing Assistance: Limited assistance     Functional Limitations Info  Sight, Hearing, Speech Sight Info: Adequate Hearing Info: Adequate Speech Info: Adequate    SPECIAL CARE FACTORS FREQUENCY  PT (By licensed PT), OT (By licensed OT)     PT Frequency: 5 OT Frequency: 5            Contractures      Additional Factors Info  Code Status, Allergies Code Status Info: Fullcode Allergies Info: Allergies:  Nsaids, Ibuprofen, Ace  Inhibitors           Current Medications (02/24/2016):  This is the current hospital active medication list Current Facility-Administered Medications  Medication Dose Route Frequency Provider Last Rate Last Dose  . 0.9 %  sodium chloride infusion   Intravenous Once Bonnell Public, MD   Stopped at 02/22/16 1848  . 0.9 %  sodium chloride infusion   Intravenous Once Venetia Maxon Rama, MD       . acetaminophen (TYLENOL) tablet 650 mg  650 mg Oral Q6H PRN Rondel Jumbo, PA-C       Or  . acetaminophen (TYLENOL) suppository 650 mg  650 mg Rectal Q6H PRN Rondel Jumbo, PA-C      . amiodarone (PACERONE) tablet 100 mg  100 mg Oral Daily Rondel Jumbo, PA-C   100 mg at 02/24/16 1144  . atorvastatin (LIPITOR) tablet 80 mg  80 mg Oral QHS Rondel Jumbo, PA-C   80 mg at 02/23/16 2259  . bisacodyl (DULCOLAX) suppository 10 mg  10 mg Rectal Daily PRN Rondel Jumbo, PA-C      . Calcifediol ER CPCR 30 mcg  30 mcg Oral QHS Rondel Jumbo, PA-C   30 mcg at 02/23/16 2257  . carvedilol (COREG) tablet 6.25 mg  6.25 mg Oral BID WC Florencia Reasons, MD   6.25 mg at 02/24/16 1144  . colchicine tablet 0.3 mg  0.3 mg Oral Daily Venetia Maxon Rama, MD   0.3 mg at 02/24/16 1144  . ferrous sulfate tablet 325 mg  325 mg Oral TID WC Christina P Rama, MD      . furosemide (LASIX) tablet 80 mg  80 mg Oral Daily Rondel Jumbo, PA-C   80 mg at 02/24/16 1143  . hydrALAZINE (APRESOLINE) tablet 75 mg  75 mg Oral QID Florencia Reasons, MD   75 mg at 02/24/16 1143  . HYDROcodone-acetaminophen (NORCO/VICODIN) 5-325 MG per tablet 1-2 tablet  1-2 tablet Oral Q4H PRN Rondel Jumbo, PA-C   2 tablet at 02/23/16 1836  . insulin aspart (novoLOG) injection 0-9 Units  0-9 Units Subcutaneous TID WC Rondel Jumbo, PA-C   1 Units at 02/23/16 1747  . isosorbide mononitrate (IMDUR) 24 hr tablet 120 mg  120 mg Oral Daily Florencia Reasons, MD   120 mg at 02/24/16 1144  . magnesium citrate solution 1 Bottle  1 Bottle Oral Once PRN Rondel Jumbo, PA-C      . ondansetron [facility]) tablet 4 mg  4 mg Oral Q6H PRN Rondel Jumbo, PA-C       Or  . ondansetron [facility]) injection 4 mg  4 mg Intravenous Q6H PRN Rondel Jumbo, PA-C      . pantoprazole (PROTONIX) EC tablet 40 mg  40 mg Oral Daily Rondel Jumbo, PA-C   40 mg at 02/24/16 1143  . potassium chloride SA (K-DUR,KLOR-CON) CR tablet 20 mEq  20 mEq Oral BID Venetia Maxon Rama, MD   20 mEq at 02/24/16 1145  .  senna-docusate (Senokot-S) tablet 1 tablet  1 tablet Oral QHS PRN Rondel Jumbo, PA-C      . sodium bicarbonate tablet 650 mg  650 mg Oral BID Rondel Jumbo, PA-C   650 mg at 02/24/16 1143  . sodium chloride flush (NS) 0.9 % injection 10-40 mL  10-40 mL Intracatheter Q12H Duffy Bruce, MD   10 mL at 02/23/16 2301  . sodium chloride flush (NS) 0.9 % injection 10-40 mL  10-40 mL Intracatheter PRN Duffy Bruce, MD   10 mL at 02/23/16 1548  . sodium chloride flush (NS) 0.9 % injection 3 mL  3 mL Intravenous Q12H Rondel Jumbo, PA-C   3 mL at 02/24/16 1000  . tamsulosin (FLOMAX) capsule 0.4 mg  0.4 mg Oral QPC supper Rondel Jumbo, PA-C   0.4 mg at 02/23/16 1746  . traZODone (DESYREL) tablet 25 mg  25 mg Oral QHS PRN Rondel Jumbo, PA-C   25 mg at 02/22/16 2117     Discharge Medications: Please see discharge summary for a list of discharge medications.  Relevant Imaging Results:  Relevant Lab Results:   Additional Information SS#: [id number]  Standley Brooking, LCSW

## 2016-02-24 NOTE — Progress Notes (Addendum)
CSW received consult that patient was admitted from [facility]. CSW confirmed with patient that he plans to return to [facility] confirmed with Ivin Booty at [facility] that they would be able to take patient back.   Per RN, anticipating discharge this afternoon after patient receives a unit of blood.    [name], [facility] Clinical Social Worker cell #: [phone]

## 2016-02-24 NOTE — Discharge Instructions (Signed)
Blood Transfusion, Care After °Refer to this sheet in the next few weeks. These instructions provide you with information about caring for yourself after your procedure. Your health care provider may also give you more specific instructions. Your treatment has been planned according to current medical practices, but problems sometimes occur. Call your health care provider if you have any problems or questions after your procedure. °WHAT TO EXPECT AFTER THE PROCEDURE °After your procedure, it is common to have: °· Bruising and soreness at the IV site. °· Chills or fever. °· Headache. °HOME CARE INSTRUCTIONS °· Take medicines only as directed by your health care provider. Ask your health care provider if you can take an over-the-counter pain reliever in case you have a fever or headache a day or two after your transfusion. °· Return to your normal activities as directed by your health care provider. °SEEK MEDICAL CARE IF:  °· You develop redness or irritation at your IV site. °· You have persistent fever, chills, or headache. °· Your urine is darker than normal. °· Your urine turns pink, red, or brown.   °· The white part of your eye turns yellow (jaundice).   °· You feel weak after doing your normal activities.   °SEEK IMMEDIATE MEDICAL CARE IF:  °· You have trouble breathing. °· You have fever and chills along with: °¨ Anxiety. °¨ Chest or back pain. °¨ Flushed skin. °¨ Clammy skin. °¨ A rapid heartbeat. °¨ Nausea. °  °This information is not intended to replace advice given to you by your health care provider. Make sure you discuss any questions you have with your health care provider. °  °Document Released: 07/19/2014 Document Reviewed: 07/19/2014 °Elsevier Interactive Patient Education ©2016 [employer] ° °

## 2016-02-24 NOTE — Progress Notes (Signed)
Patient is set to discharge back to [facility] today. Patient & family at bedside made aware. Discharge packet given to RN, Janett Billow. RN to call [facility] for transport when ready.     [name], [facility] Clinical Social Worker cell #: [phone]

## 2016-02-25 ENCOUNTER — Non-Acute Institutional Stay (SKILLED_NURSING_FACILITY): Payer: Medicare Other | Admitting: Internal Medicine

## 2016-02-25 ENCOUNTER — Encounter: Payer: Self-pay | Admitting: Internal Medicine

## 2016-02-25 DIAGNOSIS — R569 Unspecified convulsions: Secondary | ICD-10-CM

## 2016-02-25 DIAGNOSIS — I10 Essential (primary) hypertension: Secondary | ICD-10-CM

## 2016-02-25 DIAGNOSIS — R531 Weakness: Secondary | ICD-10-CM | POA: Diagnosis not present

## 2016-02-25 DIAGNOSIS — D62 Acute posthemorrhagic anemia: Secondary | ICD-10-CM

## 2016-02-25 DIAGNOSIS — I25810 Atherosclerosis of coronary artery bypass graft(s) without angina pectoris: Secondary | ICD-10-CM | POA: Diagnosis not present

## 2016-02-25 DIAGNOSIS — M1180 Other specified crystal arthropathies, unspecified site: Secondary | ICD-10-CM

## 2016-02-25 DIAGNOSIS — E1122 Type 2 diabetes mellitus with diabetic chronic kidney disease: Secondary | ICD-10-CM

## 2016-02-25 DIAGNOSIS — E46 Unspecified protein-calorie malnutrition: Secondary | ICD-10-CM

## 2016-02-25 DIAGNOSIS — R7881 Bacteremia: Secondary | ICD-10-CM

## 2016-02-25 DIAGNOSIS — I480 Paroxysmal atrial fibrillation: Secondary | ICD-10-CM | POA: Diagnosis not present

## 2016-02-25 DIAGNOSIS — D696 Thrombocytopenia, unspecified: Secondary | ICD-10-CM | POA: Diagnosis not present

## 2016-02-25 DIAGNOSIS — R627 Adult failure to thrive: Secondary | ICD-10-CM

## 2016-02-25 DIAGNOSIS — N184 Chronic kidney disease, stage 4 (severe): Secondary | ICD-10-CM

## 2016-02-25 DIAGNOSIS — M1120 Other chondrocalcinosis, unspecified site: Secondary | ICD-10-CM

## 2016-02-25 DIAGNOSIS — E876 Hypokalemia: Secondary | ICD-10-CM | POA: Diagnosis not present

## 2016-02-25 DIAGNOSIS — K5900 Constipation, unspecified: Secondary | ICD-10-CM

## 2016-02-25 DIAGNOSIS — C61 Malignant neoplasm of prostate: Secondary | ICD-10-CM

## 2016-02-25 DIAGNOSIS — B9561 Methicillin susceptible Staphylococcus aureus infection as the cause of diseases classified elsewhere: Secondary | ICD-10-CM

## 2016-02-25 DIAGNOSIS — E785 Hyperlipidemia, unspecified: Secondary | ICD-10-CM

## 2016-02-25 DIAGNOSIS — Z794 Long term (current) use of insulin: Secondary | ICD-10-CM

## 2016-02-25 DIAGNOSIS — S3722XS Contusion of bladder, sequela: Secondary | ICD-10-CM

## 2016-02-25 LAB — TYPE AND SCREEN
ABO/RH(D): O POS
Antibody Screen: NEGATIVE
Unit division: 0

## 2016-02-25 MED FILL — Medication: Qty: 1 | Status: AC

## 2016-02-25 NOTE — Progress Notes (Signed)
LOCATION: [place]  PCP: [name], MD   Code Status: DNR  Goals of care: Advanced Directive information Advanced Directives 02/20/2016  Does patient have an advance directive? Yes;No  Type of Advance Directive -  Does patient want to make changes to advanced directive? -  Copy of advanced directive(s) in chart? -  Would patient like information on creating an advanced directive? No - patient declined information  Pre-existing out of facility DNR order (yellow form or pink MOST form) -       Extended Emergency Contact Information Primary Emergency Contact: Lorelee Market Address: [address]          [place], [place] [address] [place] Phone: [phone] Relation: Spouse Secondary Emergency Contact: Lafond,Charles Address: [address] [place],  [address] [place] Phone: [phone] Mobile Phone: [phone] Relation: Son   Allergies  Allergen Reactions  . Nsaids Nausea Only    GI issue  . Ibuprofen Other (See Comments)    GI Issues  . Ace Inhibitors Cough    Chief Complaint  Patient presents with  . Readmit To SNF    Readmission     HPI:  Patient is a 80 y.o. male seen today for short term rehabilitation post hospital re-admission from 02/20/16-02/24/16 with new onset seizure and acute blood loss anemia from hematuria with Hb 3.4. He was transfused PRBC and was seen by urology and neurology. CT brain and EEG were unrevealing. He was not started on seizure medications. He underwent cystoscopy with clot evacuation/ fulgeration on 02/22/16. His lytes were repleted and his clonidine was discontinued and coreg dose decreased with hypotension. He is seen in his room today. He was in this SNF prior to this hospitalization undergoing rehabilitation. he was treated for staphylococcus aureus bacteremia with sepsis, multifocal pneumonia, blood loss anemia and had his pacemaker extraction on 01/27/16.  He was on prednisone then for pseudogout.    Review of Systems:  Constitutional: Negative for fever, chills, diaphoresis. Feels weak and tired. HENT: Negative for headache, congestion, nasal discharge, sore throat, difficulty swallowing.   Eyes: Negative for blurred vision, double vision and discharge.  Respiratory: Negative for cough, shortness of breath and wheezing.   Cardiovascular: Negative for chest pain, palpitations, leg swelling.  Gastrointestinal: Negative for heartburn, nausea, vomiting, abdominal pain, loss of appetite. Last bowel movement was 2 days back. Genitourinary: Negative for flank pain.  Musculoskeletal: Negative for back pain, fall in the facility.  Skin: Negative for itching, rash.  Neurological: Negative for dizziness. Psychiatric/Behavioral: Negative for depression   Past Medical History:  Diagnosis Date  . Angiomyolipoma 2009   On both kidneys noted in 2009  . Arthritis    neck and left wrist  . Atrial fibrillation ([place])   . CAD (coronary artery disease)    s/b CABG 1994, and subsequent stents. Repeat CABG 12/2011,  . Chronic diastolic heart failure ([place])   . CKD (chronic kidney disease)   . Depressive disorder   . Diabetes mellitus type 2 with peripheral artery disease ([facility])    DIET CONTROLLED  . DVT (deep venous thrombosis) ([place]) 2011   Right arm  . Gastroparesis   . GERD (gastroesophageal reflux disease)   . Gout   . History of hiatal hernia   . Hyperlipidemia   . Hypertension   . Internal hemorrhoids without mention of complication   . Ischemic colitis ([place])   .  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 Myelodysplastic syndrome ([place]) 05/22/2013   With low hemoglobin and platelets treated with Procrit  . Osteopenia   . Peptic ulcer    S/p partial gastrectomy in 1969  . Peripheral artery disease ([place])   . Pneumonia 01/16/2016  . Presence of permanent cardiac pacemaker   . Prostate cancer ([place]) 1997   XRT and lupron  . Renal artery stenosis ([place])   .  Sick sinus syndrome ([place])   . Vitamin B 12 deficiency    Past Surgical History:  Procedure Laterality Date  . ABDOMINAL ANGIOGRAM N/A 05/25/2011   Procedure: ABDOMINAL ANGIOGRAM;  Surgeon: Serafina Mitchell, MD;  Location[facility] C[facility] LAB;  Service: Cardiovascular;  Laterality: N/A;  . ABDOMINAL ANGIOGRAM N/A 02/13/2013   Procedure: ABDOMINAL ANGIOGRAM;  Surgeon: Serafina Mitchell, MD;  Location: [facility] CATH LAB;  Service: Cardiovascular;  Laterality: N/A;  . APPENDECTOMY  1991  . CELIAC ARTERY ANGIOPLASTY  05-16-12   and stenting  . CHOLECYSTECTOMY  Oct 2009   Laparoscopic  . CORONARY ARTERY BYPASS GRAFT  01/22/1993  . CORONARY ARTERY BYPASS GRAFT  01/03/2012   Procedure: REDO CORONARY ARTERY BYPASS GRAFTING (CABG);  Surgeon: Gaye Pollack, MD;  Location: [place];  Service: Open Heart Surgery;  Laterality: N/A;  Redo CABG x  using bilateral internal mammary arteries;  left leg greater saphenous vein harvested endoscopically  . FOREIGN BODY REMOVAL ABDOMINAL Right 01/27/2016   Procedure: EXPOSURE OF RIGHT COMMON FEMORAL ARTERY AND REMOVAL FOREIGN;  Surgeon: Evans Lance, MD;  Location: [place];  Service: Cardiovascular;  Laterality: Right;  . HIATAL HERNIA REPAIR     and ulcer repair  . I&D EXTREMITY Left 01/21/2016   Procedure: IRRIGATION AND DEBRIDEMENT EXTREMITY;  Surgeon: Milly Jakob, MD;  Location: [place];  Service: Orthopedics;  Laterality: Left;  . INGUINAL HERNIA REPAIR Right 10/28/2015   Procedure: OPEN RIGHT INGUINAL HERNIA REPAIR;  Surgeon: Greer Pickerel, MD;  Location: [facility];  Service: General;  Laterality: Right;  . INSERTION OF MESH Right 10/28/2015   Procedure: INSERTION OF MESH;  Surgeon: Greer Pickerel, MD;  Location: [facility];  Service: General;  Laterality: Right;  . IR GENERIC HISTORICAL  02/02/2016   IR FLUORO GUIDE CV LINE LEFT 02/02/2016 Markus Daft, MD [facility]  . IR GENERIC HISTORICAL  02/02/2016   IR US GUIDE VASC ACCESS LEFT 02/02/2016 Markus Daft, MD [facility]  . LEFT HEART  CATHETERIZATION WITH CORONARY/GRAFT ANGIOGRAM  12/24/2011   Procedure: LEFT HEART CATHETERIZATION WITH Beatrix Fetters;  Surgeon: Sinclair Grooms, MD;  Location: [facility] CATH LAB;  Service: Cardiovascular;;  . LOWER EXTREMITY ANGIOGRAM Bilateral 05/25/2011   Procedure: LOWER EXTREMITY ANGIOGRAM;  Surgeon: Serafina Mitchell, MD;  Location: [facility] CATH LAB;  Service: Cardiovascular;  Laterality: Bilateral;  . OTHER SURGICAL HISTORY  02/13/13   superior mesenteric artery angiogram  . OTHER SURGICAL HISTORY  05/16/12   Stent in stomach  . PACEMAKER GENERATOR CHANGE  12/10/2003   SJM Identity XL DR performed by Dr Leonia Reeves  . PACEMAKER INSERTION  10/18/1994   DDD pacemaker, [employer]. Gen change 12/10/2003.  Marland Kitchen PACEMAKER LEAD REMOVAL Right 01/27/2016   Procedure: PACEMAKER EXTRACTION;  Surgeon: Evans Lance, MD;  Location: [facility];  Service: Cardiovascular;  Laterality: Right;  DR. Roxy Manns TO BACKUP CASE  . PARTIAL GASTRECTOMY  1969   Hx of ulcer s/p partial gastrectomy/ has pernicious anemia  . RENAL ANGIOGRAM N/A 02/13/2013   Procedure: RENAL ANGIOGRAM;  Surgeon: Serafina Mitchell, MD;  Location: [facility] CATH LAB;  Service: Cardiovascular;  Laterality: N/A;  . TEE WITHOUT CARDIOVERSION N/A 01/26/2016   Procedure: TRANSESOPHAGEAL ECHOCARDIOGRAM (TEE);  Surgeon: Sueanne Margarita, MD;  Location: [facility] ENDOSCOPY;  Service: Cardiovascular;  Laterality: N/A;  . TEE WITHOUT CARDIOVERSION N/A 01/27/2016   Procedure: TRANSESOPHAGEAL ECHOCARDIOGRAM (TEE);  Surgeon: Evans Lance, MD;  Location: [facility];  Service: Cardiovascular;  Laterality: N/A;  . THROMBECTOMY / EMBOLECTOMY SUBCLAVIAN ARTERY  02/02/10   Right subclavian thromboectomy and venous angioplasty, and chronic mesenteric ischemia with Herculink stenting to superior mesenteric and celiac arteries - Dr. Trula Slade  . TRANSURETHRAL RESECTION OF PROSTATE N/A 02/22/2016   Procedure: CYSTO, CLOT EVACUATION, FULGERATION;  Surgeon: Alexis Frock, MD;  Location: [facility];  Service:  Urology;  Laterality: N/A;  . VISCERAL ANGIOGRAM N/A 05/25/2011   Procedure: VISCERAL ANGIOGRAM;  Surgeon: Serafina Mitchell, MD;  Location: [facility] CATH LAB;  Service: Cardiovascular;  Laterality: N/A;  . VISCERAL ANGIOGRAM Bilateral 12/28/2011   Procedure: VISCERAL ANGIOGRAM;  Surgeon: Serafina Mitchell, MD;  Location: [facility] CATH LAB;  Service: Cardiovascular;  Laterality: Bilateral;  . VISCERAL ANGIOGRAM N/A 05/16/2012   Procedure: VISCERAL ANGIOGRAM;  Surgeon: Serafina Mitchell, MD;  Location: [facility] CATH LAB;  Service: Cardiovascular;  Laterality: N/A;  . VISCERAL ANGIOGRAM N/A 02/13/2013   Procedure: VISCERAL ANGIOGRAM;  Surgeon: Serafina Mitchell, MD;  Location: [facility] CATH LAB;  Service: Cardiovascular;  Laterality: N/A;   Social History:   reports that he quit smoking about 46 years ago. His smoking use included Cigarettes. He quit after 20.00 years of use. He has never used smokeless tobacco. He reports that he does not drink alcohol or use drugs.  Family History  Problem Relation Age of Onset  . Diabetes Mother   . Heart disease Mother     Heart Disease before age 73  . Hyperlipidemia Mother   . Hypertension Mother   . CVA Mother 42    cause of death  . Cancer Father     stomach/liver  . Hypertension Father     possibly hypertensive  . Cancer Sister     Breast cancer  . CAD Brother   . Heart disease Brother   . Cancer Paternal Uncle     colon  . Heart disease Sister   . Diabetes Sister   . Hypertension Sister   . Heart disease Daughter     Heart Disease before age 6  . Hypertension Daughter   . Heart attack Daughter     Medications:   Medication List       Accurate as of 02/25/16  4:48 PM. Always use your most recent med list.          amiodarone 200 MG tablet Commonly known as:  PACERONE Take one-half tablet by  mouth daily   ARTHRITIS PAIN RELIEF 650 MG CR tablet Generic drug:  acetaminophen Take 650 mg by mouth 3 (three) times daily.   atorvastatin 80 MG tablet Commonly  known as:  LIPITOR Take 80 mg by mouth at bedtime.   carvedilol 6.25 MG tablet Commonly known as:  COREG Take 1 tablet (6.25 mg total) by mouth 2 (two) times daily with a meal.   colchicine 0.6 MG tablet Take 0.5 tablets (0.3 mg total) by mouth daily.   cyanocobalamin 1000 MCG/ML injection Commonly known as:  (VITAMIN B-12) Inject 1,000 mcg into the muscle every 30 (thirty) days. Vitamin B12 - last injection 09/01/15   epoetin  alfa 10000 UNIT/ML injection Commonly known as:  EPOGEN,PROCRIT Inject 10,000 Units into the skin every 14 (fourteen) days. Done at [facility] - last injection 09/22/15   ferrous sulfate 325 (65 FE) MG tablet Take 1 tablet (325 mg total) by mouth 3 (three) times daily with meals.   furosemide 80 MG tablet Commonly known as:  LASIX Take 1 tablet (80 mg total) by mouth daily.   hydrALAZINE 25 MG tablet Commonly known as:  APRESOLINE Take 3 tablets (75 mg total) by mouth 4 (four) times daily.   insulin aspart 100 UNIT/ML injection Commonly known as:  novoLOG Inject 0-9 Units into the skin 3 (three) times daily with meals.   isosorbide mononitrate 120 MG 24 hr tablet Commonly known as:  IMDUR Take 1 tablet by mouth  daily   leuprolide 30 MG injection Commonly known as:  LUPRON Inject 30 mg into the muscle every 4 (four) months.   multivitamin with minerals Tabs tablet Take 1 tablet by mouth every morning.   pantoprazole 40 MG tablet Commonly known as:  PROTONIX Take 1 tablet by mouth daily.   potassium chloride 10 MEQ tablet Commonly known as:  K-DUR Take 1 tablet (10 mEq total) by mouth daily.   predniSONE 10 MG tablet Commonly known as:  DELTASONE Please use 40 mg oral daily for 3 days, then 30 mg oral daily for 3 days, then 20 mg oral daily for 3 days, then 10 mg oral daily for 3 days   RAYALDEE 30 MCG Cpcr Generic drug:  Calcifediol ER Take 30 mcg by mouth at bedtime.   rifampin 300 MG capsule Commonly known as:  RIFADIN Take  600 mg by mouth 2 (two) times daily.   senna-docusate 8.6-50 MG tablet Commonly known as:  Senokot-S Take 2 tablets by mouth 2 (two) times daily.   silodosin 8 MG Caps capsule Commonly known as:  RAPAFLO Take 8 mg by mouth at bedtime.   sodium bicarbonate 650 MG tablet Take 1 tablet (650 mg total) by mouth 2 (two) times daily.   UNABLE TO FIND Med Name: Med pass 120 mL 3 times daily       Immunizations: Immunization History  Administered Date(s) Administered  . Influenza Split 03/29/2015  . Influenza,inj,Quad PF,36+ Mos 03/20/2013  . PPD Test 01/16/2016     Physical Exam:  Vitals:   02/25/16 1239  BP: (!) 166/76  Pulse: 76  Resp: 15  Temp: 98.6 F (37 C)  TempSrc: Oral  SpO2: 97%  Weight: 189 lb (85.7 kg)  Height: 5\' 10"  (1.778 m)   Body mass index is 27.12 kg/m.  General- elderly male, frail and ill appearing, in no acute distress Head- normocephalic, atraumatic Nose- no nasal discharge Throat- moist mucus membrane Eyes- PERRLA, EOMI, no pallor, no icterus Neck- no cervical lymphadenopathy Cardiovascular- irregular heart rate, systolic murmur present, no leg edema Respiratory- bilateral poor air entry, no wheeze, no rhonchi, no crackles, no use of accessory muscles Abdomen- bowel sounds present, soft, mild lower abdominal wall tenderness, no CVA tenderness, foley catheter in place with dark urine in bag Musculoskeletal- able to move all 4 extremities, generalized weakness more to his lower extremities, edema to his left arm noted Neurological- alert and oriented to person, place and time Skin- warm and dry, tunneled picc line to left chest Psychiatry- normal mood and affect    Labs reviewed: Basic Metabolic Panel:  Recent Labs  02/01/16 0450 02/02/16 0553 02/02/16 0554 02/03/16 0601  02/21/16 0627 02/23/16  0500 02/24/16 0400  NA 139 139  --  139  < > 144 142 138  K 3.3* 3.3*  --  3.8  < > 3.8 3.2* 3.4*  CL 110 109  --  109  < > 110 113* 110    CO2 21* 23  --  22  < > 23 21* 22  GLUCOSE 97 145*  --  118*  < > 113* 115* 115*  BUN 89* 82*  --  76*  < > 32* 29* 31*  CREATININE 4.04* 3.73*  --  3.61*  < > 2.05* 2.00* 1.96*  CALCIUM 8.2* 8.1*  --  8.2*  < > 7.7* 7.7* 7.7*  MG  --   --  1.8  --   --   --   --   --   PHOS 3.9 3.5  --  4.1  --   --   --   --   < > = values in this interval not displayed. Liver Function Tests:  Recent Labs  01/16/16 1239  02/03/16 0601 02/20/16 1430 02/21/16 0627  AST 30  --   --  27 21  ALT 12*  --   --  6* <5*  ALKPHOS 27*  --   --  44 37*  BILITOT 0.7  --   --  0.2* 0.7  PROT 7.6  --   --  5.4* 5.1*  ALBUMIN 4.2  < > 1.9* 2.2* 2.1*  < > = values in this interval not displayed. No results for input(s): LIPASE, AMYLASE in the last 8760 hours. No results for input(s): AMMONIA in the last 8760 hours. CBC:  Recent Labs  01/12/16 1007 01/16/16 1239  02/20/16 1645  02/22/16 0850 02/23/16 0500 02/24/16 0400  WBC 3.3* 13.1*  < > 7.4  < > 8.7 9.8 8.7  NEUTROABS 1.4* 11.0*  --  5.9  --   --   --   --   HGB 9.3* 9.7*  < > 3.4*  < > 8.2* 8.2* 7.6*  HCT 28.3* 30.4*  < > 12.2*  < > 24.6* 25.1* 23.2*  MCV 101.1* 102.4*  < > 113.0*  < > 93.5 95.8 97.1  PLT 88* 100*  < > 113*  < > 87* 107* 100*  < > = values in this interval not displayed. Cardiac Enzymes:  Recent Labs  01/25/16 1519 02/20/16 1816 02/21/16 0627  CKTOTAL 60  --   --   TROPONINI  --  0.04* 0.05*   BNP: Invalid input(s): POCBNP CBG:  Recent Labs  02/23/16 2158 02/24/16 0747 02/24/16 1620  GLUCAP 188* 114* 195*    Radiological Exams: Ir Fluoro Guide Cv Line Left  Result Date: 02/02/2016 INDICATION: 80 year old with bacteremia. Request for placement of a tunneled central venous catheter. EXAM: FLUOROSCOPIC AND ULTRASOUND GUIDED PLACEMENT OF A TUNNELED CENTRAL VENOUS CATHETER Physician: Stephan Minister. Henn, MD FLUOROSCOPY TIME:  8 minutes and 42 seconds, 25.3 mGy MEDICATIONS: 3 mg versed, 100 mcg fentanyl. A radiology nurse  monitored the patient for moderate sedation. ANESTHESIA/SEDATION: Moderate sedation time: 45 minutes PROCEDURE: Informed consent was obtained for placement of a tunneled central venous catheter. The patient was placed supine on the interventional table. Ultrasound confirmed a patent left internal jugularvein. Ultrasound images were obtained for documentation. The left side of the neck was prepped and draped in a sterile fashion. The left side of the neck was anesthetized with 1% lidocaine. Maximal barrier sterile technique was utilized including caps, mask, sterile gowns,  sterile gloves, sterile drape, hand hygiene and skin antiseptic. A small incision was made with #11 blade scalpel. A 21 gauge needle directed into the left internal jugular vein with ultrasound guidance. Wire repeatedly went up towards the head. Left internal jugular vein was punctured multiple times in different orientations but the wire continued to up towards the head. Eventually, the left internal jugular vein was punctured with a 19 gauge needle using ultrasound guidance. A Bentson wire was advanced into the left subclavian vein. A 5 [place] Kumpe catheter was directed into the left innominate vein and down the SVC. Small incision was made below the left clavicle. A single lumen Powerline catheter was tunneled to the left neck dermatotomy site. Cuff was placed underneath the skin. Kumpe catheter was exchanged for the micropuncture dilator set but a 0.18 wire could not be successfully advanced into the SVC. Eventually, the stiff Glidewire was advanced into the SVC and a 20 cm, 4 [place] dilator was advanced over the wire. The stiff Glidewire was removed for a 0.18 wire. The peel-away sheath was placed. The catheter was cut to an appropriate length. Catheter was advanced through the peel-away sheath and coiled in left innominate vein. Catheter was subsequently advanced into the SVC using the stiff Glidewire. Catheter tip was placed at the superior  cavoatrial junction. The catheter aspirated and flushed well. The left neck dermatotomy site was closed using absorbable suture and Dermabond. The catheter was sutured to the left chest with Prolene suture. Fluoroscopic and ultrasound images were taken and saved for documentation. FINDINGS: Tortuous central venous anatomy. Catheter tip at the superior cavoatrial junction. COMPLICATIONS: None IMPRESSION: Successful placement of a left jugular tunneled central venous catheter using ultrasound and fluoroscopic guidance. Electronically Signed   By: Markus Daft M.D.   On: 02/02/2016 17:36  Ir US Guide Vasc Access Left  Result Date: 02/02/2016 INDICATION: 80 year old with bacteremia. Request for placement of a tunneled central venous catheter. EXAM: FLUOROSCOPIC AND ULTRASOUND GUIDED PLACEMENT OF A TUNNELED CENTRAL VENOUS CATHETER Physician: Stephan Minister. Henn, MD FLUOROSCOPY TIME:  8 minutes and 42 seconds, 25.3 mGy MEDICATIONS: 3 mg versed, 100 mcg fentanyl. A radiology nurse monitored the patient for moderate sedation. ANESTHESIA/SEDATION: Moderate sedation time: 45 minutes PROCEDURE: Informed consent was obtained for placement of a tunneled central venous catheter. The patient was placed supine on the interventional table. Ultrasound confirmed a patent left internal jugularvein. Ultrasound images were obtained for documentation. The left side of the neck was prepped and draped in a sterile fashion. The left side of the neck was anesthetized with 1% lidocaine. Maximal barrier sterile technique was utilized including caps, mask, sterile gowns, sterile gloves, sterile drape, hand hygiene and skin antiseptic. A small incision was made with #11 blade scalpel. A 21 gauge needle directed into the left internal jugular vein with ultrasound guidance. Wire repeatedly went up towards the head. Left internal jugular vein was punctured multiple times in different orientations but the wire continued to up towards the head. Eventually,  the left internal jugular vein was punctured with a 19 gauge needle using ultrasound guidance. A Bentson wire was advanced into the left subclavian vein. A 5 [place] Kumpe catheter was directed into the left innominate vein and down the SVC. Small incision was made below the left clavicle. A single lumen Powerline catheter was tunneled to the left neck dermatotomy site. Cuff was placed underneath the skin. Kumpe catheter was exchanged for the micropuncture dilator set but a 0.18 wire could not be successfully advanced  into the SVC. Eventually, the stiff Glidewire was advanced into the SVC and a 20 cm, 4 [place] dilator was advanced over the wire. The stiff Glidewire was removed for a 0.18 wire. The peel-away sheath was placed. The catheter was cut to an appropriate length. Catheter was advanced through the peel-away sheath and coiled in left innominate vein. Catheter was subsequently advanced into the SVC using the stiff Glidewire. Catheter tip was placed at the superior cavoatrial junction. The catheter aspirated and flushed well. The left neck dermatotomy site was closed using absorbable suture and Dermabond. The catheter was sutured to the left chest with Prolene suture. Fluoroscopic and ultrasound images were taken and saved for documentation. FINDINGS: Tortuous central venous anatomy. Catheter tip at the superior cavoatrial junction. COMPLICATIONS: None IMPRESSION: Successful placement of a left jugular tunneled central venous catheter using ultrasound and fluoroscopic guidance. Electronically Signed   By: Markus Daft M.D.   On: 02/02/2016 17:36  Dg Chest Portable 1 View  Result Date: 02/20/2016 CLINICAL DATA:  Stroke. EXAM: PORTABLE CHEST 1 VIEW COMPARISON:  01/28/2016 FINDINGS: Status post median sternotomy. Left-sided PICC line tip overlies the level of the lower superior vena cava. The heart is enlarged. There is streaky atelectasis in the right perihilar region and left lung base. No focal consolidations  or evidence for pulmonary edema. IMPRESSION: Cardiomegaly without pulmonary edema. Bibasilar atelectasis. Electronically Signed   By: Nolon Nations M.D.   On: 02/20/2016 17:43   Dg Chest Portable 1 View  Addendum Date: 02/19/2016   ADDENDUM REPORT: 02/19/2016 14:10 ADDENDUM: The 2 lead pacemaker has been removed. Electronically Signed   By: Dorise Bullion III M.D   On: 02/19/2016 14:10   Result Date: 02/19/2016 CLINICAL DATA:  Right IJ placement EXAM: PORTABLE CHEST 1 VIEW COMPARISON:  January 16, 2016 FINDINGS: A right IJ has been placed in the interval, probably terminating just within the right side of the atrium, approximately 2 cm inferior to the caval atrial junction. No pneumothorax. Opacity remains in the right mid lung, less focal in the interval. Opacity and effusion remains in the left lung base, also less focal in the interval. No other interval changes. IMPRESSION: 1. The distal tip of the new right IJ probably terminates approximately 2 cm below the caval atrial junction, just within the right side of the atrium. Recommend repositioning as clinically warranted. No pneumothorax. 2. The right-sided pulmonary infiltrates are less focal in the interval but remain. There is probably a small amount of layering effusion on the right as well. Electronically Signed: By: Dorise Bullion III M.D On: 01/27/2016 19:55   Dg Chest Port 1 View  Result Date: 01/28/2016 CLINICAL DATA:  Weakness, infected pacemaker EXAM: PORTABLE CHEST 1 VIEW COMPARISON:  Portable chest x-ray of January 27, 2016 FINDINGS: The left lung is well-expanded and clear. On the right there is mild volume loss with increased density in the upper and lower lobes which is stable. There is no pneumothorax. There is biapical pleural thickening the heart is normal in size. There is dense calcification in the aortic arch. The sternal wires are intact. The right internal jugular venous catheter tip projects over the distal third of the SVC just  above the cavoatrial junction. IMPRESSION: 1. Persistent infiltrate in the right upper and lower lobes. Small right pleural effusion. No definite pneumothorax. 2. The left lung is clear.  No CHF.  Aortic atherosclerosis. Electronically Signed   By: David  Martinique M.D.   On: 01/28/2016 07:11   Ct  Head Code Stroke W/o Cm  Result Date: 02/20/2016 CLINICAL DATA:  Code stroke.  Left facial droop.  Blown pupil. EXAM: CT HEAD WITHOUT CONTRAST TECHNIQUE: Contiguous axial images were obtained from the base of the skull through the vertex without intravenous contrast. COMPARISON:  CT head 04/03/2014 FINDINGS: Generalized atrophy. Hypodensity in the periventricular white matter bilaterally similar to the prior study and consistent with chronic microvascular ischemia. Negative for acute infarct. Negative for dense MCA. Atherosclerotic calcification in the vertebral and carotid arteries bilaterally. Negative for hemorrhage or mass. No shift of the midline structures. Negative calvarium. ASPECTS [facility] Stroke Program Early CT Score, [email or web address]) - Ganglionic level infarction (caudate, lentiform nuclei, internal capsule, insula, M1-M3 cortex): 7 - Supraganglionic infarction (M4-M6 cortex): 3 Total score (0-10 with 10 being normal): 10 IMPRESSION: 1. No acute intracranial abnormality. Atrophy and chronic ischemic changes. 2. ASPECTS score 10 These results were called by telephone at the time of interpretation on 02/20/2016 at 2:41 pm to Dr. Tasia Catchings, who verbally acknowledged these results. Electronically Signed   By: Franchot Gallo M.D.   On: 02/20/2016 14:45    Assessment/Plan  Generalized weakness From physical deconditioning. Will have him work with physical therapy and occupational therapy team to help with gait training and muscle strengthening exercises.fall precautions. Skin care. Encourage to be out of bed.   Seizure Remains seizure free. Currently not on any seizure medication,  monitor  Blood loss anemia From hematuria. S/p prbc transfusion, check cbc. continue feso4 325 mg tid and epopoetin every 2 weeks.  Thrombocytopenia No bleed at present. Has MDS. Monitor platelet count.   ckd stage 4 Monitor bmp.   Protein calorie malnutrition Get RD to evaluate, monitor weekly weight. Continue medpass  Hypokalemia On kcl 10 meq daily at present, check bmp  Failure to thrive Has protein calorie malnutrition, multiple medical co-morbidities, severe deconditioning with pressure ulcers and is on antibiotics for bacteremia. Will get palliative care consult to review goals of care.   Staphylococcus bacteremia Patient was to complete his antibiotic course on 02/12/16. Will need his picc line removed by IR, make appointment for this. Pt discharged from hospital on rifampin. Discontinue this.   afib His pacemaker has been removed. Not an anticoagulation candidate. Continue amiodarone 100 mg daily  Bladder hematoma S/p cystoscopy and removal of clot. No hematuria noted at present. Will need urology follow up appointment. Continue foley care. Hydration encouraged. Continue rapaflo.   Hyperlipidemia Continue lipitor 80 mg daily  HTN Monitor bp, continue hydralazine 75 mg qid, coreg 6.25 mg bid and imdur 120 mg daily with lasix 80 mg daily. Check bmp  Constipation Continue senokot s 2 tab bid  Pseudogout On colchicine 0.3 mg daily. Not having any gflare up. Has been discharged from hospital on tapering course of prednisone but no steroid was given in hospital on med review. D/c prednisone for now and monitor  CAD Chest pain free. Continue atorvastatin with coreg and imdur. Not on antiplatelet agent with hx of bleed  Prostate cancer Gets lupron injection, continue rapaflo. Follow with urology  DM type 2 Lab Results  Component Value Date   HGBA1C 4.9 02/20/2016   Monitor cbg, continue SSI novolog.    Goals of care: short term rehabilitation   Labs/tests  ordered: cbc, cmp 03/01/16  Family/ staff Communication: reviewed care plan with patient and nursing supervisor    Blanchie Serve, MD [facility] [address] [place], [place] [address] Cell Phone (Monday-Friday 8  am - 5 pm): [phone] On Call: [phone] and follow prompts after 5 pm and on weekends Office Phone: [phone] Office Fax: [phone]

## 2016-02-26 LAB — CULTURE, BLOOD (ROUTINE X 2)
CULTURE: NO GROWTH
Culture: NO GROWTH

## 2016-03-01 LAB — HEPATIC FUNCTION PANEL
ALT: 7 U/L — AB (ref 10–40)
AST: 25 U/L (ref 14–40)
Alkaline Phosphatase: 43 U/L (ref 25–125)
BILIRUBIN, TOTAL: 0.3 mg/dL

## 2016-03-01 LAB — CBC AND DIFFERENTIAL
HCT: 30 % — AB (ref 41–53)
Hemoglobin: 9.5 g/dL — AB (ref 13.5–17.5)
Neutrophils Absolute: 4 /uL
Platelets: 179 10*3/uL (ref 150–399)
WBC: 5.6 10*3/mL

## 2016-03-01 LAB — BASIC METABOLIC PANEL
BUN: 22 mg/dL — AB (ref 4–21)
Creatinine: 1.5 mg/dL — AB (ref 0.6–1.3)
GLUCOSE: 104 mg/dL
POTASSIUM: 3.3 mmol/L — AB (ref 3.4–5.3)
SODIUM: 139 mmol/L (ref 137–147)

## 2016-03-01 LAB — ACID FAST CULTURE WITH REFLEXED SENSITIVITIES (MYCOBACTERIA): Acid Fast Culture: NEGATIVE

## 2016-03-05 LAB — ACID FAST CULTURE WITH REFLEXED SENSITIVITIES (MYCOBACTERIA): Acid Fast Culture: NEGATIVE

## 2016-03-06 LAB — BASIC METABOLIC PANEL
BUN: 19 mg/dL (ref 4–21)
Creatinine: 1.4 mg/dL — AB (ref 0.6–1.3)
Glucose: 101 mg/dL
POTASSIUM: 3 mmol/L — AB (ref 3.4–5.3)
SODIUM: 139 mmol/L (ref 137–147)

## 2016-03-08 ENCOUNTER — Other Ambulatory Visit (HOSPITAL_BASED_OUTPATIENT_CLINIC_OR_DEPARTMENT_OTHER): Payer: Medicare Other

## 2016-03-08 ENCOUNTER — Encounter: Payer: Self-pay | Admitting: Hematology and Oncology

## 2016-03-08 ENCOUNTER — Ambulatory Visit: Payer: Medicare Other

## 2016-03-08 ENCOUNTER — Ambulatory Visit (HOSPITAL_BASED_OUTPATIENT_CLINIC_OR_DEPARTMENT_OTHER): Payer: Medicare Other | Admitting: Hematology and Oncology

## 2016-03-08 ENCOUNTER — Telehealth: Payer: Self-pay | Admitting: *Deleted

## 2016-03-08 ENCOUNTER — Inpatient Hospital Stay (HOSPITAL_COMMUNITY): Admission: RE | Admit: 2016-03-08 | Payer: Medicare Other | Source: Ambulatory Visit

## 2016-03-08 VITALS — BP 177/78 | HR 86 | Temp 98.2°F | Resp 19

## 2016-03-08 DIAGNOSIS — I10 Essential (primary) hypertension: Secondary | ICD-10-CM | POA: Diagnosis not present

## 2016-03-08 DIAGNOSIS — R319 Hematuria, unspecified: Secondary | ICD-10-CM

## 2016-03-08 DIAGNOSIS — N183 Chronic kidney disease, stage 3 unspecified: Secondary | ICD-10-CM

## 2016-03-08 DIAGNOSIS — Z8546 Personal history of malignant neoplasm of prostate: Secondary | ICD-10-CM | POA: Diagnosis not present

## 2016-03-08 DIAGNOSIS — D469 Myelodysplastic syndrome, unspecified: Secondary | ICD-10-CM | POA: Diagnosis not present

## 2016-03-08 DIAGNOSIS — R569 Unspecified convulsions: Secondary | ICD-10-CM

## 2016-03-08 LAB — CBC & DIFF AND RETIC
BASO%: 0.3 % (ref 0.0–2.0)
BASOS ABS: 0 10*3/uL (ref 0.0–0.1)
EOS%: 0.3 % (ref 0.0–7.0)
Eosinophils Absolute: 0 10*3/uL (ref 0.0–0.5)
HEMATOCRIT: 28.5 % — AB (ref 38.4–49.9)
HGB: 9.1 g/dL — ABNORMAL LOW (ref 13.0–17.1)
Immature Retic Fract: 7.8 % (ref 3.00–10.60)
LYMPH#: 1.1 10*3/uL (ref 0.9–3.3)
LYMPH%: 14.8 % (ref 14.0–49.0)
MCH: 29.7 pg (ref 27.2–33.4)
MCHC: 31.9 g/dL — ABNORMAL LOW (ref 32.0–36.0)
MCV: 93.1 fL (ref 79.3–98.0)
MONO#: 0.6 10*3/uL (ref 0.1–0.9)
MONO%: 7.6 % (ref 0.0–14.0)
NEUT#: 5.7 10*3/uL (ref 1.5–6.5)
NEUT%: 77 % — ABNORMAL HIGH (ref 39.0–75.0)
Platelets: 179 10*3/uL (ref 140–400)
RBC: 3.06 10*6/uL — ABNORMAL LOW (ref 4.20–5.82)
RDW: 23.8 % — AB (ref 11.0–14.6)
RETIC %: 0.56 % — AB (ref 0.80–1.80)
RETIC CT ABS: 17.14 10*3/uL — AB (ref 34.80–93.90)
WBC: 7.4 10*3/uL (ref 4.0–10.3)

## 2016-03-08 LAB — BASIC METABOLIC PANEL
BUN: 17 mg/dL (ref 4–21)
CREATININE: 1.2 mg/dL (ref 0.6–1.3)
GLUCOSE: 85 mg/dL
POTASSIUM: 3.6 mmol/L (ref 3.4–5.3)
Sodium: 142 mmol/L (ref 137–147)

## 2016-03-08 MED ORDER — DARBEPOETIN ALFA 200 MCG/0.4ML IJ SOSY
200.0000 ug | PREFILLED_SYRINGE | Freq: Once | INTRAMUSCULAR | Status: AC
  Administered 2016-03-08: 200 ug via SUBCUTANEOUS
  Filled 2016-03-08: qty 0.4

## 2016-03-08 NOTE — Progress Notes (Signed)
Aranesp  Injection given by Lacinda Axon

## 2016-03-08 NOTE — Telephone Encounter (Signed)
Received VM from a woman's voice at 10:24 am.   She says she didn't know pt had appt this morning and he does not have transportation.  She can arrange transportation for him today if he can come in later.  Ok w/ Dr. Alvy Bimler for pt to come in later today.  He can have labs, see MD and get injection but may not have time today for blood transfusion if needed today.  Tried to call back woman x 3 at phone number she left [phone]) but there is no answer and VM is full.  I called back to pt's home number [phone]) and left VM informing pt can come in today as soon as he can and to call nurse back to let us know when he is coming.

## 2016-03-08 NOTE — Telephone Encounter (Signed)
Tanya from [facility] called back at noon and said they can arrange transportation for pt to be here around 1 pm.  Informed this is ok , but if pt needs transfusion then we may need to reschedule that to another day.  She verbalized understanding.

## 2016-03-09 DIAGNOSIS — N183 Chronic kidney disease, stage 3 unspecified: Secondary | ICD-10-CM | POA: Insufficient documentation

## 2016-03-09 NOTE — Assessment & Plan Note (Signed)
He tolerated darbepoetin injection well for long time until recent hospitalization. I recommend resuming treatment today and his appointment to every 3 weeks

## 2016-03-09 NOTE — Assessment & Plan Note (Signed)
The patient had recent seizure disorder. Will defer to neurology workup

## 2016-03-09 NOTE — Progress Notes (Signed)
[facility] OFFICE PROGRESS NOTE  Patient Care Team: Hulan Fess, MD as PCP - General (Family Medicine) Belva Crome, MD (Cardiology) Laurence Spates, MD (Gastroenterology) Heath Lark, MD as Consulting Physician (Hematology and Oncology) Patsey Berthold, NP as Nurse Practitioner (Cardiology) Cleon Gustin, MD as Consulting Physician (Urology) Greer Pickerel, MD as Consulting Physician (General Surgery)  SUMMARY OF ONCOLOGIC HISTORY:  DIAGNOSIS: Low-grade myelodysplastic syndrome, history prostate cancer, on erythropoietin stimulating agents for chronic anemia  SUMMARY OF ONCOLOGIC HISTORY: This is a very interesting gentleman with a history of prostate cancer diagnosed in 1997, status post radiation and seed implantation and Lupron injection. The patient cannot recall the stage of his disease. He's been getting Lupron injection every 4 months. He does not recall his last PSA number but was never told he require urgent attention of further treatment apart from just getting Lupron injection. He was also found to have chronic anemia in the past and has been placed on oral ion supplements 4 years of which he took twice a day. His last ferritin level was over 1300. The patient also has extensive EGD and colonoscopy done which showed no evidence of active bleeding. He had B12 level checked recently and was found to have B12 deficiency and was started on B12 injections. The patient also has significant anemia requiring hospitalization and recent blood transfusion. On 05/10/2013 he had bone marrow aspirate and biopsy which is consistent with myelodysplastic syndrome. On 06/22/2013: We started him on erythropoietin stimulating agents for anemia. In November 2016, the patient self discontinued Procrit injection due to loss of insurance On 09/08/2015, I resumed Aranesp treatment with 200 g every 2 week to keep hemoglobin greater than 10 g The patient recently had recurrent admissions for  many reasons. His most recent admission was related to new seizure. His currently residing in a skilled facility  INTERVAL HISTORY: Please see below for problem oriented charting. He returns today. He complained of profound fatigue.He denies chest pain or shortness of breath. He denies recent infection. The patient denies any recent signs or symptoms of bleeding such as spontaneous epistaxis or hematochezia. He complained of weakness. No recent seizure. He complained of intermittent hematuria. He has Foley catheter placement  REVIEW OF SYSTEMS:   Constitutional: Denies fevers, chills or abnormal weight loss Eyes: Denies blurriness of vision Ears, nose, mouth, throat, and face: Denies mucositis or sore throat Respiratory: Denies cough, dyspnea or wheezes Cardiovascular: Denies palpitation, chest discomfort or lower extremity swelling Gastrointestinal:  Denies nausea, heartburn or change in bowel habits Skin: Denies abnormal skin rashes Lymphatics: Denies new lymphadenopathy or easy bruising Behavioral/Psych: Mood is stable, no new changes  All other systems were reviewed with the patient and are negative.  I have reviewed the past medical history, past surgical history, social history and family history with the patient and they are unchanged from previous note.  ALLERGIES:  is allergic to nsaids; ibuprofen; and ace inhibitors.  MEDICATIONS:  Current Outpatient Prescriptions  Medication Sig Dispense Refill  . acetaminophen (ARTHRITIS PAIN RELIEF) 650 MG CR tablet Take 650 mg by mouth 3 (three) times daily.     Marland Kitchen amiodarone (PACERONE) 200 MG tablet Take one-half tablet by  mouth daily 45 tablet 2  . atorvastatin (LIPITOR) 80 MG tablet Take 80 mg by mouth at bedtime.     . Calcifediol ER (RAYALDEE) 30 MCG CPCR Take 30 mcg by mouth at bedtime.    . carvedilol (COREG) 6.25 MG tablet Take 1 tablet (6.25  mg total) by mouth 2 (two) times daily with a meal.    . colchicine 0.6 MG tablet Take  0.5 tablets (0.3 mg total) by mouth daily.    . cyanocobalamin (,VITAMIN B-12,) 1000 MCG/ML injection Inject 1,000 mcg into the muscle every 30 (thirty) days. Vitamin B12 - last injection 09/01/15    . epoetin alfa (EPOGEN,PROCRIT) 16109 UNIT/ML injection Inject 10,000 Units into the skin every 14 (fourteen) days. Done at [facility] - last injection 09/22/15    . ferrous sulfate 325 (65 FE) MG tablet Take 1 tablet (325 mg total) by mouth 3 (three) times daily with meals.  3  . furosemide (LASIX) 80 MG tablet Take 1 tablet (80 mg total) by mouth daily. 30 tablet   . hydrALAZINE (APRESOLINE) 25 MG tablet Take 3 tablets (75 mg total) by mouth 4 (four) times daily.    . insulin aspart (NOVOLOG) 100 UNIT/ML injection Inject 0-9 Units into the skin 3 (three) times daily with meals. 10 mL 11  . isosorbide mononitrate (IMDUR) 120 MG 24 hr tablet Take 1 tablet by mouth  daily 90 tablet 2  . leuprolide (LUPRON) 30 MG injection Inject 30 mg into the muscle every 4 (four) months.    . Multiple Vitamin (MULTIVITAMIN WITH MINERALS) TABS tablet Take 1 tablet by mouth every morning.    . pantoprazole (PROTONIX) 40 MG tablet Take 1 tablet by mouth daily.    . potassium chloride (K-DUR) 10 MEQ tablet Take 1 tablet (10 mEq total) by mouth daily.    . predniSONE (DELTASONE) 10 MG tablet Please use 40 mg oral daily for 3 days, then 30 mg oral daily for 3 days, then 20 mg oral daily for 3 days, then 10 mg oral daily for 3 days    . rifampin (RIFADIN) 300 MG capsule Take 600 mg by mouth 2 (two) times daily.    Marland Kitchen senna-docusate (SENOKOT-S) 8.6-50 MG tablet Take 2 tablets by mouth 2 (two) times daily.    . silodosin (RAPAFLO) 8 MG CAPS capsule Take 8 mg by mouth at bedtime.     . sodium bicarbonate 650 MG tablet Take 1 tablet (650 mg total) by mouth 2 (two) times daily.    Marland Kitchen UNABLE TO FIND Med Name: Med pass 120 mL 3 times daily     No current facility-administered medications for this visit.     PHYSICAL  EXAMINATION: ECOG PERFORMANCE STATUS: 2 - Symptomatic, <50% confined to bed  Vitals:   03/08/16 1405  BP: (!) 177/78  Pulse: 86  Resp: 19  Temp: 98.2 F (36.8 C)   Filed Weights    GENERAL:alert, no distress and comfortable SKIN: skin color, texture, turgor are normal, no rashes or significant lesions EYES: normal, Conjunctiva are pink and non-injected, sclera clear OROPHARYNX:no exudate, no erythema and lips, buccal mucosa, and tongue normal  NECK: supple, thyroid normal size, non-tender, without nodularity LYMPH:  no palpable lymphadenopathy in the cervical, axillary or inguinal LUNGS: clear to auscultation and percussion with normal breathing effort HEART: regular rate & rhythm and no murmurs with moderate lower extremity edema ABDOMEN:abdomen soft, non-tender and normal bowel sounds Musculoskeletal:no cyanosis of digits and no clubbing  NEURO: alert & oriented x 3 with fluent speech, no focal motor/sensory deficits  LABORATORY DATA:  I have reviewed the data as listed    Component Value Date/Time   NA 138 02/24/2016 0400   NA 146 (H) 08/10/2013 1324   K 3.4 (L) 02/24/2016 0400  K 3.9 08/10/2013 1324   CL 110 02/24/2016 0400   CO2 22 02/24/2016 0400   CO2 27 08/10/2013 1324   GLUCOSE 115 (H) 02/24/2016 0400   GLUCOSE 105 08/10/2013 1324   BUN 31 (H) 02/24/2016 0400   BUN 40.3 (H) 08/10/2013 1324   CREATININE 1.96 (H) 02/24/2016 0400   CREATININE 5.79 (H) 11/07/2015 1440   CREATININE 2.6 (H) 08/10/2013 1324   CALCIUM 7.7 (L) 02/24/2016 0400   CALCIUM 9.4 08/10/2013 1324   PROT 5.1 (L) 02/21/2016 0627   PROT 7.0 08/10/2013 1324   ALBUMIN 2.1 (L) 02/21/2016 0627   ALBUMIN 3.8 08/10/2013 1324   AST 21 02/21/2016 0627   AST 14 08/10/2013 1324   ALT <5 (L) 02/21/2016 0627   ALT 8 08/10/2013 1324   ALKPHOS 37 (L) 02/21/2016 0627   ALKPHOS 47 08/10/2013 1324   BILITOT 0.7 02/21/2016 0627   BILITOT 0.33 08/10/2013 1324   GFRNONAA 31 (L) 02/24/2016 0400   GFRAA 36  (L) 02/24/2016 0400    No results found for: SPEP, UPEP  Lab Results  Component Value Date   WBC 7.4 03/08/2016   NEUTROABS 5.7 03/08/2016   HGB 9.1 (L) 03/08/2016   HCT 28.5 (L) 03/08/2016   MCV 93.1 03/08/2016   PLT 179 03/08/2016      Chemistry      Component Value Date/Time   NA 138 02/24/2016 0400   NA 146 (H) 08/10/2013 1324   K 3.4 (L) 02/24/2016 0400   K 3.9 08/10/2013 1324   CL 110 02/24/2016 0400   CO2 22 02/24/2016 0400   CO2 27 08/10/2013 1324   BUN 31 (H) 02/24/2016 0400   BUN 40.3 (H) 08/10/2013 1324   CREATININE 1.96 (H) 02/24/2016 0400   CREATININE 5.79 (H) 11/07/2015 1440   CREATININE 2.6 (H) 08/10/2013 1324      Component Value Date/Time   CALCIUM 7.7 (L) 02/24/2016 0400   CALCIUM 9.4 08/10/2013 1324   ALKPHOS 37 (L) 02/21/2016 0627   ALKPHOS 47 08/10/2013 1324   AST 21 02/21/2016 0627   AST 14 08/10/2013 1324   ALT <5 (L) 02/21/2016 0627   ALT 8 08/10/2013 1324   BILITOT 0.7 02/21/2016 0627   BILITOT 0.33 08/10/2013 1324      ASSESSMENT & PLAN:  MDS (myelodysplastic syndrome) (HCC) He tolerated darbepoetin injection well for long time until recent hospitalization. I recommend resuming treatment today and his appointment to every 3 weeks  Essential hypertension The patient has poorly controlled hypertension. He is aware, he would not get his injection if systolic blood pressures greater than 160. he will continue current medical management. I recommend close follow-up with primary care doctor for medication adjustment.   Chronic kidney disease, stage III (moderate) he will continue current medical management. I recommend close follow-up with primary care doctor for medication adjustment.   Seizure [facility]) The patient had recent seizure disorder. Will defer to neurology workup  Hematuria He had recent hematuria likely due to Foley catheter placement. Hopefully it will resolve once It is removed   Orders Placed This Encounter   Procedures  . Ferritin    Standing Status:   Future    Standing Expiration Date:   04/13/2017  . Vitamin B12    Standing Status:   Future    Standing Expiration Date:   04/13/2017  . Iron and TIBC    Standing Status:   Future    Standing Expiration Date:   04/13/2017  . Erythropoietin  Standing Status:   Future    Standing Expiration Date:   04/13/2017   All questions were answered. The patient knows to call the clinic with any problems, questions or concerns. No barriers to learning was detected. I spent 20 minutes counseling the patient face to face. The total time spent in the appointment was 25 minutes and more than 50% was on counseling and review of test results     [facility], [place], MD 03/09/2016 7:44 AM

## 2016-03-09 NOTE — Assessment & Plan Note (Signed)
he will continue current medical management. I recommend close follow-up with primary care doctor for medication adjustment.  

## 2016-03-09 NOTE — Assessment & Plan Note (Signed)
The patient has poorly controlled hypertension. He is aware, he would not get his injection if systolic blood pressures greater than 160. he will continue current medical management. I recommend close follow-up with primary care doctor for medication adjustment.

## 2016-03-09 NOTE — Assessment & Plan Note (Signed)
He had recent hematuria likely due to Foley catheter placement. Hopefully it will resolve once It is removed

## 2016-03-11 ENCOUNTER — Encounter (HOSPITAL_BASED_OUTPATIENT_CLINIC_OR_DEPARTMENT_OTHER): Payer: Medicare Other | Attending: Internal Medicine

## 2016-03-11 DIAGNOSIS — D469 Myelodysplastic syndrome, unspecified: Secondary | ICD-10-CM | POA: Diagnosis not present

## 2016-03-11 DIAGNOSIS — C61 Malignant neoplasm of prostate: Secondary | ICD-10-CM | POA: Diagnosis not present

## 2016-03-11 DIAGNOSIS — Y842 Radiological procedure and radiotherapy as the cause of abnormal reaction of the patient, or of later complication, without mention of misadventure at the time of the procedure: Secondary | ICD-10-CM | POA: Insufficient documentation

## 2016-03-11 DIAGNOSIS — N3041 Irradiation cystitis with hematuria: Secondary | ICD-10-CM | POA: Insufficient documentation

## 2016-03-12 ENCOUNTER — Telehealth: Payer: Self-pay | Admitting: Hematology and Oncology

## 2016-03-12 DIAGNOSIS — A0472 Enterocolitis due to Clostridium difficile, not specified as recurrent: Secondary | ICD-10-CM

## 2016-03-12 HISTORY — DX: Enterocolitis due to Clostridium difficile, not specified as recurrent: A04.72

## 2016-03-12 NOTE — Telephone Encounter (Signed)
Patient wife stopped by for scehduled. Gave wife schedule for September thru March. Appointments complete per 8/28 los.

## 2016-03-17 LAB — CBC AND DIFFERENTIAL
HCT: 28 % — AB (ref 41–53)
Hemoglobin: 8.9 g/dL — AB (ref 13.5–17.5)
Neutrophils Absolute: 16 /uL
Platelets: 178 10*3/uL (ref 150–399)
WBC: 19.2 10*3/mL

## 2016-03-17 LAB — BASIC METABOLIC PANEL
BUN: 16 mg/dL (ref 4–21)
CREATININE: 1.4 mg/dL — AB (ref 0.6–1.3)
GLUCOSE: 80 mg/dL
POTASSIUM: 3 mmol/L — AB (ref 3.4–5.3)
Sodium: 141 mmol/L (ref 137–147)

## 2016-03-18 ENCOUNTER — Non-Acute Institutional Stay (SKILLED_NURSING_FACILITY): Payer: Medicare Other | Admitting: Adult Health

## 2016-03-18 ENCOUNTER — Encounter: Payer: Self-pay | Admitting: Adult Health

## 2016-03-18 DIAGNOSIS — D72829 Elevated white blood cell count, unspecified: Secondary | ICD-10-CM | POA: Diagnosis not present

## 2016-03-18 DIAGNOSIS — E876 Hypokalemia: Secondary | ICD-10-CM | POA: Diagnosis not present

## 2016-03-18 NOTE — Progress Notes (Addendum)
Patient ID: [name] Brow., male   DOB: [birth year], 80 y.o.   MRN: [id number]    DATE:  03/18/16  MRN:  [id number]  BIRTHDAY: [birth year]  Facility:  Nursing Home Location:  [place] [facility] [place] Room Number: 106-P  LEVEL OF CARE:  SNF (31)  Contact Information    Name Relation Home Work [place] Son [phone]  [phone]   [place]end[place] Milo Daughter [phone]  [phone]       Code Status History    Date Active Date Inactive Code Status Order ID Comments User Context   02/20/2016  5:09 PM 02/23/2016  1:09 PM DNR [id number]  Rondel Jumbo, PA-C ED   01/29/2016  5:34 PM 02/03/2016  6:31 PM DNR [id number]  Micheline Rough, MD Inpatient   01/27/2016  9:30 PM 01/29/2016  5:34 PM Full Code [id number]  [place] Lance, MD Inpatient   01/16/2016  5:14 PM 01/27/2016  9:30 PM Full Code [id number]  Bonnell Public, MD Inpatient   08/30/2015 12:33 AM 08/31/2015  5:14 PM Full Code [id number]  Edwin Dada, MD Inpatient   06/05/2013  4:19 AM 06/14/2013  5:41 PM Full Code [id number]  Charlton Haws, MD Inpatient   03/16/2013  9:54 PM 03/20/2013  6:18 PM Full Code [id number]  Theressa Millard, MD ED   01/06/2012 11:43 AM 01/09/2012  2:51 PM Full Code [id number]  Orvan July, RN Inpatient    Questions for Most Recent Historical Code Status (Order [id number])    Question Answer Comment   In the event of cardiac or respiratory ARREST Do not call a "code blue"    In the event of cardiac or respiratory ARREST Do not perform Intubation, CPR, defibrillation or ACLS    In the event of cardiac or respiratory ARREST Use medication by any route, position, wound care, and other measures to relive pain and suffering. May use oxygen, suction and manual treatment of airway obstruction as needed for comfort.         Advance Directive Documentation   Flowsheet Row Most Recent Value  Type of Advance Directive  Out of facility DNR (pink  MOST or yellow form)  Pre-existing out of facility DNR order (yellow form or pink MOST form)  No data  "MOST" Form in Place?  No data       Chief Complaint  Patient presents with  . Acute Visit    Hypokalemia, leukocytosis    HISTORY OF PRESENT ILLNESS:  This is a 80 year old male who is currently having a short-term rehabilitation @ [place]. He was noted to have K 3.0 and wbc 19.2. He verbalized feeling weak and sick. No cough has been noted.  He has been admitted to [facility] on 02/24/16 from [facility] with new onset of seizure and acute blood loss anemia from hematuria (Hgb 3.4). He was transfused PRBC and had urology and neurology consult. CT brain and EEG were unrevealing. He had cystoscopy with clot evacuation/fulgeration on 02/22/16. His electrolytes were repleated and his Clonidine was discontinued. Coreg dosage was decreased due to hypotension.   PAST MEDICAL HISTORY:  Past Medical History:  Diagnosis Date  . Angiomyolipoma 2009   On both kidneys noted in 2009  . Arthritis    neck and left wrist  . Atrial fibrillation ([place])   . CAD (coronary artery disease)    s/b CABG 1994, and subsequent stents. Repeat CABG 12/2011,  .  Chronic diastolic heart failure ([place])   . CKD (chronic kidney disease)   . Clostridium difficile diarrhea 03/2016  . Depressive disorder   . Diabetes mellitus type 2 with peripheral artery disease (HCC)    DIET CONTROLLED  . DVT (deep venous thrombosis) ([place]) 2011   Right arm  . Gastroparesis   . GERD (gastroesophageal reflux disease)   . Gout   . History of hiatal hernia   . Hyperlipidemia   . Hypertension   . Internal hemorrhoids without mention of complication   . Ischemic colitis ([place])   . 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 Myelodysplastic syndrome ([place]) 05/22/2013   With low hemoglobin and platelets treated with Procrit  . Osteopenia   . Peptic ulcer    S/p partial gastrectomy in 1969  . Peripheral artery disease ([place])   .  Pneumonia 01/16/2016  . Pneumonia 03/2016  . Presence of permanent cardiac pacemaker   . Prostate cancer ([place]) 1997   XRT and lupron  . Renal artery stenosis ([place])   . Sick sinus syndrome ([place])   . Vitamin B 12 deficiency      CURRENT MEDICATIONS: Reviewed  Patient's Medications  New Prescriptions   No medications on file  Previous Medications   ACETAMINOPHEN (ARTHRITIS PAIN RELIEF) 650 MG CR TABLET    Take 650 mg by mouth 3 (three) times daily.    AMIODARONE (PACERONE) 200 MG TABLET    Take one-half tablet by  mouth daily   ATORVASTATIN (LIPITOR) 80 MG TABLET    Take 80 mg by mouth at bedtime.    CALCIFEDIOL ER (RAYALDEE) 30 MCG CPCR    Take 30 mcg by mouth at bedtime.   CARVEDILOL (COREG) 6.25 MG TABLET    Take 1 tablet (6.25 mg total) by mouth 2 (two) times daily with a meal.   COLCHICINE 0.6 MG TABLET    Take 0.5 tablets (0.3 mg total) by mouth daily.   CYANOCOBALAMIN (,VITAMIN B-12,) 1000 MCG/ML INJECTION    Inject 1,000 mcg into the muscle every 30 (thirty) days. Vitamin B12 - last injection 09/01/15   EPOETIN ALFA (EPOGEN,PROCRIT) 60454 UNIT/ML INJECTION    Inject 10,000 Units into the skin every 14 (fourteen) days. Done at [facility] cancer center - last injection 09/22/15   FERROUS SULFATE 325 (65 FE) MG TABLET    Take 1 tablet (325 mg total) by mouth 3 (three) times daily with meals.   FUROSEMIDE (LASIX) 80 MG TABLET    Take 1 tablet (80 mg total) by mouth daily.   HYDRALAZINE (APRESOLINE) 25 MG TABLET    Take 3 tablets (75 mg total) by mouth 4 (four) times daily.   INSULIN ASPART (NOVOLOG) 100 UNIT/ML INJECTION    Inject 0-9 Units into the skin. SSI:  121-150 = 1 unit, 151-200 = 2 units, 201-250 = 3 units, 251-300 = 5 units, 301-350 = 7 units, 351-400 = 9 units   ISOSORBIDE MONONITRATE (IMDUR) 120 MG 24 HR TABLET    Take 1 tablet by mouth  daily   LEUPROLIDE (LUPRON) 30 MG INJECTION    Inject 30 mg into the muscle every 4 (four) months.   LEVOFLOXACIN (LEVAQUIN) 500 MG TABLET    Take  500 mg by mouth daily.   METRONIDAZOLE (FLAGYL) 500 MG TABLET    Take 500 mg by mouth 3 (three) times daily.   MULTIPLE VITAMIN (MULTIVITAMIN WITH MINERALS) TABS TABLET    Take 1 tablet by mouth every morning.   PANTOPRAZOLE (PROTONIX) 40 MG  TABLET    Take 1 tablet by mouth daily.    POTASSIUM CHLORIDE SA (K-DUR,KLOR-CON) 20 MEQ TABLET    Take 40 mEq by mouth 2 (two) times daily.   SACCHAROMYCES BOULARDII (FLORASTOR) 250 MG CAPSULE    Take 250 mg by mouth 2 (two) times daily.   SENNA-DOCUSATE (SENOKOT-S) 8.6-50 MG TABLET    Take 2 tablets by mouth 2 (two) times daily.   SILODOSIN (RAPAFLO) 8 MG CAPS CAPSULE    Take 8 mg by mouth at bedtime.    SODIUM BICARBONATE 650 MG TABLET    Take 1 tablet (650 mg total) by mouth 2 (two) times daily.   UNABLE TO FIND    Med Name: Med pass 120 mL 2 times daily  Modified Medications   No medications on file  Discontinued Medications   INSULIN ASPART (NOVOLOG) 100 UNIT/ML INJECTION    Inject 0-9 Units into the skin 3 (three) times daily with meals.   POTASSIUM CHLORIDE (K-DUR) 10 MEQ TABLET    Take 1 tablet (10 mEq total) by mouth daily.   PREDNISONE (DELTASONE) 10 MG TABLET    Please use 40 mg oral daily for 3 days, then 30 mg oral daily for 3 days, then 20 mg oral daily for 3 days, then 10 mg oral daily for 3 days   RIFAMPIN (RIFADIN) 300 MG CAPSULE    Take 600 mg by mouth 2 (two) times daily.     Allergies  Allergen Reactions  . Nsaids Nausea Only    GI issue  . Ibuprofen Other (See Comments)    GI Issues  . Ace Inhibitors Cough     REVIEW OF SYSTEMS:  GENERAL: no change in appetite, no fatigue, no weight changes, no fever, +weakness EYES: Denies change in vision, dry eyes, eye pain, itching or discharge EARS: Denies change in hearing, ringing in ears, or earache NOSE: Denies nasal congestion or epistaxis MOUTH and THROAT: Denies oral discomfort, gingival pain or bleeding, pain from teeth or hoarseness   RESPIRATORY: no cough, SOB, DOE,  wheezing, hemoptysis CARDIAC: no chest pain, edema or palpitations GI: no abdominal pain, diarrhea, constipation, heart burn, nausea or vomiting GU: Denies dysuria, frequency, hematuria, incontinence, or discharge PSYCHIATRIC: Denies feeling of depression or anxiety. No report of hallucinations, insomnia, paranoia, or agitation    PHYSICAL EXAMINATION  GENERAL APPEARANCE: Well nourished. In no acute distress. Normal body habitus SKIN:  Skin is warm and dry. There are no suspicious lesions or rash HEAD: Normal in size and contour. No evidence of trauma EYES: Lids open and close normally. No blepharitis, entropion or ectropion. PERRL. Conjunctivae are clear and sclerae are white. Lenses are without opacity EARS: Pinnae are normal. Patient hears normal voice tunes of the examiner MOUTH and THROAT: Lips are without lesions. Oral mucosa is moist and without lesions. Tongue is normal in shape, size, and color and without lesions NECK: supple, trachea midline, no neck masses, no thyroid tenderness, no thyromegaly LYMPHATICS: no LAN in the neck, no supraclavicular LAN RESPIRATORY: breathing is even & unlabored, BS CTAB; Left chest SL PICC CARDIAC: RRR, no murmur,no extra heart sounds, no edema GI: abdomen soft, normal BS, no masses, no tenderness, no hepatomegaly, no splenomegaly GU:  Has foley catheter draining blood tinge urine EXTREMITIES:  Able to move X 4 extremities PSYCHIATRIC: Alert and oriented X 3. Affect and behavior are appropriate  LABS/RADIOLOGY: Labs reviewed: Basic Metabolic Panel:  Recent Labs  02/01/16 0450 02/02/16 0553 02/02/16 0554 02/03/16 0601  02/21/16  [id number] 02/23/16 0500 02/24/16 0400  03/08/16 1457 03/17/16 1415  NA 139 139  --  139  < > 144 142 138  < > 142 141  K 3.3* 3.3*  --  3.8  < > 3.8 3.2* 3.4*  < > 3.6 3.0*  CL 110 109  --  109  < > 110 113* 110  --   --   --   CO2 21* 23  --  22  < > 23 21* 22  --   --   --   GLUCOSE 97 145*  --  118*  < > 113*  115* 115*  --   --   --   BUN 89* 82*  --  76*  < > 32* 29* 31*  < > 17 16  CREATININE 4.04* 3.73*  --  3.61*  < > 2.05* 2.00* 1.96*  < > 1.2 1.4*  CALCIUM 8.2* 8.1*  --  8.2*  < > 7.7* 7.7* 7.7*  --   --   --   MG  --   --  1.8  --   --   --   --   --   --   --   --   PHOS 3.9 3.5  --  4.1  --   --   --   --   --   --   --   < > = values in this interval not displayed. Liver Function Tests:  Recent Labs  01/16/16 1239  02/03/16 0601 02/20/16 1430 02/21/16 0627 03/01/16 1357  AST 30  --   --  27 21 25   ALT 12*  --   --  6* <5* 7*  ALKPHOS 27*  --   --  44 37* 43  BILITOT 0.7  --   --  0.2* 0.7  --   PROT 7.6  --   --  5.4* 5.1*  --   ALBUMIN 4.2  < > 1.9* 2.2* 2.1*  --   < > = values in this interval not displayed.  CBC:  Recent Labs  02/23/16 0500 02/24/16 0400 03/01/16 1357 03/08/16 1346 03/17/16 1415  WBC 9.8 8.7 5.6 7.4 19.2  NEUTROABS  --   --  4 5.7 16  HGB 8.2* 7.6* 9.5* 9.1* 8.9*  HCT 25.1* 23.2* 30* 28.5* 28*  MCV 95.8 97.1  --  93.1  --   PLT 107* 100* 179 179 178   Cardiac Enzymes:  Recent Labs  01/25/16 1519 02/20/16 1816 02/21/16 0627  CKTOTAL 60  --   --   TROPONINI  --  0.04* 0.05*   CBG:  Recent Labs  02/23/16 2158 02/24/16 0747 02/24/16 1620  GLUCAP 188* 114* 195*      ASSESSMENT/PLAN:  Hypokalemia - increase KCL ER 20 meq give 2 tabs = 40 meq PO TID; BMP on 03/19/16  Leukocytosis - wbc 19.2; no fever nor cough; do CXR and UA w/ CS      Durenda Age, NP [employer] [phone]

## 2016-03-19 ENCOUNTER — Non-Acute Institutional Stay (SKILLED_NURSING_FACILITY): Payer: Medicare Other | Admitting: Adult Health

## 2016-03-19 ENCOUNTER — Encounter: Payer: Self-pay | Admitting: Adult Health

## 2016-03-19 DIAGNOSIS — E876 Hypokalemia: Secondary | ICD-10-CM

## 2016-03-19 DIAGNOSIS — J189 Pneumonia, unspecified organism: Secondary | ICD-10-CM

## 2016-03-19 DIAGNOSIS — A047 Enterocolitis due to Clostridium difficile: Secondary | ICD-10-CM

## 2016-03-19 DIAGNOSIS — A0472 Enterocolitis due to Clostridium difficile, not specified as recurrent: Secondary | ICD-10-CM

## 2016-03-19 LAB — BASIC METABOLIC PANEL
BUN: 21 mg/dL (ref 4–21)
CREATININE: 1.7 mg/dL — AB (ref 0.6–1.3)
Glucose: 103 mg/dL
Potassium: 3.7 mmol/L (ref 3.4–5.3)
Sodium: 142 mmol/L (ref 137–147)

## 2016-03-19 NOTE — Progress Notes (Signed)
Patient ID: [name]., male   DOB: 15-Jan-1936, 80 y.o.   MRN: [id number]    DATE:   03/19/16   MRN:  [id number]  BIRTHDAY: 1936-06-25  Facility:  Nursing Home Location:  [place] [facility] [place] Room Number: 106-P  LEVEL OF CARE:  SNF (31)  Contact Information    Name Relation Home Work [place] Raymond Son [phone]  [phone]   [place] Milo Daughter [phone]  [phone]       Code Status History    Date Active Date Inactive Code Status Order ID Comments User Context   02/20/2016  5:09 PM 02/23/2016  1:09 PM DNR [id number]  Rondel Jumbo, PA-C ED   01/29/2016  5:34 PM 02/03/2016  6:31 PM DNR [id number]  Micheline Rough, MD Inpatient   01/27/2016  9:30 PM 01/29/2016  5:34 PM Full Code [id number]  [place] Lance, MD Inpatient   01/16/2016  5:14 PM 01/27/2016  9:30 PM Full Code [id number]  Bonnell Public, MD Inpatient   08/30/2015 12:33 AM 08/31/2015  5:14 PM Full Code [id number]  Edwin Dada, MD Inpatient   06/05/2013  4:19 AM 06/14/2013  5:41 PM Full Code [id number]  Charlton Haws, MD Inpatient   03/16/2013  9:54 PM 03/20/2013  6:18 PM Full Code [id number]  Theressa Millard, MD ED   01/06/2012 11:43 AM 01/09/2012  2:51 PM Full Code [id number]  Orvan July, RN Inpatient    Questions for Most Recent Historical Code Status (Order [id number])    Question Answer Comment   In the event of cardiac or respiratory ARREST Do not call a "code blue"    In the event of cardiac or respiratory ARREST Do not perform Intubation, CPR, defibrillation or ACLS    In the event of cardiac or respiratory ARREST Use medication by any route, position, wound care, and other measures to relive pain and suffering. May use oxygen, suction and manual treatment of airway obstruction as needed for comfort.         Advance Directive Documentation   Flowsheet Row Most Recent Value  Type of Advance Directive  Out of facility DNR (pink  MOST or yellow form)  Pre-existing out of facility DNR order (yellow form or pink MOST form)  No data  "MOST" Form in Place?  No data       Chief Complaint  Patient presents with  . Acute Visit    Pneumonia, Clostridium difficile    HISTORY OF PRESENT ILLNESS: This is a 80 year old male who is currently having a short-term rehabilitation @ [place]. He was having leukocytosis so a CXR was done. CXR showed atelectatic changes/pneumonia at both lower lobes, more on the left . He was having diarrhea and C-difficile toxin was detected in the stool. His KCL dosage was recently increased due to K = 3.0. Re-checked, today,  K = 3.7 .   He has been admitted to [facility] on 02/24/16 from [facility] with new onset of seizure and acute blood loss anemia from hematuria (Hgb 3.4). He was transfused PRBC and had urology and neurology consult. CT brain and EEG were unrevealing. He had cystoscopy with clot evacuation/fulgeration on 02/22/16. His electrolytes were repleated and his Clonidine was discontinued. Coreg dosage was decreased due to hypotension.   PAST MEDICAL HISTORY:  Past Medical History:  Diagnosis Date  . Angiomyolipoma 2009   On both kidneys noted in 2009  .  Arthritis    neck and left wrist  . Atrial fibrillation ([place])   . CAD (coronary artery disease)    s/b CABG 1994, and subsequent stents. Repeat CABG 12/2011,  . Chronic diastolic heart failure ([place])   . CKD (chronic kidney disease)   . Clostridium difficile diarrhea 03/2016  . Depressive disorder   . Diabetes mellitus type 2 with peripheral artery disease (HCC)    DIET CONTROLLED  . DVT (deep venous thrombosis) ([place]) 2011   Right arm  . Gastroparesis   . GERD (gastroesophageal reflux disease)   . Gout   . History of hiatal hernia   . Hyperlipidemia   . Hypertension   . Internal hemorrhoids without mention of complication   . Ischemic colitis ([place])   . 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 Myelodysplastic syndrome  ([place]) 05/22/2013   With low hemoglobin and platelets treated with Procrit  . Osteopenia   . Peptic ulcer    S/p partial gastrectomy in 1969  . Peripheral artery disease ([place])   . Pneumonia 01/16/2016  . Pneumonia 03/2016  . Presence of permanent cardiac pacemaker   . Prostate cancer ([place]) 1997   XRT and lupron  . Renal artery stenosis ([place])   . Sick sinus syndrome ([place])   . Vitamin B 12 deficiency      CURRENT MEDICATIONS: Reviewed  Patient's Medications  New Prescriptions   No medications on file  Previous Medications   ACETAMINOPHEN (ARTHRITIS PAIN RELIEF) 650 MG CR TABLET    Take 650 mg by mouth 3 (three) times daily.    AMIODARONE (PACERONE) 200 MG TABLET    Take one-half tablet by  mouth daily   ATORVASTATIN (LIPITOR) 80 MG TABLET    Take 80 mg by mouth at bedtime.    CALCIFEDIOL ER (RAYALDEE) 30 MCG CPCR    Take 30 mcg by mouth at bedtime.   CARVEDILOL (COREG) 6.25 MG TABLET    Take 1 tablet (6.25 mg total) by mouth 2 (two) times daily with a meal.   COLCHICINE 0.6 MG TABLET    Take 0.5 tablets (0.3 mg total) by mouth daily.   CYANOCOBALAMIN (,VITAMIN B-12,) 1000 MCG/ML INJECTION    Inject 1,000 mcg into the muscle every 30 (thirty) days. Vitamin B12 - last injection 09/01/15   EPOETIN ALFA (EPOGEN,PROCRIT) 09811 UNIT/ML INJECTION    Inject 10,000 Units into the skin every 14 (fourteen) days. Done at [facility] cancer center - last injection 09/22/15   FERROUS SULFATE 325 (65 FE) MG TABLET    Take 1 tablet (325 mg total) by mouth 3 (three) times daily with meals.   FUROSEMIDE (LASIX) 80 MG TABLET    Take 1 tablet (80 mg total) by mouth daily.   HYDRALAZINE (APRESOLINE) 25 MG TABLET    Take 3 tablets (75 mg total) by mouth 4 (four) times daily.   INSULIN ASPART (NOVOLOG) 100 UNIT/ML INJECTION    Inject 0-9 Units into the skin. SSI:  121-150 = 1 unit, 151-200 = 2 units, 201-250 = 3 units, 251-300 = 5 units, 301-350 = 7 units, 351-400 = 9 units   ISOSORBIDE MONONITRATE (IMDUR) 120 MG 24 HR  TABLET    Take 1 tablet by mouth  daily   LEUPROLIDE (LUPRON) 30 MG INJECTION    Inject 30 mg into the muscle every 4 (four) months.   LEVOFLOXACIN (LEVAQUIN) 500 MG TABLET    Take 500 mg by mouth daily.   METRONIDAZOLE (FLAGYL) 500 MG TABLET  Take 500 mg by mouth 3 (three) times daily.   MULTIPLE VITAMIN (MULTIVITAMIN WITH MINERALS) TABS TABLET    Take 1 tablet by mouth every morning.   PANTOPRAZOLE (PROTONIX) 40 MG TABLET    Take 1 tablet by mouth daily.    POTASSIUM CHLORIDE SA (K-DUR,KLOR-CON) 20 MEQ TABLET    Take 40 mEq by mouth 2 (two) times daily.   SACCHAROMYCES BOULARDII (FLORASTOR) 250 MG CAPSULE    Take 250 mg by mouth 2 (two) times daily.   SENNA-DOCUSATE (SENOKOT-S) 8.6-50 MG TABLET    Take 2 tablets by mouth 2 (two) times daily.   SILODOSIN (RAPAFLO) 8 MG CAPS CAPSULE    Take 8 mg by mouth at bedtime.    SODIUM BICARBONATE 650 MG TABLET    Take 1 tablet (650 mg total) by mouth 2 (two) times daily.   UNABLE TO FIND    Med Name: Med pass 120 mL 2 times daily  Modified Medications   No medications on file  Discontinued Medications   AMOXICILLIN-CLAVULANATE (AUGMENTIN) 875-125 MG TABLET    Take 1 tablet by mouth 2 (two) times daily.   INSULIN ASPART (NOVOLOG) 100 UNIT/ML INJECTION    Inject 0-9 Units into the skin 3 (three) times daily with meals.     Allergies  Allergen Reactions  . Nsaids Nausea Only    GI issue  . Ibuprofen Other (See Comments)    GI Issues  . Ace Inhibitors Cough     REVIEW OF SYSTEMS:  GENERAL: no change in appetite, no fatigue, no weight changes, no fever, chills  EYES: Denies change in vision, dry eyes, eye pain, itching or discharge EARS: Denies change in hearing, ringing in ears, or earache NOSE: Denies nasal congestion or epistaxis MOUTH and THROAT: Denies oral discomfort, gingival pain or bleeding, pain from teeth or hoarseness   RESPIRATORY: no cough, SOB, DOE, wheezing, hemoptysis CARDIAC: no chest pain, edema or palpitations GI: no  abdominal pain, constipation, heart burn, nausea or vomiting, +diarrhea GU: Denies dysuria, frequency, hematuria, incontinence, or discharge PSYCHIATRIC: Denies feeling of depression or anxiety. No report of hallucinations, insomnia, paranoia, or agitation    PHYSICAL EXAMINATION  GENERAL APPEARANCE: Well nourished. In no acute distress. Normal body habitus SKIN:  Skin is warm and dry.  HEAD: Normal in size and contour. No evidence of trauma EYES: Lids open and close normally. No blepharitis, entropion or ectropion. PERRL. Conjunctivae are clear and sclerae are white. Lenses are without opacity EARS: Pinnae are normal. Patient hears normal voice tunes of the examiner MOUTH and THROAT: Lips are without lesions. Oral mucosa is moist and without lesions. Tongue is normal in shape, size, and color and without lesions NECK: supple, trachea midline, no neck masses, no thyroid tenderness, no thyromegaly LYMPHATICS: no LAN in the neck, no supraclavicular LAN RESPIRATORY: breathing is even & unlabored, BS CTAB CARDIAC: RRR, no murmur,no extra heart sounds, BLE 2+ edema, left chest SL PICC GI: abdomen soft, normal BS, no masses, no tenderness, no hepatomegaly, no splenomegaly EXTREMITIES:  Able to move X 4 extremities PSYCHIATRIC: Alert and oriented X 3. Affect and behavior are appropriate  LABS/RADIOLOGY: Labs reviewed: Basic Metabolic Panel:  Recent Labs  02/01/16 0450 02/02/16 0553 02/02/16 0554 02/03/16 0601  02/21/16 0627 02/23/16 0500 02/24/16 0400  03/08/16 1457 03/17/16 1415 03/19/16  NA 139 139  --  139  < > 144 142 138  < > 142 141 142  K 3.3* 3.3*  --  3.8  < >  3.8 3.2* 3.4*  < > 3.6 3.0* 3.7  CL 110 109  --  109  < > 110 113* 110  --   --   --   --   CO2 21* 23  --  22  < > 23 21* 22  --   --   --   --   GLUCOSE 97 145*  --  118*  < > 113* 115* 115*  --   --   --   --   BUN 89* 82*  --  76*  < > 32* 29* 31*  < > 17 16 21   CREATININE 4.04* 3.73*  --  3.61*  < > 2.05* 2.00*  1.96*  < > 1.2 1.4* 1.7*  CALCIUM 8.2* 8.1*  --  8.2*  < > 7.7* 7.7* 7.7*  --   --   --   --   MG  --   --  1.8  --   --   --   --   --   --   --   --   --   PHOS 3.9 3.5  --  4.1  --   --   --   --   --   --   --   --   < > = values in this interval not displayed. Liver Function Tests:  Recent Labs  01/16/16 1239  02/03/16 0601 02/20/16 1430 02/21/16 0627 03/01/16 1357  AST 30  --   --  27 21 25   ALT 12*  --   --  6* <5* 7*  ALKPHOS 27*  --   --  44 37* 43  BILITOT 0.7  --   --  0.2* 0.7  --   PROT 7.6  --   --  5.4* 5.1*  --   ALBUMIN 4.2  < > 1.9* 2.2* 2.1*  --   < > = values in this interval not displayed.  CBC:  Recent Labs  02/23/16 0500 02/24/16 0400 03/01/16 1357 03/08/16 1346 03/17/16 1415  WBC 9.8 8.7 5.6 7.4 19.2  NEUTROABS  --   --  4 5.7 16  HGB 8.2* 7.6* 9.5* 9.1* 8.9*  HCT 25.1* 23.2* 30* 28.5* 28*  MCV 95.8 97.1  --  93.1  --   PLT 107* 100* 179 179 178    Cardiac Enzymes:  Recent Labs  01/25/16 1519 02/20/16 1816 02/21/16 0627  CKTOTAL 60  --   --   TROPONINI  --  0.04* 0.05*   CBG:  Recent Labs  02/23/16 2158 02/24/16 0747 02/24/16 1620  GLUCAP 188* 114* 195*       ASSESSMENT/PLAN:  HCAP - start Augmentin 875-125 mg 1 tab PO Q 12 hours X 10 days and Florastor 250 mg 1 capsule PO BID X 13 days  C-difficile colitis - start Metronidazole 500 mg 1 tab PO TID X 10 days  Hypokalemia - continue KCL ER 20 meq give 2 tabs = 40 meq PO TID X 2 more days then decrease to BID Lab Results  Component Value Date   K 3.7 03/19/2016        Durenda Age, NP [employer] [phone]

## 2016-03-22 ENCOUNTER — Other Ambulatory Visit: Payer: Self-pay | Admitting: Internal Medicine

## 2016-03-22 DIAGNOSIS — A3700 Whooping cough due to Bordetella pertussis without pneumonia: Secondary | ICD-10-CM

## 2016-03-22 DIAGNOSIS — B958 Unspecified staphylococcus as the cause of diseases classified elsewhere: Secondary | ICD-10-CM

## 2016-03-22 DIAGNOSIS — B999 Unspecified infectious disease: Secondary | ICD-10-CM

## 2016-03-25 ENCOUNTER — Non-Acute Institutional Stay (SKILLED_NURSING_FACILITY): Payer: Medicare Other | Admitting: Adult Health

## 2016-03-25 ENCOUNTER — Encounter: Payer: Self-pay | Admitting: Adult Health

## 2016-03-25 DIAGNOSIS — R569 Unspecified convulsions: Secondary | ICD-10-CM

## 2016-03-25 DIAGNOSIS — Z794 Long term (current) use of insulin: Secondary | ICD-10-CM

## 2016-03-25 DIAGNOSIS — R531 Weakness: Secondary | ICD-10-CM | POA: Diagnosis not present

## 2016-03-25 DIAGNOSIS — I480 Paroxysmal atrial fibrillation: Secondary | ICD-10-CM | POA: Diagnosis not present

## 2016-03-25 DIAGNOSIS — C61 Malignant neoplasm of prostate: Secondary | ICD-10-CM | POA: Diagnosis not present

## 2016-03-25 DIAGNOSIS — I10 Essential (primary) hypertension: Secondary | ICD-10-CM

## 2016-03-25 DIAGNOSIS — D638 Anemia in other chronic diseases classified elsewhere: Secondary | ICD-10-CM | POA: Diagnosis not present

## 2016-03-25 DIAGNOSIS — M1120 Other chondrocalcinosis, unspecified site: Secondary | ICD-10-CM

## 2016-03-25 DIAGNOSIS — E876 Hypokalemia: Secondary | ICD-10-CM

## 2016-03-25 DIAGNOSIS — M159 Polyosteoarthritis, unspecified: Secondary | ICD-10-CM

## 2016-03-25 DIAGNOSIS — J189 Pneumonia, unspecified organism: Secondary | ICD-10-CM

## 2016-03-25 DIAGNOSIS — A047 Enterocolitis due to Clostridium difficile: Secondary | ICD-10-CM

## 2016-03-25 DIAGNOSIS — E785 Hyperlipidemia, unspecified: Secondary | ICD-10-CM

## 2016-03-25 DIAGNOSIS — K5900 Constipation, unspecified: Secondary | ICD-10-CM

## 2016-03-25 DIAGNOSIS — I25810 Atherosclerosis of coronary artery bypass graft(s) without angina pectoris: Secondary | ICD-10-CM

## 2016-03-25 DIAGNOSIS — M1180 Other specified crystal arthropathies, unspecified site: Secondary | ICD-10-CM

## 2016-03-25 DIAGNOSIS — N184 Chronic kidney disease, stage 4 (severe): Secondary | ICD-10-CM

## 2016-03-25 DIAGNOSIS — E43 Unspecified severe protein-calorie malnutrition: Secondary | ICD-10-CM

## 2016-03-25 DIAGNOSIS — I5032 Chronic diastolic (congestive) heart failure: Secondary | ICD-10-CM

## 2016-03-25 DIAGNOSIS — N390 Urinary tract infection, site not specified: Secondary | ICD-10-CM

## 2016-03-25 DIAGNOSIS — E1122 Type 2 diabetes mellitus with diabetic chronic kidney disease: Secondary | ICD-10-CM

## 2016-03-25 DIAGNOSIS — R319 Hematuria, unspecified: Secondary | ICD-10-CM

## 2016-03-25 DIAGNOSIS — K219 Gastro-esophageal reflux disease without esophagitis: Secondary | ICD-10-CM

## 2016-03-25 DIAGNOSIS — A0472 Enterocolitis due to Clostridium difficile, not specified as recurrent: Secondary | ICD-10-CM

## 2016-03-25 LAB — CBC AND DIFFERENTIAL
HEMATOCRIT: 30 % — AB (ref 41–53)
HEMOGLOBIN: 9.5 g/dL — AB (ref 13.5–17.5)
Neutrophils Absolute: 6 /uL
Platelets: 177 10*3/uL (ref 150–399)
WBC: 10.4 10*3/mL

## 2016-03-25 LAB — BASIC METABOLIC PANEL
BUN: 25 mg/dL — AB (ref 4–21)
Creatinine: 1.9 mg/dL — AB (ref 0.6–1.3)
Glucose: 98 mg/dL
POTASSIUM: 3.7 mmol/L (ref 3.4–5.3)
SODIUM: 143 mmol/L (ref 137–147)

## 2016-03-25 NOTE — Progress Notes (Signed)
Patient ID: [name] Brow., male   DOB: 14-Nov-1935, 80 y.o.   MRN: [id number]    DATE:   03/25/16  MRN:  [id number]  BIRTHDAY: December 27, 1935  Facility:  Nursing Home Location:  [place] Room Number: 106-P  LEVEL OF CARE:  SNF (31)  Contact Information    Name Relation Home Work [place] Son [phone]  [phone]   [place] Milo Daughter [phone]  [phone]       Code Status History    Date Active Date Inactive Code Status Order ID Comments User Context   02/20/2016  5:09 PM 02/23/2016  1:09 PM DNR [id number]  Rondel Jumbo, PA-C ED   01/29/2016  5:34 PM 02/03/2016  6:31 PM DNR [id number]  Micheline Rough, MD Inpatient   01/27/2016  9:30 PM 01/29/2016  5:34 PM Full Code [id number]  Evans Lance, MD Inpatient   01/16/2016  5:14 PM 01/27/2016  9:30 PM Full Code [id number]  Bonnell Public, MD Inpatient   08/30/2015 12:33 AM 08/31/2015  5:14 PM Full Code [id number]  Edwin Dada, MD Inpatient   06/05/2013  4:19 AM 06/14/2013  5:41 PM Full Code [id number]  Charlton Haws, MD Inpatient   03/16/2013  9:54 PM 03/20/2013  6:18 PM Full Code [id number]  Theressa Millard, MD ED   01/06/2012 11:43 AM 01/09/2012  2:51 PM Full Code [id number]  Orvan July, RN Inpatient    Questions for Most Recent Historical Code Status (Order [id number])    Question Answer Comment   In the event of cardiac or respiratory ARREST Do not call a "code blue"    In the event of cardiac or respiratory ARREST Do not perform Intubation, CPR, defibrillation or ACLS    In the event of cardiac or respiratory ARREST Use medication by any route, position, wound care, and other measures to relive pain and suffering. May use oxygen, suction and manual treatment of airway obstruction as needed for comfort.         Advance Directive Documentation   Flowsheet Row Most Recent Value  Type of Advance Directive  Out of facility DNR (pink  MOST or yellow form)  Pre-existing out of facility DNR order (yellow form or pink MOST form)  No data  "MOST" Form in Place?  No data       Chief Complaint  Patient presents with  . Medical Management of Chronic Issues    HISTORY OF PRESENT ILLNESS:   This is a 80 year old male who is being seen for a routine visit. He is currently having a short-term rehabilitation @ [facility].      He has been admitted to [facility] on 02/24/16 from [facility] with new onset of seizure and acute blood loss anemia from hematuria (Hgb 3.4). He was transfused PRBC and had urology and neurology consult. CT brain and EEG were unrevealing. He had cystoscopy with clot evacuation/fulgeration on 02/22/16. His electrolytes were repleated and his Clonidine was discontinued. Coreg dosage was decreased due to hypotension.  PAST MEDICAL HISTORY:  Past Medical History:  Diagnosis Date  . Angiomyolipoma 2009   On both kidneys noted in 2009  . Arthritis    neck and left wrist  . Atrial fibrillation ([place])   . CAD (coronary artery disease)    s/b CABG 1994, and subsequent stents. Repeat CABG 12/2011,  . Chronic diastolic heart failure ([place])   .  CKD (chronic kidney disease)   . Clostridium difficile diarrhea 03/2016  . Depressive disorder   . Diabetes mellitus type 2 with peripheral artery disease (HCC)    DIET CONTROLLED  . DVT (deep venous thrombosis) ([place]) 2011   Right arm  . Gastroparesis   . GERD (gastroesophageal reflux disease)   . Gout   . History of hiatal hernia   . Hyperlipidemia   . Hypertension   . Internal hemorrhoids without mention of complication   . Ischemic colitis ([place])   . 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 Myelodysplastic syndrome ([place]) 05/22/2013   With low hemoglobin and platelets treated with Procrit  . Osteopenia   . Peptic ulcer    S/p partial gastrectomy in 1969  . Peripheral artery disease ([place])   . Pneumonia 01/16/2016  . Pneumonia 03/2016  . Presence of  permanent cardiac pacemaker   . Prostate cancer ([place]) 1997   XRT and lupron  . Renal artery stenosis ([place])   . Sick sinus syndrome ([place])   . Vitamin B 12 deficiency      CURRENT MEDICATIONS: Reviewed  Patient's Medications  New Prescriptions   No medications on file  Previous Medications   ACETAMINOPHEN (ARTHRITIS PAIN RELIEF) 650 MG CR TABLET    Take 650 mg by mouth 3 (three) times daily.    AMIODARONE (PACERONE) 200 MG TABLET    Take one-half tablet by  mouth daily   ATORVASTATIN (LIPITOR) 80 MG TABLET    Take 80 mg by mouth at bedtime.    CALCIFEDIOL ER (RAYALDEE) 30 MCG CPCR    Take 30 mcg by mouth at bedtime.   CARVEDILOL (COREG) 6.25 MG TABLET    Take 1 tablet (6.25 mg total) by mouth 2 (two) times daily with a meal.   COLCHICINE 0.6 MG TABLET    Take 0.5 tablets (0.3 mg total) by mouth daily.   CYANOCOBALAMIN (,VITAMIN B-12,) 1000 MCG/ML INJECTION    Inject 1,000 mcg into the muscle every 30 (thirty) days. Vitamin B12 - last injection 09/01/15   EPOETIN ALFA (EPOGEN,PROCRIT) 60454 UNIT/ML INJECTION    Inject 10,000 Units into the skin every 14 (fourteen) days. Done at [facility] cancer center - last injection 09/22/15   FERROUS SULFATE 325 (65 FE) MG TABLET    Take 1 tablet (325 mg total) by mouth 3 (three) times daily with meals.   FUROSEMIDE (LASIX) 80 MG TABLET    Take 1 tablet (80 mg total) by mouth daily.   HYDRALAZINE (APRESOLINE) 25 MG TABLET    Take 3 tablets (75 mg total) by mouth 4 (four) times daily.   INSULIN ASPART (NOVOLOG) 100 UNIT/ML INJECTION    Inject 0-9 Units into the skin. SSI:  121-150 = 1 unit, 151-200 = 2 units, 201-250 = 3 units, 251-300 = 5 units, 301-350 = 7 units, 351-400 = 9 units   ISOSORBIDE MONONITRATE (IMDUR) 120 MG 24 HR TABLET    Take 1 tablet by mouth  daily   LEUPROLIDE (LUPRON) 30 MG INJECTION    Inject 30 mg into the muscle every 4 (four) months.   LEVOFLOXACIN (LEVAQUIN) 500 MG TABLET    Take 500 mg by mouth daily.   METRONIDAZOLE (FLAGYL) 500 MG  TABLET    Take 500 mg by mouth 3 (three) times daily.   MULTIPLE VITAMIN (MULTIVITAMIN WITH MINERALS) TABS TABLET    Take 1 tablet by mouth every morning.   PANTOPRAZOLE (PROTONIX) 40 MG TABLET    Take 1 tablet by  mouth daily.    POTASSIUM CHLORIDE SA (K-DUR,KLOR-CON) 20 MEQ TABLET    Take 40 mEq by mouth 2 (two) times daily.   SACCHAROMYCES BOULARDII (FLORASTOR) 250 MG CAPSULE    Take 250 mg by mouth 2 (two) times daily.   SENNA-DOCUSATE (SENOKOT-S) 8.6-50 MG TABLET    Take 2 tablets by mouth 2 (two) times daily.   SILODOSIN (RAPAFLO) 8 MG CAPS CAPSULE    Take 8 mg by mouth at bedtime.    SODIUM BICARBONATE 650 MG TABLET    Take 1 tablet (650 mg total) by mouth 2 (two) times daily.   UNABLE TO FIND    Med Name: Med pass 120 mL 2 times daily  Modified Medications   No medications on file  Discontinued Medications   AMOXICILLIN-CLAVULANATE (AUGMENTIN) 875-125 MG TABLET    Take 1 tablet by mouth 2 (two) times daily.   INSULIN ASPART (NOVOLOG) 100 UNIT/ML INJECTION    Inject 0-9 Units into the skin 3 (three) times daily with meals.     Allergies  Allergen Reactions  . Nsaids Nausea Only    GI issue  . Ibuprofen Other (See Comments)    GI Issues  . Ace Inhibitors Cough     REVIEW OF SYSTEMS:  GENERAL: no change in appetite, no fatigue, no weight changes, no fever, chills or weakness SKIN: Denies rash, itching, wounds, ulcer sores, or nail abnormality EYES: Denies change in vision, dry eyes, eye pain, itching or discharge EARS: Denies change in hearing, ringing in ears, or earache NOSE: Denies nasal congestion or epistaxis MOUTH and THROAT: Denies oral discomfort, gingival pain or bleeding, pain from teeth or hoarseness   RESPIRATORY: no cough, SOB, DOE, wheezing, hemoptysis CARDIAC: no chest pain, edema or palpitations GI: no abdominal pain, diarrhea, constipation, heart burn, nausea or vomiting GU: Denies dysuria, frequency, hematuria, incontinence, or discharge PSYCHIATRIC:  Denies feeling of depression or anxiety. No report of hallucinations, insomnia, paranoia, or agitation    PHYSICAL EXAMINATION  GENERAL APPEARANCE: Well nourished. In no acute distress. Normal body habitus SKIN:  Skin is warm and dry.  HEAD: Normal in size and contour. No evidence of trauma EYES: Lids open and close normally. No blepharitis, entropion or ectropion. PERRL. Conjunctivae are clear and sclerae are white. Lenses are without opacity EARS: Pinnae are normal. Patient hears normal voice tunes of the examiner MOUTH and THROAT: Lips are without lesions. Oral mucosa is moist and without lesions. Tongue is normal in shape, size, and color and without lesions NECK: supple, trachea midline, no neck masses, no thyroid tenderness, no thyromegaly LYMPHATICS: no LAN in the neck, no supraclavicular LAN RESPIRATORY: breathing is even & unlabored, BS CTAB CARDIAC: RRR, no murmur,no extra heart sounds, BLE 2+ edema GI: abdomen soft, normal BS, no masses, no tenderness, no hepatomegaly, no splenomegaly EXTREMITIES:  Able to move X 4 extremities PSYCHIATRIC: Alert and oriented X 3. Affect and behavior are appropriate  LABS/RADIOLOGY: Labs reviewed: Basic Metabolic Panel:  Recent Labs  02/01/16 0450 02/02/16 0553 02/02/16 0554 02/03/16 0601  02/21/16 0627 02/23/16 0500 02/24/16 0400  03/08/16 1457 03/17/16 1415 03/19/16  NA 139 139  --  139  < > 144 142 138  < > 142 141 142  K 3.3* 3.3*  --  3.8  < > 3.8 3.2* 3.4*  < > 3.6 3.0* 3.7  CL 110 109  --  109  < > 110 113* 110  --   --   --   --  CO2 21* 23  --  22  < > 23 21* 22  --   --   --   --   GLUCOSE 97 145*  --  118*  < > 113* 115* 115*  --   --   --   --   BUN 89* 82*  --  76*  < > 32* 29* 31*  < > 17 16 21   CREATININE 4.04* 3.73*  --  3.61*  < > 2.05* 2.00* 1.96*  < > 1.2 1.4* 1.7*  CALCIUM 8.2* 8.1*  --  8.2*  < > 7.7* 7.7* 7.7*  --   --   --   --   MG  --   --  1.8  --   --   --   --   --   --   --   --   --   PHOS 3.9 3.5   --  4.1  --   --   --   --   --   --   --   --   < > = values in this interval not displayed. Liver Function Tests:  Recent Labs  01/16/16 1239  02/03/16 0601 02/20/16 1430 02/21/16 0627 03/01/16 1357  AST 30  --   --  27 21 25   ALT 12*  --   --  6* <5* 7*  ALKPHOS 27*  --   --  44 37* 43  BILITOT 0.7  --   --  0.2* 0.7  --   PROT 7.6  --   --  5.4* 5.1*  --   ALBUMIN 4.2  < > 1.9* 2.2* 2.1*  --   < > = values in this interval not displayed.  CBC:  Recent Labs  02/23/16 0500 02/24/16 0400 03/01/16 1357 03/08/16 1346 03/17/16 1415  WBC 9.8 8.7 5.6 7.4 19.2  NEUTROABS  --   --  4 5.7 16  HGB 8.2* 7.6* 9.5* 9.1* 8.9*  HCT 25.1* 23.2* 30* 28.5* 28*  MCV 95.8 97.1  --  93.1  --   PLT 107* 100* 179 179 178   Cardiac Enzymes:  Recent Labs  01/25/16 1519 02/20/16 1816 02/21/16 0627  CKTOTAL 60  --   --   TROPONINI  --  0.04* 0.05*   CBG:  Recent Labs  02/23/16 2158 02/24/16 0747 02/24/16 1620  GLUCAP 188* 114* 195*      ASSESSMENT/PLAN:  Generalized weakness - for rehabilitation, PT and OT, for therapeutic strengthening exercises; fall precaution  Seizure - not on any medication; no seizure episode  Anemia of chronic disease - S/P blood transfusion in the hospital;  Continue FeSO4 325 mg 1 tab PO TID and Epogen 10,000 units/ml IM Q 30 days Lab Results  Component Value Date   HGB 8.9 (A) 03/17/2016   Prostate Cancer - continue Lupron depot-pep  30 mg IM Q 4 months and Rapaflo 8 mg 1 capsule PO Q HS; follws-up with Dr. Noah Delaine, urology  Hypertension - continue  Carvedilol 6.25 mg 1 tab PO BID, Hydralazine 25 mg give 3 tabs = 75 mg  PO QID  GERD - continue Protonix 40 mg 1 tab PO Q D  C-Difficile colitis - no diarrhea; continue Metronidazole 500 mg 1 tab PO TID (stop date 03/29/16)  PAF - rate-controlled; continue Amiodarone 200 mg take 1/2 tab = 100 mg PO daily, Carvedilol 6.25 mg 1 tab PO BID  CAD - stable; continue Imdur ER 120 mg  1 tab PO  daily  Chronic diastolic CHF - no SOB; continue Lasix 80 mg 1 tab PO daily  Pseudogout - since creatinine 1.72; will discontinue Colchicine  Hyperlipidemia - continue Lipitor 80 mg 1 tab PO Q HS Lab Results  Component Value Date   CHOL 104 12/25/2011   HDL 56 12/25/2011   LDLCALC 40 12/25/2011   TRIG 41 12/25/2011   CHOLHDL 1.9 12/25/2011   CKD, stage 4 - baseline creatinine 3.8; continue Rayaldee ER 30 mcg 1 capsule PO Q HS Lab Results  Component Value Date   CREATININE 1.7 (A) 03/19/2016   Diabetes Mellitus, type 2 - continue Novolo SSI TID Lab Results  Component Value Date   HGBA1C 4.9 02/20/2016   Hypokalemia - continue KCL ER 20 meq take 2 tabs = 40 meq PO BID Lab Results  Component Value Date   K 3.7 03/19/2016    Osteoarthrosis - continue Tylenol arthritis ER 650 mg 1 tab PO TID  UTI - continue Levaquin 500 mg 1 tab PO D D (stop date 03/29/16)  HCAP - was started on Augmentin then changed to Levaquin 500 mg 1 tab PO daily (stop date 03/29/16)  Constipation - continue Senna-S 8.6-50 mg 2 tabs PO BID  Protein-calorie malnutrition, severe - albumin 2.1; continue Med pass 120 ml BID      Goals of care:  Short-term rehabilitation    Durenda Age, NP [place] [phone]

## 2016-03-29 ENCOUNTER — Encounter (HOSPITAL_BASED_OUTPATIENT_CLINIC_OR_DEPARTMENT_OTHER): Payer: Medicare Other

## 2016-03-29 ENCOUNTER — Other Ambulatory Visit (HOSPITAL_BASED_OUTPATIENT_CLINIC_OR_DEPARTMENT_OTHER): Payer: Medicare Other

## 2016-03-29 ENCOUNTER — Ambulatory Visit: Payer: Medicare Other

## 2016-03-29 DIAGNOSIS — D469 Myelodysplastic syndrome, unspecified: Secondary | ICD-10-CM

## 2016-03-29 LAB — CBC & DIFF AND RETIC
BASO%: 0.2 % (ref 0.0–2.0)
Basophils Absolute: 0 10*3/uL (ref 0.0–0.1)
EOS%: 1.4 % (ref 0.0–7.0)
Eosinophils Absolute: 0.1 10*3/uL (ref 0.0–0.5)
HCT: 27.6 % — ABNORMAL LOW (ref 38.4–49.9)
HGB: 8.9 g/dL — ABNORMAL LOW (ref 13.0–17.1)
IMMATURE RETIC FRACT: 11.7 % — AB (ref 3.00–10.60)
LYMPH#: 1.4 10*3/uL (ref 0.9–3.3)
LYMPH%: 16.7 % (ref 14.0–49.0)
MCH: 29.7 pg (ref 27.2–33.4)
MCHC: 32.2 g/dL (ref 32.0–36.0)
MCV: 92 fL (ref 79.3–98.0)
MONO#: 0.6 10*3/uL (ref 0.1–0.9)
MONO%: 7.1 % (ref 0.0–14.0)
NEUT%: 74.6 % (ref 39.0–75.0)
NEUTROS ABS: 6 10*3/uL (ref 1.5–6.5)
Platelets: 158 10*3/uL (ref 140–400)
RBC: 3 10*6/uL — AB (ref 4.20–5.82)
RDW: 22.8 % — ABNORMAL HIGH (ref 11.0–14.6)
RETIC CT ABS: 51 10*3/uL (ref 34.80–93.90)
Retic %: 1.7 % (ref 0.80–1.80)
WBC: 8.1 10*3/uL (ref 4.0–10.3)

## 2016-03-29 MED ORDER — DARBEPOETIN ALFA 200 MCG/0.4ML IJ SOSY
200.0000 ug | PREFILLED_SYRINGE | Freq: Once | INTRAMUSCULAR | Status: AC
  Administered 2016-03-29: 200 ug via SUBCUTANEOUS
  Filled 2016-03-29: qty 0.4

## 2016-03-29 NOTE — Patient Instructions (Signed)
Darbepoetin Alfa injection What is this medicine? DARBEPOETIN ALFA (dar be POE e tin AL fa) helps your body make more red blood cells. It is used to treat anemia caused by chronic kidney failure and chemotherapy. This medicine may be used for other purposes; ask your health care provider or pharmacist if you have questions. What should I tell my health care provider before I take this medicine? They need to know if you have any of these conditions: -blood clotting disorders or history of blood clots -cancer patient not on chemotherapy -cystic fibrosis -heart disease, such as angina, heart failure, or a history of a heart attack -hemoglobin level of 12 g/dL or greater -high blood pressure -low levels of folate, iron, or vitamin B12 -seizures -an unusual or allergic reaction to darbepoetin, erythropoietin, albumin, hamster proteins, latex, other medicines, foods, dyes, or preservatives -pregnant or trying to get pregnant -breast-feeding How should I use this medicine? This medicine is for injection into a vein or under the skin. It is usually given by a health care professional in a hospital or clinic setting. If you get this medicine at home, you will be taught how to prepare and give this medicine. Do not shake the solution before you withdraw a dose. Use exactly as directed. Take your medicine at regular intervals. Do not take your medicine more often than directed. It is important that you put your used needles and syringes in a special sharps container. Do not put them in a trash can. If you do not have a sharps container, call your pharmacist or healthcare provider to get one. Talk to your pediatrician regarding the use of this medicine in children. While this medicine may be used in children as young as 1 year for selected conditions, precautions do apply. Overdosage: If you think you have taken too much of this medicine contact a poison control center or emergency room at once. NOTE:  This medicine is only for you. Do not share this medicine with others. What if I miss a dose? If you miss a dose, take it as soon as you can. If it is almost time for your next dose, take only that dose. Do not take double or extra doses. What may interact with this medicine? Do not take this medicine with any of the following medications: -epoetin alfa This list may not describe all possible interactions. Give your health care provider a list of all the medicines, herbs, non-prescription drugs, or dietary supplements you use. Also tell them if you smoke, drink alcohol, or use illegal drugs. Some items may interact with your medicine. What should I watch for while using this medicine? Visit your prescriber or health care professional for regular checks on your progress and for the needed blood tests and blood pressure measurements. It is especially important for the doctor to make sure your hemoglobin level is in the desired range, to limit the risk of potential side effects and to give you the best benefit. Keep all appointments for any recommended tests. Check your blood pressure as directed. Ask your doctor what your blood pressure should be and when you should contact him or her. As your body makes more red blood cells, you may need to take iron, folic acid, or vitamin B supplements. Ask your doctor or health care provider which products are right for you. If you have kidney disease continue dietary restrictions, even though this medication can make you feel better. Talk with your doctor or health care professional about the   foods you eat and the vitamins that you take. What side effects may I notice from receiving this medicine? Side effects that you should report to your doctor or health care professional as soon as possible: -allergic reactions like skin rash, itching or hives, swelling of the face, lips, or tongue -breathing problems -changes in vision -chest pain -confusion, trouble speaking  or understanding -feeling faint or lightheaded, falls -high blood pressure -muscle aches or pains -pain, swelling, warmth in the leg -rapid weight gain -severe headaches -sudden numbness or weakness of the face, arm or leg -trouble walking, dizziness, loss of balance or coordination -seizures (convulsions) -swelling of the ankles, feet, hands -unusually weak or tired Side effects that usually do not require medical attention (report to your doctor or health care professional if they continue or are bothersome): -diarrhea -fever, chills (flu-like symptoms) -headaches -nausea, vomiting -redness, stinging, or swelling at site where injected This list may not describe all possible side effects. Call your doctor for medical advice about side effects. You may report side effects to FDA at 1-800-FDA-1088. Where should I keep my medicine? Keep out of the reach of children. Store in a refrigerator between 2 and 8 degrees C (36 and 46 degrees F). Do not freeze. Do not shake. Throw away any unused portion if using a single-dose vial. Throw away any unused medicine after the expiration date. NOTE: This sheet is a summary. It may not cover all possible information. If you have questions about this medicine, talk to your doctor, pharmacist, or health care provider.    2016, Elsevier/Gold Standard. (2008-06-11 10:23:57)  

## 2016-03-30 LAB — CBC AND DIFFERENTIAL
HCT: 28 % — AB (ref 41–53)
HEMOGLOBIN: 8.8 g/dL — AB (ref 13.5–17.5)
NEUTROS ABS: 4 /uL
PLATELETS: 158 10*3/uL (ref 150–399)
WBC: 7.4 10^3/mL

## 2016-03-30 LAB — BASIC METABOLIC PANEL
BUN: 27 mg/dL — AB (ref 4–21)
Creatinine: 1.9 mg/dL — AB (ref 0.6–1.3)
Glucose: 91 mg/dL
Potassium: 3.4 mmol/L (ref 3.4–5.3)
SODIUM: 142 mmol/L (ref 137–147)

## 2016-03-30 LAB — LIPID PANEL
CHOLESTEROL: 122 mg/dL (ref 0–200)
HDL: 51 mg/dL (ref 35–70)
LDL CALC: 58 mg/dL
TRIGLYCERIDES: 67 mg/dL (ref 40–160)

## 2016-03-30 LAB — TSH: TSH: 12.02 u[IU]/mL — AB (ref 0.41–5.90)

## 2016-03-31 ENCOUNTER — Encounter (HOSPITAL_COMMUNITY): Payer: Self-pay | Admitting: Interventional Radiology

## 2016-03-31 ENCOUNTER — Ambulatory Visit (HOSPITAL_COMMUNITY)
Admission: RE | Admit: 2016-03-31 | Discharge: 2016-03-31 | Disposition: A | Discharge status: Home / Self Care | Payer: Medicare Other | Source: Ambulatory Visit | Attending: Internal Medicine | Admitting: Internal Medicine

## 2016-03-31 ENCOUNTER — Non-Acute Institutional Stay (SKILLED_NURSING_FACILITY): Payer: Medicare Other | Admitting: Adult Health

## 2016-03-31 DIAGNOSIS — I10 Essential (primary) hypertension: Secondary | ICD-10-CM

## 2016-03-31 DIAGNOSIS — R946 Abnormal results of thyroid function studies: Secondary | ICD-10-CM | POA: Diagnosis not present

## 2016-03-31 DIAGNOSIS — B999 Unspecified infectious disease: Secondary | ICD-10-CM

## 2016-03-31 DIAGNOSIS — Z452 Encounter for adjustment and management of vascular access device: Secondary | ICD-10-CM | POA: Insufficient documentation

## 2016-03-31 DIAGNOSIS — B958 Unspecified staphylococcus as the cause of diseases classified elsewhere: Secondary | ICD-10-CM

## 2016-03-31 DIAGNOSIS — E876 Hypokalemia: Secondary | ICD-10-CM | POA: Diagnosis not present

## 2016-03-31 DIAGNOSIS — R7989 Other specified abnormal findings of blood chemistry: Secondary | ICD-10-CM

## 2016-03-31 DIAGNOSIS — A3700 Whooping cough due to Bordetella pertussis without pneumonia: Secondary | ICD-10-CM

## 2016-03-31 HISTORY — PX: IR GENERIC HISTORICAL: IMG1180011

## 2016-03-31 MED ORDER — CHLORHEXIDINE GLUCONATE 4 % EX LIQD
CUTANEOUS | Status: AC
  Filled 2016-03-31: qty 15

## 2016-03-31 MED ORDER — LIDOCAINE HCL 1 % IJ SOLN
INTRAMUSCULAR | Status: AC
  Filled 2016-03-31: qty 20

## 2016-03-31 NOTE — Progress Notes (Signed)
Patient ID: [name]., male   DOB: April 13, 1936, 80 y.o.   MRN: [id number]    DATE:   03/31/16  MRN:  [id number]  BIRTHDAY: March 26, 1936  Facility:  Nursing Home Location:  [place] Room Number: 106-P  LEVEL OF CARE:  SNF (31)  Contact Information    Name Relation Home Work [place] Son [phone]  [phone]   Hend[place] Milo Daughter [phone]  [phone]       Code Status History    Date Active Date Inactive Code Status Order ID Comments User Context   02/20/2016  5:09 PM 02/23/2016  1:09 PM DNR [id number]  Rondel Jumbo, PA-C ED   01/29/2016  5:34 PM 02/03/2016  6:31 PM DNR [id number]  Micheline Rough, MD Inpatient   01/27/2016  9:30 PM 01/29/2016  5:34 PM Full Code [id number]  Evans Lance, MD Inpatient   01/16/2016  5:14 PM 01/27/2016  9:30 PM Full Code [id number]  Bonnell Public, MD Inpatient   08/30/2015 12:33 AM 08/31/2015  5:14 PM Full Code [id number]  Edwin Dada, MD Inpatient   06/05/2013  4:19 AM 06/14/2013  5:41 PM Full Code [id number]  Charlton Haws, MD Inpatient   03/16/2013  9:54 PM 03/20/2013  6:18 PM Full Code [id number]  Theressa Millard, MD ED   01/06/2012 11:43 AM 01/09/2012  2:51 PM Full Code [id number]  Orvan July, RN Inpatient    Questions for Most Recent Historical Code Status (Order [id number])    Question Answer Comment   In the event of cardiac or respiratory ARREST Do not call a "code blue"    In the event of cardiac or respiratory ARREST Do not perform Intubation, CPR, defibrillation or ACLS    In the event of cardiac or respiratory ARREST Use medication by any route, position, wound care, and other measures to relive pain and suffering. May use oxygen, suction and manual treatment of airway obstruction as needed for comfort.         Advance Directive Documentation   Flowsheet Row Most Recent Value  Type of Advance Directive  Out of facility DNR (pink  MOST or yellow form)  Pre-existing out of facility DNR order (yellow form or pink MOST form)  No data  "MOST" Form in Place?  No data       Chief Complaint  Patient presents with  . Acute Visit    Elevated TSH, hypokalemia, hypertension    HISTORY OF PRESENT ILLNESS:   This is a 80 year old male who has tsh 12.022, K 3.4  . Patient verbalized that he feels that he is "getting stronger everyday." He is currently taking Amiodarone and not on Synthroid. He takes Lasix and has KCL supplementations. BP review - 174/85, 174/78, 182/91, 170/98. No complaints of dizziness nor headache.  He has been admitted to [facility] on 02/24/16 from [facility] with new onset of seizure and acute blood loss anemia from hematuria (Hgb 3.4). He was transfused PRBC and had urology and neurology consult. CT brain and EEG were unrevealing. He had cystoscopy with clot evacuation/fulgeration on 02/22/16. His electrolytes were repleated and his Clonidine was discontinued. Coreg dosage was decreased due to hypotension.  PAST MEDICAL HISTORY:  Past Medical History:  Diagnosis Date  . Angiomyolipoma 2009   On both kidneys noted in 2009  . Arthritis    neck and left wrist  . Atrial fibrillation (  [place])   . CAD (coronary artery disease)    s/b CABG 1994, and subsequent stents. Repeat CABG 12/2011,  . Chronic diastolic heart failure ([place])   . CKD (chronic kidney disease)   . Clostridium difficile diarrhea 03/2016  . Depressive disorder   . Diabetes mellitus type 2 with peripheral artery disease (HCC)    DIET CONTROLLED  . DVT (deep venous thrombosis) ([place]) 2011   Right arm  . Gastroparesis   . GERD (gastroesophageal reflux disease)   . Gout   . History of hiatal hernia   . Hyperlipidemia   . Hypertension   . Internal hemorrhoids without mention of complication   . Ischemic colitis ([place])   . 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 Myelodysplastic syndrome ([place]) 05/22/2013   With low hemoglobin and platelets  treated with Procrit  . Osteopenia   . Peptic ulcer    S/p partial gastrectomy in 1969  . Peripheral artery disease ([place])   . Pneumonia 01/16/2016  . Pneumonia 03/2016  . Presence of permanent cardiac pacemaker   . Prostate cancer ([place]) 1997   XRT and lupron  . Renal artery stenosis ([place])   . Sick sinus syndrome ([place])   . Vitamin B 12 deficiency      CURRENT MEDICATIONS: Reviewed  Patient's Medications  New Prescriptions   No medications on file  Previous Medications   ACETAMINOPHEN (ARTHRITIS PAIN RELIEF) 650 MG CR TABLET    Take 650 mg by mouth 3 (three) times daily.    AMIODARONE (PACERONE) 200 MG TABLET    Take one-half tablet by  mouth daily   ATORVASTATIN (LIPITOR) 80 MG TABLET    Take 80 mg by mouth at bedtime.    CALCIFEDIOL ER (RAYALDEE) 30 MCG CPCR    Take 30 mcg by mouth at bedtime.   CARVEDILOL (COREG) 12.5 MG TABLET    Take 12.5 mg by mouth 2 (two) times daily with a meal. Give a one time dose of 6.25 today at 1PM (03/31/16)   CYANOCOBALAMIN (,VITAMIN B-12,) 1000 MCG/ML INJECTION    Inject 1,000 mcg into the muscle every 30 (thirty) days. Vitamin B12 - last injection 09/01/15   EPOETIN ALFA (EPOGEN,PROCRIT) 13086 UNIT/ML INJECTION    Inject 10,000 Units into the skin every 14 (fourteen) days. Done at [facility] cancer center - last injection 09/22/15   FERROUS SULFATE 325 (65 FE) MG TABLET    Take 1 tablet (325 mg total) by mouth 3 (three) times daily with meals.   FUROSEMIDE (LASIX) 80 MG TABLET    Take 1 tablet (80 mg total) by mouth daily.   HYDRALAZINE (APRESOLINE) 25 MG TABLET    Take 3 tablets (75 mg total) by mouth 4 (four) times daily.   INSULIN ASPART (NOVOLOG) 100 UNIT/ML INJECTION    Inject 0-9 Units into the skin. SSI:  121-150 = 1 unit, 151-200 = 2 units, 201-250 = 3 units, 251-300 = 5 units, 301-350 = 7 units, 351-400 = 9 units   ISOSORBIDE MONONITRATE (IMDUR) 120 MG 24 HR TABLET    Take 1 tablet by mouth  daily   LEUPROLIDE (LUPRON) 30 MG INJECTION    Inject 30 mg  into the muscle every 4 (four) months.   MULTIPLE VITAMIN (MULTIVITAMIN WITH MINERALS) TABS TABLET    Take 1 tablet by mouth every morning.   PANTOPRAZOLE (PROTONIX) 40 MG TABLET    Take 1 tablet by mouth daily.    POTASSIUM CHLORIDE SA (K-DUR,KLOR-CON) 20 MEQ TABLET  Take 20-40 mEq by mouth. Give 40 mEq BID and 20 mEq qhs   SENNA-DOCUSATE (SENOKOT-S) 8.6-50 MG TABLET    Take 2 tablets by mouth 2 (two) times daily.   SILODOSIN (RAPAFLO) 8 MG CAPS CAPSULE    Take 8 mg by mouth at bedtime.    SODIUM BICARBONATE 650 MG TABLET    Take 1 tablet (650 mg total) by mouth 2 (two) times daily.   UNABLE TO FIND    Med Name: Med pass 120 mL 2 times daily  Modified Medications   No medications on file  Discontinued Medications   CARVEDILOL (COREG) 6.25 MG TABLET    Take 1 tablet (6.25 mg total) by mouth 2 (two) times daily with a meal.   COLCHICINE 0.6 MG TABLET    Take 0.5 tablets (0.3 mg total) by mouth daily.   SACCHAROMYCES BOULARDII (FLORASTOR) 250 MG CAPSULE    Take 250 mg by mouth 2 (two) times daily.     Allergies  Allergen Reactions  . Nsaids Nausea Only    GI issue  . Ibuprofen Other (See Comments)    GI Issues  . Ace Inhibitors Cough     REVIEW OF SYSTEMS:  GENERAL: no change in appetite, no fatigue, no weight changes, no fever, chills or weakness SKIN: Denies rash, itching, wounds, ulcer sores, or nail abnormality EYES: Denies change in vision, dry eyes, eye pain, itching or discharge EARS: Denies change in hearing, ringing in ears, or earache NOSE: Denies nasal congestion or epistaxis MOUTH and THROAT: Denies oral discomfort, gingival pain or bleeding, pain from teeth or hoarseness   RESPIRATORY: no cough, SOB, DOE, wheezing, hemoptysis CARDIAC: no chest pain, edema or palpitations GI: no abdominal pain, diarrhea, constipation, heart burn, nausea or vomiting GU: Denies dysuria, frequency, hematuria, incontinence, or discharge PSYCHIATRIC: Denies feeling of depression or  anxiety. No report of hallucinations, insomnia, paranoia, or agitation    PHYSICAL EXAMINATION  GENERAL APPEARANCE: Well nourished. In no acute distress. Normal body habitus SKIN:  Skin is warm and dry.  HEAD: Normal in size and contour. No evidence of trauma EYES: Lids open and close normally. No blepharitis, entropion or ectropion. PERRL. Conjunctivae are clear and sclerae are white. Lenses are without opacity EARS: Pinnae are normal. Patient hears normal voice tunes of the examiner MOUTH and THROAT: Lips are without lesions. Oral mucosa is moist and without lesions. Tongue is normal in shape, size, and color and without lesions NECK: supple, trachea midline, no neck masses, no thyroid tenderness, no thyromegaly LYMPHATICS: no LAN in the neck, no supraclavicular LAN RESPIRATORY: breathing is even & unlabored, BS CTAB CARDIAC: RRR, no murmur,no extra heart sounds, BLE 2+ edema GI: abdomen soft, normal BS, no masses, no tenderness, no hepatomegaly, no splenomegaly EXTREMITIES:  Able to move X 4 extremities PSYCHIATRIC: Alert and oriented X 3. Affect and behavior are appropriate  LABS/RADIOLOGY: Labs reviewed: Basic Metabolic Panel:  Recent Labs  02/01/16 0450 02/02/16 0553 02/02/16 0554 02/03/16 0601  02/21/16 0627 02/23/16 0500 02/24/16 0400  03/19/16 03/25/16 03/30/16  NA 139 139  --  139  < > 144 142 138  < > 142 143 142  K 3.3* 3.3*  --  3.8  < > 3.8 3.2* 3.4*  < > 3.7 3.7 3.4  CL 110 109  --  109  < > 110 113* 110  --   --   --   --   CO2 21* 23  --  22  < > 23  21* 22  --   --   --   --   GLUCOSE 97 145*  --  118*  < > 113* 115* 115*  --   --   --   --   BUN 89* 82*  --  76*  < > 32* 29* 31*  < > 21 25* 27*  CREATININE 4.04* 3.73*  --  3.61*  < > 2.05* 2.00* 1.96*  < > 1.7* 1.9* 1.9*  CALCIUM 8.2* 8.1*  --  8.2*  < > 7.7* 7.7* 7.7*  --   --   --   --   MG  --   --  1.8  --   --   --   --   --   --   --   --   --   PHOS 3.9 3.5  --  4.1  --   --   --   --   --   --   --    --   < > = values in this interval not displayed. Liver Function Tests:  Recent Labs  01/16/16 1239  02/03/16 0601 02/20/16 1430 02/21/16 0627 03/01/16 1357  AST 30  --   --  27 21 25   ALT 12*  --   --  6* <5* 7*  ALKPHOS 27*  --   --  44 37* 43  BILITOT 0.7  --   --  0.2* 0.7  --   PROT 7.6  --   --  5.4* 5.1*  --   ALBUMIN 4.2  < > 1.9* 2.2* 2.1*  --   < > = values in this interval not displayed.  CBC:  Recent Labs  02/24/16 0400  03/08/16 1346  03/25/16 03/29/16 1027 03/30/16  WBC 8.7  < > 7.4  < > 10.4 8.1 7.4  NEUTROABS  --   < > 5.7  < > 6 6.0 4  HGB 7.6*  < > 9.1*  < > 9.5* 8.9* 8.8*  HCT 23.2*  < > 28.5*  < > 30* 27.6* 28*  MCV 97.1  --  93.1  --   --  92.0  --   PLT 100*  < > 179  < > 177 158 158  < > = values in this interval not displayed. Cardiac Enzymes:  Recent Labs  01/25/16 1519 02/20/16 1816 02/21/16 0627  CKTOTAL 60  --   --   TROPONINI  --  0.04* 0.05*   CBG:  Recent Labs  02/23/16 2158 02/24/16 0747 02/24/16 1620  GLUCAP 188* 114* 195*      ASSESSMENT/PLAN:  Hypertension - increase  Carvedilol to 12.5 mg 1 tab PO BID, continue Hydralazine 25 mg give 3 tabs = 75 mg  PO QIDCAD - stable; continue Imdur ER 120 mg 1 tab PO daily;BP/HR BID X 1 week  Hypokalemia - continue KCL ER 20 meq take 2 tabs = 40 meq PO BID and add KCL ER 20 meq 1 tab PO Q HS; BMP in 1 week Lab Results  Component Value Date   K 3.4 03/30/2016   Elevated tsh - currently taking Amiodarone and not on Synthroid; check free T4 and T3 Lab Results  Component Value Date   TSH 12.02 (A) 03/30/2016      Durenda Age, NP [employer] [phone]

## 2016-04-07 LAB — BASIC METABOLIC PANEL
BUN: 21 mg/dL (ref 4–21)
Creatinine: 1.4 mg/dL — AB (ref 0.6–1.3)
Glucose: 91 mg/dL
Potassium: 3.6 mmol/L (ref 3.4–5.3)
Sodium: 145 mmol/L (ref 137–147)

## 2016-04-08 ENCOUNTER — Other Ambulatory Visit: Payer: Self-pay

## 2016-04-08 ENCOUNTER — Non-Acute Institutional Stay (SKILLED_NURSING_FACILITY): Payer: Medicare Other | Admitting: Internal Medicine

## 2016-04-08 ENCOUNTER — Emergency Department (HOSPITAL_COMMUNITY): Payer: Medicare Other

## 2016-04-08 ENCOUNTER — Inpatient Hospital Stay (HOSPITAL_COMMUNITY)
Admission: EM | Admit: 2016-04-08 | Discharge: 2016-04-16 | DRG: 289 | Disposition: A | Discharge status: Home / Self Care | Payer: Medicare Other | Attending: Internal Medicine | Admitting: Internal Medicine

## 2016-04-08 ENCOUNTER — Encounter (HOSPITAL_COMMUNITY): Payer: Self-pay | Admitting: Emergency Medicine

## 2016-04-08 ENCOUNTER — Encounter: Payer: Self-pay | Admitting: Internal Medicine

## 2016-04-08 DIAGNOSIS — D508 Other iron deficiency anemias: Secondary | ICD-10-CM | POA: Diagnosis not present

## 2016-04-08 DIAGNOSIS — I2510 Atherosclerotic heart disease of native coronary artery without angina pectoris: Secondary | ICD-10-CM | POA: Diagnosis present

## 2016-04-08 DIAGNOSIS — I5032 Chronic diastolic (congestive) heart failure: Secondary | ICD-10-CM | POA: Diagnosis present

## 2016-04-08 DIAGNOSIS — Z794 Long term (current) use of insulin: Secondary | ICD-10-CM

## 2016-04-08 DIAGNOSIS — A0472 Enterocolitis due to Clostridium difficile, not specified as recurrent: Secondary | ICD-10-CM | POA: Diagnosis present

## 2016-04-08 DIAGNOSIS — I700 Atherosclerosis of aorta: Secondary | ICD-10-CM | POA: Diagnosis present

## 2016-04-08 DIAGNOSIS — D631 Anemia in chronic kidney disease: Secondary | ICD-10-CM | POA: Diagnosis present

## 2016-04-08 DIAGNOSIS — M10072 Idiopathic gout, left ankle and foot: Secondary | ICD-10-CM | POA: Diagnosis present

## 2016-04-08 DIAGNOSIS — K219 Gastro-esophageal reflux disease without esophagitis: Secondary | ICD-10-CM | POA: Diagnosis present

## 2016-04-08 DIAGNOSIS — E785 Hyperlipidemia, unspecified: Secondary | ICD-10-CM | POA: Diagnosis not present

## 2016-04-08 DIAGNOSIS — F329 Major depressive disorder, single episode, unspecified: Secondary | ICD-10-CM | POA: Diagnosis present

## 2016-04-08 DIAGNOSIS — I330 Acute and subacute infective endocarditis: Secondary | ICD-10-CM | POA: Diagnosis present

## 2016-04-08 DIAGNOSIS — Z8546 Personal history of malignant neoplasm of prostate: Secondary | ICD-10-CM

## 2016-04-08 DIAGNOSIS — I480 Paroxysmal atrial fibrillation: Secondary | ICD-10-CM | POA: Diagnosis present

## 2016-04-08 DIAGNOSIS — E538 Deficiency of other specified B group vitamins: Secondary | ICD-10-CM | POA: Diagnosis present

## 2016-04-08 DIAGNOSIS — Z886 Allergy status to analgesic agent status: Secondary | ICD-10-CM

## 2016-04-08 DIAGNOSIS — Z79899 Other long term (current) drug therapy: Secondary | ICD-10-CM

## 2016-04-08 DIAGNOSIS — I160 Hypertensive urgency: Secondary | ICD-10-CM | POA: Diagnosis present

## 2016-04-08 DIAGNOSIS — E1122 Type 2 diabetes mellitus with diabetic chronic kidney disease: Secondary | ICD-10-CM | POA: Diagnosis present

## 2016-04-08 DIAGNOSIS — I10 Essential (primary) hypertension: Secondary | ICD-10-CM | POA: Diagnosis present

## 2016-04-08 DIAGNOSIS — R1084 Generalized abdominal pain: Secondary | ICD-10-CM

## 2016-04-08 DIAGNOSIS — M109 Gout, unspecified: Secondary | ICD-10-CM | POA: Diagnosis not present

## 2016-04-08 DIAGNOSIS — E876 Hypokalemia: Secondary | ICD-10-CM | POA: Diagnosis present

## 2016-04-08 DIAGNOSIS — Z86718 Personal history of other venous thrombosis and embolism: Secondary | ICD-10-CM

## 2016-04-08 DIAGNOSIS — Y842 Radiological procedure and radiotherapy as the cause of abnormal reaction of the patient, or of later complication, without mention of misadventure at the time of the procedure: Secondary | ICD-10-CM | POA: Diagnosis present

## 2016-04-08 DIAGNOSIS — K529 Noninfective gastroenteritis and colitis, unspecified: Secondary | ICD-10-CM | POA: Diagnosis present

## 2016-04-08 DIAGNOSIS — N3041 Irradiation cystitis with hematuria: Secondary | ICD-10-CM | POA: Diagnosis present

## 2016-04-08 DIAGNOSIS — I130 Hypertensive heart and chronic kidney disease with heart failure and stage 1 through stage 4 chronic kidney disease, or unspecified chronic kidney disease: Secondary | ICD-10-CM | POA: Diagnosis present

## 2016-04-08 DIAGNOSIS — R140 Abdominal distension (gaseous): Secondary | ICD-10-CM

## 2016-04-08 DIAGNOSIS — K3184 Gastroparesis: Secondary | ICD-10-CM | POA: Diagnosis present

## 2016-04-08 DIAGNOSIS — R9431 Abnormal electrocardiogram [ECG] [EKG]: Secondary | ICD-10-CM | POA: Diagnosis not present

## 2016-04-08 DIAGNOSIS — T501X5A Adverse effect of loop [high-ceiling] diuretics, initial encounter: Secondary | ICD-10-CM | POA: Diagnosis present

## 2016-04-08 DIAGNOSIS — I368 Other nonrheumatic tricuspid valve disorders: Secondary | ICD-10-CM | POA: Diagnosis present

## 2016-04-08 DIAGNOSIS — N183 Chronic kidney disease, stage 3 (moderate): Secondary | ICD-10-CM | POA: Diagnosis present

## 2016-04-08 DIAGNOSIS — I340 Nonrheumatic mitral (valve) insufficiency: Secondary | ICD-10-CM | POA: Diagnosis not present

## 2016-04-08 DIAGNOSIS — E1151 Type 2 diabetes mellitus with diabetic peripheral angiopathy without gangrene: Secondary | ICD-10-CM | POA: Diagnosis present

## 2016-04-08 DIAGNOSIS — D469 Myelodysplastic syndrome, unspecified: Secondary | ICD-10-CM | POA: Diagnosis present

## 2016-04-08 DIAGNOSIS — Z951 Presence of aortocoronary bypass graft: Secondary | ICD-10-CM

## 2016-04-08 DIAGNOSIS — Z87891 Personal history of nicotine dependence: Secondary | ICD-10-CM

## 2016-04-08 DIAGNOSIS — D509 Iron deficiency anemia, unspecified: Secondary | ICD-10-CM | POA: Diagnosis present

## 2016-04-08 DIAGNOSIS — E119 Type 2 diabetes mellitus without complications: Secondary | ICD-10-CM

## 2016-04-08 DIAGNOSIS — I39 Endocarditis and heart valve disorders in diseases classified elsewhere: Secondary | ICD-10-CM | POA: Diagnosis not present

## 2016-04-08 DIAGNOSIS — I701 Atherosclerosis of renal artery: Secondary | ICD-10-CM | POA: Diagnosis present

## 2016-04-08 DIAGNOSIS — Z22322 Carrier or suspected carrier of Methicillin resistant Staphylococcus aureus: Secondary | ICD-10-CM

## 2016-04-08 DIAGNOSIS — I371 Nonrheumatic pulmonary valve insufficiency: Secondary | ICD-10-CM | POA: Diagnosis not present

## 2016-04-08 DIAGNOSIS — D72829 Elevated white blood cell count, unspecified: Secondary | ICD-10-CM | POA: Diagnosis not present

## 2016-04-08 DIAGNOSIS — Z950 Presence of cardiac pacemaker: Secondary | ICD-10-CM

## 2016-04-08 DIAGNOSIS — I079 Rheumatic tricuspid valve disease, unspecified: Secondary | ICD-10-CM | POA: Diagnosis present

## 2016-04-08 DIAGNOSIS — E1121 Type 2 diabetes mellitus with diabetic nephropathy: Secondary | ICD-10-CM | POA: Diagnosis present

## 2016-04-08 DIAGNOSIS — J189 Pneumonia, unspecified organism: Secondary | ICD-10-CM | POA: Diagnosis present

## 2016-04-08 DIAGNOSIS — B9689 Other specified bacterial agents as the cause of diseases classified elsewhere: Secondary | ICD-10-CM | POA: Diagnosis not present

## 2016-04-08 DIAGNOSIS — E1143 Type 2 diabetes mellitus with diabetic autonomic (poly)neuropathy: Secondary | ICD-10-CM | POA: Diagnosis present

## 2016-04-08 DIAGNOSIS — Y95 Nosocomial condition: Secondary | ICD-10-CM | POA: Diagnosis present

## 2016-04-08 DIAGNOSIS — Z888 Allergy status to other drugs, medicaments and biological substances status: Secondary | ICD-10-CM

## 2016-04-08 DIAGNOSIS — D696 Thrombocytopenia, unspecified: Secondary | ICD-10-CM | POA: Diagnosis present

## 2016-04-08 DIAGNOSIS — E1169 Type 2 diabetes mellitus with other specified complication: Secondary | ICD-10-CM | POA: Diagnosis not present

## 2016-04-08 DIAGNOSIS — Z8249 Family history of ischemic heart disease and other diseases of the circulatory system: Secondary | ICD-10-CM

## 2016-04-08 LAB — URINALYSIS, ROUTINE W REFLEX MICROSCOPIC
Bilirubin Urine: NEGATIVE
GLUCOSE, UA: NEGATIVE mg/dL
KETONES UR: NEGATIVE mg/dL
NITRITE: NEGATIVE
PROTEIN: 30 mg/dL — AB
Specific Gravity, Urine: 1.016 (ref 1.005–1.030)
pH: 5 (ref 5.0–8.0)

## 2016-04-08 LAB — URINE MICROSCOPIC-ADD ON

## 2016-04-08 LAB — COMPREHENSIVE METABOLIC PANEL
ALBUMIN: 3.4 g/dL — AB (ref 3.5–5.0)
ALT: 10 U/L — AB (ref 17–63)
AST: 24 U/L (ref 15–41)
Alkaline Phosphatase: 58 U/L (ref 38–126)
Anion gap: 7 (ref 5–15)
BUN: 22 mg/dL — AB (ref 6–20)
CHLORIDE: 109 mmol/L (ref 101–111)
CO2: 24 mmol/L (ref 22–32)
CREATININE: 1.59 mg/dL — AB (ref 0.61–1.24)
Calcium: 8.8 mg/dL — ABNORMAL LOW (ref 8.9–10.3)
GFR calc Af Amer: 46 mL/min — ABNORMAL LOW (ref >60)
GFR, EST NON AFRICAN AMERICAN: 40 mL/min — AB (ref >60)
GLUCOSE: 152 mg/dL — AB (ref 65–99)
Potassium: 3.8 mmol/L (ref 3.5–5.1)
Sodium: 140 mmol/L (ref 135–145)
Total Bilirubin: 0.6 mg/dL (ref 0.3–1.2)
Total Protein: 7.9 g/dL (ref 6.5–8.1)

## 2016-04-08 LAB — CBC
HCT: 35.3 % — ABNORMAL LOW (ref 39.0–52.0)
Hemoglobin: 11 g/dL — ABNORMAL LOW (ref 13.0–17.0)
MCH: 30.2 pg (ref 26.0–34.0)
MCHC: 31.2 g/dL (ref 30.0–36.0)
MCV: 97 fL (ref 78.0–100.0)
PLATELETS: 182 10*3/uL (ref 150–400)
RBC: 3.64 MIL/uL — ABNORMAL LOW (ref 4.22–5.81)
RDW: 23.6 % — AB (ref 11.5–15.5)
WBC: 12.3 10*3/uL — AB (ref 4.0–10.5)

## 2016-04-08 LAB — C DIFFICILE QUICK SCREEN W PCR REFLEX
C DIFFICILE (CDIFF) INTERP: DETECTED
C Diff antigen: POSITIVE — AB
C Diff toxin: POSITIVE — AB

## 2016-04-08 LAB — I-STAT TROPONIN, ED: TROPONIN I, POC: 0.05 ng/mL (ref 0.00–0.08)

## 2016-04-08 LAB — PROTIME-INR
INR: 1
Prothrombin Time: 13.2 seconds (ref 11.4–15.2)

## 2016-04-08 LAB — LIPASE, BLOOD: LIPASE: 31 U/L (ref 11–51)

## 2016-04-08 LAB — TROPONIN I: Troponin I: 0.07 ng/mL (ref <0.03)

## 2016-04-08 LAB — GLUCOSE, CAPILLARY: GLUCOSE-CAPILLARY: 133 mg/dL — AB (ref 65–99)

## 2016-04-08 MED ORDER — AMIODARONE HCL 100 MG PO TABS
100.0000 mg | ORAL_TABLET | Freq: Every day | ORAL | Status: DC
  Administered 2016-04-09 – 2016-04-16 (×8): 100 mg via ORAL
  Filled 2016-04-08 (×7): qty 1

## 2016-04-08 MED ORDER — SODIUM CHLORIDE 0.9% FLUSH
3.0000 mL | Freq: Two times a day (BID) | INTRAVENOUS | Status: DC
  Administered 2016-04-09 – 2016-04-16 (×4): 3 mL via INTRAVENOUS

## 2016-04-08 MED ORDER — PIPERACILLIN-TAZOBACTAM 3.375 G IVPB 30 MIN
3.3750 g | Freq: Once | INTRAVENOUS | Status: AC
  Administered 2016-04-08: 3.375 g via INTRAVENOUS
  Filled 2016-04-08: qty 50

## 2016-04-08 MED ORDER — PIPERACILLIN-TAZOBACTAM 3.375 G IVPB: 3.3750 g | Freq: Three times a day (TID) | INTRAVENOUS | Status: DC

## 2016-04-08 MED ORDER — ACETAMINOPHEN 325 MG PO TABS
650.0000 mg | ORAL_TABLET | Freq: Four times a day (QID) | ORAL | Status: DC | PRN
  Administered 2016-04-13: 650 mg via ORAL
  Filled 2016-04-08: qty 2

## 2016-04-08 MED ORDER — LABETALOL HCL 5 MG/ML IV SOLN
10.0000 mg | INTRAVENOUS | Status: DC | PRN
  Administered 2016-04-08: 10 mg via INTRAVENOUS
  Filled 2016-04-08 (×3): qty 4

## 2016-04-08 MED ORDER — ACETAMINOPHEN 650 MG RE SUPP: 650.0000 mg | Freq: Four times a day (QID) | RECTAL | Status: DC | PRN

## 2016-04-08 MED ORDER — IOPAMIDOL (ISOVUE-300) INJECTION 61%
100.0000 mL | Freq: Once | INTRAVENOUS | Status: AC | PRN
  Administered 2016-04-08: 100 mL via INTRAVENOUS

## 2016-04-08 MED ORDER — CIPROFLOXACIN IN D5W 400 MG/200ML IV SOLN
400.0000 mg | Freq: Once | INTRAVENOUS | Status: AC
  Administered 2016-04-08: 400 mg via INTRAVENOUS
  Filled 2016-04-08: qty 200

## 2016-04-08 MED ORDER — CIPROFLOXACIN IN D5W 400 MG/200ML IV SOLN: 400.0000 mg | Freq: Two times a day (BID) | INTRAVENOUS | Status: DC

## 2016-04-08 MED ORDER — CALCIFEDIOL ER 30 MCG PO CPCR: 30.0000 ug | ORAL_CAPSULE | Freq: Every day | ORAL | Status: DC

## 2016-04-08 MED ORDER — HYDRALAZINE HCL 20 MG/ML IJ SOLN
10.0000 mg | INTRAMUSCULAR | Status: DC | PRN
  Administered 2016-04-08 – 2016-04-14 (×3): 10 mg via INTRAVENOUS
  Filled 2016-04-08 (×2): qty 1

## 2016-04-08 MED ORDER — ATORVASTATIN CALCIUM 40 MG PO TABS
80.0000 mg | ORAL_TABLET | Freq: Every day | ORAL | Status: DC
  Administered 2016-04-08 – 2016-04-15 (×8): 80 mg via ORAL
  Filled 2016-04-08 (×8): qty 2

## 2016-04-08 MED ORDER — ATORVASTATIN CALCIUM 80 MG PO TABS
80.0000 mg | ORAL_TABLET | Freq: Every day | ORAL | Status: DC
  Filled 2016-04-08: qty 1

## 2016-04-08 MED ORDER — HYDRALAZINE HCL 20 MG/ML IJ SOLN
INTRAMUSCULAR | Status: AC
  Filled 2016-04-08: qty 1

## 2016-04-08 MED ORDER — CARVEDILOL 12.5 MG PO TABS
12.5000 mg | ORAL_TABLET | Freq: Two times a day (BID) | ORAL | Status: DC
  Administered 2016-04-08 – 2016-04-15 (×14): 12.5 mg via ORAL
  Filled 2016-04-08 (×13): qty 1

## 2016-04-08 MED ORDER — ISOSORBIDE MONONITRATE ER 30 MG PO TB24
120.0000 mg | ORAL_TABLET | Freq: Every day | ORAL | Status: DC
  Administered 2016-04-09 – 2016-04-16 (×8): 120 mg via ORAL
  Filled 2016-04-08 (×2): qty 4
  Filled 2016-04-08: qty 2
  Filled 2016-04-08 (×2): qty 4
  Filled 2016-04-08 (×3): qty 2

## 2016-04-08 MED ORDER — METRONIDAZOLE IN NACL 5-0.79 MG/ML-% IV SOLN
500.0000 mg | Freq: Three times a day (TID) | INTRAVENOUS | Status: DC
  Administered 2016-04-08 – 2016-04-13 (×14): 500 mg via INTRAVENOUS
  Filled 2016-04-08 (×14): qty 100

## 2016-04-08 MED ORDER — PANTOPRAZOLE SODIUM 40 MG PO TBEC
40.0000 mg | DELAYED_RELEASE_TABLET | Freq: Every day | ORAL | Status: DC
  Administered 2016-04-09: 40 mg via ORAL
  Filled 2016-04-08: qty 1

## 2016-04-08 MED ORDER — ASPIRIN EC 81 MG PO TBEC
81.0000 mg | DELAYED_RELEASE_TABLET | Freq: Every day | ORAL | Status: DC
  Administered 2016-04-08 – 2016-04-16 (×9): 81 mg via ORAL
  Filled 2016-04-08 (×9): qty 1

## 2016-04-08 MED ORDER — FERROUS SULFATE 325 (65 FE) MG PO TABS
325.0000 mg | ORAL_TABLET | Freq: Three times a day (TID) | ORAL | Status: DC
Start: 2016-04-08 — End: 2016-04-16
  Administered 2016-04-08 – 2016-04-16 (×21): 325 mg via ORAL
  Filled 2016-04-08 (×21): qty 1

## 2016-04-08 MED ORDER — INSULIN ASPART 100 UNIT/ML ~~LOC~~ SOLN
0.0000 [IU] | SUBCUTANEOUS | Status: DC
  Administered 2016-04-08 – 2016-04-10 (×5): 2 [IU] via SUBCUTANEOUS
  Administered 2016-04-10 – 2016-04-11 (×2): 3 [IU] via SUBCUTANEOUS
  Administered 2016-04-11: 2 [IU] via SUBCUTANEOUS
  Administered 2016-04-12: 3 [IU] via SUBCUTANEOUS
  Administered 2016-04-12 – 2016-04-13 (×3): 2 [IU] via SUBCUTANEOUS
  Administered 2016-04-13: 3 [IU] via SUBCUTANEOUS
  Administered 2016-04-13: 2 [IU] via SUBCUTANEOUS
  Administered 2016-04-14 (×2): 3 [IU] via SUBCUTANEOUS
  Administered 2016-04-14 – 2016-04-15 (×3): 2 [IU] via SUBCUTANEOUS
  Administered 2016-04-15: 5 [IU] via SUBCUTANEOUS
  Administered 2016-04-16: 3 [IU] via SUBCUTANEOUS
  Administered 2016-04-16: 5 [IU] via SUBCUTANEOUS
  Administered 2016-04-16: 3 [IU] via SUBCUTANEOUS

## 2016-04-08 MED ORDER — HYDRALAZINE HCL 50 MG PO TABS
75.0000 mg | ORAL_TABLET | Freq: Four times a day (QID) | ORAL | Status: DC
  Administered 2016-04-08 – 2016-04-15 (×25): 75 mg via ORAL
  Filled 2016-04-08 (×25): qty 1

## 2016-04-08 MED ORDER — ONDANSETRON HCL 4 MG PO TABS: 4.0000 mg | ORAL_TABLET | Freq: Four times a day (QID) | ORAL | Status: DC | PRN

## 2016-04-08 MED ORDER — TAMSULOSIN HCL 0.4 MG PO CAPS
0.4000 mg | ORAL_CAPSULE | Freq: Every day | ORAL | Status: DC
  Administered 2016-04-08 – 2016-04-15 (×8): 0.4 mg via ORAL
  Filled 2016-04-08 (×8): qty 1

## 2016-04-08 MED ORDER — VANCOMYCIN HCL IN DEXTROSE 1-5 GM/200ML-% IV SOLN: 1000.0000 mg | INTRAVENOUS | Status: DC

## 2016-04-08 MED ORDER — HEPARIN SODIUM (PORCINE) 5000 UNIT/ML IJ SOLN
5000.0000 [IU] | Freq: Three times a day (TID) | INTRAMUSCULAR | Status: DC
  Administered 2016-04-08 – 2016-04-16 (×21): 5000 [IU] via SUBCUTANEOUS
  Filled 2016-04-08 (×21): qty 1

## 2016-04-08 MED ORDER — VANCOMYCIN HCL IN DEXTROSE 1-5 GM/200ML-% IV SOLN
1000.0000 mg | Freq: Once | INTRAVENOUS | Status: DC
  Administered 2016-04-08: 1000 mg via INTRAVENOUS
  Filled 2016-04-08: qty 200

## 2016-04-08 MED ORDER — ONDANSETRON HCL 4 MG/2ML IJ SOLN: 4.0000 mg | Freq: Four times a day (QID) | INTRAMUSCULAR | Status: DC | PRN

## 2016-04-08 NOTE — Progress Notes (Signed)
Pharmacy Antibiotic Note  [name]. is a 80 y.o. male admitted from [facility] on 04/08/2016 with pneumonia.  Pharmacy has been consulted for vancomycin and zosyn dosing.  Plan:  Vancomycin 1g IV q24h, goal trough 15-20 mcg/ml  Zosyn 3.375gm IV q8h (4hr extended infusions)  Follow up renal function & cultures, de-escalate as appropriate    Temp (24hrs), Avg:97.7 F (36.5 C), Min:97.1 F (36.2 C), Max:98.3 F (36.8 C)   Recent Labs Lab 04/07/16 04/08/16 1503  WBC  --  12.3*  CREATININE 1.4* 1.59*    Estimated Creatinine Clearance: 39.5 mL/min (by C-G formula based on SCr of 1.59 mg/dL (H)).    Allergies  Allergen Reactions  . Nsaids Nausea Only    GI issue  . Ibuprofen Other (See Comments)    GI Issues  . Ace Inhibitors Cough    Antimicrobials this admission: 9/28 Vancomycin >> 9/28 Zosyn >>  Dose adjustments this admission: ---  Microbiology results: 9/28 UCx: 9/28 C.diff:  Thank you for allowing pharmacy to be a part of this patient's care.  Peggyann Juba, PharmD, BCPS Pager: [phone] 04/08/2016 4:56 PM

## 2016-04-08 NOTE — ED Notes (Signed)
Bed: WA09 Expected date:  Expected time:  Means of arrival:  Comments: EMS- 80yo M, abdominal pain x 68mins

## 2016-04-08 NOTE — ED Triage Notes (Signed)
Per EMS pt from [facility]. The plan was to discharge him from the rehab yet pt reported gl abd pain, no nausea no emesis. Last BM today, no blood in stool.. Alert and oriented x 4. Ambulatory with 1 person assist per EMS. BP 174/80 HR 80. RR 16.

## 2016-04-08 NOTE — H&P (Signed)
History and Physical    [name] [id number] DOB: 05-Oct-1935 DOA: 04/08/2016  PCP: [name], MD  Patient coming from: Skilled nursing facility  Chief Complaint: Abdominal pain  HPI: [name]. is a 80 y.o. male with medical history significant of diabetes, history of atrial fibrillation, CAD status post CABG, document history of C. difficile colitis, hyperlipidemia, hypertension who presents emerged department with complaints of abdominal pains with associated shortness of breath. Reports decreased appetite. Symptoms started 2 days prior to admission. Abd pain described as constant beginning on day of admission. No alleviating factors.  ED Course: In emergency department, patient noted to have evidence of colitis on CT scan of the abdomen. Chest x-ray had findings of blood this is versus possible infiltrate. Patient noted to have a white blood count of 12.3 thousand with creatinine of 1.59. Of note, patient noted to have T-wave inversions that were new on EKG. Troponins were ordered, still pending. Hospitalist of swelling consulted for consideration for admission  Review of Systems:  Review of Systems  Constitutional: Negative for chills and fever.  HENT: Negative for congestion and ear pain.   Eyes: Negative for double vision, discharge and redness.  Respiratory: Positive for shortness of breath. Negative for wheezing.   Cardiovascular: Negative for palpitations and leg swelling.  Gastrointestinal: Positive for abdominal pain. Negative for melena.  Genitourinary: Negative for hematuria and urgency.  Musculoskeletal: Negative for back pain and joint pain.  Skin: Negative for itching and rash.  Neurological: Negative for tingling, tremors, loss of consciousness and headaches.  Psychiatric/Behavioral: Negative for hallucinations and memory loss.    Past Medical History:  Diagnosis Date  . Angiomyolipoma 2009   On both kidneys noted in 2009  . Arthritis     neck and left wrist  . Atrial fibrillation ([place])   . CAD (coronary artery disease)    s/b CABG 1994, and subsequent stents. Repeat CABG 12/2011,  . Chronic diastolic heart failure ([place])   . CKD (chronic kidney disease)   . Clostridium difficile diarrhea 03/2016  . Depressive disorder   . Diabetes mellitus type 2 with peripheral artery disease (HCC)    DIET CONTROLLED  . DVT (deep venous thrombosis) ([place]) 2011   Right arm  . Gastroparesis   . GERD (gastroesophageal reflux disease)   . Gout   . History of hiatal hernia   . Hyperlipidemia   . Hypertension   . Internal hemorrhoids without mention of complication   . Ischemic colitis ([place])   . 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 Myelodysplastic syndrome ([place]) 05/22/2013   With low hemoglobin and platelets treated with Procrit  . Osteopenia   . Peptic ulcer    S/p partial gastrectomy in 1969  . Peripheral artery disease ([place])   . Pneumonia 01/16/2016  . Pneumonia 03/2016  . Presence of permanent cardiac pacemaker   . Prostate cancer ([place]) 1997   XRT and lupron  . Renal artery stenosis ([place])   . Sick sinus syndrome ([place])   . Vitamin B 12 deficiency     Past Surgical History:  Procedure Laterality Date  . ABDOMINAL ANGIOGRAM N/A 05/25/2011   Procedure: ABDOMINAL ANGIOGRAM;  Surgeon: Serafina Mitchell, MD;  Location: [facility] CATH LAB;  Service: Cardiovascular;  Laterality: N/A;  . ABDOMINAL ANGIOGRAM N/A 02/13/2013   Procedure: ABDOMINAL ANGIOGRAM;  Surgeon: Serafina Mitchell, MD;  Location: [facility] CATH LAB;  Service: Cardiovascular;  Laterality: N/A;  . APPENDECTOMY  1991  . CELIAC ARTERY ANGIOPLASTY  05-16-12   and stenting  . CHOLECYSTECTOMY  Oct 2009   Laparoscopic  . CORONARY ARTERY BYPASS GRAFT  01/22/1993  . CORONARY ARTERY BYPASS GRAFT  01/03/2012   Procedure: REDO CORONARY ARTERY BYPASS GRAFTING (CABG);  Surgeon: Gaye Pollack, MD;  Location: [place];  Service: Open Heart Surgery;  Laterality: N/A;  Redo CABG x  using bilateral internal  mammary arteries;  left leg greater saphenous vein harvested endoscopically  . FOREIGN BODY REMOVAL ABDOMINAL Right 01/27/2016   Procedure: EXPOSURE OF RIGHT COMMON FEMORAL ARTERY AND REMOVAL FOREIGN;  Surgeon: Evans Lance, MD;  Location: [place];  Service: Cardiovascular;  Laterality: Right;  . HIATAL HERNIA REPAIR     and ulcer repair  . I&D EXTREMITY Left 01/21/2016   Procedure: IRRIGATION AND DEBRIDEMENT EXTREMITY;  Surgeon: Milly Jakob, MD;  Location: [place];  Service: Orthopedics;  Laterality: Left;  . INGUINAL HERNIA REPAIR Right 10/28/2015   Procedure: OPEN RIGHT INGUINAL HERNIA REPAIR;  Surgeon: Greer Pickerel, MD;  Location: [facility];  Service: General;  Laterality: Right;  . INSERTION OF MESH Right 10/28/2015   Procedure: INSERTION OF MESH;  Surgeon: Greer Pickerel, MD;  Location: [facility];  Service: General;  Laterality: Right;  . IR GENERIC HISTORICAL  02/02/2016   IR FLUORO GUIDE CV LINE LEFT 02/02/2016 Markus Daft, MD [facility]  . IR GENERIC HISTORICAL  02/02/2016   IR US GUIDE VASC ACCESS LEFT 02/02/2016 Markus Daft, MD [facility]  . IR GENERIC HISTORICAL  03/31/2016   IR REMOVAL TUN CV CATH W/O FL 03/31/2016 Arne Cleveland, MD [facility]  . LEFT HEART CATHETERIZATION WITH CORONARY/GRAFT ANGIOGRAM  12/24/2011   Procedure: LEFT HEART CATHETERIZATION WITH Beatrix Fetters;  Surgeon: Sinclair Grooms, MD;  Location: [facility] CATH LAB;  Service: Cardiovascular;;  . LOWER EXTREMITY ANGIOGRAM Bilateral 05/25/2011   Procedure: LOWER EXTREMITY ANGIOGRAM;  Surgeon: Serafina Mitchell, MD;  Location: [facility] CATH LAB;  Service: Cardiovascular;  Laterality: Bilateral;  . OTHER SURGICAL HISTORY  02/13/13   superior mesenteric artery angiogram  . OTHER SURGICAL HISTORY  05/16/12   Stent in stomach  . PACEMAKER GENERATOR CHANGE  12/10/2003   SJM Identity XL DR performed by Dr Leonia Reeves  . PACEMAKER INSERTION  10/18/1994   DDD pacemaker, [employer]. Gen change 12/10/2003.  Marland Kitchen PACEMAKER LEAD REMOVAL Right 01/27/2016     Procedure: PACEMAKER EXTRACTION;  Surgeon: Evans Lance, MD;  Location: [place];  Service: Cardiovascular;  Laterality: Right;  DR. Roxy Manns TO BACKUP CASE  . PARTIAL GASTRECTOMY  1969   Hx of ulcer s/p partial gastrectomy/ has pernicious anemia  . RENAL ANGIOGRAM N/A 02/13/2013   Procedure: RENAL ANGIOGRAM;  Surgeon: Serafina Mitchell, MD;  Location: [facility] CATH LAB;  Service: Cardiovascular;  Laterality: N/A;  . TEE WITHOUT CARDIOVERSION N/A 01/26/2016   Procedure: TRANSESOPHAGEAL ECHOCARDIOGRAM (TEE);  Surgeon: Sueanne Margarita, MD;  Location: [facility] ENDOSCOPY;  Service: Cardiovascular;  Laterality: N/A;  . TEE WITHOUT CARDIOVERSION N/A 01/27/2016   Procedure: TRANSESOPHAGEAL ECHOCARDIOGRAM (TEE);  Surgeon: Evans Lance, MD;  Location: [place];  Service: Cardiovascular;  Laterality: N/A;  . THROMBECTOMY / EMBOLECTOMY SUBCLAVIAN ARTERY  02/02/10   Right subclavian thromboectomy and venous angioplasty, and chronic mesenteric ischemia with Herculink stenting to superior mesenteric and celiac arteries - Dr. Trula Slade  . TRANSURETHRAL RESECTION OF PROSTATE N/A 02/22/2016   Procedure: CYSTO, CLOT EVACUATION, FULGERATION;  Surgeon: Alexis Frock, MD;  Location: [facility];  Service: Urology;  Laterality: N/A;  . VISCERAL  ANGIOGRAM N/A 05/25/2011   Procedure: VISCERAL ANGIOGRAM;  Surgeon: Serafina Mitchell, MD;  Location: [facility] CATH LAB;  Service: Cardiovascular;  Laterality: N/A;  . VISCERAL ANGIOGRAM Bilateral 12/28/2011   Procedure: VISCERAL ANGIOGRAM;  Surgeon: Serafina Mitchell, MD;  Location: [facility] CATH LAB;  Service: Cardiovascular;  Laterality: Bilateral;  . VISCERAL ANGIOGRAM N/A 05/16/2012   Procedure: VISCERAL ANGIOGRAM;  Surgeon: Serafina Mitchell, MD;  Location: [facility] CATH LAB;  Service: Cardiovascular;  Laterality: N/A;  . VISCERAL ANGIOGRAM N/A 02/13/2013   Procedure: VISCERAL ANGIOGRAM;  Surgeon: Serafina Mitchell, MD;  Location: [facility] CATH LAB;  Service: Cardiovascular;  Laterality: N/A;   Social history:  reports that he quit  smoking about 46 years ago. His smoking use included Cigarettes. He quit after 20.00 years of use. He has never used smokeless tobacco. He reports that he does not drink alcohol or use drugs.  Allergies  Allergen Reactions  . Nsaids Nausea Only    GI issue  . Ibuprofen Other (See Comments)    GI Issues  . Ace Inhibitors Cough    Family History  Problem Relation Age of Onset  . Diabetes Mother   . Heart disease Mother     Heart Disease before age 65  . Hyperlipidemia Mother   . Hypertension Mother   . CVA Mother 14    cause of death  . Cancer Father     stomach/liver  . Hypertension Father     possibly hypertensive  . Cancer Sister     Breast cancer  . CAD Brother   . Heart disease Brother   . Cancer Paternal Uncle     colon  . Heart disease Sister   . Diabetes Sister   . Hypertension Sister   . Heart disease Daughter     Heart Disease before age 43  . Hypertension Daughter   . Heart attack Daughter     Prior to Admission medications   Medication Sig Start Date End Date Taking? Authorizing Provider  acetaminophen (ARTHRITIS PAIN RELIEF) 650 MG CR tablet Take 650 mg by mouth 3 (three) times daily.     Historical Provider, MD  amiodarone (PACERONE) 200 MG tablet Take one-half tablet by  mouth daily 01/07/16   Belva Crome, MD  atorvastatin (LIPITOR) 80 MG tablet Take 80 mg by mouth at bedtime.     Historical Provider, MD  Calcifediol ER (RAYALDEE) 30 MCG CPCR Take 30 mcg by mouth at bedtime.    Historical Provider, MD  carvedilol (COREG) 12.5 MG tablet Take 12.5 mg by mouth 2 (two) times daily with a meal. Give a one time dose of 6.25 today at 1PM (03/31/16)    Historical Provider, MD  cyanocobalamin (,VITAMIN B-12,) 1000 MCG/ML injection Inject 1,000 mcg into the muscle every 30 (thirty) days. Vitamin B12 - last injection 09/01/15    Historical Provider, MD  epoetin alfa (EPOGEN,PROCRIT) 16109 UNIT/ML injection Inject 10,000 Units into the skin every 14 (fourteen) days.  Done at [facility] - last injection 09/22/15    Historical Provider, MD  ferrous sulfate 325 (65 FE) MG tablet Take 1 tablet (325 mg total) by mouth 3 (three) times daily with meals. 02/24/16   Venetia Maxon Rama, MD  furosemide (LASIX) 80 MG tablet Take 1 tablet (80 mg total) by mouth daily. 02/24/16   Venetia Maxon Rama, MD  hydrALAZINE (APRESOLINE) 25 MG tablet Take 3 tablets (75 mg total) by mouth 4 (four) times daily. 02/24/16   Christina P  Rama, MD  insulin aspart (NOVOLOG) 100 UNIT/ML injection Inject 0-9 Units into the skin. SSI:  121-150 = 1 unit, 151-200 = 2 units, 201-250 = 3 units, 251-300 = 5 units, 301-350 = 7 units, 351-400 = 9 units    Historical Provider, MD  isosorbide mononitrate (IMDUR) 120 MG 24 hr tablet Take 1 tablet by mouth  daily 01/07/16   Belva Crome, MD  leuprolide (LUPRON) 30 MG injection Inject 30 mg into the muscle every 4 (four) months.    Historical Provider, MD  Multiple Vitamin (MULTIVITAMIN WITH MINERALS) TABS tablet Take 1 tablet by mouth every morning.    Historical Provider, MD  pantoprazole (PROTONIX) 40 MG tablet Take 1 tablet by mouth daily.  01/14/16   Historical Provider, MD  potassium chloride SA (K-DUR,KLOR-CON) 20 MEQ tablet Take 20-40 mEq by mouth. Give 40 mEq BID and 20 mEq qhs    Historical Provider, MD  senna-docusate (SENOKOT-S) 8.6-50 MG tablet Take 2 tablets by mouth 2 (two) times daily. 02/03/16   Silver Huguenin Elgergawy, MD  silodosin (RAPAFLO) 8 MG CAPS capsule Take 8 mg by mouth at bedtime.     Historical Provider, MD  sodium bicarbonate 650 MG tablet Take 1 tablet (650 mg total) by mouth 2 (two) times daily. 02/03/16   Albertine Patricia, MD  UNABLE TO FIND Med Name: Med pass 120 mL 2 times daily    Historical Provider, MD    Physical Exam: Vitals:   04/08/16 1442 04/08/16 1714  BP: (!) 217/97 (!) 210/95  Pulse: 88 90  Resp: 15 25  Temp: 98.3 F (36.8 C)   TempSrc: Oral   SpO2: 92% 91%    Constitutional: NAD, calm, comfortable Vitals:    04/08/16 1442 04/08/16 1714  BP: (!) 217/97 (!) 210/95  Pulse: 88 90  Resp: 15 25  Temp: 98.3 F (36.8 C)   TempSrc: Oral   SpO2: 92% 91%   Eyes: PERRL, lids and conjunctivae normal ENMT: Mucous membranes are moist. Posterior pharynx clear of any exudate or lesions.Normal dentition.  Neck: normal, supple, no masses, no thyromegaly Respiratory: clear to auscultation bilaterally, no wheezing, no crackles. Normal respiratory effort. No accessory muscle use.  Cardiovascular: Regular rate and rhythm, s1-s2 Abdomen: generally tender, pos BS, mildly distended Musculoskeletal: no clubbing / cyanosis. No joint deformity upper and lower extremities. Good ROM, no contractures. Normal muscle tone.  Skin: no rashes, lesions, ulcers. No induration Neurologic: CN 2-12 grossly intact. Sensation intact, DTR normal. Strength 5/5 in all 4.  Psychiatric: Normal judgment and insight. Alert and oriented x 3. Normal mood.    Labs on Admission: I have personally reviewed following labs and imaging studies  CBC:  Recent Labs Lab 04/08/16 1503  WBC 12.3*  HGB 11.0*  HCT 35.3*  MCV 97.0  PLT [id number]   Basic Metabolic Panel:  Recent Labs Lab 04/07/16 04/08/16 1503  NA 145 140  K 3.6 3.8  CL  --  109  CO2  --  24  GLUCOSE  --  152*  BUN 21 22*  CREATININE 1.4* 1.59*  CALCIUM  --  8.8*   GFR: Estimated Creatinine Clearance: 39.5 mL/min (by C-G formula based on SCr of 1.59 mg/dL (H)). Liver Function Tests:  Recent Labs Lab 04/08/16 1503  AST 24  ALT 10*  ALKPHOS 58  BILITOT 0.6  PROT 7.9  ALBUMIN 3.4*    Recent Labs Lab 04/08/16 1503  LIPASE 31   No results for input(s): AMMONIA in the  last 168 hours. Coagulation Profile:  Recent Labs Lab 04/08/16 1503  INR 1.00   Cardiac Enzymes: No results for input(s): CKTOTAL, CKMB, CKMBINDEX, TROPONINI in the last 168 hours. BNP (last 3 results) No results for input(s): PROBNP in the last 8760 hours. HbA1C: No results for input(s):  HGBA1C in the last 72 hours. CBG: No results for input(s): GLUCAP in the last 168 hours. Lipid Profile: No results for input(s): CHOL, HDL, LDLCALC, TRIG, CHOLHDL, LDLDIRECT in the last 72 hours. Thyroid Function Tests: No results for input(s): TSH, T4TOTAL, FREET4, T3FREE, THYROIDAB in the last 72 hours. Anemia Panel: No results for input(s): VITAMINB12, FOLATE, FERRITIN, TIBC, IRON, RETICCTPCT in the last 72 hours. Urine analysis:    Component Value Date/Time   COLORURINE RED (A) 01/26/2016 1209   APPEARANCEUR TURBID (A) 01/26/2016 1209   LABSPEC 1.023 01/26/2016 1209   LABSPEC 1.010 05/19/2015 1231   PHURINE 5.0 01/26/2016 1209   GLUCOSEU NEGATIVE 01/26/2016 1209   GLUCOSEU Negative 05/19/2015 1231   HGBUR LARGE (A) 01/26/2016 1209   BILIRUBINUR LARGE (A) 01/26/2016 1209   BILIRUBINUR Negative 05/19/2015 1231   KETONESUR 15 (A) 01/26/2016 1209   PROTEINUR >300 (A) 01/26/2016 1209   UROBILINOGEN 0.2 05/19/2015 1231   NITRITE POSITIVE (A) 01/26/2016 1209   LEUKOCYTESUR MODERATE (A) 01/26/2016 1209   LEUKOCYTESUR Negative 05/19/2015 1231   Sepsis Labs: !!!!!!!!!!!!!!!!!!!!!!!!!!!!!!!!!!!!!!!!!!!! @LABRCNTIP (procalcitonin:4,lacticidven:4) )No results found for this or any previous visit (from the past 240 hour(s)).   Radiological Exams on Admission: Dg Chest 2 View  Result Date: 04/08/2016 CLINICAL DATA:  Epigastric abdominal pain. EXAM: CHEST  2 VIEW COMPARISON:  Radiograph of February 20, 2016. FINDINGS: Stable cardiomediastinal silhouette. Atherosclerosis of thoracic aorta is noted. Sternotomy wires are noted. No pneumothorax is noted. Minimal right basilar subsegmental atelectasis is noted. Mild left basilar atelectasis or infiltrate is noted with mild left pleural effusion. Bony thorax is unremarkable. IMPRESSION: Aortic atherosclerosis. Mild left basilar atelectasis or infiltrate is noted with mild left pleural effusion. Electronically Signed   By: Marijo Conception, M.D.   On:  04/08/2016 15:24   Ct Abdomen Pelvis W Contrast  Result Date: 04/08/2016 CLINICAL DATA:  80 year old male with prolonged hospitalization and rehab this summer. Acute abdominal pain today. Initial encounter. EXAM: CT ABDOMEN AND PELVIS WITH CONTRAST TECHNIQUE: Multidetector CT imaging of the abdomen and pelvis was performed using the standard protocol following bolus administration of intravenous contrast. CONTRAST:  164mL ISOVUE-300 IOPAMIDOL (ISOVUE-300) INJECTION 61% COMPARISON:  Noncontrast CT Abdomen and Pelvis 08/30/2015 and earlier. CTA abdomen and pelvis 05/18/2012. FINDINGS: Lower chest: Small to moderate layering left and small layering right pleural effusions are new since February. Associated compressive atelectasis at both lung bases. There are some air bronchograms in the right lower lobe. There is also mild consolidation in the lingula with some air bronchograms. Cardiomegaly. No pericardial effusion. Extensive coronary artery calcified atherosclerosis status post CABG. No upper abdominal free air. Hepatobiliary: Stable liver with multiple benign-appearing low-density areas. Chronic absence of the gallbladder. Intra and extrahepatic biliary ducts appear stable. Pancreas: Somewhat atrophied and stable. Spleen: Negative. Adrenals/Urinary Tract: Negative adrenal glands. A mild degree of bilateral renal atrophy is noted. Renal contrast enhancement and excretion is within normal limits. Diminutive urinary bladder which may explain the appearance of bladder wall thickening. Stomach/Bowel: Diffusely abnormal large bowel wall thickening. Much of the inflamed colon is also decompressed. Mucosa seems to be hyperenhancing throughout much of the affected segments. Involvement from the cecum to the rectum is noted. Numerous fluid-filled  small bowel loops, but no definite dilated small bowel. Decompressed stomach and duodenum. Superimposed diffuse mesenteric haziness suggesting mesenteric edema, but this may be  secondary to anasarca described below. Vascular/Lymphatic: Severe diffuse calcified atherosclerosis of the aorta, all of its major branches, the pelvic arteries and proximal femoral arteries. No definite major arterial branch occlusion is identified. No definite lymphadenopathy. Reproductive: Negative. Other: Diffuse subcutaneous fat stranding in keeping with anasarca. Small volume of pelvic free fluid about the rectum.7 there appears to be a a fluid containing right femoral hernia which is new (series 3, image 87). Musculoskeletal: No acute osseous abnormality identified. IMPRESSION: 1. Diffuse acute colitis. Favor infectious colitis and consider C. difficile colitis. 2. Superimposed anasarca. Moderate layering left and small layering right pleural effusions. Associated lung base atelectasis but difficult to exclude developing pneumonia in the lingula and at the right lung base. 3. Chronic severe diffuse calcified atherosclerosis throughout the abdomen and pelvis. No definite large vessel occlusion. 4. Small fluid containing right femoral hernia suspected and new since February. Electronically Signed   By: Genevie Ann M.D.   On: 04/08/2016 16:00   Both CT scans personally reviewed by myself  EKG: Independently reviewed. T waves in lateral leads, new compared to prior EKGs  Assessment/Plan Active Problems:   PAF (paroxysmal atrial fibrillation) (HCC)   Chronic diastolic heart failure (HCC)   Diabetes mellitus ([place])   Essential hypertension   1. Acute colitis 1. Noted on CT scan, personally reviewed by myself 2. C. difficile ordered, pending 3. For the time being, continue patient on empiric ciprofloxacin and Flagyl 4. We'll keep nothing by mouth for now 2. Hypertensive crisis 1. Blood pressure markedly elevated.  2. We'll continue with when necessary hydralazine IV 3. Diabetes 1. We'll continue patient on supplemental sliding scale insulin 4. Abnormal EKG 1. New T-wave inversions in lateral  leads 2. We'll follow serial troponins 3. Discussed case with Cardiology on call. Consideration for ekg changes related to poorly controlled HTN with acute colitis. Cardiology recommendation to control BP and treat colitis 4. If trop rises or patient develops chest pain, consider formal consultation at that time 5. Have ordered 2-D echocardiogram 5. CAD status post CABG 1. Patient followed by Dr. Tamala Julian as outpatient 2. Outpatient documentation reviewed by myself.  3. Patient is status post CABG in the late 1990s with revision CABG in 2013. 4. Per above, follow serial troponin 5. Monitor on telemetry 6. Paroxysmal atrial fibrillation 1. Rate controlled at present 2. Not candidate for therapeutic anticoagulation 7. Leukocytosis 1. Suspect secondary to presenting colitis 8. Atelectasis versus pneumonia 1. Seen on chest x-ray was personally reviewed by myself 2. We'll focus on colitis for the time being. 3. Sitter interval chest x-ray to evaluate for infectious process as needed  DVT prophylaxis: Heparin subcutaneous  Code Status: Full code Family Communication: Patient in room  Disposition Plan: Uncertain at this time  Consults called:  Admission status: Med telemetry   CHIU, Orpah Melter MD [facility] Pager [phone]  If 7PM-7AM, please contact night-coverage [email or web address] Password [facility]  04/08/2016, 5:34 PM

## 2016-04-08 NOTE — Progress Notes (Signed)
Patient ID: [name] Brow., male   DOB: 08-21-1935, 80 y.o.   MRN: [id number]   This is an acute visit.  Level care skilled.  [place]  Chief complaint-acute visit secondary to abdominal pain and abdominal distention.  History of present illness.  Patient is a pleasant 80 year old male with a very complex medical history-he was admitted to this facility in mid-August from [facility] with new onset of seizure and acute blood loss anemia from hematuria-hemoglobin was actually 3.4.  He required transfusion.  He had cystoscopy with clot evacuation and fulguration on 02/22/2016.  Electrolytes were repleted clonidine was discontinued Coreg dose decreased.  Apparently patient had been in his usual state of health and feeling stronger-however this has not been a good morning for him.  Apparently he's been complaining of increasing abdominal pain.  He is also experiencing increased abdominal distention.  Nursing staff thought he did have some oxygen desaturation this morning and he was started on oxygen he is not complaining of any chest pain or shortness of breath currently.  He is complaining of persistent abdominal discomfort which appears to be generalized-he says this has gotten somewhat worse over the course the morning  He says he had what he thought was regular bowel movement this morning but did not really get any relief   I do note he does have a previous history of C. difficile-ischemic colitis-and gastroparesis is status post partial gastrectomy in 1969.  Currently his vital signs appear to be relatively stable but his main complaint is increasing abdominal discomfort  PAST MEDICAL HISTORY:      Past Medical History:  Diagnosis Date  . Angiomyolipoma 2009   On both kidneys noted in 2009  . Arthritis    neck and left wrist  . Atrial fibrillation ([place])   . CAD (coronary artery disease)    s/b CABG 1994, and subsequent stents. Repeat  CABG 12/2011,  . Chronic diastolic heart failure ([place])   . CKD (chronic kidney disease)   . Clostridium difficile diarrhea 03/2016  . Depressive disorder   . Diabetes mellitus type 2 with peripheral artery disease (H[facility])    DIET CONTROLLED  . DVT (deep venous thrombosis) ([place]) 2011   Right arm  . Gastroparesis   . GERD (gastroesophageal reflux disease)   . Gout   . History of hiatal hernia   . Hyperlipidemia   . Hypertension   . Internal hemorrhoids without mention of complication   . Ischemic colitis ([place])   . 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 Myelodysplastic syndrome ([place]) 05/22/2013   With low hemoglobin and platelets treated with Procrit  . Osteopenia   . Peptic ulcer    S/p partial gastrectomy in 1969  . Peripheral artery disease ([place])   . Pneumonia 01/16/2016  . Pneumonia 03/2016  . Presence of permanent cardiac pacemaker   . Prostate cancer ([place]) 1997   XRT and lupron  . Renal artery stenosis ([place])   . Sick sinus syndrome ([place])   . Vitamin B 12 deficiency      CURRENT MEDICATIONS: Reviewed      Patient's Medications  New Prescriptions   No medications on file  Previous Medications   ACETAMINOPHEN (ARTHRITIS PAIN RELIEF) 650 MG CR TABLET    Take 650 mg by mouth 3 (three) times daily.    AMIODARONE (PACERONE) 200 MG TABLET    Take one-half tablet by  mouth daily   ATORVASTATIN (LIPITOR) 80 MG TABLET    Take  80 mg by mouth at bedtime.    CALCIFEDIOL ER (RAYALDEE) 30 MCG CPCR    Take 30 mcg by mouth at bedtime.   CARVEDILOL (COREG) 12.5 MG TABLET    Take 12.5 mg by mouth 2 (two) times daily with a meal. Give a one time dose of 6.25 today at 1PM (03/31/16)   CYANOCOBALAMIN (,VITAMIN B-12,) 1000 MCG/ML INJECTION    Inject 1,000 mcg into the muscle every 30 (thirty) days. Vitamin B12 - last injection 09/01/15   EPOETIN ALFA (EPOGEN,PROCRIT) 02725 UNIT/ML INJECTION    Inject 10,000 Units into the skin every 14 (fourteen) days. Done at [facility] - last injection 09/22/15   FERROUS SULFATE 325 (65 FE) MG TABLET    Take 1 tablet (325 mg total) by mouth 3 (three) times daily with meals.   FUROSEMIDE (LASIX) 80 MG TABLET    Take 1 tablet (80 mg total) by mouth daily.   HYDRALAZINE (APRESOLINE) 25 MG TABLET    Take 3 tablets (75 mg total) by mouth 4 (four) times daily.   INSULIN ASPART (NOVOLOG) 100 UNIT/ML INJECTION    Inject 0-9 Units into the skin. SSI:  121-150 = 1 unit, 151-200 = 2 units, 201-250 = 3 units, 251-300 = 5 units, 301-350 = 7 units, 351-400 = 9 units   ISOSORBIDE MONONITRATE (IMDUR) 120 MG 24 HR TABLET    Take 1 tablet by mouth  daily   LEUPROLIDE (LUPRON) 30 MG INJECTION    Inject 30 mg into the muscle every 4 (four) months.   MULTIPLE VITAMIN (MULTIVITAMIN WITH MINERALS) TABS TABLET    Take 1 tablet by mouth every morning.   PANTOPRAZOLE (PROTONIX) 40 MG TABLET    Take 1 tablet by mouth daily.    POTASSIUM CHLORIDE SA (K-DUR,KLOR-CON) 20 MEQ TABLET    Take 20-40 mEq by mouth. Give 40 mEq BID and 20 mEq qhs   SENNA-DOCUSATE (SENOKOT-S) 8.6-50 MG TABLET    Take 2 tablets by mouth 2 (two) times daily.   SILODOSIN (RAPAFLO) 8 MG CAPS CAPSULE    Take 8 mg by mouth at bedtime.    SODIUM BICARBONATE 650 MG TABLET    Take 1 tablet (650 mg total) by mouth 2 (two) times daily.   UNABLE TO FIND    Med Name: Med pass 120 mL 2 times daily  Modified Medications   No medications on file  Discontinued Medications   CARVEDILOL (COREG) 6.25 MG TABLET    Take 1 tablet (6.25 mg total) by mouth 2 (two) times daily with a meal.   COLCHICINE 0.6 MG TABLET    Take 0.5 tablets (0.3 mg total) by mouth daily.   SACCHAROMYCES BOULARDII (FLORASTOR) 250 MG CAPSULE    Take 250 mg by mouth 2 (two) times daily.          Allergies  Allergen Reactions  . Nsaids Nausea Only    GI issue  . Ibuprofen Other (See Comments)    GI Issues  . Ace Inhibitors Cough     REVIEW OF SYSTEMS:  GENERAL:Says his appetite is  poor today secondary to abdominal discomfort SKIN: Denies rash, itching, wounds, ulcer sores, or nail abnormality EYES: Denies change in vision, dry eyes, eye pain, itching or discharge EARS: Denies change in hearing,  NOSE: Denies nasal congestion or epistaxis MOUTH and THROAT: Denies oral discomfort, gingival pain or bleeding, pain from teeth or hoarseness   RESPIRATORY: no cough, SOB, DOE, wheezing, hemoptysis CARDIAC: no chest pain,hsd baseline  edema or palpitations GI:s increasing abdominal pain and distention-says he did have a bowel movement this morning with no relief however GU: Denies dysuria, frequency, hematuria, incontinence, or discharge PSYCHIATRIC: Denies feeling of depression or anxiety. No report of hallucinations, insomnia, paranoia, or agitation    PHYSICAL EXAMINATION Temperature is 98.0 pulse 84 respirations 18 blood pressure 143/91 O2 saturation 97% currently on room air GENERAL APPEARANCE: Well nourished. In no acute distress. Normal body habitus SKIN:  Skin is warm and dry.  HEAD: Normal in size and contour. No evidence of trauma EYES: Lids open and close normally. No blepharitis, entropion or ectropion. PERRL. Conjunctivae are clear and sclerae are white. Lenses are without opacity   MOUTH and THROAT: Lips are without lesions. Oral mucosa is moist and without lesions.    RESPIRATORY: breathing is even & unlabored, could not really appreciate overt congestion CARDIAC: RRR, no murmur,no extra heart sounds, BLE 2+ edema compression hose applied GI: abdomen is firm and diffusely tender to palpation with significant distention-bowel sounds are  hypoactive -he has a well-healed midline abdominal scar EXTREMITIES:  Able to move X 4 extremities in a wheelchair PSYCHIATRIC: Alert and oriented X 3. Affect and behavior are appropriate  LABS/RADIOLOGY: Labs reviewed: Basic Metabolic Panel:  Recent Labs (within last 365 days)   Recent Labs  02/01/16 0450  02/02/16 0553 02/02/16 0554 02/03/16 0601  02/21/16 0627 02/23/16 0500 02/24/16 0400  03/19/16 03/25/16 03/30/16  NA 139 139  --  139  < > 144 142 138  < > 142 143 142  K 3.3* 3.3*  --  3.8  < > 3.8 3.2* 3.4*  < > 3.7 3.7 3.4  CL 110 109  --  109  < > 110 113* 110  --   --   --   --   CO2 21* 23  --  22  < > 23 21* 22  --   --   --   --   GLUCOSE 97 145*  --  118*  < > 113* 115* 115*  --   --   --   --   BUN 89* 82*  --  76*  < > 32* 29* 31*  < > 21 25* 27*  CREATININE 4.04* 3.73*  --  3.61*  < > 2.05* 2.00* 1.96*  < > 1.7* 1.9* 1.9*  CALCIUM 8.2* 8.1*  --  8.2*  < > 7.7* 7.7* 7.7*  --   --   --   --   MG  --   --  1.8  --   --   --   --   --   --   --   --   --   PHOS 3.9 3.5  --  4.1  --   --   --   --   --   --   --   --   < > = values in this interval not displayed.   Liver Function Tests:  Recent Labs (within last 365 days)   Recent Labs  01/16/16 1239  02/03/16 0601 02/20/16 1430 02/21/16 0627 03/01/16 1357  AST 30  --   --  27 21 25   ALT 12*  --   --  6* <5* 7*  ALKPHOS 27*  --   --  44 37* 43  BILITOT 0.7  --   --  0.2* 0.7  --   PROT 7.6  --   --  5.4* 5.1*  --  ALBUMIN 4.2  < > 1.9* 2.2* 2.1*  --   < > = values in this interval not displayed.    CBC:  Recent Labs (within last 365 days)   Recent Labs  02/24/16 0400  03/08/16 1346  03/25/16 03/29/16 1027 03/30/16  WBC 8.7  < > 7.4  < > 10.4 8.1 7.4  NEUTROABS  --   < > 5.7  < > 6 6.0 4  HGB 7.6*  < > 9.1*  < > 9.5* 8.9* 8.8*  HCT 23.2*  < > 28.5*  < > 30* 27.6* 28*  MCV 97.1  --  93.1  --   --  92.0  --   PLT 100*  < > 179  < > 177 158 158  < > = values in this interval not displayed.   Cardiac Enzymes:  Recent Labs (within last 365 days)   Recent Labs  01/25/16 1519 02/20/16 1816 02/21/16 0627  CKTOTAL 60  --   --   TROPONINI  --  0.04* 0.05*     CBG:  Recent Labs (within last 365 days)   Recent Labs  02/23/16 2158 02/24/16 0747 02/24/16 1620  GLUCAP 188* 114* 195*        Assessment and plan.  History of increased abdominal distention with abdominal pain-we'll send to the ER for expedient evaluation at this point does not appear to be in acute distress but does appear to be somewhat uncomfortable-vital signs are relatively stable-states he cannot really eat because of the abdominal discomfort-of note did state he had a bowel movement this morning that appeared be fairly regular but did not get any relief.  Will wait ER evaluation.  [id number]  .

## 2016-04-08 NOTE — ED Provider Notes (Signed)
[facility] DEPT Provider Note   CSN: [id number] Arrival date & time: 04/08/16  1427     History   Chief Complaint Chief Complaint  Patient presents with  . Abdominal Pain    HPI [name] Saed. is a 80 y.o. male.  HPI   Patient is a very pleasant 80 year old male who was about to be discharged from rehabilitation to his home tomorrow. Today he developed abdominal pain. Patient had abdominal pain like this in the past. He says it feels about the same. He has history of gastroparesis. Patient said that he told the providers at his nursing home and they made him come here to emergency department. Additionally he reports that he had an episode of shortness of breath. That has since resolved. He said he they placed him on a bit of oxygen which helped.  Patient has had a prolonged course of hospitalization and rehabilitation this whole summer. His most recent medical problems have been for new-onset seizure and acute blood loss anemia from hematuria.  Patient has had no hematuria recently. No blood in the stool. No blood in his vomit. Patient has not had any vomiting. He feels mild nausea and this mild abdominal pain.  Past Medical History:  Diagnosis Date  . Angiomyolipoma 2009   On both kidneys noted in 2009  . Arthritis    neck and left wrist  . Atrial fibrillation ([place])   . CAD (coronary artery disease)    s/b CABG 1994, and subsequent stents. Repeat CABG 12/2011,  . Chronic diastolic heart failure ([place])   . CKD (chronic kidney disease)   . Clostridium difficile diarrhea 03/2016  . Depressive disorder   . Diabetes mellitus type 2 with peripheral artery disease (HCC)    DIET CONTROLLED  . DVT (deep venous thrombosis) ([place]) 2011   Right arm  . Gastroparesis   . GERD (gastroesophageal reflux disease)   . Gout   . History of hiatal hernia   . Hyperlipidemia   . Hypertension   . Internal hemorrhoids without mention of complication   . Ischemic colitis ([place])   .  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 Myelodysplastic syndrome ([place]) 05/22/2013   With low hemoglobin and platelets treated with Procrit  . Osteopenia   . Peptic ulcer    S/p partial gastrectomy in 1969  . Peripheral artery disease ([place])   . Pneumonia 01/16/2016  . Pneumonia 03/2016  . Presence of permanent cardiac pacemaker   . Prostate cancer ([place]) 1997   XRT and lupron  . Renal artery stenosis ([place])   . Sick sinus syndrome ([place])   . Vitamin B 12 deficiency     Patient Active Problem List   Diagnosis Date Noted  . Chronic kidney disease, stage III (moderate) 03/09/2016  . Pressure ulcer 02/23/2016  . Seizure ([place]) 02/20/2016  . Palliative care encounter   . Goals of care, counseling/discussion   . Arterial hypotension   . Pacemaker infection ([place])   . Acute renal failure superimposed on stage 4 chronic kidney disease ([place]) 01/26/2016  . Bacteremia due to Staphylococcus aureus   . Pseudogout involving multiple joints 01/24/2016  . Bladder mass 01/24/2016  . Hemoptysis   . Pain   . Left upper extremity swelling   . Staphylococcus aureus bacteremia 01/17/2016  . CAP (community acquired pneumonia) 01/16/2016  . S/P hernia repair 10/28/2015  . Right inguinal hernia 09/09/2015  . GIB (gastrointestinal bleeding) 08/29/2015  . Hematuria 05/19/2015  . Thrombocytopenia ([place]) 11/11/2014  .  Leukopenia 11/11/2014  . B12 deficiency 08/31/2013  . Pacemaker 08/26/2013  . On amiodarone therapy 08/21/2013  . Gout due to renal impairment, left ankle and foot 06/13/2013    Class: Acute  . CAD of autologous bypass graft 06/05/2013  . MDS (myelodysplastic syndrome) ([place]) 05/22/2013  . Anemia of chronic disease 03/19/2013  . CKD (chronic kidney disease), stage IV ([place]) 03/18/2013  . NSVT (nonsustained ventricular tachycardia) ([place]) 03/18/2013  . Chronic diastolic heart failure ([place]) 03/17/2013  . Diabetes mellitus ([place]) 03/17/2013  . Essential hypertension 03/17/2013  . Mesenteric artery  stenosis ([place]) 05/01/2012  . PAF (paroxysmal atrial fibrillation) ([place]) 12/24/2011  . PAD (peripheral artery disease) ([place]) 12/24/2011  . Chronic vascular insufficiency of intestine ([place]) 12/20/2011    Past Surgical History:  Procedure Laterality Date  . ABDOMINAL ANGIOGRAM N/A 05/25/2011   Procedure: ABDOMINAL ANGIOGRAM;  Surgeon: Serafina Mitchell, MD;  Location: [facility] CATH LAB;  Service: Cardiovascular;  Laterality: N/A;  . ABDOMINAL ANGIOGRAM N/A 02/13/2013   Procedure: ABDOMINAL ANGIOGRAM;  Surgeon: Serafina Mitchell, MD;  Location: [facility] CATH LAB;  Service: Cardiovascular;  Laterality: N/A;  . APPENDECTOMY  1991  . CELIAC ARTERY ANGIOPLASTY  05-16-12   and stenting  . CHOLECYSTECTOMY  Oct 2009   Laparoscopic  . CORONARY ARTERY BYPASS GRAFT  01/22/1993  . CORONARY ARTERY BYPASS GRAFT  01/03/2012   Procedure: REDO CORONARY ARTERY BYPASS GRAFTING (CABG);  Surgeon: Gaye Pollack, MD;  Location: [facility];  Service: Open Heart Surgery;  Laterality: N/A;  Redo CABG x  using bilateral internal mammary arteries;  left leg greater saphenous vein harvested endoscopically  . FOREIGN BODY REMOVAL ABDOMINAL Right 01/27/2016   Procedure: EXPOSURE OF RIGHT COMMON FEMORAL ARTERY AND REMOVAL FOREIGN;  Surgeon: Evans Lance, MD;  Location: [facility];  Service: Cardiovascular;  Laterality: Right;  . HIATAL HERNIA REPAIR     and ulcer repair  . I&D EXTREMITY Left 01/21/2016   Procedure: IRRIGATION AND DEBRIDEMENT EXTREMITY;  Surgeon: Milly Jakob, MD;  Location: [facility];  Service: Orthopedics;  Laterality: Left;  . INGUINAL HERNIA REPAIR Right 10/28/2015   Procedure: OPEN RIGHT INGUINAL HERNIA REPAIR;  Surgeon: Greer Pickerel, MD;  Location: [facility];  Service: General;  Laterality: Right;  . INSERTION OF MESH Right 10/28/2015   Procedure: INSERTION OF MESH;  Surgeon: Greer Pickerel, MD;  Location: [facility];  Service: General;  Laterality: Right;  . IR GENERIC HISTORICAL  02/02/2016   IR FLUORO GUIDE CV LINE LEFT 02/02/2016 Markus Daft,  MD [facility]  . IR GENERIC HISTORICAL  02/02/2016   IR US GUIDE VASC ACCESS LEFT 02/02/2016 Markus Daft, MD [facility]  . IR GENERIC HISTORICAL  03/31/2016   IR REMOVAL TUN CV CATH W/O FL 03/31/2016 Arne Cleveland, MD [facility]  . LEFT HEART CATHETERIZATION WITH CORONARY/GRAFT ANGIOGRAM  12/24/2011   Procedure: LEFT HEART CATHETERIZATION WITH Beatrix Fetters;  Surgeon: Sinclair Grooms, MD;  Location: [facility] CATH LAB;  Service: Cardiovascular;;  . LOWER EXTREMITY ANGIOGRAM Bilateral 05/25/2011   Procedure: LOWER EXTREMITY ANGIOGRAM;  Surgeon: Serafina Mitchell, MD;  Location: [facility] CATH LAB;  Service: Cardiovascular;  Laterality: Bilateral;  . OTHER SURGICAL HISTORY  02/13/13   superior mesenteric artery angiogram  . OTHER SURGICAL HISTORY  05/16/12   Stent in stomach  . PACEMAKER GENERATOR CHANGE  12/10/2003   SJM Identity XL DR performed by Dr Leonia Reeves  . PACEMAKER INSERTION  10/18/1994   DDD pacemaker, [employer] Jude. Gen change 12/10/2003.  Marland Kitchen  PACEMAKER LEAD REMOVAL Right 01/27/2016   Procedure: PACEMAKER EXTRACTION;  Surgeon: Evans Lance, MD;  Location: [facility];  Service: Cardiovascular;  Laterality: Right;  DR. Roxy Manns TO BACKUP CASE  . PARTIAL GASTRECTOMY  1969   Hx of ulcer s/p partial gastrectomy/ has pernicious anemia  . RENAL ANGIOGRAM N/A 02/13/2013   Procedure: RENAL ANGIOGRAM;  Surgeon: Serafina Mitchell, MD;  Location: [facility] CATH LAB;  Service: Cardiovascular;  Laterality: N/A;  . TEE WITHOUT CARDIOVERSION N/A 01/26/2016   Procedure: TRANSESOPHAGEAL ECHOCARDIOGRAM (TEE);  Surgeon: Sueanne Margarita, MD;  Location: [facility] ENDOSCOPY;  Service: Cardiovascular;  Laterality: N/A;  . TEE WITHOUT CARDIOVERSION N/A 01/27/2016   Procedure: TRANSESOPHAGEAL ECHOCARDIOGRAM (TEE);  Surgeon: Evans Lance, MD;  Location: [facility];  Service: Cardiovascular;  Laterality: N/A;  . THROMBECTOMY / EMBOLECTOMY SUBCLAVIAN ARTERY  02/02/10   Right subclavian thromboectomy and venous angioplasty, and chronic mesenteric ischemia  with Herculink stenting to superior mesenteric and celiac arteries - Dr. Trula Slade  . TRANSURETHRAL RESECTION OF PROSTATE N/A 02/22/2016   Procedure: CYSTO, CLOT EVACUATION, FULGERATION;  Surgeon: Alexis Frock, MD;  [facility];  Service: Urology;  Laterality: N/A;  . VISCERAL ANGIOGRAM N/A 05/25/2011   Procedure: VISCERAL ANGIOGRAM;  Surgeon: Serafina Mitchell, MD;  Location: [facility] CATH LAB;  Service: Cardiovascular;  Laterality: N/A;  . VISCERAL ANGIOGRAM Bilateral 12/28/2011   Procedure: VISCERAL ANGIOGRAM;  Surgeon: Serafina Mitchell, MD;  Location: [facility] CATH LAB;  Service: Cardiovascular;  Laterality: Bilateral;  . VISCERAL ANGIOGRAM N/A 05/16/2012   Procedure: VISCERAL ANGIOGRAM;  Surgeon: Serafina Mitchell, MD;  Location: [facility] CATH LAB;  Service: Cardiovascular;  Laterality: N/A;  . VISCERAL ANGIOGRAM N/A 02/13/2013   Procedure: VISCERAL ANGIOGRAM;  Surgeon: Serafina Mitchell, MD;  Location: [facility] CATH LAB;  Service: Cardiovascular;  Laterality: N/A;       Home Medications    Prior to Admission medications   Medication Sig Start Date End Date Taking? Authorizing Provider  acetaminophen (ARTHRITIS PAIN RELIEF) 650 MG CR tablet Take 650 mg by mouth 3 (three) times daily.     Historical Provider, MD  amiodarone (PACERONE) 200 MG tablet Take one-half tablet by  mouth daily 01/07/16   Belva Crome, MD  atorvastatin (LIPITOR) 80 MG tablet Take 80 mg by mouth at bedtime.     Historical Provider, MD  Calcifediol ER (RAYALDEE) 30 MCG CPCR Take 30 mcg by mouth at bedtime.    Historical Provider, MD  carvedilol (COREG) 12.5 MG tablet Take 12.5 mg by mouth 2 (two) times daily with a meal. Give a one time dose of 6.25 today at 1PM (03/31/16)    Historical Provider, MD  cyanocobalamin (,VITAMIN B-12,) 1000 MCG/ML injection Inject 1,000 mcg into the muscle every 30 (thirty) days. Vitamin B12 - last injection 09/01/15    Historical Provider, MD  epoetin alfa (EPOGEN,PROCRIT) 91478 UNIT/ML injection Inject 10,000 Units  into the skin every 14 (fourteen) days. Done at [facility] - last injection 09/22/15    Historical Provider, MD  ferrous sulfate 325 (65 FE) MG tablet Take 1 tablet (325 mg total) by mouth 3 (three) times daily with meals. 02/24/16   Venetia Maxon Rama, MD  furosemide (LASIX) 80 MG tablet Take 1 tablet (80 mg total) by mouth daily. 02/24/16   Venetia Maxon Rama, MD  hydrALAZINE (APRESOLINE) 25 MG tablet Take 3 tablets (75 mg total) by mouth 4 (four) times daily. 02/24/16   Venetia Maxon Rama, MD  insulin aspart (NOVOLOG) 100  UNIT/ML injection Inject 0-9 Units into the skin. SSI:  121-150 = 1 unit, 151-200 = 2 units, 201-250 = 3 units, 251-300 = 5 units, 301-350 = 7 units, 351-400 = 9 units    Historical Provider, MD  isosorbide mononitrate (IMDUR) 120 MG 24 hr tablet Take 1 tablet by mouth  daily 01/07/16   Belva Crome, MD  leuprolide (LUPRON) 30 MG injection Inject 30 mg into the muscle every 4 (four) months.    Historical Provider, MD  Multiple Vitamin (MULTIVITAMIN WITH MINERALS) TABS tablet Take 1 tablet by mouth every morning.    Historical Provider, MD  pantoprazole (PROTONIX) 40 MG tablet Take 1 tablet by mouth daily.  01/14/16   Historical Provider, MD  potassium chloride SA (K-DUR,KLOR-CON) 20 MEQ tablet Take 20-40 mEq by mouth. Give 40 mEq BID and 20 mEq qhs    Historical Provider, MD  senna-docusate (SENOKOT-S) 8.6-50 MG tablet Take 2 tablets by mouth 2 (two) times daily. 02/03/16   Silver Huguenin Elgergawy, MD  silodosin (RAPAFLO) 8 MG CAPS capsule Take 8 mg by mouth at bedtime.     Historical Provider, MD  sodium bicarbonate 650 MG tablet Take 1 tablet (650 mg total) by mouth 2 (two) times daily. 02/03/16   Albertine Patricia, MD  UNABLE TO FIND Med Name: Med pass 120 mL 2 times daily    Historical Provider, MD    Family History Family History  Problem Relation Age of Onset  . Diabetes Mother   . Heart disease Mother     Heart Disease before age 60  . Hyperlipidemia Mother   . Hypertension  Mother   . CVA Mother 59    cause of death  . Cancer Father     stomach/liver  . Hypertension Father     possibly hypertensive  . Cancer Sister     Breast cancer  . CAD Brother   . Heart disease Brother   . Cancer Paternal Uncle     colon  . Heart disease Sister   . Diabetes Sister   . Hypertension Sister   . Heart disease Daughter     Heart Disease before age 13  . Hypertension Daughter   . Heart attack Daughter     Social History Social History  Substance Use Topics  . Smoking status: Former Smoker    Years: 20.00    Types: Cigarettes    Quit date: 07/12/1969  . Smokeless tobacco: Never Used  . Alcohol use No     Allergies   Nsaids; Ibuprofen; and Ace inhibitors   Review of Systems Review of Systems  Constitutional: Negative for activity change, fatigue and fever.  Respiratory: Positive for shortness of breath.   Cardiovascular: Negative for chest pain.  Gastrointestinal: Positive for abdominal pain. Negative for diarrhea and vomiting.  All other systems reviewed and are negative.    Physical Exam Updated Vital Signs BP (!) 217/97 (BP Location: Left Arm) Comment: Ikrame B, RN at bedside  Pulse 88   Temp 98.3 F (36.8 C) (Oral)   Resp 15   SpO2 92%   Physical Exam  Constitutional: He appears well-developed and well-nourished.  HENT:  Head: Normocephalic and atraumatic.  Eyes: Conjunctivae are normal.  Neck: Neck supple.  Cardiovascular: Normal rate.   No murmur heard. Irregularly irregular  Pulmonary/Chest: Effort normal and breath sounds normal. No respiratory distress.  Slight tachypnea.  Abdominal: There is tenderness.  Patient tenderness to light palpation over the epigastric region.  Musculoskeletal: He  exhibits no edema.  Neurological: He is alert. No cranial nerve deficit.  Skin: Skin is warm and dry.  Psychiatric: He has a normal mood and affect.  Nursing note and vitals reviewed.    ED Treatments / Results  Labs (all labs ordered  are listed, but only abnormal results are displayed) Labs Reviewed  COMPREHENSIVE METABOLIC PANEL - Abnormal; Notable for the following:       Result Value   Glucose, Bld 152 (*)    BUN 22 (*)    Creatinine, Ser 1.59 (*)    Calcium 8.8 (*)    Albumin 3.4 (*)    ALT 10 (*)    GFR calc non Af Amer 40 (*)    GFR calc Af Amer 46 (*)    All other components within normal limits  CBC - Abnormal; Notable for the following:    WBC 12.3 (*)    RBC 3.64 (*)    Hemoglobin 11.0 (*)    HCT 35.3 (*)    RDW 23.6 (*)    All other components within normal limits  URINE CULTURE  C DIFFICILE QUICK SCREEN W PCR REFLEX  LIPASE, BLOOD  PROTIME-INR  URINALYSIS, ROUTINE W REFLEX MICROSCOPIC (NOT AT [facility])  I-STAT TROPOININ, ED    EKG  EKG Interpretation  Date/Time:  Thursday April 08 2016 15:58:51 EDT Ventricular Rate:  88 PR Interval:    QRS Duration: 94 QT Interval:  397 QTC Calculation: 481 R Axis:   89 Text Interpretation:  Sinus rhythm Borderline right axis deviation Borderline low voltage, extremity leads T wave abnormality , new Abnormal ekg Confirmed by Carmin Muskrat  MD [phone]) on 04/08/2016 4:14:55 PM       Radiology Dg Chest 2 View  Result Date: 04/08/2016 CLINICAL DATA:  Epigastric abdominal pain. EXAM: CHEST  2 VIEW COMPARISON:  Radiograph of February 20, 2016. FINDINGS: Stable cardiomediastinal silhouette. Atherosclerosis of thoracic aorta is noted. Sternotomy wires are noted. No pneumothorax is noted. Minimal right basilar subsegmental atelectasis is noted. Mild left basilar atelectasis or infiltrate is noted with mild left pleural effusion. Bony thorax is unremarkable. IMPRESSION: Aortic atherosclerosis. Mild left basilar atelectasis or infiltrate is noted with mild left pleural effusion. Electronically Signed   By: Marijo Conception, M.D.   On: 04/08/2016 15:24   Ct Abdomen Pelvis W Contrast  Result Date: 04/08/2016 CLINICAL DATA:  80 year old male with prolonged  hospitalization and rehab this summer. Acute abdominal pain today. Initial encounter. EXAM: CT ABDOMEN AND PELVIS WITH CONTRAST TECHNIQUE: Multidetector CT imaging of the abdomen and pelvis was performed using the standard protocol following bolus administration of intravenous contrast. CONTRAST:  159mL ISOVUE-300 IOPAMIDOL (ISOVUE-300) INJECTION 61% COMPARISON:  Noncontrast CT Abdomen and Pelvis 08/30/2015 and earlier. CTA abdomen and pelvis 05/18/2012. FINDINGS: Lower chest: Small to moderate layering left and small layering right pleural effusions are new since February. Associated compressive atelectasis at both lung bases. There are some air bronchograms in the right lower lobe. There is also mild consolidation in the lingula with some air bronchograms. Cardiomegaly. No pericardial effusion. Extensive coronary artery calcified atherosclerosis status post CABG. No upper abdominal free air. Hepatobiliary: Stable liver with multiple benign-appearing low-density areas. Chronic absence of the gallbladder. Intra and extrahepatic biliary ducts appear stable. Pancreas: Somewhat atrophied and stable. Spleen: Negative. Adrenals/Urinary Tract: Negative adrenal glands. A mild degree of bilateral renal atrophy is noted. Renal contrast enhancement and excretion is within normal limits. Diminutive urinary bladder which may explain the appearance of bladder wall  thickening. Stomach/Bowel: Diffusely abnormal large bowel wall thickening. Much of the inflamed colon is also decompressed. Mucosa seems to be hyperenhancing throughout much of the affected segments. Involvement from the cecum to the rectum is noted. Numerous fluid-filled small bowel loops, but no definite dilated small bowel. Decompressed stomach and duodenum. Superimposed diffuse mesenteric haziness suggesting mesenteric edema, but this may be secondary to anasarca described below. Vascular/Lymphatic: Severe diffuse calcified atherosclerosis of the aorta, all of its  major branches, the pelvic arteries and proximal femoral arteries. No definite major arterial branch occlusion is identified. No definite lymphadenopathy. Reproductive: Negative. Other: Diffuse subcutaneous fat stranding in keeping with anasarca. Small volume of pelvic free fluid about the rectum.7 there appears to be a a fluid containing right femoral hernia which is new (series 3, image 87). Musculoskeletal: No acute osseous abnormality identified. IMPRESSION: 1. Diffuse acute colitis. Favor infectious colitis and consider C. difficile colitis. 2. Superimposed anasarca. Moderate layering left and small layering right pleural effusions. Associated lung base atelectasis but difficult to exclude developing pneumonia in the lingula and at the right lung base. 3. Chronic severe diffuse calcified atherosclerosis throughout the abdomen and pelvis. No definite large vessel occlusion. 4. Small fluid containing right femoral hernia suspected and new since February. Electronically Signed   By: Genevie Ann M.D.   On: 04/08/2016 16:00    Procedures Procedures (including critical care time)  Medications Ordered in ED Medications  iopamidol (ISOVUE-300) 61 % injection 100 mL (100 mLs Intravenous Contrast Given 04/08/16 1522)     Initial Impression / Assessment and Plan / ED Course  I have reviewed the triage vital signs and the nursing notes.  Pertinent labs & imaging results that were available during my care of the patient were reviewed by me and considered in my medical decision making (see chart for details).  Clinical Course    Patient is a pleasant 80 year old gentleman with past medical history significant for chronic diastolic CHF, diabetes, hypertension, stage IV CK D, myelodysplastic syndrome and PAF  presenting today with abdominal pain. Patient has history of gastroparesis in the past. Has not had any episodes recently. However he states this feels very similar. Patient has not had any vomiting. No  diarrhea. Patient has no tachycardia and has not been dehydrated. This all started today. Patient states it feels like usuall, his nursing home on him to come. Patient appears very comfortable on exam. We will get CT to make sure that there is no acute pathology requiring intervention. We'll also get chest x-ray because patient reported a short period of time in which she was having having shortness of breath. Sating 92% on RA, mild tachypnea.  +WBC, Left lingua suspicious for pna. Inverted T laterallyu Colitis on CT--> c diff sent.  Will admit for HCAP, further characterization of colitis.   Final Clinical Impressions(s) / ED Diagnoses   Final diagnoses:  None    New Prescriptions New Prescriptions   No medications on file     Krist Rosenboom Julio Alm, MD 04/08/16 [id number]

## 2016-04-09 ENCOUNTER — Encounter (HOSPITAL_COMMUNITY): Payer: Self-pay

## 2016-04-09 ENCOUNTER — Inpatient Hospital Stay (HOSPITAL_COMMUNITY): Payer: Medicare Other

## 2016-04-09 DIAGNOSIS — E785 Hyperlipidemia, unspecified: Secondary | ICD-10-CM

## 2016-04-09 DIAGNOSIS — D72829 Elevated white blood cell count, unspecified: Secondary | ICD-10-CM

## 2016-04-09 DIAGNOSIS — E1169 Type 2 diabetes mellitus with other specified complication: Secondary | ICD-10-CM

## 2016-04-09 DIAGNOSIS — R9431 Abnormal electrocardiogram [ECG] [EKG]: Secondary | ICD-10-CM

## 2016-04-09 LAB — GLUCOSE, CAPILLARY
GLUCOSE-CAPILLARY: 100 mg/dL — AB (ref 65–99)
GLUCOSE-CAPILLARY: 115 mg/dL — AB (ref 65–99)
GLUCOSE-CAPILLARY: 89 mg/dL (ref 65–99)
Glucose-Capillary: 129 mg/dL — ABNORMAL HIGH (ref 65–99)
Glucose-Capillary: 108 mg/dL — ABNORMAL HIGH (ref 65–99)
Glucose-Capillary: 136 mg/dL — ABNORMAL HIGH (ref 65–99)

## 2016-04-09 LAB — COMPREHENSIVE METABOLIC PANEL
ALBUMIN: 2.7 g/dL — AB (ref 3.5–5.0)
ALT: 8 U/L — AB (ref 17–63)
AST: 18 U/L (ref 15–41)
Alkaline Phosphatase: 47 U/L (ref 38–126)
Anion gap: 7 (ref 5–15)
BUN: 22 mg/dL — AB (ref 6–20)
CHLORIDE: 112 mmol/L — AB (ref 101–111)
CO2: 23 mmol/L (ref 22–32)
Calcium: 8.5 mg/dL — ABNORMAL LOW (ref 8.9–10.3)
Creatinine, Ser: 1.49 mg/dL — ABNORMAL HIGH (ref 0.61–1.24)
GFR, EST AFRICAN AMERICAN: 50 mL/min — AB (ref >60)
GFR, EST NON AFRICAN AMERICAN: 43 mL/min — AB (ref >60)
Glucose, Bld: 86 mg/dL (ref 65–99)
POTASSIUM: 3 mmol/L — AB (ref 3.5–5.1)
Sodium: 142 mmol/L (ref 135–145)
Total Bilirubin: 0.5 mg/dL (ref 0.3–1.2)
Total Protein: 6.3 g/dL — ABNORMAL LOW (ref 6.5–8.1)

## 2016-04-09 LAB — ECHOCARDIOGRAM COMPLETE
Height: 70 in
Weight: 2807.78 oz

## 2016-04-09 LAB — CBC
HEMATOCRIT: 30.4 % — AB (ref 39.0–52.0)
Hemoglobin: 9.6 g/dL — ABNORMAL LOW (ref 13.0–17.0)
MCH: 30.6 pg (ref 26.0–34.0)
MCHC: 31.6 g/dL (ref 30.0–36.0)
MCV: 96.8 fL (ref 78.0–100.0)
Platelets: 141 10*3/uL — ABNORMAL LOW (ref 150–400)
RBC: 3.14 MIL/uL — AB (ref 4.22–5.81)
RDW: 23.3 % — ABNORMAL HIGH (ref 11.5–15.5)
WBC: 13.6 10*3/uL — AB (ref 4.0–10.5)

## 2016-04-09 LAB — MRSA PCR SCREENING: MRSA BY PCR: POSITIVE — AB

## 2016-04-09 LAB — TROPONIN I
TROPONIN I: 0.08 ng/mL — AB (ref <0.03)
Troponin I: 0.07 ng/mL (ref <0.03)

## 2016-04-09 MED ORDER — POTASSIUM CHLORIDE CRYS ER 20 MEQ PO TBCR
40.0000 meq | EXTENDED_RELEASE_TABLET | Freq: Once | ORAL | Status: AC
  Administered 2016-04-09: 40 meq via ORAL
  Filled 2016-04-09: qty 2

## 2016-04-09 MED ORDER — VANCOMYCIN 50 MG/ML ORAL SOLUTION
125.0000 mg | Freq: Four times a day (QID) | ORAL | Status: DC
  Administered 2016-04-09 – 2016-04-16 (×29): 125 mg via ORAL
  Filled 2016-04-09 (×34): qty 2.5

## 2016-04-09 MED ORDER — FUROSEMIDE 40 MG PO TABS
40.0000 mg | ORAL_TABLET | Freq: Every day | ORAL | Status: DC
  Administered 2016-04-09 – 2016-04-16 (×8): 40 mg via ORAL
  Filled 2016-04-09 (×8): qty 1

## 2016-04-09 NOTE — Progress Notes (Signed)
Patient ID: [name]., male   DOB: 1935-10-09, 80 y.o.   MRN: [id number]  PROGRESS NOTE    Romanus Osada  [id number] DOB: [birth year] DOA: 04/08/2016  PCP: [name], MD   Brief Narrative:  80 year old male with past medical history significant for atrial fibrillation on aspirin and amiodarone, coronary artery disease status post CABG, history of C. difficile colitis, dyslipidemia, hypertension, diabetes mellitus who presented to [facility] long ED with abdominal pain, decreased appetite, diarrhea. Patient also had reports of associated shortness of breath. Symptoms started 2 days prior to the admission.  In ED, patients blood pressure as high as 237/97 which then improved to 158/61. Blood work was notable for leukocytosis of 12.3, hemoglobin 11, creatinine 1.59 (baseline creatinine 1.9 about 2 weeks ago). The troponin level was 0.07. The 12-lead EKG showed inverted T waves in lateral leads. Patient did not have reports of chest pain. Chest x-ray showed aortic atherosclerosis with left basilar atelectasis or infiltrate. CT abdomen showed a diffuse acute colitis favoring infectious colitis and possibly C. difficile colitis. Patient also had notable moderate layering left and small layering right pleural effusions. He was started empirically on Cipro and Flagyl. Stool for C. difficile came back positive for which reason vanco PO added.  Assessment & Plan:   Active Problems: Abdominal pain / acute diffuse C. difficile colitis / Leukocytosis  - Stool for C. difficile positive for C. difficile antigen and toxin - Patient has had previous history of C. 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 (HCC) - Currently on sliding scale insulin - CBG's since admission: 115, 89, 100    Essential hypertension - Continue coreg, imdur, hydralazine, lasix     Dyslipidemia associated with that 2 diabetes mellitus - Continue Lipitor 80 mg at bedtime    Hypokalemia - Due to Lasix - Supplemented    Mild troponin elevation - Troponin level 0.07, 0.08 - TRH spoke with cardiology who recommended 2-D echo - Patient has no reports of chest pain    Chronic kidney disease stage III - Baseline creatinine 1.9 about 2 weeks ago - Creatinine on this admission within baseline values    Anemia of chronic disease - Secondary to CKD - Hemoglobin stable        DVT prophylaxis: Heparin subcutaneous Code Status: full code  Family Communication: Family not at the bedside Disposition Plan: Home once diarrhea improves   Consultants:   None   Procedures:   None   Antimicrobials:   Cipro 04/08/2016 --> 04/09/2016  Flagyl 04/08/2016 -->  Vancomycin by mouth 04/08/2016-->     Subjective: No overnight events  Objective: Vitals:   04/08/16 2105 04/08/16 2126 04/08/16 2344 04/09/16 0542  BP: (!) 237/96 (!) 226/98 (!) 181/81 (!) 158/61  Pulse: (!) 103   79  Resp: 20   18  Temp: 98.5 F (36.9 C)   99.6 F (37.6 C)  TempSrc: Oral   Oral  SpO2: 100%   100%  Weight:    79.6 kg (175 lb 7.8 oz)  Height:    5\' 10"  (1.778 m)    Intake/Output Summary (Last 24 hours) at 04/09/16 0847 Last data filed at 04/09/16 0827  Gross per 24 hour  Intake              650 ml  Output                0 ml  Net              650 ml   Filed Weights   04/09/16 0542  Weight: 79.6 kg (175 lb 7.8 oz)    Examination:  General exam: Appears calm and comfortable  Respiratory  system: Clear to auscultation. Respiratory effort normal. Cardiovascular system: S1 & S2 heard, Rate controlled  Gastrointestinal system: Abdomen is nondistended, soft and nontender. No organomegaly or masses felt. Normal bowel sounds heard. Central nervous system: Alert and oriented. No focal neurological deficits. Extremities: Symmetric 5 x 5 power. Skin: No rashes, lesions or ulcers Psychiatry: Judgement and insight appear normal. Mood & affect appropriate.   Data Reviewed: I have personally reviewed following labs and imaging studies  CBC:  Recent Labs Lab 04/08/16 1503 04/09/16 0530  WBC 12.3* 13.6*  HGB 11.0* 9.6*  HCT 35.3* 30.4*  MCV 97.0 96.8  PLT 182 Q000111Q*   Basic Metabolic Panel:  Recent Labs Lab 04/07/16 04/08/16 1503 04/09/16 0530  NA 145 140 142  K 3.6 3.8 3.0*  CL  --  109 112*  CO2  --  24 23  GLUCOSE  --  152* 86  BUN 21 22* 22*  CREATININE 1.4* 1.59* 1.49*  CALCIUM  --  8.8* 8.5*   GFR: Estimated Creatinine Clearance: 42.2 mL/min (by C-G formula based on SCr of 1.49 mg/dL (H)). Liver Function Tests:  Recent Labs Lab 04/08/16 1503 04/09/16 0530  AST 24 18  ALT 10* 8*  ALKPHOS 58 47  BILITOT 0.6 0.5  PROT 7.9 6.3*  ALBUMIN 3.4* 2.7*    Recent Labs Lab 04/08/16 1503  LIPASE 31   No results for input(s): AMMONIA in the last 168 hours. Coagulation Profile:  Recent Labs Lab 04/08/16 1503  INR 1.00   Cardiac Enzymes:  Recent Labs Lab 04/08/16 2303 04/09/16 0530  TROPONINI 0.07* 0.08*   BNP (last 3 results) No results for input(s): PROBNP in the last 8760 hours. HbA1C: No results for input(s): HGBA1C in the last 72 hours. CBG:  Recent Labs Lab 04/08/16 2052 04/09/16 0005 04/09/16 0540 04/09/16 0757  GLUCAP 133* 115* 89 100*   Lipid Profile: No results for input(s): CHOL, HDL, LDLCALC, TRIG, CHOLHDL, LDLDIRECT in the last 72 hours. Thyroid Function Tests: No results for input(s): TSH, T4TOTAL, FREET4, T3FREE, THYROIDAB  in the last 72 hours. Anemia Panel: No results for input(s): VITAMINB12, FOLATE, FERRITIN, TIBC, IRON, RETICCTPCT in the last 72 hours. Urine analysis:    Component Value Date/Time   COLORURINE YELLOW 04/08/2016 1449   APPEARANCEUR CLOUDY (A) 04/08/2016 1449   LABSPEC 1.016 04/08/2016 1449   LABSPEC 1.010 05/19/2015 1231   PHURINE 5.0 04/08/2016 1449   GLUCOSEU NEGATIVE 04/08/2016 1449   GLUCOSEU Negative 05/19/2015 1231   HGBUR SMALL (A) 04/08/2016 1449   BILIRUBINUR NEGATIVE 04/08/2016 1449   BILIRUBINUR Negative 05/19/2015 1231   KETONESUR NEGATIVE 04/08/2016 1449   PROTEINUR 30 (A) 04/08/2016 1449   UROBILINOGEN 0.2 05/19/2015 1231   NITRITE NEGATIVE 04/08/2016 1449   LEUKOCYTESUR LARGE (A) 04/08/2016 1449  LEUKOCYTESUR Negative 05/19/2015 1231   Sepsis Labs: @LABRCNTIP (procalcitonin:4,lacticidven:4)    MRSA PCR Screening     Status: Abnormal   Collection Time: 04/08/16 12:10 AM  Result Value Ref Range Status   MRSA by PCR POSITIVE (A) NEGATIVE Final  C difficile quick scan w PCR reflex     Status: Abnormal   Collection Time: 04/08/16  4:40 PM  Result Value Ref Range Status   C Diff antigen POSITIVE (A) NEGATIVE Final   C Diff toxin POSITIVE (A) NEGATIVE Final   C Diff interpretation Toxin producing C. difficile detected.  Final      Radiology Studies: Dg Chest 2 View Result Date: 04/08/2016 Aortic atherosclerosis. Mild left basilar atelectasis or infiltrate is noted with mild left pleural effusion.   Ct Abdomen Pelvis W Contrast Result Date: 9/28/20171. Diffuse acute colitis. Favor infectious colitis and consider C. difficile colitis. 2. Superimposed anasarca. Moderate layering left and small layering right pleural effusions. Associated lung base atelectasis but difficult to exclude developing pneumonia in the lingula and at the right lung base. 3. Chronic severe diffuse calcified atherosclerosis throughout the abdomen and pelvis. No definite large vessel  occlusion. 4. Small fluid containing right femoral hernia suspected and new since February.    Scheduled Meds: . amiodarone  100 mg Oral Daily  . aspirin EC  81 mg Oral Daily  . atorvastatin  80 mg Oral QHS  . Calcifediol ER  30 mcg Oral QHS  . carvedilol  12.5 mg Oral BID WC  . ciprofloxacin  400 mg Intravenous Q12H  . ferrous sulfate  325 mg Oral TID WC  . heparin  5,000 Units Subcutaneous Q8H  . hydrALAZINE  75 mg Oral QID  . insulin aspart  0-15 Units Subcutaneous Q4H  . isosorbide mononitrate  120 mg Oral Daily  . metronidazole  500 mg Intravenous Q8H  . pantoprazole  40 mg Oral Daily  . potassium chloride  40 mEq Oral Once  . sodium chloride flush  3 mL Intravenous Q12H  . tamsulosin  0.4 mg Oral QHS  . vancomycin  125 mg Oral QID   Continuous Infusions:    LOS: 1 day    Time spent: 25 minutes  Greater than 50% of the time spent on counseling and coordinating the care.   Leisa Lenz, MD [facility] Pager [phone]  If 7PM-7AM, please contact night-coverage [email or web address] Password [email or web address] 04/09/2016, 8:47 AM

## 2016-04-09 NOTE — Progress Notes (Addendum)
Pharmacy Antibiotic Note  [name] Sima. is a 80 y.o. male admitted on 04/08/2016 with C.diff.  Pharmacy has been consulted for C.diff treatment.  Patient reported having Cdiff in the past per MD but not recently.  Plan: Vancomycin 125 mg PO QID x 14 days. Discontinuing Cipro, Protonix per discussion with MD.  MD would like to continue IV Flagyl. Pharmacy will sign off at this time, please re-consult if needed.  Height: 5\' 10"  (177.8 cm) Weight: 175 lb 7.8 oz (79.6 kg) IBW/kg (Calculated) : 73  Temp (24hrs), Avg:98.4 F (36.9 C), Min:97.1 F (36.2 C), Max:99.6 F (37.6 C)   Recent Labs Lab 04/07/16 04/08/16 1503 04/09/16 0530  WBC  --  12.3* 13.6*  CREATININE 1.4* 1.59* 1.49*    Estimated Creatinine Clearance: 42.2 mL/min (by C-G formula based on SCr of 1.49 mg/dL (H)).    Allergies  Allergen Reactions  . Nsaids Nausea Only    GI issue  . Ibuprofen Other (See Comments)    GI Issues  . Ace Inhibitors Cough    Antimicrobials this admission: 9/28 Vancomycin >> 9/28 9/28 Zosyn >>9/28 9/28 Flagyl/Cipro >> 9/29 PO Vanc >>   Dose adjustments this admission: ---   Microbiology results: 9/28 UCx: sent 9/28 C.diff: toxin+, antigen+ 9/28 MRSA screen +  Thank you for allowing pharmacy to be a part of this patient's care.  Hershal Coria 04/09/2016 8:18 AM

## 2016-04-09 NOTE — Progress Notes (Signed)
*  PRELIMINARY RESULTS* Echocardiogram 2D Echocardiogram has been performed.  [name] 04/09/2016, 3:42 PM

## 2016-04-10 DIAGNOSIS — A0472 Enterocolitis due to Clostridium difficile, not specified as recurrent: Secondary | ICD-10-CM | POA: Diagnosis present

## 2016-04-10 LAB — BASIC METABOLIC PANEL
ANION GAP: 4 — AB (ref 5–15)
BUN: 25 mg/dL — ABNORMAL HIGH (ref 6–20)
CHLORIDE: 111 mmol/L (ref 101–111)
CO2: 23 mmol/L (ref 22–32)
CREATININE: 2.1 mg/dL — AB (ref 0.61–1.24)
Calcium: 8.1 mg/dL — ABNORMAL LOW (ref 8.9–10.3)
GFR calc non Af Amer: 29 mL/min — ABNORMAL LOW (ref >60)
GFR, EST AFRICAN AMERICAN: 33 mL/min — AB (ref >60)
Glucose, Bld: 80 mg/dL (ref 65–99)
POTASSIUM: 3.5 mmol/L (ref 3.5–5.1)
SODIUM: 138 mmol/L (ref 135–145)

## 2016-04-10 LAB — GLUCOSE, CAPILLARY
GLUCOSE-CAPILLARY: 88 mg/dL (ref 65–99)
Glucose-Capillary: 135 mg/dL — ABNORMAL HIGH (ref 65–99)
Glucose-Capillary: 145 mg/dL — ABNORMAL HIGH (ref 65–99)
Glucose-Capillary: 163 mg/dL — ABNORMAL HIGH (ref 65–99)
Glucose-Capillary: 89 mg/dL (ref 65–99)
Glucose-Capillary: 94 mg/dL (ref 65–99)

## 2016-04-10 LAB — CBC
HCT: 26.6 % — ABNORMAL LOW (ref 39.0–52.0)
Hemoglobin: 8.2 g/dL — ABNORMAL LOW (ref 13.0–17.0)
MCH: 29.1 pg (ref 26.0–34.0)
MCHC: 30.8 g/dL (ref 30.0–36.0)
MCV: 94.3 fL (ref 78.0–100.0)
Platelets: 129 K/uL — ABNORMAL LOW (ref 150–400)
RBC: 2.82 MIL/uL — ABNORMAL LOW (ref 4.22–5.81)
RDW: 22.2 % — ABNORMAL HIGH (ref 11.5–15.5)
WBC: 7.2 K/uL (ref 4.0–10.5)

## 2016-04-10 LAB — URINE CULTURE

## 2016-04-10 MED ORDER — VANCOMYCIN 50 MG/ML ORAL SOLUTION: 125.0000 mg | Freq: Four times a day (QID) | ORAL | 0 refills | Status: DC

## 2016-04-10 MED ORDER — METRONIDAZOLE 500 MG PO TABS: 500.0000 mg | ORAL_TABLET | Freq: Three times a day (TID) | ORAL | 0 refills | Status: DC

## 2016-04-10 NOTE — Progress Notes (Addendum)
Patient ID: [name] Brow., male   DOB: 04-13-1936, 80 y.o.   MRN: [id number]  PROGRESS NOTE    [name]  [id number] DOB: [birth year] DOA: 04/08/2016  PCP: [name], MD   Brief Narrative:  80 year old male with past medical history significant for atrial fibrillation on aspirin and amiodarone, coronary artery disease status post CABG, history of C. difficile colitis, dyslipidemia, hypertension, diabetes mellitus who presented to [facility] long ED with abdominal pain, decreased appetite, diarrhea. Patient also had reports of associated shortness of breath. Symptoms started 2 days prior to the admission.  In ED, patients blood pressure as high as 237/97 which then improved to 158/61. Blood work was notable for leukocytosis of 12.3, hemoglobin 11, creatinine 1.59 (baseline creatinine 1.9 about 2 weeks ago). The troponin level was 0.07. The 12-lead EKG showed inverted T waves in lateral leads. Patient did not have reports of chest pain. Chest x-ray showed aortic atherosclerosis with left basilar atelectasis or infiltrate. CT abdomen showed a diffuse acute colitis favoring infectious colitis and possibly C. difficile colitis. Patient also had notable moderate layering left and small layering right pleural effusions. He was started empirically on Cipro and Flagyl. Stool for C. difficile came back positive for which reason vanco PO added.  Assessment & Plan:   Active Problems: Abdominal pain / acute diffuse C. difficile colitis / Leukocytosis  - Stool for C. difficile positive for C. difficile antigen and toxin - Patient has had previous history of C. 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 (HCC) - Currently on sliding scale insulin    Essential hypertension - Continue coreg, imdur, hydralazine, lasix     Dyslipidemia associated with that 2 diabetes mellitus - Continue Lipitor 80 mg at bedtime    Hypokalemia - Due to Lasix - Supplemented    Mild troponin elevation / Possible vegetation on TV - Troponin level 0.07, 0.08 - Patient has no reports of chest pain - 2 D ECHO with possible vegetation on TV, appreciate cardio scheduling TEE on Monday     Chronic kidney disease stage III - Baseline creatinine 1.9 about 2 weeks ago - Creatinine on this admission within baseline values    Anemia of chronic disease - Secondary to CKD - Hemoglobin stable        DVT prophylaxis: Heparin subcutaneous Code Status: full code  Family Communication: Family not at the bedside Disposition Plan: Plan for TEE monday   Consultants:   Cardio for sch TEE  Procedures:   2 D ECHO -  EF 55%, ? Vegetation on TV  Antimicrobials:   Cipro 04/08/2016 --> 04/09/2016  Flagyl 04/08/2016 -->  Vancomycin by mouth 04/08/2016-->     Subjective: No overnight events  Objective: Vitals:   04/09/16 2031 04/10/16 0410 04/10/16 1317 04/10/16 1414  BP: 139/62 (!) 143/71 (!) 131/51 (!) 132/57  Pulse: 69 65 (!) 53 71  Resp:  20  18  Temp: 98.6 F (37 C) 98.5 F (36.9 C)  98.3 F (36.8 C)  TempSrc: Oral Oral  Oral  SpO2: 100% 100%  100%  Weight:  Height:        Intake/Output Summary (Last 24 hours) at 04/10/16 1433 Last data filed at 04/10/16 1300  Gross per 24 hour  Intake              610 ml  Output                0 ml  Net              610 ml   Filed Weights   04/09/16 0542  Weight: 79.6 kg (175 lb 7.8 oz)    Examination:  General exam: Appears calm and comfortable, no distress  Respiratory system: Clear to auscultation. Respiratory effort  normal. Cardiovascular system: S1 & S2 heard, RRR Gastrointestinal system: (+) BS, non tender abd Central nervous system: No focal neurological deficits. Extremities: Symmetric 5 x 5 power. No tenderness  Skin: warm and dry  Psychiatry: Normal mood and behavior    Data Reviewed: I have personally reviewed following labs and imaging studies  CBC:  Recent Labs Lab 04/08/16 1503 04/09/16 0530 04/10/16 0527  WBC 12.3* 13.6* 7.2  HGB 11.0* 9.6* 8.2*  HCT 35.3* 30.4* 26.6*  MCV 97.0 96.8 94.3  PLT 182 141* Q000111Q*   Basic Metabolic Panel:  Recent Labs Lab 04/07/16 04/08/16 1503 04/09/16 0530 04/10/16 0527  NA 145 140 142 138  K 3.6 3.8 3.0* 3.5  CL  --  109 112* 111  CO2  --  24 23 23   GLUCOSE  --  152* 86 80  BUN 21 22* 22* 25*  CREATININE 1.4* 1.59* 1.49* 2.10*  CALCIUM  --  8.8* 8.5* 8.1*   GFR: Estimated Creatinine Clearance: 29.9 mL/min (by C-G formula based on SCr of 2.1 mg/dL (H)). Liver Function Tests:  Recent Labs Lab 04/08/16 1503 04/09/16 0530  AST 24 18  ALT 10* 8*  ALKPHOS 58 47  BILITOT 0.6 0.5  PROT 7.9 6.3*  ALBUMIN 3.4* 2.7*    Recent Labs Lab 04/08/16 1503  LIPASE 31   No results for input(s): AMMONIA in the last 168 hours. Coagulation Profile:  Recent Labs Lab 04/08/16 1503  INR 1.00   Cardiac Enzymes:  Recent Labs Lab 04/08/16 2303 04/09/16 0530 04/09/16 1110  TROPONINI 0.07* 0.08* 0.07*   BNP (last 3 results) No results for input(s): PROBNP in the last 8760 hours. HbA1C: No results for input(s): HGBA1C in the last 72 hours. CBG:  Recent Labs Lab 04/09/16 2028 04/10/16 0049 04/10/16 0416 04/10/16 0730 04/10/16 1249  GLUCAP 136* 94 89 88 145*   Lipid Profile: No results for input(s): CHOL, HDL, LDLCALC, TRIG, CHOLHDL, LDLDIRECT in the last 72 hours. Thyroid Function Tests: No results for input(s): TSH, T4TOTAL, FREET4, T3FREE, THYROIDAB in the last 72 hours. Anemia Panel: No results for input(s): VITAMINB12,  FOLATE, FERRITIN, TIBC, IRON, RETICCTPCT in the last 72 hours. Urine analysis:    Component Value Date/Time   COLORURINE YELLOW 04/08/2016 1449   APPEARANCEUR CLOUDY (A) 04/08/2016 1449   LABSPEC 1.016 04/08/2016 1449   LABSPEC 1.010 05/19/2015 1231   PHURINE 5.0 04/08/2016 1449   GLUCOSEU NEGATIVE 04/08/2016 1449   GLUCOSEU Negative 05/19/2015 1231   HGBUR SMALL (A) 04/08/2016 1449   BILIRUBINUR NEGATIVE 04/08/2016 1449   BILIRUBINUR Negative 05/19/2015 1231   KETONESUR NEGATIVE 04/08/2016 1449   PROTEINUR 30 (A) 04/08/2016 1449   UROBILINOGEN 0.2 05/19/2015 1231   NITRITE NEGATIVE 04/08/2016 1449   LEUKOCYTESUR LARGE (A) 04/08/2016 1449   LEUKOCYTESUR Negative 05/19/2015 1231  Sepsis Labs: @LABRCNTIP (procalcitonin:4,lacticidven:4)    MRSA PCR Screening     Status: Abnormal   Collection Time: 04/08/16 12:10 AM  Result Value Ref Range Status   MRSA by PCR POSITIVE (A) NEGATIVE Final  C difficile quick scan w PCR reflex     Status: Abnormal   Collection Time: 04/08/16  4:40 PM  Result Value Ref Range Status   C Diff antigen POSITIVE (A) NEGATIVE Final   C Diff toxin POSITIVE (A) NEGATIVE Final   C Diff interpretation Toxin producing C. difficile detected.  Final      Radiology Studies: Dg Chest 2 View Result Date: 04/08/2016 Aortic atherosclerosis. Mild left basilar atelectasis or infiltrate is noted with mild left pleural effusion.   Ct Abdomen Pelvis W Contrast Result Date: 9/28/20171. Diffuse acute colitis. Favor infectious colitis and consider C. difficile colitis. 2. Superimposed anasarca. Moderate layering left and small layering right pleural effusions. Associated lung base atelectasis but difficult to exclude developing pneumonia in the lingula and at the right lung base. 3. Chronic severe diffuse calcified atherosclerosis throughout the abdomen and pelvis. No definite large vessel occlusion. 4. Small fluid containing right femoral hernia suspected and new since  February.    Scheduled Meds: . amiodarone  100 mg Oral Daily  . aspirin EC  81 mg Oral Daily  . atorvastatin  80 mg Oral QHS  . Calcifediol ER  30 mcg Oral QHS  . carvedilol  12.5 mg Oral BID WC  . ferrous sulfate  325 mg Oral TID WC  . furosemide  40 mg Oral Daily  . heparin  5,000 Units Subcutaneous Q8H  . hydrALAZINE  75 mg Oral QID  . insulin aspart  0-15 Units Subcutaneous Q4H  . isosorbide mononitrate  120 mg Oral Daily  . metronidazole  500 mg Intravenous Q8H  . sodium chloride flush  3 mL Intravenous Q12H  . tamsulosin  0.4 mg Oral QHS  . vancomycin  125 mg Oral QID   Continuous Infusions:    LOS: 2 days    Time spent: 25 minutes  Greater than 50% of the time spent on counseling and coordinating the care.   Leisa Lenz, MD [facility] Pager [phone]  If 7PM-7AM, please contact night-coverage [email or web address] Password [facility] 04/10/2016, 2:33 PM

## 2016-04-10 NOTE — Progress Notes (Signed)
    [facility] has been requested to perform a transesophageal echocardiogram on [name] D Vogelgesang Sr. for possible vegetation beneath septal leaflet off TV in RVOT.  After careful review of history and examination, the risks and benefits of transesophageal echocardiogram have been explained including risks of esophageal damage, perforation (1:10,000 risk), bleeding, pharyngeal hematoma as well as other potential complications associated with conscious sedation including aspiration, arrhythmia, respiratory failure and death. Alternatives to treatment were discussed, questions were answered. Patient is willing to proceed.   Angelena Form, PA-C  04/10/2016 3:00 PM

## 2016-04-10 NOTE — Discharge Instructions (Addendum)
Colitis Colitis is inflammation of the colon. Colitis may last a short time (acute) or it may last a long time (chronic). CAUSES This condition may be caused by:  Viruses.  Bacteria.  Reactions to medicine.  Certain autoimmune diseases, such as Crohn disease or ulcerative colitis. SYMPTOMS Symptoms of this condition include:  Diarrhea.  Passing bloody or tarry stool.  Pain.  Fever.  Vomiting.  Tiredness (fatigue).  Weight loss.  Bloating.  Sudden increase in abdominal pain.  Having fewer bowel movements than usual. DIAGNOSIS This condition is diagnosed with a stool test or a blood test. You may also have other tests, including X-rays, a CT scan, or a colonoscopy. TREATMENT Treatment may include:  Resting the bowel. This involves not eating or drinking for a period of time.  Fluids that are given through an IV tube.  Medicine for pain and diarrhea.  Antibiotic medicines.  Cortisone medicines.  Surgery. HOME CARE INSTRUCTIONS Eating and Drinking  Follow instructions from your health care provider about eating or drinking restrictions.  Drink enough fluid to keep your urine clear or pale yellow.  Work with a dietitian to determine which foods cause your condition to flare up.  Avoid foods that cause flare-ups.  Eat a well-balanced diet. Medicines  Take over-the-counter and prescription medicines only as told by your health care provider.  If you were prescribed an antibiotic medicine, take it as told by your health care provider. Do not stop taking the antibiotic even if you start to feel better. General Instructions  Keep all follow-up visits as told by your health care provider. This is important. SEEK MEDICAL CARE IF:  Your symptoms do not go away.  You develop new symptoms. SEEK IMMEDIATE MEDICAL CARE IF:  You have a fever that does not go away with treatment.  You develop chills.  You have extreme weakness, fainting, or  dehydration.  You have repeated vomiting.  You develop severe pain in your abdomen.  You pass bloody or tarry stool.   This information is not intended to replace advice given to you by your health care provider. Make sure you discuss any questions you have with your health care provider.   Document Released: 08/05/2004 Document Revised: 03/19/2015 Document Reviewed: 10/21/2014 Elsevier Interactive Patient Education 2016 [employer] Inc[employer]  Clostridium Difficile Infection Clostridium difficile (C. difficile or C. diff) is a bacterium normally found in the intestinal tract or colon. C. difficile infection causes diarrhea and sometimes a severe disease called pseudomembranous colitis (C. difficile colitis). C. difficile colitis can damage the lining of the colon or cause the colon to become very large (toxic megacolon). Older adults and people with certain medical conditions have a greater risk of getting C. difficile infections. CAUSES The balance of bacteria in your colon can change when you are sick, especially when taking antibiotic medicine. Taking antibiotics may allow the C. difficile to grow, multiply, and make a toxin that causes C. difficile infection.  SYMPTOMS  Diarrhea.  Fever.  Fatigue.  Loss of appetite.  Nausea.  Abdominal swelling, pain, or tenderness.  Dehydration. DIAGNOSIS Your health care provider may suspect C. difficile infection based on your symptoms and if you have taken antibiotics recently. Your health care provider may also order:  A lab test that can detect the toxin in your stool.  A sigmoidoscopy or colonoscopy to look at the appearance of your colon. These procedures involve passing an instrument through your rectum to look at the inside of your colon. Your health care  provider will help determine if these tests are necessary. TREATMENT Treatment may include:  Taking antibiotics that keep C. difficile from growing.  Stopping the antibiotics you  were on before the C. difficile infection began. Only do this if instructed to do so by your health care provider.  IV fluids and correction of electrolyte imbalance.  Surgery to remove the infected part of the intestines. This is rare. HOME CARE INSTRUCTIONS  Drink enough fluids to keep your urine clear or pale yellow. Avoid milk, caffeine, and alcohol.  Ask your health care provider for specific rehydration instructions.  Eat small, frequent meals rather than large meals.  Take your antibiotics as directed. Finish them even if you start to feel better.  Do not use medicines to slow diarrhea. This could delay healing or cause problems.  Wash your hands thoroughly after using the bathroom and before preparing food. Make sure people who live with you wash their hands often, too.  Clean all surfaces with a product that contains chlorine bleach. SEEK MEDICAL CARE IF:  Your diarrhea lasts longer than expected or comes back after you finish your antibiotic medicine for the C. difficile infection.  You have trouble staying hydrated.  You have a fever. SEEK IMMEDIATE MEDICAL CARE IF:  You have increasing abdominal pain or tenderness.  You have blood in your stools, or your stools look dark black and tarry.  You cannot eat or drink without vomiting.   This information is not intended to replace advice given to you by your health care provider. Make sure you discuss any questions you have with your health care provider.   Document Released: 04/07/2005 Document Revised: 07/19/2014 Document Reviewed: 12/30/2014 Elsevier Interactive Patient Education 2016 [employer].    Endocarditis Endocarditis is an infection of the inner layer of the heart (endocardium) or of the heart valves. Endocarditis can cause growths inside the heart or on the heart valves. These growths can destroy heart tissue and cause heart failure over time. They can also cause stroke if they break away and form a  blood clot in the brain. CAUSES  Endocarditis is caused by germs that normally live in or on your body. The germs that most commonly cause endocarditis are bacteria, but fungi can also cause endocarditis. RISK FACTORS Risk factors include:  Having a heart defect.  Having artificial (prosthetic) heart valves.  Having an abnormal or damaged heart valve.  Having a history of endocarditis.  Having had a heart transplant. SIGNS AND SYMPTOMS Signs and symptoms may start suddenly, or they may start slowly and gradually get worse. Symptoms include:  Fever.  Chills.  Night sweats.  Muscle aches.  Fatigue.  Weakness.  Shortness of breath. Signs include:  An abnormal heart sound (murmur).  Retinal bleeding.  Bleeding under the nails of your fingers or toes.  Painless red spots on your palms.  Painful lumps in your fingertips or toes.  Swelling in your feet or ankles. DIAGNOSIS  To make a diagnosis, your health care provider may:  Perform a physical exam. During the exam he or she will listen to your heart to check for a murmur. He or she may also use a scope to check for bleeding in your retinas.  Order tests. They may include:  Blood tests to look for the germs that cause endocarditis.  An echocardiogram to create an image of your heart. TREATMENT Early treatment offers the best chance for curing endocarditis and preventing complications. Treatment depends on the cause of the  endocarditis. Treatment may include:  Antibiotic medicines. These may be given through an IV tube or taken orally.  Surgery to replace your heart valve. You may need surgery if:  The endocarditis does not respond to treatment.  You develop complications.  Your heart valve is severely damaged. HOME CARE INSTRUCTIONS  Take your antibiotic as directed by your health care provider. Finish the antibiotic even if you start to feel better.  Gradually resume your usual activities.  Let your  health care provider know before you have any dental or surgical procedures. You may need to take antibiotics before the procedure.  Let all your health care providers, including your dentist, know that you have had endocarditis.  Do not get tattoos or body piercings.  Do not use IV drugs unless it is part of your medical treatment.  Practice good oral hygiene. This includes:  Brushing and flossing regularly.  Scheduling routine dental appointments. SEEK MEDICAL CARE IF:  You have a fever.  Your symptoms do not improve.  Your symptoms get worse.  Your symptoms come back. SEEK IMMEDIATE MEDICAL CARE IF:  You have trouble breathing.  You have chest pain.  You have symptoms of stroke. These include:  Sudden weakness.  Numbness.  Confusion.  Trouble talking.  A severe headache.   This information is not intended to replace advice given to you by your health care provider. Make sure you discuss any questions you have with your health care provider.   Document Released: 06/28/2005 Document Revised: 07/19/2014 Document Reviewed: 02/12/2014 Elsevier Interactive Patient Education [employer].

## 2016-04-11 LAB — CBC
HCT: 27.8 % — ABNORMAL LOW (ref 39.0–52.0)
Hemoglobin: 8.8 g/dL — ABNORMAL LOW (ref 13.0–17.0)
MCH: 30.4 pg (ref 26.0–34.0)
MCHC: 31.7 g/dL (ref 30.0–36.0)
MCV: 96.2 fL (ref 78.0–100.0)
Platelets: 132 10*3/uL — ABNORMAL LOW (ref 150–400)
RBC: 2.89 MIL/uL — ABNORMAL LOW (ref 4.22–5.81)
RDW: 22 % — AB (ref 11.5–15.5)
WBC: 6.5 10*3/uL (ref 4.0–10.5)

## 2016-04-11 LAB — GLUCOSE, CAPILLARY
GLUCOSE-CAPILLARY: 117 mg/dL — AB (ref 65–99)
GLUCOSE-CAPILLARY: 134 mg/dL — AB (ref 65–99)
GLUCOSE-CAPILLARY: 76 mg/dL (ref 65–99)
GLUCOSE-CAPILLARY: 97 mg/dL (ref 65–99)
Glucose-Capillary: 101 mg/dL — ABNORMAL HIGH (ref 65–99)

## 2016-04-11 MED ORDER — CALCIUM CARBONATE-VITAMIN D 500-200 MG-UNIT PO TABS
1.0000 | ORAL_TABLET | Freq: Every day | ORAL | Status: DC
  Administered 2016-04-11 – 2016-04-16 (×6): 1 via ORAL
  Filled 2016-04-11 (×6): qty 1

## 2016-04-11 NOTE — Progress Notes (Signed)
Patient ID: [name] Brow., male   DOB: 1936-03-25, 80 y.o.   MRN: [id number]  PROGRESS NOTE    [name]  [id number] DOB: 09/06/1935 DOA: 04/08/2016  PCP: [name], MD   Brief Narrative:  80 year old male with past medical history significant for atrial fibrillation on aspirin and amiodarone, coronary artery disease status post CABG, history of C. difficile colitis, dyslipidemia, hypertension, diabetes mellitus who presented to [facility] long ED with abdominal pain, decreased appetite, diarrhea. Patient also had reports of associated shortness of breath. Symptoms started 2 days prior to the admission.  In ED, patients blood pressure as high as 237/97 which then improved to 158/61. Blood work was notable for leukocytosis of 12.3, hemoglobin 11, creatinine 1.59 (baseline creatinine 1.9 about 2 weeks ago). The troponin level was 0.07. The 12-lead EKG showed inverted T waves in lateral leads. Patient did not have reports of chest pain. Chest x-ray showed aortic atherosclerosis with left basilar atelectasis or infiltrate. CT abdomen showed a diffuse acute colitis favoring infectious colitis and possibly C. difficile colitis. Patient also had notable moderate layering left and small layering right pleural effusions. He was started empirically on Cipro and Flagyl. Stool for C. difficile came back positive for which reason vanco PO added.  Assessment & Plan:   Active Problems: Abdominal pain / acute diffuse C. difficile colitis / Leukocytosis  - Stool for C. difficile positive for C. difficile antigen and toxin - Patient has had previous history of C. 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 (HCC) - Currently on sliding scale insulin - CBG's in past 24 hours: 76, 101, 117    Essential hypertension - Continue coreg, imdur, hydralazine, lasix     Dyslipidemia associated with that 2 diabetes mellitus - Continue Lipitor 80 mg at bedtime    Hypokalemia - Due to Lasix - Supplemented    Mild troponin elevation / Possible vegetation on TV - Troponin level 0.07, 0.08 - Patient has no reports of chest pain - 2 D ECHO with possible vegetation on TV - Appreciate cardio scheduling TEE on Monday     Chronic kidney disease stage III - Baseline creatinine 1.9 about 2 weeks ago - Creatinine on this admission within baseline values    Anemia of chronic disease - Secondary to CKD - Hemoglobin stable        DVT prophylaxis: Heparin subcutaneous Code Status: full code  Family Communication: Family not at the bedside Disposition Plan: Plan for TEE monday   Consultants:   Cardio for sch TEE  Procedures:   2 D ECHO -  EF 55%, ? Vegetation on TV  Antimicrobials:   Cipro 04/08/2016 --> 04/09/2016  Flagyl 04/08/2016 -->  Vancomycin by mouth 04/08/2016-->     Subjective: No overnight events  Objective: Vitals:   04/10/16 1317 04/10/16 1414 04/10/16 2032 04/11/16 0423  BP: (!) 131/51 (!) 132/57 134/76 (!) 153/76  Pulse: (!) 53 71 75 65  Resp:  18 18 18   Temp:  98.3 F (36.8 C) 98.7 F (37.1 C) 98.5 F (36.9 C)  TempSrc:  Oral Oral Oral  SpO2:  100%  100% 100%  Weight:      Height:        Intake/Output Summary (Last 24 hours) at 04/11/16 1356 Last data filed at 04/11/16 0800  Gross per 24 hour  Intake              660 ml  Output              250 ml  Net              410 ml   Filed Weights   04/09/16 0542  Weight: 79.6 kg (175 lb 7.8 oz)    Examination:  General exam: No distress Respiratory system: No wheezing, no rhonchi . Cardiovascular system: S1 & S2 heard, Rate  controlled  Gastrointestinal system: (+) BS, non tender Central nervous system: Nonfocal  Extremities: Symmetric 5 x 5 power. Pulses palpable  Skin: warm and dry  Psychiatry: Normal mood, not restless    Data Reviewed: I have personally reviewed following labs and imaging studies  CBC:  Recent Labs Lab 04/08/16 1503 04/09/16 0530 04/10/16 0527 04/11/16 0528  WBC 12.3* 13.6* 7.2 6.5  HGB 11.0* 9.6* 8.2* 8.8*  HCT 35.3* 30.4* 26.6* 27.8*  MCV 97.0 96.8 94.3 96.2  PLT 182 141* 129* [id number]*   Basic Metabolic Panel:  Recent Labs Lab 04/07/16 04/08/16 1503 04/09/16 0530 04/10/16 0527  NA 145 140 142 138  K 3.6 3.8 3.0* 3.5  CL  --  109 112* 111  CO2  --  24 23 23   GLUCOSE  --  152* 86 80  BUN 21 22* 22* 25*  CREATININE 1.4* 1.59* 1.49* 2.10*  CALCIUM  --  8.8* 8.5* 8.1*   GFR: Estimated Creatinine Clearance: 29.9 mL/min (by C-G formula based on SCr of 2.1 mg/dL (H)). Liver Function Tests:  Recent Labs Lab 04/08/16 1503 04/09/16 0530  AST 24 18  ALT 10* 8*  ALKPHOS 58 47  BILITOT 0.6 0.5  PROT 7.9 6.3*  ALBUMIN 3.4* 2.7*    Recent Labs Lab 04/08/16 1503  LIPASE 31   No results for input(s): AMMONIA in the last 168 hours. Coagulation Profile:  Recent Labs Lab 04/08/16 1503  INR 1.00   Cardiac Enzymes:  Recent Labs Lab 04/08/16 2303 04/09/16 0530 04/09/16 1110  TROPONINI 0.07* 0.08* 0.07*   BNP (last 3 results) No results for input(s): PROBNP in the last 8760 hours. HbA1C: No results for input(s): HGBA1C in the last 72 hours. CBG:  Recent Labs Lab 04/10/16 2007 04/11/16 0036 04/11/16 0418 04/11/16 0757 04/11/16 1130  GLUCAP 135* 97 76 101* 117*   Lipid Profile: No results for input(s): CHOL, HDL, LDLCALC, TRIG, CHOLHDL, LDLDIRECT in the last 72 hours. Thyroid Function Tests: No results for input(s): TSH, T4TOTAL, FREET4, T3FREE, THYROIDAB in the last 72 hours. Anemia Panel: No results for input(s): VITAMINB12, FOLATE, FERRITIN,  TIBC, IRON, RETICCTPCT in the last 72 hours. Urine analysis:    Component Value Date/Time   COLORURINE YELLOW 04/08/2016 1449   APPEARANCEUR CLOUDY (A) 04/08/2016 1449   LABSPEC 1.016 04/08/2016 1449   LABSPEC 1.010 05/19/2015 1231   PHURINE 5.0 04/08/2016 1449   GLUCOSEU NEGATIVE 04/08/2016 1449   GLUCOSEU Negative 05/19/2015 1231   HGBUR SMALL (A) 04/08/2016 1449   BILIRUBINUR NEGATIVE 04/08/2016 1449   BILIRUBINUR Negative 05/19/2015 1231   KETONESUR NEGATIVE 04/08/2016 1449   PROTEINUR 30 (A) 04/08/2016 1449   UROBILINOGEN 0.2 05/19/2015 1231   NITRITE NEGATIVE 04/08/2016 1449   LEUKOCYTESUR LARGE (A) 04/08/2016 1449  LEUKOCYTESUR Negative 05/19/2015 1231   Sepsis Labs: @LABRCNTIP (procalcitonin:4,lacticidven:4)    MRSA PCR Screening     Status: Abnormal   Collection Time: 04/08/16 12:10 AM  Result Value Ref Range Status   MRSA by PCR POSITIVE (A) NEGATIVE Final  C difficile quick scan w PCR reflex     Status: Abnormal   Collection Time: 04/08/16  4:40 PM  Result Value Ref Range Status   C Diff antigen POSITIVE (A) NEGATIVE Final   C Diff toxin POSITIVE (A) NEGATIVE Final   C Diff interpretation Toxin producing C. difficile detected.  Final      Radiology Studies: Dg Chest 2 View Result Date: 04/08/2016 Aortic atherosclerosis. Mild left basilar atelectasis or infiltrate is noted with mild left pleural effusion.   Ct Abdomen Pelvis W Contrast Result Date: 9/28/20171. Diffuse acute colitis. Favor infectious colitis and consider C. difficile colitis. 2. Superimposed anasarca. Moderate layering left and small layering right pleural effusions. Associated lung base atelectasis but difficult to exclude developing pneumonia in the lingula and at the right lung base. 3. Chronic severe diffuse calcified atherosclerosis throughout the abdomen and pelvis. No definite large vessel occlusion. 4. Small fluid containing right femoral hernia suspected and new since February.     Scheduled Meds: . amiodarone  100 mg Oral Daily  . aspirin EC  81 mg Oral Daily  . atorvastatin  80 mg Oral QHS  . calcium-vitamin D  1 tablet Oral Q breakfast  . carvedilol  12.5 mg Oral BID WC  . ferrous sulfate  325 mg Oral TID WC  . furosemide  40 mg Oral Daily  . heparin  5,000 Units Subcutaneous Q8H  . hydrALAZINE  75 mg Oral QID  . insulin aspart  0-15 Units Subcutaneous Q4H  . isosorbide mononitrate  120 mg Oral Daily  . metronidazole  500 mg Intravenous Q8H  . sodium chloride flush  3 mL Intravenous Q12H  . tamsulosin  0.4 mg Oral QHS  . vancomycin  125 mg Oral QID   Continuous Infusions:    LOS: 2 days    Time spent: 15 minutes  Greater than 50% of the time spent on counseling and coordinating the care.   Leisa Lenz, MD [facility] Pager [phone]  If 7PM-7AM, please contact night-coverage [email or web address] Password [email or web address] 04/11/2016, 1:56 PM

## 2016-04-12 ENCOUNTER — Encounter (HOSPITAL_COMMUNITY): Payer: Self-pay | Admitting: *Deleted

## 2016-04-12 ENCOUNTER — Encounter (HOSPITAL_COMMUNITY)
Admission: EM | Disposition: A | Discharge status: Home / Self Care | Payer: Self-pay | Source: Home / Self Care | Attending: Internal Medicine

## 2016-04-12 ENCOUNTER — Inpatient Hospital Stay (HOSPITAL_COMMUNITY): Payer: Medicare Other

## 2016-04-12 DIAGNOSIS — I340 Nonrheumatic mitral (valve) insufficiency: Secondary | ICD-10-CM

## 2016-04-12 HISTORY — PX: TEE WITHOUT CARDIOVERSION: SHX5443

## 2016-04-12 LAB — GLUCOSE, CAPILLARY
GLUCOSE-CAPILLARY: 127 mg/dL — AB (ref 65–99)
GLUCOSE-CAPILLARY: 197 mg/dL — AB (ref 65–99)
Glucose-Capillary: 73 mg/dL (ref 65–99)
Glucose-Capillary: 87 mg/dL (ref 65–99)
Glucose-Capillary: 92 mg/dL (ref 65–99)
Glucose-Capillary: 100 mg/dL — ABNORMAL HIGH (ref 65–99)
Glucose-Capillary: 164 mg/dL — ABNORMAL HIGH (ref 65–99)

## 2016-04-12 SURGERY — ECHOCARDIOGRAM, TRANSESOPHAGEAL
Anesthesia: Moderate Sedation

## 2016-04-12 MED ORDER — SODIUM CHLORIDE 0.9 % IV SOLN: INTRAVENOUS | Status: DC

## 2016-04-12 MED ORDER — CHLORHEXIDINE GLUCONATE CLOTH 2 % EX PADS
6.0000 | MEDICATED_PAD | Freq: Every day | CUTANEOUS | Status: DC
  Administered 2016-04-13 – 2016-04-15 (×3): 6 via TOPICAL

## 2016-04-12 MED ORDER — BUTAMBEN-TETRACAINE-BENZOCAINE 2-2-14 % EX AERO
INHALATION_SPRAY | CUTANEOUS | Status: DC | PRN
  Administered 2016-04-12: 2 via TOPICAL

## 2016-04-12 MED ORDER — DEXTROSE-NACL 5-0.9 % IV SOLN
INTRAVENOUS | Status: DC
  Administered 2016-04-12: 11:00:00 via INTRAVENOUS

## 2016-04-12 MED ORDER — MIDAZOLAM HCL 10 MG/2ML IJ SOLN
INTRAMUSCULAR | Status: DC | PRN
  Administered 2016-04-12: 2 mg via INTRAVENOUS
  Administered 2016-04-12: 1 mg via INTRAVENOUS
  Administered 2016-04-12: 2 mg via INTRAVENOUS

## 2016-04-12 MED ORDER — MUPIROCIN 2 % EX OINT
1.0000 "application " | TOPICAL_OINTMENT | Freq: Two times a day (BID) | CUTANEOUS | Status: DC
  Administered 2016-04-12 – 2016-04-16 (×8): 1 via NASAL
  Filled 2016-04-12 (×3): qty 22

## 2016-04-12 MED ORDER — FENTANYL CITRATE (PF) 100 MCG/2ML IJ SOLN
INTRAMUSCULAR | Status: AC
  Filled 2016-04-12: qty 2

## 2016-04-12 MED ORDER — MIDAZOLAM HCL 5 MG/ML IJ SOLN
INTRAMUSCULAR | Status: AC
  Filled 2016-04-12: qty 2

## 2016-04-12 MED ORDER — DEXTROSE-NACL 5-0.9 % IV SOLN
INTRAVENOUS | Status: DC
  Filled 2016-04-12: qty 1000

## 2016-04-12 MED ORDER — FENTANYL CITRATE (PF) 100 MCG/2ML IJ SOLN
INTRAMUSCULAR | Status: DC | PRN
  Administered 2016-04-12 (×2): 25 ug via INTRAVENOUS

## 2016-04-12 NOTE — CV Procedure (Signed)
     Transesophageal Echocardiogram Note  [name] [id number] 10-04-1935  Procedure: Transesophageal Echocardiogram Indications: Bacteremia  Procedure Details Consent: Obtained Time Out: Verified patient identification, verified procedure, site/side was marked, verified correct patient position, special equipment/implants available, Radiology Safety Procedures followed,  medications/allergies/relevent history reviewed, required imaging and test results available.  Performed  Medications: Fentanyl: 50 mcg Versed: 6 mg  - Left ventricle: Wall thickness was increased in a pattern of   moderate LVH. Systolic function was normal. The estimated   ejection fraction was in the range of 55% to 60%. Wall motion was   normal; there were no regional wall motion abnormalities. - Aortic valve: There was trivial regurgitation. - Aorta: Severe non-mobile atherosclerotic plaque. - Ascending aorta: The ascending aorta was normal in size. - Mitral valve: There was mild regurgitation. - Left atrium: The atrium was dilated. No evidence of thrombus in   the atrial cavity or appendage. - Right ventricle: Systolic function was normal. - Right atrium: No evidence of thrombus in the atrial cavity or   appendage. - Atrial septum: No defect or patent foramen ovale was identified. - Tricuspid valve: There was mild-moderate regurgitation.  Impressions:  - Tricuspid valve is significantly thickened, predominantly the   posterior leaflet. There is a mobile echodensity measuring 1.6 x   1.5 cm attached to the posterior leaflet suspicious for a   vegetation.   Complications: No apparent complications Patient did tolerate procedure well.  [name], MD, [facility] 04/12/2016, 12:40 PM

## 2016-04-12 NOTE — Progress Notes (Signed)
Patient transferred via carelink from [facility] endoscopy back to [place] after TEE.

## 2016-04-12 NOTE — Care Management Important Message (Signed)
Important Message  Patient Details  Name: [name] RANDLES Sr. MRN: [id number] Date of Birth: 09-Aug-1935   Medicare Important Message Given:  Yes    Purcell Mouton, RN 04/12/2016, 3:58 PM

## 2016-04-12 NOTE — Progress Notes (Signed)
MD updated concerning TEE report. SRP, RN

## 2016-04-12 NOTE — Progress Notes (Addendum)
Patient ID: [name] Brow., male   DOB: 12-16-1935, 80 y.o.   MRN: [id number]  PROGRESS NOTE    [name]  [id number] DOB: May 07, 1936 DOA: 04/08/2016  PCP: [name], MD   Brief Narrative:  80 year old male with past medical history significant for atrial fibrillation on aspirin and amiodarone, coronary artery disease status post CABG, history of C. difficile colitis, dyslipidemia, hypertension, diabetes mellitus who presented to [facility] long ED with abdominal pain, decreased appetite, diarrhea. Patient also had reports of associated shortness of breath. Symptoms started 2 days prior to the admission.  In ED, patients blood pressure as high as 237/97 which then improved to 158/61. Blood work was notable for leukocytosis of 12.3, hemoglobin 11, creatinine 1.59 (baseline creatinine 1.9 about 2 weeks ago). The troponin level was 0.07. The 12-lead EKG showed inverted T waves in lateral leads. Patient did not have reports of chest pain. Chest x-ray showed aortic atherosclerosis with left basilar atelectasis or infiltrate. CT abdomen showed a diffuse acute colitis favoring infectious colitis and possibly C. difficile colitis. Patient also had notable moderate layering left and small layering right pleural effusions. He was started empirically on Cipro and Flagyl. Stool for C. difficile came back positive for which reason vanco PO added.  Assessment & Plan:   Active Problems: Abdominal pain / acute diffuse C. difficile colitis / Leukocytosis  - Stool for C. difficile positive for C. difficile antigen and toxin - Patient has had previous history of C. 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 (HCC) - Continue sliding scale insulin    Essential hypertension - Continue coreg, imdur, hydralazine, lasix     Dyslipidemia associated with that 2 diabetes mellitus - Continue Lipitor 80 mg at bedtime    Hypokalemia - Due to Lasix - Supplemented    Mild troponin elevation / Possible vegetation on TV - Troponin level 0.07, 0.08 - Patient has no reports of chest pain - 2 D ECHO with possible vegetation on TV - Appreciate cardio scheduling TEE for today     Chronic kidney disease stage III - Baseline creatinine 1.9 about 2 weeks ago - Creatinine on this admission within baseline values    Anemia of chronic disease - Secondary to CKD - Hemoglobin stable       DVT prophylaxis: Heparin subcutaneous Code Status: full code  Family Communication: Family not at the bedside, called pt wife and gave an update on plan of care Disposition Plan: Plan for TEE monday   Consultants:   Cardio for sch TEE  Procedures:   2 D ECHO -  EF 55%, ? Vegetation on TV  Antimicrobials:   Cipro 04/08/2016 --> 04/09/2016  Flagyl 04/08/2016 -->  Vancomycin by mouth 04/08/2016-->     Subjective: No overnight events  Objective: Vitals:   04/11/16 0423 04/11/16 1425 04/11/16 2015 04/12/16 0439  BP: (!) 153/76 (!) 151/63 (!) 168/66 (!) 155/65  Pulse: 65 81 82 65  Resp: 18 18 18 16   Temp: 98.5 F (36.9 C) 99 F (37.2 C) 98 F (36.7 C) 98.5 F (36.9 C)  TempSrc: Oral Oral Oral Oral  SpO2: 100% 99% 98% 96%  Weight:      Height:        Intake/Output Summary (Last 24 hours) at 04/12/16 0917 Last data filed at 04/12/16 0025  Gross per 24 hour  Intake              660 ml  Output                0 ml  Net              660 ml   Filed Weights   04/09/16 0542  Weight: 79.6 kg (175 lb 7.8 oz)    Examination:  General exam: No distress, calm  And comfortable  Respiratory system: No wheezing, no  rhonchi Cardiovascular system: S1 & S2 heard, Rate controlled  Gastrointestinal system: (+) BS, non tender, non distended  Central nervous system: No focal deficits  Extremities: Pulses palpable, no edema Skin: warm and dry  Psychiatry: Normal mood and behavior   Data Reviewed: I have personally reviewed following labs and imaging studies  CBC:  Recent Labs Lab 04/08/16 1503 04/09/16 0530 04/10/16 0527 04/11/16 0528  WBC 12.3* 13.6* 7.2 6.5  HGB 11.0* 9.6* 8.2* 8.8*  HCT 35.3* 30.4* 26.6* 27.8*  MCV 97.0 96.8 94.3 96.2  PLT 182 141* 129* Q000111Q*   Basic Metabolic Panel:  Recent Labs Lab 04/07/16 04/08/16 1503 04/09/16 0530 04/10/16 0527  NA 145 140 142 138  K 3.6 3.8 3.0* 3.5  CL  --  109 112* 111  CO2  --  24 23 23   GLUCOSE  --  152* 86 80  BUN 21 22* 22* 25*  CREATININE 1.4* 1.59* 1.49* 2.10*  CALCIUM  --  8.8* 8.5* 8.1*   GFR: Estimated Creatinine Clearance: 29.9 mL/min (by C-G formula based on SCr of 2.1 mg/dL (H)). Liver Function Tests:  Recent Labs Lab 04/08/16 1503 04/09/16 0530  AST 24 18  ALT 10* 8*  ALKPHOS 58 47  BILITOT 0.6 0.5  PROT 7.9 6.3*  ALBUMIN 3.4* 2.7*    Recent Labs Lab 04/08/16 1503  LIPASE 31   No results for input(s): AMMONIA in the last 168 hours. Coagulation Profile:  Recent Labs Lab 04/08/16 1503  INR 1.00   Cardiac Enzymes:  Recent Labs Lab 04/08/16 2303 04/09/16 0530 04/09/16 1110  TROPONINI 0.07* 0.08* 0.07*   BNP (last 3 results) No results for input(s): PROBNP in the last 8760 hours. HbA1C: No results for input(s): HGBA1C in the last 72 hours. CBG:  Recent Labs Lab 04/11/16 1643 04/11/16 2018 04/12/16 0020 04/12/16 0447 04/12/16 0808  GLUCAP 134* 197* 127* 73 87   Lipid Profile: No results for input(s): CHOL, HDL, LDLCALC, TRIG, CHOLHDL, LDLDIRECT in the last 72 hours. Thyroid Function Tests: No results for input(s): TSH, T4TOTAL, FREET4, T3FREE, THYROIDAB in the last 72 hours. Anemia  Panel: No results for input(s): VITAMINB12, FOLATE, FERRITIN, TIBC, IRON, RETICCTPCT in the last 72 hours. Urine analysis:    Component Value Date/Time   COLORURINE YELLOW 04/08/2016 1449   APPEARANCEUR CLOUDY (A) 04/08/2016 1449   LABSPEC 1.016 04/08/2016 1449   LABSPEC 1.010 05/19/2015 1231   PHURINE 5.0 04/08/2016 1449   GLUCOSEU NEGATIVE 04/08/2016 1449   GLUCOSEU Negative 05/19/2015 1231   HGBUR SMALL (A) 04/08/2016 1449   BILIRUBINUR NEGATIVE 04/08/2016 1449   BILIRUBINUR Negative 05/19/2015 1231   KETONESUR NEGATIVE 04/08/2016 1449   PROTEINUR 30 (A) 04/08/2016 1449   UROBILINOGEN 0.2 05/19/2015 1231   NITRITE NEGATIVE  04/08/2016 1449   LEUKOCYTESUR LARGE (A) 04/08/2016 1449   LEUKOCYTESUR Negative 05/19/2015 1231   Sepsis Labs: @LABRCNTIP (procalcitonin:4,lacticidven:4)    MRSA PCR Screening     Status: Abnormal   Collection Time: 04/08/16 12:10 AM  Result Value Ref Range Status   MRSA by PCR POSITIVE (A) NEGATIVE Final  C difficile quick scan w PCR reflex     Status: Abnormal   Collection Time: 04/08/16  4:40 PM  Result Value Ref Range Status   C Diff antigen POSITIVE (A) NEGATIVE Final   C Diff toxin POSITIVE (A) NEGATIVE Final   C Diff interpretation Toxin producing C. difficile detected.  Final      Radiology Studies: Dg Chest 2 View Result Date: 04/08/2016 Aortic atherosclerosis. Mild left basilar atelectasis or infiltrate is noted with mild left pleural effusion.   Ct Abdomen Pelvis W Contrast Result Date: 9/28/20171. Diffuse acute colitis. Favor infectious colitis and consider C. difficile colitis. 2. Superimposed anasarca. Moderate layering left and small layering right pleural effusions. Associated lung base atelectasis but difficult to exclude developing pneumonia in the lingula and at the right lung base. 3. Chronic severe diffuse calcified atherosclerosis throughout the abdomen and pelvis. No definite large vessel occlusion. 4. Small fluid containing  right femoral hernia suspected and new since February.    Scheduled Meds: . amiodarone  100 mg Oral Daily  . aspirin EC  81 mg Oral Daily  . atorvastatin  80 mg Oral QHS  . calcium-vitamin D  1 tablet Oral Q breakfast  . carvedilol  12.5 mg Oral BID WC  . ferrous sulfate  325 mg Oral TID WC  . furosemide  40 mg Oral Daily  . heparin  5,000 Units Subcutaneous Q8H  . hydrALAZINE  75 mg Oral QID  . insulin aspart  0-15 Units Subcutaneous Q4H  . isosorbide mononitrate  120 mg Oral Daily  . metronidazole  500 mg Intravenous Q8H  . sodium chloride flush  3 mL Intravenous Q12H  . tamsulosin  0.4 mg Oral QHS  . vancomycin  125 mg Oral QID   Continuous Infusions: . dextrose 5 % and 0.9% NaCl 1,000 mL infusion       LOS: 3 days    Time spent: 15 minutes  Greater than 50% of the time spent on counseling and coordinating the care.   Leisa Lenz, MD [facility] Pager [phone]  If 7PM-7AM, please contact night-coverage [email or web address] Password [email or web address] 04/12/2016, 9:17 AM

## 2016-04-12 NOTE — Progress Notes (Signed)
  Echocardiogram Echocardiogram Transesophageal has been performed.  Bobbye Charleston 04/12/2016, 3:21 PM

## 2016-04-12 NOTE — Interval H&P Note (Signed)
History and Physical Interval Note:  04/12/2016 12:39 PM  [name] D Isley Sr.  has presented today for surgery, with the diagnosis of possible veg  The various methods of treatment have been discussed with the patient and family. After consideration of risks, benefits and other options for treatment, the patient has consented to  Procedure(s): TRANSESOPHAGEAL ECHOCARDIOGRAM (TEE) (N/A) as a surgical intervention .  The patient's history has been reviewed, patient examined, no change in status, stable for surgery.  I have reviewed the patient's chart and labs.  Questions were answered to the patient's satisfaction.     [name]

## 2016-04-12 NOTE — H&P (View-Only) (Signed)
Patient ID: [name]., male   DOB: 1936/01/24, 80 y.o.   MRN: [id number]  PROGRESS NOTE    [name]  [id number] DOB: 03/13/1936 DOA: 04/08/2016  PCP: [name], MD   Brief Narrative:  80 year old male with past medical history significant for atrial fibrillation on aspirin and amiodarone, coronary artery disease status post CABG, history of C. difficile colitis, dyslipidemia, hypertension, diabetes mellitus who presented to [facility] long ED with abdominal pain, decreased appetite, diarrhea. Patient also had reports of associated shortness of breath. Symptoms started 2 days prior to the admission.  In ED, patients blood pressure as high as 237/97 which then improved to 158/61. Blood work was notable for leukocytosis of 12.3, hemoglobin 11, creatinine 1.59 (baseline creatinine 1.9 about 2 weeks ago). The troponin level was 0.07. The 12-lead EKG showed inverted T waves in lateral leads. Patient did not have reports of chest pain. Chest x-ray showed aortic atherosclerosis with left basilar atelectasis or infiltrate. CT abdomen showed a diffuse acute colitis favoring infectious colitis and possibly C. difficile colitis. Patient also had notable moderate layering left and small layering right pleural effusions. He was started empirically on Cipro and Flagyl. Stool for C. difficile came back positive for which reason vanco PO added.  Assessment & Plan:   Active Problems: Abdominal pain / acute diffuse C. difficile colitis / Leukocytosis  - Stool for C. difficile positive for C. difficile antigen and toxin - Patient has had previous history of C. 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 (HCC) - Continue sliding scale insulin    Essential hypertension - Continue coreg, imdur, hydralazine, lasix     Dyslipidemia associated with that 2 diabetes mellitus - Continue Lipitor 80 mg at bedtime    Hypokalemia - Due to Lasix - Supplemented    Mild troponin elevation / Possible vegetation on TV - Troponin level 0.07, 0.08 - Patient has no reports of chest pain - 2 D ECHO with possible vegetation on TV - Appreciate cardio scheduling TEE for today     Chronic kidney disease stage III - Baseline creatinine 1.9 about 2 weeks ago - Creatinine on this admission within baseline values    Anemia of chronic disease - Secondary to CKD - Hemoglobin stable       DVT prophylaxis: Heparin subcutaneous Code Status: full code  Family Communication: Family not at the bedside, called pt wife and gave an update on plan of care Disposition Plan: Plan for TEE monday   Consultants:   Cardio for sch TEE  Procedures:   2 D ECHO -  EF 55%, ? Vegetation on TV  Antimicrobials:   Cipro 04/08/2016 --> 04/09/2016  Flagyl 04/08/2016 -->  Vancomycin by mouth 04/08/2016-->     Subjective: No overnight events  Objective: Vitals:   04/11/16 0423 04/11/16 1425 04/11/16 2015 04/12/16 0439  BP: (!) 153/76 (!) 151/63 (!) 168/66 (!) 155/65  Pulse: 65 81 82 65  Resp: 18 18 18 16   Temp: 98.5 F (36.9 C) 99 F (37.2 C) 98 F (36.7 C) 98.5 F (36.9 C)  TempSrc: Oral Oral Oral Oral  SpO2: 100% 99% 98% 96%  Weight:      Height:        Intake/Output Summary (Last 24 hours) at 04/12/16 0917 Last data filed at 04/12/16 0025  Gross per 24 hour  Intake              660 ml  Output                0 ml  Net              660 ml   Filed Weights   04/09/16 0542  Weight: 79.6 kg (175 lb 7.8 oz)    Examination:  General exam: No distress, calm  And comfortable  Respiratory system: No wheezing, no  rhonchi Cardiovascular system: S1 & S2 heard, Rate controlled  Gastrointestinal system: (+) BS, non tender, non distended  Central nervous system: No focal deficits  Extremities: Pulses palpable, no edema Skin: warm and dry  Psychiatry: Normal mood and behavior   Data Reviewed: I have personally reviewed following labs and imaging studies  CBC:  Recent Labs Lab 04/08/16 1503 04/09/16 0530 04/10/16 0527 04/11/16 0528  WBC 12.3* 13.6* 7.2 6.5  HGB 11.0* 9.6* 8.2* 8.8*  HCT 35.3* 30.4* 26.6* 27.8*  MCV 97.0 96.8 94.3 96.2  PLT 182 141* 129* Q000111Q*   Basic Metabolic Panel:  Recent Labs Lab 04/07/16 04/08/16 1503 04/09/16 0530 04/10/16 0527  NA 145 140 142 138  K 3.6 3.8 3.0* 3.5  CL  --  109 112* 111  CO2  --  24 23 23   GLUCOSE  --  152* 86 80  BUN 21 22* 22* 25*  CREATININE 1.4* 1.59* 1.49* 2.10*  CALCIUM  --  8.8* 8.5* 8.1*   GFR: Estimated Creatinine Clearance: 29.9 mL/min (by C-G formula based on SCr of 2.1 mg/dL (H)). Liver Function Tests:  Recent Labs Lab 04/08/16 1503 04/09/16 0530  AST 24 18  ALT 10* 8*  ALKPHOS 58 47  BILITOT 0.6 0.5  PROT 7.9 6.3*  ALBUMIN 3.4* 2.7*    Recent Labs Lab 04/08/16 1503  LIPASE 31   No results for input(s): AMMONIA in the last 168 hours. Coagulation Profile:  Recent Labs Lab 04/08/16 1503  INR 1.00   Cardiac Enzymes:  Recent Labs Lab 04/08/16 2303 04/09/16 0530 04/09/16 1110  TROPONINI 0.07* 0.08* 0.07*   BNP (last 3 results) No results for input(s): PROBNP in the last 8760 hours. HbA1C: No results for input(s): HGBA1C in the last 72 hours. CBG:  Recent Labs Lab 04/11/16 1643 04/11/16 2018 04/12/16 0020 04/12/16 0447 04/12/16 0808  GLUCAP 134* 197* 127* 73 87   Lipid Profile: No results for input(s): CHOL, HDL, LDLCALC, TRIG, CHOLHDL, LDLDIRECT in the last 72 hours. Thyroid Function Tests: No results for input(s): TSH, T4TOTAL, FREET4, T3FREE, THYROIDAB in the last 72 hours. Anemia  Panel: No results for input(s): VITAMINB12, FOLATE, FERRITIN, TIBC, IRON, RETICCTPCT in the last 72 hours. Urine analysis:    Component Value Date/Time   COLORURINE YELLOW 04/08/2016 1449   APPEARANCEUR CLOUDY (A) 04/08/2016 1449   LABSPEC 1.016 04/08/2016 1449   LABSPEC 1.010 05/19/2015 1231   PHURINE 5.0 04/08/2016 1449   GLUCOSEU NEGATIVE 04/08/2016 1449   GLUCOSEU Negative 05/19/2015 1231   HGBUR SMALL (A) 04/08/2016 1449   BILIRUBINUR NEGATIVE 04/08/2016 1449   BILIRUBINUR Negative 05/19/2015 1231   KETONESUR NEGATIVE 04/08/2016 1449   PROTEINUR 30 (A) 04/08/2016 1449   UROBILINOGEN 0.2 05/19/2015 1231   NITRITE NEGATIVE  04/08/2016 1449   LEUKOCYTESUR LARGE (A) 04/08/2016 1449   LEUKOCYTESUR Negative 05/19/2015 1231   Sepsis Labs: @LABRCNTIP (procalcitonin:4,lacticidven:4)    MRSA PCR Screening     Status: Abnormal   Collection Time: 04/08/16 12:10 AM  Result Value Ref Range Status   MRSA by PCR POSITIVE (A) NEGATIVE Final  C difficile quick scan w PCR reflex     Status: Abnormal   Collection Time: 04/08/16  4:40 PM  Result Value Ref Range Status   C Diff antigen POSITIVE (A) NEGATIVE Final   C Diff toxin POSITIVE (A) NEGATIVE Final   C Diff interpretation Toxin producing C. difficile detected.  Final      Radiology Studies: Dg Chest 2 View Result Date: 04/08/2016 Aortic atherosclerosis. Mild left basilar atelectasis or infiltrate is noted with mild left pleural effusion.   Ct Abdomen Pelvis W Contrast Result Date: 9/28/20171. Diffuse acute colitis. Favor infectious colitis and consider C. difficile colitis. 2. Superimposed anasarca. Moderate layering left and small layering right pleural effusions. Associated lung base atelectasis but difficult to exclude developing pneumonia in the lingula and at the right lung base. 3. Chronic severe diffuse calcified atherosclerosis throughout the abdomen and pelvis. No definite large vessel occlusion. 4. Small fluid containing  right femoral hernia suspected and new since February.    Scheduled Meds: . amiodarone  100 mg Oral Daily  . aspirin EC  81 mg Oral Daily  . atorvastatin  80 mg Oral QHS  . calcium-vitamin D  1 tablet Oral Q breakfast  . carvedilol  12.5 mg Oral BID WC  . ferrous sulfate  325 mg Oral TID WC  . furosemide  40 mg Oral Daily  . heparin  5,000 Units Subcutaneous Q8H  . hydrALAZINE  75 mg Oral QID  . insulin aspart  0-15 Units Subcutaneous Q4H  . isosorbide mononitrate  120 mg Oral Daily  . metronidazole  500 mg Intravenous Q8H  . sodium chloride flush  3 mL Intravenous Q12H  . tamsulosin  0.4 mg Oral QHS  . vancomycin  125 mg Oral QID   Continuous Infusions: . dextrose 5 % and 0.9% NaCl 1,000 mL infusion       LOS: 3 days    Time spent: 15 minutes  Greater than 50% of the time spent on counseling and coordinating the care.   Leisa Lenz, MD [facility] Pager [phone]  If 7PM-7AM, please contact night-coverage [email or web address] Password [email or web address] 04/12/2016, 9:17 AM

## 2016-04-12 NOTE — Progress Notes (Signed)
LCSW received consult that patient is from [facility] with a plan for discharge the day after he was admitted.  LCSW met with patient and spoke with facility regarding possible return. Patient reports he had wonderful care at facility, but at this time he plans to return home and looks forward to home.  LCSW updated facility and they are in agreement.  DC plan: Home.  Lane Hacker, MSW Clinical Social Work: [employer] Coverage for :  [employer]

## 2016-04-13 ENCOUNTER — Encounter (HOSPITAL_COMMUNITY): Payer: Self-pay | Admitting: Cardiology

## 2016-04-13 DIAGNOSIS — E1151 Type 2 diabetes mellitus with diabetic peripheral angiopathy without gangrene: Secondary | ICD-10-CM

## 2016-04-13 DIAGNOSIS — Z87891 Personal history of nicotine dependence: Secondary | ICD-10-CM

## 2016-04-13 DIAGNOSIS — Z888 Allergy status to other drugs, medicaments and biological substances status: Secondary | ICD-10-CM

## 2016-04-13 DIAGNOSIS — Z794 Long term (current) use of insulin: Secondary | ICD-10-CM

## 2016-04-13 DIAGNOSIS — Z7982 Long term (current) use of aspirin: Secondary | ICD-10-CM

## 2016-04-13 DIAGNOSIS — Z886 Allergy status to analgesic agent status: Secondary | ICD-10-CM

## 2016-04-13 DIAGNOSIS — Z951 Presence of aortocoronary bypass graft: Secondary | ICD-10-CM

## 2016-04-13 DIAGNOSIS — Z79899 Other long term (current) drug therapy: Secondary | ICD-10-CM

## 2016-04-13 DIAGNOSIS — I2510 Atherosclerotic heart disease of native coronary artery without angina pectoris: Secondary | ICD-10-CM

## 2016-04-13 DIAGNOSIS — E1122 Type 2 diabetes mellitus with diabetic chronic kidney disease: Secondary | ICD-10-CM

## 2016-04-13 DIAGNOSIS — Z8619 Personal history of other infectious and parasitic diseases: Secondary | ICD-10-CM

## 2016-04-13 DIAGNOSIS — B9689 Other specified bacterial agents as the cause of diseases classified elsewhere: Secondary | ICD-10-CM

## 2016-04-13 DIAGNOSIS — N184 Chronic kidney disease, stage 4 (severe): Secondary | ICD-10-CM

## 2016-04-13 DIAGNOSIS — I330 Acute and subacute infective endocarditis: Principal | ICD-10-CM

## 2016-04-13 LAB — GLUCOSE, CAPILLARY
GLUCOSE-CAPILLARY: 131 mg/dL — AB (ref 65–99)
GLUCOSE-CAPILLARY: 169 mg/dL — AB (ref 65–99)
GLUCOSE-CAPILLARY: 97 mg/dL (ref 65–99)
Glucose-Capillary: 82 mg/dL (ref 65–99)
Glucose-Capillary: 141 mg/dL — ABNORMAL HIGH (ref 65–99)
Glucose-Capillary: 147 mg/dL — ABNORMAL HIGH (ref 65–99)

## 2016-04-13 LAB — URIC ACID: Uric Acid, Serum: 6.8 mg/dL (ref 4.4–7.6)

## 2016-04-13 MED ORDER — HYDROCODONE-ACETAMINOPHEN 5-325 MG PO TABS
1.0000 | ORAL_TABLET | ORAL | Status: DC | PRN
  Administered 2016-04-13 – 2016-04-14 (×4): 1 via ORAL
  Filled 2016-04-13 (×4): qty 1

## 2016-04-13 NOTE — Consult Note (Signed)
[facility]  Date of Admission:  04/08/2016  Date of Consult:  04/13/2016  Reason for Consult: Endocarditis, C diff Referring Physician: Charlies Silvers  Impression/Recommendation Endocarditis, TV Await his BCx (times 3) Would consider restarting ancef after his BCx.  I am very suspicous he has endocarditis from his prev bacteremia or from his recent hospitalization. Prior endocarditis is a risk factor for repeat endocarditis.  He denies remaining hardware.    C diff Would give him 14 days of po vanco Stop flagyl  Thank you so much for this interesting consult,   Bobby Rumpf (pager) [phone] [email or web address]  Tiandre Elliston. is an 80 y.o. male.  HPI: 52 y M with hx of afib, CAD/CABG (2013), PVD, DM2 (since 1998), MDS, radiation cystitis with chronic hematuria and stage 4 CKD. He was adm to hospital 7-7 to 7-25 with multifocal pneumonia, MSSA bacteremia and pacemaker endocarditis. He had a negative TEE 7-17. He had his pacer removed on 7-18 and was d/c to SNF on ancef and rifampin with an end date of 8-3.  His repeat BCx on 7-8 were (-).   He was last adm to hospital on 8-11 to 8-15 with new seizures. He was also found to have Hgb of 3.4 with hematuria (chronic).  His sz w/u was negative.  He was dx at his SNF with C diff in the intervening period.  He returns to hospital on 9-28 with worsening abd pain and SOB. He was afebrile, he had a WBC of 12.3. He had a C CT scan of abd showing acute colitis. He was started on cipro/flagyl then changed to po vanco/IV flagyl. His C diff was +.  His course is also noted for t wave inversions and significant htn. Due to CHF he underwent TTE which showed a tricuspid vegetation, he then had a TEE on 10-2 showing: Tricuspid valve is significantly thickened, predominantly the posterior leaflet. There is a mobile echodensity measuring 1.6 x 1.5 cm attached to the posterior leaflet suspicious for a vegetation. He has  BCx pending.   Past Medical History:  Diagnosis Date  . Angiomyolipoma 2009   On both kidneys noted in 2009  . Arthritis    neck and left wrist  . Atrial fibrillation ([place])   . CAD (coronary artery disease)    s/b CABG 1994, and subsequent stents. Repeat CABG 12/2011,  . Chronic diastolic heart failure ([place])   . CKD (chronic kidney disease)   . Clostridium difficile diarrhea 03/2016  . Depressive disorder   . Diabetes mellitus type 2 with peripheral artery disease (HCC)    DIET CONTROLLED  . DVT (deep venous thrombosis) ([place]) 2011   Right arm  . Gastroparesis   . GERD (gastroesophageal reflux disease)   . Gout   . History of hiatal hernia   . Hyperlipidemia   . Hypertension   . Internal hemorrhoids without mention of complication   . Ischemic colitis ([place])   . 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 Myelodysplastic syndrome ([place]) 05/22/2013   With low hemoglobin and platelets treated with Procrit  . Osteopenia   . Peptic ulcer    S/p partial gastrectomy in 1969  . Peripheral artery disease ([place])   . Pneumonia 01/16/2016  . Pneumonia 03/2016  . Presence of permanent cardiac pacemaker   . Prostate cancer ([place]) 1997   XRT and lupron  . Renal artery stenosis ([place])   . Sick sinus syndrome ([place])   . Vitamin B 12 deficiency  Past Surgical History:  Procedure Laterality Date  . ABDOMINAL ANGIOGRAM N/A 05/25/2011   Procedure: ABDOMINAL ANGIOGRAM;  Surgeon: Serafina Mitchell, MD;  Location: [facility] CATH LAB;  Service: Cardiovascular;  Laterality: N/A;  . ABDOMINAL ANGIOGRAM N/A 02/13/2013   Procedure: ABDOMINAL ANGIOGRAM;  Surgeon: Serafina Mitchell, MD;  Location: [facility] CATH LAB;  Service: Cardiovascular;  Laterality: N/A;  . APPENDECTOMY  1991  . CELIAC ARTERY ANGIOPLASTY  05-16-12   and stenting  . CHOLECYSTECTOMY  Oct 2009   Laparoscopic  . CORONARY ARTERY BYPASS GRAFT  01/22/1993  . CORONARY ARTERY BYPASS GRAFT  01/03/2012   Procedure: REDO CORONARY ARTERY BYPASS GRAFTING (CABG);  Surgeon:  Gaye Pollack, MD;  Location: [place];  Service: Open Heart Surgery;  Laterality: N/A;  Redo CABG x  using bilateral internal mammary arteries;  left leg greater saphenous vein harvested endoscopically  . FOREIGN BODY REMOVAL ABDOMINAL Right 01/27/2016   Procedure: EXPOSURE OF RIGHT COMMON FEMORAL ARTERY AND REMOVAL FOREIGN;  Surgeon: Evans Lance, MD;  Location: [place];  Service: Cardiovascular;  Laterality: Right;  . HIATAL HERNIA REPAIR     and ulcer repair  . I&D EXTREMITY Left 01/21/2016   Procedure: IRRIGATION AND DEBRIDEMENT EXTREMITY;  Surgeon: Milly Jakob, MD;  Location: [place];  Service: Orthopedics;  Laterality: Left;  . INGUINAL HERNIA REPAIR Right 10/28/2015   Procedure: OPEN RIGHT INGUINAL HERNIA REPAIR;  Surgeon: Greer Pickerel, MD;  Location: [facility];  Service: General;  Laterality: Right;  . INSERTION OF MESH Right 10/28/2015   Procedure: INSERTION OF MESH;  Surgeon: Greer Pickerel, MD;  Location: [facility];  Service: General;  Laterality: Right;  . IR GENERIC HISTORICAL  02/02/2016   IR FLUORO GUIDE CV LINE LEFT 02/02/2016 Markus Daft, MD [facility]  . IR GENERIC HISTORICAL  02/02/2016   IR US GUIDE VASC ACCESS LEFT 02/02/2016 Markus Daft, MD [facility]  . IR GENERIC HISTORICAL  03/31/2016   IR REMOVAL TUN CV CATH W/O FL 03/31/2016 Arne Cleveland, MD [facility]  . LEFT HEART CATHETERIZATION WITH CORONARY/GRAFT ANGIOGRAM  12/24/2011   Procedure: LEFT HEART CATHETERIZATION WITH Beatrix Fetters;  Surgeon: Sinclair Grooms, MD;  Location: [facility] CATH LAB;  Service: Cardiovascular;;  . LOWER EXTREMITY ANGIOGRAM Bilateral 05/25/2011   Procedure: LOWER EXTREMITY ANGIOGRAM;  Surgeon: Serafina Mitchell, MD;  Location: [facility] CATH LAB;  Service: Cardiovascular;  Laterality: Bilateral;  . OTHER SURGICAL HISTORY  02/13/13   superior mesenteric artery angiogram  . OTHER SURGICAL HISTORY  05/16/12   Stent in stomach  . PACEMAKER GENERATOR CHANGE  12/10/2003   SJM Identity XL DR performed by Dr Leonia Reeves    . PACEMAKER INSERTION  10/18/1994   DDD pacemaker, [employer]. Gen change 12/10/2003.  Marland Kitchen PACEMAKER LEAD REMOVAL Right 01/27/2016   Procedure: PACEMAKER EXTRACTION;  Surgeon: Evans Lance, MD;  Location: [place];  Service: Cardiovascular;  Laterality: Right;  DR. Roxy Manns TO BACKUP CASE  . PARTIAL GASTRECTOMY  1969   Hx of ulcer s/p partial gastrectomy/ has pernicious anemia  . RENAL ANGIOGRAM N/A 02/13/2013   Procedure: RENAL ANGIOGRAM;  Surgeon: Serafina Mitchell, MD;  Location: [facility] CATH LAB;  Service: Cardiovascular;  Laterality: N/A;  . TEE WITHOUT CARDIOVERSION N/A 01/26/2016   Procedure: TRANSESOPHAGEAL ECHOCARDIOGRAM (TEE);  Surgeon: Sueanne Margarita, MD;  Location: [facility] ENDOSCOPY;  Service: Cardiovascular;  Laterality: N/A;  . TEE WITHOUT CARDIOVERSION N/A 01/27/2016   Procedure: TRANSESOPHAGEAL ECHOCARDIOGRAM (TEE);  Surgeon: Evans Lance, MD;  Location:  [place] [facility];  Service: Cardiovascular;  Laterality: N/A;  . THROMBECTOMY / EMBOLECTOMY SUBCLAVIAN ARTERY  02/02/10   Right subclavian thromboectomy and venous angioplasty, and chronic mesenteric ischemia with Herculink stenting to superior mesenteric and celiac arteries - Dr. Trula Slade  . TRANSURETHRAL RESECTION OF PROSTATE N/A 02/22/2016   Procedure: CYSTO, CLOT EVACUATION, FULGERATION;  Surgeon: Alexis Frock, MD;  Location: [facility];  Service: Urology;  Laterality: N/A;  . VISCERAL ANGIOGRAM N/A 05/25/2011   Procedure: VISCERAL ANGIOGRAM;  Surgeon: Serafina Mitchell, MD;  Location: [facility] CATH LAB;  Service: Cardiovascular;  Laterality: N/A;  . VISCERAL ANGIOGRAM Bilateral 12/28/2011   Procedure: VISCERAL ANGIOGRAM;  Surgeon: Serafina Mitchell, MD;  Location: [facility] CATH LAB;  Service: Cardiovascular;  Laterality: Bilateral;  . VISCERAL ANGIOGRAM N/A 05/16/2012   Procedure: VISCERAL ANGIOGRAM;  Surgeon: Serafina Mitchell, MD;  Location: [facility] CATH LAB;  Service: Cardiovascular;  Laterality: N/A;  . VISCERAL ANGIOGRAM N/A 02/13/2013   Procedure: VISCERAL ANGIOGRAM;  Surgeon: Serafina Mitchell, MD;  Location: [facility] CATH LAB;  Service: Cardiovascular;  Laterality: N/A;     Allergies  Allergen Reactions  . Nsaids Nausea Only    GI issue  . Ibuprofen Other (See Comments)    GI Issues  . Ace Inhibitors Cough    Medications:  Scheduled: . amiodarone  100 mg Oral Daily  . aspirin EC  81 mg Oral Daily  . atorvastatin  80 mg Oral QHS  . calcium-vitamin D  1 tablet Oral Q breakfast  . carvedilol  12.5 mg Oral BID WC  . Chlorhexidine Gluconate Cloth  6 each Topical Q0600  . ferrous sulfate  325 mg Oral TID WC  . furosemide  40 mg Oral Daily  . heparin  5,000 Units Subcutaneous Q8H  . hydrALAZINE  75 mg Oral QID  . insulin aspart  0-15 Units Subcutaneous Q4H  . isosorbide mononitrate  120 mg Oral Daily  . metronidazole  500 mg Intravenous Q8H  . mupirocin ointment  1 application Nasal BID  . sodium chloride flush  3 mL Intravenous Q12H  . tamsulosin  0.4 mg Oral QHS  . vancomycin  125 mg Oral QID    Abtx:  Anti-infectives    Start     Dose/Rate Route Frequency Ordered Stop   04/10/16 0000  metroNIDAZOLE (FLAGYL) 500 MG tablet     500 mg Oral 3 times daily 04/10/16 1407     04/10/16 0000  vancomycin (VANCOCIN) 50 mg/mL oral solution     125 mg Oral 4 times daily 04/10/16 1407     04/09/16 1800  vancomycin (VANCOCIN) IVPB 1000 mg/200 mL premix  Status:  Discontinued     1,000 mg 200 mL/hr over 60 Minutes Intravenous Every 24 hours 04/08/16 1700 04/08/16 1822   04/09/16 1000  vancomycin (VANCOCIN) 50 mg/mL oral solution 125 mg     125 mg Oral 4 times daily 04/09/16 0816 04/23/16 0959   04/09/16 0800  ciprofloxacin (CIPRO) IVPB 400 mg  Status:  Discontinued     400 mg 200 mL/hr over 60 Minutes Intravenous Every 12 hours 04/08/16 1833 04/09/16 0849   04/09/16 0200  piperacillin-tazobactam (ZOSYN) IVPB 3.375 g  Status:  Discontinued     3.375 g 12.5 mL/hr over 240 Minutes Intravenous Every 8 hours 04/08/16 1700 04/08/16 1831   04/09/16 0000  metroNIDAZOLE (FLAGYL)  IVPB 500 mg     500 mg 100 mL/hr over 60 Minutes Intravenous Every 8 hours 04/08/16 1822  04/08/16 1845  ciprofloxacin (CIPRO) IVPB 400 mg     400 mg 200 mL/hr over 60 Minutes Intravenous  Once 04/08/16 1833 04/08/16 2154   04/08/16 1700  piperacillin-tazobactam (ZOSYN) IVPB 3.375 g     3.375 g 100 mL/hr over 30 Minutes Intravenous  Once 04/08/16 1655 04/08/16 1744   04/08/16 1700  vancomycin (VANCOCIN) IVPB 1000 mg/200 mL premix  Status:  Discontinued     1,000 mg 200 mL/hr over 60 Minutes Intravenous  Once 04/08/16 1655 04/08/16 1822      Total days of antibiotics: 0          Social History:  reports that he quit smoking about 46 years ago. His smoking use included Cigarettes. He quit after 20.00 years of use. He has never used smokeless tobacco. He reports that he does not drink alcohol or use drugs.  Family History  Problem Relation Age of Onset  . Diabetes Mother   . Heart disease Mother     Heart Disease before age 23  . Hyperlipidemia Mother   . Hypertension Mother   . CVA Mother 56    cause of death  . Cancer Father     stomach/liver  . Hypertension Father     possibly hypertensive  . Cancer Sister     Breast cancer  . CAD Brother   . Heart disease Brother   . Cancer Paternal Uncle     colon  . Heart disease Sister   . Diabetes Sister   . Hypertension Sister   . Heart disease Daughter     Heart Disease before age 81  . Hypertension Daughter   . Heart attack Daughter     General ROS: no change in vision, eating well, no BM today, normal urine, denies neuropathy, +LE pain from "gout" see HPI.   Blood pressure (!) 165/67, pulse 85, temperature 98.7 F (37.1 C), temperature source Oral, resp. rate 16, height 5\' 10"  (1.778 m), weight 79.6 kg (175 lb 7.8 oz), SpO2 100 %. General appearance: alert, cooperative and no distress Eyes: negative findings: pupils equal, round, reactive to light and accomodation Throat: lips, mucosa, and tongue normal; teeth and  gums normal Neck: no adenopathy and supple, symmetrical, trachea midline Lungs: clear to auscultation bilaterally and no tenderness at prev pacer site on R upper chest.  Heart: regular rate and rhythm Abdomen: normal findings: bowel sounds normal and soft, non-tender Extremities: edema 3+ at ankles.  and grossly normal light BLE   Results for orders placed or performed during the hospital encounter of 04/08/16 (from the past 48 hour(s))  Glucose, capillary     Status: Abnormal   Collection Time: 04/11/16  4:43 PM  Result Value Ref Range   Glucose-Capillary 134 (H) 65 - 99 mg/dL  Glucose, capillary     Status: Abnormal   Collection Time: 04/11/16  8:18 PM  Result Value Ref Range   Glucose-Capillary 197 (H) 65 - 99 mg/dL  Glucose, capillary     Status: Abnormal   Collection Time: 04/12/16 12:20 AM  Result Value Ref Range   Glucose-Capillary 127 (H) 65 - 99 mg/dL  Glucose, capillary     Status: None   Collection Time: 04/12/16  4:47 AM  Result Value Ref Range   Glucose-Capillary 73 65 - 99 mg/dL  Glucose, capillary     Status: None   Collection Time: 04/12/16  8:08 AM  Result Value Ref Range   Glucose-Capillary 87 65 - 99 mg/dL  Glucose, capillary  Status: None   Collection Time: 04/12/16 12:36 PM  Result Value Ref Range   Glucose-Capillary 92 65 - 99 mg/dL  Glucose, capillary     Status: Abnormal   Collection Time: 04/12/16  4:41 PM  Result Value Ref Range   Glucose-Capillary 100 (H) 65 - 99 mg/dL  Glucose, capillary     Status: Abnormal   Collection Time: 04/12/16  7:58 PM  Result Value Ref Range   Glucose-Capillary 164 (H) 65 - 99 mg/dL  Glucose, capillary     Status: Abnormal   Collection Time: 04/13/16 12:14 AM  Result Value Ref Range   Glucose-Capillary 131 (H) 65 - 99 mg/dL  Glucose, capillary     Status: None   Collection Time: 04/13/16  4:22 AM  Result Value Ref Range   Glucose-Capillary 82 65 - 99 mg/dL  Glucose, capillary     Status: None   Collection  Time: 04/13/16  7:49 AM  Result Value Ref Range   Glucose-Capillary 97 65 - 99 mg/dL  Uric acid     Status: None   Collection Time: 04/13/16 10:29 AM  Result Value Ref Range   Uric Acid, Serum 6.8 4.4 - 7.6 mg/dL  Glucose, capillary     Status: Abnormal   Collection Time: 04/13/16 11:42 AM  Result Value Ref Range   Glucose-Capillary 141 (H) 65 - 99 mg/dL      Component Value Date/Time   SDES URINE, CLEAN CATCH 04/08/2016 1449   SPECREQUEST NONE 04/08/2016 1449   CULT MULTIPLE SPECIES PRESENT, SUGGEST RECOLLECTION (A) 04/08/2016 1449   REPTSTATUS 04/10/2016 FINAL 04/08/2016 1449   No results found. Recent Results (from the past 240 hour(s))  MRSA PCR Screening     Status: Abnormal   Collection Time: 04/08/16 12:10 AM  Result Value Ref Range Status   MRSA by PCR POSITIVE (A) NEGATIVE Final    Comment:        The GeneXpert MRSA Assay (FDA approved for NASAL specimens only), is one component of a comprehensive MRSA colonization surveillance program. It is not intended to diagnose MRSA infection nor to guide or monitor treatment for MRSA infections. RESULT CALLED TO, READ BACK BY AND VERIFIED WITH: SEARS RN AT 0800 ON 09.29.17 BY SHUEA   Urine culture     Status: Abnormal   Collection Time: 04/08/16  2:49 PM  Result Value Ref Range Status   Specimen Description URINE, CLEAN CATCH  Final   Special Requests NONE  Final   Culture MULTIPLE SPECIES PRESENT, SUGGEST RECOLLECTION (A)  Final   Report Status 04/10/2016 FINAL  Final  C difficile quick scan w PCR reflex     Status: Abnormal   Collection Time: 04/08/16  4:40 PM  Result Value Ref Range Status   C Diff antigen POSITIVE (A) NEGATIVE Final   C Diff toxin POSITIVE (A) NEGATIVE Final   C Diff interpretation Toxin producing C. difficile detected.  Final    Comment: CRITICAL RESULT CALLED TO, READ BACK BY AND VERIFIED WITH: S.SEAGRAVES RN [id number] A.[email or web address]       04/13/2016, 1:39 PM     LOS: 4 days    Records  and images were personally reviewed where available.

## 2016-04-13 NOTE — Progress Notes (Addendum)
Patient ID: [name], male   DOB: 07-22-1935, 80 y.o.   MRN: [id number]  PROGRESS NOTE    Antwonne Saelee  [id number] DOB: 1936-01-23 DOA: 04/08/2016  PCP: [name], MD   Brief Narrative:  80 year old male with past medical history significant for atrial fibrillation on aspirin and amiodarone, coronary artery disease status post CABG, history of C. difficile colitis, dyslipidemia, hypertension, diabetes mellitus who presented to [facility] long ED with abdominal pain, decreased appetite, diarrhea. Patient also had reports of associated shortness of breath. Symptoms started 2 days prior to the admission. Of note, pt was in hospital 7-7 to 7-25 with multifocal pneumonia, MSSA bacteremia and pacemaker endocarditis. He had a negative TEE 7-17. He had his pacer removed on 7-18 and was d/c to SNF on ancef and rifampin with an end date of 8-3.  His repeat BCx on 7-8 were (-).  On admission, patient's blood pressure was as high as 237/97 which then improved to 158/61. Blood work was notable for leukocytosis of 12.3, hemoglobin 11, creatinine 1.59 (baseline creatinine 1.9 about 2 weeks ago). The troponin level was 0.07. The 12-lead EKG showed inverted T waves in lateral leads. Patient did not have reports of chest pain. Chest x-ray showed aortic atherosclerosis with left basilar atelectasis or infiltrate. CT abdomen showed a diffuse acute colitis favoring infectious colitis and possibly C. difficile colitis. Patient also had notable moderate layering left and small layering right pleural effusions. He was started empirically on Cipro and Flagyl. Stool for C. difficile came back positive for which reason vanco PO added and cipro was stopped.  Hospital course complicated with what initially was possible mass on tricuspid valve but now confirmed to be vegetation based on TEE. Infectious disease and cardiothoracic surgery consulted.  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 difficile colitis / Leukocytosis  - Stool for C. difficile positive for C. difficile antigen and toxin - Patient has had previous history of C. 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 (HCC) - Continue sliding scale insulin - CBGs in past 24 hours: 131, 82, 97  Essential hypertension - Continue coreg, imdur, hydralazine, lasix   Dyslipidemia associated with that 2 diabetes mellitus - Continue Lipitor 80 mg at bedtime  Hypokalemia - Due to Lasix - Supplemented  Mild troponin elevation  - Troponin level 0.07, 0.08 - Patient has no reports of chest pain  Chronic kidney disease stage III - Baseline creatinine 1.9 about 2 weeks ago - Creatinine on this admission within baseline values  Anemia of chronic disease - Secondary to CKD - Hemoglobin stable     - Follow up CBC in am   DVT prophylaxis: Heparin subcutaneous Code Status: full code  Family Communication: Family not at the bedside, called pt wife and gave an update on plan of care Disposition Plan: may need to be transferred to [facility] depending on CTS evaluation  Consultants:   ID  CTS  Carsio for TEE  Procedures:   2 D ECHO -  EF 55%, ? Vegetation on TV  TEE 04/12/2016 - Vegetation 1.5 x 1.6 cm on TV  Antimicrobials:   Cipro 04/08/2016 --> 04/09/2016  Flagyl 04/08/2016 -->  Vancomycin by mouth 04/08/2016-->      Subjective: No overnight events  Objective: Vitals:   04/12/16 1639 04/12/16 2003 04/13/16 0425 04/13/16 0642  BP: (!) 176/78 (!) 156/65 (!) 170/67 (!) 165/67  Pulse: 79 72 85 85  Resp: 20 16 16    Temp: 98 F (36.7 C) 98.3 F (36.8 C) 98.7 F (37.1 C)   TempSrc: Oral Oral Oral   SpO2: 100% 97% 100%   Weight:      Height:        Intake/Output Summary (Last 24 hours) at 04/13/16 0908 Last data filed at 04/13/16 0434  Gross per 24 hour  Intake           653.33 ml  Output             1600 ml  Net          -946.67 ml   Filed Weights   04/09/16 0542  Weight: 79.6 kg (175 lb 7.8 oz)    Examination:  General exam: No distress, Respiratory system: No wheezing, no rhonchi Cardiovascular system: S1 & S2 heard, RRR Gastrointestinal system: (+) BS, non tender, non distended  Central nervous system: Nonfocal Extremities: Pulses palpable, no swelling  Skin: warm and dry, no lesions or ulcers  Psychiatry: Normal mood and behavior   Data Reviewed: I have personally reviewed following labs and imaging studies  CBC:  Recent Labs Lab 04/08/16 1503 04/09/16 0530 04/10/16 0527 04/11/16 0528  WBC 12.3* 13.6* 7.2 6.5  HGB 11.0* 9.6* 8.2* 8.8*  HCT 35.3* 30.4* 26.6* 27.8*  MCV 97.0 96.8 94.3 96.2  PLT 182 141* 129* [id number]*   Basic Metabolic Panel:  Recent Labs Lab 04/07/16 04/08/16 1503 04/09/16 0530 04/10/16 0527  NA 145 140 142 138  K 3.6 3.8 3.0* 3.5  CL  --  109 112* 111  CO2  --  24 23 23   GLUCOSE  --  152* 86 80  BUN 21 22* 22* 25*  CREATININE 1.4* 1.59* 1.49* 2.10*  CALCIUM  --  8.8* 8.5* 8.1*   GFR: Estimated Creatinine Clearance: 29.9 mL/min (by C-G formula based on SCr of 2.1 mg/dL (H)). Liver Function Tests:  Recent Labs Lab 04/08/16 1503 04/09/16 0530  AST 24 18  ALT 10* 8*  ALKPHOS 58 47  BILITOT 0.6 0.5  PROT 7.9 6.3*  ALBUMIN 3.4* 2.7*    Recent Labs Lab 04/08/16 1503  LIPASE 31   No results for input(s): AMMONIA in the last 168  hours. Coagulation Profile:  Recent Labs Lab 04/08/16 1503  INR 1.00   Cardiac Enzymes:  Recent Labs Lab 04/08/16 2303 04/09/16 0530 04/09/16 1110  TROPONINI 0.07* 0.08* 0.07*   BNP (last 3 results) No results for input(s): PROBNP in the last 8760 hours. HbA1C: No results for input(s): HGBA1C in the last 72 hours. CBG:  Recent Labs Lab 04/12/16 1641 04/12/16 1958 04/13/16 0014 04/13/16 0422 04/13/16 0749  GLUCAP 100* 164* 131* 82 97   Lipid Profile: No results for input(s): CHOL, HDL, LDLCALC, TRIG, CHOLHDL, LDLDIRECT in the last 72 hours. Thyroid Function Tests: No results for input(s): TSH, T4TOTAL, FREET4, T3FREE, THYROIDAB in the last 72 hours. Anemia Panel: No results for input(s): VITAMINB12,  FOLATE, FERRITIN, TIBC, IRON, RETICCTPCT in the last 72 hours. Urine analysis:    Component Value Date/Time   COLORURINE YELLOW 04/08/2016 1449   APPEARANCEUR CLOUDY (A) 04/08/2016 1449   LABSPEC 1.016 04/08/2016 1449   LABSPEC 1.010 05/19/2015 1231   PHURINE 5.0 04/08/2016 1449   GLUCOSEU NEGATIVE 04/08/2016 1449   GLUCOSEU Negative 05/19/2015 1231   HGBUR SMALL (A) 04/08/2016 1449   BILIRUBINUR NEGATIVE 04/08/2016 1449   BILIRUBINUR Negative 05/19/2015 1231   KETONESUR NEGATIVE 04/08/2016 1449   PROTEINUR 30 (A) 04/08/2016 1449   UROBILINOGEN 0.2 05/19/2015 1231   NITRITE NEGATIVE 04/08/2016 1449   LEUKOCYTESUR LARGE (A) 04/08/2016 1449   LEUKOCYTESUR Negative 05/19/2015 1231   Sepsis Labs: @LABRCNTIP (procalcitonin:4,lacticidven:4)    MRSA PCR Screening     Status: Abnormal   Collection Time: 04/08/16 12:10 AM  Result Value Ref Range Status   MRSA by PCR POSITIVE (A) NEGATIVE Final  C difficile quick scan w PCR reflex     Status: Abnormal   Collection Time: 04/08/16  4:40 PM  Result Value Ref Range Status   C Diff antigen POSITIVE (A) NEGATIVE Final   C Diff toxin POSITIVE (A) NEGATIVE Final   C Diff interpretation Toxin producing C. difficile  detected.  Final      Radiology Studies: Dg Chest 2 View Result Date: 04/08/2016 Aortic atherosclerosis. Mild left basilar atelectasis or infiltrate is noted with mild left pleural effusion.   Ct Abdomen Pelvis W Contrast Result Date: 9/28/20171. Diffuse acute colitis. Favor infectious colitis and consider C. difficile colitis. 2. Superimposed anasarca. Moderate layering left and small layering right pleural effusions. Associated lung base atelectasis but difficult to exclude developing pneumonia in the lingula and at the right lung base. 3. Chronic severe diffuse calcified atherosclerosis throughout the abdomen and pelvis. No definite large vessel occlusion. 4. Small fluid containing right femoral hernia suspected and new since February.    Scheduled Meds: . amiodarone  100 mg Oral Daily  . aspirin EC  81 mg Oral Daily  . atorvastatin  80 mg Oral QHS  . calcium-vitamin D  1 tablet Oral Q breakfast  . carvedilol  12.5 mg Oral BID WC  . Chlorhexidine Gluconate Cloth  6 each Topical Q0600  . ferrous sulfate  325 mg Oral TID WC  . furosemide  40 mg Oral Daily  . heparin  5,000 Units Subcutaneous Q8H  . hydrALAZINE  75 mg Oral QID  . insulin aspart  0-15 Units Subcutaneous Q4H  . isosorbide mononitrate  120 mg Oral Daily  . metronidazole  500 mg Intravenous Q8H  . mupirocin ointment  1 application Nasal BID  . sodium chloride flush  3 mL Intravenous Q12H  . tamsulosin  0.4 mg Oral QHS  . vancomycin  125 mg Oral QID   Continuous Infusions: . sodium chloride       LOS: 4 days    Time spent: 25 minutes  Greater than 50% of the time spent on counseling and coordinating the care.   Leisa Lenz, MD [facility] Pager [phone]  If 7PM-7AM, please contact night-coverage [email or web address] Password [email or web address] 04/13/2016, 9:08 AM

## 2016-04-13 NOTE — Progress Notes (Signed)
Pt refuses to go to [facility] He does not want any other intervention other than abx. He wants to go home but we need 3rd blood culture tomorrow am. If he were to leave it would ave to be ama.  Leisa Lenz [facility] [phone]

## 2016-04-14 DIAGNOSIS — I368 Other nonrheumatic tricuspid valve disorders: Secondary | ICD-10-CM

## 2016-04-14 DIAGNOSIS — A0472 Enterocolitis due to Clostridium difficile, not specified as recurrent: Secondary | ICD-10-CM

## 2016-04-14 LAB — CBC
HCT: 29.4 % — ABNORMAL LOW (ref 39.0–52.0)
Hemoglobin: 9.4 g/dL — ABNORMAL LOW (ref 13.0–17.0)
MCH: 30.5 pg (ref 26.0–34.0)
MCHC: 32 g/dL (ref 30.0–36.0)
MCV: 95.5 fL (ref 78.0–100.0)
PLATELETS: 152 10*3/uL (ref 150–400)
RBC: 3.08 MIL/uL — AB (ref 4.22–5.81)
RDW: 20.9 % — AB (ref 11.5–15.5)
WBC: 6 10*3/uL (ref 4.0–10.5)

## 2016-04-14 LAB — BASIC METABOLIC PANEL
Anion gap: 7 (ref 5–15)
BUN: 32 mg/dL — AB (ref 6–20)
CO2: 23 mmol/L (ref 22–32)
Calcium: 8.7 mg/dL — ABNORMAL LOW (ref 8.9–10.3)
Chloride: 108 mmol/L (ref 101–111)
Creatinine, Ser: 1.85 mg/dL — ABNORMAL HIGH (ref 0.61–1.24)
GFR calc Af Amer: 39 mL/min — ABNORMAL LOW (ref >60)
GFR, EST NON AFRICAN AMERICAN: 33 mL/min — AB (ref >60)
GLUCOSE: 99 mg/dL (ref 65–99)
POTASSIUM: 3.5 mmol/L (ref 3.5–5.1)
Sodium: 138 mmol/L (ref 135–145)

## 2016-04-14 LAB — GLUCOSE, CAPILLARY
GLUCOSE-CAPILLARY: 149 mg/dL — AB (ref 65–99)
Glucose-Capillary: 106 mg/dL — ABNORMAL HIGH (ref 65–99)
Glucose-Capillary: 161 mg/dL — ABNORMAL HIGH (ref 65–99)
Glucose-Capillary: 181 mg/dL — ABNORMAL HIGH (ref 65–99)
Glucose-Capillary: 92 mg/dL (ref 65–99)
Glucose-Capillary: 99 mg/dL (ref 65–99)

## 2016-04-14 MED ORDER — CEFAZOLIN SODIUM-DEXTROSE 2-4 GM/100ML-% IV SOLN
2.0000 g | Freq: Three times a day (TID) | INTRAVENOUS | Status: DC
  Administered 2016-04-14: 2 g via INTRAVENOUS
  Filled 2016-04-14 (×2): qty 100

## 2016-04-14 MED ORDER — PREDNISONE 20 MG PO TABS
40.0000 mg | ORAL_TABLET | Freq: Every day | ORAL | Status: DC
Start: 2016-04-15 — End: 2016-04-16
  Administered 2016-04-15 – 2016-04-16 (×2): 40 mg via ORAL
  Filled 2016-04-14 (×2): qty 2

## 2016-04-14 MED ORDER — CEFAZOLIN IN D5W 1 GM/50ML IV SOLN
1.0000 g | Freq: Two times a day (BID) | INTRAVENOUS | Status: DC
  Administered 2016-04-15 – 2016-04-16 (×4): 1 g via INTRAVENOUS
  Filled 2016-04-14 (×4): qty 50

## 2016-04-14 NOTE — Progress Notes (Signed)
Pt plan to discharge home. Will continue to follow for discharge needs.  

## 2016-04-14 NOTE — Progress Notes (Signed)
Patient ID: [name]., male   DOB: [birth year], 80 y.o.   MRN: [id number] CT surgery. I have reviewed all of his information and I will see him later today. He is not a candidate for surgical treatment for multiple reasons and hopefully this can be resolved with antibiotics. I think it is fine for him to stay at [facility].  Thanks

## 2016-04-14 NOTE — Progress Notes (Signed)
INFECTIOUS DISEASE PROGRESS NOTE  ID: [name]. is a 80 y.o. male with  Active Problems:   PAF (paroxysmal atrial fibrillation) (HCC)   Chronic diastolic heart failure (HCC)   Diabetes mellitus ([place])   Essential hypertension   HCAP (healthcare-associated pneumonia)   Colitis   C. difficile colitis  Subjective: Without complaints.  No BM today.   Abtx:  Anti-infectives    Start     Dose/Rate Route Frequency Ordered Stop   04/14/16 1100  ceFAZolin (ANCEF) IVPB 2g/100 mL premix     2 g 200 mL/hr over 30 Minutes Intravenous Every 8 hours 04/14/16 1036     04/10/16 0000  metroNIDAZOLE (FLAGYL) 500 MG tablet     500 mg Oral 3 times daily 04/10/16 1407     04/10/16 0000  vancomycin (VANCOCIN) 50 mg/mL oral solution     125 mg Oral 4 times daily 04/10/16 1407     04/09/16 1800  vancomycin (VANCOCIN) IVPB 1000 mg/200 mL premix  Status:  Discontinued     1,000 mg 200 mL/hr over 60 Minutes Intravenous Every 24 hours 04/08/16 1700 04/08/16 1822   04/09/16 1000  vancomycin (VANCOCIN) 50 mg/mL oral solution 125 mg     125 mg Oral 4 times daily 04/09/16 0816 04/23/16 0959   04/09/16 0800  ciprofloxacin (CIPRO) IVPB 400 mg  Status:  Discontinued     400 mg 200 mL/hr over 60 Minutes Intravenous Every 12 hours 04/08/16 1833 04/09/16 0849   04/09/16 0200  piperacillin-tazobactam (ZOSYN) IVPB 3.375 g  Status:  Discontinued     3.375 g 12.5 mL/hr over 240 Minutes Intravenous Every 8 hours 04/08/16 1700 04/08/16 1831   04/09/16 0000  metroNIDAZOLE (FLAGYL) IVPB 500 mg  Status:  Discontinued     500 mg 100 mL/hr over 60 Minutes Intravenous Every 8 hours 04/08/16 1822 04/13/16 1421   04/08/16 1845  ciprofloxacin (CIPRO) IVPB 400 mg     400 mg 200 mL/hr over 60 Minutes Intravenous  Once 04/08/16 1833 04/08/16 2154   04/08/16 1700  piperacillin-tazobactam (ZOSYN) IVPB 3.375 g     3.375 g 100 mL/hr over 30 Minutes Intravenous  Once 04/08/16 1655 04/08/16 1744   04/08/16 1700   vancomycin (VANCOCIN) IVPB 1000 mg/200 mL premix  Status:  Discontinued     1,000 mg 200 mL/hr over 60 Minutes Intravenous  Once 04/08/16 1655 04/08/16 1822      Medications:  Scheduled: . amiodarone  100 mg Oral Daily  . aspirin EC  81 mg Oral Daily  . atorvastatin  80 mg Oral QHS  . calcium-vitamin D  1 tablet Oral Q breakfast  . carvedilol  12.5 mg Oral BID WC  .  ceFAZolin (ANCEF) IV  2 g Intravenous Q8H  . Chlorhexidine Gluconate Cloth  6 each Topical Q0600  . ferrous sulfate  325 mg Oral TID WC  . furosemide  40 mg Oral Daily  . heparin  5,000 Units Subcutaneous Q8H  . hydrALAZINE  75 mg Oral QID  . insulin aspart  0-15 Units Subcutaneous Q4H  . isosorbide mononitrate  120 mg Oral Daily  . mupirocin ointment  1 application Nasal BID  . [START ON 04/15/2016] predniSONE  40 mg Oral Q breakfast  . sodium chloride flush  3 mL Intravenous Q12H  . tamsulosin  0.4 mg Oral QHS  . vancomycin  125 mg Oral QID    Objective: Vital signs in last 24 hours: Temp:  [98.4 F (36.9 C)-98.8  F (37.1 C)] 98.8 F (37.1 C) (10/04 0651) Pulse Rate:  [72-80] 80 (10/04 0651) Resp:  [16-18] 18 (10/04 0651) BP: (149-180)/(56-74) 180/74 (10/04 0651) SpO2:  [100 %] 100 % (10/04 0651)   General appearance: alert, cooperative and no distress Resp: clear to auscultation bilaterally Cardio: regular rate and rhythm GI: normal findings: bowel sounds normal and soft, non-tender  Lab Results  Recent Labs  04/14/16 0538  WBC 6.0  HGB 9.4*  HCT 29.4*  NA 138  K 3.5  CL 108  CO2 23  BUN 32*  CREATININE 1.85*   Liver Panel No results for input(s): PROT, ALBUMIN, AST, ALT, ALKPHOS, BILITOT, BILIDIR, IBILI in the last 72 hours. Sedimentation Rate No results for input(s): ESRSEDRATE in the last 72 hours. C-Reactive Protein No results for input(s): CRP in the last 72 hours.  Microbiology: Recent Results (from the past 240 hour(s))  MRSA PCR Screening     Status: Abnormal   Collection  Time: 04/08/16 12:10 AM  Result Value Ref Range Status   MRSA by PCR POSITIVE (A) NEGATIVE Final    Comment:        The GeneXpert MRSA Assay (FDA approved for NASAL specimens only), is one component of a comprehensive MRSA colonization surveillance program. It is not intended to diagnose MRSA infection nor to guide or monitor treatment for MRSA infections. RESULT CALLED TO, READ BACK BY AND VERIFIED WITH: SEARS RN AT 0800 ON 09.29.17 BY SHUEA   Urine culture     Status: Abnormal   Collection Time: 04/08/16  2:49 PM  Result Value Ref Range Status   Specimen Description URINE, CLEAN CATCH  Final   Special Requests NONE  Final   Culture MULTIPLE SPECIES PRESENT, SUGGEST RECOLLECTION (A)  Final   Report Status 04/10/2016 FINAL  Final  C difficile quick scan w PCR reflex     Status: Abnormal   Collection Time: 04/08/16  4:40 PM  Result Value Ref Range Status   C Diff antigen POSITIVE (A) NEGATIVE Final   C Diff toxin POSITIVE (A) NEGATIVE Final   C Diff interpretation Toxin producing C. difficile detected.  Final    Comment: CRITICAL RESULT CALLED TO, READ BACK BY AND VERIFIED WITH: S.SEAGRAVES RN [id number] [email or web address]     Studies/Results: No results found.   Assessment/Plan: Endocarditis, TV Await his BCx (times 3) restart ancef I am very suspicous he has endocarditis from his prev bacteremia or from his recent hospitalization. Prior endocarditis is a risk factor for repeat endocarditis.  He denies remaining hardware.  Appreciate CVTS eval   C diff Would give him 14 days of po vanco  Total days of antibiotics: 0 Ancef, 5 po vanco         Bobby Rumpf Infectious Diseases (pager) [phone] [email or web address] 04/14/2016, 2:50 PM  LOS: 5 days

## 2016-04-14 NOTE — Progress Notes (Signed)
PROGRESS NOTE                                                                                                                                                                                                             Patient Demographics:    [name], is a 80 y.o. male, DOB - [birth year], TX:[id number]  Admit date - 04/08/2016   Admitting Physician Donne Hazel, MD  Outpatient Primary MD for the patient is [name], MD  LOS - 5  Outpatient Specialists: Cardiology  Chief Complaint  Patient presents with  . Abdominal Pain       Brief Narrative   80 year old male with history of A. fib on aspirin and amiodarone, CAD status post CABG, history of C. 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 Patient presented to the ED with abdominal pain, poor appetite and diarrhea with associated shortness of breath since 2 days prior to admission. Patient had hypertensive urgency on presentation (237/97 mmHg), mild leukocytosis (12.3) and mildly elevated troponin (0.07) EKG showed inverted T waves in lateral leads. Denied chest pain symptoms. CT abdomen showed diffuse acute colitis favoring infection. Patient started on empiric Cipro and Flagyl. Stool for C. difficile was positive and she is to oral vancomycin. 2-D echo done given mild elevated troponin showed a possible tricuspid vegetation. This was followed by a TEE which showed a tricuspid vegetation.    Subjective:   Patient denies any chest pain or shortness of breath. He refused to go to [facility] for further workup.   Assessment  & Plan :     Principal problem Infective endocarditis with tricuspid valve  vegetation Confirmed on TEE. 3 sets of blood cultures sent, no growth so far. Resumed Ancef. Appreciate ID consult. Suspected endocarditis from prior bacteremia/endocarditis. Cardiac thoracic surgery consulted who reviewed patient's information and recommend that he is not a candidate for surgical treatment given his multiple comorbidities. Recommend to continue antibiotics for now.  Active Problems: Acute C. difficile colitis Abdominal pain and diarrhea resolved. Oral vancomycin for 2 weeks course.     PAF (paroxysmal atrial fibrillation) ([place]) CHADS2vasc of 6. On anticoagulation with aspirin. Continue Coreg  and amiodarone.    Chronic diastolic heart failure (HCC) Has trace leg edema. Continue Lasix.  Acute gouty arthritis of left ankle Or start him on oral prednisone for 5 day course.    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     Essential hypertension Continue Coreg, Imdur, hydralazine and Lasix  CK D stage III Renal function at baseline.  Anemia of chronic kidney disease Stable.   Hypokalemia Replenished    Code Status : Full code  Family Communication  : None at bedside  Disposition Plan  : Home pending blood culture results, needs PICC line for long-term antibiotics.  Barriers For Discharge : Pending blood culture results  Consults  :  ID Cardiac thoracic surgery  Procedures  :  CT abdomen 2-D echo TEE  DVT Prophylaxis  :  - Heparin   Lab Results  Component Value Date   PLT 152 04/14/2016    Antibiotics  :   Anti-infectives    Start     Dose/Rate Route Frequency Ordered Stop   04/15/16 0000  ceFAZolin (ANCEF) IVPB 1 g/50 mL premix     1 g 100 mL/hr over 30 Minutes Intravenous Every 12 hours 04/14/16 1502     04/14/16 1100  ceFAZolin (ANCEF) IVPB 2g/100 mL premix  Status:  Discontinued     2 g 200 mL/hr over 30 Minutes Intravenous Every 8 hours 04/14/16 1036 04/14/16  1500   04/10/16 0000  metroNIDAZOLE (FLAGYL) 500 MG tablet     500 mg Oral 3 times daily 04/10/16 1407     04/10/16 0000  vancomycin (VANCOCIN) 50 mg/mL oral solution     125 mg Oral 4 times daily 04/10/16 1407     04/09/16 1800  vancomycin (VANCOCIN) IVPB 1000 mg/200 mL premix  Status:  Discontinued     1,000 mg 200 mL/hr over 60 Minutes Intravenous Every 24 hours 04/08/16 1700 04/08/16 1822   04/09/16 1000  vancomycin (VANCOCIN) 50 mg/mL oral solution 125 mg     125 mg Oral 4 times daily 04/09/16 0816 04/23/16 0959   04/09/16 0800  ciprofloxacin (CIPRO) IVPB 400 mg  Status:  Discontinued     400 mg 200 mL/hr over 60 Minutes Intravenous Every 12 hours 04/08/16 1833 04/09/16 0849   04/09/16 0200  piperacillin-tazobactam (ZOSYN) IVPB 3.375 g  Status:  Discontinued     3.375 g 12.5 mL/hr over 240 Minutes Intravenous Every 8 hours 04/08/16 1700 04/08/16 1831   04/09/16 0000  metroNIDAZOLE (FLAGYL) IVPB 500 mg  Status:  Discontinued     500 mg 100 mL/hr over 60 Minutes Intravenous Every 8 hours 04/08/16 1822 04/13/16 1421   04/08/16 1845  ciprofloxacin (CIPRO) IVPB 400 mg     400 mg 200 mL/hr over 60 Minutes Intravenous  Once 04/08/16 1833 04/08/16 2154   04/08/16 1700  piperacillin-tazobactam (ZOSYN) IVPB 3.375 g     3.375 g 100 mL/hr over 30 Minutes Intravenous  Once 04/08/16 1655 04/08/16 1744   04/08/16 1700  vancomycin (VANCOCIN) IVPB 1000 mg/200 mL premix  Status:  Discontinued     1,000 mg 200 mL/hr over 60 Minutes Intravenous  Once 04/08/16 1655 04/08/16 1822        Objective:   Vitals:   04/13/16 0642 04/13/16 1347 04/13/16 2015 04/14/16 0651  BP: (!) 165/67 (!) 139/59 (!) 149/56 (!) 180/74  Pulse: 85 77 72 80  Resp:  18 16 18   Temp:  98.7  F (37.1 C) 98.4 F (36.9 C) 98.8 F (37.1 C)  TempSrc:  Oral Oral Oral  SpO2:  100% 100% 100%  Weight:      Height:        Wt Readings from Last 3 Encounters:  04/09/16 79.6 kg (175 lb 7.8 oz)  04/08/16 79.8 kg (176 lb)   03/31/16 79.8 kg (176 lb)     Intake/Output Summary (Last 24 hours) at 04/14/16 1546 Last data filed at 04/14/16 1417  Gross per 24 hour  Intake              220 ml  Output              810 ml  Net             -590 ml     Physical Exam  Gen: not in distress HEENT:moist mucosa, supple neck Chest: clear b/l, no added sounds CVS: N S1&S2, no murmurs, rubs or gallop GI: soft, NT, ND, BS+ Musculoskeletal: warm, trace edema bilaterally, left ankle tenderness CNS: Alert and oriented, nonfocal    Data Review:    CBC  Recent Labs Lab 04/08/16 1503 04/09/16 0530 04/10/16 0527 04/11/16 0528 04/14/16 0538  WBC 12.3* 13.6* 7.2 6.5 6.0  HGB 11.0* 9.6* 8.2* 8.8* 9.4*  HCT 35.3* 30.4* 26.6* 27.8* 29.4*  PLT 182 141* 129* 132* 152  MCV 97.0 96.8 94.3 96.2 95.5  MCH 30.2 30.6 29.1 30.4 30.5  MCHC 31.2 31.6 30.8 31.7 32.0  RDW 23.6* 23.3* 22.2* 22.0* 20.9*    Chemistries   Recent Labs Lab 04/08/16 1503 04/09/16 0530 04/10/16 0527 04/14/16 0538  NA 140 142 138 138  K 3.8 3.0* 3.5 3.5  CL 109 112* 111 108  CO2 24 23 23 23   GLUCOSE 152* 86 80 99  BUN 22* 22* 25* 32*  CREATININE 1.59* 1.49* 2.10* 1.85*  CALCIUM 8.8* 8.5* 8.1* 8.7*  AST 24 18  --   --   ALT 10* 8*  --   --   ALKPHOS 58 47  --   --   BILITOT 0.6 0.5  --   --    ------------------------------------------------------------------------------------------------------------------ No results for input(s): CHOL, HDL, LDLCALC, TRIG, CHOLHDL, LDLDIRECT in the last 72 hours.  Lab Results  Component Value Date   HGBA1C 4.9 02/20/2016   ------------------------------------------------------------------------------------------------------------------ No results for input(s): TSH, T4TOTAL, T3FREE, THYROIDAB in the last 72 hours.  Invalid input(s): FREET3 ------------------------------------------------------------------------------------------------------------------ No results for input(s): VITAMINB12,  FOLATE, FERRITIN, TIBC, IRON, RETICCTPCT in the last 72 hours.  Coagulation profile  Recent Labs Lab 04/08/16 1503  INR 1.00    No results for input(s): DDIMER in the last 72 hours.  Cardiac Enzymes  Recent Labs Lab 04/08/16 2303 04/09/16 0530 04/09/16 1110  TROPONINI 0.07* 0.08* 0.07*   ------------------------------------------------------------------------------------------------------------------ No results found for: BNP  Inpatient Medications  Scheduled Meds: . amiodarone  100 mg Oral Daily  . aspirin EC  81 mg Oral Daily  . atorvastatin  80 mg Oral QHS  . calcium-vitamin D  1 tablet Oral Q breakfast  . carvedilol  12.5 mg Oral BID WC  . [START ON 04/15/2016]  ceFAZolin (ANCEF) IV  1 g Intravenous Q12H  . Chlorhexidine Gluconate Cloth  6 each Topical Q0600  . ferrous sulfate  325 mg Oral TID WC  . furosemide  40 mg Oral Daily  . heparin  5,000 Units Subcutaneous Q8H  . hydrALAZINE  75 mg Oral QID  . insulin aspart  0-15  Units Subcutaneous Q4H  . isosorbide mononitrate  120 mg Oral Daily  . mupirocin ointment  1 application Nasal BID  . [START ON 04/15/2016] predniSONE  40 mg Oral Q breakfast  . sodium chloride flush  3 mL Intravenous Q12H  . tamsulosin  0.4 mg Oral QHS  . vancomycin  125 mg Oral QID   Continuous Infusions:  PRN Meds:.acetaminophen **OR** acetaminophen, hydrALAZINE, HYDROcodone-acetaminophen, ondansetron **OR** ondansetron (ZOFRAN) IV  Micro Results Recent Results (from the past 240 hour(s))  MRSA PCR Screening     Status: Abnormal   Collection Time: 04/08/16 12:10 AM  Result Value Ref Range Status   MRSA by PCR POSITIVE (A) NEGATIVE Final    Comment:        The GeneXpert MRSA Assay (FDA approved for NASAL specimens only), is one component of a comprehensive MRSA colonization surveillance program. It is not intended to diagnose MRSA infection nor to guide or monitor treatment for MRSA infections. RESULT CALLED TO, READ BACK BY AND  VERIFIED WITH: SEARS RN AT 0800 ON 09.29.17 BY SHUEA   Urine culture     Status: Abnormal   Collection Time: 04/08/16  2:49 PM  Result Value Ref Range Status   Specimen Description URINE, CLEAN CATCH  Final   Special Requests NONE  Final   Culture MULTIPLE SPECIES PRESENT, SUGGEST RECOLLECTION (A)  Final   Report Status 04/10/2016 FINAL  Final  C difficile quick scan w PCR reflex     Status: Abnormal   Collection Time: 04/08/16  4:40 PM  Result Value Ref Range Status   C Diff antigen POSITIVE (A) NEGATIVE Final   C Diff toxin POSITIVE (A) NEGATIVE Final   C Diff interpretation Toxin producing C. difficile detected.  Final    Comment: CRITICAL RESULT CALLED TO, READ BACK BY AND VERIFIED WITH: S.SEAGRAVES RN [id number]   Culture, blood (single)     Status: None (Preliminary result)   Collection Time: 04/13/16  9:37 AM  Result Value Ref Range Status   Specimen Description BLOOD RIGHT ARM  Final   Special Requests BOTTLES DRAWN AEROBIC AND ANAEROBIC 5.5CC  Final   Culture   Final    NO GROWTH 1 DAY Performed at [facility]    Report Status PENDING  Incomplete  Culture, blood (single)     Status: None (Preliminary result)   Collection Time: 04/13/16  2:30 PM  Result Value Ref Range Status   Specimen Description BLOOD RIGHT ARM  Final   Special Requests BOTTLES DRAWN AEROBIC AND ANAEROBIC 5CC  Final   Culture   Final    NO GROWTH < 24 HOURS Performed at [facility]    Report Status PENDING  Incomplete    Radiology Reports Dg Chest 2 View  Result Date: 04/08/2016 CLINICAL DATA:  Epigastric abdominal pain. EXAM: CHEST  2 VIEW COMPARISON:  Radiograph of February 20, 2016. FINDINGS: Stable cardiomediastinal silhouette. Atherosclerosis of thoracic aorta is noted. Sternotomy wires are noted. No pneumothorax is noted. Minimal right basilar subsegmental atelectasis is noted. Mild left basilar atelectasis or infiltrate is noted with mild left pleural effusion.  Bony thorax is unremarkable. IMPRESSION: Aortic atherosclerosis. Mild left basilar atelectasis or infiltrate is noted with mild left pleural effusion. Electronically Signed   By: Marijo Conception, M.D.   On: 04/08/2016 15:24   Ct Abdomen Pelvis W Contrast  Result Date: 04/08/2016 CLINICAL DATA:  80 year old male with prolonged hospitalization and rehab this summer. Acute abdominal pain today. Initial  encounter. EXAM: CT ABDOMEN AND PELVIS WITH CONTRAST TECHNIQUE: Multidetector CT imaging of the abdomen and pelvis was performed using the standard protocol following bolus administration of intravenous contrast. CONTRAST:  166mL ISOVUE-300 IOPAMIDOL (ISOVUE-300) INJECTION 61% COMPARISON:  Noncontrast CT Abdomen and Pelvis 08/30/2015 and earlier. CTA abdomen and pelvis 05/18/2012. FINDINGS: Lower chest: Small to moderate layering left and small layering right pleural effusions are new since February. Associated compressive atelectasis at both lung bases. There are some air bronchograms in the right lower lobe. There is also mild consolidation in the lingula with some air bronchograms. Cardiomegaly. No pericardial effusion. Extensive coronary artery calcified atherosclerosis status post CABG. No upper abdominal free air. Hepatobiliary: Stable liver with multiple benign-appearing low-density areas. Chronic absence of the gallbladder. Intra and extrahepatic biliary ducts appear stable. Pancreas: Somewhat atrophied and stable. Spleen: Negative. Adrenals/Urinary Tract: Negative adrenal glands. A mild degree of bilateral renal atrophy is noted. Renal contrast enhancement and excretion is within normal limits. Diminutive urinary bladder which may explain the appearance of bladder wall thickening. Stomach/Bowel: Diffusely abnormal large bowel wall thickening. Much of the inflamed colon is also decompressed. Mucosa seems to be hyperenhancing throughout much of the affected segments. Involvement from the cecum to the rectum is  noted. Numerous fluid-filled small bowel loops, but no definite dilated small bowel. Decompressed stomach and duodenum. Superimposed diffuse mesenteric haziness suggesting mesenteric edema, but this may be secondary to anasarca described below. Vascular/Lymphatic: Severe diffuse calcified atherosclerosis of the aorta, all of its major branches, the pelvic arteries and proximal femoral arteries. No definite major arterial branch occlusion is identified. No definite lymphadenopathy. Reproductive: Negative. Other: Diffuse subcutaneous fat stranding in keeping with anasarca. Small volume of pelvic free fluid about the rectum.7 there appears to be a a fluid containing right femoral hernia which is new (series 3, image 87). Musculoskeletal: No acute osseous abnormality identified. IMPRESSION: 1. Diffuse acute colitis. Favor infectious colitis and consider C. difficile colitis. 2. Superimposed anasarca. Moderate layering left and small layering right pleural effusions. Associated lung base atelectasis but difficult to exclude developing pneumonia in the lingula and at the right lung base. 3. Chronic severe diffuse calcified atherosclerosis throughout the abdomen and pelvis. No definite large vessel occlusion. 4. Small fluid containing right femoral hernia suspected and new since February. Electronically Signed   By: Genevie Ann M.D.   On: 04/08/2016 16:00   Ir Removal Tun Cv Cath W/o Fl  Result Date: 03/31/2016 CLINICAL DATA:  Tunneled IJ PICC placed by IR 02/02/2016 for antibiotic treatment. Worked well, without complication. Removal requested. EXAM: TUNNELED CENTRAL VENOUS CATHETER REMOVAL TECHNIQUE: Overlying skin prepped with chlorhexidine, draped in usual sterile fashion. The previously placed tunneled left IJ central venous catheter was dissected free from the underlying soft tissues and removed intact. Hemostasis was achieved. Site covered with a sterile dressing. The patient tolerated the procedure well.  COMPLICATIONS: none IMPRESSION: 1. Technically successful tunneled left IJ central venous catheter removal. Electronically Signed   By: Lucrezia Europe M.D.   On: 03/31/2016 11:08    Time Spent in minutes  25   Louellen Molder M.D on 04/14/2016 at 3:46 PM  Between 7am to 7pm - Pager - [phone]  After 7pm go to [email or web address] - password [facility]  [facility] -  Office  [phone]

## 2016-04-15 DIAGNOSIS — I368 Other nonrheumatic tricuspid valve disorders: Secondary | ICD-10-CM

## 2016-04-15 DIAGNOSIS — I079 Rheumatic tricuspid valve disease, unspecified: Secondary | ICD-10-CM | POA: Diagnosis present

## 2016-04-15 DIAGNOSIS — I39 Endocarditis and heart valve disorders in diseases classified elsewhere: Secondary | ICD-10-CM

## 2016-04-15 DIAGNOSIS — M109 Gout, unspecified: Secondary | ICD-10-CM

## 2016-04-15 DIAGNOSIS — I371 Nonrheumatic pulmonary valve insufficiency: Secondary | ICD-10-CM

## 2016-04-15 LAB — BASIC METABOLIC PANEL
Anion gap: 4 — ABNORMAL LOW (ref 5–15)
BUN: 30 mg/dL — ABNORMAL HIGH (ref 6–20)
CHLORIDE: 109 mmol/L (ref 101–111)
CO2: 23 mmol/L (ref 22–32)
CREATININE: 1.79 mg/dL — AB (ref 0.61–1.24)
Calcium: 8.5 mg/dL — ABNORMAL LOW (ref 8.9–10.3)
GFR calc Af Amer: 40 mL/min — ABNORMAL LOW (ref >60)
GFR calc non Af Amer: 35 mL/min — ABNORMAL LOW (ref >60)
GLUCOSE: 107 mg/dL — AB (ref 65–99)
POTASSIUM: 3.5 mmol/L (ref 3.5–5.1)
SODIUM: 136 mmol/L (ref 135–145)

## 2016-04-15 LAB — CBC
HEMATOCRIT: 28.2 % — AB (ref 39.0–52.0)
HEMOGLOBIN: 9.1 g/dL — AB (ref 13.0–17.0)
MCH: 30.6 pg (ref 26.0–34.0)
MCHC: 32.3 g/dL (ref 30.0–36.0)
MCV: 94.9 fL (ref 78.0–100.0)
Platelets: 162 10*3/uL (ref 150–400)
RBC: 2.97 MIL/uL — AB (ref 4.22–5.81)
RDW: 20.6 % — ABNORMAL HIGH (ref 11.5–15.5)
WBC: 6.8 10*3/uL (ref 4.0–10.5)

## 2016-04-15 LAB — GLUCOSE, CAPILLARY
GLUCOSE-CAPILLARY: 112 mg/dL — AB (ref 65–99)
GLUCOSE-CAPILLARY: 131 mg/dL — AB (ref 65–99)
Glucose-Capillary: 119 mg/dL — ABNORMAL HIGH (ref 65–99)
Glucose-Capillary: 128 mg/dL — ABNORMAL HIGH (ref 65–99)
Glucose-Capillary: 148 mg/dL — ABNORMAL HIGH (ref 65–99)

## 2016-04-15 MED ORDER — HYDRALAZINE HCL 50 MG PO TABS
100.0000 mg | ORAL_TABLET | Freq: Three times a day (TID) | ORAL | Status: DC
Start: 2016-04-15 — End: 2016-04-16
  Administered 2016-04-15 – 2016-04-16 (×3): 100 mg via ORAL
  Filled 2016-04-15 (×3): qty 2

## 2016-04-15 MED ORDER — CARVEDILOL 25 MG PO TABS
25.0000 mg | ORAL_TABLET | Freq: Two times a day (BID) | ORAL | Status: DC
  Administered 2016-04-15 – 2016-04-16 (×2): 25 mg via ORAL
  Filled 2016-04-15 (×2): qty 1

## 2016-04-15 NOTE — Consult Note (Signed)
[address]       [place] [address]             [phone]      Cardiothoracic Surgery Consultation  Reason for Consult: tricuspid valve endocarditis Referring Physician: Dr. Velta Addison Sr. is an 80 y.o. male.  HPI:   The patient is a 80 year old gentleman with a history of CAD s/p CABG in 1994 and redo in 2013 by me, chronic diastolic heart failure, CKD, type 2 DM, hypertension and hyperlipidemia, atrial fibrillation, myelodysplastic syndrome and sick sinus syndrome who had an infected pacemaker removed in July 2017 after he presented with pneumonia and MSSA bacteremia. He was treated with antibiotics and subsequently developed C. Diff colitis. He was readmitted on 9/28 with worsening abdominal pain and SOB and a CT showed acute colitis with C. Diff positive for which he is being treated. He had marked peripheral edema ad an echo and TEE showed probable TV vegetation on the posterior leaflet. Repeat BC negative so far.   Past Medical History:  Diagnosis Date  . Angiomyolipoma 2009   On both kidneys noted in 2009  . Arthritis    neck and left wrist  . Atrial fibrillation ([place])   . CAD (coronary artery disease)    s/b CABG 1994, and subsequent stents. Repeat CABG 12/2011,  . Chronic diastolic heart failure ([place])   . CKD (chronic kidney disease)   . Clostridium difficile diarrhea 03/2016  . Depressive disorder   . Diabetes mellitus type 2 with peripheral artery disease (HCC)    DIET CONTROLLED  . DVT (deep venous thrombosis) ([place]) 2011   Right arm  . Gastroparesis   . GERD (gastroesophageal reflux disease)   . Gout   . History of hiatal hernia   . Hyperlipidemia   . Hypertension   . Internal hemorrhoids without mention of complication   . Ischemic colitis ([place])   . 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 Myelodysplastic syndrome ([place]) 05/22/2013   With low hemoglobin and platelets treated with Procrit  . Osteopenia   . Peptic ulcer    S/p  partial gastrectomy in 1969  . Peripheral artery disease ([place])   . Pneumonia 01/16/2016  . Pneumonia 03/2016  . Presence of permanent cardiac pacemaker   . Prostate cancer ([place]) 1997   XRT and lupron  . Renal artery stenosis ([place])   . Sick sinus syndrome ([place])   . Vitamin B 12 deficiency     Past Surgical History:  Procedure Laterality Date  . ABDOMINAL ANGIOGRAM N/A 05/25/2011   Procedure: ABDOMINAL ANGIOGRAM;  Surgeon: Serafina Mitchell, MD;  Location: [facility] CATH LAB;  Service: Cardiovascular;  Laterality: N/A;  . ABDOMINAL ANGIOGRAM N/A 02/13/2013   Procedure: ABDOMINAL ANGIOGRAM;  Surgeon: Serafina Mitchell, MD;  Location: [facility] CATH LAB;  Service: Cardiovascular;  Laterality: N/A;  . APPENDECTOMY  1991  . CELIAC ARTERY ANGIOPLASTY  05-16-12   and stenting  . CHOLECYSTECTOMY  Oct 2009   Laparoscopic  . CORONARY ARTERY BYPASS GRAFT  01/22/1993  . CORONARY ARTERY BYPASS GRAFT  01/03/2012   Procedure: REDO CORONARY ARTERY BYPASS GRAFTING (CABG);  Surgeon: Gaye Pollack, MD;  Location: [place];  Service: Open Heart Surgery;  Laterality: N/A;  Redo CABG x  using bilateral internal mammary arteries;  left leg greater saphenous vein harvested endoscopically  . FOREIGN BODY REMOVAL ABDOMINAL Right 01/27/2016   Procedure: EXPOSURE OF RIGHT COMMON FEMORAL ARTERY AND  REMOVAL FOREIGN;  Surgeon: Evans Lance, MD;  Location: [place];  Service: Cardiovascular;  Laterality: Right;  . HIATAL HERNIA REPAIR     and ulcer repair  . I&D EXTREMITY Left 01/21/2016   Procedure: IRRIGATION AND DEBRIDEMENT EXTREMITY;  Surgeon: Milly Jakob, MD;  Location: [place];  Service: Orthopedics;  Laterality: Left;  . INGUINAL HERNIA REPAIR Right 10/28/2015   Procedure: OPEN RIGHT INGUINAL HERNIA REPAIR;  Surgeon: Greer Pickerel, MD;  Location: [facility];  Service: General;  Laterality: Right;  . INSERTION OF MESH Right 10/28/2015   Procedure: INSERTION OF MESH;  Surgeon: Greer Pickerel, MD;  [facility];  Service: General;  Laterality:  Right;  . IR GENERIC HISTORICAL  02/02/2016   IR FLUORO GUIDE CV LINE LEFT 02/02/2016 Markus Daft, MD [facility]  . IR GENERIC HISTORICAL  02/02/2016   IR US GUIDE VASC ACCESS LEFT 02/02/2016 Markus Daft, MD [facility]  . IR GENERIC HISTORICAL  03/31/2016   IR REMOVAL TUN CV CATH W/O FL 03/31/2016 Arne Cleveland, MD [facility]  . LEFT HEART CATHETERIZATION WITH CORONARY/GRAFT ANGIOGRAM  12/24/2011   Procedure: LEFT HEART CATHETERIZATION WITH Beatrix Fetters;  Surgeon: Sinclair Grooms, MD;  Location: [facility] CATH LAB;  Service: Cardiovascular;;  . LOWER EXTREMITY ANGIOGRAM Bilateral 05/25/2011   Procedure: LOWER EXTREMITY ANGIOGRAM;  Surgeon: Serafina Mitchell, MD;  Location: [facility] CATH LAB;  Service: Cardiovascular;  Laterality: Bilateral;  . OTHER SURGICAL HISTORY  02/13/13   superior mesenteric artery angiogram  . OTHER SURGICAL HISTORY  05/16/12   Stent in stomach  . PACEMAKER GENERATOR CHANGE  12/10/2003   SJM Identity XL DR performed by Dr Leonia Reeves  . PACEMAKER INSERTION  10/18/1994   DDD pacemaker, [employer]. Gen change 12/10/2003.  Marland Kitchen PACEMAKER LEAD REMOVAL Right 01/27/2016   Procedure: PACEMAKER EXTRACTION;  Surgeon: Evans Lance, MD;  Location: [place];  Service: Cardiovascular;  Laterality: Right;  DR. Roxy Manns TO BACKUP CASE  . PARTIAL GASTRECTOMY  1969   Hx of ulcer s/p partial gastrectomy/ has pernicious anemia  . RENAL ANGIOGRAM N/A 02/13/2013   Procedure: RENAL ANGIOGRAM;  Surgeon: Serafina Mitchell, MD;  Location: [facility] CATH LAB;  Service: Cardiovascular;  Laterality: N/A;  . TEE WITHOUT CARDIOVERSION N/A 01/26/2016   Procedure: TRANSESOPHAGEAL ECHOCARDIOGRAM (TEE);  Surgeon: Sueanne Margarita, MD;  Location: [facility] ENDOSCOPY;  Service: Cardiovascular;  Laterality: N/A;  . TEE WITHOUT CARDIOVERSION N/A 01/27/2016   Procedure: TRANSESOPHAGEAL ECHOCARDIOGRAM (TEE);  Surgeon: Evans Lance, MD;  Location: [place];  Service: Cardiovascular;  Laterality: N/A;  . TEE WITHOUT CARDIOVERSION N/A 04/12/2016    Procedure: TRANSESOPHAGEAL ECHOCARDIOGRAM (TEE);  Surgeon: Dorothy Spark, MD;  Location: [place];  Service: Cardiovascular;  Laterality: N/A;  . THROMBECTOMY / EMBOLECTOMY SUBCLAVIAN ARTERY  02/02/10   Right subclavian thromboectomy and venous angioplasty, and chronic mesenteric ischemia with Herculink stenting to superior mesenteric and celiac arteries - Dr. Trula Slade  . TRANSURETHRAL RESECTION OF PROSTATE N/A 02/22/2016   Procedure: CYSTO, CLOT EVACUATION, FULGERATION;  Surgeon: Alexis Frock, MD;  [facility];  Service: Urology;  Laterality: N/A;  . VISCERAL ANGIOGRAM N/A 05/25/2011   Procedure: VISCERAL ANGIOGRAM;  Surgeon: Serafina Mitchell, MD;  Location: [facility] CATH LAB;  Service: Cardiovascular;  Laterality: N/A;  . VISCERAL ANGIOGRAM Bilateral 12/28/2011   Procedure: VISCERAL ANGIOGRAM;  Surgeon: Serafina Mitchell, MD;  Location: [facility] CATH LAB;  Service: Cardiovascular;  Laterality: Bilateral;  . VISCERAL ANGIOGRAM N/A 05/16/2012   Procedure: VISCERAL ANGIOGRAM;  Surgeon: Nada Libman, MD;  Location: [facility] CATH LAB;  Service: Cardiovascular;  Laterality: N/A;  . VISCERAL ANGIOGRAM N/A 02/13/2013   Procedure: VISCERAL ANGIOGRAM;  Surgeon: Nada Libman, MD;  Location: [facility] CATH LAB;  Service: Cardiovascular;  Laterality: N/A;    Family History  Problem Relation Age of Onset  . Diabetes Mother   . Heart disease Mother     Heart Disease before age 74  . Hyperlipidemia Mother   . Hypertension Mother   . CVA Mother 27    cause of death  . Cancer Father     stomach/liver  . Hypertension Father     possibly hypertensive  . Cancer Sister     Breast cancer  . CAD Brother   . Heart disease Brother   . Cancer Paternal Uncle     colon  . Heart disease Sister   . Diabetes Sister   . Hypertension Sister   . Heart disease Daughter     Heart Disease before age 52  . Hypertension Daughter   . Heart attack Daughter     Social History:  reports that he quit smoking about 46 years ago.  His smoking use included Cigarettes. He quit after 20.00 years of use. He has never used smokeless tobacco. He reports that he does not drink alcohol or use drugs.  Allergies:  Allergies  Allergen Reactions  . Nsaids Nausea Only    GI issue  . Ibuprofen Other (See Comments)    GI Issues  . Ace Inhibitors Cough    Medications:  I have reviewed the patient's current medications. Prior to Admission:  Prescriptions Prior to Admission  Medication Sig Dispense Refill Last Dose  . acetaminophen (ARTHRITIS PAIN RELIEF) 650 MG CR tablet Take 650 mg by mouth 3 (three) times daily.    04/08/2016 at Unknown time  . amiodarone (PACERONE) 200 MG tablet Take one-half tablet by  mouth daily 45 tablet 2 04/08/2016 at Unknown time  . atorvastatin (LIPITOR) 80 MG tablet Take 80 mg by mouth at bedtime.    04/07/2016 at Unknown time  . Calcifediol ER (RAYALDEE) 30 MCG CPCR Take 30 mcg by mouth at bedtime.   04/07/2016  . carvedilol (COREG) 12.5 MG tablet Take 12.5 mg by mouth 2 (two) times daily with a meal. Give a one time dose of 6.25 today at 1PM (03/31/16)   04/07/2016 at 2000  . cyanocobalamin (,VITAMIN B-12,) 1000 MCG/ML injection Inject 1,000 mcg into the muscle every 30 (thirty) days. Vitamin B12 - last injection 09/01/15   unknown  . epoetin alfa (EPOGEN,PROCRIT) 03803 UNIT/ML injection Inject 10,000 Units into the skin every 14 (fourteen) days. Done at [facility] center - last injection 09/22/15   04/06/2016  . ferrous sulfate 325 (65 FE) MG tablet Take 1 tablet (325 mg total) by mouth 3 (three) times daily with meals.  3 04/07/2016 at Unknown time  . furosemide (LASIX) 80 MG tablet Take 1 tablet (80 mg total) by mouth daily. 30 tablet  04/08/2016 at Unknown time  . hydrALAZINE (APRESOLINE) 25 MG tablet Take 3 tablets (75 mg total) by mouth 4 (four) times daily.   04/08/2016 at Unknown time  . insulin aspart (NOVOLOG) 100 UNIT/ML injection Inject 0-9 Units into the skin. SSI:  121-150 = 1 unit, 151-200 = 2  units, 201-250 = 3 units, 251-300 = 5 units, 301-350 = 7 units, 351-400 = 9 units   04/07/2016  . isosorbide mononitrate (IMDUR) 120 MG 24 hr tablet  Take 1 tablet by mouth  daily 90 tablet 2 04/08/2016 at Unknown time  . leuprolide (LUPRON) 30 MG injection Inject 30 mg into the muscle every 4 (four) months.   unknown  . Multiple Vitamin (MULTIVITAMIN WITH MINERALS) TABS tablet Take 1 tablet by mouth every morning.   04/08/2016 at Unknown time  . pantoprazole (PROTONIX) 40 MG tablet Take 1 tablet by mouth daily.    04/08/2016 at Unknown time  . potassium chloride SA (K-DUR,KLOR-CON) 20 MEQ tablet Take 20-40 mEq by mouth 2 (two) times daily. Give 40 mEq BID and 20 mEq qhs   04/06/2016  . senna-docusate (SENOKOT-S) 8.6-50 MG tablet Take 2 tablets by mouth 2 (two) times daily.   04/08/2016 at Unknown time  . silodosin (RAPAFLO) 8 MG CAPS capsule Take 8 mg by mouth at bedtime.    04/07/2016 at Unknown time  . sodium bicarbonate 650 MG tablet Take 1 tablet (650 mg total) by mouth 2 (two) times daily.   04/08/2016 at Unknown time  . UNABLE TO FIND Med Name: Med pass 120 mL 2 times daily   04/07/2016 at Unknown time   Scheduled: . amiodarone  100 mg Oral Daily  . aspirin EC  81 mg Oral Daily  . atorvastatin  80 mg Oral QHS  . calcium-vitamin D  1 tablet Oral Q breakfast  . carvedilol  12.5 mg Oral BID WC  .  ceFAZolin (ANCEF) IV  1 g Intravenous Q12H  . Chlorhexidine Gluconate Cloth  6 each Topical Q0600  . ferrous sulfate  325 mg Oral TID WC  . furosemide  40 mg Oral Daily  . heparin  5,000 Units Subcutaneous Q8H  . hydrALAZINE  75 mg Oral QID  . insulin aspart  0-15 Units Subcutaneous Q4H  . isosorbide mononitrate  120 mg Oral Daily  . mupirocin ointment  1 application Nasal BID  . predniSONE  40 mg Oral Q breakfast  . sodium chloride flush  3 mL Intravenous Q12H  . tamsulosin  0.4 mg Oral QHS  . vancomycin  125 mg Oral QID   Continuous:  TKW:[id number] **OR** acetaminophen, hydrALAZINE,  HYDROcodone-acetaminophen, ondansetron **OR** ondansetron (ZOFRAN) IV Anti-infectives    Start     Dose/Rate Route Frequency Ordered Stop   04/15/16 0000  ceFAZolin (ANCEF) IVPB 1 g/50 mL premix     1 g 100 mL/hr over 30 Minutes Intravenous Every 12 hours 04/14/16 1502     04/14/16 1100  ceFAZolin (ANCEF) IVPB 2g/100 mL premix  Status:  Discontinued     2 g 200 mL/hr over 30 Minutes Intravenous Every 8 hours 04/14/16 1036 04/14/16 1500   04/10/16 0000  metroNIDAZOLE (FLAGYL) 500 MG tablet     500 mg Oral 3 times daily 04/10/16 1407     04/10/16 0000  vancomycin (VANCOCIN) 50 mg/mL oral solution     125 mg Oral 4 times daily 04/10/16 1407     04/09/16 1800  vancomycin (VANCOCIN) IVPB 1000 mg/200 mL premix  Status:  Discontinued     1,000 mg 200 mL/hr over 60 Minutes Intravenous Every 24 hours 04/08/16 1700 04/08/16 1822   04/09/16 1000  vancomycin (VANCOCIN) 50 mg/mL oral solution 125 mg     125 mg Oral 4 times daily 04/09/16 0816 04/23/16 0959   04/09/16 0800  ciprofloxacin (CIPRO) IVPB 400 mg  Status:  Discontinued     400 mg 200 mL/hr over 60 Minutes Intravenous Every 12 hours 04/08/16 1833 04/09/16 0849   04/09/16 0200  piperacillin-tazobactam (ZOSYN) IVPB 3.375 g  Status:  Discontinued     3.375 g 12.5 mL/hr over 240 Minutes Intravenous Every 8 hours 04/08/16 1700 04/08/16 1831   04/09/16 0000  metroNIDAZOLE (FLAGYL) IVPB 500 mg  Status:  Discontinued     500 mg 100 mL/hr over 60 Minutes Intravenous Every 8 hours 04/08/16 1822 04/13/16 1421   04/08/16 1845  ciprofloxacin (CIPRO) IVPB 400 mg     400 mg 200 mL/hr over 60 Minutes Intravenous  Once 04/08/16 1833 04/08/16 2154   04/08/16 1700  piperacillin-tazobactam (ZOSYN) IVPB 3.375 g     3.375 g 100 mL/hr over 30 Minutes Intravenous  Once 04/08/16 1655 04/08/16 1744   04/08/16 1700  vancomycin (VANCOCIN) IVPB 1000 mg/200 mL premix  Status:  Discontinued     1,000 mg 200 mL/hr over 60 Minutes Intravenous  Once 04/08/16 1655  04/08/16 1822      Results for orders placed or performed during the hospital encounter of 04/08/16 (from the past 48 hour(s))  Glucose, capillary     Status: None   Collection Time: 04/13/16  7:49 AM  Result Value Ref Range   Glucose-Capillary 97 65 - 99 mg/dL  Culture, blood (single)     Status: None (Preliminary result)   Collection Time: 04/13/16  9:37 AM  Result Value Ref Range   Specimen Description BLOOD RIGHT ARM    Special Requests BOTTLES DRAWN AEROBIC AND ANAEROBIC 5.5CC    Culture      NO GROWTH 1 DAY Performed at [facility]    Report Status PENDING   Uric acid     Status: None   Collection Time: 04/13/16 10:29 AM  Result Value Ref Range   Uric Acid, Serum 6.8 4.4 - 7.6 mg/dL  Glucose, capillary     Status: Abnormal   Collection Time: 04/13/16 11:42 AM  Result Value Ref Range   Glucose-Capillary 141 (H) 65 - 99 mg/dL  Culture, blood (single)     Status: None (Preliminary result)   Collection Time: 04/13/16  2:30 PM  Result Value Ref Range   Specimen Description BLOOD RIGHT ARM    Special Requests BOTTLES DRAWN AEROBIC AND ANAEROBIC 5CC    Culture      NO GROWTH < 24 HOURS Performed at [facility]    Report Status PENDING   Glucose, capillary     Status: Abnormal   Collection Time: 04/13/16  4:01 PM  Result Value Ref Range   Glucose-Capillary 147 (H) 65 - 99 mg/dL  Glucose, capillary     Status: Abnormal   Collection Time: 04/13/16  8:17 PM  Result Value Ref Range   Glucose-Capillary 169 (H) 65 - 99 mg/dL  Glucose, capillary     Status: Abnormal   Collection Time: 04/14/16 12:24 AM  Result Value Ref Range   Glucose-Capillary 106 (H) 65 - 99 mg/dL  Glucose, capillary     Status: None   Collection Time: 04/14/16  3:50 AM  Result Value Ref Range   Glucose-Capillary 99 65 - 99 mg/dL  CBC     Status: Abnormal   Collection Time: 04/14/16  5:38 AM  Result Value Ref Range   WBC 6.0 4.0 - 10.5 K/uL   RBC 3.08 (L) 4.22 - 5.81 MIL/uL    Hemoglobin 9.4 (L) 13.0 - 17.0 g/dL   HCT 29.4 (L) 39.0 - 52.0 %   MCV 95.5 78.0 - 100.0 fL   MCH 30.5 26.0 - 34.0 pg   MCHC  32.0 30.0 - 36.0 g/dL   RDW 20.9 (H) 11.5 - 15.5 %   Platelets 152 150 - 400 K/uL  Basic metabolic panel     Status: Abnormal   Collection Time: 04/14/16  5:38 AM  Result Value Ref Range   Sodium 138 135 - 145 mmol/L   Potassium 3.5 3.5 - 5.1 mmol/L   Chloride 108 101 - 111 mmol/L   CO2 23 22 - 32 mmol/L   Glucose, Bld 99 65 - 99 mg/dL   BUN 32 (H) 6 - 20 mg/dL   Creatinine, Ser 1.85 (H) 0.61 - 1.24 mg/dL   Calcium 8.7 (L) 8.9 - 10.3 mg/dL   GFR calc non Af Amer 33 (L) >60 mL/min   GFR calc Af Amer 39 (L) >60 mL/min    Comment: (NOTE) The eGFR has been calculated using the CKD EPI equation. This calculation has not been validated in all clinical situations. eGFR's persistently <60 mL/min signify possible Chronic Kidney Disease.    Anion gap 7 5 - 15  Glucose, capillary     Status: None   Collection Time: 04/14/16  8:07 AM  Result Value Ref Range   Glucose-Capillary 92 65 - 99 mg/dL  Glucose, capillary     Status: Abnormal   Collection Time: 04/14/16 12:04 PM  Result Value Ref Range   Glucose-Capillary 149 (H) 65 - 99 mg/dL  Glucose, capillary     Status: Abnormal   Collection Time: 04/14/16  3:50 PM  Result Value Ref Range   Glucose-Capillary 161 (H) 65 - 99 mg/dL  Glucose, capillary     Status: Abnormal   Collection Time: 04/14/16  8:01 PM  Result Value Ref Range   Glucose-Capillary 181 (H) 65 - 99 mg/dL  Glucose, capillary     Status: Abnormal   Collection Time: 04/15/16 12:21 AM  Result Value Ref Range   Glucose-Capillary 112 (H) 65 - 99 mg/dL  CBC     Status: Abnormal   Collection Time: 04/15/16  3:51 AM  Result Value Ref Range   WBC 6.8 4.0 - 10.5 K/uL   RBC 2.97 (L) 4.22 - 5.81 MIL/uL   Hemoglobin 9.1 (L) 13.0 - 17.0 g/dL   HCT 28.2 (L) 39.0 - 52.0 %   MCV 94.9 78.0 - 100.0 fL   MCH 30.6 26.0 - 34.0 pg   MCHC 32.3 30.0 - 36.0 g/dL     RDW 20.6 (H) 11.5 - 15.5 %   Platelets 162 150 - 400 K/uL  Basic metabolic panel     Status: Abnormal   Collection Time: 04/15/16  3:51 AM  Result Value Ref Range   Sodium 136 135 - 145 mmol/L   Potassium 3.5 3.5 - 5.1 mmol/L   Chloride 109 101 - 111 mmol/L   CO2 23 22 - 32 mmol/L   Glucose, Bld 107 (H) 65 - 99 mg/dL   BUN 30 (H) 6 - 20 mg/dL   Creatinine, Ser 1.79 (H) 0.61 - 1.24 mg/dL   Calcium 8.5 (L) 8.9 - 10.3 mg/dL   GFR calc non Af Amer 35 (L) >60 mL/min   GFR calc Af Amer 40 (L) >60 mL/min    Comment: (NOTE) The eGFR has been calculated using the CKD EPI equation. This calculation has not been validated in all clinical situations. eGFR's persistently <60 mL/min signify possible Chronic Kidney Disease.    Anion gap 4 (L) 5 - 15  Glucose, capillary     Status: Abnormal   Collection Time:  04/15/16  5:15 AM  Result Value Ref Range   Glucose-Capillary 119 (H) 65 - 99 mg/dL    No results found.  Review of Systems  Constitutional: Positive for malaise/fatigue. Negative for chills and fever.       Poor appetite  HENT: Negative.   Eyes: Negative.   Respiratory: Positive for cough and shortness of breath. Negative for sputum production.   Cardiovascular: Positive for leg swelling. Negative for chest pain.  Gastrointestinal: Positive for abdominal pain and diarrhea.  Genitourinary: Negative.   Musculoskeletal: Negative.   Skin: Negative.   Neurological: Negative.   Endo/Heme/Allergies: Negative.   Psychiatric/Behavioral: Negative.    Blood pressure (!) 170/65, pulse 81, temperature 98.5 F (36.9 C), temperature source Oral, resp. rate 16, height '5\' 10"'$  (1.778 m), weight 79.6 kg (175 lb 7.8 oz), SpO2 99 %. Physical Exam  Constitutional: He is oriented to person, place, and time. He appears well-developed. No distress.  HENT:  Head: Normocephalic and atraumatic.  Eyes: EOM are normal. Pupils are equal, round, and reactive to light.  Neck: JVD present.   Cardiovascular: Normal rate, regular rhythm and normal heart sounds.   No murmur heard. Respiratory: Effort normal. He has rales.  Decreased breath sounds in lung bases  GI: Soft. Bowel sounds are normal. He exhibits no distension. There is no tenderness.  Musculoskeletal: He exhibits edema.  anasarca  Neurological: He is alert and oriented to person, place, and time.  Skin: Skin is warm and dry.  Psychiatric: He has a normal mood and affect.   Result status: Edited Result - FINAL                              *[facility]*                         [address] [place], [place] [address]                            [phone]  ------------------------------------------------------------------- Transesophageal Echocardiography  (Report amended )  Patient:    [name], [name] MR #:       [id number] Study Date: 04/12/2016 Gender:     M Age:        53 Height:     177.8 cm Weight:     79.5 kg BSA:        1.99 m^2 Pt. Status: Room:       [place] [place]  PERFORMING   [name], M.D.  SONOGRAPHER  [facility]  ORDERING     Angelena Form R  REFERRING    Angelena Form R  cc:  ------------------------------------------------------------------- LV EF: 55% -   60%  ------------------------------------------------------------------- Indications:      Endocarditis 421.9.  ------------------------------------------------------------------- History:   PMH:  Tircuspid vegetation. CKD.  Atrial fibrillation. Risk factors:  Hypertension. Diabetes mellitus.  ------------------------------------------------------------------- Study Conclusions  - Left ventricle: Wall thickness was increased in a pattern of   moderate LVH. Systolic function was normal. The estimated   ejection fraction  was in the range of 55% to 60%. Wall motion was    normal; there were no regional wall motion abnormalities. - Aortic valve: There was trivial regurgitation. - Aorta: Severe non-mobile atherosclerotic plaque. - Ascending aorta: The ascending aorta was normal in size. - Mitral valve: There was mild regurgitation. - Left atrium: The atrium was dilated. No evidence of thrombus in   the atrial cavity or appendage. - Right ventricle: Systolic function was normal. - Right atrium: No evidence of thrombus in the atrial cavity or   appendage. - Atrial septum: No defect or patent foramen ovale was identified. - Tricuspid valve: There was mild-moderate regurgitation.  Impressions:  - Tricuspid valve is significantly thickened, predominantly the   posterior leaflet. There is a mobile echodensity measuring 1.6 x   1.5 cm attached to the posterior leaflet suspicious for a   vegetation.  ------------------------------------------------------------------- Study data:   Study status:  Routine.  Consent:  The risks, benefits, and alternatives to the procedure were explained to the patient and informed consent was obtained.  Procedure:  Initial setup. The patient was brought to the laboratory. Surface ECG leads were monitored. Sedation. Conscious sedation was administered by cardiology staff. Transesophageal echocardiography. An adult multiplane transesophageal probe was inserted by the attending cardiologistwithout difficulty. Image quality was adequate.  Study completion:  The patient tolerated the procedure well. There were no complications.  Administered medications:   Fentanyl, 87mg, IV.  Midazolam, 537m IV.          Diagnostic transesophageal echocardiography.  2D and color Doppler.  Birthdate:  Patient birthdate: 02May 09, 1937 Age:  Patient is [age]r old.  Sex:  Gender: male.    BMI: 25.2 kg/m^2.  Blood pressure:     192/70  Patient status:  Inpatient.  Study date:  Study date: 04/12/2016. Study time: 04:39 PM.  Location:   Endoscopy.  -------------------------------------------------------------------  ------------------------------------------------------------------- Left ventricle:   Wall thickness was increased in a pattern of moderate LVH.   Systolic function was normal. The estimated ejection fraction was in the range of 55% to 60%. Wall motion was normal; there were no regional wall motion abnormalities.  ------------------------------------------------------------------- Aortic valve:   Trileaflet; moderately thickened, moderately calcified leaflets. Cusp separation was normal.  Doppler:  There was trivial regurgitation.  ------------------------------------------------------------------- Aorta:  Severe non-mobile atherosclerotic plaque. There was no atheroma. There was no evidence for dissection. Aortic root: The aortic root was not dilated. Ascending aorta: The ascending aorta was normal in size. Descending aorta: The descending aorta was normal in size.  ------------------------------------------------------------------- Mitral valve:   Structurally normal valve.   Leaflet separation was normal.  Doppler:  There was mild regurgitation.  ------------------------------------------------------------------- Left atrium:  The atrium was dilated.  No evidence of thrombus in the atrial cavity or appendage. The appendage was morphologically a left appendage, multilobulated, and of normal size. Emptying velocity was normal.  ------------------------------------------------------------------- Atrial septum:  No defect or patent foramen ovale was identified.   ------------------------------------------------------------------- Right ventricle:  The cavity size was normal. Wall thickness was normal. Systolic function was normal.  ------------------------------------------------------------------- Pulmonic valve:    Structurally normal  valve.  ------------------------------------------------------------------- Tricuspid valve:  Tricuspid valve is significantly thickened, predominantly the posterior leaflet. Leaflet separation was normal.  Doppler:  There was mild-moderate regurgitation.  ------------------------------------------------------------------- Pulmonary artery:   The main pulmonary artery was normal-sized.  ------------------------------------------------------------------- Right atrium:  The atrium was normal in size. Pacer wire or catheter noted in right atrium.  No evidence of thrombus in the atrial cavity  or appendage. The appendage was morphologically a right appendage.  ------------------------------------------------------------------- Pericardium:  There was no pericardial effusion.   ------------------------------------------------------------------- Post procedure conclusions Ascending Aorta:  - Severe non-mobile atherosclerotic plaque.  ------------------------------------------------------------------- Lance Morin, M.D. 2017-10-02T17:25:13   Assessment/Plan:  I have personally reviewed and interpreted his TEE. He has diffuse thickening of the posterior tricuspid valve leaflet with a 1.5 cm mobile echodensity consistent with a vegetation. Sometimes this thickening is due having a pacing lead going through the valve with development of pannus but there is no way to be sure. I think we have to assume it is a vegetation and treat him for endocarditis. He has mild to moderate TR which should not cause him any problems and frequently TV endocarditis can be resolved with antibiotics if caught before the valve is destroyed. I don't think he is a candidate for tricuspid valve replacement due to his age combined with having two prior heart surgeries, having a diffusely calcified and atherosclerotic aorta and peripheral vessels on CT scan making cardiopulmonary bypass extremely  hazardous, and his multiple other comorbid medical conditions including active C. Diff colitis. There is no indication for surgical treatment at this time anyway even if he were in good enough condition to tolerate it. I would continue antibiotics as per ID. I discussed all of this with him and he understands and agrees with antibiotic treatment. Thanks for the consult.  Gaye Pollack 04/15/2016, 5:51 AM

## 2016-04-15 NOTE — Progress Notes (Signed)
Pt selected [facility] for HH needs and IVF.  Referral given to in house rep.

## 2016-04-15 NOTE — Progress Notes (Signed)
INFECTIOUS DISEASE PROGRESS NOTE  ID: [name]. is a 80 y.o. male with  Active Problems:   PAF (paroxysmal atrial fibrillation) (HCC)   Chronic diastolic heart failure (HCC)   Diabetes mellitus ([place])   Essential hypertension   HCAP (healthcare-associated pneumonia)   Colitis   C. difficile colitis   Endocarditis of tricuspid valve  Subjective: Without complaints.   Abtx:  Anti-infectives    Start     Dose/Rate Route Frequency Ordered Stop   04/15/16 0000  ceFAZolin (ANCEF) IVPB 1 g/50 mL premix     1 g 100 mL/hr over 30 Minutes Intravenous Every 12 hours 04/14/16 1502     04/14/16 1100  ceFAZolin (ANCEF) IVPB 2g/100 mL premix  Status:  Discontinued     2 g 200 mL/hr over 30 Minutes Intravenous Every 8 hours 04/14/16 1036 04/14/16 1500   04/10/16 0000  metroNIDAZOLE (FLAGYL) 500 MG tablet     500 mg Oral 3 times daily 04/10/16 1407     04/10/16 0000  vancomycin (VANCOCIN) 50 mg/mL oral solution     125 mg Oral 4 times daily 04/10/16 1407     04/09/16 1800  vancomycin (VANCOCIN) IVPB 1000 mg/200 mL premix  Status:  Discontinued     1,000 mg 200 mL/hr over 60 Minutes Intravenous Every 24 hours 04/08/16 1700 04/08/16 1822   04/09/16 1000  vancomycin (VANCOCIN) 50 mg/mL oral solution 125 mg     125 mg Oral 4 times daily 04/09/16 0816 04/23/16 0959   04/09/16 0800  ciprofloxacin (CIPRO) IVPB 400 mg  Status:  Discontinued     400 mg 200 mL/hr over 60 Minutes Intravenous Every 12 hours 04/08/16 1833 04/09/16 0849   04/09/16 0200  piperacillin-tazobactam (ZOSYN) IVPB 3.375 g  Status:  Discontinued     3.375 g 12.5 mL/hr over 240 Minutes Intravenous Every 8 hours 04/08/16 1700 04/08/16 1831   04/09/16 0000  metroNIDAZOLE (FLAGYL) IVPB 500 mg  Status:  Discontinued     500 mg 100 mL/hr over 60 Minutes Intravenous Every 8 hours 04/08/16 1822 04/13/16 1421   04/08/16 1845  ciprofloxacin (CIPRO) IVPB 400 mg     400 mg 200 mL/hr over 60 Minutes Intravenous  Once 04/08/16  1833 04/08/16 2154   04/08/16 1700  piperacillin-tazobactam (ZOSYN) IVPB 3.375 g     3.375 g 100 mL/hr over 30 Minutes Intravenous  Once 04/08/16 1655 04/08/16 1744   04/08/16 1700  vancomycin (VANCOCIN) IVPB 1000 mg/200 mL premix  Status:  Discontinued     1,000 mg 200 mL/hr over 60 Minutes Intravenous  Once 04/08/16 1655 04/08/16 1822      Medications:  Scheduled: . amiodarone  100 mg Oral Daily  . aspirin EC  81 mg Oral Daily  . atorvastatin  80 mg Oral QHS  . calcium-vitamin D  1 tablet Oral Q breakfast  . carvedilol  25 mg Oral BID WC  .  ceFAZolin (ANCEF) IV  1 g Intravenous Q12H  . Chlorhexidine Gluconate Cloth  6 each Topical Q0600  . ferrous sulfate  325 mg Oral TID WC  . furosemide  40 mg Oral Daily  . heparin  5,000 Units Subcutaneous Q8H  . hydrALAZINE  100 mg Oral Q8H  . insulin aspart  0-15 Units Subcutaneous Q4H  . isosorbide mononitrate  120 mg Oral Daily  . mupirocin ointment  1 application Nasal BID  . predniSONE  40 mg Oral Q breakfast  . sodium chloride flush  3 mL  Intravenous Q12H  . tamsulosin  0.4 mg Oral QHS  . vancomycin  125 mg Oral QID    Objective: Vital signs in last 24 hours: Temp:  [98.5 F (36.9 C)-99.2 F (37.3 C)] 98.5 F (36.9 C) (10/05 0520) Pulse Rate:  [77-81] 81 (10/05 0520) Resp:  [16-18] 16 (10/05 0520) BP: (155-170)/(61-65) 170/65 (10/05 0520) SpO2:  [99 %-100 %] 99 % (10/05 0520)   General appearance: alert, cooperative and no distress Resp: clear to auscultation bilaterally Cardio: regular rate and rhythm GI: normal findings: bowel sounds normal and soft, non-tender  Lab Results  Recent Labs  04/14/16 0538 04/15/16 0351  WBC 6.0 6.8  HGB 9.4* 9.1*  HCT 29.4* 28.2*  NA 138 136  K 3.5 3.5  CL 108 109  CO2 23 23  BUN 32* 30*  CREATININE 1.85* 1.79*   Liver Panel No results for input(s): PROT, ALBUMIN, AST, ALT, ALKPHOS, BILITOT, BILIDIR, IBILI in the last 72 hours. Sedimentation Rate No results for input(s):  ESRSEDRATE in the last 72 hours. C-Reactive Protein No results for input(s): CRP in the last 72 hours.  Microbiology: Recent Results (from the past 240 hour(s))  MRSA PCR Screening     Status: Abnormal   Collection Time: 04/08/16 12:10 AM  Result Value Ref Range Status   MRSA by PCR POSITIVE (A) NEGATIVE Final    Comment:        The GeneXpert MRSA Assay (FDA approved for NASAL specimens only), is one component of a comprehensive MRSA colonization surveillance program. It is not intended to diagnose MRSA infection nor to guide or monitor treatment for MRSA infections. RESULT CALLED TO, READ BACK BY AND VERIFIED WITH: SEARS RN AT 0800 ON 09.29.17 BY SHUEA   Urine culture     Status: Abnormal   Collection Time: 04/08/16  2:49 PM  Result Value Ref Range Status   Specimen Description URINE, CLEAN CATCH  Final   Special Requests NONE  Final   Culture MULTIPLE SPECIES PRESENT, SUGGEST RECOLLECTION (A)  Final   Report Status 04/10/2016 FINAL  Final  C difficile quick scan w PCR reflex     Status: Abnormal   Collection Time: 04/08/16  4:40 PM  Result Value Ref Range Status   C Diff antigen POSITIVE (A) NEGATIVE Final   C Diff toxin POSITIVE (A) NEGATIVE Final   C Diff interpretation Toxin producing C. difficile detected.  Final    Comment: CRITICAL RESULT CALLED TO, READ BACK BY AND VERIFIED WITH: S.SEAGRAVES RN [id number].[email or web address]   Culture, blood (single)     Status: None (Preliminary result)   Collection Time: 04/13/16  9:37 AM  Result Value Ref Range Status   Specimen Description BLOOD RIGHT ARM  Final   Special Requests BOTTLES DRAWN AEROBIC AND ANAEROBIC 5.5CC  Final   Culture   Final    NO GROWTH 2 DAYS Performed at [facility]    Report Status PENDING  Incomplete  Culture, blood (single)     Status: None (Preliminary result)   Collection Time: 04/13/16  2:30 PM  Result Value Ref Range Status   Specimen Description BLOOD RIGHT ARM  Final   Special Requests  BOTTLES DRAWN AEROBIC AND ANAEROBIC 5CC  Final   Culture   Final    NO GROWTH 2 DAYS Performed at [facility]    Report Status PENDING  Incomplete  Culture, blood (single)     Status: None (Preliminary result)   Collection Time: 04/14/16  5:38  AM  Result Value Ref Range Status   Specimen Description BLOOD RIGHT ARM  Final   Special Requests BOTTLES DRAWN AEROBIC AND ANAEROBIC 5CC  Final   Culture   Final    NO GROWTH 1 DAY Performed at [facility]    Report Status PENDING  Incomplete    Studies/Results: No results found.   Assessment/Plan: Endocarditis, TV Await his BCx (times 3), so far ngtd I am very suspicous he has endocarditis from his prev bacteremia Will place [facility] D/c planning.          C diff Would give him 14 days of po vanco  Total days of antibiotics: 1 Ancef, 6 po vanco  Bobby Rumpf Infectious Diseases (pager) [phone] [email or web address] 04/15/2016, 3:25 PM  LOS: 6 days

## 2016-04-15 NOTE — Progress Notes (Addendum)
PROGRESS NOTE                                                                                                                                                                                                             Patient Demographics:    [name], is a 80 y.o. male, DOB - [birth year], TX:[id number]  Admit date - 04/08/2016   Admitting Physician Donne Hazel, MD  Outpatient Primary MD for the patient is [name], MD  LOS - 6  Outpatient Specialists: Cardiology  Chief Complaint  Patient presents with  . Abdominal Pain       Brief Narrative   80 year old male with history of A. fib on aspirin and amiodarone, CAD status post CABG, history of C. 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 Patient presented to the ED with abdominal pain, poor appetite and diarrhea with associated shortness of breath since 2 days prior to admission. Patient had hypertensive urgency on presentation (237/97 mmHg), mild leukocytosis (12.3) and mildly elevated troponin (0.07) EKG showed inverted T waves in lateral leads. Denied chest pain symptoms. CT abdomen showed diffuse acute colitis favoring infection. Patient started on empiric Cipro and Flagyl. Stool for C. difficile was positive and she is to oral vancomycin. 2-D echo done given mild elevated troponin showed a possible tricuspid vegetation. This was followed by a TEE which showed a tricuspid vegetation.    Subjective:   No overnight issues. Lt ankle pain better.   Assessment  & Plan :     Principal problem Infective endocarditis with tricuspid valve vegetation Confirmed on TEE. 3 sets of blood cultures sent, no growth on  2 sets, 3rd set pending.  Resumed Ancef. Appreciate ID consult. Suspected endocarditis from prior bacteremia/endocarditis.  thoracic surgery consulted who  recommend that he is not a candidate for surgical treatment given his, age  multiple co morbidities, prior CABG and calcified vessels. Recommend to continue antibiotics. If 3rd set of cx negative, will order PICC placement. abx course per ID.  Active Problems: Acute C. difficile colitis Abdominal pain and diarrhea resolved. Oral vancomycin for 2 weeks course.(6/14).     PAF (paroxysmal atrial fibrillation) (  [place]) [place] of 6. On anticoagulation with aspirin. Continue Coreg and amiodarone.    Chronic diastolic heart failure (HCC) Has trace leg edema. Continue Lasix.  Acute gouty arthritis of left ankle  oral prednisone for 5 day course. Symptoms better today.    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     Essential hypertension BP persistently high. Increase coreg to 25 mg bid. continue  Imdur, hydralazine and Lasix  CK D stage III Renal function at baseline.  Anemia of chronic kidney disease Stable.   Hypokalemia Replenished    Code Status : Full code  Family Communication  : None at bedside  Disposition Plan  : Home pending blood culture results, needs PICC line for long-term antibiotics.likely in am.  Barriers For Discharge : Pending final blood culture results; PICC Placement  Consults  :  ID Cardiac thoracic surgery  Procedures  :  CT abdomen 2-D echo TEE  DVT Prophylaxis  :  - Heparin   Lab Results  Component Value Date   PLT 162 04/15/2016    Antibiotics  :   Anti-infectives    Start     Dose/Rate Route Frequency Ordered Stop   04/15/16 0000  ceFAZolin (ANCEF) IVPB 1 g/50 mL premix     1 g 100 mL/hr over 30 Minutes Intravenous Every 12 hours 04/14/16 1502     04/14/16 1100  ceFAZolin (ANCEF) IVPB 2g/100 mL premix   Status:  Discontinued     2 g 200 mL/hr over 30 Minutes Intravenous Every 8 hours 04/14/16 1036 04/14/16 1500   04/10/16 0000  metroNIDAZOLE (FLAGYL) 500 MG tablet     500 mg Oral 3 times daily 04/10/16 1407     04/10/16 0000  vancomycin (VANCOCIN) 50 mg/mL oral solution     125 mg Oral 4 times daily 04/10/16 1407     04/09/16 1800  vancomycin (VANCOCIN) IVPB 1000 mg/200 mL premix  Status:  Discontinued     1,000 mg 200 mL/hr over 60 Minutes Intravenous Every 24 hours 04/08/16 1700 04/08/16 1822   04/09/16 1000  vancomycin (VANCOCIN) 50 mg/mL oral solution 125 mg     125 mg Oral 4 times daily 04/09/16 0816 04/23/16 0959   04/09/16 0800  ciprofloxacin (CIPRO) IVPB 400 mg  Status:  Discontinued     400 mg 200 mL/hr over 60 Minutes Intravenous Every 12 hours 04/08/16 1833 04/09/16 0849   04/09/16 0200  piperacillin-tazobactam (ZOSYN) IVPB 3.375 g  Status:  Discontinued     3.375 g 12.5 mL/hr over 240 Minutes Intravenous Every 8 hours 04/08/16 1700 04/08/16 1831   04/09/16 0000  metroNIDAZOLE (FLAGYL) IVPB 500 mg  Status:  Discontinued     500 mg 100 mL/hr over 60 Minutes Intravenous Every 8 hours 04/08/16 1822 04/13/16 1421   04/08/16 1845  ciprofloxacin (CIPRO) IVPB 400 mg     400 mg 200 mL/hr over 60 Minutes Intravenous  Once 04/08/16 1833 04/08/16 2154   04/08/16 1700  piperacillin-tazobactam (ZOSYN) IVPB 3.375 g     3.375 g 100 mL/hr over 30 Minutes Intravenous  Once 04/08/16 1655 04/08/16 1744   04/08/16 1700  vancomycin (VANCOCIN) IVPB 1000 mg/200 mL premix  Status:  Discontinued     1,000 mg 200 mL/hr over 60 Minutes Intravenous  Once 04/08/16 1655 04/08/16 1822        Objective:   Vitals:   04/13/16 2015 04/14/16 0651 04/14/16 2012 04/15/16 0520  BP: Marland Kitchen)  149/56 (!) 180/74 (!) 155/61 (!) 170/65  Pulse: 72 80 77 81  Resp: 16 18 18 16   Temp: 98.4 F (36.9 C) 98.8 F (37.1 C) 99.2 F (37.3 C) 98.5 F (36.9 C)  TempSrc: Oral Oral Axillary Oral  SpO2: 100% 100% 100% 99%    Weight:      Height:        Wt Readings from Last 3 Encounters:  04/09/16 79.6 kg (175 lb 7.8 oz)  04/08/16 79.8 kg (176 lb)  03/31/16 79.8 kg (176 lb)     Intake/Output Summary (Last 24 hours) at 04/15/16 1258 Last data filed at 04/15/16 0829  Gross per 24 hour  Intake              165 ml  Output              800 ml  Net             -635 ml     Physical Exam  Gen: not in distress HEENT:moist mucosa, supple neck Chest: clear b/l, no added sounds CVS: N S1&S2, no murmurs, rubs or gallop GI: soft, NT, ND, BS+ Musculoskeletal: warm, trace edema bilaterally, left ankle tenderness improved     Data Review:    CBC  Recent Labs Lab 04/09/16 0530 04/10/16 0527 04/11/16 0528 04/14/16 0538 04/15/16 0351  WBC 13.6* 7.2 6.5 6.0 6.8  HGB 9.6* 8.2* 8.8* 9.4* 9.1*  HCT 30.4* 26.6* 27.8* 29.4* 28.2*  PLT 141* 129* 132* 152 162  MCV 96.8 94.3 96.2 95.5 94.9  MCH 30.6 29.1 30.4 30.5 30.6  MCHC 31.6 30.8 31.7 32.0 32.3  RDW 23.3* 22.2* 22.0* 20.9* 20.6*    Chemistries   Recent Labs Lab 04/08/16 1503 04/09/16 0530 04/10/16 0527 04/14/16 0538 04/15/16 0351  NA 140 142 138 138 136  K 3.8 3.0* 3.5 3.5 3.5  CL 109 112* 111 108 109  CO2 24 23 23 23 23   GLUCOSE 152* 86 80 99 107*  BUN 22* 22* 25* 32* 30*  CREATININE 1.59* 1.49* 2.10* 1.85* 1.79*  CALCIUM 8.8* 8.5* 8.1* 8.7* 8.5*  AST 24 18  --   --   --   ALT 10* 8*  --   --   --   ALKPHOS 58 47  --   --   --   BILITOT 0.6 0.5  --   --   --    ------------------------------------------------------------------------------------------------------------------ No results for input(s): CHOL, HDL, LDLCALC, TRIG, CHOLHDL, LDLDIRECT in the last 72 hours.  Lab Results  Component Value Date   HGBA1C 4.9 02/20/2016   ------------------------------------------------------------------------------------------------------------------ No results for input(s): TSH, T4TOTAL, T3FREE, THYROIDAB in the last 72 hours.  Invalid  input(s): FREET3 ------------------------------------------------------------------------------------------------------------------ No results for input(s): VITAMINB12, FOLATE, FERRITIN, TIBC, IRON, RETICCTPCT in the last 72 hours.  Coagulation profile  Recent Labs Lab 04/08/16 1503  INR 1.00    No results for input(s): DDIMER in the last 72 hours.  Cardiac Enzymes  Recent Labs Lab 04/08/16 2303 04/09/16 0530 04/09/16 1110  TROPONINI 0.07* 0.08* 0.07*   ------------------------------------------------------------------------------------------------------------------ No results found for: BNP  Inpatient Medications  Scheduled Meds: . amiodarone  100 mg Oral Daily  . aspirin EC  81 mg Oral Daily  . atorvastatin  80 mg Oral QHS  . calcium-vitamin D  1 tablet Oral Q breakfast  . carvedilol  12.5 mg Oral BID WC  .  ceFAZolin (ANCEF) IV  1 g Intravenous Q12H  . Chlorhexidine Gluconate Cloth  6  each Topical [id number]  . ferrous sulfate  325 mg Oral TID WC  . furosemide  40 mg Oral Daily  . heparin  5,000 Units Subcutaneous Q8H  . hydrALAZINE  75 mg Oral QID  . insulin aspart  0-15 Units Subcutaneous Q4H  . isosorbide mononitrate  120 mg Oral Daily  . mupirocin ointment  1 application Nasal BID  . predniSONE  40 mg Oral Q breakfast  . sodium chloride flush  3 mL Intravenous Q12H  . tamsulosin  0.4 mg Oral QHS  . vancomycin  125 mg Oral QID   Continuous Infusions:  PRN Meds:.acetaminophen **OR** acetaminophen, hydrALAZINE, HYDROcodone-acetaminophen, ondansetron **OR** ondansetron (ZOFRAN) IV  Micro Results Recent Results (from the past 240 hour(s))  MRSA PCR Screening     Status: Abnormal   Collection Time: 04/08/16 12:10 AM  Result Value Ref Range Status   MRSA by PCR POSITIVE (A) NEGATIVE Final    Comment:        The GeneXpert MRSA Assay (FDA approved for NASAL specimens only), is one component of a comprehensive MRSA colonization surveillance program. It is  not intended to diagnose MRSA infection nor to guide or monitor treatment for MRSA infections. RESULT CALLED TO, READ BACK BY AND VERIFIED WITH: SEARS RN AT 0800 ON 09.29.17 BY SHUEA   Urine culture     Status: Abnormal   Collection Time: 04/08/16  2:49 PM  Result Value Ref Range Status   Specimen Description URINE, CLEAN CATCH  Final   Special Requests NONE  Final   Culture MULTIPLE SPECIES PRESENT, SUGGEST RECOLLECTION (A)  Final   Report Status 04/10/2016 FINAL  Final  C difficile quick scan w PCR reflex     Status: Abnormal   Collection Time: 04/08/16  4:40 PM  Result Value Ref Range Status   C Diff antigen POSITIVE (A) NEGATIVE Final   C Diff toxin POSITIVE (A) NEGATIVE Final   C Diff interpretation Toxin producing C. difficile detected.  Final    Comment: CRITICAL RESULT CALLED TO, READ BACK BY AND VERIFIED WITH: S.SEAGRAVES RN [id number] [email or web address]   Culture, blood (single)     Status: None (Preliminary result)   Collection Time: 04/13/16  9:37 AM  Result Value Ref Range Status   Specimen Description BLOOD RIGHT ARM  Final   Special Requests BOTTLES DRAWN AEROBIC AND ANAEROBIC 5.5CC  Final   Culture   Final    NO GROWTH 1 DAY Performed at [facility]    Report Status PENDING  Incomplete  Culture, blood (single)     Status: None (Preliminary result)   Collection Time: 04/13/16  2:30 PM  Result Value Ref Range Status   Specimen Description BLOOD RIGHT ARM  Final   Special Requests BOTTLES DRAWN AEROBIC AND ANAEROBIC 5CC  Final   Culture   Final    NO GROWTH < 24 HOURS Performed at [facility]    Report Status PENDING  Incomplete    Radiology Reports Dg Chest 2 View  Result Date: 04/08/2016 CLINICAL DATA:  Epigastric abdominal pain. EXAM: CHEST  2 VIEW COMPARISON:  Radiograph of February 20, 2016. FINDINGS: Stable cardiomediastinal silhouette. Atherosclerosis of thoracic aorta is noted. Sternotomy wires are noted. No pneumothorax is noted. Minimal  right basilar subsegmental atelectasis is noted. Mild left basilar atelectasis or infiltrate is noted with mild left pleural effusion. Bony thorax is unremarkable. IMPRESSION: Aortic atherosclerosis. Mild left basilar atelectasis or infiltrate is noted with mild left pleural effusion. Electronically Signed  By: Marijo Conception, M.D.   On: 04/08/2016 15:24   Ct Abdomen Pelvis W Contrast  Result Date: 04/08/2016 CLINICAL DATA:  80 year old male with prolonged hospitalization and rehab this summer. Acute abdominal pain today. Initial encounter. EXAM: CT ABDOMEN AND PELVIS WITH CONTRAST TECHNIQUE: Multidetector CT imaging of the abdomen and pelvis was performed using the standard protocol following bolus administration of intravenous contrast. CONTRAST:  13mL ISOVUE-300 IOPAMIDOL (ISOVUE-300) INJECTION 61% COMPARISON:  Noncontrast CT Abdomen and Pelvis 08/30/2015 and earlier. CTA abdomen and pelvis 05/18/2012. FINDINGS: Lower chest: Small to moderate layering left and small layering right pleural effusions are new since February. Associated compressive atelectasis at both lung bases. There are some air bronchograms in the right lower lobe. There is also mild consolidation in the lingula with some air bronchograms. Cardiomegaly. No pericardial effusion. Extensive coronary artery calcified atherosclerosis status post CABG. No upper abdominal free air. Hepatobiliary: Stable liver with multiple benign-appearing low-density areas. Chronic absence of the gallbladder. Intra and extrahepatic biliary ducts appear stable. Pancreas: Somewhat atrophied and stable. Spleen: Negative. Adrenals/Urinary Tract: Negative adrenal glands. A mild degree of bilateral renal atrophy is noted. Renal contrast enhancement and excretion is within normal limits. Diminutive urinary bladder which may explain the appearance of bladder wall thickening. Stomach/Bowel: Diffusely abnormal large bowel wall thickening. Much of the inflamed colon is  also decompressed. Mucosa seems to be hyperenhancing throughout much of the affected segments. Involvement from the cecum to the rectum is noted. Numerous fluid-filled small bowel loops, but no definite dilated small bowel. Decompressed stomach and duodenum. Superimposed diffuse mesenteric haziness suggesting mesenteric edema, but this may be secondary to anasarca described below. Vascular/Lymphatic: Severe diffuse calcified atherosclerosis of the aorta, all of its major branches, the pelvic arteries and proximal femoral arteries. No definite major arterial branch occlusion is identified. No definite lymphadenopathy. Reproductive: Negative. Other: Diffuse subcutaneous fat stranding in keeping with anasarca. Small volume of pelvic free fluid about the rectum.7 there appears to be a a fluid containing right femoral hernia which is new (series 3, image 87). Musculoskeletal: No acute osseous abnormality identified. IMPRESSION: 1. Diffuse acute colitis. Favor infectious colitis and consider C. difficile colitis. 2. Superimposed anasarca. Moderate layering left and small layering right pleural effusions. Associated lung base atelectasis but difficult to exclude developing pneumonia in the lingula and at the right lung base. 3. Chronic severe diffuse calcified atherosclerosis throughout the abdomen and pelvis. No definite large vessel occlusion. 4. Small fluid containing right femoral hernia suspected and new since February. Electronically Signed   By: Genevie Ann M.D.   On: 04/08/2016 16:00   Ir Removal Tun Cv Cath W/o Fl  Result Date: 03/31/2016 CLINICAL DATA:  Tunneled IJ PICC placed by IR 02/02/2016 for antibiotic treatment. Worked well, without complication. Removal requested. EXAM: TUNNELED CENTRAL VENOUS CATHETER REMOVAL TECHNIQUE: Overlying skin prepped with chlorhexidine, draped in usual sterile fashion. The previously placed tunneled left IJ central venous catheter was dissected free from the underlying soft  tissues and removed intact. Hemostasis was achieved. Site covered with a sterile dressing. The patient tolerated the procedure well. COMPLICATIONS: none IMPRESSION: 1. Technically successful tunneled left IJ central venous catheter removal. Electronically Signed   By: Lucrezia Europe M.D.   On: 03/31/2016 11:08    Time Spent in minutes  25   Louellen Molder M.D on 04/15/2016 at 12:58 PM  Between 7am to 7pm - Pager - [phone]  After 7pm go to [email or web address] - password [email or web address]  [facility] -  Office  [phone]     

## 2016-04-15 NOTE — Progress Notes (Signed)
[place] pt for [facility] this admission  [facility] will provide [facility] services for home IV ABX. [facility] hospital team will follow pt to support transition home and ensure [facility] needs are met.   If patient discharges after hours, please call [phone].   [name] Sierras 04/15/2016, 11:35 AM

## 2016-04-16 ENCOUNTER — Other Ambulatory Visit: Payer: Self-pay | Admitting: Hematology and Oncology

## 2016-04-16 DIAGNOSIS — I5032 Chronic diastolic (congestive) heart failure: Secondary | ICD-10-CM

## 2016-04-16 DIAGNOSIS — D509 Iron deficiency anemia, unspecified: Secondary | ICD-10-CM | POA: Diagnosis present

## 2016-04-16 DIAGNOSIS — D508 Other iron deficiency anemias: Secondary | ICD-10-CM

## 2016-04-16 LAB — BASIC METABOLIC PANEL
Anion gap: 7 (ref 5–15)
BUN: 29 mg/dL — ABNORMAL HIGH (ref 6–20)
CHLORIDE: 107 mmol/L (ref 101–111)
CO2: 23 mmol/L (ref 22–32)
Calcium: 8.7 mg/dL — ABNORMAL LOW (ref 8.9–10.3)
Creatinine, Ser: 1.66 mg/dL — ABNORMAL HIGH (ref 0.61–1.24)
GFR, EST AFRICAN AMERICAN: 44 mL/min — AB (ref >60)
GFR, EST NON AFRICAN AMERICAN: 38 mL/min — AB (ref >60)
Glucose, Bld: 155 mg/dL — ABNORMAL HIGH (ref 65–99)
POTASSIUM: 4 mmol/L (ref 3.5–5.1)
SODIUM: 137 mmol/L (ref 135–145)

## 2016-04-16 LAB — GLUCOSE, CAPILLARY
GLUCOSE-CAPILLARY: 106 mg/dL — AB (ref 65–99)
GLUCOSE-CAPILLARY: 151 mg/dL — AB (ref 65–99)
GLUCOSE-CAPILLARY: 191 mg/dL — AB (ref 65–99)
GLUCOSE-CAPILLARY: 210 mg/dL — AB (ref 65–99)
GLUCOSE-CAPILLARY: 240 mg/dL — AB (ref 65–99)

## 2016-04-16 MED ORDER — CEFAZOLIN IN D5W 1 GM/50ML IV SOLN: 1.0000 g | Freq: Two times a day (BID) | INTRAVENOUS | 0 refills | Status: DC

## 2016-04-16 MED ORDER — DARBEPOETIN ALFA 200 MCG/0.4ML IJ SOSY
200.0000 ug | PREFILLED_SYRINGE | Freq: Once | INTRAMUSCULAR | Status: AC
  Administered 2016-04-16: 200 ug via SUBCUTANEOUS
  Filled 2016-04-16: qty 0.4

## 2016-04-16 MED ORDER — CARVEDILOL 25 MG PO TABS: 25.0000 mg | ORAL_TABLET | Freq: Two times a day (BID) | ORAL | 0 refills | Status: DC

## 2016-04-16 MED ORDER — PREDNISONE 20 MG PO TABS: 40.0000 mg | ORAL_TABLET | Freq: Every day | ORAL | 0 refills | Status: DC

## 2016-04-16 MED ORDER — SODIUM CHLORIDE 0.9% FLUSH: 10.0000 mL | Freq: Two times a day (BID) | INTRAVENOUS | Status: DC

## 2016-04-16 MED ORDER — VANCOMYCIN 50 MG/ML ORAL SOLUTION: 125.0000 mg | Freq: Four times a day (QID) | ORAL | 0 refills | Status: AC

## 2016-04-16 MED ORDER — SODIUM CHLORIDE 0.9% FLUSH: 10.0000 mL | INTRAVENOUS | Status: DC | PRN

## 2016-04-16 MED ORDER — PREDNISONE 20 MG PO TABS: 40.0000 mg | ORAL_TABLET | Freq: Every day | ORAL | 0 refills | Status: AC

## 2016-04-16 NOTE — Progress Notes (Signed)
Peripherally Inserted Central Catheter/Midline Placement  The IV Nurse has discussed with the patient and/or persons authorized to consent for the patient, the purpose of this procedure and the potential benefits and risks involved with this procedure.  The benefits include less needle sticks, lab draws from the catheter, and the patient may be discharged home with the catheter. Risks include, but not limited to, infection, bleeding, blood clot (thrombus formation), and puncture of an artery; nerve damage and irregular heartbeat and possibility to perform a PICC exchange if needed/ordered by physician.  Alternatives to this procedure were also discussed.  B[employer] Power PICC patient education guide, fact sheet on infection prevention and patient information card has been provided to patient /or left at bedside.    PICC/Midline Placement Documentation  PICC Single Lumen 04/16/16 PICC Right Brachial 43 cm 0.2 cm (Active)  Indication for Insertion or Continuance of Line Home intravenous therapies (PICC only) 04/16/2016  7:57 AM  Exposed Catheter (cm) 2 cm 04/16/2016  7:57 AM  Site Assessment Clean;Dry;Intact 04/16/2016  7:57 AM  Line Status Flushed;Saline locked;Blood return noted 04/16/2016  7:57 AM  Dressing Type Transparent 04/16/2016  7:57 AM  Dressing Status Clean;Dry;Intact 04/16/2016  7:57 AM  Dressing Change Due 04/23/16 04/16/2016  7:57 AM       Gordan Payment 04/16/2016, 7:59 AM

## 2016-04-16 NOTE — Progress Notes (Signed)
Patient and his family given discharge, follow up, and medication instructions, verbalized understanding, IV and telemetry removed, prescriptions with pt's wife, discharging home with PICC line for IV ABT, family to transport home

## 2016-04-16 NOTE — Discharge Summary (Addendum)
Physician Discharge Summary  Teak Baka [id number] DOB: May 13, 1936 DOA: 04/08/2016  PCP: [name], MD  Admit date: 04/08/2016 Discharge date: 04/16/2016  Admitted From: Home Disposition:  Home  Recommendations for Outpatient Follow-up:  1. Follow up with PCP in 1-2 weeks. 2. Patient will complete 14 days of treatment for C. difficile (vancomycin) on 04/24/2016. 3. Patient is discharged on IV Ancef for 6 weeks duration through a  right upper extremity PICC line. Patient will complete 6 weeks of IV Ancef on 05/26/2016. Home health instructed to check CBC and BMP at least once a week and send results to Dr. Algis Downs office while patient on IV antibiotics. 4. Patient should follow-up with Dr. Johnnye Sima after completion of antibiotics.  Home Health: RN and PT Equipment/Devices: None  Discharge Condition: Fair CODE STATUS: Full code Diet recommendation: Heart Healthy / Carb Modified     Discharge Diagnoses:  Principal Problem:   Endocarditis of tricuspid valve   Active Problems:   PAF (paroxysmal atrial fibrillation) (HCC)   Chronic diastolic heart failure (HCC)   Diabetes mellitus (HCC)   Essential hypertension   Thrombocytopenia (HCC)   C. difficile colitis   Anemia, iron deficiency   Chronic kidney disease stage III   Hypokalemia   Brief narrative/history of present illness 80 year old male with history of A. fib on aspirin and amiodarone, CAD status post CABG, history of C. 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 Patient presented to the ED with abdominal pain, poor appetite and diarrhea with associated shortness of breath since 2 days prior to admission. Patient had hypertensive urgency on presentation (237/97  mmHg), mild leukocytosis (12.3) and mildly elevated troponin (0.07) EKG showed inverted T waves in lateral leads. Denied chest pain symptoms. CT abdomen showed diffuse acute colitis favoring infection. Patient started on empiric Cipro and Flagyl. Stool for C. difficile was positive and she is to oral vancomycin. 2-D echo done given mild elevated troponin showed a possible tricuspid vegetation. This was followed by a TEE which showed a tricuspid vegetation.   Hospital course Principal problem Infective endocarditis with tricuspid valve vegetation Confirmed on TEE. 3 sets of blood cultures negative for growth.  Appreciate ID consult. Suspected endocarditis from prior bacteremia/endocarditis. -Thoracic surgery consulted who recommend that he is not a candidate for surgical treatment given his, age  multiple co morbidities, prior CABG and calcified vessels. Recommend to continue antibiotics. -PICC line placed and planned for 6 weeks of IV antibiotics with Ancef per ID recommendations. Home health arranged.  Active Problems: Acute C. difficile colitis Abdominal pain and diarrhea resolved. Oral vancomycin for 2 weeks course. (Completes on 10/14)     PAF (paroxysmal atrial fibrillation) ([place]) CHADS2vasc of 6. On anticoagulation with aspirin. Continue Coreg and amiodarone.    Chronic diastolic heart failure (HCC) Has trace leg edema. Resume home dose Lasix and potassium supplement.  Acute gouty arthritis of left ankle  oral prednisone for 5 day course. Symptoms better today.    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 Resume sliding scale at home.     Essential hypertension BP persistently high. Increase coreg to 25 mg bid. continue  Imdur, hydralazine and Lasix at home dose.  CK D stage III Renal function at baseline.  Anemia of chronic kidney disease Receives aranesp  every 2 weeks as outpt. Follows with Dr Alvy Bimler. Will  receive her dose prior to discharge (recommended to keep hemoglobin greater than 10).   Hypokalemia Replenished      Family Communication  : None at bedside  Disposition Plan  : Home     Consults  :  ID (Dr. Johnnye Sima) Cardiothoracic surgery (Dr. Cyndia Bent)  Procedures  :  CT abdomen 2-D echo TEE   Discharge Instructions  Discharge Instructions    Call MD for:  difficulty breathing, headache or visual disturbances    Complete by:  As directed    Call MD for:  redness, tenderness, or signs of infection (pain, swelling, redness, odor or green/yellow discharge around incision site)    Complete by:  As directed    Call MD for:  severe uncontrolled pain    Complete by:  As directed    Diet - low sodium heart healthy    Complete by:  As directed    Discharge instructions    Complete by:  As directed       Increase activity slowly    Complete by:  As directed        Medication List    STOP taking these medications   pantoprazole 40 MG tablet Commonly known as:  PROTONIX     TAKE these medications   amiodarone 200 MG tablet Commonly known as:  PACERONE Take one-half tablet by  mouth daily   ARTHRITIS PAIN RELIEF 650 MG CR tablet Generic drug:  acetaminophen Take 650 mg by mouth 3 (three) times daily.   atorvastatin 80 MG tablet Commonly known as:  LIPITOR Take 80 mg by mouth at bedtime.   carvedilol 25 MG tablet Commonly known as:  COREG Take 1 tablet (25 mg total) by mouth 2 (two) times daily with a meal. Give a one time dose of 6.25 today at 1PM (03/31/16) What changed:  medication strength  how much to take   ceFAZolin 1 GM/50ML Commonly known as:  ANCEF Inject 50 mLs (1 g total) into the vein every 12 (twelve) hours.   cyanocobalamin 1000 MCG/ML injection Commonly known as:  (VITAMIN B-12) Inject 1,000 mcg into the muscle every 30 (thirty) days. Vitamin B12 - last injection 09/01/15   epoetin alfa 10000 UNIT/ML injection Commonly  known as:  EPOGEN,PROCRIT Inject 10,000 Units into the skin every 14 (fourteen) days. Done at [facility] - last injection 09/22/15   ferrous sulfate 325 (65 FE) MG tablet Take 1 tablet (325 mg total) by mouth 3 (three) times daily with meals.   furosemide 80 MG tablet Commonly known as:  LASIX Take 1 tablet (80 mg total) by mouth daily.   hydrALAZINE 25 MG tablet Commonly known as:  APRESOLINE Take 3 tablets (75 mg total) by mouth 4 (four) times daily.   isosorbide mononitrate 120 MG 24 hr tablet Commonly known as:  IMDUR Take 1 tablet by mouth  daily   leuprolide 30 MG injection Commonly known as:  LUPRON Inject 30 mg into the muscle every 4 (four) months.   multivitamin with minerals Tabs tablet Take 1 tablet by mouth every morning.   NOVOLOG 100 UNIT/ML injection Generic drug:  insulin aspart Inject 0-9 Units into the skin. SSI:  121-150 = 1 unit, 151-200 = 2 units, 201-250 = 3 units, 251-300 = 5 units, 301-350 = 7 units, 351-400 = 9 units   potassium chloride SA 20 MEQ tablet Commonly known as:  K-DUR,KLOR-CON Take 20-40 mEq by mouth  2 (two) times daily. Give 40 mEq BID and 20 mEq qhs   predniSONE 20 MG tablet Commonly known as:  DELTASONE Take 2 tablets (40 mg total) by mouth daily with breakfast. Start taking on:  04/17/2016   RAYALDEE 30 MCG Cpcr Generic drug:  Calcifediol ER Take 30 mcg by mouth at bedtime.   senna-docusate 8.6-50 MG tablet Commonly known as:  Senokot-S Take 2 tablets by mouth 2 (two) times daily.   silodosin 8 MG Caps capsule Commonly known as:  RAPAFLO Take 8 mg by mouth at bedtime.   sodium bicarbonate 650 MG tablet Take 1 tablet (650 mg total) by mouth 2 (two) times daily.   UNABLE TO FIND Med Name: Med pass 120 mL 2 times daily   vancomycin 50 mg/mL oral solution Commonly known as:  VANCOCIN Take 2.5 mLs (125 mg total) by mouth 4 (four) times daily.      Follow-up Information    [name], MD. Schedule an  appointment as soon as possible for a visit in 1 week(s).   Specialty:  Family Medicine Contact information: [place] [address] [phone]        Bobby Rumpf, MD. Schedule an appointment as soon as possible for a visit in 6 week(s).   Specialty:  Infectious Diseases Contact information: [address] [place] [address] [phone]          Allergies  Allergen Reactions  . Nsaids Nausea Only    GI issue  . Ibuprofen Other (See Comments)    GI Issues  . Ace Inhibitors Cough        Procedures/Studies: Dg Chest 2 View  Result Date: 04/08/2016 CLINICAL DATA:  Epigastric abdominal pain. EXAM: CHEST  2 VIEW COMPARISON:  Radiograph of February 20, 2016. FINDINGS: Stable cardiomediastinal silhouette. Atherosclerosis of thoracic aorta is noted. Sternotomy wires are noted. No pneumothorax is noted. Minimal right basilar subsegmental atelectasis is noted. Mild left basilar atelectasis or infiltrate is noted with mild left pleural effusion. Bony thorax is unremarkable. IMPRESSION: Aortic atherosclerosis. Mild left basilar atelectasis or infiltrate is noted with mild left pleural effusion. Electronically Signed   By: Marijo Conception, M.D.   On: 04/08/2016 15:24   Ct Abdomen Pelvis W Contrast  Result Date: 04/08/2016 CLINICAL DATA:  80 year old male with prolonged hospitalization and rehab this summer. Acute abdominal pain today. Initial encounter. EXAM: CT ABDOMEN AND PELVIS WITH CONTRAST TECHNIQUE: Multidetector CT imaging of the abdomen and pelvis was performed using the standard protocol following bolus administration of intravenous contrast. CONTRAST:  129mL ISOVUE-300 IOPAMIDOL (ISOVUE-300) INJECTION 61% COMPARISON:  Noncontrast CT Abdomen and Pelvis 08/30/2015 and earlier. CTA abdomen and pelvis 05/18/2012. FINDINGS: Lower chest: Small to moderate layering left and small layering right pleural effusions are new since February. Associated  compressive atelectasis at both lung bases. There are some air bronchograms in the right lower lobe. There is also mild consolidation in the lingula with some air bronchograms. Cardiomegaly. No pericardial effusion. Extensive coronary artery calcified atherosclerosis status post CABG. No upper abdominal free air. Hepatobiliary: Stable liver with multiple benign-appearing low-density areas. Chronic absence of the gallbladder. Intra and extrahepatic biliary ducts appear stable. Pancreas: Somewhat atrophied and stable. Spleen: Negative. Adrenals/Urinary Tract: Negative adrenal glands. A mild degree of bilateral renal atrophy is noted. Renal contrast enhancement and excretion is within normal limits. Diminutive urinary bladder which may explain the appearance of bladder wall thickening. Stomach/Bowel: Diffusely abnormal large bowel wall thickening. Much of the  inflamed colon is also decompressed. Mucosa seems to be hyperenhancing throughout much of the affected segments. Involvement from the cecum to the rectum is noted. Numerous fluid-filled small bowel loops, but no definite dilated small bowel. Decompressed stomach and duodenum. Superimposed diffuse mesenteric haziness suggesting mesenteric edema, but this may be secondary to anasarca described below. Vascular/Lymphatic: Severe diffuse calcified atherosclerosis of the aorta, all of its major branches, the pelvic arteries and proximal femoral arteries. No definite major arterial branch occlusion is identified. No definite lymphadenopathy. Reproductive: Negative. Other: Diffuse subcutaneous fat stranding in keeping with anasarca. Small volume of pelvic free fluid about the rectum.7 there appears to be a a fluid containing right femoral hernia which is new (series 3, image 87). Musculoskeletal: No acute osseous abnormality identified. IMPRESSION: 1. Diffuse acute colitis. Favor infectious colitis and consider C. difficile colitis. 2. Superimposed anasarca. Moderate  layering left and small layering right pleural effusions. Associated lung base atelectasis but difficult to exclude developing pneumonia in the lingula and at the right lung base. 3. Chronic severe diffuse calcified atherosclerosis throughout the abdomen and pelvis. No definite large vessel occlusion. 4. Small fluid containing right femoral hernia suspected and new since February. Electronically Signed   By: Genevie Ann M.D.   On: 04/08/2016 16:00   Ir Removal Tun Cv Cath W/o Fl  Result Date: 03/31/2016 CLINICAL DATA:  Tunneled IJ PICC placed by IR 02/02/2016 for antibiotic treatment. Worked well, without complication. Removal requested. EXAM: TUNNELED CENTRAL VENOUS CATHETER REMOVAL TECHNIQUE: Overlying skin prepped with chlorhexidine, draped in usual sterile fashion. The previously placed tunneled left IJ central venous catheter was dissected free from the underlying soft tissues and removed intact. Hemostasis was achieved. Site covered with a sterile dressing. The patient tolerated the procedure well. COMPLICATIONS: none IMPRESSION: 1. Technically successful tunneled left IJ central venous catheter removal. Electronically Signed   By: Lucrezia Europe M.D.   On: 03/31/2016 11:08    TEE (04/12/2016)  Study Conclusions  - Left ventricle: Wall thickness was increased in a pattern of   moderate LVH. Systolic function was normal. The estimated   ejection fraction was in the range of 55% to 60%. Wall motion was   normal; there were no regional wall motion abnormalities. - Aortic valve: There was trivial regurgitation. - Aorta: Severe non-mobile atherosclerotic plaque. - Ascending aorta: The ascending aorta was normal in size. - Mitral valve: There was mild regurgitation. - Left atrium: The atrium was dilated. No evidence of thrombus in   the atrial cavity or appendage. - Right ventricle: Systolic function was normal. - Right atrium: No evidence of thrombus in the atrial cavity or   appendage. - Atrial  septum: No defect or patent foramen ovale was identified. - Tricuspid valve: There was mild-moderate regurgitation.  Impressions:  - Tricuspid valve is significantly thickened, predominantly the   posterior leaflet. There is a mobile echodensity measuring 1.6 x   1.5 cm attached to the posterior leaflet suspicious for a   vegetation.   Subjective: No overnight issues. No further diarrhea. Denies shortness of breath. Left ankle pain better.  Discharge Exam: Vitals:   04/16/16 0415 04/16/16 0843  BP: (!) 155/66 (!) 160/67  Pulse: 69 70  Resp: 18   Temp: 98.1 F (36.7 C)    Vitals:   04/15/16 0520 04/15/16 2124 04/16/16 0415 04/16/16 0843  BP: (!) 170/65 (!) 158/68 (!) 155/66 (!) 160/67  Pulse: 81 72 69 70  Resp: 16 16 18    Temp: 98.5 F (36.9  C) 98.8 F (37.1 C) 98.1 F (36.7 C)   TempSrc: Oral Oral Oral   SpO2: 99% 100% 100%   Weight:      Height:       Gen: not in distress HEENT:moist mucosa, supple neck Chest: clear b/l, no added sounds CVS: N S1&S2, no murmurs, rubs or gallop GI: soft, NT, ND, BS+ Musculoskeletal: warm, trace edema bilaterally, left ankle tenderness improved     The results of significant diagnostics from this hospitalization (including imaging, microbiology, ancillary and laboratory) are listed below for reference.     Microbiology: Recent Results (from the past 240 hour(s))  MRSA PCR Screening     Status: Abnormal   Collection Time: 04/08/16 12:10 AM  Result Value Ref Range Status   MRSA by PCR POSITIVE (A) NEGATIVE Final    Comment:        The GeneXpert MRSA Assay (FDA approved for NASAL specimens only), is one component of a comprehensive MRSA colonization surveillance program. It is not intended to diagnose MRSA infection nor to guide or monitor treatment for MRSA infections. RESULT CALLED TO, READ BACK BY AND VERIFIED WITH: SEARS RN AT 0800 ON 09.29.17 BY SHUEA   Urine culture     Status: Abnormal   Collection Time:  04/08/16  2:49 PM  Result Value Ref Range Status   Specimen Description URINE, CLEAN CATCH  Final   Special Requests NONE  Final   Culture MULTIPLE SPECIES PRESENT, SUGGEST RECOLLECTION (A)  Final   Report Status 04/10/2016 FINAL  Final  C difficile quick scan w PCR reflex     Status: Abnormal   Collection Time: 04/08/16  4:40 PM  Result Value Ref Range Status   C Diff antigen POSITIVE (A) NEGATIVE Final   C Diff toxin POSITIVE (A) NEGATIVE Final   C Diff interpretation Toxin producing C. difficile detected.  Final    Comment: CRITICAL RESULT CALLED TO, READ BACK BY AND VERIFIED WITH: S.SEAGRAVES RN 2148 [id number] A.[email or web address]   Culture, blood (single)     Status: None (Preliminary result)   Collection Time: 04/13/16  9:37 AM  Result Value Ref Range Status   Specimen Description BLOOD RIGHT ARM  Final   Special Requests BOTTLES DRAWN AEROBIC AND ANAEROBIC 5.5CC  Final   Culture   Final    NO GROWTH 2 DAYS Performed at [facility]    Report Status PENDING  Incomplete  Culture, blood (single)     Status: None (Preliminary result)   Collection Time: 04/13/16  2:30 PM  Result Value Ref Range Status   Specimen Description BLOOD RIGHT ARM  Final   Special Requests BOTTLES DRAWN AEROBIC AND ANAEROBIC 5CC  Final   Culture   Final    NO GROWTH 2 DAYS Performed at [facility]    Report Status PENDING  Incomplete  Culture, blood (single)     Status: None (Preliminary result)   Collection Time: 04/14/16  5:38 AM  Result Value Ref Range Status   Specimen Description BLOOD RIGHT ARM  Final   Special Requests BOTTLES DRAWN AEROBIC AND ANAEROBIC 5CC  Final   Culture   Final    NO GROWTH 1 DAY Performed at [facility]    Report Status PENDING  Incomplete     Labs: BNP (last 3 results) No results for input(s): BNP in the last 8760 hours. Basic Metabolic Panel:  Recent Labs Lab 04/10/16 0527 04/14/16 0538 04/15/16 0351 04/16/16 0417  NA 138 138 136  137  K 3.5  3.5 3.5 4.0  CL 111 108 109 107  CO2 23 23 23 23   GLUCOSE 80 99 107* 155*  BUN 25* 32* 30* 29*  CREATININE 2.10* 1.85* 1.79* 1.66*  CALCIUM 8.1* 8.7* 8.5* 8.7*   Liver Function Tests: No results for input(s): AST, ALT, ALKPHOS, BILITOT, PROT, ALBUMIN in the last 168 hours. No results for input(s): LIPASE, AMYLASE in the last 168 hours. No results for input(s): AMMONIA in the last 168 hours. CBC:  Recent Labs Lab 04/10/16 0527 04/11/16 0528 04/14/16 0538 04/15/16 0351  WBC 7.2 6.5 6.0 6.8  HGB 8.2* 8.8* 9.4* 9.1*  HCT 26.6* 27.8* 29.4* 28.2*  MCV 94.3 96.2 95.5 94.9  PLT 129* 132* 152 162   Cardiac Enzymes: No results for input(s): CKTOTAL, CKMB, CKMBINDEX, TROPONINI in the last 168 hours. BNP: Invalid input(s): POCBNP CBG:  Recent Labs Lab 04/15/16 1702 04/15/16 2102 04/16/16 0022 04/16/16 0409 04/16/16 0820  GLUCAP 148* 240* 210* 151* 106*   D-Dimer No results for input(s): DDIMER in the last 72 hours. Hgb A1c No results for input(s): HGBA1C in the last 72 hours. Lipid Profile No results for input(s): CHOL, HDL, LDLCALC, TRIG, CHOLHDL, LDLDIRECT in the last 72 hours. Thyroid function studies No results for input(s): TSH, T4TOTAL, T3FREE, THYROIDAB in the last 72 hours.  Invalid input(s): FREET3 Anemia work up No results for input(s): VITAMINB12, FOLATE, FERRITIN, TIBC, IRON, RETICCTPCT in the last 72 hours. Urinalysis    Component Value Date/Time   COLORURINE YELLOW 04/08/2016 1449   APPEARANCEUR CLOUDY (A) 04/08/2016 1449   LABSPEC 1.016 04/08/2016 1449   LABSPEC 1.010 05/19/2015 1231   PHURINE 5.0 04/08/2016 1449   GLUCOSEU NEGATIVE 04/08/2016 1449   GLUCOSEU Negative 05/19/2015 1231   HGBUR SMALL (A) 04/08/2016 1449   BILIRUBINUR NEGATIVE 04/08/2016 1449   BILIRUBINUR Negative 05/19/2015 1231   KETONESUR NEGATIVE 04/08/2016 1449   PROTEINUR 30 (A) 04/08/2016 1449   UROBILINOGEN 0.2 05/19/2015 1231   NITRITE NEGATIVE 04/08/2016 1449    LEUKOCYTESUR LARGE (A) 04/08/2016 1449   LEUKOCYTESUR Negative 05/19/2015 1231   Sepsis Labs Invalid input(s): PROCALCITONIN,  WBC,  LACTICIDVEN Microbiology Recent Results (from the past 240 hour(s))  MRSA PCR Screening     Status: Abnormal   Collection Time: 04/08/16 12:10 AM  Result Value Ref Range Status   MRSA by PCR POSITIVE (A) NEGATIVE Final    Comment:        The GeneXpert MRSA Assay (FDA approved for NASAL specimens only), is one component of a comprehensive MRSA colonization surveillance program. It is not intended to diagnose MRSA infection nor to guide or monitor treatment for MRSA infections. RESULT CALLED TO, READ BACK BY AND VERIFIED WITH: SEARS RN AT 0800 ON 09.29.17 BY SHUEA   Urine culture     Status: Abnormal   Collection Time: 04/08/16  2:49 PM  Result Value Ref Range Status   Specimen Description URINE, CLEAN CATCH  Final   Special Requests NONE  Final   Culture MULTIPLE SPECIES PRESENT, SUGGEST RECOLLECTION (A)  Final   Report Status 04/10/2016 FINAL  Final  C difficile quick scan w PCR reflex     Status: Abnormal   Collection Time: 04/08/16  4:40 PM  Result Value Ref Range Status   C Diff antigen POSITIVE (A) NEGATIVE Final   C Diff toxin POSITIVE (A) NEGATIVE Final   C Diff interpretation Toxin producing C. difficile detected.  Final    Comment: CRITICAL RESULT CALLED TO,  READ BACK BY AND VERIFIED WITH: S.SEAGRAVES RN 2148 [id number] A.QUIZON   Culture, blood (single)     Status: None (Preliminary result)   Collection Time: 04/13/16  9:37 AM  Result Value Ref Range Status   Specimen Description BLOOD RIGHT ARM  Final   Special Requests BOTTLES DRAWN AEROBIC AND ANAEROBIC 5.5CC  Final   Culture   Final    NO GROWTH 2 DAYS Performed at [facility]    Report Status PENDING  Incomplete  Culture, blood (single)     Status: None (Preliminary result)   Collection Time: 04/13/16  2:30 PM  Result Value Ref Range Status   Specimen Description  BLOOD RIGHT ARM  Final   Special Requests BOTTLES DRAWN AEROBIC AND ANAEROBIC 5CC  Final   Culture   Final    NO GROWTH 2 DAYS Performed at [facility]    Report Status PENDING  Incomplete  Culture, blood (single)     Status: None (Preliminary result)   Collection Time: 04/14/16  5:38 AM  Result Value Ref Range Status   Specimen Description BLOOD RIGHT ARM  Final   Special Requests BOTTLES DRAWN AEROBIC AND ANAEROBIC 5CC  Final   Culture   Final    NO GROWTH 1 DAY Performed at [facility]    Report Status PENDING  Incomplete     Time coordinating discharge: Over 30 minutes  SIGNED:   Louellen Molder, MD  [facility] 04/16/2016, 11:35 AM Pager   If 7PM-7AM, please contact night-coverage [email or web address] Password [email or web address]

## 2016-04-18 LAB — CULTURE, BLOOD (SINGLE)
CULTURE: NO GROWTH
Culture: NO GROWTH

## 2016-04-19 ENCOUNTER — Ambulatory Visit (HOSPITAL_BASED_OUTPATIENT_CLINIC_OR_DEPARTMENT_OTHER): Payer: Medicare Other

## 2016-04-19 ENCOUNTER — Other Ambulatory Visit (HOSPITAL_BASED_OUTPATIENT_CLINIC_OR_DEPARTMENT_OTHER): Payer: Medicare Other

## 2016-04-19 VITALS — BP 155/66 | HR 72 | Temp 98.2°F | Resp 18

## 2016-04-19 DIAGNOSIS — D469 Myelodysplastic syndrome, unspecified: Secondary | ICD-10-CM

## 2016-04-19 DIAGNOSIS — N183 Chronic kidney disease, stage 3 (moderate): Secondary | ICD-10-CM

## 2016-04-19 DIAGNOSIS — Z452 Encounter for adjustment and management of vascular access device: Secondary | ICD-10-CM | POA: Diagnosis not present

## 2016-04-19 LAB — CBC & DIFF AND RETIC
BASO%: 0.3 % (ref 0.0–2.0)
Basophils Absolute: 0 10*3/uL (ref 0.0–0.1)
EOS%: 1.1 % (ref 0.0–7.0)
Eosinophils Absolute: 0.1 10*3/uL (ref 0.0–0.5)
HCT: 30.9 % — ABNORMAL LOW (ref 38.4–49.9)
HGB: 10 g/dL — ABNORMAL LOW (ref 13.0–17.1)
IMMATURE RETIC FRACT: 6.2 % (ref 3.00–10.60)
LYMPH%: 16.5 % (ref 14.0–49.0)
MCH: 30.2 pg (ref 27.2–33.4)
MCHC: 32.4 g/dL (ref 32.0–36.0)
MCV: 93.4 fL (ref 79.3–98.0)
MONO#: 0.5 10*3/uL (ref 0.1–0.9)
MONO%: 6.9 % (ref 0.0–14.0)
NEUT%: 75.2 % — ABNORMAL HIGH (ref 39.0–75.0)
NEUTROS ABS: 5.3 10*3/uL (ref 1.5–6.5)
Platelets: 173 10*3/uL (ref 140–400)
RBC: 3.31 10*6/uL — AB (ref 4.20–5.82)
RDW: 19.7 % — ABNORMAL HIGH (ref 11.0–14.6)
RETIC %: 1.27 % (ref 0.80–1.80)
Retic Ct Abs: 42.04 10*3/uL (ref 34.80–93.90)
WBC: 7.1 10*3/uL (ref 4.0–10.3)
lymph#: 1.2 10*3/uL (ref 0.9–3.3)

## 2016-04-19 LAB — CULTURE, BLOOD (SINGLE): Culture: NO GROWTH

## 2016-04-19 MED ORDER — DARBEPOETIN ALFA 200 MCG/0.4ML IJ SOSY
200.0000 ug | PREFILLED_SYRINGE | Freq: Once | INTRAMUSCULAR | Status: AC
  Administered 2016-04-19: 200 ug via SUBCUTANEOUS
  Filled 2016-04-19: qty 0.4

## 2016-04-19 MED ORDER — SODIUM CHLORIDE 0.9% FLUSH
10.0000 mL | INTRAVENOUS | Status: DC | PRN
  Administered 2016-04-19: 10 mL via INTRAVENOUS
  Filled 2016-04-19: qty 10

## 2016-04-19 MED ORDER — HEPARIN SOD (PORK) LOCK FLUSH 100 UNIT/ML IV SOLN
500.0000 [IU] | Freq: Once | INTRAVENOUS | Status: AC
  Administered 2016-04-19: 500 [IU] via INTRAVENOUS
  Filled 2016-04-19: qty 5

## 2016-04-19 NOTE — Patient Instructions (Signed)
Darbepoetin Alfa injection What is this medicine? DARBEPOETIN ALFA (dar be POE e tin AL fa) helps your body make more red blood cells. It is used to treat anemia caused by chronic kidney failure and chemotherapy. This medicine may be used for other purposes; ask your health care provider or pharmacist if you have questions. What should I tell my health care provider before I take this medicine? They need to know if you have any of these conditions: -blood clotting disorders or history of blood clots -cancer patient not on chemotherapy -cystic fibrosis -heart disease, such as angina, heart failure, or a history of a heart attack -hemoglobin level of 12 g/dL or greater -high blood pressure -low levels of folate, iron, or vitamin B12 -seizures -an unusual or allergic reaction to darbepoetin, erythropoietin, albumin, hamster proteins, latex, other medicines, foods, dyes, or preservatives -pregnant or trying to get pregnant -breast-feeding How should I use this medicine? This medicine is for injection into a vein or under the skin. It is usually given by a health care professional in a hospital or clinic setting. If you get this medicine at home, you will be taught how to prepare and give this medicine. Do not shake the solution before you withdraw a dose. Use exactly as directed. Take your medicine at regular intervals. Do not take your medicine more often than directed. It is important that you put your used needles and syringes in a special sharps container. Do not put them in a trash can. If you do not have a sharps container, call your pharmacist or healthcare provider to get one. Talk to your pediatrician regarding the use of this medicine in children. While this medicine may be used in children as young as 1 year for selected conditions, precautions do apply. Overdosage: If you think you have taken too much of this medicine contact a poison control center or emergency room at once. NOTE:  This medicine is only for you. Do not share this medicine with others. What if I miss a dose? If you miss a dose, take it as soon as you can. If it is almost time for your next dose, take only that dose. Do not take double or extra doses. What may interact with this medicine? Do not take this medicine with any of the following medications: -epoetin alfa This list may not describe all possible interactions. Give your health care provider a list of all the medicines, herbs, non-prescription drugs, or dietary supplements you use. Also tell them if you smoke, drink alcohol, or use illegal drugs. Some items may interact with your medicine. What should I watch for while using this medicine? Visit your prescriber or health care professional for regular checks on your progress and for the needed blood tests and blood pressure measurements. It is especially important for the doctor to make sure your hemoglobin level is in the desired range, to limit the risk of potential side effects and to give you the best benefit. Keep all appointments for any recommended tests. Check your blood pressure as directed. Ask your doctor what your blood pressure should be and when you should contact him or her. As your body makes more red blood cells, you may need to take iron, folic acid, or vitamin B supplements. Ask your doctor or health care provider which products are right for you. If you have kidney disease continue dietary restrictions, even though this medication can make you feel better. Talk with your doctor or health care professional about the   foods you eat and the vitamins that you take. What side effects may I notice from receiving this medicine? Side effects that you should report to your doctor or health care professional as soon as possible: -allergic reactions like skin rash, itching or hives, swelling of the face, lips, or tongue -breathing problems -changes in vision -chest pain -confusion, trouble speaking  or understanding -feeling faint or lightheaded, falls -high blood pressure -muscle aches or pains -pain, swelling, warmth in the leg -rapid weight gain -severe headaches -sudden numbness or weakness of the face, arm or leg -trouble walking, dizziness, loss of balance or coordination -seizures (convulsions) -swelling of the ankles, feet, hands -unusually weak or tired Side effects that usually do not require medical attention (report to your doctor or health care professional if they continue or are bothersome): -diarrhea -fever, chills (flu-like symptoms) -headaches -nausea, vomiting -redness, stinging, or swelling at site where injected This list may not describe all possible side effects. Call your doctor for medical advice about side effects. You may report side effects to FDA at 1-800-FDA-1088. Where should I keep my medicine? Keep out of the reach of children. Store in a refrigerator between 2 and 8 degrees C (36 and 46 degrees F). Do not freeze. Do not shake. Throw away any unused portion if using a single-dose vial. Throw away any unused medicine after the expiration date. NOTE: This sheet is a summary. It may not cover all possible information. If you have questions about this medicine, talk to your doctor, pharmacist, or health care provider.    2016, Elsevier/Gold Standard. (2008-06-11 10:23:57)  

## 2016-04-20 ENCOUNTER — Encounter: Payer: Self-pay | Admitting: Infectious Diseases

## 2016-04-24 ENCOUNTER — Emergency Department (HOSPITAL_COMMUNITY): Payer: Medicare Other

## 2016-04-24 ENCOUNTER — Encounter (HOSPITAL_COMMUNITY): Payer: Self-pay

## 2016-04-24 ENCOUNTER — Emergency Department (HOSPITAL_COMMUNITY)
Admission: EM | Admit: 2016-04-24 | Discharge: 2016-04-24 | Disposition: A | Discharge status: Home / Self Care | Payer: Medicare Other | Attending: Emergency Medicine | Admitting: Emergency Medicine

## 2016-04-24 DIAGNOSIS — R0600 Dyspnea, unspecified: Secondary | ICD-10-CM | POA: Insufficient documentation

## 2016-04-24 DIAGNOSIS — E119 Type 2 diabetes mellitus without complications: Secondary | ICD-10-CM | POA: Diagnosis not present

## 2016-04-24 DIAGNOSIS — Z79899 Other long term (current) drug therapy: Secondary | ICD-10-CM | POA: Insufficient documentation

## 2016-04-24 DIAGNOSIS — I2510 Atherosclerotic heart disease of native coronary artery without angina pectoris: Secondary | ICD-10-CM | POA: Diagnosis not present

## 2016-04-24 DIAGNOSIS — N184 Chronic kidney disease, stage 4 (severe): Secondary | ICD-10-CM | POA: Insufficient documentation

## 2016-04-24 DIAGNOSIS — Z8546 Personal history of malignant neoplasm of prostate: Secondary | ICD-10-CM | POA: Insufficient documentation

## 2016-04-24 DIAGNOSIS — I130 Hypertensive heart and chronic kidney disease with heart failure and stage 1 through stage 4 chronic kidney disease, or unspecified chronic kidney disease: Secondary | ICD-10-CM | POA: Diagnosis not present

## 2016-04-24 DIAGNOSIS — I5032 Chronic diastolic (congestive) heart failure: Secondary | ICD-10-CM | POA: Insufficient documentation

## 2016-04-24 DIAGNOSIS — Z794 Long term (current) use of insulin: Secondary | ICD-10-CM | POA: Diagnosis not present

## 2016-04-24 DIAGNOSIS — Z87891 Personal history of nicotine dependence: Secondary | ICD-10-CM | POA: Insufficient documentation

## 2016-04-24 DIAGNOSIS — Z951 Presence of aortocoronary bypass graft: Secondary | ICD-10-CM | POA: Diagnosis not present

## 2016-04-24 DIAGNOSIS — R0602 Shortness of breath: Secondary | ICD-10-CM | POA: Diagnosis present

## 2016-04-24 LAB — CBC
HEMATOCRIT: 38.1 % — AB (ref 39.0–52.0)
Hemoglobin: 11.8 g/dL — ABNORMAL LOW (ref 13.0–17.0)
MCH: 30.4 pg (ref 26.0–34.0)
MCHC: 31 g/dL (ref 30.0–36.0)
MCV: 98.2 fL (ref 78.0–100.0)
PLATELETS: 168 10*3/uL (ref 150–400)
RBC: 3.88 MIL/uL — AB (ref 4.22–5.81)
RDW: 21.5 % — ABNORMAL HIGH (ref 11.5–15.5)
WBC: 11.7 10*3/uL — AB (ref 4.0–10.5)

## 2016-04-24 LAB — BASIC METABOLIC PANEL
ANION GAP: 8 (ref 5–15)
BUN: 21 mg/dL — ABNORMAL HIGH (ref 6–20)
CHLORIDE: 114 mmol/L — AB (ref 101–111)
CO2: 22 mmol/L (ref 22–32)
Calcium: 9 mg/dL (ref 8.9–10.3)
Creatinine, Ser: 1.48 mg/dL — ABNORMAL HIGH (ref 0.61–1.24)
GFR calc non Af Amer: 44 mL/min — ABNORMAL LOW (ref >60)
GFR, EST AFRICAN AMERICAN: 50 mL/min — AB (ref >60)
Glucose, Bld: 102 mg/dL — ABNORMAL HIGH (ref 65–99)
POTASSIUM: 3.9 mmol/L (ref 3.5–5.1)
SODIUM: 144 mmol/L (ref 135–145)

## 2016-04-24 LAB — CBG MONITORING, ED: Glucose-Capillary: 98 mg/dL (ref 65–99)

## 2016-04-24 MED ORDER — HYDRALAZINE HCL 50 MG PO TABS
75.0000 mg | ORAL_TABLET | Freq: Four times a day (QID) | ORAL | Status: DC
  Filled 2016-04-24 (×2): qty 1

## 2016-04-24 MED ORDER — LIDOCAINE HCL 2 % IJ SOLN: 20.0000 mL | Freq: Once | INTRAMUSCULAR | Status: DC

## 2016-04-24 MED ORDER — POTASSIUM CHLORIDE CRYS ER 20 MEQ PO TBCR: 20.0000 meq | EXTENDED_RELEASE_TABLET | Freq: Two times a day (BID) | ORAL | Status: DC

## 2016-04-24 MED ORDER — AMIODARONE HCL 100 MG PO TABS
100.0000 mg | ORAL_TABLET | Freq: Every day | ORAL | Status: DC
  Filled 2016-04-24: qty 1

## 2016-04-24 MED ORDER — CARVEDILOL 25 MG PO TABS
25.0000 mg | ORAL_TABLET | Freq: Two times a day (BID) | ORAL | Status: DC
  Filled 2016-04-24: qty 1

## 2016-04-24 MED ORDER — FUROSEMIDE 40 MG PO TABS: 80.0000 mg | ORAL_TABLET | Freq: Every day | ORAL | Status: DC

## 2016-04-24 NOTE — ED Notes (Signed)
Pt given urinal and encouraged to void when able. Post rounding pt transported to [facility].

## 2016-04-24 NOTE — ED Notes (Signed)
Patient unable to void at this time

## 2016-04-24 NOTE — ED Provider Notes (Signed)
[facility] DEPT Provider Note   CSN: [id number] Arrival date & time: 04/24/16  1130     History   Chief Complaint Chief Complaint  Patient presents with  . Weakness  . Shortness of Breath    HPI [name]. is a 80 y.o. male.  He presents for evaluation of shortness of breath. He is vague about when it started. He came by ambulance. He decided to not take any medicines or eat today because he was coming to the hospital. He is being seen at assisted by home health nursing, for PICC line administration of Ancef twice a day. His wife is doing most of the infusions. He is not having diarrhea and one week. His discharge from the hospital about a week ago, being treated for endocarditis, and C. difficile enterocolitis. He was unable to afford , vancomycin, so is not taking it. Again, he is not having diarrhea at this time. He denies fever, chills, cough, chest pain, abdominal pain or back pain. Patient is receiving essentially total care with his wife being his primary caregiver. He uses a urinal for urination, and a diaper for stooling. He is essentially in bed all the time. There are no other no modifying factors.  HPI  Past Medical History:  Diagnosis Date  . Angiomyolipoma 2009   On both kidneys noted in 2009  . Arthritis    neck and left wrist  . Atrial fibrillation ([place])   . CAD (coronary artery disease)    s/b CABG 1994, and subsequent stents. Repeat CABG 12/2011,  . Chronic diastolic heart failure ([place])   . CKD (chronic kidney disease)   . Clostridium difficile diarrhea 03/2016  . Depressive disorder   . Diabetes mellitus type 2 with peripheral artery disease (HCC)    DIET CONTROLLED  . DVT (deep venous thrombosis) ([place]) 2011   Right arm  . Gastroparesis   . GERD (gastroesophageal reflux disease)   . Gout   . History of hiatal hernia   . Hyperlipidemia   . Hypertension   . Internal hemorrhoids without mention of complication   . Ischemic colitis ([place])     . 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 Myelodysplastic syndrome ([place]) 05/22/2013   With low hemoglobin and platelets treated with Procrit  . Osteopenia   . Peptic ulcer    S/p partial gastrectomy in 1969  . Peripheral artery disease ([place])   . Pneumonia 01/16/2016  . Pneumonia 03/2016  . Presence of permanent cardiac pacemaker   . Prostate cancer ([place]) 1997   XRT and lupron  . Renal artery stenosis ([place])   . Sick sinus syndrome ([place])   . Vitamin B 12 deficiency     Patient Active Problem List   Diagnosis Date Noted  . Anemia, iron deficiency 04/16/2016  . Endocarditis of tricuspid valve   . C. difficile colitis 04/10/2016  . HCAP (healthcare-associated pneumonia) 04/08/2016  . Chronic kidney disease, stage III (moderate) 03/09/2016  . Pressure ulcer 02/23/2016  . Seizure ([place]) 02/20/2016  . Palliative care encounter   . Goals of care, counseling/discussion   . Arterial hypotension   . Pacemaker infection ([place])   . Acute renal failure superimposed on stage 4 chronic kidney disease ([place]) 01/26/2016  . Bacteremia due to Staphylococcus aureus   . Pseudogout involving multiple joints 01/24/2016  . Bladder mass 01/24/2016  . Hemoptysis   . Pain   . Left upper extremity swelling   . Staphylococcus aureus bacteremia 01/17/2016  . CAP (community  acquired pneumonia) 01/16/2016  . S/P hernia repair 10/28/2015  . Right inguinal hernia 09/09/2015  . GIB (gastrointestinal bleeding) 08/29/2015  . Hematuria 05/19/2015  . Thrombocytopenia ([place]) 11/11/2014  . Leukopenia 11/11/2014  . B12 deficiency 08/31/2013  . Pacemaker 08/26/2013  . On amiodarone therapy 08/21/2013  . Gout due to renal impairment, left ankle and foot 06/13/2013    Class: Acute  . CAD of autologous bypass graft 06/05/2013  . MDS (myelodysplastic syndrome) ([place]) 05/22/2013  . Anemia of chronic disease 03/19/2013  . CKD (chronic kidney disease), stage IV ([place]) 03/18/2013  . NSVT (nonsustained ventricular tachycardia) ([place])  03/18/2013  . Chronic diastolic heart failure ([place]) 03/17/2013  . Diabetes mellitus ([place]) 03/17/2013  . Essential hypertension 03/17/2013  . Mesenteric artery stenosis ([place]) 05/01/2012  . PAF (paroxysmal atrial fibrillation) ([place]) 12/24/2011  . PAD (peripheral artery disease) ([place]) 12/24/2011  . Chronic vascular insufficiency of intestine ([place]) 12/20/2011    Past Surgical History:  Procedure Laterality Date  . ABDOMINAL ANGIOGRAM N/A 05/25/2011   Procedure: ABDOMINAL ANGIOGRAM;  Surgeon: Serafina Mitchell, MD;  Location: [facility] CATH LAB;  Service: Cardiovascular;  Laterality: N/A;  . ABDOMINAL ANGIOGRAM N/A 02/13/2013   Procedure: ABDOMINAL ANGIOGRAM;  Surgeon: Serafina Mitchell, MD;  Location[facility] CATH LAB;  Service: Cardiovascular;  Laterality: N/A;  . APPENDECTOMY  1991  . CELIAC ARTERY ANGIOPLASTY  05-16-12   and stenting  . CHOLECYSTECTOMY  Oct 2009   Laparoscopic  . CORONARY ARTERY BYPASS GRAFT  01/22/1993  . CORONARY ARTERY BYPASS GRAFT  01/03/2012   Procedure: REDO CORONARY ARTERY BYPASS GRAFTING (CABG);  Surgeon: Gaye Pollack, MD;  Location: [facility];  Service: Open Heart Surgery;  Laterality: N/A;  Redo CABG x  using bilateral internal mammary arteries;  left leg greater saphenous vein harvested endoscopically  . FOREIGN BODY REMOVAL ABDOMINAL Right 01/27/2016   Procedure: EXPOSURE OF RIGHT COMMON FEMORAL ARTERY AND REMOVAL FOREIGN;  Surgeon: Evans Lance, MD;  Location: [facility];  Service: Cardiovascular;  Laterality: Right;  . HIATAL HERNIA REPAIR     and ulcer repair  . I&D EXTREMITY Left 01/21/2016   Procedure: IRRIGATION AND DEBRIDEMENT EXTREMITY;  Surgeon: Milly Jakob, MD;  Location: [facility];  Service: Orthopedics;  Laterality: Left;  . INGUINAL HERNIA REPAIR Right 10/28/2015   Procedure: OPEN RIGHT INGUINAL HERNIA REPAIR;  Surgeon: Greer Pickerel, MD;  Location: [facility];  Service: General;  Laterality: Right;  . INSERTION OF MESH Right 10/28/2015   Procedure: INSERTION OF MESH;  Surgeon:  Greer Pickerel, MD;  Location: [facility];  Service: General;  Laterality: Right;  . IR GENERIC HISTORICAL  02/02/2016   IR FLUORO GUIDE CV LINE LEFT 02/02/2016 Markus Daft, MD [facility]  . IR GENERIC HISTORICAL  02/02/2016   IR US GUIDE VASC ACCESS LEFT 02/02/2016 Markus Daft, MD [facility]  . IR GENERIC HISTORICAL  03/31/2016   IR REMOVAL TUN CV CATH W/O FL 03/31/2016 Arne Cleveland, MD [facility]  . LEFT HEART CATHETERIZATION WITH CORONARY/GRAFT ANGIOGRAM  12/24/2011   Procedure: LEFT HEART CATHETERIZATION WITH Beatrix Fetters;  Surgeon: Sinclair Grooms, MD;  Location: [facility] CATH LAB;  Service: Cardiovascular;;  . LOWER EXTREMITY ANGIOGRAM Bilateral 05/25/2011   Procedure: LOWER EXTREMITY ANGIOGRAM;  Surgeon: Serafina Mitchell, MD;  Location: [facility] CATH LAB;  Service: Cardiovascular;  Laterality: Bilateral;  . OTHER SURGICAL HISTORY  02/13/13   superior mesenteric artery angiogram  . OTHER SURGICAL HISTORY  05/16/12   Stent in stomach  .  PACEMAKER GENERATOR CHANGE  12/10/2003   SJM Identity XL DR performed by Dr Leonia Reeves  . PACEMAKER INSERTION  10/18/1994   DDD pacemaker, [employer]. Gen change 12/10/2003.  Marland Kitchen PACEMAKER LEAD REMOVAL Right 01/27/2016   Procedure: PACEMAKER EXTRACTION;  Surgeon: Evans Lance, MD;  Location: [facility];  Service: Cardiovascular;  Laterality: Right;  DR. Roxy Manns TO BACKUP CASE  . PARTIAL GASTRECTOMY  1969   Hx of ulcer s/p partial gastrectomy/ has pernicious anemia  . RENAL ANGIOGRAM N/A 02/13/2013   Procedure: RENAL ANGIOGRAM;  Surgeon: Serafina Mitchell, MD;  Location: [facility] CATH LAB;  Service: Cardiovascular;  Laterality: N/A;  . TEE WITHOUT CARDIOVERSION N/A 01/26/2016   Procedure: TRANSESOPHAGEAL ECHOCARDIOGRAM (TEE);  Surgeon: Sueanne Margarita, MD;  Location: [facility] ENDOSCOPY;  Service: Cardiovascular;  Laterality: N/A;  . TEE WITHOUT CARDIOVERSION N/A 01/27/2016   Procedure: TRANSESOPHAGEAL ECHOCARDIOGRAM (TEE);  Surgeon: Evans Lance, MD;  Location: [facility];  Service: Cardiovascular;   Laterality: N/A;  . TEE WITHOUT CARDIOVERSION N/A 04/12/2016   Procedure: TRANSESOPHAGEAL ECHOCARDIOGRAM (TEE);  Surgeon: Dorothy Spark, MD;  Location: [facility];  Service: Cardiovascular;  Laterality: N/A;  . THROMBECTOMY / EMBOLECTOMY SUBCLAVIAN ARTERY  02/02/10   Right subclavian thromboectomy and venous angioplasty, and chronic mesenteric ischemia with Herculink stenting to superior mesenteric and celiac arteries - Dr. Trula Slade  . TRANSURETHRAL RESECTION OF PROSTATE N/A 02/22/2016   Procedure: CYSTO, CLOT EVACUATION, FULGERATION;  Surgeon: Alexis Frock, MD;  Location[facility];  Service: Urology;  Laterality: N/A;  . VISCERAL ANGIOGRAM N/A 05/25/2011   Procedure: VISCERAL ANGIOGRAM;  Surgeon: Serafina Mitchell, MD;  Location: [facility] CATH LAB;  Service: Cardiovascular;  Laterality: N/A;  . VISCERAL ANGIOGRAM Bilateral 12/28/2011   Procedure: VISCERAL ANGIOGRAM;  Surgeon: Serafina Mitchell, MD;  Location: [facility] CATH LAB;  Service: Cardiovascular;  Laterality: Bilateral;  . VISCERAL ANGIOGRAM N/A 05/16/2012   Procedure: VISCERAL ANGIOGRAM;  Surgeon: Serafina Mitchell, MD;  Location: [facility] CATH LAB;  Service: Cardiovascular;  Laterality: N/A;  . VISCERAL ANGIOGRAM N/A 02/13/2013   Procedure: VISCERAL ANGIOGRAM;  Surgeon: Serafina Mitchell, MD;  Location: [facility] CATH LAB;  Service: Cardiovascular;  Laterality: N/A;       Home Medications    Prior to Admission medications   Medication Sig Start Date End Date Taking? Authorizing Provider  acetaminophen (ARTHRITIS PAIN RELIEF) 650 MG CR tablet Take 650 mg by mouth 3 (three) times daily.     Historical Provider, MD  amiodarone (PACERONE) 200 MG tablet Take one-half tablet by  mouth daily 01/07/16   Belva Crome, MD  atorvastatin (LIPITOR) 80 MG tablet Take 80 mg by mouth at bedtime.     Historical Provider, MD  Calcifediol ER (RAYALDEE) 30 MCG CPCR Take 30 mcg by mouth at bedtime.    Historical Provider, MD  carvedilol (COREG) 25 MG tablet Take 1 tablet (25 mg  total) by mouth 2 (two) times daily with a meal. 04/16/16   Nishant Dhungel, MD  ceFAZolin (ANCEF) 1 GM/50ML Inject 50 mLs (1 g total) into the vein every 12 (twelve) hours. 04/16/16 05/26/16  Nishant Dhungel, MD  cyanocobalamin (,VITAMIN B-12,) 1000 MCG/ML injection Inject 1,000 mcg into the muscle every 30 (thirty) days. Vitamin B12 - last injection 09/01/15    Historical Provider, MD  epoetin alfa (EPOGEN,PROCRIT) 09811 UNIT/ML injection Inject 10,000 Units into the skin every 14 (fourteen) days. Done at [facility] - last injection 09/22/15    Historical Provider, MD  ferrous sulfate 325 (  65 FE) MG tablet Take 1 tablet (325 mg total) by mouth 3 (three) times daily with meals. 02/24/16   Venetia Maxon Rama, MD  furosemide (LASIX) 80 MG tablet Take 1 tablet (80 mg total) by mouth daily. 02/24/16   Venetia Maxon Rama, MD  hydrALAZINE (APRESOLINE) 25 MG tablet Take 3 tablets (75 mg total) by mouth 4 (four) times daily. 02/24/16   Christina P Rama, MD  insulin aspart (NOVOLOG) 100 UNIT/ML injection Inject 0-9 Units into the skin. SSI:  121-150 = 1 unit, 151-200 = 2 units, 201-250 = 3 units, 251-300 = 5 units, 301-350 = 7 units, 351-400 = 9 units    Historical Provider, MD  isosorbide mononitrate (IMDUR) 120 MG 24 hr tablet Take 1 tablet by mouth  daily 01/07/16   Belva Crome, MD  leuprolide (LUPRON) 30 MG injection Inject 30 mg into the muscle every 4 (four) months.    Historical Provider, MD  Multiple Vitamin (MULTIVITAMIN WITH MINERALS) TABS tablet Take 1 tablet by mouth every morning.    Historical Provider, MD  potassium chloride SA (K-DUR,KLOR-CON) 20 MEQ tablet Take 20-40 mEq by mouth 2 (two) times daily. Give 40 mEq BID and 20 mEq qhs    Historical Provider, MD  senna-docusate (SENOKOT-S) 8.6-50 MG tablet Take 2 tablets by mouth 2 (two) times daily. 02/03/16   Silver Huguenin Elgergawy, MD  silodosin (RAPAFLO) 8 MG CAPS capsule Take 8 mg by mouth at bedtime.     Historical Provider, MD  sodium bicarbonate  650 MG tablet Take 1 tablet (650 mg total) by mouth 2 (two) times daily. 02/03/16   Silver Huguenin Elgergawy, MD  UNABLE TO FIND Med Name: Med pass 120 mL 2 times daily    Historical Provider, MD  vancomycin (VANCOCIN) 50 mg/mL oral solution Take 2.5 mLs (125 mg total) by mouth 4 (four) times daily. 04/16/16 04/24/16  Flonnie Overman Dhungel, MD    Family History Family History  Problem Relation Age of Onset  . Diabetes Mother   . Heart disease Mother     Heart Disease before age 82  . Hyperlipidemia Mother   . Hypertension Mother   . CVA Mother 107    cause of death  . Cancer Father     stomach/liver  . Hypertension Father     possibly hypertensive  . Cancer Sister     Breast cancer  . CAD Brother   . Heart disease Brother   . Cancer Paternal Uncle     colon  . Heart disease Sister   . Diabetes Sister   . Hypertension Sister   . Heart disease Daughter     Heart Disease before age 49  . Hypertension Daughter   . Heart attack Daughter     Social History Social History  Substance Use Topics  . Smoking status: Former Smoker    Years: 20.00    Types: Cigarettes    Quit date: 07/12/1969  . Smokeless tobacco: Never Used  . Alcohol use No     Allergies   Nsaids; Ibuprofen; and Ace inhibitors   Review of Systems Review of Systems  All other systems reviewed and are negative.    Physical Exam Updated Vital Signs BP (!) 221/105 (BP Location: Left Arm)   Pulse 84   Temp 97.8 F (36.6 C) (Oral)   Resp 20   Ht 5\' 10"  (1.778 m)   Wt 175 lb (79.4 kg)   SpO2 95%   BMI 25.11 kg/m   Physical  Exam  Constitutional: He is oriented to person, place, and time. He appears well-developed. No distress.  Elderly, frail. He requires help to sit up in the bed.  HENT:  Head: Normocephalic and atraumatic.  Right Ear: External ear normal.  Left Ear: External ear normal.  Eyes: Conjunctivae and EOM are normal. Pupils are equal, round, and reactive to light.  Neck: Normal range of motion and  phonation normal. Neck supple.  Cardiovascular: Normal rate, regular rhythm and normal heart sounds.   Hypertension is present.  Pulmonary/Chest: Effort normal and breath sounds normal. He exhibits no bony tenderness.  Abdominal: Soft. There is no tenderness.  Musculoskeletal: Normal range of motion.  Neurological: He is alert and oriented to person, place, and time. No cranial nerve deficit or sensory deficit. He exhibits normal muscle tone. Coordination normal.  Skin: Skin is warm, dry and intact.  Psychiatric: He has a normal mood and affect. His behavior is normal.  Nursing note and vitals reviewed.    ED Treatments / Results  Labs (all labs ordered are listed, but only abnormal results are displayed) Labs Reviewed  BASIC METABOLIC PANEL - Abnormal; Notable for the following:       Result Value   Chloride 114 (*)    Glucose, Bld 102 (*)    BUN 21 (*)    Creatinine, Ser 1.48 (*)    GFR calc non Af Amer 44 (*)    GFR calc Af Amer 50 (*)    All other components within normal limits  CBC - Abnormal; Notable for the following:    WBC 11.7 (*)    RBC 3.88 (*)    Hemoglobin 11.8 (*)    HCT 38.1 (*)    RDW 21.5 (*)    All other components within normal limits  CBG MONITORING, ED  CBG MONITORING, ED    EKG  EKG Interpretation None       Radiology Dg Chest 2 View  Result Date: 04/24/2016 CLINICAL DATA:  c/o weakness x 1 week and SOB x 2 days. Per pt's chart, pt was discharged from the hospital 04/16/16 with a diagnosis of endocarditis. A-fib, CAD, CHF, DM II, GERD, HTN, h/o PNA, h/o prostate cancer, former smoker x 46 years ago [id number], pacemaker EXAM: CHEST  2 VIEW COMPARISON:  Chest x-rays dated 04/08/2016 02/20/2016. FINDINGS: Cardiomegaly is stable. Overall cardiomediastinal silhouette appears stable in size and configuration. Atherosclerotic changes again noted at the aortic arch. Right-sided PICC line appears adequately positioned with tip at the level of the mid SVC.  Median sternotomy wires appear intact and stable in [place] bibasilar opacities are not significantly changed, pneumonia versus atelectasis. Suspect small bilateral pleural effusions. Again noted is prominence of the pulmonary vasculature, stable, suggesting some degree of volume overload/CHF. No pneumothorax seen. IMPRESSION: 1. Overall stable chest x-ray. Patchy bibasilar airspace opacities appear stable, pneumonia versus atelectasis, with probable small adjacent pleural effusions. No new lung findings. 2. Stable prominence of the pulmonary vasculature suggesting some degree of volume overload/CHF. 3. Stable cardiomegaly. 4. Aortic atherosclerosis. Electronically Signed   By: Franki Cabot M.D.   On: 04/24/2016 13:30    Procedures Procedures (including critical care time)  Medications Ordered in ED Medications - No data to display   Initial Impression / Assessment and Plan / ED Course  I have reviewed the triage vital signs and the nursing notes.  Pertinent labs & imaging results that were available during my care of the patient were reviewed by me and considered  in my medical decision making (see chart for details).  Clinical Course    Medications - No data to display  Patient Vitals for the past 24 hrs:  BP Temp Temp src Pulse Resp SpO2 Height Weight  04/24/16 1437 (!) 221/105 97.8 F (36.6 C) Oral 84 20 95 % - -  04/24/16 1315 (!) 214/93 98 F (36.7 C) Oral 78 18 94 % - -  04/24/16 1300 (!) 218/102 - - 85 21 94 % - -  04/24/16 1154 - - - - - 93 % 5\' 10"  (1.778 m) 175 lb (79.4 kg)  04/24/16 1142 (!) 216/109 97.5 F (36.4 C) Oral - 22 93 % - -  04/24/16 1140 - - - - - 96 % - -    3:02 PM Reevaluation with update and discussion. After initial assessment and treatment, an updated evaluation reveals No change in clinical status. Patient prefers to take his medicines, which he has at home, after he gets there. He was offered medicines here, but declined. Findings discussed with  patient and wife, all questions answered. Marcelia Petersen L    Final Clinical Impressions(s) / ED Diagnoses   Final diagnoses:  Dyspnea, unspecified type   Nonspecific dyspnea, with reassuring oxygenation on room air here. He did not take his usual medicines today. Therefore, his blood pressure is high. No evidence for hypertensive urgency, metabolic instability or serious bacterial infection at this time. He is a chronically ill patient with multiple medical problems and is being a properly treated in the home setting at this time.  Nursing Notes Reviewed/ Care Coordinated Applicable Imaging Reviewed Interpretation of Laboratory Data incorporated into ED treatment  The patient appears reasonably screened and/or stabilized for discharge and I doubt any other medical condition or other [facility] requiring further screening, evaluation, or treatment in the ED at this time prior to discharge.  Plan: Home Medications- continue; Home Treatments- rest; return here if the recommended treatment, does not improve the symptoms; Recommended follow up- PCP prn. Touch base with ID regarding lack of treatment with vancomycin.   New Prescriptions New Prescriptions   No medications on file     Daleen Bo, MD 04/24/16 1506

## 2016-04-24 NOTE — Discharge Instructions (Signed)
Your oxygen status is normal today.  It is important to take all of your medicines as prescribed.  Since you're unable to afford the vancomycin, you should contact Dr. Johnnye Sima to discuss this. However, since you're not having diarrhea. You probably do not need to take additional indications for C. difficile.

## 2016-04-24 NOTE — ED Triage Notes (Signed)
Pt presents to ED via EMS c/o weakness x 1 week and SOB x 2 days. Per pt's chart, pt was discharged from the hospital 04/16/16 with a diagnosis of endocarditis. Pt was prescribed vancomycin for C diff and Ancef through right upper PICC line. Pt also reports chills, non productive cough, and subjective fever. Per EMS, pt's family states pt's BLE are "more swollen than normal". Denies SOB at this time. Denies N/V or CP. Facial symmetry noted. Cap refill <3 secs.

## 2016-04-24 NOTE — ED Notes (Signed)
Pt offered to be given home medications prior to discharge. Pt verbalizes will take at home. [place] notified.

## 2016-04-24 NOTE — ED Notes (Signed)
Blood draw attempt LEFT AC and FOREARM unsuccessful. Ebony phlebotomy at bedside attempting blood draw at present time.

## 2016-04-24 NOTE — ED Notes (Signed)
Bed: [id number] Expected date: 04/24/16 Expected time: 11:24 AM Means of arrival: Ambulance Comments: Weakness

## 2016-04-28 ENCOUNTER — Encounter: Payer: Self-pay | Admitting: Interventional Cardiology

## 2016-04-29 ENCOUNTER — Encounter: Payer: Self-pay | Admitting: Physician Assistant

## 2016-04-30 NOTE — Progress Notes (Signed)
Thanks

## 2016-05-03 ENCOUNTER — Ambulatory Visit (INDEPENDENT_AMBULATORY_CARE_PROVIDER_SITE_OTHER): Payer: Medicare Other | Admitting: Physician Assistant

## 2016-05-03 ENCOUNTER — Encounter: Payer: Self-pay | Admitting: Physician Assistant

## 2016-05-03 VITALS — BP 98/44 | HR 83 | Ht 70.0 in | Wt 152.8 lb

## 2016-05-03 DIAGNOSIS — I10 Essential (primary) hypertension: Secondary | ICD-10-CM

## 2016-05-03 DIAGNOSIS — I368 Other nonrheumatic tricuspid valve disorders: Secondary | ICD-10-CM

## 2016-05-03 DIAGNOSIS — Z950 Presence of cardiac pacemaker: Secondary | ICD-10-CM | POA: Insufficient documentation

## 2016-05-03 DIAGNOSIS — I480 Paroxysmal atrial fibrillation: Secondary | ICD-10-CM

## 2016-05-03 DIAGNOSIS — I952 Hypotension due to drugs: Secondary | ICD-10-CM | POA: Diagnosis not present

## 2016-05-03 DIAGNOSIS — R7881 Bacteremia: Secondary | ICD-10-CM

## 2016-05-03 DIAGNOSIS — N183 Chronic kidney disease, stage 3 unspecified: Secondary | ICD-10-CM

## 2016-05-03 DIAGNOSIS — I25810 Atherosclerosis of coronary artery bypass graft(s) without angina pectoris: Secondary | ICD-10-CM

## 2016-05-03 DIAGNOSIS — I5032 Chronic diastolic (congestive) heart failure: Secondary | ICD-10-CM

## 2016-05-03 DIAGNOSIS — I079 Rheumatic tricuspid valve disease, unspecified: Secondary | ICD-10-CM

## 2016-05-03 NOTE — Progress Notes (Signed)
Cardiology Office Note    Date:  05/03/2016   ID:  [name] Marble Sr., DOB 1935/12/15, MRN [id number]  PCP:  [name], MD  Cardiologist: Dr. Daneen Schick  No chief complaint on file.   History of Present Illness:  [name] Venables. is a 80 y.o. male h/o CAD status post CABG 1994 with subsequent stents and repeat CABG in 2013, hypertension, HLD, CKD, chronic diastolic heart failure, sinus bradycardia sp PPM (SJM) by Dr Leonia Reeves. Has PAF, CHADSVASC=5 but is not anticoagulated for atrial fibrillation due to prior GI bleed. He is on aspirin and Plavix. Saw Dr. Rayann Heman in 10/2015 to discuss watchman. Because he had no atrial fibrillation for the past 6 months conservative management was recommended. Amiodarone was continued.  Patient developed Staphylococcus areuos bacteremia 01/2016 and was found to have an echo density upon the atrial lead of his pacemaker. His ventricular pacemaker leads were extracted.  Patient was readmitted 04/12/16 with C. difficile colitis and hypertensive urgency with blood pressure of 237/97. He also had mild elevated troponins and 2-D echo showed a possible tricuspid vegetation which was confirmed on TEE. 3 sets of blood cultures were negative for growth. It's felt he had endocarditis from prior bacteremia. Thoracic surgery was consult it who recommended that he is not a candidate for surgical treatment given his age, multiple comorbidities and prior CABG and calcified vessels. Antibiotics were recommended for 6 weeks.  He was seen in the emergency room 04/24/16 for shortness of breath. He had not taken any of his medications prior to coming to the emergency room. There is no evidence of metabolic instability or serious bacterial infection. He was discharged home without any change. Oxygen was stable. Blood pressure was very high but patient preferred to take his medications once he got home. Was 221/105.  Patient and saw Dr. Hulan Fess 04/28/16 asking for  home O2. He was felt to have CHF with pulse ox of 88% with minimal exertion and 1+ pitting edema. Lasix was increased to 120 mg twice a day. Dr. Rex Kras requested that we see the patient.  Patient comes in today accompanied by his wife and son. He feels terrible. He is dizzy and still short of breath. Very weak. BP is low today. Was [id number] systolic on Friday and PT couldn't work with him. Has lost 23 pounds since Lasix was increased last week. Was denied oxygen b/c O2 sat's weren't low enough.     Past Medical History:  Diagnosis Date  . Angiomyolipoma 2009   On both kidneys noted in 2009  . Arthritis    neck and left wrist  . Atrial fibrillation ([place])   . CAD (coronary artery disease)    s/b CABG 1994, and subsequent stents. Repeat CABG 12/2011,  . Chronic diastolic heart failure ([place])   . CKD (chronic kidney disease)   . Clostridium difficile diarrhea 03/2016  . Depressive disorder   . Diabetes mellitus type 2 with peripheral artery disease (HCC)    DIET CONTROLLED  . DVT (deep venous thrombosis) ([place]) 2011   Right arm  . Gastroparesis   . GERD (gastroesophageal reflux disease)   . Gout   . History of hiatal hernia   . Hyperlipidemia   . Hypertension   . Internal hemorrhoids without mention of complication   . Ischemic colitis ([place])   . 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 Myelodysplastic syndrome ([place]) 05/22/2013   With low hemoglobin and platelets treated with Procrit  . Osteopenia   .  Peptic ulcer    S/p partial gastrectomy in 1969  . Peripheral artery disease ([place])   . Pneumonia 01/16/2016  . Pneumonia 03/2016  . Presence of permanent cardiac pacemaker   . Prostate cancer ([place]) 1997   XRT and lupron  . Renal artery stenosis ([place])   . Sick sinus syndrome ([place])   . Vitamin B 12 deficiency     Past Surgical History:  Procedure Laterality Date  . ABDOMINAL ANGIOGRAM N/A 05/25/2011   Procedure: ABDOMINAL ANGIOGRAM;  Surgeon: Serafina Mitchell, MD;  Location: [facility] CATH LAB;  Service:  Cardiovascular;  Laterality: N/A;  . ABDOMINAL ANGIOGRAM N/A 02/13/2013   Procedure: ABDOMINAL ANGIOGRAM;  Surgeon: Serafina Mitchell, MD;  Location: [facility] CATH LAB;  Service: Cardiovascular;  Laterality: N/A;  . APPENDECTOMY  1991  . CELIAC ARTERY ANGIOPLASTY  05-16-12   and stenting  . CHOLECYSTECTOMY  Oct 2009   Laparoscopic  . CORONARY ARTERY BYPASS GRAFT  01/22/1993  . CORONARY ARTERY BYPASS GRAFT  01/03/2012   Procedure: REDO CORONARY ARTERY BYPASS GRAFTING (CABG);  Surgeon: Gaye Pollack, MD;  Location: [place];  Service: Open Heart Surgery;  Laterality: N/A;  Redo CABG x  using bilateral internal mammary arteries;  left leg greater saphenous vein harvested endoscopically  . FOREIGN BODY REMOVAL ABDOMINAL Right 01/27/2016   Procedure: EXPOSURE OF RIGHT COMMON FEMORAL ARTERY AND REMOVAL FOREIGN;  Surgeon: Evans Lance, MD;  Location: [place];  Service: Cardiovascular;  Laterality: Right;  . HIATAL HERNIA REPAIR     and ulcer repair  . I&D EXTREMITY Left 01/21/2016   Procedure: IRRIGATION AND DEBRIDEMENT EXTREMITY;  Surgeon: Milly Jakob, MD;  Location: [place];  Service: Orthopedics;  Laterality: Left;  . INGUINAL HERNIA REPAIR Right 10/28/2015   Procedure: OPEN RIGHT INGUINAL HERNIA REPAIR;  Surgeon: Greer Pickerel, MD;  Location: [facility];  Service: General;  Laterality: Right;  . INSERTION OF MESH Right 10/28/2015   Procedure: INSERTION OF MESH;  Surgeon: Greer Pickerel, MD;  Location: [facility];  Service: General;  Laterality: Right;  . IR GENERIC HISTORICAL  02/02/2016   IR FLUORO GUIDE CV LINE LEFT 02/02/2016 [name] Daft, MD [facility]  . IR GENERIC HISTORICAL  02/02/2016   IR US GUIDE VASC ACCESS LEFT 02/02/2016 [name] Daft, MD [facility]  . IR GENERIC HISTORICAL  03/31/2016   IR REMOVAL TUN CV CATH W/O FL 03/31/2016 Arne Cleveland, MD [facility]  . LEFT HEART CATHETERIZATION WITH CORONARY/GRAFT ANGIOGRAM  12/24/2011   Procedure: LEFT HEART CATHETERIZATION WITH Beatrix Fetters;  Surgeon: Sinclair Grooms, MD;  Location: [facility] CATH LAB;  Service: Cardiovascular;;  . LOWER EXTREMITY ANGIOGRAM Bilateral 05/25/2011   Procedure: LOWER EXTREMITY ANGIOGRAM;  Surgeon: Serafina Mitchell, MD;  Location: [facility] CATH LAB;  Service: Cardiovascular;  Laterality: Bilateral;  . OTHER SURGICAL HISTORY  02/13/13   superior mesenteric artery angiogram  . OTHER SURGICAL HISTORY  05/16/12   Stent in stomach  . PACEMAKER GENERATOR CHANGE  12/10/2003   SJM Identity XL DR performed by Dr Leonia Reeves  . PACEMAKER INSERTION  10/18/1994   DDD pacemaker, [employer]. Gen change 12/10/2003.  Marland Kitchen PACEMAKER LEAD REMOVAL Right 01/27/2016   Procedure: PACEMAKER EXTRACTION;  Surgeon: Evans Lance, MD;  Location: [place];  Service: Cardiovascular;  Laterality: Right;  DR. Roxy Manns TO BACKUP CASE  . PARTIAL GASTRECTOMY  1969   Hx of ulcer s/p partial gastrectomy/ has pernicious anemia  . RENAL ANGIOGRAM N/A 02/13/2013   Procedure: RENAL ANGIOGRAM;  Surgeon: Serafina Mitchell, MD;  Location: [facility] CATH LAB;  Service: Cardiovascular;  Laterality: N/A;  . TEE WITHOUT CARDIOVERSION N/A 01/26/2016   Procedure: TRANSESOPHAGEAL ECHOCARDIOGRAM (TEE);  Surgeon: Sueanne Margarita, MD;  Location: [facility] ENDOSCOPY;  Service: Cardiovascular;  Laterality: N/A;  . TEE WITHOUT CARDIOVERSION N/A 01/27/2016   Procedure: TRANSESOPHAGEAL ECHOCARDIOGRAM (TEE);  Surgeon: Evans Lance, MD;  Location: [place];  Service: Cardiovascular;  Laterality: N/A;  . TEE WITHOUT CARDIOVERSION N/A 04/12/2016   Procedure: TRANSESOPHAGEAL ECHOCARDIOGRAM (TEE);  Surgeon: Dorothy Spark, MD;  Location: [place];  Service: Cardiovascular;  Laterality: N/A;  . THROMBECTOMY / EMBOLECTOMY SUBCLAVIAN ARTERY  02/02/10   Right subclavian thromboectomy and venous angioplasty, and chronic mesenteric ischemia with Herculink stenting to superior mesenteric and celiac arteries - Dr. Trula Slade  . TRANSURETHRAL RESECTION OF PROSTATE N/A 02/22/2016   Procedure: CYSTO, CLOT EVACUATION, FULGERATION;  Surgeon:  Alexis Frock, MD;  [facility];  Service: Urology;  Laterality: N/A;  . VISCERAL ANGIOGRAM N/A 05/25/2011   Procedure: VISCERAL ANGIOGRAM;  Surgeon: Serafina Mitchell, MD;  Location: [facility] CATH LAB;  Service: Cardiovascular;  Laterality: N/A;  . VISCERAL ANGIOGRAM Bilateral 12/28/2011   Procedure: VISCERAL ANGIOGRAM;  Surgeon: Serafina Mitchell, MD;  Location: [facility] CATH LAB;  Service: Cardiovascular;  Laterality: Bilateral;  . VISCERAL ANGIOGRAM N/A 05/16/2012   Procedure: VISCERAL ANGIOGRAM;  Surgeon: Serafina Mitchell, MD;  Location: [facility] CATH LAB;  Service: Cardiovascular;  Laterality: N/A;  . VISCERAL ANGIOGRAM N/A 02/13/2013   Procedure: VISCERAL ANGIOGRAM;  Surgeon: Serafina Mitchell, MD;  Location: [facility] CATH LAB;  Service: Cardiovascular;  Laterality: N/A;    Current Medications: Outpatient Medications Prior to Visit  Medication Sig Dispense Refill  . acetaminophen (ARTHRITIS PAIN RELIEF) 650 MG CR tablet Take 650 mg by mouth 3 (three) times daily.     Marland Kitchen amiodarone (PACERONE) 200 MG tablet Take one-half tablet by  mouth daily 45 tablet 2  . atorvastatin (LIPITOR) 80 MG tablet Take 80 mg by mouth at bedtime.     . Calcifediol ER (RAYALDEE) 30 MCG CPCR Take 30 mcg by mouth at bedtime.    . carvedilol (COREG) 25 MG tablet Take 1 tablet (25 mg total) by mouth 2 (two) times daily with a meal. 60 tablet 0  . ceFAZolin (ANCEF) 1 GM/50ML Inject 50 mLs (1 g total) into the vein every 12 (twelve) hours. 1500 mL 0  . cyanocobalamin (,VITAMIN B-12,) 1000 MCG/ML injection Inject 1,000 mcg into the muscle every 30 (thirty) days. Vitamin B12 - last injection 09/01/15    . epoetin alfa (EPOGEN,PROCRIT) 24401 UNIT/ML injection Inject 10,000 Units into the skin every 14 (fourteen) days. Done at [facility] - last injection 09/22/15    . ferrous sulfate 325 (65 FE) MG tablet Take 1 tablet (325 mg total) by mouth 3 (three) times daily with meals.  3  . furosemide (LASIX) 80 MG tablet Take 1 tablet (80 mg total) by  mouth daily. 30 tablet   . hydrALAZINE (APRESOLINE) 25 MG tablet Take 3 tablets (75 mg total) by mouth 4 (four) times daily.    . insulin aspart (NOVOLOG) 100 UNIT/ML injection Inject 0-9 Units into the skin. SSI:  121-150 = 1 unit, 151-200 = 2 units, 201-250 = 3 units, 251-300 = 5 units, 301-350 = 7 units, 351-400 = 9 units    . isosorbide mononitrate (IMDUR) 120 MG 24 hr tablet Take 1 tablet by mouth  daily 90 tablet 2  . leuprolide (  LUPRON) 30 MG injection Inject 30 mg into the muscle every 4 (four) months.    . Multiple Vitamin (MULTIVITAMIN WITH MINERALS) TABS tablet Take 1 tablet by mouth every morning.    . potassium chloride SA (K-DUR,KLOR-CON) 20 MEQ tablet Take 20-40 mEq by mouth 2 (two) times daily. Give 40 mEq BID and 20 mEq qhs    . senna-docusate (SENOKOT-S) 8.6-50 MG tablet Take 2 tablets by mouth 2 (two) times daily.    . silodosin (RAPAFLO) 8 MG CAPS capsule Take 8 mg by mouth at bedtime.     . sodium bicarbonate 650 MG tablet Take 1 tablet (650 mg total) by mouth 2 (two) times daily.    Marland Kitchen UNABLE TO FIND Med Name: Med pass 120 mL 2 times daily     No facility-administered medications prior to visit.      Allergies:   Nsaids; Ibuprofen; and Ace inhibitors   Social History   Social History  . Marital status: Married    Spouse name: N/A  . Number of children: N/A  . Years of education: N/A   Social History Main Topics  . Smoking status: Former Smoker    Years: 20.00    Types: Cigarettes    Quit date: 07/12/1969  . Smokeless tobacco: Never Used  . Alcohol use No  . Drug use: No  . Sexual activity: Yes   Other Topics Concern  . None   Social History Narrative  . None     Family History:  The patient's family history includes CAD in his brother; CVA (age of onset: 37) in his mother; Cancer in his father, paternal uncle, and sister; Diabetes in his mother and sister; Heart attack in his daughter; Heart disease in his brother, daughter, mother, and sister;  Hyperlipidemia in his mother; Hypertension in his daughter, father, mother, and sister.   ROS:   Please see the history of present illness.    Review of Systems  Constitution: Positive for decreased appetite and weight loss.  HENT: Positive for headaches.   Cardiovascular: Positive for dyspnea on exertion, leg swelling and palpitations.  Respiratory: Positive for cough, shortness of breath and wheezing.   Endocrine: Negative.   Hematologic/Lymphatic: Negative.   Musculoskeletal: Negative.   Gastrointestinal: Negative.   Genitourinary: Negative.   Neurological: Positive for dizziness.   All other systems reviewed and are negative.   PHYSICAL EXAM:   VS:  BP (!) 98/44   Pulse 83   Ht 5\' 10"  (1.778 m)   Wt 152 lb 12.8 oz (69.3 kg)   SpO2 98%   BMI 21.92 kg/m   Physical Exam  GEN: Chronically ill-appearing 80 year old male, in no acute distress  Neck: no JVD, carotid bruits, or masses Cardiac:RRR; positive S4 2/6 systolic murmur at the left sternal border, no rubs Respiratory:  Decreased breath sounds at the right lung base otherwise clear GI: soft, nontender, nondistended, + BS Ext: Race of ankle edema without cyanosis, clubbing,  Good distal pulses bilaterally MS: no deformity or atrophy  Skin: warm and dry, no rash Psych: euthymic mood, full affect  Wt Readings from Last 3 Encounters:  05/03/16 152 lb 12.8 oz (69.3 kg)  04/24/16 175 lb (79.4 kg)  04/09/16 175 lb 7.8 oz (79.6 kg)      Studies/Labs Reviewed:   EKG:  EKG is  ordered today.  The ekg ordered today demonstrates Poor baseline tracing but looks to be normal sinus rhythm  Recent Labs: 02/02/2016: Magnesium 1.8 03/30/2016: TSH 12.02 04/09/2016:  ALT 8 04/24/2016: BUN 21; Creatinine, Ser 1.48; Hemoglobin 11.8; Platelets 168; Potassium 3.9; Sodium 144   Lipid Panel    Component Value Date/Time   CHOL 122 03/30/2016   TRIG 67 03/30/2016   HDL 51 03/30/2016   CHOLHDL 1.9 12/25/2011 0416   VLDL 8 12/25/2011  0416   LDLCALC 58 03/30/2016    Additional studies/ records that were reviewed today include:   Procedure: Transesophageal Echocardiogram Indications: Bacteremia   Procedure Details Consent: Obtained Time Out: Verified patient identification, verified procedure, site/side was marked, verified correct patient position, special equipment/implants available, Radiology Safety Procedures followed,  medications/allergies/relevent history reviewed, required imaging and test results available.  Performed   Medications: Fentanyl: 50 mcg Versed: 6 mg   - Left ventricle: Wall thickness was increased in a pattern of   moderate LVH. Systolic function was normal. The estimated   ejection fraction was in the range of 55% to 60%. Wall motion was   normal; there were no regional wall motion abnormalities. - Aortic valve: There was trivial regurgitation. - Aorta: Severe non-mobile atherosclerotic plaque. - Ascending aorta: The ascending aorta was normal in size. - Mitral valve: There was mild regurgitation. - Left atrium: The atrium was dilated. No evidence of thrombus in   the atrial cavity or appendage. - Right ventricle: Systolic function was normal. - Right atrium: No evidence of thrombus in the atrial cavity or   appendage. - Atrial septum: No defect or patent foramen ovale was identified. - Tricuspid valve: There was mild-moderate regurgitation.   Impressions:   - Tricuspid valve is significantly thickened, predominantly the   posterior leaflet. There is a mobile echodensity measuring 1.6 x   1.5 cm attached to the posterior leaflet suspicious for a   vegetation.     Complications: No apparent complications Patient did tolerate procedure well.   [name], MD, [facility] 04/12/2016, 12:40 PM   Impression:   1. Aortic valve: The aortic leaflets were thickened with mild calcification noted. There there was no restriction to opening and no aortic insufficiency noted.   2. Mitral  valve: The mitral leaflets are mildly thickened but opened without restriction. There was trace mitral insufficiency. No systolic anterior motion of the mitral valve was noted.   3. Left ventricle: There was severe left ventricular hypertrophy which was concentric. The anterior wall measured 1.37 cm and the posterior wall measured 1.45 cm at end diastole at the mid-papillary level in the transgastric short axis view. Left ventricular end-diastolic diameter measured 2.86 cm. There was near cavity obliteration in systole. The ejection fraction was estimated at 80%.   4. Right ventricle: There appeared to be adequate right ventricular systolic function. The pacemaker lead could be visualized. No clear vegetation could be identified.   5. Tricuspid valve: There was trace tricuspid insufficiency.   6. Interatrial septum: There was no evidence of atrial septal defect or patent foramen ovale by color Doppler. The right atrial pacemaker lead was identified. No clear vegetation could be identified.   7. Left atrium: There was no thrombus noted within the left atrium or left atrial appendage.   8. Ascending aorta: There was moderate atheromatous disease involving the ascending aorta. The ascending aorta was not dilated or aneurysmal. There was a well-defined sinotubular ridge and aortic root without effacement.   9. Descending aorta: The descending aorta was of normal diameter. There was scattered grade 2-3 atheromatous disease noted within the wall of the descending aorta.   10.Pericardial space: There was no fluid  noted within the pericardial space.         9.   - Left ventricle: Wall thickness was increased in a pattern of   moderate LVH. Systolic function was normal. The estimated   ejection fraction was in the range of 55% to 60%. Wall motion was   normal; there were no regional wall motion abnormalities. - Aortic valve: There was trivial regurgitation. - Aorta: Severe non-mobile  atherosclerotic plaque. - Ascending aorta: The ascending aorta was normal in size. - Mitral valve: There was mild regurgitation. - Left atrium: The atrium was dilated. No evidence of thrombus in   the atrial cavity or appendage. - Right ventricle: Systolic function was normal. - Right atrium: No evidence of thrombus in the atrial cavity or   appendage. - Atrial septum: No defect or patent foramen ovale was identified. - Tricuspid valve: There was mild-moderate regurgitation.   Impressions:   - Tricuspid valve is significantly thickened, predominantly the   posterior leaflet. There is a mobile echodensity measuring 1.6 x   1.5 cm attached to the posterior leaflet suspicious for a   vegetation.    ASSESSMENT:    1. Hypotension due to drugs   2. Chronic diastolic heart failure ([place])   3. Chronic kidney disease, stage III (moderate)   4. Essential hypertension   5. Staphylococcus aureus bacteremia   6. Endocarditis of tricuspid valve   7. History of pacemaker   8. PAF (paroxysmal atrial fibrillation) ([place])   9. CAD of autologous bypass graft      PLAN:  In order of problems listed above:  Hypotension patient's blood pressure has dropped dramatically and he is hypotensive since Lasix was increased to 120 mg twice a day. 23 pound weight loss in 5 days. He feels terrible. Will hold afternoon Lasix today and hold diuretics tomorrow. Have requested that home health reevaluate him on Wednesday for CHF. We may be a low to start his diuretics back on Wednesday. I will check renal function today. He will need to be seen in our office on Friday for reevaluation of heart failure.  Chronic diastolic heart failure compensated now over diuresed. Check and renal function today and holding diuretics this afternoon and tomorrow. Reevaluate on Friday with provider. Home health requested to follow as an outpatient.  Essential hypertension blood pressures has been quite high as an outpatient and in  the emergency room. Is very low today due to increase in diuretic therapy.  Staph bacteremia still on IV antibiotics  Bacterial endocarditis on IV antibiotics via PICC line for 6 weeks not felt to be a surgical candidate  Infected pacer leads, removed  PAF maintaining normal sinus rhythm on amiodarone  CKD check renal function today  CAD stable without angina       Medication Adjustments/Labs and Tests Ordered: Current medicines are reviewed at length with the patient today.  Concerns regarding medicines are outlined above.  Medication changes, Labs and Tests ordered today are listed in the Patient Instructions below. Patient Instructions  Medication Instructions:  Your physician has recommended you make the following change in your medication:  1. Hold lasix Monday, afternoon dose and Tuesday all day.   Could resume medication at (80 mg ) daily or (120 mg ) daily on Wednesday  according to weight gain, oxygen saturations, edema and  Home Health evaluation.    Labwork: Your physician recommends that you have lab work today: bmet   Testing/Procedures: -None  Follow-Up: Your physician recommends that you schedule  a follow-up appointment with Cecilie Kicks, NP on Friday, October 27 @ 3:00. Your physician recommends that you keep your scheduled  follow-up appointment with Dr. Tamala Julian.   Any Other Special Instructions Will Be Listed Below (If Applicable). Darlina Guys at [facility] was contacted today on patients behalf for visits 3 times weekly.  First visit is to be set for Wednesday, October 25.     If you need a refill on your cardiac medications before your next appointment, please call your pharmacy.      Signed, Ermalinda Barrios, PA-C  05/03/2016 1:06 PM    [facility] [address], [place], [place]  [address] Phone: [phone]; Fax: [phone]

## 2016-05-03 NOTE — Patient Instructions (Addendum)
Medication Instructions:  Your physician has recommended you make the following change in your medication:  1. Hold lasix Monday, afternoon dose and Tuesday all day.   Could resume medication at (80 mg ) daily or (120 mg ) daily on Wednesday  according to weight gain, oxygen saturations, edema and  Home Health evaluation.    Labwork: Your physician recommends that you have lab work today: bmet   Testing/Procedures: -None  Follow-Up: Your physician recommends that you schedule a follow-up appointment with Cecilie Kicks, NP on Friday, October 27 @ 3:00. Your physician recommends that you keep your scheduled  follow-up appointment with Dr. Tamala Julian.   Any Other Special Instructions Will Be Listed Below (If Applicable). Darlina Guys at [facility] was contacted today on patients behalf for visits 3 times weekly.  First visit is to be set for Wednesday, October 25.     If you need a refill on your cardiac medications before your next appointment, please call your pharmacy.

## 2016-05-04 LAB — BASIC METABOLIC PANEL
BUN: 28 mg/dL — ABNORMAL HIGH (ref 7–25)
CHLORIDE: 104 mmol/L (ref 98–110)
CO2: 24 mmol/L (ref 20–31)
CREATININE: 2.25 mg/dL — AB (ref 0.70–1.18)
Calcium: 8 mg/dL — ABNORMAL LOW (ref 8.6–10.3)
Glucose, Bld: 169 mg/dL — ABNORMAL HIGH (ref 65–99)
Potassium: 3.3 mmol/L — ABNORMAL LOW (ref 3.5–5.3)
SODIUM: 140 mmol/L (ref 135–146)

## 2016-05-06 ENCOUNTER — Telehealth: Payer: Self-pay | Admitting: *Deleted

## 2016-05-06 ENCOUNTER — Telehealth: Payer: Self-pay | Admitting: Pharmacist

## 2016-05-06 NOTE — Telephone Encounter (Signed)
Spoke with pt wife re: potassium low.  Advised pt to take 2 extra Potassium and we will recheck it tomorrow at his f/u appt, per Cecilie Kicks, NP.

## 2016-05-06 NOTE — Telephone Encounter (Signed)
Received [name] labs today and his SCr has increased dramatically - on 10/17 SCr was 1.68. Now SCr 2.24. K also low at 2.7, he does take chronic potassium replacement.  Seen by cardiologist on 10/23 with 23 lb weight loss in 5 days and hypotension due to high dose of lasix (120 mg BID). His increase in SCr and decrease in K is probably due to his lasix. I tried to call [name] today x 4 to tell him to take some extra potassium and to see how he was doing with no answers. He will be seen by cardiology again tomorrow, so will route this note to them as well.  Glucose also very low at 54.   I have placed a picture of his labs under the media section in epic today 10/26 labeled "BMET from 10/25".

## 2016-05-06 NOTE — Telephone Encounter (Signed)
[name] called to report low k+ on recent blood work. Advised we have been trying to get in touch with the patient. She has given a different number for the patient's wife, [phone]. Will make jennifer witty aware.

## 2016-05-06 NOTE — Telephone Encounter (Signed)
-----   Message from [name], PA-C sent at 05/05/2016  7:55 AM EDT ----- Kidney function up as suspected. Potassium low. Take kdur 20 meq 2 extra tablets today. Home health is supposed to evaluate today for edema, weight, and dizziness and low BP. Please call me as we need to decide if/when to restart diuretics. [name]

## 2016-05-07 ENCOUNTER — Encounter: Payer: Self-pay | Admitting: Cardiology

## 2016-05-07 ENCOUNTER — Ambulatory Visit (INDEPENDENT_AMBULATORY_CARE_PROVIDER_SITE_OTHER): Payer: Medicare Other | Admitting: Cardiology

## 2016-05-07 ENCOUNTER — Encounter: Payer: Self-pay | Admitting: Internal Medicine

## 2016-05-07 VITALS — BP 134/60 | HR 74 | Ht 70.0 in | Wt 162.1 lb

## 2016-05-07 DIAGNOSIS — I480 Paroxysmal atrial fibrillation: Secondary | ICD-10-CM | POA: Diagnosis not present

## 2016-05-07 DIAGNOSIS — I368 Other nonrheumatic tricuspid valve disorders: Secondary | ICD-10-CM

## 2016-05-07 DIAGNOSIS — I10 Essential (primary) hypertension: Secondary | ICD-10-CM

## 2016-05-07 DIAGNOSIS — N183 Chronic kidney disease, stage 3 unspecified: Secondary | ICD-10-CM

## 2016-05-07 DIAGNOSIS — I5032 Chronic diastolic (congestive) heart failure: Secondary | ICD-10-CM

## 2016-05-07 DIAGNOSIS — I079 Rheumatic tricuspid valve disease, unspecified: Secondary | ICD-10-CM

## 2016-05-07 DIAGNOSIS — I959 Hypotension, unspecified: Secondary | ICD-10-CM

## 2016-05-07 DIAGNOSIS — I495 Sick sinus syndrome: Secondary | ICD-10-CM | POA: Diagnosis not present

## 2016-05-07 LAB — BASIC METABOLIC PANEL
BUN: 39 mg/dL — ABNORMAL HIGH (ref 7–25)
CALCIUM: 8.4 mg/dL — AB (ref 8.6–10.3)
CHLORIDE: 103 mmol/L (ref 98–110)
CO2: 24 mmol/L (ref 20–31)
CREATININE: 2.33 mg/dL — AB (ref 0.70–1.18)
GLUCOSE: 173 mg/dL — AB (ref 65–99)
Potassium: 4.6 mmol/L (ref 3.5–5.3)
Sodium: 138 mmol/L (ref 135–146)

## 2016-05-07 NOTE — Progress Notes (Signed)
Cardiology Office Note   Date:  05/07/2016   ID:  [name] Marble Sr., DOB 1935/08/20, MRN [id number]  PCP:  [name], MD  Cardiologist:  Dr. Tamala Julian    Chief Complaint  Patient presents with  . Congestive Heart Failure    dehydration      History of Present Illness: Zyshaun Eiben. is a 80 y.o. male who presents for follow up from the 23rd for CHF and found to be hypotensive.  He has a hx of CAD status post CABG 1994 with subsequent stents and repeat CABG in 2013, hypertension, HLD, CKD, chronic diastolic heart failure, sinus bradycardia sp PPM (SJM) by Dr Leonia Reeves. Has PAF, CHADSVASC=5 but is not anticoagulated for atrial fibrillation due to prior GI bleed. He is on aspirin and Plavix. Saw Dr. Rayann Heman in 10/2015 to discuss watchman. Because he had no atrial fibrillation for the past 6 months conservative management was recommended. Amiodarone was continued.  Patient developed Staphylococcus areuos bacteremia 01/2016 and was found to have an echo density upon the atrial lead of his pacemaker. His ventricular pacemaker leads were extracted.  Patient was readmitted 04/12/16 with C. difficile colitis and hypertensive urgency with blood pressure of 237/97. He also had mild elevated troponins and 2-D echo showed a possible tricuspid vegetation which was confirmed on TEE. 3 sets of blood cultures were negative for growth. It's felt he had endocarditis from prior bacteremia. Thoracic surgery was consult it who recommended that he is not a candidate for surgical treatment given his age, multiple comorbidities and prior CABG and calcified vessels. Antibiotics were recommended for 6 weeks.  His PPM and leads removed due to vegetation on atrial lead. He had this for SSS.   Last seen by Ermalinda Barrios, PA for SOB and dizziness.  BP borderline.  Wt down 23 lbs.  Lasix was held and labs reveealed low K+ which is replaced, will recheck today.  Today he is still on IV ABX which will  continue until 05/26/16.  He has occ chest pain brief episodes.  Rare episodes of dizziness.  No rapid HR.  No palpitations. He is on lasix 80 mg daily and he takes KDUR 20 meq 1 in AM, 2 at lunch and 1 in evening.  He took 2 extra yesterday.    Pt's strength is slowly improving. He has PT at home.    Past Medical History:  Diagnosis Date  . Angiomyolipoma 2009   On both kidneys noted in 2009  . Arthritis    neck and left wrist  . Atrial fibrillation ([place])   . CAD (coronary artery disease)    s/b CABG 1994, and subsequent stents. Repeat CABG 12/2011,  . Chronic diastolic heart failure ([place])   . CKD (chronic kidney disease)   . Clostridium difficile diarrhea 03/2016  . Depressive disorder   . Diabetes mellitus type 2 with peripheral artery disease (HCC)    DIET CONTROLLED  . DVT (deep venous thrombosis) ([place]) 2011   Right arm  . Gastroparesis   . GERD (gastroesophageal reflux disease)   . Gout   . History of hiatal hernia   . Hyperlipidemia   . Hypertension   . Internal hemorrhoids without mention of complication   . Ischemic colitis ([place])   . 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 Myelodysplastic syndrome ([place]) 05/22/2013   With low hemoglobin and platelets treated with Procrit  . Osteopenia   . Peptic ulcer    S/p partial gastrectomy in 1969  .  Peripheral artery disease ([place])   . Pneumonia 01/16/2016  . Pneumonia 03/2016  . Presence of permanent cardiac pacemaker   . Prostate cancer ([place]) 1997   XRT and lupron  . Renal artery stenosis ([place])   . Sick sinus syndrome ([place])   . Vitamin B 12 deficiency     Past Surgical History:  Procedure Laterality Date  . ABDOMINAL ANGIOGRAM N/A 05/25/2011   Procedure: ABDOMINAL ANGIOGRAM;  Surgeon: Serafina Mitchell, MD;  Location: [facility] CATH LAB;  Service: Cardiovascular;  Laterality: N/A;  . ABDOMINAL ANGIOGRAM N/A 02/13/2013   Procedure: ABDOMINAL ANGIOGRAM;  Surgeon: Serafina Mitchell, MD;  Location: [facility] CATH LAB;  Service: Cardiovascular;   Laterality: N/A;  . APPENDECTOMY  1991  . CELIAC ARTERY ANGIOPLASTY  05-16-12   and stenting  . CHOLECYSTECTOMY  Oct 2009   Laparoscopic  . CORONARY ARTERY BYPASS GRAFT  01/22/1993  . CORONARY ARTERY BYPASS GRAFT  01/03/2012   Procedure: REDO CORONARY ARTERY BYPASS GRAFTING (CABG);  Surgeon: Gaye Pollack, MD;  Location: [place];  Service: Open Heart Surgery;  Laterality: N/A;  Redo CABG x  using bilateral internal mammary arteries;  left leg greater saphenous vein harvested endoscopically  . FOREIGN BODY REMOVAL ABDOMINAL Right 01/27/2016   Procedure: EXPOSURE OF RIGHT COMMON FEMORAL ARTERY AND REMOVAL FOREIGN;  Surgeon: Evans Lance, MD;  Location: [place];  Service: Cardiovascular;  Laterality: Right;  . HIATAL HERNIA REPAIR     and ulcer repair  . I&D EXTREMITY Left 01/21/2016   Procedure: IRRIGATION AND DEBRIDEMENT EXTREMITY;  Surgeon: Milly Jakob, MD;  Location: [place];  Service: Orthopedics;  Laterality: Left;  . INGUINAL HERNIA REPAIR Right 10/28/2015   Procedure: OPEN RIGHT INGUINAL HERNIA REPAIR;  Surgeon: Greer Pickerel, MD;  Location: [facility];  Service: General;  Laterality: Right;  . INSERTION OF MESH Right 10/28/2015   Procedure: INSERTION OF MESH;  Surgeon: Greer Pickerel, MD;  Location: [facility];  Service: General;  Laterality: Right;  . IR GENERIC HISTORICAL  02/02/2016   IR FLUORO GUIDE CV LINE LEFT 02/02/2016 Markus Daft, MD [facility]  . IR GENERIC HISTORICAL  02/02/2016   IR US GUIDE VASC ACCESS LEFT 02/02/2016 Markus Daft, MD [facility]  . IR GENERIC HISTORICAL  03/31/2016   IR REMOVAL TUN CV CATH W/O FL 03/31/2016 Arne Cleveland, MD [facility]  . LEFT HEART CATHETERIZATION WITH CORONARY/GRAFT ANGIOGRAM  12/24/2011   Procedure: LEFT HEART CATHETERIZATION WITH Beatrix Fetters;  Surgeon: Sinclair Grooms, MD;  Location: [facility] CATH LAB;  Service: Cardiovascular;;  . LOWER EXTREMITY ANGIOGRAM Bilateral 05/25/2011   Procedure: LOWER EXTREMITY ANGIOGRAM;  Surgeon: Serafina Mitchell,  MD;  Location: [facility] CATH LAB;  Service: Cardiovascular;  Laterality: Bilateral;  . OTHER SURGICAL HISTORY  02/13/13   superior mesenteric artery angiogram  . OTHER SURGICAL HISTORY  05/16/12   Stent in stomach  . PACEMAKER GENERATOR CHANGE  12/10/2003   SJM Identity XL DR performed by Dr Leonia Reeves  . PACEMAKER INSERTION  10/18/1994   DDD pacemaker, [employer]. Gen change 12/10/2003.  Marland Kitchen PACEMAKER LEAD REMOVAL Right 01/27/2016   Procedure: PACEMAKER EXTRACTION;  Surgeon: Evans Lance, MD;  Location: [place];  Service: Cardiovascular;  Laterality: Right;  DR. Roxy Manns TO BACKUP CASE  . PARTIAL GASTRECTOMY  1969   Hx of ulcer s/p partial gastrectomy/ has pernicious anemia  . RENAL ANGIOGRAM N/A 02/13/2013   Procedure: RENAL ANGIOGRAM;  Surgeon: Serafina Mitchell, MD;  Location: [facility] CATH LAB;  Service: Cardiovascular;  Laterality: N/A;  . TEE WITHOUT CARDIOVERSION N/A 01/26/2016   Procedure: TRANSESOPHAGEAL ECHOCARDIOGRAM (TEE);  Surgeon: Sueanne Margarita, MD;  Location: [facility] ENDOSCOPY;  Service: Cardiovascular;  Laterality: N/A;  . TEE WITHOUT CARDIOVERSION N/A 01/27/2016   Procedure: TRANSESOPHAGEAL ECHOCARDIOGRAM (TEE);  Surgeon: Evans Lance, MD;  Location: [place];  Service: Cardiovascular;  Laterality: N/A;  . TEE WITHOUT CARDIOVERSION N/A 04/12/2016   Procedure: TRANSESOPHAGEAL ECHOCARDIOGRAM (TEE);  Surgeon: Dorothy Spark, MD;  Location: [place];  Service: Cardiovascular;  Laterality: N/A;  . THROMBECTOMY / EMBOLECTOMY SUBCLAVIAN ARTERY  02/02/10   Right subclavian thromboectomy and venous angioplasty, and chronic mesenteric ischemia with Herculink stenting to superior mesenteric and celiac arteries - Dr. Trula Slade  . TRANSURETHRAL RESECTION OF PROSTATE N/A 02/22/2016   Procedure: CYSTO, CLOT EVACUATION, FULGERATION;  Surgeon: Alexis Frock, MD;  [facility];  Service: Urology;  Laterality: N/A;  . VISCERAL ANGIOGRAM N/A 05/25/2011   Procedure: VISCERAL ANGIOGRAM;  Surgeon: Serafina Mitchell, MD;  Location:  [facility] CATH LAB;  Service: Cardiovascular;  Laterality: N/A;  . VISCERAL ANGIOGRAM Bilateral 12/28/2011   Procedure: VISCERAL ANGIOGRAM;  Surgeon: Serafina Mitchell, MD;  Location: [facility] CATH LAB;  Service: Cardiovascular;  Laterality: Bilateral;  . VISCERAL ANGIOGRAM N/A 05/16/2012   Procedure: VISCERAL ANGIOGRAM;  Surgeon: Serafina Mitchell, MD;  Location: [facility] CATH LAB;  Service: Cardiovascular;  Laterality: N/A;  . VISCERAL ANGIOGRAM N/A 02/13/2013   Procedure: VISCERAL ANGIOGRAM;  Surgeon: Serafina Mitchell, MD;  Location: [facility] CATH LAB;  Service: Cardiovascular;  Laterality: N/A;     Current Outpatient Prescriptions  Medication Sig Dispense Refill  . acetaminophen (ARTHRITIS PAIN RELIEF) 650 MG CR tablet Take 650 mg by mouth 3 (three) times daily.     Marland Kitchen amiodarone (PACERONE) 200 MG tablet Take one-half tablet by  mouth daily 45 tablet 2  . atorvastatin (LIPITOR) 80 MG tablet Take 80 mg by mouth at bedtime.     . Calcifediol ER (RAYALDEE) 30 MCG CPCR Take 30 mcg by mouth at bedtime.    . carvedilol (COREG) 25 MG tablet Take 1 tablet (25 mg total) by mouth 2 (two) times daily with a meal. 60 tablet 0  . ceFAZolin (ANCEF) 1 GM/50ML Inject 50 mLs (1 g total) into the vein every 12 (twelve) hours. 1500 mL 0  . cyanocobalamin (,VITAMIN B-12,) 1000 MCG/ML injection Inject 1,000 mcg into the muscle every 30 (thirty) days. Vitamin B12 - last injection 09/01/15    . epoetin alfa (EPOGEN,PROCRIT) 29562 UNIT/ML injection Inject 10,000 Units into the skin every 14 (fourteen) days. Done at [facility] - last injection 09/22/15    . ferrous sulfate 325 (65 FE) MG tablet Take 1 tablet (325 mg total) by mouth 3 (three) times daily with meals.  3  . furosemide (LASIX) 80 MG tablet Take 1 tablet by mouth as needed for fluid retention and edema    . hydrALAZINE (APRESOLINE) 25 MG tablet Take 3 tablets (75 mg total) by mouth 4 (four) times daily.    . insulin aspart (NOVOLOG) 100 UNIT/ML injection Inject 0-9 Units into the  skin. SSI:  121-150 = 1 unit, 151-200 = 2 units, 201-250 = 3 units, 251-300 = 5 units, 301-350 = 7 units, 351-400 = 9 units    . isosorbide mononitrate (IMDUR) 120 MG 24 hr tablet Take 1 tablet by mouth  daily 90 tablet 2  . leuprolide (LUPRON) 30 MG injection Inject 30 mg into the muscle every 4 (  four) months.    . Multiple Vitamin (MULTIVITAMIN WITH MINERALS) TABS tablet Take 1 tablet by mouth every morning.    . potassium chloride SA (K-DUR,KLOR-CON) 20 MEQ tablet Take 20-40 mEq by mouth 2 (two) times daily. Give 40 mEq BID and 20 mEq qhs    . senna-docusate (SENOKOT-S) 8.6-50 MG tablet Take 2 tablets by mouth 2 (two) times daily.    . silodosin (RAPAFLO) 8 MG CAPS capsule Take 8 mg by mouth at bedtime.     . sodium bicarbonate 650 MG tablet Take 1 tablet (650 mg total) by mouth 2 (two) times daily.    Marland Kitchen UNABLE TO FIND Med Name: Med pass 120 mL 2 times daily     No current facility-administered medications for this visit.     Allergies:   Nsaids; Ibuprofen; and Ace inhibitors    Social History:  The patient  reports that he quit smoking about 46 years ago. His smoking use included Cigarettes. He quit after 20.00 years of use. He has never used smokeless tobacco. He reports that he does not drink alcohol or use drugs.   Family History:  The patient's family history includes CAD in his brother; CVA (age of onset: 30) in his mother; Cancer in his father, paternal uncle, and sister; Diabetes in his mother and sister; Heart attack in his daughter; Heart disease in his brother, daughter, mother, and sister; Hyperlipidemia in his mother; Hypertension in his daughter, father, mother, and sister.    ROS:  General:no colds or fevers, + weight up 10 lbs Skin:no rashes or ulcers HEENT:no blurred vision, no congestion CV:see HPI PUL:see HPI GI:no diarrhea constipation or melena, no indigestion GU:no hematuria, no dysuria MS:no joint pain, no claudication Neuro:no syncope, 0cc  lightheadedness Endo:+ diabetes, no thyroid disease  Wt Readings from Last 3 Encounters:  05/07/16 162 lb 1.9 oz (73.5 kg)  05/03/16 152 lb 12.8 oz (69.3 kg)  04/24/16 175 lb (79.4 kg)     PHYSICAL EXAM: VS:  BP 134/60   Pulse 74   Ht 5\' 10"  (1.778 m)   Wt 162 lb 1.9 oz (73.5 kg)   SpO2 97%   BMI 23.26 kg/m  , BMI Body mass index is 23.26 kg/m. General:Pleasant affect, NAD Skin:Warm and dry, brisk capillary refill HEENT:normocephalic, sclera clear, mucus membranes moist Neck:supple, no JVD sitting up right, no bruits  Heart:S1S2 RRR without murmur, gallup, rub or click, occ premature beats Lungs:clear without rales, rhonchi, or wheezes [id number], non tender, + BS, do not palpate liver spleen or masses Ext:1-2+ lower ext edema, from above the sock to knees  2+ radial pulses Neuro:alert and oriented X 3, MAE, follows commands, + facial symmetry    EKG:  EKG is NOT ordered today.   Recent Labs: 02/02/2016: Magnesium 1.8 03/30/2016: TSH 12.02 04/09/2016: ALT 8 04/24/2016: Hemoglobin 11.8; Platelets 168 05/03/2016: BUN 28; Creat 2.25; Potassium 3.3; Sodium 140    Lipid Panel    Component Value Date/Time   CHOL 122 03/30/2016   TRIG 67 03/30/2016   HDL 51 03/30/2016   CHOLHDL 1.9 12/25/2011 0416   VLDL 8 12/25/2011 0416   LDLCALC 58 03/30/2016       Other studies Reviewed: Additional studies/ records that were reviewed today include: . TEE 04/12/16  Left ventricle: Wall thickness was increased in a pattern of moderate LVH. Systolic function was normal. The estimated ejection fraction was in the range of 55% to 60%. Wall motion was normal; there were no regional wall motion  abnormalities. - Aortic valve: There was trivial regurgitation. - Aorta: Severe non-mobile atherosclerotic plaque. - Ascending aorta: The ascending aorta was normal in size. - Mitral valve: There was mild regurgitation. - Left atrium: The atrium was dilated. No evidence of thrombus  in the atrial cavity or appendage. - Right ventricle: Systolic function was normal. - Right atrium: No evidence of thrombus in the atrial cavity or appendage. - Atrial septum: No defect or patent foramen ovale was identified. - Tricuspid valve: There was mild-moderate regurgitation.  Impressions:  - Tricuspid valve is significantly thickened, predominantly the posterior leaflet. There is a mobile echodensity measuring 1.6 x 1.5 cm attached to the posterior leaflet suspicious for a vegetation.    ASSESSMENT AND PLAN:  1.  Hypotension- resolved holding lasix. Has followup with Dr. Tamala Julian in Nov.  Will hold off on another visit unless increased SOB.   2.  Chronic diastolic HF occ SOB edema returning - lasix 80 mg daily and check stat BMP today to check Cr. Most likely will need higher dose of lasix. Was discharged on lasix 80 once daily. If Cr improved will increase the lasix to BID  3.. Essential HTN  Staph bacteremia on IV ABX  4.  PAF maintaining SR   5.  CKD check renal function today  6.  CAD stable without angina  7    Bacterial endocarditis on IV antibiotics via PICC line for 6 weeks not felt to be a surgical candidate  8. + vegetation on pacing lead with PPM extraction, required vascular surgery to retrieve RV lead tip from RFV --no plans for new PPM at this time hx of SSS PAF. CHA2DS2Vasc is at least 4, not on a/c with hx of GIB .    Current medicines are reviewed with the patient today.  The patient Has no concerns regarding medicines.  The following changes have been made:  See above Labs/ tests ordered today include:see above  Disposition:   FU:  see above  Signed, Cecilie Kicks, NP  05/07/2016 4:41 PM    [facility] [place], [place], [place] Phone: [phone]; Fax: [phone]

## 2016-05-07 NOTE — Patient Instructions (Signed)
Medication Instructions:  Resume Lasix (Furosemide) at 80 mg daily. All other medications remain the same.  Labwork: Today-Stat BMET  Testing/Procedures: None  Follow-Up: As already planned     If you need a refill on your cardiac medications before your next appointment, please call your pharmacy.

## 2016-05-07 NOTE — Telephone Encounter (Signed)
Thank you Ms Eber Hong I appreciate your thoroughness

## 2016-05-08 ENCOUNTER — Telehealth: Payer: Self-pay | Admitting: Cardiology

## 2016-05-08 NOTE — Telephone Encounter (Signed)
Have tried to reach [name] to change his lasix to 80 mg every other day and his K+ to total of 40 meq on days he takes lasix only.  Will have home health check labs Tuesday.  He should call if increased swelling , wt gain or SOB. I did leave message.

## 2016-05-10 ENCOUNTER — Ambulatory Visit: Payer: Medicare Other

## 2016-05-10 ENCOUNTER — Other Ambulatory Visit (HOSPITAL_BASED_OUTPATIENT_CLINIC_OR_DEPARTMENT_OTHER): Payer: Medicare Other

## 2016-05-10 ENCOUNTER — Ambulatory Visit (HOSPITAL_BASED_OUTPATIENT_CLINIC_OR_DEPARTMENT_OTHER): Payer: Medicare Other

## 2016-05-10 ENCOUNTER — Telehealth: Payer: Self-pay | Admitting: *Deleted

## 2016-05-10 DIAGNOSIS — D469 Myelodysplastic syndrome, unspecified: Secondary | ICD-10-CM

## 2016-05-10 DIAGNOSIS — Z452 Encounter for adjustment and management of vascular access device: Secondary | ICD-10-CM

## 2016-05-10 LAB — CBC & DIFF AND RETIC
BASO%: 0.4 % (ref 0.0–2.0)
Basophils Absolute: 0 10*3/uL (ref 0.0–0.1)
EOS%: 1.4 % (ref 0.0–7.0)
Eosinophils Absolute: 0.1 10*3/uL (ref 0.0–0.5)
HCT: 34.2 % — ABNORMAL LOW (ref 38.4–49.9)
HGB: 10.9 g/dL — ABNORMAL LOW (ref 13.0–17.1)
Immature Retic Fract: 4.7 % (ref 3.00–10.60)
LYMPH#: 1.2 10*3/uL (ref 0.9–3.3)
LYMPH%: 20.7 % (ref 14.0–49.0)
MCH: 30.1 pg (ref 27.2–33.4)
MCHC: 31.9 g/dL — AB (ref 32.0–36.0)
MCV: 94.5 fL (ref 79.3–98.0)
MONO#: 0.4 10*3/uL (ref 0.1–0.9)
MONO%: 7.6 % (ref 0.0–14.0)
NEUT%: 69.9 % (ref 39.0–75.0)
NEUTROS ABS: 3.9 10*3/uL (ref 1.5–6.5)
Platelets: 175 10*3/uL (ref 140–400)
RBC: 3.62 10*6/uL — AB (ref 4.20–5.82)
RDW: 19.2 % — AB (ref 11.0–14.6)
RETIC %: 0.86 % (ref 0.80–1.80)
Retic Ct Abs: 31.13 10*3/uL — ABNORMAL LOW (ref 34.80–93.90)
WBC: 5.6 10*3/uL (ref 4.0–10.3)

## 2016-05-10 MED ORDER — FUROSEMIDE 80 MG PO TABS: 80.0000 mg | ORAL_TABLET | ORAL | 3 refills | Status: DC

## 2016-05-10 MED ORDER — POTASSIUM CHLORIDE CRYS ER 20 MEQ PO TBCR: EXTENDED_RELEASE_TABLET | ORAL | 1 refills | Status: DC

## 2016-05-10 MED ORDER — SODIUM CHLORIDE 0.9% FLUSH
10.0000 mL | INTRAVENOUS | Status: DC | PRN
  Filled 2016-05-10: qty 10

## 2016-05-10 MED ORDER — HEPARIN SOD (PORK) LOCK FLUSH 100 UNIT/ML IV SOLN
250.0000 [IU] | Freq: Once | INTRAVENOUS | Status: AC
  Administered 2016-05-10: 250 [IU] via INTRAVENOUS
  Filled 2016-05-10: qty 5

## 2016-05-10 MED ORDER — SODIUM CHLORIDE 0.9 % IJ SOLN
10.0000 mL | Freq: Once | INTRAMUSCULAR | Status: AC
  Administered 2016-05-10: 10 mL via INTRAVENOUS
  Filled 2016-05-10: qty 10

## 2016-05-10 NOTE — Progress Notes (Signed)
Pt does not meet parameters for Aranesp. Hgb 10.9.

## 2016-05-10 NOTE — Addendum Note (Signed)
Addended by: Gaetano Net on: 05/10/2016 10:43 AM   Modules accepted: Orders

## 2016-05-10 NOTE — Telephone Encounter (Signed)
Pt wife, [name], DPR on file, has been made aware of pts lab results and medication changes. She gave me the # to [facility] Nurse, Anderson Malta, and I called an spoke with her to make sure she is aware of the med change, since she helps pt get meds together, and to let her know we need to recheck bmet on Wednesday ( she had already done lab work today). Anderson Malta agreed to do lab work and she will fax results to me, [phone]. Med change in computer.

## 2016-05-10 NOTE — Telephone Encounter (Signed)
-----   Message from Isaiah Serge, NP sent at 05/08/2016 12:49 PM EDT ----- Please have pt change lasix to 80 mg every other day, and KDUR to 20 meq BID every other day with the lasix.  BMP with home health on Tuesday.  Thanks.  Kidney function a little worse.  Pleas ask them to call for increased swelling, wt gain or SOB.

## 2016-05-14 ENCOUNTER — Encounter: Payer: Self-pay | Admitting: Infectious Diseases

## 2016-05-23 ENCOUNTER — Encounter: Payer: Self-pay | Admitting: Infectious Diseases

## 2016-05-25 ENCOUNTER — Ambulatory Visit: Payer: Medicare Other | Admitting: Physician Assistant

## 2016-05-25 ENCOUNTER — Encounter (HOSPITAL_COMMUNITY): Payer: Self-pay | Admitting: Emergency Medicine

## 2016-05-25 ENCOUNTER — Telehealth: Payer: Self-pay | Admitting: Interventional Cardiology

## 2016-05-25 ENCOUNTER — Inpatient Hospital Stay (HOSPITAL_COMMUNITY)
Admission: EM | Admit: 2016-05-25 | Discharge: 2016-06-03 | DRG: 299 | Disposition: A | Discharge status: Home Health | Payer: Medicare Other | Attending: Family Medicine | Admitting: Family Medicine

## 2016-05-25 ENCOUNTER — Emergency Department (HOSPITAL_COMMUNITY): Payer: Medicare Other

## 2016-05-25 DIAGNOSIS — Z8249 Family history of ischemic heart disease and other diseases of the circulatory system: Secondary | ICD-10-CM

## 2016-05-25 DIAGNOSIS — M8580 Other specified disorders of bone density and structure, unspecified site: Secondary | ICD-10-CM | POA: Diagnosis present

## 2016-05-25 DIAGNOSIS — Z87891 Personal history of nicotine dependence: Secondary | ICD-10-CM

## 2016-05-25 DIAGNOSIS — E1151 Type 2 diabetes mellitus with diabetic peripheral angiopathy without gangrene: Secondary | ICD-10-CM | POA: Diagnosis present

## 2016-05-25 DIAGNOSIS — M19032 Primary osteoarthritis, left wrist: Secondary | ICD-10-CM | POA: Diagnosis present

## 2016-05-25 DIAGNOSIS — N184 Chronic kidney disease, stage 4 (severe): Secondary | ICD-10-CM | POA: Diagnosis present

## 2016-05-25 DIAGNOSIS — N390 Urinary tract infection, site not specified: Secondary | ICD-10-CM | POA: Diagnosis present

## 2016-05-25 DIAGNOSIS — I2510 Atherosclerotic heart disease of native coronary artery without angina pectoris: Secondary | ICD-10-CM | POA: Diagnosis present

## 2016-05-25 DIAGNOSIS — E785 Hyperlipidemia, unspecified: Secondary | ICD-10-CM | POA: Diagnosis present

## 2016-05-25 DIAGNOSIS — N179 Acute kidney failure, unspecified: Secondary | ICD-10-CM | POA: Diagnosis present

## 2016-05-25 DIAGNOSIS — I472 Ventricular tachycardia: Secondary | ICD-10-CM | POA: Diagnosis present

## 2016-05-25 DIAGNOSIS — E1122 Type 2 diabetes mellitus with diabetic chronic kidney disease: Secondary | ICD-10-CM | POA: Diagnosis present

## 2016-05-25 DIAGNOSIS — Z923 Personal history of irradiation: Secondary | ICD-10-CM

## 2016-05-25 DIAGNOSIS — E871 Hypo-osmolality and hyponatremia: Secondary | ICD-10-CM | POA: Diagnosis present

## 2016-05-25 DIAGNOSIS — E1143 Type 2 diabetes mellitus with diabetic autonomic (poly)neuropathy: Secondary | ICD-10-CM | POA: Diagnosis present

## 2016-05-25 DIAGNOSIS — B965 Pseudomonas (aeruginosa) (mallei) (pseudomallei) as the cause of diseases classified elsewhere: Secondary | ICD-10-CM | POA: Diagnosis present

## 2016-05-25 DIAGNOSIS — I5033 Acute on chronic diastolic (congestive) heart failure: Secondary | ICD-10-CM | POA: Diagnosis present

## 2016-05-25 DIAGNOSIS — E119 Type 2 diabetes mellitus without complications: Secondary | ICD-10-CM

## 2016-05-25 DIAGNOSIS — A0472 Enterocolitis due to Clostridium difficile, not specified as recurrent: Secondary | ICD-10-CM | POA: Diagnosis present

## 2016-05-25 DIAGNOSIS — K219 Gastro-esophageal reflux disease without esophagitis: Secondary | ICD-10-CM | POA: Diagnosis present

## 2016-05-25 DIAGNOSIS — I480 Paroxysmal atrial fibrillation: Secondary | ICD-10-CM | POA: Diagnosis present

## 2016-05-25 DIAGNOSIS — D72829 Elevated white blood cell count, unspecified: Secondary | ICD-10-CM | POA: Diagnosis not present

## 2016-05-25 DIAGNOSIS — D469 Myelodysplastic syndrome, unspecified: Secondary | ICD-10-CM | POA: Diagnosis present

## 2016-05-25 DIAGNOSIS — Z66 Do not resuscitate: Secondary | ICD-10-CM | POA: Diagnosis present

## 2016-05-25 DIAGNOSIS — Z951 Presence of aortocoronary bypass graft: Secondary | ICD-10-CM

## 2016-05-25 DIAGNOSIS — E039 Hypothyroidism, unspecified: Secondary | ICD-10-CM | POA: Diagnosis present

## 2016-05-25 DIAGNOSIS — K3184 Gastroparesis: Secondary | ICD-10-CM | POA: Diagnosis present

## 2016-05-25 DIAGNOSIS — I130 Hypertensive heart and chronic kidney disease with heart failure and stage 1 through stage 4 chronic kidney disease, or unspecified chronic kidney disease: Secondary | ICD-10-CM | POA: Diagnosis present

## 2016-05-25 DIAGNOSIS — I10 Essential (primary) hypertension: Secondary | ICD-10-CM | POA: Diagnosis present

## 2016-05-25 DIAGNOSIS — D638 Anemia in other chronic diseases classified elsewhere: Secondary | ICD-10-CM | POA: Diagnosis present

## 2016-05-25 DIAGNOSIS — M479 Spondylosis, unspecified: Secondary | ICD-10-CM | POA: Diagnosis present

## 2016-05-25 DIAGNOSIS — I5032 Chronic diastolic (congestive) heart failure: Secondary | ICD-10-CM | POA: Diagnosis not present

## 2016-05-25 DIAGNOSIS — L89152 Pressure ulcer of sacral region, stage 2: Secondary | ICD-10-CM | POA: Diagnosis present

## 2016-05-25 DIAGNOSIS — I4729 Other ventricular tachycardia: Secondary | ICD-10-CM

## 2016-05-25 DIAGNOSIS — E876 Hypokalemia: Secondary | ICD-10-CM | POA: Diagnosis not present

## 2016-05-25 DIAGNOSIS — Z950 Presence of cardiac pacemaker: Secondary | ICD-10-CM | POA: Diagnosis present

## 2016-05-25 DIAGNOSIS — Z888 Allergy status to other drugs, medicaments and biological substances status: Secondary | ICD-10-CM

## 2016-05-25 DIAGNOSIS — I739 Peripheral vascular disease, unspecified: Secondary | ICD-10-CM | POA: Diagnosis present

## 2016-05-25 DIAGNOSIS — Z886 Allergy status to analgesic agent status: Secondary | ICD-10-CM

## 2016-05-25 DIAGNOSIS — Z8546 Personal history of malignant neoplasm of prostate: Secondary | ICD-10-CM

## 2016-05-25 DIAGNOSIS — Z823 Family history of stroke: Secondary | ICD-10-CM

## 2016-05-25 DIAGNOSIS — I82621 Acute embolism and thrombosis of deep veins of right upper extremity: Principal | ICD-10-CM | POA: Diagnosis present

## 2016-05-25 DIAGNOSIS — L8990 Pressure ulcer of unspecified site, unspecified stage: Secondary | ICD-10-CM | POA: Insufficient documentation

## 2016-05-25 DIAGNOSIS — M79601 Pain in right arm: Secondary | ICD-10-CM | POA: Diagnosis present

## 2016-05-25 DIAGNOSIS — Z833 Family history of diabetes mellitus: Secondary | ICD-10-CM

## 2016-05-25 LAB — CBC WITH DIFFERENTIAL/PLATELET
Basophils Absolute: 0 10*3/uL (ref 0.0–0.1)
Basophils Relative: 0 %
EOS ABS: 0 10*3/uL (ref 0.0–0.7)
EOS PCT: 0 %
HCT: 32.5 % — ABNORMAL LOW (ref 39.0–52.0)
Hemoglobin: 10.2 g/dL — ABNORMAL LOW (ref 13.0–17.0)
LYMPHS ABS: 1.3 10*3/uL (ref 0.7–4.0)
Lymphocytes Relative: 14 %
MCH: 29.3 pg (ref 26.0–34.0)
MCHC: 31.4 g/dL (ref 30.0–36.0)
MCV: 93.4 fL (ref 78.0–100.0)
MONOS PCT: 11 %
Monocytes Absolute: 1 10*3/uL (ref 0.1–1.0)
Neutro Abs: 6.8 10*3/uL (ref 1.7–7.7)
Neutrophils Relative %: 75 %
PLATELETS: 133 10*3/uL — AB (ref 150–400)
RBC: 3.48 MIL/uL — AB (ref 4.22–5.81)
RDW: 18.8 % — AB (ref 11.5–15.5)
WBC: 9 10*3/uL (ref 4.0–10.5)

## 2016-05-25 LAB — BASIC METABOLIC PANEL
Anion gap: 6 (ref 5–15)
BUN: 47 mg/dL — AB (ref 6–20)
CALCIUM: 8.6 mg/dL — AB (ref 8.9–10.3)
CO2: 23 mmol/L (ref 22–32)
CREATININE: 2.38 mg/dL — AB (ref 0.61–1.24)
Chloride: 107 mmol/L (ref 101–111)
GFR calc Af Amer: 28 mL/min — ABNORMAL LOW (ref >60)
GFR, EST NON AFRICAN AMERICAN: 25 mL/min — AB (ref >60)
Glucose, Bld: 159 mg/dL — ABNORMAL HIGH (ref 65–99)
Potassium: 4.2 mmol/L (ref 3.5–5.1)
SODIUM: 136 mmol/L (ref 135–145)

## 2016-05-25 LAB — PROTIME-INR
INR: 1.1
PROTHROMBIN TIME: 14.2 s (ref 11.4–15.2)

## 2016-05-25 LAB — APTT: APTT: 39 s — AB (ref 24–36)

## 2016-05-25 MED ORDER — HEPARIN (PORCINE) IN NACL 100-0.45 UNIT/ML-% IJ SOLN
1200.0000 [IU]/h | INTRAMUSCULAR | Status: AC
  Administered 2016-05-25 – 2016-06-01 (×9): 1200 [IU]/h via INTRAVENOUS
  Filled 2016-05-25 (×13): qty 250

## 2016-05-25 MED ORDER — POTASSIUM CHLORIDE CRYS ER 20 MEQ PO TBCR
20.0000 meq | EXTENDED_RELEASE_TABLET | Freq: Every day | ORAL | Status: DC
  Administered 2016-05-27 – 2016-05-28 (×2): 20 meq via ORAL
  Filled 2016-05-25 (×2): qty 1

## 2016-05-25 MED ORDER — HYDRALAZINE HCL 50 MG PO TABS
75.0000 mg | ORAL_TABLET | Freq: Four times a day (QID) | ORAL | Status: DC
  Administered 2016-05-25 – 2016-06-03 (×32): 75 mg via ORAL
  Filled 2016-05-25 (×33): qty 1

## 2016-05-25 MED ORDER — SENNOSIDES-DOCUSATE SODIUM 8.6-50 MG PO TABS
2.0000 | ORAL_TABLET | Freq: Two times a day (BID) | ORAL | Status: DC
  Administered 2016-05-25 – 2016-05-29 (×7): 2 via ORAL
  Filled 2016-05-25 (×8): qty 2

## 2016-05-25 MED ORDER — GLUCERNA 1.2 CAL PO LIQD
237.0000 mL | Freq: Two times a day (BID) | ORAL | Status: DC | PRN
  Filled 2016-05-25: qty 237

## 2016-05-25 MED ORDER — ADULT MULTIVITAMIN W/MINERALS CH
1.0000 | ORAL_TABLET | Freq: Every morning | ORAL | Status: DC
  Administered 2016-05-26 – 2016-06-03 (×9): 1 via ORAL
  Filled 2016-05-25 (×9): qty 1

## 2016-05-25 MED ORDER — ATORVASTATIN CALCIUM 40 MG PO TABS
80.0000 mg | ORAL_TABLET | Freq: Every day | ORAL | Status: DC
  Administered 2016-05-25 – 2016-06-02 (×9): 80 mg via ORAL
  Filled 2016-05-25 (×9): qty 2

## 2016-05-25 MED ORDER — HEPARIN BOLUS VIA INFUSION
3000.0000 [IU] | Freq: Once | INTRAVENOUS | Status: AC
  Administered 2016-05-25: 3000 [IU] via INTRAVENOUS
  Filled 2016-05-25: qty 3000

## 2016-05-25 MED ORDER — ACETAMINOPHEN 325 MG PO TABS
650.0000 mg | ORAL_TABLET | Freq: Two times a day (BID) | ORAL | Status: DC
  Administered 2016-05-25 – 2016-06-03 (×18): 650 mg via ORAL
  Filled 2016-05-25 (×18): qty 2

## 2016-05-25 MED ORDER — INSULIN ASPART 100 UNIT/ML ~~LOC~~ SOLN
0.0000 [IU] | Freq: Three times a day (TID) | SUBCUTANEOUS | Status: DC
  Administered 2016-05-26: 1 [IU] via SUBCUTANEOUS
  Administered 2016-05-26 – 2016-05-27 (×2): 2 [IU] via SUBCUTANEOUS
  Administered 2016-05-27: 1 [IU] via SUBCUTANEOUS
  Administered 2016-05-28: 2 [IU] via SUBCUTANEOUS
  Administered 2016-05-28 – 2016-05-29 (×3): 1 [IU] via SUBCUTANEOUS
  Administered 2016-05-30: 2 [IU] via SUBCUTANEOUS
  Administered 2016-05-30: 1 [IU] via SUBCUTANEOUS
  Administered 2016-05-31: 2 [IU] via SUBCUTANEOUS
  Administered 2016-05-31: 1 [IU] via SUBCUTANEOUS
  Administered 2016-05-31 – 2016-06-01 (×3): 2 [IU] via SUBCUTANEOUS
  Administered 2016-06-02: 3 [IU] via SUBCUTANEOUS
  Administered 2016-06-02: 1 [IU] via SUBCUTANEOUS

## 2016-05-25 MED ORDER — HEPARIN BOLUS VIA INFUSION
4000.0000 [IU] | Freq: Once | INTRAVENOUS | Status: DC
  Filled 2016-05-25: qty 4000

## 2016-05-25 MED ORDER — CARVEDILOL 25 MG PO TABS
25.0000 mg | ORAL_TABLET | Freq: Two times a day (BID) | ORAL | Status: DC
Start: 2016-05-26 — End: 2016-06-03
  Administered 2016-05-26 – 2016-06-03 (×17): 25 mg via ORAL
  Filled 2016-05-25 (×17): qty 1

## 2016-05-25 MED ORDER — AMIODARONE HCL 100 MG PO TABS
100.0000 mg | ORAL_TABLET | Freq: Every day | ORAL | Status: DC
  Administered 2016-05-26 – 2016-06-03 (×9): 100 mg via ORAL
  Filled 2016-05-25 (×9): qty 1

## 2016-05-25 MED ORDER — ACETAMINOPHEN 650 MG RE SUPP: 650.0000 mg | Freq: Four times a day (QID) | RECTAL | Status: DC | PRN

## 2016-05-25 MED ORDER — HYDRALAZINE HCL 20 MG/ML IJ SOLN
10.0000 mg | INTRAMUSCULAR | Status: DC | PRN
  Administered 2016-05-28: 10 mg via INTRAVENOUS
  Filled 2016-05-25: qty 1

## 2016-05-25 MED ORDER — ACETAMINOPHEN 325 MG PO TABS
650.0000 mg | ORAL_TABLET | Freq: Four times a day (QID) | ORAL | Status: DC | PRN
  Administered 2016-05-29: 650 mg via ORAL
  Filled 2016-05-25: qty 2

## 2016-05-25 MED ORDER — TAMSULOSIN HCL 0.4 MG PO CAPS
0.4000 mg | ORAL_CAPSULE | Freq: Every day | ORAL | Status: DC
  Administered 2016-05-26 – 2016-06-02 (×8): 0.4 mg via ORAL
  Filled 2016-05-25 (×8): qty 1

## 2016-05-25 MED ORDER — SODIUM BICARBONATE 650 MG PO TABS
650.0000 mg | ORAL_TABLET | Freq: Two times a day (BID) | ORAL | Status: DC
Start: 2016-05-25 — End: 2016-06-03
  Administered 2016-05-25 – 2016-06-03 (×18): 650 mg via ORAL
  Filled 2016-05-25 (×18): qty 1

## 2016-05-25 MED ORDER — ISOSORBIDE MONONITRATE ER 30 MG PO TB24
120.0000 mg | ORAL_TABLET | Freq: Every day | ORAL | Status: DC
  Administered 2016-05-26 – 2016-06-03 (×9): 120 mg via ORAL
  Filled 2016-05-25 (×9): qty 4

## 2016-05-25 MED ORDER — FUROSEMIDE 40 MG PO TABS: 40.0000 mg | ORAL_TABLET | Freq: Every day | ORAL | Status: DC

## 2016-05-25 MED ORDER — HEPARIN (PORCINE) IN NACL 100-0.45 UNIT/ML-% IJ SOLN
1200.0000 [IU]/h | INTRAMUSCULAR | Status: DC
  Filled 2016-05-25: qty 250

## 2016-05-25 MED ORDER — FERROUS SULFATE 325 (65 FE) MG PO TABS
325.0000 mg | ORAL_TABLET | Freq: Every day | ORAL | Status: DC
  Administered 2016-05-26 – 2016-06-03 (×9): 325 mg via ORAL
  Filled 2016-05-25 (×9): qty 1

## 2016-05-25 MED ORDER — POLYETHYLENE GLYCOL 3350 17 G PO PACK: 17.0000 g | PACK | Freq: Every day | ORAL | Status: DC | PRN

## 2016-05-25 MED ORDER — LEVOTHYROXINE SODIUM 25 MCG PO TABS
25.0000 ug | ORAL_TABLET | Freq: Every day | ORAL | Status: DC
  Administered 2016-05-26 – 2016-05-29 (×4): 25 ug via ORAL
  Filled 2016-05-25 (×4): qty 1

## 2016-05-25 NOTE — Progress Notes (Deleted)
Cardiology Office Note    Date:  05/25/2016   ID:  [name] Marble Sr., DOB [birth year], MRN [id number]  PCP:  [name], MD  Cardiologist:  Dr. Tamala Julian  Electrophysiologist: Dr. Rayann Heman  Chief Complaint: Hospital follow up s/p    /  Months follow up  History of Present Illness:   [name]. is a 80 y.o. male with complex PMH as descried below who added to schedule for   He has a hx of CAD status post CABG 1994 with subsequent stents and repeat CABG in 2013, hypertension, HLD, CKD, chronic diastolic heart failure, sinus bradycardia sp PPM (SJM) by Dr Leonia Reeves.Has PAF, CHADSVASC=5but is not anticoagulated for atrial fibrillation due to prior GI bleed. He is on aspirin and Plavix. SawDr. Rayann Heman in 10/2015 to discuss watchman. Because he had no atrial fibrillation for the past 6 months conservative management was recommended. Amiodarone was continued.  Patient developed Staphylococcus areuosbacteremia 01/2016 and was found to have an echo density upon the atrial lead of his pacemaker. His ventricular pacemaker leads were extracted.  Patient was readmitted 04/12/16 with C. difficile colitis and hypertensive urgency with blood pressure of 237/97. He also had mild elevated troponins and 2-D echo showed a possible tricuspid vegetation which was confirmed on TEE. 3 sets of blood cultures were negative for growth. It's felt he had endocarditis from prior bacteremia. Thoracic surgery was consult it who recommended that he is not a candidate for surgical treatment given his age, multiple comorbidities and prior CABG and calcified vessels. Antibiotics were recommended for 6 weeks.  His PPM and leads removed due to vegetation on atrial lead. He had this for SSS.   Seen twice last month for edema and hypotension requiring multiple adjustment.    Past Medical History:  Diagnosis Date  . Angiomyolipoma 2009   On both kidneys noted in 2009  . Arthritis    neck and left wrist  .  Atrial fibrillation ([place])   . CAD (coronary artery disease)    s/b CABG 1994, and subsequent stents. Repeat CABG 12/2011,  . Chronic diastolic heart failure ([place])   . CKD (chronic kidney disease)   . Clostridium difficile diarrhea 03/2016  . Depressive disorder   . Diabetes mellitus type 2 with peripheral artery disease (HCC)    DIET CONTROLLED  . DVT (deep venous thrombosis) ([place]) 2011   Right arm  . Gastroparesis   . GERD (gastroesophageal reflux disease)   . Gout   . History of hiatal hernia   . Hyperlipidemia   . Hypertension   . Internal hemorrhoids without mention of complication   . Ischemic colitis ([place])   . 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 Myelodysplastic syndrome ([place]) 05/22/2013   With low hemoglobin and platelets treated with Procrit  . Osteopenia   . Peptic ulcer    S/p partial gastrectomy in 1969  . Peripheral artery disease ([place])   . Pneumonia 01/16/2016  . Pneumonia 03/2016  . Presence of permanent cardiac pacemaker   . Prostate cancer ([place]) 1997   XRT and lupron  . Renal artery stenosis ([place])   . Sick sinus syndrome ([place])   . Vitamin B 12 deficiency     Past Surgical History:  Procedure Laterality Date  . ABDOMINAL ANGIOGRAM N/A 05/25/2011   Procedure: ABDOMINAL ANGIOGRAM;  Surgeon: Serafina Mitchell, MD;  Location: [facility] CATH LAB;  Service: Cardiovascular;  Laterality: N/A;  . ABDOMINAL ANGIOGRAM N/A 02/13/2013   Procedure: ABDOMINAL ANGIOGRAM;  Surgeon:  Serafina Mitchell, MD;  Location: [facility] CATH LAB;  Service: Cardiovascular;  Laterality: N/A;  . APPENDECTOMY  1991  . CELIAC ARTERY ANGIOPLASTY  05-16-12   and stenting  . CHOLECYSTECTOMY  Oct 2009   Laparoscopic  . CORONARY ARTERY BYPASS GRAFT  01/22/1993  . CORONARY ARTERY BYPASS GRAFT  01/03/2012   Procedure: REDO CORONARY ARTERY BYPASS GRAFTING (CABG);  Surgeon: Gaye Pollack, MD;  Location: [place];  Service: Open Heart Surgery;  Laterality: N/A;  Redo CABG x  using bilateral internal mammary arteries;  left leg  greater saphenous vein harvested endoscopically  . FOREIGN BODY REMOVAL ABDOMINAL Right 01/27/2016   Procedure: EXPOSURE OF RIGHT COMMON FEMORAL ARTERY AND REMOVAL FOREIGN;  Surgeon: Evans Lance, MD;  Location: [place];  Service: Cardiovascular;  Laterality: Right;  . HIATAL HERNIA REPAIR     and ulcer repair  . I&D EXTREMITY Left 01/21/2016   Procedure: IRRIGATION AND DEBRIDEMENT EXTREMITY;  Surgeon: Milly Jakob, MD;  Location: [place];  Service: Orthopedics;  Laterality: Left;  . INGUINAL HERNIA REPAIR Right 10/28/2015   Procedure: OPEN RIGHT INGUINAL HERNIA REPAIR;  Surgeon: Greer Pickerel, MD;  Location: [facility];  Service: General;  Laterality: Right;  . INSERTION OF MESH Right 10/28/2015   Procedure: INSERTION OF MESH;  Surgeon: Greer Pickerel, MD;  Location: [facility];  Service: General;  Laterality: Right;  . IR GENERIC HISTORICAL  02/02/2016   IR FLUORO GUIDE CV LINE LEFT 02/02/2016 Markus Daft, MD [facility]  . IR GENERIC HISTORICAL  02/02/2016   IR US GUIDE VASC ACCESS LEFT 02/02/2016 Markus Daft, MD [facility]  . IR GENERIC HISTORICAL  03/31/2016   IR REMOVAL TUN CV CATH W/O FL 03/31/2016 Arne Cleveland, MD [facility]  . LEFT HEART CATHETERIZATION WITH CORONARY/GRAFT ANGIOGRAM  12/24/2011   Procedure: LEFT HEART CATHETERIZATION WITH Beatrix Fetters;  Surgeon: Sinclair Grooms, MD;  Location[facility] CATH LAB;  Service: Cardiovascular;;  . LOWER EXTREMITY ANGIOGRAM Bilateral 05/25/2011   Procedure: LOWER EXTREMITY ANGIOGRAM;  Surgeon: Serafina Mitchell, MD;  Location: [facility] CATH LAB;  Service: Cardiovascular;  Laterality: Bilateral;  . OTHER SURGICAL HISTORY  02/13/13   superior mesenteric artery angiogram  . OTHER SURGICAL HISTORY  05/16/12   Stent in stomach  . PACEMAKER GENERATOR CHANGE  12/10/2003   SJM Identity XL DR performed by Dr Leonia Reeves  . PACEMAKER INSERTION  10/18/1994   DDD pacemaker, [employer]. Gen change 12/10/2003.  Marland Kitchen PACEMAKER LEAD REMOVAL Right 01/27/2016   Procedure: PACEMAKER  EXTRACTION;  Surgeon: Evans Lance, MD;  Location: [place];  Service: Cardiovascular;  Laterality: Right;  DR. Roxy Manns TO BACKUP CASE  . PARTIAL GASTRECTOMY  1969   Hx of ulcer s/p partial gastrectomy/ has pernicious anemia  . RENAL ANGIOGRAM N/A 02/13/2013   Procedure: RENAL ANGIOGRAM;  Surgeon: Serafina Mitchell, MD;  Location: [facility] CATH LAB;  Service: Cardiovascular;  Laterality: N/A;  . TEE WITHOUT CARDIOVERSION N/A 01/26/2016   Procedure: TRANSESOPHAGEAL ECHOCARDIOGRAM (TEE);  Surgeon: Sueanne Margarita, MD;  Location: [facility] ENDOSCOPY;  Service: Cardiovascular;  Laterality: N/A;  . TEE WITHOUT CARDIOVERSION N/A 01/27/2016   Procedure: TRANSESOPHAGEAL ECHOCARDIOGRAM (TEE);  Surgeon: Evans Lance, MD;  Location: [place];  Service: Cardiovascular;  Laterality: N/A;  . TEE WITHOUT CARDIOVERSION N/A 04/12/2016   Procedure: TRANSESOPHAGEAL ECHOCARDIOGRAM (TEE);  Surgeon: Dorothy Spark, MD;  Location: [place];  Service: Cardiovascular;  Laterality: N/A;  . THROMBECTOMY / EMBOLECTOMY SUBCLAVIAN ARTERY  02/02/10   Right  subclavian thromboectomy and venous angioplasty, and chronic mesenteric ischemia with Herculink stenting to superior mesenteric and celiac arteries - Dr. Trula Slade  . TRANSURETHRAL RESECTION OF PROSTATE N/A 02/22/2016   Procedure: CYSTO, CLOT EVACUATION, FULGERATION;  Surgeon: Alexis Frock, MD;  Location: [facility];  Service: Urology;  Laterality: N/A;  . VISCERAL ANGIOGRAM N/A 05/25/2011   Procedure: VISCERAL ANGIOGRAM;  Surgeon: Serafina Mitchell, MD;  Location: [facility] CATH LAB;  Service: Cardiovascular;  Laterality: N/A;  . VISCERAL ANGIOGRAM Bilateral 12/28/2011   Procedure: VISCERAL ANGIOGRAM;  Surgeon: Serafina Mitchell, MD;  Location: [facility] CATH LAB;  Service: Cardiovascular;  Laterality: Bilateral;  . VISCERAL ANGIOGRAM N/A 05/16/2012   Procedure: VISCERAL ANGIOGRAM;  Surgeon: Serafina Mitchell, MD;  Location: [facility] CATH LAB;  Service: Cardiovascular;  Laterality: N/A;  . VISCERAL ANGIOGRAM N/A 02/13/2013    Procedure: VISCERAL ANGIOGRAM;  Surgeon: Serafina Mitchell, MD;  Location: [facility] CATH LAB;  Service: Cardiovascular;  Laterality: N/A;    Current Medications: Prior to Admission medications   Medication Sig Start Date End Date Taking? Authorizing Provider  acetaminophen (ARTHRITIS PAIN RELIEF) 650 MG CR tablet Take 650 mg by mouth 3 (three) times daily.     Historical Provider, MD  amiodarone (PACERONE) 200 MG tablet Take one-half tablet by  mouth daily 01/07/16   Belva Crome, MD  atorvastatin (LIPITOR) 80 MG tablet Take 80 mg by mouth at bedtime.     Historical Provider, MD  Calcifediol ER (RAYALDEE) 30 MCG CPCR Take 30 mcg by mouth at bedtime.    Historical Provider, MD  carvedilol (COREG) 25 MG tablet Take 1 tablet (25 mg total) by mouth 2 (two) times daily with a meal. 04/16/16   Nishant Dhungel, MD  ceFAZolin (ANCEF) 1 GM/50ML Inject 50 mLs (1 g total) into the vein every 12 (twelve) hours. 04/16/16 05/26/16  Nishant Dhungel, MD  cyanocobalamin (,VITAMIN B-12,) 1000 MCG/ML injection Inject 1,000 mcg into the muscle every 30 (thirty) days. Vitamin B12 - last injection 09/01/15    Historical Provider, MD  epoetin alfa (EPOGEN,PROCRIT) 91478 UNIT/ML injection Inject 10,000 Units into the skin every 14 (fourteen) days. Done at [facility] - last injection 09/22/15    Historical Provider, MD  ferrous sulfate 325 (65 FE) MG tablet Take 1 tablet (325 mg total) by mouth 3 (three) times daily with meals. 02/24/16   Venetia Maxon Rama, MD  furosemide (LASIX) 80 MG tablet Take 1 tablet (80 mg total) by mouth every other day. Take 1 tablet by mouth as needed for fluid retention and edema 05/10/16   Isaiah Serge, NP  hydrALAZINE (APRESOLINE) 25 MG tablet Take 3 tablets (75 mg total) by mouth 4 (four) times daily. 02/24/16   Christina P Rama, MD  insulin aspart (NOVOLOG) 100 UNIT/ML injection Inject 0-9 Units into the skin. SSI:  121-150 = 1 unit, 151-200 = 2 units, 201-250 = 3 units, 251-300 = 5 units, 301-350  = 7 units, 351-400 = 9 units    Historical Provider, MD  isosorbide mononitrate (IMDUR) 120 MG 24 hr tablet Take 1 tablet by mouth  daily 01/07/16   Belva Crome, MD  leuprolide (LUPRON) 30 MG injection Inject 30 mg into the muscle every 4 (four) months.    Historical Provider, MD  Multiple Vitamin (MULTIVITAMIN WITH MINERALS) TABS tablet Take 1 tablet by mouth every morning.    Historical Provider, MD  potassium chloride SA (K-DUR,KLOR-CON) 20 MEQ tablet Take 1 tablet by mouth twice a day on  the days you take your Lasix (every other day) 05/10/16   Isaiah Serge, NP  senna-docusate (SENOKOT-S) 8.6-50 MG tablet Take 2 tablets by mouth 2 (two) times daily. 02/03/16   Silver Huguenin Elgergawy, MD  silodosin (RAPAFLO) 8 MG CAPS capsule Take 8 mg by mouth at bedtime.     Historical Provider, MD  sodium bicarbonate 650 MG tablet Take 1 tablet (650 mg total) by mouth 2 (two) times daily. 02/03/16   Albertine Patricia, MD  UNABLE TO FIND Med Name: Med pass 120 mL 2 times daily    Historical Provider, MD    Allergies:   Nsaids; Ibuprofen; and Ace inhibitors   Social History   Social History  . Marital status: Married    Spouse name: N/A  . Number of children: N/A  . Years of education: N/A   Social History Main Topics  . Smoking status: Former Smoker    Years: 20.00    Types: Cigarettes    Quit date: 07/12/1969  . Smokeless tobacco: Never Used  . Alcohol use No  . Drug use: No  . Sexual activity: Yes   Other Topics Concern  . Not on file   Social History Narrative  . No narrative on file     Family History:  The patient's family history includes CAD in his brother; CVA (age of onset: 85) in his mother; Cancer in his father, paternal uncle, and sister; Diabetes in his mother and sister; Heart attack in his daughter; Heart disease in his brother, daughter, mother, and sister; Hyperlipidemia in his mother; Hypertension in his daughter, father, mother, and sister. ***  ROS:   Please see the  history of present illness.    ROS All other systems reviewed and are negative.   PHYSICAL EXAM:   VS:  There were no vitals taken for this visit.   GEN: Well nourished, well developed, in no acute distress  HEENT: normal  Neck: no JVD, carotid bruits, or masses Cardiac: ***RRR; no murmurs, rubs, or gallops,no edema  Respiratory:  clear to auscultation bilaterally, normal work of breathing GI: soft, nontender, nondistended, + BS MS: no deformity or atrophy  Skin: warm and dry, no rash Neuro:  Alert and Oriented x 3, Strength and sensation are intact Psych: euthymic mood, full affect  Wt Readings from Last 3 Encounters:  05/07/16 162 lb 1.9 oz (73.5 kg)  05/03/16 152 lb 12.8 oz (69.3 kg)  04/24/16 175 lb (79.4 kg)      Studies/Labs Reviewed:   EKG:  EKG is ordered today.  The ekg ordered today demonstrates ***  Recent Labs: 02/02/2016: Magnesium 1.8 03/30/2016: TSH 12.02 04/09/2016: ALT 8 05/07/2016: BUN 39; Creat 2.33; Potassium 4.6; Sodium 138 05/10/2016: HGB 10.9; Platelets 175   Lipid Panel    Component Value Date/Time   CHOL 122 03/30/2016   TRIG 67 03/30/2016   HDL 51 03/30/2016   CHOLHDL 1.9 12/25/2011 0416   VLDL 8 12/25/2011 0416   LDLCALC 58 03/30/2016    Additional studies/ records that were reviewed today include:   As above   TEE 04/12/16  Left ventricle: Wall thickness was increased in a pattern of moderate LVH. Systolic function was normal. The estimated ejection fraction was in the range of 55% to 60%. Wall motion was normal; there were no regional wall motion abnormalities. - Aortic valve: There was trivial regurgitation. - Aorta: Severe non-mobile atherosclerotic plaque. - Ascending aorta: The ascending aorta was normal in size. - Mitral valve: There  was mild regurgitation. - Left atrium: The atrium was dilated. No evidence of thrombus in the atrial cavity or appendage. - Right ventricle: Systolic function was normal. - Right atrium:  No evidence of thrombus in the atrial cavity or appendage. - Atrial septum: No defect or patent foramen ovale was identified. - Tricuspid valve: There was mild-moderate regurgitation.  Impressions:  - Tricuspid valve is significantly thickened, predominantly the posterior leaflet. There is a mobile echodensity measuring 1.6 x 1.5 cm attached to the posterior leaflet suspicious for a vegetation.    ASSESSMENT & PLAN:    1. Chronic diastolic CHF  2. PAF - Not on anticoagulation due to prior hx of GI bleed. CHA2DS2Vasc is at least 4. On ASA and plavix. Maintaining sinus rhythm on amiodarone.   3. SSS, PAF s/p PPM and now extracted due to positive vegetation  - No plan to new PPM currently.  4. Bacterial endocarditis - On IV abx via PICC. Last date 05/26/16.   5.CKD, stage IV  Medication Adjustments/Labs and Tests Ordered: Current medicines are reviewed at length with the patient today.  Concerns regarding medicines are outlined above.  Medication changes, Labs and Tests ordered today are listed in the Patient Instructions below. There are no Patient Instructions on file for this visit.   Jarrett Soho, [place]  05/25/2016 1:01 PM    [facility] [address], [place], [place]  [address] Phone: [phone]; Fax: [phone]

## 2016-05-25 NOTE — Progress Notes (Signed)
EDCM spoke to patient and his wife at bedside.  Patient confirms he has home health services for a RN from [facility].  Wife reports the CNA is supposed to come back tomorrow.  Patient reports he has a walker and a shower chair at home.  Patient confirms his pcp is Dr. Rex Kras.  [facility] asked patient if there is anything else he needed at home.  Patient reports he would like to have physical therapy services again if possible.  No further EDCM needs at this time.

## 2016-05-25 NOTE — ED Triage Notes (Addendum)
Pt here for right arm edema, DVT to right arm confirmed with ultrasound. PICC line currently in right arm, placed in October, scheduled for removal today. SOB last night. No SOB or CP currently.

## 2016-05-25 NOTE — ED Provider Notes (Addendum)
[facility] DEPT Provider Note   CSN: [id number] Arrival date & time: 05/25/16  1344     History   Chief Complaint Chief Complaint  Patient presents with  . DVT    HPI [name]. is a 80 y.o. male.  HPI Pt has a PICC line in his right arm.  Pt was getting IV abx.  He took his last dose this am.  Pt started having swelling in his arm last night.  Pt was seen by a home health nurse today. They called Dr Johnnye Sima and ordered an [place] of his arm.  The results showed he had a DVT.  Pt was then told to come to the ED.  No fevers.  No chest pain.  No shortness of breath.  Pt does not take any blood thinning medications.   Pt has had trouble with bleeding complications in the past.  He had hematuria requiring treatment.  Believed to be related to his radiation treatments.  He did get blood complications.   Past Medical History:  Diagnosis Date  . Angiomyolipoma 2009   On both kidneys noted in 2009  . Arthritis    neck and left wrist  . Atrial fibrillation ([place])   . CAD (coronary artery disease)    s/b CABG 1994, and subsequent stents. Repeat CABG 12/2011,  . Chronic diastolic heart failure ([place])   . CKD (chronic kidney disease)   . Clostridium difficile diarrhea 03/2016  . Depressive disorder   . Diabetes mellitus type 2 with peripheral artery disease (HCC)    DIET CONTROLLED  . DVT (deep venous thrombosis) ([place]) 2011   Right arm  . Gastroparesis   . GERD (gastroesophageal reflux disease)   . Gout   . History of hiatal hernia   . Hyperlipidemia   . Hypertension   . Internal hemorrhoids without mention of complication   . Ischemic colitis ([place])   . 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 Myelodysplastic syndrome ([place]) 05/22/2013   With low hemoglobin and platelets treated with Procrit  . Osteopenia   . Peptic ulcer    S/p partial gastrectomy in 1969  . Peripheral artery disease ([place])   . Pneumonia 01/16/2016  . Pneumonia 03/2016  . Presence of permanent cardiac pacemaker    . Prostate cancer ([place]) 1997   XRT and lupron  . Renal artery stenosis ([place])   . Sick sinus syndrome ([place])   . Vitamin B 12 deficiency     Patient Active Problem List   Diagnosis Date Noted  . History of pacemaker 05/03/2016  . Anemia, iron deficiency 04/16/2016  . Endocarditis of tricuspid valve   . C. difficile colitis 04/10/2016  . HCAP (healthcare-associated pneumonia) 04/08/2016  . Chronic kidney disease, stage III (moderate) 03/09/2016  . Pressure ulcer 02/23/2016  . Seizure ([place]) 02/20/2016  . Palliative care encounter   . Goals of care, counseling/discussion   . Hypotension   . Pacemaker infection ([place])   . Acute renal failure superimposed on stage 4 chronic kidney disease ([place]) 01/26/2016  . Bacteremia due to Staphylococcus aureus   . Pseudogout involving multiple joints 01/24/2016  . Bladder mass 01/24/2016  . Hemoptysis   . Pain   . Left upper extremity swelling   . Staphylococcus aureus bacteremia 01/17/2016  . CAP (community acquired pneumonia) 01/16/2016  . S/P hernia repair 10/28/2015  . Right inguinal hernia 09/09/2015  . GIB (gastrointestinal bleeding) 08/29/2015  . Hematuria 05/19/2015  . Thrombocytopenia ([place]) 11/11/2014  . Leukopenia 11/11/2014  .  B12 deficiency 08/31/2013  . Pacemaker 08/26/2013  . On amiodarone therapy 08/21/2013  . Gout due to renal impairment, left ankle and foot 06/13/2013    Class: Acute  . CAD of autologous bypass graft 06/05/2013  . MDS (myelodysplastic syndrome) ([place]) 05/22/2013  . Anemia of chronic disease 03/19/2013  . CKD (chronic kidney disease), stage IV ([place]) 03/18/2013  . NSVT (nonsustained ventricular tachycardia) ([place]) 03/18/2013  . Chronic diastolic heart failure ([place]) 03/17/2013  . Diabetes mellitus ([place]) 03/17/2013  . Essential hypertension 03/17/2013  . Mesenteric artery stenosis ([place]) 05/01/2012  . PAF (paroxysmal atrial fibrillation) ([place]) 12/24/2011  . PAD (peripheral artery disease) ([place]) 12/24/2011    . Chronic vascular insufficiency of intestine ([place]) 12/20/2011    Past Surgical History:  Procedure Laterality Date  . ABDOMINAL ANGIOGRAM N/A 05/25/2011   Procedure: ABDOMINAL ANGIOGRAM;  Surgeon: Serafina Mitchell, MD;  Location: [facility] CATH LAB;  Service: Cardiovascular;  Laterality: N/A;  . ABDOMINAL ANGIOGRAM N/A 02/13/2013   Procedure: ABDOMINAL ANGIOGRAM;  Surgeon: Serafina Mitchell, MD;  Location: [facility] CATH LAB;  Service: Cardiovascular;  Laterality: N/A;  . APPENDECTOMY  1991  . CELIAC ARTERY ANGIOPLASTY  05-16-12   and stenting  . CHOLECYSTECTOMY  Oct 2009   Laparoscopic  . CORONARY ARTERY BYPASS GRAFT  01/22/1993  . CORONARY ARTERY BYPASS GRAFT  01/03/2012   Procedure: REDO CORONARY ARTERY BYPASS GRAFTING (CABG);  Surgeon: Gaye Pollack, MD;  Location: [facility];  Service: Open Heart Surgery;  Laterality: N/A;  Redo CABG x  using bilateral internal mammary arteries;  left leg greater saphenous vein harvested endoscopically  . FOREIGN BODY REMOVAL ABDOMINAL Right 01/27/2016   Procedure: EXPOSURE OF RIGHT COMMON FEMORAL ARTERY AND REMOVAL FOREIGN;  Surgeon: Evans Lance, MD;  Location: [facility];  Service: Cardiovascular;  Laterality: Right;  . HIATAL HERNIA REPAIR     and ulcer repair  . I&D EXTREMITY Left 01/21/2016   Procedure: IRRIGATION AND DEBRIDEMENT EXTREMITY;  Surgeon: Milly Jakob, MD;  Location: [facility];  Service: Orthopedics;  Laterality: Left;  . INGUINAL HERNIA REPAIR Right 10/28/2015   Procedure: OPEN RIGHT INGUINAL HERNIA REPAIR;  Surgeon: Greer Pickerel, MD;  Location: [facility];  Service: General;  Laterality: Right;  . INSERTION OF MESH Right 10/28/2015   Procedure: INSERTION OF MESH;  Surgeon: Greer Pickerel, MD;  Location: [facility];  Service: General;  Laterality: Right;  . IR GENERIC HISTORICAL  02/02/2016   IR FLUORO GUIDE CV LINE LEFT 02/02/2016 Markus Daft, MD [facility]  . IR GENERIC HISTORICAL  02/02/2016   IR US GUIDE VASC ACCESS LEFT 02/02/2016 Markus Daft, MD [facility]  . IR  GENERIC HISTORICAL  03/31/2016   IR REMOVAL TUN CV CATH W/O FL 03/31/2016 Arne Cleveland, MD [facility]  . LEFT HEART CATHETERIZATION WITH CORONARY/GRAFT ANGIOGRAM  12/24/2011   Procedure: LEFT HEART CATHETERIZATION WITH Beatrix Fetters;  Surgeon: Sinclair Grooms, MD;  Location: [facility] CATH LAB;  Service: Cardiovascular;;  . LOWER EXTREMITY ANGIOGRAM Bilateral 05/25/2011   Procedure: LOWER EXTREMITY ANGIOGRAM;  Surgeon: Serafina Mitchell, MD;  Location: [facility] CATH LAB;  Service: Cardiovascular;  Laterality: Bilateral;  . OTHER SURGICAL HISTORY  02/13/13   superior mesenteric artery angiogram  . OTHER SURGICAL HISTORY  05/16/12   Stent in stomach  . PACEMAKER GENERATOR CHANGE  12/10/2003   SJM Identity XL DR performed by Dr Leonia Reeves  . PACEMAKER INSERTION  10/18/1994   DDD pacemaker, [employer]. Gen change 12/10/2003.  Marland Kitchen PACEMAKER LEAD REMOVAL  Right 01/27/2016   Procedure: PACEMAKER EXTRACTION;  Surgeon: Evans Lance, MD;  Location: [facility];  Service: Cardiovascular;  Laterality: Right;  DR. Roxy Manns TO BACKUP CASE  . PARTIAL GASTRECTOMY  1969   Hx of ulcer s/p partial gastrectomy/ has pernicious anemia  . RENAL ANGIOGRAM N/A 02/13/2013   Procedure: RENAL ANGIOGRAM;  Surgeon: Serafina Mitchell, MD;  Location: [facility] CATH LAB;  Service: Cardiovascular;  Laterality: N/A;  . TEE WITHOUT CARDIOVERSION N/A 01/26/2016   Procedure: TRANSESOPHAGEAL ECHOCARDIOGRAM (TEE);  Surgeon: Sueanne Margarita, MD;  Location: [facility] ENDOSCOPY;  Service: Cardiovascular;  Laterality: N/A;  . TEE WITHOUT CARDIOVERSION N/A 01/27/2016   Procedure: TRANSESOPHAGEAL ECHOCARDIOGRAM (TEE);  Surgeon: Evans Lance, MD;  Location: [place];  Service: Cardiovascular;  Laterality: N/A;  . TEE WITHOUT CARDIOVERSION N/A 04/12/2016   Procedure: TRANSESOPHAGEAL ECHOCARDIOGRAM (TEE);  Surgeon: Dorothy Spark, MD;  Location: [facility];  Service: Cardiovascular;  Laterality: N/A;  . THROMBECTOMY / EMBOLECTOMY SUBCLAVIAN ARTERY  02/02/10   Right subclavian  thromboectomy and venous angioplasty, and chronic mesenteric ischemia with Herculink stenting to superior mesenteric and celiac arteries - Dr. Trula Slade  . TRANSURETHRAL RESECTION OF PROSTATE N/A 02/22/2016   Procedure: CYSTO, CLOT EVACUATION, FULGERATION;  Surgeon: Alexis Frock, MD;  [facility];  Service: Urology;  Laterality: N/A;  . VISCERAL ANGIOGRAM N/A 05/25/2011   Procedure: VISCERAL ANGIOGRAM;  Surgeon: Serafina Mitchell, MD;  Location: [facility] CATH LAB;  Service: Cardiovascular;  Laterality: N/A;  . VISCERAL ANGIOGRAM Bilateral 12/28/2011   Procedure: VISCERAL ANGIOGRAM;  Surgeon: Serafina Mitchell, MD;  Location: [facility] CATH LAB;  Service: Cardiovascular;  Laterality: Bilateral;  . VISCERAL ANGIOGRAM N/A 05/16/2012   Procedure: VISCERAL ANGIOGRAM;  Surgeon: Serafina Mitchell, MD;  Location: [facility] CATH LAB;  Service: Cardiovascular;  Laterality: N/A;  . VISCERAL ANGIOGRAM N/A 02/13/2013   Procedure: VISCERAL ANGIOGRAM;  Surgeon: Serafina Mitchell, MD;  Location: [facility] CATH LAB;  Service: Cardiovascular;  Laterality: N/A;       Home Medications    Prior to Admission medications   Medication Sig Start Date End Date Taking? Authorizing Provider  acetaminophen (ARTHRITIS PAIN RELIEF) 650 MG CR tablet Take 650 mg by mouth 2 (two) times daily.    Yes Historical Provider, MD  amiodarone (PACERONE) 200 MG tablet Take one-half tablet by  mouth daily 01/07/16  Yes Belva Crome, MD  atorvastatin (LIPITOR) 80 MG tablet Take 80 mg by mouth at bedtime.    Yes Historical Provider, MD  CALCIUM PO Take 1 tablet by mouth daily.   Yes Historical Provider, MD  carvedilol (COREG) 25 MG tablet Take 1 tablet (25 mg total) by mouth 2 (two) times daily with a meal. 04/16/16  Yes Nishant Dhungel, MD  cyanocobalamin (,VITAMIN B-12,) 1000 MCG/ML injection Inject 1,000 mcg into the muscle every 30 (thirty) days.    Yes Historical Provider, MD  epoetin alfa (EPOGEN,PROCRIT) 43329 UNIT/ML injection Inject 10,000 Units into the skin  every 14 (fourteen) days. Done at [facility] - last injection 09/22/15   Yes Historical Provider, MD  ferrous sulfate 325 (65 FE) MG tablet Take 1 tablet (325 mg total) by mouth 3 (three) times daily with meals. 02/24/16  Yes Christina P Rama, MD  furosemide (LASIX) 80 MG tablet Take 1 tablet (80 mg total) by mouth every other day. Take 1 tablet by mouth as needed for fluid retention and edema 05/10/16  Yes Isaiah Serge, NP  hydrALAZINE (APRESOLINE) 25 MG tablet Take 3  tablets (75 mg total) by mouth 4 (four) times daily. 02/24/16  Yes Venetia Maxon Rama, MD  isosorbide mononitrate (IMDUR) 120 MG 24 hr tablet Take 1 tablet by mouth  daily 01/07/16  Yes Belva Crome, MD  leuprolide (LUPRON) 30 MG injection Inject 30 mg into the muscle every 4 (four) months.   Yes Historical Provider, MD  levothyroxine (SYNTHROID, LEVOTHROID) 25 MCG tablet Take 25 mcg by mouth daily before breakfast. 04/29/16  Yes Historical Provider, MD  Multiple Vitamin (MULTIVITAMIN WITH MINERALS) TABS tablet Take 1 tablet by mouth every morning.   Yes Historical Provider, MD  Nutritional Supplements (FEEDING SUPPLEMENT, GLUCERNA 1.2 CAL,) LIQD Take 237 mLs by mouth 2 (two) times daily as needed (nutritional supplement).   Yes Historical Provider, MD  [facility] powder Apply 1 application topically 2 (two) times daily. 05/19/16  Yes Historical Provider, MD  potassium chloride SA (K-DUR,KLOR-CON) 20 MEQ tablet Take 1 tablet by mouth twice a day on the days you take your Lasix (every other day) 05/10/16  Yes Isaiah Serge, NP  senna-docusate (SENOKOT-S) 8.6-50 MG tablet Take 2 tablets by mouth 2 (two) times daily. Patient taking differently: Take 1 tablet by mouth 2 (two) times daily.  02/03/16  Yes Albertine Patricia, MD  silodosin (RAPAFLO) 8 MG CAPS capsule Take 8 mg by mouth at bedtime.    Yes Historical Provider, MD  sodium bicarbonate 650 MG tablet Take 1 tablet (650 mg total) by mouth 2 (two) times daily. 02/03/16  Yes Albertine Patricia, MD  ceFAZolin (ANCEF) 1 GM/50ML Inject 50 mLs (1 g total) into the vein every 12 (twelve) hours. 04/16/16 05/26/16  Louellen Molder, MD    Family History Family History  Problem Relation Age of [place]  . Diabetes Mother   . Heart disease Mother     Heart Disease before age 11  . Hyperlipidemia Mother   . Hypertension Mother   . CVA Mother 39    cause of death  . Cancer Father     stomach/liver  . Hypertension Father     possibly hypertensive  . Cancer Sister     Breast cancer  . CAD Brother   . Heart disease Brother   . Cancer Paternal Uncle     colon  . Heart disease Sister   . Diabetes Sister   . Hypertension Sister   . Heart disease Daughter     Heart Disease before age 64  . Hypertension Daughter   . Heart attack Daughter     Social History Social History  Substance Use Topics  . Smoking status: Former Smoker    Years: 20.00    Types: Cigarettes    Quit date: 07/12/1969  . Smokeless tobacco: Never Used  . Alcohol use No     Allergies   Nsaids; Ibuprofen; and Ace inhibitors   Review of Systems Review of Systems   Physical Exam Updated Vital Signs BP 154/66 (BP Location: Right Arm)   Pulse 67   Temp 97.9 F (36.6 C) (Oral)   Resp 16   SpO2 97%   Physical Exam  Constitutional: No distress.  HENT:  Head: Normocephalic and atraumatic.  Right Ear: External ear normal.  Left Ear: External ear normal.  Eyes: Conjunctivae are normal. Right eye exhibits no discharge. Left eye exhibits no discharge. No scleral icterus.  Neck: Neck supple. No tracheal deviation present.  Cardiovascular: Normal rate, regular rhythm and intact distal pulses.   Pulmonary/Chest: Effort normal and breath sounds  normal. No stridor. No respiratory distress. He has no wheezes. He has no rales.  Abdominal: Soft. Bowel sounds are normal. He exhibits no distension. There is no tenderness. There is no rebound and no guarding.  Musculoskeletal: He exhibits edema. He exhibits  no tenderness.  Edema of rue, picc line in place  Neurological: He is alert. He has normal strength. No cranial nerve deficit (no facial droop, extraocular movements intact, no slurred speech) or sensory deficit. He exhibits normal muscle tone. He displays no seizure activity. Coordination normal.  Skin: Skin is warm and dry. No rash noted.  Psychiatric: He has a normal mood and affect.  Nursing note and vitals reviewed.    ED Treatments / Results  Labs (all labs ordered are listed, but only abnormal results are displayed) Labs Reviewed  CBC WITH DIFFERENTIAL/PLATELET - Abnormal; Notable for the following:       Result Value   RBC 3.48 (*)    Hemoglobin 10.2 (*)    HCT 32.5 (*)    RDW 18.8 (*)    Platelets 133 (*)    All other components within normal limits  BASIC METABOLIC PANEL - Abnormal; Notable for the following:    Glucose, Bld 159 (*)    BUN 47 (*)    Creatinine, Ser 2.38 (*)    Calcium 8.6 (*)    GFR calc non Af Amer 25 (*)    GFR calc Af Amer 28 (*)    All other components within normal limits  PROTIME-INR    EKG  EKG Interpretation  Date/Time:  Tuesday May 25 2016 14:10:25 EST Ventricular Rate:  69 PR Interval:    QRS Duration: 89 QT Interval:  409 QTC Calculation: 439 R Axis:   42 Text Interpretation:  Sinus rhythm Low voltage, extremity leads No significant change since last tracing Confirmed by Jamiria Langill  MD-J, Tameka Hoiland [id number]) on 05/25/2016 2:39:14 PM       Radiology Dg Chest Portable 1 View  Result Date: 05/25/2016 CLINICAL DATA:  Shortness of breath, chest pain, dizziness. EXAM: PORTABLE CHEST 1 VIEW COMPARISON:  04/24/2016 FINDINGS: Right PICC line tip remains in the SVC, unchanged. Prior CABG. There is cardiomegaly. Small bilateral pleural effusions with bibasilar atelectasis. No overt edema. No acute bony abnormality. IMPRESSION: Small bilateral pleural effusions with bibasilar atelectasis. Cardiomegaly.  No overt failure. Electronically Signed   By:  Rolm Baptise M.D.   On: 05/25/2016 15:52    Procedures Procedures (including critical care time)  Medications Ordered in ED Heparin IV   Initial Impression / Assessment and Plan / ED Course  I have reviewed the triage vital signs and the nursing notes.  Pertinent labs & imaging results that were available during my care of the patient were reviewed by me and considered in my medical decision making (see chart for details).  Clinical Course as of May 25 1657  Tue May 25, 2016  1551 Discussed with Dr Johnnye Sima.    Pt does have a confirmed outpatient DVT.  Typically the picc line would get pulled but would discuss whether or not to start anticoagulation.   I will consult with cardiology since pt has bleeding complications and is not on noac meds with his a fib.  [JK]  77 D/w Dr Glennie Hawk.  He is not sure what the appropriate answer is regarding anticoagulation.  Recc discussing with hematology.  [id number] Discussed with Dr Osker Mason.  Rec.  Anticoagulation for 4 weeks 6 weeks.   Treatment complicated by his  renal insufficiency  [JK]    Clinical Course User Index [JK] Dorie Rank, MD    Will consult with medical service for admission.  Pt has renal insufficiency.  Cannot use NOACS or lovenox.   Dr Alen Blew recommends heparin and coumadin.  I will consult with medical service  Final Clinical Impressions(s) / ED Diagnoses   Final diagnoses:  Acute deep vein thrombosis (DVT) of right upper extremity, unspecified vein (HCC)        Dorie Rank, MD 05/25/16 1711

## 2016-05-25 NOTE — Progress Notes (Signed)
Called twice to receive report on patient.  Will attempt to call back.

## 2016-05-25 NOTE — Telephone Encounter (Signed)
I spoke with [name] at [facility]. She states patient has gained 10lbs since last Wednesday (159.8lb-170lb).  She states pt's lasix is being taken EOD, did not take yesterday. She reports trace edema in legs, no crackles, on last Sunday BUN 41 and Creat 2.34. She states pt has PICC line in right arm, denies fever and pain, however swelling is present (right arm 33cm, left arm 27 cm). [name] states she is contacting Dr. Johnnye Sima in regards to PICC swelling.  I advised her pt needs to be seen here today due to weight gain. She told me to call patient's wife to arrange appt.  I called and spoke with [name], pt's wife (on [place]). I advised her pt needs to be seen today, made appt with [name], PA-C at 1430 today. She voiced understanding and agreed with plan.

## 2016-05-25 NOTE — Progress Notes (Signed)
ANTICOAGULATION CONSULT NOTE - Initial Consult  Pharmacy Consult for Heparin Indication: DVT  Allergies  Allergen Reactions  . Nsaids Nausea Only    GI issue  . Ibuprofen Other (See Comments)    GI Issues  . Ace Inhibitors Cough    Patient Measurements:   Heparin Dosing Weight: actual body weight Last recorded weight = 73.5 kg (05/07/16)  Vital Signs: Temp: 97.9 F (36.6 C) (11/14 1351) Temp Source: Oral (11/14 1351) BP: 170/77 (11/14 1658) Pulse Rate: 66 (11/14 1658)  Labs:  Recent Labs  05/25/16 1512  HGB 10.2*  HCT 32.5*  PLT 133*  LABPROT 14.2  INR 1.10  CREATININE 2.38*    CrCl cannot be calculated (Unknown ideal weight.).   Medical History: Past Medical History:  Diagnosis Date  . Angiomyolipoma 2009   On both kidneys noted in 2009  . Arthritis    neck and left wrist  . Atrial fibrillation ([place])   . CAD (coronary artery disease)    s/b CABG 1994, and subsequent stents. Repeat CABG 12/2011,  . Chronic diastolic heart failure ([place])   . CKD (chronic kidney disease)   . Clostridium difficile diarrhea 03/2016  . Depressive disorder   . Diabetes mellitus type 2 with peripheral artery disease (HCC)    DIET CONTROLLED  . DVT (deep venous thrombosis) ([place]) 2011   Right arm  . Gastroparesis   . GERD (gastroesophageal reflux disease)   . Gout   . History of hiatal hernia   . Hyperlipidemia   . Hypertension   . Internal hemorrhoids without mention of complication   . Ischemic colitis ([place])   . 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 Myelodysplastic syndrome ([place]) 05/22/2013   With low hemoglobin and platelets treated with Procrit  . Osteopenia   . Peptic ulcer    S/p partial gastrectomy in 1969  . Peripheral artery disease ([place])   . Pneumonia 01/16/2016  . Pneumonia 03/2016  . Presence of permanent cardiac pacemaker   . Prostate cancer ([place]) 1997   XRT and lupron  . Renal artery stenosis ([place])   . Sick sinus syndrome ([place])   . Vitamin B 12 deficiency      Medications:  Scheduled:  . heparin  4,000 Units Intravenous Once   Infusions:  . heparin      Assessment:  80 yr male with ultrasound confirmed right arm DVT  PMH significant for prostate cancer, PICC line currently in right arm for IV antibiotics, AFib, CAD, CKD, DM, H/O DVT (2011), Myelodysplastic syndrome, h/o GI bleed (08/2015)  Goal of Therapy:  Heparin level 0.3-0.7 units/ml Monitor platelets by anticoagulation protocol: Yes   Plan:   Add aPTT onto Protime/INR drawn earlier today  Heparin 3000 unit IV bolus x 1 followed by heparin infusion @ 1200 units/hr  Check heparin level 8 hr after heparin started  Follow daily heparin level & CBC  Reine Bristow, Toribio Harbour, PharmD 05/25/2016,5:12 PM

## 2016-05-25 NOTE — H&P (Addendum)
History and Physical  Yasuo Pafford [id number] DOB: 01/21/1936 DOA: 05/25/2016  Referring physician: Tomi Bamberger PCP: [name], MD  Outpatient Specialists:  1. Alvy Bimler - hematology 2. [facility] - cardiology 3. [facility] [place]  Chief Complaint: swollen right arm   HPI: Ennio Woulard. is a 80 y.o. male with multiple medical problems detailed below who was recently discharged approximately 6 weeks ago after being treated for endocarditis involving the tricuspid valve requiring weeks weeks of IV antibiotic therapy with Ancef. The patient received the last dose of IV antibiotic today and was in the process of having the PICC line removed when the family noticed that he had swelling in the right upper extremity and some tenderness in the area. The home health nurse evaluated the right upper extremity and obtained an outpatient ultrasound of the right upper extremity that was positive for DVT. The patient was sent to the emergency department for evaluation. The Doppler ultrasound was not repeated however the ER doctor discussed the case with multiple specialists including cardiology and hematology and the decision was made for 6 weeks of anticoagulation.  The patient has a complicated history with bleeding. 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 He reports no recent hematuria. He also has myelodysplastic syndrome and has frequent treatments with hematology. He receives Epogen injections every 2 weeks.  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 He has paroxysmal atrial fibrillation but has not been anticoagulated because of his history of bleeding. I had a long discussion with the patient and his wife and they have decided to proceed with anticoagulation and verbalize that they fully understand the risks. They understand that if the patient starts bleeding again that the anticoagulation will have to be discontinued. Given his  chronic kidney disease hematology recommended heparin and warfarin for anticoagulation for 4-6 weeks duration of therapy.  Review of Systems: All systems reviewed and apart from history of presenting illness, are negative.  Past Medical History:  Diagnosis Date  . Angiomyolipoma 2009   On both kidneys noted in 2009  . Arthritis    neck and left wrist  . Atrial fibrillation ([place])   . CAD (coronary artery disease)    s/b CABG 1994, and subsequent stents. Repeat CABG 12/2011,  . Chronic diastolic heart failure ([place])   . CKD (chronic kidney disease)   . Clostridium difficile diarrhea 03/2016  . Depressive disorder   . Diabetes mellitus type 2 with peripheral artery disease (HCC)    DIET CONTROLLED  . DVT (deep venous thrombosis) ([place]) 2011   Right arm  . Gastroparesis   . GERD (gastroesophageal reflux disease)   . Gout   . History of hiatal hernia   . Hyperlipidemia   . Hypertension   . Internal hemorrhoids without mention of complication   . Ischemic colitis ([place])   . 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 Myelodysplastic syndrome ([place]) 05/22/2013   With low hemoglobin and platelets treated with Procrit  . Osteopenia   . Peptic ulcer    S/p partial gastrectomy in 1969  . Peripheral artery disease ([place])   . Pneumonia 01/16/2016  . Pneumonia 03/2016  . Presence of permanent cardiac pacemaker   . Prostate cancer ([place]) 1997   XRT and lupron  . Renal artery stenosis ([place])   . Sick sinus syndrome ([place])   . Vitamin B 12 deficiency    Past Surgical History:  Procedure Laterality Date  .  ABDOMINAL ANGIOGRAM N/A 05/25/2011   Procedure: ABDOMINAL ANGIOGRAM;  Surgeon: Serafina Mitchell, MD;  Location: [facility] CATH LAB;  Service: Cardiovascular;  Laterality: N/A;  . ABDOMINAL ANGIOGRAM N/A 02/13/2013   Procedure: ABDOMINAL ANGIOGRAM;  Surgeon: Serafina Mitchell, MD;  Location: [facility] CATH LAB;  Service: Cardiovascular;  Laterality: N/A;  . APPENDECTOMY  1991  . CELIAC ARTERY ANGIOPLASTY  05-16-12   and  stenting  . CHOLECYSTECTOMY  Oct 2009   Laparoscopic  . CORONARY ARTERY BYPASS GRAFT  01/22/1993  . CORONARY ARTERY BYPASS GRAFT  01/03/2012   Procedure: REDO CORONARY ARTERY BYPASS GRAFTING (CABG);  Surgeon: Gaye Pollack, MD;  Location: [place];  Service: Open Heart Surgery;  Laterality: N/A;  Redo CABG x  using bilateral internal mammary arteries;  left leg greater saphenous vein harvested endoscopically  . FOREIGN BODY REMOVAL ABDOMINAL Right 01/27/2016   Procedure: EXPOSURE OF RIGHT COMMON FEMORAL ARTERY AND REMOVAL FOREIGN;  Surgeon: Evans Lance, MD;  Location: [place];  Service: Cardiovascular;  Laterality: Right;  . HIATAL HERNIA REPAIR     and ulcer repair  . I&D EXTREMITY Left 01/21/2016   Procedure: IRRIGATION AND DEBRIDEMENT EXTREMITY;  Surgeon: Milly Jakob, MD;  Location: [place];  Service: Orthopedics;  Laterality: Left;  . INGUINAL HERNIA REPAIR Right 10/28/2015   Procedure: OPEN RIGHT INGUINAL HERNIA REPAIR;  Surgeon: Greer Pickerel, MD;  Location: [facility];  Service: General;  Laterality: Right;  . INSERTION OF MESH Right 10/28/2015   Procedure: INSERTION OF MESH;  Surgeon: Greer Pickerel, MD;  Location: [facility];  Service: General;  Laterality: Right;  . IR GENERIC HISTORICAL  02/02/2016   IR FLUORO GUIDE CV LINE LEFT 02/02/2016 Markus Daft, MD [facility]  . IR GENERIC HISTORICAL  02/02/2016   IR US GUIDE VASC ACCESS LEFT 02/02/2016 Markus Daft, MD [facility]  . IR GENERIC HISTORICAL  03/31/2016   IR REMOVAL TUN CV CATH W/O FL 03/31/2016 Arne Cleveland, MD [facility]  . LEFT HEART CATHETERIZATION WITH CORONARY/GRAFT ANGIOGRAM  12/24/2011   Procedure: LEFT HEART CATHETERIZATION WITH Beatrix Fetters;  Surgeon: Sinclair Grooms, MD;  Location[facility] C[facility] LAB;  Service: Cardiovascular;;  . LOWER EXTREMITY ANGIOGRAM Bilateral 05/25/2011   Procedure: LOWER EXTREMITY ANGIOGRAM;  Surgeon: Serafina Mitchell, MD;  Location: [facility] CATH LAB;  Service: Cardiovascular;  Laterality: Bilateral;  . OTHER  SURGICAL HISTORY  02/13/13   superior mesenteric artery angiogram  . OTHER SURGICAL HISTORY  05/16/12   Stent in stomach  . PACEMAKER GENERATOR CHANGE  12/10/2003   SJM Identity XL DR performed by Dr Leonia Reeves  . PACEMAKER INSERTION  10/18/1994   DDD pacemaker, [employer]. Gen change 12/10/2003.  Marland Kitchen PACEMAKER LEAD REMOVAL Right 01/27/2016   Procedure: PACEMAKER EXTRACTION;  Surgeon: Evans Lance, MD;  Location: [place];  Service: Cardiovascular;  Laterality: Right;  DR. Roxy Manns TO BACKUP CASE  . PARTIAL GASTRECTOMY  1969   Hx of ulcer s/p partial gastrectomy/ has pernicious anemia  . RENAL ANGIOGRAM N/A 02/13/2013   Procedure: RENAL ANGIOGRAM;  Surgeon: Serafina Mitchell, MD;  Location: [facility] CATH LAB;  Service: Cardiovascular;  Laterality: N/A;  . TEE WITHOUT CARDIOVERSION N/A 01/26/2016   Procedure: TRANSESOPHAGEAL ECHOCARDIOGRAM (TEE);  Surgeon: Sueanne Margarita, MD;  Location: [facility] ENDOSCOPY;  Service: Cardiovascular;  Laterality: N/A;  . TEE WITHOUT CARDIOVERSION N/A 01/27/2016   Procedure: TRANSESOPHAGEAL ECHOCARDIOGRAM (TEE);  Surgeon: Evans Lance, MD;  Location: [place];  Service: Cardiovascular;  Laterality: N/A;  .  TEE WITHOUT CARDIOVERSION N/A 04/12/2016   Procedure: TRANSESOPHAGEAL ECHOCARDIOGRAM (TEE);  Surgeon: Dorothy Spark, MD;  Location: [place];  Service: Cardiovascular;  Laterality: N/A;  . THROMBECTOMY / EMBOLECTOMY SUBCLAVIAN ARTERY  02/02/10   Right subclavian thromboectomy and venous angioplasty, and chronic mesenteric ischemia with Herculink stenting to superior mesenteric and celiac arteries - Dr. Trula Slade  . TRANSURETHRAL RESECTION OF PROSTATE N/A 02/22/2016   Procedure: CYSTO, CLOT EVACUATION, FULGERATION;  Surgeon: Alexis Frock, MD;  Location: [facility];  Service: Urology;  Laterality: N/A;  . VISCERAL ANGIOGRAM N/A 05/25/2011   Procedure: VISCERAL ANGIOGRAM;  Surgeon: Serafina Mitchell, MD;  Location: [facility] CATH LAB;  Service: Cardiovascular;  Laterality: N/A;  . VISCERAL ANGIOGRAM  Bilateral 12/28/2011   Procedure: VISCERAL ANGIOGRAM;  Surgeon: Serafina Mitchell, MD;  Location: [facility] CATH LAB;  Service: Cardiovascular;  Laterality: Bilateral;  . VISCERAL ANGIOGRAM N/A 05/16/2012   Procedure: VISCERAL ANGIOGRAM;  Surgeon: Serafina Mitchell, MD;  Location: [facility] CATH LAB;  Service: Cardiovascular;  Laterality: N/A;  . VISCERAL ANGIOGRAM N/A 02/13/2013   Procedure: VISCERAL ANGIOGRAM;  Surgeon: Serafina Mitchell, MD;  Location: [facility] CATH LAB;  Service: Cardiovascular;  Laterality: N/A;   Social History:  reports that he quit smoking about 46 years ago. His smoking use included Cigarettes. He quit after 20.00 years of use. He has never used smokeless tobacco. He reports that he does not drink alcohol or use drugs.   Allergies  Allergen Reactions  . Nsaids Nausea Only    GI issue  . Ibuprofen Other (See Comments)    GI Issues  . Ace Inhibitors Cough    Family History  Problem Relation Age of Onset  . Diabetes Mother   . Heart disease Mother     Heart Disease before age 55  . Hyperlipidemia Mother   . Hypertension Mother   . CVA Mother 69    cause of death  . Cancer Father     stomach/liver  . Hypertension Father     possibly hypertensive  . Cancer Sister     Breast cancer  . CAD Brother   . Heart disease Brother   . Cancer Paternal Uncle     colon  . Heart disease Sister   . Diabetes Sister   . Hypertension Sister   . Heart disease Daughter     Heart Disease before age 58  . Hypertension Daughter   . Heart attack Daughter    Prior to Admission medications   Medication Sig Start Date End Date Taking? Authorizing Provider  acetaminophen (ARTHRITIS PAIN RELIEF) 650 MG CR tablet Take 650 mg by mouth 2 (two) times daily.    Yes Historical Provider, MD  amiodarone (PACERONE) 200 MG tablet Take one-half tablet by  mouth daily 01/07/16  Yes Belva Crome, MD  atorvastatin (LIPITOR) 80 MG tablet Take 80 mg by mouth at bedtime.    Yes Historical Provider, MD  CALCIUM PO Take 1  tablet by mouth daily.   Yes Historical Provider, MD  carvedilol (COREG) 25 MG tablet Take 1 tablet (25 mg total) by mouth 2 (two) times daily with a meal. 04/16/16  Yes Nishant Dhungel, MD  cyanocobalamin (,VITAMIN B-12,) 1000 MCG/ML injection Inject 1,000 mcg into the muscle every 30 (thirty) days.    Yes Historical Provider, MD  epoetin alfa (EPOGEN,PROCRIT) 09811 UNIT/ML injection Inject 10,000 Units into the skin every 14 (fourteen) days. Done at [facility] - last injection 09/22/15   Yes Historical Provider,  MD  ferrous sulfate 325 (65 FE) MG tablet Take 1 tablet (325 mg total) by mouth 3 (three) times daily with meals. 02/24/16  Yes Christina P Rama, MD  furosemide (LASIX) 80 MG tablet Take 1 tablet (80 mg total) by mouth every other day. Take 1 tablet by mouth as needed for fluid retention and edema 05/10/16  Yes Isaiah Serge, NP  hydrALAZINE (APRESOLINE) 25 MG tablet Take 3 tablets (75 mg total) by mouth 4 (four) times daily. 02/24/16  Yes Venetia Maxon Rama, MD  isosorbide mononitrate (IMDUR) 120 MG 24 hr tablet Take 1 tablet by mouth  daily 01/07/16  Yes Belva Crome, MD  leuprolide (LUPRON) 30 MG injection Inject 30 mg into the muscle every 4 (four) months.   Yes Historical Provider, MD  levothyroxine (SYNTHROID, LEVOTHROID) 25 MCG tablet Take 25 mcg by mouth daily before breakfast. 04/29/16  Yes Historical Provider, MD  Multiple Vitamin (MULTIVITAMIN WITH MINERALS) TABS tablet Take 1 tablet by mouth every morning.   Yes Historical Provider, MD  Nutritional Supplements (FEEDING SUPPLEMENT, GLUCERNA 1.2 CAL,) LIQD Take 237 mLs by mouth 2 (two) times daily as needed (nutritional supplement).   Yes Historical Provider, MD  [facility] powder Apply 1 application topically 2 (two) times daily. 05/19/16  Yes Historical Provider, MD  potassium chloride SA (K-DUR,KLOR-CON) 20 MEQ tablet Take 1 tablet by mouth twice a day on the days you take your Lasix (every other day) 05/10/16  Yes Isaiah Serge, NP    senna-docusate (SENOKOT-S) 8.6-50 MG tablet Take 2 tablets by mouth 2 (two) times daily. Patient taking differently: Take 1 tablet by mouth 2 (two) times daily.  02/03/16  Yes Albertine Patricia, MD  silodosin (RAPAFLO) 8 MG CAPS capsule Take 8 mg by mouth at bedtime.    Yes Historical Provider, MD  sodium bicarbonate 650 MG tablet Take 1 tablet (650 mg total) by mouth 2 (two) times daily. 02/03/16  Yes Albertine Patricia, MD  ceFAZolin (ANCEF) 1 GM/50ML Inject 50 mLs (1 g total) into the vein every 12 (twelve) hours. 04/16/16 05/26/16  Nishant Dhungel, MD   Physical Exam: Vitals:   05/25/16 1351 05/25/16 1658  BP: 154/66 170/77  Pulse: 67 66  Resp: 16 16  Temp: 97.9 F (36.6 C)   TempSrc: Oral   SpO2: 97% 99%     General exam: Moderately built and nourished patient, lying comfortably supine on the gurney in no obvious distress.  Head, eyes and ENT: Nontraumatic and normocephalic. Pupils equally reacting to light and accommodation. Oral mucosa moist.  Neck: Supple. No JVD, carotid bruit or thyromegaly.  Lymphatics: No lymphadenopathy.  Respiratory system: Clear to auscultation. No increased work of breathing.  Cardiovascular system: S1 and S2 heard, irregular. No JVD, murmurs, gallops, clicks or pedal edema.  Gastrointestinal system: Abdomen is nondistended, soft and nontender. Normal bowel sounds heard. No organomegaly or masses appreciated.  Central nervous system: Alert and oriented. No focal neurological deficits.  Extremities: trace pretibial edema bilateral. Peripheral pulses symmetrically felt.   Skin: No rashes or acute findings.  Musculoskeletal system: PICC line RUE with edema noted to RUE, normal pulses in RUE and hands/fingers with good perfusion.  Psychiatry: Pleasant and cooperative.   Labs on Admission:  Basic Metabolic Panel:  Recent Labs Lab 05/25/16 1512  NA 136  K 4.2  CL 107  CO2 23  GLUCOSE 159*  BUN 47*  CREATININE 2.38*  CALCIUM 8.6*    Liver Function Tests: No results for  input(s): AST, ALT, ALKPHOS, BILITOT, PROT, ALBUMIN in the last 168 hours. No results for input(s): LIPASE, AMYLASE in the last 168 hours. No results for input(s): AMMONIA in the last 168 hours. CBC:  Recent Labs Lab 05/25/16 1512  WBC 9.0  NEUTROABS 6.8  HGB 10.2*  HCT 32.5*  MCV 93.4  PLT 133*   Cardiac Enzymes: No results for input(s): CKTOTAL, CKMB, CKMBINDEX, TROPONINI in the last 168 hours.  BNP (last 3 results) No results for input(s): PROBNP in the last 8760 hours. CBG: No results for input(s): GLUCAP in the last 168 hours.  Radiological Exams on Admission: Dg Chest Portable 1 View  Result Date: 05/25/2016 CLINICAL DATA:  Shortness of breath, chest pain, dizziness. EXAM: PORTABLE CHEST 1 VIEW COMPARISON:  04/24/2016 FINDINGS: Right PICC line tip remains in the SVC, unchanged. Prior CABG. There is cardiomegaly. Small bilateral pleural effusions with bibasilar atelectasis. No overt edema. No acute bony abnormality. IMPRESSION: Small bilateral pleural effusions with bibasilar atelectasis. Cardiomegaly.  No overt failure. Electronically Signed   By: Rolm Baptise M.D.   On: 05/25/2016 15:52    EKG: Independently reviewed.   Assessment/Plan Active Problems:   Acute deep vein thrombosis (DVT) of right upper extremity (HCC)   PAF (paroxysmal atrial fibrillation) (HCC)   PAD (peripheral artery disease) (HCC)   Chronic diastolic heart failure (HCC)   Diabetes mellitus (HCC)   Essential hypertension   NSVT (nonsustained ventricular tachycardia) (HCC)   Anemia of chronic disease   Pacemaker   Acute renal failure superimposed on stage 4 chronic kidney disease ([place])   1. Acute DVT RUE - Discussed with hematologist on call who has recommended 4-6 weeks of anticoagulation.  The patient does have history of hemorrhagic cystitis and anemia from MDS and had in the past been considered poor candidate for anticoagulation.  The patient and his  family were counseled at the bedside and they would like to proceed with anticoagulation and verbalize that the understand the risks of bleeding.  They feel that they want to give the anticoagulation an attempt before they deny.  PICC line ordered to be removed.  Will start IV heparin first because it can be discontinued rapidly and it has a short half-life.  If tolerated and given the patient's CKD would initiate warfarin therapy.   2. Chronic Kidney Disease stage 3/4 - Pt's creatinine will be monitored, it is at baseline from recent hospitalizations.  3. Essential Hypertension - will need to monitor closely and try to keep under good control.  Adjust medications as needed.  4. Recent endocarditis involving tricuspid valve - Pt has fully completed 6 weeks of Ancef under care of Dr. Johnnye Sima with [facility]. 5. MDS with chronic anemia - Pt is under the care of Dr. Alvy Bimler.  May need to ask Dr. Alvy Bimler for her opinion about anticoagulation, monitoring Hg closely.   6. History of PAF - Pt has been managed with beta blockade, follow.    7. Chronic diastolic heart failure - patient appears well compensated and stable on current home medications which will be continued.   8. Diabetes Mellitus type 2 - will monitor blood glucose and initiate sliding scale coverage as needed.       DVT Prophylaxis: IV heparin Code Status: DNR (see palliative medicine documentation in chart)  Family Communication: wife bedside  Disposition Plan: TBD   Time spent: 95 mins  Irwin Brakeman, MD [facility] Pager [phone]  If 7PM-7AM, please contact night-coverage [email or web address] Password [email or web address] 05/25/2016, 5:52 PM

## 2016-05-25 NOTE — Telephone Encounter (Signed)
New message:    Pt have had a 10 lbs weight gain since last week and also increased shortness of breath.Please call to advise.

## 2016-05-26 LAB — GLUCOSE, CAPILLARY
GLUCOSE-CAPILLARY: 121 mg/dL — AB (ref 65–99)
GLUCOSE-CAPILLARY: 111 mg/dL — AB (ref 65–99)
GLUCOSE-CAPILLARY: 151 mg/dL — AB (ref 65–99)

## 2016-05-26 LAB — URINALYSIS, ROUTINE W REFLEX MICROSCOPIC
BILIRUBIN URINE: NEGATIVE
Glucose, UA: NEGATIVE mg/dL
KETONES UR: NEGATIVE mg/dL
NITRITE: POSITIVE — AB
PH: 6 (ref 5.0–8.0)
Protein, ur: 100 mg/dL — AB
SPECIFIC GRAVITY, URINE: 1.015 (ref 1.005–1.030)

## 2016-05-26 LAB — BASIC METABOLIC PANEL
ANION GAP: 8 (ref 5–15)
BUN: 46 mg/dL — ABNORMAL HIGH (ref 6–20)
CALCIUM: 8.6 mg/dL — AB (ref 8.9–10.3)
CHLORIDE: 107 mmol/L (ref 101–111)
CO2: 21 mmol/L — AB (ref 22–32)
CREATININE: 2.31 mg/dL — AB (ref 0.61–1.24)
GFR calc non Af Amer: 25 mL/min — ABNORMAL LOW (ref >60)
GFR, EST AFRICAN AMERICAN: 29 mL/min — AB (ref >60)
Glucose, Bld: 143 mg/dL — ABNORMAL HIGH (ref 65–99)
Potassium: 4.9 mmol/L (ref 3.5–5.1)
SODIUM: 136 mmol/L (ref 135–145)

## 2016-05-26 LAB — CBC
HCT: 36.7 % — ABNORMAL LOW (ref 39.0–52.0)
HEMOGLOBIN: 11.7 g/dL — AB (ref 13.0–17.0)
MCH: 30.2 pg (ref 26.0–34.0)
MCHC: 31.9 g/dL (ref 30.0–36.0)
MCV: 94.6 fL (ref 78.0–100.0)
Platelets: 135 10*3/uL — ABNORMAL LOW (ref 150–400)
RBC: 3.88 MIL/uL — AB (ref 4.22–5.81)
RDW: 19.2 % — ABNORMAL HIGH (ref 11.5–15.5)
WBC: 8.1 10*3/uL (ref 4.0–10.5)

## 2016-05-26 LAB — MRSA PCR SCREENING: MRSA BY PCR: NEGATIVE

## 2016-05-26 LAB — URINE MICROSCOPIC-ADD ON: SQUAMOUS EPITHELIAL / LPF: NONE SEEN

## 2016-05-26 LAB — HEPARIN LEVEL (UNFRACTIONATED)
HEPARIN UNFRACTIONATED: 0.42 [IU]/mL (ref 0.30–0.70)
Heparin Unfractionated: 0.32 IU/mL (ref 0.30–0.70)

## 2016-05-26 MED ORDER — CLONIDINE HCL 0.1 MG PO TABS
0.1000 mg | ORAL_TABLET | Freq: Every day | ORAL | Status: DC
  Administered 2016-05-27: 0.1 mg via ORAL
  Filled 2016-05-26: qty 1

## 2016-05-26 MED ORDER — WARFARIN SODIUM 4 MG PO TABS
4.0000 mg | ORAL_TABLET | Freq: Once | ORAL | Status: AC
  Administered 2016-05-26: 4 mg via ORAL
  Filled 2016-05-26: qty 1

## 2016-05-26 MED ORDER — FUROSEMIDE 40 MG PO TABS: 80.0000 mg | ORAL_TABLET | Freq: Every day | ORAL | Status: DC

## 2016-05-26 MED ORDER — WARFARIN - PHARMACIST DOSING INPATIENT
Freq: Every day | Status: DC
  Administered 2016-05-31: 18:00:00

## 2016-05-26 NOTE — Evaluation (Signed)
Physical Therapy Evaluation Patient Details Name: [name] FELIPE Sr. MRN: [id number] DOB: 1935-11-20 Today's Date: 05/26/2016   History of Present Illness  80 yo male admitted with acute DVT R UE. Hx of DVT, CKD, P Afib, HF, MDS, osteopenia, HTN, hemorrhagic cystitis, anemia.  Clinical Impression  On eval, pt required Min-Mod assist for mobility. He walked ~75 feet with a RW. Pt fatigues fairly easily. Discussed d/c plan-pt states he will return home. Recommend HHPT follow.     Follow Up Recommendations Home health PT    Equipment Recommendations  None recommended by PT    Recommendations for Other Services       Precautions / Restrictions Precautions Precautions: Fall Restrictions Weight Bearing Restrictions: No      Mobility  Bed Mobility Overal bed mobility: Needs Assistance Bed Mobility: Supine to Sit;Sit to Supine     Supine to sit: Min assist;HOB elevated Sit to supine: Mod assist;HOB elevated   General bed mobility comments: Assist to get to EOB and for LEs back onto bed. Increased time.   Transfers Overall transfer level: Needs assistance Equipment used: Rolling walker (2 wheeled) Transfers: Sit to/from Stand Sit to Stand: Mod assist         General transfer comment: Assist to rise, stabilize, control descent. Multiple attempts to get to standing from elevated bed.   Ambulation/Gait Ambulation/Gait assistance: Min assist Ambulation Distance (Feet): 75 Feet Assistive device: Rolling walker (2 wheeled) Gait Pattern/deviations: Step-through pattern;Decreased stride length     General Gait Details: Assist to stabilize intermittently. Pt fatigues fairly easily.   Stairs            Wheelchair Mobility    Modified Rankin (Stroke Patients Only)       Balance Overall balance assessment: Needs assistance           Standing balance-Leahy Scale: Poor Standing balance comment: requires RW                              Pertinent Vitals/Pain Pain Assessment: No/denies pain    Home Living Family/patient expects to be discharged to:: Private residence Living Arrangements: Spouse/significant other;Children Available Help at Discharge: Family Type of Home: House         Home Equipment: Environmental consultant - 2 wheels;Cane - single point      Prior Function Level of Independence: Independent with assistive device(s);Needs assistance      ADL's / Homemaking Assistance Needed: wife assist with ADLs  Comments: using RW for ambulation     Hand Dominance        Extremity/Trunk Assessment   Upper Extremity Assessment: Generalized weakness           Lower Extremity Assessment: Generalized weakness      Cervical / Trunk Assessment: Normal  Communication   Communication: No difficulties  Cognition Arousal/Alertness: Awake/alert Behavior During Therapy: WFL for tasks assessed/performed Overall Cognitive Status: Within Functional Limits for tasks assessed                      General Comments      Exercises     Assessment/Plan    PT Assessment Patient needs continued PT services  PT Problem List Decreased strength;Decreased mobility;Decreased activity tolerance;Decreased balance;Decreased knowledge of use of DME          PT Treatment Interventions DME instruction;Therapeutic activities;Gait training;Therapeutic exercise;Patient/family education;Functional mobility training;Balance training    PT Goals (Current goals can be found  in the Care Plan section)  Acute Rehab PT Goals Patient Stated Goal: home soon PT Goal Formulation: With patient Time For Goal Achievement: 06/09/16 Potential to Achieve Goals: Good    Frequency Min 3X/week   Barriers to discharge        Co-evaluation               End of Session Equipment Utilized During Treatment: Gait belt Activity Tolerance: Patient limited by fatigue Patient left: in bed;with call bell/phone within reach            Time: 1147-1212 PT Time Calculation (min) (ACUTE ONLY): 25 min   Charges:   PT Evaluation $PT Eval Low Complexity: 1 Procedure PT Treatments $Gait Training: 8-22 mins   PT G Codes:        Weston Anna, MPT Pager: [phone]

## 2016-05-26 NOTE — Progress Notes (Signed)
ANTICOAGULATION CONSULT NOTE -   Pharmacy Consult for Heparin  Indication: DVT  Allergies  Allergen Reactions  . Nsaids Nausea Only    GI issue  . Ibuprofen Other (See Comments)    GI Issues  . Ace Inhibitors Cough    Patient Measurements:   Heparin Dosing Weight: actual body weight Last recorded weight = 73.5 kg (05/07/16)  Vital Signs: Temp: 98.3 F (36.8 C) (11/15 0505) Temp Source: Oral (11/15 0505) BP: 180/78 (11/15 0505) Pulse Rate: 71 (11/15 0505)  Labs:  Recent Labs  05/25/16 1512 05/25/16 1557 05/26/16 0139 05/26/16 1401  HGB 10.2*  --  11.7*  --   HCT 32.5*  --  36.7*  --   PLT 133*  --  135*  --   APTT  --  39*  --   --   LABPROT 14.2  --   --   --   INR 1.10  --   --   --   HEPARINUNFRC  --   --  0.42 0.32  CREATININE 2.38*  --  2.31*  --     CrCl cannot be calculated (Unknown ideal weight.).   Medical History: Past Medical History:  Diagnosis Date  . Angiomyolipoma 2009   On both kidneys noted in 2009  . Arthritis    neck and left wrist  . Atrial fibrillation ([place])   . CAD (coronary artery disease)    s/b CABG 1994, and subsequent stents. Repeat CABG 12/2011,  . Chronic diastolic heart failure ([place])   . CKD (chronic kidney disease)   . Clostridium difficile diarrhea 03/2016  . Depressive disorder   . Diabetes mellitus type 2 with peripheral artery disease (HCC)    DIET CONTROLLED  . DVT (deep venous thrombosis) ([place]) 2011   Right arm  . Gastroparesis   . GERD (gastroesophageal reflux disease)   . Gout   . History of hiatal hernia   . Hyperlipidemia   . Hypertension   . Internal hemorrhoids without mention of complication   . Ischemic colitis ([place])   . 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 Myelodysplastic syndrome ([place]) 05/22/2013   With low hemoglobin and platelets treated with Procrit  . Osteopenia   . Peptic ulcer    S/p partial gastrectomy in 1969  . Peripheral artery disease ([place])   . Pneumonia 01/16/2016  . Pneumonia 03/2016  .  Presence of permanent cardiac pacemaker   . Prostate cancer ([place]) 1997   XRT and lupron  . Renal artery stenosis ([place])   . Sick sinus syndrome ([place])   . Vitamin B 12 deficiency     Medications:  Scheduled:  . acetaminophen  650 mg Oral BID  . amiodarone  100 mg Oral Daily  . atorvastatin  80 mg Oral QHS  . carvedilol  25 mg Oral BID WC  . ferrous sulfate  325 mg Oral Q breakfast  . [START ON 05/27/2016] furosemide  40 mg Oral Daily  . hydrALAZINE  75 mg Oral QID  . insulin aspart  0-9 Units Subcutaneous TID WC  . isosorbide mononitrate  120 mg Oral Daily  . levothyroxine  25 mcg Oral QAC breakfast  . multivitamin with minerals  1 tablet Oral q morning - 10a  . [START ON 05/27/2016] potassium chloride SA  20 mEq Oral Daily  . senna-docusate  2 tablet Oral BID  . sodium bicarbonate  650 mg Oral BID  . tamsulosin  0.4 mg Oral QPC supper  . Warfarin - Pharmacist  Dosing Inpatient   Does not apply q1800   Infusions:  . heparin 1,200 Units/hr (05/26/16 1251)    Assessment:  80 yr male with ultrasound confirmed right arm DVT  PMH significant for prostate cancer, PICC line currently in right arm for IV antibiotics, AFib, CAD, CKD, DM, H/O DVT (2011), Myelodysplastic syndrome, h/o GI bleed (08/2015)  Pharmacy consulted to start warfarin  05/26/2016 INR 1.1, APTT slightly elevated Confirmatory HL therapeutic Scr 2.31 DDI :amiodarone No issues per RN  Goal of Therapy:  INR 2-3 Heparin level 0.3-0.7 units/ml Monitor platelets by anticoagulation protocol: Yes   Plan:   Warfarin 4mg  po x 1  Daily INR  Continue  heparin infusion @ 1200 units/hr  Follow daily heparin level & CBC  Will need 5 day overlap of heparin and warfarin and INR therapeutic x 24 hours before stopping heparin.  Pharmacy to provide warfarin education  Dolly Rias RPh 05/26/2016, 3:08 PM Pager [phone]

## 2016-05-26 NOTE — Progress Notes (Signed)
ANTICOAGULATION CONSULT NOTE - Follow Up Consult  Pharmacy Consult for Heparin Indication: DVT  Allergies  Allergen Reactions  . Nsaids Nausea Only    GI issue  . Ibuprofen Other (See Comments)    GI Issues  . Ace Inhibitors Cough    Patient Measurements:   Heparin Dosing Weight:   Vital Signs: Temp: 97.9 F (36.6 C) (11/14 2000) Temp Source: Oral (11/14 2000) BP: 192/84 (11/14 2000) Pulse Rate: 70 (11/14 2000)  Labs:  Recent Labs  05/25/16 1512 05/25/16 1557 05/26/16 0139  HGB 10.2*  --  11.7*  HCT 32.5*  --  36.7*  PLT 133*  --  135*  APTT  --  39*  --   LABPROT 14.2  --   --   INR 1.10  --   --   HEPARINUNFRC  --   --  0.42  CREATININE 2.38*  --  2.31*    CrCl cannot be calculated (Unknown ideal weight.).   Medications:  Infusions:  . heparin 1,200 Units/hr (05/25/16 1821)    Assessment: Patient with heparin level at goal.  No heparin issues noted.   Goal of Therapy:  Heparin level 0.3-0.7 units/ml Monitor platelets by anticoagulation protocol: Yes   Plan:  Continue heparin drip at current rate Recheck level at [place], [place] 05/26/2016,4:48 AM

## 2016-05-26 NOTE — Progress Notes (Signed)
Please see EDCM note. PT is recommending HHPT at discharge. Pt already active with [facility] for [facility] serivces.  Will need orders for HHRN/PT/Aide at discharge. CM will continue to follow. Marney Doctor RN,BSN,NCM [phone]

## 2016-05-26 NOTE — Progress Notes (Signed)
Nutrition Brief Note  Patient identified on supplement report as receiving Glucerna 1.2 PRN.  Pt in room eating lunch at time of visit. Pt reports good appetite with no changes recently. States he eats well with 3 meals a day at home. Denies swallowing or chewing issues.  Pt states he does drink Glucerna supplements at home but does not want them at this time. Encourage pt to request supplements if he changes his mind.  Pt reports UBW of 165 lb. States he has lately gained weight but feels it is mostly fluid related.  Wt Readings from Last 15 Encounters:  05/07/16 162 lb 1.9 oz (73.5 kg)  05/03/16 152 lb 12.8 oz (69.3 kg)  04/24/16 175 lb (79.4 kg)  04/09/16 175 lb 7.8 oz (79.6 kg)  04/08/16 176 lb (79.8 kg)  03/31/16 176 lb (79.8 kg)  03/25/16 176 lb (79.8 kg)  03/19/16 176 lb (79.8 kg)  03/18/16 176 lb (79.8 kg)  02/25/16 189 lb (85.7 kg)  02/24/16 189 lb 13.1 oz (86.1 kg)  02/05/16 154 lb (69.9 kg)  02/03/16 180 lb 5.4 oz (81.8 kg)  11/07/15 156 lb 3.2 oz (70.9 kg)  10/28/15 157 lb (71.2 kg)    There is no height or weight on file to calculate BMI.  BMI was 23.3 kg/m^2 based on weight and height from 10/27.Patient meets criteria for normal range based on current BMI.   Current diet order is Renal/CHO modified w/ fluid restriction, patient is consuming approximately 100% of meals at this time. Labs and medications reviewed.   No nutrition interventions warranted at this time. If nutrition issues arise, please consult RD.   Clayton Bibles, MS, RD, LDN Pager: [phone] After Hours Pager: [phone]

## 2016-05-26 NOTE — Progress Notes (Signed)
PROGRESS NOTE  [name]  [id number] DOB: 1935-10-21 DOA: 05/25/2016 PCP: [name], MD  Brief Narrative:   [name]. is a 80 y.o. male with multiple medical problems detailed below who was recently discharged approximately 6 weeks ago after being treated for endocarditis involving the tricuspid valve requiring weeks weeks of IV antibiotic therapy with Ancef. The patient received the last dose of IV antibiotic on 05/25/16 and was in the process of having the PICC line removed when the family noticed that he had swelling in the right upper extremity and some tenderness in the area. The home health nurse evaluated the right upper extremity and obtained an outpatient ultrasound of the right upper extremity that was positive for DVT. The patient was sent to the emergency department. The Doppler ultrasound was not repeated however the ER doctor discussed the case with multiple specialists including cardiology and hematology and the decision was made for 6 weeks of anticoagulation.  The patient has a complicated history with bleeding. 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  He also has myelodysplastic syndrome and has biweekly Epogen injections by hematology.  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 He has paroxysmal atrial fibrillation but has not been anticoagulated because of his history of bleeding.  Admitting hospitalist had a long discussion with the patient and his wife and they have decided to proceed with anticoagulation and verbalize that they fully understand the risks. They understand that if the patient starts bleeding again that the anticoagulation will have to be discontinued. Given his chronic kidney disease hematology recommended heparin and warfarin for anticoagulation for 4-6 weeks duration of therapy.  Assessment & Plan:   Active Problems:   PAF (paroxysmal atrial fibrillation)  (HCC)   PAD (peripheral artery disease) (HCC)   Chronic diastolic heart failure (HCC)   Diabetes mellitus (HCC)   Essential hypertension   NSVT (nonsustained ventricular tachycardia) (HCC)   Anemia of chronic disease   Pacemaker   Acute renal failure superimposed on stage 4 chronic kidney disease (HCC)   Acute deep vein thrombosis (DVT) of right upper extremity (HCC)  Acute DVT RUE related to right upper extremity PICC line -  Continue heparin -  No evidence of bleeding so far -  Start warfarin tonight -  Followed by Cardiology previously for INR checks   CKD stage 3/4, creatinine stable near 3.5  Essential hypertension, blood pressure elevated  -  Continue carvedilol, hydralazine, imdur -  Add clonidine once daily  Recent endocarditis of the tricuspid valve s/p 6 weeks of ancef per Dr. Johnnye Sima, ID  MDS with chronic anemia followed by Dr. Alvy Bimler -  Close outpatient hgb monitoring  Hx of PAF, managed with beta blocker, stable, now on anticoagulation  Chronic diastolic heart failure, some lower extremity edema on exam -  Resume lasix  Diabetes mellitus type 2, stable, continue SSI  Urinarlysis suggestive of infection -  Add on urine culture  DVT prophylaxis:  Heparin gtt Code Status:  DNR Family Communication:  Patient alone Disposition Plan:  Pending therapeutic INR, no bleeding, blood pressure improved   Consultants:   none  Procedures:  none  Antimicrobials:   none    Subjective: Right arm continues to feel swollen, but denies chest pain or SOB.  Denies lower extremity edema.    Objective: Vitals:   05/25/16 1658 05/25/16 1841 05/25/16 2000 05/26/16 0505  BP: 170/77 183/84 (!) 192/84 Marland Kitchen)  180/78  Pulse: 66 64 70 71  Resp: 16 16 16 16   Temp:   97.9 F (36.6 C) 98.3 F (36.8 C)  TempSrc:   Oral Oral  SpO2: 99% 100% 100% 100%    Intake/Output Summary (Last 24 hours) at 05/26/16 1603 Last data filed at 05/26/16 1316  Gross per 24 hour  Intake               450 ml  Output              325 ml  Net              125 ml   There were no vitals filed for this visit.  Examination:  General exam:  Adult male.  No acute distress.  HEENT:  NCAT, MMM Respiratory system: Clear to auscultation bilaterally Cardiovascular system: IRRR, normal S1/S2. No murmurs, rubs, gallops or clicks.  Warm extremities Gastrointestinal system: Normal active bowel sounds, soft, nondistended, nontender. MSK:  Normal tone and bulk, 3+ pitting edema of bilateral lower extremities.  Right arm with palpable cord and 2+ pitting edema of medial aspect Neuro:  Grossly intact    Data Reviewed: I have personally reviewed following labs and imaging studies  CBC:  Recent Labs Lab 05/25/16 1512 05/26/16 0139  WBC 9.0 8.1  NEUTROABS 6.8  --   HGB 10.2* 11.7*  HCT 32.5* 36.7*  MCV 93.4 94.6  PLT 133* [id number]*   Basic Metabolic Panel:  Recent Labs Lab 05/25/16 1512 05/26/16 0139  NA 136 136  K 4.2 4.9  CL 107 107  CO2 23 21*  GLUCOSE 159* 143*  BUN 47* 46*  CREATININE 2.38* 2.31*  CALCIUM 8.6* 8.6*   GFR: CrCl cannot be calculated (Unknown ideal weight.). Liver Function Tests: No results for input(s): AST, ALT, ALKPHOS, BILITOT, PROT, ALBUMIN in the last 168 hours. No results for input(s): LIPASE, AMYLASE in the last 168 hours. No results for input(s): AMMONIA in the last 168 hours. Coagulation Profile:  Recent Labs Lab 05/25/16 1512  INR 1.10   Cardiac Enzymes: No results for input(s): CKTOTAL, CKMB, CKMBINDEX, TROPONINI in the last 168 hours. BNP (last 3 results) No results for input(s): PROBNP in the last 8760 hours. HbA1C: No results for input(s): HGBA1C in the last 72 hours. CBG:  Recent Labs Lab 05/26/16 0818 05/26/16 1255  GLUCAP 121* 111*   Lipid Profile: No results for input(s): CHOL, HDL, LDLCALC, TRIG, CHOLHDL, LDLDIRECT in the last 72 hours. Thyroid Function Tests: No results for input(s): TSH, T4TOTAL, FREET4, T3FREE,  THYROIDAB in the last 72 hours. Anemia Panel: No results for input(s): VITAMINB12, FOLATE, FERRITIN, TIBC, IRON, RETICCTPCT in the last 72 hours. Urine analysis:    Component Value Date/Time   COLORURINE YELLOW 05/25/2016 2348   APPEARANCEUR TURBID (A) 05/25/2016 2348   LABSPEC 1.015 05/25/2016 2348   LABSPEC 1.010 05/19/2015 1231   PHURINE 6.0 05/25/2016 2348   GLUCOSEU NEGATIVE 05/25/2016 2348   GLUCOSEU Negative 05/19/2015 1231   HGBUR MODERATE (A) 05/25/2016 2348   BILIRUBINUR NEGATIVE 05/25/2016 2348   BILIRUBINUR Negative 05/19/2015 1231   KETONESUR NEGATIVE 05/25/2016 2348   PROTEINUR 100 (A) 05/25/2016 2348   UROBILINOGEN 0.2 05/19/2015 1231   NITRITE POSITIVE (A) 05/25/2016 2348   LEUKOCYTESUR LARGE (A) 05/25/2016 2348   LEUKOCYTESUR Negative 05/19/2015 1231   Sepsis Labs: @LABRCNTIP (procalcitonin:4,lacticidven:4)  )No results found for this or any previous visit (from the past 240 hour(s)).    Radiology Studies: Dg Chest Portable 1 View  Result Date: 05/25/2016 CLINICAL DATA:  Shortness of breath, chest pain, dizziness. EXAM: PORTABLE CHEST 1 VIEW COMPARISON:  04/24/2016 FINDINGS: Right PICC line tip remains in the SVC, unchanged. Prior CABG. There is cardiomegaly. Small bilateral pleural effusions with bibasilar atelectasis. No overt edema. No acute bony abnormality. IMPRESSION: Small bilateral pleural effusions with bibasilar atelectasis. Cardiomegaly.  No overt failure. Electronically Signed   By: Rolm Baptise M.D.   On: 05/25/2016 15:52     Scheduled Meds: . acetaminophen  650 mg Oral BID  . amiodarone  100 mg Oral Daily  . atorvastatin  80 mg Oral QHS  . carvedilol  25 mg Oral BID WC  . ferrous sulfate  325 mg Oral Q breakfast  . [START ON 05/27/2016] furosemide  40 mg Oral Daily  . hydrALAZINE  75 mg Oral QID  . insulin aspart  0-9 Units Subcutaneous TID WC  . isosorbide mononitrate  120 mg Oral Daily  . levothyroxine  25 mcg Oral QAC breakfast  .  multivitamin with minerals  1 tablet Oral q morning - 10a  . [START ON 05/27/2016] potassium chloride SA  20 mEq Oral Daily  . senna-docusate  2 tablet Oral BID  . sodium bicarbonate  650 mg Oral BID  . tamsulosin  0.4 mg Oral QPC supper  . Warfarin - Pharmacist Dosing Inpatient   Does not apply q1800   Continuous Infusions: . heparin 1,200 Units/hr (05/26/16 1251)     LOS: 1 day    Time spent: 30 min    Janece Canterbury, MD [facility] Pager [phone]  If 7PM-7AM, please contact night-coverage [email or web address] Password [email or web address] 05/26/2016, 4:03 PM

## 2016-05-27 DIAGNOSIS — L89152 Pressure ulcer of sacral region, stage 2: Secondary | ICD-10-CM

## 2016-05-27 DIAGNOSIS — L8990 Pressure ulcer of unspecified site, unspecified stage: Secondary | ICD-10-CM | POA: Insufficient documentation

## 2016-05-27 LAB — CBC
HCT: 32.7 % — ABNORMAL LOW (ref 39.0–52.0)
Hemoglobin: 10.5 g/dL — ABNORMAL LOW (ref 13.0–17.0)
MCH: 29.8 pg (ref 26.0–34.0)
MCHC: 32.1 g/dL (ref 30.0–36.0)
MCV: 92.9 fL (ref 78.0–100.0)
PLATELETS: 158 10*3/uL (ref 150–400)
RBC: 3.52 MIL/uL — ABNORMAL LOW (ref 4.22–5.81)
RDW: 19.1 % — AB (ref 11.5–15.5)
WBC: 5.9 10*3/uL (ref 4.0–10.5)

## 2016-05-27 LAB — PROTIME-INR
INR: 1.3
PROTHROMBIN TIME: 16.3 s — AB (ref 11.4–15.2)

## 2016-05-27 LAB — BASIC METABOLIC PANEL
Anion gap: 9 (ref 5–15)
BUN: 47 mg/dL — AB (ref 6–20)
CHLORIDE: 104 mmol/L (ref 101–111)
CO2: 20 mmol/L — ABNORMAL LOW (ref 22–32)
CREATININE: 2.08 mg/dL — AB (ref 0.61–1.24)
Calcium: 8.1 mg/dL — ABNORMAL LOW (ref 8.9–10.3)
GFR calc Af Amer: 33 mL/min — ABNORMAL LOW (ref >60)
GFR, EST NON AFRICAN AMERICAN: 29 mL/min — AB (ref >60)
GLUCOSE: 106 mg/dL — AB (ref 65–99)
Potassium: 3.7 mmol/L (ref 3.5–5.1)
SODIUM: 133 mmol/L — AB (ref 135–145)

## 2016-05-27 LAB — GLUCOSE, CAPILLARY
GLUCOSE-CAPILLARY: 113 mg/dL — AB (ref 65–99)
Glucose-Capillary: 82 mg/dL (ref 65–99)
Glucose-Capillary: 153 mg/dL — ABNORMAL HIGH (ref 65–99)
Glucose-Capillary: 159 mg/dL — ABNORMAL HIGH (ref 65–99)

## 2016-05-27 LAB — HEPARIN LEVEL (UNFRACTIONATED): HEPARIN UNFRACTIONATED: 0.5 [IU]/mL (ref 0.30–0.70)

## 2016-05-27 MED ORDER — WARFARIN SODIUM 4 MG PO TABS
4.0000 mg | ORAL_TABLET | Freq: Once | ORAL | Status: AC
  Administered 2016-05-27: 4 mg via ORAL
  Filled 2016-05-27: qty 1

## 2016-05-27 MED ORDER — FUROSEMIDE 10 MG/ML IJ SOLN
80.0000 mg | Freq: Two times a day (BID) | INTRAMUSCULAR | Status: DC
  Administered 2016-05-27 – 2016-06-01 (×12): 80 mg via INTRAVENOUS
  Filled 2016-05-27 (×13): qty 8

## 2016-05-27 NOTE — Progress Notes (Addendum)
PROGRESS NOTE  [name]  [id number] DOB: July 17, 1935 DOA: 05/25/2016 PCP: [name] Pac, MD  Brief Narrative:   [name] Montee. is a 80 y.o. male with multiple medical problems detailed below who was recently discharged approximately 6 weeks ago after being treated for endocarditis involving the tricuspid valve requiring weeks weeks of IV antibiotic therapy with Ancef. The patient received the last dose of IV antibiotic on 05/25/16 and was in the process of having the PICC line removed when the family noticed that he had swelling in the right upper extremity and some tenderness in the area. The home health nurse evaluated the right upper extremity and obtained an outpatient ultrasound of the right upper extremity that was positive for DVT. The patient was sent to the emergency department. The Doppler ultrasound was not repeated however the ER doctor discussed the case with multiple specialists including cardiology and hematology and the decision was made for 6 weeks of anticoagulation.  The patient has a complicated history with bleeding. 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  He also has myelodysplastic syndrome and has biweekly Epogen injections by hematology.  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 He has paroxysmal atrial fibrillation but has not been anticoagulated because of his history of bleeding.  Admitting hospitalist had a long discussion with the patient and his wife and they have decided to proceed with anticoagulation and verbalize that they fully understand the risks. They understand that if the patient starts bleeding again that the anticoagulation will have to be discontinued. Given his chronic kidney disease hematology recommended heparin and warfarin for anticoagulation for 4-6 weeks duration of therapy.  Assessment & Plan:   Active Problems:   PAF (paroxysmal atrial fibrillation)  (HCC)   PAD (peripheral artery disease) (HCC)   Chronic diastolic heart failure (HCC)   Diabetes mellitus (HCC)   Essential hypertension   NSVT (nonsustained ventricular tachycardia) (HCC)   Anemia of chronic disease   Pacemaker   Acute renal failure superimposed on stage 4 chronic kidney disease (HCC)   Acute deep vein thrombosis (DVT) of right upper extremity (HCC)   Pressure injury of skin  Acute DVT RUE related to right upper extremity PICC line -  Continue heparin -  No evidence of bleeding so far -  Warfarin started 05/26/2016 -  Followed by Cardiology previously for INR checks   CKD stage 3/4, creatinine stable near 3.5  Essential hypertension, blood pressure elevated  -  Continue carvedilol, hydralazine, imdur -  Continue clonidine once daily (first dose is today)  Recent endocarditis of the tricuspid valve s/p 6 weeks of ancef per Dr. Johnnye Sima, ID  MDS with chronic anemia followed by Dr. Alvy Bimler -  Close outpatient hgb monitoring  Hx of PAF, managed with beta blocker, stable, now on anticoagulation  Acute on chronic diastolic heart failure with dyspnea overnight, worsening lower extremity edema on exam, and mild progressive hyponatremia -  Start IV lasix 80mg  BID -  Daily weights and strict I/O  Diabetes mellitus type 2, stable, continue SSI  Urinarlysis suggestive of infection, but patient afebrile, asymptomatic, and without leukocytosis -  Pseudomonas from urine culture -  Hold on antibiotics for now but start zosyn if he develops clinical signs of infection  Stage 2 pressure ulcer on sacrum -  allevyn -  PT eval  Normocytic anemia, previous testing within the last year demonstrated occult positive stool, evidence of elevated inflammatory  markers and elevated TSH -  Repeat TSH -  Hg approximately stable  DVT prophylaxis:  Heparin gtt Code Status:  DNR Family Communication:  Patient alone Disposition Plan:  Pending therapeutic INR, no bleeding, blood  pressure improved   Consultants:   none  Procedures:  none  Antimicrobials:   none    Subjective: Right arm feeling less painful and swollen.  Had an episode of shortness of breath overnight.  Denies chest pain.  Worsening leg swelling.    Objective: Vitals:   05/26/16 0505 05/26/16 1551 05/26/16 2257 05/27/16 0538  BP: (!) 180/78 (!) 152/62 (!) 158/68 (!) 180/88  Pulse: 71 66 65 73  Resp: 16 17 16 16   Temp: 98.3 F (36.8 C) 98.5 F (36.9 C) 98.8 F (37.1 C) 98.1 F (36.7 C)  TempSrc: Oral Oral Oral Oral  SpO2: 100% 100% 100% 100%    Intake/Output Summary (Last 24 hours) at 05/27/16 1326 Last data filed at 05/27/16 [id number]  Gross per 24 hour  Intake            622.4 ml  Output              625 ml  Net             -2.6 ml   There were no vitals filed for this visit.  Examination:  General exam:  Adult male.  No acute distress.   HEENT:  NCAT, MMM Respiratory system: Clear to auscultation bilaterally Cardiovascular system: IRRR, normal S1/S2. No murmurs, rubs, gallops or clicks.  Warm extremities Gastrointestinal system: Normal active bowel sounds, soft, nondistended, nontender. MSK:  Normal tone and bulk, 3+ pitting edema of bilateral lower extremities.  Right arm medial upper arm swelling has resolved.  Has a 20cm area of 2+ pitting edema of medial aspect just distal and slightly crossing antecubital fossa Neuro:  Grossly intact    Data Reviewed: I have personally reviewed following labs and imaging studies  CBC:  Recent Labs Lab 05/25/16 1512 05/26/16 0139 05/27/16 0503  WBC 9.0 8.1 5.9  NEUTROABS 6.8  --   --   HGB 10.2* 11.7* 10.5*  HCT 32.5* 36.7* 32.7*  MCV 93.4 94.6 92.9  PLT 133* 135* 0000000   Basic Metabolic Panel:  Recent Labs Lab 05/25/16 1512 05/26/16 0139 05/27/16 0503  NA 136 136 133*  K 4.2 4.9 3.7  CL 107 107 104  CO2 23 21* 20*  GLUCOSE 159* 143* 106*  BUN 47* 46* 47*  CREATININE 2.38* 2.31* 2.08*  CALCIUM 8.6* 8.6* 8.1*    GFR: CrCl cannot be calculated (Unknown ideal weight.). Liver Function Tests: No results for input(s): AST, ALT, ALKPHOS, BILITOT, PROT, ALBUMIN in the last 168 hours. No results for input(s): LIPASE, AMYLASE in the last 168 hours. No results for input(s): AMMONIA in the last 168 hours. Coagulation Profile:  Recent Labs Lab 05/25/16 1512 05/27/16 0503  INR 1.10 1.30   Cardiac Enzymes: No results for input(s): CKTOTAL, CKMB, CKMBINDEX, TROPONINI in the last 168 hours. BNP (last 3 results) No results for input(s): PROBNP in the last 8760 hours. HbA1C: No results for input(s): HGBA1C in the last 72 hours. CBG:  Recent Labs Lab 05/26/16 0818 05/26/16 1255 05/26/16 1707 05/27/16 0724 05/27/16 1212  GLUCAP 121* 111* 151* 82 153*   Lipid Profile: No results for input(s): CHOL, HDL, LDLCALC, TRIG, CHOLHDL, LDLDIRECT in the last 72 hours. Thyroid Function Tests: No results for input(s): TSH, T4TOTAL, FREET4, T3FREE, THYROIDAB in the last 72  hours. Anemia Panel: No results for input(s): VITAMINB12, FOLATE, FERRITIN, TIBC, IRON, RETICCTPCT in the last 72 hours. Urine analysis:    Component Value Date/Time   COLORURINE YELLOW 05/25/2016 2348   APPEARANCEUR TURBID (A) 05/25/2016 2348   LABSPEC 1.015 05/25/2016 2348   LABSPEC 1.010 05/19/2015 1231   PHURINE 6.0 05/25/2016 2348   GLUCOSEU NEGATIVE 05/25/2016 2348   GLUCOSEU Negative 05/19/2015 1231   HGBUR MODERATE (A) 05/25/2016 2348   BILIRUBINUR NEGATIVE 05/25/2016 2348   BILIRUBINUR Negative 05/19/2015 1231   KETONESUR NEGATIVE 05/25/2016 2348   PROTEINUR 100 (A) 05/25/2016 2348   UROBILINOGEN 0.2 05/19/2015 1231   NITRITE POSITIVE (A) 05/25/2016 2348   LEUKOCYTESUR LARGE (A) 05/25/2016 2348   LEUKOCYTESUR Negative 05/19/2015 1231   Sepsis Labs: @LABRCNTIP (procalcitonin:4,lacticidven:4)  ) Recent Results (from the past 240 hour(s))  Culture, Urine     Status: Abnormal (Preliminary result)   Collection Time:  05/25/16 11:48 PM  Result Value Ref Range Status   Specimen Description URINE, CLEAN CATCH  Final   Special Requests NONE  Final   Culture >=100,000 COLONIES/mL PSEUDOMONAS AERUGINOSA (A)  Final   Report Status PENDING  Incomplete  MRSA PCR Screening     Status: None   Collection Time: 05/26/16 11:34 AM  Result Value Ref Range Status   MRSA by PCR NEGATIVE NEGATIVE Final    Comment:        The GeneXpert MRSA Assay (FDA approved for NASAL specimens only), is one component of a comprehensive MRSA colonization surveillance program. It is not intended to diagnose MRSA infection nor to guide or monitor treatment for MRSA infections.       Radiology Studies: Dg Chest Portable 1 View  Result Date: 05/25/2016 CLINICAL DATA:  Shortness of breath, chest pain, dizziness. EXAM: PORTABLE CHEST 1 VIEW COMPARISON:  04/24/2016 FINDINGS: Right PICC line tip remains in the SVC, unchanged. Prior CABG. There is cardiomegaly. Small bilateral pleural effusions with bibasilar atelectasis. No overt edema. No acute bony abnormality. IMPRESSION: Small bilateral pleural effusions with bibasilar atelectasis. Cardiomegaly.  No overt failure. Electronically Signed   By: Rolm Baptise M.D.   On: 05/25/2016 15:52     Scheduled Meds: . acetaminophen  650 mg Oral BID  . amiodarone  100 mg Oral Daily  . atorvastatin  80 mg Oral QHS  . carvedilol  25 mg Oral BID WC  . cloNIDine  0.1 mg Oral Daily  . ferrous sulfate  325 mg Oral Q breakfast  . furosemide  80 mg Intravenous BID  . hydrALAZINE  75 mg Oral QID  . insulin aspart  0-9 Units Subcutaneous TID WC  . isosorbide mononitrate  120 mg Oral Daily  . levothyroxine  25 mcg Oral QAC breakfast  . multivitamin with minerals  1 tablet Oral q morning - 10a  . potassium chloride SA  20 mEq Oral Daily  . senna-docusate  2 tablet Oral BID  . sodium bicarbonate  650 mg Oral BID  . tamsulosin  0.4 mg Oral QPC supper  . warfarin  4 mg Oral ONCE-1800  . Warfarin -  Pharmacist Dosing Inpatient   Does not apply q1800   Continuous Infusions: . heparin 1,200 Units/hr (05/27/16 0827)     LOS: 2 days    Time spent: 30 min    Janece Canterbury, MD [facility] Pager [phone]  If 7PM-7AM, please contact night-coverage [email or web address] Password [email or web address] 05/27/2016, 1:26 PM

## 2016-05-27 NOTE — Progress Notes (Signed)
Physical Therapy Treatment Patient Details Name: [name] Sr. MRN: [id number] DOB: [birth year] Today's Date: 05/27/2016    History of Present Illness 80 yo male admitted with acute DVT R UE. Hx of DVT, CKD, P Afib, HF, MDS, osteopenia, HTN, hemorrhagic cystitis, anemia.    PT Comments    Progressing with mobility. Pt requires the most assistance to rise from sitting. He tolerated increased ambulation distance well.   Follow Up Recommendations  Home health PT;Supervision/Assistance - 24 hour     Equipment Recommendations  None recommended by PT    Recommendations for Other Services       Precautions / Restrictions Precautions Precautions: Fall Restrictions Weight Bearing Restrictions: No    Mobility  Bed Mobility Overal bed mobility: Needs Assistance Bed Mobility: Supine to Sit;Sit to Supine     Supine to sit: HOB elevated;Min guard Sit to supine: Min assist;HOB elevated   General bed mobility comments: Assist to get LEs onto bed. Increased time. Pt relied on bedrail as well.   Transfers Overall transfer level: Needs assistance Equipment used: Rolling walker (2 wheeled) Transfers: Sit to/from Stand Sit to Stand: Mod assist         General transfer comment: Assist to rise, stabilize, control descent. Multiple attempts to get to standing from elevated bed. Cues for "nose over toes"/anterior weightshifting.   Ambulation/Gait Ambulation/Gait assistance: Min assist;Min guard Ambulation Distance (Feet): 135 Feet Assistive device: Rolling walker (2 wheeled) Gait Pattern/deviations: Step-through pattern;Decreased stride length     General Gait Details: Assist to stabilize intermittently. Pt tolerated increased distance well.    Stairs            Wheelchair Mobility    Modified Rankin (Stroke Patients Only)       Balance                                    Cognition Arousal/Alertness: Awake/alert Behavior During Therapy: WFL  for tasks assessed/performed Overall Cognitive Status: Within Functional Limits for tasks assessed                      Exercises General Exercises - Lower Extremity Long Arc Quad: AROM;Both;10 reps;Seated Hip ABduction/ADduction: AROM;Both;10 reps;Seated    General Comments        Pertinent Vitals/Pain Pain Assessment: No/denies pain    Home Living                      Prior Function            PT Goals (current goals can now be found in the care plan section) Progress towards PT goals: Progressing toward goals    Frequency    Min 3X/week      PT Plan Current plan remains appropriate    Co-evaluation             End of Session Equipment Utilized During Treatment: Gait belt Activity Tolerance: Patient tolerated treatment well Patient left: in bed;with call bell/phone within reach     Time: 1046-1108 PT Time Calculation (min) (ACUTE ONLY): 22 min  Charges:  $Gait Training: 8-22 mins                    G Codes:      Weston Anna, MPT Pager: [phone]

## 2016-05-27 NOTE — Progress Notes (Signed)
ANTICOAGULATION CONSULT NOTE -   Pharmacy Consult for Heparin and warfarin Indication: DVT  Allergies  Allergen Reactions  . Nsaids Nausea Only    GI issue  . Ibuprofen Other (See Comments)    GI Issues  . Ace Inhibitors Cough    Patient Measurements:   Heparin Dosing Weight: actual body weight Last recorded weight = 73.5 kg (05/07/16)  Vital Signs: Temp: 98.1 F (36.7 C) (11/16 0538) Temp Source: Oral (11/16 0538) BP: 180/88 (11/16 0538) Pulse Rate: 73 (11/16 0538)  Labs:  Recent Labs  05/25/16 1512 05/25/16 1557 05/26/16 0139 05/26/16 1401 05/27/16 0503  HGB 10.2*  --  11.7*  --  10.5*  HCT 32.5*  --  36.7*  --  32.7*  PLT 133*  --  135*  --  158  APTT  --  39*  --   --   --   LABPROT 14.2  --   --   --  16.3*  INR 1.10  --   --   --  1.30  HEPARINUNFRC  --   --  0.42 0.32 0.50  CREATININE 2.38*  --  2.31*  --  2.08*    CrCl cannot be calculated (Unknown ideal weight.).   Medical History: Past Medical History:  Diagnosis Date  . Angiomyolipoma 2009   On both kidneys noted in 2009  . Arthritis    neck and left wrist  . Atrial fibrillation ([place])   . CAD (coronary artery disease)    s/b CABG 1994, and subsequent stents. Repeat CABG 12/2011,  . Chronic diastolic heart failure ([place])   . CKD (chronic kidney disease)   . Clostridium difficile diarrhea 03/2016  . Depressive disorder   . Diabetes mellitus type 2 with peripheral artery disease (HCC)    DIET CONTROLLED  . DVT (deep venous thrombosis) ([place]) 2011   Right arm  . Gastroparesis   . GERD (gastroesophageal reflux disease)   . Gout   . History of hiatal hernia   . Hyperlipidemia   . Hypertension   . Internal hemorrhoids without mention of complication   . Ischemic colitis ([place])   . 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 Myelodysplastic syndrome ([place]) 05/22/2013   With low hemoglobin and platelets treated with Procrit  . Osteopenia   . Peptic ulcer    S/p partial gastrectomy in 1969  . Peripheral  artery disease ([place])   . Pneumonia 01/16/2016  . Pneumonia 03/2016  . Presence of permanent cardiac pacemaker   . Prostate cancer ([place]) 1997   XRT and lupron  . Renal artery stenosis ([place])   . Sick sinus syndrome ([place])   . Vitamin B 12 deficiency     Medications:  Scheduled:  . acetaminophen  650 mg Oral BID  . amiodarone  100 mg Oral Daily  . atorvastatin  80 mg Oral QHS  . carvedilol  25 mg Oral BID WC  . cloNIDine  0.1 mg Oral Daily  . ferrous sulfate  325 mg Oral Q breakfast  . furosemide  80 mg Oral Daily  . hydrALAZINE  75 mg Oral QID  . insulin aspart  0-9 Units Subcutaneous TID WC  . isosorbide mononitrate  120 mg Oral Daily  . levothyroxine  25 mcg Oral QAC breakfast  . multivitamin with minerals  1 tablet Oral q morning - 10a  . potassium chloride SA  20 mEq Oral Daily  . senna-docusate  2 tablet Oral BID  . sodium bicarbonate  650 mg Oral  BID  . tamsulosin  0.4 mg Oral QPC supper  . Warfarin - Pharmacist Dosing Inpatient   Does not apply q1800   Infusions:  . heparin 1,200 Units/hr (05/26/16 1251)    Assessment:  80 yr male with ultrasound confirmed right arm DVT  PMH significant for prostate cancer, PICC line currently in right arm for IV antibiotics, AFib, CAD, CKD, DM, H/O DVT (2011), Myelodysplastic syndrome, h/o GI bleed (08/2015)  Pharmacy consulted to start warfarin  05/27/2016 INR 1.3, subtherapeutic as expected after 1 dose of warfarin Heparin level 0.5,  Therapeutic H/H low but stable, Plts improved WNL Scr 2.08 DDI :amiodarone> may inhibit hepatic metabolism and increase the anticoagulation effect of warfarin.  No issues per RN  Goal of Therapy:  INR 2-3 Heparin level 0.3-0.7 units/ml Monitor platelets by anticoagulation protocol: Yes   Plan:   Warfarin 4mg  po x 1  Daily INR  Continue  heparin infusion @ 1200 units/hr  Follow daily heparin level & CBC  Will need 5 day overlap of heparin and warfarin and INR therapeutic x 24 hours  before stopping heparin.  Pharmacy to provide warfarin education  Dolly Rias RPh 05/27/2016, 8:17 AM Pager [phone]

## 2016-05-28 ENCOUNTER — Encounter: Payer: Self-pay | Admitting: Pharmacist

## 2016-05-28 DIAGNOSIS — N179 Acute kidney failure, unspecified: Secondary | ICD-10-CM

## 2016-05-28 LAB — CBC
HEMATOCRIT: 33.8 % — AB (ref 39.0–52.0)
HEMOGLOBIN: 10.7 g/dL — AB (ref 13.0–17.0)
MCH: 29.6 pg (ref 26.0–34.0)
MCHC: 31.7 g/dL (ref 30.0–36.0)
MCV: 93.6 fL (ref 78.0–100.0)
Platelets: 215 10*3/uL (ref 150–400)
RBC: 3.61 MIL/uL — AB (ref 4.22–5.81)
RDW: 19.3 % — ABNORMAL HIGH (ref 11.5–15.5)
WBC: 6.7 10*3/uL (ref 4.0–10.5)

## 2016-05-28 LAB — BASIC METABOLIC PANEL
ANION GAP: 6 (ref 5–15)
BUN: 48 mg/dL — ABNORMAL HIGH (ref 6–20)
CALCIUM: 8.5 mg/dL — AB (ref 8.9–10.3)
CO2: 23 mmol/L (ref 22–32)
Chloride: 107 mmol/L (ref 101–111)
Creatinine, Ser: 2.13 mg/dL — ABNORMAL HIGH (ref 0.61–1.24)
GFR, EST AFRICAN AMERICAN: 32 mL/min — AB (ref >60)
GFR, EST NON AFRICAN AMERICAN: 28 mL/min — AB (ref >60)
Glucose, Bld: 104 mg/dL — ABNORMAL HIGH (ref 65–99)
POTASSIUM: 3.4 mmol/L — AB (ref 3.5–5.1)
SODIUM: 136 mmol/L (ref 135–145)

## 2016-05-28 LAB — GLUCOSE, CAPILLARY
GLUCOSE-CAPILLARY: 98 mg/dL (ref 65–99)
GLUCOSE-CAPILLARY: 135 mg/dL — AB (ref 65–99)
GLUCOSE-CAPILLARY: 151 mg/dL — AB (ref 65–99)
Glucose-Capillary: 152 mg/dL — ABNORMAL HIGH (ref 65–99)

## 2016-05-28 LAB — URINE CULTURE

## 2016-05-28 LAB — HEPARIN LEVEL (UNFRACTIONATED): HEPARIN UNFRACTIONATED: 0.63 [IU]/mL (ref 0.30–0.70)

## 2016-05-28 LAB — PROTIME-INR
INR: 1.24
Prothrombin Time: 15.7 seconds — ABNORMAL HIGH (ref 11.4–15.2)

## 2016-05-28 MED ORDER — WARFARIN SODIUM 6 MG PO TABS
6.0000 mg | ORAL_TABLET | Freq: Once | ORAL | Status: AC
  Administered 2016-05-28: 6 mg via ORAL
  Filled 2016-05-28: qty 1

## 2016-05-28 MED ORDER — CLONIDINE HCL 0.1 MG PO TABS
0.1000 mg | ORAL_TABLET | Freq: Two times a day (BID) | ORAL | Status: DC
  Administered 2016-05-28 – 2016-05-29 (×3): 0.1 mg via ORAL
  Filled 2016-05-28 (×3): qty 1

## 2016-05-28 NOTE — Progress Notes (Signed)
ANTICOAGULATION CONSULT NOTE -   Pharmacy Consult for Heparin and warfarin Indication: DVT  Allergies  Allergen Reactions  . Nsaids Nausea Only    GI issue  . Ibuprofen Other (See Comments)    GI Issues  . Ace Inhibitors Cough    Patient Measurements: Height: 5\' 10"  (177.8 cm) Weight: 182 lb 15.7 oz (83 kg) IBW/kg (Calculated) : 73 Heparin Dosing Weight: actual body weight Last recorded weight = 73.5 kg (05/07/16)  Vital Signs: Temp: 98.4 F (36.9 C) (11/17 0534) Temp Source: Oral (11/17 0534) BP: 202/76 (11/17 0534) Pulse Rate: 74 (11/17 0534)  Labs:  Recent Labs  05/25/16 1512 05/25/16 1557  05/26/16 0139 05/26/16 1401 05/27/16 0503 05/28/16 0449  HGB 10.2*  --   --  11.7*  --  10.5* 10.7*  HCT 32.5*  --   --  36.7*  --  32.7* 33.8*  PLT 133*  --   --  135*  --  158 215  APTT  --  39*  --   --   --   --   --   LABPROT 14.2  --   --   --   --  16.3* 15.7*  INR 1.10  --   --   --   --  1.30 1.24  HEPARINUNFRC  --   --   < > 0.42 0.32 0.50 0.63  CREATININE 2.38*  --   --  2.31*  --  2.08* 2.13*  < > = values in this interval not displayed.  Estimated Creatinine Clearance: 29.5 mL/min (by C-G formula based on SCr of 2.13 mg/dL (H)).   Medical History: Past Medical History:  Diagnosis Date  . Angiomyolipoma 2009   On both kidneys noted in 2009  . Arthritis    neck and left wrist  . Atrial fibrillation ([place])   . CAD (coronary artery disease)    s/b CABG 1994, and subsequent stents. Repeat CABG 12/2011,  . Chronic diastolic heart failure ([place])   . CKD (chronic kidney disease)   . Clostridium difficile diarrhea 03/2016  . Depressive disorder   . Diabetes mellitus type 2 with peripheral artery disease (HCC)    DIET CONTROLLED  . DVT (deep venous thrombosis) ([place]) 2011   Right arm  . Gastroparesis   . GERD (gastroesophageal reflux disease)   . Gout   . History of hiatal hernia   . Hyperlipidemia   . Hypertension   . Internal hemorrhoids without  mention of complication   . Ischemic colitis ([place])   . 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 Myelodysplastic syndrome ([place]) 05/22/2013   With low hemoglobin and platelets treated with Procrit  . Osteopenia   . Peptic ulcer    S/p partial gastrectomy in 1969  . Peripheral artery disease ([place])   . Pneumonia 01/16/2016  . Pneumonia 03/2016  . Presence of permanent cardiac pacemaker   . Prostate cancer ([place]) 1997   XRT and lupron  . Renal artery stenosis ([place])   . Sick sinus syndrome ([place])   . Vitamin B 12 deficiency     Medications:  Scheduled:  . acetaminophen  650 mg Oral BID  . amiodarone  100 mg Oral Daily  . atorvastatin  80 mg Oral QHS  . carvedilol  25 mg Oral BID WC  . cloNIDine  0.1 mg Oral BID  . ferrous sulfate  325 mg Oral Q breakfast  . furosemide  80 mg Intravenous BID  . hydrALAZINE  75 mg Oral QID  .  insulin aspart  0-9 Units Subcutaneous TID WC  . isosorbide mononitrate  120 mg Oral Daily  . levothyroxine  25 mcg Oral QAC breakfast  . multivitamin with minerals  1 tablet Oral q morning - 10a  . potassium chloride SA  20 mEq Oral Daily  . senna-docusate  2 tablet Oral BID  . sodium bicarbonate  650 mg Oral BID  . tamsulosin  0.4 mg Oral QPC supper  . Warfarin - Pharmacist Dosing Inpatient   Does not apply q1800   Infusions:  . heparin 1,200 Units/hr (05/28/16 0454)    Assessment:  80 yr male with ultrasound confirmed right arm DVT  PMH significant for prostate cancer, PICC line currently in right arm for IV antibiotics, AFib, CAD, CKD, DM, H/O DVT (2011), Myelodysplastic syndrome, h/o GI bleed (08/2015)  Pharmacy consulted to start warfarin  05/28/2016  INR 1.24, subtherapeutic  Heparin level 0.63,  Therapeutic  H/H low but stable, Plts improved WNL  Scr 2.13, CKD  DDI :amiodarone> may inhibit hepatic metabolism and increase the anticoagulation effect of warfarin.   No issues per RN  Goal of Therapy:  INR 2-3 Heparin level 0.3-0.7 units/ml Monitor  platelets by anticoagulation protocol: Yes   Plan:   Warfarin 6mg  po x 1  Daily INR  Continue  heparin infusion @ 1200 units/hr  Follow daily heparin level & CBC  Will need 5 day overlap of heparin and warfarin and INR therapeutic x 24 hours before stopping heparin.   Dolly Rias RPh 05/28/2016, 9:33 AM Pager [phone]

## 2016-05-28 NOTE — Progress Notes (Signed)
SCr elevated on labs from 11/12 (2.34 and was 1.68 on 11/6). Was asked by Dr. Johnnye Sima to alert PCP, but patient is in hospital now. Will f/u.

## 2016-05-28 NOTE — Progress Notes (Signed)
Physical Therapy Treatment Patient Details Name: [name] PERSAD Sr. MRN: [id number] DOB: 09-Mar-1936 Today's Date: 05/28/2016    History of Present Illness 80 yo male admitted with acute DVT R UE. Hx of DVT, CKD, P Afib, HF, MDS, osteopenia, HTN, hemorrhagic cystitis, anemia.    PT Comments    Assisted OOB to amb a greater distance in hallway.    Follow Up Recommendations  Home health PT;Supervision/Assistance - 24 hour     Equipment Recommendations  None recommended by PT    Recommendations for Other Services       Precautions / Restrictions Precautions Precautions: Fall Restrictions Weight Bearing Restrictions: No    Mobility  Bed Mobility Overal bed mobility: Needs Assistance Bed Mobility: Supine to Sit;Sit to Supine     Supine to sit: HOB elevated;Min guard Sit to supine: Min assist;HOB elevated   General bed mobility comments: Assist to get LEs onto bed. Increased time. Pt relied on bedrail as well.   Transfers Overall transfer level: Needs assistance Equipment used: Rolling walker (2 wheeled) Transfers: Sit to/from Stand Sit to Stand: Min assist;Mod assist         General transfer comment: Assist to rise, stabilize, control descent. Multiple attempts to get to standing from elevated bed.   VC's for proper hand placement to push up vs pull up on walker.   Ambulation/Gait Ambulation/Gait assistance: Min guard;Supervision Ambulation Distance (Feet): 135 Feet Assistive device: Rolling walker (2 wheeled) Gait Pattern/deviations: Step-through pattern;Trunk flexed Gait velocity: decreased   General Gait Details: Assist to stabilize intermittently. Pt tolerated increased distance well. feels "less tired"   Financial trader Rankin (Stroke Patients Only)       Balance                                    Cognition Arousal/Alertness: Awake/alert Behavior During Therapy: Flat affect Overall  Cognitive Status: Within Functional Limits for tasks assessed                      Exercises      General Comments        Pertinent Vitals/Pain Pain Assessment: No/denies pain    Home Living                      Prior Function            PT Goals (current goals can now be found in the care plan section) Progress towards PT goals: Progressing toward goals    Frequency    Min 3X/week      PT Plan Current plan remains appropriate    Co-evaluation             End of Session Equipment Utilized During Treatment: Gait belt Activity Tolerance: Patient tolerated treatment well       Time: [id number] PT Time Calculation (min) (ACUTE ONLY): 19 min  Charges:  $Gait Training: 8-22 mins                    G Codes:      Rica Koyanagi  PTA [facility]  Acute  Rehab Pager      [phone]

## 2016-05-28 NOTE — Progress Notes (Addendum)
PROGRESS NOTE  [name]  [id number] DOB: 30-Jul-1935 DOA: 05/25/2016 PCP: [name], MD  Brief Narrative:   [name] Yoos. is a 80 y.o. male with multiple medical problems detailed below who was recently discharged approximately 6 weeks ago after being treated for endocarditis involving the tricuspid valve requiring weeks weeks of IV antibiotic therapy with Ancef. The patient received the last dose of IV antibiotic on 05/25/16 and was in the process of having the PICC line removed when the family noticed that he had swelling in the right upper extremity and some tenderness in the area. The home health nurse evaluated the right upper extremity and obtained an outpatient ultrasound of the right upper extremity that was positive for DVT. The patient was sent to the emergency department. The Doppler ultrasound was not repeated however the ER doctor discussed the case with multiple specialists including cardiology and hematology and the decision was made for 6 weeks of anticoagulation.  The patient has a complicated history with bleeding. 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  He also has myelodysplastic syndrome and has biweekly Epogen injections by hematology.  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 He has paroxysmal atrial fibrillation but has not been anticoagulated because of his history of bleeding.  Admitting hospitalist had a long discussion with the patient and his wife and they decided to proceed with anticoagulation.  Given his chronic kidney disease hematology recommended heparin bridge to warfarin for anticoagulation for 4-6 weeks duration of therapy.    Assessment & Plan:   Active Problems:   PAF (paroxysmal atrial fibrillation) (HCC)   PAD (peripheral artery disease) (HCC)   Chronic diastolic heart failure (HCC)   Diabetes mellitus (HCC)   Essential hypertension   NSVT (nonsustained  ventricular tachycardia) (HCC)   Anemia of chronic disease   Pacemaker   Acute renal failure superimposed on stage 4 chronic kidney disease (HCC)   Acute deep vein thrombosis (DVT) of right upper extremity (HCC)   Pressure injury of skin  Acute DVT RUE related to right upper extremity PICC line, INR 1.24 -  Continue heparin -  No evidence of bleeding so far -  Warfarin started 05/26/2016 -  Followed by Cardiology previously for INR checks   CKD stage 3/4, creatinine trended down to 2.13  Essential hypertension, blood pressure persistently elevated  -  Continue carvedilol, hydralazine, imdur -  Increase clonidine to twice daily  Recent endocarditis of the tricuspid valve s/p 6 weeks of ancef per Dr. Johnnye Sima, ID  MDS with chronic anemia followed by Dr. Alvy Bimler, hemoglobin 74 stable -  Close outpatient hgb monitoring  Normocytic anemia, previous testing within the last year demonstrated occult positive stool, evidence of elevated inflammatory markers and elevated TSH -  Repeat TSH -  Hg approximately stable  Hx of PAF, managed with beta blocker, stable, now on anticoagulation  Acute on chronic diastolic heart failure, edema, shortness of breath, and hyponatremia improving -  Continue IV lasix 80mg  BID -  Daily weights and strict I/O  Diabetes mellitus type 2, stable, continue SSI  Urinalysis suggestive of infection, but patient afebrile, asymptomatic, and without leukocytosis.  Do not want to cause resistance by unnecessarily giving antibiotics.   -  Pseudomonas from urine culture -  Hold on antibiotics for now but start zosyn if he develops clinical signs of infection  Stage 2 pressure ulcer on sacrum -  allevyn -  PT eval  DVT prophylaxis:  Heparin gtt Code Status:  DNR Family Communication:  Patient alone Disposition Plan:  Pending therapeutic INR, no bleeding, blood pressure improved   Consultants:   none  Procedures:  none  Antimicrobials:   none     Subjective: Right arm much less swollen. Left arm had some swelling that spontaneously resolved over the last 4 hours. Decreased shortness of breath.  States that he has had intermittent chest pains that are mild, substernal without radiation the last for several minutes. These occur both at rest and with exertion and have been present for many months. They have improved over the last 2 months. They are not associated with shortness of breath, nausea, lightheadedness. He used to take nitroglycerin for these pains but does not do that any longer. He is followed by Dr. Daneen Schick with cardiology.    Objective: Vitals:   05/28/16 0454 05/28/16 0534 05/28/16 1030 05/28/16 1333  BP:  (!) 202/76 (!) 181/82 (!) 172/69  Pulse:  74  71  Resp:  16  16  Temp:  98.4 F (36.9 C)  98.4 F (36.9 C)  TempSrc:  Oral  Oral  SpO2:  100%  99%  Weight: 83 kg (182 lb 15.7 oz)     Height:        Intake/Output Summary (Last 24 hours) at 05/28/16 1353 Last data filed at 05/28/16 1333  Gross per 24 hour  Intake             1120 ml  Output             2075 ml  Net             -955 ml   Filed Weights   05/27/16 1422 05/28/16 0454  Weight: 82.9 kg (182 lb 12.2 oz) 83 kg (182 lb 15.7 oz)    Examination:  General exam:  Adult male.  No acute distress.   HEENT:  NCAT, MMM Respiratory system: Clear to auscultation bilaterally Cardiovascular system: IRRR, normal S1/S2. No murmurs, rubs, gallops or clicks.  Warm extremities Gastrointestinal system: Normal active bowel sounds, soft, nondistended, nontender. MSK:  Normal tone and bulk, 2+ pitting edema of bilateral lower extremities.  Right arm swelling has resolved.   Neuro:  Grossly intact    Data Reviewed: I have personally reviewed following labs and imaging studies  CBC:  Recent Labs Lab 05/25/16 1512 05/26/16 0139 05/27/16 0503 05/28/16 0449  WBC 9.0 8.1 5.9 6.7  NEUTROABS 6.8  --   --   --   HGB 10.2* 11.7* 10.5* 10.7*  HCT 32.5*  36.7* 32.7* 33.8*  MCV 93.4 94.6 92.9 93.6  PLT 133* 135* 158 [id number]   Basic Metabolic Panel:  Recent Labs Lab 05/25/16 1512 05/26/16 0139 05/27/16 0503 05/28/16 0449  NA 136 136 133* 136  K 4.2 4.9 3.7 3.4*  CL 107 107 104 107  CO2 23 21* 20* 23  GLUCOSE 159* 143* 106* 104*  BUN 47* 46* 47* 48*  CREATININE 2.38* 2.31* 2.08* 2.13*  CALCIUM 8.6* 8.6* 8.1* 8.5*   GFR: Estimated Creatinine Clearance: 29.5 mL/min (by C-G formula based on SCr of 2.13 mg/dL (H)). Liver Function Tests: No results for input(s): AST, ALT, ALKPHOS, BILITOT, PROT, ALBUMIN in the last 168 hours. No results for input(s): LIPASE, AMYLASE in the last 168 hours. No results for input(s): AMMONIA in the last 168 hours. Coagulation Profile:  Recent Labs Lab 05/25/16 1512 05/27/16 0503 05/28/16 0449  INR 1.10  1.30 1.24   Cardiac Enzymes: No results for input(s): CKTOTAL, CKMB, CKMBINDEX, TROPONINI in the last 168 hours. BNP (last 3 results) No results for input(s): PROBNP in the last 8760 hours. HbA1C: No results for input(s): HGBA1C in the last 72 hours. CBG:  Recent Labs Lab 05/27/16 1212 05/27/16 1611 05/27/16 2039 05/28/16 0729 05/28/16 1159  GLUCAP 153* 159* 113* 98 135*   Lipid Profile: No results for input(s): CHOL, HDL, LDLCALC, TRIG, CHOLHDL, LDLDIRECT in the last 72 hours. Thyroid Function Tests: No results for input(s): TSH, T4TOTAL, FREET4, T3FREE, THYROIDAB in the last 72 hours. Anemia Panel: No results for input(s): VITAMINB12, FOLATE, FERRITIN, TIBC, IRON, RETICCTPCT in the last 72 hours. Urine analysis:    Component Value Date/Time   COLORURINE YELLOW 05/25/2016 2348   APPEARANCEUR TURBID (A) 05/25/2016 2348   LABSPEC 1.015 05/25/2016 2348   LABSPEC 1.010 05/19/2015 1231   PHURINE 6.0 05/25/2016 2348   GLUCOSEU NEGATIVE 05/25/2016 2348   GLUCOSEU Negative 05/19/2015 1231   HGBUR MODERATE (A) 05/25/2016 2348   BILIRUBINUR NEGATIVE 05/25/2016 2348   BILIRUBINUR Negative  05/19/2015 1231   KETONESUR NEGATIVE 05/25/2016 2348   PROTEINUR 100 (A) 05/25/2016 2348   UROBILINOGEN 0.2 05/19/2015 1231   NITRITE POSITIVE (A) 05/25/2016 2348   LEUKOCYTESUR LARGE (A) 05/25/2016 2348   LEUKOCYTESUR Negative 05/19/2015 1231   Sepsis Labs: @LABRCNTIP (procalcitonin:4,lacticidven:4)  ) Recent Results (from the past 240 hour(s))  Culture, Urine     Status: Abnormal   Collection Time: 05/25/16 11:48 PM  Result Value Ref Range Status   Specimen Description URINE, CLEAN CATCH  Final   Special Requests NONE  Final   Culture >=100,000 COLONIES/mL PSEUDOMONAS AERUGINOSA (A)  Final   Report Status 05/28/2016 FINAL  Final   Organism ID, Bacteria PSEUDOMONAS AERUGINOSA (A)  Final      Susceptibility   Pseudomonas aeruginosa - MIC*    CEFTAZIDIME 4 SENSITIVE Sensitive     CIPROFLOXACIN <=0.25 SENSITIVE Sensitive     GENTAMICIN <=1 SENSITIVE Sensitive     IMIPENEM 1 SENSITIVE Sensitive     PIP/TAZO <=4 SENSITIVE Sensitive     CEFEPIME 2 SENSITIVE Sensitive     * >=100,000 COLONIES/mL PSEUDOMONAS AERUGINOSA  MRSA PCR Screening     Status: None   Collection Time: 05/26/16 11:34 AM  Result Value Ref Range Status   MRSA by PCR NEGATIVE NEGATIVE Final    Comment:        The GeneXpert MRSA Assay (FDA approved for NASAL specimens only), is one component of a comprehensive MRSA colonization surveillance program. It is not intended to diagnose MRSA infection nor to guide or monitor treatment for MRSA infections.       Radiology Studies: No results found.   Scheduled Meds: . acetaminophen  650 mg Oral BID  . amiodarone  100 mg Oral Daily  . atorvastatin  80 mg Oral QHS  . carvedilol  25 mg Oral BID WC  . cloNIDine  0.1 mg Oral BID  . ferrous sulfate  325 mg Oral Q breakfast  . furosemide  80 mg Intravenous BID  . hydrALAZINE  75 mg Oral QID  . insulin aspart  0-9 Units Subcutaneous TID WC  . isosorbide mononitrate  120 mg Oral Daily  . levothyroxine  25 mcg  Oral QAC breakfast  . multivitamin with minerals  1 tablet Oral q morning - 10a  . potassium chloride SA  20 mEq Oral Daily  . senna-docusate  2 tablet Oral BID  . sodium  bicarbonate  650 mg Oral BID  . tamsulosin  0.4 mg Oral QPC supper  . warfarin  6 mg Oral ONCE-1800  . Warfarin - Pharmacist Dosing Inpatient   Does not apply q1800   Continuous Infusions: . heparin 1,200 Units/hr (05/28/16 0454)     LOS: 3 days    Time spent: 30 min    Janece Canterbury, MD [facility] Pager [phone]  If 7PM-7AM, please contact night-coverage [email or web address] Password [email or web address] 05/28/2016, 1:53 PM

## 2016-05-29 LAB — BASIC METABOLIC PANEL
Anion gap: 7 (ref 5–15)
BUN: 49 mg/dL — AB (ref 6–20)
CALCIUM: 8.3 mg/dL — AB (ref 8.9–10.3)
CO2: 23 mmol/L (ref 22–32)
CREATININE: 2.21 mg/dL — AB (ref 0.61–1.24)
Chloride: 107 mmol/L (ref 101–111)
GFR calc Af Amer: 31 mL/min — ABNORMAL LOW (ref >60)
GFR, EST NON AFRICAN AMERICAN: 27 mL/min — AB (ref >60)
GLUCOSE: 117 mg/dL — AB (ref 65–99)
Potassium: 3.1 mmol/L — ABNORMAL LOW (ref 3.5–5.1)
SODIUM: 137 mmol/L (ref 135–145)

## 2016-05-29 LAB — CBC
HCT: 29.3 % — ABNORMAL LOW (ref 39.0–52.0)
Hemoglobin: 9.4 g/dL — ABNORMAL LOW (ref 13.0–17.0)
MCH: 29.9 pg (ref 26.0–34.0)
MCHC: 32.1 g/dL (ref 30.0–36.0)
MCV: 93.3 fL (ref 78.0–100.0)
PLATELETS: 212 10*3/uL (ref 150–400)
RBC: 3.14 MIL/uL — ABNORMAL LOW (ref 4.22–5.81)
RDW: 19.1 % — AB (ref 11.5–15.5)
WBC: 7 10*3/uL (ref 4.0–10.5)

## 2016-05-29 LAB — GLUCOSE, CAPILLARY
GLUCOSE-CAPILLARY: 106 mg/dL — AB (ref 65–99)
Glucose-Capillary: 142 mg/dL — ABNORMAL HIGH (ref 65–99)
Glucose-Capillary: 141 mg/dL — ABNORMAL HIGH (ref 65–99)
Glucose-Capillary: 141 mg/dL — ABNORMAL HIGH (ref 65–99)

## 2016-05-29 LAB — T4, FREE: FREE T4: 0.75 ng/dL (ref 0.61–1.12)

## 2016-05-29 LAB — HEPARIN LEVEL (UNFRACTIONATED): HEPARIN UNFRACTIONATED: 0.56 [IU]/mL (ref 0.30–0.70)

## 2016-05-29 LAB — PROTIME-INR
INR: 1.38
PROTHROMBIN TIME: 17.1 s — AB (ref 11.4–15.2)

## 2016-05-29 LAB — TSH: TSH: 33.438 u[IU]/mL — ABNORMAL HIGH (ref 0.350–4.500)

## 2016-05-29 MED ORDER — LEVOTHYROXINE SODIUM 25 MCG PO TABS
25.0000 ug | ORAL_TABLET | Freq: Once | ORAL | Status: AC
  Administered 2016-05-29: 25 ug via ORAL
  Filled 2016-05-29: qty 1

## 2016-05-29 MED ORDER — POTASSIUM CHLORIDE CRYS ER 20 MEQ PO TBCR
40.0000 meq | EXTENDED_RELEASE_TABLET | Freq: Every day | ORAL | Status: DC
  Administered 2016-05-29: 40 meq via ORAL
  Filled 2016-05-29: qty 2

## 2016-05-29 MED ORDER — LEVOTHYROXINE SODIUM 50 MCG PO TABS
50.0000 ug | ORAL_TABLET | Freq: Every day | ORAL | Status: DC
  Administered 2016-05-30 – 2016-06-03 (×5): 50 ug via ORAL
  Filled 2016-05-29 (×5): qty 1

## 2016-05-29 MED ORDER — WARFARIN SODIUM 6 MG PO TABS
6.0000 mg | ORAL_TABLET | Freq: Once | ORAL | Status: AC
  Administered 2016-05-29: 6 mg via ORAL
  Filled 2016-05-29: qty 1

## 2016-05-29 MED ORDER — CLONIDINE HCL 0.1 MG PO TABS
0.1000 mg | ORAL_TABLET | Freq: Three times a day (TID) | ORAL | Status: DC
  Administered 2016-05-29 – 2016-06-03 (×15): 0.1 mg via ORAL
  Filled 2016-05-29 (×15): qty 1

## 2016-05-29 NOTE — Progress Notes (Addendum)
PROGRESS NOTE  [name]  [id number] DOB: September 20, 1935 DOA: 05/25/2016 PCP: [name], MD  Brief Narrative:   [name] Chabra. is a 80 y.o. male with multiple medical problems detailed below who was recently discharged approximately 6 weeks ago after being treated for endocarditis involving the tricuspid valve requiring weeks weeks of IV antibiotic therapy with Ancef. The patient received the last dose of IV antibiotic on 05/25/16 and was in the process of having the PICC line removed when the family noticed that he had swelling in the right upper extremity and some tenderness in the area. The home health nurse evaluated the right upper extremity and obtained an outpatient ultrasound of the right upper extremity that was positive for DVT. The patient was sent to the emergency department. The Doppler ultrasound was not repeated however the ER doctor discussed the case with multiple specialists including cardiology and hematology and the decision was made for 6 weeks of anticoagulation.  The patient has a complicated history with bleeding. 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  He also has myelodysplastic syndrome and has biweekly Epogen injections by hematology.  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 He has paroxysmal atrial fibrillation but has not been anticoagulated because of his history of bleeding.  Admitting hospitalist had a long discussion with the patient and his wife and they decided to proceed with anticoagulation.  Given his chronic kidney disease hematology recommended heparin bridge to warfarin for anticoagulation for 4-6 weeks duration of therapy.    Assessment & Plan:   Active Problems:   PAF (paroxysmal atrial fibrillation) (HCC)   PAD (peripheral artery disease) (HCC)   Chronic diastolic heart failure (HCC)   Diabetes mellitus (HCC)   Essential hypertension   NSVT (nonsustained  ventricular tachycardia) (HCC)   Anemia of chronic disease   Pacemaker   Acute renal failure superimposed on stage 4 chronic kidney disease (HCC)   Acute deep vein thrombosis (DVT) of right upper extremity (HCC)   Pressure injury of skin  Acute DVT RUE related to right upper extremity PICC line, INR 1.38 -  Continue heparin -  No evidence of bleeding so far -  Warfarin started 05/26/2016 -  Followed by Cardiology previously for INR checks   CKD stage 3/4, creatinine trended down to 2.2  Essential hypertension, blood pressure persistently elevated  -  Continue carvedilol, hydralazine, imdur -  Increase clonidine to tid  Recent endocarditis of the tricuspid valve s/p 6 weeks of ancef per Dr. Johnnye Sima, ID  MDS with chronic anemia followed by Dr. Alvy Bimler, hemoglobin 74 stable -  Close outpatient hgb monitoring -  Needs "injection" on Monday  Normocytic anemia, previous testing within the last year demonstrated occult positive stool, evidence of elevated inflammatory markers and elevated TSH -  TSH 33.4 -  Hg approximately stable  Hx of PAF, managed with beta blocker, stable, now on anticoagulation  Acute on chronic diastolic heart failure due to IVF (heparin), edema, shortness of breath, and hyponatremia improving -  Continue IV lasix 80mg  BID -  Wt stable, I/O approximately even  Diabetes mellitus type 2, stable, continue SSI  Urinalysis suggestive of infection, but patient afebrile, asymptomatic, and without leukocytosis.  Do not want to cause resistance by unnecessarily giving antibiotics.   -  Pseudomonas from urine culture -  Hold on antibiotics for now but start zosyn if he develops clinical signs of infection  Stage 2  pressure ulcer on sacrum -  allevyn -  PT rec HH PT  Atypical chest pain.  Follow up with cardiology routine outpatient   DVT prophylaxis:  Heparin gtt Code Status:  DNR Family Communication:  Patient alone Disposition Plan:  Pending therapeutic INR,  no bleeding, blood pressure improved   Consultants:   none  Procedures:  none  Antimicrobials:   none    Subjective: Right arm much less swollen. Denies shortness of breath, nausea, constipation.  Denies blood in stools or urine.  Objective: Vitals:   05/28/16 1030 05/28/16 1333 05/28/16 2116 05/29/16 0500  BP: (!) 181/82 (!) 172/69 (!) 162/72 (!) 184/74  Pulse:  71 64 67  Resp:  16 16 16   Temp:  98.4 F (36.9 C) 98.7 F (37.1 C) 98.8 F (37.1 C)  TempSrc:  Oral Oral Oral  SpO2:  99% 100% 100%  Weight:      Height:        Intake/Output Summary (Last 24 hours) at 05/29/16 1148 Last data filed at 05/29/16 1000  Gross per 24 hour  Intake             1362 ml  Output             1750 ml  Net             -388 ml   Filed Weights   05/27/16 1422 05/28/16 0454  Weight: 82.9 kg (182 lb 12.2 oz) 83 kg (182 lb 15.7 oz)    Examination:  General exam:  Adult male.  No acute distress.   HEENT:  NCAT, MMM Respiratory system: Clear to auscultation bilaterally Cardiovascular system: IRRR, normal S1/S2. No murmurs, rubs, gallops or clicks.  Warm extremities Gastrointestinal system: Normal active bowel sounds, soft, nondistended, nontender. MSK:  Normal tone and bulk, 2+ pitting edema of bilateral lower extremities.  Right arm swelling has mostly resolved except for a 2-3cm area on medial arm just distal to antecubital fossa   Neuro:  Grossly intact    Data Reviewed: I have personally reviewed following labs and imaging studies  CBC:  Recent Labs Lab 05/25/16 1512 05/26/16 0139 05/27/16 0503 05/28/16 0449 05/29/16 0421  WBC 9.0 8.1 5.9 6.7 7.0  NEUTROABS 6.8  --   --   --   --   HGB 10.2* 11.7* 10.5* 10.7* 9.4*  HCT 32.5* 36.7* 32.7* 33.8* 29.3*  MCV 93.4 94.6 92.9 93.6 93.3  PLT 133* 135* 158 215 [id number]   Basic Metabolic Panel:  Recent Labs Lab 05/25/16 1512 05/26/16 0139 05/27/16 0503 05/28/16 0449 05/29/16 0421  NA 136 136 133* 136 137  K 4.2 4.9 3.7  3.4* 3.1*  CL 107 107 104 107 107  CO2 23 21* 20* 23 23  GLUCOSE 159* 143* 106* 104* 117*  BUN 47* 46* 47* 48* 49*  CREATININE 2.38* 2.31* 2.08* 2.13* 2.21*  CALCIUM 8.6* 8.6* 8.1* 8.5* 8.3*   GFR: Estimated Creatinine Clearance: 28.4 mL/min (by C-G formula based on SCr of 2.21 mg/dL (H)). Liver Function Tests: No results for input(s): AST, ALT, ALKPHOS, BILITOT, PROT, ALBUMIN in the last 168 hours. No results for input(s): LIPASE, AMYLASE in the last 168 hours. No results for input(s): AMMONIA in the last 168 hours. Coagulation Profile:  Recent Labs Lab 05/25/16 1512 05/27/16 0503 05/28/16 0449 05/29/16 0421  INR 1.10 1.30 1.24 1.38   Cardiac Enzymes: No results for input(s): CKTOTAL, CKMB, CKMBINDEX, TROPONINI in the last 168 hours. BNP (last 3 results) No  results for input(s): PROBNP in the last 8760 hours. HbA1C: No results for input(s): HGBA1C in the last 72 hours. CBG:  Recent Labs Lab 05/28/16 1159 05/28/16 1640 05/28/16 2032 05/29/16 0720 05/29/16 1134  GLUCAP 135* 151* 152* 106* 142*   Lipid Profile: No results for input(s): CHOL, HDL, LDLCALC, TRIG, CHOLHDL, LDLDIRECT in the last 72 hours. Thyroid Function Tests:  Recent Labs  05/29/16 0421  TSH 33.438*   Anemia Panel: No results for input(s): VITAMINB12, FOLATE, FERRITIN, TIBC, IRON, RETICCTPCT in the last 72 hours. Urine analysis:    Component Value Date/Time   COLORURINE YELLOW 05/25/2016 2348   APPEARANCEUR TURBID (A) 05/25/2016 2348   LABSPEC 1.015 05/25/2016 2348   LABSPEC 1.010 05/19/2015 1231   PHURINE 6.0 05/25/2016 2348   GLUCOSEU NEGATIVE 05/25/2016 2348   GLUCOSEU Negative 05/19/2015 1231   HGBUR MODERATE (A) 05/25/2016 2348   BILIRUBINUR NEGATIVE 05/25/2016 2348   BILIRUBINUR Negative 05/19/2015 1231   KETONESUR NEGATIVE 05/25/2016 2348   PROTEINUR 100 (A) 05/25/2016 2348   UROBILINOGEN 0.2 05/19/2015 1231   NITRITE POSITIVE (A) 05/25/2016 2348   LEUKOCYTESUR LARGE (A)  05/25/2016 2348   LEUKOCYTESUR Negative 05/19/2015 1231   Sepsis Labs: @LABRCNTIP (procalcitonin:4,lacticidven:4)  ) Recent Results (from the past 240 hour(s))  Culture, Urine     Status: Abnormal   Collection Time: 05/25/16 11:48 PM  Result Value Ref Range Status   Specimen Description URINE, CLEAN CATCH  Final   Special Requests NONE  Final   Culture >=100,000 COLONIES/mL PSEUDOMONAS AERUGINOSA (A)  Final   Report Status 05/28/2016 FINAL  Final   Organism ID, Bacteria PSEUDOMONAS AERUGINOSA (A)  Final      Susceptibility   Pseudomonas aeruginosa - MIC*    CEFTAZIDIME 4 SENSITIVE Sensitive     CIPROFLOXACIN <=0.25 SENSITIVE Sensitive     GENTAMICIN <=1 SENSITIVE Sensitive     IMIPENEM 1 SENSITIVE Sensitive     PIP/TAZO <=4 SENSITIVE Sensitive     CEFEPIME 2 SENSITIVE Sensitive     * >=100,000 COLONIES/mL PSEUDOMONAS AERUGINOSA  MRSA PCR Screening     Status: None   Collection Time: 05/26/16 11:34 AM  Result Value Ref Range Status   MRSA by PCR NEGATIVE NEGATIVE Final    Comment:        The GeneXpert MRSA Assay (FDA approved for NASAL specimens only), is one component of a comprehensive MRSA colonization surveillance program. It is not intended to diagnose MRSA infection nor to guide or monitor treatment for MRSA infections.       Radiology Studies: No results found.   Scheduled Meds: . acetaminophen  650 mg Oral BID  . amiodarone  100 mg Oral Daily  . atorvastatin  80 mg Oral QHS  . carvedilol  25 mg Oral BID WC  . cloNIDine  0.1 mg Oral BID  . ferrous sulfate  325 mg Oral Q breakfast  . furosemide  80 mg Intravenous BID  . hydrALAZINE  75 mg Oral QID  . insulin aspart  0-9 Units Subcutaneous TID WC  . isosorbide mononitrate  120 mg Oral Daily  . levothyroxine  25 mcg Oral QAC breakfast  . multivitamin with minerals  1 tablet Oral q morning - 10a  . potassium chloride SA  40 mEq Oral Daily  . senna-docusate  2 tablet Oral BID  . sodium bicarbonate  650 mg  Oral BID  . tamsulosin  0.4 mg Oral QPC supper  . warfarin  6 mg Oral ONCE-1800  . Warfarin -  Pharmacist Dosing Inpatient   Does not apply q1800   Continuous Infusions: . heparin 1,200 Units/hr (05/29/16 0253)     LOS: 4 days    Time spent: 30 min    Janece Canterbury, MD [facility] Pager [phone]  If 7PM-7AM, please contact night-coverage [email or web address] Password [email or web address] 05/29/2016, 11:48 AM

## 2016-05-29 NOTE — Progress Notes (Signed)
ANTICOAGULATION CONSULT NOTE - Follow Up  Pharmacy Consult for Heparin and warfarin Indication: DVT  Allergies  Allergen Reactions  . Nsaids Nausea Only    GI issue  . Ibuprofen Other (See Comments)    GI Issues  . Ace Inhibitors Cough    Patient Measurements: Height: 5\' 10"  (177.8 cm) Weight: 182 lb 15.7 oz (83 kg) IBW/kg (Calculated) : 73 Heparin Dosing Weight: actual body weight Last recorded weight = 73.5 kg (05/07/16)  Vital Signs: Temp: 98.8 F (37.1 C) (11/18 0500) Temp Source: Oral (11/18 0500) BP: 184/74 (11/18 0500) Pulse Rate: 67 (11/18 0500)  Labs:  Recent Labs  05/27/16 0503 05/28/16 0449 05/29/16 0421  HGB 10.5* 10.7* 9.4*  HCT 32.7* 33.8* 29.3*  PLT 158 215 212  LABPROT 16.3* 15.7* 17.1*  INR 1.30 1.24 1.38  HEPARINUNFRC 0.50 0.63 0.56  CREATININE 2.08* 2.13* 2.21*    Estimated Creatinine Clearance: 28.4 mL/min (by C-G formula based on SCr of 2.21 mg/dL (H)).   Medical History: Past Medical History:  Diagnosis Date  . Angiomyolipoma 2009   On both kidneys noted in 2009  . Arthritis    neck and left wrist  . Atrial fibrillation ([place])   . CAD (coronary artery disease)    s/b CABG 1994, and subsequent stents. Repeat CABG 12/2011,  . Chronic diastolic heart failure ([place])   . CKD (chronic kidney disease)   . Clostridium difficile diarrhea 03/2016  . Depressive disorder   . Diabetes mellitus type 2 with peripheral artery disease (HCC)    DIET CONTROLLED  . DVT (deep venous thrombosis) ([place]) 2011   Right arm  . Gastroparesis   . GERD (gastroesophageal reflux disease)   . Gout   . History of hiatal hernia   . Hyperlipidemia   . Hypertension   . Internal hemorrhoids without mention of complication   . Ischemic colitis ([place])   . 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 Myelodysplastic syndrome ([place]) 05/22/2013   With low hemoglobin and platelets treated with Procrit  . Osteopenia   . Peptic ulcer    S/p partial gastrectomy in 1969  . Peripheral  artery disease ([place])   . Pneumonia 01/16/2016  . Pneumonia 03/2016  . Presence of permanent cardiac pacemaker   . Prostate cancer ([place]) 1997   XRT and lupron  . Renal artery stenosis ([place])   . Sick sinus syndrome ([place])   . Vitamin B 12 deficiency     Medications:  Scheduled:  . acetaminophen  650 mg Oral BID  . amiodarone  100 mg Oral Daily  . atorvastatin  80 mg Oral QHS  . carvedilol  25 mg Oral BID WC  . cloNIDine  0.1 mg Oral BID  . ferrous sulfate  325 mg Oral Q breakfast  . furosemide  80 mg Intravenous BID  . hydrALAZINE  75 mg Oral QID  . insulin aspart  0-9 Units Subcutaneous TID WC  . isosorbide mononitrate  120 mg Oral Daily  . levothyroxine  25 mcg Oral QAC breakfast  . multivitamin with minerals  1 tablet Oral q morning - 10a  . potassium chloride SA  40 mEq Oral Daily  . senna-docusate  2 tablet Oral BID  . sodium bicarbonate  650 mg Oral BID  . tamsulosin  0.4 mg Oral QPC supper  . Warfarin - Pharmacist Dosing Inpatient   Does not apply q1800   Infusions:  . heparin 1,200 Units/hr (05/29/16 0253)    Assessment:  80 yr male with  ultrasound confirmed right arm DVT  PMH significant for prostate cancer, PICC line currently in right arm for IV antibiotics, AFib, CAD, CKD, DM, H/O DVT (2011), Myelodysplastic syndrome, h/o GI bleed (08/2015)  Pharmacy consulted to start warfarin  05/29/2016  INR subtherapeutic after warfarin doses of 4mg , 4mg  and 6mg   Heparin level continues to be therapeutic on current rate of 1200 units/hr  H/H low but stable, Plts improved WNL  Scr 2.21, CKD  DDI :amiodarone> may inhibit hepatic metabolism and increase the anticoagulation effect of warfarin.   No bleeding issues noted  Goal of Therapy:  INR 2-3 Heparin level 0.3-0.7 units/ml Monitor platelets by anticoagulation protocol: Yes   Plan:   Repeat warfarin 6mg  po x 1 at 6pm  Daily INR  Continue IV heparin infusion @ 1200 units/hr  Follow daily heparin level &  CBC  Will need 5 day overlap of heparin and warfarin and INR therapeutic x 24 hours before stopping heparin.   Adrian Saran, PharmD, BCPS Pager [phone] 05/29/2016 8:25 AM

## 2016-05-30 DIAGNOSIS — D72829 Elevated white blood cell count, unspecified: Secondary | ICD-10-CM

## 2016-05-30 LAB — BASIC METABOLIC PANEL
Anion gap: 8 (ref 5–15)
BUN: 51 mg/dL — AB (ref 6–20)
CHLORIDE: 107 mmol/L (ref 101–111)
CO2: 21 mmol/L — AB (ref 22–32)
CREATININE: 2.21 mg/dL — AB (ref 0.61–1.24)
Calcium: 8.2 mg/dL — ABNORMAL LOW (ref 8.9–10.3)
GFR calc Af Amer: 31 mL/min — ABNORMAL LOW (ref >60)
GFR calc non Af Amer: 27 mL/min — ABNORMAL LOW (ref >60)
Glucose, Bld: 110 mg/dL — ABNORMAL HIGH (ref 65–99)
Potassium: 2.7 mmol/L — CL (ref 3.5–5.1)
Sodium: 136 mmol/L (ref 135–145)

## 2016-05-30 LAB — URINE MICROSCOPIC-ADD ON: Squamous Epithelial / LPF: NONE SEEN

## 2016-05-30 LAB — URINALYSIS, ROUTINE W REFLEX MICROSCOPIC
Bilirubin Urine: NEGATIVE
GLUCOSE, UA: NEGATIVE mg/dL
KETONES UR: NEGATIVE mg/dL
NITRITE: NEGATIVE
PROTEIN: 100 mg/dL — AB
Specific Gravity, Urine: 1.014 (ref 1.005–1.030)
pH: 6 (ref 5.0–8.0)

## 2016-05-30 LAB — C DIFFICILE QUICK SCREEN W PCR REFLEX
C DIFFICLE (CDIFF) ANTIGEN: POSITIVE — AB
C Diff interpretation: DETECTED
C Diff toxin: POSITIVE — AB

## 2016-05-30 LAB — CBC
HCT: 31.3 % — ABNORMAL LOW (ref 39.0–52.0)
Hemoglobin: 10 g/dL — ABNORMAL LOW (ref 13.0–17.0)
MCH: 29.6 pg (ref 26.0–34.0)
MCHC: 31.9 g/dL (ref 30.0–36.0)
MCV: 92.6 fL (ref 78.0–100.0)
PLATELETS: 209 10*3/uL (ref 150–400)
RBC: 3.38 MIL/uL — AB (ref 4.22–5.81)
RDW: 19.2 % — ABNORMAL HIGH (ref 11.5–15.5)
WBC: 11.8 10*3/uL — ABNORMAL HIGH (ref 4.0–10.5)

## 2016-05-30 LAB — GLUCOSE, CAPILLARY
GLUCOSE-CAPILLARY: 118 mg/dL — AB (ref 65–99)
Glucose-Capillary: 183 mg/dL — ABNORMAL HIGH (ref 65–99)

## 2016-05-30 LAB — HEPARIN LEVEL (UNFRACTIONATED): Heparin Unfractionated: 0.49 IU/mL (ref 0.30–0.70)

## 2016-05-30 LAB — PROTIME-INR
INR: 1.39
PROTHROMBIN TIME: 17.1 s — AB (ref 11.4–15.2)

## 2016-05-30 MED ORDER — WARFARIN SODIUM 5 MG PO TABS
7.5000 mg | ORAL_TABLET | Freq: Once | ORAL | Status: AC
  Administered 2016-05-30: 7.5 mg via ORAL
  Filled 2016-05-30: qty 1

## 2016-05-30 MED ORDER — POTASSIUM CHLORIDE CRYS ER 20 MEQ PO TBCR
40.0000 meq | EXTENDED_RELEASE_TABLET | Freq: Two times a day (BID) | ORAL | Status: DC
  Administered 2016-05-30 – 2016-06-03 (×9): 40 meq via ORAL
  Filled 2016-05-30 (×9): qty 2

## 2016-05-30 MED ORDER — VANCOMYCIN 50 MG/ML ORAL SOLUTION
125.0000 mg | Freq: Four times a day (QID) | ORAL | Status: DC
  Administered 2016-05-30 – 2016-06-03 (×15): 125 mg via ORAL
  Filled 2016-05-30 (×20): qty 2.5

## 2016-05-30 MED ORDER — POTASSIUM CHLORIDE CRYS ER 10 MEQ PO TBCR: 40.0000 meq | EXTENDED_RELEASE_TABLET | Freq: Every day | ORAL | Status: DC

## 2016-05-30 MED ORDER — POTASSIUM CHLORIDE CRYS ER 20 MEQ PO TBCR: 60.0000 meq | EXTENDED_RELEASE_TABLET | ORAL | Status: DC

## 2016-05-30 MED ORDER — POTASSIUM CHLORIDE CRYS ER 10 MEQ PO TBCR
60.0000 meq | EXTENDED_RELEASE_TABLET | ORAL | Status: AC
  Administered 2016-05-30: 60 meq via ORAL
  Filled 2016-05-30: qty 6

## 2016-05-30 NOTE — Progress Notes (Signed)
Physical Therapy Treatment Patient Details Name: [name] PITTA Sr. MRN: [id number] DOB: Nov 24, 1935 Today's Date: 05/30/2016    History of Present Illness 80 yo male admitted with acute DVT R UE. Hx of DVT, CKD, P Afib, HF, MDS, osteopenia, HTN, hemorrhagic cystitis, anemia.    PT Comments    The patient very appreciative for having opportunity to ambulate. Patient reports feeling fatigued.   Follow Up Recommendations  Home health PT;Supervision/Assistance - 24 hour     Equipment Recommendations  None recommended by PT    Recommendations for Other Services       Precautions / Restrictions Precautions Precautions: Fall Precaution Comments: having  some loose BM's    Mobility  Bed Mobility   Bed Mobility: Sit to Supine       Sit to supine: Min assist   General bed mobility comments: Assist to get LEs onto bed. Increased time. Pt relied on bedrail as well.   Transfers Overall transfer level: Needs assistance Equipment used: Rolling walker (2 wheeled) Transfers: Sit to/from Stand Sit to Stand: Mod assist         General transfer comment: Assist to rise, stabilize, control descent.    VC's for proper hand placement to push up   Ambulation/Gait   74' with RW. Gait is slow, decreased stride length. Assist for turning with RW.               Stairs            Wheelchair Mobility    Modified Rankin (Stroke Patients Only)       Balance                                    Cognition Arousal/Alertness: Awake/alert Behavior During Therapy: Flat affect                        Exercises      General Comments        Pertinent Vitals/Pain Pain Assessment: No/denies pain    Home Living                      Prior Function            PT Goals (current goals can now be found in the care plan section) Progress towards PT goals: Progressing toward goals    Frequency    Min 3X/week      PT Plan  Current plan remains appropriate    Co-evaluation             End of Session Equipment Utilized During Treatment: Gait belt Activity Tolerance: Patient tolerated treatment well Patient left: in bed;with call bell/phone within reach;with bed alarm set     Time: 1551-1613 PT Time Calculation (min) (ACUTE ONLY): 22 min  Charges:  $Gait Training: 8-22 mins                    G Codes:      Claretha Cooper 05/30/2016, 4:19 PM Tresa Endo PT [phone]

## 2016-05-30 NOTE — Progress Notes (Signed)
PROGRESS NOTE  [name]  [id number] DOB: [birth year] DOA: 05/25/2016 PCP: [name], MD  Brief Narrative:   [name]. is a 80 y.o. male with multiple medical problems detailed below who was recently discharged approximately 6 weeks ago after being treated for endocarditis involving the tricuspid valve requiring weeks weeks of IV antibiotic therapy with Ancef. The patient received the last dose of IV antibiotic on 05/25/16 and was in the process of having the PICC line removed when the family noticed that he had swelling in the right upper extremity and some tenderness in the area. The home health nurse evaluated the right upper extremity and obtained an outpatient ultrasound of the right upper extremity that was positive for DVT. The patient was sent to the emergency department. The Doppler ultrasound was not repeated however the ER doctor discussed the case with multiple specialists including cardiology and hematology and the decision was made for 6 weeks of anticoagulation.  The patient has a complicated history with bleeding. 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  He also has myelodysplastic syndrome and has biweekly Epogen injections by hematology.  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 He has paroxysmal atrial fibrillation but has not been anticoagulated because of his history of bleeding.  Admitting hospitalist had a long discussion with the patient and his wife and they decided to proceed with anticoagulation.  Given his chronic kidney disease hematology recommended heparin bridge to warfarin for anticoagulation for 4-6 weeks duration of therapy.    Assessment & Plan:   Active Problems:   PAF (paroxysmal atrial fibrillation) (HCC)   PAD (peripheral artery disease) (HCC)   Chronic diastolic heart failure (HCC)   Diabetes mellitus (HCC)   Essential hypertension   NSVT (nonsustained  ventricular tachycardia) (HCC)   Anemia of chronic disease   Pacemaker   Acute renal failure superimposed on stage 4 chronic kidney disease (HCC)   Acute deep vein thrombosis (DVT) of right upper extremity (HCC)   Pressure injury of skin  Acute DVT RUE related to right upper extremity PICC line, INR 1.39 -  Continue heparin -  Warfarin per pharmacy -  No evidence of bleeding so far -  Warfarin started 05/26/2016 -  Followed by Cardiology previously for INR checks   Leukocytosis.  No respiratory symptoms.  Having some urinary urgency since starting lasix.  Had watery diarrhea many times overnight.  May be due to UTI or c. Diff diarrhea.  Recently completed treatment for C. Diff and then was on long course of antibiotics.   -  Last dose of senna was yesterday AM -  Check C. Diff if diarrhea persists  -  Repeat UA/Uc  CKD stage 3/4, creatinine trended down to 2.2  Essential hypertension, blood pressures elevated but better -  Continue carvedilol, hydralazine, imdur -  Continue clonidine to tid  Recent endocarditis of the tricuspid valve s/p 6 weeks of ancef per Dr. Johnnye Sima, ID  MDS with chronic anemia followed by Dr. Alvy Bimler, hemoglobin stable at currently at 10g/dl -  Close outpatient hgb monitoring -  If hemoglobin falls below 10g/dl, will plan to give epo injection  Normocytic anemia, previous testing within the last year demonstrated occult positive stool, evidence of elevated inflammatory markers and elevated TSH -  TSH 33.4 -  Hg approximately stable  Hx of PAF, managed with beta blocker, stable, now on anticoagulation  Acute on chronic diastolic heart  failure due to IVF (heparin), edema, shortness of breath, and hyponatremia improving -  Continue IV lasix 80mg  BID -  Wt trending down -  Will not increase lasix due to recent diarrhea  Diabetes mellitus type 2, stable, continue SSI  Urinalysis suggestive of infection, but patient afebrile, asymptomatic, and without  leukocytosis.  Do not want to cause resistance by unnecessarily giving antibiotics.   -  Pseudomonas from urine culture -  Hold on antibiotics for now but start zosyn if he develops clinical signs of infection  Stage 2 pressure ulcer on sacrum -  allevyn -  PT rec HH PT  Atypical chest pain.  Follow up with cardiology routine outpatient   Hypokalemia  -  Additional oral potassium today -  Increase to BID potassium  DVT prophylaxis:  Heparin gtt Code Status:  DNR Family Communication:  Patient alone Disposition Plan:  Pending therapeutic INR, no bleeding, blood pressure improved   Consultants:   none  Procedures:  none  Antimicrobials:   none    Subjective: Right arm much less swollen. Denies shortness of breath, nausea, constipation.  Denies blood in stools or urine.  Objective: Vitals:   05/29/16 0500 05/29/16 1520 05/29/16 2210 05/30/16 0526  BP: (!) 184/74 (!) 159/74 (!) 146/63 (!) 156/62  Pulse: 67 67 75 75  Resp: 16 16  16   Temp: 98.8 F (37.1 C) 97.9 F (36.6 C) 98.5 F (36.9 C) 98.6 F (37 C)  TempSrc: Oral Oral Oral Oral  SpO2: 100% 100% 100% 99%  Weight: 83.1 kg (183 lb 3.2 oz)   79.7 kg (175 lb 11.3 oz)  Height:        Intake/Output Summary (Last 24 hours) at 05/30/16 1315 Last data filed at 05/30/16 0839  Gross per 24 hour  Intake              720 ml  Output             1050 ml  Net             -330 ml   Filed Weights   05/28/16 0454 05/29/16 0500 05/30/16 0526  Weight: 83 kg (182 lb 15.7 oz) 83.1 kg (183 lb 3.2 oz) 79.7 kg (175 lb 11.3 oz)    Examination:  General exam:  Adult male.  No acute distress.   HEENT:  NCAT, MMM Respiratory system: Clear to auscultation bilaterally Cardiovascular system: IRRR, normal S1/S2. No murmurs, rubs, gallops or clicks.  Warm extremities Gastrointestinal system:  Hyperactive bowel sounds, soft, nondistended, nontender. MSK:  Normal tone and bulk, 2+ pitting edema of bilateral lower extremities.  Right  arm swelling has mostly resolved except for a 2-3cm area on medial arm just distal to antecubital fossa   Neuro:  Grossly intact    Data Reviewed: I have personally reviewed following labs and imaging studies  CBC:  Recent Labs Lab 05/25/16 1512 05/26/16 0139 05/27/16 0503 05/28/16 0449 05/29/16 0421 05/30/16 0412  WBC 9.0 8.1 5.9 6.7 7.0 11.8*  NEUTROABS 6.8  --   --   --   --   --   HGB 10.2* 11.7* 10.5* 10.7* 9.4* 10.0*  HCT 32.5* 36.7* 32.7* 33.8* 29.3* 31.3*  MCV 93.4 94.6 92.9 93.6 93.3 92.6  PLT 133* 135* 158 215 212 [id number]   Basic Metabolic Panel:  Recent Labs Lab 05/26/16 0139 05/27/16 0503 05/28/16 0449 05/29/16 0421 05/30/16 0412  NA 136 133* 136 137 136  K 4.9 3.7 3.4* 3.1* 2.7*  CL 107 104 107 107 107  CO2 21* 20* 23 23 21*  GLUCOSE 143* 106* 104* 117* 110*  BUN 46* 47* 48* 49* 51*  CREATININE 2.31* 2.08* 2.13* 2.21* 2.21*  CALCIUM 8.6* 8.1* 8.5* 8.3* 8.2*   GFR: Estimated Creatinine Clearance: 28.4 mL/min (by C-G formula based on SCr of 2.21 mg/dL (H)). Liver Function Tests: No results for input(s): AST, ALT, ALKPHOS, BILITOT, PROT, ALBUMIN in the last 168 hours. No results for input(s): LIPASE, AMYLASE in the last 168 hours. No results for input(s): AMMONIA in the last 168 hours. Coagulation Profile:  Recent Labs Lab 05/25/16 1512 05/27/16 0503 05/28/16 0449 05/29/16 0421 05/30/16 0412  INR 1.10 1.30 1.24 1.38 1.39   Cardiac Enzymes: No results for input(s): CKTOTAL, CKMB, CKMBINDEX, TROPONINI in the last 168 hours. BNP (last 3 results) No results for input(s): PROBNP in the last 8760 hours. HbA1C: No results for input(s): HGBA1C in the last 72 hours. CBG:  Recent Labs Lab 05/29/16 1134 05/29/16 1715 05/29/16 2209 05/30/16 0737 05/30/16 1234  GLUCAP 142* 141* 141* 118* 183*   Lipid Profile: No results for input(s): CHOL, HDL, LDLCALC, TRIG, CHOLHDL, LDLDIRECT in the last 72 hours. Thyroid Function Tests:  Recent Labs   05/29/16 0421  TSH 33.438*  FREET4 0.75   Anemia Panel: No results for input(s): VITAMINB12, FOLATE, FERRITIN, TIBC, IRON, RETICCTPCT in the last 72 hours. Urine analysis:    Component Value Date/Time   COLORURINE YELLOW 05/25/2016 2348   APPEARANCEUR TURBID (A) 05/25/2016 2348   LABSPEC 1.015 05/25/2016 2348   LABSPEC 1.010 05/19/2015 1231   PHURINE 6.0 05/25/2016 2348   GLUCOSEU NEGATIVE 05/25/2016 2348   GLUCOSEU Negative 05/19/2015 1231   HGBUR MODERATE (A) 05/25/2016 2348   BILIRUBINUR NEGATIVE 05/25/2016 2348   BILIRUBINUR Negative 05/19/2015 1231   KETONESUR NEGATIVE 05/25/2016 2348   PROTEINUR 100 (A) 05/25/2016 2348   UROBILINOGEN 0.2 05/19/2015 1231   NITRITE POSITIVE (A) 05/25/2016 2348   LEUKOCYTESUR LARGE (A) 05/25/2016 2348   LEUKOCYTESUR Negative 05/19/2015 1231   Sepsis Labs: @LABRCNTIP (procalcitonin:4,lacticidven:4)  ) Recent Results (from the past 240 hour(s))  Culture, Urine     Status: Abnormal   Collection Time: 05/25/16 11:48 PM  Result Value Ref Range Status   Specimen Description URINE, CLEAN CATCH  Final   Special Requests NONE  Final   Culture >=100,000 COLONIES/mL PSEUDOMONAS AERUGINOSA (A)  Final   Report Status 05/28/2016 FINAL  Final   Organism ID, Bacteria PSEUDOMONAS AERUGINOSA (A)  Final      Susceptibility   Pseudomonas aeruginosa - MIC*    CEFTAZIDIME 4 SENSITIVE Sensitive     CIPROFLOXACIN <=0.25 SENSITIVE Sensitive     GENTAMICIN <=1 SENSITIVE Sensitive     IMIPENEM 1 SENSITIVE Sensitive     PIP/TAZO <=4 SENSITIVE Sensitive     CEFEPIME 2 SENSITIVE Sensitive     * >=100,000 COLONIES/mL PSEUDOMONAS AERUGINOSA  MRSA PCR Screening     Status: None   Collection Time: 05/26/16 11:34 AM  Result Value Ref Range Status   MRSA by PCR NEGATIVE NEGATIVE Final    Comment:        The GeneXpert MRSA Assay (FDA approved for NASAL specimens only), is one component of a comprehensive MRSA colonization surveillance program. It is  not intended to diagnose MRSA infection nor to guide or monitor treatment for MRSA infections.       Radiology Studies: No results found.   Scheduled Meds: . acetaminophen  650 mg Oral BID  .  amiodarone  100 mg Oral Daily  . atorvastatin  80 mg Oral QHS  . carvedilol  25 mg Oral BID WC  . cloNIDine  0.1 mg Oral TID  . ferrous sulfate  325 mg Oral Q breakfast  . furosemide  80 mg Intravenous BID  . hydrALAZINE  75 mg Oral QID  . insulin aspart  0-9 Units Subcutaneous TID WC  . isosorbide mononitrate  120 mg Oral Daily  . levothyroxine  50 mcg Oral QAC breakfast  . multivitamin with minerals  1 tablet Oral q morning - 10a  . potassium chloride  40 mEq Oral BID WC  . sodium bicarbonate  650 mg Oral BID  . tamsulosin  0.4 mg Oral QPC supper  . warfarin  7.5 mg Oral ONCE-1800  . Warfarin - Pharmacist Dosing Inpatient   Does not apply q1800   Continuous Infusions: . heparin 1,200 Units/hr (05/29/16 2351)     LOS: 5 days    Time spent: 30 min    Janece Canterbury, MD [facility] Pager [phone]  If 7PM-7AM, please contact night-coverage [email or web address] Password [email or web address] 05/30/2016, 1:15 PM

## 2016-05-30 NOTE — Progress Notes (Signed)
CRITICAL VALUE ALERT  Critical value received:  Potassium 2.7  Date of notification:  05/30/2016   Time of notification:  [id number]  Critical value read back:Yes.    Nurse who received alert:  Casey Burkitt  MD notified (1st page):  On call Tamala Julian  Time of first page:  0500  MD notified (2nd page):  Time of second page:  Responding MD:  Tamala Julian  Time MD responded:  [phone]

## 2016-05-30 NOTE — Progress Notes (Signed)
ANTICOAGULATION CONSULT NOTE - Follow Up  Pharmacy Consult for Heparin and warfarin Indication: DVT  Allergies  Allergen Reactions  . Nsaids Nausea Only    GI issue  . Ibuprofen Other (See Comments)    GI Issues  . Ace Inhibitors Cough    Patient Measurements: Height: 5\' 10"  (177.8 cm) Weight: 175 lb 11.3 oz (79.7 kg) IBW/kg (Calculated) : 73 Heparin Dosing Weight: actual body weight Last recorded weight = 73.5 kg (05/07/16)  Vital Signs: Temp: 98.6 F (37 C) (11/19 0526) Temp Source: Oral (11/19 0526) BP: 156/62 (11/19 0526) Pulse Rate: 75 (11/19 0526)  Labs:  Recent Labs  05/28/16 0449 05/29/16 0421 05/30/16 0412  HGB 10.7* 9.4* 10.0*  HCT 33.8* 29.3* 31.3*  PLT 215 212 209  LABPROT 15.7* 17.1* 17.1*  INR 1.24 1.38 1.39  HEPARINUNFRC 0.63 0.56 0.49  CREATININE 2.13* 2.21* 2.21*    Estimated Creatinine Clearance: 28.4 mL/min (by C-G formula based on SCr of 2.21 mg/dL (H)).   Medical History: Past Medical History:  Diagnosis Date  . Angiomyolipoma 2009   On both kidneys noted in 2009  . Arthritis    neck and left wrist  . Atrial fibrillation ([place])   . CAD (coronary artery disease)    s/b CABG 1994, and subsequent stents. Repeat CABG 12/2011,  . Chronic diastolic heart failure ([place])   . CKD (chronic kidney disease)   . Clostridium difficile diarrhea 03/2016  . Depressive disorder   . Diabetes mellitus type 2 with peripheral artery disease (HCC)    DIET CONTROLLED  . DVT (deep venous thrombosis) ([place]) 2011   Right arm  . Gastroparesis   . GERD (gastroesophageal reflux disease)   . Gout   . History of hiatal hernia   . Hyperlipidemia   . Hypertension   . Internal hemorrhoids without mention of complication   . Ischemic colitis ([place])   . 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 Myelodysplastic syndrome ([place]) 05/22/2013   With low hemoglobin and platelets treated with Procrit  . Osteopenia   . Peptic ulcer    S/p partial gastrectomy in 1969  . Peripheral  artery disease ([place])   . Pneumonia 01/16/2016  . Pneumonia 03/2016  . Presence of permanent cardiac pacemaker   . Prostate cancer ([place]) 1997   XRT and lupron  . Renal artery stenosis ([place])   . Sick sinus syndrome ([place])   . Vitamin B 12 deficiency     Medications:  Scheduled:  . acetaminophen  650 mg Oral BID  . amiodarone  100 mg Oral Daily  . atorvastatin  80 mg Oral QHS  . carvedilol  25 mg Oral BID WC  . cloNIDine  0.1 mg Oral TID  . ferrous sulfate  325 mg Oral Q breakfast  . furosemide  80 mg Intravenous BID  . hydrALAZINE  75 mg Oral QID  . insulin aspart  0-9 Units Subcutaneous TID WC  . isosorbide mononitrate  120 mg Oral Daily  . levothyroxine  50 mcg Oral QAC breakfast  . multivitamin with minerals  1 tablet Oral q morning - 10a  . potassium chloride  40 mEq Oral BID WC  . sodium bicarbonate  650 mg Oral BID  . tamsulosin  0.4 mg Oral QPC supper  . Warfarin - Pharmacist Dosing Inpatient   Does not apply q1800   Infusions:  . heparin 1,200 Units/hr (05/29/16 2351)    Assessment:  80 yr male with ultrasound confirmed right arm DVT  PMH significant  for prostate cancer, PICC line currently in right arm for IV antibiotics, AFib, CAD, CKD, DM, H/O DVT (2011), Myelodysplastic syndrome, h/o GI bleed (08/2015)  Pharmacy consulted to start warfarin  05/30/2016  INR continues to be subtherapeutic and not responding really at all after warfarin doses of 4mg , 4mg , 6mg , and 6mg   Heparin level continues to be therapeutic on current rate of 1200 units/hr  H/H and Plts stable  Scr 2.21, CKD  DDI :amiodarone> may inhibit hepatic metabolism and increase the anticoagulation effect of warfarin.   No bleeding issues noted  Goal of Therapy:  INR 2-3 Heparin level 0.3-0.7 units/ml Monitor platelets by anticoagulation protocol: Yes   Plan:   Increase warfarin to 7.5mg  po x 1 at 6pm this evening  Daily INR  Continue IV heparin infusion @ 1200 units/hr  Follow daily  heparin level & CBC  Today is D5 of overlap of UFH/warfarin - patient will need at least 5 day overlap of heparin and warfarin and INR therapeutic x 24 hours before stopping heparin.   Adrian Saran, PharmD, BCPS Pager [phone] 05/30/2016 8:29 AM

## 2016-05-31 ENCOUNTER — Other Ambulatory Visit: Payer: Medicare Other

## 2016-05-31 ENCOUNTER — Ambulatory Visit: Payer: Medicare Other

## 2016-05-31 LAB — CBC
HCT: 26.7 % — ABNORMAL LOW (ref 39.0–52.0)
HEMATOCRIT: 26.1 % — AB (ref 39.0–52.0)
HEMOGLOBIN: 8.6 g/dL — AB (ref 13.0–17.0)
Hemoglobin: 8.4 g/dL — ABNORMAL LOW (ref 13.0–17.0)
MCH: 30.1 pg (ref 26.0–34.0)
MCH: 30.1 pg (ref 26.0–34.0)
MCHC: 32.2 g/dL (ref 30.0–36.0)
MCHC: 32.2 g/dL (ref 30.0–36.0)
MCV: 93.5 fL (ref 78.0–100.0)
MCV: 93.4 fL (ref 78.0–100.0)
PLATELETS: 190 10*3/uL (ref 150–400)
Platelets: 171 10*3/uL (ref 150–400)
RBC: 2.79 MIL/uL — ABNORMAL LOW (ref 4.22–5.81)
RBC: 2.86 MIL/uL — ABNORMAL LOW (ref 4.22–5.81)
RDW: 19.3 % — ABNORMAL HIGH (ref 11.5–15.5)
RDW: 19.3 % — AB (ref 11.5–15.5)
WBC: 7.4 10*3/uL (ref 4.0–10.5)
WBC: 5.7 10*3/uL (ref 4.0–10.5)

## 2016-05-31 LAB — BASIC METABOLIC PANEL
Anion gap: 6 (ref 5–15)
BUN: 55 mg/dL — AB (ref 6–20)
CO2: 21 mmol/L — ABNORMAL LOW (ref 22–32)
Calcium: 8 mg/dL — ABNORMAL LOW (ref 8.9–10.3)
Chloride: 107 mmol/L (ref 101–111)
Creatinine, Ser: 2.46 mg/dL — ABNORMAL HIGH (ref 0.61–1.24)
GFR calc Af Amer: 27 mL/min — ABNORMAL LOW (ref >60)
GFR, EST NON AFRICAN AMERICAN: 24 mL/min — AB (ref >60)
GLUCOSE: 109 mg/dL — AB (ref 65–99)
POTASSIUM: 3.8 mmol/L (ref 3.5–5.1)
Sodium: 134 mmol/L — ABNORMAL LOW (ref 135–145)

## 2016-05-31 LAB — HEPARIN LEVEL (UNFRACTIONATED): HEPARIN UNFRACTIONATED: 0.59 [IU]/mL (ref 0.30–0.70)

## 2016-05-31 LAB — GLUCOSE, CAPILLARY
GLUCOSE-CAPILLARY: 128 mg/dL — AB (ref 65–99)
GLUCOSE-CAPILLARY: 133 mg/dL — AB (ref 65–99)
GLUCOSE-CAPILLARY: 155 mg/dL — AB (ref 65–99)
GLUCOSE-CAPILLARY: 121 mg/dL — AB (ref 65–99)
GLUCOSE-CAPILLARY: 119 mg/dL — AB (ref 65–99)
Glucose-Capillary: 160 mg/dL — ABNORMAL HIGH (ref 65–99)

## 2016-05-31 LAB — PROTIME-INR
INR: 1.76
Prothrombin Time: 20.8 seconds — ABNORMAL HIGH (ref 11.4–15.2)

## 2016-05-31 MED ORDER — WARFARIN SODIUM 5 MG PO TABS
7.5000 mg | ORAL_TABLET | Freq: Once | ORAL | Status: AC
  Administered 2016-05-31: 7.5 mg via ORAL
  Filled 2016-05-31: qty 1

## 2016-05-31 MED ORDER — FOSFOMYCIN TROMETHAMINE 3 G PO PACK
3.0000 g | PACK | Freq: Once | ORAL | Status: AC
  Administered 2016-05-31: 3 g via ORAL
  Filled 2016-05-31: qty 3

## 2016-05-31 NOTE — Progress Notes (Signed)
ANTICOAGULATION CONSULT NOTE - Follow Up  Pharmacy Consult for Heparin and warfarin Indication: DVT  Allergies  Allergen Reactions  . Nsaids Nausea Only    GI issue  . Ibuprofen Other (See Comments)    GI Issues  . Ace Inhibitors Cough    Patient Measurements: Height: 5\' 10"  (177.8 cm) Weight: 179 lb 7.3 oz (81.4 kg) IBW/kg (Calculated) : 73 Heparin Dosing Weight: actual body weight Last recorded weight = 73.5 kg (05/07/16)  Vital Signs: Temp: 99.1 F (37.3 C) (11/20 0500) Temp Source: Oral (11/20 0500) BP: 144/72 (11/20 0500) Pulse Rate: 63 (11/20 0500)  Labs:  Recent Labs  05/29/16 0421 05/30/16 0412 05/31/16 0359  HGB 9.4* 10.0* 8.4*  HCT 29.3* 31.3* 26.1*  PLT 212 209 190  LABPROT 17.1* 17.1* 20.8*  INR 1.38 1.39 1.76  HEPARINUNFRC 0.56 0.49 0.59  CREATININE 2.21* 2.21* 2.46*    Estimated Creatinine Clearance: 25.6 mL/min (by C-G formula based on SCr of 2.46 mg/dL (H)).   Medical History: Past Medical History:  Diagnosis Date  . Angiomyolipoma 2009   On both kidneys noted in 2009  . Arthritis    neck and left wrist  . Atrial fibrillation ([place])   . CAD (coronary artery disease)    s/b CABG 1994, and subsequent stents. Repeat CABG 12/2011,  . Chronic diastolic heart failure ([place])   . CKD (chronic kidney disease)   . Clostridium difficile diarrhea 03/2016  . Depressive disorder   . Diabetes mellitus type 2 with peripheral artery disease (HCC)    DIET CONTROLLED  . DVT (deep venous thrombosis) ([place]) 2011   Right arm  . Gastroparesis   . GERD (gastroesophageal reflux disease)   . Gout   . History of hiatal hernia   . Hyperlipidemia   . Hypertension   . Internal hemorrhoids without mention of complication   . Ischemic colitis ([place])   . 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 Myelodysplastic syndrome ([place]) 05/22/2013   With low hemoglobin and platelets treated with Procrit  . Osteopenia   . Peptic ulcer    S/p partial gastrectomy in 1969  . Peripheral  artery disease ([place])   . Pneumonia 01/16/2016  . Pneumonia 03/2016  . Presence of permanent cardiac pacemaker   . Prostate cancer ([place]) 1997   XRT and lupron  . Renal artery stenosis ([place])   . Sick sinus syndrome ([place])   . Vitamin B 12 deficiency     Medications:  Scheduled:  . acetaminophen  650 mg Oral BID  . amiodarone  100 mg Oral Daily  . atorvastatin  80 mg Oral QHS  . carvedilol  25 mg Oral BID WC  . cloNIDine  0.1 mg Oral TID  . ferrous sulfate  325 mg Oral Q breakfast  . furosemide  80 mg Intravenous BID  . hydrALAZINE  75 mg Oral QID  . insulin aspart  0-9 Units Subcutaneous TID WC  . isosorbide mononitrate  120 mg Oral Daily  . levothyroxine  50 mcg Oral QAC breakfast  . multivitamin with minerals  1 tablet Oral q morning - 10a  . potassium chloride  40 mEq Oral BID WC  . sodium bicarbonate  650 mg Oral BID  . tamsulosin  0.4 mg Oral QPC supper  . vancomycin  125 mg Oral QID  . Warfarin - Pharmacist Dosing Inpatient   Does not apply q1800   Infusions:  . heparin 1,200 Units/hr (05/30/16 2040)    Assessment:  80 yr male with  ultrasound confirmed right arm DVT  PMH significant for prostate cancer, PICC line currently in right arm for IV antibiotics, AFib, CAD, CKD, DM, H/O DVT (2011), Myelodysplastic syndrome, h/o GI bleed (08/2015)  Pharmacy consulted to start warfarin  05/31/2016  INR continues to be subtherapeutic   Heparin level continues to be therapeutic on current rate of 1200 units/hr  H/H and Plts stable  Scr 2.46, CKD  DDI :amiodarone> may inhibit hepatic metabolism and increase the anticoagulation effect of warfarin.   No bleeding issues per RN  Goal of Therapy:  INR 2-3 Heparin level 0.3-0.7 units/ml Monitor platelets by anticoagulation protocol: Yes   Plan:   warfarin  7.5mg  po x 1 at 6pm this evening  Daily INR  Continue IV heparin infusion @ 1200 units/hr  Follow daily heparin level & CBC  Today is D6 of overlap of  UFH/warfarin - patient will need at least 5 day overlap of heparin and warfarin and INR therapeutic x 24 hours before stopping heparin.   Dolly Rias RPh 05/31/2016, 9:46 AM Pager [phone]

## 2016-05-31 NOTE — Progress Notes (Signed)
PROGRESS NOTE  [name]  [id number] DOB: 02-14-1936 DOA: 05/25/2016 PCP: [name] Pac, MD  Brief Narrative:   [name] Mudd. is a 80 y.o. male with multiple medical problems detailed below who was recently discharged approximately 6 weeks ago after being treated for endocarditis involving the tricuspid valve requiring weeks weeks of IV antibiotic therapy with Ancef. The patient received the last dose of IV antibiotic on 05/25/16 and was in the process of having the PICC line removed when the family noticed that he had swelling in the right upper extremity and some tenderness in the area. The home health nurse evaluated the right upper extremity and obtained an outpatient ultrasound of the right upper extremity that was positive for DVT. The patient was sent to the emergency department. The Doppler ultrasound was not repeated however the ER doctor discussed the case with multiple specialists including cardiology and hematology and the decision was made for 6 weeks of anticoagulation.  The patient has a complicated history with bleeding. 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  He also has myelodysplastic syndrome and has biweekly Epogen injections by hematology.  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 He has paroxysmal atrial fibrillation but has not been anticoagulated because of his history of bleeding.  Admitting hospitalist had a long discussion with the patient and his wife and they decided to proceed with anticoagulation.  Given his chronic kidney disease hematology recommended heparin bridge to warfarin for anticoagulation for 4-6 weeks duration of therapy.    Assessment & Plan:   Principal Problem:   Acute deep vein thrombosis (DVT) of right upper extremity (HCC) Active Problems:   PAF (paroxysmal atrial fibrillation) (HCC)   PAD (peripheral artery disease) (HCC)   Chronic diastolic heart  failure (HCC)   Diabetes mellitus (HCC)   Essential hypertension   NSVT (nonsustained ventricular tachycardia) (HCC)   Anemia of chronic disease   Pacemaker   Acute renal failure superimposed on stage 4 chronic kidney disease (HCC)   Pressure injury of skin  Acute DVT RUE related to right upper extremity PICC line, INR 1.39 -  Continue heparin -  Warfarin per pharmacy -  Bleeding from PIV today (PIV bleeding replaced) -  Followed by Cardiology previously for INR checks   Leukocytosis.  No respiratory symptoms.  Having some urinary urgency since starting lasix, and now has some bladder tenderness.  C. Diff positive, but diarrhea stopped quickly after cessation of laxatives.  Case discussed with Dr. Tommy [name] from Infectious disease.   -  Continue oral vanc -  Fosfomycin 3gm x 1  CKD stage 3/4, creatinine trended down to 2.2  Essential hypertension, blood pressures elevated but better -  Continue carvedilol, hydralazine, imdur -  Continue clonidine to tid  Recent endocarditis of the tricuspid valve s/p 6 weeks of ancef per Dr. Johnnye [name], ID  MDS with chronic anemia followed by Dr. Alvy [name], hemoglobin stable at currently at 10g/dl -  Close outpatient hgb monitoring -  If hemoglobin falls below 10g/dl, will plan to give epo injection  Normocytic anemia, hgb trending down but no obvious bleeding other than trace blood from around PIV -  TSH 33.4 -  Increase levothyroxine to 3mcg -  Repeat TSH in 4 weeks -  Repeat hgb in afternoon  Hx of PAF, managed with beta blocker, stable, now on anticoagulation  Acute on chronic diastolic heart failure due to IVF (heparin), edema, shortness  of breath, and hyponatremia improving -  Continue IV lasix 80mg  BID -  Wt trending down -  Will not increase lasix due to recent diarrhea  Diabetes mellitus type 2, stable, continue SSI  Pseudomonas UTI, present at time of admission, NOT catheter associated -  tx with fosfomycin 3gm x 1  Stage 2  pressure ulcer on sacrum -  allevyn -  PT rec HH PT  Atypical chest pain.  Follow up with cardiology routine outpatient   Hypokalemia, resolved with additional oral potassium  DVT prophylaxis:  Heparin gtt Code Status:  DNR Family Communication:  Patient alone Disposition Plan:  Pending therapeutic INR  Consultants:   none  Procedures:  none  Antimicrobials:   none    Subjective: Right arm about the same as yesterday. Denies shortness of breath, nausea, constipation.  Denies blood in stools or urine.  Bleeding from about PICC.  Diarrhea stopped yesterday.    Objective: Vitals:   05/30/16 0526 05/30/16 1415 05/30/16 2100 05/31/16 0500  BP: (!) 156/62 (!) 147/62 (!) 109/53 (!) 144/72  Pulse: 75 71 (!) 56 63  Resp: 16 16 16 18   Temp: 98.6 F (37 C) 98.4 F (36.9 C) 98.9 F (37.2 C) 99.1 F (37.3 C)  TempSrc: Oral Oral Oral Oral  SpO2: 99% 99% 100% 100%  Weight: 79.7 kg (175 lb 11.3 oz)   81.4 kg (179 lb 7.3 oz)  Height:        Intake/Output Summary (Last 24 hours) at 05/31/16 1258 Last data filed at 05/31/16 0500  Gross per 24 hour  Intake              360 ml  Output              100 ml  Net              260 ml   Filed Weights   05/29/16 0500 05/30/16 0526 05/31/16 0500  Weight: 83.1 kg (183 lb 3.2 oz) 79.7 kg (175 lb 11.3 oz) 81.4 kg (179 lb 7.3 oz)    Examination:  General exam:  Adult male.  No acute distress.   HEENT:  NCAT, MMM Respiratory system: Clear to auscultation bilaterally Cardiovascular system: IRRR, normal S1/S2. No murmurs, rubs, gallops or clicks.  Warm extremities Gastrointestinal system:  Hyperactive bowel sounds, soft, nondistended, suprapubic tenderness, no guarding or rebound MSK:  Normal tone and bulk, 2+ pitting edema of bilateral lower extremities.  Right arm swelling has mostly resolved except for a 2-3cm area on medial arm just distal to antecubital fossa   Neuro:  Grossly intact    Data Reviewed: I have personally reviewed  following labs and imaging studies  CBC:  Recent Labs Lab 05/25/16 1512  05/27/16 0503 05/28/16 0449 05/29/16 0421 05/30/16 0412 05/31/16 0359  WBC 9.0  < > 5.9 6.7 7.0 11.8* 7.4  NEUTROABS 6.8  --   --   --   --   --   --   HGB 10.2*  < > 10.5* 10.7* 9.4* 10.0* 8.4*  HCT 32.5*  < > 32.7* 33.8* 29.3* 31.3* 26.1*  MCV 93.4  < > 92.9 93.6 93.3 92.6 93.5  PLT 133*  < > 158 215 212 209 190  < > = values in this interval not displayed. Basic Metabolic Panel:  Recent Labs Lab 05/27/16 0503 05/28/16 0449 05/29/16 0421 05/30/16 0412 05/31/16 0359  NA 133* 136 137 136 134*  K 3.7 3.4* 3.1* 2.7* 3.8  CL 104  107 107 107 107  CO2 20* 23 23 21* 21*  GLUCOSE 106* 104* 117* 110* 109*  BUN 47* 48* 49* 51* 55*  CREATININE 2.08* 2.13* 2.21* 2.21* 2.46*  CALCIUM 8.1* 8.5* 8.3* 8.2* 8.0*   GFR: Estimated Creatinine Clearance: 25.6 mL/min (by C-G formula based on SCr of 2.46 mg/dL (H)). Liver Function Tests: No results for input(s): AST, ALT, ALKPHOS, BILITOT, PROT, ALBUMIN in the last 168 hours. No results for input(s): LIPASE, AMYLASE in the last 168 hours. No results for input(s): AMMONIA in the last 168 hours. Coagulation Profile:  Recent Labs Lab 05/27/16 0503 05/28/16 0449 05/29/16 0421 05/30/16 0412 05/31/16 0359  INR 1.30 1.24 1.38 1.39 1.76   Cardiac Enzymes: No results for input(s): CKTOTAL, CKMB, CKMBINDEX, TROPONINI in the last 168 hours. BNP (last 3 results) No results for input(s): PROBNP in the last 8760 hours. HbA1C: No results for input(s): HGBA1C in the last 72 hours. CBG:  Recent Labs Lab 05/29/16 2209 05/30/16 0737 05/30/16 1234 05/31/16 1027 05/31/16 1222  GLUCAP 141* 118* 183* 155* 160*   Lipid Profile: No results for input(s): CHOL, HDL, LDLCALC, TRIG, CHOLHDL, LDLDIRECT in the last 72 hours. Thyroid Function Tests:  Recent Labs  05/29/16 0421  TSH 33.438*  FREET4 0.75   Anemia Panel: No results for input(s): VITAMINB12, FOLATE,  FERRITIN, TIBC, IRON, RETICCTPCT in the last 72 hours. Urine analysis:    Component Value Date/Time   COLORURINE YELLOW 05/30/2016 1536   APPEARANCEUR CLOUDY (A) 05/30/2016 1536   LABSPEC 1.014 05/30/2016 1536   LABSPEC 1.010 05/19/2015 1231   PHURINE 6.0 05/30/2016 1536   GLUCOSEU NEGATIVE 05/30/2016 1536   GLUCOSEU Negative 05/19/2015 1231   HGBUR LARGE (A) 05/30/2016 1536   BILIRUBINUR NEGATIVE 05/30/2016 1536   BILIRUBINUR Negative 05/19/2015 1231   KETONESUR NEGATIVE 05/30/2016 1536   PROTEINUR 100 (A) 05/30/2016 1536   UROBILINOGEN 0.2 05/19/2015 1231   NITRITE NEGATIVE 05/30/2016 1536   LEUKOCYTESUR LARGE (A) 05/30/2016 1536   LEUKOCYTESUR Negative 05/19/2015 1231   Sepsis Labs: @LABRCNTIP (procalcitonin:4,lacticidven:4)  ) Recent Results (from the past 240 hour(s))  Culture, Urine     Status: Abnormal   Collection Time: 05/25/16 11:48 PM  Result Value Ref Range Status   Specimen Description URINE, CLEAN CATCH  Final   Special Requests NONE  Final   Culture >=100,000 COLONIES/mL PSEUDOMONAS AERUGINOSA (A)  Final   Report Status 05/28/2016 FINAL  Final   Organism ID, Bacteria PSEUDOMONAS AERUGINOSA (A)  Final      Susceptibility   Pseudomonas aeruginosa - MIC*    CEFTAZIDIME 4 SENSITIVE Sensitive     CIPROFLOXACIN <=0.25 SENSITIVE Sensitive     GENTAMICIN <=1 SENSITIVE Sensitive     IMIPENEM 1 SENSITIVE Sensitive     PIP/TAZO <=4 SENSITIVE Sensitive     CEFEPIME 2 SENSITIVE Sensitive     * >=100,000 COLONIES/mL PSEUDOMONAS AERUGINOSA  MRSA PCR Screening     Status: None   Collection Time: 05/26/16 11:34 AM  Result Value Ref Range Status   MRSA by PCR NEGATIVE NEGATIVE Final    Comment:        The GeneXpert MRSA Assay (FDA approved for NASAL specimens only), is one component of a comprehensive MRSA colonization surveillance program. It is not intended to diagnose MRSA infection nor to guide or monitor treatment for MRSA infections.   C difficile quick  scan w PCR reflex     Status: Abnormal   Collection Time: 05/30/16  3:48 PM  Result Value Ref  Range Status   C Diff antigen POSITIVE (A) NEGATIVE Final   C Diff toxin POSITIVE (A) NEGATIVE Final   C Diff interpretation Toxin producing C. difficile detected.  Final    Comment: CRITICAL RESULT CALLED TO, READ BACK BY AND VERIFIED WITH: MATHIS,M RN [phone] [place]       Radiology Studies: No results found.   Scheduled Meds: . acetaminophen  650 mg Oral BID  . amiodarone  100 mg Oral Daily  . atorvastatin  80 mg Oral QHS  . carvedilol  25 mg Oral BID WC  . cloNIDine  0.1 mg Oral TID  . ferrous sulfate  325 mg Oral Q breakfast  . furosemide  80 mg Intravenous BID  . hydrALAZINE  75 mg Oral QID  . insulin aspart  0-9 Units Subcutaneous TID WC  . isosorbide mononitrate  120 mg Oral Daily  . levothyroxine  50 mcg Oral QAC breakfast  . multivitamin with minerals  1 tablet Oral q morning - 10a  . potassium chloride  40 mEq Oral BID WC  . sodium bicarbonate  650 mg Oral BID  . tamsulosin  0.4 mg Oral QPC supper  . vancomycin  125 mg Oral QID  . warfarin  7.5 mg Oral ONCE-1800  . Warfarin - Pharmacist Dosing Inpatient   Does not apply q1800   Continuous Infusions: . heparin 1,200 Units/hr (05/30/16 2040)     LOS: 6 days    Time spent: 30 min    Janece Canterbury, MD [facility] Pager [phone]  If 7PM-7AM, please contact night-coverage [email or web address] Password [facility] 05/31/2016, 12:58 PM

## 2016-06-01 LAB — BASIC METABOLIC PANEL
Anion gap: 5 (ref 5–15)
BUN: 54 mg/dL — ABNORMAL HIGH (ref 6–20)
CO2: 22 mmol/L (ref 22–32)
Calcium: 8 mg/dL — ABNORMAL LOW (ref 8.9–10.3)
Chloride: 107 mmol/L (ref 101–111)
Creatinine, Ser: 2.45 mg/dL — ABNORMAL HIGH (ref 0.61–1.24)
GFR calc Af Amer: 27 mL/min — ABNORMAL LOW (ref >60)
GFR calc non Af Amer: 24 mL/min — ABNORMAL LOW (ref >60)
Glucose, Bld: 94 mg/dL (ref 65–99)
Potassium: 3.8 mmol/L (ref 3.5–5.1)
Sodium: 134 mmol/L — ABNORMAL LOW (ref 135–145)

## 2016-06-01 LAB — GLUCOSE, CAPILLARY
Glucose-Capillary: 87 mg/dL (ref 65–99)
Glucose-Capillary: 188 mg/dL — ABNORMAL HIGH (ref 65–99)
Glucose-Capillary: 155 mg/dL — ABNORMAL HIGH (ref 65–99)
Glucose-Capillary: 132 mg/dL — ABNORMAL HIGH (ref 65–99)

## 2016-06-01 LAB — CBC
HCT: 27.3 % — ABNORMAL LOW (ref 39.0–52.0)
Hemoglobin: 8.8 g/dL — ABNORMAL LOW (ref 13.0–17.0)
MCH: 30.1 pg (ref 26.0–34.0)
MCHC: 32.2 g/dL (ref 30.0–36.0)
MCV: 93.5 fL (ref 78.0–100.0)
Platelets: 183 10*3/uL (ref 150–400)
RBC: 2.92 MIL/uL — ABNORMAL LOW (ref 4.22–5.81)
RDW: 19.5 % — ABNORMAL HIGH (ref 11.5–15.5)
WBC: 5.9 10*3/uL (ref 4.0–10.5)

## 2016-06-01 LAB — PROTIME-INR
INR: 1.81
Prothrombin Time: 21.2 seconds — ABNORMAL HIGH (ref 11.4–15.2)

## 2016-06-01 LAB — HEPARIN LEVEL (UNFRACTIONATED): HEPARIN UNFRACTIONATED: 0.46 [IU]/mL (ref 0.30–0.70)

## 2016-06-01 MED ORDER — WARFARIN SODIUM 6 MG PO TABS
6.0000 mg | ORAL_TABLET | Freq: Once | ORAL | Status: AC
  Administered 2016-06-01: 6 mg via ORAL
  Filled 2016-06-01: qty 1

## 2016-06-01 MED ORDER — DARBEPOETIN ALFA 40 MCG/0.4ML IJ SOSY
40.0000 ug | PREFILLED_SYRINGE | Freq: Once | INTRAMUSCULAR | Status: AC
  Administered 2016-06-01: 40 ug via SUBCUTANEOUS
  Filled 2016-06-01: qty 0.4

## 2016-06-01 NOTE — Progress Notes (Signed)
Brief pharmacy note:  Pharmacy consulted to dose darbepoetin for CKD and MDS  Hgb 8.8  Plan: -darbepoetin 0.41mcg/kg x1 (85mcg) SQ x 1 -May repeat in 4 weeks - pharmacy will sign off, please re-consult if needed  Dolly Rias RPh 06/01/2016, 2:01 PM Pager [phone]

## 2016-06-01 NOTE — Progress Notes (Signed)
ANTICOAGULATION CONSULT NOTE - Follow Up  Pharmacy Consult for Heparin and warfarin Indication: DVT  Allergies  Allergen Reactions  . Nsaids Nausea Only    GI issue  . Ibuprofen Other (See Comments)    GI Issues  . Ace Inhibitors Cough    Patient Measurements: Height: 5\' 10"  (177.8 cm) Weight: 174 lb 6.1 oz (79.1 kg) IBW/kg (Calculated) : 73 Heparin Dosing Weight: actual body weight Last recorded weight = 73.5 kg (05/07/16)  Vital Signs: Temp: 98.9 F (37.2 C) (11/21 0545) Temp Source: Oral (11/21 0545) BP: 155/71 (11/21 0545) Pulse Rate: 58 (11/21 0545)  Labs:  Recent Labs  05/30/16 0412 05/31/16 0359 05/31/16 1406 06/01/16 0407  HGB 10.0* 8.4* 8.6* 8.8*  HCT 31.3* 26.1* 26.7* 27.3*  PLT 209 190 171 183  LABPROT 17.1* 20.8*  --  21.2*  INR 1.39 1.76  --  1.81  HEPARINUNFRC 0.49 0.59  --  0.46  CREATININE 2.21* 2.46*  --  2.45*    Estimated Creatinine Clearance: 25.7 mL/min (by C-G formula based on SCr of 2.45 mg/dL (H)).   Medical History: Past Medical History:  Diagnosis Date  . Angiomyolipoma 2009   On both kidneys noted in 2009  . Arthritis    neck and left wrist  . Atrial fibrillation ([place])   . CAD (coronary artery disease)    s/b CABG 1994, and subsequent stents. Repeat CABG 12/2011,  . Chronic diastolic heart failure ([place])   . CKD (chronic kidney disease)   . Clostridium difficile diarrhea 03/2016  . Depressive disorder   . Diabetes mellitus type 2 with peripheral artery disease (HCC)    DIET CONTROLLED  . DVT (deep venous thrombosis) ([place]) 2011   Right arm  . Gastroparesis   . GERD (gastroesophageal reflux disease)   . Gout   . History of hiatal hernia   . Hyperlipidemia   . Hypertension   . Internal hemorrhoids without mention of complication   . Ischemic colitis ([place])   . 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 Myelodysplastic syndrome ([place]) 05/22/2013   With low hemoglobin and platelets treated with Procrit  . Osteopenia   . Peptic ulcer    S/p partial gastrectomy in 1969  . Peripheral artery disease ([place])   . Pneumonia 01/16/2016  . Pneumonia 03/2016  . Presence of permanent cardiac pacemaker   . Prostate cancer ([place]) 1997   XRT and lupron  . Renal artery stenosis ([place])   . Sick sinus syndrome ([place])   . Vitamin B 12 deficiency     Medications:  Scheduled:  . acetaminophen  650 mg Oral BID  . amiodarone  100 mg Oral Daily  . atorvastatin  80 mg Oral QHS  . carvedilol  25 mg Oral BID WC  . cloNIDine  0.1 mg Oral TID  . ferrous sulfate  325 mg Oral Q breakfast  . furosemide  80 mg Intravenous BID  . hydrALAZINE  75 mg Oral QID  . insulin aspart  0-9 Units Subcutaneous TID WC  . isosorbide mononitrate  120 mg Oral Daily  . levothyroxine  50 mcg Oral QAC breakfast  . multivitamin with minerals  1 tablet Oral q morning - 10a  . potassium chloride  40 mEq Oral BID WC  . sodium bicarbonate  650 mg Oral BID  . tamsulosin  0.4 mg Oral QPC supper  . vancomycin  125 mg Oral QID  . Warfarin - Pharmacist Dosing Inpatient   Does not apply q1800   Infusions:  .  heparin 1,200 Units/hr (05/31/16 1924)    Assessment:  80 yr male with ultrasound confirmed right arm DVT  PMH significant for prostate cancer, PICC line currently in right arm for IV antibiotics, AFib, CAD, CKD, DM, H/O DVT (2011), Myelodysplastic syndrome, h/o GI bleed (08/2015)  Pharmacy consulted to start warfarin  06/01/2016  INR continues to be subtherapeutic after doses of 4mg , 4mg , 6mg ,6mg , 7.5mg ,7.5mg   Heparin level continues to be therapeutic on current rate of 1200 units/hr  H/H and Plts stable  Scr 2.45, CKD  DDI :amiodarone> may inhibit hepatic metabolism and increase the anticoagulation effect of warfarin.   No bleeding issues per RN  Pt previously on warfarin in 2013 with doses of 5mg  daily except 2.5mg  on saturdays (was on same dose of amiodarone as currently)  Goal of Therapy:  INR 2-3 Heparin level 0.3-0.7 units/ml Monitor platelets  by anticoagulation protocol: Yes   Plan:   warfarin  6mg  po x 1 at 6pm this evening (though subtherapeutic do NOT want to overshoot with higher doses due to h/o of recent GI bleed)  Daily INR  Continue IV heparin infusion @ 1200 units/hr  Follow daily heparin level & CBC  Today is D7 of overlap of UFH/warfarin - patient will need at least 5 day overlap of heparin and warfarin and INR therapeutic x 24 hours before stopping heparin.   Dolly Rias RPh 06/01/2016, 8:23 AM Pager [phone]

## 2016-06-01 NOTE — Progress Notes (Signed)
Physical Therapy Treatment Patient Details Name: [name] TERHUNE Sr. MRN: [id number] DOB: [birth year] Today's Date: 06/01/2016    History of Present Illness 80 yo male admitted with acute DVT R UE. Hx of DVT, CKD, P Afib, HF, MDS, osteopenia, HTN, hemorrhagic cystitis, anemia. + for C Diff    PT Comments    The patient is very weak today compared to 2 days ago. He required 2 assist to stand up and only pivot to recliner. Patient may need SNF if he continues to be weak.   Follow Up Recommendations  SNF;Home health PT;Supervision/Assistance - 24 hour     Equipment Recommendations  None recommended by PT    Recommendations for Other Services       Precautions / Restrictions Precautions Precautions: Fall Precaution Comments: having  some loose BM's, much weaker    Mobility  Bed Mobility Overal bed mobility: Needs Assistance Bed Mobility: Supine to Sit     Supine to sit: HOB elevated;Min guard     General bed mobility comments: patient is much weaker this visit vs 2 days ago. Required more assistance  Transfers Overall transfer level: Needs assistance Equipment used: Rolling walker (2 wheeled) Transfers: Sit to/from [facility] Sit to Stand: Mod assist;+2 physical assistance;+2 safety/equipment;From elevated surface Stand pivot transfers: Mod assist;+2 physical assistance;+2 safety/equipment       General transfer comment: Assist to rise, stabilize, control descent.     Assist of 2 to power up to standing.  Ambulation/Gait                 Stairs            Wheelchair Mobility    Modified Rankin (Stroke Patients Only)       Balance                                    Cognition Arousal/Alertness: Awake/alert Behavior During Therapy: Flat affect                        Exercises      General Comments        Pertinent Vitals/Pain Pain Assessment: No/denies pain    Home Living                       Prior Function            PT Goals (current goals can now be found in the care plan section) Progress towards PT goals: Progressing toward goals    Frequency    Min 3X/week      PT Plan Discharge plan needs to be updated    Co-evaluation             End of Session Equipment Utilized During Treatment: Gait belt Activity Tolerance: Patient limited by fatigue Patient left: in chair;with call bell/phone within reach;with chair alarm set     Time: [id number] PT Time Calculation (min) (ACUTE ONLY): 18 min  Charges:  $Therapeutic Activity: 8-22 mins                    G Codes:      Claretha Cooper 06/01/2016, 4:55 PM

## 2016-06-01 NOTE — Progress Notes (Addendum)
PROGRESS NOTE  [name]  [id number] DOB: 18-Jun-1936 DOA: 05/25/2016 PCP: [name], MD  Brief Narrative:   [name]. is a 80 y.o. male with history of chronic diastolic heart failure, DM, paroxysmal a-fib NOT on a/c due to bleeding complications and anemia, CKD stage 4, myelodysplastic syndrome, and recent TV endocarditis.  He was discharged approximately 6 weeks prior to admission after being treated for endocarditis involving the tricuspid valve requiring 6 weeks of IV Ancef.  He received the last dose of IV antibiotic on 05/25/16 and was in the process of having the PICC line removed when the family noticed that he had swelling in the right upper extremity.  Duplex was positive for DVT.  The patient has a complicated history with bleeding, including hemorrhagic cystitis, and he receives biweekly Epogen injections from hematology for his MDS and CKD.  The case was discussed with cardiology and hematology and the decision was made for 6 weeks of anticoagulation starting on 11/14, ending December 25th.   Admitting hospitalist had a long discussion with the patient and his wife and they decided to proceed with anticoagulation.  Given his chronic kidney disease hematology and pharmacy recommended heparin bridge to warfarin.  His hospitalization has been complicated by pseudomonas UTI and C. Diff diarrhea as well as acute on chronic diastolic heart failure.    Assessment & Plan:   Principal Problem:   Acute deep vein thrombosis (DVT) of right upper extremity (HCC) Active Problems:   PAF (paroxysmal atrial fibrillation) (HCC)   PAD (peripheral artery disease) (HCC)   Chronic diastolic heart failure (HCC)   Diabetes mellitus (HCC)   Essential hypertension   NSVT (nonsustained ventricular tachycardia) (HCC)   Anemia of chronic disease   Pacemaker   Acute renal failure superimposed on stage 4 chronic kidney disease (HCC)   Pressure injury of skin  Acute DVT RUE  related to right upper extremity PICC line, INR rising -  Continue heparin -  Warfarin per pharmacy -  Bleeding from PIV resolved  -  Followed by Cardiology previously for INR checks   Acute on chronic diastolic heart failure due to IVF (heparin).  Edema, shortness of breath, and hyponatremia improving -  Continue IV lasix 80mg  BID -  Wt trending down -  Will not increase lasix due to recent diarrhea  C. Diff positive, but diarrhea stopped quickly after cessation of laxatives.  Case discussed with Dr. Tommy Medal from Infectious disease.   -  Continue oral vanc for 2 weeks  Pseudomonas cystitis in setting of C. Diff diarrhea, present at the time of admission, NOT catheter associated.  Case discussed with Dr. Tommy Medal from Infectious disease.   -  Fosfomycin 3gm x 1  CKD stage 4, baseline creatinine 2.2-2.4  Essential hypertension, blood pressures elevated but better -  Continue carvedilol, hydralazine, imdur -  Continue clonidine tid (new this admission)  Recent endocarditis of the tricuspid valve s/p 6 weeks of ancef per Dr. Johnnye Sima, ID  MDS with chronic anemia followed by Dr. Alvy Bimler, hemoglobin down to 8.8g/dl today -  Epogen injection today  Hypothyroidism -  TSH 33.4 -  Increased levothyroxine to 21mcg -  Repeat TSH in 4 weeks  Hx of PAF, managed with beta blocker, stable, now on anticoagulation  Diabetes mellitus type 2, stable, continue SSI  Stage 2 pressure ulcer on sacrum -  allevyn -  PT rec HH PT  Atypical chest pain.  Follow up with cardiology routine  outpatient   Hypokalemia, resolved with additional oral potassium  DVT prophylaxis:  Heparin gtt Code Status:  DNR Family Communication:  Patient alone Disposition Plan:  Pending therapeutic INR  Consultants:   none  Procedures:  none  Antimicrobials:   none    Subjective: Right arm about the same as yesterday.  Feeling much better.  Stools are more formed.  Denies bladder tenderness.  Bleeding from  PIV has stopped.   Objective: Vitals:   05/31/16 0500 05/31/16 1716 05/31/16 2057 06/01/16 0545  BP: (!) 144/72 (!) 146/64 121/60 (!) 155/71  Pulse: 63 63 (!) 57 (!) 58  Resp: 18 18 18 18   Temp: 99.1 F (37.3 C) 98.2 F (36.8 C) 97.9 F (36.6 C) 98.9 F (37.2 C)  TempSrc: Oral Oral Oral Oral  SpO2: 100% 100% 100% 100%  Weight: 81.4 kg (179 lb 7.3 oz)   79.1 kg (174 lb 6.1 oz)  Height:        Intake/Output Summary (Last 24 hours) at 06/01/16 1321 Last data filed at 06/01/16 0851  Gross per 24 hour  Intake              520 ml  Output             1500 ml  Net             -980 ml   Filed Weights   05/30/16 0526 05/31/16 0500 06/01/16 0545  Weight: 79.7 kg (175 lb 11.3 oz) 81.4 kg (179 lb 7.3 oz) 79.1 kg (174 lb 6.1 oz)    Examination:  General exam:  Adult male.  No acute distress.   HEENT:  NCAT, MMM Respiratory system: Clear to auscultation bilaterally Cardiovascular system: IRRR, normal S1/S2. No murmurs, rubs, gallops or clicks.  Warm extremities Gastrointestinal system:  Hyperactive bowel sounds, soft, nondistended, nontender, no guarding or rebound MSK:  Normal tone and bulk, 2+ pitting edema of bilateral lower extremities.  Right arm swelling has mostly resolved except for a 3cm area on medial arm just distal to antecubital fossa   Neuro:  Grossly intact    Data Reviewed: I have personally reviewed following labs and imaging studies  CBC:  Recent Labs Lab 05/25/16 1512  05/29/16 0421 05/30/16 0412 05/31/16 0359 05/31/16 1406 06/01/16 0407  WBC 9.0  < > 7.0 11.8* 7.4 5.7 5.9  NEUTROABS 6.8  --   --   --   --   --   --   HGB 10.2*  < > 9.4* 10.0* 8.4* 8.6* 8.8*  HCT 32.5*  < > 29.3* 31.3* 26.1* 26.7* 27.3*  MCV 93.4  < > 93.3 92.6 93.5 93.4 93.5  PLT 133*  < > 212 209 190 171 183  < > = values in this interval not displayed. Basic Metabolic Panel:  Recent Labs Lab 05/28/16 0449 05/29/16 0421 05/30/16 0412 05/31/16 0359 06/01/16 0407  NA 136 137  136 134* 134*  K 3.4* 3.1* 2.7* 3.8 3.8  CL 107 107 107 107 107  CO2 23 23 21* 21* 22  GLUCOSE 104* 117* 110* 109* 94  BUN 48* 49* 51* 55* 54*  CREATININE 2.13* 2.21* 2.21* 2.46* 2.45*  CALCIUM 8.5* 8.3* 8.2* 8.0* 8.0*   GFR: Estimated Creatinine Clearance: 25.7 mL/min (by C-G formula based on SCr of 2.45 mg/dL (H)). Liver Function Tests: No results for input(s): AST, ALT, ALKPHOS, BILITOT, PROT, ALBUMIN in the last 168 hours. No results for input(s): LIPASE, AMYLASE in the last 168 hours. No  results for input(s): AMMONIA in the last 168 hours. Coagulation Profile:  Recent Labs Lab 05/28/16 0449 05/29/16 0421 05/30/16 0412 05/31/16 0359 06/01/16 0407  INR 1.24 1.38 1.39 1.76 1.81   Cardiac Enzymes: No results for input(s): CKTOTAL, CKMB, CKMBINDEX, TROPONINI in the last 168 hours. BNP (last 3 results) No results for input(s): PROBNP in the last 8760 hours. HbA1C: No results for input(s): HGBA1C in the last 72 hours. CBG:  Recent Labs Lab 05/31/16 1222 05/31/16 1614 05/31/16 2212 06/01/16 0749 06/01/16 1201  GLUCAP 160* 121* 119* 87 188*   Lipid Profile: No results for input(s): CHOL, HDL, LDLCALC, TRIG, CHOLHDL, LDLDIRECT in the last 72 hours. Thyroid Function Tests: No results for input(s): TSH, T4TOTAL, FREET4, T3FREE, THYROIDAB in the last 72 hours. Anemia Panel: No results for input(s): VITAMINB12, FOLATE, FERRITIN, TIBC, IRON, RETICCTPCT in the last 72 hours. Urine analysis:    Component Value Date/Time   COLORURINE YELLOW 05/30/2016 1536   APPEARANCEUR CLOUDY (A) 05/30/2016 1536   LABSPEC 1.014 05/30/2016 1536   LABSPEC 1.010 05/19/2015 1231   PHURINE 6.0 05/30/2016 1536   GLUCOSEU NEGATIVE 05/30/2016 1536   GLUCOSEU Negative 05/19/2015 1231   HGBUR LARGE (A) 05/30/2016 1536   BILIRUBINUR NEGATIVE 05/30/2016 1536   BILIRUBINUR Negative 05/19/2015 1231   KETONESUR NEGATIVE 05/30/2016 1536   PROTEINUR 100 (A) 05/30/2016 1536   UROBILINOGEN 0.2  05/19/2015 1231   NITRITE NEGATIVE 05/30/2016 1536   LEUKOCYTESUR LARGE (A) 05/30/2016 1536   LEUKOCYTESUR Negative 05/19/2015 1231   Sepsis Labs: @LABRCNTIP (procalcitonin:4,lacticidven:4)  ) Recent Results (from the past 240 hour(s))  Culture, Urine     Status: Abnormal   Collection Time: 05/25/16 11:48 PM  Result Value Ref Range Status   Specimen Description URINE, CLEAN CATCH  Final   Special Requests NONE  Final   Culture >=100,000 COLONIES/mL PSEUDOMONAS AERUGINOSA (A)  Final   Report Status 05/28/2016 FINAL  Final   Organism ID, Bacteria PSEUDOMONAS AERUGINOSA (A)  Final      Susceptibility   Pseudomonas aeruginosa - MIC*    CEFTAZIDIME 4 SENSITIVE Sensitive     CIPROFLOXACIN <=0.25 SENSITIVE Sensitive     GENTAMICIN <=1 SENSITIVE Sensitive     IMIPENEM 1 SENSITIVE Sensitive     PIP/TAZO <=4 SENSITIVE Sensitive     CEFEPIME 2 SENSITIVE Sensitive     * >=100,000 COLONIES/mL PSEUDOMONAS AERUGINOSA  MRSA PCR Screening     Status: None   Collection Time: 05/26/16 11:34 AM  Result Value Ref Range Status   MRSA by PCR NEGATIVE NEGATIVE Final    Comment:        The GeneXpert MRSA Assay (FDA approved for NASAL specimens only), is one component of a comprehensive MRSA colonization surveillance program. It is not intended to diagnose MRSA infection nor to guide or monitor treatment for MRSA infections.   Urine culture     Status: Abnormal (Preliminary result)   Collection Time: 05/30/16  3:36 PM  Result Value Ref Range Status   Specimen Description URINE, RANDOM  Final   Special Requests Normal  Final   Culture >=100,000 COLONIES/mL PSEUDOMONAS AERUGINOSA (A)  Final   Report Status PENDING  Incomplete  C difficile quick scan w PCR reflex     Status: Abnormal   Collection Time: 05/30/16  3:48 PM  Result Value Ref Range Status   C Diff antigen POSITIVE (A) NEGATIVE Final   C Diff toxin POSITIVE (A) NEGATIVE Final   C Diff interpretation Toxin producing C. difficile  detected.  Final    Comment: CRITICAL RESULT CALLED TO, READ BACK BY AND VERIFIED WITH: MATHIS,M RN [phone] [place]       Radiology Studies: No results found.   Scheduled Meds: . acetaminophen  650 mg Oral BID  . amiodarone  100 mg Oral Daily  . atorvastatin  80 mg Oral QHS  . carvedilol  25 mg Oral BID WC  . cloNIDine  0.1 mg Oral TID  . ferrous sulfate  325 mg Oral Q breakfast  . furosemide  80 mg Intravenous BID  . hydrALAZINE  75 mg Oral QID  . insulin aspart  0-9 Units Subcutaneous TID WC  . isosorbide mononitrate  120 mg Oral Daily  . levothyroxine  50 mcg Oral QAC breakfast  . multivitamin with minerals  1 tablet Oral q morning - 10a  . potassium chloride  40 mEq Oral BID WC  . sodium bicarbonate  650 mg Oral BID  . tamsulosin  0.4 mg Oral QPC supper  . vancomycin  125 mg Oral QID  . warfarin  6 mg Oral ONCE-1800  . Warfarin - Pharmacist Dosing Inpatient   Does not apply q1800   Continuous Infusions: . heparin 1,200 Units/hr (05/31/16 1924)     LOS: 7 days    Time spent: 30 min    Janece Canterbury, MD [facility] Pager [phone]  If 7PM-7AM, please contact night-coverage [email or web address] Password [email or web address] 06/01/2016, 1:21 PM

## 2016-06-02 ENCOUNTER — Ambulatory Visit: Payer: Medicare Other | Admitting: Interventional Cardiology

## 2016-06-02 ENCOUNTER — Telehealth: Payer: Self-pay | Admitting: *Deleted

## 2016-06-02 LAB — CBC
HEMATOCRIT: 25.7 % — AB (ref 39.0–52.0)
HEMOGLOBIN: 8.2 g/dL — AB (ref 13.0–17.0)
MCH: 29.9 pg (ref 26.0–34.0)
MCHC: 31.9 g/dL (ref 30.0–36.0)
MCV: 93.8 fL (ref 78.0–100.0)
Platelets: 160 10*3/uL (ref 150–400)
RBC: 2.74 MIL/uL — AB (ref 4.22–5.81)
RDW: 19.4 % — AB (ref 11.5–15.5)
WBC: 5.5 10*3/uL (ref 4.0–10.5)

## 2016-06-02 LAB — URINE CULTURE: SPECIAL REQUESTS: NORMAL

## 2016-06-02 LAB — GLUCOSE, CAPILLARY
GLUCOSE-CAPILLARY: 92 mg/dL (ref 65–99)
GLUCOSE-CAPILLARY: 144 mg/dL — AB (ref 65–99)
GLUCOSE-CAPILLARY: 128 mg/dL — AB (ref 65–99)
Glucose-Capillary: 232 mg/dL — ABNORMAL HIGH (ref 65–99)

## 2016-06-02 LAB — BASIC METABOLIC PANEL
ANION GAP: 6 (ref 5–15)
BUN: 57 mg/dL — AB (ref 6–20)
CHLORIDE: 108 mmol/L (ref 101–111)
CO2: 21 mmol/L — ABNORMAL LOW (ref 22–32)
Calcium: 8 mg/dL — ABNORMAL LOW (ref 8.9–10.3)
Creatinine, Ser: 2.46 mg/dL — ABNORMAL HIGH (ref 0.61–1.24)
GFR, EST AFRICAN AMERICAN: 27 mL/min — AB (ref >60)
GFR, EST NON AFRICAN AMERICAN: 24 mL/min — AB (ref >60)
Glucose, Bld: 127 mg/dL — ABNORMAL HIGH (ref 65–99)
POTASSIUM: 4.1 mmol/L (ref 3.5–5.1)
SODIUM: 135 mmol/L (ref 135–145)

## 2016-06-02 LAB — PROTIME-INR
INR: 2.05
Prothrombin Time: 23.5 seconds — ABNORMAL HIGH (ref 11.4–15.2)

## 2016-06-02 LAB — HEPARIN LEVEL (UNFRACTIONATED): HEPARIN UNFRACTIONATED: 0.55 [IU]/mL (ref 0.30–0.70)

## 2016-06-02 MED ORDER — WARFARIN SODIUM 6 MG PO TABS
6.0000 mg | ORAL_TABLET | Freq: Once | ORAL | Status: AC
  Administered 2016-06-02: 6 mg via ORAL
  Filled 2016-06-02: qty 1

## 2016-06-02 MED ORDER — ENOXAPARIN SODIUM 80 MG/0.8ML ~~LOC~~ SOLN
80.0000 mg | Freq: Every day | SUBCUTANEOUS | Status: DC
  Administered 2016-06-02 – 2016-06-03 (×2): 80 mg via SUBCUTANEOUS
  Filled 2016-06-02 (×2): qty 0.8

## 2016-06-02 NOTE — Progress Notes (Addendum)
This CM contacted Mrs. [name] for discharge planning. It was confirmed that her plan is to bring pt home.  She was informed that pt has been made an [place] (High Risk for Readmission), and that [facility] would be active with pt more often as a precaution to keep pt out of the hospital. Wife appreciative of additional assistance. Wife states that she has 2 sons that can help transport pt home as she does not want a bill for an ambulance. She was informed that if the sons are not able to transport then an ambulance is always a possibility. Wife requesting a 3in1 and order received. [facility] [employer] rep contacted for 3in1.  This CM filled out Medical Necessity form and printed it along with face sheet in case ambulance transport is needed. Paperwork left on front of shadow chart and RN made aware.  No other DC needs communicated. Marney Doctor RN,BSN,NCM [phone]

## 2016-06-02 NOTE — Progress Notes (Signed)
CSW assisting with d/c planning. PT has recommended SNF placement at d/c. CSW spoke with pt this am. Pt has declined placement at this time. Pt gave CSW permission to contact spouse. Spouse reports that pt was recently at [facility] and owes them payment for prior stay. Pt has [employer] medicare and, according to spouse, pt will owe co payments if placed at SNF. Spouse reports that they are unable to afford co payments. Spouse reports that pt has applied for medicaid and a decision is still pending. Spouse reports that pt will return home. RNCM / MD are aware. RNCM will assist with d/c planning.  Werner Lean LCSW [phone]

## 2016-06-02 NOTE — Progress Notes (Signed)
PROGRESS NOTE  Lejend Dicecco  [id number] DOB: [birth year] DOA: 05/25/2016 PCP: [name], MD  Brief Narrative:   [name]. is a 80 y.o. male with history of chronic diastolic heart failure, DM, paroxysmal a-fib NOT on a/c due to bleeding complications and anemia, CKD stage 4, myelodysplastic syndrome, and recent TV endocarditis.  He was discharged approximately 6 weeks prior to admission after being treated for endocarditis involving the tricuspid valve requiring 6 weeks of IV Ancef.  He received the last dose of IV antibiotic on 05/25/16 and was in the process of having the PICC line removed when the family noticed that he had swelling in the right upper extremity.  Duplex was positive for DVT.  The patient has a complicated history with bleeding, including hemorrhagic cystitis, and he receives biweekly Epogen injections from hematology for his MDS and CKD.  The case was discussed with cardiology and hematology and the decision was made for 6 weeks of anticoagulation starting on 11/14, ending December 25th.   Admitting hospitalist had a long discussion with the patient and his wife and they decided to proceed with anticoagulation.  Given his chronic kidney disease hematology and pharmacy recommended heparin bridge to warfarin.  His hospitalization has been complicated by pseudomonas UTI and C. Diff diarrhea as well as acute on chronic diastolic heart failure.    Assessment & Plan:   Principal Problem:   Acute deep vein thrombosis (DVT) of right upper extremity (HCC) Active Problems:   PAF (paroxysmal atrial fibrillation) (HCC)   PAD (peripheral artery disease) (HCC)   Chronic diastolic heart failure (HCC)   Diabetes mellitus (HCC)   Essential hypertension   NSVT (nonsustained ventricular tachycardia) (HCC)   Anemia of chronic disease   Pacemaker   Acute renal failure superimposed on stage 4 chronic kidney disease (HCC)   Pressure injury of skin  Acute DVT RUE  related to right upper extremity PICC line, INR therapeutic at 2.05 -  Heparin changed to Lovenox 1 dose -  Warfarin per pharmacy ordered -  Bleeding from PIV resolved  -  Followed by Cardiology previously for INR checks--an appointment has been scheduled by Dr. Sheran Fava for  Monday 06/07/2016  Acute on chronic diastolic heart failure due to IVF (heparin).  Edema, shortness of breath, and hyponatremia improving -  Held IV lasix 80mg  BID as well as rising creatinine, aim to resume lasix PO in am 11/23 -  Wt is stable in the 80-83 kg range  C. Diff positive, but diarrhea stopped quickly after cessation of laxatives.  Case discussed with Dr. Tommy Medal from Infectious disease.   -  Continue oral vanc for 2 weeks ending on 06/13/16  Pseudomonas cystitis in setting of C. Diff diarrhea, present at the time of admission, NOT catheter associated.  Case discussed with Dr. Tommy Medal from Infectious disease.   -  Fosfomycin 3gm x 1  CKD stage 4, baseline creatinine 2.2-2.4  Essential hypertension, blood pressures elevated but better -  Continue carvedilol, hydralazine, imdur -  Continue clonidine tid (new this admission)  Recent endocarditis of the tricuspid valve s/p 6 weeks of ancef per Dr. Johnnye Sima, ID  MDS with chronic anemia followed by Dr. Alvy Bimler, hemoglobin down to 8.8g/dl today -  Epogen injection t11/21  Hypothyroidism -  TSH 33.4 -  Increased levothyroxine to 87mcg -  Repeat TSH in 4 weeks  Hx of PAF, managed with beta blocker, stable, now on anticoagulation  Diabetes mellitus type 2, stable,  continue SSI Sugars are ranging 92-127  Stage 2 pressure ulcer on sacrum -  allevyn -  PT rec HH PT -will need OP Home health as doesn't qualify for SNF  Atypical chest pain.  Follow up with cardiology routine outpatient   Hypokalemia, resolved with additional oral potassium  DVT prophylaxis:  Heparin gtt Code Status:  DNR Family Communication:  Patient and his son at the  bedside Disposition Plan:  Pending therapeutic INR  Consultants:   none  Procedures:  none  Antimicrobials:   none    Subjective:  Overall well No dark nor tarry stool NO CP Some SOB and LE swelling    Objective: Vitals:   06/01/16 1817 06/01/16 2200 06/02/16 0449 06/02/16 0621  BP: 135/69 140/70 (!) 156/65   Pulse: 60 60 (!) 58   Resp:  18 18   Temp:  98.2 F (36.8 C) 98.1 F (36.7 C)   TempSrc:  Oral Oral   SpO2:  100% 100%   Weight:    83.3 kg (183 lb 10.3 oz)  Height:        Intake/Output Summary (Last 24 hours) at 06/02/16 1145 Last data filed at 06/02/16 0941  Gross per 24 hour  Intake              600 ml  Output             1075 ml  Net             -475 ml   Filed Weights   05/31/16 0500 06/01/16 0545 06/02/16 0621  Weight: 81.4 kg (179 lb 7.3 oz) 79.1 kg (174 lb 6.1 oz) 83.3 kg (183 lb 10.3 oz)    Examination:  General exam:  Adult male.  No acute distress.   HEENT:  NCAT, MMM Respiratory system: Clear to auscultation bilaterally Cardiovascular system: IRRR, normal S1/S2. No murmurs, rubs, gallops or clicks.  Warm extremities Gastrointestinal system:  Hyperactive bowel sounds, soft, nondistended, nontender, no guarding or rebound MSK:  Normal tone and bulk, 2+ pitting edema of bilateral lower extremities.  Right arm swelling has mostly resolved except for a 3cm area on medial arm just distal to antecubital fossa   Neuro:  Grossly intact    Data Reviewed: I have personally reviewed following labs and imaging studies  CBC:  Recent Labs Lab 05/30/16 0412 05/31/16 0359 05/31/16 1406 06/01/16 0407 06/02/16 0421  WBC 11.8* 7.4 5.7 5.9 5.5  HGB 10.0* 8.4* 8.6* 8.8* 8.2*  HCT 31.3* 26.1* 26.7* 27.3* 25.7*  MCV 92.6 93.5 93.4 93.5 93.8  PLT 209 190 171 183 0000000   Basic Metabolic Panel:  Recent Labs Lab 05/29/16 0421 05/30/16 0412 05/31/16 0359 06/01/16 0407 06/02/16 0421  NA 137 136 134* 134* 135  K 3.1* 2.7* 3.8 3.8 4.1  CL 107  107 107 107 108  CO2 23 21* 21* 22 21*  GLUCOSE 117* 110* 109* 94 127*  BUN 49* 51* 55* 54* 57*  CREATININE 2.21* 2.21* 2.46* 2.45* 2.46*  CALCIUM 8.3* 8.2* 8.0* 8.0* 8.0*   GFR: Estimated Creatinine Clearance: 25.6 mL/min (by C-G formula based on SCr of 2.46 mg/dL (H)). Liver Function Tests: No results for input(s): AST, ALT, ALKPHOS, BILITOT, PROT, ALBUMIN in the last 168 hours. No results for input(s): LIPASE, AMYLASE in the last 168 hours. No results for input(s): AMMONIA in the last 168 hours. Coagulation Profile:  Recent Labs Lab 05/29/16 0421 05/30/16 0412 05/31/16 0359 06/01/16 0407 06/02/16 0421  INR 1.38 1.39  1.76 1.81 2.05   Cardiac Enzymes: No results for input(s): CKTOTAL, CKMB, CKMBINDEX, TROPONINI in the last 168 hours. BNP (last 3 results) No results for input(s): PROBNP in the last 8760 hours. HbA1C: No results for input(s): HGBA1C in the last 72 hours. CBG:  Recent Labs Lab 06/01/16 0749 06/01/16 1201 06/01/16 1623 06/01/16 2206 06/02/16 0728  GLUCAP 87 188* 155* 132* 92   Lipid Profile: No results for input(s): CHOL, HDL, LDLCALC, TRIG, CHOLHDL, LDLDIRECT in the last 72 hours. Thyroid Function Tests: No results for input(s): TSH, T4TOTAL, FREET4, T3FREE, THYROIDAB in the last 72 hours. Anemia Panel: No results for input(s): VITAMINB12, FOLATE, FERRITIN, TIBC, IRON, RETICCTPCT in the last 72 hours. Urine analysis:    Component Value Date/Time   COLORURINE YELLOW 05/30/2016 1536   APPEARANCEUR CLOUDY (A) 05/30/2016 1536   LABSPEC 1.014 05/30/2016 1536   LABSPEC 1.010 05/19/2015 1231   PHURINE 6.0 05/30/2016 1536   GLUCOSEU NEGATIVE 05/30/2016 1536   GLUCOSEU Negative 05/19/2015 1231   HGBUR LARGE (A) 05/30/2016 1536   BILIRUBINUR NEGATIVE 05/30/2016 1536   BILIRUBINUR Negative 05/19/2015 1231   KETONESUR NEGATIVE 05/30/2016 1536   PROTEINUR 100 (A) 05/30/2016 1536   UROBILINOGEN 0.2 05/19/2015 1231   NITRITE NEGATIVE 05/30/2016 1536    LEUKOCYTESUR LARGE (A) 05/30/2016 1536   LEUKOCYTESUR Negative 05/19/2015 1231   Sepsis Labs: @LABRCNTIP (procalcitonin:4,lacticidven:4)  ) Recent Results (from the past 240 hour(s))  Culture, Urine     Status: Abnormal   Collection Time: 05/25/16 11:48 PM  Result Value Ref Range Status   Specimen Description URINE, CLEAN CATCH  Final   Special Requests NONE  Final   Culture >=100,000 COLONIES/mL PSEUDOMONAS AERUGINOSA (A)  Final   Report Status 05/28/2016 FINAL  Final   Organism ID, Bacteria PSEUDOMONAS AERUGINOSA (A)  Final      Susceptibility   Pseudomonas aeruginosa - MIC*    CEFTAZIDIME 4 SENSITIVE Sensitive     CIPROFLOXACIN <=0.25 SENSITIVE Sensitive     GENTAMICIN <=1 SENSITIVE Sensitive     IMIPENEM 1 SENSITIVE Sensitive     PIP/TAZO <=4 SENSITIVE Sensitive     CEFEPIME 2 SENSITIVE Sensitive     * >=100,000 COLONIES/mL PSEUDOMONAS AERUGINOSA  MRSA PCR Screening     Status: None   Collection Time: 05/26/16 11:34 AM  Result Value Ref Range Status   MRSA by PCR NEGATIVE NEGATIVE Final    Comment:        The GeneXpert MRSA Assay (FDA approved for NASAL specimens only), is one component of a comprehensive MRSA colonization surveillance program. It is not intended to diagnose MRSA infection nor to guide or monitor treatment for MRSA infections.   Urine culture     Status: Abnormal   Collection Time: 05/30/16  3:36 PM  Result Value Ref Range Status   Specimen Description URINE, RANDOM  Final   Special Requests Normal  Final   Culture >=100,000 COLONIES/mL PSEUDOMONAS AERUGINOSA (A)  Final   Report Status 06/02/2016 FINAL  Final   Organism ID, Bacteria PSEUDOMONAS AERUGINOSA (A)  Final      Susceptibility   Pseudomonas aeruginosa - MIC*    CEFTAZIDIME 4 SENSITIVE Sensitive     CIPROFLOXACIN <=0.25 SENSITIVE Sensitive     GENTAMICIN <=1 SENSITIVE Sensitive     IMIPENEM <=0.25 SENSITIVE Sensitive     PIP/TAZO <=4 SENSITIVE Sensitive     CEFEPIME 2 SENSITIVE  Sensitive     * >=100,000 COLONIES/mL PSEUDOMONAS AERUGINOSA  C difficile quick scan w PCR reflex  Status: Abnormal   Collection Time: 05/30/16  3:48 PM  Result Value Ref Range Status   C Diff antigen POSITIVE (A) NEGATIVE Final   C Diff toxin POSITIVE (A) NEGATIVE Final   C Diff interpretation Toxin producing C. difficile detected.  Final    Comment: CRITICAL RESULT CALLED TO, READ BACK BY AND VERIFIED WITH: MATHIS,M RN [phone] [place]       Radiology Studies: No results found.   Scheduled Meds: . acetaminophen  650 mg Oral BID  . amiodarone  100 mg Oral Daily  . atorvastatin  80 mg Oral QHS  . carvedilol  25 mg Oral BID WC  . cloNIDine  0.1 mg Oral TID  . enoxaparin (LOVENOX) injection  80 mg Subcutaneous Daily  . ferrous sulfate  325 mg Oral Q breakfast  . hydrALAZINE  75 mg Oral QID  . insulin aspart  0-9 Units Subcutaneous TID WC  . isosorbide mononitrate  120 mg Oral Daily  . levothyroxine  50 mcg Oral QAC breakfast  . multivitamin with minerals  1 tablet Oral q morning - 10a  . potassium chloride  40 mEq Oral BID WC  . sodium bicarbonate  650 mg Oral BID  . tamsulosin  0.4 mg Oral QPC supper  . vancomycin  125 mg Oral QID  . warfarin  6 mg Oral ONCE-1800  . Warfarin - Pharmacist Dosing Inpatient   Does not apply q1800   Continuous Infusions:    LOS: 8 days    Time spent: 20 min   Verneita Griffes, MD [facility] [phone]

## 2016-06-02 NOTE — Progress Notes (Addendum)
ANTICOAGULATION CONSULT NOTE - Follow Up  Pharmacy Consult for Heparin and warfarin Indication: DVT  Allergies  Allergen Reactions  . Nsaids Nausea Only    GI issue  . Ibuprofen Other (See Comments)    GI Issues  . Ace Inhibitors Cough    Patient Measurements: Height: 5\' 10"  (177.8 cm) Weight: 183 lb 10.3 oz (83.3 kg) IBW/kg (Calculated) : 73 Heparin Dosing Weight: actual body weight  Vital Signs: Temp: 98.1 F (36.7 C) (11/22 0449) Temp Source: Oral (11/22 0449) BP: 156/65 (11/22 0449) Pulse Rate: 58 (11/22 0449)  Labs:  Recent Labs  05/31/16 0359 05/31/16 1406 06/01/16 0407 06/02/16 0421  HGB 8.4* 8.6* 8.8* 8.2*  HCT 26.1* 26.7* 27.3* 25.7*  PLT 190 171 183 160  LABPROT 20.8*  --  21.2* 23.5*  INR 1.76  --  1.81 2.05  HEPARINUNFRC 0.59  --  0.46 0.55  CREATININE 2.46*  --  2.45* 2.46*    Estimated Creatinine Clearance: 25.6 mL/min (by C-G formula based on SCr of 2.46 mg/dL (H)).  Medications: Infusions:  . heparin 1,200 Units/hr (06/01/16 1548)    Assessment: 7 yoM with ultrasound confirmed right arm DVT, noted swelling while PICC line (completed abx for endocarditis on 11/14) was being removed.  PMH also significant for prostate cancer, AFib, CAD, CKD, DM, H/O DVT (2011), Myelodysplastic syndrome, hemorrhagic cystitis (likely from radiation treatment), and h/o GI bleed (08/2015).  Pharmacy consulted to dose Heparin and warfarin.  Today, 06/02/2016:  Heparin level 0.55, remains therapeutic  INR 2.05 has increased to therapeutic range   Has required increasing doses of: 4mg , 4mg , 6mg ,6mg , 7.5mg ,7.5mg , 6mg   CBC:  Hgb 8.2 is slightly decreased (admission Hgb 10.2) and Plts stable  No bleeding reported, no IV complications or interruptions.  Scr 2.46, CKD  DDI: amiodarone may inhibit hepatic metabolism and increase the anticoagulation effect of warfarin.   Note pt previously on warfarin in 2013 with doses of 5mg  daily except 2.5mg  on saturdays (was  on same dose of amiodarone as currently)  Goal of Therapy:  INR 2-3 Heparin level 0.3-0.7 units/ml Monitor platelets by anticoagulation protocol: Yes   Plan:  Continue heparin IV infusion at 1200 units/hr Warfarin 6mg  PO today at 1800 x1 dose Daily INR, heparin level, and CBC  Continue to monitor H&H and platelets  Today is D8 of overlap of UFH/warfarin - patient will need at least therapeutic INR x 24 hours before stopping heparin.  Recommend stopping heparin on 11/23 if INR remains therapeutic.  Gretta Arab PharmD, BCPS Pager [phone] 06/02/2016 7:13 AM   Addendum: Pharmacy is consulted to change from Heparin to Lovenox for remainder of warfarin overlap. Plan:  Stop heparin drip at 10:00.  At 11:00 Start Lovenox 80 mg SQ q24h for CrCl < 30 ml/min.  For discharge, would recommend warfarin 6mg  daily and INR recheck no later than Monday, 11/27.  Per MD, appointment for warfarin arranged with cardiology on Monday 11/27.  Gretta Arab PharmD, BCPS Pager [phone] 06/02/2016 9:21 AM

## 2016-06-02 NOTE — Telephone Encounter (Signed)
Spoke with Mrs Wendland and she wants his INR to be done by Nurse in the home with Home Health Pt at present in still in the hospital and she states he is to go home tomorrow.This nurse called [facility] and the referral intake nurse states they have had him but since has been in the hospital will need new orders and at present they have not received referral Called Mrs Marcia back and she now states his sons say they can bring him for his appt on Monday and instructed Mrs Crull to be sure [facility] has the referral for him to be seen in the home So at present he is to come to coumadin clinic on Monday

## 2016-06-03 LAB — BASIC METABOLIC PANEL
Anion gap: 7 (ref 5–15)
BUN: 59 mg/dL — AB (ref 6–20)
CHLORIDE: 108 mmol/L (ref 101–111)
CO2: 21 mmol/L — ABNORMAL LOW (ref 22–32)
CREATININE: 2.45 mg/dL — AB (ref 0.61–1.24)
Calcium: 8.3 mg/dL — ABNORMAL LOW (ref 8.9–10.3)
GFR calc Af Amer: 27 mL/min — ABNORMAL LOW (ref >60)
GFR calc non Af Amer: 24 mL/min — ABNORMAL LOW (ref >60)
GLUCOSE: 101 mg/dL — AB (ref 65–99)
POTASSIUM: 4.6 mmol/L (ref 3.5–5.1)
SODIUM: 136 mmol/L (ref 135–145)

## 2016-06-03 LAB — CBC
HEMATOCRIT: 25.3 % — AB (ref 39.0–52.0)
Hemoglobin: 8.5 g/dL — ABNORMAL LOW (ref 13.0–17.0)
MCH: 31 pg (ref 26.0–34.0)
MCHC: 33.6 g/dL (ref 30.0–36.0)
MCV: 92.3 fL (ref 78.0–100.0)
PLATELETS: 161 10*3/uL (ref 150–400)
RBC: 2.74 MIL/uL — ABNORMAL LOW (ref 4.22–5.81)
RDW: 19.3 % — AB (ref 11.5–15.5)
WBC: 5.3 10*3/uL (ref 4.0–10.5)

## 2016-06-03 LAB — GLUCOSE, CAPILLARY
Glucose-Capillary: 82 mg/dL (ref 65–99)
Glucose-Capillary: 121 mg/dL — ABNORMAL HIGH (ref 65–99)

## 2016-06-03 LAB — PROTIME-INR
INR: 2.18
Prothrombin Time: 24.6 seconds — ABNORMAL HIGH (ref 11.4–15.2)

## 2016-06-03 MED ORDER — VANCOMYCIN 50 MG/ML ORAL SOLUTION: 125.0000 mg | Freq: Four times a day (QID) | ORAL | 0 refills | Status: DC

## 2016-06-03 MED ORDER — WARFARIN SODIUM 6 MG PO TABS: 6.0000 mg | ORAL_TABLET | Freq: Once | ORAL | Status: DC

## 2016-06-03 MED ORDER — WARFARIN SODIUM 6 MG PO TABS: 6.0000 mg | ORAL_TABLET | Freq: Once | ORAL | 0 refills | Status: DC

## 2016-06-03 MED ORDER — CLONIDINE HCL 0.1 MG PO TABS: 0.1000 mg | ORAL_TABLET | Freq: Three times a day (TID) | ORAL | 11 refills | Status: DC

## 2016-06-03 NOTE — Progress Notes (Signed)
Patient discharged to home, all discharge medications and instructions reviewed and questions answered.  Patient assisted to vehicle by wheelchair.  

## 2016-06-03 NOTE — Care Management Note (Signed)
Case Management Note  Patient Details  Name: [name] PRETORIUS Sr. MRN: [id number] Date of Birth: 1936-07-04  Subjective/Objective:  DVT, PAF                  Action/Plan: Discharge Planning: AVS reviewed: NCM spoke to wife. States he has 3n1 bedside commode. Has RW at home also. Her son, Juanda Crumble will pick him up at dc. Contacted [facility] to make aware of dc home today with HH. Pt active with [facility].   Please see previous NCM notes.   Expected Discharge Date:  06/03/2016             Expected Discharge Plan:  [place]  In-House Referral:  NA  Discharge planning Services  CM Consult  Post Acute Care Choice:  Home Health Choice offered to:  Patient  DME Arranged:  3-N-1 DME Agency:  [place]:  RN [facility] Agency:  [place]  Status of Service:  Completed, signed off  If discussed at [place] of Stay Meetings, dates discussed:    Additional Comments:  Erenest Rasher, RN 06/03/2016, 9:58 AM

## 2016-06-03 NOTE — Progress Notes (Signed)
ANTICOAGULATION CONSULT NOTE - Follow Up  Pharmacy Consult for Lovenox, warfarin Indication: DVT  Allergies  Allergen Reactions  . Nsaids Nausea Only    GI issue  . Ibuprofen Other (See Comments)    GI Issues  . Ace Inhibitors Cough    Patient Measurements: Height: 5\' 10"  (177.8 cm) Weight: 183 lb 3.2 oz (83.1 kg) IBW/kg (Calculated) : 73 Heparin Dosing Weight: actual body weight  Vital Signs: Temp: 99.1 F (37.3 C) (11/23 0557) Temp Source: Oral (11/23 0557) BP: 148/67 (11/23 0557) Pulse Rate: 67 (11/23 0557)  Labs:  Recent Labs  06/01/16 0407 06/02/16 0421 06/03/16 0431  HGB 8.8* 8.2* 8.5*  HCT 27.3* 25.7* 25.3*  PLT 183 160 161  LABPROT 21.2* 23.5* 24.6*  INR 1.81 2.05 2.18  HEPARINUNFRC 0.46 0.55  --   CREATININE 2.45* 2.46* 2.45*    Estimated Creatinine Clearance: 25.7 mL/min (by C-G formula based on SCr of 2.45 mg/dL (H)).   Assessment: 8 yoM with ultrasound confirmed right arm DVT, noted swelling while PICC line (completed abx for endocarditis on 11/14) was being removed.  PMH also significant for prostate cancer, AFib, CAD, CKD, DM, H/O DVT (2011), Myelodysplastic syndrome, hemorrhagic cystitis (likely from radiation treatment), and h/o GI bleed (08/2015).  Pharmacy initially consulted to dose Heparin and warfarin, on 11/22 Heparin changed to Lovenox.  Today, 06/03/2016:  INR 2.18, remains in therapeutic range    Has required doses of: 4mg , 4mg , 6mg ,6mg , 7.5mg ,7.5mg , 6mg , 6mg .  CBC:  Hgb 8.5 is fairly stable (admission Hgb 10.2) and Plts stable  No bleeding reported, no IV complications or interruptions.  Scr 2.45, CKD  DDI: amiodarone may inhibit hepatic metabolism and increase the anticoagulation effect of warfarin.   Note pt previously on warfarin in 2013 with doses of 5mg  daily except 2.5mg  on saturdays (was on same dose of amiodarone as currently)  Goal of Therapy:  INR 2-3 Monitor platelets by anticoagulation protocol: Yes   Plan:   D/C Lovenox as INR remains therapeutic > 24 hrs and has completed 8 days of parenteral bridging. Warfarin 6mg  PO today at 1800 x1 dose Daily INR, and CBC  Continue to monitor H&H and platelets  For discharge, would recommend warfarin 6mg  daily and INR recheck no later than Monday, 11/27.  Per MD, appointment for warfarin arranged with cardiology on Monday 11/27.  Gretta Arab PharmD, BCPS Pager [phone] 06/03/2016 8:34 AM

## 2016-06-03 NOTE — Discharge Summary (Signed)
Physician Discharge Summary  [name] [id number] DOB: 1935/08/26 DOA: 05/25/2016  PCP: [name], MD  Admit date: 05/25/2016 Discharge date: 06/03/2016  Time spent: 40 minutes  Recommendations for Outpatient Follow-up:  1. Complete vancomycin 06/13/2016 which would be 14 day treatment for C. difficile colitis 2. INR is needed 06/07/2016-A she was given option of having this either reported to Dr. Tamala Julian at cardiologist's office and having labs drawn by home health or getting a visit at cardiologist's office on 11 27 2017-duration of anticoagulation should be 6 weeks total as per below note---new right upper extremity DVT 3. Patient should follow-up with nephrologist who has seen him in the past Dr. Jimmy Footman as per PCP discretion and probably would benefit from renal panel in about one week 4. Follow-up as scheduled with oncologist/hematologist for myelodysplasia 5. Recommend TSH in a couple of weeks says as on amiodarone 6. He has been instructed to resume his Lasix on 11 24 2000 1780 mg twice a day  Discharge Diagnoses:  Principal Problem:   Acute deep vein thrombosis (DVT) of right upper extremity (HCC) Active Problems:   PAF (paroxysmal atrial fibrillation) (HCC)   PAD (peripheral artery disease) (HCC)   Chronic diastolic heart failure (HCC)   Diabetes mellitus (HCC)   Essential hypertension   NSVT (nonsustained ventricular tachycardia) (HCC)   Anemia of chronic disease   Pacemaker   Acute renal failure superimposed on stage 4 chronic kidney disease (HCC)   Pressure injury of skin   Discharge Condition: improved  Diet recommendation:  hh low salt  Filed Weights   06/01/16 0545 06/02/16 0621 06/03/16 0558  Weight: 79.1 kg (174 lb 6.1 oz) 83.3 kg (183 lb 10.3 oz) 83.1 kg (183 lb 3.2 oz)    History of present illness:    80 y.o. male with history of chronic diastolic heart failure, DM, paroxysmal a-fib NOT on a/c due to bleeding complications and  anemia, CKD stage 4, myelodysplastic syndrome, and recent TV endocarditis.    He was discharged approximately 6 weeks prior to admission after being treated for endocarditis involving the tricuspid valve requiring 6 weeks of IV Ancef.    He received the last dose of IV antibiotic on 05/25/16 and was in the process of having the PICC line removed when the family noticed that he had swelling in the right upper extremity.   Duplex was positive for DVT.    The patient has a complicated history with bleeding, including hemorrhagic cystitis, and he receives biweekly Epogen injections from hematology for his MDS and CKD    The case was discussed with cardiology and hematology and the decision was made for 6 weeks of anticoagulation starting on 11/14, ending December 25th.     Admitting hospitalist had a long discussion with the patient and his wife and they decided to proceed with anticoagulation.  Given his chronic kidney disease hematology and pharmacy recommended heparin bridge to warfarin.  His hospitalization has been complicated by pseudomonas UTI and C. Diff diarrhea as well as acute on chronic diastolic heart failure.    Acute DVT RUE related to right upper extremity PICC line, INR therapeutic at 2.05 -  Heparin changed to Lovenox 1 dose -  Warfarin 6 mg daily-5 doses prescribed and needs close follow-up on INR -  Bleeding from PIV resolved  -  Followed by Cardiology previously for INR checks--an appointment has been scheduled by Dr. Sheran Fava for  Monday 06/07/2016, can also be performed by home health but adjusted  by cardiology office   Acute on chronic diastolic heart failure due to IVF (heparin).  Edema, shortness of breath, and hyponatremia improving -  Held IV lasix 80mg  BID as well as rising creatinine, aim to resume lasix PO in am 11/23 -  Wt is stable in the 80-83 kg range   C. Diff positive, but diarrhea stopped quickly after cessation of laxatives.  Case discussed with Dr. Tommy Medal from  Infectious disease.   -  Continue oral vanc for 2 weeks ending on 06/13/16   Pseudomonas cystitis in setting of C. Diff diarrhea, present at the time of admission, NOT catheter associated.  Case discussed with Dr. Tommy Medal from Infectious disease.   -  Fosfomycin 3gm x 1   CKD stage 4, baseline creatinine 2.2-2.4 will need outpatient follow-up with Dr. guarding   Essential hypertension, blood pressures elevated but better -  Continue carvedilol, hydralazine, imdur -  Continue clonidine tid (new this admission)   Recent endocarditis of the tricuspid valve s/p 6 weeks of ancef per Dr. Johnnye Sima, ID   MDS with chronic anemia followed by Dr. Alvy Bimler, hemoglobin down to 8.8g/dl today -  Epogen injection t11/21   Hypothyroidism -  TSH 33.4 -  Increased levothyroxine to 68mcg -  Repeat TSH in 4 weeks   Hx of PAF, managed with beta blocker, stable, now on anticoagulation   Diabetes mellitus type 2, stable, continue SSI Sugars are ranging 92-127   Stage 2 pressure ulcer on sacrum -  allevyn -  PT rec HH PT -will need OP Home health as doesn't qualify for SNF   Atypical chest pain.  Follow up with cardiology routine outpatient    Hypokalemia, resolved with additional oral potassium   Discharge Exam: Vitals:   06/02/16 2136 06/03/16 0557  BP: 133/69 (!) 148/67  Pulse: (!) 58 67  Resp: 18 16  Temp: 98.7 F (37.1 C) 99.1 F (37.3 C)    General: Alert pleasant oriented no apparent distress Cardiovascular: S1-S2 no murmur rub or gallop Respiratory: Clinically clear no added sound abd soft nt nd no rebodun no guard Grade 2LE edema, some excoriation to sacral skin  Discharge Instructions   Discharge Instructions    Diet - low sodium heart healthy    Complete by:  As directed    Discharge instructions    Complete by:  As directed    He will need close follow-up for your blood ending count called the INR and this should be followed up by either home health or Dr. Tamala Julian at  cardiology office You'll only receive a limited amount of the warfarin tablets as the dosage will change depending on lab work so make sure that lab work is performed on 06/07/2016 as already scheduled Continue vancomycin orally until 06/13/2016 to complete treatment for the diarrheal illness You should follow-up with your kidney doctor as an outpatient and get labs in about 5 to 7 days probably at the same time as her other lab work   Increase activity slowly    Complete by:  As directed      Current Discharge Medication List    START taking these medications   Details  cloNIDine (CATAPRES) 0.1 MG tablet Take 1 tablet (0.1 mg total) by mouth 3 (three) times daily. Qty: 60 tablet, Refills: 11    vancomycin (VANCOCIN) 50 mg/mL oral solution Take 2.5 mLs (125 mg total) by mouth 4 (four) times daily. Qty: 110 mL, Refills: 0    warfarin (COUMADIN) 6  MG tablet Take 1 tablet (6 mg total) by mouth one time only at 6 PM. Qty: 5 tablet, Refills: 0      CONTINUE these medications which have NOT CHANGED   Details  acetaminophen (ARTHRITIS PAIN RELIEF) 650 MG CR tablet Take 650 mg by mouth 2 (two) times daily.     amiodarone (PACERONE) 200 MG tablet Take one-half tablet by  mouth daily Qty: 45 tablet, Refills: 2    atorvastatin (LIPITOR) 80 MG tablet Take 80 mg by mouth at bedtime.     CALCIUM PO Take 1 tablet by mouth daily.    carvedilol (COREG) 25 MG tablet Take 1 tablet (25 mg total) by mouth 2 (two) times daily with a meal. Qty: 60 tablet, Refills: 0    cyanocobalamin (,VITAMIN B-12,) 1000 MCG/ML injection Inject 1,000 mcg into the muscle every 30 (thirty) days.     epoetin alfa (EPOGEN,PROCRIT) 16109 UNIT/ML injection Inject 10,000 Units into the skin every 14 (fourteen) days. Done at [facility] - last injection 09/22/15    ferrous sulfate 325 (65 FE) MG tablet Take 1 tablet (325 mg total) by mouth 3 (three) times daily with meals. Refills: 3    furosemide (LASIX) 80 MG  tablet Take 1 tablet (80 mg total) by mouth every other day. Take 1 tablet by mouth as needed for fluid retention and edema Qty: 15 tablet, Refills: 3    hydrALAZINE (APRESOLINE) 25 MG tablet Take 3 tablets (75 mg total) by mouth 4 (four) times daily.    isosorbide mononitrate (IMDUR) 120 MG 24 hr tablet Take 1 tablet by mouth  daily Qty: 90 tablet, Refills: 2    leuprolide (LUPRON) 30 MG injection Inject 30 mg into the muscle every 4 (four) months.    levothyroxine (SYNTHROID, LEVOTHROID) 25 MCG tablet Take 25 mcg by mouth daily before breakfast.    Multiple Vitamin (MULTIVITAMIN WITH MINERALS) TABS tablet Take 1 tablet by mouth every morning.    Nutritional Supplements (FEEDING SUPPLEMENT, GLUCERNA 1.2 CAL,) LIQD Take 237 mLs by mouth 2 (two) times daily as needed (nutritional supplement).    NYAMYC powder Apply 1 application topically 2 (two) times daily.    potassium chloride SA (K-DUR,KLOR-CON) 20 MEQ tablet Take 1 tablet by mouth twice a day on the days you take your Lasix (every other day) Qty: 60 tablet, Refills: 1    senna-docusate (SENOKOT-S) 8.6-50 MG tablet Take 2 tablets by mouth 2 (two) times daily.    silodosin (RAPAFLO) 8 MG CAPS capsule Take 8 mg by mouth at bedtime.     sodium bicarbonate 650 MG tablet Take 1 tablet (650 mg total) by mouth 2 (two) times daily.      STOP taking these medications     ceFAZolin (ANCEF) 1 GM/50ML        Allergies  Allergen Reactions  . Nsaids Nausea Only    GI issue  . Ibuprofen Other (See Comments)    GI Issues  . Ace Inhibitors Cough   Follow-up Information    [facility] Follow up on 06/07/2016.   Why:  12:30 PM  INR check Contact information: [place] [phone]           The results of significant diagnostics from this hospitalization (including imaging, microbiology, ancillary and laboratory) are listed  below for reference.    Significant Diagnostic Studies: Dg Chest Portable 1 View  Result Date: 05/25/2016 CLINICAL DATA:  Shortness of breath, chest pain, dizziness. EXAM: PORTABLE CHEST 1 VIEW COMPARISON:  04/24/2016 FINDINGS: Right PICC line tip remains in the SVC, unchanged. Prior CABG. There is cardiomegaly. Small bilateral pleural effusions with bibasilar atelectasis. No overt edema. No acute bony abnormality. IMPRESSION: Small bilateral pleural effusions with bibasilar atelectasis. Cardiomegaly.  No overt failure. Electronically Signed   By: Rolm Baptise M.D.   On: 05/25/2016 15:52    Microbiology: Recent Results (from the past 240 hour(s))  Culture, Urine     Status: Abnormal   Collection Time: 05/25/16 11:48 PM  Result Value Ref Range Status   Specimen Description URINE, CLEAN CATCH  Final   Special Requests NONE  Final   Culture >=100,000 COLONIES/mL PSEUDOMONAS AERUGINOSA (A)  Final   Report Status 05/28/2016 FINAL  Final   Organism ID, Bacteria PSEUDOMONAS AERUGINOSA (A)  Final      Susceptibility   Pseudomonas aeruginosa - MIC*    CEFTAZIDIME 4 SENSITIVE Sensitive     CIPROFLOXACIN <=0.25 SENSITIVE Sensitive     GENTAMICIN <=1 SENSITIVE Sensitive     IMIPENEM 1 SENSITIVE Sensitive     PIP/TAZO <=4 SENSITIVE Sensitive     CEFEPIME 2 SENSITIVE Sensitive     * >=100,000 COLONIES/mL PSEUDOMONAS AERUGINOSA  MRSA PCR Screening     Status: None   Collection Time: 05/26/16 11:34 AM  Result Value Ref Range Status   MRSA by PCR NEGATIVE NEGATIVE Final    Comment:        The GeneXpert MRSA Assay (FDA approved for NASAL specimens only), is one component of a comprehensive MRSA colonization surveillance program. It is not intended to diagnose MRSA infection nor to guide or monitor treatment for MRSA infections.   Urine culture     Status: Abnormal   Collection Time: 05/30/16  3:36 PM  Result Value Ref Range Status   Specimen Description URINE, RANDOM  Final   Special  Requests Normal  Final   Culture >=100,000 COLONIES/mL PSEUDOMONAS AERUGINOSA (A)  Final   Report Status 06/02/2016 FINAL  Final   Organism ID, Bacteria PSEUDOMONAS AERUGINOSA (A)  Final      Susceptibility   Pseudomonas aeruginosa - MIC*    CEFTAZIDIME 4 SENSITIVE Sensitive     CIPROFLOXACIN <=0.25 SENSITIVE Sensitive     GENTAMICIN <=1 SENSITIVE Sensitive     IMIPENEM <=0.25 SENSITIVE Sensitive     PIP/TAZO <=4 SENSITIVE Sensitive     CEFEPIME 2 SENSITIVE Sensitive     * >=100,000 COLONIES/mL PSEUDOMONAS AERUGINOSA  C difficile quick scan w PCR reflex     Status: Abnormal   Collection Time: 05/30/16  3:48 PM  Result Value Ref Range Status   C Diff antigen POSITIVE (A) NEGATIVE Final   C Diff toxin POSITIVE (A) NEGATIVE Final   C Diff interpretation Toxin producing C. difficile detected.  Final    Comment: CRITICAL RESULT CALLED TO, READ BACK BY AND VERIFIED WITH: MATHIS,M RN [id number] COV[place],N      Labs: Basic Metabolic Panel:  Recent Labs Lab 05/30/16 0412 05/31/16 0359 06/01/16 0407 06/02/16 0421 06/03/16 0431  NA 136 134* 134* 135 136  K 2.7* 3.8 3.8 4.1 4.6  CL 107 107 107 108 108  CO2 21* 21* 22 21* 21*  GLUCOSE 110* 109* 94 127* 101*  BUN 51* 55* 54* 57* 59*  CREATININE 2.21* 2.46* 2.45* 2.46* 2.45*  CALCIUM 8.2* 8.0* 8.0* 8.0* 8.3*   Liver Function Tests: No results for input(s): AST, ALT, ALKPHOS, BILITOT, PROT,  ALBUMIN in the last 168 hours. No results for input(s): LIPASE, AMYLASE in the last 168 hours. No results for input(s): AMMONIA in the last 168 hours. CBC:  Recent Labs Lab 05/31/16 0359 05/31/16 1406 06/01/16 0407 06/02/16 0421 06/03/16 0431  WBC 7.4 5.7 5.9 5.5 5.3  HGB 8.4* 8.6* 8.8* 8.2* 8.5*  HCT 26.1* 26.7* 27.3* 25.7* 25.3*  MCV 93.5 93.4 93.5 93.8 92.3  PLT 190 171 183 160 161   Cardiac Enzymes: No results for input(s): CKTOTAL, CKMB, CKMBINDEX, TROPONINI in the last 168 hours. BNP: BNP (last 3 results) No results for  input(s): BNP in the last 8760 hours.  ProBNP (last 3 results) No results for input(s): PROBNP in the last 8760 hours.  CBG:  Recent Labs Lab 06/02/16 0728 06/02/16 1205 06/02/16 1612 06/02/16 2105 06/03/16 0715  GLUCAP 92 232* 144* 128* 82       Signed:  Nita Sells MD   [facility] 06/03/2016, 9:34 AM

## 2016-06-07 ENCOUNTER — Ambulatory Visit (INDEPENDENT_AMBULATORY_CARE_PROVIDER_SITE_OTHER): Payer: Medicare Other | Admitting: Infectious Diseases

## 2016-06-07 ENCOUNTER — Encounter: Payer: Self-pay | Admitting: Infectious Diseases

## 2016-06-07 ENCOUNTER — Ambulatory Visit (INDEPENDENT_AMBULATORY_CARE_PROVIDER_SITE_OTHER): Payer: Medicare Other | Admitting: *Deleted

## 2016-06-07 VITALS — BP 153/75 | HR 56 | Temp 97.5°F | Ht 70.0 in | Wt 172.8 lb

## 2016-06-07 DIAGNOSIS — I82621 Acute embolism and thrombosis of deep veins of right upper extremity: Secondary | ICD-10-CM | POA: Diagnosis not present

## 2016-06-07 DIAGNOSIS — N184 Chronic kidney disease, stage 4 (severe): Secondary | ICD-10-CM | POA: Diagnosis not present

## 2016-06-07 DIAGNOSIS — I4891 Unspecified atrial fibrillation: Secondary | ICD-10-CM | POA: Insufficient documentation

## 2016-06-07 DIAGNOSIS — Z5181 Encounter for therapeutic drug level monitoring: Secondary | ICD-10-CM | POA: Diagnosis not present

## 2016-06-07 DIAGNOSIS — A0472 Enterocolitis due to Clostridium difficile, not specified as recurrent: Secondary | ICD-10-CM

## 2016-06-07 DIAGNOSIS — I368 Other nonrheumatic tricuspid valve disorders: Secondary | ICD-10-CM | POA: Diagnosis not present

## 2016-06-07 DIAGNOSIS — I079 Rheumatic tricuspid valve disease, unspecified: Secondary | ICD-10-CM

## 2016-06-07 LAB — POCT INR: INR: 2.2

## 2016-06-07 MED ORDER — WARFARIN SODIUM 6 MG PO TABS: ORAL_TABLET | ORAL | 1 refills | Status: DC

## 2016-06-07 NOTE — Progress Notes (Signed)
   Subjective:    Patient ID: [name] Brow., male    DOB: March 05, 1936, 80 y.o.   MRN: [id number]  HPI 43 y M with hx of afib, CAD/CABG (2013), PVD, DM2 (since 1998), MDS, radiation cystitis with chronic hematuria and stage 4 CKD. He was adm to hospital 7-7 to 7-25 with multifocal pneumonia, MSSA bacteremia and pacemaker endocarditis. He had a negative TEE 7-17. He had his pacer removed on 7-18 and was d/c to SNF on ancef and rifampin with an end date of 8-3.  His repeat BCx on 7-8 were (-).   He was last adm to hospital on 8-11 to 8-15 with new seizures. He was also found to have Hgb of 3.4 with hematuria (chronic).  His sz w/u was negative.  He was dx at his SNF with C diff in the intervening period.  He returns to hospital on 9-28 with worsening abd pain and SOB. He was afebrile, he had a WBC of 12.3. He had a C CT scan of abd showing acute colitis. He was started on cipro/flagyl then changed to po vanco/IV flagyl. His C diff was +.  His course is also noted for t wave inversions and significant htn. Due to CHF he underwent TTE which showed a tricuspid vegetation, he then had a TEE on 10-2 showing: Tricuspid valve is significantly thickened, predominantly the posterior leaflet. There is a mobile echodensity measuring 1.6 x 1.5 cm attached to the posterior leaflet suspicious for a vegetation.His BCx were negative, he was d/c home on 14 days of po vanco (for C diff) and 6 weeks of ancef (his prev BCx were 01-2016 MSSA).  He was then sent back to hospital on 11-14 after he was noted to have worsening Cr and increasing potassium. He completed his ancef on that date. He was also noted on 11-14 to have a RUE DVT when his PIC was removed.  He was d/c home on 11-23. He received a second round of treatment for C diff. He was recommended to receive 6 weeks of anti-coagulation for his DVT (end 07-05-16). He was started on coumadin. Has had f/u with Dr Thompson Caul office for his PT/INR.  In hospital, he was also  found to have Pseudomonas cystitis, he was given fosfomycin x 1.  At d/c his Cr was 2.43.   States his Bm are normal now.  Denies fevers. No shaking chills but has felt cold.  No RUE swelling.    Review of Systems  Constitutional: Negative for chills and fever.  Respiratory: Positive for shortness of breath.   Gastrointestinal: Negative for constipation and diarrhea.  Genitourinary: Negative for difficulty urinating and hematuria.  + DOE.  No foamy urine.     Objective:   Physical Exam  Constitutional: He appears well-developed and well-nourished.  HENT:  Mouth/Throat: No oropharyngeal exudate.  Eyes: EOM are normal. Pupils are equal, round, and reactive to light.  Neck: Neck supple.  Cardiovascular: Normal rate, regular rhythm and normal heart sounds.   Pulmonary/Chest: Effort normal and breath sounds normal.  Abdominal: Soft. Bowel sounds are normal. There is no tenderness. There is no rebound.  Musculoskeletal: He exhibits edema.       Arms: Lymphadenopathy:    He has no cervical adenopathy.      Assessment & Plan:

## 2016-06-07 NOTE — Patient Instructions (Signed)

## 2016-06-07 NOTE — Assessment & Plan Note (Signed)
Has f/u scheduled with Dr Alvy Bimler.  Has been getting PT/INR at Dr Thompson Caul office.

## 2016-06-07 NOTE — Assessment & Plan Note (Signed)
Finishing his course of po vanco Appears to be improved.

## 2016-06-07 NOTE — Assessment & Plan Note (Signed)
He is doing well Will repeat his BCx If negative, no further ID f/u.

## 2016-06-07 NOTE — Assessment & Plan Note (Signed)
Slightly worsened in hospital.  Will f/u with [facility], my great appreciation to them.

## 2016-06-14 ENCOUNTER — Telehealth: Payer: Self-pay | Admitting: Internal Medicine

## 2016-06-14 NOTE — Telephone Encounter (Signed)
New Message   Anderson Malta from [facility] call requesting to speak with RN about pt post hospital. Please call back to discuss

## 2016-06-15 ENCOUNTER — Ambulatory Visit (INDEPENDENT_AMBULATORY_CARE_PROVIDER_SITE_OTHER): Payer: Medicare Other | Admitting: *Deleted

## 2016-06-15 DIAGNOSIS — I82621 Acute embolism and thrombosis of deep veins of right upper extremity: Secondary | ICD-10-CM | POA: Diagnosis not present

## 2016-06-15 DIAGNOSIS — Z5181 Encounter for therapeutic drug level monitoring: Secondary | ICD-10-CM | POA: Diagnosis not present

## 2016-06-15 LAB — POCT INR: INR: 2.5

## 2016-06-15 LAB — CULTURE, BLOOD (SINGLE)
Organism ID, Bacteria: NO GROWTH
Organism ID, Bacteria: NO GROWTH

## 2016-06-18 ENCOUNTER — Encounter: Payer: Self-pay | Admitting: Interventional Cardiology

## 2016-06-18 ENCOUNTER — Ambulatory Visit (INDEPENDENT_AMBULATORY_CARE_PROVIDER_SITE_OTHER): Payer: Medicare Other | Admitting: Interventional Cardiology

## 2016-06-18 VITALS — BP 116/60 | HR 60

## 2016-06-18 DIAGNOSIS — I482 Chronic atrial fibrillation, unspecified: Secondary | ICD-10-CM

## 2016-06-18 DIAGNOSIS — I5032 Chronic diastolic (congestive) heart failure: Secondary | ICD-10-CM

## 2016-06-18 DIAGNOSIS — I25810 Atherosclerosis of coronary artery bypass graft(s) without angina pectoris: Secondary | ICD-10-CM

## 2016-06-18 DIAGNOSIS — I480 Paroxysmal atrial fibrillation: Secondary | ICD-10-CM

## 2016-06-18 DIAGNOSIS — Z950 Presence of cardiac pacemaker: Secondary | ICD-10-CM | POA: Diagnosis not present

## 2016-06-18 DIAGNOSIS — I739 Peripheral vascular disease, unspecified: Secondary | ICD-10-CM

## 2016-06-18 DIAGNOSIS — I079 Rheumatic tricuspid valve disease, unspecified: Secondary | ICD-10-CM

## 2016-06-18 DIAGNOSIS — Z79899 Other long term (current) drug therapy: Secondary | ICD-10-CM

## 2016-06-18 DIAGNOSIS — N184 Chronic kidney disease, stage 4 (severe): Secondary | ICD-10-CM | POA: Diagnosis not present

## 2016-06-18 DIAGNOSIS — K551 Chronic vascular disorders of intestine: Secondary | ICD-10-CM

## 2016-06-18 DIAGNOSIS — I368 Other nonrheumatic tricuspid valve disorders: Secondary | ICD-10-CM

## 2016-06-18 DIAGNOSIS — I10 Essential (primary) hypertension: Secondary | ICD-10-CM

## 2016-06-18 LAB — HEPATIC FUNCTION PANEL
ALK PHOS: 40 U/L (ref 25–125)
ALT: 8 U/L — AB (ref 10–40)
AST: 26 U/L (ref 14–40)
BILIRUBIN, TOTAL: 0.3 mg/dL

## 2016-06-18 LAB — BASIC METABOLIC PANEL
BUN: 49 mg/dL — AB (ref 4–21)
Creatinine: 3.1 mg/dL — AB (ref 0.6–1.3)
Glucose: 141 mg/dL
Potassium: 5.2 mmol/L (ref 3.4–5.3)
SODIUM: 133 mmol/L — AB (ref 137–147)

## 2016-06-18 LAB — CBC AND DIFFERENTIAL
HEMATOCRIT: 27 % — AB (ref 41–53)
HEMOGLOBIN: 8.6 g/dL — AB (ref 13.5–17.5)
Platelets: 193 10*3/uL (ref 150–399)
WBC: 6.7 10^3/mL

## 2016-06-18 NOTE — Patient Instructions (Addendum)
Medication Instructions:  None  Labwork: CMET, CBC, BNP and TSH today  Testing/Procedures: None  Follow-Up: Your physician recommends that you schedule a follow-up appointment in: 1 month with Dr. Tamala Julian (Can have 9:45AM or 10:15AM on 08/03/16)  Any Other Special Instructions Will Be Listed Below (If Applicable).     If you need a refill on your cardiac medications before your next appointment, please call your pharmacy.

## 2016-06-18 NOTE — Progress Notes (Signed)
Cardiology Office Note    Date:  06/18/2016   ID:  [name] Marble Sr., DOB March 02, 1936, MRN [id number]  PCP:  [name], MD  Cardiologist: Sinclair Grooms, MD   Chief Complaint  Patient presents with  . Atrial Fibrillation    History of Present Illness:  [name] Lodes. is a 80 y.o. male hx of CAD status post CABG 1994 with subsequent stents and repeat CABG in 2013, hypertension, HLD, CKD, chronic diastolic heart failure, sinus bradycardia sp PPM (SJM) by Dr Leonia Reeves. Has PAF, CHADSVASC=5 but is not anticoagulated for atrial fibrillation due to prior GI bleed. He is on aspirin and Plavix. Saw Dr. Rayann Heman in 10/2015 to discuss watchman. Because he had no atrial fibrillation for the past 6 months conservative management was recommended. Amiodarone was continued.Recently had C. difficile colitis, bacteremia, endocarditis with lead infection. Pacer lead explantation was required. He is now on Coumadin therapy.  He has been through a lot in the hospital. He has had his pacemaker explanted because of sepsis. He had endocarditis. He has chronic stage IV-stage V chronic kidney disease. As noted above, he has several rounds of antibiotics and is now doing somewhat better. There is no plan to re-implanted pacemaker.  He is now on 1/6 of his previous diuretic regimen. He has significant lower extremity swelling.   Past Medical History:  Diagnosis Date  . Angiomyolipoma 2009   On both kidneys noted in 2009  . Arthritis    neck and left wrist  . Atrial fibrillation ([place])   . CAD (coronary artery disease)    s/b CABG 1994, and subsequent stents. Repeat CABG 12/2011,  . Chronic diastolic heart failure ([place])   . CKD (chronic kidney disease)   . Clostridium difficile diarrhea 03/2016  . Depressive disorder   . Diabetes mellitus type 2 with peripheral artery disease (HCC)    DIET CONTROLLED  . DVT (deep venous thrombosis) ([place]) 2011   Right arm  . Gastroparesis   . GERD  (gastroesophageal reflux disease)   . Gout   . History of hiatal hernia   . Hyperlipidemia   . Hypertension   . Internal hemorrhoids without mention of complication   . Ischemic colitis ([place])   . 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 Myelodysplastic syndrome ([place]) 05/22/2013   With low hemoglobin and platelets treated with Procrit  . Osteopenia   . Peptic ulcer    S/p partial gastrectomy in 1969  . Peripheral artery disease ([place])   . Pneumonia 01/16/2016  . Pneumonia 03/2016  . Presence of permanent cardiac pacemaker   . Prostate cancer ([place]) 1997   XRT and lupron  . Renal artery stenosis ([place])   . Sick sinus syndrome ([place])   . Vitamin B 12 deficiency     Past Surgical History:  Procedure Laterality Date  . ABDOMINAL ANGIOGRAM N/A 05/25/2011   Procedure: ABDOMINAL ANGIOGRAM;  Surgeon: Serafina Mitchell, MD;  Location: [facility] CATH LAB;  Service: Cardiovascular;  Laterality: N/A;  . ABDOMINAL ANGIOGRAM N/A 02/13/2013   Procedure: ABDOMINAL ANGIOGRAM;  Surgeon: Serafina Mitchell, MD;  Location: [facility] CATH LAB;  Service: Cardiovascular;  Laterality: N/A;  . APPENDECTOMY  1991  . CELIAC ARTERY ANGIOPLASTY  05-16-12   and stenting  . CHOLECYSTECTOMY  Oct 2009   Laparoscopic  . CORONARY ARTERY BYPASS GRAFT  01/22/1993  . CORONARY ARTERY BYPASS GRAFT  01/03/2012   Procedure: REDO CORONARY ARTERY BYPASS GRAFTING (CABG);  Surgeon: Gaye Pollack, MD;  Location: [facility] OR;  Service: Open Heart Surgery;  Laterality: N/A;  Redo CABG x  using bilateral internal mammary arteries;  left leg greater saphenous vein harvested endoscopically  . FOREIGN BODY REMOVAL ABDOMINAL Right 01/27/2016   Procedure: EXPOSURE OF RIGHT COMMON FEMORAL ARTERY AND REMOVAL FOREIGN;  Surgeon: Evans Lance, MD;  Location: [place];  Service: Cardiovascular;  Laterality: Right;  . HIATAL HERNIA REPAIR     and ulcer repair  . I&D EXTREMITY Left 01/21/2016   Procedure: IRRIGATION AND DEBRIDEMENT EXTREMITY;  Surgeon: Milly Jakob, MD;  Location: [place];  Service: Orthopedics;  Laterality: Left;  . INGUINAL HERNIA REPAIR Right 10/28/2015   Procedure: OPEN RIGHT INGUINAL HERNIA REPAIR;  Surgeon: Greer Pickerel, MD;  Location: [facility] ORS;  Service: General;  Laterality: Right;  . INSERTION OF MESH Right 10/28/2015   Procedure: INSERTION OF MESH;  Surgeon: Greer Pickerel, MD;  Location: [facility] ORS;  Service: General;  Laterality: Right;  . IR GENERIC HISTORICAL  02/02/2016   IR FLUORO GUIDE CV LINE LEFT 02/02/2016 Markus Daft, MD [facility]  . IR GENERIC HISTORICAL  02/02/2016   IR US GUIDE VASC ACCESS LEFT 02/02/2016 Markus Daft, MD [facility]  . IR GENERIC HISTORICAL  03/31/2016   IR REMOVAL TUN CV CATH W/O FL 03/31/2016 Arne Cleveland, MD [facility]  . LEFT HEART CATHETERIZATION WITH CORONARY/GRAFT ANGIOGRAM  12/24/2011   Procedure: LEFT HEART CATHETERIZATION WITH Beatrix Fetters;  Surgeon: Sinclair Grooms, MD;  Location: [facility] CATH LAB;  Service: Cardiovascular;;  . LOWER EXTREMITY ANGIOGRAM Bilateral 05/25/2011   Procedure: LOWER EXTREMITY ANGIOGRAM;  Surgeon: Serafina Mitchell, MD;  Location: [facility] CATH LAB;  Service: Cardiovascular;  Laterality: Bilateral;  . OTHER SURGICAL HISTORY  02/13/13   superior mesenteric artery angiogram  . OTHER SURGICAL HISTORY  05/16/12   Stent in stomach  . PACEMAKER GENERATOR CHANGE  12/10/2003   SJM Identity XL DR performed by Dr Leonia Reeves  . PACEMAKER INSERTION  10/18/1994   DDD pacemaker, [employer]. Gen change 12/10/2003.  Marland Kitchen PACEMAKER LEAD REMOVAL Right 01/27/2016   Procedure: PACEMAKER EXTRACTION;  Surgeon: Evans Lance, MD;  Location: [place];  Service: Cardiovascular;  Laterality: Right;  DR. Roxy Manns TO BACKUP CASE  . PARTIAL GASTRECTOMY  1969   Hx of ulcer s/p partial gastrectomy/ has pernicious anemia  . RENAL ANGIOGRAM N/A 02/13/2013   Procedure: RENAL ANGIOGRAM;  Surgeon: Serafina Mitchell, MD;  Location: [facility] CATH LAB;  Service: Cardiovascular;  Laterality: N/A;  . TEE WITHOUT CARDIOVERSION N/A 01/26/2016   Procedure:  TRANSESOPHAGEAL ECHOCARDIOGRAM (TEE);  Surgeon: Sueanne Margarita, MD;  Location: [facility] ENDOSCOPY;  Service: Cardiovascular;  Laterality: N/A;  . TEE WITHOUT CARDIOVERSION N/A 01/27/2016   Procedure: TRANSESOPHAGEAL ECHOCARDIOGRAM (TEE);  Surgeon: Evans Lance, MD;  Location: [place];  Service: Cardiovascular;  Laterality: N/A;  . TEE WITHOUT CARDIOVERSION N/A 04/12/2016   Procedure: TRANSESOPHAGEAL ECHOCARDIOGRAM (TEE);  Surgeon: Dorothy Spark, MD;  Location: [place];  Service: Cardiovascular;  Laterality: N/A;  . THROMBECTOMY / EMBOLECTOMY SUBCLAVIAN ARTERY  02/02/10   Right subclavian thromboectomy and venous angioplasty, and chronic mesenteric ischemia with Herculink stenting to superior mesenteric and celiac arteries - Dr. Trula Slade  . TRANSURETHRAL RESECTION OF PROSTATE N/A 02/22/2016   Procedure: CYSTO, CLOT EVACUATION, FULGERATION;  Surgeon: Alexis Frock, MD;  Location: [facility];  Service: Urology;  Laterality: N/A;  . VISCERAL ANGIOGRAM N/A 05/25/2011   Procedure: VISCERAL ANGIOGRAM;  Surgeon: Serafina Mitchell, MD;  Location: [facility] CATH  LAB;  Service: Cardiovascular;  Laterality: N/A;  . VISCERAL ANGIOGRAM Bilateral 12/28/2011   Procedure: VISCERAL ANGIOGRAM;  Surgeon: Serafina Mitchell, MD;  Location: [facility] CATH LAB;  Service: Cardiovascular;  Laterality: Bilateral;  . VISCERAL ANGIOGRAM N/A 05/16/2012   Procedure: VISCERAL ANGIOGRAM;  Surgeon: Serafina Mitchell, MD;  Location: [facility] CATH LAB;  Service: Cardiovascular;  Laterality: N/A;  . VISCERAL ANGIOGRAM N/A 02/13/2013   Procedure: VISCERAL ANGIOGRAM;  Surgeon: Serafina Mitchell, MD;  Location: [facility] CATH LAB;  Service: Cardiovascular;  Laterality: N/A;    Current Medications: Outpatient Medications Prior to Visit  Medication Sig Dispense Refill  . acetaminophen (ARTHRITIS PAIN RELIEF) 650 MG CR tablet Take 650 mg by mouth 2 (two) times daily.     Marland Kitchen amiodarone (PACERONE) 200 MG tablet Take one-half tablet by  mouth daily 45 tablet 2  . atorvastatin  (LIPITOR) 80 MG tablet Take 80 mg by mouth at bedtime.     Marland Kitchen CALCIUM PO Take 1 tablet by mouth daily.    . carvedilol (COREG) 25 MG tablet Take 1 tablet (25 mg total) by mouth 2 (two) times daily with a meal. 60 tablet 0  . cloNIDine (CATAPRES) 0.1 MG tablet Take 1 tablet (0.1 mg total) by mouth 3 (three) times daily. 60 tablet 11  . cyanocobalamin (,VITAMIN B-12,) 1000 MCG/ML injection Inject 1,000 mcg into the muscle every 30 (thirty) days.     Marland Kitchen epoetin alfa (EPOGEN,PROCRIT) [address] UNIT/ML injection Inject 10,000 Units into the skin every 14 (fourteen) days. Done at [facility] - last injection 09/22/15    . ferrous sulfate 325 (65 FE) MG tablet Take 1 tablet (325 mg total) by mouth 3 (three) times daily with meals.  3  . hydrALAZINE (APRESOLINE) 25 MG tablet Take 3 tablets (75 mg total) by mouth 4 (four) times daily.    . isosorbide mononitrate (IMDUR) 120 MG 24 hr tablet Take 1 tablet by mouth  daily 90 tablet 2  . leuprolide (LUPRON) 30 MG injection Inject 30 mg into the muscle every 4 (four) months.    . levothyroxine (SYNTHROID, LEVOTHROID) 25 MCG tablet Take 25 mcg by mouth daily before breakfast.    . Multiple Vitamin (MULTIVITAMIN WITH MINERALS) TABS tablet Take 1 tablet by mouth every morning.    . Nutritional Supplements (FEEDING SUPPLEMENT, GLUCERNA 1.2 CAL,) LIQD Take 237 mLs by mouth 2 (two) times daily as needed (nutritional supplement).    Lou Cal powder Apply 1 application topically 2 (two) times daily.    . potassium chloride SA (K-DUR,KLOR-CON) 20 MEQ tablet Take 1 tablet by mouth twice a day on the days you take your Lasix (every other day) 60 tablet 1  . silodosin (RAPAFLO) 8 MG CAPS capsule Take 8 mg by mouth at bedtime.     Marland Kitchen warfarin (COUMADIN) 6 MG tablet Take 1 tablet to 1 1/2 tablets daily or as directed by [facility]. 40 tablet 1  . furosemide (LASIX) 80 MG tablet Take 1 tablet (80 mg total) by mouth every other day. Take 1 tablet by mouth as needed for fluid  retention and edema (Patient not taking: Reported on 06/18/2016) 15 tablet 3  . senna-docusate (SENOKOT-S) 8.6-50 MG tablet Take 2 tablets by mouth 2 (two) times daily. (Patient not taking: Reported on 06/18/2016)    . sodium bicarbonate 650 MG tablet Take 1 tablet (650 mg total) by mouth 2 (two) times daily. (Patient not taking: Reported on 06/18/2016)    . vancomycin (VANCOCIN) 50  mg/mL oral solution Take 2.5 mLs (125 mg total) by mouth 4 (four) times daily. (Patient not taking: Reported on 06/18/2016) 110 mL 0   No facility-administered medications prior to visit.      Allergies:   Nsaids; Ibuprofen; and Ace inhibitors   Social History   Social History  . Marital status: Married    Spouse name: N/A  . Number of children: N/A  . Years of education: N/A   Social History Main Topics  . Smoking status: Former Smoker    Years: 20.00    Types: Cigarettes    Quit date: 07/12/1969  . Smokeless tobacco: Never Used  . Alcohol use No  . Drug use: No  . Sexual activity: Yes   Other Topics Concern  . None   Social History Narrative  . None     Family History:  The patient's family history includes CAD in his brother; CVA (age of onset: 42) in his mother; Cancer in his father, paternal uncle, and sister; Diabetes in his mother and sister; Heart attack in his daughter; Heart disease in his brother, daughter, mother, and sister; Hyperlipidemia in his mother; Hypertension in his daughter, father, mother, and sister.   ROS:   Please see the history of present illness.    He feels weak. Having difficulty sleeping.  All other systems reviewed and are negative.   PHYSICAL EXAM:   VS:  BP 116/60 (BP Location: Left Arm)   Pulse 60    GEN: Well nourished, well developed, in no acute distress  HEENT: normal  Neck: no JVD, carotid bruits, or masses Cardiac: RRR;There is an apical systolic murmur of regurgitation. No rubs, or gallop. Massive bilateral lower extremity edema from ankles to that  knees bilateral.  Respiratory:  clear to auscultation bilaterally, normal work of breathing GI: soft, nontender, nondistended, + BS MS: no deformity or atrophy  Skin: warm and dry, no rash Neuro:  Alert and Oriented x 3, Strength and sensation are intact Psych: euthymic mood, full affect  Wt Readings from Last 3 Encounters:  06/07/16 172 lb 12.8 oz (78.4 kg)  06/03/16 183 lb 3.2 oz (83.1 kg)  05/07/16 162 lb 1.9 oz (73.5 kg)      Studies/Labs Reviewed:   EKG:  EKG  Repeated  Recent Labs: 02/02/2016: Magnesium 1.8 04/09/2016: ALT 8 05/29/2016: TSH 33.438 06/03/2016: BUN 59; Creatinine, Ser 2.45; Hemoglobin 8.5; Platelets 161; Potassium 4.6; Sodium 136   Lipid Panel    Component Value Date/Time   CHOL 122 03/30/2016   TRIG 67 03/30/2016   HDL 51 03/30/2016   CHOLHDL 1.9 12/25/2011 0416   VLDL 8 12/25/2011 0416   LDLCALC 58 03/30/2016    Additional studies/ records that were reviewed today include:  Reviewed data from the recent hospital stays.    ASSESSMENT:    1. On amiodarone therapy   2. History of pacemaker   3. CKD (chronic kidney disease), stage IV ([place])   4. Chronic atrial fibrillation (HCC)   5. Chronic vascular insufficiency of intestine (HCC)   6. Chronic diastolic heart failure ([place])   7. CAD of autologous bypass graft   8. PAF (paroxysmal atrial fibrillation) ([place])   9. PAD (peripheral artery disease) ([place])   10. Essential hypertension   11. Endocarditis of tricuspid valve      PLAN:  In order of problems listed above:  1. Continue amiodarone at the current dose. TSH was 18 and November. He is on low dose Synthroid. 2. Status post  explantation. 3. Cmet. CBC,  and BNP will be obtained today. 4. Continue amiodarone. 5. Out of dressed 6. Volume overload with bilateral lower extremity swelling. 7. He denies angina pectoris. 8. Amiodarone therapy will be continued to suppress recurrent atrial fibrillation. Anticoagulation is being used to prevent  stroke. 9. PAD is not addressed on today's visit 10. Blood pressures well controlled. 11. No systemic complaints to suggest recurrent bacteremia.    Medication Adjustments/Labs and Tests Ordered: Current medicines are reviewed at length with the patient today.  Concerns regarding medicines are outlined above.  Medication changes, Labs and Tests ordered today are listed in the Patient Instructions below. Patient Instructions  Medication Instructions:  None  Labwork: CMET, CBC, BNP and TSH today  Testing/Procedures: None  Follow-Up: Your physician recommends that you schedule a follow-up appointment in: 1 month with Dr. Tamala Julian (Can have 9:45AM or 10:15AM on 08/03/16)  Any Other Special Instructions Will Be Listed Below (If Applicable).     If you need a refill on your cardiac medications before your next appointment, please call your pharmacy.      Signed, Sinclair Grooms, MD  06/18/2016 4:44 PM    [facility] [place], [place], [place]  [address] Phone: [phone]; Fax: [phone]

## 2016-06-19 LAB — COMPREHENSIVE METABOLIC PANEL
ALK PHOS: 40 U/L (ref 40–115)
ALT: 8 U/L — AB (ref 9–46)
AST: 26 U/L (ref 10–35)
Albumin: 2.6 g/dL — ABNORMAL LOW (ref 3.6–5.1)
BUN: 49 mg/dL — ABNORMAL HIGH (ref 7–25)
CALCIUM: 8 mg/dL — AB (ref 8.6–10.3)
CO2: 17 mmol/L — ABNORMAL LOW (ref 20–31)
Chloride: 106 mmol/L (ref 98–110)
Creat: 3.05 mg/dL — ABNORMAL HIGH (ref 0.70–1.18)
GLUCOSE: 141 mg/dL — AB (ref 65–99)
POTASSIUM: 5.2 mmol/L (ref 3.5–5.3)
Sodium: 133 mmol/L — ABNORMAL LOW (ref 135–146)
Total Bilirubin: 0.3 mg/dL (ref 0.2–1.2)
Total Protein: 6.5 g/dL (ref 6.1–8.1)

## 2016-06-19 LAB — CBC
HCT: 26.6 % — ABNORMAL LOW (ref 38.5–50.0)
HEMOGLOBIN: 8.6 g/dL — AB (ref 13.2–17.1)
MCH: 30.1 pg (ref 27.0–33.0)
MCHC: 32.3 g/dL (ref 32.0–36.0)
MCV: 93 fL (ref 80.0–100.0)
MPV: 10.2 fL (ref 7.5–12.5)
Platelets: 193 10*3/uL (ref 140–400)
RBC: 2.86 MIL/uL — AB (ref 4.20–5.80)
RDW: 19.5 % — ABNORMAL HIGH (ref 11.0–15.0)
WBC: 6.7 10*3/uL (ref 3.8–10.8)

## 2016-06-19 LAB — TSH: TSH: 45.78 m[IU]/L — AB (ref 0.40–4.50)

## 2016-06-19 LAB — BRAIN NATRIURETIC PEPTIDE: BRAIN NATRIURETIC PEPTIDE: 891.5 pg/mL — AB (ref <100)

## 2016-06-21 ENCOUNTER — Ambulatory Visit (HOSPITAL_BASED_OUTPATIENT_CLINIC_OR_DEPARTMENT_OTHER): Payer: Medicare Other

## 2016-06-21 ENCOUNTER — Other Ambulatory Visit (HOSPITAL_BASED_OUTPATIENT_CLINIC_OR_DEPARTMENT_OTHER): Payer: Medicare Other

## 2016-06-21 VITALS — BP 152/75 | HR 59 | Temp 97.7°F | Resp 18

## 2016-06-21 DIAGNOSIS — N183 Chronic kidney disease, stage 3 (moderate): Secondary | ICD-10-CM

## 2016-06-21 DIAGNOSIS — D469 Myelodysplastic syndrome, unspecified: Secondary | ICD-10-CM

## 2016-06-21 LAB — CBC & DIFF AND RETIC
BASO%: 0.2 % (ref 0.0–2.0)
BASOS ABS: 0 10*3/uL (ref 0.0–0.1)
EOS%: 0.8 % (ref 0.0–7.0)
Eosinophils Absolute: 0 10*3/uL (ref 0.0–0.5)
HCT: 29 % — ABNORMAL LOW (ref 38.4–49.9)
HGB: 9.1 g/dL — ABNORMAL LOW (ref 13.0–17.1)
IMMATURE RETIC FRACT: 3.2 % (ref 3.00–10.60)
LYMPH#: 1 10*3/uL (ref 0.9–3.3)
LYMPH%: 19.1 % (ref 14.0–49.0)
MCH: 29.1 pg (ref 27.2–33.4)
MCHC: 31.4 g/dL — ABNORMAL LOW (ref 32.0–36.0)
MCV: 92.7 fL (ref 79.3–98.0)
MONO#: 0.4 10*3/uL (ref 0.1–0.9)
MONO%: 7.8 % (ref 0.0–14.0)
NEUT#: 3.6 10*3/uL (ref 1.5–6.5)
NEUT%: 72.1 % (ref 39.0–75.0)
PLATELETS: 184 10*3/uL (ref 140–400)
RBC: 3.13 10*6/uL — AB (ref 4.20–5.82)
RDW: 18.8 % — ABNORMAL HIGH (ref 11.0–14.6)
RETIC CT ABS: 20.97 10*3/uL — AB (ref 34.80–93.90)
Retic %: 0.67 % — ABNORMAL LOW (ref 0.80–1.80)
WBC: 5 10*3/uL (ref 4.0–10.3)

## 2016-06-21 LAB — CBC AND DIFFERENTIAL
HCT: 29 % — AB (ref 41–53)
Hemoglobin: 9.1 g/dL — AB (ref 13.5–17.5)
Platelets: 184 10*3/uL (ref 150–399)
WBC: 5 10^3/mL

## 2016-06-21 MED ORDER — DARBEPOETIN ALFA 200 MCG/0.4ML IJ SOSY
200.0000 ug | PREFILLED_SYRINGE | Freq: Once | INTRAMUSCULAR | Status: AC
  Administered 2016-06-21: 200 ug via SUBCUTANEOUS
  Filled 2016-06-21: qty 0.4

## 2016-06-21 NOTE — Patient Instructions (Signed)
Darbepoetin Alfa injection What is this medicine? DARBEPOETIN ALFA (dar be POE e tin AL fa) helps your body make more red blood cells. It is used to treat anemia caused by chronic kidney failure and chemotherapy. This medicine may be used for other purposes; ask your health care provider or pharmacist if you have questions. What should I tell my health care provider before I take this medicine? They need to know if you have any of these conditions: -blood clotting disorders or history of blood clots -cancer patient not on chemotherapy -cystic fibrosis -heart disease, such as angina, heart failure, or a history of a heart attack -hemoglobin level of 12 g/dL or greater -high blood pressure -low levels of folate, iron, or vitamin B12 -seizures -an unusual or allergic reaction to darbepoetin, erythropoietin, albumin, hamster proteins, latex, other medicines, foods, dyes, or preservatives -pregnant or trying to get pregnant -breast-feeding How should I use this medicine? This medicine is for injection into a vein or under the skin. It is usually given by a health care professional in a hospital or clinic setting. If you get this medicine at home, you will be taught how to prepare and give this medicine. Do not shake the solution before you withdraw a dose. Use exactly as directed. Take your medicine at regular intervals. Do not take your medicine more often than directed. It is important that you put your used needles and syringes in a special sharps container. Do not put them in a trash can. If you do not have a sharps container, call your pharmacist or healthcare provider to get one. Talk to your pediatrician regarding the use of this medicine in children. While this medicine may be used in children as young as 1 year for selected conditions, precautions do apply. Overdosage: If you think you have taken too much of this medicine contact a poison control center or emergency room at once. NOTE:  This medicine is only for you. Do not share this medicine with others. What if I miss a dose? If you miss a dose, take it as soon as you can. If it is almost time for your next dose, take only that dose. Do not take double or extra doses. What may interact with this medicine? Do not take this medicine with any of the following medications: -epoetin alfa This list may not describe all possible interactions. Give your health care provider a list of all the medicines, herbs, non-prescription drugs, or dietary supplements you use. Also tell them if you smoke, drink alcohol, or use illegal drugs. Some items may interact with your medicine. What should I watch for while using this medicine? Visit your prescriber or health care professional for regular checks on your progress and for the needed blood tests and blood pressure measurements. It is especially important for the doctor to make sure your hemoglobin level is in the desired range, to limit the risk of potential side effects and to give you the best benefit. Keep all appointments for any recommended tests. Check your blood pressure as directed. Ask your doctor what your blood pressure should be and when you should contact him or her. As your body makes more red blood cells, you may need to take iron, folic acid, or vitamin B supplements. Ask your doctor or health care provider which products are right for you. If you have kidney disease continue dietary restrictions, even though this medication can make you feel better. Talk with your doctor or health care professional about the   foods you eat and the vitamins that you take. What side effects may I notice from receiving this medicine? Side effects that you should report to your doctor or health care professional as soon as possible: -allergic reactions like skin rash, itching or hives, swelling of the face, lips, or tongue -breathing problems -changes in vision -chest pain -confusion, trouble speaking  or understanding -feeling faint or lightheaded, falls -high blood pressure -muscle aches or pains -pain, swelling, warmth in the leg -rapid weight gain -severe headaches -sudden numbness or weakness of the face, arm or leg -trouble walking, dizziness, loss of balance or coordination -seizures (convulsions) -swelling of the ankles, feet, hands -unusually weak or tired Side effects that usually do not require medical attention (report to your doctor or health care professional if they continue or are bothersome): -diarrhea -fever, chills (flu-like symptoms) -headaches -nausea, vomiting -redness, stinging, or swelling at site where injected This list may not describe all possible side effects. Call your doctor for medical advice about side effects. You may report side effects to FDA at 1-800-FDA-1088. Where should I keep my medicine? Keep out of the reach of children. Store in a refrigerator between 2 and 8 degrees C (36 and 46 degrees F). Do not freeze. Do not shake. Throw away any unused portion if using a single-dose vial. Throw away any unused medicine after the expiration date. NOTE: This sheet is a summary. It may not cover all possible information. If you have questions about this medicine, talk to your doctor, pharmacist, or health care provider.    2016, Elsevier/Gold Standard. (2008-06-11 10:23:57)  

## 2016-06-22 ENCOUNTER — Telehealth: Payer: Self-pay | Admitting: *Deleted

## 2016-06-22 ENCOUNTER — Ambulatory Visit (INDEPENDENT_AMBULATORY_CARE_PROVIDER_SITE_OTHER): Payer: Medicare Other | Admitting: Cardiology

## 2016-06-22 DIAGNOSIS — Z5181 Encounter for therapeutic drug level monitoring: Secondary | ICD-10-CM

## 2016-06-22 DIAGNOSIS — I480 Paroxysmal atrial fibrillation: Secondary | ICD-10-CM

## 2016-06-22 DIAGNOSIS — R7989 Other specified abnormal findings of blood chemistry: Secondary | ICD-10-CM

## 2016-06-22 LAB — POCT INR
INR: 3.7
INR: 3.7 — AB (ref 0.9–1.1)

## 2016-06-22 MED ORDER — LEVOTHYROXINE SODIUM 50 MCG PO TABS: 50.0000 ug | ORAL_TABLET | Freq: Every day | ORAL | 3 refills | Status: AC

## 2016-06-22 NOTE — Telephone Encounter (Signed)
-----   Message from Belva Crome, MD sent at 06/20/2016  8:34 PM EST ----- Let the patient know the BNP is elevated suggesting heart is also playing a role in swelling. Continue furosemide at 40 mg BID. Recheck BMET 4 days. A copy will be sent to [name], MD

## 2016-06-22 NOTE — Telephone Encounter (Signed)
Spoke with pt and wife and informed them of lab results and new medication instructions.  Pt agreeable to come in Friday for repeat labs.  Pt and wife verbalized understanding and was in agreement with this plan.

## 2016-06-24 ENCOUNTER — Encounter (HOSPITAL_COMMUNITY): Payer: Self-pay | Admitting: Emergency Medicine

## 2016-06-24 ENCOUNTER — Inpatient Hospital Stay (HOSPITAL_COMMUNITY)
Admission: EM | Admit: 2016-06-24 | Discharge: 2016-07-01 | DRG: 304 | Disposition: A | Discharge status: Skilled Nursing Facility | Payer: Medicare Other | Attending: Family Medicine | Admitting: Family Medicine

## 2016-06-24 ENCOUNTER — Other Ambulatory Visit (HOSPITAL_COMMUNITY): Payer: Self-pay | Admitting: Radiology

## 2016-06-24 ENCOUNTER — Emergency Department (HOSPITAL_COMMUNITY): Payer: Medicare Other

## 2016-06-24 ENCOUNTER — Inpatient Hospital Stay (HOSPITAL_COMMUNITY): Payer: Medicare Other

## 2016-06-24 DIAGNOSIS — M8580 Other specified disorders of bone density and structure, unspecified site: Secondary | ICD-10-CM | POA: Diagnosis present

## 2016-06-24 DIAGNOSIS — Z8249 Family history of ischemic heart disease and other diseases of the circulatory system: Secondary | ICD-10-CM

## 2016-06-24 DIAGNOSIS — K219 Gastro-esophageal reflux disease without esophagitis: Secondary | ICD-10-CM | POA: Diagnosis present

## 2016-06-24 DIAGNOSIS — N179 Acute kidney failure, unspecified: Secondary | ICD-10-CM | POA: Diagnosis present

## 2016-06-24 DIAGNOSIS — I480 Paroxysmal atrial fibrillation: Secondary | ICD-10-CM | POA: Diagnosis present

## 2016-06-24 DIAGNOSIS — Z8546 Personal history of malignant neoplasm of prostate: Secondary | ICD-10-CM

## 2016-06-24 DIAGNOSIS — R531 Weakness: Secondary | ICD-10-CM | POA: Diagnosis not present

## 2016-06-24 DIAGNOSIS — D638 Anemia in other chronic diseases classified elsewhere: Secondary | ICD-10-CM | POA: Diagnosis present

## 2016-06-24 DIAGNOSIS — M545 Low back pain: Secondary | ICD-10-CM | POA: Diagnosis present

## 2016-06-24 DIAGNOSIS — K3184 Gastroparesis: Secondary | ICD-10-CM | POA: Diagnosis present

## 2016-06-24 DIAGNOSIS — Z87891 Personal history of nicotine dependence: Secondary | ICD-10-CM

## 2016-06-24 DIAGNOSIS — Z951 Presence of aortocoronary bypass graft: Secondary | ICD-10-CM

## 2016-06-24 DIAGNOSIS — L89152 Pressure ulcer of sacral region, stage 2: Secondary | ICD-10-CM | POA: Diagnosis present

## 2016-06-24 DIAGNOSIS — B965 Pseudomonas (aeruginosa) (mallei) (pseudomallei) as the cause of diseases classified elsewhere: Secondary | ICD-10-CM | POA: Diagnosis present

## 2016-06-24 DIAGNOSIS — E1151 Type 2 diabetes mellitus with diabetic peripheral angiopathy without gangrene: Secondary | ICD-10-CM | POA: Diagnosis present

## 2016-06-24 DIAGNOSIS — W01198A Fall on same level from slipping, tripping and stumbling with subsequent striking against other object, initial encounter: Secondary | ICD-10-CM | POA: Diagnosis present

## 2016-06-24 DIAGNOSIS — Z7901 Long term (current) use of anticoagulants: Secondary | ICD-10-CM

## 2016-06-24 DIAGNOSIS — I160 Hypertensive urgency: Principal | ICD-10-CM | POA: Diagnosis present

## 2016-06-24 DIAGNOSIS — I169 Hypertensive crisis, unspecified: Secondary | ICD-10-CM | POA: Diagnosis present

## 2016-06-24 DIAGNOSIS — K529 Noninfective gastroenteritis and colitis, unspecified: Secondary | ICD-10-CM | POA: Diagnosis present

## 2016-06-24 DIAGNOSIS — D469 Myelodysplastic syndrome, unspecified: Secondary | ICD-10-CM | POA: Diagnosis present

## 2016-06-24 DIAGNOSIS — F329 Major depressive disorder, single episode, unspecified: Secondary | ICD-10-CM | POA: Diagnosis present

## 2016-06-24 DIAGNOSIS — I2510 Atherosclerotic heart disease of native coronary artery without angina pectoris: Secondary | ICD-10-CM | POA: Diagnosis present

## 2016-06-24 DIAGNOSIS — I495 Sick sinus syndrome: Secondary | ICD-10-CM | POA: Diagnosis present

## 2016-06-24 DIAGNOSIS — I10 Essential (primary) hypertension: Secondary | ICD-10-CM | POA: Diagnosis not present

## 2016-06-24 DIAGNOSIS — I5033 Acute on chronic diastolic (congestive) heart failure: Secondary | ICD-10-CM | POA: Diagnosis present

## 2016-06-24 DIAGNOSIS — E1143 Type 2 diabetes mellitus with diabetic autonomic (poly)neuropathy: Secondary | ICD-10-CM | POA: Diagnosis present

## 2016-06-24 DIAGNOSIS — M1990 Unspecified osteoarthritis, unspecified site: Secondary | ICD-10-CM | POA: Diagnosis present

## 2016-06-24 DIAGNOSIS — E785 Hyperlipidemia, unspecified: Secondary | ICD-10-CM | POA: Diagnosis present

## 2016-06-24 DIAGNOSIS — I82621 Acute embolism and thrombosis of deep veins of right upper extremity: Secondary | ICD-10-CM | POA: Diagnosis present

## 2016-06-24 DIAGNOSIS — Z86718 Personal history of other venous thrombosis and embolism: Secondary | ICD-10-CM

## 2016-06-24 DIAGNOSIS — N3000 Acute cystitis without hematuria: Secondary | ICD-10-CM

## 2016-06-24 DIAGNOSIS — W19XXXA Unspecified fall, initial encounter: Secondary | ICD-10-CM | POA: Diagnosis not present

## 2016-06-24 DIAGNOSIS — Z9861 Coronary angioplasty status: Secondary | ICD-10-CM

## 2016-06-24 DIAGNOSIS — Y92009 Unspecified place in unspecified non-institutional (private) residence as the place of occurrence of the external cause: Secondary | ICD-10-CM | POA: Diagnosis not present

## 2016-06-24 DIAGNOSIS — L8990 Pressure ulcer of unspecified site, unspecified stage: Secondary | ICD-10-CM | POA: Diagnosis present

## 2016-06-24 DIAGNOSIS — E039 Hypothyroidism, unspecified: Secondary | ICD-10-CM | POA: Diagnosis present

## 2016-06-24 DIAGNOSIS — N390 Urinary tract infection, site not specified: Secondary | ICD-10-CM | POA: Diagnosis present

## 2016-06-24 DIAGNOSIS — Z79899 Other long term (current) drug therapy: Secondary | ICD-10-CM

## 2016-06-24 DIAGNOSIS — N1330 Unspecified hydronephrosis: Secondary | ICD-10-CM | POA: Diagnosis present

## 2016-06-24 DIAGNOSIS — Z9049 Acquired absence of other specified parts of digestive tract: Secondary | ICD-10-CM

## 2016-06-24 DIAGNOSIS — R5381 Other malaise: Secondary | ICD-10-CM

## 2016-06-24 DIAGNOSIS — I130 Hypertensive heart and chronic kidney disease with heart failure and stage 1 through stage 4 chronic kidney disease, or unspecified chronic kidney disease: Secondary | ICD-10-CM | POA: Diagnosis present

## 2016-06-24 DIAGNOSIS — N178 Other acute kidney failure: Secondary | ICD-10-CM | POA: Diagnosis not present

## 2016-06-24 DIAGNOSIS — M549 Dorsalgia, unspecified: Secondary | ICD-10-CM

## 2016-06-24 DIAGNOSIS — M109 Gout, unspecified: Secondary | ICD-10-CM | POA: Diagnosis present

## 2016-06-24 DIAGNOSIS — R29898 Other symptoms and signs involving the musculoskeletal system: Secondary | ICD-10-CM

## 2016-06-24 DIAGNOSIS — M609 Myositis, unspecified: Secondary | ICD-10-CM | POA: Diagnosis present

## 2016-06-24 DIAGNOSIS — Z8711 Personal history of peptic ulcer disease: Secondary | ICD-10-CM

## 2016-06-24 DIAGNOSIS — A0472 Enterocolitis due to Clostridium difficile, not specified as recurrent: Secondary | ICD-10-CM | POA: Diagnosis present

## 2016-06-24 DIAGNOSIS — N184 Chronic kidney disease, stage 4 (severe): Secondary | ICD-10-CM | POA: Diagnosis present

## 2016-06-24 DIAGNOSIS — E538 Deficiency of other specified B group vitamins: Secondary | ICD-10-CM | POA: Diagnosis present

## 2016-06-24 DIAGNOSIS — N400 Enlarged prostate without lower urinary tract symptoms: Secondary | ICD-10-CM | POA: Diagnosis present

## 2016-06-24 DIAGNOSIS — Z6825 Body mass index (BMI) 25.0-25.9, adult: Secondary | ICD-10-CM

## 2016-06-24 LAB — URINALYSIS, ROUTINE W REFLEX MICROSCOPIC
Bilirubin Urine: NEGATIVE
GLUCOSE, UA: NEGATIVE mg/dL
KETONES UR: NEGATIVE mg/dL
Nitrite: POSITIVE — AB
PH: 5 (ref 5.0–8.0)
Protein, ur: 30 mg/dL — AB
SPECIFIC GRAVITY, URINE: 1.008 (ref 1.005–1.030)
SQUAMOUS EPITHELIAL / LPF: NONE SEEN

## 2016-06-24 LAB — BASIC METABOLIC PANEL
BUN: 51 mg/dL — AB (ref 4–21)
BUN: 53 mg/dL — AB (ref 4–21)
CREATININE: 2.7 mg/dL — AB (ref 0.6–1.3)
CREATININE: 2.8 mg/dL — AB (ref 0.6–1.3)
GLUCOSE: 130 mg/dL
Glucose: 131 mg/dL
POTASSIUM: 3.8 mmol/L (ref 3.4–5.3)
Potassium: 3.8 mmol/L (ref 3.4–5.3)
Sodium: 135 mmol/L — AB (ref 137–147)
Sodium: 138 mmol/L (ref 137–147)

## 2016-06-24 LAB — COMPREHENSIVE METABOLIC PANEL
ALK PHOS: 52 U/L (ref 38–126)
ALT: 9 U/L — ABNORMAL LOW (ref 17–63)
AST: 19 U/L (ref 15–41)
Albumin: 2.9 g/dL — ABNORMAL LOW (ref 3.5–5.0)
Anion gap: 9 (ref 5–15)
BUN: 53 mg/dL — AB (ref 6–20)
CALCIUM: 8.8 mg/dL — AB (ref 8.9–10.3)
CHLORIDE: 103 mmol/L (ref 101–111)
CO2: 23 mmol/L (ref 22–32)
Creatinine, Ser: 2.79 mg/dL — ABNORMAL HIGH (ref 0.61–1.24)
GFR, EST AFRICAN AMERICAN: 23 mL/min — AB (ref >60)
GFR, EST NON AFRICAN AMERICAN: 20 mL/min — AB (ref >60)
Glucose, Bld: 131 mg/dL — ABNORMAL HIGH (ref 65–99)
Potassium: 3.8 mmol/L (ref 3.5–5.1)
SODIUM: 135 mmol/L (ref 135–145)
Total Bilirubin: 0.5 mg/dL (ref 0.3–1.2)
Total Protein: 8.2 g/dL — ABNORMAL HIGH (ref 6.5–8.1)

## 2016-06-24 LAB — CBC WITH DIFFERENTIAL/PLATELET
BASOS PCT: 0 %
Basophils Absolute: 0 10*3/uL (ref 0.0–0.1)
EOS ABS: 0 10*3/uL (ref 0.0–0.7)
EOS PCT: 0 %
HCT: 32.4 % — ABNORMAL LOW (ref 39.0–52.0)
Hemoglobin: 10.8 g/dL — ABNORMAL LOW (ref 13.0–17.0)
Lymphocytes Relative: 11 %
Lymphs Abs: 1.3 10*3/uL (ref 0.7–4.0)
MCH: 29.9 pg (ref 26.0–34.0)
MCHC: 33.3 g/dL (ref 30.0–36.0)
MCV: 89.8 fL (ref 78.0–100.0)
MONOS PCT: 9 %
Monocytes Absolute: 1.1 10*3/uL — ABNORMAL HIGH (ref 0.1–1.0)
NEUTROS PCT: 80 %
Neutro Abs: 9.5 10*3/uL — ABNORMAL HIGH (ref 1.7–7.7)
PLATELETS: 221 10*3/uL (ref 150–400)
RBC: 3.61 MIL/uL — ABNORMAL LOW (ref 4.22–5.81)
RDW: 18.7 % — AB (ref 11.5–15.5)
WBC: 12 10*3/uL — ABNORMAL HIGH (ref 4.0–10.5)

## 2016-06-24 LAB — I-STAT CHEM 8, ED
BUN: 51 mg/dL — AB (ref 6–20)
CHLORIDE: 105 mmol/L (ref 101–111)
Calcium, Ion: 1.23 mmol/L (ref 1.15–1.40)
Creatinine, Ser: 2.7 mg/dL — ABNORMAL HIGH (ref 0.61–1.24)
Glucose, Bld: 130 mg/dL — ABNORMAL HIGH (ref 65–99)
HEMATOCRIT: 36 % — AB (ref 39.0–52.0)
Hemoglobin: 12.2 g/dL — ABNORMAL LOW (ref 13.0–17.0)
POTASSIUM: 3.8 mmol/L (ref 3.5–5.1)
SODIUM: 138 mmol/L (ref 135–145)
TCO2: 23 mmol/L (ref 0–100)

## 2016-06-24 LAB — CBC AND DIFFERENTIAL
HCT: 32 % — AB (ref 41–53)
HEMATOCRIT: 36 % — AB (ref 41–53)
Hemoglobin: 10.8 g/dL — AB (ref 13.5–17.5)
Hemoglobin: 12.2 g/dL — AB (ref 13.5–17.5)
PLATELETS: 221 10*3/uL (ref 150–399)
WBC: 12 10*3/mL

## 2016-06-24 LAB — MRSA PCR SCREENING: MRSA BY PCR: NEGATIVE

## 2016-06-24 LAB — BRAIN NATRIURETIC PEPTIDE: B NATRIURETIC PEPTIDE 5: 1575 pg/mL — AB (ref 0.0–100.0)

## 2016-06-24 LAB — HEPATIC FUNCTION PANEL: Bilirubin, Total: 0.5 mg/dL

## 2016-06-24 LAB — POCT INR: INR: 2.5 — AB (ref 0.9–1.1)

## 2016-06-24 LAB — PROTIME-INR
INR: 2.45
Prothrombin Time: 27.1 seconds — ABNORMAL HIGH (ref 11.4–15.2)

## 2016-06-24 LAB — MAGNESIUM: Magnesium: 1.9 mg/dL (ref 1.7–2.4)

## 2016-06-24 LAB — I-STAT CG4 LACTIC ACID, ED: LACTIC ACID, VENOUS: 0.78 mmol/L (ref 0.5–1.9)

## 2016-06-24 MED ORDER — CLONIDINE HCL 0.1 MG PO TABS
0.1000 mg | ORAL_TABLET | Freq: Three times a day (TID) | ORAL | Status: DC
  Administered 2016-06-24 – 2016-06-25 (×3): 0.1 mg via ORAL
  Filled 2016-06-24 (×4): qty 1

## 2016-06-24 MED ORDER — LEVOTHYROXINE SODIUM 50 MCG PO TABS
50.0000 ug | ORAL_TABLET | Freq: Every day | ORAL | Status: DC
  Administered 2016-06-25 – 2016-07-01 (×7): 50 ug via ORAL
  Filled 2016-06-24 (×7): qty 1

## 2016-06-24 MED ORDER — CLONIDINE HCL 0.1 MG PO TABS
0.1000 mg | ORAL_TABLET | Freq: Once | ORAL | Status: AC
  Administered 2016-06-24: 0.1 mg via ORAL
  Filled 2016-06-24: qty 1

## 2016-06-24 MED ORDER — SODIUM CHLORIDE 0.9% FLUSH
3.0000 mL | Freq: Two times a day (BID) | INTRAVENOUS | Status: DC
  Administered 2016-06-25 – 2016-06-30 (×4): 3 mL via INTRAVENOUS

## 2016-06-24 MED ORDER — ATORVASTATIN CALCIUM 40 MG PO TABS
80.0000 mg | ORAL_TABLET | Freq: Every day | ORAL | Status: DC
  Administered 2016-06-24 – 2016-06-30 (×7): 80 mg via ORAL
  Filled 2016-06-24 (×7): qty 2

## 2016-06-24 MED ORDER — DEXTROSE 5 % IV SOLN
1.0000 g | INTRAVENOUS | Status: DC
  Administered 2016-06-25 – 2016-06-26 (×2): 1 g via INTRAVENOUS
  Filled 2016-06-24 (×2): qty 1

## 2016-06-24 MED ORDER — ONDANSETRON HCL 4 MG/2ML IJ SOLN: 4.0000 mg | Freq: Four times a day (QID) | INTRAMUSCULAR | Status: DC | PRN

## 2016-06-24 MED ORDER — CARVEDILOL 25 MG PO TABS
25.0000 mg | ORAL_TABLET | Freq: Two times a day (BID) | ORAL | Status: DC
  Administered 2016-06-24 – 2016-07-01 (×14): 25 mg via ORAL
  Filled 2016-06-24 (×7): qty 1
  Filled 2016-06-24 (×2): qty 2
  Filled 2016-06-24 (×5): qty 1

## 2016-06-24 MED ORDER — HYDRALAZINE HCL 20 MG/ML IJ SOLN
5.0000 mg | Freq: Once | INTRAMUSCULAR | Status: AC
  Administered 2016-06-24: 5 mg via INTRAVENOUS
  Filled 2016-06-24: qty 1

## 2016-06-24 MED ORDER — ACETAMINOPHEN 325 MG PO TABS
650.0000 mg | ORAL_TABLET | ORAL | Status: DC | PRN
  Administered 2016-06-29: 650 mg via ORAL
  Filled 2016-06-24: qty 2

## 2016-06-24 MED ORDER — WARFARIN SODIUM 6 MG PO TABS
6.0000 mg | ORAL_TABLET | ORAL | Status: AC
  Administered 2016-06-24: 6 mg via ORAL
  Filled 2016-06-24: qty 1

## 2016-06-24 MED ORDER — HYDRALAZINE HCL 50 MG PO TABS
75.0000 mg | ORAL_TABLET | Freq: Four times a day (QID) | ORAL | Status: DC
  Administered 2016-06-24 – 2016-06-25 (×3): 75 mg via ORAL
  Filled 2016-06-24 (×4): qty 1

## 2016-06-24 MED ORDER — AMIODARONE HCL 100 MG PO TABS
100.0000 mg | ORAL_TABLET | Freq: Every day | ORAL | Status: DC
  Administered 2016-06-25 – 2016-07-01 (×7): 100 mg via ORAL
  Filled 2016-06-24 (×7): qty 1

## 2016-06-24 MED ORDER — SODIUM CHLORIDE 0.9 % IV SOLN: 250.0000 mL | INTRAVENOUS | Status: DC | PRN

## 2016-06-24 MED ORDER — ISOSORBIDE MONONITRATE ER 30 MG PO TB24
120.0000 mg | ORAL_TABLET | Freq: Every day | ORAL | Status: DC
  Administered 2016-06-25 – 2016-07-01 (×7): 120 mg via ORAL
  Filled 2016-06-24 (×4): qty 4
  Filled 2016-06-24: qty 2
  Filled 2016-06-24 (×2): qty 4

## 2016-06-24 MED ORDER — DEXTROSE 5 % IV SOLN
2.0000 g | Freq: Once | INTRAVENOUS | Status: AC
  Administered 2016-06-24: 2 g via INTRAVENOUS
  Filled 2016-06-24: qty 2

## 2016-06-24 MED ORDER — FUROSEMIDE 10 MG/ML IJ SOLN
80.0000 mg | Freq: Two times a day (BID) | INTRAMUSCULAR | Status: DC
  Administered 2016-06-25 – 2016-06-26 (×3): 80 mg via INTRAVENOUS
  Filled 2016-06-24 (×3): qty 8

## 2016-06-24 MED ORDER — HYDROMORPHONE HCL 2 MG/ML IJ SOLN
0.5000 mg | INTRAMUSCULAR | Status: DC | PRN
  Administered 2016-06-26 – 2016-06-28 (×5): 0.5 mg via INTRAVENOUS
  Filled 2016-06-24 (×5): qty 1

## 2016-06-24 MED ORDER — TAMSULOSIN HCL 0.4 MG PO CAPS
0.4000 mg | ORAL_CAPSULE | Freq: Every day | ORAL | Status: DC
  Administered 2016-06-25 – 2016-06-30 (×6): 0.4 mg via ORAL
  Filled 2016-06-24 (×6): qty 1

## 2016-06-24 MED ORDER — SODIUM CHLORIDE 0.9% FLUSH: 3.0000 mL | INTRAVENOUS | Status: DC | PRN

## 2016-06-24 MED ORDER — POTASSIUM CHLORIDE CRYS ER 20 MEQ PO TBCR
20.0000 meq | EXTENDED_RELEASE_TABLET | Freq: Two times a day (BID) | ORAL | Status: DC
  Administered 2016-06-24 – 2016-07-01 (×14): 20 meq via ORAL
  Filled 2016-06-24 (×15): qty 1

## 2016-06-24 MED ORDER — SODIUM CHLORIDE 0.9 % IV BOLUS (SEPSIS)
1000.0000 mL | Freq: Once | INTRAVENOUS | Status: AC
  Administered 2016-06-24: 1000 mL via INTRAVENOUS

## 2016-06-24 MED ORDER — FUROSEMIDE 10 MG/ML IJ SOLN
80.0000 mg | Freq: Once | INTRAMUSCULAR | Status: AC
  Administered 2016-06-24: 80 mg via INTRAVENOUS
  Filled 2016-06-24: qty 8

## 2016-06-24 MED ORDER — WARFARIN - PHARMACIST DOSING INPATIENT: Freq: Every day | Status: DC

## 2016-06-24 NOTE — ED Triage Notes (Addendum)
Per EMS, from home, c/o generalized weakness x3 days. Patient also reports fall on Monday. Does reports hitting his head but denies LOC. Patient c/o back pain after fall. Denies chest pain and SOB. Hx prostate cancer. A&Ox4.

## 2016-06-24 NOTE — H&P (Signed)
History and Physical    [name] [id number] DOB: [birth year] DOA: 06/24/2016  PCP: [name], MD   Patient coming from: Home  Chief Complaint: Fall on Monday, now debilitated, generalized weakness, pain his back  HPI: [name]. is a 80 y.o. gentleman with a complicated medical history including CAD S/P CABG then stents, PAF/sick sinus syndrome S/P PPM (PPM has been removed recently due to systemic infection; CHADS-Vasc 5; anticoagulation stopped due to bleeding complications), chronic diastolic heart failure, HTN, DM, CKD 4, MDS, and hypothyroidism who presents today accompanied by wife and daughter for evaluation of multiple complaints.  He has been very ill recently with an admission in October for TV endocarditis.  He was admitted in November for CHF, RUE DVT, and C diff colitis.  He also had a pseudomonas UTI at that time.  Anticoagulation has been resumed since November due to diagnosis of DVT.  He had been doing fairly well until he had a fall on Monday.  His legs "became weak and gave out".  There was no LOC.  Since the fall, he has had pain in his left shoulder, and right mid to lower back.  He has had intermittent headache with blurred vision  He denies dysuria.  He says that he is appetite has been good.  No nausea or vomiting.  No chest pain, but he has had DOE.  He has leg swelling.  He does not follow a fluid restriction.  His family is concerned because he has not been able to ambulate since the fall.  They have already started looking at SNF options.  ED Course: Given his fall with head trauma while on anticoagulation and leg weakness, MRI of the brain was ordered in the ED.  This study does not show acute intracranial process.  MRI of the lumbar spine was also ordered, but the study could not be done because the MRI machine malfunctioned in the middle of his studies.  This study has been deferred.  The patient has had persistently elevated blood  pressures, with systolic greater than [id number].   He had clonidine and IV hydralazine in th ED.  He is due for several home anti-hypertensives.  He is also complaining for pain in his right flank.  Hospitalist asked to admit.   WBC count 12.  Hgb 11.  BUN 53.  Cr 2.79 (baseline 2.4).  U/A shows positive nitrites, large leukocytes, TNTC RBC, TNTC WBC, and rare bacteria.  The patient has received IV cefepime.  Review of Systems: As per HPI otherwise 10 point review of systems negative.    Past Medical History:  Diagnosis Date  . Angiomyolipoma 2009   On both kidneys noted in 2009  . Arthritis    neck and left wrist  . Atrial fibrillation ([place])   . CAD (coronary artery disease)    s/b CABG 1994, and subsequent stents. Repeat CABG 12/2011,  . Chronic diastolic heart failure ([place])   . CKD (chronic kidney disease)   . Clostridium difficile diarrhea 03/2016  . Depressive disorder   . Diabetes mellitus type 2 with peripheral artery disease (HCC)    DIET CONTROLLED  . DVT (deep venous thrombosis) ([place]) 2011   Right arm  . Gastroparesis   . GERD (gastroesophageal reflux disease)   . Gout   . History of hiatal hernia   . Hyperlipidemia   . Hypertension   . Internal hemorrhoids without mention of complication   . Ischemic colitis ([place])   .  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 Myelodysplastic syndrome ([place]) 05/22/2013   With low hemoglobin and platelets treated with Procrit  . Osteopenia   . Peptic ulcer    S/p partial gastrectomy in 1969  . Peripheral artery disease ([place])   . Pneumonia 01/16/2016  . Pneumonia 03/2016  . Presence of permanent cardiac pacemaker   . Prostate cancer ([place]) 1997   XRT and lupron  . Renal artery stenosis ([place])   . Sick sinus syndrome ([place])   . Vitamin B 12 deficiency     Past Surgical History:  Procedure Laterality Date  . ABDOMINAL ANGIOGRAM N/A 05/25/2011   Procedure: ABDOMINAL ANGIOGRAM;  Surgeon: Serafina Mitchell, MD;  Location: [facility] CATH LAB;  Service:  Cardiovascular;  Laterality: N/A;  . ABDOMINAL ANGIOGRAM N/A 02/13/2013   Procedure: ABDOMINAL ANGIOGRAM;  Surgeon: Serafina Mitchell, MD;  Location: [facility] CATH LAB;  Service: Cardiovascular;  Laterality: N/A;  . APPENDECTOMY  1991  . CELIAC ARTERY ANGIOPLASTY  05-16-12   and stenting  . CHOLECYSTECTOMY  Oct 2009   Laparoscopic  . CORONARY ARTERY BYPASS GRAFT  01/22/1993  . CORONARY ARTERY BYPASS GRAFT  01/03/2012   Procedure: REDO CORONARY ARTERY BYPASS GRAFTING (CABG);  Surgeon: Gaye Pollack, MD;  Location: [place];  Service: Open Heart Surgery;  Laterality: N/A;  Redo CABG x  using bilateral internal mammary arteries;  left leg greater saphenous vein harvested endoscopically  . FOREIGN BODY REMOVAL ABDOMINAL Right 01/27/2016   Procedure: EXPOSURE OF RIGHT COMMON FEMORAL ARTERY AND REMOVAL FOREIGN;  Surgeon: Evans Lance, MD;  Location: [place];  Service: Cardiovascular;  Laterality: Right;  . HIATAL HERNIA REPAIR     and ulcer repair  . I&D EXTREMITY Left 01/21/2016   Procedure: IRRIGATION AND DEBRIDEMENT EXTREMITY;  Surgeon: Milly Jakob, MD;  Location: [place];  Service: Orthopedics;  Laterality: Left;  . INGUINAL HERNIA REPAIR Right 10/28/2015   Procedure: OPEN RIGHT INGUINAL HERNIA REPAIR;  Surgeon: Greer Pickerel, MD;  Location: [facility];  Service: General;  Laterality: Right;  . INSERTION OF MESH Right 10/28/2015   Procedure: INSERTION OF MESH;  Surgeon: Greer Pickerel, MD;  Location: [facility];  Service: General;  Laterality: Right;  . IR GENERIC HISTORICAL  02/02/2016   IR FLUORO GUIDE CV LINE LEFT 02/02/2016 Markus Daft, MD [facility]  . IR GENERIC HISTORICAL  02/02/2016   IR US GUIDE VASC ACCESS LEFT 02/02/2016 Markus Daft, MD [facility]  . IR GENERIC HISTORICAL  03/31/2016   IR REMOVAL TUN CV CATH W/O FL 03/31/2016 Arne Cleveland, MD [facility]  . LEFT HEART CATHETERIZATION WITH CORONARY/GRAFT ANGIOGRAM  12/24/2011   Procedure: LEFT HEART CATHETERIZATION WITH Beatrix Fetters;  Surgeon: Sinclair Grooms, MD;  Location: [facility] CATH LAB;  Service: Cardiovascular;;  . LOWER EXTREMITY ANGIOGRAM Bilateral 05/25/2011   Procedure: LOWER EXTREMITY ANGIOGRAM;  Surgeon: Serafina Mitchell, MD;  Location: [facility] CATH LAB;  Service: Cardiovascular;  Laterality: Bilateral;  . OTHER SURGICAL HISTORY  02/13/13   superior mesenteric artery angiogram  . OTHER SURGICAL HISTORY  05/16/12   Stent in stomach  . PACEMAKER GENERATOR CHANGE  12/10/2003   SJM Identity XL DR performed by Dr Leonia Reeves  . PACEMAKER INSERTION  10/18/1994   DDD pacemaker, [employer]. Gen change 12/10/2003.  Marland Kitchen PACEMAKER LEAD REMOVAL Right 01/27/2016   Procedure: PACEMAKER EXTRACTION;  Surgeon: Evans Lance, MD;  Location: [place];  Service: Cardiovascular;  Laterality: Right;  DR. Roxy Manns TO BACKUP CASE  .  PARTIAL GASTRECTOMY  1969   Hx of ulcer s/p partial gastrectomy/ has pernicious anemia  . RENAL ANGIOGRAM N/A 02/13/2013   Procedure: RENAL ANGIOGRAM;  Surgeon: Serafina Mitchell, MD;  Location: [facility] CATH LAB;  Service: Cardiovascular;  Laterality: N/A;  . TEE WITHOUT CARDIOVERSION N/A 01/26/2016   Procedure: TRANSESOPHAGEAL ECHOCARDIOGRAM (TEE);  Surgeon: Sueanne Margarita, MD;  Location: [facility] ENDOSCOPY;  Service: Cardiovascular;  Laterality: N/A;  . TEE WITHOUT CARDIOVERSION N/A 01/27/2016   Procedure: TRANSESOPHAGEAL ECHOCARDIOGRAM (TEE);  Surgeon: Evans Lance, MD;  Location: [place];  Service: Cardiovascular;  Laterality: N/A;  . TEE WITHOUT CARDIOVERSION N/A 04/12/2016   Procedure: TRANSESOPHAGEAL ECHOCARDIOGRAM (TEE);  Surgeon: Dorothy Spark, MD;  Location: [place];  Service: Cardiovascular;  Laterality: N/A;  . THROMBECTOMY / EMBOLECTOMY SUBCLAVIAN ARTERY  02/02/10   Right subclavian thromboectomy and venous angioplasty, and chronic mesenteric ischemia with Herculink stenting to superior mesenteric and celiac arteries - Dr. Trula Slade  . TRANSURETHRAL RESECTION OF PROSTATE N/A 02/22/2016   Procedure: CYSTO, CLOT EVACUATION, FULGERATION;  Surgeon:  Alexis Frock, MD;  Location: [facility];  Service: Urology;  Laterality: N/A;  . VISCERAL ANGIOGRAM N/A 05/25/2011   Procedure: VISCERAL ANGIOGRAM;  Surgeon: Serafina Mitchell, MD;  Location: [facility] CATH LAB;  Service: Cardiovascular;  Laterality: N/A;  . VISCERAL ANGIOGRAM Bilateral 12/28/2011   Procedure: VISCERAL ANGIOGRAM;  Surgeon: Serafina Mitchell, MD;  Location: [facility] CATH LAB;  Service: Cardiovascular;  Laterality: Bilateral;  . VISCERAL ANGIOGRAM N/A 05/16/2012   Procedure: VISCERAL ANGIOGRAM;  Surgeon: Serafina Mitchell, MD;  Location: [facility] CATH LAB;  Service: Cardiovascular;  Laterality: N/A;  . VISCERAL ANGIOGRAM N/A 02/13/2013   Procedure: VISCERAL ANGIOGRAM;  Surgeon: Serafina Mitchell, MD;  Location: [facility] CATH LAB;  Service: Cardiovascular;  Laterality: N/A;     reports that he quit smoking about 46 years ago. His smoking use included Cigarettes. He quit after 20.00 years of use. He has never used smokeless tobacco. He reports that he does not drink alcohol or use drugs.  Allergies  Allergen Reactions  . Nsaids Nausea Only    Reaction:  GI upset   . Ibuprofen Other (See Comments)    Reaction:  GI upset   . Ace Inhibitors Cough    Family History  Problem Relation Age of Onset  . Diabetes Mother   . Heart disease Mother     Heart Disease before age 27  . Hyperlipidemia Mother   . Hypertension Mother   . CVA Mother 60    cause of death  . Cancer Father     stomach/liver  . Hypertension Father     possibly hypertensive  . Cancer Sister     Breast cancer  . CAD Brother   . Heart disease Brother   . Cancer Paternal Uncle     colon  . Heart disease Sister   . Diabetes Sister   . Hypertension Sister   . Heart disease Daughter     Heart Disease before age 4  . Hypertension Daughter   . Heart attack Daughter      Prior to Admission medications   Medication Sig Start Date End Date Taking? Authorizing Provider  acetaminophen (TYLENOL) 650 MG CR tablet Take 650 mg by mouth 2 (two)  times daily.   Yes Historical Provider, MD  amiodarone (PACERONE) 200 MG tablet Take 100 mg by mouth daily.   Yes Historical Provider, MD  atorvastatin (LIPITOR) 80 MG tablet Take 80 mg by mouth at bedtime.  Yes Historical Provider, MD  calcium carbonate (OSCAL) 1500 (600 Ca) MG TABS tablet Take 600 mg of elemental calcium by mouth daily with breakfast.   Yes Historical Provider, MD  carvedilol (COREG) 25 MG tablet Take 1 tablet (25 mg total) by mouth 2 (two) times daily with a meal. 04/16/16  Yes Nishant Dhungel, MD  cloNIDine (CATAPRES) 0.1 MG tablet Take 1 tablet (0.1 mg total) by mouth 3 (three) times daily. 06/03/16  Yes Nita Sells, MD  cyanocobalamin (,VITAMIN B-12,) 1000 MCG/ML injection Inject 1,000 mcg into the muscle every 30 (thirty) days.    Yes Historical Provider, MD  epoetin alfa (EPOGEN,PROCRIT) 21308 UNIT/ML injection Inject 10,000 Units into the skin every 14 (fourteen) days.    Yes Historical Provider, MD  ferrous sulfate 325 (65 FE) MG tablet Take 1 tablet (325 mg total) by mouth 3 (three) times daily with meals. 02/24/16  Yes Christina P Rama, MD  furosemide (LASIX) 80 MG tablet Take 40 mg by mouth daily.   Yes Historical Provider, MD  hydrALAZINE (APRESOLINE) 25 MG tablet Take 3 tablets (75 mg total) by mouth 4 (four) times daily. 02/24/16  Yes Venetia Maxon Rama, MD  isosorbide mononitrate (IMDUR) 120 MG 24 hr tablet Take 120 mg by mouth daily.   Yes Historical Provider, MD  leuprolide (LUPRON) 30 MG injection Inject 30 mg into the muscle every 4 (four) months.   Yes Historical Provider, MD  levothyroxine (SYNTHROID) 50 MCG tablet Take 1 tablet (50 mcg total) by mouth daily before breakfast. 06/22/16  Yes Belva Crome, MD  Multiple Vitamin (MULTIVITAMIN WITH MINERALS) TABS tablet Take 1 tablet by mouth daily.    Yes Historical Provider, MD  Nutritional Supplements (FEEDING SUPPLEMENT, GLUCERNA 1.2 CAL,) LIQD Take 237 mLs by mouth 2 (two) times daily as needed (for  nutritional supplement).    Yes Historical Provider, MD  nystatin (NYSTATIN) powder Apply 1 g topically 2 (two) times daily.   Yes Historical Provider, MD  potassium chloride SA (K-DUR,KLOR-CON) 20 MEQ tablet Take 20 mEq by mouth 2 (two) times daily.   Yes Historical Provider, MD  senna-docusate (SENOKOT-S) 8.6-50 MG tablet Take 1 tablet by mouth daily.   Yes Historical Provider, MD  silodosin (RAPAFLO) 8 MG CAPS capsule Take 8 mg by mouth at bedtime.    Yes Historical Provider, MD  warfarin (COUMADIN) 6 MG tablet Take 12 mg by mouth every evening.   Yes Historical Provider, MD    Physical Exam: Vitals:   06/24/16 1748 06/24/16 2121 06/24/16 2122 06/24/16 2151  BP: (!) 218/85 (!) 230/98 (!) 230/98 (!) 208/88  Pulse: 74 79  76  Resp: 18 16  18   Temp:      TempSrc:      SpO2: 99% 95%  97%  Weight:      Height:          Constitutional: NAD, calm, chronically ill appearing Vitals:   06/24/16 1748 06/24/16 2121 06/24/16 2122 06/24/16 2151  BP: (!) 218/85 (!) 230/98 (!) 230/98 (!) 208/88  Pulse: 74 79  76  Resp: 18 16  18   Temp:      TempSrc:      SpO2: 99% 95%  97%  Weight:      Height:       Eyes: PERRL, lids and conjunctivae normal ENMT: Mucous membranes are DRY. Posterior pharynx clear of any exudate or lesions. Normal dentition.  Neck: normal appearance, supple Respiratory: clear to auscultation bilaterally, no wheezing, no crackles. Normal  respiratory effort. No accessory muscle use.  Cardiovascular: Normal rate, regular rhythm, no murmurs / rubs / gallops. 2+ edema bilaterally.  2+ pedal pulses. GI: abdomen is soft and compressible.  No distention.  Tender to palpation in bilateral lower quadrants.  Bowel sounds are present. Musculoskeletal:  No joint deformity in upper and lower extremities. Decreased ROM in leg but no contractures. Normal muscle tone.  No chest wall tenderness.  + right CVA tenderness.  Moves all four extremities spontaneously but right leg is weaker than  the left. Skin: warm and dry, sacral ulcer present on admission Neurologic: Mild right leg weakness compared to left; otherwise, no focal deficits. Psychiatric: Normal judgment and insight. Alert and oriented x 3. Appears tired but mood is appropriate.    Labs on Admission: I have personally reviewed following labs and imaging studies  CBC:  Recent Labs Lab 06/18/16 1627 06/21/16 1124 06/24/16 1556 06/24/16 1606  WBC 6.7 5.0 12.0*  --   NEUTROABS  --  3.6 9.5*  --   HGB 8.6* 9.1* 10.8* 12.2*  HCT 26.6* 29.0* 32.4* 36.0*  MCV 93.0 92.7 89.8  --   PLT 193 184 221  --    Basic Metabolic Panel:  Recent Labs Lab 06/18/16 1627 06/24/16 1556 06/24/16 1606  NA 133* 135 138  K 5.2 3.8 3.8  CL 106 103 105  CO2 17* 23  --   GLUCOSE 141* 131* 130*  BUN 49* 53* 51*  CREATININE 3.05* 2.79* 2.70*  CALCIUM 8.0* 8.8*  --   MG  --  1.9  --    GFR: Estimated Creatinine Clearance: 23.3 mL/min (by C-G formula based on SCr of 2.7 mg/dL (H)). Liver Function Tests:  Recent Labs Lab 06/18/16 1627 06/24/16 1556  AST 26 19  ALT 8* 9*  ALKPHOS 40 52  BILITOT 0.3 0.5  PROT 6.5 8.2*  ALBUMIN 2.6* 2.9*   Coagulation Profile:  Recent Labs Lab 06/22/16 06/24/16 1556  INR 3.7 2.45   Urine analysis:    Component Value Date/Time   COLORURINE YELLOW 06/24/2016 1420   APPEARANCEUR CLOUDY (A) 06/24/2016 1420   LABSPEC 1.008 06/24/2016 1420   LABSPEC 1.010 05/19/2015 1231   PHURINE 5.0 06/24/2016 1420   GLUCOSEU NEGATIVE 06/24/2016 1420   GLUCOSEU Negative 05/19/2015 1231   HGBUR MODERATE (A) 06/24/2016 1420   BILIRUBINUR NEGATIVE 06/24/2016 1420   BILIRUBINUR Negative 05/19/2015 1231   KETONESUR NEGATIVE 06/24/2016 1420   PROTEINUR 30 (A) 06/24/2016 1420   UROBILINOGEN 0.2 05/19/2015 1231   NITRITE POSITIVE (A) 06/24/2016 1420   LEUKOCYTESUR LARGE (A) 06/24/2016 1420   LEUKOCYTESUR Negative 05/19/2015 1231   Sepsis Labs:  Lactic acid level 0.78  Radiological Exams on  Admission: Dg Chest 2 View  Result Date: 06/24/2016 CLINICAL DATA:  Weakness, recent endocarditis EXAM: CHEST  2 VIEW COMPARISON:  05/25/2016 FINDINGS: Cardiomediastinal silhouette is stable. Status post CABG. Atherosclerotic calcifications of thoracic aorta. Bilateral small pleural effusion. Streaky bilateral basilar atelectasis or infiltrate right greater than left. No pulmonary edema. IMPRESSION: Status post CABG. Atherosclerotic calcifications of thoracic aorta. Bilateral small pleural effusion. Streaky bilateral basilar atelectasis or infiltrate right greater than left. No pulmonary edema. Electronically Signed   By: Lahoma Crocker M.D.   On: 06/24/2016 15:05   Mr Brain Wo Contrast  Result Date: 06/24/2016 CLINICAL DATA:  LEFT lower extremity weakness. History of prostate cancer, diabetes. EXAM: MRI HEAD WITHOUT CONTRAST TECHNIQUE: Multiplanar, multiecho pulse sequences of the brain and surrounding structures were obtained without intravenous  contrast. COMPARISON:  CT HEAD February 20, 2016 FINDINGS: BRAIN: No reduced diffusion to suggest acute ischemia or hypercellular tumor. Punctate chronic microhemorrhage LEFT parietal lobe. The ventricles and sulci are normal for patient's age. Patchy to confluent supratentorial white matter FLAIR T2 hyperintensities. No suspicious parenchymal signal, masses or mass effect. Multiple small cortical infarcts. Prominent symmetric perivascular spaces, normal variant. No abnormal extra-axial fluid collections. No extra-axial masses though, contrast enhanced sequences would be more sensitive. VASCULAR: Normal major intracranial vascular flow voids present at skull base. SKULL AND UPPER CERVICAL SPINE: No abnormal sellar expansion. No suspicious calvarial bone marrow signal. Craniocervical junction maintained. Trace atlantodental fusion. SINUSES/ORBITS: Lobulated LEFT maxillary sinus mucosal thickening on, trace remaining paranasal sinus mucosal thickening. Trace mastoid  effusions. The included ocular globes and orbital contents are non-suspicious. OTHER: None. IMPRESSION: No acute intracranial process. Involutional changes. Moderate chronic small vessel ischemic disease. Electronically Signed   By: Elon Alas M.D.   On: 06/24/2016 21:16    Assessment/Plan Principal Problem:   Hypertensive crisis Active Problems:   PAF (paroxysmal atrial fibrillation) (HCC)   CKD (chronic kidney disease), stage IV (HCC)   Anemia of chronic disease   MDS (myelodysplastic syndrome) (HCC)   Pressure ulcer   Acute back pain   Acute on chronic diastolic CHF (congestive heart failure) (HCC)   Debility   Leg weakness, bilateral   UTI (urinary tract infection)   Fall   Warfarin anticoagulation   Hypertensive urgency      Hypertensive urgency --The patient is being admitted to the stepdown unit.  If BP does not respond to initial interventions, he will need a drip. --Continue home regimen of Coreg, clonidine, hydralazine, Imdur  Fall with leg weakness --No evidence of CVA --Agree with MRI of lumbar spine when MRI is available again --PT eval and treat --Family wants placement in SNF  Recurrent UTI --IV cefepime since he has a history of Pseudomonas UTI --Repeat urine culture  Acute exacerbation of chronic dCHF; patient appears volume overloaded on exam --Diuresis with IV lasix --1500cc fluid restriction --Daily weights --Strict I/O  CKD 4, Creatinine mildly elevated from baseline --Follow trend with diuresis  Warfarin anticoagulation for RUE DVT --INR therapeutic on admission --Pharmacy to manage --Daily INR  History of PAF, PPM removed earlier this year --Telemetry monitoring --Continue amiodarone  Hypothyroidsim --Continue current dose of levothyroxine for now --Check TSH  Sacral ulcer, POA --Wound care   DVT prophylaxis: Anticoagulated with warfarin for RUE DVT Code Status: FULL Family Communication: Patient's wife and daughter  present in the ED at time of admission. Disposition Plan: SNF Consults called: NONE Admission status: Inpatient, stepdown unit.   TIME SPENT: 75 minutes   Eber Jones MD [facility] Pager [phone]  If 7PM-7AM, please contact night-coverage [email or web address] Password [facility]  06/24/2016, 10:33 PM

## 2016-06-24 NOTE — ED Notes (Signed)
Unsuccessful IV attempt x2. Kristen RN asked to attempt. 

## 2016-06-24 NOTE — ED Notes (Signed)
This RN attempted to start IV x 2.

## 2016-06-24 NOTE — ED Notes (Signed)
Patient transported to MRI 

## 2016-06-24 NOTE — ED Notes (Signed)
Hospitalist at bedside 

## 2016-06-24 NOTE — ED Provider Notes (Signed)
[facility] DEPT Provider Note   CSN: [id number] Arrival date & time: 06/24/16  1309     History   Chief Complaint Chief Complaint  Patient presents with  . Weakness    HPI [name]. is a 80 y.o. male.  HPI  Patient presents with concern of weakness, tremor. Patient acknowledges history of multiple medical issues including prostate cancer, recent hospitalization for sepsis. He notes that over the past few days he has developed tremor in all his extremities, as well as increasing bilateral lower extremity weakness. 3 days ago the patient had a fall due to leg weakness, fell against the wall, striking his head. He did not lose consciousness. He denies confusion or disorientation. Patient denies chest pain, abdominal pain, nausea, vomiting, does acknowledge anorexia, weakness, fatigue, particularly with exertion. Note, patient had sepsis, likely from infected implanted cardiac device, this was removed earlier this past year.   Past Medical History:  Diagnosis Date  . Angiomyolipoma 2009   On both kidneys noted in 2009  . Arthritis    neck and left wrist  . Atrial fibrillation ([place])   . CAD (coronary artery disease)    s/b CABG 1994, and subsequent stents. Repeat CABG 12/2011,  . Chronic diastolic heart failure ([place])   . CKD (chronic kidney disease)   . Clostridium difficile diarrhea 03/2016  . Depressive disorder   . Diabetes mellitus type 2 with peripheral artery disease (HCC)    DIET CONTROLLED  . DVT (deep venous thrombosis) ([place]) 2011   Right arm  . Gastroparesis   . GERD (gastroesophageal reflux disease)   . Gout   . History of hiatal hernia   . Hyperlipidemia   . Hypertension   . Internal hemorrhoids without mention of complication   . Ischemic colitis ([place])   . 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 Myelodysplastic syndrome ([place]) 05/22/2013   With low hemoglobin and platelets treated with Procrit  . Osteopenia   . Peptic ulcer    S/p partial  gastrectomy in 1969  . Peripheral artery disease ([place])   . Pneumonia 01/16/2016  . Pneumonia 03/2016  . Presence of permanent cardiac pacemaker   . Prostate cancer ([place]) 1997   XRT and lupron  . Renal artery stenosis ([place])   . Sick sinus syndrome ([place])   . Vitamin B 12 deficiency     Patient Active Problem List   Diagnosis Date Noted  . Encounter for therapeutic drug monitoring 06/07/2016  . Pressure injury of skin 05/27/2016  . Acute deep vein thrombosis (DVT) of right upper extremity ([place]) 05/25/2016  . History of pacemaker 05/03/2016  . Anemia, iron deficiency 04/16/2016  . Endocarditis of tricuspid valve   . C. difficile colitis 04/10/2016  . HCAP (healthcare-associated pneumonia) 04/08/2016  . Chronic kidney disease, stage III (moderate) 03/09/2016  . Pressure ulcer 02/23/2016  . Seizure ([place]) 02/20/2016  . Palliative care encounter   . Goals of care, counseling/discussion   . Hypotension   . Pacemaker infection ([place])   . Acute renal failure superimposed on stage 4 chronic kidney disease ([place]) 01/26/2016  . Bacteremia due to Staphylococcus aureus   . Pseudogout involving multiple joints 01/24/2016  . Bladder mass 01/24/2016  . Hemoptysis   . Pain   . Left upper extremity swelling   . Staphylococcus aureus bacteremia 01/17/2016  . CAP (community acquired pneumonia) 01/16/2016  . S/P hernia repair 10/28/2015  . Right inguinal hernia 09/09/2015  . GIB (gastrointestinal bleeding) 08/29/2015  . Hematuria 05/19/2015  .  Thrombocytopenia ([place]) 11/11/2014  . Leukopenia 11/11/2014  . B12 deficiency 08/31/2013  . Pacemaker 08/26/2013  . On amiodarone therapy 08/21/2013  . Gout due to renal impairment, left ankle and foot 06/13/2013    Class: Acute  . CAD of autologous bypass graft 06/05/2013  . MDS (myelodysplastic syndrome) ([place]) 05/22/2013  . Anemia of chronic disease 03/19/2013  . CKD (chronic kidney disease), stage IV ([place]) 03/18/2013  . NSVT (nonsustained  ventricular tachycardia) ([place]) 03/18/2013  . Chronic diastolic heart failure ([place]) 03/17/2013  . Diabetes mellitus ([place]) 03/17/2013  . Essential hypertension 03/17/2013  . Mesenteric artery stenosis ([place]) 05/01/2012  . PAF (paroxysmal atrial fibrillation) ([place]) 12/24/2011  . PAD (peripheral artery disease) ([place]) 12/24/2011  . Chronic vascular insufficiency of intestine ([place]) 12/20/2011    Past Surgical History:  Procedure Laterality Date  . ABDOMINAL ANGIOGRAM N/A 05/25/2011   Procedure: ABDOMINAL ANGIOGRAM;  Surgeon: Serafina Mitchell, MD;  Location: [facility] CATH LAB;  Service: Cardiovascular;  Laterality: N/A;  . ABDOMINAL ANGIOGRAM N/A 02/13/2013   Procedure: ABDOMINAL ANGIOGRAM;  Surgeon: Serafina Mitchell, MD;  Location: [facility] LAB;  Service: Cardiovascular;  Laterality: N/A;  . APPENDECTOMY  1991  . CELIAC ARTERY ANGIOPLASTY  05-16-12   and stenting  . CHOLECYSTECTOMY  Oct 2009   Laparoscopic  . CORONARY ARTERY BYPASS GRAFT  01/22/1993  . CORONARY ARTERY BYPASS GRAFT  01/03/2012   Procedure: REDO CORONARY ARTERY BYPASS GRAFTING (CABG);  Surgeon: Gaye Pollack, MD;  Location: [place];  Service: Open Heart Surgery;  Laterality: N/A;  Redo CABG x  using bilateral internal mammary arteries;  left leg greater saphenous vein harvested endoscopically  . FOREIGN BODY REMOVAL ABDOMINAL Right 01/27/2016   Procedure: EXPOSURE OF RIGHT COMMON FEMORAL ARTERY AND REMOVAL FOREIGN;  Surgeon: Evans Lance, MD;  Location: [facility];  Service: Cardiovascular;  Laterality: Right;  . HIATAL HERNIA REPAIR     and ulcer repair  . I&D EXTREMITY Left 01/21/2016   Procedure: IRRIGATION AND DEBRIDEMENT EXTREMITY;  Surgeon: Milly Jakob, MD;  Location: [place];  Service: Orthopedics;  Laterality: Left;  . INGUINAL HERNIA REPAIR Right 10/28/2015   Procedure: OPEN RIGHT INGUINAL HERNIA REPAIR;  Surgeon: Greer Pickerel, MD;  Location: [facility];  Service: General;  Laterality: Right;  . INSERTION OF MESH Right 10/28/2015   Procedure:  INSERTION OF MESH;  Surgeon: Greer Pickerel, MD;  Location: [facility];  Service: General;  Laterality: Right;  . IR GENERIC HISTORICAL  02/02/2016   IR FLUORO GUIDE CV LINE LEFT 02/02/2016 Markus Daft, MD [facility]  . IR GENERIC HISTORICAL  02/02/2016   IR US GUIDE VASC ACCESS LEFT 02/02/2016 Markus Daft, MD [facility]  . IR GENERIC HISTORICAL  03/31/2016   IR REMOVAL TUN CV CATH W/O FL 03/31/2016 Arne Cleveland, MD [facility]  . LEFT HEART CATHETERIZATION WITH CORONARY/GRAFT ANGIOGRAM  12/24/2011   Procedure: LEFT HEART CATHETERIZATION WITH Beatrix Fetters;  Surgeon: Sinclair Grooms, MD;  Location: [facility] CATH LAB;  Service: Cardiovascular;;  . LOWER EXTREMITY ANGIOGRAM Bilateral 05/25/2011   Procedure: LOWER EXTREMITY ANGIOGRAM;  Surgeon: Serafina Mitchell, MD;  Location: [facility] CATH LAB;  Service: Cardiovascular;  Laterality: Bilateral;  . OTHER SURGICAL HISTORY  02/13/13   superior mesenteric artery angiogram  . OTHER SURGICAL HISTORY  05/16/12   Stent in stomach  . PACEMAKER GENERATOR CHANGE  12/10/2003   SJM Identity XL DR performed by Dr Leonia Reeves  . PACEMAKER INSERTION  10/18/1994   DDD pacemaker, [employer].  Gen change 12/10/2003.  Marland Kitchen PACEMAKER LEAD REMOVAL Right 01/27/2016   Procedure: PACEMAKER EXTRACTION;  Surgeon: Evans Lance, MD;  Location: [facility];  Service: Cardiovascular;  Laterality: Right;  DR. Roxy Manns TO BACKUP CASE  . PARTIAL GASTRECTOMY  1969   Hx of ulcer s/p partial gastrectomy/ has pernicious anemia  . RENAL ANGIOGRAM N/A 02/13/2013   Procedure: RENAL ANGIOGRAM;  Surgeon: Serafina Mitchell, MD;  Location: [facility] CATH LAB;  Service: Cardiovascular;  Laterality: N/A;  . TEE WITHOUT CARDIOVERSION N/A 01/26/2016   Procedure: TRANSESOPHAGEAL ECHOCARDIOGRAM (TEE);  Surgeon: Sueanne Margarita, MD;  Location: [facility] ENDOSCOPY;  Service: Cardiovascular;  Laterality: N/A;  . TEE WITHOUT CARDIOVERSION N/A 01/27/2016   Procedure: TRANSESOPHAGEAL ECHOCARDIOGRAM (TEE);  Surgeon: Evans Lance, MD;  Location: [facility];  Service: Cardiovascular;  Laterality: N/A;  . TEE WITHOUT CARDIOVERSION N/A 04/12/2016   Procedure: TRANSESOPHAGEAL ECHOCARDIOGRAM (TEE);  Surgeon: Dorothy Spark, MD;  Location: [facility];  Service: Cardiovascular;  Laterality: N/A;  . THROMBECTOMY / EMBOLECTOMY SUBCLAVIAN ARTERY  02/02/10   Right subclavian thromboectomy and venous angioplasty, and chronic mesenteric ischemia with Herculink stenting to superior mesenteric and celiac arteries - Dr. Trula Slade  . TRANSURETHRAL RESECTION OF PROSTATE N/A 02/22/2016   Procedure: CYSTO, CLOT EVACUATION, FULGERATION;  Surgeon: Alexis Frock, MD;  [facility];  Service: Urology;  Laterality: N/A;  . VISCERAL ANGIOGRAM N/A 05/25/2011   Procedure: VISCERAL ANGIOGRAM;  Surgeon: Serafina Mitchell, MD;  Location: [facility] CATH LAB;  Service: Cardiovascular;  Laterality: N/A;  . VISCERAL ANGIOGRAM Bilateral 12/28/2011   Procedure: VISCERAL ANGIOGRAM;  Surgeon: Serafina Mitchell, MD;  Location: [facility] CATH LAB;  Service: Cardiovascular;  Laterality: Bilateral;  . VISCERAL ANGIOGRAM N/A 05/16/2012   Procedure: VISCERAL ANGIOGRAM;  Surgeon: Serafina Mitchell, MD;  Location: [facility] CATH LAB;  Service: Cardiovascular;  Laterality: N/A;  . VISCERAL ANGIOGRAM N/A 02/13/2013   Procedure: VISCERAL ANGIOGRAM;  Surgeon: Serafina Mitchell, MD;  Location: [facility] CATH LAB;  Service: Cardiovascular;  Laterality: N/A;       Home Medications    Prior to Admission medications   Medication Sig Start Date End Date Taking? Authorizing Provider  acetaminophen (ARTHRITIS PAIN RELIEF) 650 MG CR tablet Take 650 mg by mouth 2 (two) times daily.     Historical Provider, MD  amiodarone (PACERONE) 200 MG tablet Take one-half tablet by  mouth daily 01/07/16   Belva Crome, MD  atorvastatin (LIPITOR) 80 MG tablet Take 80 mg by mouth at bedtime.     Historical Provider, MD  CALCIUM PO Take 1 tablet by mouth daily.    Historical Provider, MD  carvedilol (COREG) 25 MG tablet Take 1 tablet (25 mg total)  by mouth 2 (two) times daily with a meal. 04/16/16   Nishant Dhungel, MD  cloNIDine (CATAPRES) 0.1 MG tablet Take 1 tablet (0.1 mg total) by mouth 3 (three) times daily. 06/03/16   Nita Sells, MD  cyanocobalamin (,VITAMIN B-12,) 1000 MCG/ML injection Inject 1,000 mcg into the muscle every 30 (thirty) days.     Historical Provider, MD  epoetin alfa (EPOGEN,PROCRIT) 60454 UNIT/ML injection Inject 10,000 Units into the skin every 14 (fourteen) days. Done at [facility] - last injection 09/22/15    Historical Provider, MD  ferrous sulfate 325 (65 FE) MG tablet Take 1 tablet (325 mg total) by mouth 3 (three) times daily with meals. 02/24/16   Venetia Maxon Rama, MD  furosemide (LASIX) 80 MG tablet Take 40 mg by mouth daily.  Historical Provider, MD  hydrALAZINE (APRESOLINE) 25 MG tablet Take 3 tablets (75 mg total) by mouth 4 (four) times daily. 02/24/16   Venetia Maxon Rama, MD  isosorbide mononitrate (IMDUR) 120 MG 24 hr tablet Take 1 tablet by mouth  daily 01/07/16   Belva Crome, MD  leuprolide (LUPRON) 30 MG injection Inject 30 mg into the muscle every 4 (four) months.    Historical Provider, MD  levothyroxine (SYNTHROID) 50 MCG tablet Take 1 tablet (50 mcg total) by mouth daily before breakfast. 06/22/16   Belva Crome, MD  Multiple Vitamin (MULTIVITAMIN WITH MINERALS) TABS tablet Take 1 tablet by mouth every morning.    Historical Provider, MD  Nutritional Supplements (FEEDING SUPPLEMENT, GLUCERNA 1.2 CAL,) LIQD Take 237 mLs by mouth 2 (two) times daily as needed (nutritional supplement).    Historical Provider, MD  [facility] powder Apply 1 application topically 2 (two) times daily. 05/19/16   Historical Provider, MD  potassium chloride SA (K-DUR,KLOR-CON) 20 MEQ tablet Take 1 tablet by mouth twice a day on the days you take your Lasix (every other day) 05/10/16   Isaiah Serge, NP  sennosides-docusate sodium (SENOKOT-S) 8.6-50 MG tablet Take 1 tablet by mouth daily.    Historical Provider, MD    silodosin (RAPAFLO) 8 MG CAPS capsule Take 8 mg by mouth at bedtime.     Historical Provider, MD  warfarin (COUMADIN) 6 MG tablet Take 1 tablet to 1 1/2 tablets daily or as directed by [facility] Clinic. 06/07/16   Belva Crome, MD    Family History Family History  Problem Relation Age of Onset  . Diabetes Mother   . Heart disease Mother     Heart Disease before age 81  . Hyperlipidemia Mother   . Hypertension Mother   . CVA Mother 18    cause of death  . Cancer Father     stomach/liver  . Hypertension Father     possibly hypertensive  . Cancer Sister     Breast cancer  . CAD Brother   . Heart disease Brother   . Cancer Paternal Uncle     colon  . Heart disease Sister   . Diabetes Sister   . Hypertension Sister   . Heart disease Daughter     Heart Disease before age 27  . Hypertension Daughter   . Heart attack Daughter     Social History Social History  Substance Use Topics  . Smoking status: Former Smoker    Years: 20.00    Types: Cigarettes    Quit date: 07/12/1969  . Smokeless tobacco: Never Used  . Alcohol use No     Allergies   Nsaids; Ibuprofen; and Ace inhibitors   Review of Systems Review of Systems  Constitutional:       Per HPI, otherwise negative  HENT:       Per HPI, otherwise negative  Respiratory:       Per HPI, otherwise negative  Cardiovascular:       Per HPI, otherwise negative  Gastrointestinal: Negative for vomiting.  Endocrine:       Negative aside from HPI  Genitourinary:       Neg aside from HPI   Musculoskeletal:       Per HPI, otherwise negative  Skin: Negative.   Neurological: Positive for tremors and weakness. Negative for syncope and headaches.     Physical Exam Updated Vital Signs BP 194/88 (BP Location: Right Arm)   Pulse 61  Temp 97.5 F (36.4 C) (Oral)   Resp 18   Ht 5\' 10"  (1.778 m)   Wt 180 lb (81.6 kg)   SpO2 94%   BMI 25.83 kg/m   Physical Exam  Constitutional: He is oriented to person, place, and  time. He has a sickly appearance. No distress.  HENT:  Head: Normocephalic and atraumatic.  Eyes: Conjunctivae and EOM are normal.  Cardiovascular: Normal rate and regular rhythm.   Pulmonary/Chest: Effort normal. No stridor. No respiratory distress.  Abdominal: He exhibits no distension.  Musculoskeletal: He exhibits no edema.  Diffuse bilateral lower extremity edema, no gross deformities  Neurological: He is alert and oriented to person, place, and time. He displays atrophy. No cranial nerve deficit. He exhibits abnormal muscle tone. He displays no seizure activity.  3/5 strength bilateral lower extremities proximal and distal 4/5 upper extremities bilaterally symmetric No gross discoordination  Skin: Skin is warm and dry.  Psychiatric: He is slowed. Cognition and memory are not impaired.  Nursing note and vitals reviewed.    ED Treatments / Results  Labs (all labs ordered are listed, but only abnormal results are displayed) Labs Reviewed  COMPREHENSIVE METABOLIC PANEL - Abnormal; Notable for the following:       Result Value   Glucose, Bld 131 (*)    BUN 53 (*)    Creatinine, Ser 2.79 (*)    Calcium 8.8 (*)    Total Protein 8.2 (*)    Albumin 2.9 (*)    ALT 9 (*)    GFR calc non Af Amer 20 (*)    GFR calc Af Amer 23 (*)    All other components within normal limits  CBC WITH DIFFERENTIAL/PLATELET - Abnormal; Notable for the following:    WBC 12.0 (*)    RBC 3.61 (*)    Hemoglobin 10.8 (*)    HCT 32.4 (*)    RDW 18.7 (*)    Neutro Abs 9.5 (*)    Monocytes Absolute 1.1 (*)    All other components within normal limits  URINALYSIS, ROUTINE W REFLEX MICROSCOPIC - Abnormal; Notable for the following:    APPearance CLOUDY (*)    Hgb urine dipstick MODERATE (*)    Protein, ur 30 (*)    Nitrite POSITIVE (*)    Leukocytes, UA LARGE (*)    Bacteria, UA RARE (*)    All other components within normal limits  PROTIME-INR - Abnormal; Notable for the following:    Prothrombin  Time 27.1 (*)    All other components within normal limits  BRAIN NATRIURETIC PEPTIDE - Abnormal; Notable for the following:    B Natriuretic Peptide 1,575.0 (*)    All other components within normal limits  I-STAT CHEM 8, ED - Abnormal; Notable for the following:    BUN 51 (*)    Creatinine, Ser 2.70 (*)    Glucose, Bld 130 (*)    Hemoglobin 12.2 (*)    HCT 36.0 (*)    All other components within normal limits  MAGNESIUM  I-STAT CG4 LACTIC ACID, ED    Radiology Dg Chest 2 View  Result Date: 06/24/2016 CLINICAL DATA:  Weakness, recent endocarditis EXAM: CHEST  2 VIEW COMPARISON:  05/25/2016 FINDINGS: Cardiomediastinal silhouette is stable. Status post CABG. Atherosclerotic calcifications of thoracic aorta. Bilateral small pleural effusion. Streaky bilateral basilar atelectasis or infiltrate right greater than left. No pulmonary edema. IMPRESSION: Status post CABG. Atherosclerotic calcifications of thoracic aorta. Bilateral small pleural effusion. Streaky bilateral basilar atelectasis or  infiltrate right greater than left. No pulmonary edema. Electronically Signed   By: Lahoma Crocker M.D.   On: 06/24/2016 15:05   Mr Brain Wo Contrast  Result Date: 06/24/2016 CLINICAL DATA:  LEFT lower extremity weakness. History of prostate cancer, diabetes. EXAM: MRI HEAD WITHOUT CONTRAST TECHNIQUE: Multiplanar, multiecho pulse sequences of the brain and surrounding structures were obtained without intravenous contrast. COMPARISON:  CT HEAD February 20, 2016 FINDINGS: BRAIN: No reduced diffusion to suggest acute ischemia or hypercellular tumor. Punctate chronic microhemorrhage LEFT parietal lobe. The ventricles and sulci are normal for patient's age. Patchy to confluent supratentorial white matter FLAIR T2 hyperintensities. No suspicious parenchymal signal, masses or mass effect. Multiple small cortical infarcts. Prominent symmetric perivascular spaces, normal variant. No abnormal extra-axial fluid collections.  No extra-axial masses though, contrast enhanced sequences would be more sensitive. VASCULAR: Normal major intracranial vascular flow voids present at skull base. SKULL AND UPPER CERVICAL SPINE: No abnormal sellar expansion. No suspicious calvarial bone marrow signal. Craniocervical junction maintained. Trace atlantodental fusion. SINUSES/ORBITS: Lobulated LEFT maxillary sinus mucosal thickening on, trace remaining paranasal sinus mucosal thickening. Trace mastoid effusions. The included ocular globes and orbital contents are non-suspicious. OTHER: None. IMPRESSION: No acute intracranial process. Involutional changes. Moderate chronic small vessel ischemic disease. Electronically Signed   By: Elon Alas M.D.   On: 06/24/2016 21:16    Procedures Procedures (including critical care time)  Medications Ordered in ED Medications  sodium chloride 0.9 % bolus 1,000 mL (not administered)    Chart review notable for multiple emergency department visits, recent hospitalization for endocarditis, initiation of Coumadin therapy for DVT.  Initial Impression / Assessment and Plan / ED Course  I have reviewed the triage vital signs and the nursing notes.  Pertinent labs & imaging results that were available during my care of the patient were reviewed by me and considered in my medical decision making (see chart for details).  Clinical Course     9:30 PM Patient has returned from MRI. He remains hypertensive, received hydralazine, clonidine, Lasix. Notably, the MRI machine broke with the patient was completing his scan, and only the MRI imaging of brain is available. Labs discussed the patient and family members again, including persistent chronic kidney disease, elevated BNP, white count, and evidence for urinary tract infection. Given concern for UTI, hypertensive crisis, with systolic pressure greater than 230, patient will require admission to the step down unit. Initial MRI results reassuring,  no evidence for stroke or hemorrhage, but given the patient's lower extremity weakness, and his ongoing low back pain, MRI as an inpatient is a consideration.   Final Clinical Impressions(s) / ED Diagnoses   Final diagnoses:  Weakness  Urinary tract infection without hematuria, site unspecified  Hypertensive urgency   CRITICAL CARE Performed by: Carmin Muskrat Total critical care time: 35 minutes Critical care time was exclusive of separately billable procedures and treating other patients. Critical care was necessary to treat or prevent imminent or life-threatening deterioration. Critical care was time spent personally by me on the following activities: development of treatment plan with patient and/or surrogate as well as nursing, discussions with consultants, evaluation of patient's response to treatment, examination of patient, obtaining history from patient or surrogate, ordering and performing treatments and interventions, ordering and review of laboratory studies, ordering and review of radiographic studies, pulse oximetry and re-evaluation of patient's condition.    Carmin Muskrat, MD 06/24/16 2132

## 2016-06-24 NOTE — ED Notes (Signed)
Patient transported to X-ray 

## 2016-06-24 NOTE — Progress Notes (Signed)
[place] for Warfarin Indication: Atrial fibrillation, hx of DVT  Allergies  Allergen Reactions  . Nsaids Nausea Only    Reaction:  GI upset   . Ibuprofen Other (See Comments)    Reaction:  GI upset   . Ace Inhibitors Cough   Patient Measurements: Height: 5\' 10"  (177.8 cm) Weight: 180 lb (81.6 kg) IBW/kg (Calculated) : 73  Vital Signs: Temp: 97.5 F (36.4 C) (12/14 1318) Temp Source: Oral (12/14 1318) BP: 208/88 (12/14 2151) Pulse Rate: 76 (12/14 2151)  Labs:  Recent Labs  06/22/16 06/24/16 1556 06/24/16 1606  HGB  --  10.8* 12.2*  HCT  --  32.4* 36.0*  PLT  --  221  --   LABPROT  --  27.1*  --   INR 3.7 2.45  --   CREATININE  --  2.79* 2.70*   Estimated Creatinine Clearance: 23.3 mL/min (by C-G formula based on SCr of 2.7 mg/dL (H)).  Medical History: Past Medical History:  Diagnosis Date  . Angiomyolipoma 2009   On both kidneys noted in 2009  . Arthritis    neck and left wrist  . Atrial fibrillation ([place])   . CAD (coronary artery disease)    s/b CABG 1994, and subsequent stents. Repeat CABG 12/2011,  . Chronic diastolic heart failure ([place])   . CKD (chronic kidney disease)   . Clostridium difficile diarrhea 03/2016  . Depressive disorder   . Diabetes mellitus type 2 with peripheral artery disease (HCC)    DIET CONTROLLED  . DVT (deep venous thrombosis) ([place]) 2011   Right arm  . Gastroparesis   . GERD (gastroesophageal reflux disease)   . Gout   . History of hiatal hernia   . Hyperlipidemia   . Hypertension   . Internal hemorrhoids without mention of complication   . Ischemic colitis ([place])   . 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 Myelodysplastic syndrome ([place]) 05/22/2013   With low hemoglobin and platelets treated with Procrit  . Osteopenia   . Peptic ulcer    S/p partial gastrectomy in 1969  . Peripheral artery disease ([place])   . Pneumonia 01/16/2016  . Pneumonia 03/2016  . Presence of permanent cardiac pacemaker    . [place]state cancer ([place]) 1997   XRT and lupron  . Renal artery stenosis ([place])   . Sick sinus syndrome ([place])   . Vitamin B 12 deficiency    Medications:  Scheduled:  . [START ON 06/25/2016] amiodarone  100 mg Oral Daily  . atorvastatin  80 mg Oral QHS  . carvedilol  25 mg Oral BID WC  . [START ON 06/25/2016] ceFEPime (MAXIPIME) IV  1 g Intravenous Q24H  . ceFEPime (MAXIPIME) IV  2 g Intravenous Once  . cloNIDine  0.1 mg Oral TID  . furosemide  80 mg Intravenous Once  . [START ON 06/25/2016] furosemide  80 mg Intravenous Q12H  . hydrALAZINE  75 mg Oral QID  . [START ON 06/25/2016] isosorbide mononitrate  120 mg Oral Daily  . [START ON 06/25/2016] levothyroxine  50 mcg Oral QAC breakfast  . potassium chloride SA  20 mEq Oral BID  . sodium chloride flush  3 mL Intravenous Q12H  . [START ON 06/25/2016] tamsulosin  0.4 mg Oral QPC supper  . warfarin  12 mg Oral QPM   Assessment: 80 yoM to ED 12/14 with weakness, fall at home 3 days PTA. - Coag clinic visit 12/12 with INR 3.7, hold Warf 12/13 > 6mg   Admit INR 2.45  Goal of Therapy:  INR 2-3 Monitor platelets by anticoagulation protocol: Yes   Plan:  - Warfarin 6mg  tonight - Daily PT/INR  Minda Ditto PharmD Pager [phone] 06/24/2016, 10:42 PM    Nyoka Cowden, Karna Christmas L 06/24/2016,10:34 PM

## 2016-06-24 NOTE — Progress Notes (Signed)
Patient noted to have had four admissions within the last six months. Patient's last admission from 11/14-11/23 for DVT right upper extremity on coumadin, CHF, Cdiff and pseudomonas cystitis. Patient was discharged with home health services with [facility]. Per chart review, patient made [place] patient with [facility] on 11/14.  Per chart review, patient lives at home with spouse. No further EDCM needs at this time.

## 2016-06-24 NOTE — Progress Notes (Signed)
Pharmacy Antibiotic Follow-up Note  [name]. is a 80 y.o. year-old male admitted on 06/24/2016.  The patient is currently on day 1 of Cefepime for rule bacteremia. Patient to ED 12/14 with weakness, fall at home 3 days PTA. Hx of MSSA bacteremia, pacemaker removed, tx with Ancef 7/7-25; + Rifampin to 8/3. Re-admit with CDiff 9/28 tx Po Vanc/IV Flagyl. Found tricuspid vegetation with neg BCx: tx with 14 days po Vanc, 6 wk Ancef to 11/14. Re-admit 11/14 with worsening Cr, PICC removed - found RUE DVT > Warfarin. UCx Pseudomonas tx with Fosfomycin.  Assessment/Plan: Cefepime 2gm x1, then 1gm q24 Monitor renal function closely with CKD.  Temp (24hrs), Avg:97.5 F (36.4 C), Min:97.5 F (36.4 C), Max:97.5 F (36.4 C)   Recent Labs Lab 06/18/16 1627 06/21/16 1124 06/24/16 1556  WBC 6.7 5.0 12.0*    Recent Labs Lab 06/18/16 1627 06/24/16 1556 06/24/16 1606  CREATININE 3.05* 2.79* 2.70*   Estimated Creatinine Clearance: 23.3 mL/min (by C-G formula based on SCr of 2.7 mg/dL (H)).    Allergies  Allergen Reactions  . Nsaids Nausea Only    Reaction:  GI upset   . Ibuprofen Other (See Comments)    Reaction:  GI upset   . Ace Inhibitors Cough   Antimicrobials this admission: 12/14 Cefepime >>   Levels/dose changes this admission:  Microbiology results: No Cx ordered at this time  Thank you for allowing pharmacy to be a part of this patient's care.  Minda Ditto PharmD Pager [phone] 06/24/2016, 9:28 PM

## 2016-06-24 NOTE — ED Notes (Signed)
ED Provider at bedside. 

## 2016-06-24 NOTE — ED Notes (Signed)
Bed: WA06 Expected date:  Expected time:  Means of arrival:  Comments: EMS- 81yo M, generalized weakness/back pain from recent fall

## 2016-06-25 ENCOUNTER — Inpatient Hospital Stay (HOSPITAL_COMMUNITY): Payer: Medicare Other

## 2016-06-25 ENCOUNTER — Other Ambulatory Visit: Payer: Medicare Other

## 2016-06-25 DIAGNOSIS — N390 Urinary tract infection, site not specified: Secondary | ICD-10-CM

## 2016-06-25 DIAGNOSIS — N178 Other acute kidney failure: Secondary | ICD-10-CM

## 2016-06-25 DIAGNOSIS — M545 Low back pain: Secondary | ICD-10-CM

## 2016-06-25 DIAGNOSIS — N184 Chronic kidney disease, stage 4 (severe): Secondary | ICD-10-CM

## 2016-06-25 LAB — BASIC METABOLIC PANEL
ANION GAP: 9 (ref 5–15)
BUN: 54 mg/dL — AB (ref 6–20)
CO2: 20 mmol/L — AB (ref 22–32)
Calcium: 8.2 mg/dL — ABNORMAL LOW (ref 8.9–10.3)
Chloride: 104 mmol/L (ref 101–111)
Creatinine, Ser: 2.64 mg/dL — ABNORMAL HIGH (ref 0.61–1.24)
GFR calc Af Amer: 25 mL/min — ABNORMAL LOW (ref >60)
GFR calc non Af Amer: 22 mL/min — ABNORMAL LOW (ref >60)
GLUCOSE: 175 mg/dL — AB (ref 65–99)
POTASSIUM: 3.3 mmol/L — AB (ref 3.5–5.1)
Sodium: 133 mmol/L — ABNORMAL LOW (ref 135–145)

## 2016-06-25 LAB — BRAIN NATRIURETIC PEPTIDE: B NATRIURETIC PEPTIDE 5: 1430.8 pg/mL — AB (ref 0.0–100.0)

## 2016-06-25 LAB — TSH: TSH: 22.371 u[IU]/mL — AB (ref 0.350–4.500)

## 2016-06-25 LAB — PROTIME-INR
INR: 2.67
Prothrombin Time: 28.9 seconds — ABNORMAL HIGH (ref 11.4–15.2)

## 2016-06-25 MED ORDER — WARFARIN SODIUM 4 MG PO TABS
4.0000 mg | ORAL_TABLET | Freq: Once | ORAL | Status: AC
  Administered 2016-06-25: 4 mg via ORAL
  Filled 2016-06-25: qty 1

## 2016-06-25 MED ORDER — HYDRALAZINE HCL 50 MG PO TABS
50.0000 mg | ORAL_TABLET | Freq: Three times a day (TID) | ORAL | Status: DC
  Administered 2016-06-26 – 2016-06-27 (×4): 50 mg via ORAL
  Filled 2016-06-25 (×4): qty 1

## 2016-06-25 MED ORDER — HYDROCODONE-ACETAMINOPHEN 7.5-325 MG PO TABS
1.0000 | ORAL_TABLET | Freq: Four times a day (QID) | ORAL | Status: DC | PRN
  Administered 2016-06-28 – 2016-07-01 (×4): 1 via ORAL
  Filled 2016-06-25 (×5): qty 1

## 2016-06-25 NOTE — Progress Notes (Signed)
PROGRESS NOTE                                                                                                                                                                                                             Patient Demographics:    [name], is a 80 y.o. male, DOB - 1936/04/28, TX:[id number]  Admit date - 06/24/2016   Admitting Physician Lily Kocher, MD  Outpatient Primary MD for the patient is [name], MD  LOS - 1  Outpatient Specialists: cardiology  Chief Complaint  Patient presents with  . Weakness       Brief Narrative   80 year old male with multiple comorbidities including CAD with history of CABG, PCI, PAF/sick sinus syndrome status post PPM (was removed recently due to systemic infection), taken off anticoagulation due to bleeding complications, chronic diastolic CHF, hypertension, diabetes mellitus, CAD stage IV, myelodysplastic syndrome and hypothyroidism was brought to the ED by his wife and daughter with generalized weakness and fall at home 3 days prior to admission. 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 difficile colitis and pseudomonal UTI. He was discharged on Coumadin for DVT. Patient fell at home 3 days prior to admission stating that his legs gave away. No loss of consciousness but landed on his eyes and had pain in his right shoulder and mid to lower back. Denies any bowel or urinary symptoms. Denies chest pain, palpitations, but has increased dyspnea on exertion. Reports good appetite. Family have been looking for SNF options.  In the ED He was hypertensive with systolic blood pressure in 200s. Labs showed WBC of 12, mildly elevated BUN and creatinine (53/2.79), UA positive for UTI. MRI of brain was negative for acute intracranial activity. MRI of the lumbar spine pending. admitted to  stepdown for further managmeent    Subjective:   C/o pain in right  lower back. Reports poor appetite   Assessment  & Plan :    Principal Problem:   Hypertensive crisis I suspect pain contributing to symptoms. Resume home blood pressure medications (Coreg, clonidine, hydralazine, Imdur). Much better controlled this morning. Able on monitor.  Active Problems: Fall with leg weakness and low back pain. MRI negative for stroke. MRI of the lumbar spine ordered. PT evaluation. Family wanting patient to  be go to SNF. Patient agrees as well.  Recurrent UTI On empiric cefepime given history of Pseudomonas UTI. Follow urine culture.  Acute on chronic diastolic CHF Placed on IV Lasix. Strict I/O and daily weight. Fluid restriction. Continue beta blocker and Imdur.     PAF (paroxysmal atrial fibrillation) (HCC) Rate controlled. Continue beta blocker and amiodarone. On warfarin for upper extremity DVT.    Acute on CKD (chronic kidney disease), stage IV (HCC) Monitor with diuresis. Avoid nephrotoxic agents.    Anemia of chronic disease Stable.  Low back pain and lower extremity weakness Pain control with when necessary Vicodin and low-dose Dilaudid. Check MRI of the back. PT evaluation.  BPH Continue Flomax.  Recent upper extremity DVT On warfarin. Dosing per pharmacy.   CAD with history of CABG (94 and 2013) Follows with Dr. Tamala Julian. Continue beta blocker.  History of PAF/sick sinus syndrome Pacemaker removed early this morning due to infection. Not on chronic anticoagulation due to bleeding complete dictations.  Hypothyroidism Continue Synthroid.  Dependent sacral pressure ulcer Wound care evaluation.  Code Status : Full code  Family Communication  : None at bedside  Disposition Plan  : Transfer to telemetry. May need SNF  Barriers For Discharge : Active symptoms  Consults  :  None  Procedures  :  MRI brain MRI lumbar spine  DVT Prophylaxis  :  Coumadin  Lab  Results  Component Value Date   PLT 221 06/24/2016    Antibiotics  :   Anti-infectives    Start     Dose/Rate Route Frequency Ordered Stop   06/25/16 2200  ceFEPIme (MAXIPIME) 1 g in dextrose 5 % 50 mL IVPB     1 g 100 mL/hr over 30 Minutes Intravenous Every 24 hours 06/24/16 2229     06/24/16 2145  ceFEPIme (MAXIPIME) 2 g in dextrose 5 % 50 mL IVPB     2 g 100 mL/hr over 30 Minutes Intravenous  Once 06/24/16 2135 06/25/16 0022        Objective:   Vitals:   06/25/16 0400 06/25/16 0500 06/25/16 0600 06/25/16 0800  BP: (!) 157/58 (!) 141/46 (!) 145/102   Pulse: 71 68 74   Resp: 10 10 17    Temp: 98.6 F (37 C)   98.7 F (37.1 C)  TempSrc: Oral   Oral  SpO2: 97% 96% 92%   Weight:  79.3 kg (174 lb 13.2 oz)    Height:        Wt Readings from Last 3 Encounters:  06/25/16 79.3 kg (174 lb 13.2 oz)  06/07/16 78.4 kg (172 lb 12.8 oz)  06/03/16 83.1 kg (183 lb 3.2 oz)     Intake/Output Summary (Last 24 hours) at 06/25/16 1059 Last data filed at 06/25/16 0800  Gross per 24 hour  Intake             1050 ml  Output               50 ml  Net             1000 ml     Physical Exam  Gen: not in distressAnd appears fatigued HEENT: no pallor, moist mucosa, supple neck Chest: clear b/l, no added sounds CVS: N S1&S2, no murmurs, rubs or gallop GI: soft, NT, ND, BS+ Musculoskeletal: warm, trace edema bilaterally, mobility not assessed CNS: Alert and oriented, non focal    Data Review:    CBC  Recent Labs Lab 06/18/16 1627 06/21/16 1124  06/24/16 1556 06/24/16 1606  WBC 6.7 5.0 12.0*  --   HGB 8.6* 9.1* 10.8* 12.2*  HCT 26.6* 29.0* 32.4* 36.0*  PLT 193 184 221  --   MCV 93.0 92.7 89.8  --   MCH 30.1 29.1 29.9  --   MCHC 32.3 31.4* 33.3  --   RDW 19.5* 18.8* 18.7*  --   LYMPHSABS  --  1.0 1.3  --   MONOABS  --  0.4 1.1*  --   EOSABS  --  0.0 0.0  --   BASOSABS  --  0.0 0.0  --     Chemistries   Recent Labs Lab 06/18/16 1627 06/24/16 1556 06/24/16 1606  06/25/16 0340  NA 133* 135 138 133*  K 5.2 3.8 3.8 3.3*  CL 106 103 105 104  CO2 17* 23  --  20*  GLUCOSE 141* 131* 130* 175*  BUN 49* 53* 51* 54*  CREATININE 3.05* 2.79* 2.70* 2.64*  CALCIUM 8.0* 8.8*  --  8.2*  MG  --  1.9  --   --   AST 26 19  --   --   ALT 8* 9*  --   --   ALKPHOS 40 52  --   --   BILITOT 0.3 0.5  --   --    ------------------------------------------------------------------------------------------------------------------ No results for input(s): CHOL, HDL, LDLCALC, TRIG, CHOLHDL, LDLDIRECT in the last 72 hours.  Lab Results  Component Value Date   HGBA1C 4.9 02/20/2016   ------------------------------------------------------------------------------------------------------------------  Recent Labs  06/25/16 0340  TSH 22.371*   ------------------------------------------------------------------------------------------------------------------ No results for input(s): VITAMINB12, FOLATE, FERRITIN, TIBC, IRON, RETICCTPCT in the last 72 hours.  Coagulation profile  Recent Labs Lab 06/22/16 06/24/16 1556 06/25/16 0340  INR 3.7 2.45 2.67    No results for input(s): DDIMER in the last 72 hours.  Cardiac Enzymes No results for input(s): CKMB, TROPONINI, MYOGLOBIN in the last 168 hours.  Invalid input(s): CK ------------------------------------------------------------------------------------------------------------------    Component Value Date/Time   BNP 1,430.8 (H) 06/25/2016 0340   BNP 891.5 (H) 06/18/2016 1627    Inpatient Medications  Scheduled Meds: . amiodarone  100 mg Oral Daily  . atorvastatin  80 mg Oral QHS  . carvedilol  25 mg Oral BID WC  . ceFEPime (MAXIPIME) IV  1 g Intravenous Q24H  . cloNIDine  0.1 mg Oral TID  . furosemide  80 mg Intravenous Q12H  . hydrALAZINE  75 mg Oral QID  . isosorbide mononitrate  120 mg Oral Daily  . levothyroxine  50 mcg Oral QAC breakfast  . potassium chloride SA  20 mEq Oral BID  . sodium  chloride flush  3 mL Intravenous Q12H  . tamsulosin  0.4 mg Oral QPC supper  . warfarin  4 mg Oral ONCE-1800  . Warfarin - Pharmacist Dosing Inpatient   Does not apply q1800   Continuous Infusions: PRN Meds:.sodium chloride, acetaminophen, HYDROcodone-acetaminophen, HYDROmorphone (DILAUDID) injection, ondansetron (ZOFRAN) IV, sodium chloride flush  Micro Results Recent Results (from the past 240 hour(s))  MRSA PCR Screening     Status: None   Collection Time: 06/24/16 10:41 PM  Result Value Ref Range Status   MRSA by PCR NEGATIVE NEGATIVE Final    Comment:        The GeneXpert MRSA Assay (FDA approved for NASAL specimens only), is one component of a comprehensive MRSA colonization surveillance program. It is not intended to diagnose MRSA infection nor to guide or monitor treatment for MRSA infections.  Radiology Reports Dg Chest 2 View  Result Date: 06/24/2016 CLINICAL DATA:  Weakness, recent endocarditis EXAM: CHEST  2 VIEW COMPARISON:  05/25/2016 FINDINGS: Cardiomediastinal silhouette is stable. Status post CABG. Atherosclerotic calcifications of thoracic aorta. Bilateral small pleural effusion. Streaky bilateral basilar atelectasis or infiltrate right greater than left. No pulmonary edema. IMPRESSION: Status post CABG. Atherosclerotic calcifications of thoracic aorta. Bilateral small pleural effusion. Streaky bilateral basilar atelectasis or infiltrate right greater than left. No pulmonary edema. Electronically Signed   By: Lahoma Crocker M.D.   On: 06/24/2016 15:05   Dg Ribs Unilateral Right  Result Date: 06/24/2016 CLINICAL DATA:  80 year old male with fall and a right posterior rib pain. EXAM: RIGHT RIBS - 2 VIEW COMPARISON:  Chest radiograph dated 06/24/2016 and chest radiograph dated 04/08/2016 FINDINGS: There is an age indeterminate nondisplaced fracture of the posterolateral aspect of the right ninth rib. The bones are osteopenic which limits evaluation for fracture. No  other fracture identified. There is a small right pleural effusion. Right lung base opacity may represent atelectatic changes versus infiltrate. Trace fluid noted in the minor fissure. There is cardiomegaly. Median sternotomy wires and CABG surgical clips noted. There is atherosclerotic calcification of the aorta. There is degenerative changes of the spine. Right upper quadrant cholecystectomy clips noted. IMPRESSION: Nondisplaced fracture of the posterior aspect of the right ninth rib, age indeterminate. No pneumothorax. Small right pleural effusion with right lung base atelectasis versus infiltrate. Electronically Signed   By: Anner Crete M.D.   On: 06/24/2016 23:28   Mr Brain Wo Contrast  Result Date: 06/24/2016 CLINICAL DATA:  LEFT lower extremity weakness. History of prostate cancer, diabetes. EXAM: MRI HEAD WITHOUT CONTRAST TECHNIQUE: Multiplanar, multiecho pulse sequences of the brain and surrounding structures were obtained without intravenous contrast. COMPARISON:  CT HEAD February 20, 2016 FINDINGS: BRAIN: No reduced diffusion to suggest acute ischemia or hypercellular tumor. Punctate chronic microhemorrhage LEFT parietal lobe. The ventricles and sulci are normal for patient's age. Patchy to confluent supratentorial white matter FLAIR T2 hyperintensities. No suspicious parenchymal signal, masses or mass effect. Multiple small cortical infarcts. Prominent symmetric perivascular spaces, normal variant. No abnormal extra-axial fluid collections. No extra-axial masses though, contrast enhanced sequences would be more sensitive. VASCULAR: Normal major intracranial vascular flow voids present at skull base. SKULL AND UPPER CERVICAL SPINE: No abnormal sellar expansion. No suspicious calvarial bone marrow signal. Craniocervical junction maintained. Trace atlantodental fusion. SINUSES/ORBITS: Lobulated LEFT maxillary sinus mucosal thickening on, trace remaining paranasal sinus mucosal thickening. Trace  mastoid effusions. The included ocular globes and orbital contents are non-suspicious. OTHER: None. IMPRESSION: No acute intracranial process. Involutional changes. Moderate chronic small vessel ischemic disease. Electronically Signed   By: Elon Alas M.D.   On: 06/24/2016 21:16   Mr Lumbar Spine Wo Contrast  Result Date: 06/25/2016 CLINICAL DATA:  80 year old male with generalized weakness for 3 days. Lumbar back pain after a fall. Leg weakness. Initial encounter. EXAM: MRI LUMBAR SPINE WITHOUT CONTRAST TECHNIQUE: Multiplanar, multisequence MR imaging of the lumbar spine was performed. No intravenous contrast was administered. COMPARISON:  CT Abdomen and Pelvis 04/08/2016 FINDINGS: MRI imaging quality of the lumbar spine is significantly degraded by generalize loss of signal despite repeated imaging attempts. The etiology of the signal loss seems to be generalized soft tissue edema such as anasarca. Segmentation: Normal as seen on the comparison CT Abdomen and Pelvis. Alignment: Stable since the prior CT. Mild grade 1 anterolisthesis of L5 on S1. Mild dextroconvex lumbar scoliosis. Vertebrae: Suboptimal characterization of marrow  due to the decreased signal to noise. No definite marrow edema or acute osseous abnormality. Conus medullaris: Extends to the L1 level and appears normal. Paraspinal and other soft tissues: Diffuse soft tissue edema suspected such is anasarca. This also seems to affect the visible paraspinal muscles bilaterally. Very limited visualization of abdominal viscera. The visualized kidneys appear stable to the prior CT. Disc levels: No significant lumbar spinal stenosis. Mild lumbar spine degeneration above L4. At L4-L5 there is disc bulging and moderate to severe facet and ligament flavum hypertrophy. Both facet joints are capacious and contain fluid. Small posteriorly situated synovial cyst on the left. No significant stenosis. Chronic mild anterolisthesis at L5-S1 with  circumferential disc bulge and moderate to severe facet hypertrophy. No spinal stenosis. Up to mild lateral recess stenosis and bilateral L5 foraminal stenosis. IMPRESSION: 1. MRI of the lumbar spine significantly degraded by signal loss despite repeated imaging attempts, which seems to be due to generalized body soft tissue edema such as due to anasarca. 2. Given #1, diffuse lumbar Myositis is difficult to exclude. No acute osseous abnormality identified. 3. No significant lumbar spinal stenosis. Spinal degeneration primarily only at L4-L5 and L5-S1 where there is facet arthropathy in part related to mild spondylolisthesis. Subsequent up to mild multifactorial lateral recess and foraminal stenosis. Electronically Signed   By: Genevie Ann M.D.   On: 06/25/2016 10:29    Time Spent in minutes  35   Louellen Molder M.D on 06/25/2016 at 10:59 AM  Between 7am to 7pm - Pager - [phone]  After 7pm go to [email or web address] - password [facility]  [facility] -  Office  [phone]

## 2016-06-25 NOTE — Evaluation (Signed)
Physical Therapy Evaluation Patient Details Name: [name] BRENNEKE Sr. MRN: [id number] DOB: 12-15-1935 Today's Date: 06/25/2016   History of Present Illness  80 yo male admitted with hypertensiv crisis, fall at home, R rib fx, pleural effusion, back pain. Hx of DVT, CKD, PAfib, HF, MDs, osteopenia, HTN, hemorrhagic cystitisi, anemai, C-diff  Clinical Impression  On eval, pt required Mod-Max assist for mobility. He was able to stand and take a few steps along the side of bed. Increased time and effort to complete mobility tasks. Recommend ST rehab at SNF. Will follow and progress activity as tolerated.     Follow Up Recommendations SNF    Equipment Recommendations  None recommended by PT    Recommendations for Other Services       Precautions / Restrictions Precautions Precautions: Fall Restrictions Weight Bearing Restrictions: No      Mobility  Bed Mobility Overal bed mobility: Needs Assistance Bed Mobility: Supine to Sit;Sit to Supine     Supine to sit: Mod assist Sit to supine: Mod assist   General bed mobility comments: Assist for trunk and bil LEs. Increased time. Utilized bedpad for positioning, scooting.   Transfers Overall transfer level: Needs assistance Equipment used: Rolling walker (2 wheeled) Transfers: Sit to/from Stand Sit to Stand: From elevated surface;Max assist         General transfer comment: Stood x 1 for ~1 minute. 2 attempts to get to full standing posture. Assist to rise, stabilize, control descent.   Ambulation/Gait Ambulation/Gait assistance: Min assist           General Gait Details: Side steps along side of bed with RW.   Stairs            Wheelchair Mobility    Modified Rankin (Stroke Patients Only)       Balance Overall balance assessment: Needs assistance;History of Falls           Standing balance-Leahy Scale: Poor Standing balance comment: requires RW                             Pertinent  Vitals/Pain Pain Assessment: Faces Faces Pain Scale: Hurts little more Pain Location: pt did not state location Pain Intervention(s): Monitored during session;Repositioned    Home Living Family/patient expects to be discharged to:: Skilled nursing facility                      Prior Function Level of Independence: Needs assistance   Gait / Transfers Assistance Needed: RW for ambulation  ADL's / Homemaking Assistance Needed: wife assist with ADLs        Hand Dominance        Extremity/Trunk Assessment   Upper Extremity Assessment Upper Extremity Assessment: Generalized weakness    Lower Extremity Assessment Lower Extremity Assessment: Generalized weakness    Cervical / Trunk Assessment Cervical / Trunk Assessment: Normal  Communication   Communication: No difficulties  Cognition Arousal/Alertness: Awake/alert Behavior During Therapy: Flat affect Overall Cognitive Status: Within Functional Limits for tasks assessed                      General Comments      Exercises General Exercises - Lower Extremity Long Arc Quad: AROM;Both;10 reps;Seated   Assessment/Plan    PT Assessment Patient needs continued PT services  PT Problem List Decreased strength;Decreased mobility;Decreased activity tolerance;Decreased balance;Pain;Decreased knowledge of use of DME  PT Treatment Interventions DME instruction;Gait training;Therapeutic activities;Therapeutic exercise;Patient/family education;Functional mobility training;Balance training    PT Goals (Current goals can be found in the Care Plan section)  Acute Rehab PT Goals Patient Stated Goal: to go to rehab to get stronger PT Goal Formulation: With patient Time For Goal Achievement: 07/09/16 Potential to Achieve Goals: Good    Frequency Min 3X/week   Barriers to discharge        Co-evaluation               End of Session Equipment Utilized During Treatment: Gait belt Activity  Tolerance: Patient limited by fatigue Patient left: in bed;with call bell/phone within reach;with bed alarm set           Time: 1404-1430 PT Time Calculation (min) (ACUTE ONLY): 26 min   Charges:   PT Evaluation $PT Eval Low Complexity: 1 Procedure PT Treatments $Therapeutic Activity: 8-22 mins   PT G Codes:        Weston Anna, MPT Pager: [phone]

## 2016-06-25 NOTE — Consult Note (Signed)
[facility] Nurse wound consult note Reason for Consult: Suspected pressure injury.  Upon assessment, no pressure injury noted.  Patient with partial thickness skin injury secondary to moisture, specifically incontinence associated dermatitis (IAD). Wound type:Moisture associated skin damage (MASD), IAD. Pressure Ulcer POA: No Measurement: Affected area measures 10cm x 11cm with erythema and scattered, pinpoint partial thickness skin loss Wound [id number], moist. Drainage (amount, consistency, odor) scant serous Periwound:intact, moist, mascerated Dressing procedure/placement/frequency:Patient is on a therapeutic mattress while in ICU and an order has been placed to continue this therapy when transferred from ICU to floor.  Heels are intact; order for bilateral pressure redistribution heel boots placed today. Patient to be turned from side to side, avoiding the supine position, have minimal layers of bedlinens placed beneath patient so that he receives the maximum benefit from the therapeutic mattress.  House incontinence care products are to be used immediately following episodes of incontinence. [place] nursing team will not follow, but will remain available to this patient, the nursing and medical teams.  Please re-consult if needed. Thanks, Maudie Flakes, MSN, RN, [place], Arther Abbott  Pager# [phone]

## 2016-06-25 NOTE — Progress Notes (Signed)
[facility] Indication: Atrial fibrillation, hx of DVT  Allergies  Allergen Reactions  . Nsaids Nausea Only    Reaction:  GI upset   . Ibuprofen Other (See Comments)    Reaction:  GI upset   . Ace Inhibitors Cough   Patient Measurements: Height: 5\' 10"  (177.8 cm) Weight: 174 lb 13.2 oz (79.3 kg) IBW/kg (Calculated) : 73  Vital Signs: Temp: 98.6 F (37 C) (12/15 0400) Temp Source: Oral (12/15 0400) BP: 145/102 (12/15 0600) Pulse Rate: 74 (12/15 0600)  Labs:  Recent Labs  06/24/16 1556 06/24/16 1606 06/25/16 0340  HGB 10.8* 12.2*  --   HCT 32.4* 36.0*  --   PLT 221  --   --   LABPROT 27.1*  --  28.9*  INR 2.45  --  2.67  CREATININE 2.79* 2.70* 2.64*   Estimated Creatinine Clearance: 23.8 mL/min (by C-G formula based on SCr of 2.64 mg/dL (H)).  Medications:  Scheduled:  . amiodarone  100 mg Oral Daily  . atorvastatin  80 mg Oral QHS  . carvedilol  25 mg Oral BID WC  . ceFEPime (MAXIPIME) IV  1 g Intravenous Q24H  . cloNIDine  0.1 mg Oral TID  . furosemide  80 mg Intravenous Q12H  . hydrALAZINE  75 mg Oral QID  . isosorbide mononitrate  120 mg Oral Daily  . levothyroxine  50 mcg Oral QAC breakfast  . potassium chloride SA  20 mEq Oral BID  . sodium chloride flush  3 mL Intravenous Q12H  . tamsulosin  0.4 mg Oral QPC supper  . Warfarin - Pharmacist Dosing Inpatient   Does not apply q1800   Assessment: 54 yoM to ED 12/14 with weakness, fall at home 3 days PTA. Admit INR 2.45 - Patient stated home regimen is 12mg  daily - Coag clinic visit 12/12 with INR 3.7, hold Warf 12/13 then resume 6mg  daily    Today, 06/25/2016  INR remains therapeutic  CBC: Hgb low/stable, pltc WNL  Drug interactions: no new significant interactions (amiodarone is home med and regimen already adjusted for this)  Note patient stated home dose 12mg  daily vs coumadin clinic notes to be taking 6mg  daily (as on 12/12)  Goal of Therapy:   INR 2-3 Monitor platelets by anticoagulation protocol: Yes   Plan:   Warfarin 4mg  PO x 1 tonight  Empirically reduce dose for INR trend upwarh and acute illness (acute HF and resulting liver congestion can increase warfarin sensitivity)  Daily INR  Doreene Eland, PharmD, BCPS.   Pager: [phone] 06/25/2016 7:57 AM

## 2016-06-25 NOTE — Care Management Note (Signed)
Case Management Note  Patient Details  Name: [name] SWEEZEY Sr. MRN: [id number] Date of Birth: 1936/01/12  Subjective/Objective:        Hypertensive Crisis requiring iv blouses and iv meds            Action/Plan: home once stable Date:  June 25, 2016 Chart reviewed for concurrent status and case management needs. Will continue to follow patient progress. Discharge Planning: following for needs Expected discharge date: [id number] Velva Harman, BSN, [place], [place]   Expected Discharge Date:                  Expected Discharge Plan:  [place]  In-House Referral:     Discharge planning Services  CM Consult  Post Acute Care Choice:    Choice offered to:     DME Arranged:    DME Agency:     HH Arranged:  RN [place] Agency:  [place]  Status of Service:  In process, will continue to follow  If discussed at Long Length of Stay Meetings, dates discussed:    Additional Comments:  Leeroy Cha, RN 06/25/2016, 10:11 AM

## 2016-06-26 ENCOUNTER — Inpatient Hospital Stay (HOSPITAL_COMMUNITY): Payer: Medicare Other

## 2016-06-26 DIAGNOSIS — R197 Diarrhea, unspecified: Secondary | ICD-10-CM

## 2016-06-26 LAB — CBC
HCT: 27.5 % — ABNORMAL LOW (ref 39.0–52.0)
Hemoglobin: 9.1 g/dL — ABNORMAL LOW (ref 13.0–17.0)
MCH: 30.1 pg (ref 26.0–34.0)
MCHC: 33.1 g/dL (ref 30.0–36.0)
MCV: 91.1 fL (ref 78.0–100.0)
PLATELETS: 196 10*3/uL (ref 150–400)
RBC: 3.02 MIL/uL — ABNORMAL LOW (ref 4.22–5.81)
RDW: 19.5 % — AB (ref 11.5–15.5)
WBC: 20.4 10*3/uL — AB (ref 4.0–10.5)

## 2016-06-26 LAB — PROTIME-INR
INR: 3.71
PROTHROMBIN TIME: 37.7 s — AB (ref 11.4–15.2)

## 2016-06-26 LAB — CBC AND DIFFERENTIAL
HCT: 28 % — AB (ref 41–53)
Hemoglobin: 9.1 g/dL — AB (ref 13.5–17.5)
PLATELETS: 196 10*3/uL (ref 150–399)
WBC: 20.4 10^3/mL

## 2016-06-26 LAB — BASIC METABOLIC PANEL
ANION GAP: 10 (ref 5–15)
BUN: 56 mg/dL — ABNORMAL HIGH (ref 6–20)
BUN: 56 mg/dL — AB (ref 4–21)
CALCIUM: 8.4 mg/dL — AB (ref 8.9–10.3)
CO2: 20 mmol/L — ABNORMAL LOW (ref 22–32)
CREATININE: 3.3 mg/dL — AB (ref 0.6–1.3)
Chloride: 106 mmol/L (ref 101–111)
Creatinine, Ser: 3.34 mg/dL — ABNORMAL HIGH (ref 0.61–1.24)
GFR, EST AFRICAN AMERICAN: 19 mL/min — AB (ref >60)
GFR, EST NON AFRICAN AMERICAN: 16 mL/min — AB (ref >60)
Glucose, Bld: 125 mg/dL — ABNORMAL HIGH (ref 65–99)
Glucose: 125 mg/dL
POTASSIUM: 4 mmol/L (ref 3.5–5.1)
Potassium: 4 mmol/L (ref 3.4–5.3)
SODIUM: 136 mmol/L — AB (ref 137–147)
Sodium: 136 mmol/L (ref 135–145)

## 2016-06-26 LAB — OCCULT BLOOD X 1 CARD TO LAB, STOOL: Fecal Occult Bld: POSITIVE — AB

## 2016-06-26 LAB — SODIUM, URINE, RANDOM: SODIUM UR: 61 mmol/L

## 2016-06-26 LAB — CREATININE, URINE, RANDOM: CREATININE, URINE: 60.58 mg/dL

## 2016-06-26 MED ORDER — FUROSEMIDE 10 MG/ML IJ SOLN
40.0000 mg | Freq: Two times a day (BID) | INTRAMUSCULAR | Status: DC
  Administered 2016-06-26 – 2016-06-27 (×2): 40 mg via INTRAVENOUS
  Filled 2016-06-26 (×2): qty 4

## 2016-06-26 NOTE — NC FL2 (Signed)
[place] LEVEL OF CARE SCREENING TOOL     IDENTIFICATION  Patient Name: [name] WOLTER Sr. Birthdate: 04-Feb-1936 Sex: male Admission Date (Current Location): 06/24/2016  [facility] and [place] Number:  Herbalist and Address:  [facility],  [place] [address], [place]      Provider Number: [id number]  Attending Physician Name and Address:  Louellen Molder, MD  Relative Name and Phone Number:       Current Level of Care: Hospital Recommended Level of Care: [place] Prior Approval Number:    Date Approved/Denied:   PASRR Number:   [id number]   Discharge Plan: SNF    Current Diagnoses: Patient Active Problem List   Diagnosis Date Noted  . Hypertensive crisis 06/24/2016  . Acute back pain 06/24/2016  . Acute on chronic diastolic CHF (congestive heart failure) ([place]) 06/24/2016  . Debility 06/24/2016  . Leg weakness, bilateral 06/24/2016  . UTI (urinary tract infection) 06/24/2016  . Fall 06/24/2016  . Warfarin anticoagulation 06/24/2016  . Hypertensive urgency 06/24/2016  . Encounter for therapeutic drug monitoring 06/07/2016  . Pressure injury of skin 05/27/2016  . Acute deep vein thrombosis (DVT) of right upper extremity ([place]) 05/25/2016  . History of pacemaker 05/03/2016  . Anemia, iron deficiency 04/16/2016  . Endocarditis of tricuspid valve   . C. difficile colitis 04/10/2016  . HCAP (healthcare-associated pneumonia) 04/08/2016  . Chronic kidney disease, stage III (moderate) 03/09/2016  . Pressure ulcer 02/23/2016  . Seizure ([place]) 02/20/2016  . Palliative care encounter   . Goals of care, counseling/discussion   . Hypotension   . Pacemaker infection ([place])   . Acute renal failure superimposed on stage 4 chronic kidney disease ([place]) 01/26/2016  . Bacteremia due to Staphylococcus aureus   . Pseudogout involving multiple joints 01/24/2016  . Bladder mass 01/24/2016  . Hemoptysis   . Pain   . Left  upper extremity swelling   . Staphylococcus aureus bacteremia 01/17/2016  . CAP (community acquired pneumonia) 01/16/2016  . S/P hernia repair 10/28/2015  . Right inguinal hernia 09/09/2015  . GIB (gastrointestinal bleeding) 08/29/2015  . Hematuria 05/19/2015  . Thrombocytopenia ([place]) 11/11/2014  . Leukopenia 11/11/2014  . B12 deficiency 08/31/2013  . Pacemaker 08/26/2013  . On amiodarone therapy 08/21/2013  . Gout due to renal impairment, left ankle and foot 06/13/2013    Class: Acute  . CAD of autologous bypass graft 06/05/2013  . MDS (myelodysplastic syndrome) ([place]) 05/22/2013  . Anemia of chronic disease 03/19/2013  . CKD (chronic kidney disease), stage IV ([place]) 03/18/2013  . NSVT (nonsustained ventricular tachycardia) ([place]) 03/18/2013  . Chronic diastolic heart failure ([place]) 03/17/2013  . Diabetes mellitus ([place]) 03/17/2013  . Essential hypertension 03/17/2013  . Mesenteric artery stenosis ([place]) 05/01/2012  . PAF (paroxysmal atrial fibrillation) ([place]) 12/24/2011  . PAD (peripheral artery disease) ([place]) 12/24/2011  . Chronic vascular insufficiency of intestine (H[facility]) 12/20/2011    Orientation RESPIRATION BLADDER Height & Weight     Self, Time, Place, Situation  Normal Incontinent, External catheter Weight: 174 lb 13.2 oz (79.3 kg) Height:  5\' 10"  (177.8 cm)  BEHAVIORAL SYMPTOMS/MOOD NEUROLOGICAL BOWEL NUTRITION STATUS   (none )  (none ) Continent Diet (Heart Healthy )  AMBULATORY STATUS COMMUNICATION OF NEEDS Skin   Limited Assist Verbally Other (Comment) (Unstageable Pressure Ulcer )                       Personal Care Assistance Level of  Assistance  Bathing, Feeding, Dressing Bathing Assistance: Maximum assistance Feeding assistance: Limited assistance Dressing Assistance: Maximum assistance     Functional Limitations Info  Speech, Hearing, Sight Sight Info: Adequate Hearing Info: Adequate Speech Info: Adequate    SPECIAL CARE FACTORS FREQUENCY  PT (By  licensed PT)     PT Frequency: 3              Contractures      Additional Factors Info  Code Status, Allergies Code Status Info: FULL CODE  Allergies Info: N/A           Current Medications (06/26/2016):  This is the current hospital active medication list Current Facility-Administered Medications  Medication Dose Route Frequency Provider Last Rate Last Dose  . 0.9 %  sodium chloride infusion  250 mL Intravenous PRN Lily Kocher, MD      . acetaminophen (TYLENOL) tablet 650 mg  650 mg Oral Q4H PRN Lily Kocher, MD      . amiodarone (PACERONE) tablet 100 mg  100 mg Oral Daily Lily Kocher, MD   100 mg at 06/26/16 0907  . atorvastatin (LIPITOR) tablet 80 mg  80 mg Oral QHS Lily Kocher, MD   80 mg at 06/25/16 2200  . carvedilol (COREG) tablet 25 mg  25 mg Oral BID WC Lily Kocher, MD   25 mg at 06/26/16 0908  . ceFEPIme (MAXIPIME) 1 g in dextrose 5 % 50 mL IVPB  1 g Intravenous Q24H Minda Ditto, RPH   1 g at 06/25/16 2200  . furosemide (LASIX) injection 40 mg  40 mg Intravenous Q12H Nishant Dhungel, MD      . hydrALAZINE (APRESOLINE) tablet 50 mg  50 mg Oral Q8H Reyne Dumas, MD   50 mg at 06/26/16 0539  . HYDROcodone-acetaminophen (NORCO) 7.5-325 MG per tablet 1 tablet  1 tablet Oral Q6H PRN Lily Kocher, MD      . HYDROmorphone (DILAUDID) injection 0.5 mg  0.5 mg Intravenous Q3H PRN Lily Kocher, MD   0.5 mg at 06/26/16 0919  . isosorbide mononitrate (IMDUR) 24 hr tablet 120 mg  120 mg Oral Daily Lily Kocher, MD   120 mg at 06/26/16 0908  . levothyroxine (SYNTHROID, LEVOTHROID) tablet 50 mcg  50 mcg Oral QAC breakfast Lily Kocher, MD   50 mcg at 06/26/16 0909  . ondansetron (ZOFRAN) injection 4 mg  4 mg Intravenous Q6H PRN Lily Kocher, MD      . potassium chloride SA (K-DUR,KLOR-CON) CR tablet 20 mEq  20 mEq Oral BID Lily Kocher, MD   20 mEq at 06/26/16 0908  . sodium chloride flush (NS) 0.9 % injection 3 mL  3 mL Intravenous Q12H Lily Kocher, MD   3 mL at 06/25/16 1050   . sodium chloride flush (NS) 0.9 % injection 3 mL  3 mL Intravenous PRN Lily Kocher, MD      . tamsulosin [facility]) capsule 0.4 mg  0.4 mg Oral QPC supper Lily Kocher, MD   0.4 mg at 06/25/16 1839  . Warfarin - Pharmacist Dosing Inpatient   Does not apply [place], [facility]         Discharge Medications: Please see discharge summary for a list of discharge medications.  Relevant Imaging Results:  Relevant Lab Results:   Additional Information SSN [id number]  Glendon Axe A

## 2016-06-26 NOTE — Progress Notes (Signed)
PROGRESS NOTE                                                                                                                                                                                                             Patient Demographics:    [name], is a 80 y.o. male, DOB - Sep 02, 1935, [id number]  Admit date - 06/24/2016   Admitting Physician Lily Kocher, MD  Outpatient Primary MD for the patient is [name], MD  LOS - 2  Outpatient Specialists: cardiology  Chief Complaint  Patient presents with  . Weakness       Brief Narrative   80 year old male with multiple comorbidities including CAD with history of CABG, PCI, PAF/sick sinus syndrome status post PPM (was removed recently due to systemic infection), taken off anticoagulation due to bleeding complications, chronic diastolic CHF, hypertension, diabetes mellitus, CAD stage IV, myelodysplastic syndrome and hypothyroidism was brought to the ED by his wife and daughter with generalized weakness and fall at home 3 days prior to admission. 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 difficile colitis and pseudomonal UTI. He was discharged on Coumadin for DVT. Patient fell at home 3 days prior to admission stating that his legs gave away. No loss of consciousness but landed on his eyes and had pain in his right shoulder and mid to lower back. Denies any bowel or urinary symptoms. Denies chest pain, palpitations, but has increased dyspnea on exertion. Reports good appetite. Family have been looking for SNF options.  In the ED He was hypertensive with systolic blood pressure in 200s. Labs showed WBC of 12, mildly elevated BUN and creatinine (53/2.79), UA positive for UTI. MRI of brain was negative for acute intracranial activity. MRI of the lumbar spine pending. admitted to  stepdown for further managmeent    Subjective:   Reports gen weakness. Diarrhea overnight   Assessment  & Plan :    Principal Problem:   Hypertensive crisis Better with pain control. Resumed home blood pressure medications (Coreg, clonidine, hydralazine, Imdur).  Active Problems: Fall with leg weakness and low back pain. MRI negative for stroke. MRI of the lumbar spine without acute injury. ? Myositis.  PT evaluation. Family wanting patient to be go to SNF. Patient agrees as well.  Acute on CKD (chronic kidney disease), stage IV (HCC) Monitor with diuresis. Avoid nephrotoxic agents. Worsened renal fn. US shows rt hydronephrosis with possible bladder obstruction with ? Clot. Hx of hemorrhagic cystitis with bladder clot, previously requiring CBI and foley for several months.  urology consulted. recommends CT abd and pelvis w/o contrast.   Recurrent UTI On empiric cefepime given history of Pseudomonas UTI. Follow urine culture.  Acute on chronic diastolic CHF Reduce lasix dose. Poor UOP noted. Continue beta blocker and Imdur.     PAF (paroxysmal atrial fibrillation) (HCC) Rate controlled. Continue beta blocker and amiodarone. On warfarin for upper extremity DVT.     Anemia of chronic disease Stable.  Low back pain and lower extremity weakness Pain control with when necessary Vicodin and low-dose Dilaudid. Check MRI of the back. PT evaluation.  BPH Continue Flomax.  Recent upper extremity DVT On warfarin. Dosing per pharmacy.   CAD with history of CABG (94 and 2013) Follows with Dr. Tamala Julian. Continue beta blocker.  History of PAF/sick sinus syndrome Pacemaker removed early this morning due to infection. Not on chronic anticoagulation due to bleeding complete dictations.  Hypothyroidism Continue Synthroid.  Dependent sacral pressure ulcer Wound care evaluation.  Code Status : Full code  Family Communication  : daughter at bedside  Disposition Plan  : needs  SNF  Barriers For Discharge : Active symptoms  Consults  :   urology  Procedures  :  MRI brain MRI lumbar spine  DVT Prophylaxis  :  Coumadin  Lab Results  Component Value Date   PLT 196 06/26/2016    Antibiotics  :   Anti-infectives    Start     Dose/Rate Route Frequency Ordered Stop   06/25/16 2200  ceFEPIme (MAXIPIME) 1 g in dextrose 5 % 50 mL IVPB     1 g 100 mL/hr over 30 Minutes Intravenous Every 24 hours 06/24/16 2229     06/24/16 2145  ceFEPIme (MAXIPIME) 2 g in dextrose 5 % 50 mL IVPB     2 g 100 mL/hr over 30 Minutes Intravenous  Once 06/24/16 2135 06/25/16 0022        Objective:   Vitals:   06/25/16 1050 06/25/16 1241 06/25/16 2237 06/26/16 0536  BP: (!) 164/46 (!) 138/52 (!) 131/56 (!) 182/65  Pulse:  62 61 64  Resp:  19 18 18   Temp:  98.9 F (37.2 C) 98.5 F (36.9 C) 98 F (36.7 C)  TempSrc:  Oral Oral Oral  SpO2:  96% 96% 98%  Weight:      Height:        Wt Readings from Last 3 Encounters:  06/25/16 79.3 kg (174 lb 13.2 oz)  06/07/16 78.4 kg (172 lb 12.8 oz)  06/03/16 83.1 kg (183 lb 3.2 oz)     Intake/Output Summary (Last 24 hours) at 06/26/16 1449 Last data filed at 06/26/16 1000  Gross per 24 hour  Intake              120 ml  Output              250 ml  Net             -130 ml     Physical Exam  Gen: not in distressAnd appears fatigued HEENT: no pallor, moist mucosa, supple neck Chest: clear b/l, no added sounds CVS: N S1&S2, no murmurs, rubs or gallop GI: soft, NT, ND, BS+ Musculoskeletal: warm, trace edema bilaterally, mobility not assessed CNS: Alert  and oriented, non focal    Data Review:    CBC  Recent Labs Lab 06/21/16 1124 06/24/16 1556 06/24/16 1606 06/26/16 0507  WBC 5.0 12.0*  --  20.4*  HGB 9.1* 10.8* 12.2* 9.1*  HCT 29.0* 32.4* 36.0* 27.5*  PLT 184 221  --  196  MCV 92.7 89.8  --  91.1  MCH 29.1 29.9  --  30.1  MCHC 31.4* 33.3  --  33.1  RDW 18.8* 18.7*  --  19.5*  LYMPHSABS 1.0 1.3  --   --     MONOABS 0.4 1.1*  --   --   EOSABS 0.0 0.0  --   --   BASOSABS 0.0 0.0  --   --     Chemistries   Recent Labs Lab 06/24/16 1556 06/24/16 1606 06/25/16 0340 06/26/16 0507  NA 135 138 133* 136  K 3.8 3.8 3.3* 4.0  CL 103 105 104 106  CO2 23  --  20* 20*  GLUCOSE 131* 130* 175* 125*  BUN 53* 51* 54* 56*  CREATININE 2.79* 2.70* 2.64* 3.34*  CALCIUM 8.8*  --  8.2* 8.4*  MG 1.9  --   --   --   AST 19  --   --   --   ALT 9*  --   --   --   ALKPHOS 52  --   --   --   BILITOT 0.5  --   --   --    ------------------------------------------------------------------------------------------------------------------ No results for input(s): CHOL, HDL, LDLCALC, TRIG, CHOLHDL, LDLDIRECT in the last 72 hours.  Lab Results  Component Value Date   HGBA1C 4.9 02/20/2016   ------------------------------------------------------------------------------------------------------------------  Recent Labs  06/25/16 0340  TSH 22.371*   ------------------------------------------------------------------------------------------------------------------ No results for input(s): VITAMINB12, FOLATE, FERRITIN, TIBC, IRON, RETICCTPCT in the last 72 hours.  Coagulation profile  Recent Labs Lab 06/22/16 06/24/16 1556 06/25/16 0340 06/26/16 0507  INR 3.7 2.45 2.67 3.71    No results for input(s): DDIMER in the last 72 hours.  Cardiac Enzymes No results for input(s): CKMB, TROPONINI, MYOGLOBIN in the last 168 hours.  Invalid input(s): CK ------------------------------------------------------------------------------------------------------------------    Component Value Date/Time   BNP 1,430.8 (H) 06/25/2016 0340   BNP 891.5 (H) 06/18/2016 1627    Inpatient Medications  Scheduled Meds: . amiodarone  100 mg Oral Daily  . atorvastatin  80 mg Oral QHS  . carvedilol  25 mg Oral BID WC  . ceFEPime (MAXIPIME) IV  1 g Intravenous Q24H  . furosemide  80 mg Intravenous Q12H  . hydrALAZINE  50  mg Oral Q8H  . isosorbide mononitrate  120 mg Oral Daily  . levothyroxine  50 mcg Oral QAC breakfast  . potassium chloride SA  20 mEq Oral BID  . sodium chloride flush  3 mL Intravenous Q12H  . tamsulosin  0.4 mg Oral QPC supper  . Warfarin - Pharmacist Dosing Inpatient   Does not apply q1800   Continuous Infusions: PRN Meds:.sodium chloride, acetaminophen, HYDROcodone-acetaminophen, HYDROmorphone (DILAUDID) injection, ondansetron (ZOFRAN) IV, sodium chloride flush  Micro Results Recent Results (from the past 240 hour(s))  MRSA PCR Screening     Status: None   Collection Time: 06/24/16 10:41 PM  Result Value Ref Range Status   MRSA by PCR NEGATIVE NEGATIVE Final    Comment:        The GeneXpert MRSA Assay (FDA approved for NASAL specimens only), is one component of a comprehensive MRSA colonization surveillance program. It is  not intended to diagnose MRSA infection nor to guide or monitor treatment for MRSA infections.   Culture, Urine     Status: Abnormal (Preliminary result)   Collection Time: 06/25/16  2:53 AM  Result Value Ref Range Status   Specimen Description URINE, RANDOM  Final   Special Requests NONE  Final   Culture >=100,000 COLONIES/mL PSEUDOMONAS AERUGINOSA (A)  Final   Report Status PENDING  Incomplete    Radiology Reports Dg Chest 2 View  Result Date: 06/24/2016 CLINICAL DATA:  Weakness, recent endocarditis EXAM: CHEST  2 VIEW COMPARISON:  05/25/2016 FINDINGS: Cardiomediastinal silhouette is stable. Status post CABG. Atherosclerotic calcifications of thoracic aorta. Bilateral small pleural effusion. Streaky bilateral basilar atelectasis or infiltrate right greater than left. No pulmonary edema. IMPRESSION: Status post CABG. Atherosclerotic calcifications of thoracic aorta. Bilateral small pleural effusion. Streaky bilateral basilar atelectasis or infiltrate right greater than left. No pulmonary edema. Electronically Signed   By: Lahoma Crocker M.D.   On:  06/24/2016 15:05   Dg Ribs Unilateral Right  Result Date: 06/24/2016 CLINICAL DATA:  80 year old male with fall and a right posterior rib pain. EXAM: RIGHT RIBS - 2 VIEW COMPARISON:  Chest radiograph dated 06/24/2016 and chest radiograph dated 04/08/2016 FINDINGS: There is an age indeterminate nondisplaced fracture of the posterolateral aspect of the right ninth rib. The bones are osteopenic which limits evaluation for fracture. No other fracture identified. There is a small right pleural effusion. Right lung base opacity may represent atelectatic changes versus infiltrate. Trace fluid noted in the minor fissure. There is cardiomegaly. Median sternotomy wires and CABG surgical clips noted. There is atherosclerotic calcification of the aorta. There is degenerative changes of the spine. Right upper quadrant cholecystectomy clips noted. IMPRESSION: Nondisplaced fracture of the posterior aspect of the right ninth rib, age indeterminate. No pneumothorax. Small right pleural effusion with right lung base atelectasis versus infiltrate. Electronically Signed   By: Anner Crete M.D.   On: 06/24/2016 23:28   Mr Brain Wo Contrast  Result Date: 06/24/2016 CLINICAL DATA:  LEFT lower extremity weakness. History of prostate cancer, diabetes. EXAM: MRI HEAD WITHOUT CONTRAST TECHNIQUE: Multiplanar, multiecho pulse sequences of the brain and surrounding structures were obtained without intravenous contrast. COMPARISON:  CT HEAD February 20, 2016 FINDINGS: BRAIN: No reduced diffusion to suggest acute ischemia or hypercellular tumor. Punctate chronic microhemorrhage LEFT parietal lobe. The ventricles and sulci are normal for patient's age. Patchy to confluent supratentorial white matter FLAIR T2 hyperintensities. No suspicious parenchymal signal, masses or mass effect. Multiple small cortical infarcts. Prominent symmetric perivascular spaces, normal variant. No abnormal extra-axial fluid collections. No extra-axial masses  though, contrast enhanced sequences would be more sensitive. VASCULAR: Normal major intracranial vascular flow voids present at skull base. SKULL AND UPPER CERVICAL SPINE: No abnormal sellar expansion. No suspicious calvarial bone marrow signal. Craniocervical junction maintained. Trace atlantodental fusion. SINUSES/ORBITS: Lobulated LEFT maxillary sinus mucosal thickening on, trace remaining paranasal sinus mucosal thickening. Trace mastoid effusions. The included ocular globes and orbital contents are non-suspicious. OTHER: None. IMPRESSION: No acute intracranial process. Involutional changes. Moderate chronic small vessel ischemic disease. Electronically Signed   By: Elon Alas M.D.   On: 06/24/2016 21:16   Mr Lumbar Spine Wo Contrast  Result Date: 06/25/2016 CLINICAL DATA:  80 year old male with generalized weakness for 3 days. Lumbar back pain after a fall. Leg weakness. Initial encounter. EXAM: MRI LUMBAR SPINE WITHOUT CONTRAST TECHNIQUE: Multiplanar, multisequence MR imaging of the lumbar spine was performed. No intravenous contrast was administered. COMPARISON:  CT Abdomen and Pelvis 04/08/2016 FINDINGS: MRI imaging quality of the lumbar spine is significantly degraded by generalize loss of signal despite repeated imaging attempts. The etiology of the signal loss seems to be generalized soft tissue edema such as anasarca. Segmentation: Normal as seen on the comparison CT Abdomen and Pelvis. Alignment: Stable since the prior CT. Mild grade 1 anterolisthesis of L5 on S1. Mild dextroconvex lumbar scoliosis. Vertebrae: Suboptimal characterization of marrow due to the decreased signal to noise. No definite marrow edema or acute osseous abnormality. Conus medullaris: Extends to the L1 level and appears normal. Paraspinal and other soft tissues: Diffuse soft tissue edema suspected such is anasarca. This also seems to affect the visible paraspinal muscles bilaterally. Very limited visualization of  abdominal viscera. The visualized kidneys appear stable to the prior CT. Disc levels: No significant lumbar spinal stenosis. Mild lumbar spine degeneration above L4. At L4-L5 there is disc bulging and moderate to severe facet and ligament flavum hypertrophy. Both facet joints are capacious and contain fluid. Small posteriorly situated synovial cyst on the left. No significant stenosis. Chronic mild anterolisthesis at L5-S1 with circumferential disc bulge and moderate to severe facet hypertrophy. No spinal stenosis. Up to mild lateral recess stenosis and bilateral L5 foraminal stenosis. IMPRESSION: 1. MRI of the lumbar spine significantly degraded by signal loss despite repeated imaging attempts, which seems to be due to generalized body soft tissue edema such as due to anasarca. 2. Given #1, diffuse lumbar Myositis is difficult to exclude. No acute osseous abnormality identified. 3. No significant lumbar spinal stenosis. Spinal degeneration primarily only at L4-L5 and L5-S1 where there is facet arthropathy in part related to mild spondylolisthesis. Subsequent up to mild multifactorial lateral recess and foraminal stenosis. Electronically Signed   By: Genevie Ann M.D.   On: 06/25/2016 10:29   US Renal  Result Date: 06/26/2016 CLINICAL DATA:  Acute kidney failure EXAM: RENAL / URINARY TRACT ULTRASOUND COMPLETE COMPARISON:  None. FINDINGS: Right Kidney: Length: 10.6 cm. 1.6 cm cyst in the upper pole. Moderate hydronephrosis. Diffusely increased echotexture throughout the right kidney. Left Kidney: Length: 10.1 cm. Increased echotexture throughout the left kidney. No hydronephrosis or mass. Bladder: Layering debris dependently in the bladder. Mild bladder wall irregularity and thickening. IMPRESSION: Moderate right hydronephrosis. Increased echotexture within the kidneys bilaterally compatible with chronic medical renal disease. Irregular wall thickening throughout the bladder. While this could be related to bladder  outlet obstruction, I cannot exclude infection or neoplasm. Recommend clinical correlation. Electronically Signed   By: Rolm Baptise M.D.   On: 06/26/2016 10:50    Time Spent in minutes  35   Louellen Molder M.D on 06/26/2016 at 2:49 PM  Between 7am to 7pm - Pager - [phone]  After 7pm go to [email or web address] - password [facility]  [facility] -  Office  [phone]

## 2016-06-26 NOTE — Consult Note (Signed)
New Consult Note  Requesting Physician: Louellen Molder, MD  Service Requesting Consult: Hospitalist  Urology Consult Attending: [place] Reason for Consult: Right Hydronephrosis  Subjective: [name] Marble Sr. is seen in consultation for reasons noted above at the request of Louellen Molder, MD.  This is a 80 yo patient with a history of multiple medical problems, including HTN, HLD, DM, CKD4, CAD s/p CABG and PCI, sick sinus syndrome, CHF, MDS, GERD, hypothyroidism, BPH on Flomax, prostate cancer s/p XRT with resulting radiation cystitis. He presented yesterday with generalized weakness and recent fall. He reports his legs became weak and gave out on him on Monday. Fell to floor from standing, did not fall onto anything or have any trauma to his back or flank. He has had pain in his left shoulder and right flank as well as headache, blurry vision. He had severe hypertension in the ED and was admitted to the hospitalist. He has had recent admissions for endocarditis, CHF exacerbation, DVT, and C Diff. He also had a pseudomonal UTI at that time. He had previously been on anticoagulation but this was held for bleeding complications. It has since been resumed (coumadin) since diagnosis of his DVT. On admission his creatinine was 2.8 and rose to 3.34 overnight. Urine culture positive for pseudomonas, he is on cefepime. He denies any gross hematuria and does not believe he has had any difficulty voiding.   Past Medical History: Past Medical History:  Diagnosis Date  . Angiomyolipoma 2009   On both kidneys noted in 2009  . Arthritis    neck and left wrist  . Atrial fibrillation ([place])   . CAD (coronary artery disease)    s/b CABG 1994, and subsequent stents. Repeat CABG 12/2011,  . Chronic diastolic heart failure ([place])   . CKD (chronic kidney disease)   . Clostridium difficile diarrhea 03/2016  . Depressive disorder   . Diabetes mellitus type 2 with peripheral artery disease (HCC)    DIET  CONTROLLED  . DVT (deep venous thrombosis) ([place]) 2011   Right arm  . Gastroparesis   . GERD (gastroesophageal reflux disease)   . Gout   . History of hiatal hernia   . Hyperlipidemia   . Hypertension   . Internal hemorrhoids without mention of complication   . Ischemic colitis ([place])   . 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 Myelodysplastic syndrome ([place]) 05/22/2013   With low hemoglobin and platelets treated with Procrit  . Osteopenia   . Peptic ulcer    S/p partial gastrectomy in 1969  . Peripheral artery disease ([place])   . Pneumonia 01/16/2016  . Pneumonia 03/2016  . Presence of permanent cardiac pacemaker   . Prostate cancer ([place]) 1997   XRT and lupron  . Renal artery stenosis ([place])   . Sick sinus syndrome ([place])   . Vitamin B 12 deficiency     Past Surgical History:  Past Surgical History:  Procedure Laterality Date  . ABDOMINAL ANGIOGRAM N/A 05/25/2011   Procedure: ABDOMINAL ANGIOGRAM;  Surgeon: Serafina Mitchell, MD;  Location[facility] CATH LAB;  Service: Cardiovascular;  Laterality: N/A;  . ABDOMINAL ANGIOGRAM N/A 02/13/2013   Procedure: ABDOMINAL ANGIOGRAM;  Surgeon: Serafina Mitchell, MD;  Location: [facility] CATH LAB;  Service: Cardiovascular;  Laterality: N/A;  . APPENDECTOMY  1991  . CELIAC ARTERY ANGIOPLASTY  05-16-12   and stenting  . CHOLECYSTECTOMY  Oct 2009   Laparoscopic  . CORONARY ARTERY BYPASS GRAFT  01/22/1993  . CORONARY ARTERY BYPASS GRAFT  01/03/2012   Procedure: REDO CORONARY ARTERY BYPASS GRAFTING (CABG);  Surgeon: Gaye Pollack, MD;  Location: [place];  Service: Open Heart Surgery;  Laterality: N/A;  Redo CABG x  using bilateral internal mammary arteries;  left leg greater saphenous vein harvested endoscopically  . FOREIGN BODY REMOVAL ABDOMINAL Right 01/27/2016   Procedure: EXPOSURE OF RIGHT COMMON FEMORAL ARTERY AND REMOVAL FOREIGN;  Surgeon: Evans Lance, MD;  Location: [place];  Service: Cardiovascular;  Laterality: Right;  . HIATAL HERNIA REPAIR     and ulcer repair  . I&D  EXTREMITY Left 01/21/2016   Procedure: IRRIGATION AND DEBRIDEMENT EXTREMITY;  Surgeon: Milly Jakob, MD;  Location: [place];  Service: Orthopedics;  Laterality: Left;  . INGUINAL HERNIA REPAIR Right 10/28/2015   Procedure: OPEN RIGHT INGUINAL HERNIA REPAIR;  Surgeon: Greer Pickerel, MD;  Location: [facility];  Service: General;  Laterality: Right;  . INSERTION OF MESH Right 10/28/2015   Procedure: INSERTION OF MESH;  Surgeon: Greer Pickerel, MD;  Location: [facility];  Service: General;  Laterality: Right;  . IR GENERIC HISTORICAL  02/02/2016   IR FLUORO GUIDE CV LINE LEFT 02/02/2016 Markus Daft, MD [facility]  . IR GENERIC HISTORICAL  02/02/2016   IR US GUIDE VASC ACCESS LEFT 02/02/2016 Markus Daft, MD [facility]  . IR GENERIC HISTORICAL  03/31/2016   IR REMOVAL TUN CV CATH W/O FL 03/31/2016 Arne Cleveland, MD [facility]  . LEFT HEART CATHETERIZATION WITH CORONARY/GRAFT ANGIOGRAM  12/24/2011   Procedure: LEFT HEART CATHETERIZATION WITH Beatrix Fetters;  Surgeon: Sinclair Grooms, MD;  Location: [facility] CATH LAB;  Service: Cardiovascular;;  . LOWER EXTREMITY ANGIOGRAM Bilateral 05/25/2011   Procedure: LOWER EXTREMITY ANGIOGRAM;  Surgeon: Serafina Mitchell, MD;  Location: [facility] CATH LAB;  Service: Cardiovascular;  Laterality: Bilateral;  . OTHER SURGICAL HISTORY  02/13/13   superior mesenteric artery angiogram  . OTHER SURGICAL HISTORY  05/16/12   Stent in stomach  . PACEMAKER GENERATOR CHANGE  12/10/2003   SJM Identity XL DR performed by Dr Leonia Reeves  . PACEMAKER INSERTION  10/18/1994   DDD pacemaker, [employer]. Gen change 12/10/2003.  Marland Kitchen PACEMAKER LEAD REMOVAL Right 01/27/2016   Procedure: PACEMAKER EXTRACTION;  Surgeon: Evans Lance, MD;  Location: [place];  Service: Cardiovascular;  Laterality: Right;  DR. Roxy Manns TO BACKUP CASE  . PARTIAL GASTRECTOMY  1969   Hx of ulcer s/p partial gastrectomy/ has pernicious anemia  . RENAL ANGIOGRAM N/A 02/13/2013   Procedure: RENAL ANGIOGRAM;  Surgeon: Serafina Mitchell, MD;   Location: [facility] CATH LAB;  Service: Cardiovascular;  Laterality: N/A;  . TEE WITHOUT CARDIOVERSION N/A 01/26/2016   Procedure: TRANSESOPHAGEAL ECHOCARDIOGRAM (TEE);  Surgeon: Sueanne Margarita, MD;  Location: [facility] ENDOSCOPY;  Service: Cardiovascular;  Laterality: N/A;  . TEE WITHOUT CARDIOVERSION N/A 01/27/2016   Procedure: TRANSESOPHAGEAL ECHOCARDIOGRAM (TEE);  Surgeon: Evans Lance, MD;  Location: [place];  Service: Cardiovascular;  Laterality: N/A;  . TEE WITHOUT CARDIOVERSION N/A 04/12/2016   Procedure: TRANSESOPHAGEAL ECHOCARDIOGRAM (TEE);  Surgeon: Dorothy Spark, MD;  Location: [place];  Service: Cardiovascular;  Laterality: N/A;  . THROMBECTOMY / EMBOLECTOMY SUBCLAVIAN ARTERY  02/02/10   Right subclavian thromboectomy and venous angioplasty, and chronic mesenteric ischemia with Herculink stenting to superior mesenteric and celiac arteries - Dr. Trula Slade  . TRANSURETHRAL RESECTION OF PROSTATE N/A 02/22/2016   Procedure: CYSTO, CLOT EVACUATION, FULGERATION;  Surgeon: Alexis Frock, MD;  Location: [facility];  Service: Urology;  Laterality: N/A;  . VISCERAL ANGIOGRAM  N/A 05/25/2011   Procedure: VISCERAL ANGIOGRAM;  Surgeon: Serafina Mitchell, MD;  Location: [facility] CATH LAB;  Service: Cardiovascular;  Laterality: N/A;  . VISCERAL ANGIOGRAM Bilateral 12/28/2011   Procedure: VISCERAL ANGIOGRAM;  Surgeon: Serafina Mitchell, MD;  Location: [facility] CATH LAB;  Service: Cardiovascular;  Laterality: Bilateral;  . VISCERAL ANGIOGRAM N/A 05/16/2012   Procedure: VISCERAL ANGIOGRAM;  Surgeon: Serafina Mitchell, MD;  Location: [facility] CATH LAB;  Service: Cardiovascular;  Laterality: N/A;  . VISCERAL ANGIOGRAM N/A 02/13/2013   Procedure: VISCERAL ANGIOGRAM;  Surgeon: Serafina Mitchell, MD;  Location: [facility] CATH LAB;  Service: Cardiovascular;  Laterality: N/A;    Medication: Current Facility-Administered Medications  Medication Dose Route Frequency Provider Last Rate Last Dose  . 0.9 %  sodium chloride infusion  250 mL Intravenous PRN Lily Kocher, MD      . acetaminophen (TYLENOL) tablet 650 mg  650 mg Oral Q4H PRN Lily Kocher, MD      . amiodarone (PACERONE) tablet 100 mg  100 mg Oral Daily Lily Kocher, MD   100 mg at 06/26/16 0907  . atorvastatin (LIPITOR) tablet 80 mg  80 mg Oral QHS Lily Kocher, MD   80 mg at 06/25/16 2200  . carvedilol (COREG) tablet 25 mg  25 mg Oral BID WC Lily Kocher, MD   25 mg at 06/26/16 0908  . ceFEPIme (MAXIPIME) 1 g in dextrose 5 % 50 mL IVPB  1 g Intravenous Q24H Minda Ditto, RPH   1 g at 06/25/16 2200  . furosemide (LASIX) injection 80 mg  80 mg Intravenous Q12H Lily Kocher, MD   80 mg at 06/26/16 0539  . hydrALAZINE (APRESOLINE) tablet 50 mg  50 mg Oral Q8H Reyne Dumas, MD   50 mg at 06/26/16 0539  . HYDROcodone-acetaminophen (NORCO) 7.5-325 MG per tablet 1 tablet  1 tablet Oral Q6H PRN Lily Kocher, MD      . HYDROmorphone (DILAUDID) injection 0.5 mg  0.5 mg Intravenous Q3H PRN Lily Kocher, MD   0.5 mg at 06/26/16 0919  . isosorbide mononitrate (IMDUR) 24 hr tablet 120 mg  120 mg Oral Daily Lily Kocher, MD   120 mg at 06/26/16 0908  . levothyroxine (SYNTHROID, LEVOTHROID) tablet 50 mcg  50 mcg Oral QAC breakfast Lily Kocher, MD   50 mcg at 06/26/16 0909  . ondansetron (ZOFRAN) injection 4 mg  4 mg Intravenous Q6H PRN Lily Kocher, MD      . potassium chloride SA (K-DUR,KLOR-CON) CR tablet 20 mEq  20 mEq Oral BID Lily Kocher, MD   20 mEq at 06/26/16 0908  . sodium chloride flush (NS) 0.9 % injection 3 mL  3 mL Intravenous Q12H Lily Kocher, MD   3 mL at 06/25/16 1050  . sodium chloride flush (NS) 0.9 % injection 3 mL  3 mL Intravenous PRN Lily Kocher, MD      . tamsulosin [facility]) capsule 0.4 mg  0.4 mg Oral QPC supper Lily Kocher, MD   0.4 mg at 06/25/16 1839  . Warfarin - Pharmacist Dosing Inpatient   Does not apply [place], [facility]        Allergies: Allergies  Allergen Reactions  . Nsaids Nausea Only    Reaction:  GI upset   . Ibuprofen Other (See Comments)     Reaction:  GI upset   . Ace Inhibitors Cough    Social History: Social History  Substance Use Topics  . Smoking status: Former Smoker  Years: 20.00    Types: Cigarettes    Quit date: 07/12/1969  . Smokeless tobacco: Never Used  . Alcohol use No    Family History Family History  Problem Relation Age of Onset  . Diabetes Mother   . Heart disease Mother     Heart Disease before age 69  . Hyperlipidemia Mother   . Hypertension Mother   . CVA Mother 61    cause of death  . Cancer Father     stomach/liver  . Hypertension Father     possibly hypertensive  . Cancer Sister     Breast cancer  . CAD Brother   . Heart disease Brother   . Cancer Paternal Uncle     colon  . Heart disease Sister   . Diabetes Sister   . Hypertension Sister   . Heart disease Daughter     Heart Disease before age 28  . Hypertension Daughter   . Heart attack Daughter     Review of Systems 10 systems were reviewed and are negative except as noted specifically in the HPI.  Objective: Vital signs in last 24 hours: BP (!) 182/65 (BP Location: Right Arm)   Pulse 64   Temp 98 F (36.7 C) (Oral)   Resp 18   Ht 5\' 10"  (1.778 m)   Wt 79.3 kg (174 lb 13.2 oz)   SpO2 98%   BMI 25.08 kg/m   Intake/Output last 3 shifts: I/O last 3 completed shifts: In: 37 [I.V.:3; IV Piggyback:50] Out: 50 [Urine:50]  Physical Exam General: NAD, A&O, resting, appropriate HEENT: [place]/AT, EOMI, MMM Pulmonary: Normal work of breathing Cardiovascular: Regular rate, HDS Abdomen: soft, NTTP, nondistended, no suprapubic fullness or tenderness GU: Condom cath with visible yellow urine, right CVA tenderness Extremities: warm and well perfused Neuro: Appropriate, no focal neurological deficits  Most Recent Labs: Lab Results  Component Value Date   WBC 20.4 (H) 06/26/2016   HGB 9.1 (L) 06/26/2016   HCT 27.5 (L) 06/26/2016   PLT 196 06/26/2016    Lab Results  Component Value Date   NA 136 06/26/2016   K 4.0  06/26/2016   CL 106 06/26/2016   CO2 20 (L) 06/26/2016   BUN 56 (H) 06/26/2016   CREATININE 3.34 (H) 06/26/2016   CALCIUM 8.4 (L) 06/26/2016   MG 1.9 06/24/2016   PHOS 4.1 02/03/2016    Lab Results  Component Value Date   ALKPHOS 52 06/24/2016   BILITOT 0.5 06/24/2016   BILIDIR 0.1 11/07/2015   PROT 8.2 (H) 06/24/2016   ALBUMIN 2.9 (L) 06/24/2016   ALT 9 (L) 06/24/2016   AST 19 06/24/2016    Lab Results  Component Value Date   INR 3.71 06/26/2016   APTT 39 (H) 05/25/2016     Urine Culture: 12/15: Pseudomonas, sensitivities pending   IMAGING: Dg Chest 2 View  Result Date: 06/24/2016 CLINICAL DATA:  Weakness, recent endocarditis EXAM: CHEST  2 VIEW COMPARISON:  05/25/2016 FINDINGS: Cardiomediastinal silhouette is stable. Status post CABG. Atherosclerotic calcifications of thoracic aorta. Bilateral small pleural effusion. Streaky bilateral basilar atelectasis or infiltrate right greater than left. No pulmonary edema. IMPRESSION: Status post CABG. Atherosclerotic calcifications of thoracic aorta. Bilateral small pleural effusion. Streaky bilateral basilar atelectasis or infiltrate right greater than left. No pulmonary edema. Electronically Signed   By: Lahoma Crocker M.D.   On: 06/24/2016 15:05   Dg Ribs Unilateral Right  Result Date: 06/24/2016 CLINICAL DATA:  80 year old male with fall and a right posterior rib  pain. EXAM: RIGHT RIBS - 2 VIEW COMPARISON:  Chest radiograph dated 06/24/2016 and chest radiograph dated 04/08/2016 FINDINGS: There is an age indeterminate nondisplaced fracture of the posterolateral aspect of the right ninth rib. The bones are osteopenic which limits evaluation for fracture. No other fracture identified. There is a small right pleural effusion. Right lung base opacity may represent atelectatic changes versus infiltrate. Trace fluid noted in the minor fissure. There is cardiomegaly. Median sternotomy wires and CABG surgical clips noted. There is  atherosclerotic calcification of the aorta. There is degenerative changes of the spine. Right upper quadrant cholecystectomy clips noted. IMPRESSION: Nondisplaced fracture of the posterior aspect of the right ninth rib, age indeterminate. No pneumothorax. Small right pleural effusion with right lung base atelectasis versus infiltrate. Electronically Signed   By: Anner Crete M.D.   On: 06/24/2016 23:28   Mr Brain Wo Contrast  Result Date: 06/24/2016 CLINICAL DATA:  LEFT lower extremity weakness. History of prostate cancer, diabetes. EXAM: MRI HEAD WITHOUT CONTRAST TECHNIQUE: Multiplanar, multiecho pulse sequences of the brain and surrounding structures were obtained without intravenous contrast. COMPARISON:  CT HEAD February 20, 2016 FINDINGS: BRAIN: No reduced diffusion to suggest acute ischemia or hypercellular tumor. Punctate chronic microhemorrhage LEFT parietal lobe. The ventricles and sulci are normal for patient's age. Patchy to confluent supratentorial white matter FLAIR T2 hyperintensities. No suspicious parenchymal signal, masses or mass effect. Multiple small cortical infarcts. Prominent symmetric perivascular spaces, normal variant. No abnormal extra-axial fluid collections. No extra-axial masses though, contrast enhanced sequences would be more sensitive. VASCULAR: Normal major intracranial vascular flow voids present at skull base. SKULL AND UPPER CERVICAL SPINE: No abnormal sellar expansion. No suspicious calvarial bone marrow signal. Craniocervical junction maintained. Trace atlantodental fusion. SINUSES/ORBITS: Lobulated LEFT maxillary sinus mucosal thickening on, trace remaining paranasal sinus mucosal thickening. Trace mastoid effusions. The included ocular globes and orbital contents are non-suspicious. OTHER: None. IMPRESSION: No acute intracranial process. Involutional changes. Moderate chronic small vessel ischemic disease. Electronically Signed   By: Elon Alas M.D.   On:  06/24/2016 21:16   Mr Lumbar Spine Wo Contrast  Result Date: 06/25/2016 CLINICAL DATA:  80 year old male with generalized weakness for 3 days. Lumbar back pain after a fall. Leg weakness. Initial encounter. EXAM: MRI LUMBAR SPINE WITHOUT CONTRAST TECHNIQUE: Multiplanar, multisequence MR imaging of the lumbar spine was performed. No intravenous contrast was administered. COMPARISON:  CT Abdomen and Pelvis 04/08/2016 FINDINGS: MRI imaging quality of the lumbar spine is significantly degraded by generalize loss of signal despite repeated imaging attempts. The etiology of the signal loss seems to be generalized soft tissue edema such as anasarca. Segmentation: Normal as seen on the comparison CT Abdomen and Pelvis. Alignment: Stable since the prior CT. Mild grade 1 anterolisthesis of L5 on S1. Mild dextroconvex lumbar scoliosis. Vertebrae: Suboptimal characterization of marrow due to the decreased signal to noise. No definite marrow edema or acute osseous abnormality. Conus medullaris: Extends to the L1 level and appears normal. Paraspinal and other soft tissues: Diffuse soft tissue edema suspected such is anasarca. This also seems to affect the visible paraspinal muscles bilaterally. Very limited visualization of abdominal viscera. The visualized kidneys appear stable to the prior CT. Disc levels: No significant lumbar spinal stenosis. Mild lumbar spine degeneration above L4. At L4-L5 there is disc bulging and moderate to severe facet and ligament flavum hypertrophy. Both facet joints are capacious and contain fluid. Small posteriorly situated synovial cyst on the left. No significant stenosis. Chronic mild anterolisthesis at L5-S1 with  circumferential disc bulge and moderate to severe facet hypertrophy. No spinal stenosis. Up to mild lateral recess stenosis and bilateral L5 foraminal stenosis. IMPRESSION: 1. MRI of the lumbar spine significantly degraded by signal loss despite repeated imaging attempts, which  seems to be due to generalized body soft tissue edema such as due to anasarca. 2. Given #1, diffuse lumbar Myositis is difficult to exclude. No acute osseous abnormality identified. 3. No significant lumbar spinal stenosis. Spinal degeneration primarily only at L4-L5 and L5-S1 where there is facet arthropathy in part related to mild spondylolisthesis. Subsequent up to mild multifactorial lateral recess and foraminal stenosis. Electronically Signed   By: Genevie Ann M.D.   On: 06/25/2016 10:29   US Renal  Result Date: 06/26/2016 CLINICAL DATA:  Acute kidney failure EXAM: RENAL / URINARY TRACT ULTRASOUND COMPLETE COMPARISON:  None. FINDINGS: Right Kidney: Length: 10.6 cm. 1.6 cm cyst in the upper pole. Moderate hydronephrosis. Diffusely increased echotexture throughout the right kidney. Left Kidney: Length: 10.1 cm. Increased echotexture throughout the left kidney. No hydronephrosis or mass. Bladder: Layering debris dependently in the bladder. Mild bladder wall irregularity and thickening. IMPRESSION: Moderate right hydronephrosis. Increased echotexture within the kidneys bilaterally compatible with chronic medical renal disease. Irregular wall thickening throughout the bladder. While this could be related to bladder outlet obstruction, I cannot exclude infection or neoplasm. Recommend clinical correlation. Electronically Signed   By: Rolm Baptise M.D.   On: 06/26/2016 10:50     Assessment: Patient is a 80 y.o. male with multiple medical problems, GU history including BPH and prostate cancer s/p XRT with resulting radiation cystitis. He has had multiple prior episodes with gross hematuria but luckily has had no issues even since starting Coumadin for DVT. He is admitted for hypertension with history of fall and right flank pain. His creatinine was elevated this morning. He has many reasons to have AKI, including poor PO intake, hypertensive crisis precipitating ATN, and obstruction. Unfortunately FeNA not very  reliable for this patient given baseline CKD and lasix therapy. A renal ultrasound today shows new right hydronephrosis. He is voiding into a condom cath with low PVR (186cc per nursing) thus do not suspect bladder outlet obstruction as cause. Urine is clear yellow. Culture demonstrates Pseudomonas for which he is on antibiotics.  Given history of prostate cancer, history of fall, and new hydronephrosis with flank pain, would evaluate cause of hydronephrosis with CT.  Recommendations: 1. CT abdomen/pelvis wo contrast. Will review scan to determine potential need for operative intervention, eg ureteral stent. 2. Continue antibiotics per primary team. Follow-up urine culture sensitivities. 3. No indication for indwelling foley catheter at this time. Continue to monitor with bladder scan as needed. 4. If no cause for obstruction on CT, would consider evaluation by nephrology.   Thank you for this consult. Please do not hesitate to contact us with any further questions/concerns.  Lorayne Bender, MD PGY4 Urology Resident  I have interviewed and examined the pt as well as reviewed the pt's history. I agree with the above assessment. He does have a possible high pressure bladder with diverticulae formation. Will place foley to see if this helps w/ possible resolution of rt hydro if BOO is causative factor.

## 2016-06-26 NOTE — Clinical Social Work Note (Signed)
MSW attempted to meet with patient at bedside in regards to discharge planning. RN assisting patient at bedside. MSW also attempted to contact patient's wife, [name] and unable to reach.   MSW will attempt to visit patient at a later time.   Glendon Axe, MSW [phone] 06/26/2016 1:43 PM

## 2016-06-26 NOTE — Progress Notes (Signed)
[place] for Warfarin Indication: Atrial fibrillation, hx of DVT  Allergies  Allergen Reactions  . Nsaids Nausea Only    Reaction:  GI upset   . Ibuprofen Other (See Comments)    Reaction:  GI upset   . Ace Inhibitors Cough   Patient Measurements: Height: 5\' 10"  (177.8 cm) Weight: 174 lb 13.2 oz (79.3 kg) IBW/kg (Calculated) : 73  Vital Signs: Temp: 98 F (36.7 C) (12/16 0536) Temp Source: Oral (12/16 0536) BP: 182/65 (12/16 0536) Pulse Rate: 64 (12/16 0536)  Labs:  Recent Labs  06/24/16 1556 06/24/16 1606 06/25/16 0340 06/26/16 0507  HGB 10.8* 12.2*  --  9.1*  HCT 32.4* 36.0*  --  27.5*  PLT 221  --   --  196  LABPROT 27.1*  --  28.9* 37.7*  INR 2.45  --  2.67 3.71  CREATININE 2.79* 2.70* 2.64* 3.34*   Estimated Creatinine Clearance: 18.8 mL/min (by C-G formula based on SCr of 3.34 mg/dL (H)).  Medications:  Scheduled:  . amiodarone  100 mg Oral Daily  . atorvastatin  80 mg Oral QHS  . carvedilol  25 mg Oral BID WC  . ceFEPime (MAXIPIME) IV  1 g Intravenous Q24H  . furosemide  80 mg Intravenous Q12H  . hydrALAZINE  50 mg Oral Q8H  . isosorbide mononitrate  120 mg Oral Daily  . levothyroxine  50 mcg Oral QAC breakfast  . potassium chloride SA  20 mEq Oral BID  . sodium chloride flush  3 mL Intravenous Q12H  . tamsulosin  0.4 mg Oral QPC supper  . Warfarin - Pharmacist Dosing Inpatient   Does not apply q1800   Assessment: 38 yoM to ED 12/14 with weakness, fall at home 3 days PTA. Admit INR 2.45 - Patient stated home regimen is 12mg  daily - Coag clinic visit 12/12 with INR 3.7, hold Warf 12/13 then resume 6mg  daily    Today, 06/26/2016  INR now supratherapeutic after doses of 6mg  and 4mg  as inpatient  CBC: Hgb low and dropped. Plts stable  Drug interactions: no new significant interactions (amiodarone is home med and regimen already adjusted for this)  Note patient stated home dose 12mg  daily vs coumadin  clinic notes to be taking 6mg  daily (as on 12/12)  Goal of Therapy:  INR 2-3 Monitor platelets by anticoagulation protocol: Yes   Plan:   No warfarin today  Daily INR   Adrian Saran, PharmD, BCPS Pager [phone] 06/26/2016 10:41 AM

## 2016-06-27 DIAGNOSIS — N1330 Unspecified hydronephrosis: Secondary | ICD-10-CM

## 2016-06-27 DIAGNOSIS — I10 Essential (primary) hypertension: Secondary | ICD-10-CM

## 2016-06-27 DIAGNOSIS — K529 Noninfective gastroenteritis and colitis, unspecified: Secondary | ICD-10-CM

## 2016-06-27 DIAGNOSIS — N179 Acute kidney failure, unspecified: Secondary | ICD-10-CM

## 2016-06-27 DIAGNOSIS — I480 Paroxysmal atrial fibrillation: Secondary | ICD-10-CM

## 2016-06-27 LAB — GASTROINTESTINAL PANEL BY PCR, STOOL (REPLACES STOOL CULTURE)
ADENOVIRUS F40/41: NOT DETECTED
ASTROVIRUS: NOT DETECTED
CAMPYLOBACTER SPECIES: NOT DETECTED
CYCLOSPORA CAYETANENSIS: NOT DETECTED
Cryptosporidium: NOT DETECTED
ENTEROAGGREGATIVE E COLI (EAEC): NOT DETECTED
ENTEROPATHOGENIC E COLI (EPEC): NOT DETECTED
ENTEROTOXIGENIC E COLI (ETEC): NOT DETECTED
Entamoeba histolytica: NOT DETECTED
GIARDIA LAMBLIA: NOT DETECTED
Norovirus GI/GII: NOT DETECTED
PLESIMONAS SHIGELLOIDES: NOT DETECTED
ROTAVIRUS A: NOT DETECTED
Salmonella species: NOT DETECTED
Sapovirus (I, II, IV, and V): NOT DETECTED
Shiga like toxin producing E coli (STEC): NOT DETECTED
Shigella/Enteroinvasive E coli (EIEC): NOT DETECTED
VIBRIO SPECIES: NOT DETECTED
Vibrio cholerae: NOT DETECTED
Yersinia enterocolitica: NOT DETECTED

## 2016-06-27 LAB — URINE CULTURE: Culture: >=100000 — AB

## 2016-06-27 LAB — CBC
HCT: 29.5 % — ABNORMAL LOW (ref 39.0–52.0)
Hemoglobin: 9.7 g/dL — ABNORMAL LOW (ref 13.0–17.0)
MCH: 29.5 pg (ref 26.0–34.0)
MCHC: 32.9 g/dL (ref 30.0–36.0)
MCV: 89.7 fL (ref 78.0–100.0)
PLATELETS: 214 10*3/uL (ref 150–400)
RBC: 3.29 MIL/uL — AB (ref 4.22–5.81)
RDW: 19.2 % — AB (ref 11.5–15.5)
WBC: 25.3 10*3/uL — AB (ref 4.0–10.5)

## 2016-06-27 LAB — C DIFFICILE QUICK SCREEN W PCR REFLEX
C DIFFICILE (CDIFF) INTERP: DETECTED
C DIFFICLE (CDIFF) ANTIGEN: POSITIVE — AB
C Diff toxin: POSITIVE — AB

## 2016-06-27 LAB — CBC AND DIFFERENTIAL
HEMATOCRIT: 30 % — AB (ref 41–53)
Hemoglobin: 9.7 g/dL — AB (ref 13.5–17.5)
Platelets: 214 10*3/uL (ref 150–399)
WBC: 25.3 10*3/mL

## 2016-06-27 LAB — PROTIME-INR
INR: 3.92
PROTHROMBIN TIME: 39.4 s — AB (ref 11.4–15.2)

## 2016-06-27 LAB — BASIC METABOLIC PANEL
ANION GAP: 11 (ref 5–15)
BUN: 58 mg/dL — AB (ref 4–21)
BUN: 58 mg/dL — ABNORMAL HIGH (ref 6–20)
CO2: 19 mmol/L — ABNORMAL LOW (ref 22–32)
Calcium: 8.3 mg/dL — ABNORMAL LOW (ref 8.9–10.3)
Chloride: 104 mmol/L (ref 101–111)
Creatinine, Ser: 3.13 mg/dL — ABNORMAL HIGH (ref 0.61–1.24)
Creatinine: 3.1 mg/dL — AB (ref 0.6–1.3)
GFR, EST AFRICAN AMERICAN: 20 mL/min — AB (ref >60)
GFR, EST NON AFRICAN AMERICAN: 18 mL/min — AB (ref >60)
GLUCOSE: 115 mg/dL
Glucose, Bld: 115 mg/dL — ABNORMAL HIGH (ref 65–99)
POTASSIUM: 3.3 mmol/L — AB (ref 3.5–5.1)
Potassium: 3.3 mmol/L — AB (ref 3.4–5.3)
SODIUM: 134 mmol/L — AB (ref 137–147)
SODIUM: 134 mmol/L — AB (ref 135–145)

## 2016-06-27 MED ORDER — HYDRALAZINE HCL 50 MG PO TABS
75.0000 mg | ORAL_TABLET | Freq: Three times a day (TID) | ORAL | Status: DC
  Administered 2016-06-27 – 2016-06-29 (×6): 75 mg via ORAL
  Filled 2016-06-27 (×7): qty 1

## 2016-06-27 MED ORDER — VANCOMYCIN 50 MG/ML ORAL SOLUTION
500.0000 mg | Freq: Four times a day (QID) | ORAL | Status: DC
  Administered 2016-06-27 – 2016-07-01 (×17): 500 mg via ORAL
  Filled 2016-06-27 (×18): qty 10

## 2016-06-27 MED ORDER — PIPERACILLIN-TAZOBACTAM 3.375 G IVPB
3.3750 g | Freq: Three times a day (TID) | INTRAVENOUS | Status: DC
  Administered 2016-06-27 – 2016-07-01 (×13): 3.375 g via INTRAVENOUS
  Filled 2016-06-27 (×12): qty 50

## 2016-06-27 MED ORDER — POTASSIUM CHLORIDE CRYS ER 20 MEQ PO TBCR: 40.0000 meq | EXTENDED_RELEASE_TABLET | Freq: Once | ORAL | Status: DC

## 2016-06-27 MED ORDER — METRONIDAZOLE 500 MG PO TABS
500.0000 mg | ORAL_TABLET | Freq: Three times a day (TID) | ORAL | Status: DC
  Administered 2016-06-27: 500 mg via ORAL
  Filled 2016-06-27: qty 1

## 2016-06-27 MED ORDER — POTASSIUM CHLORIDE CRYS ER 20 MEQ PO TBCR
40.0000 meq | EXTENDED_RELEASE_TABLET | Freq: Once | ORAL | Status: AC
  Administered 2016-06-27: 40 meq via ORAL
  Filled 2016-06-27: qty 2

## 2016-06-27 NOTE — Clinical Social Work Note (Signed)
Clinical Social Work Assessment  Patient Details  Name: [name] PINKETT Sr. MRN: [id number] Date of Birth: June 21, 1936  Date of referral:  06/27/16               Reason for consult:  Facility Placement, Discharge Planning                Permission sought to share information with:  Family Supports, Case Freight forwarder, Chartered certified accountant granted to share information::  Yes, Verbal Permission Granted  Name::      ([name] Bobbye [place] and Hendricks Milo )  Agency::   (SNF's )  Relationship::   (Spouse and Dtr )  Contact Information:   [phone])  Housing/Transportation Living arrangements for the past 2 months:  Single Family Home Source of Information:  Spouse, Adult Children Patient Interpreter Needed:  None Criminal Activity/Legal Involvement Pertinent to Current Situation/Hospitalization:  No - Comment as needed Significant Relationships:  Adult Children, Spouse Lives with:  Spouse Do you feel safe going back to the place where you live?  No Need for family participation in patient care:  No (Coment)  Care giving concerns:  Patient admitted from home with spouse. Family prefers SNF placement.    Social Worker assessment / plan:  MSW spoke with pt's spouse and dtr, [name] in reference to post-acute placement for SNF. MSW introduced MSW role and SNF process. Patient's family familiar with process as patient has been at [facility] from July-Sept 2017. Patient was hospitalized from [facility] and returned home at [facility] in Oct 2017. Family is interested in [facility] and has been on tour earlier this week. Other bed offers given to wife and dtr however decision was not made on 2nd option in the event [name] is unavailable.  No further concerns reported at this time. MSW remains available as needed.   MSW reviewed chart and noted that patient's wife reported during last admission that patient is now in co-pay days and has a balance at [facility], dtr reported that she  is unaware of co-pays days or a balance. Weekday CSW to follow up.   Employment status:  Retired Nurse, adult PT Recommendations:  [place] / Referral to community resources:  [place]  Patient/Family's Response to care: Patient and family agreeable to SNF placement. Pt supportive, pleasant and strongly involved in care.   Patient/Family's Understanding of and Emotional Response to Diagnosis, Current Treatment, and Prognosis:  Patient's family knowledgeable of medical interventions and on-going treatments.   Emotional Assessment Appearance:  Appears stated age Attitude/Demeanor/Rapport:  Unable to Assess Affect (typically observed):  Unable to Assess Orientation:  Oriented to Situation, Oriented to  Time, Oriented to Place, Oriented to Self Alcohol / Substance use:  Not Applicable Psych involvement (Current and /or in the community):  No (Comment)  Discharge Needs  Concerns to be addressed:  Care Coordination Readmission within the last 30 days:  Yes Current discharge risk:  Dependent with Mobility Barriers to Discharge:  Continued Medical Work up   Glendon Axe A 06/27/2016, 4:33 PM

## 2016-06-27 NOTE — Progress Notes (Signed)
[facility] for Warfarin Indication: Atrial fibrillation, hx of DVT  Allergies  Allergen Reactions  . Nsaids Nausea Only    Reaction:  GI upset   . Ibuprofen Other (See Comments)    Reaction:  GI upset   . Ace Inhibitors Cough   Patient Measurements: Height: 5\' 10"  (177.8 cm) Weight: 174 lb 13.2 oz (79.3 kg) IBW/kg (Calculated) : 73  Vital Signs: Temp: 99.2 F (37.3 C) (12/17 0657) Temp Source: Oral (12/17 0657) BP: 199/71 (12/17 0657) Pulse Rate: 85 (12/17 0657)  Labs:  Recent Labs  06/24/16 1556 06/24/16 1606 06/25/16 0340 06/26/16 0507 06/27/16 0543 06/27/16 0756  HGB 10.8* 12.2*  --  9.1*  --  9.7*  HCT 32.4* 36.0*  --  27.5*  --  29.5*  PLT 221  --   --  196  --  214  LABPROT 27.1*  --  28.9* 37.7* 39.4*  --   INR 2.45  --  2.67 3.71 3.92  --   CREATININE 2.79* 2.70* 2.64* 3.34* 3.13*  --    Estimated Creatinine Clearance: 20.1 mL/min (by C-G formula based on SCr of 3.13 mg/dL (H)).  Medications:  Scheduled:  . amiodarone  100 mg Oral Daily  . atorvastatin  80 mg Oral QHS  . carvedilol  25 mg Oral BID WC  . furosemide  40 mg Intravenous Q12H  . hydrALAZINE  75 mg Oral Q8H  . isosorbide mononitrate  120 mg Oral Daily  . levothyroxine  50 mcg Oral QAC breakfast  . piperacillin-tazobactam (ZOSYN)  IV  3.375 g Intravenous Q8H  . potassium chloride SA  20 mEq Oral BID  . potassium chloride  40 mEq Oral Once  . sodium chloride flush  3 mL Intravenous Q12H  . tamsulosin  0.4 mg Oral QPC supper  . vancomycin  500 mg Oral Q6H  . Warfarin - Pharmacist Dosing Inpatient   Does not apply q1800   Assessment: 61 yoM to ED 12/14 with weakness, fall at home 3 days PTA. Admit INR 2.45 - Patient stated home regimen is 12mg  daily - Coag clinic visit 12/12 with INR 3.7, hold Warf 12/13 then resume 6mg  daily    Today, 06/27/2016  INR still supratherapeutic after doses of 6mg  and 4mg  as inpatient and no dose yesterday  12/16  CBC: Hgb low and dropped. Plts stable  Drug interactions: no new significant interactions (amiodarone is home med and regimen already adjusted for this)  Note patient stated home dose 12mg  daily vs coumadin clinic notes to be taking 6mg  daily (as on 12/12)  Goal of Therapy:  INR 2-3 Monitor platelets by anticoagulation protocol: Yes   Plan:   Continue to hold warfarin today  Daily INR   Adrian Saran, PharmD, BCPS Pager [phone] 06/27/2016 9:44 AM

## 2016-06-27 NOTE — Progress Notes (Signed)
Subjective: Patient reports feeling fine. No real c/o flank p[ain this am.  Objective: Vital signs in last 24 hours: Temp:  [98.2 F (36.8 C)-99.2 F (37.3 C)] 99.2 F (37.3 C) (12/17 0657) Pulse Rate:  [62-85] 85 (12/17 0657) Resp:  [18] 18 (12/17 0657) BP: (155-199)/(70-84) 199/71 (12/17 0657) SpO2:  [97 %-99 %] 97 % (12/17 0657)  Intake/Output from previous day: 12/16 0701 - 12/17 0700 In: 1130 [P.O.:1080; IV Piggyback:50] Out: 2150 [Urine:2150] Intake/Output this shift: No intake/output data recorded.  Physical Exam:  Constitutional: Vital signs reviewed. WD WN in NAD   Eyes: PERRL, No scleral icterus.   Cardiovascular: RRR Pulmonary/Chest: Normal effort Abdominal: Soft. Non-tender, non-distended, bowel sounds are normal, no masses, organomegaly, or guarding present.  Anasarca. No Rt CVAT   Lab Results:  Recent Labs  06/24/16 1606 06/26/16 0507 06/27/16 0756  HGB 12.2* 9.1* 9.7*  HCT 36.0* 27.5* 29.5*   BMET  Recent Labs  06/26/16 0507 06/27/16 0543  NA 136 134*  K 4.0 3.3*  CL 106 104  CO2 20* 19*  GLUCOSE 125* 115*  BUN 56* 58*  CREATININE 3.34* 3.13*  CALCIUM 8.4* 8.3*    Recent Labs  06/25/16 0340 06/26/16 0507 06/27/16 0543  INR 2.67 3.71 3.92   No results for input(s): LABURIN in the last 72 hours. Results for orders placed or performed during the hospital encounter of 06/24/16  MRSA PCR Screening     Status: None   Collection Time: 06/24/16 10:41 PM  Result Value Ref Range Status   MRSA by PCR NEGATIVE NEGATIVE Final    Comment:        The GeneXpert MRSA Assay (FDA approved for NASAL specimens only), is one component of a comprehensive MRSA colonization surveillance program. It is not intended to diagnose MRSA infection nor to guide or monitor treatment for MRSA infections.   Culture, Urine     Status: Abnormal   Collection Time: 06/25/16  2:53 AM  Result Value Ref Range Status   Specimen Description URINE, RANDOM  Final    Special Requests NONE  Final   Culture >=100,000 COLONIES/mL PSEUDOMONAS AERUGINOSA (A)  Final   Report Status 06/27/2016 FINAL  Final   Organism ID, Bacteria PSEUDOMONAS AERUGINOSA (A)  Final      Susceptibility   Pseudomonas aeruginosa - MIC*    CEFTAZIDIME 4 SENSITIVE Sensitive     CIPROFLOXACIN <=0.25 SENSITIVE Sensitive     GENTAMICIN <=1 SENSITIVE Sensitive     IMIPENEM 2 SENSITIVE Sensitive     PIP/TAZO <=4 SENSITIVE Sensitive     CEFEPIME 4 SENSITIVE Sensitive     * >=100,000 COLONIES/mL PSEUDOMONAS AERUGINOSA    Studies/Results: Ct Abdomen Pelvis Wo Contrast  Result Date: 06/26/2016 CLINICAL DATA:  Right back pain for the past 7 days. Urinary tract infection. Moderate right hydronephrosis on a renal ultrasound earlier today. EXAM: CT ABDOMEN AND PELVIS WITHOUT CONTRAST TECHNIQUE: Multidetector CT imaging of the abdomen and pelvis was performed following the standard protocol without IV contrast. COMPARISON:  Renal ultrasound earlier today. Abdomen and pelvis CT dated 04/08/2016. FINDINGS: Lower chest: Small bilateral pleural effusions. Mild bilateral lower lobe atelectasis. Hepatobiliary: Liver cysts.  Cholecystectomy clips. Pancreas: Unremarkable. No pancreatic ductal dilatation or surrounding inflammatory changes. Spleen: Normal in size without focal abnormality. Adrenals/Urinary Tract: Moderate dilatation of the right renal collecting system. Tiny upper pole right renal calculus or vascular calcification. No bladder or ureteral calculi a seen. Unremarkable left kidney. Grossly normal adrenal glands. Multiple bladder diverticula. Stomach/Bowel:  Moderate diffuse low density wall thickening involving the colon, including the rectum. Grossly unremarkable stomach and small bowel. Vascular/Lymphatic: Extensive, dense, diffuse arterial calcifications, including the coronary arteries and the abdominal aorta and its branches. No enlarged lymph nodes. Reproductive: Normal sized prostate  containing coarse calcifications. Other: The examination is limited by streak artifacts produced by an electronic device on the left side of the patient and limited by a diffuse edema of the subcutaneous, intraperitoneal and retroperitoneal fat. Musculoskeletal: Mild lower lumbar spine degenerative changes. IMPRESSION: 1. Interval diffuse colitis and proctitis. 2. Interval moderate right hydronephrosis with no visible cause. 3. Severe diffuse atheromatous arterial calcifications, including coronary artery atherosclerosis and aortic atherosclerosis. 4. Progressive diffuse anasarca. 5. Small bilateral pleural effusions. 6. Mild bilateral lower lobe atelectasis. 7. Multiple bladder diverticula. Electronically Signed   By: Claudie Revering M.D.   On: 06/26/2016 18:48   Mr Lumbar Spine Wo Contrast  Result Date: 06/25/2016 CLINICAL DATA:  80 year old male with generalized weakness for 3 days. Lumbar back pain after a fall. Leg weakness. Initial encounter. EXAM: MRI LUMBAR SPINE WITHOUT CONTRAST TECHNIQUE: Multiplanar, multisequence MR imaging of the lumbar spine was performed. No intravenous contrast was administered. COMPARISON:  CT Abdomen and Pelvis 04/08/2016 FINDINGS: MRI imaging quality of the lumbar spine is significantly degraded by generalize loss of signal despite repeated imaging attempts. The etiology of the signal loss seems to be generalized soft tissue edema such as anasarca. Segmentation: Normal as seen on the comparison CT Abdomen and Pelvis. Alignment: Stable since the prior CT. Mild grade 1 anterolisthesis of L5 on S1. Mild dextroconvex lumbar scoliosis. Vertebrae: Suboptimal characterization of marrow due to the decreased signal to noise. No definite marrow edema or acute osseous abnormality. Conus medullaris: Extends to the L1 level and appears normal. Paraspinal and other soft tissues: Diffuse soft tissue edema suspected such is anasarca. This also seems to affect the visible paraspinal muscles  bilaterally. Very limited visualization of abdominal viscera. The visualized kidneys appear stable to the prior CT. Disc levels: No significant lumbar spinal stenosis. Mild lumbar spine degeneration above L4. At L4-L5 there is disc bulging and moderate to severe facet and ligament flavum hypertrophy. Both facet joints are capacious and contain fluid. Small posteriorly situated synovial cyst on the left. No significant stenosis. Chronic mild anterolisthesis at L5-S1 with circumferential disc bulge and moderate to severe facet hypertrophy. No spinal stenosis. Up to mild lateral recess stenosis and bilateral L5 foraminal stenosis. IMPRESSION: 1. MRI of the lumbar spine significantly degraded by signal loss despite repeated imaging attempts, which seems to be due to generalized body soft tissue edema such as due to anasarca. 2. Given #1, diffuse lumbar Myositis is difficult to exclude. No acute osseous abnormality identified. 3. No significant lumbar spinal stenosis. Spinal degeneration primarily only at L4-L5 and L5-S1 where there is facet arthropathy in part related to mild spondylolisthesis. Subsequent up to mild multifactorial lateral recess and foraminal stenosis. Electronically Signed   By: Genevie Ann M.D.   On: 06/25/2016 10:29   US Renal  Result Date: 06/26/2016 CLINICAL DATA:  Acute kidney failure EXAM: RENAL / URINARY TRACT ULTRASOUND COMPLETE COMPARISON:  None. FINDINGS: Right Kidney: Length: 10.6 cm. 1.6 cm cyst in the upper pole. Moderate hydronephrosis. Diffusely increased echotexture throughout the right kidney. Left Kidney: Length: 10.1 cm. Increased echotexture throughout the left kidney. No hydronephrosis or mass. Bladder: Layering debris dependently in the bladder. Mild bladder wall irregularity and thickening. IMPRESSION: Moderate right hydronephrosis. Increased echotexture within the kidneys bilaterally  compatible with chronic medical renal disease. Irregular wall thickening throughout the  bladder. While this could be related to bladder outlet obstruction, I cannot exclude infection or neoplasm. Recommend clinical correlation. Electronically Signed   By: Rolm Baptise M.D.   On: 06/26/2016 10:50    Assessment/Plan:   Mild rt hydro in pt w/ MULTIPLE medical issues. Cr down some. Will leave urethral catheter in for now. Reassess w/ renal U/S in 2-3 days.   LOS: 3 days   Franchot Gallo M 06/27/2016, 8:41 AM

## 2016-06-27 NOTE — Progress Notes (Signed)
PROGRESS NOTE                                                                                                                                                                                                             Patient Demographics:    [name], is a 80 y.o. male, DOB - 1936/06/20, [id number]  Admit date - 06/24/2016   Admitting Physician Lily Kocher, MD  Outpatient Primary MD for the patient is [name], MD  LOS - 3  Outpatient Specialists: cardiology  Chief Complaint  Patient presents with  . Weakness       Brief Narrative   80 year old male with multiple comorbidities including CAD with history of CABG, PCI, PAF/sick sinus syndrome status post PPM (was removed recently due to systemic infection), taken off anticoagulation due to bleeding complications, chronic diastolic CHF, hypertension, diabetes mellitus, CAD stage IV, myelodysplastic syndrome and hypothyroidism was brought to the ED by his wife and daughter with generalized weakness and fall at home 3 days prior to admission. 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 difficile colitis and pseudomonal UTI. He was discharged on Coumadin for DVT. Patient fell at home 3 days prior to admission stating that his legs gave away. No loss of consciousness but landed on his eyes and had pain in his right shoulder and mid to lower back. Denies any bowel or urinary symptoms. Denies chest pain, palpitations, but has increased dyspnea on exertion. Reports good appetite. Family have been looking for SNF options.  In the ED He was hypertensive with systolic blood pressure in 200s. Labs showed WBC of 12, mildly elevated BUN and creatinine (53/2.79), UA positive for UTI. MRI of brain was negative for acute intracranial activity. MRI of the lumbar spine pending. admitted to  stepdown for further management.      Subjective:   Patient reports still having diarrhea. Afebrile.   Assessment  & Plan :    Principal Problem:   Hypertensive crisis Better with pain control. Resumed home blood pressure medications (Coreg, clonidine, hydralazine, Imdur).Increase hydralazine dose.  Active Problems: Leukocytosis with diffuse colitis Patient complaining of diarrhea. CT scan done for right hydronephrosis showing diffuse colitis and? Proctitis. Has some abdominal tenderness. Concern for C. difficile colitis. Stool for C. difficile and GI  pathogen panel sent. Added oral vancomycin.    Acute on CKD (chronic kidney disease), stage IV (HCC) Worsened renal fn with moderate right-sided hydronephrosis seen on making. Hx of hemorrhagic cystitis with bladder clot, previously requiring CBI and foley for several months.  urology appreciated. Foley placed in with good urine output. Recommend to leave Foley catheter for now and monitor. Reevaluate with renal ultrasound in 2-3 days. Renal function slightly improved this morning. Does not appear volume overloaded on exam today. Will hold further Lasix.   Fall with leg weakness and low back pain. MRI negative for stroke. MRI of the lumbar spine without acute injury. ? Myositis.  PT evaluation. Family wanting patient to be go to SNF. Patient agrees as well.    Recurrent Pseudomonas UTI Cefepime switched to Zosyn to cover intra-abdominal infection.  Acute on chronic diastolic CHF Improved with IV Lasix. Will hold further dose since patient is euvolemic. Continue beta blocker and Imdur.     PAF (paroxysmal atrial fibrillation) (HCC) Rate controlled. Continue beta blocker and amiodarone. On warfarin for upper extremity DVT.     Anemia of chronic disease Stable. For Hemoccult positive. Monitor while on anticoagulant is seen.  Low back pain and lower extremity weakness Pain control with when necessary Vicodin and low-dose  Dilaudid. MRI of the back negative for acute injury. PT recommends SNF.   BPH Continue Flomax.  Recent upper extremity DVT On warfarin. Dosing per pharmacy.   CAD with history of CABG (94 and 2013) Follows with Dr. Tamala Julian. Continue beta blocker.  History of PAF/sick sinus syndrome Pacemaker removed recently due to infection. Not on chronic anticoagulation due to bleeding complete dictations.  Hypothyroidism Continue Synthroid.  Dependent sacral pressure ulcer Wound care evaluation.  Code Status : Full code  Family Communication  : Discussed with daughter at bedside on 12/16.  Disposition Plan  : needs SNF  Barriers For Discharge : Active symptoms  Consults  :   urology  Procedures  :  MRI brain MRI lumbar spine  DVT Prophylaxis  :  Coumadin  Lab Results  Component Value Date   PLT 214 06/27/2016    Antibiotics  :   Anti-infectives    Start     Dose/Rate Route Frequency Ordered Stop   06/27/16 1200  vancomycin (VANCOCIN) 50 mg/mL oral solution 500 mg     500 mg Oral Every 6 hours 06/27/16 0938 07/11/16 1159   06/27/16 1000  piperacillin-tazobactam (ZOSYN) IVPB 3.375 g     3.375 g 12.5 mL/hr over 240 Minutes Intravenous Every 8 hours 06/27/16 0943     06/27/16 0800  metroNIDAZOLE (FLAGYL) tablet 500 mg  Status:  Discontinued     500 mg Oral Every 8 hours 06/27/16 0749 06/27/16 0938   06/25/16 2200  ceFEPIme (MAXIPIME) 1 g in dextrose 5 % 50 mL IVPB  Status:  Discontinued     1 g 100 mL/hr over 30 Minutes Intravenous Every 24 hours 06/24/16 2229 06/27/16 0938   06/24/16 2145  ceFEPIme (MAXIPIME) 2 g in dextrose 5 % 50 mL IVPB     2 g 100 mL/hr over 30 Minutes Intravenous  Once 06/24/16 2135 06/25/16 0022        Objective:   Vitals:   06/26/16 1900 06/27/16 0300 06/27/16 0657 06/27/16 1245  BP: (!) 188/70 (!) 163/76 (!) 199/71 (!) 168/59  Pulse: 78 76 85 72  Resp:   18 18  Temp: 98.7 F (37.1 C)  99.2 F (37.3 C) 98.9 F (  37.2 C)  TempSrc: Oral   Oral Oral  SpO2: 99%  97% 98%  Weight:      Height:        Wt Readings from Last 3 Encounters:  06/25/16 79.3 kg (174 lb 13.2 oz)  06/07/16 78.4 kg (172 lb 12.8 oz)  06/03/16 83.1 kg (183 lb 3.2 oz)     Intake/Output Summary (Last 24 hours) at 06/27/16 1322 Last data filed at 06/27/16 0657  Gross per 24 hour  Intake             1010 ml  Output             1900 ml  Net             -890 ml     Physical Exam  Gen: Ears fatigue. HEENT: no pallor, moist mucosa, supple neck Chest: clear b/l, no added sounds CVS: N S1&S2, no murmurs, rubs or gallop GI: soft, nondistended, bowel sounds present,. Umbilical tenderness Musculoskeletal: warm, edema resolved CNS: Alert and oriented, non focal    Data Review:    CBC  Recent Labs Lab 06/21/16 1124 06/24/16 1556 06/24/16 1606 06/26/16 0507 06/27/16 0756  WBC 5.0 12.0*  --  20.4* 25.3*  HGB 9.1* 10.8* 12.2* 9.1* 9.7*  HCT 29.0* 32.4* 36.0* 27.5* 29.5*  PLT 184 221  --  196 214  MCV 92.7 89.8  --  91.1 89.7  MCH 29.1 29.9  --  30.1 29.5  MCHC 31.4* 33.3  --  33.1 32.9  RDW 18.8* 18.7*  --  19.5* 19.2*  LYMPHSABS 1.0 1.3  --   --   --   MONOABS 0.4 1.1*  --   --   --   EOSABS 0.0 0.0  --   --   --   BASOSABS 0.0 0.0  --   --   --     Chemistries   Recent Labs Lab 06/24/16 1556 06/24/16 1606 06/25/16 0340 06/26/16 0507 06/27/16 0543  NA 135 138 133* 136 134*  K 3.8 3.8 3.3* 4.0 3.3*  CL 103 105 104 106 104  CO2 23  --  20* 20* 19*  GLUCOSE 131* 130* 175* 125* 115*  BUN 53* 51* 54* 56* 58*  CREATININE 2.79* 2.70* 2.64* 3.34* 3.13*  CALCIUM 8.8*  --  8.2* 8.4* 8.3*  MG 1.9  --   --   --   --   AST 19  --   --   --   --   ALT 9*  --   --   --   --   ALKPHOS 52  --   --   --   --   BILITOT 0.5  --   --   --   --    ------------------------------------------------------------------------------------------------------------------ No results for input(s): CHOL, HDL, LDLCALC, TRIG, CHOLHDL, LDLDIRECT in the last  72 hours.  Lab Results  Component Value Date   HGBA1C 4.9 02/20/2016   ------------------------------------------------------------------------------------------------------------------  Recent Labs  06/25/16 0340  TSH 22.371*   ------------------------------------------------------------------------------------------------------------------ No results for input(s): VITAMINB12, FOLATE, FERRITIN, TIBC, IRON, RETICCTPCT in the last 72 hours.  Coagulation profile  Recent Labs Lab 06/22/16 06/24/16 1556 06/25/16 0340 06/26/16 0507 06/27/16 0543  INR 3.7 2.45 2.67 3.71 3.92    No results for input(s): DDIMER in the last 72 hours.  Cardiac Enzymes No results for input(s): CKMB, TROPONINI, MYOGLOBIN in the last 168 hours.  Invalid input(s): CK ------------------------------------------------------------------------------------------------------------------    Component Value Date/Time  BNP 1,430.8 (H) 06/25/2016 0340   BNP 891.5 (H) 06/18/2016 1627    Inpatient Medications  Scheduled Meds: . amiodarone  100 mg Oral Daily  . atorvastatin  80 mg Oral QHS  . carvedilol  25 mg Oral BID WC  . hydrALAZINE  75 mg Oral Q8H  . isosorbide mononitrate  120 mg Oral Daily  . levothyroxine  50 mcg Oral QAC breakfast  . piperacillin-tazobactam (ZOSYN)  IV  3.375 g Intravenous Q8H  . potassium chloride SA  20 mEq Oral BID  . sodium chloride flush  3 mL Intravenous Q12H  . tamsulosin  0.4 mg Oral QPC supper  . vancomycin  500 mg Oral Q6H  . Warfarin - Pharmacist Dosing Inpatient   Does not apply q1800   Continuous Infusions: PRN Meds:.sodium chloride, acetaminophen, HYDROcodone-acetaminophen, HYDROmorphone (DILAUDID) injection, ondansetron (ZOFRAN) IV, sodium chloride flush  Micro Results Recent Results (from the past 240 hour(s))  MRSA PCR Screening     Status: None   Collection Time: 06/24/16 10:41 PM  Result Value Ref Range Status   MRSA by PCR NEGATIVE NEGATIVE Final     Comment:        The GeneXpert MRSA Assay (FDA approved for NASAL specimens only), is one component of a comprehensive MRSA colonization surveillance program. It is not intended to diagnose MRSA infection nor to guide or monitor treatment for MRSA infections.   Culture, Urine     Status: Abnormal   Collection Time: 06/25/16  2:53 AM  Result Value Ref Range Status   Specimen Description URINE, RANDOM  Final   Special Requests NONE  Final   Culture >=100,000 COLONIES/mL PSEUDOMONAS AERUGINOSA (A)  Final   Report Status 06/27/2016 FINAL  Final   Organism ID, Bacteria PSEUDOMONAS AERUGINOSA (A)  Final      Susceptibility   Pseudomonas aeruginosa - MIC*    CEFTAZIDIME 4 SENSITIVE Sensitive     CIPROFLOXACIN <=0.25 SENSITIVE Sensitive     GENTAMICIN <=1 SENSITIVE Sensitive     IMIPENEM 2 SENSITIVE Sensitive     PIP/TAZO <=4 SENSITIVE Sensitive     CEFEPIME 4 SENSITIVE Sensitive     * >=100,000 COLONIES/mL PSEUDOMONAS AERUGINOSA    Radiology Reports Ct Abdomen Pelvis Wo Contrast  Result Date: 06/26/2016 CLINICAL DATA:  Right back pain for the past 7 days. Urinary tract infection. Moderate right hydronephrosis on a renal ultrasound earlier today. EXAM: CT ABDOMEN AND PELVIS WITHOUT CONTRAST TECHNIQUE: Multidetector CT imaging of the abdomen and pelvis was performed following the standard protocol without IV contrast. COMPARISON:  Renal ultrasound earlier today. Abdomen and pelvis CT dated 04/08/2016. FINDINGS: Lower chest: Small bilateral pleural effusions. Mild bilateral lower lobe atelectasis. Hepatobiliary: Liver cysts.  Cholecystectomy clips. Pancreas: Unremarkable. No pancreatic ductal dilatation or surrounding inflammatory changes. Spleen: Normal in size without focal abnormality. Adrenals/Urinary Tract: Moderate dilatation of the right renal collecting system. Tiny upper pole right renal calculus or vascular calcification. No bladder or ureteral calculi a seen. Unremarkable left  kidney. Grossly normal adrenal glands. Multiple bladder diverticula. Stomach/Bowel: Moderate diffuse low density wall thickening involving the colon, including the rectum. Grossly unremarkable stomach and small bowel. Vascular/Lymphatic: Extensive, dense, diffuse arterial calcifications, including the coronary arteries and the abdominal aorta and its branches. No enlarged lymph nodes. Reproductive: Normal sized prostate containing coarse calcifications. Other: The examination is limited by streak artifacts produced by an electronic device on the left side of the patient and limited by a diffuse edema of the subcutaneous, intraperitoneal and retroperitoneal fat.  Musculoskeletal: Mild lower lumbar spine degenerative changes. IMPRESSION: 1. Interval diffuse colitis and proctitis. 2. Interval moderate right hydronephrosis with no visible cause. 3. Severe diffuse atheromatous arterial calcifications, including coronary artery atherosclerosis and aortic atherosclerosis. 4. Progressive diffuse anasarca. 5. Small bilateral pleural effusions. 6. Mild bilateral lower lobe atelectasis. 7. Multiple bladder diverticula. Electronically Signed   By: Claudie Revering M.D.   On: 06/26/2016 18:48   Dg Chest 2 View  Result Date: 06/24/2016 CLINICAL DATA:  Weakness, recent endocarditis EXAM: CHEST  2 VIEW COMPARISON:  05/25/2016 FINDINGS: Cardiomediastinal silhouette is stable. Status post CABG. Atherosclerotic calcifications of thoracic aorta. Bilateral small pleural effusion. Streaky bilateral basilar atelectasis or infiltrate right greater than left. No pulmonary edema. IMPRESSION: Status post CABG. Atherosclerotic calcifications of thoracic aorta. Bilateral small pleural effusion. Streaky bilateral basilar atelectasis or infiltrate right greater than left. No pulmonary edema. Electronically Signed   By: Lahoma Crocker M.D.   On: 06/24/2016 15:05   Dg Ribs Unilateral Right  Result Date: 06/24/2016 CLINICAL DATA:  80 year old male  with fall and a right posterior rib pain. EXAM: RIGHT RIBS - 2 VIEW COMPARISON:  Chest radiograph dated 06/24/2016 and chest radiograph dated 04/08/2016 FINDINGS: There is an age indeterminate nondisplaced fracture of the posterolateral aspect of the right ninth rib. The bones are osteopenic which limits evaluation for fracture. No other fracture identified. There is a small right pleural effusion. Right lung base opacity may represent atelectatic changes versus infiltrate. Trace fluid noted in the minor fissure. There is cardiomegaly. Median sternotomy wires and CABG surgical clips noted. There is atherosclerotic calcification of the aorta. There is degenerative changes of the spine. Right upper quadrant cholecystectomy clips noted. IMPRESSION: Nondisplaced fracture of the posterior aspect of the right ninth rib, age indeterminate. No pneumothorax. Small right pleural effusion with right lung base atelectasis versus infiltrate. Electronically Signed   By: Anner Crete M.D.   On: 06/24/2016 23:28   Mr Brain Wo Contrast  Result Date: 06/24/2016 CLINICAL DATA:  LEFT lower extremity weakness. History of prostate cancer, diabetes. EXAM: MRI HEAD WITHOUT CONTRAST TECHNIQUE: Multiplanar, multiecho pulse sequences of the brain and surrounding structures were obtained without intravenous contrast. COMPARISON:  CT HEAD February 20, 2016 FINDINGS: BRAIN: No reduced diffusion to suggest acute ischemia or hypercellular tumor. Punctate chronic microhemorrhage LEFT parietal lobe. The ventricles and sulci are normal for patient's age. Patchy to confluent supratentorial white matter FLAIR T2 hyperintensities. No suspicious parenchymal signal, masses or mass effect. Multiple small cortical infarcts. Prominent symmetric perivascular spaces, normal variant. No abnormal extra-axial fluid collections. No extra-axial masses though, contrast enhanced sequences would be more sensitive. VASCULAR: Normal major intracranial vascular  flow voids present at skull base. SKULL AND UPPER CERVICAL SPINE: No abnormal sellar expansion. No suspicious calvarial bone marrow signal. Craniocervical junction maintained. Trace atlantodental fusion. SINUSES/ORBITS: Lobulated LEFT maxillary sinus mucosal thickening on, trace remaining paranasal sinus mucosal thickening. Trace mastoid effusions. The included ocular globes and orbital contents are non-suspicious. OTHER: None. IMPRESSION: No acute intracranial process. Involutional changes. Moderate chronic small vessel ischemic disease. Electronically Signed   By: Elon Alas M.D.   On: 06/24/2016 21:16   Mr Lumbar Spine Wo Contrast  Result Date: 06/25/2016 CLINICAL DATA:  80 year old male with generalized weakness for 3 days. Lumbar back pain after a fall. Leg weakness. Initial encounter. EXAM: MRI LUMBAR SPINE WITHOUT CONTRAST TECHNIQUE: Multiplanar, multisequence MR imaging of the lumbar spine was performed. No intravenous contrast was administered. COMPARISON:  CT Abdomen and Pelvis 04/08/2016 FINDINGS:  MRI imaging quality of the lumbar spine is significantly degraded by generalize loss of signal despite repeated imaging attempts. The etiology of the signal loss seems to be generalized soft tissue edema such as anasarca. Segmentation: Normal as seen on the comparison CT Abdomen and Pelvis. Alignment: Stable since the prior CT. Mild grade 1 anterolisthesis of L5 on S1. Mild dextroconvex lumbar scoliosis. Vertebrae: Suboptimal characterization of marrow due to the decreased signal to noise. No definite marrow edema or acute osseous abnormality. Conus medullaris: Extends to the L1 level and appears normal. Paraspinal and other soft tissues: Diffuse soft tissue edema suspected such is anasarca. This also seems to affect the visible paraspinal muscles bilaterally. Very limited visualization of abdominal viscera. The visualized kidneys appear stable to the prior CT. Disc levels: No significant lumbar  spinal stenosis. Mild lumbar spine degeneration above L4. At L4-L5 there is disc bulging and moderate to severe facet and ligament flavum hypertrophy. Both facet joints are capacious and contain fluid. Small posteriorly situated synovial cyst on the left. No significant stenosis. Chronic mild anterolisthesis at L5-S1 with circumferential disc bulge and moderate to severe facet hypertrophy. No spinal stenosis. Up to mild lateral recess stenosis and bilateral L5 foraminal stenosis. IMPRESSION: 1. MRI of the lumbar spine significantly degraded by signal loss despite repeated imaging attempts, which seems to be due to generalized body soft tissue edema such as due to anasarca. 2. Given #1, diffuse lumbar Myositis is difficult to exclude. No acute osseous abnormality identified. 3. No significant lumbar spinal stenosis. Spinal degeneration primarily only at L4-L5 and L5-S1 where there is facet arthropathy in part related to mild spondylolisthesis. Subsequent up to mild multifactorial lateral recess and foraminal stenosis. Electronically Signed   By: Genevie Ann M.D.   On: 06/25/2016 10:29   US Renal  Result Date: 06/26/2016 CLINICAL DATA:  Acute kidney failure EXAM: RENAL / URINARY TRACT ULTRASOUND COMPLETE COMPARISON:  None. FINDINGS: Right Kidney: Length: 10.6 cm. 1.6 cm cyst in the upper pole. Moderate hydronephrosis. Diffusely increased echotexture throughout the right kidney. Left Kidney: Length: 10.1 cm. Increased echotexture throughout the left kidney. No hydronephrosis or mass. Bladder: Layering debris dependently in the bladder. Mild bladder wall irregularity and thickening. IMPRESSION: Moderate right hydronephrosis. Increased echotexture within the kidneys bilaterally compatible with chronic medical renal disease. Irregular wall thickening throughout the bladder. While this could be related to bladder outlet obstruction, I cannot exclude infection or neoplasm. Recommend clinical correlation. Electronically  Signed   By: Rolm Baptise M.D.   On: 06/26/2016 10:50    Time Spent in minutes  35   Louellen Molder M.D on 06/27/2016 at 1:22 PM  Between 7am to 7pm - Pager - [phone]  After 7pm go to [email or web address] - password [email or web address]  [facility] -  Office  [phone]

## 2016-06-27 NOTE — Progress Notes (Signed)
Pharmacy Antibiotic Follow-up Note  [name]. is a 80 y.o. year-old male admitted on 06/24/2016.  Patient to ED 12/14 with weakness, fall at home 3 days PTA. Hx of MSSA bacteremia, pacemaker removed, tx with Ancef 7/7-25; + Rifampin to 8/3. Re-admit with CDiff 9/28 tx Po Vanc/IV Flagyl. Found tricuspid vegetation with neg BCx: tx with 14 days po Vanc, 6 wk Ancef to 11/14. Re-admit 11/14 with worsening Cr, PICC removed - found RUE DVT > Warfarin. UCx Pseudomonas tx with Fosfomycin.  Today, 06/27/16 To switch cefepime to Zosyn per pharmacy for IAI  Assessment/Plan: Zosyn 3.375g IV q8 (extended interval infusion) for CrCl of 20 ml/min Monitor renal function closely with CKD.  Temp (24hrs), Avg:98.7 F (37.1 C), Min:98.2 F (36.8 C), Max:99.2 F (37.3 C)   Recent Labs Lab 06/21/16 1124 06/24/16 1556 06/26/16 0507 06/27/16 0756  WBC 5.0 12.0* 20.4* 25.3*     Recent Labs Lab 06/24/16 1556 06/24/16 1606 06/25/16 0340 06/26/16 0507 06/27/16 0543  CREATININE 2.79* 2.70* 2.64* 3.34* 3.13*   Estimated Creatinine Clearance: 20.1 mL/min (by C-G formula based on SCr of 3.13 mg/dL (H)).    Allergies  Allergen Reactions  . Nsaids Nausea Only    Reaction:  GI upset   . Ibuprofen Other (See Comments)    Reaction:  GI upset   . Ace Inhibitors Cough   Antimicrobials this admission: 12/14 Cefepime >> 12/17 12/17 Zosyn >>  Levels/dose changes this admission:  Microbiology results: No Cx ordered at this time   Thank you for allowing pharmacy to be a part of this patient's care.   Adrian Saran, PharmD, BCPS Pager [phone] 06/27/2016 9:42 AM

## 2016-06-28 DIAGNOSIS — D638 Anemia in other chronic diseases classified elsewhere: Secondary | ICD-10-CM

## 2016-06-28 DIAGNOSIS — A0472 Enterocolitis due to Clostridium difficile, not specified as recurrent: Secondary | ICD-10-CM

## 2016-06-28 LAB — BASIC METABOLIC PANEL
ANION GAP: 9 (ref 5–15)
BUN: 57 mg/dL — AB (ref 4–21)
BUN: 57 mg/dL — ABNORMAL HIGH (ref 6–20)
CALCIUM: 8.1 mg/dL — AB (ref 8.9–10.3)
CO2: 21 mmol/L — AB (ref 22–32)
CREATININE: 2.9 mg/dL — AB (ref 0.6–1.3)
Chloride: 105 mmol/L (ref 101–111)
Creatinine, Ser: 2.93 mg/dL — ABNORMAL HIGH (ref 0.61–1.24)
GFR calc Af Amer: 22 mL/min — ABNORMAL LOW (ref >60)
GFR calc non Af Amer: 19 mL/min — ABNORMAL LOW (ref >60)
GLUCOSE: 145 mg/dL — AB (ref 65–99)
Glucose: 145 mg/dL
Potassium: 3.5 mmol/L (ref 3.4–5.3)
Potassium: 3.5 mmol/L (ref 3.5–5.1)
Sodium: 135 mmol/L — AB (ref 137–147)
Sodium: 135 mmol/L (ref 135–145)

## 2016-06-28 LAB — CBC AND DIFFERENTIAL
HEMATOCRIT: 25 % — AB (ref 41–53)
Hemoglobin: 8.3 g/dL — AB (ref 13.5–17.5)
Platelets: 200 10*3/uL (ref 150–399)
WBC: 20.2 10*3/mL

## 2016-06-28 LAB — CBC
HEMATOCRIT: 24.7 % — AB (ref 39.0–52.0)
Hemoglobin: 8.3 g/dL — ABNORMAL LOW (ref 13.0–17.0)
MCH: 29.7 pg (ref 26.0–34.0)
MCHC: 33.6 g/dL (ref 30.0–36.0)
MCV: 88.5 fL (ref 78.0–100.0)
Platelets: 200 10*3/uL (ref 150–400)
RBC: 2.79 MIL/uL — AB (ref 4.22–5.81)
RDW: 19.2 % — AB (ref 11.5–15.5)
WBC: 20.2 10*3/uL — AB (ref 4.0–10.5)

## 2016-06-28 LAB — PROTIME-INR
INR: 3.93
Prothrombin Time: 39.4 seconds — ABNORMAL HIGH (ref 11.4–15.2)

## 2016-06-28 NOTE — Progress Notes (Signed)
[place] for Warfarin Indication: Atrial fibrillation, hx of DVT  Allergies  Allergen Reactions  . Nsaids Nausea Only    Reaction:  GI upset   . Ibuprofen Other (See Comments)    Reaction:  GI upset   . Ace Inhibitors Cough   Patient Measurements: Height: 5\' 10"  (177.8 cm) Weight: 193 lb (87.5 kg) IBW/kg (Calculated) : 73  Vital Signs: Temp: 98.7 F (37.1 C) (12/18 0627) Temp Source: Oral (12/18 0627) BP: 144/60 (12/18 0900) Pulse Rate: 71 (12/18 0900)  Labs:  Recent Labs  06/26/16 0507 06/27/16 0543 06/27/16 0756 06/28/16 0401  HGB 9.1*  --  9.7* 8.3*  HCT 27.5*  --  29.5* 24.7*  PLT 196  --  214 200  LABPROT 37.7* 39.4*  --  39.4*  INR 3.71 3.92  --  3.93  CREATININE 3.34* 3.13*  --  2.93*   Estimated Creatinine Clearance: 21.5 mL/min (by C-G formula based on SCr of 2.93 mg/dL (H)).  Medications:  Scheduled:  . amiodarone  100 mg Oral Daily  . atorvastatin  80 mg Oral QHS  . carvedilol  25 mg Oral BID WC  . hydrALAZINE  75 mg Oral Q8H  . isosorbide mononitrate  120 mg Oral Daily  . levothyroxine  50 mcg Oral QAC breakfast  . piperacillin-tazobactam (ZOSYN)  IV  3.375 g Intravenous Q8H  . potassium chloride SA  20 mEq Oral BID  . sodium chloride flush  3 mL Intravenous Q12H  . tamsulosin  0.4 mg Oral QPC supper  . vancomycin  500 mg Oral Q6H  . Warfarin - Pharmacist Dosing Inpatient   Does not apply q1800   Assessment: 89 yoM to ED 12/14 with weakness, fall at home 3 days PTA. Admit INR 2.45 - Patient stated home regimen is 12mg  daily - Coag clinic visit 12/12 with INR 3.7, hold Warf 12/13 then resume 6mg  daily    Today, 06/28/2016  INR still supratherapeutic after doses of 6mg  and 4mg  as inpatient and no doses x past 2 days  CBC: Hgb low and dropped. Plts stable  Drug interactions: no new significant interactions (amiodarone is home med and regimen already adjusted for this)  Note patient stated home dose  12mg  daily vs coumadin clinic notes to be taking 6mg  daily (as on 12/12)  Goal of Therapy:  INR 2-3 Monitor platelets by anticoagulation protocol: Yes   Plan:   Continue to hold warfarin today  Daily INR   Dolly Rias RPh 06/28/2016, 10:28 AM Pager [phone]

## 2016-06-28 NOTE — Progress Notes (Addendum)
PROGRESS NOTE                                                                                                                                                                                                             Patient Demographics:    [name], is a 80 y.o. male, DOB - Jun 17, 1936, [id number]  Admit date - 06/24/2016   Admitting Physician Lily Kocher, MD  Outpatient Primary MD for the patient is [name], MD  LOS - 4  Outpatient Specialists: cardiology  Chief Complaint  Patient presents with  . Weakness       Brief Narrative   80 year old male with multiple comorbidities including CAD with history of CABG, PCI, PAF/sick sinus syndrome status post PPM (was removed recently due to systemic infection), taken off anticoagulation due to bleeding complications, chronic diastolic CHF, hypertension, diabetes mellitus, CAD stage IV, myelodysplastic syndrome and hypothyroidism was brought to the ED by his wife and daughter with generalized weakness and fall at home 3 days prior to admission. 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 difficile colitis and pseudomonal UTI. He was discharged on Coumadin for DVT. Patient fell at home 3 days prior to admission stating that his legs gave away. No loss of consciousness but landed on his eyes and had pain in his right shoulder and mid to lower back. Denies any bowel or urinary symptoms. Denies chest pain, palpitations, but has increased dyspnea on exertion. Reports good appetite. Family have been looking for SNF options.  In the ED He was hypertensive with systolic blood pressure in 200s. Labs showed WBC of 12, mildly elevated BUN and creatinine (53/2.79), UA positive for UTI. MRI of brain was negative for acute intracranial activity. MRI of the lumbar spine pending. admitted to  stepdown for further management.      Subjective:   Still having some diarrhea. Complains of abdominal pain. Stool for C. difficile was positive.   Assessment  & Plan :    Principal Problem:   Hypertensive crisis Better with pain control. Resumed home blood pressure medications (Coreg, clonidine, hydralazine, Imdur).Increase hydralazine dose.  Active Problems: Acute C. difficile colitis  CT scan done for right hydronephrosis showing diffuse colitis and? Proctitis. Has some abdominal tenderness. Stool for C. difficile positive. Patient  was on prolonged antibiotics until recently.  Added oral vancomycin. Treat for at least 2 weeks.    Acute on CKD (chronic kidney disease), stage IV (HCC) Worsened renal fn with moderate right-sided hydronephrosis seen on Imaging. Hx of hemorrhagic cystitis with bladder clot, previously requiring CBI and foley for several months.  -urology consult appreciated. Foley placed in with good urine output. Recommend to leave Foley catheter for now and monitor. Reevaluate with renal ultrasound in 2-3 days. -Renal function improving. -unclear how foley got discontinued today. Bladder scan showing 500 mL urine. Placed Foley back in.     Fall with leg weakness and low back pain. MRI negative for stroke. MRI of the lumbar spine without acute injury. ? Myositis.  PT evaluation. Family wanting patient to be go to SNF. Patient agrees.    Recurrent Pseudomonas UTI Cefepime switched to Zosyn to cover intra-abdominal infection.  Acute on chronic diastolic CHF Improved with IV Lasix. Held further given improvement and worsened renal function. Continue beta blocker and Imdur.     PAF (paroxysmal atrial fibrillation) (HCC) Rate controlled. Continue beta blocker and amiodarone. On warfarin for upper extremity DVT. Stool for Hemoccult positive. Mild drop in H&H. Monitor closely.    Anemia of chronic disease Stable. For Hemoccult positive. Monitor while on  anticoagulant is seen.  Low back pain and lower extremity weakness Pain control with when necessary Vicodin and low-dose Dilaudid. MRI of the back negative for acute injury. PT recommends SNF.   BPH Continue Flomax.  Recent upper extremity DVT On warfarin. Dosing per pharmacy. Monitor H&H closely.   CAD with history of CABG (94 and 2013) Follows with Dr. Tamala Julian. Continue beta blocker.  History of PAF/sick sinus syndrome Pacemaker removed recently due to infection. Not on chronic anticoagulation due to bleeding complete dictations.  Hypothyroidism Continue Synthroid.  Dependent sacral pressure ulcer Wound care evaluation.  Code Status : Full code  Family Communication  : Discussed with daughter at bedside on 12/16.  Disposition Plan  : needs SNF. Possibly in the next 72 hours if improving.  Barriers For Discharge : Active symptoms  Consults  :   urology  Procedures  :  MRI brain MRI lumbar spine  DVT Prophylaxis  :  Coumadin  Lab Results  Component Value Date   PLT 200 06/28/2016    Antibiotics  :   Anti-infectives    Start     Dose/Rate Route Frequency Ordered Stop   06/27/16 1200  vancomycin (VANCOCIN) 50 mg/mL oral solution 500 mg     500 mg Oral Every 6 hours 06/27/16 0938 07/11/16 1159   06/27/16 1000  piperacillin-tazobactam (ZOSYN) IVPB 3.375 g     3.375 g 12.5 mL/hr over 240 Minutes Intravenous Every 8 hours 06/27/16 0943     06/27/16 0800  metroNIDAZOLE (FLAGYL) tablet 500 mg  Status:  Discontinued     500 mg Oral Every 8 hours 06/27/16 0749 06/27/16 0938   06/25/16 2200  ceFEPIme (MAXIPIME) 1 g in dextrose 5 % 50 mL IVPB  Status:  Discontinued     1 g 100 mL/hr over 30 Minutes Intravenous Every 24 hours 06/24/16 2229 06/27/16 0938   06/24/16 2145  ceFEPIme (MAXIPIME) 2 g in dextrose 5 % 50 mL IVPB     2 g 100 mL/hr over 30 Minutes Intravenous  Once 06/24/16 2135 06/25/16 0022        Objective:   Vitals:   06/27/16 2214 06/28/16 0627  06/28/16 0740 06/28/16 0900  BP: Marland Kitchen)  182/62 (!) 162/53  (!) 144/60  Pulse: 76 72  71  Resp: 18 16    Temp: 98.9 F (37.2 C) 98.7 F (37.1 C)    TempSrc: Oral Oral    SpO2: 97% 96%    Weight:   87.5 kg (193 lb)   Height:        Wt Readings from Last 3 Encounters:  06/28/16 87.5 kg (193 lb)  06/07/16 78.4 kg (172 lb 12.8 oz)  06/03/16 83.1 kg (183 lb 3.2 oz)     Intake/Output Summary (Last 24 hours) at 06/28/16 1342 Last data filed at 06/28/16 0851  Gross per 24 hour  Intake              870 ml  Output             1250 ml  Net             -380 ml     Physical Exam  Gen: Ears extremely fatigued HEENT:  moist mucosa, supple neck Chest: clear b/l, no added sounds CVS: N S1&S2, no murmurs, rubs or gallop GI: soft, nondistended, midabdominal tenderness, bowel sounds present Musculoskeletal: warm, edema resolved CNS: Alert and oriented, non focal    Data Review:    CBC  Recent Labs Lab 06/24/16 1556 06/24/16 1606 06/26/16 0507 06/27/16 0756 06/28/16 0401  WBC 12.0*  --  20.4* 25.3* 20.2*  HGB 10.8* 12.2* 9.1* 9.7* 8.3*  HCT 32.4* 36.0* 27.5* 29.5* 24.7*  PLT 221  --  196 214 200  MCV 89.8  --  91.1 89.7 88.5  MCH 29.9  --  30.1 29.5 29.7  MCHC 33.3  --  33.1 32.9 33.6  RDW 18.7*  --  19.5* 19.2* 19.2*  LYMPHSABS 1.3  --   --   --   --   MONOABS 1.1*  --   --   --   --   EOSABS 0.0  --   --   --   --   BASOSABS 0.0  --   --   --   --     Chemistries   Recent Labs Lab 06/24/16 1556 06/24/16 1606 06/25/16 0340 06/26/16 0507 06/27/16 0543 06/28/16 0401  NA 135 138 133* 136 134* 135  K 3.8 3.8 3.3* 4.0 3.3* 3.5  CL 103 105 104 106 104 105  CO2 23  --  20* 20* 19* 21*  GLUCOSE 131* 130* 175* 125* 115* 145*  BUN 53* 51* 54* 56* 58* 57*  CREATININE 2.79* 2.70* 2.64* 3.34* 3.13* 2.93*  CALCIUM 8.8*  --  8.2* 8.4* 8.3* 8.1*  MG 1.9  --   --   --   --   --   AST 19  --   --   --   --   --   ALT 9*  --   --   --   --   --   ALKPHOS 52  --   --   --    --   --   BILITOT 0.5  --   --   --   --   --    ------------------------------------------------------------------------------------------------------------------ No results for input(s): CHOL, HDL, LDLCALC, TRIG, CHOLHDL, LDLDIRECT in the last 72 hours.  Lab Results  Component Value Date   HGBA1C 4.9 02/20/2016   ------------------------------------------------------------------------------------------------------------------ No results for input(s): TSH, T4TOTAL, T3FREE, THYROIDAB in the last 72 hours.  Invalid input(s): FREET3 ------------------------------------------------------------------------------------------------------------------ No results for input(s): VITAMINB12, FOLATE, FERRITIN, TIBC, IRON,  RETICCTPCT in the last 72 hours.  Coagulation profile  Recent Labs Lab 06/24/16 1556 06/25/16 0340 06/26/16 0507 06/27/16 0543 06/28/16 0401  INR 2.45 2.67 3.71 3.92 3.93    No results for input(s): DDIMER in the last 72 hours.  Cardiac Enzymes No results for input(s): CKMB, TROPONINI, MYOGLOBIN in the last 168 hours.  Invalid input(s): CK ------------------------------------------------------------------------------------------------------------------    Component Value Date/Time   BNP 1,430.8 (H) 06/25/2016 0340   BNP 891.5 (H) 06/18/2016 1627    Inpatient Medications  Scheduled Meds: . amiodarone  100 mg Oral Daily  . atorvastatin  80 mg Oral QHS  . carvedilol  25 mg Oral BID WC  . hydrALAZINE  75 mg Oral Q8H  . isosorbide mononitrate  120 mg Oral Daily  . levothyroxine  50 mcg Oral QAC breakfast  . piperacillin-tazobactam (ZOSYN)  IV  3.375 g Intravenous Q8H  . potassium chloride SA  20 mEq Oral BID  . sodium chloride flush  3 mL Intravenous Q12H  . tamsulosin  0.4 mg Oral QPC supper  . vancomycin  500 mg Oral Q6H  . Warfarin - Pharmacist Dosing Inpatient   Does not apply q1800   Continuous Infusions: PRN Meds:.sodium chloride, acetaminophen,  HYDROcodone-acetaminophen, HYDROmorphone (DILAUDID) injection, ondansetron (ZOFRAN) IV, sodium chloride flush  Micro Results Recent Results (from the past 240 hour(s))  MRSA PCR Screening     Status: None   Collection Time: 06/24/16 10:41 PM  Result Value Ref Range Status   MRSA by PCR NEGATIVE NEGATIVE Final    Comment:        The GeneXpert MRSA Assay (FDA approved for NASAL specimens only), is one component of a comprehensive MRSA colonization surveillance program. It is not intended to diagnose MRSA infection nor to guide or monitor treatment for MRSA infections.   Culture, Urine     Status: Abnormal   Collection Time: 06/25/16  2:53 AM  Result Value Ref Range Status   Specimen Description URINE, RANDOM  Final   Special Requests NONE  Final   Culture >=100,000 COLONIES/mL PSEUDOMONAS AERUGINOSA (A)  Final   Report Status 06/27/2016 FINAL  Final   Organism ID, Bacteria PSEUDOMONAS AERUGINOSA (A)  Final      Susceptibility   Pseudomonas aeruginosa - MIC*    CEFTAZIDIME 4 SENSITIVE Sensitive     CIPROFLOXACIN <=0.25 SENSITIVE Sensitive     GENTAMICIN <=1 SENSITIVE Sensitive     IMIPENEM 2 SENSITIVE Sensitive     PIP/TAZO <=4 SENSITIVE Sensitive     CEFEPIME 4 SENSITIVE Sensitive     * >=100,000 COLONIES/mL PSEUDOMONAS AERUGINOSA  C difficile quick scan w PCR reflex     Status: Abnormal   Collection Time: 06/27/16 12:56 PM  Result Value Ref Range Status   C Diff antigen POSITIVE (A) NEGATIVE Final   C Diff toxin POSITIVE (A) NEGATIVE Final   C Diff interpretation Toxin producing C. difficile detected.  Final    Comment: CRITICAL RESULT CALLED TO, READ BACK BY AND VERIFIED WITH: S.GAGLIANO RN AT [id number] ON 06/27/16 BY S.VANHOORNE   Gastrointestinal Panel by PCR , Stool     Status: None   Collection Time: 06/27/16 12:56 PM  Result Value Ref Range Status   Campylobacter species NOT DETECTED NOT DETECTED Final   Plesimonas shigelloides NOT DETECTED NOT DETECTED Final    Salmonella species NOT DETECTED NOT DETECTED Final   Yersinia enterocolitica NOT DETECTED NOT DETECTED Final   Vibrio species NOT DETECTED NOT DETECTED Final   Vibrio  cholerae NOT DETECTED NOT DETECTED Final   Enteroaggregative E coli (EAEC) NOT DETECTED NOT DETECTED Final   Enteropathogenic E coli (EPEC) NOT DETECTED NOT DETECTED Final   Enterotoxigenic E coli (ETEC) NOT DETECTED NOT DETECTED Final   Shiga like toxin producing E coli (STEC) NOT DETECTED NOT DETECTED Final   Shigella/Enteroinvasive E coli (EIEC) NOT DETECTED NOT DETECTED Final   Cryptosporidium NOT DETECTED NOT DETECTED Final   Cyclospora cayetanensis NOT DETECTED NOT DETECTED Final   Entamoeba histolytica NOT DETECTED NOT DETECTED Final   Giardia lamblia NOT DETECTED NOT DETECTED Final   Adenovirus F40/41 NOT DETECTED NOT DETECTED Final   Astrovirus NOT DETECTED NOT DETECTED Final   Norovirus GI/GII NOT DETECTED NOT DETECTED Final   Rotavirus A NOT DETECTED NOT DETECTED Final   Sapovirus (I, II, IV, and V) NOT DETECTED NOT DETECTED Final    Radiology Reports Ct Abdomen Pelvis Wo Contrast  Result Date: 06/26/2016 CLINICAL DATA:  Right back pain for the past 7 days. Urinary tract infection. Moderate right hydronephrosis on a renal ultrasound earlier today. EXAM: CT ABDOMEN AND PELVIS WITHOUT CONTRAST TECHNIQUE: Multidetector CT imaging of the abdomen and pelvis was performed following the standard protocol without IV contrast. COMPARISON:  Renal ultrasound earlier today. Abdomen and pelvis CT dated 04/08/2016. FINDINGS: Lower chest: Small bilateral pleural effusions. Mild bilateral lower lobe atelectasis. Hepatobiliary: Liver cysts.  Cholecystectomy clips. Pancreas: Unremarkable. No pancreatic ductal dilatation or surrounding inflammatory changes. Spleen: Normal in size without focal abnormality. Adrenals/Urinary Tract: Moderate dilatation of the right renal collecting system. Tiny upper pole right renal calculus or vascular  calcification. No bladder or ureteral calculi a seen. Unremarkable left kidney. Grossly normal adrenal glands. Multiple bladder diverticula. Stomach/Bowel: Moderate diffuse low density wall thickening involving the colon, including the rectum. Grossly unremarkable stomach and small bowel. Vascular/Lymphatic: Extensive, dense, diffuse arterial calcifications, including the coronary arteries and the abdominal aorta and its branches. No enlarged lymph nodes. Reproductive: Normal sized prostate containing coarse calcifications. Other: The examination is limited by streak artifacts produced by an electronic device on the left side of the patient and limited by a diffuse edema of the subcutaneous, intraperitoneal and retroperitoneal fat. Musculoskeletal: Mild lower lumbar spine degenerative changes. IMPRESSION: 1. Interval diffuse colitis and proctitis. 2. Interval moderate right hydronephrosis with no visible cause. 3. Severe diffuse atheromatous arterial calcifications, including coronary artery atherosclerosis and aortic atherosclerosis. 4. Progressive diffuse anasarca. 5. Small bilateral pleural effusions. 6. Mild bilateral lower lobe atelectasis. 7. Multiple bladder diverticula. Electronically Signed   By: Claudie Revering M.D.   On: 06/26/2016 18:48   Dg Chest 2 View  Result Date: 06/24/2016 CLINICAL DATA:  Weakness, recent endocarditis EXAM: CHEST  2 VIEW COMPARISON:  05/25/2016 FINDINGS: Cardiomediastinal silhouette is stable. Status post CABG. Atherosclerotic calcifications of thoracic aorta. Bilateral small pleural effusion. Streaky bilateral basilar atelectasis or infiltrate right greater than left. No pulmonary edema. IMPRESSION: Status post CABG. Atherosclerotic calcifications of thoracic aorta. Bilateral small pleural effusion. Streaky bilateral basilar atelectasis or infiltrate right greater than left. No pulmonary edema. Electronically Signed   By: Lahoma Crocker M.D.   On: 06/24/2016 15:05   Dg Ribs  Unilateral Right  Result Date: 06/24/2016 CLINICAL DATA:  80 year old male with fall and a right posterior rib pain. EXAM: RIGHT RIBS - 2 VIEW COMPARISON:  Chest radiograph dated 06/24/2016 and chest radiograph dated 04/08/2016 FINDINGS: There is an age indeterminate nondisplaced fracture of the posterolateral aspect of the right ninth rib. The bones are osteopenic which limits  evaluation for fracture. No other fracture identified. There is a small right pleural effusion. Right lung base opacity may represent atelectatic changes versus infiltrate. Trace fluid noted in the minor fissure. There is cardiomegaly. Median sternotomy wires and CABG surgical clips noted. There is atherosclerotic calcification of the aorta. There is degenerative changes of the spine. Right upper quadrant cholecystectomy clips noted. IMPRESSION: Nondisplaced fracture of the posterior aspect of the right ninth rib, age indeterminate. No pneumothorax. Small right pleural effusion with right lung base atelectasis versus infiltrate. Electronically Signed   By: Anner Crete M.D.   On: 06/24/2016 23:28   Mr Brain Wo Contrast  Result Date: 06/24/2016 CLINICAL DATA:  LEFT lower extremity weakness. History of prostate cancer, diabetes. EXAM: MRI HEAD WITHOUT CONTRAST TECHNIQUE: Multiplanar, multiecho pulse sequences of the brain and surrounding structures were obtained without intravenous contrast. COMPARISON:  CT HEAD February 20, 2016 FINDINGS: BRAIN: No reduced diffusion to suggest acute ischemia or hypercellular tumor. Punctate chronic microhemorrhage LEFT parietal lobe. The ventricles and sulci are normal for patient's age. Patchy to confluent supratentorial white matter FLAIR T2 hyperintensities. No suspicious parenchymal signal, masses or mass effect. Multiple small cortical infarcts. Prominent symmetric perivascular spaces, normal variant. No abnormal extra-axial fluid collections. No extra-axial masses though, contrast enhanced  sequences would be more sensitive. VASCULAR: Normal major intracranial vascular flow voids present at skull base. SKULL AND UPPER CERVICAL SPINE: No abnormal sellar expansion. No suspicious calvarial bone marrow signal. Craniocervical junction maintained. Trace atlantodental fusion. SINUSES/ORBITS: Lobulated LEFT maxillary sinus mucosal thickening on, trace remaining paranasal sinus mucosal thickening. Trace mastoid effusions. The included ocular globes and orbital contents are non-suspicious. OTHER: None. IMPRESSION: No acute intracranial process. Involutional changes. Moderate chronic small vessel ischemic disease. Electronically Signed   By: Elon Alas M.D.   On: 06/24/2016 21:16   Mr Lumbar Spine Wo Contrast  Result Date: 06/25/2016 CLINICAL DATA:  80 year old male with generalized weakness for 3 days. Lumbar back pain after a fall. Leg weakness. Initial encounter. EXAM: MRI LUMBAR SPINE WITHOUT CONTRAST TECHNIQUE: Multiplanar, multisequence MR imaging of the lumbar spine was performed. No intravenous contrast was administered. COMPARISON:  CT Abdomen and Pelvis 04/08/2016 FINDINGS: MRI imaging quality of the lumbar spine is significantly degraded by generalize loss of signal despite repeated imaging attempts. The etiology of the signal loss seems to be generalized soft tissue edema such as anasarca. Segmentation: Normal as seen on the comparison CT Abdomen and Pelvis. Alignment: Stable since the prior CT. Mild grade 1 anterolisthesis of L5 on S1. Mild dextroconvex lumbar scoliosis. Vertebrae: Suboptimal characterization of marrow due to the decreased signal to noise. No definite marrow edema or acute osseous abnormality. Conus medullaris: Extends to the L1 level and appears normal. Paraspinal and other soft tissues: Diffuse soft tissue edema suspected such is anasarca. This also seems to affect the visible paraspinal muscles bilaterally. Very limited visualization of abdominal viscera. The  visualized kidneys appear stable to the prior CT. Disc levels: No significant lumbar spinal stenosis. Mild lumbar spine degeneration above L4. At L4-L5 there is disc bulging and moderate to severe facet and ligament flavum hypertrophy. Both facet joints are capacious and contain fluid. Small posteriorly situated synovial cyst on the left. No significant stenosis. Chronic mild anterolisthesis at L5-S1 with circumferential disc bulge and moderate to severe facet hypertrophy. No spinal stenosis. Up to mild lateral recess stenosis and bilateral L5 foraminal stenosis. IMPRESSION: 1. MRI of the lumbar spine significantly degraded by signal loss despite repeated imaging attempts, which seems  to be due to generalized body soft tissue edema such as due to anasarca. 2. Given #1, diffuse lumbar Myositis is difficult to exclude. No acute osseous abnormality identified. 3. No significant lumbar spinal stenosis. Spinal degeneration primarily only at L4-L5 and L5-S1 where there is facet arthropathy in part related to mild spondylolisthesis. Subsequent up to mild multifactorial lateral recess and foraminal stenosis. Electronically Signed   By: Genevie Ann M.D.   On: 06/25/2016 10:29   US Renal  Result Date: 06/26/2016 CLINICAL DATA:  Acute kidney failure EXAM: RENAL / URINARY TRACT ULTRASOUND COMPLETE COMPARISON:  None. FINDINGS: Right Kidney: Length: 10.6 cm. 1.6 cm cyst in the upper pole. Moderate hydronephrosis. Diffusely increased echotexture throughout the right kidney. Left Kidney: Length: 10.1 cm. Increased echotexture throughout the left kidney. No hydronephrosis or mass. Bladder: Layering debris dependently in the bladder. Mild bladder wall irregularity and thickening. IMPRESSION: Moderate right hydronephrosis. Increased echotexture within the kidneys bilaterally compatible with chronic medical renal disease. Irregular wall thickening throughout the bladder. While this could be related to bladder outlet obstruction, I  cannot exclude infection or neoplasm. Recommend clinical correlation. Electronically Signed   By: Rolm Baptise M.D.   On: 06/26/2016 10:50    Time Spent in minutes  35   Louellen Molder M.D on 06/28/2016 at 1:42 PM  Between 7am to 7pm - Pager - [phone]  After 7pm go to [email or web address] - password [facility]  [facility] -  Office  [phone]

## 2016-06-28 NOTE — Progress Notes (Signed)
Physical Therapy Treatment Patient Details Name: [name] MCELMURRAY Sr. MRN: [id number] DOB: 03/04/1936 Today's Date: 06/28/2016    History of Present Illness 80 yo male admitted with hypertensiv crisis, fall at home, R rib fx, pleural effusion, back pain. Hx of DVT, CKD, PAfib, HF, MDs, osteopenia, HTN, hemorrhagic cystitisi, anemai, C-diff    PT Comments    Pt able to ambulate short distance in room with recliner follow due to weakness.  Con't to recommend SNF.  Follow Up Recommendations  SNF     Equipment Recommendations  None recommended by PT    Recommendations for Other Services       Precautions / Restrictions Precautions Precautions: Fall    Mobility  Bed Mobility Overal bed mobility: Needs Assistance Bed Mobility: Supine to Sit     Supine to sit: Mod assist     General bed mobility comments: Pt initiated transfer and able to take LE off bed, but needed MOD A with trunk  Transfers Overall transfer level: Needs assistance Equipment used: Rolling walker (2 wheeled) Transfers: Sit to/from Stand Sit to Stand: Mod assist;+2 safety/equipment;From elevated surface         General transfer comment: MOD A with momentum   Ambulation/Gait Ambulation/Gait assistance: Min assist;+2 safety/equipment Ambulation Distance (Feet): 8 Feet Assistive device: Rolling walker (2 wheeled) Gait Pattern/deviations: Decreased step length - right;Decreased step length - left;Trunk flexed Gait velocity: decreased   General Gait Details: Decreased step length with flexed posture and chair brought behind him.   Stairs            Wheelchair Mobility    Modified Rankin (Stroke Patients Only)       Balance             Standing balance-Leahy Scale: Poor Standing balance comment: requires RW                    Cognition Arousal/Alertness: Awake/alert Behavior During Therapy: Flat affect Overall Cognitive Status: Within Functional Limits for tasks  assessed                      Exercises      General Comments        Pertinent Vitals/Pain Pain Assessment: Faces Faces Pain Scale: Hurts little more Pain Location: belly Pain Descriptors / Indicators: Grimacing Pain Intervention(s): Limited activity within patient's tolerance;Monitored during session;Repositioned    Home Living                      Prior Function            PT Goals (current goals can now be found in the care plan section) Acute Rehab PT Goals Patient Stated Goal: to go to rehab to get stronger PT Goal Formulation: With patient Time For Goal Achievement: 07/09/16 Potential to Achieve Goals: Good Progress towards PT goals: Progressing toward goals    Frequency    Min 3X/week      PT Plan Current plan remains appropriate    Co-evaluation             End of Session Equipment Utilized During Treatment: Gait belt Activity Tolerance: Patient limited by fatigue Patient left: in chair;with call bell/phone within reach;with chair alarm set     Time: 1142-1200 PT Time Calculation (min) (ACUTE ONLY): 18 min  Charges:  $Gait Training: 8-22 mins  G Codes:      [name] 06/28/2016, 12:55 PM

## 2016-06-28 NOTE — Progress Notes (Deleted)
Pt has been voiding, urine output has been small amounts. 50 cc and 25 cc. Bladder scan completed, 511 cc's. Will update provider. Awaiting orders 

## 2016-06-28 NOTE — Care Management Important Message (Signed)
Important Message  Patient Details IM Letter given to Cookie/Case Mananger to present to Patient  Name: [name] Sr. MRN: [id number] Date of Birth: Dec 24, 1935   Medicare Important Message Given:  Yes    Kerin Salen 06/28/2016, 11:50 AM

## 2016-06-28 NOTE — Clinical Social Work Placement (Signed)
CSW provided patient, wife & daughter [name] with SNF bed offers, unfortunately their first choice - [name] was unable to offer a bed. Patient & wife expressed interest in [facility], wife plans to tour & daughter states that she will get back to CSW with SNF decision once it has been made.   [name], LCSW [facility] Clinical Social Worker cell #: [phone]   CLINICAL SOCIAL WORK PLACEMENT  NOTE  Date:  06/28/2016  Patient Details  Name: [name] BRINTNALL Sr. MRN: [id number] Date of Birth: 1936-05-01  Clinical Social Work is seeking post-discharge placement for this patient at the [place] level of care (*CSW will initial, date and re-position this form in  chart as items are completed):  Yes   Patient/family provided with [place] Work Department's list of facilities offering this level of care within the geographic area requested by the patient (or if unable, by the patient's family).  Yes   Patient/family informed of their freedom to choose among providers that offer the needed level of care, that participate in Medicare, Medicaid or managed care program needed by the patient, have an available bed and are willing to accept the patient.  Yes   Patient/family informed of [facility]'s ownership interest in [facility] and [facility], as well as of the fact that they are under no obligation to receive care at these facilities.  PASRR submitted to EDS on       PASRR number received on       Existing PASRR number confirmed on 06/28/16     FL2 transmitted to all facilities in geographic area requested by pt/family on 06/28/16     FL2 transmitted to all facilities within larger geographic area on       Patient informed that his/her managed care company has contracts with or will negotiate with certain facilities, including the following:        Yes   Patient/family informed of bed offers  received.  Patient chooses bed at       Physician recommends and patient chooses bed at      Patient to be transferred to   on  .  Patient to be transferred to facility by       Patient family notified on   of transfer.  Name of family member notified:        PHYSICIAN       Additional Comment:    _______________________________________________ Standley Brooking, LCSW 06/28/2016, 4:03 PM

## 2016-06-28 NOTE — Discharge Planning (Signed)
[facility]  Patient Status: ACTIVE  [facility] is providing the following services: PT,SN,SW  If patient discharges after hours, please call [phone].   [name] 06/28/2016, 8:46 AM

## 2016-06-29 LAB — BASIC METABOLIC PANEL
ANION GAP: 8 (ref 5–15)
BUN: 56 mg/dL — AB (ref 4–21)
BUN: 56 mg/dL — ABNORMAL HIGH (ref 6–20)
CALCIUM: 8.2 mg/dL — AB (ref 8.9–10.3)
CHLORIDE: 106 mmol/L (ref 101–111)
CO2: 23 mmol/L (ref 22–32)
Creatinine, Ser: 3.06 mg/dL — ABNORMAL HIGH (ref 0.61–1.24)
Creatinine: 3.1 mg/dL — AB (ref 0.6–1.3)
GFR calc Af Amer: 21 mL/min — ABNORMAL LOW (ref >60)
GFR calc non Af Amer: 18 mL/min — ABNORMAL LOW (ref >60)
GLUCOSE: 111 mg/dL
GLUCOSE: 111 mg/dL — AB (ref 65–99)
Potassium: 3.6 mmol/L (ref 3.4–5.3)
Potassium: 3.6 mmol/L (ref 3.5–5.1)
SODIUM: 137 mmol/L (ref 137–147)
Sodium: 137 mmol/L (ref 135–145)

## 2016-06-29 LAB — CBC AND DIFFERENTIAL
HEMATOCRIT: 26 % — AB (ref 41–53)
HEMOGLOBIN: 8.4 g/dL — AB (ref 13.5–17.5)
Platelets: 191 10*3/uL (ref 150–399)
WBC: 17.8 10^3/mL

## 2016-06-29 LAB — CBC
HEMATOCRIT: 25.6 % — AB (ref 39.0–52.0)
HEMOGLOBIN: 8.4 g/dL — AB (ref 13.0–17.0)
MCH: 29.2 pg (ref 26.0–34.0)
MCHC: 32.8 g/dL (ref 30.0–36.0)
MCV: 88.9 fL (ref 78.0–100.0)
Platelets: 191 10*3/uL (ref 150–400)
RBC: 2.88 MIL/uL — ABNORMAL LOW (ref 4.22–5.81)
RDW: 19.4 % — AB (ref 11.5–15.5)
WBC: 17.8 10*3/uL — AB (ref 4.0–10.5)

## 2016-06-29 LAB — PROTIME-INR
INR: 4.17 — AB
Prothrombin Time: 41.4 seconds — ABNORMAL HIGH (ref 11.4–15.2)

## 2016-06-29 MED ORDER — HYDRALAZINE HCL 50 MG PO TABS
100.0000 mg | ORAL_TABLET | Freq: Three times a day (TID) | ORAL | Status: DC
  Administered 2016-06-29 – 2016-07-01 (×7): 100 mg via ORAL
  Filled 2016-06-29 (×6): qty 2

## 2016-06-29 NOTE — Progress Notes (Signed)
[facility] for Warfarin Indication: Atrial fibrillation, hx of DVT  Allergies  Allergen Reactions  . Nsaids Nausea Only    Reaction:  GI upset   . Ibuprofen Other (See Comments)    Reaction:  GI upset   . Ace Inhibitors Cough   Patient Measurements: Height: 5\' 10"  (177.8 cm) Weight: 180 lb (81.6 kg) IBW/kg (Calculated) : 73  Vital Signs: Temp: 98.2 F (36.8 C) (12/19 0508) Temp Source: Oral (12/19 0508) BP: 191/67 (12/19 0913) Pulse Rate: 85 (12/19 0913)  Labs:  Recent Labs  06/27/16 0543  06/27/16 0756 06/28/16 0401 06/29/16 0455  HGB  --   < > 9.7* 8.3* 8.4*  HCT  --   --  29.5* 24.7* 25.6*  PLT  --   --  214 200 191  LABPROT 39.4*  --   --  39.4* 41.4*  INR 3.92  --   --  3.93 4.17*  CREATININE 3.13*  --   --  2.93* 3.06*  < > = values in this interval not displayed. Estimated Creatinine Clearance: 20.5 mL/min (by C-G formula based on SCr of 3.06 mg/dL (H)).  Medications:  Scheduled:  . amiodarone  100 mg Oral Daily  . atorvastatin  80 mg Oral QHS  . carvedilol  25 mg Oral BID WC  . hydrALAZINE  75 mg Oral Q8H  . isosorbide mononitrate  120 mg Oral Daily  . levothyroxine  50 mcg Oral QAC breakfast  . piperacillin-tazobactam (ZOSYN)  IV  3.375 g Intravenous Q8H  . potassium chloride SA  20 mEq Oral BID  . sodium chloride flush  3 mL Intravenous Q12H  . tamsulosin  0.4 mg Oral QPC supper  . vancomycin  500 mg Oral Q6H  . Warfarin - Pharmacist Dosing Inpatient   Does not apply q1800   Assessment: 77 yoM to ED 12/14 with weakness, fall at home 3 days PTA. Admit INR 2.45 - Patient stated home regimen is 12mg  daily - Coag clinic visit 12/12 with INR 3.7, hold Warf 12/13 then resume 6mg  daily    Today, 06/29/2016  INR still supratherapeutic after doses of 6mg  and 4mg  as inpatient and no doses x past 3 days  CBC: Hgb low and dropped. Plts stable  Drug interactions: no new significant interactions (amiodarone is home  med and regimen already adjusted for this)  Note patient stated home dose 12mg  daily vs coumadin clinic notes to be taking 6mg  daily (as on 12/12)  Goal of Therapy:  INR 2-3 Monitor platelets by anticoagulation protocol: Yes   Plan:   Continue to hold warfarin today  Daily INR   Dolly Rias RPh 06/29/2016, 9:29 AM Pager [phone]

## 2016-06-29 NOTE — Progress Notes (Addendum)
PROGRESS NOTE                                                                                                                                                                                                             Patient Demographics:    [name], is a 80 y.o. male, DOB - 05-02-1936, TX:[id number]  Admit date - 06/24/2016   Admitting Physician Lily Kocher, MD  Outpatient Primary MD for the patient is [name], MD  LOS - 5  Outpatient Specialists: cardiology  Chief Complaint  Patient presents with  . Weakness       Brief Narrative   80 year old male with multiple comorbidities including CAD with history of CABG, PCI, PAF/sick sinus syndrome status post PPM (was removed recently due to systemic infection), taken off anticoagulation due to bleeding complications, chronic diastolic CHF, hypertension, diabetes mellitus, CAD stage IV, myelodysplastic syndrome and hypothyroidism was brought to the ED by his wife and daughter with generalized weakness and fall at home 3 days prior to admission. 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 difficile colitis and pseudomonal UTI. He was discharged on Coumadin for DVT. Patient fell at home 3 days prior to admission stating that his legs gave away. No loss of consciousness but landed on his eyes and had pain in his right shoulder and mid to lower back. Denies any bowel or urinary symptoms. Denies chest pain, palpitations, but has increased dyspnea on exertion. Reports good appetite. Family have been looking for SNF options.  In the ED He was hypertensive with systolic blood pressure in 200s. Labs showed WBC of 12, mildly elevated BUN and creatinine (53/2.79), UA positive for UTI. MRI of brain was negative for acute intracranial activity. MRI of the lumbar spine pending. admitted to  stepdown for further management.      Subjective:   Still having some diarrhea. Complains of abdominal pain. Stool for C. difficile was positive.   Assessment  & Plan :    Principal Problem:   Active Problems: Acute C. difficile colitis  CT scan done for right hydronephrosis showing diffuse colitis and? Proctitis. Has some abdominal tenderness. Stool for C. difficile positive. Patient was on prolonged antibiotics until recently.  Added oral vancomycin. Treat for at least 2 weeks.Has some abdominal  distention today. Since he remains afebrile and leukocytosis improving with decrease in frequency of diarrhea will monitor for another day. If has persistent symptoms he will need repeat CT scan of the abdomen.    Acute on CKD (chronic kidney disease), stage IV (HCC) Worsened renal fn with moderate right-sided hydronephrosis seen on Imaging. Hx of hemorrhagic cystitis with bladder clot, previously requiring CBI and foley for several months.  -urology consult appreciated. Foley placed in with good urine output. Recommend to leave Foley catheter for now and monitor. Reevaluate with renal ultrasound in 2-3 days. -Renal function improved minimally. Monitor urine output. Repeat renal ultrasound tomorrow.    Hypertensive crisis On multiple medications which is continued (Coreg, clonidine, hydralazine, Imdur).Increase hydralazine dose further today.  Fall with leg weakness and low back pain. MRI negative for stroke. MRI of the lumbar spine without acute injury. ? Myositis.  PT evaluation. Family wanting patient to be go to SNF. Patient agrees.    Recurrent Pseudomonas UTI Cefepime switched to Zosyn to cover intra-abdominal infection.  Acute on chronic diastolic CHF Improved with IV Lasix. Held further given improvement and worsened renal function. Continue beta blocker and Imdur.  PAF (paroxysmal atrial fibrillation) (HCC) Rate controlled. Continue beta blocker and amiodarone. On warfarin  for upper extremity DVT. Stool for Hemoccult positive. Mild drop in H&H. Monitor closely.  Anemia of chronic disease Stable. Stool for Hemoccult positive. Monitor while on anticoagulants.  Low back pain and lower extremity weakness Pain control with when necessary Vicodin and low-dose Dilaudid. MRI of the back negative for acute injury. PT recommends SNF.   BPH Continue Flomax.  Recent upper extremity DVT On warfarin. Dosing per pharmacy. Monitor H&H closely.  CAD with history of CABG (94 and 2013) Follows with Dr. Tamala Julian. Continue beta blocker.  History of PAF/sick sinus syndrome Pacemaker removed recently due to infection. Taken off chronic anticoagulation due to bleeding complications ( on coumadin for DVT only)  Hypothyroidism Continue Synthroid.  Dependent sacral pressure ulcer Wound care evaluation.  Code Status : Full code  Family Communication  : will update family.  Disposition Plan  : needs SNF. Will possibly be end of the week before he is stable to be discharged given ongoing issues.  Barriers For Discharge : Active symptoms  Consults  :   urology  Procedures  :  MRI brain MRI lumbar spine  DVT Prophylaxis  :  Coumadin  Lab Results  Component Value Date   PLT 191 06/29/2016    Antibiotics  :   Anti-infectives    Start     Dose/Rate Route Frequency Ordered Stop   06/27/16 1200  vancomycin (VANCOCIN) 50 mg/mL oral solution 500 mg     500 mg Oral Every 6 hours 06/27/16 0938 07/11/16 1159   06/27/16 1000  piperacillin-tazobactam (ZOSYN) IVPB 3.375 g     3.375 g 12.5 mL/hr over 240 Minutes Intravenous Every 8 hours 06/27/16 0943     06/27/16 0800  metroNIDAZOLE (FLAGYL) tablet 500 mg  Status:  Discontinued     500 mg Oral Every 8 hours 06/27/16 0749 06/27/16 0938   06/25/16 2200  ceFEPIme (MAXIPIME) 1 g in dextrose 5 % 50 mL IVPB  Status:  Discontinued     1 g 100 mL/hr over 30 Minutes Intravenous Every 24 hours 06/24/16 2229 06/27/16 0938    06/24/16 2145  ceFEPIme (MAXIPIME) 2 g in dextrose 5 % 50 mL IVPB     2 g 100 mL/hr over 30 Minutes Intravenous  Once 06/24/16 2135 06/25/16 0022        Objective:   Vitals:   06/28/16 1500 06/28/16 2145 06/29/16 0508 06/29/16 0913  BP: (!) 165/56 (!) 160/52 (!) 166/60 (!) 191/67  Pulse: 66 67 69 85  Resp:  12 16   Temp:  98.7 F (37.1 C) 98.2 F (36.8 C)   TempSrc:  Oral Oral   SpO2:  95% 96%   Weight:   81.6 kg (180 lb)   Height:        Wt Readings from Last 3 Encounters:  06/29/16 81.6 kg (180 lb)  06/07/16 78.4 kg (172 lb 12.8 oz)  06/03/16 83.1 kg (183 lb 3.2 oz)     Intake/Output Summary (Last 24 hours) at 06/29/16 1254 Last data filed at 06/29/16 0517  Gross per 24 hour  Intake              340 ml  Output              725 ml  Net             -385 ml     Physical Exam  Gen: Ears extremely fatigued HEENT:  moist mucosa, supple neck Chest: clear b/l, no added sounds CVS: N S1&S2, no murmurs,  GI: soft, Mild abdominal distention, bowel sounds present, as midabdominal tenderness Musculoskeletal: warm, edema resolved, Foley + CNS: Alert and oriented, non focal    Data Review:    CBC  Recent Labs Lab 06/24/16 1556 06/24/16 1606 06/26/16 0507 06/27/16 0756 06/28/16 0401 06/29/16 0455  WBC 12.0*  --  20.4* 25.3* 20.2* 17.8*  HGB 10.8* 12.2* 9.1* 9.7* 8.3* 8.4*  HCT 32.4* 36.0* 27.5* 29.5* 24.7* 25.6*  PLT 221  --  196 214 200 191  MCV 89.8  --  91.1 89.7 88.5 88.9  MCH 29.9  --  30.1 29.5 29.7 29.2  MCHC 33.3  --  33.1 32.9 33.6 32.8  RDW 18.7*  --  19.5* 19.2* 19.2* 19.4*  LYMPHSABS 1.3  --   --   --   --   --   MONOABS 1.1*  --   --   --   --   --   EOSABS 0.0  --   --   --   --   --   BASOSABS 0.0  --   --   --   --   --     Chemistries   Recent Labs Lab 06/24/16 1556  06/25/16 0340 06/26/16 0507 06/27/16 0543 06/28/16 0401 06/29/16 0455  NA 135  < > 133* 136 134* 135 137  K 3.8  < > 3.3* 4.0 3.3* 3.5 3.6  CL 103  < > 104 106  104 105 106  CO2 23  --  20* 20* 19* 21* 23  GLUCOSE 131*  < > 175* 125* 115* 145* 111*  BUN 53*  < > 54* 56* 58* 57* 56*  CREATININE 2.79*  < > 2.64* 3.34* 3.13* 2.93* 3.06*  CALCIUM 8.8*  --  8.2* 8.4* 8.3* 8.1* 8.2*  MG 1.9  --   --   --   --   --   --   AST 19  --   --   --   --   --   --   ALT 9*  --   --   --   --   --   --   ALKPHOS 52  --   --   --   --   --   --  BILITOT 0.5  --   --   --   --   --   --   < > = values in this interval not displayed. ------------------------------------------------------------------------------------------------------------------ No results for input(s): CHOL, HDL, LDLCALC, TRIG, CHOLHDL, LDLDIRECT in the last 72 hours.  Lab Results  Component Value Date   HGBA1C 4.9 02/20/2016   ------------------------------------------------------------------------------------------------------------------ No results for input(s): TSH, T4TOTAL, T3FREE, THYROIDAB in the last 72 hours.  Invalid input(s): FREET3 ------------------------------------------------------------------------------------------------------------------ No results for input(s): VITAMINB12, FOLATE, FERRITIN, TIBC, IRON, RETICCTPCT in the last 72 hours.  Coagulation profile  Recent Labs Lab 06/25/16 0340 06/26/16 0507 06/27/16 0543 06/28/16 0401 06/29/16 0455  INR 2.67 3.71 3.92 3.93 4.17*    No results for input(s): DDIMER in the last 72 hours.  Cardiac Enzymes No results for input(s): CKMB, TROPONINI, MYOGLOBIN in the last 168 hours.  Invalid input(s): CK ------------------------------------------------------------------------------------------------------------------    Component Value Date/Time   BNP 1,430.8 (H) 06/25/2016 0340   BNP 891.5 (H) 06/18/2016 1627    Inpatient Medications  Scheduled Meds: . amiodarone  100 mg Oral Daily  . atorvastatin  80 mg Oral QHS  . carvedilol  25 mg Oral BID WC  . hydrALAZINE  75 mg Oral Q8H  . isosorbide mononitrate  120 mg  Oral Daily  . levothyroxine  50 mcg Oral QAC breakfast  . piperacillin-tazobactam (ZOSYN)  IV  3.375 g Intravenous Q8H  . potassium chloride SA  20 mEq Oral BID  . sodium chloride flush  3 mL Intravenous Q12H  . tamsulosin  0.4 mg Oral QPC supper  . vancomycin  500 mg Oral Q6H  . Warfarin - Pharmacist Dosing Inpatient   Does not apply q1800   Continuous Infusions: PRN Meds:.sodium chloride, acetaminophen, HYDROcodone-acetaminophen, HYDROmorphone (DILAUDID) injection, ondansetron (ZOFRAN) IV, sodium chloride flush  Micro Results Recent Results (from the past 240 hour(s))  MRSA PCR Screening     Status: None   Collection Time: 06/24/16 10:41 PM  Result Value Ref Range Status   MRSA by PCR NEGATIVE NEGATIVE Final    Comment:        The GeneXpert MRSA Assay (FDA approved for NASAL specimens only), is one component of a comprehensive MRSA colonization surveillance program. It is not intended to diagnose MRSA infection nor to guide or monitor treatment for MRSA infections.   Culture, Urine     Status: Abnormal   Collection Time: 06/25/16  2:53 AM  Result Value Ref Range Status   Specimen Description URINE, RANDOM  Final   Special Requests NONE  Final   Culture >=100,000 COLONIES/mL PSEUDOMONAS AERUGINOSA (A)  Final   Report Status 06/27/2016 FINAL  Final   Organism ID, Bacteria PSEUDOMONAS AERUGINOSA (A)  Final      Susceptibility   Pseudomonas aeruginosa - MIC*    CEFTAZIDIME 4 SENSITIVE Sensitive     CIPROFLOXACIN <=0.25 SENSITIVE Sensitive     GENTAMICIN <=1 SENSITIVE Sensitive     IMIPENEM 2 SENSITIVE Sensitive     PIP/TAZO <=4 SENSITIVE Sensitive     CEFEPIME 4 SENSITIVE Sensitive     * >=100,000 COLONIES/mL PSEUDOMONAS AERUGINOSA  C difficile quick scan w PCR reflex     Status: Abnormal   Collection Time: 06/27/16 12:56 PM  Result Value Ref Range Status   C Diff antigen POSITIVE (A) NEGATIVE Final   C Diff toxin POSITIVE (A) NEGATIVE Final   C Diff interpretation  Toxin producing C. difficile detected.  Final    Comment: CRITICAL RESULT CALLED TO, READ  BACK BY AND VERIFIED WITH: S.GAGLIANO RN AT [id number] ON 06/27/16 BY S.VANHOORNE   Gastrointestinal Panel by PCR , Stool     Status: None   Collection Time: 06/27/16 12:56 PM  Result Value Ref Range Status   Campylobacter species NOT DETECTED NOT DETECTED Final   Plesimonas shigelloides NOT DETECTED NOT DETECTED Final   Salmonella species NOT DETECTED NOT DETECTED Final   Yersinia enterocolitica NOT DETECTED NOT DETECTED Final   Vibrio species NOT DETECTED NOT DETECTED Final   Vibrio cholerae NOT DETECTED NOT DETECTED Final   Enteroaggregative E coli (EAEC) NOT DETECTED NOT DETECTED Final   Enteropathogenic E coli (EPEC) NOT DETECTED NOT DETECTED Final   Enterotoxigenic E coli (ETEC) NOT DETECTED NOT DETECTED Final   Shiga like toxin producing E coli (STEC) NOT DETECTED NOT DETECTED Final   Shigella/Enteroinvasive E coli (EIEC) NOT DETECTED NOT DETECTED Final   Cryptosporidium NOT DETECTED NOT DETECTED Final   Cyclospora cayetanensis NOT DETECTED NOT DETECTED Final   Entamoeba histolytica NOT DETECTED NOT DETECTED Final   Giardia lamblia NOT DETECTED NOT DETECTED Final   Adenovirus F40/41 NOT DETECTED NOT DETECTED Final   Astrovirus NOT DETECTED NOT DETECTED Final   Norovirus GI/GII NOT DETECTED NOT DETECTED Final   Rotavirus A NOT DETECTED NOT DETECTED Final   Sapovirus (I, II, IV, and V) NOT DETECTED NOT DETECTED Final    Radiology Reports Ct Abdomen Pelvis Wo Contrast  Result Date: 06/26/2016 CLINICAL DATA:  Right back pain for the past 7 days. Urinary tract infection. Moderate right hydronephrosis on a renal ultrasound earlier today. EXAM: CT ABDOMEN AND PELVIS WITHOUT CONTRAST TECHNIQUE: Multidetector CT imaging of the abdomen and pelvis was performed following the standard protocol without IV contrast. COMPARISON:  Renal ultrasound earlier today. Abdomen and pelvis CT dated 04/08/2016.  FINDINGS: Lower chest: Small bilateral pleural effusions. Mild bilateral lower lobe atelectasis. Hepatobiliary: Liver cysts.  Cholecystectomy clips. Pancreas: Unremarkable. No pancreatic ductal dilatation or surrounding inflammatory changes. Spleen: Normal in size without focal abnormality. Adrenals/Urinary Tract: Moderate dilatation of the right renal collecting system. Tiny upper pole right renal calculus or vascular calcification. No bladder or ureteral calculi a seen. Unremarkable left kidney. Grossly normal adrenal glands. Multiple bladder diverticula. Stomach/Bowel: Moderate diffuse low density wall thickening involving the colon, including the rectum. Grossly unremarkable stomach and small bowel. Vascular/Lymphatic: Extensive, dense, diffuse arterial calcifications, including the coronary arteries and the abdominal aorta and its branches. No enlarged lymph nodes. Reproductive: Normal sized prostate containing coarse calcifications. Other: The examination is limited by streak artifacts produced by an electronic device on the left side of the patient and limited by a diffuse edema of the subcutaneous, intraperitoneal and retroperitoneal fat. Musculoskeletal: Mild lower lumbar spine degenerative changes. IMPRESSION: 1. Interval diffuse colitis and proctitis. 2. Interval moderate right hydronephrosis with no visible cause. 3. Severe diffuse atheromatous arterial calcifications, including coronary artery atherosclerosis and aortic atherosclerosis. 4. Progressive diffuse anasarca. 5. Small bilateral pleural effusions. 6. Mild bilateral lower lobe atelectasis. 7. Multiple bladder diverticula. Electronically Signed   By: Claudie Revering M.D.   On: 06/26/2016 18:48   Dg Chest 2 View  Result Date: 06/24/2016 CLINICAL DATA:  Weakness, recent endocarditis EXAM: CHEST  2 VIEW COMPARISON:  05/25/2016 FINDINGS: Cardiomediastinal silhouette is stable. Status post CABG. Atherosclerotic calcifications of thoracic aorta.  Bilateral small pleural effusion. Streaky bilateral basilar atelectasis or infiltrate right greater than left. No pulmonary edema. IMPRESSION: Status post CABG. Atherosclerotic calcifications of thoracic aorta. Bilateral small pleural effusion. Streaky bilateral  basilar atelectasis or infiltrate right greater than left. No pulmonary edema. Electronically Signed   By: Lahoma Crocker M.D.   On: 06/24/2016 15:05   Dg Ribs Unilateral Right  Result Date: 06/24/2016 CLINICAL DATA:  80 year old male with fall and a right posterior rib pain. EXAM: RIGHT RIBS - 2 VIEW COMPARISON:  Chest radiograph dated 06/24/2016 and chest radiograph dated 04/08/2016 FINDINGS: There is an age indeterminate nondisplaced fracture of the posterolateral aspect of the right ninth rib. The bones are osteopenic which limits evaluation for fracture. No other fracture identified. There is a small right pleural effusion. Right lung base opacity may represent atelectatic changes versus infiltrate. Trace fluid noted in the minor fissure. There is cardiomegaly. Median sternotomy wires and CABG surgical clips noted. There is atherosclerotic calcification of the aorta. There is degenerative changes of the spine. Right upper quadrant cholecystectomy clips noted. IMPRESSION: Nondisplaced fracture of the posterior aspect of the right ninth rib, age indeterminate. No pneumothorax. Small right pleural effusion with right lung base atelectasis versus infiltrate. Electronically Signed   By: Anner Crete M.D.   On: 06/24/2016 23:28   Mr Brain Wo Contrast  Result Date: 06/24/2016 CLINICAL DATA:  LEFT lower extremity weakness. History of prostate cancer, diabetes. EXAM: MRI HEAD WITHOUT CONTRAST TECHNIQUE: Multiplanar, multiecho pulse sequences of the brain and surrounding structures were obtained without intravenous contrast. COMPARISON:  CT HEAD February 20, 2016 FINDINGS: BRAIN: No reduced diffusion to suggest acute ischemia or hypercellular tumor.  Punctate chronic microhemorrhage LEFT parietal lobe. The ventricles and sulci are normal for patient's age. Patchy to confluent supratentorial white matter FLAIR T2 hyperintensities. No suspicious parenchymal signal, masses or mass effect. Multiple small cortical infarcts. Prominent symmetric perivascular spaces, normal variant. No abnormal extra-axial fluid collections. No extra-axial masses though, contrast enhanced sequences would be more sensitive. VASCULAR: Normal major intracranial vascular flow voids present at skull base. SKULL AND UPPER CERVICAL SPINE: No abnormal sellar expansion. No suspicious calvarial bone marrow signal. Craniocervical junction maintained. Trace atlantodental fusion. SINUSES/ORBITS: Lobulated LEFT maxillary sinus mucosal thickening on, trace remaining paranasal sinus mucosal thickening. Trace mastoid effusions. The included ocular globes and orbital contents are non-suspicious. OTHER: None. IMPRESSION: No acute intracranial process. Involutional changes. Moderate chronic small vessel ischemic disease. Electronically Signed   By: Elon Alas M.D.   On: 06/24/2016 21:16   Mr Lumbar Spine Wo Contrast  Result Date: 06/25/2016 CLINICAL DATA:  80 year old male with generalized weakness for 3 days. Lumbar back pain after a fall. Leg weakness. Initial encounter. EXAM: MRI LUMBAR SPINE WITHOUT CONTRAST TECHNIQUE: Multiplanar, multisequence MR imaging of the lumbar spine was performed. No intravenous contrast was administered. COMPARISON:  CT Abdomen and Pelvis 04/08/2016 FINDINGS: MRI imaging quality of the lumbar spine is significantly degraded by generalize loss of signal despite repeated imaging attempts. The etiology of the signal loss seems to be generalized soft tissue edema such as anasarca. Segmentation: Normal as seen on the comparison CT Abdomen and Pelvis. Alignment: Stable since the prior CT. Mild grade 1 anterolisthesis of L5 on S1. Mild dextroconvex lumbar scoliosis.  Vertebrae: Suboptimal characterization of marrow due to the decreased signal to noise. No definite marrow edema or acute osseous abnormality. Conus medullaris: Extends to the L1 level and appears normal. Paraspinal and other soft tissues: Diffuse soft tissue edema suspected such is anasarca. This also seems to affect the visible paraspinal muscles bilaterally. Very limited visualization of abdominal viscera. The visualized kidneys appear stable to the prior CT. Disc levels: No significant lumbar spinal  stenosis. Mild lumbar spine degeneration above L4. At L4-L5 there is disc bulging and moderate to severe facet and ligament flavum hypertrophy. Both facet joints are capacious and contain fluid. Small posteriorly situated synovial cyst on the left. No significant stenosis. Chronic mild anterolisthesis at L5-S1 with circumferential disc bulge and moderate to severe facet hypertrophy. No spinal stenosis. Up to mild lateral recess stenosis and bilateral L5 foraminal stenosis. IMPRESSION: 1. MRI of the lumbar spine significantly degraded by signal loss despite repeated imaging attempts, which seems to be due to generalized body soft tissue edema such as due to anasarca. 2. Given #1, diffuse lumbar Myositis is difficult to exclude. No acute osseous abnormality identified. 3. No significant lumbar spinal stenosis. Spinal degeneration primarily only at L4-L5 and L5-S1 where there is facet arthropathy in part related to mild spondylolisthesis. Subsequent up to mild multifactorial lateral recess and foraminal stenosis. Electronically Signed   By: Genevie Ann M.D.   On: 06/25/2016 10:29   US Renal  Result Date: 06/26/2016 CLINICAL DATA:  Acute kidney failure EXAM: RENAL / URINARY TRACT ULTRASOUND COMPLETE COMPARISON:  None. FINDINGS: Right Kidney: Length: 10.6 cm. 1.6 cm cyst in the upper pole. Moderate hydronephrosis. Diffusely increased echotexture throughout the right kidney. Left Kidney: Length: 10.1 cm. Increased  echotexture throughout the left kidney. No hydronephrosis or mass. Bladder: Layering debris dependently in the bladder. Mild bladder wall irregularity and thickening. IMPRESSION: Moderate right hydronephrosis. Increased echotexture within the kidneys bilaterally compatible with chronic medical renal disease. Irregular wall thickening throughout the bladder. While this could be related to bladder outlet obstruction, I cannot exclude infection or neoplasm. Recommend clinical correlation. Electronically Signed   By: Rolm Baptise M.D.   On: 06/26/2016 10:50    Time Spent in minutes  35   Louellen Molder M.D on 06/29/2016 at 12:54 PM  Between 7am to 7pm - Pager - [phone]  After 7pm go to [email or web address] - password [facility]  [facility] -  Office  [phone]

## 2016-06-29 NOTE — Clinical Social Work Placement (Signed)
Patient has a bed at [facility]. CSW has completed FL2 & will continue to follow and assist with discharge when ready.    [name], LCSW [facility] Clinical Social Worker cell #: [phone]     CLINICAL SOCIAL WORK PLACEMENT  NOTE  Date:  06/29/2016  Patient Details  Name: [name] NABB Sr. MRN: [id number] Date of Birth: [birth year]  Clinical Social Work is seeking post-discharge placement for this patient at the [place] level of care (*CSW will initial, date and re-position this form in  chart as items are completed):  Yes   Patient/family provided with [facility] Work Department's list of facilities offering this level of care within the geographic area requested by the patient (or if unable, by the patient's family).  Yes   Patient/family informed of their freedom to choose among providers that offer the needed level of care, that participate in Medicare, Medicaid or managed care program needed by the patient, have an available bed and are willing to accept the patient.  Yes   Patient/family informed of [facility]'s ownership interest in [facility] and [facility], as well as of the fact that they are under no obligation to receive care at these facilities.  PASRR submitted to EDS on       PASRR number received on       Existing PASRR number confirmed on 06/28/16     FL2 transmitted to all facilities in geographic area requested by pt/family on 06/28/16     FL2 transmitted to all facilities within larger geographic area on       Patient informed that his/her managed care company has contracts with or will negotiate with certain facilities, including the following:        Yes   Patient/family informed of bed offers received.  Patient chooses bed at [facility] and [facility] recommends and patient chooses bed at      Patient to be transferred to [facility] on   .  Patient to be transferred to facility by       Patient family notified on   of transfer.  Name of family member notified:        PHYSICIAN       Additional Comment:    _______________________________________________ Standley Brooking, LCSW 06/29/2016, 11:30 AM

## 2016-06-29 NOTE — Plan of Care (Signed)
Problem: Skin Integrity: Goal: Risk for impaired skin integrity will decrease Outcome: Completed/Met Date Met: 06/29/16 Patient has a stage II MASD area to sacrum. Pt is on an air

## 2016-06-29 NOTE — Progress Notes (Signed)
CRITICAL VALUE ALERT  Critical value received:  INR 4.17  Date of notification:  06/29/16  Time of notification:  05:37  Critical value read back:Yes.    Nurse who received alert:  Ruben Im, RN  MD notified (1st page):  K Schorr  Time of first page:  05:43  MD notified (2nd page):  Time of second page:  Responding MD:    Time MD responded:  No return call NP notified via text page

## 2016-06-30 ENCOUNTER — Inpatient Hospital Stay (HOSPITAL_COMMUNITY): Payer: Medicare Other

## 2016-06-30 DIAGNOSIS — I169 Hypertensive crisis, unspecified: Secondary | ICD-10-CM

## 2016-06-30 LAB — CBC
HEMATOCRIT: 27.5 % — AB (ref 39.0–52.0)
HEMOGLOBIN: 9.1 g/dL — AB (ref 13.0–17.0)
MCH: 29.2 pg (ref 26.0–34.0)
MCHC: 33.1 g/dL (ref 30.0–36.0)
MCV: 88.1 fL (ref 78.0–100.0)
Platelets: 212 10*3/uL (ref 150–400)
RBC: 3.12 MIL/uL — AB (ref 4.22–5.81)
RDW: 19.1 % — ABNORMAL HIGH (ref 11.5–15.5)
WBC: 12.8 10*3/uL — ABNORMAL HIGH (ref 4.0–10.5)

## 2016-06-30 LAB — BASIC METABOLIC PANEL
Anion gap: 8 (ref 5–15)
BUN: 53 mg/dL — AB (ref 4–21)
BUN: 53 mg/dL — AB (ref 6–20)
CHLORIDE: 107 mmol/L (ref 101–111)
CO2: 21 mmol/L — AB (ref 22–32)
CREATININE: 2.8 mg/dL — AB (ref 0.6–1.3)
CREATININE: 2.82 mg/dL — AB (ref 0.61–1.24)
Calcium: 8.3 mg/dL — ABNORMAL LOW (ref 8.9–10.3)
GFR calc Af Amer: 23 mL/min — ABNORMAL LOW (ref >60)
GFR calc non Af Amer: 20 mL/min — ABNORMAL LOW (ref >60)
GLUCOSE: 112 mg/dL
GLUCOSE: 112 mg/dL — AB (ref 65–99)
POTASSIUM: 3.3 mmol/L — AB (ref 3.5–5.1)
Sodium: 136 mmol/L — AB (ref 137–147)
Sodium: 136 mmol/L (ref 135–145)

## 2016-06-30 LAB — PROTIME-INR
INR: 3.4
Prothrombin Time: 35.2 seconds — ABNORMAL HIGH (ref 11.4–15.2)

## 2016-06-30 LAB — CBC AND DIFFERENTIAL: WBC: 12.8 10*3/mL

## 2016-06-30 NOTE — Progress Notes (Signed)
[place] for Warfarin Indication: Atrial fibrillation, hx of DVT  Allergies  Allergen Reactions  . Nsaids Nausea Only    Reaction:  GI upset   . Ibuprofen Other (See Comments)    Reaction:  GI upset   . Ace Inhibitors Cough   Patient Measurements: Height: 5\' 10"  (177.8 cm) Weight: 189 lb (85.7 kg) IBW/kg (Calculated) : 73  Vital Signs: Temp: 98.6 F (37 C) (12/20 0640) Temp Source: Oral (12/20 0640) BP: 178/70 (12/20 0640) Pulse Rate: 77 (12/20 0640)  Labs:  Recent Labs  06/28/16 0401 06/29/16 0455 06/30/16 0520  HGB 8.3* 8.4* 9.1*  HCT 24.7* 25.6* 27.5*  PLT 200 191 212  LABPROT 39.4* 41.4* 35.2*  INR 3.93 4.17* 3.40  CREATININE 2.93* 3.06* 2.82*   Estimated Creatinine Clearance: 22.3 mL/min (by C-G formula based on SCr of 2.82 mg/dL (H)).  Medications:  Scheduled:  . amiodarone  100 mg Oral Daily  . atorvastatin  80 mg Oral QHS  . carvedilol  25 mg Oral BID WC  . hydrALAZINE  100 mg Oral Q8H  . isosorbide mononitrate  120 mg Oral Daily  . levothyroxine  50 mcg Oral QAC breakfast  . piperacillin-tazobactam (ZOSYN)  IV  3.375 g Intravenous Q8H  . potassium chloride SA  20 mEq Oral BID  . sodium chloride flush  3 mL Intravenous Q12H  . tamsulosin  0.4 mg Oral QPC supper  . vancomycin  500 mg Oral Q6H  . Warfarin - Pharmacist Dosing Inpatient   Does not apply q1800   Assessment: 67 yoM to ED 12/14 with weakness, fall at home 3 days PTA. Admit INR 2.45 - Patient stated home regimen is 12mg  daily - Coag clinic visit 12/12 with INR 3.7, hold Warf 12/13 then resume 6mg  daily    Today, 06/30/2016  INR still supratherapeutic after doses of 6mg  and 4mg  as inpatient and no doses x past 4 days  CBC: Hgb low but stable. Plts WNL  Drug interactions: no new significant interactions (amiodarone is home med and regimen already adjusted for this)  Note patient stated home dose 12mg  daily vs coumadin clinic notes to be taking  6mg  daily (as on 12/12)  Goal of Therapy:  INR 2-3 Monitor platelets by anticoagulation protocol: Yes   Plan:   Continue to hold warfarin today  Daily INR   Dolly Rias RPh 06/30/2016, 1:01 PM Pager [phone]

## 2016-06-30 NOTE — Progress Notes (Signed)
Physical Therapy Treatment Patient Details Name: DARCY SCHLEPP Sr. MRN: [id number] DOB: Sep 05, 1935 Today's Date: 07/17/16    History of Present Illness 80 yo male admitted with hypertensive crisis, fall at home, R rib fx, pleural effusion, back pain. Hx of DVT, CKD, PAfib, HF, MDs, osteopenia, HTN, hemorrhagic cystitisi, anemai, C-diff    PT Comments    Pt with loose BM upon arrival, NT in to assist with pericare while PT assisted with pt rolling.  Pt then assisted to standing however very weak and fatigues quickly.  Pt assisted back to bed.  Follow Up Recommendations  SNF     Equipment Recommendations  None recommended by PT    Recommendations for Other Services       Precautions / Restrictions Precautions Precautions: Fall Precaution Comments: having some loose BM's, much weaker    Mobility  Bed Mobility Overal bed mobility: Needs Assistance Bed Mobility: Rolling;Supine to Sit;Sit to Supine Rolling: Mod assist   Supine to sit: Mod assist Sit to supine: Mod assist   General bed mobility comments: turning requiring assist for LE positioning and hips, assist for upper and lower body  Transfers Overall transfer level: Needs assistance Equipment used: Rolling walker (2 wheeled) Transfers: Sit to/from Stand Sit to Stand: Mod assist;+2 safety/equipment;From elevated surface         General transfer comment: verbal cues for technique, assist to rise, steady and control descent, pt with loose BM upon standing, too weak for further activity per pt  Ambulation/Gait                 Stairs            Wheelchair Mobility    Modified Rankin (Stroke Patients Only)       Balance                                    Cognition Arousal/Alertness: Awake/alert Behavior During Therapy: Flat affect Overall Cognitive Status: Within Functional Limits for tasks assessed                      Exercises      General Comments         Pertinent Vitals/Pain Pain Assessment: Faces Faces Pain Scale: Hurts even more Pain Location: abdomen Pain Descriptors / Indicators: Grimacing Pain Intervention(s): Monitored during session;Limited activity within patient's tolerance    Home Living                      Prior Function            PT Goals (current goals can now be found in the care plan section) Progress towards PT goals: Progressing toward goals    Frequency    Min 3X/week      PT Plan Current plan remains appropriate    Co-evaluation             End of Session Equipment Utilized During Treatment: Gait belt Activity Tolerance: Patient limited by fatigue Patient left: in bed;with call bell/phone within reach     Time: 1127-1200 PT Time Calculation (min) (ACUTE ONLY): 33 min  Charges:  $Therapeutic Activity: 23-37 mins                    G Codes:      Tandi Hanko,KATHrine E 17-Jul-2016, 1:29 PM Carmelia Bake, PT, DPT 2016-07-17 Pager: [phone]

## 2016-06-30 NOTE — Progress Notes (Signed)
Pharmacy Antibiotic Follow-up Note  [name]. is a 80 y.o. year-old male admitted on 06/24/2016.  Patient to ED 12/14 with weakness, fall at home 3 days PTA. Hx of MSSA bacteremia, pacemaker removed, tx with Ancef 7/7-25; + Rifampin to 8/3. Re-admit with CDiff 9/28 tx Po Vanc/IV Flagyl. Found tricuspid vegetation with neg BCx: tx with 14 days po Vanc, 6 wk Ancef to 11/14. Re-admit 11/14 with worsening Cr, PICC removed - found RUE DVT > Warfarin. UCx Pseudomonas tx with Fosfomycin. Pharmacy to dose Zosyn for IAI  Assessment/Plan: -Continue Zosyn 3.375g IV q8 (extended interval infusion) for CrCl > 20 ml/min - Monitor renal function closely with CKD.  Temp (24hrs), Avg:98.7 F (37.1 C), Min:98.6 F (37 C), Max:98.9 F (37.2 C)   Recent Labs Lab 06/26/16 0507 06/27/16 0756 06/28/16 0401 06/29/16 0455 06/30/16 0520  WBC 20.4* 25.3* 20.2* 17.8* 12.8*     Recent Labs Lab 06/26/16 0507 06/27/16 0543 06/28/16 0401 06/29/16 0455 06/30/16 0520  CREATININE 3.34* 3.13* 2.93* 3.06* 2.82*   Estimated Creatinine Clearance: 22.3 mL/min (by C-G formula based on SCr of 2.82 mg/dL (H)).    Allergies  Allergen Reactions  . Nsaids Nausea Only    Reaction:  GI upset   . Ibuprofen Other (See Comments)    Reaction:  GI upset   . Ace Inhibitors Cough   Antimicrobials this admission: 12/14 Cefepime >> 12/17 12/17 Zosyn >>  Levels/dose changes this admission:  Microbiology results: No Cx ordered at this time   Thank you for allowing pharmacy to be a part of this patient's care.   Dolly Rias RPh 06/30/2016, 1:05 PM Pager [phone]

## 2016-06-30 NOTE — Progress Notes (Signed)
PROGRESS NOTE                                                                                                                                                                                                             Patient Demographics:    [name], is a 80 y.o. male, DOB - 10-06-1935, [id number]  Admit date - 06/24/2016   Admitting Physician Lily Kocher, MD  Outpatient Primary MD for the patient is [name], MD  LOS - 6  Outpatient Specialists: cardiology  Chief Complaint  Patient presents with  . Weakness       Brief Narrative   80 year old male with multiple comorbidities including CAD with history of CABG, PCI, PAF/sick sinus syndrome status post PPM (was removed recently due to systemic infection), taken off anticoagulation due to bleeding complications, chronic diastolic CHF, hypertension, diabetes mellitus, CAD stage IV, myelodysplastic syndrome and hypothyroidism was brought to the ED by his wife and daughter with generalized weakness and fall at home 3 days prior to admission. 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 difficile colitis and pseudomonal UTI. He was discharged on Coumadin for DVT. Patient fell at home 3 days prior to admission stating that his legs gave away. No loss of consciousness but landed on his eyes and had pain in his right shoulder and mid to lower back. Denies any bowel or urinary symptoms. Denies chest pain, palpitations, but has increased dyspnea on exertion. Reports good appetite. Family have been looking for SNF options.  In the ED He was hypertensive with systolic blood pressure in 200s. Labs showed WBC of 12, mildly elevated BUN and creatinine (53/2.79), UA positive for UTI. MRI of brain was negative for acute intracranial activity. MRI of the lumbar spine pending. admitted to  stepdown for further management.      Subjective:   Nursing reports patient is still having watery diarrhea. No new complaints reported by the patient.   Assessment  & Plan :    Principal Problem: Hypertensive crisis Better with pain control. Resumed home blood pressure medications (Coreg, clonidine, hydralazine, Imdur).Increase hydralazine dose. - May have to add spironolactone, if continued elevated bp's will add spironolactone  Active Problems: Acute C. difficile colitis Stool for C. difficile positive. Patient was on prolonged antibiotics  until recently.  Added oral vancomycin. Treat for 2 weeks.    Acute on CKD (chronic kidney disease), stage IV (HCC) Worsened renal fn with moderate right-sided hydronephrosis seen on Imaging. Hx of hemorrhagic cystitis with bladder clot, previously requiring CBI and foley for several months. - urology consult appreciated. Foley placed in with good urine output. Recommend to leave Foley catheter for now and monitor. U/S -Renal function improving. Resolved right hydronephrosis  Fall with leg weakness and low back pain. MRI negative for stroke. MRI of the lumbar spine without acute injury. ? Myositis.  PT evaluation. Family wanting patient to be go to SNF. Patient agrees.  Recurrent Pseudomonas UTI Cefepime switched to Zosyn to cover intra-abdominal infection.  Acute on chronic diastolic CHF Improved with IV Lasix. Held further given improvement and worsened renal function. Continue beta blocker and Imdur.     PAF (paroxysmal atrial fibrillation) (HCC) Rate controlled. Continue beta blocker and amiodarone. On warfarin for upper extremity DVT. Stool for Hemoccult positive. Mild drop in H&H. Monitor closely.    Anemia of chronic disease Stable. For Hemoccult positive. Monitor while on anticoagulant is seen.  Low back pain and lower extremity weakness Pain control with when necessary Vicodin and low-dose Dilaudid. MRI of the back negative  for acute injury. PT recommends SNF.   BPH Continue Flomax.  Recent upper extremity DVT On warfarin. Dosing per pharmacy. Monitor H&H closely.   CAD with history of CABG (94 and 2013) Follows with Dr. Tamala Julian. Continue beta blocker.  History of PAF/sick sinus syndrome Pacemaker removed recently due to infection. Not on chronic anticoagulation due to bleeding complete dictations.  Hypothyroidism Continue Synthroid.  Dependent sacral pressure ulcer Wound care evaluation.  Code Status : Full code  Family Communication  : Discussed with daughter at bedside on 12/16.  Disposition Plan  : needs SNF. Possibly in the next 72 hours if improving.  Barriers For Discharge : Active symptoms  Consults  :   urology  Procedures  :  MRI brain MRI lumbar spine  DVT Prophylaxis  :  Coumadin  Lab Results  Component Value Date   PLT 212 06/30/2016    Antibiotics  :   Anti-infectives    Start     Dose/Rate Route Frequency Ordered Stop   06/27/16 1200  vancomycin (VANCOCIN) 50 mg/mL oral solution 500 mg     500 mg Oral Every 6 hours 06/27/16 0938 07/11/16 1159   06/27/16 1000  piperacillin-tazobactam (ZOSYN) IVPB 3.375 g     3.375 g 12.5 mL/hr over 240 Minutes Intravenous Every 8 hours 06/27/16 0943     06/27/16 0800  metroNIDAZOLE (FLAGYL) tablet 500 mg  Status:  Discontinued     500 mg Oral Every 8 hours 06/27/16 0749 06/27/16 0938   06/25/16 2200  ceFEPIme (MAXIPIME) 1 g in dextrose 5 % 50 mL IVPB  Status:  Discontinued     1 g 100 mL/hr over 30 Minutes Intravenous Every 24 hours 06/24/16 2229 06/27/16 0938   06/24/16 2145  ceFEPIme (MAXIPIME) 2 g in dextrose 5 % 50 mL IVPB     2 g 100 mL/hr over 30 Minutes Intravenous  Once 06/24/16 2135 06/25/16 0022        Objective:   Vitals:   06/29/16 1305 06/29/16 1357 06/29/16 2031 06/30/16 0640  BP: (!) 176/63 (!) 162/73 (!) 168/59 (!) 178/70  Pulse:  89 67 77  Resp:  18 18 18   Temp:  98.6 F (37 C) 98.9 F (37.2 C)  98.6 F  (37 C)  TempSrc:  Oral Oral Oral  SpO2:  97% 95% 98%  Weight:    85.7 kg (189 lb)  Height:        Wt Readings from Last 3 Encounters:  06/30/16 85.7 kg (189 lb)  06/07/16 78.4 kg (172 lb 12.8 oz)  06/03/16 83.1 kg (183 lb 3.2 oz)     Intake/Output Summary (Last 24 hours) at 06/30/16 1246 Last data filed at 06/30/16 1100  Gross per 24 hour  Intake              760 ml  Output              550 ml  Net              210 ml     Physical Exam  Gen: Pt in nad, alert and awake HEENT:  moist mucosa, supple neck Chest: clear b/l, no added sounds CVS: N S1&S2, no murmurs, rubs or gallop GI: soft, nondistended, midabdominal tenderness, bowel sounds present Musculoskeletal: warm, edema resolved CNS: Alert and oriented, non focal    Data Review:    CBC  Recent Labs Lab 06/24/16 1556  06/26/16 0507 06/27/16 0756 06/28/16 0401 06/29/16 0455 06/30/16 0520  WBC 12.0*  --  20.4* 25.3* 20.2* 17.8* 12.8*  HGB 10.8*  < > 9.1* 9.7* 8.3* 8.4* 9.1*  HCT 32.4*  < > 27.5* 29.5* 24.7* 25.6* 27.5*  PLT 221  --  196 214 200 191 212  MCV 89.8  --  91.1 89.7 88.5 88.9 88.1  MCH 29.9  --  30.1 29.5 29.7 29.2 29.2  MCHC 33.3  --  33.1 32.9 33.6 32.8 33.1  RDW 18.7*  --  19.5* 19.2* 19.2* 19.4* 19.1*  LYMPHSABS 1.3  --   --   --   --   --   --   MONOABS 1.1*  --   --   --   --   --   --   EOSABS 0.0  --   --   --   --   --   --   BASOSABS 0.0  --   --   --   --   --   --   < > = values in this interval not displayed.  Chemistries   Recent Labs Lab 06/24/16 1556  06/26/16 0507 06/27/16 0543 06/28/16 0401 06/29/16 0455 06/30/16 0520  NA 135  < > 136 134* 135 137 136  K 3.8  < > 4.0 3.3* 3.5 3.6 3.3*  CL 103  < > 106 104 105 106 107  CO2 23  < > 20* 19* 21* 23 21*  GLUCOSE 131*  < > 125* 115* 145* 111* 112*  BUN 53*  < > 56* 58* 57* 56* 53*  CREATININE 2.79*  < > 3.34* 3.13* 2.93* 3.06* 2.82*  CALCIUM 8.8*  < > 8.4* 8.3* 8.1* 8.2* 8.3*  MG 1.9  --   --   --   --   --   --     AST 19  --   --   --   --   --   --   ALT 9*  --   --   --   --   --   --   ALKPHOS 52  --   --   --   --   --   --   BILITOT 0.5  --   --   --   --   --   --   < > =  values in this interval not displayed. ------------------------------------------------------------------------------------------------------------------ No results for input(s): CHOL, HDL, LDLCALC, TRIG, CHOLHDL, LDLDIRECT in the last 72 hours.  Lab Results  Component Value Date   HGBA1C 4.9 02/20/2016   ------------------------------------------------------------------------------------------------------------------ No results for input(s): TSH, T4TOTAL, T3FREE, THYROIDAB in the last 72 hours.  Invalid input(s): FREET3 ------------------------------------------------------------------------------------------------------------------ No results for input(s): VITAMINB12, FOLATE, FERRITIN, TIBC, IRON, RETICCTPCT in the last 72 hours.  Coagulation profile  Recent Labs Lab 06/26/16 0507 06/27/16 0543 06/28/16 0401 06/29/16 0455 06/30/16 0520  INR 3.71 3.92 3.93 4.17* 3.40    No results for input(s): DDIMER in the last 72 hours.  Cardiac Enzymes No results for input(s): CKMB, TROPONINI, MYOGLOBIN in the last 168 hours.  Invalid input(s): CK ------------------------------------------------------------------------------------------------------------------    Component Value Date/Time   BNP 1,430.8 (H) 06/25/2016 0340   BNP 891.5 (H) 06/18/2016 1627    Inpatient Medications  Scheduled Meds: . amiodarone  100 mg Oral Daily  . atorvastatin  80 mg Oral QHS  . carvedilol  25 mg Oral BID WC  . hydrALAZINE  100 mg Oral Q8H  . isosorbide mononitrate  120 mg Oral Daily  . levothyroxine  50 mcg Oral QAC breakfast  . piperacillin-tazobactam (ZOSYN)  IV  3.375 g Intravenous Q8H  . potassium chloride SA  20 mEq Oral BID  . sodium chloride flush  3 mL Intravenous Q12H  . tamsulosin  0.4 mg Oral QPC supper  .  vancomycin  500 mg Oral Q6H  . Warfarin - Pharmacist Dosing Inpatient   Does not apply q1800   Continuous Infusions: PRN Meds:.sodium chloride, acetaminophen, HYDROcodone-acetaminophen, HYDROmorphone (DILAUDID) injection, ondansetron (ZOFRAN) IV, sodium chloride flush  Micro Results Recent Results (from the past 240 hour(s))  MRSA PCR Screening     Status: None   Collection Time: 06/24/16 10:41 PM  Result Value Ref Range Status   MRSA by PCR NEGATIVE NEGATIVE Final    Comment:        The GeneXpert MRSA Assay (FDA approved for NASAL specimens only), is one component of a comprehensive MRSA colonization surveillance program. It is not intended to diagnose MRSA infection nor to guide or monitor treatment for MRSA infections.   Culture, Urine     Status: Abnormal   Collection Time: 06/25/16  2:53 AM  Result Value Ref Range Status   Specimen Description URINE, RANDOM  Final   Special Requests NONE  Final   Culture >=100,000 COLONIES/mL PSEUDOMONAS AERUGINOSA (A)  Final   Report Status 06/27/2016 FINAL  Final   Organism ID, Bacteria PSEUDOMONAS AERUGINOSA (A)  Final      Susceptibility   Pseudomonas aeruginosa - MIC*    CEFTAZIDIME 4 SENSITIVE Sensitive     CIPROFLOXACIN <=0.25 SENSITIVE Sensitive     GENTAMICIN <=1 SENSITIVE Sensitive     IMIPENEM 2 SENSITIVE Sensitive     PIP/TAZO <=4 SENSITIVE Sensitive     CEFEPIME 4 SENSITIVE Sensitive     * >=100,000 COLONIES/mL PSEUDOMONAS AERUGINOSA  C difficile quick scan w PCR reflex     Status: Abnormal   Collection Time: 06/27/16 12:56 PM  Result Value Ref Range Status   C Diff antigen POSITIVE (A) NEGATIVE Final   C Diff toxin POSITIVE (A) NEGATIVE Final   C Diff interpretation Toxin producing C. difficile detected.  Final    Comment: CRITICAL RESULT CALLED TO, READ BACK BY AND VERIFIED WITH: S.GAGLIANO RN AT [id number] ON 06/27/16 BY S.VANHOORNE   Gastrointestinal Panel by PCR , Stool  Status: None   Collection Time: 06/27/16  12:56 PM  Result Value Ref Range Status   Campylobacter species NOT DETECTED NOT DETECTED Final   Plesimonas shigelloides NOT DETECTED NOT DETECTED Final   Salmonella species NOT DETECTED NOT DETECTED Final   Yersinia enterocolitica NOT DETECTED NOT DETECTED Final   Vibrio species NOT DETECTED NOT DETECTED Final   Vibrio cholerae NOT DETECTED NOT DETECTED Final   Enteroaggregative E coli (EAEC) NOT DETECTED NOT DETECTED Final   Enteropathogenic E coli (EPEC) NOT DETECTED NOT DETECTED Final   Enterotoxigenic E coli (ETEC) NOT DETECTED NOT DETECTED Final   Shiga like toxin producing E coli (STEC) NOT DETECTED NOT DETECTED Final   Shigella/Enteroinvasive E coli (EIEC) NOT DETECTED NOT DETECTED Final   Cryptosporidium NOT DETECTED NOT DETECTED Final   Cyclospora cayetanensis NOT DETECTED NOT DETECTED Final   Entamoeba histolytica NOT DETECTED NOT DETECTED Final   Giardia lamblia NOT DETECTED NOT DETECTED Final   Adenovirus F40/41 NOT DETECTED NOT DETECTED Final   Astrovirus NOT DETECTED NOT DETECTED Final   Norovirus GI/GII NOT DETECTED NOT DETECTED Final   Rotavirus A NOT DETECTED NOT DETECTED Final   Sapovirus (I, II, IV, and V) NOT DETECTED NOT DETECTED Final    Radiology Reports Ct Abdomen Pelvis Wo Contrast  Result Date: 06/26/2016 CLINICAL DATA:  Right back pain for the past 7 days. Urinary tract infection. Moderate right hydronephrosis on a renal ultrasound earlier today. EXAM: CT ABDOMEN AND PELVIS WITHOUT CONTRAST TECHNIQUE: Multidetector CT imaging of the abdomen and pelvis was performed following the standard protocol without IV contrast. COMPARISON:  Renal ultrasound earlier today. Abdomen and pelvis CT dated 04/08/2016. FINDINGS: Lower chest: Small bilateral pleural effusions. Mild bilateral lower lobe atelectasis. Hepatobiliary: Liver cysts.  Cholecystectomy clips. Pancreas: Unremarkable. No pancreatic ductal dilatation or surrounding inflammatory changes. Spleen: Normal in size  without focal abnormality. Adrenals/Urinary Tract: Moderate dilatation of the right renal collecting system. Tiny upper pole right renal calculus or vascular calcification. No bladder or ureteral calculi a seen. Unremarkable left kidney. Grossly normal adrenal glands. Multiple bladder diverticula. Stomach/Bowel: Moderate diffuse low density wall thickening involving the colon, including the rectum. Grossly unremarkable stomach and small bowel. Vascular/Lymphatic: Extensive, dense, diffuse arterial calcifications, including the coronary arteries and the abdominal aorta and its branches. No enlarged lymph nodes. Reproductive: Normal sized prostate containing coarse calcifications. Other: The examination is limited by streak artifacts produced by an electronic device on the left side of the patient and limited by a diffuse edema of the subcutaneous, intraperitoneal and retroperitoneal fat. Musculoskeletal: Mild lower lumbar spine degenerative changes. IMPRESSION: 1. Interval diffuse colitis and proctitis. 2. Interval moderate right hydronephrosis with no visible cause. 3. Severe diffuse atheromatous arterial calcifications, including coronary artery atherosclerosis and aortic atherosclerosis. 4. Progressive diffuse anasarca. 5. Small bilateral pleural effusions. 6. Mild bilateral lower lobe atelectasis. 7. Multiple bladder diverticula. Electronically Signed   By: Claudie Revering M.D.   On: 06/26/2016 18:48   Dg Chest 2 View  Result Date: 06/24/2016 CLINICAL DATA:  Weakness, recent endocarditis EXAM: CHEST  2 VIEW COMPARISON:  05/25/2016 FINDINGS: Cardiomediastinal silhouette is stable. Status post CABG. Atherosclerotic calcifications of thoracic aorta. Bilateral small pleural effusion. Streaky bilateral basilar atelectasis or infiltrate right greater than left. No pulmonary edema. IMPRESSION: Status post CABG. Atherosclerotic calcifications of thoracic aorta. Bilateral small pleural effusion. Streaky bilateral  basilar atelectasis or infiltrate right greater than left. No pulmonary edema. Electronically Signed   By: Lahoma Crocker M.D.   On: 06/24/2016  15:05   Dg Ribs Unilateral Right  Result Date: 06/24/2016 CLINICAL DATA:  80 year old male with fall and a right posterior rib pain. EXAM: RIGHT RIBS - 2 VIEW COMPARISON:  Chest radiograph dated 06/24/2016 and chest radiograph dated 04/08/2016 FINDINGS: There is an age indeterminate nondisplaced fracture of the posterolateral aspect of the right ninth rib. The bones are osteopenic which limits evaluation for fracture. No other fracture identified. There is a small right pleural effusion. Right lung base opacity may represent atelectatic changes versus infiltrate. Trace fluid noted in the minor fissure. There is cardiomegaly. Median sternotomy wires and CABG surgical clips noted. There is atherosclerotic calcification of the aorta. There is degenerative changes of the spine. Right upper quadrant cholecystectomy clips noted. IMPRESSION: Nondisplaced fracture of the posterior aspect of the right ninth rib, age indeterminate. No pneumothorax. Small right pleural effusion with right lung base atelectasis versus infiltrate. Electronically Signed   By: Anner Crete M.D.   On: 06/24/2016 23:28   Mr Brain Wo Contrast  Result Date: 06/24/2016 CLINICAL DATA:  LEFT lower extremity weakness. History of prostate cancer, diabetes. EXAM: MRI HEAD WITHOUT CONTRAST TECHNIQUE: Multiplanar, multiecho pulse sequences of the brain and surrounding structures were obtained without intravenous contrast. COMPARISON:  CT HEAD February 20, 2016 FINDINGS: BRAIN: No reduced diffusion to suggest acute ischemia or hypercellular tumor. Punctate chronic microhemorrhage LEFT parietal lobe. The ventricles and sulci are normal for patient's age. Patchy to confluent supratentorial white matter FLAIR T2 hyperintensities. No suspicious parenchymal signal, masses or mass effect. Multiple small cortical  infarcts. Prominent symmetric perivascular spaces, normal variant. No abnormal extra-axial fluid collections. No extra-axial masses though, contrast enhanced sequences would be more sensitive. VASCULAR: Normal major intracranial vascular flow voids present at skull base. SKULL AND UPPER CERVICAL SPINE: No abnormal sellar expansion. No suspicious calvarial bone marrow signal. Craniocervical junction maintained. Trace atlantodental fusion. SINUSES/ORBITS: Lobulated LEFT maxillary sinus mucosal thickening on, trace remaining paranasal sinus mucosal thickening. Trace mastoid effusions. The included ocular globes and orbital contents are non-suspicious. OTHER: None. IMPRESSION: No acute intracranial process. Involutional changes. Moderate chronic small vessel ischemic disease. Electronically Signed   By: Elon Alas M.D.   On: 06/24/2016 21:16   Mr Lumbar Spine Wo Contrast  Result Date: 06/25/2016 CLINICAL DATA:  80 year old male with generalized weakness for 3 days. Lumbar back pain after a fall. Leg weakness. Initial encounter. EXAM: MRI LUMBAR SPINE WITHOUT CONTRAST TECHNIQUE: Multiplanar, multisequence MR imaging of the lumbar spine was performed. No intravenous contrast was administered. COMPARISON:  CT Abdomen and Pelvis 04/08/2016 FINDINGS: MRI imaging quality of the lumbar spine is significantly degraded by generalize loss of signal despite repeated imaging attempts. The etiology of the signal loss seems to be generalized soft tissue edema such as anasarca. Segmentation: Normal as seen on the comparison CT Abdomen and Pelvis. Alignment: Stable since the prior CT. Mild grade 1 anterolisthesis of L5 on S1. Mild dextroconvex lumbar scoliosis. Vertebrae: Suboptimal characterization of marrow due to the decreased signal to noise. No definite marrow edema or acute osseous abnormality. Conus medullaris: Extends to the L1 level and appears normal. Paraspinal and other soft tissues: Diffuse soft tissue edema  suspected such is anasarca. This also seems to affect the visible paraspinal muscles bilaterally. Very limited visualization of abdominal viscera. The visualized kidneys appear stable to the prior CT. Disc levels: No significant lumbar spinal stenosis. Mild lumbar spine degeneration above L4. At L4-L5 there is disc bulging and moderate to severe facet and ligament flavum hypertrophy. Both facet  joints are capacious and contain fluid. Small posteriorly situated synovial cyst on the left. No significant stenosis. Chronic mild anterolisthesis at L5-S1 with circumferential disc bulge and moderate to severe facet hypertrophy. No spinal stenosis. Up to mild lateral recess stenosis and bilateral L5 foraminal stenosis. IMPRESSION: 1. MRI of the lumbar spine significantly degraded by signal loss despite repeated imaging attempts, which seems to be due to generalized body soft tissue edema such as due to anasarca. 2. Given #1, diffuse lumbar Myositis is difficult to exclude. No acute osseous abnormality identified. 3. No significant lumbar spinal stenosis. Spinal degeneration primarily only at L4-L5 and L5-S1 where there is facet arthropathy in part related to mild spondylolisthesis. Subsequent up to mild multifactorial lateral recess and foraminal stenosis. Electronically Signed   By: Genevie Ann M.D.   On: 06/25/2016 10:29   US Renal  Result Date: 06/30/2016 CLINICAL DATA:  Hydronephrosis EXAM: RENAL / URINARY TRACT ULTRASOUND COMPLETE COMPARISON:  06/26/2016 FINDINGS: Right Kidney: Length: 11.7 cm. Diffusely increased parenchymal echogenicity is stable. Cyst measuring 1.4 cm is stable. Hydronephrosis has resolved. Left Kidney: Length: 10.0 cm. Increased echogenicity throughout the parenchyma. No hydronephrosis or mass. Bladder: Foley catheter decompresses the bladder. Additional findings: There is a small amount of ascites about the liver. IMPRESSION: Right hydronephrosis has resolved. Foley catheter decompresses the  bladder. Small amount of ascites. Electronically Signed   By: Marybelle Killings M.D.   On: 06/30/2016 09:44   US Renal  Result Date: 06/26/2016 CLINICAL DATA:  Acute kidney failure EXAM: RENAL / URINARY TRACT ULTRASOUND COMPLETE COMPARISON:  None. FINDINGS: Right Kidney: Length: 10.6 cm. 1.6 cm cyst in the upper pole. Moderate hydronephrosis. Diffusely increased echotexture throughout the right kidney. Left Kidney: Length: 10.1 cm. Increased echotexture throughout the left kidney. No hydronephrosis or mass. Bladder: Layering debris dependently in the bladder. Mild bladder wall irregularity and thickening. IMPRESSION: Moderate right hydronephrosis. Increased echotexture within the kidneys bilaterally compatible with chronic medical renal disease. Irregular wall thickening throughout the bladder. While this could be related to bladder outlet obstruction, I cannot exclude infection or neoplasm. Recommend clinical correlation. Electronically Signed   By: Rolm Baptise M.D.   On: 06/26/2016 10:50    Time Spent in minutes  35   Velvet Bathe M.D on 06/30/2016 at 12:46 PM  Between 7am to 7pm - Pager - [phone]  After 7pm go to [email or web address] - password [facility]  [facility] -  Office  [phone]

## 2016-07-01 LAB — PROTIME-INR
INR: 3.32
PROTHROMBIN TIME: 34.5 s — AB (ref 11.4–15.2)

## 2016-07-01 MED ORDER — AMLODIPINE BESYLATE 5 MG PO TABS: 5.0000 mg | ORAL_TABLET | Freq: Every day | ORAL | 0 refills | Status: AC

## 2016-07-01 MED ORDER — WARFARIN SODIUM 6 MG PO TABS: 6.0000 mg | ORAL_TABLET | Freq: Every evening | ORAL | Status: DC

## 2016-07-01 MED ORDER — VANCOMYCIN 50 MG/ML ORAL SOLUTION: 500.0000 mg | Freq: Four times a day (QID) | ORAL | 0 refills | Status: AC

## 2016-07-01 NOTE — Progress Notes (Signed)
[place] for Warfarin Indication: Atrial fibrillation, hx of DVT  Allergies  Allergen Reactions  . Nsaids Nausea Only    Reaction:  GI upset   . Ibuprofen Other (See Comments)    Reaction:  GI upset   . Ace Inhibitors Cough   Patient Measurements: Height: 5\' 10"  (177.8 cm) Weight: 176 lb (79.8 kg) IBW/kg (Calculated) : 73  Vital Signs: Temp: 98.6 F (37 C) (12/21 0633) Temp Source: Oral (12/21 [id number]) BP: 179/71 (12/21 [id number]) Pulse Rate: 76 (12/21 0633)  Labs:  Recent Labs  06/29/16 0455 06/30/16 0520 07/01/16 0507  HGB 8.4* 9.1*  --   HCT 25.6* 27.5*  --   PLT 191 212  --   LABPROT 41.4* 35.2* 34.5*  INR 4.17* 3.40 3.32  CREATININE 3.06* 2.82*  --    Estimated Creatinine Clearance: 22.3 mL/min (by C-G formula based on SCr of 2.82 mg/dL (H)).  Medications:  Scheduled:  . amiodarone  100 mg Oral Daily  . atorvastatin  80 mg Oral QHS  . carvedilol  25 mg Oral BID WC  . hydrALAZINE  100 mg Oral Q8H  . isosorbide mononitrate  120 mg Oral Daily  . levothyroxine  50 mcg Oral QAC breakfast  . piperacillin-tazobactam (ZOSYN)  IV  3.375 g Intravenous Q8H  . potassium chloride SA  20 mEq Oral BID  . sodium chloride flush  3 mL Intravenous Q12H  . tamsulosin  0.4 mg Oral QPC supper  . vancomycin  500 mg Oral Q6H  . Warfarin - Pharmacist Dosing Inpatient   Does not apply q1800   Assessment: 18 yoM to ED 12/14 with weakness, fall at home 3 days PTA. Admit INR 2.45 - Patient stated home regimen is 12mg  daily - Coag clinic visit 12/12 with INR 3.7, hold Warf 12/13 then resume 6mg  daily   Today, 07/01/2016  INR still supratherapeutic but trending down after doses of 6mg  and 4mg  as inpatient and no doses since 12/15  CBC: Hgb low but stable/improving. Plts WNL  Drug interactions: no new significant interactions (amiodarone is home med and regimen already adjusted for this)  Note patient stated home dose 12mg  daily vs coumadin  clinic notes to be taking 6mg  daily (as on 12/12)  Goal of Therapy:  INR 2-3   Plan:   Continue to hold warfarin today  Daily INR  Netta Cedars, PharmD, BCPS Pager: [phone] 07/01/2016, 8:01 AM

## 2016-07-01 NOTE — Clinical Social Work Placement (Signed)
Patient is set to discharge to [facility] today. Patient & wife at bedside, [name] made aware. Discharge packet given to RN, Robert Bellow. [facility] called for transport to pick up at 3:00pm.     [name], [place] Worker cell #: [phone]    CLINICAL SOCIAL WORK PLACEMENT  NOTE  Date:  07/01/2016  Patient Details  Name: [name] Sr. MRN: [id number] Date of Birth: January 28, 1936  Clinical Social Work is seeking post-discharge placement for this patient at the [place] level of care (*CSW will initial, date and re-position this form in  chart as items are completed):  Yes   Patient/family provided with [place] Work Department's list of facilities offering this level of care within the geographic area requested by the patient (or if unable, by the patient's family).  Yes   Patient/family informed of their freedom to choose among providers that offer the needed level of care, that participate in Medicare, Medicaid or managed care program needed by the patient, have an available bed and are willing to accept the patient.  Yes   Patient/family informed of [facility]'s ownership interest in [facility] and [facility], as well as of the fact that they are under no obligation to receive care at these facilities.  PASRR submitted to EDS on       PASRR number received on       Existing PASRR number confirmed on 06/28/16     FL2 transmitted to all facilities in geographic area requested by pt/family on 06/28/16     FL2 transmitted to all facilities within larger geographic area on       Patient informed that his/her managed care company has contracts with or will negotiate with certain facilities, including the following:        Yes   Patient/family informed of bed offers received.  Patient chooses bed at [facility]     Physician recommends and patient chooses bed at       Patient to be transferred to [facility] on 07/01/16.  Patient to be transferred to facility by [facility]     Patient family notified on 07/01/16 of transfer.  Name of family member notified:  patient's wife, [name] at bedside     PHYSICIAN       Additional Comment:    _______________________________________________ Standley Brooking, LCSW 07/01/2016, 1:56 PM

## 2016-07-01 NOTE — Discharge Summary (Signed)
Physician Discharge Summary  [name] [id number] DOB: [birth year] DOA: 06/24/2016  PCP: [name], MD  Admit date: 06/24/2016 Discharge date: 07/01/2016  Time spent: > 35  minutes  Recommendations for Outpatient Follow-up:  1. Monitor K levels and replete as necessary  2. Set up appointment with urologist after discharge for evaluation of voiding trial 3. Monitor blood pressures and adjust antihypertensive medication regimen   Discharge Diagnoses:  Principal Problem:   Hypertensive crisis Active Problems:   PAF (paroxysmal atrial fibrillation) (HCC)   CKD (chronic kidney disease), stage IV (HCC)   Anemia of chronic disease   MDS (myelodysplastic syndrome) (HCC)   Pressure ulcer   Acute back pain   Acute on chronic diastolic CHF (congestive heart failure) (HCC)   Debility   Leg weakness, bilateral   UTI (urinary tract infection)   Fall   Warfarin anticoagulation   Hypertensive urgency   Discharge Condition: stable  Diet recommendation: Heart healthy  Filed Weights   06/29/16 0508 06/30/16 0640 07/01/16 0633  Weight: 81.6 kg (180 lb) 85.7 kg (189 lb) 79.8 kg (176 lb)    History of present illness:  80 year old male with multiple comorbidities including CAD with history of CABG, PCI, PAF/sick sinus syndrome status post PPM (was removed recently due to systemic infection), taken off anticoagulation due to bleeding complications, chronic diastolic CHF, hypertension, diabetes mellitus, CAD stage IV, myelodysplastic syndrome and hypothyroidism was brought to the ED by his wife and daughter with generalized weakness and fall at home 3 days prior to admission. 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 difficile colitis and pseudomonal UTI. He was discharged on Coumadin for DVT. Patient fell at home 3 days prior to admission stating that his legs gave away.. Denies any bowel  or urinary symptoms. Denies chest pain, palpitations, but has increased dyspnea on exertion. Reports good appetite. Family have been looking for SNF options  Hospital Course:  Hypertensive crisis Better with pain control. Resumed home blood pressure medications (Coreg, amlodipine, hydralazine, Imdur).will be discharged on medication regimen listed below - Added amlodipine on d/c to patient's home medication regimen.  Active Problems: Acute C. difficile colitis Stool for C. difficile positive. Patient was on prolonged antibiotics until recently.  continue oral vancomycin. Treat for 2 weeks starting discharge day.    Acute on CKD (chronic kidney disease), stage IV (HCC) Worsened renal fn with moderate right-sided hydronephrosis seen on Imaging. Hx of hemorrhagic cystitis with bladder clot, previously requiring CBI and foley for several months. - urology consult appreciated. Foley placed in with good urine output. Recommend to leave Foley catheter for now have patient follow-up with urology. U/S -Renal function improving. Resolved right hydronephrosis  Fall with leg weakness and low back pain. MRI negative for stroke. MRI of the lumbar spine without acute injury. ? Myositis. PT evaluation. SNF on d/c  Recurrent Pseudomonas UTI Cefepime switched to Zosyn to cover intra-abdominal infection. Pt had 7 days on day of discharge.  Acute on chronic diastolic CHF Continue prior to admission home medication regimen     PAF (paroxysmal atrial fibrillation) ([place]) Rate controlled. Continue beta blocker and amiodarone. On warfarin for upper extremity DVT. Stool for Hemoccult positive. hgb up from last check.    Anemia of chronic disease Stable. For Hemoccult positive. Monitor while on anticoagulant is seen. Last hgb up from last check.  Low back pain and lower extremity weakness Pain control with when necessary  Vicodin and low-dose Dilaudid. MRI of the back negative for acute injury. PT  recommends SNF.   BPH Continue Flomax.  Recent upper extremity DVT Continue warfarin at prior to admission dose   CAD with history of CABG (94 and 2013) Follows with Dr. Tamala Julian. Continue beta blocker.  History of PAF/sick sinus syndrome Pacemaker removed recently due to infection.  - Currently on Coumadin  Hypothyroidism Continue Synthroid.  Procedures:  None  Consultations:  None  Discharge Exam: Vitals:   07/01/16 0633 07/01/16 1151  BP: (!) 179/71 (!) 161/81  Pulse: 76 71  Resp: 18   Temp: 98.6 F (37 C)     General: Pt in nad, alert and awake Cardiovascular: irrr, no rubs Respiratory: no increased wob, no wheeze4s  Discharge Instructions   Discharge Instructions    Call MD for:  difficulty breathing, headache or visual disturbances    Complete by:  As directed    Call MD for:  extreme fatigue    Complete by:  As directed    Call MD for:  redness, tenderness, or signs of infection (pain, swelling, redness, odor or green/yellow discharge around incision site)    Complete by:  As directed    Call MD for:  temperature >100.4    Complete by:  As directed    Diet - low sodium heart healthy    Complete by:  As directed    Discharge instructions    Complete by:  As directed    Please have your primary care physician decide when to continue your lasix. It will be held on discharge given your recent diarrhea.   Increase activity slowly    Complete by:  As directed      Current Discharge Medication List    START taking these medications   Details  amLODipine (NORVASC) 5 MG tablet Take 1 tablet (5 mg total) by mouth daily. Qty: 30 tablet, Refills: 0    vancomycin (VANCOCIN) 50 mg/mL oral solution Take 10 mLs (500 mg total) by mouth every 6 (six) hours. Qty: 560 mL, Refills: 0      CONTINUE these medications which have CHANGED   Details  warfarin (COUMADIN) 6 MG tablet Take 1 tablet (6 mg total) by mouth every evening.      CONTINUE these  medications which have NOT CHANGED   Details  acetaminophen (TYLENOL) 650 MG CR tablet Take 650 mg by mouth 2 (two) times daily.    amiodarone (PACERONE) 200 MG tablet Take 100 mg by mouth daily.    atorvastatin (LIPITOR) 80 MG tablet Take 80 mg by mouth at bedtime.     calcium carbonate (OSCAL) 1500 (600 Ca) MG TABS tablet Take 600 mg of elemental calcium by mouth daily with breakfast.    carvedilol (COREG) 25 MG tablet Take 1 tablet (25 mg total) by mouth 2 (two) times daily with a meal. Qty: 60 tablet, Refills: 0    cyanocobalamin (,VITAMIN B-12,) 1000 MCG/ML injection Inject 1,000 mcg into the muscle every 30 (thirty) days.     epoetin alfa (EPOGEN,PROCRIT) 16109 UNIT/ML injection Inject 10,000 Units into the skin every 14 (fourteen) days.     ferrous sulfate 325 (65 FE) MG tablet Take 1 tablet (325 mg total) by mouth 3 (three) times daily with meals. Refills: 3    hydrALAZINE (APRESOLINE) 25 MG tablet Take 3 tablets (75 mg total) by mouth 4 (four) times daily.    isosorbide mononitrate (IMDUR) 120 MG 24 hr tablet Take 120 mg by  mouth daily.    leuprolide (LUPRON) 30 MG injection Inject 30 mg into the muscle every 4 (four) months.    levothyroxine (SYNTHROID) 50 MCG tablet Take 1 tablet (50 mcg total) by mouth daily before breakfast. Qty: 90 tablet, Refills: 3    Multiple Vitamin (MULTIVITAMIN WITH MINERALS) TABS tablet Take 1 tablet by mouth daily.     Nutritional Supplements (FEEDING SUPPLEMENT, GLUCERNA 1.2 CAL,) LIQD Take 237 mLs by mouth 2 (two) times daily as needed (for nutritional supplement).     nystatin (NYSTATIN) powder Apply 1 g topically 2 (two) times daily.    silodosin (RAPAFLO) 8 MG CAPS capsule Take 8 mg by mouth at bedtime.       STOP taking these medications     cloNIDine (CATAPRES) 0.1 MG tablet      furosemide (LASIX) 80 MG tablet      potassium chloride SA (K-DUR,KLOR-CON) 20 MEQ tablet      senna-docusate (SENOKOT-S) 8.6-50 MG tablet         Allergies  Allergen Reactions  . Nsaids Nausea Only    Reaction:  GI upset   . Ibuprofen Other (See Comments)    Reaction:  GI upset   . Ace Inhibitors Cough      The results of significant diagnostics from this hospitalization (including imaging, microbiology, ancillary and laboratory) are listed below for reference.    Significant Diagnostic Studies: Ct Abdomen Pelvis Wo Contrast  Result Date: 06/26/2016 CLINICAL DATA:  Right back pain for the past 7 days. Urinary tract infection. Moderate right hydronephrosis on a renal ultrasound earlier today. EXAM: CT ABDOMEN AND PELVIS WITHOUT CONTRAST TECHNIQUE: Multidetector CT imaging of the abdomen and pelvis was performed following the standard protocol without IV contrast. COMPARISON:  Renal ultrasound earlier today. Abdomen and pelvis CT dated 04/08/2016. FINDINGS: Lower chest: Small bilateral pleural effusions. Mild bilateral lower lobe atelectasis. Hepatobiliary: Liver cysts.  Cholecystectomy clips. Pancreas: Unremarkable. No pancreatic ductal dilatation or surrounding inflammatory changes. Spleen: Normal in size without focal abnormality. Adrenals/Urinary Tract: Moderate dilatation of the right renal collecting system. Tiny upper pole right renal calculus or vascular calcification. No bladder or ureteral calculi a seen. Unremarkable left kidney. Grossly normal adrenal glands. Multiple bladder diverticula. Stomach/Bowel: Moderate diffuse low density wall thickening involving the colon, including the rectum. Grossly unremarkable stomach and small bowel. Vascular/Lymphatic: Extensive, dense, diffuse arterial calcifications, including the coronary arteries and the abdominal aorta and its branches. No enlarged lymph nodes. Reproductive: Normal sized prostate containing coarse calcifications. Other: The examination is limited by streak artifacts produced by an electronic device on the left side of the patient and limited by a diffuse edema of the  subcutaneous, intraperitoneal and retroperitoneal fat. Musculoskeletal: Mild lower lumbar spine degenerative changes. IMPRESSION: 1. Interval diffuse colitis and proctitis. 2. Interval moderate right hydronephrosis with no visible cause. 3. Severe diffuse atheromatous arterial calcifications, including coronary artery atherosclerosis and aortic atherosclerosis. 4. Progressive diffuse anasarca. 5. Small bilateral pleural effusions. 6. Mild bilateral lower lobe atelectasis. 7. Multiple bladder diverticula. Electronically Signed   By: Claudie Revering M.D.   On: 06/26/2016 18:48   Dg Chest 2 View  Result Date: 06/24/2016 CLINICAL DATA:  Weakness, recent endocarditis EXAM: CHEST  2 VIEW COMPARISON:  05/25/2016 FINDINGS: Cardiomediastinal silhouette is stable. Status post CABG. Atherosclerotic calcifications of thoracic aorta. Bilateral small pleural effusion. Streaky bilateral basilar atelectasis or infiltrate right greater than left. No pulmonary edema. IMPRESSION: Status post CABG. Atherosclerotic calcifications of thoracic aorta. Bilateral small pleural effusion. Streaky  bilateral basilar atelectasis or infiltrate right greater than left. No pulmonary edema. Electronically Signed   By: Lahoma Crocker M.D.   On: 06/24/2016 15:05   Dg Ribs Unilateral Right  Result Date: 06/24/2016 CLINICAL DATA:  80 year old male with fall and a right posterior rib pain. EXAM: RIGHT RIBS - 2 VIEW COMPARISON:  Chest radiograph dated 06/24/2016 and chest radiograph dated 04/08/2016 FINDINGS: There is an age indeterminate nondisplaced fracture of the posterolateral aspect of the right ninth rib. The bones are osteopenic which limits evaluation for fracture. No other fracture identified. There is a small right pleural effusion. Right lung base opacity may represent atelectatic changes versus infiltrate. Trace fluid noted in the minor fissure. There is cardiomegaly. Median sternotomy wires and CABG surgical clips noted. There is  atherosclerotic calcification of the aorta. There is degenerative changes of the spine. Right upper quadrant cholecystectomy clips noted. IMPRESSION: Nondisplaced fracture of the posterior aspect of the right ninth rib, age indeterminate. No pneumothorax. Small right pleural effusion with right lung base atelectasis versus infiltrate. Electronically Signed   By: Anner Crete M.D.   On: 06/24/2016 23:28   Mr Brain Wo Contrast  Result Date: 06/24/2016 CLINICAL DATA:  LEFT lower extremity weakness. History of prostate cancer, diabetes. EXAM: MRI HEAD WITHOUT CONTRAST TECHNIQUE: Multiplanar, multiecho pulse sequences of the brain and surrounding structures were obtained without intravenous contrast. COMPARISON:  CT HEAD February 20, 2016 FINDINGS: BRAIN: No reduced diffusion to suggest acute ischemia or hypercellular tumor. Punctate chronic microhemorrhage LEFT parietal lobe. The ventricles and sulci are normal for patient's age. Patchy to confluent supratentorial white matter FLAIR T2 hyperintensities. No suspicious parenchymal signal, masses or mass effect. Multiple small cortical infarcts. Prominent symmetric perivascular spaces, normal variant. No abnormal extra-axial fluid collections. No extra-axial masses though, contrast enhanced sequences would be more sensitive. VASCULAR: Normal major intracranial vascular flow voids present at skull base. SKULL AND UPPER CERVICAL SPINE: No abnormal sellar expansion. No suspicious calvarial bone marrow signal. Craniocervical junction maintained. Trace atlantodental fusion. SINUSES/ORBITS: Lobulated LEFT maxillary sinus mucosal thickening on, trace remaining paranasal sinus mucosal thickening. Trace mastoid effusions. The included ocular globes and orbital contents are non-suspicious. OTHER: None. IMPRESSION: No acute intracranial process. Involutional changes. Moderate chronic small vessel ischemic disease. Electronically Signed   By: Elon Alas M.D.   On:  06/24/2016 21:16   Mr Lumbar Spine Wo Contrast  Result Date: 06/25/2016 CLINICAL DATA:  80 year old male with generalized weakness for 3 days. Lumbar back pain after a fall. Leg weakness. Initial encounter. EXAM: MRI LUMBAR SPINE WITHOUT CONTRAST TECHNIQUE: Multiplanar, multisequence MR imaging of the lumbar spine was performed. No intravenous contrast was administered. COMPARISON:  CT Abdomen and Pelvis 04/08/2016 FINDINGS: MRI imaging quality of the lumbar spine is significantly degraded by generalize loss of signal despite repeated imaging attempts. The etiology of the signal loss seems to be generalized soft tissue edema such as anasarca. Segmentation: Normal as seen on the comparison CT Abdomen and Pelvis. Alignment: Stable since the prior CT. Mild grade 1 anterolisthesis of L5 on S1. Mild dextroconvex lumbar scoliosis. Vertebrae: Suboptimal characterization of marrow due to the decreased signal to noise. No definite marrow edema or acute osseous abnormality. Conus medullaris: Extends to the L1 level and appears normal. Paraspinal and other soft tissues: Diffuse soft tissue edema suspected such is anasarca. This also seems to affect the visible paraspinal muscles bilaterally. Very limited visualization of abdominal viscera. The visualized kidneys appear stable to the prior CT. Disc levels: No significant lumbar  spinal stenosis. Mild lumbar spine degeneration above L4. At L4-L5 there is disc bulging and moderate to severe facet and ligament flavum hypertrophy. Both facet joints are capacious and contain fluid. Small posteriorly situated synovial cyst on the left. No significant stenosis. Chronic mild anterolisthesis at L5-S1 with circumferential disc bulge and moderate to severe facet hypertrophy. No spinal stenosis. Up to mild lateral recess stenosis and bilateral L5 foraminal stenosis. IMPRESSION: 1. MRI of the lumbar spine significantly degraded by signal loss despite repeated imaging attempts, which  seems to be due to generalized body soft tissue edema such as due to anasarca. 2. Given #1, diffuse lumbar Myositis is difficult to exclude. No acute osseous abnormality identified. 3. No significant lumbar spinal stenosis. Spinal degeneration primarily only at L4-L5 and L5-S1 where there is facet arthropathy in part related to mild spondylolisthesis. Subsequent up to mild multifactorial lateral recess and foraminal stenosis. Electronically Signed   By: Genevie Ann M.D.   On: 06/25/2016 10:29   US Renal  Result Date: 06/30/2016 CLINICAL DATA:  Hydronephrosis EXAM: RENAL / URINARY TRACT ULTRASOUND COMPLETE COMPARISON:  06/26/2016 FINDINGS: Right Kidney: Length: 11.7 cm. Diffusely increased parenchymal echogenicity is stable. Cyst measuring 1.4 cm is stable. Hydronephrosis has resolved. Left Kidney: Length: 10.0 cm. Increased echogenicity throughout the parenchyma. No hydronephrosis or mass. Bladder: Foley catheter decompresses the bladder. Additional findings: There is a small amount of ascites about the liver. IMPRESSION: Right hydronephrosis has resolved. Foley catheter decompresses the bladder. Small amount of ascites. Electronically Signed   By: Marybelle Killings M.D.   On: 06/30/2016 09:44   US Renal  Result Date: 06/26/2016 CLINICAL DATA:  Acute kidney failure EXAM: RENAL / URINARY TRACT ULTRASOUND COMPLETE COMPARISON:  None. FINDINGS: Right Kidney: Length: 10.6 cm. 1.6 cm cyst in the upper pole. Moderate hydronephrosis. Diffusely increased echotexture throughout the right kidney. Left Kidney: Length: 10.1 cm. Increased echotexture throughout the left kidney. No hydronephrosis or mass. Bladder: Layering debris dependently in the bladder. Mild bladder wall irregularity and thickening. IMPRESSION: Moderate right hydronephrosis. Increased echotexture within the kidneys bilaterally compatible with chronic medical renal disease. Irregular wall thickening throughout the bladder. While this could be related to  bladder outlet obstruction, I cannot exclude infection or neoplasm. Recommend clinical correlation. Electronically Signed   By: Rolm Baptise M.D.   On: 06/26/2016 10:50    Microbiology: Recent Results (from the past 240 hour(s))  MRSA PCR Screening     Status: None   Collection Time: 06/24/16 10:41 PM  Result Value Ref Range Status   MRSA by PCR NEGATIVE NEGATIVE Final    Comment:        The GeneXpert MRSA Assay (FDA approved for NASAL specimens only), is one component of a comprehensive MRSA colonization surveillance program. It is not intended to diagnose MRSA infection nor to guide or monitor treatment for MRSA infections.   Culture, Urine     Status: Abnormal   Collection Time: 06/25/16  2:53 AM  Result Value Ref Range Status   Specimen Description URINE, RANDOM  Final   Special Requests NONE  Final   Culture >=100,000 COLONIES/mL PSEUDOMONAS AERUGINOSA (A)  Final   Report Status 06/27/2016 FINAL  Final   Organism ID, Bacteria PSEUDOMONAS AERUGINOSA (A)  Final      Susceptibility   Pseudomonas aeruginosa - MIC*    CEFTAZIDIME 4 SENSITIVE Sensitive     CIPROFLOXACIN <=0.25 SENSITIVE Sensitive     GENTAMICIN <=1 SENSITIVE Sensitive     IMIPENEM 2 SENSITIVE Sensitive  PIP/TAZO <=4 SENSITIVE Sensitive     CEFEPIME 4 SENSITIVE Sensitive     * >=100,000 COLONIES/mL PSEUDOMONAS AERUGINOSA  C difficile quick scan w PCR reflex     Status: Abnormal   Collection Time: 06/27/16 12:56 PM  Result Value Ref Range Status   C Diff antigen POSITIVE (A) NEGATIVE Final   C Diff toxin POSITIVE (A) NEGATIVE Final   C Diff interpretation Toxin producing C. difficile detected.  Final    Comment: CRITICAL RESULT CALLED TO, READ BACK BY AND VERIFIED WITH: S.GAGLIANO RN AT [id number] ON 06/27/16 BY S.VANHOORNE   Gastrointestinal Panel by PCR , Stool     Status: None   Collection Time: 06/27/16 12:56 PM  Result Value Ref Range Status   Campylobacter species NOT DETECTED NOT DETECTED Final    Plesimonas shigelloides NOT DETECTED NOT DETECTED Final   Salmonella species NOT DETECTED NOT DETECTED Final   Yersinia enterocolitica NOT DETECTED NOT DETECTED Final   Vibrio species NOT DETECTED NOT DETECTED Final   Vibrio cholerae NOT DETECTED NOT DETECTED Final   Enteroaggregative E coli (EAEC) NOT DETECTED NOT DETECTED Final   Enteropathogenic E coli (EPEC) NOT DETECTED NOT DETECTED Final   Enterotoxigenic E coli (ETEC) NOT DETECTED NOT DETECTED Final   Shiga like toxin producing E coli (STEC) NOT DETECTED NOT DETECTED Final   Shigella/Enteroinvasive E coli (EIEC) NOT DETECTED NOT DETECTED Final   Cryptosporidium NOT DETECTED NOT DETECTED Final   Cyclospora cayetanensis NOT DETECTED NOT DETECTED Final   Entamoeba histolytica NOT DETECTED NOT DETECTED Final   Giardia lamblia NOT DETECTED NOT DETECTED Final   Adenovirus F40/41 NOT DETECTED NOT DETECTED Final   Astrovirus NOT DETECTED NOT DETECTED Final   Norovirus GI/GII NOT DETECTED NOT DETECTED Final   Rotavirus A NOT DETECTED NOT DETECTED Final   Sapovirus (I, II, IV, and V) NOT DETECTED NOT DETECTED Final     Labs: Basic Metabolic Panel:  Recent Labs Lab 06/24/16 1556  06/26/16 0507 06/27/16 0543 06/28/16 0401 06/29/16 0455 06/30/16 0520  NA 135  < > 136 134* 135 137 136  K 3.8  < > 4.0 3.3* 3.5 3.6 3.3*  CL 103  < > 106 104 105 106 107  CO2 23  < > 20* 19* 21* 23 21*  GLUCOSE 131*  < > 125* 115* 145* 111* 112*  BUN 53*  < > 56* 58* 57* 56* 53*  CREATININE 2.79*  < > 3.34* 3.13* 2.93* 3.06* 2.82*  CALCIUM 8.8*  < > 8.4* 8.3* 8.1* 8.2* 8.3*  MG 1.9  --   --   --   --   --   --   < > = values in this interval not displayed. Liver Function Tests:  Recent Labs Lab 06/24/16 1556  AST 19  ALT 9*  ALKPHOS 52  BILITOT 0.5  PROT 8.2*  ALBUMIN 2.9*   No results for input(s): LIPASE, AMYLASE in the last 168 hours. No results for input(s): AMMONIA in the last 168 hours. CBC:  Recent Labs Lab 06/24/16 1556   06/26/16 0507 06/27/16 0756 06/28/16 0401 06/29/16 0455 06/30/16 0520  WBC 12.0*  --  20.4* 25.3* 20.2* 17.8* 12.8*  NEUTROABS 9.5*  --   --   --   --   --   --   HGB 10.8*  < > 9.1* 9.7* 8.3* 8.4* 9.1*  HCT 32.4*  < > 27.5* 29.5* 24.7* 25.6* 27.5*  MCV 89.8  --  91.1 89.7 88.5 88.9  88.1  PLT 221  --  196 214 200 191 212  < > = values in this interval not displayed. Cardiac Enzymes: No results for input(s): CKTOTAL, CKMB, CKMBINDEX, TROPONINI in the last 168 hours. BNP: BNP (last 3 results)  Recent Labs  06/18/16 1627 06/24/16 1556 06/25/16 0340  BNP 891.5* 1,575.0* 1,430.8*    ProBNP (last 3 results) No results for input(s): PROBNP in the last 8760 hours.  CBG: No results for input(s): GLUCAP in the last 168 hours.     Signed:  Velvet Bathe MD.  [facility] 07/01/2016, 1:12 PM

## 2016-07-02 ENCOUNTER — Encounter: Payer: Self-pay | Admitting: Internal Medicine

## 2016-07-02 ENCOUNTER — Non-Acute Institutional Stay (SKILLED_NURSING_FACILITY): Payer: Medicare Other | Admitting: Internal Medicine

## 2016-07-02 DIAGNOSIS — A0472 Enterocolitis due to Clostridium difficile, not specified as recurrent: Secondary | ICD-10-CM

## 2016-07-02 DIAGNOSIS — N179 Acute kidney failure, unspecified: Secondary | ICD-10-CM

## 2016-07-02 DIAGNOSIS — N184 Chronic kidney disease, stage 4 (severe): Secondary | ICD-10-CM

## 2016-07-02 DIAGNOSIS — I480 Paroxysmal atrial fibrillation: Secondary | ICD-10-CM

## 2016-07-02 DIAGNOSIS — Z86718 Personal history of other venous thrombosis and embolism: Secondary | ICD-10-CM

## 2016-07-02 DIAGNOSIS — I5033 Acute on chronic diastolic (congestive) heart failure: Secondary | ICD-10-CM | POA: Diagnosis not present

## 2016-07-02 DIAGNOSIS — N390 Urinary tract infection, site not specified: Secondary | ICD-10-CM | POA: Diagnosis not present

## 2016-07-02 DIAGNOSIS — M545 Low back pain, unspecified: Secondary | ICD-10-CM

## 2016-07-02 DIAGNOSIS — D638 Anemia in other chronic diseases classified elsewhere: Secondary | ICD-10-CM

## 2016-07-02 DIAGNOSIS — N401 Enlarged prostate with lower urinary tract symptoms: Secondary | ICD-10-CM

## 2016-07-02 DIAGNOSIS — I25810 Atherosclerosis of coronary artery bypass graft(s) without angina pectoris: Secondary | ICD-10-CM

## 2016-07-02 DIAGNOSIS — E785 Hyperlipidemia, unspecified: Secondary | ICD-10-CM | POA: Diagnosis not present

## 2016-07-02 DIAGNOSIS — N138 Other obstructive and reflux uropathy: Secondary | ICD-10-CM

## 2016-07-02 DIAGNOSIS — B965 Pseudomonas (aeruginosa) (mallei) (pseudomallei) as the cause of diseases classified elsewhere: Secondary | ICD-10-CM

## 2016-07-02 DIAGNOSIS — I169 Hypertensive crisis, unspecified: Secondary | ICD-10-CM

## 2016-07-02 NOTE — Progress Notes (Signed)
: Provider:  Noah Delaine. [name], MD Location:  [place] Room Number: [phone] Place of Service:  SNF [phone])  PCP: [name], MD Patient Care Team: Hulan Fess, MD as PCP - General (Family Medicine) Belva Crome, MD (Cardiology) Laurence Spates, MD (Gastroenterology) Heath Lark, MD as Consulting Physician (Hematology and Oncology) Patsey Berthold, NP as Nurse Practitioner (Cardiology) Cleon Gustin, MD as Consulting Physician (Urology) Greer Pickerel, MD as Consulting Physician (General Surgery)  Extended Emergency Contact Information Primary Emergency Contact: Lorelee Market Address: [address] [place], [place] [address] [place] Phone: [phone] Mobile Phone: [phone] Relation: Spouse Secondary Emergency Contact: Brach,Charles Address: [address] [place], [place] [address] [place] Phone: [phone] Mobile Phone: [phone] Relation: Son     Allergies: Nsaids; Ibuprofen; and Ace inhibitors  Chief Complaint  Patient presents with  . New Admit To SNF    Admit to Facility    HPI: Patient is 79 y.o. male with multiple comorbidities including CAD with history of CABG, PCI, PAF/sick sinus syndrome status post PPM (was removed recently due to systemic infection), taken off anticoagulation due to bleeding complications, chronic diastolic CHF, hypertension, diabetes mellitus, CAD stage IV, myelodysplastic syndrome and hypothyroidism was brought to the ED by his wife and daughter with generalized weakness and fall at home 3 days prior to admission. 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 difficile colitis and pseudomonal UTI.He was discharged on Coumadin for DVT. Pt was admitted to [facility] from 12/14-21 where he was dx and treated for a hypertensive crisis, acute C diff colitis and acute on  CKD4, recurrent Pseudomonas UTI and acute on chronic diastolic CHF.Pt is admitted to SNF for OT/PT. While at SNF pt will be followed for PAF, tx with amiodarone and coreg and warfarin, BPH, tx with flomax and upper ext DVT, tx with warfarin.  Past Medical History:  Diagnosis Date  . Angiomyolipoma 2009   On both kidneys noted in 2009  . Arthritis    neck and left wrist  . Atrial fibrillation ([place])   . CAD (coronary artery disease)    s/b CABG 1994, and subsequent stents. Repeat CABG 12/2011,  . Chronic diastolic heart failure ([place])   . CKD (chronic kidney disease)   . Clostridium difficile diarrhea 03/2016  . Depressive disorder   . Diabetes mellitus type 2 with peripheral artery disease (HCC)    DIET CONTROLLED  . DVT (deep venous thrombosis) ([place]) 2011   Right arm  . Gastroparesis   . GERD (gastroesophageal reflux disease)   . Gout   . History of hiatal hernia   . Hyperlipidemia   . Hypertension   . Internal hemorrhoids without mention of complication   . Ischemic colitis ([place])   . 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 Myelodysplastic syndrome ([place]) 05/22/2013   With low hemoglobin and platelets treated with Procrit  . Osteopenia   . Peptic ulcer    S/p partial gastrectomy in 1969  . Peripheral artery disease ([place])   . Pneumonia 01/16/2016  . Pneumonia 03/2016  . Presence of permanent cardiac pacemaker   . Prostate cancer ([place]) 1997   XRT and lupron  . Renal artery stenosis ([place])   . Sick sinus syndrome ([place])   .  Vitamin B 12 deficiency     Past Surgical History:  Procedure Laterality Date  . ABDOMINAL ANGIOGRAM N/A 05/25/2011   Procedure: ABDOMINAL ANGIOGRAM;  Surgeon: Serafina Mitchell, MD;  Location: [facility] CATH LAB;  Service: Cardiovascular;  Laterality: N/A;  . ABDOMINAL ANGIOGRAM N/A 02/13/2013   Procedure: ABDOMINAL ANGIOGRAM;  Surgeon: Serafina Mitchell, MD;  Location: [facility] CATH LAB;  Service: Cardiovascular;  Laterality: N/A;  . APPENDECTOMY  1991  . CELIAC ARTERY ANGIOPLASTY   05-16-12   and stenting  . CHOLECYSTECTOMY  Oct 2009   Laparoscopic  . CORONARY ARTERY BYPASS GRAFT  01/22/1993  . CORONARY ARTERY BYPASS GRAFT  01/03/2012   Procedure: REDO CORONARY ARTERY BYPASS GRAFTING (CABG);  Surgeon: Gaye Pollack, MD;  Location: [place];  Service: Open Heart Surgery;  Laterality: N/A;  Redo CABG x  using bilateral internal mammary arteries;  left leg greater saphenous vein harvested endoscopically  . FOREIGN BODY REMOVAL ABDOMINAL Right 01/27/2016   Procedure: EXPOSURE OF RIGHT COMMON FEMORAL ARTERY AND REMOVAL FOREIGN;  Surgeon: Evans Lance, MD;  Location: [place];  Service: Cardiovascular;  Laterality: Right;  . HIATAL HERNIA REPAIR     and ulcer repair  . I&D EXTREMITY Left 01/21/2016   Procedure: IRRIGATION AND DEBRIDEMENT EXTREMITY;  Surgeon: Milly Jakob, MD;  Location: [place];  Service: Orthopedics;  Laterality: Left;  . INGUINAL HERNIA REPAIR Right 10/28/2015   Procedure: OPEN RIGHT INGUINAL HERNIA REPAIR;  Surgeon: Greer Pickerel, MD;  Location: [facility];  Service: General;  Laterality: Right;  . INSERTION OF MESH Right 10/28/2015   Procedure: INSERTION OF MESH;  Surgeon: Greer Pickerel, MD;  Location: [facility];  Service: General;  Laterality: Right;  . IR GENERIC HISTORICAL  02/02/2016   IR FLUORO GUIDE CV LINE LEFT 02/02/2016 Markus Daft, MD [facility]  . IR GENERIC HISTORICAL  02/02/2016   IR US GUIDE VASC ACCESS LEFT 02/02/2016 Markus Daft, MD [facility]  . IR GENERIC HISTORICAL  03/31/2016   IR REMOVAL TUN CV CATH W/O FL 03/31/2016 Arne Cleveland, MD [facility]  . LEFT HEART CATHETERIZATION WITH CORONARY/GRAFT ANGIOGRAM  12/24/2011   Procedure: LEFT HEART CATHETERIZATION WITH Beatrix Fetters;  Surgeon: Sinclair Grooms, MD;  Location: [facility] CATH LAB;  Service: Cardiovascular;;  . LOWER EXTREMITY ANGIOGRAM Bilateral 05/25/2011   Procedure: LOWER EXTREMITY ANGIOGRAM;  Surgeon: Serafina Mitchell, MD;  Location: [facility] CATH LAB;  Service: Cardiovascular;  Laterality:  Bilateral;  . OTHER SURGICAL HISTORY  02/13/13   superior mesenteric artery angiogram  . OTHER SURGICAL HISTORY  05/16/12   Stent in stomach  . PACEMAKER GENERATOR CHANGE  12/10/2003   SJM Identity XL DR performed by Dr Leonia Reeves  . PACEMAKER INSERTION  10/18/1994   DDD pacemaker, [employer]. Gen change 12/10/2003.  Marland Kitchen PACEMAKER LEAD REMOVAL Right 01/27/2016   Procedure: PACEMAKER EXTRACTION;  Surgeon: Evans Lance, MD;  Location: [place];  Service: Cardiovascular;  Laterality: Right;  DR. Roxy Manns TO BACKUP CASE  . PARTIAL GASTRECTOMY  1969   Hx of ulcer s/p partial gastrectomy/ has pernicious anemia  . RENAL ANGIOGRAM N/A 02/13/2013   Procedure: RENAL ANGIOGRAM;  Surgeon: Serafina Mitchell, MD;  Location[facility] CATH LAB;  Service: Cardiovascular;  Laterality: N/A;  . TEE WITHOUT CARDIOVERSION N/A 01/26/2016   Procedure: TRANSESOPHAGEAL ECHOCARDIOGRAM (TEE);  Surgeon: Sueanne Margarita, MD;  Location: [facility] ENDOSCOPY;  Service: Cardiovascular;  Laterality: N/A;  . TEE WITHOUT CARDIOVERSION N/A 01/27/2016   Procedure: TRANSESOPHAGEAL ECHOCARDIOGRAM (TEE);  Surgeon: Evans Lance, MD;  Location: [place];  Service: Cardiovascular;  Laterality: N/A;  . TEE WITHOUT CARDIOVERSION N/A 04/12/2016   Procedure: TRANSESOPHAGEAL ECHOCARDIOGRAM (TEE);  Surgeon: Dorothy Spark, MD;  Location: [place];  Service: Cardiovascular;  Laterality: N/A;  . THROMBECTOMY / EMBOLECTOMY SUBCLAVIAN ARTERY  02/02/10   Right subclavian thromboectomy and venous angioplasty, and chronic mesenteric ischemia with Herculink stenting to superior mesenteric and celiac arteries - Dr. Trula Slade  . TRANSURETHRAL RESECTION OF PROSTATE N/A 02/22/2016   Procedure: CYSTO, CLOT EVACUATION, FULGERATION;  Surgeon: Alexis Frock, MD;  [facility];  Service: Urology;  Laterality: N/A;  . VISCERAL ANGIOGRAM N/A 05/25/2011   Procedure: VISCERAL ANGIOGRAM;  Surgeon: Serafina Mitchell, MD;  Location: [facility] CATH LAB;  Service: Cardiovascular;  Laterality: N/A;  .  VISCERAL ANGIOGRAM Bilateral 12/28/2011   Procedure: VISCERAL ANGIOGRAM;  Surgeon: Serafina Mitchell, MD;  Location: [facility] CATH LAB;  Service: Cardiovascular;  Laterality: Bilateral;  . VISCERAL ANGIOGRAM N/A 05/16/2012   Procedure: VISCERAL ANGIOGRAM;  Surgeon: Serafina Mitchell, MD;  Location: [facility] CATH LAB;  Service: Cardiovascular;  Laterality: N/A;  . VISCERAL ANGIOGRAM N/A 02/13/2013   Procedure: VISCERAL ANGIOGRAM;  Surgeon: Serafina Mitchell, MD;  Location: [facility] CATH LAB;  Service: Cardiovascular;  Laterality: N/A;    Allergies as of 07/02/2016      Reactions   Nsaids Nausea Only   Reaction:  GI upset    Ibuprofen Other (See Comments)   Reaction:  GI upset    Ace Inhibitors Cough      Medication List       Accurate as of 07/02/16 10:24 AM. Always use your most recent med list.          acetaminophen 650 MG CR tablet Commonly known as:  TYLENOL Take 650 mg by mouth 2 (two) times daily.   amiodarone 200 MG tablet Commonly known as:  PACERONE Take 100 mg by mouth daily.   amLODipine 5 MG tablet Commonly known as:  NORVASC Take 1 tablet (5 mg total) by mouth daily.   atorvastatin 80 MG tablet Commonly known as:  LIPITOR Take 80 mg by mouth at bedtime.   calcium carbonate 1500 (600 Ca) MG Tabs tablet Commonly known as:  OSCAL Take 600 mg of elemental calcium by mouth daily with breakfast.   carvedilol 25 MG tablet Commonly known as:  COREG Take 1 tablet (25 mg total) by mouth 2 (two) times daily with a meal.   cyanocobalamin 1000 MCG/ML injection Commonly known as:  (VITAMIN B-12) Inject 1,000 mcg into the muscle every 30 (thirty) days.   epoetin alfa 10000 UNIT/ML injection Commonly known as:  EPOGEN,PROCRIT Inject 10,000 Units into the skin every 14 (fourteen) days.   feeding supplement (GLUCERNA 1.2 CAL) Liqd Take 237 mLs by mouth 2 (two) times daily as needed (for nutritional supplement).   ferrous sulfate 325 (65 FE) MG tablet Take 1 tablet (325 mg total) by mouth  3 (three) times daily with meals.   hydrALAZINE 25 MG tablet Commonly known as:  APRESOLINE Take 3 tablets (75 mg total) by mouth 4 (four) times daily.   isosorbide mononitrate 120 MG 24 hr tablet Commonly known as:  IMDUR Take 120 mg by mouth daily.   leuprolide 30 MG injection Commonly known as:  LUPRON Inject 30 mg into the muscle every 4 (four) months.   levothyroxine 50 MCG tablet Commonly known as:  SYNTHROID Take 1 tablet (50 mcg total) by mouth daily before breakfast.  multivitamin with minerals Tabs tablet Take 1 tablet by mouth daily.   nystatin powder Generic drug:  nystatin Apply 1 g topically 2 (two) times daily.   silodosin 8 MG Caps capsule Commonly known as:  RAPAFLO Take 8 mg by mouth at bedtime.   vancomycin 50 mg/mL oral solution Commonly known as:  VANCOCIN Take 10 mLs (500 mg total) by mouth every 6 (six) hours.   warfarin 6 MG tablet Commonly known as:  COUMADIN Take 1 tablet (6 mg total) by mouth every evening.       No orders of the defined types were placed in this encounter.   Immunization History  Administered Date(s) Administered  . Influenza Split 03/29/2015  . Influenza,inj,Quad PF,36+ Mos 03/20/2013  . PPD Test 01/16/2016    Social History  Substance Use Topics  . Smoking status: Former Smoker    Years: 20.00    Types: Cigarettes    Quit date: 07/12/1969  . Smokeless tobacco: Never Used  . Alcohol use No    Family history is   Family History  Problem Relation Age of Onset  . Diabetes Mother   . Heart disease Mother     Heart Disease before age 26  . Hyperlipidemia Mother   . Hypertension Mother   . CVA Mother 72    cause of death  . Cancer Father     stomach/liver  . Hypertension Father     possibly hypertensive  . Cancer Sister     Breast cancer  . CAD Brother   . Heart disease Brother   . Cancer Paternal Uncle     colon  . Heart disease Sister   . Diabetes Sister   . Hypertension Sister   . Heart  disease Daughter     Heart Disease before age 43  . Hypertension Daughter   . Heart attack Daughter       Review of Systems  DATA OBTAINED: from patient, nurse GENERAL:  no fevers, fatigue, appetite changes SKIN: No itching, or rash EYES: No eye pain, redness, discharge EARS: No earache, tinnitus, change in hearing NOSE: No congestion, drainage or bleeding  MOUTH/THROAT: No mouth or tooth pain, No sore throat RESPIRATORY: No cough, wheezing, SOB CARDIAC: No chest pain, palpitations, lower extremity edema ; dry weight estimated 170 pounds GI: No abdominal pain, No N/V/D or constipation, No heartburn or reflux  GU: No dysuria, frequency or urgency, or incontinence  MUSCULOSKELETAL: No unrelieved bone/joint pain NEUROLOGIC: No headache, dizziness or focal weakness PSYCHIATRIC: No c/o anxiety or sadness   Vitals:   07/02/16 1002  BP: 138/66  Pulse: 69  Resp: 18  Temp: 98.4 F (36.9 C)    SpO2 Readings from Last 1 Encounters:  07/01/16 98%   Body mass index is 25.08 kg/m.     Physical Exam  GENERAL APPEARANCE: Alert, conversant,  No acute distress.  SKIN: No diaphoresis rash HEAD: Normocephalic, atraumatic  EYES: Conjunctiva/lids clear. Pupils round, reactive. EOMs intact.  EARS: External exam WNL, canals clear. Hearing grossly normal.  NOSE: No deformity or discharge.  MOUTH/THROAT: Lips w/o lesions  RESPIRATORY: Breathing is even, unlabored. Lung sounds are clear   CARDIOVASCULAR: Heart RRR no murmurs, rubs or gallops. No peripheral edema.   GASTROINTESTINAL: Abdomen is soft, non-tender, not distended w/ normal bowel sounds. GENITOURINARY: Bladder non tender, not distended  MUSCULOSKELETAL: No abnormal joints or musculature NEUROLOGIC:  Cranial nerves 2-12 grossly intact. Moves all extremities  PSYCHIATRIC: Mood and affect appropriate to situation, no behavioral  issues  Patient Active Problem List   Diagnosis Date Noted  . Hypertensive crisis 06/24/2016  .  Acute back pain 06/24/2016  . Acute on chronic diastolic CHF (congestive heart failure) ([place]) 06/24/2016  . Debility 06/24/2016  . Leg weakness, bilateral 06/24/2016  . UTI (urinary tract infection) 06/24/2016  . Fall 06/24/2016  . Warfarin anticoagulation 06/24/2016  . Hypertensive urgency 06/24/2016  . Encounter for therapeutic drug monitoring 06/07/2016  . Pressure injury of skin 05/27/2016  . Acute deep vein thrombosis (DVT) of right upper extremity ([place]) 05/25/2016  . History of pacemaker 05/03/2016  . Anemia, iron deficiency 04/16/2016  . Endocarditis of tricuspid valve   . C. difficile colitis 04/10/2016  . HCAP (healthcare-associated pneumonia) 04/08/2016  . Chronic kidney disease, stage III (moderate) 03/09/2016  . Pressure ulcer 02/23/2016  . Seizure ([place]) 02/20/2016  . Palliative care encounter   . Goals of care, counseling/discussion   . Hypotension   . Pacemaker infection ([place])   . Acute renal failure superimposed on stage 4 chronic kidney disease ([place]) 01/26/2016  . Bacteremia due to Staphylococcus aureus   . Pseudogout involving multiple joints 01/24/2016  . Bladder mass 01/24/2016  . Hemoptysis   . Pain   . Left upper extremity swelling   . Staphylococcus aureus bacteremia 01/17/2016  . CAP (community acquired pneumonia) 01/16/2016  . S/P hernia repair 10/28/2015  . Right inguinal hernia 09/09/2015  . GIB (gastrointestinal bleeding) 08/29/2015  . Hematuria 05/19/2015  . Thrombocytopenia ([place]) 11/11/2014  . Leukopenia 11/11/2014  . B12 deficiency 08/31/2013  . Pacemaker 08/26/2013  . On amiodarone therapy 08/21/2013  . Gout due to renal impairment, left ankle and foot 06/13/2013    Class: Acute  . CAD of autologous bypass graft 06/05/2013  . MDS (myelodysplastic syndrome) ([place]) 05/22/2013  . Anemia of chronic disease 03/19/2013  . CKD (chronic kidney disease), stage IV ([place]) 03/18/2013  . NSVT (nonsustained ventricular tachycardia) ([place]) 03/18/2013  .  Chronic diastolic heart failure ([place]) 03/17/2013  . Diabetes mellitus ([place]) 03/17/2013  . Essential hypertension 03/17/2013  . Mesenteric artery stenosis ([place]) 05/01/2012  . PAF (paroxysmal atrial fibrillation) ([place]) 12/24/2011  . PAD (peripheral artery disease) ([place]) 12/24/2011  . Chronic vascular insufficiency of intestine (HCC) 12/20/2011      Labs reviewed: Basic Metabolic Panel:    Component Value Date/Time   NA 136 06/30/2016 0520   NA 136 (A) 06/30/2016 0520   NA 146 (H) 08/10/2013 1324   K 3.3 (L) 06/30/2016 0520   K 3.9 08/10/2013 1324   CL 107 06/30/2016 0520   CO2 21 (L) 06/30/2016 0520   CO2 27 08/10/2013 1324   GLUCOSE 112 (H) 06/30/2016 0520   GLUCOSE 105 08/10/2013 1324   BUN 53 (H) 06/30/2016 0520   BUN 53 (A) 06/30/2016 0520   BUN 40.3 (H) 08/10/2013 1324   CREATININE 2.82 (H) 06/30/2016 0520   CREATININE 2.8 (A) 06/30/2016 0520   CREATININE 3.05 (H) 06/18/2016 1627   CREATININE 2.6 (H) 08/10/2013 1324   CALCIUM 8.3 (L) 06/30/2016 0520   CALCIUM 9.4 08/10/2013 1324   PROT 8.2 (H) 06/24/2016 1556   PROT 7.0 08/10/2013 1324   ALBUMIN 2.9 (L) 06/24/2016 1556   ALBUMIN 3.8 08/10/2013 1324   AST 19 06/24/2016 1556   AST 14 08/10/2013 1324   ALT 9 (L) 06/24/2016 1556   ALT 8 08/10/2013 1324   ALKPHOS 52 06/24/2016 1556   ALKPHOS 47 08/10/2013 1324   BILITOT 0.5 06/24/2016 1556   BILITOT 0.33  08/10/2013 1324   GFRNONAA 20 (L) 06/30/2016 0520   GFRAA 23 (L) 06/30/2016 0520     Recent Labs  02/01/16 0450 02/02/16 0553 02/02/16 0554 02/03/16 0601  06/24/16 1556  06/28/16 0401 06/29/16 0455 06/30/16 0520  NA 139 139  --  139  < > 135*  135  < > 135*  135 137  137 136*  136  K 3.3* 3.3*  --  3.8  < > 3.8  3.8  < > 3.5  3.5 3.6  3.6 3.3*  CL 110 109  --  109  < > 103  < > 105 106 107  CO2 21* 23  --  22  < > 23  < > 21* 23 21*  GLUCOSE 97 145*  --  118*  < > 131*  < > 145* 111* 112*  BUN 89* 82*  --  76*  < > 53*  53*  < > 57*  57* 56*   56* 53*  53*  CREATININE 4.04* 3.73*  --  3.61*  < > 2.79*  2.8*  < > 2.93*  2.9* 3.06*  3.1* 2.82*  2.8*  CALCIUM 8.2* 8.1*  --  8.2*  < > 8.8*  < > 8.1* 8.2* 8.3*  MG  --   --  1.8  --   --  1.9  --   --   --   --   PHOS 3.9 3.5  --  4.1  --   --   --   --   --   --   < > = values in this interval not displayed. Liver Function Tests:  Recent Labs  04/09/16 0530 06/18/16 1627 06/24/16 1556  AST 18 26  26 19   ALT 8* 8*  8* 9*  ALKPHOS 47 40  40 52  BILITOT 0.5 0.3 0.5  PROT 6.3* 6.5 8.2*  ALBUMIN 2.7* 2.6* 2.9*    Recent Labs  04/08/16 1503  LIPASE 31   No results for input(s): AMMONIA in the last 8760 hours. CBC:  Recent Labs  05/25/16 1512  06/21/16 1124 06/24/16 1556  06/28/16 0401 06/29/16 0455 06/30/16 0520  WBC 9.0  < > 5.0 12.0  12.0*  < > 20.2  20.2* 17.8  17.8* 12.8  12.8*  NEUTROABS 6.8  --  3.6 9.5*  --   --   --   --   HGB 10.2*  < > 9.1* 10.8*  10.8*  < > 8.3*  8.3* 8.4*  8.4* 9.1*  HCT 32.5*  < > 29.0* 32.4*  32*  < > 24.7*  25* 25.6*  26* 27.5*  MCV 93.4  < > 92.7 89.8  < > 88.5 88.9 88.1  PLT 133*  < > 184 221  221  < > 200  200 191  191 212  < > = values in this interval not displayed. Lipid  Recent Labs  03/30/16  CHOL 122  HDL 51  LDLCALC 58  TRIG 67    Cardiac Enzymes:  Recent Labs  01/25/16 1519  04/08/16 2303 04/09/16 0530 04/09/16 1110  CKTOTAL 60  --   --   --   --   TROPONINI  --   < > 0.07* 0.08* 0.07*  < > = values in this interval not displayed. BNP:  Recent Labs  06/18/16 1627 06/24/16 1556 06/25/16 0340  BNP 891.5* 1,575.0* 1,430.8*   No results found for: [facility] Lab Results  Component Value Date  HGBA1C 4.9 02/20/2016   Lab Results  Component Value Date   TSH 22.371 (H) 06/25/2016   Lab Results  Component Value Date   VITAMINB12 >2000 (H) 09/02/2015   Lab Results  Component Value Date   FOLATE 9.2 03/17/2013   Lab Results  Component Value Date   IRON 76 09/02/2015    TIBC 190 (L) 09/02/2015   FERRITIN 557 (H) 09/02/2015    Imaging and Procedures obtained prior to SNF admission: Dg Chest 2 View  Result Date: 06/24/2016 CLINICAL DATA:  Weakness, recent endocarditis EXAM: CHEST  2 VIEW COMPARISON:  05/25/2016 FINDINGS: Cardiomediastinal silhouette is stable. Status post CABG. Atherosclerotic calcifications of thoracic aorta. Bilateral small pleural effusion. Streaky bilateral basilar atelectasis or infiltrate right greater than left. No pulmonary edema. IMPRESSION: Status post CABG. Atherosclerotic calcifications of thoracic aorta. Bilateral small pleural effusion. Streaky bilateral basilar atelectasis or infiltrate right greater than left. No pulmonary edema. Electronically Signed   By: Lahoma Crocker M.D.   On: 06/24/2016 15:05   Dg Ribs Unilateral Right  Result Date: 06/24/2016 CLINICAL DATA:  80 year old male with fall and a right posterior rib pain. EXAM: RIGHT RIBS - 2 VIEW COMPARISON:  Chest radiograph dated 06/24/2016 and chest radiograph dated 04/08/2016 FINDINGS: There is an age indeterminate nondisplaced fracture of the posterolateral aspect of the right ninth rib. The bones are osteopenic which limits evaluation for fracture. No other fracture identified. There is a small right pleural effusion. Right lung base opacity may represent atelectatic changes versus infiltrate. Trace fluid noted in the minor fissure. There is cardiomegaly. Median sternotomy wires and CABG surgical clips noted. There is atherosclerotic calcification of the aorta. There is degenerative changes of the spine. Right upper quadrant cholecystectomy clips noted. IMPRESSION: Nondisplaced fracture of the posterior aspect of the right ninth rib, age indeterminate. No pneumothorax. Small right pleural effusion with right lung base atelectasis versus infiltrate. Electronically Signed   By: Anner Crete M.D.   On: 06/24/2016 23:28   Mr Brain Wo Contrast  Result Date: 06/24/2016 CLINICAL  DATA:  LEFT lower extremity weakness. History of prostate cancer, diabetes. EXAM: MRI HEAD WITHOUT CONTRAST TECHNIQUE: Multiplanar, multiecho pulse sequences of the brain and surrounding structures were obtained without intravenous contrast. COMPARISON:  CT HEAD February 20, 2016 FINDINGS: BRAIN: No reduced diffusion to suggest acute ischemia or hypercellular tumor. Punctate chronic microhemorrhage LEFT parietal lobe. The ventricles and sulci are normal for patient's age. Patchy to confluent supratentorial white matter FLAIR T2 hyperintensities. No suspicious parenchymal signal, masses or mass effect. Multiple small cortical infarcts. Prominent symmetric perivascular spaces, normal variant. No abnormal extra-axial fluid collections. No extra-axial masses though, contrast enhanced sequences would be more sensitive. VASCULAR: Normal major intracranial vascular flow voids present at skull base. SKULL AND UPPER CERVICAL SPINE: No abnormal sellar expansion. No suspicious calvarial bone marrow signal. Craniocervical junction maintained. Trace atlantodental fusion. SINUSES/ORBITS: Lobulated LEFT maxillary sinus mucosal thickening on, trace remaining paranasal sinus mucosal thickening. Trace mastoid effusions. The included ocular globes and orbital contents are non-suspicious. OTHER: None. IMPRESSION: No acute intracranial process. Involutional changes. Moderate chronic small vessel ischemic disease. Electronically Signed   By: Elon Alas M.D.   On: 06/24/2016 21:16   Mr Lumbar Spine Wo Contrast  Result Date: 06/25/2016 CLINICAL DATA:  80 year old male with generalized weakness for 3 days. Lumbar back pain after a fall. Leg weakness. Initial encounter. EXAM: MRI LUMBAR SPINE WITHOUT CONTRAST TECHNIQUE: Multiplanar, multisequence MR imaging of the lumbar spine was performed. No intravenous contrast was  administered. COMPARISON:  CT Abdomen and Pelvis 04/08/2016 FINDINGS: MRI imaging quality of the lumbar spine is  significantly degraded by generalize loss of signal despite repeated imaging attempts. The etiology of the signal loss seems to be generalized soft tissue edema such as anasarca. Segmentation: Normal as seen on the comparison CT Abdomen and Pelvis. Alignment: Stable since the prior CT. Mild grade 1 anterolisthesis of L5 on S1. Mild dextroconvex lumbar scoliosis. Vertebrae: Suboptimal characterization of marrow due to the decreased signal to noise. No definite marrow edema or acute osseous abnormality. Conus medullaris: Extends to the L1 level and appears normal. Paraspinal and other soft tissues: Diffuse soft tissue edema suspected such is anasarca. This also seems to affect the visible paraspinal muscles bilaterally. Very limited visualization of abdominal viscera. The visualized kidneys appear stable to the prior CT. Disc levels: No significant lumbar spinal stenosis. Mild lumbar spine degeneration above L4. At L4-L5 there is disc bulging and moderate to severe facet and ligament flavum hypertrophy. Both facet joints are capacious and contain fluid. Small posteriorly situated synovial cyst on the left. No significant stenosis. Chronic mild anterolisthesis at L5-S1 with circumferential disc bulge and moderate to severe facet hypertrophy. No spinal stenosis. Up to mild lateral recess stenosis and bilateral L5 foraminal stenosis. IMPRESSION: 1. MRI of the lumbar spine significantly degraded by signal loss despite repeated imaging attempts, which seems to be due to generalized body soft tissue edema such as due to anasarca. 2. Given #1, diffuse lumbar Myositis is difficult to exclude. No acute osseous abnormality identified. 3. No significant lumbar spinal stenosis. Spinal degeneration primarily only at L4-L5 and L5-S1 where there is facet arthropathy in part related to mild spondylolisthesis. Subsequent up to mild multifactorial lateral recess and foraminal stenosis. Electronically Signed   By: Genevie Ann M.D.   On:  06/25/2016 10:29     Not all labs, radiology exams or other studies done during hospitalization come through on my EPIC note; however they are reviewed by me.    Assessment and Plan  HYPERTENSIVE CRISIS - improved with pain control; norvasc was added to pt's regimen SNF - cont coreg 25 mg BID, hydralazine 75 mg QID, indur 120 mg daily and norvasc 10 mg daily;will titratye as needed  ACUTE C DIFF DIARRHEA - pt recently on prolonged abx; will be treated with Vanc for 2 weeks SNF - cont vanc 500 mg q 6 for 14 days  ACUTE ON CKD4/BPH-Worsened renal fn with moderate right-sided hydronephrosis seen on Imaging. Hx of hemorrhagic cystitis with bladder clot, previously requiring CBI and foley for several months. Foley placed in with good urine output. Recommend to leave Foley catheter for now have patient follow-up with urology -Renal function improved Resolved right hydronephrosis SNF - f/u BMP; set up for outpt urology; cont flomax for BPH  PSEUDOMONAS UTI- Cefepime switched to Zosyn to cover intra-abdominal infection. Pt had 7 days on day of discharge  ACUTE ON CHRONIC DIASTOLIC CHF -resolved SNF - pt to be on home meds but lasix not listed;investigations shows pt to be on lasix prior to hospitalization; will restart lasix 40 mg daily and cont coreg 25 mg BID  PAF SNF - controlled with coreg 25 mg BID, amiodarone 100 mfg daily and prophylaxed with warfarin.  ANEMIA OF CHRONIC DISEASE SNF - plan to monitor CBC; procrit 10,000 u q 14 days  LBP/LE WEAKNESS- MRI back neg SNF - cont norco; will not wish to use dilaudid on chronic pain  RECENT UE DVT SNF- cont  coumadin  CAD SNF - no CP ; cont with coreg 25 mg BID, warfarin, lipitor  HLD  SNF - not stated as uncontrolled;cont lipitor 80 mg qHS    Time spent > 45 min;> 50% of time with patient was spent reviewing records, labs, tests and studies, counseling and developing plan of care   Webb Silversmith D. [name], MD

## 2016-07-06 ENCOUNTER — Encounter: Payer: Self-pay | Admitting: Internal Medicine

## 2016-07-06 ENCOUNTER — Encounter: Payer: Self-pay | Admitting: Interventional Cardiology

## 2016-07-06 DIAGNOSIS — N400 Enlarged prostate without lower urinary tract symptoms: Secondary | ICD-10-CM | POA: Insufficient documentation

## 2016-07-06 DIAGNOSIS — E785 Hyperlipidemia, unspecified: Secondary | ICD-10-CM | POA: Insufficient documentation

## 2016-07-06 LAB — BASIC METABOLIC PANEL
BUN: 53 mg/dL — AB (ref 4–21)
CREATININE: 2.7 mg/dL — AB (ref 0.6–1.3)
Glucose: 113 mg/dL
POTASSIUM: 3.9 mmol/L (ref 3.4–5.3)
Sodium: 141 mmol/L (ref 137–147)

## 2016-07-06 LAB — CBC AND DIFFERENTIAL
HCT: 28 % — AB (ref 41–53)
Hemoglobin: 8.5 g/dL — AB (ref 13.5–17.5)
Platelets: 191 10*3/uL (ref 150–399)
WBC: 9.5 10^3/mL

## 2016-07-07 ENCOUNTER — Telehealth: Payer: Self-pay | Admitting: *Deleted

## 2016-07-07 NOTE — Telephone Encounter (Signed)
Per [facility] nurse for pt Dr [name] does check and manage Coumadin, last INR was 2.0 on 12/26 and he is do for another INR o n 12/29.

## 2016-07-13 ENCOUNTER — Other Ambulatory Visit (HOSPITAL_BASED_OUTPATIENT_CLINIC_OR_DEPARTMENT_OTHER): Payer: Medicare Other

## 2016-07-13 ENCOUNTER — Ambulatory Visit (HOSPITAL_BASED_OUTPATIENT_CLINIC_OR_DEPARTMENT_OTHER): Payer: Medicare Other

## 2016-07-13 ENCOUNTER — Other Ambulatory Visit: Payer: Self-pay | Admitting: Hematology and Oncology

## 2016-07-13 VITALS — BP 155/65 | HR 67 | Temp 97.4°F | Resp 20

## 2016-07-13 DIAGNOSIS — N183 Chronic kidney disease, stage 3 (moderate): Secondary | ICD-10-CM | POA: Diagnosis not present

## 2016-07-13 DIAGNOSIS — D469 Myelodysplastic syndrome, unspecified: Secondary | ICD-10-CM

## 2016-07-13 LAB — CBC & DIFF AND RETIC
BASO%: 0.6 % (ref 0.0–2.0)
Basophils Absolute: 0 10*3/uL (ref 0.0–0.1)
EOS ABS: 0.1 10*3/uL (ref 0.0–0.5)
EOS%: 1.9 % (ref 0.0–7.0)
HCT: 27.2 % — ABNORMAL LOW (ref 38.4–49.9)
HGB: 8.6 g/dL — ABNORMAL LOW (ref 13.0–17.1)
IMMATURE RETIC FRACT: 3 % (ref 3.00–10.60)
LYMPH%: 17.1 % (ref 14.0–49.0)
MCH: 29.3 pg (ref 27.2–33.4)
MCHC: 31.6 g/dL — ABNORMAL LOW (ref 32.0–36.0)
MCV: 92.5 fL (ref 79.3–98.0)
MONO#: 0.5 10*3/uL (ref 0.1–0.9)
MONO%: 7.5 % (ref 0.0–14.0)
NEUT#: 5.2 10*3/uL (ref 1.5–6.5)
NEUT%: 72.9 % (ref 39.0–75.0)
PLATELETS: 187 10*3/uL (ref 140–400)
RBC: 2.94 10*6/uL — ABNORMAL LOW (ref 4.20–5.82)
RDW: 20.4 % — ABNORMAL HIGH (ref 11.0–14.6)
Retic %: 1.75 % (ref 0.80–1.80)
Retic Ct Abs: 51.45 10*3/uL (ref 34.80–93.90)
WBC: 7.2 10*3/uL (ref 4.0–10.3)
lymph#: 1.2 10*3/uL (ref 0.9–3.3)

## 2016-07-13 MED ORDER — DARBEPOETIN ALFA 200 MCG/0.4ML IJ SOSY
200.0000 ug | PREFILLED_SYRINGE | Freq: Once | INTRAMUSCULAR | Status: AC
  Administered 2016-07-13: 200 ug via SUBCUTANEOUS
  Filled 2016-07-13: qty 0.4

## 2016-07-13 NOTE — Patient Instructions (Signed)
Darbepoetin Alfa injection What is this medicine? DARBEPOETIN ALFA (dar be POE e tin AL fa) helps your body make more red blood cells. It is used to treat anemia caused by chronic kidney failure and chemotherapy. This medicine may be used for other purposes; ask your health care provider or pharmacist if you have questions. What should I tell my health care provider before I take this medicine? They need to know if you have any of these conditions: -blood clotting disorders or history of blood clots -cancer patient not on chemotherapy -cystic fibrosis -heart disease, such as angina, heart failure, or a history of a heart attack -hemoglobin level of 12 g/dL or greater -high blood pressure -low levels of folate, iron, or vitamin B12 -seizures -an unusual or allergic reaction to darbepoetin, erythropoietin, albumin, hamster proteins, latex, other medicines, foods, dyes, or preservatives -pregnant or trying to get pregnant -breast-feeding How should I use this medicine? This medicine is for injection into a vein or under the skin. It is usually given by a health care professional in a hospital or clinic setting. If you get this medicine at home, you will be taught how to prepare and give this medicine. Do not shake the solution before you withdraw a dose. Use exactly as directed. Take your medicine at regular intervals. Do not take your medicine more often than directed. It is important that you put your used needles and syringes in a special sharps container. Do not put them in a trash can. If you do not have a sharps container, call your pharmacist or healthcare provider to get one. Talk to your pediatrician regarding the use of this medicine in children. While this medicine may be used in children as young as 1 year for selected conditions, precautions do apply. Overdosage: If you think you have taken too much of this medicine contact a poison control center or emergency room at once. NOTE:  This medicine is only for you. Do not share this medicine with others. What if I miss a dose? If you miss a dose, take it as soon as you can. If it is almost time for your next dose, take only that dose. Do not take double or extra doses. What may interact with this medicine? Do not take this medicine with any of the following medications: -epoetin alfa This list may not describe all possible interactions. Give your health care provider a list of all the medicines, herbs, non-prescription drugs, or dietary supplements you use. Also tell them if you smoke, drink alcohol, or use illegal drugs. Some items may interact with your medicine. What should I watch for while using this medicine? Visit your prescriber or health care professional for regular checks on your progress and for the needed blood tests and blood pressure measurements. It is especially important for the doctor to make sure your hemoglobin level is in the desired range, to limit the risk of potential side effects and to give you the best benefit. Keep all appointments for any recommended tests. Check your blood pressure as directed. Ask your doctor what your blood pressure should be and when you should contact him or her. As your body makes more red blood cells, you may need to take iron, folic acid, or vitamin B supplements. Ask your doctor or health care provider which products are right for you. If you have kidney disease continue dietary restrictions, even though this medication can make you feel better. Talk with your doctor or health care professional about the   foods you eat and the vitamins that you take. What side effects may I notice from receiving this medicine? Side effects that you should report to your doctor or health care professional as soon as possible: -allergic reactions like skin rash, itching or hives, swelling of the face, lips, or tongue -breathing problems -changes in vision -chest pain -confusion, trouble speaking  or understanding -feeling faint or lightheaded, falls -high blood pressure -muscle aches or pains -pain, swelling, warmth in the leg -rapid weight gain -severe headaches -sudden numbness or weakness of the face, arm or leg -trouble walking, dizziness, loss of balance or coordination -seizures (convulsions) -swelling of the ankles, feet, hands -unusually weak or tired Side effects that usually do not require medical attention (report to your doctor or health care professional if they continue or are bothersome): -diarrhea -fever, chills (flu-like symptoms) -headaches -nausea, vomiting -redness, stinging, or swelling at site where injected This list may not describe all possible side effects. Call your doctor for medical advice about side effects. You may report side effects to FDA at 1-800-FDA-1088. Where should I keep my medicine? Keep out of the reach of children. Store in a refrigerator between 2 and 8 degrees C (36 and 46 degrees F). Do not freeze. Do not shake. Throw away any unused portion if using a single-dose vial. Throw away any unused medicine after the expiration date. NOTE: This sheet is a summary. It may not cover all possible information. If you have questions about this medicine, talk to your doctor, pharmacist, or health care provider.    2016, Elsevier/Gold Standard. (2008-06-11 10:23:57)  

## 2016-07-19 ENCOUNTER — Ambulatory Visit (INDEPENDENT_AMBULATORY_CARE_PROVIDER_SITE_OTHER): Payer: Medicare Other | Admitting: Interventional Cardiology

## 2016-07-19 ENCOUNTER — Encounter: Payer: Self-pay | Admitting: Interventional Cardiology

## 2016-07-19 VITALS — BP 160/74 | HR 73

## 2016-07-19 DIAGNOSIS — I10 Essential (primary) hypertension: Secondary | ICD-10-CM

## 2016-07-19 DIAGNOSIS — N184 Chronic kidney disease, stage 4 (severe): Secondary | ICD-10-CM

## 2016-07-19 DIAGNOSIS — I25810 Atherosclerosis of coronary artery bypass graft(s) without angina pectoris: Secondary | ICD-10-CM | POA: Diagnosis not present

## 2016-07-19 DIAGNOSIS — I5032 Chronic diastolic (congestive) heart failure: Secondary | ICD-10-CM | POA: Diagnosis not present

## 2016-07-19 DIAGNOSIS — I480 Paroxysmal atrial fibrillation: Secondary | ICD-10-CM

## 2016-07-19 DIAGNOSIS — A0472 Enterocolitis due to Clostridium difficile, not specified as recurrent: Secondary | ICD-10-CM

## 2016-07-19 MED ORDER — POTASSIUM CHLORIDE CRYS ER 20 MEQ PO TBCR: 20.0000 meq | EXTENDED_RELEASE_TABLET | Freq: Two times a day (BID) | ORAL | 3 refills | Status: DC

## 2016-07-19 MED ORDER — FUROSEMIDE 80 MG PO TABS: 80.0000 mg | ORAL_TABLET | Freq: Two times a day (BID) | ORAL | 3 refills | Status: AC

## 2016-07-19 NOTE — Progress Notes (Signed)
Cardiology Office Note    Date:  07/19/2016   ID:  [name] Marble Sr., DOB [birth year], MRN [id number]  PCP:  [name], MD  Cardiologist: Sinclair Grooms, MD   Chief Complaint  Patient presents with  . Shortness of Breath    History of Present Illness:  [name]. is a 81 y.o. male male hx of CAD status post CABG 1994 with subsequent stents and repeat CABG in 2013, hypertension, HLD, CKD, chronic diastolic heart failure, sinus bradycardia sp PPM (SJM) by Dr Leonia Reeves. Has PAF, CHADSVASC=5 but is not anticoagulated for atrial fibrillation due to prior GI bleed. He is on aspirin and Plavix. Saw Dr. Rayann Heman in 10/2015 to discuss watchman. Because he had no atrial fibrillation for the past 6 months conservative management was recommended. Amiodarone was continued.Recently had C. difficile colitis, bacteremia, endocarditis with lead infection. Pacer lead explantation was required. He is now on Coumadin therapy.Since last office visit on December 8, he has been readmitted to the hospital and diuretic therapy once again has been significantly decreased. He again had recurrence of C. difficile while hospitalized. The most recent discharge occurred on 07/01/16.  He is now in the [facility] skilled nursing facility. He is on furosemide 40 mg per day. He complains of significant swelling in his feet arms and back. He has orthopnea. He is in no respiratory distress. He refuses to return to the hospital because he is concerned about recurrence of C. difficile.   Past Medical History:  Diagnosis Date  . Angiomyolipoma 2009   On both kidneys noted in 2009  . Arthritis    neck and left wrist  . Atrial fibrillation ([place])   . CAD (coronary artery disease)    s/b CABG 1994, and subsequent stents. Repeat CABG 12/2011,  . Chronic diastolic heart failure ([place])   . CKD (chronic kidney disease)   . Clostridium difficile diarrhea 03/2016  . Depressive disorder   . Diabetes mellitus type 2  with peripheral artery disease (HCC)    DIET CONTROLLED  . DVT (deep venous thrombosis) ([place]) 2011   Right arm  . Gastroparesis   . GERD (gastroesophageal reflux disease)   . Gout   . History of hiatal hernia   . Hyperlipidemia   . Hypertension   . Internal hemorrhoids without mention of complication   . Ischemic colitis ([place])   . 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 Myelodysplastic syndrome ([place]) 05/22/2013   With low hemoglobin and platelets treated with Procrit  . Osteopenia   . Peptic ulcer    S/p partial gastrectomy in 1969  . Peripheral artery disease ([place])   . Pneumonia 01/16/2016  . Pneumonia 03/2016  . Presence of permanent cardiac pacemaker   . Prostate cancer ([place]) 1997   XRT and lupron  . Renal artery stenosis ([place])   . Sick sinus syndrome ([place])   . Vitamin B 12 deficiency     Past Surgical History:  Procedure Laterality Date  . ABDOMINAL ANGIOGRAM N/A 05/25/2011   Procedure: ABDOMINAL ANGIOGRAM;  Surgeon: Serafina Mitchell, MD;  Location[facility] CATH LAB;  Service: Cardiovascular;  Laterality: N/A;  . ABDOMINAL ANGIOGRAM N/A 02/13/2013   Procedure: ABDOMINAL ANGIOGRAM;  Surgeon: Serafina Mitchell, MD;  Location: [facility] CATH LAB;  Service: Cardiovascular;  Laterality: N/A;  . APPENDECTOMY  1991  . CELIAC ARTERY ANGIOPLASTY  05-16-12   and stenting  . CHOLECYSTECTOMY  Oct 2009   Laparoscopic  . CORONARY ARTERY BYPASS GRAFT  01/22/1993  . CORONARY ARTERY BYPASS GRAFT  01/03/2012   Procedure: REDO CORONARY ARTERY BYPASS GRAFTING (CABG);  Surgeon: Gaye Pollack, MD;  Location: [place];  Service: Open Heart Surgery;  Laterality: N/A;  Redo CABG x  using bilateral internal mammary arteries;  left leg greater saphenous vein harvested endoscopically  . FOREIGN BODY REMOVAL ABDOMINAL Right 01/27/2016   Procedure: EXPOSURE OF RIGHT COMMON FEMORAL ARTERY AND REMOVAL FOREIGN;  Surgeon: Evans Lance, MD;  Location: [place];  Service: Cardiovascular;  Laterality: Right;  . HIATAL HERNIA REPAIR     and  ulcer repair  . I&D EXTREMITY Left 01/21/2016   Procedure: IRRIGATION AND DEBRIDEMENT EXTREMITY;  Surgeon: Milly Jakob, MD;  Location: [place];  Service: Orthopedics;  Laterality: Left;  . INGUINAL HERNIA REPAIR Right 10/28/2015   Procedure: OPEN RIGHT INGUINAL HERNIA REPAIR;  Surgeon: Greer Pickerel, MD;  Location: [facility];  Service: General;  Laterality: Right;  . INSERTION OF MESH Right 10/28/2015   Procedure: INSERTION OF MESH;  Surgeon: Greer Pickerel, MD;  Location: [facility];  Service: General;  Laterality: Right;  . IR GENERIC HISTORICAL  02/02/2016   IR FLUORO GUIDE CV LINE LEFT 02/02/2016 Markus Daft, MD [facility]  . IR GENERIC HISTORICAL  02/02/2016   IR US GUIDE VASC ACCESS LEFT 02/02/2016 Markus Daft, MD [facility]  . IR GENERIC HISTORICAL  03/31/2016   IR REMOVAL TUN CV CATH W/O FL 03/31/2016 Arne Cleveland, MD [facility]  . LEFT HEART CATHETERIZATION WITH CORONARY/GRAFT ANGIOGRAM  12/24/2011   Procedure: LEFT HEART CATHETERIZATION WITH Beatrix Fetters;  Surgeon: Sinclair Grooms, MD;  Location: [facility] CATH LAB;  Service: Cardiovascular;;  . LOWER EXTREMITY ANGIOGRAM Bilateral 05/25/2011   Procedure: LOWER EXTREMITY ANGIOGRAM;  Surgeon: Serafina Mitchell, MD;  Location: [facility] CATH LAB;  Service: Cardiovascular;  Laterality: Bilateral;  . OTHER SURGICAL HISTORY  02/13/13   superior mesenteric artery angiogram  . OTHER SURGICAL HISTORY  05/16/12   Stent in stomach  . PACEMAKER GENERATOR CHANGE  12/10/2003   SJM Identity XL DR performed by Dr Leonia Reeves  . PACEMAKER INSERTION  10/18/1994   DDD pacemaker, [employer]. Gen change 12/10/2003.  Marland Kitchen PACEMAKER LEAD REMOVAL Right 01/27/2016   Procedure: PACEMAKER EXTRACTION;  Surgeon: Evans Lance, MD;  Location: [place];  Service: Cardiovascular;  Laterality: Right;  DR. Roxy Manns TO BACKUP CASE  . PARTIAL GASTRECTOMY  1969   Hx of ulcer s/p partial gastrectomy/ has pernicious anemia  . RENAL ANGIOGRAM N/A 02/13/2013   Procedure: RENAL ANGIOGRAM;  Surgeon: Serafina Mitchell, MD;  Location: [facility] CATH LAB;  Service: Cardiovascular;  Laterality: N/A;  . TEE WITHOUT CARDIOVERSION N/A 01/26/2016   Procedure: TRANSESOPHAGEAL ECHOCARDIOGRAM (TEE);  Surgeon: Sueanne Margarita, MD;  Location: [facility] ENDOSCOPY;  Service: Cardiovascular;  Laterality: N/A;  . TEE WITHOUT CARDIOVERSION N/A 01/27/2016   Procedure: TRANSESOPHAGEAL ECHOCARDIOGRAM (TEE);  Surgeon: Evans Lance, MD;  Location: [place];  Service: Cardiovascular;  Laterality: N/A;  . TEE WITHOUT CARDIOVERSION N/A 04/12/2016   Procedure: TRANSESOPHAGEAL ECHOCARDIOGRAM (TEE);  Surgeon: Dorothy Spark, MD;  Location: [place];  Service: Cardiovascular;  Laterality: N/A;  . THROMBECTOMY / EMBOLECTOMY SUBCLAVIAN ARTERY  02/02/10   Right subclavian thromboectomy and venous angioplasty, and chronic mesenteric ischemia with Herculink stenting to superior mesenteric and celiac arteries - Dr. Trula Slade  . TRANSURETHRAL RESECTION OF PROSTATE N/A 02/22/2016   Procedure: CYSTO, CLOT EVACUATION, FULGERATION;  Surgeon: Alexis Frock, MD;  Location: [facility];  Service:  Urology;  Laterality: N/A;  . VISCERAL ANGIOGRAM N/A 05/25/2011   Procedure: VISCERAL ANGIOGRAM;  Surgeon: Serafina Mitchell, MD;  Location: [facility] CATH LAB;  Service: Cardiovascular;  Laterality: N/A;  . VISCERAL ANGIOGRAM Bilateral 12/28/2011   Procedure: VISCERAL ANGIOGRAM;  Surgeon: Serafina Mitchell, MD;  Location: [facility] CATH LAB;  Service: Cardiovascular;  Laterality: Bilateral;  . VISCERAL ANGIOGRAM N/A 05/16/2012   Procedure: VISCERAL ANGIOGRAM;  Surgeon: Serafina Mitchell, MD;  Location: [facility] CATH LAB;  Service: Cardiovascular;  Laterality: N/A;  . VISCERAL ANGIOGRAM N/A 02/13/2013   Procedure: VISCERAL ANGIOGRAM;  Surgeon: Serafina Mitchell, MD;  Location: [facility] CATH LAB;  Service: Cardiovascular;  Laterality: N/A;    Current Medications: Outpatient Medications Prior to Visit  Medication Sig Dispense Refill  . acetaminophen (TYLENOL) 650 MG CR tablet Take 650 mg by mouth 2 (two)  times daily.    Marland Kitchen amLODipine (NORVASC) 5 MG tablet Take 1 tablet (5 mg total) by mouth daily. 30 tablet 0  . atorvastatin (LIPITOR) 80 MG tablet Take 80 mg by mouth at bedtime.     . calcium carbonate (OSCAL) 1500 (600 Ca) MG TABS tablet Take 600 mg of elemental calcium by mouth daily with breakfast.    . carvedilol (COREG) 25 MG tablet Take 1 tablet (25 mg total) by mouth 2 (two) times daily with a meal. 60 tablet 0  . cyanocobalamin (,VITAMIN B-12,) 1000 MCG/ML injection Inject 1,000 mcg into the muscle every 30 (thirty) days.     Marland Kitchen epoetin alfa (EPOGEN,PROCRIT) 13086 UNIT/ML injection Inject 10,000 Units into the skin every 14 (fourteen) days.     . ferrous sulfate 325 (65 FE) MG tablet Take 1 tablet (325 mg total) by mouth 3 (three) times daily with meals.  3  . hydrALAZINE (APRESOLINE) 25 MG tablet Take 3 tablets (75 mg total) by mouth 4 (four) times daily.    . isosorbide mononitrate (IMDUR) 120 MG 24 hr tablet Take 120 mg by mouth daily.    Marland Kitchen leuprolide (LUPRON) 30 MG injection Inject 30 mg into the muscle every 4 (four) months.    . levothyroxine (SYNTHROID) 50 MCG tablet Take 1 tablet (50 mcg total) by mouth daily before breakfast. 90 tablet 3  . Multiple Vitamin (MULTIVITAMIN WITH MINERALS) TABS tablet Take 1 tablet by mouth daily.     . Nutritional Supplements (FEEDING SUPPLEMENT, GLUCERNA 1.2 CAL,) LIQD Take 237 mLs by mouth 2 (two) times daily as needed (for nutritional supplement).     . nystatin (NYSTATIN) powder Apply 1 g topically 2 (two) times daily.    . silodosin (RAPAFLO) 8 MG CAPS capsule Take 8 mg by mouth at bedtime.     Marland Kitchen amiodarone (PACERONE) 200 MG tablet Take 100 mg by mouth daily.    Marland Kitchen warfarin (COUMADIN) 6 MG tablet Take 1 tablet (6 mg total) by mouth every evening. (Patient not taking: Reported on 07/19/2016)     No facility-administered medications prior to visit.      Allergies:   Nsaids; Ibuprofen; and Ace inhibitors   Social History   Social History  .  Marital status: Married    Spouse name: N/A  . Number of children: N/A  . Years of education: N/A   Social History Main Topics  . Smoking status: Former Smoker    Years: 20.00    Types: Cigarettes    Quit date: 07/12/1969  . Smokeless tobacco: Never Used  . Alcohol use No  . Drug use: No  . Sexual  activity: Yes   Other Topics Concern  . None   Social History Narrative  . None     Family History:  The patient's family history includes CAD in his brother; CVA (age of onset: 6) in his mother; Cancer in his father, paternal uncle, and sister; Diabetes in his mother and sister; Heart attack in his daughter; Heart disease in his brother, daughter, mother, and sister; Hyperlipidemia in his mother; Hypertension in his daughter, father, mother, and sister.   ROS:   Please see the history of present illness.    Decreased appetite, generalized swelling, decreased energy, weakness, inability to ambulate. Concerned about elevated blood pressure.  All other systems reviewed and are negative.   PHYSICAL EXAM:   VS:  BP (!) 160/74 (BP Location: Right Arm)   Pulse 73    GEN: Well nourished, well developed, in no acute distress  HEENT: normal  Neck: no JVD with the patient sitting at 90 in a chair in the office. No carotid bruits, or masses Cardiac: IRR, rubs, or gallops. Generalized edema in the ankles to the presacral area bilaterally. Chest wall edema is noted. Arms are swollen. There is a holosystolic murmur heard along the left sternal border. Respiratory:  clear to auscultation bilaterally, normal work of breathing GI: soft, nontender, nondistended, + BS MS: no deformity or atrophy  Skin: warm and dry, no rash Neuro:  Alert and Oriented x 3, Strength and sensation are intact Psych: euthymic mood, full affect  Wt Readings from Last 3 Encounters:  07/02/16 174 lb 13.2 oz (79.3 kg)  07/01/16 176 lb (79.8 kg)  06/07/16 172 lb 12.8 oz (78.4 kg)      Studies/Labs Reviewed:   EKG:   EKG  Not repeated.. I reviewed rhythm strips from December 15 hospital admission and normal sinus rhythm was noted.  Recent Labs: 06/24/2016: ALT 9; Magnesium 1.9 06/25/2016: B Natriuretic Peptide 1,430.8; TSH 22.371 06/30/2016: BUN 53; BUN 53; Creatinine 2.8; Creatinine, Ser 2.82; Potassium 3.3; Sodium 136; Sodium 136 07/13/2016: HGB 8.6; Platelets 187   Lipid Panel    Component Value Date/Time   CHOL 122 03/30/2016   TRIG 67 03/30/2016   HDL 51 03/30/2016   CHOLHDL 1.9 12/25/2011 0416   VLDL 8 12/25/2011 0416   LDLCALC 58 03/30/2016    Additional studies/ records that were reviewed today include:  Bilateral pleural effusions noted on CT of abdomen performed on 06/26/16.    ASSESSMENT:    1. Chronic diastolic heart failure ([place])   2. Essential hypertension   3. CAD of autologous bypass graft   4. PAF (paroxysmal atrial fibrillation) ([place])   5. CKD (chronic kidney disease), stage IV (HCC)   6. C. difficile colitis      PLAN:  In order of problems listed above:  1. There is evidence of marked volume overload with lower extremity, scrotal, presacral and abdominal wall pitting edema. Arms are also swollen. Plan to aggressively diuresed by starting furosemide 80 mg by mouth twice daily with 20 mEq of potassium twice daily. Basic metabolic panel will need to be performed in 2 days. He will need office follow-up in one week. Basic metabolic panel will be repeated at that time. If significant urine output does not occur or if swelling progresses, he should call prior to next week's office visit. 2. Blood pressure is elevated and likely due to sodium and fluid retention. 3. Asymptomatic. 4. History of paroxysmal atrial fibrillation. On amiodarone. Regular rhythm today suggests maintenance of sinus rhythm.  Laboratory data suggested hypothyroidism when seen in December. Levothyroxine has been started. 5. Stage IV chronic kidney disease, worsening. With diuresis kidney function may  worsen. Recommended hospitalization but he refused. He is concerned about recurrent C. difficile if he is hospitalized. 6. This is been recurrent over the last 2 hospital stays in November and December.  So immediate follow-up will be in one week. He will have a basic metabolic panel in 2 days. A basic metabolic panel should also be done on return. On return volume status will be reassessed. May need to add metolazone.  Prolonged office visit, due to review of multiple hospital stays, prolonged conversation with the patient and wife, greater than 60% of the visit was spent in counseling.  Medication Adjustments/Labs and Tests Ordered: Current medicines are reviewed at length with the patient today.  Concerns regarding medicines are outlined above.  Medication changes, Labs and Tests ordered today are listed in the Patient Instructions below. Patient Instructions  Medication Instructions:  1) INCREASE Furosemide to 80mg  twice daily 2) START Potassium 20 mEq twice daily  Labwork: BMET in 2 days  Testing/Procedures: None  Follow-Up: Your physician recommends that you schedule a follow-up appointment in: 1 week with PA or NP. Plan to repeat BMET at this time.    Any Other Special Instructions Will Be Listed Below (If Applicable).     If you need a refill on your cardiac medications before your next appointment, please call your pharmacy.  1)    Signed, Sinclair Grooms, MD  07/19/2016 12:56 PM    [facility] [address], [place], [place]  [address] Phone: [phone]; Fax: [phone]

## 2016-07-19 NOTE — Patient Instructions (Addendum)
Medication Instructions:  1) INCREASE Furosemide to 80mg  twice daily 2) START Potassium 20 mEq twice daily  Labwork: BMET in 2 days  Testing/Procedures: None  Follow-Up: Your physician recommends that you schedule a follow-up appointment in: 1 week with PA or NP. Plan to repeat BMET at this time.    Any Other Special Instructions Will Be Listed Below (If Applicable).     If you need a refill on your cardiac medications before your next appointment, please call your pharmacy.  1)

## 2016-07-21 ENCOUNTER — Non-Acute Institutional Stay (SKILLED_NURSING_FACILITY): Payer: Medicare Other | Admitting: Internal Medicine

## 2016-07-21 ENCOUNTER — Encounter: Payer: Self-pay | Admitting: Internal Medicine

## 2016-07-21 DIAGNOSIS — R21 Rash and other nonspecific skin eruption: Secondary | ICD-10-CM | POA: Diagnosis not present

## 2016-07-21 DIAGNOSIS — N485 Ulcer of penis: Secondary | ICD-10-CM | POA: Diagnosis not present

## 2016-07-21 NOTE — Progress Notes (Signed)
Location:  [facility] Room Number: [phone] Place of Service:  SNF [phone])  [name]. [name] Coil, MD  Patient Care Team: Hulan Fess, MD as PCP - General (Family Medicine) Belva Crome, MD (Cardiology) Laurence Spates, MD (Gastroenterology) Heath Lark, MD as Consulting Physician (Hematology and Oncology) Patsey Berthold, NP as Nurse Practitioner (Cardiology) Cleon Gustin, MD as Consulting Physician (Urology) Greer Pickerel, MD as Consulting Physician (General Surgery)  Extended Emergency Contact Information Primary Emergency Contact: [name] Address: [address]          [place], [place] [address] [place] Phone: [phone] Mobile Phone: [phone] Relation: Spouse Secondary Emergency Contact: [name] Address: [address] [place], [place] [address] [place] of [place] Phone: [phone] Mobile Phone: [phone] Relation: Son    Allergies: Nsaids; Ibuprofen; and Ace inhibitors  Chief Complaint  Patient presents with  . Acute Visit    Acute    HPI: Patient is 81 y.o. male who is being seen today to discuss a rash on pt's penis that the wound care nurse has seen. Pt is waring a foley that may be reomved tomorrow as he has a Urology appointment. Pt admits the rash hurts but no urinary sx, no flank pain. Last sexual contact years ago. Denies penile d/c.No fever. [name] said he had one lesion yesterday and today he has 5.  Past Medical History:  Diagnosis Date  . Angiomyolipoma 2009   On both kidneys noted in 2009  . Arthritis    neck and left wrist  . Atrial fibrillation ([place])   . CAD (coronary artery disease)    s/b CABG 1994, and subsequent stents. Repeat CABG 12/2011,  . Chronic diastolic heart failure ([place])   . CKD (chronic kidney disease)   . Clostridium difficile diarrhea 03/2016  . Depressive disorder   . Diabetes mellitus type 2 with peripheral artery disease (HCC)    DIET  CONTROLLED  . DVT (deep venous thrombosis) ([place]) 2011   Right arm  . Gastroparesis   . GERD (gastroesophageal reflux disease)   . Gout   . History of hiatal hernia   . Hyperlipidemia   . Hypertension   . Internal hemorrhoids without mention of complication   . Ischemic colitis ([place])   . 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)   . Myelodysplastic syndrome ([place]) 05/22/2013   With low hemoglobin and platelets treated with Procrit  . Osteopenia   . Peptic ulcer    S/p partial gastrectomy in 1969  . Peripheral artery disease ([place])   . Pneumonia 01/16/2016  . Pneumonia 03/2016  . Presence of permanent cardiac pacemaker   . Prostate cancer ([place]) 1997   XRT and lupron  . Renal artery stenosis ([place])   . Sick sinus syndrome ([place])   . Vitamin B 12 deficiency     Past Surgical History:  Procedure Laterality Date  . ABDOMINAL ANGIOGRAM N/A 05/25/2011   Procedure: ABDOMINAL ANGIOGRAM;  Surgeon: Serafina Mitchell, MD;  Location: [facility] CATH LAB;  Service: Cardiovascular;  Laterality: N/A;  . ABDOMINAL ANGIOGRAM N/A 02/13/2013   Procedure: ABDOMINAL ANGIOGRAM;  Surgeon: Serafina Mitchell, MD;  Location: [facility] CATH LAB;  Service: Cardiovascular;  Laterality: N/A;  . APPENDECTOMY  1991  . CELIAC ARTERY ANGIOPLASTY  05-16-12   and stenting  . CHOLECYSTECTOMY  Oct 2009   Laparoscopic  . CORONARY ARTERY BYPASS GRAFT  01/22/1993  .  CORONARY ARTERY BYPASS GRAFT  01/03/2012   Procedure: REDO CORONARY ARTERY BYPASS GRAFTING (CABG);  Surgeon: Gaye Pollack, MD;  Location: [facility];  Service: Open Heart Surgery;  Laterality: N/A;  Redo CABG x  using bilateral internal mammary arteries;  left leg greater saphenous vein harvested endoscopically  . FOREIGN BODY REMOVAL ABDOMINAL Right 01/27/2016   Procedure: EXPOSURE OF RIGHT COMMON FEMORAL ARTERY AND REMOVAL FOREIGN;  Surgeon: Evans Lance, MD;  Location: [facility];  Service: Cardiovascular;  Laterality: Right;  . HIATAL HERNIA REPAIR     and ulcer repair  . I&D EXTREMITY Left 01/21/2016    Procedure: IRRIGATION AND DEBRIDEMENT EXTREMITY;  Surgeon: Milly Jakob, MD;  Location: [facility];  Service: Orthopedics;  Laterality: Left;  . INGUINAL HERNIA REPAIR Right 10/28/2015   Procedure: OPEN RIGHT INGUINAL HERNIA REPAIR;  Surgeon: Greer Pickerel, MD;  Location: [facility];  Service: General;  Laterality: Right;  . INSERTION OF MESH Right 10/28/2015   Procedure: INSERTION OF MESH;  Surgeon: Greer Pickerel, MD;  [facility];  Service: General;  Laterality: Right;  . IR GENERIC HISTORICAL  02/02/2016   IR FLUORO GUIDE CV LINE LEFT 02/02/2016 Markus Daft, MD [facility]  . IR GENERIC HISTORICAL  02/02/2016   IR US GUIDE VASC ACCESS LEFT 02/02/2016 Markus Daft, MD [facility]  . IR GENERIC HISTORICAL  03/31/2016   IR REMOVAL TUN CV CATH W/O FL 03/31/2016 Arne Cleveland, MD [facility]  . LEFT HEART CATHETERIZATION WITH CORONARY/GRAFT ANGIOGRAM  12/24/2011   Procedure: LEFT HEART CATHETERIZATION WITH Beatrix Fetters;  Surgeon: Sinclair Grooms, MD;  Location: [facility] CATH LAB;  Service: Cardiovascular;;  . LOWER EXTREMITY ANGIOGRAM Bilateral 05/25/2011   Procedure: LOWER EXTREMITY ANGIOGRAM;  Surgeon: Serafina Mitchell, MD;  Location: [facility] CATH LAB;  Service: Cardiovascular;  Laterality: Bilateral;  . OTHER SURGICAL HISTORY  02/13/13   superior mesenteric artery angiogram  . OTHER SURGICAL HISTORY  05/16/12   Stent in stomach  . PACEMAKER GENERATOR CHANGE  12/10/2003   SJM Identity XL DR performed by Dr Leonia Reeves  . PACEMAKER INSERTION  10/18/1994   DDD pacemaker, [employer]. Gen change 12/10/2003.  Marland Kitchen PACEMAKER LEAD REMOVAL Right 01/27/2016   Procedure: PACEMAKER EXTRACTION;  Surgeon: Evans Lance, MD;  Location: [facility];  Service: Cardiovascular;  Laterality: Right;  DR. Roxy Manns TO BACKUP CASE  . PARTIAL GASTRECTOMY  1969   Hx of ulcer s/p partial gastrectomy/ has pernicious anemia  . RENAL ANGIOGRAM N/A 02/13/2013   Procedure: RENAL ANGIOGRAM;  Surgeon: Serafina Mitchell, MD;  Location: [facility] CATH LAB;  Service:  Cardiovascular;  Laterality: N/A;  . TEE WITHOUT CARDIOVERSION N/A 01/26/2016   Procedure: TRANSESOPHAGEAL ECHOCARDIOGRAM (TEE);  Surgeon: Sueanne Margarita, MD;  Location: [facility] ENDOSCOPY;  Service: Cardiovascular;  Laterality: N/A;  . TEE WITHOUT CARDIOVERSION N/A 01/27/2016   Procedure: TRANSESOPHAGEAL ECHOCARDIOGRAM (TEE);  Surgeon: Evans Lance, MD;  Location: [facility];  Service: Cardiovascular;  Laterality: N/A;  . TEE WITHOUT CARDIOVERSION N/A 04/12/2016   Procedure: TRANSESOPHAGEAL ECHOCARDIOGRAM (TEE);  Surgeon: Dorothy Spark, MD;  Location: [facility];  Service: Cardiovascular;  Laterality: N/A;  . THROMBECTOMY / EMBOLECTOMY SUBCLAVIAN ARTERY  02/02/10   Right subclavian thromboectomy and venous angioplasty, and chronic mesenteric ischemia with Herculink stenting to superior mesenteric and celiac arteries - Dr. Trula Slade  . TRANSURETHRAL RESECTION OF PROSTATE N/A 02/22/2016   Procedure: CYSTO, CLOT EVACUATION, FULGERATION;  Surgeon: Alexis Frock, MD;  [facility];  Service: Urology;  Laterality:  N/A;  . VISCERAL ANGIOGRAM N/A 05/25/2011   Procedure: VISCERAL ANGIOGRAM;  Surgeon: Serafina Mitchell, MD;  Location: [facility] CATH LAB;  Service: Cardiovascular;  Laterality: N/A;  . VISCERAL ANGIOGRAM Bilateral 12/28/2011   Procedure: VISCERAL ANGIOGRAM;  Surgeon: Serafina Mitchell, MD;  Location: [facility] CATH LAB;  Service: Cardiovascular;  Laterality: Bilateral;  . VISCERAL ANGIOGRAM N/A 05/16/2012   Procedure: VISCERAL ANGIOGRAM;  Surgeon: Serafina Mitchell, MD;  Location: [facility] CATH LAB;  Service: Cardiovascular;  Laterality: N/A;  . VISCERAL ANGIOGRAM N/A 02/13/2013   Procedure: VISCERAL ANGIOGRAM;  Surgeon: Serafina Mitchell, MD;  Location: [facility] CATH LAB;  Service: Cardiovascular;  Laterality: N/A;    Allergies as of 07/21/2016      Reactions   Nsaids Nausea Only   Reaction:  GI upset    Ibuprofen Other (See Comments)   Reaction:  GI upset    Ace Inhibitors Cough      Medication List       Accurate as  of 07/21/16  3:30 PM. Always use your most recent med list.          acetaminophen 650 MG CR tablet Commonly known as:  TYLENOL Take 650 mg by mouth 2 (two) times daily.   amiodarone 100 MG tablet Commonly known as:  PACERONE Take 100 mg by mouth daily.   amLODipine 5 MG tablet Commonly known as:  NORVASC Take 1 tablet (5 mg total) by mouth daily.   atorvastatin 80 MG tablet Commonly known as:  LIPITOR Take 80 mg by mouth at bedtime.   calcium carbonate 1500 (600 Ca) MG Tabs tablet Commonly known as:  OSCAL Take 600 mg of elemental calcium by mouth daily with breakfast.   carvedilol 25 MG tablet Commonly known as:  COREG Take 1 tablet (25 mg total) by mouth 2 (two) times daily with a meal.   cyanocobalamin 1000 MCG/ML injection Commonly known as:  (VITAMIN B-12) Inject 1,000 mcg into the muscle every 30 (thirty) days.   epoetin alfa 10000 UNIT/ML injection Commonly known as:  EPOGEN,PROCRIT Inject 10,000 Units into the skin every 14 (fourteen) days.   feeding supplement (GLUCERNA 1.2 CAL) Liqd Take 237 mLs by mouth 2 (two) times daily as needed (for nutritional supplement).   feeding supplement (GLUCERNA SHAKE) Liqd Take 237 mLs by mouth 2 (two) times daily between meals. To support nutritional status   feeding supplement (PRO-STAT SUGAR FREE 64) Liqd Take 30 mLs by mouth 2 (two) times daily with a meal.   ferrous sulfate 325 (65 FE) MG tablet Take 1 tablet (325 mg total) by mouth 3 (three) times daily with meals.   furosemide 80 MG tablet Commonly known as:  LASIX Take 1 tablet (80 mg total) by mouth 2 (two) times daily.   hydrALAZINE 25 MG tablet Commonly known as:  APRESOLINE Take 3 tablets (75 mg total) by mouth 4 (four) times daily.   isosorbide mononitrate 120 MG 24 hr tablet Commonly known as:  IMDUR Take 120 mg by mouth daily.   leuprolide 30 MG injection Commonly known as:  LUPRON Inject 30 mg into the muscle every 4 (four) months.     levothyroxine 50 MCG tablet Commonly known as:  SYNTHROID Take 1 tablet (50 mcg total) by mouth daily before breakfast.   multivitamin with minerals Tabs tablet Take 1 tablet by mouth daily.   nystatin powder Generic drug:  nystatin Apply 1 g topically 2 (two) times daily. To groin area for candida   silodosin 8  MG Caps capsule Commonly known as:  RAPAFLO Take 8 mg by mouth at bedtime.   warfarin 6 MG tablet Commonly known as:  COUMADIN Take one (1) tablets (6 mg total) by mouth daily on Sundays.   warfarin 5 MG tablet Commonly known as:  COUMADIN Take one (1) tablet (5 mg total) by mouth daily Monday to Saturday.       Meds ordered this encounter  Medications  . feeding supplement, GLUCERNA SHAKE, (GLUCERNA SHAKE) LIQD    Sig: Take 237 mLs by mouth 2 (two) times daily between meals. To support nutritional status    Immunization History  Administered Date(s) Administered  . Influenza Split 03/29/2015  . Influenza,inj,Quad PF,36+ Mos 03/20/2013  . PPD Test 01/16/2016    Social History  Substance Use Topics  . Smoking status: Former Smoker    Years: 20.00    Types: Cigarettes    Quit date: 07/12/1969  . Smokeless tobacco: Never Used  . Alcohol use No    Review of Systems  DATA OBTAINED: from patient, nurse GENERAL:  no fevers, fatigue, appetite changes SKIN:rash on glans anf circumcised foreskin HEENT: No complaint RESPIRATORY: No cough, wheezing, SOB CARDIAC: No chest pain, palpitations, lower extremity edema  GI: No abdominal pain, No N/V/D or constipation, No heartburn or reflux  GU: No dysuria, frequency or urgency, or incontinence  MUSCULOSKELETAL: No unrelieved bone/joint pain NEUROLOGIC: No headache, dizziness  PSYCHIATRIC: No overt anxiety or sadness  Vitals:   07/21/16 1529  BP: (!) 178/70  Pulse: 71  Resp: (!) 22  Temp: 99.7 F (37.6 C)   Body mass index is 24.97 kg/m. Physical Exam  GENERAL APPEARANCE: Alert, conversant, No acute  distress  SKIN: No diaphoresis rash HEENT: Unremarkable RESPIRATORY: Breathing is even, unlabored. Lung sounds are clear   CARDIOVASCULAR: Heart RRR no murmurs, rubs or gallops. No peripheral edema  GASTROINTESTINAL: Abdomen is soft, non-tender, not distended w/ normal bowel sounds.  GENITOURINARY: Bladder non tender, not distended  PENIS - there is an ulcer/tissue deficit in glans where foley is; on cufff of circumcised foreskin are approx 5 open lesions circumferentialy; they do not look like herpes MUSCULOSKELETAL: No abnormal joints or musculature NEUROLOGIC: Cranial nerves 2-12 grossly intact. Moves all extremities PSYCHIATRIC: Mood and affect appropriate to situation, no behavioral issues  Patient Active Problem List   Diagnosis Date Noted  . Hyperlipidemia 07/06/2016  . BPH (benign prostatic hyperplasia) 07/06/2016  . Acute back pain 06/24/2016  . Acute on chronic diastolic CHF (congestive heart failure) ([place]) 06/24/2016  . Debility 06/24/2016  . Leg weakness, bilateral 06/24/2016  . Pseudomonas urinary tract infection 06/24/2016  . Fall 06/24/2016  . Warfarin anticoagulation 06/24/2016  . Encounter for therapeutic drug monitoring 06/07/2016  . Pressure injury of skin 05/27/2016  . Acute deep vein thrombosis (DVT) of right upper extremity ([place]) 05/25/2016  . History of pacemaker 05/03/2016  . Anemia, iron deficiency 04/16/2016  . Endocarditis of tricuspid valve   . C. difficile colitis 04/10/2016  . HCAP (healthcare-associated pneumonia) 04/08/2016  . Chronic kidney disease, stage III (moderate) 03/09/2016  . Pressure ulcer 02/23/2016  . Seizure ([place]) 02/20/2016  . Palliative care encounter   . Goals of care, counseling/discussion   . Pacemaker infection ([place])   . Acute renal failure superimposed on stage 4 chronic kidney disease ([place]) 01/26/2016  . Bacteremia due to Staphylococcus aureus   . Pseudogout involving multiple joints 01/24/2016  . Bladder mass 01/24/2016  .  Hemoptysis   . Pain   .  Left upper extremity swelling   . Staphylococcus aureus bacteremia 01/17/2016  . CAP (community acquired pneumonia) 01/16/2016  . S/P hernia repair 10/28/2015  . Right inguinal hernia 09/09/2015  . GIB (gastrointestinal bleeding) 08/29/2015  . Hematuria 05/19/2015  . Thrombocytopenia ([place]) 11/11/2014  . Leukopenia 11/11/2014  . B12 deficiency 08/31/2013  . Pacemaker 08/26/2013  . On amiodarone therapy 08/21/2013  . Gout due to renal impairment, left ankle and foot 06/13/2013    Class: Acute  . CAD of autologous bypass graft 06/05/2013  . MDS (myelodysplastic syndrome) ([place]) 05/22/2013  . Anemia of chronic disease 03/19/2013  . CKD (chronic kidney disease), stage IV ([place]) 03/18/2013  . NSVT (nonsustained ventricular tachycardia) ([place]) 03/18/2013  . Chronic diastolic heart failure ([place]) 03/17/2013  . Diabetes mellitus ([place]) 03/17/2013  . Essential hypertension 03/17/2013  . Mesenteric artery stenosis ([place]) 05/01/2012  . PAF (paroxysmal atrial fibrillation) ([place]) 12/24/2011  . PAD (peripheral artery disease) ([place]) 12/24/2011  . Chronic vascular insufficiency of intestine (HCC) 12/20/2011  . Personal history of DVT (deep vein thrombosis) 07/12/2009    CMP     Component Value Date/Time   NA 141 07/06/2016   NA 146 (H) 08/10/2013 1324   K 3.9 07/06/2016   K 3.9 08/10/2013 1324   CL 107 06/30/2016 0520   CO2 21 (L) 06/30/2016 0520   CO2 27 08/10/2013 1324   GLUCOSE 112 (H) 06/30/2016 0520   GLUCOSE 105 08/10/2013 1324   BUN 53 (A) 07/06/2016   BUN 40.3 (H) 08/10/2013 1324   CREATININE 2.7 (A) 07/06/2016   CREATININE 2.82 (H) 06/30/2016 0520   CREATININE 3.05 (H) 06/18/2016 1627   CREATININE 2.6 (H) 08/10/2013 1324   CALCIUM 8.3 (L) 06/30/2016 0520   CALCIUM 9.4 08/10/2013 1324   PROT 8.2 (H) 06/24/2016 1556   PROT 7.0 08/10/2013 1324   ALBUMIN 2.9 (L) 06/24/2016 1556   ALBUMIN 3.8 08/10/2013 1324   AST 19 06/24/2016 1556   AST 14 08/10/2013  1324   ALT 9 (L) 06/24/2016 1556   ALT 8 08/10/2013 1324   ALKPHOS 52 06/24/2016 1556   ALKPHOS 47 08/10/2013 1324   BILITOT 0.5 06/24/2016 1556   BILITOT 0.33 08/10/2013 1324   GFRNONAA 20 (L) 06/30/2016 0520   GFRAA 23 (L) 06/30/2016 0520    Recent Labs  02/01/16 0450 02/02/16 0553 02/02/16 0554 02/03/16 0601  06/24/16 1556  06/28/16 0401 06/29/16 0455 06/30/16 0520 07/06/16  NA 139 139  --  139  < > 135*  135  < > 135*  135 137  137 136*  136 141  K 3.3* 3.3*  --  3.8  < > 3.8  3.8  < > 3.5  3.5 3.6  3.6 3.3* 3.9  CL 110 109  --  109  < > 103  < > 105 106 107  --   CO2 21* 23  --  22  < > 23  < > 21* 23 21*  --   GLUCOSE 97 145*  --  118*  < > 131*  < > 145* 111* 112*  --   BUN 89* 82*  --  76*  < > 53*  53*  < > 57*  57* 56*  56* 53*  53* 53*  CREATININE 4.04* 3.73*  --  3.61*  < > 2.79*  2.8*  < > 2.93*  2.9* 3.06*  3.1* 2.82*  2.8* 2.7*  CALCIUM 8.2* 8.1*  --  8.2*  < > 8.8*  < >  8.1* 8.2* 8.3*  --   MG  --   --  1.8  --   --  1.9  --   --   --   --   --   PHOS 3.9 3.5  --  4.1  --   --   --   --   --   --   --   < > = values in this interval not displayed.  Recent Labs  04/09/16 0530 06/18/16 1627 06/24/16 1556  AST 18 26  26 19   ALT 8* 8*  8* 9*  ALKPHOS 47 40  40 52  BILITOT 0.5 0.3 0.5  PROT 6.3* 6.5 8.2*  ALBUMIN 2.7* 2.6* 2.9*    Recent Labs  06/21/16 1124 06/24/16 1556  06/29/16 0455 06/30/16 0520 07/06/16 07/13/16 1036  WBC 5.0 12.0  12.0*  < > 17.8  17.8* 12.8  12.8* 9.5 7.2  NEUTROABS 3.6 9.5*  --   --   --   --  5.2  HGB 9.1* 10.8*  10.8*  < > 8.4*  8.4* 9.1* 8.5* 8.6*  HCT 29.0* 32.4*  32*  < > 25.6*  26* 27.5* 28* 27.2*  MCV 92.7 89.8  < > 88.9 88.1  --  92.5  PLT 184 221  221  < > 191  191 212 191 187  < > = values in this interval not displayed.  Recent Labs  03/30/16  CHOL 122  LDLCALC 58  TRIG 67   No results found for: [facility] Lab Results  Component Value Date   TSH 22.371 (H) 06/25/2016    Lab Results  Component Value Date   HGBA1C 4.9 02/20/2016   Lab Results  Component Value Date   CHOL 122 03/30/2016   HDL 51 03/30/2016   LDLCALC 58 03/30/2016   TRIG 67 03/30/2016   CHOLHDL 1.9 12/25/2011    Significant Diagnostic Results in last 30 days:  Ct Abdomen Pelvis Wo Contrast  Result Date: 06/26/2016 CLINICAL DATA:  Right back pain for the past 7 days. Urinary tract infection. Moderate right hydronephrosis on a renal ultrasound earlier today. EXAM: CT ABDOMEN AND PELVIS WITHOUT CONTRAST TECHNIQUE: Multidetector CT imaging of the abdomen and pelvis was performed following the standard protocol without IV contrast. COMPARISON:  Renal ultrasound earlier today. Abdomen and pelvis CT dated 04/08/2016. FINDINGS: Lower chest: Small bilateral pleural effusions. Mild bilateral lower lobe atelectasis. Hepatobiliary: Liver cysts.  Cholecystectomy clips. Pancreas: Unremarkable. No pancreatic ductal dilatation or surrounding inflammatory changes. Spleen: Normal in size without focal abnormality. Adrenals/Urinary Tract: Moderate dilatation of the right renal collecting system. Tiny upper pole right renal calculus or vascular calcification. No bladder or ureteral calculi a seen. Unremarkable left kidney. Grossly normal adrenal glands. Multiple bladder diverticula. Stomach/Bowel: Moderate diffuse low density wall thickening involving the colon, including the rectum. Grossly unremarkable stomach and small bowel. Vascular/Lymphatic: Extensive, dense, diffuse arterial calcifications, including the coronary arteries and the abdominal aorta and its branches. No enlarged lymph nodes. Reproductive: Normal sized prostate containing coarse calcifications. Other: The examination is limited by streak artifacts produced by an electronic device on the left side of the patient and limited by a diffuse edema of the subcutaneous, intraperitoneal and retroperitoneal fat. Musculoskeletal: Mild lower lumbar spine  degenerative changes. IMPRESSION: 1. Interval diffuse colitis and proctitis. 2. Interval moderate right hydronephrosis with no visible cause. 3. Severe diffuse atheromatous arterial calcifications, including coronary artery atherosclerosis and aortic atherosclerosis. 4. Progressive diffuse anasarca. 5. Small bilateral pleural effusions. 6.  Mild bilateral lower lobe atelectasis. 7. Multiple bladder diverticula. Electronically Signed   By: Claudie Revering M.D.   On: 06/26/2016 18:48   Dg Chest 2 View  Result Date: 06/24/2016 CLINICAL DATA:  Weakness, recent endocarditis EXAM: CHEST  2 VIEW COMPARISON:  05/25/2016 FINDINGS: Cardiomediastinal silhouette is stable. Status post CABG. Atherosclerotic calcifications of thoracic aorta. Bilateral small pleural effusion. Streaky bilateral basilar atelectasis or infiltrate right greater than left. No pulmonary edema. IMPRESSION: Status post CABG. Atherosclerotic calcifications of thoracic aorta. Bilateral small pleural effusion. Streaky bilateral basilar atelectasis or infiltrate right greater than left. No pulmonary edema. Electronically Signed   By: Lahoma Crocker M.D.   On: 06/24/2016 15:05   Dg Ribs Unilateral Right  Result Date: 06/24/2016 CLINICAL DATA:  81 year old male with fall and a right posterior rib pain. EXAM: RIGHT RIBS - 2 VIEW COMPARISON:  Chest radiograph dated 06/24/2016 and chest radiograph dated 04/08/2016 FINDINGS: There is an age indeterminate nondisplaced fracture of the posterolateral aspect of the right ninth rib. The bones are osteopenic which limits evaluation for fracture. No other fracture identified. There is a small right pleural effusion. Right lung base opacity may represent atelectatic changes versus infiltrate. Trace fluid noted in the minor fissure. There is cardiomegaly. Median sternotomy wires and CABG surgical clips noted. There is atherosclerotic calcification of the aorta. There is degenerative changes of the spine. Right upper  quadrant cholecystectomy clips noted. IMPRESSION: Nondisplaced fracture of the posterior aspect of the right ninth rib, age indeterminate. No pneumothorax. Small right pleural effusion with right lung base atelectasis versus infiltrate. Electronically Signed   By: Anner Crete M.D.   On: 06/24/2016 23:28   Mr Brain Wo Contrast  Result Date: 06/24/2016 CLINICAL DATA:  LEFT lower extremity weakness. History of prostate cancer, diabetes. EXAM: MRI HEAD WITHOUT CONTRAST TECHNIQUE: Multiplanar, multiecho pulse sequences of the brain and surrounding structures were obtained without intravenous contrast. COMPARISON:  CT HEAD February 20, 2016 FINDINGS: BRAIN: No reduced diffusion to suggest acute ischemia or hypercellular tumor. Punctate chronic microhemorrhage LEFT parietal lobe. The ventricles and sulci are normal for patient's age. Patchy to confluent supratentorial white matter FLAIR T2 hyperintensities. No suspicious parenchymal signal, masses or mass effect. Multiple small cortical infarcts. Prominent symmetric perivascular spaces, normal variant. No abnormal extra-axial fluid collections. No extra-axial masses though, contrast enhanced sequences would be more sensitive. VASCULAR: Normal major intracranial vascular flow voids present at skull base. SKULL AND UPPER CERVICAL SPINE: No abnormal sellar expansion. No suspicious calvarial bone marrow signal. Craniocervical junction maintained. Trace atlantodental fusion. SINUSES/ORBITS: Lobulated LEFT maxillary sinus mucosal thickening on, trace remaining paranasal sinus mucosal thickening. Trace mastoid effusions. The included ocular globes and orbital contents are non-suspicious. OTHER: None. IMPRESSION: No acute intracranial process. Involutional changes. Moderate chronic small vessel ischemic disease. Electronically Signed   By: Elon Alas M.D.   On: 06/24/2016 21:16   Mr Lumbar Spine Wo Contrast  Result Date: 06/25/2016 CLINICAL DATA:  81 year old  male with generalized weakness for 3 days. Lumbar back pain after a fall. Leg weakness. Initial encounter. EXAM: MRI LUMBAR SPINE WITHOUT CONTRAST TECHNIQUE: Multiplanar, multisequence MR imaging of the lumbar spine was performed. No intravenous contrast was administered. COMPARISON:  CT Abdomen and Pelvis 04/08/2016 FINDINGS: MRI imaging quality of the lumbar spine is significantly degraded by generalize loss of signal despite repeated imaging attempts. The etiology of the signal loss seems to be generalized soft tissue edema such as anasarca. Segmentation: Normal as seen on the comparison CT Abdomen and Pelvis.  Alignment: Stable since the prior CT. Mild grade 1 anterolisthesis of L5 on S1. Mild dextroconvex lumbar scoliosis. Vertebrae: Suboptimal characterization of marrow due to the decreased signal to noise. No definite marrow edema or acute osseous abnormality. Conus medullaris: Extends to the L1 level and appears normal. Paraspinal and other soft tissues: Diffuse soft tissue edema suspected such is anasarca. This also seems to affect the visible paraspinal muscles bilaterally. Very limited visualization of abdominal viscera. The visualized kidneys appear stable to the prior CT. Disc levels: No significant lumbar spinal stenosis. Mild lumbar spine degeneration above L4. At L4-L5 there is disc bulging and moderate to severe facet and ligament flavum hypertrophy. Both facet joints are capacious and contain fluid. Small posteriorly situated synovial cyst on the left. No significant stenosis. Chronic mild anterolisthesis at L5-S1 with circumferential disc bulge and moderate to severe facet hypertrophy. No spinal stenosis. Up to mild lateral recess stenosis and bilateral L5 foraminal stenosis. IMPRESSION: 1. MRI of the lumbar spine significantly degraded by signal loss despite repeated imaging attempts, which seems to be due to generalized body soft tissue edema such as due to anasarca. 2. Given #1, diffuse lumbar  Myositis is difficult to exclude. No acute osseous abnormality identified. 3. No significant lumbar spinal stenosis. Spinal degeneration primarily only at L4-L5 and L5-S1 where there is facet arthropathy in part related to mild spondylolisthesis. Subsequent up to mild multifactorial lateral recess and foraminal stenosis. Electronically Signed   By: Genevie Ann M.D.   On: 06/25/2016 10:29   US Renal  Result Date: 06/30/2016 CLINICAL DATA:  Hydronephrosis EXAM: RENAL / URINARY TRACT ULTRASOUND COMPLETE COMPARISON:  06/26/2016 FINDINGS: Right Kidney: Length: 11.7 cm. Diffusely increased parenchymal echogenicity is stable. Cyst measuring 1.4 cm is stable. Hydronephrosis has resolved. Left Kidney: Length: 10.0 cm. Increased echogenicity throughout the parenchyma. No hydronephrosis or mass. Bladder: Foley catheter decompresses the bladder. Additional findings: There is a small amount of ascites about the liver. IMPRESSION: Right hydronephrosis has resolved. Foley catheter decompresses the bladder. Small amount of ascites. Electronically Signed   By: Marybelle Killings M.D.   On: 06/30/2016 09:44   US Renal  Result Date: 06/26/2016 CLINICAL DATA:  Acute kidney failure EXAM: RENAL / URINARY TRACT ULTRASOUND COMPLETE COMPARISON:  None. FINDINGS: Right Kidney: Length: 10.6 cm. 1.6 cm cyst in the upper pole. Moderate hydronephrosis. Diffusely increased echotexture throughout the right kidney. Left Kidney: Length: 10.1 cm. Increased echotexture throughout the left kidney. No hydronephrosis or mass. Bladder: Layering debris dependently in the bladder. Mild bladder wall irregularity and thickening. IMPRESSION: Moderate right hydronephrosis. Increased echotexture within the kidneys bilaterally compatible with chronic medical renal disease. Irregular wall thickening throughout the bladder. While this could be related to bladder outlet obstruction, I cannot exclude infection or neoplasm. Recommend clinical correlation.  Electronically Signed   By: Rolm Baptise M.D.   On: 06/26/2016 10:50    Assessment and Plan  PENILE RASH AND GLANS ULCER - does not look like herpes, does not look infected; will use barrier creams, there really is nothing else; pt sees urology tomorrow so look forward to their ideas    Time spent > 25 min Pollie Poma D. [name] Coil, MD

## 2016-07-23 ENCOUNTER — Non-Acute Institutional Stay (SKILLED_NURSING_FACILITY): Payer: Medicare Other | Admitting: Internal Medicine

## 2016-07-23 ENCOUNTER — Encounter: Payer: Self-pay | Admitting: Internal Medicine

## 2016-07-23 DIAGNOSIS — R509 Fever, unspecified: Secondary | ICD-10-CM

## 2016-07-23 DIAGNOSIS — M10062 Idiopathic gout, left knee: Secondary | ICD-10-CM | POA: Diagnosis not present

## 2016-07-23 NOTE — Progress Notes (Signed)
Location:  [place] Room Number: [phone] Place of Service:  SNF [phone])  Noah Delaine. [name], MD  Patient Care Team: Hulan Fess, MD as PCP - General (Family Medicine) Belva Crome, MD (Cardiology) Laurence Spates, MD (Gastroenterology) Heath Lark, MD as Consulting Physician (Hematology and Oncology) Patsey Berthold, NP as Nurse Practitioner (Cardiology) Cleon Gustin, MD as Consulting Physician (Urology) Greer Pickerel, MD as Consulting Physician (General Surgery)  Extended Emergency Contact Information Primary Emergency Contact: Lorelee Market Address: [address]          [place], [place] [address] [place] Phone: [phone] Mobile Phone: [phone] Relation: Spouse Secondary Emergency Contact: Denis,Charles Address: [address] [place], [place] [address] [place] Phone: [phone] Mobile Phone: [phone] Relation: Son    Allergies: Nsaids; Ibuprofen; and Ace inhibitors  Chief Complaint  Patient presents with  . Acute Visit    Acute    HPI: Patient is 81 y.o. male who is being seen today for a fever 100.4 and not feeling well. Pt denies colds or coughs, urinary sx ,muscle aches , n/v/d. His only c/o is l knee pain and swelling that is worsew ith moveent.  Past Medical History:  Diagnosis Date  . Angiomyolipoma 2009   On both kidneys noted in 2009  . Arthritis    neck and left wrist  . Atrial fibrillation ([place])   . CAD (coronary artery disease)    s/b CABG 1994, and subsequent stents. Repeat CABG 12/2011,  . Chronic diastolic heart failure ([place])   . CKD (chronic kidney disease)   . Clostridium difficile diarrhea 03/2016  . Depressive disorder   . Diabetes mellitus type 2 with peripheral artery disease (HCC)    DIET CONTROLLED  . DVT (deep venous thrombosis) ([place]) 2011   Right arm  . Gastroparesis   . GERD (gastroesophageal reflux disease)   . Gout   . History of  hiatal hernia   . Hyperlipidemia   . Hypertension   . Internal hemorrhoids without mention of complication   . Ischemic colitis ([place])   . 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 Myelodysplastic syndrome ([place]) 05/22/2013   With low hemoglobin and platelets treated with Procrit  . Osteopenia   . Peptic ulcer    S/p partial gastrectomy in 1969  . Peripheral artery disease ([place])   . Pneumonia 01/16/2016  . Pneumonia 03/2016  . Presence of permanent cardiac pacemaker   . Prostate cancer ([place]) 1997   XRT and lupron  . Renal artery stenosis ([place])   . Sick sinus syndrome ([place])   . Vitamin B 12 deficiency     Past Surgical History:  Procedure Laterality Date  . ABDOMINAL ANGIOGRAM N/A 05/25/2011   Procedure: ABDOMINAL ANGIOGRAM;  Surgeon: Serafina Mitchell, MD;  Location: [facility] CATH LAB;  Service: Cardiovascular;  Laterality: N/A;  . ABDOMINAL ANGIOGRAM N/A 02/13/2013   Procedure: ABDOMINAL ANGIOGRAM;  Surgeon: Serafina Mitchell, MD;  Location: [facility] CATH LAB;  Service: Cardiovascular;  Laterality: N/A;  . APPENDECTOMY  1991  . CELIAC ARTERY ANGIOPLASTY  05-16-12   and stenting  . CHOLECYSTECTOMY  Oct 2009   Laparoscopic  . CORONARY ARTERY BYPASS GRAFT  01/22/1993  . CORONARY ARTERY BYPASS GRAFT  01/03/2012   Procedure: REDO CORONARY ARTERY BYPASS GRAFTING (CABG);  Surgeon: Gaye Pollack, MD;  Location: [employer];  Service: Open Heart Surgery;  Laterality: N/A;  Redo CABG x  using bilateral internal mammary arteries;  left leg greater saphenous vein harvested endoscopically  . FOREIGN BODY REMOVAL ABDOMINAL Right 01/27/2016   Procedure: EXPOSURE OF RIGHT COMMON FEMORAL ARTERY AND REMOVAL FOREIGN;  Surgeon: Evans Lance, MD;  Location: [place];  Service: Cardiovascular;  Laterality: Right;  . HIATAL HERNIA REPAIR     and ulcer repair  . I&D EXTREMITY Left 01/21/2016   Procedure: IRRIGATION AND DEBRIDEMENT EXTREMITY;  Surgeon: Milly Jakob, MD;  Location: [place];  Service: Orthopedics;  Laterality: Left;  . INGUINAL  HERNIA REPAIR Right 10/28/2015   Procedure: OPEN RIGHT INGUINAL HERNIA REPAIR;  Surgeon: Greer Pickerel, MD;  Location: [facility];  Service: General;  Laterality: Right;  . INSERTION OF MESH Right 10/28/2015   Procedure: INSERTION OF MESH;  Surgeon: Greer Pickerel, MD;  [facility]: [facility];  Service: General;  Laterality: Right;  . IR GENERIC HISTORICAL  02/02/2016   IR FLUORO GUIDE CV LINE LEFT 02/02/2016 Markus Daft, MD [facility]  . IR GENERIC HISTORICAL  02/02/2016   IR US GUIDE VASC ACCESS LEFT 02/02/2016 Markus Daft, MD [facility]  . IR GENERIC HISTORICAL  03/31/2016   IR REMOVAL TUN CV CATH W/O FL 03/31/2016 Arne Cleveland, MD [facility]  . LEFT HEART CATHETERIZATION WITH CORONARY/GRAFT ANGIOGRAM  12/24/2011   Procedure: LEFT HEART CATHETERIZATION WITH Beatrix Fetters;  Surgeon: Sinclair Grooms, MD;  Location: [facility] CATH LAB;  Service: Cardiovascular;;  . LOWER EXTREMITY ANGIOGRAM Bilateral 05/25/2011   Procedure: LOWER EXTREMITY ANGIOGRAM;  Surgeon: Serafina Mitchell, MD;  Location: [facility] CATH LAB;  Service: Cardiovascular;  Laterality: Bilateral;  . OTHER SURGICAL HISTORY  02/13/13   superior mesenteric artery angiogram  . OTHER SURGICAL HISTORY  05/16/12   Stent in stomach  . PACEMAKER GENERATOR CHANGE  12/10/2003   SJM Identity XL DR performed by Dr Leonia Reeves  . PACEMAKER INSERTION  10/18/1994   DDD pacemaker, [employer]. Gen change 12/10/2003.  Marland Kitchen PACEMAKER LEAD REMOVAL Right 01/27/2016   Procedure: PACEMAKER EXTRACTION;  Surgeon: Evans Lance, MD;  Location: [place];  Service: Cardiovascular;  Laterality: Right;  DR. Roxy Manns TO BACKUP CASE  . PARTIAL GASTRECTOMY  1969   Hx of ulcer s/p partial gastrectomy/ has pernicious anemia  . RENAL ANGIOGRAM N/A 02/13/2013   Procedure: RENAL ANGIOGRAM;  Surgeon: Serafina Mitchell, MD;  Location[facility] C[facility] LAB;  Service: Cardiovascular;  Laterality: N/A;  . TEE WITHOUT CARDIOVERSION N/A 01/26/2016   Procedure: TRANSESOPHAGEAL ECHOCARDIOGRAM (TEE);  Surgeon: Sueanne Margarita,  MD;  Location: [facility] ENDOSCOPY;  Service: Cardiovascular;  Laterality: N/A;  . TEE WITHOUT CARDIOVERSION N/A 01/27/2016   Procedure: TRANSESOPHAGEAL ECHOCARDIOGRAM (TEE);  Surgeon: Evans Lance, MD;  Location: [place];  Service: Cardiovascular;  Laterality: N/A;  . TEE WITHOUT CARDIOVERSION N/A 04/12/2016   Procedure: TRANSESOPHAGEAL ECHOCARDIOGRAM (TEE);  Surgeon: Dorothy Spark, MD;  Location: [place];  Service: Cardiovascular;  Laterality: N/A;  . THROMBECTOMY / EMBOLECTOMY SUBCLAVIAN ARTERY  02/02/10   Right subclavian thromboectomy and venous angioplasty, and chronic mesenteric ischemia with Herculink stenting to superior mesenteric and celiac arteries - Dr. Trula Slade  . TRANSURETHRAL RESECTION OF PROSTATE N/A 02/22/2016   Procedure: CYSTO, CLOT EVACUATION, FULGERATION;  Surgeon: Alexis Frock, MD;  [facility];  Service: Urology;  Laterality: N/A;  . VISCERAL ANGIOGRAM N/A 05/25/2011   Procedure: VISCERAL ANGIOGRAM;  Surgeon: Serafina Mitchell, MD;  Location: [facility] CATH LAB;  Service: Cardiovascular;  Laterality: N/A;  .  VISCERAL ANGIOGRAM Bilateral 12/28/2011   Procedure: VISCERAL ANGIOGRAM;  Surgeon: Serafina Mitchell, MD;  Location: [facility] CATH LAB;  Service: Cardiovascular;  Laterality: Bilateral;  . VISCERAL ANGIOGRAM N/A 05/16/2012   Procedure: VISCERAL ANGIOGRAM;  Surgeon: Serafina Mitchell, MD;  Location: [facility] CATH LAB;  Service: Cardiovascular;  Laterality: N/A;  . VISCERAL ANGIOGRAM N/A 02/13/2013   Procedure: VISCERAL ANGIOGRAM;  Surgeon: Serafina Mitchell, MD;  Location: [facility] CATH LAB;  Service: Cardiovascular;  Laterality: N/A;    Allergies as of 07/23/2016      Reactions   Nsaids Nausea Only   Reaction:  GI upset    Ibuprofen Other (See Comments)   Reaction:  GI upset    Ace Inhibitors Cough      Medication List       Accurate as of 07/23/16  1:51 PM. Always use your most recent med list.          acetaminophen 650 MG CR tablet Commonly known as:  TYLENOL Take 650 mg by mouth 2  (two) times daily.   amiodarone 100 MG tablet Commonly known as:  PACERONE Take 100 mg by mouth daily.   amLODipine 5 MG tablet Commonly known as:  NORVASC Take 1 tablet (5 mg total) by mouth daily.   atorvastatin 80 MG tablet Commonly known as:  LIPITOR Take 80 mg by mouth at bedtime.   calcium carbonate 1500 (600 Ca) MG Tabs tablet Commonly known as:  OSCAL Take 600 mg of elemental calcium by mouth daily with breakfast.   carvedilol 25 MG tablet Commonly known as:  COREG Take 1 tablet (25 mg total) by mouth 2 (two) times daily with a meal.   cyanocobalamin 1000 MCG/ML injection Commonly known as:  (VITAMIN B-12) Inject 1,000 mcg into the muscle every 30 (thirty) days.   epoetin alfa 10000 UNIT/ML injection Commonly known as:  EPOGEN,PROCRIT Inject 10,000 Units into the skin every 14 (fourteen) days.   feeding supplement (GLUCERNA 1.2 CAL) Liqd Take 237 mLs by mouth 2 (two) times daily as needed (for nutritional supplement).   feeding supplement (GLUCERNA SHAKE) Liqd Take 237 mLs by mouth 2 (two) times daily between meals. To support nutritional status   feeding supplement (PRO-STAT SUGAR FREE 64) Liqd Take 30 mLs by mouth 2 (two) times daily with a meal.   ferrous sulfate 325 (65 FE) MG tablet Take 1 tablet (325 mg total) by mouth 3 (three) times daily with meals.   furosemide 80 MG tablet Commonly known as:  LASIX Take 1 tablet (80 mg total) by mouth 2 (two) times daily.   hydrALAZINE 25 MG tablet Commonly known as:  APRESOLINE Take 3 tablets (75 mg total) by mouth 4 (four) times daily.   isosorbide mononitrate 120 MG 24 hr tablet Commonly known as:  IMDUR Take 120 mg by mouth daily.   leuprolide 30 MG injection Commonly known as:  LUPRON Inject 30 mg into the muscle every 4 (four) months.   levothyroxine 50 MCG tablet Commonly known as:  SYNTHROID Take 1 tablet (50 mcg total) by mouth daily before breakfast.   multivitamin with minerals Tabs  tablet Take 1 tablet by mouth daily.   nystatin powder Generic drug:  nystatin Apply 1 g topically 2 (two) times daily. To groin area for candida   silodosin 8 MG Caps capsule Commonly known as:  RAPAFLO Take 8 mg by mouth at bedtime.   warfarin 6 MG tablet Commonly known as:  COUMADIN Take one (1) tablets (6 mg  total) by mouth daily on Sundays.   warfarin 5 MG tablet Commonly known as:  COUMADIN Take one (1) tablet (5 mg total) by mouth daily Monday to Saturday.       No orders of the defined types were placed in this encounter.   Immunization History  Administered Date(s) Administered  . Influenza Split 03/29/2015  . Influenza,inj,Quad PF,36+ Mos 03/20/2013  . PPD Test 01/16/2016    Social History  Substance Use Topics  . Smoking status: Former Smoker    Years: 20.00    Types: Cigarettes    Quit date: 07/12/1969  . Smokeless tobacco: Never Used  . Alcohol use No    Review of Systems  DATA OBTAINED: from patient- as per HPI GENERAL:  + fever PER NURSING;L, fatigue, appetite changes SKIN: No itching, rash HEENT: No complaint RESPIRATORY: No cough, wheezing, SOB CARDIAC: No chest pain, palpitations, lower extremity edema  GI: No abdominal pain, No N/V/D or constipation, No heartburn or reflux  GU: No dysuria, frequency or urgency, or incontinence  MUSCULOSKELETAL: pain swelling L KNEE NEUROLOGIC: No headache, dizziness  PSYCHIATRIC: No overt anxiety or sadness  Vitals:   07/23/16 1349  BP: (!) 192/84  Pulse: 83  Resp: 20  Temp: 97.9 F (36.6 C)   Body mass index is 24.97 kg/m. Physical Exam  GENERAL APPEARANCE: Alert, conversant, No acute distress  SKIN: No diaphoresis rash HEENT: Unremarkable RESPIRATORY: Breathing is even, unlabored. Lung sounds are decreased L base, no rales, no wheezes   CARDIOVASCULAR: Heart RRR no murmurs, rubs or gallops. No peripheral edema  GASTROINTESTINAL: Abdomen is soft, non-tender, not distended w/ normal bowel  sounds.  GENITOURINARY: Bladder non tender, not distended  MUSCULOSKELETAL:L knee - small fluid, mild heat , no redness NEUROLOGIC: Cranial nerves 2-12 grossly intact. Moves all extremities PSYCHIATRIC: Mood and affect appropriate to situation, no behavioral issues  Patient Active Problem List   Diagnosis Date Noted  . Hyperlipidemia 07/06/2016  . BPH (benign prostatic hyperplasia) 07/06/2016  . Acute back pain 06/24/2016  . Acute on chronic diastolic CHF (congestive heart failure) ([place]) 06/24/2016  . Debility 06/24/2016  . Leg weakness, bilateral 06/24/2016  . Pseudomonas urinary tract infection 06/24/2016  . Fall 06/24/2016  . Warfarin anticoagulation 06/24/2016  . Encounter for therapeutic drug monitoring 06/07/2016  . Pressure injury of skin 05/27/2016  . Acute deep vein thrombosis (DVT) of right upper extremity ([place]) 05/25/2016  . History of pacemaker 05/03/2016  . Anemia, iron deficiency 04/16/2016  . Endocarditis of tricuspid valve   . C. difficile colitis 04/10/2016  . HCAP (healthcare-associated pneumonia) 04/08/2016  . Chronic kidney disease, stage III (moderate) 03/09/2016  . Pressure ulcer 02/23/2016  . Seizure ([place]) 02/20/2016  . Palliative care encounter   . Goals of care, counseling/discussion   . Pacemaker infection ([place])   . Acute renal failure superimposed on stage 4 chronic kidney disease ([place]) 01/26/2016  . Bacteremia due to Staphylococcus aureus   . Pseudogout involving multiple joints 01/24/2016  . Bladder mass 01/24/2016  . Hemoptysis   . Pain   . Left upper extremity swelling   . Staphylococcus aureus bacteremia 01/17/2016  . CAP (community acquired pneumonia) 01/16/2016  . S/P hernia repair 10/28/2015  . Right inguinal hernia 09/09/2015  . GIB (gastrointestinal bleeding) 08/29/2015  . Hematuria 05/19/2015  . Thrombocytopenia ([place]) 11/11/2014  . Leukopenia 11/11/2014  . B12 deficiency 08/31/2013  . Pacemaker 08/26/2013  . On amiodarone therapy  08/21/2013  . Gout due to renal impairment, left ankle  and foot 06/13/2013    Class: Acute  . CAD of autologous bypass graft 06/05/2013  . MDS (myelodysplastic syndrome) ([place]) 05/22/2013  . Anemia of chronic disease 03/19/2013  . CKD (chronic kidney disease), stage IV ([place]) 03/18/2013  . NSVT (nonsustained ventricular tachycardia) ([place]) 03/18/2013  . Chronic diastolic heart failure ([place]) 03/17/2013  . Diabetes mellitus ([place]) 03/17/2013  . Essential hypertension 03/17/2013  . Mesenteric artery stenosis ([place]) 05/01/2012  . PAF (paroxysmal atrial fibrillation) ([place]) 12/24/2011  . PAD (peripheral artery disease) ([place]) 12/24/2011  . Chronic vascular insufficiency of intestine (HCC) 12/20/2011  . Personal history of DVT (deep vein thrombosis) 07/12/2009    CMP     Component Value Date/Time   NA 141 07/06/2016   NA 146 (H) 08/10/2013 1324   K 3.9 07/06/2016   K 3.9 08/10/2013 1324   CL 107 06/30/2016 0520   CO2 21 (L) 06/30/2016 0520   CO2 27 08/10/2013 1324   GLUCOSE 112 (H) 06/30/2016 0520   GLUCOSE 105 08/10/2013 1324   BUN 53 (A) 07/06/2016   BUN 40.3 (H) 08/10/2013 1324   CREATININE 2.7 (A) 07/06/2016   CREATININE 2.82 (H) 06/30/2016 0520   CREATININE 3.05 (H) 06/18/2016 1627   CREATININE 2.6 (H) 08/10/2013 1324   CALCIUM 8.3 (L) 06/30/2016 0520   CALCIUM 9.4 08/10/2013 1324   PROT 8.2 (H) 06/24/2016 1556   PROT 7.0 08/10/2013 1324   ALBUMIN 2.9 (L) 06/24/2016 1556   ALBUMIN 3.8 08/10/2013 1324   AST 19 06/24/2016 1556   AST 14 08/10/2013 1324   ALT 9 (L) 06/24/2016 1556   ALT 8 08/10/2013 1324   ALKPHOS 52 06/24/2016 1556   ALKPHOS 47 08/10/2013 1324   BILITOT 0.5 06/24/2016 1556   BILITOT 0.33 08/10/2013 1324   GFRNONAA 20 (L) 06/30/2016 0520   GFRAA 23 (L) 06/30/2016 0520    Recent Labs  02/01/16 0450 02/02/16 0553 02/02/16 0554 02/03/16 0601  06/24/16 1556  06/28/16 0401 06/29/16 0455 06/30/16 0520 07/06/16  NA 139 139  --  139  < > 135*  135  < >  135*  135 137  137 136*  136 141  K 3.3* 3.3*  --  3.8  < > 3.8  3.8  < > 3.5  3.5 3.6  3.6 3.3* 3.9  CL 110 109  --  109  < > 103  < > 105 106 107  --   CO2 21* 23  --  22  < > 23  < > 21* 23 21*  --   GLUCOSE 97 145*  --  118*  < > 131*  < > 145* 111* 112*  --   BUN 89* 82*  --  76*  < > 53*  53*  < > 57*  57* 56*  56* 53*  53* 53*  CREATININE 4.04* 3.73*  --  3.61*  < > 2.79*  2.8*  < > 2.93*  2.9* 3.06*  3.1* 2.82*  2.8* 2.7*  CALCIUM 8.2* 8.1*  --  8.2*  < > 8.8*  < > 8.1* 8.2* 8.3*  --   MG  --   --  1.8  --   --  1.9  --   --   --   --   --   PHOS 3.9 3.5  --  4.1  --   --   --   --   --   --   --   < > = values in this  interval not displayed.  Recent Labs  04/09/16 0530 06/18/16 1627 06/24/16 1556  AST 18 26  26 19   ALT 8* 8*  8* 9*  ALKPHOS 47 40  40 52  BILITOT 0.5 0.3 0.5  PROT 6.3* 6.5 8.2*  ALBUMIN 2.7* 2.6* 2.9*    Recent Labs  06/21/16 1124 06/24/16 1556  06/29/16 0455 06/30/16 0520 07/06/16 07/13/16 1036  WBC 5.0 12.0  12.0*  < > 17.8  17.8* 12.8  12.8* 9.5 7.2  NEUTROABS 3.6 9.5*  --   --   --   --  5.2  HGB 9.1* 10.8*  10.8*  < > 8.4*  8.4* 9.1* 8.5* 8.6*  HCT 29.0* 32.4*  32*  < > 25.6*  26* 27.5* 28* 27.2*  MCV 92.7 89.8  < > 88.9 88.1  --  92.5  PLT 184 221  221  < > 191  191 212 191 187  < > = values in this interval not displayed.  Recent Labs  03/30/16  CHOL 122  LDLCALC 58  TRIG 67   No results found for: [facility] Lab Results  Component Value Date   TSH 22.371 (H) 06/25/2016   Lab Results  Component Value Date   HGBA1C 4.9 02/20/2016   Lab Results  Component Value Date   CHOL 122 03/30/2016   HDL 51 03/30/2016   LDLCALC 58 03/30/2016   TRIG 67 03/30/2016   CHOLHDL 1.9 12/25/2011    Significant Diagnostic Results in last 30 days:  Ct Abdomen Pelvis Wo Contrast  Result Date: 06/26/2016 CLINICAL DATA:  Right back pain for the past 7 days. Urinary tract infection. Moderate right hydronephrosis on a  renal ultrasound earlier today. EXAM: CT ABDOMEN AND PELVIS WITHOUT CONTRAST TECHNIQUE: Multidetector CT imaging of the abdomen and pelvis was performed following the standard protocol without IV contrast. COMPARISON:  Renal ultrasound earlier today. Abdomen and pelvis CT dated 04/08/2016. FINDINGS: Lower chest: Small bilateral pleural effusions. Mild bilateral lower lobe atelectasis. Hepatobiliary: Liver cysts.  Cholecystectomy clips. Pancreas: Unremarkable. No pancreatic ductal dilatation or surrounding inflammatory changes. Spleen: Normal in size without focal abnormality. Adrenals/Urinary Tract: Moderate dilatation of the right renal collecting system. Tiny upper pole right renal calculus or vascular calcification. No bladder or ureteral calculi a seen. Unremarkable left kidney. Grossly normal adrenal glands. Multiple bladder diverticula. Stomach/Bowel: Moderate diffuse low density wall thickening involving the colon, including the rectum. Grossly unremarkable stomach and small bowel. Vascular/Lymphatic: Extensive, dense, diffuse arterial calcifications, including the coronary arteries and the abdominal aorta and its branches. No enlarged lymph nodes. Reproductive: Normal sized prostate containing coarse calcifications. Other: The examination is limited by streak artifacts produced by an electronic device on the left side of the patient and limited by a diffuse edema of the subcutaneous, intraperitoneal and retroperitoneal fat. Musculoskeletal: Mild lower lumbar spine degenerative changes. IMPRESSION: 1. Interval diffuse colitis and proctitis. 2. Interval moderate right hydronephrosis with no visible cause. 3. Severe diffuse atheromatous arterial calcifications, including coronary artery atherosclerosis and aortic atherosclerosis. 4. Progressive diffuse anasarca. 5. Small bilateral pleural effusions. 6. Mild bilateral lower lobe atelectasis. 7. Multiple bladder diverticula. Electronically Signed   By: Claudie Revering M.D.   On: 06/26/2016 18:48   Dg Chest 2 View  Result Date: 06/24/2016 CLINICAL DATA:  Weakness, recent endocarditis EXAM: CHEST  2 VIEW COMPARISON:  05/25/2016 FINDINGS: Cardiomediastinal silhouette is stable. Status post CABG. Atherosclerotic calcifications of thoracic aorta. Bilateral small pleural effusion. Streaky bilateral basilar atelectasis or infiltrate right greater than  left. No pulmonary edema. IMPRESSION: Status post CABG. Atherosclerotic calcifications of thoracic aorta. Bilateral small pleural effusion. Streaky bilateral basilar atelectasis or infiltrate right greater than left. No pulmonary edema. Electronically Signed   By: Lahoma Crocker M.D.   On: 06/24/2016 15:05   Dg Ribs Unilateral Right  Result Date: 06/24/2016 CLINICAL DATA:  81 year old male with fall and a right posterior rib pain. EXAM: RIGHT RIBS - 2 VIEW COMPARISON:  Chest radiograph dated 06/24/2016 and chest radiograph dated 04/08/2016 FINDINGS: There is an age indeterminate nondisplaced fracture of the posterolateral aspect of the right ninth rib. The bones are osteopenic which limits evaluation for fracture. No other fracture identified. There is a small right pleural effusion. Right lung base opacity may represent atelectatic changes versus infiltrate. Trace fluid noted in the minor fissure. There is cardiomegaly. Median sternotomy wires and CABG surgical clips noted. There is atherosclerotic calcification of the aorta. There is degenerative changes of the spine. Right upper quadrant cholecystectomy clips noted. IMPRESSION: Nondisplaced fracture of the posterior aspect of the right ninth rib, age indeterminate. No pneumothorax. Small right pleural effusion with right lung base atelectasis versus infiltrate. Electronically Signed   By: Anner Crete M.D.   On: 06/24/2016 23:28   Mr Brain Wo Contrast  Result Date: 06/24/2016 CLINICAL DATA:  LEFT lower extremity weakness. History of prostate cancer, diabetes. EXAM:  MRI HEAD WITHOUT CONTRAST TECHNIQUE: Multiplanar, multiecho pulse sequences of the brain and surrounding structures were obtained without intravenous contrast. COMPARISON:  CT HEAD February 20, 2016 FINDINGS: BRAIN: No reduced diffusion to suggest acute ischemia or hypercellular tumor. Punctate chronic microhemorrhage LEFT parietal lobe. The ventricles and sulci are normal for patient's age. Patchy to confluent supratentorial white matter FLAIR T2 hyperintensities. No suspicious parenchymal signal, masses or mass effect. Multiple small cortical infarcts. Prominent symmetric perivascular spaces, normal variant. No abnormal extra-axial fluid collections. No extra-axial masses though, contrast enhanced sequences would be more sensitive. VASCULAR: Normal major intracranial vascular flow voids present at skull base. SKULL AND UPPER CERVICAL SPINE: No abnormal sellar expansion. No suspicious calvarial bone marrow signal. Craniocervical junction maintained. Trace atlantodental fusion. SINUSES/ORBITS: Lobulated LEFT maxillary sinus mucosal thickening on, trace remaining paranasal sinus mucosal thickening. Trace mastoid effusions. The included ocular globes and orbital contents are non-suspicious. OTHER: None. IMPRESSION: No acute intracranial process. Involutional changes. Moderate chronic small vessel ischemic disease. Electronically Signed   By: Elon Alas M.D.   On: 06/24/2016 21:16   Mr Lumbar Spine Wo Contrast  Result Date: 06/25/2016 CLINICAL DATA:  81 year old male with generalized weakness for 3 days. Lumbar back pain after a fall. Leg weakness. Initial encounter. EXAM: MRI LUMBAR SPINE WITHOUT CONTRAST TECHNIQUE: Multiplanar, multisequence MR imaging of the lumbar spine was performed. No intravenous contrast was administered. COMPARISON:  CT Abdomen and Pelvis 04/08/2016 FINDINGS: MRI imaging quality of the lumbar spine is significantly degraded by generalize loss of signal despite repeated imaging  attempts. The etiology of the signal loss seems to be generalized soft tissue edema such as anasarca. Segmentation: Normal as seen on the comparison CT Abdomen and Pelvis. Alignment: Stable since the prior CT. Mild grade 1 anterolisthesis of L5 on S1. Mild dextroconvex lumbar scoliosis. Vertebrae: Suboptimal characterization of marrow due to the decreased signal to noise. No definite marrow edema or acute osseous abnormality. Conus medullaris: Extends to the L1 level and appears normal. Paraspinal and other soft tissues: Diffuse soft tissue edema suspected such is anasarca. This also seems to affect the visible paraspinal muscles bilaterally. Very limited  visualization of abdominal viscera. The visualized kidneys appear stable to the prior CT. Disc levels: No significant lumbar spinal stenosis. Mild lumbar spine degeneration above L4. At L4-L5 there is disc bulging and moderate to severe facet and ligament flavum hypertrophy. Both facet joints are capacious and contain fluid. Small posteriorly situated synovial cyst on the left. No significant stenosis. Chronic mild anterolisthesis at L5-S1 with circumferential disc bulge and moderate to severe facet hypertrophy. No spinal stenosis. Up to mild lateral recess stenosis and bilateral L5 foraminal stenosis. IMPRESSION: 1. MRI of the lumbar spine significantly degraded by signal loss despite repeated imaging attempts, which seems to be due to generalized body soft tissue edema such as due to anasarca. 2. Given #1, diffuse lumbar Myositis is difficult to exclude. No acute osseous abnormality identified. 3. No significant lumbar spinal stenosis. Spinal degeneration primarily only at L4-L5 and L5-S1 where there is facet arthropathy in part related to mild spondylolisthesis. Subsequent up to mild multifactorial lateral recess and foraminal stenosis. Electronically Signed   By: Genevie Ann M.D.   On: 06/25/2016 10:29   US Renal  Result Date: 06/30/2016 CLINICAL DATA:   Hydronephrosis EXAM: RENAL / URINARY TRACT ULTRASOUND COMPLETE COMPARISON:  06/26/2016 FINDINGS: Right Kidney: Length: 11.7 cm. Diffusely increased parenchymal echogenicity is stable. Cyst measuring 1.4 cm is stable. Hydronephrosis has resolved. Left Kidney: Length: 10.0 cm. Increased echogenicity throughout the parenchyma. No hydronephrosis or mass. Bladder: Foley catheter decompresses the bladder. Additional findings: There is a small amount of ascites about the liver. IMPRESSION: Right hydronephrosis has resolved. Foley catheter decompresses the bladder. Small amount of ascites. Electronically Signed   By: Marybelle Killings M.D.   On: 06/30/2016 09:44   US Renal  Result Date: 06/26/2016 CLINICAL DATA:  Acute kidney failure EXAM: RENAL / URINARY TRACT ULTRASOUND COMPLETE COMPARISON:  None. FINDINGS: Right Kidney: Length: 10.6 cm. 1.6 cm cyst in the upper pole. Moderate hydronephrosis. Diffusely increased echotexture throughout the right kidney. Left Kidney: Length: 10.1 cm. Increased echotexture throughout the left kidney. No hydronephrosis or mass. Bladder: Layering debris dependently in the bladder. Mild bladder wall irregularity and thickening. IMPRESSION: Moderate right hydronephrosis. Increased echotexture within the kidneys bilaterally compatible with chronic medical renal disease. Irregular wall thickening throughout the bladder. While this could be related to bladder outlet obstruction, I cannot exclude infection or neoplasm. Recommend clinical correlation. Electronically Signed   By: Rolm Baptise M.D.   On: 06/26/2016 10:50    Assessment and Plan  FEVER - have ordered CMP, CBC, U/A with c and s , CXR Pa and lat and rapid flu; will floow codition and results  Gout L kNEE - COLCHICINE 1.2 MG NOW and 0.6 mg in an hour   SPoke at length with [facility]  midlrevel Time spent > 25 min;> 50% of time with patient was spent reviewing records, labs, tests and studies, counseling and developing plan of  care  Webb Silversmith D. [name], MD

## 2016-07-24 ENCOUNTER — Encounter: Payer: Self-pay | Admitting: Internal Medicine

## 2016-07-26 ENCOUNTER — Telehealth: Payer: Self-pay | Admitting: Interventional Cardiology

## 2016-07-26 NOTE — Telephone Encounter (Signed)
Notified the pt and Spouse that per Dr Tamala Julian, he reviewed scanned labs sent to him from [facility].  Below mentioned was endorsed to both parties.    Notes Recorded by Belva Crome, MD on 07/22/2016 at 5:12 PM EST Please let the patient know that we reviewed the lab results. He should continue the planned dosing of furosemide that was given to him at the last office visit.  Per the pts wife, she is standing by the RN who cares for the pt, and she will endorse recommendations per Dr Tamala Julian.  Wife verbalized understanding and agrees with this plan.

## 2016-07-26 NOTE — Telephone Encounter (Signed)
New message   Pt wife calling, she verbalized that she is returning call for rn

## 2016-07-29 ENCOUNTER — Ambulatory Visit: Payer: Medicare Other | Admitting: Cardiology

## 2016-07-29 ENCOUNTER — Other Ambulatory Visit: Payer: Medicare Other

## 2016-07-30 ENCOUNTER — Non-Acute Institutional Stay (SKILLED_NURSING_FACILITY): Payer: Medicare Other | Admitting: Internal Medicine

## 2016-07-30 ENCOUNTER — Encounter: Payer: Self-pay | Admitting: Internal Medicine

## 2016-07-30 DIAGNOSIS — N390 Urinary tract infection, site not specified: Secondary | ICD-10-CM

## 2016-07-30 DIAGNOSIS — B965 Pseudomonas (aeruginosa) (mallei) (pseudomallei) as the cause of diseases classified elsewhere: Secondary | ICD-10-CM | POA: Diagnosis not present

## 2016-07-30 DIAGNOSIS — J156 Pneumonia due to other aerobic Gram-negative bacteria: Secondary | ICD-10-CM

## 2016-07-30 DIAGNOSIS — N478 Other disorders of prepuce: Secondary | ICD-10-CM | POA: Diagnosis not present

## 2016-07-30 DIAGNOSIS — N342 Other urethritis: Secondary | ICD-10-CM | POA: Diagnosis not present

## 2016-07-30 DIAGNOSIS — A488 Other specified bacterial diseases: Secondary | ICD-10-CM

## 2016-07-30 NOTE — Progress Notes (Signed)
Location:  [place] Room Number: [phone] Place of Service:  SNF [phone])  [name]. [name], MD  Patient Care Team: Hulan Fess, MD as PCP - General (Family Medicine) Belva Crome, MD (Cardiology) Laurence Spates, MD (Gastroenterology) Heath Lark, MD as Consulting Physician (Hematology and Oncology) Patsey Berthold, NP as Nurse Practitioner (Cardiology) Cleon Gustin, MD as Consulting Physician (Urology) Greer Pickerel, MD as Consulting Physician (General Surgery)  Extended Emergency Contact Information Primary Emergency Contact: Lorelee Market Address: [address]          [place], [place] [address] [place] Phone: [phone] Mobile Phone: [phone] Relation: Spouse Secondary Emergency Contact: Harral,Charles Address: [address] [place], [place] [address] [place] Phone: [phone] Mobile Phone: [phone] Relation: Son    Allergies: Nsaids; Ibuprofen; and Ace inhibitors  Chief Complaint  Patient presents with  . Acute Visit    Acute    HPI: Patient is 81 y.o. male who has been seen recently for multiple lesions on foreskin and an ulcer on the glans who is being seen for a UTI, pseudomonas and serratia and also for a foreskin problem . First contact for UTI was yesterday per phone and first contact for second problem was 2 days ago when [facility] called me because pt's foreskin could no longer be retracted over the glans. Urine was still coming out and pt was in no discomfort as he is not today.No fever or MS changes.  Past Medical History:  Diagnosis Date  . Angiomyolipoma 2009   On both kidneys noted in 2009  . Arthritis    neck and left wrist  . Atrial fibrillation ([place])   . CAD (coronary artery disease)    s/b CABG 1994, and subsequent stents. Repeat CABG 12/2011,  . Chronic diastolic heart failure ([place])   . CKD (chronic kidney disease)   . Clostridium difficile diarrhea  03/2016  . Depressive disorder   . Diabetes mellitus type 2 with peripheral artery disease (HCC)    DIET CONTROLLED  . DVT (deep venous thrombosis) ([place]) 2011   Right arm  . Gastroparesis   . GERD (gastroesophageal reflux disease)   . Gout   . History of hiatal hernia   . Hyperlipidemia   . Hypertension   . Internal hemorrhoids without mention of complication   . Ischemic colitis ([place])   . 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 Myelodysplastic syndrome ([place]) 05/22/2013   With low hemoglobin and platelets treated with Procrit  . Osteopenia   . Peptic ulcer    S/p partial gastrectomy in 1969  . Peripheral artery disease ([place])   . Pneumonia 01/16/2016  . Pneumonia 03/2016  . Presence of permanent cardiac pacemaker   . Prostate cancer ([place]) 1997   XRT and lupron  . Renal artery stenosis ([place])   . Sick sinus syndrome ([place])   . Vitamin B 12 deficiency     Past Surgical History:  Procedure Laterality Date  . ABDOMINAL ANGIOGRAM N/A 05/25/2011   Procedure: ABDOMINAL ANGIOGRAM;  Surgeon: Serafina Mitchell, MD;  Location: [facility] CATH LAB;  Service: Cardiovascular;  Laterality: N/A;  . ABDOMINAL ANGIOGRAM N/A 02/13/2013   Procedure: ABDOMINAL ANGIOGRAM;  Surgeon: Serafina Mitchell, MD;  Location: [facility] CATH LAB;  Service: Cardiovascular;  Laterality: N/A;  . APPENDECTOMY  1991  . CELIAC ARTERY ANGIOPLASTY  05-16-12   and stenting  .  CHOLECYSTECTOMY  Oct 2009   Laparoscopic  . CORONARY ARTERY BYPASS GRAFT  01/22/1993  . CORONARY ARTERY BYPASS GRAFT  01/03/2012   Procedure: REDO CORONARY ARTERY BYPASS GRAFTING (CABG);  Surgeon: Gaye Pollack, MD;  Location: [place];  Service: Open Heart Surgery;  Laterality: N/A;  Redo CABG x  using bilateral internal mammary arteries;  left leg greater saphenous vein harvested endoscopically  . FOREIGN BODY REMOVAL ABDOMINAL Right 01/27/2016   Procedure: EXPOSURE OF RIGHT COMMON FEMORAL ARTERY AND REMOVAL FOREIGN;  Surgeon: Evans Lance, MD;  Location: [place];  Service:  Cardiovascular;  Laterality: Right;  . HIATAL HERNIA REPAIR     and ulcer repair  . I&D EXTREMITY Left 01/21/2016   Procedure: IRRIGATION AND DEBRIDEMENT EXTREMITY;  Surgeon: Milly Jakob, MD;  Location: [place];  Service: Orthopedics;  Laterality: Left;  . INGUINAL HERNIA REPAIR Right 10/28/2015   Procedure: OPEN RIGHT INGUINAL HERNIA REPAIR;  Surgeon: Greer Pickerel, MD;  Location: [facility];  Service: General;  Laterality: Right;  . INSERTION OF MESH Right 10/28/2015   Procedure: INSERTION OF MESH;  Surgeon: Greer Pickerel, MD;  [facility];  Service: General;  Laterality: Right;  . IR GENERIC HISTORICAL  02/02/2016   IR FLUORO GUIDE CV LINE LEFT 02/02/2016 Markus Daft, MD [facility]  . IR GENERIC HISTORICAL  02/02/2016   IR US GUIDE VASC ACCESS LEFT 02/02/2016 Markus Daft, MD [facility]  . IR GENERIC HISTORICAL  03/31/2016   IR REMOVAL TUN CV CATH W/O FL 03/31/2016 Arne Cleveland, MD [facility]  . LEFT HEART CATHETERIZATION WITH CORONARY/GRAFT ANGIOGRAM  12/24/2011   Procedure: LEFT HEART CATHETERIZATION WITH Beatrix Fetters;  Surgeon: Sinclair Grooms, MD;  Location: [facility] CATH LAB;  Service: Cardiovascular;;  . LOWER EXTREMITY ANGIOGRAM Bilateral 05/25/2011   Procedure: LOWER EXTREMITY ANGIOGRAM;  Surgeon: Serafina Mitchell, MD;  Location: [facility] CATH LAB;  Service: Cardiovascular;  Laterality: Bilateral;  . OTHER SURGICAL HISTORY  02/13/13   superior mesenteric artery angiogram  . OTHER SURGICAL HISTORY  05/16/12   Stent in stomach  . PACEMAKER GENERATOR CHANGE  12/10/2003   SJM Identity XL DR performed by Dr Leonia Reeves  . PACEMAKER INSERTION  10/18/1994   DDD pacemaker, [employer]. Gen change 12/10/2003.  Marland Kitchen PACEMAKER LEAD REMOVAL Right 01/27/2016   Procedure: PACEMAKER EXTRACTION;  Surgeon: Evans Lance, MD;  Location: [place];  Service: Cardiovascular;  Laterality: Right;  DR. Roxy Manns TO BACKUP CASE  . PARTIAL GASTRECTOMY  1969   Hx of ulcer s/p partial gastrectomy/ has pernicious anemia  . RENAL  ANGIOGRAM N/A 02/13/2013   Procedure: RENAL ANGIOGRAM;  Surgeon: Serafina Mitchell, MD;  Location: [facility] CATH LAB;  Service: Cardiovascular;  Laterality: N/A;  . TEE WITHOUT CARDIOVERSION N/A 01/26/2016   Procedure: TRANSESOPHAGEAL ECHOCARDIOGRAM (TEE);  Surgeon: Sueanne Margarita, MD;  Location: [facility] ENDOSCOPY;  Service: Cardiovascular;  Laterality: N/A;  . TEE WITHOUT CARDIOVERSION N/A 01/27/2016   Procedure: TRANSESOPHAGEAL ECHOCARDIOGRAM (TEE);  Surgeon: Evans Lance, MD;  Location: [place];  Service: Cardiovascular;  Laterality: N/A;  . TEE WITHOUT CARDIOVERSION N/A 04/12/2016   Procedure: TRANSESOPHAGEAL ECHOCARDIOGRAM (TEE);  Surgeon: Dorothy Spark, MD;  Location: [place];  Service: Cardiovascular;  Laterality: N/A;  . THROMBECTOMY / EMBOLECTOMY SUBCLAVIAN ARTERY  02/02/10   Right subclavian thromboectomy and venous angioplasty, and chronic mesenteric ischemia with Herculink stenting to superior mesenteric and celiac arteries - Dr. Trula Slade  . TRANSURETHRAL RESECTION OF PROSTATE N/A 02/22/2016   Procedure: Kathrene Alu,  CLOT EVACUATION, FULGERATION;  Surgeon: Alexis Frock, MD;  [facility];  Service: Urology;  Laterality: N/A;  . VISCERAL ANGIOGRAM N/A 05/25/2011   Procedure: VISCERAL ANGIOGRAM;  Surgeon: Serafina Mitchell, MD;  Location: [facility] CATH LAB;  Service: Cardiovascular;  Laterality: N/A;  . VISCERAL ANGIOGRAM Bilateral 12/28/2011   Procedure: VISCERAL ANGIOGRAM;  Surgeon: Serafina Mitchell, MD;  Location: [facility] CATH LAB;  Service: Cardiovascular;  Laterality: Bilateral;  . VISCERAL ANGIOGRAM N/A 05/16/2012   Procedure: VISCERAL ANGIOGRAM;  Surgeon: Serafina Mitchell, MD;  Location: [facility] CATH LAB;  Service: Cardiovascular;  Laterality: N/A;  . VISCERAL ANGIOGRAM N/A 02/13/2013   Procedure: VISCERAL ANGIOGRAM;  Surgeon: Serafina Mitchell, MD;  Location: [facility] CATH LAB;  Service: Cardiovascular;  Laterality: N/A;    Allergies as of 07/30/2016      Reactions   Nsaids Nausea Only   Reaction:  GI upset     Ibuprofen Other (See Comments)   Reaction:  GI upset    Ace Inhibitors Cough      Medication List       Accurate as of 07/30/16  1:43 PM. Always use your most recent med list.          acetaminophen 650 MG CR tablet Commonly known as:  TYLENOL Take 650 mg by mouth 2 (two) times daily.   amiodarone 100 MG tablet Commonly known as:  PACERONE Take 100 mg by mouth daily.   amLODipine 5 MG tablet Commonly known as:  NORVASC Take 1 tablet (5 mg total) by mouth daily.   atorvastatin 80 MG tablet Commonly known as:  LIPITOR Take 80 mg by mouth at bedtime.   calcium carbonate 1500 (600 Ca) MG Tabs tablet Commonly known as:  OSCAL Take 600 mg of elemental calcium by mouth daily with breakfast.   carvedilol 25 MG tablet Commonly known as:  COREG Take 1 tablet (25 mg total) by mouth 2 (two) times daily with a meal.   cyanocobalamin 1000 MCG/ML injection Commonly known as:  (VITAMIN B-12) Inject 1,000 mcg into the muscle every 30 (thirty) days.   epoetin alfa 10000 UNIT/ML injection Commonly known as:  EPOGEN,PROCRIT Inject 10,000 Units into the skin every 14 (fourteen) days.   feeding supplement (GLUCERNA 1.2 CAL) Liqd Take 237 mLs by mouth 2 (two) times daily as needed (for nutritional supplement).   feeding supplement (GLUCERNA SHAKE) Liqd Take 237 mLs by mouth 2 (two) times daily between meals. To support nutritional status   feeding supplement (PRO-STAT SUGAR FREE 64) Liqd Take 30 mLs by mouth 2 (two) times daily with a meal.   ferrous sulfate 325 (65 FE) MG tablet Take 1 tablet (325 mg total) by mouth 3 (three) times daily with meals.   furosemide 80 MG tablet Commonly known as:  LASIX Take 1 tablet (80 mg total) by mouth 2 (two) times daily.   hydrALAZINE 25 MG tablet Commonly known as:  APRESOLINE Take 3 tablets (75 mg total) by mouth 4 (four) times daily.   isosorbide mononitrate 120 MG 24 hr tablet Commonly known as:  IMDUR Take 120 mg by mouth daily.     leuprolide 30 MG injection Commonly known as:  LUPRON Inject 30 mg into the muscle every 4 (four) months.   levothyroxine 50 MCG tablet Commonly known as:  SYNTHROID Take 1 tablet (50 mcg total) by mouth daily before breakfast.   multivitamin with minerals Tabs tablet Take 1 tablet by mouth daily.   nystatin powder Generic drug:  nystatin  Apply 1 g topically 2 (two) times daily. To groin area for candida   silodosin 8 MG Caps capsule Commonly known as:  RAPAFLO Take 8 mg by mouth at bedtime.   warfarin 6 MG tablet Commonly known as:  COUMADIN Take one (1) tablets (6 mg total) by mouth daily on Sundays.   warfarin 5 MG tablet Commonly known as:  COUMADIN Take one (1) tablet (5 mg total) by mouth daily Monday to Saturday.       No orders of the defined types were placed in this encounter.   Immunization History  Administered Date(s) Administered  . Influenza Split 03/29/2015  . Influenza,inj,Quad PF,36+ Mos 03/20/2013  . PPD Test 01/16/2016    Social History  Substance Use Topics  . Smoking status: Former Smoker    Years: 20.00    Types: Cigarettes    Quit date: 07/12/1969  . Smokeless tobacco: Never Used  . Alcohol use No    Review of Systems  DATA OBTAINED: from patient, nurse GENERAL:  no fevers, fatigue, appetite changes SKIN: No itching, rash HEENT: No complaint RESPIRATORY: No cough, wheezing, SOB CARDIAC: No chest pain, palpitations, lower extremity edema  GI: No abdominal pain, No N/V/D or constipation, No heartburn or reflux  GU: No dysuria, frequency or urgency, or incontinence ; as per HPI MUSCULOSKELETAL: No unrelieved bone/joint pain NEUROLOGIC: No headache, dizziness  PSYCHIATRIC: No overt anxiety or sadness  Vitals:   07/30/16 1340  BP: (!) 167/73  Pulse: 74  Resp: 16  Temp: 99.1 F (37.3 C)   Body mass index is 28.73 kg/m. Physical Exam  GENERAL APPEARANCE: Alert, mod conversant, No acute distress  SKIN: No diaphoresis  rash HEENT: Unremarkable RESPIRATORY: Breathing is even, unlabored. Lung sounds are clear   CARDIOVASCULAR: Heart RRR no murmurs, rubs or gallops. + peripheral edema  GASTROINTESTINAL: Abdomen is soft, non-tender, not distended w/ normal bowel sounds.  GENITOURINARY: Bladder non tender, not distended  PENIS - lesions on foreskin are healing, CANNOT RETRACT FORSKIN BUT CAN PUT MY GLOVED FINGER underneath it and can feel glans and urethra; periurethral ulcer does not feel larger;pt does not express any pain with palpation MUSCULOSKELETAL: No abnormal joints or musculature NEUROLOGIC: Cranial nerves 2-12 grossly intact. Moves all extremities PSYCHIATRIC: Mood and affect appropriate to situation, no behavioral issues  Patient Active Problem List   Diagnosis Date Noted  . Hyperlipidemia 07/06/2016  . BPH (benign prostatic hyperplasia) 07/06/2016  . Acute back pain 06/24/2016  . Acute on chronic diastolic CHF (congestive heart failure) ([place]) 06/24/2016  . Debility 06/24/2016  . Leg weakness, bilateral 06/24/2016  . Pseudomonas urinary tract infection 06/24/2016  . Fall 06/24/2016  . Warfarin anticoagulation 06/24/2016  . Encounter for therapeutic drug monitoring 06/07/2016  . Pressure injury of skin 05/27/2016  . Acute deep vein thrombosis (DVT) of right upper extremity ([place]) 05/25/2016  . History of pacemaker 05/03/2016  . Anemia, iron deficiency 04/16/2016  . Endocarditis of tricuspid valve   . C. difficile colitis 04/10/2016  . HCAP (healthcare-associated pneumonia) 04/08/2016  . Chronic kidney disease, stage III (moderate) 03/09/2016  . Pressure ulcer 02/23/2016  . Seizure ([place]) 02/20/2016  . Palliative care encounter   . Goals of care, counseling/discussion   . Pacemaker infection ([place])   . Acute renal failure superimposed on stage 4 chronic kidney disease ([place]) 01/26/2016  . Bacteremia due to Staphylococcus aureus   . Pseudogout involving multiple joints 01/24/2016  . Bladder  mass 01/24/2016  . Hemoptysis   . Pain   .  Left upper extremity swelling   . Staphylococcus aureus bacteremia 01/17/2016  . CAP (community acquired pneumonia) 01/16/2016  . S/P hernia repair 10/28/2015  . Right inguinal hernia 09/09/2015  . GIB (gastrointestinal bleeding) 08/29/2015  . Hematuria 05/19/2015  . Thrombocytopenia ([place]) 11/11/2014  . Leukopenia 11/11/2014  . B12 deficiency 08/31/2013  . Pacemaker 08/26/2013  . On amiodarone therapy 08/21/2013  . Gout due to renal impairment, left ankle and foot 06/13/2013    Class: Acute  . CAD of autologous bypass graft 06/05/2013  . MDS (myelodysplastic syndrome) ([place]) 05/22/2013  . Anemia of chronic disease 03/19/2013  . CKD (chronic kidney disease), stage IV ([place]) 03/18/2013  . NSVT (nonsustained ventricular tachycardia) ([place]) 03/18/2013  . Chronic diastolic heart failure ([place]) 03/17/2013  . Diabetes mellitus ([place]) 03/17/2013  . Essential hypertension 03/17/2013  . Mesenteric artery stenosis ([place]) 05/01/2012  . PAF (paroxysmal atrial fibrillation) ([place]) 12/24/2011  . PAD (peripheral artery disease) ([place]) 12/24/2011  . Chronic vascular insufficiency of intestine (HCC) 12/20/2011  . Personal history of DVT (deep vein thrombosis) 07/12/2009    CMP     Component Value Date/Time   NA 141 07/06/2016   NA 146 (H) 08/10/2013 1324   K 3.9 07/06/2016   K 3.9 08/10/2013 1324   CL 107 06/30/2016 0520   CO2 21 (L) 06/30/2016 0520   CO2 27 08/10/2013 1324   GLUCOSE 112 (H) 06/30/2016 0520   GLUCOSE 105 08/10/2013 1324   BUN 53 (A) 07/06/2016   BUN 40.3 (H) 08/10/2013 1324   CREATININE 2.7 (A) 07/06/2016   CREATININE 2.82 (H) 06/30/2016 0520   CREATININE 3.05 (H) 06/18/2016 1627   CREATININE 2.6 (H) 08/10/2013 1324   CALCIUM 8.3 (L) 06/30/2016 0520   CALCIUM 9.4 08/10/2013 1324   PROT 8.2 (H) 06/24/2016 1556   PROT 7.0 08/10/2013 1324   ALBUMIN 2.9 (L) 06/24/2016 1556   ALBUMIN 3.8 08/10/2013 1324   AST 19 06/24/2016 1556    AST 14 08/10/2013 1324   ALT 9 (L) 06/24/2016 1556   ALT 8 08/10/2013 1324   ALKPHOS 52 06/24/2016 1556   ALKPHOS 47 08/10/2013 1324   BILITOT 0.5 06/24/2016 1556   BILITOT 0.33 08/10/2013 1324   GFRNONAA 20 (L) 06/30/2016 0520   GFRAA 23 (L) 06/30/2016 0520    Recent Labs  02/01/16 0450 02/02/16 0553 02/02/16 0554 02/03/16 0601  06/24/16 1556  06/28/16 0401 06/29/16 0455 06/30/16 0520 07/06/16  NA 139 139  --  139  < > 135*  135  < > 135*  135 137  137 136*  136 141  K 3.3* 3.3*  --  3.8  < > 3.8  3.8  < > 3.5  3.5 3.6  3.6 3.3* 3.9  CL 110 109  --  109  < > 103  < > 105 106 107  --   CO2 21* 23  --  22  < > 23  < > 21* 23 21*  --   GLUCOSE 97 145*  --  118*  < > 131*  < > 145* 111* 112*  --   BUN 89* 82*  --  76*  < > 53*  53*  < > 57*  57* 56*  56* 53*  53* 53*  CREATININE 4.04* 3.73*  --  3.61*  < > 2.79*  2.8*  < > 2.93*  2.9* 3.06*  3.1* 2.82*  2.8* 2.7*  CALCIUM 8.2* 8.1*  --  8.2*  < > 8.8*  < >  8.1* 8.2* 8.3*  --   MG  --   --  1.8  --   --  1.9  --   --   --   --   --   PHOS 3.9 3.5  --  4.1  --   --   --   --   --   --   --   < > = values in this interval not displayed.  Recent Labs  04/09/16 0530 06/18/16 1627 06/24/16 1556  AST 18 26  26 19   ALT 8* 8*  8* 9*  ALKPHOS 47 40  40 52  BILITOT 0.5 0.3 0.5  PROT 6.3* 6.5 8.2*  ALBUMIN 2.7* 2.6* 2.9*    Recent Labs  06/21/16 1124 06/24/16 1556  06/29/16 0455 06/30/16 0520 07/06/16 07/13/16 1036  WBC 5.0 12.0  12.0*  < > 17.8  17.8* 12.8  12.8* 9.5 7.2  NEUTROABS 3.6 9.5*  --   --   --   --  5.2  HGB 9.1* 10.8*  10.8*  < > 8.4*  8.4* 9.1* 8.5* 8.6*  HCT 29.0* 32.4*  32*  < > 25.6*  26* 27.5* 28* 27.2*  MCV 92.7 89.8  < > 88.9 88.1  --  92.5  PLT 184 221  221  < > 191  191 212 191 187  < > = values in this interval not displayed.  Recent Labs  03/30/16  CHOL 122  LDLCALC 58  TRIG 67   No results found for: [facility] Lab Results  Component Value Date   TSH 22.371 (H)  06/25/2016   Lab Results  Component Value Date   HGBA1C 4.9 02/20/2016   Lab Results  Component Value Date   CHOL 122 03/30/2016   HDL 51 03/30/2016   LDLCALC 58 03/30/2016   TRIG 67 03/30/2016   CHOLHDL 1.9 12/25/2011    Significant Diagnostic Results in last 30 days:  No results found.  Assessment and Plan  PSEUDOMONAS AERUGINOSA UTI/ SERRATIA MARCESCENS UTI; NON RETRACTABLE FORESKIN/LESIONS ON GLANS AND FORESKIN-  Pt's calc CrCl was 45 so will be using Cipro 500 mg BID for 10 days; will cont barrier cream on foreskin lesions and f/u with urology; main thing is if pt needed a foley I think we could place one here    Time spent > 35 min;> 50% of time with patient was spent reviewing records, labs, tests and studies, counseling and developing plan of care  Webb Silversmith D. [name], MD

## 2016-07-31 ENCOUNTER — Encounter: Payer: Self-pay | Admitting: Internal Medicine

## 2016-07-31 DIAGNOSIS — N342 Other urethritis: Secondary | ICD-10-CM | POA: Insufficient documentation

## 2016-07-31 DIAGNOSIS — N478 Other disorders of prepuce: Secondary | ICD-10-CM | POA: Insufficient documentation

## 2016-07-31 DIAGNOSIS — A488 Other specified bacterial diseases: Secondary | ICD-10-CM | POA: Insufficient documentation

## 2016-08-02 ENCOUNTER — Other Ambulatory Visit (HOSPITAL_BASED_OUTPATIENT_CLINIC_OR_DEPARTMENT_OTHER): Payer: Medicare Other

## 2016-08-02 ENCOUNTER — Other Ambulatory Visit: Payer: Self-pay | Admitting: Hematology and Oncology

## 2016-08-02 ENCOUNTER — Ambulatory Visit (HOSPITAL_BASED_OUTPATIENT_CLINIC_OR_DEPARTMENT_OTHER): Payer: Medicare Other

## 2016-08-02 VITALS — BP 159/57 | HR 77 | Temp 98.4°F | Resp 20

## 2016-08-02 DIAGNOSIS — N183 Chronic kidney disease, stage 3 (moderate): Secondary | ICD-10-CM

## 2016-08-02 DIAGNOSIS — D469 Myelodysplastic syndrome, unspecified: Secondary | ICD-10-CM

## 2016-08-02 LAB — CBC & DIFF AND RETIC
BASO%: 0.2 % (ref 0.0–2.0)
BASOS ABS: 0 10*3/uL (ref 0.0–0.1)
EOS ABS: 0.1 10*3/uL (ref 0.0–0.5)
EOS%: 0.9 % (ref 0.0–7.0)
HEMATOCRIT: 26 % — AB (ref 38.4–49.9)
HEMOGLOBIN: 8.1 g/dL — AB (ref 13.0–17.1)
IMMATURE RETIC FRACT: 10.6 % (ref 3.00–10.60)
LYMPH%: 11.4 % — AB (ref 14.0–49.0)
MCH: 28.1 pg (ref 27.2–33.4)
MCHC: 31.2 g/dL — ABNORMAL LOW (ref 32.0–36.0)
MCV: 90.3 fL (ref 79.3–98.0)
MONO#: 0.9 10*3/uL (ref 0.1–0.9)
MONO%: 8.1 % (ref 0.0–14.0)
NEUT#: 9.2 10*3/uL — ABNORMAL HIGH (ref 1.5–6.5)
NEUT%: 79.4 % — AB (ref 39.0–75.0)
PLATELETS: 271 10*3/uL (ref 140–400)
RBC: 2.88 10*6/uL — ABNORMAL LOW (ref 4.20–5.82)
RDW: 19 % — ABNORMAL HIGH (ref 11.0–14.6)
Retic %: 1.24 % (ref 0.80–1.80)
Retic Ct Abs: 35.71 10*3/uL (ref 34.80–93.90)
WBC: 11.6 10*3/uL — AB (ref 4.0–10.3)
lymph#: 1.3 10*3/uL (ref 0.9–3.3)

## 2016-08-02 MED ORDER — DARBEPOETIN ALFA 200 MCG/0.4ML IJ SOSY
200.0000 ug | PREFILLED_SYRINGE | Freq: Once | INTRAMUSCULAR | Status: AC
  Administered 2016-08-02: 200 ug via SUBCUTANEOUS
  Filled 2016-08-02: qty 0.4

## 2016-08-02 NOTE — Patient Instructions (Signed)
Darbepoetin Alfa injection What is this medicine? DARBEPOETIN ALFA (dar be POE e tin AL fa) helps your body make more red blood cells. It is used to treat anemia caused by chronic kidney failure and chemotherapy. This medicine may be used for other purposes; ask your health care provider or pharmacist if you have questions. What should I tell my health care provider before I take this medicine? They need to know if you have any of these conditions: -blood clotting disorders or history of blood clots -cancer patient not on chemotherapy -cystic fibrosis -heart disease, such as angina, heart failure, or a history of a heart attack -hemoglobin level of 12 g/dL or greater -high blood pressure -low levels of folate, iron, or vitamin B12 -seizures -an unusual or allergic reaction to darbepoetin, erythropoietin, albumin, hamster proteins, latex, other medicines, foods, dyes, or preservatives -pregnant or trying to get pregnant -breast-feeding How should I use this medicine? This medicine is for injection into a vein or under the skin. It is usually given by a health care professional in a hospital or clinic setting. If you get this medicine at home, you will be taught how to prepare and give this medicine. Do not shake the solution before you withdraw a dose. Use exactly as directed. Take your medicine at regular intervals. Do not take your medicine more often than directed. It is important that you put your used needles and syringes in a special sharps container. Do not put them in a trash can. If you do not have a sharps container, call your pharmacist or healthcare provider to get one. Talk to your pediatrician regarding the use of this medicine in children. While this medicine may be used in children as young as 1 year for selected conditions, precautions do apply. Overdosage: If you think you have taken too much of this medicine contact a poison control center or emergency room at once. NOTE:  This medicine is only for you. Do not share this medicine with others. What if I miss a dose? If you miss a dose, take it as soon as you can. If it is almost time for your next dose, take only that dose. Do not take double or extra doses. What may interact with this medicine? Do not take this medicine with any of the following medications: -epoetin alfa This list may not describe all possible interactions. Give your health care provider a list of all the medicines, herbs, non-prescription drugs, or dietary supplements you use. Also tell them if you smoke, drink alcohol, or use illegal drugs. Some items may interact with your medicine. What should I watch for while using this medicine? Visit your prescriber or health care professional for regular checks on your progress and for the needed blood tests and blood pressure measurements. It is especially important for the doctor to make sure your hemoglobin level is in the desired range, to limit the risk of potential side effects and to give you the best benefit. Keep all appointments for any recommended tests. Check your blood pressure as directed. Ask your doctor what your blood pressure should be and when you should contact him or her. As your body makes more red blood cells, you may need to take iron, folic acid, or vitamin B supplements. Ask your doctor or health care provider which products are right for you. If you have kidney disease continue dietary restrictions, even though this medication can make you feel better. Talk with your doctor or health care professional about the   foods you eat and the vitamins that you take. What side effects may I notice from receiving this medicine? Side effects that you should report to your doctor or health care professional as soon as possible: -allergic reactions like skin rash, itching or hives, swelling of the face, lips, or tongue -breathing problems -changes in vision -chest pain -confusion, trouble speaking  or understanding -feeling faint or lightheaded, falls -high blood pressure -muscle aches or pains -pain, swelling, warmth in the leg -rapid weight gain -severe headaches -sudden numbness or weakness of the face, arm or leg -trouble walking, dizziness, loss of balance or coordination -seizures (convulsions) -swelling of the ankles, feet, hands -unusually weak or tired Side effects that usually do not require medical attention (report to your doctor or health care professional if they continue or are bothersome): -diarrhea -fever, chills (flu-like symptoms) -headaches -nausea, vomiting -redness, stinging, or swelling at site where injected This list may not describe all possible side effects. Call your doctor for medical advice about side effects. You may report side effects to FDA at 1-800-FDA-1088. Where should I keep my medicine? Keep out of the reach of children. Store in a refrigerator between 2 and 8 degrees C (36 and 46 degrees F). Do not freeze. Do not shake. Throw away any unused portion if using a single-dose vial. Throw away any unused medicine after the expiration date. NOTE: This sheet is a summary. It may not cover all possible information. If you have questions about this medicine, talk to your doctor, pharmacist, or health care provider.    2016, Elsevier/Gold Standard. (2008-06-11 10:23:57)  

## 2016-08-03 ENCOUNTER — Ambulatory Visit: Payer: Medicare Other | Admitting: Interventional Cardiology

## 2016-08-09 ENCOUNTER — Ambulatory Visit: Payer: Medicare Other | Admitting: Physician Assistant

## 2016-08-09 ENCOUNTER — Other Ambulatory Visit: Payer: Medicare Other

## 2016-08-10 NOTE — Progress Notes (Signed)
CARDIOLOGY OFFICE NOTE  Date:  08/11/2016    [name] Marble Sr. Date of Birth: [birth year] Medical Record [id number]  PCP:  [name], MD  Cardiologist:  Tamala Julian  Chief Complaint  Patient presents with  . Congestive Heart Failure    3 week check - seen for Dr. Tamala Julian    History of Present Illness: [name]. is a 81 y.o. male who presents today for a 3 week check. Seen for Dr. Tamala Julian.   He has a history of CAD status post CABG 1994 with subsequent stents and repeat CABG in 2013, hypertension, HLD, CKD, chronic diastolic heart failure, sinus bradycardia sp PPM (SJM) by Dr Leonia Reeves.Has PAF, CHADSVASC=5but is not anticoagulated for atrial fibrillation due to prior GI bleed. He is on aspirin and Plavix. SawDr. Rayann Heman in 10/2015 to discuss watchman. Because he had no atrial fibrillation for the past 6 months conservative management was recommended. Amiodarone was continued. Has had prior C. difficile colitis, bacteremia, TV endocarditis with lead infection/device explanted and recurrent CHF exacerbation and a right upper arm DVT. Pacer lead explantation was required. He is now on Coumadin therapy.  Readmitted back in December with generalized weakness and a fall.  Since last office visit on December 8, he has been readmitted to the hospital and diuretic therapy once again has been significantly decreased. He again had recurrence of C. difficile while hospitalized. The most recent discharge occurred on 07/01/16.  Seen earlier this month by Dr. Tamala Julian - now at [facility]. Was volume overloaded. Refused to be readmitted due to fear of recurrent Cdiff.   Comes in today. Here with his son today. Not doing well. Not doing well at all. Very weak and tired. Some pain between the shoulder blades. Son notes he seems lifeless. No chest pain. Not able to stand. Says his swelling is "a little better". Little dizzy.   Past Medical History:  Diagnosis Date  . Angiomyolipoma 2009   On  both kidneys noted in 2009  . Arthritis    neck and left wrist  . Atrial fibrillation ([place])   . CAD (coronary artery disease)    s/b CABG 1994, and subsequent stents. Repeat CABG 12/2011,  . Chronic diastolic heart failure ([place])   . CKD (chronic kidney disease)   . Clostridium difficile diarrhea 03/2016  . Depressive disorder   . Diabetes mellitus type 2 with peripheral artery disease (HCC)    DIET CONTROLLED  . DVT (deep venous thrombosis) ([place]) 2011   Right arm  . Gastroparesis   . GERD (gastroesophageal reflux disease)   . Gout   . History of hiatal hernia   . Hyperlipidemia   . Hypertension   . Internal hemorrhoids without mention of complication   . Ischemic colitis ([place])   . 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 Myelodysplastic syndrome ([place]) 05/22/2013   With low hemoglobin and platelets treated with Procrit  . Osteopenia   . Peptic ulcer    S/p partial gastrectomy in 1969  . Peripheral artery disease ([place])   . Pneumonia 01/16/2016  . Pneumonia 03/2016  . Presence of permanent cardiac pacemaker   . Prostate cancer ([place]) 1997   XRT and lupron  . Renal artery stenosis ([place])   . Sick sinus syndrome ([place])   . Vitamin B 12 deficiency     Past Surgical History:  Procedure Laterality Date  . ABDOMINAL ANGIOGRAM N/A 05/25/2011   Procedure: ABDOMINAL ANGIOGRAM;  Surgeon: Serafina Mitchell, MD;  Location: [facility]  CATH LAB;  Service: Cardiovascular;  Laterality: N/A;  . ABDOMINAL ANGIOGRAM N/A 02/13/2013   Procedure: ABDOMINAL ANGIOGRAM;  Surgeon: Serafina Mitchell, MD;  Location: [facility] CATH LAB;  Service: Cardiovascular;  Laterality: N/A;  . APPENDECTOMY  1991  . CELIAC ARTERY ANGIOPLASTY  05-16-12   and stenting  . CHOLECYSTECTOMY  Oct 2009   Laparoscopic  . CORONARY ARTERY BYPASS GRAFT  01/22/1993  . CORONARY ARTERY BYPASS GRAFT  01/03/2012   Procedure: REDO CORONARY ARTERY BYPASS GRAFTING (CABG);  Surgeon: Gaye Pollack, MD;  Location: [place];  Service: Open Heart Surgery;  Laterality: N/A;   Redo CABG x  using bilateral internal mammary arteries;  left leg greater saphenous vein harvested endoscopically  . FOREIGN BODY REMOVAL ABDOMINAL Right 01/27/2016   Procedure: EXPOSURE OF RIGHT COMMON FEMORAL ARTERY AND REMOVAL FOREIGN;  Surgeon: Evans Lance, MD;  Location: [place];  Service: Cardiovascular;  Laterality: Right;  . HIATAL HERNIA REPAIR     and ulcer repair  . I&D EXTREMITY Left 01/21/2016   Procedure: IRRIGATION AND DEBRIDEMENT EXTREMITY;  Surgeon: Milly Jakob, MD;  Location: [place];  Service: Orthopedics;  Laterality: Left;  . INGUINAL HERNIA REPAIR Right 10/28/2015   Procedure: OPEN RIGHT INGUINAL HERNIA REPAIR;  Surgeon: Greer Pickerel, MD;  Location: [facility];  Service: General;  Laterality: Right;  . INSERTION OF MESH Right 10/28/2015   Procedure: INSERTION OF MESH;  Surgeon: Greer Pickerel, MD;  Location: [facility];  Service: General;  Laterality: Right;  . IR GENERIC HISTORICAL  02/02/2016   IR FLUORO GUIDE CV LINE LEFT 02/02/2016 Markus Daft, MD [facility]  . IR GENERIC HISTORICAL  02/02/2016   IR US GUIDE VASC ACCESS LEFT 02/02/2016 Markus Daft, MD [facility]  . IR GENERIC HISTORICAL  03/31/2016   IR REMOVAL TUN CV CATH W/O FL 03/31/2016 Arne Cleveland, MD [facility]  . LEFT HEART CATHETERIZATION WITH CORONARY/GRAFT ANGIOGRAM  12/24/2011   Procedure: LEFT HEART CATHETERIZATION WITH Beatrix Fetters;  Surgeon: Sinclair Grooms, MD;  Location: [facility] CATH LAB;  Service: Cardiovascular;;  . LOWER EXTREMITY ANGIOGRAM Bilateral 05/25/2011   Procedure: LOWER EXTREMITY ANGIOGRAM;  Surgeon: Serafina Mitchell, MD;  Location: [facility] CATH LAB;  Service: Cardiovascular;  Laterality: Bilateral;  . OTHER SURGICAL HISTORY  02/13/13   superior mesenteric artery angiogram  . OTHER SURGICAL HISTORY  05/16/12   Stent in stomach  . PACEMAKER GENERATOR CHANGE  12/10/2003   SJM Identity XL DR performed by Dr Leonia Reeves  . PACEMAKER INSERTION  10/18/1994   DDD pacemaker, [employer]. Gen change 12/10/2003.  Marland Kitchen  PACEMAKER LEAD REMOVAL Right 01/27/2016   Procedure: PACEMAKER EXTRACTION;  Surgeon: Evans Lance, MD;  Location: [place];  Service: Cardiovascular;  Laterality: Right;  DR. Roxy Manns TO BACKUP CASE  . PARTIAL GASTRECTOMY  1969   Hx of ulcer s/p partial gastrectomy/ has pernicious anemia  . RENAL ANGIOGRAM N/A 02/13/2013   Procedure: RENAL ANGIOGRAM;  Surgeon: Serafina Mitchell, MD;  Location: [facility] CATH LAB;  Service: Cardiovascular;  Laterality: N/A;  . TEE WITHOUT CARDIOVERSION N/A 01/26/2016   Procedure: TRANSESOPHAGEAL ECHOCARDIOGRAM (TEE);  Surgeon: Sueanne Margarita, MD;  Location: [facility] ENDOSCOPY;  Service: Cardiovascular;  Laterality: N/A;  . TEE WITHOUT CARDIOVERSION N/A 01/27/2016   Procedure: TRANSESOPHAGEAL ECHOCARDIOGRAM (TEE);  Surgeon: Evans Lance, MD;  Location: [place];  Service: Cardiovascular;  Laterality: N/A;  . TEE WITHOUT CARDIOVERSION N/A 04/12/2016   Procedure: TRANSESOPHAGEAL ECHOCARDIOGRAM (TEE);  Surgeon: Dorothy Spark, MD;  Location: [facility] ENDOSCOPY;  Service: Cardiovascular;  Laterality: N/A;  . THROMBECTOMY / EMBOLECTOMY SUBCLAVIAN ARTERY  02/02/10   Right subclavian thromboectomy and venous angioplasty, and chronic mesenteric ischemia with Herculink stenting to superior mesenteric and celiac arteries - Dr. Trula Slade  . TRANSURETHRAL RESECTION OF PROSTATE N/A 02/22/2016   Procedure: CYSTO, CLOT EVACUATION, FULGERATION;  Surgeon: Alexis Frock, MD;  Location: [facility];  Service: Urology;  Laterality: N/A;  . VISCERAL ANGIOGRAM N/A 05/25/2011   Procedure: VISCERAL ANGIOGRAM;  Surgeon: Serafina Mitchell, MD;  Location: [facility] CATH LAB;  Service: Cardiovascular;  Laterality: N/A;  . VISCERAL ANGIOGRAM Bilateral 12/28/2011   Procedure: VISCERAL ANGIOGRAM;  Surgeon: Serafina Mitchell, MD;  Location: [facility] CATH LAB;  Service: Cardiovascular;  Laterality: Bilateral;  . VISCERAL ANGIOGRAM N/A 05/16/2012   Procedure: VISCERAL ANGIOGRAM;  Surgeon: Serafina Mitchell, MD;  Location: [facility] CATH LAB;  Service:  Cardiovascular;  Laterality: N/A;  . VISCERAL ANGIOGRAM N/A 02/13/2013   Procedure: VISCERAL ANGIOGRAM;  Surgeon: Serafina Mitchell, MD;  Location: [facility] CATH LAB;  Service: Cardiovascular;  Laterality: N/A;     Medications: Current Outpatient Prescriptions  Medication Sig Dispense Refill  . acetaminophen (TYLENOL) 650 MG CR tablet Take 650 mg by mouth 2 (two) times daily.     . Amino Acids-Protein Hydrolys (FEEDING SUPPLEMENT, PRO-STAT SUGAR FREE 64,) LIQD Take 30 mLs by mouth 2 (two) times daily with a meal.     . amiodarone (PACERONE) 100 MG tablet Take 100 mg by mouth daily.     Marland Kitchen amLODipine (NORVASC) 5 MG tablet Take 1 tablet (5 mg total) by mouth daily. 30 tablet 0  . atorvastatin (LIPITOR) 80 MG tablet Take 80 mg by mouth at bedtime.     . calcium carbonate (OSCAL) 1500 (600 Ca) MG TABS tablet Take 600 mg of elemental calcium by mouth daily with breakfast.    . carvedilol (COREG) 25 MG tablet Take 1 tablet (25 mg total) by mouth 2 (two) times daily with a meal. 60 tablet 0  . ciprofloxacin (CIPRO) 500 MG tablet Take 500 mg by mouth 2 (two) times daily.    . cyanocobalamin (,VITAMIN B-12,) 1000 MCG/ML injection Inject 1,000 mcg into the muscle every 30 (thirty) days.     Marland Kitchen epoetin alfa (EPOGEN,PROCRIT) 57846 UNIT/ML injection Inject 10,000 Units into the skin every 14 (fourteen) days.     . feeding supplement, GLUCERNA SHAKE, (GLUCERNA SHAKE) LIQD Take 237 mLs by mouth 2 (two) times daily between meals. To support nutritional status    . ferrous sulfate 325 (65 FE) MG tablet Take 1 tablet (325 mg total) by mouth 3 (three) times daily with meals.  3  . furosemide (LASIX) 80 MG tablet Take 1 tablet (80 mg total) by mouth 2 (two) times daily. (Patient taking differently: Take 160 mg by mouth daily. ) 90 tablet 3  . hydrALAZINE (APRESOLINE) 25 MG tablet Take 3 tablets (75 mg total) by mouth 4 (four) times daily.    . isosorbide mononitrate (IMDUR) 120 MG 24 hr tablet Take 120 mg by mouth daily.       Marland Kitchen leuprolide (LUPRON) 30 MG injection Inject 30 mg into the muscle every 4 (four) months.     . levothyroxine (SYNTHROID) 50 MCG tablet Take 1 tablet (50 mcg total) by mouth daily before breakfast. 90 tablet 3  . Multiple Vitamin (MULTIVITAMIN WITH MINERALS) TABS tablet Take 1 tablet by mouth daily.     . Nutritional Supplements (FEEDING SUPPLEMENT, GLUCERNA 1.2 CAL,) LIQD  Take 237 mLs by mouth 2 (two) times daily as needed (for nutritional supplement).     . nystatin (NYSTATIN) powder Apply 1 g topically 2 (two) times daily. To groin area for candida    . potassium chloride SA (K-DUR,KLOR-CON) 20 MEQ tablet Take 20 mEq by mouth 2 (two) times daily.    . Saccharomyces boulardii (PROBIOTIC) 250 MG CAPS Take 1 capsule by mouth 2 (two) times daily.    . silodosin (RAPAFLO) 8 MG CAPS capsule Take 8 mg by mouth at bedtime.     Marland Kitchen warfarin (COUMADIN) 5 MG tablet Take one (1) tablet (5 mg total) by mouth daily Monday to Saturday.    . warfarin (COUMADIN) 6 MG tablet Take one (1) tablets (6 mg total) by mouth daily on Sundays.     No current facility-administered medications for this visit.     Allergies: Allergies  Allergen Reactions  . Nsaids Nausea Only    Reaction:  GI upset   . Ibuprofen Other (See Comments)    Reaction:  GI upset   . Ace Inhibitors Cough    Social History: The patient  reports that he quit smoking about 47 years ago. His smoking use included Cigarettes. He quit after 20.00 years of use. He has never used smokeless tobacco. He reports that he does not drink alcohol or use drugs.   Family History: The patient's family history includes CAD in his brother; CVA (age of onset: 40) in his mother; Cancer in his father, paternal uncle, and sister; Diabetes in his mother and sister; Heart attack in his daughter; Heart disease in his brother, daughter, mother, and sister; Hyperlipidemia in his mother; Hypertension in his daughter, father, mother, and sister.   Review of  Systems: Please see the history of present illness.   Otherwise, the review of systems is positive for none.   All other systems are reviewed and negative.   Physical Exam: VS:  BP 130/64   Pulse 70   Ht 5\' 10"  (1.778 m)   SpO2 96% Comment: at rest .  BMI There is no height or weight on file to calculate BMI.  Wt Readings from Last 3 Encounters:  07/30/16 200 lb 3.2 oz (90.8 kg)  07/23/16 174 lb (78.9 kg)  07/21/16 174 lb (78.9 kg)    General: Chronically ill. Hardly able to move. Very weak - almost lethargic. He will answer questions. Not in acute distress but clearly declining.  He was not able to stand to weigh. Can't even sit forward or take off his jacket for his exam.  HEENT: Normal.  Neck: Supple, no JVD, carotid bruits, or masses noted.  Cardiac: Regular rate and rhythm. No murmurs, rubs, or gallops. Legs with less edema. Neck veins flat.  Respiratory:  Lungs are clear to auscultation bilaterally with normal work of breathing.  GI: Soft and nontender.  MS: No deformity or atrophy. Gait not tested.  Skin: Warm and dry. Color is sallow. Neuro:  Strength and sensation are intact and no gross focal deficits noted.  Psych: Alert, appropriate and with normal affect.   LABORATORY DATA:  EKG:  EKG is not ordered today.  Lab Results  Component Value Date   WBC 11.6 (H) 08/02/2016   HGB 8.1 (L) 08/02/2016   HCT 26.0 (L) 08/02/2016   PLT 271 08/02/2016   GLUCOSE 112 (H) 06/30/2016   CHOL 122 03/30/2016   TRIG 67 03/30/2016   HDL 51 03/30/2016   LDLCALC 58 03/30/2016   ALT  9 (L) 06/24/2016   AST 19 06/24/2016   NA 141 07/06/2016   K 3.9 07/06/2016   CL 107 06/30/2016   CREATININE 2.7 (A) 07/06/2016   BUN 53 (A) 07/06/2016   CO2 21 (L) 06/30/2016   TSH 22.371 (H) 06/25/2016   PSA 3.00 04/24/2013   INR 3.32 07/01/2016   HGBA1C 4.9 02/20/2016   INR/Prothrombin Time  BNP (last 3 results)  Recent Labs  06/18/16 1627 06/24/16 1556 06/25/16 0340  BNP 891.5*  1,575.0* 1,430.8*    ProBNP (last 3 results) No results for input(s): PROBNP in the last 8760 hours.   Other Studies Reviewed Today:  TEE Study Conclusions from 04/2016  - Left ventricle: Wall thickness was increased in a pattern of   moderate LVH. Systolic function was normal. The estimated   ejection fraction was in the range of 55% to 60%. Wall motion was   normal; there were no regional wall motion abnormalities. - Aortic valve: There was trivial regurgitation. - Aorta: Severe non-mobile atherosclerotic plaque. - Ascending aorta: The ascending aorta was normal in size. - Mitral valve: There was mild regurgitation. - Left atrium: The atrium was dilated. No evidence of thrombus in   the atrial cavity or appendage. - Right ventricle: Systolic function was normal. - Right atrium: No evidence of thrombus in the atrial cavity or   appendage. - Atrial septum: No defect or patent foramen ovale was identified. - Tricuspid valve: There was mild-moderate regurgitation.  Impressions:  - Tricuspid valve is significantly thickened, predominantly the   posterior leaflet. There is a mobile echodensity measuring 1.6 x   1.5 cm attached to the posterior leaflet suspicious for a   vegetation.   TTE Study Conclusions from 03/2016  - Left ventricle: The cavity size was normal. Wall thickness was   increased in a pattern of moderate LVH. Systolic function was   normal. The estimated ejection fraction was in the range of 55%   to 60%. - Aortic valve: Valve area (VTI): 1.63 cm^2. Valve area (Vmax):   1.56 cm^2. Valve area (Vmean): 1.63 cm^2. - Mitral valve: There was mild regurgitation. - Left atrium: The atrium was mildly dilated. - Right atrium: Appears to be retained RA lead in RA by history the   RV lead has been removed from heart - Atrial septum: No defect or patent foramen ovale was identified. - Tricuspid valve: Cannot r/o vegetation beneath septal leaflet of   TV in RVOT seen  best on basal   SA images May represent chordae but would suggest f/u TEE. - Pulmonary arteries: PA peak pressure: 53 mm Hg (S). - Pericardium, extracardiac: There was a left pleural effusion.   Assessment/Plan:  1. Failure to thrive -  He is subsequently seen with Dr. Tamala Julian who was here in the office today.  Felt to have really declined since last visit with him. Volume status actually appears better - but patient was not able to weigh today. Dr. Tamala Julian has had long conversation regarding what he feels is a poor prognosis due to his multiple comorbidities and multiple recent admissions. Have initiated conversation regarding Hospice referral. Mr. Bohning wishes to be admitted and see what treatments can help and will consider Hospice referral as well. The patient will be transported via EMS to the [facility] ER for admission. No beds currently available. Will need hospitalist services to help as well.   2. Chronic diastolic HF - Dr. Tamala Julian feels his volume status actually looks better today but that he  is in a general decline/failure to thrive. Overall prognosis poor. Needs labs. Further disposition to follow. I have left him on his current dose of diuretics for now - may need to consider IV diuresis if labs would indicate this.   2. HTN - BP ok.   3. PAF - on amiodarone. Rhythm regular on exam.   4. CKD - needs labs.   5. Cdiff - recurrent.   6. Endocarditis requiring PPM to be explanted  7. DVT - on anticoagulation  8. Chronic coumadin therapy - needs INR  9. Anemia - needs recheck  10. Marked hypothyroidism - will get lab - most likely needs his replacement dose increased.    His overall prognosis is quite poor. Dr. Tamala Julian feels like he has less than 6 months to live. Patient wishes to be readmitted and continue with aggressive treatment but is willing to listen to Hospice. Transporting to [facility] ER (no beds) via EMS. Per the son, the patient is currently a full code. Palliative care consult  placed.   Current medicines are reviewed with the patient today.  The patient does not have concerns regarding medicines other than what has been noted above.  The following changes have been made:  See above.  Labs/ tests ordered today include:   No orders of the defined types were placed in this encounter.    Disposition:   Admitted to [facility] via EMS.  [facility]master will place consult for Hospitalist to see.   Patient is agreeable to this plan and will call if any problems develop in the interim.   SignedTruitt Merle, NP  08/11/2016 12:21 PM  [facility] [address] [place], [place]  [address] Phone: [phone] Fax: [phone]

## 2016-08-11 ENCOUNTER — Emergency Department (HOSPITAL_COMMUNITY): Payer: Medicare Other

## 2016-08-11 ENCOUNTER — Other Ambulatory Visit: Payer: Self-pay

## 2016-08-11 ENCOUNTER — Other Ambulatory Visit: Payer: Medicare Other | Admitting: *Deleted

## 2016-08-11 ENCOUNTER — Inpatient Hospital Stay
Admission: AD | Admit: 2016-08-11 | Payer: Medicare Other | Source: Ambulatory Visit | Admitting: Interventional Cardiology

## 2016-08-11 ENCOUNTER — Encounter: Payer: Self-pay | Admitting: Nurse Practitioner

## 2016-08-11 ENCOUNTER — Encounter (HOSPITAL_COMMUNITY): Payer: Self-pay | Admitting: Emergency Medicine

## 2016-08-11 ENCOUNTER — Encounter: Payer: Self-pay | Admitting: Internal Medicine

## 2016-08-11 ENCOUNTER — Inpatient Hospital Stay (HOSPITAL_COMMUNITY)
Admission: EM | Admit: 2016-08-11 | Discharge: 2016-08-19 | DRG: 291 | Disposition: A | Discharge status: Skilled Nursing Facility | Payer: Medicare Other | Attending: Internal Medicine | Admitting: Internal Medicine

## 2016-08-11 ENCOUNTER — Ambulatory Visit (INDEPENDENT_AMBULATORY_CARE_PROVIDER_SITE_OTHER): Payer: Medicare Other | Admitting: Nurse Practitioner

## 2016-08-11 VITALS — BP 130/64 | HR 70 | Ht 70.0 in

## 2016-08-11 DIAGNOSIS — Z7401 Bed confinement status: Secondary | ICD-10-CM

## 2016-08-11 DIAGNOSIS — J189 Pneumonia, unspecified organism: Secondary | ICD-10-CM | POA: Diagnosis present

## 2016-08-11 DIAGNOSIS — R532 Functional quadriplegia: Secondary | ICD-10-CM | POA: Diagnosis present

## 2016-08-11 DIAGNOSIS — N179 Acute kidney failure, unspecified: Secondary | ICD-10-CM | POA: Diagnosis present

## 2016-08-11 DIAGNOSIS — D638 Anemia in other chronic diseases classified elsewhere: Secondary | ICD-10-CM | POA: Diagnosis not present

## 2016-08-11 DIAGNOSIS — R6251 Failure to thrive (child): Secondary | ICD-10-CM | POA: Diagnosis present

## 2016-08-11 DIAGNOSIS — R627 Adult failure to thrive: Secondary | ICD-10-CM | POA: Diagnosis present

## 2016-08-11 DIAGNOSIS — E1143 Type 2 diabetes mellitus with diabetic autonomic (poly)neuropathy: Secondary | ICD-10-CM | POA: Diagnosis present

## 2016-08-11 DIAGNOSIS — I25810 Atherosclerosis of coronary artery bypass graft(s) without angina pectoris: Secondary | ICD-10-CM | POA: Diagnosis present

## 2016-08-11 DIAGNOSIS — A0472 Enterocolitis due to Clostridium difficile, not specified as recurrent: Secondary | ICD-10-CM | POA: Diagnosis present

## 2016-08-11 DIAGNOSIS — A4902 Methicillin resistant Staphylococcus aureus infection, unspecified site: Secondary | ICD-10-CM | POA: Diagnosis not present

## 2016-08-11 DIAGNOSIS — I10 Essential (primary) hypertension: Secondary | ICD-10-CM

## 2016-08-11 DIAGNOSIS — E1151 Type 2 diabetes mellitus with diabetic peripheral angiopathy without gangrene: Secondary | ICD-10-CM | POA: Diagnosis present

## 2016-08-11 DIAGNOSIS — Z8546 Personal history of malignant neoplasm of prostate: Secondary | ICD-10-CM

## 2016-08-11 DIAGNOSIS — M109 Gout, unspecified: Secondary | ICD-10-CM | POA: Diagnosis present

## 2016-08-11 DIAGNOSIS — I959 Hypotension, unspecified: Secondary | ICD-10-CM | POA: Diagnosis present

## 2016-08-11 DIAGNOSIS — Z7901 Long term (current) use of anticoagulants: Secondary | ICD-10-CM | POA: Diagnosis not present

## 2016-08-11 DIAGNOSIS — R531 Weakness: Secondary | ICD-10-CM

## 2016-08-11 DIAGNOSIS — K219 Gastro-esophageal reflux disease without esophagitis: Secondary | ICD-10-CM | POA: Diagnosis present

## 2016-08-11 DIAGNOSIS — Z95828 Presence of other vascular implants and grafts: Secondary | ICD-10-CM | POA: Diagnosis not present

## 2016-08-11 DIAGNOSIS — Z888 Allergy status to other drugs, medicaments and biological substances status: Secondary | ICD-10-CM

## 2016-08-11 DIAGNOSIS — R0600 Dyspnea, unspecified: Secondary | ICD-10-CM

## 2016-08-11 DIAGNOSIS — N184 Chronic kidney disease, stage 4 (severe): Secondary | ICD-10-CM

## 2016-08-11 DIAGNOSIS — E538 Deficiency of other specified B group vitamins: Secondary | ICD-10-CM | POA: Diagnosis present

## 2016-08-11 DIAGNOSIS — A0471 Enterocolitis due to Clostridium difficile, recurrent: Secondary | ICD-10-CM | POA: Diagnosis present

## 2016-08-11 DIAGNOSIS — E1122 Type 2 diabetes mellitus with diabetic chronic kidney disease: Secondary | ICD-10-CM | POA: Diagnosis present

## 2016-08-11 DIAGNOSIS — Z515 Encounter for palliative care: Secondary | ICD-10-CM

## 2016-08-11 DIAGNOSIS — E440 Moderate protein-calorie malnutrition: Secondary | ICD-10-CM | POA: Diagnosis present

## 2016-08-11 DIAGNOSIS — Z8679 Personal history of other diseases of the circulatory system: Secondary | ICD-10-CM | POA: Diagnosis not present

## 2016-08-11 DIAGNOSIS — I82621 Acute embolism and thrombosis of deep veins of right upper extremity: Secondary | ICD-10-CM | POA: Diagnosis present

## 2016-08-11 DIAGNOSIS — L89612 Pressure ulcer of right heel, stage 2: Secondary | ICD-10-CM | POA: Diagnosis present

## 2016-08-11 DIAGNOSIS — L89512 Pressure ulcer of right ankle, stage 2: Secondary | ICD-10-CM | POA: Diagnosis present

## 2016-08-11 DIAGNOSIS — N390 Urinary tract infection, site not specified: Secondary | ICD-10-CM | POA: Diagnosis not present

## 2016-08-11 DIAGNOSIS — I480 Paroxysmal atrial fibrillation: Secondary | ICD-10-CM | POA: Diagnosis present

## 2016-08-11 DIAGNOSIS — R062 Wheezing: Secondary | ICD-10-CM

## 2016-08-11 DIAGNOSIS — Z6826 Body mass index (BMI) 26.0-26.9, adult: Secondary | ICD-10-CM

## 2016-08-11 DIAGNOSIS — A4102 Sepsis due to Methicillin resistant Staphylococcus aureus: Secondary | ICD-10-CM | POA: Diagnosis not present

## 2016-08-11 DIAGNOSIS — E785 Hyperlipidemia, unspecified: Secondary | ICD-10-CM | POA: Diagnosis present

## 2016-08-11 DIAGNOSIS — Z886 Allergy status to analgesic agent status: Secondary | ICD-10-CM | POA: Diagnosis not present

## 2016-08-11 DIAGNOSIS — E039 Hypothyroidism, unspecified: Secondary | ICD-10-CM | POA: Diagnosis present

## 2016-08-11 DIAGNOSIS — R0602 Shortness of breath: Secondary | ICD-10-CM

## 2016-08-11 DIAGNOSIS — Z66 Do not resuscitate: Secondary | ICD-10-CM

## 2016-08-11 DIAGNOSIS — J9600 Acute respiratory failure, unspecified whether with hypoxia or hypercapnia: Secondary | ICD-10-CM | POA: Diagnosis present

## 2016-08-11 DIAGNOSIS — R601 Generalized edema: Secondary | ICD-10-CM | POA: Diagnosis not present

## 2016-08-11 DIAGNOSIS — I5032 Chronic diastolic (congestive) heart failure: Secondary | ICD-10-CM | POA: Diagnosis not present

## 2016-08-11 DIAGNOSIS — Z7982 Long term (current) use of aspirin: Secondary | ICD-10-CM

## 2016-08-11 DIAGNOSIS — Z9049 Acquired absence of other specified parts of digestive tract: Secondary | ICD-10-CM

## 2016-08-11 DIAGNOSIS — I130 Hypertensive heart and chronic kidney disease with heart failure and stage 1 through stage 4 chronic kidney disease, or unspecified chronic kidney disease: Secondary | ICD-10-CM | POA: Diagnosis present

## 2016-08-11 DIAGNOSIS — Z833 Family history of diabetes mellitus: Secondary | ICD-10-CM

## 2016-08-11 DIAGNOSIS — Y95 Nosocomial condition: Secondary | ICD-10-CM | POA: Diagnosis present

## 2016-08-11 DIAGNOSIS — I5033 Acute on chronic diastolic (congestive) heart failure: Secondary | ICD-10-CM | POA: Diagnosis present

## 2016-08-11 DIAGNOSIS — K5900 Constipation, unspecified: Secondary | ICD-10-CM | POA: Diagnosis present

## 2016-08-11 DIAGNOSIS — Z951 Presence of aortocoronary bypass graft: Secondary | ICD-10-CM

## 2016-08-11 DIAGNOSIS — I2510 Atherosclerotic heart disease of native coronary artery without angina pectoris: Secondary | ICD-10-CM | POA: Diagnosis present

## 2016-08-11 DIAGNOSIS — Z823 Family history of stroke: Secondary | ICD-10-CM

## 2016-08-11 DIAGNOSIS — E119 Type 2 diabetes mellitus without complications: Secondary | ICD-10-CM

## 2016-08-11 DIAGNOSIS — N400 Enlarged prostate without lower urinary tract symptoms: Secondary | ICD-10-CM | POA: Diagnosis present

## 2016-08-11 DIAGNOSIS — E876 Hypokalemia: Secondary | ICD-10-CM | POA: Diagnosis present

## 2016-08-11 DIAGNOSIS — L89150 Pressure ulcer of sacral region, unstageable: Secondary | ICD-10-CM | POA: Diagnosis present

## 2016-08-11 DIAGNOSIS — Z950 Presence of cardiac pacemaker: Secondary | ICD-10-CM

## 2016-08-11 DIAGNOSIS — I495 Sick sinus syndrome: Secondary | ICD-10-CM | POA: Diagnosis not present

## 2016-08-11 DIAGNOSIS — Z86718 Personal history of other venous thrombosis and embolism: Secondary | ICD-10-CM

## 2016-08-11 DIAGNOSIS — M8580 Other specified disorders of bone density and structure, unspecified site: Secondary | ICD-10-CM | POA: Diagnosis present

## 2016-08-11 DIAGNOSIS — I38 Endocarditis, valve unspecified: Secondary | ICD-10-CM | POA: Diagnosis present

## 2016-08-11 DIAGNOSIS — D469 Myelodysplastic syndrome, unspecified: Secondary | ICD-10-CM | POA: Diagnosis present

## 2016-08-11 DIAGNOSIS — R7881 Bacteremia: Secondary | ICD-10-CM | POA: Diagnosis not present

## 2016-08-11 DIAGNOSIS — Z955 Presence of coronary angioplasty implant and graft: Secondary | ICD-10-CM

## 2016-08-11 DIAGNOSIS — Z8249 Family history of ischemic heart disease and other diseases of the circulatory system: Secondary | ICD-10-CM

## 2016-08-11 DIAGNOSIS — Z7902 Long term (current) use of antithrombotics/antiplatelets: Secondary | ICD-10-CM

## 2016-08-11 DIAGNOSIS — Z79899 Other long term (current) drug therapy: Secondary | ICD-10-CM

## 2016-08-11 DIAGNOSIS — Z87891 Personal history of nicotine dependence: Secondary | ICD-10-CM

## 2016-08-11 DIAGNOSIS — M19032 Primary osteoarthritis, left wrist: Secondary | ICD-10-CM | POA: Diagnosis present

## 2016-08-11 DIAGNOSIS — Z7189 Other specified counseling: Secondary | ICD-10-CM | POA: Diagnosis not present

## 2016-08-11 LAB — COMPREHENSIVE METABOLIC PANEL
ALK PHOS: 56 U/L (ref 38–126)
ALT: 17 U/L (ref 17–63)
ALT: 15 U/L — ABNORMAL LOW (ref 17–63)
AST: 46 U/L — ABNORMAL HIGH (ref 15–41)
AST: 41 U/L (ref 15–41)
Albumin: 1.8 g/dL — ABNORMAL LOW (ref 3.5–5.0)
Albumin: 1.8 g/dL — ABNORMAL LOW (ref 3.5–5.0)
Alkaline Phosphatase: 55 U/L (ref 38–126)
Anion gap: 13 (ref 5–15)
Anion gap: 13 (ref 5–15)
BILIRUBIN TOTAL: 0.3 mg/dL (ref 0.3–1.2)
BUN: 91 mg/dL — ABNORMAL HIGH (ref 6–20)
BUN: 90 mg/dL — ABNORMAL HIGH (ref 6–20)
CALCIUM: 8.4 mg/dL — AB (ref 8.9–10.3)
CO2: 23 mmol/L (ref 22–32)
CO2: 23 mmol/L (ref 22–32)
CREATININE: 3.38 mg/dL — AB (ref 0.61–1.24)
Calcium: 8 mg/dL — ABNORMAL LOW (ref 8.9–10.3)
Chloride: 100 mmol/L — ABNORMAL LOW (ref 101–111)
Chloride: 100 mmol/L — ABNORMAL LOW (ref 101–111)
Creatinine, Ser: 3.27 mg/dL — ABNORMAL HIGH (ref 0.61–1.24)
GFR calc Af Amer: 19 mL/min — ABNORMAL LOW (ref >60)
GFR calc non Af Amer: 16 mL/min — ABNORMAL LOW (ref >60)
GFR calc non Af Amer: 17 mL/min — ABNORMAL LOW (ref >60)
GFR, EST AFRICAN AMERICAN: 19 mL/min — AB (ref >60)
GLUCOSE: 196 mg/dL — AB (ref 65–99)
Glucose, Bld: 171 mg/dL — ABNORMAL HIGH (ref 65–99)
Potassium: 2.8 mmol/L — ABNORMAL LOW (ref 3.5–5.1)
Potassium: 2.5 mmol/L — CL (ref 3.5–5.1)
SODIUM: 136 mmol/L (ref 135–145)
Sodium: 136 mmol/L (ref 135–145)
TOTAL PROTEIN: 7.3 g/dL (ref 6.5–8.1)
Total Bilirubin: 0.3 mg/dL (ref 0.3–1.2)
Total Protein: 7 g/dL (ref 6.5–8.1)

## 2016-08-11 LAB — CBC WITH DIFFERENTIAL/PLATELET
BASOS PCT: 0 %
Basophils Absolute: 0 10*3/uL (ref 0.0–0.1)
Basophils Absolute: 0 10*3/uL (ref 0.0–0.1)
Basophils Relative: 0 %
EOS ABS: 0.1 10*3/uL (ref 0.0–0.7)
EOS PCT: 0 %
Eosinophils Absolute: 0.1 10*3/uL (ref 0.0–0.7)
Eosinophils Relative: 0 %
HCT: 25.6 % — ABNORMAL LOW (ref 39.0–52.0)
HCT: 25.1 % — ABNORMAL LOW (ref 39.0–52.0)
Hemoglobin: 8.3 g/dL — ABNORMAL LOW (ref 13.0–17.0)
Hemoglobin: 8.1 g/dL — ABNORMAL LOW (ref 13.0–17.0)
LYMPHS ABS: 1.4 10*3/uL (ref 0.7–4.0)
Lymphocytes Relative: 11 %
Lymphocytes Relative: 11 %
Lymphs Abs: 1.5 10*3/uL (ref 0.7–4.0)
MCH: 28.1 pg (ref 26.0–34.0)
MCH: 27.6 pg (ref 26.0–34.0)
MCHC: 32.4 g/dL (ref 30.0–36.0)
MCHC: 32.3 g/dL (ref 30.0–36.0)
MCV: 86.8 fL (ref 78.0–100.0)
MCV: 85.7 fL (ref 78.0–100.0)
MONOS PCT: 7 %
Monocytes Absolute: 0.9 10*3/uL (ref 0.1–1.0)
Monocytes Absolute: 1 10*3/uL (ref 0.1–1.0)
Monocytes Relative: 7 %
Neutro Abs: 10.5 10*3/uL — ABNORMAL HIGH (ref 1.7–7.7)
Neutro Abs: 11.7 10*3/uL — ABNORMAL HIGH (ref 1.7–7.7)
Neutrophils Relative %: 82 %
Neutrophils Relative %: 82 %
PLATELETS: 241 10*3/uL (ref 150–400)
Platelets: 261 10*3/uL (ref 150–400)
RBC: 2.95 MIL/uL — ABNORMAL LOW (ref 4.22–5.81)
RBC: 2.93 MIL/uL — ABNORMAL LOW (ref 4.22–5.81)
RDW: 19.7 % — AB (ref 11.5–15.5)
RDW: 19.5 % — ABNORMAL HIGH (ref 11.5–15.5)
WBC: 12.8 10*3/uL — ABNORMAL HIGH (ref 4.0–10.5)
WBC: 14.2 10*3/uL — ABNORMAL HIGH (ref 4.0–10.5)

## 2016-08-11 LAB — URINALYSIS, ROUTINE W REFLEX MICROSCOPIC
BILIRUBIN URINE: NEGATIVE
Glucose, UA: NEGATIVE mg/dL
KETONES UR: NEGATIVE mg/dL
Nitrite: NEGATIVE
PROTEIN: 30 mg/dL — AB
SPECIFIC GRAVITY, URINE: 1.01 (ref 1.005–1.030)
SQUAMOUS EPITHELIAL / LPF: NONE SEEN
pH: 5 (ref 5.0–8.0)

## 2016-08-11 LAB — TROPONIN I: Troponin I: 0.07 ng/mL (ref <0.03)

## 2016-08-11 LAB — PROTIME-INR
INR: 2.57
INR: 2.15
Prothrombin Time: 28.1 seconds — ABNORMAL HIGH (ref 11.4–15.2)
Prothrombin Time: 24.3 seconds — ABNORMAL HIGH (ref 11.4–15.2)

## 2016-08-11 LAB — INFLUENZA PANEL BY PCR (TYPE A & B)
INFLAPCR: NEGATIVE
Influenza B By PCR: NEGATIVE

## 2016-08-11 LAB — BRAIN NATRIURETIC PEPTIDE
B NATRIURETIC PEPTIDE 5: 355.1 pg/mL — AB (ref 0.0–100.0)
B Natriuretic Peptide: 319 pg/mL — ABNORMAL HIGH (ref 0.0–100.0)

## 2016-08-11 LAB — I-STAT TROPONIN, ED: Troponin i, poc: 0.06 ng/mL (ref 0.00–0.08)

## 2016-08-11 LAB — MRSA PCR SCREENING: MRSA by PCR: POSITIVE — AB

## 2016-08-11 LAB — LIPASE, BLOOD: Lipase: 71 U/L — ABNORMAL HIGH (ref 11–51)

## 2016-08-11 LAB — MAGNESIUM: Magnesium: 1.3 mg/dL — ABNORMAL LOW (ref 1.7–2.4)

## 2016-08-11 LAB — AMMONIA: AMMONIA: 13 umol/L (ref 9–35)

## 2016-08-11 LAB — TSH: TSH: 32.676 u[IU]/mL — ABNORMAL HIGH (ref 0.350–4.500)

## 2016-08-11 MED ORDER — KCL IN DEXTROSE-NACL 40-5-0.45 MEQ/L-%-% IV SOLN
INTRAVENOUS | Status: DC
  Administered 2016-08-11 – 2016-08-14 (×5): via INTRAVENOUS
  Filled 2016-08-11 (×9): qty 1000

## 2016-08-11 MED ORDER — ISOSORBIDE MONONITRATE ER 60 MG PO TB24
120.0000 mg | ORAL_TABLET | Freq: Every day | ORAL | Status: DC
  Administered 2016-08-11 – 2016-08-19 (×9): 120 mg via ORAL
  Filled 2016-08-11 (×9): qty 2

## 2016-08-11 MED ORDER — ENSURE ENLIVE PO LIQD
237.0000 mL | Freq: Two times a day (BID) | ORAL | Status: DC
  Administered 2016-08-12: 237 mL via ORAL

## 2016-08-11 MED ORDER — SODIUM CHLORIDE 0.9 % IV SOLN: 250.0000 mL | INTRAVENOUS | Status: DC | PRN

## 2016-08-11 MED ORDER — SODIUM CHLORIDE 0.9% FLUSH: 3.0000 mL | INTRAVENOUS | Status: DC | PRN

## 2016-08-11 MED ORDER — AMIODARONE HCL 100 MG PO TABS
100.0000 mg | ORAL_TABLET | Freq: Every day | ORAL | Status: DC
  Administered 2016-08-11 – 2016-08-19 (×9): 100 mg via ORAL
  Filled 2016-08-11 (×9): qty 1

## 2016-08-11 MED ORDER — FUROSEMIDE 80 MG PO TABS
80.0000 mg | ORAL_TABLET | Freq: Two times a day (BID) | ORAL | Status: DC
  Administered 2016-08-11 – 2016-08-12 (×2): 80 mg via ORAL
  Filled 2016-08-11 (×2): qty 1

## 2016-08-11 MED ORDER — CARVEDILOL 25 MG PO TABS
25.0000 mg | ORAL_TABLET | Freq: Two times a day (BID) | ORAL | Status: DC
  Administered 2016-08-11 – 2016-08-18 (×12): 25 mg via ORAL
  Filled 2016-08-11 (×15): qty 1

## 2016-08-11 MED ORDER — AMLODIPINE BESYLATE 5 MG PO TABS
5.0000 mg | ORAL_TABLET | Freq: Every day | ORAL | Status: DC
  Administered 2016-08-11 – 2016-08-17 (×6): 5 mg via ORAL
  Filled 2016-08-11 (×7): qty 1

## 2016-08-11 MED ORDER — FERROUS SULFATE 325 (65 FE) MG PO TABS
325.0000 mg | ORAL_TABLET | Freq: Three times a day (TID) | ORAL | Status: DC
  Administered 2016-08-12 – 2016-08-13 (×4): 325 mg via ORAL
  Filled 2016-08-11 (×4): qty 1

## 2016-08-11 MED ORDER — HYDRALAZINE HCL 50 MG PO TABS
75.0000 mg | ORAL_TABLET | Freq: Four times a day (QID) | ORAL | Status: DC
  Administered 2016-08-11 – 2016-08-15 (×14): 75 mg via ORAL
  Filled 2016-08-11 (×16): qty 1

## 2016-08-11 MED ORDER — TAMSULOSIN HCL 0.4 MG PO CAPS
0.4000 mg | ORAL_CAPSULE | Freq: Every day | ORAL | Status: DC
  Administered 2016-08-11 – 2016-08-18 (×7): 0.4 mg via ORAL
  Filled 2016-08-11 (×8): qty 1

## 2016-08-11 MED ORDER — ONDANSETRON HCL 4 MG/2ML IJ SOLN: 4.0000 mg | Freq: Four times a day (QID) | INTRAMUSCULAR | Status: DC | PRN

## 2016-08-11 MED ORDER — SODIUM CHLORIDE 0.9% FLUSH
3.0000 mL | Freq: Two times a day (BID) | INTRAVENOUS | Status: DC
  Administered 2016-08-11 – 2016-08-19 (×12): 3 mL via INTRAVENOUS

## 2016-08-11 MED ORDER — ADULT MULTIVITAMIN W/MINERALS CH
1.0000 | ORAL_TABLET | Freq: Every day | ORAL | Status: DC
  Administered 2016-08-12 – 2016-08-13 (×3): 1 via ORAL
  Filled 2016-08-11 (×3): qty 1

## 2016-08-11 MED ORDER — WARFARIN - PHARMACIST DOSING INPATIENT
Freq: Every day | Status: DC
  Administered 2016-08-13: 1
  Administered 2016-08-18: 18:00:00

## 2016-08-11 MED ORDER — ACETAMINOPHEN 325 MG PO TABS
650.0000 mg | ORAL_TABLET | ORAL | Status: DC | PRN
  Administered 2016-08-13 – 2016-08-18 (×7): 650 mg via ORAL
  Filled 2016-08-11 (×7): qty 2

## 2016-08-11 MED ORDER — SODIUM CHLORIDE 0.9 % IV SOLN: Freq: Once | INTRAVENOUS | Status: DC

## 2016-08-11 MED ORDER — WARFARIN SODIUM 5 MG PO TABS
5.0000 mg | ORAL_TABLET | Freq: Once | ORAL | Status: AC
  Administered 2016-08-11: 5 mg via ORAL
  Filled 2016-08-11: qty 1

## 2016-08-11 MED ORDER — LEVOTHYROXINE SODIUM 50 MCG PO TABS
50.0000 ug | ORAL_TABLET | Freq: Every day | ORAL | Status: DC
  Administered 2016-08-12 – 2016-08-19 (×8): 50 ug via ORAL
  Filled 2016-08-11 (×8): qty 1

## 2016-08-11 MED ORDER — ATORVASTATIN CALCIUM 80 MG PO TABS
80.0000 mg | ORAL_TABLET | Freq: Every day | ORAL | Status: DC
  Administered 2016-08-11 – 2016-08-12 (×2): 80 mg via ORAL
  Filled 2016-08-11 (×3): qty 1

## 2016-08-11 NOTE — H&P (Addendum)
The patient has been seen in conjunction with Truitt Merle, in [place]. All aspects of care have been considered and discussed. The patient has been personally interviewed, examined, and all clinical data has been reviewed.   [name] has had multiple hospital stays over the past 6 months. Recurring episodes of C. difficile enterocolitis. For the past 6 weeks he has had anasarca without ability to mobilize fluid as an outpatient. He has refused hospital admission on several occasions because of concern about recurrent C. Difficile.  He returns today for follow-up after we significantly increased diuretic therapy to mobilize fluid. He has decreased sensorium/alertness, is not eating, and is extremely weak.  Conversation with the patient and son about management strategy going fall. We had an end-of-life discussion and hospice and palliative care were recommended. We requested that the patient help make a decision concerning treatment options. We agreed to admit him to the hospital, give 24-48 hours to see if we can get him feeling better, and simultaneously have consultation with hospice and palliative care. I feel his overall prognosis is quite poor given his multiple comorbidities. The son was in agreement with the plan.  After evaluating laboratory data we will decide on further diuresis is necessary. May need internal medicine management more than cardiology. We do know that the TSH is 22, which could be contributing somewhat to his altered sensorium and tendency for fluid retention.    [name] WHEATLEY Sr.  08/11/2016 11:30 AM  Office Visit  MRN:  [id number]  Description: Male DOB: 21-Apr-1936 Provider: Burtis Junes, NP Department: [address] [facility]  Vitals   BP  130/64   Pulse  70   Ht  5\' 10"  (1.778 m)   SpO2  96%       Vitals History  Progress Notes   Burtis Junes, NP at 08/11/2016 11:30 AM   Status: Signed  Expand All Collapse All       CARDIOLOGY OFFICE  NOTE  Date:  08/11/2016    [name] Date of Birth: [birth year] Medical Record [id number]  PCP:  [name], MD     Cardiologist:  Tamala Julian      Chief Complaint  Patient presents with  . Congestive Heart Failure    3 week check - seen for Dr. Tamala Julian    History of Present Illness: [name]. is a 81 y.o. male who presents today for a 3 week check. Seen for Dr. Tamala Julian.   He has a history of CAD status post CABG 1994 with subsequent stents and repeat CABG in 2013, hypertension, HLD, CKD, chronic diastolic heart failure, sinus bradycardia sp PPM (SJM) by Dr Leonia Reeves.Has PAF, CHADSVASC=5but is not anticoagulated for atrial fibrillation due to prior GI bleed. He is on aspirin and Plavix. SawDr. Rayann Heman in 10/2015 to discuss watchman. Because he had no atrial fibrillation for the past 6 months conservative management was recommended. Amiodarone was continued. Has had prior C. difficile colitis, bacteremia, TV endocarditis with lead infection/device explanted and recurrent CHF exacerbation and a right upper arm DVT. Pacer lead explantation was required. He is now on Coumadin therapy.  Readmitted back in December with generalized weakness and a fall.  Since last office visit on December 8, he has been readmitted to the hospital and diuretic therapy once again has been significantly decreased. He again had recurrence of C. difficile while hospitalized. The most recent discharge occurred on 07/01/16.  Seen earlier this month by Dr. Tamala Julian - now at [facility].  Was volume overloaded. Refused to be readmitted due to fear of recurrent Cdiff.   Comes in today. Here with his son today. Not doing well. Not doing well at all. Very weak and tired. Some pain between the shoulder blades. Son notes he seems lifeless. No chest pain. Not able to stand. Says his swelling is "a little better". Little dizzy.       Past Medical History:  Diagnosis Date  . Angiomyolipoma 2009   On  both kidneys noted in 2009  . Arthritis    neck and left wrist  . Atrial fibrillation ([place])   . CAD (coronary artery disease)    s/b CABG 1994, and subsequent stents. Repeat CABG 12/2011,  . Chronic diastolic heart failure ([place])   . CKD (chronic kidney disease)   . Clostridium difficile diarrhea 03/2016  . Depressive disorder   . Diabetes mellitus type 2 with peripheral artery disease (HCC)    DIET CONTROLLED  . DVT (deep venous thrombosis) ([place]) 2011   Right arm  . Gastroparesis   . GERD (gastroesophageal reflux disease)   . Gout   . History of hiatal hernia   . Hyperlipidemia   . Hypertension   . Internal hemorrhoids without mention of complication   . Ischemic colitis ([place])   . 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 Myelodysplastic syndrome ([place]) 05/22/2013   With low hemoglobin and platelets treated with Procrit  . Osteopenia   . Peptic ulcer    S/p partial gastrectomy in 1969  . Peripheral artery disease ([place])   . Pneumonia 01/16/2016  . Pneumonia 03/2016  . Presence of permanent cardiac pacemaker   . Prostate cancer ([place]) 1997   XRT and lupron  . Renal artery stenosis ([place])   . Sick sinus syndrome ([place])   . Vitamin B 12 deficiency          Past Surgical History:  Procedure Laterality Date  . ABDOMINAL ANGIOGRAM N/A 05/25/2011   Procedure: ABDOMINAL ANGIOGRAM;  Surgeon: Serafina Mitchell, MD;  Location: [facility] CATH LAB;  Service: Cardiovascular;  Laterality: N/A;  . ABDOMINAL ANGIOGRAM N/A 02/13/2013   Procedure: ABDOMINAL ANGIOGRAM;  Surgeon: Serafina Mitchell, MD;  Location: [facility] CATH LAB;  Service: Cardiovascular;  Laterality: N/A;  . APPENDECTOMY  1991  . CELIAC ARTERY ANGIOPLASTY  05-16-12   and stenting  . CHOLECYSTECTOMY  Oct 2009   Laparoscopic  . CORONARY ARTERY BYPASS GRAFT  01/22/1993  . CORONARY ARTERY BYPASS GRAFT  01/03/2012   Procedure: REDO CORONARY ARTERY BYPASS GRAFTING (CABG);  Surgeon: Gaye Pollack, MD;  Location: [place];   Service: Open Heart Surgery;  Laterality: N/A;  Redo CABG x  using bilateral internal mammary arteries;  left leg greater saphenous vein harvested endoscopically  . FOREIGN BODY REMOVAL ABDOMINAL Right 01/27/2016   Procedure: EXPOSURE OF RIGHT COMMON FEMORAL ARTERY AND REMOVAL FOREIGN;  Surgeon: Evans Lance, MD;  Location: [place];  Service: Cardiovascular;  Laterality: Right;  . HIATAL HERNIA REPAIR     and ulcer repair  . I&D EXTREMITY Left 01/21/2016   Procedure: IRRIGATION AND DEBRIDEMENT EXTREMITY;  Surgeon: Milly Jakob, MD;  Location: [place];  Service: Orthopedics;  Laterality: Left;  . INGUINAL HERNIA REPAIR Right 10/28/2015   Procedure: OPEN RIGHT INGUINAL HERNIA REPAIR;  Surgeon: Greer Pickerel, MD;  Location: [facility];  Service: General;  Laterality: Right;  . INSERTION OF MESH Right 10/28/2015   Procedure: INSERTION OF MESH;  Surgeon: Greer Pickerel, MD;  Location: [facility];  Service: General;  Laterality: Right;  . IR GENERIC HISTORICAL  02/02/2016   IR FLUORO GUIDE CV LINE LEFT 02/02/2016 Markus Daft, MD [facility]  . IR GENERIC HISTORICAL  02/02/2016   IR US GUIDE VASC ACCESS LEFT 02/02/2016 Markus Daft, MD [facility]  . IR GENERIC HISTORICAL  03/31/2016   IR REMOVAL TUN CV CATH W/O FL 03/31/2016 Arne Cleveland, MD [facility]  . LEFT HEART CATHETERIZATION WITH CORONARY/GRAFT ANGIOGRAM  12/24/2011   Procedure: LEFT HEART CATHETERIZATION WITH Beatrix Fetters;  Surgeon: Sinclair Grooms, MD;  Location: [facility] CATH LAB;  Service: Cardiovascular;;  . LOWER EXTREMITY ANGIOGRAM Bilateral 05/25/2011   Procedure: LOWER EXTREMITY ANGIOGRAM;  Surgeon: Serafina Mitchell, MD;  Location: [facility] CATH LAB;  Service: Cardiovascular;  Laterality: Bilateral;  . OTHER SURGICAL HISTORY  02/13/13   superior mesenteric artery angiogram  . OTHER SURGICAL HISTORY  05/16/12   Stent in stomach  . PACEMAKER GENERATOR CHANGE  12/10/2003   SJM Identity XL DR performed by Dr Leonia Reeves  . PACEMAKER  INSERTION  10/18/1994   DDD pacemaker, [employer] Jude. Gen change 12/10/2003.  Marland Kitchen PACEMAKER LEAD REMOVAL Right 01/27/2016   Procedure: PACEMAKER EXTRACTION;  Surgeon: Evans Lance, MD;  Location: [place];  Service: Cardiovascular;  Laterality: Right;  DR. Roxy Manns TO BACKUP CASE  . PARTIAL GASTRECTOMY  1969   Hx of ulcer s/p partial gastrectomy/ has pernicious anemia  . RENAL ANGIOGRAM N/A 02/13/2013   Procedure: RENAL ANGIOGRAM;  Surgeon: Serafina Mitchell, MD;  Location: [facility] CATH LAB;  Service: Cardiovascular;  Laterality: N/A;  . TEE WITHOUT CARDIOVERSION N/A 01/26/2016   Procedure: TRANSESOPHAGEAL ECHOCARDIOGRAM (TEE);  Surgeon: Sueanne Margarita, MD;  Location: [facility] ENDOSCOPY;  Service: Cardiovascular;  Laterality: N/A;  . TEE WITHOUT CARDIOVERSION N/A 01/27/2016   Procedure: TRANSESOPHAGEAL ECHOCARDIOGRAM (TEE);  Surgeon: Evans Lance, MD;  Location: [place];  Service: Cardiovascular;  Laterality: N/A;  . TEE WITHOUT CARDIOVERSION N/A 04/12/2016   Procedure: TRANSESOPHAGEAL ECHOCARDIOGRAM (TEE);  Surgeon: Dorothy Spark, MD;  Location: [place];  Service: Cardiovascular;  Laterality: N/A;  . THROMBECTOMY / EMBOLECTOMY SUBCLAVIAN ARTERY  02/02/10   Right subclavian thromboectomy and venous angioplasty, and chronic mesenteric ischemia with Herculink stenting to superior mesenteric and celiac arteries - Dr. Trula Slade  . TRANSURETHRAL RESECTION OF PROSTATE N/A 02/22/2016   Procedure: CYSTO, CLOT EVACUATION, FULGERATION;  Surgeon: Alexis Frock, MD;  Location: [facility];  Service: Urology;  Laterality: N/A;  . VISCERAL ANGIOGRAM N/A 05/25/2011   Procedure: VISCERAL ANGIOGRAM;  Surgeon: Serafina Mitchell, MD;  Location: [facility] CATH LAB;  Service: Cardiovascular;  Laterality: N/A;  . VISCERAL ANGIOGRAM Bilateral 12/28/2011   Procedure: VISCERAL ANGIOGRAM;  Surgeon: Serafina Mitchell, MD;  Location: [facility] CATH LAB;  Service: Cardiovascular;  Laterality: Bilateral;  . VISCERAL ANGIOGRAM N/A 05/16/2012   Procedure:  VISCERAL ANGIOGRAM;  Surgeon: Serafina Mitchell, MD;  Location[facility] C[facility] LAB;  Service: Cardiovascular;  Laterality: N/A;  . VISCERAL ANGIOGRAM N/A 02/13/2013   Procedure: VISCERAL ANGIOGRAM;  Surgeon: Serafina Mitchell, MD;  Location: [facility] CATH LAB;  Service: Cardiovascular;  Laterality: N/A;     Medications: Current Outpatient Prescriptions  Medication Sig Dispense Refill  . acetaminophen (TYLENOL) 650 MG CR tablet Take 650 mg by mouth 2 (two) times daily.     . Amino Acids-Protein Hydrolys (FEEDING SUPPLEMENT, PRO-STAT SUGAR FREE 64,) LIQD Take 30 mLs by mouth 2 (two) times daily with a meal.     . amiodarone (PACERONE) 100 MG tablet  Take 100 mg by mouth daily.     Marland Kitchen amLODipine (NORVASC) 5 MG tablet Take 1 tablet (5 mg total) by mouth daily. 30 tablet 0  . atorvastatin (LIPITOR) 80 MG tablet Take 80 mg by mouth at bedtime.     . calcium carbonate (OSCAL) 1500 (600 Ca) MG TABS tablet Take 600 mg of elemental calcium by mouth daily with breakfast.    . carvedilol (COREG) 25 MG tablet Take 1 tablet (25 mg total) by mouth 2 (two) times daily with a meal. 60 tablet 0  . ciprofloxacin (CIPRO) 500 MG tablet Take 500 mg by mouth 2 (two) times daily.    . cyanocobalamin (,VITAMIN B-12,) 1000 MCG/ML injection Inject 1,000 mcg into the muscle every 30 (thirty) days.     Marland Kitchen epoetin alfa (EPOGEN,PROCRIT) 16109 UNIT/ML injection Inject 10,000 Units into the skin every 14 (fourteen) days.     . feeding supplement, GLUCERNA SHAKE, (GLUCERNA SHAKE) LIQD Take 237 mLs by mouth 2 (two) times daily between meals. To support nutritional status    . ferrous sulfate 325 (65 FE) MG tablet Take 1 tablet (325 mg total) by mouth 3 (three) times daily with meals.  3  . furosemide (LASIX) 80 MG tablet Take 1 tablet (80 mg total) by mouth 2 (two) times daily. (Patient taking differently: Take 160 mg by mouth daily. ) 90 tablet 3  . hydrALAZINE (APRESOLINE) 25 MG tablet Take 3 tablets (75 mg total) by mouth 4  (four) times daily.    . isosorbide mononitrate (IMDUR) 120 MG 24 hr tablet Take 120 mg by mouth daily.     Marland Kitchen leuprolide (LUPRON) 30 MG injection Inject 30 mg into the muscle every 4 (four) months.     . levothyroxine (SYNTHROID) 50 MCG tablet Take 1 tablet (50 mcg total) by mouth daily before breakfast. 90 tablet 3  . Multiple Vitamin (MULTIVITAMIN WITH MINERALS) TABS tablet Take 1 tablet by mouth daily.     . Nutritional Supplements (FEEDING SUPPLEMENT, GLUCERNA 1.2 CAL,) LIQD Take 237 mLs by mouth 2 (two) times daily as needed (for nutritional supplement).     . nystatin (NYSTATIN) powder Apply 1 g topically 2 (two) times daily. To groin area for candida    . potassium chloride SA (K-DUR,KLOR-CON) 20 MEQ tablet Take 20 mEq by mouth 2 (two) times daily.    . Saccharomyces boulardii (PROBIOTIC) 250 MG CAPS Take 1 capsule by mouth 2 (two) times daily.    . silodosin (RAPAFLO) 8 MG CAPS capsule Take 8 mg by mouth at bedtime.     Marland Kitchen warfarin (COUMADIN) 5 MG tablet Take one (1) tablet (5 mg total) by mouth daily Monday to Saturday.    . warfarin (COUMADIN) 6 MG tablet Take one (1) tablets (6 mg total) by mouth daily on Sundays.     No current facility-administered medications for this visit.     Allergies:      Allergies  Allergen Reactions  . Nsaids Nausea Only    Reaction:  GI upset   . Ibuprofen Other (See Comments)    Reaction:  GI upset   . Ace Inhibitors Cough    Social History: The patient  reports that he quit smoking about 47 years ago. His smoking use included Cigarettes. He quit after 20.00 years of use. He has never used smokeless tobacco. He reports that he does not drink alcohol or use drugs.   Family History: The patient's family history includes CAD in his brother; CVA (age  of onset: 46) in his mother; Cancer in his father, paternal uncle, and sister; Diabetes in his mother and sister; Heart attack in his daughter; Heart disease in his  brother, daughter, mother, and sister; Hyperlipidemia in his mother; Hypertension in his daughter, father, mother, and sister.   Review of Systems: Please see the history of present illness.   Otherwise, the review of systems is positive for none.   All other systems are reviewed and negative.   Physical Exam: VS:  BP 130/64   Pulse 70   Ht 5\' 10"  (1.778 m)   SpO2 96% Comment: at rest .  BMI There is no height or weight on file to calculate BMI.     Wt Readings from Last 3 Encounters:  07/30/16 200 lb 3.2 oz (90.8 kg)  07/23/16 174 lb (78.9 kg)  07/21/16 174 lb (78.9 kg)    General: Chronically ill. Hardly able to move. Very weak - almost lethargic. He will answer questions. Not in acute distress but clearly declining.  He was not able to stand to weigh. Can't even sit forward or take off his jacket for his exam.  HEENT: Normal.  Neck: Supple, no JVD, carotid bruits, or masses noted.  Cardiac: Regular rate and rhythm. No murmurs, rubs, or gallops. Legs with less edema. Neck veins flat.  Respiratory:  Lungs are clear to auscultation bilaterally with normal work of breathing.  GI: Soft and nontender.  MS: No deformity or atrophy. Gait not tested.  Skin: Warm and dry. Color is sallow. Neuro:  Strength and sensation are intact and no gross focal deficits noted.  Psych: Alert, appropriate and with normal affect.   LABORATORY DATA:  EKG:  EKG is not ordered today.  RecentLabs       Lab Results  Component Value Date   WBC 11.6 (H) 08/02/2016   HGB 8.1 (L) 08/02/2016   HCT 26.0 (L) 08/02/2016   PLT 271 08/02/2016   GLUCOSE 112 (H) 06/30/2016   CHOL 122 03/30/2016   TRIG 67 03/30/2016   HDL 51 03/30/2016   LDLCALC 58 03/30/2016   ALT 9 (L) 06/24/2016   AST 19 06/24/2016   NA 141 07/06/2016   K 3.9 07/06/2016   CL 107 06/30/2016   CREATININE 2.7 (A) 07/06/2016   BUN 53 (A) 07/06/2016   CO2 21 (L) 06/30/2016   TSH 22.371 (H) 06/25/2016   PSA  3.00 04/24/2013   INR 3.32 07/01/2016   HGBA1C 4.9 02/20/2016     INR/Prothrombin Time  BNP (last 3 results)  RecentLabs(withinlast365days)   Recent Labs  06/18/16 1627 06/24/16 1556 06/25/16 0340  BNP 891.5* 1,575.0* 1,430.8*      ProBNP (last 3 results) RecentLabs(withinlast365days)  No results for input(s): PROBNP in the last 8760 hours.     Other Studies Reviewed Today:  TEE Study Conclusions from 04/2016  - Left ventricle: Wall thickness was increased in a pattern of moderate LVH. Systolic function was normal. The estimated ejection fraction was in the range of 55% to 60%. Wall motion was normal; there were no regional wall motion abnormalities. - Aortic valve: There was trivial regurgitation. - Aorta: Severe non-mobile atherosclerotic plaque. - Ascending aorta: The ascending aorta was normal in size. - Mitral valve: There was mild regurgitation. - Left atrium: The atrium was dilated. No evidence of thrombus in the atrial cavity or appendage. - Right ventricle: Systolic function was normal. - Right atrium: No evidence of thrombus in the atrial cavity or appendage. - Atrial septum:  No defect or patent foramen ovale was identified. - Tricuspid valve: There was mild-moderate regurgitation.  Impressions:  - Tricuspid valve is significantly thickened, predominantly the posterior leaflet. There is a mobile echodensity measuring 1.6 x 1.5 cm attached to the posterior leaflet suspicious for a vegetation.   TTE Study Conclusions from 03/2016  - Left ventricle: The cavity size was normal. Wall thickness was increased in a pattern of moderate LVH. Systolic function was normal. The estimated ejection fraction was in the range of 55% to 60%. - Aortic valve: Valve area (VTI): 1.63 cm^2. Valve area (Vmax): 1.56 cm^2. Valve area (Vmean): 1.63 cm^2. - Mitral valve: There was mild regurgitation. - Left atrium: The  atrium was mildly dilated. - Right atrium: Appears to be retained RA lead in RA by history the RV lead has been removed from heart - Atrial septum: No defect or patent foramen ovale was identified. - Tricuspid valve: Cannot r/o vegetation beneath septal leaflet of TV in RVOT seen best on basal SA images May represent chordae but would suggest f/u TEE. - Pulmonary arteries: PA peak pressure: 53 mm Hg (S). - Pericardium, extracardiac: There was a left pleural effusion.   Assessment/Plan:  1. Failure to thrive -  He is subsequently seen with Dr. Tamala Julian who was here in the office today.  Felt to have really declined since last visit with him. Volume status actually appears better - but patient was not able to weigh today. Dr. Tamala Julian has had long conversation regarding what he feels is a poor prognosis due to his multiple comorbidities and multiple recent admissions. Have initiated conversation regarding Hospice referral. Mr. Werito wishes to be admitted and see what treatments can help and will consider Hospice referral as well. The patient will be transported via EMS to the [facility] ER for admission. No beds currently available. Will need hospitalist services to help as well.   2. Chronic diastolic HF - Dr. Tamala Julian feels his volume status actually looks better today but that he is in a general decline/failure to thrive. Overall prognosis poor. Needs labs. Further disposition to follow. I have left him on his current dose of diuretics for now - may need to consider IV diuresis if labs would indicate this.   2. HTN - BP ok.   3. PAF - on amiodarone. Rhythm regular on exam.   4. CKD - needs labs.   5. Cdiff - recurrent.   6. Endocarditis requiring PPM to be explanted  7. DVT - on anticoagulation  8. Chronic coumadin therapy - needs INR  9. Anemia - needs recheck  10. Marked hypothyroidism - will get lab - most likely needs his replacement dose increased.    His overall  prognosis is quite poor. Dr. Tamala Julian feels like he has less than 6 months to live. Patient wishes to be readmitted and continue with aggressive treatment but is willing to listen to Hospice. Transporting to [facility] ER (no beds) via EMS. Per the son, the patient is currently a full code. Palliative care consult placed.   Current medicines are reviewed with the patient today.  The patient does not have concerns regarding medicines other than what has been noted above.  The following changes have been made:  See above.  Labs/ tests ordered today include:   No orders of the defined types were placed in this encounter.    Disposition:   Admitted to [facility] via EMS.  [facility] will place consult for Hospitalist to see. Palliative care has been consulted  as well.   Patient is agreeable to this plan and will call if any problems develop in the interim.   SignedTruitt Merle, NP  08/11/2016 12:21 PM  [place] [address] [place], [place]  [address] Phone: [phone] Fax: [phone]           Referring Provider   Hulan Fess, MD      Diagnoses    Codes Comments  Chronic diastolic heart failure ([place]) - Primary ICD-9-CM: 428.32 ICD-10-CM: I50.32   Essential hypertension  ICD-9-CM: 401.9 ICD-10-CM: I10   PAF (paroxysmal atrial fibrillation) ([place])  ICD-9-CM: 427.31 ICD-10-CM: I48.0   CKD (chronic kidney disease), stage IV ([place])  ICD-9-CM: 585.4 ICD-10-CM: N18.4   On amiodarone therapy  ICD-9-CM: V58.69 ICD-10-CM: Z79.899        Reason for Visit   Congestive Heart Failure 3 week check - seen for Dr. Tamala Julian  Reason for Visit History    Level of Service   Level of Service  PR OFFICE OUTPATIENT VISIT 25 MINUTES [99214]   Follow-up and Disposition   Routing History  Follow-up and Disposition History    All Charges for This Encounter   Code Description Service Date Service Provider Modifiers Qty  [place] OUTPATIENT VISIT 25 MINUTES 08/11/2016 Burtis Junes, NP  1  Routing History   From: Burtis Junes, NP On: 08/11/2016 12:50 PM  To: Belva Crome, MD  Priority: Routine  AVS Reports   No AVS Snapshots are available for this encounter.  Patient Instructions   We are admitting you to the hospital  Call the [employer] office at [phone] if you have any questions, problems or concerns.         Patient Instructions History  Routing History   Recipient Method Sent by Date Sent  Hulan Fess, MD Fax Burtis Junes, NP 08/11/2016  Fax: [phone] Phone: [phone]   Previous Visit   Date & Time 08/02/2016 10:45 AM Provider Heath Lark, MD [facility] Encounter # [id number]  Medical Necessity Transport Form   Medical Necessity  Tirell, Shrum Sr. [id number] [phone] y.o. M)

## 2016-08-11 NOTE — ED Notes (Signed)
On way to XR 

## 2016-08-11 NOTE — ED Notes (Signed)
Urinal at bedside for patient to urinate in.

## 2016-08-11 NOTE — ED Triage Notes (Signed)
Per EMS, patient has extensive cardiac, renal, and liver history.  End stage renal and liver disease.  From [place] home.  Wheelchair bound.  A/O x 4.  Failure to thrive, generalized weakness, and "I just do not feel good".  Denies pain, nausea, vomiting, diarrhea, fever.  Son [place].  Son en route to hospital.  Full code.  No paperwork for DNR as of yet but family discussing it.  166/72, 80 HR Regular.

## 2016-08-11 NOTE — ED Provider Notes (Signed)
Emergency Department Provider Note   I have reviewed the triage vital signs and the nursing notes.   HISTORY  Chief Complaint Failure To Thrive and Weakness   HPI [name]. is a 81 y.o. male with PMH of CAD, CKD, dCHF, DM, prior c. Diff infections, GERD, HTN, and HLD presents to the emergency department for evaluation of generalized weakness and severe fatigue with new onset pain between the shoulder blades. Patient states that the pain as a pressure-like sensation that is nonradiating. He describes it as mostly constant and present currently. No fever or chills. No dysuria. He is currently bed-bound and living in a nursing facility. He saw Dr. Tamala Julian in the Cardiology clinic today and was referred to the ED.   In reading the note Dr. Tamala Julian felt that the patient's volume status was overall improved. The patient was unable to stand and give a weight. Per the patient and family at bedside they have discussed hospice in the recent past and are still considering.   Patient denies any vomiting or diarrhea. He is eating and drinking but very small amounts and infrequently. No recent falls or head trauma.    Past Medical History:  Diagnosis Date  . Angiomyolipoma 2009   On both kidneys noted in 2009  . Arthritis    neck and left wrist  . Atrial fibrillation ([place])   . CAD (coronary artery disease)    s/b CABG 1994, and subsequent stents. Repeat CABG 12/2011,  . Chronic diastolic heart failure ([place])   . CKD (chronic kidney disease)   . Clostridium difficile diarrhea 03/2016  . Depressive disorder   . Diabetes mellitus type 2 with peripheral artery disease (HCC)    DIET CONTROLLED  . DVT (deep venous thrombosis) ([place]) 2011   Right arm  . Gastroparesis   . GERD (gastroesophageal reflux disease)   . Gout   . History of hiatal hernia   . Hyperlipidemia   . Hypertension   . Internal hemorrhoids without mention of complication   . Ischemic colitis ([place])   . Liddle's  syndrome ([place])   . Myelodysplastic syndrome ([place]) 05/22/2013   With low hemoglobin and platelets treated with Procrit  . Osteopenia   . Peptic ulcer    S/p partial gastrectomy in 1969  . Peripheral artery disease ([place])   . Pneumonia 01/16/2016  . Pneumonia 03/2016  . Presence of permanent cardiac pacemaker   . Prostate cancer ([place]) 1997   XRT and lupron  . Renal artery stenosis ([place])   . Sick sinus syndrome ([place])   . Vitamin B 12 deficiency     Patient Active Problem List   Diagnosis Date Noted  . Failure to thrive (0-17) 08/11/2016  . Serratia marcescens infection ([place]) 07/31/2016  . Foreskin does not retract 07/31/2016  . Ulcer of urethral meatus 07/31/2016  . Hyperlipidemia 07/06/2016  . BPH (benign prostatic hyperplasia) 07/06/2016  . Acute back pain 06/24/2016  . Acute on chronic diastolic CHF (congestive heart failure) ([place]) 06/24/2016  . Debility 06/24/2016  . Leg weakness, bilateral 06/24/2016  . Pseudomonas urinary tract infection 06/24/2016  . Fall 06/24/2016  . Warfarin anticoagulation 06/24/2016  . Encounter for therapeutic drug monitoring 06/07/2016  . Pressure injury of skin 05/27/2016  . Acute deep vein thrombosis (DVT) of right upper extremity ([place]) 05/25/2016  . History of pacemaker 05/03/2016  . Anemia, iron deficiency 04/16/2016  . Endocarditis of tricuspid valve   . C. difficile colitis 04/10/2016  . HCAP (healthcare-associated  pneumonia) 04/08/2016  . Chronic kidney disease, stage III (moderate) 03/09/2016  . Pressure ulcer 02/23/2016  . Seizure ([place]) 02/20/2016  . Palliative care encounter   . Goals of care, counseling/discussion   . Pacemaker infection ([place])   . Acute renal failure superimposed on stage 4 chronic kidney disease ([place]) 01/26/2016  . Bacteremia due to Staphylococcus aureus   . Pseudogout involving multiple joints 01/24/2016  . Bladder mass 01/24/2016  . Hemoptysis   . Pain   . Left upper extremity swelling   . Staphylococcus  aureus bacteremia 01/17/2016  . CAP (community acquired pneumonia) 01/16/2016  . S/P hernia repair 10/28/2015  . Right inguinal hernia 09/09/2015  . GIB (gastrointestinal bleeding) 08/29/2015  . Hematuria 05/19/2015  . Thrombocytopenia ([place]) 11/11/2014  . Leukopenia 11/11/2014  . B12 deficiency 08/31/2013  . Pacemaker 08/26/2013  . On amiodarone therapy 08/21/2013  . Gout due to renal impairment, left ankle and foot 06/13/2013    Class: Acute  . CAD of autologous bypass graft 06/05/2013  . MDS (myelodysplastic syndrome) ([place]) 05/22/2013  . Anemia of chronic disease 03/19/2013  . CKD (chronic kidney disease), stage IV ([place]) 03/18/2013  . NSVT (nonsustained ventricular tachycardia) ([place]) 03/18/2013  . Chronic diastolic heart failure ([place]) 03/17/2013  . Diabetes mellitus ([place]) 03/17/2013  . Essential hypertension 03/17/2013  . Mesenteric artery stenosis ([place]) 05/01/2012  . PAF (paroxysmal atrial fibrillation) ([place]) 12/24/2011  . PAD (peripheral artery disease) ([place]) 12/24/2011  . Chronic vascular insufficiency of intestine (HCC) 12/20/2011  . Personal history of DVT (deep vein thrombosis) 07/12/2009    Past Surgical History:  Procedure Laterality Date  . ABDOMINAL ANGIOGRAM N/A 05/25/2011   Procedure: ABDOMINAL ANGIOGRAM;  Surgeon: Serafina Mitchell, MD;  Location: [facility] CATH LAB;  Service: Cardiovascular;  Laterality: N/A;  . ABDOMINAL ANGIOGRAM N/A 02/13/2013   Procedure: ABDOMINAL ANGIOGRAM;  Surgeon: Serafina Mitchell, MD;  Location: [facility] CATH LAB;  Service: Cardiovascular;  Laterality: N/A;  . APPENDECTOMY  1991  . CELIAC ARTERY ANGIOPLASTY  05-16-12   and stenting  . CHOLECYSTECTOMY  Oct 2009   Laparoscopic  . CORONARY ARTERY BYPASS GRAFT  01/22/1993  . CORONARY ARTERY BYPASS GRAFT  01/03/2012   Procedure: REDO CORONARY ARTERY BYPASS GRAFTING (CABG);  Surgeon: Gaye Pollack, MD;  Location: [facility];  Service: Open Heart Surgery;  Laterality: N/A;  Redo CABG x  using bilateral internal  mammary arteries;  left leg greater saphenous vein harvested endoscopically  . FOREIGN BODY REMOVAL ABDOMINAL Right 01/27/2016   Procedure: EXPOSURE OF RIGHT COMMON FEMORAL ARTERY AND REMOVAL FOREIGN;  Surgeon: Evans Lance, MD;  Location: [facility];  Service: Cardiovascular;  Laterality: Right;  . HIATAL HERNIA REPAIR     and ulcer repair  . I&D EXTREMITY Left 01/21/2016   Procedure: IRRIGATION AND DEBRIDEMENT EXTREMITY;  Surgeon: Milly Jakob, MD;  Location: [facility];  Service: Orthopedics;  Laterality: Left;  . INGUINAL HERNIA REPAIR Right 10/28/2015   Procedure: OPEN RIGHT INGUINAL HERNIA REPAIR;  Surgeon: Greer Pickerel, MD;  Location: [facility];  Service: General;  Laterality: Right;  . INSERTION OF MESH Right 10/28/2015   Procedure: INSERTION OF MESH;  Surgeon: Greer Pickerel, MD;  Location: [facility];  Service: General;  Laterality: Right;  . IR GENERIC HISTORICAL  02/02/2016   IR FLUORO GUIDE CV LINE LEFT 02/02/2016 Markus Daft, MD [facility]  . IR GENERIC HISTORICAL  02/02/2016   IR US GUIDE VASC ACCESS LEFT 02/02/2016 Markus Daft, MD [facility]  . IR GENERIC  HISTORICAL  03/31/2016   IR REMOVAL TUN CV CATH W/O FL 03/31/2016 Arne Cleveland, MD [facility]  . LEFT HEART CATHETERIZATION WITH CORONARY/GRAFT ANGIOGRAM  12/24/2011   Procedure: LEFT HEART CATHETERIZATION WITH Beatrix Fetters;  Surgeon: Sinclair Grooms, MD;  Location[facility] C[facility] LAB;  Service: Cardiovascular;;  . LOWER EXTREMITY ANGIOGRAM Bilateral 05/25/2011   Procedure: LOWER EXTREMITY ANGIOGRAM;  Surgeon: Serafina Mitchell, MD;  Location[facility] CATH LAB;  Service: Cardiovascular;  Laterality: Bilateral;  . OTHER SURGICAL HISTORY  02/13/13   superior mesenteric artery angiogram  . OTHER SURGICAL HISTORY  05/16/12   Stent in stomach  . PACEMAKER GENERATOR CHANGE  12/10/2003   SJM Identity XL DR performed by Dr Leonia Reeves  . PACEMAKER INSERTION  10/18/1994   DDD pacemaker, [employer]. Gen change 12/10/2003.  Marland Kitchen PACEMAKER LEAD REMOVAL Right 01/27/2016     Procedure: PACEMAKER EXTRACTION;  Surgeon: Evans Lance, MD;  Location: [facility];  Service: Cardiovascular;  Laterality: Right;  DR. Roxy Manns TO BACKUP CASE  . PARTIAL GASTRECTOMY  1969   Hx of ulcer s/p partial gastrectomy/ has pernicious anemia  . RENAL ANGIOGRAM N/A 02/13/2013   Procedure: RENAL ANGIOGRAM;  Surgeon: Serafina Mitchell, MD;  Location[facility] CATH LAB;  Service: Cardiovascular;  Laterality: N/A;  . TEE WITHOUT CARDIOVERSION N/A 01/26/2016   Procedure: TRANSESOPHAGEAL ECHOCARDIOGRAM (TEE);  Surgeon: Sueanne Margarita, MD;  Location: [facility] ENDOSCOPY;  Service: Cardiovascular;  Laterality: N/A;  . TEE WITHOUT CARDIOVERSION N/A 01/27/2016   Procedure: TRANSESOPHAGEAL ECHOCARDIOGRAM (TEE);  Surgeon: Evans Lance, MD;  Location: [facility];  Service: Cardiovascular;  Laterality: N/A;  . TEE WITHOUT CARDIOVERSION N/A 04/12/2016   Procedure: TRANSESOPHAGEAL ECHOCARDIOGRAM (TEE);  Surgeon: Dorothy Spark, MD;  Location: [facility];  Service: Cardiovascular;  Laterality: N/A;  . THROMBECTOMY / EMBOLECTOMY SUBCLAVIAN ARTERY  02/02/10   Right subclavian thromboectomy and venous angioplasty, and chronic mesenteric ischemia with Herculink stenting to superior mesenteric and celiac arteries - Dr. Trula Slade  . TRANSURETHRAL RESECTION OF PROSTATE N/A 02/22/2016   Procedure: CYSTO, CLOT EVACUATION, FULGERATION;  Surgeon: Alexis Frock, MD;  [facility];  Service: Urology;  Laterality: N/A;  . VISCERAL ANGIOGRAM N/A 05/25/2011   Procedure: VISCERAL ANGIOGRAM;  Surgeon: Serafina Mitchell, MD;  Location: [facility] CATH LAB;  Service: Cardiovascular;  Laterality: N/A;  . VISCERAL ANGIOGRAM Bilateral 12/28/2011   Procedure: VISCERAL ANGIOGRAM;  Surgeon: Serafina Mitchell, MD;  Location: [facility] CATH LAB;  Service: Cardiovascular;  Laterality: Bilateral;  . VISCERAL ANGIOGRAM N/A 05/16/2012   Procedure: VISCERAL ANGIOGRAM;  Surgeon: Serafina Mitchell, MD;  Location: [facility] CATH LAB;  Service: Cardiovascular;  Laterality: N/A;  . VISCERAL  ANGIOGRAM N/A 02/13/2013   Procedure: VISCERAL ANGIOGRAM;  Surgeon: Serafina Mitchell, MD;  Location[facility] CATH LAB;  Service: Cardiovascular;  Laterality: N/A;    Current Outpatient Rx  . Order #: [id number] Class: Historical Med  . Order #: [id number] Class: Historical Med  . Order #: [id number] Class: Historical Med  . Order #: [id number] Class: Normal  . Order #: [id number] Class: Historical Med  . Order #: [id number] Class: Historical Med  . Order #: [id number] Class: Print  . Order #: [id number] Class: Historical Med  . Order #: [id number] Class: Historical Med  . Order #: [id number] Class: Historical Med  . Order #: [id number] Class: Historical Med  . Order #: [id number] Class: No Print  . Order #: [id number] Class: Normal  . Order #: [id number] Class: No Print  . Order #: [id number] Class: Historical Med  . Order #: [id number] Class: Normal  . Order #:  [id number] Class: Historical Med  . Order #: [id number] Class: Historical Med  . Order #: [id number] Class: Historical Med  . Order #: [id number] Class: Historical Med  . Order #: [id number] Class: Historical Med  . Order #: [id number] Class: Historical Med  . Order #: [id number] Class: Historical Med  . Order #: [id number] Class: Historical Med  . Order #: [id number] Class: Historical Med    Allergies Nsaids; Ibuprofen; and Ace inhibitors  Family History  Problem Relation Age of Onset  . Diabetes Mother   . Heart disease Mother     Heart Disease before age 72  . Hyperlipidemia Mother   . Hypertension Mother   . CVA Mother 26    cause of death  . Cancer Father     stomach/liver  . Hypertension Father     possibly hypertensive  . Cancer Sister     Breast cancer  . CAD Brother   . Heart disease Brother   . Cancer Paternal Uncle     colon  . Heart disease Sister   . Diabetes Sister   . Hypertension Sister   . Heart disease Daughter     Heart Disease before age 77  . Hypertension Daughter   . Heart attack Daughter     Social History Social History    Substance Use Topics  . Smoking status: Former Smoker    Years: 20.00    Types: Cigarettes    Quit date: 07/12/1969  . Smokeless tobacco: Never Used  . Alcohol use No    Review of Systems  Constitutional: No fever/chills. Positive generalized weakness.  Eyes: No visual changes. ENT: No sore throat. Cardiovascular: Positive pain between the shoulder blades.  Respiratory: Denies shortness of breath. Gastrointestinal: No abdominal pain.  No nausea, no vomiting.  No diarrhea.  No constipation. Genitourinary: Negative for dysuria. Musculoskeletal: Negative for back pain. Skin: Negative for rash. Neurological: Negative for headaches, focal weakness or numbness.  10-point ROS otherwise negative.  ____________________________________________   PHYSICAL EXAM:  VITAL SIGNS: ED Triage Vitals  Enc Vitals Group     BP 08/11/16 1310 163/68     Pulse Rate 08/11/16 1310 78     Resp 08/11/16 1310 20     Temp 08/11/16 1310 98 F (36.7 C)     Temp Source 08/11/16 1310 Oral     SpO2 08/11/16 1310 98 %     Weight 08/11/16 1311 200 lb (90.7 kg)     Height 08/11/16 1311 5\' 10"  (1.778 m)     Pain Score 08/11/16 1312 10   Constitutional: Chronically ill-appearing. Slumped in bed.  Eyes: Conjunctivae are normal.  Head: Atraumatic. }Nose: No congestion/rhinnorhea. Mouth/Throat: Mucous membranes are dry. Neck: No stridor. Cardiovascular: Normal rate, regular rhythm. Good peripheral circulation. Grossly normal heart sounds.   Respiratory: Normal respiratory effort.  No retractions. Lungs CTAB. Gastrointestinal: Soft and nontender. No distention.  Musculoskeletal: No lower extremity tenderness. Positive 3+ pitting edema. No gross deformities of extremities. Neurologic:  Normal speech and language. No gross focal neurologic deficits are appreciated.  Skin:  Skin is warm, dry and intact. No rash noted.  ____________________________________________   LABS (all labs ordered are listed, but  only abnormal results are displayed)  Labs Reviewed  COMPREHENSIVE METABOLIC PANEL - Abnormal; Notable for the following:       Result Value   Potassium 2.8 (*)    Chloride 100 (*)    Glucose, Bld 196 (*)    BUN 91 (*)    Creatinine, Ser 3.38 (*)    Calcium  8.4 (*)    Albumin 1.8 (*)    AST 46 (*)    GFR calc non Af Amer 16 (*)    GFR calc Af Amer 19 (*)    All other components within normal limits  LIPASE, BLOOD - Abnormal; Notable for the following:    Lipase 71 (*)    All other components within normal limits  CBC WITH DIFFERENTIAL/PLATELET - Abnormal; Notable for the following:    WBC 12.8 (*)    RBC 2.95 (*)    Hemoglobin 8.3 (*)    HCT 25.6 (*)    RDW 19.7 (*)    Neutro Abs 10.5 (*)    All other components within normal limits  BRAIN NATRIURETIC PEPTIDE - Abnormal; Notable for the following:    B Natriuretic Peptide 355.1 (*)    All other components within normal limits  URINALYSIS, ROUTINE W REFLEX MICROSCOPIC - Abnormal; Notable for the following:    APPearance CLOUDY (*)    Hgb urine dipstick MODERATE (*)    Protein, ur 30 (*)    Leukocytes, UA LARGE (*)    Bacteria, UA MANY (*)    All other components within normal limits  PROTIME-INR - Abnormal; Notable for the following:    Prothrombin Time 28.1 (*)    All other components within normal limits  INFLUENZA PANEL BY PCR (TYPE A & B)  AMMONIA  I-STAT TROPOININ, ED   ____________________________________________  EKG  EKG reviewed in MUSE. No STEMI.  ____________________________________________  RADIOLOGY  Dg Chest 2 View  Result Date: 08/11/2016 CLINICAL DATA:  Weakness, shortness of breath, chest pain EXAM: CHEST  2 VIEW COMPARISON:  Chest x-ray of 06/24/2016 FINDINGS: There is persistent basilar volume loss right greater than left, but aeration of the right lung base has improved. Small bilateral effusions remain, perhaps slightly decreased in volume. No pneumonia is seen. Cardiomegaly is stable. No  acute bony abnormality is noted. IMPRESSION: 1. Better aeration with decreasing right basilar atelectasis. 2. Slight decrease in size of small bilateral pleural effusions. Electronically Signed   By: Ivar Drape M.D.   On: 08/11/2016 14:19    ____________________________________________   PROCEDURES  Procedure(s) performed:   Procedures  None ____________________________________________   INITIAL IMPRESSION / ASSESSMENT AND PLAN / ED COURSE  Pertinent labs & imaging results that were available during my care of the patient were reviewed by me and considered in my medical decision making (see chart for details).  Patient resents to the emergent department for evaluation of generalized weakness, fatigue, and new onset pressure between the shoulder blades started yesterday evening. He was sent from the cardiology clinic for severe deconditioning and weakness. Family is discussing with palliative care and hospice by the patient is not enrolled as of yet.   Patient admitted by the cardiology service.  ____________________________________________  FINAL CLINICAL IMPRESSION(S) / ED DIAGNOSES  Final diagnoses:  Weakness  AKI (acute kidney injury) ([place])     MEDICATIONS GIVEN DURING THIS VISIT:  Medications  dextrose 5 % and 0.45 % NaCl with KCl 40 mEq/L infusion ( Intravenous New Bag/Given 08/11/16 1537)     NEW OUTPATIENT MEDICATIONS STARTED DURING THIS VISIT:  None   Note:  This document was prepared using Dragon voice recognition software and may include unintentional dictation errors.  Nanda Quinton, MD Emergency Medicine   Margette Fast, MD 08/11/16 [phone]

## 2016-08-11 NOTE — Progress Notes (Signed)
Notified [facility] md Koleen Nimrod re critical values. K-2.5.Marland KitchenMarland Kitchen trop-.07.no orders recieved

## 2016-08-11 NOTE — Progress Notes (Signed)
ANTICOAGULATION CONSULT NOTE - Initial Consult  Pharmacy Consult for coumadin Indication: afib and hx of DVT  Allergies  Allergen Reactions  . Nsaids Nausea Only    Reaction:  GI upset   . Ibuprofen Other (See Comments)    Reaction:  GI upset   . Ace Inhibitors Cough    Patient Measurements: Height: 5\' 10"  (177.8 cm) Weight: 185 lb 14.4 oz (84.3 kg) IBW/kg (Calculated) : 73   Vital Signs: Temp: 97.7 F (36.5 C) (01/31 1835) Temp Source: Oral (01/31 1835) BP: 181/83 (01/31 1835) Pulse Rate: 84 (01/31 1835)  Labs:  Recent Labs  08/11/16 1343  HGB 8.3*  HCT 25.6*  PLT 241  LABPROT 28.1*  INR 2.57  CREATININE 3.38*    Estimated Creatinine Clearance: 18.6 mL/min (by C-G formula based on SCr of 3.38 mg/dL (H)).   Medical History: Past Medical History:  Diagnosis Date  . Angiomyolipoma 2009   On both kidneys noted in 2009  . Arthritis    neck and left wrist  . Atrial fibrillation ([place])   . CAD (coronary artery disease)    s/b CABG 1994, and subsequent stents. Repeat CABG 12/2011,  . Chronic diastolic heart failure ([place])   . CKD (chronic kidney disease)   . Clostridium difficile diarrhea 03/2016  . Depressive disorder   . Diabetes mellitus type 2 with peripheral artery disease (HCC)    DIET CONTROLLED  . DVT (deep venous thrombosis) ([place]) 2011   Right arm  . Gastroparesis   . GERD (gastroesophageal reflux disease)   . Gout   . History of hiatal hernia   . Hyperlipidemia   . Hypertension   . Internal hemorrhoids without mention of complication   . Ischemic colitis ([place])   . Liddle's syndrome ([place])   . Myelodysplastic syndrome ([place]) 05/22/2013   With low hemoglobin and platelets treated with Procrit  . Osteopenia   . Peptic ulcer    S/p partial gastrectomy in 1969  . Peripheral artery disease ([place])   . Pneumonia 01/16/2016  . Pneumonia 03/2016  . Presence of permanent cardiac pacemaker   . Prostate cancer ([place]) 1997   XRT and lupron  . Renal artery  stenosis ([place])   . Sick sinus syndrome ([place])   . Vitamin B 12 deficiency     Assessment: 6 YOM from SNF with multiple medical problems admitted today with failure to thrive, weakness and new shoulder pain. He is on chronic coumadin for afib and DVT. Pharmacy is consulted to manage coumadin dosing. INR 2.57, therapeutic on admission. Hgb 8.3, pltc 241K.   PTA dose: 5 mg daily except for Sunday, takes 6 mg. Last dose was 1/30  Goal of Therapy:  INR 2-3 Monitor platelets by anticoagulation protocol: Yes   Plan:  - Coumadin 5mg  po x 1 - Daily PT/INR  Maryanna Shape, PharmD, BCPS  Clinical Pharmacist  Pager: [phone]   08/11/2016,6:40 PM

## 2016-08-12 ENCOUNTER — Inpatient Hospital Stay (HOSPITAL_COMMUNITY): Payer: Medicare Other

## 2016-08-12 DIAGNOSIS — R601 Generalized edema: Secondary | ICD-10-CM

## 2016-08-12 LAB — BASIC METABOLIC PANEL
Anion gap: 12 (ref 5–15)
BUN: 91 mg/dL — ABNORMAL HIGH (ref 6–20)
CO2: 23 mmol/L (ref 22–32)
Calcium: 7.9 mg/dL — ABNORMAL LOW (ref 8.9–10.3)
Chloride: 101 mmol/L (ref 101–111)
Creatinine, Ser: 3.18 mg/dL — ABNORMAL HIGH (ref 0.61–1.24)
GFR calc Af Amer: 20 mL/min — ABNORMAL LOW (ref >60)
GFR calc non Af Amer: 17 mL/min — ABNORMAL LOW (ref >60)
Glucose, Bld: 200 mg/dL — ABNORMAL HIGH (ref 65–99)
Potassium: 2.5 mmol/L — CL (ref 3.5–5.1)
Sodium: 136 mmol/L (ref 135–145)

## 2016-08-12 LAB — TROPONIN I
Troponin I: 0.06 ng/mL (ref <0.03)
Troponin I: 0.06 ng/mL (ref <0.03)

## 2016-08-12 LAB — PROTIME-INR
INR: 2.91
PROTHROMBIN TIME: 31.1 s — AB (ref 11.4–15.2)

## 2016-08-12 LAB — C DIFFICILE QUICK SCREEN W PCR REFLEX
C Diff antigen: POSITIVE — AB
C Diff interpretation: DETECTED
C Diff toxin: POSITIVE — AB

## 2016-08-12 MED ORDER — POTASSIUM CHLORIDE CRYS ER 20 MEQ PO TBCR
40.0000 meq | EXTENDED_RELEASE_TABLET | Freq: Two times a day (BID) | ORAL | Status: AC
  Administered 2016-08-12 – 2016-08-13 (×4): 40 meq via ORAL
  Filled 2016-08-12 (×4): qty 2

## 2016-08-12 MED ORDER — MUPIROCIN 2 % EX OINT
1.0000 "application " | TOPICAL_OINTMENT | Freq: Two times a day (BID) | CUTANEOUS | Status: DC
  Administered 2016-08-12 – 2016-08-16 (×9): 1 via NASAL
  Filled 2016-08-12 (×3): qty 22

## 2016-08-12 MED ORDER — CHLORHEXIDINE GLUCONATE CLOTH 2 % EX PADS
6.0000 | MEDICATED_PAD | Freq: Every day | CUTANEOUS | Status: AC
  Administered 2016-08-12 – 2016-08-16 (×5): 6 via TOPICAL

## 2016-08-12 MED ORDER — WARFARIN SODIUM 5 MG PO TABS
5.0000 mg | ORAL_TABLET | Freq: Once | ORAL | Status: AC
  Administered 2016-08-12: 5 mg via ORAL
  Filled 2016-08-12: qty 1

## 2016-08-12 MED ORDER — FUROSEMIDE 10 MG/ML IJ SOLN
40.0000 mg | Freq: Two times a day (BID) | INTRAMUSCULAR | Status: AC
  Administered 2016-08-12 – 2016-08-13 (×2): 40 mg via INTRAVENOUS
  Filled 2016-08-12 (×2): qty 4

## 2016-08-12 MED ORDER — GLUCERNA SHAKE PO LIQD
237.0000 mL | Freq: Two times a day (BID) | ORAL | Status: DC
  Administered 2016-08-13 – 2016-08-14 (×4): 237 mL via ORAL

## 2016-08-12 MED ORDER — JUVEN PO PACK
1.0000 | PACK | Freq: Two times a day (BID) | ORAL | Status: DC
  Administered 2016-08-12 – 2016-08-19 (×13): 1 via ORAL
  Filled 2016-08-12 (×15): qty 1

## 2016-08-12 NOTE — Progress Notes (Signed)
Progress Note  Patient Name: [name] GREGORI Sr. Date of Encounter: 08/12/2016  Primary Cardiologist: Dr. Tamala Julian  Subjective   Not feeling well without specific info. Intermittent shortness of breath. No chest pain.   Inpatient Medications    Scheduled Meds: . amiodarone  100 mg Oral Daily  . amLODipine  5 mg Oral Daily  . atorvastatin  80 mg Oral q1800  . carvedilol  25 mg Oral BID WC  . Chlorhexidine Gluconate Cloth  6 each Topical Q0600  . feeding supplement (ENSURE ENLIVE)  237 mL Oral BID BM  . ferrous sulfate  325 mg Oral TID WC  . furosemide  80 mg Oral BID  . hydrALAZINE  75 mg Oral Q6H  . isosorbide mononitrate  120 mg Oral Daily  . levothyroxine  50 mcg Oral QAC breakfast  . multivitamin with minerals  1 tablet Oral Daily  . mupirocin ointment  1 application Nasal BID  . potassium chloride  40 mEq Oral BID  . sodium chloride flush  3 mL Intravenous Q12H  . tamsulosin  0.4 mg Oral QPC supper  . Warfarin - Pharmacist Dosing Inpatient   Does not apply q1800   Continuous Infusions: . dextrose 5 % and 0.45 % NaCl with KCl 40 mEq/L 75 mL/hr at 08/12/16 0516   PRN Meds: sodium chloride, acetaminophen, ondansetron (ZOFRAN) IV, sodium chloride flush   Vital Signs    Vitals:   08/12/16 0500 08/12/16 0548 08/12/16 0832 08/12/16 0900  BP: (!) 162/64 (!) 143/50 131/64 (!) 120/51  Pulse: 77 77 83 79  Resp:  16 18   Temp:  98.7 F (37.1 C) 98.8 F (37.1 C)   TempSrc:  Oral Oral   SpO2:  95% 98%   Weight:  190 lb 1.6 oz (86.2 kg)    Height:        Intake/Output Summary (Last 24 hours) at 08/12/16 1051 Last data filed at 08/12/16 0815  Gross per 24 hour  Intake              600 ml  Output              450 ml  Net              150 ml   Filed Weights   08/11/16 1311 08/11/16 1835 08/12/16 0548  Weight: 200 lb (90.7 kg) 185 lb 14.4 oz (84.3 kg) 190 lb 1.6 oz (86.2 kg)    Telemetry    Sinus rhythm with PACs - Personally Reviewed  ECG    N/A  Physical  Exam   GEN: No acute distress.  Frail ill appearing male.  Neck: No JVD Cardiac: RRR, no murmurs, rubs, or gallops. 1+ BL LE edema Respiratory: Diminished breath sound with faint rales GI: Soft, nontender, non-distended  MS: No edema; No deformity. Neuro:  Nonfocal  Psych: Normal affect   Labs    Chemistry Recent Labs Lab 08/11/16 1343 08/11/16 1850 08/12/16 0704  NA 136 136 136  K 2.8* 2.5* 2.5*  CL 100* 100* 101  CO2 23 23 23   GLUCOSE 196* 171* 200*  BUN 91* 90* 91*  CREATININE 3.38* 3.27* 3.18*  CALCIUM 8.4* 8.0* 7.9*  PROT 7.3 7.0  --   ALBUMIN 1.8* 1.8*  --   AST 46* 41  --   ALT 17 15*  --   ALKPHOS 56 55  --   BILITOT 0.3 0.3  --   GFRNONAA 16* 17* 17*  GFRAA 19*  19* 20*  ANIONGAP 13 13 12      Hematology Recent Labs Lab 08/11/16 1343 08/11/16 1850  WBC 12.8* 14.2*  RBC 2.95* 2.93*  HGB 8.3* 8.1*  HCT 25.6* 25.1*  MCV 86.8 85.7  MCH 28.1 27.6  MCHC 32.4 32.3  RDW 19.7* 19.5*  PLT 241 261    Cardiac Enzymes Recent Labs Lab 08/11/16 1850 08/12/16 0120 08/12/16 0704  TROPONINI 0.07* 0.06* 0.06*    Recent Labs Lab 08/11/16 1354  TROPIPOC 0.06     BNP Recent Labs Lab 08/11/16 1343 08/11/16 1850  BNP 355.1* 319.0*     DDimer No results for input(s): DDIMER in the last 168 hours.   Radiology    Dg Chest 2 View  Result Date: 08/11/2016 CLINICAL DATA:  Weakness, shortness of breath, chest pain EXAM: CHEST  2 VIEW COMPARISON:  Chest x-ray of 06/24/2016 FINDINGS: There is persistent basilar volume loss right greater than left, but aeration of the right lung base has improved. Small bilateral effusions remain, perhaps slightly decreased in volume. No pneumonia is seen. Cardiomegaly is stable. No acute bony abnormality is noted. IMPRESSION: 1. Better aeration with decreasing right basilar atelectasis. 2. Slight decrease in size of small bilateral pleural effusions. Electronically Signed   By: Ivar Drape M.D.   On: 08/11/2016 14:19   Dg  Chest Port 1 View  Result Date: 08/12/2016 CLINICAL DATA:  Dyspnea, failure to thrive, history of atrial fibrillation, CHF, peripheral vascular disease, diabetes. EXAM: PORTABLE CHEST 1 VIEW COMPARISON:  PA and lateral chest x-ray of August 11, 2016 FINDINGS: The right lung is less well inflated today. Inflation of the left lung is reasonably well maintained. There is increased retrocardiac density on the left with obscuration of the hemidiaphragm. There has been previous CABG. The cardiac silhouette is enlarged. The pulmonary vascularity is mildly engorged. There is calcification in the wall of the aortic arch. The sternal wires are intact. The bony thorax exhibits no acute abnormality. IMPRESSION: Increasing density at the left lung base worrisome for atelectasis or pneumonia. Probable mild atelectatic change in the hypo inflated right lung. Cardiomegaly with central pulmonary vascular congestion slightly more conspicuous today. Thoracic aortic atherosclerosis. Electronically Signed   By: David  Martinique M.D.   On: 08/12/2016 07:39    Cardiac Studies   None  Patient Profile     81 y.o. male history of CAD status post CABG 1994 with subsequent stents and repeat CABG in 2013, hypertension, HLD, CKD, chronic diastolic heart failure, sinus bradycardia sp PPM (SJM) by Dr Leonia Reeves.Has PAF, CHADSVASC=5but is not anticoagulated for atrial fibrillation due to prior GI bleed now on coumadin, prior TV endocarditis with lead infection/device explanted and recurrent CHF exacerbation and a right upper arm DVT, multiple admission in past 6 months, recurring episodes of C. difficile enterocolitis, anasarca without ability to mobilize fluid as an outpatient who admitted from clinic due to failure to thrive.   Assessment & Plan    1. Chronic diastolic CHF - BNP minimally elevated to 319. CXR as above. Mild volume overload on exam Will defer diuretics per MD.   2. Failure to thrive - Requested Palliative care  consult as recommended by Dr. Tamala Julian.   3. PAF - CHADSVASc score of 5. Coumadin per pharmacy. On amido.   4. Elevated TSH - TSH has been elevated since 05/2016. Most recent 08/11/16 32.67. He is amiodarone as well. LFT normal. IM consult as recommended by Dr Tamala Julian? MD to review.   5. CKD - Baseline  creatinine around 2.8. Today 3.18.   6. Chronic anemia - Hgb 8.1. Seems stable.  7. Hypokalemia - K of 2.5 on IV supplement, will continue and give PO Kdur 57meq BID today.   Signed, Leanor Kail, PA  08/12/2016, 10:51 AM     The patient was seen, examined and discussed with Bhagat,Bhavinkumar PA-C and I agree with the above.   81 year old male with h/o CAD status post CABG 1994 with subsequent stents and repeat CABG in 2013, hypertension, HLD, CKD, chronic diastolic heart failure, sinus bradycardia sp PPM (SJM) by Dr Leonia Reeves.Has PAF, CHADSVASC=5but is not anticoagulated for atrial fibrillation due to prior GI bleed now on coumadin, prior TV endocarditis with lead infection/device explanted and recurrent CHF exacerbation and a right upper arm DVT, multiple admission in past 6 months, recurring episodes of C. difficile enterocolitis, anasarca without ability to mobilize fluid as an outpatient who admitted from clinic due to failure to thrive.  He appears comfortable and is only mildly fluid overloaded.He is on lasix 80 mg po BID at home, I will gove two doses lasix 40 mg iv Q12H and follow crea closely.  Palliative care consult to see.  [name], MD 08/12/2016

## 2016-08-12 NOTE — Progress Notes (Signed)
Page to [facility] with Cardiology. Regarding critical K+ @ 2.5

## 2016-08-12 NOTE — Progress Notes (Signed)
Just texted the [facility] regarding positive C-Diff. Awaiting response.

## 2016-08-12 NOTE — Progress Notes (Signed)
[facility] for coumadin Indication: afib and hx of DVT  Allergies  Allergen Reactions  . Nsaids Nausea Only    Reaction:  GI upset   . Ibuprofen Other (See Comments)    Reaction:  GI upset   . Ace Inhibitors Cough    Patient Measurements: Height: 5\' 10"  (177.8 cm) Weight: 190 lb 1.6 oz (86.2 kg) (bedscale) IBW/kg (Calculated) : 73   Vital Signs: Temp: 98.8 F (37.1 C) (02/01 1119) Temp Source: Oral (02/01 1119) BP: 140/54 (02/01 1119) Pulse Rate: 82 (02/01 1119)  Labs:  Recent Labs  08/11/16 1343 08/11/16 1850 08/12/16 0120 08/12/16 0704 08/12/16 0955  HGB 8.3* 8.1*  --   --   --   HCT 25.6* 25.1*  --   --   --   PLT 241 261  --   --   --   LABPROT 28.1* 24.3*  --   --  31.1*  INR 2.57 2.15  --   --  2.91  CREATININE 3.38* 3.27*  --  3.18*  --   TROPONINI  --  0.07* 0.06* 0.06*  --     Estimated Creatinine Clearance: 19.8 mL/min (by C-G formula based on SCr of 3.18 mg/dL (H)).  Assessment: 89 YOM from SNF with multiple medical problems admitted with failure to thrive, weakness and new shoulder pain. He is on chronic coumadin for afib and DVT. Pharmacy is consulted to manage coumadin dosing. INR remains therapeutic at 2.91. No bleeding noted.    PTA dose: 5 mg daily except for Sunday, takes 6 mg  Goal of Therapy:  INR 2-3 Monitor platelets by anticoagulation protocol: Yes   Plan:  Warfarin 5mg  PO x 1 tonight Daily INR  Salome Arnt, PharmD, BCPS Pager # [phone] 08/12/2016 1:00 PM

## 2016-08-12 NOTE — Progress Notes (Signed)
Initial Nutrition Assessment  DOCUMENTATION CODES:   Non-severe (moderate) malnutrition in context of chronic illness  INTERVENTION:  Provide Glucerna Shake po BID, each supplement provides 220 kcal and 10 grams of protein  Provide 1 packet Juven BID  Provide Multivitamin with minerals daily   NUTRITION DIAGNOSIS:   Malnutrition related to chronic illness, poor appetite as evidenced by energy intake < 75% for > or equal to 1 month, moderate depletion of body fat, mild depletion of muscle mass.   GOAL:   Patient will meet greater than or equal to 90% of their needs   MONITOR:   PO intake, Supplement acceptance, Skin, Weight trends, Labs, I & O's  REASON FOR ASSESSMENT:   Malnutrition Screening Tool    ASSESSMENT:   81 year old male with a history of CAD status post CABG 1994 with subsequent stents and repeat CABG in 2013, hypertension, HLD, CKD, chronic diastolic heart failure, and DM.   Pt reports having a poor appetite and eating poorly for a couple months now. He denies any abdominal pain, indigestions, or nausea. He states that he has been eating about 1/2 sandwich or equivalent amount of food at each meal. He drinks Glucerna supplements sometimes at home. Per nursing notes pt at 25% of breakfast and 25% of lunch today, 75% of dinner last night. Per nutrition-focused physical exam pt has moderate edema, moderate fat wasting and mild muscle wasting. Suspect that edema is masking some wasting. There is no evidence of weight loss based on weight history from the past 3 to 4 months. Pt agreeable to drinking Glucerna Shakes in between meals throughout admission.   Labs: low potassium, elevated glucose (200 mg/dL), high BUN, low calcium, low GFR, High PT, low magnesium  Diet Order:  Diet Heart Room service appropriate? Yes; Fluid consistency: Thin  Skin:  Wound (see comment) (stage III pressure injury on sacrum)  Last BM:  2/1  Height:   Ht Readings from Last 1  Encounters:  08/11/16 5\' 10"  (1.778 m)    Weight:   Wt Readings from Last 1 Encounters:  08/12/16 190 lb 1.6 oz (86.2 kg)    Ideal Body Weight:  75.45 kg  BMI:  Body mass index is 27.28 kg/m.  Estimated Nutritional Needs:   Kcal:  1900-2100  Protein:  100-115 grams  Fluid:  1.9-2.1 L/day  EDUCATION NEEDS:   No education needs identified at this time  [place], CSP, LDN Inpatient Clinical Dietitian Pager: [phone] After Hours Pager: [phone]

## 2016-08-13 DIAGNOSIS — Z7189 Other specified counseling: Secondary | ICD-10-CM

## 2016-08-13 DIAGNOSIS — E440 Moderate protein-calorie malnutrition: Secondary | ICD-10-CM | POA: Insufficient documentation

## 2016-08-13 DIAGNOSIS — I5032 Chronic diastolic (congestive) heart failure: Secondary | ICD-10-CM

## 2016-08-13 DIAGNOSIS — Z515 Encounter for palliative care: Secondary | ICD-10-CM

## 2016-08-13 LAB — BASIC METABOLIC PANEL
Anion gap: 13 (ref 5–15)
BUN: 96 mg/dL — ABNORMAL HIGH (ref 6–20)
CO2: 21 mmol/L — ABNORMAL LOW (ref 22–32)
Calcium: 8 mg/dL — ABNORMAL LOW (ref 8.9–10.3)
Chloride: 101 mmol/L (ref 101–111)
Creatinine, Ser: 3.26 mg/dL — ABNORMAL HIGH (ref 0.61–1.24)
GFR calc Af Amer: 19 mL/min — ABNORMAL LOW (ref >60)
GFR calc non Af Amer: 17 mL/min — ABNORMAL LOW (ref >60)
Glucose, Bld: 200 mg/dL — ABNORMAL HIGH (ref 65–99)
Potassium: 3.5 mmol/L (ref 3.5–5.1)
Sodium: 135 mmol/L (ref 135–145)

## 2016-08-13 LAB — PROTIME-INR
INR: 2.92
Prothrombin Time: 31.1 seconds — ABNORMAL HIGH (ref 11.4–15.2)

## 2016-08-13 MED ORDER — HYDROMORPHONE HCL 1 MG/ML IJ SOLN
0.2500 mg | INTRAMUSCULAR | Status: DC | PRN
  Administered 2016-08-15: 0.5 mg via INTRAVENOUS
  Administered 2016-08-16: 0.25 mg via INTRAVENOUS
  Administered 2016-08-18 – 2016-08-19 (×2): 0.5 mg via INTRAVENOUS
  Filled 2016-08-13 (×4): qty 1

## 2016-08-13 MED ORDER — VANCOMYCIN 50 MG/ML ORAL SOLUTION
125.0000 mg | Freq: Four times a day (QID) | ORAL | Status: DC
  Administered 2016-08-13 – 2016-08-19 (×24): 125 mg via ORAL
  Filled 2016-08-13 (×27): qty 2.5

## 2016-08-13 MED ORDER — ENSURE ENLIVE PO LIQD
237.0000 mL | Freq: Two times a day (BID) | ORAL | Status: DC
  Administered 2016-08-13 – 2016-08-18 (×8): 237 mL via ORAL

## 2016-08-13 MED ORDER — WARFARIN SODIUM 5 MG PO TABS
5.0000 mg | ORAL_TABLET | Freq: Once | ORAL | Status: AC
  Administered 2016-08-13: 5 mg via ORAL
  Filled 2016-08-13: qty 1

## 2016-08-13 NOTE — Clinical Social Work Note (Signed)
Clinical Social Work Assessment  Patient Details  Name: [name] SIGAL Sr. MRN: [id number] Date of Birth: 1936-01-20  Date of referral:  08/13/16               Reason for consult:  Facility Placement, Discharge Planning, End of Life/Hospice                Permission sought to share information with:  Facility Sport and exercise psychologist, Family Supports Permission granted to share information::  Yes, Verbal Permission Granted  Name::     [place]::  [facility]  Relationship::  Wife  Contact Information:  [phone]  Housing/Transportation Living arrangements for the past 2 months:  [place] of Information:  Patient, Medical Team, Adult Children, Spouse Patient Interpreter Needed:  None Criminal Activity/Legal Involvement Pertinent to Current Situation/Hospitalization:  No - Comment as needed Significant Relationships:  Adult Children, Spouse Lives with:  Facility Resident Do you feel safe going back to the place where you live?  No Need for family participation in patient care:  Yes (Comment)  Care giving concerns:  Palliative team recommending residential hospice.   Social Worker assessment / plan:  CSW met with patient. Wife and family at bedside. CSW introduced role and explained that inquired about interest in [facility]. Patient and his family confirmed. Second choice is [facility] does not have a bed for several days. CSW called and made referral to admissions coordinator. She has set up a meeting with patient and his family at 1:00 today. No further concerns. CSW encouraged patient and his family to contact CSW as needed. CSW will continue to follow patient and his family for support and facilitate discharge to hospice once medically stable.  Employment status:  Retired Nurse, adult PT Recommendations:  Not assessed at this time Information / Referral to community resources:   Other (Comment Required) (Residential Hospice facilties)  Patient/Family's Response to care:  Patient and his family agreeable to residential hospice. Patient's family supportive and involved in patient's care. Patient and his family appreciated social work intervention.  Patient/Family's Understanding of and Emotional Response to Diagnosis, Current Treatment, and Prognosis:  Patient and his family have a good understanding of reason for admission and prognosis. Patient and his family appear happy with hospital care.  Emotional Assessment Appearance:  Appears stated age Attitude/Demeanor/Rapport:  Lethargic Affect (typically observed):  Accepting Orientation:  Oriented to Self, Oriented to Place, Oriented to  Time, Oriented to Situation Alcohol / Substance use:  Never Used Psych involvement (Current and /or in the community):  No (Comment)  Discharge Needs  Concerns to be addressed:  Care Coordination Readmission within the last 30 days:  No Current discharge risk:  Terminally ill Barriers to Discharge:  No Barriers Identified   [name], LCSW 08/13/2016, 11:16 AM

## 2016-08-13 NOTE — Consult Note (Signed)
[facility] Referral received from Felicita Gage, SW for Hospice Home at [facility]. Met with spouse, Knute Neu, sons, daughters, and several grandchildren to discuss referral. Reviewed hospice philosophy and scope of services that are consistent with stated Goals of Care.  Medical Record reviewed with Dr. Beverlyn Roux, who did not approve the patient for Hospice Home at this time. The patient is eating, drinking, able to take his meds by mouth, and does not demonstrate a symptom burden at this time. Discussed findings with spouse, Knute Neu, and advised her that if the patient suffers significant decline prior to discharge, [facility] [place] is available to re-evaluate for Hospice Home. Felicita Gage, SW notified.  Yetta Glassman RN [place] Cell: [phone]

## 2016-08-13 NOTE — Clinical Social Work Note (Addendum)
Spoke with admissions coordinator with [facility]. Patient does not meet requirements for inpatient hospice at this time. Will notify palliative.  Dayton Scrape, [place] [phone]  4:12 pm Per admissions coordinator at [facility], patient would not have to pay any different than he is now for hospice care at the facility. Patient's wife is agreeable. FL2 faxed to SNF.  Dayton Scrape, [place]

## 2016-08-13 NOTE — Progress Notes (Signed)
ANTICOAGULATION CONSULT NOTE - Follow Up Consult  Pharmacy Consult for Coumadin Indication: afib and hx of DVT  Allergies  Allergen Reactions  . Nsaids Nausea Only    Reaction:  GI upset   . Ibuprofen Other (See Comments)    Reaction:  GI upset   . Ace Inhibitors Cough    Patient Measurements: Height: 5\' 10"  (177.8 cm) Weight: 186 lb 8 oz (84.6 kg) IBW/kg (Calculated) : 73   Vital Signs: Temp: 99 F (37.2 C) (02/02 0504) Temp Source: Oral (02/02 0504) BP: 138/67 (02/02 0504) Pulse Rate: 86 (02/02 0504)  Labs:  Recent Labs  08/11/16 1343 08/11/16 1850 08/12/16 0120 08/12/16 0704 08/12/16 0955 08/13/16 0448  HGB 8.3* 8.1*  --   --   --   --   HCT 25.6* 25.1*  --   --   --   --   PLT 241 261  --   --   --   --   LABPROT 28.1* 24.3*  --   --  31.1* 31.1*  INR 2.57 2.15  --   --  2.91 2.92  CREATININE 3.38* 3.27*  --  3.18*  --  3.26*  TROPONINI  --  0.07* 0.06* 0.06*  --   --     Estimated Creatinine Clearance: 19.3 mL/min (by C-G formula based on SCr of 3.26 mg/dL (H)).  Assessment: 80yom continues on coumadin for afib and hx DVT. INR therapeutic at 2.91. No bleeding.   Goal of Therapy:  INR 2-3 Monitor platelets by anticoagulation protocol: Yes   Plan:  1) Coumadin 5mg  x 1 2) Daily INR 3) Follow up treatment for + Cdiff  Deboraha Sprang 08/13/2016,10:00 AM

## 2016-08-13 NOTE — NC FL2 (Signed)
[place] LEVEL OF CARE SCREENING TOOL     IDENTIFICATION  Patient Name: [name] GARGAN Sr. Birthdate: [birth year] Sex: male Admission Date (Current Location): 08/11/2016  [facility] and [place] Number:  Herbalist and Address:  [facility] [place], [place] [address] [place], [place] [address]      Provider Number: [id number]  Attending Physician Name and Address:  Belva Crome, MD  Relative Name and Phone Number:       Current Level of Care: Hospital Recommended Level of Care: [place] (with hospice) Prior Approval Number:    Date Approved/Denied:   PASRR Number: [id number]  Discharge Plan: SNF (with hospice)    Current Diagnoses: Patient Active Problem List   Diagnosis Date Noted  . Malnutrition of moderate degree 08/13/2016  . Palliative care by specialist   . Failure to thrive (0-17) 08/11/2016  . Serratia marcescens infection ([place]) 07/31/2016  . Foreskin does not retract 07/31/2016  . Ulcer of urethral meatus 07/31/2016  . Hyperlipidemia 07/06/2016  . BPH (benign prostatic hyperplasia) 07/06/2016  . Acute back pain 06/24/2016  . Acute on chronic diastolic CHF (congestive heart failure) ([place]) 06/24/2016  . Debility 06/24/2016  . Leg weakness, bilateral 06/24/2016  . Pseudomonas urinary tract infection 06/24/2016  . Fall 06/24/2016  . Warfarin anticoagulation 06/24/2016  . Encounter for therapeutic drug monitoring 06/07/2016  . Pressure injury of skin 05/27/2016  . Acute deep vein thrombosis (DVT) of right upper extremity ([place]) 05/25/2016  . History of pacemaker 05/03/2016  . Anemia, iron deficiency 04/16/2016  . Endocarditis of tricuspid valve   . C. difficile colitis 04/10/2016  . HCAP (healthcare-associated pneumonia) 04/08/2016  . Chronic kidney disease, stage III (moderate) 03/09/2016  . Pressure ulcer 02/23/2016  . Seizure ([place]) 02/20/2016  . Palliative care encounter   . Goals of care,  counseling/discussion   . Pacemaker infection ([place])   . Acute renal failure superimposed on stage 4 chronic kidney disease ([place]) 01/26/2016  . Bacteremia due to Staphylococcus aureus   . Pseudogout involving multiple joints 01/24/2016  . Bladder mass 01/24/2016  . Hemoptysis   . Pain   . Left upper extremity swelling   . Staphylococcus aureus bacteremia 01/17/2016  . CAP (community acquired pneumonia) 01/16/2016  . S/P hernia repair 10/28/2015  . Right inguinal hernia 09/09/2015  . GIB (gastrointestinal bleeding) 08/29/2015  . Hematuria 05/19/2015  . Thrombocytopenia ([place]) 11/11/2014  . Leukopenia 11/11/2014  . B12 deficiency 08/31/2013  . Pacemaker 08/26/2013  . On amiodarone therapy 08/21/2013  . Gout due to renal impairment, left ankle and foot 06/13/2013    Class: Acute  . CAD of autologous bypass graft 06/05/2013  . MDS (myelodysplastic syndrome) ([place]) 05/22/2013  . Anemia of chronic disease 03/19/2013  . CKD (chronic kidney disease), stage IV ([place]) 03/18/2013  . NSVT (nonsustained ventricular tachycardia) ([place]) 03/18/2013  . Chronic diastolic heart failure ([place]) 03/17/2013  . Diabetes mellitus ([place]) 03/17/2013  . Essential hypertension 03/17/2013  . Mesenteric artery stenosis ([place]) 05/01/2012  . PAF (paroxysmal atrial fibrillation) ([place]) 12/24/2011  . PAD (peripheral artery disease) ([place]) 12/24/2011  . Chronic vascular insufficiency of intestine (H[facility]) 12/20/2011  . Personal history of DVT (deep vein thrombosis) 07/12/2009    Orientation RESPIRATION BLADDER Height & Weight     Self, Time, Situation, Place  Normal Incontinent Weight: 186 lb 8 oz (84.6 kg) Height:  5\' 10"  (177.8 cm)  BEHAVIORAL SYMPTOMS/MOOD NEUROLOGICAL BOWEL NUTRITION STATUS   (None) Convulsions/Seizures Continent  Diet (Regular)  AMBULATORY STATUS COMMUNICATION OF NEEDS Skin     Verbally PU Stage and Appropriate Care     PU Stage 3 Dressing:  (Posterior sacrum: Barrier cream)                  Personal Care Assistance Level of Assistance              Functional Limitations Info  Sight, Hearing, Speech Sight Info: Adequate Hearing Info: Adequate Speech Info: Adequate    SPECIAL CARE FACTORS FREQUENCY  Blood pressure                    Contractures Contractures Info: Not present    Additional Factors Info  Code Status, Allergies, Isolation Precautions Code Status Info: DNR Allergies Info: Nsaids, Ibuprofen, Ace Inhibitors     Isolation Precautions Info: Contact/Enteric: MRSA     Current Medications (08/13/2016):  This is the current hospital active medication list Current Facility-Administered Medications  Medication Dose Route Frequency Provider Last Rate Last Dose  . 0.9 %  sodium chloride infusion  250 mL Intravenous PRN Burtis Junes, NP      . acetaminophen (TYLENOL) tablet 650 mg  650 mg Oral Q4H PRN Burtis Junes, NP      . amiodarone (PACERONE) tablet 100 mg  100 mg Oral Daily Burtis Junes, NP   100 mg at 08/13/16 0925  . amLODipine (NORVASC) tablet 5 mg  5 mg Oral Daily Burtis Junes, NP   5 mg at 08/13/16 0926  . carvedilol (COREG) tablet 25 mg  25 mg Oral BID WC Burtis Junes, NP   25 mg at 08/13/16 [id number]  . Chlorhexidine Gluconate Cloth 2 % PADS 6 each  6 each Topical Q0600 Belva Crome, MD   6 each at 08/13/16 [phone]  . dextrose 5 % and 0.45 % NaCl with KCl 40 mEq/L infusion   Intravenous Continuous Margette Fast, MD 75 mL/hr at 08/13/16 1215    . feeding supplement (ENSURE ENLIVE) (ENSURE ENLIVE) liquid 237 mL  237 mL Oral BID BM Belva Crome, MD   237 mL at 08/13/16 1235  . feeding supplement (GLUCERNA SHAKE) (GLUCERNA SHAKE) liquid 237 mL  237 mL Oral BID BM Belva Crome, MD   237 mL at 08/13/16 1235  . hydrALAZINE (APRESOLINE) tablet 75 mg  75 mg Oral Q6H Burtis Junes, NP   75 mg at 08/13/16 0925  . HYDROmorphone (DILAUDID) injection 0.25-0.5 mg  0.25-0.5 mg Intravenous Q4H PRN Jannette Fogo, NP      . isosorbide  mononitrate (IMDUR) 24 hr tablet 120 mg  120 mg Oral Daily Burtis Junes, NP   120 mg at 08/13/16 0925  . levothyroxine (SYNTHROID, LEVOTHROID) tablet 50 mcg  50 mcg Oral QAC breakfast Burtis Junes, NP   50 mcg at 08/13/16 0603  . mupirocin ointment (BACTROBAN) 2 % 1 application  1 application Nasal BID Belva Crome, MD   1 application at [id number] [phone]  . nutrition supplement (JUVEN) (JUVEN) powder packet 1 packet  1 packet Oral BID WC Belva Crome, MD   1 packet at 08/13/16 [phone]  . ondansetron (ZOFRAN) injection 4 mg  4 mg Intravenous Q6H PRN Burtis Junes, NP      . potassium chloride SA (K-DUR,KLOR-CON) CR tablet 40 mEq  40 mEq Oral BID Bhavinkumar Bhagat, PA   40 mEq at 08/13/16 0926  . sodium chloride flush (  NS) 0.9 % injection 3 mL  3 mL Intravenous PRN Burtis Junes, NP      . sodium chloride flush (NS) 0.9 % injection 3 mL  3 mL Intravenous Q12H Burtis Junes, NP   3 mL at 08/13/16 0927  . tamsulosin (FLOMAX) capsule 0.4 mg  0.4 mg Oral QPC supper Burtis Junes, NP   0.4 mg at 08/12/16 1810  . vancomycin (VANCOCIN) 50 mg/mL oral solution 125 mg  125 mg Oral QID Jannette Fogo, NP   125 mg at 08/13/16 1236  . warfarin (COUMADIN) tablet 5 mg  5 mg Oral ONCE-1800 [place], [facility]      . Warfarin - Pharmacist Dosing Inpatient   Does not apply q1800 Belva Crome, MD         Discharge Medications: Please see discharge summary for a list of discharge medications.  Relevant Imaging Results:  Relevant Lab Results:   Additional Information SS#: [id number]  [name], LCSW

## 2016-08-13 NOTE — Progress Notes (Signed)
Progress Note  Patient Name: [name] GOETTER Sr. Date of Encounter: 08/13/2016  Primary Cardiologist: Dr. Tamala Julian  Subjective   Pt states he still feels weak with little improvement from yesterday.  Inpatient Medications    Scheduled Meds: . amiodarone  100 mg Oral Daily  . amLODipine  5 mg Oral Daily  . atorvastatin  80 mg Oral q1800  . carvedilol  25 mg Oral BID WC  . Chlorhexidine Gluconate Cloth  6 each Topical Q0600  . feeding supplement (GLUCERNA SHAKE)  237 mL Oral BID BM  . ferrous sulfate  325 mg Oral TID WC  . furosemide  40 mg Intravenous BID  . hydrALAZINE  75 mg Oral Q6H  . isosorbide mononitrate  120 mg Oral Daily  . levothyroxine  50 mcg Oral QAC breakfast  . multivitamin with minerals  1 tablet Oral Daily  . mupirocin ointment  1 application Nasal BID  . nutrition supplement (JUVEN)  1 packet Oral BID WC  . potassium chloride  40 mEq Oral BID  . sodium chloride flush  3 mL Intravenous Q12H  . tamsulosin  0.4 mg Oral QPC supper  . Warfarin - Pharmacist Dosing Inpatient   Does not apply q1800   Continuous Infusions: . dextrose 5 % and 0.45 % NaCl with KCl 40 mEq/L 75 mL/hr at 08/12/16 2242   PRN Meds: sodium chloride, acetaminophen, ondansetron (ZOFRAN) IV, sodium chloride flush   Vital Signs    Vitals:   08/12/16 1301 08/12/16 2037 08/13/16 0054 08/13/16 0504  BP: (!) 167/63 (!) 135/57 (!) 129/57 138/67  Pulse:  80 82 86  Resp:  18 18 18   Temp:  99 F (37.2 C) 99.1 F (37.3 C) 99 F (37.2 C)  TempSrc:  Oral Oral Oral  SpO2:  96% 93% 96%  Weight:    186 lb 8 oz (84.6 kg)  Height:        Intake/Output Summary (Last 24 hours) at 08/13/16 0840 Last data filed at 08/13/16 [id number]  Gross per 24 hour  Intake          4358.75 ml  Output              301 ml  Net          4057.75 ml   Filed Weights   08/11/16 1835 08/12/16 0548 08/13/16 0504  Weight: 185 lb 14.4 oz (84.3 kg) 190 lb 1.6 oz (86.2 kg) 186 lb 8 oz (84.6 kg)    Telemetry    NSR in  the 90s - Personally Reviewed  ECG    No new tracings  Physical Exam   General: Well developed, frail appearing, oriented and answers questions appropriately  Head: Normocephalic, atraumatic.  Neck: Supple without bruits, no JVD Lungs:  Resp regular and unlabored, CTA but diminished in bases Heart: RRR, S1, S2, no murmur; no rub. Abdomen: Soft, non-tender, non-distended with normoactive bowel sounds. No hepatomegaly. No rebound/guarding. No obvious abdominal masses. Tender to palpation in right lower quadrant near groin Extremities: No clubbing, cyanosis, 2+ edema. Distal pedal pulses are faint bilaterally, LE warm Neuro: Alert and oriented X 3. Moves all extremities spontaneously. Psych: Normal affect.  Labs    Chemistry Recent Labs Lab 08/11/16 1343 08/11/16 1850 08/12/16 0704 08/13/16 0448  NA 136 136 136 135  K 2.8* 2.5* 2.5* 3.5  CL 100* 100* 101 101  CO2 23 23 23  21*  GLUCOSE 196* 171* 200* 200*  BUN 91* 90* 91* 96*  CREATININE  3.38* 3.27* 3.18* 3.26*  CALCIUM 8.4* 8.0* 7.9* 8.0*  PROT 7.3 7.0  --   --   ALBUMIN 1.8* 1.8*  --   --   AST 46* 41  --   --   ALT 17 15*  --   --   ALKPHOS 56 55  --   --   BILITOT 0.3 0.3  --   --   GFRNONAA 16* 17* 17* 17*  GFRAA 19* 19* 20* 19*  ANIONGAP 13 13 12 13      Hematology Recent Labs Lab 08/11/16 1343 08/11/16 1850  WBC 12.8* 14.2*  RBC 2.95* 2.93*  HGB 8.3* 8.1*  HCT 25.6* 25.1*  MCV 86.8 85.7  MCH 28.1 27.6  MCHC 32.4 32.3  RDW 19.7* 19.5*  PLT 241 261    Cardiac Enzymes Recent Labs Lab 08/11/16 1850 08/12/16 0120 08/12/16 0704  TROPONINI 0.07* 0.06* 0.06*    Recent Labs Lab 08/11/16 1354  TROPIPOC 0.06     BNP Recent Labs Lab 08/11/16 1343 08/11/16 1850  BNP 355.1* 319.0*     DDimer No results for input(s): DDIMER in the last 168 hours.   Radiology    Dg Chest 2 View  Result Date: 08/11/2016 CLINICAL DATA:  Weakness, shortness of breath, chest pain EXAM: CHEST  2 VIEW COMPARISON:   Chest x-ray of 06/24/2016 FINDINGS: There is persistent basilar volume loss right greater than left, but aeration of the right lung base has improved. Small bilateral effusions remain, perhaps slightly decreased in volume. No pneumonia is seen. Cardiomegaly is stable. No acute bony abnormality is noted. IMPRESSION: 1. Better aeration with decreasing right basilar atelectasis. 2. Slight decrease in size of small bilateral pleural effusions. Electronically Signed   By: Ivar Drape M.D.   On: 08/11/2016 14:19   Dg Chest Port 1 View  Result Date: 08/12/2016 CLINICAL DATA:  Dyspnea, failure to thrive, history of atrial fibrillation, CHF, peripheral vascular disease, diabetes. EXAM: PORTABLE CHEST 1 VIEW COMPARISON:  PA and lateral chest x-ray of August 11, 2016 FINDINGS: The right lung is less well inflated today. Inflation of the left lung is reasonably well maintained. There is increased retrocardiac density on the left with obscuration of the hemidiaphragm. There has been previous CABG. The cardiac silhouette is enlarged. The pulmonary vascularity is mildly engorged. There is calcification in the wall of the aortic arch. The sternal wires are intact. The bony thorax exhibits no acute abnormality. IMPRESSION: Increasing density at the left lung base worrisome for atelectasis or pneumonia. Probable mild atelectatic change in the hypo inflated right lung. Cardiomegaly with central pulmonary vascular congestion slightly more conspicuous today. Thoracic aortic atherosclerosis. Electronically Signed   By: David  Martinique M.D.   On: 08/12/2016 07:39    Cardiac Studies   TEE Study Conclusions from 04/2016  - Left ventricle: Wall thickness was increased in a pattern of moderate LVH. Systolic function was normal. The estimated ejection fraction was in the range of 55% to 60%. Wall motion was normal; there were no regional wall motion abnormalities. - Aortic valve: There was trivial regurgitation. - Aorta:  Severe non-mobile atherosclerotic plaque. - Ascending aorta: The ascending aorta was normal in size. - Mitral valve: There was mild regurgitation. - Left atrium: The atrium was dilated. No evidence of thrombus in the atrial cavity or appendage. - Right ventricle: Systolic function was normal. - Right atrium: No evidence of thrombus in the atrial cavity or appendage. - Atrial septum: No defect or patent foramen ovale was  identified. - Tricuspid valve: There was mild-moderate regurgitation.  Impressions:  - Tricuspid valve is significantly thickened, predominantly the posterior leaflet. There is a mobile echodensity measuring 1.6 x 1.5 cm attached to the posterior leaflet suspicious for a vegetation.   TTE Study Conclusions from 03/2016  - Left ventricle: The cavity size was normal. Wall thickness was increased in a pattern of moderate LVH. Systolic function was normal. The estimated ejection fraction was in the range of 55% to 60%. - Aortic valve: Valve area (VTI): 1.63 cm^2. Valve area (Vmax): 1.56 cm^2. Valve area (Vmean): 1.63 cm^2. - Mitral valve: There was mild regurgitation. - Left atrium: The atrium was mildly dilated. - Right atrium: Appears to be retained RA lead in RA by history the RV lead has been removed from heart - Atrial septum: No defect or patent foramen ovale was identified. - Tricuspid valve: Cannot r/o vegetation beneath septal leaflet of TV in RVOT seen best on basal SA images May represent chordae but would suggest f/u TEE. - Pulmonary arteries: PA peak pressure: 53 mm Hg (S). - Pericardium, extracardiac: There was a left pleural effusion.  Patient Profile     81 y.o. male with PMH of CAD status post CABG 1994 with subsequent stents and repeat CABG in 2013, hypertension, HLD, CKD, chronic diastolic heart failure, sinus bradycardia sp PPM (SJM) by Dr Leonia Reeves.Has PAF, CHADSVASC=5but is not anticoagulated for atrial fibrillation  due to prior GI bleed now on coumadin, prior TV endocarditis with lead infection/device explanted and recurrent CHF exacerbation and a right upper arm DVT, multiple admission in past 6 months, recurring episodes of C. difficile enterocolitis, anasarca without ability to mobilize fluid as an outpatient who admitted from clinic due to failure to thrive.   Assessment & Plan    1. Chronic diastolic CHF - BNP minimally elevated to 319.  - 40 mg IV lasix x 1 yesterday  - 300 cc output + 6 occurrences   - weight down 186 lbs (190 lbs) - sCr 3.26 (3.18) - will discuss further diuresis with attending   2. Failure to thrive - Requested Palliative care consult as recommended by Dr. Tamala Julian.  - added ensure to deit   3. PAF - CHADSVASc score of 5. Coumadin per pharmacy. - continue amiodarone, coreg   4. Elevated TSH - TSH has been elevated since 05/2016. Most recent 08/11/16 32.67. He is amiodarone as well. LFT normal. IM consult?    5. CKD - Baseline creatinine around 2.8. Today 3.26.    6. Chronic anemia - Hgb 8.3, stable.   7. Hypokalemia - K of 3.5  - will continue and give PO Kdur 36meq BID today.    Signed, Minette Brine , PA-C 8:40 AM 08/13/2016  The patient was seen, examined and discussed with Minette Brine , PA-C and I agree with the above.   81 year old male with h/o CAD status post CABG 1994 with subsequent stents and repeat CABG in 2013, hypertension, HLD, CKD, chronic diastolic heart failure, sinus bradycardia sp PPM (SJM) by Dr Leonia Reeves.Has PAF, CHADSVASC=5but is not anticoagulated for atrial fibrillation due to prior GI bleed now on coumadin, prior TV endocarditis with lead infection/device explanted and recurrent CHF exacerbation and a right upper arm DVT, multiple admission in past 6 months, recurring episodes of C. difficile enterocolitis, anasarca without ability to mobilize fluid as an outpatient who admitted from clinic due to failure to thrive.  The patient  has multiorgan failure and now is failing to  thrive, Palliative care is taking over for comfort care, we will sign off, please call us with any questions.  [name], MD 08/13/2016

## 2016-08-13 NOTE — Progress Notes (Signed)
Recheck temperature 101.1 after tylenol given.  On call paged. Will continue to monitor.

## 2016-08-13 NOTE — Consult Note (Signed)
Consultation Note Date: 08/13/2016   Patient Name: [name]  DOB: Jan 26, 1936  MRN: [id number]  Age / Sex: 81 y.o., male  PCP: Hulan Fess, MD Referring Physician: Belva Crome, MD  Reason for Consultation: Establishing goals of care and Psychosocial/spiritual support  HPI/Patient Profile: 81 y.o. 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 Diff, DVT in RUE, hypothyroidism, and failure to thrive who presented to his cardiologist office for follow-up after they increased his diuretic and was found to be extremely weak, decreased alertness, and poor oral intake. He was subsequently admitted on 08/11/2016 for failure to thrive. The plan with admission is to attempt to get him feeling better, as well as facilitate a meeting with palliative care to discuss goals of care.   Clinical Assessment and Goals of Care: I met with [name] and multiple members of his family (his mother, multiple children, and two grandchildren). They have spoken with another member of my team last year, and were familiar with our service. They also had advanced directive paperwork with them that they had all ready through.  When I started talking with [name] he was able to articulate the gist of the conversation he had with Cardiology prior to admission. He understood his heart failure was getting worst, his symptoms were increasing, and he had to think about how he wanted to spend whatever time he has left. I expanded on these things with his family, explaining the progressive nature of heart failure and the relationship it had with his weakness and poor appetite. In explaining these things [name] verbalized a desire to focus on being comfortable. I asked him to explain what that might look like; he wanted good time with his family, no pain, and to pass peacefully  when his time comes.   In order to meet his goal of comfort I brought up the option of Hospice. I explained their philosophy of care, and discussed the difference between their care approach and the hospital's (namely, no labs, fluids, imaging, etc. But would look at him and focus on keeping him feeling comfortable). I reiterated that Hospice did mean end of life care. He and his family were in full agreement that Hospice would be the best option for him at this point. Given his increasing symptom burden with minimal intake and significant weakness, I recommended residential hospice placement. His family was in agreement with that.    Of note, he did not want to discuss prognosis, but was accepting that he was reaching the end of his life. I did speak with one of his daughters outside of the room, per her request, and shared that I thought he had weeks to months. I based this on his significant and escalating symptom burden, weakness, and minimal oral intake with electrolyte abnormalities.   Primary Decision Maker PATIENT   SUMMARY OF RECOMMENDATIONS    DNR, full comfort care, no further labs/procedures/invasive interventions  SW consulted for residential hospice placement ([facility])  Symptoms:   Pain/dyspnea: start PRN IV dilaudid (avoid morphine d/t renal fxn and risk for toxic metabolite build-up)  C.Diff: Will start oral vancomycin (I consider this a palliative intervention)  **ADDENDUM: [facility] evaluated and felt he was not a candidate for residential. I did call to discuss this with them further. SW contacted and is investigating cost of SNF with Palliative versus SNF with Hospice. I  will then plan to discuss these options with the family to determine what is financially feasible.   Code Status/Advance Care Planning:  DNR  Palliative Prophylaxis:   Frequent Pain Assessment  Additional Recommendations (Limitations, Scope, Preferences):  Full Comfort  Care  Psycho-social/Spiritual:   Desire for further Chaplaincy support:no  Additional Recommendations: Education on Hospice  Prognosis:   < 4 weeks; pt is weak, dyspneic, and has minimal oral intake. His electrolytes were significantly abnormal on admission. I suspect he has closer to 2 weeks.  Discharge Planning: Hospice facility      Primary Diagnoses: Present on Admission: . Failure to thrive (0-17)   I have reviewed the medical record, interviewed the patient and family, and examined the patient. The following aspects are pertinent.  Past Medical History:  Diagnosis Date  . Angiomyolipoma 2009   On both kidneys noted in 2009  . Arthritis    neck and left wrist  . Atrial fibrillation ([place])   . CAD (coronary artery disease)    s/b CABG 1994, and subsequent stents. Repeat CABG 12/2011,  . Chronic diastolic heart failure ([place])   . CKD (chronic kidney disease)   . Clostridium difficile diarrhea 03/2016  . Depressive disorder   . Diabetes mellitus type 2 with peripheral artery disease ([facility])    DIET CONTROLLED  . DVT (deep venous thrombosis) ([place]) 2011   Right arm  . Gastroparesis   . GERD (gastroesophageal reflux disease)   . Gout   . History of hiatal hernia   . Hyperlipidemia   . Hypertension   . Internal hemorrhoids without mention of complication   . Ischemic colitis ([place])   . 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 Myelodysplastic syndrome ([place]) 05/22/2013   With low hemoglobin and platelets treated with Procrit  . Osteopenia   . Peptic ulcer    S/p partial gastrectomy in 1969  . Peripheral artery disease ([place])   . Pneumonia 01/16/2016  . Pneumonia 03/2016  . Presence of permanent cardiac pacemaker   . Prostate cancer ([place]) 1997   XRT and lupron  . Renal artery stenosis ([place])   . Sick sinus syndrome ([place])   . Vitamin B 12 deficiency    Social History   Social History  . Marital status: Married    Spouse name: N/A  . Number of children: N/A  . Years of  education: N/A   Social History Main Topics  . Smoking status: Former Smoker    Years: 20.00    Types: Cigarettes    Quit date: 07/12/1969  . Smokeless tobacco: Never Used  . Alcohol use No  . Drug use: No  . Sexual activity: Yes   Other Topics Concern  . None   Social History Narrative  . None   Family History  Problem Relation Age of Onset  . Diabetes Mother   . Heart disease Mother     Heart Disease before age 74  . Hyperlipidemia Mother   . Hypertension Mother   . CVA Mother 48    cause of death  . Cancer Father     stomach/liver  . Hypertension Father     possibly hypertensive  . Cancer Sister     Breast cancer  . CAD Brother   . Heart disease Brother   . Cancer Paternal Uncle     colon  . Heart disease Sister   . Diabetes Sister   . Hypertension Sister   . Heart disease Daughter     Heart Disease  before age 53  . Hypertension Daughter   . Heart attack Daughter    Scheduled Meds: . amiodarone  100 mg Oral Daily  . amLODipine  5 mg Oral Daily  . atorvastatin  80 mg Oral q1800  . carvedilol  25 mg Oral BID WC  . Chlorhexidine Gluconate Cloth  6 each Topical Q0600  . feeding supplement (GLUCERNA SHAKE)  237 mL Oral BID BM  . ferrous sulfate  325 mg Oral TID WC  . furosemide  40 mg Intravenous BID  . hydrALAZINE  75 mg Oral Q6H  . isosorbide mononitrate  120 mg Oral Daily  . levothyroxine  50 mcg Oral QAC breakfast  . multivitamin with minerals  1 tablet Oral Daily  . mupirocin ointment  1 application Nasal BID  . nutrition supplement (JUVEN)  1 packet Oral BID WC  . potassium chloride  40 mEq Oral BID  . sodium chloride flush  3 mL Intravenous Q12H  . tamsulosin  0.4 mg Oral QPC supper  . Warfarin - Pharmacist Dosing Inpatient   Does not apply q1800   Continuous Infusions: . dextrose 5 % and 0.45 % NaCl with KCl 40 mEq/L 75 mL/hr at 08/12/16 2242   PRN Meds:.sodium chloride, acetaminophen, ondansetron (ZOFRAN) IV, sodium chloride flush Allergies   Allergen Reactions  . Nsaids Nausea Only    Reaction:  GI upset   . Ibuprofen Other (See Comments)    Reaction:  GI upset   . Ace Inhibitors Cough   Review of Systems  Constitutional: Positive for activity change, appetite change and fatigue.  HENT: Positive for hearing loss. Negative for sore throat and trouble swallowing.   Eyes: Negative for visual disturbance.  Respiratory: Positive for shortness of breath (chronic). Negative for wheezing.   Cardiovascular: Negative for chest pain.  Gastrointestinal: Negative for abdominal pain.  Genitourinary: Negative for difficulty urinating.  Musculoskeletal: Positive for gait problem (left leg sore).  Neurological: Positive for dizziness and weakness. Negative for speech difficulty and headaches.  Psychiatric/Behavioral: Negative for confusion and sleep disturbance. The patient is not nervous/anxious.    Physical Exam  Constitutional: Vital signs are normal. He appears lethargic. He has a sickly appearance.  Frail man lying in bed  HENT:  Head: Normocephalic and atraumatic.  Mouth/Throat: Mucous membranes are dry. No oropharyngeal exudate.  Eyes: EOM are normal.  Neck: Normal range of motion.  Pulmonary/Chest: Accessory muscle usage present.  Mild dyspnea at rest, requires repeated breaks during brief conversation to catch breath  Musculoskeletal:  Generalized weakness  Neurological: He appears lethargic.  Fully oriented and conversational  Psychiatric: He has a normal mood and affect. Judgment and thought content normal. His speech is delayed. He is slowed and withdrawn. Cognition and memory are normal.    Vital Signs: BP 138/67 (BP Location: Right Arm)   Pulse 86   Temp 99 F (37.2 C) (Oral)   Resp 18   Ht _0  (1.778 m)   Wt 84.6 kg (186 lb 8 oz)   SpO2 96%   BMI 26.76 kg/m  Pain Assessment: No/denies pain   Pain Score: 7    SpO2: SpO2: 96 % O2 Device:SpO2: 96 % O2 Flow Rate: .   IO: Intake/output summary:   Intake/Output Summary (Last 24 hours) at 08/13/16 0734 Last data filed at 08/13/16 0600  Gross per 24 hour  Intake          4358.75 ml  Output  301 ml  Net          4057.75 ml    LBM: Last BM Date: 08/12/16 Baseline Weight: Weight: 90.7 kg (200 lb) Most recent weight: Weight: 84.6 kg (186 lb 8 oz)     Palliative Assessment/Data: PPS 30-40%    Time In: 1000 Time Out: 1130 Time Total: 110 minutes Greater than 50%  of this time was spent counseling and coordinating care related to the above assessment and plan.  Signed by: Charlynn Court, NP Palliative Medicine Team Pager # [phone] (M-F 7a-5p) Team Phone # [phone] (Nights/Weekends)

## 2016-08-13 NOTE — Progress Notes (Signed)
Nurse Leader rounding conducted with patient and family.

## 2016-08-13 NOTE — Progress Notes (Signed)
Patient has a temperature of 102. Tylenol prn given. Ria Comment, NP made aware. Will continue to monitor.

## 2016-08-14 ENCOUNTER — Inpatient Hospital Stay (HOSPITAL_COMMUNITY): Payer: Medicare Other

## 2016-08-14 DIAGNOSIS — D638 Anemia in other chronic diseases classified elsewhere: Secondary | ICD-10-CM

## 2016-08-14 DIAGNOSIS — J189 Pneumonia, unspecified organism: Secondary | ICD-10-CM

## 2016-08-14 DIAGNOSIS — Z7901 Long term (current) use of anticoagulants: Secondary | ICD-10-CM

## 2016-08-14 DIAGNOSIS — A0472 Enterocolitis due to Clostridium difficile, not specified as recurrent: Secondary | ICD-10-CM

## 2016-08-14 DIAGNOSIS — N179 Acute kidney failure, unspecified: Secondary | ICD-10-CM

## 2016-08-14 LAB — GLUCOSE, CAPILLARY
GLUCOSE-CAPILLARY: 239 mg/dL — AB (ref 65–99)
GLUCOSE-CAPILLARY: 168 mg/dL — AB (ref 65–99)

## 2016-08-14 LAB — COMPREHENSIVE METABOLIC PANEL
ALBUMIN: 1.8 g/dL — AB (ref 3.5–5.0)
ALK PHOS: 65 U/L (ref 38–126)
ALT: 25 U/L (ref 17–63)
AST: 73 U/L — ABNORMAL HIGH (ref 15–41)
Anion gap: 10 (ref 5–15)
BILIRUBIN TOTAL: 0.7 mg/dL (ref 0.3–1.2)
BUN: 102 mg/dL — AB (ref 6–20)
CALCIUM: 8.2 mg/dL — AB (ref 8.9–10.3)
CO2: 20 mmol/L — AB (ref 22–32)
Chloride: 104 mmol/L (ref 101–111)
Creatinine, Ser: 3.01 mg/dL — ABNORMAL HIGH (ref 0.61–1.24)
GFR calc Af Amer: 21 mL/min — ABNORMAL LOW (ref >60)
GFR calc non Af Amer: 18 mL/min — ABNORMAL LOW (ref >60)
GLUCOSE: 234 mg/dL — AB (ref 65–99)
Potassium: 5 mmol/L (ref 3.5–5.1)
Sodium: 134 mmol/L — ABNORMAL LOW (ref 135–145)
TOTAL PROTEIN: 7.1 g/dL (ref 6.5–8.1)

## 2016-08-14 LAB — MAGNESIUM: MAGNESIUM: 1.3 mg/dL — AB (ref 1.7–2.4)

## 2016-08-14 LAB — T4, FREE: Free T4: 0.7 ng/dL (ref 0.61–1.12)

## 2016-08-14 LAB — CBC
HEMATOCRIT: 22.7 % — AB (ref 39.0–52.0)
HEMOGLOBIN: 7.4 g/dL — AB (ref 13.0–17.0)
MCH: 27.2 pg (ref 26.0–34.0)
MCHC: 32.6 g/dL (ref 30.0–36.0)
MCV: 83.5 fL (ref 78.0–100.0)
Platelets: 272 10*3/uL (ref 150–400)
RBC: 2.72 MIL/uL — AB (ref 4.22–5.81)
RDW: 19.1 % — ABNORMAL HIGH (ref 11.5–15.5)
WBC: 20.6 10*3/uL — AB (ref 4.0–10.5)

## 2016-08-14 LAB — RESPIRATORY PANEL BY PCR
ADENOVIRUS-RVPPCR: NOT DETECTED
Bordetella pertussis: NOT DETECTED
CHLAMYDOPHILA PNEUMONIAE-RVPPCR: NOT DETECTED
CORONAVIRUS 229E-RVPPCR: NOT DETECTED
CORONAVIRUS HKU1-RVPPCR: NOT DETECTED
CORONAVIRUS NL63-RVPPCR: NOT DETECTED
Coronavirus OC43: NOT DETECTED
Influenza A: NOT DETECTED
Influenza B: NOT DETECTED
Metapneumovirus: NOT DETECTED
Mycoplasma pneumoniae: NOT DETECTED
PARAINFLUENZA VIRUS 2-RVPPCR: NOT DETECTED
PARAINFLUENZA VIRUS 3-RVPPCR: NOT DETECTED
Parainfluenza Virus 1: NOT DETECTED
Parainfluenza Virus 4: NOT DETECTED
RHINOVIRUS / ENTEROVIRUS - RVPPCR: NOT DETECTED
Respiratory Syncytial Virus: NOT DETECTED

## 2016-08-14 LAB — PROTIME-INR
INR: 2.97
Prothrombin Time: 31.5 seconds — ABNORMAL HIGH (ref 11.4–15.2)

## 2016-08-14 LAB — PHOSPHORUS: PHOSPHORUS: 2.6 mg/dL (ref 2.5–4.6)

## 2016-08-14 LAB — BRAIN NATRIURETIC PEPTIDE: B Natriuretic Peptide: 665.3 pg/mL — ABNORMAL HIGH (ref 0.0–100.0)

## 2016-08-14 LAB — LACTIC ACID, PLASMA: LACTIC ACID, VENOUS: 1.8 mmol/L (ref 0.5–1.9)

## 2016-08-14 MED ORDER — INSULIN ASPART 100 UNIT/ML ~~LOC~~ SOLN
0.0000 [IU] | Freq: Three times a day (TID) | SUBCUTANEOUS | Status: DC
  Administered 2016-08-14: 5 [IU] via SUBCUTANEOUS
  Administered 2016-08-15 – 2016-08-16 (×3): 3 [IU] via SUBCUTANEOUS
  Administered 2016-08-16 (×2): 2 [IU] via SUBCUTANEOUS
  Administered 2016-08-17: 3 [IU] via SUBCUTANEOUS
  Administered 2016-08-18 – 2016-08-19 (×4): 2 [IU] via SUBCUTANEOUS

## 2016-08-14 MED ORDER — SODIUM CHLORIDE 0.9 % IV SOLN
INTRAVENOUS | Status: DC
  Administered 2016-08-14: 15:00:00 via INTRAVENOUS

## 2016-08-14 MED ORDER — POLYETHYLENE GLYCOL 3350 17 G PO PACK: 17.0000 g | PACK | Freq: Every day | ORAL | Status: DC

## 2016-08-14 MED ORDER — PIPERACILLIN-TAZOBACTAM IN DEX 2-0.25 GM/50ML IV SOLN
2.2500 g | Freq: Four times a day (QID) | INTRAVENOUS | Status: DC
  Administered 2016-08-14 – 2016-08-15 (×4): 2.25 g via INTRAVENOUS
  Filled 2016-08-14 (×5): qty 50

## 2016-08-14 MED ORDER — METOPROLOL TARTRATE 5 MG/5ML IV SOLN: 5.0000 mg | Freq: Two times a day (BID) | INTRAVENOUS | Status: DC | PRN

## 2016-08-14 MED ORDER — IOPAMIDOL (ISOVUE-300) INJECTION 61%
INTRAVENOUS | Status: AC
  Filled 2016-08-14: qty 30

## 2016-08-14 MED ORDER — SENNA 8.6 MG PO TABS
1.0000 | ORAL_TABLET | Freq: Two times a day (BID) | ORAL | Status: DC
  Administered 2016-08-14 – 2016-08-16 (×2): 8.6 mg via ORAL
  Filled 2016-08-14 (×3): qty 1

## 2016-08-14 MED ORDER — IPRATROPIUM-ALBUTEROL 0.5-2.5 (3) MG/3ML IN SOLN
3.0000 mL | Freq: Four times a day (QID) | RESPIRATORY_TRACT | Status: DC
  Administered 2016-08-14 – 2016-08-15 (×5): 3 mL via RESPIRATORY_TRACT
  Filled 2016-08-14 (×5): qty 3

## 2016-08-14 MED ORDER — METRONIDAZOLE IN NACL 5-0.79 MG/ML-% IV SOLN
500.0000 mg | Freq: Three times a day (TID) | INTRAVENOUS | Status: DC
  Administered 2016-08-14 – 2016-08-15 (×3): 500 mg via INTRAVENOUS
  Filled 2016-08-14 (×3): qty 100

## 2016-08-14 MED ORDER — PIPERACILLIN-TAZOBACTAM 3.375 G IVPB
3.3750 g | Freq: Three times a day (TID) | INTRAVENOUS | Status: DC
  Filled 2016-08-14: qty 50

## 2016-08-14 MED ORDER — WARFARIN SODIUM 5 MG PO TABS
5.0000 mg | ORAL_TABLET | Freq: Once | ORAL | Status: AC
  Administered 2016-08-14: 5 mg via ORAL
  Filled 2016-08-14: qty 1

## 2016-08-14 MED ORDER — ALBUTEROL SULFATE (2.5 MG/3ML) 0.083% IN NEBU: 2.5000 mg | INHALATION_SOLUTION | RESPIRATORY_TRACT | Status: DC | PRN

## 2016-08-14 NOTE — Progress Notes (Addendum)
   Acute on chronic diastolic HF with marked volume overload has resolved.  I believe he is volume contracted(intravascular). Would hold diuresis and gently hydrate.  BNP < 400.  Acute on chronic  kidney disease stage 4, with  BUN 96 Creat 3.26 is pre-renal compared to baseline 30 and 2.5 respectively.

## 2016-08-14 NOTE — H&P (Deleted)
[facility] Consult Note History and Physical  [name] [id number] DOB: 1936-04-14 DOA: 08/11/2016  Requesting physician: Haywood Lasso, NP called in for  Consult for: medical management of issues that are not cardiac related, requested patient be brought on to medical service  PCP: [name], MD   Chief Complaint: "Tired."  HPI: [name] Brow. is a 81 y.o. male  significant for atrial fibrillation, coronary artery disease, CK D, diabetes, gastroparesis, GERD, gout, hypertension. Patient was originally admitted by cardiology on the 31st for weakness and fatigue thought to be due to congestive heart failure. Patient had a fever overnight 102. Patient is still very tired and weak per nursing. No other acute events overnight.   Review of Systems:  Limited ROS due to lethargy.    Past Medical History:  Diagnosis Date  . Angiomyolipoma 2009   On both kidneys noted in 2009  . Arthritis    neck and left wrist  . Atrial fibrillation ([place])   . CAD (coronary artery disease)    s/b CABG 1994, and subsequent stents. Repeat CABG 12/2011,  . Chronic diastolic heart failure ([place])   . CKD (chronic kidney disease)   . Clostridium difficile diarrhea 03/2016  . Depressive disorder   . Diabetes mellitus type 2 with peripheral artery disease (HCC)    DIET CONTROLLED  . DVT (deep venous thrombosis) ([place]) 2011   Right arm  . Gastroparesis   . GERD (gastroesophageal reflux disease)   . Gout   . History of hiatal hernia   . Hyperlipidemia   . Hypertension   . Internal hemorrhoids without mention of complication   . Ischemic colitis ([place])   . 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 Myelodysplastic syndrome ([place]) 05/22/2013   With low hemoglobin and platelets treated with Procrit  . Osteopenia   . Peptic ulcer    S/p partial gastrectomy in 1969  . Peripheral artery disease ([place])   . Pneumonia 01/16/2016  . Pneumonia 03/2016  . Presence of permanent cardiac  pacemaker   . Prostate cancer ([place]) 1997   XRT and lupron  . Renal artery stenosis ([place])   . Sick sinus syndrome ([place])   . Vitamin B 12 deficiency    Past Surgical History:  Procedure Laterality Date  . ABDOMINAL ANGIOGRAM N/A 05/25/2011   Procedure: ABDOMINAL ANGIOGRAM;  Surgeon: Serafina Mitchell, MD;  Location: [facility] CATH LAB;  Service: Cardiovascular;  Laterality: N/A;  . ABDOMINAL ANGIOGRAM N/A 02/13/2013   Procedure: ABDOMINAL ANGIOGRAM;  Surgeon: Serafina Mitchell, MD;  Location: [facility] CATH LAB;  Service: Cardiovascular;  Laterality: N/A;  . APPENDECTOMY  1991  . CELIAC ARTERY ANGIOPLASTY  05-16-12   and stenting  . CHOLECYSTECTOMY  Oct 2009   Laparoscopic  . CORONARY ARTERY BYPASS GRAFT  01/22/1993  . CORONARY ARTERY BYPASS GRAFT  01/03/2012   Procedure: REDO CORONARY ARTERY BYPASS GRAFTING (CABG);  Surgeon: Gaye Pollack, MD;  Location: [place];  Service: Open Heart Surgery;  Laterality: N/A;  Redo CABG x  using bilateral internal mammary arteries;  left leg greater saphenous vein harvested endoscopically  . FOREIGN BODY REMOVAL ABDOMINAL Right 01/27/2016   Procedure: EXPOSURE OF RIGHT COMMON FEMORAL ARTERY AND REMOVAL FOREIGN;  Surgeon: Evans Lance, MD;  Location: [place];  Service: Cardiovascular;  Laterality: Right;  . HIATAL HERNIA REPAIR     and ulcer repair  . I&D EXTREMITY Left 01/21/2016   Procedure: IRRIGATION AND DEBRIDEMENT EXTREMITY;  Surgeon: Milly Jakob, MD;  Location: [place];  Service: Orthopedics;  Laterality: Left;  . INGUINAL HERNIA REPAIR Right 10/28/2015   Procedure: OPEN RIGHT INGUINAL HERNIA REPAIR;  Surgeon: Greer Pickerel, MD;  Location: [facility];  Service: General;  Laterality: Right;  . INSERTION OF MESH Right 10/28/2015   Procedure: INSERTION OF MESH;  Surgeon: Greer Pickerel, MD;  Location: [facility];  Service: General;  Laterality: Right;  . IR GENERIC HISTORICAL  02/02/2016   IR FLUORO GUIDE CV LINE LEFT 02/02/2016 Markus Daft, MD [facility]  . IR GENERIC HISTORICAL   02/02/2016   IR US GUIDE VASC ACCESS LEFT 02/02/2016 Markus Daft, MD [facility]  . IR GENERIC HISTORICAL  03/31/2016   IR REMOVAL TUN CV CATH W/O FL 03/31/2016 Arne Cleveland, MD [facility]  . LEFT HEART CATHETERIZATION WITH CORONARY/GRAFT ANGIOGRAM  12/24/2011   Procedure: LEFT HEART CATHETERIZATION WITH Beatrix Fetters;  Surgeon: Sinclair Grooms, MD;  Location: [facility] CATH LAB;  Service: Cardiovascular;;  . LOWER EXTREMITY ANGIOGRAM Bilateral 05/25/2011   Procedure: LOWER EXTREMITY ANGIOGRAM;  Surgeon: Serafina Mitchell, MD;  Location: [facility] CATH LAB;  Service: Cardiovascular;  Laterality: Bilateral;  . OTHER SURGICAL HISTORY  02/13/13   superior mesenteric artery angiogram  . OTHER SURGICAL HISTORY  05/16/12   Stent in stomach  . PACEMAKER GENERATOR CHANGE  12/10/2003   SJM Identity XL DR performed by Dr Leonia Reeves  . PACEMAKER INSERTION  10/18/1994   DDD pacemaker, [employer]. Gen change 12/10/2003.  Marland Kitchen PACEMAKER LEAD REMOVAL Right 01/27/2016   Procedure: PACEMAKER EXTRACTION;  Surgeon: Evans Lance, MD;  Location: [place];  Service: Cardiovascular;  Laterality: Right;  DR. Roxy Manns TO BACKUP CASE  . PARTIAL GASTRECTOMY  1969   Hx of ulcer s/p partial gastrectomy/ has pernicious anemia  . RENAL ANGIOGRAM N/A 02/13/2013   Procedure: RENAL ANGIOGRAM;  Surgeon: Serafina Mitchell, MD;  Location: [facility] CATH LAB;  Service: Cardiovascular;  Laterality: N/A;  . TEE WITHOUT CARDIOVERSION N/A 01/26/2016   Procedure: TRANSESOPHAGEAL ECHOCARDIOGRAM (TEE);  Surgeon: Sueanne Margarita, MD;  Location: [facility] ENDOSCOPY;  Service: Cardiovascular;  Laterality: N/A;  . TEE WITHOUT CARDIOVERSION N/A 01/27/2016   Procedure: TRANSESOPHAGEAL ECHOCARDIOGRAM (TEE);  Surgeon: Evans Lance, MD;  Location: [place];  Service: Cardiovascular;  Laterality: N/A;  . TEE WITHOUT CARDIOVERSION N/A 04/12/2016   Procedure: TRANSESOPHAGEAL ECHOCARDIOGRAM (TEE);  Surgeon: Dorothy Spark, MD;  Location: [place];  Service: Cardiovascular;  Laterality:  N/A;  . THROMBECTOMY / EMBOLECTOMY SUBCLAVIAN ARTERY  02/02/10   Right subclavian thromboectomy and venous angioplasty, and chronic mesenteric ischemia with Herculink stenting to superior mesenteric and celiac arteries - Dr. Trula Slade  . TRANSURETHRAL RESECTION OF PROSTATE N/A 02/22/2016   Procedure: CYSTO, CLOT EVACUATION, FULGERATION;  Surgeon: Alexis Frock, MD;  [facility];  Service: Urology;  Laterality: N/A;  . VISCERAL ANGIOGRAM N/A 05/25/2011   Procedure: VISCERAL ANGIOGRAM;  Surgeon: Serafina Mitchell, MD;  Location: [facility] CATH LAB;  Service: Cardiovascular;  Laterality: N/A;  . VISCERAL ANGIOGRAM Bilateral 12/28/2011   Procedure: VISCERAL ANGIOGRAM;  Surgeon: Serafina Mitchell, MD;  Location: [facility] CATH LAB;  Service: Cardiovascular;  Laterality: Bilateral;  . VISCERAL ANGIOGRAM N/A 05/16/2012   Procedure: VISCERAL ANGIOGRAM;  Surgeon: Serafina Mitchell, MD;  Location: [facility] CATH LAB;  Service: Cardiovascular;  Laterality: N/A;  . VISCERAL ANGIOGRAM N/A 02/13/2013   Procedure: VISCERAL ANGIOGRAM;  Surgeon: Serafina Mitchell, MD;  Location: [facility] CATH LAB;  Service: Cardiovascular;  Laterality: N/A;   Social History:  reports that he quit smoking about 47 years ago. His smoking use included Cigarettes. He quit after 20.00 years of use. He has never used smokeless tobacco. He reports that he does not drink alcohol or use drugs.  Allergies  Allergen Reactions  . Nsaids Nausea Only    Reaction:  GI upset   . Ibuprofen Other (See Comments)    Reaction:  GI upset   . Ace Inhibitors Cough    Family History  Problem Relation Age of Onset  . Diabetes Mother   . Heart disease Mother     Heart Disease before age 63  . Hyperlipidemia Mother   . Hypertension Mother   . CVA Mother 79    cause of death  . Cancer Father     stomach/liver  . Hypertension Father     possibly hypertensive  . Cancer Sister     Breast cancer  . CAD Brother   . Heart disease Brother   . Cancer Paternal Uncle     colon  .  Heart disease Sister   . Diabetes Sister   . Hypertension Sister   . Heart disease Daughter     Heart Disease before age 56  . Hypertension Daughter   . Heart attack Daughter      Prior to Admission medications   Medication Sig Start Date End Date Taking? Authorizing Provider  acetaminophen (TYLENOL) 650 MG CR tablet Take 650 mg by mouth 2 (two) times daily.    Yes Historical Provider, MD  Amino Acids-Protein Hydrolys (FEEDING SUPPLEMENT, PRO-STAT SUGAR FREE 64,) LIQD Take 30 mLs by mouth 2 (two) times daily with a meal.    Yes Historical Provider, MD  amiodarone (PACERONE) 100 MG tablet Take 100 mg by mouth daily.    Yes Historical Provider, MD  amLODipine (NORVASC) 5 MG tablet Take 1 tablet (5 mg total) by mouth daily. 07/01/16  Yes Velvet Bathe, MD  atorvastatin (LIPITOR) 80 MG tablet Take 80 mg by mouth at bedtime.    Yes Historical Provider, MD  calcium carbonate (OSCAL) 1500 (600 Ca) MG TABS tablet Take 600 mg of elemental calcium by mouth daily with breakfast.   Yes Historical Provider, MD  carvedilol (COREG) 25 MG tablet Take 1 tablet (25 mg total) by mouth 2 (two) times daily with a meal. 04/16/16  Yes Nishant Dhungel, MD  ciprofloxacin (CIPRO) 500 MG tablet Take 500 mg by mouth 2 (two) times daily.   Yes Historical Provider, MD  cyanocobalamin (,VITAMIN B-12,) 1000 MCG/ML injection Inject 1,000 mcg into the muscle every 30 (thirty) days.    Yes Historical Provider, MD  epoetin alfa (EPOGEN,PROCRIT) 91478 UNIT/ML injection Inject 10,000 Units into the skin every 14 (fourteen) days.    Yes Historical Provider, MD  feeding supplement, GLUCERNA SHAKE, (GLUCERNA SHAKE) LIQD Take 237 mLs by mouth 2 (two) times daily between meals. To support nutritional status   Yes Historical Provider, MD  ferrous sulfate 325 (65 FE) MG tablet Take 1 tablet (325 mg total) by mouth 3 (three) times daily with meals. 02/24/16  Yes Venetia Maxon Rama, MD  furosemide (LASIX) 80 MG tablet Take 1 tablet (80 mg  total) by mouth 2 (two) times daily. Patient taking differently: Take 160 mg by mouth 2 (two) times daily.  07/19/16 10/17/16 Yes Belva Crome, MD  hydrALAZINE (APRESOLINE) 25 MG tablet Take 3 tablets (75 mg total) by mouth 4 (four) times daily. 02/24/16  Yes Venetia Maxon Rama, MD  isosorbide mononitrate (IMDUR) 120  MG 24 hr tablet Take 120 mg by mouth daily.    Yes Historical Provider, MD  levothyroxine (SYNTHROID) 50 MCG tablet Take 1 tablet (50 mcg total) by mouth daily before breakfast. 06/22/16  Yes Belva Crome, MD  Multiple Vitamin (MULTIVITAMIN WITH MINERALS) TABS tablet Take 1 tablet by mouth daily.    Yes Historical Provider, MD  Nutritional Supplements (FEEDING SUPPLEMENT, GLUCERNA 1.2 CAL,) LIQD Take 237 mLs by mouth 2 (two) times daily as needed (for nutritional supplement).    Yes Historical Provider, MD  nystatin (NYSTATIN) powder Apply 1 g topically 2 (two) times daily. To groin area for candida   Yes Historical Provider, MD  potassium chloride SA (K-DUR,KLOR-CON) 20 MEQ tablet Take 20 mEq by mouth 2 (two) times daily.   Yes Historical Provider, MD  Saccharomyces boulardii (PROBIOTIC) 250 MG CAPS Take 1 capsule by mouth 2 (two) times daily.   Yes Historical Provider, MD  silodosin (RAPAFLO) 8 MG CAPS capsule Take 8 mg by mouth at bedtime.    Yes Historical Provider, MD  warfarin (COUMADIN) 5 MG tablet Take one (1) tablet (5 mg total) by mouth daily Monday to Saturday.   Yes Historical Provider, MD  warfarin (COUMADIN) 6 MG tablet Take one (1) tablets (6 mg total) by mouth daily on Sundays.   Yes Historical Provider, MD  leuprolide (LUPRON) 30 MG injection Inject 30 mg into the muscle every 4 (four) months.     Historical Provider, MD   Physical Exam: Vitals:   08/13/16 1836 08/13/16 2118 08/14/16 0425 08/14/16 0934  BP:  (!) 154/66 (!) 151/65 (!) 170/69  Pulse:  100 (!) 105 (!) 105  Resp:  18 18 (!) 22  Temp: (!) 101.1 F (38.4 C) (!) 101.1 F (38.4 C) (!) 101.1 F (38.4 C) 99.9  F (37.7 C)  TempSrc: Oral Oral Oral Oral  SpO2:  97% 97% 100%  Weight:   83.6 kg (184 lb 4.8 oz)   Height:        Wt Readings from Last 3 Encounters:  08/14/16 83.6 kg (184 lb 4.8 oz)  07/30/16 90.8 kg (200 lb 3.2 oz)  07/23/16 78.9 kg (174 lb)    General:  Appears calm and comfortable, sleepy Eyes:  PERRL, EOMI, normal lids, iris ENT:  grossly normal hearing, lips & tongue Neck:  no LAD, masses or thyromegaly Cardiovascular:  RRR, no m/r/g. Bil 2+ edema.  Respiratory:  Decr air movement, no w/r/ but rales present in lun bases. Mild incr wob. Tachypnea. Abdomen:  soft, nd, RUQ/RLQ tenderness Skin:  no rash or induration seen on limited exam Musculoskeletal:  grossly normal tone BUE/BLE Psychiatric:  grossly normal mood and affect, speech fluent and appropriate Neurologic:  CN limited exam due to sleepiness, moves all extremities .          Labs on Admission:  Basic Metabolic Panel:  Recent Labs Lab 08/11/16 1343 08/11/16 1850 08/12/16 0704 08/13/16 0448  NA 136 136 136 135  K 2.8* 2.5* 2.5* 3.5  CL 100* 100* 101 101  CO2 23 23 23  21*  GLUCOSE 196* 171* 200* 200*  BUN 91* 90* 91* 96*  CREATININE 3.38* 3.27* 3.18* 3.26*  CALCIUM 8.4* 8.0* 7.9* 8.0*  MG  --  1.3*  --   --    Liver Function Tests:  Recent Labs Lab 08/11/16 1343 08/11/16 1850  AST 46* 41  ALT 17 15*  ALKPHOS 56 55  BILITOT 0.3 0.3  PROT 7.3 7.0  ALBUMIN  1.8* 1.8*    Recent Labs Lab 08/11/16 1343  LIPASE 71*    Recent Labs Lab 08/11/16 1343  AMMONIA 13   CBC:  Recent Labs Lab 08/11/16 1343 08/11/16 1850  WBC 12.8* 14.2*  NEUTROABS 10.5* 11.7*  HGB 8.3* 8.1*  HCT 25.6* 25.1*  MCV 86.8 85.7  PLT 241 261   Cardiac Enzymes:  Recent Labs Lab 08/11/16 1850 08/12/16 0120 08/12/16 0704  TROPONINI 0.07* 0.06* 0.06*    BNP (last 3 results)  Recent Labs  06/25/16 0340 08/11/16 1343 08/11/16 1850  BNP 1,430.8* 355.1* 319.0*    ProBNP (last 3 results) No results  for input(s): PROBNP in the last 8760 hours.   Serum creatinine: 3.26 mg/dL High 08/13/16 0448 Estimated creatinine clearance: 19.3 mL/min  CBG: No results for input(s): GLUCAP in the last 168 hours.  Radiological Exams on Admission: Dg Chest Port 1 View  Result Date: 08/14/2016 CLINICAL DATA:  81 year old male with acute on chronic heart failure. Wheezing and lethargy. Initial encounter. EXAM: PORTABLE CHEST 1 VIEW COMPARISON:  08/12/2016 and earlier. FINDINGS: Portable AP semi upright view at 0924 hours. Mildly improved lung volumes and bibasilar ventilation. Stable cardiomegaly and mediastinal contours. Calcified aortic atherosclerosis. No pneumothorax or pulmonary edema. No definite pleural effusion or consolidation. Residual patchy bibasilar opacity most resembles atelectasis. Negative visible bowel gas pattern. IMPRESSION: Improved lung volumes with regressed bibasilar atelectasis. Stable cardiomegaly and Calcified aortic atherosclerosis. No pulmonary edema. Electronically Signed   By: Genevie Ann M.D.   On: 08/14/2016 09:36     I independently reviewed the chest x-ray and agree with the impression of the radiologist. Findings: Atelectasis.  EKG: Independently reviewed. 2/1, poor tracing, no STEMI  Assessment/Plan Active Problems:   Diabetes mellitus (HCC)   CKD (chronic kidney disease), stage IV (HCC)   Anemia of chronic disease   CAD of autologous bypass graft   Acute renal failure superimposed on stage 4 chronic kidney disease (HCC)   HCAP (healthcare-associated pneumonia)   C. difficile colitis   Acute on chronic diastolic CHF (congestive heart failure) (HCC)   Warfarin anticoagulation   Failure to thrive (0-17)   Malnutrition of moderate degree   Palliative care by specialist   Fever Blood cultures pending Getting  resp viral panel LA pending   UTI  Dirty UA 1/31  Starting zosyn  Asking nursing to get sample today for culture, checking PVR since pt has hx of  retention, may need more flomax   Cdiff pos  On oral vanc given history of recurrent C. difficile will escalate treatment by adding IV Flagyl  Flagyl can be switched to PO tomorrow if pt doing better  CT abdomen w/o cont ordered   Abn CXR  Re-image in AM, do not feel this is pna but this could develop into one, will increase pulm toilet  Dyspnea IS ordered Likely related to cardiac issues No acute infectious process on CXR today Wheeze on exam noted Sch duonebs Prn albuterol RT consult  FTT Nl ammonia level on admit  Hypothyroidism Chart review shows that month ago patient's TSH was 22 per report his Synthroid was increased but now his TSH is higher at 32 Awaiting free T4 Will likely need to increase patient's Synthroid  Electrolyte abnormalities Will monitor replace as necessary IV and by mouth Checking magnesium and phosphorus today  Acute chronic diastolic heart failure Likely some element of cardiorenal Management per cardiology  PAF Management per cardiology  Acute on chronic renal failure Management per cardiology,  they feel it is cardiorenal Hypertension When necessary lopressor 5 mg IV as needed for severe blood pressure  DVT Cont coumadin  Hyperlipidemia Hold statin  Family - Called pt's wife and left VM  Thank you for allowing [place] to share in the care of this patient.  Elwin Mocha, MD Family Medicine [facility] [email or web address] Password [email or web address]

## 2016-08-14 NOTE — Progress Notes (Signed)
Progress Note  Patient Name: [name] RIDEAUX Sr. Date of Encounter: 08/14/2016  Primary Cardiologist: Linard Millers, MD   Subjective   Developed a fever last night.  Has been mildly dyspneic this am.  Very fatigued.  No chest pain.  Inpatient Medications    Scheduled Meds: . amiodarone  100 mg Oral Daily  . amLODipine  5 mg Oral Daily  . carvedilol  25 mg Oral BID WC  . Chlorhexidine Gluconate Cloth  6 each Topical Q0600  . feeding supplement (ENSURE ENLIVE)  237 mL Oral BID BM  . feeding supplement (GLUCERNA SHAKE)  237 mL Oral BID BM  . hydrALAZINE  75 mg Oral Q6H  . ipratropium-albuterol  3 mL Nebulization Q6H  . isosorbide mononitrate  120 mg Oral Daily  . levothyroxine  50 mcg Oral QAC breakfast  . mupirocin ointment  1 application Nasal BID  . nutrition supplement (JUVEN)  1 packet Oral BID WC  . polyethylene glycol  17 g Oral Daily  . senna  1 tablet Oral BID  . sodium chloride flush  3 mL Intravenous Q12H  . tamsulosin  0.4 mg Oral QPC supper  . vancomycin  125 mg Oral QID  . Warfarin - Pharmacist Dosing Inpatient   Does not apply q1800   Continuous Infusions: . dextrose 5 % and 0.45 % NaCl with KCl 40 mEq/L 75 mL/hr at 08/14/16 0301   PRN Meds: sodium chloride, acetaminophen, HYDROmorphone (DILAUDID) injection, ondansetron (ZOFRAN) IV, sodium chloride flush   Vital Signs    Vitals:   08/13/16 1836 08/13/16 2118 08/14/16 0425 08/14/16 0934  BP:  (!) 154/66 (!) 151/65 (!) 170/69  Pulse:  100 (!) 105 (!) 105  Resp:  18 18 (!) 22  Temp: (!) 101.1 F (38.4 C) (!) 101.1 F (38.4 C) (!) 101.1 F (38.4 C) 99.9 F (37.7 C)  TempSrc: Oral Oral Oral Oral  SpO2:  97% 97% 100%  Weight:   184 lb 4.8 oz (83.6 kg)   Height:        Intake/Output Summary (Last 24 hours) at 08/14/16 0941 Last data filed at 08/13/16 2230  Gross per 24 hour  Intake              120 ml  Output               50 ml  Net               70 ml   Filed Weights   08/12/16 0548 08/13/16 0504  08/14/16 0425  Weight: 190 lb 1.6 oz (86.2 kg) 186 lb 8 oz (84.6 kg) 184 lb 4.8 oz (83.6 kg)    Physical Exam   GEN: Well nourished, well developed, doesn't feel well.  Lethargic. HEENT: Grossly normal.  Neck: Supple, JVP ~ 12 cm, no carotid bruits, or masses. Cardiac: RRR, no murmurs, rubs, or gallops. No clubbing, cyanosis, 2+ bilat lower leg edema.  Radials/DP/PT 2+ and equal bilaterally.  Respiratory:  Respirations regular and unlabored, diminished breath sounds in bilat bases. GI: semi-firm, protuberant, BS + x 4. MS: no deformity or atrophy. Skin: warm and dry, no rash. Psych: AAOx3.  Flat affect.  Labs    Chemistry Recent Labs Lab 08/11/16 1343 08/11/16 1850 08/12/16 0704 08/13/16 0448  NA 136 136 136 135  K 2.8* 2.5* 2.5* 3.5  CL 100* 100* 101 101  CO2 23 23 23  21*  GLUCOSE 196* 171* 200* 200*  BUN 91* 90* 91* 96*  CREATININE 3.38* 3.27* 3.18* 3.26*  CALCIUM 8.4* 8.0* 7.9* 8.0*  PROT 7.3 7.0  --   --   ALBUMIN 1.8* 1.8*  --   --   AST 46* 41  --   --   ALT 17 15*  --   --   ALKPHOS 56 55  --   --   BILITOT 0.3 0.3  --   --   GFRNONAA 16* 17* 17* 17*  GFRAA 19* 19* 20* 19*  ANIONGAP 13 13 12 13      Hematology Recent Labs Lab 08/11/16 1343 08/11/16 1850  WBC 12.8* 14.2*  RBC 2.95* 2.93*  HGB 8.3* 8.1*  HCT 25.6* 25.1*  MCV 86.8 85.7  MCH 28.1 27.6  MCHC 32.4 32.3  RDW 19.7* 19.5*  PLT 241 261    Cardiac Enzymes Recent Labs Lab 08/11/16 1850 08/12/16 0120 08/12/16 0704  TROPONINI 0.07* 0.06* 0.06*    Recent Labs Lab 08/11/16 1354  TROPIPOC 0.06     BNP Recent Labs Lab 08/11/16 1343 08/11/16 1850  BNP 355.1* 319.0*    Lab Results  Component Value Date   INR 2.92 08/13/2016   INR 2.91 08/12/2016   INR 2.15 08/11/2016    Radiology    Dg Chest Port 1 View  Result Date: 08/14/2016 CLINICAL DATA:  81 year old male with acute on chronic heart failure. Wheezing and lethargy. Initial encounter. EXAM: PORTABLE CHEST 1 VIEW  COMPARISON:  08/12/2016 and earlier. FINDINGS: Portable AP semi upright view at 0924 hours. Mildly improved lung volumes and bibasilar ventilation. Stable cardiomegaly and mediastinal contours. Calcified aortic atherosclerosis. No pneumothorax or pulmonary edema. No definite pleural effusion or consolidation. Residual patchy bibasilar opacity most resembles atelectasis. Negative visible bowel gas pattern. IMPRESSION: Improved lung volumes with regressed bibasilar atelectasis. Stable cardiomegaly and Calcified aortic atherosclerosis. No pulmonary edema. Electronically Signed   By: Genevie Ann M.D.   On: 08/14/2016 09:36    Telemetry    RSR - Personally Reviewed  Patient Profile     81 y.o. male w/ a h/o CAD s/p CABG and redo CABG, HTN, HL, CKD IV, hypothyroidism, chronic diastolic CHF, PAF on amio/coumadin, SSS s/p prior PPM with subsequent removal 2/2 TV endocarditis s/p abx (fall 2017), PICC related upper ext DVT (fall 2017), recurrent C diff colitis on po vanc, and failure to thrive, who was admitted from the office on 1/31 with progressive generalized worsening.  Assessment & Plan    1.  Acute respiratory failure/Febrile Illness - ? PNA:  Pt was admitted 1/31 from the office with progressive failure to thrive and anasarca.  Admission cxr w/ concern for pna and vascular congestion.  He was initially diuresed but subsequently placed on IVF and is a net + 4.9L today.  Wt id down 6 lbs since admission.  He developed fever 2/2.  Currently being treated w/ PO vanc in setting of h/o C diff.  Check blood cultures.  Will ask pharmacy to assist in abx coverage.  I spoke with medicine and they will see today. Labs pending this am.  Hemodynamically stable.  Pt and family are interested in treatment as deemed necessary.  2.  Chronic diastolic CHF:  Net + with reduction in wt.  ? Accuracy.  JVD on exam in setting of known TR, with persistent lower ext swelling.  Albumin only 1.8.  Previously received IV lasix  followed by IVF.  Will hold IVF for now.  No further diuresis in setting of elevated creat.  Suspect third spacing  2/2 FTT/hypoalbuminemia.  Discussed with Dr. Tamala Julian this AM.  BPs trending 150's.  Cont  blocker and hydral/nitrate.  Reluctant to titrate in setting of acute febrile illness.  3.  CAD:  S/p CABG and redo CABG.  No chest pain.  Trop mildly elevated w/ flat trend.  Suspect demand ischemia in the setting of diast CHF/generalized illness.  No plans for ischemic eval.  Cont  blocker.  No asa in setting of warfarin.  4.  CKD IV:  Creat 2.7 on 12/26.  Admitted w/ Creat of 3.38.  Currently 3.26.  Holding off on further diuresis.  Follow.  5.  C diff colitis:  Has been on po vancomycin.  No diarrhea.  6.  PAF:  In sinus.  Cont amio/ blocker/coumadin.  INR pending this am.  7.  R Upper Ext DVT:  On coumadin.  INR Rx yesterday.  8.  Hypokalemia:  F/u bmet today.  9.  Normocytic anemia:  Cbc pending this am.  10.  Hypothyroidism:  TSH 32.676 on admission 1/31.  This was up from 22.371 on 12/15.  It appears that synthroid was increased from 25 to 50 mcg in early December @ which time TSH was 45.78.  F/u TSH 12/15 was 22.371.  F/u FT4.  Will likely need to increase synthroid to 75 mcg.  Signed, Murray Hodgkins, NP  08/14/2016, 9:41 AM

## 2016-08-14 NOTE — Progress Notes (Addendum)
Daily Progress Note   Patient Name: [name] Sr.       Date: 08/14/2016 DOB: [birth year]  Age: 81 y.o. MRN#: [id number] Attending Physician: Belva Crome, MD Primary Care Physician: [name], MD Admit Date: 08/11/2016  Reason for Consultation/Follow-up: Disposition, Non pain symptom management and Psychosocial/spiritual support  Subjective: [name] was awake but lethargic this morning. He was febrile overnight, which I suspect is from pneumonia seen on an earlier chest x-ray (not currently getting treatment for this). He also has increased wheezing this morning. O2 saturation has been stable on room air. He is otherwise without complaints and continues to have excellent oral intake.   Length of Stay: 3  Current Medications: Scheduled Meds:  . amiodarone  100 mg Oral Daily  . amLODipine  5 mg Oral Daily  . carvedilol  25 mg Oral BID WC  . Chlorhexidine Gluconate Cloth  6 each Topical Q0600  . feeding supplement (ENSURE ENLIVE)  237 mL Oral BID BM  . feeding supplement (GLUCERNA SHAKE)  237 mL Oral BID BM  . hydrALAZINE  75 mg Oral Q6H  . ipratropium-albuterol  3 mL Nebulization Q6H  . isosorbide mononitrate  120 mg Oral Daily  . levothyroxine  50 mcg Oral QAC breakfast  . mupirocin ointment  1 application Nasal BID  . nutrition supplement (JUVEN)  1 packet Oral BID WC  . sodium chloride flush  3 mL Intravenous Q12H  . tamsulosin  0.4 mg Oral QPC supper  . vancomycin  125 mg Oral QID  . Warfarin - Pharmacist Dosing Inpatient   Does not apply q1800    Continuous Infusions: . dextrose 5 % and 0.45 % NaCl with KCl 40 mEq/L 75 mL/hr at 08/14/16 0301    PRN Meds: sodium chloride, acetaminophen, HYDROmorphone (DILAUDID) injection, ondansetron (ZOFRAN) IV, sodium chloride flush  Physical Exam    Constitutional:  Vital signs are normal. He appears lethargic. He has a sickly appearance.  Frail man lying in bed  HENT:  Head: Normocephalic and atraumatic.  Mouth/Throat: Mucous membranes are dry. No oropharyngeal exudate.  Eyes: EOM are normal.  Neck: Normal range of motion.  Pulmonary/Chest: Accessory muscle usage present.  Dyspnea at rest, requires repeated breaks during brief conversation to catch breath. Audible wheezing. Diminished breath sounds at bilateral bases, mild rhonchi at left lung base.  Musculoskeletal:  Generalized weakness  Neurological: He appears lethargic.  Fully oriented and conversational  Psychiatric: He has a normal mood and affect. Judgment and thought content normal. His speech is delayed. He is slowed and withdrawn. Cognition and memory are normal.        Vital Signs: BP (!) 151/65 (BP Location: Right Arm)   Pulse (!) 105   Temp (!) 101.1 F (38.4 C) (Oral)   Resp 18   Ht '5\' 10"'$  (1.778 m)   Wt 83.6 kg (184 lb 4.8 oz)   SpO2 97%   BMI 26.44 kg/m  SpO2: SpO2: 97 % O2 Device: O2 Device: Not Delivered O2 Flow Rate:    Intake/output summary:  Intake/Output Summary (Last 24 hours) at 08/14/16 0916 Last data filed at 08/13/16 2230  Gross per 24 hour  Intake  594 ml  Output               50 ml  Net              544 ml   LBM: Last BM Date: 08/11/16 Baseline Weight: Weight: 90.7 kg (200 lb) Most recent weight: Weight: 83.6 kg (184 lb 4.8 oz)  Palliative Assessment/Data: PPS 40%   Flowsheet Rows   Flowsheet Row Most Recent Value  Intake Tab  Referral Department  Cardiology  Unit at Time of Referral  Cardiac/Telemetry Unit  Palliative Care Primary Diagnosis  Cardiac  Date Notified  08/11/16  Palliative Care Type  Return patient Palliative Care  Reason for referral  Clarify Goals of Care  Date of Admission  08/11/16  # of days IP prior to Palliative referral  0  Clinical Assessment  Psychosocial & Spiritual Assessment  Palliative Care Outcomes        Patient Active Problem List   Diagnosis Date Noted  . Malnutrition of moderate degree 08/13/2016  . Palliative care by specialist   . Failure to thrive (0-17) 08/11/2016  . Serratia marcescens infection ([place]) 07/31/2016  . Foreskin does not retract 07/31/2016  . Ulcer of urethral meatus 07/31/2016  . Hyperlipidemia 07/06/2016  . BPH (benign prostatic hyperplasia) 07/06/2016  . Acute back pain 06/24/2016  . Acute on chronic diastolic CHF (congestive heart failure) ([place]) 06/24/2016  . Debility 06/24/2016  . Leg weakness, bilateral 06/24/2016  . Pseudomonas urinary tract infection 06/24/2016  . Fall 06/24/2016  . Warfarin anticoagulation 06/24/2016  . Encounter for therapeutic drug monitoring 06/07/2016  . Pressure injury of skin 05/27/2016  . Acute deep vein thrombosis (DVT) of right upper extremity ([place]) 05/25/2016  . History of pacemaker 05/03/2016  . Anemia, iron deficiency 04/16/2016  . Endocarditis of tricuspid valve   . C. difficile colitis 04/10/2016  . HCAP (healthcare-associated pneumonia) 04/08/2016  . Chronic kidney disease, stage III (moderate) 03/09/2016  . Pressure ulcer 02/23/2016  . Seizure ([place]) 02/20/2016  . Palliative care encounter   . Goals of care, counseling/discussion   . Pacemaker infection ([place])   . Acute renal failure superimposed on stage 4 chronic kidney disease ([place]) 01/26/2016  . Bacteremia due to Staphylococcus aureus   . Pseudogout involving multiple joints 01/24/2016  . Bladder mass 01/24/2016  . Hemoptysis   . Pain   . Left upper extremity swelling   . Staphylococcus aureus bacteremia 01/17/2016  . CAP (community acquired pneumonia) 01/16/2016  . S/P hernia repair 10/28/2015  . Right inguinal hernia 09/09/2015  . GIB (gastrointestinal bleeding) 08/29/2015  . Hematuria 05/19/2015  . Thrombocytopenia ([place]) 11/11/2014  . Leukopenia 11/11/2014  . B12 deficiency 08/31/2013  . Pacemaker 08/26/2013  . On amiodarone therapy 08/21/2013   . Gout due to renal impairment, left ankle and foot 06/13/2013    Class: Acute  . CAD of autologous bypass graft 06/05/2013  . MDS (myelodysplastic syndrome) ([place]) 05/22/2013  . Anemia of chronic disease 03/19/2013  . CKD (chronic kidney disease), stage IV ([place]) 03/18/2013  . NSVT (nonsustained ventricular tachycardia) ([place]) 03/18/2013  . Chronic diastolic heart failure ([place]) 03/17/2013  . Diabetes mellitus ([place]) 03/17/2013  . Essential hypertension 03/17/2013  . Mesenteric artery stenosis ([place]) 05/01/2012  . PAF (paroxysmal atrial fibrillation) ([place]) 12/24/2011  . PAD (peripheral artery disease) ([place]) 12/24/2011  . Chronic vascular insufficiency of intestine ([facility]) 12/20/2011  . Personal history of DVT (deep vein thrombosis) 07/12/2009    Palliative Care Assessment & Plan  HPI: 81 y.o. 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 Diff, DVT in RUE, hypothyroidism, and failure to thrive who presented to his cardiologist office for follow-up after they increased his diuretic and was found to be extremely weak, decreased alertness, and poor oral intake. He was subsequently admitted on 08/11/2016 for failure to thrive. The plan with admission is to attempt to get him feeling better, as well as facilitate a meeting with palliative care to discuss goals of care.  Assessment: I met with Mr. Maceachern and extended family on 2/2. Please see my initial consult note for full details of that conversation. In brief, Mr. Churchill elected to pursue comfort care with Hospice. After evaluation and a follow-up conversation with [facility], they did not feel he meet residential hospice criteria. Of note, the main reason for this was improvement in his oral intake since hospitalization and ability to take oral medication. Prior to admission he was eating minimally (half a meal per day, per pt), however  his appetite has improved since being here and he is eating the majority of breakfast and lunch now. SW was able to determine that he could have Hospice at his SNF for no change in cost. I spoke with his daughter on 2/2 in the evening to relate this, which she was in agreement with.   Unfortunately, today Mr. Bonenberger became febrile with increased respiratory wheeze. I reviewed the most recent CXR from 2/1, and there was concern for atelectasis or pneumonia. I spoke with Mr. Fenlon about my concern for pneumonia and the options to either treat it (which would necessitate IV antibiotics, lab work, and being in the hospital longer) or treat his symptoms and not the infection. After discussing it, he decided that he did want it treated with the goal of medically optimizing before discharge with Hospice. I felt this was a reasonable and appropriate decision. I expressed to him that medical optimization before discharge would give him the best chance to have longer and better time with his family. I also shared that, in light of his improved intake, he would likely have more time than I had originally suggested.   Recommendations/Plan:  DNR, plan to medically optimize then discharge to SNF with Hospice  Discussed with Cardiology (primary team); labs and CXR ordered   Family updated and all in agreement  Symptoms:  Pain/dyspnea: PRN IV dilaudid (avoid morphine d/t renal fxn and risk for toxic metabolite build-up)  C.Diff: Continue oral vancomycin   Wheezing: Start sch duo-nebs, consider starting steroids if no improvement  Constipation: Start senna BID and mira-lax daily  Goals of Care and Additional Recommendations:  Mr. Gruenhagen will leave here with Hospice support, however he would like to be medically optimized with the plan to treat what is treatable prior to discharge.   Code Status:  DNR  Prognosis:   < 6 months; prognosis is difficult to determine at this juncture. Mr. Czaja has  chronic issues with progressive failure to thrive, however his intake has improved since hospitalization and he remains able to take all of his medications without issue. The current pneumonia could shift this timeframe again, however it is too early to say.  Discharge Planning:  Skilled Nursing Facility with Hospice  Care plan was discussed with pt, wife, two daughters, and Cardiology (primary team).  Thank you for allowing the Palliative Medicine Team to assist in the care  of this patient.   Time in: 0900 Time out: 1020  Total time: 100 minutes    Greater than 50%  of this time was spent counseling and coordinating care related to the above assessment and plan.  Charlynn Court, NP Palliative Medicine Team [phone] pager (7a-5p) Team Phone # [phone]

## 2016-08-14 NOTE — Consult Note (Addendum)
[facility] Consult Note History and Physical  [name] [id number] DOB: [birth year] DOA: 08/11/2016  Requesting physician: Haywood Lasso, NP called in for Dr. Debara Pickett Consult for: medical management of issues that are not cardiac related, requested patient be brought on to medical service  PCP: [name], MD   Chief Complaint: "Tired."  HPI: [name] Brow. is a 81 y.o. male  significant for atrial fibrillation, coronary artery disease, CK D, diabetes, gastroparesis, GERD, gout, hypertension. Patient was originally admitted by cardiology on the 31st for weakness and fatigue thought to be due to congestive heart failure. Patient had a fever overnight 102. Patient is still very tired and weak per nursing. No other acute events overnight.   Review of Systems:  Limited ROS due to lethargy.    Past Medical History:  Diagnosis Date  . Angiomyolipoma 2009   On both kidneys noted in 2009  . Arthritis    neck and left wrist  . Atrial fibrillation ([place])   . CAD (coronary artery disease)    s/b CABG 1994, and subsequent stents. Repeat CABG 12/2011,  . Chronic diastolic heart failure ([place])   . CKD (chronic kidney disease)   . Clostridium difficile diarrhea 03/2016  . Depressive disorder   . Diabetes mellitus type 2 with peripheral artery disease (HCC)    DIET CONTROLLED  . DVT (deep venous thrombosis) ([place]) 2011   Right arm  . Gastroparesis   . GERD (gastroesophageal reflux disease)   . Gout   . History of hiatal hernia   . Hyperlipidemia   . Hypertension   . Internal hemorrhoids without mention of complication   . Ischemic colitis ([place])   . 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 Myelodysplastic syndrome ([place]) 05/22/2013   With low hemoglobin and platelets treated with Procrit  . Osteopenia   . Peptic ulcer    S/p partial gastrectomy in 1969  . Peripheral artery disease ([place])   . Pneumonia 01/16/2016  . Pneumonia 03/2016  . Presence of permanent  cardiac pacemaker   . Prostate cancer ([place]) 1997   XRT and lupron  . Renal artery stenosis ([place])   . Sick sinus syndrome ([place])   . Vitamin B 12 deficiency    Past Surgical History:  Procedure Laterality Date  . ABDOMINAL ANGIOGRAM N/A 05/25/2011   Procedure: ABDOMINAL ANGIOGRAM;  Surgeon: Serafina Mitchell, MD;  Location: [facility] CATH LAB;  Service: Cardiovascular;  Laterality: N/A;  . ABDOMINAL ANGIOGRAM N/A 02/13/2013   Procedure: ABDOMINAL ANGIOGRAM;  Surgeon: Serafina Mitchell, MD;  Location: [facility] CATH LAB;  Service: Cardiovascular;  Laterality: N/A;  . APPENDECTOMY  1991  . CELIAC ARTERY ANGIOPLASTY  05-16-12   and stenting  . CHOLECYSTECTOMY  Oct 2009   Laparoscopic  . CORONARY ARTERY BYPASS GRAFT  01/22/1993  . CORONARY ARTERY BYPASS GRAFT  01/03/2012   Procedure: REDO CORONARY ARTERY BYPASS GRAFTING (CABG);  Surgeon: Gaye Pollack, MD;  Location: [place];  Service: Open Heart Surgery;  Laterality: N/A;  Redo CABG x  using bilateral internal mammary arteries;  left leg greater saphenous vein harvested endoscopically  . FOREIGN BODY REMOVAL ABDOMINAL Right 01/27/2016   Procedure: EXPOSURE OF RIGHT COMMON FEMORAL ARTERY AND REMOVAL FOREIGN;  Surgeon: Evans Lance, MD;  Location: [place];  Service: Cardiovascular;  Laterality: Right;  . HIATAL HERNIA REPAIR     and ulcer repair  . I&D EXTREMITY Left 01/21/2016   Procedure: IRRIGATION AND DEBRIDEMENT EXTREMITY;  Surgeon: Milly Jakob, MD;  Location: [place];  Service: Orthopedics;  Laterality: Left;  . INGUINAL HERNIA REPAIR Right 10/28/2015   Procedure: OPEN RIGHT INGUINAL HERNIA REPAIR;  Surgeon: Greer Pickerel, MD;  Location: [facility];  Service: General;  Laterality: Right;  . INSERTION OF MESH Right 10/28/2015   Procedure: INSERTION OF MESH;  Surgeon: Greer Pickerel, MD;  Location: [facility];  Service: General;  Laterality: Right;  . IR GENERIC HISTORICAL  02/02/2016   IR FLUORO GUIDE CV LINE LEFT 02/02/2016 Markus Daft, MD [facility]  . IR GENERIC HISTORICAL   02/02/2016   IR US GUIDE VASC ACCESS LEFT 02/02/2016 Markus Daft, MD [facility]  . IR GENERIC HISTORICAL  03/31/2016   IR REMOVAL TUN CV CATH W/O FL 03/31/2016 Arne Cleveland, MD [facility]  . LEFT HEART CATHETERIZATION WITH CORONARY/GRAFT ANGIOGRAM  12/24/2011   Procedure: LEFT HEART CATHETERIZATION WITH Beatrix Fetters;  Surgeon: Sinclair Grooms, MD;  Location: [facility] CATH LAB;  Service: Cardiovascular;;  . LOWER EXTREMITY ANGIOGRAM Bilateral 05/25/2011   Procedure: LOWER EXTREMITY ANGIOGRAM;  Surgeon: Serafina Mitchell, MD;  Location: [facility] CATH LAB;  Service: Cardiovascular;  Laterality: Bilateral;  . OTHER SURGICAL HISTORY  02/13/13   superior mesenteric artery angiogram  . OTHER SURGICAL HISTORY  05/16/12   Stent in stomach  . PACEMAKER GENERATOR CHANGE  12/10/2003   SJM Identity XL DR performed by Dr Leonia Reeves  . PACEMAKER INSERTION  10/18/1994   DDD pacemaker, [employer]. Gen change 12/10/2003.  Marland Kitchen PACEMAKER LEAD REMOVAL Right 01/27/2016   Procedure: PACEMAKER EXTRACTION;  Surgeon: Evans Lance, MD;  Location: [place];  Service: Cardiovascular;  Laterality: Right;  DR. Roxy Manns TO BACKUP CASE  . PARTIAL GASTRECTOMY  1969   Hx of ulcer s/p partial gastrectomy/ has pernicious anemia  . RENAL ANGIOGRAM N/A 02/13/2013   Procedure: RENAL ANGIOGRAM;  Surgeon: Serafina Mitchell, MD;  Location: [facility] CATH LAB;  Service: Cardiovascular;  Laterality: N/A;  . TEE WITHOUT CARDIOVERSION N/A 01/26/2016   Procedure: TRANSESOPHAGEAL ECHOCARDIOGRAM (TEE);  Surgeon: Sueanne Margarita, MD;  Location: [facility] ENDOSCOPY;  Service: Cardiovascular;  Laterality: N/A;  . TEE WITHOUT CARDIOVERSION N/A 01/27/2016   Procedure: TRANSESOPHAGEAL ECHOCARDIOGRAM (TEE);  Surgeon: Evans Lance, MD;  Location: [place];  Service: Cardiovascular;  Laterality: N/A;  . TEE WITHOUT CARDIOVERSION N/A 04/12/2016   Procedure: TRANSESOPHAGEAL ECHOCARDIOGRAM (TEE);  Surgeon: Dorothy Spark, MD;  Location: [place];  Service: Cardiovascular;   Laterality: N/A;  . THROMBECTOMY / EMBOLECTOMY SUBCLAVIAN ARTERY  02/02/10   Right subclavian thromboectomy and venous angioplasty, and chronic mesenteric ischemia with Herculink stenting to superior mesenteric and celiac arteries - Dr. Trula Slade  . TRANSURETHRAL RESECTION OF PROSTATE N/A 02/22/2016   Procedure: CYSTO, CLOT EVACUATION, FULGERATION;  Surgeon: Alexis Frock, MD;  Location: [facility];  Service: Urology;  Laterality: N/A;  . VISCERAL ANGIOGRAM N/A 05/25/2011   Procedure: VISCERAL ANGIOGRAM;  Surgeon: Serafina Mitchell, MD;  Location: [facility] CATH LAB;  Service: Cardiovascular;  Laterality: N/A;  . VISCERAL ANGIOGRAM Bilateral 12/28/2011   Procedure: VISCERAL ANGIOGRAM;  Surgeon: Serafina Mitchell, MD;  Location: [facility] CATH LAB;  Service: Cardiovascular;  Laterality: Bilateral;  . VISCERAL ANGIOGRAM N/A 05/16/2012   Procedure: VISCERAL ANGIOGRAM;  Surgeon: Serafina Mitchell, MD;  Location: [facility] CATH LAB;  Service: Cardiovascular;  Laterality: N/A;  . VISCERAL ANGIOGRAM N/A 02/13/2013   Procedure: VISCERAL ANGIOGRAM;  Surgeon: Serafina Mitchell, MD;  Location: [facility] CATH LAB;  Service: Cardiovascular;  Laterality: N/A;   Social History:  reports that he quit smoking about 47 years ago. His smoking use included Cigarettes. He quit after 20.00 years of use. He has never used smokeless tobacco. He reports that he does not drink alcohol or use drugs.  Allergies  Allergen Reactions  . Nsaids Nausea Only    Reaction:  GI upset   . Ibuprofen Other (See Comments)    Reaction:  GI upset   . Ace Inhibitors Cough    Family History  Problem Relation Age of Onset  . Diabetes Mother   . Heart disease Mother     Heart Disease before age 3  . Hyperlipidemia Mother   . Hypertension Mother   . CVA Mother 47    cause of death  . Cancer Father     stomach/liver  . Hypertension Father     possibly hypertensive  . Cancer Sister     Breast cancer  . CAD Brother   . Heart disease Brother   . Cancer Paternal Uncle      colon  . Heart disease Sister   . Diabetes Sister   . Hypertension Sister   . Heart disease Daughter     Heart Disease before age 60  . Hypertension Daughter   . Heart attack Daughter      Prior to Admission medications   Medication Sig Start Date End Date Taking? Authorizing Provider  acetaminophen (TYLENOL) 650 MG CR tablet Take 650 mg by mouth 2 (two) times daily.    Yes Historical Provider, MD  Amino Acids-Protein Hydrolys (FEEDING SUPPLEMENT, PRO-STAT SUGAR FREE 64,) LIQD Take 30 mLs by mouth 2 (two) times daily with a meal.    Yes Historical Provider, MD  amiodarone (PACERONE) 100 MG tablet Take 100 mg by mouth daily.    Yes Historical Provider, MD  amLODipine (NORVASC) 5 MG tablet Take 1 tablet (5 mg total) by mouth daily. 07/01/16  Yes Velvet Bathe, MD  atorvastatin (LIPITOR) 80 MG tablet Take 80 mg by mouth at bedtime.    Yes Historical Provider, MD  calcium carbonate (OSCAL) 1500 (600 Ca) MG TABS tablet Take 600 mg of elemental calcium by mouth daily with breakfast.   Yes Historical Provider, MD  carvedilol (COREG) 25 MG tablet Take 1 tablet (25 mg total) by mouth 2 (two) times daily with a meal. 04/16/16  Yes Nishant Dhungel, MD  ciprofloxacin (CIPRO) 500 MG tablet Take 500 mg by mouth 2 (two) times daily.   Yes Historical Provider, MD  cyanocobalamin (,VITAMIN B-12,) 1000 MCG/ML injection Inject 1,000 mcg into the muscle every 30 (thirty) days.    Yes Historical Provider, MD  epoetin alfa (EPOGEN,PROCRIT) 16109 UNIT/ML injection Inject 10,000 Units into the skin every 14 (fourteen) days.    Yes Historical Provider, MD  feeding supplement, GLUCERNA SHAKE, (GLUCERNA SHAKE) LIQD Take 237 mLs by mouth 2 (two) times daily between meals. To support nutritional status   Yes Historical Provider, MD  ferrous sulfate 325 (65 FE) MG tablet Take 1 tablet (325 mg total) by mouth 3 (three) times daily with meals. 02/24/16  Yes Venetia Maxon Rama, MD  furosemide (LASIX) 80 MG tablet Take 1 tablet  (80 mg total) by mouth 2 (two) times daily. Patient taking differently: Take 160 mg by mouth 2 (two) times daily.  07/19/16 10/17/16 Yes Belva Crome, MD  hydrALAZINE (APRESOLINE) 25 MG tablet Take 3 tablets (75 mg total) by mouth 4 (four) times daily. 02/24/16  Yes Venetia Maxon Rama, MD  isosorbide mononitrate (IMDUR) 120  MG 24 hr tablet Take 120 mg by mouth daily.    Yes Historical Provider, MD  levothyroxine (SYNTHROID) 50 MCG tablet Take 1 tablet (50 mcg total) by mouth daily before breakfast. 06/22/16  Yes Belva Crome, MD  Multiple Vitamin (MULTIVITAMIN WITH MINERALS) TABS tablet Take 1 tablet by mouth daily.    Yes Historical Provider, MD  Nutritional Supplements (FEEDING SUPPLEMENT, GLUCERNA 1.2 CAL,) LIQD Take 237 mLs by mouth 2 (two) times daily as needed (for nutritional supplement).    Yes Historical Provider, MD  nystatin (NYSTATIN) powder Apply 1 g topically 2 (two) times daily. To groin area for candida   Yes Historical Provider, MD  potassium chloride SA (K-DUR,KLOR-CON) 20 MEQ tablet Take 20 mEq by mouth 2 (two) times daily.   Yes Historical Provider, MD  Saccharomyces boulardii (PROBIOTIC) 250 MG CAPS Take 1 capsule by mouth 2 (two) times daily.   Yes Historical Provider, MD  silodosin (RAPAFLO) 8 MG CAPS capsule Take 8 mg by mouth at bedtime.    Yes Historical Provider, MD  warfarin (COUMADIN) 5 MG tablet Take one (1) tablet (5 mg total) by mouth daily Monday to Saturday.   Yes Historical Provider, MD  warfarin (COUMADIN) 6 MG tablet Take one (1) tablets (6 mg total) by mouth daily on Sundays.   Yes Historical Provider, MD  leuprolide (LUPRON) 30 MG injection Inject 30 mg into the muscle every 4 (four) months.     Historical Provider, MD   Physical Exam: Vitals:   08/14/16 0425 08/14/16 0934 08/14/16 1140 08/14/16 1244  BP: (!) 151/65 (!) 170/69  132/64  Pulse: (!) 105 (!) 105  91  Resp: 18 (!) 22  20  Temp: (!) 101.1 F (38.4 C) 99.9 F (37.7 C)  (!) 102 F (38.9 C)  TempSrc:  Oral Oral  Oral  SpO2: 97% 100% 100% 100%  Weight: 83.6 kg (184 lb 4.8 oz)     Height:        Wt Readings from Last 3 Encounters:  08/14/16 83.6 kg (184 lb 4.8 oz)  07/30/16 90.8 kg (200 lb 3.2 oz)  07/23/16 78.9 kg (174 lb)    General:  Appears calm and comfortable, sleepy Eyes:  PERRL, EOMI, normal lids, iris ENT:  grossly normal hearing, lips & tongue Neck:  no LAD, masses or thyromegaly Cardiovascular:  RRR, no m/r/g. Bil 2+ edema.  Respiratory:  Decr air movement, no w/r/ but rales present in lun bases. Mild incr wob. Tachypnea. Abdomen:  soft, nd, RUQ/RLQ tenderness Skin:  no rash or induration seen on limited exam Musculoskeletal:  grossly normal tone BUE/BLE Psychiatric:  grossly normal mood and affect, speech fluent and appropriate Neurologic:  CN limited exam due to sleepiness, moves all extremities .          Labs on Admission:  Basic Metabolic Panel:  Recent Labs Lab 08/11/16 1343 08/11/16 1850 08/12/16 0704 08/13/16 0448  NA 136 136 136 135  K 2.8* 2.5* 2.5* 3.5  CL 100* 100* 101 101  CO2 23 23 23  21*  GLUCOSE 196* 171* 200* 200*  BUN 91* 90* 91* 96*  CREATININE 3.38* 3.27* 3.18* 3.26*  CALCIUM 8.4* 8.0* 7.9* 8.0*  MG  --  1.3*  --   --    Liver Function Tests:  Recent Labs Lab 08/11/16 1343 08/11/16 1850  AST 46* 41  ALT 17 15*  ALKPHOS 56 55  BILITOT 0.3 0.3  PROT 7.3 7.0  ALBUMIN 1.8* 1.8*  Recent Labs Lab 08/11/16 1343  LIPASE 71*    Recent Labs Lab 08/11/16 1343  AMMONIA 13   CBC:  Recent Labs Lab 08/11/16 1343 08/11/16 1850  WBC 12.8* 14.2*  NEUTROABS 10.5* 11.7*  HGB 8.3* 8.1*  HCT 25.6* 25.1*  MCV 86.8 85.7  PLT 241 261   Cardiac Enzymes:  Recent Labs Lab 08/11/16 1850 08/12/16 0120 08/12/16 0704  TROPONINI 0.07* 0.06* 0.06*    BNP (last 3 results)  Recent Labs  06/25/16 0340 08/11/16 1343 08/11/16 1850  BNP 1,430.8* 355.1* 319.0*    ProBNP (last 3 results) No results for input(s): PROBNP in  the last 8760 hours.   Serum creatinine: 3.26 mg/dL High 08/13/16 0448 Estimated creatinine clearance: 19.3 mL/min  CBG: No results for input(s): GLUCAP in the last 168 hours.  Radiological Exams on Admission: Dg Chest Port 1 View  Result Date: 08/14/2016 CLINICAL DATA:  81 year old male with acute on chronic heart failure. Wheezing and lethargy. Initial encounter. EXAM: PORTABLE CHEST 1 VIEW COMPARISON:  08/12/2016 and earlier. FINDINGS: Portable AP semi upright view at 0924 hours. Mildly improved lung volumes and bibasilar ventilation. Stable cardiomegaly and mediastinal contours. Calcified aortic atherosclerosis. No pneumothorax or pulmonary edema. No definite pleural effusion or consolidation. Residual patchy bibasilar opacity most resembles atelectasis. Negative visible bowel gas pattern. IMPRESSION: Improved lung volumes with regressed bibasilar atelectasis. Stable cardiomegaly and Calcified aortic atherosclerosis. No pulmonary edema. Electronically Signed   By: Genevie Ann M.D.   On: 08/14/2016 09:36     I independently reviewed the chest x-ray and agree with the impression of the radiologist. Findings: Atelectasis.  EKG: Independently reviewed. 2/1, poor tracing, no STEMI  Assessment/Plan Active Problems:   Diabetes mellitus (HCC)   CKD (chronic kidney disease), stage IV (HCC)   Anemia of chronic disease   CAD of autologous bypass graft   Acute renal failure superimposed on stage 4 chronic kidney disease (HCC)   HCAP (healthcare-associated pneumonia)   C. difficile colitis   Acute on chronic diastolic CHF (congestive heart failure) (HCC)   Warfarin anticoagulation   Failure to thrive (0-17)   Malnutrition of moderate degree   Palliative care by specialist   AKI (acute kidney injury) ([place])   Fever Blood cultures pending Getting  resp viral panel LA pending   UTI  Dirty UA 1/31  Starting zosyn  Asking nursing to get sample today for culture, checking PVR since pt has hx  of retention, may need more flomax   Cdiff pos  On oral vanc given history of recurrent C. difficile will escalate treatment by adding IV Flagyl  Flagyl can be switched to PO tomorrow if pt doing better  CT abdomen w/o cont ordered   Abn CXR  Re-image in AM, do not feel this is pna but this could develop into one, will increase pulm toilet  Dyspnea IS ordered Likely related to cardiac issues No acute infectious process on CXR today Wheeze on exam noted Sch duonebs Prn albuterol RT consult  FTT Nl ammonia level on admit  Hypothyroidism Chart review shows that month ago patient's TSH was 22 per report his Synthroid was increased but now his TSH is higher at 32 Awaiting free T4 Will likely need to increase patient's Synthroid  Electrolyte abnormalities Will monitor replace as necessary IV and by mouth Checking magnesium and phosphorus today  Acute chronic diastolic heart failure Likely some element of cardiorenal Management per cardiology  PAF Management per cardiology  Acute on chronic renal failure Management  per cardiology, they feel it is cardiorenal Hypertension When necessary lopressor 5 mg IV as needed for severe blood pressure  DVT Cont coumadin  DM SSI  Hyperlipidemia Hold statin  Family - Called pt's wife and left VM  Will take pt on to medicine service.  Thank you for allowing [place] to share in the care of this patient.  Elwin Mocha, MD Family Medicine [facility] [email or web address] Password [email or web address]

## 2016-08-14 NOTE — Progress Notes (Addendum)
Pharmacy Antibiotic Note  Numa Bachmann. is a 81 y.o. male admitted on 08/11/2016 with sepsis 2/2 probable pneumonia. Pharmacy has been consulted for Zosyn dosing. He is also on PO vancomycin + IV flagyl for cdiff. Tmax 102, WBC 14.2. Poor renal function noted.   Plan: Zosyn 2.25g IV q6hr  Monitor renal function, clinical picture, and cultures Follow-up length of therapy   Height: 5\' 10"  (177.8 cm) Weight: 184 lb 4.8 oz (83.6 kg) IBW/kg (Calculated) : 73  Temp (24hrs), Avg:100.5 F (38.1 C), Min:97.5 F (36.4 C), Max:102 F (38.9 C)   Recent Labs Lab 08/11/16 1343 08/11/16 1850 08/12/16 0704 08/13/16 0448  WBC 12.8* 14.2*  --   --   CREATININE 3.38* 3.27* 3.18* 3.26*    Estimated Creatinine Clearance: 19.3 mL/min (by C-G formula based on SCr of 3.26 mg/dL (H)).    Allergies  Allergen Reactions  . Nsaids Nausea Only    Reaction:  GI upset   . Ibuprofen Other (See Comments)    Reaction:  GI upset   . Ace Inhibitors Cough    Antimicrobials this admission: 2/3 Zosyn >>  2/3 Flagyl >>  2/2 PO Vanc >>   Microbiology results: 1/31 MRSA PCR - positive 2/1 Cdiff antigen/toxin - positive   Argie Ramming, PharmD Pharmacy Resident  Pager [phone] 08/14/16 11:11 AM

## 2016-08-14 NOTE — Progress Notes (Signed)
ANTICOAGULATION CONSULT NOTE - Follow Up Consult  Pharmacy Consult for Coumadin Indication: afib and hx of DVT  Allergies  Allergen Reactions  . Nsaids Nausea Only    Reaction:  GI upset   . Ibuprofen Other (See Comments)    Reaction:  GI upset   . Ace Inhibitors Cough    Patient Measurements: Height: 5\' 10"  (177.8 cm) Weight: 184 lb 4.8 oz (83.6 kg) IBW/kg (Calculated) : 73   Vital Signs: Temp: 99.9 F (37.7 C) (02/03 0934) Temp Source: Oral (02/03 0934) BP: 170/69 (02/03 0934) Pulse Rate: 105 (02/03 0934)  Labs:  Recent Labs  08/11/16 1343 08/11/16 1850 08/12/16 0120 08/12/16 0704 08/12/16 0955 08/13/16 0448  HGB 8.3* 8.1*  --   --   --   --   HCT 25.6* 25.1*  --   --   --   --   PLT 241 261  --   --   --   --   LABPROT 28.1* 24.3*  --   --  31.1* 31.1*  INR 2.57 2.15  --   --  2.91 2.92  CREATININE 3.38* 3.27*  --  3.18*  --  3.26*  TROPONINI  --  0.07* 0.06* 0.06*  --   --     Estimated Creatinine Clearance: 19.3 mL/min (by C-G formula based on SCr of 3.26 mg/dL (H)).  Assessment: [name] continues on coumadin for afib and hx DVT. INR therapeutic at 2.92. Last Hgb 1/31 (low, but stable). No s/s bleeding.   PTA dose: 5mg  daily except 6mg  on Sun  Goal of Therapy:  INR 2-3 Monitor platelets by anticoagulation protocol: Yes   Plan:  1) Coumadin 5mg  x 1 tonight  2) Daily INR 3) CBC as needed - next draw today 2/3  4) Monitor for s/s bleeding   Argie Ramming, PharmD Pharmacy Resident  Pager [phone] 08/14/16 11:21 AM

## 2016-08-15 DIAGNOSIS — Z903 Acquired absence of stomach [part of]: Secondary | ICD-10-CM

## 2016-08-15 DIAGNOSIS — Z886 Allergy status to analgesic agent status: Secondary | ICD-10-CM

## 2016-08-15 DIAGNOSIS — Z9049 Acquired absence of other specified parts of digestive tract: Secondary | ICD-10-CM

## 2016-08-15 DIAGNOSIS — Z951 Presence of aortocoronary bypass graft: Secondary | ICD-10-CM

## 2016-08-15 DIAGNOSIS — R7881 Bacteremia: Secondary | ICD-10-CM

## 2016-08-15 DIAGNOSIS — Z800 Family history of malignant neoplasm of digestive organs: Secondary | ICD-10-CM

## 2016-08-15 DIAGNOSIS — Z888 Allergy status to other drugs, medicaments and biological substances status: Secondary | ICD-10-CM

## 2016-08-15 DIAGNOSIS — Z8249 Family history of ischemic heart disease and other diseases of the circulatory system: Secondary | ICD-10-CM

## 2016-08-15 DIAGNOSIS — R531 Weakness: Secondary | ICD-10-CM

## 2016-08-15 DIAGNOSIS — Z8349 Family history of other endocrine, nutritional and metabolic diseases: Secondary | ICD-10-CM

## 2016-08-15 DIAGNOSIS — Z833 Family history of diabetes mellitus: Secondary | ICD-10-CM

## 2016-08-15 DIAGNOSIS — Z950 Presence of cardiac pacemaker: Secondary | ICD-10-CM

## 2016-08-15 DIAGNOSIS — Z8679 Personal history of other diseases of the circulatory system: Secondary | ICD-10-CM

## 2016-08-15 DIAGNOSIS — A4902 Methicillin resistant Staphylococcus aureus infection, unspecified site: Secondary | ICD-10-CM

## 2016-08-15 DIAGNOSIS — Z66 Do not resuscitate: Secondary | ICD-10-CM

## 2016-08-15 DIAGNOSIS — Z823 Family history of stroke: Secondary | ICD-10-CM

## 2016-08-15 DIAGNOSIS — A0471 Enterocolitis due to Clostridium difficile, recurrent: Secondary | ICD-10-CM

## 2016-08-15 DIAGNOSIS — Z87891 Personal history of nicotine dependence: Secondary | ICD-10-CM

## 2016-08-15 LAB — CBC WITH DIFFERENTIAL/PLATELET
Basophils Absolute: 0 10*3/uL (ref 0.0–0.1)
Basophils Relative: 0 %
Eosinophils Absolute: 0 10*3/uL (ref 0.0–0.7)
Eosinophils Relative: 0 %
HEMATOCRIT: 21.2 % — AB (ref 39.0–52.0)
HEMOGLOBIN: 6.8 g/dL — AB (ref 13.0–17.0)
LYMPHS PCT: 3 %
Lymphs Abs: 0.5 10*3/uL — ABNORMAL LOW (ref 0.7–4.0)
MCH: 26.7 pg (ref 26.0–34.0)
MCHC: 32.1 g/dL (ref 30.0–36.0)
MCV: 83.1 fL (ref 78.0–100.0)
MONO ABS: 1.1 10*3/uL — AB (ref 0.1–1.0)
MONOS PCT: 7 %
NEUTROS ABS: 13.6 10*3/uL — AB (ref 1.7–7.7)
NEUTROS PCT: 90 %
Platelets: 235 10*3/uL (ref 150–400)
RBC: 2.55 MIL/uL — ABNORMAL LOW (ref 4.22–5.81)
RDW: 19.2 % — AB (ref 11.5–15.5)
WBC: 15.1 10*3/uL — ABNORMAL HIGH (ref 4.0–10.5)

## 2016-08-15 LAB — BLOOD CULTURE ID PANEL (REFLEXED)
ACINETOBACTER BAUMANNII: NOT DETECTED
CANDIDA ALBICANS: NOT DETECTED
CANDIDA GLABRATA: NOT DETECTED
CANDIDA KRUSEI: NOT DETECTED
CANDIDA PARAPSILOSIS: NOT DETECTED
CANDIDA TROPICALIS: NOT DETECTED
ENTEROBACTERIACEAE SPECIES: NOT DETECTED
ESCHERICHIA COLI: NOT DETECTED
Enterobacter cloacae complex: NOT DETECTED
Enterococcus species: NOT DETECTED
HAEMOPHILUS INFLUENZAE: NOT DETECTED
KLEBSIELLA OXYTOCA: NOT DETECTED
KLEBSIELLA PNEUMONIAE: NOT DETECTED
Listeria monocytogenes: NOT DETECTED
Methicillin resistance: DETECTED — AB
Neisseria meningitidis: NOT DETECTED
PSEUDOMONAS AERUGINOSA: NOT DETECTED
Proteus species: NOT DETECTED
STREPTOCOCCUS PNEUMONIAE: NOT DETECTED
STREPTOCOCCUS PYOGENES: NOT DETECTED
Serratia marcescens: NOT DETECTED
Staphylococcus aureus (BCID): DETECTED — AB
Staphylococcus species: DETECTED — AB
Streptococcus agalactiae: NOT DETECTED
Streptococcus species: NOT DETECTED

## 2016-08-15 LAB — BASIC METABOLIC PANEL
ANION GAP: 11 (ref 5–15)
BUN: 106 mg/dL — ABNORMAL HIGH (ref 6–20)
CALCIUM: 8 mg/dL — AB (ref 8.9–10.3)
CHLORIDE: 103 mmol/L (ref 101–111)
CO2: 20 mmol/L — AB (ref 22–32)
CREATININE: 2.93 mg/dL — AB (ref 0.61–1.24)
GFR calc non Af Amer: 19 mL/min — ABNORMAL LOW (ref >60)
GFR, EST AFRICAN AMERICAN: 22 mL/min — AB (ref >60)
Glucose, Bld: 174 mg/dL — ABNORMAL HIGH (ref 65–99)
Potassium: 4.3 mmol/L (ref 3.5–5.1)
Sodium: 134 mmol/L — ABNORMAL LOW (ref 135–145)

## 2016-08-15 LAB — MAGNESIUM: Magnesium: 1.3 mg/dL — ABNORMAL LOW (ref 1.7–2.4)

## 2016-08-15 LAB — GLUCOSE, CAPILLARY
GLUCOSE-CAPILLARY: 180 mg/dL — AB (ref 65–99)
GLUCOSE-CAPILLARY: 160 mg/dL — AB (ref 65–99)
Glucose-Capillary: 128 mg/dL — ABNORMAL HIGH (ref 65–99)

## 2016-08-15 LAB — PREPARE RBC (CROSSMATCH)

## 2016-08-15 LAB — PHOSPHORUS: Phosphorus: 3.3 mg/dL (ref 2.5–4.6)

## 2016-08-15 MED ORDER — HYDRALAZINE HCL 50 MG PO TABS
50.0000 mg | ORAL_TABLET | Freq: Three times a day (TID) | ORAL | Status: DC
  Administered 2016-08-15 – 2016-08-18 (×7): 50 mg via ORAL
  Filled 2016-08-15 (×8): qty 1

## 2016-08-15 MED ORDER — VANCOMYCIN HCL IN DEXTROSE 1-5 GM/200ML-% IV SOLN
1000.0000 mg | INTRAVENOUS | Status: DC
  Filled 2016-08-15: qty 200

## 2016-08-15 MED ORDER — PIPERACILLIN-TAZOBACTAM 3.375 G IVPB
3.3750 g | Freq: Three times a day (TID) | INTRAVENOUS | Status: DC
  Filled 2016-08-15: qty 50

## 2016-08-15 MED ORDER — MAGNESIUM SULFATE 2 GM/50ML IV SOLN
2.0000 g | Freq: Once | INTRAVENOUS | Status: AC
  Administered 2016-08-15: 2 g via INTRAVENOUS
  Filled 2016-08-15: qty 50

## 2016-08-15 MED ORDER — WARFARIN SODIUM 2 MG PO TABS
4.0000 mg | ORAL_TABLET | Freq: Once | ORAL | Status: AC
  Administered 2016-08-15: 4 mg via ORAL
  Filled 2016-08-15: qty 2

## 2016-08-15 MED ORDER — IPRATROPIUM-ALBUTEROL 0.5-2.5 (3) MG/3ML IN SOLN
3.0000 mL | Freq: Three times a day (TID) | RESPIRATORY_TRACT | Status: DC
Start: 2016-08-15 — End: 2016-08-16
  Administered 2016-08-15 – 2016-08-16 (×2): 3 mL via RESPIRATORY_TRACT
  Filled 2016-08-15 (×2): qty 3

## 2016-08-15 MED ORDER — VANCOMYCIN HCL 10 G IV SOLR
1500.0000 mg | Freq: Once | INTRAVENOUS | Status: AC
  Administered 2016-08-15: 1500 mg via INTRAVENOUS
  Filled 2016-08-15: qty 1500

## 2016-08-15 MED ORDER — SODIUM CHLORIDE 0.9 % IV SOLN
Freq: Once | INTRAVENOUS | Status: AC
  Administered 2016-08-15: 19:00:00 via INTRAVENOUS

## 2016-08-15 NOTE — Progress Notes (Addendum)
Pharmacy Antibiotic Note  [name]. is a 81 y.o. male admitted on 08/11/2016 with sepsis 2/2 probable pneumonia. Pharmacy has been consulted for Zosyn dosing. He is also on PO vancomycin + IV flagyl for cdiff. Tmax 102, WBC downtrending from 20.6 to 15.1. Poor renal function noted, however, due to slight improvement will dose adjust Zosyn.   ADDENDUM 2/4 1100 AM:  BCID showing MRSA Pharmacy consulted to dose Vancomycin Start Vancomycin 1.5g IV x1, then 1g IV q24hr Vancomycin levels as needed Monitor renal function closely   Plan: Increase Zosyn to 3.375g IV q8hr  Monitor renal function, clinical picture, and cultures Follow-up length of therapy   Height: 5\' 10"  (177.8 cm) Weight: 181 lb (82.1 kg) IBW/kg (Calculated) : 73  Temp (24hrs), Avg:100 F (37.8 C), Min:98.2 F (36.8 C), Max:102 F (38.9 C)   Recent Labs Lab 08/11/16 1343 08/11/16 1850 08/12/16 0704 08/13/16 0448 08/14/16 1356 08/15/16 0439  WBC 12.8* 14.2*  --   --  20.6* 15.1*  CREATININE 3.38* 3.27* 3.18* 3.26* 3.01* 2.93*  LATICACIDVEN  --   --   --   --  1.8  --     Estimated Creatinine Clearance: 21.5 mL/min (by C-G formula based on SCr of 2.93 mg/dL (H)).    Allergies  Allergen Reactions  . Nsaids Nausea Only    Reaction:  GI upset   . Ibuprofen Other (See Comments)    Reaction:  GI upset   . Ace Inhibitors Cough    Antimicrobials this admission: 2/4 IV Vanc >>  2/3 Zosyn >> 2/3 Flagyl >> 2/2 PO Vanc >>  Dose changes: 2/4: Zosyn 2.25g q6 > 3.375g q8  Microbiology results: 1/31 MRSA PCR - positive 2/1 Cdiff antigen/toxin - positive  2/3 Resp panel - neg 2/3 Blood Cx - 2/2 GPCs clusters, BCID with MRSA   Argie Ramming, PharmD Pharmacy Resident  Pager [phone] 08/15/16 7:19 AM

## 2016-08-15 NOTE — Progress Notes (Signed)
ANTICOAGULATION CONSULT NOTE - Follow Up Consult  Pharmacy Consult for Coumadin Indication: afib and hx of DVT  Allergies  Allergen Reactions  . Nsaids Nausea Only    Reaction:  GI upset   . Ibuprofen Other (See Comments)    Reaction:  GI upset   . Ace Inhibitors Cough    Patient Measurements: Height: 5\' 10"  (177.8 cm) Weight: 181 lb (82.1 kg) IBW/kg (Calculated) : 73   Vital Signs: Temp: 99.5 F (37.5 C) (02/04 0635) Temp Source: Axillary (02/04 0635) BP: 124/55 (02/04 0635) Pulse Rate: 79 (02/04 0635)  Labs:  Recent Labs  08/12/16 0955 08/13/16 0448 08/14/16 1356 08/15/16 0439  HGB  --   --  7.4* 6.8*  HCT  --   --  22.7* 21.2*  PLT  --   --  272 235  LABPROT 31.1* 31.1* 31.5*  --   INR 2.91 2.92 2.97  --   CREATININE  --  3.26* 3.01* 2.93*    Estimated Creatinine Clearance: 21.5 mL/min (by C-G formula based on SCr of 2.93 mg/dL (H)).  Assessment: 80yom continues on coumadin for afib and hx DVT. INR therapeutic at 2.97. Hgb downtrending 7.4 > 6.8. Plt normal. No s/s bleeding. Will empirically reduce dose of warfarin to keep within INR goal range.   PTA dose: 5mg  daily except 6mg  on Sun  Goal of Therapy:  INR 2-3 Monitor platelets by anticoagulation protocol: Yes   Plan:  1) Coumadin 4mg  x 1 tonight  2) Daily INR 3) CBC as needed 4) Monitor for s/s bleeding   Argie Ramming, PharmD Pharmacy Resident  Pager [phone] 08/15/16 7:18 AM

## 2016-08-15 NOTE — Progress Notes (Signed)
Daily Progress Note   Patient Name: [name] WAGY Sr.       Date: 08/15/2016 DOB: [birth year]  Age: 81 y.o. MRN#: [id number] Attending Physician: Theodis Blaze, MD Primary Care Physician: [name], MD Admit Date: 08/11/2016  Reason for Consultation/Follow-up: Establishing goals of care and Psychosocial/spiritual support  Subjective: - nursing called with concerns that family was questioning the DNR bractlet  -continued conversation at bedside with family regarding GOCs  -at this time plan is to treat the treatable, they remain hopeful for improvement but all support the DNR status  -questions and concerns addressed  Length of Stay: 4  Current Medications: Scheduled Meds:  . sodium chloride   Intravenous Once  . amiodarone  100 mg Oral Daily  . amLODipine  5 mg Oral Daily  . carvedilol  25 mg Oral BID WC  . Chlorhexidine Gluconate Cloth  6 each Topical Q0600  . feeding supplement (ENSURE ENLIVE)  237 mL Oral BID BM  . feeding supplement (GLUCERNA SHAKE)  237 mL Oral BID BM  . hydrALAZINE  75 mg Oral Q6H  . insulin aspart  0-15 Units Subcutaneous TID WC  . ipratropium-albuterol  3 mL Nebulization TID  . isosorbide mononitrate  120 mg Oral Daily  . levothyroxine  50 mcg Oral QAC breakfast  . magnesium sulfate 1 - 4 g bolus IVPB  2 g Intravenous Once  . mupirocin ointment  1 application Nasal BID  . nutrition supplement (JUVEN)  1 packet Oral BID WC  . polyethylene glycol  17 g Oral Daily  . senna  1 tablet Oral BID  . sodium chloride flush  3 mL Intravenous Q12H  . tamsulosin  0.4 mg Oral QPC supper  . vancomycin  125 mg Oral QID  . [START ON 08/16/2016] vancomycin  1,000 mg Intravenous Q24H  . warfarin  4 mg Oral ONCE-1800  . Warfarin - Pharmacist Dosing Inpatient    Does not apply q1800    Continuous Infusions: . dextrose 5 % and 0.45 % NaCl with KCl 40 mEq/L Stopped (08/14/16 1910)    PRN Meds: sodium chloride, acetaminophen, albuterol, HYDROmorphone (DILAUDID) injection, metoprolol, ondansetron (ZOFRAN) IV, sodium chloride flush  Physical Exam          Vital Signs: BP (!) 93/55 (BP Location: Right Arm)   Pulse 68  Temp 98.4 F (36.9 C) (Oral)   Resp 18   Ht 5\' 10"  (1.778 m)   Wt 82.1 kg (181 lb)   SpO2 100%   BMI 25.97 kg/m  SpO2: SpO2: 100 % O2 Device: O2 Device: Nasal Cannula O2 Flow Rate: O2 Flow Rate (L/min): 2 L/min  Intake/output summary:  Intake/Output Summary (Last 24 hours) at 08/15/16 1559 Last data filed at 08/15/16 1442  Gross per 24 hour  Intake              600 ml  Output                0 ml  Net              600 ml   LBM: Last BM Date: 08/15/16 Baseline Weight: Weight: 90.7 kg (200 lb) Most recent weight: Weight: 82.1 kg (181 lb)       Palliative Assessment/Data:  30 % at best    Flowsheet Rows   Flowsheet Row Most Recent Value  Intake Tab  Referral Department  Cardiology  Unit at Time of Referral  Cardiac/Telemetry Unit  Palliative Care Primary Diagnosis  Cardiac  Date Notified  08/11/16  Palliative Care Type  Return patient Palliative Care  Reason for referral  Clarify Goals of Care  Date of Admission  08/11/16  # of days IP prior to Palliative referral  0  Clinical Assessment  Psychosocial & Spiritual Assessment  Palliative Care Outcomes      Patient Active Problem List   Diagnosis Date Noted  . MRSA infection   . AKI (acute kidney injury) ([place])   . Malnutrition of moderate degree 08/13/2016  . Palliative care by specialist   . Failure to thrive (0-17) 08/11/2016  . Serratia marcescens infection ([place]) 07/31/2016  . Foreskin does not retract 07/31/2016  . Ulcer of urethral meatus 07/31/2016  . Hyperlipidemia 07/06/2016  . BPH (benign prostatic hyperplasia) 07/06/2016  . Acute back pain  06/24/2016  . Acute on chronic diastolic CHF (congestive heart failure) ([place]) 06/24/2016  . Debility 06/24/2016  . Leg weakness, bilateral 06/24/2016  . Pseudomonas urinary tract infection 06/24/2016  . Fall 06/24/2016  . Warfarin anticoagulation 06/24/2016  . Encounter for therapeutic drug monitoring 06/07/2016  . Pressure injury of skin 05/27/2016  . Acute deep vein thrombosis (DVT) of right upper extremity ([place]) 05/25/2016  . History of pacemaker 05/03/2016  . Anemia, iron deficiency 04/16/2016  . Endocarditis of tricuspid valve   . C. difficile colitis 04/10/2016  . HCAP (healthcare-associated pneumonia) 04/08/2016  . Chronic kidney disease, stage III (moderate) 03/09/2016  . Pressure ulcer 02/23/2016  . Seizure ([place]) 02/20/2016  . Palliative care encounter   . Goals of care, counseling/discussion   . Pacemaker infection ([place])   . Acute renal failure superimposed on stage 4 chronic kidney disease ([place]) 01/26/2016  . Bacteremia due to Staphylococcus aureus   . Pseudogout involving multiple joints 01/24/2016  . Bladder mass 01/24/2016  . Hemoptysis   . Pain   . Left upper extremity swelling   . Staphylococcus aureus bacteremia 01/17/2016  . CAP (community acquired pneumonia) 01/16/2016  . S/P hernia repair 10/28/2015  . Right inguinal hernia 09/09/2015  . GIB (gastrointestinal bleeding) 08/29/2015  . Hematuria 05/19/2015  . Thrombocytopenia ([place]) 11/11/2014  . Leukopenia 11/11/2014  . B12 deficiency 08/31/2013  . Pacemaker 08/26/2013  . On amiodarone therapy 08/21/2013  . Gout due to renal impairment, left ankle and foot 06/13/2013    Class: Acute  .  CAD of autologous bypass graft 06/05/2013  . MDS (myelodysplastic syndrome) ([place]) 05/22/2013  . Anemia of chronic disease 03/19/2013  . CKD (chronic kidney disease), stage IV ([place]) 03/18/2013  . NSVT (nonsustained ventricular tachycardia) ([place]) 03/18/2013  . Chronic diastolic heart failure ([place]) 03/17/2013  . Diabetes  mellitus ([place]) 03/17/2013  . Essential hypertension 03/17/2013  . Mesenteric artery stenosis ([place]) 05/01/2012  . PAF (paroxysmal atrial fibrillation) ([place]) 12/24/2011  . PAD (peripheral artery disease) ([place]) 12/24/2011  . Chronic vascular insufficiency of intestine ([facility]) 12/20/2011  . Personal history of DVT (deep vein thrombosis) 07/12/2009    Palliative Care Assessment & Plan       Assessment:  Multiple co-morbidities, overall failure to thrive, many re-hospitalizations, limited viable options to prolong quality of life, prognosis is poor Long term poor prognosis 2/2 to multiple co-morbidites.  Patient and family face the difficult decsions at EOL and the limitations of medcial intervetnions     Code Status:    Code Status Orders        Start     Ordered   08/13/16 1055  Do not attempt resuscitation (DNR)  Continuous    Question Answer Comment  In the event of cardiac or respiratory ARREST Do not call a "code blue"   In the event of cardiac or respiratory ARREST Do not perform Intubation, CPR, defibrillation or ACLS   In the event of cardiac or respiratory ARREST Use medication by any route, position, wound care, and other measures to relive pain and suffering. May use oxygen, suction and manual treatment of airway obstruction as needed for comfort.      08/13/16 1104    Code Status History    Date Active Date Inactive Code Status Order ID Comments User Context   08/11/2016  6:31 PM 08/13/2016 11:04 AM Full Code [id number]  Burtis Junes, NP Inpatient   08/11/2016  6:30 PM 08/11/2016  6:30 PM Full Code [id number]  Burtis Junes, NP Inpatient   06/24/2016 10:33 PM 07/01/2016  7:22 PM Full Code [id number]  Lily Kocher, MD Inpatient   05/25/2016  8:09 PM 06/03/2016  3:40 PM DNR [id number]  Murlean Iba, MD Inpatient   04/08/2016  6:22 PM 04/16/2016  6:10 PM Full Code [id number]  Donne Hazel, MD ED   02/20/2016  5:09 PM 02/23/2016  1:09 PM DNR [id number]  Rondel Jumbo,  PA-C ED   01/29/2016  5:34 PM 02/03/2016  6:31 PM DNR [id number]  Micheline Rough, MD Inpatient   01/27/2016  9:30 PM 01/29/2016  5:34 PM Full Code [id number]  Evans Lance, MD Inpatient   01/16/2016  5:14 PM 01/27/2016  9:30 PM Full Code [id number]  Bonnell Public, MD Inpatient   08/30/2015 12:33 AM 08/31/2015  5:14 PM Full Code [id number]  Edwin Dada, MD Inpatient   06/05/2013  4:19 AM 06/14/2013  5:41 PM Full Code [id number]  Charlton Haws, MD Inpatient   03/16/2013  9:54 PM 03/20/2013  6:18 PM Full Code [id number]  Theressa Millard, MD ED   01/06/2012 11:43 AM 01/09/2012  2:51 PM Full Code [id number]  Orvan July, RN Inpatient       Prognosis:   < 4 weeks  Discharge Planning:  To Be Determined  Care plan was discussed with nursing floor staff  Thank you for allowing the Palliative Medicine Team to assist in the care of this patient.   Time In: 1520 Time Out: 1550 Total Time 30 min Prolonged Time Billed  no       Greater than 50%  of this time was spent counseling and coordinating care related to the above assessment and plan.  Wadie Lessen, NP  Please contact Palliative Medicine Team phone at [phone] for questions and concerns.

## 2016-08-15 NOTE — Progress Notes (Signed)
Labs reviewed - there are >100,000 colonies of staph in the urine and 1/2 BCx now showing MRSA - suspect they are related. He is on IV vancomycin and zosyn for c.difficile colitis. Appreciate medicine care in this complicated patient. No changes to cardiac meds at this time.  Pixie Casino, MD, [facility] Attending Cardiologist [place]

## 2016-08-15 NOTE — Progress Notes (Signed)
Family voiced concerns about DNR status, Palliative care, PA on the unit asked if she could set in the room.

## 2016-08-15 NOTE — Progress Notes (Signed)
Dr Doyle Askew was just notified of patient's hgb being 6.8 this AM.

## 2016-08-15 NOTE — Progress Notes (Signed)
PHARMACY - PHYSICIAN COMMUNICATION CRITICAL VALUE ALERT - BLOOD CULTURE IDENTIFICATION (BCID)  Results for orders placed or performed during the hospital encounter of 08/11/16  Blood Culture ID Panel (Reflexed) (Collected: 08/14/2016  1:45 PM)  Result Value Ref Range   Enterococcus species NOT DETECTED NOT DETECTED   Listeria monocytogenes NOT DETECTED NOT DETECTED   Staphylococcus species DETECTED (A) NOT DETECTED   Staphylococcus aureus DETECTED (A) NOT DETECTED   Methicillin resistance DETECTED (A) NOT DETECTED   Streptococcus species NOT DETECTED NOT DETECTED   Streptococcus agalactiae NOT DETECTED NOT DETECTED   Streptococcus pneumoniae NOT DETECTED NOT DETECTED   Streptococcus pyogenes NOT DETECTED NOT DETECTED   Acinetobacter baumannii NOT DETECTED NOT DETECTED   Enterobacteriaceae species NOT DETECTED NOT DETECTED   Enterobacter cloacae complex NOT DETECTED NOT DETECTED   Escherichia coli NOT DETECTED NOT DETECTED   Klebsiella oxytoca NOT DETECTED NOT DETECTED   Klebsiella pneumoniae NOT DETECTED NOT DETECTED   Proteus species NOT DETECTED NOT DETECTED   Serratia marcescens NOT DETECTED NOT DETECTED   Haemophilus influenzae NOT DETECTED NOT DETECTED   Neisseria meningitidis NOT DETECTED NOT DETECTED   Pseudomonas aeruginosa NOT DETECTED NOT DETECTED   Candida albicans NOT DETECTED NOT DETECTED   Candida glabrata NOT DETECTED NOT DETECTED   Candida krusei NOT DETECTED NOT DETECTED   Candida parapsilosis NOT DETECTED NOT DETECTED   Candida tropicalis NOT DETECTED NOT DETECTED    Name of physician (or Provider) Contacted: [place]  Changes to prescribed antibiotics required: 2/2 Blood Cultures growing MRSA - adding Vanc IV. MD did not want to d/c Zosyn for now until could be seen later today.  Lawson Radar 08/15/2016  10:24 AM

## 2016-08-15 NOTE — Progress Notes (Signed)
Patient ID: [name] Brow., male   DOB: February 01, 1936, 81 y.o.   MRN: [id number]    PROGRESS NOTE    [name]  [id number] DOB: 1936-07-05 DOA: 08/11/2016  PCP: [name], MD   Brief Narrative:  81 y.o. male  significant for atrial fibrillation, coronary artery disease, CK D, diabetes, gastroparesis, GERD, gout, hypertension. Patient was originally admitted by cardiology on the 31st for weakness and fatigue thought to be due to congestive heart failure. Patient had a fever overnight 102. Patient is still very tired and weak per nursing. No other acute events overnight.  Assessment & Plan:   Active Problems: Sepsis secondary to MRSA bacteremia, Staph aureus UTI, C. Diff - started vancomycin, ID team consulted and assistance is appreciated  - zosyn stopped as no indication to continue and pt also with C. Diff - no plan to obtain TEE as pt with rather guarded prognosis  - plan to repeat blood cultures   Recurrent C diff - per ID, stop IV Flagyl and continue only P Vancomycin   Staph aureus UTI - vancomycin as noted above   Acute respiratory failure  Chronic diastolic CHF - with persistent LE edema  - stop IVF, lasix also held due to worsening Cr - Cr is overall trending down - this is the weight trend in the past 72 hours  Filed Weights   08/13/16 0504 08/14/16 0425 08/15/16 0635  Weight: 84.6 kg (186 lb 8 oz) 83.6 kg (184 lb 4.8 oz) 82.1 kg (181 lb)  - continue to monitor daily weights, strict I/O - continue Coreg, Imdur, Hydralazine  - appreciate cardiology team following   Hypotension - with SBP in 90's this AM - may need to hold some of the antihypertensive meds - pt currently on Norvasc, Imdur, Coreg, Hydralazine  - I will reduce hydralazine from Q6 hours to TID and also cute the dose down from 75 to 50 mg  - monitor BP closely   CAD - S/p CABG and redo CABG.  No chest pain.   - Trop mildly elevated w/ flat trend - Suspect demand ischemia  in the setting of diast CHF/generalized illness - no aspirin as pt already on Coumadin - no plan for further ischemic work up   PAF - Cont amio/? blocker/coumadin - in tele monitor   Right Upper Ext DVT - On coumadin - INR 2.97  Hypokalemia  - supplemented and WNL this AM  Hypomagnesemia - supplement and repeat Mg level in AM  Normocytic anemia - Hg drop from 7.4 --> 6.8 - will transfuse one unit PRBC today - CBC in AM  Hypothyroidism - TSH 32.676 on admission 1/31.  This was up from 22.371 on 12/15 - currently on synthroid 75 mcg QD   Acute renal failure superimposed on stage 4 chronic kidney disease ([place]) - holding on IVF as pt with more LE edema - no lasix either as Cr was trending up - currently Cr is trending down - BMP In AM   DVT prophylaxis: On Coumadin  Code Status: DNR Family Communication: Patient at bedside  Disposition Plan: To be determined   Consultants:   Cardiology  PCT  Procedures:     Antimicrobials:   Vancomycin 2/4 -->   Subjective: No events overnight.   Objective: Vitals:   08/15/16 0635 08/15/16 0801 08/15/16 1255 08/15/16 1307  BP: (!) 124/55  (!) 93/55   Pulse: 79 72 71 68  Resp: 18 16 18 18   Temp: 99.5  F (37.5 C)  98.4 F (36.9 C)   TempSrc: Axillary  Oral   SpO2: 96% 99% 98% 100%  Weight: 82.1 kg (181 lb)     Height:        Intake/Output Summary (Last 24 hours) at 08/15/16 1449 Last data filed at 08/15/16 1442  Gross per 24 hour  Intake              600 ml  Output                0 ml  Net              600 ml   Filed Weights   08/13/16 0504 08/14/16 0425 08/15/16 0635  Weight: 84.6 kg (186 lb 8 oz) 83.6 kg (184 lb 4.8 oz) 82.1 kg (181 lb)    Examination:  General exam: Appears lethargic but NAD Respiratory system: Respiratory effort normal, diminished breath sounds at bases  Cardiovascular system: RRR. No rubs, gallops or clicks. +2 bilateral LE edema Gastrointestinal system: Abdomen is  nondistended, soft and nontender. Central nervous system: lethargic   Data Reviewed: I have personally reviewed following labs and imaging studies  CBC:  Recent Labs Lab 08/11/16 1343 08/11/16 1850 08/14/16 1356 08/15/16 0439  WBC 12.8* 14.2* 20.6* 15.1*  NEUTROABS 10.5* 11.7*  --  13.6*  HGB 8.3* 8.1* 7.4* 6.8*  HCT 25.6* 25.1* 22.7* 21.2*  MCV 86.8 85.7 83.5 83.1  PLT 241 261 272 [id number]   Basic Metabolic Panel:  Recent Labs Lab 08/11/16 1850 08/12/16 0704 08/13/16 0448 08/14/16 1356 08/15/16 0439  NA 136 136 135 134* 134*  K 2.5* 2.5* 3.5 5.0 4.3  CL 100* 101 101 104 103  CO2 23 23 21* 20* 20*  GLUCOSE 171* 200* 200* 234* 174*  BUN 90* 91* 96* 102* 106*  CREATININE 3.27* 3.18* 3.26* 3.01* 2.93*  CALCIUM 8.0* 7.9* 8.0* 8.2* 8.0*  MG 1.3*  --   --  1.3* 1.3*  PHOS  --   --   --  2.6 3.3   Liver Function Tests:  Recent Labs Lab 08/11/16 1343 08/11/16 1850 08/14/16 1356  AST 46* 41 73*  ALT 17 15* 25  ALKPHOS 56 55 65  BILITOT 0.3 0.3 0.7  PROT 7.3 7.0 7.1  ALBUMIN 1.8* 1.8* 1.8*    Recent Labs Lab 08/11/16 1343  LIPASE 71*    Recent Labs Lab 08/11/16 1343  AMMONIA 13   Coagulation Profile:  Recent Labs Lab 08/11/16 1343 08/11/16 1850 08/12/16 0955 08/13/16 0448 08/14/16 1356  INR 2.57 2.15 2.91 2.92 2.97   Cardiac Enzymes:  Recent Labs Lab 08/11/16 1850 08/12/16 0120 08/12/16 0704  TROPONINI 0.07* 0.06* 0.06*   CBG:  Recent Labs Lab 08/14/16 1701 08/14/16 2216 08/15/16 1301  GLUCAP 239* 168* 180*   Thyroid Function Tests:  Recent Labs  08/14/16 1356  FREET4 0.70   Urine analysis:    Component Value Date/Time   COLORURINE YELLOW 08/11/2016 1556   APPEARANCEUR CLOUDY (A) 08/11/2016 1556   LABSPEC 1.010 08/11/2016 1556   LABSPEC 1.010 05/19/2015 1231   PHURINE 5.0 08/11/2016 1556   GLUCOSEU NEGATIVE 08/11/2016 1556   GLUCOSEU Negative 05/19/2015 1231   HGBUR MODERATE (A) 08/11/2016 1556   BILIRUBINUR NEGATIVE  08/11/2016 1556   BILIRUBINUR Negative 05/19/2015 1231   KETONESUR NEGATIVE 08/11/2016 1556   PROTEINUR 30 (A) 08/11/2016 1556   UROBILINOGEN 0.2 05/19/2015 1231   NITRITE NEGATIVE 08/11/2016 1556   LEUKOCYTESUR LARGE (A) 08/11/2016 1556  LEUKOCYTESUR Negative 05/19/2015 1231   Recent Results (from the past 240 hour(s))  MRSA PCR Screening     Status: Abnormal   Collection Time: 08/11/16  9:25 PM  Result Value Ref Range Status   MRSA by PCR POSITIVE (A) NEGATIVE Final    Comment:        The GeneXpert MRSA Assay (FDA approved for NASAL specimens only), is one component of a comprehensive MRSA colonization surveillance program. It is not intended to diagnose MRSA infection nor to guide or monitor treatment for MRSA infections. RESULT CALLED TO, READ BACK BY AND VERIFIED WITH: E.GARNETT,RN AT 2329 BY L.PITT 08/11/16   C difficile quick scan w PCR reflex     Status: Abnormal   Collection Time: 08/12/16  5:52 PM  Result Value Ref Range Status   C Diff antigen POSITIVE (A) NEGATIVE Final   C Diff toxin POSITIVE (A) NEGATIVE Final   C Diff interpretation Toxin producing C. difficile detected.  Final    Comment: CRITICAL RESULT CALLED TO, READ BACK BY AND VERIFIED WITH: Nilda Riggs RN [id number] 08/12/16 A BROWNING   Culture, Urine     Status: Abnormal (Preliminary result)   Collection Time: 08/14/16 12:23 PM  Result Value Ref Range Status   Specimen Description URINE, RANDOM  Final   Special Requests Oral vanc  Final   Culture (A)  Final    >=100,000 COLONIES/mL STAPHYLOCOCCUS AUREUS SUSCEPTIBILITIES TO FOLLOW    Report Status PENDING  Incomplete  Respiratory Panel by PCR     Status: None   Collection Time: 08/14/16 12:23 PM  Result Value Ref Range Status   Adenovirus NOT DETECTED NOT DETECTED Final   Coronavirus 229E NOT DETECTED NOT DETECTED Final   Coronavirus HKU1 NOT DETECTED NOT DETECTED Final   Coronavirus NL63 NOT DETECTED NOT DETECTED Final   Coronavirus OC43  NOT DETECTED NOT DETECTED Final   Metapneumovirus NOT DETECTED NOT DETECTED Final   Rhinovirus / Enterovirus NOT DETECTED NOT DETECTED Final   Influenza A NOT DETECTED NOT DETECTED Final   Influenza B NOT DETECTED NOT DETECTED Final   Parainfluenza Virus 1 NOT DETECTED NOT DETECTED Final   Parainfluenza Virus 2 NOT DETECTED NOT DETECTED Final   Parainfluenza Virus 3 NOT DETECTED NOT DETECTED Final   Parainfluenza Virus 4 NOT DETECTED NOT DETECTED Final   Respiratory Syncytial Virus NOT DETECTED NOT DETECTED Final   Bordetella pertussis NOT DETECTED NOT DETECTED Final   Chlamydophila pneumoniae NOT DETECTED NOT DETECTED Final   Mycoplasma pneumoniae NOT DETECTED NOT DETECTED Final  Culture, blood (Routine X 2) w Reflex to ID Panel     Status: None (Preliminary result)   Collection Time: 08/14/16  1:45 PM  Result Value Ref Range Status   Specimen Description BLOOD RIGHT ANTECUBITAL  Final   Special Requests BOTTLES DRAWN AEROBIC AND ANAEROBIC 5CC  Final   Culture  Setup Time   Final    GRAM POSITIVE COCCI IN CLUSTERS IN BOTH AEROBIC AND ANAEROBIC BOTTLES CRITICAL RESULT CALLED TO, READ BACK BY AND VERIFIED WITH: E MARTIN,PHARMD AT 1013 08/15/16 BY L BENFIELD    Culture GRAM POSITIVE COCCI  Final   Report Status PENDING  Incomplete  Blood Culture ID Panel (Reflexed)     Status: Abnormal   Collection Time: 08/14/16  1:45 PM  Result Value Ref Range Status   Enterococcus species NOT DETECTED NOT DETECTED Final   Listeria monocytogenes NOT DETECTED NOT DETECTED Final   Staphylococcus species DETECTED (A) NOT DETECTED  Final    Comment: CRITICAL RESULT CALLED TO, READ BACK BY AND VERIFIED WITH: E MARTIN,PHARMD AT 1013 08/15/16 BY L BENFIELD    Staphylococcus aureus DETECTED (A) NOT DETECTED Final    Comment: Methicillin (oxacillin)-resistant Staphylococcus aureus (MRSA). MRSA is predictably resistant to beta-lactam antibiotics (except ceftaroline). Preferred therapy is vancomycin unless  clinically contraindicated. Patient requires contact precautions if  hospitalized. CRITICAL RESULT CALLED TO, READ BACK BY AND VERIFIED WITH: E MARTIN,PHARMD AT 1013 08/15/16 BY L BENFIELD    Methicillin resistance DETECTED (A) NOT DETECTED Final    Comment: CRITICAL RESULT CALLED TO, READ BACK BY AND VERIFIED WITH: E MARTIN,PHARMD AT 1013 08/15/16 BY L BENFIELD    Streptococcus species NOT DETECTED NOT DETECTED Final   Streptococcus agalactiae NOT DETECTED NOT DETECTED Final   Streptococcus pneumoniae NOT DETECTED NOT DETECTED Final   Streptococcus pyogenes NOT DETECTED NOT DETECTED Final   Acinetobacter baumannii NOT DETECTED NOT DETECTED Final   Enterobacteriaceae species NOT DETECTED NOT DETECTED Final   Enterobacter cloacae complex NOT DETECTED NOT DETECTED Final   Escherichia coli NOT DETECTED NOT DETECTED Final   Klebsiella oxytoca NOT DETECTED NOT DETECTED Final   Klebsiella pneumoniae NOT DETECTED NOT DETECTED Final   Proteus species NOT DETECTED NOT DETECTED Final   Serratia marcescens NOT DETECTED NOT DETECTED Final   Haemophilus influenzae NOT DETECTED NOT DETECTED Final   Neisseria meningitidis NOT DETECTED NOT DETECTED Final   Pseudomonas aeruginosa NOT DETECTED NOT DETECTED Final   Candida albicans NOT DETECTED NOT DETECTED Final   Candida glabrata NOT DETECTED NOT DETECTED Final   Candida krusei NOT DETECTED NOT DETECTED Final   Candida parapsilosis NOT DETECTED NOT DETECTED Final   Candida tropicalis NOT DETECTED NOT DETECTED Final  Culture, blood (Routine X 2) w Reflex to ID Panel     Status: None (Preliminary result)   Collection Time: 08/14/16  1:55 PM  Result Value Ref Range Status   Specimen Description BLOOD BLOOD LEFT HAND  Final   Special Requests BOTTLES DRAWN AEROBIC AND ANAEROBIC 5CC  Final   Culture  Setup Time   Final    GRAM POSITIVE COCCI IN CLUSTERS ANAEROBIC BOTTLE ONLY CRITICAL RESULT CALLED TO, READ BACK BY AND VERIFIED WITH: E MARTIN,PHARMD AT  1013 08/15/16 BY L BENFIELD    Culture GRAM POSITIVE COCCI  Final   Report Status PENDING  Incomplete      Radiology Studies: Ct Abdomen Pelvis Wo Contrast  Result Date: 08/14/2016 CLINICAL DATA:  C. difficile colitis. Hx renal artery stenosis, peptic ulcer, ischemic colitis, internal hemorrhoids hiatal hernia, GERD, gastroparesis, diabetes, transurethral resection of prostate, partial gastrectomy, inguinal hernia repair and mesh insertion. EXAM: CT ABDOMEN AND PELVIS WITHOUT CONTRAST TECHNIQUE: Multidetector CT imaging of the abdomen and pelvis was performed following the standard protocol without IV contrast. COMPARISON:  06/26/2016 FINDINGS: Lower chest: There are bilateral pleural effusions. Bibasilar atelectasis is noted. Mild reticular changes identified along anterior right lower lobe. Status post median sternotomy. There is dense atherosclerotic calcification of the coronary vessels. Heart size is mildly enlarged. Hepatobiliary: Stable appearance of low-attenuation lesions in the liver, most likely representing cysts. Status post cholecystectomy. Pancreas: Poorly defined.  No acute abnormality. Spleen: Normal in size without focal abnormality. Adrenals/Urinary Tract: Adrenal glands are unremarkable. Kidneys are normal, without renal calculi, focal lesion, or hydronephrosis. Bladder is unremarkable. Stomach/Bowel: Stomach has a normal appearance. There is dilatation of small bowel loops. There is marked abnormality of the colon. The colonic wall is markedly thickened and  irregular throughout its entire course. The changes have progressed since the prior study. Contrast reaches the rectum at time of exam, excluding complete obstruction. Vascular/Lymphatic: Dense atherosclerotic calcification of the abdominal aorta and its branches. Reproductive: Prostatic calcifications are present. Other: There is diffuse body wall edema which has progressed since the prior study. Musculoskeletal: No acute or  significant osseous findings. IMPRESSION: 1. Interval progression of significant irregular thickening of the colonic wall consistent with the history of Celsius diff colitis. There is no evidence for perforation at this time. 2. Bilateral pleural effusions. 3. Status post cholecystectomy. 4. Small bowel dilatation possibly related to ileus. No evidence for obstruction. 5. Advanced atherosclerotic calcification of the abdominal aorta and its branches. Electronically Signed   By: Nolon Nations M.D.   On: 08/14/2016 21:56   Dg Chest Port 1 View  Result Date: 08/14/2016 CLINICAL DATA:  81 year old male with acute on chronic heart failure. Wheezing and lethargy. Initial encounter. EXAM: PORTABLE CHEST 1 VIEW COMPARISON:  08/12/2016 and earlier. FINDINGS: Portable AP semi upright view at 0924 hours. Mildly improved lung volumes and bibasilar ventilation. Stable cardiomegaly and mediastinal contours. Calcified aortic atherosclerosis. No pneumothorax or pulmonary edema. No definite pleural effusion or consolidation. Residual patchy bibasilar opacity most resembles atelectasis. Negative visible bowel gas pattern. IMPRESSION: Improved lung volumes with regressed bibasilar atelectasis. Stable cardiomegaly and Calcified aortic atherosclerosis. No pulmonary edema. Electronically Signed   By: Genevie Ann M.D.   On: 08/14/2016 09:36      Scheduled Meds: . amiodarone  100 mg Oral Daily  . amLODipine  5 mg Oral Daily  . carvedilol  25 mg Oral BID WC  . Chlorhexidine Gluconate Cloth  6 each Topical Q0600  . feeding supplement (ENSURE ENLIVE)  237 mL Oral BID BM  . feeding supplement (GLUCERNA SHAKE)  237 mL Oral BID BM  . hydrALAZINE  75 mg Oral Q6H  . insulin aspart  0-15 Units Subcutaneous TID WC  . ipratropium-albuterol  3 mL Nebulization TID  . isosorbide mononitrate  120 mg Oral Daily  . levothyroxine  50 mcg Oral QAC breakfast  . mupirocin ointment  1 application Nasal BID  . nutrition supplement (JUVEN)  1  packet Oral BID WC  . polyethylene glycol  17 g Oral Daily  . senna  1 tablet Oral BID  . sodium chloride flush  3 mL Intravenous Q12H  . tamsulosin  0.4 mg Oral QPC supper  . vancomycin  125 mg Oral QID  . [START ON 08/16/2016] vancomycin  1,000 mg Intravenous Q24H  . warfarin  4 mg Oral ONCE-1800  . Warfarin - Pharmacist Dosing Inpatient   Does not apply q1800   Continuous Infusions: . dextrose 5 % and 0.45 % NaCl with KCl 40 mEq/L Stopped (08/14/16 1910)     LOS: 4 days   Time spent: 20 minutes   Faye Ramsay, MD [facility] Pager [phone]  If 7PM-7AM, please contact night-coverage [email or web address] Password [email or web address] 08/15/2016, 2:49 PM

## 2016-08-15 NOTE — Consult Note (Signed)
[facility]ious [facility]  Date of Admission:  08/11/2016  Date of Consult:  08/15/2016  Reason for Consult: Staph/MRSA bacteremia Referring Physician: CHAMP  Impression/Recommendation MRSA  bacteremia Would continue vancomycin Would NOT get TEE Would repeat BCx.  His CXR 2-3 shows bibasilar atx. Will stop zosyn (will promote resolution of C diff).   C diff, recurrent Would stop IV flagyl. He is only having 2 -3 BM/day.   Continue po vanco.   UCx > 100k staph aureus Continue vanco  Palliative care He is doing poorly. I would not persue aggressive investigations at this point.   CHF He is +5L, wt is down 6kg from adm  Thank you so much for this interesting consult,   Bobby Rumpf (pager) [phone] [email or web address]  Zaccary Creech. is an 81 y.o. male.  HPI: 81 yo M with hx of DM2, CAD, CKD, recurrent C diff (last + 06-2016), previous TV endocarditis (04-2016- no positive BCx in EPIC at this time), who was adm on 1-31 for diuresis due to worsening anasarca.  He was noted on 2-1 to again have +/+ C diff. He was started on oral vanco. IV flagyl was added on 2-3.  On 2-2 he had temp 101.1 --> 102. His WBC increased to 20.6. He was started on zosyn due to concerns about his UA.  His BCx is noted to have MRSA 2/2 today, he was started on vancomycin.   During his stay he was evaluated by hospice, he did not qualify for their [place] hospice outpt services.   Past Medical History:  Diagnosis Date  . Angiomyolipoma 2009   On both kidneys noted in 2009  . Arthritis    neck and left wrist  . Atrial fibrillation ([place])   . CAD (coronary artery disease)    s/b CABG 1994, and subsequent stents. Repeat CABG 12/2011,  . Chronic diastolic heart failure ([place])   . CKD (chronic kidney disease)   . Clostridium difficile diarrhea 03/2016  . Depressive disorder   . Diabetes mellitus type 2 with peripheral artery disease (HCC)    DIET CONTROLLED  .  DVT (deep venous thrombosis) ([place]) 2011   Right arm  . Gastroparesis   . GERD (gastroesophageal reflux disease)   . Gout   . History of hiatal hernia   . Hyperlipidemia   . Hypertension   . Internal hemorrhoids without mention of complication   . Ischemic colitis ([place])   . 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 Myelodysplastic syndrome ([place]) 05/22/2013   With low hemoglobin and platelets treated with Procrit  . Osteopenia   . Peptic ulcer    S/p partial gastrectomy in 1969  . Peripheral artery disease ([place])   . Pneumonia 01/16/2016  . Pneumonia 03/2016  . Presence of permanent cardiac pacemaker   . Prostate cancer ([place]) 1997   XRT and lupron  . Renal artery stenosis ([place])   . Sick sinus syndrome ([place])   . Vitamin B 12 deficiency     Past Surgical History:  Procedure Laterality Date  . ABDOMINAL ANGIOGRAM N/A 05/25/2011   Procedure: ABDOMINAL ANGIOGRAM;  Surgeon: Serafina Mitchell, MD;  Location: [facility] CATH LAB;  Service: Cardiovascular;  Laterality: N/A;  . ABDOMINAL ANGIOGRAM N/A 02/13/2013   Procedure: ABDOMINAL ANGIOGRAM;  Surgeon: Serafina Mitchell, MD;  Location: [facility] CATH LAB;  Service: Cardiovascular;  Laterality: N/A;  . APPENDECTOMY  1991  . CELIAC ARTERY ANGIOPLASTY  05-16-12   and stenting  .  CHOLECYSTECTOMY  Oct 2009   Laparoscopic  . CORONARY ARTERY BYPASS GRAFT  01/22/1993  . CORONARY ARTERY BYPASS GRAFT  01/03/2012   Procedure: REDO CORONARY ARTERY BYPASS GRAFTING (CABG);  Surgeon: Gaye Pollack, MD;  Location: [place];  Service: Open Heart Surgery;  Laterality: N/A;  Redo CABG x  using bilateral internal mammary arteries;  left leg greater saphenous vein harvested endoscopically  . FOREIGN BODY REMOVAL ABDOMINAL Right 01/27/2016   Procedure: EXPOSURE OF RIGHT COMMON FEMORAL ARTERY AND REMOVAL FOREIGN;  Surgeon: Evans Lance, MD;  Location: [place];  Service: Cardiovascular;  Laterality: Right;  . HIATAL HERNIA REPAIR     and ulcer repair  . I&D EXTREMITY Left 01/21/2016   Procedure:  IRRIGATION AND DEBRIDEMENT EXTREMITY;  Surgeon: Milly Jakob, MD;  Location: [place];  Service: Orthopedics;  Laterality: Left;  . INGUINAL HERNIA REPAIR Right 10/28/2015   Procedure: OPEN RIGHT INGUINAL HERNIA REPAIR;  Surgeon: Greer Pickerel, MD;  Location: [facility];  Service: General;  Laterality: Right;  . INSERTION OF MESH Right 10/28/2015   Procedure: INSERTION OF MESH;  Surgeon: Greer Pickerel, MD;  Location: [facility];  Service: General;  Laterality: Right;  . IR GENERIC HISTORICAL  02/02/2016   IR FLUORO GUIDE CV LINE LEFT 02/02/2016 Markus Daft, MD [facility]  . IR GENERIC HISTORICAL  02/02/2016   IR US GUIDE VASC ACCESS LEFT 02/02/2016 Markus Daft, MD [facility]  . IR GENERIC HISTORICAL  03/31/2016   IR REMOVAL TUN CV CATH W/O FL 03/31/2016 Arne Cleveland, MD [facility]  . LEFT HEART CATHETERIZATION WITH CORONARY/GRAFT ANGIOGRAM  12/24/2011   Procedure: LEFT HEART CATHETERIZATION WITH Beatrix Fetters;  Surgeon: Sinclair Grooms, MD;  Location: [facility] CATH LAB;  Service: Cardiovascular;;  . LOWER EXTREMITY ANGIOGRAM Bilateral 05/25/2011   Procedure: LOWER EXTREMITY ANGIOGRAM;  Surgeon: Serafina Mitchell, MD;  Location: [facility] CATH LAB;  Service: Cardiovascular;  Laterality: Bilateral;  . OTHER SURGICAL HISTORY  02/13/13   superior mesenteric artery angiogram  . OTHER SURGICAL HISTORY  05/16/12   Stent in stomach  . PACEMAKER GENERATOR CHANGE  12/10/2003   SJM Identity XL DR performed by Dr Leonia Reeves  . PACEMAKER INSERTION  10/18/1994   DDD pacemaker, [employer]. Gen change 12/10/2003.  Marland Kitchen PACEMAKER LEAD REMOVAL Right 01/27/2016   Procedure: PACEMAKER EXTRACTION;  Surgeon: Evans Lance, MD;  Location: [place];  Service: Cardiovascular;  Laterality: Right;  DR. Roxy Manns TO BACKUP CASE  . PARTIAL GASTRECTOMY  1969   Hx of ulcer s/p partial gastrectomy/ has pernicious anemia  . RENAL ANGIOGRAM N/A 02/13/2013   Procedure: RENAL ANGIOGRAM;  Surgeon: Serafina Mitchell, MD;  Location: [facility] CATH LAB;  Service:  Cardiovascular;  Laterality: N/A;  . TEE WITHOUT CARDIOVERSION N/A 01/26/2016   Procedure: TRANSESOPHAGEAL ECHOCARDIOGRAM (TEE);  Surgeon: Sueanne Margarita, MD;  Location: [facility] ENDOSCOPY;  Service: Cardiovascular;  Laterality: N/A;  . TEE WITHOUT CARDIOVERSION N/A 01/27/2016   Procedure: TRANSESOPHAGEAL ECHOCARDIOGRAM (TEE);  Surgeon: Evans Lance, MD;  Location: [place];  Service: Cardiovascular;  Laterality: N/A;  . TEE WITHOUT CARDIOVERSION N/A 04/12/2016   Procedure: TRANSESOPHAGEAL ECHOCARDIOGRAM (TEE);  Surgeon: Dorothy Spark, MD;  Location: [place];  Service: Cardiovascular;  Laterality: N/A;  . THROMBECTOMY / EMBOLECTOMY SUBCLAVIAN ARTERY  02/02/10   Right subclavian thromboectomy and venous angioplasty, and chronic mesenteric ischemia with Herculink stenting to superior mesenteric and celiac arteries - Dr. Trula Slade  . TRANSURETHRAL RESECTION OF PROSTATE N/A 02/22/2016   Procedure: Kathrene Alu,  CLOT EVACUATION, FULGERATION;  Surgeon: Alexis Frock, MD;  Location[facility];  Service: Urology;  Laterality: N/A;  . VISCERAL ANGIOGRAM N/A 05/25/2011   Procedure: VISCERAL ANGIOGRAM;  Surgeon: Serafina Mitchell, MD;  Location: [facility] CATH LAB;  Service: Cardiovascular;  Laterality: N/A;  . VISCERAL ANGIOGRAM Bilateral 12/28/2011   Procedure: VISCERAL ANGIOGRAM;  Surgeon: Serafina Mitchell, MD;  Location: [facility] CATH LAB;  Service: Cardiovascular;  Laterality: Bilateral;  . VISCERAL ANGIOGRAM N/A 05/16/2012   Procedure: VISCERAL ANGIOGRAM;  Surgeon: Serafina Mitchell, MD;  Location: [facility] CATH LAB;  Service: Cardiovascular;  Laterality: N/A;  . VISCERAL ANGIOGRAM N/A 02/13/2013   Procedure: VISCERAL ANGIOGRAM;  Surgeon: Serafina Mitchell, MD;  Location: [facility] CATH LAB;  Service: Cardiovascular;  Laterality: N/A;     Allergies  Allergen Reactions  . Nsaids Nausea Only    Reaction:  GI upset   . Ibuprofen Other (See Comments)    Reaction:  GI upset   . Ace Inhibitors Cough    Medications:  Scheduled: . amiodarone  100  mg Oral Daily  . amLODipine  5 mg Oral Daily  . carvedilol  25 mg Oral BID WC  . Chlorhexidine Gluconate Cloth  6 each Topical Q0600  . feeding supplement (ENSURE ENLIVE)  237 mL Oral BID BM  . feeding supplement (GLUCERNA SHAKE)  237 mL Oral BID BM  . hydrALAZINE  75 mg Oral Q6H  . insulin aspart  0-15 Units Subcutaneous TID WC  . ipratropium-albuterol  3 mL Nebulization Q6H  . isosorbide mononitrate  120 mg Oral Daily  . levothyroxine  50 mcg Oral QAC breakfast  . metronidazole  500 mg Intravenous Q8H  . mupirocin ointment  1 application Nasal BID  . nutrition supplement (JUVEN)  1 packet Oral BID WC  . piperacillin-tazobactam (ZOSYN)  IV  3.375 g Intravenous Q8H  . polyethylene glycol  17 g Oral Daily  . senna  1 tablet Oral BID  . sodium chloride flush  3 mL Intravenous Q12H  . tamsulosin  0.4 mg Oral QPC supper  . vancomycin  1,500 mg Intravenous Once  . vancomycin  125 mg Oral QID  . [START ON 08/16/2016] vancomycin  1,000 mg Intravenous Q24H  . warfarin  4 mg Oral ONCE-1800  . Warfarin - Pharmacist Dosing Inpatient   Does not apply q1800    Abtx:  Anti-infectives    Start     Dose/Rate Route Frequency Ordered Stop   08/16/16 1300  vancomycin (VANCOCIN) IVPB 1000 mg/200 mL premix     1,000 mg 200 mL/hr over 60 Minutes Intravenous Every 24 hours 08/15/16 1108     08/15/16 1400  piperacillin-tazobactam (ZOSYN) IVPB 3.375 g     3.375 g 12.5 mL/hr over 240 Minutes Intravenous Every 8 hours 08/15/16 0724     08/15/16 1130  vancomycin (VANCOCIN) 1,500 mg in sodium chloride 0.9 % 500 mL IVPB     1,500 mg 250 mL/hr over 120 Minutes Intravenous  Once 08/15/16 1108     08/14/16 1200  metroNIDAZOLE (FLAGYL) IVPB 500 mg     500 mg 100 mL/hr over 60 Minutes Intravenous Every 8 hours 08/14/16 1114     08/14/16 1200  piperacillin-tazobactam (ZOSYN) IVPB 2.25 g  Status:  Discontinued     2.25 g 100 mL/hr over 30 Minutes Intravenous Every 6 hours 08/14/16 1115 08/15/16 0724   08/14/16  1115  piperacillin-tazobactam (ZOSYN) IVPB 3.375 g  Status:  Discontinued     3.375 g 12.5  mL/hr over 240 Minutes Intravenous Every 8 hours 08/14/16 1100 08/14/16 1115   08/13/16 1300  vancomycin (VANCOCIN) 50 mg/mL oral solution 125 mg     125 mg Oral 4 times daily 08/13/16 1138 2016/09/07 1359      Total days of antibiotics: 2  2/4 IV Vanc >> 2/3 Zosyn >> 2/3 Flagyl >> 2/2 PO Vanc >>          Social History:  reports that he quit smoking about 47 years ago. His smoking use included Cigarettes. He quit after 20.00 years of use. He has never used smokeless tobacco. He reports that he does not drink alcohol or use drugs.  Family History  Problem Relation Age of Onset  . Diabetes Mother   . Heart disease Mother     Heart Disease before age 60  . Hyperlipidemia Mother   . Hypertension Mother   . CVA Mother 24    cause of death  . Cancer Father     stomach/liver  . Hypertension Father     possibly hypertensive  . Cancer Sister     Breast cancer  . CAD Brother   . Heart disease Brother   . Cancer Paternal Uncle     colon  . Heart disease Sister   . Diabetes Sister   . Hypertension Sister   . Heart disease Daughter     Heart Disease before age 92  . Hypertension Daughter   . Heart attack Daughter     General ROS: + anorexia, +cough/sob, +loose Bm- per nsg 2 today, 2 yesterday, several last night post contrast.  f/c.Marland Kitchen Please see HPI. 12 point ROS o/w (-)  Blood pressure (!) 93/55, pulse 71, temperature 98.4 F (36.9 C), temperature source Oral, resp. rate 18, height _0  (1.778 m), weight 82.1 kg (181 lb), SpO2 98 %. General appearance: fatigued and no distress Eyes: EOMI Throat: normal moisture.  Neck: no adenopathy, supple, symmetrical, trachea midline and c/o pain with exam.  Lungs: diminished breath sounds bilaterally Heart: regular rate and rhythm Abdomen: normal findings: bowel sounds normal and soft mild tenderness.  Extremities: edema anasarca Skin: no  wounds on LE. LUE peripheral IV.    Results for orders placed or performed during the hospital encounter of 08/11/16 (from the past 48 hour(s))  Culture, Urine     Status: Abnormal (Preliminary result)   Collection Time: 08/14/16 12:23 PM  Result Value Ref Range   Specimen Description URINE, RANDOM    Special Requests Oral vanc    Culture (A)     >=100,000 COLONIES/mL STAPHYLOCOCCUS AUREUS SUSCEPTIBILITIES TO FOLLOW    Report Status PENDING   Respiratory Panel by PCR     Status: None   Collection Time: 08/14/16 12:23 PM  Result Value Ref Range   Adenovirus NOT DETECTED NOT DETECTED   Coronavirus 229E NOT DETECTED NOT DETECTED   Coronavirus HKU1 NOT DETECTED NOT DETECTED   Coronavirus NL63 NOT DETECTED NOT DETECTED   Coronavirus OC43 NOT DETECTED NOT DETECTED   Metapneumovirus NOT DETECTED NOT DETECTED   Rhinovirus / Enterovirus NOT DETECTED NOT DETECTED   Influenza A NOT DETECTED NOT DETECTED   Influenza B NOT DETECTED NOT DETECTED   Parainfluenza Virus 1 NOT DETECTED NOT DETECTED   Parainfluenza Virus 2 NOT DETECTED NOT DETECTED   Parainfluenza Virus 3 NOT DETECTED NOT DETECTED   Parainfluenza Virus 4 NOT DETECTED NOT DETECTED   Respiratory Syncytial Virus NOT DETECTED NOT DETECTED   Bordetella pertussis NOT DETECTED NOT DETECTED  Chlamydophila pneumoniae NOT DETECTED NOT DETECTED   Mycoplasma pneumoniae NOT DETECTED NOT DETECTED  Culture, blood (Routine X 2) w Reflex to ID Panel     Status: None (Preliminary result)   Collection Time: 08/14/16  1:45 PM  Result Value Ref Range   Specimen Description BLOOD RIGHT ANTECUBITAL    Special Requests BOTTLES DRAWN AEROBIC AND ANAEROBIC 5CC    Culture  Setup Time      GRAM POSITIVE COCCI IN CLUSTERS IN BOTH AEROBIC AND ANAEROBIC BOTTLES CRITICAL RESULT CALLED TO, READ BACK BY AND VERIFIED WITH: E MARTIN,PHARMD AT 1013 08/15/16 BY L BENFIELD    Culture GRAM POSITIVE COCCI    Report Status PENDING   Blood Culture ID Panel  (Reflexed)     Status: Abnormal   Collection Time: 08/14/16  1:45 PM  Result Value Ref Range   Enterococcus species NOT DETECTED NOT DETECTED   Listeria monocytogenes NOT DETECTED NOT DETECTED   Staphylococcus species DETECTED (A) NOT DETECTED    Comment: CRITICAL RESULT CALLED TO, READ BACK BY AND VERIFIED WITH: E MARTIN,PHARMD AT 1013 08/15/16 BY L BENFIELD    Staphylococcus aureus DETECTED (A) NOT DETECTED    Comment: Methicillin (oxacillin)-resistant Staphylococcus aureus (MRSA). MRSA is predictably resistant to beta-lactam antibiotics (except ceftaroline). Preferred therapy is vancomycin unless clinically contraindicated. Patient requires contact precautions if  hospitalized. CRITICAL RESULT CALLED TO, READ BACK BY AND VERIFIED WITH: E MARTIN,PHARMD AT 1013 08/15/16 BY L BENFIELD    Methicillin resistance DETECTED (A) NOT DETECTED    Comment: CRITICAL RESULT CALLED TO, READ BACK BY AND VERIFIED WITH: E MARTIN,PHARMD AT 1013 08/15/16 BY L BENFIELD    Streptococcus species NOT DETECTED NOT DETECTED   Streptococcus agalactiae NOT DETECTED NOT DETECTED   Streptococcus pneumoniae NOT DETECTED NOT DETECTED   Streptococcus pyogenes NOT DETECTED NOT DETECTED   Acinetobacter baumannii NOT DETECTED NOT DETECTED   Enterobacteriaceae species NOT DETECTED NOT DETECTED   Enterobacter cloacae complex NOT DETECTED NOT DETECTED   Escherichia coli NOT DETECTED NOT DETECTED   Klebsiella oxytoca NOT DETECTED NOT DETECTED   Klebsiella pneumoniae NOT DETECTED NOT DETECTED   Proteus species NOT DETECTED NOT DETECTED   Serratia marcescens NOT DETECTED NOT DETECTED   Haemophilus influenzae NOT DETECTED NOT DETECTED   Neisseria meningitidis NOT DETECTED NOT DETECTED   Pseudomonas aeruginosa NOT DETECTED NOT DETECTED   Candida albicans NOT DETECTED NOT DETECTED   Candida glabrata NOT DETECTED NOT DETECTED   Candida krusei NOT DETECTED NOT DETECTED   Candida parapsilosis NOT DETECTED NOT DETECTED   Candida  tropicalis NOT DETECTED NOT DETECTED  Culture, blood (Routine X 2) w Reflex to ID Panel     Status: None (Preliminary result)   Collection Time: 08/14/16  1:55 PM  Result Value Ref Range   Specimen Description BLOOD BLOOD LEFT HAND    Special Requests BOTTLES DRAWN AEROBIC AND ANAEROBIC 5CC    Culture  Setup Time      GRAM POSITIVE COCCI IN CLUSTERS ANAEROBIC BOTTLE ONLY CRITICAL RESULT CALLED TO, READ BACK BY AND VERIFIED WITH: E MARTIN,PHARMD AT 1013 08/15/16 BY L BENFIELD    Culture GRAM POSITIVE COCCI    Report Status PENDING   CBC     Status: Abnormal   Collection Time: 08/14/16  1:56 PM  Result Value Ref Range   WBC 20.6 (H) 4.0 - 10.5 K/uL   RBC 2.72 (L) 4.22 - 5.81 MIL/uL   Hemoglobin 7.4 (L) 13.0 - 17.0 g/dL   HCT 22.7 (L)  39.0 - 52.0 %   MCV 83.5 78.0 - 100.0 fL   MCH 27.2 26.0 - 34.0 pg   MCHC 32.6 30.0 - 36.0 g/dL   RDW 19.1 (H) 11.5 - 15.5 %   Platelets 272 150 - 400 K/uL  Comprehensive metabolic panel     Status: Abnormal   Collection Time: 08/14/16  1:56 PM  Result Value Ref Range   Sodium 134 (L) 135 - 145 mmol/L   Potassium 5.0 3.5 - 5.1 mmol/L    Comment: DELTA CHECK NOTED   Chloride 104 101 - 111 mmol/L   CO2 20 (L) 22 - 32 mmol/L   Glucose, Bld 234 (H) 65 - 99 mg/dL   BUN 102 (H) 6 - 20 mg/dL   Creatinine, Ser 3.01 (H) 0.61 - 1.24 mg/dL   Calcium 8.2 (L) 8.9 - 10.3 mg/dL   Total Protein 7.1 6.5 - 8.1 g/dL   Albumin 1.8 (L) 3.5 - 5.0 g/dL   AST 73 (H) 15 - 41 U/L   ALT 25 17 - 63 U/L   Alkaline Phosphatase 65 38 - 126 U/L   Total Bilirubin 0.7 0.3 - 1.2 mg/dL   GFR calc non Af Amer 18 (L) >60 mL/min   GFR calc Af Amer 21 (L) >60 mL/min    Comment: (NOTE) The eGFR has been calculated using the CKD EPI equation. This calculation has not been validated in all clinical situations. eGFR's persistently <60 mL/min signify possible Chronic Kidney Disease.    Anion gap 10 5 - 15  Brain natriuretic peptide     Status: Abnormal   Collection Time: 08/14/16   1:56 PM  Result Value Ref Range   B Natriuretic Peptide 665.3 (H) 0.0 - 100.0 pg/mL  Protime-INR     Status: Abnormal   Collection Time: 08/14/16  1:56 PM  Result Value Ref Range   Prothrombin Time 31.5 (H) 11.4 - 15.2 seconds   INR 2.97   Lactic acid, plasma     Status: None   Collection Time: 08/14/16  1:56 PM  Result Value Ref Range   Lactic Acid, Venous 1.8 0.5 - 1.9 mmol/L  T4, free     Status: None   Collection Time: 08/14/16  1:56 PM  Result Value Ref Range   Free T4 0.70 0.61 - 1.12 ng/dL    Comment: (NOTE) Biotin ingestion may interfere with free T4 tests. If the results are inconsistent with the TSH level, previous test results, or the clinical presentation, then consider biotin interference. If needed, order repeat testing after stopping biotin.   Magnesium     Status: Abnormal   Collection Time: 08/14/16  1:56 PM  Result Value Ref Range   Magnesium 1.3 (L) 1.7 - 2.4 mg/dL  Phosphorus     Status: None   Collection Time: 08/14/16  1:56 PM  Result Value Ref Range   Phosphorus 2.6 2.5 - 4.6 mg/dL  Glucose, capillary     Status: Abnormal   Collection Time: 08/14/16  5:01 PM  Result Value Ref Range   Glucose-Capillary 239 (H) 65 - 99 mg/dL  Glucose, capillary     Status: Abnormal   Collection Time: 08/14/16 10:16 PM  Result Value Ref Range   Glucose-Capillary 168 (H) 65 - 99 mg/dL  Magnesium     Status: Abnormal   Collection Time: 08/15/16  4:39 AM  Result Value Ref Range   Magnesium 1.3 (L) 1.7 - 2.4 mg/dL  Phosphorus     Status: None  Collection Time: 08/15/16  4:39 AM  Result Value Ref Range   Phosphorus 3.3 2.5 - 4.6 mg/dL  CBC with Differential/Platelet     Status: Abnormal   Collection Time: 08/15/16  4:39 AM  Result Value Ref Range   WBC 15.1 (H) 4.0 - 10.5 K/uL   RBC 2.55 (L) 4.22 - 5.81 MIL/uL   Hemoglobin 6.8 (LL) 13.0 - 17.0 g/dL    Comment: REPEATED TO VERIFY CRITICAL RESULT CALLED TO, READ BACK BY AND VERIFIED WITH: E.GARNETT,RN 3704 08/15/16  M.CAMPBELL    HCT 21.2 (L) 39.0 - 52.0 %   MCV 83.1 78.0 - 100.0 fL   MCH 26.7 26.0 - 34.0 pg   MCHC 32.1 30.0 - 36.0 g/dL   RDW 19.2 (H) 11.5 - 15.5 %   Platelets 235 150 - 400 K/uL   Neutrophils Relative % 90 %   Neutro Abs 13.6 (H) 1.7 - 7.7 K/uL   Lymphocytes Relative 3 %   Lymphs Abs 0.5 (L) 0.7 - 4.0 K/uL   Monocytes Relative 7 %   Monocytes Absolute 1.1 (H) 0.1 - 1.0 K/uL   Eosinophils Relative 0 %   Eosinophils Absolute 0.0 0.0 - 0.7 K/uL   Basophils Relative 0 %   Basophils Absolute 0.0 0.0 - 0.1 K/uL  Basic metabolic panel     Status: Abnormal   Collection Time: 08/15/16  4:39 AM  Result Value Ref Range   Sodium 134 (L) 135 - 145 mmol/L   Potassium 4.3 3.5 - 5.1 mmol/L   Chloride 103 101 - 111 mmol/L   CO2 20 (L) 22 - 32 mmol/L   Glucose, Bld 174 (H) 65 - 99 mg/dL   BUN 106 (H) 6 - 20 mg/dL   Creatinine, Ser 2.93 (H) 0.61 - 1.24 mg/dL   Calcium 8.0 (L) 8.9 - 10.3 mg/dL   GFR calc non Af Amer 19 (L) >60 mL/min   GFR calc Af Amer 22 (L) >60 mL/min    Comment: (NOTE) The eGFR has been calculated using the CKD EPI equation. This calculation has not been validated in all clinical situations. eGFR's persistently <60 mL/min signify possible Chronic Kidney Disease.    Anion gap 11 5 - 15  Glucose, capillary     Status: Abnormal   Collection Time: 08/15/16  1:01 PM  Result Value Ref Range   Glucose-Capillary 180 (H) 65 - 99 mg/dL      Component Value Date/Time   SDES BLOOD BLOOD LEFT HAND 08/14/2016 1355   SPECREQUEST BOTTLES DRAWN AEROBIC AND ANAEROBIC 5CC 08/14/2016 1355   CULT GRAM POSITIVE COCCI 08/14/2016 1355   REPTSTATUS PENDING 08/14/2016 1355   Ct Abdomen Pelvis Wo Contrast  Result Date: 08/14/2016 CLINICAL DATA:  C. difficile colitis. Hx renal artery stenosis, peptic ulcer, ischemic colitis, internal hemorrhoids hiatal hernia, GERD, gastroparesis, diabetes, transurethral resection of prostate, partial gastrectomy, inguinal hernia repair and mesh  insertion. EXAM: CT ABDOMEN AND PELVIS WITHOUT CONTRAST TECHNIQUE: Multidetector CT imaging of the abdomen and pelvis was performed following the standard protocol without IV contrast. COMPARISON:  06/26/2016 FINDINGS: Lower chest: There are bilateral pleural effusions. Bibasilar atelectasis is noted. Mild reticular changes identified along anterior right lower lobe. Status post median sternotomy. There is dense atherosclerotic calcification of the coronary vessels. Heart size is mildly enlarged. Hepatobiliary: Stable appearance of low-attenuation lesions in the liver, most likely representing cysts. Status post cholecystectomy. Pancreas: Poorly defined.  No acute abnormality. Spleen: Normal in size without focal abnormality. Adrenals/Urinary Tract: Adrenal glands are  unremarkable. Kidneys are normal, without renal calculi, focal lesion, or hydronephrosis. Bladder is unremarkable. Stomach/Bowel: Stomach has a normal appearance. There is dilatation of small bowel loops. There is marked abnormality of the colon. The colonic wall is markedly thickened and irregular throughout its entire course. The changes have progressed since the prior study. Contrast reaches the rectum at time of exam, excluding complete obstruction. Vascular/Lymphatic: Dense atherosclerotic calcification of the abdominal aorta and its branches. Reproductive: Prostatic calcifications are present. Other: There is diffuse body wall edema which has progressed since the prior study. Musculoskeletal: No acute or significant osseous findings. IMPRESSION: 1. Interval progression of significant irregular thickening of the colonic wall consistent with the history of Celsius diff colitis. There is no evidence for perforation at this time. 2. Bilateral pleural effusions. 3. Status post cholecystectomy. 4. Small bowel dilatation possibly related to ileus. No evidence for obstruction. 5. Advanced atherosclerotic calcification of the abdominal aorta and its  branches. Electronically Signed   By: Nolon Nations M.D.   On: 08/14/2016 21:56   Dg Chest Port 1 View  Result Date: 08/14/2016 CLINICAL DATA:  81 year old male with acute on chronic heart failure. Wheezing and lethargy. Initial encounter. EXAM: PORTABLE CHEST 1 VIEW COMPARISON:  08/12/2016 and earlier. FINDINGS: Portable AP semi upright view at 0924 hours. Mildly improved lung volumes and bibasilar ventilation. Stable cardiomegaly and mediastinal contours. Calcified aortic atherosclerosis. No pneumothorax or pulmonary edema. No definite pleural effusion or consolidation. Residual patchy bibasilar opacity most resembles atelectasis. Negative visible bowel gas pattern. IMPRESSION: Improved lung volumes with regressed bibasilar atelectasis. Stable cardiomegaly and Calcified aortic atherosclerosis. No pulmonary edema. Electronically Signed   By: Genevie Ann M.D.   On: 08/14/2016 09:36   Recent Results (from the past 240 hour(s))  MRSA PCR Screening     Status: Abnormal   Collection Time: 08/11/16  9:25 PM  Result Value Ref Range Status   MRSA by PCR POSITIVE (A) NEGATIVE Final    Comment:        The GeneXpert MRSA Assay (FDA approved for NASAL specimens only), is one component of a comprehensive MRSA colonization surveillance program. It is not intended to diagnose MRSA infection nor to guide or monitor treatment for MRSA infections. RESULT CALLED TO, READ BACK BY AND VERIFIED WITH: E.GARNETT,RN AT 2329 BY L.PITT 08/11/16   C difficile quick scan w PCR reflex     Status: Abnormal   Collection Time: 08/12/16  5:52 PM  Result Value Ref Range Status   C Diff antigen POSITIVE (A) NEGATIVE Final   C Diff toxin POSITIVE (A) NEGATIVE Final   C Diff interpretation Toxin producing C. difficile detected.  Final    Comment: CRITICAL RESULT CALLED TO, READ BACK BY AND VERIFIED WITH: Nilda Riggs RN 1601 08/12/16 A BROWNING   Culture, Urine     Status: Abnormal (Preliminary result)   Collection  Time: 08/14/16 12:23 PM  Result Value Ref Range Status   Specimen Description URINE, RANDOM  Final   Special Requests Oral vanc  Final   Culture (A)  Final    >=100,000 COLONIES/mL STAPHYLOCOCCUS AUREUS SUSCEPTIBILITIES TO FOLLOW    Report Status PENDING  Incomplete  Respiratory Panel by PCR     Status: None   Collection Time: 08/14/16 12:23 PM  Result Value Ref Range Status   Adenovirus NOT DETECTED NOT DETECTED Final   Coronavirus 229E NOT DETECTED NOT DETECTED Final   Coronavirus HKU1 NOT DETECTED NOT DETECTED Final   Coronavirus NL63 NOT DETECTED  NOT DETECTED Final   Coronavirus OC43 NOT DETECTED NOT DETECTED Final   Metapneumovirus NOT DETECTED NOT DETECTED Final   Rhinovirus / Enterovirus NOT DETECTED NOT DETECTED Final   Influenza A NOT DETECTED NOT DETECTED Final   Influenza B NOT DETECTED NOT DETECTED Final   Parainfluenza Virus 1 NOT DETECTED NOT DETECTED Final   Parainfluenza Virus 2 NOT DETECTED NOT DETECTED Final   Parainfluenza Virus 3 NOT DETECTED NOT DETECTED Final   Parainfluenza Virus 4 NOT DETECTED NOT DETECTED Final   Respiratory Syncytial Virus NOT DETECTED NOT DETECTED Final   Bordetella pertussis NOT DETECTED NOT DETECTED Final   Chlamydophila pneumoniae NOT DETECTED NOT DETECTED Final   Mycoplasma pneumoniae NOT DETECTED NOT DETECTED Final  Culture, blood (Routine X 2) w Reflex to ID Panel     Status: None (Preliminary result)   Collection Time: 08/14/16  1:45 PM  Result Value Ref Range Status   Specimen Description BLOOD RIGHT ANTECUBITAL  Final   Special Requests BOTTLES DRAWN AEROBIC AND ANAEROBIC 5CC  Final   Culture  Setup Time   Final    GRAM POSITIVE COCCI IN CLUSTERS IN BOTH AEROBIC AND ANAEROBIC BOTTLES CRITICAL RESULT CALLED TO, READ BACK BY AND VERIFIED WITH: E MARTIN,PHARMD AT 1013 08/15/16 BY L BENFIELD    Culture GRAM POSITIVE COCCI  Final   Report Status PENDING  Incomplete  Blood Culture ID Panel (Reflexed)     Status: Abnormal    Collection Time: 08/14/16  1:45 PM  Result Value Ref Range Status   Enterococcus species NOT DETECTED NOT DETECTED Final   Listeria monocytogenes NOT DETECTED NOT DETECTED Final   Staphylococcus species DETECTED (A) NOT DETECTED Final    Comment: CRITICAL RESULT CALLED TO, READ BACK BY AND VERIFIED WITH: E MARTIN,PHARMD AT 1013 08/15/16 BY L BENFIELD    Staphylococcus aureus DETECTED (A) NOT DETECTED Final    Comment: Methicillin (oxacillin)-resistant Staphylococcus aureus (MRSA). MRSA is predictably resistant to beta-lactam antibiotics (except ceftaroline). Preferred therapy is vancomycin unless clinically contraindicated. Patient requires contact precautions if  hospitalized. CRITICAL RESULT CALLED TO, READ BACK BY AND VERIFIED WITH: E MARTIN,PHARMD AT 1013 08/15/16 BY L BENFIELD    Methicillin resistance DETECTED (A) NOT DETECTED Final    Comment: CRITICAL RESULT CALLED TO, READ BACK BY AND VERIFIED WITH: E MARTIN,PHARMD AT 1013 08/15/16 BY L BENFIELD    Streptococcus species NOT DETECTED NOT DETECTED Final   Streptococcus agalactiae NOT DETECTED NOT DETECTED Final   Streptococcus pneumoniae NOT DETECTED NOT DETECTED Final   Streptococcus pyogenes NOT DETECTED NOT DETECTED Final   Acinetobacter baumannii NOT DETECTED NOT DETECTED Final   Enterobacteriaceae species NOT DETECTED NOT DETECTED Final   Enterobacter cloacae complex NOT DETECTED NOT DETECTED Final   Escherichia coli NOT DETECTED NOT DETECTED Final   Klebsiella oxytoca NOT DETECTED NOT DETECTED Final   Klebsiella pneumoniae NOT DETECTED NOT DETECTED Final   Proteus species NOT DETECTED NOT DETECTED Final   Serratia marcescens NOT DETECTED NOT DETECTED Final   Haemophilus influenzae NOT DETECTED NOT DETECTED Final   Neisseria meningitidis NOT DETECTED NOT DETECTED Final   Pseudomonas aeruginosa NOT DETECTED NOT DETECTED Final   Candida albicans NOT DETECTED NOT DETECTED Final   Candida glabrata NOT DETECTED NOT DETECTED Final     Candida krusei NOT DETECTED NOT DETECTED Final   Candida parapsilosis NOT DETECTED NOT DETECTED Final   Candida tropicalis NOT DETECTED NOT DETECTED Final  Culture, blood (Routine X 2) w Reflex to ID Panel  Status: None (Preliminary result)   Collection Time: 08/14/16  1:55 PM  Result Value Ref Range Status   Specimen Description BLOOD BLOOD LEFT HAND  Final   Special Requests BOTTLES DRAWN AEROBIC AND ANAEROBIC 5CC  Final   Culture  Setup Time   Final    GRAM POSITIVE COCCI IN CLUSTERS ANAEROBIC BOTTLE ONLY CRITICAL RESULT CALLED TO, READ BACK BY AND VERIFIED WITH: E MARTIN,PHARMD AT 1013 08/15/16 BY L BENFIELD    Culture GRAM POSITIVE COCCI  Final   Report Status PENDING  Incomplete      08/15/2016, 1:06 PM     LOS: 4 days    Records and images were personally reviewed where available.

## 2016-08-16 ENCOUNTER — Inpatient Hospital Stay (HOSPITAL_COMMUNITY): Payer: Medicare Other

## 2016-08-16 DIAGNOSIS — R7881 Bacteremia: Secondary | ICD-10-CM

## 2016-08-16 DIAGNOSIS — Z66 Do not resuscitate: Secondary | ICD-10-CM

## 2016-08-16 LAB — BASIC METABOLIC PANEL
Anion gap: 12 (ref 5–15)
BUN: 109 mg/dL — ABNORMAL HIGH (ref 6–20)
CO2: 19 mmol/L — ABNORMAL LOW (ref 22–32)
CREATININE: 3.29 mg/dL — AB (ref 0.61–1.24)
Calcium: 8.1 mg/dL — ABNORMAL LOW (ref 8.9–10.3)
Chloride: 104 mmol/L (ref 101–111)
GFR, EST AFRICAN AMERICAN: 19 mL/min — AB (ref >60)
GFR, EST NON AFRICAN AMERICAN: 17 mL/min — AB (ref >60)
Glucose, Bld: 133 mg/dL — ABNORMAL HIGH (ref 65–99)
POTASSIUM: 4.1 mmol/L (ref 3.5–5.1)
SODIUM: 135 mmol/L (ref 135–145)

## 2016-08-16 LAB — URINE CULTURE

## 2016-08-16 LAB — ECHOCARDIOGRAM COMPLETE
HEIGHTINCHES: 70 in
Weight: 3041.6 oz

## 2016-08-16 LAB — TYPE AND SCREEN
Blood Product Expiration Date: 201803022359
ISSUE DATE / TIME: 201802041831
Unit Type and Rh: 5100

## 2016-08-16 LAB — CBC
HEMATOCRIT: 24.8 % — AB (ref 39.0–52.0)
Hemoglobin: 8.2 g/dL — ABNORMAL LOW (ref 13.0–17.0)
MCH: 27.4 pg (ref 26.0–34.0)
MCHC: 33.1 g/dL (ref 30.0–36.0)
MCV: 82.9 fL (ref 78.0–100.0)
Platelets: 233 10*3/uL (ref 150–400)
RBC: 2.99 MIL/uL — AB (ref 4.22–5.81)
RDW: 18.6 % — ABNORMAL HIGH (ref 11.5–15.5)
WBC: 21.7 10*3/uL — AB (ref 4.0–10.5)

## 2016-08-16 LAB — PROTIME-INR
INR: 5.25 — AB
Prothrombin Time: 49.7 seconds — ABNORMAL HIGH (ref 11.4–15.2)

## 2016-08-16 LAB — GLUCOSE, CAPILLARY
GLUCOSE-CAPILLARY: 131 mg/dL — AB (ref 65–99)
GLUCOSE-CAPILLARY: 129 mg/dL — AB (ref 65–99)
Glucose-Capillary: 152 mg/dL — ABNORMAL HIGH (ref 65–99)
Glucose-Capillary: 146 mg/dL — ABNORMAL HIGH (ref 65–99)

## 2016-08-16 LAB — MAGNESIUM: Magnesium: 1.7 mg/dL (ref 1.7–2.4)

## 2016-08-16 MED ORDER — VANCOMYCIN HCL IN DEXTROSE 1-5 GM/200ML-% IV SOLN
1000.0000 mg | INTRAVENOUS | Status: DC
  Administered 2016-08-17: 1000 mg via INTRAVENOUS
  Filled 2016-08-16: qty 200

## 2016-08-16 MED ORDER — IPRATROPIUM-ALBUTEROL 0.5-2.5 (3) MG/3ML IN SOLN: 3.0000 mL | Freq: Four times a day (QID) | RESPIRATORY_TRACT | Status: DC | PRN

## 2016-08-16 NOTE — Progress Notes (Addendum)
Patient ID: [name] Brow., male   DOB: May 07, 1936, 81 y.o.   MRN: [id number]    PROGRESS NOTE    [name]  [id number] DOB: [birth year] DOA: 08/11/2016  PCP: [name], MD   Brief Narrative:  81 y.o. male  significant for atrial fibrillation, coronary artery disease, CK D, diabetes, gastroparesis, GERD, gout, hypertension. Patient was originally admitted by cardiology on the 31st for weakness and fatigue thought to be due to congestive heart failure. Patient had a fever overnight 102. Patient is still very tired and weak per nursing. No other acute events overnight.  Assessment & Plan:   Active Problems: Sepsis secondary to MRSA bacteremia, Staph aureus UTI, C. Diff - started vancomycin, ID team consulted and assistance is appreciated  - zosyn stopped as no indication to continue and pt also with C. Diff - no plan to obtain TEE as pt with rather guarded prognosis  - repeat blood cultures done 2/4, so far no growth  - if can not exclude endocarditis, will need 4-6 weeks course of IV Vancomycin  - worrisome persistent rise in WBC, monitor closely   Recurrent C diff - per ID, stop IV Flagyl and continue only PO Vancomycin, will need two weeks therapy   Staph aureus UTI - vancomycin as noted above   Acute respiratory failure   - respiratory status stable this AM   Chronic diastolic CHF - with persistent LE edema  - stopped IVF 2/4, lasix also held due to worsening Cr, up from 2.93 --> 3.29 this AM  - this is the weight trend in the past 72 hours  Filed Weights   08/14/16 0425 08/15/16 0635 08/16/16 0549  Weight: 83.6 kg (184 lb 4.8 oz) 82.1 kg (181 lb) 86.2 kg (190 lb 1.6 oz)  - continue to monitor daily weights, strict I/O - continue Coreg, Imdur, Hydralazine  - appreciate cardiology team following   Hypotension - with SBP in 90's this AM - may need to hold some of the antihypertensive meds - pt currently on Norvasc, Imdur, Coreg, Hydralazine    - I already reduced hydralazine from Q6 hours to TID and also cut the dose down from 75 to 50 mg, this was done 2/4 - monitor BP closely   CAD - S/p CABG and redo CABG.  No chest pain.   - Trop mildly elevated w/ flat trend - Suspect demand ischemia in the setting of diast CHF/generalized illness - no aspirin as pt already on Coumadin - no plan for further ischemic work up   PAF - Cont amio/? blocker/coumadin - in tele monitor   Right Upper Ext DVT - On coumadin - INR 5.25  Hypokalemia  - supplemented and WNL this AM  Hypomagnesemia - supplemented and WNL this AM   Normocytic anemia - Hg drop from 7.4 --> 6.8 -->  8.2 - s/p 1 U PRBC transfusion 2/4 - CBC in AM  Hypothyroidism - TSH 32.676 on admission 1/31.  This was up from 22.371 on 12/15 - currently on synthroid 75 mcg QD   Acute renal failure superimposed on stage 4 chronic kidney disease ([place]) - holding on IVF as pt with more LE edema - no lasix either as Cr continues trending up - also worrisome for giving Vancomycin, will discuss with ID team if we can change ABX if Cr continues trending up  - BMP In AM  Moderate PCM - appreciate nutritionist recommendations   Decubitus ulcer, sacral area, also bilateral heals -  [place] team consulted   DVT prophylaxis: On Coumadin  Code Status: DNR Family Communication: Patient at bedside  Disposition Plan: To be determined   Consultants:   Cardiology  PCT  Procedures:   None  Antimicrobials:   Vancomycin IV 2/4 -->   Vancomycin PO  Subjective: No events overnight.   Objective: Vitals:   08/16/16 0549 08/16/16 0842 08/16/16 0945 08/16/16 1219  BP: (!) 110/52 (!) 92/55  (!) 98/53  Pulse: 83 75  76  Resp: 18 18  18   Temp: 99.5 F (37.5 C) 99.3 F (37.4 C)  99 F (37.2 C)  TempSrc: Oral Oral  Oral  SpO2: 100% 100% 100% 100%  Weight: 86.2 kg (190 lb 1.6 oz)     Height:        Intake/Output Summary (Last 24 hours) at 08/16/16 1313 Last data  filed at 08/16/16 1008  Gross per 24 hour  Intake             1036 ml  Output                0 ml  Net             1036 ml   Filed Weights   08/14/16 0425 08/15/16 0635 08/16/16 0549  Weight: 83.6 kg (184 lb 4.8 oz) 82.1 kg (181 lb) 86.2 kg (190 lb 1.6 oz)    Examination:  General exam: Appears more alert, NAD Respiratory system: Respiratory effort normal, diminished breath sounds at bases  Cardiovascular system: RRR. No rubs, gallops or clicks. +2 bilateral LE edema Gastrointestinal system: Abdomen is nondistended, soft and nontender. Central nervous system: lethargic   Data Reviewed: I have personally reviewed following labs and imaging studies  CBC:  Recent Labs Lab 08/11/16 1343 08/11/16 1850 08/14/16 1356 08/15/16 0439 08/16/16 0322  WBC 12.8* 14.2* 20.6* 15.1* 21.7*  NEUTROABS 10.5* 11.7*  --  13.6*  --   HGB 8.3* 8.1* 7.4* 6.8* 8.2*  HCT 25.6* 25.1* 22.7* 21.2* 24.8*  MCV 86.8 85.7 83.5 83.1 82.9  PLT 241 261 272 235 0000000   Basic Metabolic Panel:  Recent Labs Lab 08/11/16 1850 08/12/16 0704 08/13/16 0448 08/14/16 1356 08/15/16 0439 08/16/16 0322  NA 136 136 135 134* 134* 135  K 2.5* 2.5* 3.5 5.0 4.3 4.1  CL 100* 101 101 104 103 104  CO2 23 23 21* 20* 20* 19*  GLUCOSE 171* 200* 200* 234* 174* 133*  BUN 90* 91* 96* 102* 106* 109*  CREATININE 3.27* 3.18* 3.26* 3.01* 2.93* 3.29*  CALCIUM 8.0* 7.9* 8.0* 8.2* 8.0* 8.1*  MG 1.3*  --   --  1.3* 1.3* 1.7  PHOS  --   --   --  2.6 3.3  --    Liver Function Tests:  Recent Labs Lab 08/11/16 1343 08/11/16 1850 08/14/16 1356  AST 46* 41 73*  ALT 17 15* 25  ALKPHOS 56 55 65  BILITOT 0.3 0.3 0.7  PROT 7.3 7.0 7.1  ALBUMIN 1.8* 1.8* 1.8*    Recent Labs Lab 08/11/16 1343  LIPASE 71*    Recent Labs Lab 08/11/16 1343  AMMONIA 13   Coagulation Profile:  Recent Labs Lab 08/11/16 1850 08/12/16 0955 08/13/16 0448 08/14/16 1356 08/16/16 0322  INR 2.15 2.91 2.92 2.97 5.25*   Cardiac  Enzymes:  Recent Labs Lab 08/11/16 1850 08/12/16 0120 08/12/16 0704  TROPONINI 0.07* 0.06* 0.06*   CBG:  Recent Labs Lab 08/15/16 1301 08/15/16 1636 08/15/16 2305 08/16/16 0750 08/16/16 1217  GLUCAP 180* 160* 128* 131* 129*   Thyroid Function Tests:  Recent Labs  08/14/16 1356  FREET4 0.70   Urine analysis:    Component Value Date/Time   COLORURINE YELLOW 08/11/2016 1556   APPEARANCEUR CLOUDY (A) 08/11/2016 1556   LABSPEC 1.010 08/11/2016 1556   LABSPEC 1.010 05/19/2015 1231   PHURINE 5.0 08/11/2016 1556   GLUCOSEU NEGATIVE 08/11/2016 1556   GLUCOSEU Negative 05/19/2015 1231   HGBUR MODERATE (A) 08/11/2016 1556   BILIRUBINUR NEGATIVE 08/11/2016 1556   BILIRUBINUR Negative 05/19/2015 1231   KETONESUR NEGATIVE 08/11/2016 1556   PROTEINUR 30 (A) 08/11/2016 1556   UROBILINOGEN 0.2 05/19/2015 1231   NITRITE NEGATIVE 08/11/2016 1556   LEUKOCYTESUR LARGE (A) 08/11/2016 1556   LEUKOCYTESUR Negative 05/19/2015 1231   Recent Results (from the past 240 hour(s))  MRSA PCR Screening     Status: Abnormal   Collection Time: 08/11/16  9:25 PM  Result Value Ref Range Status   MRSA by PCR POSITIVE (A) NEGATIVE Final    Comment:        The GeneXpert MRSA Assay (FDA approved for NASAL specimens only), is one component of a comprehensive MRSA colonization surveillance program. It is not intended to diagnose MRSA infection nor to guide or monitor treatment for MRSA infections. RESULT CALLED TO, READ BACK BY AND VERIFIED WITH: E.GARNETT,RN AT 2329 BY L.PITT 08/11/16   C difficile quick scan w PCR reflex     Status: Abnormal   Collection Time: 08/12/16  5:52 PM  Result Value Ref Range Status   C Diff antigen POSITIVE (A) NEGATIVE Final   C Diff toxin POSITIVE (A) NEGATIVE Final   C Diff interpretation Toxin producing C. difficile detected.  Final    Comment: CRITICAL RESULT CALLED TO, READ BACK BY AND VERIFIED WITH: Nilda Riggs RN [id number] 08/12/16 A BROWNING    Culture, Urine     Status: Abnormal   Collection Time: 08/14/16 12:23 PM  Result Value Ref Range Status   Specimen Description URINE, RANDOM  Final   Special Requests Oral vanc  Final   Culture (A)  Final    >=100,000 COLONIES/mL METHICILLIN RESISTANT STAPHYLOCOCCUS AUREUS   Report Status 08/16/2016 FINAL  Final   Organism ID, Bacteria METHICILLIN RESISTANT STAPHYLOCOCCUS AUREUS (A)  Final      Susceptibility   Methicillin resistant staphylococcus aureus - MIC*    CIPROFLOXACIN >=8 RESISTANT Resistant     GENTAMICIN <=0.5 SENSITIVE Sensitive     NITROFURANTOIN <=16 SENSITIVE Sensitive     OXACILLIN >=4 RESISTANT Resistant     TETRACYCLINE <=1 SENSITIVE Sensitive     VANCOMYCIN 1 SENSITIVE Sensitive     TRIMETH/SULFA >=320 RESISTANT Resistant     CLINDAMYCIN <=0.25 SENSITIVE Sensitive     RIFAMPIN <=0.5 SENSITIVE Sensitive     Inducible Clindamycin NEGATIVE Sensitive     * >=100,000 COLONIES/mL METHICILLIN RESISTANT STAPHYLOCOCCUS AUREUS  Respiratory Panel by PCR     Status: None   Collection Time: 08/14/16 12:23 PM  Result Value Ref Range Status   Adenovirus NOT DETECTED NOT DETECTED Final   Coronavirus 229E NOT DETECTED NOT DETECTED Final   Coronavirus HKU1 NOT DETECTED NOT DETECTED Final   Coronavirus NL63 NOT DETECTED NOT DETECTED Final   Coronavirus OC43 NOT DETECTED NOT DETECTED Final   Metapneumovirus NOT DETECTED NOT DETECTED Final   Rhinovirus / Enterovirus NOT DETECTED NOT DETECTED Final   Influenza A NOT DETECTED NOT DETECTED Final   Influenza B NOT DETECTED NOT DETECTED Final  Parainfluenza Virus 1 NOT DETECTED NOT DETECTED Final   Parainfluenza Virus 2 NOT DETECTED NOT DETECTED Final   Parainfluenza Virus 3 NOT DETECTED NOT DETECTED Final   Parainfluenza Virus 4 NOT DETECTED NOT DETECTED Final   Respiratory Syncytial Virus NOT DETECTED NOT DETECTED Final   Bordetella pertussis NOT DETECTED NOT DETECTED Final   Chlamydophila pneumoniae NOT DETECTED NOT DETECTED  Final   Mycoplasma pneumoniae NOT DETECTED NOT DETECTED Final  Culture, blood (Routine X 2) w Reflex to ID Panel     Status: Abnormal (Preliminary result)   Collection Time: 08/14/16  1:45 PM  Result Value Ref Range Status   Specimen Description BLOOD RIGHT ANTECUBITAL  Final   Special Requests BOTTLES DRAWN AEROBIC AND ANAEROBIC 5CC  Final   Culture  Setup Time   Final    GRAM POSITIVE COCCI IN CLUSTERS IN BOTH AEROBIC AND ANAEROBIC BOTTLES CRITICAL RESULT CALLED TO, READ BACK BY AND VERIFIED WITH: E MARTIN,PHARMD AT 1013 08/15/16 BY L BENFIELD    Culture (A)  Final    STAPHYLOCOCCUS AUREUS SUSCEPTIBILITIES TO FOLLOW    Report Status PENDING  Incomplete  Blood Culture ID Panel (Reflexed)     Status: Abnormal   Collection Time: 08/14/16  1:45 PM  Result Value Ref Range Status   Enterococcus species NOT DETECTED NOT DETECTED Final   Listeria monocytogenes NOT DETECTED NOT DETECTED Final   Staphylococcus species DETECTED (A) NOT DETECTED Final    Comment: CRITICAL RESULT CALLED TO, READ BACK BY AND VERIFIED WITH: E MARTIN,PHARMD AT 1013 08/15/16 BY L BENFIELD    Staphylococcus aureus DETECTED (A) NOT DETECTED Final    Comment: Methicillin (oxacillin)-resistant Staphylococcus aureus (MRSA). MRSA is predictably resistant to beta-lactam antibiotics (except ceftaroline). Preferred therapy is vancomycin unless clinically contraindicated. Patient requires contact precautions if  hospitalized. CRITICAL RESULT CALLED TO, READ BACK BY AND VERIFIED WITH: E MARTIN,PHARMD AT 1013 08/15/16 BY L BENFIELD    Methicillin resistance DETECTED (A) NOT DETECTED Final    Comment: CRITICAL RESULT CALLED TO, READ BACK BY AND VERIFIED WITH: E MARTIN,PHARMD AT 1013 08/15/16 BY L BENFIELD    Streptococcus species NOT DETECTED NOT DETECTED Final   Streptococcus agalactiae NOT DETECTED NOT DETECTED Final   Streptococcus pneumoniae NOT DETECTED NOT DETECTED Final   Streptococcus pyogenes NOT DETECTED NOT DETECTED  Final   Acinetobacter baumannii NOT DETECTED NOT DETECTED Final   Enterobacteriaceae species NOT DETECTED NOT DETECTED Final   Enterobacter cloacae complex NOT DETECTED NOT DETECTED Final   Escherichia coli NOT DETECTED NOT DETECTED Final   Klebsiella oxytoca NOT DETECTED NOT DETECTED Final   Klebsiella pneumoniae NOT DETECTED NOT DETECTED Final   Proteus species NOT DETECTED NOT DETECTED Final   Serratia marcescens NOT DETECTED NOT DETECTED Final   Haemophilus influenzae NOT DETECTED NOT DETECTED Final   Neisseria meningitidis NOT DETECTED NOT DETECTED Final   Pseudomonas aeruginosa NOT DETECTED NOT DETECTED Final   Candida albicans NOT DETECTED NOT DETECTED Final   Candida glabrata NOT DETECTED NOT DETECTED Final   Candida krusei NOT DETECTED NOT DETECTED Final   Candida parapsilosis NOT DETECTED NOT DETECTED Final   Candida tropicalis NOT DETECTED NOT DETECTED Final  Culture, blood (Routine X 2) w Reflex to ID Panel     Status: Abnormal (Preliminary result)   Collection Time: 08/14/16  1:55 PM  Result Value Ref Range Status   Specimen Description BLOOD BLOOD LEFT HAND  Final   Special Requests BOTTLES DRAWN AEROBIC AND ANAEROBIC 5CC  Final  Culture  Setup Time   Final    GRAM POSITIVE COCCI IN CLUSTERS IN BOTH AEROBIC AND ANAEROBIC BOTTLES CRITICAL RESULT CALLED TO, READ BACK BY AND VERIFIED WITH: E MARTIN,PHARMD AT 1013 08/15/16 BY L BENFIELD    Culture STAPHYLOCOCCUS AUREUS (A)  Final   Report Status PENDING  Incomplete      Radiology Studies: Ct Abdomen Pelvis Wo Contrast  Result Date: 08/14/2016 CLINICAL DATA:  C. difficile colitis. Hx renal artery stenosis, peptic ulcer, ischemic colitis, internal hemorrhoids hiatal hernia, GERD, gastroparesis, diabetes, transurethral resection of prostate, partial gastrectomy, inguinal hernia repair and mesh insertion. EXAM: CT ABDOMEN AND PELVIS WITHOUT CONTRAST TECHNIQUE: Multidetector CT imaging of the abdomen and pelvis was performed  following the standard protocol without IV contrast. COMPARISON:  06/26/2016 FINDINGS: Lower chest: There are bilateral pleural effusions. Bibasilar atelectasis is noted. Mild reticular changes identified along anterior right lower lobe. Status post median sternotomy. There is dense atherosclerotic calcification of the coronary vessels. Heart size is mildly enlarged. Hepatobiliary: Stable appearance of low-attenuation lesions in the liver, most likely representing cysts. Status post cholecystectomy. Pancreas: Poorly defined.  No acute abnormality. Spleen: Normal in size without focal abnormality. Adrenals/Urinary Tract: Adrenal glands are unremarkable. Kidneys are normal, without renal calculi, focal lesion, or hydronephrosis. Bladder is unremarkable. Stomach/Bowel: Stomach has a normal appearance. There is dilatation of small bowel loops. There is marked abnormality of the colon. The colonic wall is markedly thickened and irregular throughout its entire course. The changes have progressed since the prior study. Contrast reaches the rectum at time of exam, excluding complete obstruction. Vascular/Lymphatic: Dense atherosclerotic calcification of the abdominal aorta and its branches. Reproductive: Prostatic calcifications are present. Other: There is diffuse body wall edema which has progressed since the prior study. Musculoskeletal: No acute or significant osseous findings. IMPRESSION: 1. Interval progression of significant irregular thickening of the colonic wall consistent with the history of Celsius diff colitis. There is no evidence for perforation at this time. 2. Bilateral pleural effusions. 3. Status post cholecystectomy. 4. Small bowel dilatation possibly related to ileus. No evidence for obstruction. 5. Advanced atherosclerotic calcification of the abdominal aorta and its branches. Electronically Signed   By: Nolon Nations M.D.   On: 08/14/2016 21:56      Scheduled Meds: . amiodarone  100 mg Oral  Daily  . amLODipine  5 mg Oral Daily  . carvedilol  25 mg Oral BID WC  . feeding supplement (ENSURE ENLIVE)  237 mL Oral BID BM  . hydrALAZINE  50 mg Oral Q8H  . insulin aspart  0-15 Units Subcutaneous TID WC  . isosorbide mononitrate  120 mg Oral Daily  . levothyroxine  50 mcg Oral QAC breakfast  . mupirocin ointment  1 application Nasal BID  . nutrition supplement (JUVEN)  1 packet Oral BID WC  . polyethylene glycol  17 g Oral Daily  . senna  1 tablet Oral BID  . sodium chloride flush  3 mL Intravenous Q12H  . tamsulosin  0.4 mg Oral QPC supper  . vancomycin  125 mg Oral QID  . [START ON 08/17/2016] vancomycin  1,000 mg Intravenous Q48H  . Warfarin - Pharmacist Dosing Inpatient   Does not apply q1800   Continuous Infusions:    LOS: 5 days   Time spent: 20 minutes   Faye Ramsay, MD [facility] Pager [phone]  If 7PM-7AM, please contact night-coverage [email or web address] Password [email or web address] 08/16/2016, 1:13 PM

## 2016-08-16 NOTE — Progress Notes (Signed)
ANTICOAGULATION CONSULT NOTE - Follow Up Consult  Pharmacy Consult for Coumadin Indication: afib and hx of DVT  Allergies  Allergen Reactions  . Nsaids Nausea Only    Reaction:  GI upset   . Ibuprofen Other (See Comments)    Reaction:  GI upset   . Ace Inhibitors Cough    Patient Measurements: Height: 5\' 10"  (177.8 cm) Weight: 190 lb 1.6 oz (86.2 kg) IBW/kg (Calculated) : 73   Vital Signs: Temp: 99.3 F (37.4 C) (02/05 0842) Temp Source: Oral (02/05 0842) BP: 92/55 (02/05 0842) Pulse Rate: 75 (02/05 0842)  Labs:  Recent Labs  08/14/16 1356 08/15/16 0439 08/16/16 0322  HGB 7.4* 6.8* 8.2*  HCT 22.7* 21.2* 24.8*  PLT 272 235 233  LABPROT 31.5*  --  49.7*  INR 2.97  --  5.25*  CREATININE 3.01* 2.93* 3.29*    Estimated Creatinine Clearance: 19.1 mL/min (by C-G formula based on SCr of 3.29 mg/dL (H)).  Assessment: 80yom continues on coumadin for afib and hx DVT.  INR elevated today at 5.25, most likely due to Flagyl interaction No bleeding noted  PTA dose: 5mg  daily except 6mg  on Sun  Goal of Therapy:  INR 2-3 Monitor platelets by anticoagulation protocol: Yes   Plan:  No Coumadin today Daily INR  Thank you Anette Guarneri, PharmD [phone] 08/16/16 8:51 AM

## 2016-08-16 NOTE — Progress Notes (Signed)
Subjective: No new complaints   Antibiotics:  Anti-infectives    Start     Dose/Rate Route Frequency Ordered Stop   08/17/16 1130  vancomycin (VANCOCIN) IVPB 1000 mg/200 mL premix     1,000 mg 200 mL/hr over 60 Minutes Intravenous Every 48 hours 08/16/16 0848     08/16/16 1300  vancomycin (VANCOCIN) IVPB 1000 mg/200 mL premix  Status:  Discontinued     1,000 mg 200 mL/hr over 60 Minutes Intravenous Every 24 hours 08/15/16 1108 08/16/16 0848   08/15/16 1400  piperacillin-tazobactam (ZOSYN) IVPB 3.375 g  Status:  Discontinued     3.375 g 12.5 mL/hr over 240 Minutes Intravenous Every 8 hours 08/15/16 0724 08/15/16 1342   08/15/16 1130  vancomycin (VANCOCIN) 1,500 mg in sodium chloride 0.9 % 500 mL IVPB     1,500 mg 250 mL/hr over 120 Minutes Intravenous  Once 08/15/16 1108 08/15/16 1330   08/14/16 1200  metroNIDAZOLE (FLAGYL) IVPB 500 mg  Status:  Discontinued     500 mg 100 mL/hr over 60 Minutes Intravenous Every 8 hours 08/14/16 1114 08/15/16 1342   08/14/16 1200  piperacillin-tazobactam (ZOSYN) IVPB 2.25 g  Status:  Discontinued     2.25 g 100 mL/hr over 30 Minutes Intravenous Every 6 hours 08/14/16 1115 08/15/16 0724   08/14/16 1115  piperacillin-tazobactam (ZOSYN) IVPB 3.375 g  Status:  Discontinued     3.375 g 12.5 mL/hr over 240 Minutes Intravenous Every 8 hours 08/14/16 1100 08/14/16 1115   08/13/16 1300  vancomycin (VANCOCIN) 50 mg/mL oral solution 125 mg     125 mg Oral 4 times daily 08/13/16 1138 09-16-16 1359      Medications: Scheduled Meds: . amiodarone  100 mg Oral Daily  . amLODipine  5 mg Oral Daily  . carvedilol  25 mg Oral BID WC  . feeding supplement (ENSURE ENLIVE)  237 mL Oral BID BM  . feeding supplement (GLUCERNA SHAKE)  237 mL Oral BID BM  . hydrALAZINE  50 mg Oral Q8H  . insulin aspart  0-15 Units Subcutaneous TID WC  . ipratropium-albuterol  3 mL Nebulization TID  . isosorbide mononitrate  120 mg Oral Daily  . levothyroxine  50 mcg  Oral QAC breakfast  . mupirocin ointment  1 application Nasal BID  . nutrition supplement (JUVEN)  1 packet Oral BID WC  . polyethylene glycol  17 g Oral Daily  . senna  1 tablet Oral BID  . sodium chloride flush  3 mL Intravenous Q12H  . tamsulosin  0.4 mg Oral QPC supper  . vancomycin  125 mg Oral QID  . [START ON 08/17/2016] vancomycin  1,000 mg Intravenous Q48H  . Warfarin - Pharmacist Dosing Inpatient   Does not apply q1800   Continuous Infusions: PRN Meds:.sodium chloride, acetaminophen, albuterol, HYDROmorphone (DILAUDID) injection, metoprolol, ondansetron (ZOFRAN) IV, sodium chloride flush    Objective: Weight change: 9 lb 1.6 oz (4.129 kg)  Intake/Output Summary (Last 24 hours) at 08/16/16 1124 Last data filed at 08/16/16 1008  Gross per 24 hour  Intake             1036 ml  Output                0 ml  Net             1036 ml   Blood pressure (!) 92/55, pulse 75, temperature 99.3 F (37.4 C), temperature source Oral, resp. rate 18, height 5'  10" (1.778 m), weight 190 lb 1.6 oz (86.2 kg), SpO2 100 %. Temp:  [97.7 F (36.5 C)-99.5 F (37.5 C)] 99.3 F (37.4 C) (02/05 0842) Pulse Rate:  [68-83] 75 (02/05 0842) Resp:  [16-18] 18 (02/05 0842) BP: (92-128)/(48-55) 92/55 (02/05 0842) SpO2:  [98 %-100 %] 100 % (02/05 0945) Weight:  [190 lb 1.6 oz (86.2 kg)] 190 lb 1.6 oz (86.2 kg) (02/05 0549)  Physical Exam: General: Alert and awake, oriented x3, not in any acute distress. HEENT: anicteric sclera, pupils reactive to light and accommodation, EOMI CVS regular rate, normal r,  no murmur rubs or gallops Chest: clear to auscultation bilaterally, no wheezing, rales or rhonchi Abdomen: soft distended, dec bs, tender diffusely Skin: no rashes Lymph: no new lymphadenopathy Neuro: nonfocal   CBC: CBC Latest Ref Rng & Units 08/16/2016 08/15/2016 08/14/2016  WBC 4.0 - 10.5 K/uL 21.7(H) 15.1(H) 20.6(H)  Hemoglobin 13.0 - 17.0 g/dL 8.2(L) 6.8(LL) 7.4(L)  Hematocrit 39.0 - 52.0 %  24.8(L) 21.2(L) 22.7(L)  Platelets 150 - 400 K/uL 233 235 272     BMET  Recent Labs  08/15/16 0439 08/16/16 0322  NA 134* 135  K 4.3 4.1  CL 103 104  CO2 20* 19*  GLUCOSE 174* 133*  BUN 106* 109*  CREATININE 2.93* 3.29*  CALCIUM 8.0* 8.1*     Liver Panel   Recent Labs  08/14/16 1356  PROT 7.1  ALBUMIN 1.8*  AST 73*  ALT 25  ALKPHOS 65  BILITOT 0.7       Sedimentation Rate No results for input(s): ESRSEDRATE in the last 72 hours. C-Reactive Protein No results for input(s): CRP in the last 72 hours.  Micro Results: Recent Results (from the past 720 hour(s))  MRSA PCR Screening     Status: Abnormal   Collection Time: 08/11/16  9:25 PM  Result Value Ref Range Status   MRSA by PCR POSITIVE (A) NEGATIVE Final    Comment:        The GeneXpert MRSA Assay (FDA approved for NASAL specimens only), is one component of a comprehensive MRSA colonization surveillance program. It is not intended to diagnose MRSA infection nor to guide or monitor treatment for MRSA infections. RESULT CALLED TO, READ BACK BY AND VERIFIED WITH: E.GARNETT,RN AT 2329 BY L.PITT 08/11/16   C difficile quick scan w PCR reflex     Status: Abnormal   Collection Time: 08/12/16  5:52 PM  Result Value Ref Range Status   C Diff antigen POSITIVE (A) NEGATIVE Final   C Diff toxin POSITIVE (A) NEGATIVE Final   C Diff interpretation Toxin producing C. difficile detected.  Final    Comment: CRITICAL RESULT CALLED TO, READ BACK BY AND VERIFIED WITH: Nilda Riggs RN [id number] 08/12/16 A BROWNING   Culture, Urine     Status: Abnormal   Collection Time: 08/14/16 12:23 PM  Result Value Ref Range Status   Specimen Description URINE, RANDOM  Final   Special Requests Oral vanc  Final   Culture (A)  Final    >=100,000 COLONIES/mL METHICILLIN RESISTANT STAPHYLOCOCCUS AUREUS   Report Status 08/16/2016 FINAL  Final   Organism ID, Bacteria METHICILLIN RESISTANT STAPHYLOCOCCUS AUREUS (A)  Final       Susceptibility   Methicillin resistant staphylococcus aureus - MIC*    CIPROFLOXACIN >=8 RESISTANT Resistant     GENTAMICIN <=0.5 SENSITIVE Sensitive     NITROFURANTOIN <=16 SENSITIVE Sensitive     OXACILLIN >=4 RESISTANT Resistant     TETRACYCLINE <=1  SENSITIVE Sensitive     VANCOMYCIN 1 SENSITIVE Sensitive     TRIMETH/SULFA >=320 RESISTANT Resistant     CLINDAMYCIN <=0.25 SENSITIVE Sensitive     RIFAMPIN <=0.5 SENSITIVE Sensitive     Inducible Clindamycin NEGATIVE Sensitive     * >=100,000 COLONIES/mL METHICILLIN RESISTANT STAPHYLOCOCCUS AUREUS  Respiratory Panel by PCR     Status: None   Collection Time: 08/14/16 12:23 PM  Result Value Ref Range Status   Adenovirus NOT DETECTED NOT DETECTED Final   Coronavirus 229E NOT DETECTED NOT DETECTED Final   Coronavirus HKU1 NOT DETECTED NOT DETECTED Final   Coronavirus NL63 NOT DETECTED NOT DETECTED Final   Coronavirus OC43 NOT DETECTED NOT DETECTED Final   Metapneumovirus NOT DETECTED NOT DETECTED Final   Rhinovirus / Enterovirus NOT DETECTED NOT DETECTED Final   Influenza A NOT DETECTED NOT DETECTED Final   Influenza B NOT DETECTED NOT DETECTED Final   Parainfluenza Virus 1 NOT DETECTED NOT DETECTED Final   Parainfluenza Virus 2 NOT DETECTED NOT DETECTED Final   Parainfluenza Virus 3 NOT DETECTED NOT DETECTED Final   Parainfluenza Virus 4 NOT DETECTED NOT DETECTED Final   Respiratory Syncytial Virus NOT DETECTED NOT DETECTED Final   Bordetella pertussis NOT DETECTED NOT DETECTED Final   Chlamydophila pneumoniae NOT DETECTED NOT DETECTED Final   Mycoplasma pneumoniae NOT DETECTED NOT DETECTED Final  Culture, blood (Routine X 2) w Reflex to ID Panel     Status: Abnormal (Preliminary result)   Collection Time: 08/14/16  1:45 PM  Result Value Ref Range Status   Specimen Description BLOOD RIGHT ANTECUBITAL  Final   Special Requests BOTTLES DRAWN AEROBIC AND ANAEROBIC 5CC  Final   Culture  Setup Time   Final    GRAM POSITIVE COCCI IN  CLUSTERS IN BOTH AEROBIC AND ANAEROBIC BOTTLES CRITICAL RESULT CALLED TO, READ BACK BY AND VERIFIED WITH: E MARTIN,PHARMD AT 1013 08/15/16 BY L BENFIELD    Culture (A)  Final    STAPHYLOCOCCUS AUREUS SUSCEPTIBILITIES TO FOLLOW    Report Status PENDING  Incomplete  Blood Culture ID Panel (Reflexed)     Status: Abnormal   Collection Time: 08/14/16  1:45 PM  Result Value Ref Range Status   Enterococcus species NOT DETECTED NOT DETECTED Final   Listeria monocytogenes NOT DETECTED NOT DETECTED Final   Staphylococcus species DETECTED (A) NOT DETECTED Final    Comment: CRITICAL RESULT CALLED TO, READ BACK BY AND VERIFIED WITH: E MARTIN,PHARMD AT 1013 08/15/16 BY L BENFIELD    Staphylococcus aureus DETECTED (A) NOT DETECTED Final    Comment: Methicillin (oxacillin)-resistant Staphylococcus aureus (MRSA). MRSA is predictably resistant to beta-lactam antibiotics (except ceftaroline). Preferred therapy is vancomycin unless clinically contraindicated. Patient requires contact precautions if  hospitalized. CRITICAL RESULT CALLED TO, READ BACK BY AND VERIFIED WITH: E MARTIN,PHARMD AT 1013 08/15/16 BY L BENFIELD    Methicillin resistance DETECTED (A) NOT DETECTED Final    Comment: CRITICAL RESULT CALLED TO, READ BACK BY AND VERIFIED WITH: E MARTIN,PHARMD AT 1013 08/15/16 BY L BENFIELD    Streptococcus species NOT DETECTED NOT DETECTED Final   Streptococcus agalactiae NOT DETECTED NOT DETECTED Final   Streptococcus pneumoniae NOT DETECTED NOT DETECTED Final   Streptococcus pyogenes NOT DETECTED NOT DETECTED Final   Acinetobacter baumannii NOT DETECTED NOT DETECTED Final   Enterobacteriaceae species NOT DETECTED NOT DETECTED Final   Enterobacter cloacae complex NOT DETECTED NOT DETECTED Final   Escherichia coli NOT DETECTED NOT DETECTED Final   Klebsiella oxytoca NOT DETECTED  NOT DETECTED Final   Klebsiella pneumoniae NOT DETECTED NOT DETECTED Final   Proteus species NOT DETECTED NOT DETECTED Final    Serratia marcescens NOT DETECTED NOT DETECTED Final   Haemophilus influenzae NOT DETECTED NOT DETECTED Final   Neisseria meningitidis NOT DETECTED NOT DETECTED Final   Pseudomonas aeruginosa NOT DETECTED NOT DETECTED Final   Candida albicans NOT DETECTED NOT DETECTED Final   Candida glabrata NOT DETECTED NOT DETECTED Final   Candida krusei NOT DETECTED NOT DETECTED Final   Candida parapsilosis NOT DETECTED NOT DETECTED Final   Candida tropicalis NOT DETECTED NOT DETECTED Final  Culture, blood (Routine X 2) w Reflex to ID Panel     Status: Abnormal (Preliminary result)   Collection Time: 08/14/16  1:55 PM  Result Value Ref Range Status   Specimen Description BLOOD BLOOD LEFT HAND  Final   Special Requests BOTTLES DRAWN AEROBIC AND ANAEROBIC 5CC  Final   Culture  Setup Time   Final    GRAM POSITIVE COCCI IN CLUSTERS IN BOTH AEROBIC AND ANAEROBIC BOTTLES CRITICAL RESULT CALLED TO, READ BACK BY AND VERIFIED WITH: E MARTIN,PHARMD AT 1013 08/15/16 BY L BENFIELD    Culture STAPHYLOCOCCUS AUREUS (A)  Final   Report Status PENDING  Incomplete    Studies/Results: Ct Abdomen Pelvis Wo Contrast  Result Date: 08/14/2016 CLINICAL DATA:  C. difficile colitis. Hx renal artery stenosis, peptic ulcer, ischemic colitis, internal hemorrhoids hiatal hernia, GERD, gastroparesis, diabetes, transurethral resection of prostate, partial gastrectomy, inguinal hernia repair and mesh insertion. EXAM: CT ABDOMEN AND PELVIS WITHOUT CONTRAST TECHNIQUE: Multidetector CT imaging of the abdomen and pelvis was performed following the standard protocol without IV contrast. COMPARISON:  06/26/2016 FINDINGS: Lower chest: There are bilateral pleural effusions. Bibasilar atelectasis is noted. Mild reticular changes identified along anterior right lower lobe. Status post median sternotomy. There is dense atherosclerotic calcification of the coronary vessels. Heart size is mildly enlarged. Hepatobiliary: Stable appearance of  low-attenuation lesions in the liver, most likely representing cysts. Status post cholecystectomy. Pancreas: Poorly defined.  No acute abnormality. Spleen: Normal in size without focal abnormality. Adrenals/Urinary Tract: Adrenal glands are unremarkable. Kidneys are normal, without renal calculi, focal lesion, or hydronephrosis. Bladder is unremarkable. Stomach/Bowel: Stomach has a normal appearance. There is dilatation of small bowel loops. There is marked abnormality of the colon. The colonic wall is markedly thickened and irregular throughout its entire course. The changes have progressed since the prior study. Contrast reaches the rectum at time of exam, excluding complete obstruction. Vascular/Lymphatic: Dense atherosclerotic calcification of the abdominal aorta and its branches. Reproductive: Prostatic calcifications are present. Other: There is diffuse body wall edema which has progressed since the prior study. Musculoskeletal: No acute or significant osseous findings. IMPRESSION: 1. Interval progression of significant irregular thickening of the colonic wall consistent with the history of Celsius diff colitis. There is no evidence for perforation at this time. 2. Bilateral pleural effusions. 3. Status post cholecystectomy. 4. Small bowel dilatation possibly related to ileus. No evidence for obstruction. 5. Advanced atherosclerotic calcification of the abdominal aorta and its branches. Electronically Signed   By: Nolon Nations M.D.   On: 08/14/2016 21:56      Assessment/Plan:  INTERVAL HISTORY: repeat blood culture yesterday one site no growth   Active Problems:   Diabetes mellitus (HCC)   CKD (chronic kidney disease), stage IV (HCC)   Anemia of chronic disease   CAD of autologous bypass graft   Acute renal failure superimposed on stage 4 chronic kidney disease (  [place])   HCAP (healthcare-associated pneumonia)   C. difficile colitis   Acute on chronic diastolic CHF (congestive heart failure)  (HCC)   Warfarin anticoagulation   Failure to thrive (0-17)   Malnutrition of moderate degree   Palliative care by specialist   AKI (acute kidney injury) ([place])   MRSA infection   DNR (do not resuscitate)    [name] D Finbar Malec. is a 81 y.o. male with  With CKD< CHF, FTT, malnutrition , CDI now with MRSA bacteremia   #1 MRSA Bacteremia:       [place] [facility] Staphylococcus aureus bacteremia   Staphylococcus aureus bacteremia (SAB) is associated with a high rate of complications and mortality.  Specific aspects of clinical management are critical to optimizing the outcome of patients with SAB.  Therefore, the [facility] Antimicrobial Management Team [facility]) has initiated an intervention aimed at improving the management of SAB at [facility].  To do so, Infectious Diseases physicians are providing an evidence-based consult for the management of all patients with SAB.     Yes No Comments  Perform follow-up blood cultures (even if the patient is afebrile) to ensure clearance of bacteremia [x]  []  BLOOD CULTURES FROM 08/15/16 no growth  Remove vascular catheter and obtain follow-up blood cultures after the removal of the catheter [x]  []  DO NOT PLACE PICC OR CENTRAL LINE UNTIL BLOOD CULTURES FROM 4TH ARE [place] AT 4 DAYS   Perform echocardiography to evaluate for endocarditis (transthoracic ECHO is 40-50% sensitive, TEE is > 90% sensitive) [x]  []  Please keep in mind, that neither test can definitively EXCLUDE endocarditis, and that should clinical suspicion remain high for endocarditis the patient should then still be treated with an "endocarditis" duration of therapy = 6 weeks  WOULD GET TTE  Consult electrophysiologist to evaluate implanted cardiac device (pacemaker, ICD) []  []  NA  Ensure source control [x]  []  Have all abscesses been drained effectively? Have deep seeded infections (septic joints or osteomyelitis) had appropriate surgical debridement?  SOURCE  UNCLEAR  Investigate for "metastatic" sites of infection [x]  []  Does the patient have ANY symptom or physical exam finding that would suggest a deeper infection (back or neck pain that may be suggestive of vertebral osteomyelitis or epidural abscess, muscle pain that could be a symptom of pyomyositis)?  Keep in mind that for deep seeded infections MRI imaging with contrast is preferred rather than other often insensitive tests such as plain x-rays, especially early in a patient's presentation.  Change antibiotic therapy to VANCOMYCIN []  []  Beta-lactam antibiotics are preferred for MSSA due to higher cure rates.   If on Vancomycin, goal trough should be 15 - 20 mcg/mL  Estimated duration of IV antibiotic therapy:  IF CANNOT EXCLUDE ENDOCARDITIS WOULD BE 4-6 WEEK COURSE OF VANCOMYCIN []  []  Consult case management for probably prolonged outpatient IV antibiotic therapy   #2 CDI: complete 2 weeks of vancomycin po    LOS: 5 days   Alcide Evener 08/16/2016, 11:24 AM

## 2016-08-16 NOTE — Progress Notes (Signed)
CRITICAL VALUE ALERT  Critical value received:  INR 5.25  Date of notification:  08/16/2016  Time of notification:  0431  Critical value read back: Yes  Nurse who received alert:  Danae Chen  MD notified (1st page):  Chaney Malling Time of first page:  [phone]

## 2016-08-16 NOTE — Progress Notes (Signed)
Progress Note  Patient Name: [name] KATA Sr. Date of Encounter: 08/16/2016  Primary Cardiologist: Dr. Tamala Julian  Subjective   He has no chest pain and no SOB, diarrhea continues.  Inpatient Medications    Scheduled Meds: . amiodarone  100 mg Oral Daily  . amLODipine  5 mg Oral Daily  . carvedilol  25 mg Oral BID WC  . feeding supplement (ENSURE ENLIVE)  237 mL Oral BID BM  . feeding supplement (GLUCERNA SHAKE)  237 mL Oral BID BM  . hydrALAZINE  50 mg Oral Q8H  . insulin aspart  0-15 Units Subcutaneous TID WC  . ipratropium-albuterol  3 mL Nebulization TID  . isosorbide mononitrate  120 mg Oral Daily  . levothyroxine  50 mcg Oral QAC breakfast  . mupirocin ointment  1 application Nasal BID  . nutrition supplement (JUVEN)  1 packet Oral BID WC  . polyethylene glycol  17 g Oral Daily  . senna  1 tablet Oral BID  . sodium chloride flush  3 mL Intravenous Q12H  . tamsulosin  0.4 mg Oral QPC supper  . vancomycin  125 mg Oral QID  . vancomycin  1,000 mg Intravenous Q24H  . Warfarin - Pharmacist Dosing Inpatient   Does not apply q1800   Continuous Infusions:  PRN Meds: sodium chloride, acetaminophen, albuterol, HYDROmorphone (DILAUDID) injection, metoprolol, ondansetron (ZOFRAN) IV, sodium chloride flush   Vital Signs    Vitals:   08/15/16 1855 08/15/16 2156 08/15/16 2221 08/16/16 0549  BP: (!) 124/49  (!) 128/52 (!) 110/52  Pulse: 77  77 83  Resp: 18  16 18   Temp: 97.7 F (36.5 C)  98.8 F (37.1 C) 99.5 F (37.5 C)  TempSrc: Oral  Oral Oral  SpO2: 100% 100% 100% 100%  Weight:    190 lb 1.6 oz (86.2 kg)  Height:        Intake/Output Summary (Last 24 hours) at 08/16/16 0801 Last data filed at 08/16/16 [id number]  Gross per 24 hour  Intake             1033 ml  Output                0 ml  Net             1033 ml   Filed Weights   08/14/16 0425 08/15/16 0635 08/16/16 0549  Weight: 184 lb 4.8 oz (83.6 kg) 181 lb (82.1 kg) 190 lb 1.6 oz (86.2 kg)    Telemetry      SR with PACs - Personally Reviewed  ECG    SR with PACS on the 31st.  - Personally Reviewed  Physical Exam   GEN: No acute distress.   Neck: No JVD Cardiac: RRR, no murmurs, rubs, or gallops.  Respiratory: Clear to auscultation bilaterally- ant. Few rales. GI: Soft, mild tenderness, non-distended  MS: 3+ edema bil to knees; No deformity. Neuro:  Nonfocal, moves all ext, follows commands Oriented X 3  Psych: Normal affect   Labs    Chemistry Recent Labs Lab 08/11/16 1343 08/11/16 1850  08/14/16 1356 08/15/16 0439 08/16/16 0322  NA 136 136  < > 134* 134* 135  K 2.8* 2.5*  < > 5.0 4.3 4.1  CL 100* 100*  < > 104 103 104  CO2 23 23  < > 20* 20* 19*  GLUCOSE 196* 171*  < > 234* 174* 133*  BUN 91* 90*  < > 102* 106* 109*  CREATININE 3.38* 3.27*  < >  3.01* 2.93* 3.29*  CALCIUM 8.4* 8.0*  < > 8.2* 8.0* 8.1*  PROT 7.3 7.0  --  7.1  --   --   ALBUMIN 1.8* 1.8*  --  1.8*  --   --   AST 46* 41  --  73*  --   --   ALT 17 15*  --  25  --   --   ALKPHOS 56 55  --  65  --   --   BILITOT 0.3 0.3  --  0.7  --   --   GFRNONAA 16* 17*  < > 18* 19* 17*  GFRAA 19* 19*  < > 21* 22* 19*  ANIONGAP 13 13  < > 10 11 12   < > = values in this interval not displayed.   Hematology Recent Labs Lab 08/14/16 1356 08/15/16 0439 08/16/16 0322  WBC 20.6* 15.1* 21.7*  RBC 2.72* 2.55* 2.99*  HGB 7.4* 6.8* 8.2*  HCT 22.7* 21.2* 24.8*  MCV 83.5 83.1 82.9  MCH 27.2 26.7 27.4  MCHC 32.6 32.1 33.1  RDW 19.1* 19.2* 18.6*  PLT 272 235 233    Cardiac Enzymes Recent Labs Lab 08/11/16 1850 08/12/16 0120 08/12/16 0704  TROPONINI 0.07* 0.06* 0.06*    Recent Labs Lab 08/11/16 1354  TROPIPOC 0.06     BNP Recent Labs Lab 08/11/16 1343 08/11/16 1850 08/14/16 1356  BNP 355.1* 319.0* 665.3*     DDimer No results for input(s): DDIMER in the last 168 hours.   Radiology    Ct Abdomen Pelvis Wo Contrast  Result Date: 08/14/2016 CLINICAL DATA:  C. difficile colitis. Hx renal artery  stenosis, peptic ulcer, ischemic colitis, internal hemorrhoids hiatal hernia, GERD, gastroparesis, diabetes, transurethral resection of prostate, partial gastrectomy, inguinal hernia repair and mesh insertion. EXAM: CT ABDOMEN AND PELVIS WITHOUT CONTRAST TECHNIQUE: Multidetector CT imaging of the abdomen and pelvis was performed following the standard protocol without IV contrast. COMPARISON:  06/26/2016 FINDINGS: Lower chest: There are bilateral pleural effusions. Bibasilar atelectasis is noted. Mild reticular changes identified along anterior right lower lobe. Status post median sternotomy. There is dense atherosclerotic calcification of the coronary vessels. Heart size is mildly enlarged. Hepatobiliary: Stable appearance of low-attenuation lesions in the liver, most likely representing cysts. Status post cholecystectomy. Pancreas: Poorly defined.  No acute abnormality. Spleen: Normal in size without focal abnormality. Adrenals/Urinary Tract: Adrenal glands are unremarkable. Kidneys are normal, without renal calculi, focal lesion, or hydronephrosis. Bladder is unremarkable. Stomach/Bowel: Stomach has a normal appearance. There is dilatation of small bowel loops. There is marked abnormality of the colon. The colonic wall is markedly thickened and irregular throughout its entire course. The changes have progressed since the prior study. Contrast reaches the rectum at time of exam, excluding complete obstruction. Vascular/Lymphatic: Dense atherosclerotic calcification of the abdominal aorta and its branches. Reproductive: Prostatic calcifications are present. Other: There is diffuse body wall edema which has progressed since the prior study. Musculoskeletal: No acute or significant osseous findings. IMPRESSION: 1. Interval progression of significant irregular thickening of the colonic wall consistent with the history of Celsius diff colitis. There is no evidence for perforation at this time. 2. Bilateral pleural  effusions. 3. Status post cholecystectomy. 4. Small bowel dilatation possibly related to ileus. No evidence for obstruction. 5. Advanced atherosclerotic calcification of the abdominal aorta and its branches. Electronically Signed   By: Nolon Nations M.D.   On: 08/14/2016 21:56   Dg Chest Port 1 View  Result Date: 08/14/2016 CLINICAL DATA:  81 year old male with acute on chronic heart failure. Wheezing and lethargy. Initial encounter. EXAM: PORTABLE CHEST 1 VIEW COMPARISON:  08/12/2016 and earlier. FINDINGS: Portable AP semi upright view at 0924 hours. Mildly improved lung volumes and bibasilar ventilation. Stable cardiomegaly and mediastinal contours. Calcified aortic atherosclerosis. No pneumothorax or pulmonary edema. No definite pleural effusion or consolidation. Residual patchy bibasilar opacity most resembles atelectasis. Negative visible bowel gas pattern. IMPRESSION: Improved lung volumes with regressed bibasilar atelectasis. Stable cardiomegaly and Calcified aortic atherosclerosis. No pulmonary edema. Electronically Signed   By: Genevie Ann M.D.   On: 08/14/2016 09:36    Cardiac Studies   TEE Study Conclusions from 04/2016  - Left ventricle: Wall thickness was increased in a pattern of moderate LVH. Systolic function was normal. The estimated ejection fraction was in the range of 55% to 60%. Wall motion was normal; there were no regional wall motion abnormalities. - Aortic valve: There was trivial regurgitation. - Aorta: Severe non-mobile atherosclerotic plaque. - Ascending aorta: The ascending aorta was normal in size. - Mitral valve: There was mild regurgitation. - Left atrium: The atrium was dilated. No evidence of thrombus in the atrial cavity or appendage. - Right ventricle: Systolic function was normal. - Right atrium: No evidence of thrombus in the atrial cavity or appendage. - Atrial septum: No defect or patent foramen ovale was identified. - Tricuspid valve: There  was mild-moderate regurgitation.  Impressions:  - Tricuspid valve is significantly thickened, predominantly the posterior leaflet. There is a mobile echodensity measuring 1.6 x 1.5 cm attached to the posterior leaflet suspicious for a vegetation.   TTE Study Conclusions from 03/2016  - Left ventricle: The cavity size was normal. Wall thickness was increased in a pattern of moderate LVH. Systolic function was normal. The estimated ejection fraction was in the range of 55% to 60%. - Aortic valve: Valve area (VTI): 1.63 cm^2. Valve area (Vmax): 1.56 cm^2. Valve area (Vmean): 1.63 cm^2. - Mitral valve: There was mild regurgitation. - Left atrium: The atrium was mildly dilated. - Right atrium: Appears to be retained RA lead in RA by history the RV lead has been removed from heart - Atrial septum: No defect or patent foramen ovale was identified. - Tricuspid valve: Cannot r/o vegetation beneath septal leaflet of TV in RVOT seen best on basal SA images May represent chordae but would suggest f/u TEE. - Pulmonary arteries: PA peak pressure: 53 mm Hg (S). - Pericardium, extracardiac: There was a left pleural effusion Patient Profile     81 y.o. 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  Assessment & Plan    1. Chronic diastolic CHF - BNP minimally elevated to 319 then to 666.  Cr. At 3.29 today . His baseline in past has been 2.7  Now with fever, UTI followed by IM and ID. See below - he is + 6,704 since admit and wt up from 185 to 190 lbs.  2. Failure to thrive -  Palliative care is following - pt is DNR. Plan at d/c is SNF  with hospice.  While DNR continued aggressive therapy.   3. PAF maintaining SR  - CHADSVASc score of 5.  Coumadin per pharmacy. On amio.  Now INR is 5.25 followed by pharmacy   4. Elevated TSH - TSH has been elevated since 05/2016. Most recent 08/11/16 32.67. He is amiodarone as well. LFT normal. IM  Following  5. CKD - Baseline creatinine around 2.8. Today 3.29 . Lasix is on hold  6. Chronic anemia - Hgb 8.2. Was 6.8 yesterday. rec'd 1 unit PRBCs   7. Hypokalemia - resolved.   8. C. Diff on po vanc.   9. Sepsis due to MRSA bacteremia, Staph aureus UTI and C. Dif.  On po vancomycin   Signed, Cecilie Kicks, NP  08/16/2016, 8:01 AM    Attending Note:   The patient was seen and examined.  Agree with assessment and plan as noted above.  Changes made to the above note as needed.  Patient seen and independently examined with Cecilie Kicks, NP.   We discussed all aspects of the encounter. I agree with the assessment and plan as stated above.  1.   CHF  - has multiple severe conditions  - his CHF appears to be stable   The plan is for the patient to go to SNF with hospice care.   Will sign off Call for questions    I have spent a total of 40 minutes with patient reviewing hospital  notes , telemetry, EKGs, labs and examining patient as well as establishing an assessment and plan that was discussed with the patient. > 50% of time was spent in direct patient care.    Thayer Headings, Brooke Bonito., MD, [facility] 08/16/2016, 12:55 PM [address],  [place] Pager [phone]

## 2016-08-16 NOTE — Progress Notes (Signed)
Nutrition Follow-up  DOCUMENTATION CODES:   Non-severe (moderate) malnutrition in context of chronic illness  INTERVENTION:  Provide Ensure Enlive po BID, each supplement provides 350 kcal and 20 grams of protein  Provide 1 packet Juven BID  Provide Multivitamin with minerals daily   NUTRITION DIAGNOSIS:   Malnutrition related to chronic illness, poor appetite as evidenced by energy intake < 75% for > or equal to 1 month, moderate depletion of body fat, mild depletion of muscle mass.  Ongoing  GOAL:   Patient will meet greater than or equal to 90% of their needs  Unmet  MONITOR:   PO intake, Supplement acceptance, Skin, Weight trends, Labs, I & O's  REASON FOR ASSESSMENT:   Malnutrition Screening Tool    ASSESSMENT:   81 year old male with a history of CAD status post CABG 1994 with subsequent stents and repeat CABG in 2013, hypertension, HLD, CKD, chronic diastolic heart failure, and DM.   Pt reports that he ate 50% of breakfast this morning. Per nursing notes, pt is eating 0% to 25% of most meals. His weight trended down to 181 lbs yesterday, but back up today. He states that he continues to drink Glucerna Shakes, but per nursing notes pt has refused the last 3 Glucerna Shakes and Ensure shakes offered.   Labs: low hemoglobin, high BUN (109 mg/dL), low calcium, elevated INR, glucose ranging 128 to 239 mg/dL   Diet Order:  Diet regular Room service appropriate? Yes; Fluid consistency: Thin  Skin:  Wound (see comment) (stage III pressure injury on sacrum)  Last BM:  2/5  Height:   Ht Readings from Last 1 Encounters:  08/11/16 5\' 10"  (1.778 m)    Weight:   Wt Readings from Last 1 Encounters:  08/16/16 190 lb 1.6 oz (86.2 kg)    Ideal Body Weight:  75.45 kg  BMI:  Body mass index is 27.28 kg/m.  Estimated Nutritional Needs:   Kcal:  1900-2100  Protein:  100-115 grams  Fluid:  1.9-2.1 L/day  EDUCATION NEEDS:   No education needs identified  at this time  [place], CSP, LDN Inpatient Clinical Dietitian Pager: [phone] After Hours Pager: [phone]

## 2016-08-17 ENCOUNTER — Inpatient Hospital Stay (HOSPITAL_COMMUNITY): Payer: Medicare Other

## 2016-08-17 DIAGNOSIS — R0602 Shortness of breath: Secondary | ICD-10-CM

## 2016-08-17 LAB — GLUCOSE, CAPILLARY
GLUCOSE-CAPILLARY: 159 mg/dL — AB (ref 65–99)
GLUCOSE-CAPILLARY: 163 mg/dL — AB (ref 65–99)
Glucose-Capillary: 157 mg/dL — ABNORMAL HIGH (ref 65–99)
Glucose-Capillary: 140 mg/dL — ABNORMAL HIGH (ref 65–99)

## 2016-08-17 LAB — CBC
HCT: 24 % — ABNORMAL LOW (ref 39.0–52.0)
Hemoglobin: 7.9 g/dL — ABNORMAL LOW (ref 13.0–17.0)
MCH: 26.9 pg (ref 26.0–34.0)
MCHC: 32.9 g/dL (ref 30.0–36.0)
MCV: 81.6 fL (ref 78.0–100.0)
PLATELETS: 273 10*3/uL (ref 150–400)
RBC: 2.94 MIL/uL — AB (ref 4.22–5.81)
RDW: 18.3 % — AB (ref 11.5–15.5)
WBC: 22.6 10*3/uL — AB (ref 4.0–10.5)

## 2016-08-17 LAB — CULTURE, BLOOD (ROUTINE X 2)

## 2016-08-17 LAB — BASIC METABOLIC PANEL
ANION GAP: 14 (ref 5–15)
BUN: 127 mg/dL — ABNORMAL HIGH (ref 6–20)
CALCIUM: 8.5 mg/dL — AB (ref 8.9–10.3)
CO2: 19 mmol/L — ABNORMAL LOW (ref 22–32)
Chloride: 102 mmol/L (ref 101–111)
Creatinine, Ser: 3.81 mg/dL — ABNORMAL HIGH (ref 0.61–1.24)
GFR calc Af Amer: 16 mL/min — ABNORMAL LOW (ref >60)
GFR, EST NON AFRICAN AMERICAN: 14 mL/min — AB (ref >60)
Glucose, Bld: 155 mg/dL — ABNORMAL HIGH (ref 65–99)
POTASSIUM: 4.2 mmol/L (ref 3.5–5.1)
SODIUM: 135 mmol/L (ref 135–145)

## 2016-08-17 LAB — PROTIME-INR
INR: 6.34 — AB
PROTHROMBIN TIME: 57.9 s — AB (ref 11.4–15.2)

## 2016-08-17 MED ORDER — COLLAGENASE 250 UNIT/GM EX OINT
TOPICAL_OINTMENT | Freq: Every day | CUTANEOUS | Status: DC
  Administered 2016-08-17: 16:00:00 via TOPICAL
  Administered 2016-08-18: 1 via TOPICAL
  Administered 2016-08-19: 10:00:00 via TOPICAL
  Filled 2016-08-17: qty 30

## 2016-08-17 MED ORDER — IPRATROPIUM-ALBUTEROL 0.5-2.5 (3) MG/3ML IN SOLN
3.0000 mL | Freq: Four times a day (QID) | RESPIRATORY_TRACT | Status: DC
  Administered 2016-08-17: 3 mL via RESPIRATORY_TRACT
  Filled 2016-08-17 (×2): qty 3

## 2016-08-17 NOTE — Progress Notes (Signed)
CRITICAL VALUE ALERT  Critical value received:  INR  Date of notification:  08/17/2016  Time of notification:  1130  Critical value read back:yes  Nurse who received alert:  Karyl Kinnier  MD notified (1st page):  08/17/16  Time of first page:  1131  MD notified (2nd page):  Time of second page:  Responding MD:    Time MD responded:

## 2016-08-17 NOTE — Progress Notes (Signed)
                                                   Daily Progress Note   Patient Name: [name] D Racette Sr.       Date: 08/17/2016 DOB: 04/14/1936  Age: 81 y.o. MRN#: [id number] Attending Physician: Iskra M Myers, MD Primary Care Physician: LITTLE,KEVIN LORNE, MD Admit Date: 08/11/2016  Reason for Consultation/Follow-up: Disposition and Psychosocial/spiritual support  Subjective: [name] was very lethargic this morning. He had not eaten any breakfast, but told me he was planning on a large lunch. Diarrhea is ongoing, with 3-4 bowel movements yesterday per his report. He has no other acute complaints, just feels "worn out and tired."  Length of Stay: 6  Current Medications: Scheduled Meds:  . amiodarone  100 mg Oral Daily  . amLODipine  5 mg Oral Daily  . carvedilol  25 mg Oral BID WC  . feeding supplement (ENSURE ENLIVE)  237 mL Oral BID BM  . hydrALAZINE  50 mg Oral Q8H  . insulin aspart  0-15 Units Subcutaneous TID WC  . isosorbide mononitrate  120 mg Oral Daily  . levothyroxine  50 mcg Oral QAC breakfast  . nutrition supplement (JUVEN)  1 packet Oral BID WC  . polyethylene glycol  17 g Oral Daily  . senna  1 tablet Oral BID  . sodium chloride flush  3 mL Intravenous Q12H  . tamsulosin  0.4 mg Oral QPC supper  . vancomycin  125 mg Oral QID  . vancomycin  1,000 mg Intravenous Q48H  . Warfarin - Pharmacist Dosing Inpatient   Does not apply q1800    Continuous Infusions:  PRN Meds: sodium chloride, acetaminophen, albuterol, HYDROmorphone (DILAUDID) injection, ipratropium-albuterol, metoprolol, ondansetron (ZOFRAN) IV, sodium chloride flush  Physical Exam    Constitutional: Vital signs are normal. He appears lethargic. He has a sickly appearance.  Frail man lying in bed  HENT:  Head: Normocephalic and atraumatic.  Mouth/Throat: Mucous membranes are dry. No oropharyngeal  exudate.  Eyes: EOM are normal.  Neck: Normal range of motion.  Pulmonary/Chest: Accessory muscle usage present.  Dyspnea at rest, requires repeated breaks during brief conversation to catch breath. Musculoskeletal:  Generalized weakness  Neurological: He appears lethargic.  Fully oriented and conversational, just slow to respond Psychiatric: He has a normal mood and affect. Judgment and thought content normal. His speech is delayed. He is slowed and withdrawn. Cognition and memory are normal.        Vital Signs: BP (!) 110/59 (BP Location: Right Arm)   Pulse 84   Temp 99 F (37.2 C) (Oral)   Resp 20   Ht 5' 10" (1.778 m)   Wt 92.5 kg (203 lb 14.4 oz)   SpO2 100%   BMI 29.26 kg/m  SpO2: SpO2: 100 % O2 Device: O2 Device: Nasal Cannula O2 Flow Rate: O2 Flow Rate (L/min): 2 L/min  Intake/output summary:   Intake/Output Summary (Last 24 hours) at 08/17/16 0945 Last data filed at 08/17/16 0703  Gross per 24 hour  Intake              120 ml  Output                0 ml  Net                120 ml   LBM: Last BM Date: 08/14/16 Baseline Weight: Weight: 90.7 kg (200 lb) Most recent weight: Weight: 92.5 kg (203 lb 14.4 oz)  Palliative Assessment/Data: PPS 40%   Flowsheet Rows   Flowsheet Row Most Recent Value  Intake Tab  Referral Department  Cardiology  Unit at Time of Referral  Cardiac/Telemetry Unit  Palliative Care Primary Diagnosis  Cardiac  Date Notified  08/11/16  Palliative Care Type  Return patient Palliative Care  Reason for referral  Clarify Goals of Care  Date of Admission  08/11/16  # of days IP prior to Palliative referral  0  Clinical Assessment  Psychosocial & Spiritual Assessment  Palliative Care Outcomes      Patient Active Problem List   Diagnosis Date Noted  . DNR (do not resuscitate)   . MRSA infection   . AKI (acute kidney injury) (HCC)   . Malnutrition of moderate degree 08/13/2016  . Palliative care by specialist   . Failure to thrive (0-17)  08/11/2016  . Serratia marcescens infection (HCC) 07/31/2016  . Foreskin does not retract 07/31/2016  . Ulcer of urethral meatus 07/31/2016  . Hyperlipidemia 07/06/2016  . BPH (benign prostatic hyperplasia) 07/06/2016  . Acute back pain 06/24/2016  . Acute on chronic diastolic CHF (congestive heart failure) (HCC) 06/24/2016  . Debility 06/24/2016  . Leg weakness, bilateral 06/24/2016  . Pseudomonas urinary tract infection 06/24/2016  . Fall 06/24/2016  . Warfarin anticoagulation 06/24/2016  . Encounter for therapeutic drug monitoring 06/07/2016  . Pressure injury of skin 05/27/2016  . Acute deep vein thrombosis (DVT) of right upper extremity (HCC) 05/25/2016  . History of pacemaker 05/03/2016  . Anemia, iron deficiency 04/16/2016  . Endocarditis of tricuspid valve   . C. difficile colitis 04/10/2016  . HCAP (healthcare-associated pneumonia) 04/08/2016  . Chronic kidney disease, stage III (moderate) 03/09/2016  . Pressure ulcer 02/23/2016  . Seizure (HCC) 02/20/2016  . Palliative care encounter   . Goals of care, counseling/discussion   . Pacemaker infection (HCC)   . Acute renal failure superimposed on stage 4 chronic kidney disease (HCC) 01/26/2016  . MRSA bacteremia   . Pseudogout involving multiple joints 01/24/2016  . Bladder mass 01/24/2016  . Hemoptysis   . Pain   . Left upper extremity swelling   . Staphylococcus aureus bacteremia 01/17/2016  . CAP (community acquired pneumonia) 01/16/2016  . S/P hernia repair 10/28/2015  . Right inguinal hernia 09/09/2015  . GIB (gastrointestinal bleeding) 08/29/2015  . Hematuria 05/19/2015  . Thrombocytopenia (HCC) 11/11/2014  . Leukopenia 11/11/2014  . B12 deficiency 08/31/2013  . Pacemaker 08/26/2013  . On amiodarone therapy 08/21/2013  . Gout due to renal impairment, left ankle and foot 06/13/2013    Class: Acute  . CAD of autologous bypass graft 06/05/2013  . MDS (myelodysplastic syndrome) (HCC) 05/22/2013  . Anemia of  chronic disease 03/19/2013  . CKD (chronic kidney disease), stage IV (HCC) 03/18/2013  . NSVT (nonsustained ventricular tachycardia) (HCC) 03/18/2013  . Chronic diastolic heart failure (HCC) 03/17/2013  . Diabetes mellitus (HCC) 03/17/2013  . Essential hypertension 03/17/2013  . Mesenteric artery stenosis (HCC) 05/01/2012  . PAF (paroxysmal atrial fibrillation) (HCC) 12/24/2011  . PAD (peripheral artery disease) (HCC) 12/24/2011  . Chronic vascular insufficiency of intestine (HCC) 12/20/2011  . Personal history of DVT (deep vein thrombosis) 07/12/2009    Palliative Care Assessment & Plan   HPI: 80 y.o. male  with past medical history of dCHF, bradycardia with prior PPM placement now removed post   endocarditis, HTN, PAF on coumadin, CKD, recurrent C. Diff, DVT in RUE, hypothyroidism, and failure to thrive who presented to his cardiologist office for follow-up after they increased his diuretic and was found to be extremely weak, decreased alertness, and poor oral intake. He was subsequently admitted on 08/11/2016 for failure to thrive. The plan with admission is to attempt to get him feeling better, as well as facilitate a meeting with palliative care to discuss goals of care.  Assessment: I met with Mr. Dennard and extended family on 2/2. Please see my initial consult note for full details of that conversation. In brief, Mr. Murrillo elected to pursue comfort care with Hospice. After evaluation and a follow-up conversation with [facility], they did not feel he meet residential hospice criteria. Of note, the main reason for this was improvement in his oral intake since hospitalization and ability to take oral medication. Prior to admission he was eating minimally (half a meal per day, per pt), however his appetite has improved since being here and he is eating the majority of breakfast and lunch now. SW was able to determine that he could have Hospice at his SNF for no change in cost. I  spoke with his daughter on 2/2 in the evening to relate this, which she was in agreement with.   Unfortunately, prior to discharge he developed a fever. Work-up revealed sepsis with MRSA UTI and MRSA bacteremia, he also has known recurrent C. Diff. Hospitalist service is now primary (had been cardiology), with input from infectious disease. I had met with pt and family on 2/3 and discussed concern for infection (prior to cultures returning), and the decision was made to medically optimize (i.e investigate and treat) prior to discharge. Palliative also followed-up on 2/5 to support family in ongoing goals of care conversations, and DNR status and plan to treat what is treatable re-confirmed.   Today, I met with Mr. Luu at his bedside. There was no other family present. He was not clear on what the infection work-up had shown, so we discussed the MRSA UTI and bacteremia. I also explained what Sepsis meant, as he had heard the term but didn't know what it was. Symptomatically, his only concern is ongoing fatigue and generalized weakness.   Recommendations/Plan:  DNR, plan to medically optimize then discharge to SNF with Hospice (he remains in support of this)   Symptoms:  Pain/dyspnea: PRN IV dilaudid (avoid morphine d/t renal fxn and risk for toxic metabolite build-up)  Wheezing: PRN duo-nebs and albuterol  Diarrhea: Known C. Diff with ongoing treatment, stopped Mira-lax and senna  Goals of Care and Additional Recommendations:  Mr. Mavis will leave here with Hospice support, however he would like to be medically optimized with the plan to treat what is treatable prior to discharge.   Code Status:  DNR  Prognosis:   < 6 months; prognosis is difficult to determine at this juncture. Mr. Read has chronic issues with progressive failure to thrive, however his intake has improved since hospitalization and he remains able to take all of his medications without issue. He is currently  being treated for sepsis.   Discharge Planning:  [place] with Hospice  Care plan was discussed with pt.  Thank you for allowing the Palliative Medicine Team to assist in the care of this patient.  Total time: 25 minutes    Greater than 50%  of this time was spent counseling and coordinating care related to the above assessment and plan.  Charlynn Court, NP Palliative Medicine Team [phone] pager (7a-5p) Team Phone # [phone]

## 2016-08-17 NOTE — Progress Notes (Signed)
Pt unable to urinate with no urge, done a bladder scan 20ml Notified Md. Pt also refused to eat medication flomax and coreg

## 2016-08-17 NOTE — Progress Notes (Addendum)
Patient ID: [name]., male   DOB: August 08, 1935, 81 y.o.   MRN: [id number]    PROGRESS NOTE   [name]  [id number] DOB: 03-03-1936 DOA: 08/11/2016  PCP: [name], MD   Brief Narrative:  81 y.o. male  significant for atrial fibrillation, coronary artery disease, CK D, diabetes, gastroparesis, GERD, gout, hypertension. Patient was originally admitted by cardiology on the 31st for weakness and fatigue thought to be due to congestive heart failure. Patient had a fever overnight 102. Patient is still very tired and weak per nursing. No other acute events overnight.  Assessment & Plan:   Active Problems: Sepsis secondary to MRSA bacteremia, Staph aureus UTI, C. Diff - started vancomycin IV 2/4, ID team consulted and assistance is appreciated  - zosyn stopped per ID team as no indication to continue and pt also with C. Diff - no plan to obtain TEE as pt with rather guarded prognosis and determined not to be a candidate for aggressive interventions  - repeat blood cultures done 2/4, so far no growth  - will need 4-6 weeks course of IV Vancomycin  - worrisome persistent rise in WBC, monitor closely  - CBC in AM  Recurrent C diff - per ID, stop IV Flagyl and continue only PO Vancomycin, will need two weeks therapy  - pt with ongoing diarrhea, WBC trending up  Staph aureus UTI - vancomycin IV already on board as noted above   Acute respiratory failure   - more wheezing this AM - added bronchodilators scheduled and as needed  - repeat CXR to evaluate   Chronic diastolic CHF - with persistent LE edema  - stopped IVF 2/4, lasix also held due to worsening Cr, up from 2.93 --> 3.29 --> 3.81 this AM  - this is the weight trend in the past 72 hours  Filed Weights   08/15/16 0635 08/16/16 0549 08/17/16 0532  Weight: 82.1 kg (181 lb) 86.2 kg (190 lb 1.6 oz) 92.5 kg (203 lb 14.4 oz)  - continue to monitor daily weights, strict I/O - continue Coreg, Imdur,  Hydralazine  - appreciate cardiology team following, cardiology singed off as no further recommendations  - worsening renal failure precludes use of Lasix for now - may provide for symptomatic management if pt not better   Hypotension - with SBP in 110's this AM - may need to hold some of the antihypertensive meds - pt currently on Norvasc, Imdur, Coreg, Hydralazine  - I already reduced hydralazine from Q6 hours to TID and also cut the dose down from 75 to 50 mg, this was done 2/4 - monitor BP closely   CAD - S/p CABG and redo CABG.  No chest pain.   - Trop mildly elevated w/ flat trend - Suspect demand ischemia in the setting of diast CHF/generalized illness - no aspirin as pt already on Coumadin per cardiology  - no plan for further ischemic work up   PAF, CHADS 2 score 5 - Cont amio/? blocker/coumadin - on tele monitor   Right Upper Ext DVT - On coumadin - INR 5.25 --> 6.34 - currently no signs of active bleeding - if INR continues trending up, give dose of vit K  Hypokalemia  - supplemented and WNL this AM  Hypomagnesemia - supplemented and WNL this AM   Normocytic anemia - Hg drop from 7.4 --> 6.8 -->  8.2 --> 7.9 - s/p 1 U PRBC transfusion 2/4 - CBC in AM  Hypothyroidism -  TSH 32.676 on admission 1/31.  This was up from 22.371 on 12/15 - currently on synthroid 75 mcg QD   Acute renal failure superimposed on stage 4 chronic kidney disease ([place]) - holding on IVF as pt with more LE edema - no lasix either as Cr continues trending up - also worrisome for giving Vancomycin, will obtain renal US - paged ID to discuss vancomycin and possible change in ABX regimen given progressive renal injury - BMP in AM  Moderate PCM - appreciate nutritionist recommendations   Decubitus ulcer, sacral area, also bilateral heals - Sacrum with unstageable pressure injury; 6X6cm, 10% red, 90% tightly adhered yellow slough. - Pt is frequently incontinent and it is difficult to  keep the wound from becoming soiled. - Right outer ankle with previously noted unstageable pressure injury; it has evolved into stage 2 pressure injury during this assessment.  .2X.2X.1cm, pink and dry, no odor or drainage - Right heel with clear fluid filled intact blister; stage 2 pressure injury; 1X2cm - Left outer heel with the same appearance; .5X3cm - awaiting Hospice placement - Santyl ointment for enzymatic debridement of nonviable tissue to sacrum.   Float heels and air mattress to reduce pressure, foam dresssings to protect bilat heels and right ankle from further injury.  Acute functional quadriplegia - in the setting of multiple acute medical conditions and co morbidities - family hopeful for SNF discharge with hospice however, prognosis guarded  - pt is poorly responsive to current medical care, Cr trending up, vancomycin IV on board but worsening renal failure may require change in ABX, diarrhea worse on oral vancomycin, if we have to change to more broad ABX there is a high concern of C. Diff relapse when it has not gotten a change to be treated fully, WBC is also trending up and this is worrisome as well  - will continue our effort in helping pt recover but as noted above prognosis is guarded   DVT prophylaxis: On Coumadin  Code Status: DNR Family Communication: No family at bedside this AM Disposition Plan: To be determined, ? SNF with hospice    Consultants:   Cardiology  PCT  Procedures:   None  Antimicrobials:   Vancomycin IV 2/4 -->   Vancomycin PO 2/2 -->  Subjective: No events overnight.   Objective: Vitals:   08/17/16 0532 08/17/16 1400 08/17/16 1429 08/17/16 1559  BP: (!) 110/59 (!) 96/57 (!) 96/57 116/63  Pulse: 84   83  Resp: 20   18  Temp: 99 F (37.2 C)   98.4 F (36.9 C)  TempSrc: Oral   Oral  SpO2: 100%   100%  Weight: 92.5 kg (203 lb 14.4 oz)     Height:        Intake/Output Summary (Last 24 hours) at 08/17/16 1714 Last data filed  at 08/17/16 1600  Gross per 24 hour  Intake              480 ml  Output                0 ml  Net              480 ml   Filed Weights   08/15/16 0635 08/16/16 0549 08/17/16 0532  Weight: 82.1 kg (181 lb) 86.2 kg (190 lb 1.6 oz) 92.5 kg (203 lb 14.4 oz)    Examination:  General exam: Appears more alert, NAD Respiratory system: Respiratory effort normal, diminished breath sounds at bases  Cardiovascular system:  RRR. No rubs, gallops or clicks. +2 bilateral LE edema Gastrointestinal system: Abdomen is nondistended, soft and nontender. Central nervous system: lethargic   Data Reviewed: I have personally reviewed following labs and imaging studies  CBC:  Recent Labs Lab 08/11/16 1343 08/11/16 1850 08/14/16 1356 08/15/16 0439 08/16/16 0322 08/17/16 1002  WBC 12.8* 14.2* 20.6* 15.1* 21.7* 22.6*  NEUTROABS 10.5* 11.7*  --  13.6*  --   --   HGB 8.3* 8.1* 7.4* 6.8* 8.2* 7.9*  HCT 25.6* 25.1* 22.7* 21.2* 24.8* 24.0*  MCV 86.8 85.7 83.5 83.1 82.9 81.6  PLT 241 261 272 235 233 [id number]   Basic Metabolic Panel:  Recent Labs Lab 08/11/16 1850  08/13/16 0448 08/14/16 1356 08/15/16 0439 08/16/16 0322 08/17/16 1002  NA 136  < > 135 134* 134* 135 135  K 2.5*  < > 3.5 5.0 4.3 4.1 4.2  CL 100*  < > 101 104 103 104 102  CO2 23  < > 21* 20* 20* 19* 19*  GLUCOSE 171*  < > 200* 234* 174* 133* 155*  BUN 90*  < > 96* 102* 106* 109* 127*  CREATININE 3.27*  < > 3.26* 3.01* 2.93* 3.29* 3.81*  CALCIUM 8.0*  < > 8.0* 8.2* 8.0* 8.1* 8.5*  MG 1.3*  --   --  1.3* 1.3* 1.7  --   PHOS  --   --   --  2.6 3.3  --   --   < > = values in this interval not displayed. Liver Function Tests:  Recent Labs Lab 08/11/16 1343 08/11/16 1850 08/14/16 1356  AST 46* 41 73*  ALT 17 15* 25  ALKPHOS 56 55 65  BILITOT 0.3 0.3 0.7  PROT 7.3 7.0 7.1  ALBUMIN 1.8* 1.8* 1.8*    Recent Labs Lab 08/11/16 1343  LIPASE 71*    Recent Labs Lab 08/11/16 1343  AMMONIA 13   Coagulation Profile:  Recent  Labs Lab 08/12/16 0955 08/13/16 0448 08/14/16 1356 08/16/16 0322 08/17/16 1002  INR 2.91 2.92 2.97 5.25* 6.34*   Cardiac Enzymes:  Recent Labs Lab 08/11/16 1850 08/12/16 0120 08/12/16 0704  TROPONINI 0.07* 0.06* 0.06*   CBG:  Recent Labs Lab 08/16/16 1702 08/16/16 2142 08/17/16 0744 08/17/16 1140 08/17/16 1649  GLUCAP 152* 146* 159* 163* 157*   Urine analysis:    Component Value Date/Time   COLORURINE YELLOW 08/11/2016 1556   APPEARANCEUR CLOUDY (A) 08/11/2016 1556   LABSPEC 1.010 08/11/2016 1556   LABSPEC 1.010 05/19/2015 1231   PHURINE 5.0 08/11/2016 1556   GLUCOSEU NEGATIVE 08/11/2016 1556   GLUCOSEU Negative 05/19/2015 1231   HGBUR MODERATE (A) 08/11/2016 [place] 08/11/2016 1556   BILIRUBINUR Negative 05/19/2015 [place] 08/11/2016 1556   PROTEINUR 30 (A) 08/11/2016 1556   UROBILINOGEN 0.2 05/19/2015 1231   NITRITE NEGATIVE 08/11/2016 1556   LEUKOCYTESUR LARGE (A) 08/11/2016 1556   LEUKOCYTESUR Negative 05/19/2015 1231   Recent Results (from the past 240 hour(s))  MRSA PCR Screening     Status: Abnormal   Collection Time: 08/11/16  9:25 PM  Result Value Ref Range Status   MRSA by PCR POSITIVE (A) NEGATIVE Final    Comment:        The GeneXpert MRSA Assay (FDA approved for NASAL specimens only), is one component of a comprehensive MRSA colonization surveillance program. It is not intended to diagnose MRSA infection nor to guide or monitor treatment for MRSA infections. RESULT CALLED TO, READ BACK  BY AND VERIFIED WITH: E.GARNETT,RN AT 2329 BY L.PITT 08/11/16   C difficile quick scan w PCR reflex     Status: Abnormal   Collection Time: 08/12/16  5:52 PM  Result Value Ref Range Status   C Diff antigen POSITIVE (A) NEGATIVE Final   C Diff toxin POSITIVE (A) NEGATIVE Final   C Diff interpretation Toxin producing C. difficile detected.  Final    Comment: CRITICAL RESULT CALLED TO, READ BACK BY AND VERIFIED  WITH: Nilda Riggs RN [id number] 08/12/16 A BROWNING   Culture, Urine     Status: Abnormal   Collection Time: 08/14/16 12:23 PM  Result Value Ref Range Status   Specimen Description URINE, RANDOM  Final   Special Requests Oral vanc  Final   Culture (A)  Final    >=100,000 COLONIES/mL METHICILLIN RESISTANT STAPHYLOCOCCUS AUREUS   Report Status 08/16/2016 FINAL  Final   Organism ID, Bacteria METHICILLIN RESISTANT STAPHYLOCOCCUS AUREUS (A)  Final      Susceptibility   Methicillin resistant staphylococcus aureus - MIC*    CIPROFLOXACIN >=8 RESISTANT Resistant     GENTAMICIN <=0.5 SENSITIVE Sensitive     NITROFURANTOIN <=16 SENSITIVE Sensitive     OXACILLIN >=4 RESISTANT Resistant     TETRACYCLINE <=1 SENSITIVE Sensitive     VANCOMYCIN 1 SENSITIVE Sensitive     TRIMETH/SULFA >=320 RESISTANT Resistant     CLINDAMYCIN <=0.25 SENSITIVE Sensitive     RIFAMPIN <=0.5 SENSITIVE Sensitive     Inducible Clindamycin NEGATIVE Sensitive     * >=100,000 COLONIES/mL METHICILLIN RESISTANT STAPHYLOCOCCUS AUREUS  Respiratory Panel by PCR     Status: None   Collection Time: 08/14/16 12:23 PM  Result Value Ref Range Status   Adenovirus NOT DETECTED NOT DETECTED Final   Coronavirus 229E NOT DETECTED NOT DETECTED Final   Coronavirus HKU1 NOT DETECTED NOT DETECTED Final   Coronavirus NL63 NOT DETECTED NOT DETECTED Final   Coronavirus OC43 NOT DETECTED NOT DETECTED Final   Metapneumovirus NOT DETECTED NOT DETECTED Final   Rhinovirus / Enterovirus NOT DETECTED NOT DETECTED Final   Influenza A NOT DETECTED NOT DETECTED Final   Influenza B NOT DETECTED NOT DETECTED Final   Parainfluenza Virus 1 NOT DETECTED NOT DETECTED Final   Parainfluenza Virus 2 NOT DETECTED NOT DETECTED Final   Parainfluenza Virus 3 NOT DETECTED NOT DETECTED Final   Parainfluenza Virus 4 NOT DETECTED NOT DETECTED Final   Respiratory Syncytial Virus NOT DETECTED NOT DETECTED Final   Bordetella pertussis NOT DETECTED NOT DETECTED Final    Chlamydophila pneumoniae NOT DETECTED NOT DETECTED Final   Mycoplasma pneumoniae NOT DETECTED NOT DETECTED Final  Culture, blood (Routine X 2) w Reflex to ID Panel     Status: Abnormal   Collection Time: 08/14/16  1:45 PM  Result Value Ref Range Status   Specimen Description BLOOD RIGHT ANTECUBITAL  Final   Special Requests BOTTLES DRAWN AEROBIC AND ANAEROBIC 5CC  Final   Culture  Setup Time   Final    GRAM POSITIVE COCCI IN CLUSTERS IN BOTH AEROBIC AND ANAEROBIC BOTTLES CRITICAL RESULT CALLED TO, READ BACK BY AND VERIFIED WITH: E MARTIN,PHARMD AT 1013 08/15/16 BY L BENFIELD    Culture METHICILLIN RESISTANT STAPHYLOCOCCUS AUREUS (A)  Final   Report Status 08/17/2016 FINAL  Final   Organism ID, Bacteria METHICILLIN RESISTANT STAPHYLOCOCCUS AUREUS  Final      Susceptibility   Methicillin resistant staphylococcus aureus - MIC*    CIPROFLOXACIN >=8 RESISTANT Resistant  ERYTHROMYCIN >=8 RESISTANT Resistant     GENTAMICIN <=0.5 SENSITIVE Sensitive     OXACILLIN >=4 RESISTANT Resistant     TETRACYCLINE <=1 SENSITIVE Sensitive     VANCOMYCIN <=0.5 SENSITIVE Sensitive     TRIMETH/SULFA >=320 RESISTANT Resistant     CLINDAMYCIN <=0.25 SENSITIVE Sensitive     RIFAMPIN <=0.5 SENSITIVE Sensitive     Inducible Clindamycin NEGATIVE Sensitive     * METHICILLIN RESISTANT STAPHYLOCOCCUS AUREUS  Blood Culture ID Panel (Reflexed)     Status: Abnormal   Collection Time: 08/14/16  1:45 PM  Result Value Ref Range Status   Enterococcus species NOT DETECTED NOT DETECTED Final   Listeria monocytogenes NOT DETECTED NOT DETECTED Final   Staphylococcus species DETECTED (A) NOT DETECTED Final    Comment: CRITICAL RESULT CALLED TO, READ BACK BY AND VERIFIED WITH: E MARTIN,PHARMD AT 1013 08/15/16 BY L BENFIELD    Staphylococcus aureus DETECTED (A) NOT DETECTED Final    Comment: Methicillin (oxacillin)-resistant Staphylococcus aureus (MRSA). MRSA is predictably resistant to beta-lactam antibiotics (except  ceftaroline). Preferred therapy is vancomycin unless clinically contraindicated. Patient requires contact precautions if  hospitalized. CRITICAL RESULT CALLED TO, READ BACK BY AND VERIFIED WITH: E MARTIN,PHARMD AT 1013 08/15/16 BY L BENFIELD    Methicillin resistance DETECTED (A) NOT DETECTED Final    Comment: CRITICAL RESULT CALLED TO, READ BACK BY AND VERIFIED WITH: E MARTIN,PHARMD AT 1013 08/15/16 BY L BENFIELD    Streptococcus species NOT DETECTED NOT DETECTED Final   Streptococcus agalactiae NOT DETECTED NOT DETECTED Final   Streptococcus pneumoniae NOT DETECTED NOT DETECTED Final   Streptococcus pyogenes NOT DETECTED NOT DETECTED Final   Acinetobacter baumannii NOT DETECTED NOT DETECTED Final   Enterobacteriaceae species NOT DETECTED NOT DETECTED Final   Enterobacter cloacae complex NOT DETECTED NOT DETECTED Final   Escherichia coli NOT DETECTED NOT DETECTED Final   Klebsiella oxytoca NOT DETECTED NOT DETECTED Final   Klebsiella pneumoniae NOT DETECTED NOT DETECTED Final   Proteus species NOT DETECTED NOT DETECTED Final   Serratia marcescens NOT DETECTED NOT DETECTED Final   Haemophilus influenzae NOT DETECTED NOT DETECTED Final   Neisseria meningitidis NOT DETECTED NOT DETECTED Final   Pseudomonas aeruginosa NOT DETECTED NOT DETECTED Final   Candida albicans NOT DETECTED NOT DETECTED Final   Candida glabrata NOT DETECTED NOT DETECTED Final   Candida krusei NOT DETECTED NOT DETECTED Final   Candida parapsilosis NOT DETECTED NOT DETECTED Final   Candida tropicalis NOT DETECTED NOT DETECTED Final  Culture, blood (Routine X 2) w Reflex to ID Panel     Status: Abnormal   Collection Time: 08/14/16  1:55 PM  Result Value Ref Range Status   Specimen Description BLOOD BLOOD LEFT HAND  Final   Special Requests BOTTLES DRAWN AEROBIC AND ANAEROBIC 5CC  Final   Culture  Setup Time   Final    GRAM POSITIVE COCCI IN CLUSTERS IN BOTH AEROBIC AND ANAEROBIC BOTTLES CRITICAL RESULT CALLED TO,  READ BACK BY AND VERIFIED WITH: E MARTIN,PHARMD AT 1013 08/15/16 BY L BENFIELD    Culture (A)  Final    STAPHYLOCOCCUS AUREUS SUSCEPTIBILITIES PERFORMED ON PREVIOUS CULTURE WITHIN THE LAST 5 DAYS.    Report Status 08/17/2016 FINAL  Final  Culture, blood (Routine X 2) w Reflex to ID Panel     Status: None (Preliminary result)   Collection Time: 08/15/16  2:14 PM  Result Value Ref Range Status   Specimen Description BLOOD RIGHT ANTECUBITAL  Final   Special Requests BOTTLES  DRAWN AEROBIC AND ANAEROBIC 10 CC EACH  Final   Culture NO GROWTH 2 DAYS  Final   Report Status PENDING  Incomplete      Radiology Studies: No results found.    Scheduled Meds: . amiodarone  100 mg Oral Daily  . amLODipine  5 mg Oral Daily  . carvedilol  25 mg Oral BID WC  . collagenase   Topical Daily  . feeding supplement (ENSURE ENLIVE)  237 mL Oral BID BM  . hydrALAZINE  50 mg Oral Q8H  . insulin aspart  0-15 Units Subcutaneous TID WC  . isosorbide mononitrate  120 mg Oral Daily  . levothyroxine  50 mcg Oral QAC breakfast  . nutrition supplement (JUVEN)  1 packet Oral BID WC  . sodium chloride flush  3 mL Intravenous Q12H  . tamsulosin  0.4 mg Oral QPC supper  . vancomycin  125 mg Oral QID  . vancomycin  1,000 mg Intravenous Q48H  . Warfarin - Pharmacist Dosing Inpatient   Does not apply q1800   Continuous Infusions:    LOS: 6 days   Time spent: 20 minutes   Faye Ramsay, MD [facility] Pager [phone]  If 7PM-7AM, please contact night-coverage [email or web address] Password [email or web address] 08/17/2016, 5:14 PM

## 2016-08-17 NOTE — Progress Notes (Signed)
Renal US- ordered and transport is on the way.

## 2016-08-17 NOTE — Progress Notes (Signed)
Pt did not void during the night, bladder scan completed, 326 mls of urine noted, will report off to on-coming nurse.

## 2016-08-17 NOTE — Consult Note (Signed)
[place] Nurse wound consult note Reason for Consult: Consult requested for BLE and sacrum. Wound type: Sacrum with unstageable pressure injury; 6X6cm, 10% red, 90% tightly adhered yellow slough.  Mod amt tan drainage, some strong odor.  Red moist skin surrounding the location with patchy areas of partial thickness skin loss.  Pt is frequently incontinent and it is difficult to keep the wound from becoming soiled. Right outer ankle with previously noted unstageable pressure injury; it has evolved into stage 2 pressure injury during this assessment.  .2X.2X.1cm, pink and dry, no odor or drainage Right heel with clear fluid filled intact blister; stage 2 pressure injury; 1X2cm Left outer heel with the same appearance; .5X3cm Pressure Injury POA: Yes Dressing procedure/placement/frequency: Pt has been assessed by the palliative care team and has comfort care goals and is awaiting Hospice placement, according to the EMR.  No family present to discuss the plan of care.  Santyl ointment for enzymatic debridement of nonviable tissue to sacrum.   Float heels and air mattress to reduce pressure, foam dresssings to protect bilat heels and right ankle from further injury. Please re-consult if further assistance is needed.  Thank-you,  Julien Girt MSN, [place], [place], [place], [place]

## 2016-08-17 NOTE — Progress Notes (Signed)
ANTICOAGULATION CONSULT NOTE - Follow Up Consult  Pharmacy Consult for Coumadin Indication: afib and hx of DVT  Allergies  Allergen Reactions  . Nsaids Nausea Only    Reaction:  GI upset   . Ibuprofen Other (See Comments)    Reaction:  GI upset   . Ace Inhibitors Cough    Patient Measurements: Height: 5\' 10"  (177.8 cm) Weight: 203 lb 14.4 oz (92.5 kg) IBW/kg (Calculated) : 73   Vital Signs: Temp: 99 F (37.2 C) (02/06 0532) Temp Source: Oral (02/06 0532) BP: 110/59 (02/06 0532) Pulse Rate: 84 (02/06 0532)  Labs:  Recent Labs  08/14/16 1356 08/15/16 0439 08/16/16 0322 08/17/16 1002  HGB 7.4* 6.8* 8.2* 7.9*  HCT 22.7* 21.2* 24.8* 24.0*  PLT 272 235 233 273  LABPROT 31.5*  --  49.7* 57.9*  INR 2.97  --  5.25* 6.34*  CREATININE 3.01* 2.93* 3.29* 3.81*    Estimated Creatinine Clearance: 18.3 mL/min (by C-G formula based on SCr of 3.81 mg/dL (H)).  Assessment: [name] continues on coumadin for afib and hx DVT.  INR elevated today at 6.34 , most likely due to Flagyl interaction / diarrhea No bleeding noted  PTA dose: 5mg  daily except 6mg  on Sun  Goal of Therapy:  INR 2-3 Monitor platelets by anticoagulation protocol: Yes   Plan:  No Coumadin today Daily INR  Thank you Anette Guarneri, PharmD [phone] 08/17/16 1:02 PM

## 2016-08-17 NOTE — Progress Notes (Signed)
Pt. Is sleepy with stable blood sugar, Refused to eat but drank milk. Vitals stable no distress, Labs are waiting to be drawn, Called lab and someone will be up ASAP.

## 2016-08-18 ENCOUNTER — Encounter: Payer: Self-pay | Admitting: Interventional Cardiology

## 2016-08-18 DIAGNOSIS — Z95828 Presence of other vascular implants and grafts: Secondary | ICD-10-CM

## 2016-08-18 LAB — GLUCOSE, CAPILLARY
GLUCOSE-CAPILLARY: 108 mg/dL — AB (ref 65–99)
GLUCOSE-CAPILLARY: 105 mg/dL — AB (ref 65–99)
Glucose-Capillary: 127 mg/dL — ABNORMAL HIGH (ref 65–99)
Glucose-Capillary: 123 mg/dL — ABNORMAL HIGH (ref 65–99)

## 2016-08-18 LAB — HCV COMMENT:

## 2016-08-18 LAB — PROTIME-INR
INR: 5.83 — AB
Prothrombin Time: 54.1 seconds — ABNORMAL HIGH (ref 11.4–15.2)

## 2016-08-18 LAB — BASIC METABOLIC PANEL
Anion gap: 13 (ref 5–15)
BUN: 130 mg/dL — AB (ref 6–20)
CHLORIDE: 103 mmol/L (ref 101–111)
CO2: 19 mmol/L — ABNORMAL LOW (ref 22–32)
Calcium: 8.3 mg/dL — ABNORMAL LOW (ref 8.9–10.3)
Creatinine, Ser: 4.19 mg/dL — ABNORMAL HIGH (ref 0.61–1.24)
GFR calc Af Amer: 14 mL/min — ABNORMAL LOW (ref >60)
GFR calc non Af Amer: 12 mL/min — ABNORMAL LOW (ref >60)
Glucose, Bld: 125 mg/dL — ABNORMAL HIGH (ref 65–99)
POTASSIUM: 4.4 mmol/L (ref 3.5–5.1)
SODIUM: 135 mmol/L (ref 135–145)

## 2016-08-18 LAB — CBC
HCT: 22.5 % — ABNORMAL LOW (ref 39.0–52.0)
HEMOGLOBIN: 7.5 g/dL — AB (ref 13.0–17.0)
MCH: 26.9 pg (ref 26.0–34.0)
MCHC: 33.3 g/dL (ref 30.0–36.0)
MCV: 80.6 fL (ref 78.0–100.0)
Platelets: 283 10*3/uL (ref 150–400)
RBC: 2.79 MIL/uL — AB (ref 4.22–5.81)
RDW: 18.1 % — ABNORMAL HIGH (ref 11.5–15.5)
WBC: 20.3 10*3/uL — ABNORMAL HIGH (ref 4.0–10.5)

## 2016-08-18 LAB — HEPATITIS C ANTIBODY (REFLEX): HCV AB: 0.2 {s_co_ratio} (ref 0.0–0.9)

## 2016-08-18 LAB — HIV ANTIBODY (ROUTINE TESTING W REFLEX): HIV Screen 4th Generation wRfx: NONREACTIVE

## 2016-08-18 LAB — MAGNESIUM: MAGNESIUM: 1.6 mg/dL — AB (ref 1.7–2.4)

## 2016-08-18 LAB — HEPATITIS B SURFACE ANTIGEN: Hepatitis B Surface Ag: NEGATIVE

## 2016-08-18 MED ORDER — MAGNESIUM SULFATE 2 GM/50ML IV SOLN
2.0000 g | Freq: Once | INTRAVENOUS | Status: AC
  Administered 2016-08-18: 2 g via INTRAVENOUS
  Filled 2016-08-18: qty 50

## 2016-08-18 MED ORDER — SODIUM CHLORIDE 0.9 % IV SOLN: 8.0000 mg/kg | INTRAVENOUS | Status: DC

## 2016-08-18 MED ORDER — DAPTOMYCIN 500 MG IV SOLR
750.0000 mg | INTRAVENOUS | Status: DC
  Filled 2016-08-18: qty 15

## 2016-08-18 MED ORDER — IPRATROPIUM-ALBUTEROL 0.5-2.5 (3) MG/3ML IN SOLN: 3.0000 mL | Freq: Two times a day (BID) | RESPIRATORY_TRACT | Status: DC

## 2016-08-18 MED ORDER — FUROSEMIDE 10 MG/ML IJ SOLN
40.0000 mg | Freq: Two times a day (BID) | INTRAMUSCULAR | Status: DC
  Administered 2016-08-18 – 2016-08-19 (×3): 40 mg via INTRAVENOUS
  Filled 2016-08-18 (×3): qty 4

## 2016-08-18 MED ORDER — SODIUM CHLORIDE 0.9 % IV SOLN: 750.0000 mg | INTRAVENOUS | Status: DC

## 2016-08-18 MED ORDER — CARVEDILOL 3.125 MG PO TABS
3.1250 mg | ORAL_TABLET | Freq: Two times a day (BID) | ORAL | Status: DC
  Administered 2016-08-18 – 2016-08-19 (×3): 3.125 mg via ORAL
  Filled 2016-08-18 (×3): qty 1

## 2016-08-18 NOTE — Progress Notes (Signed)
Pharmacy Antibiotic Note  [name]. is a 81 y.o. male admitted on 08/11/2016 with bacteremia.   Continues on IV vancomycin for MRSA bacteremia Repeat cultures from 2/4 NGTD  Renal function continues to worsen, no UOP, vancomycin dose adjusted  Plan: Continue Vancomycin 1 gram iv Q 48 hours Continue to follow  Height: 5\' 10"  (177.8 cm) Weight: 207 lb 4.8 oz (94 kg) IBW/kg (Calculated) : 73  Temp (24hrs), Avg:98.3 F (36.8 C), Min:98.2 F (36.8 C), Max:98.4 F (36.9 C)   Recent Labs Lab 08/14/16 1356 08/15/16 0439 08/16/16 0322 08/17/16 1002 08/18/16 0538  WBC 20.6* 15.1* 21.7* 22.6* 20.3*  CREATININE 3.01* 2.93* 3.29* 3.81* 4.19*  LATICACIDVEN 1.8  --   --   --   --     Estimated Creatinine Clearance: 16.7 mL/min (by C-G formula based on SCr of 4.19 mg/dL (H)).    Allergies  Allergen Reactions  . Nsaids Nausea Only    Reaction:  GI upset   . Ibuprofen Other (See Comments)    Reaction:  GI upset   . Ace Inhibitors Cough     Thank you for allowing pharmacy to be a part of this patient's care. Anette Guarneri, PharmD [phone]  08/18/2016 8:45 AM

## 2016-08-18 NOTE — Progress Notes (Signed)
B/P = 104/62 and pulse = 78.

## 2016-08-18 NOTE — Progress Notes (Signed)
Daily Progress Note   Patient Name: [name] AGUINALDO Sr.       Date: 08/18/2016 DOB: 03-31-1936  Age: 81 y.o. MRN#: [id number] Attending Physician: Caren Griffins, MD Primary Care Physician: [name], MD Admit Date: 08/11/2016  Reason for Consultation/Follow-up: Disposition and Psychosocial/spiritual support  Subjective: [name] appears to be worsening. His lethargy remains, and he is now minimally eating (only a few sips of apple juice today). He is also harder to arouse and endorsing increased generalize fatigue and weakness.   Length of Stay: 7  Current Medications: Scheduled Meds:  . amiodarone  100 mg Oral Daily  . carvedilol  3.125 mg Oral BID WC  . collagenase   Topical Daily  . feeding supplement (ENSURE ENLIVE)  237 mL Oral BID BM  . furosemide  40 mg Intravenous Q12H  . insulin aspart  0-15 Units Subcutaneous TID WC  . ipratropium-albuterol  3 mL Nebulization BID  . isosorbide mononitrate  120 mg Oral Daily  . levothyroxine  50 mcg Oral QAC breakfast  . nutrition supplement (JUVEN)  1 packet Oral BID WC  . sodium chloride flush  3 mL Intravenous Q12H  . tamsulosin  0.4 mg Oral QPC supper  . vancomycin  125 mg Oral QID  . vancomycin  1,000 mg Intravenous Q48H  . Warfarin - Pharmacist Dosing Inpatient   Does not apply q1800    Continuous Infusions:  PRN Meds: sodium chloride, acetaminophen, albuterol, HYDROmorphone (DILAUDID) injection, metoprolol, ondansetron (ZOFRAN) IV, sodium chloride flush  Physical Exam    Constitutional: Vital signs are normal. He appears lethargic. He has a sickly appearance.  Frail man lying in bed  HENT:  Head: Normocephalic and atraumatic.  Mouth/Throat: Mucous membranes are dry. No oropharyngeal exudate.  Eyes: EOM are normal.  Neck: Normal range of motion.    Pulmonary/Chest: Accessory muscle usage present.  Dyspnea at rest, requires repeated breaks during brief conversation to catch breath. Musculoskeletal:  Generalized weakness  Neurological: He appears lethargic.  Fully oriented, just slow to respond and repeat questions cues now required Psychiatric: He has a normal mood and affect. Judgment and thought content normal. His speech is delayed. He is slowed and withdrawn. Cognition and memory are normal.        Vital Signs: BP (!) 87/51 (BP Location: Right Arm) Comment: notfied nurse  Pulse 71   Temp 98.4 F (36.9 C) (Oral)   Resp 16   Ht _0  (1.778 m)   Wt 94 kg (207 lb 4.8 oz)   SpO2 98%   BMI 29.74 kg/m  SpO2: SpO2: 98 % O2 Device: O2 Device: Nasal Cannula O2 Flow Rate: O2 Flow Rate (L/min): 3 L/min  Intake/output summary:   Intake/Output Summary (Last 24 hours) at 08/18/16 1330 Last data filed at 08/18/16 0910  Gross per 24 hour  Intake              480 ml  Output                0 ml  Net              480 ml   LBM: Last BM Date: 08/18/16 Baseline Weight: Weight: 90.7  kg (200 lb) Most recent weight: Weight: 94 kg (207 lb 4.8 oz)  Palliative Assessment/Data: PPS 20-30%   Flowsheet Rows   Flowsheet Row Most Recent Value  Intake Tab  Referral Department  Cardiology  Unit at Time of Referral  Cardiac/Telemetry Unit  Palliative Care Primary Diagnosis  Cardiac  Date Notified  08/11/16  Palliative Care Type  Return patient Palliative Care  Reason for referral  Clarify Goals of Care  Date of Admission  08/11/16  # of days IP prior to Palliative referral  0  Clinical Assessment  Psychosocial & Spiritual Assessment  Palliative Care Outcomes      Patient Active Problem List   Diagnosis Date Noted  . Shortness of breath   . DNR (do not resuscitate)   . MRSA infection   . AKI (acute kidney injury) ([place])   . Malnutrition of moderate degree 08/13/2016  . Palliative care by specialist   . Failure to thrive (0-17)  08/11/2016  . Serratia marcescens infection ([place]) 07/31/2016  . Foreskin does not retract 07/31/2016  . Ulcer of urethral meatus 07/31/2016  . Hyperlipidemia 07/06/2016  . BPH (benign prostatic hyperplasia) 07/06/2016  . Acute back pain 06/24/2016  . Acute on chronic diastolic CHF (congestive heart failure) ([place]) 06/24/2016  . Debility 06/24/2016  . Leg weakness, bilateral 06/24/2016  . Pseudomonas urinary tract infection 06/24/2016  . Fall 06/24/2016  . Warfarin anticoagulation 06/24/2016  . Encounter for therapeutic drug monitoring 06/07/2016  . Pressure injury of skin 05/27/2016  . Acute deep vein thrombosis (DVT) of right upper extremity ([place]) 05/25/2016  . History of pacemaker 05/03/2016  . Anemia, iron deficiency 04/16/2016  . Endocarditis of tricuspid valve   . C. difficile colitis 04/10/2016  . HCAP (healthcare-associated pneumonia) 04/08/2016  . Chronic kidney disease, stage III (moderate) 03/09/2016  . Pressure ulcer 02/23/2016  . Seizure ([place]) 02/20/2016  . Palliative care encounter   . Goals of care, counseling/discussion   . Pacemaker infection ([place])   . Acute renal failure superimposed on stage 4 chronic kidney disease ([place]) 01/26/2016  . MRSA bacteremia   . Pseudogout involving multiple joints 01/24/2016  . Bladder mass 01/24/2016  . Hemoptysis   . Pain   . Left upper extremity swelling   . Staphylococcus aureus bacteremia 01/17/2016  . CAP (community acquired pneumonia) 01/16/2016  . S/P hernia repair 10/28/2015  . Right inguinal hernia 09/09/2015  . GIB (gastrointestinal bleeding) 08/29/2015  . Hematuria 05/19/2015  . Thrombocytopenia ([place]) 11/11/2014  . Leukopenia 11/11/2014  . B12 deficiency 08/31/2013  . Pacemaker 08/26/2013  . On amiodarone therapy 08/21/2013  . Gout due to renal impairment, left ankle and foot 06/13/2013    Class: Acute  . CAD of autologous bypass graft 06/05/2013  . MDS (myelodysplastic syndrome) ([place]) 05/22/2013  . Anemia of  chronic disease 03/19/2013  . CKD (chronic kidney disease), stage IV ([place]) 03/18/2013  . NSVT (nonsustained ventricular tachycardia) ([place]) 03/18/2013  . Chronic diastolic heart failure ([place]) 03/17/2013  . Diabetes mellitus ([place]) 03/17/2013  . Essential hypertension 03/17/2013  . Mesenteric artery stenosis ([place]) 05/01/2012  . PAF (paroxysmal atrial fibrillation) ([place]) 12/24/2011  . PAD (peripheral artery disease) ([place]) 12/24/2011  . Chronic vascular insufficiency of intestine (H[facility]) 12/20/2011  . Personal history of DVT (deep vein thrombosis) 07/12/2009    Palliative Care Assessment & Plan   HPI: 81 y.o. male  with past medical history of dCHF, bradycardia with prior PPM placement now removed post endocarditis, HTN, PAF on coumadin, CKD, recurrent  C. Diff, DVT in RUE, hypothyroidism, and failure to thrive who presented to his cardiologist office for follow-up after they increased his diuretic and was found to be extremely weak, decreased alertness, and poor oral intake. He was subsequently admitted on 08/11/2016 for failure to thrive. The plan with admission is to attempt to get him feeling better, as well as facilitate a meeting with palliative care to discuss goals of care.  Assessment: I met with Mr. Pulse and extended family on 2/2. Please see my initial consult note for full details of that conversation. In brief, Mr. Kampf elected to pursue comfort care with Hospice. After evaluation and a follow-up conversation with [facility], they did not feel he meet residential hospice criteria. Of note, the main reason for this was improvement in his oral intake since hospitalization and ability to take oral medication. Prior to admission he was eating minimally (half a meal per day, per pt), however his appetite has improved since being here and he is eating the majority of breakfast and lunch now. SW was able to determine that he could have Hospice at his SNF for no change in cost. I  spoke with his daughter on 2/2 in the evening to relate this, which she was in agreement with.   Unfortunately, prior to discharge he developed a fever. Work-up revealed sepsis with MRSA UTI and MRSA bacteremia, he also has known recurrent C. Diff. Hospitalist service is now primary (had been cardiology), with input from infectious disease. I had met with pt and family on 2/3 and discussed concern for infection (prior to cultures returning), and the decision was made to medically optimize (i.e investigate and treat) prior to discharge. Palliative also followed-up on 2/5 to support family in ongoing goals of care conversations, and DNR status and plan to treat what is treatable re-confirmed.   Despite interventions to medically optimize, he is looking worse each day that I see him. Today, he is markedly more lethargic, has only had a few sips of apple juice, and endorses full body fatigue and weakness. I expressed my concern that this infection in the setting of generalized debility does not seem to be improving and he appears to be transitioning towards death. He and his wife acknowledged this. I discussed two options with them, one is to contact hospice today and seek residential hospice placement, the other is to re-evaluate tomorrow (i.e give it one more day) and see if there is any improvement. After consideration, both he and his wife asked to give it one more day. I will plan to meet with them (and other family if they are able to be there) tomorrow around 1pm.   Recommendations/Plan:  DNR, continue current level of supportive care, I will plan to meet with family tomorrow afternoon to re-evaluate. If he has not improved, the plan will be to transition to residential hospice.    Symptoms:  Pain/dyspnea: PRN IV dilaudid (avoid morphine d/t renal fxn and risk for toxic metabolite build-up)  Wheezing: PRN duo-nebs and albuterol  Diarrhea: Known C. Diff with ongoing treatment  Goals of Care and  Additional Recommendations: Mr. Pursel asked for an additional day to see if he improves, which I felt was a reasonable request. I will meet with him and family tomorrow to re-evaluate.   Code Status:  DNR  Prognosis:   < 2 weeks; Mr. Mosqueda is quickly deteriorating. He is no longer eating and has had only a few sips of liquids. He is lethargic  and feels weak. He also has confirmed MRSA bacteremia with ongoing instability in his vitals and lab abnormalities.   Discharge Planning:  To Be Determined  Care plan was discussed with pt, wife, primary team.  Thank you for allowing the Palliative Medicine Team to assist in the care of this patient.  Total time: 35 minutes    Greater than 50%  of this time was spent counseling and coordinating care related to the above assessment and plan.  Charlynn Court, NP Palliative Medicine Team [phone] pager (7a-5p) Team Phone # [phone]

## 2016-08-18 NOTE — Telephone Encounter (Signed)
This encounter was created in error - please disregard.

## 2016-08-18 NOTE — Progress Notes (Signed)
ANTIBIOTIC CONSULT NOTE   Pharmacy Consult for Daptomycin Indication: bacteremia  Allergies  Allergen Reactions  . Nsaids Nausea Only    Reaction:  GI upset   . Ibuprofen Other (See Comments)    Reaction:  GI upset   . Ace Inhibitors Cough    Patient Measurements: Height: 5\' 10"  (177.8 cm) Weight: 207 lb 4.8 oz (94 kg) IBW/kg (Calculated) : 73  Vital Signs: Temp: 98.4 F (36.9 C) (02/07 1205) Temp Source: Oral (02/07 1205) BP: 87/51 (02/07 1205) Pulse Rate: 71 (02/07 1205) Intake/Output from previous day: 02/06 0701 - 02/07 0700 In: 600 [P.O.:600] Out: 0  Intake/Output from this shift: Total I/O In: 120 [P.O.:120] Out: -   Labs:  Recent Labs  08/16/16 0322 08/17/16 1002 08/18/16 0538  WBC 21.7* 22.6* 20.3*  HGB 8.2* 7.9* 7.5*  PLT 233 273 283  CREATININE 3.29* 3.81* 4.19*   Estimated Creatinine Clearance: 16.7 mL/min (by C-G formula based on SCr of 4.19 mg/dL (H)). No results for input(s): VANCOTROUGH, VANCOPEAK, VANCORANDOM, GENTTROUGH, GENTPEAK, GENTRANDOM, TOBRATROUGH, TOBRAPEAK, TOBRARND, AMIKACINPEAK, AMIKACINTROU, AMIKACIN in the last 72 hours.   Microbiology: Recent Results (from the past 720 hour(s))  MRSA PCR Screening     Status: Abnormal   Collection Time: 08/11/16  9:25 PM  Result Value Ref Range Status   MRSA by PCR POSITIVE (A) NEGATIVE Final    Comment:        The GeneXpert MRSA Assay (FDA approved for NASAL specimens only), is one component of a comprehensive MRSA colonization surveillance program. It is not intended to diagnose MRSA infection nor to guide or monitor treatment for MRSA infections. RESULT CALLED TO, READ BACK BY AND VERIFIED WITH: E.GARNETT,RN AT 2329 BY L.PITT 08/11/16   C difficile quick scan w PCR reflex     Status: Abnormal   Collection Time: 08/12/16  5:52 PM  Result Value Ref Range Status   C Diff antigen POSITIVE (A) NEGATIVE Final   C Diff toxin POSITIVE (A) NEGATIVE Final   C Diff interpretation Toxin  producing C. difficile detected.  Final    Comment: CRITICAL RESULT CALLED TO, READ BACK BY AND VERIFIED WITH: Nilda Riggs RN [id number] 08/12/16 A BROWNING   Culture, Urine     Status: Abnormal   Collection Time: 08/14/16 12:23 PM  Result Value Ref Range Status   Specimen Description URINE, RANDOM  Final   Special Requests Oral vanc  Final   Culture (A)  Final    >=100,000 COLONIES/mL METHICILLIN RESISTANT STAPHYLOCOCCUS AUREUS   Report Status 08/16/2016 FINAL  Final   Organism ID, Bacteria METHICILLIN RESISTANT STAPHYLOCOCCUS AUREUS (A)  Final      Susceptibility   Methicillin resistant staphylococcus aureus - MIC*    CIPROFLOXACIN >=8 RESISTANT Resistant     GENTAMICIN <=0.5 SENSITIVE Sensitive     NITROFURANTOIN <=16 SENSITIVE Sensitive     OXACILLIN >=4 RESISTANT Resistant     TETRACYCLINE <=1 SENSITIVE Sensitive     VANCOMYCIN 1 SENSITIVE Sensitive     TRIMETH/SULFA >=320 RESISTANT Resistant     CLINDAMYCIN <=0.25 SENSITIVE Sensitive     RIFAMPIN <=0.5 SENSITIVE Sensitive     Inducible Clindamycin NEGATIVE Sensitive     * >=100,000 COLONIES/mL METHICILLIN RESISTANT STAPHYLOCOCCUS AUREUS  Respiratory Panel by PCR     Status: None   Collection Time: 08/14/16 12:23 PM  Result Value Ref Range Status   Adenovirus NOT DETECTED NOT DETECTED Final   Coronavirus 229E NOT DETECTED NOT DETECTED Final   Coronavirus  HKU1 NOT DETECTED NOT DETECTED Final   Coronavirus NL63 NOT DETECTED NOT DETECTED Final   Coronavirus OC43 NOT DETECTED NOT DETECTED Final   Metapneumovirus NOT DETECTED NOT DETECTED Final   Rhinovirus / Enterovirus NOT DETECTED NOT DETECTED Final   Influenza A NOT DETECTED NOT DETECTED Final   Influenza B NOT DETECTED NOT DETECTED Final   Parainfluenza Virus 1 NOT DETECTED NOT DETECTED Final   Parainfluenza Virus 2 NOT DETECTED NOT DETECTED Final   Parainfluenza Virus 3 NOT DETECTED NOT DETECTED Final   Parainfluenza Virus 4 NOT DETECTED NOT DETECTED Final    Respiratory Syncytial Virus NOT DETECTED NOT DETECTED Final   Bordetella pertussis NOT DETECTED NOT DETECTED Final   Chlamydophila pneumoniae NOT DETECTED NOT DETECTED Final   Mycoplasma pneumoniae NOT DETECTED NOT DETECTED Final  Culture, blood (Routine X 2) w Reflex to ID Panel     Status: Abnormal   Collection Time: 08/14/16  1:45 PM  Result Value Ref Range Status   Specimen Description BLOOD RIGHT ANTECUBITAL  Final   Special Requests BOTTLES DRAWN AEROBIC AND ANAEROBIC 5CC  Final   Culture  Setup Time   Final    GRAM POSITIVE COCCI IN CLUSTERS IN BOTH AEROBIC AND ANAEROBIC BOTTLES CRITICAL RESULT CALLED TO, READ BACK BY AND VERIFIED WITH: E MARTIN,PHARMD AT 1013 08/15/16 BY L BENFIELD    Culture METHICILLIN RESISTANT STAPHYLOCOCCUS AUREUS (A)  Final   Report Status 08/17/2016 FINAL  Final   Organism ID, Bacteria METHICILLIN RESISTANT STAPHYLOCOCCUS AUREUS  Final      Susceptibility   Methicillin resistant staphylococcus aureus - MIC*    CIPROFLOXACIN >=8 RESISTANT Resistant     ERYTHROMYCIN >=8 RESISTANT Resistant     GENTAMICIN <=0.5 SENSITIVE Sensitive     OXACILLIN >=4 RESISTANT Resistant     TETRACYCLINE <=1 SENSITIVE Sensitive     VANCOMYCIN <=0.5 SENSITIVE Sensitive     TRIMETH/SULFA >=320 RESISTANT Resistant     CLINDAMYCIN <=0.25 SENSITIVE Sensitive     RIFAMPIN <=0.5 SENSITIVE Sensitive     Inducible Clindamycin NEGATIVE Sensitive     * METHICILLIN RESISTANT STAPHYLOCOCCUS AUREUS  Blood Culture ID Panel (Reflexed)     Status: Abnormal   Collection Time: 08/14/16  1:45 PM  Result Value Ref Range Status   Enterococcus species NOT DETECTED NOT DETECTED Final   Listeria monocytogenes NOT DETECTED NOT DETECTED Final   Staphylococcus species DETECTED (A) NOT DETECTED Final    Comment: CRITICAL RESULT CALLED TO, READ BACK BY AND VERIFIED WITH: E MARTIN,PHARMD AT 1013 08/15/16 BY L BENFIELD    Staphylococcus aureus DETECTED (A) NOT DETECTED Final    Comment: Methicillin  (oxacillin)-resistant Staphylococcus aureus (MRSA). MRSA is predictably resistant to beta-lactam antibiotics (except ceftaroline). Preferred therapy is vancomycin unless clinically contraindicated. Patient requires contact precautions if  hospitalized. CRITICAL RESULT CALLED TO, READ BACK BY AND VERIFIED WITH: E MARTIN,PHARMD AT 1013 08/15/16 BY L BENFIELD    Methicillin resistance DETECTED (A) NOT DETECTED Final    Comment: CRITICAL RESULT CALLED TO, READ BACK BY AND VERIFIED WITH: E MARTIN,PHARMD AT 1013 08/15/16 BY L BENFIELD    Streptococcus species NOT DETECTED NOT DETECTED Final   Streptococcus agalactiae NOT DETECTED NOT DETECTED Final   Streptococcus pneumoniae NOT DETECTED NOT DETECTED Final   Streptococcus pyogenes NOT DETECTED NOT DETECTED Final   Acinetobacter baumannii NOT DETECTED NOT DETECTED Final   Enterobacteriaceae species NOT DETECTED NOT DETECTED Final   Enterobacter cloacae complex NOT DETECTED NOT DETECTED Final   Escherichia coli  NOT DETECTED NOT DETECTED Final   Klebsiella oxytoca NOT DETECTED NOT DETECTED Final   Klebsiella pneumoniae NOT DETECTED NOT DETECTED Final   Proteus species NOT DETECTED NOT DETECTED Final   Serratia marcescens NOT DETECTED NOT DETECTED Final   Haemophilus influenzae NOT DETECTED NOT DETECTED Final   Neisseria meningitidis NOT DETECTED NOT DETECTED Final   Pseudomonas aeruginosa NOT DETECTED NOT DETECTED Final   Candida albicans NOT DETECTED NOT DETECTED Final   Candida glabrata NOT DETECTED NOT DETECTED Final   Candida krusei NOT DETECTED NOT DETECTED Final   Candida parapsilosis NOT DETECTED NOT DETECTED Final   Candida tropicalis NOT DETECTED NOT DETECTED Final  Culture, blood (Routine X 2) w Reflex to ID Panel     Status: Abnormal   Collection Time: 08/14/16  1:55 PM  Result Value Ref Range Status   Specimen Description BLOOD BLOOD LEFT HAND  Final   Special Requests BOTTLES DRAWN AEROBIC AND ANAEROBIC 5CC  Final   Culture  Setup  Time   Final    GRAM POSITIVE COCCI IN CLUSTERS IN BOTH AEROBIC AND ANAEROBIC BOTTLES CRITICAL RESULT CALLED TO, READ BACK BY AND VERIFIED WITH: E MARTIN,PHARMD AT 1013 08/15/16 BY L BENFIELD    Culture (A)  Final    STAPHYLOCOCCUS AUREUS SUSCEPTIBILITIES PERFORMED ON PREVIOUS CULTURE WITHIN THE LAST 5 DAYS.    Report Status 08/17/2016 FINAL  Final  Culture, blood (Routine X 2) w Reflex to ID Panel     Status: None (Preliminary result)   Collection Time: 08/15/16  2:14 PM  Result Value Ref Range Status   Specimen Description BLOOD RIGHT ANTECUBITAL  Final   Special Requests BOTTLES DRAWN AEROBIC AND ANAEROBIC 10 CC EACH  Final   Culture NO GROWTH 2 DAYS  Final   Report Status PENDING  Incomplete    Medical History: Past Medical History:  Diagnosis Date  . Angiomyolipoma 2009   On both kidneys noted in 2009  . Arthritis    neck and left wrist  . Atrial fibrillation ([place])   . CAD (coronary artery disease)    s/b CABG 1994, and subsequent stents. Repeat CABG 12/2011,  . Chronic diastolic heart failure ([place])   . CKD (chronic kidney disease)   . Clostridium difficile diarrhea 03/2016  . Depressive disorder   . Diabetes mellitus type 2 with peripheral artery disease ([facility])    DIET CONTROLLED  . DVT (deep venous thrombosis) ([place]) 2011   Right arm  . Gastroparesis   . GERD (gastroesophageal reflux disease)   . Gout   . History of hiatal hernia   . Hyperlipidemia   . Hypertension   . Internal hemorrhoids without mention of complication   . Ischemic colitis ([place])   . 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 Myelodysplastic syndrome ([place]) 05/22/2013   With low hemoglobin and platelets treated with Procrit  . Osteopenia   . Peptic ulcer    S/p partial gastrectomy in 1969  . Peripheral artery disease ([place])   . Pneumonia 01/16/2016  . Pneumonia 03/2016  . Presence of permanent cardiac pacemaker   . Prostate cancer ([place]) 1997   XRT and lupron  . Renal artery stenosis ([place])   . Sick sinus  syndrome ([place])   . Vitamin B 12 deficiency      Assessment: [name] is a 81 yo male admitted 08/11/2016 with bacteremia. Today is day 4 of therapy for 2/2 MRSA bacteremia. Repeat blood culture is negative. WBC is stable over last few days, afebrile.  Patient with baseline renal dysfunction that has been worsening (creatinine 3.81>4.19 overnight). Plans to treat bacteremia for 4 weeks. Due to worsening renal function, will change to daptomycin per ID.  Last dose of vancomycin was yesterday at 10:55 am. Since on 48 hour regimen currently, next dose planned for tomorrow. No baseline CK established.   Plan:  Daptomycin 8mg /kg starting tomorrow at 1000 Follow-up baseline CK in morning Weekly CK Monitor renal function   Dierdre Harness, BS, PharmD Clinical Pharmacy Resident [phone] (Pager) 08/18/2016 2:40 PM

## 2016-08-18 NOTE — Progress Notes (Signed)
Patient's b/p = 87/51, pulse = 71. Patient sleeping and arousable.  Skin warm and dry.  Patient had Lasix 40mg  IV this morning and Dilaudid 0.5mg  IV this morning.  Dr. Cruzita Lederer informed and will be reviewing medications.

## 2016-08-18 NOTE — Progress Notes (Signed)
ANTICOAGULATION CONSULT NOTE - Follow Up Consult  Pharmacy Consult for Coumadin Indication: afib and hx of DVT  Allergies  Allergen Reactions  . Nsaids Nausea Only    Reaction:  GI upset   . Ibuprofen Other (See Comments)    Reaction:  GI upset   . Ace Inhibitors Cough    Patient Measurements: Height: 5\' 10"  (177.8 cm) Weight: 207 lb 4.8 oz (94 kg) IBW/kg (Calculated) : 73   Vital Signs: Temp: 98.4 F (36.9 C) (02/07 0630) Temp Source: Oral (02/07 0630) BP: 126/56 (02/07 0630) Pulse Rate: 85 (02/07 0630)  Labs:  Recent Labs  08/16/16 0322 08/17/16 1002 08/18/16 0538  HGB 8.2* 7.9* 7.5*  HCT 24.8* 24.0* 22.5*  PLT 233 273 283  LABPROT 49.7* 57.9* 54.1*  INR 5.25* 6.34* 5.83*  CREATININE 3.29* 3.81* 4.19*    Estimated Creatinine Clearance: 16.7 mL/min (by C-G formula based on SCr of 4.19 mg/dL (H)).  Assessment: 80yom continues on coumadin for afib and hx DVT.  INR elevated today at 5.83 (trending down) , most likely due to Flagyl interaction / diarrhea No bleeding noted  PTA dose: 5mg  daily except 6mg  on Sun  Goal of Therapy:  INR 2-3 Monitor platelets by anticoagulation protocol: Yes   Plan:  No Coumadin today Daily INR  Thank you Anette Guarneri, PharmD [phone] 08/18/16 8:44 AM

## 2016-08-18 NOTE — Progress Notes (Signed)
PROGRESS NOTE  [name] [id number] DOB: [birth year] DOA: 08/11/2016 PCP: [name], MD   LOS: 7 days   Brief Narrative: 81 y.o.malesignificant for atrial fibrillation, coronary artery disease, CK D, diabetes, gastroparesis, GERD, gout, hypertension. Patient was originally admitted by cardiology on the 31st for weakness and fatigue thought to be due to congestive heart failure. Patient had a fever overnight 102. Patient is still very tired and weak per nursing. No other acute events overnight.  Assessment & Plan: Active Problems:   Diabetes mellitus (HCC)   CKD (chronic kidney disease), stage IV (HCC)   Anemia of chronic disease   CAD of autologous bypass graft   Acute renal failure superimposed on stage 4 chronic kidney disease (HCC)   HCAP (healthcare-associated pneumonia)   C. difficile colitis   Acute on chronic diastolic CHF (congestive heart failure) (HCC)   Warfarin anticoagulation   Failure to thrive (0-17)   Malnutrition of moderate degree   Palliative care by specialist   AKI (acute kidney injury) ([place])   MRSA infection   DNR (do not resuscitate)   Shortness of breath  Sepsis secondary to MRSA bacteremia, Staph aureus UTI, C. Diff - started vancomycin IV 2/4, ID team consulted and assistance is appreciated  - zosyn stopped per ID team as no indication to continue and pt also with C. Diff - no plan to obtain TEE as pt with rather guarded prognosis and determined not to be a candidate for aggressive interventions  - repeat blood cultures done 2/4, so far no growth  - will need 4-6 weeks course of IV Vancomycin  - worrisome persistent rise in WBC, monitor closely  - CBC in AM  Recurrent C diff - per ID, stop IV Flagyl and continue only PO Vancomycin, will need two weeks therapy  - pt with ongoing diarrhea, WBC trending up  Staph aureus UTI - vancomycin IV already on board as noted above   Acute respiratory failure   - stable  Chronic  diastolic CHF - with persistent LE edema  - stopped IVF 2/4, lasix also held due to worsening Cr, up from 2.93 --> 3.29 --> 3.81 >> 4.2 - Resume Lasix today, he has pitting lower extremity edema. Decrease Coreg dose and stop Hydralazine to augment blood pressure - appreciate cardiology team following, cardiology singed off as no further recommendations   Hypotension - with SBP in 80s this AM - Reduce Coreg to lovastatin dose, stop hydralazine  CAD - S/p CABG and redo CABG. No chest pain.  - Trop mildly elevated w/ flat trend - Suspect demand ischemia in the setting of diast CHF/generalized illness - no aspirin as pt already on Coumadin per cardiology  - no plan for further ischemic work up   PAF, CHADS 2 score 5 - Cont amio/?blocker/coumadin - on tele monitor   Right Upper Ext DVT - On coumadin - INR 5.25 --> 6.34 >> 5.8 - currently no signs of active bleeding  Hypokalemia  - supplemented and WNL this AM  Hypomagnesemia - supplemented, requires more today   Normocytic anemia - Hg drop from 7.4 --> 6.8 -->  8.2 --> 7.9 - s/p 1 U PRBC transfusion 2/4 - CBC in AM  Hypothyroidism - TSH 32.676 on admission 1/31. This was up from 22.371 on 12/15 - currently on synthroid 75 mcg QD   Acute renal failure superimposed on stage 4 chronic kidney disease ([name]) - holding on IVF as pt with more LE edema - no lasix either  as Cr continues trending up - also worrisome for giving Vancomycin, will obtain renal US - paged ID to discuss vancomycin and possible change in ABX regimen given progressive renal injury - BMP in AM  Moderate PCM - appreciate nutritionist recommendations   Decubitus ulcer, sacral area, also bilateral heals - Sacrum with unstageable pressure injury; 6X6cm, 10% red, 90% tightly adhered yellow slough. - Pt is frequently incontinent and it is difficult to keep the wound from becoming soiled. - Right outer ankle with previously noted unstageable  pressure injury; it has evolved into stage 2 pressure injury during this assessment. .2X.2X.1cm, pink and dry, no odor or drainage - Right heel with clear fluid filled intact blister; stage 2 pressure injury; 1X2cm - Left outer heel with the same appearance; .5X3cm - awaiting Hospice placement - Santyl ointment for enzymatic debridement of nonviable tissue to sacrum. Float heels and air mattress to reduce pressure, foam dresssings to protect bilat heels and right ankle from further injury.  Acute functional quadriplegia - in the setting of multiple acute medical conditions and co morbidities - family hopeful for SNF discharge with hospice however, prognosis guarded  - pt is poorly responsive to current medical care, Cr trending up, vancomycin IV on board but worsening renal failure may require change in ABX, if we have to change to more broad ABX there is a high concern of C. Diff relapse when it has not gotten a change to be treated fully - will continue our effort in helping pt recover but as noted above prognosis is guarded. D/w son at bedside   DVT prophylaxis: Coumadin Code Status: DNR Family Communication: d/w son bedside Disposition Plan: TBD  Consultants:   Palliative  Cardiology   Procedures:   None   Antimicrobials:  Vancomycin IV 2/4   Vancomycin po 2/2 >>  Subjective: - Poorly responsive, alert and oriented 4 and open his eyes when asked however very lethargic  Objective: Vitals:   08/17/16 2056 08/18/16 0630 08/18/16 0911 08/18/16 1205  BP: (!) 113/57 (!) 126/56 (!) 106/50 (!) 87/51  Pulse: 90 85 77 71  Resp: 18 18 16 16   Temp: 98.2 F (36.8 C) 98.4 F (36.9 C) 98.6 F (37 C) 98.4 F (36.9 C)  TempSrc: Oral Oral  Oral  SpO2: 100% 99% 100% 98%  Weight:  94 kg (207 lb 4.8 oz)    Height:        Intake/Output Summary (Last 24 hours) at 08/18/16 1404 Last data filed at 08/18/16 0910  Gross per 24 hour  Intake              480 ml  Output                 0 ml  Net              480 ml   Filed Weights   08/16/16 0549 08/17/16 0532 08/18/16 0630  Weight: 86.2 kg (190 lb 1.6 oz) 92.5 kg (203 lb 14.4 oz) 94 kg (207 lb 4.8 oz)    Examination: Constitutional: lethargic  Vitals:   08/17/16 2056 08/18/16 0630 08/18/16 0911 08/18/16 1205  BP: (!) 113/57 (!) 126/56 (!) 106/50 (!) 87/51  Pulse: 90 85 77 71  Resp: 18 18 16 16   Temp: 98.2 F (36.8 C) 98.4 F (36.9 C) 98.6 F (37 C) 98.4 F (36.9 C)  TempSrc: Oral Oral  Oral  SpO2: 100% 99% 100% 98%  Weight:  94 kg (207 lb 4.8 oz)  Height:       Neck: normal, supple, no masses Respiratory: clear to auscultation bilaterally, no wheezing, no crackles. Poor respiratory effort Cardiovascular: Regular rate and rhythm, no murmurs / rubs / gallops. 2+ LE edema.  Abdomen: no tenderness. Bowel sounds positive.  Musculoskeletal: no clubbing / cyanosis.  Skin: no rashes, lesions, ulcers. No induration  Data Reviewed: I have personally reviewed following labs and imaging studies  CBC:  Recent Labs Lab 08/11/16 1850 08/14/16 1356 08/15/16 0439 08/16/16 0322 08/17/16 1002 08/18/16 0538  WBC 14.2* 20.6* 15.1* 21.7* 22.6* 20.3*  NEUTROABS 11.7*  --  13.6*  --   --   --   HGB 8.1* 7.4* 6.8* 8.2* 7.9* 7.5*  HCT 25.1* 22.7* 21.2* 24.8* 24.0* 22.5*  MCV 85.7 83.5 83.1 82.9 81.6 80.6  PLT 261 272 235 233 273 [id number]   Basic Metabolic Panel:  Recent Labs Lab 08/11/16 1850  08/14/16 1356 08/15/16 0439 08/16/16 0322 08/17/16 1002 08/18/16 0538  NA 136  < > 134* 134* 135 135 135  K 2.5*  < > 5.0 4.3 4.1 4.2 4.4  CL 100*  < > 104 103 104 102 103  CO2 23  < > 20* 20* 19* 19* 19*  GLUCOSE 171*  < > 234* 174* 133* 155* 125*  BUN 90*  < > 102* 106* 109* 127* 130*  CREATININE 3.27*  < > 3.01* 2.93* 3.29* 3.81* 4.19*  CALCIUM 8.0*  < > 8.2* 8.0* 8.1* 8.5* 8.3*  MG 1.3*  --  1.3* 1.3* 1.7  --  1.6*  PHOS  --   --  2.6 3.3  --   --   --   < > = values in this interval not  displayed. GFR: Estimated Creatinine Clearance: 16.7 mL/min (by C-G formula based on SCr of 4.19 mg/dL (H)). Liver Function Tests:  Recent Labs Lab 08/11/16 1850 08/14/16 1356  AST 41 73*  ALT 15* 25  ALKPHOS 55 65  BILITOT 0.3 0.7  PROT 7.0 7.1  ALBUMIN 1.8* 1.8*   No results for input(s): LIPASE, AMYLASE in the last 168 hours. No results for input(s): AMMONIA in the last 168 hours. Coagulation Profile:  Recent Labs Lab 08/13/16 0448 08/14/16 1356 08/16/16 0322 08/17/16 1002 08/18/16 0538  INR 2.92 2.97 5.25* 6.34* 5.83*   Cardiac Enzymes:  Recent Labs Lab 08/11/16 1850 08/12/16 0120 08/12/16 0704  TROPONINI 0.07* 0.06* 0.06*   BNP (last 3 results) No results for input(s): PROBNP in the last 8760 hours. HbA1C: No results for input(s): HGBA1C in the last 72 hours. CBG:  Recent Labs Lab 08/17/16 1140 08/17/16 1649 08/17/16 2225 08/18/16 0802 08/18/16 1203  GLUCAP 163* 157* 140* 127* 108*   Lipid Profile: No results for input(s): CHOL, HDL, LDLCALC, TRIG, CHOLHDL, LDLDIRECT in the last 72 hours. Thyroid Function Tests: No results for input(s): TSH, T4TOTAL, FREET4, T3FREE, THYROIDAB in the last 72 hours. Anemia Panel: No results for input(s): VITAMINB12, FOLATE, FERRITIN, TIBC, IRON, RETICCTPCT in the last 72 hours. Urine analysis:    Component Value Date/Time   COLORURINE YELLOW 08/11/2016 1556   APPEARANCEUR CLOUDY (A) 08/11/2016 1556   LABSPEC 1.010 08/11/2016 1556   LABSPEC 1.010 05/19/2015 1231   PHURINE 5.0 08/11/2016 1556   GLUCOSEU NEGATIVE 08/11/2016 1556   GLUCOSEU Negative 05/19/2015 1231   HGBUR MODERATE (A) 08/11/2016 1556   BILIRUBINUR NEGATIVE 08/11/2016 1556   BILIRUBINUR Negative 05/19/2015 1231   KETONESUR NEGATIVE 08/11/2016 1556   PROTEINUR 30 (A) 08/11/2016 1556  UROBILINOGEN 0.2 05/19/2015 1231   NITRITE NEGATIVE 08/11/2016 1556   LEUKOCYTESUR LARGE (A) 08/11/2016 1556   LEUKOCYTESUR Negative 05/19/2015 1231   Sepsis  Labs: Invalid input(s): PROCALCITONIN, LACTICIDVEN  Recent Results (from the past 240 hour(s))  MRSA PCR Screening     Status: Abnormal   Collection Time: 08/11/16  9:25 PM  Result Value Ref Range Status   MRSA by PCR POSITIVE (A) NEGATIVE Final    Comment:        The GeneXpert MRSA Assay (FDA approved for NASAL specimens only), is one component of a comprehensive MRSA colonization surveillance program. It is not intended to diagnose MRSA infection nor to guide or monitor treatment for MRSA infections. RESULT CALLED TO, READ BACK BY AND VERIFIED WITH: E.GARNETT,RN AT 2329 BY L.PITT 08/11/16   C difficile quick scan w PCR reflex     Status: Abnormal   Collection Time: 08/12/16  5:52 PM  Result Value Ref Range Status   C Diff antigen POSITIVE (A) NEGATIVE Final   C Diff toxin POSITIVE (A) NEGATIVE Final   C Diff interpretation Toxin producing C. difficile detected.  Final    Comment: CRITICAL RESULT CALLED TO, READ BACK BY AND VERIFIED WITH: Nilda Riggs RN [id number] 08/12/16 A BROWNING   Culture, Urine     Status: Abnormal   Collection Time: 08/14/16 12:23 PM  Result Value Ref Range Status   Specimen Description URINE, RANDOM  Final   Special Requests Oral vanc  Final   Culture (A)  Final    >=100,000 COLONIES/mL METHICILLIN RESISTANT STAPHYLOCOCCUS AUREUS   Report Status 08/16/2016 FINAL  Final   Organism ID, Bacteria METHICILLIN RESISTANT STAPHYLOCOCCUS AUREUS (A)  Final      Susceptibility   Methicillin resistant staphylococcus aureus - MIC*    CIPROFLOXACIN >=8 RESISTANT Resistant     GENTAMICIN <=0.5 SENSITIVE Sensitive     NITROFURANTOIN <=16 SENSITIVE Sensitive     OXACILLIN >=4 RESISTANT Resistant     TETRACYCLINE <=1 SENSITIVE Sensitive     VANCOMYCIN 1 SENSITIVE Sensitive     TRIMETH/SULFA >=320 RESISTANT Resistant     CLINDAMYCIN <=0.25 SENSITIVE Sensitive     RIFAMPIN <=0.5 SENSITIVE Sensitive     Inducible Clindamycin NEGATIVE Sensitive     * >=100,000  COLONIES/mL METHICILLIN RESISTANT STAPHYLOCOCCUS AUREUS  Respiratory Panel by PCR     Status: None   Collection Time: 08/14/16 12:23 PM  Result Value Ref Range Status   Adenovirus NOT DETECTED NOT DETECTED Final   Coronavirus 229E NOT DETECTED NOT DETECTED Final   Coronavirus HKU1 NOT DETECTED NOT DETECTED Final   Coronavirus NL63 NOT DETECTED NOT DETECTED Final   Coronavirus OC43 NOT DETECTED NOT DETECTED Final   Metapneumovirus NOT DETECTED NOT DETECTED Final   Rhinovirus / Enterovirus NOT DETECTED NOT DETECTED Final   Influenza A NOT DETECTED NOT DETECTED Final   Influenza B NOT DETECTED NOT DETECTED Final   Parainfluenza Virus 1 NOT DETECTED NOT DETECTED Final   Parainfluenza Virus 2 NOT DETECTED NOT DETECTED Final   Parainfluenza Virus 3 NOT DETECTED NOT DETECTED Final   Parainfluenza Virus 4 NOT DETECTED NOT DETECTED Final   Respiratory Syncytial Virus NOT DETECTED NOT DETECTED Final   Bordetella pertussis NOT DETECTED NOT DETECTED Final   Chlamydophila pneumoniae NOT DETECTED NOT DETECTED Final   Mycoplasma pneumoniae NOT DETECTED NOT DETECTED Final  Culture, blood (Routine X 2) w Reflex to ID Panel     Status: Abnormal   Collection Time: 08/14/16  1:45 PM  Result Value Ref Range Status   Specimen Description BLOOD RIGHT ANTECUBITAL  Final   Special Requests BOTTLES DRAWN AEROBIC AND ANAEROBIC 5CC  Final   Culture  Setup Time   Final    GRAM POSITIVE COCCI IN CLUSTERS IN BOTH AEROBIC AND ANAEROBIC BOTTLES CRITICAL RESULT CALLED TO, READ BACK BY AND VERIFIED WITH: E MARTIN,PHARMD AT 1013 08/15/16 BY L BENFIELD    Culture METHICILLIN RESISTANT STAPHYLOCOCCUS AUREUS (A)  Final   Report Status 08/17/2016 FINAL  Final   Organism ID, Bacteria METHICILLIN RESISTANT STAPHYLOCOCCUS AUREUS  Final      Susceptibility   Methicillin resistant staphylococcus aureus - MIC*    CIPROFLOXACIN >=8 RESISTANT Resistant     ERYTHROMYCIN >=8 RESISTANT Resistant     GENTAMICIN <=0.5 SENSITIVE  Sensitive     OXACILLIN >=4 RESISTANT Resistant     TETRACYCLINE <=1 SENSITIVE Sensitive     VANCOMYCIN <=0.5 SENSITIVE Sensitive     TRIMETH/SULFA >=320 RESISTANT Resistant     CLINDAMYCIN <=0.25 SENSITIVE Sensitive     RIFAMPIN <=0.5 SENSITIVE Sensitive     Inducible Clindamycin NEGATIVE Sensitive     * METHICILLIN RESISTANT STAPHYLOCOCCUS AUREUS  Blood Culture ID Panel (Reflexed)     Status: Abnormal   Collection Time: 08/14/16  1:45 PM  Result Value Ref Range Status   Enterococcus species NOT DETECTED NOT DETECTED Final   Listeria monocytogenes NOT DETECTED NOT DETECTED Final   Staphylococcus species DETECTED (A) NOT DETECTED Final    Comment: CRITICAL RESULT CALLED TO, READ BACK BY AND VERIFIED WITH: E MARTIN,PHARMD AT 1013 08/15/16 BY L BENFIELD    Staphylococcus aureus DETECTED (A) NOT DETECTED Final    Comment: Methicillin (oxacillin)-resistant Staphylococcus aureus (MRSA). MRSA is predictably resistant to beta-lactam antibiotics (except ceftaroline). Preferred therapy is vancomycin unless clinically contraindicated. Patient requires contact precautions if  hospitalized. CRITICAL RESULT CALLED TO, READ BACK BY AND VERIFIED WITH: E MARTIN,PHARMD AT 1013 08/15/16 BY L BENFIELD    Methicillin resistance DETECTED (A) NOT DETECTED Final    Comment: CRITICAL RESULT CALLED TO, READ BACK BY AND VERIFIED WITH: E MARTIN,PHARMD AT 1013 08/15/16 BY L BENFIELD    Streptococcus species NOT DETECTED NOT DETECTED Final   Streptococcus agalactiae NOT DETECTED NOT DETECTED Final   Streptococcus pneumoniae NOT DETECTED NOT DETECTED Final   Streptococcus pyogenes NOT DETECTED NOT DETECTED Final   Acinetobacter baumannii NOT DETECTED NOT DETECTED Final   Enterobacteriaceae species NOT DETECTED NOT DETECTED Final   Enterobacter cloacae complex NOT DETECTED NOT DETECTED Final   Escherichia coli NOT DETECTED NOT DETECTED Final   Klebsiella oxytoca NOT DETECTED NOT DETECTED Final   Klebsiella  pneumoniae NOT DETECTED NOT DETECTED Final   Proteus species NOT DETECTED NOT DETECTED Final   Serratia marcescens NOT DETECTED NOT DETECTED Final   Haemophilus influenzae NOT DETECTED NOT DETECTED Final   Neisseria meningitidis NOT DETECTED NOT DETECTED Final   Pseudomonas aeruginosa NOT DETECTED NOT DETECTED Final   Candida albicans NOT DETECTED NOT DETECTED Final   Candida glabrata NOT DETECTED NOT DETECTED Final   Candida krusei NOT DETECTED NOT DETECTED Final   Candida parapsilosis NOT DETECTED NOT DETECTED Final   Candida tropicalis NOT DETECTED NOT DETECTED Final  Culture, blood (Routine X 2) w Reflex to ID Panel     Status: Abnormal   Collection Time: 08/14/16  1:55 PM  Result Value Ref Range Status   Specimen Description BLOOD BLOOD LEFT HAND  Final   Special Requests BOTTLES DRAWN  AEROBIC AND ANAEROBIC 5CC  Final   Culture  Setup Time   Final    GRAM POSITIVE COCCI IN CLUSTERS IN BOTH AEROBIC AND ANAEROBIC BOTTLES CRITICAL RESULT CALLED TO, READ BACK BY AND VERIFIED WITH: E MARTIN,PHARMD AT 1013 08/15/16 BY L BENFIELD    Culture (A)  Final    STAPHYLOCOCCUS AUREUS SUSCEPTIBILITIES PERFORMED ON PREVIOUS CULTURE WITHIN THE LAST 5 DAYS.    Report Status 08/17/2016 FINAL  Final  Culture, blood (Routine X 2) w Reflex to ID Panel     Status: None (Preliminary result)   Collection Time: 08/15/16  2:14 PM  Result Value Ref Range Status   Specimen Description BLOOD RIGHT ANTECUBITAL  Final   Special Requests BOTTLES DRAWN AEROBIC AND ANAEROBIC 10 CC EACH  Final   Culture NO GROWTH 2 DAYS  Final   Report Status PENDING  Incomplete      Radiology Studies: Dg Chest 1 View  Result Date: 08/17/2016 CLINICAL DATA:  Acute renal failure EXAM: CHEST 1 VIEW COMPARISON:  08/14/2016 FINDINGS: Post sternotomy changes are again visualized. There is mild cardiomegaly. Development of small bilateral effusions and hazy bibasilar atelectasis or infiltrate. Atherosclerosis. No pneumothorax.  IMPRESSION: 1. Development of small bilateral pleural effusions with increased hazy bibasilar atelectasis or infiltrate 2. Cardiomegaly Electronically Signed   By: Donavan Foil M.D.   On: 08/17/2016 20:40   US Renal  Result Date: 08/17/2016 CLINICAL DATA:  Acute renal failure EXAM: RENAL / URINARY TRACT ULTRASOUND COMPLETE COMPARISON:  CT 08/14/2016, 06/30/2016 FINDINGS: Right Kidney: Length: 11.1 cm. Diffuse increased echogenicity. No hydronephrosis. The previously noted cyst is not well identified on the current exam Left Kidney: Length: 10.9 cm. Diffuse increased echogenicity. No hydronephrosis. Bladder: Appears normal for degree of bladder distention. Incidentally noted is a small amount of abdominal ascites IMPRESSION: 1. Echogenic kidneys consistent with medical renal disease. No hydronephrosis. 2. Small volume of abdominal ascites Electronically Signed   By: Donavan Foil M.D.   On: 08/17/2016 20:39     Scheduled Meds: . amiodarone  100 mg Oral Daily  . carvedilol  3.125 mg Oral BID WC  . collagenase   Topical Daily  . feeding supplement (ENSURE ENLIVE)  237 mL Oral BID BM  . furosemide  40 mg Intravenous Q12H  . insulin aspart  0-15 Units Subcutaneous TID WC  . ipratropium-albuterol  3 mL Nebulization BID  . isosorbide mononitrate  120 mg Oral Daily  . levothyroxine  50 mcg Oral QAC breakfast  . nutrition supplement (JUVEN)  1 packet Oral BID WC  . sodium chloride flush  3 mL Intravenous Q12H  . tamsulosin  0.4 mg Oral QPC supper  . vancomycin  125 mg Oral QID  . vancomycin  1,000 mg Intravenous Q48H  . Warfarin - Pharmacist Dosing Inpatient   Does not apply q1800   Continuous Infusions:   Marzetta Board, MD, PhD [facility] Pager [phone]  If 7PM-7AM, please contact night-coverage [email or web address] Password [email or web address] 08/18/2016, 2:04 PM

## 2016-08-18 NOTE — Plan of Care (Signed)
Problem: Health Behavior/Discharge Planning: Goal: Ability to manage health-related needs will improve Outcome: Not Applicable Date Met: 04/27/50 Patient on bedrest with total care provided  Problem: Physical Regulation: Goal: Will remain free from infection Outcome: Not Applicable Date Met: 02/58/52 Patient positive for C.Diff and MRSA   Problem: Skin Integrity: Goal: Risk for impaired skin integrity will decrease Outcome: Progressing Dressing changes ordered to sacral wound daily.  Turning patient q 2 hrs with pillow prop and floating heels on pillows.  Barrier cream applied to buttocks  Problem: Bowel/Gastric: Goal: Will not experience complications related to bowel motility Outcome: Completed/Met Date Met: 08/18/16 Incontinent of loose stools  Problem: Cardiac: Goal: Ability to achieve and maintain adequate cardiopulmonary perfusion will improve Outcome: Progressing Medications adjusted today for lower blood pressure

## 2016-08-18 NOTE — Progress Notes (Signed)
Subjective:  "Ive had a bad day"   Antibiotics:  Anti-infectives    Start     Dose/Rate Route Frequency Ordered Stop   08/19/16 1000  DAPTOmycin (CUBICIN) 752 mg in sodium chloride 0.9 % IVPB  Status:  Discontinued     8 mg/kg  94 kg 230.1 mL/hr over 30 Minutes Intravenous Every 24 hours 08/18/16 1444 08/18/16 1446   08/19/16 1000  DAPTOmycin (CUBICIN) 750 mg in sodium chloride 0.9 % IVPB     750 mg 230 mL/hr over 30 Minutes Intravenous Every 24 hours 08/18/16 1446     08/17/16 1130  vancomycin (VANCOCIN) IVPB 1000 mg/200 mL premix  Status:  Discontinued     1,000 mg 200 mL/hr over 60 Minutes Intravenous Every 48 hours 08/16/16 0848 08/18/16 1443   08/16/16 1300  vancomycin (VANCOCIN) IVPB 1000 mg/200 mL premix  Status:  Discontinued     1,000 mg 200 mL/hr over 60 Minutes Intravenous Every 24 hours 08/15/16 1108 08/16/16 0848   08/15/16 1400  piperacillin-tazobactam (ZOSYN) IVPB 3.375 g  Status:  Discontinued     3.375 g 12.5 mL/hr over 240 Minutes Intravenous Every 8 hours 08/15/16 0724 08/15/16 1342   08/15/16 1130  vancomycin (VANCOCIN) 1,500 mg in sodium chloride 0.9 % 500 mL IVPB     1,500 mg 250 mL/hr over 120 Minutes Intravenous  Once 08/15/16 1108 08/15/16 1330   08/14/16 1200  metroNIDAZOLE (FLAGYL) IVPB 500 mg  Status:  Discontinued     500 mg 100 mL/hr over 60 Minutes Intravenous Every 8 hours 08/14/16 1114 08/15/16 1342   08/14/16 1200  piperacillin-tazobactam (ZOSYN) IVPB 2.25 g  Status:  Discontinued     2.25 g 100 mL/hr over 30 Minutes Intravenous Every 6 hours 08/14/16 1115 08/15/16 0724   08/14/16 1115  piperacillin-tazobactam (ZOSYN) IVPB 3.375 g  Status:  Discontinued     3.375 g 12.5 mL/hr over 240 Minutes Intravenous Every 8 hours 08/14/16 1100 08/14/16 1115   08/13/16 1300  vancomycin (VANCOCIN) 50 mg/mL oral solution 125 mg     125 mg Oral 4 times daily 08/13/16 1138 05-Sep-2016 1359      Medications: Scheduled Meds: . amiodarone  100 mg  Oral Daily  . carvedilol  3.125 mg Oral BID WC  . collagenase   Topical Daily  . [START ON 08/19/2016] DAPTOmycin (CUBICIN)  IV  750 mg Intravenous Q24H  . feeding supplement (ENSURE ENLIVE)  237 mL Oral BID BM  . furosemide  40 mg Intravenous Q12H  . insulin aspart  0-15 Units Subcutaneous TID WC  . ipratropium-albuterol  3 mL Nebulization BID  . isosorbide mononitrate  120 mg Oral Daily  . levothyroxine  50 mcg Oral QAC breakfast  . nutrition supplement (JUVEN)  1 packet Oral BID WC  . sodium chloride flush  3 mL Intravenous Q12H  . tamsulosin  0.4 mg Oral QPC supper  . vancomycin  125 mg Oral QID  . Warfarin - Pharmacist Dosing Inpatient   Does not apply q1800   Continuous Infusions: PRN Meds:.sodium chloride, acetaminophen, albuterol, HYDROmorphone (DILAUDID) injection, metoprolol, ondansetron (ZOFRAN) IV, sodium chloride flush    Objective: Weight change: 3 lb 6.4 oz (1.542 kg)  Intake/Output Summary (Last 24 hours) at 08/18/16 1638 Last data filed at 08/18/16 1503  Gross per 24 hour  Intake              530 ml  Output  0 ml  Net              530 ml   Blood pressure 104/62, pulse 78, temperature 98.4 F (36.9 C), temperature source Oral, resp. rate 16, height 5\' 10"  (1.778 m), weight 207 lb 4.8 oz (94 kg), SpO2 100 %. Temp:  [98.2 F (36.8 C)-98.6 F (37 C)] 98.4 F (36.9 C) (02/07 1205) Pulse Rate:  [71-90] 78 (02/07 1635) Resp:  [16-18] 16 (02/07 1205) BP: (87-126)/(50-62) 104/62 (02/07 1635) SpO2:  [98 %-100 %] 100 % (02/07 1635) Weight:  [207 lb 4.8 oz (94 kg)] 207 lb 4.8 oz (94 kg) (02/07 0630)  Physical Exam: General: Sleeping but arousable  HEENT: anicteric sclera, pupils reactive to light and accommodation, EOMI CVS regular rate, normal r,  no murmur rubs or gallops Chest: clear to auscultation bilaterally, no wheezing, rales or rhonchi Abdomen: soft distended, dec bs, tender diffusely Skin: no rashes  Neuro: nonfocal   CBC: CBC Latest  Ref Rng & Units 08/18/2016 08/17/2016 08/16/2016  WBC 4.0 - 10.5 K/uL 20.3(H) 22.6(H) 21.7(H)  Hemoglobin 13.0 - 17.0 g/dL 7.5(L) 7.9(L) 8.2(L)  Hematocrit 39.0 - 52.0 % 22.5(L) 24.0(L) 24.8(L)  Platelets 150 - 400 K/uL 283 273 233     BMET  Recent Labs  08/17/16 1002 08/18/16 0538  NA 135 135  K 4.2 4.4  CL 102 103  CO2 19* 19*  GLUCOSE 155* 125*  BUN 127* 130*  CREATININE 3.81* 4.19*  CALCIUM 8.5* 8.3*     Liver Panel  No results for input(s): PROT, ALBUMIN, AST, ALT, ALKPHOS, BILITOT, BILIDIR, IBILI in the last 72 hours.     Sedimentation Rate No results for input(s): ESRSEDRATE in the last 72 hours. C-Reactive Protein No results for input(s): CRP in the last 72 hours.  Micro Results: Recent Results (from the past 720 hour(s))  MRSA PCR Screening     Status: Abnormal   Collection Time: 08/11/16  9:25 PM  Result Value Ref Range Status   MRSA by PCR POSITIVE (A) NEGATIVE Final    Comment:        The GeneXpert MRSA Assay (FDA approved for NASAL specimens only), is one component of a comprehensive MRSA colonization surveillance program. It is not intended to diagnose MRSA infection nor to guide or monitor treatment for MRSA infections. RESULT CALLED TO, READ BACK BY AND VERIFIED WITH: E.GARNETT,RN AT 2329 BY L.PITT 08/11/16   C difficile quick scan w PCR reflex     Status: Abnormal   Collection Time: 08/12/16  5:52 PM  Result Value Ref Range Status   C Diff antigen POSITIVE (A) NEGATIVE Final   C Diff toxin POSITIVE (A) NEGATIVE Final   C Diff interpretation Toxin producing C. difficile detected.  Final    Comment: CRITICAL RESULT CALLED TO, READ BACK BY AND VERIFIED WITH: Nilda Riggs RN [id number] 08/12/16 A BROWNING   Culture, Urine     Status: Abnormal   Collection Time: 08/14/16 12:23 PM  Result Value Ref Range Status   Specimen Description URINE, RANDOM  Final   Special Requests Oral vanc  Final   Culture (A)  Final    >=100,000 COLONIES/mL  METHICILLIN RESISTANT STAPHYLOCOCCUS AUREUS   Report Status 08/16/2016 FINAL  Final   Organism ID, Bacteria METHICILLIN RESISTANT STAPHYLOCOCCUS AUREUS (A)  Final      Susceptibility   Methicillin resistant staphylococcus aureus - MIC*    CIPROFLOXACIN >=8 RESISTANT Resistant     GENTAMICIN <=0.5 SENSITIVE Sensitive  NITROFURANTOIN <=16 SENSITIVE Sensitive     OXACILLIN >=4 RESISTANT Resistant     TETRACYCLINE <=1 SENSITIVE Sensitive     VANCOMYCIN 1 SENSITIVE Sensitive     TRIMETH/SULFA >=320 RESISTANT Resistant     CLINDAMYCIN <=0.25 SENSITIVE Sensitive     RIFAMPIN <=0.5 SENSITIVE Sensitive     Inducible Clindamycin NEGATIVE Sensitive     * >=100,000 COLONIES/mL METHICILLIN RESISTANT STAPHYLOCOCCUS AUREUS  Respiratory Panel by PCR     Status: None   Collection Time: 08/14/16 12:23 PM  Result Value Ref Range Status   Adenovirus NOT DETECTED NOT DETECTED Final   Coronavirus 229E NOT DETECTED NOT DETECTED Final   Coronavirus HKU1 NOT DETECTED NOT DETECTED Final   Coronavirus NL63 NOT DETECTED NOT DETECTED Final   Coronavirus OC43 NOT DETECTED NOT DETECTED Final   Metapneumovirus NOT DETECTED NOT DETECTED Final   Rhinovirus / Enterovirus NOT DETECTED NOT DETECTED Final   Influenza A NOT DETECTED NOT DETECTED Final   Influenza B NOT DETECTED NOT DETECTED Final   Parainfluenza Virus 1 NOT DETECTED NOT DETECTED Final   Parainfluenza Virus 2 NOT DETECTED NOT DETECTED Final   Parainfluenza Virus 3 NOT DETECTED NOT DETECTED Final   Parainfluenza Virus 4 NOT DETECTED NOT DETECTED Final   Respiratory Syncytial Virus NOT DETECTED NOT DETECTED Final   Bordetella pertussis NOT DETECTED NOT DETECTED Final   Chlamydophila pneumoniae NOT DETECTED NOT DETECTED Final   Mycoplasma pneumoniae NOT DETECTED NOT DETECTED Final  Culture, blood (Routine X 2) w Reflex to ID Panel     Status: Abnormal   Collection Time: 08/14/16  1:45 PM  Result Value Ref Range Status   Specimen Description BLOOD  RIGHT ANTECUBITAL  Final   Special Requests BOTTLES DRAWN AEROBIC AND ANAEROBIC 5CC  Final   Culture  Setup Time   Final    GRAM POSITIVE COCCI IN CLUSTERS IN BOTH AEROBIC AND ANAEROBIC BOTTLES CRITICAL RESULT CALLED TO, READ BACK BY AND VERIFIED WITH: E MARTIN,PHARMD AT 1013 08/15/16 BY L BENFIELD    Culture METHICILLIN RESISTANT STAPHYLOCOCCUS AUREUS (A)  Final   Report Status 08/17/2016 FINAL  Final   Organism ID, Bacteria METHICILLIN RESISTANT STAPHYLOCOCCUS AUREUS  Final      Susceptibility   Methicillin resistant staphylococcus aureus - MIC*    CIPROFLOXACIN >=8 RESISTANT Resistant     ERYTHROMYCIN >=8 RESISTANT Resistant     GENTAMICIN <=0.5 SENSITIVE Sensitive     OXACILLIN >=4 RESISTANT Resistant     TETRACYCLINE <=1 SENSITIVE Sensitive     VANCOMYCIN <=0.5 SENSITIVE Sensitive     TRIMETH/SULFA >=320 RESISTANT Resistant     CLINDAMYCIN <=0.25 SENSITIVE Sensitive     RIFAMPIN <=0.5 SENSITIVE Sensitive     Inducible Clindamycin NEGATIVE Sensitive     * METHICILLIN RESISTANT STAPHYLOCOCCUS AUREUS  Blood Culture ID Panel (Reflexed)     Status: Abnormal   Collection Time: 08/14/16  1:45 PM  Result Value Ref Range Status   Enterococcus species NOT DETECTED NOT DETECTED Final   Listeria monocytogenes NOT DETECTED NOT DETECTED Final   Staphylococcus species DETECTED (A) NOT DETECTED Final    Comment: CRITICAL RESULT CALLED TO, READ BACK BY AND VERIFIED WITH: E MARTIN,PHARMD AT 1013 08/15/16 BY L BENFIELD    Staphylococcus aureus DETECTED (A) NOT DETECTED Final    Comment: Methicillin (oxacillin)-resistant Staphylococcus aureus (MRSA). MRSA is predictably resistant to beta-lactam antibiotics (except ceftaroline). Preferred therapy is vancomycin unless clinically contraindicated. Patient requires contact precautions if  hospitalized. CRITICAL RESULT CALLED TO, READ  BACK BY AND VERIFIED WITH: E MARTIN,PHARMD AT 1013 08/15/16 BY L BENFIELD    Methicillin resistance DETECTED (A) NOT  DETECTED Final    Comment: CRITICAL RESULT CALLED TO, READ BACK BY AND VERIFIED WITH: E MARTIN,PHARMD AT 1013 08/15/16 BY L BENFIELD    Streptococcus species NOT DETECTED NOT DETECTED Final   Streptococcus agalactiae NOT DETECTED NOT DETECTED Final   Streptococcus pneumoniae NOT DETECTED NOT DETECTED Final   Streptococcus pyogenes NOT DETECTED NOT DETECTED Final   Acinetobacter baumannii NOT DETECTED NOT DETECTED Final   Enterobacteriaceae species NOT DETECTED NOT DETECTED Final   Enterobacter cloacae complex NOT DETECTED NOT DETECTED Final   Escherichia coli NOT DETECTED NOT DETECTED Final   Klebsiella oxytoca NOT DETECTED NOT DETECTED Final   Klebsiella pneumoniae NOT DETECTED NOT DETECTED Final   Proteus species NOT DETECTED NOT DETECTED Final   Serratia marcescens NOT DETECTED NOT DETECTED Final   Haemophilus influenzae NOT DETECTED NOT DETECTED Final   Neisseria meningitidis NOT DETECTED NOT DETECTED Final   Pseudomonas aeruginosa NOT DETECTED NOT DETECTED Final   Candida albicans NOT DETECTED NOT DETECTED Final   Candida glabrata NOT DETECTED NOT DETECTED Final   Candida krusei NOT DETECTED NOT DETECTED Final   Candida parapsilosis NOT DETECTED NOT DETECTED Final   Candida tropicalis NOT DETECTED NOT DETECTED Final  Culture, blood (Routine X 2) w Reflex to ID Panel     Status: Abnormal   Collection Time: 08/14/16  1:55 PM  Result Value Ref Range Status   Specimen Description BLOOD BLOOD LEFT HAND  Final   Special Requests BOTTLES DRAWN AEROBIC AND ANAEROBIC 5CC  Final   Culture  Setup Time   Final    GRAM POSITIVE COCCI IN CLUSTERS IN BOTH AEROBIC AND ANAEROBIC BOTTLES CRITICAL RESULT CALLED TO, READ BACK BY AND VERIFIED WITH: E MARTIN,PHARMD AT 1013 08/15/16 BY L BENFIELD    Culture (A)  Final    STAPHYLOCOCCUS AUREUS SUSCEPTIBILITIES PERFORMED ON PREVIOUS CULTURE WITHIN THE LAST 5 DAYS.    Report Status 08/17/2016 FINAL  Final  Culture, blood (Routine X 2) w Reflex to ID  Panel     Status: None (Preliminary result)   Collection Time: 08/15/16  2:14 PM  Result Value Ref Range Status   Specimen Description BLOOD RIGHT ANTECUBITAL  Final   Special Requests BOTTLES DRAWN AEROBIC AND ANAEROBIC 10 CC EACH  Final   Culture NO GROWTH 3 DAYS  Final   Report Status PENDING  Incomplete    Studies/Results: Dg Chest 1 View  Result Date: 08/17/2016 CLINICAL DATA:  Acute renal failure EXAM: CHEST 1 VIEW COMPARISON:  08/14/2016 FINDINGS: Post sternotomy changes are again visualized. There is mild cardiomegaly. Development of small bilateral effusions and hazy bibasilar atelectasis or infiltrate. Atherosclerosis. No pneumothorax. IMPRESSION: 1. Development of small bilateral pleural effusions with increased hazy bibasilar atelectasis or infiltrate 2. Cardiomegaly Electronically Signed   By: Donavan Foil M.D.   On: 08/17/2016 20:40   US Renal  Result Date: 08/17/2016 CLINICAL DATA:  Acute renal failure EXAM: RENAL / URINARY TRACT ULTRASOUND COMPLETE COMPARISON:  CT 08/14/2016, 06/30/2016 FINDINGS: Right Kidney: Length: 11.1 cm. Diffuse increased echogenicity. No hydronephrosis. The previously noted cyst is not well identified on the current exam Left Kidney: Length: 10.9 cm. Diffuse increased echogenicity. No hydronephrosis. Bladder: Appears normal for degree of bladder distention. Incidentally noted is a small amount of abdominal ascites IMPRESSION: 1. Echogenic kidneys consistent with medical renal disease. No hydronephrosis. 2. Small volume of abdominal ascites  Electronically Signed   By: Donavan Foil M.D.   On: 08/17/2016 20:39      Assessment/Plan:  INTERVAL HISTORY: repeat blood culture yesterday one site no growth   Active Problems:   Diabetes mellitus (HCC)   CKD (chronic kidney disease), stage IV (HCC)   Anemia of chronic disease   CAD of autologous bypass graft   Acute renal failure superimposed on stage 4 chronic kidney disease (HCC)   HCAP  (healthcare-associated pneumonia)   C. difficile colitis   Acute on chronic diastolic CHF (congestive heart failure) (HCC)   Warfarin anticoagulation   Failure to thrive (0-17)   Malnutrition of moderate degree   Palliative care by specialist   AKI (acute kidney injury) ([place])   MRSA infection   DNR (do not resuscitate)   Shortness of breath    [name]. is a 81 y.o. male with  With CKD< CHF, FTT, malnutrition , CDI now with MRSA bacteremia   #1 MRSA Bacteremia:       [place] [facility] Staphylococcus aureus bacteremia   Staphylococcus aureus bacteremia (SAB) is associated with a high rate of complications and mortality.  Specific aspects of clinical management are critical to optimizing the outcome of patients with SAB.  Therefore, the [facility] Antimicrobial Management Team [facility]) has initiated an intervention aimed at improving the management of SAB at [facility].  To do so, Infectious Diseases physicians are providing an evidence-based consult for the management of all patients with SAB.     Yes No Comments  Perform follow-up blood cultures (even if the patient is afebrile) to ensure clearance of bacteremia [x]  []  BLOOD CULTURES FROM 08/15/16 no growth x 3 days  Remove vascular catheter and obtain follow-up blood cultures after the removal of the catheter [x]  []  DO NOT PLACE PICC OR CENTRAL LINE UNTIL BLOOD CULTURES FROM 4TH ARE [place] AT 4 DAYS   Perform echocardiography to evaluate for endocarditis (transthoracic ECHO is 40-50% sensitive, TEE is > 90% sensitive) [x]  []  Please keep in mind, that neither test can definitively EXCLUDE endocarditis, and that should clinical suspicion remain high for endocarditis the patient should then still be treated with an "endocarditis" duration of therapy = 6 weeks   TTE no vegetations  Consult electrophysiologist to evaluate implanted cardiac device (pacemaker, ICD) []  []  NA  Ensure source control [x]  []   Have all abscesses been drained effectively? Have deep seeded infections (septic joints or osteomyelitis) had appropriate surgical debridement?  SOURCE UNCLEAR  Investigate for "metastatic" sites of infection [x]  []  Does the patient have ANY symptom or physical exam finding that would suggest a deeper infection (back or neck pain that may be suggestive of vertebral osteomyelitis or epidural abscess, muscle pain that could be a symptom of pyomyositis)?  Keep in mind that for deep seeded infections MRI imaging with contrast is preferred rather than other often insensitive tests such as plain x-rays, especially early in a patient's presentation.  Change antibiotic therapy to Daptomycin given CR increase []  []  Beta-lactam antibiotics are preferred for MSSA due to higher cure rates.   If on Vancomycin, goal trough should be 15 - 20 mcg/mL  Estimated duration of IV antibiotic therapy:    Would finish 4 weeks of antibiotics for MRSA bacteremia either with dapto IV q 48 hours vs oral zyvox 600mg  po bid Pharmacy to discuss with CM feasability of either with dc to hopsice  []  []  Consult case management for probably prolonged outpatient IV antibiotic  therapy   #2 CDI: complete 2 weeks of vancomycin po   Diagnosis: MRSA bacteremia  Culture Result: MRSA  Allergies  Allergen Reactions  . Nsaids Nausea Only    Reaction:  GI upset   . Ibuprofen Other (See Comments)    Reaction:  GI upset   . Ace Inhibitors Cough    Discharge antibiotics: Per pharmacy protocol Daptomycin  Duration: 4 weeks  End Date: 09/12/16  [facility] Care Per Protocol:  Labs weekly while on IV antibiotics: _x_ CBC with differential _x_ BMP _x_ CK   IF daptomycin not feasible at hospice then would substitute 600 mg of Zyvox twice daily to complete the above course.    _x_ Please pull PIC at completion of IV antibiotics __ Please leave PIC in place until doctor has seen patient or been notified  Fax weekly labs to [phone]   I will sign off for now  Please call with further questions     LOS: 7 days   Alcide Evener 08/18/2016, 4:38 PM

## 2016-08-18 NOTE — Progress Notes (Signed)
Text paged Dr. Cruzita Lederer  with crtical PT 54.1 and INR 5.83  value consistent with previous reported labs.

## 2016-08-18 NOTE — Telephone Encounter (Signed)
Mr. Wolthuis is returning a call . Thanks

## 2016-08-19 DIAGNOSIS — N179 Acute kidney failure, unspecified: Secondary | ICD-10-CM

## 2016-08-19 LAB — GLUCOSE, CAPILLARY
GLUCOSE-CAPILLARY: 120 mg/dL — AB (ref 65–99)
Glucose-Capillary: 128 mg/dL — ABNORMAL HIGH (ref 65–99)

## 2016-08-19 LAB — COMPREHENSIVE METABOLIC PANEL
ALK PHOS: 78 U/L (ref 38–126)
ALT: 20 U/L (ref 17–63)
ANION GAP: 13 (ref 5–15)
AST: 48 U/L — ABNORMAL HIGH (ref 15–41)
Albumin: 1.5 g/dL — ABNORMAL LOW (ref 3.5–5.0)
BUN: 136 mg/dL — ABNORMAL HIGH (ref 6–20)
CALCIUM: 8.2 mg/dL — AB (ref 8.9–10.3)
CO2: 19 mmol/L — AB (ref 22–32)
Chloride: 102 mmol/L (ref 101–111)
Creatinine, Ser: 4.59 mg/dL — ABNORMAL HIGH (ref 0.61–1.24)
GFR, EST AFRICAN AMERICAN: 13 mL/min — AB (ref >60)
GFR, EST NON AFRICAN AMERICAN: 11 mL/min — AB (ref >60)
Glucose, Bld: 108 mg/dL — ABNORMAL HIGH (ref 65–99)
Potassium: 4.4 mmol/L (ref 3.5–5.1)
Sodium: 134 mmol/L — ABNORMAL LOW (ref 135–145)
TOTAL PROTEIN: 5.8 g/dL — AB (ref 6.5–8.1)
Total Bilirubin: 0.6 mg/dL (ref 0.3–1.2)

## 2016-08-19 LAB — CBC
HCT: 23.5 % — ABNORMAL LOW (ref 39.0–52.0)
HEMOGLOBIN: 7.7 g/dL — AB (ref 13.0–17.0)
MCH: 26.7 pg (ref 26.0–34.0)
MCHC: 32.8 g/dL (ref 30.0–36.0)
MCV: 81.6 fL (ref 78.0–100.0)
PLATELETS: 281 10*3/uL (ref 150–400)
RBC: 2.88 MIL/uL — AB (ref 4.22–5.81)
RDW: 18.5 % — ABNORMAL HIGH (ref 11.5–15.5)
WBC: 13.7 10*3/uL — AB (ref 4.0–10.5)

## 2016-08-19 LAB — CK: CK TOTAL: 57 U/L (ref 49–397)

## 2016-08-19 LAB — PROTIME-INR
INR: 6.1
PROTHROMBIN TIME: 56 s — AB (ref 11.4–15.2)

## 2016-08-19 LAB — MAGNESIUM: Magnesium: 1.9 mg/dL (ref 1.7–2.4)

## 2016-08-19 MED ORDER — SODIUM CHLORIDE 0.9 % IV SOLN
750.0000 mg | INTRAVENOUS | Status: DC
  Administered 2016-08-19: 750 mg via INTRAVENOUS
  Filled 2016-08-19: qty 15

## 2016-08-19 MED ORDER — PHYTONADIONE 5 MG PO TABS
5.0000 mg | ORAL_TABLET | Freq: Once | ORAL | Status: AC
  Administered 2016-08-19: 5 mg via ORAL
  Filled 2016-08-19: qty 1

## 2016-08-19 NOTE — Progress Notes (Signed)
Daily Progress Note   Patient Name: [name] LEDWITH Sr.       Date: 08/19/2016 DOB: [birth year]  Age: 81 y.o. MRN#: [id number] Attending Physician: Caren Griffins, MD Primary Care Physician: [name], MD Admit Date: 08/11/2016  Reason for Consultation/Follow-up: Disposition and Psychosocial/spiritual support  Subjective: [name] is similar to yesterday. He had only a few bites of food yesterday, with similar intake today. He is extremely lethargic and slow to respond. He endorses feeling "worn out."  Length of Stay: 8  Current Medications: Scheduled Meds:  . amiodarone  100 mg Oral Daily  . carvedilol  3.125 mg Oral BID WC  . collagenase   Topical Daily  . DAPTOmycin (CUBICIN)  IV  750 mg Intravenous Q48H  . feeding supplement (ENSURE ENLIVE)  237 mL Oral BID BM  . insulin aspart  0-15 Units Subcutaneous TID WC  . isosorbide mononitrate  120 mg Oral Daily  . levothyroxine  50 mcg Oral QAC breakfast  . nutrition supplement (JUVEN)  1 packet Oral BID WC  . sodium chloride flush  3 mL Intravenous Q12H  . tamsulosin  0.4 mg Oral QPC supper  . vancomycin  125 mg Oral QID  . Warfarin - Pharmacist Dosing Inpatient   Does not apply q1800    Continuous Infusions:  PRN Meds: sodium chloride, acetaminophen, albuterol, HYDROmorphone (DILAUDID) injection, metoprolol, ondansetron (ZOFRAN) IV, sodium chloride flush  Physical Exam    Constitutional: Vital signs are normal. He appears lethargic. He has a sickly appearance.  Frail man lying in bed  HENT:  Head: Normocephalic and atraumatic.  Mouth/Throat: Mucous membranes are dry. No oropharyngeal exudate.  Eyes: EOM are normal.  Neck: Normal range of motion.  Pulmonary/Chest: Accessory muscle usage present.  Dyspnea at rest, requires repeated breaks during brief conversation  to catch breath. Musculoskeletal:  Generalized weakness  Neurological: He appears lethargic.  Fully oriented, just slow to respond and repeat questions cues now required Psychiatric: He has a normal mood and affect. Judgment and thought content normal. His speech is delayed. He is slowed and withdrawn. Cognition and memory are normal.        Vital Signs: BP 130/61 (BP Location: Right Arm)   Pulse 87   Temp 98.7 F (37.1 C) (Oral)   Resp 20   Ht _0  (1.778 m)   Wt 85 kg (187 lb 8 oz)   SpO2 100%   BMI 26.90 kg/m  SpO2: SpO2: 100 % O2 Device: O2 Device: Nasal Cannula O2 Flow Rate: O2 Flow Rate (L/min): 3 L/min  Intake/output summary:   Intake/Output Summary (Last 24 hours) at 08/19/16 1353 Last data filed at 08/19/16 1303  Gross per 24 hour  Intake              945 ml  Output                0 ml  Net              945 ml   LBM: Last BM Date: 08/18/16 Baseline Weight: Weight: 90.7 kg (200 lb) Most recent weight: Weight: 85 kg (187 lb 8 oz)  Palliative Assessment/Data: PPS 10-20%   Flowsheet Rows  Flowsheet Row Most Recent Value  Intake Tab  Referral Department  Cardiology  Unit at Time of Referral  Cardiac/Telemetry Unit  Palliative Care Primary Diagnosis  Cardiac  Date Notified  08/11/16  Palliative Care Type  Return patient Palliative Care  Reason for referral  Clarify Goals of Care  Date of Admission  08/11/16  # of days IP prior to Palliative referral  0  Clinical Assessment  Psychosocial & Spiritual Assessment  Palliative Care Outcomes      Patient Active Problem List   Diagnosis Date Noted  . Shortness of breath   . DNR (do not resuscitate)   . MRSA infection   . AKI (acute kidney injury) ([place])   . Malnutrition of moderate degree 08/13/2016  . Palliative care by specialist   . Failure to thrive (0-17) 08/11/2016  . Serratia marcescens infection ([place]) 07/31/2016  . Foreskin does not retract 07/31/2016  . Ulcer of urethral meatus 07/31/2016  .  Hyperlipidemia 07/06/2016  . BPH (benign prostatic hyperplasia) 07/06/2016  . Acute back pain 06/24/2016  . Acute on chronic diastolic CHF (congestive heart failure) ([place]) 06/24/2016  . Debility 06/24/2016  . Leg weakness, bilateral 06/24/2016  . Pseudomonas urinary tract infection 06/24/2016  . Fall 06/24/2016  . Warfarin anticoagulation 06/24/2016  . Encounter for therapeutic drug monitoring 06/07/2016  . Pressure injury of skin 05/27/2016  . Acute deep vein thrombosis (DVT) of right upper extremity ([place]) 05/25/2016  . History of pacemaker 05/03/2016  . Anemia, iron deficiency 04/16/2016  . Endocarditis of tricuspid valve   . C. difficile colitis 04/10/2016  . HCAP (healthcare-associated pneumonia) 04/08/2016  . Chronic kidney disease, stage III (moderate) 03/09/2016  . Pressure ulcer 02/23/2016  . Seizure ([place]) 02/20/2016  . Palliative care encounter   . Goals of care, counseling/discussion   . Pacemaker infection ([place])   . Acute renal failure superimposed on stage 4 chronic kidney disease ([place]) 01/26/2016  . MRSA bacteremia   . Pseudogout involving multiple joints 01/24/2016  . Bladder mass 01/24/2016  . Hemoptysis   . Pain   . Left upper extremity swelling   . Staphylococcus aureus bacteremia 01/17/2016  . CAP (community acquired pneumonia) 01/16/2016  . S/P hernia repair 10/28/2015  . Right inguinal hernia 09/09/2015  . GIB (gastrointestinal bleeding) 08/29/2015  . Hematuria 05/19/2015  . Thrombocytopenia ([place]) 11/11/2014  . Leukopenia 11/11/2014  . B12 deficiency 08/31/2013  . Pacemaker 08/26/2013  . On amiodarone therapy 08/21/2013  . Gout due to renal impairment, left ankle and foot 06/13/2013    Class: Acute  . CAD of autologous bypass graft 06/05/2013  . MDS (myelodysplastic syndrome) ([place]) 05/22/2013  . Anemia of chronic disease 03/19/2013  . CKD (chronic kidney disease), stage IV ([place]) 03/18/2013  . NSVT (nonsustained ventricular tachycardia) ([place])  03/18/2013  . Chronic diastolic heart failure ([place]) 03/17/2013  . Diabetes mellitus ([place]) 03/17/2013  . Essential hypertension 03/17/2013  . Mesenteric artery stenosis ([place]) 05/01/2012  . PAF (paroxysmal atrial fibrillation) ([place]) 12/24/2011  . PAD (peripheral artery disease) ([place]) 12/24/2011  . Chronic vascular insufficiency of intestine ([facility]) 12/20/2011  . Personal history of DVT (deep vein thrombosis) 07/12/2009    Palliative Care Assessment & Plan   HPI: 81 y.o. 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 Diff, DVT in RUE, hypothyroidism, and failure to thrive who presented to his cardiologist office for follow-up after they increased his diuretic and  was found to be extremely weak, decreased alertness, and poor oral intake. He was subsequently admitted on 08/11/2016 for failure to thrive. The plan with admission is to attempt to get him feeling better, as well as facilitate a meeting with palliative care to discuss goals of care.  Assessment: I met with [name] and extended family on 2/2. Please see my initial consult note for full details of that conversation. In brief, [name] elected to pursue comfort care with Hospice. After evaluation and a follow-up conversation with [facility], they did not feel he meet residential hospice criteria. Of note, the main reason for this was improvement in his oral intake since hospitalization and ability to take oral medication. Prior to admission he was eating minimally (half a meal per day, per pt), however his appetite has improved since being here and he is eating the majority of breakfast and lunch now. SW was able to determine that he could have Hospice at his SNF for no change in cost. I spoke with his daughter on 2/2 in the evening to relate this, which she was in agreement with.   Unfortunately, prior to discharge he developed  a fever. Work-up revealed sepsis with MRSA UTI and MRSA bacteremia, he also has known recurrent C. Diff. Hospitalist service is now primary (had been cardiology), with input from infectious disease. I had met with pt and family on 2/3 and discussed concern for infection (prior to cultures returning), and the decision was made to medically optimize (i.e investigate and treat) prior to discharge. Palliative also followed-up on 2/5 to support family in ongoing goals of care conversations, and DNR status and plan to treat what is treatable re-confirmed.   Despite interventions to medically optimize, he continued to deteriorate. His intake has diminished again, to the point that he is only drinking a few sips and eating a few bites throughout the day. He is increasingly lethargic and has no strength or energy. After meeting with him and his wife on 2/7, they acknowledged his decline but wanted one more day to see if there was improvement. Today, he appears more weak and tired. Additionally, his kidney function has continued to [name] with Cr 4.59. His nutrition status remains poor with Albumin 1.5. I had a large family meeting to discuss everything. We talked about the infection, his heart and kidney function, and his poor appetite and increasingly lethargy and weakness. I asked [name] directly how he would like to progress, offering ongoing medical interventions such as what was currently occurring, versus transitioning towards comfort and hospice. He said he was ready for the medical interventions to stop and wanted Hospice.   Recommendations/Plan:  DNR, SW contacted to facilitate residential hospice referral ([facility])  I would continue antibiotics until discharge, however at discharge to residential hospice stop them (would continue oral vancomycin as palliative--given significant discomfort and burden of diarrhea)   Symptoms:  Pain/dyspnea: PRN IV dilaudid (avoid morphine d/t renal fxn  and risk for toxic metabolite build-up)  Wheezing: PRN albuterol  Diarrhea: Known C. Diff with ongoing treatment  Code Status:  DNR  Prognosis:   < 2 weeks; [name] is quickly deteriorating. He is lethargic and feels weak. He also has confirmed MRSA bacteremia with ongoing instability in his vitals. Worsening renal function as well.   Discharge Planning:  Hospice facility  Care plan was discussed with pt, extensive family, care nurse, and SW  Thank you for allowing the Palliative Medicine Team  to assist in the care of this patient.  Total time: 75 minutes Time in: 1330 Time out: 1420    Greater than 50%  of this time was spent counseling and coordinating care related to the above assessment and plan.  Charlynn Court, NP Palliative Medicine Team [phone] pager (7a-5p) Team Phone # [phone]

## 2016-08-19 NOTE — Clinical Social Work Note (Signed)
CSW facilitated patient discharge including contacting patient family and facility to confirm patient discharge plans. Clinical information faxed to facility and family agreeable with plan. CSW arranged ambulance transport via [facility] to [facility] at 4:30 pm. RN to call report prior to discharge [phone]).  CSW will sign off for now as social work intervention is no longer needed. Please consult [place] again if new needs arise.  Dayton Scrape, [place]

## 2016-08-19 NOTE — Progress Notes (Signed)
Patient for transfer to [facility] this afternoon.  Gown changed and Dilaudid 0.5 mg given IV in anticipation of ambulance transport.  Family at bedside awaiting ambulance.

## 2016-08-19 NOTE — Discharge Summary (Signed)
Physician Discharge Summary  [name] [id number] DOB: 06-10-1936 DOA: 08/11/2016  PCP: [name], MD  Admit date: 08/11/2016 Discharge date: 08/19/2016  Admitted From: home Disposition:  Residential hospice  Recommendations for Outpatient Follow-up:  1. Discharge to residential hospice  Discharge Condition: end of life care CODE STATUS: DNR Diet recommendation: comfort feeding  HPI: Per Dr. Michaelyn Barter Sr.is a 81 y.o.malewho presents today for a 3 week check. Seen for Dr. Tamala Julian.He has a history of CAD status post CABG 1994 with subsequent stents and repeat CABG in 2013, hypertension, HLD, CKD, chronic diastolic heart failure, sinus bradycardia sp PPM (SJM) by Dr Leonia Reeves.Has PAF, CHADSVASC=5but is not anticoagulated for atrial fibrillation due to prior GI bleed. He is on aspirin and Plavix. SawDr. Rayann Heman in 10/2015 to discuss watchman. Because he had no atrial fibrillation for the past 6 months conservative management was recommended. Amiodarone was continued. Has had prior C. difficile colitis, bacteremia, TV endocarditis with lead infection/device explanted and recurrent CHF exacerbation and a right upper arm DVT. Pacer lead explantation was required. He is now on Coumadin therapy. Readmitted back in December with generalized weakness and a fall.  Since last office visit on December 8, he has been readmitted to the hospital and diuretic therapy once again has been significantly decreased. He again had recurrence of C. difficile while hospitalized. The most recent discharge occurred on 07/01/16. Seen earlier this month by Dr. Tamala Julian - now at [facility]. Was volume overloaded. Refused to be readmitted due to fear of recurrent Cdiff. Comes in today. Here with his son today. Not doing well. Not doing well at all. Very weak and tired. Some pain between the shoulder blades. Son notes he seems lifeless. No chest pain. Not able to stand. Says his swelling is "a little  better". Little dizzy.   Hospital Course: Discharge Diagnoses:  Active Problems:   Diabetes mellitus (HCC)   CKD (chronic kidney disease), stage IV (HCC)   Anemia of chronic disease   CAD of autologous bypass graft   Acute renal failure superimposed on stage 4 chronic kidney disease (HCC)   HCAP (healthcare-associated pneumonia)   C. difficile colitis   Acute on chronic diastolic CHF (congestive heart failure) (HCC)   Warfarin anticoagulation   Failure to thrive (0-17)   Malnutrition of moderate degree   Palliative care by specialist   AKI (acute kidney injury) ([place])   MRSA infection   DNR (do not resuscitate)   Shortness of breath   Acute renal failure ([place])  Sepsis secondary to MRSA bacteremia, Staph aureus UTI, C. Diff - started vancomycin IV 2/4, ID team consulted and followed patient while hospitalized, plans were for 4 weeks of daptomycin however patient's care will be transitioned to comfort per patient and patient's family after discussions with palliative care team.  Recurrent C diff -  goals transition to comfort, hospice facility placement now Staph aureus UTI - on Daptomycin while hospitalized, goals transition to comfort Acute respiratory failure - stable Chronic diastolic CHF - cardiology singed off as no further recommendations, stable CAD - S/p CABG and redo CABG. No chest pain. Trop mildly elevated w/ flat trend. Suspect demand ischemia in the setting of diast CHF/generalized illness PAF, CHADS 2 score 5 Right Upper Ext DVT  Normocytic anemia Hypothyroidism Acute renal failure superimposed on stage 4 chronic kidney disease ([place]) - patient with progressive renal failure during this hospitalization, poor output and progressive encephalopathy. He is essentially bedbound with sacral ulcers, will not  be a good HD candidate.  Moderate PCM - appreciate nutritionist recommendations  Decubitus ulcer, sacral area, also bilateral heals - Sacrum with unstageable pressure  injury; 6X6cm, 10% red, 90% tightly adhered yellow slough, Pt is frequently incontinent and it is difficult to keep the wound from becoming soiled, Right outer ankle with previously noted unstageable pressure injury; it has evolved into stage 2 pressure injury during this assessment. .2X.2X.1cm, pink and dry, no odor or drainage - Right heel with clear fluid filled intact blister; stage 2 pressure injury; 1X2cm - Left outer heel with the same appearance; .5X3cm - awaiting Hospice placement Acute functional quadriplegia - in the setting of multiple acute medical conditions and co morbidities. Pt is poorly responsive to current medical care, Cr trending up Goals of care - palliative consulted, after discussing with the family, transition patient's care towards comfort. Discharge to residential hospice   Discharge Instructions   Allergies as of 08/19/2016      Reactions   Nsaids Nausea Only   Reaction:  GI upset    Ibuprofen Other (See Comments)   Reaction:  GI upset    Ace Inhibitors Cough      Medication List    STOP taking these medications   atorvastatin 80 MG tablet Commonly known as:  LIPITOR   calcium carbonate 1500 (600 Ca) MG Tabs tablet Commonly known as:  OSCAL   carvedilol 25 MG tablet Commonly known as:  COREG   ciprofloxacin 500 MG tablet Commonly known as:  CIPRO   cyanocobalamin 1000 MCG/ML injection Commonly known as:  (VITAMIN B-12)   epoetin alfa 10000 UNIT/ML injection Commonly known as:  EPOGEN,PROCRIT   ferrous sulfate 325 (65 FE) MG tablet   hydrALAZINE 25 MG tablet Commonly known as:  APRESOLINE   leuprolide 30 MG injection Commonly known as:  LUPRON   warfarin 5 MG tablet Commonly known as:  COUMADIN   warfarin 6 MG tablet Commonly known as:  COUMADIN     TAKE these medications   acetaminophen 650 MG CR tablet Commonly known as:  TYLENOL Take 650 mg by mouth 2 (two) times daily.   amiodarone 100 MG tablet Commonly known as:   PACERONE Take 100 mg by mouth daily.   amLODipine 5 MG tablet Commonly known as:  NORVASC Take 1 tablet (5 mg total) by mouth daily.   feeding supplement (GLUCERNA 1.2 CAL) Liqd Take 237 mLs by mouth 2 (two) times daily as needed (for nutritional supplement).   feeding supplement (GLUCERNA SHAKE) Liqd Take 237 mLs by mouth 2 (two) times daily between meals. To support nutritional status   feeding supplement (PRO-STAT SUGAR FREE 64) Liqd Take 30 mLs by mouth 2 (two) times daily with a meal.   furosemide 80 MG tablet Commonly known as:  LASIX Take 1 tablet (80 mg total) by mouth 2 (two) times daily. What changed:  how much to take   isosorbide mononitrate 120 MG 24 hr tablet Commonly known as:  IMDUR Take 120 mg by mouth daily.   levothyroxine 50 MCG tablet Commonly known as:  SYNTHROID Take 1 tablet (50 mcg total) by mouth daily before breakfast.   multivitamin with minerals Tabs tablet Take 1 tablet by mouth daily.   nystatin powder Generic drug:  nystatin Apply 1 g topically 2 (two) times daily. To groin area for candida   potassium chloride SA 20 MEQ tablet Commonly known as:  K-DUR,KLOR-CON Take 20 mEq by mouth 2 (two) times daily.   Probiotic 250 MG  Caps Take 1 capsule by mouth 2 (two) times daily.   silodosin 8 MG Caps capsule Commonly known as:  RAPAFLO Take 8 mg by mouth at bedtime.      Contact information for after-discharge care    Destination    [facility] .   Specialty:  [place] information: [place] [phone]             Allergies  Allergen Reactions  . Nsaids Nausea Only    Reaction:  GI upset   . Ibuprofen Other (See Comments)    Reaction:  GI upset   . Ace Inhibitors Cough    Consultations:  Cardiology   Palliative   Procedures/Studies:  Ct Abdomen Pelvis Wo Contrast  Result Date: 08/14/2016 CLINICAL DATA:  C. difficile colitis.  Hx renal artery stenosis, peptic ulcer, ischemic colitis, internal hemorrhoids hiatal hernia, GERD, gastroparesis, diabetes, transurethral resection of prostate, partial gastrectomy, inguinal hernia repair and mesh insertion. EXAM: CT ABDOMEN AND PELVIS WITHOUT CONTRAST TECHNIQUE: Multidetector CT imaging of the abdomen and pelvis was performed following the standard protocol without IV contrast. COMPARISON:  06/26/2016 FINDINGS: Lower chest: There are bilateral pleural effusions. Bibasilar atelectasis is noted. Mild reticular changes identified along anterior right lower lobe. Status post median sternotomy. There is dense atherosclerotic calcification of the coronary vessels. Heart size is mildly enlarged. Hepatobiliary: Stable appearance of low-attenuation lesions in the liver, most likely representing cysts. Status post cholecystectomy. Pancreas: Poorly defined.  No acute abnormality. Spleen: Normal in size without focal abnormality. Adrenals/Urinary Tract: Adrenal glands are unremarkable. Kidneys are normal, without renal calculi, focal lesion, or hydronephrosis. Bladder is unremarkable. Stomach/Bowel: Stomach has a normal appearance. There is dilatation of small bowel loops. There is marked abnormality of the colon. The colonic wall is markedly thickened and irregular throughout its entire course. The changes have progressed since the prior study. Contrast reaches the rectum at time of exam, excluding complete obstruction. Vascular/Lymphatic: Dense atherosclerotic calcification of the abdominal aorta and its branches. Reproductive: Prostatic calcifications are present. Other: There is diffuse body wall edema which has progressed since the prior study. Musculoskeletal: No acute or significant osseous findings. IMPRESSION: 1. Interval progression of significant irregular thickening of the colonic wall consistent with the history of Celsius diff colitis. There is no evidence for perforation at this time. 2.  Bilateral pleural effusions. 3. Status post cholecystectomy. 4. Small bowel dilatation possibly related to ileus. No evidence for obstruction. 5. Advanced atherosclerotic calcification of the abdominal aorta and its branches. Electronically Signed   By: Nolon Nations M.D.   On: 08/14/2016 21:56   Dg Chest 1 View  Result Date: 08/17/2016 CLINICAL DATA:  Acute renal failure EXAM: CHEST 1 VIEW COMPARISON:  08/14/2016 FINDINGS: Post sternotomy changes are again visualized. There is mild cardiomegaly. Development of small bilateral effusions and hazy bibasilar atelectasis or infiltrate. Atherosclerosis. No pneumothorax. IMPRESSION: 1. Development of small bilateral pleural effusions with increased hazy bibasilar atelectasis or infiltrate 2. Cardiomegaly Electronically Signed   By: Donavan Foil M.D.   On: 08/17/2016 20:40   Dg Chest 2 View  Result Date: 08/11/2016 CLINICAL DATA:  Weakness, shortness of breath, chest pain EXAM: CHEST  2 VIEW COMPARISON:  Chest x-ray of 06/24/2016 FINDINGS: There is persistent basilar volume loss right greater than left, but aeration of the right lung base has improved. Small bilateral effusions remain, perhaps slightly decreased in volume. No pneumonia is seen. Cardiomegaly is stable. No acute  bony abnormality is noted. IMPRESSION: 1. Better aeration with decreasing right basilar atelectasis. 2. Slight decrease in size of small bilateral pleural effusions. Electronically Signed   By: Ivar Drape M.D.   On: 08/11/2016 14:19   US Renal  Result Date: 08/17/2016 CLINICAL DATA:  Acute renal failure EXAM: RENAL / URINARY TRACT ULTRASOUND COMPLETE COMPARISON:  CT 08/14/2016, 06/30/2016 FINDINGS: Right Kidney: Length: 11.1 cm. Diffuse increased echogenicity. No hydronephrosis. The previously noted cyst is not well identified on the current exam Left Kidney: Length: 10.9 cm. Diffuse increased echogenicity. No hydronephrosis. Bladder: Appears normal for degree of bladder distention.  Incidentally noted is a small amount of abdominal ascites IMPRESSION: 1. Echogenic kidneys consistent with medical renal disease. No hydronephrosis. 2. Small volume of abdominal ascites Electronically Signed   By: Donavan Foil M.D.   On: 08/17/2016 20:39   Dg Chest Port 1 View  Result Date: 08/14/2016 CLINICAL DATA:  81 year old male with acute on chronic heart failure. Wheezing and lethargy. Initial encounter. EXAM: PORTABLE CHEST 1 VIEW COMPARISON:  08/12/2016 and earlier. FINDINGS: Portable AP semi upright view at 0924 hours. Mildly improved lung volumes and bibasilar ventilation. Stable cardiomegaly and mediastinal contours. Calcified aortic atherosclerosis. No pneumothorax or pulmonary edema. No definite pleural effusion or consolidation. Residual patchy bibasilar opacity most resembles atelectasis. Negative visible bowel gas pattern. IMPRESSION: Improved lung volumes with regressed bibasilar atelectasis. Stable cardiomegaly and Calcified aortic atherosclerosis. No pulmonary edema. Electronically Signed   By: Genevie Ann M.D.   On: 08/14/2016 09:36   Dg Chest Port 1 View  Result Date: 08/12/2016 CLINICAL DATA:  Dyspnea, failure to thrive, history of atrial fibrillation, CHF, peripheral vascular disease, diabetes. EXAM: PORTABLE CHEST 1 VIEW COMPARISON:  PA and lateral chest x-ray of August 11, 2016 FINDINGS: The right lung is less well inflated today. Inflation of the left lung is reasonably well maintained. There is increased retrocardiac density on the left with obscuration of the hemidiaphragm. There has been previous CABG. The cardiac silhouette is enlarged. The pulmonary vascularity is mildly engorged. There is calcification in the wall of the aortic arch. The sternal wires are intact. The bony thorax exhibits no acute abnormality. IMPRESSION: Increasing density at the left lung base worrisome for atelectasis or pneumonia. Probable mild atelectatic change in the hypo inflated right lung. Cardiomegaly  with central pulmonary vascular congestion slightly more conspicuous today. Thoracic aortic atherosclerosis. Electronically Signed   By: David  Martinique M.D.   On: 08/12/2016 07:39     Subjective: - very lethargic  Discharge Exam: Vitals:   08/19/16 0737 08/19/16 1203  BP: 117/60 130/61  Pulse: 85 87  Resp: 20   Temp:  98.7 F (37.1 C)   Vitals:   08/18/16 2047 08/19/16 0600 08/19/16 0737 08/19/16 1203  BP: 126/61 (!) 136/58 117/60 130/61  Pulse: 68 82 85 87  Resp: 16 16 20    Temp: 98.6 F (37 C) 98.4 F (36.9 C)  98.7 F (37.1 C)  TempSrc: Oral Oral  Oral  SpO2: 100% 100% 100% 100%  Weight:  85 kg (187 lb 8 oz)    Height:        General: lethargic    The results of significant diagnostics from this hospitalization (including imaging, microbiology, ancillary and laboratory) are listed below for reference.     Microbiology: Recent Results (from the past 240 hour(s))  MRSA PCR Screening     Status: Abnormal   Collection Time: 08/11/16  9:25 PM  Result Value Ref Range Status  MRSA by PCR POSITIVE (A) NEGATIVE Final    Comment:        The GeneXpert MRSA Assay (FDA approved for NASAL specimens only), is one component of a comprehensive MRSA colonization surveillance program. It is not intended to diagnose MRSA infection nor to guide or monitor treatment for MRSA infections. RESULT CALLED TO, READ BACK BY AND VERIFIED WITH: E.GARNETT,RN AT 2329 BY L.PITT 08/11/16   C difficile quick scan w PCR reflex     Status: Abnormal   Collection Time: 08/12/16  5:52 PM  Result Value Ref Range Status   C Diff antigen POSITIVE (A) NEGATIVE Final   C Diff toxin POSITIVE (A) NEGATIVE Final   C Diff interpretation Toxin producing C. difficile detected.  Final    Comment: CRITICAL RESULT CALLED TO, READ BACK BY AND VERIFIED WITH: Nilda Riggs RN [id number] 08/12/16 A BROWNING   Culture, Urine     Status: Abnormal   Collection Time: 08/14/16 12:23 PM  Result Value Ref Range  Status   Specimen Description URINE, RANDOM  Final   Special Requests Oral vanc  Final   Culture (A)  Final    >=100,000 COLONIES/mL METHICILLIN RESISTANT STAPHYLOCOCCUS AUREUS   Report Status 08/16/2016 FINAL  Final   Organism ID, Bacteria METHICILLIN RESISTANT STAPHYLOCOCCUS AUREUS (A)  Final      Susceptibility   Methicillin resistant staphylococcus aureus - MIC*    CIPROFLOXACIN >=8 RESISTANT Resistant     GENTAMICIN <=0.5 SENSITIVE Sensitive     NITROFURANTOIN <=16 SENSITIVE Sensitive     OXACILLIN >=4 RESISTANT Resistant     TETRACYCLINE <=1 SENSITIVE Sensitive     VANCOMYCIN 1 SENSITIVE Sensitive     TRIMETH/SULFA >=320 RESISTANT Resistant     CLINDAMYCIN <=0.25 SENSITIVE Sensitive     RIFAMPIN <=0.5 SENSITIVE Sensitive     Inducible Clindamycin NEGATIVE Sensitive     * >=100,000 COLONIES/mL METHICILLIN RESISTANT STAPHYLOCOCCUS AUREUS  Respiratory Panel by PCR     Status: None   Collection Time: 08/14/16 12:23 PM  Result Value Ref Range Status   Adenovirus NOT DETECTED NOT DETECTED Final   Coronavirus 229E NOT DETECTED NOT DETECTED Final   Coronavirus HKU1 NOT DETECTED NOT DETECTED Final   Coronavirus NL63 NOT DETECTED NOT DETECTED Final   Coronavirus OC43 NOT DETECTED NOT DETECTED Final   Metapneumovirus NOT DETECTED NOT DETECTED Final   Rhinovirus / Enterovirus NOT DETECTED NOT DETECTED Final   Influenza A NOT DETECTED NOT DETECTED Final   Influenza B NOT DETECTED NOT DETECTED Final   Parainfluenza Virus 1 NOT DETECTED NOT DETECTED Final   Parainfluenza Virus 2 NOT DETECTED NOT DETECTED Final   Parainfluenza Virus 3 NOT DETECTED NOT DETECTED Final   Parainfluenza Virus 4 NOT DETECTED NOT DETECTED Final   Respiratory Syncytial Virus NOT DETECTED NOT DETECTED Final   Bordetella pertussis NOT DETECTED NOT DETECTED Final   Chlamydophila pneumoniae NOT DETECTED NOT DETECTED Final   Mycoplasma pneumoniae NOT DETECTED NOT DETECTED Final  Culture, blood (Routine X 2) w Reflex  to ID Panel     Status: Abnormal   Collection Time: 08/14/16  1:45 PM  Result Value Ref Range Status   Specimen Description BLOOD RIGHT ANTECUBITAL  Final   Special Requests BOTTLES DRAWN AEROBIC AND ANAEROBIC 5CC  Final   Culture  Setup Time   Final    GRAM POSITIVE COCCI IN CLUSTERS IN BOTH AEROBIC AND ANAEROBIC BOTTLES CRITICAL RESULT CALLED TO, READ BACK BY AND VERIFIED WITH: E MARTIN,PHARMD AT  1013 08/15/16 BY L BENFIELD    Culture METHICILLIN RESISTANT STAPHYLOCOCCUS AUREUS (A)  Final   Report Status 08/17/2016 FINAL  Final   Organism ID, Bacteria METHICILLIN RESISTANT STAPHYLOCOCCUS AUREUS  Final      Susceptibility   Methicillin resistant staphylococcus aureus - MIC*    CIPROFLOXACIN >=8 RESISTANT Resistant     ERYTHROMYCIN >=8 RESISTANT Resistant     GENTAMICIN <=0.5 SENSITIVE Sensitive     OXACILLIN >=4 RESISTANT Resistant     TETRACYCLINE <=1 SENSITIVE Sensitive     VANCOMYCIN <=0.5 SENSITIVE Sensitive     TRIMETH/SULFA >=320 RESISTANT Resistant     CLINDAMYCIN <=0.25 SENSITIVE Sensitive     RIFAMPIN <=0.5 SENSITIVE Sensitive     Inducible Clindamycin NEGATIVE Sensitive     * METHICILLIN RESISTANT STAPHYLOCOCCUS AUREUS  Blood Culture ID Panel (Reflexed)     Status: Abnormal   Collection Time: 08/14/16  1:45 PM  Result Value Ref Range Status   Enterococcus species NOT DETECTED NOT DETECTED Final   Listeria monocytogenes NOT DETECTED NOT DETECTED Final   Staphylococcus species DETECTED (A) NOT DETECTED Final    Comment: CRITICAL RESULT CALLED TO, READ BACK BY AND VERIFIED WITH: E MARTIN,PHARMD AT 1013 08/15/16 BY L BENFIELD    Staphylococcus aureus DETECTED (A) NOT DETECTED Final    Comment: Methicillin (oxacillin)-resistant Staphylococcus aureus (MRSA). MRSA is predictably resistant to beta-lactam antibiotics (except ceftaroline). Preferred therapy is vancomycin unless clinically contraindicated. Patient requires contact precautions if  hospitalized. CRITICAL RESULT CALLED  TO, READ BACK BY AND VERIFIED WITH: E MARTIN,PHARMD AT 1013 08/15/16 BY L BENFIELD    Methicillin resistance DETECTED (A) NOT DETECTED Final    Comment: CRITICAL RESULT CALLED TO, READ BACK BY AND VERIFIED WITH: E MARTIN,PHARMD AT 1013 08/15/16 BY L BENFIELD    Streptococcus species NOT DETECTED NOT DETECTED Final   Streptococcus agalactiae NOT DETECTED NOT DETECTED Final   Streptococcus pneumoniae NOT DETECTED NOT DETECTED Final   Streptococcus pyogenes NOT DETECTED NOT DETECTED Final   Acinetobacter baumannii NOT DETECTED NOT DETECTED Final   Enterobacteriaceae species NOT DETECTED NOT DETECTED Final   Enterobacter cloacae complex NOT DETECTED NOT DETECTED Final   Escherichia coli NOT DETECTED NOT DETECTED Final   Klebsiella oxytoca NOT DETECTED NOT DETECTED Final   Klebsiella pneumoniae NOT DETECTED NOT DETECTED Final   Proteus species NOT DETECTED NOT DETECTED Final   Serratia marcescens NOT DETECTED NOT DETECTED Final   Haemophilus influenzae NOT DETECTED NOT DETECTED Final   Neisseria meningitidis NOT DETECTED NOT DETECTED Final   Pseudomonas aeruginosa NOT DETECTED NOT DETECTED Final   Candida albicans NOT DETECTED NOT DETECTED Final   Candida glabrata NOT DETECTED NOT DETECTED Final   Candida krusei NOT DETECTED NOT DETECTED Final   Candida parapsilosis NOT DETECTED NOT DETECTED Final   Candida tropicalis NOT DETECTED NOT DETECTED Final  Culture, blood (Routine X 2) w Reflex to ID Panel     Status: Abnormal   Collection Time: 08/14/16  1:55 PM  Result Value Ref Range Status   Specimen Description BLOOD BLOOD LEFT HAND  Final   Special Requests BOTTLES DRAWN AEROBIC AND ANAEROBIC 5CC  Final   Culture  Setup Time   Final    GRAM POSITIVE COCCI IN CLUSTERS IN BOTH AEROBIC AND ANAEROBIC BOTTLES CRITICAL RESULT CALLED TO, READ BACK BY AND VERIFIED WITH: E MARTIN,PHARMD AT 1013 08/15/16 BY L BENFIELD    Culture (A)  Final    STAPHYLOCOCCUS AUREUS SUSCEPTIBILITIES PERFORMED ON  PREVIOUS CULTURE WITHIN  THE LAST 5 DAYS.    Report Status 08/17/2016 FINAL  Final  Culture, blood (Routine X 2) w Reflex to ID Panel     Status: None (Preliminary result)   Collection Time: 08/15/16  2:14 PM  Result Value Ref Range Status   Specimen Description BLOOD RIGHT ANTECUBITAL  Final   Special Requests BOTTLES DRAWN AEROBIC AND ANAEROBIC 10 CC EACH  Final   Culture NO GROWTH 4 DAYS  Final   Report Status PENDING  Incomplete     Labs: BNP (last 3 results)  Recent Labs  08/11/16 1343 08/11/16 1850 08/14/16 1356  BNP 355.1* 319.0* [id number]*   Basic Metabolic Panel:  Recent Labs Lab 08/14/16 1356 08/15/16 0439 08/16/16 0322 08/17/16 1002 08/18/16 0538 08/19/16 0550  NA 134* 134* 135 135 135 134*  K 5.0 4.3 4.1 4.2 4.4 4.4  CL 104 103 104 102 103 102  CO2 20* 20* 19* 19* 19* 19*  GLUCOSE 234* 174* 133* 155* 125* 108*  BUN 102* 106* 109* 127* 130* 136*  CREATININE 3.01* 2.93* 3.29* 3.81* 4.19* 4.59*  CALCIUM 8.2* 8.0* 8.1* 8.5* 8.3* 8.2*  MG 1.3* 1.3* 1.7  --  1.6* 1.9  PHOS 2.6 3.3  --   --   --   --    Liver Function Tests:  Recent Labs Lab 08/14/16 1356 08/19/16 0550  AST 73* 48*  ALT 25 20  ALKPHOS 65 78  BILITOT 0.7 0.6  PROT 7.1 5.8*  ALBUMIN 1.8* 1.5*   No results for input(s): LIPASE, AMYLASE in the last 168 hours. No results for input(s): AMMONIA in the last 168 hours. CBC:  Recent Labs Lab 08/15/16 0439 08/16/16 0322 08/17/16 1002 08/18/16 0538 08/19/16 0550  WBC 15.1* 21.7* 22.6* 20.3* 13.7*  NEUTROABS 13.6*  --   --   --   --   HGB 6.8* 8.2* 7.9* 7.5* 7.7*  HCT 21.2* 24.8* 24.0* 22.5* 23.5*  MCV 83.1 82.9 81.6 80.6 81.6  PLT 235 233 273 283 281   Cardiac Enzymes:  Recent Labs Lab 08/19/16 0550  CKTOTAL 57   BNP: Invalid input(s): POCBNP CBG:  Recent Labs Lab 08/18/16 1203 08/18/16 1635 08/18/16 2148 08/19/16 0726 08/19/16 1202  GLUCAP 108* 123* 105* 120* 128*   D-Dimer No results for input(s): DDIMER in the last  72 hours. Hgb A1c No results for input(s): HGBA1C in the last 72 hours. Lipid Profile No results for input(s): CHOL, HDL, LDLCALC, TRIG, CHOLHDL, LDLDIRECT in the last 72 hours. Thyroid function studies No results for input(s): TSH, T4TOTAL, T3FREE, THYROIDAB in the last 72 hours.  Invalid input(s): FREET3 Anemia work up No results for input(s): VITAMINB12, FOLATE, FERRITIN, TIBC, IRON, RETICCTPCT in the last 72 hours. Urinalysis    Component Value Date/Time   COLORURINE YELLOW 08/11/2016 1556   APPEARANCEUR CLOUDY (A) 08/11/2016 1556   LABSPEC 1.010 08/11/2016 1556   LABSPEC 1.010 05/19/2015 1231   PHURINE 5.0 08/11/2016 1556   GLUCOSEU NEGATIVE 08/11/2016 1556   GLUCOSEU Negative 05/19/2015 1231   HGBUR MODERATE (A) 08/11/2016 1556   BILIRUBINUR NEGATIVE 08/11/2016 1556   BILIRUBINUR Negative 05/19/2015 1231   KETONESUR NEGATIVE 08/11/2016 1556   PROTEINUR 30 (A) 08/11/2016 1556   UROBILINOGEN 0.2 05/19/2015 1231   NITRITE NEGATIVE 08/11/2016 1556   LEUKOCYTESUR LARGE (A) 08/11/2016 1556   LEUKOCYTESUR Negative 05/19/2015 1231   Sepsis Labs Invalid input(s): PROCALCITONIN,  WBC,  LACTICIDVEN Microbiology Recent Results (from the past 240 hour(s))  MRSA PCR Screening  Status: Abnormal   Collection Time: 08/11/16  9:25 PM  Result Value Ref Range Status   MRSA by PCR POSITIVE (A) NEGATIVE Final    Comment:        The GeneXpert MRSA Assay (FDA approved for NASAL specimens only), is one component of a comprehensive MRSA colonization surveillance program. It is not intended to diagnose MRSA infection nor to guide or monitor treatment for MRSA infections. RESULT CALLED TO, READ BACK BY AND VERIFIED WITH: E.GARNETT,RN AT 2329 BY L.PITT 08/11/16   C difficile quick scan w PCR reflex     Status: Abnormal   Collection Time: 08/12/16  5:52 PM  Result Value Ref Range Status   C Diff antigen POSITIVE (A) NEGATIVE Final   C Diff toxin POSITIVE (A) NEGATIVE Final   C Diff  interpretation Toxin producing C. difficile detected.  Final    Comment: CRITICAL RESULT CALLED TO, READ BACK BY AND VERIFIED WITH: Nilda Riggs RN [id number] 08/12/16 A BROWNING   Culture, Urine     Status: Abnormal   Collection Time: 08/14/16 12:23 PM  Result Value Ref Range Status   Specimen Description URINE, RANDOM  Final   Special Requests Oral vanc  Final   Culture (A)  Final    >=100,000 COLONIES/mL METHICILLIN RESISTANT STAPHYLOCOCCUS AUREUS   Report Status 08/16/2016 FINAL  Final   Organism ID, Bacteria METHICILLIN RESISTANT STAPHYLOCOCCUS AUREUS (A)  Final      Susceptibility   Methicillin resistant staphylococcus aureus - MIC*    CIPROFLOXACIN >=8 RESISTANT Resistant     GENTAMICIN <=0.5 SENSITIVE Sensitive     NITROFURANTOIN <=16 SENSITIVE Sensitive     OXACILLIN >=4 RESISTANT Resistant     TETRACYCLINE <=1 SENSITIVE Sensitive     VANCOMYCIN 1 SENSITIVE Sensitive     TRIMETH/SULFA >=320 RESISTANT Resistant     CLINDAMYCIN <=0.25 SENSITIVE Sensitive     RIFAMPIN <=0.5 SENSITIVE Sensitive     Inducible Clindamycin NEGATIVE Sensitive     * >=100,000 COLONIES/mL METHICILLIN RESISTANT STAPHYLOCOCCUS AUREUS  Respiratory Panel by PCR     Status: None   Collection Time: 08/14/16 12:23 PM  Result Value Ref Range Status   Adenovirus NOT DETECTED NOT DETECTED Final   Coronavirus 229E NOT DETECTED NOT DETECTED Final   Coronavirus HKU1 NOT DETECTED NOT DETECTED Final   Coronavirus NL63 NOT DETECTED NOT DETECTED Final   Coronavirus OC43 NOT DETECTED NOT DETECTED Final   Metapneumovirus NOT DETECTED NOT DETECTED Final   Rhinovirus / Enterovirus NOT DETECTED NOT DETECTED Final   Influenza A NOT DETECTED NOT DETECTED Final   Influenza B NOT DETECTED NOT DETECTED Final   Parainfluenza Virus 1 NOT DETECTED NOT DETECTED Final   Parainfluenza Virus 2 NOT DETECTED NOT DETECTED Final   Parainfluenza Virus 3 NOT DETECTED NOT DETECTED Final   Parainfluenza Virus 4 NOT DETECTED NOT  DETECTED Final   Respiratory Syncytial Virus NOT DETECTED NOT DETECTED Final   Bordetella pertussis NOT DETECTED NOT DETECTED Final   Chlamydophila pneumoniae NOT DETECTED NOT DETECTED Final   Mycoplasma pneumoniae NOT DETECTED NOT DETECTED Final  Culture, blood (Routine X 2) w Reflex to ID Panel     Status: Abnormal   Collection Time: 08/14/16  1:45 PM  Result Value Ref Range Status   Specimen Description BLOOD RIGHT ANTECUBITAL  Final   Special Requests BOTTLES DRAWN AEROBIC AND ANAEROBIC 5CC  Final   Culture  Setup Time   Final    GRAM POSITIVE COCCI IN CLUSTERS IN  BOTH AEROBIC AND ANAEROBIC BOTTLES CRITICAL RESULT CALLED TO, READ BACK BY AND VERIFIED WITH: E MARTIN,PHARMD AT 1013 08/15/16 BY L BENFIELD    Culture METHICILLIN RESISTANT STAPHYLOCOCCUS AUREUS (A)  Final   Report Status 08/17/2016 FINAL  Final   Organism ID, Bacteria METHICILLIN RESISTANT STAPHYLOCOCCUS AUREUS  Final      Susceptibility   Methicillin resistant staphylococcus aureus - MIC*    CIPROFLOXACIN >=8 RESISTANT Resistant     ERYTHROMYCIN >=8 RESISTANT Resistant     GENTAMICIN <=0.5 SENSITIVE Sensitive     OXACILLIN >=4 RESISTANT Resistant     TETRACYCLINE <=1 SENSITIVE Sensitive     VANCOMYCIN <=0.5 SENSITIVE Sensitive     TRIMETH/SULFA >=320 RESISTANT Resistant     CLINDAMYCIN <=0.25 SENSITIVE Sensitive     RIFAMPIN <=0.5 SENSITIVE Sensitive     Inducible Clindamycin NEGATIVE Sensitive     * METHICILLIN RESISTANT STAPHYLOCOCCUS AUREUS  Blood Culture ID Panel (Reflexed)     Status: Abnormal   Collection Time: 08/14/16  1:45 PM  Result Value Ref Range Status   Enterococcus species NOT DETECTED NOT DETECTED Final   Listeria monocytogenes NOT DETECTED NOT DETECTED Final   Staphylococcus species DETECTED (A) NOT DETECTED Final    Comment: CRITICAL RESULT CALLED TO, READ BACK BY AND VERIFIED WITH: E MARTIN,PHARMD AT 1013 08/15/16 BY L BENFIELD    Staphylococcus aureus DETECTED (A) NOT DETECTED Final     Comment: Methicillin (oxacillin)-resistant Staphylococcus aureus (MRSA). MRSA is predictably resistant to beta-lactam antibiotics (except ceftaroline). Preferred therapy is vancomycin unless clinically contraindicated. Patient requires contact precautions if  hospitalized. CRITICAL RESULT CALLED TO, READ BACK BY AND VERIFIED WITH: E MARTIN,PHARMD AT 1013 08/15/16 BY L BENFIELD    Methicillin resistance DETECTED (A) NOT DETECTED Final    Comment: CRITICAL RESULT CALLED TO, READ BACK BY AND VERIFIED WITH: E MARTIN,PHARMD AT 1013 08/15/16 BY L BENFIELD    Streptococcus species NOT DETECTED NOT DETECTED Final   Streptococcus agalactiae NOT DETECTED NOT DETECTED Final   Streptococcus pneumoniae NOT DETECTED NOT DETECTED Final   Streptococcus pyogenes NOT DETECTED NOT DETECTED Final   Acinetobacter baumannii NOT DETECTED NOT DETECTED Final   Enterobacteriaceae species NOT DETECTED NOT DETECTED Final   Enterobacter cloacae complex NOT DETECTED NOT DETECTED Final   Escherichia coli NOT DETECTED NOT DETECTED Final   Klebsiella oxytoca NOT DETECTED NOT DETECTED Final   Klebsiella pneumoniae NOT DETECTED NOT DETECTED Final   Proteus species NOT DETECTED NOT DETECTED Final   Serratia marcescens NOT DETECTED NOT DETECTED Final   Haemophilus influenzae NOT DETECTED NOT DETECTED Final   Neisseria meningitidis NOT DETECTED NOT DETECTED Final   Pseudomonas aeruginosa NOT DETECTED NOT DETECTED Final   Candida albicans NOT DETECTED NOT DETECTED Final   Candida glabrata NOT DETECTED NOT DETECTED Final   Candida krusei NOT DETECTED NOT DETECTED Final   Candida parapsilosis NOT DETECTED NOT DETECTED Final   Candida tropicalis NOT DETECTED NOT DETECTED Final  Culture, blood (Routine X 2) w Reflex to ID Panel     Status: Abnormal   Collection Time: 08/14/16  1:55 PM  Result Value Ref Range Status   Specimen Description BLOOD BLOOD LEFT HAND  Final   Special Requests BOTTLES DRAWN AEROBIC AND ANAEROBIC 5CC   Final   Culture  Setup Time   Final    GRAM POSITIVE COCCI IN CLUSTERS IN BOTH AEROBIC AND ANAEROBIC BOTTLES CRITICAL RESULT CALLED TO, READ BACK BY AND VERIFIED WITH: E MARTIN,PHARMD AT 1013 08/15/16 BY L BENFIELD  Culture (A)  Final    STAPHYLOCOCCUS AUREUS SUSCEPTIBILITIES PERFORMED ON PREVIOUS CULTURE WITHIN THE LAST 5 DAYS.    Report Status 08/17/2016 FINAL  Final  Culture, blood (Routine X 2) w Reflex to ID Panel     Status: None (Preliminary result)   Collection Time: 08/15/16  2:14 PM  Result Value Ref Range Status   Specimen Description BLOOD RIGHT ANTECUBITAL  Final   Special Requests BOTTLES DRAWN AEROBIC AND ANAEROBIC 10 CC EACH  Final   Culture NO GROWTH 4 DAYS  Final   Report Status PENDING  Incomplete     Time coordinating discharge: 25 minutes  SIGNED:  Marzetta Board, MD  [facility] 08/19/2016, 3:27 PM Pager [phone]  If 7PM-7AM, please contact night-coverage [email or web address] Password [email or web address]

## 2016-08-19 NOTE — Progress Notes (Signed)
Pt. With critical INR this am of 6.10. MD for [facility] paged.

## 2016-08-19 NOTE — Progress Notes (Signed)
ANTICOAGULATION CONSULT NOTE - Follow Up Consult  Pharmacy Consult for Coumadin Indication: afib and hx of DVT  Allergies  Allergen Reactions  . Nsaids Nausea Only    Reaction:  GI upset   . Ibuprofen Other (See Comments)    Reaction:  GI upset   . Ace Inhibitors Cough    Patient Measurements: Height: 5\' 10"  (177.8 cm) Weight: 187 lb 8 oz (85 kg) IBW/kg (Calculated) : 73   Vital Signs: Temp: 98.4 F (36.9 C) (02/08 0600) Temp Source: Oral (02/08 0600) BP: 117/60 (02/08 0737) Pulse Rate: 85 (02/08 0737)  Labs:  Recent Labs  08/17/16 1002 08/18/16 0538 08/19/16 0550  HGB 7.9* 7.5* 7.7*  HCT 24.0* 22.5* 23.5*  PLT 273 283 281  LABPROT 57.9* 54.1* 56.0*  INR 6.34* 5.83* 6.10*  CREATININE 3.81* 4.19* 4.59*  CKTOTAL  --   --  57    Estimated Creatinine Clearance: 13.7 mL/min (by C-G formula based on SCr of 4.59 mg/dL (H)).  Assessment: 80yom continues on coumadin for afib and hx DVT.  INR elevated today at 6.10, most likely due to Flagyl interaction / diarrhea No bleeding noted  PTA dose: 5mg  daily except 6mg  on Sun  Goal of Therapy:  INR 2-3 Monitor platelets by anticoagulation protocol: Yes   Plan:  Continue to hold Coumadin Daily INR  Thank you Anette Guarneri, PharmD [phone] 08/19/16 8:42 AM

## 2016-08-19 NOTE — Progress Notes (Signed)
Multiple family members present for palliative care meeting.  Patient's wife, some children, and some grandchildren present.

## 2016-08-19 NOTE — Progress Notes (Deleted)
PROGRESS NOTE  [name] [id number] DOB: [birth year] DOA: 08/11/2016 PCP: [name], MD   LOS: 8 days   Brief Narrative: 80 y.o.malesignificant for atrial fibrillation, coronary artery disease, CK D, diabetes, gastroparesis, GERD, gout, hypertension. Patient was originally admitted by cardiology on the 31st for weakness and fatigue thought to be due to congestive heart failure. Patient had a fever overnight 102. Patient is still very tired and weak per nursing. No other acute events overnight.  Assessment & Plan: Active Problems:   Diabetes mellitus (HCC)   CKD (chronic kidney disease), stage IV (HCC)   Anemia of chronic disease   CAD of autologous bypass graft   Acute renal failure superimposed on stage 4 chronic kidney disease (HCC)   HCAP (healthcare-associated pneumonia)   C. difficile colitis   Acute on chronic diastolic CHF (congestive heart failure) (HCC)   Warfarin anticoagulation   Failure to thrive (0-17)   Malnutrition of moderate degree   Palliative care by specialist   AKI (acute kidney injury) ([place])   MRSA infection   DNR (do not resuscitate)   Shortness of breath  Sepsis secondary to MRSA bacteremia, Staph aureus UTI, C. Diff - started vancomycin IV 2/4, ID team consulted and assistance is appreciated  - zosyn stopped per ID team as no indication to continue and pt also with C. Diff - no plan to obtain TEE as pt with rather guarded prognosis and determined not to be a candidate for aggressive interventions  - repeat blood cultures done 2/4, so far no growth  - WBC improving - CBC in AM - antibiotics changed to Daptomycin instead of Vancomycin per ID due to renal failure, end date 09/12/16, total of 4 weeks, but now on comfot  - goals transition to comfort, hospice facility placement pending  Recurrent C diff - per ID, stop IV Flagyl and continue only PO Vancomycin, will need two weeks therapy - goals transition to comfort, hospice  facility placement pending  Staph aureus UTI - on Daptomycin - goals transition to comfort, hospice facility placement pending  Acute respiratory failure   - stable  Chronic diastolic CHF - with persistent LE edema  - stopped IVF 2/4, lasix also held due to worsening Cr, up from 2.93 --> 3.29 --> 3.81 >> 4.2 however Lasix resumed 2/7. Cr still worsening. Hold Lasix this pm, only gave then am dose.  - cardiology singed off as no further recommendations   Hypotension - with SBP in 80s this AM - stable  CAD - S/p CABG and redo CABG. No chest pain.  - Trop mildly elevated w/ flat trend - Suspect demand ischemia in the setting of diast CHF/generalized illness - no aspirin as pt already on Coumadin per cardiology  - no plan for further ischemic work up   PAF, CHADS 2 score 5 - Cont amio/?blocker/coumadin - on tele monitor   Right Upper Ext DVT - On coumadin - INR 5.25 --> 6.34 >> 5.8 - currently no signs of active bleeding  Hypokalemia  - supplemented  Hypomagnesemia - supplemented  Normocytic anemia - Hg drop from 7.4 --> 6.8 -->  8.2 --> 7.9 - s/p 1 U PRBC transfusion 2/4 - CBC in AM  Hypothyroidism - TSH 32.676 on admission 1/31. This was up from 22.371 on 12/15 - currently on synthroid 75 mcg QD   Acute renal failure superimposed on stage 4 chronic kidney disease ([place]) - holding on IVF as pt with more LE edema - no lasix either  as Cr continues trending up - also worrisome for giving Vancomycin, will obtain renal US - paged ID to discuss vancomycin and possible change in ABX regimen given progressive renal injury - BMP in AM  Moderate PCM - appreciate nutritionist recommendations   Decubitus ulcer, sacral area, also bilateral heals - Sacrum with unstageable pressure injury; 6X6cm, 10% red, 90% tightly adhered yellow slough. - Pt is frequently incontinent and it is difficult to keep the wound from becoming soiled. - Right outer ankle with  previously noted unstageable pressure injury; it has evolved into stage 2 pressure injury during this assessment. .2X.2X.1cm, pink and dry, no odor or drainage - Right heel with clear fluid filled intact blister; stage 2 pressure injury; 1X2cm - Left outer heel with the same appearance; .5X3cm - awaiting Hospice placement - Santyl ointment for enzymatic debridement of nonviable tissue to sacrum. Float heels and air mattress to reduce pressure, foam dresssings to protect bilat heels and right ankle from further injury.  Acute functional quadriplegia - in the setting of multiple acute medical conditions and co morbidities - family hopeful for SNF discharge with hospice however, prognosis guarded  - pt is poorly responsive to current medical care, Cr trending up, vancomycin IV on board but worsening renal failure may require change in ABX, if we have to change to more broad ABX there is a high concern of C. Diff relapse when it has not gotten a change to be treated fully - will continue our effort in helping pt recover but as noted above prognosis is guarded. D/w son at bedside   DVT prophylaxis: Coumadin Code Status: DNR Family Communication: d/w son bedside Disposition Plan: TBD  Consultants:   Palliative  Cardiology   Procedures:   None   Antimicrobials:  Vancomycin IV 2/4 >> plan end date 3/4  Vancomycin po 2/2 >> plan end date 2/16  Subjective: - Poorly responsive, alert and oriented 4 and open his eyes when asked however very lethargic  Objective: Vitals:   08/18/16 2047 08/19/16 0600 08/19/16 0737 08/19/16 1203  BP: 126/61 (!) 136/58 117/60 130/61  Pulse: 68 82 85 87  Resp: 16 16 20    Temp: 98.6 F (37 C) 98.4 F (36.9 C)  98.7 F (37.1 C)  TempSrc: Oral Oral  Oral  SpO2: 100% 100% 100% 100%  Weight:  85 kg (187 lb 8 oz)    Height:        Intake/Output Summary (Last 24 hours) at 08/19/16 1312 Last data filed at 08/19/16 1303  Gross per 24 hour  Intake               945 ml  Output                0 ml  Net              945 ml   Filed Weights   08/17/16 0532 08/18/16 0630 08/19/16 0600  Weight: 92.5 kg (203 lb 14.4 oz) 94 kg (207 lb 4.8 oz) 85 kg (187 lb 8 oz)    Examination: Constitutional: lethargic  Vitals:   08/18/16 2047 08/19/16 0600 08/19/16 0737 08/19/16 1203  BP: 126/61 (!) 136/58 117/60 130/61  Pulse: 68 82 85 87  Resp: 16 16 20    Temp: 98.6 F (37 C) 98.4 F (36.9 C)  98.7 F (37.1 C)  TempSrc: Oral Oral  Oral  SpO2: 100% 100% 100% 100%  Weight:  85 kg (187 lb 8 oz)    Height:  Neck: normal, supple, no masses Respiratory: clear to auscultation bilaterally, no wheezing, no crackles. Poor respiratory effort Cardiovascular: Regular rate and rhythm, no murmurs / rubs / gallops. 2+ LE edema.  Abdomen: no tenderness. Bowel sounds positive.  Musculoskeletal: no clubbing / cyanosis.  Skin: no rashes, lesions, ulcers. No induration  Data Reviewed: I have personally reviewed following labs and imaging studies  CBC:  Recent Labs Lab 08/15/16 0439 08/16/16 0322 08/17/16 1002 08/18/16 0538 08/19/16 0550  WBC 15.1* 21.7* 22.6* 20.3* 13.7*  NEUTROABS 13.6*  --   --   --   --   HGB 6.8* 8.2* 7.9* 7.5* 7.7*  HCT 21.2* 24.8* 24.0* 22.5* 23.5*  MCV 83.1 82.9 81.6 80.6 81.6  PLT 235 233 273 283 [id number]   Basic Metabolic Panel:  Recent Labs Lab 08/14/16 1356 08/15/16 0439 08/16/16 0322 08/17/16 1002 08/18/16 0538 08/19/16 0550  NA 134* 134* 135 135 135 134*  K 5.0 4.3 4.1 4.2 4.4 4.4  CL 104 103 104 102 103 102  CO2 20* 20* 19* 19* 19* 19*  GLUCOSE 234* 174* 133* 155* 125* 108*  BUN 102* 106* 109* 127* 130* 136*  CREATININE 3.01* 2.93* 3.29* 3.81* 4.19* 4.59*  CALCIUM 8.2* 8.0* 8.1* 8.5* 8.3* 8.2*  MG 1.3* 1.3* 1.7  --  1.6* 1.9  PHOS 2.6 3.3  --   --   --   --    GFR: Estimated Creatinine Clearance: 13.7 mL/min (by C-G formula based on SCr of 4.59 mg/dL (H)). Liver Function Tests:  Recent Labs Lab  08/14/16 1356 08/19/16 0550  AST 73* 48*  ALT 25 20  ALKPHOS 65 78  BILITOT 0.7 0.6  PROT 7.1 5.8*  ALBUMIN 1.8* 1.5*   No results for input(s): LIPASE, AMYLASE in the last 168 hours. No results for input(s): AMMONIA in the last 168 hours. Coagulation Profile:  Recent Labs Lab 08/14/16 1356 08/16/16 0322 08/17/16 1002 08/18/16 0538 08/19/16 0550  INR 2.97 5.25* 6.34* 5.83* 6.10*   Cardiac Enzymes:  Recent Labs Lab 08/19/16 0550  CKTOTAL 57   BNP (last 3 results) No results for input(s): PROBNP in the last 8760 hours. HbA1C: No results for input(s): HGBA1C in the last 72 hours. CBG:  Recent Labs Lab 08/18/16 1203 08/18/16 1635 08/18/16 2148 08/19/16 0726 08/19/16 1202  GLUCAP 108* 123* 105* 120* 128*   Lipid Profile: No results for input(s): CHOL, HDL, LDLCALC, TRIG, CHOLHDL, LDLDIRECT in the last 72 hours. Thyroid Function Tests: No results for input(s): TSH, T4TOTAL, FREET4, T3FREE, THYROIDAB in the last 72 hours. Anemia Panel: No results for input(s): VITAMINB12, FOLATE, FERRITIN, TIBC, IRON, RETICCTPCT in the last 72 hours. Urine analysis:    Component Value Date/Time   COLORURINE YELLOW 08/11/2016 1556   APPEARANCEUR CLOUDY (A) 08/11/2016 1556   LABSPEC 1.010 08/11/2016 1556   LABSPEC 1.010 05/19/2015 1231   PHURINE 5.0 08/11/2016 1556   GLUCOSEU NEGATIVE 08/11/2016 1556   GLUCOSEU Negative 05/19/2015 1231   HGBUR MODERATE (A) 08/11/2016 1556   BILIRUBINUR NEGATIVE 08/11/2016 1556   BILIRUBINUR Negative 05/19/2015 1231   KETONESUR NEGATIVE 08/11/2016 1556   PROTEINUR 30 (A) 08/11/2016 1556   UROBILINOGEN 0.2 05/19/2015 1231   NITRITE NEGATIVE 08/11/2016 1556   LEUKOCYTESUR LARGE (A) 08/11/2016 1556   LEUKOCYTESUR Negative 05/19/2015 1231   Sepsis Labs: Invalid input(s): PROCALCITONIN, LACTICIDVEN  Recent Results (from the past 240 hour(s))  MRSA PCR Screening     Status: Abnormal   Collection Time: 08/11/16  9:25 PM  Result  Value Ref  Range Status   MRSA by PCR POSITIVE (A) NEGATIVE Final    Comment:        The GeneXpert MRSA Assay (FDA approved for NASAL specimens only), is one component of a comprehensive MRSA colonization surveillance program. It is not intended to diagnose MRSA infection nor to guide or monitor treatment for MRSA infections. RESULT CALLED TO, READ BACK BY AND VERIFIED WITH: E.GARNETT,RN AT 2329 BY L.PITT 08/11/16   C difficile quick scan w PCR reflex     Status: Abnormal   Collection Time: 08/12/16  5:52 PM  Result Value Ref Range Status   C Diff antigen POSITIVE (A) NEGATIVE Final   C Diff toxin POSITIVE (A) NEGATIVE Final   C Diff interpretation Toxin producing C. difficile detected.  Final    Comment: CRITICAL RESULT CALLED TO, READ BACK BY AND VERIFIED WITH: Nilda Riggs RN [id number] 08/12/16 A BROWNING   Culture, Urine     Status: Abnormal   Collection Time: 08/14/16 12:23 PM  Result Value Ref Range Status   Specimen Description URINE, RANDOM  Final   Special Requests Oral vanc  Final   Culture (A)  Final    >=100,000 COLONIES/mL METHICILLIN RESISTANT STAPHYLOCOCCUS AUREUS   Report Status 08/16/2016 FINAL  Final   Organism ID, Bacteria METHICILLIN RESISTANT STAPHYLOCOCCUS AUREUS (A)  Final      Susceptibility   Methicillin resistant staphylococcus aureus - MIC*    CIPROFLOXACIN >=8 RESISTANT Resistant     GENTAMICIN <=0.5 SENSITIVE Sensitive     NITROFURANTOIN <=16 SENSITIVE Sensitive     OXACILLIN >=4 RESISTANT Resistant     TETRACYCLINE <=1 SENSITIVE Sensitive     VANCOMYCIN 1 SENSITIVE Sensitive     TRIMETH/SULFA >=320 RESISTANT Resistant     CLINDAMYCIN <=0.25 SENSITIVE Sensitive     RIFAMPIN <=0.5 SENSITIVE Sensitive     Inducible Clindamycin NEGATIVE Sensitive     * >=100,000 COLONIES/mL METHICILLIN RESISTANT STAPHYLOCOCCUS AUREUS  Respiratory Panel by PCR     Status: None   Collection Time: 08/14/16 12:23 PM  Result Value Ref Range Status   Adenovirus NOT DETECTED  NOT DETECTED Final   Coronavirus 229E NOT DETECTED NOT DETECTED Final   Coronavirus HKU1 NOT DETECTED NOT DETECTED Final   Coronavirus NL63 NOT DETECTED NOT DETECTED Final   Coronavirus OC43 NOT DETECTED NOT DETECTED Final   Metapneumovirus NOT DETECTED NOT DETECTED Final   Rhinovirus / Enterovirus NOT DETECTED NOT DETECTED Final   Influenza A NOT DETECTED NOT DETECTED Final   Influenza B NOT DETECTED NOT DETECTED Final   Parainfluenza Virus 1 NOT DETECTED NOT DETECTED Final   Parainfluenza Virus 2 NOT DETECTED NOT DETECTED Final   Parainfluenza Virus 3 NOT DETECTED NOT DETECTED Final   Parainfluenza Virus 4 NOT DETECTED NOT DETECTED Final   Respiratory Syncytial Virus NOT DETECTED NOT DETECTED Final   Bordetella pertussis NOT DETECTED NOT DETECTED Final   Chlamydophila pneumoniae NOT DETECTED NOT DETECTED Final   Mycoplasma pneumoniae NOT DETECTED NOT DETECTED Final  Culture, blood (Routine X 2) w Reflex to ID Panel     Status: Abnormal   Collection Time: 08/14/16  1:45 PM  Result Value Ref Range Status   Specimen Description BLOOD RIGHT ANTECUBITAL  Final   Special Requests BOTTLES DRAWN AEROBIC AND ANAEROBIC 5CC  Final   Culture  Setup Time   Final    GRAM POSITIVE COCCI IN CLUSTERS IN BOTH AEROBIC AND ANAEROBIC BOTTLES CRITICAL RESULT CALLED TO, READ BACK BY  AND VERIFIED WITH: E MARTIN,PHARMD AT 1013 08/15/16 BY L BENFIELD    Culture METHICILLIN RESISTANT STAPHYLOCOCCUS AUREUS (A)  Final   Report Status 08/17/2016 FINAL  Final   Organism ID, Bacteria METHICILLIN RESISTANT STAPHYLOCOCCUS AUREUS  Final      Susceptibility   Methicillin resistant staphylococcus aureus - MIC*    CIPROFLOXACIN >=8 RESISTANT Resistant     ERYTHROMYCIN >=8 RESISTANT Resistant     GENTAMICIN <=0.5 SENSITIVE Sensitive     OXACILLIN >=4 RESISTANT Resistant     TETRACYCLINE <=1 SENSITIVE Sensitive     VANCOMYCIN <=0.5 SENSITIVE Sensitive     TRIMETH/SULFA >=320 RESISTANT Resistant     CLINDAMYCIN <=0.25  SENSITIVE Sensitive     RIFAMPIN <=0.5 SENSITIVE Sensitive     Inducible Clindamycin NEGATIVE Sensitive     * METHICILLIN RESISTANT STAPHYLOCOCCUS AUREUS  Blood Culture ID Panel (Reflexed)     Status: Abnormal   Collection Time: 08/14/16  1:45 PM  Result Value Ref Range Status   Enterococcus species NOT DETECTED NOT DETECTED Final   Listeria monocytogenes NOT DETECTED NOT DETECTED Final   Staphylococcus species DETECTED (A) NOT DETECTED Final    Comment: CRITICAL RESULT CALLED TO, READ BACK BY AND VERIFIED WITH: E MARTIN,PHARMD AT 1013 08/15/16 BY L BENFIELD    Staphylococcus aureus DETECTED (A) NOT DETECTED Final    Comment: Methicillin (oxacillin)-resistant Staphylococcus aureus (MRSA). MRSA is predictably resistant to beta-lactam antibiotics (except ceftaroline). Preferred therapy is vancomycin unless clinically contraindicated. Patient requires contact precautions if  hospitalized. CRITICAL RESULT CALLED TO, READ BACK BY AND VERIFIED WITH: E MARTIN,PHARMD AT 1013 08/15/16 BY L BENFIELD    Methicillin resistance DETECTED (A) NOT DETECTED Final    Comment: CRITICAL RESULT CALLED TO, READ BACK BY AND VERIFIED WITH: E MARTIN,PHARMD AT 1013 08/15/16 BY L BENFIELD    Streptococcus species NOT DETECTED NOT DETECTED Final   Streptococcus agalactiae NOT DETECTED NOT DETECTED Final   Streptococcus pneumoniae NOT DETECTED NOT DETECTED Final   Streptococcus pyogenes NOT DETECTED NOT DETECTED Final   Acinetobacter baumannii NOT DETECTED NOT DETECTED Final   Enterobacteriaceae species NOT DETECTED NOT DETECTED Final   Enterobacter cloacae complex NOT DETECTED NOT DETECTED Final   Escherichia coli NOT DETECTED NOT DETECTED Final   Klebsiella oxytoca NOT DETECTED NOT DETECTED Final   Klebsiella pneumoniae NOT DETECTED NOT DETECTED Final   Proteus species NOT DETECTED NOT DETECTED Final   Serratia marcescens NOT DETECTED NOT DETECTED Final   Haemophilus influenzae NOT DETECTED NOT DETECTED Final    Neisseria meningitidis NOT DETECTED NOT DETECTED Final   Pseudomonas aeruginosa NOT DETECTED NOT DETECTED Final   Candida albicans NOT DETECTED NOT DETECTED Final   Candida glabrata NOT DETECTED NOT DETECTED Final   Candida krusei NOT DETECTED NOT DETECTED Final   Candida parapsilosis NOT DETECTED NOT DETECTED Final   Candida tropicalis NOT DETECTED NOT DETECTED Final  Culture, blood (Routine X 2) w Reflex to ID Panel     Status: Abnormal   Collection Time: 08/14/16  1:55 PM  Result Value Ref Range Status   Specimen Description BLOOD BLOOD LEFT HAND  Final   Special Requests BOTTLES DRAWN AEROBIC AND ANAEROBIC 5CC  Final   Culture  Setup Time   Final    GRAM POSITIVE COCCI IN CLUSTERS IN BOTH AEROBIC AND ANAEROBIC BOTTLES CRITICAL RESULT CALLED TO, READ BACK BY AND VERIFIED WITH: E MARTIN,PHARMD AT 1013 08/15/16 BY L BENFIELD    Culture (A)  Final    STAPHYLOCOCCUS AUREUS  SUSCEPTIBILITIES PERFORMED ON PREVIOUS CULTURE WITHIN THE LAST 5 DAYS.    Report Status 08/17/2016 FINAL  Final  Culture, blood (Routine X 2) w Reflex to ID Panel     Status: None (Preliminary result)   Collection Time: 08/15/16  2:14 PM  Result Value Ref Range Status   Specimen Description BLOOD RIGHT ANTECUBITAL  Final   Special Requests BOTTLES DRAWN AEROBIC AND ANAEROBIC 10 CC EACH  Final   Culture NO GROWTH 4 DAYS  Final   Report Status PENDING  Incomplete      Radiology Studies: Dg Chest 1 View  Result Date: 08/17/2016 CLINICAL DATA:  Acute renal failure EXAM: CHEST 1 VIEW COMPARISON:  08/14/2016 FINDINGS: Post sternotomy changes are again visualized. There is mild cardiomegaly. Development of small bilateral effusions and hazy bibasilar atelectasis or infiltrate. Atherosclerosis. No pneumothorax. IMPRESSION: 1. Development of small bilateral pleural effusions with increased hazy bibasilar atelectasis or infiltrate 2. Cardiomegaly Electronically Signed   By: Donavan Foil M.D.   On: 08/17/2016 20:40   US  Renal  Result Date: 08/17/2016 CLINICAL DATA:  Acute renal failure EXAM: RENAL / URINARY TRACT ULTRASOUND COMPLETE COMPARISON:  CT 08/14/2016, 06/30/2016 FINDINGS: Right Kidney: Length: 11.1 cm. Diffuse increased echogenicity. No hydronephrosis. The previously noted cyst is not well identified on the current exam Left Kidney: Length: 10.9 cm. Diffuse increased echogenicity. No hydronephrosis. Bladder: Appears normal for degree of bladder distention. Incidentally noted is a small amount of abdominal ascites IMPRESSION: 1. Echogenic kidneys consistent with medical renal disease. No hydronephrosis. 2. Small volume of abdominal ascites Electronically Signed   By: Donavan Foil M.D.   On: 08/17/2016 20:39     Scheduled Meds: . amiodarone  100 mg Oral Daily  . carvedilol  3.125 mg Oral BID WC  . collagenase   Topical Daily  . DAPTOmycin (CUBICIN)  IV  750 mg Intravenous Q48H  . feeding supplement (ENSURE ENLIVE)  237 mL Oral BID BM  . insulin aspart  0-15 Units Subcutaneous TID WC  . isosorbide mononitrate  120 mg Oral Daily  . levothyroxine  50 mcg Oral QAC breakfast  . nutrition supplement (JUVEN)  1 packet Oral BID WC  . sodium chloride flush  3 mL Intravenous Q12H  . tamsulosin  0.4 mg Oral QPC supper  . vancomycin  125 mg Oral QID  . Warfarin - Pharmacist Dosing Inpatient   Does not apply q1800   Continuous Infusions:   Marzetta Board, MD, PhD [facility] Pager [phone]  If 7PM-7AM, please contact night-coverage [email or web address] Password [email or web address] 08/19/2016, 1:12 PM

## 2016-08-19 NOTE — Progress Notes (Signed)
Report called to Barbaraann Rondo at [facility] @ [phone].  AVS completed with next dose due medications listed.  Transport here to pick up patient and report given to them, including prn dilaudid 0.5mg  IV given at 1629.

## 2016-08-19 NOTE — Clinical Social Work Note (Addendum)
Per palliative request, referral made to [facility].  Dayton Scrape, [place] [phone]  3:25 pm Patient has been approved to discharge to [facility] today. MD, RN, and family aware.  Dayton Scrape, [place]

## 2016-08-20 LAB — CULTURE, BLOOD (ROUTINE X 2): Culture: NO GROWTH

## 2016-08-23 ENCOUNTER — Ambulatory Visit: Payer: Medicare Other

## 2016-08-23 ENCOUNTER — Telehealth: Payer: Self-pay | Admitting: Hematology and Oncology

## 2016-08-23 ENCOUNTER — Other Ambulatory Visit: Payer: Medicare Other

## 2016-08-23 NOTE — Telephone Encounter (Signed)
Needs to cancel appointment for today.  Patient is with Hospice now.

## 2016-09-09 DEATH — deceased

## 2016-09-13 ENCOUNTER — Ambulatory Visit: Payer: Medicare Other | Admitting: Hematology and Oncology

## 2016-09-13 ENCOUNTER — Ambulatory Visit: Payer: Medicare Other

## 2016-09-13 ENCOUNTER — Other Ambulatory Visit: Payer: Medicare Other

## 2016-09-15 ENCOUNTER — Telehealth: Payer: Self-pay | Admitting: Interventional Cardiology

## 2016-09-15 NOTE — Telephone Encounter (Signed)
Mr.Parkin passed away on February 16,2018 per Mrs. Bobbye [place] .

## 2016-09-20 ENCOUNTER — Ambulatory Visit: Payer: Medicare Other | Admitting: Interventional Cardiology

## 2016-09-22 NOTE — Progress Notes (Signed)
Opened in error; Disregard.

## 2016-10-18 ENCOUNTER — Other Ambulatory Visit: Payer: Medicare Other

## 2016-10-21 ENCOUNTER — Other Ambulatory Visit: Payer: Self-pay | Admitting: Nurse Practitioner

## 2016-11-01 ENCOUNTER — Other Ambulatory Visit: Payer: Medicare Other

## 2016-11-15 ENCOUNTER — Other Ambulatory Visit: Payer: Medicare Other

## 2016-11-29 ENCOUNTER — Other Ambulatory Visit: Payer: Medicare Other

## 2016-12-13 ENCOUNTER — Other Ambulatory Visit: Payer: Medicare Other

## 2016-12-27 ENCOUNTER — Other Ambulatory Visit: Payer: Medicare Other

## 2017-01-10 ENCOUNTER — Other Ambulatory Visit: Payer: Medicare Other

## 2017-01-24 ENCOUNTER — Other Ambulatory Visit: Payer: Medicare Other

## 2017-02-07 ENCOUNTER — Other Ambulatory Visit: Payer: Medicare Other

## 2017-07-11 IMAGING — CR DG CHEST 1V PORT
1 series · 1 of 1 positions shown · non-contrast
Comparison: January 16, 2016

ADDENDUM:
The 2 lead pacemaker has been removed.
CLINICAL DATA: Right IJ placement

EXAM:
PORTABLE CHEST 1 VIEW

[AP]
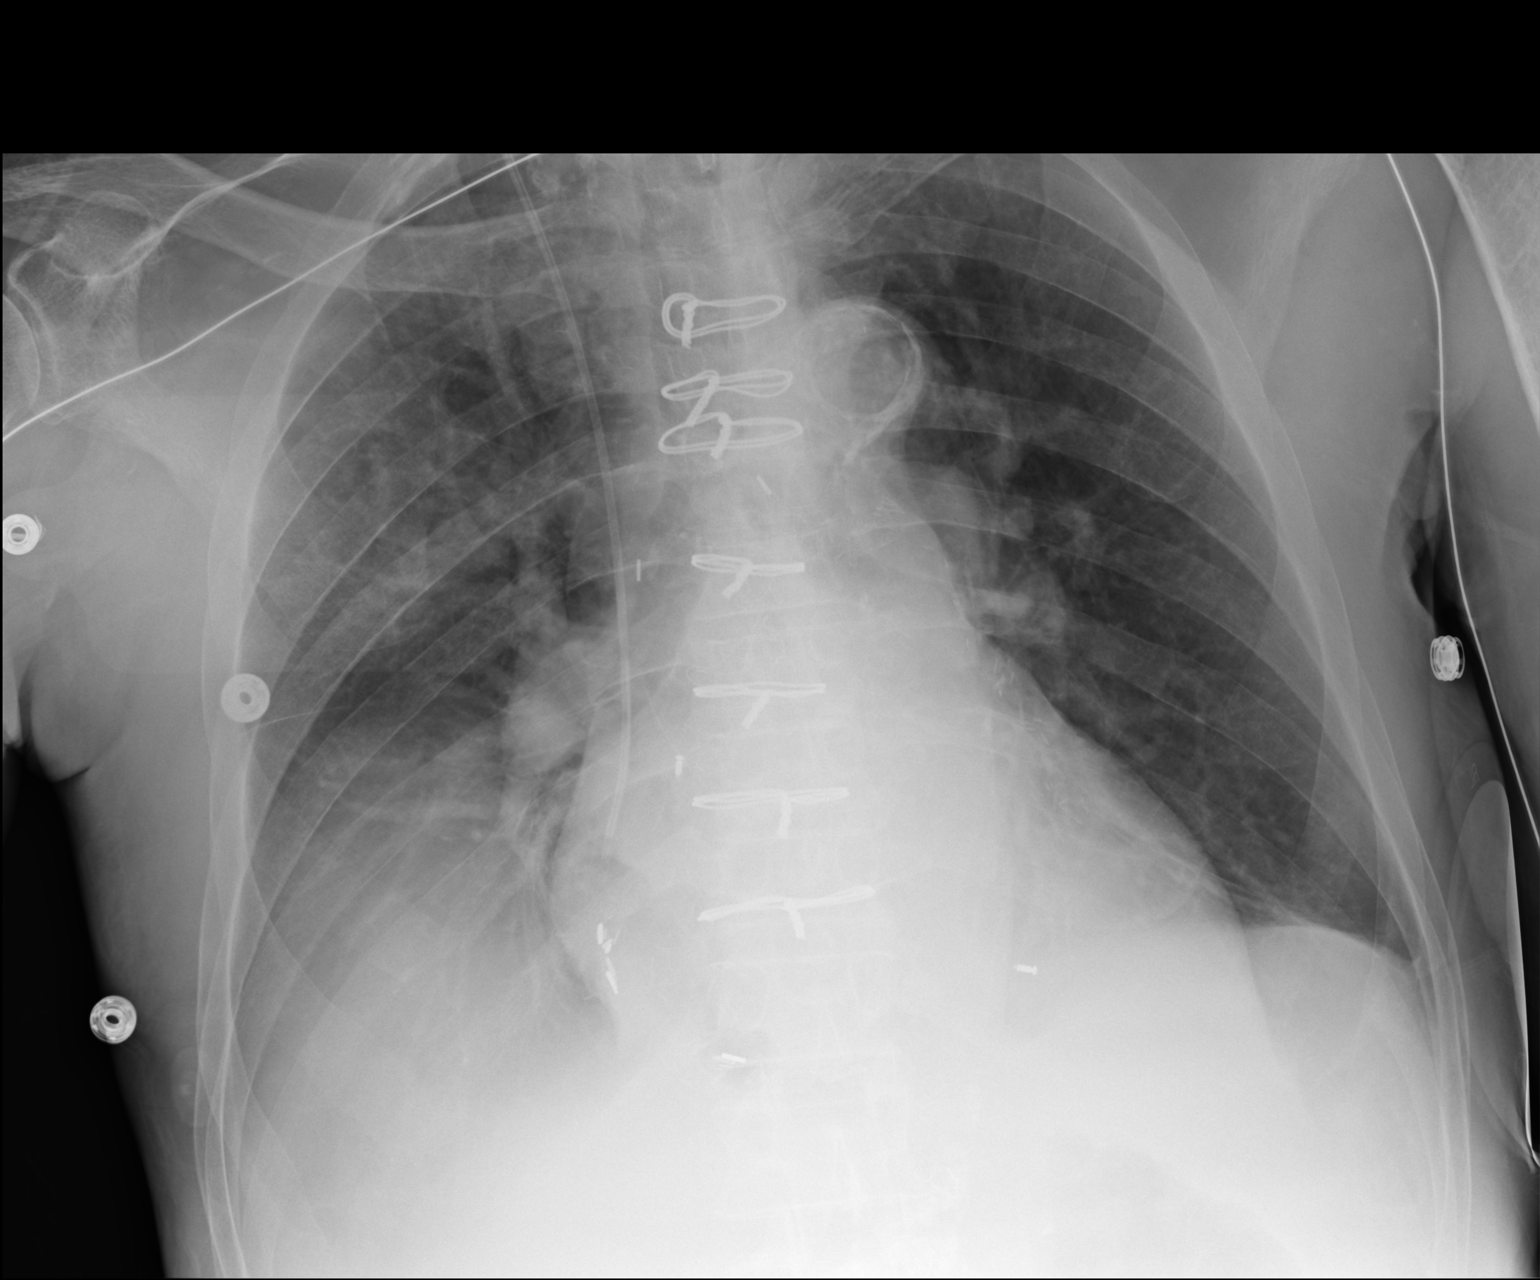

[1 of 1 positions shown; findings below may reference images not displayed]

FINDINGS: A right IJ has been placed in the interval, probably terminating
just within the right side of the atrium, approximately 2 cm
inferior to the caval atrial junction. No pneumothorax. Opacity
remains in the right mid lung, less focal in the interval. Opacity
and effusion remains in the left lung base, also less focal in the
interval. No other interval changes.
IMPRESSION: 1. The distal tip of the new right IJ probably terminates
approximately 2 cm below the caval atrial junction, just within the
right side of the atrium. Recommend repositioning as clinically
warranted. No pneumothorax.
2. The right-sided pulmonary infiltrates are less focal in the
interval but remain. There is probably a small amount of layering
effusion on the right as well.

## 2017-07-12 IMAGING — CR DG CHEST 1V PORT
2 series · 2 of 2 positions shown · non-contrast
Comparison: Portable chest x-ray January 27, 2016

CLINICAL DATA: Weakness, infected pacemaker

EXAM:
PORTABLE CHEST 1 VIEW

[AP (1 of 2)]
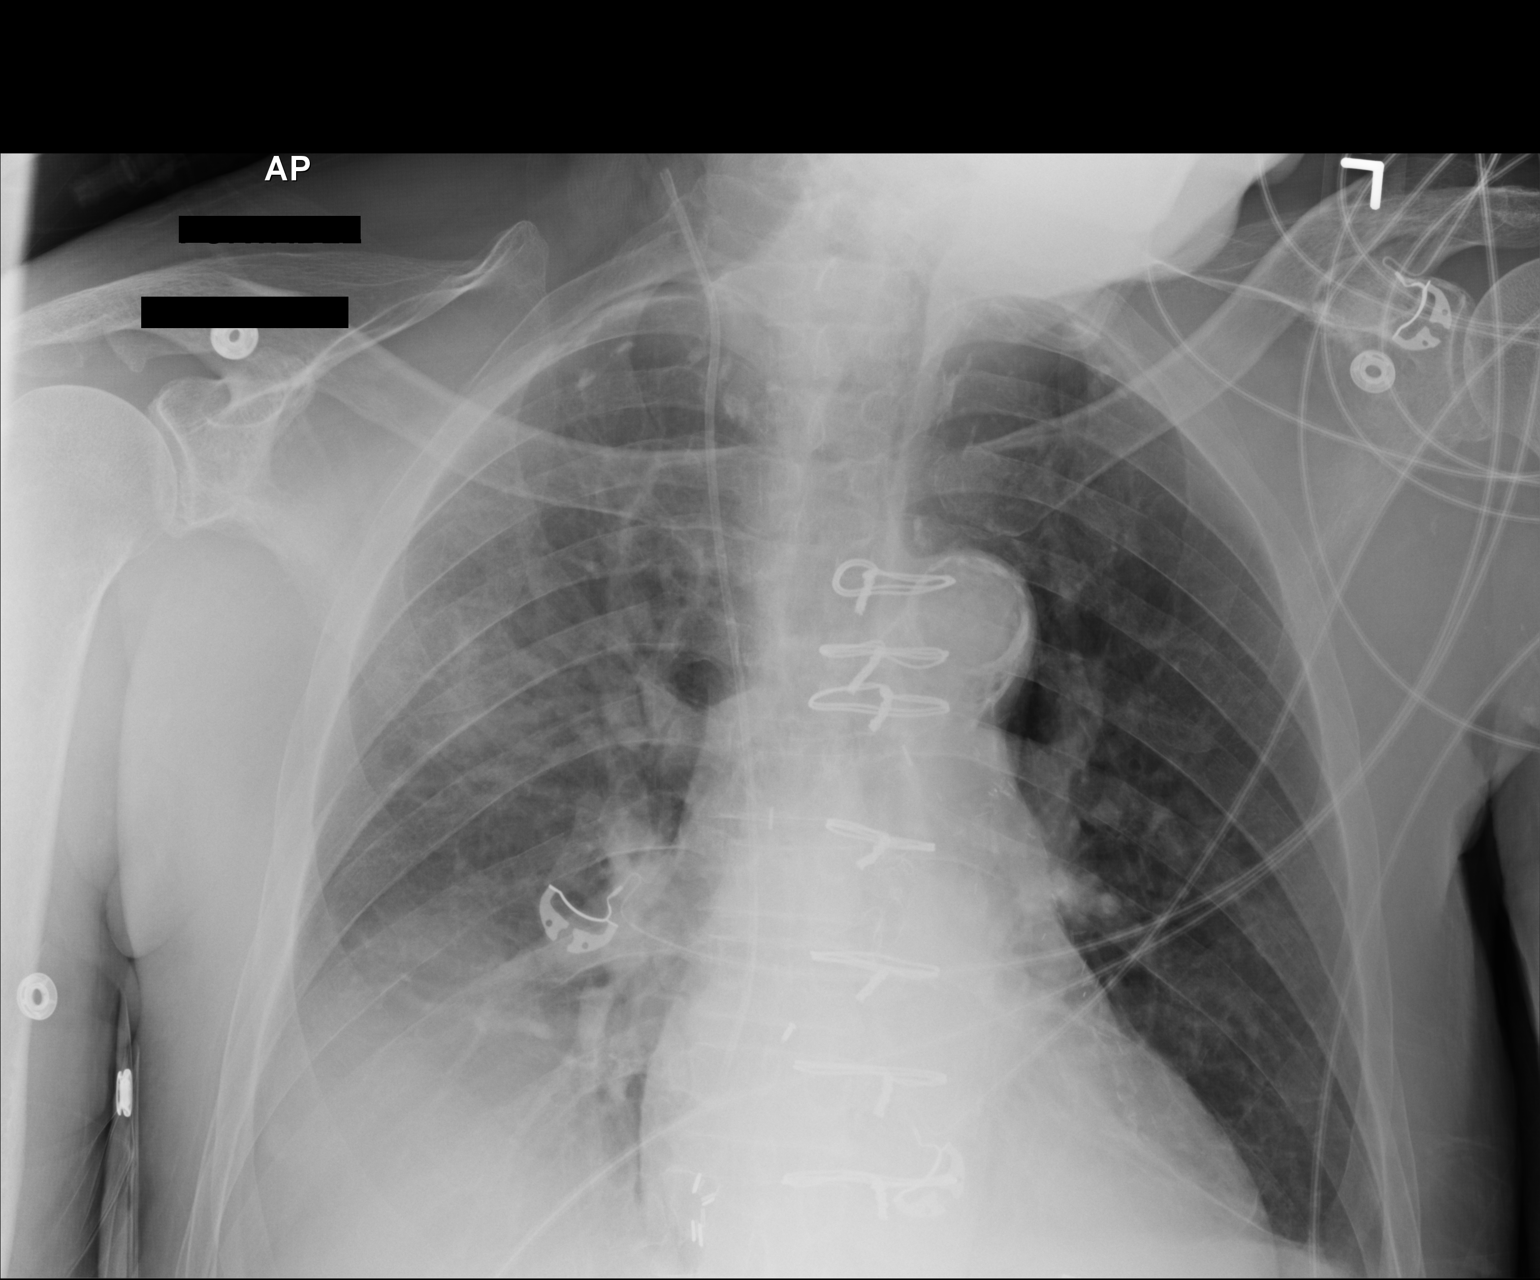

[AP (2 of 2)]
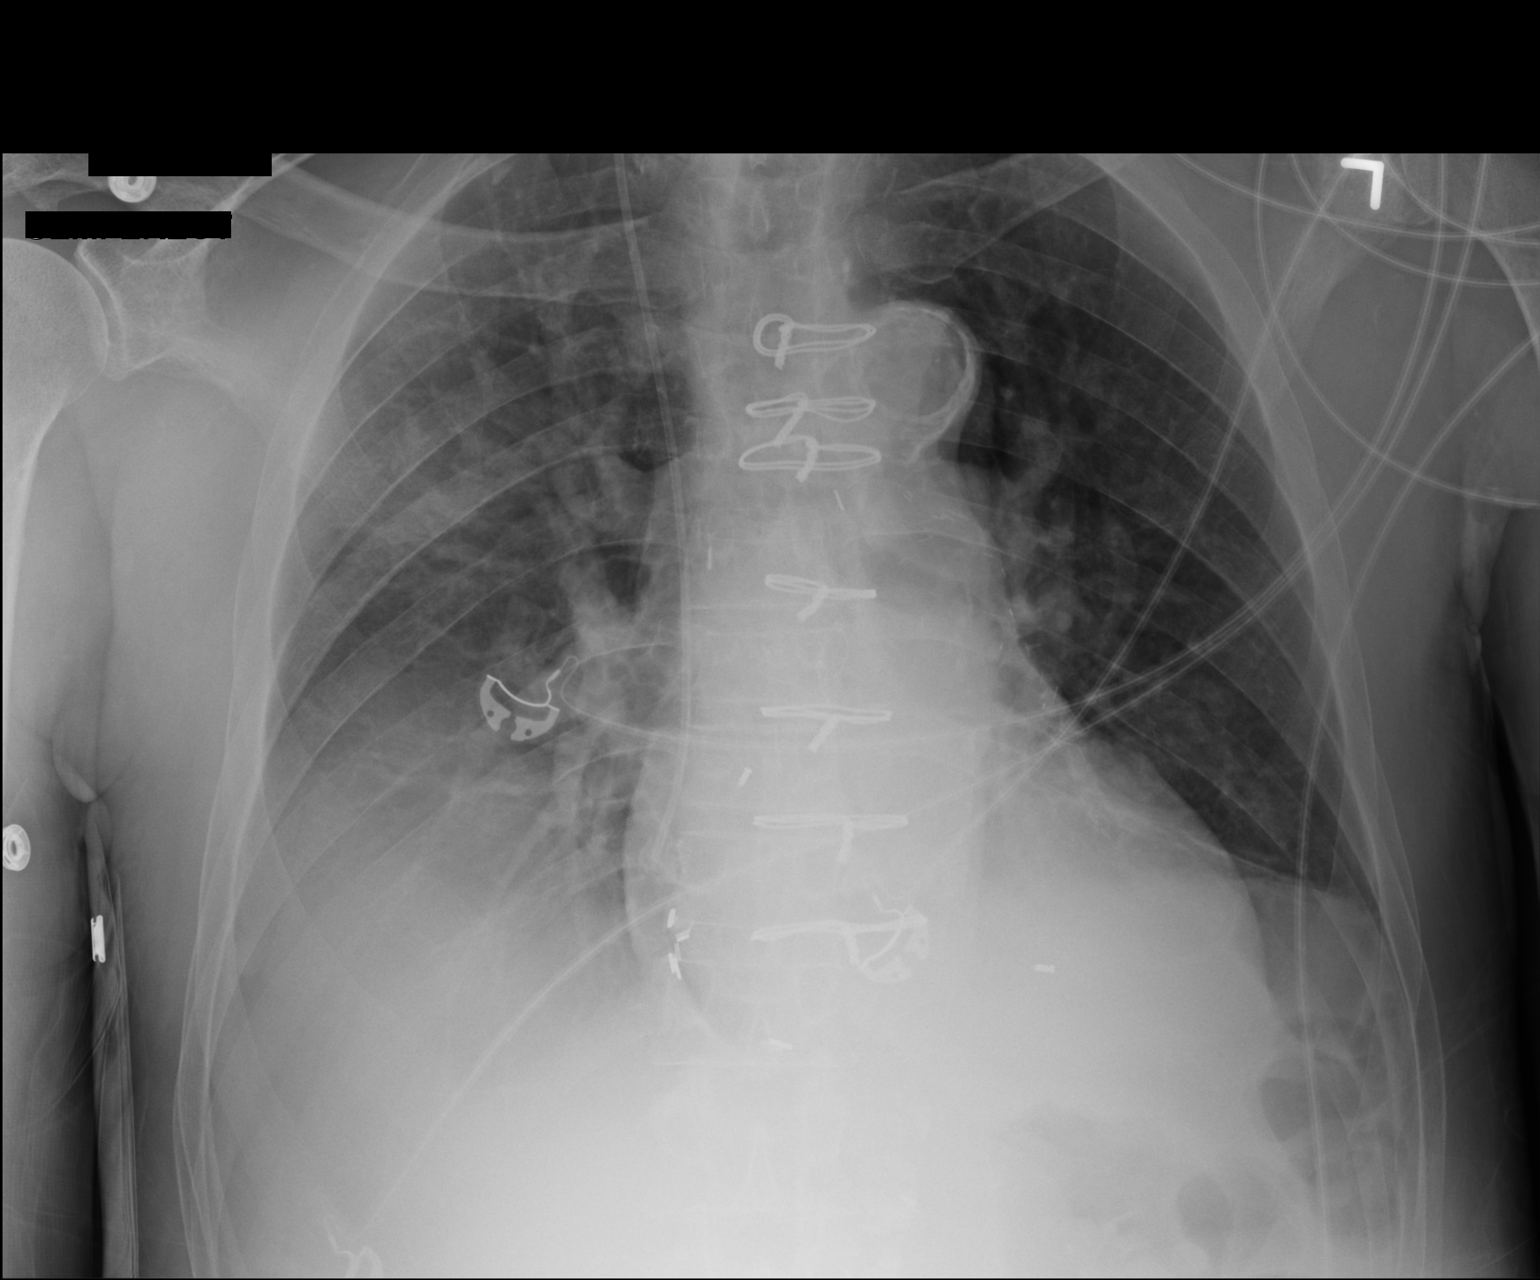

[2 of 2 positions shown; findings below may reference images not displayed]

FINDINGS: The left lung is well-expanded and clear. On the right there is mild
volume loss with increased density in the upper and lower lobes
which is stable. There is no pneumothorax. There is biapical pleural
thickening the heart is normal in size. There is dense calcification
in the aortic arch. The sternal wires are intact. The right internal
jugular venous catheter tip projects over the distal third of the
SVC just above the cavoatrial junction.
IMPRESSION: 1. Persistent infiltrate in the right upper and lower lobes. Small
right pleural effusion. No definite pneumothorax.
2. The left lung is clear.  No CHF.  Aortic atherosclerosis.

## 2017-09-09 IMAGING — US US RENAL
1 series · 14 of 25 positions shown · non-contrast
Comparison: CT 08/14/2016, 06/30/2016

CLINICAL DATA: Acute renal failure

EXAM:
RENAL / URINARY TRACT ULTRASOUND COMPLETE

[Series 1: us renal · 0.23mm/px · 14 of 36 slices shown]
[im 1/36]
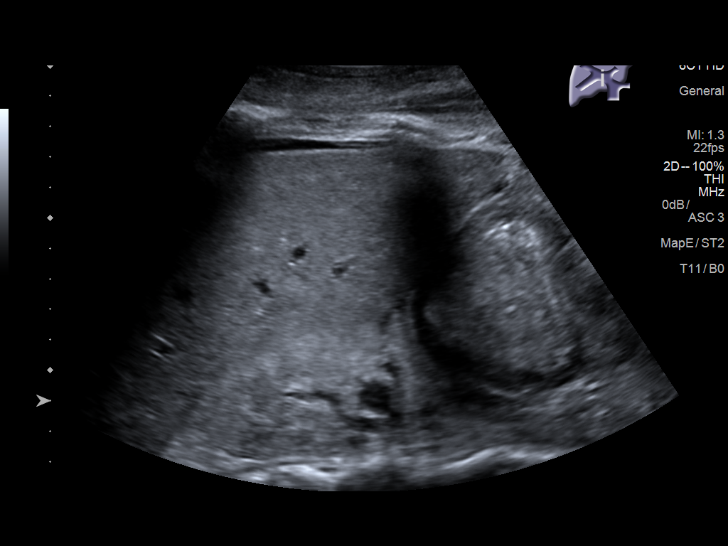
[im 3/36]
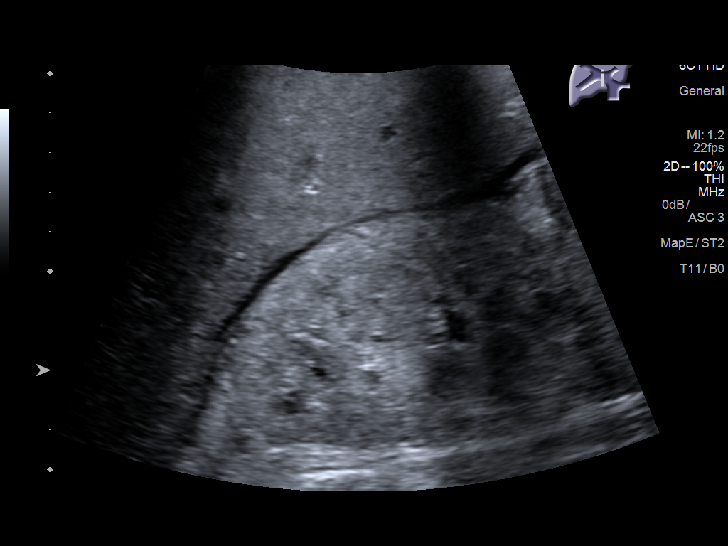
[im 6/36]
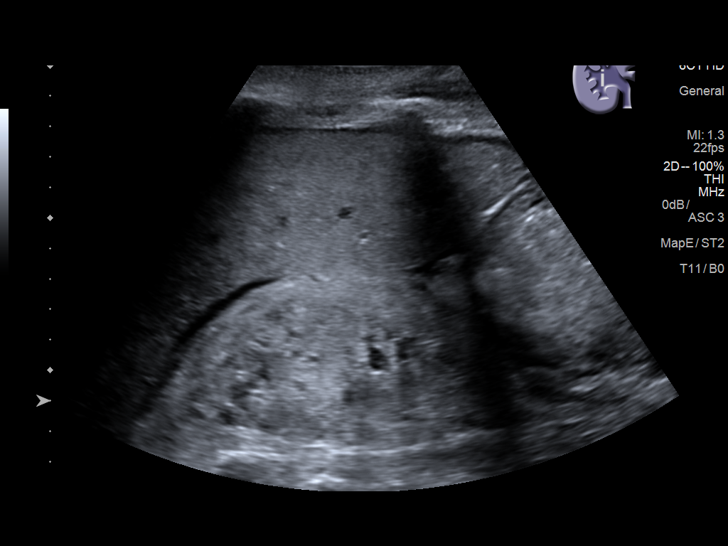
[im 9/36]
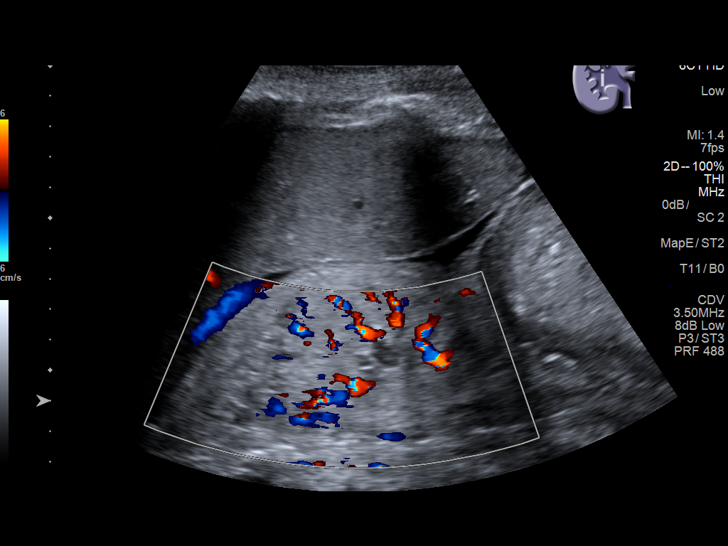
[im 12/36]
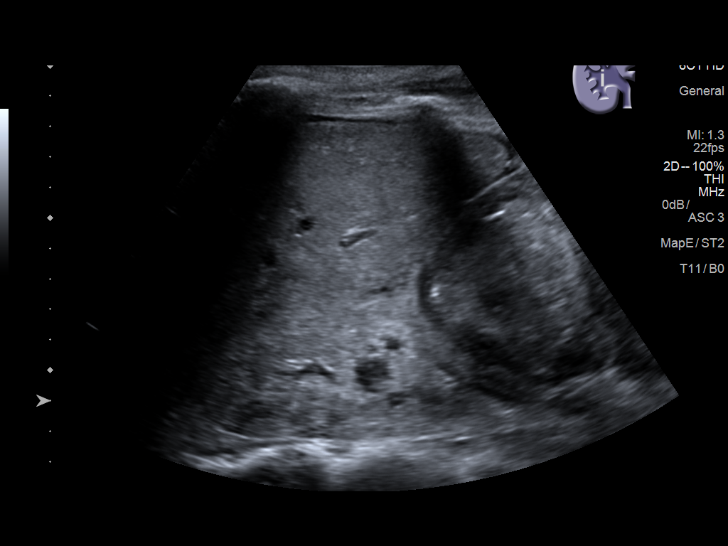
[im 14/36]
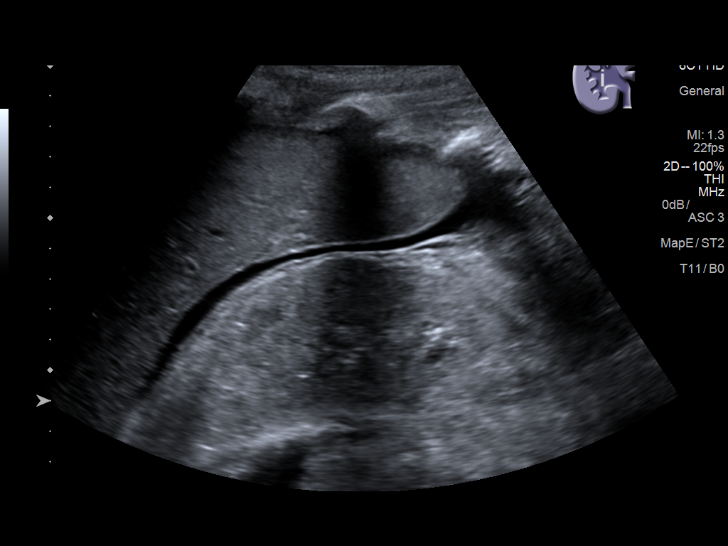
[im 17/36]
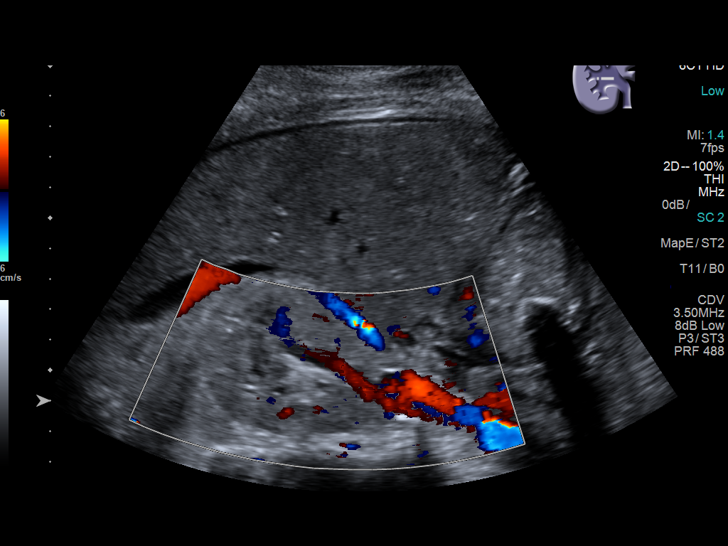
[im 19/36]
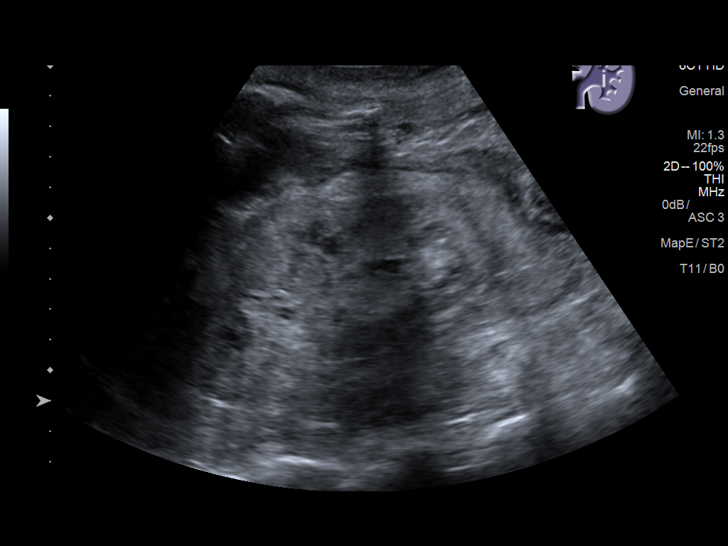
[im 22/36]
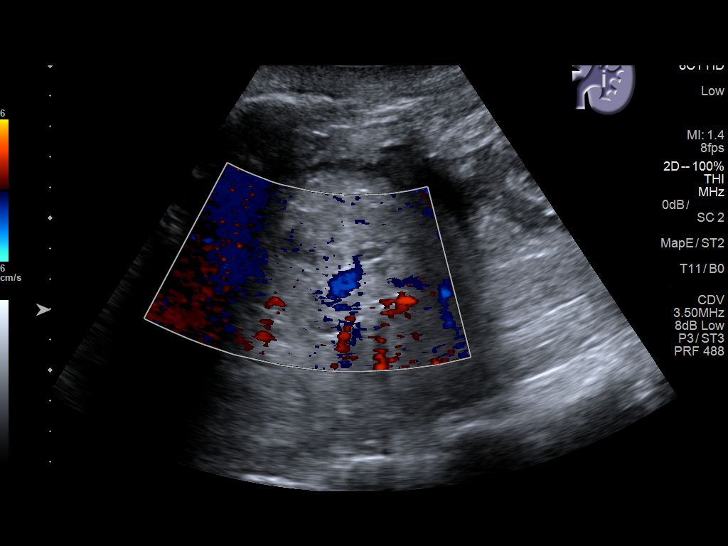
[im 24/36]
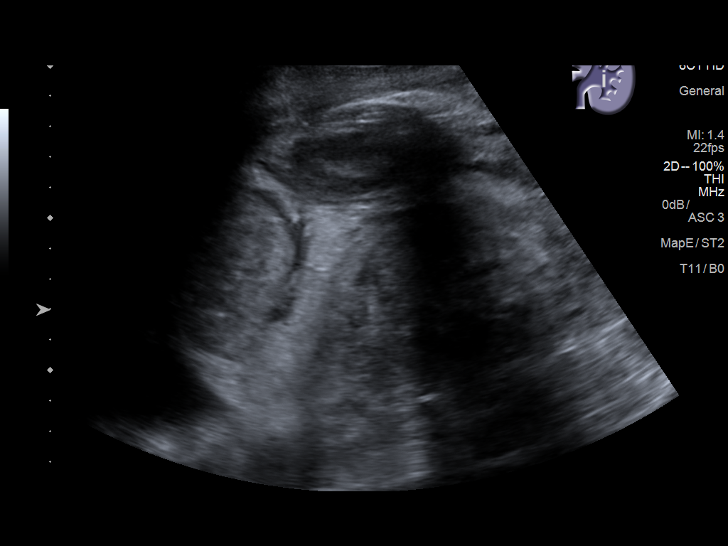
[im 27/36]
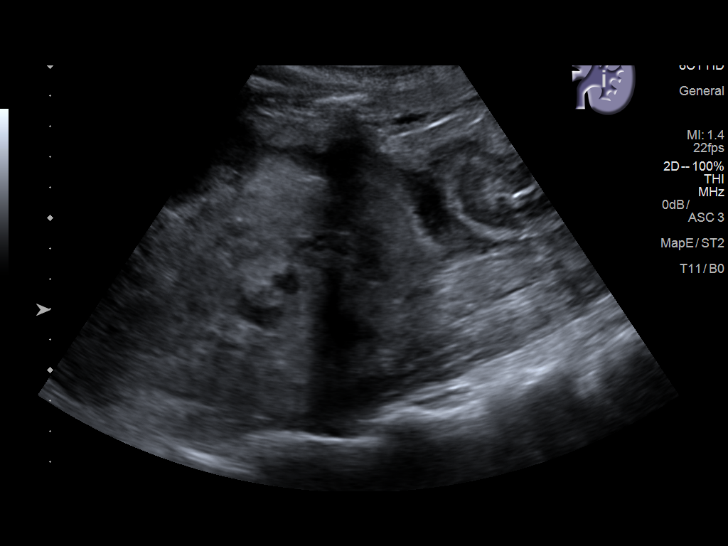
[im 30/36]
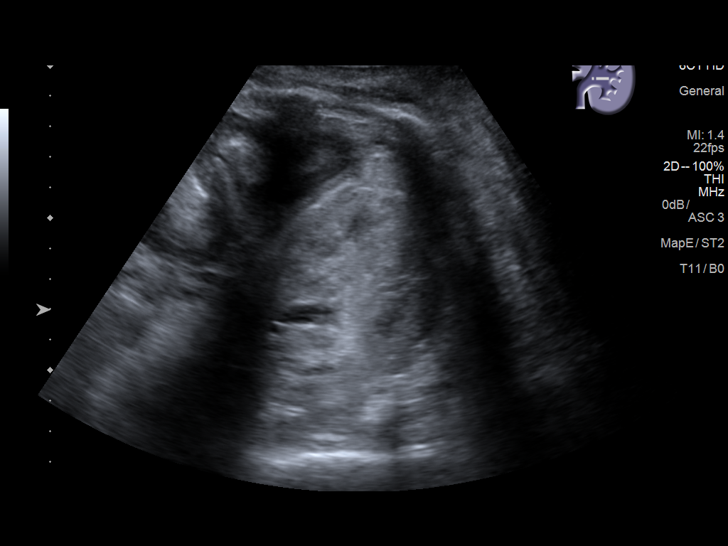
[im 33/36]
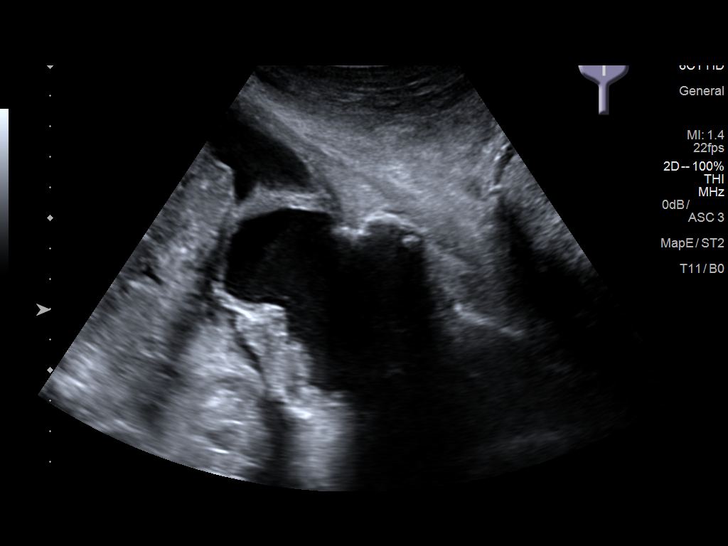
[im 36/36]
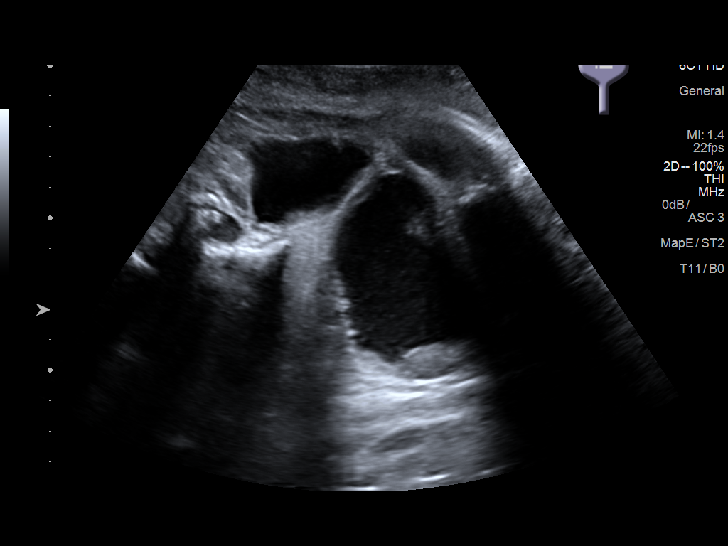

[14 of 25 positions shown; findings below may reference images not displayed]

FINDINGS: Right Kidney:

Length: 11.1 cm. Diffuse increased echogenicity. No hydronephrosis.
The previously noted cyst is not well identified on the current exam

Left Kidney:

Length: 10.9 cm. Diffuse increased echogenicity. No hydronephrosis.

Bladder:

Appears normal for degree of bladder distention. Incidentally noted
is a small amount of abdominal ascites
IMPRESSION: 1. Echogenic kidneys consistent with medical renal disease. No
hydronephrosis.
2. Small volume of abdominal ascites

## 2018-01-30 IMAGING — CR DG CHEST 1V
1 series · 1 of 1 positions shown · non-contrast
Comparison: 08/14/2016

CLINICAL DATA: Acute renal failure

EXAM:
CHEST 1 VIEW

[chest ap]
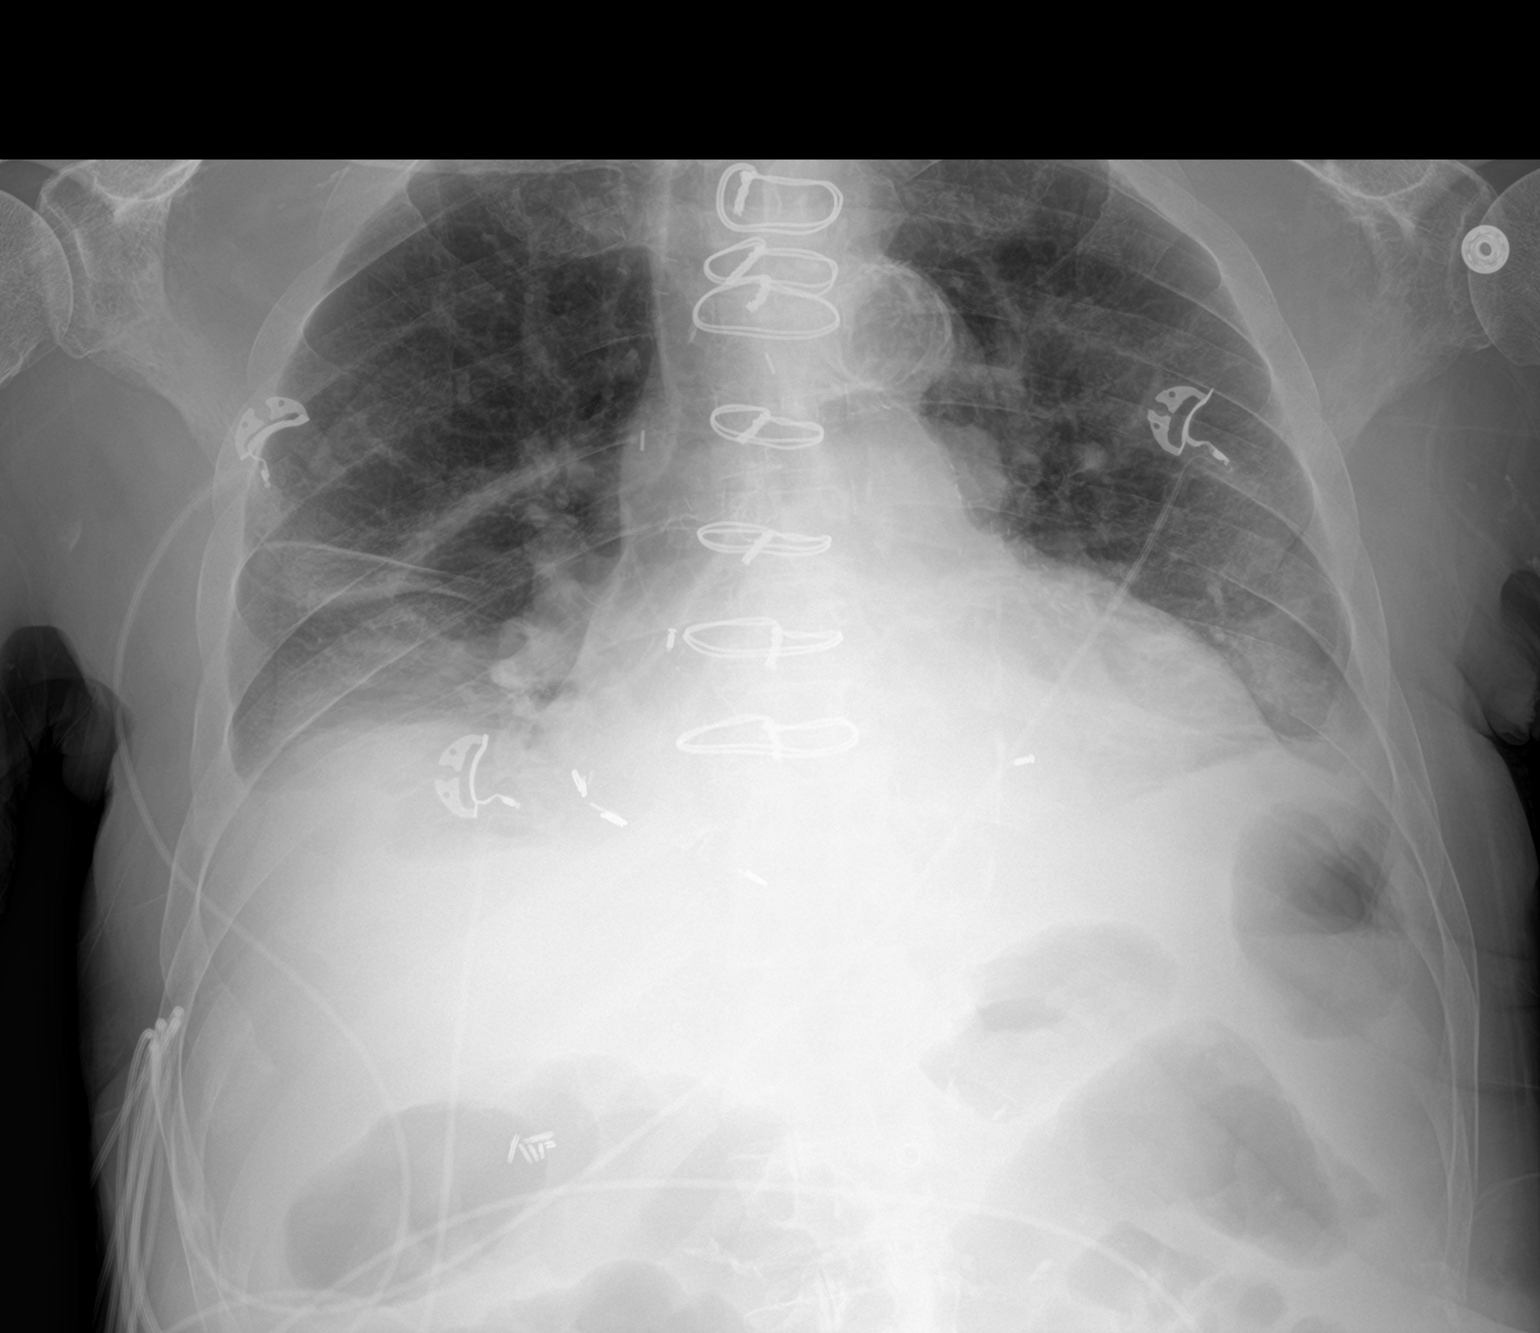

[1 of 1 positions shown; findings below may reference images not displayed]

FINDINGS: Post sternotomy changes are again visualized. There is mild
cardiomegaly. Development of small bilateral effusions and hazy
bibasilar atelectasis or infiltrate. Atherosclerosis. No
pneumothorax.
IMPRESSION: 1. Development of small bilateral pleural effusions with increased
hazy bibasilar atelectasis or infiltrate
2. Cardiomegaly
